# Patient Record
Sex: Female | Born: 1945
Health system: Southern US, Community
[De-identification: ages and names within clinical notes are randomized; demographics above are authoritative.]

## PROBLEM LIST (undated history)

## (undated) DIAGNOSIS — F1910 Other psychoactive substance abuse, uncomplicated: Secondary | ICD-10-CM

## (undated) DIAGNOSIS — F1021 Alcohol dependence, in remission: Secondary | ICD-10-CM

## (undated) DIAGNOSIS — Z803 Family history of malignant neoplasm of breast: Secondary | ICD-10-CM

## (undated) DIAGNOSIS — Z923 Personal history of irradiation: Secondary | ICD-10-CM

## (undated) DIAGNOSIS — I10 Essential (primary) hypertension: Secondary | ICD-10-CM

## (undated) DIAGNOSIS — F32A Depression, unspecified: Secondary | ICD-10-CM

## (undated) DIAGNOSIS — M719 Bursopathy, unspecified: Secondary | ICD-10-CM

## (undated) DIAGNOSIS — H269 Unspecified cataract: Secondary | ICD-10-CM

## (undated) DIAGNOSIS — R112 Nausea with vomiting, unspecified: Secondary | ICD-10-CM

## (undated) DIAGNOSIS — F419 Anxiety disorder, unspecified: Secondary | ICD-10-CM

## (undated) DIAGNOSIS — G20A1 Parkinson's disease without dyskinesia, without mention of fluctuations: Secondary | ICD-10-CM

## (undated) DIAGNOSIS — G709 Myoneural disorder, unspecified: Secondary | ICD-10-CM

## (undated) DIAGNOSIS — Z8719 Personal history of other diseases of the digestive system: Secondary | ICD-10-CM

## (undated) DIAGNOSIS — T7840XA Allergy, unspecified, initial encounter: Secondary | ICD-10-CM

## (undated) DIAGNOSIS — G20 Parkinson's disease: Secondary | ICD-10-CM

## (undated) DIAGNOSIS — Z801 Family history of malignant neoplasm of trachea, bronchus and lung: Secondary | ICD-10-CM

## (undated) DIAGNOSIS — Z9889 Other specified postprocedural states: Secondary | ICD-10-CM

## (undated) DIAGNOSIS — T8859XA Other complications of anesthesia, initial encounter: Secondary | ICD-10-CM

## (undated) DIAGNOSIS — C50919 Malignant neoplasm of unspecified site of unspecified female breast: Secondary | ICD-10-CM

## (undated) DIAGNOSIS — T4145XA Adverse effect of unspecified anesthetic, initial encounter: Secondary | ICD-10-CM

## (undated) DIAGNOSIS — F329 Major depressive disorder, single episode, unspecified: Secondary | ICD-10-CM

## (undated) DIAGNOSIS — Z8659 Personal history of other mental and behavioral disorders: Secondary | ICD-10-CM

## (undated) HISTORY — DX: Major depressive disorder, single episode, unspecified: F32.9

## (undated) HISTORY — DX: Essential (primary) hypertension: I10

## (undated) HISTORY — DX: Family history of malignant neoplasm of trachea, bronchus and lung: Z80.1

## (undated) HISTORY — DX: Other psychoactive substance abuse, uncomplicated: F19.10

## (undated) HISTORY — DX: Parkinson's disease: G20

## (undated) HISTORY — DX: Depression, unspecified: F32.A

## (undated) HISTORY — DX: Parkinson's disease without dyskinesia, without mention of fluctuations: G20.A1

## (undated) HISTORY — DX: Personal history of other mental and behavioral disorders: Z86.59

## (undated) HISTORY — DX: Myoneural disorder, unspecified: G70.9

## (undated) HISTORY — DX: Family history of malignant neoplasm of breast: Z80.3

## (undated) HISTORY — DX: Unspecified cataract: H26.9

## (undated) HISTORY — DX: Alcohol dependence, in remission: F10.21

## (undated) HISTORY — DX: Allergy, unspecified, initial encounter: T78.40XA

## (undated) HISTORY — PX: OVARIAN CYST REMOVAL: SHX89

## (undated) HISTORY — PX: TONSILLECTOMY: SUR1361

---
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
## 2005-11-02 DIAGNOSIS — F1021 Alcohol dependence, in remission: Secondary | ICD-10-CM

## 2005-11-02 HISTORY — DX: Alcohol dependence, in remission: F10.21

## 2010-12-05 LAB — HM PAP SMEAR: HM Pap smear: NEGATIVE

## 2012-11-02 DIAGNOSIS — Z8659 Personal history of other mental and behavioral disorders: Secondary | ICD-10-CM

## 2012-11-02 HISTORY — DX: Personal history of other mental and behavioral disorders: Z86.59

## 2015-06-21 LAB — HM PAP SMEAR: HM Pap smear: NEGATIVE

## 2015-06-25 LAB — TSH: TSH: 3.52 (ref 0.41–5.90)

## 2017-06-02 LAB — LIPID PANEL
Cholesterol: 168 (ref 0–200)
HDL: 47 (ref 35–70)
LDL Cholesterol: 103
Triglycerides: 88 (ref 40–160)

## 2017-06-02 LAB — CBC AND DIFFERENTIAL
HCT: 43 (ref 36–46)
Hemoglobin: 14.8 (ref 12.0–16.0)
Platelets: 210 (ref 150–399)
WBC: 5.5

## 2017-06-02 LAB — BASIC METABOLIC PANEL WITH GFR
BUN: 95 — AB (ref 4–21)
Creatinine: 0.8 (ref 0.5–1.1)
Glucose: 95
Potassium: 3.7 (ref 3.4–5.3)
Sodium: 138 (ref 137–147)

## 2017-06-02 LAB — TSH: TSH: 3.51 (ref 0.41–5.90)

## 2017-06-02 LAB — HEPATIC FUNCTION PANEL
Alkaline Phosphatase: 45 (ref 25–125)
Bilirubin, Total: 0.7

## 2017-06-28 LAB — COLOGUARD: Cologuard: NEGATIVE

## 2018-01-26 ENCOUNTER — Other Ambulatory Visit: Payer: Self-pay | Admitting: Internal Medicine

## 2018-01-26 DIAGNOSIS — R1900 Intra-abdominal and pelvic swelling, mass and lump, unspecified site: Secondary | ICD-10-CM

## 2018-01-31 HISTORY — PX: MASS EXCISION: SHX2000

## 2018-02-03 ENCOUNTER — Ambulatory Visit
Admission: RE | Admit: 2018-02-03 | Discharge: 2018-02-03 | Disposition: A | Discharge status: Home / Self Care | Payer: Medicare Other | Source: Ambulatory Visit | Attending: Internal Medicine | Admitting: Internal Medicine

## 2018-02-03 DIAGNOSIS — R1900 Intra-abdominal and pelvic swelling, mass and lump, unspecified site: Secondary | ICD-10-CM

## 2018-02-03 MED ORDER — IOPAMIDOL (ISOVUE-300) INJECTION 61%
100.0000 mL | Freq: Once | INTRAVENOUS | Status: AC | PRN
  Administered 2018-02-03: 100 mL via INTRAVENOUS

## 2018-02-04 ENCOUNTER — Telehealth: Payer: Self-pay | Admitting: *Deleted

## 2018-02-04 NOTE — Telephone Encounter (Signed)
Called and spoke with the patient, scheduled an appt for Monday at 3pm

## 2018-02-07 ENCOUNTER — Encounter: Payer: Self-pay | Admitting: Obstetrics

## 2018-02-07 ENCOUNTER — Inpatient Hospital Stay: Payer: Medicare Other | Attending: Obstetrics | Admitting: Obstetrics

## 2018-02-07 ENCOUNTER — Other Ambulatory Visit: Payer: Self-pay | Admitting: Obstetrics & Gynecology

## 2018-02-07 DIAGNOSIS — R6881 Early satiety: Secondary | ICD-10-CM | POA: Diagnosis not present

## 2018-02-07 DIAGNOSIS — N6310 Unspecified lump in the right breast, unspecified quadrant: Secondary | ICD-10-CM | POA: Insufficient documentation

## 2018-02-07 DIAGNOSIS — R1900 Intra-abdominal and pelvic swelling, mass and lump, unspecified site: Secondary | ICD-10-CM | POA: Insufficient documentation

## 2018-02-07 DIAGNOSIS — K5900 Constipation, unspecified: Secondary | ICD-10-CM | POA: Insufficient documentation

## 2018-02-07 DIAGNOSIS — Z79899 Other long term (current) drug therapy: Secondary | ICD-10-CM | POA: Insufficient documentation

## 2018-02-07 DIAGNOSIS — F1021 Alcohol dependence, in remission: Secondary | ICD-10-CM | POA: Insufficient documentation

## 2018-02-07 DIAGNOSIS — Z8659 Personal history of other mental and behavioral disorders: Secondary | ICD-10-CM | POA: Insufficient documentation

## 2018-02-07 DIAGNOSIS — Z87898 Personal history of other specified conditions: Secondary | ICD-10-CM | POA: Insufficient documentation

## 2018-02-07 DIAGNOSIS — R978 Other abnormal tumor markers: Secondary | ICD-10-CM | POA: Insufficient documentation

## 2018-02-07 DIAGNOSIS — N83201 Unspecified ovarian cyst, right side: Secondary | ICD-10-CM | POA: Insufficient documentation

## 2018-02-07 NOTE — Patient Instructions (Signed)
Preparing for your Surgery  Plan for surgery on February 10, 2018 with Dr. Precious Haws at [facility] will be scheduled for an exploratory laparotomy, bilateral salpingo-oophorectomy, possible total abdominal hysterectomy, possible staging, possible appendectomy.  Pre-operative Testing -You will receive a phone call from presurgical testing at [facility] to arrange for a pre-operative testing appointment before your surgery.  This appointment normally occurs one to two weeks before your scheduled surgery.    -Bring your insurance card, copy of an advanced directive if applicable, medication list  -At that visit, you will be asked to sign a consent for a possible blood transfusion in case a transfusion becomes necessary during surgery.  The need for a blood transfusion is rare but having consent is a necessary part of your care.     -You should not be taking blood thinners or aspirin at least ten days prior to surgery unless instructed by your surgeon.  Day Before Surgery at [place] will be asked to take in a light diet the day before surgery.  Avoid carbonated beverages.  You will be advised to have nothing to eat or drink after midnight the evening before.    Eat a light diet the day before surgery.  Examples including soups, broths, toast, yogurt, mashed potatoes.  Things to avoid include carbonated beverages (fizzy beverages), raw fruits and raw vegetables, or beans.   If your bowels are filled with gas, your surgeon will have difficulty visualizing your pelvic organs which increases your surgical risks.  Your role in recovery Your role is to become active as soon as directed by your doctor, while still giving yourself time to heal.  Rest when you feel tired. You will be asked to do the following in order to speed your recovery:  - Cough and breathe deeply. This helps toclear and expand your lungs and can prevent pneumonia. You may be given a  spirometer to practice deep breathing. A staff member will show you how to use the spirometer. - Do mild physical activity. Walking or moving your legs help your circulation and body functions return to normal. A staff member will help you when you try to walk and will provide you with simple exercises. Do not try to get up or walk alone the first time. - Actively manage your pain. Managing your pain lets you move in comfort. We will ask you to rate your pain on a scale of zero to 10. It is your responsibility to tell your doctor or nurse where and how much you hurt so your pain can be treated.  Special Considerations -If you are diabetic, you may be placed on insulin after surgery to have closer control over your blood sugars to promote healing and recovery.  This does not mean that you will be discharged on insulin.  If applicable, your oral antidiabetics will be resumed when you are tolerating a solid diet.  -Your final pathology results from surgery should be available by the Friday after surgery and the results will be relayed to you when available.  -Dr. Lahoma Crocker is the Surgeon that assists your GYN Oncologist with surgery.  The next day after your surgery you will either see your GYN Oncologist or Dr. Lahoma Crocker.   Blood Transfusion Information WHAT IS A BLOOD TRANSFUSION? A transfusion is the replacement of blood or some of its parts. Blood is made up of multiple cells which provide different functions.  Red blood cells carry oxygen and are  used for blood loss replacement.  White blood cells fight against infection.  Platelets control bleeding.  Plasma helps clot blood.  Other blood products are available for specialized needs, such as hemophilia or other clotting disorders. BEFORE THE TRANSFUSION  Who gives blood for transfusions?   You may be able to donate blood to be used at a later date on yourself (autologous donation).  Relatives can be asked to donate  blood. This is generally not any safer than if you have received blood from a stranger. The same precautions are taken to ensure safety when a relative's blood is donated.  Healthy volunteers who are fully evaluated to make sure their blood is safe. This is blood bank blood. Transfusion therapy is the safest it has ever been in the practice of medicine. Before blood is taken from a donor, a complete history is taken to make sure that person has no history of diseases nor engages in risky social behavior (examples are intravenous drug use or sexual activity with multiple partners). The donor's travel history is screened to minimize risk of transmitting infections, such as malaria. The donated blood is tested for signs of infectious diseases, such as HIV and hepatitis. The blood is then tested to be sure it is compatible with you in order to minimize the chance of a transfusion reaction. If you or a relative donates blood, this is often done in anticipation of surgery and is not appropriate for emergency situations. It takes many days to process the donated blood. RISKS AND COMPLICATIONS Although transfusion therapy is very safe and saves many lives, the main dangers of transfusion include:   Getting an infectious disease.  Developing a transfusion reaction. This is an allergic reaction to something in the blood you were given. Every precaution is taken to prevent this. The decision to have a blood transfusion has been considered carefully by your caregiver before blood is given. Blood is not given unless the benefits outweigh the risks.

## 2018-02-07 NOTE — Progress Notes (Signed)
Consult Note: Gyn-Onc  Consult was requested by Dr. Benjie Karvonen for the evaluation of Kendra [name] 72 y.o. female  CC:  Chief Complaint  Patient presents with  . Ovarian Cyst    HPI: Kendra [name]  is a very nice 72 y.o.  P0  She was in her usual state of health until about 3 weeks prior to presentation.  She was previously very active involving herself in yoga and walking but over 3 weeks noticed that this had become a little harder. She has been tiring easier and becoming relatively short of breath with activity.  She also notes that her abdomen was "swelling" and she felt like maybe she had ovarian cyst like she had had in the past.  There is for she followed up with her gynecologist Dr. Lisbeth Renshaw.  Workup by Dr. Lisbeth Renshaw including the exam and imaging along with tumor markers.  She presents with a CT scan and tumor marker results as follows:  CT abdomen pelvis with contrast 02/03/2018 at [facility] imaging revealed a right breast mass 2.6 cm and then an abdominal pelvic mass measuring 28.5 x 17 x 25 cm with enhancing septations and solid nodularity originating within the pelvis and extending into the upper abdomen favored to represent an ovarian mass.  01/26/2018 Ca1 25 equals 48.2 with upper limit normal being 38, [name] 4.7 with normal being up to 4.7 but for non-smoker being less than 3.9.  Also CA 19-9 which was markedly elevated at 322 with upper normal being 35.  She has noted some abdominal pain accompanying her diagnosis noting that this moves mostly on the right side she has occasional right lower quadrant mid right side and right upper quadrant.  She occasionally feels discomfort mid epigastric mostly after she eats.  She notices that the pain is worsened by position.  She has had some constipation and early satiety along with some reflux.  She claims to have a good appetite but is not eating as much due to the discomfort from the large mass.  She denies nausea and vomiting.  On her paperwork  she wrote chest pain but when I asked her where that is physically she pointed to her mid upper abdominal region.  Note that she has a history of a psychotic breakdown when she had some issues with her sleep and she has a previous history of alcoholism.  It is been greater than 12 years since she drank alcohol.  She was admitted for a psychotic breakdown 72 years ago and has not had no issues since she has been weaning off of medications to help with this and requires her "pharma GABA" to help her sleep at night currently.  Measurement of disease:  No results for input(s): CA125, CAN125, [name], CA199, ESTRADIOL, INHBB in the last 8760 hours.  Invalid input(s): INHIBINA  . Pending surgical findings   Radiology: . CT abdomen and pelvis as noted above 02/03/2018    Oncologic History: Pending surgical findings    No history exists.    Current Meds:  Outpatient Encounter Medications as of 02/07/2018  Medication Sig  . buPROPion (WELLBUTRIN XL) 300 MG 24 hr tablet Take 300 mg by mouth daily.  Marland Kitchen FLUoxetine (PROZAC) 20 MG capsule Take 20 mg by mouth daily.  Marland Kitchen lisinopril-hydrochlorothiazide (PRINZIDE,ZESTORETIC) 10-12.5 MG tablet Take 1 tablet by mouth daily.  Marland Kitchen Fife GABA sleep med at bedtime for sleep per pt.   No facility-administered encounter medications on file as of 02/07/2018.  Allergy:  Allergies  Allergen Reactions  . Hydroxyzine Anaphylaxis    Tongue swollen    Social Hx:   Social History   Socioeconomic History  . Marital status: Married    Spouse name: Not on file  . Number of children: Not on file  . Years of education: Not on file  . Highest education level: Not on file  Occupational History  . Not on file  Social Needs  . Financial resource strain: Not on file  . Food insecurity:    Worry: Not on file    Inability: Not on file  . Transportation needs:    Medical: Not on file    Non-medical: Not on file  Tobacco Use  .  Smoking status: Never Smoker  . Smokeless tobacco: Never Used  Substance and Sexual Activity  . Alcohol use: Not Currently    Frequency: Never    Comment: alcohol dependence prior to 2007  . Drug use: Never  . Sexual activity: Yes  Lifestyle  . Physical activity:    Days per week: Not on file    Minutes per session: Not on file  . Stress: Not on file  Relationships  . Social connections:    Talks on phone: Not on file    Gets together: Not on file    Attends religious service: Not on file    Active member of club or organization: Not on file    Attends meetings of clubs or organizations: Not on file    Relationship status: Not on file  . Intimate partner violence:    Fear of current or ex partner: Not on file    Emotionally abused: Not on file    Physically abused: Not on file    Forced sexual activity: Not on file  Other Topics Concern  . Not on file  Social History Narrative  . Not on file    Past Surgical Hx:  Past Surgical History:  Procedure Laterality Date  . OVARIAN CYST REMOVAL    . TONSILLECTOMY AND ADENOIDECTOMY  1953    Past Medical Hx:  Past Medical History:  Diagnosis Date  . Depression   . History of alcohol dependence ([place]) 2007   no ETOH; resolved since 2007  . History of psychosis 2014   due to sleep disturbance  . Hypertension     Past Gynecological History:   GYNECOLOGIC HISTORY:  No LMP recorded. Menarche: 72 years old P 0 LMP 72yo HRT none  Claims last normal Pap 2016 and got a phone call. States no abnormal pap smears.  Family Hx:  Family History  Problem Relation Age of Onset  . Diabetes Mother   . Breast cancer Mother 47  . Hypertension Mother   . Lung cancer Father        asbestos related  . Heart disease Brother   . Breast cancer Cousin 103       paternal cousin    Review of Systems:  Review of Systems  Constitutional: Positive for malaise/fatigue.  HENT: Negative.   Eyes: Negative.   Respiratory: Positive for  shortness of breath.   Cardiovascular: Negative.   Gastrointestinal: Positive for constipation and heartburn.  Genitourinary: Negative.   Musculoskeletal: Negative.   Skin: Positive for rash.  Neurological: Negative.   Endo/Heme/Allergies: Negative.   Psychiatric/Behavioral: Negative.    SOB as per HPI. "rash" on right lower abdomen. Neg GYN complaints.  Vitals:  Blood pressure (!) 127/59, pulse 85, temperature 98.4 F (36.9 C), temperature  source Oral, resp. rate 18, height 5\' 6"  (1.676 m), weight 191 lb 14.4 oz (87 kg), SpO2 99 %. Body mass index is 30.97 kg/m.   Physical Exam: ECOG PERFORMANCE STATUS: 1 - Symptomatic but completely ambulatory   General :  Well developed, 72 y.o., female in no apparent distress HEENT:  Normocephalic/atraumatic, symmetric, EOMI, eyelids normal Neck:   Supple, no masses.  Lymphatics:  No cervical/ submandibular/ supraclavicular/ infraclavicular/ inguinal adenopathy Respiratory:  Respirations unlabored, no use of accessory muscles CV:   Deferred Breast:  Deferred Musculoskeletal: No CVA tenderness, normal muscle strength. Abdomen:  Soft, non-tender distended by large mass. No evidence of hernia.  Extremities:  No lymphedema, no erythema, non-tender. Skin:   Petechial type rash right lower quadrant.  Multiple keratoses versus other dermal lesions. Neuro/Psych:  No focal motor deficit, no abnormal mental status. Normal gait. Normal affect. Alert and oriented to person, place, and time  Genito Urinary: Vulva: Normal external female genitalia.  Bladder/urethra: Urethral meatus normal in size and location. No lesions or   masses, well supported bladder Speculum exam: Vagina: No lesion, no discharge, no bleeding. Cervix: Normal appearing, no lesions. Bimanual exam: Some fullness in the cul-de-sac difficult to delineate mass given the large size.  The mass is extending all the way to the abdomen on the abdominal portion of the bimanual exam. Uterus:  Unable to determine size or mobility   Adnexa: No areas very specific nodular lesion can be palpated however again a large mass is palpated on abdominal pelvic exam. Rectovaginal:  Good tone, no impinging masses, no cul de sac nodularity, no parametrial involvement or nodularity.  Oncologic Summary: 1. Pending surgery   Assessment/Plan: 1. Breast mass o I encouraged her to move forward with scheduling workup.  On my review of the imaging this does not appear to be a benign cyst.  I told her to schedule a biopsied about a week after our planned surgery which I am hoping we get her scheduled in 3 days. o I do not think the breast mass has any direct relationship to the ovarian mass other than possible genetics 2. Pelvic abdominal mass o Likely this is adnexal in origin o We discussed possible etiologies including benign, borderline, invasive malignancies o We discussed her elevated tumor markers o Recommendation is for surgical resection in order to provide some relief from the physical symptoms she is experiencing in addition to providing pathologic diagnosis.  Alternative of observation is not recommended nor is she interested. o She did ask me about laparoscopic removal and I recommended we proceed with an abdominal incision explaining why we do not like to drain ovarian masses into the abdominal cavity. o Surgical sketch was reviewed along with the risks including but not limited to bowel bladder vascular ureteral and nerve injury.  o We discussed possible staging and risks associated with lymphadenectomy. o Lastly we discussed the possibility of adjuvant therapy should a malignant diagnosis occur. 3. She will return to see me in approximately 10-14 days to review the pathology 4. I encouraged her to bring her home medication given that her inability to sleep without medication is very important we want to make sure that that is started in the postoperative period so she does not have a  psychotic breakdown. 5. She is to meet with Melissa today to do a brief preoperative visit although I did discuss length of surgery length of stay and basic postoperative activity expectations. 6. She was in the office today with her husband  and both of their questions were answered to the best my ability and they seem satisfied with the plan of care at the end the visit.   Isabel Caprice, MD  02/07/2018, 4:57 PM  Cc: Dr. Evert Kohl. Benjie Karvonen, Referring Ob/Gyn

## 2018-02-07 NOTE — Patient Instructions (Addendum)
Kendra [name]  02/07/2018   Your procedure is scheduled on: 02/10/2018    Report to [facility] Main  Entrance     Report to admitting at 1030 AM   Call this number if you have problems the morning of surgery [phone]   Remember: Do not eat food or drink liquids :After Midnight.    Eat a light diet the day before Surgery.  Examples include: soups, broths, toast, yogurt and mashed potatoes. Things to avoid include: Carbonated Beverages, raw fruits and vegetables and beans.       Take these medicines the morning of surgery with A SIP OF WATER: Wellbutrin, and Prozac                                You may not have any metal on your body including hair pins and              piercings  Do not wear jewelry, make-up, lotions, powders or perfumes, deodorant             Do not wear nail polish.  Do not shave  48 hours prior to surgery.               Do not bring valuables to the hospital. [place].  Contacts, dentures or bridgework may not be worn into surgery.  Leave suitcase in the car. After surgery it may be brought to your room.       Special Instructions: Coughing and Deep breathing exercises, leg exercises              Please read over the following fact sheets you were given: _____________________________________________________________________             [facility] - Preparing for Surgery Before surgery, you can play an important role.  Because skin is not sterile, your skin needs to be as free of germs as possible.  You can reduce the number of germs on your skin by washing with CHG (chlorahexidine gluconate) soap before surgery.  CHG is an antiseptic cleaner which kills germs and bonds with the skin to continue killing germs even after washing. Please DO NOT use if you have an allergy to CHG or antibacterial soaps.  If your skin becomes reddened/irritated stop using the CHG and inform your nurse  when you arrive at Short Stay. Do not shave (including legs and underarms) for at least 48 hours prior to the first CHG shower.  You may shave your face/neck. Please follow these instructions carefully:  1.  Shower with CHG Soap the night before surgery and the  morning of Surgery.  2.  If you choose to wash your hair, wash your hair first as usual with your  normal  shampoo.  3.  After you shampoo, rinse your hair and body thoroughly to remove the  shampoo.                           4.  Use CHG as you would any other liquid soap.  You can apply chg directly  to the skin and wash  Gently with a scrungie or clean washcloth.  5.  Apply the CHG Soap to your body ONLY FROM THE NECK DOWN.   Do not use on face/ open                           Wound or open sores. Avoid contact with eyes, ears mouth and genitals (private parts).                       Wash face,  Genitals (private parts) with your normal soap.             6.  Wash thoroughly, paying special attention to the area where your surgery  will be performed.  7.  Thoroughly rinse your body with warm water from the neck down.  8.  DO NOT shower/wash with your normal soap after using and rinsing off  the CHG Soap.                9.  Pat yourself dry with a clean towel.            10.  Wear clean pajamas.            11.  Place clean sheets on your bed the night of your first shower and do not  sleep with pets. Day of Surgery : Do not apply any lotions/deodorants the morning of surgery.  Please wear clean clothes to the hospital/surgery center.  FAILURE TO FOLLOW THESE INSTRUCTIONS MAY RESULT IN THE CANCELLATION OF YOUR SURGERY PATIENT SIGNATURE_________________________________  NURSE SIGNATURE__________________________________  ________________________________________________________________________  WHAT IS A BLOOD TRANSFUSION? Blood Transfusion Information  A transfusion is the replacement of blood or some of its  parts. Blood is made up of multiple cells which provide different functions.  Red blood cells carry oxygen and are used for blood loss replacement.  White blood cells fight against infection.  Platelets control bleeding.  Plasma helps clot blood.  Other blood products are available for specialized needs, such as hemophilia or other clotting disorders. BEFORE THE TRANSFUSION  Who gives blood for transfusions?   Healthy volunteers who are fully evaluated to make sure their blood is safe. This is blood bank blood. Transfusion therapy is the safest it has ever been in the practice of medicine. Before blood is taken from a donor, a complete history is taken to make sure that person has no history of diseases nor engages in risky social behavior (examples are intravenous drug use or sexual activity with multiple partners). The donor's travel history is screened to minimize risk of transmitting infections, such as malaria. The donated blood is tested for signs of infectious diseases, such as HIV and hepatitis. The blood is then tested to be sure it is compatible with you in order to minimize the chance of a transfusion reaction. If you or a relative donates blood, this is often done in anticipation of surgery and is not appropriate for emergency situations. It takes many days to process the donated blood. RISKS AND COMPLICATIONS Although transfusion therapy is very safe and saves many lives, the main dangers of transfusion include:   Getting an infectious disease.  Developing a transfusion reaction. This is an allergic reaction to something in the blood you were given. Every precaution is taken to prevent this. The decision to have a blood transfusion has been considered carefully by your caregiver before blood is given. Blood is not given unless the benefits outweigh  the risks. AFTER THE TRANSFUSION  Right after receiving a blood transfusion, you will usually feel much better and more energetic.  This is especially true if your red blood cells have gotten low (anemic). The transfusion raises the level of the red blood cells which carry oxygen, and this usually causes an energy increase.  The nurse administering the transfusion will monitor you carefully for complications. HOME CARE INSTRUCTIONS  No special instructions are needed after a transfusion. You may find your energy is better. Speak with your caregiver about any limitations on activity for underlying diseases you may have. SEEK MEDICAL CARE IF:   Your condition is not improving after your transfusion.  You develop redness or irritation at the intravenous (IV) site. SEEK IMMEDIATE MEDICAL CARE IF:  Any of the following symptoms occur over the next 12 hours:  Shaking chills.  You have a temperature by mouth above 102 F (38.9 C), not controlled by medicine.  Chest, back, or muscle pain.  People around you feel you are not acting correctly or are confused.  Shortness of breath or difficulty breathing.  Dizziness and fainting.  You get a rash or develop hives.  You have a decrease in urine output.  Your urine turns a dark color or changes to pink, red, or brown. Any of the following symptoms occur over the next 10 days:  You have a temperature by mouth above 102 F (38.9 C), not controlled by medicine.  Shortness of breath.  Weakness after normal activity.  The white part of the eye turns yellow (jaundice).  You have a decrease in the amount of urine or are urinating less often.  Your urine turns a dark color or changes to pink, red, or brown. Document Released: 10/16/2000 Document Revised: 01/11/2012 Document Reviewed: 06/04/2008 ExitCare Patient Information 2014 [place].  _______________________________________________________________________  Incentive Spirometer  An incentive spirometer is a tool that can help keep your lungs clear and active. This tool measures how well you are filling  your lungs with each breath. Taking long deep breaths may help reverse or decrease the chance of developing breathing (pulmonary) problems (especially infection) following:  A long period of time when you are unable to move or be active. BEFORE THE PROCEDURE   If the spirometer includes an indicator to show your best effort, your nurse or respiratory therapist will set it to a desired goal.  If possible, sit up straight or lean slightly forward. Try not to slouch.  Hold the incentive spirometer in an upright position. INSTRUCTIONS FOR USE  1. Sit on the edge of your bed if possible, or sit up as far as you can in bed or on a chair. 2. Hold the incentive spirometer in an upright position. 3. Breathe out normally. 4. Place the mouthpiece in your mouth and seal your lips tightly around it. 5. Breathe in slowly and as deeply as possible, raising the piston or the ball toward the top of the column. 6. Hold your breath for 3-5 seconds or for as long as possible. Allow the piston or ball to fall to the bottom of the column. 7. Remove the mouthpiece from your mouth and breathe out normally. 8. Rest for a few seconds and repeat Steps 1 through 7 at least 10 times every 1-2 hours when you are awake. Take your time and take a few normal breaths between deep breaths. 9. The spirometer may include an indicator to show your best effort. Use the indicator as a goal to work  toward during each repetition. 10. After each set of 10 deep breaths, practice coughing to be sure your lungs are clear. If you have an incision (the cut made at the time of surgery), support your incision when coughing by placing a pillow or rolled up towels firmly against it. Once you are able to get out of bed, walk around indoors and cough well. You may stop using the incentive spirometer when instructed by your caregiver.  RISKS AND COMPLICATIONS  Take your time so you do not get dizzy or light-headed.  If you are in pain, you may  need to take or ask for pain medication before doing incentive spirometry. It is harder to take a deep breath if you are having pain. AFTER USE  Rest and breathe slowly and easily.  It can be helpful to keep track of a log of your progress. Your caregiver can provide you with a simple table to help with this. If you are using the spirometer at home, follow these instructions: [place] IF:   You are having difficultly using the spirometer.  You have trouble using the spirometer as often as instructed.  Your pain medication is not giving enough relief while using the spirometer.  You develop fever of 100.5 F (38.1 C) or higher. SEEK IMMEDIATE MEDICAL CARE IF:   You cough up bloody sputum that had not been present before.  You develop fever of 102 F (38.9 C) or greater.  You develop worsening pain at or near the incision site. MAKE SURE YOU:   Understand these instructions.  Will watch your condition.  Will get help right away if you are not doing well or get worse. Document Released: 03/01/2007 Document Revised: 01/11/2012 Document Reviewed: 05/02/2007 [facility] Patient Information 2014 [place], [place].   ________________________________________________________________________

## 2018-02-08 ENCOUNTER — Other Ambulatory Visit: Payer: Self-pay

## 2018-02-08 ENCOUNTER — Encounter (HOSPITAL_COMMUNITY)
Admission: RE | Admit: 2018-02-08 | Discharge: 2018-02-08 | Disposition: A | Discharge status: Home / Self Care | Payer: Medicare Other | Source: Ambulatory Visit | Attending: Obstetrics | Admitting: Obstetrics

## 2018-02-08 ENCOUNTER — Ambulatory Visit (HOSPITAL_COMMUNITY)
Admission: RE | Admit: 2018-02-08 | Discharge: 2018-02-08 | Disposition: A | Discharge status: Home / Self Care | Payer: Medicare Other | Source: Ambulatory Visit | Attending: Gynecologic Oncology | Admitting: Gynecologic Oncology

## 2018-02-08 ENCOUNTER — Encounter (HOSPITAL_COMMUNITY): Payer: Self-pay | Admitting: *Deleted

## 2018-02-08 DIAGNOSIS — Z01812 Encounter for preprocedural laboratory examination: Secondary | ICD-10-CM | POA: Insufficient documentation

## 2018-02-08 DIAGNOSIS — Z01810 Encounter for preprocedural cardiovascular examination: Secondary | ICD-10-CM | POA: Insufficient documentation

## 2018-02-08 DIAGNOSIS — K449 Diaphragmatic hernia without obstruction or gangrene: Secondary | ICD-10-CM

## 2018-02-08 DIAGNOSIS — R1900 Intra-abdominal and pelvic swelling, mass and lump, unspecified site: Secondary | ICD-10-CM

## 2018-02-08 DIAGNOSIS — Z01818 Encounter for other preprocedural examination: Secondary | ICD-10-CM

## 2018-02-08 DIAGNOSIS — I10 Essential (primary) hypertension: Secondary | ICD-10-CM

## 2018-02-08 HISTORY — DX: Other specified postprocedural states: Z98.890

## 2018-02-08 HISTORY — DX: Other complications of anesthesia, initial encounter: T88.59XA

## 2018-02-08 HISTORY — DX: Nausea with vomiting, unspecified: R11.2

## 2018-02-08 HISTORY — DX: Adverse effect of unspecified anesthetic, initial encounter: T41.45XA

## 2018-02-08 LAB — CBC WITH DIFFERENTIAL/PLATELET
BASOS ABS: 0 10*3/uL (ref 0.0–0.1)
BASOS PCT: 1 %
Eosinophils Absolute: 0.2 10*3/uL (ref 0.0–0.7)
Eosinophils Relative: 3 %
HEMATOCRIT: 39.6 % (ref 36.0–46.0)
Hemoglobin: 13.2 g/dL (ref 12.0–15.0)
Lymphocytes Relative: 16 %
Lymphs Abs: 1.3 10*3/uL (ref 0.7–4.0)
MCH: 28.9 pg (ref 26.0–34.0)
MCHC: 33.3 g/dL (ref 30.0–36.0)
MCV: 86.7 fL (ref 78.0–100.0)
MONO ABS: 1 10*3/uL (ref 0.1–1.0)
Monocytes Relative: 12 %
NEUTROS ABS: 5.4 10*3/uL (ref 1.7–7.7)
NEUTROS PCT: 68 %
Platelets: 323 10*3/uL (ref 150–400)
RBC: 4.57 MIL/uL (ref 3.87–5.11)
RDW: 13.3 % (ref 11.5–15.5)
WBC: 7.9 10*3/uL (ref 4.0–10.5)

## 2018-02-08 LAB — URINALYSIS, ROUTINE W REFLEX MICROSCOPIC
BILIRUBIN URINE: NEGATIVE
GLUCOSE, UA: NEGATIVE mg/dL
Ketones, ur: NEGATIVE mg/dL
NITRITE: NEGATIVE
PH: 7 (ref 5.0–8.0)
Protein, ur: NEGATIVE mg/dL
RBC / HPF: NONE SEEN RBC/hpf (ref 0–5)
SPECIFIC GRAVITY, URINE: 1.008 (ref 1.005–1.030)

## 2018-02-08 LAB — COMPREHENSIVE METABOLIC PANEL
ALBUMIN: 3.6 g/dL (ref 3.5–5.0)
ALT: 18 U/L (ref 14–54)
AST: 20 U/L (ref 15–41)
Alkaline Phosphatase: 38 U/L (ref 38–126)
Anion gap: 7 (ref 5–15)
BILIRUBIN TOTAL: 0.6 mg/dL (ref 0.3–1.2)
BUN: 8 mg/dL (ref 6–20)
CO2: 25 mmol/L (ref 22–32)
Calcium: 9.2 mg/dL (ref 8.9–10.3)
Chloride: 102 mmol/L (ref 101–111)
Creatinine, Ser: 0.78 mg/dL (ref 0.44–1.00)
GFR calc Af Amer: >60 mL/min (ref >60)
GFR calc non Af Amer: >60 mL/min (ref >60)
GLUCOSE: 86 mg/dL (ref 65–99)
POTASSIUM: 4.1 mmol/L (ref 3.5–5.1)
Sodium: 134 mmol/L — ABNORMAL LOW (ref 135–145)
TOTAL PROTEIN: 6.6 g/dL (ref 6.5–8.1)

## 2018-02-08 LAB — ABO/RH: ABO/RH(D): O NEG

## 2018-02-08 NOTE — Progress Notes (Signed)
02-08-18 UA result routed to Dr. Gerarda Fraction for review.

## 2018-02-09 ENCOUNTER — Telehealth: Payer: Self-pay

## 2018-02-09 NOTE — Telephone Encounter (Signed)
Ms Kendra [name] called to see if she could take ativan 0.5 mg  [employer] GABA for sleep this evening if she needs to. Told her that Joylene John, NP stated that she cannot take the [place] as it could interfere with the anesthesia. If she can do with out the ativan, that would be good.  If she takes the ativan tonight, she needs to let anesthesia know in the am that she took it and the dose. Pt verbalized understanding.

## 2018-02-10 ENCOUNTER — Inpatient Hospital Stay (HOSPITAL_COMMUNITY): Payer: Medicare Other | Admitting: Anesthesiology

## 2018-02-10 ENCOUNTER — Inpatient Hospital Stay (HOSPITAL_COMMUNITY)
Admission: RE | Admit: 2018-02-10 | Discharge: 2018-02-14 | DRG: 742 | Disposition: A | Discharge status: Home / Self Care | Payer: Medicare Other | Source: Ambulatory Visit | Attending: Obstetrics | Admitting: Obstetrics

## 2018-02-10 ENCOUNTER — Encounter (HOSPITAL_COMMUNITY)
Admission: RE | Disposition: A | Discharge status: Home / Self Care | Payer: Self-pay | Source: Ambulatory Visit | Attending: Obstetrics

## 2018-02-10 ENCOUNTER — Encounter (HOSPITAL_COMMUNITY): Payer: Self-pay | Admitting: *Deleted

## 2018-02-10 ENCOUNTER — Other Ambulatory Visit: Payer: Self-pay

## 2018-02-10 DIAGNOSIS — D270 Benign neoplasm of right ovary: Secondary | ICD-10-CM | POA: Diagnosis present

## 2018-02-10 DIAGNOSIS — F329 Major depressive disorder, single episode, unspecified: Secondary | ICD-10-CM | POA: Diagnosis present

## 2018-02-10 DIAGNOSIS — Z79899 Other long term (current) drug therapy: Secondary | ICD-10-CM | POA: Diagnosis not present

## 2018-02-10 DIAGNOSIS — Z6830 Body mass index (BMI) 30.0-30.9, adult: Secondary | ICD-10-CM | POA: Diagnosis not present

## 2018-02-10 DIAGNOSIS — R1900 Intra-abdominal and pelvic swelling, mass and lump, unspecified site: Secondary | ICD-10-CM | POA: Diagnosis present

## 2018-02-10 DIAGNOSIS — Z91013 Allergy to seafood: Secondary | ICD-10-CM

## 2018-02-10 DIAGNOSIS — I10 Essential (primary) hypertension: Secondary | ICD-10-CM | POA: Diagnosis present

## 2018-02-10 DIAGNOSIS — Z888 Allergy status to other drugs, medicaments and biological substances status: Secondary | ICD-10-CM | POA: Diagnosis not present

## 2018-02-10 DIAGNOSIS — E669 Obesity, unspecified: Secondary | ICD-10-CM | POA: Diagnosis present

## 2018-02-10 DIAGNOSIS — E871 Hypo-osmolality and hyponatremia: Secondary | ICD-10-CM | POA: Diagnosis not present

## 2018-02-10 HISTORY — PX: LAPAROTOMY: SHX154

## 2018-02-10 HISTORY — PX: SALPINGOOPHORECTOMY: SHX82

## 2018-02-10 LAB — TYPE AND SCREEN
ABO/RH(D): O NEG
Antibody Screen: NEGATIVE

## 2018-02-10 SURGERY — LAPAROTOMY, EXPLORATORY
Anesthesia: General

## 2018-02-10 MED ORDER — METOCLOPRAMIDE HCL 5 MG/ML IJ SOLN: 10.0000 mg | Freq: Once | INTRAMUSCULAR | Status: DC | PRN

## 2018-02-10 MED ORDER — PROPOFOL 10 MG/ML IV BOLUS
INTRAVENOUS | Status: AC
  Filled 2018-02-10: qty 20

## 2018-02-10 MED ORDER — HYDROMORPHONE HCL 1 MG/ML IJ SOLN: 0.5000 mg | INTRAMUSCULAR | Status: DC | PRN

## 2018-02-10 MED ORDER — BUPIVACAINE HCL 0.25 % IJ SOLN
INTRAMUSCULAR | Status: DC | PRN
Start: 2018-02-10 — End: 2018-02-10
  Administered 2018-02-10: 20 mL

## 2018-02-10 MED ORDER — MEPERIDINE HCL 50 MG/ML IJ SOLN: 6.2500 mg | INTRAMUSCULAR | Status: DC | PRN

## 2018-02-10 MED ORDER — FENTANYL CITRATE (PF) 100 MCG/2ML IJ SOLN
INTRAMUSCULAR | Status: DC | PRN
  Administered 2018-02-10: 50 ug via INTRAVENOUS
  Administered 2018-02-10: 100 ug via INTRAVENOUS
  Administered 2018-02-10 (×3): 50 ug via INTRAVENOUS

## 2018-02-10 MED ORDER — ALBUMIN HUMAN 5 % IV SOLN
INTRAVENOUS | Status: AC
  Filled 2018-02-10: qty 250

## 2018-02-10 MED ORDER — OXYCODONE HCL 5 MG PO TABS
5.0000 mg | ORAL_TABLET | ORAL | Status: DC | PRN
  Administered 2018-02-10 – 2018-02-12 (×6): 5 mg via ORAL
  Filled 2018-02-10 (×7): qty 1

## 2018-02-10 MED ORDER — SENNOSIDES-DOCUSATE SODIUM 8.6-50 MG PO TABS
2.0000 | ORAL_TABLET | Freq: Every day | ORAL | Status: DC
  Administered 2018-02-10 – 2018-02-13 (×4): 2 via ORAL
  Filled 2018-02-10 (×5): qty 2

## 2018-02-10 MED ORDER — SUGAMMADEX SODIUM 200 MG/2ML IV SOLN
INTRAVENOUS | Status: AC
  Filled 2018-02-10: qty 2

## 2018-02-10 MED ORDER — TRAMADOL HCL 50 MG PO TABS: 100.0000 mg | ORAL_TABLET | Freq: Two times a day (BID) | ORAL | Status: DC | PRN

## 2018-02-10 MED ORDER — DEXAMETHASONE SODIUM PHOSPHATE 10 MG/ML IJ SOLN
INTRAMUSCULAR | Status: DC | PRN
  Administered 2018-02-10: 5 mg via INTRAVENOUS

## 2018-02-10 MED ORDER — FLUOXETINE HCL 20 MG PO CAPS
20.0000 mg | ORAL_CAPSULE | Freq: Every day | ORAL | Status: DC
  Administered 2018-02-11 – 2018-02-14 (×4): 20 mg via ORAL
  Filled 2018-02-10 (×4): qty 1

## 2018-02-10 MED ORDER — IBUPROFEN 200 MG PO TABS: 600.0000 mg | ORAL_TABLET | Freq: Four times a day (QID) | ORAL | Status: DC

## 2018-02-10 MED ORDER — BUPIVACAINE HCL (PF) 0.25 % IJ SOLN
INTRAMUSCULAR | Status: AC
  Filled 2018-02-10: qty 30

## 2018-02-10 MED ORDER — 0.9 % SODIUM CHLORIDE (POUR BTL) OPTIME
TOPICAL | Status: DC | PRN
  Administered 2018-02-10: 3000 mL

## 2018-02-10 MED ORDER — FENTANYL CITRATE (PF) 250 MCG/5ML IJ SOLN
INTRAMUSCULAR | Status: AC
Start: 2018-02-10 — End: ?
  Filled 2018-02-10: qty 5

## 2018-02-10 MED ORDER — HYDROMORPHONE HCL 1 MG/ML IJ SOLN
INTRAMUSCULAR | Status: AC
  Administered 2018-02-10: 0.5 mg via INTRAVENOUS
  Filled 2018-02-10: qty 1

## 2018-02-10 MED ORDER — SODIUM CHLORIDE 0.9 % IV SOLN
2.0000 g | INTRAVENOUS | Status: AC
  Administered 2018-02-10: 2 g via INTRAVENOUS
  Filled 2018-02-10: qty 2

## 2018-02-10 MED ORDER — ROCURONIUM BROMIDE 10 MG/ML (PF) SYRINGE
PREFILLED_SYRINGE | INTRAVENOUS | Status: DC | PRN
  Administered 2018-02-10: 30 mg via INTRAVENOUS
  Administered 2018-02-10: 50 mg via INTRAVENOUS
  Administered 2018-02-10: 10 mg via INTRAVENOUS

## 2018-02-10 MED ORDER — HYDROCHLOROTHIAZIDE 12.5 MG PO CAPS
12.5000 mg | ORAL_CAPSULE | Freq: Every day | ORAL | Status: DC
  Administered 2018-02-11 – 2018-02-14 (×4): 12.5 mg via ORAL
  Filled 2018-02-10 (×4): qty 1

## 2018-02-10 MED ORDER — LIDOCAINE 2% (20 MG/ML) 5 ML SYRINGE
INTRAMUSCULAR | Status: DC | PRN
  Administered 2018-02-10: 60 mg via INTRAVENOUS

## 2018-02-10 MED ORDER — SCOPOLAMINE 1 MG/3DAYS TD PT72: 1.0000 | MEDICATED_PATCH | TRANSDERMAL | Status: DC

## 2018-02-10 MED ORDER — SUGAMMADEX SODIUM 200 MG/2ML IV SOLN
INTRAVENOUS | Status: DC | PRN
  Administered 2018-02-10: 350 mg via INTRAVENOUS

## 2018-02-10 MED ORDER — ENSURE ENLIVE PO LIQD
237.0000 mL | Freq: Two times a day (BID) | ORAL | Status: DC
  Administered 2018-02-10 – 2018-02-14 (×8): 237 mL via ORAL

## 2018-02-10 MED ORDER — ONDANSETRON HCL 4 MG/2ML IJ SOLN
INTRAMUSCULAR | Status: DC | PRN
  Administered 2018-02-10: 4 mg via INTRAVENOUS

## 2018-02-10 MED ORDER — STERILE WATER FOR IRRIGATION IR SOLN
Status: DC | PRN
  Administered 2018-02-10: 1000 mL

## 2018-02-10 MED ORDER — CHEWING GUM (ORBIT) SUGAR FREE
1.0000 | CHEWING_GUM | Freq: Three times a day (TID) | ORAL | Status: DC
  Administered 2018-02-10 – 2018-02-14 (×11): 1 via ORAL
  Filled 2018-02-10: qty 1

## 2018-02-10 MED ORDER — HYDROMORPHONE HCL 1 MG/ML IJ SOLN
0.2500 mg | INTRAMUSCULAR | Status: DC | PRN
  Administered 2018-02-10 (×2): 0.5 mg via INTRAVENOUS

## 2018-02-10 MED ORDER — PROPOFOL 10 MG/ML IV BOLUS
INTRAVENOUS | Status: DC | PRN
  Administered 2018-02-10: 30 mg via INTRAVENOUS
  Administered 2018-02-10: 140 mg via INTRAVENOUS

## 2018-02-10 MED ORDER — ENOXAPARIN SODIUM 40 MG/0.4ML ~~LOC~~ SOLN
40.0000 mg | SUBCUTANEOUS | Status: DC
  Administered 2018-02-11 – 2018-02-14 (×4): 40 mg via SUBCUTANEOUS
  Filled 2018-02-10 (×4): qty 0.4

## 2018-02-10 MED ORDER — BUPIVACAINE LIPOSOME 1.3 % IJ SUSP
20.0000 mL | Freq: Once | INTRAMUSCULAR | Status: DC
  Filled 2018-02-10: qty 20

## 2018-02-10 MED ORDER — ONDANSETRON HCL 4 MG PO TABS: 4.0000 mg | ORAL_TABLET | Freq: Four times a day (QID) | ORAL | Status: DC | PRN

## 2018-02-10 MED ORDER — LISINOPRIL 20 MG PO TABS
20.0000 mg | ORAL_TABLET | Freq: Every day | ORAL | Status: DC
  Administered 2018-02-11 – 2018-02-14 (×4): 20 mg via ORAL
  Filled 2018-02-10 (×4): qty 1

## 2018-02-10 MED ORDER — PHENYLEPHRINE 40 MCG/ML (10ML) SYRINGE FOR IV PUSH (FOR BLOOD PRESSURE SUPPORT)
PREFILLED_SYRINGE | INTRAVENOUS | Status: DC | PRN
  Administered 2018-02-10: 80 ug via INTRAVENOUS

## 2018-02-10 MED ORDER — ORAL CARE MOUTH RINSE
15.0000 mL | Freq: Two times a day (BID) | OROMUCOSAL | Status: DC
  Administered 2018-02-10 – 2018-02-14 (×4): 15 mL via OROMUCOSAL

## 2018-02-10 MED ORDER — SODIUM CHLORIDE 0.9 % IJ SOLN
INTRAMUSCULAR | Status: DC | PRN
  Administered 2018-02-10: 100 mL

## 2018-02-10 MED ORDER — SODIUM CHLORIDE 0.9 % IJ SOLN
INTRAMUSCULAR | Status: AC
  Filled 2018-02-10: qty 20

## 2018-02-10 MED ORDER — SODIUM CHLORIDE 0.9 % IJ SOLN
INTRAMUSCULAR | Status: AC
  Filled 2018-02-10: qty 100

## 2018-02-10 MED ORDER — NON FORMULARY: 1.0000 [IU] | Freq: Three times a day (TID) | Status: DC

## 2018-02-10 MED ORDER — ALBUMIN HUMAN 5 % IV SOLN
12.5000 g | Freq: Once | INTRAVENOUS | Status: AC
  Administered 2018-02-10: 12.5 g via INTRAVENOUS

## 2018-02-10 MED ORDER — BUPIVACAINE LIPOSOME 1.3 % IJ SUSP
INTRAMUSCULAR | Status: DC | PRN
  Administered 2018-02-10: 20 mL

## 2018-02-10 MED ORDER — LISINOPRIL-HYDROCHLOROTHIAZIDE 20-12.5 MG PO TABS: 1.0000 | ORAL_TABLET | Freq: Every day | ORAL | Status: DC

## 2018-02-10 MED ORDER — DEXAMETHASONE SODIUM PHOSPHATE 10 MG/ML IJ SOLN
INTRAMUSCULAR | Status: AC
  Filled 2018-02-10: qty 1

## 2018-02-10 MED ORDER — FENTANYL CITRATE (PF) 250 MCG/5ML IJ SOLN
INTRAMUSCULAR | Status: AC
  Filled 2018-02-10: qty 5

## 2018-02-10 MED ORDER — SUGAMMADEX SODIUM 500 MG/5ML IV SOLN
INTRAVENOUS | Status: AC
  Filled 2018-02-10: qty 5

## 2018-02-10 MED ORDER — PREGABALIN 75 MG PO CAPS
75.0000 mg | ORAL_CAPSULE | Freq: Two times a day (BID) | ORAL | Status: DC
  Administered 2018-02-11 – 2018-02-12 (×4): 75 mg via ORAL
  Filled 2018-02-10 (×4): qty 1

## 2018-02-10 MED ORDER — LACTATED RINGERS IV SOLN
INTRAVENOUS | Status: DC
  Administered 2018-02-10 (×3): via INTRAVENOUS

## 2018-02-10 MED ORDER — LORAZEPAM 0.5 MG PO TABS: 0.5000 mg | ORAL_TABLET | Freq: Every day | ORAL | Status: DC | PRN

## 2018-02-10 MED ORDER — BUPROPION HCL ER (XL) 300 MG PO TB24
300.0000 mg | ORAL_TABLET | Freq: Every day | ORAL | Status: DC
  Administered 2018-02-11 – 2018-02-14 (×4): 300 mg via ORAL
  Filled 2018-02-10 (×4): qty 1

## 2018-02-10 MED ORDER — ONDANSETRON HCL 4 MG/2ML IJ SOLN: 4.0000 mg | Freq: Four times a day (QID) | INTRAMUSCULAR | Status: DC | PRN

## 2018-02-10 MED ORDER — ACETAMINOPHEN 500 MG PO TABS
1000.0000 mg | ORAL_TABLET | Freq: Two times a day (BID) | ORAL | Status: DC
  Administered 2018-02-10 – 2018-02-12 (×5): 1000 mg via ORAL
  Filled 2018-02-10 (×5): qty 2

## 2018-02-10 MED ORDER — PHENYLEPHRINE 40 MCG/ML (10ML) SYRINGE FOR IV PUSH (FOR BLOOD PRESSURE SUPPORT)
PREFILLED_SYRINGE | INTRAVENOUS | Status: AC
  Filled 2018-02-10: qty 30

## 2018-02-10 MED ORDER — ONDANSETRON HCL 4 MG/2ML IJ SOLN
INTRAMUSCULAR | Status: AC
  Filled 2018-02-10: qty 2

## 2018-02-10 MED ORDER — KCL IN DEXTROSE-NACL 20-5-0.45 MEQ/L-%-% IV SOLN
INTRAVENOUS | Status: DC
  Administered 2018-02-10: 19:00:00 via INTRAVENOUS
  Filled 2018-02-10: qty 1000

## 2018-02-10 SURGICAL SUPPLY — 77 items
ATTRACTOMAT 16X20 MAGNETIC DRP (DRAPES) ×3 IMPLANT
BENZOIN TINCTURE PRP APPL 2/3 (GAUZE/BANDAGES/DRESSINGS) ×3 IMPLANT
BLADE EXTENDED COATED 6.5IN (ELECTRODE) ×3 IMPLANT
CELLS DAT CNTRL 66122 CELL SVR (MISCELLANEOUS) IMPLANT
CHLORAPREP W/TINT 26ML (MISCELLANEOUS) ×3 IMPLANT
CLIP VESOCCLUDE LG 6/CT (CLIP) ×3 IMPLANT
CLIP VESOCCLUDE MED 6/CT (CLIP) ×3 IMPLANT
CLIP VESOCCLUDE MED LG 6/CT (CLIP) ×3 IMPLANT
CONT SPEC 4OZ CLIKSEAL STRL BL (MISCELLANEOUS) ×3 IMPLANT
DERMABOND ADVANCED (GAUZE/BANDAGES/DRESSINGS)
DERMABOND ADVANCED .7 DNX12 (GAUZE/BANDAGES/DRESSINGS) IMPLANT
DRAPE INCISE IOBAN 66X45 STRL (DRAPES) IMPLANT
DRAPE UNDERBUTTOCKS STRL (DRAPE) ×3 IMPLANT
DRAPE WARM FLUID 44X44 (DRAPE) ×3 IMPLANT
DRSG OPSITE POSTOP 4X10 (GAUZE/BANDAGES/DRESSINGS) IMPLANT
DRSG OPSITE POSTOP 4X12 (GAUZE/BANDAGES/DRESSINGS) ×3 IMPLANT
DRSG OPSITE POSTOP 4X6 (GAUZE/BANDAGES/DRESSINGS) IMPLANT
DRSG OPSITE POSTOP 4X8 (GAUZE/BANDAGES/DRESSINGS) IMPLANT
ELECT REM PT RETURN 15FT ADLT (MISCELLANEOUS) ×3 IMPLANT
GAUZE SPONGE 4X4 12PLY STRL (GAUZE/BANDAGES/DRESSINGS) ×3 IMPLANT
GAUZE SPONGE 4X4 16PLY XRAY LF (GAUZE/BANDAGES/DRESSINGS) ×3 IMPLANT
GLOVE BIO SURGEON STRL SZ 6 (GLOVE) ×6 IMPLANT
GLOVE BIO SURGEON STRL SZ 6.5 (GLOVE) ×6 IMPLANT
GLOVE BIOGEL PI IND STRL 7.0 (GLOVE) ×2 IMPLANT
GLOVE BIOGEL PI INDICATOR 7.0 (GLOVE) ×1
GLOVE SURG SS PI 6.5 STRL IVOR (GLOVE) ×3 IMPLANT
GOWN STRL REUS W/ TWL LRG LVL3 (GOWN DISPOSABLE) ×4 IMPLANT
GOWN STRL REUS W/TWL LRG LVL3 (GOWN DISPOSABLE) ×5 IMPLANT
HEMOSTAT ARISTA ABSORB 3G PWDR (MISCELLANEOUS) IMPLANT
KIT BASIN OR (CUSTOM PROCEDURE TRAY) ×3 IMPLANT
LOOP VESSEL MAXI BLUE (MISCELLANEOUS) IMPLANT
NEEDLE HYPO 22GX1.5 SAFETY (NEEDLE) ×6 IMPLANT
NS IRRIG 1000ML POUR BTL (IV SOLUTION) ×12 IMPLANT
PACK GENERAL/GYN (CUSTOM PROCEDURE TRAY) ×3 IMPLANT
RELOAD PROXIMATE 75MM BLUE (ENDOMECHANICALS) IMPLANT
RELOAD PROXIMATE TA60MM BLUE (ENDOMECHANICALS) IMPLANT
RETAINER VISCERA MED (MISCELLANEOUS) ×3 IMPLANT
RETRACTOR WND ALEXIS 25 LRG (MISCELLANEOUS) IMPLANT
RTRCTR WOUND ALEXIS 18CM MED (MISCELLANEOUS)
RTRCTR WOUND ALEXIS 25CM LRG (MISCELLANEOUS)
SEPRAFILM MEMBRANE 5X6 (MISCELLANEOUS) ×3 IMPLANT
SHEET LAVH (DRAPES) ×3 IMPLANT
SPONGE LAP 18X18 X RAY DECT (DISPOSABLE) IMPLANT
STAPLER GUN LINEAR PROX 60 (STAPLE) IMPLANT
STAPLER PROXIMATE 75MM BLUE (STAPLE) IMPLANT
STAPLER VISISTAT 35W (STAPLE) IMPLANT
SUCTION POOLE TIP (SUCTIONS) ×3 IMPLANT
SURGIFLO W/THROMBIN 8M KIT (HEMOSTASIS) IMPLANT
SUT MNCRL AB 4-0 PS2 18 (SUTURE) ×12 IMPLANT
SUT PDS AB 1 TP1 96 (SUTURE) ×6 IMPLANT
SUT PLAIN 2 0 XLH (SUTURE) ×3 IMPLANT
SUT SILK 2 0 (SUTURE) ×1
SUT SILK 2-0 18XBRD TIE 12 (SUTURE) ×2 IMPLANT
SUT SILK 3 0 SH CR/8 (SUTURE) IMPLANT
SUT VIC AB 0 CT1 18XCR BRD 8 (SUTURE) ×2 IMPLANT
SUT VIC AB 0 CT1 27 (SUTURE)
SUT VIC AB 0 CT1 27XBRD ANTBC (SUTURE) IMPLANT
SUT VIC AB 0 CT1 36 (SUTURE) ×12 IMPLANT
SUT VIC AB 0 CT1 8-18 (SUTURE) ×1
SUT VIC AB 2-0 CT1 36 (SUTURE) ×6 IMPLANT
SUT VIC AB 2-0 CT2 27 (SUTURE) ×18 IMPLANT
SUT VIC AB 2-0 SH 27 (SUTURE)
SUT VIC AB 2-0 SH 27X BRD (SUTURE) IMPLANT
SUT VIC AB 3-0 CTX 36 (SUTURE) IMPLANT
SUT VIC AB 3-0 SH 18 (SUTURE) IMPLANT
SUT VIC AB 3-0 SH 27 (SUTURE) ×1
SUT VIC AB 3-0 SH 27X BRD (SUTURE) ×2 IMPLANT
SUT VIC AB 4-0 PS2 27 (SUTURE) ×12 IMPLANT
SUT VICRYL 0 TIES 12 18 (SUTURE) ×3 IMPLANT
SUT VICRYL 4-0 PS2 18IN ABS (SUTURE) ×12 IMPLANT
SYR 30ML LL (SYRINGE) ×6 IMPLANT
TAPE STRIPS DRAPE STRL (GAUZE/BANDAGES/DRESSINGS) ×3 IMPLANT
TOWEL OR 17X26 10 PK STRL BLUE (TOWEL DISPOSABLE) ×6 IMPLANT
TOWEL OR NON WOVEN STRL DISP B (DISPOSABLE) ×3 IMPLANT
TRAP SPECIMEN MUCOUS 40CC (MISCELLANEOUS) ×3 IMPLANT
TRAY FOLEY W/METER SILVER 16FR (SET/KITS/TRAYS/PACK) ×3 IMPLANT
UNDERPAD 30X30 (UNDERPADS AND DIAPERS) ×3 IMPLANT

## 2018-02-10 NOTE — Anesthesia Procedure Notes (Signed)
Procedure Name: Intubation Performed by: Donzel Romack J, CRNA Pre-anesthesia Checklist: Patient identified, Emergency Drugs available, Suction available, Patient being monitored and Timeout performed Patient Re-evaluated:Patient Re-evaluated prior to induction Oxygen Delivery Method: Circle system utilized Preoxygenation: Pre-oxygenation with 100% oxygen Induction Type: IV induction and Rapid sequence Laryngoscope Size: Mac and 4 Grade View: Grade I Tube type: Oral Tube size: 7.0 mm Number of attempts: 1 Airway Equipment and Method: Stylet Placement Confirmation: ETT inserted through vocal cords under direct vision,  positive ETCO2,  CO2 detector and breath sounds checked- equal and bilateral Secured at: 21 cm Tube secured with: Tape Dental Injury: Teeth and Oropharynx as per pre-operative assessment        

## 2018-02-10 NOTE — Op Note (Addendum)
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 (an MD assistant was necessary for tissue manipulation, management of instrumentation, retraction and positioning due to the complexity of the case and hospital policies).   Anesthesia: GETA  Specimens: Bilateral ovaries, bilateral fallopian tubes, pelvic washings  Complications: None  Indication for Procedure: Patient presented with a greater than 20 cm pelvic mass suspected to originate from the adnexa.  This was interfering with her activities of daily living.  Operative Findings: 25 cm left adnexal mass filled with mucinous fluid and some solid portion on the inferior surface.  There was intraoperative leakage of the mucinous fluid.  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 She was placed in a dorsolithotomy position. The patient had sequential compression devices for VTE prophylaxis.  The patient was then prepped in the usual sterile fashion. Time out was performed. A Foley catheter was placed by me.  An incision was made the patient's abdomen started from the symphysis pubis to approximately 3 cm above the umbilicus in order to accommodate the predicted size of the mass.  This incision was carried down to the level of fascia using Bovie cautery.  The fascia was incised in the midline.  There was some mild scarring from a previous vertical incision that she had for an ovarian cystectomy in years past.  The rectus was separated in the midline.  The peritoneum was  entered sharply.  A minimal amount of fluid was noted in the pelvis.  Pelvic washings were obtained.  Exploration revealed some mild adhesions in the lateral surfaces of the mass.  No significant disease on the liver surface although there was an irregular surface to the liver none of these were felt to be related to disease.  The omentum appeared free of nodularity as did the left upper quadrant.  The mass was delivered through the incision was noted to be emanating from the left adnexa.  There was an incidental leakage of the mucinous fluid from a portion of this large cystic mass.  The site of the leakage was identified elevated and a suture placed to allow [place] to continue resection without additional leakage.  The left retroperitoneal space was opened the left ureter was identified.  The left infundibulum pelvic ligament was isolated clamped transected and ligated.  The left broad ligament was skeletonized towards the utero-ovarian ligament which was then clamped transected and ligated.  The specimen was sent for frozen section.  While awaiting the frozen section the right retroperitoneal space was opened the right ureter identified the right infundibulopelvic ligament was isolated clamped transected and ligated and the broad ligament was skeletonized towards the uterus.  Once we had frozen section confirming no evidence of invasive or borderline tumor we then transected the right adnexa at the utero-ovarian ligament clamping transecting and suture ligating.  Obvious irrigation was used hemostasis was noted on all surfaces.  Lap counts were correct x1.  The fascia was closed with 2 #1 looped PDS sutures starting at either end meeting in the midline and tying.  Irrigation was performed of the subcutaneous tissue.  The dermal layer was reapproximated with 4-0 Vicryl  interrupted sutures.  The skin was closed in a subcuticular fashion with 4-0 Monocryl in a running fashion.  Sponge, lap and needle counts  were correct per hospital protocol          Disposition: PACU          Condition: Stable

## 2018-02-10 NOTE — Anesthesia Postprocedure Evaluation (Signed)
Anesthesia Post Note  Patient: Kasarah Larsh  Procedure(s) Performed: EXPLORATORY LAPAROTOMY (N/A ) BILATERAL SALPINGO OOPHORECTOMY; PERITONEAL WASHINGS (Bilateral )     Patient location during evaluation: PACU Anesthesia Type: General Level of consciousness: awake and alert and oriented Pain management: pain level controlled Vital Signs Assessment: post-procedure vital signs reviewed and stable Respiratory status: spontaneous breathing, nonlabored ventilation, respiratory function stable and patient connected to nasal cannula oxygen Cardiovascular status: blood pressure returned to baseline and stable Postop Assessment: no apparent nausea or vomiting Anesthetic complications: no    Last Vitals:  Vitals:   02/10/18 1610 02/10/18 1630  BP: 113/75 129/80  Pulse: 64 75  Resp: 14 16  Temp:  36.4 C  SpO2: 100% 99%    Last Pain:  Vitals:   02/10/18 1630  TempSrc:   PainSc: 2                  Evoleht Hovatter A.

## 2018-02-10 NOTE — Anesthesia Preprocedure Evaluation (Signed)
Anesthesia Evaluation  Patient identified by MRN, date of birth, ID band Patient awake    Reviewed: Allergy & Precautions, NPO status , Patient's Chart, lab work & pertinent test results  History of Anesthesia Complications (+) PONV and history of anesthetic complications  Airway Mallampati: II  TM Distance: >3 FB Neck ROM: Full    Dental  (+) Caps   Pulmonary neg pulmonary ROS,    Pulmonary exam normal breath sounds clear to auscultation       Cardiovascular hypertension, Pt. on medications Normal cardiovascular exam Rhythm:Regular Rate:Normal     Neuro/Psych PSYCHIATRIC DISORDERS Depression negative neurological ROS     GI/Hepatic negative GI ROS, Neg liver ROS,   Endo/Other  negative endocrine ROS  Renal/GU negative Renal ROS     Musculoskeletal negative musculoskeletal ROS (+)   Abdominal (+) + obese,   Peds  Hematology negative hematology ROS (+)   Anesthesia Other Findings   Reproductive/Obstetrics Pelvic mass                             Anesthesia Physical Anesthesia Plan  ASA: II  Anesthesia Plan: General   Post-op Pain Management:    Induction: Intravenous and Cricoid pressure planned  PONV Risk Score and Plan: 4 or greater and Midazolam, Scopolamine patch - Pre-op, Dexamethasone, Ondansetron and Treatment may vary due to age or medical condition  Airway Management Planned: Oral ETT  Additional Equipment:   Intra-op Plan:   Post-operative Plan: Extubation in OR  Informed Consent: I have reviewed the patients History and Physical, chart, labs and discussed the procedure including the risks, benefits and alternatives for the proposed anesthesia with the patient or authorized representative who has indicated his/her understanding and acceptance.   Dental advisory given  Plan Discussed with: Anesthesiologist, CRNA and Surgeon  Anesthesia Plan Comments:          Anesthesia Quick Evaluation

## 2018-02-10 NOTE — Transfer of Care (Signed)
Immediate Anesthesia Transfer of Care Note  Patient: Kendra [name]  Procedure(s) Performed: EXPLORATORY LAPAROTOMY (N/A ) BILATERAL SALPINGO OOPHORECTOMY; PERITONEAL WASHINGS (Bilateral )  Patient Location: PACU  Anesthesia Type:General  Level of Consciousness: awake, alert  and oriented  Airway & Oxygen Therapy: Patient Spontanous Breathing and Patient connected to face mask oxygen  Post-op Assessment: Report given to RN and Post -op Vital signs reviewed and stable  Post vital signs: Reviewed and stable  Last Vitals:  Vitals Value Taken Time  BP 118/75 02/10/2018  3:22 PM  Temp    Pulse 74 02/10/2018  3:24 PM  Resp 13 02/10/2018  3:24 PM  SpO2 99 % 02/10/2018  3:24 PM  Vitals shown include unvalidated device data.  Last Pain:  Vitals:   02/10/18 1044  TempSrc: Oral      Patients Stated Pain Goal: 4 (28/36/62 9476)  Complications: No apparent anesthesia complications

## 2018-02-11 ENCOUNTER — Encounter (HOSPITAL_COMMUNITY): Payer: Self-pay | Admitting: Obstetrics

## 2018-02-11 LAB — CBC
HCT: 36.7 % (ref 36.0–46.0)
Hemoglobin: 12 g/dL (ref 12.0–15.0)
MCH: 28.3 pg (ref 26.0–34.0)
MCHC: 32.7 g/dL (ref 30.0–36.0)
MCV: 86.6 fL (ref 78.0–100.0)
Platelets: 318 10*3/uL (ref 150–400)
RBC: 4.24 MIL/uL (ref 3.87–5.11)
RDW: 13.3 % (ref 11.5–15.5)
WBC: 10.5 10*3/uL (ref 4.0–10.5)

## 2018-02-11 LAB — BASIC METABOLIC PANEL
Anion gap: 8 (ref 5–15)
BUN: 8 mg/dL (ref 6–20)
CO2: 26 mmol/L (ref 22–32)
CREATININE: 0.8 mg/dL (ref 0.44–1.00)
Calcium: 8.4 mg/dL — ABNORMAL LOW (ref 8.9–10.3)
Chloride: 98 mmol/L — ABNORMAL LOW (ref 101–111)
GFR calc non Af Amer: >60 mL/min (ref >60)
Glucose, Bld: 136 mg/dL — ABNORMAL HIGH (ref 65–99)
POTASSIUM: 4 mmol/L (ref 3.5–5.1)
SODIUM: 132 mmol/L — AB (ref 135–145)

## 2018-02-11 MED ORDER — PANTOPRAZOLE SODIUM 40 MG IV SOLR: 40.0000 mg | INTRAVENOUS | Status: DC

## 2018-02-11 MED ORDER — SODIUM CHLORIDE 0.9 % IV SOLN
INTRAVENOUS | Status: DC
  Administered 2018-02-11: 11:00:00 via INTRAVENOUS

## 2018-02-11 MED ORDER — PANTOPRAZOLE SODIUM 40 MG PO TBEC
40.0000 mg | DELAYED_RELEASE_TABLET | Freq: Every day | ORAL | Status: DC
  Administered 2018-02-11 – 2018-02-14 (×4): 40 mg via ORAL
  Filled 2018-02-11 (×4): qty 1

## 2018-02-11 MED ORDER — IBUPROFEN 200 MG PO TABS: 600.0000 mg | ORAL_TABLET | Freq: Four times a day (QID) | ORAL | Status: DC | PRN

## 2018-02-11 NOTE — Progress Notes (Signed)
1 Day Post-Op Procedure(s) (LRB): EXPLORATORY LAPAROTOMY (N/A) BILATERAL SALPINGO OOPHORECTOMY; PERITONEAL WASHINGS (Bilateral)  Subjective: Patient reports mild pain, controlled with meds.  Has been out of bed and in the hall x1.  Using incentive spirometer.  A entire tray for breakfast.  Foley was removed just prior to my entering the room.  Objective: Vital signs in last 24 hours: Temp:  [96.2 F (35.7 C)-99.1 F (37.3 C)] 99 F (37.2 C) (04/12 0557) Pulse Rate:  [59-89] 87 (04/12 0557) Resp:  [11-18] 15 (04/12 0557) BP: (77-154)/(54-82) 121/62 (04/12 0557) SpO2:  [95 %-100 %] 95 % (04/12 0557) Weight:  [191 lb (86.6 kg)] 191 lb (86.6 kg) (04/11 1055) Last BM Date: 02/10/18  Intake/Output from previous day: 04/11 0701 - 04/12 0700 In: 2810 [P.O.:60; I.V.:2500; IV Piggyback:250] Out: 955 [Urine:905; Blood:50]  Physical Examination: General: no distress Resp: diminished breath sounds base - biLateral Cardio: regular rate and rhythm GI: soft, non-tender; bowel sounds normal; no masses,  no organomegaly Extremities: extremities normal, atraumatic, no cyanosis or edema  Labs: WBC/Hgb/Hct/Plts:  10.5/12.0/36.7/318 (04/12 0455) BUN/Cr/glu/ALT/AST/amyl/lip:  8/0.80/--/--/--/--/-- (04/12 0455)   Assessment:  72 y.o. s/p Procedure(s): EXPLORATORY LAPAROTOMY BILATERAL SALPINGO OOPHORECTOMY; PERITONEAL WASHINGS: stable Pain:  Pain is controlled on IV/ oral medications.  Heme: No issues  ID: no issues   CV: History of hypertension with no issues currently  GI:  Tolerating po: Yes    FEN: Mild hyponatremia switched IV fluids to normal saline   Prophylaxis: pharmacologic prophylaxis (with any of the following: Lovenox).  Plan: Encourage ambulation Dispo:  Discharge plan to include :consults: @CM @, Social Work The patient is to be discharged to home.   LOS: 1 day    Isabel Caprice 02/11/2018, 9:04 AM

## 2018-02-11 NOTE — Progress Notes (Signed)
PHARMACY BRIEF NOTE:  PROTONIX IV TO PO CONVERSION  This patient is ordered IV Protonix, which is in critically short supply. Based on P&T recommendations, this patient meets criteria for automatic switch to PO Protonix; the medication profile has been updated accordingly.  If there are questions regarding this change, please contact Pharmacy at [phone]  Thank you,  Minda Ditto PharmD Pager [phone] 02/11/2018, 9:33 AM

## 2018-02-12 LAB — BASIC METABOLIC PANEL
Anion gap: 10 (ref 5–15)
BUN: 7 mg/dL (ref 6–20)
CALCIUM: 8.7 mg/dL — AB (ref 8.9–10.3)
CHLORIDE: 100 mmol/L — AB (ref 101–111)
CO2: 25 mmol/L (ref 22–32)
CREATININE: 0.66 mg/dL (ref 0.44–1.00)
GFR calc non Af Amer: >60 mL/min (ref >60)
Glucose, Bld: 117 mg/dL — ABNORMAL HIGH (ref 65–99)
Potassium: 3.7 mmol/L (ref 3.5–5.1)
SODIUM: 135 mmol/L (ref 135–145)

## 2018-02-12 MED ORDER — IBUPROFEN 200 MG PO TABS
600.0000 mg | ORAL_TABLET | Freq: Four times a day (QID) | ORAL | Status: DC | PRN
  Administered 2018-02-12 – 2018-02-13 (×3): 600 mg via ORAL
  Filled 2018-02-12 (×3): qty 3

## 2018-02-12 MED ORDER — TRAMADOL HCL 50 MG PO TABS: 100.0000 mg | ORAL_TABLET | Freq: Two times a day (BID) | ORAL | Status: DC | PRN

## 2018-02-12 NOTE — Progress Notes (Signed)
2 Days Post-Op Procedure(s) (LRB): EXPLORATORY LAPAROTOMY (N/A) BILATERAL SALPINGO OOPHORECTOMY; PERITONEAL WASHINGS (Bilateral)  Subjective: Patient reports mild abdominal pain, some controlled with meds.  Has been out of bed and in the hall several times.  Using incentive spirometer.  Voiding without difficulty.  Objective: Vital signs in last 24 hours: Temp:  [98.5 F (36.9 C)-99.2 F (37.3 C)] 98.9 F (37.2 C) (04/13 0536) Pulse Rate:  [85-91] 85 (04/13 0536) Resp:  [16-18] 16 (04/13 0536) BP: (124-143)/(59-72) 143/72 (04/13 0536) SpO2:  [94 %-98 %] 94 % (04/13 0536) Last BM Date: 02/10/18  Intake/Output from previous day: 04/12 0701 - 04/13 0700 In: 1923.8 [P.O.:720; I.V.:1203.8] Out: 1600 [Urine:1600]  Physical Exam  HENT:  Head: Normocephalic.  Eyes: EOM are normal.  Cardiovascular: Normal rate and regular rhythm.  Respiratory: Breath sounds normal.  GI: Soft. She exhibits distension. There is no tenderness. There is no rebound and no guarding.  Musculoskeletal: She exhibits no edema or tenderness.   Mild distension. Decreased BS.  Labs:       Assessment:/  72 y.o. s/p Procedure(s): EXPLORATORY LAPAROTOMY BILATERAL SALPINGO OOPHORECTOMY; PERITONEAL WASHINGS: stable Pain:  Pain is controlled on IV/ oral medications.  Heme: No issues  ID: no issues   CV: History of hypertension on BP meds. Reasonable pressures.  GI:  Tolerating po: Yes.   FEN: Mild hyponatremia switched IV fluids to normal saline.    Prophylaxis: pharmacologic prophylaxis (with any of the following: Lovenox).  Plan: Encourage ambulation  Repeat BMP today. If NA normalized will dc IVF.Decrease rate today. Dispo:  Discharge plan to include The patient is to be discharged to home.   LOS: 2 days    Isabel Caprice 02/12/2018, 9:08 AM

## 2018-02-13 MED ORDER — HYDROMORPHONE HCL 1 MG/ML IJ SOLN: 0.5000 mg | INTRAMUSCULAR | Status: AC | PRN

## 2018-02-13 MED ORDER — HYDROCODONE-ACETAMINOPHEN 5-325 MG PO TABS
1.0000 | ORAL_TABLET | ORAL | Status: DC | PRN
  Administered 2018-02-13 – 2018-02-14 (×4): 1 via ORAL
  Filled 2018-02-13 (×4): qty 1

## 2018-02-13 MED ORDER — ACETAMINOPHEN 500 MG PO TABS: 500.0000 mg | ORAL_TABLET | Freq: Four times a day (QID) | ORAL | Status: DC | PRN

## 2018-02-13 NOTE — Progress Notes (Signed)
3 Days Post-Op Procedure(s) (LRB): EXPLORATORY LAPAROTOMY (N/A) BILATERAL SALPINGO OOPHORECTOMY; PERITONEAL WASHINGS (Bilateral)  Subjective: Patient reports passing flatus and small BM. No nausea.  Objective: Vital signs in last 24 hours: Temp:  [98 F (36.7 C)-98.4 F (36.9 C)] 98 F (36.7 C) (04/14 0649) Pulse Rate:  [71-89] 79 (04/14 0649) Resp:  [15-17] 17 (04/14 0649) BP: (113-131)/(65-78) 131/78 (04/14 0649) SpO2:  [97 %-100 %] 97 % (04/14 0649) Last BM Date: 02/12/18(very small)  Intake/Output from previous day: 04/13 0701 - 04/14 0700 In: 2419.3 [P.O.:1340; I.V.:1079.3] Out: 2150 [Urine:2150]   Labs:   BUN/Cr/glu/ALT/AST/amyl/lip:  7/0.66/--/--/--/--/-- (04/13 6256)   Physical Exam  Constitutional: She is well-developed, well-nourished, and in no distress.  Cardiovascular: Normal rate and regular rhythm.  Pulmonary/Chest: Breath sounds normal.  Abdominal: Soft. She exhibits no distension. There is no tenderness.  Musculoskeletal: She exhibits no edema or tenderness.  Bowel sounds are high pitched in some areas. Dressing removed and incision CDI with some staining of steristrips  Assessment:/  72 y.o. s/p Procedure(s): EXPLORATORY LAPAROTOMY BILATERAL SALPINGO OOPHORECTOMY; PERITONEAL WASHINGS: stable Pain:  Pain is controlled on IV/ oral medications.  Heme: No issues  ID: no issues   CV: History of hypertension on BP meds. Reasonable pressures.  GI:  Tolerating po: Yes.   FEN: Mild hyponatremia resolved.    Prophylaxis: pharmacologic prophylaxis (with any of the following: Lovenox).  Plan: Encourage ambulation  HypoNa resolved. DC IVF Despite flatus bowel sounds are mildly concerning. Will monitor today. Patient may shower.  Dispo:  Discharge plan to include The patient is to be discharged to home.   LOS: 3 days    Kendra [name] 02/13/2018, 9:25 AM

## 2018-02-14 MED ORDER — HYDROCODONE-ACETAMINOPHEN 5-325 MG PO TABS: 1.0000 | ORAL_TABLET | ORAL | 0 refills | Status: DC | PRN

## 2018-02-14 NOTE — Discharge Instructions (Signed)
02/14/2018  Return to work: 4-6 weeks if applicable  Activity: 1. Be up and out of the bed during the day.  Take a nap if needed.  You may walk up steps but be careful and use the hand rail.  Stair climbing will tire you more than you think, you may need to stop part way and rest.   2. No lifting or straining for 6 weeks.  3. No driving for 1 week(s).  Do not drive if you are taking narcotic pain medicine.  4. Shower daily.  Use soap and water on your incision and pat dry; don't rub.  No tub baths until cleared by your surgeon.   5. No sexual activity and nothing in the vagina for 6 weeks.  6. You may experience a small amount of clear drainage from your incisions, which is normal.  If the drainage persists or increases, please call the office.  7. You may experience vaginal spotting after surgery or around the 6-8 week mark from surgery when the stitches at the top of the vagina begin to dissolve.  The spotting is normal but if you experience heavy bleeding, call our office.  8. Take Tylenol or ibuprofen first for pain and only use Percocet for severe pain not relieved by the Tylenol or Ibuprofen.  Monitor your Tylenol intake to a max of 4,000 mg a day since Percocet has Tylenol in it as well.  Diet: 1. Low sodium Heart Healthy Diet is recommended.  2. It is safe to use a laxative, such as Miralax or Colace, if you have difficulty moving your bowels. You can take Sennakot at bedtime every evening to keep bowel movements regular and to prevent constipation.    Wound Care: 1. Keep clean and dry.  Shower daily.  Reasons to call the Doctor:  Fever - Oral temperature greater than 100.4 degrees Fahrenheit  Foul-smelling vaginal discharge  Difficulty urinating  Nausea and vomiting  Increased pain at the site of the incision that is unrelieved with pain medicine.  Difficulty breathing with or without chest pain  New calf pain especially if only on one side  Sudden, continuing  increased vaginal bleeding with or without clots.   Contacts: For questions or concerns you should contact:  Dr. Everitt Amber at [phone]  Joylene John, NP at [phone]  After Hours: call [phone] and have the GYN Oncologist paged/contacted  Acetaminophen; Hydrocodone tablets or capsules What is this medicine? ACETAMINOPHEN; HYDROCODONE (a set a MEE noe fen; hye droe KOE done) is a pain reliever. It is used to treat moderate to severe pain. This medicine may be used for other purposes; ask your health care provider or pharmacist if you have questions. COMMON BRAND NAME(S): Anexsia, Bancap HC, Ceta-Plus, Co-Gesic, Comfortpak, Dolagesic, [employer], DuoCet, Hydrocet, Hydrogesic, [place], Lorcet HD, Lorcet Plus, Lortab, Margesic H, Maxidone, [place], Polygesic, [place], [place], [place], Vicodin, Vicodin ES, Vicodin HP, Charlane Ferretti What should I tell my health care provider before I take this medicine? They need to know if you have any of these conditions: -brain tumor -Crohn's disease, inflammatory bowel disease, or ulcerative colitis -drug abuse or addiction -head injury -heart or circulation problems -if you often drink alcohol -kidney disease or problems going to the bathroom -liver disease -lung disease, asthma, or breathing problems -an unusual or allergic reaction to acetaminophen, hydrocodone, other opioid analgesics, other medicines, foods, dyes, or preservatives -pregnant or trying to get pregnant -breast-feeding How should I use this medicine? Take this medicine by mouth with  a glass of water. Follow the directions on the prescription label. You can take it with or without food. If it upsets your stomach, take it with food. Do not take your medicine more often than directed. A special MedGuide will be given to you by the pharmacist with each prescription and refill. Be sure to read this information carefully each time. Talk to your pediatrician regarding the  use of this medicine in children. Special care may be needed. Overdosage: If you think you have taken too much of this medicine contact a poison control center or emergency room at once. NOTE: This medicine is only for you. Do not share this medicine with others. What if I miss a dose? If you miss a dose, take it as soon as you can. If it is almost time for your next dose, take only that dose. Do not take double or extra doses. What may interact with this medicine? This medicine may interact with the following medications: -alcohol -antiviral medicines for HIV or AIDS -atropine -antihistamines for allergy, cough and cold -certain antibiotics like erythromycin, clarithromycin -certain medicines for anxiety or sleep -certain medicines for bladder problems like oxybutynin, tolterodine -certain medicines for depression like amitriptyline, fluoxetine, sertraline -certain medicines for fungal infections like ketoconazole and itraconazole -certain medicines for Parkinson's disease like benztropine, trihexyphenidyl -certain medicines for seizures like carbamazepine, phenobarbital, phenytoin, primidone -certain medicines for stomach problems like dicyclomine, hyoscyamine -certain medicines for travel sickness like scopolamine -general anesthetics like halothane, isoflurane, methoxyflurane, propofol -ipratropium -local anesthetics like lidocaine, pramoxine, tetracaine -MAOIs like Carbex, Eldepryl, Marplan, Nardil, and Parnate -medicines that relax muscles for surgery -other medicines with acetaminophen -other narcotic medicines for pain or cough -phenothiazines like chlorpromazine, mesoridazine, prochlorperazine, thioridazine -rifampin This list may not describe all possible interactions. Give your health care provider a list of all the medicines, herbs, non-prescription drugs, or dietary supplements you use. Also tell them if you smoke, drink alcohol, or use illegal drugs. Some items may interact  with your medicine. What should I watch for while using this medicine? Tell your doctor or health care professional if your pain does not go away, if it gets worse, or if you have new or a different type of pain. You may develop tolerance to the medicine. Tolerance means that you will need a higher dose of the medicine for pain relief. Tolerance is normal and is expected if you take the medicine for a long time. Do not suddenly stop taking your medicine because you may develop a severe reaction. Your body becomes used to the medicine. This does NOT mean you are addicted. Addiction is a behavior related to getting and using a drug for a non-medical reason. If you have pain, you have a medical reason to take pain medicine. Your doctor will tell you how much medicine to take. If your doctor wants you to stop the medicine, the dose will be slowly lowered over time to avoid any side effects. There are different types of narcotic medicines (opiates). If you take more than one type at the same time or if you are taking another medicine that also causes drowsiness, you may have more side effects. Give your health care provider a list of all medicines you use. Your doctor will tell you how much medicine to take. Do not take more medicine than directed. Call emergency for help if you have problems breathing or unusual sleepiness. Do not take other medicines that contain acetaminophen with this medicine. Always read labels carefully. If you  have questions, ask your doctor or pharmacist. If you take too much acetaminophen get medical help right away. Too much acetaminophen can be very dangerous and cause liver damage. Even if you do not have symptoms, it is important to get help right away. You may get drowsy or dizzy. Do not drive, use machinery, or do anything that needs mental alertness until you know how this medicine affects you. Do not stand or sit up quickly, especially if you are an older patient. This reduces  the risk of dizzy or fainting spells. Alcohol may interfere with the effect of this medicine. Avoid alcoholic drinks. The medicine will cause constipation. Try to have a bowel movement at least every 2 to 3 days. If you do not have a bowel movement for 3 days, call your doctor or health care professional. Your mouth may get dry. Chewing sugarless gum or sucking hard candy, and drinking plenty of water may help. Contact your doctor if the problem does not go away or is severe. What side effects may I notice from receiving this medicine? Side effects that you should report to your doctor or health care professional as soon as possible: -allergic reactions like skin rash, itching or hives, swelling of the face, lips, or tongue -breathing problems -confusion -redness, blistering, peeling or loosening of the skin, including inside the mouth -signs and symptoms of low blood pressure like dizziness; feeling faint or lightheaded, falls; unusually weak or tired -trouble passing urine or change in the amount of urine -yellowing of the eyes or skin Side effects that usually do not require medical attention (report to your doctor or health care professional if they continue or are bothersome): -constipation -dry mouth -nausea, vomiting -tiredness This list may not describe all possible side effects. Call your doctor for medical advice about side effects. You may report side effects to FDA at 1-800-FDA-1088. Where should I keep my medicine? Keep out of the reach of children. This medicine can be abused. Keep your medicine in a safe place to protect it from theft. Do not share this medicine with anyone. Selling or giving away this medicine is dangerous and against the law. This medicine may cause accidental overdose and death if it taken by other adults, children, or pets. Mix any unused medicine with a substance like cat litter or coffee grounds. Then throw the medicine away in a sealed container like a  sealed bag or a coffee can with a lid. Do not use the medicine after the expiration date. Store at room temperature between 15 and 30 degrees C (59 and 86 degrees F). NOTE: This sheet is a summary. It may not cover all possible information. If you have questions about this medicine, talk to your doctor, pharmacist, or health care provider.  2018 Elsevier/Gold Standard (2015-07-12 10:02:16)

## 2018-02-14 NOTE — Discharge Summary (Addendum)
Physician Discharge Summary  Patient ID: Kendra [name] MRN: [id number] DOB/AGE: 12/29/1945 72 y.o.  Admit date: 02/10/2018 Discharge date: 02/14/2018  Admission Diagnoses: Pelvic mass in female  Discharge Diagnoses:  Principal Problem:   Pelvic mass in female   Discharged Condition:  The patient is in good condition and stable for discharge.   Hospital Course: On 02/10/2018, the patient underwent the following: Procedure(s): [place]; PERITONEAL WASHINGS.  The postoperative course was uneventful.  She was discharged to home on postoperative day 4  tolerating a regular diet, passing flatus, having bowel movements, pain controlled, voiding.  Consults: None  Significant Diagnostic Studies: see above  Treatments: surgery: see above  Discharge Exam (Pt seen and examined by Dr. Gerarda Fraction): Blood pressure 121/78, pulse 83, temperature 98.8 F (37.1 C), temperature source Oral, resp. rate 18, height 5\' 6"  (1.676 m), weight 191 lb (86.6 kg), SpO2 97 %. General appearance: alert, cooperative and no distress Resp: clear to auscultation bilaterally Cardio: regular rate and rhythm, S1, S2 normal, no murmur, click, rub or gallop GI: soft, non-tender; bowel sounds normal; no masses,  no organomegaly Extremities: extremities normal, atraumatic, no cyanosis or edema Incision/Wound: Midline incision intact with no drainage   Disposition: Discharge disposition: 01-Home or Self Care       Discharge Instructions    Call MD for:  difficulty breathing, headache or visual disturbances   Complete by:  As directed    Call MD for:  extreme fatigue   Complete by:  As directed    Call MD for:  hives   Complete by:  As directed    Call MD for:  persistant dizziness or light-headedness   Complete by:  As directed    Call MD for:  persistant nausea and vomiting   Complete by:  As directed    Call MD for:  redness, tenderness, or signs of infection  (pain, swelling, redness, odor or green/yellow discharge around incision site)   Complete by:  As directed    Call MD for:  severe uncontrolled pain   Complete by:  As directed    Call MD for:  temperature >100.4   Complete by:  As directed    Diet - low sodium heart healthy   Complete by:  As directed    Driving Restrictions   Complete by:  As directed    No driving for 1 week.  Do not take narcotics and drive.   Increase activity slowly   Complete by:  As directed    Lifting restrictions   Complete by:  As directed    No lifting greater than 10 lbs.   Sexual Activity Restrictions   Complete by:  As directed    No sexual activity, nothing in the vagina, for 6 weeks.     Allergies as of 02/14/2018      Reactions   Hydroxyzine Anaphylaxis   Tongue swollen   Shrimp [shellfish Allergy] Other (See Comments)   Food poisoning       Medication List    TAKE these medications   buPROPion 300 MG 24 hr tablet Commonly known as:  WELLBUTRIN XL Take 300 mg by mouth daily.   FLUoxetine 20 MG capsule Commonly known as:  PROZAC Take 20 mg by mouth daily.   HYDROcodone-acetaminophen 5-325 MG tablet Commonly known as:  NORCO/VICODIN Take 1 tablet by mouth every 4 (four) hours as needed for moderate pain.   lisinopril-hydrochlorothiazide 20-12.5 MG tablet Commonly known as:  PRINZIDE,ZESTORETIC Take 1  tablet by mouth daily.   LORazepam 0.5 MG tablet Commonly known as:  ATIVAN Take 0.5 mg by mouth daily as needed for anxiety.   OVER THE COUNTER MEDICATION Take 2 each by mouth at bedtime. OTC Pharma GABA sleep med at bedtime for sleep per pt.      Follow-up Information    Isabel Caprice, MD Follow up.   Specialty:  Gynecologic Oncology Why:  as scheduled Contact information: [place] [address] [phone]           Greater than thirty minutes were spend for face to face discharge instructions and discharge orders/summary in EPIC.    Signed: Dorothyann Gibbs 02/14/2018, 11:38 AM  Patient seen and examined by me. Agree with above. Plan discharge home. Precious Haws, MD

## 2018-02-14 NOTE — Progress Notes (Signed)
Pt alert and oriented, tolerating diet and ambulating.  D/C instructions were given and all questions answered.  Pt was d/cd home with spouse.

## 2018-02-14 NOTE — Care Management Important Message (Signed)
Important Message  Patient Details  Name: Kendra [name] MRN: [id number] Date of Birth: 11-03-1945   Medicare Important Message Given:  Yes    [name] 02/14/2018, 11:39 AMImportant Message  Patient Details  Name: Kendra [name] MRN: [id number] Date of Birth: May 30, 1946   Medicare Important Message Given:  Yes    [name] 02/14/2018, 11:38 AM

## 2018-02-15 ENCOUNTER — Telehealth: Payer: Self-pay | Admitting: *Deleted

## 2018-02-15 NOTE — Telephone Encounter (Signed)
Called patient per Joylene John, NP to see how she was doing after surgery. Patient states " I am doing fairly well", my pain is about a 4 at this time and the pain medication is helping".  Patient states that the left side of her abdomen feels a little firmer that the right side".  Advised to use a heating pad or ice per Joylene John, NP.  Patient states " I am able to make it up steps, and get in and out of the bed.  I reminded patient of her post-op appointment on April 24th at 3:15.  Advised patient to give our office a call if she has any questions or concerns before that time.

## 2018-02-22 ENCOUNTER — Telehealth: Payer: Self-pay

## 2018-02-22 NOTE — Telephone Encounter (Signed)
Told Ms Valentine that the pathology showed no cancer per Dr Gerarda Fraction.

## 2018-02-23 ENCOUNTER — Encounter: Payer: Self-pay | Admitting: Obstetrics

## 2018-02-23 ENCOUNTER — Inpatient Hospital Stay (HOSPITAL_BASED_OUTPATIENT_CLINIC_OR_DEPARTMENT_OTHER): Payer: Medicare Other | Admitting: Obstetrics

## 2018-02-23 VITALS — BP 130/84 | HR 74 | Temp 98.5°F | Resp 20 | Ht 66.0 in | Wt 167.9 lb

## 2018-02-23 DIAGNOSIS — N83201 Unspecified ovarian cyst, right side: Secondary | ICD-10-CM

## 2018-02-23 DIAGNOSIS — R1900 Intra-abdominal and pelvic swelling, mass and lump, unspecified site: Secondary | ICD-10-CM

## 2018-02-23 NOTE — Patient Instructions (Signed)
You can call patient accounting at [phone] to discuss your stay in the hospital.  We will also reach out to our prior authorization department for surgeries to follow up.    We will follow up on getting you in for evaluation of the breast mass.  Follow up in one month or sooner if needed.

## 2018-02-24 ENCOUNTER — Telehealth: Payer: Self-pay | Admitting: Oncology

## 2018-02-24 NOTE — Telephone Encounter (Signed)
Spoke to [place] at Dr. Gardiner Coins office.  She will call patient and set up the referral to the [facility] for ultrasound and diagnostic mammogram of the right breast.

## 2018-02-24 NOTE — Telephone Encounter (Signed)
Left a message for Seth Bake at Dr. Gardiner Coins office regarding referral for patient to the [facility].  Requested a return call.

## 2018-02-28 ENCOUNTER — Telehealth: Payer: Self-pay | Admitting: Oncology

## 2018-02-28 NOTE — Telephone Encounter (Signed)
Received a call from Seth Bake with [facility].  She said patient has been contacted by the [facility] about where and when her past mammogram was done.  Patient can't remember and was told they would have to start from scratch.  Thompson Caul that the mass was seen on a CT scan but that she needs to be scheduled even if they need to start from scratch.  Seth Bake said she would call the [facility] to set it up.

## 2018-03-01 ENCOUNTER — Telehealth: Payer: Self-pay | Admitting: Oncology

## 2018-03-01 NOTE — Telephone Encounter (Signed)
Called patient and asked if she has been called by the [facility] about scheduling a diagnostic mammogram.   She said she talked to [place] from [facility] yesterday and they are trying to schedule a diagnostic mammogram of both breasts and an ultrasound.  Kenney Houseman said she was not sure if they would be able to do the mammogram due to patient's surgical incision but was going to check and call back today or tomorrow.  Advised patient that I will call back tomorrow morning to see if it was scheduled.

## 2018-03-02 DIAGNOSIS — C50919 Malignant neoplasm of unspecified site of unspecified female breast: Secondary | ICD-10-CM

## 2018-03-02 HISTORY — DX: Malignant neoplasm of unspecified site of unspecified female breast: C50.919

## 2018-03-14 ENCOUNTER — Ambulatory Visit
Admission: RE | Admit: 2018-03-14 | Discharge: 2018-03-14 | Disposition: A | Discharge status: Home / Self Care | Payer: Medicare Other | Source: Ambulatory Visit | Attending: Obstetrics & Gynecology | Admitting: Obstetrics & Gynecology

## 2018-03-14 ENCOUNTER — Telehealth: Payer: Self-pay

## 2018-03-14 ENCOUNTER — Other Ambulatory Visit: Payer: Self-pay | Admitting: Obstetrics & Gynecology

## 2018-03-14 DIAGNOSIS — N6310 Unspecified lump in the right breast, unspecified quadrant: Secondary | ICD-10-CM

## 2018-03-14 DIAGNOSIS — R928 Other abnormal and inconclusive findings on diagnostic imaging of breast: Secondary | ICD-10-CM

## 2018-03-14 HISTORY — PX: BREAST BIOPSY: SHX20

## 2018-03-14 NOTE — Telephone Encounter (Signed)
Told Kendra [name] that the breast center will do the mammogram with out  her previous mammogram reports.  Confirmed this this am with [facility].  Pt not able to locate films from [facility].

## 2018-03-16 ENCOUNTER — Encounter: Payer: Self-pay | Admitting: *Deleted

## 2018-03-16 ENCOUNTER — Telehealth: Payer: Self-pay | Admitting: Hematology and Oncology

## 2018-03-16 NOTE — Telephone Encounter (Signed)
Spoke with patient to confirm afternoon [facility] appointment for 5/22, packet mailed and emailed to patient

## 2018-03-16 NOTE — Progress Notes (Signed)
Progress Note: Gyn-Onc  Consult was originally requested by Dr. Benjie Karvonen  CC:  Chief Complaint  Patient presents with  . Pelvic mass in female    HPI: Kendra [name]  is a very nice 72 y.o.  P0  She was in her usual state of health until about 3 weeks prior to presentation.  She was previously very active involving herself in yoga and walking but over 3 weeks noticed that this had become a little harder. She has been tiring easier and becoming relatively short of breath with activity.  She also notes that her abdomen was "swelling" and she felt like maybe she had ovarian cyst like she had had in the past.  There is for she followed up with her gynecologist Dr. Lisbeth Renshaw.  Workup by Dr. Lisbeth Renshaw including the exam and imaging along with tumor markers.  She presents with a CT scan and tumor marker results as follows:  CT abdomen pelvis with contrast 02/03/2018 at [facility] imaging revealed a right breast mass 2.6 cm and then an abdominal pelvic mass measuring 28.5 x 17 x 25 cm with enhancing septations and solid nodularity originating within the pelvis and extending into the upper abdomen favored to represent an ovarian mass.  01/26/2018 Ca1 25 equals 48.2 with upper limit normal being 38, [name] 4.7 with normal being up to 4.7 but for non-smoker being less than 3.9.  Also CA 19-9 which was markedly elevated at 322 with upper normal being 35.  She has noted some abdominal pain accompanying her diagnosis noting that this moves mostly on the right side she has occasional right lower quadrant mid right side and right upper quadrant.  She occasionally feels discomfort mid epigastric mostly after she eats.  She notices that the pain is worsened by position.  She has had some constipation and early satiety along with some reflux.  She claims to have a good appetite but is not eating as much due to the discomfort from the large mass.  She denies nausea and vomiting.  On her paperwork she wrote chest pain but when I  asked her where that is physically she pointed to her mid upper abdominal region.  I did take her to the operating room 02/10/2018 at which time exploration laparotomy and bilateral salpingo-oophorectomy was performed in addition to pelvic washings.  Frozen section revealed a benign mucinous cystadenofibroma.  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  Felt to be benign.  She had a right tube and ovary removed.  Both the left fallopian tube and right fallopian tube were benign and the right ovary was benign.  She returns today for an early postoperative check and review of the pathology report.  She is overall doing well some constipation.  She is however moving her bowels once every other day.  She denies fever denies nausea denies vomiting.  She has yet to have further work-up of her breast mass.   Note that she has a history of a psychotic breakdown when she had some issues with her sleep and she has a previous history of alcoholism.  It is been greater than 12 years since she drank alcohol.  She was admitted for a psychotic breakdown 5 years ago and has not had no issues since she has been weaning off of medications to help with this and requires her "pharma GABA" to help her sleep at night currently.  Measurement of disease:  No results for input(s): CA125, KGY185, [name],  CA199, ESTRADIOL, INHBB in the last 8760 hours.  Invalid input(s): INHIBINA  . Pending surgical findings   Radiology: . CT abdomen and pelvis as noted above 02/03/2018    Oncologic History: Pending surgical findings    No history exists.    Current Meds:  Outpatient Encounter Medications as of 02/23/2018  Medication Sig  . buPROPion (WELLBUTRIN XL) 300 MG 24 hr tablet Take 300 mg by mouth daily.  Marland Kitchen FLUoxetine (PROZAC) 20 MG capsule Take 20 mg by mouth daily.  Marland Kitchen HYDROcodone-acetaminophen (NORCO/VICODIN) 5-325 MG tablet Take 1 tablet by mouth every 4  (four) hours as needed for moderate pain.  Marland Kitchen lisinopril-hydrochlorothiazide (PRINZIDE,ZESTORETIC) 20-12.5 MG tablet Take 1 tablet by mouth daily.  Marland Kitchen LORazepam (ATIVAN) 0.5 MG tablet Take 0.5 mg by mouth daily as needed for anxiety.  Marland Kitchen OVER THE COUNTER MEDICATION Take 2 each by mouth at bedtime. OTC Pharma GABA sleep med at bedtime for sleep per pt.    No facility-administered encounter medications on file as of 02/23/2018.     Allergy:  Allergies  Allergen Reactions  . Hydroxyzine Anaphylaxis    Tongue swollen  . Shrimp [Shellfish Allergy] Other (See Comments)    Food poisoning     Social Hx:   Social History   Socioeconomic History  . Marital status: Married    Spouse name: Not on file  . Number of children: Not on file  . Years of education: Not on file  . Highest education level: Not on file  Occupational History  . Not on file  Social Needs  . Financial resource strain: Not on file  . Food insecurity:    Worry: Not on file    Inability: Not on file  . Transportation needs:    Medical: Not on file    Non-medical: Not on file  Tobacco Use  . Smoking status: Never Smoker  . Smokeless tobacco: Never Used  Substance and Sexual Activity  . Alcohol use: Not Currently    Frequency: Never    Comment: alcohol dependence prior to 2007  . Drug use: Never  . Sexual activity: Yes  Lifestyle  . Physical activity:    Days per week: Not on file    Minutes per session: Not on file  . Stress: Not on file  Relationships  . Social connections:    Talks on phone: Not on file    Gets together: Not on file    Attends religious service: Not on file    Active member of club or organization: Not on file    Attends meetings of clubs or organizations: Not on file    Relationship status: Not on file  . Intimate partner violence:    Fear of current or ex partner: Not on file    Emotionally abused: Not on file    Physically abused: Not on file    Forced sexual activity: Not on file   Other Topics Concern  . Not on file  Social History Narrative  . Not on file    Past Surgical Hx:  Past Surgical History:  Procedure Laterality Date  . LAPAROTOMY N/A 02/10/2018   Procedure: EXPLORATORY LAPAROTOMY;  Surgeon: Isabel Caprice, MD;  Location: [facility];  Service: Gynecology;  Laterality: N/A;  . OVARIAN CYST REMOVAL    . SALPINGOOPHORECTOMY Bilateral 02/10/2018   Procedure: BILATERAL SALPINGO OOPHORECTOMY; PERITONEAL WASHINGS;  Surgeon: Isabel Caprice, MD;  Location: [facility];  Service: Gynecology;  Laterality: Bilateral;  . TONSILLECTOMY    .  TONSILLECTOMY AND ADENOIDECTOMY  1953    Past Medical Hx:  Past Medical History:  Diagnosis Date  . Complication of anesthesia   . Depression   . History of alcohol dependence ([place]) 2007   no ETOH; resolved since 2007  . History of psychosis 2014   due to sleep disturbance  . Hypertension   . PONV (postoperative nausea and vomiting)     Past Gynecological History:   GYNECOLOGIC HISTORY:  No LMP recorded. Patient is postmenopausal. Menarche: 72 years old P 0 LMP 72yo HRT none  Claims last normal Pap 2016 and got a phone call. States no abnormal pap smears.  Family Hx:  Family History  Problem Relation Age of Onset  . Diabetes Mother   . Breast cancer Mother 17  . Hypertension Mother   . Lung cancer Father        asbestos related  . Heart disease Brother   . Breast cancer Cousin 37       paternal cousin    Review of Systems:  Review of Systems  Constitutional: Positive for unexpected weight change.  HENT: Positive for tinnitus.   Gastrointestinal: Positive for abdominal pain and constipation.  All other systems reviewed and are negative.  Swelling in extremities is ankles.  Abdo pain described as incisional   Vitals:  Blood pressure 130/84, pulse 74, temperature 98.5 F (36.9 C), temperature source Oral, resp. rate 20, height 5\' 6"  (1.676 m), weight 167 lb 14.4 oz (76.2 kg), SpO2 100 %. Body mass  index is 27.1 kg/m.   Physical Exam: ECOG PERFORMANCE STATUS: 1 - Symptomatic but completely ambulatory  General :  Well developed, 72 y.o., female in no apparent distress HEENT:  Normocephalic/atraumatic, symmetric, EOMI, eyelids normal Neck:   No visible masses.  Respiratory:  Respirations unlabored, no use of accessory muscles CV:   Deferred Breast:  Deferred Musculoskeletal: Normal muscle strength. Abdomen:  Wound is clean dry and intact.  No visible masses or protrusion Extremities:  No visible edema or deformities Skin:   Normal inspection Neuro/Psych:  No focal motor deficit, no abnormal mental status. Normal gait. Normal affect. Alert and oriented to person, place, and time  Pelvic:  Deferred    Assessment/Plan: 1. Breast mass o I encouraged her again to move forward with scheduling workup.   o My office is putting in for a diagnostic mammogram which is the next step from my understanding o Although I did review the CT scan of the breast mass and it is concerning I think she could move forward to biopsy but will defer to the imaging center. 2. Pelvic abdominal mass o Thankfully the ovarian cyst was benign  3. She will return to see me in approximately 1 month for a final postoperative check  4. I think her constipation and abdominal pain will improve with additional recovery time.  She has a benign exam in the office today.   Isabel Caprice, MD  03/16/2018, 5:59 PM  Cc: Dr. Evert Kohl. Benjie Karvonen, Referring Ob/Gyn

## 2018-03-17 ENCOUNTER — Other Ambulatory Visit: Payer: Self-pay | Admitting: *Deleted

## 2018-03-17 DIAGNOSIS — C50411 Malignant neoplasm of upper-outer quadrant of right female breast: Secondary | ICD-10-CM | POA: Insufficient documentation

## 2018-03-17 DIAGNOSIS — C50211 Malignant neoplasm of upper-inner quadrant of right female breast: Secondary | ICD-10-CM

## 2018-03-17 DIAGNOSIS — Z170 Estrogen receptor positive status [ER+]: Principal | ICD-10-CM

## 2018-03-23 ENCOUNTER — Inpatient Hospital Stay: Payer: Medicare Other | Attending: Obstetrics | Admitting: Hematology and Oncology

## 2018-03-23 ENCOUNTER — Ambulatory Visit: Payer: Self-pay | Admitting: General Surgery

## 2018-03-23 ENCOUNTER — Ambulatory Visit
Admission: RE | Admit: 2018-03-23 | Discharge: 2018-03-23 | Disposition: A | Discharge status: Home / Self Care | Payer: Medicare Other | Source: Ambulatory Visit | Attending: Radiation Oncology | Admitting: Radiation Oncology

## 2018-03-23 ENCOUNTER — Encounter: Payer: Self-pay | Admitting: Hematology and Oncology

## 2018-03-23 ENCOUNTER — Other Ambulatory Visit: Payer: Self-pay

## 2018-03-23 ENCOUNTER — Inpatient Hospital Stay: Payer: Medicare Other

## 2018-03-23 ENCOUNTER — Encounter: Payer: Self-pay | Admitting: Physical Therapy

## 2018-03-23 ENCOUNTER — Ambulatory Visit: Payer: Medicare Other | Attending: General Surgery | Admitting: Physical Therapy

## 2018-03-23 DIAGNOSIS — C50211 Malignant neoplasm of upper-inner quadrant of right female breast: Secondary | ICD-10-CM | POA: Diagnosis not present

## 2018-03-23 DIAGNOSIS — Z801 Family history of malignant neoplasm of trachea, bronchus and lung: Secondary | ICD-10-CM | POA: Insufficient documentation

## 2018-03-23 DIAGNOSIS — Z79899 Other long term (current) drug therapy: Secondary | ICD-10-CM | POA: Insufficient documentation

## 2018-03-23 DIAGNOSIS — C50411 Malignant neoplasm of upper-outer quadrant of right female breast: Secondary | ICD-10-CM

## 2018-03-23 DIAGNOSIS — R293 Abnormal posture: Secondary | ICD-10-CM | POA: Insufficient documentation

## 2018-03-23 DIAGNOSIS — C50911 Malignant neoplasm of unspecified site of right female breast: Secondary | ICD-10-CM

## 2018-03-23 DIAGNOSIS — Z170 Estrogen receptor positive status [ER+]: Principal | ICD-10-CM

## 2018-03-23 DIAGNOSIS — Z803 Family history of malignant neoplasm of breast: Secondary | ICD-10-CM | POA: Insufficient documentation

## 2018-03-23 LAB — CBC WITH DIFFERENTIAL (CANCER CENTER ONLY)
Basophils Absolute: 0.1 10*3/uL (ref 0.0–0.1)
Basophils Relative: 1 %
EOS ABS: 0.3 10*3/uL (ref 0.0–0.5)
Eosinophils Relative: 4 %
HEMATOCRIT: 39.3 % (ref 34.8–46.6)
HEMOGLOBIN: 13 g/dL (ref 11.6–15.9)
LYMPHS PCT: 21 %
Lymphs Abs: 1.6 10*3/uL (ref 0.9–3.3)
MCH: 28.1 pg (ref 25.1–34.0)
MCHC: 33.2 g/dL (ref 31.5–36.0)
MCV: 84.6 fL (ref 79.5–101.0)
Monocytes Absolute: 0.7 10*3/uL (ref 0.1–0.9)
Monocytes Relative: 10 %
Neutro Abs: 4.9 10*3/uL (ref 1.5–6.5)
Neutrophils Relative %: 64 %
Platelet Count: 234 10*3/uL (ref 145–400)
RBC: 4.64 MIL/uL (ref 3.70–5.45)
RDW: 14.6 % — ABNORMAL HIGH (ref 11.2–14.5)
WBC: 7.5 10*3/uL (ref 3.9–10.3)

## 2018-03-23 LAB — CMP (CANCER CENTER ONLY)
ALK PHOS: 57 U/L (ref 40–150)
ALT: 59 U/L — AB (ref 0–55)
ANION GAP: 9 (ref 3–11)
AST: 34 U/L (ref 5–34)
Albumin: 3.9 g/dL (ref 3.5–5.0)
BILIRUBIN TOTAL: 0.3 mg/dL (ref 0.2–1.2)
BUN: 12 mg/dL (ref 7–26)
CALCIUM: 9.5 mg/dL (ref 8.4–10.4)
CO2: 27 mmol/L (ref 22–29)
CREATININE: 0.89 mg/dL (ref 0.60–1.10)
Chloride: 102 mmol/L (ref 98–109)
GFR, Estimated: >60 mL/min (ref >60)
Glucose, Bld: 101 mg/dL (ref 70–140)
Potassium: 3.8 mmol/L (ref 3.5–5.1)
SODIUM: 138 mmol/L (ref 136–145)
TOTAL PROTEIN: 6.6 g/dL (ref 6.4–8.3)

## 2018-03-23 NOTE — Patient Instructions (Signed)

## 2018-03-23 NOTE — Assessment & Plan Note (Signed)
03/17/2018:Right breast mass noted on a CT scan, on mammogram 2 separate masses were noted.  2.2 cm mass at 11 o'clock position: IDC grade 2 with DCIS, ER 95%, PR 70%, Ki-67 10%,HER-2 negative ratio 1.3; second mass 1.3 cm at 1 o'clock position: IDC grade 2, ER 100%, PR 70%, Ki-67 10%, HER-2 negative ratio 1.6, T2N0 stage Ib clinical stage AJCC 8  Pathology and radiology counseling:Discussed with the patient, the details of pathology including the type of breast cancer,the clinical staging, the significance of ER, PR and HER-2/neu receptors and the implications for treatment. After reviewing the pathology in detail, we proceeded to discuss the different treatment options between surgery, radiation, chemotherapy, antiestrogen therapies.  Recommendations: 1.  Right mastectomy 2. Oncotype DX testing to determine if chemotherapy would be of any benefit followed by 3. Adjuvant antiestrogen therapy  Oncotype counseling: I discussed Oncotype DX test. I explained to the patient that this is a 21 gene panel to evaluate patient tumors DNA to calculate recurrence score. This would help determine whether patient has high risk or intermediate risk or low risk breast cancer. She understands that if her tumor was found to be high risk, she would benefit from systemic chemotherapy. If low risk, no need of chemotherapy. If she was found to be intermediate risk, we would need to evaluate the score as well as other risk factors and determine if an abbreviated chemotherapy may be of benefit.  Return to clinic after surgery to discuss final pathology report and then determine if Oncotype DX testing will need to be sent.

## 2018-03-23 NOTE — Progress Notes (Signed)
[place] NOTE  Patient Care Team: [name], MD as PCP - General (Obstetrics and Gynecology) [name], MD as Consulting Physician (General Surgery) Nicholas Lose, MD as Consulting Physician (Hematology and Oncology) Gery Pray, MD as Consulting Physician (Radiation Oncology)  CHIEF COMPLAINTS/PURPOSE OF CONSULTATION:  Newly diagnosed breast cancer  HISTORY OF PRESENTING ILLNESS:  Kendra [name] 72 y.o. female is here because of recent diagnosis of right breast cancer.  Patient initially had a CT of the abdomen (done to evaluate an ovarian fibroid mass ) which revealed a right breast mass.  This was evaluated by mammogram which revealed 2 separate masses 2.2 cm in size at 11 o'clock position and 1.3 cm at 1 o'clock position.  There were both biopsied and there were 5.7 cm apart.  Axilla was negative.  Biopsy of both of these revealed grade 2 invasive ductal carcinoma that is ER PR positive HER-2 negative with a Ki-67 of 10% with accompanying DCIS.  She was presented this morning in the multidisciplinary tumor board and she is here today to discuss the treatment plan at the [facility] clinic.  I reviewed her records extensively and collaborated the history with the patient.  SUMMARY OF ONCOLOGIC HISTORY:   Malignant neoplasm of upper-inner quadrant of right breast in female, estrogen receptor positive ([place])   03/14/2018 Initial Diagnosis    Right breast mass noted on a CT scan, on mammogram 2 separate masses were noted.  2.2 cm mass at 11 o'clock position: IDC grade 2 with DCIS, ER 95%, PR 70%, Ki-67 10%,HER-2 negative ratio 1.3; second mass 1.3 cm at 1 o'clock position: IDC grade 2, ER 100%, PR 70%, Ki-67 10%, HER-2 negative ratio 1.6, T2N0 stage Ib clinical stage AJCC 8      MEDICAL HISTORY:  Past Medical History:  Diagnosis Date  . Complication of anesthesia   . Depression   . History of alcohol dependence ([place]) 2007   no ETOH; resolved since 2007   . History of psychosis 2014   due to sleep disturbance  . Hypertension   . PONV (postoperative nausea and vomiting)     SURGICAL HISTORY: Past Surgical History:  Procedure Laterality Date  . LAPAROTOMY N/A 02/10/2018   Procedure: EXPLORATORY LAPAROTOMY;  Surgeon: Isabel Caprice, MD;  Location: [facility];  Service: Gynecology;  Laterality: N/A;  . OVARIAN CYST REMOVAL    . SALPINGOOPHORECTOMY Bilateral 02/10/2018   Procedure: BILATERAL SALPINGO OOPHORECTOMY; PERITONEAL WASHINGS;  Surgeon: Isabel Caprice, MD;  Location: [facility];  Service: Gynecology;  Laterality: Bilateral;  . TONSILLECTOMY    . TONSILLECTOMY AND ADENOIDECTOMY  1953    SOCIAL HISTORY: Social History   Socioeconomic History  . Marital status: Married    Spouse name: Not on file  . Number of children: Not on file  . Years of education: Not on file  . Highest education level: Not on file  Occupational History  . Not on file  Social Needs  . Financial resource strain: Not on file  . Food insecurity:    Worry: Not on file    Inability: Not on file  . Transportation needs:    Medical: Not on file    Non-medical: Not on file  Tobacco Use  . Smoking status: Never Smoker  . Smokeless tobacco: Never Used  Substance and Sexual Activity  . Alcohol use: Not Currently    Frequency: Never    Comment: alcohol dependence prior to 2007  . Drug use: Never  . Sexual  activity: Yes  Lifestyle  . Physical activity:    Days per week: Not on file    Minutes per session: Not on file  . Stress: Not on file  Relationships  . Social connections:    Talks on phone: Not on file    Gets together: Not on file    Attends religious service: Not on file    Active member of club or organization: Not on file    Attends meetings of clubs or organizations: Not on file    Relationship status: Not on file  . Intimate partner violence:    Fear of current or ex partner: Not on file    Emotionally abused: Not on file    Physically  abused: Not on file    Forced sexual activity: Not on file  Other Topics Concern  . Not on file  Social History Narrative  . Not on file    FAMILY HISTORY: Family History  Problem Relation Age of Onset  . Diabetes Mother   . Breast cancer Mother 56  . Hypertension Mother   . Lung cancer Father        asbestos related  . Heart disease Brother   . Breast cancer Cousin 63       paternal cousin    ALLERGIES:  is allergic to hydroxyzine and shrimp [shellfish allergy].  MEDICATIONS:  Current Outpatient Medications  Medication Sig Dispense Refill  . buPROPion (WELLBUTRIN XL) 300 MG 24 hr tablet Take 300 mg by mouth daily.    Marland Kitchen docusate sodium (COLACE) 100 MG capsule Take 100 mg by mouth as needed for mild constipation.    Marland Kitchen FLUoxetine (PROZAC) 20 MG capsule Take 20 mg by mouth daily.    Marland Kitchen HYDROcodone-acetaminophen (NORCO/VICODIN) 5-325 MG tablet Take 1 tablet by mouth every 4 (four) hours as needed for moderate pain. 30 tablet 0  . lisinopril-hydrochlorothiazide (PRINZIDE,ZESTORETIC) 20-12.5 MG tablet Take 1 tablet by mouth daily.    Marland Kitchen LORazepam (ATIVAN) 0.5 MG tablet Take 0.5 mg by mouth daily as needed for anxiety.    Marland Kitchen OVER THE COUNTER MEDICATION Take 2 each by mouth at bedtime. OTC Pharma GABA sleep med at bedtime for sleep per pt.      No current facility-administered medications for this visit.     REVIEW OF SYSTEMS:   Constitutional: Denies fevers, chills or abnormal night sweats Eyes: Denies blurriness of vision, double vision or watery eyes Ears, nose, mouth, throat, and face: Denies mucositis or sore throat Respiratory: Denies cough, dyspnea or wheezes Cardiovascular: Denies palpitation, chest discomfort or lower extremity swelling Gastrointestinal:  Denies nausea, heartburn or change in bowel habits Skin: Denies abnormal skin rashes Lymphatics: Denies new lymphadenopathy or easy bruising Neurological:Denies numbness, tingling or new weaknesses Behavioral/Psych: Mood  is stable, no new changes  Breast:  Denies any palpable lumps or discharge All other systems were reviewed with the patient and are negative.  PHYSICAL EXAMINATION: ECOG PERFORMANCE STATUS: 0 - Asymptomatic  Vitals:   03/23/18 1252  BP: 134/81  Pulse: 85  Resp: 18  Temp: 98.8 F (37.1 C)  SpO2: 100%   Filed Weights   03/23/18 1252  Weight: 167 lb 6.4 oz (75.9 kg)    GENERAL:alert, no distress and comfortable SKIN: skin color, texture, turgor are normal, no rashes or significant lesions EYES: normal, conjunctiva are pink and non-injected, sclera clear OROPHARYNX:no exudate, no erythema and lips, buccal mucosa, and tongue normal  NECK: supple, thyroid normal size, non-tender, without nodularity LYMPH:  no palpable lymphadenopathy in the cervical, axillary or inguinal LUNGS: clear to auscultation and percussion with normal breathing effort HEART: regular rate & rhythm and no murmurs and no lower extremity edema ABDOMEN:abdomen soft, non-tender and normal bowel sounds Musculoskeletal:no cyanosis of digits and no clubbing  PSYCH: alert & oriented x 3 with fluent speech NEURO: no focal motor/sensory deficits BREAST: No palpable nodules in breast. No palpable axillary or supraclavicular lymphadenopathy (exam performed in the presence of a chaperone)   LABORATORY DATA:  I have reviewed the data as listed Lab Results  Component Value Date   WBC 7.5 03/23/2018   HGB 13.0 03/23/2018   HCT 39.3 03/23/2018   MCV 84.6 03/23/2018   PLT 234 03/23/2018   Lab Results  Component Value Date   NA 138 03/23/2018   K 3.8 03/23/2018   CL 102 03/23/2018   CO2 27 03/23/2018    RADIOGRAPHIC STUDIES: I have personally reviewed the radiological reports and agreed with the findings in the report.  ASSESSMENT AND PLAN:  Malignant neoplasm of upper-inner quadrant of right breast in female, estrogen receptor positive ([place]) 03/17/2018:Right breast mass noted on a CT scan, on mammogram 2  separate masses were noted.  2.2 cm mass at 11 o'clock position: IDC grade 2 with DCIS, ER 95%, PR 70%, Ki-67 10%,HER-2 negative ratio 1.3; second mass 1.3 cm at 1 o'clock position: IDC grade 2, ER 100%, PR 70%, Ki-67 10%, HER-2 negative ratio 1.6, T2N0 stage Ib clinical stage AJCC 8  Pathology and radiology counseling:Discussed with the patient, the details of pathology including the type of breast cancer,the clinical staging, the significance of ER, PR and HER-2/neu receptors and the implications for treatment. After reviewing the pathology in detail, we proceeded to discuss the different treatment options between surgery, radiation, chemotherapy, antiestrogen therapies.  Recommendations: 1.  2 lumpectomies versus right mastectomy 2. Adjuvant radiation if she undergoes lumpectomy 3. Oncotype DX testing to determine if chemotherapy would be of any benefit followed by 4. Adjuvant antiestrogen therapy  Oncotype counseling: I discussed Oncotype DX test. I explained to the patient that this is a 21 gene panel to evaluate patient tumors DNA to calculate recurrence score. This would help determine whether patient has high risk or intermediate risk or low risk breast cancer. She understands that if her tumor was found to be high risk, she would benefit from systemic chemotherapy. If low risk, no need of chemotherapy. If she was found to be intermediate risk, we would need to evaluate the score as well as other risk factors and determine if an abbreviated chemotherapy may be of benefit.  Return to clinic after surgery to discuss final pathology report  All questions were answered. The patient knows to call the clinic with any problems, questions or concerns.    Harriette Ohara, MD 03/23/18

## 2018-03-23 NOTE — Progress Notes (Signed)
Radiation Oncology         ([phone]) [phone] ________________________________  Initial Outpatient Consultation  Name: Kendra [name] MRN: [id number]  Date: 03/23/2018  DOB: Mar 01, 1946  CC:Kendra Fallen, MD  Kendra Seltzer, MD   REFERRING PHYSICIAN: [name], MD  DIAGNOSIS: The primary encounter diagnosis was Malignant neoplasm of upper-outer quadrant of right breast in female, estrogen receptor positive ([place]). A diagnosis of Malignant neoplasm of upper-inner quadrant of right breast in female, estrogen receptor positive ([place]) was also pertinent to this visit. Stage IB (cT2, cN0, cM0) invasive ductal carcinoma of the UOQ of the right breast, ER+, PR+, Her2-, Grade 2  Stage IA (cT1c, cN0, cM0) invasive ductal carcinoma of the UIQ of the right breast, ER+, PR+, Her2-, Grade 2  HISTORY OF PRESENT ILLNESS::Kendra [name] is a 72 y.o. female who presented to her gynecologist with abdominal pain and distention. Subsequent CT of the abdomen and pelvis on 02/03/18 showed an irregular mass within the right breast measuring 2.6 cm concerning for possibility of primary breast malignancy. A large (28.5 x 17 x 25 cm) cystic mass with enhancing septations and nodularity was also noted. 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   Diagnostic mammogram on 03/14/18 showed a suspicious mass in the right breast at the 11 o'clock position, 7 cmfn, measuring 1.7 x 2.2 x 2.1 cm. There is an adjacent anechoic cyst measuring 1.2 cm. The mass and cyst together measure 2.9 cm. There is a hypoechoic mass in the right breast at 1 o'clock 6 cmfn measuring 9 x 8 x 1.3 mm. The masses are located 3.9 cm apart. Anechoic cyst at 2:30 o'clock 7 cm from the nipple measuring 7 x 5 x 3 mm. There is an anechoic cyst at 2 o'clock 7 cm from the nipple measuring 7 x 3 x 6 mm.   The patient underwent biopsy of the two masses on 03/14/18. Pathology of the 11 o'clock mass shows  invasive ductal carcinoma, grade I. Receptor status is ER 95%, PR 70%, Her2 negative, and Ki67 10%. The pathology of the 1 o'clock mass shows invasive ductal carcinoma, grade 2, with a slightly different morphology from the 11 o'clock mass. Receptor status is ER 100%, PR 70%, Her2 negative, and Ki67 10%.    Malignant neoplasm of upper-inner quadrant of right breast in female, estrogen receptor positive (Kendra [name])   03/14/2018 Initial Diagnosis    Right breast mass noted on a CT scan, on mammogram 2 separate masses were noted.  2.2 cm mass at 11 o'clock position: IDC grade 2 with DCIS, ER 95%, PR 70%, Ki-67 10%,HER-2 negative ratio 1.3; second mass 1.3 cm at 1 o'clock position: IDC grade 2, ER 100%, PR 70%, Ki-67 10%, HER-2 negative ratio 1.6, T2N0 stage Ib clinical stage AJCC 8       The patient presents to multidisciplinary breast conference today to be evaluated for radiation therapy.   Of note patient has a family history of mother with breast cancer (2) and paternal cousin (35).  The patient is positive for night sweats, glasses, contacts, ringing in ears, urinary tract infections, breast lump, moles, bruise easily, joint pain, and depression.   PREVIOUS RADIATION THERAPY: No  PAST MEDICAL HISTORY:  has a past medical history of Complication of anesthesia, Depression, History of alcohol dependence ([place]) (2007), History of psychosis (2014), Hypertension, and PONV (postoperative nausea and vomiting).    PAST SURGICAL HISTORY: Past Surgical History:  Procedure Laterality Date  . LAPAROTOMY  N/A 02/10/2018   Procedure: EXPLORATORY LAPAROTOMY;  Surgeon: Isabel Caprice, MD;  Location: [facility];  Service: Gynecology;  Laterality: N/A;  . OVARIAN CYST REMOVAL    . SALPINGOOPHORECTOMY Bilateral 02/10/2018   Procedure: BILATERAL SALPINGO OOPHORECTOMY; PERITONEAL WASHINGS;  Surgeon: Isabel Caprice, MD;  Location: [facility];  Service: Gynecology;  Laterality: Bilateral;  . TONSILLECTOMY    .  TONSILLECTOMY AND ADENOIDECTOMY  1953    FAMILY HISTORY: family history includes Breast cancer (age of onset: 25) in her cousin; Breast cancer (age of onset: 78) in her mother; Diabetes in her mother; Heart disease in her brother; Hypertension in her mother; Lung cancer in her father.  SOCIAL HISTORY:  reports that she has never smoked. She has never used smokeless tobacco. She reports that she drank alcohol. She reports that she does not use drugs.  ALLERGIES: Hydroxyzine and Shrimp [shellfish allergy]  MEDICATIONS:  Current Outpatient Medications  Medication Sig Dispense Refill  . buPROPion (WELLBUTRIN XL) 300 MG 24 hr tablet Take 300 mg by mouth daily.    Marland Kitchen docusate sodium (COLACE) 100 MG capsule Take 100 mg by mouth as needed for mild constipation.    Marland Kitchen FLUoxetine (PROZAC) 20 MG capsule Take 20 mg by mouth daily.    Marland Kitchen HYDROcodone-acetaminophen (NORCO/VICODIN) 5-325 MG tablet Take 1 tablet by mouth every 4 (four) hours as needed for moderate pain. 30 tablet 0  . lisinopril-hydrochlorothiazide (PRINZIDE,ZESTORETIC) 20-12.5 MG tablet Take 1 tablet by mouth daily.    Marland Kitchen LORazepam (ATIVAN) 0.5 MG tablet Take 0.5 mg by mouth daily as needed for anxiety.    Marland Kitchen OVER THE COUNTER MEDICATION Take 2 each by mouth at bedtime. OTC [employer]arma GABA sleep med at bedtime for sleep per pt.      No current facility-administered medications for this encounter.     REVIEW OF SYSTEMS: A 10+ POINT REVIEW OF SYSTEMS WAS OBTAINED including neurology, dermatology, psychiatry, cardiac, respiratory, lymph, extremities, GI, GU, musculoskeletal, constitutional, reproductive, HEENT. All pertinent positives are noted in the HPI. All others are negative.    PHYSICAL EXAM:   Lungs are clear to auscultation bilaterally. Heart has regular rate and rhythm. No palpable cervical, supraclavicular, or axillary adenopathy. Abdomen soft, non-tender, normal bowel sounds.  Left breast no palpable masses, nipple discharge, or  bleeding. Right breast the patient has some bruising in the upper outer quadrant and upper inner quadrant. A firm area of induration in the upper outer quadrant measuring approximately 2.5 x 3 cm.   ECOG = 0  0 - Asymptomatic (Fully active, able to carry on all predisease activities without restriction)  1 - Symptomatic but completely ambulatory (Restricted in physically strenuous activity but ambulatory and able to carry out work of a light or sedentary nature. For example, light housework, office work)  2 - Symptomatic, <50% in bed during the day (Ambulatory and capable of all self care but unable to carry out any work activities. Up and about more than 50% of waking hours)  3 - Symptomatic, >50% in bed, but not bedbound (Capable of only limited self-care, confined to bed or chair 50% or more of waking hours)  4 - Bedbound (Completely disabled. Cannot carry on any self-care. Totally confined to bed or chair)  5 - Death   Eustace Pen MM, Creech RH, Tormey DC, et al. [phone]). "Toxicity and response criteria of the [facility]". [place] Oncol. 5 (6): 649-55  LABORATORY DATA:  Lab Results  Component Value Date  WBC 7.5 03/23/2018   HGB 13.0 03/23/2018   HCT 39.3 03/23/2018   MCV 84.6 03/23/2018   PLT 234 03/23/2018   NEUTROABS 4.9 03/23/2018   Lab Results  Component Value Date   NA 138 03/23/2018   K 3.8 03/23/2018   CL 102 03/23/2018   CO2 27 03/23/2018   GLUCOSE 101 03/23/2018   CREATININE 0.89 03/23/2018   CALCIUM 9.5 03/23/2018      RADIOGRAPHY: US Breast Ltd Uni Left Inc Axilla  Result Date: 03/14/2018 CLINICAL DATA:  Patient had a recent CT scan of the abdomen and pelvis showing a suspicious mass in the right breast. EXAM: DIGITAL DIAGNOSTIC BILATERAL MAMMOGRAM WITH CAD AND TOMO ULTRASOUND BILATERAL BREAST COMPARISON:  None. ACR Breast Density Category b: There are scattered areas of fibroglandular density. FINDINGS: There is a 3.1 cm spiculated mass  in the upper outer quadrant of the right breast and a 1.4 cm mass in the upper inner quadrant of the right breast. There is a 7 mm bilobed mass in the lateral aspect of the left breast. There are no malignant type microcalcifications. Mammographic images were processed with CAD. On physical exam, I palpate a discrete mass in the right breast 11 o'clock 7 cm from the nipple. No mass is palpated in the lateral aspect of the left breast. Targeted ultrasound is performed, showing an irregular hypoechoic mass in the right breast at 11 o'clock 7 cm from the nipple measuring 1.7 x 2.2 x 2.1 cm. There is an adjacent anechoic cyst measuring 1.2 cm. The mass and cyst together measure 2.9 cm. There is a hypoechoic mass in the right breast at 1 o'clock 6 cm from the nipple measuring 9 x 8 x 1.3 mm. The masses are located 3.9 cm apart. Sonographic evaluation the right axilla does not show any enlarged adenopathy. Sonographic evaluation of the left breast shows an anechoic cyst at 2:30 7 cm from the nipple measuring 7 x 5 x 3 mm. There is an anechoic cyst at 2 o'clock 7 cm from the nipple measuring 7 x 3 x 6 mm. IMPRESSION: Two suspicious masses in the 11 o'clock and 1 o'clock region of the right breast. RECOMMENDATION: Ultrasound-guided core biopsies of the right breast masses is recommended. I have discussed the findings and recommendations with the patient. Results were also provided in writing at the conclusion of the visit. If applicable, a reminder letter will be sent to the patient regarding the next appointment. BI-RADS CATEGORY  5: Highly suggestive of malignancy. Electronically Signed   By: Lillia Mountain M.D.   On: 03/14/2018 12:45   US Breast Ltd Uni Right Inc Axilla  Result Date: 03/14/2018 CLINICAL DATA:  Patient had a recent CT scan of the abdomen and pelvis showing a suspicious mass in the right breast. EXAM: DIGITAL DIAGNOSTIC BILATERAL MAMMOGRAM WITH CAD AND TOMO ULTRASOUND BILATERAL BREAST COMPARISON:  None.  ACR Breast Density Category b: There are scattered areas of fibroglandular density. FINDINGS: There is a 3.1 cm spiculated mass in the upper outer quadrant of the right breast and a 1.4 cm mass in the upper inner quadrant of the right breast. There is a 7 mm bilobed mass in the lateral aspect of the left breast. There are no malignant type microcalcifications. Mammographic images were processed with CAD. On physical exam, I palpate a discrete mass in the right breast 11 o'clock 7 cm from the nipple. No mass is palpated in the lateral aspect of the left breast. Targeted ultrasound is performed,  showing an irregular hypoechoic mass in the right breast at 11 o'clock 7 cm from the nipple measuring 1.7 x 2.2 x 2.1 cm. There is an adjacent anechoic cyst measuring 1.2 cm. The mass and cyst together measure 2.9 cm. There is a hypoechoic mass in the right breast at 1 o'clock 6 cm from the nipple measuring 9 x 8 x 1.3 mm. The masses are located 3.9 cm apart. Sonographic evaluation the right axilla does not show any enlarged adenopathy. Sonographic evaluation of the left breast shows an anechoic cyst at 2:30 7 cm from the nipple measuring 7 x 5 x 3 mm. There is an anechoic cyst at 2 o'clock 7 cm from the nipple measuring 7 x 3 x 6 mm. IMPRESSION: Two suspicious masses in the 11 o'clock and 1 o'clock region of the right breast. RECOMMENDATION: Ultrasound-guided core biopsies of the right breast masses is recommended. I have discussed the findings and recommendations with the patient. Results were also provided in writing at the conclusion of the visit. If applicable, a reminder letter will be sent to the patient regarding the next appointment. BI-RADS CATEGORY  5: Highly suggestive of malignancy. Electronically Signed   By: Lillia Mountain M.D.   On: 03/14/2018 12:45   Mm Diag Breast Tomo Bilateral  Result Date: 03/14/2018 CLINICAL DATA:  Patient had a recent CT scan of the abdomen and pelvis showing a suspicious mass in the  right breast. EXAM: DIGITAL DIAGNOSTIC BILATERAL MAMMOGRAM WITH CAD AND TOMO ULTRASOUND BILATERAL BREAST COMPARISON:  None. ACR Breast Density Category b: There are scattered areas of fibroglandular density. FINDINGS: There is a 3.1 cm spiculated mass in the upper outer quadrant of the right breast and a 1.4 cm mass in the upper inner quadrant of the right breast. There is a 7 mm bilobed mass in the lateral aspect of the left breast. There are no malignant type microcalcifications. Mammographic images were processed with CAD. On physical exam, I palpate a discrete mass in the right breast 11 o'clock 7 cm from the nipple. No mass is palpated in the lateral aspect of the left breast. Targeted ultrasound is performed, showing an irregular hypoechoic mass in the right breast at 11 o'clock 7 cm from the nipple measuring 1.7 x 2.2 x 2.1 cm. There is an adjacent anechoic cyst measuring 1.2 cm. The mass and cyst together measure 2.9 cm. There is a hypoechoic mass in the right breast at 1 o'clock 6 cm from the nipple measuring 9 x 8 x 1.3 mm. The masses are located 3.9 cm apart. Sonographic evaluation the right axilla does not show any enlarged adenopathy. Sonographic evaluation of the left breast shows an anechoic cyst at 2:30 7 cm from the nipple measuring 7 x 5 x 3 mm. There is an anechoic cyst at 2 o'clock 7 cm from the nipple measuring 7 x 3 x 6 mm. IMPRESSION: Two suspicious masses in the 11 o'clock and 1 o'clock region of the right breast. RECOMMENDATION: Ultrasound-guided core biopsies of the right breast masses is recommended. I have discussed the findings and recommendations with the patient. Results were also provided in writing at the conclusion of the visit. If applicable, a reminder letter will be sent to the patient regarding the next appointment. BI-RADS CATEGORY  5: Highly suggestive of malignancy. Electronically Signed   By: Lillia Mountain M.D.   On: 03/14/2018 12:45   Mm Clip Placement Right  Result Date:  03/14/2018 CLINICAL DATA:  Post ultrasound-guided core needle biopsy of  right breast 11 o'clock and 1 o'clock masses. EXAM: DIAGNOSTIC RIGHT MAMMOGRAM POST ULTRASOUND BIOPSY COMPARISON:  Previous exam(s). FINDINGS: Mammographic images were obtained following ultrasound guided biopsy of right breast masses. Two-view mammography demonstrates presence of ribbon shaped marker within the 11 o'clock mass and coil shaped marker within the 1 o'clock mass. Both markers are in expected position. Post biopsy changes are seen. IMPRESSION: Successful placement of post biopsy markers, post ultrasound-guided core needle biopsy of the right breast: 11 o'clock--ribbon shaped marker 1 o'clock--coil shaped marker. Final Assessment: Post Procedure Mammograms for Marker Placement Electronically Signed   By: Fidela Salisbury M.D.   On: 03/14/2018 14:45   [place] Rt Breast Bx W Loc Dev 1st Lesion Img Bx Spec US Guide  Addendum Date: 03/15/2018   ADDENDUM REPORT: 03/15/2018 14:52 ADDENDUM: Pathology revealed GRADE I INVASIVE DUCTAL CARCINOMA, DUCTAL CARCINOMA IN SITU WITH NECROSIS of RIGHT breast, 11 o'clock. This was found to be concordant by Dr. Fidela Salisbury. Pathology revealed GRADE II INVASIVE DUCTAL CARCINOMA of RIGHT breast, 1 o'clock. This was found to be concordant by Dr Fidela Salisbury. Pathology results were discussed with the patient by telephone. The patient reported doing well after the biopsy with tenderness at the site. Post biopsy instructions and care were reviewed and questions were answered. The patient was encouraged to call [facility] for any additional concerns. The patient was referred to [facility] on Mar 23, 2018. Pathology results reported by Roselind Messier, RN on 03/15/2018. Electronically Signed   By: Fidela Salisbury M.D.   On: 03/15/2018 14:52   Result Date: 03/15/2018 CLINICAL DATA:  Right  breast 11 o'clock and 1 o'clock masses. EXAM: ULTRASOUND GUIDED RIGHT BREAST CORE NEEDLE BIOPSY COMPARISON:  Previous exam(s). FINDINGS: I met with the patient and we discussed the procedure of ultrasound-guided biopsy, including benefits and alternatives. We discussed the high likelihood of a successful procedure. We discussed the risks of the procedure, including infection, bleeding, tissue injury, clip migration, and inadequate sampling. Informed written consent was given. The usual time-out protocol was performed immediately prior to the procedure. Lesion quadrant: Upper outer quadrant and upper inner quadrant. Using sterile technique and 1% Lidocaine as local anesthetic, under direct ultrasound visualization, a 14 gauge spring-loaded device was used to perform biopsy of right breast 11 o'clock mass using a inferior approach. At the conclusion of the procedure a ribbon shaped tissue marker clip was deployed into the biopsy cavity. Next, using sterile technique and 1% Lidocaine as local anesthetic, under direct ultrasound visualization, a 14 gauge spring-loaded device was used to perform biopsy of right breast 1 o'clock mass using a medial approach. At the conclusion of the procedure a coil shaped tissue marker clip was deployed into the biopsy cavity. Follow up 2 view mammogram was performed and dictated separately. IMPRESSION: Ultrasound guided biopsy of right breast 11 o'clock and 1 o'clock masses. No apparent complications. Electronically Signed: By: Fidela Salisbury M.D. On: 03/14/2018 14:34   [place] Rt Breast Bx W Loc Dev Ea Add Lesion Img Bx Spec US Guide  Addendum Date: 03/15/2018   ADDENDUM REPORT: 03/15/2018 14:52 ADDENDUM: Pathology revealed GRADE I INVASIVE DUCTAL CARCINOMA, DUCTAL CARCINOMA IN SITU WITH NECROSIS of RIGHT breast, 11 o'clock. This was found to be concordant by Dr. Fidela Salisbury. Pathology revealed GRADE II INVASIVE DUCTAL CARCINOMA of RIGHT breast, 1 o'clock. This was found to  be concordant by Dr Fidela Salisbury. Pathology results were  discussed with the patient by telephone. The patient reported doing well after the biopsy with tenderness at the site. Post biopsy instructions and care were reviewed and questions were answered. The patient was encouraged to call [facility] for any additional concerns. The patient was referred to [facility] at [facility] on Mar 23, 2018. Pathology results reported by Roselind Messier, RN on 03/15/2018. Electronically Signed   By: Fidela Salisbury M.D.   On: 03/15/2018 14:52   Result Date: 03/15/2018 CLINICAL DATA:  Right breast 11 o'clock and 1 o'clock masses. EXAM: ULTRASOUND GUIDED RIGHT BREAST CORE NEEDLE BIOPSY COMPARISON:  Previous exam(s). FINDINGS: I met with the patient and we discussed the procedure of ultrasound-guided biopsy, including benefits and alternatives. We discussed the high likelihood of a successful procedure. We discussed the risks of the procedure, including infection, bleeding, tissue injury, clip migration, and inadequate sampling. Informed written consent was given. The usual time-out protocol was performed immediately prior to the procedure. Lesion quadrant: Upper outer quadrant and upper inner quadrant. Using sterile technique and 1% Lidocaine as local anesthetic, under direct ultrasound visualization, a 14 gauge spring-loaded device was used to perform biopsy of right breast 11 o'clock mass using a inferior approach. At the conclusion of the procedure a ribbon shaped tissue marker clip was deployed into the biopsy cavity. Next, using sterile technique and 1% Lidocaine as local anesthetic, under direct ultrasound visualization, a 14 gauge spring-loaded device was used to perform biopsy of right breast 1 o'clock mass using a medial approach. At the conclusion of the procedure a coil shaped tissue marker clip was deployed into the biopsy  cavity. Follow up 2 view mammogram was performed and dictated separately. IMPRESSION: Ultrasound guided biopsy of right breast 11 o'clock and 1 o'clock masses. No apparent complications. Electronically Signed: By: Fidela Salisbury M.D. On: 03/14/2018 14:34      IMPRESSION:  Stage IB (cT2, cN0, cM0) invasive ductal carcinoma of the UOQ of the right breast, ER+, PR+, Her2-, Grade 2  Stage IA (cT1c, cN0, cM0) invasive ductal carcinoma of the UIQ of the right breast, ER+, PR+, Her2-, Grade 2  The patient's case was discussed at multidisciplinary breast clinic today. The consensus was the patient will undergo right breast double lumpectomy with sentinel lymph node biopsy. This will be followed by adjuvant radiation therapy to the right breast. We discussed the course of treatment, side effects, and potential long term toxicities of radiotherapy. The patient appears to understand and wishes to proceed. The patient will be scheduled to undergo oncotype testing to determine the role of chemotherapy. Regardless of testing, she will be started on 5 years of hormone therapy following treatment. The patient also qualifies for genetic testing and will be evaluated on 03/24/18.  PLAN: The patient will be scheduled for right breast double lumpectomy. I will await for her referral back to me post-operatively to discuss radiation therapy further and plan for CT simulation and treatment planning.  She may consider radiation therapy at the [facility] regional medical center since she lives in [place].     ------------------------------------------------  Blair Promise, PhD, MD  This document serves as a record of services personally performed by Gery Pray, MD. It was created on his behalf by Bethann Humble, a trained medical scribe. The creation of this record is based on the scribe's personal observations and the provider's statements to them. This document has been checked and approved by the attending  provider.

## 2018-03-23 NOTE — Therapy (Signed)
[place], [place], [address] Phone: [phone]   Fax:  [phone]  Physical Therapy Evaluation  Patient Details  Name: Kendra [name] MRN: [id number] Date of Birth: 27-Mar-1946 Referring Provider: Dr. [name]   Encounter Date: 03/23/2018  PT End of Session - 03/23/18 1501    Visit Number  1    Number of Visits  2    Date for PT Re-Evaluation  05/18/18    PT Start Time  1505    PT Stop Time  1535    PT Time Calculation (min)  30 min    Activity Tolerance  Patient tolerated treatment well    Behavior During Therapy  [facility] for tasks assessed/performed       Past Medical History:  Diagnosis Date  . Complication of anesthesia   . Depression   . History of alcohol dependence ([place]) 2007   no ETOH; resolved since 2007  . History of psychosis 2014   due to sleep disturbance  . Hypertension   . PONV (postoperative nausea and vomiting)     Past Surgical History:  Procedure Laterality Date  . LAPAROTOMY N/A 02/10/2018   Procedure: EXPLORATORY LAPAROTOMY;  Surgeon: Isabel Caprice, MD;  Location: [facility];  Service: Gynecology;  Laterality: N/A;  . OVARIAN CYST REMOVAL    . SALPINGOOPHORECTOMY Bilateral 02/10/2018   Procedure: BILATERAL SALPINGO OOPHORECTOMY; PERITONEAL WASHINGS;  Surgeon: Isabel Caprice, MD;  Location: [facility];  Service: Gynecology;  Laterality: Bilateral;  . TONSILLECTOMY    . TONSILLECTOMY AND ADENOIDECTOMY  1953    There were no vitals filed for this visit.   Subjective Assessment - 03/23/18 1456    Subjective  Patient reports she is here today to be seen by her medical team for her newly diagnosed right breast cancer.    Patient is accompained by:  Family member    Pertinent History  Patient was diagnosed on 03/14/18 with right grade II invasive ductal carcinoma breast cancer. There are 2 areas measuring 1.3 cm in the upper inner quadrant and 2.2 cm in the upper outer  quadrant. They are ER/PR positive and HEr2 negative with a ki67 of 10%.     Patient Stated Goals  Reduce lymphedema risk and learn post op shoulder ROM HEP    Currently in Pain?  No/denies         [facility] PT Assessment - 03/23/18 0001      Assessment   Medical Diagnosis  Right breast cancer    Referring Provider  Dr. [name]    Onset Date/Surgical Date  03/14/18    Hand Dominance  Right    Prior Therapy  none      Precautions   Precautions  Other (comment)    Precaution Comments  active cancer      Restrictions   Weight Bearing Restrictions  No      Balance Screen   Has the patient fallen in the past 6 months  No    Has the patient had a decrease in activity level because of a fear of falling?   No    Is the patient reluctant to leave their home because of a fear of falling?   No      Home Environment   Living Environment  Private residence    Living Arrangements  Spouse/significant other    Available Help at Discharge  Family      Prior Function   Level of Independence  Independent    Vocation  Retired    Leisure  She walks 20 min, 3 times per day      Cognition   Overall Cognitive Status  Within Functional Limits for tasks assessed      Posture/Postural Control   Posture/Postural Control  Postural limitations    Postural Limitations  Rounded Shoulders;Forward head;Flexed trunk    Posture Comments  Flexed trunk is due to recent major abdominal surgery.      ROM / Strength   AROM / PROM / Strength  AROM;Strength      AROM   AROM Assessment Site  Shoulder;Cervical    Right/Left Shoulder  Right;Left    Right Shoulder Extension  44 Degrees    Right Shoulder Flexion  150 Degrees    Right Shoulder ABduction  160 Degrees    Right Shoulder Internal Rotation  66 Degrees    Right Shoulder External Rotation  73 Degrees    Left Shoulder Extension  50 Degrees    Left Shoulder Flexion  145 Degrees    Left Shoulder ABduction  160 Degrees    Left Shoulder Internal  Rotation  61 Degrees    Left Shoulder External Rotation  75 Degrees    Cervical Flexion  WNL    Cervical Extension  WNL    Cervical - Right Side Bend  WNL    Cervical - Left Side Bend  50% limited    Cervical - Right Rotation  WNL    Cervical - Left Rotation  WNL      Strength   Overall Strength  Within functional limits for tasks performed        LYMPHEDEMA/ONCOLOGY QUESTIONNAIRE - 03/23/18 1500      Type   Cancer Type  Right breast cancer      Lymphedema Assessments   Lymphedema Assessments  Upper extremities      Right Upper Extremity Lymphedema   10 cm Proximal to Olecranon Process  26.1 cm    Olecranon Process  22.8 cm    10 cm Proximal to Ulnar Styloid Process  21.6 cm    Just Proximal to Ulnar Styloid Process  14.5 cm    Across Hand at [employer]  17.9 cm    At [place] of 2nd Digit  6.2 cm      Left Upper Extremity Lymphedema   10 cm Proximal to Olecranon Process  25 cm    Olecranon Process  22.5 cm    15 cm Proximal to Ulnar Styloid Process  20.5 cm    10 cm Proximal to Ulnar Styloid Process  14.7 cm    Just Proximal to Ulnar Styloid Process  17.5 cm    Across Hand at [employer]  6 cm             Objective measurements completed on examination: See above findings.    Patient was instructed today in a home exercise program today for post op shoulder range of motion. These included active assist shoulder flexion in sitting, scapular retraction, wall walking with shoulder abduction, and hands behind head external rotation.  She was encouraged to do these twice a day, holding 3 seconds and repeating 5 times when permitted by her physician.     PT Education - 03/23/18 1500    Education provided  Yes    Education Details  Lymphedema risk reduction and post op shoulder ROM HEP    Person(s) Educated  Patient    Methods  Explanation;Demonstration;Handout  Comprehension  Returned demonstration;Verbalized understanding          PT Long Term  Goals - 03/23/18 1541      PT LONG TERM GOAL #1   Title  Patient will demonstrate she has returned to baseline post operatively related to shoulder ROM and function.    Time  [facility] Goals - 03/23/18 1504      Patient will be able to verbalize understanding of pertinent lymphedema risk reduction practices relevant to her diagnosis specifically related to skin care.   Time  1    Period  Days    Status  Achieved      Patient will be able to return demonstrate and/or verbalize understanding of the post-op home exercise program related to regaining shoulder range of motion.   Time  1    Period  Days    Status  Achieved      Patient will be able to verbalize understanding of the importance of attending the postoperative After Breast Cancer Class for further lymphedema risk reduction education and therapeutic exercise.   Time  1    Period  Days    Status  Achieved            Plan - 03/23/18 1501    Clinical Impression Statement  Patient was diagnosed on 03/14/18 with right grade II invasive ductal carcinoma breast cancer. There are 2 areas measuring 1.3 cm in the upper inner quadrant and 2.2 cm in the upper outer quadrant. They are ER/PR positive and HER2 negative with a ki67 of 10%. Her cancer was found after having an abdominal scan performed due to recent surgery. She had a 15# ovarian cyst removed on 02/10/18 and is still recovering from that surgery. Her multidisciplinary medical team met prior to her assessments to determine a recommended treatment plan. She is planning to have a right double lumpectomy and sentinel node biopsy followed by Oncotype testing, radiation, and anti-estrogen therapy. She will benefit from a post op PT reassessment to determine needs.    History and Personal Factors relevant to plan of care:  Major abdominal surgery 02/10/18 for large ovarian cyst.    Clinical Presentation  Stable    Clinical Decision Making   Low    Rehab Potential  Excellent    Clinical Impairments Affecting Rehab Potential  None    PT Frequency  -- Eval and 1 f/u visit    PT Treatment/Interventions  ADLs/Self Care Home Management;Therapeutic exercise;Patient/family education    PT Next Visit Plan  Will reassess 3-4 weeks post op to determine PT needs    PT Home Exercise Plan  Post op shoulder ROM HEP    Consulted and Agree with Plan of Care  Patient       Patient will benefit from skilled therapeutic intervention in order to improve the following deficits and impairments:  Decreased knowledge of precautions, Impaired UE functional use, Decreased range of motion, Postural dysfunction, Pain  Visit Diagnosis: Malignant neoplasm of upper-inner quadrant of right breast in female, estrogen receptor positive ([place]) - Plan: PT plan of care cert/re-cert  Abnormal posture - Plan: PT plan of care cert/re-cert   Patient will follow up at outpatient cancer rehab 3-4 weeks following surgery.  If the patient requires physical therapy at that time, a specific plan will be dictated and sent to the referring physician for approval. The patient was educated  today on appropriate basic range of motion exercises to begin post operatively and the importance of attending the After Breast Cancer class following surgery.  Patient was educated today on lymphedema risk reduction practices as it pertains to recommendations that will benefit the patient immediately following surgery.  She verbalized good understanding.     Problem List Patient Active Problem List   Diagnosis Date Noted  . Malignant neoplasm of upper-outer quadrant of right breast in female, estrogen receptor positive ([place]) 03/17/2018  . Malignant neoplasm of upper-inner quadrant of right breast in female, estrogen receptor positive ([place]) 03/17/2018  . Pelvic mass in female 02/07/2018  . Breast mass, right 02/07/2018  . History of alcohol dependence ([place]) 02/07/2018  . History of  psychosis 02/07/2018  . History of insomnia 02/07/2018   Annia Friendly, PT 03/23/18 3:46 PM  [place], [place], [address] Phone: [phone]   Fax:  [phone]  Name: Kendra [name] MRN: [id number] Date of Birth: 1946-08-17

## 2018-03-24 ENCOUNTER — Encounter: Payer: Self-pay | Admitting: General Practice

## 2018-03-24 ENCOUNTER — Encounter: Payer: Self-pay | Admitting: Genetics

## 2018-03-24 ENCOUNTER — Inpatient Hospital Stay (HOSPITAL_BASED_OUTPATIENT_CLINIC_OR_DEPARTMENT_OTHER): Payer: Medicare Other | Admitting: Obstetrics

## 2018-03-24 ENCOUNTER — Encounter: Payer: Self-pay | Admitting: Obstetrics

## 2018-03-24 ENCOUNTER — Inpatient Hospital Stay (HOSPITAL_BASED_OUTPATIENT_CLINIC_OR_DEPARTMENT_OTHER): Payer: Medicare Other | Admitting: Genetics

## 2018-03-24 VITALS — BP 124/64 | HR 77 | Temp 98.5°F | Resp 17 | Ht 66.0 in | Wt 166.8 lb

## 2018-03-24 DIAGNOSIS — R1900 Intra-abdominal and pelvic swelling, mass and lump, unspecified site: Secondary | ICD-10-CM

## 2018-03-24 DIAGNOSIS — Z170 Estrogen receptor positive status [ER+]: Secondary | ICD-10-CM | POA: Diagnosis not present

## 2018-03-24 DIAGNOSIS — C50211 Malignant neoplasm of upper-inner quadrant of right female breast: Secondary | ICD-10-CM | POA: Diagnosis not present

## 2018-03-24 DIAGNOSIS — Z801 Family history of malignant neoplasm of trachea, bronchus and lung: Secondary | ICD-10-CM | POA: Diagnosis not present

## 2018-03-24 DIAGNOSIS — C50411 Malignant neoplasm of upper-outer quadrant of right female breast: Secondary | ICD-10-CM

## 2018-03-24 DIAGNOSIS — Z803 Family history of malignant neoplasm of breast: Secondary | ICD-10-CM | POA: Diagnosis not present

## 2018-03-24 HISTORY — DX: Family history of malignant neoplasm of breast: Z80.3

## 2018-03-24 HISTORY — DX: Family history of malignant neoplasm of trachea, bronchus and lung: Z80.1

## 2018-03-24 NOTE — Progress Notes (Signed)
Progress Note: Gyn-Onc  Consult was originally requested by Dr. Benjie Karvonen  CC:  Chief Complaint  Patient presents with  . Pelvic mass in female    HPI: [name]  is a very nice 72 y.o.  P0  I did take her to the operating room 02/10/2018 at which time exploration laparotomy and bilateral salpingo-oophorectomy was performed in addition to pelvic washings.  Frozen section revealed a benign mucinous cystadenofibroma.  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  Felt to be benign.  She had a right tube and ovary removed.  Both the left fallopian tube and right fallopian tube were benign and the right ovary was benign.  She returns today for a final postoperative check She is overall doing well. Some gas pain after eating, but normal BM. She denies fever denies nausea denies vomiting.    Breast mass was found to be malignant and she has seen the surgeon with plans for surgery in the near future.   Oncologic History: NA    Malignant neoplasm of upper-inner quadrant of right breast in female, estrogen receptor positive ([name])   03/14/2018 Initial Diagnosis    Right breast mass noted on a CT scan, on mammogram 2 separate masses were noted.  2.2 cm mass at 11 o'clock position: IDC grade 2 with DCIS, ER 95%, PR 70%, Ki-67 10%,HER-2 negative ratio 1.3; second mass 1.3 cm at 1 o'clock position: IDC grade 2, ER 100%, PR 70%, Ki-67 10%, HER-2 negative ratio 1.6, T2N0 stage Ib clinical stage AJCC 8       Current Meds:  Outpatient Encounter Medications as of 03/24/2018  Medication Sig  . buPROPion (WELLBUTRIN XL) 300 MG 24 hr tablet Take 300 mg by mouth daily.  Marland Kitchen docusate sodium (COLACE) 100 MG capsule Take 100 mg by mouth as needed for mild constipation.  Marland Kitchen FLUoxetine (PROZAC) 20 MG capsule Take 20 mg by mouth daily.  Marland Kitchen HYDROcodone-acetaminophen (NORCO/VICODIN) 5-325 MG tablet Take 1 tablet by mouth every 4 (four) hours as needed  for moderate pain.  Marland Kitchen lisinopril-hydrochlorothiazide (PRINZIDE,ZESTORETIC) 20-12.5 MG tablet Take 1 tablet by mouth daily.  Marland Kitchen LORazepam (ATIVAN) 0.5 MG tablet Take 0.5 mg by mouth daily as needed for anxiety.  Marland Kitchen OVER THE COUNTER MEDICATION Take 2 each by mouth at bedtime. OTC [employer]arma GABA sleep med at bedtime for sleep per pt.    No facility-administered encounter medications on file as of 03/24/2018.     Allergy:  Allergies  Allergen Reactions  . Hydroxyzine Anaphylaxis    Tongue swollen  . Shrimp [Shellfish Allergy] Other (See Comments)    Food poisoning     Social Hx:   Social History   Socioeconomic History  . Marital status: Married    Spouse name: Not on file  . Number of children: Not on file  . Years of education: Not on file  . Highest education level: Not on file  Occupational History  . Not on file  Social Needs  . Financial resource strain: Not on file  . Food insecurity:    Worry: Not on file    Inability: Not on file  . Transportation needs:    Medical: Not on file    Non-medical: Not on file  Tobacco Use  . Smoking status: Never Smoker  . Smokeless tobacco: Never Used  Substance and Sexual Activity  . Alcohol use: Not Currently    Frequency: Never    Comment: alcohol dependence prior to  2007  . Drug use: Never  . Sexual activity: Yes  Lifestyle  . Physical activity:    Days per week: Not on file    Minutes per session: Not on file  . Stress: Not on file  Relationships  . Social connections:    Talks on phone: Not on file    Gets together: Not on file    Attends religious service: Not on file    Active member of club or organization: Not on file    Attends meetings of clubs or organizations: Not on file    Relationship status: Not on file  . Intimate partner violence:    Fear of current or ex partner: Not on file    Emotionally abused: Not on file    Physically abused: Not on file    Forced sexual activity: Not on file  Other Topics Concern   . Not on file  Social History Narrative  . Not on file    Past Surgical Hx:  Past Surgical History:  Procedure Laterality Date  . LAPAROTOMY N/A 02/10/2018   Procedure: EXPLORATORY LAPAROTOMY;  Surgeon: Kendra Caprice, MD;  Location: [facility];  Service: Gynecology;  Laterality: N/A;  . OVARIAN CYST REMOVAL    . SALPINGOOPHORECTOMY Bilateral 02/10/2018   Procedure: BILATERAL SALPINGO OOPHORECTOMY; PERITONEAL WASHINGS;  Surgeon: Kendra Caprice, MD;  Location: [facility];  Service: Gynecology;  Laterality: Bilateral;  . TONSILLECTOMY    . TONSILLECTOMY AND ADENOIDECTOMY  1953    Past Medical Hx:  Past Medical History:  Diagnosis Date  . Complication of anesthesia   . Depression   . Family history of breast cancer 03/24/2018  . Family history of lung cancer 03/24/2018  . History of alcohol dependence ([place]) 2007   no ETOH; resolved since 2007  . History of psychosis 2014   due to sleep disturbance  . Hypertension   . PONV (postoperative nausea and vomiting)     Past Gynecological History:   GYNECOLOGIC HISTORY:  No LMP recorded. Patient is postmenopausal. Menarche: 72 years old P 0 LMP 72yo HRT none  Claims last normal Pap 2016 and got a phone call. States no abnormal pap smears.  Family Hx:  Family History  Problem Relation Age of Onset  . Diabetes Mother   . Breast cancer Mother 4  . Hypertension Mother   . Lung cancer Father        asbestos related  . Heart disease Brother   . Breast cancer Cousin 33       paternal cousin    Review of Systems:  Review of Systems  HENT:   Positive for tinnitus.   Endocrine: Positive for hot flashes.  All other systems reviewed and are negative. gas pain with eating.    Vitals:  Blood pressure 124/64, pulse 77, temperature 98.5 F (36.9 C), temperature source Oral, resp. rate 17, height '5\' 6"'$  (1.676 m), weight 166 lb 12.8 oz (75.7 kg), SpO2 100 %. Body mass index is 26.92 kg/m.   Physical Exam:  General :  Well  developed, 72 y.o., female in no apparent distress HEENT:  Normocephalic/atraumatic, symmetric, EOMI, eyelids normal Neck:   No visible masses.  Respiratory:  Respirations unlabored, no use of accessory muscles CV:   Deferred Breast:  Deferred Musculoskeletal: Normal muscle strength. Abdomen:  Wound is healed with no defects. No visible masses or protrusion Extremities:  No visible edema or deformities Skin:   Normal inspection Neuro/Psych:  No focal motor deficit, no abnormal  mental status. Normal gait. Normal affect. Alert and oriented to person, place, and time  Pelvic:  Deferred    Assessment/Plan: 1. Followup with surgery as scheduled for the breast CA 2. Pelvic abdominal mass o Thankfully the ovarian cyst was benign  3. She is discharged from my care back to Dr. Benjie Karvonen for routine well-woman visits. 4. She does have her uterus still and I counseled her postmenopausal bleeding would require evaluation. I suspect she'll be placed on some type of anti-estrogen therapy for her breast cancer. Tamoxifen, if given may increase her risk for uterine cancer and we briefly touched on that today. She is always welcome back should she need [place].  Kendra Caprice, MD  03/24/2018, 11:21 AM  Cc: Dr. Evert Kohl. Benjie Karvonen, Referring Ob/Gyn

## 2018-03-24 NOTE — Progress Notes (Signed)
REFERRING PROVIDER: Nicholas Lose, MD [place], [place] [address]  PRIMARY PROVIDER:  [name], MD  PRIMARY REASON FOR VISIT:  1. Malignant neoplasm of upper-inner quadrant of right breast in female, estrogen receptor positive ([place])   2. Malignant neoplasm of upper-outer quadrant of right breast in female, estrogen receptor positive ([place])   3. Family history of breast cancer   4. Family history of lung cancer     HISTORY OF PRESENT ILLNESS:   Kendra [name], a 72 y.o. female, was seen for a [place] cancer genetics consultation at the request of Dr. Lindi Adie due to a personal and family history of cancer.  Kendra [name] presents to clinic today to discuss the possibility of a hereditary predisposition to cancer, genetic testing, and to further clarify her future cancer risks, as well as potential cancer risks for family members.   On 03/14/2018 at the age of 81, Ms. Spano was diagnosed with 2 masses of Invasive ductal carcinoma of the right breast.  These are believed to be 2 separate primary cancers.   She is currently considering 2 lumpectomies versus right mastectomy followed by antiestrogen therapy and possible adjuvant radiation.  Both lesions are ER/PR+, HER2 -.   CANCER HISTORY:    Malignant neoplasm of upper-inner quadrant of right breast in female, estrogen receptor positive ([place])   03/14/2018 Initial Diagnosis    Right breast mass noted on a CT scan, on mammogram 2 separate masses were noted.  2.2 cm mass at 11 o'clock position: IDC grade 2 with DCIS, ER 95%, PR 70%, Ki-67 10%,HER-2 negative ratio 1.3; second mass 1.3 cm at 1 o'clock position: IDC grade 2, ER 100%, PR 70%, Ki-67 10%, HER-2 negative ratio 1.6, T2N0 stage Ib clinical stage AJCC 8       HORMONAL RISK FACTORS:  First live birth at age N/A.  Ovaries intact: no.  Hysterectomy: no.  Menopausal status: postmenopausal.  Colonoscopy: no; reports doing cologuard yearly- normal. Mammogram  within the last year: yes.  Past Medical History:  Diagnosis Date  . Complication of anesthesia   . Depression   . Family history of breast cancer 03/24/2018  . Family history of lung cancer 03/24/2018  . History of alcohol dependence ([place]) 2007   no ETOH; resolved since 2007  . History of psychosis 2014   due to sleep disturbance  . Hypertension   . PONV (postoperative nausea and vomiting)     Past Surgical History:  Procedure Laterality Date  . LAPAROTOMY N/A 02/10/2018   Procedure: EXPLORATORY LAPAROTOMY;  Surgeon: Isabel Caprice, MD;  Location: [facility];  Service: Gynecology;  Laterality: N/A;  . OVARIAN CYST REMOVAL    . SALPINGOOPHORECTOMY Bilateral 02/10/2018   Procedure: BILATERAL SALPINGO OOPHORECTOMY; PERITONEAL WASHINGS;  Surgeon: Isabel Caprice, MD;  Location: [facility];  Service: Gynecology;  Laterality: Bilateral;  . TONSILLECTOMY    . TONSILLECTOMY AND ADENOIDECTOMY  1953    Social History   Socioeconomic History  . Marital status: Married    Spouse name: Not on file  . Number of children: Not on file  . Years of education: Not on file  . Highest education level: Not on file  Occupational History  . Not on file  Social Needs  . Financial resource strain: Not on file  . Food insecurity:    Worry: Not on file    Inability: Not on file  . Transportation needs:    Medical: Not on file    Non-medical: Not  on file  Tobacco Use  . Smoking status: Never Smoker  . Smokeless tobacco: Never Used  Substance and Sexual Activity  . Alcohol use: Not Currently    Frequency: Never    Comment: alcohol dependence prior to 2007  . Drug use: Never  . Sexual activity: Yes  Lifestyle  . Physical activity:    Days per week: Not on file    Minutes per session: Not on file  . Stress: Not on file  Relationships  . Social connections:    Talks on phone: Not on file    Gets together: Not on file    Attends religious service: Not on file    Active member of club or  organization: Not on file    Attends meetings of clubs or organizations: Not on file    Relationship status: Not on file  Other Topics Concern  . Not on file  Social History Narrative  . Not on file     FAMILY HISTORY:  We obtained a detailed, 4-generation family history.  Significant diagnoses are listed below:  Family History  Problem Relation Age of Onset  . Diabetes Mother   . Breast cancer Mother 80  . Hypertension Mother   . Lung cancer Father        asbestos exposure  . Heart disease Brother   . Breast cancer Cousin 58       paternal cousin  . Heart disease Paternal Grandmother 75  . Heart disease Paternal Grandfather    Ms. Reichl has no children.  Ms. Huguley had 1 brother who died at 61 due to heart disease. He had 1 son and 1 daughter who is her 42's.   Ms. Leathers father: had lung cancer, asbestos exposure Paternal aunts/Uncles: 3 paternal aunts, 2 are deceased in their 64's, and 1 is alive at 26.   Paternal cousins: 1 paternal cousin had breast cancer at 39.  No other cousins with any history of cancer.  Paternal grandfather: died due to heart disease.  Paternal grandmother:died in her 22's due to heart disease.   Ms. Glauser mother: diagnosed with breast cancer at 41.  Maternal Aunts/Uncles: 5 maternal aunts, 2 maternal uncles- unk health history and limited info about these realtives.  Maternal cousins: no known history of cancer.  Maternal grandfather: died in his 5's, unk cause Maternal grandmother:died older than 52, unk cause.  Ms. Bushard is unaware of previous family history of genetic testing for hereditary cancer risks. Patient's maternal ancestors are of Caucasian descent, and paternal ancestors are of Caucasian descent. There is no reported Ashkenazi Jewish ancestry. There is no known consanguinity.  GENETIC COUNSELING ASSESSMENT: Brion Hedges is a 72 y.o. female with a personal and family history which is somewhat suggestive of a  Hereditary Cancer Predisposition Syndrome. We, therefore, discussed and recommended the following at today's visit.   DISCUSSION: We reviewed the characteristics, features and inheritance patterns of hereditary cancer syndromes. We also discussed genetic testing, including the appropriate family members to test, the process of testing, insurance coverage and turn-around-time for results. We discussed the implications of a negative, positive and/or variant of uncertain significant result. In order to get genetic test results in a timely manner so that Ms. Gillham can use these genetic test results for surgical decisions, we recommended Ms. Jonsson pursue genetic testing for the Breast Cancer STAT Panel. We then recommend Ms. Hun pursue reflex genetic testing to the Multi-Cancer gene panel. The Multi-Cancer Panel offered by [facility] includes sequencing and/or  deletion duplication testing of the following 83 genes: ALK, APC, ATM, AXIN2,BAP1,  BARD1, BLM, BMPR1A, BRCA1, BRCA2, BRIP1, CASR, CDC73, CDH1, CDK4, CDKN1B, CDKN1C, CDKN2A (p14ARF), CDKN2A (p16INK4a), CEBPA, CHEK2, CTNNA1, DICER1, DIS3L2, EGFR (c.2369C>T, p.Thr790Met variant only), EPCAM (Deletion/duplication testing only), FH, FLCN, GATA2, GPC3, GREM1 (Promoter region deletion/duplication testing only), HOXB13 (c.251G>A, p.Gly84Glu), HRAS, KIT, MAX, MEN1, MET, MITF (c.952G>A, p.Glu318Lys variant only), MLH1, MSH2, MSH3, MSH6, MUTYH, NBN, NF1, NF2, NTHL1, PALB2, PDGFRA, PHOX2B, PMS2, POLD1, POLE, POT1, PRKAR1A, PTCH1, PTEN, RAD50, RAD51C, RAD51D, RB1, RECQL4, RET, RUNX1, SDHAF2, SDHA (sequence changes only), SDHB, SDHC, SDHD, SMAD4, SMARCA4, SMARCB1, SMARCE1, STK11, SUFU, TERC, TERT, TMEM127, TP53, TSC1, TSC2, VHL, WRN and WT1.   We discussed that only 5-10% of cancers are associated with a Hereditary cancer predisposition syndrome.  One of the most common hereditary cancer syndromes that increases breast cancer risk is called Hereditary Breast and  Ovarian Cancer (HBOC) syndrome.  This syndrome is caused by mutations in the BRCA1 and BRCA2 genes.  This syndrome increases an individual's lifetime risk to develop breast, ovarian, pancreatic, and other types of cancer.  There are also many other cancer predisposition syndromes caused by mutations in several other genes.  We discussed that if she is found to have a mutation in one of these genes, it may impact surgical decisions, and alter future medical management recommendations such as increased cancer screenings and consideration of risk reducing surgeries.  A positive result could also have implications for the patient's family members.  A Negative result would mean we were unable to identify a hereditary component to her cancer, but does not rule out the possibility of a hereditary basis for her cancer.  There could be mutations that are undetectable by current technology, or in genes not yet tested or identified to increase cancer risk.    We discussed the potential to find a Variant of Uncertain Significance or VUS.  These are variants that have not yet been identified as pathogenic or benign, and it is unknown if this variant is associated with increased cancer risk or if this is a normal finding.  Most VUS's are reclassified to benign or likely benign.   It should not be used to make medical management decisions. With time, we suspect the lab will determine the significance of any VUS's identified if any.   Based on Ms. Howdyshell's personal and family history of cancer, she meets medical criteria for genetic testing. Despite that she meets criteria, she may still have an out of pocket cost.   PLAN: After considering the risks, benefits, and limitations, Ms. Straley  provided informed consent to pursue genetic testing and the blood sample was sent to [facility] for analysis of the Breast Cancer STAT panel with plans to reflex to the Multi-cancer panel. Preliminary results should be  available within approximately 5/10 days' time, at which point they will be disclosed by telephone to Ms. Gotcher, as will any additional recommendations warranted by these results. Ms. Packham will receive a summary of her genetic counseling visit and a copy of her results once available. This information will also be available in Epic. We encouraged Ms. Cheaney to remain in contact with cancer genetics annually so that we can continuously update the family history and inform her of any changes in cancer genetics and testing that may be of benefit for her family. Ms. Aaron questions were answered to her satisfaction today. Our contact information was provided should additional questions or concerns arise.  Lastly, we encouraged Ms.  Smaldone to remain in contact with cancer genetics annually so that we can continuously update the family history and inform her of any changes in cancer genetics and testing that may be of benefit for this family.   Ms.  Laiche questions were answered to her satisfaction today. Our contact information was provided should additional questions or concerns arise. Thank you for the referral and allowing [place] to share in the care of your patient.   Tana Felts, MS, [facility] Certified Genetic Counselor [email or web address] phone: [phone]  The patient was seen for a total of 35 minutes in face-to-face genetic counseling.  The patient was accompanied today by her husband. This patient was discussed with Drs. Magrinat, Lindi Adie and/or Burr Medico who agrees with the above.

## 2018-03-24 NOTE — Patient Instructions (Signed)
Continue with restrictions for the next two weeks then slowly increase your activity.  No follow up needed at this time with Dr. Gerarda Fraction.  Continue with follow up scheduled to evaluate your breast.  Please call for any needs.

## 2018-03-24 NOTE — Progress Notes (Signed)
[place] Psychosocial Distress Screening Spiritual Care  Met with Kendra [name] and [name] in [facility] yesterday to introduce [facility] team/resources, reviewing distress screen per protocol.  The patient scored a 5 on the Psychosocial Distress Thermometer which indicates moderate distress. Also assessed for distress and other psychosocial needs.   ONCBCN DISTRESS SCREENING 03/24/2018  Screening Type Initial Screening  Distress experienced in past week (1-10) 5  Emotional problem type Depression;Nervousness/Anxiety;Adjusting to illness  Spiritual/Religous concerns type Facing my mortality  Information Concerns Type Lack of info about diagnosis;Lack of info about treatment;Lack of info about complementary therapy choices;Lack of info about maintaining fitness  Referral to support programs Yes    Per couple, they were relieved to have some resolution of the unknown, which was their biggest source of anxiety prior to attending [facility]. They report appreciation for the [facility] process and confidence in their team. Contemplative practices, including reflective reading together morning and night, help them stay focused on meaning and QOL. Per couple, they have AD in place and value QOL over quantity. Particularly in response to [name]'s recent fibroid removal, they have discussed goals and wishes in detail.  We followed up again briefly today in [employer].  Follow up needed: No. Couple welcomed Spiritual Care and is aware of ongoing Arnold availability here at [facility], even as they plan to pursue most treatment at [facility]. They plan to phone as desired, but please also page if immediate needs arise or circumstances change. Thank you.   [place], [place], [facility] Pager [phone] Voicemail [phone]

## 2018-03-29 ENCOUNTER — Telehealth: Payer: Self-pay | Admitting: *Deleted

## 2018-03-29 NOTE — Telephone Encounter (Signed)
Left vm regarding [facility] from 5.22.19. Contact information provided for questions or needs.

## 2018-03-31 ENCOUNTER — Encounter: Payer: Self-pay | Admitting: General Practice

## 2018-03-31 NOTE — Progress Notes (Signed)
[place] Spiritual Care Note  Returned Kendra [name]'s call re contact for support programs available at [facility], providing emotional support and encouragement as well.   [place], [place], [facility] Pager [phone] Voicemail [phone]

## 2018-04-01 ENCOUNTER — Encounter: Payer: Self-pay | Admitting: *Deleted

## 2018-04-01 ENCOUNTER — Telehealth: Payer: Self-pay

## 2018-04-01 NOTE — Telephone Encounter (Signed)
Kendra [name] is stating that in the last week she has began with some intermittent sharp pains on the left side of her abdominal incision.  She can walk off the pain .  She wants to be sure that this is normal. Afebrile No redness, swelling, or drainage noted. Reviewed with Joylene John, NP. Told Kendra [name] that this pain is normal with the healing process. She could purchase a light weight support undergarment to help support her abdominal muscles for the nest few weeks. Pt appreciated the recommendation.

## 2018-04-04 ENCOUNTER — Other Ambulatory Visit: Payer: Self-pay | Admitting: General Surgery

## 2018-04-04 DIAGNOSIS — C50911 Malignant neoplasm of unspecified site of right female breast: Secondary | ICD-10-CM

## 2018-04-05 ENCOUNTER — Telehealth: Payer: Self-pay | Admitting: Hematology and Oncology

## 2018-04-05 NOTE — Telephone Encounter (Signed)
Mailed patient calendar of upcoming July appointments per 6/3 sch message

## 2018-04-06 ENCOUNTER — Telehealth: Payer: Self-pay | Admitting: Genetics

## 2018-04-06 NOTE — Telephone Encounter (Signed)
Revealed negative genetic testing.  Revealed that a VUS in MSH6 was identified.   This normal result is reassuring and indicates that it is unlikely Ms. Barris's cancer is due to a hereditary cause.  It is unlikely that there is an increased risk of another cancer due to a mutation in one of these genes.  However, genetic testing is not perfect, and cannot definitively rule out a hereditary cause.  It will be important for her to keep in contact with genetics to learn if any additional testing may be needed in the future.     No indication from a genetics perspective to change surgical plan, but I referred her to her surgeon to answer her follow-up questions about surgery plan for treatment of her breast cancer.

## 2018-04-07 ENCOUNTER — Ambulatory Visit: Payer: Self-pay | Admitting: Genetics

## 2018-04-07 ENCOUNTER — Encounter: Payer: Self-pay | Admitting: Genetics

## 2018-04-07 DIAGNOSIS — Z1379 Encounter for other screening for genetic and chromosomal anomalies: Secondary | ICD-10-CM

## 2018-04-07 DIAGNOSIS — C50211 Malignant neoplasm of upper-inner quadrant of right female breast: Secondary | ICD-10-CM

## 2018-04-07 DIAGNOSIS — Z170 Estrogen receptor positive status [ER+]: Principal | ICD-10-CM

## 2018-04-07 DIAGNOSIS — C50411 Malignant neoplasm of upper-outer quadrant of right female breast: Secondary | ICD-10-CM

## 2018-04-07 DIAGNOSIS — Z803 Family history of malignant neoplasm of breast: Secondary | ICD-10-CM

## 2018-04-07 DIAGNOSIS — Z801 Family history of malignant neoplasm of trachea, bronchus and lung: Secondary | ICD-10-CM

## 2018-04-07 NOTE — Progress Notes (Signed)
HPI:  Kendra [name] was previously seen in the [place] clinic on 03/24/2018 due to a personal and family history of breast cancer and concerns regarding a hereditary predisposition to cancer. Please refer to our prior cancer genetics clinic note for more information regarding Kendra [name]'s medical, social and family histories, and our assessment and recommendations, at the time. Kendra [name] recent genetic test results were disclosed to her, as well as recommendations warranted by these results. These results and recommendations are discussed in more detail below.  CANCER HISTORY:    Malignant neoplasm of upper-inner quadrant of right breast in female, estrogen receptor positive ([place])   03/14/2018 Initial Diagnosis    Right breast mass noted on a CT scan, on mammogram 2 separate masses were noted.  2.2 cm mass at 11 o'clock position: IDC grade 2 with DCIS, ER 95%, PR 70%, Ki-67 10%,HER-2 negative ratio 1.3; second mass 1.3 cm at 1 o'clock position: IDC grade 2, ER 100%, PR 70%, Ki-67 10%, HER-2 negative ratio 1.6, T2N0 stage Ib clinical stage AJCC 8        FAMILY HISTORY:  We obtained a detailed, 4-generation family history.  Significant diagnoses are listed below: Family History  Problem Relation Age of Onset  . Diabetes Mother   . Breast cancer Mother 15  . Hypertension Mother   . Lung cancer Father        asbestos exposure  . Heart disease Brother 69  . Breast cancer Cousin 61       paternal cousin  . Heart disease Paternal Grandmother 86  . Heart disease Paternal Grandfather     Kendra [name] has no children.  Kendra [name] had 1 brother who died at 22 due to heart disease. He had 1 son and 1 daughter who is her 46's.   Kendra [name] father: had lung cancer, asbestos exposure Paternal aunts/Uncles: 3 paternal aunts, 2 are deceased in their 36's, and 1 is alive at 43.   Paternal cousins: 1 paternal cousin had breast cancer at 26.  No other cousins with any  history of cancer.  Paternal grandfather: died due to heart disease.  Paternal grandmother:died in her 1's due to heart disease.   Kendra [name] mother: diagnosed with breast cancer at 56.  Maternal Aunts/Uncles: 5 maternal aunts, 2 maternal uncles- unk health history and limited info about these realtives.  Maternal cousins: no known history of cancer.  Maternal grandfather: died in his 16's, unk cause Maternal grandmother:died older than 71, unk cause.  Kendra [name] is unaware of previous family history of genetic testing for hereditary cancer risks. Patient's maternal ancestors are of Caucasian descent, and paternal ancestors are of Caucasian descent. There is no reported Ashkenazi Jewish ancestry. There is no known consanguinity.  GENETIC TEST RESULTS: Genetic testing performed through [facility]'s Multi-Cancer Panel reported out on 04/05/2018 showed no pathogenic mutations. The Multi-Cancer Panel offered by [facility] includes sequencing and/or deletion duplication testing of the following 83 genes: ALK, APC, ATM, AXIN2,BAP1,  BARD1, BLM, BMPR1A, BRCA1, BRCA2, BRIP1, CASR, CDC73, CDH1, CDK4, CDKN1B, CDKN1C, CDKN2A (p14ARF), CDKN2A (p16INK4a), CEBPA, CHEK2, CTNNA1, DICER1, DIS3L2, EGFR (c.2369C>T, p.Thr790Met variant only), EPCAM (Deletion/duplication testing only), FH, FLCN, GATA2, GPC3, GREM1 (Promoter region deletion/duplication testing only), HOXB13 (c.251G>A, p.Gly84Glu), HRAS, KIT, MAX, MEN1, MET, MITF (c.952G>A, p.Glu318Lys variant only), MLH1, MSH2, MSH3, MSH6, MUTYH, NBN, NF1, NF2, NTHL1, PALB2, PDGFRA, PHOX2B, PMS2, POLD1, POLE, POT1, PRKAR1A, PTCH1, PTEN, RAD50, RAD51C, RAD51D, RB1, RECQL4, RET, RUNX1, SDHAF2, SDHA (sequence changes only), SDHB, SDHC,  SDHD, SMAD4, SMARCA4, SMARCB1, SMARCE1, STK11, SUFU, TERC, TERT, TMEM127, TP53, TSC1, TSC2, VHL, WRN and WT1. .  A variant of uncertain significance (VUS) in a gene called MSH6 was also noted. c.3450A>C (p.Leu1150Phe)  The test report will be  scanned into EPIC and will be located under the Molecular Pathology section of the Results Review tab. A portion of the result report is included below for reference.      We discussed with Kendra [name] that because current genetic testing is not perfect, it is possible there may be a gene mutation in one of these genes that current testing cannot detect, but that chance is small.  We also discussed, that there could be another gene that has not yet been discovered, or that we have not yet tested, that is responsible for the cancer diagnoses in the family. It is also possible there is a hereditary cause for the cancer in the family that Kendra [name] did not inherit and therefore was not identified in her testing.  Therefore, it is important to remain in touch with cancer genetics in the future so that we can continue to offer Kendra [name] the most up to date genetic testing.   Regarding the VUS in MSH6: At this time, it is unknown if this variant is associated with increased cancer risk or if this is a normal finding, but most variants such as this get reclassified to being inconsequential. It should not be used to make medical management decisions. With time, we suspect the lab will determine the significance of this variant, if any. If we do learn more about it, we will try to contact Kendra [name] to discuss it further. However, it is important to stay in touch with [place] periodically and keep the address and phone number up to date.  ADDITIONAL GENETIC TESTING: We discussed with Kendra [name] that her genetic testing was fairly extensive.  If there are are genes identified to increase cancer risk that can be analyzed in the future, we would be happy to discuss and coordinate this testing at that time.    CANCER SCREENING RECOMMENDATIONS: This result indicates that it is unlikely Kendra [name] has an increased risk for a future cancer due to a mutation in one of these genes. This normal test also suggests  that Kendra [name] cancer was most likely not due to an inherited predisposition associated with one of these genes.  Most cancers happen by chance and this negative test suggests that her cancer may fall into this category.  Therefore, it is recommended she continue to follow the cancer management and screening guidelines provided by her oncology and primary healthcare provider. Other factors such as her personal and family history may still affect her cancer risk.  RECOMMENDATIONS FOR FAMILY MEMBERS:  Relatives in this family might be at some increased risk of developing cancer, over the general population risk, simply due to the family history of cancer.  We recommended women in this family have a yearly mammogram beginning at age 61, or 16 years younger than the earliest onset of cancer, an annual clinical breast exam, and perform monthly breast self-exams. Women in this family should also have a gynecological exam as recommended by their primary provider. All family members should have a colonoscopy by age 53 (or as directed by their doctors).  All family members should inform their physicians about the family history of cancer so their doctors can make the most appropriate screening recommendations for them.   It is also  possible there is a hereditary cause for the cancer in Ms. Devall's family that she did not inherit and therefore was not identified in her.     FOLLOW-UP: Lastly, we discussed with Ms. Baldonado that cancer genetics is a rapidly advancing field and it is possible that new genetic tests will be appropriate for her and/or her family members in the future. We encouraged her to remain in contact with cancer genetics on an annual basis so we can update her personal and family histories and let her know of advances in cancer genetics that may benefit this family.   Our contact number was provided. Ms. Stieber questions were answered to her satisfaction, and she knows she is welcome to  call us at anytime with additional questions or concerns.   Ferol Luz, MS, [facility] Certified Genetic Counselor [email or web address]

## 2018-04-20 ENCOUNTER — Other Ambulatory Visit: Payer: Self-pay

## 2018-04-20 ENCOUNTER — Encounter (HOSPITAL_BASED_OUTPATIENT_CLINIC_OR_DEPARTMENT_OTHER): Payer: Self-pay | Admitting: *Deleted

## 2018-04-26 ENCOUNTER — Ambulatory Visit
Admission: RE | Admit: 2018-04-26 | Discharge: 2018-04-26 | Disposition: A | Discharge status: Home / Self Care | Payer: Medicare Other | Source: Ambulatory Visit | Attending: General Surgery | Admitting: General Surgery

## 2018-04-26 ENCOUNTER — Encounter (HOSPITAL_BASED_OUTPATIENT_CLINIC_OR_DEPARTMENT_OTHER): Payer: Self-pay | Admitting: Certified Registered"

## 2018-04-26 DIAGNOSIS — C50911 Malignant neoplasm of unspecified site of right female breast: Secondary | ICD-10-CM

## 2018-04-26 NOTE — Progress Notes (Signed)
Ensure pre surgery drink given with instructions to complete by 0430 dos, surgical soap given with instructions, pt verbalized understanding. 

## 2018-04-26 NOTE — Anesthesia Preprocedure Evaluation (Addendum)
Anesthesia Evaluation  Patient identified by MRN, date of birth, ID band Patient awake    Reviewed: Allergy & Precautions, NPO status , Patient's Chart, lab work & pertinent test results  History of Anesthesia Complications (+) PONV  Airway Mallampati: II       Dental no notable dental hx. (+) Caps, Teeth Intact   Pulmonary neg pulmonary ROS,    Pulmonary exam normal        Cardiovascular Exercise Tolerance: Good hypertension, Pt. on medications Normal cardiovascular exam Rhythm:Regular Rate:Normal     Neuro/Psych PSYCHIATRIC DISORDERS Anxiety Depression negative neurological ROS     GI/Hepatic negative GI ROS,   Endo/Other  negative endocrine ROS  Renal/GU negative Renal ROS     Musculoskeletal   Abdominal   Peds  Hematology negative hematology ROS (+)   Anesthesia Other Findings   Reproductive/Obstetrics                             Lab Results  Component Value Date   WBC 7.5 03/23/2018   HGB 13.0 03/23/2018   HCT 39.3 03/23/2018   MCV 84.6 03/23/2018   PLT 234 03/23/2018    Anesthesia Physical Anesthesia Plan  ASA: II  Anesthesia Plan: General   Post-op Pain Management:  Regional for Post-op pain   Induction: Intravenous  PONV Risk Score and Plan: 4 or greater and Treatment may vary due to age or medical condition, Ondansetron, Dexamethasone and Scopolamine patch - Pre-op  Airway Management Planned: LMA  Additional Equipment:   Intra-op Plan:   Post-operative Plan:   Informed Consent: I have reviewed the patients History and Physical, chart, labs and discussed the procedure including the risks, benefits and alternatives for the proposed anesthesia with the patient or authorized representative who has indicated his/her understanding and acceptance.     Plan Discussed with: CRNA  Anesthesia Plan Comments:       Anesthesia Quick Evaluation

## 2018-04-26 NOTE — H&P (Signed)
History of Present Illness Kendra [name] Sangiovanni MD; 04/12/2018 5:11 PM) The patient is a 72 year old female who presents with breast cancer. Patient returns to the office for further discussion regarding her newly diagnosed multicentric cancer of the right breast. Her original presentation was as follows:  She is a post menopausal female referred by Dr. Jetta Lout for evaluation of recently diagnosed carcinoma of the right breast. approaching 3 months ago the patient had a CT scan of the abdomen and pelvis to evaluate a large ovarian mass which subsequently has been removed and was benign. This incidentally showed 2 distinct masses in the right breast. Subsequent imaging included diagnostic mamogram showing a 3.1 spiculated mass in the upper outer quadrant and a 1.4 cm mass in the upper inner quadrant of the right breast as well as a 7 mm bilobed mass in the lateral left breast. Ultrasound showed an irregular hypoechoic mass at the 11:00 position partially cystic totaling 2.9 cm and a hypoechoic mass at the 1:00 position measuring 1.3 cm. The bilobed mass appeared to be a cyst. An ultrasound guided breast biopsy was performed on 03/14/2018 with pathology revealing invasive ductal carcinoma of the breast in both masses. Biopsy marking clips ultimately measured about 6 cm apart. She is seen now in breast multidisciplinary clinic for initial treatment planning. She has experienced no breast symptoms initially such as mass or skin changes or nipple discharge although can feel a lump in the upper outer quadrant since biopsy. She does not have a personal history of any previous breast problems.  Findings at that time were the following: Tumor size: 2.9 and 1.3 cm Tumor grade: 2 Estrogen Receptor: positive Progesterone Receptor: positive Her-2 neu: negative Lymph node status: negative  Studies as above for both tumors although there were some subtle differences histologically and slight differences in  percentage of hormone positivity. After original consultation she desired breast conservation with double lumpectomy and axillary sentinel lymph node biopsy. Since then she has had genetic testing which showed no cancer genes although a VUS was noted.    Problem List/Past Medical Kendra Kitchen T. Alyzah Pelly, MD; 04/12/2018 5:12 PM) MALIGNANT NEOPLASM OF RIGHT BREAST, STAGE 1, ESTROGEN RECEPTOR POSITIVE (C50.911)   Past Surgical History Kendra Kitchen T. Rondle Lohse, MD; 04/12/2018 5:12 PM) Breast Biopsy  Right. Oral Surgery  Tonsillectomy   Diagnostic Studies History Kendra Kitchen T. Garey Alleva, MD; 04/12/2018 5:12 PM) Colonoscopy  never Mammogram  within last year Pap Smear  1-5 years ago  Allergies Alean Rinne, RMA; 04/12/2018 3:41 PM) HydrOXYzine HCl *ANTIANXIETY AGENTS*  Anaphylaxis. SHRIMP  Food Poisoning Allergies Reconciled   Medication History Alean Rinne, [place]; 04/12/2018 3:42 PM) Wellbutrin XL ('300MG'$  Tablet ER 24HR, Oral) Active. PROzac ('20MG'$  Capsule, Oral) Active. Norco (5-'325MG'$  Tablet, Oral) Active. Prinzide (20-12.'5MG'$  Tablet, Oral) Active. Ativan (0.'5MG'$  Tablet, Oral) Active. Lisinopril-Hydrochlorothiazide (10-12.'5MG'$  Tablet, Oral) Active. Medications Reconciled  Social History Kendra Kitchen T. Lareta Bruneau, MD; 04/12/2018 5:12 PM) Alcohol use  Occasional alcohol use. Caffeine use  Coffee, Tea. No drug use  Tobacco use  Never smoker.  Family History Kendra Kitchen T. Bonner Larue, MD; 04/12/2018 5:12 PM) Alcohol Abuse  Father. Arthritis  Family Members In General, Mother. Breast Cancer  Family Members In General, Mother. Diabetes Mellitus  Mother. Heart Disease  Brother. Hypertension  Mother. Respiratory Condition  Father.  Pregnancy / Birth History Kendra Kitchen T. Arianah Torgeson, MD; 04/12/2018 5:12 PM) Age at menarche  82 years. Age of menopause  45-60 Contraceptive History  Oral contraceptives. Irregular periods   Other Problems Kendra Kitchen T. Nazia Rhines, MD; 04/12/2018  5:12 PM) Anxiety Disorder  Back Pain  Breast Cancer  Cholelithiasis  Gastroesophageal Reflux Disease  General anesthesia - complications  Hemorrhoids  Hepatitis  High blood pressure  Lump In Breast  Transfusion history   Vitals Alean Rinne RMA; 04/12/2018 3:41 PM) 04/12/2018 3:40 PM Weight: 167.8 lb Height: 66in Body Surface Area: 1.86 m Body Mass Index: 27.08 kg/m  Temp.: 98.91F  Pulse: 91 (Regular)  BP: 138/85 (Sitting, Left Arm, Standard)       Physical Exam Kendra Kitchen T. Deunte Bledsoe MD; 04/12/2018 5:12 PM) The physical exam findings are as follows: Note:General: Alert, well-developed and well nourished Caucasian female, in no distress Skin: Warm and dry without rash or infection. HEENT: No palpable masses or thyromegaly. Sclera nonicteric. Lymph nodes: No cervical, supraclavicular, nodes palpable. Breasts:there is an approximately 2 cm freely movable mass in the upper-outer right breast. No other palpable abnormalities in either breast. No skin changes or nipple crusting or inversion. Lungs: Breath sounds clear and equal. No wheezing or increased work of breathing. Cardiovascular: Regular rate and rhythm without murmer. No JVD or edema. Peripheral pulses intact. Abdomen: Nondistended. healing midline incision without complicating factors Soft and nontender. No masses palpable. No organomegaly. No palpable hernias. Extremities: No edema or joint swelling or deformity. No chronic venous stasis changes. Neurologic: Alert and fully oriented. Gait normal. No focal weakness. Psychiatric: Normal mood and affect. Thought content appropriate with normal judgement and insight    Assessment & Plan Kendra Kitchen T. Edwards Mckelvie MD; 04/12/2018 5:14 PM) MALIGNANT NEOPLASM OF RIGHT BREAST, STAGE 1, ESTROGEN RECEPTOR POSITIVE (C50.911) Impression: 72 year old female with a new diagnosis of cancer of the right breast, multicentric upper outer and upper inner quadrants.  Clinical stage 1A, ER positive, PR positive, HER-2 negative. I discussed with the patient and her husband again today initial surgical treatment options. We discussed options of breast conservation with lumpectomy or total mastectomy and sentinal lymph node biopsy/dissection. Options for reconstruction were discussed. we discussed that she would not be an ideal candidate for breast conservation due to 2 separate tumors but that double lumpectomy was feasible with some increased risk for positive margins, postoperative deformity and theoretically possibly in breast recurrence. After discussion they have elected to proceed with breast conservation with double lumpectomy and axillary sentinel lymph node biopsy. Her genetic testing is negative. We discussed the indications and nature of the procedure, and expected recovery, in detail. Surgical risks including anesthetic complications, cardiorespiratory complications, bleeding, infection, wound healing complications, blood clots, lymphedema, local and distant recurrence and possible need for further surgery based on the final pathology was discussed and understood. Chemotherapy, hormonal therapy and radiation therapy have been discussed. They have been provided with literature regarding the treatment of breast cancer. All questions were answered. They understand and agree to proceed and we are already scheduled for surgery Current Plans Schedule for Surgery  Radioactive seed localized right breast lumpectomy 2 and right axillary sentinel lymph node biopsy under general anesthesia as an outpatient.

## 2018-04-27 ENCOUNTER — Ambulatory Visit (HOSPITAL_COMMUNITY)
Admission: RE | Admit: 2018-04-27 | Discharge: 2018-04-27 | Disposition: A | Discharge status: Home / Self Care | Payer: Medicare Other | Source: Ambulatory Visit | Attending: General Surgery | Admitting: General Surgery

## 2018-04-27 ENCOUNTER — Ambulatory Visit
Admission: RE | Admit: 2018-04-27 | Discharge: 2018-04-27 | Disposition: A | Discharge status: Home / Self Care | Payer: Medicare Other | Source: Ambulatory Visit | Attending: General Surgery | Admitting: General Surgery

## 2018-04-27 ENCOUNTER — Other Ambulatory Visit: Payer: Self-pay

## 2018-04-27 ENCOUNTER — Ambulatory Visit (HOSPITAL_BASED_OUTPATIENT_CLINIC_OR_DEPARTMENT_OTHER): Payer: Medicare Other | Admitting: Certified Registered"

## 2018-04-27 ENCOUNTER — Encounter (HOSPITAL_BASED_OUTPATIENT_CLINIC_OR_DEPARTMENT_OTHER): Payer: Self-pay | Admitting: *Deleted

## 2018-04-27 ENCOUNTER — Encounter (HOSPITAL_BASED_OUTPATIENT_CLINIC_OR_DEPARTMENT_OTHER)
Admission: RE | Disposition: A | Discharge status: Home / Self Care | Payer: Self-pay | Source: Ambulatory Visit | Attending: General Surgery

## 2018-04-27 DIAGNOSIS — C50911 Malignant neoplasm of unspecified site of right female breast: Secondary | ICD-10-CM | POA: Diagnosis present

## 2018-04-27 DIAGNOSIS — Z888 Allergy status to other drugs, medicaments and biological substances status: Secondary | ICD-10-CM | POA: Diagnosis not present

## 2018-04-27 DIAGNOSIS — M549 Dorsalgia, unspecified: Secondary | ICD-10-CM | POA: Diagnosis not present

## 2018-04-27 DIAGNOSIS — F419 Anxiety disorder, unspecified: Secondary | ICD-10-CM | POA: Diagnosis not present

## 2018-04-27 DIAGNOSIS — Z79899 Other long term (current) drug therapy: Secondary | ICD-10-CM | POA: Diagnosis not present

## 2018-04-27 DIAGNOSIS — C50211 Malignant neoplasm of upper-inner quadrant of right female breast: Secondary | ICD-10-CM | POA: Diagnosis not present

## 2018-04-27 DIAGNOSIS — Z170 Estrogen receptor positive status [ER+]: Secondary | ICD-10-CM | POA: Diagnosis not present

## 2018-04-27 DIAGNOSIS — K219 Gastro-esophageal reflux disease without esophagitis: Secondary | ICD-10-CM | POA: Insufficient documentation

## 2018-04-27 DIAGNOSIS — Z91013 Allergy to seafood: Secondary | ICD-10-CM | POA: Insufficient documentation

## 2018-04-27 DIAGNOSIS — I10 Essential (primary) hypertension: Secondary | ICD-10-CM | POA: Diagnosis not present

## 2018-04-27 DIAGNOSIS — C50411 Malignant neoplasm of upper-outer quadrant of right female breast: Secondary | ICD-10-CM | POA: Diagnosis not present

## 2018-04-27 HISTORY — DX: Anxiety disorder, unspecified: F41.9

## 2018-04-27 HISTORY — PX: BREAST LUMPECTOMY: SHX2

## 2018-04-27 HISTORY — PX: BREAST LUMPECTOMY WITH RADIOACTIVE SEED AND SENTINEL LYMPH NODE BIOPSY: SHX6550

## 2018-04-27 SURGERY — BREAST LUMPECTOMY WITH RADIOACTIVE SEED AND SENTINEL LYMPH NODE BIOPSY
Anesthesia: General | Site: Breast | Laterality: Right

## 2018-04-27 MED ORDER — GABAPENTIN 300 MG PO CAPS: 300.0000 mg | ORAL_CAPSULE | ORAL | Status: DC

## 2018-04-27 MED ORDER — TECHNETIUM TC 99M SULFUR COLLOID FILTERED
1.0000 | Freq: Once | INTRAVENOUS | Status: AC | PRN
  Administered 2018-04-27: 1 via INTRADERMAL

## 2018-04-27 MED ORDER — PROPOFOL 10 MG/ML IV BOLUS
INTRAVENOUS | Status: DC | PRN
  Administered 2018-04-27: 200 mg via INTRAVENOUS

## 2018-04-27 MED ORDER — EPHEDRINE SULFATE 50 MG/ML IJ SOLN
INTRAMUSCULAR | Status: DC | PRN
  Administered 2018-04-27 (×3): 10 mg via INTRAVENOUS

## 2018-04-27 MED ORDER — CHLORHEXIDINE GLUCONATE CLOTH 2 % EX PADS: 6.0000 | MEDICATED_PAD | Freq: Once | CUTANEOUS | Status: DC

## 2018-04-27 MED ORDER — SCOPOLAMINE 1 MG/3DAYS TD PT72
1.0000 | MEDICATED_PATCH | Freq: Once | TRANSDERMAL | Status: DC | PRN
  Administered 2018-04-27: 1.5 mg via TRANSDERMAL

## 2018-04-27 MED ORDER — ACETAMINOPHEN 500 MG PO TABS
1000.0000 mg | ORAL_TABLET | ORAL | Status: AC
  Administered 2018-04-27: 1000 mg via ORAL

## 2018-04-27 MED ORDER — CELECOXIB 200 MG PO CAPS
200.0000 mg | ORAL_CAPSULE | ORAL | Status: AC
  Administered 2018-04-27: 200 mg via ORAL

## 2018-04-27 MED ORDER — ACETAMINOPHEN 10 MG/ML IV SOLN: 1000.0000 mg | Freq: Once | INTRAVENOUS | Status: DC | PRN

## 2018-04-27 MED ORDER — ACETAMINOPHEN 500 MG PO TABS
ORAL_TABLET | ORAL | Status: AC
  Filled 2018-04-27: qty 2

## 2018-04-27 MED ORDER — CEFAZOLIN SODIUM-DEXTROSE 2-4 GM/100ML-% IV SOLN
2.0000 g | INTRAVENOUS | Status: AC
  Administered 2018-04-27: 2 g via INTRAVENOUS

## 2018-04-27 MED ORDER — SODIUM CHLORIDE 0.9 % IJ SOLN
INTRAMUSCULAR | Status: AC
  Filled 2018-04-27: qty 20

## 2018-04-27 MED ORDER — LIDOCAINE HCL (CARDIAC) PF 100 MG/5ML IV SOSY
PREFILLED_SYRINGE | INTRAVENOUS | Status: DC | PRN
  Administered 2018-04-27: 100 mg via INTRAVENOUS

## 2018-04-27 MED ORDER — MEPERIDINE HCL 25 MG/ML IJ SOLN: 6.2500 mg | INTRAMUSCULAR | Status: DC | PRN

## 2018-04-27 MED ORDER — SCOPOLAMINE 1 MG/3DAYS TD PT72
MEDICATED_PATCH | TRANSDERMAL | Status: AC
  Filled 2018-04-27: qty 1

## 2018-04-27 MED ORDER — BUPIVACAINE-EPINEPHRINE (PF) 0.25% -1:200000 IJ SOLN
INTRAMUSCULAR | Status: AC
  Filled 2018-04-27: qty 60

## 2018-04-27 MED ORDER — FENTANYL CITRATE (PF) 100 MCG/2ML IJ SOLN
INTRAMUSCULAR | Status: AC
Start: 2018-04-27 — End: ?
  Filled 2018-04-27: qty 2

## 2018-04-27 MED ORDER — LIDOCAINE HCL (PF) 1 % IJ SOLN
INTRAMUSCULAR | Status: AC
  Filled 2018-04-27: qty 30

## 2018-04-27 MED ORDER — LACTATED RINGERS IV SOLN
INTRAVENOUS | Status: DC
  Administered 2018-04-27 (×2): via INTRAVENOUS

## 2018-04-27 MED ORDER — PROPOFOL 500 MG/50ML IV EMUL
INTRAVENOUS | Status: AC
  Filled 2018-04-27: qty 50

## 2018-04-27 MED ORDER — HYDROMORPHONE HCL 1 MG/ML IJ SOLN
0.2500 mg | INTRAMUSCULAR | Status: DC | PRN
  Administered 2018-04-27: 0.5 mg via INTRAVENOUS

## 2018-04-27 MED ORDER — CEFAZOLIN SODIUM-DEXTROSE 2-4 GM/100ML-% IV SOLN
INTRAVENOUS | Status: AC
  Filled 2018-04-27: qty 100

## 2018-04-27 MED ORDER — DEXAMETHASONE SODIUM PHOSPHATE 10 MG/ML IJ SOLN
INTRAMUSCULAR | Status: AC
  Filled 2018-04-27: qty 1

## 2018-04-27 MED ORDER — ROPIVACAINE HCL 5 MG/ML IJ SOLN
INTRAMUSCULAR | Status: DC | PRN
  Administered 2018-04-27: 30 mL

## 2018-04-27 MED ORDER — BUPIVACAINE-EPINEPHRINE (PF) 0.5% -1:200000 IJ SOLN
INTRAMUSCULAR | Status: AC
  Filled 2018-04-27: qty 60

## 2018-04-27 MED ORDER — EPHEDRINE SULFATE 50 MG/ML IJ SOLN
INTRAMUSCULAR | Status: AC
  Filled 2018-04-27: qty 1

## 2018-04-27 MED ORDER — CELECOXIB 200 MG PO CAPS
ORAL_CAPSULE | ORAL | Status: AC
Start: 2018-04-27 — End: ?
  Filled 2018-04-27: qty 1

## 2018-04-27 MED ORDER — DEXAMETHASONE SODIUM PHOSPHATE 4 MG/ML IJ SOLN
INTRAMUSCULAR | Status: DC | PRN
  Administered 2018-04-27: 10 mg via INTRAVENOUS

## 2018-04-27 MED ORDER — FENTANYL CITRATE (PF) 100 MCG/2ML IJ SOLN
INTRAMUSCULAR | Status: DC | PRN
  Administered 2018-04-27: 50 ug via INTRAVENOUS

## 2018-04-27 MED ORDER — OXYCODONE HCL 5 MG PO TABS: 5.0000 mg | ORAL_TABLET | Freq: Four times a day (QID) | ORAL | 0 refills | Status: DC | PRN

## 2018-04-27 MED ORDER — HYDROMORPHONE HCL 1 MG/ML IJ SOLN
INTRAMUSCULAR | Status: AC
  Filled 2018-04-27: qty 0.5

## 2018-04-27 MED ORDER — FENTANYL CITRATE (PF) 100 MCG/2ML IJ SOLN
50.0000 ug | INTRAMUSCULAR | Status: DC | PRN
  Administered 2018-04-27: 50 ug via INTRAVENOUS

## 2018-04-27 MED ORDER — GABAPENTIN 300 MG PO CAPS
ORAL_CAPSULE | ORAL | Status: AC
  Filled 2018-04-27: qty 1

## 2018-04-27 MED ORDER — HYDROCODONE-ACETAMINOPHEN 7.5-325 MG PO TABS: 1.0000 | ORAL_TABLET | Freq: Once | ORAL | Status: DC | PRN

## 2018-04-27 MED ORDER — MIDAZOLAM HCL 2 MG/2ML IJ SOLN
1.0000 mg | INTRAMUSCULAR | Status: DC | PRN
  Administered 2018-04-27: 1 mg via INTRAVENOUS

## 2018-04-27 MED ORDER — METHYLENE BLUE 0.5 % INJ SOLN
INTRAVENOUS | Status: AC
  Filled 2018-04-27: qty 20

## 2018-04-27 MED ORDER — BUPIVACAINE-EPINEPHRINE (PF) 0.5% -1:200000 IJ SOLN
INTRAMUSCULAR | Status: DC | PRN
  Administered 2018-04-27: 20 mL

## 2018-04-27 MED ORDER — MIDAZOLAM HCL 2 MG/2ML IJ SOLN
INTRAMUSCULAR | Status: AC
  Filled 2018-04-27: qty 2

## 2018-04-27 MED ORDER — FENTANYL CITRATE (PF) 100 MCG/2ML IJ SOLN
INTRAMUSCULAR | Status: AC
  Filled 2018-04-27: qty 2

## 2018-04-27 MED ORDER — ONDANSETRON HCL 4 MG/2ML IJ SOLN
INTRAMUSCULAR | Status: DC | PRN
  Administered 2018-04-27: 4 mg via INTRAVENOUS

## 2018-04-27 MED ORDER — ONDANSETRON HCL 4 MG/2ML IJ SOLN
INTRAMUSCULAR | Status: AC
  Filled 2018-04-27: qty 2

## 2018-04-27 SURGICAL SUPPLY — 40 items
BINDER BREAST XLRG (GAUZE/BANDAGES/DRESSINGS) ×2 IMPLANT
BLADE SURG 15 STRL LF DISP TIS (BLADE) ×1 IMPLANT
BLADE SURG 15 STRL SS (BLADE) ×1
CANISTER SUCT 1200ML W/VALVE (MISCELLANEOUS) IMPLANT
CHLORAPREP W/TINT 26ML (MISCELLANEOUS) ×2 IMPLANT
CLIP VESOCCLUDE SM WIDE 6/CT (CLIP) ×4 IMPLANT
COVER BACK TABLE 60X90IN (DRAPES) ×2 IMPLANT
COVER MAYO STAND STRL (DRAPES) ×2 IMPLANT
COVER PROBE W GEL 5X96 (DRAPES) ×2 IMPLANT
DERMABOND ADVANCED (GAUZE/BANDAGES/DRESSINGS) ×1
DERMABOND ADVANCED .7 DNX12 (GAUZE/BANDAGES/DRESSINGS) ×1 IMPLANT
DEVICE DUBIN W/COMP PLATE 8390 (MISCELLANEOUS) ×4 IMPLANT
DRAPE LAPAROSCOPIC ABDOMINAL (DRAPES) ×2 IMPLANT
DRAPE UTILITY XL STRL (DRAPES) ×2 IMPLANT
ELECT COATED BLADE 2.86 ST (ELECTRODE) ×2 IMPLANT
ELECT REM PT RETURN 9FT ADLT (ELECTROSURGICAL) ×2
ELECTRODE REM PT RTRN 9FT ADLT (ELECTROSURGICAL) ×1 IMPLANT
GLOVE BIO SURGEON STRL SZ 6.5 (GLOVE) ×2 IMPLANT
GLOVE BIOGEL PI IND STRL 7.0 (GLOVE) ×1 IMPLANT
GLOVE BIOGEL PI IND STRL 8 (GLOVE) ×2 IMPLANT
GLOVE BIOGEL PI INDICATOR 7.0 (GLOVE) ×1
GLOVE BIOGEL PI INDICATOR 8 (GLOVE) ×2
GLOVE ECLIPSE 7.5 STRL STRAW (GLOVE) ×6 IMPLANT
GOWN STRL REUS W/ TWL LRG LVL3 (GOWN DISPOSABLE) ×1 IMPLANT
GOWN STRL REUS W/ TWL XL LVL3 (GOWN DISPOSABLE) ×1 IMPLANT
GOWN STRL REUS W/TWL LRG LVL3 (GOWN DISPOSABLE) ×1
GOWN STRL REUS W/TWL XL LVL3 (GOWN DISPOSABLE) ×1
KIT MARKER MARGIN INK (KITS) ×2 IMPLANT
NEEDLE HYPO 25X1 1.5 SAFETY (NEEDLE) ×2 IMPLANT
NS IRRIG 1000ML POUR BTL (IV SOLUTION) ×2 IMPLANT
PACK BASIN DAY SURGERY FS (CUSTOM PROCEDURE TRAY) ×2 IMPLANT
PENCIL BUTTON HOLSTER BLD 10FT (ELECTRODE) ×2 IMPLANT
SLEEVE SCD COMPRESS KNEE MED (MISCELLANEOUS) ×2 IMPLANT
SPONGE LAP 4X18 RFD (DISPOSABLE) ×2 IMPLANT
SUT MON AB 5-0 PS2 18 (SUTURE) ×2 IMPLANT
SUT VICRYL 3-0 CR8 SH (SUTURE) ×2 IMPLANT
SYR CONTROL 10ML LL (SYRINGE) ×2 IMPLANT
TOWEL GREEN STERILE FF (TOWEL DISPOSABLE) ×2 IMPLANT
TUBE CONNECTING 20X1/4 (TUBING) ×2 IMPLANT
YANKAUER SUCT BULB TIP NO VENT (SUCTIONS) ×2 IMPLANT

## 2018-04-27 NOTE — Progress Notes (Signed)
Assisted Dr. Houser with right, ultrasound guided, pectoralis block. Side rails up, monitors on throughout procedure. See vital signs in flow sheet. Tolerated Procedure well. 

## 2018-04-27 NOTE — Anesthesia Procedure Notes (Signed)
Anesthesia Regional Block: Pectoralis block   Pre-Anesthetic Checklist: ,, timeout performed, Correct Patient, Correct Site, Correct Laterality, Correct Procedure, Correct Position, site marked, Risks and benefits discussed,  Surgical consent,  Pre-op evaluation,  At surgeon's request and post-op pain management  Laterality: Right  Prep: chloraprep       Needles:  Injection technique: Single-shot  Needle Type: Echogenic Needle     Needle Length: 9cm  Needle Gauge: 21     Additional Needles:   Narrative:  Start time: 04/27/2018 8:00 AM End time: 04/27/2018 8:10 AM Injection made incrementally with aspirations every 5 mL.  Performed by: Personally  Anesthesiologist: Barnet Glasgow, MD

## 2018-04-27 NOTE — Transfer of Care (Signed)
Immediate Anesthesia Transfer of Care Note  Patient: Kendra [name]  Procedure(s) Performed: RIGHT BREAST LUMPECTOMY WITH RADIOACTIVE SEED X 2 AND RIGHT SENTINEL LYMPH NODE BIOPSY (Right Breast)  Patient Location: PACU  Anesthesia Type:GA combined with regional for post-op pain  Level of Consciousness: awake, sedated and patient cooperative  Airway & Oxygen Therapy: Patient Spontanous Breathing and Patient connected to face mask oxygen  Post-op Assessment: Report given to RN and Post -op Vital signs reviewed and stable  Post vital signs: Reviewed and stable  Last Vitals:  Vitals Value Taken Time  BP    Temp    Pulse 74 04/27/2018 10:05 AM  Resp 11 04/27/2018 10:05 AM  SpO2 100 % 04/27/2018 10:05 AM  Vitals shown include unvalidated device data.  Last Pain:  Vitals:   04/27/18 0711  TempSrc: Oral  PainSc:          Complications: No apparent anesthesia complications

## 2018-04-27 NOTE — Anesthesia Procedure Notes (Signed)
Procedure Name: LMA Insertion Date/Time: 04/27/2018 8:32 AM Performed by: Lyndee Leo, CRNA Pre-anesthesia Checklist: Patient identified, Emergency Drugs available, Suction available and Patient being monitored Patient Re-evaluated:Patient Re-evaluated prior to induction Oxygen Delivery Method: Circle system utilized Preoxygenation: Pre-oxygenation with 100% oxygen Induction Type: IV induction Ventilation: Mask ventilation without difficulty LMA: LMA inserted LMA Size: 4.0 Number of attempts: 1 Airway Equipment and Method: Bite block Placement Confirmation: positive ETCO2 Tube secured with: Tape Dental Injury: Teeth and Oropharynx as per pre-operative assessment

## 2018-04-27 NOTE — Anesthesia Postprocedure Evaluation (Signed)
Anesthesia Post Note  Patient: Kendra [name]  Procedure(s) Performed: RIGHT BREAST LUMPECTOMY WITH RADIOACTIVE SEED X 2 AND RIGHT SENTINEL LYMPH NODE BIOPSY (Right Breast)     Patient location during evaluation: PACU Anesthesia Type: General Level of consciousness: awake and alert Pain management: pain level controlled Vital Signs Assessment: post-procedure vital signs reviewed and stable Respiratory status: spontaneous breathing, nonlabored ventilation, respiratory function stable and patient connected to nasal cannula oxygen Cardiovascular status: blood pressure returned to baseline and stable Postop Assessment: no apparent nausea or vomiting Anesthetic complications: no    Last Vitals:  Vitals:   04/27/18 1030 04/27/18 1144  BP: (!) 150/76 (!) 148/78  Pulse: 69 76  Resp: 11 18  Temp:  37.1 C  SpO2: 95% 98%    Last Pain:  Vitals:   04/27/18 1144  TempSrc: Oral  PainSc: 2                  Barnet Glasgow

## 2018-04-27 NOTE — Interval H&P Note (Signed)
History and Physical Interval Note:  04/27/2018 8:07 AM  Kendra [name]  has presented today for surgery, with the diagnosis of RIGHT BREAST CANCER  The various methods of treatment have been discussed with the patient and family. After consideration of risks, benefits and other options for treatment, the patient has consented to  Procedure(s): RIGHT BREAST LUMPECTOMY WITH RADIOACTIVE SEED X 2 AND RIGHT SENTINEL LYMPH NODE BIOPSY (Right) as a surgical intervention .  The patient's history has been reviewed, patient examined, no change in status, stable for surgery.  I have reviewed the patient's chart and labs.  Questions were answered to the patient's satisfaction.     Darene Lamer F[place] Gasner

## 2018-04-27 NOTE — Discharge Instructions (Signed)
[facility] °Office Phone Number [phone] ° °BREAST BIOPSY/ PARTIAL MASTECTOMY: POST OP INSTRUCTIONS ° °Always review your discharge instruction sheet given to you by the facility where your surgery was performed. ° °IF YOU HAVE DISABILITY OR FAMILY LEAVE FORMS, YOU MUST BRING THEM TO THE OFFICE FOR PROCESSING.  DO NOT GIVE THEM TO YOUR DOCTOR. ° °1. A prescription for pain medication may be given to you upon discharge.  Take your pain medication as prescribed, if needed.  If narcotic pain medicine is not needed, then you may take acetaminophen (Tylenol) or ibuprofen (Advil) as needed. °2. Take your usually prescribed medications unless otherwise directed °3. If you need a refill on your pain medication, please contact your pharmacy.  They will contact our office to request authorization.  Prescriptions will not be filled after 5pm or on week-ends. °4. You should eat very light the first 24 hours after surgery, such as soup, crackers, pudding, etc.  Resume your normal diet the day after surgery. °5. Most patients will experience some swelling and bruising in the breast.  Ice packs and a good support bra will help.  Swelling and bruising can take several days to resolve.  °6. It is common to experience some constipation if taking pain medication after surgery.  Increasing fluid intake and taking a stool softener will usually help or prevent this problem from occurring.  A mild laxative (Milk of Magnesia or Miralax) should be taken according to package directions if there are no bowel movements after 48 hours. °7. Unless discharge instructions indicate otherwise, you may remove your bandages 24-48 hours after surgery, and you may shower at that time.  You may have steri-strips (small skin tapes) in place directly over the incision.  These strips should be left on the skin for 7-10 days.  If your surgeon used skin glue on the incision, you may shower in 24 hours.  The glue will flake off over the  next 2-3 weeks.  Any sutures or staples will be removed at the office during your follow-up visit. °8. ACTIVITIES:  You may resume regular daily activities (gradually increasing) beginning the next day.  Wearing a good support bra or sports bra minimizes pain and swelling.  You may have sexual intercourse when it is comfortable. °a. You may drive when you no longer are taking prescription pain medication, you can comfortably wear a seatbelt, and you can safely maneuver your car and apply brakes. °b. RETURN TO WORK:  ______________________________________________________________________________________ °9. You should see your doctor in the office for a follow-up appointment approximately two weeks after your surgery.  Your doctor’s nurse will typically make your follow-up appointment when she calls you with your pathology report.  Expect your pathology report 2-3 business days after your surgery.  You may call to check if you do not hear from us after three days. °10. OTHER INSTRUCTIONS: _______________________________________________________________________________________________ _____________________________________________________________________________________________________________________________________ °_____________________________________________________________________________________________________________________________________ °_____________________________________________________________________________________________________________________________________ ° °WHEN TO CALL YOUR DOCTOR: °1. Fever over 101.0 °2. Nausea and/or vomiting. °3. Extreme swelling or bruising. °4. Continued bleeding from incision. °5. Increased pain, redness, or drainage from the incision. ° °The clinic staff is available to answer your questions during regular business hours.  Please don’t hesitate to call and ask to speak to one of the nurses for clinical concerns.  If you have a medical emergency, go to the nearest  emergency room or call 911.  A surgeon from [facility] is always on call at the hospital. ° °For further questions, please visit [email or web address]  ° ° ° ° °  Post Anesthesia Home Care Instructions ° °Activity: °Get plenty of rest for the remainder of the day. A responsible individual must stay with you for 24 hours following the procedure.  °For the next 24 hours, DO NOT: °-Drive a car °-Operate machinery °-Drink alcoholic beverages °-Take any medication unless instructed by your physician °-Make any legal decisions or sign important papers. ° °Meals: °Start with liquid foods such as gelatin or soup. Progress to regular foods as tolerated. Avoid greasy, spicy, heavy foods. If nausea and/or vomiting occur, drink only clear liquids until the nausea and/or vomiting subsides. Call your physician if vomiting continues. ° °Special Instructions/Symptoms: °Your throat may feel dry or sore from the anesthesia or the breathing tube placed in your throat during surgery. If this causes discomfort, gargle with warm salt water. The discomfort should disappear within 24 hours. ° °If you had a scopolamine patch placed behind your ear for the management of post- operative nausea and/or vomiting: ° °1. The medication in the patch is effective for 72 hours, after which it should be removed.  Wrap patch in a tissue and discard in the trash. Wash hands thoroughly with soap and water. °2. You may remove the patch earlier than 72 hours if you experience unpleasant side effects which may include dry mouth, dizziness or visual disturbances. °3. Avoid touching the patch. Wash your hands with soap and water after contact with the patch. °  ° °

## 2018-04-27 NOTE — Op Note (Signed)
Preoperative Diagnosis: RIGHT BREAST CANCER  Postoprative Diagnosis: RIGHT BREAST CANCER   Procedure: Procedure(s): RIGHT BREAST LUMPECTOMY WITH RADIOACTIVE SEED X 2 AND RIGHT SENTINEL LYMPH NODE BIOPSY   Surgeon: [name] T   Assistants: None  Anesthesia:  General LMA anesthesia  Indications: 72 year old female with a new diagnosis of cancer of the right breast, multicentric upper outer and upper inner quadrants. Clinical stage 1A, ER positive, PR positive, HER-2 negative.  After work-up and discussion of treatment options detailed elsewhere the patient strongly desires breast conservation and we have elected to proceed with radioactive seed localized right breast lumpectomy x2 and axillary sentinel lymph node biopsy as initial surgical therapy.      Procedure Detail: Patient had previously undergone accurate placement of radioactive seeds at the tumor and clip sites in the right upper outer and upper inner quadrants.  In the holding area she underwent a pectoral block and underwent injection of 1 mCi of technetium sulfur colloid intradermally around the right nipple.  She was taken to the operating room, placed in the supine position on the operating table, and general LMA anesthesia induced.  She was carefully positioned with her right arm extended and the entire right chest and breast, axilla and upper arm were widely sterilely prepped and draped.  She received preoperative IV antibiotics.  PAS were in place.  Patient timeout was performed and correct procedure verified. The upper outer mass was approached initially.  This was actually palpable.  I made a curvilinear incision at the areolar border and a short skin and subcutaneous flap was raised superiorly and laterally overlying the area of high counts and the palpable mass.  This measured about 2 cm.  Using the neoprobe and the palpable abnormality for guidance a generous globular lumpectomy specimen measuring about 4 to 5 cm in  diameter was excised.  This was carried down to the chest wall and the posterior margin appeared to be the closest by neoprobe and palpation.  The specimen was inked for margins and specimen x-ray showed the mass and seton marking clip centrally located.  Attention was then turned to the medial mass.  I extended the circumareolar incision medially and again a short skin and subcutaneous flap was developed superiorly and medially over the area of high counts.  This was a smaller mass clinically measuring about 1 cm.  Using the neoprobe for guidance I excised an approximately 2-1/2 to 3 cm globular specimen of breast tissue around the seed.  This specimen was also inked and specimen x-ray again showed the seton marking clip centrally located.  This was also taken down to the chest wall.  Hemostasis was assured.  The lumpectomy cavity in both cases was marked with clips.  The deep breast tissue and some cutaneous tissue was closed with interrupted 3-0 Vicryl. Attention was turned to the sentinel lymph node biopsy.  A hot area in the right axilla was localized and a small transverse incision made.  Dissection was carried down to the simultaneous tissue with cautery.  The clavipectoral fascia was incised.  The neoprobe for guidance identified a deep axillary lymph node near the latissimus with elevated counts.  It was soft.  This was completely excised with cautery and had counts of about 350.  A second nearby lymph node was identified with elevated counts of about 200 and also excised with cautery.  At this point background in the axilla was essentially 0 and I did not feel any enlarged lymph nodes.  Hemostasis was assured.  The deep axillary and subtenons tissue was closed with interrupted 3-0 Vicryl.  Both incisions were infiltrated with Marcaine and skin closed with running some particular 5-0 Monocryl and Dermabond.  Sponge needle and instrument counts were correct.    Findings: As above  Estimated Blood  Loss:  Minimal         Drains: None  Blood Given: none          Specimens: #1 right breast lumpectomy lateral #2 right breast lumpectomy medial #3 right axillary sentinel lymph nodes X 2        Complications:  * No complications entered in OR log *         Disposition: PACU - hemodynamically stable.         Condition: stable

## 2018-04-28 ENCOUNTER — Encounter (HOSPITAL_BASED_OUTPATIENT_CLINIC_OR_DEPARTMENT_OTHER): Payer: Self-pay | Admitting: General Surgery

## 2018-05-06 ENCOUNTER — Telehealth: Payer: Self-pay | Admitting: *Deleted

## 2018-05-06 ENCOUNTER — Inpatient Hospital Stay: Payer: Medicare Other | Attending: Obstetrics | Admitting: Hematology and Oncology

## 2018-05-06 DIAGNOSIS — Z170 Estrogen receptor positive status [ER+]: Secondary | ICD-10-CM

## 2018-05-06 DIAGNOSIS — C50211 Malignant neoplasm of upper-inner quadrant of right female breast: Secondary | ICD-10-CM | POA: Insufficient documentation

## 2018-05-06 DIAGNOSIS — Z79899 Other long term (current) drug therapy: Secondary | ICD-10-CM | POA: Insufficient documentation

## 2018-05-06 NOTE — Assessment & Plan Note (Signed)
04/27/2018:Right lumpectomy lateral: IDC grade 2, 2.5 cm, intermediate grade DCIS; right lumpectomy medial: IDC grade 2 1.1 cm, LVIDS present, intermediate grade DCIS, margins negative, 0/2 lymph nodes negative both tumors are ER PR positive HER-2 negative with a Ki-67 of 10%, T2N0 stage  Pathology counseling: I discussed the final pathology report of the patient provided  a copy of this report. I discussed the margins as well as lymph node surgeries. We also discussed the final staging along with previously performed ER/PR and HER-2/neu testing.  Treatment plan: 1.Oncotype DX testing to determine if chemotherapy would be of any benefit followed by 2. Adjuvant radiation therapy followed by 3. Adjuvant antiestrogen therapy  Return to clinic based upon Oncotype DX test report

## 2018-05-06 NOTE — Telephone Encounter (Signed)
Ordered onoctype per Dr. Lindi Adie.  Faxed requisition to pathology and confirmed receipt. Will test 1.1cm tumor first. Notified pathology.

## 2018-05-06 NOTE — Progress Notes (Signed)
Patient Care Team: [name], MD as PCP - General (Obstetrics and Gynecology) [name], MD as Consulting Physician (General Surgery) Nicholas Lose, MD as Consulting Physician (Hematology and Oncology) Gery Pray, MD as Consulting Physician (Radiation Oncology)  DIAGNOSIS:  Encounter Diagnosis  Name Primary?  . Malignant neoplasm of upper-inner quadrant of right breast in female, estrogen receptor positive ([place])     SUMMARY OF ONCOLOGIC HISTORY:   Malignant neoplasm of upper-inner quadrant of right breast in female, estrogen receptor positive ([place])   03/14/2018 Initial Diagnosis    Right breast mass noted on a CT scan, on mammogram 2 separate masses were noted.  2.2 cm mass at 11 o'clock position: IDC grade 2 with DCIS, ER 95%, PR 70%, Ki-67 10%,HER-2 negative ratio 1.3; second mass 1.3 cm at 1 o'clock position: IDC grade 2, ER 100%, PR 70%, Ki-67 10%, HER-2 negative ratio 1.6, T2N0 stage Ib clinical stage AJCC 8      04/27/2018 Surgery    Right lumpectomy lateral: IDC grade 2, 2.5 cm, intermediate grade DCIS; right lumpectomy medial: IDC grade 2 1.1 cm, LVIDS present, intermediate grade DCIS, margins negative, 0/2 lymph nodes negative both tumors are ER PR positive HER-2 negative with a Ki-67 of 10%, T2N0 stage      05/06/2018 Cancer Staging    Staging form: Breast, AJCC 8th Edition - Pathologic: Stage IA (pT2(2), pN0, cM0, G2, ER+, PR+, HER2-) - Signed by Nicholas Lose, MD on 05/06/2018       CHIEF COMPLIANT: Follow-up after recent double lumpectomies for right multifocal breast cancer  INTERVAL HISTORY: Kendra [name] is a 72 year old with above-mentioned history of right breast cancer she had 2 areas of involvement biopsy-proven breast cancer.  Both of these were resected and she is here today to discuss the pathology report.  She is recovering very well from the recent surgery.  She denies any pain or discomfort in the breast however she does have discomfort in the  axilla.  REVIEW OF SYSTEMS:   Constitutional: Denies fevers, chills or abnormal weight loss Eyes: Denies blurriness of vision Ears, nose, mouth, throat, and face: Denies mucositis or sore throat Respiratory: Denies cough, dyspnea or wheezes Cardiovascular: Denies palpitation, chest discomfort Gastrointestinal:  Denies nausea, heartburn or change in bowel habits Skin: Denies abnormal skin rashes Lymphatics: Denies new lymphadenopathy or easy bruising Neurological:Denies numbness, tingling or new weaknesses Behavioral/Psych: Mood is stable, no new changes  Extremities: No lower extremity edema  All other systems were reviewed with the patient and are negative.  I have reviewed the past medical history, past surgical history, social history and family history with the patient and they are unchanged from previous note.  ALLERGIES:  is allergic to hydroxyzine and shrimp [shellfish allergy].  MEDICATIONS:  Current Outpatient Medications  Medication Sig Dispense Refill  . buPROPion (WELLBUTRIN XL) 300 MG 24 hr tablet Take 300 mg by mouth daily.    Marland Kitchen docusate sodium (COLACE) 100 MG capsule Take 100 mg by mouth as needed for mild constipation.    Marland Kitchen FLUoxetine (PROZAC) 20 MG capsule Take 20 mg by mouth daily.    Marland Kitchen lisinopril-hydrochlorothiazide (PRINZIDE,ZESTORETIC) 20-12.5 MG tablet Take 1 tablet by mouth daily.    Marland Kitchen LORazepam (ATIVAN) 0.5 MG tablet Take 0.5 mg by mouth daily as needed for anxiety.    Marland Kitchen OVER THE COUNTER MEDICATION Take 2 each by mouth at bedtime. OTC Pharma GABA sleep med at bedtime for sleep per pt.     . oxyCODONE (OXY IR/ROXICODONE) 5  MG immediate release tablet Take 1 tablet (5 mg total) by mouth every 6 (six) hours as needed for moderate pain, severe pain or breakthrough pain. 10 tablet 0   No current facility-administered medications for this visit.     PHYSICAL EXAMINATION: ECOG PERFORMANCE STATUS: 1 - Symptomatic but completely ambulatory  Vitals:   05/06/18  0945  BP: 138/76  Pulse: 76  Resp: 18  Temp: 98.4 F (36.9 C)  SpO2: 100%   Filed Weights   05/06/18 0945  Weight: 168 lb 3.2 oz (76.3 kg)    GENERAL:alert, no distress and comfortable SKIN: skin color, texture, turgor are normal, no rashes or significant lesions EYES: normal, Conjunctiva are pink and non-injected, sclera clear OROPHARYNX:no exudate, no erythema and lips, buccal mucosa, and tongue normal  NECK: supple, thyroid normal size, non-tender, without nodularity LYMPH:  no palpable lymphadenopathy in the cervical, axillary or inguinal LUNGS: clear to auscultation and percussion with normal breathing effort HEART: regular rate & rhythm and no murmurs and no lower extremity edema ABDOMEN:abdomen soft, non-tender and normal bowel sounds MUSCULOSKELETAL:no cyanosis of digits and no clubbing  NEURO: alert & oriented x 3 with fluent speech, no focal motor/sensory deficits EXTREMITIES: No lower extremity edema  LABORATORY DATA:  I have reviewed the data as listed CMP Latest Ref Rng & Units 03/23/2018 02/12/2018 02/11/2018  Glucose 70 - 140 mg/dL 101 117(H) 136(H)  BUN 7 - 26 mg/dL '12 7 8  '$ Creatinine 0.60 - 1.10 mg/dL 0.89 0.66 0.80  Sodium 136 - 145 mmol/L 138 135 132(L)  Potassium 3.5 - 5.1 mmol/L 3.8 3.7 4.0  Chloride 98 - 109 mmol/L 102 100(L) 98(L)  CO2 22 - 29 mmol/L '27 25 26  '$ Calcium 8.4 - 10.4 mg/dL 9.5 8.7(L) 8.4(L)  Total Protein 6.4 - 8.3 g/dL 6.6 - -  Total Bilirubin 0.2 - 1.2 mg/dL 0.3 - -  Alkaline Phos 40 - 150 U/L 57 - -  AST 5 - 34 U/L 34 - -  ALT 0 - 55 U/L 59(H) - -    Lab Results  Component Value Date   WBC 7.5 03/23/2018   HGB 13.0 03/23/2018   HCT 39.3 03/23/2018   MCV 84.6 03/23/2018   PLT 234 03/23/2018   NEUTROABS 4.9 03/23/2018    ASSESSMENT & PLAN:  Malignant neoplasm of upper-inner quadrant of right breast in female, estrogen receptor positive ([place]) 04/27/2018:Right lumpectomy lateral: IDC grade 2, 2.5 cm, intermediate grade DCIS; right  lumpectomy medial: IDC grade 2 1.1 cm, LVIDS present, intermediate grade DCIS, margins negative, 0/2 lymph nodes negative both tumors are ER PR positive HER-2 negative with a Ki-67 of 10%, T2N0 stage  Pathology counseling: I discussed the final pathology report of the patient provided  a copy of this report. I discussed the margins as well as lymph node surgeries. We also discussed the final staging along with previously performed ER/PR and HER-2/neu testing.  Treatment plan: 1.Oncotype DX testing to determine if chemotherapy would be of any benefit followed by 2. Adjuvant radiation therapy followed by 3. Adjuvant antiestrogen therapy  Return to clinic based upon Oncotype DX test report    No orders of the defined types were placed in this encounter.  The patient has a good understanding of the overall plan. she agrees with it. she will call with any problems that may develop before the next visit here.   Harriette Ohara, MD 05/06/18

## 2018-05-20 ENCOUNTER — Other Ambulatory Visit: Payer: Self-pay | Admitting: General Surgery

## 2018-05-20 DIAGNOSIS — M7989 Other specified soft tissue disorders: Secondary | ICD-10-CM

## 2018-05-23 ENCOUNTER — Ambulatory Visit
Admission: RE | Admit: 2018-05-23 | Discharge: 2018-05-23 | Disposition: A | Discharge status: Home / Self Care | Payer: Medicare Other | Source: Ambulatory Visit | Attending: General Surgery | Admitting: General Surgery

## 2018-05-23 DIAGNOSIS — M7989 Other specified soft tissue disorders: Secondary | ICD-10-CM

## 2018-05-26 ENCOUNTER — Encounter: Payer: Medicare Other | Admitting: Physical Therapy

## 2018-05-31 ENCOUNTER — Encounter: Payer: Self-pay | Admitting: Hematology and Oncology

## 2018-05-31 ENCOUNTER — Telehealth: Payer: Self-pay | Admitting: *Deleted

## 2018-05-31 DIAGNOSIS — C50211 Malignant neoplasm of upper-inner quadrant of right female breast: Secondary | ICD-10-CM

## 2018-05-31 DIAGNOSIS — Z170 Estrogen receptor positive status [ER+]: Principal | ICD-10-CM

## 2018-05-31 NOTE — Telephone Encounter (Signed)
Spoke to pt regarding oncotype score of 12. Informed low risk and does not need chemotherapy. Pt informed she would like to transfer to [facility] d/t she lives in [place]. Referrals made. Encourage pt to call with further questions or needs. Received verbal understanding.

## 2018-05-31 NOTE — Telephone Encounter (Signed)
Received oncotype score of 12/3%. Physician team notified. Left vm for pt to return call to discuss results and next steps. Contact information provided.

## 2018-06-01 ENCOUNTER — Telehealth: Payer: Self-pay | Admitting: Radiation Oncology

## 2018-06-01 NOTE — Telephone Encounter (Signed)
2nd Attempt to contact pt with appts scheduled for 06/03/18. Pt is transferring care from [facility]. VM left for pt to contact office to confirm appts or r\s at her convenience.

## 2018-06-03 ENCOUNTER — Encounter: Payer: Self-pay | Admitting: *Deleted

## 2018-06-03 ENCOUNTER — Ambulatory Visit
Admission: RE | Admit: 2018-06-03 | Discharge: 2018-06-03 | Disposition: A | Discharge status: Home / Self Care | Payer: Medicare Other | Source: Ambulatory Visit | Attending: Radiation Oncology | Admitting: Radiation Oncology

## 2018-06-03 ENCOUNTER — Encounter: Payer: Self-pay | Admitting: Hematology and Oncology

## 2018-06-03 ENCOUNTER — Inpatient Hospital Stay: Payer: Medicare Other | Attending: Hematology and Oncology | Admitting: Hematology and Oncology

## 2018-06-03 VITALS — BP 143/87 | HR 66 | Temp 98.2°F | Resp 18 | Ht 66.0 in | Wt 170.2 lb

## 2018-06-03 DIAGNOSIS — C50411 Malignant neoplasm of upper-outer quadrant of right female breast: Secondary | ICD-10-CM | POA: Diagnosis not present

## 2018-06-03 DIAGNOSIS — Z79899 Other long term (current) drug therapy: Secondary | ICD-10-CM | POA: Diagnosis not present

## 2018-06-03 DIAGNOSIS — Z803 Family history of malignant neoplasm of breast: Secondary | ICD-10-CM | POA: Insufficient documentation

## 2018-06-03 DIAGNOSIS — Z801 Family history of malignant neoplasm of trachea, bronchus and lung: Secondary | ICD-10-CM

## 2018-06-03 DIAGNOSIS — Z170 Estrogen receptor positive status [ER+]: Secondary | ICD-10-CM | POA: Diagnosis not present

## 2018-06-03 DIAGNOSIS — Z510 Encounter for antineoplastic radiation therapy: Secondary | ICD-10-CM | POA: Diagnosis not present

## 2018-06-03 DIAGNOSIS — C50911 Malignant neoplasm of unspecified site of right female breast: Secondary | ICD-10-CM | POA: Diagnosis not present

## 2018-06-03 DIAGNOSIS — C50211 Malignant neoplasm of upper-inner quadrant of right female breast: Secondary | ICD-10-CM | POA: Insufficient documentation

## 2018-06-03 NOTE — Progress Notes (Signed)
[facility] day:  06/03/2018  Chief Complaint: Kendra [name] is a 73 y.o. female with multifocal stage IA right breast cancer who is seen for new patient assessment.  HPI:  The patient initially had a CT of the abdomen (done to evaluate an ovarian fibroid mass ) on 02/03/2018 which revealed a right breast mass.  Mammogram on 03/14/2018 revealed 2 separate masses (2.2 cm in size at 11 o'clock position and 1.3 cm at 1 o'clock position) approximately 5.7 cm apart.  Biopsy of both of these revealed grade 2 invasive ductal carcinoma that was ER + (100%), PR + (70%), and HER-2 negative with a Ki-67 of 10% with accompanying DCIS.    She underwent exploratory laparotomy, bilateral salpingo-oophrectomy, and pelvic washings on 02/10/2018 by Dr. Precious Haws. 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  Peritoneal washings revealed atypical cells.  She was presented at the multidisciplinary tumor board on 03/23/2018.  Recommendation was 2 lumpectomies versus right mastectomy.  Adjuvant radiation would be planned if she underwent lumpectomy.  Oncotype DX testing was recommended to determine if chemotherapy would be of any benefit followed by adjuvant antiestrogen therapy.  She underwent right lumpectomy on 04/27/2018.  Right lateral lesion revealed a 2.5 cm grade II invasive ductal carcinoma with intermediate grade DCIS.  The medial medial lesion revealed a 1.1 cm grade II invasive ductal carcinoma with intermediate grade DCIS.  Margins were negative.  Zero of 2 lymph nodes were positive. Both tumors were ER and PR positive and HER-2 negative.  Ki-67 was 10%.  Pathologic stage was mT2N0.  Oncotype DX revealed a recurrence score of 12 which translated to a distant recurrence of 3% at 9 years with an AI or tamoxifen alone.  The absolute benefit of chemotherapy was < 1%.  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  MSH6 gene is associated with autosomal dominant Lynch syndrome and autosomal recessive consitutional MMR syndrome.  Clinical significance is unknown.  Mother and paternal cousin had breast cancer in their early 11s.  Symptomatically, patient is doing "pretty good considering that I have had 2 surgeries in the last few months". Energy level is decreased, however she is trying to remain as active as possible. Patient denies nausea, vomiting, and changes to her bowel habits. She has no pain. Patient denies fevers and weight loss. Patient states, "I get clammy every once in awhile. Its like my skin needs to season". No drenching night sweats.   Patient denies bleeding; no hematochezia, melena, or gross hematuria. She has never had a colonoscopy, however she has had Cologuard testing. Patient has senile purpura.   She denies pain in the clinic today.    Past Medical History:  Diagnosis Date  . Anxiety   . Complication of anesthesia   . Depression   . Family history of breast cancer 03/24/2018  . Family history of lung cancer 03/24/2018  . History of alcohol dependence ([place]) 2007   no ETOH; resolved since 2007  . History of psychosis 2014   due to sleep disturbance  . Hypertension   . PONV (postoperative nausea and vomiting)     Past Surgical History:  Procedure Laterality Date  . BREAST LUMPECTOMY WITH RADIOACTIVE SEED AND SENTINEL LYMPH NODE BIOPSY Right 04/27/2018   Procedure: RIGHT BREAST LUMPECTOMY WITH RADIOACTIVE SEED X 2 AND RIGHT SENTINEL LYMPH NODE BIOPSY;  Surgeon: [name], MD;  Location: [facility];  Service:  General;  Laterality: Right;  . LAPAROTOMY N/A 02/10/2018   Procedure: EXPLORATORY LAPAROTOMY;  Surgeon: Isabel Caprice, MD;  Location: [facility];  Service: Gynecology;  Laterality: N/A;  . MASS EXCISION  01/2018   abdominal  . OVARIAN CYST REMOVAL    . SALPINGOOPHORECTOMY Bilateral 02/10/2018   Procedure: BILATERAL SALPINGO  OOPHORECTOMY; PERITONEAL WASHINGS;  Surgeon: Isabel Caprice, MD;  Location: [facility];  Service: Gynecology;  Laterality: Bilateral;  . TONSILLECTOMY    . TONSILLECTOMY AND ADENOIDECTOMY  1953    Family History  Problem Relation Age of Onset  . Diabetes Mother   . Breast cancer Mother 62  . Hypertension Mother   . Cancer Mother   . Lung cancer Father        asbestos exposure  . Cancer Father   . Heart disease Brother 47  . Breast cancer Cousin 85       paternal cousin  . Cancer Cousin   . Heart disease Paternal Grandmother 45  . Heart disease Paternal Grandfather     Social History:  reports that she has never smoked. She has never used smokeless tobacco. She reports that she drank alcohol. She reports that she does not use drugs.  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 She lives in [place].  The patient is accompanied by he patient accompanied by her husband and Tanya Nones (nurse navigator). today.  Allergies:  Allergies  Allergen Reactions  . Hydroxyzine Anaphylaxis    Tongue swollen  . Shrimp [Shellfish Allergy] Other (See Comments)    Food poisoning     Current Medications: Current Outpatient Medications  Medication Sig Dispense Refill  . docusate sodium (COLACE) 100 MG capsule Take 100 mg by mouth as needed for mild constipation.    Marland Kitchen lisinopril-hydrochlorothiazide (PRINZIDE,ZESTORETIC) 20-12.5 MG tablet Take 1 tablet by mouth daily.    Marland Kitchen OVER THE COUNTER MEDICATION Take 2 each by mouth at bedtime. OTC Pharma GABA sleep med at bedtime for sleep per pt.      No current facility-administered medications for this visit.     Review of Systems:  GENERAL:  Feels "pretty good".  Fatigued ("less energy than used to have"). Episodes of "clamminess".  No fevers, sweats or weight loss. PERFORMANCE STATUS (ECOG):  1 HEENT:  Sore throat s/p intubation.  No visual changes, runny nose, mouth sores or tenderness. Lungs: No shortness of breath or cough.  No  hemoptysis. Cardiac:  No chest pain, palpitations, orthopnea, or PND. GI:  No nausea, vomiting, diarrhea, constipation, melena or hematochezia. GU:  No urgency, frequency, dysuria, or hematuria. Musculoskeletal:  Bones ache.  No back pain.  No joint pain.  No muscle tenderness. Extremities:  No pain or swelling. Skin:  Senile purpura. No rashes or skin changes. Neuro:  No headache, numbness or weakness, balance or coordination issues. Endocrine:  No diabetes, thyroid issues, hot flashes or night sweats. Psych:  No mood changes, depression or anxiety. Pain:  No focal pain. Review of systems:  All other systems reviewed and found to be negative.  Physical Exam: Blood pressure (!) 143/87, pulse 66, temperature 98.2 F (36.8 C), temperature source Tympanic, resp. rate 18, height '5\' 6"'$  (1.676 m), weight 170 lb 4 oz (77.2 kg). GENERAL:  Well developed, well nourished, woman sitting comfortably in the exam room in no acute distress. MENTAL STATUS:  Alert and oriented to person, place and time. HEAD:  Brown hair with blonde highlights.  Normocephalic, atraumatic, face symmetric, no  Cushingoid features. EYES:  Blue eyes.  Pupils equal round and reactive to light and accomodation.  No conjunctivitis or scleral icterus. ENT:  Oropharynx clear without lesion.  Tongue normal. Mucous membranes moist.  RESPIRATORY:  Clear to auscultation without rales, wheezes or rhonchi. CARDIOVASCULAR:  Regular rate and rhythm without murmur, rub or gallop. BREAST:  Right breast with post-operative changes at 9-10 o'clock.  No masses, skin changes or nipple discharge.  Left breast without masses, skin changes or nipple discharge.  ABDOMEN:  Soft, non-tender, with active bowel sounds, and no hepatosplenomegaly.  No masses. SKIN:  Senile purpura. No rashes, ulcers or lesions. EXTREMITIES: No edema, no skin discoloration or tenderness.  No palpable cords. LYMPH NODES: No palpable cervical, supraclavicular, axillary or  inguinal adenopathy  NEUROLOGICAL: Unremarkable. PSYCH:  Appropriate.   No visits with results within 3 Day(s) from this visit.  Latest known visit with results is:  Appointment on 03/23/2018  Component Date Value Ref Range Status  . WBC Count 03/23/2018 7.5  3.9 - 10.3 K/uL Final  . RBC 03/23/2018 4.64  3.70 - 5.45 MIL/uL Final  . Hemoglobin 03/23/2018 13.0  11.6 - 15.9 g/dL Final  . HCT 03/23/2018 39.3  34.8 - 46.6 % Final  . MCV 03/23/2018 84.6  79.5 - 101.0 fL Final  . MCH 03/23/2018 28.1  25.1 - 34.0 pg Final  . MCHC 03/23/2018 33.2  31.5 - 36.0 g/dL Final  . RDW 03/23/2018 14.6* 11.2 - 14.5 % Final  . Platelet Count 03/23/2018 234  145 - 400 K/uL Final  . Neutrophils Relative % 03/23/2018 64  % Final  . Neutro Abs 03/23/2018 4.9  1.5 - 6.5 K/uL Final  . Lymphocytes Relative 03/23/2018 21  % Final  . Lymphs Abs 03/23/2018 1.6  0.9 - 3.3 K/uL Final  . Monocytes Relative 03/23/2018 10  % Final  . Monocytes Absolute 03/23/2018 0.7  0.1 - 0.9 K/uL Final  . Eosinophils Relative 03/23/2018 4  % Final  . Eosinophils Absolute 03/23/2018 0.3  0.0 - 0.5 K/uL Final  . Basophils Relative 03/23/2018 1  % Final  . Basophils Absolute 03/23/2018 0.1  0.0 - 0.1 K/uL Final   Performed at [facility], [place] [address] [place], [place] [address]  . Sodium 03/23/2018 138  136 - 145 mmol/L Final  . Potassium 03/23/2018 3.8  3.5 - 5.1 mmol/L Final  . Chloride 03/23/2018 102  98 - 109 mmol/L Final  . CO2 03/23/2018 27  22 - 29 mmol/L Final  . Glucose, Bld 03/23/2018 101  70 - 140 mg/dL Final  . BUN 03/23/2018 12  7 - 26 mg/dL Final  . Creatinine 03/23/2018 0.89  0.60 - 1.10 mg/dL Final  . Calcium 03/23/2018 9.5  8.4 - 10.4 mg/dL Final  . Total Protein 03/23/2018 6.6  6.4 - 8.3 g/dL Final  . Albumin 03/23/2018 3.9  3.5 - 5.0 g/dL Final  . AST 03/23/2018 34  5 - 34 U/L Final  . ALT 03/23/2018 59* 0 - 55 U/L Final  . Alkaline Phosphatase 03/23/2018 57  40 - 150 U/L Final   . Total Bilirubin 03/23/2018 0.3  0.2 - 1.2 mg/dL Final  . GFR, Est Non Af Am 03/23/2018 >60  >60 mL/min Final  . GFR, Est AFR Am 03/23/2018 >60  >60 mL/min Final   Comment: (NOTE) The eGFR has been calculated using the CKD EPI equation. This calculation has not been validated in all clinical situations. eGFR's persistently <60 mL/min  signify possible Chronic Kidney Disease.   Georgiann Hahn gap 03/23/2018 9  3 - 11 Final   Performed at [facility], [place] [address], [place], [place] [address]    Assessment:  Kendra [name] is a 72 y.o. female with multi-focal stage IA right breast cancer s/p right lumpectomy on 04/27/2018.  Pathology of the right lateral lesion revealed a 2.5 cm grade II invasive ductal carcinoma with intermediate grade DCIS.  The medial lesion revealed a 1.1 cm grade II invasive ductal carcinoma with intermediate grade DCIS. LVIDS was present.  Margins were negative.  Zero of 2 lymph nodes were positive. Both tumors were ER + (100%), PR + (70%), and HER-2 negative.  Ki-67 was 10%.  Pathologic stage was mT2N0.  Oncotype DX revealed a recurrence score of 12 which translated to a distant recurrence of 3% at 9 years with an AI or tamxifen alone.  The absolute benefit of chemotherpay was < 1%.  [employer] testing on 03/23/2018 revealed a variant of uncertain significance in MSH6.   She underwent exploratory laparotomy, bilateral salpingo-oophrectomy, and pelvic washings on 02/10/2018. 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  Peritoneal washings revealed atypical cells.  Symptomatically, patient is fatigued. She denies any acute symptoms. She is recovering from 2 recent breast surgeries. No B symptoms or recent infections. Exam in grossly unremarkable.    Plan: 1. Multi-focal right breast cancer - treatment ongoing  Discuss pathology and Oncotype DX testing.  Discuss plans for radiation consult. Patient to see Dr. Baruch Gouty today at  11:00 am.  Discuss plans for post adjuvant endocrine therapy following radiation. Discussed use of aromatase inhibitor (Femara) x 5 years. Side effects briefly reviewed.   Discuss need for baseline bone density in anticipation of AI therapy. If osteopenia/osteoporosis found on the study, discussed the use of Prolia. Patient in agreement. Will schedule bone density.   2. Bone density study. 3. Discuss follow-up after radiation to initiate hormonal therapy. Will need to call for an appointment.    Honor Loh, NP  06/03/2018, 9:48 AM    I saw and evaluated the patient, participating in the key portions of the service and reviewing pertinent diagnostic studies and records.  I reviewed the nurse practitioner's note and agree with the findings and the plan.  The assessment and plan were discussed with the patient.  Additional diagnostic study of bone density study is needed to clarify bone health and would change the clinical management.  Several questions were asked by the patient and answered.   Nolon Stalls, MD 06/03/2018,9:48 AM     DIAGNOSIS:  Encounter Diagnoses  Name Primary?  . Malignant neoplasm of upper-outer quadrant of right breast in female, estrogen receptor positive ([place]) Yes  . Malignant neoplasm of upper-inner quadrant of right breast in female, estrogen receptor positive ([place])      SUMMARY OF ONCOLOGIC HISTORY:   Malignant neoplasm of upper-inner quadrant of right breast in female, estrogen receptor positive ([place])   03/14/2018 Initial Diagnosis    Right breast mass noted on a CT scan, on mammogram 2 separate masses were noted.  2.2 cm mass at 11 o'clock position: IDC grade 2 with DCIS, ER 95%, PR 70%, Ki-67 10%,HER-2 negative ratio 1.3; second mass 1.3 cm at 1 o'clock position: IDC grade 2, ER 100%, PR 70%, Ki-67 10%, HER-2 negative ratio 1.6, T2N0 stage Ib clinical stage AJCC 8      04/27/2018 Surgery    Right lumpectomy lateral: IDC grade 2, 2.5 cm,  intermediate grade  DCIS; right lumpectomy medial: IDC grade 2 1.1 cm, LVIDS present, intermediate grade DCIS, margins negative, 0/2 lymph nodes negative both tumors are ER PR positive HER-2 negative with a Ki-67 of 10%, T2N0 stage      05/06/2018 Cancer Staging    Staging form: Breast, AJCC 8th Edition - Pathologic: Stage IA (pT2(2), pN0, cM0, G2, ER+, PR+, HER2-) - Signed by Nicholas Lose, MD on 05/06/2018

## 2018-06-03 NOTE — Consult Note (Signed)
NEW PATIENT EVALUATION  Name: Kendra [name]  MRN: [id number]  Date:   06/03/2018     DOB: [birth year]   This 72 y.o. female patient presents to the clinic for initial evaluation of tage IIa (T2 N0 M0) invasive mammary carcinoma of the right breast in 2 separate foci status post wide local excision and sentinel node biopsy ER/PR positive.  REFERRING PHYSICIAN: [name], MD  CHIEF COMPLAINT: No chief complaint on file.   DIAGNOSIS: The encounter diagnosis was Malignant neoplasm of right breast in female, estrogen receptor positive, unspecified site of breast ([place]).   PREVIOUS INVESTIGATIONS:  Mammograms ultrasound reviewed Pathology reports reviewed Clinical notes reviewed  HPI: patient is a 72 year old female who presented with an abnormal CT scan of her breast which was performed as a preoperative evaluation for a benign fibroid of her ovary. Follow-up mammogram showing 2 separate masses a 2.2 cm mass in the 11:00 position of the right breast and a 1.3 cm mass at the 1:00 position 5. Centimeters apart.Biopsies of both these lesions showed grade 2 invasive ductal carcinoma ER/PR positive HER-2/neu negative. She underwent lumpectomy through the same incision of both masses.on June 26 pathology showed right lateral lumpectomy invasive ductal carcinomagrade 2/3 spanning 2.5 cm with margins clear but close at 0.1 cm to the posterior margin. The more medial lesion was a 1.1 cm invasive mammary carcinoma grade 2/3 with one vascular invasion present.Surgical margins were negative. 2 sentinel lymph nodes were negative for metastatic disease. Again tumors were ER/PR positive HER-2/neu not overexpressed. Patient underwent Oncotype DX which revealed a score of 12 showing minimal benefit for systemic chemotherapy. She is been denied systemic chemotherapy by medical oncology will have anti-estrogen therapy after completion of radiation. She is seen today for radiation oncology evaluation. She is doing  well. She specifically denies breast tenderness cough or bone pain  PLANNED TREATMENT REGIMEN: right breast external beam radiation therapy  PAST MEDICAL HISTORY:  has a past medical history of Anxiety, Complication of anesthesia, Depression, Family history of breast cancer (03/24/2018), Family history of lung cancer (03/24/2018), History of alcohol dependence ([place]) (2007), History of psychosis (2014), Hypertension, and PONV (postoperative nausea and vomiting).    PAST SURGICAL HISTORY:  Past Surgical History:  Procedure Laterality Date  . BREAST LUMPECTOMY WITH RADIOACTIVE SEED AND SENTINEL LYMPH NODE BIOPSY Right 04/27/2018   Procedure: RIGHT BREAST LUMPECTOMY WITH RADIOACTIVE SEED X 2 AND RIGHT SENTINEL LYMPH NODE BIOPSY;  Surgeon: [name], MD;  Location: [place];  Service: General;  Laterality: Right;  . LAPAROTOMY N/A 02/10/2018   Procedure: EXPLORATORY LAPAROTOMY;  Surgeon: Isabel Caprice, MD;  Location: [facility];  Service: Gynecology;  Laterality: N/A;  . MASS EXCISION  01/2018   abdominal  . OVARIAN CYST REMOVAL    . SALPINGOOPHORECTOMY Bilateral 02/10/2018   Procedure: BILATERAL SALPINGO OOPHORECTOMY; PERITONEAL WASHINGS;  Surgeon: Isabel Caprice, MD;  Location: [facility];  Service: Gynecology;  Laterality: Bilateral;  . TONSILLECTOMY    . TONSILLECTOMY AND ADENOIDECTOMY  1953    FAMILY HISTORY: family history includes Breast cancer (age of onset: 29) in her cousin; Breast cancer (age of onset: 17) in her mother; Cancer in her cousin, father, and mother; Diabetes in her mother; Heart disease in her paternal grandfather; Heart disease (age of onset: 27) in her brother; Heart disease (age of onset: 28) in her paternal grandmother; Hypertension in her mother; Lung cancer in her father.  SOCIAL HISTORY:  reports that she has never smoked. She has  never used smokeless tobacco. She reports that she drank alcohol. She reports that she does not use drugs.  ALLERGIES:  Hydroxyzine and Shrimp [shellfish allergy]  MEDICATIONS:  Current Outpatient Medications  Medication Sig Dispense Refill  . docusate sodium (COLACE) 100 MG capsule Take 100 mg by mouth as needed for mild constipation.    Marland Kitchen lisinopril-hydrochlorothiazide (PRINZIDE,ZESTORETIC) 20-12.5 MG tablet Take 1 tablet by mouth daily.    Marland Kitchen OVER THE COUNTER MEDICATION Take 2 each by mouth at bedtime. [employer] GABA sleep med at bedtime for sleep per pt.      No current facility-administered medications for this encounter.     ECOG PERFORMANCE STATUS:  0 - Asymptomatic  REVIEW OF SYSTEMS:  Patient denies any weight loss, fatigue, weakness, fever, chills or night sweats. Patient denies any loss of vision, blurred vision. Patient denies any ringing  of the ears or hearing loss. No irregular heartbeat. Patient denies heart murmur or history of fainting. Patient denies any chest pain or pain radiating to her upper extremities. Patient denies any shortness of breath, difficulty breathing at night, cough or hemoptysis. Patient denies any swelling in the lower legs. Patient denies any nausea vomiting, vomiting of blood, or coffee ground material in the vomitus. Patient denies any stomach pain. Patient states has had normal bowel movements no significant constipation or diarrhea. Patient denies any dysuria, hematuria or significant nocturia. Patient denies any problems walking, swelling in the joints or loss of balance. Patient denies any skin changes, loss of hair or loss of weight. Patient denies any excessive worrying or anxiety or significant depression. Patient denies any problems with insomnia. Patient denies excessive thirst, polyuria, polydipsia. Patient denies any swollen glands, patient denies easy bruising or easy bleeding. Patient denies any recent infections, allergies or URI. Patient "s visual fields have not changed significantly in recent time.    PHYSICAL EXAM: There were no vitals taken for this  visit. Right breast is a single wide local excision scar around the nipple areolar complex. No dominant mass or nodularity is noted in either breast in 2 positions examined. No axillary or supraclavicular adenopathy is noted. Patient has multiple keratoses of the skin. Well-developed well-nourished patient in NAD. HEENT reveals PERLA, EOMI, discs not visualized.  Oral cavity is clear. No oral mucosal lesions are identified. Neck is clear without evidence of cervical or supraclavicular adenopathy. Lungs are clear to A&P. Cardiac examination is essentially unremarkable with regular rate and rhythm without murmur rub or thrill. Abdomen is benign with no organomegaly or masses noted. Motor sensory and DTR levels are equal and symmetric in the upper and lower extremities. Cranial nerves II through XII are grossly intact. Proprioception is intact. No peripheral adenopathy or edema is identified. No motor or sensory levels are noted. Crude visual fields are within normal range.  LABORATORY DATA: pathology reports reviewed    RADIOLOGY RESULTS:CT scan mammograms ultrasound all reviewed and compatible above-stated findings   IMPRESSION: stageIIa (T2 N0 M0) invasive mammary carcinoma of the right breast 2 separate foci status post wide local excision of both and sentinel node biopsy for ER/PR positive HER-2/neu negative disease in 72 year old female  PLAN: at this time I believe the patient's rests are too large for hypofractionated course of treatment. I would plan on delivering 5040 cGy in 28 fractions. Based on the close margin also boost her scar another 1600 cGy using electron beam. Risks and benefits of treatment including skin reaction fatigue alteration of blood counts possible inclusion of superficial lung  all were described in detail to the patient. She seems to comprehend my treatment plan well. I've personally set up and ordered CT simulation for early next week. Patient also will be candidate for  antiestrogen therapy after completion of radiation. Patient and husband both seem to comprehend my treatment plan well.  I would like to take this opportunity to thank you for allowing me to participate in the care of your patient.Noreene Filbert, MD

## 2018-06-03 NOTE — Progress Notes (Signed)
  Oncology Nurse Navigator Documentation  Navigator Location: [facility] (06/03/18 1200)   )Navigator Encounter Type: Clinic/MDC (06/03/18 1200)                     Patient Visit Type: MedOnc (06/03/18 1200) Treatment Phase: Pre-Tx/Tx Discussion (06/03/18 1200) Barriers/Navigation Needs: No Questions (06/03/18 1200)                          Time Spent with Patient: 45 (06/03/18 1200)   Patient transferred to Dr. Mike Gip from Dr. Lindi Adie at [facility] for treatment of radiation therapy and antihormonal.  Met patient and her husband today.  Gave navigation information for [place].  She is to call if she has any questions or needs.

## 2018-06-03 NOTE — Progress Notes (Signed)
Patient here today as new evaluation regarding breast cancer.  Referred by Dr. Lindi Adie.  Patient is accompanied by her husband today.

## 2018-06-06 ENCOUNTER — Encounter (HOSPITAL_COMMUNITY): Payer: Self-pay | Admitting: Hematology and Oncology

## 2018-06-06 ENCOUNTER — Ambulatory Visit
Admission: RE | Admit: 2018-06-06 | Discharge: 2018-06-06 | Disposition: A | Discharge status: Home / Self Care | Payer: Medicare Other | Source: Ambulatory Visit | Attending: Radiation Oncology | Admitting: Radiation Oncology

## 2018-06-06 DIAGNOSIS — C50911 Malignant neoplasm of unspecified site of right female breast: Secondary | ICD-10-CM | POA: Diagnosis not present

## 2018-06-07 DIAGNOSIS — C50911 Malignant neoplasm of unspecified site of right female breast: Secondary | ICD-10-CM | POA: Diagnosis not present

## 2018-06-08 ENCOUNTER — Encounter (HOSPITAL_COMMUNITY): Payer: Self-pay | Admitting: Hematology and Oncology

## 2018-06-10 ENCOUNTER — Other Ambulatory Visit: Payer: Self-pay | Admitting: *Deleted

## 2018-06-10 DIAGNOSIS — Z170 Estrogen receptor positive status [ER+]: Principal | ICD-10-CM

## 2018-06-10 DIAGNOSIS — C50911 Malignant neoplasm of unspecified site of right female breast: Secondary | ICD-10-CM

## 2018-06-15 ENCOUNTER — Ambulatory Visit
Admission: RE | Admit: 2018-06-15 | Discharge: 2018-06-15 | Disposition: A | Discharge status: Home / Self Care | Payer: Medicare Other | Source: Ambulatory Visit | Attending: Radiation Oncology | Admitting: Radiation Oncology

## 2018-06-15 DIAGNOSIS — C50911 Malignant neoplasm of unspecified site of right female breast: Secondary | ICD-10-CM | POA: Diagnosis not present

## 2018-06-16 ENCOUNTER — Ambulatory Visit
Admission: RE | Admit: 2018-06-16 | Discharge: 2018-06-16 | Disposition: A | Discharge status: Home / Self Care | Payer: Medicare Other | Source: Ambulatory Visit | Attending: Radiation Oncology | Admitting: Radiation Oncology

## 2018-06-16 DIAGNOSIS — C50911 Malignant neoplasm of unspecified site of right female breast: Secondary | ICD-10-CM | POA: Diagnosis not present

## 2018-06-17 ENCOUNTER — Ambulatory Visit
Admission: RE | Admit: 2018-06-17 | Discharge: 2018-06-17 | Disposition: A | Discharge status: Home / Self Care | Payer: Medicare Other | Source: Ambulatory Visit | Attending: Radiation Oncology | Admitting: Radiation Oncology

## 2018-06-17 DIAGNOSIS — C50911 Malignant neoplasm of unspecified site of right female breast: Secondary | ICD-10-CM | POA: Diagnosis not present

## 2018-06-19 ENCOUNTER — Ambulatory Visit: Admission: RE | Admit: 2018-06-19 | Payer: Medicare Other | Source: Ambulatory Visit

## 2018-06-20 ENCOUNTER — Ambulatory Visit: Payer: Medicare Other

## 2018-06-20 ENCOUNTER — Ambulatory Visit
Admission: RE | Admit: 2018-06-20 | Discharge: 2018-06-20 | Disposition: A | Discharge status: Home / Self Care | Payer: Medicare Other | Source: Ambulatory Visit | Attending: Radiation Oncology | Admitting: Radiation Oncology

## 2018-06-20 DIAGNOSIS — C50911 Malignant neoplasm of unspecified site of right female breast: Secondary | ICD-10-CM | POA: Diagnosis not present

## 2018-06-21 ENCOUNTER — Ambulatory Visit
Admission: RE | Admit: 2018-06-21 | Discharge: 2018-06-21 | Disposition: A | Discharge status: Home / Self Care | Payer: Medicare Other | Source: Ambulatory Visit | Attending: Radiation Oncology | Admitting: Radiation Oncology

## 2018-06-21 DIAGNOSIS — C50911 Malignant neoplasm of unspecified site of right female breast: Secondary | ICD-10-CM | POA: Diagnosis not present

## 2018-06-22 ENCOUNTER — Encounter: Payer: Self-pay | Admitting: *Deleted

## 2018-06-22 ENCOUNTER — Ambulatory Visit
Admission: RE | Admit: 2018-06-22 | Discharge: 2018-06-22 | Disposition: A | Discharge status: Home / Self Care | Payer: Medicare Other | Source: Ambulatory Visit | Attending: Radiation Oncology | Admitting: Radiation Oncology

## 2018-06-22 DIAGNOSIS — C50911 Malignant neoplasm of unspecified site of right female breast: Secondary | ICD-10-CM | POA: Diagnosis not present

## 2018-06-22 NOTE — Progress Notes (Signed)
  Oncology Nurse Navigator Documentation  Navigator Location: [facility] (06/22/18 1600)   )Navigator Encounter Type: Clinic/MDC (06/22/18 1600)                         Barriers/Navigation Needs: Coordination of Care (06/22/18 1600)                          Time Spent with Patient: 15 (06/22/18 1600)   Met patient in radiation therapy today.  She had questions about a call she received with her lab appointment.  Confirmed her appointment for Monday.  She requested her care teams phone numbers.  I gave her the navigators cards and Dr. Olena Leatherwood card again.  She is to call with any questions or needs.

## 2018-06-23 ENCOUNTER — Ambulatory Visit
Admission: RE | Admit: 2018-06-23 | Discharge: 2018-06-23 | Disposition: A | Discharge status: Home / Self Care | Payer: Medicare Other | Source: Ambulatory Visit | Attending: Radiation Oncology | Admitting: Radiation Oncology

## 2018-06-23 DIAGNOSIS — C50911 Malignant neoplasm of unspecified site of right female breast: Secondary | ICD-10-CM | POA: Diagnosis not present

## 2018-06-24 ENCOUNTER — Ambulatory Visit
Admission: RE | Admit: 2018-06-24 | Discharge: 2018-06-24 | Disposition: A | Discharge status: Home / Self Care | Payer: Medicare Other | Source: Ambulatory Visit | Attending: Radiation Oncology | Admitting: Radiation Oncology

## 2018-06-24 DIAGNOSIS — C50911 Malignant neoplasm of unspecified site of right female breast: Secondary | ICD-10-CM | POA: Diagnosis not present

## 2018-06-27 ENCOUNTER — Ambulatory Visit
Admission: RE | Admit: 2018-06-27 | Discharge: 2018-06-27 | Disposition: A | Discharge status: Home / Self Care | Payer: Medicare Other | Source: Ambulatory Visit | Attending: Radiation Oncology | Admitting: Radiation Oncology

## 2018-06-27 ENCOUNTER — Other Ambulatory Visit: Payer: Self-pay

## 2018-06-27 ENCOUNTER — Inpatient Hospital Stay: Payer: Medicare Other

## 2018-06-27 DIAGNOSIS — Z170 Estrogen receptor positive status [ER+]: Principal | ICD-10-CM

## 2018-06-27 DIAGNOSIS — C50911 Malignant neoplasm of unspecified site of right female breast: Secondary | ICD-10-CM | POA: Diagnosis not present

## 2018-06-27 DIAGNOSIS — C50411 Malignant neoplasm of upper-outer quadrant of right female breast: Secondary | ICD-10-CM | POA: Diagnosis not present

## 2018-06-27 LAB — CBC
HCT: 39.3 % (ref 35.0–47.0)
Hemoglobin: 13.5 g/dL (ref 12.0–16.0)
MCH: 28.9 pg (ref 26.0–34.0)
MCHC: 34.3 g/dL (ref 32.0–36.0)
MCV: 84.3 fL (ref 80.0–100.0)
Platelets: 224 10*3/uL (ref 150–440)
RBC: 4.66 MIL/uL (ref 3.80–5.20)
RDW: 13.4 % (ref 11.5–14.5)
WBC: 5.6 10*3/uL (ref 3.6–11.0)

## 2018-06-28 ENCOUNTER — Ambulatory Visit
Admission: RE | Admit: 2018-06-28 | Discharge: 2018-06-28 | Disposition: A | Discharge status: Home / Self Care | Payer: Medicare Other | Source: Ambulatory Visit | Attending: Radiation Oncology | Admitting: Radiation Oncology

## 2018-06-28 ENCOUNTER — Ambulatory Visit
Admission: RE | Admit: 2018-06-28 | Discharge: 2018-06-28 | Disposition: A | Discharge status: Home / Self Care | Payer: Medicare Other | Source: Ambulatory Visit | Attending: Urgent Care | Admitting: Urgent Care

## 2018-06-28 DIAGNOSIS — Z170 Estrogen receptor positive status [ER+]: Secondary | ICD-10-CM | POA: Insufficient documentation

## 2018-06-28 DIAGNOSIS — Z13820 Encounter for screening for osteoporosis: Secondary | ICD-10-CM | POA: Insufficient documentation

## 2018-06-28 DIAGNOSIS — Z853 Personal history of malignant neoplasm of breast: Secondary | ICD-10-CM | POA: Insufficient documentation

## 2018-06-28 DIAGNOSIS — C50411 Malignant neoplasm of upper-outer quadrant of right female breast: Secondary | ICD-10-CM | POA: Diagnosis present

## 2018-06-28 DIAGNOSIS — C50911 Malignant neoplasm of unspecified site of right female breast: Secondary | ICD-10-CM | POA: Diagnosis not present

## 2018-06-28 HISTORY — DX: Personal history of irradiation: Z92.3

## 2018-06-28 HISTORY — DX: Malignant neoplasm of unspecified site of unspecified female breast: C50.919

## 2018-06-29 ENCOUNTER — Ambulatory Visit
Admission: RE | Admit: 2018-06-29 | Discharge: 2018-06-29 | Disposition: A | Discharge status: Home / Self Care | Payer: Medicare Other | Source: Ambulatory Visit | Attending: Radiation Oncology | Admitting: Radiation Oncology

## 2018-06-29 DIAGNOSIS — C50911 Malignant neoplasm of unspecified site of right female breast: Secondary | ICD-10-CM | POA: Diagnosis not present

## 2018-06-30 ENCOUNTER — Ambulatory Visit
Admission: RE | Admit: 2018-06-30 | Discharge: 2018-06-30 | Disposition: A | Discharge status: Home / Self Care | Payer: Medicare Other | Source: Ambulatory Visit | Attending: Radiation Oncology | Admitting: Radiation Oncology

## 2018-06-30 DIAGNOSIS — C50911 Malignant neoplasm of unspecified site of right female breast: Secondary | ICD-10-CM | POA: Diagnosis not present

## 2018-07-01 ENCOUNTER — Other Ambulatory Visit: Payer: Self-pay | Admitting: *Deleted

## 2018-07-01 ENCOUNTER — Ambulatory Visit
Admission: RE | Admit: 2018-07-01 | Discharge: 2018-07-01 | Disposition: A | Discharge status: Home / Self Care | Payer: Medicare Other | Source: Ambulatory Visit | Attending: Radiation Oncology | Admitting: Radiation Oncology

## 2018-07-01 DIAGNOSIS — C50911 Malignant neoplasm of unspecified site of right female breast: Secondary | ICD-10-CM | POA: Diagnosis not present

## 2018-07-05 ENCOUNTER — Ambulatory Visit
Admission: RE | Admit: 2018-07-05 | Discharge: 2018-07-05 | Disposition: A | Discharge status: Home / Self Care | Payer: Medicare Other | Source: Ambulatory Visit | Attending: Radiation Oncology | Admitting: Radiation Oncology

## 2018-07-05 DIAGNOSIS — Z170 Estrogen receptor positive status [ER+]: Secondary | ICD-10-CM | POA: Insufficient documentation

## 2018-07-05 DIAGNOSIS — Z510 Encounter for antineoplastic radiation therapy: Secondary | ICD-10-CM | POA: Diagnosis not present

## 2018-07-05 DIAGNOSIS — C50911 Malignant neoplasm of unspecified site of right female breast: Secondary | ICD-10-CM | POA: Diagnosis not present

## 2018-07-06 ENCOUNTER — Ambulatory Visit
Admission: RE | Admit: 2018-07-06 | Discharge: 2018-07-06 | Disposition: A | Discharge status: Home / Self Care | Payer: Medicare Other | Source: Ambulatory Visit | Attending: Radiation Oncology | Admitting: Radiation Oncology

## 2018-07-06 DIAGNOSIS — C50911 Malignant neoplasm of unspecified site of right female breast: Secondary | ICD-10-CM | POA: Diagnosis not present

## 2018-07-07 ENCOUNTER — Ambulatory Visit
Admission: RE | Admit: 2018-07-07 | Discharge: 2018-07-07 | Disposition: A | Discharge status: Home / Self Care | Payer: Medicare Other | Source: Ambulatory Visit | Attending: Radiation Oncology | Admitting: Radiation Oncology

## 2018-07-07 DIAGNOSIS — C50911 Malignant neoplasm of unspecified site of right female breast: Secondary | ICD-10-CM | POA: Diagnosis not present

## 2018-07-08 ENCOUNTER — Other Ambulatory Visit: Payer: Self-pay | Admitting: *Deleted

## 2018-07-08 ENCOUNTER — Ambulatory Visit
Admission: RE | Admit: 2018-07-08 | Discharge: 2018-07-08 | Disposition: A | Discharge status: Home / Self Care | Payer: Medicare Other | Source: Ambulatory Visit | Attending: Radiation Oncology | Admitting: Radiation Oncology

## 2018-07-08 DIAGNOSIS — C50911 Malignant neoplasm of unspecified site of right female breast: Secondary | ICD-10-CM | POA: Diagnosis not present

## 2018-07-08 MED ORDER — SILVER SULFADIAZINE 1 % EX CREA: 1.0000 "application " | TOPICAL_CREAM | Freq: Two times a day (BID) | CUTANEOUS | 2 refills | Status: DC

## 2018-07-11 ENCOUNTER — Ambulatory Visit
Admission: RE | Admit: 2018-07-11 | Discharge: 2018-07-11 | Disposition: A | Discharge status: Home / Self Care | Payer: Medicare Other | Source: Ambulatory Visit | Attending: Radiation Oncology | Admitting: Radiation Oncology

## 2018-07-11 ENCOUNTER — Other Ambulatory Visit: Payer: Self-pay

## 2018-07-11 ENCOUNTER — Inpatient Hospital Stay: Payer: Medicare Other | Attending: Radiation Oncology

## 2018-07-11 DIAGNOSIS — C50211 Malignant neoplasm of upper-inner quadrant of right female breast: Secondary | ICD-10-CM | POA: Diagnosis present

## 2018-07-11 DIAGNOSIS — C50911 Malignant neoplasm of unspecified site of right female breast: Secondary | ICD-10-CM | POA: Diagnosis not present

## 2018-07-11 DIAGNOSIS — C50411 Malignant neoplasm of upper-outer quadrant of right female breast: Secondary | ICD-10-CM | POA: Insufficient documentation

## 2018-07-11 DIAGNOSIS — Z170 Estrogen receptor positive status [ER+]: Principal | ICD-10-CM

## 2018-07-11 LAB — CBC
HEMATOCRIT: 40.4 % (ref 35.0–47.0)
Hemoglobin: 13.5 g/dL (ref 12.0–16.0)
MCH: 28.5 pg (ref 26.0–34.0)
MCHC: 33.5 g/dL (ref 32.0–36.0)
MCV: 85 fL (ref 80.0–100.0)
PLATELETS: 218 10*3/uL (ref 150–440)
RBC: 4.76 MIL/uL (ref 3.80–5.20)
RDW: 13.9 % (ref 11.5–14.5)
WBC: 6.1 10*3/uL (ref 3.6–11.0)

## 2018-07-12 ENCOUNTER — Ambulatory Visit
Admission: RE | Admit: 2018-07-12 | Discharge: 2018-07-12 | Disposition: A | Discharge status: Home / Self Care | Payer: Medicare Other | Source: Ambulatory Visit | Attending: Radiation Oncology | Admitting: Radiation Oncology

## 2018-07-12 DIAGNOSIS — C50911 Malignant neoplasm of unspecified site of right female breast: Secondary | ICD-10-CM | POA: Diagnosis not present

## 2018-07-13 ENCOUNTER — Ambulatory Visit
Admission: RE | Admit: 2018-07-13 | Discharge: 2018-07-13 | Disposition: A | Discharge status: Home / Self Care | Payer: Medicare Other | Source: Ambulatory Visit | Attending: Radiation Oncology | Admitting: Radiation Oncology

## 2018-07-13 DIAGNOSIS — C50911 Malignant neoplasm of unspecified site of right female breast: Secondary | ICD-10-CM | POA: Diagnosis not present

## 2018-07-14 ENCOUNTER — Ambulatory Visit
Admission: RE | Admit: 2018-07-14 | Discharge: 2018-07-14 | Disposition: A | Discharge status: Home / Self Care | Payer: Medicare Other | Source: Ambulatory Visit | Attending: Radiation Oncology | Admitting: Radiation Oncology

## 2018-07-14 DIAGNOSIS — C50911 Malignant neoplasm of unspecified site of right female breast: Secondary | ICD-10-CM | POA: Diagnosis not present

## 2018-07-15 ENCOUNTER — Ambulatory Visit
Admission: RE | Admit: 2018-07-15 | Discharge: 2018-07-15 | Disposition: A | Discharge status: Home / Self Care | Payer: Medicare Other | Source: Ambulatory Visit | Attending: Radiation Oncology | Admitting: Radiation Oncology

## 2018-07-15 DIAGNOSIS — C50911 Malignant neoplasm of unspecified site of right female breast: Secondary | ICD-10-CM | POA: Diagnosis not present

## 2018-07-18 ENCOUNTER — Ambulatory Visit
Admission: RE | Admit: 2018-07-18 | Discharge: 2018-07-18 | Disposition: A | Discharge status: Home / Self Care | Payer: Medicare Other | Source: Ambulatory Visit | Attending: Radiation Oncology | Admitting: Radiation Oncology

## 2018-07-18 DIAGNOSIS — C50911 Malignant neoplasm of unspecified site of right female breast: Secondary | ICD-10-CM | POA: Diagnosis not present

## 2018-07-19 ENCOUNTER — Ambulatory Visit
Admission: RE | Admit: 2018-07-19 | Discharge: 2018-07-19 | Disposition: A | Discharge status: Home / Self Care | Payer: Medicare Other | Source: Ambulatory Visit | Attending: Radiation Oncology | Admitting: Radiation Oncology

## 2018-07-19 DIAGNOSIS — C50911 Malignant neoplasm of unspecified site of right female breast: Secondary | ICD-10-CM | POA: Diagnosis not present

## 2018-07-20 ENCOUNTER — Ambulatory Visit
Admission: RE | Admit: 2018-07-20 | Discharge: 2018-07-20 | Disposition: A | Discharge status: Home / Self Care | Payer: Medicare Other | Source: Ambulatory Visit | Attending: Radiation Oncology | Admitting: Radiation Oncology

## 2018-07-20 DIAGNOSIS — C50911 Malignant neoplasm of unspecified site of right female breast: Secondary | ICD-10-CM | POA: Diagnosis not present

## 2018-07-21 ENCOUNTER — Ambulatory Visit: Payer: Medicare Other

## 2018-07-22 ENCOUNTER — Ambulatory Visit: Payer: Medicare Other

## 2018-07-25 ENCOUNTER — Ambulatory Visit: Payer: Medicare Other

## 2018-07-25 ENCOUNTER — Emergency Department
Admission: EM | Admit: 2018-07-25 | Discharge: 2018-07-25 | Disposition: A | Discharge status: Home / Self Care | Payer: Medicare Other | Attending: Emergency Medicine | Admitting: Emergency Medicine

## 2018-07-25 ENCOUNTER — Other Ambulatory Visit: Payer: Self-pay

## 2018-07-25 ENCOUNTER — Encounter: Payer: Self-pay | Admitting: Emergency Medicine

## 2018-07-25 DIAGNOSIS — Z923 Personal history of irradiation: Secondary | ICD-10-CM | POA: Diagnosis not present

## 2018-07-25 DIAGNOSIS — Z046 Encounter for general psychiatric examination, requested by authority: Secondary | ICD-10-CM | POA: Diagnosis present

## 2018-07-25 DIAGNOSIS — F323 Major depressive disorder, single episode, severe with psychotic features: Secondary | ICD-10-CM | POA: Diagnosis not present

## 2018-07-25 DIAGNOSIS — F331 Major depressive disorder, recurrent, moderate: Secondary | ICD-10-CM | POA: Diagnosis not present

## 2018-07-25 DIAGNOSIS — Z853 Personal history of malignant neoplasm of breast: Secondary | ICD-10-CM | POA: Insufficient documentation

## 2018-07-25 LAB — TROPONIN I

## 2018-07-25 LAB — COMPREHENSIVE METABOLIC PANEL
ALT: 24 U/L (ref 0–44)
AST: 26 U/L (ref 15–41)
Albumin: 4.3 g/dL (ref 3.5–5.0)
Alkaline Phosphatase: 38 U/L (ref 38–126)
Anion gap: 10 (ref 5–15)
BUN: 8 mg/dL (ref 8–23)
CHLORIDE: 97 mmol/L — AB (ref 98–111)
CO2: 27 mmol/L (ref 22–32)
CREATININE: 0.73 mg/dL (ref 0.44–1.00)
Calcium: 9.5 mg/dL (ref 8.9–10.3)
Glucose, Bld: 102 mg/dL — ABNORMAL HIGH (ref 70–99)
POTASSIUM: 3.4 mmol/L — AB (ref 3.5–5.1)
SODIUM: 134 mmol/L — AB (ref 135–145)
Total Bilirubin: 0.4 mg/dL (ref 0.3–1.2)
Total Protein: 7.2 g/dL (ref 6.5–8.1)

## 2018-07-25 LAB — CBC WITH DIFFERENTIAL/PLATELET
BASOS PCT: 1 %
Basophils Absolute: 0.1 10*3/uL (ref 0–0.1)
EOS ABS: 0.1 10*3/uL (ref 0–0.7)
EOS PCT: 2 %
HEMATOCRIT: 42.8 % (ref 35.0–47.0)
Hemoglobin: 14.6 g/dL (ref 12.0–16.0)
Lymphocytes Relative: 17 %
Lymphs Abs: 0.8 10*3/uL — ABNORMAL LOW (ref 1.0–3.6)
MCH: 29 pg (ref 26.0–34.0)
MCHC: 34.1 g/dL (ref 32.0–36.0)
MCV: 85.1 fL (ref 80.0–100.0)
MONOS PCT: 14 %
Monocytes Absolute: 0.6 10*3/uL (ref 0.2–0.9)
Neutro Abs: 2.8 10*3/uL (ref 1.4–6.5)
Neutrophils Relative %: 66 %
PLATELETS: 209 10*3/uL (ref 150–440)
RBC: 5.03 MIL/uL (ref 3.80–5.20)
RDW: 14.3 % (ref 11.5–14.5)
WBC: 4.3 10*3/uL (ref 3.6–11.0)

## 2018-07-25 LAB — URINALYSIS, COMPLETE (UACMP) WITH MICROSCOPIC
BACTERIA UA: NONE SEEN
BILIRUBIN URINE: NEGATIVE
GLUCOSE, UA: NEGATIVE mg/dL
Hgb urine dipstick: NEGATIVE
KETONES UR: NEGATIVE mg/dL
NITRITE: NEGATIVE
PROTEIN: NEGATIVE mg/dL
Specific Gravity, Urine: 1.017 (ref 1.005–1.030)
pH: 6 (ref 5.0–8.0)

## 2018-07-25 LAB — TSH: TSH: 2.543 u[IU]/mL (ref 0.350–4.500)

## 2018-07-25 LAB — GLUCOSE, CAPILLARY: Glucose-Capillary: 89 mg/dL (ref 70–99)

## 2018-07-25 MED ORDER — RISPERIDONE 1 MG PO TABS: 1.0000 mg | ORAL_TABLET | Freq: Every day | ORAL | 0 refills | Status: DC

## 2018-07-25 MED ORDER — DIAZEPAM 5 MG PO TABS
5.0000 mg | ORAL_TABLET | Freq: Once | ORAL | Status: AC
  Administered 2018-07-25: 5 mg via ORAL
  Filled 2018-07-25: qty 1

## 2018-07-25 MED ORDER — FLUOXETINE HCL 20 MG PO CAPS: 20.0000 mg | ORAL_CAPSULE | Freq: Every day | ORAL | 0 refills | Status: DC

## 2018-07-25 MED ORDER — CEPHALEXIN 250 MG PO CAPS: 250.0000 mg | ORAL_CAPSULE | Freq: Four times a day (QID) | ORAL | 0 refills | Status: DC

## 2018-07-25 MED ORDER — CEPHALEXIN 250 MG PO CAPS: 250.0000 mg | ORAL_CAPSULE | Freq: Four times a day (QID) | ORAL | 0 refills | Status: AC

## 2018-07-25 MED ORDER — BUPROPION HCL ER (XL) 300 MG PO TB24: 300.0000 mg | ORAL_TABLET | Freq: Every day | ORAL | 0 refills | Status: DC

## 2018-07-25 MED ORDER — CIPROFLOXACIN HCL 500 MG PO TABS
500.0000 mg | ORAL_TABLET | Freq: Once | ORAL | Status: AC
  Administered 2018-07-25: 500 mg via ORAL
  Filled 2018-07-25: qty 1

## 2018-07-25 MED ORDER — LORAZEPAM 0.5 MG PO TABS: 0.5000 mg | ORAL_TABLET | Freq: Every day | ORAL | 0 refills | Status: DC | PRN

## 2018-07-25 NOTE — ED Notes (Signed)
TTS is currently at her bedside

## 2018-07-25 NOTE — ED Provider Notes (Signed)
[facility] Emergency Department Provider Note       Time seen: ----------------------------------------- 8:19 AM on 07/25/2018 -----------------------------------------   I have reviewed the triage vital signs and the nursing notes.  HISTORY   Chief Complaint Mental Health Problem    HPI Kendra [name] is a 72 y.o. female with a history of anxiety, depression, psychosis, hypertension who presents to the ED for possible psychotic break.  Patient arrives with husband had run out of her medications about 2 months ago.  She has an appointment to see neurology on Wednesday.  She has required inpatient psychiatric evaluation in the past.  Patient is unable to articulate feelings, spouse states patient is anxious and fearful of everything at this point.  She denies any recent illness, may have had a recent episode of diarrhea.  Past Medical History:  Diagnosis Date  . Anxiety   . Breast cancer ([name]) 03/2018   right breast  . Complication of anesthesia   . Depression   . Family history of breast cancer 03/24/2018  . Family history of lung cancer 03/24/2018  . History of alcohol dependence ([place]) 2007   no ETOH; resolved since 2007  . History of psychosis 2014   due to sleep disturbance  . Hypertension   . Personal history of radiation therapy   . PONV (postoperative nausea and vomiting)     Patient Active Problem List   Diagnosis Date Noted  . Genetic testing 04/07/2018  . Family history of breast cancer 03/24/2018  . Family history of lung cancer 03/24/2018  . Malignant neoplasm of upper-outer quadrant of right breast in female, estrogen receptor positive ([place]) 03/17/2018  . Malignant neoplasm of upper-inner quadrant of right breast in female, estrogen receptor positive ([place]) 03/17/2018  . Breast mass, right 02/07/2018  . History of alcohol dependence ([name]) 02/07/2018  . History of psychosis 02/07/2018  . History of insomnia 02/07/2018    Past  Surgical History:  Procedure Laterality Date  . BREAST LUMPECTOMY WITH RADIOACTIVE SEED AND SENTINEL LYMPH NODE BIOPSY Right 04/27/2018   Procedure: RIGHT BREAST LUMPECTOMY WITH RADIOACTIVE SEED X 2 AND RIGHT SENTINEL LYMPH NODE BIOPSY;  Surgeon: [name], MD;  Location: [place];  Service: General;  Laterality: Right;  . LAPAROTOMY N/A 02/10/2018   Procedure: EXPLORATORY LAPAROTOMY;  Surgeon: Isabel Caprice, MD;  [facility];  Service: Gynecology;  Laterality: N/A;  . MASS EXCISION  01/2018   abdominal  . OVARIAN CYST REMOVAL    . SALPINGOOPHORECTOMY Bilateral 02/10/2018   Procedure: BILATERAL SALPINGO OOPHORECTOMY; PERITONEAL WASHINGS;  Surgeon: Isabel Caprice, MD;  [facility];  Service: Gynecology;  Laterality: Bilateral;  . TONSILLECTOMY    . TONSILLECTOMY AND ADENOIDECTOMY  1953    Allergies Hydroxyzine and Shrimp [shellfish allergy]  Social History Social History   Tobacco Use  . Smoking status: Never Smoker  . Smokeless tobacco: Never Used  Substance Use Topics  . Alcohol use: Not Currently    Frequency: Never    Comment: alcohol dependence prior to 2007  . Drug use: Never   Review of Systems Constitutional: Negative for fever. Eyes: Negative for vision changes ENT:  Negative for congestion, sore throat Cardiovascular: Negative for chest pain. Respiratory: Negative for shortness of breath. Gastrointestinal: Negative for abdominal pain, vomiting and diarrhea. Genitourinary: Negative for dysuria. Musculoskeletal: Negative for back pain. Skin: Negative for rash. Neurological: Negative for headaches, focal weakness or numbness.  All systems negative/normal/unremarkable except as stated in the HPI  ____________________________________________   PHYSICAL EXAM:  VITAL SIGNS: ED Triage Vitals  Enc Vitals Group     BP 07/25/18 0816 128/73     Pulse Rate 07/25/18 0816 83     Resp 07/25/18 0816 16     Temp 07/25/18 0816 98.3 F  (36.8 C)     Temp Source 07/25/18 0816 Oral     SpO2 07/25/18 0816 100 %     Weight 07/25/18 0814 158 lb (71.7 kg)     Height 07/25/18 0814 5' 6.5" (1.689 m)     Head Circumference --      Peak Flow --      Pain Score 07/25/18 0813 0     Pain Loc --      Pain Edu? --      Excl. in [place]? --    Constitutional: Alert and oriented. Well appearing and in no distress. Eyes: Conjunctivae are normal. Normal extraocular movements. Cardiovascular: Normal rate, regular rhythm. No murmurs, rubs, or gallops. Respiratory: Normal respiratory effort without tachypnea nor retractions. Breath sounds are clear and equal bilaterally. No wheezes/rales/rhonchi. Gastrointestinal: Soft and nontender. Normal bowel sounds Musculoskeletal: Nontender with normal range of motion in extremities. No lower extremity tenderness nor edema. Neurologic:  Normal speech and language. No gross focal neurologic deficits are appreciated.  Skin:  Skin is warm, dry and intact. No rash noted. Psychiatric: Depressed mood and affect ____________________________________________  ED COURSE:  As part of my medical decision making, I reviewed the following data within the [facility] History obtained from family if available, nursing notes, old chart and ekg, as well as notes from prior ED visits. Patient presented for depression with psychotic features, we will assess with labs as indicated at this time. Clinical Course as of Jul 25 1049  Mon Jul 25, 2018  1046 Patient does have evidence of UTI, she will be started on Cipro.   [JW]    Clinical Course User Index [JW] [name], MD   Procedures ____________________________________________   LABS (pertinent positives/negatives)  Labs Reviewed  CBC WITH DIFFERENTIAL/PLATELET - Abnormal; Notable for the following components:      Result Value   Lymphs Abs 0.8 (*)    All other components within normal limits  COMPREHENSIVE METABOLIC PANEL - Abnormal;  Notable for the following components:   Sodium 134 (*)    Potassium 3.4 (*)    Chloride 97 (*)    Glucose, Bld 102 (*)    All other components within normal limits  URINALYSIS, COMPLETE (UACMP) WITH MICROSCOPIC - Abnormal; Notable for the following components:   Color, Urine YELLOW (*)    APPearance CLEAR (*)    Leukocytes, UA SMALL (*)    All other components within normal limits  TROPONIN I  GLUCOSE, CAPILLARY  CBG MONITORING, ED  ____________________________________________  DIFFERENTIAL DIAGNOSIS   Dehydration, electrolyte abnormality, occult infection, depression, hypothyroidism, acute psychosis  FINAL ASSESSMENT AND PLAN  Psychotic depression   Plan: The patient had presented for depression with psychotic features. Patient's labs did reveal UTI with no other acute process identified.  She was started on Cipro and I did give her 1 dose of Valium here.  She is medically clear for psychiatric evaluation and disposition.   Laurence Aly, MD   Note: This note was generated in part or whole with voice recognition software. Voice recognition is usually quite accurate but there are transcription errors that can and very often do occur. I apologize for any typographical errors  that were not detected and corrected.     [name], MD 07/25/18 1051

## 2018-07-25 NOTE — ED Notes (Signed)

## 2018-07-25 NOTE — Consult Note (Signed)
[facility] Consult   Reason for Consult: Consult for this 72 year old woman with a past history of treatment for depression who comes to the emergency room with acute worsening of symptoms Referring Physician: Jimmye Norman Patient Identification: Kendra [name] MRN:  [id number] Principal Diagnosis: Moderate recurrent major depression ([place]) Diagnosis:   Patient Active Problem List   Diagnosis Date Noted  . Moderate recurrent major depression ([place]) [F33.1] 07/25/2018  . Genetic testing [Z13.79] 04/07/2018  . Family history of breast cancer [Z80.3] 03/24/2018  . Family history of lung cancer [Z80.1] 03/24/2018  . Malignant neoplasm of upper-outer quadrant of right breast in female, estrogen receptor positive ([place]) [C50.411, Z17.0] 03/17/2018  . Malignant neoplasm of upper-inner quadrant of right breast in female, estrogen receptor positive ([place]) [C50.211, Z17.0] 03/17/2018  . Breast mass, right [N63.10] 02/07/2018  . History of alcohol dependence ([place]) [F10.21] 02/07/2018  . History of psychosis [Z86.59] 02/07/2018  . History of insomnia [Z87.898] 02/07/2018    Total Time spent with patient: 1 hour  Subjective:   Kendra [name] is a 72 y.o. female patient admitted with "I do not know how to describe it".  HPI: Patient seen chart reviewed.  Fortunately the patient's husband was here with her and was able to give the majority of the specific history.  This is a 72 year old woman who comes in with a report that over the last several days she has had a worsening of "feelings and thoughts that are not normal".  Patient has a hard time describing it.  Husband provides one example being that the patient will say that it feels like she is seeing multiple different things at the same time.  It sounds like there is an element of derealization to it as well.  Also admits there is been some hallucinations auditorily but she cannot tell what the content is.  Mood has been more nervous down and  worried.  Seems frightened by things but cannot really articulate what they are.  Sleeping worse than usual and particularly slept quite poorly last night.  Patient and husband both say that the symptoms she is having currently are very similar to what she experienced 2 years ago when she had an episode of severe mental illness while living in [place].  Patient had been continuing to take medication under the guidance of her doctor in [place] but had gradually tapered off things and has now been off of all of the psychiatric medicine for a couple of months.  She does have chronic stresses from medical problems specifically breast cancer.  Denies any suicidal or homicidal thought.  No recent alcohol or drug use.  Medical history: Patient is being treated here for breast cancer.  Seems to be status post treatment and is currently not on anything except some medicine for high blood pressure.  Substance abuse history: Not using alcohol or drugs no history of abuse.  Social history: Patient is married lives with her husband.  They have been here in [place] for over a year now but previously had been living in [place].  Past Psychiatric History: 2 years ago apparently she had symptoms that began has something similar to what she is having now but gradually worsened and possibly at one point may have involved suicidal ideation.  She was hospitalized for approximately a week or so at a hospital there and it sounds like she was probably diagnosed with a psychotic depression.  Medications included Wellbutrin, some sort of benzodiazepine whether it was Valium or Ativan and  risperidone.  From what I can piece together it sounds like the psychiatrist had gradually tapered her off of the risperidone but had been continuing the antidepressant medicine until recently the patient finally discontinued that.  It does not sound like there is any history of actual suicide attempts or violence.  No reported history of  mania or mental health symptoms prior to a couple years ago.  Risk to Self:   Risk to Others:   Prior Inpatient Therapy:   Prior Outpatient Therapy:    Past Medical History:  Past Medical History:  Diagnosis Date  . Anxiety   . Breast cancer ([place]) 03/2018   right breast  . Complication of anesthesia   . Depression   . Family history of breast cancer 03/24/2018  . Family history of lung cancer 03/24/2018  . History of alcohol dependence ([place]) 2007   no ETOH; resolved since 2007  . History of psychosis 2014   due to sleep disturbance  . Hypertension   . Personal history of radiation therapy   . PONV (postoperative nausea and vomiting)     Past Surgical History:  Procedure Laterality Date  . BREAST LUMPECTOMY WITH RADIOACTIVE SEED AND SENTINEL LYMPH NODE BIOPSY Right 04/27/2018   Procedure: RIGHT BREAST LUMPECTOMY WITH RADIOACTIVE SEED X 2 AND RIGHT SENTINEL LYMPH NODE BIOPSY;  Surgeon: [name], MD;  Location: [facility];  Service: General;  Laterality: Right;  . LAPAROTOMY N/A 02/10/2018   Procedure: EXPLORATORY LAPAROTOMY;  Surgeon: Isabel Caprice, MD;  Location: [facility];  Service: Gynecology;  Laterality: N/A;  . MASS EXCISION  01/2018   abdominal  . OVARIAN CYST REMOVAL    . SALPINGOOPHORECTOMY Bilateral 02/10/2018   Procedure: BILATERAL SALPINGO OOPHORECTOMY; PERITONEAL WASHINGS;  Surgeon: Isabel Caprice, MD;  Location: [facility];  Service: Gynecology;  Laterality: Bilateral;  . TONSILLECTOMY    . TONSILLECTOMY AND ADENOIDECTOMY  1953   Family History:  Family History  Problem Relation Age of Onset  . Diabetes Mother   . Breast cancer Mother 73  . Hypertension Mother   . Cancer Mother   . Lung cancer Father        asbestos exposure  . Cancer Father   . Heart disease Brother 68  . Breast cancer Cousin 57       paternal cousin  . Cancer Cousin   . Heart disease Paternal Grandmother 52  . Heart disease Paternal Grandfather    Family  Psychiatric  History: None known Social History:  Social History   Substance and Sexual Activity  Alcohol Use Not Currently  . Frequency: Never   Comment: alcohol dependence prior to 2007     Social History   Substance and Sexual Activity  Drug Use Never    Social History   Socioeconomic History  . Marital status: Married    Spouse name: Not on file  . Number of children: Not on file  . Years of education: Not on file  . Highest education level: Not on file  Occupational History  . Not on file  Social Needs  . Financial resource strain: Not on file  . Food insecurity:    Worry: Not on file    Inability: Not on file  . Transportation needs:    Medical: Not on file    Non-medical: Not on file  Tobacco Use  . Smoking status: Never Smoker  . Smokeless tobacco: Never Used  Substance and Sexual Activity  . Alcohol use: Not Currently  Frequency: Never    Comment: alcohol dependence prior to 2007  . Drug use: Never  . Sexual activity: Yes  Lifestyle  . Physical activity:    Days per week: Not on file    Minutes per session: Not on file  . Stress: Not on file  Relationships  . Social connections:    Talks on phone: Not on file    Gets together: Not on file    Attends religious service: Not on file    Active member of club or organization: Not on file    Attends meetings of clubs or organizations: Not on file    Relationship status: Not on file  Other Topics Concern  . Not on file  Social History Narrative  . Not on file   Additional Social History:    Allergies:   Allergies  Allergen Reactions  . Hydroxyzine Anaphylaxis    Tongue swollen  . Shrimp [Shellfish Allergy] Other (See Comments)    Food poisoning     Labs:  Results for orders placed or performed during the hospital encounter of 07/25/18 (from the past 48 hour(s))  Glucose, capillary     Status: None   Collection Time: 07/25/18  9:15 AM  Result Value Ref Range   Glucose-Capillary 89 70 - 99  mg/dL  CBC with Differential     Status: Abnormal   Collection Time: 07/25/18  9:19 AM  Result Value Ref Range   WBC 4.3 3.6 - 11.0 K/uL   RBC 5.03 3.80 - 5.20 MIL/uL   Hemoglobin 14.6 12.0 - 16.0 g/dL   HCT 42.8 35.0 - 47.0 %   MCV 85.1 80.0 - 100.0 fL   MCH 29.0 26.0 - 34.0 pg   MCHC 34.1 32.0 - 36.0 g/dL   RDW 14.3 11.5 - 14.5 %   Platelets 209 150 - 440 K/uL   Neutrophils Relative % 66 %   Neutro Abs 2.8 1.4 - 6.5 K/uL   Lymphocytes Relative 17 %   Lymphs Abs 0.8 (L) 1.0 - 3.6 K/uL   Monocytes Relative 14 %   Monocytes Absolute 0.6 0.2 - 0.9 K/uL   Eosinophils Relative 2 %   Eosinophils Absolute 0.1 0 - 0.7 K/uL   Basophils Relative 1 %   Basophils Absolute 0.1 0 - 0.1 K/uL    Comment: Performed at [facility], [address] [place], [place] [address]  Comprehensive metabolic panel     Status: Abnormal   Collection Time: 07/25/18  9:19 AM  Result Value Ref Range   Sodium 134 (L) 135 - 145 mmol/L   Potassium 3.4 (L) 3.5 - 5.1 mmol/L   Chloride 97 (L) 98 - 111 mmol/L   CO2 27 22 - 32 mmol/L   Glucose, Bld 102 (H) 70 - 99 mg/dL   BUN 8 8 - 23 mg/dL   Creatinine, Ser 0.73 0.44 - 1.00 mg/dL   Calcium 9.5 8.9 - 10.3 mg/dL   Total Protein 7.2 6.5 - 8.1 g/dL   Albumin 4.3 3.5 - 5.0 g/dL   AST 26 15 - 41 U/L   ALT 24 0 - 44 U/L   Alkaline Phosphatase 38 38 - 126 U/L   Total Bilirubin 0.4 0.3 - 1.2 mg/dL   GFR calc non Af Amer >60 >60 mL/min   GFR calc Af Amer >60 >60 mL/min    Comment: (NOTE) The eGFR has been calculated using the CKD EPI equation. This calculation has not been validated in all clinical situations.  eGFR's persistently <60 mL/min signify possible Chronic Kidney Disease.    Anion gap 10 5 - 15    Comment: Performed at [facility], [address] [place], [place] [address]  Urinalysis, Complete w Microscopic     Status: Abnormal   Collection Time: 07/25/18  9:19 AM  Result Value Ref Range   Color, Urine YELLOW (A) YELLOW    APPearance CLEAR (A) CLEAR   Specific Gravity, Urine 1.017 1.005 - 1.030   pH 6.0 5.0 - 8.0   Glucose, UA NEGATIVE NEGATIVE mg/dL   Hgb urine dipstick NEGATIVE NEGATIVE   Bilirubin Urine NEGATIVE NEGATIVE   Ketones, ur NEGATIVE NEGATIVE mg/dL   Protein, ur NEGATIVE NEGATIVE mg/dL   Nitrite NEGATIVE NEGATIVE   Leukocytes, UA SMALL (A) NEGATIVE   RBC / HPF 0-5 0 - 5 RBC/hpf   WBC, UA 11-20 0 - 5 WBC/hpf   Bacteria, UA NONE SEEN NONE SEEN   Squamous Epithelial / LPF 0-5 0 - 5   Mucus PRESENT     Comment: Performed at [facility], [address] [place], [place] [address]  Troponin I     Status: None   Collection Time: 07/25/18  9:19 AM  Result Value Ref Range   Troponin I <0.03 <0.03 ng/mL    Comment: Performed at [facility], [address] [place], [place] [address]  TSH     Status: None   Collection Time: 07/25/18  9:19 AM  Result Value Ref Range   TSH 2.543 0.350 - 4.500 uIU/mL    Comment: Performed by a 3rd Generation assay with a functional sensitivity of <=0.01 uIU/mL. Performed at [facility], [address] [place],  [address]     No current facility-administered medications for this encounter.    Current Outpatient Medications  Medication Sig Dispense Refill  . buPROPion (WELLBUTRIN XL) 300 MG 24 hr tablet Take 1 tablet (300 mg total) by mouth daily. 30 tablet 0  . cephALEXin (KEFLEX) 250 MG capsule Take 1 capsule (250 mg total) by mouth 4 (four) times daily for 10 days. 28 capsule 0  . docusate sodium (COLACE) 100 MG capsule Take 100 mg by mouth as needed for mild constipation.    Marland Kitchen FLUoxetine (PROZAC) 20 MG capsule Take 1 capsule (20 mg total) by mouth daily. 30 capsule 0  . lisinopril-hydrochlorothiazide (PRINZIDE,ZESTORETIC) 20-12.5 MG tablet Take 1 tablet by mouth daily.    Marland Kitchen LORazepam (ATIVAN) 0.5 MG tablet Take 1 tablet (0.5 mg total) by mouth daily as needed for anxiety. 30 tablet 0  . OVER THE COUNTER MEDICATION Take 2  each by mouth at bedtime. [employer] GABA sleep med at bedtime for sleep per pt.     . risperiDONE (RISPERDAL) 1 MG tablet Take 1 tablet (1 mg total) by mouth at bedtime. 30 tablet 0  . silver sulfADIAZINE (SILVADENE) 1 % cream Apply 1 application topically 2 (two) times daily. 50 g 2    Musculoskeletal: Strength & Muscle Tone: within normal limits Gait & Station: normal Patient leans: N/A  Psychiatric Specialty Exam: Physical Exam  Nursing note and vitals reviewed. Constitutional: She appears well-developed and well-nourished.  HENT:  Head: Normocephalic and atraumatic.  Eyes: Pupils are equal, round, and reactive to light. Conjunctivae are normal.  Neck: Normal range of motion.  Cardiovascular: Regular rhythm and normal heart sounds.  Respiratory: Effort normal. No respiratory distress.  GI: Soft.  Musculoskeletal: Normal range of motion.  Neurological: She is alert.  Skin: Skin is warm and dry.  Psychiatric: Judgment normal. Her mood appears anxious. Her affect is blunt. Her speech is delayed and tangential. She is slowed. She is not agitated. Thought content is not paranoid. Cognition and memory are impaired. She expresses no homicidal and no suicidal ideation. She exhibits abnormal recent memory.    Review of Systems  Constitutional: Negative.   HENT: Negative.   Eyes: Negative.   Respiratory: Negative.   Cardiovascular: Negative.   Gastrointestinal: Negative.   Musculoskeletal: Negative.   Skin: Negative.   Neurological: Negative.   Psychiatric/Behavioral: Positive for depression. Negative for hallucinations, memory loss, substance abuse and suicidal ideas. The patient is nervous/anxious and has insomnia.     Blood pressure 128/73, pulse 83, temperature 98.3 F (36.8 C), temperature source Oral, resp. rate 17, height 5' 6.5" (1.689 m), weight 71.7 kg, SpO2 99 %.Body mass index is 25.12 kg/m.  General Appearance: Casual  Eye Contact:  Minimal  Speech:  Slow  Volume:   Decreased  Mood:  Anxious and Dysphoric  Affect:  Flat  Thought Process:  Disorganized  Orientation:  Full (Time, Place, and Person)  Thought Content:  Illogical  Suicidal Thoughts:  No  Homicidal Thoughts:  No  Memory:  Immediate;   Fair Recent;   Fair Remote;   Fair  Judgement:  Fair  Insight:  Fair  Psychomotor Activity:  Decreased  Concentration:  Concentration: Fair  Recall:  [employer] of Knowledge:  Fair  Language:  Fair  Akathisia:  No  Handed:  Right  AIMS (if indicated):     Assets:  Desire for Improvement Housing Resilience Social Support  ADL's:  Intact  Cognition:  Impaired,  Mild  Sleep:        Treatment Plan Summary: Medication management and Plan 72 year old woman who has a past history of psychotic depression.  Discontinued medication a couple months ago.  Now showing symptoms that by the husband and the patient's report are similar to what she had a couple years ago.  Patient is showing good insight and is agreeable to treatment.  I asked her and her husband several times if they were comfortable and felt safe with managing this as an outpatient for now and they both said that they did.  An appointment is already in place for them to see Dr. Manuella Ghazi the neurologist later this week.  I have suggested that we restart her medication including the Wellbutrin the low-dose lorazepam as needed and the risperidone at a dose of just 1 mg at night.  Side effects reviewed.  Patient and husband agreeable.  Prescriptions were written and the patient can be released from the emergency room with an understanding that they can always return if symptoms worsen or is there any concern about dangerousness.  Strongly supported and following up with appointment with Dr. Manuella Ghazi this week.  Disposition: No evidence of imminent risk to self or others at present.   Patient does not meet criteria for psychiatric inpatient admission. Supportive therapy provided about ongoing  stressors. Discussed crisis plan, support from social network, calling 911, coming to the Emergency Department, and calling Suicide Hotline.  Alethia Berthold, MD 07/25/2018 8:26 PM

## 2018-07-25 NOTE — ED Provider Notes (Signed)
Patient was evaluated by psychiatry and has been cleared for outpatient follow up. Dr. Weber Cooks has refilled her medication, we will treat her UTI with keflex.   Kendra Newport, MD 07/25/18 1314

## 2018-07-25 NOTE — ED Notes (Signed)
Consultant [facility] is currently at her bedside

## 2018-07-25 NOTE — ED Notes (Signed)
Spouse to cafeteria  - graham crackers, pb and apple juice provided to pt  NAD assessed  Psych consult pending  Continue to monitor

## 2018-07-25 NOTE — ED Notes (Signed)
BEHAVIORAL HEALTH ROUNDING Patient sleeping: No. Patient alert and oriented: yes Behavior appropriate: Yes.  ; If no, describe:  Nutrition and fluids offered: yes Toileting and hygiene offered: Yes  Sitter present: q15 minute observations and security  monitoring Law enforcement present: Yes  ODS  

## 2018-07-25 NOTE — ED Triage Notes (Signed)
Arrives with husband who states patient is experiencing symptoms of a psychotic break.  Has had previous episodes and had been started on medications.  Medications ran out, so patient has been off meds for 2 months.  Has an appointment with Dr. Manuella Ghazi on Wednesday.  Denies SI'HI.  Patient unable to articulate feelings.  Spouse states patient is anxious and fearful of everything.

## 2018-07-26 ENCOUNTER — Ambulatory Visit: Payer: Medicare Other

## 2018-07-27 ENCOUNTER — Ambulatory Visit: Payer: Medicare Other

## 2018-07-28 ENCOUNTER — Ambulatory Visit: Payer: Medicare Other

## 2018-07-28 ENCOUNTER — Other Ambulatory Visit (HOSPITAL_COMMUNITY): Payer: Self-pay | Admitting: Neurology

## 2018-07-28 ENCOUNTER — Encounter: Payer: Self-pay | Admitting: *Deleted

## 2018-07-28 DIAGNOSIS — R413 Other amnesia: Secondary | ICD-10-CM

## 2018-07-29 ENCOUNTER — Ambulatory Visit: Payer: Medicare Other

## 2018-08-01 ENCOUNTER — Ambulatory Visit: Payer: Medicare Other

## 2018-08-02 ENCOUNTER — Ambulatory Visit: Payer: Medicare Other

## 2018-08-03 ENCOUNTER — Ambulatory Visit: Payer: Medicare Other

## 2018-08-04 ENCOUNTER — Ambulatory Visit: Payer: Medicare Other

## 2018-08-05 ENCOUNTER — Inpatient Hospital Stay: Payer: Medicare Other | Attending: Hematology and Oncology | Admitting: Hematology and Oncology

## 2018-08-05 ENCOUNTER — Ambulatory Visit: Payer: Medicare Other

## 2018-08-05 ENCOUNTER — Other Ambulatory Visit: Payer: Self-pay

## 2018-08-05 VITALS — BP 120/75 | HR 83 | Temp 97.5°F | Resp 18 | Wt 157.7 lb

## 2018-08-05 DIAGNOSIS — C50911 Malignant neoplasm of unspecified site of right female breast: Secondary | ICD-10-CM | POA: Diagnosis not present

## 2018-08-05 DIAGNOSIS — Z170 Estrogen receptor positive status [ER+]: Secondary | ICD-10-CM

## 2018-08-05 DIAGNOSIS — C50411 Malignant neoplasm of upper-outer quadrant of right female breast: Secondary | ICD-10-CM

## 2018-08-05 DIAGNOSIS — F23 Brief psychotic disorder: Secondary | ICD-10-CM | POA: Diagnosis not present

## 2018-08-05 NOTE — Progress Notes (Signed)
Here today for follow up . Per husband more confusion noted and Dr Manuella Ghazi ordered MRI for Monday oct 7th. Husband helping orient pt. Gowned.

## 2018-08-05 NOTE — Progress Notes (Signed)
[facility] day:  08/05/2018  Chief Complaint: Kendra [name] is a 72 y.o. female with multifocal stage IA right breast cancer who is seen 1 month assessment follow radiation.  HPI: Patient was last seen in the medical oncology clinic on 06/03/2018.  At that time, patient was doing well overall following 2 recent surgeries.  She noted a decreased energy level.  Denied pain.  Exam was grossly unremarkable.  Patient was seen in consult on 06/03/2018 by Dr. Noreene Filbert (radiation oncology). She was deemed to be an appropriate candidate for treatment with radiation. Plans were for hypo-fractionated  radiation therapy course, delivering 5040 cGy in 28 fractions.  Dr. Donella Stade noted that based on the close margins, he would plan on delivering another 1600 cGy using electron beam scar boost.  Routine bone density was done on 06/28/2018 that revealed a T score of -0.8 in the right femoral neck.  Patient underwent CT simulation for radiation therapy on 06/29/2018.  She began her treatments on 06/30/2018 and completed on 07/20/2018.  Patient was seen in the ED on 07/25/2018 by Dr. Lenise Arena.  Notes reviewed.  Patient presented with complaints of experiencing a "psychotic break".  PMH significant for psychiatric conditions including anxiety, depression, and psychosis.  Patient off antipsychotics x2 months.  Lab work-up (+) for UTI.  She was treated with ciprofloxacin and diazepam.  Patient was seen, and ultimately cleared by, psychiatry Weber Cooks, MD).  Wellbutrin, lorazepam, and risperidone prescriptions were refilled.  She was scheduled to follow-up with neurology Manuella Ghazi, MD).  Treatment for UTI continued as an outpatient with cephalexin.  Patient was discharged home with her husband.  She was seen in consult on 07/27/2018 by Dr. Jennings Books (neurology).  Notes reviewed.  Patient felt to have multifactorial cognitive impairment related to her bipolar disorder  and paranoia, history of alcoholism, and being off of her medications.  There was concern for underlying neurodegenerative disorder.  MRI of the brain, in addition to vitamin levels and RPR, was ordered.  Referrals were made for both primary care and local psychiatry.  Patient to follow-up in 3 months or sooner if needed.  Patient is scheduled for an MRI of her brain on 08/08/2018.  Patient presents today saying that she is "confused". She states, "there are so many things going on in my life. There are things going on in my ears. They sort of come and terrify me because I don't know what will happen". Patient has been referred to psychiatry, however no appointment has been made at this juncture. Patient is losing weight. She is not really eating well. Patient states, "it all depends how much food is on the plate. If there is a lot of food on the plate, then I just can't eat it". Weight today is 157 lb 11.2 oz (71.5 kg), which compared to her last visit to the clinic, represents a 13 pound weight loss.   Patient denies that she has experienced any B symptoms. She denies any interval infections. She complains of exertional dyspnea. Patient has constipation. She notes that she will often go 7 days between bowel movements.  Patient does not verbalize any concerns with regards to her breasts today. Patient performs monthly self breast examinations as recommended.   Patient denies pain in the clinic today.   Past Medical History:  Diagnosis Date  . Anxiety   . Breast cancer ([place]) 03/2018   right breast  . Complication of anesthesia   . Depression   .  Family history of breast cancer 03/24/2018  . Family history of lung cancer 03/24/2018  . History of alcohol dependence ([place]) 2007   no ETOH; resolved since 2007  . History of psychosis 2014   due to sleep disturbance  . Hypertension   . Personal history of radiation therapy   . PONV (postoperative nausea and vomiting)     Past Surgical History:   Procedure Laterality Date  . BREAST LUMPECTOMY WITH RADIOACTIVE SEED AND SENTINEL LYMPH NODE BIOPSY Right 04/27/2018   Procedure: RIGHT BREAST LUMPECTOMY WITH RADIOACTIVE SEED X 2 AND RIGHT SENTINEL LYMPH NODE BIOPSY;  Surgeon: [name], MD;  Location: [facility];  Service: General;  Laterality: Right;  . LAPAROTOMY N/A 02/10/2018   Procedure: EXPLORATORY LAPAROTOMY;  Surgeon: Isabel Caprice, MD;  Location: [facility];  Service: Gynecology;  Laterality: N/A;  . MASS EXCISION  01/2018   abdominal  . OVARIAN CYST REMOVAL    . SALPINGOOPHORECTOMY Bilateral 02/10/2018   Procedure: BILATERAL SALPINGO OOPHORECTOMY; PERITONEAL WASHINGS;  Surgeon: Isabel Caprice, MD;  Location: [facility];  Service: Gynecology;  Laterality: Bilateral;  . TONSILLECTOMY    . TONSILLECTOMY AND ADENOIDECTOMY  1953    Family History  Problem Relation Age of Onset  . Diabetes Mother   . Breast cancer Mother 25  . Hypertension Mother   . Cancer Mother   . Lung cancer Father        asbestos exposure  . Cancer Father   . Heart disease Brother 73  . Breast cancer Cousin 75       paternal cousin  . Cancer Cousin   . Heart disease Paternal Grandmother 18  . Heart disease Paternal Grandfather     Social History:  reports that she has never smoked. She has never used smokeless tobacco. She reports that she drank alcohol. She reports that she does not use drugs.  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 She lives in [place].  The patient is accompanied by he patient accompanied by her husband, [name], today.  Allergies:  Allergies  Allergen Reactions  . Hydroxyzine Anaphylaxis    Tongue swollen  . Shrimp [Shellfish Allergy] Other (See Comments)    Food poisoning     Current Medications: Current Outpatient Medications  Medication Sig Dispense Refill  . buPROPion (WELLBUTRIN XL) 300 MG 24 hr tablet Take 1 tablet (300 mg total) by mouth daily. 30 tablet 0  . clonazePAM  (KLONOPIN) 0.5 MG tablet Take 0.5 mg by mouth 2 (two) times daily.     Marland Kitchen FLUoxetine (PROZAC) 20 MG capsule Take 1 capsule (20 mg total) by mouth daily. 30 capsule 0  . lisinopril-hydrochlorothiazide (PRINZIDE,ZESTORETIC) 20-12.5 MG tablet Take 1 tablet by mouth daily.    . risperiDONE (RISPERDAL) 1 MG tablet Take 1 tablet (1 mg total) by mouth at bedtime. 30 tablet 0  . docusate sodium (COLACE) 100 MG capsule Take 100 mg by mouth as needed for mild constipation.    Marland Kitchen LORazepam (ATIVAN) 0.5 MG tablet Take 1 tablet (0.5 mg total) by mouth daily as needed for anxiety. (Patient not taking: Reported on 08/05/2018) 30 tablet 0  . OVER THE COUNTER MEDICATION Take 2 each by mouth at bedtime. OTC [employer]arma GABA sleep med at bedtime for sleep per pt.     . silver sulfADIAZINE (SILVADENE) 1 % cream Apply 1 application topically 2 (two) times daily. (Patient not taking: Reported on 08/05/2018) 50 g 2   No current facility-administered  medications for this visit.      Review of Systems  Constitutional: Positive for malaise/fatigue and weight loss (down 13 pounds). Negative for diaphoresis and fever.       Frightened  HENT: Negative.   Eyes: Negative.   Respiratory: Positive for shortness of breath (exertional). Negative for cough, hemoptysis and sputum production.   Cardiovascular: Negative for chest pain, palpitations, orthopnea, leg swelling and PND.  Gastrointestinal: Positive for constipation (7 days between bowel movements). Negative for abdominal pain, blood in stool, diarrhea, melena, nausea and vomiting.  Genitourinary: Negative for dysuria, frequency, hematuria and urgency.  Musculoskeletal: Negative for back pain, falls, joint pain and myalgias.  Skin: Negative for itching and rash.       Senile purpura  Neurological: Negative for dizziness, tremors, weakness and headaches.  Endo/Heme/Allergies: Does not bruise/bleed easily.  Psychiatric/Behavioral: Positive for hallucinations (?? AH - "There are  things going on in my ears. They sort of come and terrify me because I don't know what will happen"). Negative for depression, memory loss and suicidal ideas. The patient is not nervous/anxious and does not have insomnia.        Confused  All other systems reviewed and are negative.  Performance status (ECOG): 2 - Symptomatic, <50% confined to bed  Vital Signs BP 120/75 (BP Location: Right Arm, Patient Position: Sitting)   Pulse 83   Temp (!) 97.5 F (36.4 C) (Tympanic)   Resp 18   Wt 157 lb 11.2 oz (71.5 kg)   BMI 25.07 kg/m   Physical Exam  Constitutional: She is oriented to person, place, and time and well-developed, well-nourished, and in no distress.  HENT:  Head: Normocephalic and atraumatic.  Mouth/Throat: Oropharynx is clear and moist and mucous membranes are normal.  Brown hair with blonde highlights  Eyes: Pupils are equal, round, and reactive to light. EOM are normal. No scleral icterus.  Blue eyes  Neck: Normal range of motion. Neck supple. No tracheal deviation present. No thyromegaly present.  Cardiovascular: Normal rate, regular rhythm, normal heart sounds and intact distal pulses. Exam reveals no gallop and no friction rub.  No murmur heard. Pulmonary/Chest: Effort normal and breath sounds normal. No respiratory distress. She has no wheezes. She has no rales. Right breast exhibits skin change (post operative changes at 9 o'clock and post radiation hyperpigmentation). Right breast exhibits no inverted nipple, no mass, no nipple discharge and no tenderness. Left breast exhibits no inverted nipple, no mass, no nipple discharge, no skin change and no tenderness.  Abdominal: Soft. Bowel sounds are normal. She exhibits no distension. There is no tenderness.  Musculoskeletal: Normal range of motion. She exhibits no edema or tenderness.  Lymphadenopathy:    She has no cervical adenopathy.    She has no axillary adenopathy.       Right: No inguinal and no supraclavicular  adenopathy present.       Left: No inguinal and no supraclavicular adenopathy present.  Neurological: She is alert and oriented to person, place, and time.  Skin: Skin is warm and dry. No rash noted. No erythema.  Senile purpura; moles.  Psychiatric: Judgment normal. Her mood appears anxious. She exhibits a depressed mood. She has a flat affect.  ?? AH halucinations  Nursing note and vitals reviewed.   No visits with results within 3 Day(s) from this visit.  Latest known visit with results is:  Admission on 07/25/2018, Discharged on 07/25/2018  Component Date Value Ref Range Status  . WBC 07/25/2018 4.3  3.6 - 11.0 K/uL Final  . RBC 07/25/2018 5.03  3.80 - 5.20 MIL/uL Final  . Hemoglobin 07/25/2018 14.6  12.0 - 16.0 g/dL Final  . HCT 07/25/2018 42.8  35.0 - 47.0 % Final  . MCV 07/25/2018 85.1  80.0 - 100.0 fL Final  . MCH 07/25/2018 29.0  26.0 - 34.0 pg Final  . MCHC 07/25/2018 34.1  32.0 - 36.0 g/dL Final  . RDW 07/25/2018 14.3  11.5 - 14.5 % Final  . Platelets 07/25/2018 209  150 - 440 K/uL Final  . Neutrophils Relative % 07/25/2018 66  % Final  . Neutro Abs 07/25/2018 2.8  1.4 - 6.5 K/uL Final  . Lymphocytes Relative 07/25/2018 17  % Final  . Lymphs Abs 07/25/2018 0.8* 1.0 - 3.6 K/uL Final  . Monocytes Relative 07/25/2018 14  % Final  . Monocytes Absolute 07/25/2018 0.6  0.2 - 0.9 K/uL Final  . Eosinophils Relative 07/25/2018 2  % Final  . Eosinophils Absolute 07/25/2018 0.1  0 - 0.7 K/uL Final  . Basophils Relative 07/25/2018 1  % Final  . Basophils Absolute 07/25/2018 0.1  0 - 0.1 K/uL Final   Performed at [facility], [address], [place], [place] [address]  . Sodium 07/25/2018 134* 135 - 145 mmol/L Final  . Potassium 07/25/2018 3.4* 3.5 - 5.1 mmol/L Final  . Chloride 07/25/2018 97* 98 - 111 mmol/L Final  . CO2 07/25/2018 27  22 - 32 mmol/L Final  . Glucose, Bld 07/25/2018 102* 70 - 99 mg/dL Final  . BUN 07/25/2018 8  8 - 23 mg/dL Final  . Creatinine, Ser  07/25/2018 0.73  0.44 - 1.00 mg/dL Final  . Calcium 07/25/2018 9.5  8.9 - 10.3 mg/dL Final  . Total Protein 07/25/2018 7.2  6.5 - 8.1 g/dL Final  . Albumin 07/25/2018 4.3  3.5 - 5.0 g/dL Final  . AST 07/25/2018 26  15 - 41 U/L Final  . ALT 07/25/2018 24  0 - 44 U/L Final  . Alkaline Phosphatase 07/25/2018 38  38 - 126 U/L Final  . Total Bilirubin 07/25/2018 0.4  0.3 - 1.2 mg/dL Final  . GFR calc non Af Amer 07/25/2018 >60  >60 mL/min Final  . GFR calc Af Amer 07/25/2018 >60  >60 mL/min Final   Comment: (NOTE) The eGFR has been calculated using the CKD EPI equation. This calculation has not been validated in all clinical situations. eGFR's persistently <60 mL/min signify possible Chronic Kidney Disease.   Georgiann Hahn gap 07/25/2018 10  5 - 15 Final   Performed at [facility], [address] [place], [place] [address]  . Color, Urine 07/25/2018 YELLOW* YELLOW Final  . APPearance 07/25/2018 CLEAR* CLEAR Final  . Specific Gravity, Urine 07/25/2018 1.017  1.005 - 1.030 Final  . pH 07/25/2018 6.0  5.0 - 8.0 Final  . Glucose, UA 07/25/2018 NEGATIVE  NEGATIVE mg/dL Final  . Hgb urine dipstick 07/25/2018 NEGATIVE  NEGATIVE Final  . Bilirubin Urine 07/25/2018 NEGATIVE  NEGATIVE Final  . Ketones, ur 07/25/2018 NEGATIVE  NEGATIVE mg/dL Final  . Protein, ur 07/25/2018 NEGATIVE  NEGATIVE mg/dL Final  . Nitrite 07/25/2018 NEGATIVE  NEGATIVE Final  . Leukocytes, UA 07/25/2018 SMALL* NEGATIVE Final  . RBC / HPF 07/25/2018 0-5  0 - 5 RBC/hpf Final  . WBC, UA 07/25/2018 11-20  0 - 5 WBC/hpf Final  . Bacteria, UA 07/25/2018 NONE SEEN  NONE SEEN Final  . Squamous Epithelial / LPF 07/25/2018 0-5  0 -  5 Final  . Mucus 07/25/2018 PRESENT   Final   Performed at [facility], [address] [place], [place] [address]  . Troponin I 07/25/2018 <0.03  <0.03 ng/mL Final   Performed at [facility], [address] [place], [place] [address]  . Glucose-Capillary 07/25/2018 89  70 -  99 mg/dL Final  . TSH 07/25/2018 2.543  0.350 - 4.500 uIU/mL Final   Comment: Performed by a 3rd Generation assay with a functional sensitivity of <=0.01 uIU/mL. Performed at [facility], [address] [place], [place] [address]     Assessment:  Kendra [name] is a 72 y.o. female with multi-focal stage IA right breast cancer s/p right lumpectomy on 04/27/2018.  Pathology of the right lateral lesion revealed a 2.5 cm grade II invasive ductal carcinoma with intermediate grade DCIS.  The medial lesion revealed a 1.1 cm grade II invasive ductal carcinoma with intermediate grade DCIS. LVIDS was present.  Margins were negative.  Zero of 2 lymph nodes were positive. Both tumors were ER + (100%), PR + (70%), and HER-2 negative.  Ki-67 was 10%.  Pathologic stage was mT2N0.  Oncotype DX revealed a recurrence score of 12 which translated to a distant recurrence of 3% at 9 years with an AI or tamxifen alone.  The absolute benefit of chemotherpay was < 1%.  Inv[employer]ae testing on 03/23/2018 revealed a variant of uncertain significance in MSH6.   She underwent exploratory laparotomy, bilateral salpingo-oophrectomy, and pelvic washings on 02/10/2018. 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  Peritoneal washings revealed atypical cells.  Bone density on 06/28/2018 that revealed a normal T score of -0.8 in the right femoral neck.  Symptomatically, patient remains fatigued. She presents today following a recent psychotic break. She is frightened. Questionable AH as patient states, "there are things going on in my ears. They sort of come and terrify me because I don't know what will happen". No VH. Patient denies that she has experienced any B symptoms. She denies any interval infections. No breast concerns. Exam is grossly stable.   Plan: 1. Multifocal right breast cancer  Doing well overall.  She denies any breast concerns.  Discussed initiation of adjuvant endocrine therapy  using letrozole x5 years.  Side effects reviewed.  Due to recent changes to patient's psychiatric state, she wishes to defer starting her endocrine therapy at this time.  Patient will follow-up in 1 month to discuss further. 2. Psychiatric issues  Recent psychotic break.  Patient evaluated in the ED by Dr. Weber Cooks.  Outpatient psychiatric evaluation and management recommended.  Patient off of antipsychotic medications for the last 2 to 3 months.  Patient scheduled to be seen by local psychiatrist.  Patient's husband is supportive.  She has questionable auditory hallucinations, however denies visual hallucinations.  No SI or HI.  Patient contracts for safety. 3. Bone density  Bone density study on 06/28/2018 revealed a normal T score of -0.8.  Ten-year fracture probability by FRAX of 3% or greater for hip fracture or 20% or greater for major osteoporotic fracture.  Reviewed potential for bone thinning associated with anticipated endocrine therapy.    With initiation of letrozole, patient will need to began supplemental calcium and vitamin D daily.  4. RTC in 1 month for MD assessment, labs (CBC with differential, CMP), and initiation of letrozole.   Honor Loh, NP  08/05/2018, 3:20 PM   I saw and evaluated the patient, participating in the key portions of the service and  reviewing pertinent diagnostic studies and records.  I reviewed the nurse practitioner's note and agree with the findings and the plan.  The assessment and plan were discussed with the patient.  Several questions were asked by the patient and answered.   Nolon Stalls, MD 08/05/2018,3:20 PM

## 2018-08-05 NOTE — Patient Instructions (Signed)
Letrozole tablets What is this medicine? LETROZOLE (LET roe zole) blocks the production of estrogen. It is used to treat breast cancer. This medicine may be used for other purposes; ask your health care provider or pharmacist if you have questions. COMMON BRAND NAME(S): Femara What should I tell my health care provider before I take this medicine? They need to know if you have any of these conditions: -high cholesterol -liver disease -osteoporosis (weak bones) -an unusual or allergic reaction to letrozole, other medicines, foods, dyes, or preservatives -pregnant or trying to get pregnant -breast-feeding How should I use this medicine? Take this medicine by mouth with a glass of water. You may take it with or without food. Follow the directions on the prescription label. Take your medicine at regular intervals. Do not take your medicine more often than directed. Do not stop taking except on your doctor's advice. Talk to your pediatrician regarding the use of this medicine in children. Special care may be needed. Overdosage: If you think you have taken too much of this medicine contact a poison control center or emergency room at once. NOTE: This medicine is only for you. Do not share this medicine with others. What if I miss a dose? If you miss a dose, take it as soon as you can. If it is almost time for your next dose, take only that dose. Do not take double or extra doses. What may interact with this medicine? Do not take this medicine with any of the following medications: -estrogens, like hormone replacement therapy or birth control pills This medicine may also interact with the following medications: -dietary supplements such as androstenedione or DHEA -prasterone -tamoxifen This list may not describe all possible interactions. Give your health care provider a list of all the medicines, herbs, non-prescription drugs, or dietary supplements you use. Also tell them if you smoke, drink  alcohol, or use illegal drugs. Some items may interact with your medicine. What should I watch for while using this medicine? Tell your doctor or healthcare professional if your symptoms do not start to get better or if they get worse. Do not become pregnant while taking this medicine or for 3 weeks after stopping it. Women should inform their doctor if they wish to become pregnant or think they might be pregnant. There is a potential for serious side effects to an unborn child. Talk to your health care professional or pharmacist for more information. Do not breast-feed while taking this medicine or for 3 weeks after stopping it. This medicine may interfere with the ability to have a child. Talk with your doctor or health care professional if you are concerned about your fertility. Using this medicine for a long time may increase your risk of low bone mass. Talk to your doctor about bone health. You may get drowsy or dizzy. Do not drive, use machinery, or do anything that needs mental alertness until you know how this medicine affects you. Do not stand or sit up quickly, especially if you are an older patient. This reduces the risk of dizzy or fainting spells. You may need blood work done while you are taking this medicine. What side effects may I notice from receiving this medicine? Side effects that you should report to your doctor or health care professional as soon as possible: -allergic reactions like skin rash, itching, or hives -bone fracture -chest pain -signs and symptoms of a blood clot such as breathing problems; changes in vision; chest pain; severe, sudden headache; pain, swelling,   warmth in the leg; trouble speaking; sudden numbness or weakness of the face, arm or leg -vaginal bleeding Side effects that usually do not require medical attention (report to your doctor or health care professional if they continue or are bothersome): -bone, back, joint, or muscle  pain -dizziness -fatigue -fluid retention -headache -hot flashes, night sweats -nausea -weight gain This list may not describe all possible side effects. Call your doctor for medical advice about side effects. You may report side effects to FDA at 1-800-FDA-1088. Where should I keep my medicine? Keep out of the reach of children. Store between 15 and 30 degrees C (59 and 86 degrees F). Throw away any unused medicine after the expiration date. NOTE: This sheet is a summary. It may not cover all possible information. If you have questions about this medicine, talk to your doctor, pharmacist, or health care provider.  2018 Elsevier/Gold Standard (2016-05-25 11:10:41)  

## 2018-08-08 ENCOUNTER — Ambulatory Visit (HOSPITAL_COMMUNITY)
Admission: RE | Admit: 2018-08-08 | Discharge: 2018-08-08 | Disposition: A | Discharge status: Home / Self Care | Payer: Medicare Other | Source: Ambulatory Visit | Attending: Neurology | Admitting: Neurology

## 2018-08-08 DIAGNOSIS — R413 Other amnesia: Secondary | ICD-10-CM

## 2018-08-09 ENCOUNTER — Other Ambulatory Visit: Payer: Self-pay | Admitting: Psychiatry

## 2018-08-13 ENCOUNTER — Encounter: Payer: Self-pay | Admitting: Hematology and Oncology

## 2018-08-16 ENCOUNTER — Other Ambulatory Visit: Payer: Self-pay | Admitting: Psychiatry

## 2018-08-18 ENCOUNTER — Telehealth: Payer: Self-pay

## 2018-08-18 NOTE — Telephone Encounter (Signed)
Patients husband called and said that he called [employer] and he said that [employer] doesn't need a referring doctor and if we accept [employer] they said to go ahead and schedule. Husband [name] said to please call him so we can discuss this in more detail

## 2018-08-23 NOTE — Telephone Encounter (Signed)
Patients husband called back wanting to finish the discussion from last week that was started with you.  Kendra [name] [phone].     Thanks

## 2018-08-24 ENCOUNTER — Ambulatory Visit
Admission: RE | Admit: 2018-08-24 | Discharge: 2018-08-24 | Disposition: A | Discharge status: Home / Self Care | Payer: Medicare Other | Source: Ambulatory Visit | Attending: Radiation Oncology | Admitting: Radiation Oncology

## 2018-08-24 ENCOUNTER — Encounter: Payer: Self-pay | Admitting: *Deleted

## 2018-08-24 ENCOUNTER — Encounter: Payer: Self-pay | Admitting: Radiation Oncology

## 2018-08-24 ENCOUNTER — Other Ambulatory Visit: Payer: Self-pay

## 2018-08-24 VITALS — BP 118/73 | HR 77 | Temp 97.9°F | Resp 16 | Wt 159.4 lb

## 2018-08-24 DIAGNOSIS — Z170 Estrogen receptor positive status [ER+]: Secondary | ICD-10-CM | POA: Diagnosis not present

## 2018-08-24 DIAGNOSIS — C50411 Malignant neoplasm of upper-outer quadrant of right female breast: Secondary | ICD-10-CM | POA: Insufficient documentation

## 2018-08-24 DIAGNOSIS — F0390 Unspecified dementia without behavioral disturbance: Secondary | ICD-10-CM | POA: Diagnosis not present

## 2018-08-24 NOTE — Progress Notes (Signed)
Radiation Oncology Follow up Note  Name: Kendra [name]   Date:   08/24/2018 MRN:  [id number] DOB: [birth year]    This 72 y.o. female presents to the clinic today for one-month follow-up status postwhole breast radiation to her right breast for stage IIa (T2 N0 M0) invasive mammary carcinoma ER/PR positive.  REFERRING PROVIDER: [name], MD  HPI: patient is a 72 year old female with early dementia now out 1 month having completed whole breast radiation to her right breast. She is seen today in routine follow-up is doing fairly well she has seen Dr. Brigitte Pulse who is adjusting her psychiatric medication. They're currently in pursuit of a medical oncologist. 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 I'm referring her back to that surgeon for evaluation.  COMPLICATIONS OF TREATMENT: none  FOLLOW UP COMPLIANCE: keeps appointments   PHYSICAL EXAM:  BP 118/73 (BP Location: Left Arm, Patient Position: Sitting)   Pulse 77   Temp 97.9 F (36.6 C) (Tympanic)   Resp 16   Wt 159 lb 6.3 oz (72.3 kg)   BMI 25.34 kg/m  Lungs are clear to A&P cardiac examination essentially unremarkable with regular rate and rhythm. No dominant mass or nodularity is noted in either breast in 2 positions examined. Incision is well-healed. No axillary or supraclavicular adenopathy is appreciated. Cosmetic result is excellent.still some hyperpigmentation of the skin although incision is well-healed. She does have a palpable hard mass present in the upper left quadrant of her abdomen.Well-developed well-nourished patient in NAD. HEENT reveals PERLA, EOMI, discs not visualized.  Oral cavity is clear. No oral mucosal lesions are identified. Neck is clear without evidence of cervical or supraclavicular adenopathy. Lungs are clear to A&P. Cardiac examination is essentially unremarkable  with regular rate and rhythm without murmur rub or thrill. Abdomen is benign with no organomegaly or masses noted. Motor sensory and DTR levels are equal and symmetric in the upper and lower extremities. Cranial nerves II through XII are grossly intact. Proprioception is intact. No peripheral adenopathy or edema is identified. No motor or sensory levels are noted. Crude visual fields are within normal range.  RADIOLOGY RESULTS: no current films for review  PLAN: at the present time from a breast standpoint patient is doing well she is also under care of Dr. Brigitte Pulse neurology and is being referred for psychiatric evaluation and consultation. I'm referring her back to her surgeon she may have a recurrent mass in her abdomen versus hernia. I'm otherwise please were overall her overall performance. I've asked to see her back in 4-5 months for follow-up. She scheduled to see Dr. Angelina Sheriff early November for possible discussion of antiestrogen therapy. He husband both know to call with any concerns.  I would like to take this opportunity to thank you for allowing me to participate in the care of your patient.Noreene Filbert, MD

## 2018-09-05 ENCOUNTER — Encounter: Payer: Self-pay | Admitting: Hematology and Oncology

## 2018-09-05 ENCOUNTER — Inpatient Hospital Stay: Payer: Medicare Other | Admitting: Hematology and Oncology

## 2018-09-05 ENCOUNTER — Inpatient Hospital Stay: Payer: Medicare Other | Attending: Hematology and Oncology

## 2018-09-05 VITALS — BP 139/84 | HR 80 | Temp 97.4°F | Resp 18 | Ht 66.5 in | Wt 163.0 lb

## 2018-09-05 DIAGNOSIS — R5383 Other fatigue: Secondary | ICD-10-CM | POA: Diagnosis not present

## 2018-09-05 DIAGNOSIS — C50411 Malignant neoplasm of upper-outer quadrant of right female breast: Secondary | ICD-10-CM

## 2018-09-05 DIAGNOSIS — Z923 Personal history of irradiation: Secondary | ICD-10-CM | POA: Diagnosis not present

## 2018-09-05 DIAGNOSIS — F419 Anxiety disorder, unspecified: Secondary | ICD-10-CM | POA: Diagnosis not present

## 2018-09-05 DIAGNOSIS — Z79899 Other long term (current) drug therapy: Secondary | ICD-10-CM | POA: Diagnosis not present

## 2018-09-05 DIAGNOSIS — Z803 Family history of malignant neoplasm of breast: Secondary | ICD-10-CM | POA: Diagnosis not present

## 2018-09-05 DIAGNOSIS — Z8249 Family history of ischemic heart disease and other diseases of the circulatory system: Secondary | ICD-10-CM | POA: Insufficient documentation

## 2018-09-05 DIAGNOSIS — Z170 Estrogen receptor positive status [ER+]: Secondary | ICD-10-CM

## 2018-09-05 LAB — COMPREHENSIVE METABOLIC PANEL
ALT: 47 U/L — ABNORMAL HIGH (ref 0–44)
AST: 40 U/L (ref 15–41)
Albumin: 4 g/dL (ref 3.5–5.0)
Alkaline Phosphatase: 47 U/L (ref 38–126)
Anion gap: 10 (ref 5–15)
BUN: 10 mg/dL (ref 8–23)
CO2: 26 mmol/L (ref 22–32)
Calcium: 9.3 mg/dL (ref 8.9–10.3)
Chloride: 99 mmol/L (ref 98–111)
Creatinine, Ser: 0.7 mg/dL (ref 0.44–1.00)
GFR calc Af Amer: >60 mL/min (ref >60)
GFR calc non Af Amer: >60 mL/min (ref >60)
Glucose, Bld: 114 mg/dL — ABNORMAL HIGH (ref 70–99)
Potassium: 3.4 mmol/L — ABNORMAL LOW (ref 3.5–5.1)
Sodium: 135 mmol/L (ref 135–145)
Total Bilirubin: 0.6 mg/dL (ref 0.3–1.2)
Total Protein: 6.7 g/dL (ref 6.5–8.1)

## 2018-09-05 LAB — CBC WITH DIFFERENTIAL/PLATELET
Abs Immature Granulocytes: 0.01 10*3/uL (ref 0.00–0.07)
Basophils Absolute: 0 10*3/uL (ref 0.0–0.1)
Basophils Relative: 1 %
Eosinophils Absolute: 0.2 10*3/uL (ref 0.0–0.5)
Eosinophils Relative: 6 %
HCT: 39 % (ref 36.0–46.0)
Hemoglobin: 12.9 g/dL (ref 12.0–15.0)
Immature Granulocytes: 0 %
Lymphocytes Relative: 22 %
Lymphs Abs: 0.9 10*3/uL (ref 0.7–4.0)
MCH: 28.1 pg (ref 26.0–34.0)
MCHC: 33.1 g/dL (ref 30.0–36.0)
MCV: 85 fL (ref 80.0–100.0)
Monocytes Absolute: 0.4 10*3/uL (ref 0.1–1.0)
Monocytes Relative: 11 %
Neutro Abs: 2.5 10*3/uL (ref 1.7–7.7)
Neutrophils Relative %: 60 %
Platelets: 211 10*3/uL (ref 150–400)
RBC: 4.59 MIL/uL (ref 3.87–5.11)
RDW: 13.7 % (ref 11.5–15.5)
WBC: 4.1 10*3/uL (ref 4.0–10.5)
nRBC: 0 % (ref 0.0–0.2)

## 2018-09-05 MED ORDER — LETROZOLE 2.5 MG PO TABS: 2.5000 mg | ORAL_TABLET | Freq: Every day | ORAL | 0 refills | Status: DC

## 2018-09-05 NOTE — Progress Notes (Signed)
[facility] day:  09/05/2018  Chief Complaint: Kendra [name] is a 72 y.o. female with multifocal stage IA right breast cancer who is seen for 1 month assessment follow completion of radiation.  HPI: Patient was last seen in the medical oncology clinic on 08/05/2018.  At that time, she was seen following completion of radiation.  Symptomatically, she remained fatigued. She had a recent psychotic break. She had questionable AH as patient states, "there are things going on in my ears. They sort of come and terrify me because I don't know what will happen".  She denied any VH.  She denied any breast concerns. Exam was grossly stable.   Decision was made to postpone initiation of hormonal therapy x 1 month.  Head MRI on 08/08/2018 revealed no acute intracranial abnormality.  NeuroQuant volumetric analysis of the brain suggests age advanced brain volume loss  She was seen in follow up consult on 08/24/2018 by Dr. Noreene Filbert (radiation oncology). Notes reviewed. Patient referred back to surgeon to discuss abdominal mass vs. developing hernia. Patient will be seeing Dr. Gardiner Coins PA this week to discuss a developing hernia in the LEFT upper quadrant of her abdomen. She will follow up with radiation oncology in 4-5 months.   During the interim, patient is doing well. She denies any acute concerns today. Patient feels generally well. Mood has improved. She has periods of confusion and anxiety. Husband states, "We are trying to limit information input to help reduce the anxiety with her".  Patient continues to have intermittent auditory hallucinations, but denies any visual hallucinations.  She is under the care of psychiatry at this point, and has recently been started back on Wellbutrin, Prozac, Risperdal, and Klonopin after being off of her medications for several months.  Patient denies fevers and weight loss. She has intermittent vasomotor symptoms and episodes  of nocturnal diaphoresis. She denies any interval infections.  She walks 3x/day for 20 minutes.  Patient advises that she maintains an adequate appetite. She is eating well. Weight today is 163 lb (73.9 kg), which compared to her last visit to the clinic, represents a 6 pound increase.    Patient denies pain in the clinic today.   Past Medical History:  Diagnosis Date  . Anxiety   . Breast cancer ([place]) 03/2018   right breast  . Complication of anesthesia   . Depression   . Family history of breast cancer 03/24/2018  . Family history of lung cancer 03/24/2018  . History of alcohol dependence ([place]) 2007   no ETOH; resolved since 2007  . History of psychosis 2014   due to sleep disturbance  . Hypertension   . Personal history of radiation therapy   . PONV (postoperative nausea and vomiting)     Past Surgical History:  Procedure Laterality Date  . BREAST LUMPECTOMY WITH RADIOACTIVE SEED AND SENTINEL LYMPH NODE BIOPSY Right 04/27/2018   Procedure: RIGHT BREAST LUMPECTOMY WITH RADIOACTIVE SEED X 2 AND RIGHT SENTINEL LYMPH NODE BIOPSY;  Surgeon: [name], MD;  Location: [facility];  Service: General;  Laterality: Right;  . LAPAROTOMY N/A 02/10/2018   Procedure: EXPLORATORY LAPAROTOMY;  Surgeon: Isabel Caprice, MD;  Location: [facility];  Service: Gynecology;  Laterality: N/A;  . MASS EXCISION  01/2018   abdominal  . OVARIAN CYST REMOVAL    . SALPINGOOPHORECTOMY Bilateral 02/10/2018   Procedure: BILATERAL SALPINGO OOPHORECTOMY; PERITONEAL WASHINGS;  Surgeon: Isabel Caprice, MD;  Location: [facility];  Service: Gynecology;  Laterality: Bilateral;  . TONSILLECTOMY    . TONSILLECTOMY AND ADENOIDECTOMY  1953    Family History  Problem Relation Age of Onset  . Diabetes Mother   . Breast cancer Mother 40  . Hypertension Mother   . Cancer Mother   . Lung cancer Father        asbestos exposure  . Cancer Father   . Heart disease Brother 73  . Breast cancer Cousin 18        paternal cousin  . Cancer Cousin   . Heart disease Paternal Grandmother 31  . Heart disease Paternal Grandfather     Social History:  reports that she has never smoked. She has never used smokeless tobacco. She reports that she drank alcohol. She reports that she does not use drugs.  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 She lives in [place].  The patient is accompanied by he patient accompanied by her husband, [name], today.  Allergies:  Allergies  Allergen Reactions  . Hydroxyzine Anaphylaxis    Tongue swollen  . Shrimp [Shellfish Allergy] Other (See Comments)    Food poisoning     Current Medications: Current Outpatient Medications  Medication Sig Dispense Refill  . buPROPion (WELLBUTRIN XL) 300 MG 24 hr tablet TAKE ONE TABLET BY MOUTH DAILY 30 tablet 0  . Cyanocobalamin (B-12 COMPLIANCE INJECTION) 1000 MCG/ML KIT Inject as directed once a week. X 4 weeks, then will get monthly    . FLUoxetine (PROZAC) 20 MG capsule TAKE ONE CAPSULE BY MOUTH DAILY 30 capsule 0  . lisinopril-hydrochlorothiazide (PRINZIDE,ZESTORETIC) 20-12.5 MG tablet Take 1 tablet by mouth daily.    . risperiDONE (RISPERDAL) 1 MG tablet TAKE ONE TABLET BY MOUTH EVERY NIGHT AT BEDTIME 30 tablet 0  . clonazePAM (KLONOPIN) 0.5 MG tablet Take 0.5 mg by mouth 2 (two) times daily.     Marland Kitchen docusate sodium (COLACE) 100 MG capsule Take 100 mg by mouth as needed for mild constipation.    Marland Kitchen LORazepam (ATIVAN) 0.5 MG tablet Take 1 tablet (0.5 mg total) by mouth daily as needed for anxiety. (Patient not taking: Reported on 08/05/2018) 30 tablet 0  . OVER THE COUNTER MEDICATION Take 2 each by mouth at bedtime. OTC Pharma GABA sleep med at bedtime for sleep per pt.     . silver sulfADIAZINE (SILVADENE) 1 % cream Apply 1 application topically 2 (two) times daily. (Patient not taking: Reported on 08/05/2018) 50 g 2   No current facility-administered medications for this visit.      Review of Systems   Constitutional: Positive for diaphoresis (nocturnal) and weight loss (weight up 6 pounds). Negative for chills, fever and malaise/fatigue (energy has improved).       Feels "ok".  HENT: Negative.  Negative for congestion, ear discharge, ear pain, nosebleeds, sinus pain and sore throat.   Eyes: Negative.  Negative for double vision, photophobia, pain, discharge and redness.  Respiratory: Positive for shortness of breath (exertional; improved). Negative for cough, hemoptysis and sputum production.   Cardiovascular: Negative.  Negative for chest pain, palpitations, orthopnea, leg swelling and PND.  Gastrointestinal: Positive for constipation (7 days between bowel movements). Negative for abdominal pain, blood in stool, diarrhea, melena, nausea and vomiting.  Genitourinary: Negative.  Negative for dysuria, frequency, hematuria and urgency.  Musculoskeletal: Negative.  Negative for back pain, falls, joint pain and myalgias.  Skin: Negative for itching and rash.       Senile purpura  Neurological: Negative  for dizziness, tremors, weakness and headaches.  Endo/Heme/Allergies: Does not bruise/bleed easily.       Vasomotor symptoms  Psychiatric/Behavioral: Positive for hallucinations (auditory) and memory loss. Negative for depression and suicidal ideas. The patient is nervous/anxious. The patient does not have insomnia.        Mood improved some  All other systems reviewed and are negative.  Performance status (ECOG): 2 - Symptomatic, <50% confined to bed  Vital Signs BP 139/84 (BP Location: Left Arm, Patient Position: Sitting)   Pulse 80   Temp (!) 97.4 F (36.3 C) (Tympanic)   Resp 18   Ht 5' 6.5" (1.689 m)   Wt 163 lb (73.9 kg)   SpO2 98%   BMI 25.91 kg/m   Physical Exam  Constitutional: She is oriented to person, place, and time and well-developed, well-nourished, and in no distress. No distress.  HENT:  Head: Normocephalic and atraumatic.  Mouth/Throat: Oropharynx is clear and  moist and mucous membranes are normal.  Shoulder length brown hair.  Eyes: Pupils are equal, round, and reactive to light. Conjunctivae and EOM are normal. Right eye exhibits no discharge. Left eye exhibits no discharge. No scleral icterus.  Glasses propped up on head.  Blue eyes.  Neck: Normal range of motion. Neck supple. No JVD present.  Cardiovascular: Normal rate, regular rhythm, normal heart sounds and intact distal pulses. Exam reveals no gallop and no friction rub.  No murmur heard. Pulmonary/Chest: Effort normal and breath sounds normal. No respiratory distress. She has no wheezes. She has no rales.  Abdominal: Soft. Bowel sounds are normal. She exhibits no distension and no mass. There is no tenderness. There is no rebound and no guarding.  Musculoskeletal: Normal range of motion. She exhibits no edema or tenderness.  Lymphadenopathy:    She has no cervical adenopathy.    She has no axillary adenopathy.       Right: No inguinal and no supraclavicular adenopathy present.       Left: No inguinal and no supraclavicular adenopathy present.  Neurological: She is alert and oriented to person, place, and time. Gait normal.  Skin: Skin is warm and dry. Lesion (scattered moles) noted. No rash noted. She is not diaphoretic. No erythema.  Psychiatric: Judgment normal. Her mood appears anxious. She does not exhibit a depressed mood (improved). She expresses no homicidal and no suicidal ideation. She has a flat affect.  Nursing note and vitals reviewed.   Appointment on 09/05/2018  Component Date Value Ref Range Status  . WBC 09/05/2018 4.1  4.0 - 10.5 K/uL Final  . RBC 09/05/2018 4.59  3.87 - 5.11 MIL/uL Final  . Hemoglobin 09/05/2018 12.9  12.0 - 15.0 g/dL Final  . HCT 09/05/2018 39.0  36.0 - 46.0 % Final  . MCV 09/05/2018 85.0  80.0 - 100.0 fL Final  . MCH 09/05/2018 28.1  26.0 - 34.0 pg Final  . MCHC 09/05/2018 33.1  30.0 - 36.0 g/dL Final  . RDW 09/05/2018 13.7  11.5 - 15.5 % Final   . Platelets 09/05/2018 211  150 - 400 K/uL Final  . nRBC 09/05/2018 0.0  0.0 - 0.2 % Final  . Neutrophils Relative % 09/05/2018 60  % Final  . Neutro Abs 09/05/2018 2.5  1.7 - 7.7 K/uL Final  . Lymphocytes Relative 09/05/2018 22  % Final  . Lymphs Abs 09/05/2018 0.9  0.7 - 4.0 K/uL Final  . Monocytes Relative 09/05/2018 11  % Final  . Monocytes Absolute 09/05/2018 0.4  0.1 -  1.0 K/uL Final  . Eosinophils Relative 09/05/2018 6  % Final  . Eosinophils Absolute 09/05/2018 0.2  0.0 - 0.5 K/uL Final  . Basophils Relative 09/05/2018 1  % Final  . Basophils Absolute 09/05/2018 0.0  0.0 - 0.1 K/uL Final  . Immature Granulocytes 09/05/2018 0  % Final  . Abs Immature Granulocytes 09/05/2018 0.01  0.00 - 0.07 K/uL Final   Performed at [facility], [address] [place], [place] [address]  . Sodium 09/05/2018 135  135 - 145 mmol/L Final  . Potassium 09/05/2018 3.4* 3.5 - 5.1 mmol/L Final  . Chloride 09/05/2018 99  98 - 111 mmol/L Final  . CO2 09/05/2018 26  22 - 32 mmol/L Final  . Glucose, Bld 09/05/2018 114* 70 - 99 mg/dL Final  . BUN 09/05/2018 10  8 - 23 mg/dL Final  . Creatinine, Ser 09/05/2018 0.70  0.44 - 1.00 mg/dL Final  . Calcium 09/05/2018 9.3  8.9 - 10.3 mg/dL Final  . Total Protein 09/05/2018 6.7  6.5 - 8.1 g/dL Final  . Albumin 09/05/2018 4.0  3.5 - 5.0 g/dL Final  . AST 09/05/2018 40  15 - 41 U/L Final  . ALT 09/05/2018 47* 0 - 44 U/L Final  . Alkaline Phosphatase 09/05/2018 47  38 - 126 U/L Final  . Total Bilirubin 09/05/2018 0.6  0.3 - 1.2 mg/dL Final  . GFR calc non Af Amer 09/05/2018 >60  >60 mL/min Final  . GFR calc Af Amer 09/05/2018 >60  >60 mL/min Final   Comment: (NOTE) The eGFR has been calculated using the CKD EPI equation. This calculation has not been validated in all clinical situations. eGFR's persistently <60 mL/min signify possible Chronic Kidney Disease.   Georgiann Hahn gap 09/05/2018 10  5 - 15 Final   Performed at [facility], [address] [place], [place] [address]    Assessment:  Danelly Hassinger is a 72 y.o. female with multi-focal stage IA right breast cancer s/p right lumpectomy on 04/27/2018.  Pathology of the right lateral lesion revealed a 2.5 cm grade II invasive ductal carcinoma with intermediate grade DCIS.  The medial lesion revealed a 1.1 cm grade II invasive ductal carcinoma with intermediate grade DCIS. LVIDS was present.  Margins were negative.  Zero of 2 lymph nodes were positive. Both tumors were ER + (100%), PR + (70%), and HER-2 negative.  Ki-67 was 10%.  Pathologic stage was mT2N0.  Oncotype DX revealed a recurrence score of 12 which translated to a distant recurrence of 3% at 9 years with an AI or tamxifen alone.  The absolute benefit of chemotherpay was < 1%.  Inv[employer]ae testing on 03/23/2018 revealed a variant of uncertain significance in MSH6.   She underwent exploratory laparotomy, bilateral salpingo-oophrectomy, and pelvic washings on 02/10/2018. 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  Peritoneal washings revealed atypical cells.  Bone density on 06/28/2018 was normal with a T score of -0.8 in the right femoral neck.  Symptomatically, patient has improved overall. Her mood is "better". She remains anxious, with questionable auditory hallucinations; no VH. Patient experiencing facial flushing and episodes of nocturnal diaphoresis. Exam is grossly unchanged from previous.  WBC 4100 ([place] 2500).  Potassium slightly low at 3.4 mmol/L.  ALT elevated at 47 U/L.   Plan: 1. Labs today:  CBC with diff, CMP. 2. Multifocal stage IA RIGHT breast cancer  Doing well.  She denies any breast concerns.  Reviewed plans for initiation of adjuvant endocrine therapy  using letrozole x 5 years.  Medication side effects again reviewed.  Patient verbalizes understanding and consent.  Rx: Letrozole 2.5 mg daily (Disp #30). 3. Hypokalemia  Labs reviewed.  Potassium low at 3.4 mmol/L.  Encourage  patient to increase dietary potassium intake.  Will continue routine lab monitoring. 4. Psychiatric issues  Some mood improvement noted.  Remains anxious.  Under the care of psychiatry.   Recently started back on Wellbutrin, Prozac, Risperdal, and Klonopin.   Patient remains and supportive environment.    Questionable persistent AH, with no VH.    No SI or HI.    Patient verbally contracts for safety. 5. Bone density  Review normal bone density done on 06/28/2018.  T score was -0.8.  Reviewed potential for bone thinning associated with prolonged aromatase inhibitor therapy.  Patient encouraged to began supplemental calcium 1200 mg and vitamin D 800 IU daily. 6. RTC in 1 month for MD assessment and labs (CMP).   Honor Loh, NP  09/05/2018, 2:46 PM   I saw and evaluated the patient, participating in the key portions of the service and reviewing pertinent diagnostic studies and records.  I reviewed the nurse practitioner's note and agree with the findings and the plan.  The assessment and plan were discussed with the patient.  Several questions were asked by the patient and answered.   Nolon Stalls, MD 09/05/2018,2:46 PM

## 2018-09-05 NOTE — Patient Instructions (Signed)
Letrozole tablets What is this medicine? LETROZOLE (LET roe zole) blocks the production of estrogen. It is used to treat breast cancer. This medicine may be used for other purposes; ask your health care provider or pharmacist if you have questions. COMMON BRAND NAME(S): Femara What should I tell my health care provider before I take this medicine? They need to know if you have any of these conditions: -high cholesterol -liver disease -osteoporosis (weak bones) -an unusual or allergic reaction to letrozole, other medicines, foods, dyes, or preservatives -pregnant or trying to get pregnant -breast-feeding How should I use this medicine? Take this medicine by mouth with a glass of water. You may take it with or without food. Follow the directions on the prescription label. Take your medicine at regular intervals. Do not take your medicine more often than directed. Do not stop taking except on your doctor's advice. Talk to your pediatrician regarding the use of this medicine in children. Special care may be needed. Overdosage: If you think you have taken too much of this medicine contact a poison control center or emergency room at once. NOTE: This medicine is only for you. Do not share this medicine with others. What if I miss a dose? If you miss a dose, take it as soon as you can. If it is almost time for your next dose, take only that dose. Do not take double or extra doses. What may interact with this medicine? Do not take this medicine with any of the following medications: -estrogens, like hormone replacement therapy or birth control pills This medicine may also interact with the following medications: -dietary supplements such as androstenedione or DHEA -prasterone -tamoxifen This list may not describe all possible interactions. Give your health care provider a list of all the medicines, herbs, non-prescription drugs, or dietary supplements you use. Also tell them if you smoke, drink  alcohol, or use illegal drugs. Some items may interact with your medicine. What should I watch for while using this medicine? Tell your doctor or healthcare professional if your symptoms do not start to get better or if they get worse. Do not become pregnant while taking this medicine or for 3 weeks after stopping it. Women should inform their doctor if they wish to become pregnant or think they might be pregnant. There is a potential for serious side effects to an unborn child. Talk to your health care professional or pharmacist for more information. Do not breast-feed while taking this medicine or for 3 weeks after stopping it. This medicine may interfere with the ability to have a child. Talk with your doctor or health care professional if you are concerned about your fertility. Using this medicine for a long time may increase your risk of low bone mass. Talk to your doctor about bone health. You may get drowsy or dizzy. Do not drive, use machinery, or do anything that needs mental alertness until you know how this medicine affects you. Do not stand or sit up quickly, especially if you are an older patient. This reduces the risk of dizzy or fainting spells. You may need blood work done while you are taking this medicine. What side effects may I notice from receiving this medicine? Side effects that you should report to your doctor or health care professional as soon as possible: -allergic reactions like skin rash, itching, or hives -bone fracture -chest pain -signs and symptoms of a blood clot such as breathing problems; changes in vision; chest pain; severe, sudden headache; pain, swelling,   warmth in the leg; trouble speaking; sudden numbness or weakness of the face, arm or leg -vaginal bleeding Side effects that usually do not require medical attention (report to your doctor or health care professional if they continue or are bothersome): -bone, back, joint, or muscle  pain -dizziness -fatigue -fluid retention -headache -hot flashes, night sweats -nausea -weight gain This list may not describe all possible side effects. Call your doctor for medical advice about side effects. You may report side effects to FDA at 1-800-FDA-1088. Where should I keep my medicine? Keep out of the reach of children. Store between 15 and 30 degrees C (59 and 86 degrees F). Throw away any unused medicine after the expiration date. NOTE: This sheet is a summary. It may not cover all possible information. If you have questions about this medicine, talk to your doctor, pharmacist, or health care provider.  2018 Elsevier/Gold Standard (2016-05-25 11:10:41)  

## 2018-09-05 NOTE — Progress Notes (Signed)
No new changes noted today 

## 2018-09-12 ENCOUNTER — Other Ambulatory Visit: Payer: Self-pay

## 2018-09-12 ENCOUNTER — Ambulatory Visit (INDEPENDENT_AMBULATORY_CARE_PROVIDER_SITE_OTHER): Payer: Medicare Other | Admitting: General Surgery

## 2018-09-12 ENCOUNTER — Encounter: Payer: Self-pay | Admitting: General Surgery

## 2018-09-12 VITALS — BP 146/73 | HR 96 | Temp 97.9°F | Ht 66.0 in | Wt 162.0 lb

## 2018-09-12 DIAGNOSIS — K432 Incisional hernia without obstruction or gangrene: Secondary | ICD-10-CM

## 2018-09-12 NOTE — Patient Instructions (Addendum)
Laparoscopic Ventral Hernia Repair Laparoscopic ventral hernia repairis a procedure to fix a bulge of tissue that pushes through a weak area of muscle in the abdomen (ventral hernia). A ventral hernia may be at the belly button (umbilical), above the belly button (epigastric), or at the incision site from previous abdominal surgery (incisional hernia). You may have this procedure as emergency surgery if part of your intestine gets trapped inside the hernia and starts to lose its blood supply (strangulation). Laparoscopic surgery is done through small incisions using a thin surgical telescope with a light and camera on the end (laparoscope). During surgery, your surgeon will use images from the laparoscope to guide the procedure. A mesh screen will be placed in the hernia to close the opening and strengthen the abdominal wall. Tell a health care provider about:  Any allergies you have.  All medicines you are taking, including vitamins, herbs, eye drops, creams, and over-the-counter medicines.  Any problems you or family members have had with anesthetic medicines.  Any blood disorders you have.  Any surgeries you have had.  Any medical conditions you have.  Whether you are pregnant or may be pregnant. What are the risks? Generally, this is a safe procedure. However, problems may occur, including:  Infection.  Bleeding.  Allergic reactions to medicines.  Damage to other structures or organs in the abdomen.  Trouble urinating or having a bowel movement after surgery.  Pneumonia.  Blood clots.  The hernia coming back after surgery.  Fluid buildup in the area of the hernia.  In some cases, your health care provider may need to switch from a laparoscopic procedure to a procedure that is done through a single, larger incision in the abdomen (open procedure). You may need an open procedure if:  You have a hernia that is difficult to repair.  Your organs are hard to see.  You have  bleeding problems during the laparoscopic procedure.  What happens before the procedure? Staying hydrated Follow instructions from your health care provider about hydration, which may include:  Up to 2 hours before the procedure - you may continue to drink clear liquids, such as water, clear fruit juice, black coffee, and plain tea.  Eating and drinking restrictions Follow instructions from your health care provider about eating and drinking, which may include:  8 hours before the procedure - stop eating heavy meals or foods such as meat, fried foods, or fatty foods.  6 hours before the procedure - stop eating light meals or foods, such as toast or cereal.  6 hours before the procedure - stop drinking milk or drinks that contain milk.  2 hours before the procedure - stop drinking clear liquids.  Medicines  Ask your health care provider about: ? Changing or stopping your regular medicines. This is especially important if you are taking diabetes medicines or blood thinners. ? Taking medicines such as aspirin and ibuprofen. These medicines can thin your blood. Do not take these medicines before your procedure if your health care provider instructs you not to.  You may be given antibiotic medicine to help prevent infection. General instructions  You may be asked to take a laxative or do an enema to empty your bowel before surgery (bowel prep).  Do not use any products that contain nicotine or tobacco, such as cigarettes and e-cigarettes. If you need help quitting, ask your health care provider.  You may need to have tests before the procedure, such as: ? Blood tests. ? Urine tests. ?  Abdominal ultrasound. ? Chest X-ray. ? Electrocardiogram (ECG).  Plan to have someone take you home from the hospital or clinic.  If you will be going home right after the procedure, plan to have someone with you for 24 hours. What happens during the procedure?  To reduce your risk of  infection: ? Your health care team will wash or sanitize their hands. ? Your skin will be washed with soap.  An IV tube will be inserted into one of your veins.  You will be given one or more of the following: ? A medicine to help you relax (sedative). ? A medicine to make you fall asleep (general anesthetic).  A small incision will be made in your abdomen. A hollow metal tube (trocar) will be placed through the incision.  A tube will be placed through the trocar to inflate your abdomen with air-like gas. This makes it easier for your surgeon to see inside your abdomen and do the repair.  The laparoscope will be inserted into your abdomen through the trocar. The laparoscope will send images to a monitor in the operating room.  Other trocars will be put through other small incisions in your abdomen. The surgical instruments needed for the procedure will be placed through these trocars.  The tissue or intestines that make up the hernia will be moved back into place.  The edges of the hernia may be stitched together.  A piece of mesh will be used to close the hernia. Stitches (sutures), clips, or staples will be used to keep the mesh in place.  A bandage (dressing) or skin glue will be put over the incisions. The procedure may vary among health care providers and hospitals. What happens after the procedure?  Your blood pressure, heart rate, breathing rate, and blood oxygen level will be monitored until the medicines you were given have worn off.  You will continue to receive fluids and medicines through an IV tube. Your IV tube will be removed when you can drink clear fluids.  You will be given pain medicine as needed.  You will be encouraged to get up and walk around as soon as possible.  You may have to wear compression stockings. These stockings help to prevent blood clots and reduce swelling in your legs.  You will be shown how to do deep breathing exercises to help prevent a  lung infection.  Do not drive for 24 hours if you were given a sedative. This information is not intended to replace advice given to you by your health care provider. Make sure you discuss any questions you have with your health care provider. Document Released: 10/05/2012 Document Revised: 06/05/2016 Document Reviewed: 06/05/2016 Elsevier Interactive Patient Education  2018 [employer].  Ventral Hernia A ventral hernia is a bulge of tissue from inside the abdomen that pushes through a weak area of the muscles that form the front wall of the abdomen. The tissues inside the abdomen are inside a sac (peritoneum). These tissues include the small intestine, large intestine, and the fatty tissue that covers the intestines (omentum). Sometimes, the bulge that forms a hernia contains intestines. Other hernias contain only fat. Ventral hernias do not go away without surgical treatment. There are several types of ventral hernias. You may have: A hernia at an incision site from previous abdominal surgery (incisional hernia). A hernia just above the belly button (epigastric hernia), or at the belly button (umbilical hernia). These types of hernias can develop from heavy lifting or straining. A  hernia that comes and goes (reducible hernia). It may be visible only when you lift or strain. This type of hernia can be pushed back into the abdomen (reduced). A hernia that traps abdominal tissue inside the hernia (incarcerated hernia). This type of hernia does not reduce. A hernia that cuts off blood flow to the tissues inside the hernia (strangulated hernia). The tissues can start to die if this happens. This is a very painful bulge that cannot be reduced. A strangulated hernia is a medical emergency.  What are the causes? This condition is caused by abdominal tissue putting pressure on an area of weakness in the abdominal muscles. What increases the risk? The following factors may make you more likely to  develop this condition: Being female. Being 54 or older. Being overweight or obese. Having had previous abdominal surgery, especially if there was an infection after surgery. Having had an injury to the abdominal wall. Having had several pregnancies. Having a buildup of fluid inside the abdomen (ascites).  What are the signs or symptoms? The only symptom of a ventral hernia may be a painless bulge in the abdomen. A reducible hernia may be visible only when you strain, cough, or lift. Other symptoms may include: Dull pain. A feeling of pressure.  Signs and symptoms of a strangulated hernia may include: Increasing pain. Nausea and vomiting. Pain when pressing on the hernia. The skin over the hernia turning red or purple. Constipation. Blood in the stool (feces).  How is this diagnosed? This condition may be diagnosed based on: Your symptoms. Your medical history. A physical exam. You may be asked to cough or strain while standing. These actions increase the pressure inside your abdomen and force the hernia through the opening in your muscles. Your health care provider may try to reduce the hernia by pressing on it. Imaging studies, such as an ultrasound or CT scan.  How is this treated? This condition is treated with surgery. If you have a strangulated hernia, surgery is done as soon as possible. If your hernia is small and not incarcerated, you may be asked to lose some weight before surgery. Follow these instructions at home: Follow instructions from your health care provider about eating or drinking restrictions. If you are overweight, your health care provider may recommend that you increase your activity level and eat a healthier diet. Do not lift anything that is heavier than 10 lb (4.5 kg). Return to your normal activities as told by your health care provider. Ask your health care provider what activities are safe for you. You may need to avoid activities that increase pressure  on your hernia. Take over-the-counter and prescription medicines only as told by your health care provider. Keep all follow-up visits as told by your health care provider. This is important. Contact a health care provider if: Your hernia gets larger. Your hernia becomes painful. Get help right away if: Your hernia becomes increasingly painful. You have pain along with any of the following: Changes in skin color in the area of the hernia. Nausea. Vomiting. Fever. Summary A ventral hernia is a bulge of tissue from inside the abdomen that pushes through a weak area of the muscles that form the front wall of the abdomen. This condition is treated with surgery, which may be urgent depending on your hernia. Do not lift anything that is heavier than 10 lb (4.5 kg), and follow activity instructions from your health care provider. This information is not intended to replace advice given to  you by your health care provider. Make sure you discuss any questions you have with your health care provider. Document Released: 10/05/2012 Document Revised: 06/05/2016 Document Reviewed: 06/05/2016 Elsevier Interactive Patient Education  [employer].    Return in six months for follow up exam. The patient will be encouraged to make use of a daily fiber supplement.

## 2018-09-12 NOTE — Progress Notes (Signed)
Patient ID: Kendra [name], female   DOB: [birth year], 72 y.o.   MRN: [id number]  No chief complaint on file.   HPI Kendra [name] is a 72 y.o. female.   She has been referred for surgical evaluation of a mass on her abdomen. 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 Incidentally, on imaging performed for pre-op evaluation for that operation, a breast mass was detected.  This proved to be carcinoma. She had breast-conserving surgery, followed by radiation and letrozole therapy.  She is followed by Dr. Mike Gip in the cancer center here at [facility].    Kendra [name] is accompanied today by her husband, who helps provide some of the history. The patient states that sometimes the bump pops out and sometimes it is flat.  It sometimes seems to get "hard" but then resolves.  She has never had a time where she was unable to manually reduce the mass.  No symptoms of obstipation, nausea/vomiting, bloating, or other concern for incarceration/obstruction.  She does endorse significant constipation and straining to have bowel movements as a result.  She does not have a regular bowel regimen, but takes colace from time to time.  No imaging has been performed.   Past Medical History:  Diagnosis Date  . Anxiety   . Breast cancer ([place]) 03/2018   right breast  . Complication of anesthesia   . Depression   . Family history of breast cancer 03/24/2018  . Family history of lung cancer 03/24/2018  . History of alcohol dependence ([place]) 2007   no ETOH; resolved since 2007  . History of psychosis 2014   due to sleep disturbance  . Hypertension   . Personal history of radiation therapy   . PONV (postoperative nausea and vomiting)     Past Surgical History:  Procedure Laterality Date  . BREAST LUMPECTOMY WITH RADIOACTIVE SEED AND SENTINEL LYMPH NODE BIOPSY Right  04/27/2018   Procedure: RIGHT BREAST LUMPECTOMY WITH RADIOACTIVE SEED X 2 AND RIGHT SENTINEL LYMPH NODE BIOPSY;  Surgeon: [name], MD;  Location: [facility];  Service: General;  Laterality: Right;  . LAPAROTOMY N/A 02/10/2018   Procedure: EXPLORATORY LAPAROTOMY;  Surgeon: Isabel Caprice, MD;  Location: [facility];  Service: Gynecology;  Laterality: N/A;  . MASS EXCISION  01/2018   abdominal  . OVARIAN CYST REMOVAL    . SALPINGOOPHORECTOMY Bilateral 02/10/2018   Procedure: BILATERAL SALPINGO OOPHORECTOMY; PERITONEAL WASHINGS;  Surgeon: Isabel Caprice, MD;  Location: [facility];  Service: Gynecology;  Laterality: Bilateral;  . TONSILLECTOMY    . TONSILLECTOMY AND ADENOIDECTOMY  1953    Family History  Problem Relation Age of Onset  . Diabetes Mother   . Breast cancer Mother 34  . Hypertension Mother   . Cancer Mother   . Lung cancer Father        asbestos exposure  . Cancer Father   . Heart disease Brother 15  . Breast cancer Cousin 82       paternal cousin  . Cancer Cousin   . Heart disease Paternal Grandmother 27  . Heart disease Paternal Grandfather     Social History Social History   Tobacco Use  . Smoking status: Never Smoker  . Smokeless tobacco: Never Used  Substance Use Topics  . Alcohol use: Not Currently    Frequency: Never    Comment: alcohol dependence prior  to 2007  . Drug use: Never    Allergies  Allergen Reactions  . Hydroxyzine Anaphylaxis    Tongue swollen  . Shrimp [Shellfish Allergy] Other (See Comments)    Food poisoning     Current Outpatient Medications  Medication Sig Dispense Refill  . buPROPion (WELLBUTRIN XL) 300 MG 24 hr tablet TAKE ONE TABLET BY MOUTH DAILY 30 tablet 0  . docusate sodium (COLACE) 100 MG capsule Take 100 mg by mouth as needed for mild constipation.    Marland Kitchen FLUoxetine (PROZAC) 20 MG capsule TAKE ONE CAPSULE BY MOUTH DAILY 30 capsule 0  . letrozole (FEMARA) 2.5 MG tablet Take 1 tablet (2.5 mg total) by  mouth daily. 30 tablet 0  . lisinopril-hydrochlorothiazide (PRINZIDE,ZESTORETIC) 20-12.5 MG tablet Take 1 tablet by mouth daily.    Marland Kitchen OVER THE COUNTER MEDICATION Take 2 each by mouth at bedtime. [employer]TC [employer]arma GABA sleep med at bedtime for sleep per pt.     . risperiDONE (RISPERDAL) 1 MG tablet TAKE ONE TABLET BY MOUTH EVERY NIGHT AT BEDTIME 30 tablet 0  . clonazePAM (KLONOPIN) 0.5 MG tablet Take 0.5 mg by mouth 2 (two) times daily.      No current facility-administered medications for this visit.     Review of Systems Review of Systems  Constitutional: Negative.   HENT: Negative.   Eyes: Negative.   Respiratory: Negative.   Cardiovascular: Negative.   Gastrointestinal: Positive for constipation. Negative for nausea and vomiting.  Endocrine: Negative.   Genitourinary: Negative.   Musculoskeletal: Negative.   Skin: Negative.   Psychiatric/Behavioral:       EMR review discusses psychiatric symptoms, however none are reported today.  All other systems reviewed and are negative.   Blood pressure (!) 146/73, pulse 96, temperature 97.9 F (36.6 C), temperature source Skin, height 5\' 6"  (1.676 m), weight 162 lb (73.5 kg), SpO2 98 %.  Physical Exam Physical Exam  Constitutional: She is oriented to person, place, and time. She appears well-developed and well-nourished. No distress.  HENT:  Head: Normocephalic and atraumatic.  Mouth/Throat: No oropharyngeal exudate.  Neck: Normal range of motion. No thyromegaly present.  Cardiovascular: Normal rate, regular rhythm and intact distal pulses.  Pulmonary/Chest: Effort normal and breath sounds normal.  Abdominal: Soft. Bowel sounds are normal. She exhibits mass. She exhibits no distension. A hernia is present.    There is a vertical midline incision with a reducible soft mass in the upper portion (supraumbilical).  Fascial defect edges not palpated.  Musculoskeletal: She exhibits no edema, tenderness or deformity.  Lymphadenopathy:    She  has no cervical adenopathy.  Neurological: She is alert and oriented to person, place, and time.  Skin: Skin is warm and dry.  Hirsutism present.  Extensive seborrheic keratoses.   Psychiatric: Her behavior is normal.  Tangential thought process and mild psychomotor slowing.    Data Reviewed EMR reviewed for operative notes and pathology, as well as pre-op CT scan from April 2019.  No post-op imaging has been obtained.  Assessment    72 y/o F with a history of a large benign ovarian neoplasm s/p ex lap and bilateral salpingoophorectomy, now with a ventral incisional hernia in the upper half of her incision.  She denies symptoms.    Plan    We discussed the common history of incisional hernias, including the possibility of incarceration, strangulation, enlargement in size over time, and the risk of emergency surgery in the face of strangulation. I also discussed the risks of surgery  including injury to adjacent structures, recurrence which can be up to 30% in the case of complex hernias, use of prosthetic materials (mesh) and the increased risk of infection, post-op infection and the possible need for re-operation and removal of mesh if used, possibility of post-op bowel obstruction or ileus, and the risks of general anesthetic including MI, CVA, sudden death or even reaction to anesthetic medications. Although I have recommended repair, she is extremely reluctant to undergo another operation at this time.  She was provided with additional information about the proposed operation as well as general information about hernias, including emergency signs. She was advised to limit her lifting to 10 lbs maximum and to take additional fiber or over the counter stool softeners to avoid needing to strain to have bowel movements.  I've asked her to return in 6 months for re-evaluation, but she may return sooner if she changes her mind about surgery in the interim.   Fredirick Maudlin 09/12/2018, 2:02  PM

## 2018-10-04 ENCOUNTER — Other Ambulatory Visit: Payer: Self-pay | Admitting: Urgent Care

## 2018-10-06 ENCOUNTER — Encounter: Payer: Self-pay | Admitting: Hematology and Oncology

## 2018-10-06 ENCOUNTER — Inpatient Hospital Stay: Payer: Medicare Other

## 2018-10-06 ENCOUNTER — Other Ambulatory Visit: Payer: Self-pay

## 2018-10-06 ENCOUNTER — Inpatient Hospital Stay: Payer: Medicare Other | Attending: Hematology and Oncology

## 2018-10-06 ENCOUNTER — Inpatient Hospital Stay (HOSPITAL_BASED_OUTPATIENT_CLINIC_OR_DEPARTMENT_OTHER): Payer: Medicare Other | Admitting: Hematology and Oncology

## 2018-10-06 VITALS — BP 117/76 | HR 68 | Temp 98.0°F | Resp 18 | Wt 168.4 lb

## 2018-10-06 DIAGNOSIS — R7989 Other specified abnormal findings of blood chemistry: Secondary | ICD-10-CM | POA: Diagnosis not present

## 2018-10-06 DIAGNOSIS — Z79811 Long term (current) use of aromatase inhibitors: Secondary | ICD-10-CM | POA: Diagnosis not present

## 2018-10-06 DIAGNOSIS — Z170 Estrogen receptor positive status [ER+]: Secondary | ICD-10-CM

## 2018-10-06 DIAGNOSIS — Z79899 Other long term (current) drug therapy: Secondary | ICD-10-CM | POA: Insufficient documentation

## 2018-10-06 DIAGNOSIS — E876 Hypokalemia: Secondary | ICD-10-CM | POA: Diagnosis not present

## 2018-10-06 DIAGNOSIS — C50411 Malignant neoplasm of upper-outer quadrant of right female breast: Secondary | ICD-10-CM

## 2018-10-06 DIAGNOSIS — G4700 Insomnia, unspecified: Secondary | ICD-10-CM | POA: Insufficient documentation

## 2018-10-06 DIAGNOSIS — R61 Generalized hyperhidrosis: Secondary | ICD-10-CM | POA: Diagnosis not present

## 2018-10-06 DIAGNOSIS — C50211 Malignant neoplasm of upper-inner quadrant of right female breast: Secondary | ICD-10-CM | POA: Insufficient documentation

## 2018-10-06 DIAGNOSIS — N951 Menopausal and female climacteric states: Secondary | ICD-10-CM | POA: Insufficient documentation

## 2018-10-06 DIAGNOSIS — R945 Abnormal results of liver function studies: Secondary | ICD-10-CM

## 2018-10-06 DIAGNOSIS — E538 Deficiency of other specified B group vitamins: Secondary | ICD-10-CM | POA: Insufficient documentation

## 2018-10-06 DIAGNOSIS — Z452 Encounter for adjustment and management of vascular access device: Secondary | ICD-10-CM

## 2018-10-06 DIAGNOSIS — K5900 Constipation, unspecified: Secondary | ICD-10-CM

## 2018-10-06 DIAGNOSIS — R978 Other abnormal tumor markers: Secondary | ICD-10-CM | POA: Insufficient documentation

## 2018-10-06 DIAGNOSIS — R971 Elevated cancer antigen 125 [CA 125]: Secondary | ICD-10-CM | POA: Insufficient documentation

## 2018-10-06 LAB — COMPREHENSIVE METABOLIC PANEL
ALT: 50 U/L — ABNORMAL HIGH (ref 0–44)
AST: 37 U/L (ref 15–41)
Albumin: 3.8 g/dL (ref 3.5–5.0)
Alkaline Phosphatase: 49 U/L (ref 38–126)
Anion gap: 8 (ref 5–15)
BUN: 10 mg/dL (ref 8–23)
CO2: 28 mmol/L (ref 22–32)
Calcium: 9 mg/dL (ref 8.9–10.3)
Chloride: 97 mmol/L — ABNORMAL LOW (ref 98–111)
Creatinine, Ser: 0.83 mg/dL (ref 0.44–1.00)
GFR calc Af Amer: >60 mL/min (ref >60)
GFR calc non Af Amer: >60 mL/min (ref >60)
Glucose, Bld: 106 mg/dL — ABNORMAL HIGH (ref 70–99)
Potassium: 3.7 mmol/L (ref 3.5–5.1)
Sodium: 133 mmol/L — ABNORMAL LOW (ref 135–145)
Total Bilirubin: 0.5 mg/dL (ref 0.3–1.2)
Total Protein: 6.5 g/dL (ref 6.5–8.1)

## 2018-10-06 MED ORDER — LETROZOLE 2.5 MG PO TABS: 2.5000 mg | ORAL_TABLET | Freq: Every day | ORAL | 3 refills | Status: DC

## 2018-10-06 NOTE — Progress Notes (Signed)
[facility] day:  10/06/2018  Chief Complaint: Kendra [name] is a 72 y.o. female with multifocal stage IA right breast cancer who is seen for 1 month assessment on Femara.  HPI:  The patient was last seen in the medical oncology clinic on 09/05/2018.  At that time, she had improved overall.  Her mood was "better". She remained anxious, with questionable auditory hallucinations; no VH. Patient experiencing facial flushing and episodes of nocturnal diaphoresis. Exam was grossly unchanged.  WBC was 4100 ([place] 2500).  Potassium was 3.4.  ALT was 47.   She began Femara on 09/06/2018.  During the interim, patient complains of increased constipation. She states, "my surgeon does not want me straining because I have the hernia". Patient is using Metamucil, oatmeal, prune juice, and a stool softener. Despite interventions, patient notes that her constipation has "worsened".    Patient complains of "sensory system overload" more as of late. She really appreciates this with loud sounds, when riding in the car, and with bright flickering lights. Patient is using "blue light blockers" when watching television. Patient has issues with insomnia. She is taking melatonin, however insomnia and ability to maintain sleep cycles has been worse. Patient states, "when my system gets on overload, I get tired". Patient notes impaired information processing, and is increasingly forgetful.   Patient has mild nocturnal diaphoresis and vasomotor symptoms. She is taking her letrozole as prescribed. Other that the aforementioned symptoms, patient is tolerating adjuvant endocrine therapy regimen well. She denies any interval infections. Patient denies bleeding; no hematochezia, melena, or gross hematuria. She has never undergone routine colonoscopy.   Patient advises that she maintains an adequate appetite. She is eating well. Weight today is 168 lb 6 oz (76.4 kg), which compared to her  last visit to the clinic, represents a 5 pound increase. Patient is taking bother oral and parenteral B12 supplementation.   Patient denies pain in the clinic today.   Past Medical History:  Diagnosis Date  . Anxiety   . Breast cancer ([place]) 03/2018   right breast  . Complication of anesthesia   . Depression   . Family history of breast cancer 03/24/2018  . Family history of lung cancer 03/24/2018  . History of alcohol dependence ([place]) 2007   no ETOH; resolved since 2007  . History of psychosis 2014   due to sleep disturbance  . Hypertension   . Personal history of radiation therapy   . PONV (postoperative nausea and vomiting)     Past Surgical History:  Procedure Laterality Date  . BREAST LUMPECTOMY WITH RADIOACTIVE SEED AND SENTINEL LYMPH NODE BIOPSY Right 04/27/2018   Procedure: RIGHT BREAST LUMPECTOMY WITH RADIOACTIVE SEED X 2 AND RIGHT SENTINEL LYMPH NODE BIOPSY;  Surgeon: [name], MD;  Location: [facility];  Service: General;  Laterality: Right;  . LAPAROTOMY N/A 02/10/2018   Procedure: EXPLORATORY LAPAROTOMY;  Surgeon: Isabel Caprice, MD;  Location: [facility];  Service: Gynecology;  Laterality: N/A;  . MASS EXCISION  01/2018   abdominal  . OVARIAN CYST REMOVAL    . SALPINGOOPHORECTOMY Bilateral 02/10/2018   Procedure: BILATERAL SALPINGO OOPHORECTOMY; PERITONEAL WASHINGS;  Surgeon: Isabel Caprice, MD;  Location: [facility];  Service: Gynecology;  Laterality: Bilateral;  . TONSILLECTOMY    . TONSILLECTOMY AND ADENOIDECTOMY  1953    Family History  Problem Relation Age of Onset  . Diabetes Mother   . Breast cancer Mother 9  . Hypertension Mother   .  Cancer Mother   . Lung cancer Father        asbestos exposure  . Cancer Father   . Heart disease Brother 27  . Breast cancer Cousin 6       paternal cousin  . Cancer Cousin   . Heart disease Paternal Grandmother 66  . Heart disease Paternal Grandfather     Social History:  reports that she has  never smoked. She has never used smokeless tobacco. She reports that she drank alcohol. She reports that she does not use drugs.  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 She lives in [place].  The patient is accompanied by he patient accompanied by her husband, [name], today.  Allergies:  Allergies  Allergen Reactions  . Hydroxyzine Anaphylaxis    Tongue swollen  . Shrimp [Shellfish Allergy] Other (See Comments)    Food poisoning     Current Medications: Current Outpatient Medications  Medication Sig Dispense Refill  . buPROPion (WELLBUTRIN XL) 300 MG 24 hr tablet TAKE ONE TABLET BY MOUTH DAILY 30 tablet 0  . docusate sodium (COLACE) 100 MG capsule Take 100 mg by mouth as needed for mild constipation.    Marland Kitchen ELDERBERRY PO Take by mouth.    Marland Kitchen FLUoxetine (PROZAC) 20 MG capsule TAKE ONE CAPSULE BY MOUTH DAILY 30 capsule 0  . letrozole (FEMARA) 2.5 MG tablet Take 1 tablet (2.5 mg total) by mouth daily. 30 tablet 0  . lisinopril-hydrochlorothiazide (PRINZIDE,ZESTORETIC) 20-12.5 MG tablet Take 1 tablet by mouth daily.    Marland Kitchen OVER THE COUNTER MEDICATION Take 2 each by mouth at bedtime. OTC Pharma GABA sleep med at bedtime for sleep per pt.     . risperiDONE (RISPERDAL) 1 MG tablet TAKE ONE TABLET BY MOUTH EVERY NIGHT AT BEDTIME 30 tablet 0  . clonazePAM (KLONOPIN) 0.5 MG tablet Take 0.5 mg by mouth 2 (two) times daily.      No current facility-administered medications for this visit.     Review of Systems  Constitutional: Positive for diaphoresis (less) and weight loss (up 5 pounds). Negative for chills and fever.       Fatigue on "sensory overload".  HENT: Negative.  Negative for congestion, ear discharge, ear pain, nosebleeds, sinus pain, sore throat and tinnitus.   Eyes: Negative.  Negative for double vision, photophobia, pain, discharge and redness.  Respiratory: Positive for shortness of breath (exertional). Negative for cough, hemoptysis, sputum production and wheezing.    Cardiovascular: Negative.  Negative for chest pain, palpitations, orthopnea, leg swelling and PND.  Gastrointestinal: Positive for constipation ("forever'). Negative for abdominal pain, blood in stool, diarrhea, melena, nausea and vomiting.  Genitourinary: Negative.  Negative for dysuria, frequency, hematuria and urgency.  Musculoskeletal: Negative.  Negative for back pain, falls, joint pain, myalgias and neck pain.  Skin: Negative for itching and rash.       Senile purpura.  Neurological: Negative for dizziness, tingling, tremors, sensory change, speech change, focal weakness, weakness and headaches.  Endo/Heme/Allergies: Does not bruise/bleed easily.       Vasomotor symptoms  Psychiatric/Behavioral: Positive for hallucinations (auditory) and memory loss. Negative for depression and suicidal ideas. The patient is nervous/anxious and has insomnia.        Mood improved some  All other systems reviewed and are negative.  Performance status (ECOG): 2 - Symptomatic, <50% confined to bed  Vital Signs BP 117/76 (BP Location: Left Arm, Patient Position: Sitting)   Pulse 68   Temp 98 F (36.7 C) (  Tympanic)   Resp 18   Wt 168 lb 6 oz (76.4 kg)   BMI 27.18 kg/m   Physical Exam  Constitutional: She is oriented to person, place, and time and well-developed, well-nourished, and in no distress. No distress.  HENT:  Head: Normocephalic and atraumatic.  Mouth/Throat: Oropharynx is clear and moist and mucous membranes are normal.  Shoulder length blonde hair.  Eyes: Pupils are equal, round, and reactive to light. Conjunctivae and EOM are normal. Right eye exhibits no discharge. Left eye exhibits no discharge. No scleral icterus.  Sunglasses propped up on head.  Blue eyes.  Neck: Normal range of motion. Neck supple. No JVD present.  Cardiovascular: Normal rate, regular rhythm, normal heart sounds and intact distal pulses. Exam reveals no gallop and no friction rub.  No murmur  heard. Pulmonary/Chest: Effort normal and breath sounds normal. No respiratory distress. She has no wheezes. She has no rales. Right breast exhibits tenderness. Breasts are asymmetrical (Fibrocystic changes.).  Abdominal: Soft. Bowel sounds are normal. She exhibits no distension and no mass. There is no abdominal tenderness. There is no rebound and no guarding.  Musculoskeletal: Normal range of motion.        General: No tenderness or edema.  Lymphadenopathy:    She has no cervical adenopathy.    She has no axillary adenopathy.       Right: No supraclavicular adenopathy present.       Left: No supraclavicular adenopathy present.  Neurological: She is alert and oriented to person, place, and time. Gait normal.  Skin: Skin is warm and dry. Lesion (scattered moles) noted. No rash noted. She is not diaphoretic. No erythema.  Psychiatric: Judgment normal. Her mood appears anxious. She does not exhibit a depressed mood (improved). She expresses no homicidal and no suicidal ideation. She has a flat affect.  Nursing note and vitals reviewed.   Orders Only on 10/06/2018  Component Date Value Ref Range Status  . Sodium 10/06/2018 133* 135 - 145 mmol/L Final  . Potassium 10/06/2018 3.7  3.5 - 5.1 mmol/L Final  . Chloride 10/06/2018 97* 98 - 111 mmol/L Final  . CO2 10/06/2018 28  22 - 32 mmol/L Final  . Glucose, Bld 10/06/2018 106* 70 - 99 mg/dL Final  . BUN 10/06/2018 10  8 - 23 mg/dL Final  . Creatinine, Ser 10/06/2018 0.83  0.44 - 1.00 mg/dL Final  . Calcium 10/06/2018 9.0  8.9 - 10.3 mg/dL Final  . Total Protein 10/06/2018 6.5  6.5 - 8.1 g/dL Final  . Albumin 10/06/2018 3.8  3.5 - 5.0 g/dL Final  . AST 10/06/2018 37  15 - 41 U/L Final  . ALT 10/06/2018 50* 0 - 44 U/L Final  . Alkaline Phosphatase 10/06/2018 49  38 - 126 U/L Final  . Total Bilirubin 10/06/2018 0.5  0.3 - 1.2 mg/dL Final  . GFR calc non Af Amer 10/06/2018 >60  >60 mL/min Final  . GFR calc Af Amer 10/06/2018 >60  >60 mL/min  Final  . Anion gap 10/06/2018 8  5 - 15 Final   Performed at [facility], [address] [place], [place] [address]    Assessment:  Ciarra Braddy is a 72 y.o. female with multi-focal stage IA right breast cancer s/p right lumpectomy on 04/27/2018.  Pathology of the right lateral lesion revealed a 2.5 cm grade II invasive ductal carcinoma with intermediate grade DCIS.  The medial lesion revealed a 1.1 cm grade II invasive ductal carcinoma with intermediate grade DCIS.  LVIDS was present.  Margins were negative.  Zero of 2 lymph nodes were positive. Both tumors were ER + (100%), PR + (70%), and HER-2 negative.  Ki-67 was 10%.  Pathologic stage was mT2N0.  Oncotype DX revealed a recurrence score of 12 which translated to a distant recurrence of 3% at 9 years with an AI or tamxifen alone.  The absolute benefit of chemotherpay was < 1%.  Inv[employer]ae testing on 03/23/2018 revealed a variant of uncertain significance in MSH6.   She underwent exploratory laparotomy, bilateral salpingo-oophrectomy, and pelvic washings on 02/10/2018. 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  Peritoneal washings revealed atypical cells.  Bone density on 06/28/2018 was normal with a T score of -0.8 in the right femoral neck.  Symptomatically, she is doing well.  Hot flashes and night sweats are "not as bad".  Exam is stable.  ALT is 50.  Plan: 1. Labs today:  CMP, HBV sAg, HBV cAb, HCV Ab. 2. Multifocal stage IA RIGHT breast cancer:  Doing well on letrozole.  Rx:  Letrozole 2.5 mg po q day (dis:#90; 3 refills). 3. Hypokalemia: Potassium normal. 4. Psychiatric issues:  Clinically doing well.  Letrozole does not seem to be problematic. 5. Bone density: Continue calcium 1200 mg and vitamin D 800 IU daily. 6.   Elevated liver function tests:  Etiology unclear.  Abdomen and pelvic CT on 02/04/2018 revealed a large (28.5 x 17 x 25 cm) cystic mass with enhancing septations and nodularity  involving the majority of the abdomen.  Repeat abdomen/pelvic CT. 7.  RTC for MD assessment and review of CT scan.   Honor Loh, NP  10/06/2018, 3:05 PM   I saw and evaluated the patient, participating in the key portions of the service and reviewing pertinent diagnostic studies and records.  I reviewed the nurse practitioner's note and agree with the findings and the plan.  The assessment and plan were discussed with the patient.  Several questions were asked by the patient and answered.   Nolon Stalls, MD 10/06/2018,3:05 PM

## 2018-10-06 NOTE — Progress Notes (Signed)
Pt in for 1 month follow up, reports having some issues with constipation and now has an operable hernia but is not planning to have surgery at this time.  Pt taking letrozole having hot flashes and some other side effects of vertigo and "sensory overload".

## 2018-10-07 LAB — HEPATITIS B CORE ANTIBODY, TOTAL: Hep B Core Total Ab: NEGATIVE

## 2018-10-07 LAB — HEPATITIS C ANTIBODY

## 2018-10-07 LAB — HEPATITIS B SURFACE ANTIGEN: HEP B S AG: NEGATIVE

## 2018-10-14 ENCOUNTER — Ambulatory Visit
Admission: RE | Admit: 2018-10-14 | Discharge: 2018-10-14 | Disposition: A | Discharge status: Home / Self Care | Payer: Medicare Other | Source: Ambulatory Visit | Attending: Urgent Care | Admitting: Urgent Care

## 2018-10-14 DIAGNOSIS — Z170 Estrogen receptor positive status [ER+]: Secondary | ICD-10-CM | POA: Insufficient documentation

## 2018-10-14 DIAGNOSIS — C50211 Malignant neoplasm of upper-inner quadrant of right female breast: Secondary | ICD-10-CM | POA: Diagnosis not present

## 2018-10-14 DIAGNOSIS — R7989 Other specified abnormal findings of blood chemistry: Secondary | ICD-10-CM

## 2018-10-14 DIAGNOSIS — R945 Abnormal results of liver function studies: Secondary | ICD-10-CM | POA: Insufficient documentation

## 2018-10-14 MED ORDER — IOPAMIDOL (ISOVUE-300) INJECTION 61%
100.0000 mL | Freq: Once | INTRAVENOUS | Status: AC | PRN
  Administered 2018-10-14: 100 mL via INTRAVENOUS

## 2018-10-17 ENCOUNTER — Encounter: Payer: Self-pay | Admitting: Hematology and Oncology

## 2018-10-17 ENCOUNTER — Inpatient Hospital Stay (HOSPITAL_BASED_OUTPATIENT_CLINIC_OR_DEPARTMENT_OTHER): Payer: Medicare Other | Admitting: Hematology and Oncology

## 2018-10-17 VITALS — BP 118/78 | HR 83 | Temp 97.8°F | Resp 18 | Wt 169.1 lb

## 2018-10-17 DIAGNOSIS — C50211 Malignant neoplasm of upper-inner quadrant of right female breast: Secondary | ICD-10-CM | POA: Diagnosis not present

## 2018-10-17 DIAGNOSIS — E538 Deficiency of other specified B group vitamins: Secondary | ICD-10-CM | POA: Insufficient documentation

## 2018-10-17 DIAGNOSIS — R971 Elevated cancer antigen 125 [CA 125]: Secondary | ICD-10-CM

## 2018-10-17 DIAGNOSIS — Z79811 Long term (current) use of aromatase inhibitors: Secondary | ICD-10-CM | POA: Diagnosis not present

## 2018-10-17 DIAGNOSIS — R7989 Other specified abnormal findings of blood chemistry: Secondary | ICD-10-CM | POA: Insufficient documentation

## 2018-10-17 DIAGNOSIS — N951 Menopausal and female climacteric states: Secondary | ICD-10-CM

## 2018-10-17 DIAGNOSIS — Z170 Estrogen receptor positive status [ER+]: Secondary | ICD-10-CM

## 2018-10-17 DIAGNOSIS — R61 Generalized hyperhidrosis: Secondary | ICD-10-CM

## 2018-10-17 DIAGNOSIS — G4700 Insomnia, unspecified: Secondary | ICD-10-CM

## 2018-10-17 DIAGNOSIS — C50411 Malignant neoplasm of upper-outer quadrant of right female breast: Secondary | ICD-10-CM

## 2018-10-17 DIAGNOSIS — E876 Hypokalemia: Secondary | ICD-10-CM

## 2018-10-17 DIAGNOSIS — R978 Other abnormal tumor markers: Secondary | ICD-10-CM

## 2018-10-17 DIAGNOSIS — R945 Abnormal results of liver function studies: Secondary | ICD-10-CM

## 2018-10-17 DIAGNOSIS — Z79899 Other long term (current) drug therapy: Secondary | ICD-10-CM

## 2018-10-17 DIAGNOSIS — K5900 Constipation, unspecified: Secondary | ICD-10-CM

## 2018-10-17 NOTE — Progress Notes (Signed)
Pt in for follow up and scan results today. Reports having increase in hot flashes.

## 2018-10-17 NOTE — Progress Notes (Signed)
[facility] day:  10/17/2018  Chief Complaint: Kendra [name] is a 72 y.o. female with multifocal stage IA right breast cancer on Femara who is seen for review of interval abdomen and pelvic CT scan.  HPI:  The patient was last seen in the medical oncology clinic on 10/06/2018.  At that time, she was doing well.  Hot flashes and night sweats were "not as bad".  Exam was stable.  ALT was 50.  Given her history of pelvic mass s/p resection (pathology benign: multilocular mucinous cystadenoma) with peritoneal washings revealing atypical cells, decision was made to pursue a follow-up CT scan.  Hepatitis B surface antigen, hepatitis B core antibody, and hepatitis C antibody were negative on 10/06/2018.  Abdomen and pelvic CT on 10/14/2018 revealed no acute findings.  There was interval surgical resection of large cystic pelvic mass.  There was no evidence of residual or metastatic neoplasm.  There was suspected hepatic cirrhosis.  There were three midline ventral abdominal wall hernias (small epigastric ventral hernia, moderate paraumbilical hernia, and small suprapubic ventral hernia).  [facility] labs on 08/09/2018 revealed a B12 of 232 (low) and a folate of 8.3.  She was started on oral B12 supplementation. Levels did not improve. She began B12 injections on 08/15/2018. Injections administered at home by her cousin, who is a Marine scientist. She continues on oral B12 supplementation as well.  She notes "two more injections planned".  During the interim, she has done well.  She notes some issues with constipation.  She is taking colace and prune juice.  She denies any abdominal symptoms.    Past Medical History:  Diagnosis Date  . Anxiety   . Breast cancer ([place]) 03/2018   right breast  . Complication of anesthesia   . Depression   . Family history of breast cancer 03/24/2018  . Family history of lung cancer 03/24/2018  . History of alcohol dependence ([place]) 2007   no  ETOH; resolved since 2007  . History of psychosis 2014   due to sleep disturbance  . Hypertension   . Personal history of radiation therapy   . PONV (postoperative nausea and vomiting)     Past Surgical History:  Procedure Laterality Date  . BREAST LUMPECTOMY WITH RADIOACTIVE SEED AND SENTINEL LYMPH NODE BIOPSY Right 04/27/2018   Procedure: RIGHT BREAST LUMPECTOMY WITH RADIOACTIVE SEED X 2 AND RIGHT SENTINEL LYMPH NODE BIOPSY;  Surgeon: [name], MD;  Location: [place] [facility];  Service: General;  Laterality: Right;  . LAPAROTOMY N/A 02/10/2018   Procedure: EXPLORATORY LAPAROTOMY;  Surgeon: Isabel Caprice, MD;  Location: [facility];  Service: Gynecology;  Laterality: N/A;  . MASS EXCISION  01/2018   abdominal  . OVARIAN CYST REMOVAL    . SALPINGOOPHORECTOMY Bilateral 02/10/2018   Procedure: BILATERAL SALPINGO OOPHORECTOMY; PERITONEAL WASHINGS;  Surgeon: Isabel Caprice, MD;  Location: [facility];  Service: Gynecology;  Laterality: Bilateral;  . TONSILLECTOMY    . TONSILLECTOMY AND ADENOIDECTOMY  1953    Family History  Problem Relation Age of Onset  . Diabetes Mother   . Breast cancer Mother 28  . Hypertension Mother   . Cancer Mother   . Lung cancer Father        asbestos exposure  . Cancer Father   . Heart disease Brother 52  . Breast cancer Cousin 57       paternal cousin  . Cancer Cousin   . Heart disease Paternal Grandmother 33  .  Heart disease Paternal Grandfather     Social History:  reports that she has never smoked. She has never used smokeless tobacco. She reports previous alcohol use. She reports that she does not use drugs.  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 She lives in [place].  The patient is accompanied by he patient accompanied by her husband, Kendra [name], today.  Allergies:  Allergies  Allergen Reactions  . Hydroxyzine Anaphylaxis    Tongue swollen  . Shrimp [Shellfish Allergy] Other (See Comments)    Food poisoning      Current Medications: Current Outpatient Medications  Medication Sig Dispense Refill  . buPROPion (WELLBUTRIN XL) 300 MG 24 hr tablet TAKE ONE TABLET BY MOUTH DAILY 30 tablet 0  . docusate sodium (COLACE) 100 MG capsule Take 100 mg by mouth as needed for mild constipation.    Marland Kitchen ELDERBERRY PO Take by mouth.    Marland Kitchen FLUoxetine (PROZAC) 20 MG capsule TAKE ONE CAPSULE BY MOUTH DAILY 30 capsule 0  . letrozole (FEMARA) 2.5 MG tablet Take 1 tablet (2.5 mg total) by mouth daily. 90 tablet 3  . lisinopril-hydrochlorothiazide (PRINZIDE,ZESTORETIC) 20-12.5 MG tablet Take 1 tablet by mouth daily.    Marland Kitchen LORazepam (ATIVAN) 0.5 MG tablet Take by mouth.    . MELATONIN PO Take 10 mg by mouth at bedtime as needed.    Marland Kitchen OVER THE COUNTER MEDICATION Take 2 each by mouth at bedtime. OTC Pharma GABA sleep med at bedtime for sleep per pt.     . psyllium (METAMUCIL) 58.6 % powder Take 1 packet by mouth 3 (three) times daily.    . risperiDONE (RISPERDAL) 1 MG tablet TAKE ONE TABLET BY MOUTH EVERY NIGHT AT BEDTIME 30 tablet 0   No current facility-administered medications for this visit.     Review of Systems  Constitutional: Negative.  Negative for chills, diaphoresis, fever, malaise/fatigue and weight loss (up 1 pound).       Feels "ok".  HENT: Negative.  Negative for congestion, ear discharge, ear pain, nosebleeds, sinus pain, sore throat and tinnitus.   Eyes: Negative.  Negative for double vision, photophobia, pain, discharge and redness.  Respiratory: Positive for shortness of breath (exertional). Negative for cough, hemoptysis, sputum production and wheezing.   Cardiovascular: Negative.  Negative for chest pain, palpitations, orthopnea, leg swelling and PND.  Gastrointestinal: Positive for constipation (using colace and prune juice). Negative for abdominal pain, blood in stool, diarrhea, melena, nausea and vomiting.  Genitourinary: Negative.  Negative for dysuria, frequency, hematuria and urgency.   Musculoskeletal: Negative.  Negative for back pain, falls, joint pain, myalgias and neck pain.  Skin: Negative.  Negative for itching and rash.  Neurological: Negative for dizziness, tingling, tremors, sensory change, speech change, focal weakness, seizures, weakness and headaches.  Endo/Heme/Allergies: Does not bruise/bleed easily.       Vasomotor symptoms.  Psychiatric/Behavioral: Positive for memory loss. Negative for depression and hallucinations. The patient has insomnia. The patient is not nervous/anxious.   All other systems reviewed and are negative.  Performance status (ECOG): 2  Vital Signs BP 118/78 (BP Location: Left Arm, Patient Position: Sitting)   Pulse 83   Temp 97.8 F (36.6 C) (Tympanic)   Resp 18   Wt 169 lb 1 oz (76.7 kg)   BMI 27.29 kg/m   Physical Exam  Constitutional: She is oriented to person, place, and time and well-developed, well-nourished, and in no distress. No distress.  HENT:  Head: Normocephalic and atraumatic.  Shoulder length  blonde hair.  Eyes: Conjunctivae and EOM are normal. Right eye exhibits no discharge. Left eye exhibits no discharge. No scleral icterus.  Blue eyes.  Neurological: She is alert and oriented to person, place, and time. Gait normal.  Skin: Lesion (scattered moles) noted. No rash noted. She is not diaphoretic. No erythema.  Psychiatric: Judgment normal.  Nursing note and vitals reviewed.   No visits with results within 3 Day(s) from this visit.  Latest known visit with results is:  Appointment on 10/06/2018  Component Date Value Ref Range Status  . HCV Ab 10/06/2018 <0.1  0.0 - 0.9 s/co ratio Final   Comment: (NOTE)                                  Negative:     < 0.8                             Indeterminate: 0.8 - 0.9                                  Positive:     > 0.9 The CDC recommends that a positive HCV antibody result be followed up with a HCV Nucleic Acid Amplification test (706237). Performed At: [facility] [place], [place] [id number] Rush Farmer MD [id number]   . Hepatitis B Surface Ag 10/06/2018 Negative  Negative Final   Comment: (NOTE) Performed At: [facility] [place], [place] [id number] Rush Farmer MD [id number]   . Hep B Core Total Ab 10/06/2018 Negative  Negative Final   Comment: (NOTE) Performed At: [facility] [place], [place] [id number] Rush Farmer MD YB:[id number]     Assessment:  Nakira Litzau is a 72 y.o. female with multi-focal stage IA right breast cancer s/p right lumpectomy on 04/27/2018.  Pathology of the right lateral lesion revealed a 2.5 cm grade II invasive ductal carcinoma with intermediate grade DCIS.  The medial lesion revealed a 1.1 cm grade II invasive ductal carcinoma with intermediate grade DCIS. LVIDS was present.  Margins were negative.  Zero of 2 lymph nodes were positive. Both tumors were ER + (100%), PR + (70%), and HER-2 negative.  Ki-67 was 10%.  Pathologic stage was mT2N0.  Oncotype DX revealed a recurrence score of 12 which translated to a distant recurrence of 3% at 9 years with an AI or tamxifen alone.  The absolute benefit of chemotherpay was < 1%.  Inv[employer]ae testing on 03/23/2018 revealed a variant of uncertain significance in MSH6.   She received breast radiation from 06/30/2018 - 07/20/2018.  She began Femara on 09/06/2018.  She is tolerating it well.  She underwent exploratory laparotomy, bilateral salpingo-oophrectomy, and pelvic washings on 02/10/2018. 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  Peritoneal washings revealed atypical cells.  CA125 was 48.2 and CA19-9 was 322 on 01/26/2018.  Abdomen and pelvic CT on 02/04/2018 revealed a large (28.5 x 17 x 25 cm) cystic mass with enhancing septations and nodularity involving the majority of the abdomen.  Abdomen and pelvic CT on 10/14/2018 revealed no acute findings.  There was  interval surgical resection of large cystic pelvic mass.  There was no evidence of residual or metastatic neoplasm.  There was suspected hepatic cirrhosis.  There were three midline ventral  abdominal wall hernias (small epigastric ventral hernia, moderate paraumbilical hernia, and small suprapubic ventral hernia).   She has a history of elevated LFTs.  AST was 50 on 10/06/2018.  Hepatitis B and C serologies were negative.  Bone density on 06/28/2018 was normal with a T score of -0.8 in the right femoral neck.  She has B12 deficiency.  B12 was 232 on 08/09/2018.  She began B12 injections on 08/15/2018.  She is also on oral B12.  Symptomatically, she is doing well.  Exam is stable.  Plan: 1. Multifocal stage IA RIGHT breast cancer:  Tolerating endocrine therapy well.  Continue letrozole. 2. Elevated liver function tests:  Review interim labs.  Hepatitis B and C testing negative.  Review abdomen and pelvic CT.  Images personally reviewed.  Agree with radiology interpretation.    Copy of report provided patient.  Imaging reveals no liver masses.    Suspected hepatic cirrhosis.  Recheck LFTs in 1-2 weeks. 3. Bone density: Continue calcium 1200 mg and vitamin D 800 IU daily. 4.   B12 deficiency:  Patient on both oral and IM B12.  Check anti-parietal antibodies and intrinsic factor antibodies.  Recheck B12 and folate at next blood draw.  Discuss likely plan for either oral or IM injections.  If B12 level is good, anticipate oral B12 with follow-up level to ensure stability in future. 5.   Multilocular mucinous cystadenoma:  Benign pathology.  Some atypical cells noted washings.  Abdomen and pelvic CT scans negative.  History of increased markers (CA19-9 and CA125) in 12/2017.  Check markers at next blood draw to ensure that they have normalized. 6.   Hernias:  Imaging reviewed with patient.    Three discrete hernias.  Reviewed signs and symptoms of hernias and when to seek medical  attention. 7.   RTC in 1 week for labs (LFTs, B12, folate, parietal Ab, intrinsic factor Ab, CA19-9, CA125). 8.   RTC in 6 weeks for MD assessment and labs (CBC with diff, CMP, CA27.29).   Honor Loh, NP  10/17/2018, 10:56 AM   I saw and evaluated the patient, participating in the key portions of the service and reviewing pertinent diagnostic studies and records.  I reviewed the nurse practitioner's note and agree with the findings and the plan.  The assessment and plan were discussed with the patient.  Multiple questions were asked by the patient and answered.   Nolon Stalls, MD 10/17/2018,10:56 AM

## 2018-10-24 ENCOUNTER — Inpatient Hospital Stay: Payer: Medicare Other

## 2018-10-24 DIAGNOSIS — Z170 Estrogen receptor positive status [ER+]: Secondary | ICD-10-CM

## 2018-10-24 DIAGNOSIS — R978 Other abnormal tumor markers: Secondary | ICD-10-CM

## 2018-10-24 DIAGNOSIS — E538 Deficiency of other specified B group vitamins: Secondary | ICD-10-CM

## 2018-10-24 DIAGNOSIS — R971 Elevated cancer antigen 125 [CA 125]: Secondary | ICD-10-CM

## 2018-10-24 DIAGNOSIS — C50411 Malignant neoplasm of upper-outer quadrant of right female breast: Secondary | ICD-10-CM

## 2018-10-24 DIAGNOSIS — R7989 Other specified abnormal findings of blood chemistry: Secondary | ICD-10-CM

## 2018-10-24 DIAGNOSIS — R945 Abnormal results of liver function studies: Secondary | ICD-10-CM

## 2018-10-24 DIAGNOSIS — C50211 Malignant neoplasm of upper-inner quadrant of right female breast: Secondary | ICD-10-CM | POA: Diagnosis not present

## 2018-10-24 LAB — HEPATIC FUNCTION PANEL
ALT: 41 U/L (ref 0–44)
AST: 29 U/L (ref 15–41)
Albumin: 3.8 g/dL (ref 3.5–5.0)
Alkaline Phosphatase: 49 U/L (ref 38–126)
BILIRUBIN DIRECT: 0.1 mg/dL (ref 0.0–0.2)
Indirect Bilirubin: 0.4 mg/dL (ref 0.3–0.9)
Total Bilirubin: 0.5 mg/dL (ref 0.3–1.2)
Total Protein: 6.5 g/dL (ref 6.5–8.1)

## 2018-10-24 LAB — VITAMIN B12: Vitamin B-12: 386 pg/mL (ref 180–914)

## 2018-10-24 LAB — FOLATE: Folate: 7.4 ng/mL (ref >5.9)

## 2018-10-25 LAB — CANCER ANTIGEN 19-9: CA 19-9: 48 U/mL — ABNORMAL HIGH (ref 0–35)

## 2018-10-25 LAB — CA 125: Cancer Antigen (CA) 125: 11.3 U/mL (ref 0.0–38.1)

## 2018-10-25 LAB — INTRINSIC FACTOR ANTIBODIES: Intrinsic Factor: 1 AU/mL (ref 0.0–1.1)

## 2018-10-27 LAB — ANTI-PARIETAL ANTIBODY: Parietal Cell Antibody-IgG: 23.2 Units — ABNORMAL HIGH (ref 0.0–20.0)

## 2018-11-01 ENCOUNTER — Telehealth: Payer: Self-pay | Admitting: *Deleted

## 2018-11-01 NOTE — Telephone Encounter (Signed)
Called patient regarding B-12 injections.  No answer and no voice mailbox is set up to leave message.

## 2018-11-01 NOTE — Telephone Encounter (Signed)
-----   Message from Karen Kitchens, NP sent at 11/01/2018  2:18 PM EST ----- Work up (+) for pernicious anemia. Will need long term parenteral supplementation. No need to continue the concurrent oral therapy, as it is likely not to be properly absorbed.   Not entirely sure when she started therapy, but needs a total of 6 weekly injections, followed by monthly.   Find out where she is getting the injections, and plans for going forward.  Gaspar Bidding  ----- Message ----- From: Buel Ream, [facility] [place] Sent: 10/24/2018  11:47 AM EST To: Karen Kitchens, NP

## 2018-11-04 ENCOUNTER — Telehealth: Payer: Self-pay | Admitting: *Deleted

## 2018-11-04 NOTE — Telephone Encounter (Signed)
Attempted to call patient.  No answer.  Will forward this message to [place] and [place] to follow up with patient.

## 2018-11-04 NOTE — Telephone Encounter (Signed)
-----   Message from Karen Kitchens, NP sent at 11/01/2018  2:18 PM EST ----- Work up (+) for pernicious anemia. Will need long term parenteral supplementation. No need to continue the concurrent oral therapy, as it is likely not to be properly absorbed.   Not entirely sure when she started therapy, but needs a total of 6 weekly injections, followed by monthly.   Find out where she is getting the injections, and plans for going forward.  Gaspar Bidding  ----- Message ----- From: Buel Ream, [facility] [place] Sent: 10/24/2018  11:47 AM EST To: Karen Kitchens, NP

## 2018-11-08 ENCOUNTER — Telehealth: Payer: Self-pay

## 2018-11-08 NOTE — Telephone Encounter (Signed)
Called and spoke with the patient to inform of her Anti - parietal antibody was slightly elevated at 23.2. The patient was understanding and agreeable and was to keep schedule appointment.

## 2018-11-24 ENCOUNTER — Encounter: Payer: Self-pay | Admitting: Family Medicine

## 2018-11-24 ENCOUNTER — Ambulatory Visit: Payer: Medicare Other | Admitting: Family Medicine

## 2018-11-24 VITALS — BP 136/80 | HR 76 | Temp 98.4°F

## 2018-11-24 DIAGNOSIS — F1021 Alcohol dependence, in remission: Secondary | ICD-10-CM | POA: Diagnosis not present

## 2018-11-24 DIAGNOSIS — F3342 Major depressive disorder, recurrent, in full remission: Secondary | ICD-10-CM

## 2018-11-24 DIAGNOSIS — I10 Essential (primary) hypertension: Secondary | ICD-10-CM

## 2018-11-24 DIAGNOSIS — F4322 Adjustment disorder with anxiety: Secondary | ICD-10-CM

## 2018-11-24 DIAGNOSIS — Z13220 Encounter for screening for lipoid disorders: Secondary | ICD-10-CM

## 2018-11-24 DIAGNOSIS — E538 Deficiency of other specified B group vitamins: Secondary | ICD-10-CM

## 2018-11-24 DIAGNOSIS — C50211 Malignant neoplasm of upper-inner quadrant of right female breast: Secondary | ICD-10-CM

## 2018-11-24 DIAGNOSIS — Z170 Estrogen receptor positive status [ER+]: Secondary | ICD-10-CM

## 2018-11-24 MED ORDER — LORAZEPAM 0.5 MG PO TABS: 0.5000 mg | ORAL_TABLET | Freq: Every day | ORAL | 2 refills | Status: DC | PRN

## 2018-11-24 MED ORDER — CYANOCOBALAMIN 1000 MCG/ML IJ SOLN: 1000.0000 ug | INTRAMUSCULAR | 3 refills | Status: DC

## 2018-11-24 MED ORDER — LISINOPRIL-HYDROCHLOROTHIAZIDE 20-12.5 MG PO TABS: 1.0000 | ORAL_TABLET | Freq: Every day | ORAL | 3 refills | Status: DC

## 2018-11-24 NOTE — Assessment & Plan Note (Signed)
Stable and well-controlled No alcohol intake since 2007 Patient is doing well

## 2018-11-24 NOTE — Assessment & Plan Note (Signed)
Patient struggling with her cancer diagnosis She is not seeing a therapist, we discussed the importance She is not currently on SSRI, but she is taking Ativan infrequently We discussed the risks of Ativan and will not plan to increase dose or number given per month in the future

## 2018-11-24 NOTE — Progress Notes (Signed)
Patient: Kendra [name], Female    DOB: [birth year], 73 y.o.   MRN: [id number] Visit Date: 11/24/2018  Today's Provider: Lavon Paganini, MD   Chief Complaint  Patient presents with  . New Patient (Initial Visit)   Subjective:    New Patient Appointment Kendra [name] is a 73 y.o. female who presents today for new patient appointment. She feels well. She reports exercising includes waling. She reports she is sleeping fairly well.  States that she lives in [place] part of the year.  She is planning to stay here for the foreseeable future while she is undergoing treatment for her breast cancer.  She is followed by Dr. Mike Gip with oncology.  She underwent lumpectomy with sentinel lymph node biopsy as well as radiation.  She is now on letrozole for the next 5 years.  She states she is having significant hot flashes from the letrozole.  She did have bone density scan before starting letrozole that showed normal bone density.  She does have family history of breast cancer, but her breast cancer was BRCA negative.  Patient underwent exploratory laparotomy with bilateral salpingo-oophorectomy in addition to pelvic washings in 01/2018 for large pelvic mass.  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  This is felt to be benign.  She has not needed to follow-up with GYN onc since that time.  Patient has a history of alcohol abuse.  She has had no alcohol to drink since 2007.  Since her diagnosis with the pelvic mass and breast cancer, patient has struggled with depression and anxiety.  She has a history of psychosis many years ago from a sleep disturbance.  She has had no trouble with this since that time.  She is not seeing a psychiatrist.  She feels as though her mood is stable, but she does have some intermittent anxiety symptoms.  She was previously on Wellbutrin XL 300 mg daily, Prozac 20 mg daily, and Risperdal 1 mg nightly which she  has self discontinued.  She continues to use Ativan 0.5 mg as needed.  She states she only needs this about once every 5 days.  Most of her anxiety is around her health and her cancer diagnosis.  She is treated for hypertension with lisinopril HCTZ 20-12.5 mg daily.  She is taking this with good compliance.  She denies side effects, chest pain, shortness of breath, headaches, symptoms of hypotension. -----------------------------------------------------------------   Review of Systems  Constitutional: Positive for diaphoresis and fatigue.  HENT: Positive for tinnitus.   Eyes: Positive for photophobia and visual disturbance.  Respiratory: Positive for cough.   Cardiovascular: Negative.   Gastrointestinal: Positive for constipation.  Endocrine: Negative.   Genitourinary: Positive for enuresis (sometimes).  Musculoskeletal: Positive for arthralgias (Due to the Letrozole per patient.).  Skin: Negative.   Allergic/Immunologic: Positive for environmental allergies.  Neurological: Positive for light-headedness.  Hematological: Bruises/bleeds easily.  Psychiatric/Behavioral: Negative.     Social History She  reports that she has never smoked. She has never used smokeless tobacco. She reports previous alcohol use. She reports that she does not use drugs. Social History   Socioeconomic History  . Marital status: Married    Spouse name: Not on file  . Number of children: Not on file  . Years of education: Not on file  . Highest education level: Not on file  Occupational History  . Not on file  Social Needs  . Financial resource strain: Not on file  .  Food insecurity:    Worry: Not on file    Inability: Not on file  . Transportation needs:    Medical: Not on file    Non-medical: Not on file  Tobacco Use  . Smoking status: Never Smoker  . Smokeless tobacco: Never Used  Substance and Sexual Activity  . Alcohol use: Not Currently    Frequency: Never    Comment: alcohol dependence  prior to 2007  . Drug use: Never  . Sexual activity: Yes  Lifestyle  . Physical activity:    Days per week: Not on file    Minutes per session: Not on file  . Stress: Not on file  Relationships  . Social connections:    Talks on phone: Not on file    Gets together: Not on file    Attends religious service: Not on file    Active member of club or organization: Not on file    Attends meetings of clubs or organizations: Not on file    Relationship status: Not on file  Other Topics Concern  . Not on file  Social History Narrative  . Not on file    Patient Active Problem List   Diagnosis Date Noted  . Elevated LFTs 10/17/2018  . B12 deficiency 10/17/2018  . Incisional hernia, without obstruction or gangrene 09/12/2018  . Moderate recurrent major depression ([place]) 07/25/2018  . Genetic testing 04/07/2018  . Family history of breast cancer 03/24/2018  . Family history of lung cancer 03/24/2018  . Malignant neoplasm of upper-outer quadrant of right breast in female, estrogen receptor positive ([place]) 03/17/2018  . Malignant neoplasm of upper-inner quadrant of right breast in female, estrogen receptor positive ([place]) 03/17/2018  . Breast mass, right 02/07/2018  . History of alcohol dependence ([place]) 02/07/2018  . History of psychosis 02/07/2018  . History of insomnia 02/07/2018    Past Surgical History:  Procedure Laterality Date  . BREAST LUMPECTOMY WITH RADIOACTIVE SEED AND SENTINEL LYMPH NODE BIOPSY Right 04/27/2018   Procedure: RIGHT BREAST LUMPECTOMY WITH RADIOACTIVE SEED X 2 AND RIGHT SENTINEL LYMPH NODE BIOPSY;  Surgeon: [name], MD;  Location: [facility];  Service: General;  Laterality: Right;  . LAPAROTOMY N/A 02/10/2018   Procedure: EXPLORATORY LAPAROTOMY;  Surgeon: Isabel Caprice, MD;  Location: [facility];  Service: Gynecology;  Laterality: N/A;  . MASS EXCISION  01/2018   abdominal  . OVARIAN CYST REMOVAL    . SALPINGOOPHORECTOMY Bilateral  02/10/2018   Procedure: BILATERAL SALPINGO OOPHORECTOMY; PERITONEAL WASHINGS;  Surgeon: Isabel Caprice, MD;  Location: [facility];  Service: Gynecology;  Laterality: Bilateral;  . TONSILLECTOMY    . [place]    Family History  Family Status  Relation Name Status  . Mother  Alive  . Father  Alive  . Brother  (Not Specified)  . Cousin  (Not Specified)  . PGM  (Not Specified)  . PGF  (Not Specified)   Her family history includes Breast cancer (age of onset: 26) in her cousin; Breast cancer (age of onset: 2) in her mother; Cancer in her cousin, father, and mother; Diabetes in her mother; Heart disease in her paternal grandfather; Heart disease (age of onset: 37) in her brother; Heart disease (age of onset: 33) in her paternal grandmother; Hypertension in her mother; Lung cancer in her father.     Allergies  Allergen Reactions  . Hydroxyzine Anaphylaxis    Tongue swollen  . Shrimp [Shellfish Allergy] Other (See Comments)  Food poisoning     Previous Medications   BUPROPION (WELLBUTRIN XL) 300 MG 24 HR TABLET    TAKE ONE TABLET BY MOUTH DAILY   DOCUSATE SODIUM (COLACE) 100 MG CAPSULE    Take 100 mg by mouth as needed for mild constipation.   ELDERBERRY PO    Take by mouth.   FLUOXETINE (PROZAC) 20 MG CAPSULE    TAKE ONE CAPSULE BY MOUTH DAILY   LETROZOLE (FEMARA) 2.5 MG TABLET    Take 1 tablet (2.5 mg total) by mouth daily.   LISINOPRIL-HYDROCHLOROTHIAZIDE (PRINZIDE,ZESTORETIC) 20-12.5 MG TABLET    Take 1 tablet by mouth daily.   LORAZEPAM (ATIVAN) 0.5 MG TABLET    Take by mouth.   MELATONIN PO    Take 10 mg by mouth at bedtime as needed.   OVER THE COUNTER MEDICATION    Take 2 each by mouth at bedtime. [employer] GABA sleep med at bedtime for sleep per pt.    PSYLLIUM (METAMUCIL) 58.6 % POWDER    Take 1 packet by mouth 3 (three) times daily.   RISPERIDONE (RISPERDAL) 1 MG TABLET    TAKE ONE TABLET BY MOUTH EVERY NIGHT AT BEDTIME    Patient Care  Team: [name], MD as PCP - General (Obstetrics and Gynecology) [name], MD as Consulting Physician (General Surgery) Nicholas Lose, MD as Consulting Physician (Hematology and Oncology) Gery Pray, MD as Consulting Physician (Radiation Oncology)      Objective:   Vitals: BP 136/80 (BP Location: Left Arm, Patient Position: Sitting, Cuff Size: Normal)   Pulse 76   Temp 98.4 F (36.9 C) (Oral)    Physical Exam Vitals signs reviewed.  Constitutional:      General: She is not in acute distress.    Appearance: Normal appearance. She is well-developed. She is not diaphoretic.  HENT:     Head: Normocephalic and atraumatic.     Right Ear: Tympanic membrane, ear canal and external ear normal.     Left Ear: Tympanic membrane, ear canal and external ear normal.     Nose: Nose normal. No congestion.     Mouth/Throat:     Mouth: Mucous membranes are moist.     Pharynx: Oropharynx is clear. No oropharyngeal exudate.  Eyes:     General: No scleral icterus.    Conjunctiva/sclera: Conjunctivae normal.     Pupils: Pupils are equal, round, and reactive to light.  Neck:     Musculoskeletal: Neck supple.     Thyroid: No thyromegaly.  Cardiovascular:     Rate and Rhythm: Normal rate and regular rhythm.     Heart sounds: Normal heart sounds. No murmur.  Pulmonary:     Effort: Pulmonary effort is normal. No respiratory distress.     Breath sounds: Normal breath sounds. No wheezing or rales.  Abdominal:     General: There is no distension.     Palpations: Abdomen is soft.     Tenderness: There is no abdominal tenderness. There is no guarding or rebound.  Musculoskeletal:        General: No deformity.     Right lower leg: No edema.     Left lower leg: No edema.  Lymphadenopathy:     Cervical: No cervical adenopathy.  Skin:    General: Skin is warm and dry.     Capillary Refill: Capillary refill takes less than 2 seconds.     Findings: No rash.  Neurological:     Mental  Status: She is  alert and oriented to person, place, and time. Mental status is at baseline.     Gait: Gait normal.  Psychiatric:        Mood and Affect: Mood normal.        Behavior: Behavior normal.        Thought Content: Thought content normal.      Depression Screen PHQ 2/9 Scores 11/24/2018  PHQ - 2 Score 0  PHQ- 9 Score 3     Assessment & Plan:     Establish care  Exercise Activities and Dietary recommendations Goals   None      There is no immunization history on file for this patient.  Health Maintenance  Topic Date Due  . TETANUS/TDAP  08/20/1966  . COLONOSCOPY  08/20/1997  . PNA vac Low Risk Adult (1 of 2 - PCV13) 08/20/2012  . INFLUENZA VACCINE  06/02/2018  . MAMMOGRAM  03/14/2020  . DEXA SCAN  Completed  . Hepatitis C Screening  Completed    We will send ROI to previous PCP in [place] to obtain screenings and vaccination reports  Discussed health benefits of physical activity, and encouraged her to engage in regular exercise appropriate for her age and condition.    --------------------------------------------------------------------  Problem List Items Addressed This Visit      Cardiovascular and Mediastinum   Essential hypertension    Well-controlled Continue current medications Reviewed recent metabolic panel Follow-up in 6 months      Relevant Medications   lisinopril-hydrochlorothiazide (PRINZIDE,ZESTORETIC) 20-12.5 MG tablet     Other   History of alcohol dependence ([place])    Stable and well-controlled No alcohol intake since 2007 Patient is doing well      Malignant neoplasm of upper-inner quadrant of right breast in female, estrogen receptor positive ([place]) - Primary    Status post right breast lumpectomy with sentinel lymph node biopsy as well as radiation and now on antiestrogen therapy Followed by oncology      Relevant Medications   LORazepam (ATIVAN) 0.5 MG tablet   Moderate recurrent major depression ([place])     Currently in remission Not needing medications currently We could consider resuming Wellbutrin/SSRI in the future if needed      Relevant Medications   LORazepam (ATIVAN) 0.5 MG tablet   B12 deficiency    Followed by oncology and neurology On B12 injections monthly currently Refill for B12 injections sent to the pharmacy Reviewed recent B12 level      Adjustment disorder with anxious mood    Patient struggling with her cancer diagnosis She is not seeing a therapist, we discussed the importance She is not currently on SSRI, but she is taking Ativan infrequently We discussed the risks of Ativan and will not plan to increase dose or number given per month in the future      Screening for lipid disorders   Relevant Orders   Lipid panel       Return in about 6 months (around 05/25/2019) for [place].   The entirety of the information documented in the History of Present Illness, Review of Systems and Physical Exam were personally obtained by me. Portions of this information were initially documented by [facility], CMA and reviewed by me for thoroughness and accuracy.    Virginia Crews, MD, MPH [facility] 11/24/2018 4:26 PM

## 2018-11-24 NOTE — Assessment & Plan Note (Signed)
Followed by oncology and neurology On B12 injections monthly currently Refill for B12 injections sent to the pharmacy Reviewed recent B12 level

## 2018-11-24 NOTE — Assessment & Plan Note (Signed)
Currently in remission Not needing medications currently We could consider resuming Wellbutrin/SSRI in the future if needed

## 2018-11-24 NOTE — Assessment & Plan Note (Signed)
Status post right breast lumpectomy with sentinel lymph node biopsy as well as radiation and now on antiestrogen therapy Followed by oncology 

## 2018-11-24 NOTE — Assessment & Plan Note (Signed)
Well-controlled Continue current medications Reviewed recent metabolic panel Follow-up in 6 months

## 2018-11-28 ENCOUNTER — Other Ambulatory Visit: Payer: Medicare Other

## 2018-11-28 ENCOUNTER — Ambulatory Visit: Payer: Medicare Other | Admitting: Hematology and Oncology

## 2018-12-01 ENCOUNTER — Inpatient Hospital Stay (HOSPITAL_BASED_OUTPATIENT_CLINIC_OR_DEPARTMENT_OTHER): Payer: Medicare Other | Admitting: Hematology and Oncology

## 2018-12-01 ENCOUNTER — Ambulatory Visit: Payer: Medicare Other | Admitting: Hematology and Oncology

## 2018-12-01 ENCOUNTER — Inpatient Hospital Stay: Payer: Medicare Other | Attending: Hematology and Oncology

## 2018-12-01 ENCOUNTER — Encounter: Payer: Self-pay | Admitting: Hematology and Oncology

## 2018-12-01 ENCOUNTER — Other Ambulatory Visit: Payer: Medicare Other

## 2018-12-01 VITALS — BP 137/84 | HR 71 | Temp 98.3°F | Resp 16 | Wt 169.3 lb

## 2018-12-01 DIAGNOSIS — R7989 Other specified abnormal findings of blood chemistry: Secondary | ICD-10-CM | POA: Insufficient documentation

## 2018-12-01 DIAGNOSIS — D510 Vitamin B12 deficiency anemia due to intrinsic factor deficiency: Secondary | ICD-10-CM

## 2018-12-01 DIAGNOSIS — K7460 Unspecified cirrhosis of liver: Secondary | ICD-10-CM | POA: Insufficient documentation

## 2018-12-01 DIAGNOSIS — R978 Other abnormal tumor markers: Secondary | ICD-10-CM | POA: Diagnosis not present

## 2018-12-01 DIAGNOSIS — F329 Major depressive disorder, single episode, unspecified: Secondary | ICD-10-CM | POA: Diagnosis not present

## 2018-12-01 DIAGNOSIS — R971 Elevated cancer antigen 125 [CA 125]: Secondary | ICD-10-CM | POA: Insufficient documentation

## 2018-12-01 DIAGNOSIS — C50411 Malignant neoplasm of upper-outer quadrant of right female breast: Secondary | ICD-10-CM | POA: Diagnosis present

## 2018-12-01 DIAGNOSIS — F419 Anxiety disorder, unspecified: Secondary | ICD-10-CM | POA: Diagnosis not present

## 2018-12-01 DIAGNOSIS — R945 Abnormal results of liver function studies: Secondary | ICD-10-CM

## 2018-12-01 DIAGNOSIS — Z170 Estrogen receptor positive status [ER+]: Secondary | ICD-10-CM

## 2018-12-01 DIAGNOSIS — I10 Essential (primary) hypertension: Secondary | ICD-10-CM | POA: Insufficient documentation

## 2018-12-01 DIAGNOSIS — E538 Deficiency of other specified B group vitamins: Secondary | ICD-10-CM

## 2018-12-01 DIAGNOSIS — Z79811 Long term (current) use of aromatase inhibitors: Secondary | ICD-10-CM | POA: Insufficient documentation

## 2018-12-01 LAB — COMPREHENSIVE METABOLIC PANEL
ALT: 40 U/L (ref 0–44)
AST: 31 U/L (ref 15–41)
Albumin: 4.3 g/dL (ref 3.5–5.0)
Alkaline Phosphatase: 51 U/L (ref 38–126)
Anion gap: 12 (ref 5–15)
BUN: 9 mg/dL (ref 8–23)
CO2: 25 mmol/L (ref 22–32)
Calcium: 9.6 mg/dL (ref 8.9–10.3)
Chloride: 100 mmol/L (ref 98–111)
Creatinine, Ser: 0.63 mg/dL (ref 0.44–1.00)
GFR calc Af Amer: >60 mL/min (ref >60)
GFR calc non Af Amer: >60 mL/min (ref >60)
Glucose, Bld: 80 mg/dL (ref 70–99)
Potassium: 4 mmol/L (ref 3.5–5.1)
Sodium: 137 mmol/L (ref 135–145)
Total Bilirubin: 0.6 mg/dL (ref 0.3–1.2)
Total Protein: 7 g/dL (ref 6.5–8.1)

## 2018-12-01 LAB — CBC WITH DIFFERENTIAL/PLATELET
Abs Immature Granulocytes: 0.01 10*3/uL (ref 0.00–0.07)
Basophils Absolute: 0.1 10*3/uL (ref 0.0–0.1)
Basophils Relative: 1 %
Eosinophils Absolute: 0.2 10*3/uL (ref 0.0–0.5)
Eosinophils Relative: 4 %
HCT: 42.8 % (ref 36.0–46.0)
Hemoglobin: 14.2 g/dL (ref 12.0–15.0)
Immature Granulocytes: 0 %
Lymphocytes Relative: 26 %
Lymphs Abs: 1.3 10*3/uL (ref 0.7–4.0)
MCH: 29.1 pg (ref 26.0–34.0)
MCHC: 33.2 g/dL (ref 30.0–36.0)
MCV: 87.7 fL (ref 80.0–100.0)
Monocytes Absolute: 0.6 10*3/uL (ref 0.1–1.0)
Monocytes Relative: 12 %
Neutro Abs: 2.7 10*3/uL (ref 1.7–7.7)
Neutrophils Relative %: 57 %
Platelets: 218 10*3/uL (ref 150–400)
RBC: 4.88 MIL/uL (ref 3.87–5.11)
RDW: 13.6 % (ref 11.5–15.5)
WBC: 4.9 10*3/uL (ref 4.0–10.5)
nRBC: 0 % (ref 0.0–0.2)

## 2018-12-01 NOTE — Progress Notes (Signed)
Pt here for follow up. Reports she continues to have hot flashes d/t Letrozole. Denies any other concerns at this time.

## 2018-12-01 NOTE — Progress Notes (Signed)
[facility] day:  12/01/2018  Chief Complaint: Kendra [name] is a 73 y.o. female with multifocal stage IA right breast cancer, B12 deficiency who is seen for 6 week assessment.  HPI:  The patient was last seen in the medical oncology clinic on 10/17/2018.  At that time, she was doing well.  Exam was stable.  She was tolerating Femara well.  Abdomen and CT scan revealed no acute changes. Elevated LFTs were felt secondary to cirrhosis.  She was taking both oral and IM B12.  Labs on 10/24/2018 revealed an AST of 41 and ALT 49 (normal).  Anti-parietal antibody was elevated (23; 0-20).  Intrinsic factor was normal (1.0; 0-1.1).  CA125 was 11.3.  CA19-9 was 48 (0-35). Folate was 7.4.  B12 was 386.  Patient continues on monthly parenteral B12 supplementation. She receives the injections at home; administered by family member who is an Therapist, sports. She is no longer taking the oral B12 supplementation.   During the interim, patient is doing well. She has continued vasomotor symptoms associated with her current endocrine therapy (letrozole). Patient denies that she has experienced any B symptoms. She denies any interval infections.  Patient does not verbalize any concerns with regards to her breasts today. Patient performs monthly self breast examinations as recommended.   Patient has 3 known abdominal hernias, which are asymptomatic for the most part. Patient notes intermittent pain in her LLQ "when something slips out and I have to push it back in".   Patient advises that she maintains an adequate appetite. She is eating well. Weight today is 169 lb 5 oz (76.8 kg), which compared to her last visit to the clinic, represents a stable weight.   Patient denies pain in the clinic today.   Past Medical History:  Diagnosis Date  . Anxiety   . Breast cancer ([place]) 03/2018   right breast  . Complication of anesthesia   . Depression   . Family history of breast cancer  03/24/2018  . Family history of lung cancer 03/24/2018  . History of alcohol dependence ([place]) 2007   no ETOH; resolved since 2007  . History of psychosis 2014   due to sleep disturbance  . Hypertension   . Personal history of radiation therapy   . PONV (postoperative nausea and vomiting)     Past Surgical History:  Procedure Laterality Date  . BREAST LUMPECTOMY WITH RADIOACTIVE SEED AND SENTINEL LYMPH NODE BIOPSY Right 04/27/2018   Procedure: RIGHT BREAST LUMPECTOMY WITH RADIOACTIVE SEED X 2 AND RIGHT SENTINEL LYMPH NODE BIOPSY;  Surgeon: [name], MD;  Location: [facility];  Service: General;  Laterality: Right;  . LAPAROTOMY N/A 02/10/2018   Procedure: EXPLORATORY LAPAROTOMY;  Surgeon: Isabel Caprice, MD;  Location: [facility];  Service: Gynecology;  Laterality: N/A;  . MASS EXCISION  01/2018   abdominal  . OVARIAN CYST REMOVAL    . SALPINGOOPHORECTOMY Bilateral 02/10/2018   Procedure: BILATERAL SALPINGO OOPHORECTOMY; PERITONEAL WASHINGS;  Surgeon: Isabel Caprice, MD;  Location: [facility];  Service: Gynecology;  Laterality: Bilateral;  . TONSILLECTOMY    . TONSILLECTOMY AND ADENOIDECTOMY  1953    Family History  Problem Relation Age of Onset  . Diabetes Mother   . Breast cancer Mother 28  . Hypertension Mother   . Lung cancer Father        asbestos exposure  . Heart disease Brother 47  . Breast cancer Cousin 34  paternal cousin  . Heart disease Paternal Grandmother 51  . Heart disease Paternal Grandfather     Social History:  reports that she has never smoked. She has never used smokeless tobacco. She reports previous alcohol use. She reports that she does not use drugs.  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 She lives in [place].  The patient is accompanied by he patient accompanied by her husband, Kendra [name], today.  Allergies:  Allergies  Allergen Reactions  . Hydroxyzine Anaphylaxis    Tongue swollen  . Shrimp [Shellfish  Allergy] Other (See Comments)    Food poisoning     Current Medications: Current Outpatient Medications  Medication Sig Dispense Refill  . cyanocobalamin (,VITAMIN B-12,) 1000 MCG/ML injection Inject 1 mL (1,000 mcg total) into the muscle every 30 (thirty) days. 3 mL 3  . docusate sodium (COLACE) 100 MG capsule Take 100 mg by mouth as needed for mild constipation.    Marland Kitchen ELDERBERRY PO Take 2 tablets by mouth daily.     Marland Kitchen letrozole (FEMARA) 2.5 MG tablet Take 1 tablet (2.5 mg total) by mouth daily. 90 tablet 3  . lisinopril-hydrochlorothiazide (PRINZIDE,ZESTORETIC) 20-12.5 MG tablet Take 1 tablet by mouth daily. 90 tablet 3  . LORazepam (ATIVAN) 0.5 MG tablet Take 1 tablet (0.5 mg total) by mouth daily as needed for anxiety. 30 tablet 2  . MELATONIN PO Take 10 mg by mouth at bedtime as needed.    . psyllium (METAMUCIL) 58.6 % powder Take 1 packet by mouth as needed.      No current facility-administered medications for this visit.     Review of Systems  Constitutional: Negative.  Negative for chills, diaphoresis, fever, malaise/fatigue and weight loss (stable).       "I think I am doing well".  HENT: Negative.  Negative for congestion, ear discharge, ear pain, nosebleeds, sinus pain, sore throat and tinnitus.   Eyes: Negative.  Negative for blurred vision, double vision and photophobia.  Respiratory: Positive for shortness of breath (exertional). Negative for cough, hemoptysis, sputum production and wheezing.   Cardiovascular: Negative.  Negative for chest pain, palpitations, orthopnea, leg swelling and PND.  Gastrointestinal: Positive for constipation (managed with colace and prune juice). Negative for abdominal pain, blood in stool, diarrhea, heartburn, melena, nausea and vomiting.  Genitourinary: Negative.  Negative for dysuria, frequency, hematuria and urgency.  Musculoskeletal: Negative.  Negative for back pain, falls, joint pain, myalgias and neck pain.  Skin: Negative.  Negative for  itching and rash.  Neurological: Negative for dizziness, tingling, tremors, sensory change, speech change, focal weakness, seizures, weakness and headaches.  Endo/Heme/Allergies: Does not bruise/bleed easily.       Vasomotor symptoms.  Psychiatric/Behavioral: Negative for depression, hallucinations and memory loss. The patient has insomnia (uses melatonin). The patient is not nervous/anxious.   All other systems reviewed and are negative.  Performance status (ECOG): 2  Vital Signs BP 137/84 (BP Location: Left Arm, Patient Position: Sitting)   Pulse 71   Temp 98.3 F (36.8 C) (Tympanic)   Resp 16   Wt 169 lb 5 oz (76.8 kg)   SpO2 100%   BMI 27.33 kg/m   Physical Exam  Constitutional: She is oriented to person, place, and time and well-developed, well-nourished, and in no distress. No distress.  HENT:  Head: Normocephalic and atraumatic.  Mouth/Throat: Oropharynx is clear and moist. No oropharyngeal exudate.  Shoulder length blonde hair.  Eyes: Pupils are equal, round, and reactive to light. Conjunctivae and  EOM are normal. Right eye exhibits no discharge. Left eye exhibits no discharge. No scleral icterus.  Blue eyes.  Neck: Normal range of motion. Neck supple. No JVD present.  Cardiovascular: Normal rate and normal heart sounds. Exam reveals no gallop and no friction rub.  No murmur heard. Pulmonary/Chest: Effort normal and breath sounds normal. No respiratory distress. She has no wheezes. She has no rales.  Abdominal: Soft. Bowel sounds are normal. She exhibits no distension and no mass. There is no abdominal tenderness. There is no rebound and no guarding.  Reducible hernia.  Musculoskeletal: Normal range of motion.        General: No tenderness or edema.  Lymphadenopathy:    She has no cervical adenopathy.  Neurological: She is alert and oriented to person, place, and time. Gait normal.  Skin: Skin is warm and dry. Lesion (scattered moles) noted. No rash noted. She is not  diaphoretic. No erythema. No pallor.  Psychiatric: Mood, affect and judgment normal.  Nursing note and vitals reviewed.   Appointment on 12/01/2018  Component Date Value Ref Range Status  . Sodium 12/01/2018 137  135 - 145 mmol/L Final  . Potassium 12/01/2018 4.0  3.5 - 5.1 mmol/L Final  . Chloride 12/01/2018 100  98 - 111 mmol/L Final  . CO2 12/01/2018 25  22 - 32 mmol/L Final  . Glucose, Bld 12/01/2018 80  70 - 99 mg/dL Final  . BUN 12/01/2018 9  8 - 23 mg/dL Final  . Creatinine, Ser 12/01/2018 0.63  0.44 - 1.00 mg/dL Final  . Calcium 12/01/2018 9.6  8.9 - 10.3 mg/dL Final  . Total Protein 12/01/2018 7.0  6.5 - 8.1 g/dL Final  . Albumin 12/01/2018 4.3  3.5 - 5.0 g/dL Final  . AST 12/01/2018 31  15 - 41 U/L Final  . ALT 12/01/2018 40  0 - 44 U/L Final  . Alkaline Phosphatase 12/01/2018 51  38 - 126 U/L Final  . Total Bilirubin 12/01/2018 0.6  0.3 - 1.2 mg/dL Final  . GFR calc non Af Amer 12/01/2018 >60  >60 mL/min Final  . GFR calc Af Amer 12/01/2018 >60  >60 mL/min Final  . Anion gap 12/01/2018 12  5 - 15 Final   Performed at [facility], [address] [place],  [address]  . WBC 12/01/2018 4.9  4.0 - 10.5 K/uL Final  . RBC 12/01/2018 4.88  3.87 - 5.11 MIL/uL Final  . Hemoglobin 12/01/2018 14.2  12.0 - 15.0 g/dL Final  . HCT 12/01/2018 42.8  36.0 - 46.0 % Final  . MCV 12/01/2018 87.7  80.0 - 100.0 fL Final  . MCH 12/01/2018 29.1  26.0 - 34.0 pg Final  . MCHC 12/01/2018 33.2  30.0 - 36.0 g/dL Final  . RDW 12/01/2018 13.6  11.5 - 15.5 % Final  . Platelets 12/01/2018 218  150 - 400 K/uL Final  . nRBC 12/01/2018 0.0  0.0 - 0.2 % Final  . Neutrophils Relative % 12/01/2018 57  % Final  . Neutro Abs 12/01/2018 2.7  1.7 - 7.7 K/uL Final  . Lymphocytes Relative 12/01/2018 26  % Final  . Lymphs Abs 12/01/2018 1.3  0.7 - 4.0 K/uL Final  . Monocytes Relative 12/01/2018 12  % Final  . Monocytes Absolute 12/01/2018 0.6  0.1 - 1.0 K/uL Final  . Eosinophils Relative  12/01/2018 4  % Final  . Eosinophils Absolute 12/01/2018 0.2  0.0 - 0.5 K/uL Final  . Basophils Relative 12/01/2018 1  %  Final  . Basophils Absolute 12/01/2018 0.1  0.0 - 0.1 K/uL Final  . Immature Granulocytes 12/01/2018 0  % Final  . Abs Immature Granulocytes 12/01/2018 0.01  0.00 - 0.07 K/uL Final   Performed at [facility], [address] [place], [place] [address]    Assessment:  Kendra [name] is a 73 y.o. female with multi-focal stage IA right breast cancer s/p right lumpectomy on 04/27/2018.  Pathology of the right lateral lesion revealed a 2.5 cm grade II invasive ductal carcinoma with intermediate grade DCIS.  The medial lesion revealed a 1.1 cm grade II invasive ductal carcinoma with intermediate grade DCIS. LVIDS was present.  Margins were negative.  Zero of 2 lymph nodes were positive. Both tumors were ER + (100%), PR + (70%), and HER-2 negative.  Ki-67 was 10%.  Pathologic stage was mT2N0.  Oncotype DX revealed a recurrence score of 12 which translated to a distant recurrence of 3% at 9 years with an AI or tamxifen alone.  The absolute benefit of chemotherpay was < 1%.  Invitae testing on 03/23/2018 revealed a variant of uncertain significance in MSH6.   She received breast radiation from 06/30/2018 - 07/20/2018.  She began Femara on 09/06/2018.  She is tolerating it well.  She underwent exploratory laparotomy, bilateral salpingo-oophrectomy, and pelvic washings on 02/10/2018. 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  Peritoneal washings revealed atypical cells.    She has had elevated tumor markers.  CA125 has been followed (0-38.1): 48.2 on 01/26/2018 and 11.3 on 10/24/2018. CA19-9 has been followed (0-35): 322 on 01/26/2018 and 48 on 10/24/2018.  Abdomen and pelvic CT on 02/04/2018 revealed a large (28.5 x 17 x 25 cm) cystic mass with enhancing septations and nodularity involving the majority of the abdomen.  Abdomen and pelvic CT on  10/14/2018 revealed no acute findings.  There was interval surgical resection of large cystic pelvic mass.  There was no evidence of residual or metastatic neoplasm.  There was suspected hepatic cirrhosis.  There were three midline ventral abdominal wall hernias (small epigastric ventral hernia, moderate paraumbilical hernia, and small suprapubic ventral hernia).   She has a history of elevated LFTs.  AST was 50 on 10/06/2018.  Hepatitis B and C serologies were negative.  Bone density on 06/28/2018 was normal with a T score of -0.8 in the right femoral neck.  She has B12 deficiency.  B12 was 232 on 08/09/2018 and 386 on 10/24/2018.  She began B12 injections on 08/15/2018.  She receives B12 monthly at home.  Anti-parietal antibody was elevated (23; 0-20) and intrinsic factor was normal on 10/24/2018.  Symptomatically, she is doing well.  She is tolerating Femara with managable vasomotor symptoms.  Exam reveals a reducible hernia.  Plan: 1. Labs today:  CBC with diff, CMP, CA27.29. 2.   Multifocal stage IA RIGHT breast cancer  Clinically doing well.  She is tolerating letrozole.  Breast exam at next visit.  Continue letrozole.  Anticipate next mammogram on 03/15/2019. 3.   Elevated liver function tests- resolved  AST 31.  ALT 40.  Bilirubin 1.0.  Alkaline phosphatase 51.  Patient with history of elevated LFTs.  Hepatitis B and C testing was negative.  Continue surveillance. 4.   Bone density Bone density was normal on 06/28/2018.  Continue calcium 1200 mg and vitamin D 800 U daily. 5.   B12 deficiency  Patient on B12 injections at home.  Anti-parietal antibodies elevated c/w pernicious anemia.  Folate level was 7.4  on 10/24/2018. 6.   Multilocular mucinous cystadenoma  Benign pathology.  Some atypical cells noted washings.  Abdomen and pelvic CT scans was negative.  Patient has a history of increased markers (CA19-9 and CA125) in 12/2017.  Continue surveillance. 7.   RTC in 3 months  for MD assessment and labs (CBCwith diff, CMP, CA27.29).   Honor Loh, NP  12/01/2018, 9:56 AM   I saw and evaluated the patient, participating in the key portions of the service and reviewing pertinent diagnostic studies and records.  I reviewed the nurse practitioner's note and agree with the findings and the plan.  The assessment and plan were discussed with the patient.  Multiple questions were asked by the patient and answered.   Nolon Stalls, MD 12/01/2018,9:56 AM

## 2019-01-09 LAB — TESTOSTERONE
Prolactin: 101.9
Testosterone: 39

## 2019-01-24 ENCOUNTER — Other Ambulatory Visit: Payer: Self-pay

## 2019-01-24 ENCOUNTER — Telehealth: Payer: Self-pay

## 2019-01-24 NOTE — Telephone Encounter (Signed)
Called patient to do her travel screening and to let her know that [facility] is not allowing visitors to come with the patients. However, the patient insisted for her husband to come in with her because he is able to understand better of what is going on with her health. Once again, I implemented the rules and she stated that she would like to speak with someone in reference to this. Please contact patient and explain. Thank you.

## 2019-01-24 NOTE — Telephone Encounter (Signed)
  She needs to have her husband with her.  I know her well.  M

## 2019-01-25 ENCOUNTER — Ambulatory Visit
Admission: RE | Admit: 2019-01-25 | Discharge: 2019-01-25 | Disposition: A | Discharge status: Home / Self Care | Payer: Medicare Other | Source: Ambulatory Visit | Attending: Radiation Oncology | Admitting: Radiation Oncology

## 2019-01-25 ENCOUNTER — Encounter: Payer: Self-pay | Admitting: Radiation Oncology

## 2019-01-25 ENCOUNTER — Ambulatory Visit: Payer: Medicare Other | Admitting: Radiation Oncology

## 2019-01-25 ENCOUNTER — Other Ambulatory Visit: Payer: Self-pay

## 2019-01-25 ENCOUNTER — Telehealth: Payer: Self-pay | Admitting: Urgent Care

## 2019-01-25 VITALS — BP 152/80 | HR 81 | Temp 97.1°F | Resp 18 | Wt 163.4 lb

## 2019-01-25 DIAGNOSIS — Z923 Personal history of irradiation: Secondary | ICD-10-CM | POA: Insufficient documentation

## 2019-01-25 DIAGNOSIS — C50411 Malignant neoplasm of upper-outer quadrant of right female breast: Secondary | ICD-10-CM

## 2019-01-25 DIAGNOSIS — F319 Bipolar disorder, unspecified: Secondary | ICD-10-CM

## 2019-01-25 DIAGNOSIS — Z79811 Long term (current) use of aromatase inhibitors: Secondary | ICD-10-CM | POA: Diagnosis not present

## 2019-01-25 DIAGNOSIS — F419 Anxiety disorder, unspecified: Secondary | ICD-10-CM

## 2019-01-25 DIAGNOSIS — Z170 Estrogen receptor positive status [ER+]: Secondary | ICD-10-CM | POA: Diagnosis not present

## 2019-01-25 DIAGNOSIS — F1011 Alcohol abuse, in remission: Secondary | ICD-10-CM

## 2019-01-25 NOTE — Progress Notes (Signed)
Radiation Oncology Follow up Note  Name: Kendra [name]   Date:   01/25/2019 MRN:  [id number] DOB: 09/07/1946    This 73 y.o. female presents to the clinic today for 6 month follow-up status post whole breast radiation to her right breast for stage IIa invasive mammary carcinoma ER/PR positive.  REFERRING PROVIDER: [name], MD  HPI: patient is a 73 year old female now out 6 months having completed whole breast radiation to her right breast for ER/PR positive stage IIa (T2 N0 M0) invasive mammary carcinoma. Seen today in routine follow-up for breast standpoint she is doing well. She specifically denies breast tenderness cough or bone pain..she is currently currently on letrozole tolerated that well without side effect. She's not yet had a mammogram. Continues to have issues with I believe bipolar disorder.  COMPLICATIONS OF TREATMENT: none  FOLLOW UP COMPLIANCE: keeps appointments   PHYSICAL EXAM:  BP (!) 152/80 (BP Location: Left Arm, Patient Position: Sitting)   Pulse 81   Temp (!) 97.1 F (36.2 C) (Tympanic)   Resp 18   Wt 163 lb 5.8 oz (74.1 kg)   BMI 26.37 kg/m  Lungs are clear to A&P cardiac examination essentially unremarkable with regular rate and rhythm. No dominant mass or nodularity is noted in either breast in 2 positions examined. Incision is well-healed. No axillary or supraclavicular adenopathy is appreciated. Cosmetic result is excellent.Well-developed well-nourished patient in NAD. HEENT reveals PERLA, EOMI, discs not visualized.  Oral cavity is clear. No oral mucosal lesions are identified. Neck is clear without evidence of cervical or supraclavicular adenopathy. Lungs are clear to A&P. Cardiac examination is essentially unremarkable with regular rate and rhythm without murmur rub or thrill. Abdomen is benign with no organomegaly or masses noted. Motor sensory and DTR levels are equal and symmetric in the upper and lower extremities. Cranial nerves II through XII are  grossly intact. Proprioception is intact. No peripheral adenopathy or edema is identified. No motor or sensory levels are noted. Crude visual fields are within normal range.  RADIOLOGY RESULTS: no current films for review  PLAN: present time patient is doing well with no evidence of disease 6 months out from whole breast radiation. I'm please were overall progress. She continues close follow-up care with medical oncology. I've asked to see her back in 1 year for follow-up. I assume a mammogram will be scheduled in near future. Patient continues on Femara without side effect. Patient knows to call with any concerns. Husband was present throughout my evaluation.  I would like to take this opportunity to thank you for allowing me to participate in the care of your patient.Noreene Filbert, MD

## 2019-01-25 NOTE — Telephone Encounter (Signed)
  [facility] [address], [place], [place] [address] Phone: [phone] Fax: [phone]   Date: 01/25/19  Name: Kendra [name] DOB: [birth year] MRN: [id number]  Re: Clinical status update for referral  Received call from cancer center counselor Nathanial Millman) following a referral for Kendra [name] made by members of the radiation oncology staff.  Counselor notes that she was provided no clinical information regarding the nature of what services the patient needed.  Counselor has made contact with the patient's husband, who notes that patient is having a difficult time with her cancer diagnosis.  She is off her psychiatric medications once again.    Clinical update provided to LCSW  -- patient was seen in the ED back in September 2019 by Dr. Weber Cooks for evaluation of an acute exacerbation of patient's psychiatric symptoms.  At that time, patient had been off of her psychiatric medications for 2 months.  Dr. Weber Cooks restarted patient's SSRI (Wellbutrin) and antipsychotic (risperidone) medications.  She was provided BZO (lorazepam) to use on a PRN basis.  Patient was advised to establish care for ongoing psychiatric evaluation and management.  Patient seen in consult on 07/27/2018 by Dr. Jennings Books (neurology).  Patient felt to have multifactorial cognitive impairment related to her bipolar disorder and paranoia, history of alcoholism, and being off of her medications.  There was the additional concern of an underlying neurodegenerative disorder.  Patient was referred to psychiatry at [facility].  In review of the notes, it seems as if referral was denied due to the need for PCP to make referral.  At that time, patient had no local PCP.    In the interim, patient has established care at [facility] with Dr. Lavon Paganini. PCP has made recommendations for patient to establish care with psychiatrist and/or counselor for ongoing management.  PCP had discussed  restarting SSRI for major depression.  Patient continued on BZO therapy.  LCSW advising that patient's husband has contacted her on several occasions today.  From what I gather, the patient is once again experiencing an exacerbation of her psychiatric symptoms, however I have not spoken with the patient/husband to confirm. LCSW has strongly encouraged patient to contact insurance company to determine local psychiatric provider.    In efforts to expedite patient evaluation and management, I have contacted patient's insurance [facility]) to determine an appropriate local psychiatric provider.  [facility] on the approved provider list through the patient's insurance company.  This happens to be the practice at which Dr. Weber Cooks is located.  In efforts to maintain the previously established patient provider relationship, I have entered a referral to this practice today.  If there are any issues with a referral coming from a specialty practice (hematology/oncology), our office will plan on working with patient's insurance company and PCP Brita Romp, MD) in order to be able to proceed with the referral and evaluation.  I have shared my plans with LCSW, who in turn plans to communicate with the patient's husband via phone this afternoon.   Honor Loh, MSN, APRN, FNP-C, CEN Oncology/Hematology Nurse Practitioner  [place] 01/25/19, 3:43 PM

## 2019-01-30 ENCOUNTER — Encounter: Payer: Self-pay | Admitting: Emergency Medicine

## 2019-01-30 ENCOUNTER — Emergency Department
Admission: EM | Admit: 2019-01-30 | Discharge: 2019-01-31 | Disposition: A | Discharge status: Home / Self Care | Payer: Medicare Other | Attending: Emergency Medicine | Admitting: Emergency Medicine

## 2019-01-30 ENCOUNTER — Other Ambulatory Visit: Payer: Self-pay

## 2019-01-30 DIAGNOSIS — R410 Disorientation, unspecified: Secondary | ICD-10-CM | POA: Diagnosis present

## 2019-01-30 DIAGNOSIS — Z853 Personal history of malignant neoplasm of breast: Secondary | ICD-10-CM | POA: Insufficient documentation

## 2019-01-30 DIAGNOSIS — F331 Major depressive disorder, recurrent, moderate: Secondary | ICD-10-CM

## 2019-01-30 DIAGNOSIS — F322 Major depressive disorder, single episode, severe without psychotic features: Secondary | ICD-10-CM | POA: Insufficient documentation

## 2019-01-30 DIAGNOSIS — I10 Essential (primary) hypertension: Secondary | ICD-10-CM | POA: Diagnosis not present

## 2019-01-30 DIAGNOSIS — Z79899 Other long term (current) drug therapy: Secondary | ICD-10-CM | POA: Insufficient documentation

## 2019-01-30 LAB — CBC
HCT: 42.1 % (ref 36.0–46.0)
Hemoglobin: 14.5 g/dL (ref 12.0–15.0)
MCH: 28.7 pg (ref 26.0–34.0)
MCHC: 34.4 g/dL (ref 30.0–36.0)
MCV: 83.2 fL (ref 80.0–100.0)
PLATELETS: 248 10*3/uL (ref 150–400)
RBC: 5.06 MIL/uL (ref 3.87–5.11)
RDW: 13.2 % (ref 11.5–15.5)
WBC: 5.9 10*3/uL (ref 4.0–10.5)
nRBC: 0 % (ref 0.0–0.2)

## 2019-01-30 LAB — COMPREHENSIVE METABOLIC PANEL
ALT: 23 U/L (ref 0–44)
AST: 31 U/L (ref 15–41)
Albumin: 5 g/dL (ref 3.5–5.0)
Alkaline Phosphatase: 48 U/L (ref 38–126)
Anion gap: 15 (ref 5–15)
BUN: 12 mg/dL (ref 8–23)
CO2: 24 mmol/L (ref 22–32)
CREATININE: 0.71 mg/dL (ref 0.44–1.00)
Calcium: 9.9 mg/dL (ref 8.9–10.3)
Chloride: 88 mmol/L — ABNORMAL LOW (ref 98–111)
GFR calc Af Amer: >60 mL/min (ref >60)
GFR calc non Af Amer: >60 mL/min (ref >60)
Glucose, Bld: 102 mg/dL — ABNORMAL HIGH (ref 70–99)
Potassium: 3.3 mmol/L — ABNORMAL LOW (ref 3.5–5.1)
Sodium: 127 mmol/L — ABNORMAL LOW (ref 135–145)
Total Bilirubin: 1 mg/dL (ref 0.3–1.2)
Total Protein: 7.3 g/dL (ref 6.5–8.1)

## 2019-01-30 LAB — SALICYLATE LEVEL: Salicylate Lvl: <7 mg/dL (ref 2.8–30.0)

## 2019-01-30 LAB — ACETAMINOPHEN LEVEL: Acetaminophen (Tylenol), Serum: <10 ug/mL — ABNORMAL LOW (ref 10–30)

## 2019-01-30 LAB — ETHANOL: Alcohol, Ethyl (B): <10 mg/dL (ref <10)

## 2019-01-30 MED ORDER — SODIUM CHLORIDE 0.9 % IV BOLUS
1000.0000 mL | Freq: Once | INTRAVENOUS | Status: AC
  Administered 2019-01-30: 1000 mL via INTRAVENOUS

## 2019-01-30 NOTE — ED Notes (Addendum)
Pt uprite on stretcher in darkened exam room with no distress noted; pt voices good understanding of admin of IVFs; denies c/o at this time; resp even/unlab, lungs clear, apical audible & regular, +BS, abd soft/nondist/nontender, +periph pulses; pt A&Ox3

## 2019-01-30 NOTE — ED Notes (Signed)
Patient get anxious when trying to give her fluids. She said she prefers to be in the room the crowd make her anxious. Patient was moved to room 24. No issues

## 2019-01-30 NOTE — ED Triage Notes (Signed)
PT here with spouse for psych eval/med refill. pt unable to carry on full conversation and process information , pt has psych hx and has been on meds for same symptoms but has not had any meds x61month. PT alert, NAD noted. Denies any SI or HI.

## 2019-01-30 NOTE — ED Notes (Signed)
Shaconda Hajduk, spouse [phone]

## 2019-01-30 NOTE — ED Notes (Signed)
Pt. Transferred from Triage to 19 H.   Report to include Situation, Background, Assessment and Recommendations from triage RN. Pt. Oriented to Quad including Q15 minute rounds as well as Engineer, drilling for their protection. Patient is oriented to person and DOB. Patient said she has loss of memory. Speech is delayed. She said she had a breast cancer and lymph node removed from right arm.  Patient is  warm and dry in no acute distress. Patient denies SI, HI, and AVH. Pt. Encouraged to let me know if needs arise.

## 2019-01-30 NOTE — ED Provider Notes (Signed)
[facility] Emergency Department Provider Note   ____________________________________________   I have reviewed the triage vital signs and the nursing notes.   HISTORY  Chief Complaint Confusion  History limited by and level 5 caveat due to confusion   HPI Kendra [name] is a 73 y.o. female who presents to the emergency department today because of concern for increased confusion and medication adjustment. The patient states she has noticed she has been having a harder time concentrating and has been more confused for the past few weeks. In addition she has noticed that she is having difficulty expressing her opinions. She says that she has been out of her medication for roughly 1 month. She denies any fevers, nausea vomiting, abdominal pain, change in urination or defecation.    Records reviewed. Per medical record review patient has a history of depression.   Past Medical History:  Diagnosis Date  . Anxiety   . Breast cancer ([place]) 03/2018   right breast  . Complication of anesthesia   . Depression   . Family history of breast cancer 03/24/2018  . Family history of lung cancer 03/24/2018  . History of alcohol dependence ([place]) 2007   no ETOH; resolved since 2007  . History of psychosis 2014   due to sleep disturbance  . Hypertension   . Personal history of radiation therapy   . PONV (postoperative nausea and vomiting)     Patient Active Problem List   Diagnosis Date Noted  . High serum carbohydrate antigen 19-9 (CA19-9) 12/01/2018  . Adjustment disorder with anxious mood 11/24/2018  . Essential hypertension 11/24/2018  . Screening for lipid disorders 11/24/2018  . Elevated LFTs 10/17/2018  . B12 deficiency 10/17/2018  . Incisional hernia, without obstruction or gangrene 09/12/2018  . Moderate recurrent major depression ([place]) 07/25/2018  . Genetic testing 04/07/2018  . Family history of breast cancer 03/24/2018  . Family history of lung cancer  03/24/2018  . Malignant neoplasm of upper-outer quadrant of right breast in female, estrogen receptor positive ([place]) 03/17/2018  . Malignant neoplasm of upper-inner quadrant of right breast in female, estrogen receptor positive ([place]) 03/17/2018  . History of alcohol dependence ([place]) 02/07/2018  . History of psychosis 02/07/2018  . History of insomnia 02/07/2018    Past Surgical History:  Procedure Laterality Date  . BREAST LUMPECTOMY WITH RADIOACTIVE SEED AND SENTINEL LYMPH NODE BIOPSY Right 04/27/2018   Procedure: RIGHT BREAST LUMPECTOMY WITH RADIOACTIVE SEED X 2 AND RIGHT SENTINEL LYMPH NODE BIOPSY;  Surgeon: [name], MD;  Location: [place];  Service: General;  Laterality: Right;  . LAPAROTOMY N/A 02/10/2018   Procedure: EXPLORATORY LAPAROTOMY;  Surgeon: Isabel Caprice, MD;  Location: [facility];  Service: Gynecology;  Laterality: N/A;  . MASS EXCISION  01/2018   abdominal  . OVARIAN CYST REMOVAL    . SALPINGOOPHORECTOMY Bilateral 02/10/2018   Procedure: BILATERAL SALPINGO OOPHORECTOMY; PERITONEAL WASHINGS;  Surgeon: Isabel Caprice, MD;  Location: [facility];  Service: Gynecology;  Laterality: Bilateral;  . TONSILLECTOMY    . TONSILLECTOMY AND ADENOIDECTOMY  1953    Prior to Admission medications   Medication Sig Start Date End Date Taking? Authorizing Provider  cyanocobalamin (,VITAMIN B-12,) 1000 MCG/ML injection Inject 1 mL (1,000 mcg total) into the muscle every 30 (thirty) days. 11/24/18   Virginia Crews, MD  docusate sodium (COLACE) 100 MG capsule Take 100 mg by mouth as needed for mild constipation.    [provider]  ELDERBERRY PO Take 2  tablets by mouth daily.     [provider]  letrozole (FEMARA) 2.5 MG tablet Take 1 tablet (2.5 mg total) by mouth daily. 10/06/18   Karen Kitchens, NP  lisinopril-hydrochlorothiazide (PRINZIDE,ZESTORETIC) 20-12.5 MG tablet Take 1 tablet by mouth daily. 11/24/18   Bacigalupo, Dionne Bucy, MD  LORazepam  (ATIVAN) 0.5 MG tablet Take 1 tablet (0.5 mg total) by mouth daily as needed for anxiety. Patient not taking: Reported on 01/25/2019 11/24/18   Virginia Crews, MD  MELATONIN PO Take 10 mg by mouth at bedtime as needed.    [provider]  psyllium (METAMUCIL) 58.6 % powder Take 1 packet by mouth as needed.     [provider]    Allergies Hydroxyzine and Shrimp [shellfish allergy]  Family History  Problem Relation Age of Onset  . Diabetes Mother   . Breast cancer Mother 9  . Hypertension Mother   . Lung cancer Father        asbestos exposure  . Heart disease Brother 24  . Breast cancer Cousin 23       paternal cousin  . Heart disease Paternal Grandmother 57  . Heart disease Paternal Grandfather     Social History Social History   Tobacco Use  . Smoking status: Never Smoker  . Smokeless tobacco: Never Used  Substance Use Topics  . Alcohol use: Not Currently    Frequency: Never    Comment: alcohol dependence prior to 2007  . Drug use: Never    Review of Systems Constitutional: No fever/chills Eyes: No visual changes. ENT: No sore throat. Cardiovascular: Denies chest pain. Respiratory: Denies shortness of breath. Gastrointestinal: No abdominal pain.  No nausea, no vomiting.  No diarrhea.   Genitourinary: Negative for dysuria. Musculoskeletal: Negative for back pain. Skin: Negative for rash. Neurological: Positive for confusion ____________________________________________   PHYSICAL EXAM:  VITAL SIGNS: ED Triage Vitals  Enc Vitals Group     BP 01/30/19 2006 (!) 145/77     Pulse Rate 01/30/19 2006 78     Resp 01/30/19 2006 16     Temp 01/30/19 2006 98.2 F (36.8 C)     Temp Source 01/30/19 2006 Oral     SpO2 01/30/19 2006 100 %     Weight --      Height --      Head Circumference --      Peak Flow --      Pain Score 01/30/19 2007 0   Constitutional: Alert and oriented.  Eyes: Conjunctivae are normal.  ENT      Head:  Normocephalic and atraumatic.      Nose: No congestion/rhinnorhea.      Mouth/Throat: Mucous membranes are moist.      Neck: No stridor. Hematological/Lymphatic/Immunilogical: No cervical lymphadenopathy. Cardiovascular: Normal rate, regular rhythm.  No murmurs, rubs, or gallops.  Respiratory: Normal respiratory effort without tachypnea nor retractions. Breath sounds are clear and equal bilaterally. No wheezes/rales/rhonchi. Gastrointestinal: Soft and non tender. No rebound. No guarding.  Genitourinary: Deferred Musculoskeletal: Normal range of motion in all extremities. No lower extremity edema. Neurologic: Not completely oriented to events. . No gross focal neurologic deficits are appreciated.  Skin:  Skin is warm, dry and intact. No rash noted.  ____________________________________________    LABS (pertinent positives/negatives)  Acetaminophen, salicylate, ethanol below threshold CBC wnl CMP na 127, k 3.3, glu 102, cr 0.71  ____________________________________________   EKG  None  ____________________________________________    RADIOLOGY  None  ____________________________________________   PROCEDURES  Procedures  ____________________________________________   INITIAL IMPRESSION / ASSESSMENT AND PLAN / ED COURSE  Pertinent labs & imaging results that were available during my care of the patient were reviewed by me and considered in my medical decision making (see chart for details).   Patient presented to the emergency department today brought in by husband because of concerns for confusion and desire for psychiatric evaluation given the patient has been off her medication for quite some time.  Blood work does show a slightly low sodium.  I doubt however that this low sodium would fully explain the patient's symptoms.  Otherwise no concerning findings for other significant electrolyte abnormalities or infection.  Will give patient IV fluids and have psychiatry  evaluate.  ____________________________________________   FINAL CLINICAL IMPRESSION(S) / ED DIAGNOSES  Confusion  Note: This dictation was prepared with Dragon dictation. Any transcriptional errors that result from this process are unintentional     Nance Pear, MD 01/30/19 2308

## 2019-01-31 ENCOUNTER — Other Ambulatory Visit: Payer: Self-pay

## 2019-01-31 ENCOUNTER — Emergency Department (HOSPITAL_COMMUNITY)
Admission: EM | Admit: 2019-01-31 | Discharge: 2019-02-01 | Disposition: A | Discharge status: Home / Self Care | Payer: Medicare Other | Source: Home / Self Care | Attending: Emergency Medicine | Admitting: Emergency Medicine

## 2019-01-31 DIAGNOSIS — I10 Essential (primary) hypertension: Secondary | ICD-10-CM | POA: Insufficient documentation

## 2019-01-31 DIAGNOSIS — R4182 Altered mental status, unspecified: Secondary | ICD-10-CM

## 2019-01-31 DIAGNOSIS — G3184 Mild cognitive impairment, so stated: Secondary | ICD-10-CM

## 2019-01-31 DIAGNOSIS — F329 Major depressive disorder, single episode, unspecified: Secondary | ICD-10-CM

## 2019-01-31 DIAGNOSIS — F419 Anxiety disorder, unspecified: Secondary | ICD-10-CM | POA: Diagnosis not present

## 2019-01-31 DIAGNOSIS — Z79899 Other long term (current) drug therapy: Secondary | ICD-10-CM | POA: Insufficient documentation

## 2019-01-31 DIAGNOSIS — G4700 Insomnia, unspecified: Secondary | ICD-10-CM

## 2019-01-31 DIAGNOSIS — F1011 Alcohol abuse, in remission: Secondary | ICD-10-CM

## 2019-01-31 DIAGNOSIS — F322 Major depressive disorder, single episode, severe without psychotic features: Secondary | ICD-10-CM | POA: Diagnosis not present

## 2019-01-31 DIAGNOSIS — F331 Major depressive disorder, recurrent, moderate: Secondary | ICD-10-CM

## 2019-01-31 LAB — URINE DRUG SCREEN, QUALITATIVE (ARMC ONLY)
Amphetamines, Ur Screen: NOT DETECTED
Barbiturates, Ur Screen: NOT DETECTED
Benzodiazepine, Ur Scrn: NOT DETECTED
Cannabinoid 50 Ng, Ur ~~LOC~~: NOT DETECTED
Cocaine Metabolite,Ur ~~LOC~~: NOT DETECTED
MDMA (ECSTASY) UR SCREEN: NOT DETECTED
Methadone Scn, Ur: NOT DETECTED
Opiate, Ur Screen: NOT DETECTED
Phencyclidine (PCP) Ur S: NOT DETECTED
Tricyclic, Ur Screen: NOT DETECTED

## 2019-01-31 MED ORDER — RISPERIDONE 1 MG PO TABS
2.0000 mg | ORAL_TABLET | Freq: Once | ORAL | Status: AC
  Administered 2019-01-31: 2 mg via ORAL
  Filled 2019-01-31: qty 2

## 2019-01-31 MED ORDER — CYANOCOBALAMIN 1000 MCG/ML IJ SOLN: 1000.0000 ug | INTRAMUSCULAR | Status: DC

## 2019-01-31 MED ORDER — LISINOPRIL 10 MG PO TABS
20.0000 mg | ORAL_TABLET | Freq: Every day | ORAL | Status: DC
  Administered 2019-01-31: 20 mg via ORAL
  Filled 2019-01-31: qty 2

## 2019-01-31 MED ORDER — CYANOCOBALAMIN 1000 MCG/ML IJ SOLN
1000.0000 ug | Freq: Once | INTRAMUSCULAR | Status: AC
  Administered 2019-01-31: 1000 ug via INTRAMUSCULAR
  Filled 2019-01-31: qty 1

## 2019-01-31 MED ORDER — LETROZOLE 2.5 MG PO TABS
2.5000 mg | ORAL_TABLET | Freq: Every day | ORAL | Status: DC
  Filled 2019-01-31: qty 1

## 2019-01-31 MED ORDER — RISPERIDONE 0.25 MG PO TABS
0.2500 mg | ORAL_TABLET | Freq: Once | ORAL | Status: DC
  Filled 2019-01-31 (×2): qty 1

## 2019-01-31 MED ORDER — FLUOXETINE HCL 20 MG PO CAPS
40.0000 mg | ORAL_CAPSULE | Freq: Every day | ORAL | Status: DC
  Administered 2019-01-31: 40 mg via ORAL
  Filled 2019-01-31: qty 2

## 2019-01-31 MED ORDER — RISPERIDONE 2 MG PO TABS: 2.0000 mg | ORAL_TABLET | Freq: Every day | ORAL | 0 refills | Status: DC

## 2019-01-31 MED ORDER — LORAZEPAM 0.5 MG PO TABS: 0.5000 mg | ORAL_TABLET | Freq: Every day | ORAL | Status: DC | PRN

## 2019-01-31 MED ORDER — LISINOPRIL-HYDROCHLOROTHIAZIDE 20-12.5 MG PO TABS: 1.0000 | ORAL_TABLET | Freq: Every day | ORAL | Status: DC

## 2019-01-31 MED ORDER — FLUOXETINE HCL 20 MG PO CAPS: 20.0000 mg | ORAL_CAPSULE | Freq: Every day | ORAL | Status: DC

## 2019-01-31 MED ORDER — LETROZOLE 2.5 MG PO TABS
2.5000 mg | ORAL_TABLET | Freq: Every day | ORAL | Status: DC
  Administered 2019-01-31 – 2019-02-01 (×2): 2.5 mg via ORAL
  Filled 2019-01-31 (×2): qty 1

## 2019-01-31 MED ORDER — RISPERIDONE 1 MG PO TABS: 1.0000 mg | ORAL_TABLET | Freq: Every day | ORAL | Status: DC

## 2019-01-31 MED ORDER — RISPERIDONE 1 MG PO TABS: 2.0000 mg | ORAL_TABLET | Freq: Every day | ORAL | Status: DC

## 2019-01-31 MED ORDER — FLUOXETINE HCL 40 MG PO CAPS: 40.0000 mg | ORAL_CAPSULE | Freq: Every day | ORAL | 0 refills | Status: DC

## 2019-01-31 MED ORDER — CYANOCOBALAMIN 1000 MCG/ML IJ SOLN
1000.0000 ug | INTRAMUSCULAR | Status: DC
  Filled 2019-01-31: qty 1

## 2019-01-31 MED ORDER — HYDROCHLOROTHIAZIDE 12.5 MG PO CAPS
12.5000 mg | ORAL_CAPSULE | Freq: Every day | ORAL | Status: DC
  Filled 2019-01-31 (×2): qty 1

## 2019-01-31 MED ORDER — BUPROPION HCL ER (XL) 150 MG PO TB24: 300.0000 mg | ORAL_TABLET | Freq: Every day | ORAL | Status: DC

## 2019-01-31 NOTE — ED Notes (Signed)
Pt provided lunch tray.

## 2019-01-31 NOTE — ED Notes (Signed)
Pt provided breakfast tray.

## 2019-01-31 NOTE — Discharge Instructions (Addendum)
Low up with Dr. Shea Evans as scheduled.  Return to the ER for any new or worsening symptoms that concern you, particularly any thoughts of wanting to hurt yourself or anyone else.

## 2019-01-31 NOTE — ED Notes (Signed)
Gave pt a magazine and a book to read. Per request of the Doctor.

## 2019-01-31 NOTE — ED Provider Notes (Signed)
-----------------------------------------   5:58 AM on 01/31/2019 -----------------------------------------   Blood pressure (!) 144/76, pulse 80, temperature 98.2 F (36.8 C), temperature source Oral, resp. rate 20, SpO2 100 %.  The patient is sleeping at this time.  There have been no acute events since the last update.  Psychiatrist to see this morning and refill patient's psychiatric medications.   Paulette Blanch, MD 01/31/19 [phone]

## 2019-01-31 NOTE — ED Provider Notes (Signed)
-----------------------------------------   4:26 PM on 01/31/2019 -----------------------------------------  The patient has been evaluated by Dr. Leverne Humbles.  She recommends discharge home.  The patient is stable for discharge at this time.  Return precautions provided.   Arta Silence, MD 01/31/19 1626

## 2019-01-31 NOTE — ED Notes (Signed)
Pt sitting up in chair in darkened exam room; pt denies c/o and voices understanding of plan of care

## 2019-01-31 NOTE — ED Notes (Signed)
BEHAVIORAL HEALTH ROUNDING Patient sleeping: NO Patient alert and oriented: YES Behavior appropriate: YES Nutrition and fluids offered: YES Toileting and hygiene offered: YES Sitter present: no Event organiser present: YES

## 2019-01-31 NOTE — ED Notes (Signed)
Pt up to room commode to void, qs; sample to lab as ordered; pt remains awake, calm with no c/o

## 2019-01-31 NOTE — ED Provider Notes (Signed)
[facility] Emergency Department Provider Note ____________________________________________   First MD Initiated Contact with Patient 01/31/19 1741     (approximate)  I have reviewed the triage vital signs and the nursing notes.   HISTORY  Chief Complaint Psychiatric Evaluation    HPI Alyxis Duhon is a 73 y.o. female with PMH as noted below who presents for psychiatric evaluation.  Her chief complaint is that she is afraid of modern technology including "all of the smart technology in the house talking to each other."  The patient was just discharged from the emergency department after psychiatric evaluation and states that she cannot make it at home.  She denies any acute medical complaints.  She has no SI or HI at this time.   Past Medical History:  Diagnosis Date  . Anxiety   . Breast cancer ([place]) 03/2018   right breast  . Complication of anesthesia   . Depression   . Family history of breast cancer 03/24/2018  . Family history of lung cancer 03/24/2018  . History of alcohol dependence ([place]) 2007   no ETOH; resolved since 2007  . History of psychosis 2014   due to sleep disturbance  . Hypertension   . Personal history of radiation therapy   . PONV (postoperative nausea and vomiting)     Patient Active Problem List   Diagnosis Date Noted  . MDD (major depressive disorder), severe ([place]) 01/31/2019  . High serum carbohydrate antigen 19-9 (CA19-9) 12/01/2018  . Adjustment disorder with anxious mood 11/24/2018  . Essential hypertension 11/24/2018  . Screening for lipid disorders 11/24/2018  . Elevated LFTs 10/17/2018  . B12 deficiency 10/17/2018  . Incisional hernia, without obstruction or gangrene 09/12/2018  . Moderate recurrent major depression ([place]) 07/25/2018  . Genetic testing 04/07/2018  . Family history of breast cancer 03/24/2018  . Family history of lung cancer 03/24/2018  . Malignant neoplasm of upper-outer quadrant of right  breast in female, estrogen receptor positive ([place]) 03/17/2018  . Malignant neoplasm of upper-inner quadrant of right breast in female, estrogen receptor positive ([place]) 03/17/2018  . History of alcohol dependence ([place]) 02/07/2018  . History of psychosis 02/07/2018  . History of insomnia 02/07/2018    Past Surgical History:  Procedure Laterality Date  . BREAST LUMPECTOMY WITH RADIOACTIVE SEED AND SENTINEL LYMPH NODE BIOPSY Right 04/27/2018   Procedure: RIGHT BREAST LUMPECTOMY WITH RADIOACTIVE SEED X 2 AND RIGHT SENTINEL LYMPH NODE BIOPSY;  Surgeon: [name], MD;  Location: [place];  Service: General;  Laterality: Right;  . LAPAROTOMY N/A 02/10/2018   Procedure: EXPLORATORY LAPAROTOMY;  Surgeon: Isabel Caprice, MD;  [facility];  Service: Gynecology;  Laterality: N/A;  . MASS EXCISION  01/2018   abdominal  . OVARIAN CYST REMOVAL    . SALPINGOOPHORECTOMY Bilateral 02/10/2018   Procedure: BILATERAL SALPINGO OOPHORECTOMY; PERITONEAL WASHINGS;  Surgeon: Isabel Caprice, MD;  [facility];  Service: Gynecology;  Laterality: Bilateral;  . TONSILLECTOMY    . TONSILLECTOMY AND ADENOIDECTOMY  1953    Prior to Admission medications   Medication Sig Start Date End Date Taking? Authorizing Provider  cyanocobalamin (,VITAMIN B-12,) 1000 MCG/ML injection Inject 1 mL (1,000 mcg total) into the muscle every 30 (thirty) days. Patient not taking: Reported on 01/31/2019 11/24/18   Virginia Crews, MD  FLUoxetine (PROZAC) 40 MG capsule Take 1 capsule (40 mg total) by mouth daily. 01/31/19   Lavella Hammock, MD  letrozole [facility]) 2.5 MG tablet Take 1  tablet (2.5 mg total) by mouth daily. 10/06/18   Karen Kitchens, NP  lisinopril-hydrochlorothiazide (PRINZIDE,ZESTORETIC) 20-12.5 MG tablet Take 1 tablet by mouth daily. 11/24/18   Bacigalupo, Dionne Bucy, MD  LORazepam (ATIVAN) 0.5 MG tablet Take 1 tablet (0.5 mg total) by mouth daily as needed for anxiety. 11/24/18   Virginia Crews, MD  risperiDONE (RISPERDAL) 2 MG tablet Take 1 tablet (2 mg total) by mouth at bedtime. 01/31/19   Lavella Hammock, MD    Allergies Hydroxyzine and Shrimp [shellfish allergy]  Family History  Problem Relation Age of Onset  . Diabetes Mother   . Breast cancer Mother 42  . Hypertension Mother   . Lung cancer Father        asbestos exposure  . Heart disease Brother 1  . Breast cancer Cousin 87       paternal cousin  . Heart disease Paternal Grandmother 73  . Heart disease Paternal Grandfather     Social History Social History   Tobacco Use  . Smoking status: Never Smoker  . Smokeless tobacco: Never Used  Substance Use Topics  . Alcohol use: Not Currently    Frequency: Never    Comment: alcohol dependence prior to 2007  . Drug use: Never    Review of Systems  Constitutional: No fever. Eyes: No redness. ENT: No sore throat. Cardiovascular: Denies chest pain. Respiratory: Denies shortness of breath. Gastrointestinal: No vomiting or diarrhea.  Genitourinary: Negative for flank pain.  Musculoskeletal: Negative for back pain. Skin: Negative for rash. Neurological: Negative for headache.   ____________________________________________   PHYSICAL EXAM:  VITAL SIGNS: ED Triage Vitals  Enc Vitals Group     BP 01/31/19 1658 130/68     Pulse Rate 01/31/19 1658 64     Resp 01/31/19 1658 17     Temp 01/31/19 1658 98.7 F (37.1 C)     Temp Source 01/31/19 1658 Oral     SpO2 01/31/19 1658 100 %     Weight 01/31/19 1659 170 lb (77.1 kg)     Height 01/31/19 1659 5\' 7"  (1.702 m)     Head Circumference --      Peak Flow --      Pain Score 01/31/19 1659 0     Pain Loc --      Pain Edu? --      Excl. in [place]? --     Constitutional: Alert and oriented.  Anxious appearing but in no acute distress. Eyes: Conjunctivae are normal.  Head: Atraumatic. Nose: No congestion/rhinnorhea. Mouth/Throat: Mucous membranes are moist.   Neck: Normal range of motion.   Cardiovascular: Good peripheral circulation. Respiratory: Normal respiratory effort.   Gastrointestinal: No distention.  Musculoskeletal: Extremities warm and well perfused.  Neurologic:  Normal speech and language. No gross focal neurologic deficits are appreciated.  Skin:  Skin is warm and dry. No rash noted. Psychiatric: Flat affect.  ____________________________________________   LABS (all labs ordered are listed, but only abnormal results are displayed)  Labs Reviewed - No data to display ____________________________________________  EKG   ____________________________________________  RADIOLOGY    ____________________________________________   PROCEDURES  Procedure(s) performed: No  Procedures  Critical Care performed: No ____________________________________________   INITIAL IMPRESSION / ASSESSMENT AND PLAN / ED COURSE  Pertinent labs & imaging results that were available during my care of the patient were reviewed by me and considered in my medical decision making (see chart for details).  73 year old female with PMH as noted above presents for  reevaluation after just being discharged from the ED.  The patient was evaluated by Dr. Leverne Humbles from psychiatry and cleared for discharge with some medication changes.  However the patient never left the building, and states that she cannot make it at home.  She specifically cites concerns over Tax adviser.  She denies SI or HI.  She has no acute medical complaints.  At this time the patient's presentation is consistent with major depressive disorder with some features of possible psychosis.  She does not demonstrate acute danger to self or others at this time and does not require involuntary commitment.  I consulted Dr. Leverne Humbles from psychiatry again.  We will give the patient risperidone and observe overnight, with reassessment tomorrow.  ----------------------------------------- 11:02 PM on 01/31/2019  -----------------------------------------  The patient is pending psychiatric reassessment in the morning.  She has remained calm and cooperative throughout her ED stay so far.  I am signing her out to the oncoming physician Dr. Owens Shark. ____________________________________________   FINAL CLINICAL IMPRESSION(S) / ED DIAGNOSES  Final diagnoses:  Moderate episode of recurrent major depressive disorder (HCC)      NEW MEDICATIONS STARTED DURING THIS VISIT:  New Prescriptions   No medications on file     Note:  This document was prepared using Dragon voice recognition software and may include unintentional dictation errors.    Arta Silence, MD 01/31/19 2302

## 2019-01-31 NOTE — ED Notes (Signed)
[facility] on stretcher in darkened exam room, awake, looking around room; no distress noted, no c/o voiced; warm blanket provided for comfort

## 2019-01-31 NOTE — ED Notes (Signed)
Vol/pending psych consult. 

## 2019-01-31 NOTE — ED Notes (Signed)
Pt provided with graham crackers and [place]. Call bell within reach. No other needs expressed at this time.

## 2019-01-31 NOTE — ED Triage Notes (Signed)
Patient reports" does not feel like she can make it at home" Denies HV/HI/SI

## 2019-01-31 NOTE — ED Notes (Signed)
BEHAVIORAL HEALTH ROUNDING Patient sleeping: NO Patient alert and oriented: YES Behavior appropriate: YES Nutrition and fluids offered: YES Toileting and hygiene offered: YES Sitter present: NO Event organiser present: YES

## 2019-01-31 NOTE — ED Notes (Signed)
Pt given breakfast ray.

## 2019-01-31 NOTE — Consult Note (Addendum)
[facility] Consult   Reason for Consult:  MDD Referring Physician: Dr. Archie Balboa Patient Identification: Kendra [name] MRN:  [id number] Principal Diagnosis: MDD (major depressive disorder), severe ([place]) Diagnosis:  Principal Problem:   MDD (major depressive disorder), severe ([place])   Total Time spent with patient: 1 hour  Subjective: "I do not know if I have anyone who can help me.  I need to find the leading physicians of systems, because the [place] is the melting pot"  HPI: Kendra [name] is a 73 y.o. female who presents to the emergency department today because of concern for increased confusion and medication adjustment. The patient states she has noticed she has been having a harder time concentrating and has been more confused for the past few weeks. In addition she has noticed that she is having difficulty expressing her opinions. She says that she has been out of her medication for roughly 1 month. She denies any fevers, nausea vomiting, abdominal pain, change in urination or defecation.  Initial psychiatric intake: Kendra [name] is a 73 y.o. female patient presented to [facility] ED by her husband.Kendra [name] stating that his wife voiced that she needs help.  Kendra [name] stated that his wife is out off her medications and needs it to be refill until he sees her provider on March 01, 2019.  The patient was seen face-to-face by this provider; chart reviewed and consulted with Dr. Beather Arbour on 01/31/2019 due to the care of the patient. It was discussed with the provider that the patient does not meet criteria to be admitted to the inpatient unit. The discussion with Dr. Beather Arbour was explained to her ; that the patient husband Kendra [name]) brought Kendra [name] to the ED due to her been out of her medication for a few weeks. He stated that he is unable to refill her prescriptions until he sees her provider on March 01, 2019.  Dr. Beather Arbour expressed that she would feel comfortable if  Kendra [name] remains overnight under observation status until seen by Dr. Leverne Humbles in the morning.  Dr. Beather Arbour, discussed she is not comfortable having the patient be discharged tonight with the possibilities of her presenting with some behavior issues at home during the night. On evaluation the patient is alert and oriented x 1-2, calm and cooperative, and mood-congruent with affect.  The patient does not appear to be responding to internal or external stimuli. Neither is the patient presenting with any delusional thinking. The patient is unable to answer questions about weather or not she is suicidal, homicidal, or having self-harm thoughts. The patient is not presenting with any psychotic or paranoid behaviors. During an encounter with the patient, she was not able to answer questions appropriately.  Collateral was obtained by husband Kendra [name] [phone])  who expresses concerns for patient being out of her medication and actively voicing that she needs help.  During this provider interaction with Kendra [name] he stated that his wife was over at [facility] ED in September and she saw Dr. Weber Cooks.  He voiced that Dr. Weber Cooks had prescribed her Wellbutrin 300 mg daily, Ativan 0.5 mg as needed and risperidone 1 mg at nighttime.  Kendra [name] stated that the medication has been working very well for Kendra [name] until she ran out and has not been able to fill her prescription.  He states, she does not have any refills left on her prescription.  She was brought in today with the intention of him having her prescription refill  until he is able to take her to her March 01, 2019 psychiatric appointment.  Plan: The patient is not a safety risk to herself or others, and does not require inpatient admission.  The patient currently requires medication refills such as Wellbutrin 300 mg daily Lorazepam 0.5 mg as needed, and Risperdal 1 mg at bedtime in order to assist the patient with stabilization and  treatment.   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 Brigitte Pulse, neurology for early Alzheimer dementia.  On reevaluation, patient is found dressed and wandering in her room.  She has a blank stare and is thought blocking.  When pressed to answer why she is in the hospital, she states "I have come to find the leading physicians in systems, as the [place] is the melting pot of all the smartest physicians."  Patient states that she does have some depression and since she has been in the hospital has had some suicide thoughts.  She states that she has worry about a lot of things, and is concerned that her husband is frustrated with her and will not be able to take her home or allow her to come home, and she would not have anybody to care for her.  Patient denies any panic attack symptoms.  She states that she stopped her medications approximately a month ago, because "I was confused.  Some say to take as needed other state to take daily but I did not know what that meant."  Patient states that she is hoping to find a way to bring balance back into her life.  She currently has vague suicidal thoughts that are intermittent.  She is denying any homicidal ideation.  She denies any hallucinations, however she reports that she does not watch television and sometimes feels like she "gets signs from it when watching."  Pharmacy completed a medication reconciliation and noted that prescriptions are available at the pharmacy.  Spoke with husband regarding patient, who reports that he has had concerns about patient's "disorganized and chaotic thoughts."  He notes that she is slow to respond to him and make statements which are nonsensical.  He thought patient had stopped medications due to refills not being available, but then found that patient had been hiding medications.  When he asked why she had stopped taking  them, he states she told him that "I was afraid I would run out."  Has been denies that patient has any history of self injury or suicide attempts.  He describes patient has a history of alcoholism but has abstained from alcohol for the past 15 years.  He states that she had completed a 30-day inpatient therapy prior to her sobriety.  She did have a psychiatric hospitalization in 2012 due to anxiety related to her abstaining from alcohol.  18 months ago he moved to [place] (keep residence in both states) and was noted to have an abdominal mass as well as breast cancer for which she has been treated.  Patient did have a MRI of her brain in October 2019 which was indicative of age-related changes, and did not indicate any cause of acute changes of behavior.  Risk to Self:  No Risk to Others:  No Prior Inpatient Therapy:  Yes Prior Outpatient Therapy:  Yes  Past Medical History:  Past Medical History:  Diagnosis Date  . Anxiety   . Breast cancer ([place]) 03/2018   right breast  .  Complication of anesthesia   . Depression   . Family history of breast cancer 03/24/2018  . Family history of lung cancer 03/24/2018  . History of alcohol dependence ([place]) 2007   no ETOH; resolved since 2007  . History of psychosis 2014   due to sleep disturbance  . Hypertension   . Personal history of radiation therapy   . PONV (postoperative nausea and vomiting)     Past Surgical History:  Procedure Laterality Date  . BREAST LUMPECTOMY WITH RADIOACTIVE SEED AND SENTINEL LYMPH NODE BIOPSY Right 04/27/2018   Procedure: RIGHT BREAST LUMPECTOMY WITH RADIOACTIVE SEED X 2 AND RIGHT SENTINEL LYMPH NODE BIOPSY;  Surgeon: [name], MD;  Location: [facility];  Service: General;  Laterality: Right;  . LAPAROTOMY N/A 02/10/2018   Procedure: EXPLORATORY LAPAROTOMY;  Surgeon: Isabel Caprice, MD;  Location: [facility];  Service: Gynecology;  Laterality: N/A;  . MASS EXCISION  01/2018   abdominal  .  OVARIAN CYST REMOVAL    . SALPINGOOPHORECTOMY Bilateral 02/10/2018   Procedure: BILATERAL SALPINGO OOPHORECTOMY; PERITONEAL WASHINGS;  Surgeon: Isabel Caprice, MD;  Location: [facility];  Service: Gynecology;  Laterality: Bilateral;  . TONSILLECTOMY    . TONSILLECTOMY AND ADENOIDECTOMY  1953   Family History:  Family History  Problem Relation Age of Onset  . Diabetes Mother   . Breast cancer Mother 28  . Hypertension Mother   . Lung cancer Father        asbestos exposure  . Heart disease Brother 32  . Breast cancer Cousin 67       paternal cousin  . Heart disease Paternal Grandmother 64  . Heart disease Paternal Grandfather    Family Psychiatric  History:History reviewed. No pertinent family history Social History:  Social History   Substance and Sexual Activity  Alcohol Use Not Currently  . Frequency: Never   Comment: alcohol dependence prior to 2007     Social History   Substance and Sexual Activity  Drug Use Never    Social History   Socioeconomic History  . Marital status: Married    Spouse name: Not on file  . Number of children: 0  . Years of education: Not on file  . Highest education level: Not on file  Occupational History  . Occupation: retired Furniture conservator/restorer of education  Social Needs  . Financial resource strain: Not on file  . Food insecurity:    Worry: Not on file    Inability: Not on file  . Transportation needs:    Medical: Not on file    Non-medical: Not on file  Tobacco Use  . Smoking status: Never Smoker  . Smokeless tobacco: Never Used  Substance and Sexual Activity  . Alcohol use: Not Currently    Frequency: Never    Comment: alcohol dependence prior to 2007  . Drug use: Never  . Sexual activity: Yes    Partners: Male    Birth control/protection: Surgical  Lifestyle  . Physical activity:    Days per week: Not on file    Minutes per session: Not on file  . Stress: Not on file  Relationships  . Social connections:     Talks on phone: Not on file    Gets together: Not on file    Attends religious service: Not on file    Active member of club or organization: Not on file    Attends meetings of clubs or organizations: Not on file  Relationship status: Not on file  Other Topics Concern  . Not on file  Social History Narrative  . Not on file   Additional Social History:  Lives alone with husband.  Husband states that his wife does have some difficulty sleeping and awakes frequently through the night with apparent night terrors.  He reports that they do still shared that he has not been harmed, and that touch appears to be soothing to her.  Allergies:   Allergies  Allergen Reactions  . Hydroxyzine Anaphylaxis    Tongue swollen  . Shrimp [Shellfish Allergy] Other (See Comments)    Food poisoning     Labs:  Results for orders placed or performed during the hospital encounter of 01/30/19 (from the past 48 hour(s))  Comprehensive metabolic panel     Status: Abnormal   Collection Time: 01/30/19  8:08 PM  Result Value Ref Range   Sodium 127 (L) 135 - 145 mmol/L   Potassium 3.3 (L) 3.5 - 5.1 mmol/L   Chloride 88 (L) 98 - 111 mmol/L   CO2 24 22 - 32 mmol/L   Glucose, Bld 102 (H) 70 - 99 mg/dL   BUN 12 8 - 23 mg/dL   Creatinine, Ser 0.71 0.44 - 1.00 mg/dL   Calcium 9.9 8.9 - 10.3 mg/dL   Total Protein 7.3 6.5 - 8.1 g/dL   Albumin 5.0 3.5 - 5.0 g/dL   AST 31 15 - 41 U/L   ALT 23 0 - 44 U/L   Alkaline Phosphatase 48 38 - 126 U/L   Total Bilirubin 1.0 0.3 - 1.2 mg/dL   GFR calc non Af Amer >60 >60 mL/min   GFR calc Af Amer >60 >60 mL/min   Anion gap 15 5 - 15    Comment: Performed at [facility], [address] [place], [place] [address]  Ethanol     Status: None   Collection Time: 01/30/19  8:08 PM  Result Value Ref Range   Alcohol, Ethyl (B) <10 <10 mg/dL    Comment: (NOTE) Lowest detectable limit for serum alcohol is 10 mg/dL. For medical purposes only. Performed at [facility], [address] [place], [place] [address]   Salicylate level     Status: None   Collection Time: 01/30/19  8:08 PM  Result Value Ref Range   Salicylate Lvl <0.9 2.8 - 30.0 mg/dL    Comment: Performed at [facility], [address] [place], [place] [address]  Acetaminophen level     Status: Abnormal   Collection Time: 01/30/19  8:08 PM  Result Value Ref Range   Acetaminophen (Tylenol), Serum <10 (L) 10 - 30 ug/mL    Comment: (NOTE) Therapeutic concentrations vary significantly. A range of 10-30 ug/mL  may be an effective concentration for many patients. However, some  are best treated at concentrations outside of this range. Acetaminophen concentrations >150 ug/mL at 4 hours after ingestion  and >50 ug/mL at 12 hours after ingestion are often associated with  toxic reactions. Performed at [facility], [address] [place], [place] [address]   cbc     Status: None   Collection Time: 01/30/19  8:08 PM  Result Value Ref Range   WBC 5.9 4.0 - 10.5 K/uL   RBC 5.06 3.87 - 5.11 MIL/uL   Hemoglobin 14.5 12.0 - 15.0 g/dL   HCT 42.1 36.0 - 46.0 %   MCV 83.2 80.0 - 100.0 fL   MCH 28.7 26.0 - 34.0 pg  MCHC 34.4 30.0 - 36.0 g/dL   RDW 13.2 11.5 - 15.5 %   Platelets 248 150 - 400 K/uL   nRBC 0.0 0.0 - 0.2 %    Comment: Performed at [facility], [address] [place], [place] [address]  Urine Drug Screen, Qualitative     Status: None   Collection Time: 01/31/19 12:43 AM  Result Value Ref Range   Tricyclic, Ur Screen NONE DETECTED NONE DETECTED   Amphetamines, Ur Screen NONE DETECTED NONE DETECTED   MDMA (Ecstasy)Ur Screen NONE DETECTED NONE DETECTED   Cocaine Metabolite,Ur [place] NONE DETECTED NONE DETECTED   Opiate, Ur Screen NONE DETECTED NONE DETECTED   Phencyclidine (PCP) Ur S NONE DETECTED NONE DETECTED   Cannabinoid 50 Ng, Ur [place] NONE DETECTED NONE DETECTED   Barbiturates, Ur Screen NONE DETECTED NONE DETECTED   Benzodiazepine,  Ur Scrn NONE DETECTED NONE DETECTED   Methadone Scn, Ur NONE DETECTED NONE DETECTED    Comment: (NOTE) Tricyclics + metabolites, urine    Cutoff 1000 ng/mL Amphetamines + metabolites, urine  Cutoff 1000 ng/mL MDMA (Ecstasy), urine              Cutoff 500 ng/mL Cocaine Metabolite, urine          Cutoff 300 ng/mL Opiate + metabolites, urine        Cutoff 300 ng/mL Phencyclidine (PCP), urine         Cutoff 25 ng/mL Cannabinoid, urine                 Cutoff 50 ng/mL Barbiturates + metabolites, urine  Cutoff 200 ng/mL Benzodiazepine, urine              Cutoff 200 ng/mL Methadone, urine                   Cutoff 300 ng/mL The urine drug screen provides only a preliminary, unconfirmed analytical test result and should not be used for non-medical purposes. Clinical consideration and professional judgment should be applied to any positive drug screen result due to possible interfering substances. A more specific alternate chemical method must be used in order to obtain a confirmed analytical result. Gas chromatography / mass spectrometry (GC/MS) is the preferred confirmat ory method. Performed at [facility], [address] [place], [place] [address]     No current facility-administered medications for this encounter.    Current Outpatient Medications  Medication Sig Dispense Refill  . cyanocobalamin (,VITAMIN B-12,) 1000 MCG/ML injection Inject 1 mL (1,000 mcg total) into the muscle every 30 (thirty) days. (Patient not taking: Reported on 01/30/2019) 3 mL 3  . docusate sodium (COLACE) 100 MG capsule Take 100 mg by mouth as needed for mild constipation.    Marland Kitchen ELDERBERRY PO Take 2 tablets by mouth daily.     Marland Kitchen letrozole (FEMARA) 2.5 MG tablet Take 1 tablet (2.5 mg total) by mouth daily. (Patient not taking: Reported on 01/30/2019) 90 tablet 3  . lisinopril-hydrochlorothiazide (PRINZIDE,ZESTORETIC) 20-12.5 MG tablet Take 1 tablet by mouth daily. (Patient not taking: Reported on  01/30/2019) 90 tablet 3  . LORazepam (ATIVAN) 0.5 MG tablet Take 1 tablet (0.5 mg total) by mouth daily as needed for anxiety. (Patient not taking: Reported on 01/25/2019) 30 tablet 2  . MELATONIN PO Take 10 mg by mouth at bedtime as needed.    . psyllium (METAMUCIL) 58.6 % powder Take 1 packet by mouth as needed.       Musculoskeletal: Strength & Muscle Tone:  decreased Gait & Station: normal Patient leans: N/A  Psychiatric Specialty Exam:  Physical Exam  Nursing note and vitals reviewed. Constitutional: She appears well-developed and well-nourished. No distress.  HENT:  Head: Normocephalic and atraumatic.  Eyes: Conjunctivae and EOM are normal.  Neck: Normal range of motion.  Cardiovascular: Normal rate and regular rhythm.  Respiratory: Effort normal. No respiratory distress.  Musculoskeletal: Normal range of motion.  Neurological: She is alert.  Disoriented  Review of Systems  Constitutional: Negative.   HENT: Negative.   Eyes: Negative.   Respiratory: Negative.   Cardiovascular: Negative.   Gastrointestinal: Negative.   Genitourinary: Negative.   Musculoskeletal: Negative.   Skin: Negative.   Neurological: Negative.   Endo/Heme/Allergies: Negative.   Psychiatric/Behavioral: Positive for depression and memory loss. Negative for hallucinations, substance abuse and suicidal ideas. The patient is nervous/anxious and has insomnia.     Blood pressure (!) 156/94, pulse 84, temperature 98.2 F (36.8 C), temperature source Oral, resp. rate 20, SpO2 100 %.There is no height or weight on file to calculate BMI.  General Appearance: Fairly Groomed  Eye Contact:  Minimal  Speech:  Blocked and Slow  Volume:  Decreased  Mood:  Depressed and Hopeless  Affect:  Blunt and Depressed  Thought Process:  Disorganized  Orientation:  Other:  She knows her name and year, but unable to provide much more information as to why she is here.  Thought Content:  Unable to verbalized much to provider   Suicidal Thoughts:  No  Homicidal Thoughts:  No  Memory:  Recent;   Poor  Judgement:  Impaired  Insight:  Lacking  Psychomotor Activity:  Decreased  Concentration:  Concentration: Good  Recall:  Poor  Fund of Knowledge:  Poor  Language:  Fair  Akathisia:  NA  Handed:  Right  AIMS (if indicated):     Assets:  Desire for Improvement Others:  Medication refills  ADL's:  Intact  Cognition:  Impaired,  Mild  Sleep:   Insomnia     Treatment Plan Summary: Medication management  After review of patient's symptoms with husband, suspect that patient has developed major depressive disorder with psychotic features.  We will make changes to medications as below, and inform Dr. Shea Evans of these changes in order for her to be aware prior to their establish psychiatric intake appointment on March 01, 2019.  Husband believes that he is able to maintain patient safety.  There are no guns in the home.  Disposition: No evidence of imminent risk to self or others at present.   Patient does not meet criteria for psychiatric inpatient admission. Discussed crisis plan, support from social network, calling 911, coming to the Emergency Department, and calling Suicide Hotline.   She was able to engage in safety planning including plan to return to nearest emergency room or contact emergency services if she feels unable to maintain her own safety or the safety of others. Patient had no further questions, comments, or concerns.  Discharge into care of husband, who agrees to maintain patient safety.   Lavella Hammock, MD 01/31/2019 12:44 PM

## 2019-01-31 NOTE — ED Notes (Signed)
Patient discharged to home. Awaiting husband in waiting room. Patient went and checked herself back in to be re-evaluated. patient reports does not feel like she can make it at home with out the help of [place] nurses. Patient denies SI/HV/HI. Awaiting md eval and further plan of care.

## 2019-01-31 NOTE — ED Notes (Signed)
Assumed care of patient. Vss. Denies pain or concerns. Patient awaiting disposition after psych eval. Scheduled meds given. Patient took medications with out any concerns or problems. Calm and cooperative at present time. Sitting in chair in the room. Will continue to monitor.

## 2019-01-31 NOTE — ED Triage Notes (Signed)
Pt was discharged less than 30 min ago. Pt states she does not think she can manage at home and "needs help".

## 2019-01-31 NOTE — Consult Note (Signed)
[facility] Face-to-Face Psychiatry Consult   Reason for Consult:  MDD Referring Physician: Dr. Archie Balboa Patient Identification: Kendra [name] MRN:  [id number] Principal Diagnosis: MDD (major depressive disorder), severe ([place]) Diagnosis:  Principal Problem:   MDD (major depressive disorder), severe ([place])   Total Time spent with patient: 1 hour  Subjective:   Kendra [name] is a 73 y.o. female patient presented to [facility] ED by her husband.[name] stating that his wife voiced that she needs help.  Mr. Spainhower stated that his wife is out off her medications and needs it to be refill until he sees her provider on March 01, 2019.  The patient was seen face-to-face by this provider; chart reviewed and consulted with Dr. Beather Arbour on 01/31/2019 due to the care of the patient. It was discussed with the provider that the patient does not meet criteria to be admitted to the inpatient unit. The discussion with Dr. Beather Arbour was explained to her ; that the patient husband Kendra [name]) brought [name] to the ED due to her been out of her medication for a few weeks. He stated that he is unable to refill her prescriptions until he sees her provider on March 01, 2019.  Dr. Beather Arbour expressed that she would feel comfortable if [name] remains overnight under observation status until seen by Dr. Leverne Humbles in the morning.  Dr. Beather Arbour, discussed she is not comfortable having the patient be discharged tonight with the possibilities of her presenting with some behavior issues at home during the night. On evaluation the patient is alert and oriented x 1-2, calm and cooperative, and mood-congruent with affect.  The patient does not appear to be responding to internal or external stimuli. Neither is the patient presenting with any delusional thinking. The patient is unable to answer questions about weather or not she is suicidal, homicidal, or having self-harm thoughts. The patient is not presenting with any psychotic or  paranoid behaviors. During an encounter with the patient, she was not able to answer questions appropriately.  Collateral was obtained by husband Meckenzie Balsley [phone])  who expresses concerns for patient being out of her medication and actively voicing that she needs help.  During this provider interaction with Mr. Huettner he stated that his wife was over at [facility] ED in September and she saw Dr. Weber Cooks.  He voiced that Dr. Weber Cooks had prescribed her Wellbutrin 300 mg daily, Ativan 0.5 mg as needed and risperidone 1 mg at nighttime.  Mr. Klare stated that the medication has been working very well for [name] until she ran out and has not been able to fill her prescription.  He states, she does not have any refills left on her prescription.  She was brought in today with the intention of him having her prescription refill until he is able to take her to her March 01, 2019 psychiatric appointment.    Plan: The patient is not a safety risk to herself or others, and does not require inpatient admission.  The patient currently requires medication refills such as Wellbutrin 300 mg daily Lorazepam 0.5 mg as needed, and Risperdal 1 mg at bedtime in order to assist the patient with stabilization and treatment.  HPI: Per Dr. Archie Balboa; Kendra [name] is a 73 y.o. female who presents to the emergency department today because of concern for increased confusion and medication adjustment. The patient states she has noticed she has been having a harder time concentrating and has been more confused for the past few  weeks. In addition she has noticed that she is having difficulty expressing her opinions. She says that she has been out of her medication for roughly 1 month. She denies any fevers, nausea vomiting, abdominal pain, change in urination or defecation.  Past Psychiatric History:  Anxiety Depression History off alcohol dependence ([place]) History of psychosis due to sleep disturbance  Risk to  Self:  No Risk to Others:  No Prior Inpatient Therapy:  Yes Prior Outpatient Therapy:  Yes  Past Medical History:  Past Medical History:  Diagnosis Date  . Anxiety   . Breast cancer ([place]) 03/2018   right breast  . Complication of anesthesia   . Depression   . Family history of breast cancer 03/24/2018  . Family history of lung cancer 03/24/2018  . History of alcohol dependence ([place]) 2007   no ETOH; resolved since 2007  . History of psychosis 2014   due to sleep disturbance  . Hypertension   . Personal history of radiation therapy   . PONV (postoperative nausea and vomiting)     Past Surgical History:  Procedure Laterality Date  . BREAST LUMPECTOMY WITH RADIOACTIVE SEED AND SENTINEL LYMPH NODE BIOPSY Right 04/27/2018   Procedure: RIGHT BREAST LUMPECTOMY WITH RADIOACTIVE SEED X 2 AND RIGHT SENTINEL LYMPH NODE BIOPSY;  Surgeon: [name], MD;  Location: [place];  Service: General;  Laterality: Right;  . LAPAROTOMY N/A 02/10/2018   Procedure: EXPLORATORY LAPAROTOMY;  Surgeon: Isabel Caprice, MD;  Location: [facility];  Service: Gynecology;  Laterality: N/A;  . MASS EXCISION  01/2018   abdominal  . OVARIAN CYST REMOVAL    . SALPINGOOPHORECTOMY Bilateral 02/10/2018   Procedure: BILATERAL SALPINGO OOPHORECTOMY; PERITONEAL WASHINGS;  Surgeon: Isabel Caprice, MD;  Location: [facility];  Service: Gynecology;  Laterality: Bilateral;  . TONSILLECTOMY    . TONSILLECTOMY AND ADENOIDECTOMY  1953   Family History:  Family History  Problem Relation Age of Onset  . Diabetes Mother   . Breast cancer Mother 81  . Hypertension Mother   . Lung cancer Father        asbestos exposure  . Heart disease Brother 59  . Breast cancer Cousin 74       paternal cousin  . Heart disease Paternal Grandmother 59  . Heart disease Paternal Grandfather    Family Psychiatric  History:History reviewed. No pertinent family history Social History:  Social History   Substance and Sexual  Activity  Alcohol Use Not Currently  . Frequency: Never   Comment: alcohol dependence prior to 2007     Social History   Substance and Sexual Activity  Drug Use Never    Social History   Socioeconomic History  . Marital status: Married    Spouse name: Not on file  . Number of children: 0  . Years of education: Not on file  . Highest education level: Not on file  Occupational History  . Occupation: retired Furniture conservator/restorer of education  Social Needs  . Financial resource strain: Not on file  . Food insecurity:    Worry: Not on file    Inability: Not on file  . Transportation needs:    Medical: Not on file    Non-medical: Not on file  Tobacco Use  . Smoking status: Never Smoker  . Smokeless tobacco: Never Used  Substance and Sexual Activity  . Alcohol use: Not Currently    Frequency: Never    Comment: alcohol dependence prior to 2007  .  Drug use: Never  . Sexual activity: Yes    Partners: Male    Birth control/protection: Surgical  Lifestyle  . Physical activity:    Days per week: Not on file    Minutes per session: Not on file  . Stress: Not on file  Relationships  . Social connections:    Talks on phone: Not on file    Gets together: Not on file    Attends religious service: Not on file    Active member of club or organization: Not on file    Attends meetings of clubs or organizations: Not on file    Relationship status: Not on file  Other Topics Concern  . Not on file  Social History Narrative  . Not on file   Additional Social History:    Allergies:   Allergies  Allergen Reactions  . Hydroxyzine Anaphylaxis    Tongue swollen  . Shrimp [Shellfish Allergy] Other (See Comments)    Food poisoning     Labs:  Results for orders placed or performed during the hospital encounter of 01/30/19 (from the past 48 hour(s))  Comprehensive metabolic panel     Status: Abnormal   Collection Time: 01/30/19  8:08 PM  Result Value Ref Range   Sodium  127 (L) 135 - 145 mmol/L   Potassium 3.3 (L) 3.5 - 5.1 mmol/L   Chloride 88 (L) 98 - 111 mmol/L   CO2 24 22 - 32 mmol/L   Glucose, Bld 102 (H) 70 - 99 mg/dL   BUN 12 8 - 23 mg/dL   Creatinine, Ser 0.71 0.44 - 1.00 mg/dL   Calcium 9.9 8.9 - 10.3 mg/dL   Total Protein 7.3 6.5 - 8.1 g/dL   Albumin 5.0 3.5 - 5.0 g/dL   AST 31 15 - 41 U/L   ALT 23 0 - 44 U/L   Alkaline Phosphatase 48 38 - 126 U/L   Total Bilirubin 1.0 0.3 - 1.2 mg/dL   GFR calc non Af Amer >60 >60 mL/min   GFR calc Af Amer >60 >60 mL/min   Anion gap 15 5 - 15    Comment: Performed at [facility], [address] [place], [place] [address]  Ethanol     Status: None   Collection Time: 01/30/19  8:08 PM  Result Value Ref Range   Alcohol, Ethyl (B) <10 <10 mg/dL    Comment: (NOTE) Lowest detectable limit for serum alcohol is 10 mg/dL. For medical purposes only. Performed at [facility], [address] [place], [place] [address]   Salicylate level     Status: None   Collection Time: 01/30/19  8:08 PM  Result Value Ref Range   Salicylate Lvl <0.0 2.8 - 30.0 mg/dL    Comment: Performed at [facility], [address] [place], [place] [address]  Acetaminophen level     Status: Abnormal   Collection Time: 01/30/19  8:08 PM  Result Value Ref Range   Acetaminophen (Tylenol), Serum <10 (L) 10 - 30 ug/mL    Comment: (NOTE) Therapeutic concentrations vary significantly. A range of 10-30 ug/mL  may be an effective concentration for many patients. However, some  are best treated at concentrations outside of this range. Acetaminophen concentrations >150 ug/mL at 4 hours after ingestion  and >50 ug/mL at 12 hours after ingestion are often associated with  toxic reactions. Performed at [facility], [address] [place], [place] [address]   cbc     Status: None  Collection Time: 01/30/19  8:08 PM  Result Value Ref Range   WBC 5.9 4.0 - 10.5 K/uL   RBC 5.06 3.87 - 5.11 MIL/uL    Hemoglobin 14.5 12.0 - 15.0 g/dL   HCT 42.1 36.0 - 46.0 %   MCV 83.2 80.0 - 100.0 fL   MCH 28.7 26.0 - 34.0 pg   MCHC 34.4 30.0 - 36.0 g/dL   RDW 13.2 11.5 - 15.5 %   Platelets 248 150 - 400 K/uL   nRBC 0.0 0.0 - 0.2 %    Comment: Performed at [facility], [address] [place], [place] [address]    No current facility-administered medications for this encounter.    Current Outpatient Medications  Medication Sig Dispense Refill  . cyanocobalamin (,VITAMIN B-12,) 1000 MCG/ML injection Inject 1 mL (1,000 mcg total) into the muscle every 30 (thirty) days. (Patient not taking: Reported on 01/30/2019) 3 mL 3  . docusate sodium (COLACE) 100 MG capsule Take 100 mg by mouth as needed for mild constipation.    Marland Kitchen ELDERBERRY PO Take 2 tablets by mouth daily.     Marland Kitchen letrozole (FEMARA) 2.5 MG tablet Take 1 tablet (2.5 mg total) by mouth daily. (Patient not taking: Reported on 01/30/2019) 90 tablet 3  . lisinopril-hydrochlorothiazide (PRINZIDE,ZESTORETIC) 20-12.5 MG tablet Take 1 tablet by mouth daily. (Patient not taking: Reported on 01/30/2019) 90 tablet 3  . LORazepam (ATIVAN) 0.5 MG tablet Take 1 tablet (0.5 mg total) by mouth daily as needed for anxiety. (Patient not taking: Reported on 01/25/2019) 30 tablet 2  . MELATONIN PO Take 10 mg by mouth at bedtime as needed.    . psyllium (METAMUCIL) 58.6 % powder Take 1 packet by mouth as needed.       Musculoskeletal: Strength & Muscle Tone: decreased Gait & Station: normal Patient leans: N/A  Psychiatric Specialty Exam: Physical Exam  Nursing note and vitals reviewed. Constitutional: She appears well-developed and well-nourished.  HENT:  Head: Normocephalic and atraumatic.  Eyes: Pupils are equal, round, and reactive to light. Conjunctivae and EOM are normal.  Neck: Normal range of motion. Neck supple.  Cardiovascular: Normal rate and regular rhythm.  Respiratory: Effort normal and breath sounds normal.  GI: Soft. Bowel sounds  are normal.  Musculoskeletal: Normal range of motion.  Neurological: She is alert.  Skin: Skin is warm and dry.    Review of Systems  Constitutional: Negative.   HENT: Negative.   Eyes: Negative.   Respiratory: Negative.   Cardiovascular: Negative.   Gastrointestinal: Negative.   Genitourinary: Negative.   Musculoskeletal: Negative.   Skin: Negative.   Neurological: Negative.   Endo/Heme/Allergies: Negative.   Psychiatric/Behavioral: Positive for depression and memory loss. Negative for hallucinations, substance abuse and suicidal ideas. The patient is nervous/anxious and has insomnia.     Blood pressure (!) 153/100, pulse 72, temperature 98.2 F (36.8 C), temperature source Oral, resp. rate 18, SpO2 100 %.There is no height or weight on file to calculate BMI.  General Appearance: Fairly Groomed  Eye Contact:  Minimal  Speech:  Blocked and Slow  Volume:  Decreased  Mood:  Depressed and Hopeless  Affect:  Blunt and Depressed  Thought Process:  Disorganized  Orientation:  Other:  She knows her name and year, but unable to provide much more information as to why she is here.  Thought Content:  Unable to verbalized much to provider  Suicidal Thoughts:  No  Homicidal Thoughts:  No  Memory:  Recent;   Poor  Judgement:  Impaired  Insight:  Lacking  Psychomotor Activity:  Decreased  Concentration:  Concentration: Good  Recall:  Poor  Fund of Knowledge:  Poor  Language:  Fair  Akathisia:  NA  Handed:  Right  AIMS (if indicated):     Assets:  Desire for Improvement Others:  Medication refills  ADL's:  Intact  Cognition:  Impaired,  Mild  Sleep:   Insomnia     Treatment Plan Summary: Medication management and Plan To refill patient prescription until she sees her doctor on March 01, 2019  Disposition: No evidence of imminent risk to self or others at present.   Patient does not meet criteria for psychiatric inpatient admission. Discussed crisis plan, support from social  network, calling 911, coming to the Emergency Department, and calling Suicide Hotline.  Lamont Dowdy, NP 01/31/2019 12:40 AM

## 2019-01-31 NOTE — ED Notes (Signed)
Psychiatry to bedside at this time.

## 2019-01-31 NOTE — ED Notes (Signed)
Pt provided phone per request to call spouse.

## 2019-01-31 NOTE — ED Notes (Signed)
Pt uprite on stretcher in darkened exam room, eyes open with no distress noted; pt with no c/o voiced

## 2019-01-31 NOTE — ED Notes (Signed)
Report received from Janette, RN. Pt care assumed at this time.  

## 2019-01-31 NOTE — ED Notes (Signed)
Med given patient tolorated will IM shot to right upper arm.

## 2019-02-01 DIAGNOSIS — F323 Major depressive disorder, single episode, severe with psychotic features: Secondary | ICD-10-CM

## 2019-02-01 MED ORDER — LISINOPRIL-HYDROCHLOROTHIAZIDE 20-12.5 MG PO TABS: 1.0000 | ORAL_TABLET | Freq: Every day | ORAL | Status: DC

## 2019-02-01 MED ORDER — RISPERIDONE 1 MG PO TABS: 3.0000 mg | ORAL_TABLET | Freq: Every day | ORAL | Status: DC

## 2019-02-01 MED ORDER — RISPERIDONE 1 MG PO TABS
1.0000 mg | ORAL_TABLET | Freq: Once | ORAL | Status: AC
  Administered 2019-02-01: 1 mg via ORAL
  Filled 2019-02-01: qty 1

## 2019-02-01 MED ORDER — FLUOXETINE HCL 20 MG PO CAPS
40.0000 mg | ORAL_CAPSULE | Freq: Every day | ORAL | Status: DC
  Administered 2019-02-01: 13:00:00 40 mg via ORAL
  Filled 2019-02-01: qty 2

## 2019-02-01 MED ORDER — RISPERIDONE 3 MG PO TABS: 3.0000 mg | ORAL_TABLET | Freq: Every day | ORAL | 0 refills | Status: DC

## 2019-02-01 MED ORDER — LISINOPRIL 10 MG PO TABS
20.0000 mg | ORAL_TABLET | Freq: Every day | ORAL | Status: DC
  Administered 2019-02-01: 20 mg via ORAL
  Filled 2019-02-01: qty 2

## 2019-02-01 MED ORDER — HYDROCHLOROTHIAZIDE 12.5 MG PO CAPS
12.5000 mg | ORAL_CAPSULE | Freq: Every day | ORAL | Status: DC
  Administered 2019-02-01: 12.5 mg via ORAL
  Filled 2019-02-01: qty 1

## 2019-02-01 MED ORDER — RISPERIDONE 1 MG PO TABS: 2.0000 mg | ORAL_TABLET | Freq: Every day | ORAL | Status: DC

## 2019-02-01 NOTE — ED Notes (Signed)
Pt observed with no unusual behavior - sitting up on the side of the bed   Appropriate to stimulation  No verbalized needs or concerns at this time  NAD assessed  Continue to monitor

## 2019-02-01 NOTE — ED Notes (Signed)
Vol/Pending placement  

## 2019-02-01 NOTE — ED Notes (Signed)
Hourly rounding reveals patient in room. No complaints, stable, in no acute distress. Q15 minute rounds and monitoring via Rover and Officer to continue.   

## 2019-02-01 NOTE — ED Notes (Signed)
Hourly rounding reveals patient sleeping in room. No complaints, stable, in no acute distress. Q15 minute rounds and monitoring via Rover and Officer to continue.  

## 2019-02-01 NOTE — ED Notes (Signed)
ED  Is the patient under IVC or is there intent for IVC:  no Is the patient medically cleared: Yes.   Is there vacancy in the ED BHU: Yes.   Is the population mix appropriate for patient: Yes.   Is the patient awaiting placement in inpatient or outpatient setting: Yes.   - per EDP note  Pt awaiting reassessment this am from psychiatry  Then if cleared husband must arrive prior to discharge  Has the patient had a psychiatric consult:  Survey of unit performed for contraband, proper placement and condition of furniture, tampering with fixtures in bathroom, shower, and each patient room: Yes.  ; Findings:  APPEARANCE/BEHAVIOR Calm and cooperative NEURO ASSESSMENT Orientation: oriented to person and place  Denies pain Hallucinations: No.None noted (Hallucinations) denies Speech: Normal Gait: normal RESPIRATORY ASSESSMENT Even  Unlabored respirations  CARDIOVASCULAR ASSESSMENT Pulses equal   regular rate  Skin warm and dry   GASTROINTESTINAL ASSESSMENT no GI complaint EXTREMITIES Full ROM  PLAN OF CARE Provide calm/safe environment. Vital signs assessed twice daily. ED BHU Assessment once each 12-hour shift. Collaborate with TTS daily or as condition indicates. Assure the ED provider has rounded once each shift. Provide and encourage hygiene. Provide redirection as needed. Assess for escalating behavior; address immediately and inform ED provider.  Assess family dynamic and appropriateness for visitation as needed: Yes.  ; If necessary, describe findings:  Educate the patient/family about BHU procedures/visitation: Yes.  ; If necessary, describe findings:

## 2019-02-01 NOTE — ED Notes (Signed)
BEHAVIORAL HEALTH ROUNDING Patient sleeping: No. Patient alert : yes Behavior appropriate: Yes.  ; If no, describe:  Nutrition and fluids offered: yes Toileting and hygiene offered: Yes  Sitter present: q15 minute observations and security monitoring Law enforcement present: Yes  ODS  

## 2019-02-01 NOTE — ED Notes (Addendum)
BEHAVIORAL HEALTH ROUNDING Patient sleeping: No. Patient alert : yes Behavior appropriate: Yes.  ; If no, describe:  Nutrition and fluids offered: yes Toileting and hygiene offered: Yes  Sitter present: q15 minute observations and security monitoring Law enforcement present: Yes  ODS  

## 2019-02-01 NOTE — ED Notes (Signed)
BEHAVIORAL HEALTH ROUNDING Patient sleeping: No. Patient alert and oriented: yes Behavior appropriate: Yes.  ; If no, describe:  Nutrition and fluids offered: yes Toileting and hygiene offered: Yes  Sitter present: q15 minute observations and security  monitoring Law enforcement present: Yes  ODS  

## 2019-02-01 NOTE — ED Notes (Signed)
BEHAVIORAL HEALTH ROUNDING Patient sleeping: No. Patient alert: yes Behavior appropriate: Yes.  ; If no, describe:  Nutrition and fluids offered: yes Toileting and hygiene offered: Yes  Sitter present: q15 minute observations and security monitoring Law enforcement present: Yes  ODS  ENVIRONMENTAL ASSESSMENT Potentially harmful objects out of patient reach: Yes.   Personal belongings secured: Yes.   Patient dressed in hospital provided attire only: Yes.   Plastic bags out of patient reach: Yes.   Patient care equipment (cords, cables, call bells, lines, and drains) shortened, removed, or accounted for: Yes.   Equipment and supplies removed from bottom of stretcher: Yes.   Potentially toxic materials out of patient reach: Yes.   Sharps container removed or out of patient reach: Yes.    

## 2019-02-01 NOTE — Consult Note (Signed)
[facility] Face-to-Face Psychiatry Consult   Reason for Consult:  MDD Referring Physician: Dr. Archie Balboa Patient Identification: Kendra [name] MRN:  [id number] Principal Diagnosis: <principal problem not specified> Diagnosis:  Active Problems:   * No active hospital problems. *   Total Time spent with patient: 45 minutes  Subjective: "I am still very confused."  HPI: Kendra [name] is a 73 y.o. female who presents to the emergency department on 01/30/2019 because of concern for increased confusion and medication adjustment. The patient states she has noticed she has been having a harder time concentrating and has been more confused for the past few weeks. In addition she has noticed that she is having difficulty expressing her opinions. She says that she has been out of her medication for roughly 1 month. She denies any fevers, nausea vomiting, abdominal pain, change in urination or defecation.  Patient was provided with medication changes and attempt to discharge to has been.  However after waiting in the waiting room for her husband to arrive, patient became very paranoid and when husband arrived to take her home she refused to go back with him.  Initial psychiatric intake: [name] is a 73 y.o. female patient presented to [facility] ED by her husband.Mr. Kodi Guerrera stating that his wife voiced that she needs help.  Mr. Ramella stated that his wife is out off her medications and needs it to be refill until he sees her provider on March 01, 2019.  The patient was seen face-to-face by this provider; chart reviewed and consulted with Dr. Beather Arbour on 01/31/2019 due to the care of the patient. It was discussed with the provider that the patient does not meet criteria to be admitted to the inpatient unit. The discussion with Dr. Beather Arbour was explained to her ; that the patient husband Saiya Crist) brought [name] to the ED due to her been out of her medication for a few weeks. He stated that he  is unable to refill her prescriptions until he sees her provider on March 01, 2019.  Dr. Beather Arbour expressed that she would feel comfortable if Ms. Michaelson remains overnight under observation status until seen by Dr. Leverne Humbles in the morning.  Dr. Beather Arbour, discussed she is not comfortable having the patient be discharged tonight with the possibilities of her presenting with some behavior issues at home during the night. On evaluation the patient is alert and oriented x 1-2, calm and cooperative, and mood-congruent with affect.  The patient does not appear to be responding to internal or external stimuli. Neither is the patient presenting with any delusional thinking. The patient is unable to answer questions about weather or not she is suicidal, homicidal, or having self-harm thoughts. The patient is not presenting with any psychotic or paranoid behaviors. During an encounter with the patient, she was not able to answer questions appropriately.  Collateral was obtained by husband Cashlyn Huguley [phone])  who expresses concerns for patient being out of her medication and actively voicing that she needs help.  During this provider interaction with Mr. Brackney he stated that his wife was over at [facility] ED in September and she saw Dr. Weber Cooks.  He voiced that Dr. Weber Cooks had prescribed her Wellbutrin 300 mg daily, Ativan 0.5 mg as needed and risperidone 1 mg at nighttime.  Mr. Moronta stated that the medication has been working very well for [name] until she ran out and has not been able to fill her prescription.  He states, she does not have  any refills left on her prescription.  She was brought in today with the intention of him having her prescription refill until he is able to take her to her March 01, 2019 psychiatric appointment.  Plan: The patient is not a safety risk to herself or others, and does not require inpatient admission.  The patient currently requires medication refills such as Wellbutrin 300  mg daily Lorazepam 0.5 mg as needed, and Risperdal 1 mg at bedtime in order to assist the patient with stabilization and treatment.   Past Psychiatric History:  Anxiety Depression History off alcohol dependence ([place]) History of psychosis due to sleep disturbance Patient follows with Dr. Brigitte Pulse, neurology for early Alzheimer dementia.  On reevaluation 01/31/2019, patient is found dressed and wandering in her room.  She has a blank stare and is thought blocking.  When pressed to answer why she is in the hospital, she states "I have come to find the leading physicians in systems, as the [place] is the melting pot of all the smartest physicians."  Patient states that she does have some depression and since she has been in the hospital has had some suicide thoughts.  She states that she has worry about a lot of things, and is concerned that her husband is frustrated with her and will not be able to take her home or allow her to come home, and she would not have anybody to care for her.  Patient denies any panic attack symptoms.  She states that she stopped her medications approximately a month ago, because "I was confused.  Some say to take as needed other state to take daily but I did not know what that meant."  Patient states that she is hoping to find a way to bring balance back into her life.  She currently has vague suicidal thoughts that are intermittent.  She is denying any homicidal ideation.  She denies any hallucinations, however she reports that she does not watch television and sometimes feels like she "gets signs from it when watching."  Pharmacy completed a medication reconciliation and noted that prescriptions are available at the pharmacy.  Spoke with husband regarding patient, who reports that he has had concerns about patient's "disorganized and chaotic thoughts."  He notes that she is slow to respond to him and make statements which are nonsensical.  He thought patient had stopped medications due  to refills not being available, but then found that patient had been hiding medications.  When he asked why she had stopped taking them, he states she told him that "I was afraid I would run out."  Has been denies that patient has any history of self injury or suicide attempts.  He describes patient has a history of alcoholism but has abstained from alcohol for the past 15 years.  He states that she had completed a 30-day inpatient therapy prior to her sobriety.  She did have a psychiatric hospitalization in 2012 due to anxiety related to her abstaining from alcohol.  18 months ago he moved to [place] (keep residence in both states) and was noted to have an abdominal mass as well as breast cancer for which she has been treated.  Patient did have a MRI of her brain in October 2019 which was indicative of age-related changes, and did not indicate any cause of acute changes of behavior.  Risk to Self:  No Risk to Others:  No Prior Inpatient Therapy:  Yes Prior Outpatient Therapy:  Yes  On reevaluation today  in the morning, patient continues to be confused.  She is thought blocking and stares.  She states that she believes the room that she is currently in as a patient is in all-purpose room and will be the place for cremation.  She is reassured this is not the case.  She continues to express concerns that she cannot trust her husband.  Patient is provided with morning medication and an additional 1 mg of Risperdal.  On reevaluation, patient states that she is much clearer.  She has less thought blocking but still has some internal preoccupation and disorganized thoughts.  She is requesting to be discharged to her husband, and agrees to return to the hospital should her condition worsen.  Patient is aware that she has follow-up appointments with psychiatry.  Collateral obtained from husband again, who is pleased with her progress and is agreeable to picking up patient again.  Answered all questions  regarding medications and he is notified of changes that have been made today.  He appreciates that I will send a message to patient's follow-up psychiatrist, oncologist as well as primary care provider.  I have assured him that I will request follow-up with neurology as well in order to assess patient's dementia, abnormal sleep/sleep behaviors.  Past Medical History:  Past Medical History:  Diagnosis Date  . Anxiety   . Breast cancer ([place]) 03/2018   right breast  . Complication of anesthesia   . Depression   . Family history of breast cancer 03/24/2018  . Family history of lung cancer 03/24/2018  . History of alcohol dependence ([place]) 2007   no ETOH; resolved since 2007  . History of psychosis 2014   due to sleep disturbance  . Hypertension   . Personal history of radiation therapy   . PONV (postoperative nausea and vomiting)     Past Surgical History:  Procedure Laterality Date  . BREAST LUMPECTOMY WITH RADIOACTIVE SEED AND SENTINEL LYMPH NODE BIOPSY Right 04/27/2018   Procedure: RIGHT BREAST LUMPECTOMY WITH RADIOACTIVE SEED X 2 AND RIGHT SENTINEL LYMPH NODE BIOPSY;  Surgeon: [name], MD;  Location: [facility];  Service: General;  Laterality: Right;  . LAPAROTOMY N/A 02/10/2018   Procedure: EXPLORATORY LAPAROTOMY;  Surgeon: Isabel Caprice, MD;  Location: [facility];  Service: Gynecology;  Laterality: N/A;  . MASS EXCISION  01/2018   abdominal  . OVARIAN CYST REMOVAL    . SALPINGOOPHORECTOMY Bilateral 02/10/2018   Procedure: BILATERAL SALPINGO OOPHORECTOMY; PERITONEAL WASHINGS;  Surgeon: Isabel Caprice, MD;  Location: [facility];  Service: Gynecology;  Laterality: Bilateral;  . TONSILLECTOMY    . TONSILLECTOMY AND ADENOIDECTOMY  1953   Family History:  Family History  Problem Relation Age of Onset  . Diabetes Mother   . Breast cancer Mother 17  . Hypertension Mother   . Lung cancer Father        asbestos exposure  . Heart disease Brother 77  . Breast cancer  Cousin 68       paternal cousin  . Heart disease Paternal Grandmother 4  . Heart disease Paternal Grandfather    Family Psychiatric  History:History reviewed. No pertinent family history Social History:  Social History   Substance and Sexual Activity  Alcohol Use Not Currently  . Frequency: Never   Comment: alcohol dependence prior to 2007     Social History   Substance and Sexual Activity  Drug Use Never    Social History   Socioeconomic History  . Marital status:  Married    Spouse name: Not on file  . Number of children: 0  . Years of education: Not on file  . Highest education level: Not on file  Occupational History  . Occupation: retired Furniture conservator/restorer of education  Social Needs  . Financial resource strain: Not on file  . Food insecurity:    Worry: Not on file    Inability: Not on file  . Transportation needs:    Medical: Not on file    Non-medical: Not on file  Tobacco Use  . Smoking status: Never Smoker  . Smokeless tobacco: Never Used  Substance and Sexual Activity  . Alcohol use: Not Currently    Frequency: Never    Comment: alcohol dependence prior to 2007  . Drug use: Never  . Sexual activity: Yes    Partners: Male    Birth control/protection: Surgical  Lifestyle  . Physical activity:    Days per week: Not on file    Minutes per session: Not on file  . Stress: Not on file  Relationships  . Social connections:    Talks on phone: Not on file    Gets together: Not on file    Attends religious service: Not on file    Active member of club or organization: Not on file    Attends meetings of clubs or organizations: Not on file    Relationship status: Not on file  Other Topics Concern  . Not on file  Social History Narrative  . Not on file   Additional Social History:  Lives alone with husband.  Husband states that his wife does have some difficulty sleeping and awakes frequently through the night with apparent night terrors.  He  reports that they do still shared that he has not been harmed, and that touch appears to be soothing to her.  Allergies:   Allergies  Allergen Reactions  . Hydroxyzine Anaphylaxis    Tongue swollen  . Shrimp [Shellfish Allergy] Other (See Comments)    Food poisoning     Labs:  Results for orders placed or performed during the hospital encounter of 01/30/19 (from the past 48 hour(s))  Comprehensive metabolic panel     Status: Abnormal   Collection Time: 01/30/19  8:08 PM  Result Value Ref Range   Sodium 127 (L) 135 - 145 mmol/L   Potassium 3.3 (L) 3.5 - 5.1 mmol/L   Chloride 88 (L) 98 - 111 mmol/L   CO2 24 22 - 32 mmol/L   Glucose, Bld 102 (H) 70 - 99 mg/dL   BUN 12 8 - 23 mg/dL   Creatinine, Ser 0.71 0.44 - 1.00 mg/dL   Calcium 9.9 8.9 - 10.3 mg/dL   Total Protein 7.3 6.5 - 8.1 g/dL   Albumin 5.0 3.5 - 5.0 g/dL   AST 31 15 - 41 U/L   ALT 23 0 - 44 U/L   Alkaline Phosphatase 48 38 - 126 U/L   Total Bilirubin 1.0 0.3 - 1.2 mg/dL   GFR calc non Af Amer >60 >60 mL/min   GFR calc Af Amer >60 >60 mL/min   Anion gap 15 5 - 15    Comment: Performed at [facility], [address] [place], [place] [address]  Ethanol     Status: None   Collection Time: 01/30/19  8:08 PM  Result Value Ref Range   Alcohol, Ethyl (B) <10 <10 mg/dL    Comment: (NOTE) Lowest detectable limit for serum alcohol is  10 mg/dL. For medical purposes only. Performed at [facility], [address] [place], [place] [address]   Salicylate level     Status: None   Collection Time: 01/30/19  8:08 PM  Result Value Ref Range   Salicylate Lvl <5.4 2.8 - 30.0 mg/dL    Comment: Performed at [facility], [address] [place], [place] [address]  Acetaminophen level     Status: Abnormal   Collection Time: 01/30/19  8:08 PM  Result Value Ref Range   Acetaminophen (Tylenol), Serum <10 (L) 10 - 30 ug/mL    Comment: (NOTE) Therapeutic concentrations vary significantly. A range of  10-30 ug/mL  may be an effective concentration for many patients. However, some  are best treated at concentrations outside of this range. Acetaminophen concentrations >150 ug/mL at 4 hours after ingestion  and >50 ug/mL at 12 hours after ingestion are often associated with  toxic reactions. Performed at [facility], [address] [place], [place] [address]   cbc     Status: None   Collection Time: 01/30/19  8:08 PM  Result Value Ref Range   WBC 5.9 4.0 - 10.5 K/uL   RBC 5.06 3.87 - 5.11 MIL/uL   Hemoglobin 14.5 12.0 - 15.0 g/dL   HCT 42.1 36.0 - 46.0 %   MCV 83.2 80.0 - 100.0 fL   MCH 28.7 26.0 - 34.0 pg   MCHC 34.4 30.0 - 36.0 g/dL   RDW 13.2 11.5 - 15.5 %   Platelets 248 150 - 400 K/uL   nRBC 0.0 0.0 - 0.2 %    Comment: Performed at [facility], [address] [place], [place] [address]  Urine Drug Screen, Qualitative     Status: None   Collection Time: 01/31/19 12:43 AM  Result Value Ref Range   Tricyclic, Ur Screen NONE DETECTED NONE DETECTED   Amphetamines, Ur Screen NONE DETECTED NONE DETECTED   MDMA (Ecstasy)Ur Screen NONE DETECTED NONE DETECTED   Cocaine Metabolite,Ur [place] NONE DETECTED NONE DETECTED   Opiate, Ur Screen NONE DETECTED NONE DETECTED   Phencyclidine (PCP) Ur S NONE DETECTED NONE DETECTED   Cannabinoid 50 Ng, Ur [place] NONE DETECTED NONE DETECTED   Barbiturates, Ur Screen NONE DETECTED NONE DETECTED   Benzodiazepine, Ur Scrn NONE DETECTED NONE DETECTED   Methadone Scn, Ur NONE DETECTED NONE DETECTED    Comment: (NOTE) Tricyclics + metabolites, urine    Cutoff 1000 ng/mL Amphetamines + metabolites, urine  Cutoff 1000 ng/mL MDMA (Ecstasy), urine              Cutoff 500 ng/mL Cocaine Metabolite, urine          Cutoff 300 ng/mL Opiate + metabolites, urine        Cutoff 300 ng/mL Phencyclidine (PCP), urine         Cutoff 25 ng/mL Cannabinoid, urine                 Cutoff 50 ng/mL Barbiturates + metabolites, urine  Cutoff 200  ng/mL Benzodiazepine, urine              Cutoff 200 ng/mL Methadone, urine                   Cutoff 300 ng/mL The urine drug screen provides only a preliminary, unconfirmed analytical test result and should not be used for non-medical purposes. Clinical consideration and professional judgment should be applied to any positive drug screen result due to possible interfering  substances. A more specific alternate chemical method must be used in order to obtain a confirmed analytical result. Gas chromatography / mass spectrometry (GC/MS) is the preferred confirmat ory method. Performed at [facility], [address], [place], [place] [address]     Current Facility-Administered Medications  Medication Dose Route Frequency Provider Last Rate Last Dose  . FLUoxetine (PROZAC) capsule 40 mg  40 mg Oral Daily Lavella Hammock, MD   40 mg at 02/01/19 1241  . lisinopril (PRINIVIL,ZESTRIL) tablet 20 mg  20 mg Oral Daily Lavella Hammock, MD   20 mg at 02/01/19 1241   And  . hydrochlorothiazide (MICROZIDE) capsule 12.5 mg  12.5 mg Oral Daily Lavella Hammock, MD   12.5 mg at 02/01/19 1241  . letrozole [facility]) tablet 2.5 mg  2.5 mg Oral Daily Arta Silence, MD   2.5 mg at 02/01/19 1100  . risperiDONE (RISPERDAL) tablet 2 mg  2 mg Oral QHS Lavella Hammock, MD       Current Outpatient Medications  Medication Sig Dispense Refill  . cyanocobalamin (,VITAMIN B-12,) 1000 MCG/ML injection Inject 1 mL (1,000 mcg total) into the muscle every 30 (thirty) days. (Patient not taking: Reported on 01/31/2019) 3 mL 3  . FLUoxetine (PROZAC) 40 MG capsule Take 1 capsule (40 mg total) by mouth daily. 90 capsule 0  . letrozole (FEMARA) 2.5 MG tablet Take 1 tablet (2.5 mg total) by mouth daily. 90 tablet 3  . lisinopril-hydrochlorothiazide (PRINZIDE,ZESTORETIC) 20-12.5 MG tablet Take 1 tablet by mouth daily. 90 tablet 3  . LORazepam (ATIVAN) 0.5 MG tablet Take 1 tablet (0.5 mg total) by mouth daily as  needed for anxiety. 30 tablet 2  . risperiDONE (RISPERDAL) 2 MG tablet Take 1 tablet (2 mg total) by mouth at bedtime. 90 tablet 0    Musculoskeletal: Strength & Muscle Tone: decreased Gait & Station: normal Patient leans: N/A  Psychiatric Specialty Exam:  Physical Exam  Nursing note and vitals reviewed. Constitutional: She appears well-developed and well-nourished. No distress.  HENT:  Head: Normocephalic and atraumatic.  Eyes: Conjunctivae and EOM are normal.  Neck: Normal range of motion.  Cardiovascular: Normal rate and regular rhythm.  Respiratory: Effort normal. No respiratory distress.  Musculoskeletal: Normal range of motion.  Neurological: She is alert.  Disoriented and thought blocking  Review of Systems  Constitutional: Negative.   HENT: Negative.   Eyes: Negative.   Respiratory: Negative.   Cardiovascular: Negative.   Gastrointestinal: Negative.   Genitourinary: Negative.   Musculoskeletal: Negative.   Skin: Negative.   Neurological: Negative.   Endo/Heme/Allergies: Negative.   Psychiatric/Behavioral: Positive for depression and memory loss. Negative for hallucinations, substance abuse and suicidal ideas. The patient is nervous/anxious and has insomnia.     Blood pressure 134/75, pulse 88, temperature 98.4 F (36.9 C), temperature source Oral, resp. rate 16, height 5\' 7"  (1.702 m), weight 77.1 kg, SpO2 99 %.Body mass index is 26.63 kg/m.  General Appearance: Fairly Groomed  Eye Contact:  Minimal  Speech:  Blocked and Slow  Volume:  Decreased  Mood:  Depressed and Hopeless  Affect:  Blunt and Depressed  Thought Process:  Disorganized  Orientation:  Other:  She knows her name and year, but unable to provide much more information as to why she is here.  Thought Content:  Unable to verbalized much to provider  Suicidal Thoughts:  No  Homicidal Thoughts:  No  Memory:  Recent;   Poor  Judgement:  Impaired  Insight:  Lacking  Psychomotor Activity:  Decreased   Concentration:  Concentration: Good  Recall:  Poor  Fund of Knowledge:  Poor  Language:  Fair  Akathisia:  NA  Handed:  Right  AIMS (if indicated):     Assets:  Desire for Improvement Others:  Medication refills  ADL's:  Intact  Cognition:  Impaired,  Mild sure not  Sleep:   Insomnia     Treatment Plan Summary: Anessa Charley is a 73 y.o. female  has an appointment with Dr. Shea Evans April 29 to establish care, and oncology on April 30.  I saw her in the emergency department with a psychotic depression complicated by the development of dementia.  Patient and husband were confused that medications were available to them at the pharmacy and she has been off medication for approximately a month.  They have moved from [place] recently and will be making primary residence [place] as patient is in treatment for breast cancer with [place] clinic.  I have discharged her on Prozac 40 mg and Risperdal 3 mg at night.  She does have a standing prescription for Ativan 0.5 mg as needed.  She has a history of alcoholism and has been sober for 15 years.  Her husband has instructed to not allow her to take this more than once daily and truly to reserve for panic attacks only.  At time of discharge patient continued to have some paranoia towards husband but was significantly improved.  Husband reports that patient has poor sleep at night and will awaken with screaming as of acting out a dream.  I suspect patient may have either an underlying sleep apnea versus development of Parkinson's like symptoms.  Patient previously had a neurologist to follow her dementia symptoms, however has not established with a neurologist in [place].  Family would appreciate a referral to neurology.  Thank you  Medication management  After review of patient's symptoms with husband, suspect that patient has developed major depressive disorder with psychotic features.  We will make changes to medications as below, and  inform Dr. Shea Evans of these changes in order for her to be aware prior to their establish psychiatric intake appointment on March 01, 2019.  Husband believes that he is able to maintain patient safety.  There are no guns in the home.  Medications: Prozac 40 mg daily in the morning for depression Risperdal 3 mg at night for psychosis and to improve sleep quality. Continue Femara, Zestoretic, and vitamin B12 as per listed home medications.   Disposition: No evidence of imminent risk to self or others at present.   Patient does not meet criteria for psychiatric inpatient admission. Discussed crisis plan, support from social network, calling 911, coming to the Emergency Department, and calling Suicide Hotline.   She was able to engage in safety planning including plan to return to nearest emergency room or contact emergency services if she feels unable to maintain her own safety or the safety of others. Patient had no further questions, comments, or concerns.  Discharge into care of husband, who agrees to maintain patient safety.   Lavella Hammock, MD 02/01/2019 3:06 PM

## 2019-02-01 NOTE — ED Provider Notes (Signed)
-----------------------------------------   3:04 PM on 02/01/2019 -----------------------------------------   Blood pressure 134/75, pulse 88, temperature 98.4 F (36.9 C), temperature source Oral, resp. rate 16, height 5\' 7"  (1.702 m), weight 77.1 kg, SpO2 99 %.  Patient has been evaluated by psychiatry and cleared for discharge. IVC lifted by Dr. Valeda Malm. Patient's labs have been reviewed with no acute findings. Patient will be discharged at this time to home    Alfred Levins, [place], MD 02/01/19 1504

## 2019-02-01 NOTE — ED Notes (Signed)
Pt with a call from her husband  [name]  [phone]

## 2019-02-02 ENCOUNTER — Other Ambulatory Visit: Payer: Self-pay | Admitting: Family Medicine

## 2019-02-02 DIAGNOSIS — R4189 Other symptoms and signs involving cognitive functions and awareness: Secondary | ICD-10-CM

## 2019-02-09 ENCOUNTER — Telehealth: Payer: Self-pay | Admitting: Family Medicine

## 2019-02-09 NOTE — Telephone Encounter (Signed)
Pt's husband called wanting to know why Dr. Jacinto Reap did a referral to Dr. Manuella Ghazi for cognitive impairment.   He also wants to know if they need to make an appt to see you for the ER follow-up.  CB# [phone]   teri

## 2019-02-13 NOTE — Telephone Encounter (Signed)
Patient's husband advised 

## 2019-02-13 NOTE — Telephone Encounter (Signed)
LMTCB

## 2019-02-13 NOTE — Telephone Encounter (Signed)
This was recommended by the ER physician and psychiatrist that she saw 01/31/2019 I nthe ER.  There was some concern about some cognitive impairment that may be beyond the depression.  It was recommended that she see a neurologist for this.  Per the other physician, patient agreed to this.

## 2019-02-23 ENCOUNTER — Other Ambulatory Visit: Payer: Self-pay | Admitting: Family Medicine

## 2019-02-23 NOTE — Telephone Encounter (Signed)
She last felt this on 02/01/2019.  It is not appropriate to take more than directed.  We do not do early refills on controlled substances.

## 2019-02-23 NOTE — Telephone Encounter (Signed)
Pt needs an early refill on her Ativan 0.5 mg  She has been taking them twice a day.  In the am and pm  [place]  CB#  [phone]  Thanks Con Memos

## 2019-02-23 NOTE — Telephone Encounter (Signed)
Please advise 

## 2019-02-27 NOTE — Telephone Encounter (Signed)
Pt advised.   Thanks,   -Jonquil Stubbe  

## 2019-03-01 ENCOUNTER — Other Ambulatory Visit: Payer: Self-pay

## 2019-03-01 ENCOUNTER — Ambulatory Visit (INDEPENDENT_AMBULATORY_CARE_PROVIDER_SITE_OTHER): Payer: Medicare Other | Admitting: Psychiatry

## 2019-03-01 ENCOUNTER — Inpatient Hospital Stay: Payer: Medicare Other | Attending: Hematology and Oncology

## 2019-03-01 ENCOUNTER — Encounter: Payer: Self-pay | Admitting: Psychiatry

## 2019-03-01 DIAGNOSIS — F411 Generalized anxiety disorder: Secondary | ICD-10-CM | POA: Diagnosis not present

## 2019-03-01 DIAGNOSIS — F09 Unspecified mental disorder due to known physiological condition: Secondary | ICD-10-CM

## 2019-03-01 DIAGNOSIS — C50411 Malignant neoplasm of upper-outer quadrant of right female breast: Secondary | ICD-10-CM | POA: Diagnosis present

## 2019-03-01 DIAGNOSIS — Z170 Estrogen receptor positive status [ER+]: Principal | ICD-10-CM

## 2019-03-01 DIAGNOSIS — F5105 Insomnia due to other mental disorder: Secondary | ICD-10-CM | POA: Diagnosis not present

## 2019-03-01 DIAGNOSIS — F3130 Bipolar disorder, current episode depressed, mild or moderate severity, unspecified: Secondary | ICD-10-CM | POA: Diagnosis not present

## 2019-03-01 LAB — CBC WITH DIFFERENTIAL/PLATELET
Abs Immature Granulocytes: 0.02 10*3/uL (ref 0.00–0.07)
Basophils Absolute: 0 10*3/uL (ref 0.0–0.1)
Basophils Relative: 1 %
Eosinophils Absolute: 0.1 10*3/uL (ref 0.0–0.5)
Eosinophils Relative: 2 %
HCT: 39.6 % (ref 36.0–46.0)
Hemoglobin: 13 g/dL (ref 12.0–15.0)
Immature Granulocytes: 0 %
Lymphocytes Relative: 19 %
Lymphs Abs: 0.9 10*3/uL (ref 0.7–4.0)
MCH: 28.5 pg (ref 26.0–34.0)
MCHC: 32.8 g/dL (ref 30.0–36.0)
MCV: 86.8 fL (ref 80.0–100.0)
Monocytes Absolute: 0.5 10*3/uL (ref 0.1–1.0)
Monocytes Relative: 11 %
Neutro Abs: 3.2 10*3/uL (ref 1.7–7.7)
Neutrophils Relative %: 67 %
Platelets: 187 10*3/uL (ref 150–400)
RBC: 4.56 MIL/uL (ref 3.87–5.11)
RDW: 14 % (ref 11.5–15.5)
WBC: 4.7 10*3/uL (ref 4.0–10.5)
nRBC: 0 % (ref 0.0–0.2)

## 2019-03-01 LAB — COMPREHENSIVE METABOLIC PANEL
ALT: 56 U/L — ABNORMAL HIGH (ref 0–44)
AST: 30 U/L (ref 15–41)
Albumin: 4 g/dL (ref 3.5–5.0)
Alkaline Phosphatase: 57 U/L (ref 38–126)
Anion gap: 9 (ref 5–15)
BUN: 11 mg/dL (ref 8–23)
CO2: 24 mmol/L (ref 22–32)
Calcium: 9.2 mg/dL (ref 8.9–10.3)
Chloride: 100 mmol/L (ref 98–111)
Creatinine, Ser: 0.69 mg/dL (ref 0.44–1.00)
GFR calc Af Amer: >60 mL/min (ref >60)
GFR calc non Af Amer: >60 mL/min (ref >60)
Glucose, Bld: 111 mg/dL — ABNORMAL HIGH (ref 70–99)
Potassium: 3.6 mmol/L (ref 3.5–5.1)
Sodium: 133 mmol/L — ABNORMAL LOW (ref 135–145)
Total Bilirubin: 0.7 mg/dL (ref 0.3–1.2)
Total Protein: 6.8 g/dL (ref 6.5–8.1)

## 2019-03-01 MED ORDER — QUETIAPINE FUMARATE 25 MG PO TABS: 25.0000 mg | ORAL_TABLET | Freq: Every day | ORAL | 0 refills | Status: DC

## 2019-03-01 NOTE — Progress Notes (Signed)
[facility]  [address], [address] [place], [place] [address] Phone: [phone]  Fax: [phone]   Telemedicine Office Visit:  03/02/2019  Referring physician: Virginia Crews, MD  I connected with Kendra [name] on 03/02/2019 at 9:17 AM by videoconferencing and verified that I was speaking with the correct person using 2 identifiers.  The patient was at home.  I discussed the limitations, risk, security and privacy concerns of performing an evaluation and management service by videoconferencing and the availability of in person appointments.  I also discussed with the patient that there may be a patient responsible charge related to this service.  The patient expressed understanding and agreed to proceed.   Chief Complaint: Kendra [name] is a 73 y.o. female  with multifocal stage IA right breast cancer, B12 deficiency who is seen for 3 month assessment.  HPI: The patient was last seen in the medical oncology clinic on 12/01/2018. At that time, the patient was doing well. She was tolerating Femara with managable vasomotor symptoms.  She had a good appetite. Weight was stable. She denied pain.   She was admitted to [facility] 01/30/2019 - 02/01/2019 for confusion and difficulty concentrating after being off her anti-depression medication for one month. Psychiatry evaluation concluded major depressive disorder with features of psychosis. Her medication was adjusted and she was discharged.   During the interim, the patient reports she is "about the same." She reports occasional heaviness in her right breast. She denies lumps or nipple discharge. She continues on letrozole. She has occasional, mild hot flashes and night sweats.  She denies SOB. Appetite is good. She has been taking Equate and denies constipation. Her weight is 164.8 today, down 5lbs since her last clinic visit. She is able to walk around her neighborhood daily. She continues to receive monthly B12  injections at home. She is not currently taking calcium or Vitamin D supplements.    Past Medical History:  Diagnosis Date  . Anxiety   . Breast cancer ([place]) 03/2018   right breast  . Complication of anesthesia   . Depression   . Family history of breast cancer 03/24/2018  . Family history of lung cancer 03/24/2018  . History of alcohol dependence ([place]) 2007   no ETOH; resolved since 2007  . History of psychosis 2014   due to sleep disturbance  . Hypertension   . Personal history of radiation therapy   . PONV (postoperative nausea and vomiting)     Past Surgical History:  Procedure Laterality Date  . BREAST LUMPECTOMY WITH RADIOACTIVE SEED AND SENTINEL LYMPH NODE BIOPSY Right 04/27/2018   Procedure: RIGHT BREAST LUMPECTOMY WITH RADIOACTIVE SEED X 2 AND RIGHT SENTINEL LYMPH NODE BIOPSY;  Surgeon: [name], MD;  Location: [facility];  Service: General;  Laterality: Right;  . LAPAROTOMY N/A 02/10/2018   Procedure: EXPLORATORY LAPAROTOMY;  Surgeon: Isabel Caprice, MD;  Location: [facility];  Service: Gynecology;  Laterality: N/A;  . MASS EXCISION  01/2018   abdominal  . OVARIAN CYST REMOVAL    . SALPINGOOPHORECTOMY Bilateral 02/10/2018   Procedure: BILATERAL SALPINGO OOPHORECTOMY; PERITONEAL WASHINGS;  Surgeon: Isabel Caprice, MD;  Location: [facility];  Service: Gynecology;  Laterality: Bilateral;  . TONSILLECTOMY    . TONSILLECTOMY AND ADENOIDECTOMY  1953    Family History  Problem Relation Age of Onset  . Diabetes Mother   . Breast cancer Mother 54  . Hypertension Mother   . Lung cancer Father  asbestos exposure  . Heart disease Brother 23  . Breast cancer Cousin 77       paternal cousin  . Heart disease Paternal Grandmother 73  . Heart disease Paternal Grandfather     Social History:  reports that she has never smoked. She has never used smokeless tobacco. She reports previous alcohol use. She reports that she does not use drugs.The patient is  accompanied by her husband [name] today.  Participants in the patient's visit and their role in the encounter included the patient, her husband [name], and Vito Berger, CMA, today.  The intake visit was provided by Vito Berger, CMA.  Allergies:  Allergies  Allergen Reactions  . Hydroxyzine Anaphylaxis    Tongue swollen  . Shrimp [Shellfish Allergy] Other (See Comments)    Food poisoning     Current Medications: Current Outpatient Medications  Medication Sig Dispense Refill  . cyanocobalamin (,VITAMIN B-12,) 1000 MCG/ML injection Inject 1 mL (1,000 mcg total) into the muscle every 30 (thirty) days. 3 mL 3  . FLUoxetine (PROZAC) 40 MG capsule Take 1 capsule (40 mg total) by mouth daily. 90 capsule 0  . letrozole (FEMARA) 2.5 MG tablet Take 1 tablet (2.5 mg total) by mouth daily. 90 tablet 3  . lisinopril-hydrochlorothiazide (PRINZIDE,ZESTORETIC) 20-12.5 MG tablet Take 1 tablet by mouth daily. 90 tablet 3  . LORazepam (ATIVAN) 0.5 MG tablet Take 1 tablet (0.5 mg total) by mouth daily as needed for anxiety. (Patient not taking: Reported on 03/02/2019) 30 tablet 2  . QUEtiapine (SEROQUEL) 25 MG tablet Take 1 tablet (25 mg total) by mouth at bedtime. For sleep,mood (Patient not taking: Reported on 03/02/2019) 30 tablet 0   No current facility-administered medications for this visit.     Review of Systems  Constitutional: Positive for weight loss (164.8 today, down 5lbs since last clinic visit.). Negative for chills, fever and malaise/fatigue.       Feels "about the same." Occasional, mild hot flashes and night sweats.  HENT: Negative.  Negative for congestion, hearing loss, nosebleeds, sinus pain and sore throat.   Eyes: Negative.  Negative for blurred vision, double vision, photophobia and redness.  Respiratory: Negative.  Negative for cough, sputum production, shortness of breath (exertional) and wheezing.   Cardiovascular: Negative.  Negative for chest pain, palpitations, leg  swelling and PND.  Gastrointestinal: Negative.  Negative for abdominal pain, blood in stool, constipation (using Equate), diarrhea, nausea and vomiting.  Genitourinary: Negative.  Negative for dysuria, frequency, hematuria and urgency.  Musculoskeletal: Negative for back pain, myalgias and neck pain.       Occasional heaviness in right breast. Denies lumps or nipple discharge.  Skin: Negative.  Negative for itching and rash.  Neurological: Negative for dizziness, sensory change, weakness and headaches.       Balance is "not good".  Endo/Heme/Allergies: Does not bruise/bleed easily.       Vasomotor symptoms.   Psychiatric/Behavioral: Positive for memory loss. The patient is not nervous/anxious.    Performance status (ECOG): 1  Physical Exam  Constitutional: She is oriented to person, place, and time. She appears well-developed and well-nourished. No distress.  HENT:  Head: Normocephalic and atraumatic.  Shoulder length blonde hair.  Eyes: Conjunctivae and EOM are normal. No scleral icterus.  Blue eyes.  Neurological: She is alert and oriented to person, place, and time.  Skin: She is not diaphoretic. No pallor.  Psychiatric: She has a normal mood and affect. Her behavior is normal. Judgment and thought content normal.  Nursing note reviewed.    Appointment on 03/01/2019  Component Date Value Ref Range Status  . CA 19-9 03/01/2019 45* 0 - 35 U/mL Final   Comment: (NOTE) Roche Diagnostics Electrochemiluminescence Immunoassay (ECLIA) Values obtained with different assay methods or kits cannot be used interchangeably.  Results cannot be interpreted as absolute evidence of the presence or absence of malignant disease. Performed At: [facility] [place], [place] [id number] Rush Farmer MD [id number]   . CA 27.29 03/01/2019 25.4  0.0 - 38.6 U/mL Final   Comment: (NOTE) Siemens Centaur Immunochemiluminometric Methodology [facility]) Values obtained with  different assay methods or kits cannot be used interchangeably. Results cannot be interpreted as absolute evidence of the presence or absence of malignant disease. Performed At: [facility] [place], [place] [id number] Rush Farmer MD [id number]   . Sodium 03/01/2019 133* 135 - 145 mmol/L Final  . Potassium 03/01/2019 3.6  3.5 - 5.1 mmol/L Final  . Chloride 03/01/2019 100  98 - 111 mmol/L Final  . CO2 03/01/2019 24  22 - 32 mmol/L Final  . Glucose, Bld 03/01/2019 111* 70 - 99 mg/dL Final  . BUN 03/01/2019 11  8 - 23 mg/dL Final  . Creatinine, Ser 03/01/2019 0.69  0.44 - 1.00 mg/dL Final  . Calcium 03/01/2019 9.2  8.9 - 10.3 mg/dL Final  . Total Protein 03/01/2019 6.8  6.5 - 8.1 g/dL Final  . Albumin 03/01/2019 4.0  3.5 - 5.0 g/dL Final  . AST 03/01/2019 30  15 - 41 U/L Final  . ALT 03/01/2019 56* 0 - 44 U/L Final  . Alkaline Phosphatase 03/01/2019 57  38 - 126 U/L Final  . Total Bilirubin 03/01/2019 0.7  0.3 - 1.2 mg/dL Final  . GFR calc non Af Amer 03/01/2019 >60  >60 mL/min Final  . GFR calc Af Amer 03/01/2019 >60  >60 mL/min Final  . Anion gap 03/01/2019 9  5 - 15 Final   Performed at [facility], [address] [place], [place] [address]  . WBC 03/01/2019 4.7  4.0 - 10.5 K/uL Final  . RBC 03/01/2019 4.56  3.87 - 5.11 MIL/uL Final  . Hemoglobin 03/01/2019 13.0  12.0 - 15.0 g/dL Final  . HCT 03/01/2019 39.6  36.0 - 46.0 % Final  . MCV 03/01/2019 86.8  80.0 - 100.0 fL Final  . MCH 03/01/2019 28.5  26.0 - 34.0 pg Final  . MCHC 03/01/2019 32.8  30.0 - 36.0 g/dL Final  . RDW 03/01/2019 14.0  11.5 - 15.5 % Final  . Platelets 03/01/2019 187  150 - 400 K/uL Final  . nRBC 03/01/2019 0.0  0.0 - 0.2 % Final  . Neutrophils Relative % 03/01/2019 67  % Final  . Neutro Abs 03/01/2019 3.2  1.7 - 7.7 K/uL Final  . Lymphocytes Relative 03/01/2019 19  % Final  . Lymphs Abs 03/01/2019 0.9  0.7 - 4.0 K/uL Final  . Monocytes Relative 03/01/2019 11  %  Final  . Monocytes Absolute 03/01/2019 0.5  0.1 - 1.0 K/uL Final  . Eosinophils Relative 03/01/2019 2  % Final  . Eosinophils Absolute 03/01/2019 0.1  0.0 - 0.5 K/uL Final  . Basophils Relative 03/01/2019 1  % Final  . Basophils Absolute 03/01/2019 0.0  0.0 - 0.1 K/uL Final  . Immature Granulocytes 03/01/2019 0  % Final  . Abs Immature Granulocytes 03/01/2019 0.02  0.00 - 0.07 K/uL Final   Performed at [facility],  [address], [place], [place] [address]    Assessment:  Seham Gardenhire is a 73 y.o. female with multi-focal stage IA right breast cancer s/p right lumpectomy on 04/27/2018.  Pathology of the right lateral lesion revealed a 2.5 cm grade II invasive ductal carcinoma with intermediate grade DCIS.  The medial lesion revealed a 1.1 cm grade II invasive ductal carcinoma with intermediate grade DCIS. LVIDS was present.  Margins were negative.  Zero of 2 lymph nodes were positive. Both tumors were ER + (100%), PR + (70%), and HER-2 negative.  Ki-67 was 10%.  Pathologic stage was mT2N0.  Oncotype DX revealed a recurrence score of 12 which translated to a distant recurrence of 3% at 9 years with an AI or tamxifen alone.  The absolute benefit of chemotherpay was < 1%.  Invitae testing on 03/23/2018 revealed a variant of uncertain significance in MSH6.   She received breast radiation from 06/30/2018 - 07/20/2018.  She began Femara on 09/06/2018.  She is tolerating it well.  CA27.29 has been followed: 25.4 on 03/01/2019.  She underwent exploratory laparotomy, bilateral salpingo-oophrectomy, and pelvic washings on 02/10/2018. 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  Peritoneal washings revealed atypical cells.    She has had elevated tumor markers.  CA125 has been followed (0-38.1): 48.2 on 01/26/2018 and 11.3 on 10/24/2018. CA19-9 has been followed (0-35): 322 on 01/26/2018, 48 on 10/24/2018, and 45 on 03/01/2019.  Abdomen and pelvic CT on  02/04/2018 revealed a large (28.5 x 17 x 25 cm) cystic mass with enhancing septations and nodularity involving the majority of the abdomen.  Abdomen and pelvic CT on 10/14/2018 revealed no acute findings.  There was interval surgical resection of large cystic pelvic mass.  There was no evidence of residual or metastatic neoplasm.  There was suspected hepatic cirrhosis.  There were three midline ventral abdominal wall hernias (small epigastric ventral hernia, moderate paraumbilical hernia, and small suprapubic ventral hernia).   She has a history of elevated LFTs.  AST was 50 on 10/06/2018.  Hepatitis B and C serologies were negative.  Bone density on 06/28/2018 was normal with a T score of -0.8 in the right femoral neck.  She has B12 deficiency.  B12 was 232 on 08/09/2018 and 386 on 10/24/2018.  She began B12 injections on 08/15/2018.  She receives B12 monthly at home.  Anti-parietal antibody was elevated (23; 0-20) and intrinsic factor was normal on 10/24/2018.  Symptomatically, she denies any breast concerns.  She has occasional hot flashes.  Quality of life is good.  Plan: 1. Review labs from 03/01/2019. 2. Multifocal stage IA RIGHT breast cancer             Clinically she is doing well.    She denies any breast concerns.    Discuss plan for breast exam at next visit.  Continue letrozole. 3.   Elevated liver function tests       AST 30.  ALT 56.  Bilirubin 0.7.  Alkaline phosphatase 57.    No liver lesions noted on 10/14/2018 CT.         Continue to monitor. 4.   B12 deficiency             Patient remains on B12 supplementation.             Parietal cell antibody was positive on 10/24/2018 c/w pernicious anemia.  Check folate periodically. 5.   Multilocular mucinous cystadenoma  Benign pathology.  Some atypical cells noted washings.             Abdomen and pelvic CT scans were negative on 10/14/2018.             She has a history of increased markers (CA19-9 and CA125).              Continue to monitor 6.   RTC in 3 months for MD assessment and labs (CBC with diff, CMP, CA 27.29 and CA 19-9).  I discussed the assessment and treatment plan with the patient.  The patient was provided an opportunity to ask questions and all were answered.  The patient agreed with the plan and demonstrated an understanding of the instructions.  The patient was advised to call back or seek an in person evaluation if the symptoms worsen or if the condition fails to improve as anticipated.  I provided 25 minutes (9:17 AM - 9:42 AM) of face-to-face video visit time during this this encounter and > 50% was spent counseling as documented under my assessment and plan.  I provided these services from the [facility] office.   Nolon Stalls, MD, PhD  03/02/2019, 9:17 AM  I, Cloyde Reams Dorshimer, am acting as Education administrator for [employer]. Mike Gip, MD, PhD.  I, Melissa C. Mike Gip, MD, have reviewed the above documentation for accuracy and completeness, and I agree with the above.

## 2019-03-01 NOTE — Progress Notes (Signed)
Virtual Visit via Video Note  I connected with Kendra [name] on 03/01/19 at  3:00 PM EDT by a video enabled telemedicine application and verified that I am speaking with the correct person using two identifiers.   I discussed the limitations of evaluation and management by telemedicine and the availability of in person appointments. The patient expressed understanding and agreed to proceed.   I discussed the assessment and treatment plan with the patient. The patient was provided an opportunity to ask questions and all were answered. The patient agreed with the plan and demonstrated an understanding of the instructions.   The patient was advised to call back or seek an in-person evaluation if the symptoms worsen or if the condition fails to improve as anticipated.    Psychiatric Initial Adult Assessment   Patient Identification: Kendra [name] MRN:  [id number] Date of Evaluation:  03/01/2019 Referral Source: Honor Loh NP Chief Complaint:   Chief Complaint    Establish Care; Insomnia; Memory Loss; Depression; Anxiety     Visit Diagnosis:    ICD-10-CM   1. Bipolar I disorder, most recent episode depressed (HCC) F31.30 QUEtiapine (SEROQUEL) 25 MG tablet  2. GAD (generalized anxiety disorder) F41.1   3. Insomnia due to mental condition F51.05   4. Mild cognitive disorder F09    likely due to multiple etiologies including polypharmacy, history of bipolar disorder, alcohol abuse     History of Present Illness: Kendra [name] is a 73 yr old Caucasian female who is married, lives in [place], has a history of bipolar disorder, cognitive problems, history of multifocal stage Ia right breast cancer, status post treatment-lumpectomy, ovarian cyst removal, was evaluated by telemedicine today.  Patient appeared to be a limited historian and hence majority of information was obtained from husband [name].    Patient with past history of bipolar disorder.  Patient currently on medications like Prozac,  risperidone and lorazepam.  Patient was seen in the emergency department at [facility] multiple times in the past few months.  She was seen by Dr. Weber Cooks on 07/25/2018 as well as Dr. Melba Coon on 3/31 and 02/01/2019.  Review of E [facility] notes per our consult team dated 3/31-02/01/2019-patient was brought to the emergency department for concerns of increased confusion.  At that time was found to have low sodium level as well as required IV fluid.  Her medications were readjusted and patient was advised to follow-up with outpatient psychiatry.  Per husband patient continues to struggle with on and off confusion.  There are times when she is confused and is not able to articulate her thoughts.  There are times when she cannot comprehend what she is watching on TV and that is disturbing to her.  Patient continues to struggle with sleep problems at night.  Patient also has a lot of anxiety symptoms.  She rates her anxiety at 5-8, 8 being the worst.  She does not like to be left alone.  At baseline she feels her anxiety symptoms are at a 5.  But they can peak at certain situations.  Patient reports Prozac is helpful to some extent.  Patient does report that she takes Ativan on and off.  Per husband she has been taking it daily as needed up to 4-5 times a week.  They do help to some extent.  Patient has a previous history of mood lability, manic and depressive symptoms.  She has had previous admission to inpatient facilities in the past in [place].  Husband reports that he could request  records from his previous psychiatrist.  Patient does report a history of alcoholism.  She quit using alcohol in 2012.  Prior to that she was drinking heavily.  She did have 1 residential treatment for alcoholism and has been sober ever since.  Patient is currently under the care of neurologist Dr. Manuella Ghazi.  Per neurology they have requested an MRI and patient as well as husband are currently waiting since the COVID-19 outbreak to get it  done.  Reviewed medical records in E [facility] per Dr. Manuella Ghazi dated 10/14/2018-' patient with cognitive impairment likely secondary to multiple etiologies, due to neurodegenerative disorder is not suspected at this time but cannot be ruled out.  Patient with history of contributing factors including bipolar, paranoia plus history of alcoholism for 20 years plus prescribed medication side effects.  Continues to have low suspicion for paraneoplastic syndrome and ruled out cerebral metastasis with imaging.'  Does have vitamin B12 deficiency and was advised to continue vitamin B12 supplements.  Associated Signs/Symptoms: Depression Symptoms:  depressed mood, difficulty concentrating, anxiety, disturbed sleep, (Hypo) Manic Symptoms:  Distractibility, Elevated Mood, Impulsivity, Irritable Mood, Labiality of Mood, Anxiety Symptoms:  Excessive Worry, Panic Symptoms, Psychotic Symptoms:  Hx of delusions and hallucinations - denies now PTSD Symptoms: Had a traumatic exposure:  as summarized above  Past Psychiatric History: Patient with a past diagnosis of bipolar disorder.  Patient with previous inpatient mental health admission in [place] years ago.  Patient denies any suicide attempts.  Previous Psychotropic Medications: Yes Risperidone, Prozac, Ativan  Substance Abuse History in the last 12 months:  No.  Consequences of Substance Abuse: Negative  Past Medical History:  Past Medical History:  Diagnosis Date  . Anxiety   . Breast cancer ([place]) 03/2018   right breast  . Complication of anesthesia   . Depression   . Family history of breast cancer 03/24/2018  . Family history of lung cancer 03/24/2018  . History of alcohol dependence ([place]) 2007   no ETOH; resolved since 2007  . History of psychosis 2014   due to sleep disturbance  . Hypertension   . Personal history of radiation therapy   . PONV (postoperative nausea and vomiting)     Past Surgical History:  Procedure Laterality Date  .  BREAST LUMPECTOMY WITH RADIOACTIVE SEED AND SENTINEL LYMPH NODE BIOPSY Right 04/27/2018   Procedure: RIGHT BREAST LUMPECTOMY WITH RADIOACTIVE SEED X 2 AND RIGHT SENTINEL LYMPH NODE BIOPSY;  Surgeon: [name], MD;  Location: [facility];  Service: General;  Laterality: Right;  . LAPAROTOMY N/A 02/10/2018   Procedure: EXPLORATORY LAPAROTOMY;  Surgeon: Isabel Caprice, MD;  Location: [facility];  Service: Gynecology;  Laterality: N/A;  . MASS EXCISION  01/2018   abdominal  . OVARIAN CYST REMOVAL    . SALPINGOOPHORECTOMY Bilateral 02/10/2018   Procedure: BILATERAL SALPINGO OOPHORECTOMY; PERITONEAL WASHINGS;  Surgeon: Isabel Caprice, MD;  Location: [facility];  Service: Gynecology;  Laterality: Bilateral;  . TONSILLECTOMY    . TONSILLECTOMY AND ADENOIDECTOMY  1953    Family Psychiatric History: Denies  Family History:  Family History  Problem Relation Age of Onset  . Diabetes Mother   . Breast cancer Mother 21  . Hypertension Mother   . Lung cancer Father        asbestos exposure  . Heart disease Brother 65  . Breast cancer Cousin 68       paternal cousin  . Heart disease Paternal Grandmother 6  . Heart disease Paternal Grandfather  Social History:   Social History   Socioeconomic History  . Marital status: Married    Spouse name: robert  . Number of children: 0  . Years of education: Not on file  . Highest education level: Some college, no degree  Occupational History  . Occupation: retired Furniture conservator/restorer of education  Social Needs  . Financial resource strain: Not hard at all  . Food insecurity:    Worry: Never true    Inability: Never true  . Transportation needs:    Medical: No    Non-medical: No  Tobacco Use  . Smoking status: Never Smoker  . Smokeless tobacco: Never Used  Substance and Sexual Activity  . Alcohol use: Not Currently    Frequency: Never    Comment: alcohol dependence prior to 2007  . Drug use: Never  . Sexual  activity: Yes    Partners: Male    Birth control/protection: Surgical  Lifestyle  . Physical activity:    Days per week: 5 days    Minutes per session: 40 min  . Stress: Very much  Relationships  . Social connections:    Talks on phone: Not on file    Gets together: Not on file    Attends religious service: More than 4 times per year    Active member of club or organization: Yes    Attends meetings of clubs or organizations: 1 to 4 times per year    Relationship status: Married  Other Topics Concern  . Not on file  Social History Narrative  . Not on file    Additional Social History: Patient reports that she was raised by both parents.  She had a good childhood.  She almost got her PhD but dropped out prior to her dissertation.  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  Patient married her current husband 19 years ago.  She does not have any children.  Currently lives in [place].  Allergies:   Allergies  Allergen Reactions  . Hydroxyzine Anaphylaxis    Tongue swollen  . Shrimp [Shellfish Allergy] Other (See Comments)    Food poisoning     Metabolic Disorder Labs: No results found for: HGBA1C, MPG Lab Results  Component Value Date   PROLACTIN 101.9 06/20/2015   Lab Results  Component Value Date   CHOL 168 06/02/2017   TRIG 88 06/02/2017   HDL 47 06/02/2017   LDLCALC 103 06/02/2017   Lab Results  Component Value Date   TSH 2.543 07/25/2018    Therapeutic Level Labs: No results found for: LITHIUM No results found for: CBMZ No results found for: VALPROATE  Current Medications: Current Outpatient Medications  Medication Sig Dispense Refill  . cyanocobalamin (,VITAMIN B-12,) 1000 MCG/ML injection Inject 1 mL (1,000 mcg total) into the muscle every 30 (thirty) days. 3 mL 3  . FLUoxetine (PROZAC) 40 MG capsule Take 1 capsule (40 mg total) by mouth daily. 90 capsule 0  . letrozole (FEMARA) 2.5 MG tablet Take 1  tablet (2.5 mg total) by mouth daily. 90 tablet 3  . lisinopril-hydrochlorothiazide (PRINZIDE,ZESTORETIC) 20-12.5 MG tablet Take 1 tablet by mouth daily. 90 tablet 3  . LORazepam (ATIVAN) 0.5 MG tablet Take 1 tablet (0.5 mg total) by mouth daily as needed for anxiety. (Patient not taking: Reported on 03/02/2019) 30 tablet 2  . QUEtiapine (SEROQUEL) 25 MG tablet Take 1 tablet (25 mg total) by mouth at bedtime. For sleep,mood (Patient not taking: Reported on 03/02/2019)  30 tablet 0   No current facility-administered medications for this visit.     Musculoskeletal: Strength & Muscle Tone: UTA Gait & Station: UTA Patient leans: N/A  Psychiatric Specialty Exam: Review of Systems  Psychiatric/Behavioral: Positive for memory loss. The patient is nervous/anxious and has insomnia.   All other systems reviewed and are negative.   There were no vitals taken for this visit.There is no height or weight on file to calculate BMI.  General Appearance: Casual  Eye Contact:  Fair  Speech:  Normal Rate  Volume:  Normal  Mood:  Anxious  Affect:  Restricted  Thought Process:  Linear and Descriptions of Associations: Intact  Orientation:  Full (Time, Place, and Person)  Thought Content:  Logical  Suicidal Thoughts:  No  Homicidal Thoughts:  No  Memory:  Immediate;   Fair Recent;   Limited Remote;   limited  Judgement:  Fair  Insight:  Shallow  Psychomotor Activity:  Normal  Concentration:  Concentration: Fair and Attention Span: Fair  Recall:  [employer] of Knowledge:Fair  Language: Fair  Akathisia:  No  Handed:  Right  AIMS (if indicated): Denies tremors, rigidity, stiffness  Assets:  Housing Social Support  ADL's:  Intact  Cognition: Impaired,  Mild  Sleep:  Poor   Screenings: PHQ2-9     Office Visit from 11/24/2018 in [facility]  PHQ-2 Total Score  0  PHQ-9 Total Score  3      Assessment and Plan: Mirian is a 73 year old Caucasian female, married, lives in  [place], has a history of bipolar disorder, anxiety disorder, cognitive problems, history of breast cancer, history of ovarian cyst, multiple other medical issues, was evaluated by telemedicine today.  Patient is biologically predisposed given her multiple health problems.  Patient with psychosocial stressors of recent COVID-19 outbreak.  She denies suicidality.  She does not appear to be having any perceptual disturbances.  Patient has good social support from her husband.  Patient does have a history of substance abuse however currently in remission.  Patient will continue to benefit from medication changes due to anxiety and sleep problems.  Plan as noted below.  Plan Bipolar disorder-unstable Discontinue risperidone. Start Seroquel 25 mg p.o. nightly Continue Prozac 40 mg p.o. daily  For GAD-unstable Continue Prozac as prescribed Discussed with patient to taper off Ativan- provided instruction to patient as well as husband. Seroquel as prescribed Refer for CBT with therapist here in clinic.  For insomnia-unstable Start Seroquel 25 mg p.o. nightly  For cognitive changes-unstable Patient will continue to work with her neurologist-Dr. Manuella Ghazi. I have reviewed medical records in E [facility] as summarized above-dated October 14, 2018.  I have reviewed medical records in E [facility] per our consult team Dr. Dr. Weber Cooks as well as Dr. Leverne Humbles- 07/25/2018 and 01/31/2019-02/01/2019 as summarized above.  Reviewed TSH in EHR - 07/25/2018-2.543-within normal limits.  Patient will continue vitamin B12 replacement.  Discussed with patient as well as husband about the recent labs-CMP shows ALT is elevated dated 02/28/2018.  Her sodium is chronically low however currently at 133 which is an improvement when compared to past month.  Patient with confusion likely due to multiple etiologies including her electrolyte imbalance.  She will continue to follow-up with PMD as well as neurology.  Follow-up in clinic in 10 days or  sooner if needed.  I have spent atleast 45 MINUTES non face to face with patient today. More than 50 % of the time was spent for psychoeducation and  supportive psychotherapy and care coordination.  This note was generated in part or whole with voice recognition software. Voice recognition is usually quite accurate but there are transcription errors that can and very often do occur. I apologize for any typographical errors that were not detected and corrected.          Ursula Alert, MD 4/30/20209:03 AM

## 2019-03-01 NOTE — Progress Notes (Signed)
Tc on  03-01-19 @ 1:35 spoke with patient reviewed medical and surgical hx and updated. Medications and pharmacy were reviewed with no changes. Allergies were reviewed with no changes. No vitals taken as this was a phone consult.

## 2019-03-02 ENCOUNTER — Encounter: Payer: Self-pay | Admitting: Hematology and Oncology

## 2019-03-02 ENCOUNTER — Other Ambulatory Visit: Payer: Medicare Other

## 2019-03-02 ENCOUNTER — Encounter: Payer: Self-pay | Admitting: Psychiatry

## 2019-03-02 ENCOUNTER — Inpatient Hospital Stay (HOSPITAL_BASED_OUTPATIENT_CLINIC_OR_DEPARTMENT_OTHER): Payer: Medicare Other | Admitting: Hematology and Oncology

## 2019-03-02 DIAGNOSIS — R7989 Other specified abnormal findings of blood chemistry: Secondary | ICD-10-CM

## 2019-03-02 DIAGNOSIS — C50411 Malignant neoplasm of upper-outer quadrant of right female breast: Secondary | ICD-10-CM | POA: Diagnosis not present

## 2019-03-02 DIAGNOSIS — R945 Abnormal results of liver function studies: Secondary | ICD-10-CM

## 2019-03-02 DIAGNOSIS — Z170 Estrogen receptor positive status [ER+]: Secondary | ICD-10-CM

## 2019-03-02 DIAGNOSIS — E538 Deficiency of other specified B group vitamins: Secondary | ICD-10-CM | POA: Diagnosis not present

## 2019-03-02 DIAGNOSIS — R978 Other abnormal tumor markers: Secondary | ICD-10-CM

## 2019-03-02 DIAGNOSIS — C50211 Malignant neoplasm of upper-inner quadrant of right female breast: Secondary | ICD-10-CM

## 2019-03-02 LAB — CANCER ANTIGEN 27.29: CA 27.29: 25.4 U/mL (ref 0.0–38.6)

## 2019-03-02 LAB — CANCER ANTIGEN 19-9: CA 19-9: 45 U/mL — ABNORMAL HIGH (ref 0–35)

## 2019-03-08 ENCOUNTER — Other Ambulatory Visit: Payer: Self-pay

## 2019-03-08 ENCOUNTER — Encounter: Payer: Self-pay | Admitting: Psychiatry

## 2019-03-08 ENCOUNTER — Ambulatory Visit (INDEPENDENT_AMBULATORY_CARE_PROVIDER_SITE_OTHER): Payer: Medicare Other | Admitting: Psychiatry

## 2019-03-08 DIAGNOSIS — F5105 Insomnia due to other mental disorder: Secondary | ICD-10-CM

## 2019-03-08 DIAGNOSIS — F3130 Bipolar disorder, current episode depressed, mild or moderate severity, unspecified: Secondary | ICD-10-CM | POA: Diagnosis not present

## 2019-03-08 DIAGNOSIS — F411 Generalized anxiety disorder: Secondary | ICD-10-CM | POA: Diagnosis not present

## 2019-03-08 NOTE — Progress Notes (Signed)
Virtual Visit via Video Note  I connected with Kendra [name] on 03/08/19 at  4:45 PM EDT by a video enabled telemedicine application and verified that I am speaking with the correct person using two identifiers.   I discussed the limitations of evaluation and management by telemedicine and the availability of in person appointments. The patient expressed understanding and agreed to proceed.   I discussed the assessment and treatment plan with the patient. The patient was provided an opportunity to ask questions and all were answered. The patient agreed with the plan and demonstrated an understanding of the instructions.   The patient was advised to call back or seek an in-person evaluation if the symptoms worsen or if the condition fails to improve as anticipated.   [facility] MD OP Progress Note  03/08/2019 5:15 PM Kendra [name]  MRN:  [id number]  Chief Complaint:  Chief Complaint    Follow-up     HPI: Kendra [name] is a 73 year old Caucasian female who is married, lives in [place] has a history of bipolar disorder, cognitive problems, history of multifocal stage Ia right breast cancer status post treatment-lumpectomy, ovarian cyst removal was evaluated by telemedicine today.  Patient today reports she has started taking the Seroquel.  She reports she has not noticed much benefit.  She reports she sleeps only 5 hours and that is not enough for her.  She does report some blurry vision however is not sure if the medication is causing it.  She reports she has noticed it only when she writes in her car.  She has no problem getting around in her home.  She hence wants to give the medication more time and is not interested in changing it today.  Since she continues to struggle with sleep discussed adding melatonin.  She reports she has melatonin 10 mg at home available.  She will try to cut it into half and take a 5 mg along with the Seroquel.  She appeared to be alert, oriented to person place and  situation today.  She denies any other concerns today. Visit Diagnosis:    ICD-10-CM   1. Bipolar I disorder, most recent episode depressed ([place]) F31.30   2. GAD (generalized anxiety disorder) F41.1   3. Insomnia due to mental condition F51.05     Past Psychiatric History: I have reviewed past psychiatric history from my progress note on 03/01/2019.  Past trials of risperidone, Prozac, Ativan  Past Medical History:  Past Medical History:  Diagnosis Date  . Anxiety   . Breast cancer ([place]) 03/2018   right breast  . Complication of anesthesia   . Depression   . Family history of breast cancer 03/24/2018  . Family history of lung cancer 03/24/2018  . History of alcohol dependence ([facility]) 2007   no ETOH; resolved since 2007  . History of psychosis 2014   due to sleep disturbance  . Hypertension   . Personal history of radiation therapy   . PONV (postoperative nausea and vomiting)     Past Surgical History:  Procedure Laterality Date  . BREAST LUMPECTOMY WITH RADIOACTIVE SEED AND SENTINEL LYMPH NODE BIOPSY Right 04/27/2018   Procedure: RIGHT BREAST LUMPECTOMY WITH RADIOACTIVE SEED X 2 AND RIGHT SENTINEL LYMPH NODE BIOPSY;  Surgeon: [name], MD;  Location: [facility];  Service: General;  Laterality: Right;  . LAPAROTOMY N/A 02/10/2018   Procedure: EXPLORATORY LAPAROTOMY;  Surgeon: Isabel Caprice, MD;  Location: [facility];  Service: Gynecology;  Laterality: N/A;  . MASS  EXCISION  01/2018   abdominal  . OVARIAN CYST REMOVAL    . SALPINGOOPHORECTOMY Bilateral 02/10/2018   Procedure: BILATERAL SALPINGO OOPHORECTOMY; PERITONEAL WASHINGS;  Surgeon: Isabel Caprice, MD;  Location: [facility];  Service: Gynecology;  Laterality: Bilateral;  . TONSILLECTOMY    . TONSILLECTOMY AND ADENOIDECTOMY  1953    Family Psychiatric History: Reviewed family psychiatric history from my progress note on 03/01/2019  Family History:  Family History  Problem Relation Age of Onset  .  Diabetes Mother   . Breast cancer Mother 64  . Hypertension Mother   . Lung cancer Father        asbestos exposure  . Heart disease Brother 87  . Breast cancer Cousin 24       paternal cousin  . Heart disease Paternal Grandmother 29  . Heart disease Paternal Grandfather     Social History: Reviewed social history from my progress note on 03/01/2019 Social History   Socioeconomic History  . Marital status: Married    Spouse name: robert  . Number of children: 0  . Years of education: Not on file  . Highest education level: Some college, no degree  Occupational History  . Occupation: retired Furniture conservator/restorer of education  Social Needs  . Financial resource strain: Not hard at all  . Food insecurity:    Worry: Never true    Inability: Never true  . Transportation needs:    Medical: No    Non-medical: No  Tobacco Use  . Smoking status: Never Smoker  . Smokeless tobacco: Never Used  Substance and Sexual Activity  . Alcohol use: Not Currently    Frequency: Never    Comment: alcohol dependence prior to 2007  . Drug use: Never  . Sexual activity: Yes    Partners: Male    Birth control/protection: Surgical  Lifestyle  . Physical activity:    Days per week: 5 days    Minutes per session: 40 min  . Stress: Very much  Relationships  . Social connections:    Talks on phone: Not on file    Gets together: Not on file    Attends religious service: More than 4 times per year    Active member of club or organization: Yes    Attends meetings of clubs or organizations: 1 to 4 times per year    Relationship status: Married  Other Topics Concern  . Not on file  Social History Narrative  . Not on file    Allergies:  Allergies  Allergen Reactions  . Hydroxyzine Anaphylaxis    Tongue swollen  . Shrimp [Shellfish Allergy] Other (See Comments)    Food poisoning     Metabolic Disorder Labs: No results found for: HGBA1C, MPG Lab Results  Component Value Date    PROLACTIN 101.9 06/20/2015   Lab Results  Component Value Date   CHOL 168 06/02/2017   TRIG 88 06/02/2017   HDL 47 06/02/2017   LDLCALC 103 06/02/2017   Lab Results  Component Value Date   TSH 2.543 07/25/2018   TSH 3.51 06/02/2017    Therapeutic Level Labs: No results found for: LITHIUM No results found for: VALPROATE No components found for:  CBMZ  Current Medications: Current Outpatient Medications  Medication Sig Dispense Refill  . cyanocobalamin (,VITAMIN B-12,) 1000 MCG/ML injection Inject 1 mL (1,000 mcg total) into the muscle every 30 (thirty) days. 3 mL 3  . FLUoxetine (PROZAC) 40 MG capsule Take 1 capsule (40 mg total)  by mouth daily. 90 capsule 0  . letrozole (FEMARA) 2.5 MG tablet Take 1 tablet (2.5 mg total) by mouth daily. 90 tablet 3  . lisinopril-hydrochlorothiazide (PRINZIDE,ZESTORETIC) 20-12.5 MG tablet Take 1 tablet by mouth daily. 90 tablet 3  . LORazepam (ATIVAN) 0.5 MG tablet Take 1 tablet (0.5 mg total) by mouth daily as needed for anxiety. (Patient not taking: Reported on 03/02/2019) 30 tablet 2  . QUEtiapine (SEROQUEL) 25 MG tablet Take 1 tablet (25 mg total) by mouth at bedtime. For sleep,mood (Patient not taking: Reported on 03/02/2019) 30 tablet 0   No current facility-administered medications for this visit.      Musculoskeletal: Strength & Muscle Tone: UTA Gait & Station: UTA Patient leans: N/A  Psychiatric Specialty Exam: Review of Systems  Psychiatric/Behavioral: Positive for memory loss. The patient is nervous/anxious and has insomnia.   All other systems reviewed and are negative.   There were no vitals taken for this visit.There is no height or weight on file to calculate BMI.  General Appearance: Casual  Eye Contact:  Fair  Speech:  Clear and Coherent  Volume:  Normal  Mood:  Anxious  Affect:  Appropriate  Thought Process:  Goal Directed and Descriptions of Associations: Intact  Orientation:  Full (Time, Place, and Person)   Thought Content: Logical   Suicidal Thoughts:  No  Homicidal Thoughts:  No  Memory:  Immediate;   Fair Recent;   Fair Remote;   Fair  Judgement:  Fair  Insight:  Fair  Psychomotor Activity:  Normal  Concentration:  Concentration: Fair and Attention Span: Fair  Recall:  [employer] of Knowledge: Fair  Language: Fair  Akathisia:  No  Handed:  Right  AIMS (if indicated): denies tremors, rigidity, stiffness  Assets:  Communication Skills Desire for Improvement Housing Social Support  ADL's:  Intact  Cognition: WNL  Sleep:  Restless   Screenings: PHQ2-9     Office Visit from 11/24/2018 in [facility]  PHQ-2 Total Score  0  PHQ-9 Total Score  3       Assessment and Plan: Meela is a 73 year old Caucasian female, married, lives in [place], has a history of bipolar disorder, anxiety disorder, cognitive problems, history of breast cancer, history of ovarian cyst, was evaluated by telemedicine today.  Patient is biologically predisposed given her multiple health problems patient with psychosocial stressors of recent COVID-19 outbreak.  Patient continues to struggle with anxiety and sleep problems.  We will continue to need medication management.  Plan Bipolar disorder-unstable Seroquel 25 mg p.o. nightly Prozac 40 mg p.o. daily Will not make any readjustment with her Seroquel today since she does have some mild side effects.  She will monitor herself carefully.   GAD- some improvement Patient was referred for CBT.  She has upcoming appointment with Ms. Alden Hipp Continue Prozac as prescribed. Discussed with patient to taper of Ativan-provided instruction to patient.   Insomnia-unstable Seroquel 25 mg p.o. nightly Add melatonin 5 mg p.o. nightly  For cognitive changes-chronic-she will continue to work with her neurologist-Dr. Manuella Ghazi.  Follow-up in clinic in 2 weeks or sooner if needed.  I have spent atleast 15 minutes non face to face with patient today.  More than 50 % of the time was spent for psychoeducation and supportive psychotherapy and care coordination.  This note was generated in part or whole with voice recognition software. Voice recognition is usually quite accurate but there are transcription errors that can and very often do occur. I  apologize for any typographical errors that were not detected and corrected.       Ursula Alert, MD 03/08/2019, 5:15 PM

## 2019-03-13 ENCOUNTER — Ambulatory Visit: Payer: Medicare Other | Admitting: General Surgery

## 2019-03-15 ENCOUNTER — Ambulatory Visit (INDEPENDENT_AMBULATORY_CARE_PROVIDER_SITE_OTHER): Payer: Medicare Other | Admitting: General Surgery

## 2019-03-15 ENCOUNTER — Encounter: Payer: Self-pay | Admitting: General Surgery

## 2019-03-15 ENCOUNTER — Other Ambulatory Visit: Payer: Self-pay

## 2019-03-15 VITALS — BP 136/81 | HR 69 | Temp 97.5°F | Resp 14 | Ht 67.0 in | Wt 162.0 lb

## 2019-03-15 DIAGNOSIS — K432 Incisional hernia without obstruction or gangrene: Secondary | ICD-10-CM

## 2019-03-15 NOTE — Progress Notes (Signed)
Kendra [name] returns to clinic today for reevaluation of her ventral hernias.  I initially saw her about 6 months ago.  My initial consult note is partially copied here:   "She has been referred for surgical evaluation of a mass on her abdomen. 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 Incidentally, on imaging performed for pre-op evaluation for that operation, a breast mass was detected.  This proved to be carcinoma. She had breast-conserving surgery, followed by radiation and letrozole therapy.  She is followed by Dr. Mike [name] in the cancer center here at [facility]."  At the time of that visit, she was having a lot of difficulty with constipation, which exacerbated the hernia symptoms.  She says that she is doing better, taking a stool softener regularly and drinking prune juice.  She denies any pain at her hernia sites.  There are no symptoms of incarceration or strangulation.  She says that she is always able to reduce the bulge.  Past Medical History:  Diagnosis Date  . Anxiety   . Breast cancer ([facility]) 03/2018   right breast  . Complication of anesthesia   . Depression   . Family history of breast cancer 03/24/2018  . Family history of lung cancer 03/24/2018  . History of alcohol dependence ([facility]) 2007   no ETOH; resolved since 2007  . History of psychosis 2014   due to sleep disturbance  . Hypertension   . Personal history of radiation therapy   . PONV (postoperative nausea and vomiting)    Past Surgical History:  Procedure Laterality Date  . BREAST LUMPECTOMY WITH RADIOACTIVE SEED AND SENTINEL LYMPH NODE BIOPSY Right 04/27/2018   Procedure: RIGHT BREAST LUMPECTOMY WITH RADIOACTIVE SEED X 2 AND RIGHT SENTINEL LYMPH NODE BIOPSY;  Surgeon: [name], MD;  Location: [facility];  Service: General;  Laterality: Right;   . LAPAROTOMY N/A 02/10/2018   Procedure: EXPLORATORY LAPAROTOMY;  Surgeon: Isabel Caprice, MD;  [facility];  Service: Gynecology;  Laterality: N/A;  . MASS EXCISION  01/2018   abdominal  . OVARIAN CYST REMOVAL    . SALPINGOOPHORECTOMY Bilateral 02/10/2018   Procedure: BILATERAL SALPINGO OOPHORECTOMY; PERITONEAL WASHINGS;  Surgeon: Isabel Caprice, MD;  [facility];  Service: Gynecology;  Laterality: Bilateral;  . TONSILLECTOMY    . TONSILLECTOMY AND ADENOIDECTOMY  1953   Family History  Problem Relation Age of Onset  . Diabetes Mother   . Breast cancer Mother 69  . Hypertension Mother   . Lung cancer Father        asbestos exposure  . Heart disease Brother 26  . Breast cancer Cousin 70       paternal cousin  . Heart disease Paternal Grandmother 37  . Heart disease Paternal Grandfather    Social History   Tobacco Use  . Smoking status: Never Smoker  . Smokeless tobacco: Never Used  Substance Use Topics  . Alcohol use: Not Currently    Frequency: Never    Comment: alcohol dependence prior to 2007  . Drug use: Never   Current Meds  Medication Sig  . cyanocobalamin (,VITAMIN B-12,) 1000 MCG/ML injection Inject 1 mL (1,000 mcg total) into the muscle every 30 (thirty) days.  Marland Kitchen FLUoxetine (PROZAC) 40 MG capsule Take 1 capsule (40 mg total) by mouth daily.  Marland Kitchen letrozole (FEMARA) 2.5 MG tablet Take 1 tablet (  2.5 mg total) by mouth daily.  Marland Kitchen lisinopril-hydrochlorothiazide (PRINZIDE,ZESTORETIC) 20-12.5 MG tablet Take 1 tablet by mouth daily.  Marland Kitchen LORazepam (ATIVAN) 0.5 MG tablet Take 1 tablet (0.5 mg total) by mouth daily as needed for anxiety.  Marland Kitchen QUEtiapine (SEROQUEL) 25 MG tablet Take 1 tablet (25 mg total) by mouth at bedtime. For sleep,mood   Allergies  Allergen Reactions  . Hydroxyzine Anaphylaxis    Tongue swollen  . Shrimp [Shellfish Allergy] Other (See Comments)    Food poisoning    Physical Exam: Gen: alert, responds appropriately to questions. HEENT: Left  eye ptosis, no scleral icterus.  The remainder of her face is covered with a mask secondary to COVID-19 restrictions Cardiovascular: Regular rate, well-perfused Lungs: Normal work of breathing on room air Abdomen: Soft, nontender, nondistended.  She has a vertical midline incision.  Adjacent to the umbilicus, there is an approximately 5 cm fascial defect palpated.  With Valsalva maneuver, there is a bulge appreciated, but this easily reduces with minimal manual pressure. Skin: Innumerable seborrheic keratoses  Imaging: CLINICAL DATA:  Elevated liver function tests. Surgery for benign cystic ovarian neoplasm and right breast carcinoma approximately 6 months ago. Ventral hernia.  EXAM: CT ABDOMEN AND PELVIS WITH CONTRAST  TECHNIQUE: Multidetector CT imaging of the abdomen and pelvis was performed using the standard protocol following bolus administration of intravenous contrast.  CONTRAST:  16mL ISOVUE-300 IOPAMIDOL (ISOVUE-300) INJECTION 61%  COMPARISON:  02/03/2018  FINDINGS: Lower Chest: No acute findings.  Hepatobiliary: No hepatic masses identified. Relative enlargement of the left hepatic lobe compared to the right, raising suspicion for hepatic cirrhosis. Gallbladder is unremarkable.  Pancreas:  No mass or inflammatory changes.  Spleen: Within normal limits in size and appearance.  Adrenals/Urinary Tract: No masses identified. No evidence of hydronephrosis.  Stomach/Bowel: No evidence of obstruction, inflammatory process or abnormal fluid collections. Normal appendix visualized. Large colonic stool burden noted. A small midline epigastric ventral hernia is seen containing only fat. A moderate paraumbilical hernia is seen containing transverse colon. A small suprapubic ventral hernia is seen containing a loop of small bowel.  Vascular/Lymphatic: No pathologically enlarged lymph nodes. No abdominal aortic aneurysm. Aortic atherosclerosis.  Reproductive:  Patient has undergone surgical resection of large complex cystic pelvic mass since previous study. Normal appearance of uterus. Adnexal regions are unremarkable. No evidence of peritoneal soft tissue density or ascites.  Other:  None.  Musculoskeletal: No suspicious bone lesions identified. Lumbar spine facet DJD noted with grade 1 anterolisthesis at L4-5.  IMPRESSION: No acute findings.  Interval surgical resection of large cystic pelvic mass. No evidence of residual or metastatic neoplasm.  Suspect hepatic cirrhosis.  Three midline ventral abdominal wall hernias, as described above.  Large stool burden noted; recommend clinical correlation for possible constipation.  Assessment and plan: This is a 73 year old woman with multiple ventral incisional hernias.  She is asymptomatic.  She denies any symptoms of incarceration, strangulation, or other concerning findings.  She does not desire surgical intervention at this time.  She was provided with a list of symptoms to be aware of and told to either contact our office or present to the emergency department should she experience any of them.  She is also welcome to contact us in the future should she desire surgical repair of her multiple ventral hernias.  We will see her on an as-needed basis.

## 2019-03-15 NOTE — Patient Instructions (Signed)
The patient is aware to call back for any questions or new concerns.  Hernia, Adult     A hernia is the bulging of an organ or tissue through a weak spot in the muscles of the abdomen (abdominal wall). Hernias develop most often near the belly button (navel) or the area where the leg meets the lower abdomen (groin). Common types of hernias include:  Incisional hernia. This type bulges through a scar from an abdominal surgery.  Umbilical hernia. This type develops near the navel.  Inguinal hernia. This type develops in the groin or scrotum.  Femoral hernia. This type develops under the groin, in the upper thigh area.  Hiatal hernia. This type occurs when part of the stomach slides above the muscle that separates the abdomen from the chest (diaphragm). What are the causes? This condition may be caused by:  Heavy lifting.  Coughing over a long period of time.  Straining to have a bowel movement. Constipation can lead to straining.  An incision made during an abdominal surgery.  A physical problem that is present at birth (congenital defect).  Being overweight or obese.  Smoking.  Excess fluid in the abdomen.  Undescended testicles in males. What are the signs or symptoms? The main symptom is a skin-colored, rounded bulge in the area of the hernia. However, a bulge may not always be present. It may grow bigger or be more visible when you cough or strain (such as when lifting something heavy). A hernia that can be pushed back into the area (is reducible) rarely causes pain. A hernia that cannot be pushed back into the area (is incarcerated) may lose its blood supply (become strangulated). A hernia that is incarcerated may cause:  Pain.  Fever.  Nausea and vomiting.  Swelling.  Constipation. How is this diagnosed? A hernia may be diagnosed based on:  Your symptoms and medical history.  A physical exam. Your health care provider may ask you to cough or move in certain  ways to see if the hernia becomes visible.  Imaging tests, such as: ? X-rays. ? Ultrasound. ? CT scan. How is this treated? A hernia that is small and painless may not need to be treated. A hernia that is large or painful may be treated with surgery. Inguinal hernias may be treated with surgery to prevent incarceration or strangulation. Strangulated hernias are always treated with surgery because a lack of blood supply to the trapped organ or tissue can cause it to die. Surgery to treat a hernia involves pushing the bulge back into place and repairing the weak area of the muscle or abdominal wall. Follow these instructions at home: Activity  Avoid straining.  Do not lift anything that is heavier than 10 lb (4.5 kg), or the limit that you are told, until your health care provider says that it is safe.  When lifting heavy objects, lift with your leg muscles, not your back muscles. Preventing constipation  Take actions to prevent constipation. Constipation leads to straining with bowel movements, which can make a hernia worse or cause a hernia repair to break down. Your health care provider may recommend that you: ? Drink enough fluid to keep your urine pale yellow. ? Eat foods that are high in fiber, such as fresh fruits and vegetables, whole grains, and beans. ? Limit foods that are high in fat and processed sugars, such as fried or sweet foods. ? Take an over-the-counter or prescription medicine for constipation. General instructions  When coughing, try  to cough gently.  You may try to push the hernia back in place by very gently pressing on it while lying down. Do not try to force the bulge back in if it will not push in easily.  If you are overweight, work with your health care provider to lose weight safely.  Do not use any products that contain nicotine or tobacco, such as cigarettes and e-cigarettes. If you need help quitting, ask your health care provider.  If you are  scheduled for hernia repair, watch your hernia for any changes in shape, size, or color. Tell your health care provider about any changes or new symptoms.  Take over-the-counter and prescription medicines only as told by your health care provider.  Keep all follow-up visits as told by your health care provider. This is important. Contact a health care provider if:  You develop new pain, swelling, or redness around your hernia.  You have signs of constipation, such as: ? Fewer bowel movements in a week than normal. ? Difficulty having a bowel movement. ? Stools that are dry, hard, or larger than normal. Get help right away if:  You have a fever.  You have abdomen pain that gets worse.  You feel nauseous or you vomit.  You cannot push the hernia back in place by very gently pressing on it while lying down. Do not try to force the bulge back in if it will not push in easily.  The hernia: ? Changes in shape, size, or color. ? Feels hard or tender. These symptoms may represent a serious problem that is an emergency. Do not wait to see if the symptoms will go away. Get medical help right away. Call your local emergency services (911 in the [place] Summary  A hernia is the bulging of an organ or tissue through a weak spot in the muscles of the abdomen (abdominal wall).  The main symptom is a skin-colored, rounded lump (bulge) in the hernia area. However, a bulge may not always be present. It may grow bigger or more visible when you cough or strain (such as when having a bowel movement).  A hernia that is small and painless may not need to be treated. A hernia that is large or painful may be treated with surgery.  Surgery to treat a hernia involves pushing the bulge back into place and repairing the weak part of the abdomen. This information is not intended to replace advice given to you by your health care provider. Make sure you discuss any questions you have with your health care  provider. Document Released: 10/19/2005 Document Revised: 07/21/2017 Document Reviewed: 07/21/2017 Elsevier Interactive Patient Education  2019 [employer].

## 2019-03-21 ENCOUNTER — Other Ambulatory Visit: Payer: Self-pay

## 2019-03-21 ENCOUNTER — Ambulatory Visit: Payer: Medicare Other | Admitting: Licensed Clinical Social Worker

## 2019-03-22 ENCOUNTER — Encounter: Payer: Self-pay | Admitting: Licensed Clinical Social Worker

## 2019-03-22 ENCOUNTER — Ambulatory Visit (INDEPENDENT_AMBULATORY_CARE_PROVIDER_SITE_OTHER): Payer: Medicare Other | Admitting: Psychiatry

## 2019-03-22 ENCOUNTER — Ambulatory Visit (INDEPENDENT_AMBULATORY_CARE_PROVIDER_SITE_OTHER): Payer: Medicare Other | Admitting: Licensed Clinical Social Worker

## 2019-03-22 ENCOUNTER — Encounter: Payer: Self-pay | Admitting: Psychiatry

## 2019-03-22 DIAGNOSIS — F5105 Insomnia due to other mental disorder: Secondary | ICD-10-CM

## 2019-03-22 DIAGNOSIS — F411 Generalized anxiety disorder: Secondary | ICD-10-CM | POA: Diagnosis not present

## 2019-03-22 DIAGNOSIS — F09 Unspecified mental disorder due to known physiological condition: Secondary | ICD-10-CM

## 2019-03-22 DIAGNOSIS — F3130 Bipolar disorder, current episode depressed, mild or moderate severity, unspecified: Secondary | ICD-10-CM | POA: Diagnosis not present

## 2019-03-22 MED ORDER — QUETIAPINE FUMARATE 25 MG PO TABS: 25.0000 mg | ORAL_TABLET | Freq: Every day | ORAL | 0 refills | Status: DC

## 2019-03-22 NOTE — Progress Notes (Signed)
Virtual Visit via Video Note  I connected with Kendra [name] on 03/22/19 at  4:45 PM EDT by a video enabled telemedicine application and verified that I am speaking with the correct person using two identifiers.   I discussed the limitations of evaluation and management by telemedicine and the availability of in person appointments. The patient expressed understanding and agreed to proceed.    I discussed the assessment and treatment plan with the patient. The patient was provided an opportunity to ask questions and all were answered. The patient agreed with the plan and demonstrated an understanding of the instructions.   The patient was advised to call back or seek an in-person evaluation if the symptoms worsen or if the condition fails to improve as anticipated.   [facility] MD OP Progress Note  03/22/2019 4:40 PM [name]  MRN:  [id number]  Chief Complaint:  Chief Complaint    Follow-up     HPI: Kendra [name] is a 73 year old Caucasian female, married, lives in [place], has a history of bipolar disorder, cognitive problems, history of multifocal stage I right breast cancer status post treatment-lumpectomy, ovarian cyst removal was evaluated by telemedicine today.  [name] today reports she is doing ok on the seroquel so far. She tried the seroquel and melatonin combination and that has been helpful with sleep. Since the past few weeks she has been sleeping through the night.  She is tolerating her medications OK and denies side effects.  She is compliant on her Prozac . She reports mood as improving. She has been able to cope with the covid 19 outbreak and social isolation OK .She reports she is able to function OK with her day to day activities. She continues to have some memory issues and has been having some trouble with operating her cell phone on and off. Her husband has been supportive.She has an upcoming appointment with neurology coming up.  She had an appointment with Merleen Nicely ,  therapist which went well. She is motivated to keep her next appointment in June.  She appeared to be alert , oriented to person ,situation and time.   Visit Diagnosis:    ICD-10-CM   1. Bipolar I disorder, most recent episode depressed (HCC) F31.30 QUEtiapine (SEROQUEL) 25 MG tablet  2. GAD (generalized anxiety disorder) F41.1   3. Insomnia due to mental condition F51.05   4. Mild cognitive disorder F09     Past Psychiatric History: Reviewed past psychiatric history from my progress note on 03/01/2019.  Past trials of risperidone, Prozac, Ativan  Past Medical History:  Past Medical History:  Diagnosis Date  . Anxiety   . Breast cancer ([place]) 03/2018   right breast  . Complication of anesthesia   . Depression   . Family history of breast cancer 03/24/2018  . Family history of lung cancer 03/24/2018  . History of alcohol dependence ([place]) 2007   no ETOH; resolved since 2007  . History of psychosis 2014   due to sleep disturbance  . Hypertension   . Personal history of radiation therapy   . PONV (postoperative nausea and vomiting)     Past Surgical History:  Procedure Laterality Date  . BREAST LUMPECTOMY WITH RADIOACTIVE SEED AND SENTINEL LYMPH NODE BIOPSY Right 04/27/2018   Procedure: RIGHT BREAST LUMPECTOMY WITH RADIOACTIVE SEED X 2 AND RIGHT SENTINEL LYMPH NODE BIOPSY;  Surgeon: [name], MD;  Location: [facility];  Service: General;  Laterality: Right;  . LAPAROTOMY N/A 02/10/2018   Procedure: EXPLORATORY LAPAROTOMY;  Surgeon: Isabel Caprice, MD;  Location: [facility];  Service: Gynecology;  Laterality: N/A;  . MASS EXCISION  01/2018   abdominal  . OVARIAN CYST REMOVAL    . SALPINGOOPHORECTOMY Bilateral 02/10/2018   Procedure: BILATERAL SALPINGO OOPHORECTOMY; PERITONEAL WASHINGS;  Surgeon: Isabel Caprice, MD;  Location: [facility];  Service: Gynecology;  Laterality: Bilateral;  . TONSILLECTOMY    . TONSILLECTOMY AND ADENOIDECTOMY  1953    Family  Psychiatric History: Reviewed family psychiatric history from my progress note on 03/01/2019.  Family History:  Family History  Problem Relation Age of Onset  . Diabetes Mother   . Breast cancer Mother 53  . Hypertension Mother   . Lung cancer Father        asbestos exposure  . Heart disease Brother 63  . Breast cancer Cousin 29       paternal cousin  . Heart disease Paternal Grandmother 53  . Heart disease Paternal Grandfather     Social History: Reviewed social history from my progress note on 03/01/2019. Social History   Socioeconomic History  . Marital status: Married    Spouse name: robert  . Number of children: 0  . Years of education: Not on file  . Highest education level: Some college, no degree  Occupational History  . Occupation: retired Furniture conservator/restorer of education  Social Needs  . Financial resource strain: Not hard at all  . Food insecurity:    Worry: Never true    Inability: Never true  . Transportation needs:    Medical: No    Non-medical: No  Tobacco Use  . Smoking status: Never Smoker  . Smokeless tobacco: Never Used  Substance and Sexual Activity  . Alcohol use: Not Currently    Frequency: Never    Comment: alcohol dependence prior to 2007  . Drug use: Never  . Sexual activity: Yes    Partners: Male    Birth control/protection: Surgical  Lifestyle  . Physical activity:    Days per week: 5 days    Minutes per session: 40 min  . Stress: Very much  Relationships  . Social connections:    Talks on phone: Not on file    Gets together: Not on file    Attends religious service: More than 4 times per year    Active member of club or organization: Yes    Attends meetings of clubs or organizations: 1 to 4 times per year    Relationship status: Married  Other Topics Concern  . Not on file  Social History Narrative  . Not on file    Allergies:  Allergies  Allergen Reactions  . Hydroxyzine Anaphylaxis    Tongue swollen  . Shrimp  [Shellfish Allergy] Other (See Comments)    Food poisoning     Metabolic Disorder Labs: No results found for: HGBA1C, MPG Lab Results  Component Value Date   PROLACTIN 101.9 06/20/2015   Lab Results  Component Value Date   CHOL 168 06/02/2017   TRIG 88 06/02/2017   HDL 47 06/02/2017   LDLCALC 103 06/02/2017   Lab Results  Component Value Date   TSH 2.543 07/25/2018   TSH 3.51 06/02/2017    Therapeutic Level Labs: No results found for: LITHIUM No results found for: VALPROATE No components found for:  CBMZ  Current Medications: Current Outpatient Medications  Medication Sig Dispense Refill  . cyanocobalamin (,VITAMIN B-12,) 1000 MCG/ML injection Inject 1 mL (1,000 mcg total) into the muscle every 30 (  thirty) days. 3 mL 3  . FLUoxetine (PROZAC) 40 MG capsule Take 1 capsule (40 mg total) by mouth daily. 90 capsule 0  . letrozole (FEMARA) 2.5 MG tablet Take 1 tablet (2.5 mg total) by mouth daily. 90 tablet 3  . lisinopril-hydrochlorothiazide (PRINZIDE,ZESTORETIC) 20-12.5 MG tablet Take 1 tablet by mouth daily. 90 tablet 3  . LORazepam (ATIVAN) 0.5 MG tablet Take 1 tablet (0.5 mg total) by mouth daily as needed for anxiety. 30 tablet 2  . QUEtiapine (SEROQUEL) 25 MG tablet Take 1 tablet (25 mg total) by mouth at bedtime. For sleep,mood 90 tablet 0   No current facility-administered medications for this visit.      Musculoskeletal: Strength & Muscle Tone: within normal limits Gait & Station: normal Patient leans: N/A  Psychiatric Specialty Exam: Review of Systems  Psychiatric/Behavioral: The patient is nervous/anxious and has insomnia.   All other systems reviewed and are negative.   There were no vitals taken for this visit.There is no height or weight on file to calculate BMI.  General Appearance: Casual  Eye Contact:  Fair  Speech:  Normal Rate  Volume:  Normal  Mood:  Anxious improving  Affect:  Congruent  Thought Process:  Goal Directed and Descriptions of  Associations: Intact  Orientation:  Full (Time, Place, and Person)  Thought Content: Logical   Suicidal Thoughts:  No  Homicidal Thoughts:  No  Memory:  Immediate;   Fair Recent;   Fair Remote;   limited  Judgement:  Fair  Insight:  Fair  Psychomotor Activity:  Normal  Concentration:  Concentration: Fair and Attention Span: Fair  Recall:  [employer] of Knowledge: Fair  Language: Fair  Akathisia:  No  Handed:  Right  AIMS (if indicated):denies tremors, rigidity,stiffness  Assets:  Communication Skills Desire for Improvement Social Support  ADL's:  Intact  Cognition: Impaired,  Mild unspecified -  Sleep:  Fair improving   Screenings: PHQ2-9     Office Visit from 11/24/2018 in [facility]  PHQ-2 Total Score  0  PHQ-9 Total Score  3       Assessment and Plan: Katana is a 73 year old Caucasian female, married, lives in [place], has a history of bipolar disorder, GAD, cognitive problems, history of breast cancer, history of ovarian cyst was evaluated by telemedicine today.  Patient is biologically predisposed given her multiple health problems.She also has psychosocial stressors of recent COVID-19 outbreak. Patient is currently making progress on current medication regimen. Plan as noted below.   Plan Bipolar disorder- improving Seroquel 25 mg p.o. nightly Prozac 40 mg p.o. daily  For GAD- improving Patient to continue psychotherapy sessions with Ms. Cecilie Lowers. Continue Prozac.  Insomnia-improving Seroquel 25 mg p.o. nightly Melatonin 10 mg p.o. nightly  For cognitive changes-patient will continue to work with her neurologist-Dr. Manuella Ghazi.  Follow-up in clinic in 4 weeks or sooner if needed.Appointment scheduled for June 17 th 3:30 PM.  I have spent atleast 15 minutes non face to face with patient today. More than 50 % of the time was spent for psychoeducation and supportive psychotherapy and care coordination.  This note was generated in part or whole with  voice recognition software. Voice recognition is usually quite accurate but there are transcription errors that can and very often do occur. I apologize for any typographical errors that were not detected and corrected.        Ursula Alert, MD 03/22/2019, 4:40 PM
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
## 2019-03-28 ENCOUNTER — Other Ambulatory Visit: Payer: Self-pay | Admitting: Psychiatry

## 2019-03-28 DIAGNOSIS — F3130 Bipolar disorder, current episode depressed, mild or moderate severity, unspecified: Secondary | ICD-10-CM

## 2019-04-03 ENCOUNTER — Telehealth: Payer: Self-pay

## 2019-04-03 NOTE — Telephone Encounter (Signed)
Attempted to call husband - left message to call back.

## 2019-04-03 NOTE — Telephone Encounter (Signed)
pt husband called states he had some question for dr. Shea Evans about his wife memory issues and him leaving because of work

## 2019-04-06 ENCOUNTER — Telehealth: Payer: Self-pay

## 2019-04-06 DIAGNOSIS — F319 Bipolar disorder, unspecified: Secondary | ICD-10-CM

## 2019-04-06 MED ORDER — QUETIAPINE FUMARATE 50 MG PO TABS: 50.0000 mg | ORAL_TABLET | Freq: Every day | ORAL | 0 refills | Status: DC

## 2019-04-06 NOTE — Telephone Encounter (Signed)
he called states that his wife feels like she has the virus and she doesnt want to take the medication because someone else could use it.  she is having alot of anxiety , and "spells"

## 2019-04-06 NOTE — Telephone Encounter (Signed)
Spoke to [place] - patient appears to be paranoid , anxious , feels she caused the covid pandemic by doing something on her phone. Discussed increasing seroquel to 50 mg. He will monitor her meds. Has appointment with Merleen Nicely tomorrow.

## 2019-04-07 ENCOUNTER — Other Ambulatory Visit: Payer: Self-pay

## 2019-04-07 ENCOUNTER — Encounter: Payer: Self-pay | Admitting: Licensed Clinical Social Worker

## 2019-04-07 ENCOUNTER — Ambulatory Visit (INDEPENDENT_AMBULATORY_CARE_PROVIDER_SITE_OTHER): Payer: Medicare Other | Admitting: Licensed Clinical Social Worker

## 2019-04-07 DIAGNOSIS — F09 Unspecified mental disorder due to known physiological condition: Secondary | ICD-10-CM | POA: Diagnosis not present

## 2019-04-07 DIAGNOSIS — F319 Bipolar disorder, unspecified: Secondary | ICD-10-CM

## 2019-04-07 NOTE — Progress Notes (Signed)
Virtual Visit via Telephone Note  I connected with Kendra [name] on 04/07/19 at  9:00 AM EDT by telephone and verified that I am speaking with the correct person using two identifiers.   I discussed the limitations, risks, security and privacy concerns of performing an evaluation and management service by telephone and the availability of in person appointments. I also discussed with the patient that there may be a patient responsible charge related to this service. The patient expressed understanding and agreed to proceed.   I discussed the assessment and treatment plan with the patient. The patient was provided an opportunity to ask questions and all were answered. The patient agreed with the plan and demonstrated an understanding of the instructions.   The patient was advised to call back or seek an in-person evaluation if the symptoms worsen or if the condition fails to improve as anticipated.  I provided 30 minutes of non-face-to-face time during this encounter.   Kendra Hipp, LCSW    THERAPIST PROGRESS NOTE  Session Time: 0900  Participation Level: Active  Behavioral Response: NAAlertNA  Type of Therapy: Individual Therapy  Treatment Goals addressed: Coping  Interventions: Supportive  Summary: Kendra [name] is a 73 y.o. female who presents with continued symptoms related to her diagnosis. Kendra [name] was joined by her husband, Kendra [name], for her session today. Kendra [name] was unable to discuss symptoms or how she's been feeling since our last session. She was able to articulate feeling she caused COVID-19, but was not able to discuss why she felt that way. Her husband stated she is having increased trouble articulating herself and forming words. He reports feeling the thoughts are there but she is unable to speak them. LCSW attempted to discuss this idea with Kendra [name] and provide comfort around that idea; however, Kendra [name] was unable to respond. Kendra [name] reported this has been going on for a few  weeks, but has gotten increasingly worse over the last week. He reports the MD recently increased Kendra [name]'s medications, but they have not seen a noticeable difference. LCSW provided Kendra [name] with a few tactics he could utilize at home, such as thought challenging techniques he could help Kendra [name] with--though if she is unable to process information at this time, it would not be helpful to do so. LCSW also provided Kendra [name] with information on support groups via Kendra [name] as well as support groups for dementia. LCSW also stated she would ask MD to contact pt and Kendra [name] today to evaluate need for change in medications or other levels of care. We discussed bringing Kendra [name] to be evaluated for possible inpatient treatment, however Kendra [name] reported wanting to save that as a last resort. LCSW provided Kendra [name] with her email address in case there is a need to communicate outside appointments. Kendra [name] expressed agreement and understanding with information provided.    Suicidal/Homicidal: No  Therapist Response: Kendra [name] continues to work towards her tx goals but has not yet reached them. At this time, Kendra [name]'s paranoia has increased and she is unable to articulate words to discuss her feelings or behaviors. We will continue to work on managing anxiety symptoms moving forward.   Plan: Return again in 2 weeks.  Diagnosis: Axis I: Bipolar 1 Disorder    Axis II: No diagnosis    Kendra Hipp, LCSW 04/07/2019

## 2019-04-10 ENCOUNTER — Telehealth: Payer: Self-pay

## 2019-04-10 ENCOUNTER — Encounter: Payer: Self-pay | Admitting: Emergency Medicine

## 2019-04-10 ENCOUNTER — Other Ambulatory Visit: Payer: Self-pay

## 2019-04-10 ENCOUNTER — Emergency Department
Admission: EM | Admit: 2019-04-10 | Discharge: 2019-04-11 | Disposition: A | Discharge status: Other Acute Inpatient Hospital | Payer: Medicare Other | Attending: Emergency Medicine | Admitting: Emergency Medicine

## 2019-04-10 DIAGNOSIS — F411 Generalized anxiety disorder: Secondary | ICD-10-CM

## 2019-04-10 DIAGNOSIS — F322 Major depressive disorder, single episode, severe without psychotic features: Secondary | ICD-10-CM | POA: Diagnosis not present

## 2019-04-10 DIAGNOSIS — R4182 Altered mental status, unspecified: Secondary | ICD-10-CM | POA: Insufficient documentation

## 2019-04-10 DIAGNOSIS — Z853 Personal history of malignant neoplasm of breast: Secondary | ICD-10-CM | POA: Insufficient documentation

## 2019-04-10 DIAGNOSIS — Z20828 Contact with and (suspected) exposure to other viral communicable diseases: Secondary | ICD-10-CM | POA: Diagnosis not present

## 2019-04-10 DIAGNOSIS — I10 Essential (primary) hypertension: Secondary | ICD-10-CM | POA: Diagnosis not present

## 2019-04-10 DIAGNOSIS — F419 Anxiety disorder, unspecified: Secondary | ICD-10-CM | POA: Diagnosis present

## 2019-04-10 LAB — ACETAMINOPHEN LEVEL: Acetaminophen (Tylenol), Serum: <10 ug/mL — ABNORMAL LOW (ref 10–30)

## 2019-04-10 LAB — COMPREHENSIVE METABOLIC PANEL
ALT: 22 U/L (ref 0–44)
AST: 21 U/L (ref 15–41)
Albumin: 4.4 g/dL (ref 3.5–5.0)
Alkaline Phosphatase: 50 U/L (ref 38–126)
Anion gap: 11 (ref 5–15)
BUN: 16 mg/dL (ref 8–23)
CO2: 23 mmol/L (ref 22–32)
Calcium: 9.8 mg/dL (ref 8.9–10.3)
Chloride: 104 mmol/L (ref 98–111)
Creatinine, Ser: 0.81 mg/dL (ref 0.44–1.00)
GFR calc Af Amer: >60 mL/min (ref >60)
GFR calc non Af Amer: >60 mL/min (ref >60)
Glucose, Bld: 84 mg/dL (ref 70–99)
Potassium: 4 mmol/L (ref 3.5–5.1)
Sodium: 138 mmol/L (ref 135–145)
Total Bilirubin: 1.2 mg/dL (ref 0.3–1.2)
Total Protein: 6.9 g/dL (ref 6.5–8.1)

## 2019-04-10 LAB — URINE DRUG SCREEN, QUALITATIVE (ARMC ONLY)
Amphetamines, Ur Screen: NOT DETECTED
Barbiturates, Ur Screen: NOT DETECTED
Benzodiazepine, Ur Scrn: NOT DETECTED
Cannabinoid 50 Ng, Ur ~~LOC~~: NOT DETECTED
Cocaine Metabolite,Ur ~~LOC~~: NOT DETECTED
MDMA (Ecstasy)Ur Screen: NOT DETECTED
Methadone Scn, Ur: NOT DETECTED
Opiate, Ur Screen: NOT DETECTED
Phencyclidine (PCP) Ur S: NOT DETECTED
Tricyclic, Ur Screen: NOT DETECTED

## 2019-04-10 LAB — CBC
HCT: 43.7 % (ref 36.0–46.0)
Hemoglobin: 14.4 g/dL (ref 12.0–15.0)
MCH: 28.5 pg (ref 26.0–34.0)
MCHC: 33 g/dL (ref 30.0–36.0)
MCV: 86.5 fL (ref 80.0–100.0)
Platelets: 237 10*3/uL (ref 150–400)
RBC: 5.05 MIL/uL (ref 3.87–5.11)
RDW: 13.7 % (ref 11.5–15.5)
WBC: 5.4 10*3/uL (ref 4.0–10.5)
nRBC: 0 % (ref 0.0–0.2)

## 2019-04-10 LAB — URINALYSIS, COMPLETE (UACMP) WITH MICROSCOPIC
Bilirubin Urine: NEGATIVE
Glucose, UA: NEGATIVE mg/dL
Hgb urine dipstick: NEGATIVE
Ketones, ur: 80 mg/dL — AB
Nitrite: NEGATIVE
Protein, ur: NEGATIVE mg/dL
Specific Gravity, Urine: 1.021 (ref 1.005–1.030)
pH: 5 (ref 5.0–8.0)

## 2019-04-10 LAB — SALICYLATE LEVEL: Salicylate Lvl: <7 mg/dL (ref 2.8–30.0)

## 2019-04-10 LAB — ETHANOL: Alcohol, Ethyl (B): <10 mg/dL (ref <10)

## 2019-04-10 MED ORDER — HYDROCHLOROTHIAZIDE 12.5 MG PO CAPS
12.5000 mg | ORAL_CAPSULE | Freq: Every day | ORAL | Status: DC
  Administered 2019-04-11: 10:00:00 12.5 mg via ORAL
  Filled 2019-04-10: qty 1

## 2019-04-10 MED ORDER — RISPERIDONE 1 MG PO TBDP
1.0000 mg | ORAL_TABLET | Freq: Two times a day (BID) | ORAL | Status: DC
  Administered 2019-04-10 – 2019-04-11 (×2): 1 mg via ORAL
  Filled 2019-04-10 (×2): qty 1
  Filled 2019-04-10: qty 2

## 2019-04-10 MED ORDER — LORAZEPAM 0.5 MG PO TABS
0.5000 mg | ORAL_TABLET | Freq: Every day | ORAL | Status: DC | PRN
  Administered 2019-04-10: 0.5 mg via ORAL
  Filled 2019-04-10: qty 1

## 2019-04-10 MED ORDER — LISINOPRIL 20 MG PO TABS
20.0000 mg | ORAL_TABLET | Freq: Every day | ORAL | Status: DC
  Administered 2019-04-11: 20 mg via ORAL
  Filled 2019-04-10: qty 1

## 2019-04-10 MED ORDER — LISINOPRIL-HYDROCHLOROTHIAZIDE 20-12.5 MG PO TABS: 1.0000 | ORAL_TABLET | Freq: Every day | ORAL | Status: DC

## 2019-04-10 MED ORDER — LETROZOLE 2.5 MG PO TABS
2.5000 mg | ORAL_TABLET | Freq: Every day | ORAL | Status: DC
  Administered 2019-04-11: 10:00:00 2.5 mg via ORAL
  Filled 2019-04-10: qty 1

## 2019-04-10 MED ORDER — FLUOXETINE HCL 20 MG PO CAPS
40.0000 mg | ORAL_CAPSULE | Freq: Every day | ORAL | Status: DC
  Administered 2019-04-10 – 2019-04-11 (×2): 40 mg via ORAL
  Filled 2019-04-10 (×3): qty 2

## 2019-04-10 NOTE — Telephone Encounter (Signed)
pt is not taking care of her slef no talking,  not showering

## 2019-04-10 NOTE — ED Notes (Signed)
ua sent to the lab.

## 2019-04-10 NOTE — ED Notes (Signed)
Hourly rounding reveals patient in room. No complaints, stable, in no acute distress. Q15 minute rounds and monitoring via Rover and Officer to continue.   

## 2019-04-10 NOTE — ED Triage Notes (Signed)
Husband [name] #[phone]--OACZYS call him with updates.

## 2019-04-10 NOTE — ED Notes (Signed)
Patient transferred to room 20, will continue to monitor.

## 2019-04-10 NOTE — ED Notes (Signed)
Blanket provided  Plan of care discussed

## 2019-04-10 NOTE — ED Triage Notes (Addendum)
Per fam member, psychiatrist recommended ED visit] as patient has not eaten in 24 hours and anxiety has gone up.  Patient does not want to talk, but she is able to.  She is alert and appears anxious. He also says she seems to have eye infection on the left.

## 2019-04-10 NOTE — Telephone Encounter (Signed)
pt husband called left message that pt was admitted but that they will not tell him what is going on.  he states he has not heard anything.
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
## 2019-04-10 NOTE — ED Notes (Signed)
PT  VOL  PENDING  TTS

## 2019-04-10 NOTE — ED Notes (Signed)
Pt dressed out by this tech. Pt belongings sent home with pt's husband.

## 2019-04-10 NOTE — ED Notes (Signed)
Patient given Sprite at her request.

## 2019-04-10 NOTE — ED Provider Notes (Signed)
[facility] Emergency Department Provider Note       Time seen: ----------------------------------------- 12:16 PM on 04/10/2019 ----------------------------------------- Level V caveat: History/ROS limited by altered mental status  I have reviewed the triage vital signs and the nursing notes.  HISTORY   Chief Complaint Psychiatric Evaluation    HPI Kendra [name] is a 73 y.o. female with a history of anxiety, breast cancer, depression, previous alcohol dependence who presents to the ED for poor p.o. intake and anxiety.  According to the history, psychiatrist recommended she come to the ER because she has not eaten in the last 24 hours.  She does not want to talk but she is able to.  No other information or review of systems available at this time.  Past Medical History:  Diagnosis Date  . Anxiety   . Breast cancer ([place]) 03/2018   right breast  . Complication of anesthesia   . Depression   . Family history of breast cancer 03/24/2018  . Family history of lung cancer 03/24/2018  . History of alcohol dependence ([place]) 2007   no ETOH; resolved since 2007  . History of psychosis 2014   due to sleep disturbance  . Hypertension   . Personal history of radiation therapy   . PONV (postoperative nausea and vomiting)     Patient Active Problem List   Diagnosis Date Noted  . MDD (major depressive disorder), severe ([place]) 01/31/2019  . High serum carbohydrate antigen 19-9 (CA19-9) 12/01/2018  . Adjustment disorder with anxious mood 11/24/2018  . Essential hypertension 11/24/2018  . Screening for lipid disorders 11/24/2018  . Elevated LFTs 10/17/2018  . B12 deficiency 10/17/2018  . Incisional hernia, without obstruction or gangrene 09/12/2018  . Moderate recurrent major depression ([place]) 07/25/2018  . Genetic testing 04/07/2018  . Family history of breast cancer 03/24/2018  . Family history of lung cancer 03/24/2018  . Malignant neoplasm of upper-outer  quadrant of right breast in female, estrogen receptor positive ([place]) 03/17/2018  . Malignant neoplasm of upper-inner quadrant of right breast in female, estrogen receptor positive ([place]) 03/17/2018  . History of alcohol dependence ([place]) 02/07/2018  . History of psychosis 02/07/2018  . History of insomnia 02/07/2018    Past Surgical History:  Procedure Laterality Date  . BREAST LUMPECTOMY WITH RADIOACTIVE SEED AND SENTINEL LYMPH NODE BIOPSY Right 04/27/2018   Procedure: RIGHT BREAST LUMPECTOMY WITH RADIOACTIVE SEED X 2 AND RIGHT SENTINEL LYMPH NODE BIOPSY;  Surgeon: [name], MD;  Location: [facility];  Service: General;  Laterality: Right;  . LAPAROTOMY N/A 02/10/2018   Procedure: EXPLORATORY LAPAROTOMY;  Surgeon: Isabel Caprice, MD;  [facility];  Service: Gynecology;  Laterality: N/A;  . MASS EXCISION  01/2018   abdominal  . OVARIAN CYST REMOVAL    . SALPINGOOPHORECTOMY Bilateral 02/10/2018   Procedure: BILATERAL SALPINGO OOPHORECTOMY; PERITONEAL WASHINGS;  Surgeon: Isabel Caprice, MD;  [facility];  Service: Gynecology;  Laterality: Bilateral;  . TONSILLECTOMY    . TONSILLECTOMY AND ADENOIDECTOMY  1953    Allergies Hydroxyzine and Shrimp [shellfish allergy]  Social History Social History   Tobacco Use  . Smoking status: Never Smoker  . Smokeless tobacco: Never Used  Substance Use Topics  . Alcohol use: Not Currently    Frequency: Never    Comment: alcohol dependence prior to 2007  . Drug use: Never   Review of Systems Unknown, patient with altered mental status, positive for anxiety and poor p.o. intake  All systems negative/normal/unremarkable except  as stated in the HPI  ____________________________________________   PHYSICAL EXAM:  VITAL SIGNS: ED Triage Vitals  Enc Vitals Group     BP 04/10/19 1121 126/75     Pulse Rate 04/10/19 1121 82     Resp 04/10/19 1121 16     Temp 04/10/19 1121 98.2 F (36.8 C)     Temp Source  04/10/19 1121 Oral     SpO2 04/10/19 1121 100 %     Weight 04/10/19 1122 160 lb (72.6 kg)     Height 04/10/19 1122 5\' 6"  (1.676 m)     Head Circumference --      Peak Flow --      Pain Score 04/10/19 1121 0     Pain Loc --      Pain Edu? --      Excl. in [place]? --    Constitutional: Alert but patient does not communicate  Eyes: Normal extraocular movements. Cardiovascular: Normal rate, regular rhythm. No murmurs, rubs, or gallops. Respiratory: Normal respiratory effort without tachypnea nor retractions. Breath sounds are clear and equal bilaterally. No wheezes/rales/rhonchi. Gastrointestinal: Soft and nontender. Normal bowel sounds Musculoskeletal: Nontender with normal range of motion in extremities. No lower extremity tenderness nor edema. Neurologic:  No gross focal neurologic deficits are appreciated.  Skin:  Skin is warm, dry and intact. No rash noted. Psychiatric: Anxious mood and affect ____________________________________________  ED COURSE:  As part of my medical decision making, I reviewed the following data within the [facility] History obtained from family if available, nursing notes, old chart and ekg, as well as notes from prior ED visits. Patient presented for anxiety and poor p.o. intake, we will assess with labs and imaging as indicated at this time.   Procedures  Kendra [name] was evaluated in Emergency Department on 04/10/2019 for the symptoms described in the history of present illness. She was evaluated in the context of the global COVID-19 pandemic, which necessitated consideration that the patient might be at risk for infection with the SARS-CoV-2 virus that causes COVID-19. Institutional protocols and algorithms that pertain to the evaluation of patients at risk for COVID-19 are in a state of rapid change based on information released by regulatory bodies including the CDC and federal and state organizations. These policies and algorithms were  followed during the patient's care in the ED.  ____________________________________________   LABS (pertinent positives/negatives)  Labs Reviewed  ACETAMINOPHEN LEVEL - Abnormal; Notable for the following components:      Result Value   Acetaminophen (Tylenol), Serum <10 (*)    All other components within normal limits  COMPREHENSIVE METABOLIC PANEL  ETHANOL  SALICYLATE LEVEL  CBC  URINE DRUG SCREEN, QUALITATIVE ([facility] ONLY)  URINALYSIS, COMPLETE ([facility]) WITH MICROSCOPIC  ___________________________________________   DIFFERENTIAL DIAGNOSIS   Anxiety, depression, medication noncompliance, occult infection  FINAL ASSESSMENT AND PLAN  Anxiety, anorexia   Plan: The patient had presented for anxiety and poor p.o. intake. Patient's labs did not reveal any acute process, although urinalysis is still pending at this time.    Laurence Aly, MD    Note: This note was generated in part or whole with voice recognition software. Voice recognition is usually quite accurate but there are transcription errors that can and very often do occur. I apologize for any typographical errors that were not detected and corrected.     [name], MD 04/10/19 1447

## 2019-04-10 NOTE — Consult Note (Signed)
[facility] Consult   Reason for Consult:  MDD Referring Physician: Dr. Archie Balboa Patient Identification: Kendra [name] MRN:  [id number] Principal Diagnosis: Altered mental status Diagnosis:  Principal Problem:   Altered mental status Active Problems:   MDD (major depressive disorder), severe (HCC)   GAD (generalized anxiety disorder)  Patient is seen, chart is reviewed.  Collateral from outpatient psychiatrist. Total Time spent with patient: 45 minutes  Subjective: "I am still very confused."  HPI: Kendra [name] is a 73 y.o. female with a history of anxiety, breast cancer, depression, previous alcohol dependence who presents to the ED for poor p.o. intake and anxiety.  According to the history, psychiatrist recommended she come to the ER because she has not eaten in the last 24 hours.  She does not want to talk but she is able to.  No other information or review of systems available at this time.  Patient had telephone visit with outpatient psychiatrist today: Per husband, reports patient is not talking , has difficulty finding words , has not been taking care of self and not eating or drinking - worsening since the last 3 days . Outpatient provider attempted to speak to [facility] - she was able to talk one sentence - to writer this AM - she is trying to talk but unable to express verbally according to husband. Advised to go to nearest ED or call 911 for evaluation and need for possible IP admission.  On evaluation in emergency room, patient is laying in bed looking frightened with eyes wide and pupils dilated.  Patient is able to states she is very anxious and afraid.  She is unable to explain why she is not eating.  She is oriented to person and place and denies suicidal ideation.  Patient is agreeable to a trial of Risperdal.  She states that she took her medication last night.  Collateral from husband is pending TTS call.   Patient last seen in emergency department in  March 2020 with below information obtained.  During that encounter, patient was disoriented and paranoid even of husband.  At time of initial discharge from the emergency room, patient refused to go with her husband, and patient was returned to the emergency department for medication management overnight.  The next day patient was able to leave the emergency department into the care of her husband.  She has been following up with outpatient psychiatrist Dr. Shea Evans: Kendra [name] is a 73 y.o. female patient presented to [facility] ED by her husband.[name] stating that his wife voiced that she needs help.  [name] stated that his wife is out off her medications and needs it to be refill until he sees her provider on March 01, 2019.  The patient was seen face-to-face by this provider; chart reviewed and consulted with Dr. Beather Arbour on 01/31/2019 due to the care of the patient. It was discussed with the provider that the patient does not meet criteria to be admitted to the inpatient unit. The discussion with Dr. Beather Arbour was explained to her ; that the patient husband Kendra [name]) brought [name] to the ED due to her been out of her medication for a few weeks. He stated that he is unable to refill her prescriptions until he sees her provider on March 01, 2019.  Dr. Beather Arbour expressed that she would feel comfortable if Kendra [name] remains overnight under observation status until seen by Dr. Leverne Humbles in the morning.  Dr. Beather Arbour, discussed she is not comfortable  having the patient be discharged tonight with the possibilities of her presenting with some behavior issues at home during the night. On evaluation the patient is alert and oriented x 1-2, calm and cooperative, and mood-congruent with affect.  The patient does not appear to be responding to internal or external stimuli. Neither is the patient presenting with any delusional thinking. The patient is unable to answer questions about weather or not she  is suicidal, homicidal, or having self-harm thoughts. The patient is not presenting with any psychotic or paranoid behaviors. During an encounter with the patient, she was not able to answer questions appropriately.  Collateral was obtained by husband Kendra [name] [phone])  who expresses concerns for patient being out of her medication and actively voicing that she needs help.  During this provider interaction with [name] he stated that his wife was over at [facility] ED in September and she saw Dr. Weber Cooks.  He voiced that Dr. Weber Cooks had prescribed her Wellbutrin 300 mg daily, Ativan 0.5 mg as needed and risperidone 1 mg at nighttime.  Mr. Hopman stated that the medication has been working very well for Kendra [name] until she ran out and has not been able to fill her prescription.  He states, she does not have any refills left on her prescription.  She was brought in today with the intention of him having her prescription refill until he is able to take her to her March 01, 2019 psychiatric appointment.  Plan: The patient is not a safety risk to herself or others, and does not require inpatient admission.  The patient currently requires medication refills such as Wellbutrin 300 mg daily Lorazepam 0.5 mg as needed, and Risperdal 1 mg at bedtime in order to assist the patient with stabilization and treatment.   Past Psychiatric History:  Anxiety Depression History off alcohol dependence ([place]) History of psychosis due to sleep disturbance Patient follows with Dr. Brigitte Pulse, neurology for early Alzheimer dementia.  On reevaluation 01/31/2019, patient is found dressed and wandering in her room.  She has a blank stare and is thought blocking.  When pressed to answer why she is in the hospital, she states "I have come to find the leading physicians in systems, as the [place] is the melting pot of all the smartest physicians."  Patient states that she does have some depression and since she has been in the  hospital has had some suicide thoughts.  She states that she has worry about a lot of things, and is concerned that her husband is frustrated with her and will not be able to take her home or allow her to come home, and she would not have anybody to care for her.  Patient denies any panic attack symptoms.  She states that she stopped her medications approximately a month ago, because "I was confused.  Some say to take as needed other state to take daily but I did not know what that meant."  Patient states that she is hoping to find a way to bring balance back into her life.  She currently has vague suicidal thoughts that are intermittent.  She is denying any homicidal ideation.  She denies any hallucinations, however she reports that she does not watch television and sometimes feels like she "gets signs from it when watching."  Pharmacy completed a medication reconciliation and noted that prescriptions are available at the pharmacy.  Spoke with husband regarding patient, who reports that he has had concerns about patient's "disorganized and  chaotic thoughts."  He notes that she is slow to respond to him and make statements which are nonsensical.  He thought patient had stopped medications due to refills not being available, but then found that patient had been hiding medications.  When he asked why she had stopped taking them, he states she told him that "I was afraid I would run out."  Has been denies that patient has any history of self injury or suicide attempts.  He describes patient has a history of alcoholism but has abstained from alcohol for the past 15 years.  He states that she had completed a 30-day inpatient therapy prior to her sobriety.  She did have a psychiatric hospitalization in 2012 due to anxiety related to her abstaining from alcohol.  18 months ago he moved to [place] (keep residence in both states) and was noted to have an abdominal mass as well as breast cancer for which she has  been treated.  Patient did have a MRI of her brain in October 2019 which was indicative of age-related changes, and did not indicate any cause of acute changes of behavior.  Risk to Self:  No Risk to Others:  Possible Prior Inpatient Therapy:  Yes Prior Outpatient Therapy:  Yes, follows Dr. Shea Evans  Past Medical History:  Past Medical History:  Diagnosis Date  . Anxiety   . Breast cancer ([place]) 03/2018   right breast  . Complication of anesthesia   . Depression   . Family history of breast cancer 03/24/2018  . Family history of lung cancer 03/24/2018  . History of alcohol dependence ([place]) 2007   no ETOH; resolved since 2007  . History of psychosis 2014   due to sleep disturbance  . Hypertension   . Personal history of radiation therapy   . PONV (postoperative nausea and vomiting)     Past Surgical History:  Procedure Laterality Date  . BREAST LUMPECTOMY WITH RADIOACTIVE SEED AND SENTINEL LYMPH NODE BIOPSY Right 04/27/2018   Procedure: RIGHT BREAST LUMPECTOMY WITH RADIOACTIVE SEED X 2 AND RIGHT SENTINEL LYMPH NODE BIOPSY;  Surgeon: [name], MD;  Location: [facility];  Service: General;  Laterality: Right;  . LAPAROTOMY N/A 02/10/2018   Procedure: EXPLORATORY LAPAROTOMY;  Surgeon: Isabel Caprice, MD;  Location: [facility];  Service: Gynecology;  Laterality: N/A;  . MASS EXCISION  01/2018   abdominal  . OVARIAN CYST REMOVAL    . SALPINGOOPHORECTOMY Bilateral 02/10/2018   Procedure: BILATERAL SALPINGO OOPHORECTOMY; PERITONEAL WASHINGS;  Surgeon: Isabel Caprice, MD;  Location: [facility];  Service: Gynecology;  Laterality: Bilateral;  . TONSILLECTOMY    . TONSILLECTOMY AND ADENOIDECTOMY  1953   Family History:  Family History  Problem Relation Age of Onset  . Diabetes Mother   . Breast cancer Mother 62  . Hypertension Mother   . Lung cancer Father        asbestos exposure  . Heart disease Brother 71  . Breast cancer Cousin 93       paternal cousin  . Heart  disease Paternal Grandmother 43  . Heart disease Paternal Grandfather    Family Psychiatric  History:History reviewed. No pertinent family history Social History:  Social History   Substance and Sexual Activity  Alcohol Use Not Currently  . Frequency: Never   Comment: alcohol dependence prior to 2007     Social History   Substance and Sexual Activity  Drug Use Never    Social History   Socioeconomic History  .  Marital status: Married    Spouse name: robert  . Number of children: 0  . Years of education: Not on file  . Highest education level: Some college, no degree  Occupational History  . Occupation: retired Furniture conservator/restorer of education  Social Needs  . Financial resource strain: Not hard at all  . Food insecurity:    Worry: Never true    Inability: Never true  . Transportation needs:    Medical: No    Non-medical: No  Tobacco Use  . Smoking status: Never Smoker  . Smokeless tobacco: Never Used  Substance and Sexual Activity  . Alcohol use: Not Currently    Frequency: Never    Comment: alcohol dependence prior to 2007  . Drug use: Never  . Sexual activity: Yes    Partners: Male    Birth control/protection: Surgical  Lifestyle  . Physical activity:    Days per week: 5 days    Minutes per session: 40 min  . Stress: Very much  Relationships  . Social connections:    Talks on phone: Not on file    Gets together: Not on file    Attends religious service: More than 4 times per year    Active member of club or organization: Yes    Attends meetings of clubs or organizations: 1 to 4 times per year    Relationship status: Married  Other Topics Concern  . Not on file  Social History Narrative  . Not on file   Additional Social History:  Lives alone with husband.  Husband states that his wife does have some difficulty sleeping and awakes frequently through the night with apparent night terrors.  He reports that they do still shared that he has not  been harmed, and that touch appears to be soothing to her.  Patient husband has been having more difficult time recently with patient's increased anxiety.  Allergies:   Allergies  Allergen Reactions  . Hydroxyzine Anaphylaxis    Tongue swollen  . Shrimp [Shellfish Allergy] Other (See Comments)    Food poisoning     Labs:  Results for orders placed or performed during the hospital encounter of 04/10/19 (from the past 48 hour(s))  Comprehensive metabolic panel     Status: None   Collection Time: 04/10/19 11:28 AM  Result Value Ref Range   Sodium 138 135 - 145 mmol/L   Potassium 4.0 3.5 - 5.1 mmol/L   Chloride 104 98 - 111 mmol/L   CO2 23 22 - 32 mmol/L   Glucose, Bld 84 70 - 99 mg/dL   BUN 16 8 - 23 mg/dL   Creatinine, Ser 0.81 0.44 - 1.00 mg/dL   Calcium 9.8 8.9 - 10.3 mg/dL   Total Protein 6.9 6.5 - 8.1 g/dL   Albumin 4.4 3.5 - 5.0 g/dL   AST 21 15 - 41 U/L   ALT 22 0 - 44 U/L   Alkaline Phosphatase 50 38 - 126 U/L   Total Bilirubin 1.2 0.3 - 1.2 mg/dL   GFR calc non Af Amer >60 >60 mL/min   GFR calc Af Amer >60 >60 mL/min   Anion gap 11 5 - 15    Comment: Performed at [facility], [address] [place], [place] [address]  cbc     Status: None   Collection Time: 04/10/19 11:28 AM  Result Value Ref Range   WBC 5.4 4.0 - 10.5 K/uL   RBC 5.05 3.87 - 5.11 MIL/uL  Hemoglobin 14.4 12.0 - 15.0 g/dL   HCT 43.7 36.0 - 46.0 %   MCV 86.5 80.0 - 100.0 fL   MCH 28.5 26.0 - 34.0 pg   MCHC 33.0 30.0 - 36.0 g/dL   RDW 13.7 11.5 - 15.5 %   Platelets 237 150 - 400 K/uL   nRBC 0.0 0.0 - 0.2 %    Comment: Performed at [facility], [address] [place], [place] [address]    No current facility-administered medications for this encounter.    Current Outpatient Medications  Medication Sig Dispense Refill  . cyanocobalamin (,VITAMIN B-12,) 1000 MCG/ML injection Inject 1 mL (1,000 mcg total) into the muscle every 30 (thirty) days. 3 mL 3  . FLUoxetine  (PROZAC) 40 MG capsule Take 1 capsule (40 mg total) by mouth daily. 90 capsule 0  . letrozole (FEMARA) 2.5 MG tablet Take 1 tablet (2.5 mg total) by mouth daily. 90 tablet 3  . lisinopril-hydrochlorothiazide (PRINZIDE,ZESTORETIC) 20-12.5 MG tablet Take 1 tablet by mouth daily. 90 tablet 3  . LORazepam (ATIVAN) 0.5 MG tablet Take 1 tablet (0.5 mg total) by mouth daily as needed for anxiety. 30 tablet 2  . QUEtiapine (SEROQUEL) 50 MG tablet Take 1 tablet (50 mg total) by mouth at bedtime. 30 tablet 0    Musculoskeletal: Strength & Muscle Tone: decreased Gait & Station: normal Patient leans: N/A  Psychiatric Specialty Exam:  Physical Exam  Nursing note and vitals reviewed. Constitutional: She appears well-developed and well-nourished. No distress.  HENT:  Head: Normocephalic and atraumatic.  Eyes: Conjunctivae and EOM are normal.  Neck: Normal range of motion.  Cardiovascular: Normal rate and regular rhythm.  Respiratory: Effort normal. No respiratory distress.  Musculoskeletal: Normal range of motion.  Neurological: She is alert.  Disoriented and thought blocking  Review of Systems  Unable to perform ROS: Mental status change  Musculoskeletal: Negative.   Psychiatric/Behavioral: Positive for depression and memory loss. Negative for hallucinations, substance abuse and suicidal ideas. The patient is nervous/anxious and has insomnia.     Blood pressure 126/75, pulse 82, temperature 98.2 F (36.8 C), temperature source Oral, resp. rate 16, height 5\' 6"  (1.676 m), weight 72.6 kg, SpO2 100 %.Body mass index is 25.82 kg/m.  General Appearance: Fairly Groomed  Eye Contact:  Minimal  Speech:  Blocked and Slow  Volume:  Decreased  Mood:  Depressed and Hopeless  Affect:  Blunt and Depressed  Thought Process:  Disorganized  Orientation:  Other:  She knows her name and year, but unable to provide much more information as to why she is here.  Thought Content:  Unable to verbalized much to  provider  Suicidal Thoughts:  No  Homicidal Thoughts:  No  Memory:  Recent;   Poor  Judgement:  Impaired  Insight:  Lacking  Psychomotor Activity:  Decreased  Concentration:  Concentration: Good  Recall:  Poor  Fund of Knowledge:  Poor  Language:  Fair  Akathisia:  NA  Handed:  Right  AIMS (if indicated):     Assets:  Desire for Improvement Others:  Medication refills  ADL's:  Intact  Cognition:  Impaired,  Mild   Sleep:   Insomnia     Treatment Plan Summary: Medication management  After review of patient's symptoms with husband, suspect that patient has developed major depressive disorder with psychotic features.  Medications: Prozac 40 mg daily in the morning for depression Risperdal-M tab 1 mg twice daily Continue Femara, Zestoretic, and vitamin B12 as per listed home  medications.   Disposition: Recommend psychiatric Inpatient admission when medically cleared. Supportive therapy provided about ongoing stressors.  Recommend placement in inpatient geriatric psychiatric facility.  Lavella Hammock, MD 04/10/2019 11:59 AM

## 2019-04-10 NOTE — ED Notes (Signed)
Patient ate 50% of supper and had beverage. No signs of distress, she is calm and cooperative.

## 2019-04-10 NOTE — Telephone Encounter (Signed)
Spoke to [name] - husband , who reports patient is not talking , has difficulty finding words , has not been taking care of self and not eating or drinking - worsening since the last 3 days . Attempted to speak to [facility] - she was able to talk one sentence - to writer this AM - she is trying to talk but unable to express verbally according to husband. Advised to go to nearest ED or call 911 for evaluation and need for possible IP admission.

## 2019-04-10 NOTE — ED Notes (Signed)
Report to include Situation, Background, Assessment, and Recommendations received from [facility]. Patient alert and oriented, warm and dry, in no acute distress. Patient denies SI, HI, AVH and pain. Patient made aware of Q15 minute rounds and Engineer, drilling presence for their safety. Patient instructed to come to me with needs or concerns.

## 2019-04-10 NOTE — Telephone Encounter (Signed)
Ok thanks for letting me know

## 2019-04-10 NOTE — ED Notes (Signed)
Patient's husband called to check on Patient, He ask if we would call and let him know if she would be admitted or not when decision was made by doctor. Nurse let him know that someone would notify him.

## 2019-04-11 ENCOUNTER — Emergency Department: Payer: Medicare Other

## 2019-04-11 DIAGNOSIS — F322 Major depressive disorder, single episode, severe without psychotic features: Secondary | ICD-10-CM | POA: Diagnosis not present

## 2019-04-11 DIAGNOSIS — F411 Generalized anxiety disorder: Secondary | ICD-10-CM | POA: Diagnosis not present

## 2019-04-11 DIAGNOSIS — R4182 Altered mental status, unspecified: Secondary | ICD-10-CM | POA: Diagnosis not present

## 2019-04-11 LAB — SARS CORONAVIRUS 2 BY RT PCR (HOSPITAL ORDER, PERFORMED IN ~~LOC~~ HOSPITAL LAB): SARS Coronavirus 2: NEGATIVE

## 2019-04-11 MED ORDER — LORAZEPAM 1 MG PO TABS
1.0000 mg | ORAL_TABLET | Freq: Every day | ORAL | Status: DC | PRN
  Administered 2019-04-11: 17:00:00 1 mg via ORAL
  Filled 2019-04-11: qty 1

## 2019-04-11 NOTE — ED Provider Notes (Signed)
-----------------------------------------   7:17 AM on 04/11/2019 -----------------------------------------   Blood pressure 129/61, pulse 85, temperature 97.8 F (36.6 C), temperature source Oral, resp. rate 17, height 5\' 6"  (1.676 m), weight 72.6 kg, SpO2 99 %.  The patient is calm and cooperative at this time.  There have been no acute events since the last update.  Awaiting disposition plan from Behavioral Medicine team.   Arta Silence, MD 04/11/19 [phone]

## 2019-04-11 NOTE — ED Notes (Signed)
[facility]  CALLED  FOR  TRANSPORT

## 2019-04-11 NOTE — ED Notes (Signed)
VOL, PENDING CONSULT

## 2019-04-11 NOTE — BH Assessment (Addendum)
Patient has been accepted to [facility].  Patient assigned to Geriatric Unit. Accepting physician is Dr. Gerrit Halls.  Call report to [phone] (ask for Geriatric RN).  Representative was Janett Billow (Intake Clinician - [phone]).   ER Staff is aware of it: Lattie Haw, ER Secretary  Dr. Jacqualine Code, ER MD  Apolonio Schneiders, Patient's Nurse       Patient's Family/Support System (Husband - Mr. Essie Christine: [phone]) has been updated as well.     Address: [facility] [address] [place], [place] [address]

## 2019-04-11 NOTE — ED Notes (Signed)
Referral information for Psychiatric Hospitalization faxed to;   [facility] [phone]),   . [facility] ([phone]---[phone]---[phone]),  . [facility] [phone], [phone], [phone] or [phone]),   . [facility] [phone] or [phone])  . [facility] [phone]),   . [facility] [phone]),   . [facility] [phone] or [phone]),   . [facility]

## 2019-04-11 NOTE — Consult Note (Signed)
[facility] Psychiatry Consult Follow-Up  Reason for Consult:  MDD Referring Physician: Dr. Archie Balboa Patient Identification: Kendra [name] MRN:  [id number] Principal Diagnosis: Altered mental status Diagnosis:  Principal Problem:   Altered mental status Active Problems:   MDD (major depressive disorder), severe (HCC)   GAD (generalized anxiety disorder)  Patient is seen, chart is reviewed.  Collateral from outpatient psychiatrist. Total Time spent with patient: 35 min.  Subjective: " I do not like the same food my husband eats."    HPI: Kendra [name] is a 73 y.o. female with a history of anxiety, breast cancer, depression, previous alcohol dependence who presents to the ED for poor p.o. intake and anxiety.  According to the history, psychiatrist recommended she come to the ER because she has not eaten in the last 24 hours.  She does not want to talk but she is able to.  No other information or review of systems available at this time.  Patient had telephone visit with outpatient psychiatrist on 04/10/2019: Per husband, reports patient is not talking , has difficulty finding words , has not been taking care of self and not eating or drinking - worsening since the last 3 days . Outpatient provider attempted to speak to Kendra [name] - she was able to talk one sentence - to writer this AM - she is trying to talk but unable to express verbally according to husband. Advised to go to nearest ED or call 911 for evaluation and need for possible IP admission.  On evaluation 04/11/2019, patient has been moved to behavioral holding unit.  She lays in bed with anxious appearance and eyes wide and pupils dilated.  Patient is able to state the correct year and month, but reports the date is June 6.  She is surprised to find that she has missed 3 days.  Patient reports that she feels safe in the hospital.  She states she has eaten some of her dinner and is asking for food.  She acknowledges that she has not been  eating at home, but relates that she does not like the same foods her husband likes.  In the past, patient has expressed paranoid ideation about her husband.  Patient is denying SI, HI and AVH.  However, patient does appear to be internally preoccupied, which is improving with Risperdal.  Treatment plan patient had been last seen in emergency department in March 2020 with below information obtained.  During that encounter, patient was disoriented and paranoid even of husband.  At time of initial discharge from the emergency room, patient refused to go with her husband, and patient was returned to the emergency department for medication management overnight.  The next day patient was able to leave the emergency department into the care of her husband.  She has been following up with outpatient psychiatrist Dr. Shea Evans: Kendra [name] is a 73 y.o. female patient presented to [facility] ED by her husband.Kendra [name] stating that his wife voiced that she needs help.  Mr. Pernice stated that his wife is out off her medications and needs it to be refill until he sees her provider on March 01, 2019.  The patient was seen face-to-face by this provider; chart reviewed and consulted with Dr. Beather Arbour on 01/31/2019 due to the care of the patient. It was discussed with the provider that the patient does not meet criteria to be admitted to the inpatient unit. The discussion with Dr. Beather Arbour was explained to her ; that the patient husband Kendra [name]) brought  Kendra [name] to the ED due to her been out of her medication for a few weeks. He stated that he is unable to refill her prescriptions until he sees her provider on March 01, 2019.  Dr. Beather Arbour expressed that she would feel comfortable if Kendra [name] remains overnight under observation status until seen by Dr. Leverne Humbles in the morning.  Dr. Beather Arbour, discussed she is not comfortable having the patient be discharged tonight with the possibilities of her presenting with some  behavior issues at home during the night. On evaluation the patient is alert and oriented x 1-2, calm and cooperative, and mood-congruent with affect.  The patient does not appear to be responding to internal or external stimuli. Neither is the patient presenting with any delusional thinking. The patient is unable to answer questions about weather or not she is suicidal, homicidal, or having self-harm thoughts. The patient is not presenting with any psychotic or paranoid behaviors. During an encounter with the patient, she was not able to answer questions appropriately.  Collateral was obtained by husband Kendra [name] [phone])  who expresses concerns for patient being out of her medication and actively voicing that she needs help.  During this provider interaction with Kendra [name] he stated that his wife was over at [facility] ED in September and she saw Dr. Weber Cooks.  He voiced that Dr. Weber Cooks had prescribed her Wellbutrin 300 mg daily, Ativan 0.5 mg as needed and risperidone 1 mg at nighttime.  Mr. Norsworthy stated that the medication has been working very well for Kendra [name] until she ran out and has not been able to fill her prescription.  He states, she does not have any refills left on her prescription.  She was brought in today with the intention of him having her prescription refill until he is able to take her to her March 01, 2019 psychiatric appointment.  Plan: The patient is not a safety risk to herself or others, and does not require inpatient admission.  The patient currently requires medication refills such as Wellbutrin 300 mg daily Lorazepam 0.5 mg as needed, and Risperdal 1 mg at bedtime in order to assist the patient with stabilization and treatment.  Past Psychiatric History:  Anxiety Depression History off alcohol dependence ([place]) History of psychosis due to sleep disturbance Patient follows with Dr. Brigitte Pulse, neurology for early Alzheimer dementia.  On reevaluation 01/31/2019,  patient is found dressed and wandering in her room.  She has a blank stare and is thought blocking.  When pressed to answer why she is in the hospital, she states "I have come to find the leading physicians in systems, as the [place] is the melting pot of all the smartest physicians."  Patient states that she does have some depression and since she has been in the hospital has had some suicide thoughts.  She states that she has worry about a lot of things, and is concerned that her husband is frustrated with her and will not be able to take her home or allow her to come home, and she would not have anybody to care for her.  Patient denies any panic attack symptoms.  She states that she stopped her medications approximately a month ago, because "I was confused.  Some say to take as needed other state to take daily but I did not know what that meant."  Patient states that she is hoping to find a way to bring balance back into her life.  She currently has vague  suicidal thoughts that are intermittent.  She is denying any homicidal ideation.  She denies any hallucinations, however she reports that she does not watch television and sometimes feels like she "gets signs from it when watching."  Pharmacy completed a medication reconciliation and noted that prescriptions are available at the pharmacy.  Spoke with husband regarding patient, who reports that he has had concerns about patient's "disorganized and chaotic thoughts."  He notes that she is slow to respond to him and make statements which are nonsensical.  He thought patient had stopped medications due to refills not being available, but then found that patient had been hiding medications.  When he asked why she had stopped taking them, he states she told him that "I was afraid I would run out."  Has been denies that patient has any history of self injury or suicide attempts.  He describes patient has a history of alcoholism but has abstained from alcohol for the  past 15 years.  He states that she had completed a 30-day inpatient therapy prior to her sobriety.  She did have a psychiatric hospitalization in 2012 due to anxiety related to her abstaining from alcohol.  18 months ago he moved to [place] (keep residence in both states) and was noted to have an abdominal mass as well as breast cancer for which she has been treated.  Patient did have a MRI of her brain in October 2019 which was indicative of age-related changes, and did not indicate any cause of acute changes of behavior.  Risk to Self: Suicidal Ideation: No Suicidal Intent: No Is patient at risk for suicide?: No Suicidal Plan?: No Access to Means: No What has been your use of drugs/alcohol within the last 12 months?: denied use How many times?: 0 Other Self Harm Risks: denied Triggers for Past Attempts: None known Intentional Self Injurious Behavior: NoneNo Risk to Others: Homicidal Ideation: No Thoughts of Harm to Others: No Current Homicidal Intent: No Current Homicidal Plan: No Access to Homicidal Means: No Identified Victim: None identified History of harm to others?: No Assessment of Violence: None Noted Does patient have access to weapons?: No Criminal Charges Pending?: No Does patient have a court date: NoPossible Prior Inpatient Therapy: Prior Inpatient Therapy: (Unknown)Yes Prior Outpatient Therapy: Prior Outpatient Therapy: Yes Prior Therapy Dates: Current Prior Therapy Facilty/Provider(s): Dr. Thomasena Edis Reason for Treatment: Unknown Does patient have an ACCT team?: No Does patient have Intensive In-House Services?  : No Does patient have [facility] services? : No Does patient have P4CC services?: NoYes, follows Dr. Shea Evans  Past Medical History:  Past Medical History:  Diagnosis Date  . Anxiety   . Breast cancer ([place]) 03/2018   right breast  . Complication of anesthesia   . Depression   . Family history of breast cancer 03/24/2018  . Family history of lung cancer  03/24/2018  . History of alcohol dependence ([place]) 2007   no ETOH; resolved since 2007  . History of psychosis 2014   due to sleep disturbance  . Hypertension   . Personal history of radiation therapy   . PONV (postoperative nausea and vomiting)     Past Surgical History:  Procedure Laterality Date  . BREAST LUMPECTOMY WITH RADIOACTIVE SEED AND SENTINEL LYMPH NODE BIOPSY Right 04/27/2018   Procedure: RIGHT BREAST LUMPECTOMY WITH RADIOACTIVE SEED X 2 AND RIGHT SENTINEL LYMPH NODE BIOPSY;  Surgeon: [name], MD;  Location: [place];  Service: General;  Laterality: Right;  . LAPAROTOMY N/A 02/10/2018  Procedure: EXPLORATORY LAPAROTOMY;  Surgeon: Isabel Caprice, MD;  Location: [facility];  Service: Gynecology;  Laterality: N/A;  . MASS EXCISION  01/2018   abdominal  . OVARIAN CYST REMOVAL    . SALPINGOOPHORECTOMY Bilateral 02/10/2018   Procedure: BILATERAL SALPINGO OOPHORECTOMY; PERITONEAL WASHINGS;  Surgeon: Isabel Caprice, MD;  Location: [facility];  Service: Gynecology;  Laterality: Bilateral;  . TONSILLECTOMY    . TONSILLECTOMY AND ADENOIDECTOMY  1953   Family History:  Family History  Problem Relation Age of Onset  . Diabetes Mother   . Breast cancer Mother 72  . Hypertension Mother   . Lung cancer Father        asbestos exposure  . Heart disease Brother 97  . Breast cancer Cousin 12       paternal cousin  . Heart disease Paternal Grandmother 34  . Heart disease Paternal Grandfather    Family Psychiatric  History:   History reviewed. No pertinent family history  Social History:  Social History   Substance and Sexual Activity  Alcohol Use Not Currently  . Frequency: Never   Comment: alcohol dependence prior to 2007     Social History   Substance and Sexual Activity  Drug Use Never    Social History   Socioeconomic History  . Marital status: Married    Spouse name: robert  . Number of children: 0  . Years of education: Not on file  .  Highest education level: Some college, no degree  Occupational History  . Occupation: retired Furniture conservator/restorer of education  Social Needs  . Financial resource strain: Not hard at all  . Food insecurity:    Worry: Never true    Inability: Never true  . Transportation needs:    Medical: No    Non-medical: No  Tobacco Use  . Smoking status: Never Smoker  . Smokeless tobacco: Never Used  Substance and Sexual Activity  . Alcohol use: Not Currently    Frequency: Never    Comment: alcohol dependence prior to 2007  . Drug use: Never  . Sexual activity: Yes    Partners: Male    Birth control/protection: Surgical  Lifestyle  . Physical activity:    Days per week: 5 days    Minutes per session: 40 min  . Stress: Very much  Relationships  . Social connections:    Talks on phone: Not on file    Gets together: Not on file    Attends religious service: More than 4 times per year    Active member of club or organization: Yes    Attends meetings of clubs or organizations: 1 to 4 times per year    Relationship status: Married  Other Topics Concern  . Not on file  Social History Narrative  . Not on file   Additional Social History:  Lives alone with husband.  Husband states that his wife does have some difficulty sleeping and awakes frequently through the night with apparent night terrors.  He reports that they do still shared that he has not been harmed, and that touch appears to be soothing to her.  Patient husband has been having more difficult time recently with patient's increased anxiety.  Allergies:   Allergies  Allergen Reactions  . Hydroxyzine Anaphylaxis    Tongue swollen  . Shrimp [Shellfish Allergy] Other (See Comments)    Food poisoning     Labs:  Results for orders placed or performed during the hospital encounter of 04/10/19 (from  the past 48 hour(s))  Comprehensive metabolic panel     Status: None   Collection Time: 04/10/19 11:28 AM  Result Value  Ref Range   Sodium 138 135 - 145 mmol/L   Potassium 4.0 3.5 - 5.1 mmol/L   Chloride 104 98 - 111 mmol/L   CO2 23 22 - 32 mmol/L   Glucose, Bld 84 70 - 99 mg/dL   BUN 16 8 - 23 mg/dL   Creatinine, Ser 0.81 0.44 - 1.00 mg/dL   Calcium 9.8 8.9 - 10.3 mg/dL   Total Protein 6.9 6.5 - 8.1 g/dL   Albumin 4.4 3.5 - 5.0 g/dL   AST 21 15 - 41 U/L   ALT 22 0 - 44 U/L   Alkaline Phosphatase 50 38 - 126 U/L   Total Bilirubin 1.2 0.3 - 1.2 mg/dL   GFR calc non Af Amer >60 >60 mL/min   GFR calc Af Amer >60 >60 mL/min   Anion gap 11 5 - 15    Comment: Performed at [facility], [address] [place], [place] [address]  Ethanol     Status: None   Collection Time: 04/10/19 11:28 AM  Result Value Ref Range   Alcohol, Ethyl (B) <10 <10 mg/dL    Comment: (NOTE) Lowest detectable limit for serum alcohol is 10 mg/dL. For medical purposes only. Performed at [facility], [address] [place], [place] [address]   Salicylate level     Status: None   Collection Time: 04/10/19 11:28 AM  Result Value Ref Range   Salicylate Lvl <0.0 2.8 - 30.0 mg/dL    Comment: Performed at [facility], [address] [place], [place] [address]  Acetaminophen level     Status: Abnormal   Collection Time: 04/10/19 11:28 AM  Result Value Ref Range   Acetaminophen (Tylenol), Serum <10 (L) 10 - 30 ug/mL    Comment: (NOTE) Therapeutic concentrations vary significantly. A range of 10-30 ug/mL  may be an effective concentration for many patients. However, some  are best treated at concentrations outside of this range. Acetaminophen concentrations >150 ug/mL at 4 hours after ingestion  and >50 ug/mL at 12 hours after ingestion are often associated with  toxic reactions. Performed at [facility], [address] [place], [place] [address]   cbc     Status: None   Collection Time: 04/10/19 11:28 AM  Result Value Ref Range   WBC 5.4 4.0 - 10.5 K/uL   RBC 5.05 3.87 - 5.11  MIL/uL   Hemoglobin 14.4 12.0 - 15.0 g/dL   HCT 43.7 36.0 - 46.0 %   MCV 86.5 80.0 - 100.0 fL   MCH 28.5 26.0 - 34.0 pg   MCHC 33.0 30.0 - 36.0 g/dL   RDW 13.7 11.5 - 15.5 %   Platelets 237 150 - 400 K/uL   nRBC 0.0 0.0 - 0.2 %    Comment: Performed at [facility], [address], [place], [place] [address]  Urine Drug Screen, Qualitative     Status: None   Collection Time: 04/10/19 11:28 AM  Result Value Ref Range   Tricyclic, Ur Screen NONE DETECTED NONE DETECTED   Amphetamines, Ur Screen NONE DETECTED NONE DETECTED   MDMA (Ecstasy)Ur Screen NONE DETECTED NONE DETECTED   Cocaine Metabolite,Ur [place] NONE DETECTED NONE DETECTED   Opiate, Ur Screen NONE DETECTED NONE DETECTED   Phencyclidine (PCP) Ur S NONE DETECTED NONE DETECTED   Cannabinoid 50 Ng, Ur [place]ra [place] NONE  DETECTED NONE DETECTED   Barbiturates, Ur Screen NONE DETECTED NONE DETECTED   Benzodiazepine, Ur Scrn NONE DETECTED NONE DETECTED   Methadone Scn, Ur NONE DETECTED NONE DETECTED    Comment: (NOTE) Tricyclics + metabolites, urine    Cutoff 1000 ng/mL Amphetamines + metabolites, urine  Cutoff 1000 ng/mL MDMA (Ecstasy), urine              Cutoff 500 ng/mL Cocaine Metabolite, urine          Cutoff 300 ng/mL Opiate + metabolites, urine        Cutoff 300 ng/mL Phencyclidine (PCP), urine         Cutoff 25 ng/mL Cannabinoid, urine                 Cutoff 50 ng/mL Barbiturates + metabolites, urine  Cutoff 200 ng/mL Benzodiazepine, urine              Cutoff 200 ng/mL Methadone, urine                   Cutoff 300 ng/mL The urine drug screen provides only a preliminary, unconfirmed analytical test result and should not be used for non-medical purposes. Clinical consideration and professional judgment should be applied to any positive drug screen result due to possible interfering substances. A more specific alternate chemical method must be used in order to obtain a confirmed analytical result. Gas chromatography / mass  spectrometry (GC/MS) is the preferred confirmat ory method. Performed at [facility], [address] [place], [place] [address]   Urinalysis, Complete w Microscopic     Status: Abnormal   Collection Time: 04/10/19 12:14 PM  Result Value Ref Range   Color, Urine YELLOW (A) YELLOW   APPearance HAZY (A) CLEAR   Specific Gravity, Urine 1.021 1.005 - 1.030   pH 5.0 5.0 - 8.0   Glucose, UA NEGATIVE NEGATIVE mg/dL   Hgb urine dipstick NEGATIVE NEGATIVE   Bilirubin Urine NEGATIVE NEGATIVE   Ketones, ur 80 (A) NEGATIVE mg/dL   Protein, ur NEGATIVE NEGATIVE mg/dL   Nitrite NEGATIVE NEGATIVE   Leukocytes,Ua TRACE (A) NEGATIVE   RBC / HPF 0-5 0 - 5 RBC/hpf   WBC, UA 6-10 0 - 5 WBC/hpf   Bacteria, UA RARE (A) NONE SEEN   Squamous Epithelial / LPF 0-5 0 - 5   Mucus PRESENT    Hyaline Casts, UA PRESENT     Comment: Performed at [facility], [address], [place], [place] [address]    Current Facility-Administered Medications  Medication Dose Route Frequency Provider Last Rate Last Dose  . FLUoxetine (PROZAC) capsule 40 mg  40 mg Oral Daily Lavella Hammock, MD   40 mg at 04/11/19 1019  . hydrochlorothiazide (MICROZIDE) capsule 12.5 mg  12.5 mg Oral Daily [name], MD   12.5 mg at 04/11/19 1019  . letrozole [facility]) tablet 2.5 mg  2.5 mg Oral Daily Lavella Hammock, MD   2.5 mg at 04/11/19 1019  . lisinopril (ZESTRIL) tablet 20 mg  20 mg Oral Daily [name], MD   20 mg at 04/11/19 1019  . LORazepam (ATIVAN) tablet 0.5 mg  0.5 mg Oral Daily PRN Lavella Hammock, MD   0.5 mg at 04/10/19 1218  . risperiDONE (RISPERDAL M-TABS) disintegrating tablet 1 mg  1 mg Oral BID Lavella Hammock, MD   1 mg at 04/11/19 1019   Current Outpatient Medications  Medication Sig Dispense Refill  . FLUoxetine (  PROZAC) 40 MG capsule Take 1 capsule (40 mg total) by mouth daily. 90 capsule 0  . letrozole (FEMARA) 2.5 MG tablet Take 1 tablet (2.5 mg total) by mouth  daily. 90 tablet 3  . lisinopril-hydrochlorothiazide (PRINZIDE,ZESTORETIC) 20-12.5 MG tablet Take 1 tablet by mouth daily. 90 tablet 3  . LORazepam (ATIVAN) 0.5 MG tablet Take 1 tablet (0.5 mg total) by mouth daily as needed for anxiety. 30 tablet 2  . QUEtiapine (SEROQUEL) 50 MG tablet Take 1 tablet (50 mg total) by mouth at bedtime. 30 tablet 0  . cyanocobalamin (,VITAMIN B-12,) 1000 MCG/ML injection Inject 1 mL (1,000 mcg total) into the muscle every 30 (thirty) days. (Patient not taking: Reported on 04/10/2019) 3 mL 3  . risperiDONE (RISPERDAL) 3 MG tablet Take 1 tablet by mouth daily.      Musculoskeletal: Strength & Muscle Tone: decreased Gait & Station: normal Patient leans: N/A  Psychiatric Specialty Exam:  Physical Exam  Nursing note and vitals reviewed. Constitutional: She appears well-developed and well-nourished. No distress.  HENT:  Head: Normocephalic and atraumatic.  Eyes: Conjunctivae and EOM are normal.  Neck: Normal range of motion.  Cardiovascular: Normal rate and regular rhythm.  Respiratory: Effort normal. No respiratory distress.  Musculoskeletal: Normal range of motion.  Neurological: She is alert.  Disoriented and thought blocking  Review of Systems  Unable to perform ROS: Mental status change  Musculoskeletal: Negative.   Psychiatric/Behavioral: Positive for depression and memory loss. Negative for hallucinations, substance abuse and suicidal ideas. The patient is nervous/anxious and has insomnia.     Blood pressure (!) 144/78, pulse 70, temperature 98.2 F (36.8 C), temperature source Oral, resp. rate 14, height 5\' 6"  (1.676 m), weight 72.6 kg, SpO2 99 %.Body mass index is 25.82 kg/m.  General Appearance: Fairly Groomed  Eye Contact:  Minimal  Speech:  Blocked and Slow  Volume:  Decreased  Mood:  Depressed and Hopeless  Affect:  Blunt and Depressed  Thought Process:  Disorganized  Orientation:  Other:  She knows her name and year, but unable to  provide much more information as to why she is here.  Thought Content:  Unable to verbalized much to provider  Suicidal Thoughts:  No  Homicidal Thoughts:  No  Memory:  Recent;   Poor  Judgement:  Impaired  Insight:  Lacking  Psychomotor Activity:  Decreased  Concentration:  Concentration: Good  Recall:  Poor  Fund of Knowledge:  Poor  Language:  Fair  Akathisia:  NA  Handed:  Right  AIMS (if indicated):     Assets:  Desire for Improvement Others:  Medication refills  ADL's:  Intact  Cognition:  Impaired,  Mild   Sleep:   Insomnia, adequate overnight    Treatment Plan Summary: Medication management  After review of patient's symptoms with husband, suspect that patient has developed major depressive disorder with psychotic features.  Medications: Prozac 40 mg daily in the morning for depression Risperdal-M tab 1 mg twice daily Continue Femara, Zestoretic, and vitamin B12 as per listed home medications.  Disposition: Recommend psychiatric Inpatient admission when medically cleared. Supportive therapy provided about ongoing stressors.  Recommend placement in inpatient geriatric psychiatric facility. She has been accepted at [facility].  Lavella Hammock, MD 04/11/2019 1:19 PM

## 2019-04-11 NOTE — ED Notes (Signed)
Pt ambulated to XR and back with this tech. Pt steady on feet. Offered pt something to eat and pt declined. Will try again.

## 2019-04-11 NOTE — ED Notes (Signed)
Pt is up at the bedside eating lunch.

## 2019-04-11 NOTE — BH Assessment (Signed)
Referral information for Psychiatric Hospitalization faxed to;   [facility] [phone]),    [facility] [phone] -[phone]) [phone]   [facility] [phone]),   [place] [phone], [phone], [phone] or [phone]),    [facility] [phone]),    [place] [phone]),    [place] [phone])   [place] [phone]),    [address] [phone]),    [facility] [phone] or [phone])   [facility] [phone] or [phone]),    [facility] [phone])

## 2019-04-11 NOTE — ED Notes (Signed)
Pt given lunch tray.

## 2019-04-11 NOTE — ED Notes (Signed)
[facility] called to determine if pt was appropriate for placement.

## 2019-04-11 NOTE — ED Notes (Signed)
All  Paperwork  Given to  [employer]

## 2019-04-11 NOTE — ED Notes (Signed)
Referral information for Psychiatric Hospitalization faxed to;   [facility] [phone]),   . [facility] [phone]),  . [facility] [phone], [phone], [phone] or [phone]),   . [place] [phone] or [phone])  . [facility] [phone]),   . [place] [phone]),   . [place] [phone] or [phone]),

## 2019-04-12 LAB — NOVEL CORONAVIRUS, NAA (HOSP ORDER, SEND-OUT TO REF LAB; TAT 18-24 HRS): SARS-CoV-2, NAA: NOT DETECTED

## 2019-04-13 LAB — LIPID PANEL
Cholesterol: 173 (ref 0–200)
HDL: 62 (ref 35–70)
LDL Cholesterol: 95
Triglycerides: 78 (ref 40–160)

## 2019-04-13 LAB — CBC AND DIFFERENTIAL
HCT: 42 (ref 36–46)
Hemoglobin: 14.1 (ref 12.0–16.0)
Platelets: 222 (ref 150–399)
WBC: 4

## 2019-04-13 LAB — BASIC METABOLIC PANEL
BUN: 13 (ref 4–21)
Creatinine: 0.9 (ref <=1.1)
Glucose: 120
Potassium: 3.9 (ref 3.4–5.3)
Sodium: 137 (ref 137–147)

## 2019-04-13 LAB — TSH: TSH: 2.31 (ref <=5.90)

## 2019-04-13 LAB — HEPATIC FUNCTION PANEL
ALT: 19 (ref 7–35)
AST: 18 (ref 13–35)
Alkaline Phosphatase: 56 (ref 25–125)
Bilirubin, Total: 0.7

## 2019-04-13 LAB — VITAMIN D 25 HYDROXY (VIT D DEFICIENCY, FRACTURES): Vit D, 25-Hydroxy: 26.2

## 2019-04-13 LAB — HEMOGLOBIN A1C: Hemoglobin A1C: 5.4

## 2019-04-14 ENCOUNTER — Telehealth: Payer: Self-pay

## 2019-04-14 NOTE — Telephone Encounter (Signed)
Returned call to [name] - husband . He is worried about his wife being hospitalized eventhough he understands she is getting the help. He also asked about EKG done in the ED -  Provided reassurance , discussed EKG report.

## 2019-04-14 NOTE — Telephone Encounter (Signed)
Patient's husband called and stated patient has been moved to the [facility] facility in [place], [place]. He also stated that an EKG was done with the patient on 04/12/19. He asked that the doctor take a look at it and give him a call to go over it with him, if possible. Thank you.

## 2019-04-18 ENCOUNTER — Other Ambulatory Visit: Payer: Medicare Other

## 2019-04-18 ENCOUNTER — Ambulatory Visit: Payer: Medicare Other | Admitting: Hematology and Oncology

## 2019-04-19 ENCOUNTER — Ambulatory Visit: Payer: Medicare Other | Admitting: Psychiatry

## 2019-04-19 ENCOUNTER — Other Ambulatory Visit: Payer: Self-pay

## 2019-05-02 ENCOUNTER — Telehealth: Payer: Self-pay | Admitting: *Deleted

## 2019-05-02 NOTE — Telephone Encounter (Signed)
[facility] called to let Dr. B know that they will start home health 05/09/2019.

## 2019-05-03 ENCOUNTER — Other Ambulatory Visit: Payer: Self-pay

## 2019-05-03 ENCOUNTER — Ambulatory Visit (INDEPENDENT_AMBULATORY_CARE_PROVIDER_SITE_OTHER): Payer: Medicare Other | Admitting: Licensed Clinical Social Worker

## 2019-05-03 ENCOUNTER — Encounter: Payer: Self-pay | Admitting: Licensed Clinical Social Worker

## 2019-05-03 DIAGNOSIS — F411 Generalized anxiety disorder: Secondary | ICD-10-CM

## 2019-05-03 DIAGNOSIS — F319 Bipolar disorder, unspecified: Secondary | ICD-10-CM | POA: Diagnosis not present

## 2019-05-03 NOTE — Progress Notes (Signed)
Virtual Visit via Video Note  I connected with Kendra [name] on 05/03/19 at 11:00 AM EDT by a video enabled telemedicine application and verified that I am speaking with the correct person using two identifiers.   I discussed the limitations of evaluation and management by telemedicine and the availability of in person appointments. The patient expressed understanding and agreed to proceed.  I discussed the assessment and treatment plan with the patient. The patient was provided an opportunity to ask questions and all were answered. The patient agreed with the plan and demonstrated an understanding of the instructions.   The patient was advised to call back or seek an in-person evaluation if the symptoms worsen or if the condition fails to improve as anticipated.  I provided 30 minutes of non-face-to-face time during this encounter.   Alden Hipp, LCSW    THERAPIST PROGRESS NOTE  Session Time: 1100 Participation Level: Active  Behavioral Response: CasualConfusedAnxious  Type of Therapy: Individual Therapy  Treatment Goals addressed: Anxiety  Interventions: CBT  Summary: Kendra [name] is a 73 y.o. female who presents with continued symptoms related to her diagnosis. Aslin was joined by her husband, Kendra [name], for today's session. Kendra [name] reported things have gone well since our last session, and Kendra [name] was taken to the ER shortly after our last session and then admitted to a behavioral health hospital. At the hospital, Kendra [name]'s medications were changed and she has since been able to articulate herself more clearly. Kendra [name] reported, "Kendra [name], I caused COVID-19 and I'm just waiting to know what to do next." LCSW validated that statement and normalized associated feelings of anxiety and stress. Kendra [name] was not able to report why she felt she caused COVID-19 but was adamant she caused it. She reported increased anxiety in various situations, but was unable to discuss specific situations when she  feels more anxious aside from "talking to you." LCSW encouraged Kendra [name] to begin paying more attention to when she feels anxious, so we can begin to challenge negative thoughts. However, at this time, since she is unable to report specifics, we will focus on treating the symptoms. LCSW encouraged Kendra [name] to practice distraction techniques (looking around the room to find things that start with a certain letter or are a certain color), when feeling anxious. Kendra [name] expressed understanding and agreement. Kendra [name] also expressed feeling worried when she leaves the house. She expressed a feeling of not being where she is supposed to be, though she was not able to provide further information. We discussed how we could begin challenging those thoughts, but decided to utilize a mantra to remind Kendra [name] she is exactly where she is supposed to be, to see if self talk can alleviate some of her worry. Kendra [name] and her husband both expressed understanding and agreement with this idea.    Suicidal/Homicidal: No   Therapist Response: Kendra [name] continues to work towards her tx goals but has not yet reached them. We will continue to work on emotional regulation skills and challenging anxious thoughts.   Plan: Return again in 4 weeks.  Diagnosis: Axis I: bipolar disorder    Axis II: No diagnosis    Alden Hipp, LCSW 05/03/2019

## 2019-05-25 ENCOUNTER — Encounter: Payer: Self-pay | Admitting: Family Medicine

## 2019-05-26 ENCOUNTER — Inpatient Hospital Stay: Payer: Medicare Other | Admitting: Family Medicine

## 2019-05-27 DIAGNOSIS — R978 Other abnormal tumor markers: Secondary | ICD-10-CM | POA: Insufficient documentation

## 2019-05-30 NOTE — Progress Notes (Signed)
[facility]  [address], [address] [place], [place] [address] Phone: [phone]  Fax: [phone]   Clinic Day:  06/01/2019  Referring physician: Virginia Crews, MD  Chief Complaint: Kendra [name] is a 73 y.o. female with multifocal stage IA right breast cancer and B12 deficiency who is seen for 3 month assessment.   HPI: The patient was last seen in the medical oncology clinic on 03/02/2019. At that time, she denied any breast concerns. She had occasional hot flashes. Quality of life was good. She continued Letrozole.   The patient was seen for reevaluation with Dr. Fredirick Maudlin on 03/15/2019. The patient was not interested in surgical intervention for her multiple ventral hernias.   CXR on 04/11/2019 revealed no edema or consolidation. No evident adenopathy.   During the interim, the patient is doing good. She notes she went to behavioral camp in [place], [place] for x 17 days. She found the camp to be very helpful.   She notes constant hot flashes and night sweats. She notes constipation, shortness of breath on exertion. Her balance has improved, and she walking for 25 minutes daily. She has edema in her right leg.  She is still getting her B-12 injections at home.  She denies any breast concerns. She examines her breast monthly. She is taking Femara.  Her husband notes she is doing well.  He notes she has a good appetite. She denies any alcohol use. Patients PCP is Dr. Lavon Paganini.    Past Medical History:  Diagnosis Date  . Anxiety   . Breast cancer ([place]) 03/2018   right breast  . Complication of anesthesia   . Depression   . Family history of breast cancer 03/24/2018  . Family history of lung cancer 03/24/2018  . History of alcohol dependence ([place]) 2007   no ETOH; resolved since 2007  . History of psychosis 2014   due to sleep disturbance  . Hypertension   . Personal history of radiation therapy   . PONV (postoperative nausea and  vomiting)     Past Surgical History:  Procedure Laterality Date  . BREAST LUMPECTOMY WITH RADIOACTIVE SEED AND SENTINEL LYMPH NODE BIOPSY Right 04/27/2018   Procedure: RIGHT BREAST LUMPECTOMY WITH RADIOACTIVE SEED X 2 AND RIGHT SENTINEL LYMPH NODE BIOPSY;  Surgeon: [name], MD;  Location: [facility];  Service: General;  Laterality: Right;  . LAPAROTOMY N/A 02/10/2018   Procedure: EXPLORATORY LAPAROTOMY;  Surgeon: Isabel Caprice, MD;  Location: [facility];  Service: Gynecology;  Laterality: N/A;  . MASS EXCISION  01/2018   abdominal  . OVARIAN CYST REMOVAL    . SALPINGOOPHORECTOMY Bilateral 02/10/2018   Procedure: BILATERAL SALPINGO OOPHORECTOMY; PERITONEAL WASHINGS;  Surgeon: Isabel Caprice, MD;  Location: [facility];  Service: Gynecology;  Laterality: Bilateral;  . TONSILLECTOMY    . TONSILLECTOMY AND ADENOIDECTOMY  1953    Family History  Problem Relation Age of Onset  . Diabetes Mother   . Breast cancer Mother 74  . Hypertension Mother   . Lung cancer Father        asbestos exposure  . Heart disease Brother 20  . Breast cancer Cousin 72       paternal cousin  . Heart disease Paternal Grandmother 4  . Heart disease Paternal Grandfather     Social History:  reports that she has never smoked. She has never used smokeless tobacco. She reports previous alcohol use. She reports that she does not use drugs. She  is married to her husband [name]. The patient is accompanied by her husband via phone today.  She lives in [place].  Allergies:  Allergies  Allergen Reactions  . Hydroxyzine Anaphylaxis    Tongue swollen  . Shrimp [Shellfish Allergy] Other (See Comments)    Food poisoning     Current Medications: Current Outpatient Medications  Medication Sig Dispense Refill  . cyanocobalamin (,VITAMIN B-12,) 1000 MCG/ML injection Inject 1 mL (1,000 mcg total) into the muscle every 30 (thirty) days. 3 mL 3  . FLUoxetine HCl 60 MG TABS Take 60 mg by mouth daily.  90 tablet 0  . letrozole (FEMARA) 2.5 MG tablet Take 1 tablet (2.5 mg total) by mouth daily. 90 tablet 3  . lisinopril-hydrochlorothiazide (PRINZIDE,ZESTORETIC) 20-12.5 MG tablet Take 1 tablet by mouth daily. 90 tablet 3  . risperiDONE (RISPERDAL) 3 MG tablet Take 0.5 tablets (1.5 mg total) by mouth 2 (two) times daily. 90 tablet 0  . Vitamin D, Ergocalciferol, (DRISDOL) 1.25 MG (50000 UT) CAPS capsule      No current facility-administered medications for this visit.     Review of Systems  Constitutional: Negative for chills, diaphoresis, fever, malaise/fatigue and weight loss (up 12 lbs).       Doing well.  HENT: Negative.  Negative for congestion, hearing loss, nosebleeds, sinus pain and sore throat.   Eyes: Negative.  Negative for blurred vision, double vision, photophobia and redness.  Respiratory: Positive for shortness of breath (exertional). Negative for cough, sputum production and wheezing.   Cardiovascular: Negative.  Negative for chest pain, palpitations, leg swelling and PND.  Gastrointestinal: Positive for constipation. Negative for abdominal pain, blood in stool, diarrhea, nausea and vomiting.       Eating well.  Genitourinary: Negative.  Negative for dysuria, frequency, hematuria and urgency.  Musculoskeletal: Negative for back pain, myalgias and neck pain.       Occasional heaviness in right breast. Denies lumps or nipple discharge.   Skin: Negative.  Negative for itching and rash.  Neurological: Negative for dizziness, sensory change, weakness and headaches.       Balance "improved".  Endo/Heme/Allergies: Does not bruise/bleed easily.       Vasomotor symptoms.   Psychiatric/Behavioral: Positive for memory loss. The patient is not nervous/anxious.   All other systems reviewed and are negative.  Performance status (ECOG): 1  Vitals Blood pressure (!) 115/50, pulse 63, temperature (!) 97.2 F (36.2 C), temperature source Tympanic, resp. rate 18, height _0  (1.676 m),  weight 174 lb 0.9 oz (78.9 kg), SpO2 100 %.   Physical Exam  Constitutional: She is oriented to person, place, and time. She appears well-developed and well-nourished. No distress.  HENT:  Head: Normocephalic and atraumatic.  Mouth/Throat: Oropharynx is clear and moist. No oropharyngeal exudate.  Shoulder length blonde hair.  Eyes: Pupils are equal, round, and reactive to light. Conjunctivae and EOM are normal. No scleral icterus.  Blue eyes.  Neck: Normal range of motion. Neck supple. No JVD present.  Cardiovascular: Normal rate, regular rhythm and normal heart sounds.  No murmur heard. Pulmonary/Chest: Effort normal and breath sounds normal. No respiratory distress. She has no wheezes. She has no rales. She exhibits edema (right breast, inferior quadrant). She exhibits no tenderness. Right breast exhibits no tenderness. Left breast exhibits no skin change (fibrocystic changes in the outer quadrant.).  Abdominal: Soft. Bowel sounds are normal. She exhibits no mass. There is no abdominal tenderness. There is no rebound and no guarding.  Hernia left of  midline.  Musculoskeletal: Normal range of motion.        General: No tenderness or edema.  Lymphadenopathy:    She has no cervical adenopathy.    She has no axillary adenopathy.       Right: No supraclavicular adenopathy present.       Left: No supraclavicular adenopathy present.  Neurological: She is alert and oriented to person, place, and time.  Skin: Skin is warm and dry. No rash noted. She is not diaphoretic. No erythema. No pallor.  Psychiatric: She has a normal mood and affect. Her behavior is normal. Judgment and thought content normal.  Nursing note and vitals reviewed.   Appointment on 06/01/2019  Component Date Value Ref Range Status  . Sodium 06/01/2019 131* 135 - 145 mmol/L Final  . Potassium 06/01/2019 3.9  3.5 - 5.1 mmol/L Final  . Chloride 06/01/2019 98  98 - 111 mmol/L Final  . CO2 06/01/2019 25  22 - 32 mmol/L Final   . Glucose, Bld 06/01/2019 116* 70 - 99 mg/dL Final  . BUN 06/01/2019 11  8 - 23 mg/dL Final  . Creatinine, Ser 06/01/2019 0.72  0.44 - 1.00 mg/dL Final  . Calcium 06/01/2019 9.2  8.9 - 10.3 mg/dL Final  . Total Protein 06/01/2019 6.4* 6.5 - 8.1 g/dL Final  . Albumin 06/01/2019 3.7  3.5 - 5.0 g/dL Final  . AST 06/01/2019 37  15 - 41 U/L Final  . ALT 06/01/2019 105* 0 - 44 U/L Final  . Alkaline Phosphatase 06/01/2019 68  38 - 126 U/L Final  . Total Bilirubin 06/01/2019 0.5  0.3 - 1.2 mg/dL Final  . GFR calc non Af Amer 06/01/2019 >60  >60 mL/min Final  . GFR calc Af Amer 06/01/2019 >60  >60 mL/min Final  . Anion gap 06/01/2019 8  5 - 15 Final   Performed at [facility], [address], [place], [place] [address]  . WBC 06/01/2019 4.6  4.0 - 10.5 K/uL Final  . RBC 06/01/2019 3.98  3.87 - 5.11 MIL/uL Final  . Hemoglobin 06/01/2019 11.6* 12.0 - 15.0 g/dL Final  . HCT 06/01/2019 35.6* 36.0 - 46.0 % Final  . MCV 06/01/2019 89.4  80.0 - 100.0 fL Final  . MCH 06/01/2019 29.1  26.0 - 34.0 pg Final  . MCHC 06/01/2019 32.6  30.0 - 36.0 g/dL Final  . RDW 06/01/2019 15.1  11.5 - 15.5 % Final  . Platelets 06/01/2019 212  150 - 400 K/uL Final  . nRBC 06/01/2019 0.0  0.0 - 0.2 % Final  . Neutrophils Relative % 06/01/2019 62  % Final  . Neutro Abs 06/01/2019 2.8  1.7 - 7.7 K/uL Final  . Lymphocytes Relative 06/01/2019 24  % Final  . Lymphs Abs 06/01/2019 1.1  0.7 - 4.0 K/uL Final  . Monocytes Relative 06/01/2019 10  % Final  . Monocytes Absolute 06/01/2019 0.5  0.1 - 1.0 K/uL Final  . Eosinophils Relative 06/01/2019 3  % Final  . Eosinophils Absolute 06/01/2019 0.2  0.0 - 0.5 K/uL Final  . Basophils Relative 06/01/2019 1  % Final  . Basophils Absolute 06/01/2019 0.0  0.0 - 0.1 K/uL Final  . Immature Granulocytes 06/01/2019 0  % Final  . Abs Immature Granulocytes 06/01/2019 0.02  0.00 - 0.07 K/uL Final   Performed at [facility], [address] [place], [place]  [address]    Assessment:  Kendra [name] is a 73 y.o. female  with multi-focal stage  IA right breast cancers/p right lumpectomy on 04/27/2018. Pathology of the right lateral lesionrevealed a 2.5 cm grade II invasive ductal carcinoma with intermediate grade DCIS. The medial lesionrevealed a 1.1 cm grade II invasive ductal carcinoma with intermediate grade DCIS. LVIDS was present. Margins were negative. Zero of 2 lymph nodes were positive. Both tumors were ER + (100%), PR + (70%), and HER-2 negative. Ki-67 was 10%. Pathologic stage was mT2N0.  Oncotype DX revealed a recurrence score of 12 which translated to a distant recurrence of 3% at 9 years with an AI or tamxifen alone. The absolute benefit of chemotherpay was <1%.  Invitae testingon 03/23/2018 revealed a variant of uncertain significance in MSH6.   She received breast radiationfrom 06/30/2018 - 07/20/2018. She began [facility] 09/06/2018. She is tolerating it well.  CA27.29 has been followed: 25.4 on 03/01/2019 and 23.5 on 06/01/2019.  She underwent exploratory laparotomy, bilateral salpingo-oophrectomy, and pelvic washings on 02/10/2018. 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 Peritoneal washings revealed atypical cells.   She has had elevated tumor markers.CA125has been followed (0-38.1):48.2on 03/27/2019and11.3 on 10/24/2018.CA19-9has been followed (0-35):322 on 01/26/2018, 48 on 10/24/2018, and 45 on 03/01/2019.  Abdomen and pelvic CTon 02/04/2018 revealed a large (28.5 x 17 x 25 cm) cystic mass with enhancing septations and nodularity involving the majority of the abdomen. Abdomen and pelvic CTon 10/14/2018 revealed no acute findings. There was interval surgical resection of large cystic pelvic mass. There was no evidence of residual or metastatic neoplasm. There was suspected hepatic cirrhosis. There were three midline ventral abdominal wall hernias (small epigastric ventral  hernia, moderate paraumbilical hernia, and small suprapubic ventral hernia).   She has a history ofelevated LFTs. AST was 50 on 10/06/2018. Hepatitis B and C serologies were negative.  Bone densityon 06/28/2018 was normal with a T score of -0.8 in the right femoral neck.  She has B12 deficiency. B12 was 232 on 08/09/2018 and 386 on 10/24/2018. She began B12 injectionson 08/15/2018. She receives B12 monthly at home.  Anti-parietal antibody was elevated (23; 0-20) and intrinsic factor was normal on 10/24/2018.  Symptomatically, she is doing well.  She denies any concerns.  Exam is stable.  Plan: 1.   Labs today:  CBC with diff, CMP, CA27.29, CA19.9. 2.   Multifocal stage IA RIGHT breast cancer Clinically, she is doing well.    Exam reveals no evidence of recurrent disease.    CA27.29 is normal.             Reschedule mammogram.  Continue letrozole. 3.   Elevated liver function tests AST 37.  ALT 105.  Bilirubin 0.5.  Alkaline phosphatase 68.         No liver lesions noted on abdomen and pelvic CT on 10/14/2018.  Patient denies any alcohol use.    Plans for hepatitis testing.   Recheck liver function test next week. 4. B12 deficiency Patient on oral B12.   Parietal cell antibody was positive on 10/24/2018 c/w pernicious anemia.             Check folate periodically. 5. Multilocular mucinous cystadenoma Benign pathology. Some atypical cells noted washings. Abdomen and pelvic CT scans were negative on 10/14/2018. She has a history of increased markers (CA19-9 and CA125). Continue to monitor. 6.   Lab today for hepatitis testing. 7.   RTC in 3 months for MD assessment, labs (CBC with diff, CMP, CA27.29) and review of mammogram.  I discussed the assessment and treatment plan with the patient.  The  patient was provided an opportunity to ask questions and all were answered.  The  patient agreed with the plan and demonstrated an understanding of the instructions.  The patient was advised to call back if the symptoms worsen or if the condition fails to improve as anticipated.  I provided 23 minutes of face-to-face time during this this encounter and > 50% was spent counseling as documented under my assessment and plan.    Lequita Asal, MD, PhD    06/01/2019, 11:56 AM  I, Selena Batten, am acting as scribe for [employer]. Mike Gip, MD, PhD.  I, Melissa C. Mike Gip, MD, have reviewed the above documentation for accuracy and completeness, and I agree with the above.

## 2019-05-31 ENCOUNTER — Ambulatory Visit (INDEPENDENT_AMBULATORY_CARE_PROVIDER_SITE_OTHER): Payer: Medicare Other | Admitting: Psychiatry

## 2019-05-31 ENCOUNTER — Other Ambulatory Visit: Payer: Self-pay

## 2019-05-31 ENCOUNTER — Encounter: Payer: Self-pay | Admitting: Psychiatry

## 2019-05-31 DIAGNOSIS — F411 Generalized anxiety disorder: Secondary | ICD-10-CM

## 2019-05-31 DIAGNOSIS — F3130 Bipolar disorder, current episode depressed, mild or moderate severity, unspecified: Secondary | ICD-10-CM | POA: Diagnosis not present

## 2019-05-31 DIAGNOSIS — F5105 Insomnia due to other mental disorder: Secondary | ICD-10-CM | POA: Diagnosis not present

## 2019-05-31 DIAGNOSIS — F1021 Alcohol dependence, in remission: Secondary | ICD-10-CM

## 2019-05-31 DIAGNOSIS — F09 Unspecified mental disorder due to known physiological condition: Secondary | ICD-10-CM | POA: Diagnosis not present

## 2019-05-31 DIAGNOSIS — G4700 Insomnia, unspecified: Secondary | ICD-10-CM | POA: Insufficient documentation

## 2019-05-31 MED ORDER — RISPERIDONE 3 MG PO TABS: 1.5000 mg | ORAL_TABLET | Freq: Two times a day (BID) | ORAL | 0 refills | Status: DC

## 2019-05-31 MED ORDER — FLUOXETINE HCL 60 MG PO TABS: 60.0000 mg | ORAL_TABLET | Freq: Every day | ORAL | 0 refills | Status: DC

## 2019-05-31 NOTE — Progress Notes (Signed)
Virtual Visit via Telephone Note  I connected with [name] on 05/31/19 at  8:30 AM EDT by telephone and verified that I am speaking with the correct person using two identifiers.   I discussed the limitations, risks, security and privacy concerns of performing an evaluation and management service by telephone and the availability of in person appointments. I also discussed with the patient that there may be a patient responsible charge related to this service. The patient expressed understanding and agreed to proceed.    I discussed the assessment and treatment plan with the patient. The patient was provided an opportunity to ask questions and all were answered. The patient agreed with the plan and demonstrated an understanding of the instructions.   The patient was advised to call back or seek an in-person evaluation if the symptoms worsen or if the condition fails to improve as anticipated.   [facility] MD OP Progress Note  05/31/2019 12:46 PM Sheritta Deeg  MRN:  [id number]  Chief Complaint:  Chief Complaint    Follow-up     HPI: Kendra [name] is a 73 year old Caucasian female, married, lives in [place], has a history of bipolar disorder, GAD, insomnia, cognitive disorder, multifocal stage I right breast cancer status post treatment-lumpectomy, ovarian cyst removal was evaluated by phone today.  Patient preferred to do a phone call.  Patient reports she was recently discharged from inpatient mental health facility.  She was discharged on June 25 per report.  Patient today reports she is currently doing well with regards to her mood symptoms.  She appeared to be alert, oriented to person place situation.  She was able to answer all questions appropriately.  She reports sleep is good.  She reports she is tolerating her medications well.  She denies any side effects.  Collateral information was obtained from [name]-spouse.  Per spouse patient is doing much better.  Per [name] patient  seems to be able to take care of herself.  He was able to leave her alone for a business trip for 10 days and she was able to make use of her social support system and do well during that time.  That is an improvement for her.  Discussed with [name] that I have not received any discharge instruction from her inpatient mental health admission.  [name] was able to clarify her medication changes.  Per [name] patient is on fluoxetine 60 mg and risperidone 3 mg in divided dosage.  [name] reported that he will fax or email her discharge instruction to Probation officer.  Also reviewed medical records from [facility] dated 09/12/2012-' patient with diagnosis of alcohol abuse or dependence major depression with psychotic features patient had ongoing paranoid ideation as well as auditory hallucinations.  Patient also had anxiety symptoms at that time.  Patient also appear to be delusional with ideas of reference.  Medications that were used to treat her-risperidone, Wellbutrin.'  Visit Diagnosis:    ICD-10-CM   1. Bipolar I disorder, most recent episode depressed (HCC)  F31.30 FLUoxetine HCl 60 MG TABS    risperiDONE (RISPERDAL) 3 MG tablet  2. GAD (generalized anxiety disorder)  F41.1 FLUoxetine HCl 60 MG TABS    risperiDONE (RISPERDAL) 3 MG tablet  3. Insomnia due to mental condition  F51.05 FLUoxetine HCl 60 MG TABS  4. Mild cognitive disorder  F09   5. Alcohol use disorder, moderate, in sustained remission ([facility])  F10.21     Past Psychiatric History: Reviewed past psychiatric history from progress  note on 03/01/2019.  Patient with most recent inpatient mental health admission-discharged on June 25.  Past trials of risperidone, Prozac, Ativan.  Past Medical History:  Past Medical History:  Diagnosis Date  . Anxiety   . Breast cancer ([place]) 03/2018   right breast  . Complication of anesthesia   . Depression   . Family history of breast cancer 03/24/2018  . Family history of  lung cancer 03/24/2018  . History of alcohol dependence ([place]) 2007   no ETOH; resolved since 2007  . History of psychosis 2014   due to sleep disturbance  . Hypertension   . Personal history of radiation therapy   . PONV (postoperative nausea and vomiting)     Past Surgical History:  Procedure Laterality Date  . BREAST LUMPECTOMY WITH RADIOACTIVE SEED AND SENTINEL LYMPH NODE BIOPSY Right 04/27/2018   Procedure: RIGHT BREAST LUMPECTOMY WITH RADIOACTIVE SEED X 2 AND RIGHT SENTINEL LYMPH NODE BIOPSY;  Surgeon: [name], MD;  Location: [facility];  Service: General;  Laterality: Right;  . LAPAROTOMY N/A 02/10/2018   Procedure: EXPLORATORY LAPAROTOMY;  Surgeon: Isabel Caprice, MD;  Location: [facility];  Service: Gynecology;  Laterality: N/A;  . MASS EXCISION  01/2018   abdominal  . OVARIAN CYST REMOVAL    . SALPINGOOPHORECTOMY Bilateral 02/10/2018   Procedure: BILATERAL SALPINGO OOPHORECTOMY; PERITONEAL WASHINGS;  Surgeon: Isabel Caprice, MD;  Location: [facility];  Service: Gynecology;  Laterality: Bilateral;  . TONSILLECTOMY    . TONSILLECTOMY AND ADENOIDECTOMY  1953    Family Psychiatric History: I have reviewed family psychiatric history from my progress note on 03/01/2019. Family History:  Family History  Problem Relation Age of Onset  . Diabetes Mother   . Breast cancer Mother 49  . Hypertension Mother   . Lung cancer Father        asbestos exposure  . Heart disease Brother 2  . Breast cancer Cousin 31       paternal cousin  . Heart disease Paternal Grandmother 56  . Heart disease Paternal Grandfather     Social History: I have reviewed social history from my progress note on 03/01/2019. Social History   Socioeconomic History  . Marital status: Married    Spouse name: robert  . Number of children: 0  . Years of education: Not on file  . Highest education level: Some college, no degree  Occupational History  . Occupation: retired Doctor, [employer]  Social Needs  . Financial resource strain: Not hard at all  . Food insecurity    Worry: Never true    Inability: Never true  . Transportation needs    Medical: No    Non-medical: No  Tobacco Use  . Smoking status: Never Smoker  . Smokeless tobacco: Never Used  Substance and Sexual Activity  . Alcohol use: Not Currently    Frequency: Never    Comment: alcohol dependence prior to 2007  . Drug use: Never  . Sexual activity: Yes    Partners: Male    Birth control/protection: Surgical  Lifestyle  . Physical activity    Days per week: 5 days    Minutes per session: 40 min  . Stress: Very much  Relationships  . Social Herbalist on phone: Not on file    Gets together: Not on file    Attends religious service: More than 4 times per year    Active member of club or organization: Yes  Attends meetings of clubs or organizations: 1 to 4 times per year    Relationship status: Married  Other Topics Concern  . Not on file  Social History Narrative  . Not on file    Allergies:  Allergies  Allergen Reactions  . Hydroxyzine Anaphylaxis    Tongue swollen  . Shrimp [Shellfish Allergy] Other (See Comments)    Food poisoning     Metabolic Disorder Labs: Lab Results  Component Value Date   HGBA1C 5.4 04/13/2019   Lab Results  Component Value Date   PROLACTIN 101.9 06/20/2015   Lab Results  Component Value Date   CHOL 173 04/13/2019   TRIG 78 04/13/2019   HDL 62 04/13/2019   LDLCALC 95 04/13/2019   LDLCALC 103 06/02/2017   Lab Results  Component Value Date   TSH 2.31 04/13/2019   TSH 2.543 07/25/2018    Therapeutic Level Labs: No results found for: LITHIUM No results found for: VALPROATE No components found for:  CBMZ  Current Medications: Current Outpatient Medications  Medication Sig Dispense Refill  . cyanocobalamin (,VITAMIN B-12,) 1000 MCG/ML injection Inject 1 mL (1,000 mcg total) into the muscle every 30 (thirty) days.  (Patient not taking: Reported on 04/10/2019) 3 mL 3  . FLUoxetine HCl 60 MG TABS Take 60 mg by mouth daily. 90 tablet 0  . letrozole (FEMARA) 2.5 MG tablet Take 1 tablet (2.5 mg total) by mouth daily. 90 tablet 3  . lisinopril-hydrochlorothiazide (PRINZIDE,ZESTORETIC) 20-12.5 MG tablet Take 1 tablet by mouth daily. 90 tablet 3  . risperiDONE (RISPERDAL) 3 MG tablet Take 0.5 tablets (1.5 mg total) by mouth 2 (two) times daily. 90 tablet 0  . Vitamin D, Ergocalciferol, (DRISDOL) 1.25 MG (50000 UT) CAPS capsule      No current facility-administered medications for this visit.      Musculoskeletal: Strength & Muscle Tone: UTA Gait & Station: UTA Patient leans: N/A  Psychiatric Specialty Exam: Review of Systems  Psychiatric/Behavioral: Negative for hallucinations. The patient is not nervous/anxious.   All other systems reviewed and are negative.   There were no vitals taken for this visit.There is no height or weight on file to calculate BMI.  General Appearance: UTA  Eye Contact:  UTA  Speech:  Clear and Coherent  Volume:  Normal  Mood:  Euthymic  Affect:  UTA  Thought Process:  Goal Directed and Descriptions of Associations: Intact  Orientation:  Full (Time, Place, and Person)  Thought Content: Logical   Suicidal Thoughts:  No  Homicidal Thoughts:  No  Memory:  Immediate;   Fair Recent;   Fair Remote;   Fair  Judgement:  Fair  Insight:  Fair  Psychomotor Activity:  UTA  Concentration:  Concentration: Fair and Attention Span: Fair  Recall:  [employer] of Knowledge: Fair  Language: Fair  Akathisia:  No  Handed:  Right  AIMS (if indicated): UTA  Assets:  Communication Skills Desire for Improvement Housing Social Support  ADL's:  Intact  Cognition: Impaired,  Mild  Sleep:  Fair   Screenings: PHQ2-9     Office Visit from 11/24/2018 in [facility]  PHQ-2 Total Score  0  PHQ-9 Total Score  3       Assessment and Plan: Kendra [name] is a 73 year old Caucasian  female, married, lives in [place], has a history of bipolar disorder, GAD, cognitive problems, alcohol use disorder in remission, breast cancer history, history of ovarian cyst was evaluated by telemedicine today.  She is biologically predisposed  given her multiple health problems.  She also has psychosocial stressors of recent COVID-19 outbreak.  Patient is currently making progress on the current medication regimen after her recent discharge from inpatient mental health facility.  Plan For bipolar disorder-improving Continue risperidone 3 mg p.o. daily in divided dosage Continue Prozac 60 mg p.o. daily Medication verified with husband-[name].  He agrees to fax the most recent discharge instruction to Probation officer.  Also provided him email ID: [email or web address].  For GAD-improving Continue psychotherapy sessions with Ms. Cecilie Lowers Continue Prozac  For insomnia-improving Melatonin 10 mg p.o. nightly  For cognitive changes-patient will continue to work with her neurologist-Dr. Manuella Ghazi.  Patient does have a history of alcoholism as well as chronic mental health problems, polypharmacy which could also contribute to cognitive issues.  However today patient appeared to be alert oriented and was able to give appropriate answers to all questions asked.  I have reviewed medical records from [facility]-dated 09/12/2012-as summarized above.  Follow-up in clinic in 4 weeks or sooner if needed.  September 1 at 11:45 AM  I have spent atleast 25 minutes non face to face with patient today. More than 50 % of the time was spent for psychoeducation and supportive psychotherapy and care coordination.  This note was generated in part or whole with voice recognition software. Voice recognition is usually quite accurate but there are transcription errors that can and very often do occur. I apologize for any typographical errors that were not detected and  corrected.        Ursula Alert, MD 05/31/2019, 12:46 PM

## 2019-06-01 ENCOUNTER — Inpatient Hospital Stay: Payer: Medicare Other | Attending: Hematology and Oncology | Admitting: Hematology and Oncology

## 2019-06-01 ENCOUNTER — Inpatient Hospital Stay: Payer: Medicare Other

## 2019-06-01 ENCOUNTER — Telehealth: Payer: Self-pay

## 2019-06-01 ENCOUNTER — Encounter: Payer: Self-pay | Admitting: Hematology and Oncology

## 2019-06-01 VITALS — BP 115/50 | HR 63 | Temp 97.2°F | Resp 18 | Ht 66.0 in | Wt 174.1 lb

## 2019-06-01 DIAGNOSIS — Z79899 Other long term (current) drug therapy: Secondary | ICD-10-CM | POA: Insufficient documentation

## 2019-06-01 DIAGNOSIS — Z79811 Long term (current) use of aromatase inhibitors: Secondary | ICD-10-CM | POA: Diagnosis not present

## 2019-06-01 DIAGNOSIS — Z923 Personal history of irradiation: Secondary | ICD-10-CM | POA: Insufficient documentation

## 2019-06-01 DIAGNOSIS — Z803 Family history of malignant neoplasm of breast: Secondary | ICD-10-CM | POA: Insufficient documentation

## 2019-06-01 DIAGNOSIS — E538 Deficiency of other specified B group vitamins: Secondary | ICD-10-CM | POA: Diagnosis not present

## 2019-06-01 DIAGNOSIS — R61 Generalized hyperhidrosis: Secondary | ICD-10-CM | POA: Insufficient documentation

## 2019-06-01 DIAGNOSIS — R0602 Shortness of breath: Secondary | ICD-10-CM | POA: Diagnosis not present

## 2019-06-01 DIAGNOSIS — Z170 Estrogen receptor positive status [ER+]: Secondary | ICD-10-CM

## 2019-06-01 DIAGNOSIS — R232 Flushing: Secondary | ICD-10-CM | POA: Diagnosis not present

## 2019-06-01 DIAGNOSIS — R7989 Other specified abnormal findings of blood chemistry: Secondary | ICD-10-CM

## 2019-06-01 DIAGNOSIS — C50211 Malignant neoplasm of upper-inner quadrant of right female breast: Secondary | ICD-10-CM | POA: Diagnosis not present

## 2019-06-01 DIAGNOSIS — Z801 Family history of malignant neoplasm of trachea, bronchus and lung: Secondary | ICD-10-CM | POA: Diagnosis not present

## 2019-06-01 DIAGNOSIS — Z8249 Family history of ischemic heart disease and other diseases of the circulatory system: Secondary | ICD-10-CM | POA: Diagnosis not present

## 2019-06-01 DIAGNOSIS — K5900 Constipation, unspecified: Secondary | ICD-10-CM | POA: Diagnosis not present

## 2019-06-01 DIAGNOSIS — E871 Hypo-osmolality and hyponatremia: Secondary | ICD-10-CM

## 2019-06-01 LAB — COMPREHENSIVE METABOLIC PANEL
ALT: 105 U/L — ABNORMAL HIGH (ref 0–44)
AST: 37 U/L (ref 15–41)
Albumin: 3.7 g/dL (ref 3.5–5.0)
Alkaline Phosphatase: 68 U/L (ref 38–126)
Anion gap: 8 (ref 5–15)
BUN: 11 mg/dL (ref 8–23)
CO2: 25 mmol/L (ref 22–32)
Calcium: 9.2 mg/dL (ref 8.9–10.3)
Chloride: 98 mmol/L (ref 98–111)
Creatinine, Ser: 0.72 mg/dL (ref 0.44–1.00)
GFR calc Af Amer: >60 mL/min (ref >60)
GFR calc non Af Amer: >60 mL/min (ref >60)
Glucose, Bld: 116 mg/dL — ABNORMAL HIGH (ref 70–99)
Potassium: 3.9 mmol/L (ref 3.5–5.1)
Sodium: 131 mmol/L — ABNORMAL LOW (ref 135–145)
Total Bilirubin: 0.5 mg/dL (ref 0.3–1.2)
Total Protein: 6.4 g/dL — ABNORMAL LOW (ref 6.5–8.1)

## 2019-06-01 LAB — CBC WITH DIFFERENTIAL/PLATELET
Abs Immature Granulocytes: 0.02 10*3/uL (ref 0.00–0.07)
Basophils Absolute: 0 10*3/uL (ref 0.0–0.1)
Basophils Relative: 1 %
Eosinophils Absolute: 0.2 10*3/uL (ref 0.0–0.5)
Eosinophils Relative: 3 %
HCT: 35.6 % — ABNORMAL LOW (ref 36.0–46.0)
Hemoglobin: 11.6 g/dL — ABNORMAL LOW (ref 12.0–15.0)
Immature Granulocytes: 0 %
Lymphocytes Relative: 24 %
Lymphs Abs: 1.1 10*3/uL (ref 0.7–4.0)
MCH: 29.1 pg (ref 26.0–34.0)
MCHC: 32.6 g/dL (ref 30.0–36.0)
MCV: 89.4 fL (ref 80.0–100.0)
Monocytes Absolute: 0.5 10*3/uL (ref 0.1–1.0)
Monocytes Relative: 10 %
Neutro Abs: 2.8 10*3/uL (ref 1.7–7.7)
Neutrophils Relative %: 62 %
Platelets: 212 10*3/uL (ref 150–400)
RBC: 3.98 MIL/uL (ref 3.87–5.11)
RDW: 15.1 % (ref 11.5–15.5)
WBC: 4.6 10*3/uL (ref 4.0–10.5)
nRBC: 0 % (ref 0.0–0.2)

## 2019-06-01 NOTE — Telephone Encounter (Signed)
Please order a CMP and associate with hyponatremia and elevated LFTs and leave this up front for the patient

## 2019-06-01 NOTE — Telephone Encounter (Signed)
Left message to call back regarding lab results.

## 2019-06-01 NOTE — Progress Notes (Signed)
No new changes noted today 

## 2019-06-01 NOTE — Telephone Encounter (Signed)
Pt returned missed call.  Please call pt back at [phone].  Thanks, [employer]

## 2019-06-01 NOTE — Telephone Encounter (Signed)
-----   Message from Virginia Crews, MD sent at 06/01/2019 12:49 PM EDT ----- Patient with low sodium and slightly elevated liver function test.  Any changes to medications, supplements, tylenol, alcohol?  Should repeat labs in 1 week. ----- Message ----- From: Arlan Organ, RN Sent: 06/01/2019  12:20 PM EDT To: Virginia Crews, MD  Forwarding for you to review labs per Dr. Kem Parkinson request. Thanks!

## 2019-06-01 NOTE — Telephone Encounter (Signed)
Patient states that she is taking Caltrate daily, but no other medications changes. Notified to have labs done next week.

## 2019-06-02 ENCOUNTER — Inpatient Hospital Stay: Payer: Medicare Other

## 2019-06-02 ENCOUNTER — Other Ambulatory Visit: Payer: Self-pay

## 2019-06-02 LAB — CANCER ANTIGEN 27.29: CA 27.29: 23.5 U/mL (ref 0.0–38.6)

## 2019-06-02 LAB — HEPATITIS PANEL, ACUTE
HCV Ab: <0.1 s/co ratio (ref 0.0–0.9)
Hep A IgM: NEGATIVE
Hep B C IgM: NEGATIVE
Hepatitis B Surface Ag: NEGATIVE

## 2019-06-02 LAB — HEPATITIS B CORE ANTIBODY, TOTAL: Hep B Core Total Ab: NEGATIVE

## 2019-06-02 NOTE — Telephone Encounter (Signed)
Lab ordered and printed at the front desk for pick up.

## 2019-06-02 NOTE — Addendum Note (Signed)
Addended by: Jules Schick on: 06/02/2019 11:02 AM   Modules accepted: Orders

## 2019-06-05 ENCOUNTER — Encounter: Payer: Self-pay | Admitting: Licensed Clinical Social Worker

## 2019-06-05 ENCOUNTER — Ambulatory Visit (INDEPENDENT_AMBULATORY_CARE_PROVIDER_SITE_OTHER): Payer: Medicare Other | Admitting: Licensed Clinical Social Worker

## 2019-06-05 ENCOUNTER — Other Ambulatory Visit: Payer: Self-pay

## 2019-06-05 DIAGNOSIS — F3130 Bipolar disorder, current episode depressed, mild or moderate severity, unspecified: Secondary | ICD-10-CM

## 2019-06-05 DIAGNOSIS — F411 Generalized anxiety disorder: Secondary | ICD-10-CM | POA: Diagnosis not present

## 2019-06-05 NOTE — Progress Notes (Signed)
Virtual Visit via Video Note  I connected with Kendra [name] on 06/05/19 at  8:00 AM EDT by a video enabled telemedicine application and verified that I am speaking with the correct person using two identifiers.   I discussed the limitations of evaluation and management by telemedicine and the availability of in person appointments. The patient expressed understanding and agreed to proceed.  I discussed the assessment and treatment plan with the patient. The patient was provided an opportunity to ask questions and all were answered. The patient agreed with the plan and demonstrated an understanding of the instructions.   The patient was advised to call back or seek an in-person evaluation if the symptoms worsen or if the condition fails to improve as anticipated.  I provided 55 minutes of non-face-to-face time during this encounter.   Alden Hipp, LCSW    THERAPIST PROGRESS NOTE  Session Time: 0800  Participation Level: Active  Behavioral Response: NeatAlertAnxious  Type of Therapy: Individual Therapy  Treatment Goals addressed: Anxiety  Interventions: CBT  Summary: Cynia Abruzzo is a 73 y.o. female who presents with continued symptoms related to her diagnosis. Anjani was joined by her husband, [name], for her session. Taliah reports doing well since our last session, and stated she has experienced a decrease in anxiety symptoms. She reports feeling less fearful of people when leaving the house, and reported feeling less anxious in the evenings. Myla reports ongoing anxiety around feeling she caused various accidents. LCSW validated these thoughts with Mandisa, and asked her to utilize CBT to manage those anxious thoughts. We discussed ways to challenge negative thoughts, and LCSW provided Shahara and her husband with handouts via email to assist with that process. Raisa expressed understanding and agreement. She reported having anxiety after having a bad dream as well. We discussed  utilizing the same logic of CBT to that situation as well. Bettina expressed understanding and agreement. LCSW also encouraged Yuri to allow herself a set amount of time in the morning to ponder her dream, and then set a boundary with herself to move on. Amica expressed understanding and agreement with this addition as well.    Suicidal/Homicidal: No  Therapist Response: Jacara continues to work towards her tx goals but has not yet reached them. We will continue to work on emotional regulation skills moving forward and continue to improve CBT skills. Denisha requested to move to every two weeks, which LCSW was able to accommodate.   Plan: Return again in 2 weeks.  Diagnosis: Axis I: Bipolar 1 Disorder    Axis II: No diagnosis    Alden Hipp, LCSW 06/05/2019

## 2019-06-07 LAB — CANCER ANTIGEN 19-9: CA 19-9: 52 U/mL — ABNORMAL HIGH (ref 0–35)

## 2019-06-09 ENCOUNTER — Telehealth: Payer: Self-pay | Admitting: Family Medicine

## 2019-06-09 NOTE — Telephone Encounter (Signed)
Pt asking for Dr. B to take a look at her July 30th labs ordered by Dr. Mike Gip.  Some of the results were out of range and would like for Dr. Sharmaine Base opinion.  Please call pt back to discuss asap.  Thanks, [employer]

## 2019-06-12 ENCOUNTER — Other Ambulatory Visit: Payer: Self-pay

## 2019-06-12 ENCOUNTER — Ambulatory Visit
Admission: RE | Admit: 2019-06-12 | Discharge: 2019-06-12 | Disposition: A | Discharge status: Home / Self Care | Payer: Medicare Other | Source: Ambulatory Visit | Attending: Hematology and Oncology | Admitting: Hematology and Oncology

## 2019-06-12 DIAGNOSIS — C50211 Malignant neoplasm of upper-inner quadrant of right female breast: Secondary | ICD-10-CM | POA: Diagnosis not present

## 2019-06-12 DIAGNOSIS — Z170 Estrogen receptor positive status [ER+]: Secondary | ICD-10-CM | POA: Diagnosis present

## 2019-06-12 NOTE — Telephone Encounter (Signed)
It looks like Dr Mike Gip is working up the elevated LFTs.  She has plans to check for hepatitis and recheck LFTs.  I agree with her plan.

## 2019-06-13 NOTE — Telephone Encounter (Signed)
Patient was advised and states that she will come in to get her labs drawn that was suppose to be drawn on last week.

## 2019-06-13 NOTE — Telephone Encounter (Signed)
LVMTRC 

## 2019-06-15 ENCOUNTER — Telehealth: Payer: Self-pay

## 2019-06-15 DIAGNOSIS — R7989 Other specified abnormal findings of blood chemistry: Secondary | ICD-10-CM

## 2019-06-15 LAB — COMPREHENSIVE METABOLIC PANEL
ALT: 40 IU/L — ABNORMAL HIGH (ref 0–32)
AST: 29 IU/L (ref 0–40)
Albumin/Globulin Ratio: 1.9 (ref 1.2–2.2)
Albumin: 3.9 g/dL (ref 3.7–4.7)
Alkaline Phosphatase: 65 IU/L (ref 39–117)
BUN/Creatinine Ratio: 14 (ref 12–28)
BUN: 10 mg/dL (ref 8–27)
Bilirubin Total: 0.4 mg/dL (ref 0.0–1.2)
CO2: 21 mmol/L (ref 20–29)
Calcium: 9.2 mg/dL (ref 8.7–10.3)
Chloride: 99 mmol/L (ref 96–106)
Creatinine, Ser: 0.71 mg/dL (ref 0.57–1.00)
GFR calc Af Amer: 99 mL/min/{1.73_m2} (ref >59)
GFR calc non Af Amer: 86 mL/min/{1.73_m2} (ref >59)
Globulin, Total: 2.1 g/dL (ref 1.5–4.5)
Glucose: 81 mg/dL (ref 65–99)
Potassium: 4.1 mmol/L (ref 3.5–5.2)
Sodium: 137 mmol/L (ref 134–144)
Total Protein: 6 g/dL (ref 6.0–8.5)

## 2019-06-15 NOTE — Telephone Encounter (Signed)
-----   Message from Virginia Crews, MD sent at 06/15/2019  8:40 AM EDT ----- LFTs are improving.  Recheck in 1 more month

## 2019-06-15 NOTE — Telephone Encounter (Signed)
Patient advised. Lab slip printed at the front desk for pick up in 1 month.

## 2019-06-22 ENCOUNTER — Telehealth: Payer: Self-pay

## 2019-06-22 NOTE — Telephone Encounter (Signed)
pt called left message that she needed refills on all her medications.

## 2019-06-22 NOTE — Telephone Encounter (Signed)
sppoke with patient. medications were sent into pharmacy in july for a 90 day supply. pt states she didnt have any told pt to call pharmacy and ask if they have a rx on hold. pt also asked if you had sent her vit d.  pt was told that you did not refill the vitamin d and looks like the last time labwork was done to check vit d levels was in June. Pt was told that you would be back in the office on Tuesday and that I would ask if she would take over care for vit d and also advised pt that she maybe asked to have her vit d levels check again but that dr. Shea Evans or myself would let her know on Tuesday.

## 2019-06-26 ENCOUNTER — Encounter: Payer: Self-pay | Admitting: Licensed Clinical Social Worker

## 2019-06-26 ENCOUNTER — Ambulatory Visit (INDEPENDENT_AMBULATORY_CARE_PROVIDER_SITE_OTHER): Payer: Medicare Other | Admitting: Licensed Clinical Social Worker

## 2019-06-26 ENCOUNTER — Other Ambulatory Visit: Payer: Self-pay

## 2019-06-26 DIAGNOSIS — F3130 Bipolar disorder, current episode depressed, mild or moderate severity, unspecified: Secondary | ICD-10-CM | POA: Diagnosis not present

## 2019-06-26 NOTE — Progress Notes (Signed)
Virtual Visit via Video Note  I connected with Kendra [name] on 06/26/19 at 10:00 AM EDT by a video enabled telemedicine application and verified that I am speaking with the correct person using two identifiers.   I discussed the limitations of evaluation and management by telemedicine and the availability of in person appointments. The patient expressed understanding and agreed to proceed.  I discussed the assessment and treatment plan with the patient. The patient was provided an opportunity to ask questions and all were answered. The patient agreed with the plan and demonstrated an understanding of the instructions.   The patient was advised to call back or seek an in-person evaluation if the symptoms worsen or if the condition fails to improve as anticipated.  I provided 30 minutes of non-face-to-face time during this encounter.   Alden Hipp, LCSW    THERAPIST PROGRESS NOTE  Session Time: 1000  Participation Level: Active  Behavioral Response: CasualAlertAnxious  Type of Therapy: Individual Therapy  Treatment Goals addressed: Anxiety  Interventions: CBT  Summary: Kendra [name] is a 73 y.o. female who presents with continued symptoms related to her diagnosis. Pleasant reports, since our last session, she has utilized the resources LCSW emailed to her to assist in facilitating use of CBT skills. Ronelle reported, "putting my thoughts on trial has been the best part of the skills you sent over." LCSW encouraged Brizza to walk LCSW through several examples of times she utilized those skills. She reported it has been especially helpful when worrying about her finances, "I tell myself how much money I have in my account, how much money I have in savings, and I go through all of that information." Corona reported after utilizing CBT skills, she felt much, much better. She reported she was able to talk herself through feeling worried about money. She reported she has been able to do this with  other areas of life as well, including feeling as though she caused certain accidents. Tynisha reported she has been challenging her negative thoughts in other areas as well, "I've been fearful of people but I'm going on a trip with one of my friends coming up." Braelyn reported she was able to challenge her anxious thoughts around the situation. LCSW validated Appolonia's use of CBT skills and encouraged her to keep doing what has worked for her thus far. She reports she feels her medications are working well. LCSW agreed with this idea, and noted it's only one piece of the puzzle--but therapy is another piece. Alexias expressed agreement and understanding.    Suicidal/Homicidal: No  Therapist Response: Jennica continues to work towards her tx goals She has been able to utilize CBT skills to decrease anxiety in a variety of situations since our last session. We will continue to work on improving emotional regulation skills through utilizing CBT moving forward.   Plan: Return again in 4 weeks.  Diagnosis: Axis I: Bipolar I disorder    Axis II: No diagnosis    Alden Hipp, LCSW 06/26/2019

## 2019-06-27 NOTE — Telephone Encounter (Signed)
Please let patient know to talk to her PMD about her Vitamin D replacement . Thanks

## 2019-07-04 ENCOUNTER — Encounter: Payer: Self-pay | Admitting: Psychiatry

## 2019-07-04 ENCOUNTER — Other Ambulatory Visit: Payer: Self-pay

## 2019-07-04 ENCOUNTER — Ambulatory Visit (INDEPENDENT_AMBULATORY_CARE_PROVIDER_SITE_OTHER): Payer: Medicare Other | Admitting: Psychiatry

## 2019-07-04 DIAGNOSIS — F5105 Insomnia due to other mental disorder: Secondary | ICD-10-CM | POA: Diagnosis not present

## 2019-07-04 DIAGNOSIS — F411 Generalized anxiety disorder: Secondary | ICD-10-CM

## 2019-07-04 DIAGNOSIS — F09 Unspecified mental disorder due to known physiological condition: Secondary | ICD-10-CM

## 2019-07-04 DIAGNOSIS — F3130 Bipolar disorder, current episode depressed, mild or moderate severity, unspecified: Secondary | ICD-10-CM

## 2019-07-04 DIAGNOSIS — F1021 Alcohol dependence, in remission: Secondary | ICD-10-CM

## 2019-07-04 NOTE — Progress Notes (Signed)
Virtual Visit via Video Note  I connected with Kendra [name] on 07/04/19 at 11:45 AM EDT by a video enabled telemedicine application and verified that I am speaking with the correct person using two identifiers.   I discussed the limitations of evaluation and management by telemedicine and the availability of in person appointments. The patient expressed understanding and agreed to proceed.   I discussed the assessment and treatment plan with the patient. The patient was provided an opportunity to ask questions and all were answered. The patient agreed with the plan and demonstrated an understanding of the instructions.   The patient was advised to call back or seek an in-person evaluation if the symptoms worsen or if the condition fails to improve as anticipated.  [place] MD OP Progress Note  07/04/2019 12:21 PM Kendra [name]  MRN:  [id number]  Chief Complaint:  Chief Complaint    Follow-up     HPI: Kendra [name] is a 73 year old Caucasian female, married, lives in [place], has a history of bipolar disorder, GAD, insomnia, cognitive disorder, multifocal stage I right breast cancer status post treatment-lumpectomy, ovarian cyst removal was evaluated by telemedicine today.  Patient today reports she is currently doing well on the current medication regimen.  She reports she has been less anxious and less depressed.  She is compliant on Prozac as well as the risperidone.  She however reports she continues to take the Seroquel at bedtime for sleep.  Discussed with patient that she is no longer on the Seroquel and that it was changed to risperidone before last visit.  Discussed with patient to make use of melatonin and take the risperidone at bedtime for sleep.  If she continues to struggle could try a new sleep medication like trazodone.  Patient denies any perceptual disturbances.  She reports her appetite is fair except for the fact that she craves sugary things often. It may likely be due to to  seroquel. Discussed to make lifestyle changes and to make healthy choices with her diet.  She is in therapy with Ms. Alden Hipp and reports it is going well.  She is alert, oriented to person place time situation.  Her husband [name]-provided collateral information and reported that patient is doing well. Visit Diagnosis:    ICD-10-CM   1. Bipolar I disorder, most recent episode depressed ([place])  F31.30    stable  2. GAD (generalized anxiety disorder)  F41.1   3. Insomnia due to mental condition  F51.05   4. Mild cognitive disorder  F09   5. Alcohol use disorder, moderate, in sustained remission ([place])  F10.21     Past Psychiatric History: I have reviewed past psychiatric history from my progress note on 03/01/2019.  Past trials of risperidone, Prozac, Seroquel, Ativan.  Patient was most recently discharged from inpatient mental health admission on June 25.  Past Medical History:  Past Medical History:  Diagnosis Date  . Anxiety   . Breast cancer ([place]) 03/2018   right breast cancer at 11:00 and 1:00  . Complication of anesthesia   . Depression   . Family history of breast cancer 03/24/2018  . Family history of lung cancer 03/24/2018  . History of alcohol dependence ([place]) 2007   no ETOH; resolved since 2007  . History of psychosis 2014   due to sleep disturbance  . Hypertension   . Personal history of radiation therapy 06/2018-07/2018   right breast ca  . PONV (postoperative nausea and vomiting)     Past Surgical History:  Procedure Laterality Date  . BREAST BIOPSY Right 03/14/2018   11:00 DCIS and invasive ductal carcinoma  . BREAST BIOPSY Right 03/14/2018   1:00 Invasive ductal carcinoma  . BREAST LUMPECTOMY Right 04/27/2018   lumpectomy of 11 and 1:00 cancers, clear margins, negative LN  . BREAST LUMPECTOMY WITH RADIOACTIVE SEED AND SENTINEL LYMPH NODE BIOPSY Right 04/27/2018   Procedure: RIGHT BREAST LUMPECTOMY WITH RADIOACTIVE SEED X 2 AND RIGHT SENTINEL LYMPH NODE  BIOPSY;  Surgeon: [name], MD;  Location: [facility];  Service: General;  Laterality: Right;  . LAPAROTOMY N/A 02/10/2018   Procedure: EXPLORATORY LAPAROTOMY;  Surgeon: Isabel Caprice, MD;  Location: [facility];  Service: Gynecology;  Laterality: N/A;  . MASS EXCISION  01/2018   abdominal  . OVARIAN CYST REMOVAL    . SALPINGOOPHORECTOMY Bilateral 02/10/2018   Procedure: BILATERAL SALPINGO OOPHORECTOMY; PERITONEAL WASHINGS;  Surgeon: Isabel Caprice, MD;  Location: [facility];  Service: Gynecology;  Laterality: Bilateral;  . TONSILLECTOMY    . TONSILLECTOMY AND ADENOIDECTOMY  1953    Family Psychiatric History: I have reviewed family psychiatric history from my progress note on 03/01/2019  Family History:  Family History  Problem Relation Age of Onset  . Diabetes Mother   . Breast cancer Mother 91  . Hypertension Mother   . Lung cancer Father        asbestos exposure  . Heart disease Brother 41  . Breast cancer Cousin 42       paternal cousin  . Heart disease Paternal Grandmother 71  . Heart disease Paternal Grandfather     Social History: I have reviewed social history from my progress note on 03/01/2019 Social History   Socioeconomic History  . Marital status: Married    Spouse name: robert  . Number of children: 0  . Years of education: Not on file  . Highest education level: Some college, no degree  Occupational History  . Occupation: retired Furniture conservator/restorer of education  Social Needs  . Financial resource strain: Not hard at all  . Food insecurity    Worry: Never true    Inability: Never true  . Transportation needs    Medical: No    Non-medical: No  Tobacco Use  . Smoking status: Never Smoker  . Smokeless tobacco: Never Used  Substance and Sexual Activity  . Alcohol use: Not Currently    Frequency: Never    Comment: alcohol dependence prior to 2007  . Drug use: Never  . Sexual activity: Yes    Partners: Male    Birth  control/protection: Surgical  Lifestyle  . Physical activity    Days per week: 5 days    Minutes per session: 40 min  . Stress: Very much  Relationships  . Social Herbalist on phone: Not on file    Gets together: Not on file    Attends religious service: More than 4 times per year    Active member of club or organization: Yes    Attends meetings of clubs or organizations: 1 to 4 times per year    Relationship status: Married  Other Topics Concern  . Not on file  Social History Narrative  . Not on file    Allergies:  Allergies  Allergen Reactions  . Hydroxyzine Anaphylaxis    Tongue swollen  . Shrimp [Shellfish Allergy] Other (See Comments)    Food poisoning     Metabolic Disorder Labs: Lab Results  Component Value  Date   HGBA1C 5.4 04/13/2019   Lab Results  Component Value Date   PROLACTIN 101.9 06/20/2015   Lab Results  Component Value Date   CHOL 173 04/13/2019   TRIG 78 04/13/2019   HDL 62 04/13/2019   LDLCALC 95 04/13/2019   LDLCALC 103 06/02/2017   Lab Results  Component Value Date   TSH 2.31 04/13/2019   TSH 2.543 07/25/2018    Therapeutic Level Labs: No results found for: LITHIUM No results found for: VALPROATE No components found for:  CBMZ  Current Medications: Current Outpatient Medications  Medication Sig Dispense Refill  . cyanocobalamin (,VITAMIN B-12,) 1000 MCG/ML injection Inject 1 mL (1,000 mcg total) into the muscle every 30 (thirty) days. 3 mL 3  . FLUoxetine HCl 60 MG TABS Take 60 mg by mouth daily. 90 tablet 0  . letrozole (FEMARA) 2.5 MG tablet Take 1 tablet (2.5 mg total) by mouth daily. 90 tablet 3  . lisinopril-hydrochlorothiazide (PRINZIDE,ZESTORETIC) 20-12.5 MG tablet Take 1 tablet by mouth daily. 90 tablet 3  . risperiDONE (RISPERDAL) 3 MG tablet Take 0.5 tablets (1.5 mg total) by mouth 2 (two) times daily. 90 tablet 0  . Vitamin D, Ergocalciferol, (DRISDOL) 1.25 MG (50000 UT) CAPS capsule      No current  facility-administered medications for this visit.      Musculoskeletal: Strength & Muscle Tone: UTA Gait & Station: Observed as seated Patient leans: N/A  Psychiatric Specialty Exam: Review of Systems  Psychiatric/Behavioral: The patient is nervous/anxious.   All other systems reviewed and are negative.   There were no vitals taken for this visit.There is no height or weight on file to calculate BMI.  General Appearance: Casual  Eye Contact:  Good  Speech:  Normal Rate  Volume:  Normal  Mood:  Anxious improving  Affect:  Congruent  Thought Process:  Goal Directed and Descriptions of Associations: Intact  Orientation:  Full (Time, Place, and Person)  Thought Content: Logical   Suicidal Thoughts:  No  Homicidal Thoughts:  No  Memory:  Immediate;   Fair Recent;   Fair Remote;   Fair  Judgement:  Fair  Insight:  Fair  Psychomotor Activity:  Normal  Concentration:  Concentration: Fair and Attention Span: Fair  Recall:  [employer] of Knowledge: Fair  Language: Fair  Akathisia:  No  Handed:  Right  AIMS (if indicated): denies tremors, stiffness, rigidty  Assets:  Communication Skills Desire for Improvement Social Support  ADL's:  Intact  Cognition: WNL  Sleep:  Fair   Screenings: PHQ2-9     Office Visit from 11/24/2018 in [facility]  PHQ-2 Total Score  0  PHQ-9 Total Score  3       Assessment and Plan: Kendra [name] is a 73 year old CF, married , lives in [place], has a history of Bipolar disorder, GAD , insomnia, cognitive disorder, multifocal stage 1 right breast cancer S/P lumpectomy, ovarian cyst removal was evaluated by telemedicine today.  Patient is biologically predisposed given her multiple health problems.  She also has psychosocial stressors of the recent COVID-19 pandemic.  She however has good social support system and is currently making progress.  Plan as noted below.  Plan Bipolar disorder-stable Risperidone 3 mg p.o. daily in divided  dosage Prozac 60 mg p.o. daily  For GAD- stable Continue psychotherapy sessions with Ms. Cecilie Lowers Continue Prozac.  Insomnia- stable Continue melatonin 10 mg p.o. nightly Patient reports she has been taking Seroquel, discussed with her that Seroquel  was discontinued and that she is currently on risperidone.  She will stop taking it.  If she continues to have sleep problems we will start trazodone.  For cognitive changes-patient will continue to work with her neurologist.  Collateral information was obtained from husband [name] as summarized above.  Follow-up in clinic in 6 to 8 weeks or sooner if needed.  October 12 at 10:30 AM  I have spent atleast 15 minutes non  face to face with patient today. More than 50 % of the time was spent for psychoeducation and supportive psychotherapy and care coordination. This note was generated in part or whole with voice recognition software. Voice recognition is usually quite accurate but there are transcription errors that can and very often do occur. I apologize for any typographical errors that were not detected and corrected.        Ursula Alert, MD 07/04/2019, 12:21 PM

## 2019-07-10 ENCOUNTER — Ambulatory Visit: Payer: Medicare Other | Admitting: Licensed Clinical Social Worker

## 2019-07-11 ENCOUNTER — Ambulatory Visit (INDEPENDENT_AMBULATORY_CARE_PROVIDER_SITE_OTHER): Payer: Medicare Other | Admitting: Licensed Clinical Social Worker

## 2019-07-11 ENCOUNTER — Encounter: Payer: Self-pay | Admitting: Licensed Clinical Social Worker

## 2019-07-11 ENCOUNTER — Other Ambulatory Visit: Payer: Self-pay

## 2019-07-11 DIAGNOSIS — F3130 Bipolar disorder, current episode depressed, mild or moderate severity, unspecified: Secondary | ICD-10-CM | POA: Diagnosis not present

## 2019-07-11 DIAGNOSIS — F411 Generalized anxiety disorder: Secondary | ICD-10-CM | POA: Diagnosis not present

## 2019-07-11 NOTE — Progress Notes (Signed)
Virtual Visit via Video Note  I connected with [name] on 07/11/19 at 12:30 PM EDT by a video enabled telemedicine application and verified that I am speaking with the correct person using two identifiers.   I discussed the limitations of evaluation and management by telemedicine and the availability of in person appointments. The patient expressed understanding and agreed to proceed.  I discussed the assessment and treatment plan with the patient. The patient was provided an opportunity to ask questions and all were answered. The patient agreed with the plan and demonstrated an understanding of the instructions.   The patient was advised to call back or seek an in-person evaluation if the symptoms worsen or if the condition fails to improve as anticipated.  I provided 54 minutes of non-face-to-face time during this encounter.   Alden Hipp, LCSW    THERAPIST PROGRESS NOTE  Session Time: 1230  Participation Level: Active  Behavioral Response: CasualAlertAnxious  Type of Therapy: Individual Therapy  Treatment Goals addressed: Anxiety  Interventions: CBT  Summary: Estine Couser is a 73 y.o. female who presents with continued symptoms related to her diagnosis. Lindzy reports doing well since our last session. She reports she has continued to utilize CBT skills to challenge negative thoughts. "I've started talking myself through situations that give me anxiety, such as feeling that I've caused disasters and things like that."Brynnley reports she looks for evidence for and against her negative thoughts, and is able to manage her anxiety in those moments. LCSW validated the use of CBT skills and Laneice's continued effort to utilize skills taught during sessions. Veretta gave several examples of how she has utilized CBT in recent stressful moments. Deanndra went on to discuss two social events that she went on. 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 LCSW encouraged Laura-Lee to store this memory as one she can utilize later to reinforce the idea that everything will be okay. Deepti expressed understanding and agreement. She went on to discussing her husband going on a motorcycle trip, which she is not happy about. LCSW encouraged Ralynn to utilize CBT skills for that situation as well, and also encouraged her to view it as a positive. Sonika was then asked to recognize the amount of progress she's made which has allowed her husband to feel comfortable enough to leave her home for an entire week. Annaelle expressed understanding and agreement with this notion as well.   Suicidal/Homicidal: No   Therapist Response: Mariely continues to work towards her tx goals but has not yet reached them. We will continue to work on emotional regulation skills and challenging thoughts by utilizing CBT.   Plan: Return again in 2 weeks.  Diagnosis: Axis I: Bipolar, mixed and Generalized Anxiety Disorder    Axis II: No diagnosis    Alden Hipp, LCSW 07/11/2019

## 2019-07-14 LAB — COMPREHENSIVE METABOLIC PANEL
ALT: 23 IU/L (ref 0–32)
AST: 17 IU/L (ref 0–40)
Albumin/Globulin Ratio: 2.5 — ABNORMAL HIGH (ref 1.2–2.2)
Albumin: 4.2 g/dL (ref 3.7–4.7)
Alkaline Phosphatase: 58 IU/L (ref 39–117)
BUN/Creatinine Ratio: 16 (ref 12–28)
BUN: 11 mg/dL (ref 8–27)
Bilirubin Total: 0.3 mg/dL (ref 0.0–1.2)
CO2: 21 mmol/L (ref 20–29)
Calcium: 9.4 mg/dL (ref 8.7–10.3)
Chloride: 102 mmol/L (ref 96–106)
Creatinine, Ser: 0.7 mg/dL (ref 0.57–1.00)
GFR calc Af Amer: 101 mL/min/{1.73_m2} (ref >59)
GFR calc non Af Amer: 87 mL/min/{1.73_m2} (ref >59)
Globulin, Total: 1.7 g/dL (ref 1.5–4.5)
Glucose: 83 mg/dL (ref 65–99)
Potassium: 4.3 mmol/L (ref 3.5–5.2)
Sodium: 138 mmol/L (ref 134–144)
Total Protein: 5.9 g/dL — ABNORMAL LOW (ref 6.0–8.5)

## 2019-07-18 NOTE — Progress Notes (Signed)
Patient: Kendra [name] Female    DOB: [birth year]   73 y.o.   MRN: [id number] Visit Date: 07/20/2019  Today's Provider: Lavon Paganini, MD   Chief Complaint  Patient presents with  . Hip Pain   Subjective:     Hip Pain  Incident onset: Pt noticed hip pain about two weeks ago.  Right worse than left.  There was no injury mechanism. The pain is present in the right hip, right knee and left hip. The quality of the pain is described as aching and shooting. Associated symptoms include an inability to bear weight, a loss of motion and muscle weakness. Pertinent negatives include no loss of sensation, numbness or tingling. The symptoms are aggravated by weight bearing, movement and palpation. She has tried acetaminophen and rest for the symptoms. The treatment provided mild relief.   Has had intermittent episodes over last year or so (unclear if this correlates with starting letrozole), but worse in last 2 weeks No groin pain Worse with going up and down stairs and walking  No inciting injury or trauma prior to this starting  Tried Tylenol 1000mg  and Oxycontin left over from surgery and was not helpful  Allergies  Allergen Reactions  . Hydroxyzine Anaphylaxis    Tongue swollen  . Shrimp [Shellfish Allergy] Other (See Comments)    Food poisoning      Current Outpatient Medications:  .  buPROPion (WELLBUTRIN XL) 300 MG 24 hr tablet, Take 300 mg by mouth daily., Disp: , Rfl:  .  cyanocobalamin (,VITAMIN B-12,) 1000 MCG/ML injection, Inject 1 mL (1,000 mcg total) into the muscle every 30 (thirty) days., Disp: 3 mL, Rfl: 3 .  FLUoxetine HCl 60 MG TABS, Take 60 mg by mouth daily., Disp: 90 tablet, Rfl: 0 .  letrozole (FEMARA) 2.5 MG tablet, Take 1 tablet (2.5 mg total) by mouth daily., Disp: 90 tablet, Rfl: 3 .  lisinopril-hydrochlorothiazide (PRINZIDE,ZESTORETIC) 20-12.5 MG tablet, Take 1 tablet by mouth daily., Disp: 90 tablet, Rfl: 3 .  risperiDONE (RISPERDAL) 3 MG tablet, Take  0.5 tablets (1.5 mg total) by mouth 2 (two) times daily., Disp: 90 tablet, Rfl: 0 .  Vitamin D, Ergocalciferol, (DRISDOL) 1.25 MG (50000 UT) CAPS capsule, , Disp: , Rfl:   Current Facility-Administered Medications:  .  lidocaine (PF) (XYLOCAINE) 1 % injection 4 mL, 4 mL, Intradermal, Once, Azaryah Heathcock, Dionne Bucy, MD .  lidocaine (PF) (XYLOCAINE) 1 % injection 4 mL, 4 mL, Intradermal, Once, Mehtaab Mayeda, Dionne Bucy, MD .  methylPREDNISolone acetate (DEPO-MEDROL) injection 40 mg, 40 mg, Intramuscular, Once, Rayette Mogg, Dionne Bucy, MD .  methylPREDNISolone acetate (DEPO-MEDROL) injection 40 mg, 40 mg, Intramuscular, Once, Dereon Williamsen, Dionne Bucy, MD  Review of Systems  Constitutional: Negative.   Respiratory: Negative.   Cardiovascular: Positive for leg swelling. Negative for chest pain and palpitations.  Genitourinary: Negative.   Musculoskeletal: Positive for arthralgias, gait problem and myalgias. Negative for back pain, joint swelling, neck pain and neck stiffness.  Neurological: Negative for dizziness, tingling, light-headedness, numbness and headaches.    Social History   Tobacco Use  . Smoking status: Never Smoker  . Smokeless tobacco: Never Used  Substance Use Topics  . Alcohol use: Not Currently    Frequency: Never    Comment: alcohol dependence prior to 2007      Objective:   BP 120/67 (BP Location: Left Arm, Patient Position: Sitting, Cuff Size: Large)   Pulse 74   Temp (!) 96.9 F (36.1 C) (Temporal)   Wt 182 lb (  82.6 kg)   BMI 29.38 kg/m  Vitals:   07/19/19 1451  BP: 120/67  Pulse: 74  Temp: (!) 96.9 F (36.1 C)  TempSrc: Temporal  Weight: 182 lb (82.6 kg)  Body mass index is 29.38 kg/m.   Physical Exam Vitals signs reviewed.  Constitutional:      General: She is not in acute distress.    Appearance: Normal appearance. She is well-developed. She is not diaphoretic.  HENT:     Head: Normocephalic and atraumatic.  Neck:     Musculoskeletal: Neck supple.      Thyroid: No thyromegaly.  Cardiovascular:     Rate and Rhythm: Normal rate and regular rhythm.     Pulses: Normal pulses.     Heart sounds: Normal heart sounds. No murmur.  Pulmonary:     Effort: Pulmonary effort is normal. No respiratory distress.     Breath sounds: Normal breath sounds. No wheezing, rhonchi or rales.  Musculoskeletal:     Right lower leg: No edema.     Left lower leg: No edema.     Comments: Mild tenderness palpation over bilateral SI joints.  Worst tenderness to palpation over greater trochanter of bilateral hips, right greater than left.  Hip range of motion is intact.  Lower extremity strength and sensation is intact.  Lymphadenopathy:     Cervical: No cervical adenopathy.  Skin:    General: Skin is warm and dry.     Capillary Refill: Capillary refill takes less than 2 seconds.     Findings: No rash.  Neurological:     Mental Status: She is alert and oriented to person, place, and time. Mental status is at baseline.     Gait: Gait abnormal (antalgic).  Psychiatric:        Mood and Affect: Mood normal.        Behavior: Behavior normal.    No results found for any visits on 07/19/19.     Assessment & Plan   1. Greater trochanteric bursitis of both hips -New problem -In bilateral hips, but right greater than left -Does have some pain down her IT band as well -Discussed home exercise program, anti-inflammatories, ice, adjusting exercise to what is tolerable - Discussed option for corticosteroid injection for the bursitis today, and patient elects to go ahead with this-see procedure note below -Discussed return precautions  2. Need for influenza vaccination - Flu Vaccine QUAD High Dose(Fluad)    Meds ordered this encounter  Medications  . methylPREDNISolone acetate (DEPO-MEDROL) injection 40 mg  . lidocaine (PF) (XYLOCAINE) 1 % injection 4 mL  . methylPREDNISolone acetate (DEPO-MEDROL) injection 40 mg  . lidocaine (PF) (XYLOCAINE) 1 % injection 4 mL     INJECTION: Patient was given informed consent,. Appropriate time out was taken. Area prepped and draped in usual sterile fashion.  1 cc of depo-medrol 40 mg/ml plus 4 cc of 1% lidocaine without epinephrine was injected into each of the bilateral greater trochanteric bursa using a(n) lateral approach. The patient tolerated the procedure well. There were no complications. Post procedure instructions were given.    Return if symptoms worsen or fail to improve.   The entirety of the information documented in the History of Present Illness, Review of Systems and Physical Exam were personally obtained by me. Portions of this information were initially documented by Ashley Royalty, CMA and reviewed by me for thoroughness and accuracy.    Krystle Oberman, Dionne Bucy, MD MPH [facility]

## 2019-07-19 ENCOUNTER — Encounter: Payer: Self-pay | Admitting: Family Medicine

## 2019-07-19 ENCOUNTER — Ambulatory Visit (INDEPENDENT_AMBULATORY_CARE_PROVIDER_SITE_OTHER): Payer: Medicare Other | Admitting: Licensed Clinical Social Worker

## 2019-07-19 ENCOUNTER — Other Ambulatory Visit: Payer: Self-pay

## 2019-07-19 ENCOUNTER — Encounter: Payer: Self-pay | Admitting: Licensed Clinical Social Worker

## 2019-07-19 ENCOUNTER — Ambulatory Visit (INDEPENDENT_AMBULATORY_CARE_PROVIDER_SITE_OTHER): Payer: Medicare Other | Admitting: Family Medicine

## 2019-07-19 VITALS — BP 120/67 | HR 74 | Temp 96.9°F | Wt 182.0 lb

## 2019-07-19 DIAGNOSIS — F3130 Bipolar disorder, current episode depressed, mild or moderate severity, unspecified: Secondary | ICD-10-CM

## 2019-07-19 DIAGNOSIS — Z23 Encounter for immunization: Secondary | ICD-10-CM

## 2019-07-19 DIAGNOSIS — M7062 Trochanteric bursitis, left hip: Secondary | ICD-10-CM | POA: Diagnosis not present

## 2019-07-19 DIAGNOSIS — M7061 Trochanteric bursitis, right hip: Secondary | ICD-10-CM | POA: Diagnosis not present

## 2019-07-19 MED ORDER — LIDOCAINE HCL (PF) 1 % IJ SOLN: 4.0000 mL | Freq: Once | INTRAMUSCULAR | Status: DC

## 2019-07-19 MED ORDER — METHYLPREDNISOLONE ACETATE 40 MG/ML IJ SUSP: 40.0000 mg | Freq: Once | INTRAMUSCULAR | Status: DC

## 2019-07-19 NOTE — Progress Notes (Signed)
Virtual Visit via Video Note  I connected with Kendra [name] on 07/19/19 at 10:00 AM EDT by a video enabled telemedicine application and verified that I am speaking with the correct person using two identifiers.   I discussed the limitations of evaluation and management by telemedicine and the availability of in person appointments. The patient expressed understanding and agreed to proceed.    I discussed the assessment and treatment plan with the patient. The patient was provided an opportunity to ask questions and all were answered. The patient agreed with the plan and demonstrated an understanding of the instructions.   The patient was advised to call back or seek an in-person evaluation if the symptoms worsen or if the condition fails to improve as anticipated.  I provided 60  minutes of non-face-to-face time during this encounter.   Alden Hipp, LCSW    THERAPIST PROGRESS NOTE  Session Time: 1000  Participation Level: Active  Behavioral Response: NeatAlertAnxious  Type of Therapy: Individual Therapy  Treatment Goals addressed: Anxiety  Interventions: CBT  Summary: Kendra [name] is a 73 y.o. female who presents with continued symptoms related to her diagnosis. Kendra [name] reports doing well since our last session. She reports no moments of extreme anxiety, and stated she has been able to manage anxiety symptoms before they increased to that point. LCSW validated Kendra [name]'s use of CBT skills ot manage anxiety symptoms, and encouraged her to continue using them--as the more you use them, the easier it is. Kendra [name] expressed understanding and agreement. Kendra [name] reported she has gotten more comfortable with the idea of her husband going out of town next month, and has been able to manage her anxiety about that by putting thoughts on trial. She reports she continues to utilize that skill above most others when attempting to manage anxiety. LCSW encouraged this behavior, and highlighted that it is  okay to find one method that works and utilize that primarily. Kendra [name] expressed understanding. LCSW encouraged Kendra [name] to continue writing down moments of anxiety so we are able to review them during sessions, and Kendra [name] expressed agreement. Kendra [name] and her husband both expressed they did not have any concerns at the moment in terms of Kendra [name]'s mental health.  Suicidal/Homicidal: No  Therapist Response: Kendra [name] continues to work towards her tx goals but has not yet reached them. We will continue to work on emotional regulation skills moving forward and will continue to work on improving CBT skills to challenge negative thoughts.   Plan: Return again in 2 weeks.  Diagnosis: Axis I: Bipolar 1    Axis II: No diagnosis    Alden Hipp, LCSW 07/19/2019

## 2019-07-19 NOTE — Patient Instructions (Signed)
Naproxen sodium 1 pill twice daily   Hip Bursitis Rehab Ask your health care provider which exercises are safe for you. Do exercises exactly as told by your health care provider and adjust them as directed. It is normal to feel mild stretching, pulling, tightness, or discomfort as you do these exercises. Stop right away if you feel sudden pain or your pain gets worse. Do not begin these exercises until told by your health care provider. Stretching exercise This exercise warms up your muscles and joints and improves the movement and flexibility of your hip. This exercise also helps to relieve pain and stiffness. Iliotibial band stretch An iliotibial band is a strong band of muscle tissue that runs from the outer side of your hip to the outer side of your thigh and knee. 1. Lie on your side with your left / right leg in the top position. 2. Bend your left / right knee and grab your ankle. Stretch out your bottom arm to help you balance. 3. Slowly bring your knee back so your thigh is behind your body. 4. Slowly lower your knee toward the floor until you feel a gentle stretch on the outside of your left / right thigh. If you do not feel a stretch and your knee will not fall farther, place the heel of your other foot on top of your knee and pull your knee down toward the floor with your foot. 5. Hold this position for __________ seconds. 6. Slowly return to the starting position. Repeat __________ times. Complete this exercise __________ times a day. Strengthening exercises These exercises build strength and endurance in your hip and pelvis. Endurance is the ability to use your muscles for a long time, even after they get tired. Bridge This exercise strengthens the muscles that move your thigh backward (hip extensors). 1. Lie on your back on a firm surface with your knees bent and your feet flat on the floor. 2. Tighten your buttocks muscles and lift your buttocks off the floor until your trunk is  level with your thighs. ? Do not arch your back. ? You should feel the muscles working in your buttocks and the back of your thighs. If you do not feel these muscles, slide your feet 1-2 inches (2.5-5 cm) farther away from your buttocks. ? If this exercise is too easy, try doing it with your arms crossed over your chest. 3. Hold this position for __________ seconds. 4. Slowly lower your hips to the starting position. 5. Let your muscles relax completely after each repetition. Repeat __________ times. Complete this exercise __________ times a day. Squats This exercise strengthens the muscles in front of your thigh and knee (quadriceps). 1. Stand in front of a table, with your feet and knees pointing straight ahead. You may rest your hands on the table for balance but not for support. 2. Slowly bend your knees and lower your hips like you are going to sit in a chair. ? Keep your weight over your heels, not over your toes. ? Keep your lower legs upright so they are parallel with the table legs. ? Do not let your hips go lower than your knees. ? Do not bend lower than told by your health care provider. ? If your hip pain increases, do not bend as low. 3. Hold the squat position for __________ seconds. 4. Slowly push with your legs to return to standing. Do not use your hands to pull yourself to standing. Repeat __________ times. Complete this exercise  __________ times a day. Hip hike 1. Stand sideways on a bottom step. Stand on your left / right leg with your other foot unsupported next to the step. You can hold on to the railing or wall for balance if needed. 2. Keep your knees straight and your torso square. Then lift your left / right hip up toward the ceiling. 3. Hold this position for __________ seconds. 4. Slowly let your left / right hip lower toward the floor, past the starting position. Your foot should get closer to the floor. Do not lean or bend your knees. Repeat __________ times.  Complete this exercise __________ times a day. Single leg stand 1. Without shoes, stand near a railing or in a doorway. You may hold on to the railing or door frame as needed for balance. 2. Squeeze your left / right buttock muscles, then lift up your other foot. ? Do not let your left / right hip push out to the side. ? It is helpful to stand in front of a mirror for this exercise so you can watch your hip. 3. Hold this position for __________ seconds. Repeat __________ times. Complete this exercise __________ times a day. This information is not intended to replace advice given to you by your health care provider. Make sure you discuss any questions you have with your health care provider. Document Released: 11/26/2004 Document Revised: 02/13/2019 Document Reviewed: 02/13/2019 Elsevier Patient Education  2020 [employer].

## 2019-07-22 ENCOUNTER — Other Ambulatory Visit: Payer: Self-pay | Admitting: Psychiatry

## 2019-07-22 DIAGNOSIS — F3130 Bipolar disorder, current episode depressed, mild or moderate severity, unspecified: Secondary | ICD-10-CM

## 2019-08-02 ENCOUNTER — Other Ambulatory Visit: Payer: Self-pay

## 2019-08-02 ENCOUNTER — Telehealth: Payer: Self-pay

## 2019-08-02 ENCOUNTER — Ambulatory Visit (INDEPENDENT_AMBULATORY_CARE_PROVIDER_SITE_OTHER): Payer: Medicare Other | Admitting: Licensed Clinical Social Worker

## 2019-08-02 ENCOUNTER — Encounter: Payer: Self-pay | Admitting: Licensed Clinical Social Worker

## 2019-08-02 DIAGNOSIS — F3130 Bipolar disorder, current episode depressed, mild or moderate severity, unspecified: Secondary | ICD-10-CM

## 2019-08-02 NOTE — Telephone Encounter (Signed)
pt called states she had a bad dream last night and she wanted you to know .  she wonders if the medication is causing it.  medication might need to be changed.

## 2019-08-02 NOTE — Telephone Encounter (Signed)
Returned call to patient.  She reports she had nightmares, happened only once last night.  She is worried his medications are causing it.  Discussed with her that we do not have to change any medications since it happened only once.  Advised her to keep a log of her sleep and her nightmares and bring it to the session when she returns for a visit in 10 days.  She will continue to work with her therapist

## 2019-08-02 NOTE — Progress Notes (Signed)
Virtual Visit via Video Note  I connected with Kendra [name] on 08/02/19 at 10:00 AM EDT by a video enabled telemedicine application and verified that I am speaking with the correct person using two identifiers.   I discussed the limitations of evaluation and management by telemedicine and the availability of in person appointments. The patient expressed understanding and agreed to proceed.  I discussed the assessment and treatment plan with the patient. The patient was provided an opportunity to ask questions and all were answered. The patient agreed with the plan and demonstrated an understanding of the instructions.   The patient was advised to call back or seek an in-person evaluation if the symptoms worsen or if the condition fails to improve as anticipated.  I provided 60 minutes of non-face-to-face time during this encounter.   Alden Hipp, LCSW    THERAPIST PROGRESS NOTE  Session Time: 1000  Participation Level: Active  Behavioral Response: NeatAlertAnxious  Type of Therapy: Individual Therapy  Treatment Goals addressed: Anxiety  Interventions: CBT and Supportive  Summary: Kendra [name] is a 73 y.o. female who presents with continued symptoms related to her diagnosis. Kendra [name] reports doing well since our last session. Kendra [name] reported she has been utilizing CBT skills to manage her anxiety, which has been working for most instances. She reports being bothered by a dream she had last night about dog heads that "were not attached to their bodies." We discussed that, at times, medications can cause individuals to have more vivid dreams, and often they do not have to do with anything else going on in the person's life. Kendra [name] was able to understand this idea, but asked how she could manage her anxiety about the dreams following waking up. LCSW encouraged Kendra [name] to utilize the same CBT skills she's utilizing in other areas, and to talk herself through it while making the statement that  it is not real, and is just a dream. Kendra [name] expressed understanding and agreement with this information. LCSW also gave another suggestion of writing the dreams down if she is unable to return to sleep, in order to tell her brain she will think about it tomorrow. Kendra [name] was also in agreement with this information.   Suicidal/Homicidal: No  Therapist Response: Kendra [name] continues to work towards her tx goals but has not yet reached them. We will continue to work on emotional regulation skills and improving CBT skills moving forward.   Plan: Return again in 2 weeks.  Diagnosis: Axis I: Bipolar 1 Disorder    Axis II: No diagnosis    Alden Hipp, LCSW 08/02/2019

## 2019-08-07 ENCOUNTER — Telehealth: Payer: Self-pay

## 2019-08-07 NOTE — Telephone Encounter (Signed)
Please let her know she is not on wellbutrin /bupropion

## 2019-08-07 NOTE — Telephone Encounter (Signed)
Tashawna states she confurssed about her medication,   she was on wonder if she still suppose to take the bupropion

## 2019-08-14 ENCOUNTER — Encounter: Payer: Self-pay | Admitting: Psychiatry

## 2019-08-14 ENCOUNTER — Ambulatory Visit (INDEPENDENT_AMBULATORY_CARE_PROVIDER_SITE_OTHER): Payer: Medicare Other | Admitting: Psychiatry

## 2019-08-14 ENCOUNTER — Other Ambulatory Visit: Payer: Self-pay

## 2019-08-14 ENCOUNTER — Telehealth: Payer: Self-pay

## 2019-08-14 DIAGNOSIS — F09 Unspecified mental disorder due to known physiological condition: Secondary | ICD-10-CM

## 2019-08-14 DIAGNOSIS — F5105 Insomnia due to other mental disorder: Secondary | ICD-10-CM

## 2019-08-14 DIAGNOSIS — F411 Generalized anxiety disorder: Secondary | ICD-10-CM | POA: Diagnosis not present

## 2019-08-14 DIAGNOSIS — F3130 Bipolar disorder, current episode depressed, mild or moderate severity, unspecified: Secondary | ICD-10-CM | POA: Diagnosis not present

## 2019-08-14 DIAGNOSIS — F1021 Alcohol dependence, in remission: Secondary | ICD-10-CM

## 2019-08-14 MED ORDER — FLUOXETINE HCL 60 MG PO TABS: 60.0000 mg | ORAL_TABLET | Freq: Every day | ORAL | 1 refills | Status: DC

## 2019-08-14 MED ORDER — RISPERIDONE 3 MG PO TABS: 1.5000 mg | ORAL_TABLET | Freq: Two times a day (BID) | ORAL | 1 refills | Status: DC

## 2019-08-14 NOTE — Telephone Encounter (Signed)
per dr.  Shea Evans mail copy of medication list.   List mailed out today.

## 2019-08-14 NOTE — Progress Notes (Signed)
Virtual Visit via Video Note  I connected with Kendra [name] on 08/14/19 at 10:30 AM EDT by a video enabled telemedicine application and verified that I am speaking with the correct person using two identifiers.   I discussed the limitations of evaluation and management by telemedicine and the availability of in person appointments. The patient expressed understanding and agreed to proceed.   I discussed the assessment and treatment plan with the patient. The patient was provided an opportunity to ask questions and all were answered. The patient agreed with the plan and demonstrated an understanding of the instructions.   The patient was advised to call back or seek an in-person evaluation if the symptoms worsen or if the condition fails to improve as anticipated.   Keuka Park MD OP Progress Note  08/14/2019 12:36 PM Kendra [name]  MRN:  [id number]  Chief Complaint:  Chief Complaint    Follow-up     HPI: Kendra [name] is a 73 year old Caucasian female, married, lives in [place], has a history of bipolar disorder, GAD, insomnia, cognitive disorder, multifocal stage I right breast cancer status post treatment-lumpectomy, ovarian cyst removal was evaluated by telemedicine today.  Patient today reports she is currently doing okay on the current medication regimen.  She does feel sad about her husband going out on a fishing trip for a week.  She reports she does have some support during that time and she can reach out to her friends if she needs help.  She reports sleep is good on the risperidone.  Patient denies any suicidality, homicidality or perceptual disturbances.  Patient appeared to be alert, oriented to person place and situation.  Patient continues to appear confused about some of her medications.  Discussed with her that she is only on Prozac and risperidone and not on Wellbutrin.  Discussed with her that a copy of her medication list can be mailed to her mailing address today.  Patient  denies any other concerns today.  She continues to be motivated to keep her appointments with Ms. Alden Hipp. Visit Diagnosis:    ICD-10-CM   1. Bipolar I disorder, most recent episode depressed (HCC)  F31.30 FLUoxetine HCl 60 MG TABS    risperiDONE (RISPERDAL) 3 MG tablet   stable  2. GAD (generalized anxiety disorder)  F41.1 FLUoxetine HCl 60 MG TABS    risperiDONE (RISPERDAL) 3 MG tablet  3. Insomnia due to mental condition  F51.05 FLUoxetine HCl 60 MG TABS  4. Mild cognitive disorder  F09   5. Alcohol use disorder, moderate, in sustained remission ([place])  F10.21     Past Psychiatric History: I have reviewed past psychiatric history from my progress note on 03/01/2019.  Past trials of Prozac, risperidone, Seroquel, Ativan.  Past Medical History:  Past Medical History:  Diagnosis Date  . Anxiety   . Breast cancer ([place]) 03/2018   right breast cancer at 11:00 and 1:00  . Complication of anesthesia   . Depression   . Family history of breast cancer 03/24/2018  . Family history of lung cancer 03/24/2018  . History of alcohol dependence ([place]) 2007   no ETOH; resolved since 2007  . History of psychosis 2014   due to sleep disturbance  . Hypertension   . Personal history of radiation therapy 06/2018-07/2018   right breast ca  . PONV (postoperative nausea and vomiting)     Past Surgical History:  Procedure Laterality Date  . BREAST BIOPSY Right 03/14/2018   11:00 DCIS and invasive ductal carcinoma  .  BREAST BIOPSY Right 03/14/2018   1:00 Invasive ductal carcinoma  . BREAST LUMPECTOMY Right 04/27/2018   lumpectomy of 11 and 1:00 cancers, clear margins, negative LN  . BREAST LUMPECTOMY WITH RADIOACTIVE SEED AND SENTINEL LYMPH NODE BIOPSY Right 04/27/2018   Procedure: RIGHT BREAST LUMPECTOMY WITH RADIOACTIVE SEED X 2 AND RIGHT SENTINEL LYMPH NODE BIOPSY;  Surgeon: [name], MD;  Location: [facility];  Service: General;  Laterality: Right;  . LAPAROTOMY N/A  02/10/2018   Procedure: EXPLORATORY LAPAROTOMY;  Surgeon: Isabel Caprice, MD;  Location: [facility];  Service: Gynecology;  Laterality: N/A;  . MASS EXCISION  01/2018   abdominal  . OVARIAN CYST REMOVAL    . SALPINGOOPHORECTOMY Bilateral 02/10/2018   Procedure: BILATERAL SALPINGO OOPHORECTOMY; PERITONEAL WASHINGS;  Surgeon: Isabel Caprice, MD;  Location: [facility];  Service: Gynecology;  Laterality: Bilateral;  . TONSILLECTOMY    . TONSILLECTOMY AND ADENOIDECTOMY  1953    Family Psychiatric History: I have reviewed family psychiatric history from my progress note on 03/01/2019.  Family History:  Family History  Problem Relation Age of Onset  . Diabetes Mother   . Breast cancer Mother 53  . Hypertension Mother   . Lung cancer Father        asbestos exposure  . Heart disease Brother 9  . Breast cancer Cousin 35       paternal cousin  . Heart disease Paternal Grandmother 45  . Heart disease Paternal Grandfather     Social History: I have reviewed social history from my progress note on 03/01/2019. Social History   Socioeconomic History  . Marital status: Married    Spouse name: robert  . Number of children: 0  . Years of education: Not on file  . Highest education level: Some college, no degree  Occupational History  . Occupation: retired Furniture conservator/restorer of education  Social Needs  . Financial resource strain: Not hard at all  . Food insecurity    Worry: Never true    Inability: Never true  . Transportation needs    Medical: No    Non-medical: No  Tobacco Use  . Smoking status: Never Smoker  . Smokeless tobacco: Never Used  Substance and Sexual Activity  . Alcohol use: Not Currently    Frequency: Never    Comment: alcohol dependence prior to 2007  . Drug use: Never  . Sexual activity: Yes    Partners: Male    Birth control/protection: Surgical  Lifestyle  . Physical activity    Days per week: 5 days    Minutes per session: 40 min  . Stress: Very much   Relationships  . Social Herbalist on phone: Not on file    Gets together: Not on file    Attends religious service: More than 4 times per year    Active member of club or organization: Yes    Attends meetings of clubs or organizations: 1 to 4 times per year    Relationship status: Married  Other Topics Concern  . Not on file  Social History Narrative  . Not on file    Allergies:  Allergies  Allergen Reactions  . Hydroxyzine Anaphylaxis    Tongue swollen  . Shrimp [Shellfish Allergy] Other (See Comments)    Food poisoning     Metabolic Disorder Labs: Lab Results  Component Value Date   HGBA1C 5.4 04/13/2019   Lab Results  Component Value Date   PROLACTIN 101.9 06/20/2015  Lab Results  Component Value Date   CHOL 173 04/13/2019   TRIG 78 04/13/2019   HDL 62 04/13/2019   LDLCALC 95 04/13/2019   LDLCALC 103 06/02/2017   Lab Results  Component Value Date   TSH 2.31 04/13/2019   TSH 2.543 07/25/2018    Therapeutic Level Labs: No results found for: LITHIUM No results found for: VALPROATE No components found for:  CBMZ  Current Medications: Current Outpatient Medications  Medication Sig Dispense Refill  . cyanocobalamin (,VITAMIN B-12,) 1000 MCG/ML injection Inject 1 mL (1,000 mcg total) into the muscle every 30 (thirty) days. 3 mL 3  . FLUoxetine HCl 60 MG TABS Take 60 mg by mouth daily. 90 tablet 1  . letrozole (FEMARA) 2.5 MG tablet Take 1 tablet (2.5 mg total) by mouth daily. 90 tablet 3  . lisinopril-hydrochlorothiazide (PRINZIDE,ZESTORETIC) 20-12.5 MG tablet Take 1 tablet by mouth daily. 90 tablet 3  . risperiDONE (RISPERDAL) 3 MG tablet Take 0.5 tablets (1.5 mg total) by mouth 2 (two) times daily. 90 tablet 1  . Vitamin D, Ergocalciferol, (DRISDOL) 1.25 MG (50000 UT) CAPS capsule      Current Facility-Administered Medications  Medication Dose Route Frequency Provider Last Rate Last Dose  . lidocaine (PF) (XYLOCAINE) 1 % injection 4 mL  4 mL  Intradermal Once Virginia Crews, MD      . lidocaine (PF) (XYLOCAINE) 1 % injection 4 mL  4 mL Intradermal Once Virginia Crews, MD      . methylPREDNISolone acetate (DEPO-MEDROL) injection 40 mg  40 mg Intramuscular Once Virginia Crews, MD      . methylPREDNISolone acetate (DEPO-MEDROL) injection 40 mg  40 mg Intramuscular Once Brita Romp Dionne Bucy, MD         Musculoskeletal: Strength & Muscle Tone: [place]: UTA Patient leans: N/A  Psychiatric Specialty Exam: Review of Systems  Psychiatric/Behavioral: The patient is nervous/anxious.   All other systems reviewed and are negative.   There were no vitals taken for this visit.There is no height or weight on file to calculate BMI.  General Appearance: Casual  Eye Contact:  Fair  Speech:  Normal Rate  Volume:  Normal  Mood:  Anxious  Affect:  Congruent  Thought Process:  Goal Directed and Descriptions of Associations: Intact  Orientation:  Full (Time, Place, and Person)  Thought Content: Logical   Suicidal Thoughts:  No  Homicidal Thoughts:  No  Memory:  Immediate;   Fair Recent;   Fair Remote;   Fair  Judgement:  Fair  Insight:  Fair  Psychomotor Activity:  Normal  Concentration:  Concentration: Fair and Attention Span: Fair  Recall:  [employer] of Knowledge: Fair  Language: Fair  Akathisia:  No  Handed:  Right  AIMS (if indicated): Denies tremors, rigidity  Assets:  Communication Skills Desire for Improvement Housing Social Support  ADL's:  Intact  Cognition: WNL  Sleep:  Fair   Screenings: PHQ2-9     Office Visit from 11/24/2018 in [facility]  PHQ-2 Total Score  0  PHQ-9 Total Score  3       Assessment and Plan: Kendra [name] is a 73 year old Caucasian female, married, lives in [place], has a history of bipolar disorder, GAD, insomnia, cognitive disorder, multifocal stage I right breast cancer status post lumpectomy, ovarian cyst removal was evaluated by telemedicine  today.  Patient is biologically predisposed given her multiple health problems.  She also has psychosocial stressors of the recent COVID-19 pandemic.  Patient is currently making progress.  Plan Bipolar disorder-stable Risperidone 3 mg p.o. daily in divided dosage Prozac 60 mg p.o. daily  GAD-stable Continue psychotherapy sessions with Ms. Cecilie Lowers Continue Prozac.  Insomnia-stable Melatonin 10 mg p.o. nightly  For cognitive changes-she will continue to work with her neurologist.  I have printed out a list of her medications today and will mail it to her.  Also went through all her medications that she is on for her mental health with her today.  Follow-up in clinic in 6 weeks or sooner if needed.  December 7 at 11 AM  I have spent atleast 15 minutes non face to face with patient today. More than 50 % of the time was spent for psychoeducation and supportive psychotherapy and care coordination. This note was generated in part or whole with voice recognition software. Voice recognition is usually quite accurate but there are transcription errors that can and very often do occur. I apologize for any typographical errors that were not detected and corrected.         Ursula Alert, MD 08/14/2019, 12:36 PM

## 2019-08-18 ENCOUNTER — Encounter: Payer: Self-pay | Admitting: Licensed Clinical Social Worker

## 2019-08-18 ENCOUNTER — Ambulatory Visit (INDEPENDENT_AMBULATORY_CARE_PROVIDER_SITE_OTHER): Payer: Medicare Other | Admitting: Licensed Clinical Social Worker

## 2019-08-18 ENCOUNTER — Other Ambulatory Visit: Payer: Self-pay

## 2019-08-18 DIAGNOSIS — F3130 Bipolar disorder, current episode depressed, mild or moderate severity, unspecified: Secondary | ICD-10-CM

## 2019-08-18 NOTE — Progress Notes (Signed)
  Virtual Visit via Video Note  I connected with Kendra [name] on 08/18/19 at  9:00 AM EDT by a video enabled telemedicine application and verified that I am speaking with the correct person using two identifiers.   I discussed the limitations of evaluation and management by telemedicine and the availability of in person appointments. The patient expressed understanding and agreed to proceed.  I discussed the assessment and treatment plan with the patient. The patient was provided an opportunity to ask questions and all were answered. The patient agreed with the plan and demonstrated an understanding of the instructions.   The patient was advised to call back or seek an in-person evaluation if the symptoms worsen or if the condition fails to improve as anticipated.  I provided 60 minutes of non-face-to-face time during this encounter.   Alden Hipp, LCSW   THERAPIST PROGRESS NOTE  Session Time: 0900  Participation Level: Active  Behavioral Response: NeatAlertAnxious  Type of Therapy: Individual Therapy  Treatment Goals addressed: Anxiety  Interventions: CBT  Summary: Kendra [name] is a 73 y.o. female who presents with continued symptoms related to her diagnosis. Kendra [name] reports doing well since our last session. She reports her anxiety has been manageable and she has been able to challenge all negative thoughts that come up. Kendra [name] reports she has continued going on outings with her friends in an effort to be adventurous. LCSW validated these feelings and encouraged Kendra [name] to recognize the progress and hard work she has put in to get her to a place where she is not nervous to venture out of the house. Kendra [name] was able to recognize this progress as well and expressed agreement with LCSW's notion. Kendra [name] reported no negative events since our last session, and reported she has felt very in control over her feelings and emotions. LCSW validated this idea and celebrated Kendra [name]'s progress.    Suicidal/Homicidal: No  Therapist Response: Kendra [name] continues to work towards her tx goals but has not yet reached them. We will continue to work on emotional regulation skills moving forward and improving CBT skills to manage anxiety.   Plan: Return again in 4 weeks.  Diagnosis: Axis I: Bipolar 1 Disorder    Axis II: No diagnosis    Alden Hipp, LCSW 08/18/2019

## 2019-08-21 ENCOUNTER — Ambulatory Visit (INDEPENDENT_AMBULATORY_CARE_PROVIDER_SITE_OTHER): Payer: Medicare Other | Admitting: Family Medicine

## 2019-08-21 ENCOUNTER — Other Ambulatory Visit: Payer: Self-pay

## 2019-08-21 VITALS — BP 138/79 | HR 71 | Temp 96.9°F | Wt 191.0 lb

## 2019-08-21 DIAGNOSIS — M7061 Trochanteric bursitis, right hip: Secondary | ICD-10-CM | POA: Diagnosis not present

## 2019-08-21 DIAGNOSIS — M7062 Trochanteric bursitis, left hip: Secondary | ICD-10-CM | POA: Diagnosis not present

## 2019-08-21 NOTE — Patient Instructions (Signed)
Get a massage that focuses on Gluteus muscles and IT band.  Let them know you have struggled with Hip bursitis of greater trochanter.  Hip Bursitis  Hip bursitis is inflammation of a fluid-filled sac (bursa) in the hip joint. The bursa prevents the bones in the hip joint from rubbing against each other. Hip bursitis can cause mild to moderate pain, and symptoms often come and go over time. What are the causes? This condition may be caused by:  Injury to the hip.  Overuse of the muscles that surround the hip joint.  Previous injury or surgery of the hip.  Arthritis or gout.  Diabetes.  Thyroid disease.  Infection. In some cases, the cause may not be known. What are the signs or symptoms? Symptoms of this condition include:  Mild or moderate pain in the hip area. Pain may get worse with movement.  Tenderness and swelling of the hip, especially on the outer side of the hip.  In rare cases, the bursa may become infected. This may cause a fever, as well as warmth and redness in the area. Symptoms may come and go. How is this diagnosed? This condition may be diagnosed based on:  A physical exam.  Your medical history.  X-rays.  Removal of fluid from your inflamed bursa for testing (biopsy). You may be sent to a health care provider who specializes in bone diseases (orthopedist) or a provider who specializes in joint inflammation (rheumatologist). How is this treated? This condition is treated by resting, icing, applying pressure (compression), and raising (elevating) the injured area. This is called RICE treatment. In some cases, this may be enough to make your symptoms go away. Treatment may also include:  Using crutches.  Draining fluid out of the bursa to help relieve swelling.  Injecting medicine that helps to reduce inflammation (cortisone).  Additional medicines if the bursa is infected. Follow these instructions at home: Managing pain, stiffness, and swelling    If directed, put ice on the painful area. ? Put ice in a plastic bag. ? Place a towel between your skin and the bag. ? Leave the ice on for 20 minutes, 2-3 times a day. ? Raise (elevate) your hip above the level of your heart as much as you can without pain. To do this, try putting a pillow under your hips while you lie down. Activity  Return to your normal activities as told by your health care provider. Ask your health care provider what activities are safe for you.  Rest and protect your hip as much as possible until your pain and swelling get better. General instructions  Take over-the-counter and prescription medicines only as told by your health care provider.  Wear compression wraps only as told by your health care provider.  Do not use your hip to support your body weight until your health care provider says that you can. Use crutches as told by your health care provider.  Gently massage and stretch your injured area as often as is comfortable.  Keep all follow-up visits as told by your health care provider. This is important. How is this prevented?  Exercise regularly, as told by your health care provider.  Warm up and stretch before being active.  Cool down and stretch after being active.  If an activity irritates your hip or causes pain, avoid the activity as much as possible.  Avoid sitting down for long periods at a time. Contact a health care provider if you:  Have a fever.  Develop new symptoms.  Have difficulty walking or doing everyday activities.  Have pain that gets worse or does not get better with medicine.  Develop red skin or a feeling of warmth in your hip area. Get help right away if you:  Cannot move your hip.  Have severe pain. Summary  Hip bursitis is inflammation of a fluid-filled sac (bursa) in the hip joint.  Hip bursitis can cause mild to moderate pain, and symptoms often come and go over time.  This condition is treated with  rest, ice, compression, elevation, and medicines. This information is not intended to replace advice given to you by your health care provider. Make sure you discuss any questions you have with your health care provider. Document Released: 04/10/2002 Document Revised: 06/27/2018 Document Reviewed: 06/27/2018 Elsevier Patient Education  2020 [employer].

## 2019-08-21 NOTE — Progress Notes (Signed)
Patient: Kendra [name] Female    DOB: 1945-12-11   73 y.o.   MRN: [id number] Visit Date: 08/21/2019  Today's Provider: Lavon Paganini, MD   Chief Complaint  Patient presents with  . Hip Pain    Bilateral; Right worse than left.   Subjective:     Hip Pain  The incident occurred more than 1 week ago (Recurrent; started about two weeks ago.). There was no injury mechanism. The pain is present in the right hip and right knee. The quality of the pain is described as aching and burning. Associated symptoms include muscle weakness. Pertinent negatives include no inability to bear weight, loss of motion, loss of sensation, numbness or tingling. The symptoms are aggravated by weight bearing, palpation and movement. She has tried NSAIDs for the symptoms. The treatment provided mild relief.    Trying to do HEP, but struggling  With flexibility and bridge position.  Bursa injections helped last time, but recurred about 1 week ago. NSAIDs are helping some. Worse with sleeping on that side and going up stairs.  Allergies  Allergen Reactions  . Hydroxyzine Anaphylaxis    Tongue swollen  . Shrimp [Shellfish Allergy] Other (See Comments)    Food poisoning      Current Outpatient Medications:  .  cyanocobalamin (,VITAMIN B-12,) 1000 MCG/ML injection, Inject 1 mL (1,000 mcg total) into the muscle every 30 (thirty) days., Disp: 3 mL, Rfl: 3 .  FLUoxetine HCl 60 MG TABS, Take 60 mg by mouth daily., Disp: 90 tablet, Rfl: 1 .  letrozole (FEMARA) 2.5 MG tablet, Take 1 tablet (2.5 mg total) by mouth daily., Disp: 90 tablet, Rfl: 3 .  lisinopril-hydrochlorothiazide (PRINZIDE,ZESTORETIC) 20-12.5 MG tablet, Take 1 tablet by mouth daily., Disp: 90 tablet, Rfl: 3 .  risperiDONE (RISPERDAL) 3 MG tablet, Take 0.5 tablets (1.5 mg total) by mouth 2 (two) times daily., Disp: 90 tablet, Rfl: 1 .  Vitamin D, Ergocalciferol, (DRISDOL) 1.25 MG (50000 UT) CAPS capsule, , Disp: , Rfl:   Current  Facility-Administered Medications:  .  lidocaine (PF) (XYLOCAINE) 1 % injection 4 mL, 4 mL, Intradermal, Once, Khia Dieterich, Dionne Bucy, MD .  lidocaine (PF) (XYLOCAINE) 1 % injection 4 mL, 4 mL, Intradermal, Once, Nassim Cosma, Dionne Bucy, MD .  methylPREDNISolone acetate (DEPO-MEDROL) injection 40 mg, 40 mg, Intramuscular, Once, Adin Lariccia, Dionne Bucy, MD .  methylPREDNISolone acetate (DEPO-MEDROL) injection 40 mg, 40 mg, Intramuscular, Once, Daune Divirgilio, Dionne Bucy, MD  Review of Systems  Constitutional: Negative.   Musculoskeletal: Positive for arthralgias and myalgias. Negative for back pain, gait problem, joint swelling, neck pain and neck stiffness.  Neurological: Negative for dizziness, tingling, numbness and headaches.    Social History   Tobacco Use  . Smoking status: Never Smoker  . Smokeless tobacco: Never Used  Substance Use Topics  . Alcohol use: Not Currently    Frequency: Never    Comment: alcohol dependence prior to 2007      Objective:   BP 138/79 (BP Location: Left Arm, Patient Position: Sitting, Cuff Size: Large)   Pulse 71   Temp (!) 96.9 F (36.1 C) (Temporal)   Wt 191 lb (86.6 kg)   BMI 30.83 kg/m  Vitals:   08/21/19 1536  BP: 138/79  Pulse: 71  Temp: (!) 96.9 F (36.1 C)  TempSrc: Temporal  Weight: 191 lb (86.6 kg)  Body mass index is 30.83 kg/m.   Physical Exam Vitals signs reviewed.  Constitutional:      General: She is  not in acute distress.    Appearance: Normal appearance.  HENT:     Head: Normocephalic and atraumatic.  Eyes:     Conjunctiva/sclera: Conjunctivae normal.  Cardiovascular:     Rate and Rhythm: Normal rate and regular rhythm.     Pulses: Normal pulses.     Heart sounds: Normal heart sounds. No murmur.  Pulmonary:     Effort: Pulmonary effort is normal. No respiratory distress.     Breath sounds: Normal breath sounds. No wheezing or rales.  Musculoskeletal:     Right lower leg: No edema.     Left lower leg: No edema.      Comments: B/l hips: ROM intact. Strength Flexion: 5/5, Extension: 5/5, Abduction: 4/5, Adduction: 4/5 Pelvic alignment unremarkable to inspection and palpation. Standing hip rotation and gait without trendelenburg / unsteadiness. Greater trochanter with tenderness to palpation. No tenderness over piriformis and greater trochanter. No SI joint tenderness and normal minimal SI movement.   Skin:    General: Skin is warm and dry.     Capillary Refill: Capillary refill takes less than 2 seconds.     Findings: No rash.  Neurological:     Mental Status: She is alert and oriented to person, place, and time. Mental status is at baseline.     Sensory: No sensory deficit.     Gait: Gait abnormal (antalgic).  Psychiatric:        Mood and Affect: Mood normal.        Behavior: Behavior normal.     No results found for any visits on 08/21/19.     Assessment & Plan   1. Greater trochanteric bursitis of both hips - ongoing for several months -Did improve temporarily with corticosteroid injection at last visit, but this was only 1 month ago -Discussed hesitance to repeat corticosteroid injection so soon after the last 1 -Also discussed that this close to cataract surgery, steroids may lead to delaying her surgery -Patient agrees to formal physical therapy -Could consider Ortho referral in the future -Continue NSAIDs, icing -Advised trying massage therapy as well -Discussed return precautions - Ambulatory referral to Physical Therapy    Return if symptoms worsen or fail to improve.   The entirety of the information documented in the History of Present Illness, Review of Systems and Physical Exam were personally obtained by me. Portions of this information were initially documented by Ashley Royalty, CMA and reviewed by me for thoroughness and accuracy.    Lenix Benoist, Dionne Bucy, MD MPH [facility]

## 2019-08-23 ENCOUNTER — Encounter: Payer: Self-pay | Admitting: *Deleted

## 2019-08-23 ENCOUNTER — Other Ambulatory Visit: Payer: Self-pay

## 2019-08-24 ENCOUNTER — Encounter: Payer: Self-pay | Admitting: Family Medicine

## 2019-08-24 ENCOUNTER — Ambulatory Visit (INDEPENDENT_AMBULATORY_CARE_PROVIDER_SITE_OTHER): Payer: Medicare Other | Admitting: Family Medicine

## 2019-08-24 VITALS — BP 114/72 | HR 71 | Temp 97.1°F | Ht 66.0 in | Wt 189.0 lb

## 2019-08-24 DIAGNOSIS — F1021 Alcohol dependence, in remission: Secondary | ICD-10-CM

## 2019-08-24 DIAGNOSIS — E559 Vitamin D deficiency, unspecified: Secondary | ICD-10-CM | POA: Insufficient documentation

## 2019-08-24 DIAGNOSIS — Z0000 Encounter for general adult medical examination without abnormal findings: Secondary | ICD-10-CM | POA: Diagnosis not present

## 2019-08-24 DIAGNOSIS — F09 Unspecified mental disorder due to known physiological condition: Secondary | ICD-10-CM

## 2019-08-24 DIAGNOSIS — I10 Essential (primary) hypertension: Secondary | ICD-10-CM

## 2019-08-24 DIAGNOSIS — E538 Deficiency of other specified B group vitamins: Secondary | ICD-10-CM

## 2019-08-24 DIAGNOSIS — F3130 Bipolar disorder, current episode depressed, mild or moderate severity, unspecified: Secondary | ICD-10-CM

## 2019-08-24 DIAGNOSIS — F411 Generalized anxiety disorder: Secondary | ICD-10-CM

## 2019-08-24 DIAGNOSIS — F322 Major depressive disorder, single episode, severe without psychotic features: Secondary | ICD-10-CM

## 2019-08-24 DIAGNOSIS — C50411 Malignant neoplasm of upper-outer quadrant of right female breast: Secondary | ICD-10-CM

## 2019-08-24 DIAGNOSIS — Z170 Estrogen receptor positive status [ER+]: Secondary | ICD-10-CM

## 2019-08-24 MED ORDER — CYANOCOBALAMIN 1000 MCG/ML IJ SOLN: 1000.0000 ug | INTRAMUSCULAR | 3 refills | Status: DC

## 2019-08-24 MED ORDER — DOCUSATE SODIUM 100 MG PO CAPS: 100.0000 mg | ORAL_CAPSULE | Freq: Every day | ORAL | 3 refills | Status: DC

## 2019-08-24 MED ORDER — LISINOPRIL-HYDROCHLOROTHIAZIDE 20-12.5 MG PO TABS: 1.0000 | ORAL_TABLET | Freq: Every day | ORAL | 3 refills | Status: DC

## 2019-08-24 MED ORDER — NAPROXEN SODIUM 220 MG PO TABS: 220.0000 mg | ORAL_TABLET | Freq: Two times a day (BID) | ORAL | 1 refills | Status: DC | PRN

## 2019-08-24 NOTE — Assessment & Plan Note (Signed)
Followed by oncology No changes to current medications Follow-up visit with labs coming up next week

## 2019-08-24 NOTE — Assessment & Plan Note (Signed)
Followed by oncology and neurology On monthly B12 injections at home Refill of B12 injections sent to the pharmacy Recheck B12 level

## 2019-08-24 NOTE — Assessment & Plan Note (Signed)
Stable and well controlled No alcohol intake since 2007 

## 2019-08-24 NOTE — Assessment & Plan Note (Signed)
Followed by psychiatry Also seeing a therapist No changes to medications

## 2019-08-24 NOTE — Assessment & Plan Note (Signed)
Well controlled Continue current medications Recheck metabolic panel -is already ordered by oncology to be done next week F/u in 6 months

## 2019-08-24 NOTE — Assessment & Plan Note (Signed)
Followed by psychiatry Seeing a therapist as well No changes to medications

## 2019-08-24 NOTE — Progress Notes (Signed)
Patient: Kendra [name], Female    DOB: 1946-04-18, 73 y.o.   MRN: [id number] Visit Date: 08/24/2019  Today's Provider: Lavon Paganini, MD   Chief Complaint  Patient presents with  . Annual Exam   Subjective:     Complete Physical Kendra [name] is a 73 y.o. female. She feels well. She reports exercising daily. She reports she is sleeping fairly well.  -----------------------------------------------------------   Review of Systems  Constitutional: Negative.   HENT: Negative.   Eyes: Negative.   Respiratory: Positive for cough.   Cardiovascular: Negative.   Gastrointestinal: Negative.   Endocrine: Negative.   Genitourinary: Negative.   Musculoskeletal: Negative.   Skin: Negative.   Allergic/Immunologic: Positive for environmental allergies.  Neurological: Negative.   Hematological: Negative.   Psychiatric/Behavioral: Negative.     Social History   Socioeconomic History  . Marital status: Married    Spouse name: robert  . Number of children: 0  . Years of education: Not on file  . Highest education level: Some college, no degree  Occupational History  . Occupation: retired Furniture conservator/restorer of education  Social Needs  . Financial resource strain: Not hard at all  . Food insecurity    Worry: Never true    Inability: Never true  . Transportation needs    Medical: No    Non-medical: No  Tobacco Use  . Smoking status: Never Smoker  . Smokeless tobacco: Never Used  Substance and Sexual Activity  . Alcohol use: Not Currently    Frequency: Never    Comment: alcohol dependence prior to 2007  . Drug use: Never  . Sexual activity: Yes    Partners: Male    Birth control/protection: Surgical  Lifestyle  . Physical activity    Days per week: 5 days    Minutes per session: 40 min  . Stress: Very much  Relationships  . Social Herbalist on phone: Not on file    Gets together: Not on file    Attends religious service: More than  4 times per year    Active member of club or organization: Yes    Attends meetings of clubs or organizations: 1 to 4 times per year    Relationship status: Married  . Intimate partner violence    Fear of current or ex partner: No    Emotionally abused: No    Physically abused: No    Forced sexual activity: No  Other Topics Concern  . Not on file  Social History Narrative  . Not on file    Past Medical History:  Diagnosis Date  . Anxiety   . Breast cancer ([place]) 03/2018   right breast cancer at 11:00 and 1:00  . Bursitis    bilateral hips and knees  . Complication of anesthesia   . Depression   . Family history of breast cancer 03/24/2018  . Family history of lung cancer 03/24/2018  . History of alcohol dependence ([place]) 2007   no ETOH; resolved since 2007  . History of hiatal hernia   . History of psychosis 2014   due to sleep disturbance  . Hypertension   . Personal history of radiation therapy 06/2018-07/2018   right breast ca  . PONV (postoperative nausea and vomiting)      Patient Active Problem List   Diagnosis Date Noted  . Insomnia due to mental condition 05/31/2019  . Bipolar I disorder, most recent episode depressed ([place]) 05/31/2019  .  Mild cognitive disorder 05/31/2019  . Alcohol use disorder, moderate, in sustained remission ([place]) 05/31/2019  . Elevated tumor markers 05/27/2019  . GAD (generalized anxiety disorder) 04/10/2019  . Altered mental status 04/10/2019  . MDD (major depressive disorder), severe ([place]) 01/31/2019  . High serum carbohydrate antigen 19-9 (CA19-9) 12/01/2018  . Adjustment disorder with anxious mood 11/24/2018  . Essential hypertension 11/24/2018  . Screening for lipid disorders 11/24/2018  . Elevated LFTs 10/17/2018  . B12 deficiency 10/17/2018  . Incisional hernia, without obstruction or gangrene 09/12/2018  . Moderate recurrent major depression ([place]) 07/25/2018  . Genetic testing 04/07/2018  . Family history of breast cancer  03/24/2018  . Family history of lung cancer 03/24/2018  . Malignant neoplasm of upper-outer quadrant of right breast in female, estrogen receptor positive ([place]) 03/17/2018  . Malignant neoplasm of upper-inner quadrant of right breast in female, estrogen receptor positive ([place]) 03/17/2018  . History of alcohol dependence ([place]) 02/07/2018  . History of psychosis 02/07/2018  . History of insomnia 02/07/2018    Past Surgical History:  Procedure Laterality Date  . BREAST BIOPSY Right 03/14/2018   11:00 DCIS and invasive ductal carcinoma  . BREAST BIOPSY Right 03/14/2018   1:00 Invasive ductal carcinoma  . BREAST LUMPECTOMY Right 04/27/2018   lumpectomy of 11 and 1:00 cancers, clear margins, negative LN  . BREAST LUMPECTOMY WITH RADIOACTIVE SEED AND SENTINEL LYMPH NODE BIOPSY Right 04/27/2018   Procedure: RIGHT BREAST LUMPECTOMY WITH RADIOACTIVE SEED X 2 AND RIGHT SENTINEL LYMPH NODE BIOPSY;  Surgeon: [name], MD;  Location: [facility];  Service: General;  Laterality: Right;  . LAPAROTOMY N/A 02/10/2018   Procedure: EXPLORATORY LAPAROTOMY;  Surgeon: Isabel Caprice, MD;  Location: [facility];  Service: Gynecology;  Laterality: N/A;  . MASS EXCISION  01/2018   abdominal  . OVARIAN CYST REMOVAL    . SALPINGOOPHORECTOMY Bilateral 02/10/2018   Procedure: BILATERAL SALPINGO OOPHORECTOMY; PERITONEAL WASHINGS;  Surgeon: Isabel Caprice, MD;  Location: [facility];  Service: Gynecology;  Laterality: Bilateral;  . TONSILLECTOMY    . TONSILLECTOMY AND ADENOIDECTOMY  1953    Her family history includes Breast cancer (age of onset: 19) in her cousin; Breast cancer (age of onset: 35) in her mother; Diabetes in her mother; Heart disease in her paternal grandfather; Heart disease (age of onset: 25) in her brother; Heart disease (age of onset: 17) in her paternal grandmother; Hypertension in her mother; Lung cancer in her father.   Current Outpatient Medications:  .  cyanocobalamin  (,VITAMIN B-12,) 1000 MCG/ML injection, Inject 1 mL (1,000 mcg total) into the muscle every 30 (thirty) days., Disp: 3 mL, Rfl: 3 .  docusate sodium (COLACE) 100 MG capsule, Take 100 mg by mouth daily., Disp: , Rfl:  .  ERGOCALCIFEROL PO, Take 2,400 mg by mouth daily., Disp: , Rfl:  .  FLUoxetine HCl 60 MG TABS, Take 60 mg by mouth daily., Disp: 90 tablet, Rfl: 1 .  letrozole (FEMARA) 2.5 MG tablet, Take 1 tablet (2.5 mg total) by mouth daily., Disp: 90 tablet, Rfl: 3 .  lisinopril-hydrochlorothiazide (PRINZIDE,ZESTORETIC) 20-12.5 MG tablet, Take 1 tablet by mouth daily., Disp: 90 tablet, Rfl: 3 .  Melatonin 10 MG TABS, Take 30 mg by mouth at bedtime., Disp: , Rfl:  .  naproxen sodium (ALEVE) 220 MG tablet, Take 220 mg by mouth 2 (two) times daily as needed., Disp: , Rfl:  .  risperiDONE (RISPERDAL) 3 MG tablet, Take 0.5 tablets (1.5 mg total) by mouth  2 (two) times daily., Disp: 90 tablet, Rfl: 1  Patient Care Team: , Dionne Bucy, MD as PCP - General (Family Medicine) [name], MD as Consulting Physician (General Surgery) Gery Pray, MD as Consulting Physician (Radiation Oncology) Lequita Asal, MD as Consulting Physician (Hematology and Oncology)     Objective:    Vitals: BP 114/72 (BP Location: Left Arm, Patient Position: Sitting, Cuff Size: Large)   Pulse 71   Temp (!) 97.1 F (36.2 C) (Temporal)   Ht 5\' 6"  (1.676 m)   Wt 189 lb (85.7 kg)   BMI 30.51 kg/m   Physical Exam Vitals signs reviewed.  Constitutional:      General: She is not in acute distress.    Appearance: Normal appearance. She is well-developed. She is not diaphoretic.  HENT:     Head: Normocephalic and atraumatic.     Right Ear: Tympanic membrane, ear canal and external ear normal.     Left Ear: Tympanic membrane, ear canal and external ear normal.  Eyes:     General: No scleral icterus.    Conjunctiva/sclera: Conjunctivae normal.     Pupils: Pupils are equal, round, and reactive to  light.  Neck:     Musculoskeletal: Neck supple.     Thyroid: No thyromegaly.  Cardiovascular:     Rate and Rhythm: Normal rate and regular rhythm.     Pulses: Normal pulses.     Heart sounds: Normal heart sounds. No murmur.  Pulmonary:     Effort: Pulmonary effort is normal. No respiratory distress.     Breath sounds: Normal breath sounds. No wheezing or rales.  Abdominal:     General: There is no distension.     Palpations: Abdomen is soft.     Tenderness: There is no abdominal tenderness.  Musculoskeletal:        General: No deformity.     Right lower leg: No edema.     Left lower leg: No edema.  Lymphadenopathy:     Cervical: No cervical adenopathy.  Skin:    General: Skin is warm and dry.     Capillary Refill: Capillary refill takes less than 2 seconds.     Findings: No rash.  Neurological:     Mental Status: She is alert and oriented to person, place, and time. Mental status is at baseline.  Psychiatric:        Mood and Affect: Mood normal.        Behavior: Behavior normal.        Thought Content: Thought content normal.     Activities of Daily Living In your present state of health, do you have any difficulty performing the following activities: 04/10/2019 11/24/2018  Hearing? N N  Vision? N Y  Comment - wear contacts  Difficulty concentrating or making decisions? N N  Walking or climbing stairs? N Y  Comment - Sometimes per patient  Dressing or bathing? N N  Doing errands, shopping? - N  Some recent data might be hidden    Fall Risk Assessment Fall Risk  03/15/2019 11/24/2018 09/12/2018  Falls in the past year? 0 0 0  Number falls in past yr: 0 0 -  Injury with Fall? - 0 -  Follow up Falls evaluation completed - -     Depression Screen PHQ 2/9 Scores 11/24/2018  PHQ - 2 Score 0  PHQ- 9 Score 3    6CIT Screen 08/24/2019  What Year? 0 points  What month? 0 points  What  time? 0 points  Count back from 20 0 points  Months in reverse 2 points  Repeat  phrase 6 points  Total Score 8       Assessment & Plan:    Annual Physical Reviewed patient's Family Medical History Reviewed and updated list of patient's medical providers Assessment of cognitive impairment was done Assessed patient's functional ability Established a written schedule for health screening [place] Completed and Reviewed  Exercise Activities and Dietary recommendations Goals   None     Immunization History  Administered Date(s) Administered  . Fluad Quad(high Dose 65+) 07/19/2019    Health Maintenance  Topic Date Due  . Samul Dada  08/20/1966  . PNA vac Low Risk Adult (1 of 2 - PCV13) 08/20/2012  . Fecal DNA (Cologuard)  06/28/2020  . MAMMOGRAM  06/11/2021  . INFLUENZA VACCINE  Completed  . DEXA SCAN  Completed  . Hepatitis C Screening  Completed     Discussed health benefits of physical activity, and encouraged her to engage in regular exercise appropriate for her age and condition.    Patient declines tetanus and pneumococcal vaccinations today ------------------------------------------------------------------------------------------------------------  Problem List Items Addressed This Visit      Cardiovascular and Mediastinum   Essential hypertension - Primary    Well controlled Continue current medications Recheck metabolic panel -is already ordered by oncology to be done next week F/u in 6 months       Relevant Medications   lisinopril-hydrochlorothiazide (ZESTORETIC) 20-12.5 MG tablet     Nervous and Auditory   Mild cognitive disorder    Followed by neurology and psychiatry Seems to be at baseline today        Other   History of alcohol dependence (HCC)    Stable and well-controlled No alcohol intake since 2007      Malignant neoplasm of upper-outer quadrant of right breast in female, estrogen receptor positive ([place])    Followed by oncology No changes to current medications Follow-up visit with  labs coming up next week      B12 deficiency    Followed by oncology and neurology On monthly B12 injections at home Refill of B12 injections sent to the pharmacy Recheck B12 level      Relevant Orders   B12   MDD (major depressive disorder), severe ([facility])    Followed by psychiatry Seeing a therapist No changes to medications      GAD (generalized anxiety disorder)    Followed by psychiatry Seeing a therapist as well No changes to medications      Bipolar I disorder, most recent episode depressed ([place])    Followed by psychiatry Also seeing a therapist No changes to medications      Avitaminosis D    Continue OTC supplement Recheck level      Relevant Orders   VITAMIN D 25 Hydroxy (Vit-D Deficiency, Fractures)    Other Visit Diagnoses    Encounter for annual physical exam       Medicare annual wellness visit, subsequent           Return in about 6 months (around 02/22/2020) for chronic disease f/u.   The entirety of the information documented in the History of Present Illness, Review of Systems and Physical Exam were personally obtained by me. Portions of this information were initially documented by Ashley Royalty, CMA and reviewed by me for thoroughness and accuracy.    , Dionne Bucy, MD MPH [facility]

## 2019-08-24 NOTE — Patient Instructions (Signed)
Preventive Care 72 Years and Older, Female Preventive care refers to lifestyle choices and visits with your health care provider that can promote health and wellness. This includes:  A yearly physical exam. This is also called an annual well check.  Regular dental and eye exams.  Immunizations.  Screening for certain conditions.  Healthy lifestyle choices, such as diet and exercise. What can I expect for my preventive care visit? Physical exam Your health care provider will check:  Height and weight. These may be used to calculate body mass index (BMI), which is a measurement that tells if you are at a healthy weight.  Heart rate and blood pressure.  Your skin for abnormal spots. Counseling Your health care provider may ask you questions about:  Alcohol, tobacco, and drug use.  Emotional well-being.  Home and relationship well-being.  Sexual activity.  Eating habits.  History of falls.  Memory and ability to understand (cognition).  Work and work Statistician.  Pregnancy and menstrual history. What immunizations do I need?  Influenza (flu) vaccine  This is recommended every year. Tetanus, diphtheria, and pertussis (Tdap) vaccine  You may need a Td booster every 10 years. Varicella (chickenpox) vaccine  You may need this vaccine if you have not already been vaccinated. Zoster (shingles) vaccine  You may need this after age 72. Pneumococcal conjugate (PCV13) vaccine  One dose is recommended after age 72. Pneumococcal polysaccharide (PPSV23) vaccine  One dose is recommended after age 72. Measles, mumps, and rubella (MMR) vaccine  You may need at least one dose of MMR if you were born in 1957 or later. You may also need a second dose. Meningococcal conjugate (MenACWY) vaccine  You may need this if you have certain conditions. Hepatitis A vaccine  You may need this if you have certain conditions or if you travel or work in places where you may be exposed  to hepatitis A. Hepatitis B vaccine  You may need this if you have certain conditions or if you travel or work in places where you may be exposed to hepatitis B. Haemophilus influenzae type b (Hib) vaccine  You may need this if you have certain conditions. You may receive vaccines as individual doses or as more than one vaccine together in one shot (combination vaccines). Talk with your health care provider about the risks and benefits of combination vaccines. What tests do I need? Blood tests  Lipid and cholesterol levels. These may be checked every 5 years, or more frequently depending on your overall health.  Hepatitis C test.  Hepatitis B test. Screening  Lung cancer screening. You may have this screening every year starting at age 72 if you have a 30-pack-year history of smoking and currently smoke or have quit within the past 15 years.  Colorectal cancer screening. All adults should have this screening starting at age 72 and continuing until age 15. Your health care provider may recommend screening at age 72 if you are at increased risk. You will have tests every 1-10 years, depending on your results and the type of screening test.  Diabetes screening. This is done by checking your blood sugar (glucose) after you have not eaten for a while (fasting). You may have this done every 1-3 years.  Mammogram. This may be done every 1-2 years. Talk with your health care provider about how often you should have regular mammograms.  BRCA-related cancer screening. This may be done if you have a family history of breast, ovarian, tubal, or peritoneal cancers.  Other tests  Sexually transmitted disease (STD) testing.  Bone density scan. This is done to screen for osteoporosis. You may have this done starting at age 72. Follow these instructions at home: Eating and drinking  Eat a diet that includes fresh fruits and vegetables, whole grains, lean protein, and low-fat dairy products. Limit  your intake of foods with high amounts of sugar, saturated fats, and salt.  Take vitamin and mineral supplements as recommended by your health care provider.  Do not drink alcohol if your health care provider tells you not to drink.  If you drink alcohol: ? Limit how much you have to 0-1 drink a day. ? Be aware of how much alcohol is in your drink. In the [place] one drink equals one 12 oz bottle of beer (355 mL), one 5 oz glass of wine (148 mL), or one 1 oz glass of hard liquor (44 mL). Lifestyle  Take daily care of your teeth and gums.  Stay active. Exercise for at least 30 minutes on 5 or more days each week.  Do not use any products that contain nicotine or tobacco, such as cigarettes, e-cigarettes, and chewing tobacco. If you need help quitting, ask your health care provider.  If you are sexually active, practice safe sex. Use a condom or other form of protection in order to prevent STIs (sexually transmitted infections).  Talk with your health care provider about taking a low-dose aspirin or statin. What's next?  Go to your health care provider once a year for a well check visit.  Ask your health care provider how often you should have your eyes and teeth checked.  Stay up to date on all vaccines. This information is not intended to replace advice given to you by your health care provider. Make sure you discuss any questions you have with your health care provider. Document Released: 11/15/2015 Document Revised: 10/13/2018 Document Reviewed: 10/13/2018 Elsevier Patient Education  2020 [employer].

## 2019-08-24 NOTE — Assessment & Plan Note (Signed)
Continue OTC supplement Recheck level 

## 2019-08-24 NOTE — Assessment & Plan Note (Signed)
Followed by neurology and psychiatry Seems to be at baseline today

## 2019-08-24 NOTE — Assessment & Plan Note (Signed)
Followed by psychiatry Seeing a therapist No changes to medications

## 2019-08-24 NOTE — Progress Notes (Signed)
[facility]  [address] [place], [place] [address] Phone: [phone]  Fax: [phone]   Clinic Day:  08/28/2019  Referring physician: Virginia Crews, MD  Chief Complaint: Kendra [name] is a 73 y.o. female with multifocal stage IA right breast cancer and B12 deficiency who is seen for 3 month assesement   HPI: The patient was last seen in the medical oncology clinic on 06/01/2019. At that time, she was doing well.  She denied any concerns.  Exam was stable.  AST was 37 and ALT 105.  Hepatitis A, B, and C testing was negative.  Bilateral diagnostic mammogram on 06/12/2019 showed no evidence of malignancy in either breast.  LFTs followed: 06/14/2019: ALT 40 and AST 29.   07/15/2019: ALT 23 and AST 17.  During the interim, she has done well except for bursitis in her hip and knees.  She is still doing her B-12 injection; the last injection was received on 08/03/2019. She will get another injection in 09/2019. She is having cataract surgery on 08/30/2019.  We discussed avoiding crowds, wearing a face mask while in public, and practicing stringent handwashing.   Past Medical History:  Diagnosis Date  . Anxiety   . Breast cancer ([place]) 03/2018   right breast cancer at 11:00 and 1:00  . Bursitis    bilateral hips and knees  . Complication of anesthesia   . Depression   . Family history of breast cancer 03/24/2018  . Family history of lung cancer 03/24/2018  . History of alcohol dependence ([place]) 2007   no ETOH; resolved since 2007  . History of hiatal hernia   . History of psychosis 2014   due to sleep disturbance  . Hypertension   . Personal history of radiation therapy 06/2018-07/2018   right breast ca  . PONV (postoperative nausea and vomiting)     Past Surgical History:  Procedure Laterality Date  . BREAST BIOPSY Right 03/14/2018   11:00 DCIS and invasive ductal carcinoma  . BREAST BIOPSY Right 03/14/2018   1:00 Invasive  ductal carcinoma  . BREAST LUMPECTOMY Right 04/27/2018   lumpectomy of 11 and 1:00 cancers, clear margins, negative LN  . BREAST LUMPECTOMY WITH RADIOACTIVE SEED AND SENTINEL LYMPH NODE BIOPSY Right 04/27/2018   Procedure: RIGHT BREAST LUMPECTOMY WITH RADIOACTIVE SEED X 2 AND RIGHT SENTINEL LYMPH NODE BIOPSY;  Surgeon: [name], MD;  Location: [facility];  Service: General;  Laterality: Right;  . LAPAROTOMY N/A 02/10/2018   Procedure: EXPLORATORY LAPAROTOMY;  Surgeon: Isabel Caprice, MD;  Location: [facility];  Service: Gynecology;  Laterality: N/A;  . MASS EXCISION  01/2018   abdominal  . OVARIAN CYST REMOVAL    . SALPINGOOPHORECTOMY Bilateral 02/10/2018   Procedure: BILATERAL SALPINGO OOPHORECTOMY; PERITONEAL WASHINGS;  Surgeon: Isabel Caprice, MD;  Location: [facility];  Service: Gynecology;  Laterality: Bilateral;  . TONSILLECTOMY    . TONSILLECTOMY AND ADENOIDECTOMY  1953    Family History  Problem Relation Age of Onset  . Diabetes Mother   . Breast cancer Mother 48  . Hypertension Mother   . Lung cancer Father        asbestos exposure  . Heart disease Brother 49  . Breast cancer Cousin 84       paternal cousin  . Heart disease Paternal Grandmother 58  . Heart disease Paternal Grandfather     Social History:  reports that she has never smoked. She has never used smokeless tobacco. She  reports previous alcohol use. She reports that she does not use drugs. She is married to her husband [name]. They live in [place]. The patient is alone today.  Allergies:  Allergies  Allergen Reactions  . Hydroxyzine Anaphylaxis    Tongue swollen  . Shrimp [Shellfish Allergy] Other (See Comments)    Food poisoning     Current Medications: Current Outpatient Medications  Medication Sig Dispense Refill  . cyanocobalamin (,VITAMIN B-12,) 1000 MCG/ML injection Inject 1 mL (1,000 mcg total) into the muscle every 30 (thirty) days. 3 mL 3  . docusate sodium (COLACE) 100 MG  capsule Take 1 capsule (100 mg total) by mouth daily. 30 capsule 3  . ERGOCALCIFEROL PO Take 2,400 mg by mouth daily.    Marland Kitchen FLUoxetine HCl 60 MG TABS Take 60 mg by mouth daily. 90 tablet 1  . letrozole (FEMARA) 2.5 MG tablet Take 1 tablet (2.5 mg total) by mouth daily. 90 tablet 3  . lisinopril-hydrochlorothiazide (ZESTORETIC) 20-12.5 MG tablet Take 1 tablet by mouth daily. 90 tablet 3  . Melatonin 10 MG TABS Take 30 mg by mouth at bedtime.    . risperiDONE (RISPERDAL) 3 MG tablet Take 0.5 tablets (1.5 mg total) by mouth 2 (two) times daily. 90 tablet 1  . naproxen sodium (ALEVE) 220 MG tablet Take 1 tablet (220 mg total) by mouth 2 (two) times daily as needed. (Patient not taking: Reported on 08/28/2019) 60 tablet 1   No current facility-administered medications for this visit.     Review of Systems  Constitutional: Negative for chills, diaphoresis, fever, malaise/fatigue and weight loss (up 18 lbs since 06/01/2019).       Feels "pretty good".  HENT: Negative.  Negative for congestion, ear pain, hearing loss, nosebleeds, sinus pain and sore throat.   Eyes: Negative.  Negative for blurred vision, double vision, photophobia and redness.  Respiratory: Positive for shortness of breath (walking fast). Negative for cough, sputum production and wheezing.   Cardiovascular: Negative.  Negative for chest pain, palpitations, leg swelling and PND.  Gastrointestinal: Positive for constipation. Negative for abdominal pain, blood in stool, diarrhea, heartburn, nausea and vomiting.       Eating well.  Genitourinary: Negative.  Negative for dysuria, frequency, hematuria and urgency.  Musculoskeletal: Positive for joint pain (bursitis in hips and knees). Negative for back pain, myalgias and neck pain.  Skin: Negative.  Negative for itching and rash.  Neurological: Negative for dizziness, sensory change, speech change, focal weakness, weakness (balance is good) and headaches.       Balance "improved".   Endo/Heme/Allergies: Does not bruise/bleed easily.       Vasomotor symptoms "come and go".   Psychiatric/Behavioral: Positive for memory loss. Negative for depression. The patient is not nervous/anxious.   All other systems reviewed and are negative.  Performance status (ECOG): 1 - Symptomatic but completely ambulatory  Vitals Blood pressure 127/60, pulse 65, temperature (!) 97 F (36.1 C), temperature source Tympanic, resp. rate 18, height _0  (1.676 m), weight 192 lb 9.2 oz (87.4 kg), SpO2 100 %.   Physical Exam  Constitutional: She is oriented to person, place, and time. She appears well-developed and well-nourished. No distress.  HENT:  Head: Normocephalic and atraumatic.  Mouth/Throat: Oropharynx is clear and moist. No oropharyngeal exudate.  Shoulder length blonde hair.  Mask.  Eyes: Pupils are equal, round, and reactive to light. Conjunctivae and EOM are normal. No scleral icterus.  Blue eyes.  Neck: Normal range of motion. Neck supple. No JVD  present.  Cardiovascular: Normal rate, regular rhythm and normal heart sounds.  No murmur heard. Pulmonary/Chest: Effort normal and breath sounds normal. No respiratory distress. She has no wheezes. She has no rales. She exhibits edema (right breast, inferior quadrant). She exhibits no tenderness. Right breast exhibits tenderness. Left breast exhibits skin change (fibrocystic changes in the outer quadrant.).  Right breast tenderness.  Edema under right breast.  Tender fibrocystic changes.  Abdominal: Soft. Bowel sounds are normal. She exhibits no distension and no mass. There is no abdominal tenderness. There is no rebound and no guarding. A hernia (hiatal hernia) is present.  Hernia left of midline.  Musculoskeletal: Normal range of motion.        General: No tenderness or edema.  Lymphadenopathy:    She has no cervical adenopathy.    She has no axillary adenopathy.       Right: No supraclavicular adenopathy present.       Left: No  supraclavicular adenopathy present.  Neurological: She is oriented to person, place, and time.  Skin: Skin is warm and dry. No rash noted. She is not diaphoretic. No erythema. No pallor.  Psychiatric: She has a normal mood and affect. Her behavior is normal. Judgment and thought content normal.  Nursing note and vitals reviewed.   Appointment on 08/28/2019  Component Date Value Ref Range Status  . Sodium 08/28/2019 134* 135 - 145 mmol/L Final  . Potassium 08/28/2019 4.0  3.5 - 5.1 mmol/L Final  . Chloride 08/28/2019 100  98 - 111 mmol/L Final  . CO2 08/28/2019 24  22 - 32 mmol/L Final  . Glucose, Bld 08/28/2019 86  70 - 99 mg/dL Final  . BUN 08/28/2019 10  8 - 23 mg/dL Final  . Creatinine, Ser 08/28/2019 0.78  0.44 - 1.00 mg/dL Final  . Calcium 08/28/2019 9.3  8.9 - 10.3 mg/dL Final  . Total Protein 08/28/2019 6.7  6.5 - 8.1 g/dL Final  . Albumin 08/28/2019 4.1  3.5 - 5.0 g/dL Final  . AST 08/28/2019 24  15 - 41 U/L Final  . ALT 08/28/2019 28  0 - 44 U/L Final  . Alkaline Phosphatase 08/28/2019 51  38 - 126 U/L Final  . Total Bilirubin 08/28/2019 0.7  0.3 - 1.2 mg/dL Final  . GFR calc non Af Amer 08/28/2019 >60  >60 mL/min Final  . GFR calc Af Amer 08/28/2019 >60  >60 mL/min Final  . Anion gap 08/28/2019 10  5 - 15 Final   Performed at [facility], [address], [place], [place] [address]  . WBC 08/28/2019 5.3  4.0 - 10.5 K/uL Final  . RBC 08/28/2019 4.52  3.87 - 5.11 MIL/uL Final  . Hemoglobin 08/28/2019 12.7  12.0 - 15.0 g/dL Final  . HCT 08/28/2019 38.6  36.0 - 46.0 % Final  . MCV 08/28/2019 85.4  80.0 - 100.0 fL Final  . MCH 08/28/2019 28.1  26.0 - 34.0 pg Final  . MCHC 08/28/2019 32.9  30.0 - 36.0 g/dL Final  . RDW 08/28/2019 13.8  11.5 - 15.5 % Final  . Platelets 08/28/2019 185  150 - 400 K/uL Final  . nRBC 08/28/2019 0.0  0.0 - 0.2 % Final  . Neutrophils Relative % 08/28/2019 64  % Final  . Neutro Abs 08/28/2019 3.4  1.7 - 7.7 K/uL Final  . Lymphocytes  Relative 08/28/2019 20  % Final  . Lymphs Abs 08/28/2019 1.1  0.7 - 4.0 K/uL Final  . Monocytes Relative 08/28/2019  11  % Final  . Monocytes Absolute 08/28/2019 0.6  0.1 - 1.0 K/uL Final  . Eosinophils Relative 08/28/2019 4  % Final  . Eosinophils Absolute 08/28/2019 0.2  0.0 - 0.5 K/uL Final  . Basophils Relative 08/28/2019 1  % Final  . Basophils Absolute 08/28/2019 0.1  0.0 - 0.1 K/uL Final  . Immature Granulocytes 08/28/2019 0  % Final  . Abs Immature Granulocytes 08/28/2019 0.02  0.00 - 0.07 K/uL Final   Performed at [facility], [address], [place], [place] [address]    Assessment:  Kendra [name] is a 73 y.o. female multi-focal stage IA right breast cancers/p right lumpectomy on 04/27/2018. Pathology of the right lateral lesionrevealed a 2.5 cm grade II invasive ductal carcinoma with intermediate grade DCIS. The medial lesionrevealed a 1.1 cm grade II invasive ductal carcinoma with intermediate grade DCIS. LVIDS was present. Margins were negative. Zero of 2 lymph nodes were positive. Both tumors were ER + (100%), PR + (70%), and HER-2 negative. Ki-67 was 10%. Pathologic stage was mT2N0.  Oncotype DX revealed a recurrence score of 12 which translated to a distant recurrence of 3% at 9 years with an AI or tamxifen alone. The absolute benefit of chemotherpay was <1%.  Invitae testingon 03/23/2018 revealed a variant of uncertain significance in MSH6.   She received breast radiationfrom 06/30/2018 - 07/20/2018. She began [facility] 09/06/2018. She is tolerating it well.  Bilateral diagnostic mammogram on 06/12/2019 showed no evidence of malignancy in either breast.  CA27.29has been followed: 25.4 on 03/01/2019, 23.5 on 06/01/2019, and 19.0 on 08/28/2019.  She underwent exploratory laparotomy, bilateral salpingo-oophrectomy, and pelvic washings on 02/10/2018. 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 Peritoneal washings revealed  atypical cells.   She has had elevated tumor markers.CA125has been followed (0-38.1):48.2on 03/27/2019and11.3 on 10/24/2018.CA19-9has been followed (0-35):322 on 01/26/2018,48 on 10/24/2018, and 45 on 03/01/2019.  Abdomen and pelvic CTon 02/04/2018 revealed a large (28.5 x 17 x 25 cm) cystic mass with enhancing septations and nodularity involving the majority of the abdomen. Abdomen and pelvic CTon 10/14/2018 revealed no acute findings. There was interval surgical resection of large cystic pelvic mass. There was no evidence of residual or metastatic neoplasm. There was suspected hepatic cirrhosis. There were three midline ventral abdominal wall hernias (small epigastric ventral hernia, moderate paraumbilical hernia, and small suprapubic ventral hernia).   She has a history ofelevated LFTs. AST was 50 on 10/06/2018. Hepatitis B and C serologies were negative.  Bone densityon 06/28/2018 was normal with a T score of -0.8 in the right femoral neck.  She has B12 deficiency. B12 was 232 on 08/09/2018 and 386 on 10/24/2018. She began B12 injectionson 08/15/2018.She receives B12 monthly at home.Anti-parietal antibody was elevated (23; 0-20) and intrinsic factor was normal on 10/24/2018.  Symptomatically, she is doing well.  Exam reveals tender right breast.  Plan: 1.   Labs today:  CBC with diff, CMP, CA27.29. 2.   Multifocal stage IA RIGHT breast cancer Clinically, she continues to do well.               Exam Reveals no evidence of recurrent disease.               CA27.29 remains normal. Bilateral diagnostic mammogram on 06/12/2019 showed no evidence of malignancy.             Rx: Femara 2.5 mg a day (dis: #90, 3 refills). 3.Elevated liver function tests, resolved AST 24. ALT 28. Bilirubin 0.7. Alkaline  phosphatase 51.  Abdomen and pelvic CT on 10/14/2018 revealed no liver lesions. Patient denies any alcohol use.          Hepatitis A, B, and C testing was negative.        Continue to monitor. 4. B12 deficiency Patient on oral B12.   Parietal cell antibody was positive on 12/23/2019c/wpernicious anemia. Check folate periodically. 5. Multilocular mucinous cystadenoma Benign pathology. Some atypical cells noted washings. Abdomen and pelvic CT scanswere negative on 10/14/2018. She has a history of increased markers(CA19-9 and CA125). Continue to monitor. 6.   RTC in 4 months for MD assessment, labs (CBC with diff, CMP, CA27.29, CA19-9).  I discussed the assessment and treatment plan with the patient.  The patient was provided an opportunity to ask questions and all were answered.  The patient agreed with the plan and demonstrated an understanding of the instructions.  The patient was advised to call back if the symptoms worsen or if the condition fails to improve as anticipated.  I provided 16 minutes (11:10 AM - 11:25 AM) of face-to-face time during this this encounter and > 50% was spent counseling as documented under my assessment and plan.    Lequita Asal, MD, PhD    08/28/2019, 4:18 PM  I, Samul Dada, am acting as a scribe for Lequita Asal, MD.  I, [place] Mike Gip, MD, have reviewed the above documentation for accuracy and completeness, and I agree with the above.

## 2019-08-25 ENCOUNTER — Other Ambulatory Visit: Admission: RE | Admit: 2019-08-25 | Payer: Medicare Other | Source: Ambulatory Visit

## 2019-08-25 ENCOUNTER — Other Ambulatory Visit: Payer: Self-pay

## 2019-08-25 DIAGNOSIS — Z20822 Contact with and (suspected) exposure to covid-19: Secondary | ICD-10-CM

## 2019-08-27 LAB — NOVEL CORONAVIRUS, NAA: SARS-CoV-2, NAA: NOT DETECTED

## 2019-08-28 ENCOUNTER — Other Ambulatory Visit: Payer: Self-pay

## 2019-08-28 ENCOUNTER — Encounter: Payer: Self-pay | Admitting: Hematology and Oncology

## 2019-08-28 ENCOUNTER — Inpatient Hospital Stay: Payer: Medicare Other | Attending: Hematology and Oncology

## 2019-08-28 ENCOUNTER — Inpatient Hospital Stay: Payer: Medicare Other | Admitting: Hematology and Oncology

## 2019-08-28 ENCOUNTER — Other Ambulatory Visit
Admission: RE | Admit: 2019-08-28 | Discharge: 2019-08-28 | Disposition: A | Discharge status: Home / Self Care | Payer: Medicare Other | Attending: Family Medicine | Admitting: Family Medicine

## 2019-08-28 VITALS — BP 127/60 | HR 65 | Temp 97.0°F | Resp 18 | Ht 66.0 in | Wt 192.6 lb

## 2019-08-28 DIAGNOSIS — E559 Vitamin D deficiency, unspecified: Secondary | ICD-10-CM | POA: Diagnosis present

## 2019-08-28 DIAGNOSIS — K439 Ventral hernia without obstruction or gangrene: Secondary | ICD-10-CM | POA: Insufficient documentation

## 2019-08-28 DIAGNOSIS — Z801 Family history of malignant neoplasm of trachea, bronchus and lung: Secondary | ICD-10-CM | POA: Diagnosis not present

## 2019-08-28 DIAGNOSIS — R7989 Other specified abnormal findings of blood chemistry: Secondary | ICD-10-CM | POA: Insufficient documentation

## 2019-08-28 DIAGNOSIS — E538 Deficiency of other specified B group vitamins: Secondary | ICD-10-CM | POA: Diagnosis not present

## 2019-08-28 DIAGNOSIS — Z79899 Other long term (current) drug therapy: Secondary | ICD-10-CM | POA: Diagnosis not present

## 2019-08-28 DIAGNOSIS — C50411 Malignant neoplasm of upper-outer quadrant of right female breast: Secondary | ICD-10-CM

## 2019-08-28 DIAGNOSIS — C50811 Malignant neoplasm of overlapping sites of right female breast: Secondary | ICD-10-CM | POA: Insufficient documentation

## 2019-08-28 DIAGNOSIS — C50211 Malignant neoplasm of upper-inner quadrant of right female breast: Secondary | ICD-10-CM

## 2019-08-28 DIAGNOSIS — Z833 Family history of diabetes mellitus: Secondary | ICD-10-CM | POA: Insufficient documentation

## 2019-08-28 DIAGNOSIS — I10 Essential (primary) hypertension: Secondary | ICD-10-CM | POA: Insufficient documentation

## 2019-08-28 DIAGNOSIS — Z90722 Acquired absence of ovaries, bilateral: Secondary | ICD-10-CM | POA: Diagnosis not present

## 2019-08-28 DIAGNOSIS — R1900 Intra-abdominal and pelvic swelling, mass and lump, unspecified site: Secondary | ICD-10-CM | POA: Diagnosis not present

## 2019-08-28 DIAGNOSIS — D649 Anemia, unspecified: Secondary | ICD-10-CM | POA: Diagnosis not present

## 2019-08-28 DIAGNOSIS — D279 Benign neoplasm of unspecified ovary: Secondary | ICD-10-CM | POA: Insufficient documentation

## 2019-08-28 DIAGNOSIS — Z79811 Long term (current) use of aromatase inhibitors: Secondary | ICD-10-CM | POA: Diagnosis not present

## 2019-08-28 DIAGNOSIS — Z803 Family history of malignant neoplasm of breast: Secondary | ICD-10-CM | POA: Diagnosis not present

## 2019-08-28 DIAGNOSIS — M7070 Other bursitis of hip, unspecified hip: Secondary | ICD-10-CM | POA: Diagnosis not present

## 2019-08-28 DIAGNOSIS — Z170 Estrogen receptor positive status [ER+]: Secondary | ICD-10-CM | POA: Diagnosis not present

## 2019-08-28 DIAGNOSIS — Z8249 Family history of ischemic heart disease and other diseases of the circulatory system: Secondary | ICD-10-CM | POA: Insufficient documentation

## 2019-08-28 LAB — VITAMIN D 25 HYDROXY (VIT D DEFICIENCY, FRACTURES): Vit D, 25-Hydroxy: 21.96 ng/mL — ABNORMAL LOW (ref 30–100)

## 2019-08-28 LAB — CBC WITH DIFFERENTIAL/PLATELET
Abs Immature Granulocytes: 0.02 10*3/uL (ref 0.00–0.07)
Basophils Absolute: 0.1 10*3/uL (ref 0.0–0.1)
Basophils Relative: 1 %
Eosinophils Absolute: 0.2 10*3/uL (ref 0.0–0.5)
Eosinophils Relative: 4 %
HCT: 38.6 % (ref 36.0–46.0)
Hemoglobin: 12.7 g/dL (ref 12.0–15.0)
Immature Granulocytes: 0 %
Lymphocytes Relative: 20 %
Lymphs Abs: 1.1 10*3/uL (ref 0.7–4.0)
MCH: 28.1 pg (ref 26.0–34.0)
MCHC: 32.9 g/dL (ref 30.0–36.0)
MCV: 85.4 fL (ref 80.0–100.0)
Monocytes Absolute: 0.6 10*3/uL (ref 0.1–1.0)
Monocytes Relative: 11 %
Neutro Abs: 3.4 10*3/uL (ref 1.7–7.7)
Neutrophils Relative %: 64 %
Platelets: 185 10*3/uL (ref 150–400)
RBC: 4.52 MIL/uL (ref 3.87–5.11)
RDW: 13.8 % (ref 11.5–15.5)
WBC: 5.3 10*3/uL (ref 4.0–10.5)
nRBC: 0 % (ref 0.0–0.2)

## 2019-08-28 LAB — COMPREHENSIVE METABOLIC PANEL
ALT: 28 U/L (ref 0–44)
AST: 24 U/L (ref 15–41)
Albumin: 4.1 g/dL (ref 3.5–5.0)
Alkaline Phosphatase: 51 U/L (ref 38–126)
Anion gap: 10 (ref 5–15)
BUN: 10 mg/dL (ref 8–23)
CO2: 24 mmol/L (ref 22–32)
Calcium: 9.3 mg/dL (ref 8.9–10.3)
Chloride: 100 mmol/L (ref 98–111)
Creatinine, Ser: 0.78 mg/dL (ref 0.44–1.00)
GFR calc Af Amer: >60 mL/min (ref >60)
GFR calc non Af Amer: >60 mL/min (ref >60)
Glucose, Bld: 86 mg/dL (ref 70–99)
Potassium: 4 mmol/L (ref 3.5–5.1)
Sodium: 134 mmol/L — ABNORMAL LOW (ref 135–145)
Total Bilirubin: 0.7 mg/dL (ref 0.3–1.2)
Total Protein: 6.7 g/dL (ref 6.5–8.1)

## 2019-08-28 LAB — VITAMIN B12: Vitamin B-12: 435 pg/mL (ref 180–914)

## 2019-08-28 MED ORDER — LETROZOLE 2.5 MG PO TABS: 2.5000 mg | ORAL_TABLET | Freq: Every day | ORAL | 3 refills | Status: DC

## 2019-08-28 NOTE — Progress Notes (Signed)
No new changes noted today 

## 2019-08-29 ENCOUNTER — Telehealth: Payer: Self-pay

## 2019-08-29 LAB — CANCER ANTIGEN 27.29: CA 27.29: 19 U/mL (ref 0.0–38.6)

## 2019-08-29 NOTE — Telephone Encounter (Signed)
Pt advised.  She says she is taking 1600 units of Vitamin D a day.  Do you want her to increase the units?   Thanks,   -Mickel Baas

## 2019-08-29 NOTE — Telephone Encounter (Signed)
Patient advised as below. Patient verbalizes understanding and is in agreement with treatment plan.  

## 2019-08-29 NOTE — Discharge Instructions (Signed)

## 2019-08-29 NOTE — Telephone Encounter (Signed)
Yes. Increase to 2000 units of Vit D3 daily

## 2019-08-29 NOTE — Telephone Encounter (Signed)
-----   Message from Virginia Crews, MD sent at 08/29/2019  9:08 AM EDT ----- Normal B12.  Vitamin D is slightly low.  Is she taking a supplement?  If not, I recommend vitamin D3 1000 to 2000 units daily.

## 2019-08-30 ENCOUNTER — Ambulatory Visit: Payer: Medicare Other | Admitting: Anesthesiology

## 2019-08-30 ENCOUNTER — Other Ambulatory Visit: Payer: Self-pay

## 2019-08-30 ENCOUNTER — Encounter
Admission: RE | Disposition: A | Discharge status: Home / Self Care | Payer: Self-pay | Source: Home / Self Care | Attending: Ophthalmology

## 2019-08-30 ENCOUNTER — Ambulatory Visit
Admission: RE | Admit: 2019-08-30 | Discharge: 2019-08-30 | Disposition: A | Discharge status: Home / Self Care | Payer: Medicare Other | Attending: Ophthalmology | Admitting: Ophthalmology

## 2019-08-30 DIAGNOSIS — F329 Major depressive disorder, single episode, unspecified: Secondary | ICD-10-CM | POA: Diagnosis not present

## 2019-08-30 DIAGNOSIS — Z79811 Long term (current) use of aromatase inhibitors: Secondary | ICD-10-CM | POA: Insufficient documentation

## 2019-08-30 DIAGNOSIS — I10 Essential (primary) hypertension: Secondary | ICD-10-CM | POA: Diagnosis not present

## 2019-08-30 DIAGNOSIS — C50919 Malignant neoplasm of unspecified site of unspecified female breast: Secondary | ICD-10-CM | POA: Diagnosis not present

## 2019-08-30 DIAGNOSIS — F419 Anxiety disorder, unspecified: Secondary | ICD-10-CM | POA: Diagnosis not present

## 2019-08-30 DIAGNOSIS — H2512 Age-related nuclear cataract, left eye: Secondary | ICD-10-CM | POA: Insufficient documentation

## 2019-08-30 DIAGNOSIS — Z79899 Other long term (current) drug therapy: Secondary | ICD-10-CM | POA: Diagnosis not present

## 2019-08-30 HISTORY — DX: Bursopathy, unspecified: M71.9

## 2019-08-30 HISTORY — DX: Personal history of other diseases of the digestive system: Z87.19

## 2019-08-30 HISTORY — PX: CATARACT EXTRACTION W/PHACO: SHX586

## 2019-08-30 SURGERY — PHACOEMULSIFICATION, CATARACT, WITH IOL INSERTION
Anesthesia: Monitor Anesthesia Care | Site: Eye | Laterality: Left

## 2019-08-30 MED ORDER — ARMC OPHTHALMIC DILATING DROPS
1.0000 "application " | OPHTHALMIC | Status: DC | PRN
  Administered 2019-08-30 (×3): 1 via OPHTHALMIC

## 2019-08-30 MED ORDER — TETRACAINE HCL 0.5 % OP SOLN
1.0000 [drp] | OPHTHALMIC | Status: DC | PRN
  Administered 2019-08-30 (×3): 1 [drp] via OPHTHALMIC

## 2019-08-30 MED ORDER — ONDANSETRON HCL 4 MG/2ML IJ SOLN: 4.0000 mg | Freq: Once | INTRAMUSCULAR | Status: DC | PRN

## 2019-08-30 MED ORDER — BRIMONIDINE TARTRATE-TIMOLOL 0.2-0.5 % OP SOLN
OPHTHALMIC | Status: DC | PRN
  Administered 2019-08-30: 1 [drp] via OPHTHALMIC

## 2019-08-30 MED ORDER — LACTATED RINGERS IV SOLN: INTRAVENOUS | Status: DC

## 2019-08-30 MED ORDER — CEFUROXIME OPHTHALMIC INJECTION 1 MG/0.1 ML
INJECTION | OPHTHALMIC | Status: DC | PRN
  Administered 2019-08-30: 0.1 mL via INTRACAMERAL

## 2019-08-30 MED ORDER — NA HYALUR & NA CHOND-NA HYALUR 0.4-0.35 ML IO KIT
PACK | INTRAOCULAR | Status: DC | PRN
  Administered 2019-08-30: 1 mL via INTRAOCULAR

## 2019-08-30 MED ORDER — MOXIFLOXACIN HCL 0.5 % OP SOLN
1.0000 [drp] | OPHTHALMIC | Status: DC | PRN
  Administered 2019-08-30 (×3): 1 [drp] via OPHTHALMIC

## 2019-08-30 MED ORDER — MIDAZOLAM HCL 2 MG/2ML IJ SOLN
INTRAMUSCULAR | Status: DC | PRN
  Administered 2019-08-30: 1.5 mg via INTRAVENOUS
  Administered 2019-08-30: 0.5 mg via INTRAVENOUS

## 2019-08-30 MED ORDER — EPINEPHRINE PF 1 MG/ML IJ SOLN
INTRAOCULAR | Status: DC | PRN
  Administered 2019-08-30: 76 mL via OPHTHALMIC

## 2019-08-30 MED ORDER — LIDOCAINE HCL (PF) 2 % IJ SOLN
INTRAOCULAR | Status: DC | PRN
  Administered 2019-08-30: 1 mL

## 2019-08-30 MED ORDER — FENTANYL CITRATE (PF) 100 MCG/2ML IJ SOLN
INTRAMUSCULAR | Status: DC | PRN
  Administered 2019-08-30: 50 ug via INTRAVENOUS

## 2019-08-30 SURGICAL SUPPLY — 17 items
CANNULA ANT/CHMB 27G (MISCELLANEOUS) ×1 IMPLANT
CANNULA ANT/CHMB 27GA (MISCELLANEOUS) ×2 IMPLANT
GLOVE SURG LX 7.5 STRW (GLOVE) ×1
GLOVE SURG LX STRL 7.5 STRW (GLOVE) ×1 IMPLANT
GLOVE SURG TRIUMPH 8.0 PF LTX (GLOVE) ×2 IMPLANT
GOWN STRL REUS W/ TWL LRG LVL3 (GOWN DISPOSABLE) ×2 IMPLANT
GOWN STRL REUS W/TWL LRG LVL3 (GOWN DISPOSABLE) ×2
LENS IOL ACRSF IQ PAN 14.5 IMPLANT
LENS IOL IQ PANOPTIX 14.5 ×2 IMPLANT
MARKER SKIN DUAL TIP RULER LAB (MISCELLANEOUS) ×2 IMPLANT
PACK CATARACT BRASINGTON (MISCELLANEOUS) ×2 IMPLANT
PACK EYE AFTER SURG (MISCELLANEOUS) ×2 IMPLANT
PACK OPTHALMIC (MISCELLANEOUS) ×2 IMPLANT
SYR 3ML LL SCALE MARK (SYRINGE) ×2 IMPLANT
SYR TB 1ML LUER SLIP (SYRINGE) ×2 IMPLANT
WATER STERILE IRR 500ML POUR (IV SOLUTION) ×2 IMPLANT
WIPE NON LINTING 3.25X3.25 (MISCELLANEOUS) ×2 IMPLANT

## 2019-08-30 NOTE — Anesthesia Procedure Notes (Signed)
Procedure Name: MAC Performed by: Virgin Zellers, CRNA Pre-anesthesia Checklist: Patient identified, Emergency Drugs available, Suction available, Timeout performed and Patient being monitored Patient Re-evaluated:Patient Re-evaluated prior to induction Oxygen Delivery Method: Nasal cannula Placement Confirmation: positive ETCO2       

## 2019-08-30 NOTE — Anesthesia Preprocedure Evaluation (Signed)
Anesthesia Evaluation  Patient identified by MRN, date of birth, ID band Patient awake    Reviewed: Allergy & Precautions, NPO status , Patient's Chart, lab work & pertinent test results  History of Anesthesia Complications (+) PONV and history of anesthetic complications  Airway Mallampati: II  TM Distance: >3 FB Neck ROM: Full    Dental no notable dental hx.    Pulmonary    Pulmonary exam normal breath sounds clear to auscultation       Cardiovascular Exercise Tolerance: Good hypertension, Normal cardiovascular exam Rhythm:Regular Rate:Normal     Neuro/Psych PSYCHIATRIC DISORDERS Anxiety Depression Bipolar Disorder    GI/Hepatic Neg liver ROS, hiatal hernia,   Endo/Other  negative endocrine ROS  Renal/GU negative Renal ROS  negative genitourinary   Musculoskeletal negative musculoskeletal ROS (+)   Abdominal (+) + obese,  Abdomen: soft.    Peds negative pediatric ROS (+)  Hematology negative hematology ROS (+)   Anesthesia Other Findings   Reproductive/Obstetrics negative OB ROS                            Anesthesia Physical Anesthesia Plan  ASA: III  Anesthesia Plan: MAC   Post-op Pain Management:    Induction: Intravenous  PONV Risk Score and Plan: 3 and Treatment may vary due to age or medical condition  Airway Management Planned: Natural Airway  Additional Equipment:   Intra-op Plan:   Post-operative Plan:   Informed Consent: I have reviewed the patients History and Physical, chart, labs and discussed the procedure including the risks, benefits and alternatives for the proposed anesthesia with the patient or authorized representative who has indicated his/her understanding and acceptance.     Dental advisory given  Plan Discussed with: CRNA and Anesthesiologist  Anesthesia Plan Comments:         Anesthesia Quick Evaluation  Patient Active Problem  List   Diagnosis Date Noted  . Avitaminosis D 08/24/2019  . Insomnia due to mental condition 05/31/2019  . Bipolar I disorder, most recent episode depressed ([place]) 05/31/2019  . Mild cognitive disorder 05/31/2019  . Alcohol use disorder, moderate, in sustained remission ([place]) 05/31/2019  . Elevated tumor markers 05/27/2019  . GAD (generalized anxiety disorder) 04/10/2019  . Altered mental status 04/10/2019  . MDD (major depressive disorder), severe ([place]) 01/31/2019  . High serum carbohydrate antigen 19-9 (CA19-9) 12/01/2018  . Adjustment disorder with anxious mood 11/24/2018  . Essential hypertension 11/24/2018  . Screening for lipid disorders 11/24/2018  . B12 deficiency 10/17/2018  . Incisional hernia, without obstruction or gangrene 09/12/2018  . Genetic testing 04/07/2018  . Family history of breast cancer 03/24/2018  . Family history of lung cancer 03/24/2018  . Malignant neoplasm of upper-outer quadrant of right breast in female, estrogen receptor positive ([place]) 03/17/2018  . Malignant neoplasm of upper-inner quadrant of right breast in female, estrogen receptor positive ([place]) 03/17/2018  . History of alcohol dependence ([place]) 02/07/2018  . History of psychosis 02/07/2018  . History of insomnia 02/07/2018   CBC Latest Ref Rng & Units 08/28/2019 06/01/2019 04/12/2019  WBC 4.0 - 10.5 K/uL 5.3 4.6 4.0  Hemoglobin 12.0 - 15.0 g/dL 12.7 11.6(L) 14.1  Hematocrit 36.0 - 46.0 % 38.6 35.6(L) 42  Platelets 150 - 400 K/uL 185 212 222   BMP Latest Ref Rng & Units 08/28/2019 07/13/2019 06/14/2019  Glucose 70 - 99 mg/dL 86 83 81  BUN 8 - 23 mg/dL _0 Creatinine 0.44 -  1.00 mg/dL 0.78 0.70 0.71  BUN/Creat Ratio 12 - 28 - 16 14  Sodium 135 - 145 mmol/L 134(L) 138 137  Potassium 3.5 - 5.1 mmol/L 4.0 4.3 4.1  Chloride 98 - 111 mmol/L 100 102 99  CO2 22 - 32 mmol/L _0 Calcium 8.9 - 10.3 mg/dL 9.3 9.4 9.2   Risks and benefits of anesthesia discussed at length, patient or surrogate  demonstrates understanding. Appropriately NPO. Plan to proceed with anesthesia.  Champ Mungo, MD 08/30/19

## 2019-08-30 NOTE — Transfer of Care (Signed)
Immediate Anesthesia Transfer of Care Note  Patient: Kendra [name]  Procedure(s) Performed: CATARACT EXTRACTION PHACO AND INTRAOCULAR LENS PLACEMENT (IOC) LEFT panoptix toric  01:20.5  12.7%  10.26 (Left Eye)  Patient Location: PACU  Anesthesia Type: MAC  Level of Consciousness: awake, alert  and patient cooperative  Airway and Oxygen Therapy: Patient Spontanous Breathing and Patient connected to supplemental oxygen  Post-op Assessment: Post-op Vital signs reviewed, Patient's Cardiovascular Status Stable, Respiratory Function Stable, Patent Airway and No signs of Nausea or vomiting  Post-op Vital Signs: Reviewed and stable  Complications: No apparent anesthesia complications

## 2019-08-30 NOTE — H&P (Signed)

## 2019-08-30 NOTE — Anesthesia Postprocedure Evaluation (Signed)
Anesthesia Post Note  Patient: Kendra [name]  Procedure(s) Performed: CATARACT EXTRACTION PHACO AND INTRAOCULAR LENS PLACEMENT (IOC) LEFT panoptix toric  01:20.5  12.7%  10.26 (Left Eye)  Patient location during evaluation: PACU Anesthesia Type: MAC Level of consciousness: awake and alert Pain management: pain level controlled Vital Signs Assessment: post-procedure vital signs reviewed and stable Respiratory status: spontaneous breathing, nonlabored ventilation, respiratory function stable and patient connected to nasal cannula oxygen Cardiovascular status: stable and blood pressure returned to baseline Postop Assessment: no apparent nausea or vomiting Anesthetic complications: no    Sinda Du

## 2019-08-30 NOTE — Op Note (Signed)
OPERATIVE NOTE  Kendra [name] [id number] 08/30/2019   PREOPERATIVE DIAGNOSIS:  Nuclear sclerotic cataract left eye. H25.12   POSTOPERATIVE DIAGNOSIS:    Nuclear sclerotic cataract left eye.     PROCEDURE:  Phacoemusification with posterior chamber intraocular lens placement of the left eye  Ultrasound time: Procedure(s): CATARACT EXTRACTION PHACO AND INTRAOCULAR LENS PLACEMENT (IOC) LEFT panoptix toric  01:20.5  12.7%  10.26 (Left)  LENS:   Implant Name Type Inv. Item Serial No. Manufacturer Lot No. LRB No. Used Action  AcrySof IQ PanOptix IOL Intraocular Lens  [id number] ALCON  Left 1 Implanted    [id number] 14.5 D  SURGEON:  Wyonia Hough, MD   ANESTHESIA:  Topical with tetracaine drops and 2% Xylocaine jelly, augmented with 1% preservative-free intracameral lidocaine.    COMPLICATIONS:  None.   DESCRIPTION OF PROCEDURE:  The patient was identified in the holding room and transported to the operating room and placed in the supine position under the operating microscope.  The left eye was identified as the operative eye and it was prepped and draped in the usual sterile ophthalmic fashion.   A 1 millimeter clear-corneal paracentesis was made at the 1:30 position.  0.5 ml of preservative-free 1% lidocaine was injected into the anterior chamber.  The anterior chamber was filled with Viscoat viscoelastic.  A 2.4 millimeter keratome was used to make a near-clear corneal incision at the 10:30 position.  .  A curvilinear capsulorrhexis was made with a cystotome and capsulorrhexis forceps.  Balanced salt solution was used to hydrodissect and hydrodelineate the nucleus.   Phacoemulsification was then used in stop and chop fashion to remove the lens nucleus and epinucleus.  The remaining cortex was then removed using the irrigation and aspiration handpiece. Provisc was then placed into the capsular bag to distend it for lens placement.  A lens was then injected into the capsular bag.   The remaining viscoelastic was aspirated.   Wounds were hydrated with balanced salt solution.  The anterior chamber was inflated to a physiologic pressure with balanced salt solution.  No wound leaks were noted. Cefuroxime 0.1 ml of a 10mg /ml solution was injected into the anterior chamber for a dose of 1 mg of intracameral antibiotic at the completion of the case.   Timolol and Brimonidine drops were applied to the eye.  The patient was taken to the recovery room in stable condition without complications of anesthesia or surgery.  [name] 08/30/2019, 1:21 PM

## 2019-08-31 ENCOUNTER — Other Ambulatory Visit: Payer: Medicare Other

## 2019-08-31 ENCOUNTER — Encounter: Payer: Self-pay | Admitting: Ophthalmology

## 2019-08-31 ENCOUNTER — Ambulatory Visit: Payer: Medicare Other | Admitting: Hematology and Oncology

## 2019-09-04 IMAGING — MG MM BREAST LOCALIZATION CLIP
2 series · 2 of 2 positions shown · non-contrast
Comparison: Previous exam(s).

CLINICAL DATA: Post ultrasound-guided core needle biopsy of right
breast 11 o'clock and 1 o'clock masses.

EXAM:
DIAGNOSTIC RIGHT MAMMOGRAM POST ULTRASOUND BIOPSY

[R CC]
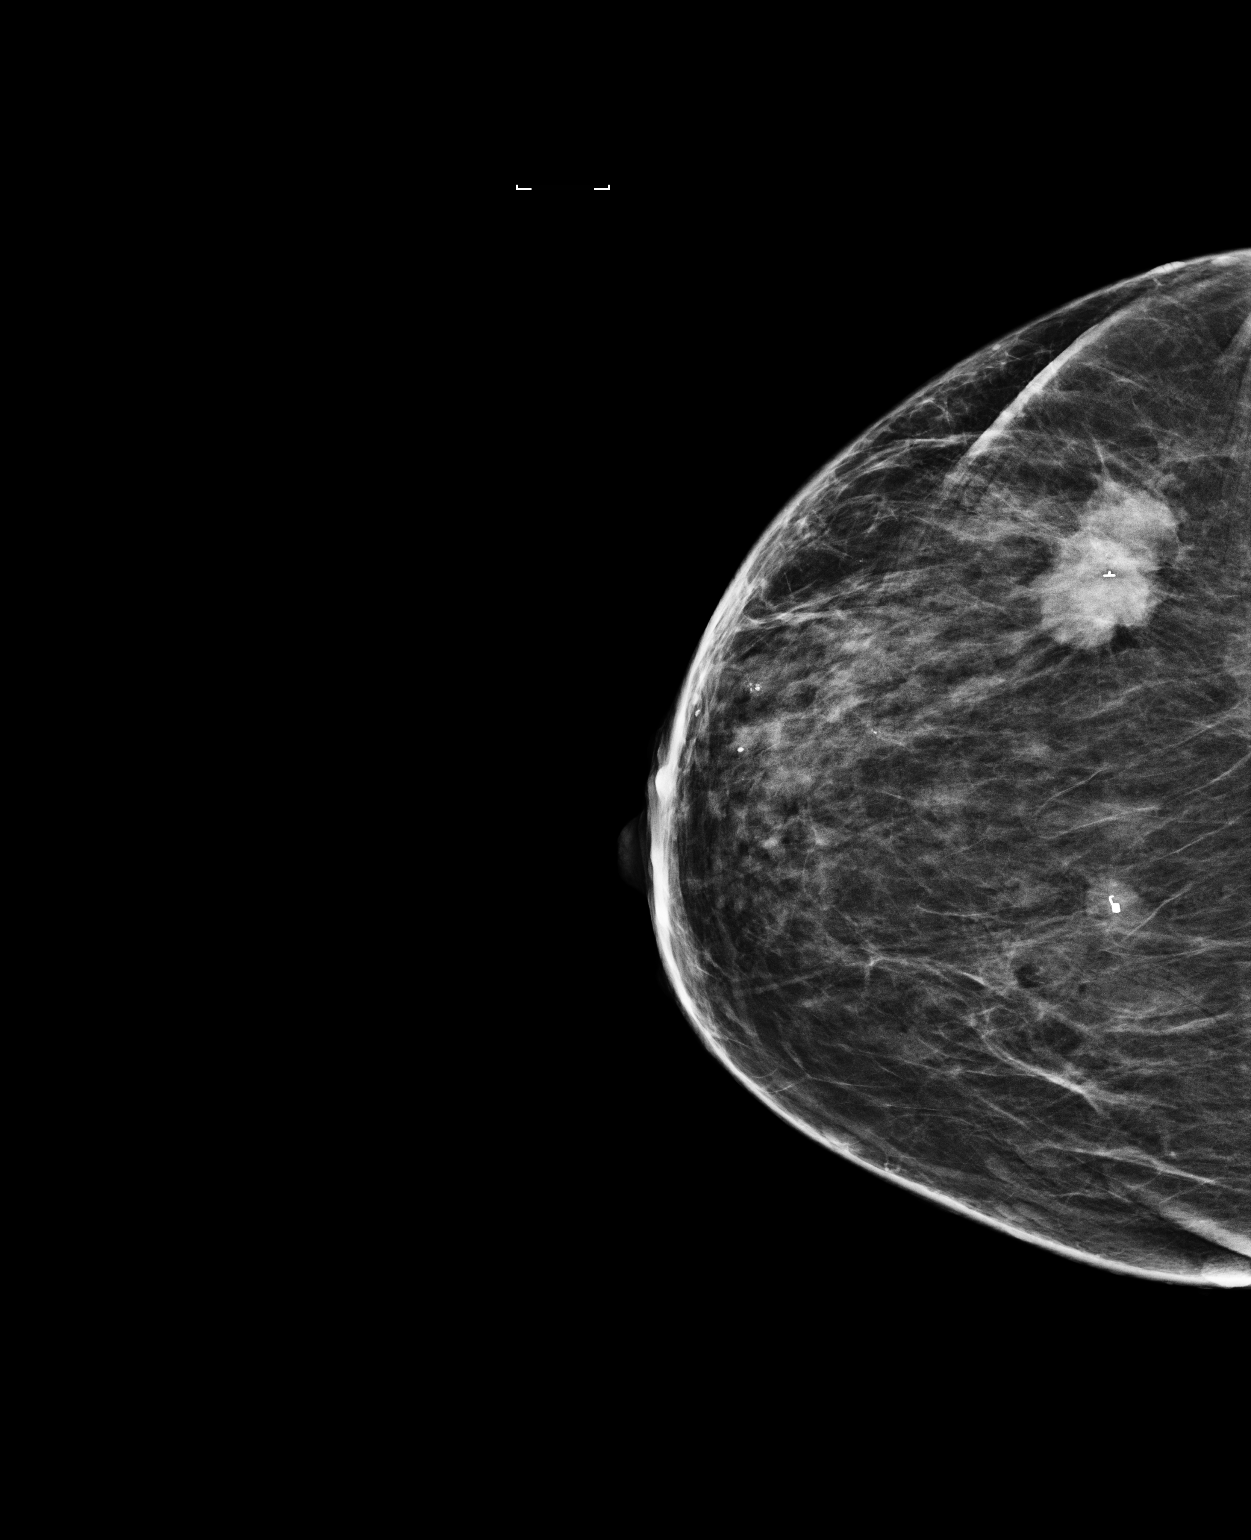

[R ML]
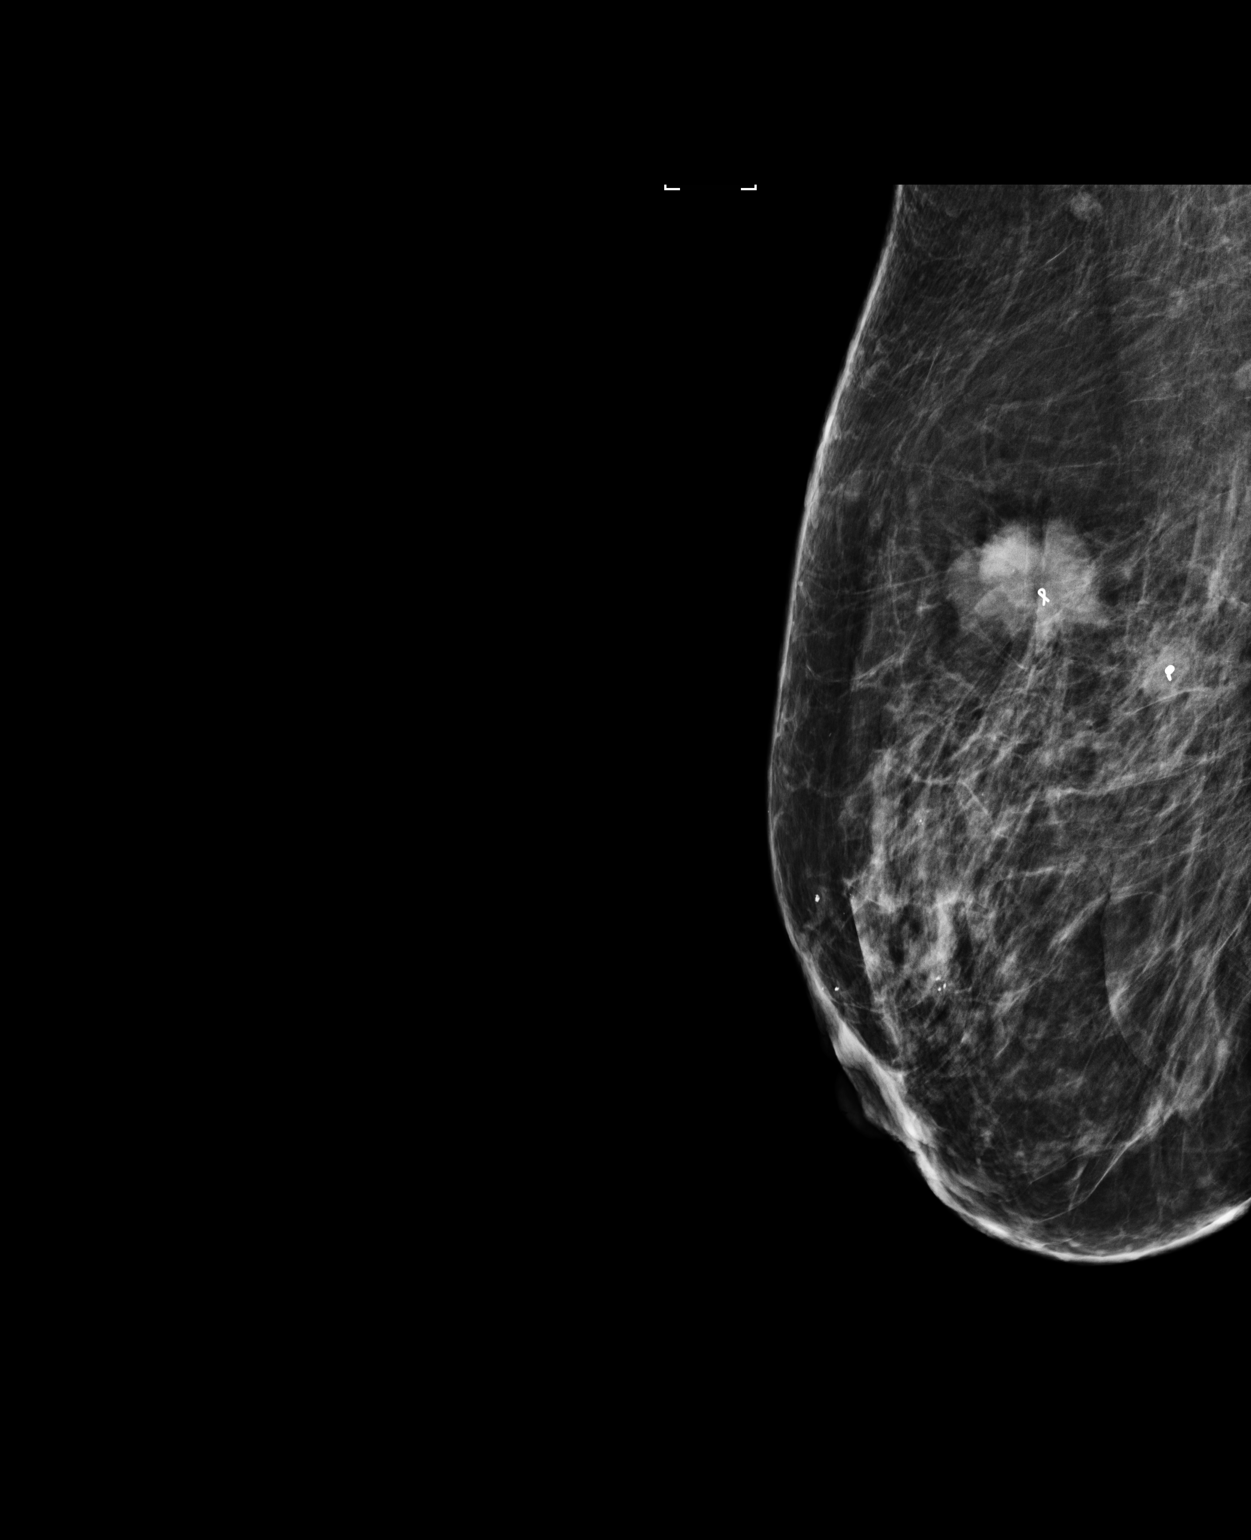

[2 of 2 positions shown; findings below may reference images not displayed]

FINDINGS: Mammographic images were obtained following ultrasound guided biopsy
of right breast masses. Two-view mammography demonstrates presence
of ribbon shaped marker within the 11 o'clock mass and coil shaped
marker within the 1 o'clock mass. Both markers are in expected
position. Post biopsy changes are seen.
IMPRESSION: Successful placement of post biopsy markers, post ultrasound-guided
core needle biopsy of the right breast:

11 o'clock--ribbon shaped marker

1 o'clock--coil shaped marker.

Final Assessment: Post Procedure Mammograms for Marker Placement

## 2019-09-04 IMAGING — MG DIGITAL DIAGNOSTIC BILATERAL MAMMOGRAM WITH TOMO AND CAD
6 of 10 series · 6 of 30 positions shown · non-contrast
Comparison: None.

CLINICAL DATA: Patient had a recent CT scan of the abdomen and
pelvis showing a suspicious mass in the right breast.

EXAM:
DIGITAL DIAGNOSTIC BILATERAL MAMMOGRAM WITH CAD AND TOMO
ULTRASOUND BILATERAL BREAST

[R CC synth-2D]
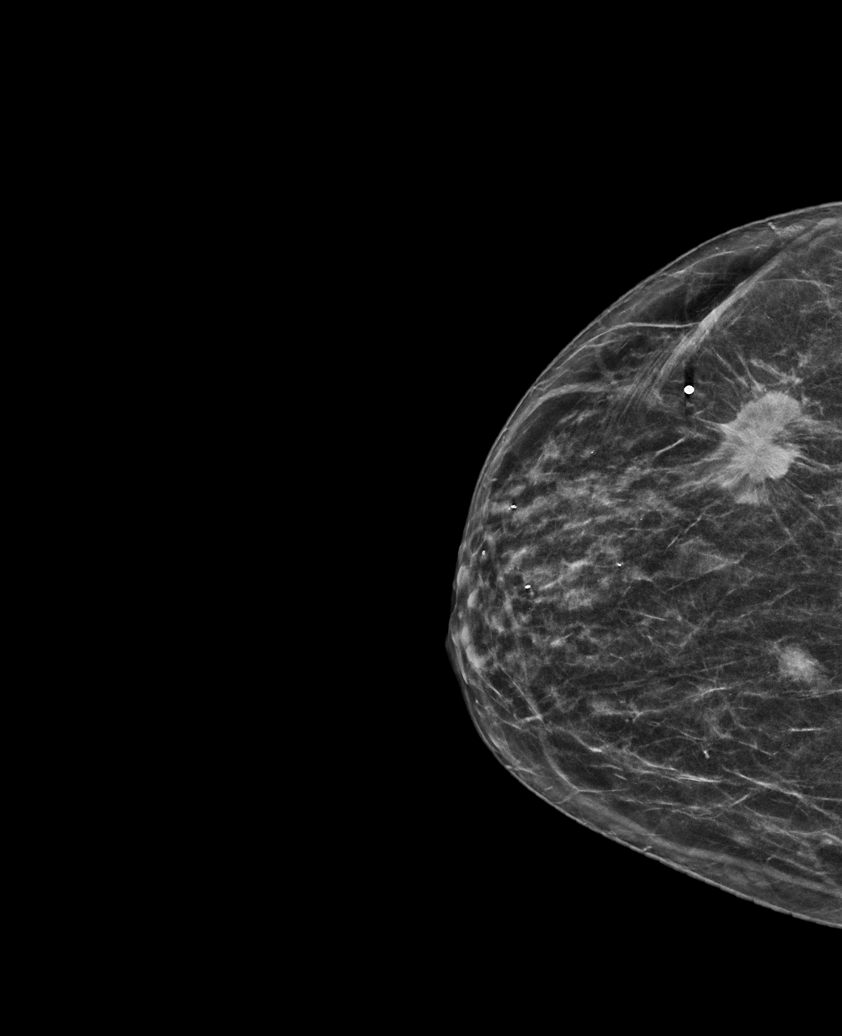

[L CC synth-2D]
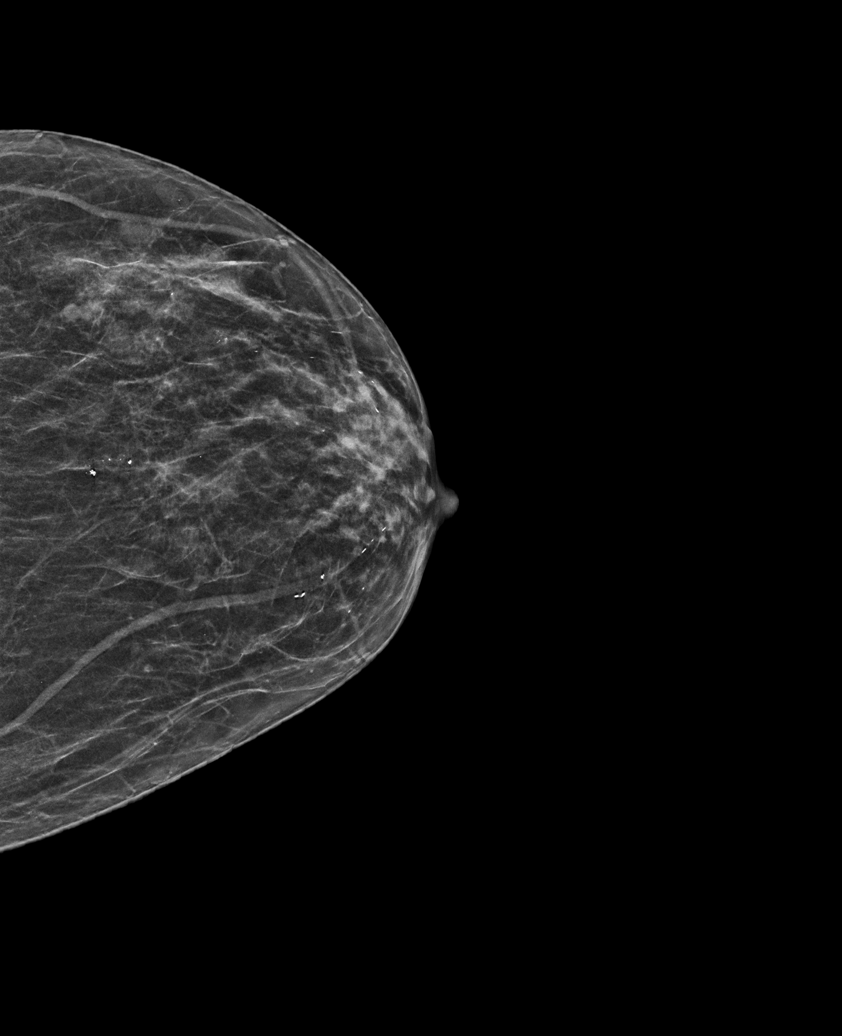

[L MLO synth-2D]
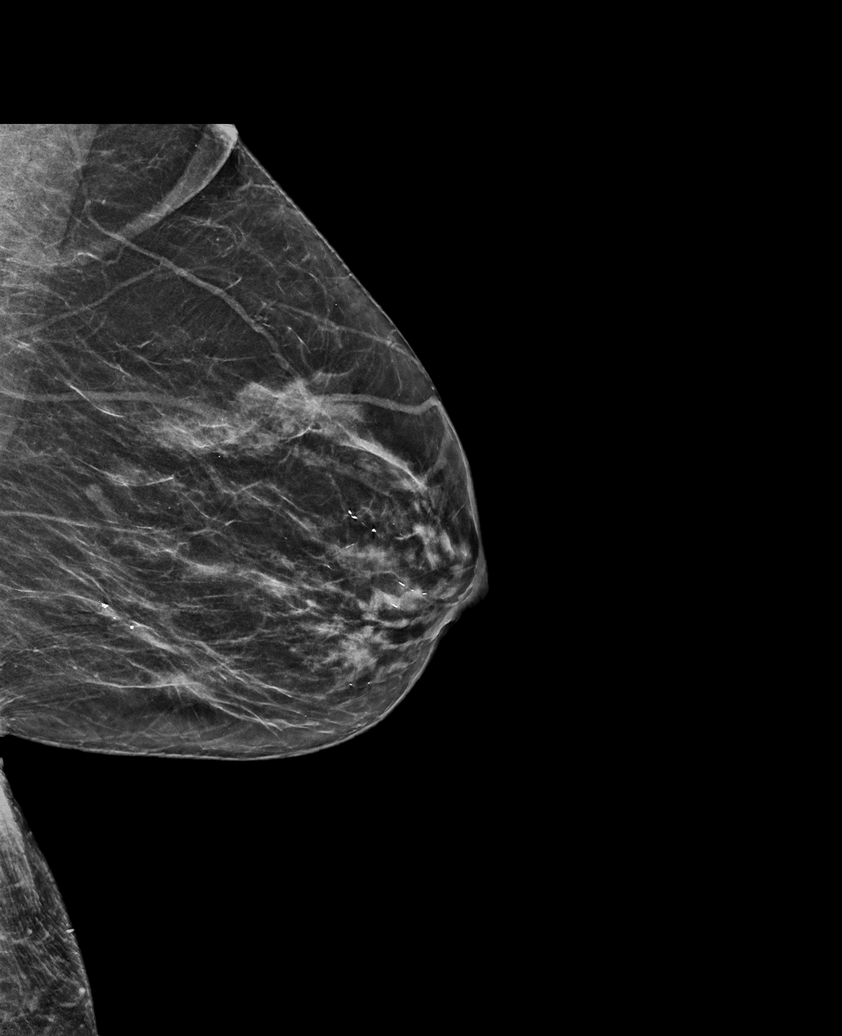

[R TAN synth-2D]
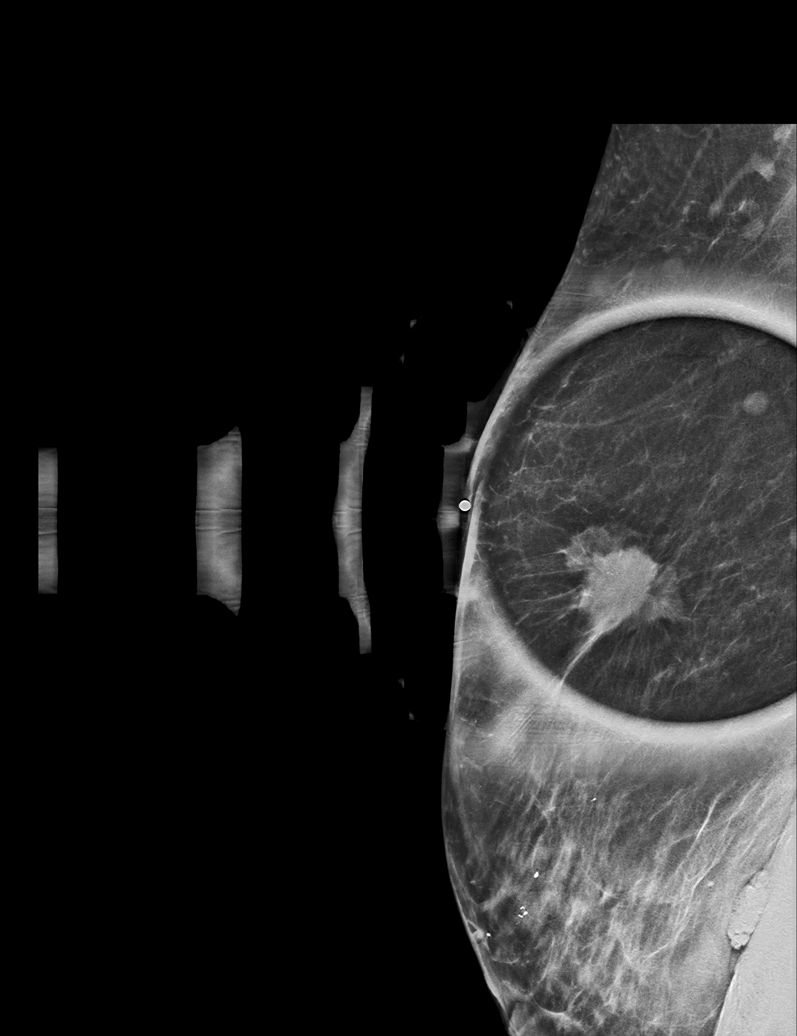

[R MLO synth-2D]
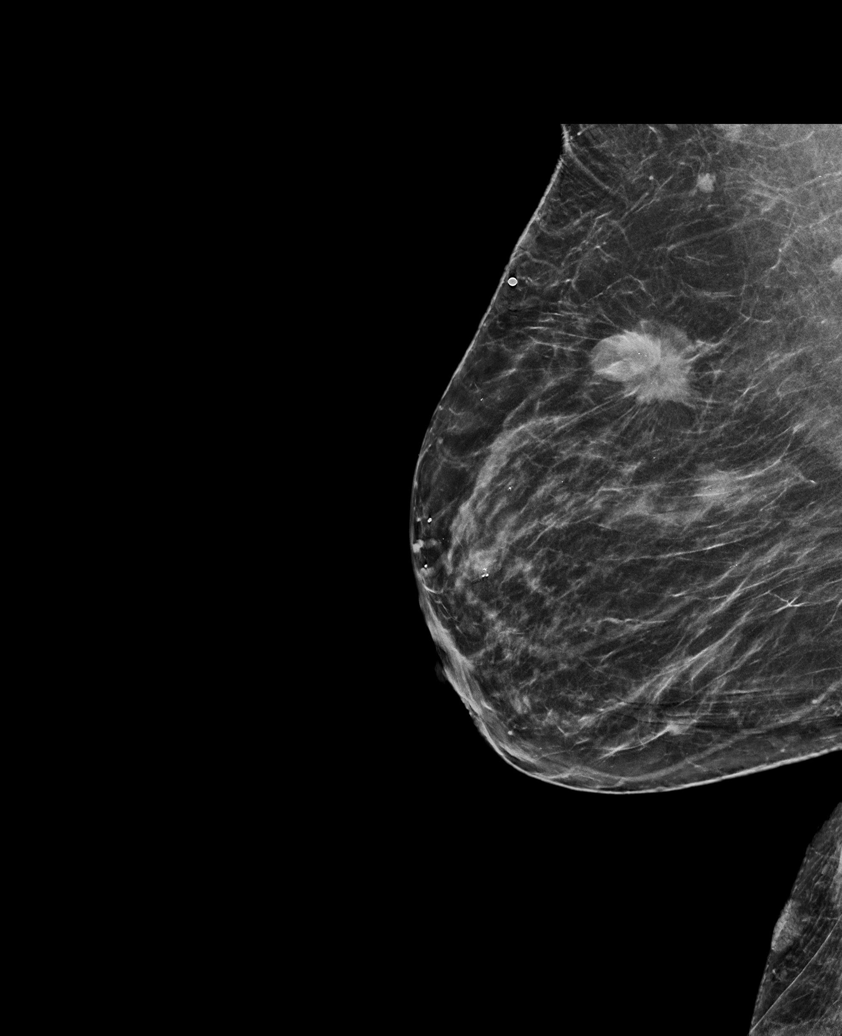

[L MLO tomo · tomo slice 31/60.0]
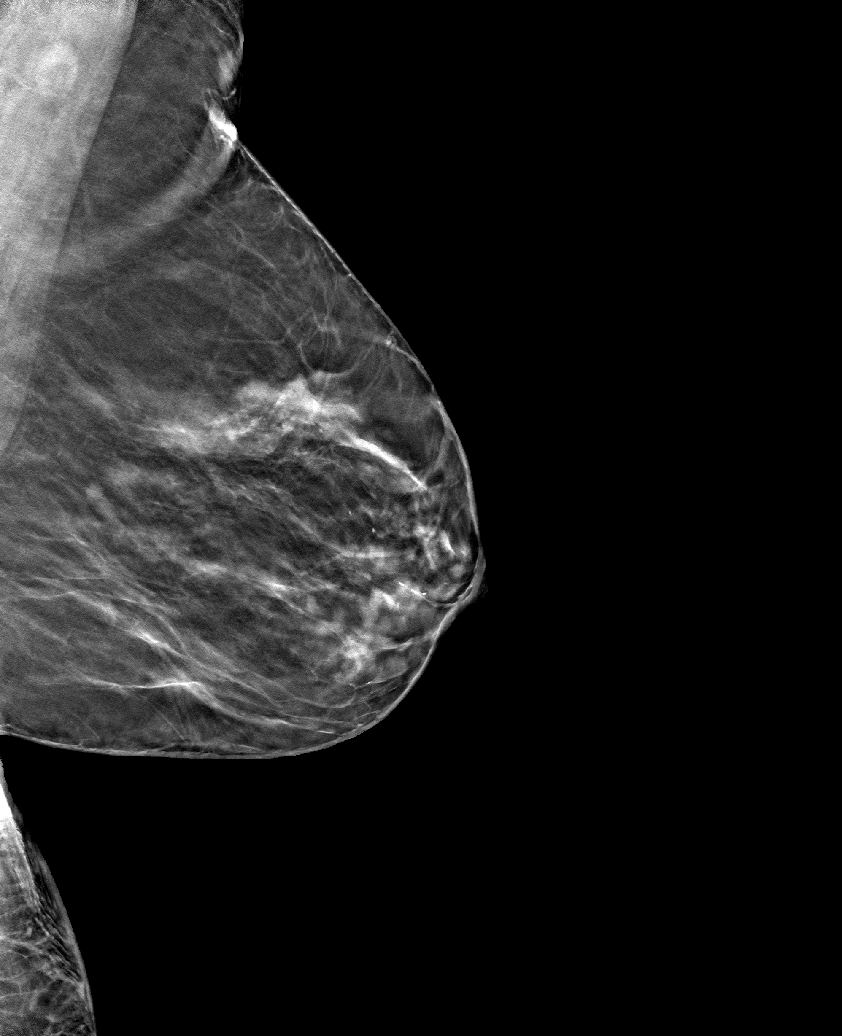

[6 of 30 positions shown; findings below may reference images not displayed]

ACR Breast Density Category b: There are scattered areas of
fibroglandular density.
FINDINGS: There is a 3.1 cm spiculated mass in the upper outer quadrant of the
right breast and a 1.4 cm mass in the upper inner quadrant of the
right breast. There is a 7 mm bilobed mass in the lateral aspect of
the left breast. There are no malignant type microcalcifications.

Mammographic images were processed with CAD.

On physical exam, I palpate a discrete mass in the right breast 11
o'clock 7 cm from the nipple. No mass is palpated in the lateral
aspect of the left breast.

Targeted ultrasound is performed, showing an irregular hypoechoic
mass in the right breast at 11 o'clock 7 cm from the nipple
measuring 1.7 x 2.2 x 2.1 cm. There is an adjacent anechoic cyst
measuring 1.2 cm. The mass and cyst together measure 2.9 cm. There
is a hypoechoic mass in the right breast at 1 o'clock 6 cm from the
nipple measuring 9 x 8 x 1.3 mm. The masses are located 3.9 cm
apart. Sonographic evaluation the right axilla does not show any
enlarged adenopathy.

Sonographic evaluation of the left breast shows an anechoic cyst at
[DATE] 7 cm from the nipple measuring 7 x 5 x 3 mm. There is an
anechoic cyst at 2 o'clock 7 cm from the nipple measuring 7 x 3 x 6
mm.
IMPRESSION: Two suspicious masses in the 11 o'clock and 1 o'clock region of the
right breast.

RECOMMENDATION:
Ultrasound-guided core biopsies of the right breast masses is
recommended.

I have discussed the findings and recommendations with the patient.
Results were also provided in writing at the conclusion of the
visit. If applicable, a reminder letter will be sent to the patient
regarding the next appointment.

BI-RADS CATEGORY  5: Highly suggestive of malignancy.

## 2019-09-05 ENCOUNTER — Ambulatory Visit: Payer: Medicare Other | Admitting: Licensed Clinical Social Worker

## 2019-09-12 ENCOUNTER — Ambulatory Visit: Payer: Medicare Other | Attending: Family Medicine

## 2019-09-12 ENCOUNTER — Encounter: Payer: Self-pay | Admitting: *Deleted

## 2019-09-12 ENCOUNTER — Other Ambulatory Visit: Payer: Self-pay

## 2019-09-12 DIAGNOSIS — M25552 Pain in left hip: Secondary | ICD-10-CM | POA: Diagnosis not present

## 2019-09-12 DIAGNOSIS — M25551 Pain in right hip: Secondary | ICD-10-CM

## 2019-09-12 DIAGNOSIS — R262 Difficulty in walking, not elsewhere classified: Secondary | ICD-10-CM | POA: Diagnosis present

## 2019-09-12 NOTE — Therapy (Deleted)
[facility] [address] [place], [address] Phone: [phone]   Fax:  [phone]  Physical Therapy Evaluation  Patient Details  Name: Kendra [name] MRN: [id number] Date of Birth: 14-Feb-1946 No data recorded  Encounter Date: 09/12/2019    Past Medical History:  Diagnosis Date  . Anxiety   . Breast cancer ([place]) 03/2018   right breast cancer at 11:00 and 1:00  . Bursitis    bilateral hips and knees  . Complication of anesthesia   . Depression   . Family history of breast cancer 03/24/2018  . Family history of lung cancer 03/24/2018  . History of alcohol dependence ([place]) 2007   no ETOH; resolved since 2007  . History of hiatal hernia   . History of psychosis 2014   due to sleep disturbance  . Hypertension   . Personal history of radiation therapy 06/2018-07/2018   right breast ca  . PONV (postoperative nausea and vomiting)     Past Surgical History:  Procedure Laterality Date  . BREAST BIOPSY Right 03/14/2018   11:00 DCIS and invasive ductal carcinoma  . BREAST BIOPSY Right 03/14/2018   1:00 Invasive ductal carcinoma  . BREAST LUMPECTOMY Right 04/27/2018   lumpectomy of 11 and 1:00 cancers, clear margins, negative LN  . BREAST LUMPECTOMY WITH RADIOACTIVE SEED AND SENTINEL LYMPH NODE BIOPSY Right 04/27/2018   Procedure: RIGHT BREAST LUMPECTOMY WITH RADIOACTIVE SEED X 2 AND RIGHT SENTINEL LYMPH NODE BIOPSY;  Surgeon: [name], MD;  Location: [facility];  Service: General;  Laterality: Right;  . CATARACT EXTRACTION W/PHACO Left 08/30/2019   Procedure: CATARACT EXTRACTION PHACO AND INTRAOCULAR LENS PLACEMENT ([place]) LEFT panoptix toric  01:20.5  12.7%  10.26;  Surgeon: Leandrew Koyanagi, MD;  Location: [facility];  Service: Ophthalmology;  Laterality: Left;  . LAPAROTOMY N/A 02/10/2018   Procedure: EXPLORATORY LAPAROTOMY;  Surgeon: Isabel Caprice, MD;  Location: [facility];  Service: Gynecology;  Laterality: N/A;  . MASS EXCISION  01/2018   abdominal  . OVARIAN CYST REMOVAL    . SALPINGOOPHORECTOMY Bilateral 02/10/2018   Procedure: BILATERAL SALPINGO OOPHORECTOMY; PERITONEAL WASHINGS;  Surgeon: Isabel Caprice, MD;  Location: [facility];  Service: Gynecology;  Laterality: Bilateral;  . TONSILLECTOMY    . TONSILLECTOMY AND ADENOIDECTOMY  1953    There were no vitals filed for this visit.   PAIN Location: L/R hip  5/10 current 7/10 worst 3/10 best  Irritability: high Nature: pain-dominant Stage: chronic Stability: worsening  Agg: sitting, standing, walking Ease: recliner 24-hour behavior: no change   SOCIAL HX Live: house (Stairs): does not use secondary to depth perception impairments 5-6 steps max. 5 steps to enter front door 3 back door Work: retired Systems analyst: read, walk, TV, cook   ADLs and MOBILITY Sit tol: reduced with neutral pelvic tilt Stand tol:  Walk tol:  Reach:  Sleep: interrupted, keeps her up sometimes.  Stairs:  Falls: no falls in last 6 months (prior falls prior due to visual deficits)  Red flags Wt loss: gained 20 lb (litrosol) Night pain: yes CA: yes, breast N/T: hip and down LLE, makes knee hurt, side and back of leg  GOALS: be able to move without hurting, walking 1 hr    POSTURE Sitting: mild slump  GAIT / MOBILITY / TRANSFERS Gait: Trendelenberg B mild R hip elevated Stairs: WNL Transfers: able to perform without significant compensation  OBSERVATION Edema mild BLE Hallux valgus R  PROM /  AROM / MMT  Lumbar ROM     Flex 75!  Ext. 75!  Side-bend R 75!  Side-bend L 100!  ROT R 100  ROT L 100  ! = Painful    Thoracic ROM MMT      Flex 100!   Ext. 100   L ROT 100   R ROT 100   ! = Painful  Thoracic flexon provokes LLE pain   HIP ROM  MMT    R L R L  Flex WNL WNL 4 4  Ext. WNL WNL 4- 4-  ABD WNL WNL 3* 3+*  ADD WNL WNL 3+ 3+  ER WNL WNL 4 4  IR limited limited    ! =  Painful   KNEE ROM  MMT    R L R L  Flex WNL WNL 5 5  Ext WNL WNL 5 5  ! = Painful    ANKLE ROM  MMT    R L R L  DF   4 4+  PF   * *  * Reduced strength evident   SOFT TISSUE LENGTH Hamstrings: limited grossly ~75 deg.  PAM CPA: mild hypomobility noted in lumbar spine Sacrum: WNL PSIS: WNL!  SPECIAL TESTS SLR + L Slump - Thomas test deferred Prone bent-knee deferred FABER + R/L Derotation: - Grind: - Quadrant testing: - Pelvis  Compression + R/L  PSIS: TTP 2+ R/L  Thigh thrust: + L  Distraction: n/t   PALPATION  TTP 3+:  TTP 2+: over greater troch B, over glute med, glute max, lumbar paraspinals TTP 1+: quadriceps insertion L, ASIS, PSIS B TTP 0+: quad muscle belly, adductors B, obliques   TREATMENT  Hooklying adduction isometric x 10 Glute stretch x 30 sec  Advised to reduce walking until further clinical assessment pending possible bursitis         Patient will benefit from skilled therapeutic intervention in order to improve the following deficits and impairments:     Visit Diagnosis: No diagnosis found.     Problem List Patient Active Problem List   Diagnosis Date Noted  . Avitaminosis D 08/24/2019  . Insomnia due to mental condition 05/31/2019  . Bipolar I disorder, most recent episode depressed ([place]) 05/31/2019  . Mild cognitive disorder 05/31/2019  . Alcohol use disorder, moderate, in sustained remission ([place]) 05/31/2019  . Elevated tumor markers 05/27/2019  . GAD (generalized anxiety disorder) 04/10/2019  . Altered mental status 04/10/2019  . MDD (major depressive disorder), severe ([place]) 01/31/2019  . High serum carbohydrate antigen 19-9 (CA19-9) 12/01/2018  . Adjustment disorder with anxious mood 11/24/2018  . Essential hypertension 11/24/2018  . Screening for lipid disorders 11/24/2018  . B12 deficiency 10/17/2018  . Incisional hernia, without obstruction or gangrene 09/12/2018  . Genetic testing 04/07/2018  . Family  history of breast cancer 03/24/2018  . Family history of lung cancer 03/24/2018  . Malignant neoplasm of upper-outer quadrant of right breast in female, estrogen receptor positive ([place]) 03/17/2018  . Malignant neoplasm of upper-inner quadrant of right breast in female, estrogen receptor positive ([place]) 03/17/2018  . History of alcohol dependence ([place]) 02/07/2018  . History of psychosis 02/07/2018  . History of insomnia 02/07/2018    Virgia Land, SPT 09/12/2019, 11:06 AM  [facility] [address], [place], [address] Phone: [phone]   Fax:  [phone]  Name: Suanne Damboise MRN: [id number] Date of Birth: [birth year]

## 2019-09-13 NOTE — Therapy (Signed)
[facility] [address] [place], [address] Phone: [phone]   Fax:  [phone]  Physical Therapy Evaluation  Patient Details  Name: Kendra [name] MRN: [id number] Date of Birth: 1946/02/14 No data recorded  Encounter Date: 09/12/2019  PT End of Session - 09/12/19 1216    Visit Number  1    Number of Visits  17    Date for PT Re-Evaluation  11/07/19    Authorization Type  0 / 10    PT Start Time  1102    PT Stop Time  1215    PT Time Calculation (min)  73 min    Activity Tolerance  Patient tolerated treatment well       Past Medical History:  Diagnosis Date  . Anxiety   . Breast cancer ([place]) 03/2018   right breast cancer at 11:00 and 1:00  . Bursitis    bilateral hips and knees  . Complication of anesthesia   . Depression   . Family history of breast cancer 03/24/2018  . Family history of lung cancer 03/24/2018  . History of alcohol dependence ([place]) 2007   no ETOH; resolved since 2007  . History of hiatal hernia   . History of psychosis 2014   due to sleep disturbance  . Hypertension   . Personal history of radiation therapy 06/2018-07/2018   right breast ca  . PONV (postoperative nausea and vomiting)     Past Surgical History:  Procedure Laterality Date  . BREAST BIOPSY Right 03/14/2018   11:00 DCIS and invasive ductal carcinoma  . BREAST BIOPSY Right 03/14/2018   1:00 Invasive ductal carcinoma  . BREAST LUMPECTOMY Right 04/27/2018   lumpectomy of 11 and 1:00 cancers, clear margins, negative LN  . BREAST LUMPECTOMY WITH RADIOACTIVE SEED AND SENTINEL LYMPH NODE BIOPSY Right 04/27/2018   Procedure: RIGHT BREAST LUMPECTOMY WITH RADIOACTIVE SEED X 2 AND RIGHT SENTINEL LYMPH NODE BIOPSY;  Surgeon: [name], MD;  Location: [facility];  Service: General;  Laterality: Right;  . CATARACT EXTRACTION W/PHACO Left 08/30/2019   Procedure: CATARACT EXTRACTION PHACO AND  INTRAOCULAR LENS PLACEMENT ([place]) LEFT panoptix toric  01:20.5  12.7%  10.26;  Surgeon: Leandrew Koyanagi, MD;  Location: [facility];  Service: Ophthalmology;  Laterality: Left;  . LAPAROTOMY N/A 02/10/2018   Procedure: EXPLORATORY LAPAROTOMY;  Surgeon: Isabel Caprice, MD;  Location: [facility];  Service: Gynecology;  Laterality: N/A;  . MASS EXCISION  01/2018   abdominal  . OVARIAN CYST REMOVAL    . SALPINGOOPHORECTOMY Bilateral 02/10/2018   Procedure: BILATERAL SALPINGO OOPHORECTOMY; PERITONEAL WASHINGS;  Surgeon: Isabel Caprice, MD;  [facility]: [facility];  Service: Gynecology;  Laterality: Bilateral;  . TONSILLECTOMY    . TONSILLECTOMY AND ADENOIDECTOMY  1953    There were no vitals filed for this visit.   Subjective Assessment - 09/12/19 1115    Subjective  Patient is a 73 yo female reporting with referral for B hip pain. Patient reports living at home with husband but does not use the steps due to depth perception issues. She is retired and enjoys reading, walking, and cooking.    Pertinent History  Patient report unclear regarding onset. Visited MD on Sept 16 and received injection with relief for approximately 1 month. Reports she cannot receive an additional injection pending eye surgery. Received dx from MD of hip bursitis in B hips. Reports pain with walking, stanindg, sitting 5/10 current 7/10 worst 3/10 best,  with temporary relief when she is sitting "just right" and with naproxen. Agg: activity. Cormorbidities: hx of breast cancer, HTN, MCI, insomnia, cataracts    Limitations  Sitting;Standing;Walking;House hold activities    How long can you sit comfortably?  Pain constant, reduced with neutral pelvic tilt    How long can you stand comfortably?  Pain constant, increases with time    How long can you walk comfortably?  Pain constant, increases with time    Patient Stated Goals  Be able to move without hurting, walk 1 hour    Currently in Pain?  Yes    Pain Score  5     Pain  Location  Hip    Pain Orientation  Right;Left    Pain Descriptors / Indicators  Aching;Constant;Sharp    Pain Type  Chronic pain    Pain Onset  More than a month ago    Pain Frequency  Constant    Aggravating Factors   Movement    Pain Relieving Factors  Sitting "just right"    Effect of Pain on Daily Activities  Reduced activity tolerance    Multiple Pain Sites  No          There were no vitals filed for this visit.   PAIN Location: L/R hip  5/10 current 7/10 worst 3/10 best  Irritability: high Nature: pain-dominant Stage: chronic Stability: worsening  Agg: sitting, standing, walking Ease: recliner 24-hour behavior: no change   SOCIAL HX Live: house (Stairs): does not use secondary to depth perception impairments 5-6 steps max. 5 steps to enter front door 3 back door Work: retired Systems analyst: read, walk, TV, cook   ADLs and MOBILITY Sit tol: reduced with neutral pelvic tilt Stand tol:  Walk tol:  Reach:  Sleep: interrupted, keeps her up sometimes.  Stairs:  Falls: no falls in last 6 months (prior falls prior due to visual deficits)  Red flags Wt loss: gained 20 lb (litrosol) Night pain: yes CA: yes, breast N/T: hip and down LLE, makes knee hurt, side and back of leg  GOALS: be able to move without hurting, walking 1 hr    POSTURE Sitting: mild slump  GAIT / MOBILITY / TRANSFERS Gait: Trendelenberg B mild R hip elevated Stairs: WNL Transfers: able to perform without significant compensation  OBSERVATION Edema mild BLE Hallux valgus R  PROM / AROM / MMT  Lumbar ROM     Flex 75!  Ext. 75!  Side-bend R 75!  Side-bend L 100!  ROT R 100  ROT L 100  ! = Painful    Thoracic ROM MMT      Flex 100!   Ext. 100   L ROT 100   R ROT 100   ! = Painful  Thoracic flexon provokes LLE pain   HIP ROM  MMT    R L R L  Flex WNL WNL 4 4  Ext. WNL WNL 4- 4-  ABD WNL WNL 3* 3+*  ADD WNL WNL 3+ 3+  ER WNL WNL 4 4  IR limited limited    ! =  Painful   KNEE ROM  MMT    R L R L  Flex WNL WNL 5 5  Ext WNL WNL 5 5  ! = Painful    ANKLE ROM  MMT    R L R L  DF   4 4+  PF   * *  * Reduced strength evident   SOFT TISSUE LENGTH Hamstrings: limited grossly ~  75 deg.  PAM CPA: mild hypomobility noted in lumbar spine Sacrum: WNL PSIS: WNL!  SPECIAL TESTS SLR + L Slump - Thomas test deferred Prone bent-knee deferred FABER + R/L Derotation: - Grind: - Quadrant testing: - Pelvis  Compression + R/L  PSIS: TTP 2+ R/L  Thigh thrust: + L  Distraction: n/t   PALPATION  TTP 3+:  TTP 2+: over greater troch B, over glute med, glute max, lumbar paraspinals TTP 1+: quadriceps insertion L, ASIS, PSIS B TTP 0+: quad muscle belly, adductors B, obliques   TREATMENT  Hooklying adduction isometric x 10 Glute stretch x 30 sec  Advised to reduce walking until further clinical assessment pending possible bursitis            PT Education - 09/13/19 1557    Education Details  Form/technique with exercises; adductor hip activation with pillow, gluteal stretch in supine    Person(s) Educated  Patient    Methods  Explanation;Demonstration;Handout    Comprehension  Verbalized understanding;Returned demonstration       PT Short Term Goals - 09/12/19 1419      PT SHORT TERM GOAL #1   Title  Patient will demonstrate complaince with HEP to speed recovery and reduce total number of visits.    Baseline  HEP given    Time  2    Period  Weeks    Status  New    Target Date  09/27/19        PT Long Term Goals - 09/13/19 1419      PT LONG TERM GOAL #1   Title  Patient will improve hip strength to 4/5 grossly to facilitate improved mobility with ADLs.    Baseline  3, 4-    Time  8    Period  Weeks    Status  New    Target Date  11/08/19      PT LONG TERM GOAL #2   Title  Patient will demonstrate walking tolerance increased to 30 minutes without increase in symptoms to demonstrate improved ability to perform  walking program for cardiovascular health.    Baseline  10 minute tolerance (patient report)    Time  8    Period  Weeks    Status  New    Target Date  11/08/19      PT LONG TERM GOAL #3   Title  Patient will report worst pain of 3/10 during therapy to demonstrate reduced disability    Baseline  Worst 7/10    Time  8    Period  Weeks    Status  New    Target Date  11/08/19      PT LONG TERM GOAL #4   Title  Patient will demonstrate ability to perform all bed mobility maneuvers without increase in pain for reduced disability with mobility and transfers with ADLs.    Baseline  Reports pain with bed mobility supine <> sidelying L/R, sidelying to sitting EOB    Time  8    Period  Weeks    Status  New    Target Date  11/08/19             Plan - 09/12/19 1700    Clinical Impression Statement  Patient is an anxious-appearing 73 yo female reporting with referral for B hip bursitis. Evaluation complicated by inconsistencies in history taking and pain response consistent with prior diagnosis of cognitive impairment. Hip weakness evident in gait analysis and MMT most markedly in  abductors and adductors. Special tests ambiguous for SIJ pathology, lumbar radiculopathy; negative for intra-articular pathology. Clinical impression: pain and weakness in bilateral hips consistent with bursitis, possibly secondary to increase in activity following commencement of walking program. Patient will benefit from skilled physical therapy to reduce pain and restore PLOF.    Personal Factors and Comorbidities  Age;Comorbidity 3+;Time since onset of injury/illness/exacerbation    Comorbidities  MCI, HTN, CA, cataracts    Examination-Activity Limitations  Bend;Sit;Squat;Stairs;Stand;Sleep    Examination-Participation Restrictions  Cleaning;Community Activity    Stability/Clinical Decision Making  Evolving/Moderate complexity    Clinical Decision Making  Moderate    Rehab Potential  Fair    Clinical  Impairments Affecting Rehab Potential  MCI    PT Frequency  2x / week   Eval and 1 f/u visit   PT Treatment/Interventions  ADLs/Self Care Home Management;Therapeutic exercise;Patient/family education;Neuromuscular re-education;Therapeutic activities;Functional mobility training;Balance training;Manual techniques;Gait training;Stair training;Moist Heat;Electrical Stimulation;Cryotherapy;Taping;Dry needling;Energy conservation;Passive range of motion;Joint Manipulations    PT Next Visit Plan  Initiate hip mobilization/therex    PT Home Exercise Plan  Glute stretch, adductor squeeze    Consulted and Agree with Plan of Care  Patient       Patient will benefit from skilled therapeutic intervention in order to improve the following deficits and impairments:  Decreased range of motion, Postural dysfunction, Pain, Abnormal gait, Decreased activity tolerance, Decreased coordination, Decreased endurance, Decreased balance, Decreased mobility, Difficulty walking, Hypomobility, Increased muscle spasms, Impaired perceived functional ability, Decreased strength, Impaired flexibility  Visit Diagnosis: Pain in left hip  Pain in right hip  Difficulty in walking, not elsewhere classified     Problem List Patient Active Problem List   Diagnosis Date Noted  . Avitaminosis D 08/24/2019  . Insomnia due to mental condition 05/31/2019  . Bipolar I disorder, most recent episode depressed ([place]) 05/31/2019  . Mild cognitive disorder 05/31/2019  . Alcohol use disorder, moderate, in sustained remission ([place]) 05/31/2019  . Elevated tumor markers 05/27/2019  . GAD (generalized anxiety disorder) 04/10/2019  . Altered mental status 04/10/2019  . MDD (major depressive disorder), severe ([place]) 01/31/2019  . High serum carbohydrate antigen 19-9 (CA19-9) 12/01/2018  . Adjustment disorder with anxious mood 11/24/2018  . Essential hypertension 11/24/2018  . Screening for lipid disorders 11/24/2018  . B12 deficiency  10/17/2018  . Incisional hernia, without obstruction or gangrene 09/12/2018  . Genetic testing 04/07/2018  . Family history of breast cancer 03/24/2018  . Family history of lung cancer 03/24/2018  . Malignant neoplasm of upper-outer quadrant of right breast in female, estrogen receptor positive ([place]) 03/17/2018  . Malignant neoplasm of upper-inner quadrant of right breast in female, estrogen receptor positive ([place]) 03/17/2018  . History of alcohol dependence ([place]) 02/07/2018  . History of psychosis 02/07/2018  . History of insomnia 02/07/2018    Virgia Land, SPT 09/13/2019, 3:58 PM  [facility] [address] [place], [address] Phone: [phone]   Fax:  [phone]  Name: Kendra [name] MRN: [id number] Date of Birth: Jun 13, 1946

## 2019-09-14 ENCOUNTER — Ambulatory Visit: Payer: Medicare Other

## 2019-09-14 ENCOUNTER — Other Ambulatory Visit: Payer: Self-pay

## 2019-09-14 DIAGNOSIS — M25552 Pain in left hip: Secondary | ICD-10-CM | POA: Diagnosis not present

## 2019-09-14 DIAGNOSIS — M25551 Pain in right hip: Secondary | ICD-10-CM

## 2019-09-14 DIAGNOSIS — R262 Difficulty in walking, not elsewhere classified: Secondary | ICD-10-CM

## 2019-09-14 NOTE — Therapy (Signed)
[facility] [address], [place], [address] Phone: [phone]   Fax:  [phone]  Physical Therapy Treatment  Patient Details  Name: Kendra [name] MRN: [id number] Date of Birth: 10-21-1946 No data recorded  Encounter Date: 09/14/2019  PT End of Session - 09/14/19 1713    Visit Number  2    Number of Visits  17    Date for PT Re-Evaluation  11/07/19    Authorization Type  2 / 10    PT Start Time  [id number]    PT Stop Time  1630    PT Time Calculation (min)  45 min    Activity Tolerance  Patient tolerated treatment well;No increased pain       Past Medical History:  Diagnosis Date  . Anxiety   . Breast cancer ([place]) 03/2018   right breast cancer at 11:00 and 1:00  . Bursitis    bilateral hips and knees  . Complication of anesthesia   . Depression   . Family history of breast cancer 03/24/2018  . Family history of lung cancer 03/24/2018  . History of alcohol dependence ([place]) 2007   no ETOH; resolved since 2007  . History of hiatal hernia   . History of psychosis 2014   due to sleep disturbance  . Hypertension   . Personal history of radiation therapy 06/2018-07/2018   right breast ca  . PONV (postoperative nausea and vomiting)     Past Surgical History:  Procedure Laterality Date  . BREAST BIOPSY Right 03/14/2018   11:00 DCIS and invasive ductal carcinoma  . BREAST BIOPSY Right 03/14/2018   1:00 Invasive ductal carcinoma  . BREAST LUMPECTOMY Right 04/27/2018   lumpectomy of 11 and 1:00 cancers, clear margins, negative LN  . BREAST LUMPECTOMY WITH RADIOACTIVE SEED AND SENTINEL LYMPH NODE BIOPSY Right 04/27/2018   Procedure: RIGHT BREAST LUMPECTOMY WITH RADIOACTIVE SEED X 2 AND RIGHT SENTINEL LYMPH NODE BIOPSY;  Surgeon: [name], MD;  Location: [facility];  Service: General;  Laterality: Right;  . CATARACT EXTRACTION W/PHACO Left 08/30/2019   Procedure: CATARACT EXTRACTION  PHACO AND INTRAOCULAR LENS PLACEMENT ([place]) LEFT panoptix toric  01:20.5  12.7%  10.26;  Surgeon: Leandrew Koyanagi, MD;  Location: [facility];  Service: Ophthalmology;  Laterality: Left;  . LAPAROTOMY N/A 02/10/2018   Procedure: EXPLORATORY LAPAROTOMY;  Surgeon: Isabel Caprice, MD;  [facility];  Service: Gynecology;  Laterality: N/A;  . MASS EXCISION  01/2018   abdominal  . OVARIAN CYST REMOVAL    . SALPINGOOPHORECTOMY Bilateral 02/10/2018   Procedure: BILATERAL SALPINGO OOPHORECTOMY; PERITONEAL WASHINGS;  Surgeon: Isabel Caprice, MD;  [facility];  Service: Gynecology;  Laterality: Bilateral;  . TONSILLECTOMY    . TONSILLECTOMY AND ADENOIDECTOMY  1953    There were no vitals filed for this visit.  Subjective Assessment - 09/14/19 1549    Subjective  Patient reports doing HEP and yoga. Reports that yoga made her back sore. Describes her pain as "like a toothache".    Pertinent History  Patient report unclear regarding onset. Visited MD on Sept 16 and received injection with relief for approximately 1 month. Reports she cannot receive an additional injection pending eye surgery. Received dx from MD of hip bursitis in B hips. Reports pain with walking, stanindg, sitting 5/10 current 7/10 worst 3/10 best, with temporary relief when she is sitting "just right" and with naproxen. Agg: activity. Cormorbidities: hx of breast cancer,  HTN, MCI, insomnia, cataracts    Limitations  Sitting;Standing;Walking;House hold activities    How long can you sit comfortably?  Pain constant, reduced with neutral pelvic tilt    How long can you stand comfortably?  Pain constant, increases with time    How long can you walk comfortably?  Pain constant, increases with time    Patient Stated Goals  Be able to move without hurting, walk 1 hour    Currently in Pain?  Yes    Pain Score  7     Pain Location  Hip    Pain Orientation  Right;Left    Pain Descriptors / Indicators   Aching;Constant;Sharp;Throbbing    Pain Type  Chronic pain    Pain Onset  More than a month ago    Multiple Pain Sites  No       TREATMENT    TE Review of HEP: pt demonstrates good recall Adductor squeezes Glute stretch Bridges with UE support 3 sec hold x 6 Bridges without UE support 3 sec hold x 6 SKTC stretch R/L x 3 LTRs x 20 L/R HS curls over ball x 20 Hooklying hip abduction red theraband x 20 Hip circumduction in hooklying x 20 Marches x 20 10MWT 0.88 m/sec 6 MWT 1220 ft  TE for reduction of pain and strengthening of B hips.  NMR Romberg OK -- 5 x 30 sec Sharpened Romberg OK -- 5 x 30sec  Tandem R/L pt can maintain balance for 10 sec with apprehension but has difficulty achieving stance independently requiring minA for balance -- 3 x 30 sec B  NMR for assessment of balance following therapy and to address patient c/o feeling unsteady on feet          PT Education - 09/14/19 1543    Education provided  Yes    Education Details  form/technique with exercise    Person(s) Educated  Patient    Methods  Explanation;Demonstration;Verbal cues    Comprehension  Verbalized understanding;Returned demonstration;Verbal cues required       PT Short Term Goals - 09/12/19 1419      PT SHORT TERM GOAL #1   Title  Patient will demonstrate complaince with HEP to speed recovery and reduce total number of visits.    Baseline  HEP given    Time  2    Period  Weeks    Status  New    Target Date  09/27/19        PT Long Term Goals - 09/14/19 1742      PT LONG TERM GOAL #1   Title  Patient will improve hip strength to 4/5 grossly to facilitate improved mobility with ADLs.    Baseline  3, 4-    Time  8    Period  Weeks    Status  New      PT LONG TERM GOAL #2   Title  Patient will demonstrate walking tolerance increased to 30 minutes without increase in symptoms to demonstrate improved ability to perform walking program for cardiovascular health.    Baseline   10 minute tolerance (patient report)    Time  8    Period  Weeks    Status  New      PT LONG TERM GOAL #3   Title  Patient will report worst pain of 3/10 during therapy to demonstrate reduced disability    Baseline  Worst 7/10    Time  8    Period  Weeks  Status  New      PT LONG TERM GOAL #4   Title  Patient will demonstrate ability to perform all bed mobility maneuvers without increase in pain for reduced disability with mobility and transfers with ADLs.    Baseline  Reports pain with bed mobility supine <> sidelying L/R, sidelying to sitting EOB    Time  8    Period  Weeks    Status  New      PT LONG TERM GOAL #5   Title  Patient will improve 10MW speed to 1.0 m/sec to meet norms for minimal fall risk and full community ambulation.    Baseline  0.88 m/sec    Time  8    Period  Weeks    Status  New    Target Date  11/09/19            Plan - 09/14/19 1737    Clinical Impression Statement  Patient tolerates gentle hip exercises well including 6 min walk without marked increase in symptoms. C/C with exercise is increase in peripatellar N/T onset after roughly 50 ft walking with mild irritability. Patient demonstrates good comprehension and recall of HEP and responds well to education on distinction between "sensation" and "pain". Patient demonstrates good progress overall. Plan to continue with graded exercise for alleviation of symptoms. Patient will benefit from skilled PT to reduce pain and restore PLOF.    Personal Factors and Comorbidities  Age;Comorbidity 3+;Time since onset of injury/illness/exacerbation    Comorbidities  MCI, HTN, CA, cataracts    Examination-Activity Limitations  Bend;Sit;Squat;Stairs;Stand;Sleep    Examination-Participation Restrictions  Cleaning;Community Activity    Stability/Clinical Decision Making  Evolving/Moderate complexity    Rehab Potential  Fair    Clinical Impairments Affecting Rehab Potential  MCI    PT Frequency  2x / week   Eval  and 1 f/u visit   PT Treatment/Interventions  ADLs/Self Care Home Management;Therapeutic exercise;Patient/family education;Neuromuscular re-education;Therapeutic activities;Functional mobility training;Balance training;Manual techniques;Gait training;Stair training;Moist Heat;Electrical Stimulation;Cryotherapy;Taping;Dry needling;Energy conservation;Passive range of motion;Joint Manipulations    PT Next Visit Plan  Progress hip therex    PT Home Exercise Plan  Glute stretch, adductor squeeze, walking 6-10 min    Consulted and Agree with Plan of Care  Patient       Patient will benefit from skilled therapeutic intervention in order to improve the following deficits and impairments:  Decreased range of motion, Postural dysfunction, Pain, Abnormal gait, Decreased activity tolerance, Decreased coordination, Decreased endurance, Decreased balance, Decreased mobility, Difficulty walking, Hypomobility, Increased muscle spasms, Impaired perceived functional ability, Decreased strength, Impaired flexibility  Visit Diagnosis: Pain in left hip  Pain in right hip  Difficulty in walking, not elsewhere classified     Problem List Patient Active Problem List   Diagnosis Date Noted  . Avitaminosis D 08/24/2019  . Insomnia due to mental condition 05/31/2019  . Bipolar I disorder, most recent episode depressed ([place]) 05/31/2019  . Mild cognitive disorder 05/31/2019  . Alcohol use disorder, moderate, in sustained remission ([place]) 05/31/2019  . Elevated tumor markers 05/27/2019  . GAD (generalized anxiety disorder) 04/10/2019  . Altered mental status 04/10/2019  . MDD (major depressive disorder), severe ([place]) 01/31/2019  . High serum carbohydrate antigen 19-9 (CA19-9) 12/01/2018  . Adjustment disorder with anxious mood 11/24/2018  . Essential hypertension 11/24/2018  . Screening for lipid disorders 11/24/2018  . B12 deficiency 10/17/2018  . Incisional hernia, without obstruction or gangrene 09/12/2018   . Genetic testing 04/07/2018  . Family history  of breast cancer 03/24/2018  . Family history of lung cancer 03/24/2018  . Malignant neoplasm of upper-outer quadrant of right breast in female, estrogen receptor positive ([place]) 03/17/2018  . Malignant neoplasm of upper-inner quadrant of right breast in female, estrogen receptor positive ([place]) 03/17/2018  . History of alcohol dependence ([place]) 02/07/2018  . History of psychosis 02/07/2018  . History of insomnia 02/07/2018    Virgia Land, SPT 09/14/2019, 5:46 PM  [facility] [address] [place], [address] Phone: [phone]   Fax:  [phone]  Name: Caitland Weeman MRN: [id number] Date of Birth: [birth year]

## 2019-09-15 ENCOUNTER — Encounter: Payer: Self-pay | Admitting: Family Medicine

## 2019-09-15 ENCOUNTER — Other Ambulatory Visit: Payer: Self-pay

## 2019-09-15 ENCOUNTER — Telehealth: Payer: Self-pay | Admitting: Family Medicine

## 2019-09-15 ENCOUNTER — Other Ambulatory Visit
Admission: RE | Admit: 2019-09-15 | Discharge: 2019-09-15 | Disposition: A | Discharge status: Home / Self Care | Payer: Medicare Other | Source: Ambulatory Visit | Attending: Ophthalmology | Admitting: Ophthalmology

## 2019-09-15 DIAGNOSIS — Z20822 Contact with and (suspected) exposure to covid-19: Secondary | ICD-10-CM

## 2019-09-15 DIAGNOSIS — Z01812 Encounter for preprocedural laboratory examination: Secondary | ICD-10-CM | POA: Diagnosis present

## 2019-09-15 DIAGNOSIS — Z20828 Contact with and (suspected) exposure to other viral communicable diseases: Secondary | ICD-10-CM | POA: Insufficient documentation

## 2019-09-15 NOTE — Telephone Encounter (Signed)
Pt will be missing her PT appt on 09/19/19. She will be quarantined for eye surgery 09/20/19. Will be checked 1 day after surgery and then 8 week after surgery.   Will need a note from Dr. Jacinto Reap. saying she can come back to PT for her PT appt on 11/ 23/20. Pt would like the note mailed to her home address.  Please call pt back at [phone] to let her know on this.  Thanks, [employer]

## 2019-09-15 NOTE — Telephone Encounter (Signed)
Letter completed and can be mailed to patient's home address

## 2019-09-16 LAB — SARS CORONAVIRUS 2 (TAT 6-24 HRS): SARS Coronavirus 2: NEGATIVE

## 2019-09-18 NOTE — Discharge Instructions (Signed)

## 2019-09-19 ENCOUNTER — Telehealth: Payer: Self-pay

## 2019-09-19 NOTE — Telephone Encounter (Signed)
She should ask her surgeon how soon she can have steroids after her surgery

## 2019-09-19 NOTE — Telephone Encounter (Signed)
Patient advised as below.  

## 2019-09-19 NOTE — Telephone Encounter (Signed)
Please advise.sd Copied from [place] [id number]. Topic: General - Inquiry >> Sep 19, 2019 10:48 AM Selinda Flavin B, NT wrote: Reason for CRM: Patient calling and would like to know when she would be able to get a prednisone injection for hip bursitis. States that she is having eye surgery tomorrow 09/20/2019 and would like to know when that could be done after the surgery. Please advise.

## 2019-09-20 ENCOUNTER — Ambulatory Visit: Payer: Medicare Other | Admitting: Anesthesiology

## 2019-09-20 ENCOUNTER — Encounter
Admission: RE | Disposition: A | Discharge status: Home / Self Care | Payer: Self-pay | Source: Home / Self Care | Attending: Ophthalmology

## 2019-09-20 ENCOUNTER — Ambulatory Visit
Admission: RE | Admit: 2019-09-20 | Discharge: 2019-09-20 | Disposition: A | Discharge status: Home / Self Care | Payer: Medicare Other | Attending: Ophthalmology | Admitting: Ophthalmology

## 2019-09-20 ENCOUNTER — Other Ambulatory Visit: Payer: Self-pay

## 2019-09-20 DIAGNOSIS — F419 Anxiety disorder, unspecified: Secondary | ICD-10-CM | POA: Diagnosis present

## 2019-09-20 DIAGNOSIS — Z91013 Allergy to seafood: Secondary | ICD-10-CM | POA: Diagnosis not present

## 2019-09-20 DIAGNOSIS — Z853 Personal history of malignant neoplasm of breast: Secondary | ICD-10-CM | POA: Diagnosis not present

## 2019-09-20 DIAGNOSIS — I10 Essential (primary) hypertension: Secondary | ICD-10-CM | POA: Diagnosis not present

## 2019-09-20 DIAGNOSIS — Z888 Allergy status to other drugs, medicaments and biological substances status: Secondary | ICD-10-CM | POA: Diagnosis not present

## 2019-09-20 DIAGNOSIS — F319 Bipolar disorder, unspecified: Secondary | ICD-10-CM | POA: Diagnosis not present

## 2019-09-20 DIAGNOSIS — H2511 Age-related nuclear cataract, right eye: Secondary | ICD-10-CM | POA: Diagnosis not present

## 2019-09-20 DIAGNOSIS — K449 Diaphragmatic hernia without obstruction or gangrene: Secondary | ICD-10-CM | POA: Insufficient documentation

## 2019-09-20 HISTORY — PX: CATARACT EXTRACTION W/PHACO: SHX586

## 2019-09-20 SURGERY — PHACOEMULSIFICATION, CATARACT, WITH IOL INSERTION
Anesthesia: Monitor Anesthesia Care | Site: Eye | Laterality: Right

## 2019-09-20 MED ORDER — ACETAMINOPHEN 325 MG PO TABS: 325.0000 mg | ORAL_TABLET | ORAL | Status: DC | PRN

## 2019-09-20 MED ORDER — EPINEPHRINE PF 1 MG/ML IJ SOLN
INTRAOCULAR | Status: DC | PRN
  Administered 2019-09-20: 61 mL via OPHTHALMIC

## 2019-09-20 MED ORDER — MIDAZOLAM HCL 2 MG/2ML IJ SOLN
INTRAMUSCULAR | Status: DC | PRN
  Administered 2019-09-20: 2 mg via INTRAVENOUS

## 2019-09-20 MED ORDER — TETRACAINE HCL 0.5 % OP SOLN
1.0000 [drp] | OPHTHALMIC | Status: DC | PRN
  Administered 2019-09-20 (×3): 1 [drp] via OPHTHALMIC

## 2019-09-20 MED ORDER — NA HYALUR & NA CHOND-NA HYALUR 0.4-0.35 ML IO KIT
PACK | INTRAOCULAR | Status: DC | PRN
  Administered 2019-09-20: 1 mL via INTRAOCULAR

## 2019-09-20 MED ORDER — ARMC OPHTHALMIC DILATING DROPS
1.0000 "application " | OPHTHALMIC | Status: DC | PRN
  Administered 2019-09-20 (×3): 1 via OPHTHALMIC

## 2019-09-20 MED ORDER — CEFUROXIME OPHTHALMIC INJECTION 1 MG/0.1 ML
INJECTION | OPHTHALMIC | Status: DC | PRN
  Administered 2019-09-20: 0.1 mL via INTRACAMERAL

## 2019-09-20 MED ORDER — ACETAMINOPHEN 160 MG/5ML PO SOLN: 325.0000 mg | ORAL | Status: DC | PRN

## 2019-09-20 MED ORDER — LIDOCAINE HCL (PF) 2 % IJ SOLN
INTRAOCULAR | Status: DC | PRN
  Administered 2019-09-20: 1 mL

## 2019-09-20 MED ORDER — BRIMONIDINE TARTRATE-TIMOLOL 0.2-0.5 % OP SOLN
OPHTHALMIC | Status: DC | PRN
  Administered 2019-09-20: 1 [drp] via OPHTHALMIC

## 2019-09-20 MED ORDER — FENTANYL CITRATE (PF) 100 MCG/2ML IJ SOLN
INTRAMUSCULAR | Status: DC | PRN
  Administered 2019-09-20 (×2): 50 ug via INTRAVENOUS

## 2019-09-20 MED ORDER — MOXIFLOXACIN HCL 0.5 % OP SOLN
1.0000 [drp] | OPHTHALMIC | Status: DC | PRN
  Administered 2019-09-20 (×3): 1 [drp] via OPHTHALMIC

## 2019-09-20 SURGICAL SUPPLY — 18 items
CANNULA ANT/CHMB 27G (MISCELLANEOUS) ×1 IMPLANT
CANNULA ANT/CHMB 27GA (MISCELLANEOUS) ×2 IMPLANT
GLOVE SURG LX 7.5 STRW (GLOVE) ×1
GLOVE SURG LX STRL 7.5 STRW (GLOVE) ×1 IMPLANT
GLOVE SURG TRIUMPH 8.0 PF LTX (GLOVE) ×2 IMPLANT
GOWN STRL REUS W/ TWL LRG LVL3 (GOWN DISPOSABLE) ×2 IMPLANT
GOWN STRL REUS W/TWL LRG LVL3 (GOWN DISPOSABLE) ×2
LENS IOL IQ PAN TRC 50 15.5 IMPLANT
LENS IOL PANOP TORIC 15.5 ×1 IMPLANT
LENS IOL PANOPTIX TORIC 15.5 ×1 IMPLANT
MARKER SKIN DUAL TIP RULER LAB (MISCELLANEOUS) ×2 IMPLANT
PACK CATARACT BRASINGTON (MISCELLANEOUS) ×2 IMPLANT
PACK EYE AFTER SURG (MISCELLANEOUS) ×2 IMPLANT
PACK OPTHALMIC (MISCELLANEOUS) ×2 IMPLANT
SYR 3ML LL SCALE MARK (SYRINGE) ×2 IMPLANT
SYR TB 1ML LUER SLIP (SYRINGE) ×2 IMPLANT
WATER STERILE IRR 500ML POUR (IV SOLUTION) ×2 IMPLANT
WIPE NON LINTING 3.25X3.25 (MISCELLANEOUS) ×2 IMPLANT

## 2019-09-20 NOTE — Op Note (Signed)
LOCATION:  [facility]   PREOPERATIVE DIAGNOSIS:  Nuclear sclerotic cataract of the right eye.  H25.11   POSTOPERATIVE DIAGNOSIS:  Nuclear sclerotic cataract of the right eye.   PROCEDURE:  Phacoemulsification with Toric posterior chamber intraocular lens placement of the right eye.  Ultrasound time: Procedure(s): CATARACT EXTRACTION PHACO AND INTRAOCULAR LENS PLACEMENT (IOC) RIGHT 5.71  00:36.4  15.7% (Right)  LENS:   Implant Name Type Inv. Item Serial No. Manufacturer Lot No. LRB No. Used Action  AcrySof IQ PanOptix Toric IOL Intraocular Lens  [id number]   Right 1 Implanted     [id number] 15.0 Panoptix Toric intraocular lens with 3.0 diopters of cylindrical power with axis orientation at 85 degrees.   SURGEON:  Wyonia Hough, MD   ANESTHESIA: Topical with tetracaine drops and 2% Xylocaine jelly, augmented with 1% preservative-free intracameral lidocaine. .   COMPLICATIONS:  None.   DESCRIPTION OF PROCEDURE:  The patient was identified in the holding room and transported to the operating suite and placed in the supine position under the operating microscope.  The right eye was identified as the operative eye, and it was prepped and draped in the usual sterile ophthalmic fashion.    A clear-corneal paracentesis incision was made at the 12:00 position.  0.5 ml of preservative-free 1% lidocaine was injected into the anterior chamber. The anterior chamber was filled with Viscoat.  A 2.4 millimeter near clear corneal incision was then made at the 9:00 position.  A cystotome and capsulorrhexis forceps were then used to make a curvilinear capsulorrhexis.  Hydrodissection and hydrodelineation were then performed using balanced salt solution.   Phacoemulsification was then used in stop and chop fashion to remove the lens, nucleus and epinucleus.  The remaining cortex was aspirated using the irrigation and aspiration handpiece.  Provisc viscoelastic was then placed into the capsular  bag to distend it for lens placement.  The Verion digital marker was used to align the implant at the intended axis.   A Toric lens was then injected into the capsular bag.  It was rotated clockwise until the axis marks on the lens were approximately 15 degrees in the counterclockwise direction to the intended alignment.  The viscoelastic was aspirated from the eye using the irrigation aspiration handpiece.  Then, a Koch spatula through the sideport incision was used to rotate the lens in a clockwise direction until the axis markings of the intraocular lens were lined up with the Verion alignment.  Balanced salt solution was then used to hydrate the wounds. Cefuroxime 0.1 ml of a 10mg /ml solution was injected into the anterior chamber for a dose of 1 mg of intracameral antibiotic at the completion of the case.    The eye was noted to have a physiologic pressure and there was no wound leak noted.   Timolol and Brimonidine drops were applied to the eye.  The patient was taken to the recovery room in stable condition having had no complications of anesthesia or surgery.  Terril Chestnut 09/20/2019, 12:15 PM

## 2019-09-20 NOTE — Anesthesia Procedure Notes (Signed)
Procedure Name: MAC Date/Time: 09/20/2019 11:58 AM Performed by: Jeannene Patella, CRNA Pre-anesthesia Checklist: Patient identified, Emergency Drugs available, Suction available, Patient being monitored and Timeout performed Patient Re-evaluated:Patient Re-evaluated prior to induction Oxygen Delivery Method: Nasal cannula

## 2019-09-20 NOTE — Transfer of Care (Signed)
Immediate Anesthesia Transfer of Care Note  Patient: Kendra [name]  Procedure(s) Performed: CATARACT EXTRACTION PHACO AND INTRAOCULAR LENS PLACEMENT (IOC) RIGHT (Right Eye)  Patient Location: PACU  Anesthesia Type: MAC  Level of Consciousness: awake, alert  and patient cooperative  Airway and Oxygen Therapy: Patient Spontanous Breathing and Patient connected to supplemental oxygen  Post-op Assessment: Post-op Vital signs reviewed, Patient's Cardiovascular Status Stable, Respiratory Function Stable, Patent Airway and No signs of Nausea or vomiting  Post-op Vital Signs: Reviewed and stable  Complications: No apparent anesthesia complications

## 2019-09-20 NOTE — Anesthesia Postprocedure Evaluation (Signed)
Anesthesia Post Note  Patient: Amberlee Keach  Procedure(s) Performed: CATARACT EXTRACTION PHACO AND INTRAOCULAR LENS PLACEMENT (IOC) RIGHT 5.71  00:36.4  15.7% (Right Eye)     Patient location during evaluation: PACU Anesthesia Type: MAC Level of consciousness: awake and alert Pain management: pain level controlled Vital Signs Assessment: post-procedure vital signs reviewed and stable Respiratory status: spontaneous breathing, nonlabored ventilation, respiratory function stable and patient connected to nasal cannula oxygen Cardiovascular status: stable and blood pressure returned to baseline Postop Assessment: no apparent nausea or vomiting Anesthetic complications: no    Trecia Rogers

## 2019-09-20 NOTE — H&P (Signed)
The History and Physical notes are on paper, have been signed, and are to be scanned. The patient remains stable and unchanged from the H&P.   Previous H&P reviewed, patient examined, and there are no changes.  The patient has a visually significant cataract interfering with his or her vision.  I attest that the following are true and accurate to the best of my knowledge: 1. The patient's impairment of visual function is believed not to be correctable with a tolerable change in glasses or contact lenses. 2. Cataract (in the operative eye) is believed to be significantly contributing to the patient's visual impairment. 3. The patient desires surgical correction; the risks, benefits, and alternatives have been explained, and questions have been answered to the patients satisfaction.  A reasonable expectation exists that cataract surgery will significantly improve both the visual and functional status of the patient.  Jearldean Gutt 09/20/2019 10:51 AM

## 2019-09-20 NOTE — Anesthesia Preprocedure Evaluation (Signed)
Anesthesia Evaluation  Patient identified by MRN, date of birth, ID band Patient awake    Reviewed: Allergy & Precautions, H&P , NPO status , Patient's Chart, lab work & pertinent test results, reviewed documented beta blocker date and time   History of Anesthesia Complications (+) PONV and history of anesthetic complications  Airway Mallampati: II  TM Distance: >3 FB Neck ROM: full    Dental no notable dental hx.    Pulmonary neg pulmonary ROS,    Pulmonary exam normal breath sounds clear to auscultation       Cardiovascular Exercise Tolerance: Good hypertension, Normal cardiovascular exam Rhythm:regular Rate:Normal     Neuro/Psych PSYCHIATRIC DISORDERS Anxiety Depression Bipolar Disorder negative neurological ROS     GI/Hepatic Neg liver ROS, hiatal hernia,   Endo/Other  negative endocrine ROS  Renal/GU negative Renal ROS  negative genitourinary   Musculoskeletal   Abdominal   Peds  Hematology negative hematology ROS (+)   Anesthesia Other Findings   Reproductive/Obstetrics negative OB ROS                             Anesthesia Physical Anesthesia Plan  ASA: II  Anesthesia Plan: MAC   Post-op Pain Management:    Induction:   PONV Risk Score and Plan:   Airway Management Planned:   Additional Equipment:   Intra-op Plan:   Post-operative Plan:   Informed Consent: I have reviewed the patients History and Physical, chart, labs and discussed the procedure including the risks, benefits and alternatives for the proposed anesthesia with the patient or authorized representative who has indicated his/her understanding and acceptance.     Dental Advisory Given  Plan Discussed with: CRNA  Anesthesia Plan Comments:         Anesthesia Quick Evaluation

## 2019-09-21 ENCOUNTER — Encounter: Payer: Self-pay | Admitting: Ophthalmology

## 2019-09-21 ENCOUNTER — Ambulatory Visit: Payer: Medicare Other | Admitting: Licensed Clinical Social Worker

## 2019-09-25 ENCOUNTER — Ambulatory Visit: Payer: Medicare Other

## 2019-09-25 ENCOUNTER — Other Ambulatory Visit: Payer: Self-pay

## 2019-09-25 DIAGNOSIS — R262 Difficulty in walking, not elsewhere classified: Secondary | ICD-10-CM

## 2019-09-25 DIAGNOSIS — M25552 Pain in left hip: Secondary | ICD-10-CM | POA: Diagnosis not present

## 2019-09-25 DIAGNOSIS — M25551 Pain in right hip: Secondary | ICD-10-CM

## 2019-09-25 NOTE — Therapy (Signed)
[facility] [address], [place], [address] Phone: [phone]   Fax:  [phone]  Physical Therapy Treatment  Patient Details  Name: Kendra [name] MRN: [id number] Date of Birth: 02-02-1946 No data recorded  Encounter Date: 09/25/2019  PT End of Session - 09/25/19 1537    Visit Number  3    Number of Visits  17    Date for PT Re-Evaluation  11/07/19    Authorization Type  3 / 10    PT Start Time  [id number]    PT Stop Time  1600    PT Time Calculation (min)  45 min    Activity Tolerance  Patient tolerated treatment well;No increased pain       Past Medical History:  Diagnosis Date  . Anxiety   . Breast cancer ([place]) 03/2018   right breast cancer at 11:00 and 1:00  . Bursitis    bilateral hips and knees  . Complication of anesthesia   . Depression   . Family history of breast cancer 03/24/2018  . Family history of lung cancer 03/24/2018  . History of alcohol dependence ([place]) 2007   no ETOH; resolved since 2007  . History of hiatal hernia   . History of psychosis 2014   due to sleep disturbance  . Hypertension   . Personal history of radiation therapy 06/2018-07/2018   right breast ca  . PONV (postoperative nausea and vomiting)     Past Surgical History:  Procedure Laterality Date  . BREAST BIOPSY Right 03/14/2018   11:00 DCIS and invasive ductal carcinoma  . BREAST BIOPSY Right 03/14/2018   1:00 Invasive ductal carcinoma  . BREAST LUMPECTOMY Right 04/27/2018   lumpectomy of 11 and 1:00 cancers, clear margins, negative LN  . BREAST LUMPECTOMY WITH RADIOACTIVE SEED AND SENTINEL LYMPH NODE BIOPSY Right 04/27/2018   Procedure: RIGHT BREAST LUMPECTOMY WITH RADIOACTIVE SEED X 2 AND RIGHT SENTINEL LYMPH NODE BIOPSY;  Surgeon: [name], MD;  Location: [facility];  Service: General;  Laterality: Right;  . CATARACT EXTRACTION W/PHACO Left 08/30/2019   Procedure: CATARACT EXTRACTION  PHACO AND INTRAOCULAR LENS PLACEMENT ([place]) LEFT panoptix toric  01:20.5  12.7%  10.26;  Surgeon: Leandrew Koyanagi, MD;  Location: [facility];  Service: Ophthalmology;  Laterality: Left;  . CATARACT EXTRACTION W/PHACO Right 09/20/2019   Procedure: CATARACT EXTRACTION PHACO AND INTRAOCULAR LENS PLACEMENT (IOC) RIGHT 5.71  00:36.4  15.7%;  Surgeon: Leandrew Koyanagi, MD;  Location: [facility];  Service: Ophthalmology;  Laterality: Right;  . LAPAROTOMY N/A 02/10/2018   Procedure: EXPLORATORY LAPAROTOMY;  Surgeon: Isabel Caprice, MD;  Location: [facility];  Service: Gynecology;  Laterality: N/A;  . MASS EXCISION  01/2018   abdominal  . OVARIAN CYST REMOVAL    . SALPINGOOPHORECTOMY Bilateral 02/10/2018   Procedure: BILATERAL SALPINGO OOPHORECTOMY; PERITONEAL WASHINGS;  Surgeon: Isabel Caprice, MD;  Location: [facility];  Service: Gynecology;  Laterality: Bilateral;  . TONSILLECTOMY    . TONSILLECTOMY AND ADENOIDECTOMY  1953    There were no vitals filed for this visit.  Subjective Assessment - 09/25/19 1533    Subjective  Patient reports increased pain along her affected hip with walking and transferring. Patient states her pain continues to feel like a toothache.    Pertinent History  Patient report unclear regarding onset. Visited MD on Sept 16 and received injection with relief for approximately 1 month. Reports she cannot receive an additional injection pending eye  surgery. Received dx from MD of hip bursitis in B hips. Reports pain with walking, stanindg, sitting 5/10 current 7/10 worst 3/10 best, with temporary relief when she is sitting "just right" and with naproxen. Agg: activity. Cormorbidities: hx of breast cancer, HTN, MCI, insomnia, cataracts    Limitations  Sitting;Standing;Walking;House hold activities    How long can you sit comfortably?  Pain constant, reduced with neutral pelvic tilt    How long can you stand comfortably?  Pain constant, increases with time    How  long can you walk comfortably?  Pain constant, increases with time    Patient Stated Goals  Be able to move without hurting, walk 1 hour    Currently in Pain?  No/denies       TREATMENT TE Review of HEP: pt demonstrates good recall Adductor squeezes Glute stretch  Bridges with UE support 3 sec hold - 2 x 10 DKTC with physioball - x 20  SKTC stretch R/L x 30 2 sec hold LTRs x 20 L/R LTRs on ball - x 20  Hooklying hip abduction green  theraband x 20 Ambulation - 2 x 5 min to improve hip strengthening and hip extension AROM   TE for reduction of pain and strengthening of B hips.    Added LTR, bridges, and SKTC to HEP *                        PT Education - 09/25/19 1536    Education provided  Yes    Education Details  form/technique with exercise    Person(s) Educated  Patient    Methods  Explanation;Demonstration    Comprehension  Verbalized understanding;Returned demonstration       PT Short Term Goals - 09/12/19 1419      PT SHORT TERM GOAL #1   Title  Patient will demonstrate complaince with HEP to speed recovery and reduce total number of visits.    Baseline  HEP given    Time  2    Period  Weeks    Status  New    Target Date  09/27/19        PT Long Term Goals - 09/14/19 1742      PT LONG TERM GOAL #1   Title  Patient will improve hip strength to 4/5 grossly to facilitate improved mobility with ADLs.    Baseline  3, 4-    Time  8    Period  Weeks    Status  New      PT LONG TERM GOAL #2   Title  Patient will demonstrate walking tolerance increased to 30 minutes without increase in symptoms to demonstrate improved ability to perform walking program for cardiovascular health.    Baseline  10 minute tolerance (patient report)    Time  8    Period  Weeks    Status  New      PT LONG TERM GOAL #3   Title  Patient will report worst pain of 3/10 during therapy to demonstrate reduced disability    Baseline  Worst 7/10    Time  8     Period  Weeks    Status  New      PT LONG TERM GOAL #4   Title  Patient will demonstrate ability to perform all bed mobility maneuvers without increase in pain for reduced disability with mobility and transfers with ADLs.    Baseline  Reports pain with bed mobility  supine <> sidelying L/R, sidelying to sitting EOB    Time  8    Period  Weeks    Status  New      PT LONG TERM GOAL #5   Title  Patient will improve 10MW speed to 1.0 m/sec to meet norms for minimal fall risk and full community ambulation.    Baseline  0.88 m/sec    Time  8    Period  Weeks    Status  New    Target Date  11/09/19            Plan - 09/25/19 1545    Clinical Impression Statement  Patient demonstrates an increase in L LE and hip pain at around minute 4 of walking. Patient demonstrates improvement of symptoms after performing LTRs, bridges, and SKTC exercises. Patient intially decreased motor control with performing hip ER in hooklying which improved with repetitions performed. Patient continues to have increased pain with walking but improved after performing the exercises. Patient will benefit from further skilled therapy to return to prior level of function.    Personal Factors and Comorbidities  Age;Comorbidity 3+;Time since onset of injury/illness/exacerbation    Comorbidities  MCI, HTN, CA, cataracts    Examination-Activity Limitations  Bend;Sit;Squat;Stairs;Stand;Sleep    Examination-Participation Restrictions  Cleaning;Community Activity    Stability/Clinical Decision Making  Evolving/Moderate complexity    Rehab Potential  Fair    Clinical Impairments Affecting Rehab Potential  MCI    PT Frequency  2x / week   Eval and 1 f/u visit   PT Treatment/Interventions  ADLs/Self Care Home Management;Therapeutic exercise;Patient/family education;Neuromuscular re-education;Therapeutic activities;Functional mobility training;Balance training;Manual techniques;Gait training;Stair training;Moist Heat;Electrical  Stimulation;Cryotherapy;Taping;Dry needling;Energy conservation;Passive range of motion;Joint Manipulations    PT Next Visit Plan  Progress hip therex    PT Home Exercise Plan  Glute stretch, adductor squeeze, walking 6-10 min    Consulted and Agree with Plan of Care  Patient       Patient will benefit from skilled therapeutic intervention in order to improve the following deficits and impairments:  Decreased range of motion, Postural dysfunction, Pain, Abnormal gait, Decreased activity tolerance, Decreased coordination, Decreased endurance, Decreased balance, Decreased mobility, Difficulty walking, Hypomobility, Increased muscle spasms, Impaired perceived functional ability, Decreased strength, Impaired flexibility  Visit Diagnosis: Pain in left hip  Pain in right hip  Difficulty in walking, not elsewhere classified     Problem List Patient Active Problem List   Diagnosis Date Noted  . Avitaminosis D 08/24/2019  . Insomnia due to mental condition 05/31/2019  . Bipolar I disorder, most recent episode depressed ([place]) 05/31/2019  . Mild cognitive disorder 05/31/2019  . Alcohol use disorder, moderate, in sustained remission ([place]) 05/31/2019  . Elevated tumor markers 05/27/2019  . GAD (generalized anxiety disorder) 04/10/2019  . Altered mental status 04/10/2019  . MDD (major depressive disorder), severe ([place]) 01/31/2019  . High serum carbohydrate antigen 19-9 (CA19-9) 12/01/2018  . Adjustment disorder with anxious mood 11/24/2018  . Essential hypertension 11/24/2018  . Screening for lipid disorders 11/24/2018  . B12 deficiency 10/17/2018  . Incisional hernia, without obstruction or gangrene 09/12/2018  . Genetic testing 04/07/2018  . Family history of breast cancer 03/24/2018  . Family history of lung cancer 03/24/2018  . Malignant neoplasm of upper-outer quadrant of right breast in female, estrogen receptor positive ([place]) 03/17/2018  . Malignant neoplasm of upper-inner quadrant  of right breast in female, estrogen receptor positive ([place]) 03/17/2018  . History of alcohol dependence ([place]) 02/07/2018  .  History of psychosis 02/07/2018  . History of insomnia 02/07/2018    Blythe Stanford, PT DPT 09/25/2019, 4:05 PM  [facility] [name] [place], [address] Phone: [phone]   Fax:  [phone]  Name: Kendra [name] MRN: [id number] Date of Birth: 1946/01/18

## 2019-09-27 ENCOUNTER — Other Ambulatory Visit: Payer: Self-pay

## 2019-09-27 ENCOUNTER — Ambulatory Visit: Payer: Medicare Other

## 2019-09-27 DIAGNOSIS — M25552 Pain in left hip: Secondary | ICD-10-CM | POA: Diagnosis not present

## 2019-09-27 DIAGNOSIS — M25551 Pain in right hip: Secondary | ICD-10-CM

## 2019-09-27 DIAGNOSIS — R262 Difficulty in walking, not elsewhere classified: Secondary | ICD-10-CM

## 2019-09-27 NOTE — Therapy (Signed)
[facility] [address] [place], [address] Phone: [phone]   Fax:  [phone]  Physical Therapy Treatment  Patient Details  Name: Kendra [name] MRN: [id number] Date of Birth: April 25, 1946 No data recorded  Encounter Date: 09/27/2019  PT End of Session - 09/27/19 1517    Visit Number  4    Number of Visits  17    Date for PT Re-Evaluation  11/07/19    Authorization Type  4 / 10    PT Start Time  [id number]    PT Stop Time  1600    PT Time Calculation (min)  43 min    Activity Tolerance  Patient tolerated treatment well;No increased pain       Past Medical History:  Diagnosis Date  . Anxiety   . Breast cancer ([place]) 03/2018   right breast cancer at 11:00 and 1:00  . Bursitis    bilateral hips and knees  . Complication of anesthesia   . Depression   . Family history of breast cancer 03/24/2018  . Family history of lung cancer 03/24/2018  . History of alcohol dependence ([place]) 2007   no ETOH; resolved since 2007  . History of hiatal hernia   . History of psychosis 2014   due to sleep disturbance  . Hypertension   . Personal history of radiation therapy 06/2018-07/2018   right breast ca  . PONV (postoperative nausea and vomiting)     Past Surgical History:  Procedure Laterality Date  . BREAST BIOPSY Right 03/14/2018   11:00 DCIS and invasive ductal carcinoma  . BREAST BIOPSY Right 03/14/2018   1:00 Invasive ductal carcinoma  . BREAST LUMPECTOMY Right 04/27/2018   lumpectomy of 11 and 1:00 cancers, clear margins, negative LN  . BREAST LUMPECTOMY WITH RADIOACTIVE SEED AND SENTINEL LYMPH NODE BIOPSY Right 04/27/2018   Procedure: RIGHT BREAST LUMPECTOMY WITH RADIOACTIVE SEED X 2 AND RIGHT SENTINEL LYMPH NODE BIOPSY;  Surgeon: [name], MD;  Location: [facility];  Service: General;  Laterality: Right;  . CATARACT EXTRACTION W/PHACO Left 08/30/2019   Procedure: CATARACT EXTRACTION  PHACO AND INTRAOCULAR LENS PLACEMENT ([place]) LEFT panoptix toric  01:20.5  12.7%  10.26;  Surgeon: Leandrew Koyanagi, MD;  Location: [facility];  Service: Ophthalmology;  Laterality: Left;  . CATARACT EXTRACTION W/PHACO Right 09/20/2019   Procedure: CATARACT EXTRACTION PHACO AND INTRAOCULAR LENS PLACEMENT ([facility]) RIGHT 5.71  00:36.4  15.7%;  Surgeon: Leandrew Koyanagi, MD;  Location: [facility];  Service: Ophthalmology;  Laterality: Right;  . LAPAROTOMY N/A 02/10/2018   Procedure: EXPLORATORY LAPAROTOMY;  Surgeon: Isabel Caprice, MD;  Location: [facility];  Service: Gynecology;  Laterality: N/A;  . MASS EXCISION  01/2018   abdominal  . OVARIAN CYST REMOVAL    . SALPINGOOPHORECTOMY Bilateral 02/10/2018   Procedure: BILATERAL SALPINGO OOPHORECTOMY; PERITONEAL WASHINGS;  Surgeon: Isabel Caprice, MD;  Location: [facility];  Service: Gynecology;  Laterality: Bilateral;  . TONSILLECTOMY    . TONSILLECTOMY AND ADENOIDECTOMY  1953    There were no vitals filed for this visit.  Subjective Assessment - 09/27/19 1519    Subjective  Patient reports taking 15 min walk with three rest breaks and doing two 30 min yoga sessions today which flared her up. Reports that hips have been painful. Reports performing HEP but that increased her pain with SKTC being the worst.    Pertinent History  Patient report unclear regarding onset. Visited MD on Sept  16 and received injection with relief for approximately 1 month. Reports she cannot receive an additional injection pending eye surgery. Received dx from MD of hip bursitis in B hips. Reports pain with walking, stanindg, sitting 5/10 current 7/10 worst 3/10 best, with temporary relief when she is sitting "just right" and with naproxen. Agg: activity. Cormorbidities: hx of breast cancer, HTN, MCI, insomnia, cataracts    Limitations  Sitting;Standing;Walking;House hold activities    How long can you sit comfortably?  Pain constant, reduced with neutral  pelvic tilt    How long can you stand comfortably?  Pain constant, increases with time    How long can you walk comfortably?  Pain constant, increases with time    Patient Stated Goals  Be able to move without hurting, walk 1 hour    Currently in Pain?  Yes    Pain Score  5     Pain Location  Hip    Pain Orientation  Right;Left    Pain Descriptors / Indicators  Aching    Pain Type  Chronic pain    Pain Onset  More than a month ago    Pain Frequency  Constant      TREATMENT  TA Instruction in: Bed mobility supine <> sitting EOB Sit <> stand with and without UE assist  Instruction in sequencing for reduced pain and improved energy conservation with transfers.  Patient demonstrates good recall independently performing transfers with novel technique throughout session.   TE Bridges 2 x 10 LTRs x 20 R/L Hooklying hip abd red band x 20 cues for slow eccentric Hooklying ULE hip abd R/L red theraband x 10 Lateral stepping 4 x 10 ft lengths Lateral stepping with ball toss 6 x 10 ft lengths note mild compensation into truncal flexion and hip external rotation SLS 10 sec holds x 3 R/L SLS marches 3 sec hold x 20 R/L Leg press 45# 2 x 10  TE for LE strengthening and stability with functional movements  PT Education - 09/27/19 1758    Education provided  Yes    Education Details  form/technique with exercise; DOMS    Person(s) Educated  Patient    Methods  Explanation;Demonstration;Verbal cues;Tactile cues    Comprehension  Returned demonstration;Verbalized understanding;Verbal cues required;Tactile cues required       PT Short Term Goals - 09/12/19 1419      PT SHORT TERM GOAL #1   Title  Patient will demonstrate complaince with HEP to speed recovery and reduce total number of visits.    Baseline  HEP given    Time  2    Period  Weeks    Status  New    Target Date  09/27/19        PT Long Term Goals - 09/14/19 1742      PT LONG TERM GOAL #1   Title  Patient will  improve hip strength to 4/5 grossly to facilitate improved mobility with ADLs.    Baseline  3, 4-    Time  8    Period  Weeks    Status  New      PT LONG TERM GOAL #2   Title  Patient will demonstrate walking tolerance increased to 30 minutes without increase in symptoms to demonstrate improved ability to perform walking program for cardiovascular health.    Baseline  10 minute tolerance (patient report)    Time  8    Period  Weeks    Status  New  PT LONG TERM GOAL #3   Title  Patient will report worst pain of 3/10 during therapy to demonstrate reduced disability    Baseline  Worst 7/10    Time  8    Period  Weeks    Status  New      PT LONG TERM GOAL #4   Title  Patient will demonstrate ability to perform all bed mobility maneuvers without increase in pain for reduced disability with mobility and transfers with ADLs.    Baseline  Reports pain with bed mobility supine <> sidelying L/R, sidelying to sitting EOB    Time  8    Period  Weeks    Status  New      PT LONG TERM GOAL #5   Title  Patient will improve 10MW speed to 1.0 m/sec to meet norms for minimal fall risk and full community ambulation.    Baseline  0.88 m/sec    Time  8    Period  Weeks    Status  New    Target Date  11/09/19            Plan - 09/27/19 1759    Clinical Impression Statement  Patient tolerates exercises well reporting sensations consistent with DOMS particularly in the glutes. Reports pain with SKTC but is able to mount leg press machine partway through session without issue demonstrating similar degree of hip flexion. Demonstrates good recall with instruction in transfers. Pain presentation appears to be inconsistent but resolving in BLE hips although weakness remains apparent. Note no c/o peripatellar N/T this session. Patient will benefit from additional skliled physical therapy for reduction of pain and return to PLOF.    Personal Factors and Comorbidities  Age;Comorbidity 3+;Time since  onset of injury/illness/exacerbation    Comorbidities  MCI, HTN, CA, cataracts    Examination-Activity Limitations  Bend;Sit;Squat;Stairs;Stand;Sleep    Examination-Participation Restrictions  Cleaning;Community Activity    Stability/Clinical Decision Making  Evolving/Moderate complexity    Rehab Potential  Fair    Clinical Impairments Affecting Rehab Potential  MCI    PT Frequency  2x / week   Eval and 1 f/u visit   PT Treatment/Interventions  ADLs/Self Care Home Management;Therapeutic exercise;Patient/family education;Neuromuscular re-education;Therapeutic activities;Functional mobility training;Balance training;Manual techniques;Gait training;Stair training;Moist Heat;Electrical Stimulation;Cryotherapy;Taping;Dry needling;Energy conservation;Passive range of motion;Joint Manipulations    PT Next Visit Plan  Progress hip therex    PT Home Exercise Plan  Glute stretch, adductor squeeze, walking 6-10 min    Consulted and Agree with Plan of Care  Patient       Patient will benefit from skilled therapeutic intervention in order to improve the following deficits and impairments:  Decreased range of motion, Postural dysfunction, Pain, Abnormal gait, Decreased activity tolerance, Decreased coordination, Decreased endurance, Decreased balance, Decreased mobility, Difficulty walking, Hypomobility, Increased muscle spasms, Impaired perceived functional ability, Decreased strength, Impaired flexibility  Visit Diagnosis: Pain in left hip  Pain in right hip  Difficulty in walking, not elsewhere classified     Problem List Patient Active Problem List   Diagnosis Date Noted  . Avitaminosis D 08/24/2019  . Insomnia due to mental condition 05/31/2019  . Bipolar I disorder, most recent episode depressed ([place]) 05/31/2019  . Mild cognitive disorder 05/31/2019  . Alcohol use disorder, moderate, in sustained remission ([place]) 05/31/2019  . Elevated tumor markers 05/27/2019  . GAD (generalized anxiety  disorder) 04/10/2019  . Altered mental status 04/10/2019  . MDD (major depressive disorder), severe ([place]) 01/31/2019  . High serum carbohydrate  antigen 19-9 (CA19-9) 12/01/2018  . Adjustment disorder with anxious mood 11/24/2018  . Essential hypertension 11/24/2018  . Screening for lipid disorders 11/24/2018  . B12 deficiency 10/17/2018  . Incisional hernia, without obstruction or gangrene 09/12/2018  . Genetic testing 04/07/2018  . Family history of breast cancer 03/24/2018  . Family history of lung cancer 03/24/2018  . Malignant neoplasm of upper-outer quadrant of right breast in female, estrogen receptor positive ([place]) 03/17/2018  . Malignant neoplasm of upper-inner quadrant of right breast in female, estrogen receptor positive ([place]) 03/17/2018  . History of alcohol dependence ([place]) 02/07/2018  . History of psychosis 02/07/2018  . History of insomnia 02/07/2018    Virgia Land, SPT 09/27/2019, 6:11 PM  [facility] [address], [place], [address] Phone: [phone]   Fax:  [phone]  Name: Kendra [name] MRN: [id number] Date of Birth: 17-Mar-1946

## 2019-10-02 ENCOUNTER — Telehealth: Payer: Self-pay

## 2019-10-02 ENCOUNTER — Ambulatory Visit: Payer: Medicare Other | Admitting: Family Medicine

## 2019-10-02 NOTE — Progress Notes (Deleted)
       Patient: Kendra [name] Female    DOB: [birth year]   73 y.o.   MRN: [id number] Visit Date: 10/02/2019  Today's Provider: Lavon Paganini, MD   No chief complaint on file.  Subjective:     HPI  Allergies  Allergen Reactions  . Hydroxyzine Anaphylaxis    Tongue swollen  . Shrimp [Shellfish Allergy] Other (See Comments)    Food poisoning      Current Outpatient Medications:  .  cyanocobalamin (,VITAMIN B-12,) 1000 MCG/ML injection, Inject 1 mL (1,000 mcg total) into the muscle every 30 (thirty) days., Disp: 3 mL, Rfl: 3 .  docusate sodium (COLACE) 100 MG capsule, Take 1 capsule (100 mg total) by mouth daily., Disp: 30 capsule, Rfl: 3 .  ERGOCALCIFEROL PO, Take 2,400 mg by mouth daily., Disp: , Rfl:  .  FLUoxetine HCl 60 MG TABS, Take 60 mg by mouth daily., Disp: 90 tablet, Rfl: 1 .  letrozole (FEMARA) 2.5 MG tablet, Take 1 tablet (2.5 mg total) by mouth daily., Disp: 90 tablet, Rfl: 3 .  lisinopril-hydrochlorothiazide (ZESTORETIC) 20-12.5 MG tablet, Take 1 tablet by mouth daily., Disp: 90 tablet, Rfl: 3 .  Melatonin 10 MG TABS, Take 30 mg by mouth at bedtime., Disp: , Rfl:  .  naproxen sodium (ALEVE) 220 MG tablet, Take 1 tablet (220 mg total) by mouth 2 (two) times daily as needed., Disp: 60 tablet, Rfl: 1 .  risperiDONE (RISPERDAL) 3 MG tablet, Take 0.5 tablets (1.5 mg total) by mouth 2 (two) times daily., Disp: 90 tablet, Rfl: 1  Review of Systems  Social History   Tobacco Use  . Smoking status: Never Smoker  . Smokeless tobacco: Never Used  Substance Use Topics  . Alcohol use: Not Currently    Frequency: Never    Comment: alcohol dependence prior to 2007      Objective:   There were no vitals taken for this visit. There were no vitals filed for this visit.There is no height or weight on file to calculate BMI.   Physical Exam   No results found for any visits on 10/02/19.     Assessment & Plan        Lavon Paganini, MD  [facility]

## 2019-10-02 NOTE — Telephone Encounter (Signed)
Copied from [place] [phone] [id number]. Topic: Appointment Scheduling - Scheduling Inquiry for Clinic >> Oct 02, 2019  2:16 PM [name], [name] wrote: Patient states she is running a little behind today, due to locking keys in car. Patient inquired if she can still keep her appointment if she is a little later.

## 2019-10-03 ENCOUNTER — Other Ambulatory Visit: Payer: Self-pay

## 2019-10-03 ENCOUNTER — Ambulatory Visit: Payer: Medicare Other | Attending: Family Medicine

## 2019-10-03 DIAGNOSIS — M25551 Pain in right hip: Secondary | ICD-10-CM

## 2019-10-03 DIAGNOSIS — M25552 Pain in left hip: Secondary | ICD-10-CM | POA: Diagnosis not present

## 2019-10-03 DIAGNOSIS — R262 Difficulty in walking, not elsewhere classified: Secondary | ICD-10-CM | POA: Insufficient documentation

## 2019-10-03 NOTE — Therapy (Signed)
[facility] [address], [place], [address] Phone: [phone]   Fax:  [phone]  Physical Therapy Treatment  Patient Details  Name: Kendra [name] MRN: [id number] Date of Birth: 09/10/1946 No data recorded  Encounter Date: 10/03/2019  PT End of Session - 10/03/19 1432    Visit Number  5    Number of Visits  17    Date for PT Re-Evaluation  11/07/19    Authorization Type  5 / 10    PT Start Time  [id number]    PT Stop Time  1515    PT Time Calculation (min)  41 min    Activity Tolerance  Patient tolerated treatment well;No increased pain       Past Medical History:  Diagnosis Date  . Anxiety   . Breast cancer ([place]) 03/2018   right breast cancer at 11:00 and 1:00  . Bursitis    bilateral hips and knees  . Complication of anesthesia   . Depression   . Family history of breast cancer 03/24/2018  . Family history of lung cancer 03/24/2018  . History of alcohol dependence ([place]) 2007   no ETOH; resolved since 2007  . History of hiatal hernia   . History of psychosis 2014   due to sleep disturbance  . Hypertension   . Personal history of radiation therapy 06/2018-07/2018   right breast ca  . PONV (postoperative nausea and vomiting)     Past Surgical History:  Procedure Laterality Date  . BREAST BIOPSY Right 03/14/2018   11:00 DCIS and invasive ductal carcinoma  . BREAST BIOPSY Right 03/14/2018   1:00 Invasive ductal carcinoma  . BREAST LUMPECTOMY Right 04/27/2018   lumpectomy of 11 and 1:00 cancers, clear margins, negative LN  . BREAST LUMPECTOMY WITH RADIOACTIVE SEED AND SENTINEL LYMPH NODE BIOPSY Right 04/27/2018   Procedure: RIGHT BREAST LUMPECTOMY WITH RADIOACTIVE SEED X 2 AND RIGHT SENTINEL LYMPH NODE BIOPSY;  Surgeon: [name], MD;  Location: [facility];  Service: General;  Laterality: Right;  . CATARACT EXTRACTION W/PHACO Left 08/30/2019   Procedure: CATARACT EXTRACTION  PHACO AND INTRAOCULAR LENS PLACEMENT ([facility]) LEFT panoptix toric  01:20.5  12.7%  10.26;  Surgeon: Leandrew Koyanagi, MD;  Location: [facility];  Service: Ophthalmology;  Laterality: Left;  . CATARACT EXTRACTION W/PHACO Right 09/20/2019   Procedure: CATARACT EXTRACTION PHACO AND INTRAOCULAR LENS PLACEMENT ([facility]) RIGHT 5.71  00:36.4  15.7%;  Surgeon: Leandrew Koyanagi, MD;  Location: [facility];  Service: Ophthalmology;  Laterality: Right;  . LAPAROTOMY N/A 02/10/2018   Procedure: EXPLORATORY LAPAROTOMY;  Surgeon: Isabel Caprice, MD;  [facility];  Service: Gynecology;  Laterality: N/A;  . MASS EXCISION  01/2018   abdominal  . OVARIAN CYST REMOVAL    . SALPINGOOPHORECTOMY Bilateral 02/10/2018   Procedure: BILATERAL SALPINGO OOPHORECTOMY; PERITONEAL WASHINGS;  Surgeon: Isabel Caprice, MD;  [facility];  Service: Gynecology;  Laterality: Bilateral;  . TONSILLECTOMY    . TONSILLECTOMY AND ADENOIDECTOMY  1953    There were no vitals filed for this visit.  Subjective Assessment - 10/03/19 1435    Subjective  Patient reports continuing pain in hips and reports doing HEP before getting out of bed.    Pertinent History  Patient report unclear regarding onset. Visited MD on Sept 16 and received injection with relief for approximately 1 month. Reports she cannot receive an additional injection pending eye surgery. Received dx from MD of hip  bursitis in B hips. Reports pain with walking, stanindg, sitting 5/10 current 7/10 worst 3/10 best, with temporary relief when she is sitting "just right" and with naproxen. Agg: activity. Cormorbidities: hx of breast cancer, HTN, MCI, insomnia, cataracts    Limitations  Sitting;Standing;Walking;House hold activities    How long can you sit comfortably?  Pain constant, reduced with neutral pelvic tilt    How long can you stand comfortably?  Pain constant, increases with time    How long can you walk comfortably?  Pain constant,  increases with time    Patient Stated Goals  Be able to move without hurting, walk 1 hour    Currently in Pain?  Yes    Pain Score  5     Pain Location  Hip    Pain Orientation  Left;Right    Pain Descriptors / Indicators  Aching    Pain Onset  More than a month ago    Multiple Pain Sites  No       TREATMENT  TA Review of transfer training. Pt demonstrates good retention of bed mobility and sit <> stand requiring minor cueing for initiation and avoidance of compensation.  MT Myofascial release and trigger point release to quads hs TFL glute med L/R for reduction of pain and improved tissue extensibility HS stretch manually 2 x 30 sec R/L Direct distraction to hip R/L x 30 oscillations MWM into hip flexion per Mulligan 20 repetitions R/L  TE DKTC over red ball x 20 with cues for increased excursion to restore mobility and reduce pain to BLE LTRs over ball R/L x 20 to reduce pain and restore mobility in lumbar spine and hip girdle PROM > AAROM > AROM hip flexion marches x 40 Hip swings flexion extension abd x 10 R/L for lateral hip stability      PT Education - 10/03/19 1432    Education provided  Yes    Education Details  form/technique with exercise    Person(s) Educated  Patient    Methods  Explanation;Demonstration;Tactile cues;Verbal cues    Comprehension  Verbalized understanding;Returned demonstration;Verbal cues required;Tactile cues required       PT Short Term Goals - 09/12/19 1419      PT SHORT TERM GOAL #1   Title  Patient will demonstrate complaince with HEP to speed recovery and reduce total number of visits.    Baseline  HEP given    Time  2    Period  Weeks    Status  New    Target Date  09/27/19        PT Long Term Goals - 09/14/19 1742      PT LONG TERM GOAL #1   Title  Patient will improve hip strength to 4/5 grossly to facilitate improved mobility with ADLs.    Baseline  3, 4-    Time  8    Period  Weeks    Status  New      PT LONG TERM  GOAL #2   Title  Patient will demonstrate walking tolerance increased to 30 minutes without increase in symptoms to demonstrate improved ability to perform walking program for cardiovascular health.    Baseline  10 minute tolerance (patient report)    Time  8    Period  Weeks    Status  New      PT LONG TERM GOAL #3   Title  Patient will report worst pain of 3/10 during therapy to demonstrate reduced disability  Baseline  Worst 7/10    Time  8    Period  Weeks    Status  New      PT LONG TERM GOAL #4   Title  Patient will demonstrate ability to perform all bed mobility maneuvers without increase in pain for reduced disability with mobility and transfers with ADLs.    Baseline  Reports pain with bed mobility supine <> sidelying L/R, sidelying to sitting EOB    Time  8    Period  Weeks    Status  New      PT LONG TERM GOAL #5   Title  Patient will improve 10MW speed to 1.0 m/sec to meet norms for minimal fall risk and full community ambulation.    Baseline  0.88 m/sec    Time  8    Period  Weeks    Status  New    Target Date  11/09/19            Plan - 10/03/19 1643    Clinical Impression Statement  Patient reports to PT with elevated pain response but note improved movement quality with ambulation. On palpation focal tightness is evident over major muscle bellies consistent with DOMS and responding well to manual therapy. Pain reduced with gentle exercise. Patient demonstrates good recall with instruction in transfers indicating carryover from last session. Pain presentation primarily in glutes. C/O peripatellar N/T after walking ~150 ft this session. Patient will benefit from additional skiled physical therapy for reduction of pain and return to PLOF.    Personal Factors and Comorbidities  Age;Comorbidity 3+;Time since onset of injury/illness/exacerbation    Comorbidities  MCI, HTN, CA, cataracts    Examination-Activity Limitations  Bend;Sit;Squat;Stairs;Stand;Sleep     Examination-Participation Restrictions  Cleaning;Community Activity    Stability/Clinical Decision Making  Evolving/Moderate complexity    Rehab Potential  Fair    Clinical Impairments Affecting Rehab Potential  MCI    PT Frequency  2x / week   Eval and 1 f/u visit   PT Treatment/Interventions  ADLs/Self Care Home Management;Therapeutic exercise;Patient/family education;Neuromuscular re-education;Therapeutic activities;Functional mobility training;Balance training;Manual techniques;Gait training;Stair training;Moist Heat;Electrical Stimulation;Cryotherapy;Taping;Dry needling;Energy conservation;Passive range of motion;Joint Manipulations    PT Next Visit Plan  Progress hip therex    PT Home Exercise Plan  Glute stretch, adductor squeeze, walking 6-10 min    Consulted and Agree with Plan of Care  Patient       Patient will benefit from skilled therapeutic intervention in order to improve the following deficits and impairments:  Decreased range of motion, Postural dysfunction, Pain, Abnormal gait, Decreased activity tolerance, Decreased coordination, Decreased endurance, Decreased balance, Decreased mobility, Difficulty walking, Hypomobility, Increased muscle spasms, Impaired perceived functional ability, Decreased strength, Impaired flexibility  Visit Diagnosis: Pain in left hip  Pain in right hip  Difficulty in walking, not elsewhere classified     Problem List Patient Active Problem List   Diagnosis Date Noted  . Avitaminosis D 08/24/2019  . Insomnia due to mental condition 05/31/2019  . Bipolar I disorder, most recent episode depressed ([place]) 05/31/2019  . Mild cognitive disorder 05/31/2019  . Alcohol use disorder, moderate, in sustained remission ([place]) 05/31/2019  . Elevated tumor markers 05/27/2019  . GAD (generalized anxiety disorder) 04/10/2019  . Altered mental status 04/10/2019  . MDD (major depressive disorder), severe ([place]) 01/31/2019  . High serum carbohydrate antigen  19-9 (CA19-9) 12/01/2018  . Adjustment disorder with anxious mood 11/24/2018  . Essential hypertension 11/24/2018  . Screening for lipid disorders 11/24/2018  .  B12 deficiency 10/17/2018  . Incisional hernia, without obstruction or gangrene 09/12/2018  . Genetic testing 04/07/2018  . Family history of breast cancer 03/24/2018  . Family history of lung cancer 03/24/2018  . Malignant neoplasm of upper-outer quadrant of right breast in female, estrogen receptor positive ([place]) 03/17/2018  . Malignant neoplasm of upper-inner quadrant of right breast in female, estrogen receptor positive ([place]) 03/17/2018  . History of alcohol dependence ([place]) 02/07/2018  . History of psychosis 02/07/2018  . History of insomnia 02/07/2018    Virgia Land, SPT 10/03/2019, 4:49 PM  [facility] [address], [place], [address] Phone: [phone]   Fax:  [phone]  Name: Kendra [name] MRN: [id number] Date of Birth: 03-28-1946

## 2019-10-05 ENCOUNTER — Other Ambulatory Visit: Payer: Self-pay

## 2019-10-05 ENCOUNTER — Ambulatory Visit: Payer: Medicare Other

## 2019-10-05 DIAGNOSIS — M25552 Pain in left hip: Secondary | ICD-10-CM

## 2019-10-05 DIAGNOSIS — R262 Difficulty in walking, not elsewhere classified: Secondary | ICD-10-CM

## 2019-10-05 DIAGNOSIS — M25551 Pain in right hip: Secondary | ICD-10-CM

## 2019-10-05 NOTE — Therapy (Signed)
[facility] [address], [place], [address] Phone: [phone]   Fax:  [phone]  Physical Therapy Treatment  Patient Details  Name: Kendra [name] MRN: [id number] Date of Birth: 1946/04/20 No data recorded  Encounter Date: 10/05/2019  PT End of Session - 10/05/19 1318    Visit Number  6    Number of Visits  17    Date for PT Re-Evaluation  11/07/19    Authorization Type  6 / 10    PT Start Time  [id number]    PT Stop Time  1345    PT Time Calculation (min)  28 min    Activity Tolerance  Patient tolerated treatment well;No increased pain    Behavior During Therapy  WFL for tasks assessed/performed       Past Medical History:  Diagnosis Date  . Anxiety   . Breast cancer ([place]) 03/2018   right breast cancer at 11:00 and 1:00  . Bursitis    bilateral hips and knees  . Complication of anesthesia   . Depression   . Family history of breast cancer 03/24/2018  . Family history of lung cancer 03/24/2018  . History of alcohol dependence ([place]) 2007   no ETOH; resolved since 2007  . History of hiatal hernia   . History of psychosis 2014   due to sleep disturbance  . Hypertension   . Personal history of radiation therapy 06/2018-07/2018   right breast ca  . PONV (postoperative nausea and vomiting)     Past Surgical History:  Procedure Laterality Date  . BREAST BIOPSY Right 03/14/2018   11:00 DCIS and invasive ductal carcinoma  . BREAST BIOPSY Right 03/14/2018   1:00 Invasive ductal carcinoma  . BREAST LUMPECTOMY Right 04/27/2018   lumpectomy of 11 and 1:00 cancers, clear margins, negative LN  . BREAST LUMPECTOMY WITH RADIOACTIVE SEED AND SENTINEL LYMPH NODE BIOPSY Right 04/27/2018   Procedure: RIGHT BREAST LUMPECTOMY WITH RADIOACTIVE SEED X 2 AND RIGHT SENTINEL LYMPH NODE BIOPSY;  Surgeon: [name], MD;  Location: [facility];  Service: General;  Laterality: Right;  . CATARACT  EXTRACTION W/PHACO Left 08/30/2019   Procedure: CATARACT EXTRACTION PHACO AND INTRAOCULAR LENS PLACEMENT ([place]) LEFT panoptix toric  01:20.5  12.7%  10.26;  Surgeon: Leandrew Koyanagi, MD;  Location: [facility];  Service: Ophthalmology;  Laterality: Left;  . CATARACT EXTRACTION W/PHACO Right 09/20/2019   Procedure: CATARACT EXTRACTION PHACO AND INTRAOCULAR LENS PLACEMENT ([facility]) RIGHT 5.71  00:36.4  15.7%;  Surgeon: Leandrew Koyanagi, MD;  Location: [facility];  Service: Ophthalmology;  Laterality: Right;  . LAPAROTOMY N/A 02/10/2018   Procedure: EXPLORATORY LAPAROTOMY;  Surgeon: Isabel Caprice, MD;  Location: [facility];  Service: Gynecology;  Laterality: N/A;  . MASS EXCISION  01/2018   abdominal  . OVARIAN CYST REMOVAL    . SALPINGOOPHORECTOMY Bilateral 02/10/2018   Procedure: BILATERAL SALPINGO OOPHORECTOMY; PERITONEAL WASHINGS;  Surgeon: Isabel Caprice, MD;  Location: [facility];  Service: Gynecology;  Laterality: Bilateral;  . TONSILLECTOMY    . TONSILLECTOMY AND ADENOIDECTOMY  1953    There were no vitals filed for this visit.  Subjective Assessment - 10/05/19 1319    Subjective  Patient reports busy day but feeling pretty good. Walked today for 15 min without pain, then 10 min with some pain.    Pertinent History  Patient report unclear regarding onset. Visited MD on Sept 16 and received injection with relief for approximately 1  month. Reports she cannot receive an additional injection pending eye surgery. Received dx from MD of hip bursitis in B hips. Reports pain with walking, stanindg, sitting 5/10 current 7/10 worst 3/10 best, with temporary relief when she is sitting "just right" and with naproxen. Agg: activity. Cormorbidities: hx of breast cancer, HTN, MCI, insomnia, cataracts    Limitations  Sitting;Standing;Walking;House hold activities    How long can you sit comfortably?  Pain constant, reduced with neutral pelvic tilt    How long can you stand comfortably?   Pain constant, increases with time    How long can you walk comfortably?  Pain constant, increases with time    Patient Stated Goals  Be able to move without hurting, walk 1 hour    Currently in Pain?  No/denies    Pain Onset  More than a month ago        TREATMENT TE Bridges 2 x 10 - cues for alignment and excursion SLR x 10 R/L - slight knee flexion/extensor lag, likely due to tightness in HS SLR HS stretch B 2 x 20 sec - added to HEP Step ups 6" step x 15 leading R/L note reduced valgus but poor eccentric control improved with cues Lateral step ups 4" step x 20 R/L with cues for increased step width SLS 3 sec holds - L stability > R   TE for reduction of pain and strengthening of B hips.       PT Education - 10/05/19 1827    Education provided  Yes    Education Details  form/technique with exercise    Person(s) Educated  Patient    Methods  Explanation;Demonstration;Tactile cues;Verbal cues    Comprehension  Verbalized understanding;Returned demonstration;Verbal cues required;Tactile cues required       PT Short Term Goals - 09/12/19 1419      PT SHORT TERM GOAL #1   Title  Patient will demonstrate complaince with HEP to speed recovery and reduce total number of visits.    Baseline  HEP given    Time  2    Period  Weeks    Status  New    Target Date  09/27/19        PT Long Term Goals - 09/14/19 1742      PT LONG TERM GOAL #1   Title  Patient will improve hip strength to 4/5 grossly to facilitate improved mobility with ADLs.    Baseline  3, 4-    Time  8    Period  Weeks    Status  New      PT LONG TERM GOAL #2   Title  Patient will demonstrate walking tolerance increased to 30 minutes without increase in symptoms to demonstrate improved ability to perform walking program for cardiovascular health.    Baseline  10 minute tolerance (patient report)    Time  8    Period  Weeks    Status  New      PT LONG TERM GOAL #3   Title  Patient will report worst  pain of 3/10 during therapy to demonstrate reduced disability    Baseline  Worst 7/10    Time  8    Period  Weeks    Status  New      PT LONG TERM GOAL #4   Title  Patient will demonstrate ability to perform all bed mobility maneuvers without increase in pain for reduced disability with mobility and transfers with ADLs.  Baseline  Reports pain with bed mobility supine <> sidelying L/R, sidelying to sitting EOB    Time  8    Period  Weeks    Status  New      PT LONG TERM GOAL #5   Title  Patient will improve 10MW speed to 1.0 m/sec to meet norms for minimal fall risk and full community ambulation.    Baseline  0.88 m/sec    Time  8    Period  Weeks    Status  New    Target Date  11/09/19            Plan - 10/05/19 1827    Clinical Impression Statement  Patient reports to PT late due to mistaking appointment time. Otherwise demonstrates improved gait kinematics with noticable increase in gait speed. Patient able to tolerate more challenging exercises with no increase in pain; reported increase in walking tolerance also demonstrates good progress towards goals. Note continued c/o peripatellar N/T which will be investigated in depth next session. Patient will benefit from skilled physical therapy to increase activity tolerance and decrease pain.    Personal Factors and Comorbidities  Age;Comorbidity 3+;Time since onset of injury/illness/exacerbation    Comorbidities  MCI, HTN, CA, cataracts    Examination-Activity Limitations  Bend;Sit;Squat;Stairs;Stand;Sleep    Examination-Participation Restrictions  Cleaning;Community Activity    Stability/Clinical Decision Making  Evolving/Moderate complexity    Rehab Potential  Fair    Clinical Impairments Affecting Rehab Potential  MCI    PT Frequency  2x / week   Eval and 1 f/u visit   PT Treatment/Interventions  ADLs/Self Care Home Management;Therapeutic exercise;Patient/family education;Neuromuscular re-education;Therapeutic  activities;Functional mobility training;Balance training;Manual techniques;Gait training;Stair training;Moist Heat;Electrical Stimulation;Cryotherapy;Taping;Dry needling;Energy conservation;Passive range of motion;Joint Manipulations    PT Next Visit Plan  Progress hip therex; examine patellar N/T onset with walking    PT Home Exercise Plan  SKTC, bridges, hamstring stretch, SLR, walking 15 min    Consulted and Agree with Plan of Care  Patient       Patient will benefit from skilled therapeutic intervention in order to improve the following deficits and impairments:  Decreased range of motion, Postural dysfunction, Pain, Abnormal gait, Decreased activity tolerance, Decreased coordination, Decreased endurance, Decreased balance, Decreased mobility, Difficulty walking, Hypomobility, Increased muscle spasms, Impaired perceived functional ability, Decreased strength, Impaired flexibility  Visit Diagnosis: Pain in left hip  Pain in right hip  Difficulty in walking, not elsewhere classified     Problem List Patient Active Problem List   Diagnosis Date Noted  . Avitaminosis D 08/24/2019  . Insomnia due to mental condition 05/31/2019  . Bipolar I disorder, most recent episode depressed ([place]) 05/31/2019  . Mild cognitive disorder 05/31/2019  . Alcohol use disorder, moderate, in sustained remission ([place]) 05/31/2019  . Elevated tumor markers 05/27/2019  . GAD (generalized anxiety disorder) 04/10/2019  . Altered mental status 04/10/2019  . MDD (major depressive disorder), severe ([place]) 01/31/2019  . High serum carbohydrate antigen 19-9 (CA19-9) 12/01/2018  . Adjustment disorder with anxious mood 11/24/2018  . Essential hypertension 11/24/2018  . Screening for lipid disorders 11/24/2018  . B12 deficiency 10/17/2018  . Incisional hernia, without obstruction or gangrene 09/12/2018  . Genetic testing 04/07/2018  . Family history of breast cancer 03/24/2018  . Family history of lung cancer  03/24/2018  . Malignant neoplasm of upper-outer quadrant of right breast in female, estrogen receptor positive ([facility]) 03/17/2018  . Malignant neoplasm of upper-inner quadrant of right breast in female, estrogen receptor  positive ([place]) 03/17/2018  . History of alcohol dependence ([place]) 02/07/2018  . History of psychosis 02/07/2018  . History of insomnia 02/07/2018    Virgia Land, SPT 10/05/2019, 6:32 PM  [facility] [address] [place], [address] Phone: [phone]   Fax:  [phone]  Name: Kendra [name] MRN: [id number] Date of Birth: 06/05/1946

## 2019-10-06 ENCOUNTER — Ambulatory Visit: Payer: Medicare Other | Admitting: Family Medicine

## 2019-10-06 ENCOUNTER — Encounter: Payer: Self-pay | Admitting: Licensed Clinical Social Worker

## 2019-10-06 ENCOUNTER — Ambulatory Visit (INDEPENDENT_AMBULATORY_CARE_PROVIDER_SITE_OTHER): Payer: Medicare Other | Admitting: Licensed Clinical Social Worker

## 2019-10-06 DIAGNOSIS — F411 Generalized anxiety disorder: Secondary | ICD-10-CM

## 2019-10-06 DIAGNOSIS — F3130 Bipolar disorder, current episode depressed, mild or moderate severity, unspecified: Secondary | ICD-10-CM | POA: Diagnosis not present

## 2019-10-06 NOTE — Progress Notes (Signed)
Virtual Visit via Telephone Note  I connected with Kendra [name] on 10/06/19 at 11:00 AM EST by telephone and verified that I am speaking with the correct person using two identifiers.   I discussed the limitations, risks, security and privacy concerns of performing an evaluation and management service by telephone and the availability of in person appointments. I also discussed with the patient that there may be a patient responsible charge related to this service. The patient expressed understanding and agreed to proceed.  I discussed the assessment and treatment plan with the patient. The patient was provided an opportunity to ask questions and all were answered. The patient agreed with the plan and demonstrated an understanding of the instructions.   The patient was advised to call back or seek an in-person evaluation if the symptoms worsen or if the condition fails to improve as anticipated.  I provided 30 minutes of non-face-to-face time during this encounter.   Alden Hipp, LCSW    THERAPIST PROGRESS NOTE  Session Time: 1100  Participation Level: Active  Behavioral Response: CasualAlertAnxious  Type of Therapy: Individual Therapy  Treatment Goals addressed: Coping  Interventions: CBT  Summary: Kendra [name] is a 73 y.o. female who presents with continued symptoms related to her diagnosis. Kendra [name] reports doing well since our last session. She reports her sleep has improved since the MD changed her medications for sleep, which Kendra [name] reports she has been very thankful of. LCSW validated this idea and reiterated how important restful sleep is when thinking about our mental health. Kendra [name] went on to report she has been able to manage her anxiety well as it comes up, and has continued to utilize her skills we've built thus far in therapy. LCSW celebrated Kendra [name] use of coping skills and recognized how difficult it can be to remember to utilize those skills at times. Kendra [name] went on to  report her surgery went well, and ultimately was not as stressful as she anticipated. LCSW validated this idea and encouraged Kendra [name] to utilize that event as evidence that things will go more smoothly than she anticipates. Kendra [name] was able to understand this idea and expressed agreement. Kendra [name] reported she did not have any issues she wished to discuss today, and asked to end the session a bit early. LCSW expressed understanding and agreement.   Suicidal/Homicidal: No  Therapist Response: Kendra [name] continues to work towards her tx goals but has not yet reached them. We will continue to work on improving distress tolerance and CBT skills moving forward.   Plan: Return again in 2 weeks.  Diagnosis: Axis I: Bipolar 1 Disorder    Axis II: No diagnosis    Alden Hipp, LCSW 10/06/2019

## 2019-10-09 ENCOUNTER — Encounter: Payer: Self-pay | Admitting: Psychiatry

## 2019-10-09 ENCOUNTER — Ambulatory Visit: Payer: Medicare Other | Admitting: Family Medicine

## 2019-10-09 ENCOUNTER — Ambulatory Visit (INDEPENDENT_AMBULATORY_CARE_PROVIDER_SITE_OTHER): Payer: Medicare Other | Admitting: Psychiatry

## 2019-10-09 ENCOUNTER — Other Ambulatory Visit: Payer: Self-pay

## 2019-10-09 DIAGNOSIS — F3176 Bipolar disorder, in full remission, most recent episode depressed: Secondary | ICD-10-CM

## 2019-10-09 DIAGNOSIS — F5105 Insomnia due to other mental disorder: Secondary | ICD-10-CM | POA: Diagnosis not present

## 2019-10-09 DIAGNOSIS — F09 Unspecified mental disorder due to known physiological condition: Secondary | ICD-10-CM | POA: Diagnosis not present

## 2019-10-09 DIAGNOSIS — F411 Generalized anxiety disorder: Secondary | ICD-10-CM

## 2019-10-09 MED ORDER — TRAZODONE HCL 50 MG PO TABS: 25.0000 mg | ORAL_TABLET | Freq: Every evening | ORAL | 1 refills | Status: DC | PRN

## 2019-10-09 NOTE — Progress Notes (Signed)
Virtual Visit via Video Note  I connected with Kendra [name] on 10/09/19 at 11:00 AM EST by a video enabled telemedicine application and verified that I am speaking with the correct person using two identifiers.   I discussed the limitations of evaluation and management by telemedicine and the availability of in person appointments. The patient expressed understanding and agreed to proceed.     I discussed the assessment and treatment plan with the patient. The patient was provided an opportunity to ask questions and all were answered. The patient agreed with the plan and demonstrated an understanding of the instructions.   The patient was advised to call back or seek an in-person evaluation if the symptoms worsen or if the condition fails to improve as anticipated.   [facility] MD OP Progress Note  10/09/2019 12:10 PM Kendra [name]  MRN:  [id number]  Chief Complaint:  Chief Complaint    Follow-up     HPI: Kendra [name] is a 73 year old Caucasian female, married, lives in [place], has a history of bipolar disorder, GAD, insomnia, cognitive disorder, multifocal stage I right breast cancer status post treatment-lumpectomy, ovarian cyst removal was evaluated by telemetry medicine today.  Patient today reports she continues to struggle with sleep problems.  It is more so because of her pain which could also be contributing to it.  She was waiting for an injection for her pain and has upcoming appointment on Wednesday.  She looks forward to that.  She reports mood is stable.  She denies any significant mood swings.  She denies any perceptual disturbances.  She reports appetite is good.  She appeared to be alert, oriented to person place time and situation.  She continues to be compliant on medications and denies side effects.  Her husband continues to be supportive and she also has social support from family and friends.  She denies any other concerns today.   Visit Diagnosis:    ICD-10-CM    1. Bipolar disorder, in full remission, most recent episode depressed ([place])  F31.76   2. GAD (generalized anxiety disorder)  F41.1   3. Insomnia due to mental condition  F51.05 traZODone (DESYREL) 50 MG tablet  4. Mild cognitive disorder  F09     Past Psychiatric History: I have reviewed past psychiatric history from my progress note on 03/01/2019.  Past trials of Prozac, risperidone, Seroquel, Ativan.  Past Medical History:  Past Medical History:  Diagnosis Date  . Anxiety   . Breast cancer ([place]) 03/2018   right breast cancer at 11:00 and 1:00  . Bursitis    bilateral hips and knees  . Complication of anesthesia   . Depression   . Family history of breast cancer 03/24/2018  . Family history of lung cancer 03/24/2018  . History of alcohol dependence ([place]) 2007   no ETOH; resolved since 2007  . History of hiatal hernia   . History of psychosis 2014   due to sleep disturbance  . Hypertension   . Personal history of radiation therapy 06/2018-07/2018   right breast ca  . PONV (postoperative nausea and vomiting)     Past Surgical History:  Procedure Laterality Date  . BREAST BIOPSY Right 03/14/2018   11:00 DCIS and invasive ductal carcinoma  . BREAST BIOPSY Right 03/14/2018   1:00 Invasive ductal carcinoma  . BREAST LUMPECTOMY Right 04/27/2018   lumpectomy of 11 and 1:00 cancers, clear margins, negative LN  . BREAST LUMPECTOMY WITH RADIOACTIVE SEED AND SENTINEL LYMPH NODE BIOPSY Right 04/27/2018  Procedure: RIGHT BREAST LUMPECTOMY WITH RADIOACTIVE SEED X 2 AND RIGHT SENTINEL LYMPH NODE BIOPSY;  Surgeon: [name], MD;  Location: [facility];  Service: General;  Laterality: Right;  . CATARACT EXTRACTION W/PHACO Left 08/30/2019   Procedure: CATARACT EXTRACTION PHACO AND INTRAOCULAR LENS PLACEMENT ([place]) LEFT panoptix toric  01:20.5  12.7%  10.26;  Surgeon: Leandrew Koyanagi, MD;  Location: [facility];  Service: Ophthalmology;  Laterality: Left;  .  CATARACT EXTRACTION W/PHACO Right 09/20/2019   Procedure: CATARACT EXTRACTION PHACO AND INTRAOCULAR LENS PLACEMENT ([facility]) RIGHT 5.71  00:36.4  15.7%;  Surgeon: Leandrew Koyanagi, MD;  Location: [facility];  Service: Ophthalmology;  Laterality: Right;  . LAPAROTOMY N/A 02/10/2018   Procedure: EXPLORATORY LAPAROTOMY;  Surgeon: Isabel Caprice, MD;  [facility];  Service: Gynecology;  Laterality: N/A;  . MASS EXCISION  01/2018   abdominal  . OVARIAN CYST REMOVAL    . SALPINGOOPHORECTOMY Bilateral 02/10/2018   Procedure: BILATERAL SALPINGO OOPHORECTOMY; PERITONEAL WASHINGS;  Surgeon: Isabel Caprice, MD;  [facility];  Service: Gynecology;  Laterality: Bilateral;  . TONSILLECTOMY    . TONSILLECTOMY AND ADENOIDECTOMY  1953    Family Psychiatric History: I have reviewed family psychiatric history from my progress note on 03/01/2019.  Family History:  Family History  Problem Relation Age of Onset  . Diabetes Mother   . Breast cancer Mother 19  . Hypertension Mother   . Lung cancer Father        asbestos exposure  . Heart disease Brother 39  . Breast cancer Cousin 38       paternal cousin  . Heart disease Paternal Grandmother 33  . Heart disease Paternal Grandfather     Social History: I have reviewed social history from my progress note on 03/01/2019. Social History   Socioeconomic History  . Marital status: Married    Spouse name: robert  . Number of children: 0  . Years of education: Not on file  . Highest education level: Some college, no degree  Occupational History  . Occupation: retired Furniture conservator/restorer of education  Social Needs  . Financial resource strain: Not hard at all  . Food insecurity    Worry: Never true    Inability: Never true  . Transportation needs    Medical: No    Non-medical: No  Tobacco Use  . Smoking status: Never Smoker  . Smokeless tobacco: Never Used  Substance and Sexual Activity  . Alcohol use: Not Currently     Frequency: Never    Comment: alcohol dependence prior to 2007  . Drug use: Never  . Sexual activity: Yes    Partners: Male    Birth control/protection: Surgical  Lifestyle  . Physical activity    Days per week: 5 days    Minutes per session: 40 min  . Stress: Very much  Relationships  . Social Herbalist on phone: Not on file    Gets together: Not on file    Attends religious service: More than 4 times per year    Active member of club or organization: Yes    Attends meetings of clubs or organizations: 1 to 4 times per year    Relationship status: Married  Other Topics Concern  . Not on file  Social History Narrative  . Not on file    Allergies:  Allergies  Allergen Reactions  . Hydroxyzine Anaphylaxis    Tongue swollen  . Shrimp [Shellfish Allergy]  Other (See Comments)    Food poisoning     Metabolic Disorder Labs: Lab Results  Component Value Date   HGBA1C 5.4 04/13/2019   Lab Results  Component Value Date   PROLACTIN 101.9 06/20/2015   Lab Results  Component Value Date   CHOL 173 04/13/2019   TRIG 78 04/13/2019   HDL 62 04/13/2019   [place] 95 04/13/2019   LDLCALC 103 06/02/2017   Lab Results  Component Value Date   TSH 2.31 04/13/2019   TSH 2.543 07/25/2018    Therapeutic Level Labs: No results found for: LITHIUM No results found for: VALPROATE No components found for:  CBMZ  Current Medications: Current Outpatient Medications  Medication Sig Dispense Refill  . cyanocobalamin (,VITAMIN B-12,) 1000 MCG/ML injection Inject 1 mL (1,000 mcg total) into the muscle every 30 (thirty) days. 3 mL 3  . docusate sodium (COLACE) 100 MG capsule Take 1 capsule (100 mg total) by mouth daily. 30 capsule 3  . ERGOCALCIFEROL PO Take 2,400 mg by mouth daily.    Marland Kitchen FLUoxetine HCl 60 MG TABS Take 60 mg by mouth daily. 90 tablet 1  . letrozole (FEMARA) 2.5 MG tablet Take 1 tablet (2.5 mg total) by mouth daily. 90 tablet 3  . lisinopril-hydrochlorothiazide  (ZESTORETIC) 20-12.5 MG tablet Take 1 tablet by mouth daily. 90 tablet 3  . Melatonin 10 MG TABS Take 30 mg by mouth at bedtime.    . naproxen (NAPROSYN) 375 MG tablet     . naproxen sodium (ALEVE) 220 MG tablet Take 1 tablet (220 mg total) by mouth 2 (two) times daily as needed. 60 tablet 1  . risperiDONE (RISPERDAL) 3 MG tablet Take 0.5 tablets (1.5 mg total) by mouth 2 (two) times daily. 90 tablet 1  . traZODone (DESYREL) 50 MG tablet Take 0.5-1 tablets (25-50 mg total) by mouth at bedtime as needed for sleep. 15 tablet 1   No current facility-administered medications for this visit.      Musculoskeletal: Strength & Muscle Tone: UTA Gait & Station: normal Patient leans: N/A  Psychiatric Specialty Exam: Review of Systems  Musculoskeletal: Positive for joint pain and myalgias.  Psychiatric/Behavioral: The patient is nervous/anxious and has insomnia.   All other systems reviewed and are negative.   There were no vitals taken for this visit.There is no height or weight on file to calculate BMI.  General Appearance: Casual  Eye Contact:  Fair  Speech:  Clear and Coherent  Volume:  Normal  Mood:  Anxious due to pain  Affect:  Congruent  Thought Process:  Goal Directed and Descriptions of Associations: Intact  Orientation:  Full (Time, Place, and Person)  Thought Content: Logical   Suicidal Thoughts:  No  Homicidal Thoughts:  No  Memory:  Immediate;   Fair Recent;   Fair Remote;   Fair  Judgement:  Fair  Insight:  Fair  Psychomotor Activity:  Normal  Concentration:  Concentration: Fair and Attention Span: Fair  Recall:  [employer] of Knowledge: Fair  Language: Fair  Akathisia:  No  Handed:  Right  AIMS (if indicated): denies tremors, rigidity  Assets:  Communication Skills Desire for Improvement Social Support  ADL's:  Intact  Cognition: WNL  Sleep:  restless due to pain   Screenings: PHQ2-9     Office Visit from 11/24/2018 in [facility]  PHQ-2  Total Score  0  PHQ-9 Total Score  3       Assessment and Plan: Kendra [name] is a  73 year old Caucasian female, married, lives in [place], has a history of bipolar disorder, GAD, insomnia, cognitive disorder, multifocal stage I right breast cancer status post lumpectomy, ovarian cyst removal was evaluated by telemedicine today.  Patient is biologically predisposed given her multiple health problems.  She also has psychosocial stressors of recent COVID-19 pandemic.  Patient currently struggles with pain which also affects her sleep.  However will make medication readjustment and advised patient to continue psychotherapy sessions.  Plan as noted below.  Plan Bipolar disorder-in remission Risperidone 3 mg p.o. daily in divided dosage Prozac 60 mg p.o. daily  GAD-stable Continue psychotherapy sessions with Ms. Cecilie Lowers Continue Prozac  Insomnia-unstable Her sleep problems likely also due to pain Start trazodone 25 to 50 mg p.o. nightly as needed Advised patient to reduce the dosage of melatonin to 15 to 20 mg p.o. nightly.  She currently takes 30 mg.  For cognitive changes that she will continue to work with neurology.  Follow-up in clinic in 4 to 6 weeks or sooner if needed.  January 6 at 1 PM  I have spent atleast 15 minutes non face to face with patient today. More than 50 % of the time was spent for psychoeducation and supportive psychotherapy and care coordination. This note was generated in part or whole with voice recognition software. Voice recognition is usually quite accurate but there are transcription errors that can and very often do occur. I apologize for any typographical errors that were not detected and corrected.       Ursula Alert, MD 10/09/2019, 12:10 PM

## 2019-10-10 ENCOUNTER — Ambulatory Visit: Payer: Medicare Other | Admitting: Family Medicine

## 2019-10-10 ENCOUNTER — Ambulatory Visit: Payer: Medicare Other

## 2019-10-10 DIAGNOSIS — M25552 Pain in left hip: Secondary | ICD-10-CM

## 2019-10-10 DIAGNOSIS — R262 Difficulty in walking, not elsewhere classified: Secondary | ICD-10-CM

## 2019-10-10 DIAGNOSIS — M25551 Pain in right hip: Secondary | ICD-10-CM

## 2019-10-10 NOTE — Therapy (Signed)
[facility] [address] [place], [address] Phone: [phone]   Fax:  [phone]  Physical Therapy Treatment  Patient Details  Name: Kendra [name] MRN: [id number] Date of Birth: 01/17/1946 No data recorded  Encounter Date: 10/10/2019  PT End of Session - 10/10/19 1517    Visit Number  7    Number of Visits  17    Date for PT Re-Evaluation  11/07/19    Authorization Type  7 / 10    PT Start Time  1520    PT Stop Time  1600    PT Time Calculation (min)  40 min    Activity Tolerance  Patient tolerated treatment well;No increased pain    Behavior During Therapy  WFL for tasks assessed/performed       Past Medical History:  Diagnosis Date  . Anxiety   . Breast cancer ([facility]) 03/2018   right breast cancer at 11:00 and 1:00  . Bursitis    bilateral hips and knees  . Complication of anesthesia   . Depression   . Family history of breast cancer 03/24/2018  . Family history of lung cancer 03/24/2018  . History of alcohol dependence ([place]) 2007   no ETOH; resolved since 2007  . History of hiatal hernia   . History of psychosis 2014   due to sleep disturbance  . Hypertension   . Personal history of radiation therapy 06/2018-07/2018   right breast ca  . PONV (postoperative nausea and vomiting)     Past Surgical History:  Procedure Laterality Date  . BREAST BIOPSY Right 03/14/2018   11:00 DCIS and invasive ductal carcinoma  . BREAST BIOPSY Right 03/14/2018   1:00 Invasive ductal carcinoma  . BREAST LUMPECTOMY Right 04/27/2018   lumpectomy of 11 and 1:00 cancers, clear margins, negative LN  . BREAST LUMPECTOMY WITH RADIOACTIVE SEED AND SENTINEL LYMPH NODE BIOPSY Right 04/27/2018   Procedure: RIGHT BREAST LUMPECTOMY WITH RADIOACTIVE SEED X 2 AND RIGHT SENTINEL LYMPH NODE BIOPSY;  Surgeon: [name], MD;  Location: [facility];  Service: General;  Laterality: Right;  . CATARACT  EXTRACTION W/PHACO Left 08/30/2019   Procedure: CATARACT EXTRACTION PHACO AND INTRAOCULAR LENS PLACEMENT ([facility]) LEFT panoptix toric  01:20.5  12.7%  10.26;  Surgeon: Leandrew Koyanagi, MD;  Location: [facility];  Service: Ophthalmology;  Laterality: Left;  . CATARACT EXTRACTION W/PHACO Right 09/20/2019   Procedure: CATARACT EXTRACTION PHACO AND INTRAOCULAR LENS PLACEMENT ([facility]) RIGHT 5.71  00:36.4  15.7%;  Surgeon: Leandrew Koyanagi, MD;  Location: [facility];  Service: Ophthalmology;  Laterality: Right;  . LAPAROTOMY N/A 02/10/2018   Procedure: EXPLORATORY LAPAROTOMY;  Surgeon: Isabel Caprice, MD;  [facility];  Service: Gynecology;  Laterality: N/A;  . MASS EXCISION  01/2018   abdominal  . OVARIAN CYST REMOVAL    . SALPINGOOPHORECTOMY Bilateral 02/10/2018   Procedure: BILATERAL SALPINGO OOPHORECTOMY; PERITONEAL WASHINGS;  Surgeon: Isabel Caprice, MD;  [facility];  Service: Gynecology;  Laterality: Bilateral;  . TONSILLECTOMY    . TONSILLECTOMY AND ADENOIDECTOMY  1953    There were no vitals filed for this visit.  Subjective Assessment - 10/10/19 1520    Subjective  Patient reports she's not doing as well today. Reports new pain in R ankle and down RLE.    Pertinent History  Patient report unclear regarding onset. Visited MD on Sept 16 and received injection with relief for approximately 1 month. Reports she cannot  receive an additional injection pending eye surgery. Received dx from MD of hip bursitis in B hips. Reports pain with walking, stanindg, sitting 5/10 current 7/10 worst 3/10 best, with temporary relief when she is sitting "just right" and with naproxen. Agg: activity. Cormorbidities: hx of breast cancer, HTN, MCI, insomnia, cataracts    Limitations  Sitting;Standing;Walking;House hold activities    How long can you sit comfortably?  Pain constant, reduced with neutral pelvic tilt    How long can you stand comfortably?  Pain constant, increases with  time    How long can you walk comfortably?  Pain constant, increases with time    Patient Stated Goals  Be able to move without hurting, walk 1 hour    Currently in Pain?  Yes    Pain Score  5     Pain Location  Ankle    Pain Orientation  Right    Pain Descriptors / Indicators  Sore    Pain Type  Chronic pain    Pain Onset  More than a month ago    Multiple Pain Sites  No      Observation: Note antalgic gait over R. Pt c/o new ankle pain 5/10 with occasional N/T.  Examination: Ev: 18 deg. DF: -8 deg. Dorsal pedal pulse 2+. Note mild edema in retromalleolar space, across ankle dorsum. No increase in pain with AROM with OP in all planes or palpation over CF, ATF, PTF ligaments TTP 1+ over retromalleolar space, medial soleus Unable to provoke N/T  TREATMENT  MT TC joint mobilization A-P for increased DF grade 2-3 x 30 STJ ev mobilization grade 2-3 x 30 Ankle distraction with bias into eversion x 30 Provided education on appropriate footwear (tennis shoes rather than sandals)  MT for pain reduction, increased ROM for return to walking pain-free  TE Bridges without UE assist x 20 Seated marches x 20 R/L Ankle pumps x 20 BLE SLR x 10 R/L - note quad lag B Supine TKE over bolster x 20 R/L  Added ankle pumps, seated marches to HEP. Education on compression and elevation for reduction of edema.  TE for muscle pumping for edema reduction and maintenance of strength gains    PT Education - 10/10/19 1516    Education provided  Yes    Education Details  form/technique with exercise; compression/elevation for edema reduction; HEP    Person(s) Educated  Patient    Methods  Explanation;Demonstration;Tactile cues;Verbal cues;Handout    Comprehension  Verbalized understanding;Returned demonstration;Verbal cues required;Tactile cues required       PT Short Term Goals - 09/12/19 1419      PT SHORT TERM GOAL #1   Title  Patient will demonstrate complaince with HEP to speed  recovery and reduce total number of visits.    Baseline  HEP given    Time  2    Period  Weeks    Status  New    Target Date  09/27/19        PT Long Term Goals - 09/14/19 1742      PT LONG TERM GOAL #1   Title  Patient will improve hip strength to 4/5 grossly to facilitate improved mobility with ADLs.    Baseline  3, 4-    Time  8    Period  Weeks    Status  New      PT LONG TERM GOAL #2   Title  Patient will demonstrate walking tolerance increased to 30 minutes without increase in  symptoms to demonstrate improved ability to perform walking program for cardiovascular health.    Baseline  10 minute tolerance (patient report)    Time  8    Period  Weeks    Status  New      PT LONG TERM GOAL #3   Title  Patient will report worst pain of 3/10 during therapy to demonstrate reduced disability    Baseline  Worst 7/10    Time  8    Period  Weeks    Status  New      PT LONG TERM GOAL #4   Title  Patient will demonstrate ability to perform all bed mobility maneuvers without increase in pain for reduced disability with mobility and transfers with ADLs.    Baseline  Reports pain with bed mobility supine <> sidelying L/R, sidelying to sitting EOB    Time  8    Period  Weeks    Status  New      PT LONG TERM GOAL #5   Title  Patient will improve 10MW speed to 1.0 m/sec to meet norms for minimal fall risk and full community ambulation.    Baseline  0.88 m/sec    Time  8    Period  Weeks    Status  New    Target Date  11/09/19            Plan - 10/10/19 1608    Clinical Impression Statement  Note acute exacerbation with new R ankle pain. Report of ankle pain across dorsum with WB. Examination significant for reduced ROM and mild edema and TTP over soleus. No clinical suspicion of DVT, hx negative for vascular compromise, and no suspicion of significant ligamentous or musculotendinous injury at this time. However, patient reports elevated pain and concern over ankle. Provided  education for management of edema, nature of mild acute exacerbations during course of therapy, and benefit of active recovery strategies. Patient expressed comprehension. Following manual therapy patient reported less pain with walking. Patient will benefit from skilled physical therapy to increase activity tolerance and decrease pain.    Personal Factors and Comorbidities  Age;Comorbidity 3+;Time since onset of injury/illness/exacerbation    Comorbidities  MCI, HTN, CA, cataracts    Examination-Activity Limitations  Bend;Sit;Squat;Stairs;Stand;Sleep    Examination-Participation Restrictions  Cleaning;Community Activity    Stability/Clinical Decision Making  Evolving/Moderate complexity    Rehab Potential  Fair    Clinical Impairments Affecting Rehab Potential  MCI    PT Frequency  2x / week   Eval and 1 f/u visit   PT Treatment/Interventions  ADLs/Self Care Home Management;Therapeutic exercise;Patient/family education;Neuromuscular re-education;Therapeutic activities;Functional mobility training;Balance training;Manual techniques;Gait training;Stair training;Moist Heat;Electrical Stimulation;Cryotherapy;Taping;Dry needling;Energy conservation;Passive range of motion;Joint Manipulations    PT Next Visit Plan  Progress hip therex; examine patellar N/T onset with walking    PT Home Exercise Plan  SKTC, bridges, hamstring stretch, SLR, walking 15 min, ankle pumps, seated marches    Consulted and Agree with Plan of Care  Patient       Patient will benefit from skilled therapeutic intervention in order to improve the following deficits and impairments:  Decreased range of motion, Postural dysfunction, Pain, Abnormal gait, Decreased activity tolerance, Decreased coordination, Decreased endurance, Decreased balance, Decreased mobility, Difficulty walking, Hypomobility, Increased muscle spasms, Impaired perceived functional ability, Decreased strength, Impaired flexibility  Visit Diagnosis: Pain in left  hip  Pain in right hip  Difficulty in walking, not elsewhere classified     Problem List Patient Active  Problem List   Diagnosis Date Noted  . Avitaminosis D 08/24/2019  . Insomnia due to mental condition 05/31/2019  . Bipolar I disorder, most recent episode depressed ([place]) 05/31/2019  . Mild cognitive disorder 05/31/2019  . Alcohol use disorder, moderate, in sustained remission ([place]) 05/31/2019  . Elevated tumor markers 05/27/2019  . GAD (generalized anxiety disorder) 04/10/2019  . Altered mental status 04/10/2019  . MDD (major depressive disorder), severe ([place]) 01/31/2019  . High serum carbohydrate antigen 19-9 (CA19-9) 12/01/2018  . Adjustment disorder with anxious mood 11/24/2018  . Essential hypertension 11/24/2018  . Screening for lipid disorders 11/24/2018  . B12 deficiency 10/17/2018  . Incisional hernia, without obstruction or gangrene 09/12/2018  . Genetic testing 04/07/2018  . Family history of breast cancer 03/24/2018  . Family history of lung cancer 03/24/2018  . Malignant neoplasm of upper-outer quadrant of right breast in female, estrogen receptor positive ([place]) 03/17/2018  . Malignant neoplasm of upper-inner quadrant of right breast in female, estrogen receptor positive ([place]) 03/17/2018  . History of alcohol dependence ([place]) 02/07/2018  . History of psychosis 02/07/2018  . History of insomnia 02/07/2018    Virgia Land, SPT 10/10/2019, 4:32 PM  [facility] [address], [place], [address] Phone: [phone]   Fax:  [phone]  Name: Kendra [name] MRN: [id number] Date of Birth: [birth year]

## 2019-10-11 ENCOUNTER — Encounter: Payer: Self-pay | Admitting: Family Medicine

## 2019-10-11 ENCOUNTER — Other Ambulatory Visit: Payer: Self-pay

## 2019-10-11 ENCOUNTER — Ambulatory Visit (INDEPENDENT_AMBULATORY_CARE_PROVIDER_SITE_OTHER): Payer: Medicare Other | Admitting: Family Medicine

## 2019-10-11 VITALS — BP 132/82 | HR 75 | Temp 97.3°F | Wt 198.0 lb

## 2019-10-11 DIAGNOSIS — M7061 Trochanteric bursitis, right hip: Secondary | ICD-10-CM | POA: Diagnosis not present

## 2019-10-11 DIAGNOSIS — M7062 Trochanteric bursitis, left hip: Secondary | ICD-10-CM

## 2019-10-11 MED ORDER — METHYLPREDNISOLONE ACETATE 40 MG/ML IJ SUSP: 40.0000 mg | Freq: Once | INTRAMUSCULAR | Status: DC

## 2019-10-11 MED ORDER — LIDOCAINE HCL (PF) 1 % IJ SOLN: 4.0000 mL | Freq: Once | INTRAMUSCULAR | Status: DC

## 2019-10-11 NOTE — Patient Instructions (Signed)
Hip Bursitis  Hip bursitis is swelling of a fluid-filled sac (bursa) in your hip joint. This swelling (inflammation) can be painful. This condition may come and go over time. What are the causes?  Injury to the hip.  Overuse of the muscles that surround the hip joint.  An earlier injury or surgery of the hip.  Arthritis or gout.  Diabetes.  Thyroid disease.  Infection.  In some cases, the cause may not be known. What are the signs or symptoms?  Mild or moderate pain in the hip area. Pain may get worse with movement.  Tenderness and swelling of the hip, especially on the outer side of the hip.  In rare cases, the bursa may become infected. This may cause: ? A fever. ? Warmth and redness in the area. Symptoms may come and go. How is this treated? This condition is treated by resting, icing, applying pressure (compression), and raising (elevating) the injured area. You may hear this called the RICE treatment. Treatment may also include:  Using crutches.  Draining fluid out of the bursa to help relieve swelling.  Giving a shot of (injecting) medicine that helps to reduce swelling (cortisone).  Other medicines if the bursa is infected. Follow these instructions at home: Managing pain, stiffness, and swelling   If told, put ice on the painful area. ? Put ice in a plastic bag. ? Place a towel between your skin and the bag. ? Leave the ice on for 20 minutes, 2-3 times a day. ? Raise (elevate) your hip above the level of your heart as much as you can without pain. To do this, try putting a pillow under your hips while you lie down. Stop if this causes pain. Activity  Return to your normal activities as told by your doctor. Ask your doctor what activities are safe for you.  Rest and protect your hip as much as you can until you feel better. General instructions  Take over-the-counter and prescription medicines only as told by your doctor.  Wear wraps that put pressure  on your hip (compression wraps) only as told by your doctor.  Do not use your hip to support your body weight until your doctor says that you can.  Use crutches as told by your doctor.  Gently rub and stretch your injured area as often as is comfortable.  Keep all follow-up visits as told by your doctor. This is important. How is this prevented?  Exercise regularly, as told by your doctor.  Warm up and stretch before being active.  Cool down and stretch after being active.  Avoid activities that bother your hip or cause pain.  Avoid sitting down for long periods at a time. Contact a doctor if:  You have a fever.  You get new symptoms.  You have trouble walking.  You have trouble doing everyday activities.  You have pain that gets worse.  You have pain that does not get better with medicine.  You get red skin on your hip area.  You get a feeling of warmth in your hip area. Get help right away if:  You cannot move your hip.  You have very bad pain. Summary  Hip bursitis is swelling of a fluid-filled sac (bursa) in your hip.  Hip bursitis can be painful.  Symptoms often come and go over time.  This condition is treated with rest, ice, compression, elevation, and medicines. This information is not intended to replace advice given to you by your health care provider.   Make sure you discuss any questions you have with your health care provider. Document Released: 11/21/2010 Document Revised: 06/27/2018 Document Reviewed: 06/27/2018 Elsevier Patient Education  2020 [employer].

## 2019-10-11 NOTE — Progress Notes (Signed)
Patient: Kendra [name] Female    DOB: Mar 18, 1946   73 y.o.   MRN: [id number] Visit Date: 10/11/2019  Today's Provider: Lavon Paganini, MD   Chief Complaint  Patient presents with  . Hip Pain    Bilateral   Subjective:     Hip Pain  There was no injury mechanism. The pain is present in the left hip and right hip. The quality of the pain is described as aching. The pain has been constant since onset. Associated symptoms include an inability to bear weight, numbness (Right foot) and tingling (Right foot). Pertinent negatives include no loss of sensation or muscle weakness. The symptoms are aggravated by palpation and weight bearing.   She recently had eye surgery and Optho has cleared her to have steroids at this time.  Feels like a toothache. Hurts worse with sitting for long period or laying on either side.  She has been seeing PT 2x/wk for last 2 months.  Feels like the shots were helpful last time (3 months ago) and she would like to repeat the injections.  No groin pain.    Allergies  Allergen Reactions  . Hydroxyzine Anaphylaxis    Tongue swollen  . Shrimp [Shellfish Allergy] Other (See Comments)    Food poisoning      Current Outpatient Medications:  .  cyanocobalamin (,VITAMIN B-12,) 1000 MCG/ML injection, Inject 1 mL (1,000 mcg total) into the muscle every 30 (thirty) days., Disp: 3 mL, Rfl: 3 .  docusate sodium (COLACE) 100 MG capsule, Take 1 capsule (100 mg total) by mouth daily., Disp: 30 capsule, Rfl: 3 .  ERGOCALCIFEROL PO, Take 2,400 mg by mouth daily., Disp: , Rfl:  .  FLUoxetine HCl 60 MG TABS, Take 60 mg by mouth daily., Disp: 90 tablet, Rfl: 1 .  letrozole (FEMARA) 2.5 MG tablet, Take 1 tablet (2.5 mg total) by mouth daily., Disp: 90 tablet, Rfl: 3 .  lisinopril-hydrochlorothiazide (ZESTORETIC) 20-12.5 MG tablet, Take 1 tablet by mouth daily., Disp: 90 tablet, Rfl: 3 .  Melatonin 10 MG TABS, Take 30 mg by mouth at bedtime., Disp: , Rfl:  .  naproxen  (NAPROSYN) 375 MG tablet, , Disp: , Rfl:  .  naproxen sodium (ALEVE) 220 MG tablet, Take 1 tablet (220 mg total) by mouth 2 (two) times daily as needed., Disp: 60 tablet, Rfl: 1 .  risperiDONE (RISPERDAL) 3 MG tablet, Take 0.5 tablets (1.5 mg total) by mouth 2 (two) times daily., Disp: 90 tablet, Rfl: 1 .  traZODone (DESYREL) 50 MG tablet, Take 0.5-1 tablets (25-50 mg total) by mouth at bedtime as needed for sleep. (Patient not taking: Reported on 10/11/2019), Disp: 15 tablet, Rfl: 1  Review of Systems  Constitutional: Negative.   Respiratory: Negative.   Cardiovascular: Positive for leg swelling. Negative for chest pain and palpitations.  Musculoskeletal: Positive for arthralgias and myalgias. Negative for back pain, gait problem, joint swelling, neck pain and neck stiffness.  Neurological: Positive for tingling (Right foot) and numbness (Right foot). Negative for dizziness, light-headedness and headaches.    Social History   Tobacco Use  . Smoking status: Never Smoker  . Smokeless tobacco: Never Used  Substance Use Topics  . Alcohol use: Not Currently    Frequency: Never    Comment: alcohol dependence prior to 2007      Objective:   BP 132/82 (BP Location: Left Arm, Patient Position: Sitting, Cuff Size: Large)   Pulse 75   Temp (!) 97.3 F (36.3 C) (  Temporal)   Wt 198 lb (89.8 kg)   BMI 31.96 kg/m  Vitals:   10/11/19 1323  BP: 132/82  Pulse: 75  Temp: (!) 97.3 F (36.3 C)  TempSrc: Temporal  Weight: 198 lb (89.8 kg)  Body mass index is 31.96 kg/m.   Physical Exam Vitals signs reviewed.  Constitutional:      General: She is not in acute distress.    Appearance: Normal appearance. She is not diaphoretic.  Eyes:     General: No scleral icterus.    Conjunctiva/sclera: Conjunctivae normal.  Cardiovascular:     Rate and Rhythm: Normal rate and regular rhythm.     Pulses: Normal pulses.     Heart sounds: Normal heart sounds. No murmur.  Pulmonary:     Effort:  Pulmonary effort is normal. No respiratory distress.     Breath sounds: Normal breath sounds. No wheezing or rhonchi.  Musculoskeletal:     Right lower leg: No edema.     Left lower leg: No edema.     Comments: Bilateral Hip: ROM intact and symmetric Strength symmetric and 4+ out of 5 in all directions Pelvic alignment unremarkable to inspection and palpation. Greater trochanter with tenderness to palpation. No tenderness over piriformis. No SI joint tenderness and normal minimal SI movement.  No pain with internal and external rotation  Skin:    General: Skin is warm and dry.     Findings: No rash.  Neurological:     Mental Status: She is alert and oriented to person, place, and time. Mental status is at baseline.     Gait: Gait abnormal (slow and antalgic).  Psychiatric:        Mood and Affect: Mood normal.        Behavior: Behavior normal.      No results found for any visits on 10/11/19.     Assessment & Plan    1. Greater trochanteric bursitis of both hips -Recurrent problem -No evidence of intra-articular hip issue as she does not have any groin pain or issues with internal or external rotation of her lower extremities -Responded well to greater trochanteric steroid injections previously -Continue physical therapy -Discussed importance of strengthening her lower extremities and leveling her gait -Discussed ice, NSAIDs -Will repeat injections at this time -If she gets no relief from the injections and continued physical therapy, consider Ortho referral -Discussed return precautions   INJECTION: Patient was given informed consent,. Appropriate time out was taken. Area prepped and draped in usual sterile fashion.  1 cc of depo-medrol 40 mg/ml plus 4 cc of 1% lidocaine without epinephrine was injected into each greater trochanter bursa using a(n) lateral approach. The patient tolerated the procedure well. There were no complications. Post procedure instructions were  given.    Meds ordered this encounter  Medications  . lidocaine (PF) (XYLOCAINE) 1 % injection 4 mL  . lidocaine (PF) (XYLOCAINE) 1 % injection 4 mL  . methylPREDNISolone acetate (DEPO-MEDROL) injection 40 mg  . methylPREDNISolone acetate (DEPO-MEDROL) injection 40 mg     The entirety of the information documented in the History of Present Illness, Review of Systems and Physical Exam were personally obtained by me. Portions of this information were initially documented by Ashley Royalty, CMA and reviewed by me for thoroughness and accuracy.    Vira Chaplin, Dionne Bucy, MD MPH [facility]

## 2019-10-13 ENCOUNTER — Encounter: Payer: Self-pay | Admitting: Licensed Clinical Social Worker

## 2019-10-13 ENCOUNTER — Other Ambulatory Visit: Payer: Self-pay

## 2019-10-13 ENCOUNTER — Ambulatory Visit (INDEPENDENT_AMBULATORY_CARE_PROVIDER_SITE_OTHER): Payer: Medicare Other | Admitting: Licensed Clinical Social Worker

## 2019-10-13 DIAGNOSIS — F3176 Bipolar disorder, in full remission, most recent episode depressed: Secondary | ICD-10-CM

## 2019-10-13 NOTE — Progress Notes (Signed)
  Virtual Visit via Telephone Note  I connected with Kendra [name] on 10/13/19 at 10:00 AM EST by telephone and verified that I am speaking with the correct person using two identifiers.   I discussed the limitations, risks, security and privacy concerns of performing an evaluation and management service by telephone and the availability of in person appointments. I also discussed with the patient that there may be a patient responsible charge related to this service. The patient expressed understanding and agreed to proceed.  I discussed the assessment and treatment plan with the patient. The patient was provided an opportunity to ask questions and all were answered. The patient agreed with the plan and demonstrated an understanding of the instructions.   The patient was advised to call back or seek an in-person evaluation if the symptoms worsen or if the condition fails to improve as anticipated.  I provided 30 minutes of non-face-to-face time during this encounter.   Alden Hipp, LCSW   THERAPIST PROGRESS NOTE  Session Time: 1000 Participation Level: Active  Behavioral Response: NeatAlertAnxious  Type of Therapy: Individual Therapy  Treatment Goals addressed: Coping  Interventions: Supportive  Summary: Kendra [name] is a 73 y.o. female who presents with continued symptoms related to her diagnosis. Kendra [name] reports doing well since our last session. She reports since our last conversation, however, she rolled her ankle and has been in a great amount of physical pain. LCSW validated Kendra [name]'s feelings around this situation, and held space for her to discuss the far reaching issues that her ankle has caused.  Kendra [name] reported, other than her physical pain, things have gone well. She reports having a bad dream--which she no longer remembers--which woke her up, and she utilized evidence for/evidence against the dream being real which made her feel better in the moment. LCSW validated Kendra [name]'s  use of this coping skill and encouraged her to continue doing so. Kendra [name] reports otherwise, she is doing very well. LCSW held space for Kendra [name] to discuss other things going on in her life and encouraged her to continue making lists in order to navigate her feelings around things over the holidays. Kendra [name] expressed understanding and agreement.   Suicidal/Homicidal: No   Therapist Response: Kendra [name] continues to work towards her tx goals but has not yet reached them. We will continue to work on improving emotional regulation skills and distress tolerance moving forward. We will also work on improving CBT skills moving forward.   Plan: Return again in 2 weeks.  Diagnosis: Axis I: Bipolar, mixed    Axis II: No diagnosis    Alden Hipp, LCSW 10/13/2019

## 2019-10-17 ENCOUNTER — Other Ambulatory Visit: Payer: Self-pay

## 2019-10-17 ENCOUNTER — Ambulatory Visit: Payer: Medicare Other

## 2019-10-17 DIAGNOSIS — M25552 Pain in left hip: Secondary | ICD-10-CM | POA: Diagnosis not present

## 2019-10-17 DIAGNOSIS — R262 Difficulty in walking, not elsewhere classified: Secondary | ICD-10-CM

## 2019-10-17 DIAGNOSIS — M25551 Pain in right hip: Secondary | ICD-10-CM

## 2019-10-17 NOTE — Therapy (Signed)
[facility] [address] [place], [address] Phone: [phone]   Fax:  [phone]  Physical Therapy Treatment  Patient Details  Name: Kendra [name] MRN: [id number] Date of Birth: [birth year] No data recorded  Encounter Date: 10/17/2019  PT End of Session - 10/17/19 1030    Visit Number  8    Number of Visits  17    Date for PT Re-Evaluation  11/07/19    Authorization Type  8 / 10    PT Start Time  1031    PT Stop Time  1115    PT Time Calculation (min)  44 min    Activity Tolerance  Patient tolerated treatment well;No increased pain    Behavior During Therapy  WFL for tasks assessed/performed       Past Medical History:  Diagnosis Date  . Anxiety   . Breast cancer ([place]) 03/2018   right breast cancer at 11:00 and 1:00  . Bursitis    bilateral hips and knees  . Complication of anesthesia   . Depression   . Family history of breast cancer 03/24/2018  . Family history of lung cancer 03/24/2018  . History of alcohol dependence ([place]) 2007   no ETOH; resolved since 2007  . History of hiatal hernia   . History of psychosis 2014   due to sleep disturbance  . Hypertension   . Personal history of radiation therapy 06/2018-07/2018   right breast ca  . PONV (postoperative nausea and vomiting)     Past Surgical History:  Procedure Laterality Date  . BREAST BIOPSY Right 03/14/2018   11:00 DCIS and invasive ductal carcinoma  . BREAST BIOPSY Right 03/14/2018   1:00 Invasive ductal carcinoma  . BREAST LUMPECTOMY Right 04/27/2018   lumpectomy of 11 and 1:00 cancers, clear margins, negative LN  . BREAST LUMPECTOMY WITH RADIOACTIVE SEED AND SENTINEL LYMPH NODE BIOPSY Right 04/27/2018   Procedure: RIGHT BREAST LUMPECTOMY WITH RADIOACTIVE SEED X 2 AND RIGHT SENTINEL LYMPH NODE BIOPSY;  Surgeon: [name], MD;  Location: [facility];  Service: General;  Laterality: Right;  . CATARACT  EXTRACTION W/PHACO Left 08/30/2019   Procedure: CATARACT EXTRACTION PHACO AND INTRAOCULAR LENS PLACEMENT ([place]) LEFT panoptix toric  01:20.5  12.7%  10.26;  Surgeon: Leandrew Koyanagi, MD;  Location: [facility];  Service: Ophthalmology;  Laterality: Left;  . CATARACT EXTRACTION W/PHACO Right 09/20/2019   Procedure: CATARACT EXTRACTION PHACO AND INTRAOCULAR LENS PLACEMENT ([facility]) RIGHT 5.71  00:36.4  15.7%;  Surgeon: Leandrew Koyanagi, MD;  Location: [employer] [facility];  Service: Ophthalmology;  Laterality: Right;  . LAPAROTOMY N/A 02/10/2018   Procedure: EXPLORATORY LAPAROTOMY;  Surgeon: Isabel Caprice, MD;  Location: [facility];  Service: Gynecology;  Laterality: N/A;  . MASS EXCISION  01/2018   abdominal  . OVARIAN CYST REMOVAL    . SALPINGOOPHORECTOMY Bilateral 02/10/2018   Procedure: BILATERAL SALPINGO OOPHORECTOMY; PERITONEAL WASHINGS;  Surgeon: Isabel Caprice, MD;  Location: [facility];  Service: Gynecology;  Laterality: Bilateral;  . TONSILLECTOMY    . TONSILLECTOMY AND ADENOIDECTOMY  1953    There were no vitals filed for this visit.  Subjective Assessment - 10/17/19 1032    Subjective  Patient reports getting injections in both hips but without relief of pain. Reports that she's in pain today. Reports "bad step" when getting out of car.    Pertinent History  Patient report unclear regarding onset. Visited MD on Sept 16 and received injection  with relief for approximately 1 month. Reports she cannot receive an additional injection pending eye surgery. Received dx from MD of hip bursitis in B hips. Reports pain with walking, stanindg, sitting 5/10 current 7/10 worst 3/10 best, with temporary relief when she is sitting "just right" and with naproxen. Agg: activity. Cormorbidities: hx of breast cancer, HTN, MCI, insomnia, cataracts    Limitations  Sitting;Standing;Walking;House hold activities    How long can you sit comfortably?  Pain constant, reduced with neutral pelvic tilt     How long can you stand comfortably?  Pain constant, increases with time    How long can you walk comfortably?  Pain constant, increases with time    Patient Stated Goals  Be able to move without hurting, walk 1 hour    Currently in Pain?  Yes    Pain Score  9     Pain Orientation  Right;Left    Pain Onset  More than a month ago         Observation: Examination: Ev: 20 deg. DF: 2 deg. Dorsal pedal pulse 2+. Note mild edema in retromalleolar space, across ankle dorsum. No increase in pain with AROM with OP in all planes or palpation over CF, ATF, PTF ligaments Talar tilt negative. No TTP reported. Unable to provoke N/T  TREATMENT  MT TC joint mobilization A-P for increased DF grade 4 x 30 pt reports uncomfortable "stretch" Ankle distraction with bias into eversion x 30  MT for pain relief and increased ankle ROM   TE Ankle 4-way AROM against manual resistance x 10 Ambulation x 150 ft note Trendelenberg and minor pronation RLE Weight transfer onto stairs x 20 R/L Step ups x 10 R/L Stairs ascending/descending 8" riser 4 steps - reciprocal gait pattern, note Trendelenberg R>L, BUE assist for balance Lateral steps 30 ft x 4 - cues to avoid rotation for TFL compensation Lateral steps against red tb resistance - note reduced excursion, unable to correct with cues STS 2 x 5  TE for BLE strengthening with functional activity    PT Education - 10/17/19 1030    Education provided  Yes    Education Details  form/technique with exercise    Person(s) Educated  Patient    Methods  Explanation;Demonstration;Tactile cues;Verbal cues    Comprehension  Verbal cues required;Tactile cues required;Returned demonstration;Verbalized understanding       PT Short Term Goals - 09/12/19 1419      PT SHORT TERM GOAL #1   Title  Patient will demonstrate complaince with HEP to speed recovery and reduce total number of visits.    Baseline  HEP given    Time  2    Period  Weeks    Status   New    Target Date  09/27/19        PT Long Term Goals - 09/14/19 1742      PT LONG TERM GOAL #1   Title  Patient will improve hip strength to 4/5 grossly to facilitate improved mobility with ADLs.    Baseline  3, 4-    Time  8    Period  Weeks    Status  New      PT LONG TERM GOAL #2   Title  Patient will demonstrate walking tolerance increased to 30 minutes without increase in symptoms to demonstrate improved ability to perform walking program for cardiovascular health.    Baseline  10 minute tolerance (patient report)    Time  8  Period  Weeks    Status  New      PT LONG TERM GOAL #3   Title  Patient will report worst pain of 3/10 during therapy to demonstrate reduced disability    Baseline  Worst 7/10    Time  8    Period  Weeks    Status  New      PT LONG TERM GOAL #4   Title  Patient will demonstrate ability to perform all bed mobility maneuvers without increase in pain for reduced disability with mobility and transfers with ADLs.    Baseline  Reports pain with bed mobility supine <> sidelying L/R, sidelying to sitting EOB    Time  8    Period  Weeks    Status  New      PT LONG TERM GOAL #5   Title  Patient will improve 10MW speed to 1.0 m/sec to meet norms for minimal fall risk and full community ambulation.    Baseline  0.88 m/sec    Time  8    Period  Weeks    Status  New    Target Date  11/09/19            Plan - 10/17/19 1245    Clinical Impression Statement  Pt report of ankle pain inconsistent with gait analysis. Note edema in RLE ankle but pre-treatment ROM measurements improved relative to last session. Patient responds well to manual therapy and is able to perform stairs and ambulation with reciprocal gait with no antalgic elements. Deficits remain in lateral hip strength evidenced by Trendelenberg. An episode of mild confusion is noted with patient requiring assistance to find her way out of clinic, something she has not needed in the past. Given  pt's PMH pain report may be correlated with fluctuations cognitive status. Patient will benefit from skilled physical therapy to increase activity tolerance and decrease pain.    Personal Factors and Comorbidities  Age;Comorbidity 3+;Time since onset of injury/illness/exacerbation    Comorbidities  MCI, HTN, CA, cataracts    Examination-Activity Limitations  Bend;Sit;Squat;Stairs;Stand;Sleep    Examination-Participation Restrictions  Cleaning;Community Activity    Stability/Clinical Decision Making  Evolving/Moderate complexity    Rehab Potential  Fair    Clinical Impairments Affecting Rehab Potential  MCI    PT Frequency  2x / week   Eval and 1 f/u visit   PT Treatment/Interventions  ADLs/Self Care Home Management;Therapeutic exercise;Patient/family education;Neuromuscular re-education;Therapeutic activities;Functional mobility training;Balance training;Manual techniques;Gait training;Stair training;Moist Heat;Electrical Stimulation;Cryotherapy;Taping;Dry needling;Energy conservation;Passive range of motion;Joint Manipulations    PT Next Visit Plan  Progress hip therex; examine patellar N/T onset with walking    PT Home Exercise Plan  SKTC, bridges, hamstring stretch, SLR, walking 15 min, ankle pumps, seated marches    Consulted and Agree with Plan of Care  Patient       Patient will benefit from skilled therapeutic intervention in order to improve the following deficits and impairments:  Decreased range of motion, Postural dysfunction, Pain, Abnormal gait, Decreased activity tolerance, Decreased coordination, Decreased endurance, Decreased balance, Decreased mobility, Difficulty walking, Hypomobility, Increased muscle spasms, Impaired perceived functional ability, Decreased strength, Impaired flexibility  Visit Diagnosis: Pain in left hip  Pain in right hip  Difficulty in walking, not elsewhere classified     Problem List Patient Active Problem List   Diagnosis Date Noted  .  Avitaminosis D 08/24/2019  . Insomnia due to mental condition 05/31/2019  . Bipolar I disorder, most recent episode depressed ([place]) 05/31/2019  .  Mild cognitive disorder 05/31/2019  . Alcohol use disorder, moderate, in sustained remission ([place]) 05/31/2019  . Elevated tumor markers 05/27/2019  . GAD (generalized anxiety disorder) 04/10/2019  . Altered mental status 04/10/2019  . MDD (major depressive disorder), severe ([place]) 01/31/2019  . High serum carbohydrate antigen 19-9 (CA19-9) 12/01/2018  . Adjustment disorder with anxious mood 11/24/2018  . Essential hypertension 11/24/2018  . Screening for lipid disorders 11/24/2018  . B12 deficiency 10/17/2018  . Incisional hernia, without obstruction or gangrene 09/12/2018  . Genetic testing 04/07/2018  . Family history of breast cancer 03/24/2018  . Family history of lung cancer 03/24/2018  . Malignant neoplasm of upper-outer quadrant of right breast in female, estrogen receptor positive ([place]) 03/17/2018  . Malignant neoplasm of upper-inner quadrant of right breast in female, estrogen receptor positive ([place]) 03/17/2018  . History of alcohol dependence ([place]) 02/07/2018  . History of psychosis 02/07/2018  . History of insomnia 02/07/2018    Virgia Land, SPT 10/17/2019, 12:46 PM  [facility] [address], [place], [address] Phone: [phone]   Fax:  [phone]  Name: Kendra [name] MRN: [id number] Date of Birth: November 02, 1946

## 2019-10-19 ENCOUNTER — Other Ambulatory Visit: Payer: Self-pay

## 2019-10-19 ENCOUNTER — Ambulatory Visit: Payer: Medicare Other

## 2019-10-19 DIAGNOSIS — M25551 Pain in right hip: Secondary | ICD-10-CM

## 2019-10-19 DIAGNOSIS — R262 Difficulty in walking, not elsewhere classified: Secondary | ICD-10-CM

## 2019-10-19 DIAGNOSIS — M25552 Pain in left hip: Secondary | ICD-10-CM | POA: Diagnosis not present

## 2019-10-19 NOTE — Therapy (Signed)
[facility] [address], [place], [address] Phone: [phone]   Fax:  [phone]  Physical Therapy Treatment  Patient Details  Name: Kendra [name] MRN: [id number] Date of Birth: 01-20-1946 No data recorded  Encounter Date: 10/19/2019  PT End of Session - 10/19/19 1223    Visit Number  9    Number of Visits  17    Date for PT Re-Evaluation  11/07/19    Authorization Type  9 / 10    PT Start Time  0901    PT Stop Time  0945    PT Time Calculation (min)  44 min    Equipment Utilized During Treatment  Gait belt    Activity Tolerance  Patient tolerated treatment well;No increased pain    Behavior During Therapy  WFL for tasks assessed/performed       Past Medical History:  Diagnosis Date  . Anxiety   . Breast cancer ([place]) 03/2018   right breast cancer at 11:00 and 1:00  . Bursitis    bilateral hips and knees  . Complication of anesthesia   . Depression   . Family history of breast cancer 03/24/2018  . Family history of lung cancer 03/24/2018  . History of alcohol dependence ([place]) 2007   no ETOH; resolved since 2007  . History of hiatal hernia   . History of psychosis 2014   due to sleep disturbance  . Hypertension   . Personal history of radiation therapy 06/2018-07/2018   right breast ca  . PONV (postoperative nausea and vomiting)     Past Surgical History:  Procedure Laterality Date  . BREAST BIOPSY Right 03/14/2018   11:00 DCIS and invasive ductal carcinoma  . BREAST BIOPSY Right 03/14/2018   1:00 Invasive ductal carcinoma  . BREAST LUMPECTOMY Right 04/27/2018   lumpectomy of 11 and 1:00 cancers, clear margins, negative LN  . BREAST LUMPECTOMY WITH RADIOACTIVE SEED AND SENTINEL LYMPH NODE BIOPSY Right 04/27/2018   Procedure: RIGHT BREAST LUMPECTOMY WITH RADIOACTIVE SEED X 2 AND RIGHT SENTINEL LYMPH NODE BIOPSY;  Surgeon: [name], MD;  Location: [facility];   Service: General;  Laterality: Right;  . CATARACT EXTRACTION W/PHACO Left 08/30/2019   Procedure: CATARACT EXTRACTION PHACO AND INTRAOCULAR LENS PLACEMENT ([place]) LEFT panoptix toric  01:20.5  12.7%  10.26;  Surgeon: Leandrew Koyanagi, MD;  Location: [facility];  Service: Ophthalmology;  Laterality: Left;  . CATARACT EXTRACTION W/PHACO Right 09/20/2019   Procedure: CATARACT EXTRACTION PHACO AND INTRAOCULAR LENS PLACEMENT ([facility]) RIGHT 5.71  00:36.4  15.7%;  Surgeon: Leandrew Koyanagi, MD;  Location: [facility];  Service: Ophthalmology;  Laterality: Right;  . LAPAROTOMY N/A 02/10/2018   Procedure: EXPLORATORY LAPAROTOMY;  Surgeon: Isabel Caprice, MD;  Location: [facility];  Service: Gynecology;  Laterality: N/A;  . MASS EXCISION  01/2018   abdominal  . OVARIAN CYST REMOVAL    . SALPINGOOPHORECTOMY Bilateral 02/10/2018   Procedure: BILATERAL SALPINGO OOPHORECTOMY; PERITONEAL WASHINGS;  Surgeon: Isabel Caprice, MD;  [facility];  Service: Gynecology;  Laterality: Bilateral;  . TONSILLECTOMY    . TONSILLECTOMY AND ADENOIDECTOMY  1953    There were no vitals filed for this visit.  Subjective Assessment - 10/19/19 0905    Subjective  Patient reports that she is still feeling pain in ankle and hips.    Pertinent History  Patient report unclear regarding onset. Visited MD on Sept 16 and received injection with relief for approximately  1 month. Reports she cannot receive an additional injection pending eye surgery. Received dx from MD of hip bursitis in B hips. Reports pain with walking, stanindg, sitting 5/10 current 7/10 worst 3/10 best, with temporary relief when she is sitting "just right" and with naproxen. Agg: activity. Cormorbidities: hx of breast cancer, HTN, MCI, insomnia, cataracts    Limitations  Sitting;Standing;Walking;House hold activities    How long can you sit comfortably?  Pain constant, reduced with neutral pelvic tilt    How long can you stand comfortably?   Pain constant, increases with time    How long can you walk comfortably?  Pain constant, increases with time    Patient Stated Goals  Be able to move without hurting, walk 1 hour    Currently in Pain?  Yes    Pain Score  8     Pain Location  Ankle    Pain Orientation  Right    Pain Onset  More than a month ago    Multiple Pain Sites  Yes    Pain Score  8    Pain Location  Hip    Pain Orientation  Right;Left       TREATMENT   TE Bridges 2 x 10 - cues for foot placement and excursion for improved target muscle activation SLR supine - note quad lag B HS stretch with belt assist B x 30 sec Step ups 6" step forward 2 x 15 and lateral 1 x 15 - cues for increased step length Hurdles 20 ft R/L x 6 for increased step length and  LAQ x 15 L/R with cues for tempo for joint protection and improved strengthening of target muscle  TE for strengthening of hip stabilizers and extensors   NMR  Tandem walk 10 ft x 6 SLS holds 3 sec x 15 R/L   NMR for improved balance and control with functional movements     PT Education - 10/19/19 1224    Education provided  Yes    Education Details  form/technique with exercise; HEP: SLS, tandem walk, HS stretch    Person(s) Educated  Patient    Methods  Explanation;Demonstration;Tactile cues;Verbal cues;Handout    Comprehension  Verbalized understanding;Returned demonstration;Verbal cues required;Tactile cues required       PT Short Term Goals - 09/12/19 1419      PT SHORT TERM GOAL #1   Title  Patient will demonstrate complaince with HEP to speed recovery and reduce total number of visits.    Baseline  HEP given    Time  2    Period  Weeks    Status  New    Target Date  09/27/19        PT Long Term Goals - 09/14/19 1742      PT LONG TERM GOAL #1   Title  Patient will improve hip strength to 4/5 grossly to facilitate improved mobility with ADLs.    Baseline  3, 4-    Time  8    Period  Weeks    Status  New      PT LONG TERM GOAL  #2   Title  Patient will demonstrate walking tolerance increased to 30 minutes without increase in symptoms to demonstrate improved ability to perform walking program for cardiovascular health.    Baseline  10 minute tolerance (patient report)    Time  8    Period  Weeks    Status  New      PT LONG TERM  GOAL #3   Title  Patient will report worst pain of 3/10 during therapy to demonstrate reduced disability    Baseline  Worst 7/10    Time  8    Period  Weeks    Status  New      PT LONG TERM GOAL #4   Title  Patient will demonstrate ability to perform all bed mobility maneuvers without increase in pain for reduced disability with mobility and transfers with ADLs.    Baseline  Reports pain with bed mobility supine <> sidelying L/R, sidelying to sitting EOB    Time  8    Period  Weeks    Status  New      PT LONG TERM GOAL #5   Title  Patient will improve 10MW speed to 1.0 m/sec to meet norms for minimal fall risk and full community ambulation.    Baseline  0.88 m/sec    Time  8    Period  Weeks    Status  New    Target Date  11/09/19            Plan - 10/19/19 1005    Clinical Impression Statement  Pt reports of ankle and hip pain not consistent with gait which demonstrates no new deviations. Pain report consistently diminishes within session with moderate therex. Patient tolerates exercises well and demonstrates motivation to participate in therapy. Advanced HEP with SLS, HS strech, and tandem walk. Patient will benefit from additional physical therapy to improve hip strength and reduce pain with gait.    Personal Factors and Comorbidities  Age;Comorbidity 3+;Time since onset of injury/illness/exacerbation    Comorbidities  MCI, HTN, CA, cataracts    Examination-Activity Limitations  Bend;Sit;Squat;Stairs;Stand;Sleep    Examination-Participation Restrictions  Cleaning;Community Activity    Stability/Clinical Decision Making  Evolving/Moderate complexity    Rehab Potential   Fair    Clinical Impairments Affecting Rehab Potential  MCI    PT Frequency  2x / week   Eval and 1 f/u visit   PT Treatment/Interventions  ADLs/Self Care Home Management;Therapeutic exercise;Patient/family education;Neuromuscular re-education;Therapeutic activities;Functional mobility training;Balance training;Manual techniques;Gait training;Stair training;Moist Heat;Electrical Stimulation;Cryotherapy;Taping;Dry needling;Energy conservation;Passive range of motion;Joint Manipulations    PT Next Visit Plan  Progress hip therex; examine patellar N/T onset with walking    PT Home Exercise Plan  SKTC, bridges, hamstring stretch, SLR, walking 15 min, ankle pumps, seated marches    Consulted and Agree with Plan of Care  Patient       Patient will benefit from skilled therapeutic intervention in order to improve the following deficits and impairments:  Decreased range of motion, Postural dysfunction, Pain, Abnormal gait, Decreased activity tolerance, Decreased coordination, Decreased endurance, Decreased balance, Decreased mobility, Difficulty walking, Hypomobility, Increased muscle spasms, Impaired perceived functional ability, Decreased strength, Impaired flexibility  Visit Diagnosis: Pain in left hip  Pain in right hip  Difficulty in walking, not elsewhere classified     Problem List Patient Active Problem List   Diagnosis Date Noted  . Avitaminosis D 08/24/2019  . Insomnia due to mental condition 05/31/2019  . Bipolar I disorder, most recent episode depressed ([facility]) 05/31/2019  . Mild cognitive disorder 05/31/2019  . Alcohol use disorder, moderate, in sustained remission ([place]) 05/31/2019  . Elevated tumor markers 05/27/2019  . GAD (generalized anxiety disorder) 04/10/2019  . Altered mental status 04/10/2019  . MDD (major depressive disorder), severe ([place]) 01/31/2019  . High serum carbohydrate antigen 19-9 (CA19-9) 12/01/2018  . Adjustment disorder with anxious mood 11/24/2018  .  Essential hypertension 11/24/2018  . Screening for lipid disorders 11/24/2018  . B12 deficiency 10/17/2018  . Incisional hernia, without obstruction or gangrene 09/12/2018  . Genetic testing 04/07/2018  . Family history of breast cancer 03/24/2018  . Family history of lung cancer 03/24/2018  . Malignant neoplasm of upper-outer quadrant of right breast in female, estrogen receptor positive ([place] [facility]) 03/17/2018  . Malignant neoplasm of upper-inner quadrant of right breast in female, estrogen receptor positive ([place]) 03/17/2018  . History of alcohol dependence ([facility] [facility]) 02/07/2018  . History of psychosis 02/07/2018  . History of insomnia 02/07/2018    Virgia Land, SPT 10/19/2019, 12:25 PM  [facility] [address] [place], [address] Phone: [phone]   Fax:  [phone]  Name: Kendra [name] MRN: [id number] Date of Birth: Mar 05, 1946

## 2019-10-24 ENCOUNTER — Ambulatory Visit: Payer: Medicare Other

## 2019-10-24 ENCOUNTER — Other Ambulatory Visit: Payer: Self-pay

## 2019-10-24 DIAGNOSIS — M25552 Pain in left hip: Secondary | ICD-10-CM

## 2019-10-24 DIAGNOSIS — M25551 Pain in right hip: Secondary | ICD-10-CM

## 2019-10-24 DIAGNOSIS — R262 Difficulty in walking, not elsewhere classified: Secondary | ICD-10-CM

## 2019-10-24 NOTE — Therapy (Signed)
[facility] [address] [place], [address] Phone: [phone]   Fax:  [phone]  Physical Therapy Treatment/ Progress Note  Patient Details  Name: Kendra [name] MRN: [id number] Date of Birth: 06-09-1946 No data recorded  Reporting Period: 09/13/2019 - 10/24/2019  Encounter Date: 10/24/2019  PT End of Session - 10/24/19 1205    Visit Number  10    Number of Visits  17    Date for PT Re-Evaluation  11/07/19    Authorization Type  10 / 10    PT Start Time  1120    PT Stop Time  1205    PT Time Calculation (min)  45 min    Equipment Utilized During Treatment  Gait belt    Activity Tolerance  Patient tolerated treatment well;No increased pain    Behavior During Therapy  WFL for tasks assessed/performed       Past Medical History:  Diagnosis Date  . Anxiety   . Breast cancer ([facility]) 03/2018   right breast cancer at 11:00 and 1:00  . Bursitis    bilateral hips and knees  . Complication of anesthesia   . Depression   . Family history of breast cancer 03/24/2018  . Family history of lung cancer 03/24/2018  . History of alcohol dependence ([place] [facility]) 2007   no ETOH; resolved since 2007  . History of hiatal hernia   . History of psychosis 2014   due to sleep disturbance  . Hypertension   . Personal history of radiation therapy 06/2018-07/2018   right breast ca  . PONV (postoperative nausea and vomiting)     Past Surgical History:  Procedure Laterality Date  . BREAST BIOPSY Right 03/14/2018   11:00 DCIS and invasive ductal carcinoma  . BREAST BIOPSY Right 03/14/2018   1:00 Invasive ductal carcinoma  . BREAST LUMPECTOMY Right 04/27/2018   lumpectomy of 11 and 1:00 cancers, clear margins, negative LN  . BREAST LUMPECTOMY WITH RADIOACTIVE SEED AND SENTINEL LYMPH NODE BIOPSY Right 04/27/2018   Procedure: RIGHT BREAST LUMPECTOMY WITH RADIOACTIVE SEED X 2 AND RIGHT SENTINEL LYMPH NODE BIOPSY;  Surgeon:  [name], MD;  Location: [facility];  Service: General;  Laterality: Right;  . CATARACT EXTRACTION W/PHACO Left 08/30/2019   Procedure: CATARACT EXTRACTION PHACO AND INTRAOCULAR LENS PLACEMENT ([facility]) LEFT panoptix toric  01:20.5  12.7%  10.26;  Surgeon: Leandrew Koyanagi, MD;  Location: [facility];  Service: Ophthalmology;  Laterality: Left;  . CATARACT EXTRACTION W/PHACO Right 09/20/2019   Procedure: CATARACT EXTRACTION PHACO AND INTRAOCULAR LENS PLACEMENT ([facility]) RIGHT 5.71  00:36.4  15.7%;  Surgeon: Leandrew Koyanagi, MD;  Location: [facility];  Service: Ophthalmology;  Laterality: Right;  . LAPAROTOMY N/A 02/10/2018   Procedure: EXPLORATORY LAPAROTOMY;  Surgeon: Isabel Caprice, MD;  [facility];  Service: Gynecology;  Laterality: N/A;  . MASS EXCISION  01/2018   abdominal  . OVARIAN CYST REMOVAL    . SALPINGOOPHORECTOMY Bilateral 02/10/2018   Procedure: BILATERAL SALPINGO OOPHORECTOMY; PERITONEAL WASHINGS;  Surgeon: Isabel Caprice, MD;  [facility];  Service: Gynecology;  Laterality: Bilateral;  . TONSILLECTOMY    . TONSILLECTOMY AND ADENOIDECTOMY  1953    There were no vitals filed for this visit.  Subjective Assessment - 10/24/19 1120    Subjective  Patient reports that she is still in pain.    Pertinent History  Patient report unclear regarding onset. Visited MD on Sept 16 and received injection  with relief for approximately 1 month. Reports she cannot receive an additional injection pending eye surgery. Received dx from MD of hip bursitis in B hips. Reports pain with walking, stanindg, sitting 5/10 current 7/10 worst 3/10 best, with temporary relief when she is sitting "just right" and with naproxen. Agg: activity. Cormorbidities: hx of breast cancer, HTN, MCI, insomnia, cataracts    Limitations  Sitting;Standing;Walking;House hold activities    How long can you sit comfortably?  Pain constant, reduced with neutral pelvic tilt     How long can you stand comfortably?  Pain constant, increases with time    How long can you walk comfortably?  Pain constant, increases with time    Patient Stated Goals  Be able to move without hurting, walk 1 hour    Currently in Pain?  Yes    Pain Score  9     Pain Location  Hip    Pain Orientation  Right;Left    Pain Onset  More than a month ago       TREATMENT    TE LTRs over ball x 20 R/L Hamstring curls/DKTC over ball x 20 Bridges x 20 - cues for foot placement and excursion for improved target muscle activation   6 min walk: 1195 Pt reports pain in R hip  Step-ups 8" step R/L x 10 Seated marches x 10 R/L Standing hip abd x 10 R/L  NMR SLS holds 3 sec x 15 R/L  PT Education - 10/24/19 1131    Education provided  Yes    Education Details  form/technique with exercise    Person(s) Educated  Patient    Methods  Explanation;Demonstration;Tactile cues;Verbal cues    Comprehension  Verbalized understanding;Returned demonstration;Verbal cues required;Tactile cues required       PT Short Term Goals - 09/12/19 1419      PT SHORT TERM GOAL #1   Title  Patient will demonstrate complaince with HEP to speed recovery and reduce total number of visits.    Baseline  HEP given    Time  2    Period  Weeks    Status  New    Target Date  09/27/19        PT Long Term Goals - 10/24/19 1215      PT LONG TERM GOAL #1   Title  Patient will improve hip strength to 4/5 grossly to facilitate improved mobility with ADLs.    Baseline  3, 4-; 12/22 deferred to [place]    Time  8    Period  Weeks    Status  On-going      PT LONG TERM GOAL #2   Title  Patient will demonstrate walking tolerance increased to 30 minutes without increase in symptoms to demonstrate improved ability to perform walking program for cardiovascular health.    Baseline  10 minute tolerance (patient report); 12/22 10 minutes (per pt report)    Time  8    Period  Weeks    Status  On-going      PT LONG TERM  GOAL #3   Title  Patient will report worst pain of 3/10 during therapy to demonstrate reduced disability    Baseline  Worst 7/10; 12/22 worst 9/10    Time  8    Period  Weeks    Status  On-going      PT LONG TERM GOAL #4   Title  Patient will demonstrate ability to perform all bed mobility maneuvers without increase in  pain for reduced disability with mobility and transfers with ADLs.    Baseline  Reports pain with bed mobility supine <> sidelying L/R, sidelying to sitting EOB; 12/22 pt able to perform I without increased pain    Time  8    Period  Weeks    Status  Achieved      PT LONG TERM GOAL #5   Title  Patient will improve 10MW speed to 1.0 m/sec to meet norms for minimal fall risk and full community ambulation.    Baseline  0.88 m/sec; 12/22 0.96 m/sec    Time  8    Period  Weeks    Status  On-going      Additional Long Term Goals   Additional Long Term Goals  Yes      PT LONG TERM GOAL #6   Title  Patient will increase 6MWT by 200 ft to demonstrate improvement in functional capacity for transition to cardiac walking program    Baseline  6MWT = 1195    Time  8    Period  Weeks    Status  New    Target Date  12/19/19            Plan - 10/24/19 1240    Clinical Impression Statement  Reassessment: patient demonstrates gains in gait speed and in transfers. Pain reports remain inconsistent possibly influenced by hx of MCI. Pt able to tolerate 6 min walk and therapeutic exercise in standing with mild DOE; note consistency in speed throughout 6MWT. Plan to advance HEP with handout for pt to increase self-efficacy and reduce contribution of nonorganic aspects to her chronic pain. Patient will benefit form skilled physical therapy to improve hip strength and reduce pain with gait.    Personal Factors and Comorbidities  Age;Comorbidity 3+;Time since onset of injury/illness/exacerbation    Comorbidities  MCI, HTN, CA, cataracts    Examination-Activity Limitations   Bend;Sit;Squat;Stairs;Stand;Sleep    Examination-Participation Restrictions  Cleaning;Community Activity    Stability/Clinical Decision Making  Evolving/Moderate complexity    Rehab Potential  Fair    Clinical Impairments Affecting Rehab Potential  MCI    PT Frequency  2x / week   Eval and 1 f/u visit   PT Treatment/Interventions  ADLs/Self Care Home Management;Therapeutic exercise;Patient/family education;Neuromuscular re-education;Therapeutic activities;Functional mobility training;Balance training;Manual techniques;Gait training;Stair training;Moist Heat;Electrical Stimulation;Cryotherapy;Taping;Dry needling;Energy conservation;Passive range of motion;Joint Manipulations    PT Next Visit Plan  Progress hip therex; examine patellar N/T onset with walking    PT Home Exercise Plan  SKTC, bridges, hamstring stretch, SLR, walking 15 min, ankle pumps, seated marches    Consulted and Agree with Plan of Care  Patient       Patient will benefit from skilled therapeutic intervention in order to improve the following deficits and impairments:  Decreased range of motion, Postural dysfunction, Pain, Abnormal gait, Decreased activity tolerance, Decreased coordination, Decreased endurance, Decreased balance, Decreased mobility, Difficulty walking, Hypomobility, Increased muscle spasms, Impaired perceived functional ability, Decreased strength, Impaired flexibility  Visit Diagnosis: Pain in left hip  Pain in right hip  Difficulty in walking, not elsewhere classified     Problem List Patient Active Problem List   Diagnosis Date Noted  . Avitaminosis D 08/24/2019  . Insomnia due to mental condition 05/31/2019  . Bipolar I disorder, most recent episode depressed ([place]) 05/31/2019  . Mild cognitive disorder 05/31/2019  . Alcohol use disorder, moderate, in sustained remission ([place]) 05/31/2019  . Elevated tumor markers 05/27/2019  . GAD (generalized anxiety disorder)  04/10/2019  . Altered mental  status 04/10/2019  . MDD (major depressive disorder), severe ([facility]) 01/31/2019  . High serum carbohydrate antigen 19-9 (CA19-9) 12/01/2018  . Adjustment disorder with anxious mood 11/24/2018  . Essential hypertension 11/24/2018  . Screening for lipid disorders 11/24/2018  . B12 deficiency 10/17/2018  . Incisional hernia, without obstruction or gangrene 09/12/2018  . Genetic testing 04/07/2018  . Family history of breast cancer 03/24/2018  . Family history of lung cancer 03/24/2018  . Malignant neoplasm of upper-outer quadrant of right breast in female, estrogen receptor positive ([place]) 03/17/2018  . Malignant neoplasm of upper-inner quadrant of right breast in female, estrogen receptor positive ([place]) 03/17/2018  . History of alcohol dependence ([place]) 02/07/2018  . History of psychosis 02/07/2018  . History of insomnia 02/07/2018    Virgia Land, SPT 10/24/2019, 12:44 PM  [facility] [address], [place], [address] Phone: [phone]   Fax:  [phone]  Name: Kendra [name] MRN: [id number] Date of Birth: 06-22-1946

## 2019-10-31 ENCOUNTER — Ambulatory Visit: Payer: Medicare Other

## 2019-10-31 ENCOUNTER — Other Ambulatory Visit: Payer: Self-pay

## 2019-10-31 DIAGNOSIS — R262 Difficulty in walking, not elsewhere classified: Secondary | ICD-10-CM

## 2019-10-31 DIAGNOSIS — M25551 Pain in right hip: Secondary | ICD-10-CM

## 2019-10-31 DIAGNOSIS — M25552 Pain in left hip: Secondary | ICD-10-CM

## 2019-10-31 NOTE — Therapy (Signed)
[facility] [address], [place], [address] Phone: [phone]   Fax:  [phone]  Physical Therapy Treatment  Patient Details  Name: Kendra [name] MRN: [id number] Date of Birth: [birth year] No data recorded  Encounter Date: 10/31/2019  PT End of Session - 10/31/19 1423    Visit Number  11    Number of Visits  17    Date for PT Re-Evaluation  11/07/19    Authorization Type  1 / 10    PT Start Time  1302    PT Stop Time  1345    PT Time Calculation (min)  43 min    Equipment Utilized During Treatment  Gait belt    Activity Tolerance  Patient tolerated treatment well;No increased pain    Behavior During Therapy  WFL for tasks assessed/performed       Past Medical History:  Diagnosis Date  . Anxiety   . Breast cancer ([place]) 03/2018   right breast cancer at 11:00 and 1:00  . Bursitis    bilateral hips and knees  . Complication of anesthesia   . Depression   . Family history of breast cancer 03/24/2018  . Family history of lung cancer 03/24/2018  . History of alcohol dependence ([place]) 2007   no ETOH; resolved since 2007  . History of hiatal hernia   . History of psychosis 2014   due to sleep disturbance  . Hypertension   . Personal history of radiation therapy 06/2018-07/2018   right breast ca  . PONV (postoperative nausea and vomiting)     Past Surgical History:  Procedure Laterality Date  . BREAST BIOPSY Right 03/14/2018   11:00 DCIS and invasive ductal carcinoma  . BREAST BIOPSY Right 03/14/2018   1:00 Invasive ductal carcinoma  . BREAST LUMPECTOMY Right 04/27/2018   lumpectomy of 11 and 1:00 cancers, clear margins, negative LN  . BREAST LUMPECTOMY WITH RADIOACTIVE SEED AND SENTINEL LYMPH NODE BIOPSY Right 04/27/2018   Procedure: RIGHT BREAST LUMPECTOMY WITH RADIOACTIVE SEED X 2 AND RIGHT SENTINEL LYMPH NODE BIOPSY;  Surgeon: [name], MD;  Location: [facility];   Service: General;  Laterality: Right;  . CATARACT EXTRACTION W/PHACO Left 08/30/2019   Procedure: CATARACT EXTRACTION PHACO AND INTRAOCULAR LENS PLACEMENT ([place]) LEFT panoptix toric  01:20.5  12.7%  10.26;  Surgeon: Leandrew Koyanagi, MD;  Location: [facility];  Service: Ophthalmology;  Laterality: Left;  . CATARACT EXTRACTION W/PHACO Right 09/20/2019   Procedure: CATARACT EXTRACTION PHACO AND INTRAOCULAR LENS PLACEMENT ([facility]) RIGHT 5.71  00:36.4  15.7%;  Surgeon: Leandrew Koyanagi, MD;  Location: [facility];  Service: Ophthalmology;  Laterality: Right;  . LAPAROTOMY N/A 02/10/2018   Procedure: EXPLORATORY LAPAROTOMY;  Surgeon: Isabel Caprice, MD;  Location: [facility];  Service: Gynecology;  Laterality: N/A;  . MASS EXCISION  01/2018   abdominal  . OVARIAN CYST REMOVAL    . SALPINGOOPHORECTOMY Bilateral 02/10/2018   Procedure: BILATERAL SALPINGO OOPHORECTOMY; PERITONEAL WASHINGS;  Surgeon: Isabel Caprice, MD;  Location: [facility];  Service: Gynecology;  Laterality: Bilateral;  . TONSILLECTOMY    . TONSILLECTOMY AND ADENOIDECTOMY  1953    There were no vitals filed for this visit.  Subjective Assessment - 10/31/19 1304    Subjective  Patient reports no change in condition. Reports that it hurts with standing, walking, stepping down.    Pertinent History  Patient report unclear regarding onset. Visited MD on Sept 16 and received injection with  relief for approximately 1 month. Reports she cannot receive an additional injection pending eye surgery. Received dx from MD of hip bursitis in B hips. Reports pain with walking, stanindg, sitting 5/10 current 7/10 worst 3/10 best, with temporary relief when she is sitting "just right" and with naproxen. Agg: activity. Cormorbidities: hx of breast cancer, HTN, MCI, insomnia, cataracts    Limitations  Sitting;Standing;Walking;House hold activities    How long can you sit comfortably?  Pain constant, reduced with neutral pelvic tilt    How  long can you stand comfortably?  Pain constant, increases with time    How long can you walk comfortably?  Pain constant, increases with time    Patient Stated Goals  Be able to move without hurting, walk 1 hour    Currently in Pain?  Yes    Pain Score  9     Pain Location  Hip    Pain Orientation  Right;Left    Pain Onset  More than a month ago       TREATMENT  Observation: Increased tissue tension noted with TTP 1+ in L TFL, R/L quads, adductors, proximal hamstring, gluteals  MT Myofascial release, trigger point release, and soft tissue mobilization to R rectus femoris, glute med, hamstrings, adductors; L glute min, vastus lateralis, rectus femoris, adductors, TFL x 30 min Passive SKTC x 20 R/L HS stretch x 30 sec R/L  Pt reports no pain on R and reduced pain on L following manual therapy.  TE LTRs x 20 R/L Bridges 2 x 10 - cues for foot placement and technique for increased gluteal activation Hamstring curls over ball x 20  TE for gentle mobilization and targeted gluteal strengthening  Pt provided with handout and instructions for HEP.   PT Education - 10/31/19 1422    Education provided  Yes    Education Details  form/technique with exercise; HEP reviewed    Person(s) Educated  Patient    Methods  Explanation;Demonstration;Tactile cues;Verbal cues    Comprehension  Verbalized understanding;Returned demonstration;Verbal cues required;Tactile cues required       PT Short Term Goals - 09/12/19 1419      PT SHORT TERM GOAL #1   Title  Patient will demonstrate complaince with HEP to speed recovery and reduce total number of visits.    Baseline  HEP given    Time  2    Period  Weeks    Status  New    Target Date  09/27/19        PT Long Term Goals - 10/24/19 1215      PT LONG TERM GOAL #1   Title  Patient will improve hip strength to 4/5 grossly to facilitate improved mobility with ADLs.    Baseline  3, 4-; 12/22 deferred to NV    Time  8    Period  Weeks     Status  On-going      PT LONG TERM GOAL #2   Title  Patient will demonstrate walking tolerance increased to 30 minutes without increase in symptoms to demonstrate improved ability to perform walking program for cardiovascular health.    Baseline  10 minute tolerance (patient report); 12/22 10 minutes (per pt report)    Time  8    Period  Weeks    Status  On-going      PT LONG TERM GOAL #3   Title  Patient will report worst pain of 3/10 during therapy to demonstrate reduced disability  Baseline  Worst 7/10; 12/22 worst 9/10    Time  8    Period  Weeks    Status  On-going      PT LONG TERM GOAL #4   Title  Patient will demonstrate ability to perform all bed mobility maneuvers without increase in pain for reduced disability with mobility and transfers with ADLs.    Baseline  Reports pain with bed mobility supine <> sidelying L/R, sidelying to sitting EOB; 12/22 pt able to perform I without increased pain    Time  8    Period  Weeks    Status  Achieved      PT LONG TERM GOAL #5   Title  Patient will improve 10MW speed to 1.0 m/sec to meet norms for minimal fall risk and full community ambulation.    Baseline  0.88 m/sec; 12/22 0.96 m/sec    Time  8    Period  Weeks    Status  On-going      Additional Long Term Goals   Additional Long Term Goals  Yes      PT LONG TERM GOAL #6   Title  Patient will increase 6MWT by 200 ft to demonstrate improvement in functional capacity for transition to cardiac walking program    Baseline  6MWT = 1195    Time  8    Period  Weeks    Status  New    Target Date  12/19/19            Plan - 10/31/19 1425    Clinical Impression Statement  Patient demonstrates good within-session pain reduction but carryover remains challenging; reviewed HEP with "easy" and "hard" plans to accommodate relative rest as required and improve sense of pain control. Note increased tissue tension broadly throughout bilateral hip musculature particularly along  hamstrings, improved with change to bridging technique and manual therapy. Plan to regress intensity of exercises with focus on gluteal strengthening per tissue tolerance. Patient will benefit from skilled physical therapy to improve hip strength and reduce pain with gait.    Personal Factors and Comorbidities  Age;Comorbidity 3+;Time since onset of injury/illness/exacerbation    Comorbidities  MCI, HTN, CA, cataracts    Examination-Activity Limitations  Bend;Sit;Squat;Stairs;Stand;Sleep    Examination-Participation Restrictions  Cleaning;Community Activity    Stability/Clinical Decision Making  Evolving/Moderate complexity    Rehab Potential  Fair    Clinical Impairments Affecting Rehab Potential  MCI    PT Frequency  2x / week   Eval and 1 f/u visit   PT Treatment/Interventions  ADLs/Self Care Home Management;Therapeutic exercise;Patient/family education;Neuromuscular re-education;Therapeutic activities;Functional mobility training;Balance training;Manual techniques;Gait training;Stair training;Moist Heat;Electrical Stimulation;Cryotherapy;Taping;Dry needling;Energy conservation;Passive range of motion;Joint Manipulations    PT Next Visit Plan  Progress hip therex; examine patellar N/T onset with walking    PT Home Exercise Plan  SKTC, bridges, hamstring stretch, SLR, walking 15 min, ankle pumps, seated marches    Consulted and Agree with Plan of Care  Patient       Patient will benefit from skilled therapeutic intervention in order to improve the following deficits and impairments:  Decreased range of motion, Postural dysfunction, Pain, Abnormal gait, Decreased activity tolerance, Decreased coordination, Decreased endurance, Decreased balance, Decreased mobility, Difficulty walking, Hypomobility, Increased muscle spasms, Impaired perceived functional ability, Decreased strength, Impaired flexibility  Visit Diagnosis: Pain in left hip  Pain in right hip  Difficulty in walking, not elsewhere  classified     Problem List Patient Active Problem List   Diagnosis  Date Noted  . Avitaminosis D 08/24/2019  . Insomnia due to mental condition 05/31/2019  . Bipolar I disorder, most recent episode depressed ([place]) 05/31/2019  . Mild cognitive disorder 05/31/2019  . Alcohol use disorder, moderate, in sustained remission ([place]) 05/31/2019  . Elevated tumor markers 05/27/2019  . GAD (generalized anxiety disorder) 04/10/2019  . Altered mental status 04/10/2019  . MDD (major depressive disorder), severe ([place]) 01/31/2019  . High serum carbohydrate antigen 19-9 (CA19-9) 12/01/2018  . Adjustment disorder with anxious mood 11/24/2018  . Essential hypertension 11/24/2018  . Screening for lipid disorders 11/24/2018  . B12 deficiency 10/17/2018  . Incisional hernia, without obstruction or gangrene 09/12/2018  . Genetic testing 04/07/2018  . Family history of breast cancer 03/24/2018  . Family history of lung cancer 03/24/2018  . Malignant neoplasm of upper-outer quadrant of right breast in female, estrogen receptor positive ([place]) 03/17/2018  . Malignant neoplasm of upper-inner quadrant of right breast in female, estrogen receptor positive ([place]) 03/17/2018  . History of alcohol dependence ([place]) 02/07/2018  . History of psychosis 02/07/2018  . History of insomnia 02/07/2018    Virgia Land, SPT 10/31/2019, 2:32 PM  [facility] [address] [place], [address] Phone: [phone]   Fax:  [phone]  Name: Kendra [name] MRN: [id number] Date of Birth: 06-Mar-1946

## 2019-11-02 ENCOUNTER — Other Ambulatory Visit: Payer: Self-pay

## 2019-11-02 ENCOUNTER — Ambulatory Visit: Payer: Medicare Other

## 2019-11-02 DIAGNOSIS — M25551 Pain in right hip: Secondary | ICD-10-CM

## 2019-11-02 DIAGNOSIS — R262 Difficulty in walking, not elsewhere classified: Secondary | ICD-10-CM

## 2019-11-02 DIAGNOSIS — M25552 Pain in left hip: Secondary | ICD-10-CM | POA: Diagnosis not present

## 2019-11-02 NOTE — Therapy (Signed)
[facility] [address], [place], [address] Phone: [phone]   Fax:  [phone]  Physical Therapy Treatment  Patient Details  Name: Kendra [name] MRN: [id number] Date of Birth: 1946/09/16 No data recorded  Encounter Date: 11/02/2019  PT End of Session - 11/02/19 1227    Visit Number  12    Number of Visits  17    Date for PT Re-Evaluation  11/07/19    Authorization Type  2 / 10    PT Start Time  1120    PT Stop Time  1200    PT Time Calculation (min)  40 min    Activity Tolerance  Patient tolerated treatment well;No increased pain    Behavior During Therapy  WFL for tasks assessed/performed       Past Medical History:  Diagnosis Date  . Anxiety   . Breast cancer ([facility]) 03/2018   right breast cancer at 11:00 and 1:00  . Bursitis    bilateral hips and knees  . Complication of anesthesia   . Depression   . Family history of breast cancer 03/24/2018  . Family history of lung cancer 03/24/2018  . History of alcohol dependence ([place]) 2007   no ETOH; resolved since 2007  . History of hiatal hernia   . History of psychosis 2014   due to sleep disturbance  . Hypertension   . Personal history of radiation therapy 06/2018-07/2018   right breast ca  . PONV (postoperative nausea and vomiting)     Past Surgical History:  Procedure Laterality Date  . BREAST BIOPSY Right 03/14/2018   11:00 DCIS and invasive ductal carcinoma  . BREAST BIOPSY Right 03/14/2018   1:00 Invasive ductal carcinoma  . BREAST LUMPECTOMY Right 04/27/2018   lumpectomy of 11 and 1:00 cancers, clear margins, negative LN  . BREAST LUMPECTOMY WITH RADIOACTIVE SEED AND SENTINEL LYMPH NODE BIOPSY Right 04/27/2018   Procedure: RIGHT BREAST LUMPECTOMY WITH RADIOACTIVE SEED X 2 AND RIGHT SENTINEL LYMPH NODE BIOPSY;  Surgeon: [name], MD;  Location: [facility];  Service: General;  Laterality: Right;  . CATARACT  EXTRACTION W/PHACO Left 08/30/2019   Procedure: CATARACT EXTRACTION PHACO AND INTRAOCULAR LENS PLACEMENT ([place]) LEFT panoptix toric  01:20.5  12.7%  10.26;  Surgeon: Leandrew Koyanagi, MD;  Location: [facility];  Service: Ophthalmology;  Laterality: Left;  . CATARACT EXTRACTION W/PHACO Right 09/20/2019   Procedure: CATARACT EXTRACTION PHACO AND INTRAOCULAR LENS PLACEMENT ([facility]) RIGHT 5.71  00:36.4  15.7%;  Surgeon: Leandrew Koyanagi, MD;  Location: [facility];  Service: Ophthalmology;  Laterality: Right;  . LAPAROTOMY N/A 02/10/2018   Procedure: EXPLORATORY LAPAROTOMY;  Surgeon: Isabel Caprice, MD;  [facility];  Service: Gynecology;  Laterality: N/A;  . MASS EXCISION  01/2018   abdominal  . OVARIAN CYST REMOVAL    . SALPINGOOPHORECTOMY Bilateral 02/10/2018   Procedure: BILATERAL SALPINGO OOPHORECTOMY; PERITONEAL WASHINGS;  Surgeon: Isabel Caprice, MD;  [facility];  Service: Gynecology;  Laterality: Bilateral;  . TONSILLECTOMY    . TONSILLECTOMY AND ADENOIDECTOMY  1953    There were no vitals filed for this visit.  Subjective Assessment - 11/02/19 1121    Subjective  Patient reports that she has been having to more slowly this morning due to pain.    Pertinent History  Patient report unclear regarding onset. Visited MD on Sept 16 and received injection with relief for approximately 1 month. Reports she cannot receive an  additional injection pending eye surgery. Received dx from MD of hip bursitis in B hips. Reports pain with walking, stanindg, sitting 5/10 current 7/10 worst 3/10 best, with temporary relief when she is sitting "just right" and with naproxen. Agg: activity. Cormorbidities: hx of breast cancer, HTN, MCI, insomnia, cataracts    Limitations  Sitting;Standing;Walking;House hold activities    How long can you sit comfortably?  Pain constant, reduced with neutral pelvic tilt    How long can you stand comfortably?  Pain constant, increases with time     How long can you walk comfortably?  Pain constant, increases with time    Patient Stated Goals  Be able to move without hurting, walk 1 hour    Currently in Pain?  Yes    Pain Score  9     Pain Location  Hip    Pain Orientation  Left;Right    Pain Descriptors / Indicators  Sore    Pain Type  Chronic pain    Pain Onset  More than a month ago        TREATMENT  MT Myofascial release, trigger point release, and soft tissue mobilization to R rectus femoris, glutes, hamstrings, adductors; L glutes, quads, adductors, TFL x 30 min Passive SKTC x 10 R/L  MT for pain control and gentle mobilization of tissue   TE Bridges x 20 Hooklying abd green theraband with 3 sec hold BLE x 20, RLE x 10, LLE x10 Red ball hamstring curls x 20 BLE - pt c/o irritation at L HS insertion Hooklying leg fall-outs x 10 R/L - note poor movement quality in L SLR supine x 6 R/L - cues to maintain LE straight for improved quad activation STS green theraband around knees x 5 Education regarding self-care: use of heat and STM to reduce pain and promote tissue recovery   TE for gentle strengthening and active mobilization  PT Education - 11/02/19 1122    Education provided  Yes    Education Details  form/technique with exercise    Person(s) Educated  Patient    Methods  Explanation;Demonstration;Tactile cues;Verbal cues    Comprehension  Verbalized understanding;Returned demonstration;Verbal cues required;Tactile cues required       PT Short Term Goals - 09/12/19 1419      PT SHORT TERM GOAL #1   Title  Patient will demonstrate complaince with HEP to speed recovery and reduce total number of visits.    Baseline  HEP given    Time  2    Period  Weeks    Status  New    Target Date  09/27/19        PT Long Term Goals - 10/24/19 1215      PT LONG TERM GOAL #1   Title  Patient will improve hip strength to 4/5 grossly to facilitate improved mobility with ADLs.    Baseline  3, 4-; 12/22 deferred to NV     Time  8    Period  Weeks    Status  On-going      PT LONG TERM GOAL #2   Title  Patient will demonstrate walking tolerance increased to 30 minutes without increase in symptoms to demonstrate improved ability to perform walking program for cardiovascular health.    Baseline  10 minute tolerance (patient report); 12/22 10 minutes (per pt report)    Time  8    Period  Weeks    Status  On-going      PT LONG TERM  GOAL #3   Title  Patient will report worst pain of 3/10 during therapy to demonstrate reduced disability    Baseline  Worst 7/10; 12/22 worst 9/10    Time  8    Period  Weeks    Status  On-going      PT LONG TERM GOAL #4   Title  Patient will demonstrate ability to perform all bed mobility maneuvers without increase in pain for reduced disability with mobility and transfers with ADLs.    Baseline  Reports pain with bed mobility supine <> sidelying L/R, sidelying to sitting EOB; 12/22 pt able to perform I without increased pain    Time  8    Period  Weeks    Status  Achieved      PT LONG TERM GOAL #5   Title  Patient will improve 10MW speed to 1.0 m/sec to meet norms for minimal fall risk and full community ambulation.    Baseline  0.88 m/sec; 12/22 0.96 m/sec    Time  8    Period  Weeks    Status  On-going      Additional Long Term Goals   Additional Long Term Goals  Yes      PT LONG TERM GOAL #6   Title  Patient will increase 6MWT by 200 ft to demonstrate improvement in functional capacity for transition to cardiac walking program    Baseline  6MWT = 1195    Time  8    Period  Weeks    Status  New    Target Date  12/19/19            Plan - 11/02/19 1227    Clinical Impression Statement  Increased manual therapy duration today with decreased intensity of therapeutic exercise for pain control. Pt reports pain on deep palpation broadly through hip musculature and increased tissue tension is present diffusely L>R. Patient reports reduction in pain following  manual therapy and light exercise but session is notable for continuing c/o pain at HS insertion and with hamstrings exercises including sit to stand. Patient advised to reduce intensity of exercise at home to allow for tissue recovery. Patient will benefit from skilld physical therapy to improve hip strength and reduce pain with gait.    Personal Factors and Comorbidities  Age;Comorbidity 3+;Time since onset of injury/illness/exacerbation    Comorbidities  MCI, HTN, CA, cataracts    Examination-Activity Limitations  Bend;Sit;Squat;Stairs;Stand;Sleep    Examination-Participation Restrictions  Cleaning;Community Activity    Stability/Clinical Decision Making  Evolving/Moderate complexity    Rehab Potential  Fair    Clinical Impairments Affecting Rehab Potential  MCI    PT Frequency  2x / week   Eval and 1 f/u visit   PT Treatment/Interventions  ADLs/Self Care Home Management;Therapeutic exercise;Patient/family education;Neuromuscular re-education;Therapeutic activities;Functional mobility training;Balance training;Manual techniques;Gait training;Stair training;Moist Heat;Electrical Stimulation;Cryotherapy;Taping;Dry needling;Energy conservation;Passive range of motion;Joint Manipulations    PT Next Visit Plan  Continue with light intensity LE strengthening/manual therapy    PT Home Exercise Plan  SKTC, bridges, hamstring stretch, SLR, walking 15 min, ankle pumps, seated marches    Consulted and Agree with Plan of Care  Patient       Patient will benefit from skilled therapeutic intervention in order to improve the following deficits and impairments:  Decreased range of motion, Postural dysfunction, Pain, Abnormal gait, Decreased activity tolerance, Decreased coordination, Decreased endurance, Decreased balance, Decreased mobility, Difficulty walking, Hypomobility, Increased muscle spasms, Impaired perceived functional ability, Decreased strength, Impaired flexibility  Visit Diagnosis: Pain in left  hip  Pain in right hip  Difficulty in walking, not elsewhere classified     Problem List Patient Active Problem List   Diagnosis Date Noted  . Avitaminosis D 08/24/2019  . Insomnia due to mental condition 05/31/2019  . Bipolar I disorder, most recent episode depressed ([place]) 05/31/2019  . Mild cognitive disorder 05/31/2019  . Alcohol use disorder, moderate, in sustained remission ([place]) 05/31/2019  . Elevated tumor markers 05/27/2019  . GAD (generalized anxiety disorder) 04/10/2019  . Altered mental status 04/10/2019  . MDD (major depressive disorder), severe ([place]) 01/31/2019  . High serum carbohydrate antigen 19-9 (CA19-9) 12/01/2018  . Adjustment disorder with anxious mood 11/24/2018  . Essential hypertension 11/24/2018  . Screening for lipid disorders 11/24/2018  . B12 deficiency 10/17/2018  . Incisional hernia, without obstruction or gangrene 09/12/2018  . Genetic testing 04/07/2018  . Family history of breast cancer 03/24/2018  . Family history of lung cancer 03/24/2018  . Malignant neoplasm of upper-outer quadrant of right breast in female, estrogen receptor positive ([place]) 03/17/2018  . Malignant neoplasm of upper-inner quadrant of right breast in female, estrogen receptor positive ([place]) 03/17/2018  . History of alcohol dependence ([place]) 02/07/2018  . History of psychosis 02/07/2018  . History of insomnia 02/07/2018    Virgia Land, Jearld Pies 11/02/2019, 12:39 PM  [facility] [address] [place], [address] Phone: [phone]   Fax:  [phone]  Name: Klaudia Shiplett MRN: [id number] Date of Birth: [birth year]

## 2019-11-06 ENCOUNTER — Ambulatory Visit: Payer: Medicare PPO | Attending: Family Medicine

## 2019-11-06 ENCOUNTER — Other Ambulatory Visit: Payer: Self-pay

## 2019-11-06 DIAGNOSIS — R262 Difficulty in walking, not elsewhere classified: Secondary | ICD-10-CM | POA: Diagnosis not present

## 2019-11-06 DIAGNOSIS — M25552 Pain in left hip: Secondary | ICD-10-CM

## 2019-11-06 DIAGNOSIS — M25551 Pain in right hip: Secondary | ICD-10-CM | POA: Diagnosis not present

## 2019-11-06 NOTE — Therapy (Signed)
[facility] [address] [place], [address] Phone: [phone]   Fax:  [phone]  Physical Therapy Treatment  Patient Details  Name: Kendra [name] MRN: [id number] Date of Birth: [birth year] No data recorded  Encounter Date: 11/06/2019  PT End of Session - 11/06/19 1337    Visit Number  13    Number of Visits  17    Date for PT Re-Evaluation  11/07/19    Authorization Type  3 / 10    PT Start Time  [id number]    PT Stop Time  1345    PT Time Calculation (min)  40 min    Activity Tolerance  Patient tolerated treatment well;No increased pain    Behavior During Therapy  WFL for tasks assessed/performed       Past Medical History:  Diagnosis Date  . Anxiety   . Breast cancer ([place]) 03/2018   right breast cancer at 11:00 and 1:00  . Bursitis    bilateral hips and knees  . Complication of anesthesia   . Depression   . Family history of breast cancer 03/24/2018  . Family history of lung cancer 03/24/2018  . History of alcohol dependence ([place]) 2007   no ETOH; resolved since 2007  . History of hiatal hernia   . History of psychosis 2014   due to sleep disturbance  . Hypertension   . Personal history of radiation therapy 06/2018-07/2018   right breast ca  . PONV (postoperative nausea and vomiting)     Past Surgical History:  Procedure Laterality Date  . BREAST BIOPSY Right 03/14/2018   11:00 DCIS and invasive ductal carcinoma  . BREAST BIOPSY Right 03/14/2018   1:00 Invasive ductal carcinoma  . BREAST LUMPECTOMY Right 04/27/2018   lumpectomy of 11 and 1:00 cancers, clear margins, negative LN  . BREAST LUMPECTOMY WITH RADIOACTIVE SEED AND SENTINEL LYMPH NODE BIOPSY Right 04/27/2018   Procedure: RIGHT BREAST LUMPECTOMY WITH RADIOACTIVE SEED X 2 AND RIGHT SENTINEL LYMPH NODE BIOPSY;  Surgeon: [name], MD;  Location: [facility];  Service: General;  Laterality: Right;  . CATARACT  EXTRACTION W/PHACO Left 08/30/2019   Procedure: CATARACT EXTRACTION PHACO AND INTRAOCULAR LENS PLACEMENT ([place]) LEFT panoptix toric  01:20.5  12.7%  10.26;  Surgeon: Leandrew Koyanagi, MD;  Location: [facility];  Service: Ophthalmology;  Laterality: Left;  . CATARACT EXTRACTION W/PHACO Right 09/20/2019   Procedure: CATARACT EXTRACTION PHACO AND INTRAOCULAR LENS PLACEMENT ([facility]) RIGHT 5.71  00:36.4  15.7%;  Surgeon: Leandrew Koyanagi, MD;  Location: [facility];  Service: Ophthalmology;  Laterality: Right;  . LAPAROTOMY N/A 02/10/2018   Procedure: EXPLORATORY LAPAROTOMY;  Surgeon: Isabel Caprice, MD;  Location: [facility];  Service: Gynecology;  Laterality: N/A;  . MASS EXCISION  01/2018   abdominal  . OVARIAN CYST REMOVAL    . SALPINGOOPHORECTOMY Bilateral 02/10/2018   Procedure: BILATERAL SALPINGO OOPHORECTOMY; PERITONEAL WASHINGS;  Surgeon: Isabel Caprice, MD;  Location: [facility];  Service: Gynecology;  Laterality: Bilateral;  . TONSILLECTOMY    . TONSILLECTOMY AND ADENOIDECTOMY  1952    There were no vitals filed for this visit.  Subjective Assessment - 11/06/19 1317    Subjective  Patient states she is having pain around her L hip and not the R hip. Patient states she is able to walk for 7 min before increase in pain.    Pertinent History  Patient report unclear regarding onset. Visited MD on Sept 16  and received injection with relief for approximately 1 month. Reports she cannot receive an additional injection pending eye surgery. Received dx from MD of hip bursitis in B hips. Reports pain with walking, stanindg, sitting 5/10 current 7/10 worst 3/10 best, with temporary relief when she is sitting "just right" and with naproxen. Agg: activity. Cormorbidities: hx of breast cancer, HTN, MCI, insomnia, cataracts    Limitations  Sitting;Standing;Walking;House hold activities    How long can you sit comfortably?  Pain constant, reduced with neutral pelvic tilt    How long can  you stand comfortably?  Pain constant, increases with time    How long can you walk comfortably?  Pain constant, increases with time    Patient Stated Goals  Be able to move without hurting, walk 1 hour    Currently in Pain?  Yes    Pain Score  8     Pain Location  Hip    Pain Orientation  Right;Left    Pain Descriptors / Indicators  Sore    Pain Type  Chronic pain    Pain Onset  More than a month ago    Pain Frequency  Constant               TREATMENT Therapeutic Exercise SLS single leg rotations in standing - 2 x 10 B Hip abduction in standing - x 15 Sit to stands with external rotation with YTB - x 20  Seated clamshells with RTB - x 20 Sit to stand with external cueing for hip abductor activation - x 20  Exercises performed to strengthen the glute med and improve pain and spasms. Manual therapy STM performed to lateral hip musculature mostly along the glute med to decrease pain and spasms with patient positioned in sitting  -- 8 min Self-mobilization with tennis ball against wall - 2 min      PT Education - 11/06/19 1335    Education provided  Yes    Education Details  form/technique with exercise    Person(s) Educated  Patient    Methods  Explanation;Demonstration    Comprehension  Verbalized understanding;Returned demonstration       PT Short Term Goals - 09/12/19 1419      PT SHORT TERM GOAL #1   Title  Patient will demonstrate complaince with HEP to speed recovery and reduce total number of visits.    Baseline  HEP given    Time  2    Period  Weeks    Status  New    Target Date  09/27/19        PT Long Term Goals - 10/24/19 1215      PT LONG TERM GOAL #1   Title  Patient will improve hip strength to 4/5 grossly to facilitate improved mobility with ADLs.    Baseline  3, 4-; 12/22 deferred to NV    Time  8    Period  Weeks    Status  On-going      PT LONG TERM GOAL #2   Title  Patient will demonstrate walking tolerance increased to 30 minutes  without increase in symptoms to demonstrate improved ability to perform walking program for cardiovascular health.    Baseline  10 minute tolerance (patient report); 12/22 10 minutes (per pt report)    Time  8    Period  Weeks    Status  On-going      PT LONG TERM GOAL #3   Title  Patient will report worst  pain of 3/10 during therapy to demonstrate reduced disability    Baseline  Worst 7/10; 12/22 worst 9/10    Time  8    Period  Weeks    Status  On-going      PT LONG TERM GOAL #4   Title  Patient will demonstrate ability to perform all bed mobility maneuvers without increase in pain for reduced disability with mobility and transfers with ADLs.    Baseline  Reports pain with bed mobility supine <> sidelying L/R, sidelying to sitting EOB; 12/22 pt able to perform I without increased pain    Time  8    Period  Weeks    Status  Achieved      PT LONG TERM GOAL #5   Title  Patient will improve 10MW speed to 1.0 m/sec to meet norms for minimal fall risk and full community ambulation.    Baseline  0.88 m/sec; 12/22 0.96 m/sec    Time  8    Period  Weeks    Status  On-going      Additional Long Term Goals   Additional Long Term Goals  Yes      PT LONG TERM GOAL #6   Title  Patient will increase 6MWT by 200 ft to demonstrate improvement in functional capacity for transition to cardiac walking program    Baseline  6MWT = 1195    Time  8    Period  Weeks    Status  New    Target Date  12/19/19            Plan - 11/06/19 1348    Clinical Impression Statement  Performed exercises to address glute med weakness and activation with performing transitioning such as sit to stands. Patient demonstrates considerably less pain after cueing to perform hip ER during sit to stand. However, she requires tactile cues to perform this with proper technique. Patient demonstrates good understanding of technique at end of session and will benefit from further skilled therapy to return to prior level  of function.    Personal Factors and Comorbidities  Age;Comorbidity 3+;Time since onset of injury/illness/exacerbation    Comorbidities  MCI, HTN, CA, cataracts    Examination-Activity Limitations  Bend;Sit;Squat;Stairs;Stand;Sleep    Examination-Participation Restrictions  Cleaning;Community Activity    Stability/Clinical Decision Making  Evolving/Moderate complexity    Rehab Potential  Fair    Clinical Impairments Affecting Rehab Potential  MCI    PT Frequency  2x / week   Eval and 1 f/u visit   PT Treatment/Interventions  ADLs/Self Care Home Management;Therapeutic exercise;Patient/family education;Neuromuscular re-education;Therapeutic activities;Functional mobility training;Balance training;Manual techniques;Gait training;Stair training;Moist Heat;Electrical Stimulation;Cryotherapy;Taping;Dry needling;Energy conservation;Passive range of motion;Joint Manipulations    PT Next Visit Plan  Continue with light intensity LE strengthening/manual therapy    PT Home Exercise Plan  SKTC, bridges, hamstring stretch, SLR, walking 15 min, ankle pumps, seated marches    Consulted and Agree with Plan of Care  Patient       Patient will benefit from skilled therapeutic intervention in order to improve the following deficits and impairments:  Decreased range of motion, Postural dysfunction, Pain, Abnormal gait, Decreased activity tolerance, Decreased coordination, Decreased endurance, Decreased balance, Decreased mobility, Difficulty walking, Hypomobility, Increased muscle spasms, Impaired perceived functional ability, Decreased strength, Impaired flexibility  Visit Diagnosis: Pain in left hip  Pain in right hip  Difficulty in walking, not elsewhere classified     Problem List Patient Active Problem List   Diagnosis Date Noted  .  Avitaminosis D 08/24/2019  . Insomnia due to mental condition 05/31/2019  . Bipolar I disorder, most recent episode depressed ([place]) 05/31/2019  . Mild cognitive  disorder 05/31/2019  . Alcohol use disorder, moderate, in sustained remission ([place]) 05/31/2019  . Elevated tumor markers 05/27/2019  . GAD (generalized anxiety disorder) 04/10/2019  . Altered mental status 04/10/2019  . MDD (major depressive disorder), severe ([facility]) 01/31/2019  . High serum carbohydrate antigen 19-9 (CA19-9) 12/01/2018  . Adjustment disorder with anxious mood 11/24/2018  . Essential hypertension 11/24/2018  . Screening for lipid disorders 11/24/2018  . B12 deficiency 10/17/2018  . Incisional hernia, without obstruction or gangrene 09/12/2018  . Genetic testing 04/07/2018  . Family history of breast cancer 03/24/2018  . Family history of lung cancer 03/24/2018  . Malignant neoplasm of upper-outer quadrant of right breast in female, estrogen receptor positive ([place]) 03/17/2018  . Malignant neoplasm of upper-inner quadrant of right breast in female, estrogen receptor positive ([facility]) 03/17/2018  . History of alcohol dependence ([place]) 02/07/2018  . History of psychosis 02/07/2018  . History of insomnia 02/07/2018    Blythe Stanford, PT DPT 11/06/2019, 1:50 PM  [facility] [address], [place], [address] Phone: [phone]   Fax:  [phone]  Name: Kendra [name] MRN: [id number] Date of Birth: [birth year]

## 2019-11-07 ENCOUNTER — Ambulatory Visit: Payer: Medicare Other | Admitting: Licensed Clinical Social Worker

## 2019-11-08 ENCOUNTER — Other Ambulatory Visit: Payer: Self-pay

## 2019-11-08 ENCOUNTER — Encounter: Payer: Self-pay | Admitting: Psychiatry

## 2019-11-08 ENCOUNTER — Ambulatory Visit (INDEPENDENT_AMBULATORY_CARE_PROVIDER_SITE_OTHER): Payer: Medicare PPO | Admitting: Psychiatry

## 2019-11-08 DIAGNOSIS — F3176 Bipolar disorder, in full remission, most recent episode depressed: Secondary | ICD-10-CM | POA: Diagnosis not present

## 2019-11-08 DIAGNOSIS — F5105 Insomnia due to other mental disorder: Secondary | ICD-10-CM | POA: Diagnosis not present

## 2019-11-08 DIAGNOSIS — F09 Unspecified mental disorder due to known physiological condition: Secondary | ICD-10-CM

## 2019-11-08 DIAGNOSIS — F411 Generalized anxiety disorder: Secondary | ICD-10-CM | POA: Diagnosis not present

## 2019-11-08 MED ORDER — TRAZODONE HCL 50 MG PO TABS: 25.0000 mg | ORAL_TABLET | Freq: Every evening | ORAL | 1 refills | Status: DC | PRN

## 2019-11-08 NOTE — Progress Notes (Signed)
Virtual Visit via Video Note  I connected with Kendra [name] on 11/08/19 at  1:00 PM EST by a video enabled telemedicine application and verified that I am speaking with the correct person using two identifiers.   I discussed the limitations of evaluation and management by telemedicine and the availability of in person appointments. The patient expressed understanding and agreed to proceed.     I discussed the assessment and treatment plan with the patient. The patient was provided an opportunity to ask questions and all were answered. The patient agreed with the plan and demonstrated an understanding of the instructions.   The patient was advised to call back or seek an in-person evaluation if the symptoms worsen or if the condition fails to improve as anticipated.    [facility] MD OP Progress Note  11/08/2019 1:51 PM Kendra [name]  MRN:  [id number]  Chief Complaint:  Chief Complaint    Follow-up     HPI: Kendra [name] is a 74 year old CF , married , lives in [place], has a history of bipolar disorder, GAD, insomnia, cognitive disorder, multifocal stage 1 right breast ca, S/P treatment - lumpectomy, ovarian cyst removal was evaluated by telemedicine today.  Patient today reports she is currently doing well on the current medication regimen.  She denies any side effects.  She had a good holiday break.  She enjoyed with her husband and friends.  She reports sleep continues to be good.  She takes trazodone as needed.  Patient reports she continues to be in physical therapy twice a week.  It does help her to get out of her house and work in physical therapy.  Patient denies any suicidality, homicidality or perceptual disturbances.  She continues to work with Ms. Alden Hipp and has upcoming appointment.  She appears to be alert, oriented to person place time situation and was able to answer questions appropriately.   Visit Diagnosis:    ICD-10-CM   1. Bipolar disorder, in full  remission, most recent episode depressed ([place] [facility])  F31.76   2. GAD (generalized anxiety disorder)  F41.1   3. Insomnia due to mental condition  F51.05 traZODone (DESYREL) 50 MG tablet  4. Mild cognitive disorder  F09     Past Psychiatric History: I have reviewed past psychiatric history from my progress note on 03/01/2019.  Past trials of Prozac, risperidone, Seroquel, Ativan  Past Medical History:  Past Medical History:  Diagnosis Date  . Anxiety   . Breast cancer ([facility]) 03/2018   right breast cancer at 11:00 and 1:00  . Bursitis    bilateral hips and knees  . Complication of anesthesia   . Depression   . Family history of breast cancer 03/24/2018  . Family history of lung cancer 03/24/2018  . History of alcohol dependence ([facility]) 2007   no ETOH; resolved since 2007  . History of hiatal hernia   . History of psychosis 2014   due to sleep disturbance  . Hypertension   . Personal history of radiation therapy 06/2018-07/2018   right breast ca  . PONV (postoperative nausea and vomiting)     Past Surgical History:  Procedure Laterality Date  . BREAST BIOPSY Right 03/14/2018   11:00 DCIS and invasive ductal carcinoma  . BREAST BIOPSY Right 03/14/2018   1:00 Invasive ductal carcinoma  . BREAST LUMPECTOMY Right 04/27/2018   lumpectomy of 11 and 1:00 cancers, clear margins, negative LN  . BREAST LUMPECTOMY WITH RADIOACTIVE SEED AND SENTINEL LYMPH NODE BIOPSY Right 04/27/2018  Procedure: RIGHT BREAST LUMPECTOMY WITH RADIOACTIVE SEED X 2 AND RIGHT SENTINEL LYMPH NODE BIOPSY;  Surgeon: [name], MD;  Location: [facility];  Service: General;  Laterality: Right;  . CATARACT EXTRACTION W/PHACO Left 08/30/2019   Procedure: CATARACT EXTRACTION PHACO AND INTRAOCULAR LENS PLACEMENT ([place]) LEFT panoptix toric  01:20.5  12.7%  10.26;  Surgeon: Leandrew Koyanagi, MD;  Location: [facility];  Service: Ophthalmology;  Laterality: Left;  . CATARACT EXTRACTION W/PHACO Right  09/20/2019   Procedure: CATARACT EXTRACTION PHACO AND INTRAOCULAR LENS PLACEMENT ([facility]) RIGHT 5.71  00:36.4  15.7%;  Surgeon: Leandrew Koyanagi, MD;  Location: [facility];  Service: Ophthalmology;  Laterality: Right;  . LAPAROTOMY N/A 02/10/2018   Procedure: EXPLORATORY LAPAROTOMY;  Surgeon: Isabel Caprice, MD;  [facility];  Service: Gynecology;  Laterality: N/A;  . MASS EXCISION  01/2018   abdominal  . OVARIAN CYST REMOVAL    . SALPINGOOPHORECTOMY Bilateral 02/10/2018   Procedure: BILATERAL SALPINGO OOPHORECTOMY; PERITONEAL WASHINGS;  Surgeon: Isabel Caprice, MD;  Location[facility];  Service: Gynecology;  Laterality: Bilateral;  . TONSILLECTOMY    . TONSILLECTOMY AND ADENOIDECTOMY  1953    Family Psychiatric History: I have reviewed family psychiatric history from my progress note on 03/01/2019. Family History:  Family History  Problem Relation Age of Onset  . Diabetes Mother   . Breast cancer Mother 36  . Hypertension Mother   . Lung cancer Father        asbestos exposure  . Heart disease Brother 63  . Breast cancer Cousin 63       paternal cousin  . Heart disease Paternal Grandmother 4  . Heart disease Paternal Grandfather     Social History: I have reviewed social history from my progress note on 03/01/2019. Social History   Socioeconomic History  . Marital status: Married    Spouse name: robert  . Number of children: 0  . Years of education: Not on file  . Highest education level: Some college, no degree  Occupational History  . Occupation: retired Furniture conservator/restorer of education  Tobacco Use  . Smoking status: Never Smoker  . Smokeless tobacco: Never Used  Substance and Sexual Activity  . Alcohol use: Not Currently    Comment: alcohol dependence prior to 2007  . Drug use: Never  . Sexual activity: Yes    Partners: Male    Birth control/protection: Surgical  Other Topics Concern  . Not on file  Social History Narrative  . Not on file    Social Determinants of Health   Financial Resource Strain: Low Risk   . Difficulty of Paying Living Expenses: Not hard at all  Food Insecurity: No Food Insecurity  . Worried About Charity fundraiser in the Last Year: Never true  . Ran Out of Food in the Last Year: Never true  Transportation Needs: No Transportation Needs  . Lack of Transportation (Medical): No  . Lack of Transportation (Non-Medical): No  Physical Activity: Sufficiently Active  . Days of Exercise per Week: 5 days  . Minutes of Exercise per Session: 40 min  Stress: Stress Concern Present  . Feeling of Stress : Very much  Social Connections: Unknown  . Frequency of Communication with Friends and Family: Not on file  . Frequency of Social Gatherings with Friends and Family: Not on file  . Attends Religious Services: More than 4 times per year  . Active Member of Clubs or Organizations: Yes  .  Attends Archivist Meetings: 1 to 4 times per year  . Marital Status: Married    Allergies:  Allergies  Allergen Reactions  . Hydroxyzine Anaphylaxis    Tongue swollen  . Shrimp [Shellfish Allergy] Other (See Comments)    Food poisoning     Metabolic Disorder Labs: Lab Results  Component Value Date   HGBA1C 5.4 04/13/2019   Lab Results  Component Value Date   PROLACTIN 101.9 06/20/2015   Lab Results  Component Value Date   CHOL 173 04/13/2019   TRIG 78 04/13/2019   HDL 62 04/13/2019   LDLCALC 95 04/13/2019   LDLCALC 103 06/02/2017   Lab Results  Component Value Date   TSH 2.31 04/13/2019   TSH 2.543 07/25/2018    Therapeutic Level Labs: No results found for: LITHIUM No results found for: VALPROATE No components found for:  CBMZ  Current Medications: Current Outpatient Medications  Medication Sig Dispense Refill  . cyanocobalamin (,VITAMIN B-12,) 1000 MCG/ML injection Inject 1 mL (1,000 mcg total) into the muscle every 30 (thirty) days. 3 mL 3  . docusate sodium (COLACE) 100 MG capsule  Take 1 capsule (100 mg total) by mouth daily. 30 capsule 3  . ERGOCALCIFEROL PO Take 2,400 mg by mouth daily.    Marland Kitchen FLUoxetine HCl 60 MG TABS Take 60 mg by mouth daily. 90 tablet 1  . letrozole (FEMARA) 2.5 MG tablet Take 1 tablet (2.5 mg total) by mouth daily. 90 tablet 3  . lisinopril-hydrochlorothiazide (ZESTORETIC) 20-12.5 MG tablet Take 1 tablet by mouth daily. 90 tablet 3  . Melatonin 10 MG TABS Take 30 mg by mouth at bedtime.    . naproxen (NAPROSYN) 375 MG tablet     . naproxen sodium (ALEVE) 220 MG tablet Take 1 tablet (220 mg total) by mouth 2 (two) times daily as needed. 60 tablet 1  . risperiDONE (RISPERDAL) 3 MG tablet Take 0.5 tablets (1.5 mg total) by mouth 2 (two) times daily. 90 tablet 1  . traZODone (DESYREL) 50 MG tablet Take 0.5-1 tablets (25-50 mg total) by mouth at bedtime as needed for sleep. 90 tablet 1   Current Facility-Administered Medications  Medication Dose Route Frequency Provider Last Rate Last Admin  . lidocaine (PF) (XYLOCAINE) 1 % injection 4 mL  4 mL Intradermal Once Virginia Crews, MD      . lidocaine (PF) (XYLOCAINE) 1 % injection 4 mL  4 mL Intradermal Once Virginia Crews, MD      . methylPREDNISolone acetate (DEPO-MEDROL) injection 40 mg  40 mg Intramuscular Once Virginia Crews, MD      . methylPREDNISolone acetate (DEPO-MEDROL) injection 40 mg  40 mg Intramuscular Once Brita Romp Dionne Bucy, MD         Musculoskeletal: Strength & Muscle Tone: [place]: normal Patient leans: N/A  Psychiatric Specialty Exam: Review of Systems  Psychiatric/Behavioral: Negative for agitation, behavioral problems, confusion, decreased concentration, dysphoric mood, hallucinations, self-injury, sleep disturbance and suicidal ideas. The patient is not nervous/anxious and is not hyperactive.   All other systems reviewed and are negative.   There were no vitals taken for this visit.There is no height or weight on file to calculate BMI.  General  Appearance: Casual  Eye Contact:  Fair  Speech:  Clear and Coherent  Volume:  Normal  Mood:  Euthymic  Affect:  Congruent  Thought Process:  Goal Directed and Descriptions of Associations: Intact  Orientation:  Full (Time, Place, and Person)  Thought Content:  Logical   Suicidal Thoughts:  No  Homicidal Thoughts:  No  Memory:  Immediate;   Fair Recent;   Fair Remote;   Fair  Judgement:  Fair  Insight:  Fair  Psychomotor Activity:  Normal  Concentration:  Concentration: Fair and Attention Span: Fair  Recall:  [employer] of Knowledge: Fair  Language: Fair  Akathisia:  No  Handed:  Right  AIMS (if indicated): denies tremors, rigidity  Assets:  Communication Skills Desire for Improvement Housing Social Support  ADL's:  Intact  Cognition: WNL  Sleep:  Fair   Screenings: PHQ2-9     Office Visit from 11/24/2018 in [facility]  PHQ-2 Total Score  0  PHQ-9 Total Score  3       Assessment and Plan: Kendra [name] is a 74 year old Caucasian female, married, lives in [place], has a history of bipolar disorder, GAD, insomnia, cognitive disorder, multifocal stage I right breast cancer status post lumpectomy, ovarian cyst removal was evaluated by telemedicine today.  She is biologically predisposed given her multiple health problems.  She also has psychosocial stressors of recent COVID-19 pandemic.  Patient however is currently doing well on the current medication regimen.  Plan as noted below.  Plan Bipolar disorder in remission Risperidone 3 mg p.o. daily divided dosage Prozac 60 mg p.o. daily  GAD-stable Continue psychotherapy sessions with Ms. Cecilie Lowers Continue Prozac.  Insomnia-stable Trazodone 25 to 50 mg p.o. nightly as needed Patient is currently in psychotherapy sessions for her pain. She also can continue melatonin p.o. nightly.  For cognitive changes-she will continue to work with neurology.  Follow-up in clinic in 2 months or sooner if needed.  March 4 at 4  PM  I have spent atleast 20 minutes non  face to face with patient today. More than 50 % of the time was spent for ordering medications ,psychoeducation and supportive psychotherapy and care coordination,as well as documenting clinical information in electronic health record. This note was generated in part or whole with voice recognition software. Voice recognition is usually quite accurate but there are transcription errors that can and very often do occur. I apologize for any typographical errors that were not detected and corrected.       Ursula Alert, MD 11/08/2019, 1:51 PM

## 2019-11-09 ENCOUNTER — Ambulatory Visit: Payer: Medicare PPO

## 2019-11-09 DIAGNOSIS — R262 Difficulty in walking, not elsewhere classified: Secondary | ICD-10-CM | POA: Diagnosis not present

## 2019-11-09 DIAGNOSIS — M25551 Pain in right hip: Secondary | ICD-10-CM

## 2019-11-09 DIAGNOSIS — M25552 Pain in left hip: Secondary | ICD-10-CM | POA: Diagnosis not present

## 2019-11-09 NOTE — Therapy (Signed)
[facility] [address], [place], [address] Phone: [phone]   Fax:  [phone]  Physical Therapy Treatment/ Progress Note  Patient Details  Name: Kendra [name] MRN: [id number] Date of Birth: Mar 01, 1946 No data recorded  Reporting Period: 10/24/2019 - 11/09/2019  Encounter Date: 11/09/2019  PT End of Session - 11/09/19 1758    Visit Number  14    Number of Visits  17    Date for PT Re-Evaluation  11/07/19    Authorization Type  4 / 10    PT Start Time  1300    PT Stop Time  1345    PT Time Calculation (min)  45 min    Activity Tolerance  Patient tolerated treatment well;No increased pain    Behavior During Therapy  WFL for tasks assessed/performed       Past Medical History:  Diagnosis Date  . Anxiety   . Breast cancer ([place]) 03/2018   right breast cancer at 11:00 and 1:00  . Bursitis    bilateral hips and knees  . Complication of anesthesia   . Depression   . Family history of breast cancer 03/24/2018  . Family history of lung cancer 03/24/2018  . History of alcohol dependence ([place]) 2007   no ETOH; resolved since 2007  . History of hiatal hernia   . History of psychosis 2014   due to sleep disturbance  . Hypertension   . Personal history of radiation therapy 06/2018-07/2018   right breast ca  . PONV (postoperative nausea and vomiting)     Past Surgical History:  Procedure Laterality Date  . BREAST BIOPSY Right 03/14/2018   11:00 DCIS and invasive ductal carcinoma  . BREAST BIOPSY Right 03/14/2018   1:00 Invasive ductal carcinoma  . BREAST LUMPECTOMY Right 04/27/2018   lumpectomy of 11 and 1:00 cancers, clear margins, negative LN  . BREAST LUMPECTOMY WITH RADIOACTIVE SEED AND SENTINEL LYMPH NODE BIOPSY Right 04/27/2018   Procedure: RIGHT BREAST LUMPECTOMY WITH RADIOACTIVE SEED X 2 AND RIGHT SENTINEL LYMPH NODE BIOPSY;  Surgeon: [name], MD;  Location: [facility];   Service: General;  Laterality: Right;  . CATARACT EXTRACTION W/PHACO Left 08/30/2019   Procedure: CATARACT EXTRACTION PHACO AND INTRAOCULAR LENS PLACEMENT ([place]) LEFT panoptix toric  01:20.5  12.7%  10.26;  Surgeon: Leandrew Koyanagi, MD;  Location: [facility];  Service: Ophthalmology;  Laterality: Left;  . CATARACT EXTRACTION W/PHACO Right 09/20/2019   Procedure: CATARACT EXTRACTION PHACO AND INTRAOCULAR LENS PLACEMENT ([facility]) RIGHT 5.71  00:36.4  15.7%;  Surgeon: Leandrew Koyanagi, MD;  Location: [facility];  Service: Ophthalmology;  Laterality: Right;  . LAPAROTOMY N/A 02/10/2018   Procedure: EXPLORATORY LAPAROTOMY;  Surgeon: Isabel Caprice, MD;  Location: [facility];  Service: Gynecology;  Laterality: N/A;  . MASS EXCISION  01/2018   abdominal  . OVARIAN CYST REMOVAL    . SALPINGOOPHORECTOMY Bilateral 02/10/2018   Procedure: BILATERAL SALPINGO OOPHORECTOMY; PERITONEAL WASHINGS;  Surgeon: Isabel Caprice, MD;  [facility];  Service: Gynecology;  Laterality: Bilateral;  . TONSILLECTOMY    . TONSILLECTOMY AND ADENOIDECTOMY  1953    There were no vitals filed for this visit.  Subjective Assessment - 11/09/19 1303    Subjective  Patient reports pain with moving and sneezing in L hip but not R hip. Reports she doesn't feel L hip will support her.    Pertinent History  Patient report unclear regarding onset. Visited MD on  Sept 16 and received injection with relief for approximately 1 month. Reports she cannot receive an additional injection pending eye surgery. Received dx from MD of hip bursitis in B hips. Reports pain with walking, stanindg, sitting 5/10 current 7/10 worst 3/10 best, with temporary relief when she is sitting "just right" and with naproxen. Agg: activity. Cormorbidities: hx of breast cancer, HTN, MCI, insomnia, cataracts    Limitations  Sitting;Standing;Walking;House hold activities    How long can you sit comfortably?  Pain constant, reduced with neutral  pelvic tilt    How long can you stand comfortably?  Pain constant, increases with time    How long can you walk comfortably?  Pain constant, increases with time    Patient Stated Goals  Be able to move without hurting, walk 1 hour    Currently in Pain?  Yes    Pain Score  9     Pain Location  Hip    Pain Orientation  Left    Pain Descriptors / Indicators  Aching;Sore    Pain Type  Chronic pain    Pain Onset  More than a month ago      TREATMENT  MT Patient positioned in sidelying for manual therapy perfromance to decrease muscular spasms and reduce pain STM to quads, glute med, glute min, vastus lateralis Marshmallow stick along ITB -- x 52min  TE Clams with hips in neutral over R sidelying x 10 Hooklying hip abd against green theraband x 10 BLE, x 10 RLE, x 10 LLE LTRs x 10 L/R STS x 5 - reduced cues necessary for correct sequencing  Standing hip circles SLS x 10 sec  Education re: tennis ball  PT Education - 11/09/19 1755    Education provided  Yes    Education Details  form/technique with exercise    Person(s) Educated  Patient    Methods  Explanation;Demonstration    Comprehension  Verbalized understanding;Returned demonstration       PT Short Term Goals - 09/12/19 1419      PT SHORT TERM GOAL #1   Title  Patient will demonstrate complaince with HEP to speed recovery and reduce total number of visits.    Baseline  HEP given    Time  2    Period  Weeks    Status  New    Target Date  09/27/19        PT Long Term Goals - 11/09/19 1801      PT LONG TERM GOAL #1   Title  Patient will improve hip strength to 4/5 grossly to facilitate improved mobility with ADLs.    Baseline  3, 4-; 12/22 deferred to NV; 11/09/2019: 4-/5    Time  8    Period  Weeks    Status  On-going      PT LONG TERM GOAL #2   Title  Patient will demonstrate walking tolerance increased to 30 minutes without increase in symptoms to demonstrate improved ability to perform walking program for  cardiovascular health.    Baseline  10 minute tolerance (patient report); 12/22 10 minutes (per pt report); 11/09/2019: 7 min    Time  8    Period  Weeks    Status  On-going      PT LONG TERM GOAL #3   Title  Patient will report worst pain of 3/10 during therapy to demonstrate reduced disability    Baseline  Worst 7/10; 12/22 worst 9/10; 11/09/2019: 9/10    Time  8  Period  Weeks    Status  On-going      PT LONG TERM GOAL #4   Title  Patient will demonstrate ability to perform all bed mobility maneuvers without increase in pain for reduced disability with mobility and transfers with ADLs.    Baseline  Reports pain with bed mobility supine <> sidelying L/R, sidelying to sitting EOB; 12/22 pt able to perform I without increased pain    Time  8    Period  Weeks    Status  Achieved      PT LONG TERM GOAL #5   Title  Patient will improve 10MW speed to 1.0 m/sec to meet norms for minimal fall risk and full community ambulation.    Baseline  0.88 m/sec; 12/22 0.96 m/sec; 11/09/2019: deferred    Time  8    Period  Weeks    Status  On-going      PT LONG TERM GOAL #6   Title  Patient will increase 6MWT by 200 ft to demonstrate improvement in functional capacity for transition to cardiac walking program    Baseline  6MWT = 1195; 1/7/20201: deferred    Time  8    Period  Weeks    Status  On-going            Plan - 11/09/19 1759    Clinical Impression Statement  Patient is making progress towards long term goals with less overall R hip pain with movements, however, patient continues to demonstrates increased pain along the L hip with transferring and walking activities. Patient demonstrates improvement with form and technique with movement, however, she continues to have limitations through greater AROM and with exercises that specifically target the glute med on the L side. Patient is making progress towards long term goals and will benefit form further skilled therapy to return to prior  level of function.8    Personal Factors and Comorbidities  Age;Comorbidity 3+;Time since onset of injury/illness/exacerbation    Comorbidities  MCI, HTN, CA, cataracts    Examination-Activity Limitations  Bend;Sit;Squat;Stairs;Stand;Sleep    Examination-Participation Restrictions  Cleaning;Community Activity    Stability/Clinical Decision Making  Evolving/Moderate complexity    Rehab Potential  Fair    Clinical Impairments Affecting Rehab Potential  MCI    PT Frequency  2x / week   Eval and 1 f/u visit   PT Treatment/Interventions  ADLs/Self Care Home Management;Therapeutic exercise;Patient/family education;Neuromuscular re-education;Therapeutic activities;Functional mobility training;Balance training;Manual techniques;Gait training;Stair training;Moist Heat;Electrical Stimulation;Cryotherapy;Taping;Dry needling;Energy conservation;Passive range of motion;Joint Manipulations    PT Next Visit Plan  Continue with light intensity LE strengthening/manual therapy    PT Home Exercise Plan  SKTC, bridges, hamstring stretch, SLR, walking 15 min, ankle pumps, seated marches    Consulted and Agree with Plan of Care  Patient       Patient will benefit from skilled therapeutic intervention in order to improve the following deficits and impairments:  Decreased range of motion, Postural dysfunction, Pain, Abnormal gait, Decreased activity tolerance, Decreased coordination, Decreased endurance, Decreased balance, Decreased mobility, Difficulty walking, Hypomobility, Increased muscle spasms, Impaired perceived functional ability, Decreased strength, Impaired flexibility  Visit Diagnosis: Difficulty in walking, not elsewhere classified  Pain in left hip  Pain in right hip     Problem List Patient Active Problem List   Diagnosis Date Noted  . Bipolar disorder, in full remission, most recent episode depressed ([place]) 11/08/2019  . Avitaminosis D 08/24/2019  . Insomnia due to mental condition 05/31/2019   . Bipolar I disorder,  most recent episode depressed ([place]) 05/31/2019  . Mild cognitive disorder 05/31/2019  . Alcohol use disorder, moderate, in sustained remission ([place]) 05/31/2019  . Elevated tumor markers 05/27/2019  . GAD (generalized anxiety disorder) 04/10/2019  . Altered mental status 04/10/2019  . MDD (major depressive disorder), severe ([place]) 01/31/2019  . High serum carbohydrate antigen 19-9 (CA19-9) 12/01/2018  . Adjustment disorder with anxious mood 11/24/2018  . Essential hypertension 11/24/2018  . Screening for lipid disorders 11/24/2018  . B12 deficiency 10/17/2018  . Incisional hernia, without obstruction or gangrene 09/12/2018  . Genetic testing 04/07/2018  . Family history of breast cancer 03/24/2018  . Family history of lung cancer 03/24/2018  . Malignant neoplasm of upper-outer quadrant of right breast in female, estrogen receptor positive ([facility]) 03/17/2018  . Malignant neoplasm of upper-inner quadrant of right breast in female, estrogen receptor positive ([place]) 03/17/2018  . History of alcohol dependence ([place]) 02/07/2018  . History of psychosis 02/07/2018  . History of insomnia 02/07/2018    Virgia Land, SPT  11/09/2019, 6:06 PM  [facility] [address] [place], [address] Phone: [phone]   Fax:  [phone]  Name: Kendra [name] MRN: [id number] Date of Birth: [birth year]

## 2019-11-09 NOTE — Addendum Note (Signed)
Addended by: Blain Pais on: 11/09/2019 06:05 PM   Modules accepted: Orders

## 2019-11-13 ENCOUNTER — Ambulatory Visit: Payer: Medicare PPO

## 2019-11-13 ENCOUNTER — Other Ambulatory Visit: Payer: Self-pay

## 2019-11-13 DIAGNOSIS — R262 Difficulty in walking, not elsewhere classified: Secondary | ICD-10-CM | POA: Diagnosis not present

## 2019-11-13 DIAGNOSIS — M25552 Pain in left hip: Secondary | ICD-10-CM | POA: Diagnosis not present

## 2019-11-13 DIAGNOSIS — M25551 Pain in right hip: Secondary | ICD-10-CM | POA: Diagnosis not present

## 2019-11-13 NOTE — Therapy (Signed)
[facility] [address], [place], [address] Phone: [phone]   Fax:  [phone]  Physical Therapy Treatment  Patient Details  Name: Kendra [name] MRN: [id number] Date of Birth: Jun 13, 1946 No data recorded  Encounter Date: 11/13/2019  PT End of Session - 11/13/19 1258    Visit Number  15    Number of Visits  17    Date for PT Re-Evaluation  11/07/19    Authorization Type  5 / 10    PT Start Time  1300    PT Stop Time  1345    PT Time Calculation (min)  45 min    Activity Tolerance  Patient tolerated treatment well;No increased pain    Behavior During Therapy  WFL for tasks assessed/performed       Past Medical History:  Diagnosis Date  . Anxiety   . Breast cancer ([facility]) 03/2018   right breast cancer at 11:00 and 1:00  . Bursitis    bilateral hips and knees  . Complication of anesthesia   . Depression   . Family history of breast cancer 03/24/2018  . Family history of lung cancer 03/24/2018  . History of alcohol dependence ([place]) 2007   no ETOH; resolved since 2007  . History of hiatal hernia   . History of psychosis 2014   due to sleep disturbance  . Hypertension   . Personal history of radiation therapy 06/2018-07/2018   right breast ca  . PONV (postoperative nausea and vomiting)     Past Surgical History:  Procedure Laterality Date  . BREAST BIOPSY Right 03/14/2018   11:00 DCIS and invasive ductal carcinoma  . BREAST BIOPSY Right 03/14/2018   1:00 Invasive ductal carcinoma  . BREAST LUMPECTOMY Right 04/27/2018   lumpectomy of 11 and 1:00 cancers, clear margins, negative LN  . BREAST LUMPECTOMY WITH RADIOACTIVE SEED AND SENTINEL LYMPH NODE BIOPSY Right 04/27/2018   Procedure: RIGHT BREAST LUMPECTOMY WITH RADIOACTIVE SEED X 2 AND RIGHT SENTINEL LYMPH NODE BIOPSY;  Surgeon: [name], MD;  Location: [facility];  Service: General;  Laterality: Right;  . CATARACT  EXTRACTION W/PHACO Left 08/30/2019   Procedure: CATARACT EXTRACTION PHACO AND INTRAOCULAR LENS PLACEMENT ([facility]) LEFT panoptix toric  01:20.5  12.7%  10.26;  Surgeon: Leandrew Koyanagi, MD;  Location: [facility];  Service: Ophthalmology;  Laterality: Left;  . CATARACT EXTRACTION W/PHACO Right 09/20/2019   Procedure: CATARACT EXTRACTION PHACO AND INTRAOCULAR LENS PLACEMENT ([facility]) RIGHT 5.71  00:36.4  15.7%;  Surgeon: Leandrew Koyanagi, MD;  Location: [facility];  Service: Ophthalmology;  Laterality: Right;  . LAPAROTOMY N/A 02/10/2018   Procedure: EXPLORATORY LAPAROTOMY;  Surgeon: Isabel Caprice, MD;  [facility];  Service: Gynecology;  Laterality: N/A;  . MASS EXCISION  01/2018   abdominal  . OVARIAN CYST REMOVAL    . SALPINGOOPHORECTOMY Bilateral 02/10/2018   Procedure: BILATERAL SALPINGO OOPHORECTOMY; PERITONEAL WASHINGS;  Surgeon: Isabel Caprice, MD;  [facility];  Service: Gynecology;  Laterality: Bilateral;  . TONSILLECTOMY    . TONSILLECTOMY AND ADENOIDECTOMY  1953    There were no vitals filed for this visit.  Subjective Assessment - 11/13/19 1301    Subjective  Patient reports soreness after taking down Christmas decorations over the weekend taking the whole day. Didn't sleep well last night.    Pertinent History  Patient report unclear regarding onset. Visited MD on Sept 16 and received injection with relief for approximately 1 month.  Reports she cannot receive an additional injection pending eye surgery. Received dx from MD of hip bursitis in B hips. Reports pain with walking, stanindg, sitting 5/10 current 7/10 worst 3/10 best, with temporary relief when she is sitting "just right" and with naproxen. Agg: activity. Cormorbidities: hx of breast cancer, HTN, MCI, insomnia, cataracts    Limitations  Sitting;Standing;Walking;House hold activities    How long can you sit comfortably?  Pain constant, reduced with neutral pelvic tilt    How long can you  stand comfortably?  Pain constant, increases with time    How long can you walk comfortably?  Pain constant, increases with time    Patient Stated Goals  Be able to move without hurting, walk 1 hour    Currently in Pain?  Yes    Pain Score  9     Pain Location  Hip    Pain Orientation  Left    Pain Descriptors / Indicators  Aching;Sore    Pain Type  Chronic pain    Pain Onset  More than a month ago     TREATMENT   MT Soft tissue mobilization, myofascial release, and ischemic release to L quads with focus on vastus lateralis, glute med, glute min, adductors; R adductors, rectus femoris, glute med  NMR PNF for posterior pelvic depression with rhythmic initiation, stabilizing reversals, dynamic reversals x 10 min  TE Segmental bridges 2 x 10 - cues for glute activation and foot position reduces pt c/o hip pain Pilates tabletop isometric BLE hip flexion/BUE shoulder extension with hands on knees for core activation 5 sec holds x 10 Standing quad extension on 4" step with cues for pelvic depression and extensor activation with 5 sec holds x 10 R/L - pt improves pelvic control with cueing but deficits remain Standing hip flexion followed by quad extension and slow eccentric lowering - cues for stabilization on contralateral side x 10 R, x 5 L  Pt reports reduced pain following treatment    PT Education - 11/13/19 1258    Education provided  Yes    Education Details  form/technique with exercise    Person(s) Educated  Patient    Methods  Explanation;Demonstration;Tactile cues;Verbal cues    Comprehension  Verbalized understanding;Returned demonstration;Verbal cues required;Tactile cues required       PT Short Term Goals - 09/12/19 1419      PT SHORT TERM GOAL #1   Title  Patient will demonstrate complaince with HEP to speed recovery and reduce total number of visits.    Baseline  HEP given    Time  2    Period  Weeks    Status  New    Target Date  09/27/19        PT Long  Term Goals - 11/09/19 1801      PT LONG TERM GOAL #1   Title  Patient will improve hip strength to 4/5 grossly to facilitate improved mobility with ADLs.    Baseline  3, 4-; 12/22 deferred to NV; 11/09/2019: 4-/5    Time  8    Period  Weeks    Status  On-going      PT LONG TERM GOAL #2   Title  Patient will demonstrate walking tolerance increased to 30 minutes without increase in symptoms to demonstrate improved ability to perform walking program for cardiovascular health.    Baseline  10 minute tolerance (patient report); 12/22 10 minutes (per pt report); 11/09/2019: 7 min    Time  8    Period  Weeks    Status  On-going      PT LONG TERM GOAL #3   Title  Patient will report worst pain of 3/10 during therapy to demonstrate reduced disability    Baseline  Worst 7/10; 12/22 worst 9/10; 11/09/2019: 9/10    Time  8    Period  Weeks    Status  On-going      PT LONG TERM GOAL #4   Title  Patient will demonstrate ability to perform all bed mobility maneuvers without increase in pain for reduced disability with mobility and transfers with ADLs.    Baseline  Reports pain with bed mobility supine <> sidelying L/R, sidelying to sitting EOB; 12/22 pt able to perform I without increased pain    Time  8    Period  Weeks    Status  Achieved      PT LONG TERM GOAL #5   Title  Patient will improve 10MW speed to 1.0 m/sec to meet norms for minimal fall risk and full community ambulation.    Baseline  0.88 m/sec; 12/22 0.96 m/sec; 11/09/2019: deferred    Time  8    Period  Weeks    Status  On-going      PT LONG TERM GOAL #6   Title  Patient will increase 6MWT by 200 ft to demonstrate improvement in functional capacity for transition to cardiac walking program    Baseline  6MWT = 1195; 1/7/20201: deferred    Time  8    Period  Weeks    Status  On-going            Plan - 11/13/19 1356    Clinical Impression Statement  Patient demonstrates improvement in posterior pelvic depression and pelvic  control following PNF and cues with supine therex. However deficits in pelvic stabilization and hip strength remain apparent in standing particularly with SLS activities. Pt c/o pain consistent with muscular soreness from therapeutic exercise; education provided regarding DOMS phenomena and patient expresses comprehension. Patient will benefit from additional skilled physical therapy to reduce pain and return to PLOF.    Personal Factors and Comorbidities  Age;Comorbidity 3+;Time since onset of injury/illness/exacerbation    Comorbidities  MCI, HTN, CA, cataracts    Examination-Activity Limitations  Bend;Sit;Squat;Stairs;Stand;Sleep    Examination-Participation Restrictions  Cleaning;Community Activity    Stability/Clinical Decision Making  Evolving/Moderate complexity    Rehab Potential  Fair    Clinical Impairments Affecting Rehab Potential  MCI    PT Frequency  2x / week   Eval and 1 f/u visit   PT Treatment/Interventions  ADLs/Self Care Home Management;Therapeutic exercise;Patient/family education;Neuromuscular re-education;Therapeutic activities;Functional mobility training;Balance training;Manual techniques;Gait training;Stair training;Moist Heat;Electrical Stimulation;Cryotherapy;Taping;Dry needling;Energy conservation;Passive range of motion;Joint Manipulations    PT Next Visit Plan  Continue with light intensity LE strengthening/manual therapy, proprioceptive exercises for lumbopelvic control; progress HEP    PT Home Exercise Plan  SKTC, bridges, hamstring stretch, SLR, walking 15 min, ankle pumps, seated marches    Consulted and Agree with Plan of Care  Patient       Patient will benefit from skilled therapeutic intervention in order to improve the following deficits and impairments:  Decreased range of motion, Postural dysfunction, Pain, Abnormal gait, Decreased activity tolerance, Decreased coordination, Decreased endurance, Decreased balance, Decreased mobility, Difficulty walking,  Hypomobility, Increased muscle spasms, Impaired perceived functional ability, Decreased strength, Impaired flexibility  Visit Diagnosis: Pain in left hip  Pain in right hip  Difficulty in  walking, not elsewhere classified     Problem List Patient Active Problem List   Diagnosis Date Noted  . Bipolar disorder, in full remission, most recent episode depressed ([place]) 11/08/2019  . Avitaminosis D 08/24/2019  . Insomnia due to mental condition 05/31/2019  . Bipolar I disorder, most recent episode depressed ([place]) 05/31/2019  . Mild cognitive disorder 05/31/2019  . Alcohol use disorder, moderate, in sustained remission ([facility]) 05/31/2019  . Elevated tumor markers 05/27/2019  . GAD (generalized anxiety disorder) 04/10/2019  . Altered mental status 04/10/2019  . MDD (major depressive disorder), severe ([place]) 01/31/2019  . High serum carbohydrate antigen 19-9 (CA19-9) 12/01/2018  . Adjustment disorder with anxious mood 11/24/2018  . Essential hypertension 11/24/2018  . Screening for lipid disorders 11/24/2018  . B12 deficiency 10/17/2018  . Incisional hernia, without obstruction or gangrene 09/12/2018  . Genetic testing 04/07/2018  . Family history of breast cancer 03/24/2018  . Family history of lung cancer 03/24/2018  . Malignant neoplasm of upper-outer quadrant of right breast in female, estrogen receptor positive ([facility]) 03/17/2018  . Malignant neoplasm of upper-inner quadrant of right breast in female, estrogen receptor positive ([place]) 03/17/2018  . History of alcohol dependence ([place]) 02/07/2018  . History of psychosis 02/07/2018  . History of insomnia 02/07/2018    Virgia Land, SPT 11/13/2019, 2:01 PM  [facility] [address], [place], [address] Phone: [phone]   Fax:  [phone]  Name: Tyriel Kargbo MRN: [id number] Date of Birth: 04/28/1946

## 2019-11-20 ENCOUNTER — Ambulatory Visit: Payer: Medicare PPO

## 2019-11-20 ENCOUNTER — Other Ambulatory Visit: Payer: Self-pay

## 2019-11-20 DIAGNOSIS — M25552 Pain in left hip: Secondary | ICD-10-CM

## 2019-11-20 DIAGNOSIS — M25551 Pain in right hip: Secondary | ICD-10-CM | POA: Diagnosis not present

## 2019-11-20 DIAGNOSIS — R262 Difficulty in walking, not elsewhere classified: Secondary | ICD-10-CM

## 2019-11-20 NOTE — Therapy (Signed)
[facility] [address] [place], [address] Phone: [phone]   Fax:  [phone]  Physical Therapy Treatment  Patient Details  Name: Kendra [name] MRN: [id number] Date of Birth: 1945-11-03 No data recorded  Encounter Date: 11/20/2019  PT End of Session - 11/20/19 1303    Visit Number  16    Number of Visits  22    Date for PT Re-Evaluation  12/07/19    Authorization Type  5 / 10    PT Start Time  [id number]    PT Stop Time  1345    PT Time Calculation (min)  42 min    Activity Tolerance  Patient tolerated treatment well;No increased pain    Behavior During Therapy  WFL for tasks assessed/performed       Past Medical History:  Diagnosis Date  . Anxiety   . Breast cancer ([place]) 03/2018   right breast cancer at 11:00 and 1:00  . Bursitis    bilateral hips and knees  . Complication of anesthesia   . Depression   . Family history of breast cancer 03/24/2018  . Family history of lung cancer 03/24/2018  . History of alcohol dependence ([place]) 2007   no ETOH; resolved since 2007  . History of hiatal hernia   . History of psychosis 2014   due to sleep disturbance  . Hypertension   . Personal history of radiation therapy 06/2018-07/2018   right breast ca  . PONV (postoperative nausea and vomiting)     Past Surgical History:  Procedure Laterality Date  . BREAST BIOPSY Right 03/14/2018   11:00 DCIS and invasive ductal carcinoma  . BREAST BIOPSY Right 03/14/2018   1:00 Invasive ductal carcinoma  . BREAST LUMPECTOMY Right 04/27/2018   lumpectomy of 11 and 1:00 cancers, clear margins, negative LN  . BREAST LUMPECTOMY WITH RADIOACTIVE SEED AND SENTINEL LYMPH NODE BIOPSY Right 04/27/2018   Procedure: RIGHT BREAST LUMPECTOMY WITH RADIOACTIVE SEED X 2 AND RIGHT SENTINEL LYMPH NODE BIOPSY;  Surgeon: [name], MD;  Location: [facility];  Service: General;  Laterality: Right;  . CATARACT  EXTRACTION W/PHACO Left 08/30/2019   Procedure: CATARACT EXTRACTION PHACO AND INTRAOCULAR LENS PLACEMENT ([place]) LEFT panoptix toric  01:20.5  12.7%  10.26;  Surgeon: Leandrew Koyanagi, MD;  Location: [facility];  Service: Ophthalmology;  Laterality: Left;  . CATARACT EXTRACTION W/PHACO Right 09/20/2019   Procedure: CATARACT EXTRACTION PHACO AND INTRAOCULAR LENS PLACEMENT ([facility]) RIGHT 5.71  00:36.4  15.7%;  Surgeon: Leandrew Koyanagi, MD;  Location: [facility];  Service: Ophthalmology;  Laterality: Right;  . LAPAROTOMY N/A 02/10/2018   Procedure: EXPLORATORY LAPAROTOMY;  Surgeon: Isabel Caprice, MD;  Location: [facility];  Service: Gynecology;  Laterality: N/A;  . MASS EXCISION  01/2018   abdominal  . OVARIAN CYST REMOVAL    . SALPINGOOPHORECTOMY Bilateral 02/10/2018   Procedure: BILATERAL SALPINGO OOPHORECTOMY; PERITONEAL WASHINGS;  Surgeon: Isabel Caprice, MD;  Location: [facility];  Service: Gynecology;  Laterality: Bilateral;  . TONSILLECTOMY    . TONSILLECTOMY AND ADENOIDECTOMY  1953    There were no vitals filed for this visit.  Subjective Assessment - 11/20/19 1304    Subjective  Patient reports no change in hip pain. Wakes her from sleep. Constant ache.    Pertinent History  Patient report unclear regarding onset. Visited MD on Sept 16 and received injection with relief for approximately 1 month. Reports she cannot receive an additional injection  pending eye surgery. Received dx from MD of hip bursitis in B hips. Reports pain with walking, stanindg, sitting 5/10 current 7/10 worst 3/10 best, with temporary relief when she is sitting "just right" and with naproxen. Agg: activity. Cormorbidities: hx of breast cancer, HTN, MCI, insomnia, cataracts    Limitations  Sitting;Standing;Walking;House hold activities    How long can you sit comfortably?  Pain constant, reduced with neutral pelvic tilt    How long can you stand comfortably?  Pain constant, increases with time    How  long can you walk comfortably?  Pain constant, increases with time    Patient Stated Goals  Be able to move without hurting, walk 1 hour    Currently in Pain?  Yes    Pain Score  7     Pain Location  Hip    Pain Orientation  Left;Right    Pain Descriptors / Indicators  Aching;Sore    Pain Type  Chronic pain    Pain Onset  More than a month ago       TREATMENT  MT Soft tissue mobilization, myofascial release, and ischemic release to L/R quads with focus on vastus lateralis, rectus femoris, glute med Lateral distraction L hip x 30 oscillations - pt reports pain abolished following treatment STK AAROM for gentle hip mobilizaiton  MT for pain control   TE Bridges x 20 with UE support - cues for improved hip extension/glute activation Bridges x 20 without UE support - note reduced ROM. Pt notes this increases her pain Standing quad extension on 4" step with cues for pelvic depression and extensor activation with 5 sec holds x 10 R/L - pt improves pelvic control with cueing but deficits remain Glute med hip hikes on 4" step x 20 R/L - glute med activation and pelvic control improved with cueing but diminishes with fatigue with multiple compensations evident including use of momentum and truncal lean Step ups 8" 2 x 10 R/L Gait analysis: demonstrates poor loading response L>R Heel raises BLE with two-finger UE support for balance x 20  TE for BLE strengthening    PT Short Term Goals - 09/12/19 1419      PT SHORT TERM GOAL #1   Title  Patient will demonstrate complaince with HEP to speed recovery and reduce total number of visits.    Baseline  HEP given    Time  2    Period  Weeks    Status  New    Target Date  09/27/19        PT Long Term Goals - 11/09/19 1801      PT LONG TERM GOAL #1   Title  Patient will improve hip strength to 4/5 grossly to facilitate improved mobility with ADLs.    Baseline  3, 4-; 12/22 deferred to NV; 11/09/2019: 4-/5    Time  8    Period  Weeks     Status  On-going      PT LONG TERM GOAL #2   Title  Patient will demonstrate walking tolerance increased to 30 minutes without increase in symptoms to demonstrate improved ability to perform walking program for cardiovascular health.    Baseline  10 minute tolerance (patient report); 12/22 10 minutes (per pt report); 11/09/2019: 7 min    Time  8    Period  Weeks    Status  On-going      PT LONG TERM GOAL #3   Title  Patient will report worst pain of  3/10 during therapy to demonstrate reduced disability    Baseline  Worst 7/10; 12/22 worst 9/10; 11/09/2019: 9/10    Time  8    Period  Weeks    Status  On-going      PT LONG TERM GOAL #4   Title  Patient will demonstrate ability to perform all bed mobility maneuvers without increase in pain for reduced disability with mobility and transfers with ADLs.    Baseline  Reports pain with bed mobility supine <> sidelying L/R, sidelying to sitting EOB; 12/22 pt able to perform I without increased pain    Time  8    Period  Weeks    Status  Achieved      PT LONG TERM GOAL #5   Title  Patient will improve 10MW speed to 1.0 m/sec to meet norms for minimal fall risk and full community ambulation.    Baseline  0.88 m/sec; 12/22 0.96 m/sec; 11/09/2019: deferred    Time  8    Period  Weeks    Status  On-going      PT LONG TERM GOAL #6   Title  Patient will increase 6MWT by 200 ft to demonstrate improvement in functional capacity for transition to cardiac walking program    Baseline  6MWT = 1195; 1/7/20201: deferred    Time  8    Period  Weeks    Status  On-going            Plan - 11/20/19 1431    Clinical Impression Statement  Patient demonstrates mild improvement in pain compared to previous visits but pain report remains inconsistent complicating assessment. Patient able to tolerate increased load with therapeutic exercise but pelvic control and glute med activation in SLS remains deficient. Plan to increase intensity of HEP to supplement  clinical therapy. Patient will benefit from additional skilled physical therapy to reduce pain and return to PLOF.    Personal Factors and Comorbidities  Age;Comorbidity 3+;Time since onset of injury/illness/exacerbation    Comorbidities  MCI, HTN, CA, cataracts    Examination-Activity Limitations  Bend;Sit;Squat;Stairs;Stand;Sleep    Examination-Participation Restrictions  Cleaning;Community Activity    Stability/Clinical Decision Making  Evolving/Moderate complexity    Rehab Potential  Fair    Clinical Impairments Affecting Rehab Potential  MCI    PT Frequency  2x / week   Eval and 1 f/u visit   PT Treatment/Interventions  ADLs/Self Care Home Management;Therapeutic exercise;Patient/family education;Neuromuscular re-education;Therapeutic activities;Functional mobility training;Balance training;Manual techniques;Gait training;Stair training;Moist Heat;Electrical Stimulation;Cryotherapy;Taping;Dry needling;Energy conservation;Passive range of motion;Joint Manipulations    PT Next Visit Plan  Continue with light intensity LE strengthening/manual therapy, proprioceptive exercises for lumbopelvic control; progress HEP    PT Home Exercise Plan  SKTC, bridges, hamstring stretch, SLR, walking 15 min, ankle pumps, seated marches    Consulted and Agree with Plan of Care  Patient       Patient will benefit from skilled therapeutic intervention in order to improve the following deficits and impairments:  Decreased range of motion, Postural dysfunction, Pain, Abnormal gait, Decreased activity tolerance, Decreased coordination, Decreased endurance, Decreased balance, Decreased mobility, Difficulty walking, Hypomobility, Increased muscle spasms, Impaired perceived functional ability, Decreased strength, Impaired flexibility  Visit Diagnosis: Pain in left hip  Pain in right hip  Difficulty in walking, not elsewhere classified     Problem List Patient Active Problem List   Diagnosis Date Noted  .  Bipolar disorder, in full remission, most recent episode depressed ([place]) 11/08/2019  . Avitaminosis D 08/24/2019  .  Insomnia due to mental condition 05/31/2019  . Bipolar I disorder, most recent episode depressed ([place]) 05/31/2019  . Mild cognitive disorder 05/31/2019  . Alcohol use disorder, moderate, in sustained remission ([place]) 05/31/2019  . Elevated tumor markers 05/27/2019  . GAD (generalized anxiety disorder) 04/10/2019  . Altered mental status 04/10/2019  . MDD (major depressive disorder), severe ([place]) 01/31/2019  . High serum carbohydrate antigen 19-9 (CA19-9) 12/01/2018  . Adjustment disorder with anxious mood 11/24/2018  . Essential hypertension 11/24/2018  . Screening for lipid disorders 11/24/2018  . B12 deficiency 10/17/2018  . Incisional hernia, without obstruction or gangrene 09/12/2018  . Genetic testing 04/07/2018  . Family history of breast cancer 03/24/2018  . Family history of lung cancer 03/24/2018  . Malignant neoplasm of upper-outer quadrant of right breast in female, estrogen receptor positive ([place]) 03/17/2018  . Malignant neoplasm of upper-inner quadrant of right breast in female, estrogen receptor positive ([place]) 03/17/2018  . History of alcohol dependence ([place]) 02/07/2018  . History of psychosis 02/07/2018  . History of insomnia 02/07/2018    Virgia Land, SPT 11/20/2019, 2:39 PM  [facility] [address] [place], [address] Phone: [phone]   Fax:  [phone]  Name: Kendra [name] MRN: [id number] Date of Birth: 03-22-1946

## 2019-11-21 ENCOUNTER — Encounter: Payer: Self-pay | Admitting: Licensed Clinical Social Worker

## 2019-11-21 ENCOUNTER — Ambulatory Visit (INDEPENDENT_AMBULATORY_CARE_PROVIDER_SITE_OTHER): Payer: Medicare PPO | Admitting: Licensed Clinical Social Worker

## 2019-11-21 DIAGNOSIS — F411 Generalized anxiety disorder: Secondary | ICD-10-CM

## 2019-11-21 DIAGNOSIS — F3176 Bipolar disorder, in full remission, most recent episode depressed: Secondary | ICD-10-CM

## 2019-11-21 NOTE — Progress Notes (Signed)
Virtual Visit via Telephone Note  I connected with Kendra [name] on 11/21/19 at 10:00 AM EST by telephone and verified that I am speaking with the correct person using two identifiers.   I discussed the limitations, risks, security and privacy concerns of performing an evaluation and management service by telephone and the availability of in person appointments. I also discussed with the patient that there may be a patient responsible charge related to this service. The patient expressed understanding and agreed to proceed.    I discussed the assessment and treatment plan with the patient. The patient was provided an opportunity to ask questions and all were answered. The patient agreed with the plan and demonstrated an understanding of the instructions.   The patient was advised to call back or seek an in-person evaluation if the symptoms worsen or if the condition fails to improve as anticipated.  I provided 30 minutes of non-face-to-face time during this encounter.   Kendra Hipp, LCSW   THERAPIST PROGRESS NOTE  Session Time: 1000  Participation Level: Active  Behavioral Response: NeatAlertAnxious  Type of Therapy: Individual Therapy  Treatment Goals addressed: Coping  Interventions: Supportive  Summary: Kendra [name] is a 74 y.o. female who presents with continued symptoms related to her diagnosis. Kendra [name] reports doing well since our last session. She reports having some anxiety around attempting to schedule her COVID-19 vaccine. She reports she was coordinating the shot through her primary care provider in order to get an appointment for the shot, but would stay on hold and then could never get past the automated service. LCSW validated Kendra [name]'s frustrations around this process, and validated Kendra [name]'s thoughts and feelings around the situation. Kendra [name] went on to discuss her eye surgeries, and how she is able to see much more clearly now and has been thankful for that. She reports  she is able to see things in a way she was not able to previously, and at times feels like she is seeing something for the first time. LCSW validated these feelings as well, and held space for Kendra [name] to discuss her new found appreciation for her surroundings. We discussed ways  She could utilize her site to practice mindfulness in the moment. [name] expressed understanding and agreement with all information presented. Kendra [name] stated she only wanted to have a 30 minute appointment today, and stated she did not have other things she wanted to discuss.    Suicidal/Homicidal: No   Therapist Response:  continues to work towards her tx goals but has not yet reached them. We will continue to work on improving emotional regulation and distress tolerance moving forward.   Plan: Return again in 2 weeks.  Diagnosis: Axis I: Bipolar disorder    Axis II: No diagnosis    Kendra Hipp, LCSW 11/21/2019

## 2019-11-22 ENCOUNTER — Ambulatory Visit (INDEPENDENT_AMBULATORY_CARE_PROVIDER_SITE_OTHER): Payer: Medicare PPO | Admitting: Family Medicine

## 2019-11-22 ENCOUNTER — Other Ambulatory Visit: Payer: Self-pay

## 2019-11-22 ENCOUNTER — Ambulatory Visit
Admission: RE | Admit: 2019-11-22 | Discharge: 2019-11-22 | Disposition: A | Discharge status: Home / Self Care | Payer: Medicare PPO | Attending: Family Medicine | Admitting: Family Medicine

## 2019-11-22 ENCOUNTER — Encounter: Payer: Self-pay | Admitting: Family Medicine

## 2019-11-22 ENCOUNTER — Ambulatory Visit
Admission: RE | Admit: 2019-11-22 | Discharge: 2019-11-22 | Disposition: A | Discharge status: Home / Self Care | Payer: Medicare PPO | Source: Ambulatory Visit | Attending: Family Medicine | Admitting: Family Medicine

## 2019-11-22 ENCOUNTER — Telehealth: Payer: Self-pay

## 2019-11-22 VITALS — BP 132/71 | HR 91 | Temp 95.3°F | Wt 199.8 lb

## 2019-11-22 DIAGNOSIS — M5432 Sciatica, left side: Secondary | ICD-10-CM | POA: Diagnosis not present

## 2019-11-22 DIAGNOSIS — M1611 Unilateral primary osteoarthritis, right hip: Secondary | ICD-10-CM | POA: Diagnosis not present

## 2019-11-22 DIAGNOSIS — M545 Low back pain: Secondary | ICD-10-CM | POA: Diagnosis not present

## 2019-11-22 DIAGNOSIS — M7061 Trochanteric bursitis, right hip: Secondary | ICD-10-CM

## 2019-11-22 DIAGNOSIS — M1612 Unilateral primary osteoarthritis, left hip: Secondary | ICD-10-CM | POA: Diagnosis not present

## 2019-11-22 MED ORDER — PREDNISONE 20 MG PO TABS: ORAL_TABLET | ORAL | 0 refills | Status: DC

## 2019-11-22 MED ORDER — GABAPENTIN 100 MG PO CAPS: 100.0000 mg | ORAL_CAPSULE | Freq: Three times a day (TID) | ORAL | 3 refills | Status: DC

## 2019-11-22 NOTE — Progress Notes (Signed)
Patient: Kendra [name] Female    DOB: August 02, 1946   74 y.o.   MRN: [id number] Visit Date: 11/22/2019  Today's Provider: Lavon Paganini, MD   Chief Complaint  Patient presents with  . Bursitis   Subjective:     HPI   Pt having bursitis flare up, pt takes naproxen but says it seems like its not helping it or easing it.  Steroid injections helped some, but not entirely.  Pt says it is mainly in her left hip, also says that sometimes after she walks it radiates over to the right hip and to her knee. Associated symptoms include numbness and tingling in (left foot) after walking for a long time (new problem). Pt would like to know if there are any recommendations on someone she should see next.    Worse with movement. Denies low back pain Doing PT - Worse with walking, coughing, sneezing, walking up or down stairs, changing positions in bed.  Had radiation of pain down one leg at PT last week with a certain movement Wakes her up at night hurting sometimes.  Allergies  Allergen Reactions  . Hydroxyzine Anaphylaxis    Tongue swollen  . Shrimp [Shellfish Allergy] Other (See Comments)    Food poisoning      Current Outpatient Medications:  .  cyanocobalamin (,VITAMIN B-12,) 1000 MCG/ML injection, Inject 1 mL (1,000 mcg total) into the muscle every 30 (thirty) days., Disp: 3 mL, Rfl: 3 .  docusate sodium (COLACE) 100 MG capsule, Take 1 capsule (100 mg total) by mouth daily., Disp: 30 capsule, Rfl: 3 .  ERGOCALCIFEROL PO, Take 2,400 mg by mouth daily., Disp: , Rfl:  .  FLUoxetine HCl 60 MG TABS, Take 60 mg by mouth daily., Disp: 90 tablet, Rfl: 1 .  letrozole (FEMARA) 2.5 MG tablet, Take 1 tablet (2.5 mg total) by mouth daily., Disp: 90 tablet, Rfl: 3 .  lisinopril-hydrochlorothiazide (ZESTORETIC) 20-12.5 MG tablet, Take 1 tablet by mouth daily., Disp: 90 tablet, Rfl: 3 .  Melatonin 10 MG TABS, Take 30 mg by mouth at bedtime., Disp: , Rfl:  .  naproxen (NAPROSYN) 375 MG  tablet, , Disp: , Rfl:  .  naproxen sodium (ALEVE) 220 MG tablet, Take 1 tablet (220 mg total) by mouth 2 (two) times daily as needed., Disp: 60 tablet, Rfl: 1 .  risperiDONE (RISPERDAL) 3 MG tablet, Take 0.5 tablets (1.5 mg total) by mouth 2 (two) times daily., Disp: 90 tablet, Rfl: 1 .  traZODone (DESYREL) 50 MG tablet, Take 0.5-1 tablets (25-50 mg total) by mouth at bedtime as needed for sleep., Disp: 90 tablet, Rfl: 1  Current Facility-Administered Medications:  .  lidocaine (PF) (XYLOCAINE) 1 % injection 4 mL, 4 mL, Intradermal, Once, Sohil Timko, Dionne Bucy, MD .  lidocaine (PF) (XYLOCAINE) 1 % injection 4 mL, 4 mL, Intradermal, Once, Khaza Blansett, Dionne Bucy, MD .  methylPREDNISolone acetate (DEPO-MEDROL) injection 40 mg, 40 mg, Intramuscular, Once, Raiford Fetterman, Dionne Bucy, MD .  methylPREDNISolone acetate (DEPO-MEDROL) injection 40 mg, 40 mg, Intramuscular, Once, Triston Skare, Dionne Bucy, MD  Review of Systems  Social History   Tobacco Use  . Smoking status: Never Smoker  . Smokeless tobacco: Never Used  Substance Use Topics  . Alcohol use: Not Currently    Comment: alcohol dependence prior to 2007      Objective:   There were no vitals taken for this visit. There were no vitals filed for this visit.There is no height or weight on file to  calculate BMI.   Physical Exam Vitals reviewed.  Constitutional:      General: She is not in acute distress.    Appearance: Normal appearance. She is not diaphoretic.  HENT:     Head: Normocephalic and atraumatic.  Eyes:     General: No scleral icterus.    Conjunctiva/sclera: Conjunctivae normal.  Cardiovascular:     Rate and Rhythm: Normal rate and regular rhythm.     Pulses: Normal pulses.     Heart sounds: Normal heart sounds. No murmur.  Pulmonary:     Effort: Pulmonary effort is normal. No respiratory distress.     Breath sounds: Normal breath sounds. No wheezing or rhonchi.  Musculoskeletal:     Right lower leg: No edema.     Left  lower leg: No edema.     Comments: TTP over R trochanteric bursa persists.  TTP over L SI joint. Negative SLR bilaterally Strength and sensation in lower extremities is grossly intact.  No midline spinous process tenderness to palpation or paraspinal musculature spasm or tenderness.  Skin:    General: Skin is warm and dry.     Findings: No rash.  Neurological:     Mental Status: She is alert and oriented to person, place, and time. Mental status is at baseline.     Gait: Gait abnormal (antalgic and slow).  Psychiatric:        Mood and Affect: Mood normal.        Behavior: Behavior normal.      No results found for any visits on 11/22/19.     Assessment & Plan    1. Greater trochanteric bursitis of right hip -Ongoing issue -She does still have point tenderness over her right greater trochanteric bursa -She has had 2 corticosteroid injections into this bursa with some relief, but not full relief -She has been taking NSAIDs without relief -She has been doing physical therapy, which we will continue -We will x-ray her hip to ensure no intra-articular issues, though she is denying any groin pain -Gabapentin and prednisone for sciatica may help with this as well -We will also refer to orthopedics for further evaluation and management - Ambulatory referral to Orthopedic Surgery - DG Lumbar Spine Complete; Future - DG Hip Unilat W OR W/O Pelvis 2-3 Views Left; Future - DG Hip Unilat W OR W/O Pelvis 2-3 Views Right; Future  2. Sciatica, left side -New problem within the last month -Her perceived weakness of the left leg and occasional numbness as well as the radiation down her left leg and in her left SI joint are new -She is neuro intact on exam with no red flag signs -We will obtain x-rays -Start gabapentin for radicular pain-we will start low-dose given that she is already on some sedating medications and did warn about sedation risk -Prednisone burst and taper -Do not take NSAIDs  with prednisone -Continue physical therapy -Referral to orthopedics for further evaluation and management - Ambulatory referral to Orthopedic Surgery - DG Lumbar Spine Complete; Future - DG Hip Unilat W OR W/O Pelvis 2-3 Views Left; Future - DG Hip Unilat W OR W/O Pelvis 2-3 Views Right; Future    Meds ordered this encounter  Medications  . gabapentin (NEURONTIN) 100 MG capsule    Sig: Take 1 capsule (100 mg total) by mouth 3 (three) times daily.    Dispense:  90 capsule    Refill:  3  . predniSONE (DELTASONE) 20 MG tablet    Sig: Take 60mg   PO daily x 2 days, then40mg  PO daily x 2 days, then 20mg  PO daily x 3 days    Dispense:  13 tablet    Refill:  0     Return if symptoms worsen or fail to improve.   The entirety of the information documented in the History of Present Illness, Review of Systems and Physical Exam were personally obtained by me. Portions of this information were initially documented by Johny Shock, CMA and reviewed by me for thoroughness and accuracy.    Brayam Boeke, Dionne Bucy, MD MPH [facility]

## 2019-11-22 NOTE — Patient Instructions (Signed)
Sciatica  Sciatica is pain, numbness, weakness, or tingling along the path of the sciatic nerve. The sciatic nerve starts in the lower back and runs down the back of each leg. The nerve controls the muscles in the lower leg and in the back of the knee. It also provides feeling (sensation) to the back of the thigh, the lower leg, and the sole of the foot. Sciatica is a symptom of another medical condition that pinches or puts pressure on the sciatic nerve. Sciatica most often only affects one side of the body. Sciatica usually goes away on its own or with treatment. In some cases, sciatica may come back (recur). What are the causes? This condition is caused by pressure on the sciatic nerve or pinching of the nerve. This may be the result of:  A disk in between the bones of the spine bulging out too far (herniated disk).  Age-related changes in the spinal disks.  A pain disorder that affects a muscle in the buttock.  Extra bone growth near the sciatic nerve.  A break (fracture) of the pelvis.  Pregnancy.  Tumor. This is rare. What increases the risk? The following factors may make you more likely to develop this condition:  Playing sports that place pressure or stress on the spine.  Having poor strength and flexibility.  A history of back injury or surgery.  Sitting for long periods of time.  Doing activities that involve repetitive bending or lifting.  Obesity. What are the signs or symptoms? Symptoms can vary from mild to very severe, and they may include:  Any of these problems in the lower back, leg, hip, or buttock: ? Mild tingling, numbness, or dull aches. ? Burning sensations. ? Sharp pains.  Numbness in the back of the calf or the sole of the foot.  Leg weakness.  Severe back pain that makes movement difficult. Symptoms may get worse when you cough, sneeze, or laugh, or when you sit or stand for long periods of time. How is this diagnosed? This condition may be  diagnosed based on:  Your symptoms and medical history.  A physical exam.  Blood tests.  Imaging tests, such as: ? X-rays. ? MRI. ? CT scan. How is this treated? In many cases, this condition improves on its own without treatment. However, treatment may include:  Reducing or modifying physical activity.  Exercising and stretching.  Icing and applying heat to the affected area.  Medicines that help to: ? Relieve pain and swelling. ? Relax your muscles.  Injections of medicines that help to relieve pain, irritation, and inflammation around the sciatic nerve (steroids).  Surgery. Follow these instructions at home: Medicines  Take over-the-counter and prescription medicines only as told by your health care provider.  Ask your health care provider if the medicine prescribed to you: ? Requires you to avoid driving or using heavy machinery. ? Can cause constipation. You may need to take these actions to prevent or treat constipation:  Drink enough fluid to keep your urine pale yellow.  Take over-the-counter or prescription medicines.  Eat foods that are high in fiber, such as beans, whole grains, and fresh fruits and vegetables.  Limit foods that are high in fat and processed sugars, such as fried or sweet foods. Managing pain      If directed, put ice on the affected area. ? Put ice in a plastic bag. ? Place a towel between your skin and the bag. ? Leave the ice on for 20 minutes,   2-3 times a day.  If directed, apply heat to the affected area. Use the heat source that your health care provider recommends, such as a moist heat pack or a heating pad. ? Place a towel between your skin and the heat source. ? Leave the heat on for 20-30 minutes. ? Remove the heat if your skin turns bright red. This is especially important if you are unable to feel pain, heat, or cold. You may have a greater risk of getting burned. Activity   Return to your normal activities as told  by your health care provider. Ask your health care provider what activities are safe for you.  Avoid activities that make your symptoms worse.  Take brief periods of rest throughout the day. ? When you rest for longer periods, mix in some mild activity or stretching between periods of rest. This will help to prevent stiffness and pain. ? Avoid sitting for long periods of time without moving. Get up and move around at least one time each hour.  Exercise and stretch regularly, as told by your health care provider.  Do not lift anything that is heavier than 10 lb (4.5 kg) while you have symptoms of sciatica. When you do not have symptoms, you should still avoid heavy lifting, especially repetitive heavy lifting.  When you lift objects, always use proper lifting technique, which includes: ? Bending your knees. ? Keeping the load close to your body. ? Avoiding twisting. General instructions  Maintain a healthy weight. Excess weight puts extra stress on your back.  Wear supportive, comfortable shoes. Avoid wearing high heels.  Avoid sleeping on a mattress that is too soft or too hard. A mattress that is firm enough to support your back when you sleep may help to reduce your pain.  Keep all follow-up visits as told by your health care provider. This is important. Contact a health care provider if:  You have pain that: ? Wakes you up when you are sleeping. ? Gets worse when you lie down. ? Is worse than you have experienced in the past. ? Lasts longer than 4 weeks.  You have an unexplained weight loss. Get help right away if:  You are not able to control when you urinate or have bowel movements (incontinence).  You have: ? Weakness in your lower back, pelvis, buttocks, or legs that gets worse. ? Redness or swelling of your back. ? A burning sensation when you urinate. Summary  Sciatica is pain, numbness, weakness, or tingling along the path of the sciatic nerve.  This condition  is caused by pressure on the sciatic nerve or pinching of the nerve.  Sciatica can cause pain, numbness, or tingling in the lower back, legs, hips, and buttocks.  Treatment often includes rest, exercise, medicines, and applying ice or heat. This information is not intended to replace advice given to you by your health care provider. Make sure you discuss any questions you have with your health care provider. Document Revised: 11/07/2018 Document Reviewed: 11/07/2018 Elsevier Patient Education  2020 [employer]  

## 2019-11-22 NOTE — Telephone Encounter (Signed)
Patient advised as below. Patient verbalizes understanding and is in agreement with treatment plan.  

## 2019-11-22 NOTE — Telephone Encounter (Signed)
-----   Message from Virginia Crews, MD sent at 11/22/2019 11:33 AM EST ----- All 3 x-ray results were reviewed.  They show degenerative changes of both hips and low back with no acute changes.  No changes to the plan as we discussed during her office visit.  They also show constipation, so I do recommend taking a stool softener or MiraLAX for this.

## 2019-11-23 ENCOUNTER — Telehealth: Payer: Self-pay

## 2019-11-23 ENCOUNTER — Ambulatory Visit: Payer: Medicare PPO

## 2019-11-23 ENCOUNTER — Ambulatory Visit (INDEPENDENT_AMBULATORY_CARE_PROVIDER_SITE_OTHER): Payer: Medicare PPO | Admitting: *Deleted

## 2019-11-23 DIAGNOSIS — R262 Difficulty in walking, not elsewhere classified: Secondary | ICD-10-CM

## 2019-11-23 DIAGNOSIS — M25551 Pain in right hip: Secondary | ICD-10-CM | POA: Diagnosis not present

## 2019-11-23 DIAGNOSIS — W19XXXA Unspecified fall, initial encounter: Secondary | ICD-10-CM | POA: Diagnosis not present

## 2019-11-23 DIAGNOSIS — K5900 Constipation, unspecified: Secondary | ICD-10-CM | POA: Diagnosis not present

## 2019-11-23 DIAGNOSIS — R2689 Other abnormalities of gait and mobility: Secondary | ICD-10-CM

## 2019-11-23 DIAGNOSIS — M25552 Pain in left hip: Secondary | ICD-10-CM

## 2019-11-23 DIAGNOSIS — T887XXA Unspecified adverse effect of drug or medicament, initial encounter: Secondary | ICD-10-CM | POA: Diagnosis not present

## 2019-11-23 NOTE — Therapy (Signed)
[facility] [address], [place], [address] Phone: [phone]   Fax:  [phone]  Physical Therapy Treatment  Patient Details  Name: Kendra [name] MRN: [id number] Date of Birth: Dec 03, 1945 No data recorded  Encounter Date: 11/23/2019  PT End of Session - 11/23/19 1420    Visit Number  17    Number of Visits  22    Date for PT Re-Evaluation  12/07/19    Authorization Type  7 / 10    PT Start Time  1302    PT Stop Time  1349    PT Time Calculation (min)  47 min    Activity Tolerance  Patient limited by fatigue    Behavior During Therapy  Anxious       Past Medical History:  Diagnosis Date  . Anxiety   . Breast cancer ([place]) 03/2018   right breast cancer at 11:00 and 1:00  . Bursitis    bilateral hips and knees  . Complication of anesthesia   . Depression   . Family history of breast cancer 03/24/2018  . Family history of lung cancer 03/24/2018  . History of alcohol dependence ([place]) 2007   no ETOH; resolved since 2007  . History of hiatal hernia   . History of psychosis 2014   due to sleep disturbance  . Hypertension   . Personal history of radiation therapy 06/2018-07/2018   right breast ca  . PONV (postoperative nausea and vomiting)     Past Surgical History:  Procedure Laterality Date  . BREAST BIOPSY Right 03/14/2018   11:00 DCIS and invasive ductal carcinoma  . BREAST BIOPSY Right 03/14/2018   1:00 Invasive ductal carcinoma  . BREAST LUMPECTOMY Right 04/27/2018   lumpectomy of 11 and 1:00 cancers, clear margins, negative LN  . BREAST LUMPECTOMY WITH RADIOACTIVE SEED AND SENTINEL LYMPH NODE BIOPSY Right 04/27/2018   Procedure: RIGHT BREAST LUMPECTOMY WITH RADIOACTIVE SEED X 2 AND RIGHT SENTINEL LYMPH NODE BIOPSY;  Surgeon: [name], MD;  Location: [facility];  Service: General;  Laterality: Right;  . CATARACT EXTRACTION W/PHACO Left 08/30/2019   Procedure:  CATARACT EXTRACTION PHACO AND INTRAOCULAR LENS PLACEMENT ([place]) LEFT panoptix toric  01:20.5  12.7%  10.26;  Surgeon: Leandrew Koyanagi, MD;  Location: [facility];  Service: Ophthalmology;  Laterality: Left;  . CATARACT EXTRACTION W/PHACO Right 09/20/2019   Procedure: CATARACT EXTRACTION PHACO AND INTRAOCULAR LENS PLACEMENT ([facility]) RIGHT 5.71  00:36.4  15.7%;  Surgeon: Leandrew Koyanagi, MD;  Location: [facility];  Service: Ophthalmology;  Laterality: Right;  . LAPAROTOMY N/A 02/10/2018   Procedure: EXPLORATORY LAPAROTOMY;  Surgeon: Isabel Caprice, MD;  Location: [facility];  Service: Gynecology;  Laterality: N/A;  . MASS EXCISION  01/2018   abdominal  . OVARIAN CYST REMOVAL    . SALPINGOOPHORECTOMY Bilateral 02/10/2018   Procedure: BILATERAL SALPINGO OOPHORECTOMY; PERITONEAL WASHINGS;  Surgeon: Isabel Caprice, MD;  Location: [facility];  Service: Gynecology;  Laterality: Bilateral;  . TONSILLECTOMY    . TONSILLECTOMY AND ADENOIDECTOMY  1953    There were no vitals filed for this visit.  Subjective Assessment - 11/23/19 1303    Subjective  Patient reports going to MD yesterday and getting imaging. Reports dx of "degeneration of hip and disc in back". Reports change of medication (gabapentin and prednisone). 0/10 pain now but 7/10 pain with movement    Pertinent History  Patient report unclear regarding onset. Visited MD on Sept 16  and received injection with relief for approximately 1 month. Reports she cannot receive an additional injection pending eye surgery. Received dx from MD of hip bursitis in B hips. Reports pain with walking, stanindg, sitting 5/10 current 7/10 worst 3/10 best, with temporary relief when she is sitting "just right" and with naproxen. Agg: activity. Cormorbidities: hx of breast cancer, HTN, MCI, insomnia, cataracts    Limitations  Sitting;Standing;Walking;House hold activities    How long can you sit comfortably?  Pain constant, reduced with neutral pelvic  tilt    How long can you stand comfortably?  Pain constant, increases with time    How long can you walk comfortably?  Pain constant, increases with time    Patient Stated Goals  Be able to move without hurting, walk 1 hour    Currently in Pain?  No/denies    Pain Score  7     Pain Location  Hip    Pain Orientation  Right;Left    Pain Descriptors / Indicators  Aching;Sore    Pain Type  Chronic pain    Pain Onset  More than a month ago       [facility] PT Assessment - 11/23/19 0001      Home Environment   Living Environment  --         TREATMENT  Assessment: SLR, slump, and modified prone knee bend (with pt in sidelying) special tests performed; interpretation of tests complicated by unclear pt report regarding symptoms. Symptoms consistent with radiculopathy or peripheral nerve entrapment cannot be ruled out at this time.  MT  L hip indirect distraction in multiple angles of abduction, flexion x 50 oscillations L hip lateral distraction x 30 oscillations L hip posterior mobilization grade 3 x 20   MT for pain relief   TE  Hip kickers abd/ext R/L 2 x 10 against green theraband -> red theraband due to challenge; compensations evident with abd but pt able to correct with cues Step ups 8" step with cues for full extension x 10 R/L Lateral step ups 6" step with cues for full extension x 10 R/L  TE for BLE hip strengthening  Education provided regarding pt's recent MD visit with imaging showing "degenerative changes" of hips and lumbar spine; educated pt regarding the imperfect relationship between imaging findings and pain and ubiquity of degenerative changes, especially in lumbar spine, in asymptomatic individuals. Education provided regarding PT POC and necessity of continuing with therapeutic exercises to support joints    PT Education - 11/23/19 1311    Education provided  Yes    Education Details  form/technique with exercise; contextualization of "degenerative changes"  reported by imaging    Person(s) Educated  Patient    Methods  Explanation;Demonstration;Tactile cues;Verbal cues    Comprehension  Verbalized understanding;Returned demonstration;Verbal cues required;Tactile cues required       PT Short Term Goals - 09/12/19 1419      PT SHORT TERM GOAL #1   Title  Patient will demonstrate complaince with HEP to speed recovery and reduce total number of visits.    Baseline  HEP given    Time  2    Period  Weeks    Status  New    Target Date  09/27/19        PT Long Term Goals - 11/09/19 1801      PT LONG TERM GOAL #1   Title  Patient will improve hip strength to 4/5 grossly to facilitate improved mobility with ADLs.  Baseline  3, 4-; 12/22 deferred to NV; 11/09/2019: 4-/5    Time  8    Period  Weeks    Status  On-going      PT LONG TERM GOAL #2   Title  Patient will demonstrate walking tolerance increased to 30 minutes without increase in symptoms to demonstrate improved ability to perform walking program for cardiovascular health.    Baseline  10 minute tolerance (patient report); 12/22 10 minutes (per pt report); 11/09/2019: 7 min    Time  8    Period  Weeks    Status  On-going      PT LONG TERM GOAL #3   Title  Patient will report worst pain of 3/10 during therapy to demonstrate reduced disability    Baseline  Worst 7/10; 12/22 worst 9/10; 11/09/2019: 9/10    Time  8    Period  Weeks    Status  On-going      PT LONG TERM GOAL #4   Title  Patient will demonstrate ability to perform all bed mobility maneuvers without increase in pain for reduced disability with mobility and transfers with ADLs.    Baseline  Reports pain with bed mobility supine <> sidelying L/R, sidelying to sitting EOB; 12/22 pt able to perform I without increased pain    Time  8    Period  Weeks    Status  Achieved      PT LONG TERM GOAL #5   Title  Patient will improve 10MW speed to 1.0 m/sec to meet norms for minimal fall risk and full community ambulation.     Baseline  0.88 m/sec; 12/22 0.96 m/sec; 11/09/2019: deferred    Time  8    Period  Weeks    Status  On-going      PT LONG TERM GOAL #6   Title  Patient will increase 6MWT by 200 ft to demonstrate improvement in functional capacity for transition to cardiac walking program    Baseline  6MWT = 1195; 1/7/20201: deferred    Time  8    Period  Weeks    Status  On-going            Plan - 11/23/19 1402    Clinical Impression Statement  Reassessed for lumbar radicular contribution to symptoms; response to special tests ambiguous influenced by recent medication change and possible patient fatigue following imaging visit yesterday; lumbar radiculopathy or peripheral nerve entrapment cannot be ruled out at this time. Note increased response to tests on LLE. Recent imaging demonstrates mild degenerative changes to hips R>L and lumbar spine. Note weakness with hip kickers exercise as pt cannot perform against green theraband resistance. Plan to integrate mobilizations and targeted strengthening for improved BLE hip function. Patient will benefit from skilled physical therapy to reduce pain with walking and improve activity tolerance.    Personal Factors and Comorbidities  Age;Comorbidity 3+;Time since onset of injury/illness/exacerbation    Comorbidities  MCI, HTN, CA, cataracts    Examination-Activity Limitations  Bend;Sit;Squat;Stairs;Stand;Sleep    Examination-Participation Restrictions  Cleaning;Community Activity    Stability/Clinical Decision Making  Evolving/Moderate complexity    Rehab Potential  Fair    Clinical Impairments Affecting Rehab Potential  MCI    PT Frequency  2x / week   Eval and 1 f/u visit   PT Treatment/Interventions  ADLs/Self Care Home Management;Therapeutic exercise;Patient/family education;Neuromuscular re-education;Therapeutic activities;Functional mobility training;Balance training;Manual techniques;Gait training;Stair training;Moist Heat;Electrical  Stimulation;Cryotherapy;Taping;Dry needling;Energy conservation;Passive range of motion;Joint Manipulations    PT  Next Visit Plan  Repeat assessment for neuro contribution; progress HEP    PT Home Exercise Plan  lateral step ups, step ups, hip kickers ext/abd red theraband    Consulted and Agree with Plan of Care  Patient       Patient will benefit from skilled therapeutic intervention in order to improve the following deficits and impairments:  Decreased range of motion, Postural dysfunction, Pain, Abnormal gait, Decreased activity tolerance, Decreased coordination, Decreased endurance, Decreased balance, Decreased mobility, Difficulty walking, Hypomobility, Increased muscle spasms, Impaired perceived functional ability, Decreased strength, Impaired flexibility  Visit Diagnosis: Pain in left hip  Pain in right hip  Difficulty in walking, not elsewhere classified     Problem List Patient Active Problem List   Diagnosis Date Noted  . Bipolar disorder, in full remission, most recent episode depressed ([place]) 11/08/2019  . Avitaminosis D 08/24/2019  . Insomnia due to mental condition 05/31/2019  . Bipolar I disorder, most recent episode depressed ([place]) 05/31/2019  . Mild cognitive disorder 05/31/2019  . Alcohol use disorder, moderate, in sustained remission ([place]) 05/31/2019  . Elevated tumor markers 05/27/2019  . GAD (generalized anxiety disorder) 04/10/2019  . Altered mental status 04/10/2019  . MDD (major depressive disorder), severe ([place]) 01/31/2019  . High serum carbohydrate antigen 19-9 (CA19-9) 12/01/2018  . Adjustment disorder with anxious mood 11/24/2018  . Essential hypertension 11/24/2018  . Screening for lipid disorders 11/24/2018  . B12 deficiency 10/17/2018  . Incisional hernia, without obstruction or gangrene 09/12/2018  . Genetic testing 04/07/2018  . Family history of breast cancer 03/24/2018  . Family history of lung cancer 03/24/2018  . Malignant neoplasm of  upper-outer quadrant of right breast in female, estrogen receptor positive ([place]) 03/17/2018  . Malignant neoplasm of upper-inner quadrant of right breast in female, estrogen receptor positive ([place]) 03/17/2018  . History of alcohol dependence ([place]) 02/07/2018  . History of psychosis 02/07/2018  . History of insomnia 02/07/2018    Virgia Land, SPT 11/23/2019, 2:23 PM  [facility] [address], [place], [address] Phone: [phone]   Fax:  [phone]  Name: Mayleigh Halsey MRN: [id number] Date of Birth: Oct 19, 1946

## 2019-11-23 NOTE — Telephone Encounter (Signed)
Patient advised of x-ray report.

## 2019-11-23 NOTE — Telephone Encounter (Signed)
Copied from [place] [phone]. Topic: General - Other >> Nov 23, 2019 11:18 AM Yvette Rack wrote: Reason for CRM: Pt called for imaging results. Pt requests call back to go over imaging results.

## 2019-11-23 NOTE — Telephone Encounter (Signed)
  Pt calling with having what she feels like is an adverse reaction to her medications. She just started on gabapentin and prednisone today and only had one dose of each. She is feeling off balance and fell this afternoon. She had gone for her walk and was coming back and started and couldn't stop herself and fell. She thinks it is the prednisone. Notified the [facility] for recommendation with her medication. Per protocol, she needs to speak with her provider. Will route to the practice for a call back in the morning. She is advised to hold off taking the next dose of prednisone until she speaks with her provider. She voiced understanding.   Reason for Disposition . [1] Caller has URGENT medication question about med that PCP or specialist prescribed AND [2] triager unable to answer question  Answer Assessment - Initial Assessment Questions 1.   NAME of MEDICATION: "What medicine are you calling about?"     prednisone 2.   QUESTION: "What is your question?"     Should I stop taking it 3.   PRESCRIBING HCP: "Who prescribed it?" Reason: if prescribed by specialist, call should be referred to that group.     Dr. Brita Romp 4. SYMPTOMS: "Do you have any symptoms?"     Feeling off balance 5. SEVERITY: If symptoms are present, ask "Are they mild, moderate or severe?"     Moderate, has fallen today 6.  PREGNANCY:  "Is there any chance that you are pregnant?" "When was your last menstrual period?"     n/a  Protocols used: MEDICATION QUESTION CALL-A-AH

## 2019-11-24 NOTE — Telephone Encounter (Signed)
Please advise 

## 2019-11-24 NOTE — Telephone Encounter (Addendum)
Virtual Visit via Telephone Note  I connected with Deseree Zemaitis Cox on 11/24/2019  At 9:50am  by telephone and verified that I am speaking with the correct person using two identifiers.  Location: Patient location: home Provider location: [facility] Persons involved in the visit: patient, provider   I discussed the limitations, risks, security and privacy concerns of performing an evaluation and management service by telephone and the availability of in person appointments. I also discussed with the patient that there may be a patient responsible charge related to this service. The patient expressed understanding and agreed to proceed.   History of Present Illness, A&P:  Called patient to discuss this.  She reports mechanical fall yesterday from standing after feeling imbalanced.  She is bruised over L side after hitting L hip and shoulder.  She does not feel as though she broke anything or needs to be evaluated after the fall.  She will see how she feels and consider f/u next week.  Discontinue gabapentin as this likely contributed to imbalance.  Continue prednisone as prescribed.  No changes to regular medication.  Hold Naproxen while taking prednisone.  May resume after finishing prednisone course.  No other NSAIDs while on prednisone or Naproxen.  Tylenol is ok.  She took Miralax and relieved constipation.  Advised to continue Miralax 1 cap-ful daily and titrate to one soft BM daily.  We can call back on Monday and check in on her again.  CMA, maybe we should go ahead and set telephone visit for Monday.    Follow Up Instructions: I discussed the assessment and treatment plan with the patient. The patient was provided an opportunity to ask questions and all were answered. The patient agreed with the plan and demonstrated an understanding of the instructions.   The patient was advised to call back or seek an in-person evaluation if the symptoms worsen or if the condition fails  to improve as anticipated.  I provided 15 minutes of non-face-to-face time during this encounter.  Heily Carlucci, Dionne Bucy, MD, MPH [facility]

## 2019-11-24 NOTE — Addendum Note (Signed)
Addended by: Virginia Crews on: 11/24/2019 10:06 AM   Modules accepted: Level of Service

## 2019-11-27 ENCOUNTER — Other Ambulatory Visit: Payer: Self-pay

## 2019-11-27 ENCOUNTER — Ambulatory Visit: Payer: Medicare PPO

## 2019-11-27 ENCOUNTER — Encounter: Payer: Self-pay | Admitting: Family Medicine

## 2019-11-27 ENCOUNTER — Ambulatory Visit (INDEPENDENT_AMBULATORY_CARE_PROVIDER_SITE_OTHER): Payer: Medicare PPO | Admitting: Family Medicine

## 2019-11-27 DIAGNOSIS — M25532 Pain in left wrist: Secondary | ICD-10-CM | POA: Diagnosis not present

## 2019-11-27 DIAGNOSIS — M25551 Pain in right hip: Secondary | ICD-10-CM | POA: Diagnosis not present

## 2019-11-27 DIAGNOSIS — M25562 Pain in left knee: Secondary | ICD-10-CM

## 2019-11-27 DIAGNOSIS — M25512 Pain in left shoulder: Secondary | ICD-10-CM

## 2019-11-27 DIAGNOSIS — W19XXXD Unspecified fall, subsequent encounter: Secondary | ICD-10-CM

## 2019-11-27 DIAGNOSIS — M25559 Pain in unspecified hip: Secondary | ICD-10-CM | POA: Diagnosis not present

## 2019-11-27 DIAGNOSIS — M25552 Pain in left hip: Secondary | ICD-10-CM | POA: Diagnosis not present

## 2019-11-27 DIAGNOSIS — R262 Difficulty in walking, not elsewhere classified: Secondary | ICD-10-CM

## 2019-11-27 DIAGNOSIS — M25522 Pain in left elbow: Secondary | ICD-10-CM | POA: Diagnosis not present

## 2019-11-27 NOTE — Progress Notes (Signed)
Patient: Kendra [name] Female    DOB: 1946/10/25   74 y.o.   MRN: [id number] Visit Date: 11/27/2019  Today's Provider: Lavon Paganini, MD   Chief Complaint  Patient presents with  . Follow-up   Subjective:    Virtual Visit via Telephone Note  I connected with Gervis Gaba Cox on 11/27/19 at 10:40 AM EST by telephone and verified that I am speaking with the correct person using two identifiers.  Location: Patient location: home Provider location: [facility] Persons involved in the visit: patient, provider   I discussed the limitations, risks, security and privacy concerns of performing an evaluation and management service by telephone and the availability of in person appointments. I also discussed with the patient that there may be a patient responsible charge related to this service. The patient expressed understanding and agreed to proceed.  HPI   Follow up for falls  The patient was last seen for this 4 days ago. Changes made at last visit include D/C gabapentin. Patient advised to continue prednisone.   She reports excellent compliance with treatment.  She feels that condition is unchanged. She is not having side effects. Dizziness seems to be better.  ------------------------------------------------------------------------------------ Mechanical fall on 11/22/19.  Fell onto L side from standing.  Bruises over L shoulder, as well as pain in elbow.  LWrist is popping. Bruises over L thigh and knee as well. More trouble bearing weight on L leg.  Does have chronic hip pain and was referred to Ortho. Reports that pain from hip bursitis seems to be like water falling into a bird bath with concentric circles radiating outward    Allergies  Allergen Reactions  . Hydroxyzine Anaphylaxis    Tongue swollen  . Shrimp [Shellfish Allergy] Other (See Comments)    Food poisoning      Current Outpatient Medications:  .  cyanocobalamin (,VITAMIN B-12,)  1000 MCG/ML injection, Inject 1 mL (1,000 mcg total) into the muscle every 30 (thirty) days., Disp: 3 mL, Rfl: 3 .  docusate sodium (COLACE) 100 MG capsule, Take 1 capsule (100 mg total) by mouth daily., Disp: 30 capsule, Rfl: 3 .  ERGOCALCIFEROL PO, Take 2,400 mg by mouth daily., Disp: , Rfl:  .  FLUoxetine HCl 60 MG TABS, Take 60 mg by mouth daily., Disp: 90 tablet, Rfl: 1 .  letrozole (FEMARA) 2.5 MG tablet, Take 1 tablet (2.5 mg total) by mouth daily., Disp: 90 tablet, Rfl: 3 .  lisinopril-hydrochlorothiazide (ZESTORETIC) 20-12.5 MG tablet, Take 1 tablet by mouth daily., Disp: 90 tablet, Rfl: 3 .  Melatonin 10 MG TABS, Take 30 mg by mouth at bedtime., Disp: , Rfl:  .  predniSONE (DELTASONE) 20 MG tablet, Take 60mg  PO daily x 2 days, then40mg  PO daily x 2 days, then 20mg  PO daily x 3 days, Disp: 13 tablet, Rfl: 0 .  risperiDONE (RISPERDAL) 3 MG tablet, Take 0.5 tablets (1.5 mg total) by mouth 2 (two) times daily., Disp: 90 tablet, Rfl: 1 .  traZODone (DESYREL) 50 MG tablet, Take 0.5-1 tablets (25-50 mg total) by mouth at bedtime as needed for sleep., Disp: 90 tablet, Rfl: 1 .  gabapentin (NEURONTIN) 100 MG capsule, Take 1 capsule (100 mg total) by mouth 3 (three) times daily. (Patient not taking: Reported on 11/27/2019), Disp: 90 capsule, Rfl: 3 .  naproxen (NAPROSYN) 375 MG tablet, , Disp: , Rfl:  .  naproxen sodium (ALEVE) 220 MG tablet, Take 1 tablet (220 mg total) by mouth 2 (two) times daily  as needed. (Patient not taking: Reported on 11/27/2019), Disp: 60 tablet, Rfl: 1  Current Facility-Administered Medications:  .  lidocaine (PF) (XYLOCAINE) 1 % injection 4 mL, 4 mL, Intradermal, Once, Momoka Stringfield, Dionne Bucy, MD .  lidocaine (PF) (XYLOCAINE) 1 % injection 4 mL, 4 mL, Intradermal, Once, Haynes Giannotti, Dionne Bucy, MD .  methylPREDNISolone acetate (DEPO-MEDROL) injection 40 mg, 40 mg, Intramuscular, Once, Abhi Moccia, Dionne Bucy, MD .  methylPREDNISolone acetate (DEPO-MEDROL) injection 40 mg, 40 mg,  Intramuscular, Once, Matei Magnone, Dionne Bucy, MD  Review of Systems  Constitutional: Negative.   Respiratory: Negative.   Cardiovascular: Negative.   Musculoskeletal: Positive for myalgias.    Social History   Tobacco Use  . Smoking status: Never Smoker  . Smokeless tobacco: Never Used  Substance Use Topics  . Alcohol use: Not Currently    Comment: alcohol dependence prior to 2007      Objective:   There were no vitals taken for this visit. There were no vitals filed for this visit.There is no height or weight on file to calculate BMI.   Physical Exam Speaking in full sentences in NAD  No results found for any visits on 11/27/19.     Assessment & Plan    Follow Up Instructions:  I discussed the assessment and treatment plan with the patient. The patient was provided an opportunity to ask questions and all were answered. The patient agreed with the plan and demonstrated an understanding of the instructions.   The patient was advised to call back or seek an in-person evaluation if the symptoms worsen or if the condition fails to improve as anticipated.  1. Hip pain 2. Acute pain of left knee 3. Acute pain of left shoulder 4. Left wrist pain 5. Left elbow pain 6. Fall, subsequent encounter - Fall on 1/21 from standing - seems to have been provoked by dizziness due to gabapentin - gabapentin has now been d/c'd and dizziness has improved - given ongoing pain with reported significant bruising, as well as more difficulty with weight bearing, will obtain XRays of affected joints - continue PT for strengthening and fall precautions/training - Referral to Ortho was placed last week for chronic hip pain - discussed return precautions  - DG Shoulder Left; Future - DG Elbow Complete Left; Future - DG Wrist Complete Left; Future - DG FEMUR 1V LEFT; Future - DG Knee Complete 4 Views Left; Future - DG Hip Unilat W OR W/O Pelvis 2-3 Views Left; Future    Return if symptoms  worsen or fail to improve.   The entirety of the information documented in the History of Present Illness, Review of Systems and Physical Exam were personally obtained by me. Portions of this information were initially documented by Lynford Humphrey, CMA and reviewed by me for thoroughness and accuracy.    Waylon Koffler, Dionne Bucy, MD MPH [facility]

## 2019-11-27 NOTE — Therapy (Signed)
[facility] [address], [place], [address] Phone: [phone]   Fax:  [phone]  Physical Therapy Treatment  Patient Details  Name: Kendra [name] MRN: [id number] Date of Birth: February 15, 1946 No data recorded  Encounter Date: 11/27/2019  PT End of Session - 11/27/19 1405    Visit Number  18    Number of Visits  22    Date for PT Re-Evaluation  12/07/19    Authorization Type  8 / 10    PT Start Time  1300    PT Stop Time  1345    PT Time Calculation (min)  45 min    Equipment Utilized During Treatment  --    Activity Tolerance  Patient limited by fatigue    Behavior During Therapy  Anxious       Past Medical History:  Diagnosis Date  . Anxiety   . Breast cancer ([place]) 03/2018   right breast cancer at 11:00 and 1:00  . Bursitis    bilateral hips and knees  . Complication of anesthesia   . Depression   . Family history of breast cancer 03/24/2018  . Family history of lung cancer 03/24/2018  . History of alcohol dependence ([place]) 2007   no ETOH; resolved since 2007  . History of hiatal hernia   . History of psychosis 2014   due to sleep disturbance  . Hypertension   . Personal history of radiation therapy 06/2018-07/2018   right breast ca  . PONV (postoperative nausea and vomiting)     Past Surgical History:  Procedure Laterality Date  . BREAST BIOPSY Right 03/14/2018   11:00 DCIS and invasive ductal carcinoma  . BREAST BIOPSY Right 03/14/2018   1:00 Invasive ductal carcinoma  . BREAST LUMPECTOMY Right 04/27/2018   lumpectomy of 11 and 1:00 cancers, clear margins, negative LN  . BREAST LUMPECTOMY WITH RADIOACTIVE SEED AND SENTINEL LYMPH NODE BIOPSY Right 04/27/2018   Procedure: RIGHT BREAST LUMPECTOMY WITH RADIOACTIVE SEED X 2 AND RIGHT SENTINEL LYMPH NODE BIOPSY;  Surgeon: [name], MD;  Location: [facility];  Service: General;  Laterality: Right;  . CATARACT EXTRACTION  W/PHACO Left 08/30/2019   Procedure: CATARACT EXTRACTION PHACO AND INTRAOCULAR LENS PLACEMENT ([place]) LEFT panoptix toric  01:20.5  12.7%  10.26;  Surgeon: Leandrew Koyanagi, MD;  Location: [facility];  Service: Ophthalmology;  Laterality: Left;  . CATARACT EXTRACTION W/PHACO Right 09/20/2019   Procedure: CATARACT EXTRACTION PHACO AND INTRAOCULAR LENS PLACEMENT ([facility]) RIGHT 5.71  00:36.4  15.7%;  Surgeon: Leandrew Koyanagi, MD;  Location: [facility];  Service: Ophthalmology;  Laterality: Right;  . LAPAROTOMY N/A 02/10/2018   Procedure: EXPLORATORY LAPAROTOMY;  Surgeon: Isabel Caprice, MD;  Location: [facility];  Service: Gynecology;  Laterality: N/A;  . MASS EXCISION  01/2018   abdominal  . OVARIAN CYST REMOVAL    . SALPINGOOPHORECTOMY Bilateral 02/10/2018   Procedure: BILATERAL SALPINGO OOPHORECTOMY; PERITONEAL WASHINGS;  Surgeon: Isabel Caprice, MD;  Location: [facility];  Service: Gynecology;  Laterality: Bilateral;  . TONSILLECTOMY    . TONSILLECTOMY AND ADENOIDECTOMY  1953    There were no vitals filed for this visit.  Subjective Assessment - 11/27/19 1320    Subjective  Patient states she experienced a fall after getting out of the car during Wednesday. Patient reports increased bruising along her L side in which she sustained the injury. Patient reports increased soreness but states she is able to stand on one  foot without a signifciant increase in pain and raise her arm without an singificant increase in pain. Patient reports she is having an X-Ray tomorrow to rule out fx.    Pertinent History  Patient report unclear regarding onset. Visited MD on Sept 16 and received injection with relief for approximately 1 month. Reports she cannot receive an additional injection pending eye surgery. Received dx from MD of hip bursitis in B hips. Reports pain with walking, stanindg, sitting 5/10 current 7/10 worst 3/10 best, with temporary relief when she is sitting "just right" and with  naproxen. Agg: activity. Cormorbidities: hx of breast cancer, HTN, MCI, insomnia, cataracts    Limitations  Sitting;Standing;Walking;House hold activities    How long can you sit comfortably?  Pain constant, reduced with neutral pelvic tilt    How long can you stand comfortably?  Pain constant, increases with time    How long can you walk comfortably?  Pain constant, increases with time    Patient Stated Goals  Be able to move without hurting, walk 1 hour    Currently in Pain?  Yes    Pain Score  7     Pain Location  Hip    Pain Orientation  Left    Pain Descriptors / Indicators  Aching    Pain Type  Chronic pain    Pain Onset  More than a month ago    Pain Frequency  Constant       TREATMENT TE Sit to stands - 2 x 10 with use of UEs Sideways walking - 2 x 45sec  LTRs with use of a physioball - x 20  Knees to chest with use of a physioball - x 20 Glute squeeze - x20 with patient positioned in supine Bridges - x 15 to improve hip strengthening Marches in standing - x 12 in standing with use of UE support Performed exercises to improve pain and strength   PT Education - 11/27/19 1402    Education provided  Yes    Education Details  Form/technique with exercise; POC    Person(s) Educated  Patient    Methods  Explanation;Demonstration    Comprehension  Verbalized understanding;Returned demonstration       PT Short Term Goals - 09/12/19 1419      PT SHORT TERM GOAL #1   Title  Patient will demonstrate complaince with HEP to speed recovery and reduce total number of visits.    Baseline  HEP given    Time  2    Period  Weeks    Status  New    Target Date  09/27/19        PT Long Term Goals - 11/09/19 1801      PT LONG TERM GOAL #1   Title  Patient will improve hip strength to 4/5 grossly to facilitate improved mobility with ADLs.    Baseline  3, 4-; 12/22 deferred to NV; 11/09/2019: 4-/5    Time  8    Period  Weeks    Status  On-going      PT LONG TERM GOAL #2    Title  Patient will demonstrate walking tolerance increased to 30 minutes without increase in symptoms to demonstrate improved ability to perform walking program for cardiovascular health.    Baseline  10 minute tolerance (patient report); 12/22 10 minutes (per pt report); 11/09/2019: 7 min    Time  8    Period  Weeks    Status  On-going  PT LONG TERM GOAL #3   Title  Patient will report worst pain of 3/10 during therapy to demonstrate reduced disability    Baseline  Worst 7/10; 12/22 worst 9/10; 11/09/2019: 9/10    Time  8    Period  Weeks    Status  On-going      PT LONG TERM GOAL #4   Title  Patient will demonstrate ability to perform all bed mobility maneuvers without increase in pain for reduced disability with mobility and transfers with ADLs.    Baseline  Reports pain with bed mobility supine <> sidelying L/R, sidelying to sitting EOB; 12/22 pt able to perform I without increased pain    Time  8    Period  Weeks    Status  Achieved      PT LONG TERM GOAL #5   Title  Patient will improve 10MW speed to 1.0 m/sec to meet norms for minimal fall risk and full community ambulation.    Baseline  0.88 m/sec; 12/22 0.96 m/sec; 11/09/2019: deferred    Time  8    Period  Weeks    Status  On-going      PT LONG TERM GOAL #6   Title  Patient will increase 6MWT by 200 ft to demonstrate improvement in functional capacity for transition to cardiac walking program    Baseline  6MWT = 1195; 1/7/20201: deferred    Time  8    Period  Weeks    Status  On-going            Plan - 11/27/19 1407    Clinical Impression Statement  Performed mobility based and light strengthening exercises as patient demonstrates increased overall soreness from a fall she sustained 4 days prior. Patient demonstrates B trendelenberg B which is worse on the L hip versus the right indicating poor hip strength and poor motor control along the glute med. Not able to adequately address this secondary to L LE soreness  from the sustaining the fall. Patient will benefit from further skilled therapy focused on improving limitations to return to prior level of function.    Personal Factors and Comorbidities  Age;Comorbidity 3+;Time since onset of injury/illness/exacerbation    Comorbidities  MCI, HTN, CA, cataracts    Examination-Activity Limitations  Bend;Sit;Squat;Stairs;Stand;Sleep    Examination-Participation Restrictions  Cleaning;Community Activity    Stability/Clinical Decision Making  Evolving/Moderate complexity    Rehab Potential  Fair    Clinical Impairments Affecting Rehab Potential  MCI    PT Frequency  2x / week   Eval and 1 f/u visit   PT Treatment/Interventions  ADLs/Self Care Home Management;Therapeutic exercise;Patient/family education;Neuromuscular re-education;Therapeutic activities;Functional mobility training;Balance training;Manual techniques;Gait training;Stair training;Moist Heat;Electrical Stimulation;Cryotherapy;Taping;Dry needling;Energy conservation;Passive range of motion;Joint Manipulations    PT Next Visit Plan  Repeat assessment for neuro contribution; progress HEP    PT Home Exercise Plan  lateral step ups, step ups, hip kickers ext/abd red theraband    Consulted and Agree with Plan of Care  Patient       Patient will benefit from skilled therapeutic intervention in order to improve the following deficits and impairments:  Decreased range of motion, Postural dysfunction, Pain, Abnormal gait, Decreased activity tolerance, Decreased coordination, Decreased endurance, Decreased balance, Decreased mobility, Difficulty walking, Hypomobility, Increased muscle spasms, Impaired perceived functional ability, Decreased strength, Impaired flexibility  Visit Diagnosis: Pain in left hip  Pain in right hip  Difficulty in walking, not elsewhere classified     Problem List Patient Active Problem  List   Diagnosis Date Noted  . Bipolar disorder, in full remission, most recent episode  depressed ([place]) 11/08/2019  . Avitaminosis D 08/24/2019  . Insomnia due to mental condition 05/31/2019  . Bipolar I disorder, most recent episode depressed ([place]) 05/31/2019  . Mild cognitive disorder 05/31/2019  . Alcohol use disorder, moderate, in sustained remission ([place]) 05/31/2019  . Elevated tumor markers 05/27/2019  . GAD (generalized anxiety disorder) 04/10/2019  . Altered mental status 04/10/2019  . MDD (major depressive disorder), severe ([place]) 01/31/2019  . High serum carbohydrate antigen 19-9 (CA19-9) 12/01/2018  . Adjustment disorder with anxious mood 11/24/2018  . Essential hypertension 11/24/2018  . Screening for lipid disorders 11/24/2018  . B12 deficiency 10/17/2018  . Incisional hernia, without obstruction or gangrene 09/12/2018  . Genetic testing 04/07/2018  . Family history of breast cancer 03/24/2018  . Family history of lung cancer 03/24/2018  . Malignant neoplasm of upper-outer quadrant of right breast in female, estrogen receptor positive ([place]) 03/17/2018  . Malignant neoplasm of upper-inner quadrant of right breast in female, estrogen receptor positive ([place]) 03/17/2018  . History of alcohol dependence ([place]) 02/07/2018  . History of psychosis 02/07/2018  . History of insomnia 02/07/2018    Blythe Stanford, PT DPT 11/27/2019, 2:15 PM  [facility] [address] [place], [address] Phone: [phone]   Fax:  [phone]  Name: Kendra [name] MRN: [id number] Date of Birth: 1946/01/11

## 2019-11-28 ENCOUNTER — Ambulatory Visit
Admission: RE | Admit: 2019-11-28 | Discharge: 2019-11-28 | Disposition: A | Discharge status: Home / Self Care | Payer: Medicare PPO | Source: Ambulatory Visit | Attending: Family Medicine | Admitting: Family Medicine

## 2019-11-28 ENCOUNTER — Ambulatory Visit
Admission: RE | Admit: 2019-11-28 | Discharge: 2019-11-28 | Disposition: A | Discharge status: Home / Self Care | Payer: Medicare PPO | Attending: Family Medicine | Admitting: Family Medicine

## 2019-11-28 DIAGNOSIS — M25562 Pain in left knee: Secondary | ICD-10-CM | POA: Insufficient documentation

## 2019-11-28 DIAGNOSIS — M25522 Pain in left elbow: Secondary | ICD-10-CM | POA: Diagnosis not present

## 2019-11-28 DIAGNOSIS — W19XXXD Unspecified fall, subsequent encounter: Secondary | ICD-10-CM | POA: Insufficient documentation

## 2019-11-28 DIAGNOSIS — M25532 Pain in left wrist: Secondary | ICD-10-CM | POA: Diagnosis not present

## 2019-11-28 DIAGNOSIS — M25559 Pain in unspecified hip: Secondary | ICD-10-CM | POA: Insufficient documentation

## 2019-11-28 DIAGNOSIS — S6992XA Unspecified injury of left wrist, hand and finger(s), initial encounter: Secondary | ICD-10-CM | POA: Diagnosis not present

## 2019-11-28 DIAGNOSIS — S8992XA Unspecified injury of left lower leg, initial encounter: Secondary | ICD-10-CM | POA: Diagnosis not present

## 2019-11-28 DIAGNOSIS — M19012 Primary osteoarthritis, left shoulder: Secondary | ICD-10-CM | POA: Insufficient documentation

## 2019-11-28 DIAGNOSIS — S52122A Displaced fracture of head of left radius, initial encounter for closed fracture: Secondary | ICD-10-CM | POA: Diagnosis not present

## 2019-11-28 DIAGNOSIS — M79652 Pain in left thigh: Secondary | ICD-10-CM | POA: Diagnosis not present

## 2019-11-28 DIAGNOSIS — M25512 Pain in left shoulder: Secondary | ICD-10-CM

## 2019-11-28 DIAGNOSIS — S79922A Unspecified injury of left thigh, initial encounter: Secondary | ICD-10-CM | POA: Diagnosis not present

## 2019-11-28 DIAGNOSIS — S52132A Displaced fracture of neck of left radius, initial encounter for closed fracture: Secondary | ICD-10-CM | POA: Diagnosis not present

## 2019-11-28 DIAGNOSIS — S79912A Unspecified injury of left hip, initial encounter: Secondary | ICD-10-CM | POA: Diagnosis not present

## 2019-11-29 ENCOUNTER — Telehealth: Payer: Self-pay

## 2019-11-29 DIAGNOSIS — M25522 Pain in left elbow: Secondary | ICD-10-CM

## 2019-11-29 NOTE — Telephone Encounter (Signed)
-----   Message from Virginia Crews, MD sent at 11/29/2019  8:29 AM EST ----- Other x-rays are normal with no new fractures and any other joint.  She does have an old healed fracture of her wrist.  She also has arthritis of her thumb and shoulder.  X-ray of her left elbow, however, shows a new fracture through the radial head.  She needs urgent referral to orthopedics.  She should avoid using this arm in the meantime.  Okay to place urgent referral if patient agrees.  If she has no preference, then I would recommend [facility].

## 2019-11-29 NOTE — Telephone Encounter (Signed)
Patient advised as below. Patient verbalizes understanding and is in agreement with treatment plan.  

## 2019-11-30 ENCOUNTER — Other Ambulatory Visit: Payer: Medicare Other

## 2019-11-30 ENCOUNTER — Ambulatory Visit: Payer: Medicare PPO

## 2019-11-30 ENCOUNTER — Ambulatory Visit: Payer: Medicare Other | Admitting: Hematology and Oncology

## 2019-11-30 DIAGNOSIS — S52122A Displaced fracture of head of left radius, initial encounter for closed fracture: Secondary | ICD-10-CM | POA: Diagnosis not present

## 2019-12-04 DIAGNOSIS — M7062 Trochanteric bursitis, left hip: Secondary | ICD-10-CM | POA: Diagnosis not present

## 2019-12-04 DIAGNOSIS — M545 Low back pain: Secondary | ICD-10-CM | POA: Diagnosis not present

## 2019-12-05 ENCOUNTER — Other Ambulatory Visit: Payer: Self-pay

## 2019-12-05 ENCOUNTER — Encounter: Payer: Self-pay | Admitting: Licensed Clinical Social Worker

## 2019-12-05 ENCOUNTER — Ambulatory Visit: Payer: Medicare PPO | Attending: Family Medicine

## 2019-12-05 ENCOUNTER — Ambulatory Visit (INDEPENDENT_AMBULATORY_CARE_PROVIDER_SITE_OTHER): Payer: Medicare PPO | Admitting: Licensed Clinical Social Worker

## 2019-12-05 DIAGNOSIS — M6281 Muscle weakness (generalized): Secondary | ICD-10-CM | POA: Diagnosis not present

## 2019-12-05 DIAGNOSIS — M25622 Stiffness of left elbow, not elsewhere classified: Secondary | ICD-10-CM | POA: Diagnosis not present

## 2019-12-05 DIAGNOSIS — M25522 Pain in left elbow: Secondary | ICD-10-CM | POA: Insufficient documentation

## 2019-12-05 DIAGNOSIS — M25551 Pain in right hip: Secondary | ICD-10-CM | POA: Diagnosis not present

## 2019-12-05 DIAGNOSIS — R262 Difficulty in walking, not elsewhere classified: Secondary | ICD-10-CM | POA: Diagnosis not present

## 2019-12-05 DIAGNOSIS — M25552 Pain in left hip: Secondary | ICD-10-CM

## 2019-12-05 DIAGNOSIS — F3176 Bipolar disorder, in full remission, most recent episode depressed: Secondary | ICD-10-CM

## 2019-12-05 NOTE — Progress Notes (Signed)
Virtual Visit via Video Note  I connected with [name] on 12/05/19 at  9:00 AM EST by a video enabled telemedicine application and verified that I am speaking with the correct person using two identifiers.   I discussed the limitations of evaluation and management by telemedicine and the availability of in person appointments. The patient expressed understanding and agreed to proceed    I discussed the assessment and treatment plan with the patient. The patient was provided an opportunity to ask questions and all were answered. The patient agreed with the plan and demonstrated an understanding of the instructions.   The patient was advised to call back or seek an in-person evaluation if the symptoms worsen or if the condition fails to improve as anticipated.  I provided 30 minutes of non-face-to-face time during this encounter.   Alden Hipp, LCSW    THERAPIST PROGRESS NOTE  Session Time: 0900  Participation Level: Active  Behavioral Response: NeatAlertAnxious  Type of Therapy: Individual Therapy  Treatment Goals addressed: Coping  Interventions: CBT  Summary: Latara Shelden is a 74 y.o. female who presents with continued symptoms related to her diagnosis. Sharnita reports doing well since our last session. She notes an increase in physical pain up and down her legs, which she is addressing with her medical doctor. She reports this has been difficult to manage at times, but is also going to physical therapy. LCSW validated Elandra's feelings and held space for her to vent her frustrations about her physical pain. We discussed the link between physical and mental health, and how it can be beneficial to recognize that out loud to allow yourself the space needed to manage the overlap. Anaaya expressed understanding and agreement. We discussed ways Charo could challenge negative thoughts associated with her physical health in order to improve her mental health in the process. Camil  expressed agreement with this idea as well. Kourtlynn reports she has started going to an art class, which she notes she has really enjoyed. We discussed expressive arts therapy, and how healing therapy can be for mental health.   Suicidal/Homicidal: No  Therapist Response: Angeles continues to work towards her tx goals but has not yet reached them. We will continue to work on improving emotional regulation and distress tolerance moving forward. We will also continue to work on improving CBT skills to manage anxiety symptoms/paranoid thoughts.   Plan: Return again in 2 weeks.  Diagnosis: Axis I: Bipolar Disorder    Axis II: No diagnosis    Alden Hipp, LCSW 12/05/2019

## 2019-12-06 NOTE — Therapy (Signed)
[facility] [address], [place], [address] Phone: [phone]   Fax:  [phone]  Physical Therapy Treatment  Patient Details  Name: Kendra [name] MRN: [id number] Date of Birth: 1946-06-05 No data recorded  Encounter Date: 12/05/2019  PT End of Session - 12/05/19 1152    Visit Number  19    Number of Visits  22    Date for PT Re-Evaluation  12/07/19    Authorization Type  9 / 10    PT Start Time  1030    PT Stop Time  1115    PT Time Calculation (min)  45 min    Activity Tolerance  Patient limited by fatigue    Behavior During Therapy  Anxious       Past Medical History:  Diagnosis Date  . Anxiety   . Breast cancer ([place]) 03/2018   right breast cancer at 11:00 and 1:00  . Bursitis    bilateral hips and knees  . Complication of anesthesia   . Depression   . Family history of breast cancer 03/24/2018  . Family history of lung cancer 03/24/2018  . History of alcohol dependence ([place]) 2007   no ETOH; resolved since 2007  . History of hiatal hernia   . History of psychosis 2014   due to sleep disturbance  . Hypertension   . Personal history of radiation therapy 06/2018-07/2018   right breast ca  . PONV (postoperative nausea and vomiting)     Past Surgical History:  Procedure Laterality Date  . BREAST BIOPSY Right 03/14/2018   11:00 DCIS and invasive ductal carcinoma  . BREAST BIOPSY Right 03/14/2018   1:00 Invasive ductal carcinoma  . BREAST LUMPECTOMY Right 04/27/2018   lumpectomy of 11 and 1:00 cancers, clear margins, negative LN  . BREAST LUMPECTOMY WITH RADIOACTIVE SEED AND SENTINEL LYMPH NODE BIOPSY Right 04/27/2018   Procedure: RIGHT BREAST LUMPECTOMY WITH RADIOACTIVE SEED X 2 AND RIGHT SENTINEL LYMPH NODE BIOPSY;  Surgeon: [name], MD;  Location: [facility];  Service: General;  Laterality: Right;  . CATARACT EXTRACTION W/PHACO Left 08/30/2019   Procedure:  CATARACT EXTRACTION PHACO AND INTRAOCULAR LENS PLACEMENT ([place]) LEFT panoptix toric  01:20.5  12.7%  10.26;  Surgeon: Leandrew Koyanagi, MD;  Location: [facility];  Service: Ophthalmology;  Laterality: Left;  . CATARACT EXTRACTION W/PHACO Right 09/20/2019   Procedure: CATARACT EXTRACTION PHACO AND INTRAOCULAR LENS PLACEMENT ([facility]) RIGHT 5.71  00:36.4  15.7%;  Surgeon: Leandrew Koyanagi, MD;  Location: [facility];  Service: Ophthalmology;  Laterality: Right;  . LAPAROTOMY N/A 02/10/2018   Procedure: EXPLORATORY LAPAROTOMY;  Surgeon: Isabel Caprice, MD;  Location: [facility];  Service: Gynecology;  Laterality: N/A;  . MASS EXCISION  01/2018   abdominal  . OVARIAN CYST REMOVAL    . SALPINGOOPHORECTOMY Bilateral 02/10/2018   Procedure: BILATERAL SALPINGO OOPHORECTOMY; PERITONEAL WASHINGS;  Surgeon: Isabel Caprice, MD;  [facility];  Service: Gynecology;  Laterality: Bilateral;  . TONSILLECTOMY    . TONSILLECTOMY AND ADENOIDECTOMY  1953    There were no vitals filed for this visit.  Subjective Assessment - 12/05/19 1131    Subjective  Patients states increased pain which she reports has been generalized and reports increased pain along the L side.    Pertinent History  Patient report unclear regarding onset. Visited MD on Sept 16 and received injection with relief for approximately 1 month. Reports she cannot receive an additional injection  pending eye surgery. Received dx from MD of hip bursitis in B hips. Reports pain with walking, stanindg, sitting 5/10 current 7/10 worst 3/10 best, with temporary relief when she is sitting "just right" and with naproxen. Agg: activity. Cormorbidities: hx of breast cancer, HTN, MCI, insomnia, cataracts    Limitations  Sitting;Standing;Walking;House hold activities    How long can you sit comfortably?  Pain constant, reduced with neutral pelvic tilt    How long can you stand comfortably?  Pain constant, increases with time    How long can  you walk comfortably?  Pain constant, increases with time    Patient Stated Goals  Be able to move without hurting, walk 1 hour    Currently in Pain?  Yes    Pain Score  7     Pain Location  Hip    Pain Orientation  Left    Pain Descriptors / Indicators  Aching    Pain Type  Chronic pain    Pain Onset  More than a month ago    Pain Frequency  Constant    Multiple Pain Sites  No       TREATMENT Therapeutic Exercise Seated Hip ER with RTB - 2 x 15 Sit to stands with RTB around knees - 2 x 10 Hip abduction with unilateral UE support - 2 x 10 SLS with B UE support - 2 x 10 Hip ER in SLS - 2 x 10 Hip extension with unilateral UE support - 2 x 10 Performed exercises to address hip strength and coordination    PT Education - 12/05/19 1152    Education provided  Yes    Education Details  form/technique with exercise    Person(s) Educated  Patient    Methods  Explanation;Demonstration    Comprehension  Returned demonstration;Verbalized understanding       PT Short Term Goals - 09/12/19 1419      PT SHORT TERM GOAL #1   Title  Patient will demonstrate complaince with HEP to speed recovery and reduce total number of visits.    Baseline  HEP given    Time  2    Period  Weeks    Status  New    Target Date  09/27/19        PT Long Term Goals - 11/09/19 1801      PT LONG TERM GOAL #1   Title  Patient will improve hip strength to 4/5 grossly to facilitate improved mobility with ADLs.    Baseline  3, 4-; 12/22 deferred to NV; 11/09/2019: 4-/5    Time  8    Period  Weeks    Status  On-going      PT LONG TERM GOAL #2   Title  Patient will demonstrate walking tolerance increased to 30 minutes without increase in symptoms to demonstrate improved ability to perform walking program for cardiovascular health.    Baseline  10 minute tolerance (patient report); 12/22 10 minutes (per pt report); 11/09/2019: 7 min    Time  8    Period  Weeks    Status  On-going      PT LONG TERM GOAL  #3   Title  Patient will report worst pain of 3/10 during therapy to demonstrate reduced disability    Baseline  Worst 7/10; 12/22 worst 9/10; 11/09/2019: 9/10    Time  8    Period  Weeks    Status  On-going      PT LONG TERM  GOAL #4   Title  Patient will demonstrate ability to perform all bed mobility maneuvers without increase in pain for reduced disability with mobility and transfers with ADLs.    Baseline  Reports pain with bed mobility supine <> sidelying L/R, sidelying to sitting EOB; 12/22 pt able to perform I without increased pain    Time  8    Period  Weeks    Status  Achieved      PT LONG TERM GOAL #5   Title  Patient will improve 10MW speed to 1.0 m/sec to meet norms for minimal fall risk and full community ambulation.    Baseline  0.88 m/sec; 12/22 0.96 m/sec; 11/09/2019: deferred    Time  8    Period  Weeks    Status  On-going      PT LONG TERM GOAL #6   Title  Patient will increase 6MWT by 200 ft to demonstrate improvement in functional capacity for transition to cardiac walking program    Baseline  6MWT = 1195; 1/7/20201: deferred    Time  8    Period  Weeks    Status  On-going            Plan - 12/06/19 1130    Clinical Impression Statement  Continued to work on addressing hip weakness in pain free positions during today's session. Patient has difficulty recalling previous symptoms, but does not reports any pain or increase in symptoms during the entirity of the session. Patient deomnstrates difficulty with single leg stance with a significant trendelberg and pt requires cueing to maintain a neutral pelvis level during the SLS. Little carryover from the previous session, however, I believe this is from the increased difficulty from the fall. Patient will benefit from furhter skilled therapy focused on improving these limitations to return to prior level of function.    Personal Factors and Comorbidities  Age;Comorbidity 3+;Time since onset of  injury/illness/exacerbation    Comorbidities  MCI, HTN, CA, cataracts    Examination-Activity Limitations  Bend;Sit;Squat;Stairs;Stand;Sleep    Examination-Participation Restrictions  Cleaning;Community Activity    Stability/Clinical Decision Making  Evolving/Moderate complexity    Rehab Potential  Fair    Clinical Impairments Affecting Rehab Potential  MCI    PT Frequency  2x / week   Eval and 1 f/u visit   PT Treatment/Interventions  ADLs/Self Care Home Management;Therapeutic exercise;Patient/family education;Neuromuscular re-education;Therapeutic activities;Functional mobility training;Balance training;Manual techniques;Gait training;Stair training;Moist Heat;Electrical Stimulation;Cryotherapy;Taping;Dry needling;Energy conservation;Passive range of motion;Joint Manipulations    PT Next Visit Plan  Repeat assessment for neuro contribution; progress HEP    PT Home Exercise Plan  lateral step ups, step ups, hip kickers ext/abd red theraband    Consulted and Agree with Plan of Care  Patient       Patient will benefit from skilled therapeutic intervention in order to improve the following deficits and impairments:  Decreased range of motion, Postural dysfunction, Pain, Abnormal gait, Decreased activity tolerance, Decreased coordination, Decreased endurance, Decreased balance, Decreased mobility, Difficulty walking, Hypomobility, Increased muscle spasms, Impaired perceived functional ability, Decreased strength, Impaired flexibility  Visit Diagnosis: Pain in left hip  Pain in right hip  Difficulty in walking, not elsewhere classified     Problem List Patient Active Problem List   Diagnosis Date Noted  . Bipolar disorder, in full remission, most recent episode depressed ([place]) 11/08/2019  . Avitaminosis D 08/24/2019  . Insomnia due to mental condition 05/31/2019  . Bipolar I disorder, most recent episode depressed ([facility]) 05/31/2019  .  Mild cognitive disorder 05/31/2019  . Alcohol use  disorder, moderate, in sustained remission ([facility]) 05/31/2019  . Elevated tumor markers 05/27/2019  . GAD (generalized anxiety disorder) 04/10/2019  . Altered mental status 04/10/2019  . MDD (major depressive disorder), severe ([place]) 01/31/2019  . High serum carbohydrate antigen 19-9 (CA19-9) 12/01/2018  . Adjustment disorder with anxious mood 11/24/2018  . Essential hypertension 11/24/2018  . Screening for lipid disorders 11/24/2018  . B12 deficiency 10/17/2018  . Incisional hernia, without obstruction or gangrene 09/12/2018  . Genetic testing 04/07/2018  . Family history of breast cancer 03/24/2018  . Family history of lung cancer 03/24/2018  . Malignant neoplasm of upper-outer quadrant of right breast in female, estrogen receptor positive ([place]) 03/17/2018  . Malignant neoplasm of upper-inner quadrant of right breast in female, estrogen receptor positive ([place]) 03/17/2018  . History of alcohol dependence ([place]) 02/07/2018  . History of psychosis 02/07/2018  . History of insomnia 02/07/2018    Blythe Stanford, PT DPT 12/06/2019, 11:37 AM  [facility] [address] [place], [address] Phone: [phone]   Fax:  [phone]  Name: Kendra [name] MRN: [id number] Date of Birth: 1946-04-11

## 2019-12-07 ENCOUNTER — Other Ambulatory Visit: Payer: Self-pay

## 2019-12-07 ENCOUNTER — Ambulatory Visit: Payer: Medicare PPO

## 2019-12-07 DIAGNOSIS — M25551 Pain in right hip: Secondary | ICD-10-CM

## 2019-12-07 DIAGNOSIS — M25522 Pain in left elbow: Secondary | ICD-10-CM | POA: Diagnosis not present

## 2019-12-07 DIAGNOSIS — M25622 Stiffness of left elbow, not elsewhere classified: Secondary | ICD-10-CM | POA: Diagnosis not present

## 2019-12-07 DIAGNOSIS — R262 Difficulty in walking, not elsewhere classified: Secondary | ICD-10-CM | POA: Diagnosis not present

## 2019-12-07 DIAGNOSIS — M25552 Pain in left hip: Secondary | ICD-10-CM | POA: Diagnosis not present

## 2019-12-07 DIAGNOSIS — M6281 Muscle weakness (generalized): Secondary | ICD-10-CM | POA: Diagnosis not present

## 2019-12-07 NOTE — Therapy (Addendum)
[facility] [address] [place], [address] Phone: [phone]   Fax:  [phone]  Physical Therapy Treatment/ Progress Note  Patient Details  Name: Kendra [name] MRN: [id number] Date of Birth: 02-Jul-1946 No data recorded  Encounter Date: 12/07/2019   Reporting Period 11/03/2019 - 12/07/2019  PT End of Session - 12/07/19 1306    Visit Number  20    Number of Visits  22    Date for PT Re-Evaluation  12/07/19    Authorization Type  10 / 10    PT Start Time  0900    PT Stop Time  0945    PT Time Calculation (min)  45 min    Equipment Utilized During Treatment  Gait belt    Activity Tolerance  Patient tolerated treatment well;No increased pain    Behavior During Therapy  Flat affect       Past Medical History:  Diagnosis Date  . Anxiety   . Breast cancer ([facility] [facility]) 03/2018   right breast cancer at 11:00 and 1:00  . Bursitis    bilateral hips and knees  . Complication of anesthesia   . Depression   . Family history of breast cancer 03/24/2018  . Family history of lung cancer 03/24/2018  . History of alcohol dependence ([facility]) 2007   no ETOH; resolved since 2007  . History of hiatal hernia   . History of psychosis 2014   due to sleep disturbance  . Hypertension   . Personal history of radiation therapy 06/2018-07/2018   right breast ca  . PONV (postoperative nausea and vomiting)     Past Surgical History:  Procedure Laterality Date  . BREAST BIOPSY Right 03/14/2018   11:00 DCIS and invasive ductal carcinoma  . BREAST BIOPSY Right 03/14/2018   1:00 Invasive ductal carcinoma  . BREAST LUMPECTOMY Right 04/27/2018   lumpectomy of 11 and 1:00 cancers, clear margins, negative LN  . BREAST LUMPECTOMY WITH RADIOACTIVE SEED AND SENTINEL LYMPH NODE BIOPSY Right 04/27/2018   Procedure: RIGHT BREAST LUMPECTOMY WITH RADIOACTIVE SEED X 2 AND RIGHT SENTINEL LYMPH NODE BIOPSY;  Surgeon: [name], MD;  Location:  [facility];  Service: General;  Laterality: Right;  . CATARACT EXTRACTION W/PHACO Left 08/30/2019   Procedure: CATARACT EXTRACTION PHACO AND INTRAOCULAR LENS PLACEMENT ([place]) LEFT panoptix toric  01:20.5  12.7%  10.26;  Surgeon: Leandrew Koyanagi, MD;  Location: [facility];  Service: Ophthalmology;  Laterality: Left;  . CATARACT EXTRACTION W/PHACO Right 09/20/2019   Procedure: CATARACT EXTRACTION PHACO AND INTRAOCULAR LENS PLACEMENT ([facility]) RIGHT 5.71  00:36.4  15.7%;  Surgeon: Leandrew Koyanagi, MD;  Location: [facility];  Service: Ophthalmology;  Laterality: Right;  . LAPAROTOMY N/A 02/10/2018   Procedure: EXPLORATORY LAPAROTOMY;  Surgeon: Isabel Caprice, MD;  [facility];  Service: Gynecology;  Laterality: N/A;  . MASS EXCISION  01/2018   abdominal  . OVARIAN CYST REMOVAL    . SALPINGOOPHORECTOMY Bilateral 02/10/2018   Procedure: BILATERAL SALPINGO OOPHORECTOMY; PERITONEAL WASHINGS;  Surgeon: Isabel Caprice, MD;  [facility];  Service: Gynecology;  Laterality: Bilateral;  . TONSILLECTOMY    . TONSILLECTOMY AND ADENOIDECTOMY  1953    There were no vitals filed for this visit.  Subjective Assessment - 12/07/19 0904    Subjective  Patient reports discovering more bruising along left side. She has not attempted walking due to icy streets. She reports recovering well from fall.    Pertinent History  Patient report unclear regarding onset. Visited MD on Sept 16 and received injection with relief for approximately 1 month. Reports she cannot receive an additional injection pending eye surgery. Received dx from MD of hip bursitis in B hips. Reports pain with walking, stanindg, sitting 5/10 current 7/10 worst 3/10 best, with temporary relief when she is sitting "just right" and with naproxen. Agg: activity. Cormorbidities: hx of breast cancer, HTN, MCI, insomnia, cataracts    Limitations  Sitting;Standing;Walking;House hold activities    How long can  you sit comfortably?  Pain constant, reduced with neutral pelvic tilt    How long can you stand comfortably?  Pain constant, increases with time    How long can you walk comfortably?  Pain constant, increases with time    Patient Stated Goals  Be able to move without hurting, walk 1 hour    Currently in Pain?  Yes    Pain Score  7     Pain Location  Hip    Pain Orientation  Left    Pain Descriptors / Indicators  Aching    Pain Type  Chronic pain    Pain Onset  More than a month ago    Multiple Pain Sites  No           TREATMENT   Reassessment of goals: see LTG section  TE Seated hip abd with red theraband 2 sec hold 2 x 10  Bridges x 10 with 2 sec hold - cues for full excursion  Stairs ascending/descending 4 steps x 8" with rails x 2, without rails x 2: note step-to descent without rails, reliance on RLE for eccentric descent Eccentric quad tap-downs on 4" step x 10 L - discontinued following pt complaint of knee pain Standing hip abd, ext x 10 each R/L with cues for pelvic stability for improved target muscle recruitment - pt inconsistent with corrections   PT Education - 12/07/19 1306    Education provided  Yes    Education Details  form/technique with exercise    Person(s) Educated  Patient    Methods  Explanation;Demonstration;Verbal cues;Tactile cues    Comprehension  Verbalized understanding;Returned demonstration;Verbal cues required;Tactile cues required       PT Short Term Goals - 09/12/19 1419      PT SHORT TERM GOAL #1   Title  Patient will demonstrate complaince with HEP to speed recovery and reduce total number of visits.    Baseline  HEP given    Time  2    Period  Weeks    Status  New    Target Date  09/27/19        PT Long Term Goals - 12/07/19 [id number]      PT LONG TERM GOAL #1   Title  Patient will improve hip strength to 4/5 grossly to facilitate improved mobility with ADLs.    Baseline  3, 4-; 12/22 deferred to NV; 11/09/2019: 4-/5; 12/07/2019 hip  flexor 4, abd and ext 3+    Time  8    Period  Weeks    Status  On-going    Target Date  01/18/20      PT LONG TERM GOAL #2   Title  Patient will demonstrate walking tolerance increased to 30 minutes without increase in symptoms to demonstrate improved ability to perform walking program for cardiovascular health.    Baseline  10 minute tolerance (patient report); 12/22 10 minutes (per pt report); 11/09/2019: 7 min    Time  8  Period  Weeks    Status  On-going    Target Date  01/18/20      PT LONG TERM GOAL #3   Title  Patient will report worst pain of 3/10 during therapy to demonstrate reduced disability    Baseline  Worst 7/10; 12/22 worst 9/10; 11/09/2019: 9/10; 12/07/2019 7/10    Time  8    Period  Weeks    Status  On-going    Target Date  01/18/20      PT LONG TERM GOAL #4   Title  Patient will demonstrate ability to perform all bed mobility maneuvers without increase in pain for reduced disability with mobility and transfers with ADLs.    Baseline  Reports pain with bed mobility supine <> sidelying L/R, sidelying to sitting EOB; 12/22 pt able to perform I without increased pain    Time  8    Period  Weeks    Status  Achieved    Target Date  01/18/20      PT LONG TERM GOAL #5   Title  Patient will improve 10MW speed to 1.0 m/sec to meet norms for minimal fall risk and full community ambulation.    Baseline  0.88 m/sec; 12/22 0.96 m/sec; 11/09/2019: deferred; 12/07/2019 0.90 m/sec    Time  8    Period  Weeks    Status  On-going    Target Date  01/18/20      PT LONG TERM GOAL #6   Title  Patient will increase 6MWT by 200 ft to demonstrate improvement in functional capacity for transition to cardiac walking program    Baseline  6MWT = 1195; 1/7/20201: deferred; 12/07/2019 1095    Time  8    Period  Weeks    Status  On-going    Target Date  01/18/20            Plan - 12/07/19 1308    Clinical Impression Statement  Reassessment: note minor decrement in 6MWT and 10MWT likely  attributable to fall. Note improvement in hip flexion strength but deficiencies remain in hip abduction and extension strength. Patient report of pain is unclear as pt reported pain often coming on after several minutes of walking but reported no pain following 6MWT. Note poor carryover of technical exercise corrections from prior sessions. Plan to investigate possible hip extension limitation and utilize targeted gluteal strengthening. Patient will benefit from skilled physical therapy to reduce pain and fall risk.    Personal Factors and Comorbidities  Age;Comorbidity 3+;Time since onset of injury/illness/exacerbation;Comorbidity 2    Comorbidities  MCI, HTN, CA, cataracts    Examination-Activity Limitations  Bend;Sit;Squat;Stairs;Stand;Sleep    Examination-Participation Restrictions  Cleaning;Community Activity    Stability/Clinical Decision Making  Evolving/Moderate complexity    Rehab Potential  Fair    Clinical Impairments Affecting Rehab Potential  MCI    PT Frequency  2x / week   Eval and 1 f/u visit   PT Treatment/Interventions  ADLs/Self Care Home Management;Therapeutic exercise;Patient/family education;Neuromuscular re-education;Therapeutic activities;Functional mobility training;Balance training;Manual techniques;Gait training;Stair training;Moist Heat;Electrical Stimulation;Cryotherapy;Taping;Dry needling;Energy conservation;Passive range of motion;Joint Manipulations    PT Next Visit Plan  Check hip extension limitation, low-tech glute therex    PT Home Exercise Plan  lateral step ups, step ups, hip kickers ext/abd red theraband    Consulted and Agree with Plan of Care  Patient       Patient will benefit from skilled therapeutic intervention in order to improve the following deficits and impairments:  Decreased  range of motion, Postural dysfunction, Pain, Abnormal gait, Decreased activity tolerance, Decreased coordination, Decreased endurance, Decreased balance, Decreased mobility,  Difficulty walking, Hypomobility, Increased muscle spasms, Impaired perceived functional ability, Decreased strength, Impaired flexibility  Visit Diagnosis: Pain in left hip  Pain in right hip  Difficulty in walking, not elsewhere classified     Problem List Patient Active Problem List   Diagnosis Date Noted  . Bipolar disorder, in full remission, most recent episode depressed ([place]) 11/08/2019  . Avitaminosis D 08/24/2019  . Insomnia due to mental condition 05/31/2019  . Bipolar I disorder, most recent episode depressed ([place]) 05/31/2019  . Mild cognitive disorder 05/31/2019  . Alcohol use disorder, moderate, in sustained remission ([facility]) 05/31/2019  . Elevated tumor markers 05/27/2019  . GAD (generalized anxiety disorder) 04/10/2019  . Altered mental status 04/10/2019  . MDD (major depressive disorder), severe ([place]) 01/31/2019  . High serum carbohydrate antigen 19-9 (CA19-9) 12/01/2018  . Adjustment disorder with anxious mood 11/24/2018  . Essential hypertension 11/24/2018  . Screening for lipid disorders 11/24/2018  . B12 deficiency 10/17/2018  . Incisional hernia, without obstruction or gangrene 09/12/2018  . Genetic testing 04/07/2018  . Family history of breast cancer 03/24/2018  . Family history of lung cancer 03/24/2018  . Malignant neoplasm of upper-outer quadrant of right breast in female, estrogen receptor positive ([place]) 03/17/2018  . Malignant neoplasm of upper-inner quadrant of right breast in female, estrogen receptor positive ([place]) 03/17/2018  . History of alcohol dependence ([place]) 02/07/2018  . History of psychosis 02/07/2018  . History of insomnia 02/07/2018    Virgia Land, SPT 12/07/2019, 1:17 PM  [facility] [address], [place], [address] Phone: [phone]   Fax:  [phone]  Name: Kendra [name] MRN: [id number] Date of Birth: 12-Sep-1946

## 2019-12-11 ENCOUNTER — Other Ambulatory Visit: Payer: Self-pay

## 2019-12-11 ENCOUNTER — Ambulatory Visit: Payer: Medicare PPO

## 2019-12-11 DIAGNOSIS — M25552 Pain in left hip: Secondary | ICD-10-CM

## 2019-12-11 DIAGNOSIS — M6281 Muscle weakness (generalized): Secondary | ICD-10-CM | POA: Diagnosis not present

## 2019-12-11 DIAGNOSIS — M25622 Stiffness of left elbow, not elsewhere classified: Secondary | ICD-10-CM | POA: Diagnosis not present

## 2019-12-11 DIAGNOSIS — M25551 Pain in right hip: Secondary | ICD-10-CM

## 2019-12-11 DIAGNOSIS — M25522 Pain in left elbow: Secondary | ICD-10-CM | POA: Diagnosis not present

## 2019-12-11 DIAGNOSIS — R262 Difficulty in walking, not elsewhere classified: Secondary | ICD-10-CM | POA: Diagnosis not present

## 2019-12-11 NOTE — Therapy (Signed)
[facility] [address] [place], [address] Phone: [phone]   Fax:  [phone]  Physical Therapy Treatment  Patient Details  Name: Kendra [name] MRN: [id number] Date of Birth: [birth year] No data recorded  Encounter Date: 12/11/2019  PT End of Session - 12/11/19 1009    Visit Number  21    Number of Visits  22    Date for PT Re-Evaluation  12/07/19    Authorization Type  1/10    PT Start Time  0945    PT Stop Time  1030    PT Time Calculation (min)  45 min    Equipment Utilized During Treatment  Gait belt    Activity Tolerance  Patient tolerated treatment well;No increased pain    Behavior During Therapy  Flat affect       Past Medical History:  Diagnosis Date  . Anxiety   . Breast cancer ([place]) 03/2018   right breast cancer at 11:00 and 1:00  . Bursitis    bilateral hips and knees  . Complication of anesthesia   . Depression   . Family history of breast cancer 03/24/2018  . Family history of lung cancer 03/24/2018  . History of alcohol dependence ([facility]) 2007   no ETOH; resolved since 2007  . History of hiatal hernia   . History of psychosis 2014   due to sleep disturbance  . Hypertension   . Personal history of radiation therapy 06/2018-07/2018   right breast ca  . PONV (postoperative nausea and vomiting)     Past Surgical History:  Procedure Laterality Date  . BREAST BIOPSY Right 03/14/2018   11:00 DCIS and invasive ductal carcinoma  . BREAST BIOPSY Right 03/14/2018   1:00 Invasive ductal carcinoma  . BREAST LUMPECTOMY Right 04/27/2018   lumpectomy of 11 and 1:00 cancers, clear margins, negative LN  . BREAST LUMPECTOMY WITH RADIOACTIVE SEED AND SENTINEL LYMPH NODE BIOPSY Right 04/27/2018   Procedure: RIGHT BREAST LUMPECTOMY WITH RADIOACTIVE SEED X 2 AND RIGHT SENTINEL LYMPH NODE BIOPSY;  Surgeon: [name], MD;  Location: [facility];  Service: General;  Laterality:  Right;  . CATARACT EXTRACTION W/PHACO Left 08/30/2019   Procedure: CATARACT EXTRACTION PHACO AND INTRAOCULAR LENS PLACEMENT ([place]) LEFT panoptix toric  01:20.5  12.7%  10.26;  Surgeon: Leandrew Koyanagi, MD;  Location: [facility];  Service: Ophthalmology;  Laterality: Left;  . CATARACT EXTRACTION W/PHACO Right 09/20/2019   Procedure: CATARACT EXTRACTION PHACO AND INTRAOCULAR LENS PLACEMENT ([facility]) RIGHT 5.71  00:36.4  15.7%;  Surgeon: Leandrew Koyanagi, MD;  Location: [facility];  Service: Ophthalmology;  Laterality: Right;  . LAPAROTOMY N/A 02/10/2018   Procedure: EXPLORATORY LAPAROTOMY;  Surgeon: Isabel Caprice, MD;  Location: [facility];  Service: Gynecology;  Laterality: N/A;  . MASS EXCISION  01/2018   abdominal  . OVARIAN CYST REMOVAL    . SALPINGOOPHORECTOMY Bilateral 02/10/2018   Procedure: BILATERAL SALPINGO OOPHORECTOMY; PERITONEAL WASHINGS;  Surgeon: Isabel Caprice, MD;  Location[facility];  Service: Gynecology;  Laterality: Bilateral;  . TONSILLECTOMY    . TONSILLECTOMY AND ADENOIDECTOMY  1953    There were no vitals filed for this visit.  Subjective Assessment - 12/11/19 0957    Subjective  Patient reports no major changes since the previous session. Reports no pain currently and denies any falls. Patient reports she has been having difficulty with performing hip abduction in standing.    Pertinent History  Patient report unclear  regarding onset. Visited MD on Sept 16 and received injection with relief for approximately 1 month. Reports she cannot receive an additional injection pending eye surgery. Received dx from MD of hip bursitis in B hips. Reports pain with walking, stanindg, sitting 5/10 current 7/10 worst 3/10 best, with temporary relief when she is sitting "just right" and with naproxen. Agg: activity. Cormorbidities: hx of breast cancer, HTN, MCI, insomnia, cataracts    Limitations  Sitting;Standing;Walking;House hold activities    How long can you sit  comfortably?  Pain constant, reduced with neutral pelvic tilt    How long can you stand comfortably?  Pain constant, increases with time    How long can you walk comfortably?  Pain constant, increases with time    Patient Stated Goals  Be able to move without hurting, walk 1 hour    Currently in Pain?  No/denies    Pain Onset  More than a month ago       TREATMENT Assessment  Decreased hip extension B - able to achieve neutral hip positioning with OP from therapist Decreased quad length B in sidelying Therapeutic Exercise  Hip flexor stretch with foot on a step 45 sec holds x 2  Seated hip ER with GTB - 2 x 15  Seated hip abduction with YTB at ankles--2 x 15 Standing hip abduction in standing - 2 x 15  Hip extension in standing - 2 x 15   Performed exercises to address hip strength limitations    PT Education - 12/11/19 1009    Education provided  Yes    Education Details  form/technique with exercise; reviewed HEP    Person(s) Educated  Patient    Methods  Explanation;Demonstration    Comprehension  Verbalized understanding;Returned demonstration       PT Short Term Goals - 09/12/19 1419      PT SHORT TERM GOAL #1   Title  Patient will demonstrate complaince with HEP to speed recovery and reduce total number of visits.    Baseline  HEP given    Time  2    Period  Weeks    Status  New    Target Date  09/27/19        PT Long Term Goals - 12/07/19 [id number]      PT LONG TERM GOAL #1   Title  Patient will improve hip strength to 4/5 grossly to facilitate improved mobility with ADLs.    Baseline  3, 4-; 12/22 deferred to NV; 11/09/2019: 4-/5; 12/07/2019 L hip flexor 4, abd and ext 3+    Time  8    Period  Weeks    Status  On-going    Target Date  01/18/20      PT LONG TERM GOAL #2   Title  Patient will demonstrate walking tolerance increased to 30 minutes without increase in symptoms to demonstrate improved ability to perform walking program for cardiovascular health.     Baseline  10 minute tolerance (patient report); 12/22 10 minutes (per pt report); 11/09/2019: 7 min; 12/07/2019 unable to assess due to icy streets    Time  8    Period  Weeks    Status  Unable to assess    Target Date  01/18/20      PT LONG TERM GOAL #3   Title  Patient will report worst pain of 3/10 during therapy to demonstrate reduced disability    Baseline  Worst 7/10; 12/22 worst 9/10; 11/09/2019: 9/10; 12/07/2019 7/10  Time  8    Period  Weeks    Status  On-going    Target Date  01/18/20      PT LONG TERM GOAL #4   Title  Patient will demonstrate ability to perform all bed mobility maneuvers without increase in pain for reduced disability with mobility and transfers with ADLs.    Baseline  Reports pain with bed mobility supine <> sidelying L/R, sidelying to sitting EOB; 12/22 pt able to perform I without increased pain    Time  8    Period  Weeks    Status  Achieved    Target Date  01/18/20      PT LONG TERM GOAL #5   Title  Patient will improve 10MW speed to 1.0 m/sec to meet norms for minimal fall risk and full community ambulation.    Baseline  0.88 m/sec; 12/22 0.96 m/sec; 11/09/2019: deferred; 12/07/2019 0.90 m/sec    Time  8    Period  Weeks    Status  On-going    Target Date  01/18/20      PT LONG TERM GOAL #6   Title  Patient will increase 6MWT by 200 ft to demonstrate improvement in functional capacity for transition to cardiac walking program    Baseline  6MWT = 1195; 1/7/20201: deferred; 12/07/2019 1095    Time  8    Period  Weeks    Status  On-going    Target Date  01/18/20            Plan - 12/11/19 1018    Clinical Impression Statement  Reassessed Hip extension during today's session and patient demonstrates limitations with ability to achieve neutral positioning with OP from therapist. Continued to focus on loading the LE in single leg positions to further improve hip muscular strength. Patient will continue to benefit from further skilled therapy to return to  prior level of function.    Personal Factors and Comorbidities  Age;Comorbidity 3+;Time since onset of injury/illness/exacerbation;Comorbidity 2    Comorbidities  MCI, HTN, CA, cataracts    Examination-Activity Limitations  Bend;Sit;Squat;Stairs;Stand;Sleep    Examination-Participation Restrictions  Cleaning;Community Activity    Stability/Clinical Decision Making  Evolving/Moderate complexity    Rehab Potential  Fair    Clinical Impairments Affecting Rehab Potential  MCI    PT Frequency  2x / week   Eval and 1 f/u visit   PT Treatment/Interventions  ADLs/Self Care Home Management;Therapeutic exercise;Patient/family education;Neuromuscular re-education;Therapeutic activities;Functional mobility training;Balance training;Manual techniques;Gait training;Stair training;Moist Heat;Electrical Stimulation;Cryotherapy;Taping;Dry needling;Energy conservation;Passive range of motion;Joint Manipulations    PT Next Visit Plan  Check hip extension limitation, low-tech glute therex    PT Home Exercise Plan  lateral step ups, step ups, hip kickers ext/abd red theraband    Consulted and Agree with Plan of Care  Patient       Patient will benefit from skilled therapeutic intervention in order to improve the following deficits and impairments:  Decreased range of motion, Postural dysfunction, Pain, Abnormal gait, Decreased activity tolerance, Decreased coordination, Decreased endurance, Decreased balance, Decreased mobility, Difficulty walking, Hypomobility, Increased muscle spasms, Impaired perceived functional ability, Decreased strength, Impaired flexibility  Visit Diagnosis: Pain in left hip  Pain in right hip  Difficulty in walking, not elsewhere classified     Problem List Patient Active Problem List   Diagnosis Date Noted  . Bipolar disorder, in full remission, most recent episode depressed ([facility]) 11/08/2019  . Avitaminosis D 08/24/2019  . Insomnia due to mental condition 05/31/2019  .  Bipolar I disorder, most recent episode depressed ([facility]) 05/31/2019  . Mild cognitive disorder 05/31/2019  . Alcohol use disorder, moderate, in sustained remission ([facility]) 05/31/2019  . Elevated tumor markers 05/27/2019  . GAD (generalized anxiety disorder) 04/10/2019  . Altered mental status 04/10/2019  . MDD (major depressive disorder), severe ([facility]) 01/31/2019  . High serum carbohydrate antigen 19-9 (CA19-9) 12/01/2018  . Adjustment disorder with anxious mood 11/24/2018  . Essential hypertension 11/24/2018  . Screening for lipid disorders 11/24/2018  . B12 deficiency 10/17/2018  . Incisional hernia, without obstruction or gangrene 09/12/2018  . Genetic testing 04/07/2018  . Family history of breast cancer 03/24/2018  . Family history of lung cancer 03/24/2018  . Malignant neoplasm of upper-outer quadrant of right breast in female, estrogen receptor positive ([facility]) 03/17/2018  . Malignant neoplasm of upper-inner quadrant of right breast in female, estrogen receptor positive ([facility]) 03/17/2018  . History of alcohol dependence ([facility]) 02/07/2018  . History of psychosis 02/07/2018  . History of insomnia 02/07/2018    Blythe Stanford, PT DPT 12/11/2019, 10:31 AM  [facility] [address] [place], [address] Phone: [phone]   Fax:  [phone]  Name: Suong Bienaime MRN: [id number] Date of Birth: 12/13/1945

## 2019-12-13 ENCOUNTER — Ambulatory Visit: Payer: Medicare PPO

## 2019-12-13 ENCOUNTER — Other Ambulatory Visit: Payer: Self-pay

## 2019-12-13 DIAGNOSIS — R262 Difficulty in walking, not elsewhere classified: Secondary | ICD-10-CM

## 2019-12-13 DIAGNOSIS — M25551 Pain in right hip: Secondary | ICD-10-CM

## 2019-12-13 DIAGNOSIS — M25552 Pain in left hip: Secondary | ICD-10-CM

## 2019-12-13 DIAGNOSIS — M25522 Pain in left elbow: Secondary | ICD-10-CM | POA: Diagnosis not present

## 2019-12-13 DIAGNOSIS — M25622 Stiffness of left elbow, not elsewhere classified: Secondary | ICD-10-CM | POA: Diagnosis not present

## 2019-12-13 DIAGNOSIS — M6281 Muscle weakness (generalized): Secondary | ICD-10-CM | POA: Diagnosis not present

## 2019-12-13 NOTE — Therapy (Signed)
[facility] [address], [place], [address] Phone: [phone]   Fax:  [phone]  Physical Therapy Treatment  Patient Details  Name: Kendra [name] MRN: [id number] Date of Birth: 08-25-1946 No data recorded  Encounter Date: 12/13/2019  PT End of Session - 12/13/19 1125    Visit Number  22    Number of Visits  22    Date for PT Re-Evaluation  12/07/19    Authorization Type  2/10    PT Start Time  1032    PT Stop Time  1115    PT Time Calculation (min)  43 min    Equipment Utilized During Treatment  Gait belt    Activity Tolerance  Patient tolerated treatment well;No increased pain    Behavior During Therapy  Flat affect       Past Medical History:  Diagnosis Date  . Anxiety   . Breast cancer ([name]) 03/2018   right breast cancer at 11:00 and 1:00  . Bursitis    bilateral hips and knees  . Complication of anesthesia   . Depression   . Family history of breast cancer 03/24/2018  . Family history of lung cancer 03/24/2018  . History of alcohol dependence ([name]) 2007   no ETOH; resolved since 2007  . History of hiatal hernia   . History of psychosis 2014   due to sleep disturbance  . Hypertension   . Personal history of radiation therapy 06/2018-07/2018   right breast ca  . PONV (postoperative nausea and vomiting)     Past Surgical History:  Procedure Laterality Date  . BREAST BIOPSY Right 03/14/2018   11:00 DCIS and invasive ductal carcinoma  . BREAST BIOPSY Right 03/14/2018   1:00 Invasive ductal carcinoma  . BREAST LUMPECTOMY Right 04/27/2018   lumpectomy of 11 and 1:00 cancers, clear margins, negative LN  . BREAST LUMPECTOMY WITH RADIOACTIVE SEED AND SENTINEL LYMPH NODE BIOPSY Right 04/27/2018   Procedure: RIGHT BREAST LUMPECTOMY WITH RADIOACTIVE SEED X 2 AND RIGHT SENTINEL LYMPH NODE BIOPSY;  Surgeon: [name], MD;  Location: [facility];  Service: General;   Laterality: Right;  . CATARACT EXTRACTION W/PHACO Left 08/30/2019   Procedure: CATARACT EXTRACTION PHACO AND INTRAOCULAR LENS PLACEMENT ([facility]) LEFT panoptix toric  01:20.5  12.7%  10.26;  Surgeon: Leandrew Koyanagi, MD;  Location: [facility];  Service: Ophthalmology;  Laterality: Left;  . CATARACT EXTRACTION W/PHACO Right 09/20/2019   Procedure: CATARACT EXTRACTION PHACO AND INTRAOCULAR LENS PLACEMENT ([facility]) RIGHT 5.71  00:36.4  15.7%;  Surgeon: Leandrew Koyanagi, MD;  Location: [facility];  Service: Ophthalmology;  Laterality: Right;  . LAPAROTOMY N/A 02/10/2018   Procedure: EXPLORATORY LAPAROTOMY;  Surgeon: Isabel Caprice, MD;  [facility];  Service: Gynecology;  Laterality: N/A;  . MASS EXCISION  01/2018   abdominal  . OVARIAN CYST REMOVAL    . SALPINGOOPHORECTOMY Bilateral 02/10/2018   Procedure: BILATERAL SALPINGO OOPHORECTOMY; PERITONEAL WASHINGS;  Surgeon: Isabel Caprice, MD;  [facility];  Service: Gynecology;  Laterality: Bilateral;  . TONSILLECTOMY    . TONSILLECTOMY AND ADENOIDECTOMY  1953    There were no vitals filed for this visit.  Subjective Assessment - 12/13/19 1122    Subjective  Patient reports hip/knee pain this morning with trouble walking but no falls or near falls. Reports pain seated and rising, with coughing/sneezing, and with changing position in bed. Remains concerned with bruise along lateral thigh although reports change in  color consistent with healing.    Pertinent History  Patient report unclear regarding onset. Visited MD on Sept 16 and received injection with relief for approximately 1 month. Reports she cannot receive an additional injection pending eye surgery. Received dx from MD of hip bursitis in B hips. Reports pain with walking, stanindg, sitting 5/10 current 7/10 worst 3/10 best, with temporary relief when she is sitting "just right" and with naproxen. Agg: activity. Cormorbidities: hx of breast cancer, HTN, MCI,  insomnia, cataracts    Limitations  Sitting;Standing;Walking;House hold activities    How long can you sit comfortably?  Pain constant, reduced with neutral pelvic tilt    How long can you stand comfortably?  Pain constant, increases with time    How long can you walk comfortably?  Pain constant, increases with time    Patient Stated Goals  Be able to move without hurting, walk 1 hour    Currently in Pain?  Yes    Pain Score  7     Pain Location  Hip    Pain Orientation  Left    Pain Descriptors / Indicators  Aching    Pain Type  Chronic pain    Pain Onset  More than a month ago    Pain Frequency  Constant    Multiple Pain Sites  No         TREATMENT  MT Sidelying over R with RLE flexed and LLE in neutral over bolster:  PROM hip flexor stretch x 30 sec  Active hip flexor stretch x 10  Belt-assisted mobilization hip P-A grade 2-3 x 30 each with positional adjustments for patient comfort  MT for increased hip extension ROM and decreased muscular tightness around LLE hip   TE R sidelying isometric "ball squish" hip extension 5 sec hold 2 x 10 with cues for pelvic alignment Thomas stretch LLE x 30 sec Thomas stretch with gentle active quad ROM x 10 reps Calf stretch x 30 sec LLE hip flexor stretch in standing RLE on step x 30 sec Standing LLE extension with LLE on slider - deferred due to compensation Standing hip extension with UE support x 10, with 5 sec hold x 10 - tactile, verbal cues for pelvic stability and dissociation Standing hip abd with UE support x 10 R/L - note anterior lean, patient unable to correct with cues  TE for gluteal strengthening, increased hip ROM, and pain relief.   PT Education - 12/13/19 1124    Education provided  Yes    Education Details  form/technique with exercise    Person(s) Educated  Patient    Methods  Explanation;Demonstration;Tactile cues;Verbal cues    Comprehension  Verbalized understanding;Returned demonstration;Verbal cues  required;Tactile cues required       PT Short Term Goals - 09/12/19 1419      PT SHORT TERM GOAL #1   Title  Patient will demonstrate complaince with HEP to speed recovery and reduce total number of visits.    Baseline  HEP given    Time  2    Period  Weeks    Status  New    Target Date  09/27/19        PT Long Term Goals - 12/07/19 [id number]      PT LONG TERM GOAL #1   Title  Patient will improve hip strength to 4/5 grossly to facilitate improved mobility with ADLs.    Baseline  3, 4-; 12/22 deferred to [place]; 11/09/2019: 4-/5; 12/07/2019 L hip flexor  4, abd and ext 3+    Time  8    Period  Weeks    Status  On-going    Target Date  01/18/20      PT LONG TERM GOAL #2   Title  Patient will demonstrate walking tolerance increased to 30 minutes without increase in symptoms to demonstrate improved ability to perform walking program for cardiovascular health.    Baseline  10 minute tolerance (patient report); 12/22 10 minutes (per pt report); 11/09/2019: 7 min; 12/07/2019 unable to assess due to icy streets    Time  8    Period  Weeks    Status  Unable to assess    Target Date  01/18/20      PT LONG TERM GOAL #3   Title  Patient will report worst pain of 3/10 during therapy to demonstrate reduced disability    Baseline  Worst 7/10; 12/22 worst 9/10; 11/09/2019: 9/10; 12/07/2019 7/10    Time  8    Period  Weeks    Status  On-going    Target Date  01/18/20      PT LONG TERM GOAL #4   Title  Patient will demonstrate ability to perform all bed mobility maneuvers without increase in pain for reduced disability with mobility and transfers with ADLs.    Baseline  Reports pain with bed mobility supine <> sidelying L/R, sidelying to sitting EOB; 12/22 pt able to perform I without increased pain    Time  8    Period  Weeks    Status  Achieved    Target Date  01/18/20      PT LONG TERM GOAL #5   Title  Patient will improve 10MW speed to 1.0 m/sec to meet norms for minimal fall risk and full community  ambulation.    Baseline  0.88 m/sec; 12/22 0.96 m/sec; 11/09/2019: deferred; 12/07/2019 0.90 m/sec    Time  8    Period  Weeks    Status  On-going    Target Date  01/18/20      PT LONG TERM GOAL #6   Title  Patient will increase 6MWT by 200 ft to demonstrate improvement in functional capacity for transition to cardiac walking program    Baseline  6MWT = 1195; 1/7/20201: deferred; 12/07/2019 1095    Time  8    Period  Weeks    Status  On-going    Target Date  01/18/20            Plan - 12/13/19 1125    Clinical Impression Statement  Following manual mobilization and stretching, patient demonstrates improvement with internal cues for isolated hip extension with pelvic stability achieving neutral hip extension actively. Early fatigue apparent in standing therex resulting in recurrence of anterior lean compensation after ~5 repetitions but patient is able to correct with cueing. Patient reports feeling better following therapy. Plan to continue with lumbopelvic and hip dissociation for reduced tightness around hip. Patient will benefit from skilled physical therapy to return to PLOF.    Personal Factors and Comorbidities  Age;Comorbidity 3+;Time since onset of injury/illness/exacerbation;Comorbidity 2    Comorbidities  MCI, HTN, CA, cataracts    Examination-Activity Limitations  Bend;Sit;Squat;Stairs;Stand;Sleep    Examination-Participation Restrictions  Cleaning;Community Activity    Stability/Clinical Decision Making  Evolving/Moderate complexity    Rehab Potential  Fair    Clinical Impairments Affecting Rehab Potential  MCI    PT Frequency  2x / week   Eval and 1 f/u  visit   PT Treatment/Interventions  ADLs/Self Care Home Management;Therapeutic exercise;Patient/family education;Neuromuscular re-education;Therapeutic activities;Functional mobility training;Balance training;Manual techniques;Gait training;Stair training;Moist Heat;Electrical Stimulation;Cryotherapy;Taping;Dry needling;Energy  conservation;Passive range of motion;Joint Manipulations    PT Next Visit Plan  Progress glute therex, pelvic-hip dissociation; check and progress HEP with stretches    PT Home Exercise Plan  lateral step ups, step ups, hip kickers ext/abd red theraband    Consulted and Agree with Plan of Care  Patient       Patient will benefit from skilled therapeutic intervention in order to improve the following deficits and impairments:  Decreased range of motion, Postural dysfunction, Pain, Abnormal gait, Decreased activity tolerance, Decreased coordination, Decreased endurance, Decreased balance, Decreased mobility, Difficulty walking, Hypomobility, Increased muscle spasms, Impaired perceived functional ability, Decreased strength, Impaired flexibility  Visit Diagnosis: Pain in left hip  Pain in right hip  Difficulty in walking, not elsewhere classified     Problem List Patient Active Problem List   Diagnosis Date Noted  . Bipolar disorder, in full remission, most recent episode depressed ([facility]) 11/08/2019  . Avitaminosis D 08/24/2019  . Insomnia due to mental condition 05/31/2019  . Bipolar I disorder, most recent episode depressed ([facility]) 05/31/2019  . Mild cognitive disorder 05/31/2019  . Alcohol use disorder, moderate, in sustained remission ([facility]) 05/31/2019  . Elevated tumor markers 05/27/2019  . GAD (generalized anxiety disorder) 04/10/2019  . Altered mental status 04/10/2019  . MDD (major depressive disorder), severe ([facility]) 01/31/2019  . High serum carbohydrate antigen 19-9 (CA19-9) 12/01/2018  . Adjustment disorder with anxious mood 11/24/2018  . Essential hypertension 11/24/2018  . Screening for lipid disorders 11/24/2018  . B12 deficiency 10/17/2018  . Incisional hernia, without obstruction or gangrene 09/12/2018  . Genetic testing 04/07/2018  . Family history of breast cancer 03/24/2018  . Family history of lung cancer 03/24/2018  . Malignant neoplasm of upper-outer quadrant of  right breast in female, estrogen receptor positive ([facility]) 03/17/2018  . Malignant neoplasm of upper-inner quadrant of right breast in female, estrogen receptor positive ([facility]) 03/17/2018  . History of alcohol dependence ([facility]) 02/07/2018  . History of psychosis 02/07/2018  . History of insomnia 02/07/2018    Blythe Stanford 12/13/2019, 11:32 AM  [facility] [address] [place], [address] Phone: [phone]   Fax:  [phone]  Name: Aitanna Kitzinger MRN: [id number] Date of Birth: Jun 19, 1946

## 2019-12-14 NOTE — Addendum Note (Signed)
Addended by: Blain Pais on: 12/14/2019 08:04 AM   Modules accepted: Orders

## 2019-12-18 ENCOUNTER — Ambulatory Visit: Payer: Medicare PPO | Admitting: Occupational Therapy

## 2019-12-19 ENCOUNTER — Ambulatory Visit (INDEPENDENT_AMBULATORY_CARE_PROVIDER_SITE_OTHER): Payer: Medicare PPO | Admitting: Licensed Clinical Social Worker

## 2019-12-19 ENCOUNTER — Other Ambulatory Visit: Payer: Self-pay

## 2019-12-19 DIAGNOSIS — F3176 Bipolar disorder, in full remission, most recent episode depressed: Secondary | ICD-10-CM | POA: Diagnosis not present

## 2019-12-19 NOTE — Progress Notes (Signed)
Virtual Visit via Video Note  I connected with Cambreigh Lunde on 12/19/19 at 10:00 AM EST by a video enabled telemedicine application and verified that I am speaking with the correct person using two identifiers.   I discussed the limitations of evaluation and management by telemedicine and the availability of in person appointments. The patient expressed understanding and agreed to proceed.  I discussed the assessment and treatment plan with the patient. The patient was provided an opportunity to ask questions and all were answered. The patient agreed with the plan and demonstrated an understanding of the instructions.   The patient was advised to call back or seek an in-person evaluation if the symptoms worsen or if the condition fails to improve as anticipated.  I provided 45 minutes of non-face-to-face time during this encounter.   Alden Hipp, LCSW   THERAPIST PROGRESS NOTE  Session Time: 1000  Participation Level: Active  Behavioral Response: NeatAlertAnxious  Type of Therapy: Individual Therapy  Treatment Goals addressed: Coping  Interventions: Supportive  Summary: Yaretsi Beyer is a 74 y.o. female who presents with continued symptoms related to her diagnosis. Laureen reports doing well since our last session. Terianne reports no new issues have come up for her; however, she reports her pain from falling and her chronic pain have been more of a focus recently. Lanika reports, at times, it is difficult to focus on other things outside of her pain, and she has talked to her MD about this as well. LCSW encouraged Camela to continue to utilize CBT skills in moments where she worries she will not be able to manage the pain or to get through that moment. We walked through several examples of how Dorenda could utilize CBT skills in order to manage her pain and negative thoughts associated with that pain. We also introduced the idea of utilizing mindfulness and other grounding techniques in  order to manage pain in the moment. We walked through several examples of how to do this appropriately, and LCSW provided Monisha with several handouts she could reference after our session. Gemini expressed understanding and agreement with all information presented.   Suicidal/Homicidal: No  Therapist Response: Tashima continues to work towards her tx goals but has not yet reached them. We will continue to work on improving CBT skills and mindfulness skills to manage anxiety in the moment. We will also utilize those skills to address physical pain.   Plan: Return again in 2 weeks.  Diagnosis: Axis I: Bipolar Disorder    Axis II: No diagnosis    Alden Hipp, LCSW 12/19/2019

## 2019-12-20 ENCOUNTER — Ambulatory Visit: Payer: Medicare PPO | Admitting: Occupational Therapy

## 2019-12-20 ENCOUNTER — Telehealth: Payer: Self-pay

## 2019-12-20 ENCOUNTER — Ambulatory Visit: Payer: Medicare PPO

## 2019-12-20 ENCOUNTER — Encounter: Payer: Self-pay | Admitting: Occupational Therapy

## 2019-12-20 DIAGNOSIS — M25622 Stiffness of left elbow, not elsewhere classified: Secondary | ICD-10-CM

## 2019-12-20 DIAGNOSIS — R262 Difficulty in walking, not elsewhere classified: Secondary | ICD-10-CM | POA: Diagnosis not present

## 2019-12-20 DIAGNOSIS — M25552 Pain in left hip: Secondary | ICD-10-CM | POA: Diagnosis not present

## 2019-12-20 DIAGNOSIS — M25522 Pain in left elbow: Secondary | ICD-10-CM

## 2019-12-20 DIAGNOSIS — M25551 Pain in right hip: Secondary | ICD-10-CM

## 2019-12-20 DIAGNOSIS — M6281 Muscle weakness (generalized): Secondary | ICD-10-CM | POA: Diagnosis not present

## 2019-12-20 NOTE — Patient Instructions (Signed)
Heat on elbow  PROM for elbow extention end range - 3 position   1-2 lbs 12 reps elbow 3 positions against wall  Wrist flexion gentle stretch  2 lbs wrist flexion and sup/pro  12 reps pain free

## 2019-12-20 NOTE — Therapy (Signed)
[facility] [address] [place], [address] Phone: [phone]   Fax:  [phone]  Occupational Therapy Evaluation  Patient Details  Name: Kendra [name] MRN: [id number] Date of Birth: [birth year] Referring Provider (OT): Vance Peper Date: 12/20/2019  OT End of Session - 12/20/19 2018    Visit Number  1    Number of Visits  8    Date for OT Re-Evaluation  02/14/20    OT Start Time  1400    OT Stop Time  1451    OT Time Calculation (min)  51 min    Activity Tolerance  Patient tolerated treatment well    Behavior During Therapy  Flat affect       Past Medical History:  Diagnosis Date  . Anxiety   . Breast cancer ([place]) 03/2018   right breast cancer at 11:00 and 1:00  . Bursitis    bilateral hips and knees  . Complication of anesthesia   . Depression   . Family history of breast cancer 03/24/2018  . Family history of lung cancer 03/24/2018  . History of alcohol dependence ([place]) 2007   no ETOH; resolved since 2007  . History of hiatal hernia   . History of psychosis 2014   due to sleep disturbance  . Hypertension   . Personal history of radiation therapy 06/2018-07/2018   right breast ca  . PONV (postoperative nausea and vomiting)     Past Surgical History:  Procedure Laterality Date  . BREAST BIOPSY Right 03/14/2018   11:00 DCIS and invasive ductal carcinoma  . BREAST BIOPSY Right 03/14/2018   1:00 Invasive ductal carcinoma  . BREAST LUMPECTOMY Right 04/27/2018   lumpectomy of 11 and 1:00 cancers, clear margins, negative LN  . BREAST LUMPECTOMY WITH RADIOACTIVE SEED AND SENTINEL LYMPH NODE BIOPSY Right 04/27/2018   Procedure: RIGHT BREAST LUMPECTOMY WITH RADIOACTIVE SEED X 2 AND RIGHT SENTINEL LYMPH NODE BIOPSY;  Surgeon: [name], MD;  Location: [facility];  Service: General;  Laterality: Right;  . CATARACT EXTRACTION W/PHACO Left 08/30/2019   Procedure:  CATARACT EXTRACTION PHACO AND INTRAOCULAR LENS PLACEMENT ([place]) LEFT panoptix toric  01:20.5  12.7%  10.26;  Surgeon: Leandrew Koyanagi, MD;  Location: [facility];  Service: Ophthalmology;  Laterality: Left;  . CATARACT EXTRACTION W/PHACO Right 09/20/2019   Procedure: CATARACT EXTRACTION PHACO AND INTRAOCULAR LENS PLACEMENT ([facility]) RIGHT 5.71  00:36.4  15.7%;  Surgeon: Leandrew Koyanagi, MD;  Location: [facility];  Service: Ophthalmology;  Laterality: Right;  . LAPAROTOMY N/A 02/10/2018   Procedure: EXPLORATORY LAPAROTOMY;  Surgeon: Isabel Caprice, MD;  Location: [facility];  Service: Gynecology;  Laterality: N/A;  . MASS EXCISION  01/2018   abdominal  . OVARIAN CYST REMOVAL    . SALPINGOOPHORECTOMY Bilateral 02/10/2018   Procedure: BILATERAL SALPINGO OOPHORECTOMY; PERITONEAL WASHINGS;  Surgeon: Isabel Caprice, MD;  Location: [facility];  Service: Gynecology;  Laterality: Bilateral;  . TONSILLECTOMY    . TONSILLECTOMY AND ADENOIDECTOMY  1953    There were no vitals filed for this visit.  Subjective Assessment - 12/20/19 2008    Subjective   The doctor showed me this exercise that I have been doing -and it do hurt when I do things like pushing up from chair but otherwise not hurting with bending    Pertinent History  Pt fell 11/23/2019 and fracture her R elbow - minimal displaced radius head fx - pt in sling  if she is out and about - but refer to OT for ROM ,PROM and weight limited 1-2 lbs -    Patient Stated Goals  I would like the ROM , strength better so I can push up from chair and have no pain    Currently in Pain?  Yes    Pain Score  5     Pain Location  Elbow    Pain Orientation  Left    Pain Descriptors / Indicators  Aching    Pain Type  Acute pain    Pain Onset  1 to 4 weeks ago    Aggravating Factors   extention  of elbow and pushing up        [facility] OT Assessment - 12/20/19 0001      Assessment   Medical Diagnosis  L minimal displace radius head fx      Referring Provider (OT)  Jerilee Hoh     Onset Date/Surgical Date  11/23/19    Hand Dominance  Left    Next MD Visit  --   in March per pt      Precautions   Precaution Comments  --   1-2 lbs limit on L elbow     Balance Screen   Has the patient fallen in the past 6 months  Yes    How many times?  1    Has the patient had a decrease in activity level because of a fear of falling?   No    Is the patient reluctant to leave their home because of a fear of falling?   No      Home  Environment   Lives With  Spouse      Prior Function   Vocation  Retired    Leisure  likes to cross stitch, some flowers in summer, cleianing with husband      AROM   Right Elbow Flexion  150    Right Elbow Extension  0    Left Elbow Flexion  150    Left Elbow Extension  -22    Left Forearm Pronation  90 Degrees    Left Forearm Supination  90 Degrees    Right Wrist Extension  70 Degrees    Right Wrist Flexion  82 Degrees   WNL           Heat prior to review of HEP to L elbow:     Heat on elbow  PROM for elbow extention end range - 3 position   1-2 lbs 12 reps elbow 3 positions against wall  Wrist flexion gentle stretch  2 lbs wrist flexion and sup/pro  12 reps pain free           OT Education - 12/20/19 2018    Education Details  findings of eval , Precautions - for lifting less than 2 lbs and no pushing up on hand    Person(s) Educated  Patient    Methods  Explanation;Demonstration;Tactile cues;Verbal cues;Handout    Comprehension  Verbal cues required;Returned demonstration;Verbalized understanding       OT Short Term Goals - 12/20/19 2028      OT SHORT TERM GOAL #1   Title  Pt to be independent in HEP to increase L elbow extention to -5 and increase strenght to carry more than 5 lbs    Baseline  no knowledge of HEP    Time  4    Period  Weeks    Status  New  Target Date  01/17/20        OT Long Term Goals - 12/20/19 2029      OT LONG TERM GOAL #1   Title  L  Elbow extention improve for pt to pull shirt over head, push up and carry more than 8 lbs without increase symptoms    Baseline  -22 extention of L elbow and weight limit 2 lbs - pushing up by accident pain 5/10    Time  8    Period  Weeks    Status  New    Target Date  02/14/20      OT LONG TERM GOAL #2   Title  Pt AROM and strength in L elbow improve to prior level for function    Baseline  -22 extention , weight 2 lbs limited - cannot do makidng bed, flowers, dry back , carry more than 2 lbs , no pushing up    Time  8    Period  Weeks    Status  New    Target Date  02/14/20            Plan - 12/20/19 2019    Clinical Impression Statement  Pt present at OT eval tomorrow 4 wks out from fall resulting in L minima displace fx of radius head - pt can wear sling when out and about - and weight limit of 1-2 lbs per MD - pt show great progress in elbow flexion and forearm /wrist AROM - flexion of wrist tight somewhat and elbow extentoin -22 degrees limiting her functional use of L hand in ADL's and IADL's    OT Occupational Profile and History  Problem Focused Assessment - Including review of records relating to presenting problem    Occupational performance deficits (Please refer to evaluation for details):  ADL's;IADL's;Play;Leisure;Social Participation    Body Structure / Function / Physical Skills  ADL;Flexibility;ROM;UE functional use;Pain;Strength;IADL    Rehab Potential  Good    Clinical Decision Making  Limited treatment options, no task modification necessary    Comorbidities Affecting Occupational Performance:  May have comorbidities impacting occupational performance   cognitive and bipolar   Modification or Assistance to Complete Evaluation   No modification of tasks or assist necessary to complete eval    OT Frequency  1x / week    OT Duration  8 weeks    OT Treatment/Interventions  Self-care/ADL training;Therapeutic exercise;Patient/family  education;Cryotherapy;Paraffin;Moist Heat;Manual Therapy;Passive range of motion;Therapeutic activities    Plan  assess progress with HEP    OT Home Exercise Plan  see pt instruction    Consulted and Agree with Plan of Care  Patient       Patient will benefit from skilled therapeutic intervention in order to improve the following deficits and impairments:   Body Structure / Function / Physical Skills: ADL, Flexibility, ROM, UE functional use, Pain, Strength, IADL       Visit Diagnosis: Stiffness of left elbow, not elsewhere classified - Plan: Ot plan of care cert/re-cert  Pain in left elbow - Plan: Ot plan of care cert/re-cert  Muscle weakness (generalized) - Plan: Ot plan of care cert/re-cert    Problem List Patient Active Problem List   Diagnosis Date Noted  . Bipolar disorder, in full remission, most recent episode depressed ([place]) 11/08/2019  . Avitaminosis D 08/24/2019  . Insomnia due to mental condition 05/31/2019  . Bipolar I disorder, most recent episode depressed ([place]) 05/31/2019  . Mild cognitive disorder 05/31/2019  . Alcohol use  disorder, moderate, in sustained remission ([place]) 05/31/2019  . Elevated tumor markers 05/27/2019  . GAD (generalized anxiety disorder) 04/10/2019  . Altered mental status 04/10/2019  . MDD (major depressive disorder), severe ([place]) 01/31/2019  . High serum carbohydrate antigen 19-9 (CA19-9) 12/01/2018  . Adjustment disorder with anxious mood 11/24/2018  . Essential hypertension 11/24/2018  . Screening for lipid disorders 11/24/2018  . B12 deficiency 10/17/2018  . Incisional hernia, without obstruction or gangrene 09/12/2018  . Genetic testing 04/07/2018  . Family history of breast cancer 03/24/2018  . Family history of lung cancer 03/24/2018  . Malignant neoplasm of upper-outer quadrant of right breast in female, estrogen receptor positive ([place]) 03/17/2018  . Malignant neoplasm of upper-inner quadrant of right breast in female, estrogen  receptor positive ([place]) 03/17/2018  . History of alcohol dependence ([place]) 02/07/2018  . History of psychosis 02/07/2018  . History of insomnia 02/07/2018    Rosalyn Gess 12/20/2019, 8:34 PM  [facility] [address] [place], [address] Phone: [phone]   Fax:  [phone]  Name: Kendra [name] MRN: [id number] Date of Birth: 11/07/1945

## 2019-12-20 NOTE — Telephone Encounter (Signed)
Copied from [place] [phone]. Topic: General - Other >> Dec 20, 2019  9:59 AM Rayann Heman wrote: Reason for CRM: pt called and stated that she would like a call back from the nurse regarding an appointment she is going to. Pt is having lab work done tomorrow and would like to know if Dr B would like any additional blood work done. Please advise

## 2019-12-20 NOTE — Telephone Encounter (Signed)
Patient reports she will be going to have labs drawn tomorrow that have been order by Dr. Mike Gip Oncologist. Patient wants to know if would like to add any labs to check for cause of her recent falls. Please advise.

## 2019-12-20 NOTE — Telephone Encounter (Signed)
I'd like for her Vitamin D to be rechecked as it was low.  I think the falls were related to gabapentin, so we don't need ot check labs for that

## 2019-12-20 NOTE — Telephone Encounter (Signed)
Pt advised.   Thanks,   -Aissatou Fronczak  

## 2019-12-20 NOTE — Therapy (Signed)
[facility] [address], [place], [address] Phone: [phone]   Fax:  [phone]  Physical Therapy Treatment  Patient Details  Name: Kendra [name] MRN: [id number] Date of Birth: 07/05/1946 No data recorded  Encounter Date: 12/20/2019  PT End of Session - 12/20/19 1300    Visit Number  23    Number of Visits  30    Date for PT Re-Evaluation  01/18/20    Authorization Type  4/10    PT Start Time  1120    PT Stop Time  1200    PT Time Calculation (min)  40 min    Equipment Utilized During Treatment  Gait belt    Activity Tolerance  Patient tolerated treatment well;No increased pain    Behavior During Therapy  Flat affect       Past Medical History:  Diagnosis Date  . Anxiety   . Breast cancer ([facility]) 03/2018   right breast cancer at 11:00 and 1:00  . Bursitis    bilateral hips and knees  . Complication of anesthesia   . Depression   . Family history of breast cancer 03/24/2018  . Family history of lung cancer 03/24/2018  . History of alcohol dependence ([place]) 2007   no ETOH; resolved since 2007  . History of hiatal hernia   . History of psychosis 2014   due to sleep disturbance  . Hypertension   . Personal history of radiation therapy 06/2018-07/2018   right breast ca  . PONV (postoperative nausea and vomiting)     Past Surgical History:  Procedure Laterality Date  . BREAST BIOPSY Right 03/14/2018   11:00 DCIS and invasive ductal carcinoma  . BREAST BIOPSY Right 03/14/2018   1:00 Invasive ductal carcinoma  . BREAST LUMPECTOMY Right 04/27/2018   lumpectomy of 11 and 1:00 cancers, clear margins, negative LN  . BREAST LUMPECTOMY WITH RADIOACTIVE SEED AND SENTINEL LYMPH NODE BIOPSY Right 04/27/2018   Procedure: RIGHT BREAST LUMPECTOMY WITH RADIOACTIVE SEED X 2 AND RIGHT SENTINEL LYMPH NODE BIOPSY;  Surgeon: [name], MD;  Location: [facility];  Service: General;   Laterality: Right;  . CATARACT EXTRACTION W/PHACO Left 08/30/2019   Procedure: CATARACT EXTRACTION PHACO AND INTRAOCULAR LENS PLACEMENT ([place]) LEFT panoptix toric  01:20.5  12.7%  10.26;  Surgeon: Leandrew Koyanagi, MD;  Location: [facility];  Service: Ophthalmology;  Laterality: Left;  . CATARACT EXTRACTION W/PHACO Right 09/20/2019   Procedure: CATARACT EXTRACTION PHACO AND INTRAOCULAR LENS PLACEMENT ([facility]) RIGHT 5.71  00:36.4  15.7%;  Surgeon: Leandrew Koyanagi, MD;  Location: [facility];  Service: Ophthalmology;  Laterality: Right;  . LAPAROTOMY N/A 02/10/2018   Procedure: EXPLORATORY LAPAROTOMY;  Surgeon: Isabel Caprice, MD;  Location: [facility];  Service: Gynecology;  Laterality: N/A;  . MASS EXCISION  01/2018   abdominal  . OVARIAN CYST REMOVAL    . SALPINGOOPHORECTOMY Bilateral 02/10/2018   Procedure: BILATERAL SALPINGO OOPHORECTOMY; PERITONEAL WASHINGS;  Surgeon: Isabel Caprice, MD;  Location: [facility];  Service: Gynecology;  Laterality: Bilateral;  . TONSILLECTOMY    . TONSILLECTOMY AND ADENOIDECTOMY  1953    There were no vitals filed for this visit.  Subjective Assessment - 12/20/19 1120    Subjective  Patient reports that she has not had any more stumbling episodes. Cannot recall how she felt following last treatment session. Reports no change in hip.    Pertinent History  Patient report unclear regarding onset. Visited MD on  Sept 16 and received injection with relief for approximately 1 month. Reports she cannot receive an additional injection pending eye surgery. Received dx from MD of hip bursitis in B hips. Reports pain with walking, stanindg, sitting 5/10 current 7/10 worst 3/10 best, with temporary relief when she is sitting "just right" and with naproxen. Agg: activity. Cormorbidities: hx of breast cancer, HTN, MCI, insomnia, cataracts    Limitations  Sitting;Standing;Walking;House hold activities    How long can you sit comfortably?  Pain constant, reduced  with neutral pelvic tilt    How long can you stand comfortably?  Pain constant, increases with time    How long can you walk comfortably?  Pain constant, increases with time    Patient Stated Goals  Be able to move without hurting, walk 1 hour    Currently in Pain?  Yes    Pain Score  7     Pain Location  Hip    Pain Onset  More than a month ago          TREATMENT  MT Sidelying over R with RLE flexed and LLE in neutral over bolster: Belt-assisted mobilization for LLE hip P-A grade 3 x 40 with positional adjustments for patient comfort PROM hip flexor stretch x 30 sec Active hip flexor stretch x 10  Supine: MWM hip distraction, sustained inferior glide, and flexion for hip mobilization x 10 Lateral hip distraction x 30 oscillations  MT for increased hip ROM and decreased muscular tightness around LLE hip   TE R sidelying isometric "ball squish" hip extension 5 sec hold 2 x 10 with cues for pelvic alignment - pt c/o cramping with HS and HS compensation evident, reduced with increased knee flexion angle with concurrent improvement in glute activation Thomas stretch with gentle active quad ROM x 10 reps Figure 4 stretch 3 x 30 sec Mini squats with posterior weight shift and isometric engagement x 10 - pt apprehensive due to wt shift but complies with therapist to rear for support Standing LLE extension with LLE on slider with hips against treatment table to reduce ATP moment x 10 - compensation remains Standing hip extension with UE support x 10, with 5 sec hold x 10 - multimodal cues for hip dissociation  TE for gluteal strengthening, increased hip ROM, and pain relief          PT Education - 12/20/19 1300    Education provided  Yes    Education Details  form/technique with exercise    Person(s) Educated  Patient    Methods  Demonstration;Explanation;Tactile cues;Verbal cues    Comprehension  Verbalized understanding;Returned demonstration;Tactile cues  required;Verbal cues required       PT Short Term Goals - 09/12/19 1419      PT SHORT TERM GOAL #1   Title  Patient will demonstrate complaince with HEP to speed recovery and reduce total number of visits.    Baseline  HEP given    Time  2    Period  Weeks    Status  New    Target Date  09/27/19        PT Long Term Goals - 12/07/19 [id number]      PT LONG TERM GOAL #1   Title  Patient will improve hip strength to 4/5 grossly to facilitate improved mobility with ADLs.    Baseline  3, 4-; 12/22 deferred to NV; 11/09/2019: 4-/5; 12/07/2019 L hip flexor 4, abd and ext 3+    Time  8  Period  Weeks    Status  On-going    Target Date  01/18/20      PT LONG TERM GOAL #2   Title  Patient will demonstrate walking tolerance increased to 30 minutes without increase in symptoms to demonstrate improved ability to perform walking program for cardiovascular health.    Baseline  10 minute tolerance (patient report); 12/22 10 minutes (per pt report); 11/09/2019: 7 min; 12/07/2019 unable to assess due to icy streets    Time  8    Period  Weeks    Status  Unable to assess    Target Date  01/18/20      PT LONG TERM GOAL #3   Title  Patient will report worst pain of 3/10 during therapy to demonstrate reduced disability    Baseline  Worst 7/10; 12/22 worst 9/10; 11/09/2019: 9/10; 12/07/2019 7/10    Time  8    Period  Weeks    Status  On-going    Target Date  01/18/20      PT LONG TERM GOAL #4   Title  Patient will demonstrate ability to perform all bed mobility maneuvers without increase in pain for reduced disability with mobility and transfers with ADLs.    Baseline  Reports pain with bed mobility supine <> sidelying L/R, sidelying to sitting EOB; 12/22 pt able to perform I without increased pain    Time  8    Period  Weeks    Status  Achieved    Target Date  01/18/20      PT LONG TERM GOAL #5   Title  Patient will improve 10MW speed to 1.0 m/sec to meet norms for minimal fall risk and full community  ambulation.    Baseline  0.88 m/sec; 12/22 0.96 m/sec; 11/09/2019: deferred; 12/07/2019 0.90 m/sec    Time  8    Period  Weeks    Status  On-going    Target Date  01/18/20      PT LONG TERM GOAL #6   Title  Patient will increase 6MWT by 200 ft to demonstrate improvement in functional capacity for transition to cardiac walking program    Baseline  6MWT = 1195; 1/7/20201: deferred; 12/07/2019 1095    Time  8    Period  Weeks    Status  On-going    Target Date  01/18/20            Plan - 12/20/19 1821    Clinical Impression Statement  Patient tolerates mobilization of LLE hip through manual techniques and stretches well but objective improvement is not yet evident. Note hamstrings compensation with sidelying hip extension therex, moderately improved with taking LLE knee into increased flexion for active insufficiency of HS although pt does c/o "cramping". Patient is able to tolerate isometric mini-squat therex for posterior chain activation but reports apprehension with posterior weight shift. Lumbopelvic and pelvic/hip dissociation remains challenging with cues. Patient c/o pain over L thigh persists which may be concerning for delayed healing at this time; pt advised to f/u with MD. Patient will benefit from skilled physical therapy to reduce BLE hip pain and facilitate commencement of walking program for improved general health.    Personal Factors and Comorbidities  Age;Comorbidity 3+;Time since onset of injury/illness/exacerbation;Comorbidity 2    Comorbidities  MCI, HTN, CA, cataracts    Examination-Activity Limitations  Bend;Sit;Squat;Stairs;Stand;Sleep    Examination-Participation Restrictions  Cleaning;Community Activity    Stability/Clinical Decision Making  Evolving/Moderate complexity    Rehab Potential  Fair    Clinical Impairments Affecting Rehab Potential  MCI    PT Frequency  2x / week   Eval and 1 f/u visit   PT Treatment/Interventions  ADLs/Self Care Home  Management;Therapeutic exercise;Patient/family education;Neuromuscular re-education;Therapeutic activities;Functional mobility training;Balance training;Manual techniques;Gait training;Stair training;Moist Heat;Electrical Stimulation;Cryotherapy;Taping;Dry needling;Energy conservation;Passive range of motion;Joint Manipulations    PT Next Visit Plan  Progress glute therex, pelvic-hip dissociation; check and progress HEP with stretches    PT Home Exercise Plan  lateral step ups, step ups, hip kickers ext/abd red theraband    Consulted and Agree with Plan of Care  Patient       Patient will benefit from skilled therapeutic intervention in order to improve the following deficits and impairments:  Decreased range of motion, Postural dysfunction, Pain, Abnormal gait, Decreased activity tolerance, Decreased coordination, Decreased endurance, Decreased balance, Decreased mobility, Difficulty walking, Hypomobility, Increased muscle spasms, Impaired perceived functional ability, Decreased strength, Impaired flexibility  Visit Diagnosis: Pain in left hip  Pain in right hip  Difficulty in walking, not elsewhere classified     Problem List Patient Active Problem List   Diagnosis Date Noted  . Bipolar disorder, in full remission, most recent episode depressed ([place]) 11/08/2019  . Avitaminosis D 08/24/2019  . Insomnia due to mental condition 05/31/2019  . Bipolar I disorder, most recent episode depressed ([place]) 05/31/2019  . Mild cognitive disorder 05/31/2019  . Alcohol use disorder, moderate, in sustained remission ([place]) 05/31/2019  . Elevated tumor markers 05/27/2019  . GAD (generalized anxiety disorder) 04/10/2019  . Altered mental status 04/10/2019  . MDD (major depressive disorder), severe ([place]) 01/31/2019  . High serum carbohydrate antigen 19-9 (CA19-9) 12/01/2018  . Adjustment disorder with anxious mood 11/24/2018  . Essential hypertension 11/24/2018  . Screening for lipid disorders  11/24/2018  . B12 deficiency 10/17/2018  . Incisional hernia, without obstruction or gangrene 09/12/2018  . Genetic testing 04/07/2018  . Family history of breast cancer 03/24/2018  . Family history of lung cancer 03/24/2018  . Malignant neoplasm of upper-outer quadrant of right breast in female, estrogen receptor positive ([place]) 03/17/2018  . Malignant neoplasm of upper-inner quadrant of right breast in female, estrogen receptor positive ([place]) 03/17/2018  . History of alcohol dependence ([facility]) 02/07/2018  . History of psychosis 02/07/2018  . History of insomnia 02/07/2018    Virgia Land, SPT 12/20/2019, 6:34 PM Merdis Delay, PT, DPT    [facility] [name], [place], [address] Phone: [phone]   Fax:  [phone]  Name: Kendra [name] MRN: [id number] Date of Birth: [birth year]

## 2019-12-21 ENCOUNTER — Other Ambulatory Visit: Payer: Medicare PPO

## 2019-12-21 NOTE — Progress Notes (Signed)
[facility]  [address] [place], [place] [address] Phone: [phone]  Fax: [phone]   Telemedicine Office Visit:  12/25/2019  Referring physician: Virginia Crews, MD  I connected with Kendra [name] on 12/25/2019 at 11:57 AM by videoconferencing and verified that I was speaking with the correct person using 2 identifiers.  The patient was at home.  I discussed the limitations, risk, security and privacy concerns of performing an evaluation and management service by videoconferencing and the availability of in person appointments.  I also discussed with the patient that there may be a patient responsible charge related to this service.  The patient expressed understanding and agreed to proceed.   Chief Complaint: Kendra [name] is a 74 y.o. female with multifocal stage IA right breast cancerand B12 deficiency who is seen for a 4 month assessment.   HPI: The patient was last seen in the medical oncology clinic on 08/28/2019. At that time, she was doing well. Exam revealed tender right breast. CBC was normal. Sodium was 134. CA 27.29 was 19.0 (normal). She remained on oral B12. She continued on Femara 2.5 mg a day.   Patient underwent left (08/30/2019) and right (09/20/2019) cataracts surgery with Dr. Leandrew [name].  She was seen by Dr. Lavon Paganini on 10/11/2019. She had ongoing bilateral hip pain. She was advised to continue physical therapy. She dicussed ice and NSAIDs with the patient. If patient had no relief from the injections and continued PT Dr. Brita Romp would refer her to Ortho. Patient received an injection.   She had a virtual visit with Dr. Lavon Paganini on 11/27/2019. She reported a mechanical fall on 11/22/2019. She feel onto her left side from standing. She had a bruise on her left shoulder, elbow, wrist, thigh, and knee. She had trouble bearing weight on left leg. She was referred to orthopedics because of her  chronic hip pain 1 week prior to visit. She was advised to continue physical therapy. Multiple x-rays were ordered.  Labs on 12/22/2019 revealed a hematocrit 37.0, hemoglobin 12.3, platelets 214,000, WBC 5,100. Sodium 129. CA 19-9 was 50. CA 27.29 was 26.5.   During the interim, she has been doing good. Her cousin has been giving her B12 injections for the past year. She remains on Femara. She performs breast exams in the shower or when she thinks about it. She has no breast concerns.   She has bilateral hip pain s/p her fall on 11/22/2019.  I recommended a follow-up with orthopedics if pain persists.  A steroid injection do not improve pain. She is drinking water. Her sodium is 129 and I encouraged her to drink fluids with electrolytes and increase her salt in diet.   She would like the next mammogram and bone density study to be done at the same time.    Past Medical History:  Diagnosis Date  . Anxiety   . Breast cancer ([place]) 03/2018   right breast cancer at 11:00 and 1:00  . Bursitis    bilateral hips and knees  . Complication of anesthesia   . Depression   . Family history of breast cancer 03/24/2018  . Family history of lung cancer 03/24/2018  . History of alcohol dependence ([place]) 2007   no ETOH; resolved since 2007  . History of hiatal hernia   . History of psychosis 2014   due to sleep disturbance  . Hypertension   . Personal history of radiation therapy 06/2018-07/2018   right breast ca  .  PONV (postoperative nausea and vomiting)     Past Surgical History:  Procedure Laterality Date  . BREAST BIOPSY Right 03/14/2018   11:00 DCIS and invasive ductal carcinoma  . BREAST BIOPSY Right 03/14/2018   1:00 Invasive ductal carcinoma  . BREAST LUMPECTOMY Right 04/27/2018   lumpectomy of 11 and 1:00 cancers, clear margins, negative LN  . BREAST LUMPECTOMY WITH RADIOACTIVE SEED AND SENTINEL LYMPH NODE BIOPSY Right 04/27/2018   Procedure: RIGHT BREAST LUMPECTOMY WITH RADIOACTIVE SEED  X 2 AND RIGHT SENTINEL LYMPH NODE BIOPSY;  Surgeon: [name], MD;  Location: [facility];  Service: General;  Laterality: Right;  . CATARACT EXTRACTION W/PHACO Left 08/30/2019   Procedure: CATARACT EXTRACTION PHACO AND INTRAOCULAR LENS PLACEMENT ([place]) LEFT panoptix toric  01:20.5  12.7%  10.26;  Surgeon: Kendra Koyanagi, MD;  Location: [facility];  Service: Ophthalmology;  Laterality: Left;  . CATARACT EXTRACTION W/PHACO Right 09/20/2019   Procedure: CATARACT EXTRACTION PHACO AND INTRAOCULAR LENS PLACEMENT ([facility]) RIGHT 5.71  00:36.4  15.7%;  Surgeon: Kendra Koyanagi, MD;  Location: [facility];  Service: Ophthalmology;  Laterality: Right;  . LAPAROTOMY N/A 02/10/2018   Procedure: EXPLORATORY LAPAROTOMY;  Surgeon: Isabel Caprice, MD;  Location: [facility];  Service: Gynecology;  Laterality: N/A;  . MASS EXCISION  01/2018   abdominal  . OVARIAN CYST REMOVAL    . SALPINGOOPHORECTOMY Bilateral 02/10/2018   Procedure: BILATERAL SALPINGO OOPHORECTOMY; PERITONEAL WASHINGS;  Surgeon: Isabel Caprice, MD;  Location: [facility];  Service: Gynecology;  Laterality: Bilateral;  . TONSILLECTOMY    . TONSILLECTOMY AND ADENOIDECTOMY  1952    Family History  Problem Relation Age of Onset  . Diabetes Mother   . Breast cancer Mother 64  . Hypertension Mother   . Lung cancer Father        asbestos exposure  . Heart disease Brother 1  . Breast cancer Cousin 89       paternal cousin  . Heart disease Paternal Grandmother 64  . Heart disease Paternal Grandfather     Social History:  reports that she has never smoked. She has never used smokeless tobacco. She reports previous alcohol use. She reports that she does not use drugs. She is married to [name]. They live in [place]. The patient is accompanied by Kendra [name] today.  Participants in the patient's visit and their role in the encounter included the patient, Kendra [name], and [employer], Therapist, sports, today.   The intake visit was provided by Waymon Budge, RN.  Allergies:  Allergies  Allergen Reactions  . Hydroxyzine Anaphylaxis    Tongue swollen  . Shrimp [Shellfish Allergy] Other (See Comments)    Food poisoning     Current Medications: Current Outpatient Medications  Medication Sig Dispense Refill  . calcium citrate-vitamin D (CITRACAL+D) 315-200 MG-UNIT tablet Take 4 tablets by mouth daily.    . cyanocobalamin (,VITAMIN B-12,) 1000 MCG/ML injection Inject 1 mL (1,000 mcg total) into the muscle every 30 (thirty) days. 3 mL 3  . FLUoxetine HCl 60 MG TABS Take 60 mg by mouth daily. 90 tablet 1  . letrozole (FEMARA) 2.5 MG tablet Take 1 tablet (2.5 mg total) by mouth daily. 90 tablet 3  . lisinopril-hydrochlorothiazide (ZESTORETIC) 20-12.5 MG tablet Take 1 tablet by mouth daily. 90 tablet 3  . polyethylene glycol (MIRALAX / GLYCOLAX) 17 g packet Take 17 g by mouth daily.    . risperiDONE (RISPERDAL) 3 MG tablet Take 0.5 tablets (1.5 mg total) by mouth 2 (  two) times daily. 90 tablet 1  . traZODone (DESYREL) 50 MG tablet Take 0.5-1 tablets (25-50 mg total) by mouth at bedtime as needed for sleep. 90 tablet 1   Current Facility-Administered Medications  Medication Dose Route Frequency Provider Last Rate Last Admin  . lidocaine (PF) (XYLOCAINE) 1 % injection 4 mL  4 mL Intradermal Once Kendra Crews, MD      . lidocaine (PF) (XYLOCAINE) 1 % injection 4 mL  4 mL Intradermal Once Kendra Crews, MD      . methylPREDNISolone acetate (DEPO-MEDROL) injection 40 mg  40 mg Intramuscular Once Kendra Crews, MD      . methylPREDNISolone acetate (DEPO-MEDROL) injection 40 mg  40 mg Intramuscular Once Kendra Crews, MD         Review of Systems  Constitutional: Negative for chills, diaphoresis, fever, malaise/fatigue and weight loss.       Feels "good".  HENT: Negative.  Negative for congestion, ear pain, hearing loss, nosebleeds, sinus pain and sore throat.   Eyes:  Negative.  Negative for blurred vision, double vision, photophobia and redness.  Respiratory: Negative.  Negative for cough, sputum production, shortness of breath and wheezing.   Cardiovascular: Negative.  Negative for chest pain, palpitations, leg swelling and PND.  Gastrointestinal: Negative.  Negative for abdominal pain, blood in stool, constipation, diarrhea, heartburn, nausea and vomiting.  Genitourinary: Negative.  Negative for dysuria, frequency, hematuria and urgency.  Musculoskeletal: Positive for falls (11/22/2019) and joint pain (bursitis in hip). Negative for back pain, myalgias and neck pain.  Skin: Negative.  Negative for itching and rash.  Neurological: Negative for dizziness, sensory change, speech change, focal weakness, weakness (balance is good) and headaches.       Balance "improved".  Endo/Heme/Allergies: Does not bruise/bleed easily.       Vasomotor symptoms "come and go".   Psychiatric/Behavioral: Positive for memory loss. Negative for depression. The patient is not nervous/anxious and does not have insomnia.   All other systems reviewed and are negative.   Performance status (ECOG): 1  Physical Exam  Constitutional: She is oriented to person, place, and time. She appears well-developed and well-nourished. No distress.  HENT:  Head: Normocephalic and atraumatic.  Shoulder length blonde hair.  Eyes: Conjunctivae and EOM are normal. No scleral icterus.  Blue eyes.  Neurological: She is alert and oriented to person, place, and time.  Skin: She is not diaphoretic.  Psychiatric: She has a normal mood and affect. Her behavior is normal. Judgment and thought content normal.  Nursing note reviewed.    No visits with results within 3 Day(s) from this visit.  Latest known visit with results is:  Appointment on 12/22/2019  Component Date Value Ref Range Status  . WBC 12/22/2019 5.1  4.0 - 10.5 K/uL Final  . RBC 12/22/2019 4.34  3.87 - 5.11 MIL/uL Final  . Hemoglobin  12/22/2019 12.3  12.0 - 15.0 g/dL Final  . HCT 12/22/2019 37.0  36.0 - 46.0 % Final  . MCV 12/22/2019 85.3  80.0 - 100.0 fL Final  . MCH 12/22/2019 28.3  26.0 - 34.0 pg Final  . MCHC 12/22/2019 33.2  30.0 - 36.0 g/dL Final  . RDW 12/22/2019 14.2  11.5 - 15.5 % Final  . Platelets 12/22/2019 214  150 - 400 K/uL Final  . nRBC 12/22/2019 0.0  0.0 - 0.2 % Final  . Neutrophils Relative % 12/22/2019 61  % Final  . Neutro Abs 12/22/2019 3.2  1.7 - 7.7  K/uL Final  . Lymphocytes Relative 12/22/2019 21  % Final  . Lymphs Abs 12/22/2019 1.1  0.7 - 4.0 K/uL Final  . Monocytes Relative 12/22/2019 11  % Final  . Monocytes Absolute 12/22/2019 0.6  0.1 - 1.0 K/uL Final  . Eosinophils Relative 12/22/2019 5  % Final  . Eosinophils Absolute 12/22/2019 0.2  0.0 - 0.5 K/uL Final  . Basophils Relative 12/22/2019 1  % Final  . Basophils Absolute 12/22/2019 0.0  0.0 - 0.1 K/uL Final  . Immature Granulocytes 12/22/2019 1  % Final  . Abs Immature Granulocytes 12/22/2019 0.03  0.00 - 0.07 K/uL Final   Performed at [facility], [address], [place], [place] [address]  . Sodium 12/22/2019 129* 135 - 145 mmol/L Final  . Potassium 12/22/2019 3.5  3.5 - 5.1 mmol/L Final  . Chloride 12/22/2019 96* 98 - 111 mmol/L Final  . CO2 12/22/2019 26  22 - 32 mmol/L Final  . Glucose, Bld 12/22/2019 115* 70 - 99 mg/dL Final  . BUN 12/22/2019 10  8 - 23 mg/dL Final  . Creatinine, Ser 12/22/2019 0.72  0.44 - 1.00 mg/dL Final  . Calcium 12/22/2019 9.1  8.9 - 10.3 mg/dL Final  . Total Protein 12/22/2019 6.7  6.5 - 8.1 g/dL Final  . Albumin 12/22/2019 3.9  3.5 - 5.0 g/dL Final  . AST 12/22/2019 22  15 - 41 U/L Final  . ALT 12/22/2019 25  0 - 44 U/L Final  . Alkaline Phosphatase 12/22/2019 47  38 - 126 U/L Final  . Total Bilirubin 12/22/2019 0.6  0.3 - 1.2 mg/dL Final  . GFR calc non Af Amer 12/22/2019 >60  >60 mL/min Final  . GFR calc Af Amer 12/22/2019 >60  >60 mL/min Final  . Anion gap 12/22/2019 7  5 - 15  Final   Performed at [facility], [address], [place], [place] [address]  . CA 19-9 12/22/2019 50* 0 - 35 U/mL Final   Comment: (NOTE) **Verified by repeat analysis** Roche Diagnostics Electrochemiluminescence Immunoassay (ECLIA) Values obtained with different assay methods or kits cannot be used interchangeably.  Results cannot be interpreted as absolute evidence of the presence or absence of malignant disease. Performed At: [facility], [place] [id number] Rush Farmer MD JO:[id number]   . CA 27.29 12/22/2019 26.5  0.0 - 38.6 U/mL Final   Comment: (NOTE) Siemens Centaur Immunochemiluminometric Methodology [facility]) Values obtained with different assay methods or kits cannot be used interchangeably. Results cannot be interpreted as absolute evidence of the presence or absence of malignant disease. Performed At: [facility] [place], [place] [id number] Rush Farmer MD WK:[id number]     Assessment:  Kendra [name] is a 74 y.o. female with multi-focal stage IA right breast cancers/p right lumpectomy on 04/27/2018. Pathology of the right lateral lesionrevealed a 2.5 cm grade II invasive ductal carcinoma with intermediate grade DCIS. The medial lesionrevealed a 1.1 cm grade II invasive ductal carcinoma with intermediate grade DCIS. LVIDS was present. Margins were negative. Zero of 2 lymph nodes were positive. Both tumors were ER + (100%), PR + (70%), and HER-2 negative. Ki-67 was 10%. Pathologic stage was mT2N0.  Oncotype DX revealed a recurrence score of 12 which translated to a distant recurrence of 3% at 9 years with an AI or tamxifen alone. The absolute benefit of chemotherpay was <1%.  Invitae testingon 03/23/2018 revealed a variant of uncertain significance in MSH6.   She received  breast radiationfrom 06/30/2018 - 07/20/2018. She began [facility] 09/06/2018. She is tolerating it  well.  Bilateral diagnostic mammogram on 06/12/2019 showed no evidence of malignancy in either breast.  CA27.29has been followed: 25.4 on 03/01/2019, 23.5 on 06/01/2019, 19.0 on 08/28/2019, and 26.5 on 12/22/2019.  She underwent exploratory laparotomy, bilateral salpingo-oophrectomy, and pelvic washings on 02/10/2018. 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 Peritoneal washings revealed atypical cells.   She has had elevated tumor markers.CA125has been followed (0-38.1):48.2on 03/27/2019and11.3 on 10/24/2018.CA19-9has been followed (0-35):322 on 01/26/2018,48 on 10/24/2018, 45 on 03/01/2019, and 50 on 12/22/2019.  Abdomen and pelvic CTon 02/04/2018 revealed a large (28.5 x 17 x 25 cm) cystic mass with enhancing septations and nodularity involving the majority of the abdomen. Abdomen and pelvic CTon 10/14/2018 revealed no acute findings. There was interval surgical resection of large cystic pelvic mass. There was no evidence of residual or metastatic neoplasm. There was suspected hepatic cirrhosis. There were three midline ventral abdominal wall hernias (small epigastric ventral hernia, moderate paraumbilical hernia, and small suprapubic ventral hernia).   She has a history ofelevated LFTs. AST was 50 on 10/06/2018. Hepatitis B and C serologies were negative.  Bone densityon 06/28/2018 was normal with a T score of -0.8 in the right femoral neck.  She has B12 deficiency. B12 was 232 on 08/09/2018 and 386 on 10/24/2018. She began B12 injectionson 08/15/2018.She receives B12 monthly at home.Anti-parietal antibody was elevated (23; 0-20) and intrinsic factor was normal on 10/24/2018.  Symptomatically, she feels good.  She denies any breast concerns.  Plan: 1.   Review labs from 12/22/2019.  2.Multifocal stage IA RIGHT breast cancer Clinically,she is doing well.  Exam on 08/28/2019 revealed no evidence of  recurrent disease.  CA27.29 was 26.5 (normal) on 12/22/2019. Bilateral diagnostic mammogram on 06/12/2019 showed no evidence of malignancy. Continue Femara. 3. B12 deficiency Patientreceives B12 injections.  She has pernicious anemia. folate was 7.4 on 10/24/2018.  Check folate annually. 4. Multilocular mucinous cystadenoma Benign pathology. Some atypical cells noted washings. Abdomen and pelvic CT scanswere negative on 10/14/2018. She has a history of increased markers(CA19-9 and CA125).   CA19-9 is stable. Continue to monitor. 5.   Health maintenance  Check bone density on aromatase inhibitor secondary to potential bone loss. 6.   Bone density 06/28/2020. 7.   Bilateral mammogram 06/28/2020. 8.   RTC in 4 months for MD assessment, labs (CBC with diff, CMP, CA27.29, CA19-9, CA125, folate).  I discussed the assessment and treatment plan with the patient.  The patient was provided an opportunity to ask questions and all were answered.  The patient agreed with the plan and demonstrated an understanding of the instructions.  The patient was advised to call back or seek an in person evaluation if the symptoms worsen or if the condition fails to improve as anticipated.  I provided 20 minutes (11:57 AM - 12:17 PM) of face-to-face video visit time during this this encounter and > 50% was spent counseling as documented under my assessment and plan.  I provided these services from the [facility] office.   Nolon Stalls, MD, PhD  12/25/2019, 11:57 AM  I, Selena Batten, am acting as scribe for [employer]. Mike Gip, MD, PhD.  I, Ottavio Norem C. Mike Gip, MD, have reviewed the above documentation for accuracy and completeness, and I agree with the above.

## 2019-12-22 ENCOUNTER — Inpatient Hospital Stay: Payer: Medicare PPO

## 2019-12-22 ENCOUNTER — Inpatient Hospital Stay: Payer: Medicare PPO | Attending: Hematology and Oncology

## 2019-12-22 ENCOUNTER — Other Ambulatory Visit: Payer: Medicare PPO

## 2019-12-22 ENCOUNTER — Other Ambulatory Visit: Payer: Self-pay

## 2019-12-22 DIAGNOSIS — Z170 Estrogen receptor positive status [ER+]: Secondary | ICD-10-CM | POA: Diagnosis not present

## 2019-12-22 DIAGNOSIS — C50411 Malignant neoplasm of upper-outer quadrant of right female breast: Secondary | ICD-10-CM | POA: Insufficient documentation

## 2019-12-22 LAB — COMPREHENSIVE METABOLIC PANEL
ALT: 25 U/L (ref 0–44)
AST: 22 U/L (ref 15–41)
Albumin: 3.9 g/dL (ref 3.5–5.0)
Alkaline Phosphatase: 47 U/L (ref 38–126)
Anion gap: 7 (ref 5–15)
BUN: 10 mg/dL (ref 8–23)
CO2: 26 mmol/L (ref 22–32)
Calcium: 9.1 mg/dL (ref 8.9–10.3)
Chloride: 96 mmol/L — ABNORMAL LOW (ref 98–111)
Creatinine, Ser: 0.72 mg/dL (ref 0.44–1.00)
GFR calc Af Amer: >60 mL/min (ref >60)
GFR calc non Af Amer: >60 mL/min (ref >60)
Glucose, Bld: 115 mg/dL — ABNORMAL HIGH (ref 70–99)
Potassium: 3.5 mmol/L (ref 3.5–5.1)
Sodium: 129 mmol/L — ABNORMAL LOW (ref 135–145)
Total Bilirubin: 0.6 mg/dL (ref 0.3–1.2)
Total Protein: 6.7 g/dL (ref 6.5–8.1)

## 2019-12-22 LAB — CBC WITH DIFFERENTIAL/PLATELET
Abs Immature Granulocytes: 0.03 10*3/uL (ref 0.00–0.07)
Basophils Absolute: 0 10*3/uL (ref 0.0–0.1)
Basophils Relative: 1 %
Eosinophils Absolute: 0.2 10*3/uL (ref 0.0–0.5)
Eosinophils Relative: 5 %
HCT: 37 % (ref 36.0–46.0)
Hemoglobin: 12.3 g/dL (ref 12.0–15.0)
Immature Granulocytes: 1 %
Lymphocytes Relative: 21 %
Lymphs Abs: 1.1 10*3/uL (ref 0.7–4.0)
MCH: 28.3 pg (ref 26.0–34.0)
MCHC: 33.2 g/dL (ref 30.0–36.0)
MCV: 85.3 fL (ref 80.0–100.0)
Monocytes Absolute: 0.6 10*3/uL (ref 0.1–1.0)
Monocytes Relative: 11 %
Neutro Abs: 3.2 10*3/uL (ref 1.7–7.7)
Neutrophils Relative %: 61 %
Platelets: 214 10*3/uL (ref 150–400)
RBC: 4.34 MIL/uL (ref 3.87–5.11)
RDW: 14.2 % (ref 11.5–15.5)
WBC: 5.1 10*3/uL (ref 4.0–10.5)
nRBC: 0 % (ref 0.0–0.2)

## 2019-12-23 LAB — CANCER ANTIGEN 27.29: CA 27.29: 26.5 U/mL (ref 0.0–38.6)

## 2019-12-23 LAB — CANCER ANTIGEN 19-9: CA 19-9: 50 U/mL — ABNORMAL HIGH (ref 0–35)

## 2019-12-24 DIAGNOSIS — D279 Benign neoplasm of unspecified ovary: Secondary | ICD-10-CM | POA: Insufficient documentation

## 2019-12-25 ENCOUNTER — Encounter: Payer: Self-pay | Admitting: Hematology and Oncology

## 2019-12-25 ENCOUNTER — Inpatient Hospital Stay (HOSPITAL_BASED_OUTPATIENT_CLINIC_OR_DEPARTMENT_OTHER): Payer: Medicare PPO | Admitting: Hematology and Oncology

## 2019-12-25 ENCOUNTER — Other Ambulatory Visit: Payer: Medicare Other

## 2019-12-25 DIAGNOSIS — R978 Other abnormal tumor markers: Secondary | ICD-10-CM

## 2019-12-25 DIAGNOSIS — Z170 Estrogen receptor positive status [ER+]: Secondary | ICD-10-CM | POA: Diagnosis not present

## 2019-12-25 DIAGNOSIS — C50411 Malignant neoplasm of upper-outer quadrant of right female breast: Secondary | ICD-10-CM

## 2019-12-25 DIAGNOSIS — C50211 Malignant neoplasm of upper-inner quadrant of right female breast: Secondary | ICD-10-CM | POA: Diagnosis not present

## 2019-12-25 DIAGNOSIS — D279 Benign neoplasm of unspecified ovary: Secondary | ICD-10-CM

## 2019-12-25 DIAGNOSIS — E538 Deficiency of other specified B group vitamins: Secondary | ICD-10-CM | POA: Diagnosis not present

## 2019-12-25 NOTE — Progress Notes (Signed)
Confirmed name and DOB. Patient has concerns with lab work. Denies any other concerns at this time.

## 2019-12-26 ENCOUNTER — Ambulatory Visit: Payer: Medicare PPO

## 2019-12-26 ENCOUNTER — Other Ambulatory Visit: Payer: Self-pay

## 2019-12-26 DIAGNOSIS — R262 Difficulty in walking, not elsewhere classified: Secondary | ICD-10-CM | POA: Diagnosis not present

## 2019-12-26 DIAGNOSIS — M25552 Pain in left hip: Secondary | ICD-10-CM | POA: Diagnosis not present

## 2019-12-26 DIAGNOSIS — M25622 Stiffness of left elbow, not elsewhere classified: Secondary | ICD-10-CM | POA: Diagnosis not present

## 2019-12-26 DIAGNOSIS — M6281 Muscle weakness (generalized): Secondary | ICD-10-CM | POA: Diagnosis not present

## 2019-12-26 DIAGNOSIS — M25551 Pain in right hip: Secondary | ICD-10-CM

## 2019-12-26 DIAGNOSIS — M25522 Pain in left elbow: Secondary | ICD-10-CM | POA: Diagnosis not present

## 2019-12-27 NOTE — Therapy (Signed)
[facility] [address] [place], [address] Phone: [phone]   Fax:  [phone]  Physical Therapy Treatment  Patient Details  Name: Kendra [name] MRN: [id number] Date of Birth: [birth year] No data recorded  Encounter Date: 12/26/2019  PT End of Session - 12/27/19 1523    Visit Number  24    Number of Visits  30    Date for PT Re-Evaluation  01/18/20    Authorization Type  5/10    PT Start Time  1515    PT Stop Time  1600    PT Time Calculation (min)  45 min    Equipment Utilized During Treatment  Gait belt    Activity Tolerance  Patient tolerated treatment well;No increased pain    Behavior During Therapy  Flat affect       Past Medical History:  Diagnosis Date  . Anxiety   . Breast cancer ([facility]) 03/2018   right breast cancer at 11:00 and 1:00  . Bursitis    bilateral hips and knees  . Complication of anesthesia   . Depression   . Family history of breast cancer 03/24/2018  . Family history of lung cancer 03/24/2018  . History of alcohol dependence ([facility]) 2007   no ETOH; resolved since 2007  . History of hiatal hernia   . History of psychosis 2014   due to sleep disturbance  . Hypertension   . Personal history of radiation therapy 06/2018-07/2018   right breast ca  . PONV (postoperative nausea and vomiting)     Past Surgical History:  Procedure Laterality Date  . BREAST BIOPSY Right 03/14/2018   11:00 DCIS and invasive ductal carcinoma  . BREAST BIOPSY Right 03/14/2018   1:00 Invasive ductal carcinoma  . BREAST LUMPECTOMY Right 04/27/2018   lumpectomy of 11 and 1:00 cancers, clear margins, negative LN  . BREAST LUMPECTOMY WITH RADIOACTIVE SEED AND SENTINEL LYMPH NODE BIOPSY Right 04/27/2018   Procedure: RIGHT BREAST LUMPECTOMY WITH RADIOACTIVE SEED X 2 AND RIGHT SENTINEL LYMPH NODE BIOPSY;  Surgeon: [name], MD;  Location: [facility];  Service: General;   Laterality: Right;  . CATARACT EXTRACTION W/PHACO Left 08/30/2019   Procedure: CATARACT EXTRACTION PHACO AND INTRAOCULAR LENS PLACEMENT ([place]) LEFT panoptix toric  01:20.5  12.7%  10.26;  Surgeon: Leandrew Koyanagi, MD;  Location: [facility];  Service: Ophthalmology;  Laterality: Left;  . CATARACT EXTRACTION W/PHACO Right 09/20/2019   Procedure: CATARACT EXTRACTION PHACO AND INTRAOCULAR LENS PLACEMENT ([facility]) RIGHT 5.71  00:36.4  15.7%;  Surgeon: Leandrew Koyanagi, MD;  Location: [facility];  Service: Ophthalmology;  Laterality: Right;  . LAPAROTOMY N/A 02/10/2018   Procedure: EXPLORATORY LAPAROTOMY;  Surgeon: Isabel Caprice, MD;  Location: [facility];  Service: Gynecology;  Laterality: N/A;  . MASS EXCISION  01/2018   abdominal  . OVARIAN CYST REMOVAL    . SALPINGOOPHORECTOMY Bilateral 02/10/2018   Procedure: BILATERAL SALPINGO OOPHORECTOMY; PERITONEAL WASHINGS;  Surgeon: Isabel Caprice, MD;  [facility];  Service: Gynecology;  Laterality: Bilateral;  . TONSILLECTOMY    . TONSILLECTOMY AND ADENOIDECTOMY  1952    There were no vitals filed for this visit.  Subjective Assessment - 12/27/19 1342    Subjective  Patient states she continues to have increased pain along her R side of her glute which she states comes in waves of pain.    Pertinent History  Patient report unclear regarding onset. Visited MD on Sept 16  and received injection with relief for approximately 1 month. Reports she cannot receive an additional injection pending eye surgery. Received dx from MD of hip bursitis in B hips. Reports pain with walking, stanindg, sitting 5/10 current 7/10 worst 3/10 best, with temporary relief when she is sitting "just right" and with naproxen. Agg: activity. Cormorbidities: hx of breast cancer, HTN, MCI, insomnia, cataracts    Limitations  Sitting;Standing;Walking;House hold activities    How long can you sit comfortably?  Pain constant, reduced with neutral pelvic tilt     How long can you stand comfortably?  Pain constant, increases with time    How long can you walk comfortably?  Pain constant, increases with time    Patient Stated Goals  Be able to move without hurting, walk 1 hour    Currently in Pain?  Yes    Pain Score  0-No pain   7/10 when moving   Pain Location  Hip    Pain Orientation  Right;Left    Pain Descriptors / Indicators  Aching    Pain Type  Chronic pain    Pain Onset  More than a month ago    Pain Frequency  Intermittent       TREATMENT Therapeutic Exercise Pelvic Tilts in sitting - 2 x 10  Glute squeezes in standing - 2 x 10  Standing marches with UE support - x 20 Resisted hip extension in sitting - x 10 Standing hip extension in standing with cueing to maintain neutral - x5 5sec  Sit to stands with YTB - x 10  Performed exercises to decrease pain and improve strength   PT Education - 12/27/19 1522    Education provided  Yes    Education Details  form/technique with exercise    Person(s) Educated  Patient    Methods  Explanation;Demonstration    Comprehension  Verbalized understanding;Returned demonstration       PT Short Term Goals - 09/12/19 1419      PT SHORT TERM GOAL #1   Title  Patient will demonstrate complaince with HEP to speed recovery and reduce total number of visits.    Baseline  HEP given    Time  2    Period  Weeks    Status  New    Target Date  09/27/19        PT Long Term Goals - 12/07/19 [id number]      PT LONG TERM GOAL #1   Title  Patient will improve hip strength to 4/5 grossly to facilitate improved mobility with ADLs.    Baseline  3, 4-; 12/22 deferred to NV; 11/09/2019: 4-/5; 12/07/2019 L hip flexor 4, abd and ext 3+    Time  8    Period  Weeks    Status  On-going    Target Date  01/18/20      PT LONG TERM GOAL #2   Title  Patient will demonstrate walking tolerance increased to 30 minutes without increase in symptoms to demonstrate improved ability to perform walking program for  cardiovascular health.    Baseline  10 minute tolerance (patient report); 12/22 10 minutes (per pt report); 11/09/2019: 7 min; 12/07/2019 unable to assess due to icy streets    Time  8    Period  Weeks    Status  Unable to assess    Target Date  01/18/20      PT LONG TERM GOAL #3   Title  Patient will report worst pain of 3/10  during therapy to demonstrate reduced disability    Baseline  Worst 7/10; 12/22 worst 9/10; 11/09/2019: 9/10; 12/07/2019 7/10    Time  8    Period  Weeks    Status  On-going    Target Date  01/18/20      PT LONG TERM GOAL #4   Title  Patient will demonstrate ability to perform all bed mobility maneuvers without increase in pain for reduced disability with mobility and transfers with ADLs.    Baseline  Reports pain with bed mobility supine <> sidelying L/R, sidelying to sitting EOB; 12/22 pt able to perform I without increased pain    Time  8    Period  Weeks    Status  Achieved    Target Date  01/18/20      PT LONG TERM GOAL #5   Title  Patient will improve 10MW speed to 1.0 m/sec to meet norms for minimal fall risk and full community ambulation.    Baseline  0.88 m/sec; 12/22 0.96 m/sec; 11/09/2019: deferred; 12/07/2019 0.90 m/sec    Time  8    Period  Weeks    Status  On-going    Target Date  01/18/20      PT LONG TERM GOAL #6   Title  Patient will increase 6MWT by 200 ft to demonstrate improvement in functional capacity for transition to cardiac walking program    Baseline  6MWT = 1195; 1/7/20201: deferred; 12/07/2019 1095    Time  8    Period  Weeks    Status  On-going    Target Date  01/18/20            Plan - 12/27/19 1525    Clinical Impression Statement  Focused on activation of glute musculature during today's visit as patient demonstrates increased pain with transitioning and the pain was along the upper aspect of the iliac crest. Patient however has increased pain with resisted hip abduction in standing. Patient will benefit from further skilled  therapy to return to prior level of function.    Personal Factors and Comorbidities  Age;Comorbidity 3+;Time since onset of injury/illness/exacerbation;Comorbidity 2    Comorbidities  MCI, HTN, CA, cataracts    Examination-Activity Limitations  Bend;Sit;Squat;Stairs;Stand;Sleep    Examination-Participation Restrictions  Cleaning;Community Activity    Stability/Clinical Decision Making  Evolving/Moderate complexity    Rehab Potential  Fair    Clinical Impairments Affecting Rehab Potential  MCI    PT Frequency  2x / week   Eval and 1 f/u visit   PT Duration  6 weeks    PT Treatment/Interventions  ADLs/Self Care Home Management;Therapeutic exercise;Patient/family education;Neuromuscular re-education;Therapeutic activities;Functional mobility training;Balance training;Manual techniques;Gait training;Stair training;Moist Heat;Electrical Stimulation;Cryotherapy;Taping;Dry needling;Energy conservation;Passive range of motion;Joint Manipulations    PT Next Visit Plan  Progress glute therex, pelvic-hip dissociation; check and progress HEP with stretches    PT Home Exercise Plan  lateral step ups, step ups, hip kickers ext/abd red theraband    Consulted and Agree with Plan of Care  Patient       Patient will benefit from skilled therapeutic intervention in order to improve the following deficits and impairments:  Decreased range of motion, Postural dysfunction, Pain, Abnormal gait, Decreased activity tolerance, Decreased coordination, Decreased endurance, Decreased balance, Decreased mobility, Difficulty walking, Hypomobility, Increased muscle spasms, Impaired perceived functional ability, Decreased strength, Impaired flexibility  Visit Diagnosis: Muscle weakness (generalized)  Pain in left hip  Pain in right hip     Problem List Patient Active Problem  List   Diagnosis Date Noted  . 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 Bipolar disorder, in full remission, most recent episode depressed ([facility])  11/08/2019  . Avitaminosis D 08/24/2019  . Insomnia due to mental condition 05/31/2019  . Bipolar I disorder, most recent episode depressed ([facility]) 05/31/2019  . Mild cognitive disorder 05/31/2019  . Alcohol use disorder, moderate, in sustained remission ([facility]) 05/31/2019  . Elevated tumor markers 05/27/2019  . GAD (generalized anxiety disorder) 04/10/2019  . Altered mental status 04/10/2019  . MDD (major depressive disorder), severe ([facility]) 01/31/2019  . High serum carbohydrate antigen 19-9 (CA19-9) 12/01/2018  . Adjustment disorder with anxious mood 11/24/2018  . Essential hypertension 11/24/2018  . Screening for lipid disorders 11/24/2018  . B12 deficiency 10/17/2018  . Incisional hernia, without obstruction or gangrene 09/12/2018  . Genetic testing 04/07/2018  . Family history of breast cancer 03/24/2018  . Family history of lung cancer 03/24/2018  . Malignant neoplasm of upper-outer quadrant of right breast in female, estrogen receptor positive ([facility]) 03/17/2018  . Malignant neoplasm of upper-inner quadrant of right breast in female, estrogen receptor positive ([facility]) 03/17/2018  . History of alcohol dependence ([facility]) 02/07/2018  . History of psychosis 02/07/2018  . History of insomnia 02/07/2018    Blythe Stanford, PT DPT 12/27/2019, 3:33 PM  [facility] [address] [place], [address] Phone: [phone]   Fax:  [phone]  Name: Kendra [name] MRN: [id number] Date of Birth: 03/27/1946

## 2019-12-28 ENCOUNTER — Ambulatory Visit: Payer: Medicare PPO

## 2020-01-02 ENCOUNTER — Other Ambulatory Visit: Payer: Self-pay

## 2020-01-02 ENCOUNTER — Ambulatory Visit: Payer: Medicare PPO | Admitting: Licensed Clinical Social Worker

## 2020-01-02 ENCOUNTER — Ambulatory Visit: Payer: Medicare PPO | Admitting: Occupational Therapy

## 2020-01-04 ENCOUNTER — Other Ambulatory Visit: Payer: Self-pay

## 2020-01-04 ENCOUNTER — Ambulatory Visit (INDEPENDENT_AMBULATORY_CARE_PROVIDER_SITE_OTHER): Payer: Medicare PPO | Admitting: Psychiatry

## 2020-01-04 ENCOUNTER — Encounter: Payer: Self-pay | Admitting: Psychiatry

## 2020-01-04 DIAGNOSIS — F5105 Insomnia due to other mental disorder: Secondary | ICD-10-CM | POA: Diagnosis not present

## 2020-01-04 DIAGNOSIS — F09 Unspecified mental disorder due to known physiological condition: Secondary | ICD-10-CM

## 2020-01-04 DIAGNOSIS — F411 Generalized anxiety disorder: Secondary | ICD-10-CM | POA: Diagnosis not present

## 2020-01-04 DIAGNOSIS — S52515A Nondisplaced fracture of left radial styloid process, initial encounter for closed fracture: Secondary | ICD-10-CM | POA: Diagnosis not present

## 2020-01-04 DIAGNOSIS — F3176 Bipolar disorder, in full remission, most recent episode depressed: Secondary | ICD-10-CM | POA: Diagnosis not present

## 2020-01-04 MED ORDER — FLUOXETINE HCL 60 MG PO TABS: 60.0000 mg | ORAL_TABLET | Freq: Every day | ORAL | 1 refills | Status: DC

## 2020-01-04 MED ORDER — RISPERIDONE 3 MG PO TABS: 1.5000 mg | ORAL_TABLET | Freq: Two times a day (BID) | ORAL | 1 refills | Status: DC

## 2020-01-04 NOTE — Progress Notes (Signed)
Provider Location : [facility] Patient Location : Home  Virtual Visit via Telephone Note  I connected with Kendra [name] on 01/04/20 at  4:00 PM EST by telephone and verified that I am speaking with the correct person using two identifiers.   I discussed the limitations, risks, security and privacy concerns of performing an evaluation and management service by telephone and the availability of in person appointments. I also discussed with the patient that there may be a patient responsible charge related to this service. The patient expressed understanding and agreed to proceed.    I discussed the assessment and treatment plan with the patient. The patient was provided an opportunity to ask questions and all were answered. The patient agreed with the plan and demonstrated an understanding of the instructions.   The patient was advised to call back or seek an in-person evaluation if the symptoms worsen or if the condition fails to improve as anticipated.  [facility] MD OP Progress Note  01/04/2020 4:34 PM Kendra [name]  MRN:  [id number]  Chief Complaint:  Chief Complaint    Follow-up     HPI: Kendra [name] is a 74 year old Caucasian female, married, lives in [place], has a history of bipolar disorder, GAD, insomnia, cognitive disorder, multifocal stage I right breast cancer, status post treatment-lumpectomy, ovarian cyst removal was evaluated by phone today.  Patient was offered a video call however preferred to do a phone call.  Patient today reports she is currently struggling with health issues.  She had a recent fall in January 2021.  She reports since then she has been having left-sided hip joint pain and she also had a fracture of her left elbow.  She reports she is currently in physical therapy and following up with providers for the same.  She reports she is able to walk around without any help and currently does not use any support.  She also reports she no longer has to keep her arm in a sling  anymore.  She currently reports her pain is improving.  She reports mood wise she is doing okay.  She is currently on fluoxetine and risperidone and is compliant on them.  She denies side effects.  Patient denies suicidality, homicidality or perceptual disturbances.  Patient denies any other concerns today. Visit Diagnosis:    ICD-10-CM   1. Bipolar disorder, in full remission, most recent episode depressed ([facility])  F31.76 FLUoxetine HCl 60 MG TABS    risperiDONE (RISPERDAL) 3 MG tablet   stable  2. GAD (generalized anxiety disorder)  F41.1 FLUoxetine HCl 60 MG TABS    risperiDONE (RISPERDAL) 3 MG tablet  3. Insomnia due to mental condition  F51.05 FLUoxetine HCl 60 MG TABS  4. Mild cognitive disorder  F09     Past Psychiatric History: I have reviewed past psychiatric history from my progress note on 03/01/2019.  Past trials of Prozac, risperidone, Seroquel, Ativan  Past Medical History:  Past Medical History:  Diagnosis Date  . Anxiety   . Breast cancer ([facility]) 03/2018   right breast cancer at 11:00 and 1:00  . Bursitis    bilateral hips and knees  . Complication of anesthesia   . Depression   . Family history of breast cancer 03/24/2018  . Family history of lung cancer 03/24/2018  . History of alcohol dependence ([facility]) 2007   no ETOH; resolved since 2007  . History of hiatal hernia   . History of psychosis 2014   due to sleep disturbance  . Hypertension   .  Personal history of radiation therapy 06/2018-07/2018   right breast ca  . PONV (postoperative nausea and vomiting)     Past Surgical History:  Procedure Laterality Date  . BREAST BIOPSY Right 03/14/2018   11:00 DCIS and invasive ductal carcinoma  . BREAST BIOPSY Right 03/14/2018   1:00 Invasive ductal carcinoma  . BREAST LUMPECTOMY Right 04/27/2018   lumpectomy of 11 and 1:00 cancers, clear margins, negative LN  . BREAST LUMPECTOMY WITH RADIOACTIVE SEED AND SENTINEL LYMPH NODE BIOPSY Right 04/27/2018   Procedure: RIGHT  BREAST LUMPECTOMY WITH RADIOACTIVE SEED X 2 AND RIGHT SENTINEL LYMPH NODE BIOPSY;  Surgeon: [name], MD;  Location: [facility];  Service: General;  Laterality: Right;  . CATARACT EXTRACTION W/PHACO Left 08/30/2019   Procedure: CATARACT EXTRACTION PHACO AND INTRAOCULAR LENS PLACEMENT ([facility]) LEFT panoptix toric  01:20.5  12.7%  10.26;  Surgeon: Leandrew Koyanagi, MD;  Location: [facility];  Service: Ophthalmology;  Laterality: Left;  . CATARACT EXTRACTION W/PHACO Right 09/20/2019   Procedure: CATARACT EXTRACTION PHACO AND INTRAOCULAR LENS PLACEMENT ([facility]) RIGHT 5.71  00:36.4  15.7%;  Surgeon: Leandrew Koyanagi, MD;  Location: [facility];  Service: Ophthalmology;  Laterality: Right;  . LAPAROTOMY N/A 02/10/2018   Procedure: EXPLORATORY LAPAROTOMY;  Surgeon: Isabel Caprice, MD;  [facility];  Service: Gynecology;  Laterality: N/A;  . MASS EXCISION  01/2018   abdominal  . OVARIAN CYST REMOVAL    . SALPINGOOPHORECTOMY Bilateral 02/10/2018   Procedure: BILATERAL SALPINGO OOPHORECTOMY; PERITONEAL WASHINGS;  Surgeon: Isabel Caprice, MD;  [facility];  Service: Gynecology;  Laterality: Bilateral;  . TONSILLECTOMY    . TONSILLECTOMY AND ADENOIDECTOMY  1953    Family Psychiatric History: I have reviewed family psychiatric history from my progress note on 03/01/2019  Family History:  Family History  Problem Relation Age of Onset  . Diabetes Mother   . Breast cancer Mother 38  . Hypertension Mother   . Lung cancer Father        asbestos exposure  . Heart disease Brother 23  . Breast cancer Cousin 63       paternal cousin  . Heart disease Paternal Grandmother 21  . Heart disease Paternal Grandfather     Social History: Reviewed social history from my progress note on 03/01/2019 Social History   Socioeconomic History  . Marital status: Married    Spouse name: robert  . Number of children: 0  . Years of education: Not on file  .  Highest education level: Some college, no degree  Occupational History  . Occupation: retired Furniture conservator/restorer of education  Tobacco Use  . Smoking status: Never Smoker  . Smokeless tobacco: Never Used  Substance and Sexual Activity  . Alcohol use: Not Currently    Comment: alcohol dependence prior to 2007  . Drug use: Never  . Sexual activity: Yes    Partners: Male    Birth control/protection: Surgical  Other Topics Concern  . Not on file  Social History Narrative  . Not on file   Social Determinants of Health   Financial Resource Strain: Low Risk   . Difficulty of Paying Living Expenses: Not hard at all  Food Insecurity: No Food Insecurity  . Worried About Charity fundraiser in the Last Year: Never true  . Ran Out of Food in the Last Year: Never true  Transportation Needs: No Transportation Needs  . Lack of Transportation (Medical): No  . Lack of Transportation (Non-Medical):  No  Physical Activity: Sufficiently Active  . Days of Exercise per Week: 5 days  . Minutes of Exercise per Session: 40 min  Stress: Stress Concern Present  . Feeling of Stress : Very much  Social Connections: Unknown  . Frequency of Communication with Friends and Family: Not on file  . Frequency of Social Gatherings with Friends and Family: Not on file  . Attends Religious Services: More than 4 times per year  . Active Member of Clubs or Organizations: Yes  . Attends Archivist Meetings: 1 to 4 times per year  . Marital Status: Married    Allergies:  Allergies  Allergen Reactions  . Hydroxyzine Anaphylaxis    Tongue swollen  . Shrimp [Shellfish Allergy] Other (See Comments)    Food poisoning     Metabolic Disorder Labs: Lab Results  Component Value Date   HGBA1C 5.4 04/13/2019   Lab Results  Component Value Date   PROLACTIN 101.9 06/20/2015   Lab Results  Component Value Date   CHOL 173 04/13/2019   TRIG 78 04/13/2019   HDL 62 04/13/2019   LDLCALC 95  04/13/2019   LDLCALC 103 06/02/2017   Lab Results  Component Value Date   TSH 2.31 04/13/2019   TSH 2.543 07/25/2018    Therapeutic Level Labs: No results found for: LITHIUM No results found for: VALPROATE No components found for:  CBMZ  Current Medications: Current Outpatient Medications  Medication Sig Dispense Refill  . calcium citrate-vitamin D (CITRACAL+D) 315-200 MG-UNIT tablet Take 4 tablets by mouth daily.    . cyanocobalamin (,VITAMIN B-12,) 1000 MCG/ML injection Inject 1 mL (1,000 mcg total) into the muscle every 30 (thirty) days. 3 mL 3  . FLUoxetine HCl 60 MG TABS Take 60 mg by mouth daily. 90 tablet 1  . letrozole (FEMARA) 2.5 MG tablet Take 1 tablet (2.5 mg total) by mouth daily. 90 tablet 3  . lisinopril-hydrochlorothiazide (ZESTORETIC) 20-12.5 MG tablet Take 1 tablet by mouth daily. 90 tablet 3  . polyethylene glycol (MIRALAX / GLYCOLAX) 17 g packet Take 17 g by mouth daily.    . risperiDONE (RISPERDAL) 3 MG tablet Take 0.5 tablets (1.5 mg total) by mouth 2 (two) times daily. 90 tablet 1  . traZODone (DESYREL) 50 MG tablet Take 0.5-1 tablets (25-50 mg total) by mouth at bedtime as needed for sleep. 90 tablet 1   Current Facility-Administered Medications  Medication Dose Route Frequency Provider Last Rate Last Admin  . lidocaine (PF) (XYLOCAINE) 1 % injection 4 mL  4 mL Intradermal Once Virginia Crews, MD      . lidocaine (PF) (XYLOCAINE) 1 % injection 4 mL  4 mL Intradermal Once Virginia Crews, MD      . methylPREDNISolone acetate (DEPO-MEDROL) injection 40 mg  40 mg Intramuscular Once Virginia Crews, MD      . methylPREDNISolone acetate (DEPO-MEDROL) injection 40 mg  40 mg Intramuscular Once Brita Romp Dionne Bucy, MD         Musculoskeletal: Strength & Muscle Tone: [place]: Reports as WNL Patient leans: N/A  Psychiatric Specialty Exam: Review of Systems  Musculoskeletal:       Hip pain  Psychiatric/Behavioral: Negative for  agitation, confusion, decreased concentration, dysphoric mood, hallucinations, self-injury, sleep disturbance and suicidal ideas. The patient is not nervous/anxious and is not hyperactive.   All other systems reviewed and are negative.   There were no vitals taken for this visit.There is no height or weight on file to  calculate BMI.  General Appearance: UTA  Eye Contact:  UTA  Speech:  Clear and Coherent  Volume:  Normal  Mood:  Euthymic  Affect:  UTA  Thought Process:  Goal Directed and Descriptions of Associations: Intact  Orientation:  Full (Time, Place, and Person)  Thought Content: Logical   Suicidal Thoughts:  No  Homicidal Thoughts:  No  Memory:  Immediate;   Fair Recent;   Fair Remote;   Fair  Judgement:  Fair  Insight:  Fair  Psychomotor Activity:  UTA  Concentration:  Concentration: Fair and Attention Span: Fair  Recall:  [employer] of Knowledge: Fair  Language: Fair  Akathisia:  No  Handed:  Right  AIMS (if indicated): UTA  Assets:  Communication Skills Desire for Improvement Housing Social Support  ADL's:  Intact  Cognition: WNL  Sleep:  Fair   Screenings: PHQ2-9     Office Visit from 11/24/2018 in [facility]  PHQ-2 Total Score  0  PHQ-9 Total Score  3       Assessment and Plan: Purnell is a 74 year old Caucasian female, married, lives in [place], has a history of bipolar disorder, GAD, insomnia, cognitive disorder, multifocal stage I right breast cancer, status post lumpectomy, ovarian cyst removal was evaluated by phone today.  Patient preferred to do a phone call.  She is biologically predisposed given her multiple health problems.  She also has psychosocial stressors of recent fall, fracture and the pandemic.  Patient however is currently stable on medication regimen and will benefit from continued psychotherapy sessions.  Plan Bipolar disorder in remission Risperidone 3 mg p.o. daily divided dosage. Prozac 60 mg p.o.  daily  GAD-stable Continue psychotherapy sessions with Ms. Cecilie Lowers Continue Prozac  Insomnia-stable Trazodone 25 to 50 mg p.o. nightly as needed Patient is currently in physical therapy sessions for her pain Patient also continues to take melatonin p.o. nightly.  For cognitive changes-she will continue to follow-up with neurology.  I have reviewed medical records in [facility] dated 11/30/2019-Dr. Peggye Ley, 12/04/2019-DrHarlow Mares  Follow-up in clinic in 2 months or sooner if needed.  I have spent atleast 20 minutes non- face to face with patient today. More than 50 % of the time was spent for preparing to see the patient ( e.g., review of test, records ), obtaining and to review and separately obtained history , ordering medications and test ,psychoeducation and supportive psychotherapy and care coordination,as well as documenting clinical information in electronic health record. This note was generated in part or whole with voice recognition software. Voice recognition is usually quite accurate but there are transcription errors that can and very often do occur. I apologize for any typographical errors that were not detected and corrected.         Ursula Alert, MD 01/04/2020, 4:34 PM

## 2020-01-09 ENCOUNTER — Other Ambulatory Visit: Payer: Self-pay

## 2020-01-09 ENCOUNTER — Ambulatory Visit: Payer: Medicare PPO | Attending: Orthopaedic Surgery | Admitting: Occupational Therapy

## 2020-01-09 DIAGNOSIS — M25622 Stiffness of left elbow, not elsewhere classified: Secondary | ICD-10-CM | POA: Insufficient documentation

## 2020-01-09 DIAGNOSIS — M25551 Pain in right hip: Secondary | ICD-10-CM | POA: Insufficient documentation

## 2020-01-09 DIAGNOSIS — M25522 Pain in left elbow: Secondary | ICD-10-CM | POA: Insufficient documentation

## 2020-01-09 DIAGNOSIS — M6281 Muscle weakness (generalized): Secondary | ICD-10-CM | POA: Insufficient documentation

## 2020-01-09 DIAGNOSIS — M25552 Pain in left hip: Secondary | ICD-10-CM | POA: Diagnosis not present

## 2020-01-09 DIAGNOSIS — R262 Difficulty in walking, not elsewhere classified: Secondary | ICD-10-CM | POA: Diagnosis not present

## 2020-01-09 NOTE — Patient Instructions (Signed)
Cont Heat and PROM for elbow extention 3 positions  2 lbs weight standing against wall 30 reps- 3 positions  Sup/pro, wrist ext, flexion 2 lbs 10 reps 2 sets  Increase to 3 sets in 3 days  Pain free

## 2020-01-09 NOTE — Therapy (Signed)
[facility] [address], [place], [address] Phone: [phone]   Fax:  [phone]  Occupational Therapy Treatment  Patient Details  Name: Kendra [name] MRN: [id number] Date of Birth: 1946-04-24 Referring Provider (OT): Vance Peper Date: 01/09/2020  OT End of Session - 01/09/20 1210    Visit Number  2    Number of Visits  8    Date for OT Re-Evaluation  02/14/20    OT Start Time  [id number]    OT Stop Time  1230    OT Time Calculation (min)  42 min    Activity Tolerance  Patient tolerated treatment well    Behavior During Therapy  Flat affect       Past Medical History:  Diagnosis Date  . Anxiety   . Breast cancer ([place]) 03/2018   right breast cancer at 11:00 and 1:00  . Bursitis    bilateral hips and knees  . Complication of anesthesia   . Depression   . Family history of breast cancer 03/24/2018  . Family history of lung cancer 03/24/2018  . History of alcohol dependence ([place]) 2007   no ETOH; resolved since 2007  . History of hiatal hernia   . History of psychosis 2014   due to sleep disturbance  . Hypertension   . Personal history of radiation therapy 06/2018-07/2018   right breast ca  . PONV (postoperative nausea and vomiting)     Past Surgical History:  Procedure Laterality Date  . BREAST BIOPSY Right 03/14/2018   11:00 DCIS and invasive ductal carcinoma  . BREAST BIOPSY Right 03/14/2018   1:00 Invasive ductal carcinoma  . BREAST LUMPECTOMY Right 04/27/2018   lumpectomy of 11 and 1:00 cancers, clear margins, negative LN  . BREAST LUMPECTOMY WITH RADIOACTIVE SEED AND SENTINEL LYMPH NODE BIOPSY Right 04/27/2018   Procedure: RIGHT BREAST LUMPECTOMY WITH RADIOACTIVE SEED X 2 AND RIGHT SENTINEL LYMPH NODE BIOPSY;  Surgeon: [name], MD;  Location: [facility];  Service: General;  Laterality: Right;  . CATARACT EXTRACTION W/PHACO Left 08/30/2019   Procedure:  CATARACT EXTRACTION PHACO AND INTRAOCULAR LENS PLACEMENT ([place]) LEFT panoptix toric  01:20.5  12.7%  10.26;  Surgeon: Leandrew Koyanagi, MD;  Location: [facility];  Service: Ophthalmology;  Laterality: Left;  . CATARACT EXTRACTION W/PHACO Right 09/20/2019   Procedure: CATARACT EXTRACTION PHACO AND INTRAOCULAR LENS PLACEMENT ([facility]) RIGHT 5.71  00:36.4  15.7%;  Surgeon: Leandrew Koyanagi, MD;  Location: [facility];  Service: Ophthalmology;  Laterality: Right;  . LAPAROTOMY N/A 02/10/2018   Procedure: EXPLORATORY LAPAROTOMY;  Surgeon: Isabel Caprice, MD;  Location[facility];  Service: Gynecology;  Laterality: N/A;  . MASS EXCISION  01/2018   abdominal  . OVARIAN CYST REMOVAL    . SALPINGOOPHORECTOMY Bilateral 02/10/2018   Procedure: BILATERAL SALPINGO OOPHORECTOMY; PERITONEAL WASHINGS;  Surgeon: Isabel Caprice, MD;  Location: [facility];  Service: Gynecology;  Laterality: Bilateral;  . TONSILLECTOMY    . TONSILLECTOMY AND ADENOIDECTOMY  1953    There were no vitals filed for this visit.  Subjective Assessment - 01/09/20 1207    Subjective   I have seen the DR - xray showed good healing and can start strengthening - did not had weight - but pain only when stretch elbow    Pertinent History  Pt fell 11/23/2019 and fracture her R elbow - minimal displaced radius head fx - pt in sling if she is out  and about - but refer to OT for ROM ,PROM and weight limited 1-2 lbs -    Patient Stated Goals  I would like the ROM , strength better so I can push up from chair and have no pain    Currently in Pain?  Yes    Pain Score  3     Pain Location  Elbow    Pain Orientation  Left    Pain Descriptors / Indicators  Aching;Tightness    Pain Type  Acute pain    Pain Onset  More than a month ago    Pain Frequency  Intermittent         [facility] OT Assessment - 01/09/20 0001      AROM   Left Elbow Extension  -10   -5      Stiffness in elbow extention end range- pain 2-3/10 over distal  bicep         OT Treatments/Exercises (OP) - 01/09/20 0001      Moist Heat Therapy   Number Minutes Moist Heat  8 Minutes    Moist Heat Location  Elbow   elbow extention stretch        Heat prior to review of HEP to L elbow: review stretching at home- husband to assist and focus on supination position with elbow extended   Massage over volar distal upper arm -tender - prior to ROM  PROM for elbow extention end range - 3 position   2 lbs 20-30  reps elbow 3 positions against wall    2 lbs wrist flexion/ext over armrest  and sup/pro unsupported  2 x 12 reps pain free  2 x day all  And can increase to 3 rd set        OT Education - 01/09/20 1210    Education Details  HEP changes    Person(s) Educated  Patient    Methods  Explanation;Demonstration;Tactile cues;Verbal cues;Handout    Comprehension  Verbal cues required;Returned demonstration;Verbalized understanding       OT Short Term Goals - 12/20/19 2028      OT SHORT TERM GOAL #1   Title  Pt to be independent in HEP to increase L elbow extention to -5 and increase strenght to carry more than 5 lbs    Baseline  no knowledge of HEP    Time  4    Period  Weeks    Status  New    Target Date  01/17/20        OT Long Term Goals - 12/20/19 2029      OT LONG TERM GOAL #1   Title  L Elbow extention improve for pt to pull shirt over head, push up and carry more than 8 lbs without increase symptoms    Baseline  -22 extention of L elbow and weight limit 2 lbs - pushing up by accident pain 5/10    Time  8    Period  Weeks    Status  New    Target Date  02/14/20      OT LONG TERM GOAL #2   Title  Pt AROM and strength in L elbow improve to prior level for function    Baseline  -22 extention , weight 2 lbs limited - cannot do makidng bed, flowers, dry back , carry more than 2 lbs , no pushing up    Time  8    Period  Weeks    Status  New  Target Date  02/14/20            Plan - 01/09/20 1234     Clinical Impression Statement  Pt is 6 1/2 wks out from L mimal fx of radius head fx - healing well per MD and can start strengthening - pt increase elbow extention this date compare to 2 wks ag o and initiate 2 lbs for wrist and elbow    OT Occupational Profile and History  Problem Focused Assessment - Including review of records relating to presenting problem    Occupational performance deficits (Please refer to evaluation for details):  ADL's;IADL's;Play;Leisure;Social Participation    Body Structure / Function / Physical Skills  ADL;Flexibility;ROM;UE functional use;Pain;Strength;IADL    Rehab Potential  Good    Clinical Decision Making  Limited treatment options, no task modification necessary    Comorbidities Affecting Occupational Performance:  May have comorbidities impacting occupational performance    Modification or Assistance to Complete Evaluation   No modification of tasks or assist necessary to complete eval    OT Frequency  1x / week    OT Duration  6 weeks    OT Treatment/Interventions  Self-care/ADL training;Therapeutic exercise;Patient/family education;Cryotherapy;Paraffin;Moist Heat;Manual Therapy;Passive range of motion;Therapeutic activities    Plan  assess progress with HEP    OT Home Exercise Plan  see pt instruction    Consulted and Agree with Plan of Care  Patient       Patient will benefit from skilled therapeutic intervention in order to improve the following deficits and impairments:   Body Structure / Function / Physical Skills: ADL, Flexibility, ROM, UE functional use, Pain, Strength, IADL       Visit Diagnosis: Muscle weakness (generalized)  Stiffness of left elbow, not elsewhere classified  Pain in left elbow    Problem List Patient Active Problem List   Diagnosis Date Noted  . 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 Bipolar disorder, in full remission, most recent episode depressed ([place]) 11/08/2019  . Avitaminosis D 08/24/2019  . Insomnia due to  mental condition 05/31/2019  . Bipolar I disorder, most recent episode depressed ([place]) 05/31/2019  . Mild cognitive disorder 05/31/2019  . Alcohol use disorder, moderate, in sustained remission ([place]) 05/31/2019  . Elevated tumor markers 05/27/2019  . GAD (generalized anxiety disorder) 04/10/2019  . Altered mental status 04/10/2019  . MDD (major depressive disorder), severe ([place]) 01/31/2019  . High serum carbohydrate antigen 19-9 (CA19-9) 12/01/2018  . Adjustment disorder with anxious mood 11/24/2018  . Essential hypertension 11/24/2018  . Screening for lipid disorders 11/24/2018  . B12 deficiency 10/17/2018  . Incisional hernia, without obstruction or gangrene 09/12/2018  . Genetic testing 04/07/2018  . Family history of breast cancer 03/24/2018  . Family history of lung cancer 03/24/2018  . Malignant neoplasm of upper-outer quadrant of right breast in female, estrogen receptor positive ([place]) 03/17/2018  . Malignant neoplasm of upper-inner quadrant of right breast in female, estrogen receptor positive ([place]) 03/17/2018  . History of alcohol dependence ([place]) 02/07/2018  . History of psychosis 02/07/2018  . History of insomnia 02/07/2018    Rosalyn Gess OTR/L,CLT 01/09/2020, 12:36 PM  [facility] [address], [place], [address] Phone: [phone]   Fax:  [phone]  Name: Kendra [name] MRN: [id number] Date of Birth: May 27, 1946

## 2020-01-11 ENCOUNTER — Encounter: Payer: Self-pay | Admitting: Licensed Clinical Social Worker

## 2020-01-11 ENCOUNTER — Ambulatory Visit (INDEPENDENT_AMBULATORY_CARE_PROVIDER_SITE_OTHER): Payer: Medicare PPO | Admitting: Licensed Clinical Social Worker

## 2020-01-11 ENCOUNTER — Other Ambulatory Visit: Payer: Self-pay

## 2020-01-11 DIAGNOSIS — F411 Generalized anxiety disorder: Secondary | ICD-10-CM | POA: Diagnosis not present

## 2020-01-11 DIAGNOSIS — F3176 Bipolar disorder, in full remission, most recent episode depressed: Secondary | ICD-10-CM | POA: Diagnosis not present

## 2020-01-11 NOTE — Progress Notes (Signed)
Virtual Visit via Video Note  I connected with Kendra [name] on 01/11/20 at  9:00 AM EST by a video enabled telemedicine application and verified that I am speaking with the correct person using two identifiers.   I discussed the limitations of evaluation and management by telemedicine and the availability of in person appointments. The patient expressed understanding and agreed to proceed.  I discussed the assessment and treatment plan with the patient. The patient was provided an opportunity to ask questions and all were answered. The patient agreed with the plan and demonstrated an understanding of the instructions.   The patient was advised to call back or seek an in-person evaluation if the symptoms worsen or if the condition fails to improve as anticipated.  I provided 45 minutes of non-face-to-face time during this encounter.   Alden Hipp, LCSW    THERAPIST PROGRESS NOTE  Session Time: 0900  Participation Level: Active  Behavioral Response: CasualAlertAnxious  Type of Therapy: Individual Therapy  Treatment Goals addressed: Coping  Interventions: CBT  Summary: Kendra [name] is a 74 y.o. female who presents with continued symptoms related to her diagnosis. Kendra [name] reports doing well since our last session. She reports her anxiety has been manageable and has not noted any anxiety other than having to do with physical therapy. Kendra [name] reports physical therapy is difficult overall, but she is especially having a difficult time completing her exercises at home. Kendra [name] reports she has attempted to talk to her physical therapist about this information, but was told she should be meeting certain benchmarks in order to be "where they expect her to be." LCSW validated Kendra [name]'s frustration, and encouraged her to continue advocating for herself by communicating with her PT that she is uncomfortable or is in too much pain to do what's being expected, and perhaps they could modify the exercises.  Kendra [name] expressed understanding and agreement with this plan. Kendra [name] went on to discuss continuing to paint and engage in that skill which brings her a lot of calm and joy. LCSW validated use of this coping strategy and held space for Kendra [name] to discuss other things going on in her life that are making her feel happy.  Suicidal/Homicidal: No  Therapist Response: Kendra [name] continues to work towards her tx goals but has not yet reached them. We will continue to work on improving CBT skills moving forward to decrease anxiety symptoms.   Plan: Return again in 2 weeks.  Diagnosis: Axis I: Bipolar Disorder    Axis II: No diagnosis    Alden Hipp, LCSW 01/11/2020

## 2020-01-15 DIAGNOSIS — M545 Low back pain: Secondary | ICD-10-CM | POA: Diagnosis not present

## 2020-01-16 ENCOUNTER — Ambulatory Visit: Payer: Medicare PPO

## 2020-01-16 ENCOUNTER — Other Ambulatory Visit: Payer: Self-pay

## 2020-01-16 ENCOUNTER — Ambulatory Visit: Payer: Medicare PPO | Admitting: Licensed Clinical Social Worker

## 2020-01-16 DIAGNOSIS — M25552 Pain in left hip: Secondary | ICD-10-CM | POA: Diagnosis not present

## 2020-01-16 DIAGNOSIS — M25622 Stiffness of left elbow, not elsewhere classified: Secondary | ICD-10-CM | POA: Diagnosis not present

## 2020-01-16 DIAGNOSIS — M25551 Pain in right hip: Secondary | ICD-10-CM

## 2020-01-16 DIAGNOSIS — M6281 Muscle weakness (generalized): Secondary | ICD-10-CM

## 2020-01-16 DIAGNOSIS — M25522 Pain in left elbow: Secondary | ICD-10-CM | POA: Diagnosis not present

## 2020-01-16 DIAGNOSIS — R262 Difficulty in walking, not elsewhere classified: Secondary | ICD-10-CM | POA: Diagnosis not present

## 2020-01-16 NOTE — Therapy (Signed)
[facility] [address] [place], [address] Phone: [phone]   Fax:  [phone]  Physical Therapy Treatment  Patient Details  Name: Kendra [name] MRN: [id number] Date of Birth: Jun 27, 1946 No data recorded  Encounter Date: 01/16/2020  PT End of Session - 01/16/20 1114    Visit Number  25    Number of Visits  30    Date for PT Re-Evaluation  01/18/20    Authorization Type  6/10    PT Start Time  1050    PT Stop Time  1132    PT Time Calculation (min)  42 min    Equipment Utilized During Treatment  Gait belt    Activity Tolerance  Patient tolerated treatment well;No increased pain    Behavior During Therapy  Flat affect       Past Medical History:  Diagnosis Date  . Anxiety   . Breast cancer ([place]) 03/2018   right breast cancer at 11:00 and 1:00  . Bursitis    bilateral hips and knees  . Complication of anesthesia   . Depression   . Family history of breast cancer 03/24/2018  . Family history of lung cancer 03/24/2018  . History of alcohol dependence ([place]) 2007   no ETOH; resolved since 2007  . History of hiatal hernia   . History of psychosis 2014   due to sleep disturbance  . Hypertension   . Personal history of radiation therapy 06/2018-07/2018   right breast ca  . PONV (postoperative nausea and vomiting)     Past Surgical History:  Procedure Laterality Date  . BREAST BIOPSY Right 03/14/2018   11:00 DCIS and invasive ductal carcinoma  . BREAST BIOPSY Right 03/14/2018   1:00 Invasive ductal carcinoma  . BREAST LUMPECTOMY Right 04/27/2018   lumpectomy of 11 and 1:00 cancers, clear margins, negative LN  . BREAST LUMPECTOMY WITH RADIOACTIVE SEED AND SENTINEL LYMPH NODE BIOPSY Right 04/27/2018   Procedure: RIGHT BREAST LUMPECTOMY WITH RADIOACTIVE SEED X 2 AND RIGHT SENTINEL LYMPH NODE BIOPSY;  Surgeon: [name], MD;  Location: [facility];  Service: General;   Laterality: Right;  . CATARACT EXTRACTION W/PHACO Left 08/30/2019   Procedure: CATARACT EXTRACTION PHACO AND INTRAOCULAR LENS PLACEMENT ([place]) LEFT panoptix toric  01:20.5  12.7%  10.26;  Surgeon: Leandrew Koyanagi, MD;  Location: [facility];  Service: Ophthalmology;  Laterality: Left;  . CATARACT EXTRACTION W/PHACO Right 09/20/2019   Procedure: CATARACT EXTRACTION PHACO AND INTRAOCULAR LENS PLACEMENT ([facility]) RIGHT 5.71  00:36.4  15.7%;  Surgeon: Leandrew Koyanagi, MD;  Location: [facility];  Service: Ophthalmology;  Laterality: Right;  . LAPAROTOMY N/A 02/10/2018   Procedure: EXPLORATORY LAPAROTOMY;  Surgeon: Isabel Caprice, MD;  [facility];  Service: Gynecology;  Laterality: N/A;  . MASS EXCISION  01/2018   abdominal  . OVARIAN CYST REMOVAL    . SALPINGOOPHORECTOMY Bilateral 02/10/2018   Procedure: BILATERAL SALPINGO OOPHORECTOMY; PERITONEAL WASHINGS;  Surgeon: Isabel Caprice, MD;  [facility];  Service: Gynecology;  Laterality: Bilateral;  . TONSILLECTOMY    . TONSILLECTOMY AND ADENOIDECTOMY  1953    There were no vitals filed for this visit.  Subjective Assessment - 01/16/20 1055    Subjective  Paient reports no significant change in symptoms since the previous session. Were unable to be seen secondary to scheduling and insurance limitations.    Pertinent History  Patient report unclear regarding onset. Visited MD on Sept 16 and  received injection with relief for approximately 1 month. Reports she cannot receive an additional injection pending eye surgery. Received dx from MD of hip bursitis in B hips. Reports pain with walking, stanindg, sitting 5/10 current 7/10 worst 3/10 best, with temporary relief when she is sitting "just right" and with naproxen. Agg: activity. Cormorbidities: hx of breast cancer, HTN, MCI, insomnia, cataracts    Limitations  Sitting;Standing;Walking;House hold activities    How long can you sit comfortably?  Pain constant, reduced  with neutral pelvic tilt    How long can you stand comfortably?  Pain constant, increases with time    How long can you walk comfortably?  Pain constant, increases with time    Patient Stated Goals  Be able to move without hurting, walk 1 hour    Currently in Pain?  No/denies    Pain Score  5     Pain Location  Hip    Pain Orientation  Left    Pain Type  Acute pain    Pain Onset  More than a month ago    Pain Frequency  Intermittent       TREATMENT Therapeutic Exercise  Sit to stands with YTB around knees for hip abduction -- x 10  Side stepping with UE support for hip musculature activation --5 x 5 B  Hip abduction in standing with UE support -- x 10 requires frequent cueing  Hip extension in standing with UE support -- x 10  Hip extension in sitting against manual support -- x10 with 3 sec holds  Backwards amb with B UE support with UE support -- .3 mph 2 min  Walking with focus on improving hip extension/lumbar extension and heel strike -- x 20     PT Education - 01/16/20 1113    Education provided  Yes    Education Details  form/technique with exercise    Person(s) Educated  Patient    Methods  Explanation;Demonstration    Comprehension  Verbalized understanding;Returned demonstration       PT Short Term Goals - 09/12/19 1419      PT SHORT TERM GOAL #1   Title  Patient will demonstrate complaince with HEP to speed recovery and reduce total number of visits.    Baseline  HEP given    Time  2    Period  Weeks    Status  New    Target Date  09/27/19        PT Long Term Goals - 12/07/19 [id number]      PT LONG TERM GOAL #1   Title  Patient will improve hip strength to 4/5 grossly to facilitate improved mobility with ADLs.    Baseline  3, 4-; 12/22 deferred to NV; 11/09/2019: 4-/5; 12/07/2019 L hip flexor 4, abd and ext 3+    Time  8    Period  Weeks    Status  On-going    Target Date  01/18/20      PT LONG TERM GOAL #2   Title  Patient will demonstrate walking  tolerance increased to 30 minutes without increase in symptoms to demonstrate improved ability to perform walking program for cardiovascular health.    Baseline  10 minute tolerance (patient report); 12/22 10 minutes (per pt report); 11/09/2019: 7 min; 12/07/2019 unable to assess due to icy streets    Time  8    Period  Weeks    Status  Unable to assess    Target Date  01/18/20  PT LONG TERM GOAL #3   Title  Patient will report worst pain of 3/10 during therapy to demonstrate reduced disability    Baseline  Worst 7/10; 12/22 worst 9/10; 11/09/2019: 9/10; 12/07/2019 7/10    Time  8    Period  Weeks    Status  On-going    Target Date  01/18/20      PT LONG TERM GOAL #4   Title  Patient will demonstrate ability to perform all bed mobility maneuvers without increase in pain for reduced disability with mobility and transfers with ADLs.    Baseline  Reports pain with bed mobility supine <> sidelying L/R, sidelying to sitting EOB; 12/22 pt able to perform I without increased pain    Time  8    Period  Weeks    Status  Achieved    Target Date  01/18/20      PT LONG TERM GOAL #5   Title  Patient will improve 10MW speed to 1.0 m/sec to meet norms for minimal fall risk and full community ambulation.    Baseline  0.88 m/sec; 12/22 0.96 m/sec; 11/09/2019: deferred; 12/07/2019 0.90 m/sec    Time  8    Period  Weeks    Status  On-going    Target Date  01/18/20      PT LONG TERM GOAL #6   Title  Patient will increase 6MWT by 200 ft to demonstrate improvement in functional capacity for transition to cardiac walking program    Baseline  6MWT = 1195; 1/7/20201: deferred; 12/07/2019 1095    Time  8    Period  Weeks    Status  On-going    Target Date  01/18/20            Plan - 01/16/20 1854    Clinical Impression Statement  Patient continues to demonstrate decreased hip extension in standing with walking, this is improved after focusing on performing verbal and tactile cueing to improve glute  activation in standing. Patient demonstrates decreased pain and spasms with no increase in pain with transfers after cueing to activate glute musculature. Patient will benefit from furhter skilled therapy focused on improving glute stabilization to return to prior level of function.    Personal Factors and Comorbidities  Age;Comorbidity 3+;Time since onset of injury/illness/exacerbation;Comorbidity 2    Comorbidities  MCI, HTN, CA, cataracts    Examination-Activity Limitations  Bend;Sit;Squat;Stairs;Stand;Sleep    Examination-Participation Restrictions  Cleaning;Community Activity    Stability/Clinical Decision Making  Evolving/Moderate complexity    Rehab Potential  Fair    Clinical Impairments Affecting Rehab Potential  MCI    PT Frequency  2x / week   Eval and 1 f/u visit   PT Duration  6 weeks    PT Treatment/Interventions  ADLs/Self Care Home Management;Therapeutic exercise;Patient/family education;Neuromuscular re-education;Therapeutic activities;Functional mobility training;Balance training;Manual techniques;Gait training;Stair training;Moist Heat;Electrical Stimulation;Cryotherapy;Taping;Dry needling;Energy conservation;Passive range of motion;Joint Manipulations    PT Next Visit Plan  Progress glute therex, pelvic-hip dissociation; check and progress HEP with stretches    PT Home Exercise Plan  lateral step ups, step ups, hip kickers ext/abd red theraband    Consulted and Agree with Plan of Care  Patient       Patient will benefit from skilled therapeutic intervention in order to improve the following deficits and impairments:  Decreased range of motion, Postural dysfunction, Pain, Abnormal gait, Decreased activity tolerance, Decreased coordination, Decreased endurance, Decreased balance, Decreased mobility, Difficulty walking, Hypomobility, Increased muscle spasms, Impaired perceived functional ability,  Decreased strength, Impaired flexibility  Visit Diagnosis: Muscle weakness  (generalized)  Pain in left hip  Pain in right hip  Difficulty in walking, not elsewhere classified     Problem List Patient Active Problem List   Diagnosis Date Noted  . 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 Bipolar disorder, in full remission, most recent episode depressed ([place]) 11/08/2019  . Avitaminosis D 08/24/2019  . Insomnia due to mental condition 05/31/2019  . Bipolar I disorder, most recent episode depressed ([place]) 05/31/2019  . Mild cognitive disorder 05/31/2019  . Alcohol use disorder, moderate, in sustained remission ([place]) 05/31/2019  . Elevated tumor markers 05/27/2019  . GAD (generalized anxiety disorder) 04/10/2019  . Altered mental status 04/10/2019  . MDD (major depressive disorder), severe ([place]) 01/31/2019  . High serum carbohydrate antigen 19-9 (CA19-9) 12/01/2018  . Adjustment disorder with anxious mood 11/24/2018  . Essential hypertension 11/24/2018  . Screening for lipid disorders 11/24/2018  . B12 deficiency 10/17/2018  . Incisional hernia, without obstruction or gangrene 09/12/2018  . Genetic testing 04/07/2018  . Family history of breast cancer 03/24/2018  . Family history of lung cancer 03/24/2018  . Malignant neoplasm of upper-outer quadrant of right breast in female, estrogen receptor positive ([place]) 03/17/2018  . Malignant neoplasm of upper-inner quadrant of right breast in female, estrogen receptor positive ([place]) 03/17/2018  . History of alcohol dependence ([place]) 02/07/2018  . History of psychosis 02/07/2018  . History of insomnia 02/07/2018    Blythe Stanford, PT DPT 01/16/2020, 6:59 PM  [facility] [address], [place], [address] Phone: [phone]   Fax:  [phone]  Name: Kendra [name] MRN: [id number] Date of Birth: [birth year]

## 2020-01-18 ENCOUNTER — Ambulatory Visit: Payer: Medicare PPO | Admitting: Occupational Therapy

## 2020-01-18 ENCOUNTER — Ambulatory Visit: Payer: Medicare PPO

## 2020-01-18 ENCOUNTER — Other Ambulatory Visit: Payer: Self-pay

## 2020-01-18 DIAGNOSIS — M6281 Muscle weakness (generalized): Secondary | ICD-10-CM

## 2020-01-18 DIAGNOSIS — M25552 Pain in left hip: Secondary | ICD-10-CM | POA: Diagnosis not present

## 2020-01-18 DIAGNOSIS — M25551 Pain in right hip: Secondary | ICD-10-CM | POA: Diagnosis not present

## 2020-01-18 DIAGNOSIS — R262 Difficulty in walking, not elsewhere classified: Secondary | ICD-10-CM | POA: Diagnosis not present

## 2020-01-18 DIAGNOSIS — M25622 Stiffness of left elbow, not elsewhere classified: Secondary | ICD-10-CM

## 2020-01-18 DIAGNOSIS — M25522 Pain in left elbow: Secondary | ICD-10-CM

## 2020-01-18 NOTE — Therapy (Signed)
[facility] [address], [place], [address] Phone: [phone]   Fax:  [phone]  Occupational Therapy Treatment  Patient Details  Name: Kendra [name] MRN: [id number] Date of Birth: July 06, 1946 Referring Provider (OT): Vance Peper Date: 01/18/2020  OT End of Session - 01/18/20 0954    Visit Number  3    Number of Visits  8    Date for OT Re-Evaluation  02/14/20    OT Start Time  0903    OT Stop Time  0945    OT Time Calculation (min)  42 min    Activity Tolerance  Patient tolerated treatment well    Behavior During Therapy  Flat affect       Past Medical History:  Diagnosis Date  . Anxiety   . Breast cancer ([place]) 03/2018   right breast cancer at 11:00 and 1:00  . Bursitis    bilateral hips and knees  . Complication of anesthesia   . Depression   . Family history of breast cancer 03/24/2018  . Family history of lung cancer 03/24/2018  . History of alcohol dependence ([place]) 2007   no ETOH; resolved since 2007  . History of hiatal hernia   . History of psychosis 2014   due to sleep disturbance  . Hypertension   . Personal history of radiation therapy 06/2018-07/2018   right breast ca  . PONV (postoperative nausea and vomiting)     Past Surgical History:  Procedure Laterality Date  . BREAST BIOPSY Right 03/14/2018   11:00 DCIS and invasive ductal carcinoma  . BREAST BIOPSY Right 03/14/2018   1:00 Invasive ductal carcinoma  . BREAST LUMPECTOMY Right 04/27/2018   lumpectomy of 11 and 1:00 cancers, clear margins, negative LN  . BREAST LUMPECTOMY WITH RADIOACTIVE SEED AND SENTINEL LYMPH NODE BIOPSY Right 04/27/2018   Procedure: RIGHT BREAST LUMPECTOMY WITH RADIOACTIVE SEED X 2 AND RIGHT SENTINEL LYMPH NODE BIOPSY;  Surgeon: [name], MD;  Location: [facility];  Service: General;  Laterality: Right;  . CATARACT EXTRACTION W/PHACO Left 08/30/2019   Procedure:  CATARACT EXTRACTION PHACO AND INTRAOCULAR LENS PLACEMENT ([place]) LEFT panoptix toric  01:20.5  12.7%  10.26;  Surgeon: Leandrew Koyanagi, MD;  Location: [facility];  Service: Ophthalmology;  Laterality: Left;  . CATARACT EXTRACTION W/PHACO Right 09/20/2019   Procedure: CATARACT EXTRACTION PHACO AND INTRAOCULAR LENS PLACEMENT ([facility]) RIGHT 5.71  00:36.4  15.7%;  Surgeon: Leandrew Koyanagi, MD;  Location: [facility];  Service: Ophthalmology;  Laterality: Right;  . LAPAROTOMY N/A 02/10/2018   Procedure: EXPLORATORY LAPAROTOMY;  Surgeon: Isabel Caprice, MD;  Location: [facility];  Service: Gynecology;  Laterality: N/A;  . MASS EXCISION  01/2018   abdominal  . OVARIAN CYST REMOVAL    . SALPINGOOPHORECTOMY Bilateral 02/10/2018   Procedure: BILATERAL SALPINGO OOPHORECTOMY; PERITONEAL WASHINGS;  Surgeon: Isabel Caprice, MD;  Location: [facility];  Service: Gynecology;  Laterality: Bilateral;  . TONSILLECTOMY    . TONSILLECTOMY AND ADENOIDECTOMY  1953    There were no vitals filed for this visit.  Subjective Assessment - 01/18/20 0950    Subjective   Doing okay -I forgot to bring the 2 lbs weight- I did okay - no pain but not using it yet with anything heavy    Pertinent History  Pt fell 11/23/2019 and fracture her R elbow - minimal displaced radius head fx - pt in sling if she is out and about -  but refer to OT for ROM ,PROM and weight limited 1-2 lbs -    Patient Stated Goals  I would like the ROM , strength better so I can push up from chair and have no pain    Currently in Pain?  No/denies         [facility] OT Assessment - 01/18/20 0001      AROM   Left Elbow Extension  -10   in clinic 0     Strength   Right Hand Grip (lbs)  30    Right Hand Lateral Pinch  11.5 lbs    Right Hand 3 Point Pinch  10 lbs    Left Hand Grip (lbs)  19    Left Hand Lateral Pinch  8 lbs    Left Hand 3 Point Pinch  9 lbs       assess AROM for flexion and extention of elbow - and strength  4+/5 in  elbow extention  And flexion WNL  Wrist AROM WNL         OT Treatments/Exercises (OP) - 01/18/20 0001      Moist Heat Therapy   Number Minutes Moist Heat  8 Minutes    Moist Heat Location  --   elbow extention stretch         ed pt on correct positioning of L upper arm during elbow extention stretch - to increase extention at home  Elevate upper arm - review stretching at home-focus on supination positioning with elbow extended 5 x 30 sec after heating pad stretch done    Massage over volar distal upper arm- prior to ROM  2 lbs 20-30  reps elbow flexion and extention same position - with focus on end range elbow extention - to do palm up and thumb up -  2x day   Add teal putty to HEP for grip , lat and 3 point grip 2x 12-15 reps  1 x day    Had no pain        OT Education - 01/18/20 0951    Education Details  HEP changes    Person(s) Educated  Patient    Methods  Explanation;Demonstration;Tactile cues;Verbal cues;Handout    Comprehension  Verbal cues required;Returned demonstration;Verbalized understanding       OT Short Term Goals - 12/20/19 2028      OT SHORT TERM GOAL #1   Title  Pt to be independent in HEP to increase L elbow extention to -5 and increase strenght to carry more than 5 lbs    Baseline  no knowledge of HEP    Time  4    Period  Weeks    Status  New    Target Date  01/17/20        OT Long Term Goals - 12/20/19 2029      OT LONG TERM GOAL #1   Title  L Elbow extention improve for pt to pull shirt over head, push up and carry more than 8 lbs without increase symptoms    Baseline  -22 extention of L elbow and weight limit 2 lbs - pushing up by accident pain 5/10    Time  8    Period  Weeks    Status  New    Target Date  02/14/20      OT LONG TERM GOAL #2   Title  Pt AROM and strength in L elbow improve to prior level for function    Baseline  -22 extention ,  weight 2 lbs limited - cannot do makidng bed, flowers, dry back , carry  more than 2 lbs , no pushing up    Time  8    Period  Weeks    Status  New    Target Date  02/14/20            Plan - 01/18/20 0955    Clinical Impression Statement  Pt is 7 1/2 wks out from minimal fx of radius head fx - pt show about same elbow extention this date coming in - change and review again her elbow extention stretch and use of 2 lbs weight - flexion WNL - but extention still -10 - grip and lat grip decrease - add putty to HEP    OT Occupational Profile and History  Problem Focused Assessment - Including review of records relating to presenting problem    Occupational performance deficits (Please refer to evaluation for details):  ADL's;IADL's;Play;Leisure;Social Participation    Body Structure / Function / Physical Skills  ADL;Flexibility;ROM;UE functional use;Pain;Strength;IADL    Rehab Potential  Good    Clinical Decision Making  Limited treatment options, no task modification necessary    Comorbidities Affecting Occupational Performance:  May have comorbidities impacting occupational performance    Modification or Assistance to Complete Evaluation   No modification of tasks or assist necessary to complete eval    OT Frequency  1x / week    OT Duration  4 weeks    OT Treatment/Interventions  Self-care/ADL training;Therapeutic exercise;Patient/family education;Cryotherapy;Paraffin;Moist Heat;Manual Therapy;Passive range of motion;Therapeutic activities    Plan  assess progress with HEP    OT Home Exercise Plan  see pt instruction    Consulted and Agree with Plan of Care  Patient       Patient will benefit from skilled therapeutic intervention in order to improve the following deficits and impairments:   Body Structure / Function / Physical Skills: ADL, Flexibility, ROM, UE functional use, Pain, Strength, IADL       Visit Diagnosis: Muscle weakness (generalized)  Stiffness of left elbow, not elsewhere classified  Pain in left elbow    Problem List Patient  Active Problem List   Diagnosis Date Noted  . 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 Bipolar disorder, in full remission, most recent episode depressed ([place]) 11/08/2019  . Avitaminosis D 08/24/2019  . Insomnia due to mental condition 05/31/2019  . Bipolar I disorder, most recent episode depressed ([place]) 05/31/2019  . Mild cognitive disorder 05/31/2019  . Alcohol use disorder, moderate, in sustained remission ([place]) 05/31/2019  . Elevated tumor markers 05/27/2019  . GAD (generalized anxiety disorder) 04/10/2019  . Altered mental status 04/10/2019  . MDD (major depressive disorder), severe ([place]) 01/31/2019  . High serum carbohydrate antigen 19-9 (CA19-9) 12/01/2018  . Adjustment disorder with anxious mood 11/24/2018  . Essential hypertension 11/24/2018  . Screening for lipid disorders 11/24/2018  . B12 deficiency 10/17/2018  . Incisional hernia, without obstruction or gangrene 09/12/2018  . Genetic testing 04/07/2018  . Family history of breast cancer 03/24/2018  . Family history of lung cancer 03/24/2018  . Malignant neoplasm of upper-outer quadrant of right breast in female, estrogen receptor positive ([place]) 03/17/2018  . Malignant neoplasm of upper-inner quadrant of right breast in female, estrogen receptor positive ([place]) 03/17/2018  . History of alcohol dependence ([place]) 02/07/2018  . History of psychosis 02/07/2018  . History of insomnia 02/07/2018    Rosalyn Gess OTR/L,CLT 01/18/2020, 9:58 AM  [facility] [address], [place], [address] Phone: [phone]   Fax:  [phone]  Name: Gaudalupe Coyle MRN: [id number] Date of Birth: [birth year]

## 2020-01-18 NOTE — Therapy (Signed)
[facility] [address], [place], [address] Phone: [phone]   Fax:  [phone]  Physical Therapy Treatment  Patient Details  Name: Kendra [name] MRN: [id number] Date of Birth: Dec 16, 1945 No data recorded  Encounter Date: 01/18/2020  PT End of Session - 01/18/20 0826    Visit Number  26    Number of Visits  30    Date for PT Re-Evaluation  01/18/20    Authorization Type  7/10    PT Start Time  0820    PT Stop Time  0900    PT Time Calculation (min)  40 min    Equipment Utilized During Treatment  Gait belt    Activity Tolerance  Patient tolerated treatment well;No increased pain    Behavior During Therapy  Flat affect       Past Medical History:  Diagnosis Date  . Anxiety   . Breast cancer ([place]) 03/2018   right breast cancer at 11:00 and 1:00  . Bursitis    bilateral hips and knees  . Complication of anesthesia   . Depression   . Family history of breast cancer 03/24/2018  . Family history of lung cancer 03/24/2018  . History of alcohol dependence ([place]) 2007   no ETOH; resolved since 2007  . History of hiatal hernia   . History of psychosis 2014   due to sleep disturbance  . Hypertension   . Personal history of radiation therapy 06/2018-07/2018   right breast ca  . PONV (postoperative nausea and vomiting)     Past Surgical History:  Procedure Laterality Date  . BREAST BIOPSY Right 03/14/2018   11:00 DCIS and invasive ductal carcinoma  . BREAST BIOPSY Right 03/14/2018   1:00 Invasive ductal carcinoma  . BREAST LUMPECTOMY Right 04/27/2018   lumpectomy of 11 and 1:00 cancers, clear margins, negative LN  . BREAST LUMPECTOMY WITH RADIOACTIVE SEED AND SENTINEL LYMPH NODE BIOPSY Right 04/27/2018   Procedure: RIGHT BREAST LUMPECTOMY WITH RADIOACTIVE SEED X 2 AND RIGHT SENTINEL LYMPH NODE BIOPSY;  Surgeon: [name], MD;  Location: [facility];  Service: General;   Laterality: Right;  . CATARACT EXTRACTION W/PHACO Left 08/30/2019   Procedure: CATARACT EXTRACTION PHACO AND INTRAOCULAR LENS PLACEMENT ([place]) LEFT panoptix toric  01:20.5  12.7%  10.26;  Surgeon: Leandrew Koyanagi, MD;  Location: [facility];  Service: Ophthalmology;  Laterality: Left;  . CATARACT EXTRACTION W/PHACO Right 09/20/2019   Procedure: CATARACT EXTRACTION PHACO AND INTRAOCULAR LENS PLACEMENT ([facility]) RIGHT 5.71  00:36.4  15.7%;  Surgeon: Leandrew Koyanagi, MD;  Location: [facility];  Service: Ophthalmology;  Laterality: Right;  . LAPAROTOMY N/A 02/10/2018   Procedure: EXPLORATORY LAPAROTOMY;  Surgeon: Isabel Caprice, MD;  [facility];  Service: Gynecology;  Laterality: N/A;  . MASS EXCISION  01/2018   abdominal  . OVARIAN CYST REMOVAL    . SALPINGOOPHORECTOMY Bilateral 02/10/2018   Procedure: BILATERAL SALPINGO OOPHORECTOMY; PERITONEAL WASHINGS;  Surgeon: Isabel Caprice, MD;  [facility];  Service: Gynecology;  Laterality: Bilateral;  . TONSILLECTOMY    . TONSILLECTOMY AND ADENOIDECTOMY  1953    There were no vitals filed for this visit.  Subjective Assessment - 01/18/20 [id number]    Subjective  Patient reports pain has been feeling about the same since the previous session. Patient state she was performing    Pertinent History  Patient report unclear regarding onset. Visited MD on Sept 16 and received injection with relief  for approximately 1 month. Reports she cannot receive an additional injection pending eye surgery. Received dx from MD of hip bursitis in B hips. Reports pain with walking, stanindg, sitting 5/10 current 7/10 worst 3/10 best, with temporary relief when she is sitting "just right" and with naproxen. Agg: activity. Cormorbidities: hx of breast cancer, HTN, MCI, insomnia, cataracts    Limitations  Sitting;Standing;Walking;House hold activities    How long can you sit comfortably?  Pain constant, reduced with neutral pelvic tilt    How long  can you stand comfortably?  Pain constant, increases with time    How long can you walk comfortably?  Pain constant, increases with time    Patient Stated Goals  Be able to move without hurting, walk 1 hour    Pain Onset  More than a month ago         TREATMENT Therapeutic Exercise   Sit to stands with YTB around knees for hip abduction -- x 10 Side stepping with UE support for hip musculature activation -2 x 10 B Seated hip IR without resistance - x 20   Seated Hip ER with legs extended and YTB - x 20  Seated hip abduction with GTB - x 20 Knee flexion resisted with GTB - x 15 B  Walking with focus on improving hip extension/lumbar extension and heel strike -- x 318ft   Performed exercises to improve hip strengthening     PT Short Term Goals - 09/12/19 1419      PT SHORT TERM GOAL #1   Title  Patient will demonstrate complaince with HEP to speed recovery and reduce total number of visits.    Baseline  HEP given    Time  2    Period  Weeks    Status  New    Target Date  09/27/19        PT Long Term Goals - 12/07/19 [id number]      PT LONG TERM GOAL #1   Title  Patient will improve hip strength to 4/5 grossly to facilitate improved mobility with ADLs.    Baseline  3, 4-; 12/22 deferred to NV; 11/09/2019: 4-/5; 12/07/2019 L hip flexor 4, abd and ext 3+    Time  8    Period  Weeks    Status  On-going    Target Date  01/18/20      PT LONG TERM GOAL #2   Title  Patient will demonstrate walking tolerance increased to 30 minutes without increase in symptoms to demonstrate improved ability to perform walking program for cardiovascular health.    Baseline  10 minute tolerance (patient report); 12/22 10 minutes (per pt report); 11/09/2019: 7 min; 12/07/2019 unable to assess due to icy streets    Time  8    Period  Weeks    Status  Unable to assess    Target Date  01/18/20      PT LONG TERM GOAL #3   Title  Patient will report worst pain of 3/10 during therapy to demonstrate reduced  disability    Baseline  Worst 7/10; 12/22 worst 9/10; 11/09/2019: 9/10; 12/07/2019 7/10    Time  8    Period  Weeks    Status  On-going    Target Date  01/18/20      PT LONG TERM GOAL #4   Title  Patient will demonstrate ability to perform all bed mobility maneuvers without increase in pain for reduced disability with mobility and transfers with ADLs.  Baseline  Reports pain with bed mobility supine <> sidelying L/R, sidelying to sitting EOB; 12/22 pt able to perform I without increased pain    Time  8    Period  Weeks    Status  Achieved    Target Date  01/18/20      PT LONG TERM GOAL #5   Title  Patient will improve 10MW speed to 1.0 m/sec to meet norms for minimal fall risk and full community ambulation.    Baseline  0.88 m/sec; 12/22 0.96 m/sec; 11/09/2019: deferred; 12/07/2019 0.90 m/sec    Time  8    Period  Weeks    Status  On-going    Target Date  01/18/20      PT LONG TERM GOAL #6   Title  Patient will increase 6MWT by 200 ft to demonstrate improvement in functional capacity for transition to cardiac walking program    Baseline  6MWT = 1195; 1/7/20201: deferred; 12/07/2019 1095    Time  8    Period  Weeks    Status  On-going    Target Date  01/18/20            Plan - 01/18/20 0842    Clinical Impression Statement  Continued to focus on improving hip strength with exercises. Patient demosntrates improved technique with walking today, however has increased fatiguedduring the session requiring sitting rest breaks during the session. Decreased standing exercises performed today secondary to fatigue. Will continue to address these limitations thoruhgout the future session. Patient will benefit from further skilled therapy to return to prior level of function.    Personal Factors and Comorbidities  Age;Comorbidity 3+;Time since onset of injury/illness/exacerbation;Comorbidity 2    Comorbidities  MCI, HTN, CA, cataracts    Examination-Activity Limitations   Bend;Sit;Squat;Stairs;Stand;Sleep    Examination-Participation Restrictions  Cleaning;Community Activity    Stability/Clinical Decision Making  Evolving/Moderate complexity    Rehab Potential  Fair    Clinical Impairments Affecting Rehab Potential  MCI    PT Frequency  2x / week   Eval and 1 f/u visit   PT Duration  6 weeks    PT Treatment/Interventions  ADLs/Self Care Home Management;Therapeutic exercise;Patient/family education;Neuromuscular re-education;Therapeutic activities;Functional mobility training;Balance training;Manual techniques;Gait training;Stair training;Moist Heat;Electrical Stimulation;Cryotherapy;Taping;Dry needling;Energy conservation;Passive range of motion;Joint Manipulations    PT Next Visit Plan  Progress glute therex, pelvic-hip dissociation; check and progress HEP with stretches    PT Home Exercise Plan  lateral step ups, step ups, hip kickers ext/abd red theraband    Consulted and Agree with Plan of Care  Patient       Patient will benefit from skilled therapeutic intervention in order to improve the following deficits and impairments:  Decreased range of motion, Postural dysfunction, Pain, Abnormal gait, Decreased activity tolerance, Decreased coordination, Decreased endurance, Decreased balance, Decreased mobility, Difficulty walking, Hypomobility, Increased muscle spasms, Impaired perceived functional ability, Decreased strength, Impaired flexibility  Visit Diagnosis: Muscle weakness (generalized)  Pain in left hip  Pain in right hip     Problem List Patient Active Problem List   Diagnosis Date Noted  . 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 Bipolar disorder, in full remission, most recent episode depressed ([place]) 11/08/2019  . Avitaminosis D 08/24/2019  . Insomnia due to mental condition 05/31/2019  . Bipolar I disorder, most recent episode depressed ([place]) 05/31/2019  . Mild cognitive disorder 05/31/2019  . Alcohol use disorder, moderate, in sustained  remission ([place]) 05/31/2019  . Elevated tumor markers 05/27/2019  . GAD (generalized anxiety  disorder) 04/10/2019  . Altered mental status 04/10/2019  . MDD (major depressive disorder), severe ([place]) 01/31/2019  . High serum carbohydrate antigen 19-9 (CA19-9) 12/01/2018  . Adjustment disorder with anxious mood 11/24/2018  . Essential hypertension 11/24/2018  . Screening for lipid disorders 11/24/2018  . B12 deficiency 10/17/2018  . Incisional hernia, without obstruction or gangrene 09/12/2018  . Genetic testing 04/07/2018  . Family history of breast cancer 03/24/2018  . Family history of lung cancer 03/24/2018  . Malignant neoplasm of upper-outer quadrant of right breast in female, estrogen receptor positive ([place]) 03/17/2018  . Malignant neoplasm of upper-inner quadrant of right breast in female, estrogen receptor positive ([place]) 03/17/2018  . History of alcohol dependence ([place]) 02/07/2018  . History of psychosis 02/07/2018  . History of insomnia 02/07/2018    Blythe Stanford, PT DPT 01/18/2020, 9:04 AM  [facility] [address] [place], [address] Phone: [phone]   Fax:  [phone]  Name: Kendra [name] MRN: [id number] Date of Birth: 16-Apr-1946

## 2020-01-18 NOTE — Patient Instructions (Signed)
Elevated on table upper arm on about 3-4 books - then heating pad for elbow extention stretch 8 min  And 5 x 30 sec elbow extention stretch palm up Then same position 2 lbs for flexion and relax into extention for elbow - palm up and thumb up 2 x 12  reps  Teal med putty for gripping and lat grip 2 x 15 reps pain free  Putty 1 x day and elbow 2 x day

## 2020-01-19 ENCOUNTER — Other Ambulatory Visit: Payer: Self-pay | Admitting: Orthopedic Surgery

## 2020-01-19 DIAGNOSIS — M545 Low back pain, unspecified: Secondary | ICD-10-CM

## 2020-01-23 ENCOUNTER — Ambulatory Visit: Payer: Medicare PPO

## 2020-01-23 ENCOUNTER — Other Ambulatory Visit: Payer: Self-pay

## 2020-01-23 DIAGNOSIS — R262 Difficulty in walking, not elsewhere classified: Secondary | ICD-10-CM

## 2020-01-23 DIAGNOSIS — M25522 Pain in left elbow: Secondary | ICD-10-CM | POA: Diagnosis not present

## 2020-01-23 DIAGNOSIS — M6281 Muscle weakness (generalized): Secondary | ICD-10-CM | POA: Diagnosis not present

## 2020-01-23 DIAGNOSIS — M25551 Pain in right hip: Secondary | ICD-10-CM | POA: Diagnosis not present

## 2020-01-23 DIAGNOSIS — M25552 Pain in left hip: Secondary | ICD-10-CM | POA: Diagnosis not present

## 2020-01-23 DIAGNOSIS — M25622 Stiffness of left elbow, not elsewhere classified: Secondary | ICD-10-CM | POA: Diagnosis not present

## 2020-01-24 NOTE — Therapy (Signed)
[facility] [address], [place], [address] Phone: [phone]   Fax:  [phone]  Physical Therapy Treatment  Patient Details  Name: Kendra [name] MRN: [id number] Date of Birth: 12-Jun-1946 No data recorded  Encounter Date: 01/23/2020  PT End of Session - 01/24/20 1113    Visit Number  27    Number of Visits  30    Date for PT Re-Evaluation  01/18/20    Authorization Type  8/10    PT Start Time  1600    PT Stop Time  1645    PT Time Calculation (min)  45 min    Equipment Utilized During Treatment  Gait belt    Activity Tolerance  Patient tolerated treatment well;No increased pain    Behavior During Therapy  Flat affect       Past Medical History:  Diagnosis Date  . Anxiety   . Breast cancer ([place]) 03/2018   right breast cancer at 11:00 and 1:00  . Bursitis    bilateral hips and knees  . Complication of anesthesia   . Depression   . Family history of breast cancer 03/24/2018  . Family history of lung cancer 03/24/2018  . History of alcohol dependence ([place]) 2007   no ETOH; resolved since 2007  . History of hiatal hernia   . History of psychosis 2014   due to sleep disturbance  . Hypertension   . Personal history of radiation therapy 06/2018-07/2018   right breast ca  . PONV (postoperative nausea and vomiting)     Past Surgical History:  Procedure Laterality Date  . BREAST BIOPSY Right 03/14/2018   11:00 DCIS and invasive ductal carcinoma  . BREAST BIOPSY Right 03/14/2018   1:00 Invasive ductal carcinoma  . BREAST LUMPECTOMY Right 04/27/2018   lumpectomy of 11 and 1:00 cancers, clear margins, negative LN  . BREAST LUMPECTOMY WITH RADIOACTIVE SEED AND SENTINEL LYMPH NODE BIOPSY Right 04/27/2018   Procedure: RIGHT BREAST LUMPECTOMY WITH RADIOACTIVE SEED X 2 AND RIGHT SENTINEL LYMPH NODE BIOPSY;  Surgeon: [name], MD;  Location: [facility];  Service: General;   Laterality: Right;  . CATARACT EXTRACTION W/PHACO Left 08/30/2019   Procedure: CATARACT EXTRACTION PHACO AND INTRAOCULAR LENS PLACEMENT ([place]) LEFT panoptix toric  01:20.5  12.7%  10.26;  Surgeon: Leandrew Koyanagi, MD;  Location: [facility];  Service: Ophthalmology;  Laterality: Left;  . CATARACT EXTRACTION W/PHACO Right 09/20/2019   Procedure: CATARACT EXTRACTION PHACO AND INTRAOCULAR LENS PLACEMENT ([facility]) RIGHT 5.71  00:36.4  15.7%;  Surgeon: Leandrew Koyanagi, MD;  Location: [facility];  Service: Ophthalmology;  Laterality: Right;  . LAPAROTOMY N/A 02/10/2018   Procedure: EXPLORATORY LAPAROTOMY;  Surgeon: Isabel Caprice, MD;  Location[facility];  Service: Gynecology;  Laterality: N/A;  . MASS EXCISION  01/2018   abdominal  . OVARIAN CYST REMOVAL    . SALPINGOOPHORECTOMY Bilateral 02/10/2018   Procedure: BILATERAL SALPINGO OOPHORECTOMY; PERITONEAL WASHINGS;  Surgeon: Isabel Caprice, MD;  [facility];  Service: Gynecology;  Laterality: Bilateral;  . TONSILLECTOMY    . TONSILLECTOMY AND ADENOIDECTOMY  1953    There were no vitals filed for this visit.  Subjective Assessment - 01/23/20 1607    Subjective  Patient reports pain has been feeling about the same since the previous session. Patient state she was performing    Pertinent History  Patient report unclear regarding onset. Visited MD on Sept 16 and received injection with relief  for approximately 1 month. Reports she cannot receive an additional injection pending eye surgery. Received dx from MD of hip bursitis in B hips. Reports pain with walking, stanindg, sitting 5/10 current 7/10 worst 3/10 best, with temporary relief when she is sitting "just right" and with naproxen. Agg: activity. Cormorbidities: hx of breast cancer, HTN, MCI, insomnia, cataracts    Limitations  Sitting;Standing;Walking;House hold activities    How long can you sit comfortably?  Pain constant, reduced with neutral pelvic tilt    How long  can you stand comfortably?  Pain constant, increases with time    How long can you walk comfortably?  Pain constant, increases with time    Patient Stated Goals  Be able to move without hurting, walk 1 hour    Currently in Pain?  No/denies    Pain Onset  More than a month ago          TREATMENT Therapeutic Exercise Hip abduction in sitting iso with belt - x 5 sec - 2 x 10  Hip abduction with side stepping with UE support from therapist - 2 x10  Backwards amb - 48ft x 2 with UE support from therapist Sit to stand with Band around knees - x 20  Glute squeezes in standing - x 20 with 5 sec holds Education on use of band, posture, and pain modulation at home for exercise   PT Education - 01/24/20 1112    Education provided  Yes    Education Details  form/technique with exercise    Person(s) Educated  Patient    Methods  Explanation;Demonstration    Comprehension  Returned demonstration;Verbalized understanding       PT Short Term Goals - 09/12/19 1419      PT SHORT TERM GOAL #1   Title  Patient will demonstrate complaince with HEP to speed recovery and reduce total number of visits.    Baseline  HEP given    Time  2    Period  Weeks    Status  New    Target Date  09/27/19        PT Long Term Goals - 12/07/19 [id number]      PT LONG TERM GOAL #1   Title  Patient will improve hip strength to 4/5 grossly to facilitate improved mobility with ADLs.    Baseline  3, 4-; 12/22 deferred to NV; 11/09/2019: 4-/5; 12/07/2019 L hip flexor 4, abd and ext 3+    Time  8    Period  Weeks    Status  On-going    Target Date  01/18/20      PT LONG TERM GOAL #2   Title  Patient will demonstrate walking tolerance increased to 30 minutes without increase in symptoms to demonstrate improved ability to perform walking program for cardiovascular health.    Baseline  10 minute tolerance (patient report); 12/22 10 minutes (per pt report); 11/09/2019: 7 min; 12/07/2019 unable to assess due to icy streets     Time  8    Period  Weeks    Status  Unable to assess    Target Date  01/18/20      PT LONG TERM GOAL #3   Title  Patient will report worst pain of 3/10 during therapy to demonstrate reduced disability    Baseline  Worst 7/10; 12/22 worst 9/10; 11/09/2019: 9/10; 12/07/2019 7/10    Time  8    Period  Weeks    Status  On-going  Target Date  01/18/20      PT LONG TERM GOAL #4   Title  Patient will demonstrate ability to perform all bed mobility maneuvers without increase in pain for reduced disability with mobility and transfers with ADLs.    Baseline  Reports pain with bed mobility supine <> sidelying L/R, sidelying to sitting EOB; 12/22 pt able to perform I without increased pain    Time  8    Period  Weeks    Status  Achieved    Target Date  01/18/20      PT LONG TERM GOAL #5   Title  Patient will improve 10MW speed to 1.0 m/sec to meet norms for minimal fall risk and full community ambulation.    Baseline  0.88 m/sec; 12/22 0.96 m/sec; 11/09/2019: deferred; 12/07/2019 0.90 m/sec    Time  8    Period  Weeks    Status  On-going    Target Date  01/18/20      PT LONG TERM GOAL #6   Title  Patient will increase 6MWT by 200 ft to demonstrate improvement in functional capacity for transition to cardiac walking program    Baseline  6MWT = 1195; 1/7/20201: deferred; 12/07/2019 1095    Time  8    Period  Weeks    Status  On-going    Target Date  01/18/20            Plan - 01/24/20 1114    Clinical Impression Statement  Increased pain along the hip with performing side stepping and backwards walking which was decreased after performing isometric hip abduction. More improvement with use of belt for resistance versus hands for resistance secondary to UE weakness of the patient. Educated patient to perform this after performing exercises. Patient will benefit from further skilled therapy to return to prior level of function.    Personal Factors and Comorbidities  Age;Comorbidity 3+;Time since  onset of injury/illness/exacerbation;Comorbidity 2    Comorbidities  MCI, HTN, CA, cataracts    Examination-Activity Limitations  Bend;Sit;Squat;Stairs;Stand;Sleep    Examination-Participation Restrictions  Cleaning;Community Activity    Stability/Clinical Decision Making  Evolving/Moderate complexity    Rehab Potential  Fair    Clinical Impairments Affecting Rehab Potential  MCI    PT Frequency  2x / week   Eval and 1 f/u visit   PT Duration  6 weeks    PT Treatment/Interventions  ADLs/Self Care Home Management;Therapeutic exercise;Patient/family education;Neuromuscular re-education;Therapeutic activities;Functional mobility training;Balance training;Manual techniques;Gait training;Stair training;Moist Heat;Electrical Stimulation;Cryotherapy;Taping;Dry needling;Energy conservation;Passive range of motion;Joint Manipulations    PT Next Visit Plan  Progress glute therex, pelvic-hip dissociation; check and progress HEP with stretches    PT Home Exercise Plan  lateral step ups, step ups, hip kickers ext/abd red theraband    Consulted and Agree with Plan of Care  Patient       Patient will benefit from skilled therapeutic intervention in order to improve the following deficits and impairments:  Decreased range of motion, Postural dysfunction, Pain, Abnormal gait, Decreased activity tolerance, Decreased coordination, Decreased endurance, Decreased balance, Decreased mobility, Difficulty walking, Hypomobility, Increased muscle spasms, Impaired perceived functional ability, Decreased strength, Impaired flexibility  Visit Diagnosis: Muscle weakness (generalized)  Difficulty in walking, not elsewhere classified     Problem List Patient Active Problem List   Diagnosis Date Noted  . 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 Bipolar disorder, in full remission, most recent episode depressed ([place]) 11/08/2019  . Avitaminosis D 08/24/2019  . Insomnia due to mental condition  05/31/2019  . Bipolar I  disorder, most recent episode depressed ([place]) 05/31/2019  . Mild cognitive disorder 05/31/2019  . Alcohol use disorder, moderate, in sustained remission ([place]) 05/31/2019  . Elevated tumor markers 05/27/2019  . GAD (generalized anxiety disorder) 04/10/2019  . Altered mental status 04/10/2019  . MDD (major depressive disorder), severe ([place]) 01/31/2019  . High serum carbohydrate antigen 19-9 (CA19-9) 12/01/2018  . Adjustment disorder with anxious mood 11/24/2018  . Essential hypertension 11/24/2018  . Screening for lipid disorders 11/24/2018  . B12 deficiency 10/17/2018  . Incisional hernia, without obstruction or gangrene 09/12/2018  . Genetic testing 04/07/2018  . Family history of breast cancer 03/24/2018  . Family history of lung cancer 03/24/2018  . Malignant neoplasm of upper-outer quadrant of right breast in female, estrogen receptor positive ([place]) 03/17/2018  . Malignant neoplasm of upper-inner quadrant of right breast in female, estrogen receptor positive ([place]) 03/17/2018  . History of alcohol dependence ([place]) 02/07/2018  . History of psychosis 02/07/2018  . History of insomnia 02/07/2018    Blythe Stanford, PT DPT 01/24/2020, 11:19 AM  [facility] [address] [place], [address] Phone: [phone]   Fax:  [phone]  Name: Kendra [name] MRN: [id number] Date of Birth: [birth year]

## 2020-01-25 ENCOUNTER — Ambulatory Visit: Payer: Medicare PPO | Admitting: Occupational Therapy

## 2020-01-25 ENCOUNTER — Other Ambulatory Visit: Payer: Self-pay

## 2020-01-25 DIAGNOSIS — M6281 Muscle weakness (generalized): Secondary | ICD-10-CM

## 2020-01-25 DIAGNOSIS — M25522 Pain in left elbow: Secondary | ICD-10-CM | POA: Diagnosis not present

## 2020-01-25 DIAGNOSIS — M25552 Pain in left hip: Secondary | ICD-10-CM | POA: Diagnosis not present

## 2020-01-25 DIAGNOSIS — M25622 Stiffness of left elbow, not elsewhere classified: Secondary | ICD-10-CM | POA: Diagnosis not present

## 2020-01-25 DIAGNOSIS — M25551 Pain in right hip: Secondary | ICD-10-CM | POA: Diagnosis not present

## 2020-01-25 DIAGNOSIS — R262 Difficulty in walking, not elsewhere classified: Secondary | ICD-10-CM | POA: Diagnosis not present

## 2020-01-25 NOTE — Patient Instructions (Signed)
See note

## 2020-01-25 NOTE — Therapy (Signed)
[facility] [address], [place], [address] Phone: [phone]   Fax:  [phone]  Occupational Therapy Treatment  Patient Details  Name: Kendra [name] MRN: [id number] Date of Birth: [birth year] Referring Provider (OT): Vance Peper Date: 01/25/2020  OT End of Session - 01/25/20 1233    Visit Number  4    Number of Visits  8    Date for OT Re-Evaluation  02/14/20    OT Start Time  1132    OT Stop Time  1217    OT Time Calculation (min)  45 min    Activity Tolerance  Patient tolerated treatment well    Behavior During Therapy  Flat affect       Past Medical History:  Diagnosis Date  . Anxiety   . Breast cancer ([place]) 03/2018   right breast cancer at 11:00 and 1:00  . Bursitis    bilateral hips and knees  . Complication of anesthesia   . Depression   . Family history of breast cancer 03/24/2018  . Family history of lung cancer 03/24/2018  . History of alcohol dependence ([place]) 2007   no ETOH; resolved since 2007  . History of hiatal hernia   . History of psychosis 2014   due to sleep disturbance  . Hypertension   . Personal history of radiation therapy 06/2018-07/2018   right breast ca  . PONV (postoperative nausea and vomiting)     Past Surgical History:  Procedure Laterality Date  . BREAST BIOPSY Right 03/14/2018   11:00 DCIS and invasive ductal carcinoma  . BREAST BIOPSY Right 03/14/2018   1:00 Invasive ductal carcinoma  . BREAST LUMPECTOMY Right 04/27/2018   lumpectomy of 11 and 1:00 cancers, clear margins, negative LN  . BREAST LUMPECTOMY WITH RADIOACTIVE SEED AND SENTINEL LYMPH NODE BIOPSY Right 04/27/2018   Procedure: RIGHT BREAST LUMPECTOMY WITH RADIOACTIVE SEED X 2 AND RIGHT SENTINEL LYMPH NODE BIOPSY;  Surgeon: [name], MD;  Location: [facility];  Service: General;  Laterality: Right;  . CATARACT EXTRACTION W/PHACO Left 08/30/2019   Procedure:  CATARACT EXTRACTION PHACO AND INTRAOCULAR LENS PLACEMENT ([place]) LEFT panoptix toric  01:20.5  12.7%  10.26;  Surgeon: Leandrew Koyanagi, MD;  Location: [facility];  Service: Ophthalmology;  Laterality: Left;  . CATARACT EXTRACTION W/PHACO Right 09/20/2019   Procedure: CATARACT EXTRACTION PHACO AND INTRAOCULAR LENS PLACEMENT ([facility]) RIGHT 5.71  00:36.4  15.7%;  Surgeon: Leandrew Koyanagi, MD;  Location: [facility];  Service: Ophthalmology;  Laterality: Right;  . LAPAROTOMY N/A 02/10/2018   Procedure: EXPLORATORY LAPAROTOMY;  Surgeon: Isabel Caprice, MD;  [facility];  Service: Gynecology;  Laterality: N/A;  . MASS EXCISION  01/2018   abdominal  . OVARIAN CYST REMOVAL    . SALPINGOOPHORECTOMY Bilateral 02/10/2018   Procedure: BILATERAL SALPINGO OOPHORECTOMY; PERITONEAL WASHINGS;  Surgeon: Isabel Caprice, MD;  [facility];  Service: Gynecology;  Laterality: Bilateral;  . TONSILLECTOMY    . TONSILLECTOMY AND ADENOIDECTOMY  1953    There were no vitals filed for this visit.  Subjective Assessment - 01/25/20 1232    Subjective   I am using my arm more - if it hurts when doing something - I stop - 2lbs weight still heavy for my arm and putty still mediam difficulty    Pertinent History  Pt fell 11/23/2019 and fracture her R elbow - minimal displaced radius head fx - pt in sling if  she is out and about - but refer to OT for ROM ,PROM and weight limited 1-2 lbs -    Patient Stated Goals  I would like the ROM , strength better so I can push up from chair and have no pain    Currently in Pain?  No/denies         [facility] OT Assessment - 01/25/20 0001      Strength   Left Hand Grip (lbs)  21    Left Hand Lateral Pinch  11 lbs    Left Hand 3 Point Pinch  9 lbs       elbow extention L and R -8         OT Treatments/Exercises (OP) - 01/25/20 0001      Moist Heat Therapy   Number Minutes Moist Heat  8 Minutes    Moist Heat Location  --   elbow extention  stretch 6 min       Review again with pt correct positioning of L upper arm during elbow extention stretch - to increase extention at home - pt demo correct Elevate upper arm - review stretching at home-focus on supination positioning with elbow extended5 x 30 sec after heating pad stretch done     2 lbs20-30reps elbow flexion and extention same position - with focus on end range elbow extention - to do palm up and thumb up -  2x day  Pt to increase to 3 sets- had less discomfort from extention into flexion - end range   Add 1/2 firm green to her  teal putty to HEP for grip - can stop lat and 3 point  Can do 2 sets  12-15 reps  1 x day  And increase to 3 sets   Wall pushup done this date - pain free 2 x 10 reps  pt to do at home and increase gradually over next 9 days - to 3 sets pain free Done this date Biodex for elbow extention bar- 2 x 15 reps 5 lbs  And 2x 15 reps rows 10 lbs  And 15 reps chest press - 5 lbs  armbike 4 min - changing direction - pain free -        OT Education - 01/25/20 1233    Education Details  HEP changes    Person(s) Educated  Patient    Methods  Explanation;Demonstration;Tactile cues;Verbal cues;Handout    Comprehension  Verbal cues required;Returned demonstration;Verbalized understanding       OT Short Term Goals - 12/20/19 2028      OT SHORT TERM GOAL #1   Title  Pt to be independent in HEP to increase L elbow extention to -5 and increase strenght to carry more than 5 lbs    Baseline  no knowledge of HEP    Time  4    Period  Weeks    Status  New    Target Date  01/17/20        OT Long Term Goals - 12/20/19 2029      OT LONG TERM GOAL #1   Title  L Elbow extention improve for pt to pull shirt over head, push up and carry more than 8 lbs without increase symptoms    Baseline  -22 extention of L elbow and weight limit 2 lbs - pushing up by accident pain 5/10    Time  8    Period  Weeks    Status  New    Target Date  02/14/20      OT LONG TERM GOAL #2   Title  Pt AROM and strength in L elbow improve to prior level for function    Baseline  -22 extention , weight 2 lbs limited - cannot do makidng bed, flowers, dry back , carry more than 2 lbs , no pushing up    Time  8    Period  Weeks    Status  New    Target Date  02/14/20            Plan - 01/25/20 1233    Clinical Impression Statement  Pt is 8 1/2 wks out from minimal fx of radius head fx- pt show increase strength in session -and extention of elbow -8 but it appear same on the R - increase grip and prehension strength- pt to increase reps and sets and add this date wall pushups pain free and increase resistance for putty    OT Occupational Profile and History  Problem Focused Assessment - Including review of records relating to presenting problem    Occupational performance deficits (Please refer to evaluation for details):  ADL's;IADL's;Play;Leisure;Social Participation    Body Structure / Function / Physical Skills  ADL;Flexibility;ROM;UE functional use;Pain;Strength;IADL    Rehab Potential  Good    Clinical Decision Making  Limited treatment options, no task modification necessary    Comorbidities Affecting Occupational Performance:  May have comorbidities impacting occupational performance    Modification or Assistance to Complete Evaluation   No modification of tasks or assist necessary to complete eval    OT Frequency  1x / week    OT Duration  4 weeks    OT Treatment/Interventions  Self-care/ADL training;Therapeutic exercise;Patient/family education;Cryotherapy;Paraffin;Moist Heat;Manual Therapy;Passive range of motion;Therapeutic activities    Plan  assess progress with HEP    OT Home Exercise Plan  see pt instruction    Consulted and Agree with Plan of Care  Patient       Patient will benefit from skilled therapeutic intervention in order to improve the following deficits and impairments:   Body Structure / Function / Physical  Skills: ADL, Flexibility, ROM, UE functional use, Pain, Strength, IADL       Visit Diagnosis: Stiffness of left elbow, not elsewhere classified  Pain in left elbow  Muscle weakness (generalized)    Problem List Patient Active Problem List   Diagnosis Date Noted  . 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 Bipolar disorder, in full remission, most recent episode depressed ([place]) 11/08/2019  . Avitaminosis D 08/24/2019  . Insomnia due to mental condition 05/31/2019  . Bipolar I disorder, most recent episode depressed ([place]) 05/31/2019  . Mild cognitive disorder 05/31/2019  . Alcohol use disorder, moderate, in sustained remission ([place]) 05/31/2019  . Elevated tumor markers 05/27/2019  . GAD (generalized anxiety disorder) 04/10/2019  . Altered mental status 04/10/2019  . MDD (major depressive disorder), severe ([place]) 01/31/2019  . High serum carbohydrate antigen 19-9 (CA19-9) 12/01/2018  . Adjustment disorder with anxious mood 11/24/2018  . Essential hypertension 11/24/2018  . Screening for lipid disorders 11/24/2018  . B12 deficiency 10/17/2018  . Incisional hernia, without obstruction or gangrene 09/12/2018  . Genetic testing 04/07/2018  . Family history of breast cancer 03/24/2018  . Family history of lung cancer 03/24/2018  . Malignant neoplasm of upper-outer quadrant of right breast in female, estrogen receptor positive ([place]) 03/17/2018  . Malignant neoplasm of upper-inner quadrant of right breast in female, estrogen receptor positive ([place]) 03/17/2018  . History of  alcohol dependence ([place]) 02/07/2018  . History of psychosis 02/07/2018  . History of insomnia 02/07/2018    Rosalyn Gess OTR/L,CLT 01/25/2020, 12:36 PM  [facility] [address] [place], [address] Phone: [phone]   Fax:  [phone]  Name: Curley Grgas MRN: [id number] Date of Birth: 01/08/1946

## 2020-01-29 ENCOUNTER — Other Ambulatory Visit: Payer: Self-pay

## 2020-01-29 ENCOUNTER — Ambulatory Visit: Payer: Medicare PPO

## 2020-01-29 DIAGNOSIS — R262 Difficulty in walking, not elsewhere classified: Secondary | ICD-10-CM | POA: Diagnosis not present

## 2020-01-29 DIAGNOSIS — M6281 Muscle weakness (generalized): Secondary | ICD-10-CM

## 2020-01-29 DIAGNOSIS — M25552 Pain in left hip: Secondary | ICD-10-CM | POA: Diagnosis not present

## 2020-01-29 DIAGNOSIS — M25551 Pain in right hip: Secondary | ICD-10-CM

## 2020-01-29 DIAGNOSIS — M25622 Stiffness of left elbow, not elsewhere classified: Secondary | ICD-10-CM | POA: Diagnosis not present

## 2020-01-29 DIAGNOSIS — M25522 Pain in left elbow: Secondary | ICD-10-CM | POA: Diagnosis not present

## 2020-01-29 NOTE — Therapy (Signed)
[facility] [address] [place], [address] Phone: [phone]   Fax:  [phone]  Physical Therapy Treatment/Progress Note  Patient Details  Name: Kendra [name] MRN: [id number] Date of Birth: 1946/08/13 No data recorded   Reporting Period: 12/15/2019- 01/29/2020  Encounter Date: 01/29/2020  PT End of Session - 01/29/20 0957    Visit Number  28    Number of Visits  30    Date for PT Re-Evaluation  01/18/20    Authorization Type  9/10    PT Start Time  0945    PT Stop Time  1030    PT Time Calculation (min)  45 min    Equipment Utilized During Treatment  Gait belt    Activity Tolerance  Patient tolerated treatment well;No increased pain    Behavior During Therapy  Flat affect       Past Medical History:  Diagnosis Date  . Anxiety   . Breast cancer ([place]) 03/2018   right breast cancer at 11:00 and 1:00  . Bursitis    bilateral hips and knees  . Complication of anesthesia   . Depression   . Family history of breast cancer 03/24/2018  . Family history of lung cancer 03/24/2018  . History of alcohol dependence ([place]) 2007   no ETOH; resolved since 2007  . History of hiatal hernia   . History of psychosis 2014   due to sleep disturbance  . Hypertension   . Personal history of radiation therapy 06/2018-07/2018   right breast ca  . PONV (postoperative nausea and vomiting)     Past Surgical History:  Procedure Laterality Date  . BREAST BIOPSY Right 03/14/2018   11:00 DCIS and invasive ductal carcinoma  . BREAST BIOPSY Right 03/14/2018   1:00 Invasive ductal carcinoma  . BREAST LUMPECTOMY Right 04/27/2018   lumpectomy of 11 and 1:00 cancers, clear margins, negative LN  . BREAST LUMPECTOMY WITH RADIOACTIVE SEED AND SENTINEL LYMPH NODE BIOPSY Right 04/27/2018   Procedure: RIGHT BREAST LUMPECTOMY WITH RADIOACTIVE SEED X 2 AND RIGHT SENTINEL LYMPH NODE BIOPSY;  Surgeon: [name], MD;  Location:  [facility];  Service: General;  Laterality: Right;  . CATARACT EXTRACTION W/PHACO Left 08/30/2019   Procedure: CATARACT EXTRACTION PHACO AND INTRAOCULAR LENS PLACEMENT ([place]) LEFT panoptix toric  01:20.5  12.7%  10.26;  Surgeon: Leandrew Koyanagi, MD;  Location: [facility];  Service: Ophthalmology;  Laterality: Left;  . CATARACT EXTRACTION W/PHACO Right 09/20/2019   Procedure: CATARACT EXTRACTION PHACO AND INTRAOCULAR LENS PLACEMENT ([facility]) RIGHT 5.71  00:36.4  15.7%;  Surgeon: Leandrew Koyanagi, MD;  Location: [facility];  Service: Ophthalmology;  Laterality: Right;  . LAPAROTOMY N/A 02/10/2018   Procedure: EXPLORATORY LAPAROTOMY;  Surgeon: Isabel Caprice, MD;  Location: [facility];  Service: Gynecology;  Laterality: N/A;  . MASS EXCISION  01/2018   abdominal  . OVARIAN CYST REMOVAL    . SALPINGOOPHORECTOMY Bilateral 02/10/2018   Procedure: BILATERAL SALPINGO OOPHORECTOMY; PERITONEAL WASHINGS;  Surgeon: Isabel Caprice, MD;  Location: [facility];  Service: Gynecology;  Laterality: Bilateral;  . TONSILLECTOMY    . TONSILLECTOMY AND ADENOIDECTOMY  1953    There were no vitals filed for this visit.  Subjective Assessment - 01/29/20 0952    Subjective  Patient states her hip does not hurt currently. Patient states she has been performing her exercises.    Pertinent History  Patient report unclear regarding onset. Visited MD on Sept 16  and received injection with relief for approximately 1 month. Reports she cannot receive an additional injection pending eye surgery. Received dx from MD of hip bursitis in B hips. Reports pain with walking, stanindg, sitting 5/10 current 7/10 worst 3/10 best, with temporary relief when she is sitting "just right" and with naproxen. Agg: activity. Cormorbidities: hx of breast cancer, HTN, MCI, insomnia, cataracts    Limitations  Sitting;Standing;Walking;House hold activities    How long can you sit comfortably?  Pain constant, reduced  with neutral pelvic tilt    How long can you stand comfortably?  Pain constant, increases with time    How long can you walk comfortably?  Pain constant, increases with time    Patient Stated Goals  Be able to move without hurting, walk 1 hour    Currently in Pain?  No/denies    Pain Onset  More than a month ago          TREATMENT Therapeutic Exercise Hip abduction with side stepping with UE support from therapist - 2 x10  Sit to stands with YTB around knees - x 10  Backwards amb - 71ft x 4with UE support from therapist Sit to stands for speed - x 5 Single leg stance - x 10 Hip abduction in sitting iso with belt - x 5 sec - 2 x 10  Education on use of band, posture, and pain modulation at home for exercise    PT Education - 01/29/20 0956    Education provided  Yes    Education Details  form/technique with exercise    Person(s) Educated  Patient    Methods  Explanation;Demonstration    Comprehension  Verbalized understanding;Returned demonstration       PT Short Term Goals - 09/12/19 1419      PT SHORT TERM GOAL #1   Title  Patient will demonstrate complaince with HEP to speed recovery and reduce total number of visits.    Baseline  HEP given    Time  2    Period  Weeks    Status  New    Target Date  09/27/19        PT Long Term Goals - 01/29/20 1015      PT LONG TERM GOAL #1   Title  Patient will improve hip strength to 4/5 grossly to facilitate improved mobility with ADLs.    Baseline  3, 4-; 12/22 deferred to NV; 11/09/2019: 4-/5; 12/07/2019 L hip flexor 4, abd and ext 3+; 01/29/2020: 4/5 for most hip motions, 3+/5 for abd, ext    Time  8    Period  Weeks    Status  On-going      PT LONG TERM GOAL #2   Title  Patient will demonstrate walking tolerance increased to 30 minutes without increase in symptoms to demonstrate improved ability to perform walking program for cardiovascular health.    Baseline  10 minute tolerance (patient report); 12/22 10 minutes (per pt  report); 11/09/2019: 7 min; 12/07/2019 unable to assess due to icy streets;  01/29/2020: 10 min    Time  8    Period  Weeks    Status  On-going      PT LONG TERM GOAL #3   Title  Patient will report worst pain of 3/10 during therapy to demonstrate reduced disability    Baseline  Worst 7/10; 12/22 worst 9/10; 11/09/2019: 9/10; 12/07/2019 7/10; 01/29/2020: 7/10    Time  8    Period  [place]  Status  On-going      PT LONG TERM GOAL #4   Title  Patient will demonstrate ability to perform all bed mobility maneuvers without increase in pain for reduced disability with mobility and transfers with ADLs.    Baseline  Reports pain with bed mobility supine <> sidelying L/R, sidelying to sitting EOB; 12/22 pt able to perform I without increased pain    Time  8    Period  Weeks    Status  Achieved      PT LONG TERM GOAL #5   Title  Patient will improve 10MW speed to 1.0 m/sec to meet norms for minimal fall risk and full community ambulation.    Baseline  0.88 m/sec; 12/22 0.96 m/sec; 11/09/2019: deferred; 12/07/2019 0.90 m/sec; 01/29/2020: .9  m/sec    Time  8    Period  Weeks    Status  On-going      Additional Long Term Goals   Additional Long Term Goals  Yes      PT LONG TERM GOAL #6   Title  Patient will increase 6MWT by 200 ft to demonstrate improvement in functional capacity for transition to cardiac walking program    Baseline  6MWT = 1195; 1/7/20201: deferred; 12/07/2019 1095; 01/29/2020: deferred secondary to fatigue    Time  8    Period  Weeks    Status  Deferred      PT LONG TERM GOAL #7   Title  Patient will improve her 5xsts to under 15 sec to indicate singificnat improvement in LE functional strength and decrease in fall risk.    Baseline  24.5 sec    Time  6    Period  Weeks    Status  New            Plan - 01/29/20 1538    Clinical Impression Statement  Patient has made limited improvement with functional goal assessment, continues to have increased pain with transfers although  this is not consistent with symptoms experienced during the session. Patient however does demonstrates improvement with exercises performed with ability to tolerate the use of a theraband for increased resistance compared to previous sessions where this was not tolerated. Patient however does demonstrate a decreased 5xSTS time indicating poor functional strength and patient will benefit from further skilled therapy focused on improving these limitations to return to prior level of function.    Personal Factors and Comorbidities  Age;Comorbidity 3+;Time since onset of injury/illness/exacerbation;Comorbidity 2    Comorbidities  MCI, HTN, CA, cataracts    Examination-Activity Limitations  Bend;Sit;Squat;Stairs;Stand;Sleep    Examination-Participation Restrictions  Cleaning;Community Activity    Stability/Clinical Decision Making  Evolving/Moderate complexity    Rehab Potential  Fair    Clinical Impairments Affecting Rehab Potential  MCI    PT Frequency  2x / week   Eval and 1 f/u visit   PT Duration  6 weeks    PT Treatment/Interventions  ADLs/Self Care Home Management;Therapeutic exercise;Patient/family education;Neuromuscular re-education;Therapeutic activities;Functional mobility training;Balance training;Manual techniques;Gait training;Stair training;Moist Heat;Electrical Stimulation;Cryotherapy;Taping;Dry needling;Energy conservation;Passive range of motion;Joint Manipulations    PT Next Visit Plan  Progress glute therex, pelvic-hip dissociation; check and progress HEP with stretches    PT Home Exercise Plan  lateral step ups, step ups, hip kickers ext/abd red theraband    Consulted and Agree with Plan of Care  Patient       Patient will benefit from skilled therapeutic intervention in order to improve the following deficits and impairments:  Decreased range of motion, Postural dysfunction, Pain, Abnormal gait, Decreased activity tolerance, Decreased coordination, Decreased endurance, Decreased  balance, Decreased mobility, Difficulty walking, Hypomobility, Increased muscle spasms, Impaired perceived functional ability, Decreased strength, Impaired flexibility  Visit Diagnosis: Muscle weakness (generalized)  Difficulty in walking, not elsewhere classified  Pain in left hip  Pain in right hip     Problem List Patient Active Problem List   Diagnosis Date Noted  . 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 Bipolar disorder, in full remission, most recent episode depressed ([place]) 11/08/2019  . Avitaminosis D 08/24/2019  . Insomnia due to mental condition 05/31/2019  . Bipolar I disorder, most recent episode depressed ([place]) 05/31/2019  . Mild cognitive disorder 05/31/2019  . Alcohol use disorder, moderate, in sustained remission ([place]) 05/31/2019  . Elevated tumor markers 05/27/2019  . GAD (generalized anxiety disorder) 04/10/2019  . Altered mental status 04/10/2019  . MDD (major depressive disorder), severe ([place]) 01/31/2019  . High serum carbohydrate antigen 19-9 (CA19-9) 12/01/2018  . Adjustment disorder with anxious mood 11/24/2018  . Essential hypertension 11/24/2018  . Screening for lipid disorders 11/24/2018  . B12 deficiency 10/17/2018  . Incisional hernia, without obstruction or gangrene 09/12/2018  . Genetic testing 04/07/2018  . Family history of breast cancer 03/24/2018  . Family history of lung cancer 03/24/2018  . Malignant neoplasm of upper-outer quadrant of right breast in female, estrogen receptor positive ([place]) 03/17/2018  . Malignant neoplasm of upper-inner quadrant of right breast in female, estrogen receptor positive ([place]) 03/17/2018  . History of alcohol dependence ([place]) 02/07/2018  . History of psychosis 02/07/2018  . History of insomnia 02/07/2018    Blythe Stanford, PT DPT 01/29/2020, 3:44 PM  [facility] [address], [place], [address] Phone: [phone]   Fax:   [phone]  Name: Kendra [name] MRN: [id number] Date of Birth: December 15, 1945

## 2020-01-30 ENCOUNTER — Encounter: Payer: Self-pay | Admitting: Radiation Oncology

## 2020-01-30 ENCOUNTER — Other Ambulatory Visit: Payer: Self-pay

## 2020-01-30 ENCOUNTER — Ambulatory Visit (INDEPENDENT_AMBULATORY_CARE_PROVIDER_SITE_OTHER): Payer: Medicare PPO | Admitting: Licensed Clinical Social Worker

## 2020-01-30 ENCOUNTER — Encounter: Payer: Self-pay | Admitting: Licensed Clinical Social Worker

## 2020-01-30 DIAGNOSIS — F3176 Bipolar disorder, in full remission, most recent episode depressed: Secondary | ICD-10-CM

## 2020-01-30 DIAGNOSIS — F411 Generalized anxiety disorder: Secondary | ICD-10-CM | POA: Diagnosis not present

## 2020-01-30 NOTE — Progress Notes (Signed)
Virtual Visit via Telephone Note  I connected with [name] on 01/30/20 at  2:30 PM EDT by telephone and verified that I am speaking with the correct person using two identifiers.   I discussed the limitations, risks, security and privacy concerns of performing an evaluation and management service by telephone and the availability of in person appointments. I also discussed with the patient that there may be a patient responsible charge related to this service. The patient expressed understanding and agreed to proceed    I discussed the assessment and treatment plan with the patient. The patient was provided an opportunity to ask questions and all were answered. The patient agreed with the plan and demonstrated an understanding of the instructions.   The patient was advised to call back or seek an in-person evaluation if the symptoms worsen or if the condition fails to improve as anticipated.  I provided 30 minutes of non-face-to-face time during this encounter.   Alden Hipp, LCSW    THERAPIST PROGRESS NOTE  Session Time: 1430  Participation Level: Active  Behavioral Response: CasualAlertAnxious  Type of Therapy: Individual Therapy  Treatment Goals addressed: Coping  Interventions: Supportive  Summary: Nikyla Priem is a 74 y.o. female who presents with continued symptoms related to her diagnosis. Lucerito reports doing well since our last session. She reports ongoing anxiety around her medical problems. LCSW validated Willean's feelings around these issues, and encouraged her to utilize CBT skills to challenge the anxious, negative thoughts as they come up. We walked through several examples of how to do this appropriately, and Fred expressed understanding and agreement. Idalee reports feeling anxious that she would not be able to make it from one appointment to the next tomorrow, as she has two back to back. LCSW validated Mileidy's feelings and encouraged her to utilize the CBT  skills we discussed earlier, as well as utilizing past evidence that things are not as bad as we often think they are. Zamya expressed understanding and agreement with all information presented during today's session.  Suicidal/Homicidal: No  Therapist Response: Candas continues to work towards her tx goals but has not yet reached them. We will continue to work on improving emotional regulation skills and distress tolerance moving forward.   Plan: Return again in 2 weeks.  Diagnosis: Axis I: Generalized Anxiety Disorder    Axis II: No diagnosis    Alden Hipp, LCSW 01/30/2020

## 2020-01-31 ENCOUNTER — Other Ambulatory Visit: Payer: Self-pay | Admitting: *Deleted

## 2020-01-31 ENCOUNTER — Ambulatory Visit
Admission: RE | Admit: 2020-01-31 | Discharge: 2020-01-31 | Disposition: A | Discharge status: Home / Self Care | Payer: Medicare PPO | Source: Ambulatory Visit | Attending: Orthopedic Surgery | Admitting: Orthopedic Surgery

## 2020-01-31 ENCOUNTER — Ambulatory Visit
Admission: RE | Admit: 2020-01-31 | Discharge: 2020-01-31 | Disposition: A | Discharge status: Home / Self Care | Payer: Medicare PPO | Source: Ambulatory Visit | Attending: Radiation Oncology | Admitting: Radiation Oncology

## 2020-01-31 VITALS — BP 138/75 | HR 83 | Temp 96.1°F | Resp 16 | Wt 204.0 lb

## 2020-01-31 DIAGNOSIS — Z923 Personal history of irradiation: Secondary | ICD-10-CM | POA: Insufficient documentation

## 2020-01-31 DIAGNOSIS — M545 Low back pain, unspecified: Secondary | ICD-10-CM

## 2020-01-31 DIAGNOSIS — Z79811 Long term (current) use of aromatase inhibitors: Secondary | ICD-10-CM | POA: Diagnosis not present

## 2020-01-31 DIAGNOSIS — Z170 Estrogen receptor positive status [ER+]: Secondary | ICD-10-CM | POA: Insufficient documentation

## 2020-01-31 DIAGNOSIS — C50411 Malignant neoplasm of upper-outer quadrant of right female breast: Secondary | ICD-10-CM

## 2020-01-31 DIAGNOSIS — R21 Rash and other nonspecific skin eruption: Secondary | ICD-10-CM | POA: Diagnosis not present

## 2020-01-31 MED ORDER — KETOCONAZOLE 2 % EX CREA: 1.0000 "application " | TOPICAL_CREAM | Freq: Every day | CUTANEOUS | 0 refills | Status: DC

## 2020-01-31 NOTE — Progress Notes (Signed)
Radiation Oncology Follow up Note  Name: Kendra [name]   Date:   01/31/2020 MRN:  [id number] DOB: [birth year]    This 74 y.o. female presents to the clinic today for 36-month follow-up status post whole breast radiation to right breast for stage IIa invasive mammary carcinoma ER/PR positive.  REFERRING PROVIDER: Virginia Crews, MD  HPI: Patient is a 74 year old female now about 18 months having completed whole breast radiation to her right breast for stage IIa invasive mammary carcinoma ER/PR positive seen today in routine follow-up she is doing well she is a slight rash in the inner lower quadrant of her breast may be a fungal infection.  She otherwise specifically denies breast tenderness cough or bone pain..  She is currently on letrozole tolerating that well without side effect.  She had a mammogram back in August which I have reviewed BI-RADS 2 benign.  COMPLICATIONS OF TREATMENT: none  FOLLOW UP COMPLIANCE: keeps appointments   PHYSICAL EXAM:  BP 138/75   Pulse 83   Temp (!) 96.1 F (35.6 C) (Tympanic)   Resp 16   Wt 204 lb (92.5 kg)   BMI 32.93 kg/m  Lungs are clear to A&P cardiac examination essentially unremarkable with regular rate and rhythm. No dominant mass or nodularity is noted in either breast in 2 positions examined. Incision is well-healed. No axillary or supraclavicular adenopathy is appreciated. Cosmetic result is excellent.  There is a slight plaque like erythematous lesion in the lower inner quadrant of the right breast possibly fungal in origin.  Well-developed well-nourished patient in NAD. HEENT reveals PERLA, EOMI, discs not visualized.  Oral cavity is clear. No oral mucosal lesions are identified. Neck is clear without evidence of cervical or supraclavicular adenopathy. Lungs are clear to A&P. Cardiac examination is essentially unremarkable with regular rate and rhythm without murmur rub or thrill. Abdomen is benign with no organomegaly or masses noted.  Motor sensory and DTR levels are equal and symmetric in the upper and lower extremities. Cranial nerves II through XII are grossly intact. Proprioception is intact. No peripheral adenopathy or edema is identified. No motor or sensory levels are noted. Crude visual fields are within normal range.  RADIOLOGY RESULTS: Mammograms reviewed compatible with above-stated findings  PLAN: At the present time patient is doing well no evidence of disease.  And pleased with her overall progress.  I have prescribed some ketoconazole cream for this area which I believe the fungal infection in her right breast.  I have advised her if that does not respond I would see a dermatologist.  I have asked to see her back in 1 year for follow-up.  Patient knows to call with any concerns at any time.  She continues on letrozole without side effect.  I would like to take this opportunity to thank you for allowing me to participate in the care of your patient.Noreene Filbert, MD

## 2020-02-01 ENCOUNTER — Ambulatory Visit: Payer: Medicare PPO | Attending: Family Medicine

## 2020-02-01 ENCOUNTER — Other Ambulatory Visit: Payer: Self-pay

## 2020-02-01 DIAGNOSIS — M25522 Pain in left elbow: Secondary | ICD-10-CM | POA: Diagnosis not present

## 2020-02-01 DIAGNOSIS — S52122D Displaced fracture of head of left radius, subsequent encounter for closed fracture with routine healing: Secondary | ICD-10-CM | POA: Diagnosis not present

## 2020-02-01 DIAGNOSIS — R262 Difficulty in walking, not elsewhere classified: Secondary | ICD-10-CM | POA: Diagnosis not present

## 2020-02-01 DIAGNOSIS — M25622 Stiffness of left elbow, not elsewhere classified: Secondary | ICD-10-CM | POA: Diagnosis not present

## 2020-02-01 DIAGNOSIS — M25552 Pain in left hip: Secondary | ICD-10-CM

## 2020-02-01 DIAGNOSIS — M25551 Pain in right hip: Secondary | ICD-10-CM | POA: Diagnosis not present

## 2020-02-01 DIAGNOSIS — M6281 Muscle weakness (generalized): Secondary | ICD-10-CM

## 2020-02-01 NOTE — Therapy (Signed)
[facility] [address] [place], [address] Phone: [phone]   Fax:  [phone]  Physical Therapy Treatment  Patient Details  Name: Kendra [name] MRN: [id number] Date of Birth: 1946-06-26 No data recorded  Encounter Date: 02/01/2020  PT End of Session - 02/01/20 1725    Visit Number  29    Number of Visits  38    Date for PT Re-Evaluation  02/29/20    Authorization Type  1/10    PT Start Time  1515    PT Stop Time  1600    PT Time Calculation (min)  45 min    Equipment Utilized During Treatment  Gait belt    Activity Tolerance  Patient tolerated treatment well;No increased pain    Behavior During Therapy  Flat affect       Past Medical History:  Diagnosis Date  . Anxiety   . Breast cancer ([place]) 03/2018   right breast cancer at 11:00 and 1:00  . Bursitis    bilateral hips and knees  . Complication of anesthesia   . Depression   . Family history of breast cancer 03/24/2018  . Family history of lung cancer 03/24/2018  . History of alcohol dependence ([place]) 2007   no ETOH; resolved since 2007  . History of hiatal hernia   . History of psychosis 2014   due to sleep disturbance  . Hypertension   . Personal history of radiation therapy 06/2018-07/2018   right breast ca  . PONV (postoperative nausea and vomiting)     Past Surgical History:  Procedure Laterality Date  . BREAST BIOPSY Right 03/14/2018   11:00 DCIS and invasive ductal carcinoma  . BREAST BIOPSY Right 03/14/2018   1:00 Invasive ductal carcinoma  . BREAST LUMPECTOMY Right 04/27/2018   lumpectomy of 11 and 1:00 cancers, clear margins, negative LN  . BREAST LUMPECTOMY WITH RADIOACTIVE SEED AND SENTINEL LYMPH NODE BIOPSY Right 04/27/2018   Procedure: RIGHT BREAST LUMPECTOMY WITH RADIOACTIVE SEED X 2 AND RIGHT SENTINEL LYMPH NODE BIOPSY;  Surgeon: [name], MD;  Location: [facility];  Service: General;  Laterality:  Right;  . CATARACT EXTRACTION W/PHACO Left 08/30/2019   Procedure: CATARACT EXTRACTION PHACO AND INTRAOCULAR LENS PLACEMENT ([facility]) LEFT panoptix toric  01:20.5  12.7%  10.26;  Surgeon: Leandrew Koyanagi, MD;  Location: [facility];  Service: Ophthalmology;  Laterality: Left;  . CATARACT EXTRACTION W/PHACO Right 09/20/2019   Procedure: CATARACT EXTRACTION PHACO AND INTRAOCULAR LENS PLACEMENT ([facility]) RIGHT 5.71  00:36.4  15.7%;  Surgeon: Leandrew Koyanagi, MD;  Location: [facility];  Service: Ophthalmology;  Laterality: Right;  . LAPAROTOMY N/A 02/10/2018   Procedure: EXPLORATORY LAPAROTOMY;  Surgeon: Isabel Caprice, MD;  Location[facility];  Service: Gynecology;  Laterality: N/A;  . MASS EXCISION  01/2018   abdominal  . OVARIAN CYST REMOVAL    . SALPINGOOPHORECTOMY Bilateral 02/10/2018   Procedure: BILATERAL SALPINGO OOPHORECTOMY; PERITONEAL WASHINGS;  Surgeon: Isabel Caprice, MD;  [facility];  Service: Gynecology;  Laterality: Bilateral;  . TONSILLECTOMY    . TONSILLECTOMY AND ADENOIDECTOMY  1953    There were no vitals filed for this visit.  Subjective Assessment - 02/01/20 1531    Subjective  Patient states no major changes since the previous session. Patient states she has been walking and gets an increase in pain after the the second lap when walking.    Pertinent History  Patient report unclear regarding onset.  Visited MD on Sept 16 and received injection with relief for approximately 1 month. Reports she cannot receive an additional injection pending eye surgery. Received dx from MD of hip bursitis in B hips. Reports pain with walking, stanindg, sitting 5/10 current 7/10 worst 3/10 best, with temporary relief when she is sitting "just right" and with naproxen. Agg: activity. Cormorbidities: hx of breast cancer, HTN, MCI, insomnia, cataracts    Limitations  Sitting;Standing;Walking;House hold activities    How long can you sit comfortably?  Pain constant, reduced  with neutral pelvic tilt    How long can you stand comfortably?  Pain constant, increases with time    How long can you walk comfortably?  Pain constant, increases with time    Patient Stated Goals  Be able to move without hurting, walk 1 hour    Currently in Pain?  No/denies    Pain Onset  More than a month ago       TREATMENT Therapeutic Exercise Hamstring stretch in sitting -- 2 x 10 with plantarflexion/dorsiflexion Side stepping with GTB around knees -- 2 x 41ft B Backwards amb -- 4 x 28ft Exercises to improve strengthening limitations  Gait training Ambulation with focus on heel strike, lumbar extension, and stride length with supervision assist from therapist - decreased stride length continues with improve in other limitations after giving verbal cueing -- 244ft x 6   PT Education - 02/01/20 1724    Education provided  Yes    Education Details  form/techniqu with exercise; heel strike, lumbar extension with amb    Person(s) Educated  Patient    Methods  Explanation;Demonstration    Comprehension  Returned demonstration;Verbalized understanding       PT Short Term Goals - 09/12/19 1419      PT SHORT TERM GOAL #1   Title  Patient will demonstrate complaince with HEP to speed recovery and reduce total number of visits.    Baseline  HEP given    Time  2    Period  Weeks    Status  New    Target Date  09/27/19        PT Long Term Goals - 01/29/20 1015      PT LONG TERM GOAL #1   Title  Patient will improve hip strength to 4/5 grossly to facilitate improved mobility with ADLs.    Baseline  3, 4-; 12/22 deferred to NV; 11/09/2019: 4-/5; 12/07/2019 L hip flexor 4, abd and ext 3+; 01/29/2020: 4/5 for most hip motions, 3+/5 for abd, ext    Time  8    Period  Weeks    Status  On-going      PT LONG TERM GOAL #2   Title  Patient will demonstrate walking tolerance increased to 30 minutes without increase in symptoms to demonstrate improved ability to perform walking program  for cardiovascular health.    Baseline  10 minute tolerance (patient report); 12/22 10 minutes (per pt report); 11/09/2019: 7 min; 12/07/2019 unable to assess due to icy streets;  01/29/2020: 10 min    Time  8    Period  Weeks    Status  On-going      PT LONG TERM GOAL #3   Title  Patient will report worst pain of 3/10 during therapy to demonstrate reduced disability    Baseline  Worst 7/10; 12/22 worst 9/10; 11/09/2019: 9/10; 12/07/2019 7/10; 01/29/2020: 7/10    Time  8    Period  [place]  Status  On-going      PT LONG TERM GOAL #4   Title  Patient will demonstrate ability to perform all bed mobility maneuvers without increase in pain for reduced disability with mobility and transfers with ADLs.    Baseline  Reports pain with bed mobility supine <> sidelying L/R, sidelying to sitting EOB; 12/22 pt able to perform I without increased pain    Time  8    Period  Weeks    Status  Achieved      PT LONG TERM GOAL #5   Title  Patient will improve 10MW speed to 1.0 m/sec to meet norms for minimal fall risk and full community ambulation.    Baseline  0.88 m/sec; 12/22 0.96 m/sec; 11/09/2019: deferred; 12/07/2019 0.90 m/sec; 01/29/2020: .9  m/sec    Time  8    Period  Weeks    Status  On-going      Additional Long Term Goals   Additional Long Term Goals  Yes      PT LONG TERM GOAL #6   Title  Patient will increase 6MWT by 200 ft to demonstrate improvement in functional capacity for transition to cardiac walking program    Baseline  6MWT = 1195; 1/7/20201: deferred; 12/07/2019 1095; 01/29/2020: deferred secondary to fatigue    Time  8    Period  Weeks    Status  Deferred      PT LONG TERM GOAL #7   Title  Patient will improve her 5xsts to under 15 sec to indicate singificnat improvement in LE functional strength and decrease in fall risk.    Baseline  24.5 sec    Time  6    Period  Weeks    Status  New            Plan - 02/01/20 1726    Clinical Impression Statement  Addressed limitations in  walking for prolonged period of time during today's sessions, patient demonstrates increassed forward lean, decreased heel strike, and + trendelenberg on the affected side limiting ability to walk for proonged periods of time before onset of fatigue. attempted to address these limitations, with limited success. Patient able to walk for longer periods of time wihtout an increase in pain. However, demonstrates limitations along the L hamstring limtiing performance. Educated patient to address these limitatoins with HEP. Patient will benefit from furhter skilled therapy focused on imrpoving these limitations to return to priro level of function.    Personal Factors and Comorbidities  Age;Comorbidity 3+;Time since onset of injury/illness/exacerbation;Comorbidity 2    Comorbidities  MCI, HTN, CA, cataracts    Examination-Activity Limitations  Bend;Sit;Squat;Stairs;Stand;Sleep    Examination-Participation Restrictions  Cleaning;Community Activity    Stability/Clinical Decision Making  Evolving/Moderate complexity    Rehab Potential  Fair    Clinical Impairments Affecting Rehab Potential  MCI    PT Frequency  2x / week   Eval and 1 f/u visit   PT Duration  6 weeks    PT Treatment/Interventions  ADLs/Self Care Home Management;Therapeutic exercise;Patient/family education;Neuromuscular re-education;Therapeutic activities;Functional mobility training;Balance training;Manual techniques;Gait training;Stair training;Moist Heat;Electrical Stimulation;Cryotherapy;Taping;Dry needling;Energy conservation;Passive range of motion;Joint Manipulations    PT Next Visit Plan  Progress glute therex, pelvic-hip dissociation; check and progress HEP with stretches    PT Home Exercise Plan  lateral step ups, step ups, hip kickers ext/abd red theraband    Consulted and Agree with Plan of Care  Patient       Patient will benefit from skilled therapeutic intervention  in order to improve the following deficits and impairments:   Decreased range of motion, Postural dysfunction, Pain, Abnormal gait, Decreased activity tolerance, Decreased coordination, Decreased endurance, Decreased balance, Decreased mobility, Difficulty walking, Hypomobility, Increased muscle spasms, Impaired perceived functional ability, Decreased strength, Impaired flexibility  Visit Diagnosis: Muscle weakness (generalized)  Difficulty in walking, not elsewhere classified  Pain in left hip     Problem List Patient Active Problem List   Diagnosis Date Noted  . 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 Bipolar disorder, in full remission, most recent episode depressed ([place]) 11/08/2019  . Avitaminosis D 08/24/2019  . Insomnia due to mental condition 05/31/2019  . Bipolar I disorder, most recent episode depressed ([place]) 05/31/2019  . Mild cognitive disorder 05/31/2019  . Alcohol use disorder, moderate, in sustained remission ([place]) 05/31/2019  . Elevated tumor markers 05/27/2019  . GAD (generalized anxiety disorder) 04/10/2019  . Altered mental status 04/10/2019  . MDD (major depressive disorder), severe ([place]) 01/31/2019  . High serum carbohydrate antigen 19-9 (CA19-9) 12/01/2018  . Adjustment disorder with anxious mood 11/24/2018  . Essential hypertension 11/24/2018  . Screening for lipid disorders 11/24/2018  . B12 deficiency 10/17/2018  . Incisional hernia, without obstruction or gangrene 09/12/2018  . Genetic testing 04/07/2018  . Family history of breast cancer 03/24/2018  . Family history of lung cancer 03/24/2018  . Malignant neoplasm of upper-outer quadrant of right breast in female, estrogen receptor positive ([place]) 03/17/2018  . Malignant neoplasm of upper-inner quadrant of right breast in female, estrogen receptor positive ([place]) 03/17/2018  . History of alcohol dependence ([place]) 02/07/2018  . History of psychosis 02/07/2018  . History of insomnia 02/07/2018    Blythe Stanford, PT DPT 02/01/2020, 5:30 PM  [facility] [address], [place], [address] Phone: [phone]   Fax:  [phone]  Name: Kellea Potthoff MRN: [id number] Date of Birth: 06-30-1946

## 2020-02-06 ENCOUNTER — Ambulatory Visit: Payer: Medicare PPO

## 2020-02-07 ENCOUNTER — Ambulatory Visit: Payer: Medicare PPO | Admitting: Occupational Therapy

## 2020-02-08 ENCOUNTER — Ambulatory Visit: Payer: Medicare PPO

## 2020-02-08 ENCOUNTER — Other Ambulatory Visit: Payer: Self-pay

## 2020-02-08 DIAGNOSIS — R262 Difficulty in walking, not elsewhere classified: Secondary | ICD-10-CM | POA: Diagnosis not present

## 2020-02-08 DIAGNOSIS — M25622 Stiffness of left elbow, not elsewhere classified: Secondary | ICD-10-CM | POA: Diagnosis not present

## 2020-02-08 DIAGNOSIS — M25552 Pain in left hip: Secondary | ICD-10-CM | POA: Diagnosis not present

## 2020-02-08 DIAGNOSIS — M25522 Pain in left elbow: Secondary | ICD-10-CM | POA: Diagnosis not present

## 2020-02-08 DIAGNOSIS — M6281 Muscle weakness (generalized): Secondary | ICD-10-CM | POA: Diagnosis not present

## 2020-02-08 DIAGNOSIS — M25551 Pain in right hip: Secondary | ICD-10-CM | POA: Diagnosis not present

## 2020-02-08 NOTE — Therapy (Signed)
[facility] [address] [place], [address] Phone: [phone]   Fax:  [phone]  Physical Therapy Treatment  Patient Details  Name: Kendra [name] MRN: [id number] Date of Birth: February 22, 1946 No data recorded  Encounter Date: 02/08/2020  PT End of Session - 02/08/20 0838    Visit Number  30    Number of Visits  38    Date for PT Re-Evaluation  02/29/20    Authorization Type  2/10    PT Start Time  0815    PT Stop Time  0900    PT Time Calculation (min)  45 min    Equipment Utilized During Treatment  Gait belt    Activity Tolerance  Patient tolerated treatment well;No increased pain    Behavior During Therapy  Flat affect       Past Medical History:  Diagnosis Date  . Anxiety   . Breast cancer ([facility]) 03/2018   right breast cancer at 11:00 and 1:00  . Bursitis    bilateral hips and knees  . Complication of anesthesia   . Depression   . Family history of breast cancer 03/24/2018  . Family history of lung cancer 03/24/2018  . History of alcohol dependence ([place]) 2007   no ETOH; resolved since 2007  . History of hiatal hernia   . History of psychosis 2014   due to sleep disturbance  . Hypertension   . Personal history of radiation therapy 06/2018-07/2018   right breast ca  . PONV (postoperative nausea and vomiting)     Past Surgical History:  Procedure Laterality Date  . BREAST BIOPSY Right 03/14/2018   11:00 DCIS and invasive ductal carcinoma  . BREAST BIOPSY Right 03/14/2018   1:00 Invasive ductal carcinoma  . BREAST LUMPECTOMY Right 04/27/2018   lumpectomy of 11 and 1:00 cancers, clear margins, negative LN  . BREAST LUMPECTOMY WITH RADIOACTIVE SEED AND SENTINEL LYMPH NODE BIOPSY Right 04/27/2018   Procedure: RIGHT BREAST LUMPECTOMY WITH RADIOACTIVE SEED X 2 AND RIGHT SENTINEL LYMPH NODE BIOPSY;  Surgeon: [name], MD;  Location: [facility];  Service: General;  Laterality:  Right;  . CATARACT EXTRACTION W/PHACO Left 08/30/2019   Procedure: CATARACT EXTRACTION PHACO AND INTRAOCULAR LENS PLACEMENT ([facility]) LEFT panoptix toric  01:20.5  12.7%  10.26;  Surgeon: Leandrew Koyanagi, MD;  Location: [facility];  Service: Ophthalmology;  Laterality: Left;  . CATARACT EXTRACTION W/PHACO Right 09/20/2019   Procedure: CATARACT EXTRACTION PHACO AND INTRAOCULAR LENS PLACEMENT ([facility]) RIGHT 5.71  00:36.4  15.7%;  Surgeon: Leandrew Koyanagi, MD;  Location: [facility];  Service: Ophthalmology;  Laterality: Right;  . LAPAROTOMY N/A 02/10/2018   Procedure: EXPLORATORY LAPAROTOMY;  Surgeon: Isabel Caprice, MD;  [facility];  Service: Gynecology;  Laterality: N/A;  . MASS EXCISION  01/2018   abdominal  . OVARIAN CYST REMOVAL    . SALPINGOOPHORECTOMY Bilateral 02/10/2018   Procedure: BILATERAL SALPINGO OOPHORECTOMY; PERITONEAL WASHINGS;  Surgeon: Isabel Caprice, MD;  [facility];  Service: Gynecology;  Laterality: Bilateral;  . TONSILLECTOMY    . TONSILLECTOMY AND ADENOIDECTOMY  1953    There were no vitals filed for this visit.  Subjective Assessment - 02/08/20 0821    Subjective  Patient reports she has increased pain with transferring from sitting to standing and transfering from supine and sitting. Patient states limited to no improvement so far.    Pertinent History  Patient report unclear regarding onset. Visited MD  on Sept 16 and received injection with relief for approximately 1 month. Reports she cannot receive an additional injection pending eye surgery. Received dx from MD of hip bursitis in B hips. Reports pain with walking, stanindg, sitting 5/10 current 7/10 worst 3/10 best, with temporary relief when she is sitting "just right" and with naproxen. Agg: activity. Cormorbidities: hx of breast cancer, HTN, MCI, insomnia, cataracts    Limitations  Sitting;Standing;Walking;House hold activities    How long can you sit comfortably?  Pain constant,  reduced with neutral pelvic tilt    How long can you stand comfortably?  Pain constant, increases with time    How long can you walk comfortably?  Pain constant, increases with time    Patient Stated Goals  Be able to move without hurting, walk 1 hour    Currently in Pain?  No/denies    Pain Onset  More than a month ago     TREATMENT Therapeutic Exercise Hip extension in standing -  2x 10  Hip extension in sidelying - 2x 10  Side stepping with RTB around knees -- 4 x 43ft B Sit to stands with extension at top portion of standing - x 20 Exercises to improve strengthening limitations   Gait training Ambulation with focus on heel strike, lumbar extension, and stride length with supervision assist from therapist - decreased stride length continues with improve in other limitations after giving verbal cueing - 425ft      PT Education - 02/08/20 0837    Education provided  Yes    Education Details  form/technique with exercise    Person(s) Educated  Patient    Methods  Explanation;Demonstration    Comprehension  Verbalized understanding;Returned demonstration       PT Short Term Goals - 09/12/19 1419      PT SHORT TERM GOAL #1   Title  Patient will demonstrate complaince with HEP to speed recovery and reduce total number of visits.    Baseline  HEP given    Time  2    Period  Weeks    Status  New    Target Date  09/27/19        PT Long Term Goals - 01/29/20 1015      PT LONG TERM GOAL #1   Title  Patient will improve hip strength to 4/5 grossly to facilitate improved mobility with ADLs.    Baseline  3, 4-; 12/22 deferred to NV; 11/09/2019: 4-/5; 12/07/2019 L hip flexor 4, abd and ext 3+; 01/29/2020: 4/5 for most hip motions, 3+/5 for abd, ext    Time  8    Period  Weeks    Status  On-going      PT LONG TERM GOAL #2   Title  Patient will demonstrate walking tolerance increased to 30 minutes without increase in symptoms to demonstrate improved ability to perform walking  program for cardiovascular health.    Baseline  10 minute tolerance (patient report); 12/22 10 minutes (per pt report); 11/09/2019: 7 min; 12/07/2019 unable to assess due to icy streets;  01/29/2020: 10 min    Time  8    Period  Weeks    Status  On-going      PT LONG TERM GOAL #3   Title  Patient will report worst pain of 3/10 during therapy to demonstrate reduced disability    Baseline  Worst 7/10; 12/22 worst 9/10; 11/09/2019: 9/10; 12/07/2019 7/10; 01/29/2020: 7/10    Time  8  Period  Weeks    Status  On-going      PT LONG TERM GOAL #4   Title  Patient will demonstrate ability to perform all bed mobility maneuvers without increase in pain for reduced disability with mobility and transfers with ADLs.    Baseline  Reports pain with bed mobility supine <> sidelying L/R, sidelying to sitting EOB; 12/22 pt able to perform I without increased pain    Time  8    Period  Weeks    Status  Achieved      PT LONG TERM GOAL #5   Title  Patient will improve 10MW speed to 1.0 m/sec to meet norms for minimal fall risk and full community ambulation.    Baseline  0.88 m/sec; 12/22 0.96 m/sec; 11/09/2019: deferred; 12/07/2019 0.90 m/sec; 01/29/2020: .9  m/sec    Time  8    Period  Weeks    Status  On-going      Additional Long Term Goals   Additional Long Term Goals  Yes      PT LONG TERM GOAL #6   Title  Patient will increase 6MWT by 200 ft to demonstrate improvement in functional capacity for transition to cardiac walking program    Baseline  6MWT = 1195; 1/7/20201: deferred; 12/07/2019 1095; 01/29/2020: deferred secondary to fatigue    Time  8    Period  Weeks    Status  Deferred      PT LONG TERM GOAL #7   Title  Patient will improve her 5xsts to under 15 sec to indicate singificnat improvement in LE functional strength and decrease in fall risk.    Baseline  24.5 sec    Time  6    Period  Weeks    Status  New            Plan - 02/08/20 [id number]    Clinical Impression Statement  Patient  demonstrates no increase in pain during today's treatment session. Patient demonstrates poor hip extension with exercise, with inability to perform in standing. This is most likley partly from poor propioception and poor hip extenion AROM/strength. Will continue to focus on improving hip extension and lumbar extension in standing. Patient will benefit from further skilled therapy focused on improving limitations to return to prior level of function.    Personal Factors and Comorbidities  Age;Comorbidity 3+;Time since onset of injury/illness/exacerbation;Comorbidity 2    Comorbidities  MCI, HTN, CA, cataracts    Examination-Activity Limitations  Bend;Sit;Squat;Stairs;Stand;Sleep    Examination-Participation Restrictions  Cleaning;Community Activity    Stability/Clinical Decision Making  Evolving/Moderate complexity    Rehab Potential  Fair    Clinical Impairments Affecting Rehab Potential  MCI    PT Frequency  2x / week   Eval and 1 f/u visit   PT Duration  6 weeks    PT Treatment/Interventions  ADLs/Self Care Home Management;Therapeutic exercise;Patient/family education;Neuromuscular re-education;Therapeutic activities;Functional mobility training;Balance training;Manual techniques;Gait training;Stair training;Moist Heat;Electrical Stimulation;Cryotherapy;Taping;Dry needling;Energy conservation;Passive range of motion;Joint Manipulations    PT Next Visit Plan  Progress glute therex, pelvic-hip dissociation; check and progress HEP with stretches    PT Home Exercise Plan  lateral step ups, step ups, hip kickers ext/abd red theraband    Consulted and Agree with Plan of Care  Patient       Patient will benefit from skilled therapeutic intervention in order to improve the following deficits and impairments:  Decreased range of motion, Postural dysfunction, Pain, Abnormal gait, Decreased activity tolerance, Decreased coordination, Decreased  endurance, Decreased balance, Decreased mobility, Difficulty  walking, Hypomobility, Increased muscle spasms, Impaired perceived functional ability, Decreased strength, Impaired flexibility  Visit Diagnosis: Muscle weakness (generalized)  Difficulty in walking, not elsewhere classified     Problem List Patient Active Problem List   Diagnosis Date Noted  . 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 Bipolar disorder, in full remission, most recent episode depressed ([place]) 11/08/2019  . Avitaminosis D 08/24/2019  . Insomnia due to mental condition 05/31/2019  . Bipolar I disorder, most recent episode depressed ([place]) 05/31/2019  . Mild cognitive disorder 05/31/2019  . Alcohol use disorder, moderate, in sustained remission ([place]) 05/31/2019  . Elevated tumor markers 05/27/2019  . GAD (generalized anxiety disorder) 04/10/2019  . Altered mental status 04/10/2019  . MDD (major depressive disorder), severe ([place]) 01/31/2019  . High serum carbohydrate antigen 19-9 (CA19-9) 12/01/2018  . Adjustment disorder with anxious mood 11/24/2018  . Essential hypertension 11/24/2018  . Screening for lipid disorders 11/24/2018  . B12 deficiency 10/17/2018  . Incisional hernia, without obstruction or gangrene 09/12/2018  . Genetic testing 04/07/2018  . Family history of breast cancer 03/24/2018  . Family history of lung cancer 03/24/2018  . Malignant neoplasm of upper-outer quadrant of right breast in female, estrogen receptor positive ([facility]) 03/17/2018  . Malignant neoplasm of upper-inner quadrant of right breast in female, estrogen receptor positive ([place]) 03/17/2018  . History of alcohol dependence ([place]) 02/07/2018  . History of psychosis 02/07/2018  . History of insomnia 02/07/2018    Blythe Stanford, PT DPT 02/08/2020, 9:18 AM  [facility] [address] [place], [address] Phone: [phone]   Fax:  [phone]  Name: Kendra [name] MRN: [id number] Date of Birth: [birth year]

## 2020-02-12 DIAGNOSIS — R4189 Other symptoms and signs involving cognitive functions and awareness: Secondary | ICD-10-CM | POA: Diagnosis not present

## 2020-02-13 ENCOUNTER — Ambulatory Visit: Payer: Medicare PPO | Admitting: Occupational Therapy

## 2020-02-13 ENCOUNTER — Encounter: Payer: Self-pay | Admitting: Licensed Clinical Social Worker

## 2020-02-13 ENCOUNTER — Ambulatory Visit (INDEPENDENT_AMBULATORY_CARE_PROVIDER_SITE_OTHER): Payer: Medicare PPO | Admitting: Licensed Clinical Social Worker

## 2020-02-13 ENCOUNTER — Ambulatory Visit: Payer: Medicare PPO

## 2020-02-13 ENCOUNTER — Other Ambulatory Visit: Payer: Self-pay

## 2020-02-13 DIAGNOSIS — M25622 Stiffness of left elbow, not elsewhere classified: Secondary | ICD-10-CM

## 2020-02-13 DIAGNOSIS — M6281 Muscle weakness (generalized): Secondary | ICD-10-CM | POA: Diagnosis not present

## 2020-02-13 DIAGNOSIS — M25551 Pain in right hip: Secondary | ICD-10-CM | POA: Diagnosis not present

## 2020-02-13 DIAGNOSIS — F3176 Bipolar disorder, in full remission, most recent episode depressed: Secondary | ICD-10-CM

## 2020-02-13 DIAGNOSIS — M25522 Pain in left elbow: Secondary | ICD-10-CM | POA: Diagnosis not present

## 2020-02-13 DIAGNOSIS — F411 Generalized anxiety disorder: Secondary | ICD-10-CM

## 2020-02-13 DIAGNOSIS — R262 Difficulty in walking, not elsewhere classified: Secondary | ICD-10-CM

## 2020-02-13 DIAGNOSIS — M25552 Pain in left hip: Secondary | ICD-10-CM | POA: Diagnosis not present

## 2020-02-13 NOTE — Progress Notes (Signed)
Virtual Visit via Video Note  I connected with Kendra [name] on 02/13/20 at  9:00 AM EDT by a video enabled telemedicine application and verified that I am speaking with the correct person using two identifiers.   I discussed the limitations of evaluation and management by telemedicine and the availability of in person appointments. The patient expressed understanding and agreed to proceed.  I discussed the assessment and treatment plan with the patient. The patient was provided an opportunity to ask questions and all were answered. The patient agreed with the plan and demonstrated an understanding of the instructions.   The patient was advised to call back or seek an in-person evaluation if the symptoms worsen or if the condition fails to improve as anticipated.  I provided 45 minutes of non-face-to-face time during this encounter.   Alden Hipp, LCSW    THERAPIST PROGRESS NOTE  Session Time: 0900  Participation Level: Active  Behavioral Response: CasualAlertAnxious  Type of Therapy: Individual Therapy  Treatment Goals addressed: Coping  Interventions: Supportive  Summary: Kendra [name] is a 74 y.o. female who presents with continued symptoms related to her diagnosis. Kendra [name] reports doing well since our last session, but noted she got some upsetting news yesterday. Kendra [name] reports she was diagnosed with Parkinson's Disease, and is still feeling very depressed from that information. LCSW validated Kendra [name]'s feelings and held space for her to discuss her thoughts/feelings around the situation. LCSW encouraged Kendra [name] to get a book, written by someone who has experienced this process, and try to gather information. We also discussed writing down all of her questions so she can ensure she gets them answered, either from research or from conversations with her doctor. We also discussed ways to challenge anxious/negative thoughts as they come up around this new diagnosis. Kendra [name] expressed  understanding and agreement with all information presented.  Suicidal/Homicidal: No  Therapist Response: Kendra [name] continues to work towards her tx goals but has not yet reached them. We will continue to work on improving emotional regulation and distress tolerance skills moving forward.   Plan: Return again in 2 weeks.  Diagnosis: Axis I: Bipolar Disorder    Axis II: No diagnosis    Alden Hipp, LCSW 02/13/2020

## 2020-02-13 NOTE — Patient Instructions (Signed)
Same but do elbow extention flexion against wall - focus on end range against wall And make sure goes into putty with digits but pain free

## 2020-02-13 NOTE — Therapy (Signed)
[facility] [address] [place], [address] Phone: [phone]   Fax:  [phone]  Occupational Therapy Treatment  Patient Details  Name: Kendra [name] MRN: [id number] Date of Birth: 1946-08-19 Referring Provider (OT): Vance Peper Date: 02/13/2020  OT End of Session - 02/13/20 1619    Visit Number  5    Number of Visits  8    Date for OT Re-Evaluation  02/14/20    OT Start Time  [id number]    OT Stop Time  1614    OT Time Calculation (min)  59 min    Activity Tolerance  Patient tolerated treatment well    Behavior During Therapy  Flat affect       Past Medical History:  Diagnosis Date  . Anxiety   . Breast cancer ([place]) 03/2018   right breast cancer at 11:00 and 1:00  . Bursitis    bilateral hips and knees  . Complication of anesthesia   . Depression   . Family history of breast cancer 03/24/2018  . Family history of lung cancer 03/24/2018  . History of alcohol dependence ([place]) 2007   no ETOH; resolved since 2007  . History of hiatal hernia   . History of psychosis 2014   due to sleep disturbance  . Hypertension   . Personal history of radiation therapy 06/2018-07/2018   right breast ca  . PONV (postoperative nausea and vomiting)     Past Surgical History:  Procedure Laterality Date  . BREAST BIOPSY Right 03/14/2018   11:00 DCIS and invasive ductal carcinoma  . BREAST BIOPSY Right 03/14/2018   1:00 Invasive ductal carcinoma  . BREAST LUMPECTOMY Right 04/27/2018   lumpectomy of 11 and 1:00 cancers, clear margins, negative LN  . BREAST LUMPECTOMY WITH RADIOACTIVE SEED AND SENTINEL LYMPH NODE BIOPSY Right 04/27/2018   Procedure: RIGHT BREAST LUMPECTOMY WITH RADIOACTIVE SEED X 2 AND RIGHT SENTINEL LYMPH NODE BIOPSY;  Surgeon: [name], MD;  Location: [facility];  Service: General;  Laterality: Right;  . CATARACT EXTRACTION W/PHACO Left 08/30/2019   Procedure:  CATARACT EXTRACTION PHACO AND INTRAOCULAR LENS PLACEMENT ([place]) LEFT panoptix toric  01:20.5  12.7%  10.26;  Surgeon: Leandrew Koyanagi, MD;  Location: [facility];  Service: Ophthalmology;  Laterality: Left;  . CATARACT EXTRACTION W/PHACO Right 09/20/2019   Procedure: CATARACT EXTRACTION PHACO AND INTRAOCULAR LENS PLACEMENT ([facility]) RIGHT 5.71  00:36.4  15.7%;  Surgeon: Leandrew Koyanagi, MD;  Location: [facility];  Service: Ophthalmology;  Laterality: Right;  . LAPAROTOMY N/A 02/10/2018   Procedure: EXPLORATORY LAPAROTOMY;  Surgeon: Isabel Caprice, MD;  [facility];  Service: Gynecology;  Laterality: N/A;  . MASS EXCISION  01/2018   abdominal  . OVARIAN CYST REMOVAL    . SALPINGOOPHORECTOMY Bilateral 02/10/2018   Procedure: BILATERAL SALPINGO OOPHORECTOMY; PERITONEAL WASHINGS;  Surgeon: Isabel Caprice, MD;  [facility];  Service: Gynecology;  Laterality: Bilateral;  . TONSILLECTOMY    . TONSILLECTOMY AND ADENOIDECTOMY  1953    There were no vitals filed for this visit.  Subjective Assessment - 02/13/20 1616    Subjective   So I was diagnosis with Parkinsons Monday - started some medication but it works for about 3 hrs - then it is out of your system - seen the ortho doctor - fracture is healed and can cont with strengthening    Pertinent History  Pt fell 11/23/2019 and fracture her R elbow -  minimal displaced radius head fx - pt in sling if she is out and about - but refer to OT for ROM ,PROM and weight limited 1-2 lbs -    Patient Stated Goals  I would like the ROM , strength better so I can push up from chair and have no pain    Currently in Pain?  No/denies       Grip strentgth same as last time -and extention at elbow still decrease -5 to -8 degrees  Pt diagnosis with Parkinson's 2 days ago - pt talked with OT about it and treatment plan from neurologist  Did discuss with pt LSVT options that is there LSVT BIG and LOUD - that is done by neuro OT/PT and  SLP   Tried pt on hand gripper with 10 lbs resistance but pt had thumb pain - CMC OA ?  Pt to cont with putty -same resistance but focus to get digits into putty               OT Treatments/Exercises (OP) - 02/13/20 0001      Moist Heat Therapy   Number Minutes Moist Heat  8 Minutes    Moist Heat Location  Elbow   used heatingpad for extention stretch     pt had full extention after heating pad   2 lbs20-30repselbow flexion and extention against wall change to and to focus on end range elbow extention - to do palm up and thumb up -  2x day    Cont with same 1/2 firm green to her  teal putty to HEP for grip   Can do 3 sets  12-15 reps  Pain free -but get fingers into putty   Wall pushup doing well with per pt - to cont 3 sets of 10 pain free   Done this date Biodex for elbow extention bar- 2 x 15 reps increase to 10 lbs  And 2x 15 reps rows 15 lbs  And  2 x 15 reps chest press - 10 lbs  armbike 6  min - changing direction - pain free -       OT Education - 02/13/20 1618    Education Details  HEP changes    Person(s) Educated  Patient    Methods  Explanation;Demonstration;Tactile cues;Verbal cues;Handout    Comprehension  Verbal cues required;Returned demonstration;Verbalized understanding       OT Short Term Goals - 12/20/19 2028      OT SHORT TERM GOAL #1   Title  Pt to be independent in HEP to increase L elbow extention to -5 and increase strenght to carry more than 5 lbs    Baseline  no knowledge of HEP    Time  4    Period  Weeks    Status  New    Target Date  01/17/20        OT Long Term Goals - 12/20/19 2029      OT LONG TERM GOAL #1   Title  L Elbow extention improve for pt to pull shirt over head, push up and carry more than 8 lbs without increase symptoms    Baseline  -22 extention of L elbow and weight limit 2 lbs - pushing up by accident pain 5/10    Time  8    Period  Weeks    Status  New    Target Date  02/14/20      OT  LONG TERM GOAL #2   Title  Pt AROM and strength in L elbow improve to prior level for function    Baseline  -22 extention , weight 2 lbs limited - cannot do makidng bed, flowers, dry back , carry more than 2 lbs , no pushing up    Time  8    Period  Weeks    Status  New    Target Date  02/14/20            Plan - 02/13/20 1619    Clinical Impression Statement  Pt is about 11 wks out from minimal fx of radius fx - she was diagnosis with Parkinson's beginning of the week - can explain some of her behaviour, tremor, rigid and stiffness in motion - pt grip still the same , extention still limited by -5 to -8 degrees but was able to increase weight on Biodex with 5 lbs with no pain    OT Occupational Profile and History  Problem Focused Assessment - Including review of records relating to presenting problem    Occupational performance deficits (Please refer to evaluation for details):  ADL's;IADL's;Play;Leisure;Social Participation    Body Structure / Function / Physical Skills  ADL;Flexibility;ROM;UE functional use;Pain;Strength;IADL    Rehab Potential  Good    Clinical Decision Making  Limited treatment options, no task modification necessary    Comorbidities Affecting Occupational Performance:  May have comorbidities impacting occupational performance    Modification or Assistance to Complete Evaluation   No modification of tasks or assist necessary to complete eval    OT Frequency  Biweekly    OT Duration  2 weeks    OT Treatment/Interventions  Self-care/ADL training;Therapeutic exercise;Patient/family education;Cryotherapy;Paraffin;Moist Heat;Manual Therapy;Passive range of motion;Therapeutic activities    Plan  assess progress with HEP and upgrade to rubber band    OT Home Exercise Plan  see pt instruction    Consulted and Agree with Plan of Care  Patient       Patient will benefit from skilled therapeutic intervention in order to improve the following deficits and impairments:    Body Structure / Function / Physical Skills: ADL, Flexibility, ROM, UE functional use, Pain, Strength, IADL       Visit Diagnosis: Stiffness of left elbow, not elsewhere classified  Pain in left elbow    Problem List Patient Active Problem List   Diagnosis Date Noted  . 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 Bipolar disorder, in full remission, most recent episode depressed ([place]) 11/08/2019  . Avitaminosis D 08/24/2019  . Insomnia due to mental condition 05/31/2019  . Bipolar I disorder, most recent episode depressed ([place]) 05/31/2019  . Mild cognitive disorder 05/31/2019  . Alcohol use disorder, moderate, in sustained remission ([place]) 05/31/2019  . Elevated tumor markers 05/27/2019  . GAD (generalized anxiety disorder) 04/10/2019  . Altered mental status 04/10/2019  . MDD (major depressive disorder), severe ([place]) 01/31/2019  . High serum carbohydrate antigen 19-9 (CA19-9) 12/01/2018  . Adjustment disorder with anxious mood 11/24/2018  . Essential hypertension 11/24/2018  . Screening for lipid disorders 11/24/2018  . B12 deficiency 10/17/2018  . Incisional hernia, without obstruction or gangrene 09/12/2018  . Genetic testing 04/07/2018  . Family history of breast cancer 03/24/2018  . Family history of lung cancer 03/24/2018  . Malignant neoplasm of upper-outer quadrant of right breast in female, estrogen receptor positive ([place]) 03/17/2018  . Malignant neoplasm of upper-inner quadrant of right breast in female, estrogen receptor positive ([place]) 03/17/2018  . History of alcohol dependence ([place]) 02/07/2018  . History of  psychosis 02/07/2018  . History of insomnia 02/07/2018    Rosalyn Gess  OTR/L,CLT 02/13/2020, 4:22 PM  [facility] [address], [place], [address] Phone: [phone]   Fax:  [phone]  Name: Kendra [name] MRN: [id number] Date of Birth: 07/26/1946

## 2020-02-14 NOTE — Therapy (Signed)
[facility] [address] [place], [address] Phone: [phone]   Fax:  [phone]  Physical Therapy Treatment  Patient Details  Name: Kendra [name] MRN: [id number] Date of Birth: 06/01/1946 No data recorded  Encounter Date: 02/13/2020  PT End of Session - 02/14/20 1259    Visit Number  31    Number of Visits  38    Date for PT Re-Evaluation  02/29/20    Authorization Type  3/10    PT Start Time  1615    PT Stop Time  1700    PT Time Calculation (min)  45 min    Equipment Utilized During Treatment  Gait belt    Activity Tolerance  Patient tolerated treatment well;No increased pain    Behavior During Therapy  Flat affect       Past Medical History:  Diagnosis Date  . Anxiety   . Breast cancer ([place]) 03/2018   right breast cancer at 11:00 and 1:00  . Bursitis    bilateral hips and knees  . Complication of anesthesia   . Depression   . Family history of breast cancer 03/24/2018  . Family history of lung cancer 03/24/2018  . History of alcohol dependence ([place]) 2007   no ETOH; resolved since 2007  . History of hiatal hernia   . History of psychosis 2014   due to sleep disturbance  . Hypertension   . Personal history of radiation therapy 06/2018-07/2018   right breast ca  . PONV (postoperative nausea and vomiting)     Past Surgical History:  Procedure Laterality Date  . BREAST BIOPSY Right 03/14/2018   11:00 DCIS and invasive ductal carcinoma  . BREAST BIOPSY Right 03/14/2018   1:00 Invasive ductal carcinoma  . BREAST LUMPECTOMY Right 04/27/2018   lumpectomy of 11 and 1:00 cancers, clear margins, negative LN  . BREAST LUMPECTOMY WITH RADIOACTIVE SEED AND SENTINEL LYMPH NODE BIOPSY Right 04/27/2018   Procedure: RIGHT BREAST LUMPECTOMY WITH RADIOACTIVE SEED X 2 AND RIGHT SENTINEL LYMPH NODE BIOPSY;  Surgeon: [name], MD;  Location: [facility];  Service: General;   Laterality: Right;  . CATARACT EXTRACTION W/PHACO Left 08/30/2019   Procedure: CATARACT EXTRACTION PHACO AND INTRAOCULAR LENS PLACEMENT ([place]) LEFT panoptix toric  01:20.5  12.7%  10.26;  Surgeon: Leandrew Koyanagi, MD;  Location: [facility];  Service: Ophthalmology;  Laterality: Left;  . CATARACT EXTRACTION W/PHACO Right 09/20/2019   Procedure: CATARACT EXTRACTION PHACO AND INTRAOCULAR LENS PLACEMENT ([facility]) RIGHT 5.71  00:36.4  15.7%;  Surgeon: Leandrew Koyanagi, MD;  Location: [facility];  Service: Ophthalmology;  Laterality: Right;  . LAPAROTOMY N/A 02/10/2018   Procedure: EXPLORATORY LAPAROTOMY;  Surgeon: Isabel Caprice, MD;  [facility];  Service: Gynecology;  Laterality: N/A;  . MASS EXCISION  01/2018   abdominal  . OVARIAN CYST REMOVAL    . SALPINGOOPHORECTOMY Bilateral 02/10/2018   Procedure: BILATERAL SALPINGO OOPHORECTOMY; PERITONEAL WASHINGS;  Surgeon: Isabel Caprice, MD;  [facility];  Service: Gynecology;  Laterality: Bilateral;  . TONSILLECTOMY    . TONSILLECTOMY AND ADENOIDECTOMY  1953    There were no vitals filed for this visit.  Subjective Assessment - 02/13/20 1631    Subjective  Patient reports she continues to have pain with prolonged walking. Patient states she was diagnosed with Parkinson's a few days back.    Pertinent History  Patient report unclear regarding onset. Visited MD on Sept 16 and received  injection with relief for approximately 1 month. Reports she cannot receive an additional injection pending eye surgery. Received dx from MD of hip bursitis in B hips. Reports pain with walking, stanindg, sitting 5/10 current 7/10 worst 3/10 best, with temporary relief when she is sitting "just right" and with naproxen. Agg: activity. Cormorbidities: hx of breast cancer, HTN, MCI, insomnia, cataracts    Limitations  Sitting;Standing;Walking;House hold activities    How long can you sit comfortably?  Pain constant, reduced with neutral  pelvic tilt    How long can you stand comfortably?  Pain constant, increases with time    How long can you walk comfortably?  Pain constant, increases with time    Patient Stated Goals  Be able to move without hurting, walk 1 hour    Currently in Pain?  No/denies    Pain Onset  More than a month ago         -- TREATMENT Therapeutic Exercise Hip extension in standing -  2x 10  Hip extension in sidelying - 2x 10  Hip ER in sidelying - 2 x 10  Hip Extension in standing with furniture slider  - x 15 B Hip abduction in standing with furniture slider - x 15 B Side stepping with RTB around knees -- 4 x 35ft B Sit to stands with extension at top portion of standing - x 5 with RTB around knees Exercises to improve strengthening limitations   Gait training Ambulation with focus on heel strike, lumbar extension, and stride length with supervision assist from therapist - decreased stride length continues with improve in other limitations after giving verbal cueing - 474ft  PT Education - 02/13/20 1654    Education provided  Yes    Education Details  form/technique with exercise; RTB for sit to stands at home    Person(s) Educated  Patient    Methods  Explanation;Demonstration    Comprehension  Verbalized understanding;Returned demonstration       PT Short Term Goals - 09/12/19 1419      PT SHORT TERM GOAL #1   Title  Patient will demonstrate complaince with HEP to speed recovery and reduce total number of visits.    Baseline  HEP given    Time  2    Period  Weeks    Status  New    Target Date  09/27/19        PT Long Term Goals - 01/29/20 1015      PT LONG TERM GOAL #1   Title  Patient will improve hip strength to 4/5 grossly to facilitate improved mobility with ADLs.    Baseline  3, 4-; 12/22 deferred to NV; 11/09/2019: 4-/5; 12/07/2019 L hip flexor 4, abd and ext 3+; 01/29/2020: 4/5 for most hip motions, 3+/5 for abd, ext    Time  8    Period  Weeks    Status  On-going       PT LONG TERM GOAL #2   Title  Patient will demonstrate walking tolerance increased to 30 minutes without increase in symptoms to demonstrate improved ability to perform walking program for cardiovascular health.    Baseline  10 minute tolerance (patient report); 12/22 10 minutes (per pt report); 11/09/2019: 7 min; 12/07/2019 unable to assess due to icy streets;  01/29/2020: 10 min    Time  8    Period  Weeks    Status  On-going      PT LONG TERM GOAL #3   Title  Patient will report worst pain of 3/10 during therapy to demonstrate reduced disability    Baseline  Worst 7/10; 12/22 worst 9/10; 11/09/2019: 9/10; 12/07/2019 7/10; 01/29/2020: 7/10    Time  8    Period  Weeks    Status  On-going      PT LONG TERM GOAL #4   Title  Patient will demonstrate ability to perform all bed mobility maneuvers without increase in pain for reduced disability with mobility and transfers with ADLs.    Baseline  Reports pain with bed mobility supine <> sidelying L/R, sidelying to sitting EOB; 12/22 pt able to perform I without increased pain    Time  8    Period  Weeks    Status  Achieved      PT LONG TERM GOAL #5   Title  Patient will improve 10MW speed to 1.0 m/sec to meet norms for minimal fall risk and full community ambulation.    Baseline  0.88 m/sec; 12/22 0.96 m/sec; 11/09/2019: deferred; 12/07/2019 0.90 m/sec; 01/29/2020: .9  m/sec    Time  8    Period  Weeks    Status  On-going      Additional Long Term Goals   Additional Long Term Goals  Yes      PT LONG TERM GOAL #6   Title  Patient will increase 6MWT by 200 ft to demonstrate improvement in functional capacity for transition to cardiac walking program    Baseline  6MWT = 1195; 1/7/20201: deferred; 12/07/2019 1095; 01/29/2020: deferred secondary to fatigue    Time  8    Period  Weeks    Status  Deferred      PT LONG TERM GOAL #7   Title  Patient will improve her 5xsts to under 15 sec to indicate singificnat improvement in LE functional strength and  decrease in fall risk.    Baseline  24.5 sec    Time  6    Period  Weeks    Status  New            Plan - 02/14/20 1300    Clinical Impression Statement  Patient demonstrates no increase in pain along her hip on the affected side and is limited in terms of overall fatigue. Started to encourage large motions to help against the onset of Parkinson's with the patient. Patient continues to fatigue quickly. Patient will benefit from further skilled therapy to return to prior level of function.    Personal Factors and Comorbidities  Age;Comorbidity 3+;Time since onset of injury/illness/exacerbation;Comorbidity 2    Comorbidities  MCI, HTN, CA, cataracts    Examination-Activity Limitations  Bend;Sit;Squat;Stairs;Stand;Sleep    Examination-Participation Restrictions  Cleaning;Community Activity    Stability/Clinical Decision Making  Evolving/Moderate complexity    Rehab Potential  Fair    Clinical Impairments Affecting Rehab Potential  MCI    PT Frequency  2x / week   Eval and 1 f/u visit   PT Duration  6 weeks    PT Treatment/Interventions  ADLs/Self Care Home Management;Therapeutic exercise;Patient/family education;Neuromuscular re-education;Therapeutic activities;Functional mobility training;Balance training;Manual techniques;Gait training;Stair training;Moist Heat;Electrical Stimulation;Cryotherapy;Taping;Dry needling;Energy conservation;Passive range of motion;Joint Manipulations    PT Next Visit Plan  Progress glute therex, pelvic-hip dissociation; check and progress HEP with stretches    PT Home Exercise Plan  lateral step ups, step ups, hip kickers ext/abd red theraband    Consulted and Agree with Plan of Care  Patient       Patient will benefit from skilled therapeutic  intervention in order to improve the following deficits and impairments:  Decreased range of motion, Postural dysfunction, Pain, Abnormal gait, Decreased activity tolerance, Decreased coordination, Decreased endurance,  Decreased balance, Decreased mobility, Difficulty walking, Hypomobility, Increased muscle spasms, Impaired perceived functional ability, Decreased strength, Impaired flexibility  Visit Diagnosis: Muscle weakness (generalized)  Difficulty in walking, not elsewhere classified     Problem List Patient Active Problem List   Diagnosis Date Noted  . 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 Bipolar disorder, in full remission, most recent episode depressed ([place]) 11/08/2019  . Avitaminosis D 08/24/2019  . Insomnia due to mental condition 05/31/2019  . Bipolar I disorder, most recent episode depressed ([place]) 05/31/2019  . Mild cognitive disorder 05/31/2019  . Alcohol use disorder, moderate, in sustained remission ([place]) 05/31/2019  . Elevated tumor markers 05/27/2019  . GAD (generalized anxiety disorder) 04/10/2019  . Altered mental status 04/10/2019  . MDD (major depressive disorder), severe ([place]) 01/31/2019  . High serum carbohydrate antigen 19-9 (CA19-9) 12/01/2018  . Adjustment disorder with anxious mood 11/24/2018  . Essential hypertension 11/24/2018  . Screening for lipid disorders 11/24/2018  . B12 deficiency 10/17/2018  . Incisional hernia, without obstruction or gangrene 09/12/2018  . Genetic testing 04/07/2018  . Family history of breast cancer 03/24/2018  . Family history of lung cancer 03/24/2018  . Malignant neoplasm of upper-outer quadrant of right breast in female, estrogen receptor positive ([place]) 03/17/2018  . Malignant neoplasm of upper-inner quadrant of right breast in female, estrogen receptor positive ([place]) 03/17/2018  . History of alcohol dependence ([place]) 02/07/2018  . History of psychosis 02/07/2018  . History of insomnia 02/07/2018    Blythe Stanford, PT DPT 02/14/2020, 1:27 PM  [facility] [address], [place], [address] Phone: [phone]   Fax:  [phone]  Name: Kalana Stegenga MRN:  [id number] Date of Birth: 02/13/1946

## 2020-02-15 ENCOUNTER — Telehealth: Payer: Self-pay

## 2020-02-15 NOTE — Telephone Encounter (Signed)
Copied from [place] [phone]. Topic: General - Other >> Feb 15, 2020  2:55 PM Rainey Pines A wrote: Patient would like a callback from nurse in regards to the dosage for Naproxen that was discussed with Dr. Jacinto Reap. Please advise

## 2020-02-16 NOTE — Telephone Encounter (Signed)
LMTCB

## 2020-02-16 NOTE — Telephone Encounter (Signed)
Patient reports that she spoke to pharmacist and they told her to take Naproxen 1 tablet every 12 hours as needed. Patient reports she will discuss more about other medications for pain at next office visit on 02/22/2020.

## 2020-02-19 NOTE — Progress Notes (Signed)
Established patient visit    Patient: Kendra [name]   DOB: [birth year]   74 y.o. Female  MRN: [id number] Visit Date: 02/22/2020  Today's healthcare provider: Lavon Paganini, MD   Chief Complaint  Patient presents with  . Hypertension  . Dizziness   Subjective    HPI   Hypertension, follow-up  BP Readings from Last 3 Encounters:  02/22/20 133/75  01/30/20 138/75  11/22/19 132/71   Wt Readings from Last 3 Encounters:  02/22/20 203 lb 9.6 oz (92.4 kg)  01/30/20 204 lb (92.5 kg)  11/22/19 199 lb 12.8 oz (90.6 kg)     HTN: - Medications: lisinopril-hctz - Compliance: good - Checking BP at home: no - Denies any SOB, CP, vision changes, LE edema, medication SEs, or symptoms of hypotension - Diet: low sodium - Exercise: walking daily    Pertinent labs: Lab Results  Component Value Date   CHOL 173 04/13/2019   HDL 62 04/13/2019   LDLCALC 95 04/13/2019   TRIG 78 04/13/2019   Lab Results  Component Value Date   NA 129 (L) 12/22/2019   K 3.5 12/22/2019   CL 96 (L) 12/22/2019   CO2 26 12/22/2019   GLUCOSE 115 (H) 12/22/2019   BUN 10 12/22/2019   CREATININE 0.72 12/22/2019   CALCIUM 9.1 12/22/2019   GFRNONAA >60 12/22/2019   GFRAA >60 12/22/2019     The 10-year ASCVD risk score (Goff DC Jr., et al., 2013) is: 16%* (Cholesterol units were assumed)    Diagnosed with Parkinson's disease by Neurology.Taking Sinemet regularly 1/2 tab TID and going to PT.  Reports dizziness and unsteadiness - advised to talk to Neurology. ---------------------------------------------------------------------------------------------------  Social History   Tobacco Use  . Smoking status: Never Smoker  . Smokeless tobacco: Never Used  Substance Use Topics  . Alcohol use: Not Currently    Comment: alcohol dependence prior to 2007  . Drug use: Never       Medications: Outpatient Medications Prior to Visit  Medication Sig  . calcium citrate-vitamin D (CITRACAL+D) 315-200  MG-UNIT tablet Take 4 tablets by mouth daily.  . carbidopa-levodopa (SINEMET IR) 25-100 MG tablet Take 0.5 tablets by mouth in the morning, at noon, and at bedtime.  Marland Kitchen FLUoxetine HCl 60 MG TABS Take 60 mg by mouth daily.  Marland Kitchen ketoconazole (NIZORAL) 2 % cream Apply 1 application topically daily.  Marland Kitchen letrozole (FEMARA) 2.5 MG tablet Take 1 tablet (2.5 mg total) by mouth daily.  . polyethylene glycol (MIRALAX / GLYCOLAX) 17 g packet Take 17 g by mouth daily.  . risperiDONE (RISPERDAL) 3 MG tablet Take 0.5 tablets (1.5 mg total) by mouth 2 (two) times daily.  . traZODone (DESYREL) 50 MG tablet Take 0.5-1 tablets (25-50 mg total) by mouth at bedtime as needed for sleep.  . [DISCONTINUED] cyanocobalamin (,VITAMIN B-12,) 1000 MCG/ML injection Inject 1 mL (1,000 mcg total) into the muscle every 30 (thirty) days.  . [DISCONTINUED] lisinopril-hydrochlorothiazide (ZESTORETIC) 20-12.5 MG tablet Take 1 tablet by mouth daily.  Marland Kitchen buPROPion (WELLBUTRIN XL) 300 MG 24 hr tablet Take 300 mg by mouth daily.  . [DISCONTINUED] diazepam (VALIUM) 10 MG tablet Take 1 tablet by mouth once as needed. Prior to procedure  . [DISCONTINUED] FLUoxetine HCl 60 MG TABS Take 60 mg by mouth daily.   Facility-Administered Medications Prior to Visit  Medication Dose Route Frequency Provider  . lidocaine (PF) (XYLOCAINE) 1 % injection 4 mL  4 mL Intradermal Once Virginia Crews, MD  . lidocaine (PF) (  XYLOCAINE) 1 % injection 4 mL  4 mL Intradermal Once Virginia Crews, MD  . methylPREDNISolone acetate (DEPO-MEDROL) injection 40 mg  40 mg Intramuscular Once Virginia Crews, MD  . methylPREDNISolone acetate (DEPO-MEDROL) injection 40 mg  40 mg Intramuscular Once Virginia Crews, MD    Review of Systems  Last metabolic panel Lab Results  Component Value Date   GLUCOSE 115 (H) 12/22/2019   NA 129 (L) 12/22/2019   K 3.5 12/22/2019   CL 96 (L) 12/22/2019   CO2 26 12/22/2019   BUN 10 12/22/2019   CREATININE 0.72  12/22/2019   GFRNONAA >60 12/22/2019   GFRAA >60 12/22/2019   CALCIUM 9.1 12/22/2019   PROT 6.7 12/22/2019   ALBUMIN 3.9 12/22/2019   LABGLOB 1.7 07/13/2019   AGRATIO 2.5 (H) 07/13/2019   BILITOT 0.6 12/22/2019   ALKPHOS 47 12/22/2019   AST 22 12/22/2019   ALT 25 12/22/2019   ANIONGAP 7 12/22/2019       Objective    BP 133/75 (BP Location: Right Arm, Patient Position: Sitting, Cuff Size: Normal)   Pulse 84   Temp (!) 96.2 F (35.7 C) (Temporal)   Resp 16   Ht 5\' 6"  (1.676 m)   Wt 203 lb 9.6 oz (92.4 kg)   BMI 32.86 kg/m  BP Readings from Last 3 Encounters:  02/22/20 133/75  01/30/20 138/75  11/22/19 132/71   Wt Readings from Last 3 Encounters:  02/22/20 203 lb 9.6 oz (92.4 kg)  01/30/20 204 lb (92.5 kg)  11/22/19 199 lb 12.8 oz (90.6 kg)      Physical Exam Vitals reviewed.  Constitutional:      General: She is not in acute distress.    Appearance: Normal appearance. She is well-developed. She is not diaphoretic.  HENT:     Head: Normocephalic and atraumatic.  Eyes:     General: No scleral icterus.    Conjunctiva/sclera: Conjunctivae normal.  Neck:     Thyroid: No thyromegaly.  Cardiovascular:     Rate and Rhythm: Normal rate and regular rhythm.     Pulses: Normal pulses.     Heart sounds: Normal heart sounds. No murmur.  Pulmonary:     Effort: Pulmonary effort is normal. No respiratory distress.     Breath sounds: Normal breath sounds. No wheezing, rhonchi or rales.  Abdominal:     General: There is no distension.     Tenderness: There is no abdominal tenderness.  Musculoskeletal:     Cervical back: Neck supple.     Right lower leg: No edema.     Left lower leg: No edema.  Lymphadenopathy:     Cervical: No cervical adenopathy.  Skin:    General: Skin is warm and dry.     Findings: No rash.  Neurological:     Mental Status: She is alert and oriented to person, place, and time. Mental status is at baseline.  Psychiatric:        Mood and Affect:  Mood normal.        Behavior: Behavior normal.       No results found for any visits on 02/22/20.   Assessment & Plan     Problem List Items Addressed This Visit      Cardiovascular and Mediastinum   Essential hypertension - Primary    Well controlled Continue current medications Reviewed recent metabolic panel F/u in 6 months       Relevant Medications   lisinopril-hydrochlorothiazide (ZESTORETIC) 20-12.5 MG  tablet   Other Relevant Orders   Basic Metabolic Panel (BMET)     Nervous and Auditory   Parkinson disease ([place])    Recent diagnosis Tolerating low-dose Sinemet Followed by neurology Undergoing physical therapy and Occupational Therapy currently We discussed her dizziness and unsteadiness in depth Encouraged her to continue with rehab and continue follow-up with neurology Discussed that there is no quick fix in the setting of Parkinson's and symptoms are likely to progress over time, but the therapy and medications can help control it      Relevant Medications   carbidopa-levodopa (SINEMET IR) 25-100 MG tablet     Other   History of alcohol dependence ([place])    Stable and well controlled No alcohol intake since 2007      Malignant neoplasm of upper-outer quadrant of right breast in female, estrogen receptor positive ([place])    Followed by Oncology  no changes to medications      Malignant neoplasm of upper-inner quadrant of right breast in female, estrogen receptor positive ([place])    S/p R breast lumpectomy with sentinel LN biopsy as well as radiation and now on antiestrogen therapy Followed by Oncology      B12 deficiency    Also followed by oncology and neurology On monthly B12 injections at home Refill of B12 injection sent to pharmacy Recheck B12 level      Relevant Orders   B12   MDD (major depressive disorder), severe ([place])    Followed by Psych Seeing a therapist No changes to medications      Relevant Medications   buPROPion (WELLBUTRIN  XL) 300 MG 24 hr tablet   Bipolar I disorder, most recent episode depressed ([place])    Followed by psychiatry Also seeing therapy No changes to medications       Other Visit Diagnoses    Hyponatremia       Relevant Orders   Basic Metabolic Panel (BMET)       Total time spent on today's visit was greater than 45 minutes, including both face-to-face time and nonface-to-face time personally spent on review of chart (labs and imaging), discussing labs and goals, discussing further work-up, treatment options, referrals to specialist if needed, reviewing outside records of pertinent, answering patient's questions, and coordinating care.   Return in about 6 months (around 08/23/2020) for CPE/AWV.      I, Lavon Paganini, MD, have reviewed all documentation for this visit. The documentation on 02/22/20 for the exam, diagnosis, procedures, and orders are all accurate and complete.   Caili Escalera, Dionne Bucy, MD, MPH [facility]

## 2020-02-20 ENCOUNTER — Other Ambulatory Visit: Payer: Self-pay

## 2020-02-20 ENCOUNTER — Ambulatory Visit: Payer: Medicare PPO

## 2020-02-20 DIAGNOSIS — R262 Difficulty in walking, not elsewhere classified: Secondary | ICD-10-CM | POA: Diagnosis not present

## 2020-02-20 DIAGNOSIS — M6281 Muscle weakness (generalized): Secondary | ICD-10-CM

## 2020-02-20 DIAGNOSIS — M25522 Pain in left elbow: Secondary | ICD-10-CM | POA: Diagnosis not present

## 2020-02-20 DIAGNOSIS — M25551 Pain in right hip: Secondary | ICD-10-CM | POA: Diagnosis not present

## 2020-02-20 DIAGNOSIS — M25552 Pain in left hip: Secondary | ICD-10-CM

## 2020-02-20 DIAGNOSIS — M25622 Stiffness of left elbow, not elsewhere classified: Secondary | ICD-10-CM | POA: Diagnosis not present

## 2020-02-21 ENCOUNTER — Ambulatory Visit: Payer: Medicare PPO | Admitting: Occupational Therapy

## 2020-02-21 DIAGNOSIS — R262 Difficulty in walking, not elsewhere classified: Secondary | ICD-10-CM | POA: Diagnosis not present

## 2020-02-21 DIAGNOSIS — M6281 Muscle weakness (generalized): Secondary | ICD-10-CM | POA: Diagnosis not present

## 2020-02-21 DIAGNOSIS — M25622 Stiffness of left elbow, not elsewhere classified: Secondary | ICD-10-CM

## 2020-02-21 DIAGNOSIS — M25552 Pain in left hip: Secondary | ICD-10-CM | POA: Diagnosis not present

## 2020-02-21 DIAGNOSIS — M25551 Pain in right hip: Secondary | ICD-10-CM | POA: Diagnosis not present

## 2020-02-21 DIAGNOSIS — M25522 Pain in left elbow: Secondary | ICD-10-CM | POA: Diagnosis not present

## 2020-02-21 NOTE — Patient Instructions (Signed)
See note

## 2020-02-21 NOTE — Therapy (Signed)
[facility] [address], [place], [address] Phone: [phone]   Fax:  [phone]  Physical Therapy Treatment  Patient Details  Name: Kendra [name] MRN: [id number] Date of Birth: 1946/01/15 No data recorded  Encounter Date: 02/20/2020  PT End of Session - 02/21/20 1540    Visit Number  32    Number of Visits  38    Date for PT Re-Evaluation  02/29/20    Authorization Type  4/10    PT Start Time  1600    PT Stop Time  1645    PT Time Calculation (min)  45 min    Equipment Utilized During Treatment  Gait belt    Activity Tolerance  Patient tolerated treatment well;No increased pain    Behavior During Therapy  Flat affect       Past Medical History:  Diagnosis Date  . Anxiety   . Breast cancer ([place]) 03/2018   right breast cancer at 11:00 and 1:00  . Bursitis    bilateral hips and knees  . Complication of anesthesia   . Depression   . Family history of breast cancer 03/24/2018  . Family history of lung cancer 03/24/2018  . History of alcohol dependence ([place]) 2007   no ETOH; resolved since 2007  . History of hiatal hernia   . History of psychosis 2014   due to sleep disturbance  . Hypertension   . Personal history of radiation therapy 06/2018-07/2018   right breast ca  . PONV (postoperative nausea and vomiting)     Past Surgical History:  Procedure Laterality Date  . BREAST BIOPSY Right 03/14/2018   11:00 DCIS and invasive ductal carcinoma  . BREAST BIOPSY Right 03/14/2018   1:00 Invasive ductal carcinoma  . BREAST LUMPECTOMY Right 04/27/2018   lumpectomy of 11 and 1:00 cancers, clear margins, negative LN  . BREAST LUMPECTOMY WITH RADIOACTIVE SEED AND SENTINEL LYMPH NODE BIOPSY Right 04/27/2018   Procedure: RIGHT BREAST LUMPECTOMY WITH RADIOACTIVE SEED X 2 AND RIGHT SENTINEL LYMPH NODE BIOPSY;  Surgeon: [name], MD;  Location: [facility];  Service: General;   Laterality: Right;  . CATARACT EXTRACTION W/PHACO Left 08/30/2019   Procedure: CATARACT EXTRACTION PHACO AND INTRAOCULAR LENS PLACEMENT ([place]) LEFT panoptix toric  01:20.5  12.7%  10.26;  Surgeon: Leandrew Koyanagi, MD;  Location: [facility];  Service: Ophthalmology;  Laterality: Left;  . CATARACT EXTRACTION W/PHACO Right 09/20/2019   Procedure: CATARACT EXTRACTION PHACO AND INTRAOCULAR LENS PLACEMENT ([facility]) RIGHT 5.71  00:36.4  15.7%;  Surgeon: Leandrew Koyanagi, MD;  Location: [facility];  Service: Ophthalmology;  Laterality: Right;  . LAPAROTOMY N/A 02/10/2018   Procedure: EXPLORATORY LAPAROTOMY;  Surgeon: Isabel Caprice, MD;  Location: [facility];  Service: Gynecology;  Laterality: N/A;  . MASS EXCISION  01/2018   abdominal  . OVARIAN CYST REMOVAL    . SALPINGOOPHORECTOMY Bilateral 02/10/2018   Procedure: BILATERAL SALPINGO OOPHORECTOMY; PERITONEAL WASHINGS;  Surgeon: Isabel Caprice, MD;  Location: [facility];  Service: Gynecology;  Laterality: Bilateral;  . TONSILLECTOMY    . TONSILLECTOMY AND ADENOIDECTOMY  1953    There were no vitals filed for this visit.  Subjective Assessment - 02/20/20 1652    Subjective  Patient states she is feeling shaking and a little dizzy today. Patient states she took L DOPA before the session today.    Pertinent History  Patient report unclear regarding onset. Visited MD on Sept 16 and received  injection with relief for approximately 1 month. Reports she cannot receive an additional injection pending eye surgery. Received dx from MD of hip bursitis in B hips. Reports pain with walking, stanindg, sitting 5/10 current 7/10 worst 3/10 best, with temporary relief when she is sitting "just right" and with naproxen. Agg: activity. Cormorbidities: hx of breast cancer, HTN, MCI, insomnia, cataracts    Limitations  Sitting;Standing;Walking;House hold activities    How long can you sit comfortably?  Pain constant, reduced with neutral pelvic tilt     How long can you stand comfortably?  Pain constant, increases with time    How long can you walk comfortably?  Pain constant, increases with time    Patient Stated Goals  Be able to move without hurting, walk 1 hour    Currently in Pain?  No/denies    Pain Onset  More than a month ago              TREATMENT Therapeutic Exercise Side stepping with RTB - x 20  Sit to stands with RTB around knees - x 10 Side stepping with overhead UE movement - x 10 Hip extension in standing - x 20 Agility ladder in standing with UE movement forward, laterally - x 20 Side stepping up and over airex pad - x15 Hip abduction in standing - x 15 Hip abduction to extension - x20  Performed exercises to address hip weakness and balance limitations   PT Education - 02/21/20 1539    Education provided  Yes    Education Details  form/technique with exercise    Person(s) Educated  Patient    Methods  Explanation;Demonstration    Comprehension  Verbalized understanding;Returned demonstration       PT Short Term Goals - 09/12/19 1419      PT SHORT TERM GOAL #1   Title  Patient will demonstrate complaince with HEP to speed recovery and reduce total number of visits.    Baseline  HEP given    Time  2    Period  Weeks    Status  New    Target Date  09/27/19        PT Long Term Goals - 01/29/20 1015      PT LONG TERM GOAL #1   Title  Patient will improve hip strength to 4/5 grossly to facilitate improved mobility with ADLs.    Baseline  3, 4-; 12/22 deferred to NV; 11/09/2019: 4-/5; 12/07/2019 L hip flexor 4, abd and ext 3+; 01/29/2020: 4/5 for most hip motions, 3+/5 for abd, ext    Time  8    Period  Weeks    Status  On-going      PT LONG TERM GOAL #2   Title  Patient will demonstrate walking tolerance increased to 30 minutes without increase in symptoms to demonstrate improved ability to perform walking program for cardiovascular health.    Baseline  10 minute tolerance (patient report); 12/22  10 minutes (per pt report); 11/09/2019: 7 min; 12/07/2019 unable to assess due to icy streets;  01/29/2020: 10 min    Time  8    Period  Weeks    Status  On-going      PT LONG TERM GOAL #3   Title  Patient will report worst pain of 3/10 during therapy to demonstrate reduced disability    Baseline  Worst 7/10; 12/22 worst 9/10; 11/09/2019: 9/10; 12/07/2019 7/10; 01/29/2020: 7/10    Time  8    Period  [place]  Status  On-going      PT LONG TERM GOAL #4   Title  Patient will demonstrate ability to perform all bed mobility maneuvers without increase in pain for reduced disability with mobility and transfers with ADLs.    Baseline  Reports pain with bed mobility supine <> sidelying L/R, sidelying to sitting EOB; 12/22 pt able to perform I without increased pain    Time  8    Period  Weeks    Status  Achieved      PT LONG TERM GOAL #5   Title  Patient will improve 10MW speed to 1.0 m/sec to meet norms for minimal fall risk and full community ambulation.    Baseline  0.88 m/sec; 12/22 0.96 m/sec; 11/09/2019: deferred; 12/07/2019 0.90 m/sec; 01/29/2020: .9  m/sec    Time  8    Period  Weeks    Status  On-going      Additional Long Term Goals   Additional Long Term Goals  Yes      PT LONG TERM GOAL #6   Title  Patient will increase 6MWT by 200 ft to demonstrate improvement in functional capacity for transition to cardiac walking program    Baseline  6MWT = 1195; 1/7/20201: deferred; 12/07/2019 1095; 01/29/2020: deferred secondary to fatigue    Time  8    Period  Weeks    Status  Deferred      PT LONG TERM GOAL #7   Title  Patient will improve her 5xsts to under 15 sec to indicate singificnat improvement in LE functional strength and decrease in fall risk.    Baseline  24.5 sec    Time  6    Period  Weeks    Status  New            Plan - 02/21/20 1542    Clinical Impression Statement  Patient with increased fatigue today requiring frequent rest breaks, this seems to be from Neurological based  symptoms versus musculoskeletal symptoms. Began to encorporate larger movements of the LE and UEs concurrently to improve coordination overall. Patient demonstrates increased difficulty most notably within narrow BOS positions indicating poor dynamic balance and decreased muscular coordination. Patient will benefit from further skilled therapy to return to prior level of function.    Personal Factors and Comorbidities  Age;Comorbidity 3+;Time since onset of injury/illness/exacerbation;Comorbidity 2    Comorbidities  MCI, HTN, CA, cataracts    Examination-Activity Limitations  Bend;Sit;Squat;Stairs;Stand;Sleep    Examination-Participation Restrictions  Cleaning;Community Activity    Stability/Clinical Decision Making  Evolving/Moderate complexity    Rehab Potential  Fair    Clinical Impairments Affecting Rehab Potential  MCI    PT Frequency  2x / week   Eval and 1 f/u visit   PT Duration  6 weeks    PT Treatment/Interventions  ADLs/Self Care Home Management;Therapeutic exercise;Patient/family education;Neuromuscular re-education;Therapeutic activities;Functional mobility training;Balance training;Manual techniques;Gait training;Stair training;Moist Heat;Electrical Stimulation;Cryotherapy;Taping;Dry needling;Energy conservation;Passive range of motion;Joint Manipulations    PT Next Visit Plan  Progress glute therex, pelvic-hip dissociation; check and progress HEP with stretches    PT Home Exercise Plan  lateral step ups, step ups, hip kickers ext/abd red theraband    Consulted and Agree with Plan of Care  Patient       Patient will benefit from skilled therapeutic intervention in order to improve the following deficits and impairments:  Decreased range of motion, Postural dysfunction, Pain, Abnormal gait, Decreased activity tolerance, Decreased coordination, Decreased endurance, Decreased balance, Decreased mobility, Difficulty  walking, Hypomobility, Increased muscle spasms, Impaired perceived  functional ability, Decreased strength, Impaired flexibility  Visit Diagnosis: Muscle weakness (generalized)  Difficulty in walking, not elsewhere classified  Pain in left hip     Problem List Patient Active Problem List   Diagnosis Date Noted  . 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 Bipolar disorder, in full remission, most recent episode depressed ([place]) 11/08/2019  . Avitaminosis D 08/24/2019  . Insomnia due to mental condition 05/31/2019  . Bipolar I disorder, most recent episode depressed ([place]) 05/31/2019  . Mild cognitive disorder 05/31/2019  . Alcohol use disorder, moderate, in sustained remission ([place]) 05/31/2019  . Elevated tumor markers 05/27/2019  . GAD (generalized anxiety disorder) 04/10/2019  . Altered mental status 04/10/2019  . MDD (major depressive disorder), severe ([place]) 01/31/2019  . High serum carbohydrate antigen 19-9 (CA19-9) 12/01/2018  . Adjustment disorder with anxious mood 11/24/2018  . Essential hypertension 11/24/2018  . Screening for lipid disorders 11/24/2018  . B12 deficiency 10/17/2018  . Incisional hernia, without obstruction or gangrene 09/12/2018  . Genetic testing 04/07/2018  . Family history of breast cancer 03/24/2018  . Family history of lung cancer 03/24/2018  . Malignant neoplasm of upper-outer quadrant of right breast in female, estrogen receptor positive ([place]) 03/17/2018  . Malignant neoplasm of upper-inner quadrant of right breast in female, estrogen receptor positive ([place]) 03/17/2018  . History of alcohol dependence ([place]) 02/07/2018  . History of psychosis 02/07/2018  . History of insomnia 02/07/2018    Blythe Stanford, PT DPT 02/21/2020, 3:46 PM  [facility] [address], [place], [address] Phone: [phone]   Fax:  [phone]  Name: Kendra [name] MRN: [id number] Date of Birth: [birth year]

## 2020-02-21 NOTE — Therapy (Signed)
[facility] [address] [place], [address] Phone: [phone]   Fax:  [phone]  Occupational Therapy Treatment  Patient Details  Name: Kendra [name] MRN: [id number] Date of Birth: [birth year] Referring Provider (OT): Vance Peper Date: 02/21/2020  OT End of Session - 02/21/20 1615    Visit Number  6    Number of Visits  10    Date for OT Re-Evaluation  03/20/20    OT Start Time  1520    OT Stop Time  1603    OT Time Calculation (min)  43 min    Activity Tolerance  Patient tolerated treatment well    Behavior During Therapy  Flat affect       Past Medical History:  Diagnosis Date  . Anxiety   . Breast cancer ([place]) 03/2018   right breast cancer at 11:00 and 1:00  . Bursitis    bilateral hips and knees  . Complication of anesthesia   . Depression   . Family history of breast cancer 03/24/2018  . Family history of lung cancer 03/24/2018  . History of alcohol dependence ([place]) 2007   no ETOH; resolved since 2007  . History of hiatal hernia   . History of psychosis 2014   due to sleep disturbance  . Hypertension   . Personal history of radiation therapy 06/2018-07/2018   right breast ca  . PONV (postoperative nausea and vomiting)     Past Surgical History:  Procedure Laterality Date  . BREAST BIOPSY Right 03/14/2018   11:00 DCIS and invasive ductal carcinoma  . BREAST BIOPSY Right 03/14/2018   1:00 Invasive ductal carcinoma  . BREAST LUMPECTOMY Right 04/27/2018   lumpectomy of 11 and 1:00 cancers, clear margins, negative LN  . BREAST LUMPECTOMY WITH RADIOACTIVE SEED AND SENTINEL LYMPH NODE BIOPSY Right 04/27/2018   Procedure: RIGHT BREAST LUMPECTOMY WITH RADIOACTIVE SEED X 2 AND RIGHT SENTINEL LYMPH NODE BIOPSY;  Surgeon: [name], MD;  Location: [facility];  Service: General;  Laterality: Right;  . CATARACT EXTRACTION W/PHACO Left 08/30/2019   Procedure:  CATARACT EXTRACTION PHACO AND INTRAOCULAR LENS PLACEMENT ([place]) LEFT panoptix toric  01:20.5  12.7%  10.26;  Surgeon: Leandrew Koyanagi, MD;  Location: [facility];  Service: Ophthalmology;  Laterality: Left;  . CATARACT EXTRACTION W/PHACO Right 09/20/2019   Procedure: CATARACT EXTRACTION PHACO AND INTRAOCULAR LENS PLACEMENT ([facility]) RIGHT 5.71  00:36.4  15.7%;  Surgeon: Leandrew Koyanagi, MD;  Location: [facility];  Service: Ophthalmology;  Laterality: Right;  . LAPAROTOMY N/A 02/10/2018   Procedure: EXPLORATORY LAPAROTOMY;  Surgeon: Isabel Caprice, MD;  Location: [facility];  Service: Gynecology;  Laterality: N/A;  . MASS EXCISION  01/2018   abdominal  . OVARIAN CYST REMOVAL    . SALPINGOOPHORECTOMY Bilateral 02/10/2018   Procedure: BILATERAL SALPINGO OOPHORECTOMY; PERITONEAL WASHINGS;  Surgeon: Isabel Caprice, MD;  Location: [facility];  Service: Gynecology;  Laterality: Bilateral;  . TONSILLECTOMY    . TONSILLECTOMY AND ADENOIDECTOMY  1953    There were no vitals filed for this visit.  Subjective Assessment - 02/21/20 1614    Subjective   I have call in to the Dr - I am still dizzy - that was my symptoms before and it has been worse - but I did the wall pushups and the 2 lbs weight for my arm - getting easier    Pertinent History  Pt fell 11/23/2019 and fracture her R  elbow - minimal displaced radius head fx - pt in sling if she is out and about - but refer to OT for ROM ,PROM and weight limited 1-2 lbs -    Patient Stated Goals  I would like the ROM , strength better so I can push up from chair and have no pain    Currently in Pain?  No/denies       Assess pt with picking up and carry 4  And 5 lbs - no compensating with body and no symptoms  But with 6 and 7 lbs - to heavy to lift - some discomfort - compensate with body   [facility] OT Assessment - 02/21/20 0001      Strength   Right Hand Grip (lbs)  35    Right Hand Lateral Pinch  16 lbs    Right Hand 3 Point Pinch  17  lbs    Left Hand Grip (lbs)  27    Left Hand Lateral Pinch  15 lbs    Left Hand 3 Point Pinch  16 lbs       pt had full extention after heating pad   Increase pt to 3 lbs  12 reps pain free for elbow flexion and extention against wall and to focus on end range elbow extention - to do palm up and thumb up -  Increase to 2nd set in 3 days if pain free 2x day   Add dark green firm to teal putty to HEP for grip   Can do1sets12-15 reps and increase to 2nd set in 3 days if pain free - to  get fingers into putty   Cont with Wall pushup doing well with per pt - 3 sets of 10-12 pain free   Add this date RTB ( 4.5lbs) for scapula squeezes  And shoulder extention 12 reps  For both - increase in 3 days if no issues - to 2nd set          OT Treatments/Exercises (OP) - 02/21/20 0001      Moist Heat Therapy   Number Minutes Moist Heat  8 Minutes    Moist Heat Location  Elbow   heatingpad with elbow end range stretch             OT Education - 02/21/20 1615    Education Details  progress and HEP    Person(s) Educated  Patient    Methods  Explanation;Demonstration;Tactile cues;Verbal cues;Handout    Comprehension  Verbal cues required;Returned demonstration;Verbalized understanding       OT Short Term Goals - 02/21/20 1619      OT SHORT TERM GOAL #1   Title  Pt to be independent in HEP to increase L elbow extention to -5 and increase strenght to carry more than 5 lbs    Baseline  elbow extention AROM -5 and PROM and in session 0 - carry and lift 5 lbs with ease    Status  Achieved        OT Long Term Goals - 02/21/20 1619      OT LONG TERM GOAL #1   Title  L Elbow extention improve for pt to pull shirt over head, push up and carry more than 8 lbs without increase symptoms    Baseline  0 to -5 for extention , upgrade to 3 lbs for elbow HEP , no pain reported but can carry and lift 5 lbs - but more compensate with body  has some discomfort  Time  4     Period  Weeks    Status  On-going    Target Date  03/20/20      OT LONG TERM GOAL #2   Title  Pt AROM and strength in L elbow improve to prior level for function    Baseline  can do wall pushups , dress , bath, - no pushing up out of bed or chair , groceries, laundery and fitting sheet still favor    Time  4    Period  Weeks    Status  On-going    Target Date  03/20/20            Plan - 02/21/20 1616    Clinical Impression Statement  Pt is 12 wks out from minimal fx of radius head - pt was diagnosis with Parkinson's last week and started some new medications- pt show great progress this date in grip and prehension strenght. Upgrade her to 3 lbs for elbow HEP ,and increase putty resistance. Pt able to pick up with comfort and carry 5 lbs - it more she compensate with body adn shoulder - add some band HEP to scapula and shoulder    OT Occupational Profile and History  Problem Focused Assessment - Including review of records relating to presenting problem    Occupational performance deficits (Please refer to evaluation for details):  ADL's;IADL's;Play;Leisure;Social Participation    Body Structure / Function / Physical Skills  ADL;Flexibility;ROM;UE functional use;Pain;Strength;IADL    Rehab Potential  Good    Clinical Decision Making  Limited treatment options, no task modification necessary    Comorbidities Affecting Occupational Performance:  May have comorbidities impacting occupational performance    Modification or Assistance to Complete Evaluation   No modification of tasks or assist necessary to complete eval    OT Frequency  1x / week    OT Duration  4 weeks    OT Treatment/Interventions  Self-care/ADL training;Therapeutic exercise;Patient/family education;Cryotherapy;Paraffin;Moist Heat;Manual Therapy;Passive range of motion;Therapeutic activities    Plan  assess progress with HEP    OT Home Exercise Plan  see pt instruction    Consulted and Agree with Plan of Care  Patient        Patient will benefit from skilled therapeutic intervention in order to improve the following deficits and impairments:   Body Structure / Function / Physical Skills: ADL, Flexibility, ROM, UE functional use, Pain, Strength, IADL       Visit Diagnosis: Stiffness of left elbow, not elsewhere classified - Plan: Ot plan of care cert/re-cert    Problem List Patient Active Problem List   Diagnosis Date Noted  . 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 Bipolar disorder, in full remission, most recent episode depressed ([place]) 11/08/2019  . Avitaminosis D 08/24/2019  . Insomnia due to mental condition 05/31/2019  . Bipolar I disorder, most recent episode depressed ([place]) 05/31/2019  . Mild cognitive disorder 05/31/2019  . Alcohol use disorder, moderate, in sustained remission ([place]) 05/31/2019  . Elevated tumor markers 05/27/2019  . GAD (generalized anxiety disorder) 04/10/2019  . Altered mental status 04/10/2019  . MDD (major depressive disorder), severe ([place]) 01/31/2019  . High serum carbohydrate antigen 19-9 (CA19-9) 12/01/2018  . Adjustment disorder with anxious mood 11/24/2018  . Essential hypertension 11/24/2018  . Screening for lipid disorders 11/24/2018  . B12 deficiency 10/17/2018  . Incisional hernia, without obstruction or gangrene 09/12/2018  . Genetic testing 04/07/2018  . Family history of breast cancer 03/24/2018  . Family history of lung  cancer 03/24/2018  . Malignant neoplasm of upper-outer quadrant of right breast in female, estrogen receptor positive ([place]) 03/17/2018  . Malignant neoplasm of upper-inner quadrant of right breast in female, estrogen receptor positive ([place]) 03/17/2018  . History of alcohol dependence ([place]) 02/07/2018  . History of psychosis 02/07/2018  . History of insomnia 02/07/2018    Rosalyn Gess OTR/L,CLT 02/21/2020, 4:24 PM  [facility] [address] [place],  [address] Phone: [phone]   Fax:  [phone]  Name: Kendra [name] MRN: [id number] Date of Birth: 1946/03/01

## 2020-02-22 ENCOUNTER — Ambulatory Visit: Payer: Medicare PPO | Admitting: Family Medicine

## 2020-02-22 ENCOUNTER — Other Ambulatory Visit: Payer: Self-pay

## 2020-02-22 VITALS — BP 133/75 | HR 84 | Temp 96.2°F | Resp 16 | Ht 66.0 in | Wt 203.6 lb

## 2020-02-22 DIAGNOSIS — F3130 Bipolar disorder, current episode depressed, mild or moderate severity, unspecified: Secondary | ICD-10-CM

## 2020-02-22 DIAGNOSIS — Z170 Estrogen receptor positive status [ER+]: Secondary | ICD-10-CM

## 2020-02-22 DIAGNOSIS — E538 Deficiency of other specified B group vitamins: Secondary | ICD-10-CM

## 2020-02-22 DIAGNOSIS — I10 Essential (primary) hypertension: Secondary | ICD-10-CM | POA: Diagnosis not present

## 2020-02-22 DIAGNOSIS — E871 Hypo-osmolality and hyponatremia: Secondary | ICD-10-CM

## 2020-02-22 DIAGNOSIS — C50411 Malignant neoplasm of upper-outer quadrant of right female breast: Secondary | ICD-10-CM | POA: Diagnosis not present

## 2020-02-22 DIAGNOSIS — F1021 Alcohol dependence, in remission: Secondary | ICD-10-CM

## 2020-02-22 DIAGNOSIS — G20A1 Parkinson's disease without dyskinesia, without mention of fluctuations: Secondary | ICD-10-CM | POA: Insufficient documentation

## 2020-02-22 DIAGNOSIS — C50211 Malignant neoplasm of upper-inner quadrant of right female breast: Secondary | ICD-10-CM | POA: Diagnosis not present

## 2020-02-22 DIAGNOSIS — F322 Major depressive disorder, single episode, severe without psychotic features: Secondary | ICD-10-CM | POA: Diagnosis not present

## 2020-02-22 DIAGNOSIS — G20 Parkinson's disease: Secondary | ICD-10-CM | POA: Diagnosis not present

## 2020-02-22 MED ORDER — LISINOPRIL-HYDROCHLOROTHIAZIDE 20-12.5 MG PO TABS: 1.0000 | ORAL_TABLET | Freq: Every day | ORAL | 3 refills | Status: DC

## 2020-02-22 MED ORDER — CYANOCOBALAMIN 1000 MCG/ML IJ SOLN: 1000.0000 ug | INTRAMUSCULAR | 3 refills | Status: DC

## 2020-02-22 NOTE — Assessment & Plan Note (Signed)
Followed by psychiatry Also seeing therapy No changes to medications

## 2020-02-22 NOTE — Assessment & Plan Note (Signed)
Followed by Oncology  no changes to medications

## 2020-02-22 NOTE — Assessment & Plan Note (Signed)
Also followed by oncology and neurology On monthly B12 injections at home Refill of B12 injection sent to pharmacy Recheck B12 level

## 2020-02-22 NOTE — Assessment & Plan Note (Signed)
S/p R breast lumpectomy with sentinel LN biopsy as well as radiation and now on antiestrogen therapy Followed by Oncology

## 2020-02-22 NOTE — Assessment & Plan Note (Signed)
Well controlled Continue current medications Reviewed recent metabolic panel F/u in 6 months  

## 2020-02-22 NOTE — Assessment & Plan Note (Signed)
Followed by Psych Seeing a therapist No changes to medications

## 2020-02-22 NOTE — Assessment & Plan Note (Signed)
Stable and well controlled No alcohol intake since 2007

## 2020-02-22 NOTE — Assessment & Plan Note (Signed)
Recent diagnosis Tolerating low-dose Sinemet Followed by neurology Undergoing physical therapy and Occupational Therapy currently We discussed her dizziness and unsteadiness in depth Encouraged her to continue with rehab and continue follow-up with neurology Discussed that there is no quick fix in the setting of Parkinson's and symptoms are likely to progress over time, but the therapy and medications can help control it

## 2020-02-23 ENCOUNTER — Telehealth: Payer: Self-pay

## 2020-02-23 LAB — VITAMIN B12: Vitamin B-12: 672 pg/mL (ref 232–1245)

## 2020-02-23 LAB — BASIC METABOLIC PANEL
BUN/Creatinine Ratio: 8 — ABNORMAL LOW (ref 12–28)
BUN: 7 mg/dL — ABNORMAL LOW (ref 8–27)
CO2: 25 mmol/L (ref 20–29)
Calcium: 9.4 mg/dL (ref 8.7–10.3)
Chloride: 100 mmol/L (ref 96–106)
Creatinine, Ser: 0.87 mg/dL (ref 0.57–1.00)
GFR calc Af Amer: 77 mL/min/{1.73_m2} (ref >59)
GFR calc non Af Amer: 67 mL/min/{1.73_m2} (ref >59)
Glucose: 88 mg/dL (ref 65–99)
Potassium: 4 mmol/L (ref 3.5–5.2)
Sodium: 139 mmol/L (ref 134–144)

## 2020-02-23 NOTE — Telephone Encounter (Signed)
Left message advising pt.  (Per DPR)  Thanks,   -Parry Po  

## 2020-02-23 NOTE — Telephone Encounter (Signed)
Ok

## 2020-02-23 NOTE — Telephone Encounter (Signed)
-----   Message from Virginia Crews, MD sent at 02/23/2020  9:23 AM EDT ----- Normal labs

## 2020-02-29 ENCOUNTER — Other Ambulatory Visit: Payer: Self-pay

## 2020-02-29 ENCOUNTER — Ambulatory Visit: Payer: Medicare PPO | Admitting: Occupational Therapy

## 2020-02-29 ENCOUNTER — Ambulatory Visit: Payer: Medicare PPO

## 2020-02-29 DIAGNOSIS — M25552 Pain in left hip: Secondary | ICD-10-CM

## 2020-02-29 DIAGNOSIS — M25522 Pain in left elbow: Secondary | ICD-10-CM

## 2020-02-29 DIAGNOSIS — R262 Difficulty in walking, not elsewhere classified: Secondary | ICD-10-CM

## 2020-02-29 DIAGNOSIS — M6281 Muscle weakness (generalized): Secondary | ICD-10-CM

## 2020-02-29 DIAGNOSIS — M25551 Pain in right hip: Secondary | ICD-10-CM

## 2020-02-29 DIAGNOSIS — M25622 Stiffness of left elbow, not elsewhere classified: Secondary | ICD-10-CM | POA: Diagnosis not present

## 2020-02-29 NOTE — Patient Instructions (Signed)
See note for HEP changes

## 2020-02-29 NOTE — Therapy (Signed)
[facility] [address] [place], [address] Phone: [phone]   Fax:  [phone]  Occupational Therapy Treatment  Patient Details  Name: Kendra [name] MRN: [id number] Date of Birth: [birth year] Referring Provider (OT): Vance Peper Date: 02/29/2020  OT End of Session - 02/29/20 1112    Visit Number  7    Number of Visits  10    Date for OT Re-Evaluation  03/20/20    OT Start Time  0900    OT Stop Time  0940    OT Time Calculation (min)  40 min    Activity Tolerance  Patient tolerated treatment well    Behavior During Therapy  [facility] for tasks assessed/performed       Past Medical History:  Diagnosis Date  . Anxiety   . Breast cancer ([place]) 03/2018   right breast cancer at 11:00 and 1:00  . Bursitis    bilateral hips and knees  . Complication of anesthesia   . Depression   . Family history of breast cancer 03/24/2018  . Family history of lung cancer 03/24/2018  . History of alcohol dependence ([place]) 2007   no ETOH; resolved since 2007  . History of hiatal hernia   . History of psychosis 2014   due to sleep disturbance  . Hypertension   . Personal history of radiation therapy 06/2018-07/2018   right breast ca  . PONV (postoperative nausea and vomiting)     Past Surgical History:  Procedure Laterality Date  . BREAST BIOPSY Right 03/14/2018   11:00 DCIS and invasive ductal carcinoma  . BREAST BIOPSY Right 03/14/2018   1:00 Invasive ductal carcinoma  . BREAST LUMPECTOMY Right 04/27/2018   lumpectomy of 11 and 1:00 cancers, clear margins, negative LN  . BREAST LUMPECTOMY WITH RADIOACTIVE SEED AND SENTINEL LYMPH NODE BIOPSY Right 04/27/2018   Procedure: RIGHT BREAST LUMPECTOMY WITH RADIOACTIVE SEED X 2 AND RIGHT SENTINEL LYMPH NODE BIOPSY;  Surgeon: [name], MD;  Location: [facility];  Service: General;  Laterality: Right;  . CATARACT EXTRACTION W/PHACO Left  08/30/2019   Procedure: CATARACT EXTRACTION PHACO AND INTRAOCULAR LENS PLACEMENT ([place]) LEFT panoptix toric  01:20.5  12.7%  10.26;  Surgeon: Leandrew Koyanagi, MD;  Location: [facility];  Service: Ophthalmology;  Laterality: Left;  . CATARACT EXTRACTION W/PHACO Right 09/20/2019   Procedure: CATARACT EXTRACTION PHACO AND INTRAOCULAR LENS PLACEMENT ([facility]) RIGHT 5.71  00:36.4  15.7%;  Surgeon: Leandrew Koyanagi, MD;  Location: [facility];  Service: Ophthalmology;  Laterality: Right;  . LAPAROTOMY N/A 02/10/2018   Procedure: EXPLORATORY LAPAROTOMY;  Surgeon: Isabel Caprice, MD;  Location: [facility];  Service: Gynecology;  Laterality: N/A;  . MASS EXCISION  01/2018   abdominal  . OVARIAN CYST REMOVAL    . SALPINGOOPHORECTOMY Bilateral 02/10/2018   Procedure: BILATERAL SALPINGO OOPHORECTOMY; PERITONEAL WASHINGS;  Surgeon: Isabel Caprice, MD;  Location: [facility];  Service: Gynecology;  Laterality: Bilateral;  . TONSILLECTOMY    . TONSILLECTOMY AND ADENOIDECTOMY  1953    There were no vitals filed for this visit.  Subjective Assessment - 02/29/20 1110    Subjective   Doing well - still little dizzy at times - elbow doing well - I could pick up 1/2 gallon of milk out of the refrigarotar , and I like the wall pushups    Pertinent History  Pt fell 11/23/2019 and fracture her R elbow - minimal displaced radius head fx -  pt in sling if she is out and about - but refer to OT for ROM ,PROM and weight limited 1-2 lbs -    Patient Stated Goals  I would like the ROM , strength better so I can push up from chair and have no pain    Currently in Pain?  No/denies       Assess pt with picking up and carry 5 thru 6 lbs  - no compensating with body and no symptoms  But with 7 lbs - to heavy to lift - some discomfort - compensate with shoulder and body              OT Treatments/Exercises (OP) - 02/29/20 0001      Moist Heat Therapy   Number Minutes Moist Heat  8 Minutes    Moist  Heat Location  Elbow   Extention stretch at [facility]        pt had full extention after heating pad  Change pt to 2 lbs weight - but HEP to do in supine -and do punches up to celing - extending that elbow  And then shoulder 90 degrees flexion -and flex and extend elbow with 2 lbs weight  Attempted 3 lbs - but could not extend elbow end range   To do 1 set of 12 - increase in 3 days to 2 sets and 5-6 days if no issues or pain - 3 sets I   Cont with dark green firm to teal putty to HEP for grip  Can do1sets12-15 repsand increase to 2nd set in 3 days if pain free - to  get fingers into putty Pt says effort level is 8/10 still   Cont with Wall pushupdoing well with per pt - 3 sets of 10-12 pain free  Review RTB ( 4.5lbs) for scapula squeezes  And shoulder extention 12 reps Effort level 6/10 - pt can do 2 sets of 12 and add in 3 days 3 sets   Add this date elbow extention 12 reps - RTB at top of door - pt need cueing to keep elbow Adducted   2 sets of 12 and then increase to 3rd set       OT Education - 02/29/20 1112    Education Details  progress and HEP    Person(s) Educated  Patient    Methods  Explanation;Demonstration;Tactile cues;Verbal cues;Handout    Comprehension  Verbal cues required;Returned demonstration;Verbalized understanding       OT Short Term Goals - 02/21/20 1619      OT SHORT TERM GOAL #1   Title  Pt to be independent in HEP to increase L elbow extention to -5 and increase strenght to carry more than 5 lbs    Baseline  elbow extention AROM -5 and PROM and in session 0 - carry and lift 5 lbs with ease    Status  Achieved        OT Long Term Goals - 02/21/20 1619      OT LONG TERM GOAL #1   Title  L Elbow extention improve for pt to pull shirt over head, push up and carry more than 8 lbs without increase symptoms    Baseline  0 to -5 for extention , upgrade to 3 lbs for elbow HEP , no pain reported but can carry and lift 5 lbs - but more  compensate with body  has some discomfort    Time  4    Period  Weeks  Status  On-going    Target Date  03/20/20      OT LONG TERM GOAL #2   Title  Pt AROM and strength in L elbow improve to prior level for function    Baseline  can do wall pushups , dress , bath, - no pushing up out of bed or chair , groceries, laundery and fitting sheet still favor    Time  4    Period  Weeks    Status  On-going    Target Date  03/20/20            Plan - 02/29/20 1113    Clinical Impression Statement  Pt is 13 wks out from minimal Fx of radius head - showing increase strength in L arm - was able to pick up 6 lbs with ease this date - but 7 lbs she had some discomfort and compensation with shoulder to pick up    OT Occupational Profile and History  Problem Focused Assessment - Including review of records relating to presenting problem    Occupational performance deficits (Please refer to evaluation for details):  ADL's;IADL's;Play;Leisure;Social Participation    Body Structure / Function / Physical Skills  ADL;Flexibility;ROM;UE functional use;Pain;Strength;IADL    Rehab Potential  Good    Clinical Decision Making  Limited treatment options, no task modification necessary    Comorbidities Affecting Occupational Performance:  May have comorbidities impacting occupational performance    Modification or Assistance to Complete Evaluation   No modification of tasks or assist necessary to complete eval    OT Frequency  1x / week    OT Treatment/Interventions  Self-care/ADL training;Therapeutic exercise;Patient/family education;Cryotherapy;Paraffin;Moist Heat;Manual Therapy;Passive range of motion;Therapeutic activities    Plan  assess progress with HEP    OT Home Exercise Plan  see pt instruction    Consulted and Agree with Plan of Care  Patient       Patient will benefit from skilled therapeutic intervention in order to improve the following deficits and impairments:   Body Structure / Function /  Physical Skills: ADL, Flexibility, ROM, UE functional use, Pain, Strength, IADL       Visit Diagnosis: Stiffness of left elbow, not elsewhere classified  Muscle weakness (generalized)  Pain in left elbow    Problem List Patient Active Problem List   Diagnosis Date Noted  . Parkinson disease ([place]) 02/22/2020  . 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 Bipolar disorder, in full remission, most recent episode depressed ([place]) 11/08/2019  . Avitaminosis D 08/24/2019  . Insomnia due to mental condition 05/31/2019  . Bipolar I disorder, most recent episode depressed ([place]) 05/31/2019  . Mild cognitive disorder 05/31/2019  . Alcohol use disorder, moderate, in sustained remission ([place]) 05/31/2019  . Elevated tumor markers 05/27/2019  . GAD (generalized anxiety disorder) 04/10/2019  . Altered mental status 04/10/2019  . MDD (major depressive disorder), severe ([place]) 01/31/2019  . High serum carbohydrate antigen 19-9 (CA19-9) 12/01/2018  . Adjustment disorder with anxious mood 11/24/2018  . Essential hypertension 11/24/2018  . B12 deficiency 10/17/2018  . Incisional hernia, without obstruction or gangrene 09/12/2018  . Genetic testing 04/07/2018  . Family history of lung cancer 03/24/2018  . Malignant neoplasm of upper-outer quadrant of right breast in female, estrogen receptor positive ([place]) 03/17/2018  . Malignant neoplasm of upper-inner quadrant of right breast in female, estrogen receptor positive ([place]) 03/17/2018  . History of alcohol dependence ([place]) 02/07/2018  . History of psychosis 02/07/2018  . History of insomnia 02/07/2018  Rosalyn Gess OTR/l,CLT 02/29/2020, 11:16 AM  [facility] [address] [place], [address] Phone: [phone]   Fax:  [phone]  Name: Lanika Palley MRN: [id number] Date of Birth: [birth year]

## 2020-02-29 NOTE — Therapy (Signed)
[facility] [address] [place], [address] Phone: [phone]   Fax:  [phone]  Physical Therapy Treatment  Patient Details  Name: Kendra [name] MRN: [id number] Date of Birth: 1946/08/30 No data recorded  Encounter Date: 02/29/2020  PT End of Session - 02/29/20 0824    Visit Number  33    Number of Visits  38    Date for PT Re-Evaluation  03/19/20    Authorization Type  3/10    Authorization Time Period  March 2-May 18    PT Start Time  0816    PT Stop Time  0856    PT Time Calculation (min)  40 min    Activity Tolerance  Patient tolerated treatment well;No increased pain    Behavior During Therapy  WFL for tasks assessed/performed       Past Medical History:  Diagnosis Date  . Anxiety   . Breast cancer ([place]) 03/2018   right breast cancer at 11:00 and 1:00  . Bursitis    bilateral hips and knees  . Complication of anesthesia   . Depression   . Family history of breast cancer 03/24/2018  . Family history of lung cancer 03/24/2018  . History of alcohol dependence ([place]) 2007   no ETOH; resolved since 2007  . History of hiatal hernia   . History of psychosis 2014   due to sleep disturbance  . Hypertension   . Personal history of radiation therapy 06/2018-07/2018   right breast ca  . PONV (postoperative nausea and vomiting)     Past Surgical History:  Procedure Laterality Date  . BREAST BIOPSY Right 03/14/2018   11:00 DCIS and invasive ductal carcinoma  . BREAST BIOPSY Right 03/14/2018   1:00 Invasive ductal carcinoma  . BREAST LUMPECTOMY Right 04/27/2018   lumpectomy of 11 and 1:00 cancers, clear margins, negative LN  . BREAST LUMPECTOMY WITH RADIOACTIVE SEED AND SENTINEL LYMPH NODE BIOPSY Right 04/27/2018   Procedure: RIGHT BREAST LUMPECTOMY WITH RADIOACTIVE SEED X 2 AND RIGHT SENTINEL LYMPH NODE BIOPSY;  Surgeon: [name], MD;  Location: [facility];  Service:  General;  Laterality: Right;  . CATARACT EXTRACTION W/PHACO Left 08/30/2019   Procedure: CATARACT EXTRACTION PHACO AND INTRAOCULAR LENS PLACEMENT ([place]) LEFT panoptix toric  01:20.5  12.7%  10.26;  Surgeon: Leandrew Koyanagi, MD;  Location: [facility];  Service: Ophthalmology;  Laterality: Left;  . CATARACT EXTRACTION W/PHACO Right 09/20/2019   Procedure: CATARACT EXTRACTION PHACO AND INTRAOCULAR LENS PLACEMENT ([facility]) RIGHT 5.71  00:36.4  15.7%;  Surgeon: Leandrew Koyanagi, MD;  Location: [facility];  Service: Ophthalmology;  Laterality: Right;  . LAPAROTOMY N/A 02/10/2018   Procedure: EXPLORATORY LAPAROTOMY;  Surgeon: Isabel Caprice, MD;  Location: [facility];  Service: Gynecology;  Laterality: N/A;  . MASS EXCISION  01/2018   abdominal  . OVARIAN CYST REMOVAL    . SALPINGOOPHORECTOMY Bilateral 02/10/2018   Procedure: BILATERAL SALPINGO OOPHORECTOMY; PERITONEAL WASHINGS;  Surgeon: Isabel Caprice, MD;  [facility];  Service: Gynecology;  Laterality: Bilateral;  . TONSILLECTOMY    . TONSILLECTOMY AND ADENOIDECTOMY  1953    There were no vitals filed for this visit.  Subjective Assessment - 02/29/20 0820    Subjective  Pt reports she continues to have more perceived balance issues, reports she had a fall into the grass last week while leaning on a bin. She has no pain today because she is sitting down and she  has taken her 2 naproxen.    Pertinent History  Patient report unclear regarding onset. Visited MD on Sept 16 and received injection with relief for approximately 1 month. Reports she cannot receive an additional injection pending eye surgery. Received dx from MD of hip bursitis in B hips. Reports pain with walking, stanindg, sitting 5/10 current 7/10 worst 3/10 best, with temporary relief when she is sitting "just right" and with naproxen. Agg: activity. Cormorbidities: hx of breast cancer, HTN, MCI, insomnia, cataracts    Currently in Pain?  No/denies          PT Short Term Goals - 09/12/19 1419      PT SHORT TERM GOAL #1   Title  Patient will demonstrate complaince with HEP to speed recovery and reduce total number of visits.    Baseline  HEP given    Time  2    Period  Weeks    Status  New    Target Date  09/27/19        PT Long Term Goals - 01/29/20 1015      PT LONG TERM GOAL #1   Title  Patient will improve hip strength to 4/5 grossly to facilitate improved mobility with ADLs.    Baseline  3, 4-; 12/22 deferred to NV; 11/09/2019: 4-/5; 12/07/2019 L hip flexor 4, abd and ext 3+; 01/29/2020: 4/5 for most hip motions, 3+/5 for abd, ext    Time  8    Period  Weeks    Status  On-going      PT LONG TERM GOAL #2   Title  Patient will demonstrate walking tolerance increased to 30 minutes without increase in symptoms to demonstrate improved ability to perform walking program for cardiovascular health.    Baseline  10 minute tolerance (patient report); 12/22 10 minutes (per pt report); 11/09/2019: 7 min; 12/07/2019 unable to assess due to icy streets;  01/29/2020: 10 min    Time  8    Period  Weeks    Status  On-going      PT LONG TERM GOAL #3   Title  Patient will report worst pain of 3/10 during therapy to demonstrate reduced disability    Baseline  Worst 7/10; 12/22 worst 9/10; 11/09/2019: 9/10; 12/07/2019 7/10; 01/29/2020: 7/10    Time  8    Period  Weeks    Status  On-going      PT LONG TERM GOAL #4   Title  Patient will demonstrate ability to perform all bed mobility maneuvers without increase in pain for reduced disability with mobility and transfers with ADLs.    Baseline  Reports pain with bed mobility supine <> sidelying L/R, sidelying to sitting EOB; 12/22 pt able to perform I without increased pain    Time  8    Period  Weeks    Status  Achieved      PT LONG TERM GOAL #5   Title  Patient will improve 10MW speed to 1.0 m/sec to meet norms for minimal fall risk and full community ambulation.    Baseline  0.88 m/sec; 12/22 0.96  m/sec; 11/09/2019: deferred; 12/07/2019 0.90 m/sec; 01/29/2020: .9  m/sec    Time  8    Period  Weeks    Status  On-going      Additional Long Term Goals   Additional Long Term Goals  Yes      PT LONG TERM GOAL #6   Title  Patient will increase 6MWT by 200  ft to demonstrate improvement in functional capacity for transition to cardiac walking program    Baseline  6MWT = 1195; 1/7/20201: deferred; 12/07/2019 1095; 01/29/2020: deferred secondary to fatigue    Time  8    Period  Weeks    Status  Deferred      PT LONG TERM GOAL #7   Title  Patient will improve her 5xsts to under 15 sec to indicate singificnat improvement in LE functional strength and decrease in fall risk.    Baseline  24.5 sec    Time  6    Period  Weeks    Status  New      TREATMENT Therapeutic Exercise Side stepping with RTB - x 20 (BUE support) Sit to stands with RTB around knees - x 10  Hip extension in standing - x20 bilat  (verbal cues to slow down and control limb) Hip abduction in standing - x 20 (BUE support)  Hip abduction to extension - x20 (BUE support)  Side stepping up and over airex pad - x15 Normal (to slight narrow) Stance Airex with basket ball self toss/catch 1x20  SemiTandem Stance 5x10sec bilat    Performed exercises to address hip weakness and balance limitations  Plan - 02/29/20 [id number]    Clinical Impression Statement Pt able to complete entire session as planned with rest breaks provided as needed, typically 2-3 minutes between exercises. Pt maintains high level of focus and motivation. Minimal verbal, visual, and tactile cues are provided for most accurate form possible, pt typically moving too fast in an oscillatory fashion with poor isometric component at end range due to momentum. Author provides minA intermittently for full ROM when needed. Overall pt continues to make steady progress toward treatment goals.    Personal Factors and Comorbidities  Age;Comorbidity 3+;Time since onset of  injury/illness/exacerbation;Comorbidity 2    Comorbidities  MCI, HTN, CA, cataracts    Examination-Activity Limitations  Bend;Sit;Squat;Stairs;Stand;Sleep    Examination-Participation Restrictions  Cleaning;Community Activity    Stability/Clinical Decision Making  Evolving/Moderate complexity    Clinical Decision Making  Moderate    Rehab Potential  Fair    Clinical Impairments Affecting Rehab Potential  MCI    PT Frequency  2x / week    PT Duration  6 weeks    PT Treatment/Interventions  ADLs/Self Care Home Management;Therapeutic exercise;Patient/family education;Neuromuscular re-education;Therapeutic activities;Functional mobility training;Balance training;Manual techniques;Gait training;Stair training;Moist Heat;Electrical Stimulation;Cryotherapy;Taping;Dry needling;Energy conservation;Passive range of motion;Joint Manipulations    PT Next Visit Plan  Progress glute therex, pelvic-hip dissociation; check and progress HEP with stretches    PT Home Exercise Plan  lateral step ups, step ups, hip kickers ext/abd red theraband    Consulted and Agree with Plan of Care  Patient       Patient will benefit from skilled therapeutic intervention in order to improve the following deficits and impairments:  Decreased range of motion, Postural dysfunction, Pain, Abnormal gait, Decreased activity tolerance, Decreased coordination, Decreased endurance, Decreased balance, Decreased mobility, Difficulty walking, Hypomobility, Increased muscle spasms, Impaired perceived functional ability, Decreased strength, Impaired flexibility  Visit Diagnosis: Stiffness of left elbow, not elsewhere classified  Muscle weakness (generalized)  Difficulty in walking, not elsewhere classified  Pain in left hip  Pain in right hip     Problem List Patient Active Problem List   Diagnosis Date Noted  . Parkinson disease ([place]) 02/22/2020  . 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 Bipolar disorder, in full remission, most  recent episode depressed ([place]) 11/08/2019  . Avitaminosis D 08/24/2019  .  Insomnia due to mental condition 05/31/2019  . Bipolar I disorder, most recent episode depressed ([place]) 05/31/2019  . Mild cognitive disorder 05/31/2019  . Alcohol use disorder, moderate, in sustained remission ([employer]) 05/31/2019  . Elevated tumor markers 05/27/2019  . GAD (generalized anxiety disorder) 04/10/2019  . Altered mental status 04/10/2019  . MDD (major depressive disorder), severe ([place]) 01/31/2019  . High serum carbohydrate antigen 19-9 (CA19-9) 12/01/2018  . Adjustment disorder with anxious mood 11/24/2018  . Essential hypertension 11/24/2018  . B12 deficiency 10/17/2018  . Incisional hernia, without obstruction or gangrene 09/12/2018  . Genetic testing 04/07/2018  . Family history of lung cancer 03/24/2018  . Malignant neoplasm of upper-outer quadrant of right breast in female, estrogen receptor positive ([place]) 03/17/2018  . Malignant neoplasm of upper-inner quadrant of right breast in female, estrogen receptor positive ([place]) 03/17/2018  . History of alcohol dependence ([place]) 02/07/2018  . History of psychosis 02/07/2018  . History of insomnia 02/07/2018   8:39 AM, 02/29/20 Etta Grandchild, PT, DPT Physical Therapist - [place] [phone] (Office)    Churchill Grimsley C 02/29/2020, 8:35 AM  [facility] [address] [place], [address] Phone: [phone]   Fax:  [phone]  Name: Kendra [name] MRN: [id number] Date of Birth: [birth year]

## 2020-03-07 ENCOUNTER — Ambulatory Visit: Payer: Medicare PPO | Admitting: Occupational Therapy

## 2020-03-07 ENCOUNTER — Ambulatory Visit: Payer: Medicare PPO | Attending: Family Medicine

## 2020-03-07 ENCOUNTER — Other Ambulatory Visit: Payer: Self-pay

## 2020-03-07 DIAGNOSIS — M25551 Pain in right hip: Secondary | ICD-10-CM | POA: Insufficient documentation

## 2020-03-07 DIAGNOSIS — M6281 Muscle weakness (generalized): Secondary | ICD-10-CM

## 2020-03-07 DIAGNOSIS — M25522 Pain in left elbow: Secondary | ICD-10-CM

## 2020-03-07 DIAGNOSIS — R262 Difficulty in walking, not elsewhere classified: Secondary | ICD-10-CM | POA: Insufficient documentation

## 2020-03-07 DIAGNOSIS — M25622 Stiffness of left elbow, not elsewhere classified: Secondary | ICD-10-CM

## 2020-03-07 DIAGNOSIS — M25552 Pain in left hip: Secondary | ICD-10-CM | POA: Diagnosis not present

## 2020-03-07 NOTE — Patient Instructions (Signed)
See note

## 2020-03-07 NOTE — Therapy (Signed)
[facility] [address], [place], [address] Phone: [phone]   Fax:  [phone]  Physical Therapy Treatment  Patient Details  Name: Kendra [name] MRN: [id number] Date of Birth: 1945-12-16 No data recorded  Encounter Date: 03/07/2020  PT End of Session - 03/07/20 1404    Visit Number  34    Number of Visits  38    Date for PT Re-Evaluation  03/19/20    Authorization Type  4/10    Authorization Time Period  March 2-May 18    PT Start Time  1345    PT Stop Time  1430    PT Time Calculation (min)  45 min    Activity Tolerance  Patient tolerated treatment well;No increased pain    Behavior During Therapy  WFL for tasks assessed/performed       Past Medical History:  Diagnosis Date  . Anxiety   . Breast cancer ([place]) 03/2018   right breast cancer at 11:00 and 1:00  . Bursitis    bilateral hips and knees  . Complication of anesthesia   . Depression   . Family history of breast cancer 03/24/2018  . Family history of lung cancer 03/24/2018  . History of alcohol dependence ([place]) 2007   no ETOH; resolved since 2007  . History of hiatal hernia   . History of psychosis 2014   due to sleep disturbance  . Hypertension   . Personal history of radiation therapy 06/2018-07/2018   right breast ca  . PONV (postoperative nausea and vomiting)     Past Surgical History:  Procedure Laterality Date  . BREAST BIOPSY Right 03/14/2018   11:00 DCIS and invasive ductal carcinoma  . BREAST BIOPSY Right 03/14/2018   1:00 Invasive ductal carcinoma  . BREAST LUMPECTOMY Right 04/27/2018   lumpectomy of 11 and 1:00 cancers, clear margins, negative LN  . BREAST LUMPECTOMY WITH RADIOACTIVE SEED AND SENTINEL LYMPH NODE BIOPSY Right 04/27/2018   Procedure: RIGHT BREAST LUMPECTOMY WITH RADIOACTIVE SEED X 2 AND RIGHT SENTINEL LYMPH NODE BIOPSY;  Surgeon: [name], MD;  Location: [facility];  Service:  General;  Laterality: Right;  . CATARACT EXTRACTION W/PHACO Left 08/30/2019   Procedure: CATARACT EXTRACTION PHACO AND INTRAOCULAR LENS PLACEMENT ([place]) LEFT panoptix toric  01:20.5  12.7%  10.26;  Surgeon: Leandrew Koyanagi, MD;  Location: [facility];  Service: Ophthalmology;  Laterality: Left;  . CATARACT EXTRACTION W/PHACO Right 09/20/2019   Procedure: CATARACT EXTRACTION PHACO AND INTRAOCULAR LENS PLACEMENT ([facility]) RIGHT 5.71  00:36.4  15.7%;  Surgeon: Leandrew Koyanagi, MD;  Location: [facility];  Service: Ophthalmology;  Laterality: Right;  . LAPAROTOMY N/A 02/10/2018   Procedure: EXPLORATORY LAPAROTOMY;  Surgeon: Isabel Caprice, MD;  [facility];  Service: Gynecology;  Laterality: N/A;  . MASS EXCISION  01/2018   abdominal  . OVARIAN CYST REMOVAL    . SALPINGOOPHORECTOMY Bilateral 02/10/2018   Procedure: BILATERAL SALPINGO OOPHORECTOMY; PERITONEAL WASHINGS;  Surgeon: Isabel Caprice, MD;  [facility];  Service: Gynecology;  Laterality: Bilateral;  . TONSILLECTOMY    . TONSILLECTOMY AND ADENOIDECTOMY  1953    There were no vitals filed for this visit.  Subjective Assessment - 03/07/20 1353    Subjective  Patient demonstrates increased shakiness and increased overall fatigue along the hip and low back.    Pertinent History  Patient report unclear regarding onset. Visited MD on Sept 16 and received injection with relief for approximately  1 month. Reports she cannot receive an additional injection pending eye surgery. Received dx from MD of hip bursitis in B hips. Reports pain with walking, stanindg, sitting 5/10 current 7/10 worst 3/10 best, with temporary relief when she is sitting "just right" and with naproxen. Agg: activity. Cormorbidities: hx of breast cancer, HTN, MCI, insomnia, cataracts    Patient Stated Goals  Be able to move without hurting, walk 1 hour    Currently in Pain?  No/denies         TREATMENT Therapeutic Exercise Side stepping with  Therapist UE support - 2 x 71ft Over top steps in standings (front part of a karaoke) - 2 x 52ft Four square stepping - x 20 calling out square positions Hip abduction in standing - x 20 Backward walking - 6 x 67ft Hip extension in standing - x 20  Forward walking with use of agility ladder with reciprocal UE movements - 6 x 67ft Side stepping with overhead movement - x20  Performed exercises to improve balance and hip strength in standing   PT Short Term Goals - 09/12/19 1419      PT SHORT TERM GOAL #1   Title  Patient will demonstrate complaince with HEP to speed recovery and reduce total number of visits.    Baseline  HEP given    Time  2    Period  Weeks    Status  New    Target Date  09/27/19        PT Long Term Goals - 01/29/20 1015      PT LONG TERM GOAL #1   Title  Patient will improve hip strength to 4/5 grossly to facilitate improved mobility with ADLs.    Baseline  3, 4-; 12/22 deferred to NV; 11/09/2019: 4-/5; 12/07/2019 L hip flexor 4, abd and ext 3+; 01/29/2020: 4/5 for most hip motions, 3+/5 for abd, ext    Time  8    Period  Weeks    Status  On-going      PT LONG TERM GOAL #2   Title  Patient will demonstrate walking tolerance increased to 30 minutes without increase in symptoms to demonstrate improved ability to perform walking program for cardiovascular health.    Baseline  10 minute tolerance (patient report); 12/22 10 minutes (per pt report); 11/09/2019: 7 min; 12/07/2019 unable to assess due to icy streets;  01/29/2020: 10 min    Time  8    Period  Weeks    Status  On-going      PT LONG TERM GOAL #3   Title  Patient will report worst pain of 3/10 during therapy to demonstrate reduced disability    Baseline  Worst 7/10; 12/22 worst 9/10; 11/09/2019: 9/10; 12/07/2019 7/10; 01/29/2020: 7/10    Time  8    Period  Weeks    Status  On-going      PT LONG TERM GOAL #4   Title  Patient will demonstrate ability to perform all bed mobility maneuvers without increase in  pain for reduced disability with mobility and transfers with ADLs.    Baseline  Reports pain with bed mobility supine <> sidelying L/R, sidelying to sitting EOB; 12/22 pt able to perform I without increased pain    Time  8    Period  Weeks    Status  Achieved      PT LONG TERM GOAL #5   Title  Patient will improve 10MW speed to 1.0 m/sec to meet norms for  minimal fall risk and full community ambulation.    Baseline  0.88 m/sec; 12/22 0.96 m/sec; 11/09/2019: deferred; 12/07/2019 0.90 m/sec; 01/29/2020: .9  m/sec    Time  8    Period  Weeks    Status  On-going      Additional Long Term Goals   Additional Long Term Goals  Yes      PT LONG TERM GOAL #6   Title  Patient will increase 6MWT by 200 ft to demonstrate improvement in functional capacity for transition to cardiac walking program    Baseline  6MWT = 1195; 1/7/20201: deferred; 12/07/2019 1095; 01/29/2020: deferred secondary to fatigue    Time  8    Period  Weeks    Status  Deferred      PT LONG TERM GOAL #7   Title  Patient will improve her 5xsts to under 15 sec to indicate singificnat improvement in LE functional strength and decrease in fall risk.    Baseline  24.5 sec    Time  6    Period  Weeks    Status  New            Plan - 03/07/20 1441    Clinical Impression Statement  Continued to focus on improving hip strength and balance by incorporating UE movements with walking and stepping motions. Also continued to focus improving hip extension and abduction motions. Patient will benefit from further skileld therapy to return to prior level of function.    Personal Factors and Comorbidities  Age;Comorbidity 3+;Time since onset of injury/illness/exacerbation;Comorbidity 2    Comorbidities  MCI, HTN, CA, cataracts    Examination-Activity Limitations  Bend;Sit;Squat;Stairs;Stand;Sleep    Examination-Participation Restrictions  Cleaning;Community Activity    Stability/Clinical Decision Making  Evolving/Moderate complexity    Rehab  Potential  Fair    Clinical Impairments Affecting Rehab Potential  MCI    PT Frequency  2x / week    PT Duration  6 weeks    PT Treatment/Interventions  ADLs/Self Care Home Management;Therapeutic exercise;Patient/family education;Neuromuscular re-education;Therapeutic activities;Functional mobility training;Balance training;Manual techniques;Gait training;Stair training;Moist Heat;Electrical Stimulation;Cryotherapy;Taping;Dry needling;Energy conservation;Passive range of motion;Joint Manipulations    PT Next Visit Plan  Progress glute therex, pelvic-hip dissociation; check and progress HEP with stretches    PT Home Exercise Plan  lateral step ups, step ups, hip kickers ext/abd red theraband    Consulted and Agree with Plan of Care  Patient       Patient will benefit from skilled therapeutic intervention in order to improve the following deficits and impairments:  Decreased range of motion, Postural dysfunction, Pain, Abnormal gait, Decreased activity tolerance, Decreased coordination, Decreased endurance, Decreased balance, Decreased mobility, Difficulty walking, Hypomobility, Increased muscle spasms, Impaired perceived functional ability, Decreased strength, Impaired flexibility  Visit Diagnosis: Muscle weakness (generalized)  Difficulty in walking, not elsewhere classified  Pain in left hip  Pain in right hip     Problem List Patient Active Problem List   Diagnosis Date Noted  . Parkinson disease ([place]) 02/22/2020  . 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 Bipolar disorder, in full remission, most recent episode depressed ([place]) 11/08/2019  . Avitaminosis D 08/24/2019  . Insomnia due to mental condition 05/31/2019  . Bipolar I disorder, most recent episode depressed ([place]) 05/31/2019  . Mild cognitive disorder 05/31/2019  . Alcohol use disorder, moderate, in sustained remission ([place]) 05/31/2019  . Elevated tumor markers 05/27/2019  . GAD (generalized anxiety disorder) 04/10/2019  .  Altered mental status 04/10/2019  . MDD (major depressive disorder),  severe ([place]) 01/31/2019  . High serum carbohydrate antigen 19-9 (CA19-9) 12/01/2018  . Adjustment disorder with anxious mood 11/24/2018  . Essential hypertension 11/24/2018  . B12 deficiency 10/17/2018  . Incisional hernia, without obstruction or gangrene 09/12/2018  . Genetic testing 04/07/2018  . Family history of lung cancer 03/24/2018  . Malignant neoplasm of upper-outer quadrant of right breast in female, estrogen receptor positive ([place]) 03/17/2018  . Malignant neoplasm of upper-inner quadrant of right breast in female, estrogen receptor positive ([place]) 03/17/2018  . History of alcohol dependence ([place]) 02/07/2018  . History of psychosis 02/07/2018  . History of insomnia 02/07/2018    Blythe Stanford, PT DPT 03/07/2020, 2:50 PM  [facility] [address], [place], [address] Phone: [phone]   Fax:  [phone]  Name: Kendra [name] MRN: [id number] Date of Birth: 19-Jul-1946

## 2020-03-07 NOTE — Therapy (Signed)
[facility] [address] [place], [address] Phone: [phone]   Fax:  [phone]  Occupational Therapy Treatment  Patient Details  Name: Kendra [name] MRN: [id number] Date of Birth: 03-07-1946 Referring Provider (OT): Vance Peper Date: 03/07/2020  OT End of Session - 03/07/20 1342    Visit Number  8    Number of Visits  10    Date for OT Re-Evaluation  03/20/20    OT Start Time  1300    OT Stop Time  1345    OT Time Calculation (min)  45 min    Activity Tolerance  Patient tolerated treatment well    Behavior During Therapy  [facility] for tasks assessed/performed       Past Medical History:  Diagnosis Date  . Anxiety   . Breast cancer ([place]) 03/2018   right breast cancer at 11:00 and 1:00  . Bursitis    bilateral hips and knees  . Complication of anesthesia   . Depression   . Family history of breast cancer 03/24/2018  . Family history of lung cancer 03/24/2018  . History of alcohol dependence ([place]) 2007   no ETOH; resolved since 2007  . History of hiatal hernia   . History of psychosis 2014   due to sleep disturbance  . Hypertension   . Personal history of radiation therapy 06/2018-07/2018   right breast ca  . PONV (postoperative nausea and vomiting)     Past Surgical History:  Procedure Laterality Date  . BREAST BIOPSY Right 03/14/2018   11:00 DCIS and invasive ductal carcinoma  . BREAST BIOPSY Right 03/14/2018   1:00 Invasive ductal carcinoma  . BREAST LUMPECTOMY Right 04/27/2018   lumpectomy of 11 and 1:00 cancers, clear margins, negative LN  . BREAST LUMPECTOMY WITH RADIOACTIVE SEED AND SENTINEL LYMPH NODE BIOPSY Right 04/27/2018   Procedure: RIGHT BREAST LUMPECTOMY WITH RADIOACTIVE SEED X 2 AND RIGHT SENTINEL LYMPH NODE BIOPSY;  Surgeon: [name], MD;  Location: [facility];  Service: General;  Laterality: Right;  . CATARACT EXTRACTION W/PHACO Left  08/30/2019   Procedure: CATARACT EXTRACTION PHACO AND INTRAOCULAR LENS PLACEMENT ([place]) LEFT panoptix toric  01:20.5  12.7%  10.26;  Surgeon: Leandrew Koyanagi, MD;  Location: [facility];  Service: Ophthalmology;  Laterality: Left;  . CATARACT EXTRACTION W/PHACO Right 09/20/2019   Procedure: CATARACT EXTRACTION PHACO AND INTRAOCULAR LENS PLACEMENT ([facility]) RIGHT 5.71  00:36.4  15.7%;  Surgeon: Leandrew Koyanagi, MD;  Location: [facility];  Service: Ophthalmology;  Laterality: Right;  . LAPAROTOMY N/A 02/10/2018   Procedure: EXPLORATORY LAPAROTOMY;  Surgeon: Isabel Caprice, MD;  Location: [facility];  Service: Gynecology;  Laterality: N/A;  . MASS EXCISION  01/2018   abdominal  . OVARIAN CYST REMOVAL    . SALPINGOOPHORECTOMY Bilateral 02/10/2018   Procedure: BILATERAL SALPINGO OOPHORECTOMY; PERITONEAL WASHINGS;  Surgeon: Isabel Caprice, MD;  Location: [facility];  Service: Gynecology;  Laterality: Bilateral;  . TONSILLECTOMY    . TONSILLECTOMY AND ADENOIDECTOMY  1953    There were no vitals filed for this visit.  Subjective Assessment - 03/07/20 1341    Subjective   DOing okay -I just still dizzy - it is part of the parkinsons's - worse in the am - done my exercises like you told me - could not walk as much with the dizziness    Pertinent History  Pt fell 11/23/2019 and fracture her R elbow - minimal displaced  radius head fx - pt in sling if she is out and about - but refer to OT for ROM ,PROM and weight limited 1-2 lbs -    Patient Stated Goals  I would like the ROM , strength better so I can push up from chair and have no pain    Currently in Pain?  No/denies         [facility] OT Assessment - 03/07/20 0001      Strength   Left Hand Grip (lbs)  27       Elbow AROM compare to R - same - extention all planes for elbow  Strength for flexion and extention 4/5      Change last time pt to 2 lbs weight - but HEP to do in supine -and do punches up to ceiling - extending that  elbow  And then shoulder 90 degrees flexion -and flex and extend elbow with 2 lbs weight  Attempted 3 lbs - but could not extend elbow end range   To do 2 set of 12 - increase in 4-5 days to 3 sets in 5-6 days if no issues or pain   Cont with dark green firm toteal putty to HEP for grip  Can do1sets12-15 repsand increase to 2nd set in 3 days if pain free - toget fingers into putty Pt says effort level is 8/10 still - grip did not increase will assess next time  Cont withWall pushupdoing well with per pt - 3 sets of 10-12pain free Pt to keep hands on shoulder height  Upgrade to Green TB ( 5lbs) for scapula squeezes  And shoulder extention 12 reps Effort level 7/10 - pt can do 1 sets of 12 and add in 3 days 2 sets ;6-7 days 3 sets if no issues   Elbow extention 12 reps - greenTB at top of door - pt need cueing to keep elbow Adducted   1sets of 12 and then increase to 3rd set  Same as above     Nustep R=3 - 7 min using arms more than feet            OT Education - 03/07/20 1342    Education Details  progress and HEP changes    Person(s) Educated  Patient    Methods  Explanation;Demonstration;Tactile cues;Verbal cues;Handout    Comprehension  Verbal cues required;Returned demonstration;Verbalized understanding       OT Short Term Goals - 02/21/20 1619      OT SHORT TERM GOAL #1   Title  Pt to be independent in HEP to increase L elbow extention to -5 and increase strenght to carry more than 5 lbs    Baseline  elbow extention AROM -5 and PROM and in session 0 - carry and lift 5 lbs with ease    Status  Achieved        OT Long Term Goals - 02/21/20 1619      OT LONG TERM GOAL #1   Title  L Elbow extention improve for pt to pull shirt over head, push up and carry more than 8 lbs without increase symptoms    Baseline  0 to -5 for extention , upgrade to 3 lbs for elbow HEP , no pain reported but can carry and lift 5 lbs - but more compensate with body   has some discomfort    Time  4    Period  Weeks    Status  On-going    Target Date  03/20/20      OT LONG TERM GOAL #2   Title  Pt AROM and strength in L elbow improve to prior level for function    Baseline  can do wall pushups , dress , bath, - no pushing up out of bed or chair , groceries, laundery and fitting sheet still favor    Time  4    Period  Weeks    Status  On-going    Target Date  03/20/20            Plan - 03/07/20 1343    Clinical Impression Statement  Pt is 14 wks out from mininal Fx of radius head - showing progress in strength in L elbow and arm- able to carry and lift 7 lbs this week - no pain -and able to upgrade to green theraband for HEP    OT Occupational Profile and History  Problem Focused Assessment - Including review of records relating to presenting problem    Occupational performance deficits (Please refer to evaluation for details):  ADL's;IADL's;Play;Leisure;Social Participation    Body Structure / Function / Physical Skills  ADL;Flexibility;ROM;UE functional use;Pain;Strength;IADL    Rehab Potential  Good    Clinical Decision Making  Limited treatment options, no task modification necessary    Comorbidities Affecting Occupational Performance:  May have comorbidities impacting occupational performance    Modification or Assistance to Complete Evaluation   No modification of tasks or assist necessary to complete eval    OT Frequency  1x / week    OT Duration  4 weeks    OT Treatment/Interventions  Self-care/ADL training;Therapeutic exercise;Patient/family education;Cryotherapy;Paraffin;Moist Heat;Manual Therapy;Passive range of motion;Therapeutic activities    Plan  assess progress with HEP    OT Home Exercise Plan  see pt instruction    Consulted and Agree with Plan of Care  Patient       Patient will benefit from skilled therapeutic intervention in order to improve the following deficits and impairments:   Body Structure / Function / Physical  Skills: ADL, Flexibility, ROM, UE functional use, Pain, Strength, IADL       Visit Diagnosis: Stiffness of left elbow, not elsewhere classified  Muscle weakness (generalized)  Pain in left elbow    Problem List Patient Active Problem List   Diagnosis Date Noted  . Parkinson disease ([place]) 02/22/2020  . 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 Bipolar disorder, in full remission, most recent episode depressed ([place]) 11/08/2019  . Avitaminosis D 08/24/2019  . Insomnia due to mental condition 05/31/2019  . Bipolar I disorder, most recent episode depressed ([place]) 05/31/2019  . Mild cognitive disorder 05/31/2019  . Alcohol use disorder, moderate, in sustained remission ([place]) 05/31/2019  . Elevated tumor markers 05/27/2019  . GAD (generalized anxiety disorder) 04/10/2019  . Altered mental status 04/10/2019  . MDD (major depressive disorder), severe ([place]) 01/31/2019  . High serum carbohydrate antigen 19-9 (CA19-9) 12/01/2018  . Adjustment disorder with anxious mood 11/24/2018  . Essential hypertension 11/24/2018  . B12 deficiency 10/17/2018  . Incisional hernia, without obstruction or gangrene 09/12/2018  . Genetic testing 04/07/2018  . Family history of lung cancer 03/24/2018  . Malignant neoplasm of upper-outer quadrant of right breast in female, estrogen receptor positive ([place]) 03/17/2018  . Malignant neoplasm of upper-inner quadrant of right breast in female, estrogen receptor positive ([place]) 03/17/2018  . History of alcohol dependence ([place]) 02/07/2018  . History of psychosis 02/07/2018  . History of insomnia 02/07/2018    Rosalyn Gess OTR/L,CLT 03/07/2020,  4:00 PM  [facility] [address] [place], [address] Phone: [phone]   Fax:  [phone]  Name: Kendra [name] MRN: [id number] Date of Birth: April 13, 1946

## 2020-03-11 ENCOUNTER — Other Ambulatory Visit: Payer: Self-pay

## 2020-03-11 ENCOUNTER — Ambulatory Visit: Payer: Medicare PPO

## 2020-03-11 DIAGNOSIS — M6281 Muscle weakness (generalized): Secondary | ICD-10-CM

## 2020-03-11 DIAGNOSIS — M25522 Pain in left elbow: Secondary | ICD-10-CM | POA: Diagnosis not present

## 2020-03-11 DIAGNOSIS — R262 Difficulty in walking, not elsewhere classified: Secondary | ICD-10-CM

## 2020-03-11 DIAGNOSIS — M25551 Pain in right hip: Secondary | ICD-10-CM | POA: Diagnosis not present

## 2020-03-11 DIAGNOSIS — M25622 Stiffness of left elbow, not elsewhere classified: Secondary | ICD-10-CM | POA: Diagnosis not present

## 2020-03-11 DIAGNOSIS — M25552 Pain in left hip: Secondary | ICD-10-CM | POA: Diagnosis not present

## 2020-03-12 NOTE — Therapy (Signed)
[facility] [address] [place], [address] Phone: [phone]   Fax:  [phone]  Physical Therapy Treatment  Patient Details  Name: Kendra [name] MRN: [id number] Date of Birth: February 08, 1946 No data recorded  Encounter Date: 03/11/2020  PT End of Session - 03/11/20 1753    Visit Number  35    Number of Visits  38    Date for PT Re-Evaluation  03/19/20    Authorization Type  5/10    Authorization Time Period  March 2-May 18    PT Start Time  1430    PT Stop Time  1515    PT Time Calculation (min)  45 min    Activity Tolerance  Patient tolerated treatment well;No increased pain    Behavior During Therapy  [facility] for tasks assessed/performed       Past Medical History:  Diagnosis Date  . Anxiety   . Breast cancer ([place]) 03/2018   right breast cancer at 11:00 and 1:00  . Bursitis    bilateral hips and knees  . Complication of anesthesia   . Depression   . Family history of breast cancer 03/24/2018  . Family history of lung cancer 03/24/2018  . History of alcohol dependence ([place]) 2007   no ETOH; resolved since 2007  . History of hiatal hernia   . History of psychosis 2014   due to sleep disturbance  . Hypertension   . Personal history of radiation therapy 06/2018-07/2018   right breast ca  . PONV (postoperative nausea and vomiting)     Past Surgical History:  Procedure Laterality Date  . BREAST BIOPSY Right 03/14/2018   11:00 DCIS and invasive ductal carcinoma  . BREAST BIOPSY Right 03/14/2018   1:00 Invasive ductal carcinoma  . BREAST LUMPECTOMY Right 04/27/2018   lumpectomy of 11 and 1:00 cancers, clear margins, negative LN  . BREAST LUMPECTOMY WITH RADIOACTIVE SEED AND SENTINEL LYMPH NODE BIOPSY Right 04/27/2018   Procedure: RIGHT BREAST LUMPECTOMY WITH RADIOACTIVE SEED X 2 AND RIGHT SENTINEL LYMPH NODE BIOPSY;  Surgeon: [name], MD;  Location: [facility];  Service:  General;  Laterality: Right;  . CATARACT EXTRACTION W/PHACO Left 08/30/2019   Procedure: CATARACT EXTRACTION PHACO AND INTRAOCULAR LENS PLACEMENT ([place]) LEFT panoptix toric  01:20.5  12.7%  10.26;  Surgeon: Leandrew Koyanagi, MD;  Location: [facility];  Service: Ophthalmology;  Laterality: Left;  . CATARACT EXTRACTION W/PHACO Right 09/20/2019   Procedure: CATARACT EXTRACTION PHACO AND INTRAOCULAR LENS PLACEMENT ([facility]) RIGHT 5.71  00:36.4  15.7%;  Surgeon: Leandrew Koyanagi, MD;  Location: [facility];  Service: Ophthalmology;  Laterality: Right;  . LAPAROTOMY N/A 02/10/2018   Procedure: EXPLORATORY LAPAROTOMY;  Surgeon: Isabel Caprice, MD;  [facility];  Service: Gynecology;  Laterality: N/A;  . MASS EXCISION  01/2018   abdominal  . OVARIAN CYST REMOVAL    . SALPINGOOPHORECTOMY Bilateral 02/10/2018   Procedure: BILATERAL SALPINGO OOPHORECTOMY; PERITONEAL WASHINGS;  Surgeon: Isabel Caprice, MD;  [facility];  Service: Gynecology;  Laterality: Bilateral;  . TONSILLECTOMY    . TONSILLECTOMY AND ADENOIDECTOMY  1953    There were no vitals filed for this visit.  Subjective Assessment - 03/11/20 1435    Subjective  Patient reports she has been feeling dizzy when walking and performing step ups. Patient states her dizziness    Pertinent History  Patient report unclear regarding onset. Visited MD on Sept 16 and received injection with  relief for approximately 1 month. Reports she cannot receive an additional injection pending eye surgery. Received dx from MD of hip bursitis in B hips. Reports pain with walking, stanindg, sitting 5/10 current 7/10 worst 3/10 best, with temporary relief when she is sitting "just right" and with naproxen. Agg: activity. Cormorbidities: hx of breast cancer, HTN, MCI, insomnia, cataracts    Patient Stated Goals  Be able to move without hurting, walk 1 hour    Currently in Pain?  No/denies          TREATMENT Therapeutic  Exercise Forward step arms overhead with clapping - x 10 B Backward step arms overhead - x 10 B with clapping Rocking forward with contralateral UE overhead - x 10 with clapping VOR in sitting - x 10 B laterally VOR in standing - x 10 B laterally VOR walking side - x 10 laterally  VOR cancelling in sitting - x 5 no increase in symptoms Walking head turns up/down; laterally - 4 x 59ft  Performed exercises to improve hip strength and coordination overall   PT Education - 03/11/20 1433    Education provided  Yes    Education Details  form/technique with exercise    Person(s) Educated  Patient    Methods  Explanation;Demonstration    Comprehension  Verbalized understanding;Returned demonstration       PT Short Term Goals - 09/12/19 1419      PT SHORT TERM GOAL #1   Title  Patient will demonstrate complaince with HEP to speed recovery and reduce total number of visits.    Baseline  HEP given    Time  2    Period  Weeks    Status  New    Target Date  09/27/19        PT Long Term Goals - 01/29/20 1015      PT LONG TERM GOAL #1   Title  Patient will improve hip strength to 4/5 grossly to facilitate improved mobility with ADLs.    Baseline  3, 4-; 12/22 deferred to NV; 11/09/2019: 4-/5; 12/07/2019 L hip flexor 4, abd and ext 3+; 01/29/2020: 4/5 for most hip motions, 3+/5 for abd, ext    Time  8    Period  Weeks    Status  On-going      PT LONG TERM GOAL #2   Title  Patient will demonstrate walking tolerance increased to 30 minutes without increase in symptoms to demonstrate improved ability to perform walking program for cardiovascular health.    Baseline  10 minute tolerance (patient report); 12/22 10 minutes (per pt report); 11/09/2019: 7 min; 12/07/2019 unable to assess due to icy streets;  01/29/2020: 10 min    Time  8    Period  Weeks    Status  On-going      PT LONG TERM GOAL #3   Title  Patient will report worst pain of 3/10 during therapy to demonstrate reduced disability     Baseline  Worst 7/10; 12/22 worst 9/10; 11/09/2019: 9/10; 12/07/2019 7/10; 01/29/2020: 7/10    Time  8    Period  Weeks    Status  On-going      PT LONG TERM GOAL #4   Title  Patient will demonstrate ability to perform all bed mobility maneuvers without increase in pain for reduced disability with mobility and transfers with ADLs.    Baseline  Reports pain with bed mobility supine <> sidelying L/R, sidelying to sitting EOB; 12/22 pt able to  perform I without increased pain    Time  8    Period  Weeks    Status  Achieved      PT LONG TERM GOAL #5   Title  Patient will improve 10MW speed to 1.0 m/sec to meet norms for minimal fall risk and full community ambulation.    Baseline  0.88 m/sec; 12/22 0.96 m/sec; 11/09/2019: deferred; 12/07/2019 0.90 m/sec; 01/29/2020: .9  m/sec    Time  8    Period  Weeks    Status  On-going      Additional Long Term Goals   Additional Long Term Goals  Yes      PT LONG TERM GOAL #6   Title  Patient will increase 6MWT by 200 ft to demonstrate improvement in functional capacity for transition to cardiac walking program    Baseline  6MWT = 1195; 1/7/20201: deferred; 12/07/2019 1095; 01/29/2020: deferred secondary to fatigue    Time  8    Period  Weeks    Status  Deferred      PT LONG TERM GOAL #7   Title  Patient will improve her 5xsts to under 15 sec to indicate singificnat improvement in LE functional strength and decrease in fall risk.    Baseline  24.5 sec    Time  6    Period  Weeks    Status  New            Plan - 03/12/20 1101    Clinical Impression Statement  Increased onset of dizziness with performing head turns laterally and VOR laterally indicating poor stabilization of vestibuloccular system. Continued to focus on improving the performance of large dramatic movements to counteract the affects of Parkinson's dz with performing hip movements to help improve LE strengthening. Patient tolerates these well however has difficulty with coordinating the  movement. Patient will benfit from further skilled therapy to return to prior level off fucntion.    Personal Factors and Comorbidities  Age;Comorbidity 3+;Time since onset of injury/illness/exacerbation;Comorbidity 2    Comorbidities  MCI, HTN, CA, cataracts    Examination-Activity Limitations  Bend;Sit;Squat;Stairs;Stand;Sleep    Examination-Participation Restrictions  Cleaning;Community Activity    Stability/Clinical Decision Making  Evolving/Moderate complexity    Rehab Potential  Fair    Clinical Impairments Affecting Rehab Potential  MCI    PT Frequency  2x / week    PT Duration  6 weeks    PT Treatment/Interventions  ADLs/Self Care Home Management;Therapeutic exercise;Patient/family education;Neuromuscular re-education;Therapeutic activities;Functional mobility training;Balance training;Manual techniques;Gait training;Stair training;Moist Heat;Electrical Stimulation;Cryotherapy;Taping;Dry needling;Energy conservation;Passive range of motion;Joint Manipulations    PT Next Visit Plan  Progress glute therex, pelvic-hip dissociation; check and progress HEP with stretches    PT Home Exercise Plan  lateral step ups, step ups, hip kickers ext/abd red theraband    Consulted and Agree with Plan of Care  Patient       Patient will benefit from skilled therapeutic intervention in order to improve the following deficits and impairments:  Decreased range of motion, Postural dysfunction, Pain, Abnormal gait, Decreased activity tolerance, Decreased coordination, Decreased endurance, Decreased balance, Decreased mobility, Difficulty walking, Hypomobility, Increased muscle spasms, Impaired perceived functional ability, Decreased strength, Impaired flexibility  Visit Diagnosis: Muscle weakness (generalized)  Difficulty in walking, not elsewhere classified     Problem List Patient Active Problem List   Diagnosis Date Noted  . Parkinson disease ([place]) 02/22/2020  . 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   . Bipolar disorder, in full remission, most recent episode depressed ([place])  11/08/2019  . Avitaminosis D 08/24/2019  . Insomnia due to mental condition 05/31/2019  . Bipolar I disorder, most recent episode depressed ([place]) 05/31/2019  . Mild cognitive disorder 05/31/2019  . Alcohol use disorder, moderate, in sustained remission ([place]) 05/31/2019  . Elevated tumor markers 05/27/2019  . GAD (generalized anxiety disorder) 04/10/2019  . Altered mental status 04/10/2019  . MDD (major depressive disorder), severe ([place]) 01/31/2019  . High serum carbohydrate antigen 19-9 (CA19-9) 12/01/2018  . Adjustment disorder with anxious mood 11/24/2018  . Essential hypertension 11/24/2018  . B12 deficiency 10/17/2018  . Incisional hernia, without obstruction or gangrene 09/12/2018  . Genetic testing 04/07/2018  . Family history of lung cancer 03/24/2018  . Malignant neoplasm of upper-outer quadrant of right breast in female, estrogen receptor positive ([place]) 03/17/2018  . Malignant neoplasm of upper-inner quadrant of right breast in female, estrogen receptor positive ([place]) 03/17/2018  . History of alcohol dependence ([place]) 02/07/2018  . History of psychosis 02/07/2018  . History of insomnia 02/07/2018    Blythe Stanford, PT DPT 03/12/2020, 11:05 AM  [facility] [address] [place], [address] Phone: [phone]   Fax:  [phone]  Name: Kendra [name] MRN: [id number] Date of Birth: Aug 07, 1946

## 2020-03-13 ENCOUNTER — Telehealth (INDEPENDENT_AMBULATORY_CARE_PROVIDER_SITE_OTHER): Payer: Medicare PPO | Admitting: Psychiatry

## 2020-03-13 ENCOUNTER — Encounter: Payer: Self-pay | Admitting: Psychiatry

## 2020-03-13 ENCOUNTER — Ambulatory Visit: Payer: Medicare PPO

## 2020-03-13 ENCOUNTER — Other Ambulatory Visit: Payer: Self-pay

## 2020-03-13 DIAGNOSIS — F3176 Bipolar disorder, in full remission, most recent episode depressed: Secondary | ICD-10-CM

## 2020-03-13 DIAGNOSIS — F5105 Insomnia due to other mental disorder: Secondary | ICD-10-CM | POA: Diagnosis not present

## 2020-03-13 DIAGNOSIS — M25522 Pain in left elbow: Secondary | ICD-10-CM | POA: Diagnosis not present

## 2020-03-13 DIAGNOSIS — F411 Generalized anxiety disorder: Secondary | ICD-10-CM | POA: Diagnosis not present

## 2020-03-13 DIAGNOSIS — R262 Difficulty in walking, not elsewhere classified: Secondary | ICD-10-CM | POA: Diagnosis not present

## 2020-03-13 DIAGNOSIS — M25552 Pain in left hip: Secondary | ICD-10-CM | POA: Diagnosis not present

## 2020-03-13 DIAGNOSIS — M6281 Muscle weakness (generalized): Secondary | ICD-10-CM

## 2020-03-13 DIAGNOSIS — M25622 Stiffness of left elbow, not elsewhere classified: Secondary | ICD-10-CM | POA: Diagnosis not present

## 2020-03-13 DIAGNOSIS — F09 Unspecified mental disorder due to known physiological condition: Secondary | ICD-10-CM

## 2020-03-13 DIAGNOSIS — M25551 Pain in right hip: Secondary | ICD-10-CM | POA: Diagnosis not present

## 2020-03-13 MED ORDER — RISPERIDONE 2 MG PO TABS: 1.0000 mg | ORAL_TABLET | Freq: Two times a day (BID) | ORAL | 1 refills | Status: DC

## 2020-03-13 NOTE — Therapy (Signed)
[facility] [address], [place], [address] Phone: [phone]   Fax:  [phone]  Physical Therapy Treatment  Patient Details  Name: Kendra [name] MRN: [id number] Date of Birth: 09/03/1946 No data recorded  Encounter Date: 03/13/2020  PT End of Session - 03/13/20 1445    Visit Number  36    Number of Visits  38    Date for PT Re-Evaluation  03/19/20    Authorization Type  6/10    Authorization Time Period  March 2-May 18    PT Start Time  1345    PT Stop Time  1430    PT Time Calculation (min)  45 min    Activity Tolerance  Patient tolerated treatment well;No increased pain    Behavior During Therapy  WFL for tasks assessed/performed       Past Medical History:  Diagnosis Date  . Anxiety   . Breast cancer ([place]) 03/2018   right breast cancer at 11:00 and 1:00  . Bursitis    bilateral hips and knees  . Complication of anesthesia   . Depression   . Family history of breast cancer 03/24/2018  . Family history of lung cancer 03/24/2018  . History of alcohol dependence ([place]) 2007   no ETOH; resolved since 2007  . History of hiatal hernia   . History of psychosis 2014   due to sleep disturbance  . Hypertension   . Personal history of radiation therapy 06/2018-07/2018   right breast ca  . PONV (postoperative nausea and vomiting)     Past Surgical History:  Procedure Laterality Date  . BREAST BIOPSY Right 03/14/2018   11:00 DCIS and invasive ductal carcinoma  . BREAST BIOPSY Right 03/14/2018   1:00 Invasive ductal carcinoma  . BREAST LUMPECTOMY Right 04/27/2018   lumpectomy of 11 and 1:00 cancers, clear margins, negative LN  . BREAST LUMPECTOMY WITH RADIOACTIVE SEED AND SENTINEL LYMPH NODE BIOPSY Right 04/27/2018   Procedure: RIGHT BREAST LUMPECTOMY WITH RADIOACTIVE SEED X 2 AND RIGHT SENTINEL LYMPH NODE BIOPSY;  Surgeon: [name], MD;  Location: [facility];  Service:  General;  Laterality: Right;  . CATARACT EXTRACTION W/PHACO Left 08/30/2019   Procedure: CATARACT EXTRACTION PHACO AND INTRAOCULAR LENS PLACEMENT ([place]) LEFT panoptix toric  01:20.5  12.7%  10.26;  Surgeon: Leandrew Koyanagi, MD;  Location: [facility];  Service: Ophthalmology;  Laterality: Left;  . CATARACT EXTRACTION W/PHACO Right 09/20/2019   Procedure: CATARACT EXTRACTION PHACO AND INTRAOCULAR LENS PLACEMENT ([facility]) RIGHT 5.71  00:36.4  15.7%;  Surgeon: Leandrew Koyanagi, MD;  Location: [facility];  Service: Ophthalmology;  Laterality: Right;  . LAPAROTOMY N/A 02/10/2018   Procedure: EXPLORATORY LAPAROTOMY;  Surgeon: Isabel Caprice, MD;  Location: [facility];  Service: Gynecology;  Laterality: N/A;  . MASS EXCISION  01/2018   abdominal  . OVARIAN CYST REMOVAL    . SALPINGOOPHORECTOMY Bilateral 02/10/2018   Procedure: BILATERAL SALPINGO OOPHORECTOMY; PERITONEAL WASHINGS;  Surgeon: Isabel Caprice, MD;  Location: [facility];  Service: Gynecology;  Laterality: Bilateral;  . TONSILLECTOMY    . TONSILLECTOMY AND ADENOIDECTOMY  1953    There were no vitals filed for this visit.  Subjective Assessment - 03/13/20 1401    Subjective  Patient reports she has been feeling the same level of dizziness as the previous session.    Pertinent History  Patient report unclear regarding onset. Visited MD on Sept 16 and received injection with relief for  approximately 1 month. Reports she cannot receive an additional injection pending eye surgery. Received dx from MD of hip bursitis in B hips. Reports pain with walking, stanindg, sitting 5/10 current 7/10 worst 3/10 best, with temporary relief when she is sitting "just right" and with naproxen. Agg: activity. Cormorbidities: hx of breast cancer, HTN, MCI, insomnia, cataracts    Patient Stated Goals  Be able to move without hurting, walk 1 hour    Currently in Pain?  No/denies            TREATMENT Therapeutic Exercise Forward step arms  overhead with clapping - x 10 B Seated rotational stand ups with unilateral arm forward/backward -x10 4 -way seated reaches forward, down, up, and backward - x 10  Leaning forward and reaching with overhead movement - x 10  Side stepping with reaching reaching arms outside of BOS - x 10 B Rocking forward with contralateral UE overhead - x 10 with clapping   Performed exercises to improve hip strength and coordination overall     PT Education - 03/13/20 1445    Education provided  Yes    Education Details  form/technique with exercise    Person(s) Educated  Patient    Methods  Explanation;Demonstration    Comprehension  Verbalized understanding;Returned demonstration       PT Short Term Goals - 09/12/19 1419      PT SHORT TERM GOAL #1   Title  Patient will demonstrate complaince with HEP to speed recovery and reduce total number of visits.    Baseline  HEP given    Time  2    Period  Weeks    Status  New    Target Date  09/27/19        PT Long Term Goals - 01/29/20 1015      PT LONG TERM GOAL #1   Title  Patient will improve hip strength to 4/5 grossly to facilitate improved mobility with ADLs.    Baseline  3, 4-; 12/22 deferred to NV; 11/09/2019: 4-/5; 12/07/2019 L hip flexor 4, abd and ext 3+; 01/29/2020: 4/5 for most hip motions, 3+/5 for abd, ext    Time  8    Period  Weeks    Status  On-going      PT LONG TERM GOAL #2   Title  Patient will demonstrate walking tolerance increased to 30 minutes without increase in symptoms to demonstrate improved ability to perform walking program for cardiovascular health.    Baseline  10 minute tolerance (patient report); 12/22 10 minutes (per pt report); 11/09/2019: 7 min; 12/07/2019 unable to assess due to icy streets;  01/29/2020: 10 min    Time  8    Period  Weeks    Status  On-going      PT LONG TERM GOAL #3   Title  Patient will report worst pain of 3/10 during therapy to demonstrate reduced disability    Baseline  Worst 7/10; 12/22  worst 9/10; 11/09/2019: 9/10; 12/07/2019 7/10; 01/29/2020: 7/10    Time  8    Period  Weeks    Status  On-going      PT LONG TERM GOAL #4   Title  Patient will demonstrate ability to perform all bed mobility maneuvers without increase in pain for reduced disability with mobility and transfers with ADLs.    Baseline  Reports pain with bed mobility supine <> sidelying L/R, sidelying to sitting EOB; 12/22 pt able to perform I without increased pain  Time  8    Period  Weeks    Status  Achieved      PT LONG TERM GOAL #5   Title  Patient will improve 10MW speed to 1.0 m/sec to meet norms for minimal fall risk and full community ambulation.    Baseline  0.88 m/sec; 12/22 0.96 m/sec; 11/09/2019: deferred; 12/07/2019 0.90 m/sec; 01/29/2020: .9  m/sec    Time  8    Period  Weeks    Status  On-going      Additional Long Term Goals   Additional Long Term Goals  Yes      PT LONG TERM GOAL #6   Title  Patient will increase 6MWT by 200 ft to demonstrate improvement in functional capacity for transition to cardiac walking program    Baseline  6MWT = 1195; 1/7/20201: deferred; 12/07/2019 1095; 01/29/2020: deferred secondary to fatigue    Time  8    Period  Weeks    Status  Deferred      PT LONG TERM GOAL #7   Title  Patient will improve her 5xsts to under 15 sec to indicate singificnat improvement in LE functional strength and decrease in fall risk.    Baseline  24.5 sec    Time  6    Period  Weeks    Status  New            Plan - 03/13/20 1446    Clinical Impression Statement  Performed exercises that extended into large ranges of motion during todays session to both challenge her balancing system, work on improving global AROM and improve mental processing ability of different body parts. Patient requires frequent verbal and tactile cues to perform with proper form and technique; she will often confuse the appropriate movements with a contralateral side indicating poor motor control. Patient will  benefit from further skilled therapy to reutnr to prior level of function.    Personal Factors and Comorbidities  Age;Comorbidity 3+;Time since onset of injury/illness/exacerbation;Comorbidity 2    Comorbidities  MCI, HTN, CA, cataracts    Examination-Activity Limitations  Bend;Sit;Squat;Stairs;Stand;Sleep    Examination-Participation Restrictions  Cleaning;Community Activity    Stability/Clinical Decision Making  Evolving/Moderate complexity    Rehab Potential  Fair    Clinical Impairments Affecting Rehab Potential  MCI    PT Frequency  2x / week    PT Duration  6 weeks    PT Treatment/Interventions  ADLs/Self Care Home Management;Therapeutic exercise;Patient/family education;Neuromuscular re-education;Therapeutic activities;Functional mobility training;Balance training;Manual techniques;Gait training;Stair training;Moist Heat;Electrical Stimulation;Cryotherapy;Taping;Dry needling;Energy conservation;Passive range of motion;Joint Manipulations    PT Next Visit Plan  Progress glute therex, pelvic-hip dissociation; check and progress HEP with stretches    PT Home Exercise Plan  lateral step ups, step ups, hip kickers ext/abd red theraband    Consulted and Agree with Plan of Care  Patient       Patient will benefit from skilled therapeutic intervention in order to improve the following deficits and impairments:  Decreased range of motion, Postural dysfunction, Pain, Abnormal gait, Decreased activity tolerance, Decreased coordination, Decreased endurance, Decreased balance, Decreased mobility, Difficulty walking, Hypomobility, Increased muscle spasms, Impaired perceived functional ability, Decreased strength, Impaired flexibility  Visit Diagnosis: Muscle weakness (generalized)  Difficulty in walking, not elsewhere classified     Problem List Patient Active Problem List   Diagnosis Date Noted  . Parkinson disease ([facility]) 02/22/2020  . 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 Bipolar disorder, in  full remission, most recent episode depressed ([place] [facility]) 11/08/2019  .  Avitaminosis D 08/24/2019  . Insomnia due to mental condition 05/31/2019  . Bipolar I disorder, most recent episode depressed ([place]) 05/31/2019  . Mild cognitive disorder 05/31/2019  . Alcohol use disorder, moderate, in sustained remission ([place] [facility]) 05/31/2019  . Elevated tumor markers 05/27/2019  . GAD (generalized anxiety disorder) 04/10/2019  . Altered mental status 04/10/2019  . MDD (major depressive disorder), severe ([place]) 01/31/2019  . High serum carbohydrate antigen 19-9 (CA19-9) 12/01/2018  . Adjustment disorder with anxious mood 11/24/2018  . Essential hypertension 11/24/2018  . B12 deficiency 10/17/2018  . Incisional hernia, without obstruction or gangrene 09/12/2018  . Genetic testing 04/07/2018  . Family history of lung cancer 03/24/2018  . Malignant neoplasm of upper-outer quadrant of right breast in female, estrogen receptor positive ([facility]) 03/17/2018  . Malignant neoplasm of upper-inner quadrant of right breast in female, estrogen receptor positive ([place]) 03/17/2018  . History of alcohol dependence ([place]) 02/07/2018  . History of psychosis 02/07/2018  . History of insomnia 02/07/2018    Blythe Stanford, PT DPT 03/13/2020, 2:52 PM  [facility] [address] [place], [address] Phone: [phone]   Fax:  [phone]  Name: Gianni Connel MRN: [id number] Date of Birth: [birth year]

## 2020-03-13 NOTE — Progress Notes (Signed)
Provider Location : [facility] Patient Location : Home  Virtual Visit via Telephone Note  I connected with Kendra [name] on 03/13/20 at  4:00 PM EDT by telephone and verified that I am speaking with the correct person using two identifiers.   I discussed the limitations, risks, security and privacy concerns of performing an evaluation and management service by telephone and the availability of in person appointments. I also discussed with the patient that there may be a patient responsible charge related to this service. The patient expressed understanding and agreed to proceed.    I discussed the assessment and treatment plan with the patient. The patient was provided an opportunity to ask questions and all were answered. The patient agreed with the plan and demonstrated an understanding of the instructions.   The patient was advised to call back or seek an in-person evaluation if the symptoms worsen or if the condition fails to improve as anticipated.  [facility] MD OP Progress Note  03/13/2020 5:08 PM Kendra [name]  MRN:  [id number]  Chief Complaint:  Chief Complaint    Follow-up     HPI: Kendra [name] is a 74 year old Caucasian female, married, lives in [place], history of bipolar, GAD, insomnia, cognitive disorder, multifocal stage I right breast cancer status post treatment-lumpectomy, ovarian cyst removal was evaluated by phone today.  Patient preferred to do a phone call.  Patient today reports she is currently doing well with regards to her mood symptoms.  She does have the stressor of being diagnosed with primary Parkinson's disease recently.  She is under the care of neurology.  She reports she is currently on carbidopa levodopa.  She reports she has noticed some urinary discoloration as well as odour and she wonders whether the medication is causing it or not.  She continues to struggle with gait problems, dizziness and so on and is currently in physical therapy.  She will continue to  follow-up with neurology.  Patient reports mood symptoms are stable.  She denies any significant mood swings, irritability.  She denies any sadness or crying spells.  She reports sleep as good.  She reports appetite is fair.  Patient denies any suicidality, homicidality or perceptual disturbances.  Collateral information was obtained from Mr. Ralph Leyden.  Discussed with spouse that since patient has been stable her risperidone can be gradually tapered down to a lower dosage.  This may benefit her since she currently has a diagnosis of Parkinson's disease and medications like risperidone can cause parkinsonian side effects.  Husband agrees to monitors patient closely.      Visit Diagnosis:    ICD-10-CM   1. Bipolar disorder, in full remission, most recent episode depressed ([place])  F31.76 risperiDONE (RISPERDAL) 2 MG tablet  2. GAD (generalized anxiety disorder)  F41.1   3. Insomnia due to mental condition  F51.05   4. Mild cognitive disorder  F09     Past Psychiatric History: I have reviewed past psychiatric history from my progress note on 03/01/2019.  Past trials of Prozac, risperidone, Seroquel, Ativan.  Past Medical History:  Past Medical History:  Diagnosis Date  . Anxiety   . Breast cancer ([place]) 03/2018   right breast cancer at 11:00 and 1:00  . Bursitis    bilateral hips and knees  . Complication of anesthesia   . Depression   . Family history of breast cancer 03/24/2018  . Family history of lung cancer 03/24/2018  . History of alcohol dependence ([place]) 2007   no ETOH; resolved since  2007  . History of hiatal hernia   . History of psychosis 2014   due to sleep disturbance  . Hypertension   . Personal history of radiation therapy 06/2018-07/2018   right breast ca  . PONV (postoperative nausea and vomiting)     Past Surgical History:  Procedure Laterality Date  . BREAST BIOPSY Right 03/14/2018   11:00 DCIS and invasive ductal carcinoma  . BREAST BIOPSY  Right 03/14/2018   1:00 Invasive ductal carcinoma  . BREAST LUMPECTOMY Right 04/27/2018   lumpectomy of 11 and 1:00 cancers, clear margins, negative LN  . BREAST LUMPECTOMY WITH RADIOACTIVE SEED AND SENTINEL LYMPH NODE BIOPSY Right 04/27/2018   Procedure: RIGHT BREAST LUMPECTOMY WITH RADIOACTIVE SEED X 2 AND RIGHT SENTINEL LYMPH NODE BIOPSY;  Surgeon: [name], MD;  Location: [facility];  Service: General;  Laterality: Right;  . CATARACT EXTRACTION W/PHACO Left 08/30/2019   Procedure: CATARACT EXTRACTION PHACO AND INTRAOCULAR LENS PLACEMENT ([place]) LEFT panoptix toric  01:20.5  12.7%  10.26;  Surgeon: Leandrew Koyanagi, MD;  Location: [facility];  Service: Ophthalmology;  Laterality: Left;  . CATARACT EXTRACTION W/PHACO Right 09/20/2019   Procedure: CATARACT EXTRACTION PHACO AND INTRAOCULAR LENS PLACEMENT ([facility]) RIGHT 5.71  00:36.4  15.7%;  Surgeon: Leandrew Koyanagi, MD;  Location: [facility];  Service: Ophthalmology;  Laterality: Right;  . LAPAROTOMY N/A 02/10/2018   Procedure: EXPLORATORY LAPAROTOMY;  Surgeon: Isabel Caprice, MD;  Location: [facility];  Service: Gynecology;  Laterality: N/A;  . MASS EXCISION  01/2018   abdominal  . OVARIAN CYST REMOVAL    . SALPINGOOPHORECTOMY Bilateral 02/10/2018   Procedure: BILATERAL SALPINGO OOPHORECTOMY; PERITONEAL WASHINGS;  Surgeon: Isabel Caprice, MD;  Location: [facility];  Service: Gynecology;  Laterality: Bilateral;  . TONSILLECTOMY    . TONSILLECTOMY AND ADENOIDECTOMY  1953    Family Psychiatric History: I have reviewed family psychiatric history from my progress note on 03/01/2019  Family History:  Family History  Problem Relation Age of Onset  . Diabetes Mother   . Breast cancer Mother 32  . Hypertension Mother   . Lung cancer Father        asbestos exposure  . Heart disease Brother 40  . Breast cancer Cousin 70       paternal cousin  . Heart disease Paternal Grandmother 52  . Heart disease  Paternal Grandfather     Social History: I have reviewed social history from my progress note on 03/01/2019 Social History   Socioeconomic History  . Marital status: Married    Spouse name: robert  . Number of children: 0  . Years of education: Not on file  . Highest education level: Some college, no degree  Occupational History  . Occupation: retired Furniture conservator/restorer of education  Tobacco Use  . Smoking status: Never Smoker  . Smokeless tobacco: Never Used  Substance and Sexual Activity  . Alcohol use: Not Currently    Comment: alcohol dependence prior to 2007  . Drug use: Never  . Sexual activity: Yes    Partners: Male    Birth control/protection: Surgical  Other Topics Concern  . Not on file  Social History Narrative  . Not on file   Social Determinants of Health   Financial Resource Strain:   . Difficulty of Paying Living Expenses:   Food Insecurity:   . Worried About Charity fundraiser in the Last Year:   . [place] in the Last Year:  Transportation Needs:   . Film/video editor (Medical):   Marland Kitchen Lack of Transportation (Non-Medical):   Physical Activity:   . Days of Exercise per Week:   . Minutes of Exercise per Session:   Stress:   . Feeling of Stress :   Social Connections:   . Frequency of Communication with Friends and Family:   . Frequency of Social Gatherings with Friends and Family:   . Attends Religious Services:   . Active Member of Clubs or Organizations:   . Attends Archivist Meetings:   Marland Kitchen Marital Status:     Allergies:  Allergies  Allergen Reactions  . Hydroxyzine Anaphylaxis    Tongue swollen  . Shrimp [Shellfish Allergy] Other (See Comments)    Food poisoning     Metabolic Disorder Labs: Lab Results  Component Value Date   HGBA1C 5.4 04/13/2019   Lab Results  Component Value Date   PROLACTIN 101.9 06/20/2015   Lab Results  Component Value Date   CHOL 173 04/13/2019   TRIG 78 04/13/2019   HDL  62 04/13/2019   LDLCALC 95 04/13/2019   LDLCALC 103 06/02/2017   Lab Results  Component Value Date   TSH 2.31 04/13/2019   TSH 2.543 07/25/2018    Therapeutic Level Labs: No results found for: LITHIUM No results found for: VALPROATE No components found for:  CBMZ  Current Medications: Current Outpatient Medications  Medication Sig Dispense Refill  . Acetaminophen 500 MG capsule     . calcium citrate-vitamin D (CITRACAL+D) 315-200 MG-UNIT tablet Take 4 tablets by mouth daily.    . carbidopa-levodopa (SINEMET IR) 25-100 MG tablet Take 0.5 tablets by mouth in the morning, at noon, and at bedtime.    . cyanocobalamin (,VITAMIN B-12,) 1000 MCG/ML injection Inject 1 mL (1,000 mcg total) into the muscle every 30 (thirty) days. 3 mL 3  . FLUoxetine HCl 60 MG TABS Take 60 mg by mouth daily. 90 tablet 1  . ketoconazole (NIZORAL) 2 % cream Apply 1 application topically daily. 15 g 0  . letrozole (FEMARA) 2.5 MG tablet Take 1 tablet (2.5 mg total) by mouth daily. 90 tablet 3  . lisinopril-hydrochlorothiazide (ZESTORETIC) 20-12.5 MG tablet Take 1 tablet by mouth daily. 90 tablet 3  . polyethylene glycol (MIRALAX / GLYCOLAX) 17 g packet Take 17 g by mouth daily.    . risperiDONE (RISPERDAL) 2 MG tablet Take 0.5 tablets (1 mg total) by mouth 2 (two) times daily. 90 tablet 1  . traZODone (DESYREL) 50 MG tablet Take 0.5-1 tablets (25-50 mg total) by mouth at bedtime as needed for sleep. 90 tablet 1   Current Facility-Administered Medications  Medication Dose Route Frequency Provider Last Rate Last Admin  . lidocaine (PF) (XYLOCAINE) 1 % injection 4 mL  4 mL Intradermal Once Virginia Crews, MD      . lidocaine (PF) (XYLOCAINE) 1 % injection 4 mL  4 mL Intradermal Once Virginia Crews, MD      . methylPREDNISolone acetate (DEPO-MEDROL) injection 40 mg  40 mg Intramuscular Once Virginia Crews, MD      . methylPREDNISolone acetate (DEPO-MEDROL) injection 40 mg  40 mg Intramuscular Once  Brita Romp Dionne Bucy, MD         Musculoskeletal: Strength & Muscle Tone: Kendra [name]: UTA Patient leans: N/A  Psychiatric Specialty Exam: Review of Systems  Neurological: Positive for dizziness and tremors.  Psychiatric/Behavioral: Negative for agitation, behavioral problems, confusion, decreased concentration, dysphoric mood, hallucinations, self-injury, sleep  disturbance and suicidal ideas. The patient is not nervous/anxious and is not hyperactive.   All other systems reviewed and are negative.   There were no vitals taken for this visit.There is no height or weight on file to calculate BMI.  General Appearance: UTA  Eye Contact:  UTA  Speech:  Normal Rate  Volume:  Normal  Mood:  Euthymic  Affect:  UTA  Thought Process:  Goal Directed and Descriptions of Associations: Intact  Orientation:  Full (Time, Place, and Person)  Thought Content: Logical   Suicidal Thoughts:  No  Homicidal Thoughts:  No  Memory:  Immediate;   Fair Recent;   Fair Remote;   Fair  Judgement:  Fair  Insight:  Fair  Psychomotor Activity:  UTA  Concentration:  Concentration: Fair and Attention Span: Fair  Recall:  [employer] of Knowledge: Fair  Language: Fair  Akathisia:  No  Handed:  Right  AIMS (if indicated): UTA  Assets:  Communication Skills Desire for Improvement Housing Social Support  ADL's:  Intact  Cognition: WNL  Sleep:  Fair   Screenings: PHQ2-9     Office Visit from 11/24/2018 in [facility]  PHQ-2 Total Score  0  PHQ-9 Total Score  3       Assessment and Plan:Kendra [name] is a 74 year old Caucasian female, married, lives in [place], has a history of bipolar disorder, GAD, insomnia, multiple medical problems, recent diagnosis of primary Parkinson's disease, recent falls was evaluated by phone today.  Patient is biologically predisposed given her multiple health issues.  Patient with psychosocial stressors of recent diagnosis of primary Parkinson's  disease ,current  pandemic.  Patient however continues to be doing well with regards to her mood and is coping okay.  Since she is on risperidone which does have parkinsonian side effects, discussed reducing the dosage to a lower dose of 2 mg.  Patient as well as husband agrees with plan.  Plan as noted below.  Plan Bipolar disorder in remission Will reduce risperidone to 2 mg p.o. daily in divided dosage. Prozac 60 mg p.o. daily  GAD-stable Continue CBT as needed Continue Prozac as prescribed  Insomnia-stable Trazodone 25 to 50 mg p.o. nightly as needed Patient continues to be in physical therapy sessions for her pain. Patient also continues to take melatonin as needed at bedtime for sleep.  For cognitive changes that she will continue to follow-up with neurology.  I have reviewed medical records in [facility] per Dr. Manuella Ghazi, neurology dated 02/12/2020 -'patient with primary parkinsonism-right hemibody with resting tremor, bradykinesia.  Ambulatory physical therapy is already been completed at this time.  We will start Sinemet 25/100 mg p.o. daily. Reviewed MRI results.  Slums completed on 06/12/2019-22 out of 30.'  Collateral information was obtained from husband Mr. Miaja Norcott as summarized above.  Patient is interested in continuing CBT-patient encouraged to establish care with new therapist.  I will also send communication to staff here.  Follow-up in clinic in 4 weeks or sooner if needed.  I have spent atleast 20 minutes non face to face with patient today. More than 50 % of the time was spent for preparing to see the patient ( e.g., review of test, records ), obtaining and to review and separately obtained history , ordering medications and test ,psychoeducation and supportive psychotherapy and care coordination,as well as documenting clinical information in electronic health record. This note was generated in part or whole with voice recognition software. Voice recognition is usually  quite  accurate but there are transcription errors that can and very often do occur. I apologize for any typographical errors that were not detected and corrected.        Ursula Alert, MD 03/13/2020, 5:08 PM

## 2020-03-13 NOTE — Patient Instructions (Addendum)
We will reduce risperidone to 2 mg by mouth daily.  You can cut the 2 mg in to 1/2 tablets and take half tablet in the morning and half tablet at bedtime.  Since you are coming off of the risperidone to a lower dosage please monitor yourself for the following symptoms:  1. Mood swings, irritability, increased anxiety, racing thoughts.  2.  Sleep problems  3.  Worsening sadness, lack of motivation, low energy  If you notice any worsening mood symptoms or sleep problems, please call and ask for help.

## 2020-03-18 ENCOUNTER — Ambulatory Visit: Payer: Medicare PPO | Admitting: Occupational Therapy

## 2020-03-18 ENCOUNTER — Encounter: Payer: Self-pay | Admitting: Family Medicine

## 2020-03-18 ENCOUNTER — Ambulatory Visit: Payer: Medicare PPO | Admitting: Family Medicine

## 2020-03-18 ENCOUNTER — Other Ambulatory Visit: Payer: Self-pay

## 2020-03-18 VITALS — BP 115/70 | HR 86 | Temp 96.2°F | Wt 200.0 lb

## 2020-03-18 DIAGNOSIS — N3090 Cystitis, unspecified without hematuria: Secondary | ICD-10-CM | POA: Diagnosis not present

## 2020-03-18 LAB — POCT URINALYSIS DIPSTICK
Bilirubin, UA: NEGATIVE
Blood, UA: NEGATIVE
Glucose, UA: NEGATIVE
Ketones, UA: NEGATIVE
Nitrite, UA: POSITIVE
Protein, UA: NEGATIVE
Spec Grav, UA: 1.025 (ref 1.010–1.025)
Urobilinogen, UA: 0.2 E.U./dL
pH, UA: 7.5 (ref 5.0–8.0)

## 2020-03-18 MED ORDER — CEPHALEXIN 500 MG PO CAPS
500.0000 mg | ORAL_CAPSULE | Freq: Three times a day (TID) | ORAL | 0 refills | Status: DC
Start: 2020-03-18 — End: 2020-08-29

## 2020-03-18 NOTE — Patient Instructions (Signed)
Urinary Tract Infection, Adult A urinary tract infection (UTI) is an infection of any part of the urinary tract. The urinary tract includes:  The kidneys.  The ureters.  The bladder.  The urethra. These organs make, store, and get rid of pee (urine) in the body. What are the causes? This is caused by germs (bacteria) in your genital area. These germs grow and cause swelling (inflammation) of your urinary tract. What increases the risk? You are more likely to develop this condition if:  You have a small, thin tube (catheter) to drain pee.  You cannot control when you pee or poop (incontinence).  You are female, and: ? You use these methods to prevent pregnancy:  A medicine that kills sperm (spermicide).  A device that blocks sperm (diaphragm). ? You have low levels of a female hormone (estrogen). ? You are pregnant.  You have genes that add to your risk.  You are sexually active.  You take antibiotic medicines.  You have trouble peeing because of: ? A prostate that is bigger than normal, if you are female. ? A blockage in the part of your body that drains pee from the bladder (urethra). ? A kidney stone. ? A nerve condition that affects your bladder (neurogenic bladder). ? Not getting enough to drink. ? Not peeing often enough.  You have other conditions, such as: ? Diabetes. ? A weak disease-fighting system (immune system). ? Sickle cell disease. ? Gout. ? Injury of the spine. What are the signs or symptoms? Symptoms of this condition include:  Needing to pee right away (urgently).  Peeing often.  Peeing small amounts often.  Pain or burning when peeing.  Blood in the pee.  Pee that smells bad or not like normal.  Trouble peeing.  Pee that is cloudy.  Fluid coming from the vagina, if you are female.  Pain in the belly or lower back. Other symptoms include:  Throwing up (vomiting).  No urge to eat.  Feeling mixed up (confused).  Being tired  and grouchy (irritable).  A fever.  Watery poop (diarrhea). How is this treated? This condition may be treated with:  Antibiotic medicine.  Other medicines.  Drinking enough water. Follow these instructions at home:  Medicines  Take over-the-counter and prescription medicines only as told by your doctor.  If you were prescribed an antibiotic medicine, take it as told by your doctor. Do not stop taking it even if you start to feel better. General instructions  Make sure you: ? Pee until your bladder is empty. ? Do not hold pee for a long time. ? Empty your bladder after sex. ? Wipe from front to back after pooping if you are a female. Use each tissue one time when you wipe.  Drink enough fluid to keep your pee pale yellow.  Keep all follow-up visits as told by your doctor. This is important. Contact a doctor if:  You do not get better after 1-2 days.  Your symptoms go away and then come back. Get help right away if:  You have very bad back pain.  You have very bad pain in your lower belly.  You have a fever.  You are sick to your stomach (nauseous).  You are throwing up. Summary  A urinary tract infection (UTI) is an infection of any part of the urinary tract.  This condition is caused by germs in your genital area.  There are many risk factors for a UTI. These include having a small, thin   tube to drain pee and not being able to control when you pee or poop.  Treatment includes antibiotic medicines for germs.  Drink enough fluid to keep your pee pale yellow. This information is not intended to replace advice given to you by your health care provider. Make sure you discuss any questions you have with your health care provider. Document Revised: 10/06/2018 Document Reviewed: 04/28/2018 Elsevier Patient Education  2020 [employer]  

## 2020-03-18 NOTE — Progress Notes (Signed)
I,Laura E Walsh,acting as a scribe for Lavon Paganini, MD.,have documented all relevant documentation on the behalf of Lavon Paganini, MD,as directed by  Lavon Paganini, MD while in the presence of Lavon Paganini, MD.  Established patient visit   Patient: Kendra [name]   DOB: [birth year]   74 y.o. Female  MRN: [id number] Visit Date: 03/18/2020  Today's healthcare provider: Lavon Paganini, MD   Chief Complaint  Patient presents with  . Urinary Tract Infection   Subjective    Urinary Tract Infection  This is a new problem. The current episode started in the past 7 days. The problem has been gradually worsening. There has been no fever. Associated symptoms include frequency and urgency. Pertinent negatives include no discharge, flank pain or hematuria. She has tried nothing for the symptoms. The treatment provided no relief.    Patient Active Problem List   Diagnosis Date Noted  . Parkinson disease ([place]) 02/22/2020  . 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 Bipolar disorder, in full remission, most recent episode depressed ([place]) 11/08/2019  . Avitaminosis D 08/24/2019  . Insomnia due to mental condition 05/31/2019  . Bipolar I disorder, most recent episode depressed ([place]) 05/31/2019  . Mild cognitive disorder 05/31/2019  . Alcohol use disorder, moderate, in sustained remission ([place]) 05/31/2019  . Elevated tumor markers 05/27/2019  . GAD (generalized anxiety disorder) 04/10/2019  . Altered mental status 04/10/2019  . MDD (major depressive disorder), severe ([place]) 01/31/2019  . High serum carbohydrate antigen 19-9 (CA19-9) 12/01/2018  . Adjustment disorder with anxious mood 11/24/2018  . Essential hypertension 11/24/2018  . B12 deficiency 10/17/2018  . Incisional hernia, without obstruction or gangrene 09/12/2018  . Genetic testing 04/07/2018  . Family history of lung cancer 03/24/2018  . Malignant neoplasm of upper-outer quadrant of right breast in female, estrogen  receptor positive ([place]) 03/17/2018  . Malignant neoplasm of upper-inner quadrant of right breast in female, estrogen receptor positive ([place]) 03/17/2018  . History of alcohol dependence ([place]) 02/07/2018  . History of psychosis 02/07/2018  . History of insomnia 02/07/2018   Social History   Tobacco Use  . Smoking status: Never Smoker  . Smokeless tobacco: Never Used  Substance Use Topics  . Alcohol use: Not Currently    Comment: alcohol dependence prior to 2007  . Drug use: Never   Allergies  Allergen Reactions  . Hydroxyzine Anaphylaxis    Tongue swollen  . Other Other (See Comments)    Food poisoning   . Shrimp [Shellfish Allergy] Other (See Comments)    Food poisoning      Medications: Outpatient Medications Prior to Visit  Medication Sig  . Acetaminophen 500 MG capsule   . calcium citrate-vitamin D (CITRACAL+D) 315-200 MG-UNIT tablet Take 4 tablets by mouth daily.  . cyanocobalamin (,VITAMIN B-12,) 1000 MCG/ML injection Inject 1 mL (1,000 mcg total) into the muscle every 30 (thirty) days.  Marland Kitchen FLUoxetine HCl 60 MG TABS Take 60 mg by mouth daily.  Marland Kitchen ketoconazole (NIZORAL) 2 % cream Apply 1 application topically daily.  Marland Kitchen letrozole (FEMARA) 2.5 MG tablet Take 1 tablet (2.5 mg total) by mouth daily.  Marland Kitchen lisinopril-hydrochlorothiazide (ZESTORETIC) 20-12.5 MG tablet Take 1 tablet by mouth daily.  . polyethylene glycol (MIRALAX / GLYCOLAX) 17 g packet Take 17 g by mouth daily.  . risperiDONE (RISPERDAL) 2 MG tablet Take 0.5 tablets (1 mg total) by mouth 2 (two) times daily.  . traZODone (DESYREL) 50 MG tablet Take 0.5-1 tablets (25-50 mg total) by mouth at bedtime as  needed for sleep.  . carbidopa-levodopa (SINEMET IR) 25-100 MG tablet Take 0.5 tablets by mouth in the morning, at noon, and at bedtime.   Facility-Administered Medications Prior to Visit  Medication Dose Route Frequency Provider  . lidocaine (PF) (XYLOCAINE) 1 % injection 4 mL  4 mL Intradermal Once Virginia Crews, MD  . lidocaine (PF) (XYLOCAINE) 1 % injection 4 mL  4 mL Intradermal Once Virginia Crews, MD  . methylPREDNISolone acetate (DEPO-MEDROL) injection 40 mg  40 mg Intramuscular Once Virginia Crews, MD  . methylPREDNISolone acetate (DEPO-MEDROL) injection 40 mg  40 mg Intramuscular Once Virginia Crews, MD    Review of Systems  Constitutional: Negative.   Gastrointestinal: Negative.   Genitourinary: Positive for enuresis, frequency and urgency. Negative for decreased urine volume, difficulty urinating, dyspareunia, dysuria, flank pain, hematuria, vaginal bleeding, vaginal discharge and vaginal pain.  Neurological: Negative for dizziness, light-headedness and headaches.      Objective    BP 115/70 (BP Location: Left Arm, Patient Position: Sitting, Cuff Size: Large)   Pulse 86   Temp (!) 96.2 F (35.7 C) (Temporal)   Wt 200 lb (90.7 kg)   BMI 32.28 kg/m    Physical Exam Constitutional:      General: She is not in acute distress.    Appearance: Normal appearance. She is not diaphoretic.  Cardiovascular:     Rate and Rhythm: Normal rate and regular rhythm.     Pulses: Normal pulses.     Heart sounds: Normal heart sounds.  Pulmonary:     Effort: Pulmonary effort is normal. No respiratory distress.     Breath sounds: Normal breath sounds. No wheezing.  Abdominal:     General: There is no distension.     Palpations: Abdomen is soft.     Tenderness: There is abdominal tenderness (suprapubic, mild). There is no right CVA tenderness, left CVA tenderness, guarding or rebound.  Musculoskeletal:     Right lower leg: No edema.     Left lower leg: No edema.  Neurological:     Mental Status: She is alert and oriented to person, place, and time. Mental status is at baseline.  Psychiatric:        Mood and Affect: Mood normal.        Behavior: Behavior normal.        Thought Content: Thought content normal.        Judgment: Judgment normal.       Results for  orders placed or performed in visit on 03/18/20  POCT urinalysis dipstick  Result Value Ref Range   Color, UA     Clarity, UA     Glucose, UA Negative Negative   Bilirubin, UA Negative    Ketones, UA Negative    Spec Grav, UA 1.025 1.010 - 1.025   Blood, UA Negative    pH, UA 7.5 5.0 - 8.0   Protein, UA Negative Negative   Urobilinogen, UA 0.2 0.2 or 1.0 E.U./dL   Nitrite, UA Positive    Leukocytes, UA Moderate (2+) (A) Negative   Appearance     Odor      Assessment & Plan     1. Cystitis - Symptoms and UA consistent with UTI -No systemic symptoms or signs of pyelonephritis -Will start treatment with 5day course of Keflex - no previous urine culture available for review -We will send urine culture to confirm sensitivities -Discussed return precautions  - POCT urinalysis dipstick - CULTURE, URINE COMPREHENSIVE  Return if symptoms worsen or fail to improve.      I, Lavon Paganini, MD, have reviewed all documentation for this visit. The documentation on 03/18/20 for the exam, diagnosis, procedures, and orders are all accurate and complete.   Bac[place]alupo, Dionne Bucy, MD, MPH [facility]

## 2020-03-20 ENCOUNTER — Ambulatory Visit: Payer: Medicare PPO

## 2020-03-20 ENCOUNTER — Other Ambulatory Visit: Payer: Self-pay

## 2020-03-20 DIAGNOSIS — R262 Difficulty in walking, not elsewhere classified: Secondary | ICD-10-CM

## 2020-03-20 DIAGNOSIS — M25551 Pain in right hip: Secondary | ICD-10-CM | POA: Diagnosis not present

## 2020-03-20 DIAGNOSIS — M25522 Pain in left elbow: Secondary | ICD-10-CM | POA: Diagnosis not present

## 2020-03-20 DIAGNOSIS — M25622 Stiffness of left elbow, not elsewhere classified: Secondary | ICD-10-CM | POA: Diagnosis not present

## 2020-03-20 DIAGNOSIS — M25552 Pain in left hip: Secondary | ICD-10-CM | POA: Diagnosis not present

## 2020-03-20 DIAGNOSIS — M6281 Muscle weakness (generalized): Secondary | ICD-10-CM

## 2020-03-20 LAB — CULTURE, URINE COMPREHENSIVE

## 2020-03-20 NOTE — Addendum Note (Signed)
Addended by: Blain Pais on: 03/20/2020 04:40 PM   Modules accepted: Orders

## 2020-03-20 NOTE — Therapy (Signed)
[facility] [address] [place], [address] Phone: [phone]   Fax:  [phone]  Physical Therapy Treatment  Patient Details  Name: Kendra [name] MRN: [id number] Date of Birth: [birth year] No data recorded  Encounter Date: 03/20/2020  PT End of Session - 03/20/20 1624    Visit Number  37    Number of Visits  50    Date for PT Re-Evaluation  03/19/20    Authorization Type  6/10    Authorization Time Period  March 2-May 18    PT Start Time  1345    PT Stop Time  1430    PT Time Calculation (min)  45 min    Activity Tolerance  Patient tolerated treatment well;No increased pain    Behavior During Therapy  WFL for tasks assessed/performed       Past Medical History:  Diagnosis Date  . Anxiety   . Breast cancer ([place]) 03/2018   right breast cancer at 11:00 and 1:00  . Bursitis    bilateral hips and knees  . Complication of anesthesia   . Depression   . Family history of breast cancer 03/24/2018  . Family history of lung cancer 03/24/2018  . History of alcohol dependence ([place]) 2007   no ETOH; resolved since 2007  . History of hiatal hernia   . History of psychosis 2014   due to sleep disturbance  . Hypertension   . Personal history of radiation therapy 06/2018-07/2018   right breast ca  . PONV (postoperative nausea and vomiting)     Past Surgical History:  Procedure Laterality Date  . BREAST BIOPSY Right 03/14/2018   11:00 DCIS and invasive ductal carcinoma  . BREAST BIOPSY Right 03/14/2018   1:00 Invasive ductal carcinoma  . BREAST LUMPECTOMY Right 04/27/2018   lumpectomy of 11 and 1:00 cancers, clear margins, negative LN  . BREAST LUMPECTOMY WITH RADIOACTIVE SEED AND SENTINEL LYMPH NODE BIOPSY Right 04/27/2018   Procedure: RIGHT BREAST LUMPECTOMY WITH RADIOACTIVE SEED X 2 AND RIGHT SENTINEL LYMPH NODE BIOPSY;  Surgeon: [name], MD;  Location: [facility];  Service:  General;  Laterality: Right;  . CATARACT EXTRACTION W/PHACO Left 08/30/2019   Procedure: CATARACT EXTRACTION PHACO AND INTRAOCULAR LENS PLACEMENT ([place]) LEFT panoptix toric  01:20.5  12.7%  10.26;  Surgeon: Leandrew Koyanagi, MD;  Location: [facility];  Service: Ophthalmology;  Laterality: Left;  . CATARACT EXTRACTION W/PHACO Right 09/20/2019   Procedure: CATARACT EXTRACTION PHACO AND INTRAOCULAR LENS PLACEMENT ([facility]) RIGHT 5.71  00:36.4  15.7%;  Surgeon: Leandrew Koyanagi, MD;  Location: [facility];  Service: Ophthalmology;  Laterality: Right;  . LAPAROTOMY N/A 02/10/2018   Procedure: EXPLORATORY LAPAROTOMY;  Surgeon: Isabel Caprice, MD;  Location: [facility];  Service: Gynecology;  Laterality: N/A;  . MASS EXCISION  01/2018   abdominal  . OVARIAN CYST REMOVAL    . SALPINGOOPHORECTOMY Bilateral 02/10/2018   Procedure: BILATERAL SALPINGO OOPHORECTOMY; PERITONEAL WASHINGS;  Surgeon: Isabel Caprice, MD;  Location: [facility];  Service: Gynecology;  Laterality: Bilateral;  . TONSILLECTOMY    . TONSILLECTOMY AND ADENOIDECTOMY  1953    There were no vitals filed for this visit.  Subjective Assessment - 03/20/20 1623    Subjective  Patient reports she continues to have dizziness and difficulty with transferring as well as walking for prolonged periods.    Pertinent History  Patient report unclear regarding onset. Visited MD on Sept 16 and received injection  with relief for approximately 1 month. Reports she cannot receive an additional injection pending eye surgery. Received dx from MD of hip bursitis in B hips. Reports pain with walking, stanindg, sitting 5/10 current 7/10 worst 3/10 best, with temporary relief when she is sitting "just right" and with naproxen. Agg: activity. Cormorbidities: hx of breast cancer, HTN, MCI, insomnia, cataracts    Patient Stated Goals  Be able to move without hurting, walk 1 hour    Currently in Pain?  No/denies            TREATMENT  Therapeutic  Exercise Sit to stand with focus on improving speed and strength - x 20 Amb with focus on improving stride length and speed with patient - x 1019ft Hip abduction in standing - x 20  Hip extension in sitting - x 20  Ambualtion for speed for short distances - 2 x 48ft  4 -way seated reaches forward, down, up, and backward - x 10    Performed exercises to improve hip strength and coordination overall   PT Education - 03/20/20 1354    Education provided  Yes    Education Details  form/technique with exercise    Person(s) Educated  Patient    Methods  Explanation;Demonstration    Comprehension  Verbalized understanding;Returned demonstration       PT Short Term Goals - 09/12/19 1419      PT SHORT TERM GOAL #1   Title  Patient will demonstrate complaince with HEP to speed recovery and reduce total number of visits.    Baseline  HEP given    Time  2    Period  Weeks    Status  New    Target Date  09/27/19        PT Long Term Goals - 03/20/20 1355      PT LONG TERM GOAL #1   Title  Patient will improve hip strength to 4/5 grossly to facilitate improved mobility with ADLs.    Baseline  3, 4-; 12/22 deferred to NV; 11/09/2019: 4-/5; 12/07/2019 L hip flexor 4, abd and ext 3+; 01/29/2020: 4/5 for most hip motions, 3+/5 for abd, ext; 03/20/2020: 4/5 for hip movements    Time  8    Period  Weeks    Status  On-going      PT LONG TERM GOAL #2   Title  Patient will demonstrate walking tolerance increased to 30 minutes without increase in symptoms to demonstrate improved ability to perform walking program for cardiovascular health.    Baseline  10 minute tolerance (patient report); 12/22 10 minutes (per pt report); 11/09/2019: 7 min; 12/07/2019 unable to assess due to icy streets;  01/29/2020: 10 min; 03/20/2020    Time  8    Period  Weeks    Status  On-going      PT LONG TERM GOAL #3   Title  Patient will report worst pain of 3/10 during therapy to demonstrate reduced disability    Baseline   Worst 7/10; 12/22 worst 9/10; 11/09/2019: 9/10; 12/07/2019 7/10; 01/29/2020: 7/10    Time  8    Period  Weeks    Status  On-going      PT LONG TERM GOAL #4   Title  Patient will demonstrate ability to perform all bed mobility maneuvers without increase in pain for reduced disability with mobility and transfers with ADLs.    Baseline  Reports pain with bed mobility supine <> sidelying L/R, sidelying to sitting EOB; 12/22  pt able to perform I without increased pain    Time  8    Period  Weeks    Status  Achieved      PT LONG TERM GOAL #5   Title  Patient will improve 10MW speed to 1.0 m/sec to meet norms for minimal fall risk and full community ambulation.    Baseline  0.88 m/sec; 12/22 0.96 m/sec; 11/09/2019: deferred; 12/07/2019 0.90 m/sec; 01/29/2020: .9  m/sec; 03/20/2020: .86m/s    Time  8    Period  Weeks    Status  On-going      PT LONG TERM GOAL #6   Title  Patient will increase 6MWT by 200 ft to demonstrate improvement in functional capacity for transition to cardiac walking program    Baseline  6MWT = 1195; 1/7/20201: deferred; 12/07/2019 1095; 01/29/2020: deferred secondary to fatigue; 03/20/2020: 1067ft    Time  8    Period  Weeks    Status  Deferred      PT LONG TERM GOAL #7   Title  Patient will improve her 5xsts to under 15 sec to indicate singificnat improvement in LE functional strength and decrease in fall risk.    Baseline  24.5 sec; 03/20/2020: 19.19 sec    Time  6    Period  Weeks    Status  On-going            Plan - 03/20/20 1626    Clinical Impression Statement  Patient is making progress towards long term goals as she has improved in functional lower exetremity strength as well as improvement in hip strength as indicated by improvement in the 5xSTS. Although patient is improving she contineus to have limitations in walking with an unchanging score in 24minWT and 48mwt. Patient has increased difficulty with performing standing and walking as she has increased dizziness  overall. Patient will benefit from further skileld therapy focused on improving these limitations to return to proir level of function.    Personal Factors and Comorbidities  Age;Comorbidity 3+;Time since onset of injury/illness/exacerbation;Comorbidity 2    Comorbidities  MCI, HTN, CA, cataracts    Examination-Activity Limitations  Bend;Sit;Squat;Stairs;Stand;Sleep    Examination-Participation Restrictions  Cleaning;Community Activity    Stability/Clinical Decision Making  Evolving/Moderate complexity    Rehab Potential  Fair    Clinical Impairments Affecting Rehab Potential  MCI    PT Frequency  2x / week    PT Duration  6 weeks    PT Treatment/Interventions  ADLs/Self Care Home Management;Therapeutic exercise;Patient/family education;Neuromuscular re-education;Therapeutic activities;Functional mobility training;Balance training;Manual techniques;Gait training;Stair training;Moist Heat;Electrical Stimulation;Cryotherapy;Taping;Dry needling;Energy conservation;Passive range of motion;Joint Manipulations    PT Next Visit Plan  Progress glute therex, pelvic-hip dissociation; check and progress HEP with stretches    PT Home Exercise Plan  lateral step ups, step ups, hip kickers ext/abd red theraband    Consulted and Agree with Plan of Care  Patient       Patient will benefit from skilled therapeutic intervention in order to improve the following deficits and impairments:  Decreased range of motion, Postural dysfunction, Pain, Abnormal gait, Decreased activity tolerance, Decreased coordination, Decreased endurance, Decreased balance, Decreased mobility, Difficulty walking, Hypomobility, Increased muscle spasms, Impaired perceived functional ability, Decreased strength, Impaired flexibility  Visit Diagnosis: Muscle weakness (generalized)  Difficulty in walking, not elsewhere classified     Problem List Patient Active Problem List   Diagnosis Date Noted  . Parkinson disease ([place]) 02/22/2020   . 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 Bipolar disorder, in  full remission, most recent episode depressed ([place]) 11/08/2019  . Avitaminosis D 08/24/2019  . Insomnia due to mental condition 05/31/2019  . Bipolar I disorder, most recent episode depressed ([place]) 05/31/2019  . Mild cognitive disorder 05/31/2019  . Alcohol use disorder, moderate, in sustained remission ([place]) 05/31/2019  . Elevated tumor markers 05/27/2019  . GAD (generalized anxiety disorder) 04/10/2019  . Altered mental status 04/10/2019  . MDD (major depressive disorder), severe ([place]) 01/31/2019  . High serum carbohydrate antigen 19-9 (CA19-9) 12/01/2018  . Adjustment disorder with anxious mood 11/24/2018  . Essential hypertension 11/24/2018  . B12 deficiency 10/17/2018  . Incisional hernia, without obstruction or gangrene 09/12/2018  . Genetic testing 04/07/2018  . Family history of lung cancer 03/24/2018  . Malignant neoplasm of upper-outer quadrant of right breast in female, estrogen receptor positive ([place]) 03/17/2018  . Malignant neoplasm of upper-inner quadrant of right breast in female, estrogen receptor positive ([place]) 03/17/2018  . History of alcohol dependence ([place]) 02/07/2018  . History of psychosis 02/07/2018  . History of insomnia 02/07/2018    Blythe Stanford, PT DPT 03/20/2020, 4:34 PM  [facility] [address] [place], [address] Phone: [phone]   Fax:  [phone]  Name: Shavonta Zamboni MRN: [id number] Date of Birth: 05-21-1946

## 2020-03-21 ENCOUNTER — Telehealth: Payer: Self-pay

## 2020-03-21 NOTE — Telephone Encounter (Signed)
-----   Message from Virginia Crews, MD sent at 03/21/2020  8:59 AM EDT ----- Urine culture confirms UTI that is sensitive to abx prescribed.  Hope she is doing better.

## 2020-03-21 NOTE — Telephone Encounter (Signed)
Pt advised.   Thanks,   -Laura  

## 2020-03-25 ENCOUNTER — Ambulatory Visit: Payer: Medicare PPO

## 2020-03-25 ENCOUNTER — Other Ambulatory Visit: Payer: Self-pay

## 2020-03-25 DIAGNOSIS — M25552 Pain in left hip: Secondary | ICD-10-CM | POA: Diagnosis not present

## 2020-03-25 DIAGNOSIS — M6281 Muscle weakness (generalized): Secondary | ICD-10-CM | POA: Diagnosis not present

## 2020-03-25 DIAGNOSIS — R262 Difficulty in walking, not elsewhere classified: Secondary | ICD-10-CM

## 2020-03-25 DIAGNOSIS — M25522 Pain in left elbow: Secondary | ICD-10-CM | POA: Diagnosis not present

## 2020-03-25 DIAGNOSIS — M25622 Stiffness of left elbow, not elsewhere classified: Secondary | ICD-10-CM | POA: Diagnosis not present

## 2020-03-25 DIAGNOSIS — M25551 Pain in right hip: Secondary | ICD-10-CM | POA: Diagnosis not present

## 2020-03-25 NOTE — Therapy (Signed)
[facility] [address] [place], [address] Phone: [phone]   Fax:  [phone]  Physical Therapy Treatment  Patient Details  Name: Kendra [name] MRN: [id number] Date of Birth: [birth year] No data recorded  Encounter Date: 03/25/2020  PT End of Session - 03/25/20 1441    Visit Number  38    Number of Visits  50    Date for PT Re-Evaluation  05/01/20    Authorization Type  1/10    Authorization Time Period  until 06/12/2020    PT Start Time  1430    PT Stop Time  1515    PT Time Calculation (min)  45 min    Activity Tolerance  Patient tolerated treatment well;No increased pain    Behavior During Therapy  WFL for tasks assessed/performed       Past Medical History:  Diagnosis Date  . Anxiety   . Breast cancer ([place]) 03/2018   right breast cancer at 11:00 and 1:00  . Bursitis    bilateral hips and knees  . Complication of anesthesia   . Depression   . Family history of breast cancer 03/24/2018  . Family history of lung cancer 03/24/2018  . History of alcohol dependence ([place]) 2007   no ETOH; resolved since 2007  . History of hiatal hernia   . History of psychosis 2014   due to sleep disturbance  . Hypertension   . Personal history of radiation therapy 06/2018-07/2018   right breast ca  . PONV (postoperative nausea and vomiting)     Past Surgical History:  Procedure Laterality Date  . BREAST BIOPSY Right 03/14/2018   11:00 DCIS and invasive ductal carcinoma  . BREAST BIOPSY Right 03/14/2018   1:00 Invasive ductal carcinoma  . BREAST LUMPECTOMY Right 04/27/2018   lumpectomy of 11 and 1:00 cancers, clear margins, negative LN  . BREAST LUMPECTOMY WITH RADIOACTIVE SEED AND SENTINEL LYMPH NODE BIOPSY Right 04/27/2018   Procedure: RIGHT BREAST LUMPECTOMY WITH RADIOACTIVE SEED X 2 AND RIGHT SENTINEL LYMPH NODE BIOPSY;  Surgeon: [name], MD;  Location: [facility];  Service:  General;  Laterality: Right;  . CATARACT EXTRACTION W/PHACO Left 08/30/2019   Procedure: CATARACT EXTRACTION PHACO AND INTRAOCULAR LENS PLACEMENT ([place]) LEFT panoptix toric  01:20.5  12.7%  10.26;  Surgeon: Leandrew Koyanagi, MD;  Location: [facility];  Service: Ophthalmology;  Laterality: Left;  . CATARACT EXTRACTION W/PHACO Right 09/20/2019   Procedure: CATARACT EXTRACTION PHACO AND INTRAOCULAR LENS PLACEMENT ([facility]) RIGHT 5.71  00:36.4  15.7%;  Surgeon: Leandrew Koyanagi, MD;  Location: [facility];  Service: Ophthalmology;  Laterality: Right;  . LAPAROTOMY N/A 02/10/2018   Procedure: EXPLORATORY LAPAROTOMY;  Surgeon: Isabel Caprice, MD;  Location: [facility];  Service: Gynecology;  Laterality: N/A;  . MASS EXCISION  01/2018   abdominal  . OVARIAN CYST REMOVAL    . SALPINGOOPHORECTOMY Bilateral 02/10/2018   Procedure: BILATERAL SALPINGO OOPHORECTOMY; PERITONEAL WASHINGS;  Surgeon: Isabel Caprice, MD;  Location: [facility];  Service: Gynecology;  Laterality: Bilateral;  . TONSILLECTOMY    . TONSILLECTOMY AND ADENOIDECTOMY  1953    There were no vitals filed for this visit.  Subjective Assessment - 03/25/20 1435    Subjective  Patient reports she has had two falls in the past two weeks. She states the falls have been from squatting and pick something up off the floor and reports she fell forward.    Pertinent History  Patient report unclear regarding onset. Visited MD on Sept 16 and received injection with relief for approximately 1 month. Reports she cannot receive an additional injection pending eye surgery. Received dx from MD of hip bursitis in B hips. Reports pain with walking, stanindg, sitting 5/10 current 7/10 worst 3/10 best, with temporary relief when she is sitting "just right" and with naproxen. Agg: activity. Cormorbidities: hx of breast cancer, HTN, MCI, insomnia, cataracts    Patient Stated Goals  Be able to move without hurting, walk 1 hour    Currently in Pain?   No/denies         [facility] PT Assessment - 03/25/20 1519      Standardized Balance Assessment   Standardized Balance Assessment  Dynamic Gait Index      Dynamic Gait Index   Level Surface  Mild Impairment    Change in Gait Speed  Moderate Impairment    Gait with Horizontal Head Turns  Moderate Impairment    Gait with Vertical Head Turns  Mild Impairment    Gait and Pivot Turn  Mild Impairment    Step Over Obstacle  Mild Impairment    Step Around Obstacles  Mild Impairment    Steps  Mild Impairment    Total Score  14        VESTIBULAR AND BALANCE EVALUATION   HISTORY:  Subjective history of current problem: Patient reports that she began to have dizziness in March. Patient reports she has had 2 falls within the past 2 weeks when she reached down to get something and she continued to fall forward. Patient denies vertigo. Patient describes her dizziness as unsteadiness and imbalance and falling. Patient reports that horiziontal head turns bring on her dizziness. Patient also reports mild dizziness when she leans back. Patient reports that sitting down makes her dizziness better. Patient reports that her dizziness is constant in standing even when standing still. Patient reports that she does not get dizziness when she sits with head turns, but during the vestibular screen, able to bring on dizziness with head turns. Patient reports that she is getting dizziness every time she stands up, multiple times a day. In seated position, patient became mildly dizzy with looking up during vestibular testing. Patient reports that she has seen a neurologist in regards to the dizziness and they diagnosed her with Parkinson's Disease. Patient was recently diagnoses with Parkinson's disease with patient reports she has a right hand tremor. Patient reports she has not seen an ENT physician in regards to her symptoms. Patient reports her walking is slower. Patient denies vertigo and lightheadedness. Patient  scored 14/24 on the DGI this date and is at high risk for falls.   History of similar episodes: Reports she has had vertigo in the past and states she just waited until it went away.   Auditory complaints (tinnitus, pain, drainage): tinnitus bilaterally right greater than left chronically. Denies pain or drainage from ears Vision (last eye exam, diplopia, recent changes): denies  OCULOMOTOR / VESTIBULAR TESTING: Oculomotor Exam- Room Light  Normal Abnormal Comments  Ocular Alignment N    Ocular ROM N    Spontaneous Nystagmus N    Gaze evoked Nystagmus N    Smooth Pursuit N  Observed one hypometric saccade  Saccades  Abn Hypometric saccades all fields  VOR  Abn Blurring of target and dizziness  VOR Cancellation N    Left Head Impulse N  One hypometric saccade noted could be startle reflex  Right Head Impulse  Abn Multiple Hypometric saccade noted  Head Shaking Nystagmus N  Pt reports mild dizziness    BPPV TESTS:  Symptoms Duration Intensity Nystagmus  Left Dix-Hallpike None   None observed  Right Dix-Hallpike None   None observed     Patient with potential indicators of central and peripheral signs with vestibulo-ocular testing. Patient with negative bilateral Dix-Hallpike tests this date. Patient scored 14/24 on the DGI this date and is at high risk for falls. Patient issued VOR X 1 in sitting 30 second reps on plain background for HEP with handout provided.         PT Education - 03/25/20 1441    Education provided  Yes    Education Details  form/technique with exercise    Person(s) Educated  Patient    Methods  Explanation;Demonstration    Comprehension  Verbalized understanding;Returned demonstration       PT Short Term Goals - 09/12/19 1419      PT SHORT TERM GOAL #1   Title  Patient will demonstrate complaince with HEP to speed recovery and reduce total number of visits.    Baseline  HEP given    Time  2    Period  Weeks    Status  New    Target Date   09/27/19        PT Long Term Goals - 03/20/20 1355      PT LONG TERM GOAL #1   Title  Patient will improve hip strength to 4/5 grossly to facilitate improved mobility with ADLs.    Baseline  3, 4-; 12/22 deferred to NV; 11/09/2019: 4-/5; 12/07/2019 L hip flexor 4, abd and ext 3+; 01/29/2020: 4/5 for most hip motions, 3+/5 for abd, ext; 03/20/2020: 4/5 for hip movements    Time  8    Period  Weeks    Status  On-going      PT LONG TERM GOAL #2   Title  Patient will demonstrate walking tolerance increased to 30 minutes without increase in symptoms to demonstrate improved ability to perform walking program for cardiovascular health.    Baseline  10 minute tolerance (patient report); 12/22 10 minutes (per pt report); 11/09/2019: 7 min; 12/07/2019 unable to assess due to icy streets;  01/29/2020: 10 min; 03/20/2020    Time  8    Period  Weeks    Status  On-going      PT LONG TERM GOAL #3   Title  Patient will report worst pain of 3/10 during therapy to demonstrate reduced disability    Baseline  Worst 7/10; 12/22 worst 9/10; 11/09/2019: 9/10; 12/07/2019 7/10; 01/29/2020: 7/10    Time  8    Period  Weeks    Status  On-going      PT LONG TERM GOAL #4   Title  Patient will demonstrate ability to perform all bed mobility maneuvers without increase in pain for reduced disability with mobility and transfers with ADLs.    Baseline  Reports pain with bed mobility supine <> sidelying L/R, sidelying to sitting EOB; 12/22 pt able to perform I without increased pain    Time  8    Period  Weeks    Status  Achieved      PT LONG TERM GOAL #5   Title  Patient will improve 10MW speed to 1.0 m/sec to meet norms for minimal fall risk and full community ambulation.    Baseline  0.88 m/sec; 12/22 0.96 m/sec; 11/09/2019: deferred; 12/07/2019 0.90 m/sec; 01/29/2020: .  9  m/sec; 03/20/2020: .43m/s    Time  8    Period  Weeks    Status  On-going      PT LONG TERM GOAL #6   Title  Patient will increase 6MWT by 200 ft to  demonstrate improvement in functional capacity for transition to cardiac walking program    Baseline  6MWT = 1195; 1/7/20201: deferred; 12/07/2019 1095; 01/29/2020: deferred secondary to fatigue; 03/20/2020: 1010ft    Time  8    Period  Weeks    Status  Deferred      PT LONG TERM GOAL #7   Title  Patient will improve her 5xsts to under 15 sec to indicate singificnat improvement in LE functional strength and decrease in fall risk.    Baseline  24.5 sec; 03/20/2020: 19.19 sec    Time  6    Period  Weeks    Status  On-going            Plan - 03/25/20 1614    Clinical Impression Statement  Patient evaluated on dizziness today and performed vestibular based rehad secondary to dizziness symptoms. Patient with increased onset of symptoms with performing VOR testing most notably in standing and walking. Patient continues to have limitations with performing balancing and strengthening exercises. Patient will benefit form further skilled therapy to reutrn to prior level of function.    Personal Factors and Comorbidities  Age;Comorbidity 3+;Time since onset of injury/illness/exacerbation;Comorbidity 2    Comorbidities  MCI, HTN, CA, cataracts    Examination-Activity Limitations  Bend;Sit;Squat;Stairs;Stand;Sleep    Examination-Participation Restrictions  Cleaning;Community Activity    Stability/Clinical Decision Making  Evolving/Moderate complexity    Rehab Potential  Fair    Clinical Impairments Affecting Rehab Potential  MCI    PT Frequency  2x / week    PT Duration  6 weeks    PT Treatment/Interventions  ADLs/Self Care Home Management;Therapeutic exercise;Patient/family education;Neuromuscular re-education;Therapeutic activities;Functional mobility training;Balance training;Manual techniques;Gait training;Stair training;Moist Heat;Electrical Stimulation;Cryotherapy;Taping;Dry needling;Energy conservation;Passive range of motion;Joint Manipulations    PT Next Visit Plan  Progress glute therex,  pelvic-hip dissociation; check and progress HEP with stretches    PT Home Exercise Plan  lateral step ups, step ups, hip kickers ext/abd red theraband    Consulted and Agree with Plan of Care  Patient       Patient will benefit from skilled therapeutic intervention in order to improve the following deficits and impairments:  Decreased range of motion, Postural dysfunction, Pain, Abnormal gait, Decreased activity tolerance, Decreased coordination, Decreased endurance, Decreased balance, Decreased mobility, Difficulty walking, Hypomobility, Increased muscle spasms, Impaired perceived functional ability, Decreased strength, Impaired flexibility  Visit Diagnosis: Muscle weakness (generalized)  Difficulty in walking, not elsewhere classified     Problem List Patient Active Problem List   Diagnosis Date Noted  . Parkinson disease ([place]) 02/22/2020  . 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 Bipolar disorder, in full remission, most recent episode depressed ([place]) 11/08/2019  . Avitaminosis D 08/24/2019  . Insomnia due to mental condition 05/31/2019  . Bipolar I disorder, most recent episode depressed ([place]) 05/31/2019  . Mild cognitive disorder 05/31/2019  . Alcohol use disorder, moderate, in sustained remission ([place]) 05/31/2019  . Elevated tumor markers 05/27/2019  . GAD (generalized anxiety disorder) 04/10/2019  . Altered mental status 04/10/2019  . MDD (major depressive disorder), severe ([place]) 01/31/2019  . High serum carbohydrate antigen 19-9 (CA19-9) 12/01/2018  . Adjustment disorder with anxious mood 11/24/2018  . Essential hypertension 11/24/2018  . B12 deficiency 10/17/2018  .  Incisional hernia, without obstruction or gangrene 09/12/2018  . Genetic testing 04/07/2018  . Family history of lung cancer 03/24/2018  . Malignant neoplasm of upper-outer quadrant of right breast in female, estrogen receptor positive ([place]) 03/17/2018  . Malignant neoplasm of upper-inner quadrant of right  breast in female, estrogen receptor positive ([place]) 03/17/2018  . History of alcohol dependence ([place]) 02/07/2018  . History of psychosis 02/07/2018  . History of insomnia 02/07/2018    Blythe Stanford, PT DPT 03/25/2020, 4:20 PM  [facility] [address] [place], [address] Phone: [phone]   Fax:  [phone]  Name: Reace Farquhar MRN: [id number] Date of Birth: 1945/12/06

## 2020-03-27 ENCOUNTER — Encounter: Payer: Self-pay | Admitting: Occupational Therapy

## 2020-03-27 ENCOUNTER — Other Ambulatory Visit: Payer: Self-pay

## 2020-03-27 ENCOUNTER — Ambulatory Visit: Payer: Medicare PPO | Admitting: Occupational Therapy

## 2020-03-27 ENCOUNTER — Ambulatory Visit: Payer: Medicare PPO

## 2020-03-27 DIAGNOSIS — R262 Difficulty in walking, not elsewhere classified: Secondary | ICD-10-CM

## 2020-03-27 DIAGNOSIS — M25622 Stiffness of left elbow, not elsewhere classified: Secondary | ICD-10-CM

## 2020-03-27 DIAGNOSIS — M6281 Muscle weakness (generalized): Secondary | ICD-10-CM | POA: Diagnosis not present

## 2020-03-27 DIAGNOSIS — M25522 Pain in left elbow: Secondary | ICD-10-CM | POA: Diagnosis not present

## 2020-03-27 DIAGNOSIS — M25552 Pain in left hip: Secondary | ICD-10-CM | POA: Diagnosis not present

## 2020-03-27 DIAGNOSIS — M25551 Pain in right hip: Secondary | ICD-10-CM | POA: Diagnosis not present

## 2020-03-27 NOTE — Therapy (Signed)
[facility] [address], [place], [address] Phone: [phone]   Fax:  [phone]  Physical Therapy Treatment  Patient Details  Name: Kendra [name] MRN: [id number] Date of Birth: 1946/02/19 No data recorded  Encounter Date: 03/27/2020  PT End of Session - 03/27/20 1310    Visit Number  39    Number of Visits  50    Date for PT Re-Evaluation  05/01/20    Authorization Type  2/10    Authorization Time Period  until 06/12/2020    PT Start Time  1300    PT Stop Time  1345    PT Time Calculation (min)  45 min    Activity Tolerance  Patient tolerated treatment well;No increased pain    Behavior During Therapy  WFL for tasks assessed/performed       Past Medical History:  Diagnosis Date  . Anxiety   . Breast cancer ([place]) 03/2018   right breast cancer at 11:00 and 1:00  . Bursitis    bilateral hips and knees  . Complication of anesthesia   . Depression   . Family history of breast cancer 03/24/2018  . Family history of lung cancer 03/24/2018  . History of alcohol dependence ([place]) 2007   no ETOH; resolved since 2007  . History of hiatal hernia   . History of psychosis 2014   due to sleep disturbance  . Hypertension   . Personal history of radiation therapy 06/2018-07/2018   right breast ca  . PONV (postoperative nausea and vomiting)     Past Surgical History:  Procedure Laterality Date  . BREAST BIOPSY Right 03/14/2018   11:00 DCIS and invasive ductal carcinoma  . BREAST BIOPSY Right 03/14/2018   1:00 Invasive ductal carcinoma  . BREAST LUMPECTOMY Right 04/27/2018   lumpectomy of 11 and 1:00 cancers, clear margins, negative LN  . BREAST LUMPECTOMY WITH RADIOACTIVE SEED AND SENTINEL LYMPH NODE BIOPSY Right 04/27/2018   Procedure: RIGHT BREAST LUMPECTOMY WITH RADIOACTIVE SEED X 2 AND RIGHT SENTINEL LYMPH NODE BIOPSY;  Surgeon: [name], MD;  Location: [facility];  Service:  General;  Laterality: Right;  . CATARACT EXTRACTION W/PHACO Left 08/30/2019   Procedure: CATARACT EXTRACTION PHACO AND INTRAOCULAR LENS PLACEMENT ([place]) LEFT panoptix toric  01:20.5  12.7%  10.26;  Surgeon: Leandrew Koyanagi, MD;  Location: [facility];  Service: Ophthalmology;  Laterality: Left;  . CATARACT EXTRACTION W/PHACO Right 09/20/2019   Procedure: CATARACT EXTRACTION PHACO AND INTRAOCULAR LENS PLACEMENT ([facility]) RIGHT 5.71  00:36.4  15.7%;  Surgeon: Leandrew Koyanagi, MD;  Location: [facility];  Service: Ophthalmology;  Laterality: Right;  . LAPAROTOMY N/A 02/10/2018   Procedure: EXPLORATORY LAPAROTOMY;  Surgeon: Isabel Caprice, MD;  [facility];  Service: Gynecology;  Laterality: N/A;  . MASS EXCISION  01/2018   abdominal  . OVARIAN CYST REMOVAL    . SALPINGOOPHORECTOMY Bilateral 02/10/2018   Procedure: BILATERAL SALPINGO OOPHORECTOMY; PERITONEAL WASHINGS;  Surgeon: Isabel Caprice, MD;  [facility];  Service: Gynecology;  Laterality: Bilateral;  . TONSILLECTOMY    . TONSILLECTOMY AND ADENOIDECTOMY  1953    There were no vitals filed for this visit.  Subjective Assessment - 03/27/20 1259    Subjective  Patient states no major changes since the previous session. Patient reports no falls since the previous session.    Pertinent History  Patient report unclear regarding onset. Visited MD on Sept 16 and received injection with relief  for approximately 1 month. Reports she cannot receive an additional injection pending eye surgery. Received dx from MD of hip bursitis in B hips. Reports pain with walking, stanindg, sitting 5/10 current 7/10 worst 3/10 best, with temporary relief when she is sitting "just right" and with naproxen. Agg: activity. Cormorbidities: hx of breast cancer, HTN, MCI, insomnia, cataracts    Patient Stated Goals  Be able to move without hurting, walk 1 hour    Currently in Pain?  No/denies         TREATMENT Therapeutic Exercise 4  -way seated reaches forward, down, up, and backward - x 10  Ambulation with head turns up/down - x 252ft  Ambulation with head turns left/right - x 256ft Ball tosses in sitting with eye following with head turns - 2 x 10 Forward step arms overhead with clapping - x 15 B Backward steps with arms over with clapping - x 15 B  Forward/backward steps with reciprocal overhead motion - x 15 B Performed exercises to improve hip strength and coordination overall    PT Education - 03/27/20 1308    Education provided  Yes    Education Details  form/technique with exercise    Person(s) Educated  Patient    Methods  Explanation;Demonstration    Comprehension  Verbalized understanding;Returned demonstration       PT Short Term Goals - 09/12/19 1419      PT SHORT TERM GOAL #1   Title  Patient will demonstrate complaince with HEP to speed recovery and reduce total number of visits.    Baseline  HEP given    Time  2    Period  Weeks    Status  New    Target Date  09/27/19        PT Long Term Goals - 03/20/20 1355      PT LONG TERM GOAL #1   Title  Patient will improve hip strength to 4/5 grossly to facilitate improved mobility with ADLs.    Baseline  3, 4-; 12/22 deferred to NV; 11/09/2019: 4-/5; 12/07/2019 L hip flexor 4, abd and ext 3+; 01/29/2020: 4/5 for most hip motions, 3+/5 for abd, ext; 03/20/2020: 4/5 for hip movements    Time  8    Period  Weeks    Status  On-going      PT LONG TERM GOAL #2   Title  Patient will demonstrate walking tolerance increased to 30 minutes without increase in symptoms to demonstrate improved ability to perform walking program for cardiovascular health.    Baseline  10 minute tolerance (patient report); 12/22 10 minutes (per pt report); 11/09/2019: 7 min; 12/07/2019 unable to assess due to icy streets;  01/29/2020: 10 min; 03/20/2020    Time  8    Period  Weeks    Status  On-going      PT LONG TERM GOAL #3   Title  Patient will report worst pain of 3/10 during  therapy to demonstrate reduced disability    Baseline  Worst 7/10; 12/22 worst 9/10; 11/09/2019: 9/10; 12/07/2019 7/10; 01/29/2020: 7/10    Time  8    Period  Weeks    Status  On-going      PT LONG TERM GOAL #4   Title  Patient will demonstrate ability to perform all bed mobility maneuvers without increase in pain for reduced disability with mobility and transfers with ADLs.    Baseline  Reports pain with bed mobility supine <> sidelying L/R, sidelying to sitting  EOB; 12/22 pt able to perform I without increased pain    Time  8    Period  Weeks    Status  Achieved      PT LONG TERM GOAL #5   Title  Patient will improve 10MW speed to 1.0 m/sec to meet norms for minimal fall risk and full community ambulation.    Baseline  0.88 m/sec; 12/22 0.96 m/sec; 11/09/2019: deferred; 12/07/2019 0.90 m/sec; 01/29/2020: .9  m/sec; 03/20/2020: .9m/s    Time  8    Period  Weeks    Status  On-going      PT LONG TERM GOAL #6   Title  Patient will increase 6MWT by 200 ft to demonstrate improvement in functional capacity for transition to cardiac walking program    Baseline  6MWT = 1195; 1/7/20201: deferred; 12/07/2019 1095; 01/29/2020: deferred secondary to fatigue; 03/20/2020: 1067ft    Time  8    Period  Weeks    Status  Deferred      PT LONG TERM GOAL #7   Title  Patient will improve her 5xsts to under 15 sec to indicate singificnat improvement in LE functional strength and decrease in fall risk.    Baseline  24.5 sec; 03/20/2020: 19.19 sec    Time  6    Period  Weeks    Status  On-going            Plan - 03/27/20 1324    Clinical Impression Statement  Continued to focus on improving vestibular system and treaching large movements to counteract Parkinson's disease symptoms. Patient shows improvement with these motions requiring less cueis to perform the exercises indicating improvement in motor control. Although patient is improving she continues to have balance and vestibular difficulties limiting her  overall progress. Patient will benefit from further skilled therapy to return to prior level of function.    Personal Factors and Comorbidities  Age;Comorbidity 3+;Time since onset of injury/illness/exacerbation;Comorbidity 2    Comorbidities  MCI, HTN, CA, cataracts    Examination-Activity Limitations  Bend;Sit;Squat;Stairs;Stand;Sleep    Examination-Participation Restrictions  Cleaning;Community Activity    Stability/Clinical Decision Making  Evolving/Moderate complexity    Rehab Potential  Fair    Clinical Impairments Affecting Rehab Potential  MCI    PT Frequency  2x / week    PT Duration  6 weeks    PT Treatment/Interventions  ADLs/Self Care Home Management;Therapeutic exercise;Patient/family education;Neuromuscular re-education;Therapeutic activities;Functional mobility training;Balance training;Manual techniques;Gait training;Stair training;Moist Heat;Electrical Stimulation;Cryotherapy;Taping;Dry needling;Energy conservation;Passive range of motion;Joint Manipulations    PT Next Visit Plan  Progress glute therex, pelvic-hip dissociation; check and progress HEP with stretches    PT Home Exercise Plan  lateral step ups, step ups, hip kickers ext/abd red theraband    Consulted and Agree with Plan of Care  Patient       Patient will benefit from skilled therapeutic intervention in order to improve the following deficits and impairments:  Decreased range of motion, Postural dysfunction, Pain, Abnormal gait, Decreased activity tolerance, Decreased coordination, Decreased endurance, Decreased balance, Decreased mobility, Difficulty walking, Hypomobility, Increased muscle spasms, Impaired perceived functional ability, Decreased strength, Impaired flexibility  Visit Diagnosis: Muscle weakness (generalized)  Difficulty in walking, not elsewhere classified     Problem List Patient Active Problem List   Diagnosis Date Noted  . Parkinson disease ([place]) 02/22/2020  . Mucinous cystadenoma  12/24/2019  . Bipolar disorder, in full remission, most recent episode depressed ([place]) 11/08/2019  . Avitaminosis D 08/24/2019  . Insomnia due to  mental condition 05/31/2019  . Bipolar I disorder, most recent episode depressed ([place]) 05/31/2019  . Mild cognitive disorder 05/31/2019  . Alcohol use disorder, moderate, in sustained remission ([place]) 05/31/2019  . Elevated tumor markers 05/27/2019  . GAD (generalized anxiety disorder) 04/10/2019  . Altered mental status 04/10/2019  . MDD (major depressive disorder), severe ([place]) 01/31/2019  . High serum carbohydrate antigen 19-9 (CA19-9) 12/01/2018  . Adjustment disorder with anxious mood 11/24/2018  . Essential hypertension 11/24/2018  . B12 deficiency 10/17/2018  . Incisional hernia, without obstruction or gangrene 09/12/2018  . Genetic testing 04/07/2018  . Family history of lung cancer 03/24/2018  . Malignant neoplasm of upper-outer quadrant of right breast in female, estrogen receptor positive ([place]) 03/17/2018  . Malignant neoplasm of upper-inner quadrant of right breast in female, estrogen receptor positive ([place]) 03/17/2018  . History of alcohol dependence ([place]) 02/07/2018  . History of psychosis 02/07/2018  . History of insomnia 02/07/2018    Blythe Stanford, PT DPT 03/27/2020, 3:09 PM  [facility] [address], [place], [address] Phone: [phone]   Fax:  [phone]  Name: Lashawanda Norbeck MRN: [id number] Date of Birth: 1946-03-07

## 2020-03-28 ENCOUNTER — Ambulatory Visit (INDEPENDENT_AMBULATORY_CARE_PROVIDER_SITE_OTHER): Payer: Medicare PPO | Admitting: Licensed Clinical Social Worker

## 2020-03-28 DIAGNOSIS — F3130 Bipolar disorder, current episode depressed, mild or moderate severity, unspecified: Secondary | ICD-10-CM

## 2020-03-28 NOTE — Therapy (Signed)
[facility] [address] [place], [address] Phone: [phone]   Fax:  [phone]  Occupational Therapy Treatment  Patient Details  Name: Kendra [name] MRN: [id number] Date of Birth: 12/11/1945 Referring Provider (OT): Vance Peper Date: 03/27/2020  OT End of Session - 03/28/20 1655    Visit Number  9    Number of Visits  14    Date for OT Re-Evaluation  05/31/20    OT Start Time  1435    OT Stop Time  1515    OT Time Calculation (min)  40 min    Activity Tolerance  Patient tolerated treatment well    Behavior During Therapy  [facility] for tasks assessed/performed       Past Medical History:  Diagnosis Date  . Anxiety   . Breast cancer ([place]) 03/2018   right breast cancer at 11:00 and 1:00  . Bursitis    bilateral hips and knees  . Complication of anesthesia   . Depression   . Family history of breast cancer 03/24/2018  . Family history of lung cancer 03/24/2018  . History of alcohol dependence ([place]) 2007   no ETOH; resolved since 2007  . History of hiatal hernia   . History of psychosis 2014   due to sleep disturbance  . Hypertension   . Personal history of radiation therapy 06/2018-07/2018   right breast ca  . PONV (postoperative nausea and vomiting)     Past Surgical History:  Procedure Laterality Date  . BREAST BIOPSY Right 03/14/2018   11:00 DCIS and invasive ductal carcinoma  . BREAST BIOPSY Right 03/14/2018   1:00 Invasive ductal carcinoma  . BREAST LUMPECTOMY Right 04/27/2018   lumpectomy of 11 and 1:00 cancers, clear margins, negative LN  . BREAST LUMPECTOMY WITH RADIOACTIVE SEED AND SENTINEL LYMPH NODE BIOPSY Right 04/27/2018   Procedure: RIGHT BREAST LUMPECTOMY WITH RADIOACTIVE SEED X 2 AND RIGHT SENTINEL LYMPH NODE BIOPSY;  Surgeon: [name], MD;  Location: [facility];  Service: General;  Laterality: Right;  . CATARACT EXTRACTION W/PHACO Left  08/30/2019   Procedure: CATARACT EXTRACTION PHACO AND INTRAOCULAR LENS PLACEMENT ([place]) LEFT panoptix toric  01:20.5  12.7%  10.26;  Surgeon: Leandrew Koyanagi, MD;  Location: [facility];  Service: Ophthalmology;  Laterality: Left;  . CATARACT EXTRACTION W/PHACO Right 09/20/2019   Procedure: CATARACT EXTRACTION PHACO AND INTRAOCULAR LENS PLACEMENT ([facility]) RIGHT 5.71  00:36.4  15.7%;  Surgeon: Leandrew Koyanagi, MD;  Location: [facility];  Service: Ophthalmology;  Laterality: Right;  . LAPAROTOMY N/A 02/10/2018   Procedure: EXPLORATORY LAPAROTOMY;  Surgeon: Isabel Caprice, MD;  [facility];  Service: Gynecology;  Laterality: N/A;  . MASS EXCISION  01/2018   abdominal  . OVARIAN CYST REMOVAL    . SALPINGOOPHORECTOMY Bilateral 02/10/2018   Procedure: BILATERAL SALPINGO OOPHORECTOMY; PERITONEAL WASHINGS;  Surgeon: Isabel Caprice, MD;  [facility];  Service: Gynecology;  Laterality: Bilateral;  . TONSILLECTOMY    . TONSILLECTOMY AND ADENOIDECTOMY  1953    There were no vitals filed for this visit.  Subjective Assessment - 03/27/20 1441    Subjective   Patient reports she has been doing exercises at home, using 2#.  Patient reports she still has tremors and continues to take her carpidopa levadopa 3 times a day.    Pertinent History  Pt fell 11/23/2019 and fracture her R elbow - minimal displaced radius head fx - pt in  sling if she is out and about - but refer to OT for ROM ,PROM and weight limited 1-2 lbs -    Patient Stated Goals  I would like the ROM , strength better so I can push up from chair and have no pain    Currently in Pain?  No/denies    Pain Score  0-No pain         [facility] OT Assessment - 03/28/20 1657      AROM   Right Elbow Flexion  150    Right Elbow Extension  0    Left Elbow Flexion  150    Left Elbow Extension  -8    Left Forearm Pronation  90 Degrees    Left Forearm Supination  90 Degrees    Right Wrist Extension  70 Degrees    Right  Wrist Flexion  82 Degrees    Left Wrist Extension  54 Degrees    Left Wrist Flexion  72 Degrees      Strength   Right Hand Grip (lbs)  32    Right Hand Lateral Pinch  15 lbs    Right Hand 3 Point Pinch  16 lbs    Left Hand Grip (lbs)  29    Left Hand Lateral Pinch  11 lbs    Left Hand 3 Point Pinch  11 lbs        PREE pain score 20/50 PREE function score 15/50 Updated goals to reflect progress Measurements taken for ROM and strength as outlined in above flow sheet Patient able to lift 1/2 gallon of milk now   Elbow AROM compare to R - same - extension all planes for elbow  Strength for flexion and extension 4/5   Continue with  2 lbs weight - but HEP to do in supine -and do punches up to ceiling - extending that elbow  And then shoulder 90 degrees flexion -and flex and extend elbow with 2 lbs weight  Still unable to perform 3# for elbow extension in supine 3 sets of 10 reps  Cont withdark green firm toteal putty to HEP for grip  Upgraded to 3 sets12 toget fingers into putty  Cont withWall pushupdoing well with per pt - 3 sets of 10-12pain free Pt to keep hands on shoulder height  Upgrade to Green TB ( 5lbs) for scapula squeezes  And shoulder extension 12 reps Effort level 6/10 - pt can do 1 sets of 12 and add in 3 days 2 sets ;6-7 days 3 sets if no issues  Elbow extension 12 reps - greenTB at top of door - pt need cueing to keep elbow Adducted  2 sets of 12   able to lift and carry 6# without any compensation at the UE.           OT Education - 03/28/20 1655    Education Details  progress and HEP changes    Person(s) Educated  Patient    Methods  Explanation;Demonstration;Tactile cues;Verbal cues;Handout    Comprehension  Verbal cues required;Returned demonstration;Verbalized understanding       OT Short Term Goals - 02/21/20 1619      OT SHORT TERM GOAL #1   Title  Pt to be independent in HEP to increase L elbow extention to -5 and  increase strenght to carry more than 5 lbs    Baseline  elbow extention AROM -5 and PROM and in session 0 - carry and lift 5 lbs with ease  Status  Achieved        OT Long Term Goals - 03/27/20 1444      OT LONG TERM GOAL #1   Title  L Elbow extention improve for pt to pull shirt over head, push up and carry more than 8 lbs without increase symptoms    Baseline  0 to -5 for extention , upgrade to 3 lbs for elbow HEP , no pain reported but can carry and lift 5 lbs - but more compensate with body  has some discomfort.  03/27/2020 able to carry up to 6# and able to pull shirt overhead.    Time  4    Period  Weeks    Status  On-going      OT LONG TERM GOAL #2   Title  Pt AROM and strength in L elbow improve to prior level for function    Baseline  can do wall pushups , dress , bath, - no pushing up out of bed or chair , groceries, laundery and fitting sheet still favor.  03-27-20 she is still doing wall push ups, helps with meal prep but husband cooks, shopping, some difficulty with fitted sheet.    Time  4    Period  Weeks    Status  On-going            Plan - 03/28/20 1656    Clinical Impression Statement  Patient continues to progress with ROM and strength of Left elbow and UE.  She is able to lift up to 6# to lift and carry without compensation and no pain.  Increased repetitions of grip strengthening with putty and now up to 3 sets of green theraband exercises.  Continue with home exercise program and follow up biweekly for reassessment and changes to HEP.  Patient continues to benefit from skilled OT services to maximize safety and independence in daily tasks.    OT Occupational Profile and History  Problem Focused Assessment - Including review of records relating to presenting problem    Occupational performance deficits (Please refer to evaluation for details):  ADL's;IADL's;Play;Leisure;Social Participation    Body Structure / Function / Physical Skills  ADL;Flexibility;ROM;UE  functional use;Pain;Strength;IADL    Rehab Potential  Good    Clinical Decision Making  Limited treatment options, no task modification necessary    Comorbidities Affecting Occupational Performance:  May have comorbidities impacting occupational performance    Modification or Assistance to Complete Evaluation   No modification of tasks or assist necessary to complete eval    OT Frequency  1x / week    OT Duration  4 weeks    OT Treatment/Interventions  Self-care/ADL training;Therapeutic exercise;Patient/family education;Cryotherapy;Paraffin;Moist Heat;Manual Therapy;Passive range of motion;Therapeutic activities    Plan  assess progress with HEP    OT Home Exercise Plan  see pt instruction    Consulted and Agree with Plan of Care  Patient       Patient will benefit from skilled therapeutic intervention in order to improve the following deficits and impairments:   Body Structure / Function / Physical Skills: ADL, Flexibility, ROM, UE functional use, Pain, Strength, IADL       Visit Diagnosis: Muscle weakness (generalized)  Difficulty in walking, not elsewhere classified  Stiffness of left elbow, not elsewhere classified  Pain in left elbow    Problem List Patient Active Problem List   Diagnosis Date Noted  . Parkinson disease ([place]) 02/22/2020  . 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 Bipolar disorder, in full remission,  most recent episode depressed ([place]) 11/08/2019  . Avitaminosis D 08/24/2019  . Insomnia due to mental condition 05/31/2019  . Bipolar I disorder, most recent episode depressed ([place]) 05/31/2019  . Mild cognitive disorder 05/31/2019  . Alcohol use disorder, moderate, in sustained remission ([place]) 05/31/2019  . Elevated tumor markers 05/27/2019  . GAD (generalized anxiety disorder) 04/10/2019  . Altered mental status 04/10/2019  . MDD (major depressive disorder), severe ([place]) 01/31/2019  . High serum carbohydrate antigen 19-9 (CA19-9) 12/01/2018  . Adjustment  disorder with anxious mood 11/24/2018  . Essential hypertension 11/24/2018  . B12 deficiency 10/17/2018  . Incisional hernia, without obstruction or gangrene 09/12/2018  . Genetic testing 04/07/2018  . Family history of lung cancer 03/24/2018  . Malignant neoplasm of upper-outer quadrant of right breast in female, estrogen receptor positive ([place]) 03/17/2018  . Malignant neoplasm of upper-inner quadrant of right breast in female, estrogen receptor positive ([place]) 03/17/2018  . History of alcohol dependence ([place]) 02/07/2018  . History of psychosis 02/07/2018  . History of insomnia 02/07/2018   Maziah Keeling T Vladimir Lenhoff, OTR/L, CLT  Chyrl Elwell 03/28/2020, 5:11 PM  [facility] [address] [place], [address] Phone: [phone]   Fax:  [phone]  Name: Kendra [name] MRN: [id number] Date of Birth: [birth year]

## 2020-03-29 NOTE — Progress Notes (Signed)
Virtual Visit via Video Note  I connected with Kendra [name] on 03/28/20 at  3:00 PM EDT by a video enabled telemedicine application and verified that I am speaking with the correct person using two identifiers.  Location: Patient: home Provider: [facility]   I discussed the limitations of evaluation and management by telemedicine and the availability of in person appointments. The patient expressed understanding and agreed to proceed.   I discussed the assessment and treatment plan with the patient. The patient was provided an opportunity to ask questions and all were answered. The patient agreed with the plan and demonstrated an understanding of the instructions.   The patient was advised to call back or seek an in-person evaluation if the symptoms worsen or if the condition fails to improve as anticipated.   THERAPIST PROGRESS NOTE  Session Time: 60 min  Participation Level: Active  Behavioral Response: NeatAlertDepressed  Type of Therapy: Individual Therapy  Treatment Goals addressed: Coping and Diagnosis: depression, anxiety  Interventions: CBT and Supportive  Summary: Kendra [name] is a 74 y.o. female who presents with depression and anxiety. Discussed with LCSW thoughts and feelings associated with recent medical diagnosis. Reviewed with LCSW stress management and mindfulness techniques.   Suicidal/Homicidal: Nowithout intent/plan  Therapist Response: Pt progressing towards goals set in the past. LCSW to review goals and treatment plan at next session.  Plan: Return again in 3 weeks.  Diagnosis: Axis I: Bipolar, Depressed    Axis II: No diagnosis    [place], LCSW 03/29/2020

## 2020-04-09 ENCOUNTER — Encounter: Payer: Self-pay | Admitting: Psychiatry

## 2020-04-09 ENCOUNTER — Other Ambulatory Visit: Payer: Self-pay

## 2020-04-09 ENCOUNTER — Telehealth (INDEPENDENT_AMBULATORY_CARE_PROVIDER_SITE_OTHER): Payer: Medicare PPO | Admitting: Psychiatry

## 2020-04-09 DIAGNOSIS — F09 Unspecified mental disorder due to known physiological condition: Secondary | ICD-10-CM | POA: Diagnosis not present

## 2020-04-09 DIAGNOSIS — F411 Generalized anxiety disorder: Secondary | ICD-10-CM

## 2020-04-09 DIAGNOSIS — F5105 Insomnia due to other mental disorder: Secondary | ICD-10-CM

## 2020-04-09 DIAGNOSIS — F3130 Bipolar disorder, current episode depressed, mild or moderate severity, unspecified: Secondary | ICD-10-CM | POA: Diagnosis not present

## 2020-04-09 MED ORDER — TRAZODONE HCL 50 MG PO TABS: 25.0000 mg | ORAL_TABLET | Freq: Every evening | ORAL | 1 refills | Status: DC | PRN

## 2020-04-09 NOTE — Progress Notes (Signed)
Provider Location : [facility] Patient Location : Home  Virtual Visit via Telephone Note  I connected with [name] on 04/09/20 at  3:30 PM EDT by telephone and verified that I am speaking with the correct person using two identifiers.   I discussed the limitations, risks, security and privacy concerns of performing an evaluation and management service by telephone and the availability of in person appointments. I also discussed with the patient that there may be a patient responsible charge related to this service. The patient expressed understanding and agreed to proceed.    I discussed the assessment and treatment plan with the patient. The patient was provided an opportunity to ask questions and all were answered. The patient agreed with the plan and demonstrated an understanding of the instructions.   The patient was advised to call back or seek an in-person evaluation if the symptoms worsen or if the condition fails to improve as anticipated.   [facility] MD OP Progress Note  04/09/2020 4:18 PM Darcee Dekker  MRN:  [id number]  Chief Complaint:  Chief Complaint    Follow-up     HPI: Kendra [name] is a 74 year old Caucasian female, married, lives in [place], has a history of bipolar disorder, GAD, insomnia, cognitive disorder, multifocal stage I right breast cancer status post treatment-lumpectomy, ovarian cyst removal, Parkinson's disease, was evaluated by phone today.  Patient preferred to do a phone call.  Patient today reports she is currently making progress with regards to her mood.  She was able to reduce the dosage of risperidone as discussed last visit.  She reports she is tolerating it well.  She has not noticed any recurrence of mood swings since being on a lower dosage.  She reports the current combination of medications is helpful to keep her calm during the day.  She reports sleep is good.  She continues to struggle with Parkinson's disease symptoms and is currently  under the care of neurology.  Patient reports it is a struggle to get used to this new diagnosis.  She is currently following up with therapist and reports that has been very beneficial.  Patient denies any suicidality, homicidality or perceptual disturbances.  Patient continues to have support system from her husband.    Visit Diagnosis:    ICD-10-CM   1. Bipolar I disorder, most recent episode depressed (Kendra [name])  F31.30   2. GAD (generalized anxiety disorder)  F41.1   3. Insomnia due to mental condition  F51.05 traZODone (DESYREL) 50 MG tablet  4. Mild cognitive disorder  F09     Past Psychiatric History: I have reviewed past psychiatric history from my progress note on 03/01/2019.  Past trials of Prozac, risperidone, Seroquel, Ativan  Past Medical History:  Past Medical History:  Diagnosis Date  . Anxiety   . Breast cancer ([place]) 03/2018   right breast cancer at 11:00 and 1:00  . Bursitis    bilateral hips and knees  . Complication of anesthesia   . Depression   . Family history of breast cancer 03/24/2018  . Family history of lung cancer 03/24/2018  . History of alcohol dependence ([place]) 2007   no ETOH; resolved since 2007  . History of hiatal hernia   . History of psychosis 2014   due to sleep disturbance  . Hypertension   . Personal history of radiation therapy 06/2018-07/2018   right breast ca  . PONV (postoperative nausea and vomiting)     Past Surgical History:  Procedure Laterality Date  . BREAST  BIOPSY Right 03/14/2018   11:00 DCIS and invasive ductal carcinoma  . BREAST BIOPSY Right 03/14/2018   1:00 Invasive ductal carcinoma  . BREAST LUMPECTOMY Right 04/27/2018   lumpectomy of 11 and 1:00 cancers, clear margins, negative LN  . BREAST LUMPECTOMY WITH RADIOACTIVE SEED AND SENTINEL LYMPH NODE BIOPSY Right 04/27/2018   Procedure: RIGHT BREAST LUMPECTOMY WITH RADIOACTIVE SEED X 2 AND RIGHT SENTINEL LYMPH NODE BIOPSY;  Surgeon: [name], MD;  Location: [facility];  Service: General;  Laterality: Right;  . CATARACT EXTRACTION W/PHACO Left 08/30/2019   Procedure: CATARACT EXTRACTION PHACO AND INTRAOCULAR LENS PLACEMENT ([place]) LEFT panoptix toric  01:20.5  12.7%  10.26;  Surgeon: Leandrew Koyanagi, MD;  Location: [facility];  Service: Ophthalmology;  Laterality: Left;  . CATARACT EXTRACTION W/PHACO Right 09/20/2019   Procedure: CATARACT EXTRACTION PHACO AND INTRAOCULAR LENS PLACEMENT ([facility]) RIGHT 5.71  00:36.4  15.7%;  Surgeon: Leandrew Koyanagi, MD;  Location: [facility];  Service: Ophthalmology;  Laterality: Right;  . LAPAROTOMY N/A 02/10/2018   Procedure: EXPLORATORY LAPAROTOMY;  Surgeon: Isabel Caprice, MD;  Location[facility];  Service: Gynecology;  Laterality: N/A;  . MASS EXCISION  01/2018   abdominal  . OVARIAN CYST REMOVAL    . SALPINGOOPHORECTOMY Bilateral 02/10/2018   Procedure: BILATERAL SALPINGO OOPHORECTOMY; PERITONEAL WASHINGS;  Surgeon: Isabel Caprice, MD;  [facility];  Service: Gynecology;  Laterality: Bilateral;  . TONSILLECTOMY    . TONSILLECTOMY AND ADENOIDECTOMY  1952    Family Psychiatric History: I have reviewed family psychiatric history from my progress note on 03/01/2019  Family History:  Family History  Problem Relation Age of Onset  . Diabetes Mother   . Breast cancer Mother 81  . Hypertension Mother   . Lung cancer Father        asbestos exposure  . Heart disease Brother 29  . Breast cancer Cousin 33       paternal cousin  . Heart disease Paternal Grandmother 61  . Heart disease Paternal Grandfather     Social History: Reviewed social history from my progress note on 03/01/2019 Social History   Socioeconomic History  . Marital status: Married    Spouse name: robert  . Number of children: 0  . Years of education: Not on file  . Highest education level: Some college, no degree  Occupational History  . Occupation: retired Furniture conservator/restorer of education   Tobacco Use  . Smoking status: Never Smoker  . Smokeless tobacco: Never Used  Substance and Sexual Activity  . Alcohol use: Not Currently    Comment: alcohol dependence prior to 2007  . Drug use: Never  . Sexual activity: Yes    Partners: Male    Birth control/protection: Surgical  Other Topics Concern  . Not on file  Social History Narrative  . Not on file   Social Determinants of Health   Financial Resource Strain:   . Difficulty of Paying Living Expenses:   Food Insecurity:   . Worried About Charity fundraiser in the Last Year:   . Arboriculturist in the Last Year:   Transportation Needs:   . Film/video editor (Medical):   Marland Kitchen Lack of Transportation (Non-Medical):   Physical Activity:   . Days of Exercise per Week:   . Minutes of Exercise per Session:   Stress:   . Feeling of Stress :   Social Connections:   . Frequency of Communication with Friends and  Family:   . Frequency of Social Gatherings with Friends and Family:   . Attends Religious Services:   . Active Member of Clubs or Organizations:   . Attends Archivist Meetings:   Marland Kitchen Marital Status:     Allergies:  Allergies  Allergen Reactions  . Hydroxyzine Anaphylaxis    Tongue swollen  . Other Other (See Comments)    Food poisoning   . Shrimp [Shellfish Allergy] Other (See Comments)    Food poisoning     Metabolic Disorder Labs: Lab Results  Component Value Date   HGBA1C 5.4 04/13/2019   Lab Results  Component Value Date   PROLACTIN 101.9 06/20/2015   Lab Results  Component Value Date   CHOL 173 04/13/2019   TRIG 78 04/13/2019   HDL 62 04/13/2019   LDLCALC 95 04/13/2019   LDLCALC 103 06/02/2017   Lab Results  Component Value Date   TSH 2.31 04/13/2019   TSH 2.543 07/25/2018    Therapeutic Level Labs: No results found for: LITHIUM No results found for: VALPROATE No components found for:  CBMZ  Current Medications: Current Outpatient Medications  Medication Sig  Dispense Refill  . Acetaminophen 500 MG capsule     . calcium citrate-vitamin D (CITRACAL+D) 315-200 MG-UNIT tablet Take 4 tablets by mouth daily.    . carbidopa-levodopa (SINEMET IR) 25-100 MG tablet Take 0.5 tablets by mouth in the morning, at noon, and at bedtime.    . cephALEXin (KEFLEX) 500 MG capsule Take 1 capsule (500 mg total) by mouth 3 (three) times daily. 15 capsule 0  . cyanocobalamin (,VITAMIN B-12,) 1000 MCG/ML injection Inject 1 mL (1,000 mcg total) into the muscle every 30 (thirty) days. 3 mL 3  . FLUoxetine HCl 60 MG TABS Take 60 mg by mouth daily. 90 tablet 1  . ketoconazole (NIZORAL) 2 % cream Apply 1 application topically daily. 15 g 0  . letrozole (FEMARA) 2.5 MG tablet Take 1 tablet (2.5 mg total) by mouth daily. 90 tablet 3  . lisinopril-hydrochlorothiazide (ZESTORETIC) 20-12.5 MG tablet Take 1 tablet by mouth daily. 90 tablet 3  . polyethylene glycol (MIRALAX / GLYCOLAX) 17 g packet Take 17 g by mouth daily.    . risperiDONE (RISPERDAL) 2 MG tablet Take 0.5 tablets (1 mg total) by mouth 2 (two) times daily. 90 tablet 1  . traZODone (DESYREL) 50 MG tablet Take 0.5-1 tablets (25-50 mg total) by mouth at bedtime as needed for sleep. 90 tablet 1   Current Facility-Administered Medications  Medication Dose Route Frequency Provider Last Rate Last Admin  . lidocaine (PF) (XYLOCAINE) 1 % injection 4 mL  4 mL Intradermal Once Virginia Crews, MD      . lidocaine (PF) (XYLOCAINE) 1 % injection 4 mL  4 mL Intradermal Once Virginia Crews, MD      . methylPREDNISolone acetate (DEPO-MEDROL) injection 40 mg  40 mg Intramuscular Once Virginia Crews, MD      . methylPREDNISolone acetate (DEPO-MEDROL) injection 40 mg  40 mg Intramuscular Once Brita Romp Dionne Bucy, MD         Musculoskeletal: Strength & Muscle Tone: [place]: UTA Patient leans: N/A  Psychiatric Specialty Exam: Review of Systems  Neurological: Positive for tremors.  All other systems  reviewed and are negative.   There were no vitals taken for this visit.There is no height or weight on file to calculate BMI.  General Appearance: UTA  Eye Contact:  UTA  Speech:  Clear and  Coherent  Volume:  Normal  Mood:  Euthymic  Affect:  UTA  Thought Process:  Goal Directed and Descriptions of Associations: Intact  Orientation:  Full (Time, Place, and Person)  Thought Content: Logical   Suicidal Thoughts:  No  Homicidal Thoughts:  No  Memory:  Immediate;   Fair Recent;   Fair Remote;   Fair  Judgement:  Fair  Insight:  Fair  Psychomotor Activity:  UTA  Concentration:  Concentration: Fair and Attention Span: Fair  Recall:  [employer] of Knowledge: Fair  Language: Fair  Akathisia:  No  Handed:  Right  AIMS (if indicated):UTA  Assets:  Communication Skills Desire for Improvement Housing Social Support  ADL's:  Intact  Cognition: WNL  Sleep:  Fair   Screenings: GAD-7     Counselor 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 [facility]  Total GAD-7 Score  7    PHQ2-9     Counselor 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 [facility] Office Visit from 11/24/2018 in [facility]  PHQ-2 Total Score  3  0  PHQ-9 Total Score  17  3       Assessment and Plan: Kendra [name] is a 74 year old Caucasian female, married, lives in [place], has a history of bipolar disorder, GAD, insomnia, multiple medical problems, recent diagnosis of primary Parkinson's disease, recent fall was evaluated by phone today.  Patient is biologically predisposed given her multiple health issues.  Patient with psychosocial stressors of the recent diagnosis of primary Parkinson's disease and the current pandemic.  Patient however is currently doing well on the current medication regimen as well as psychotherapy sessions.  Plan as noted below.  Plan Bipolar disorder in remission Risperidone at reduced dosage of 2 mg p.o. daily in divided dosage Prozac 60 mg p.o.  daily.   GAD-stable Continue CBT as needed Continue Prozac as prescribed  Insomnia-stable Trazodone 25 to 50 mg p.o. nightly as needed Continue physical therapy sessions for pain, which can also affect her sleep.  She reports pain is more under control now. Continue melatonin as needed at bedtime for sleep  For cognitive changes-continue follow-up with neurology.  Follow-up in clinic in 2 months or sooner if needed.  I have spent atleast 20 minutes non face to face with patient today. More than 50 % of the time was spent for preparing to see the patient ( e.g., review of test, records ),  ordering medications and test ,psychoeducation and supportive psychotherapy and care coordination,as well as documenting clinical information in electronic health record. This note was generated in part or whole with voice recognition software. Voice recognition is usually quite accurate but there are transcription errors that can and very often do occur. I apologize for any typographical errors that were not detected and corrected.         Ursula Alert, MD 04/09/2020, 4:18 PM

## 2020-04-11 ENCOUNTER — Other Ambulatory Visit: Payer: Self-pay

## 2020-04-11 ENCOUNTER — Ambulatory Visit: Payer: Medicare PPO | Attending: Family Medicine

## 2020-04-11 DIAGNOSIS — R262 Difficulty in walking, not elsewhere classified: Secondary | ICD-10-CM

## 2020-04-11 DIAGNOSIS — R531 Weakness: Secondary | ICD-10-CM | POA: Insufficient documentation

## 2020-04-11 DIAGNOSIS — M25622 Stiffness of left elbow, not elsewhere classified: Secondary | ICD-10-CM | POA: Diagnosis not present

## 2020-04-11 DIAGNOSIS — M25522 Pain in left elbow: Secondary | ICD-10-CM | POA: Insufficient documentation

## 2020-04-11 DIAGNOSIS — M6281 Muscle weakness (generalized): Secondary | ICD-10-CM | POA: Diagnosis not present

## 2020-04-11 NOTE — Therapy (Signed)
[facility] [address] [place], [address] Phone: [phone]   Fax:  [phone]  Physical Therapy Treatment  Patient Details  Name: Kendra [name] MRN: [id number] Date of Birth: 1946-10-10 No data recorded  Encounter Date: 04/11/2020   PT End of Session - 04/11/20 1704    Visit Number 40    Number of Visits 50    Authorization Type 3/10    Authorization Time Period until 06/12/2020    PT Start Time 1605    PT Stop Time 1650    PT Time Calculation (min) 45 min    Equipment Utilized During Treatment Gait belt    Behavior During Therapy WFL for tasks assessed/performed           Past Medical History:  Diagnosis Date  . Anxiety   . Breast cancer ([facility]) 03/2018   right breast cancer at 11:00 and 1:00  . Bursitis    bilateral hips and knees  . Complication of anesthesia   . Depression   . Family history of breast cancer 03/24/2018  . Family history of lung cancer 03/24/2018  . History of alcohol dependence ([facility]) 2007   no ETOH; resolved since 2007  . History of hiatal hernia   . History of psychosis 2014   due to sleep disturbance  . Hypertension   . Personal history of radiation therapy 06/2018-07/2018   right breast ca  . PONV (postoperative nausea and vomiting)     Past Surgical History:  Procedure Laterality Date  . BREAST BIOPSY Right 03/14/2018   11:00 DCIS and invasive ductal carcinoma  . BREAST BIOPSY Right 03/14/2018   1:00 Invasive ductal carcinoma  . BREAST LUMPECTOMY Right 04/27/2018   lumpectomy of 11 and 1:00 cancers, clear margins, negative LN  . BREAST LUMPECTOMY WITH RADIOACTIVE SEED AND SENTINEL LYMPH NODE BIOPSY Right 04/27/2018   Procedure: RIGHT BREAST LUMPECTOMY WITH RADIOACTIVE SEED X 2 AND RIGHT SENTINEL LYMPH NODE BIOPSY;  Surgeon: [name], MD;  Location: [facility];  Service: General;  Laterality: Right;  . CATARACT EXTRACTION W/PHACO Left  08/30/2019   Procedure: CATARACT EXTRACTION PHACO AND INTRAOCULAR LENS PLACEMENT ([facility]) LEFT panoptix toric  01:20.5  12.7%  10.26;  Surgeon: Leandrew Koyanagi, MD;  Location: [facility];  Service: Ophthalmology;  Laterality: Left;  . CATARACT EXTRACTION W/PHACO Right 09/20/2019   Procedure: CATARACT EXTRACTION PHACO AND INTRAOCULAR LENS PLACEMENT ([facility]) RIGHT 5.71  00:36.4  15.7%;  Surgeon: Leandrew Koyanagi, MD;  Location: [facility];  Service: Ophthalmology;  Laterality: Right;  . LAPAROTOMY N/A 02/10/2018   Procedure: EXPLORATORY LAPAROTOMY;  Surgeon: Isabel Caprice, MD;  [facility];  Service: Gynecology;  Laterality: N/A;  . MASS EXCISION  01/2018   abdominal  . OVARIAN CYST REMOVAL    . SALPINGOOPHORECTOMY Bilateral 02/10/2018   Procedure: BILATERAL SALPINGO OOPHORECTOMY; PERITONEAL WASHINGS;  Surgeon: Isabel Caprice, MD;  [facility];  Service: Gynecology;  Laterality: Bilateral;  . TONSILLECTOMY    . TONSILLECTOMY AND ADENOIDECTOMY  1953    There were no vitals filed for this visit.   Subjective Assessment - 04/11/20 1610    Subjective Pt reports she has not had any falls since last session.  She is still having some dizziness and she "feels off balance."    She is not having any pain today which she is pleased about.    Pertinent History Patient report unclear regarding onset. Visited MD on Sept 16  and received injection with relief for approximately 1 month. Reports she cannot receive an additional injection pending eye surgery. Received dx from MD of hip bursitis in B hips. Reports pain with walking, stanindg, sitting 5/10 current 7/10 worst 3/10 best, with temporary relief when she is sitting "just right" and with naproxen. Agg: activity. Cormorbidities: hx of breast cancer, HTN, MCI, insomnia, cataracts    Patient Stated Goals Be able to move without hurting, walk 1 hour    Currently in Pain? No/denies           Treatment Today:     Therapeutic Exercise 4 -way seated reaches forward, down, up, and backward -x 10  Ball tosses in sitting with eye following with head turns - 2 x 10 Sit to stand holding ball, overhead reach and follow with eyes- 2x10 Seated trunk/bilateral shoulder D2 holding ball- focusing on trunk rot and tracking ball with eyes- 2x10 R and L  Gait belt donned and PT SBA for standing exercises: Standing feet together, look up/down, look right/left with PT verbal cue to change direction every 2 sec.  Ambulation with head turns up/down - x 292ft  Ambulation with head turns left/right - x 277ft Standing alt marches x15 ea   Performed exercises to improve hip strength and coordination overall                     PT Education - 04/11/20 1704    Education provided Yes    Education Details reviewed her current HEP today    Person(s) Educated Patient    Methods Demonstration    Comprehension Returned demonstration            PT Short Term Goals - 09/12/19 1419      PT SHORT TERM GOAL #1   Title Patient will demonstrate complaince with HEP to speed recovery and reduce total number of visits.    Baseline HEP given    Time 2    Period Weeks    Status New    Target Date 09/27/19             PT Long Term Goals - 03/20/20 1355      PT LONG TERM GOAL #1   Title Patient will improve hip strength to 4/5 grossly to facilitate improved mobility with ADLs.    Baseline 3, 4-; 12/22 deferred to NV; 11/09/2019: 4-/5; 12/07/2019 L hip flexor 4, abd and ext 3+; 01/29/2020: 4/5 for most hip motions, 3+/5 for abd, ext; 03/20/2020: 4/5 for hip movements    Time 8    Period Weeks    Status On-going      PT LONG TERM GOAL #2   Title Patient will demonstrate walking tolerance increased to 30 minutes without increase in symptoms to demonstrate improved ability to perform walking program for cardiovascular health.    Baseline 10 minute tolerance (patient report); 12/22 10 minutes (per pt  report); 11/09/2019: 7 min; 12/07/2019 unable to assess due to icy streets;  01/29/2020: 10 min; 03/20/2020    Time 8    Period Weeks    Status On-going      PT LONG TERM GOAL #3   Title Patient will report worst pain of 3/10 during therapy to demonstrate reduced disability    Baseline Worst 7/10; 12/22 worst 9/10; 11/09/2019: 9/10; 12/07/2019 7/10; 01/29/2020: 7/10    Time 8    Period Weeks    Status On-going      PT LONG TERM GOAL #4  Title Patient will demonstrate ability to perform all bed mobility maneuvers without increase in pain for reduced disability with mobility and transfers with ADLs.    Baseline Reports pain with bed mobility supine <> sidelying L/R, sidelying to sitting EOB; 12/22 pt able to perform I without increased pain    Time 8    Period Weeks    Status Achieved      PT LONG TERM GOAL #5   Title Patient will improve 10MW speed to 1.0 m/sec to meet norms for minimal fall risk and full community ambulation.    Baseline 0.88 m/sec; 12/22 0.96 m/sec; 11/09/2019: deferred; 12/07/2019 0.90 m/sec; 01/29/2020: .9  m/sec; 03/20/2020: .76m/s    Time 8    Period Weeks    Status On-going      PT LONG TERM GOAL #6   Title Patient will increase 6MWT by 200 ft to demonstrate improvement in functional capacity for transition to cardiac walking program    Baseline 6MWT = 1195; 1/7/20201: deferred; 12/07/2019 1095; 01/29/2020: deferred secondary to fatigue; 03/20/2020: 1057ft    Time 8    Period Weeks    Status Deferred      PT LONG TERM GOAL #7   Title Patient will improve her 5xsts to under 15 sec to indicate singificnat improvement in LE functional strength and decrease in fall risk.    Baseline 24.5 sec; 03/20/2020: 19.19 sec    Time 6    Period Weeks    Status On-going                 Plan - 04/11/20 1706    Clinical Impression Statement Pt continues to have difficulty with balance dynamic gait activities.  She had a few small losses of balance during amb with head turns/look up  and down exercises.  Overall, she was motivated to participate in session and she tolerated tx session well.    Personal Factors and Comorbidities Age;Comorbidity 3+;Time since onset of injury/illness/exacerbation;Comorbidity 2    Comorbidities MCI, HTN, CA, cataracts    Examination-Activity Limitations Bend;Sit;Squat;Stairs;Stand;Sleep    Examination-Participation Restrictions Cleaning;Community Activity    Stability/Clinical Decision Making Evolving/Moderate complexity    Rehab Potential Fair    Clinical Impairments Affecting Rehab Potential MCI    PT Frequency 2x / week    PT Duration 6 weeks    PT Treatment/Interventions ADLs/Self Care Home Management;Therapeutic exercise;Patient/family education;Neuromuscular re-education;Therapeutic activities;Functional mobility training;Balance training;Manual techniques;Gait training;Stair training;Moist Heat;Electrical Stimulation;Cryotherapy;Taping;Dry needling;Energy conservation;Passive range of motion;Joint Manipulations    PT Next Visit Plan Progress glute therex, pelvic-hip dissociation; check and progress HEP with stretches    PT Home Exercise Plan lateral step ups, step ups, hip kickers ext/abd red theraband    Consulted and Agree with Plan of Care Patient           Patient will benefit from skilled therapeutic intervention in order to improve the following deficits and impairments:  Decreased range of motion, Postural dysfunction, Pain, Abnormal gait, Decreased activity tolerance, Decreased coordination, Decreased endurance, Decreased balance, Decreased mobility, Difficulty walking, Hypomobility, Increased muscle spasms, Impaired perceived functional ability, Decreased strength, Impaired flexibility  Visit Diagnosis: Muscle weakness (generalized)  Difficulty in walking, not elsewhere classified     Problem List Patient Active Problem List   Diagnosis Date Noted  . Parkinson disease ([place] [place]) 02/22/2020  . Mucinous cystadenoma  12/24/2019  . Bipolar disorder, in full remission, most recent episode depressed ([place]) 11/08/2019  . Avitaminosis D 08/24/2019  . Insomnia due to mental condition 05/31/2019  .  Bipolar I disorder, most recent episode depressed ([place]) 05/31/2019  . Mild cognitive disorder 05/31/2019  . Alcohol use disorder, moderate, in sustained remission ([place]) 05/31/2019  . Elevated tumor markers 05/27/2019  . GAD (generalized anxiety disorder) 04/10/2019  . Altered mental status 04/10/2019  . MDD (major depressive disorder), severe ([place]) 01/31/2019  . High serum carbohydrate antigen 19-9 (CA19-9) 12/01/2018  . Adjustment disorder with anxious mood 11/24/2018  . Essential hypertension 11/24/2018  . B12 deficiency 10/17/2018  . Incisional hernia, without obstruction or gangrene 09/12/2018  . Genetic testing 04/07/2018  . Family history of lung cancer 03/24/2018  . Malignant neoplasm of upper-outer quadrant of right breast in female, estrogen receptor positive ([place]) 03/17/2018  . Malignant neoplasm of upper-inner quadrant of right breast in female, estrogen receptor positive ([place]) 03/17/2018  . History of alcohol dependence ([facility]) 02/07/2018  . History of psychosis 02/07/2018  . History of insomnia 02/07/2018    Pincus Badder 04/11/2020, 5:09 PM Merdis Delay, PT, DPT   [facility] [address] [place], [address] Phone: [phone]   Fax:  [phone]  Name: Kendra [name] MRN: [id number] Date of Birth: December 01, 1945

## 2020-04-15 DIAGNOSIS — E538 Deficiency of other specified B group vitamins: Secondary | ICD-10-CM | POA: Diagnosis not present

## 2020-04-15 DIAGNOSIS — G20 Parkinson's disease: Secondary | ICD-10-CM | POA: Diagnosis not present

## 2020-04-17 ENCOUNTER — Encounter: Payer: Self-pay | Admitting: Physical Therapy

## 2020-04-17 ENCOUNTER — Ambulatory Visit: Payer: Medicare PPO | Admitting: Physical Therapy

## 2020-04-17 ENCOUNTER — Other Ambulatory Visit: Payer: Self-pay

## 2020-04-17 VITALS — BP 118/70

## 2020-04-17 DIAGNOSIS — M25522 Pain in left elbow: Secondary | ICD-10-CM | POA: Diagnosis not present

## 2020-04-17 DIAGNOSIS — M25622 Stiffness of left elbow, not elsewhere classified: Secondary | ICD-10-CM | POA: Diagnosis not present

## 2020-04-17 DIAGNOSIS — M6281 Muscle weakness (generalized): Secondary | ICD-10-CM | POA: Diagnosis not present

## 2020-04-17 DIAGNOSIS — R262 Difficulty in walking, not elsewhere classified: Secondary | ICD-10-CM | POA: Diagnosis not present

## 2020-04-17 DIAGNOSIS — R531 Weakness: Secondary | ICD-10-CM | POA: Diagnosis not present

## 2020-04-17 NOTE — Therapy (Signed)
[facility] [address], [place], [address] Phone: [phone]   Fax:  [phone]  Physical Therapy Treatment  Patient Details  Name: Kendra [name] MRN: [id number] Date of Birth: April 25, 1946 No data recorded  Encounter Date: 04/17/2020   PT End of Session - 04/18/20 1132    Visit Number 41    Number of Visits 50    Authorization Type 3/10    Authorization Time Period until 06/12/2020    PT Start Time 1430    PT Stop Time 1510    PT Time Calculation (min) 40 min    Equipment Utilized During Treatment Gait belt    Behavior During Therapy WFL for tasks assessed/performed           Past Medical History:  Diagnosis Date  . Anxiety   . Breast cancer ([place]) 03/2018   right breast cancer at 11:00 and 1:00  . Bursitis    bilateral hips and knees  . Complication of anesthesia   . Depression   . Family history of breast cancer 03/24/2018  . Family history of lung cancer 03/24/2018  . History of alcohol dependence ([place]) 2007   no ETOH; resolved since 2007  . History of hiatal hernia   . History of psychosis 2014   due to sleep disturbance  . Hypertension   . Personal history of radiation therapy 06/2018-07/2018   right breast ca  . PONV (postoperative nausea and vomiting)     Past Surgical History:  Procedure Laterality Date  . BREAST BIOPSY Right 03/14/2018   11:00 DCIS and invasive ductal carcinoma  . BREAST BIOPSY Right 03/14/2018   1:00 Invasive ductal carcinoma  . BREAST LUMPECTOMY Right 04/27/2018   lumpectomy of 11 and 1:00 cancers, clear margins, negative LN  . BREAST LUMPECTOMY WITH RADIOACTIVE SEED AND SENTINEL LYMPH NODE BIOPSY Right 04/27/2018   Procedure: RIGHT BREAST LUMPECTOMY WITH RADIOACTIVE SEED X 2 AND RIGHT SENTINEL LYMPH NODE BIOPSY;  Surgeon: [name], MD;  Location: [facility];  Service: General;  Laterality: Right;  . CATARACT EXTRACTION W/PHACO Left  08/30/2019   Procedure: CATARACT EXTRACTION PHACO AND INTRAOCULAR LENS PLACEMENT ([place]) LEFT panoptix toric  01:20.5  12.7%  10.26;  Surgeon: Leandrew Koyanagi, MD;  Location: [facility];  Service: Ophthalmology;  Laterality: Left;  . CATARACT EXTRACTION W/PHACO Right 09/20/2019   Procedure: CATARACT EXTRACTION PHACO AND INTRAOCULAR LENS PLACEMENT ([facility]) RIGHT 5.71  00:36.4  15.7%;  Surgeon: Leandrew Koyanagi, MD;  Location: [facility];  Service: Ophthalmology;  Laterality: Right;  . LAPAROTOMY N/A 02/10/2018   Procedure: EXPLORATORY LAPAROTOMY;  Surgeon: Isabel Caprice, MD;  [facility];  Service: Gynecology;  Laterality: N/A;  . MASS EXCISION  01/2018   abdominal  . OVARIAN CYST REMOVAL    . SALPINGOOPHORECTOMY Bilateral 02/10/2018   Procedure: BILATERAL SALPINGO OOPHORECTOMY; PERITONEAL WASHINGS;  Surgeon: Isabel Caprice, MD;  [facility];  Service: Gynecology;  Laterality: Bilateral;  . TONSILLECTOMY    . TONSILLECTOMY AND ADENOIDECTOMY  1953    Vitals:   04/17/20 1437  BP: 118/70     Subjective Assessment - 04/17/20 1434    Subjective Patinet reports no pain upon arrival and no falls since last session. She reports her levodopa was increased and she felt better last night but today she feels a bit more dizzy than usual.    Pertinent History Patient report unclear regarding onset. Visited MD on Sept 16 and received  injection with relief for approximately 1 month. Reports she cannot receive an additional injection pending eye surgery. Received dx from MD of hip bursitis in B hips. Reports pain with walking, stanindg, sitting 5/10 current 7/10 worst 3/10 best, with temporary relief when she is sitting "just right" and with naproxen. Agg: activity. Cormorbidities: hx of breast cancer, HTN, MCI, insomnia, cataracts    Patient Stated Goals Be able to move without hurting, walk 1 hour    Currently in Pain? No/denies           Treatment Today:     Therapeutic exercise: to centralize symptoms and improve ROM, strength, muscular endurance, and activity tolerance required for successful completion of functional activities.   4 -way seated reaches forward, down, up, and backward -x 10  Ball tosses in sitting with eye following with head turns -2 x 10 each side (2nd set passing ball back and forth between two people on either side of patient).  Sit to stand holding ball, overhead reach and follow with eyes- 2x10 Seated trunk/bilateral shoulder D2 holding ball- focusing on trunk rot and tracking ball with eyes- 2x10 R and L  Gait belt donned and PT CGA for standing exercises: Standing feet together, look up/down, look right/left with PT verbal cue to change direction every 2 sec.  Forward step arms overhead with clapping -2 x 10 B Backward steps with arms over with clapping - x 10 B  Forward/backward rocking steps with reciprocal overhead motion - x 10 with left foot front Required demonstration by second person directed by PT in front of patient to help her coordinate motion.  Ambulated ~100 feet to vehicle with CGA for safety due to report of increased dizziness today.   Performed exercises to improve hip strength and coordination overall    PT Education - 04/18/20 0903    Education provided Yes    Education Details form/technique with exercise    Person(s) Educated Patient    Methods Explanation;Demonstration;Tactile cues;Verbal cues    Comprehension Verbalized understanding;Returned demonstration;Verbal cues required;Tactile cues required;Need further instruction            PT Short Term Goals - 09/12/19 1419      PT SHORT TERM GOAL #1   Title Patient will demonstrate complaince with HEP to speed recovery and reduce total number of visits.    Baseline HEP given    Time 2    Period Weeks    Status New    Target Date 09/27/19             PT Long Term Goals - 03/20/20 1355      PT LONG TERM GOAL #1   Title  Patient will improve hip strength to 4/5 grossly to facilitate improved mobility with ADLs.    Baseline 3, 4-; 12/22 deferred to NV; 11/09/2019: 4-/5; 12/07/2019 L hip flexor 4, abd and ext 3+; 01/29/2020: 4/5 for most hip motions, 3+/5 for abd, ext; 03/20/2020: 4/5 for hip movements    Time 8    Period Weeks    Status On-going      PT LONG TERM GOAL #2   Title Patient will demonstrate walking tolerance increased to 30 minutes without increase in symptoms to demonstrate improved ability to perform walking program for cardiovascular health.    Baseline 10 minute tolerance (patient report); 12/22 10 minutes (per pt report); 11/09/2019: 7 min; 12/07/2019 unable to assess due to icy streets;  01/29/2020: 10 min; 03/20/2020    Time 8  Period Weeks    Status On-going      PT LONG TERM GOAL #3   Title Patient will report worst pain of 3/10 during therapy to demonstrate reduced disability    Baseline Worst 7/10; 12/22 worst 9/10; 11/09/2019: 9/10; 12/07/2019 7/10; 01/29/2020: 7/10    Time 8    Period Weeks    Status On-going      PT LONG TERM GOAL #4   Title Patient will demonstrate ability to perform all bed mobility maneuvers without increase in pain for reduced disability with mobility and transfers with ADLs.    Baseline Reports pain with bed mobility supine <> sidelying L/R, sidelying to sitting EOB; 12/22 pt able to perform I without increased pain    Time 8    Period Weeks    Status Achieved      PT LONG TERM GOAL #5   Title Patient will improve 10MW speed to 1.0 m/sec to meet norms for minimal fall risk and full community ambulation.    Baseline 0.88 m/sec; 12/22 0.96 m/sec; 11/09/2019: deferred; 12/07/2019 0.90 m/sec; 01/29/2020: .9  m/sec; 03/20/2020: .47m/s    Time 8    Period Weeks    Status On-going      PT LONG TERM GOAL #6   Title Patient will increase 6MWT by 200 ft to demonstrate improvement in functional capacity for transition to cardiac walking program    Baseline 6MWT = 1195; 1/7/20201:  deferred; 12/07/2019 1095; 01/29/2020: deferred secondary to fatigue; 03/20/2020: 1074ft    Time 8    Period Weeks    Status Deferred      PT LONG TERM GOAL #7   Title Patient will improve her 5xsts to under 15 sec to indicate singificnat improvement in LE functional strength and decrease in fall risk.    Baseline 24.5 sec; 03/20/2020: 19.19 sec    Time 6    Period Weeks    Status On-going                 Plan - 04/18/20 1137    Clinical Impression Statement Patient tolerated treatment well overall despite report of increased dizziness today. She did require breaks to regain her sense of equilibrium between sets and exercises and found it very challenging to look overhead. Became somewhat anxious with the last exercise due to the increased demand of complexity and coordination but was able to perform it with adequate breaks and support. Patient continues to be at risk of falling and has difficulty with balance. Patient would benefit from continued management of limiting condition by skilled physical therapist to address remaining impairments and functional limitations to work towards stated goals and return to PLOF or maximal functional independence.    Personal Factors and Comorbidities Age;Comorbidity 3+;Time since onset of injury/illness/exacerbation;Comorbidity 2    Comorbidities MCI, HTN, CA, cataracts    Examination-Activity Limitations Bend;Sit;Squat;Stairs;Stand;Sleep    Examination-Participation Restrictions Cleaning;Community Activity    Stability/Clinical Decision Making Evolving/Moderate complexity    Rehab Potential Fair    Clinical Impairments Affecting Rehab Potential MCI    PT Frequency 2x / week    PT Duration 6 weeks    PT Treatment/Interventions ADLs/Self Care Home Management;Therapeutic exercise;Patient/family education;Neuromuscular re-education;Therapeutic activities;Functional mobility training;Balance training;Manual techniques;Gait training;Stair training;Moist  Heat;Electrical Stimulation;Cryotherapy;Taping;Dry needling;Energy conservation;Passive range of motion;Joint Manipulations    PT Next Visit Plan Progress glute therex, pelvic-hip dissociation; check and progress HEP with stretches    PT Home Exercise Plan lateral step ups, step ups, hip kickers ext/abd red theraband  Consulted and Agree with Plan of Care Patient           Patient will benefit from skilled therapeutic intervention in order to improve the following deficits and impairments:  Decreased range of motion, Postural dysfunction, Pain, Abnormal gait, Decreased activity tolerance, Decreased coordination, Decreased endurance, Decreased balance, Decreased mobility, Difficulty walking, Hypomobility, Increased muscle spasms, Impaired perceived functional ability, Decreased strength, Impaired flexibility  Visit Diagnosis: Muscle weakness (generalized)  Difficulty in walking, not elsewhere classified     Problem List Patient Active Problem List   Diagnosis Date Noted  . Parkinson disease ([place]) 02/22/2020  . 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 Bipolar disorder, in full remission, most recent episode depressed ([place]) 11/08/2019  . Avitaminosis D 08/24/2019  . Insomnia due to mental condition 05/31/2019  . Bipolar I disorder, most recent episode depressed ([place]) 05/31/2019  . Mild cognitive disorder 05/31/2019  . Alcohol use disorder, moderate, in sustained remission ([place]) 05/31/2019  . Elevated tumor markers 05/27/2019  . GAD (generalized anxiety disorder) 04/10/2019  . Altered mental status 04/10/2019  . MDD (major depressive disorder), severe ([place]) 01/31/2019  . High serum carbohydrate antigen 19-9 (CA19-9) 12/01/2018  . Adjustment disorder with anxious mood 11/24/2018  . Essential hypertension 11/24/2018  . B12 deficiency 10/17/2018  . Incisional hernia, without obstruction or gangrene 09/12/2018  . Genetic testing 04/07/2018  . Family history of lung cancer 03/24/2018  .  Malignant neoplasm of upper-outer quadrant of right breast in female, estrogen receptor positive ([place]) 03/17/2018  . Malignant neoplasm of upper-inner quadrant of right breast in female, estrogen receptor positive ([place]) 03/17/2018  . History of alcohol dependence ([place]) 02/07/2018  . History of psychosis 02/07/2018  . History of insomnia 02/07/2018    Everlean Alstrom. Graylon Good, PT, DPT 04/18/20, 11:38 AM  [facility] [address] [place], [address] Phone: [phone]   Fax:  [phone]  Name: Kendra [name] MRN: [id number] Date of Birth: 1946/05/18

## 2020-04-18 ENCOUNTER — Ambulatory Visit: Payer: Medicare PPO | Admitting: Occupational Therapy

## 2020-04-18 ENCOUNTER — Ambulatory Visit (INDEPENDENT_AMBULATORY_CARE_PROVIDER_SITE_OTHER): Payer: Medicare PPO | Admitting: Licensed Clinical Social Worker

## 2020-04-18 ENCOUNTER — Ambulatory Visit: Payer: Medicare PPO

## 2020-04-18 DIAGNOSIS — M6281 Muscle weakness (generalized): Secondary | ICD-10-CM | POA: Diagnosis not present

## 2020-04-18 DIAGNOSIS — R262 Difficulty in walking, not elsewhere classified: Secondary | ICD-10-CM | POA: Diagnosis not present

## 2020-04-18 DIAGNOSIS — F331 Major depressive disorder, recurrent, moderate: Secondary | ICD-10-CM

## 2020-04-18 DIAGNOSIS — F4322 Adjustment disorder with anxiety: Secondary | ICD-10-CM

## 2020-04-18 DIAGNOSIS — M25622 Stiffness of left elbow, not elsewhere classified: Secondary | ICD-10-CM

## 2020-04-18 DIAGNOSIS — M25522 Pain in left elbow: Secondary | ICD-10-CM | POA: Diagnosis not present

## 2020-04-18 DIAGNOSIS — R531 Weakness: Secondary | ICD-10-CM | POA: Diagnosis not present

## 2020-04-18 NOTE — Therapy (Signed)
[facility] [address] [place], [address] Phone: [phone]   Fax:  [phone]  Occupational Therapy Treatment  Patient Details  Name: Kendra [name] MRN: [id number] Date of Birth: [birth year] Referring Provider (OT): Vance Peper Date: 04/18/2020   OT End of Session - 04/18/20 1612    Visit Number 10    Number of Visits 14    Date for OT Re-Evaluation 05/31/20    OT Start Time 8185    OT Stop Time 1505    OT Time Calculation (min) 50 min    Activity Tolerance Patient tolerated treatment well    Behavior During Therapy [facility] for tasks assessed/performed           Past Medical History:  Diagnosis Date  . Anxiety   . Breast cancer ([place]) 03/2018   right breast cancer at 11:00 and 1:00  . Bursitis    bilateral hips and knees  . Complication of anesthesia   . Depression   . Family history of breast cancer 03/24/2018  . Family history of lung cancer 03/24/2018  . History of alcohol dependence ([facility]) 2007   no ETOH; resolved since 2007  . History of hiatal hernia   . History of psychosis 2014   due to sleep disturbance  . Hypertension   . Personal history of radiation therapy 06/2018-07/2018   right breast ca  . PONV (postoperative nausea and vomiting)     Past Surgical History:  Procedure Laterality Date  . BREAST BIOPSY Right 03/14/2018   11:00 DCIS and invasive ductal carcinoma  . BREAST BIOPSY Right 03/14/2018   1:00 Invasive ductal carcinoma  . BREAST LUMPECTOMY Right 04/27/2018   lumpectomy of 11 and 1:00 cancers, clear margins, negative LN  . BREAST LUMPECTOMY WITH RADIOACTIVE SEED AND SENTINEL LYMPH NODE BIOPSY Right 04/27/2018   Procedure: RIGHT BREAST LUMPECTOMY WITH RADIOACTIVE SEED X 2 AND RIGHT SENTINEL LYMPH NODE BIOPSY;  Surgeon: [name], MD;  Location: [facility];  Service: General;  Laterality: Right;  . CATARACT EXTRACTION W/PHACO Left  08/30/2019   Procedure: CATARACT EXTRACTION PHACO AND INTRAOCULAR LENS PLACEMENT ([facility]) LEFT panoptix toric  01:20.5  12.7%  10.26;  Surgeon: Leandrew Koyanagi, MD;  Location: [facility];  Service: Ophthalmology;  Laterality: Left;  . CATARACT EXTRACTION W/PHACO Right 09/20/2019   Procedure: CATARACT EXTRACTION PHACO AND INTRAOCULAR LENS PLACEMENT ([facility]) RIGHT 5.71  00:36.4  15.7%;  Surgeon: Leandrew Koyanagi, MD;  Location: [facility];  Service: Ophthalmology;  Laterality: Right;  . LAPAROTOMY N/A 02/10/2018   Procedure: EXPLORATORY LAPAROTOMY;  Surgeon: Isabel Caprice, MD;  [facility];  Service: Gynecology;  Laterality: N/A;  . MASS EXCISION  01/2018   abdominal  . OVARIAN CYST REMOVAL    . SALPINGOOPHORECTOMY Bilateral 02/10/2018   Procedure: BILATERAL SALPINGO OOPHORECTOMY; PERITONEAL WASHINGS;  Surgeon: Isabel Caprice, MD;  [facility];  Service: Gynecology;  Laterality: Bilateral;  . TONSILLECTOMY    . TONSILLECTOMY AND ADENOIDECTOMY  1953    There were no vitals filed for this visit.   Subjective Assessment - 04/18/20 1606    Subjective  They increased my parkinson meds to 1.5 - I can do 1/2 gallon but not gallon yet - I think my elbow is straight like the other one now    Pertinent History Pt fell 11/23/2019 and fracture her R elbow - minimal displaced radius head fx - pt in sling if she is  out and about - but refer to OT for ROM ,PROM and weight limited 1-2 lbs -    Patient Stated Goals I would like the ROM , strength better so I can push up from chair and have no pain    Currently in Pain? No/denies             L AROM for elbow compare to R - same for flexion - extention Strength flexion and extension 4/5 -with end range weaker for elbow extention    Issued new putty - dark blue mix with light blue  Upgraded to 3 sets12 toget fingers into putty  Cont withWall pushupdoing well with per pt - 3 sets of 10-12pain free Pt to keep  hands on shoulder height   able to lift and carry 8# without any compensation at the UE.- except if have to  reach with shoulder - then shoulder little weak  Some pullling in elbow with 9 lbs   GTB for elbow extention and flexion - bilateral 12 reps  GTB D1 and D2 patterns add for shoulder - bilateral 10 reps  No pain or issues can increase in 5-7 days to 2 sets and another 5 days 3 sets  1 x day  Cont with GTB for scapula squeezes                     OT Education - 04/18/20 1612    Education Details changes to HEP    Person(s) Educated Patient    Methods Explanation;Demonstration;Tactile cues;Verbal cues;Handout    Comprehension Verbal cues required;Returned demonstration;Verbalized understanding            OT Short Term Goals - 02/21/20 1619      OT SHORT TERM GOAL #1   Title Pt to be independent in HEP to increase L elbow extention to -5 and increase strenght to carry more than 5 lbs    Baseline elbow extention AROM -5 and PROM and in session 0 - carry and lift 5 lbs with ease    Status Achieved             OT Long Term Goals - 03/27/20 1444      OT LONG TERM GOAL #1   Title L Elbow extention improve for pt to pull shirt over head, push up and carry more than 8 lbs without increase symptoms    Baseline 0 to -5 for extention , upgrade to 3 lbs for elbow HEP , no pain reported but can carry and lift 5 lbs - but more compensate with body  has some discomfort.  03/27/2020 able to carry up to 6# and able to pull shirt overhead.    Time 4    Period Weeks    Status On-going      OT LONG TERM GOAL #2   Title Pt AROM and strength in L elbow improve to prior level for function    Baseline can do wall pushups , dress , bath, - no pushing up out of bed or chair , groceries, laundery and fitting sheet still favor.  03-27-20 she is still doing wall push ups, helps with meal prep but husband cooks, shopping, some difficulty with fitted sheet.    Time 4    Period  Weeks    Status On-going                 Plan - 04/18/20 1613    Clinical Impression Statement Pt this date with full  extention of elbow -same as R one - increase strength in elbow to pick up without compensation 8 lbs - but 9 was causing some pulling at elbow - decrease shoulder strenght with reaching to lift 7 lbs - but elbow good-change to more band HEP - and included shoulder    OT Occupational Profile and History Problem Focused Assessment - Including review of records relating to presenting problem    Occupational performance deficits (Please refer to evaluation for details): ADL's;IADL's;Play;Leisure;Social Participation    Body Structure / Function / Physical Skills ADL;Flexibility;ROM;UE functional use;Pain;Strength;IADL    Rehab Potential Good    Clinical Decision Making Limited treatment options, no task modification necessary    Comorbidities Affecting Occupational Performance: May have comorbidities impacting occupational performance    Modification or Assistance to Complete Evaluation  No modification of tasks or assist necessary to complete eval    OT Frequency Biweekly    OT Duration 4 weeks    OT Treatment/Interventions Self-care/ADL training;Therapeutic exercise;Patient/family education;Cryotherapy;Paraffin;Moist Heat;Manual Therapy;Passive range of motion;Therapeutic activities    Plan assess progress with HEP    OT Home Exercise Plan see pt instruction    Consulted and Agree with Plan of Care Patient           Patient will benefit from skilled therapeutic intervention in order to improve the following deficits and impairments:   Body Structure / Function / Physical Skills: ADL, Flexibility, ROM, UE functional use, Pain, Strength, IADL       Visit Diagnosis: Muscle weakness (generalized)  Stiffness of left elbow, not elsewhere classified  Pain in left elbow    Problem List Patient Active Problem List   Diagnosis Date Noted  . Parkinson disease  ([place]) 02/22/2020  . 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 Bipolar disorder, in full remission, most recent episode depressed ([place]) 11/08/2019  . Avitaminosis D 08/24/2019  . Insomnia due to mental condition 05/31/2019  . Bipolar I disorder, most recent episode depressed ([place]) 05/31/2019  . Mild cognitive disorder 05/31/2019  . Alcohol use disorder, moderate, in sustained remission ([place]) 05/31/2019  . Elevated tumor markers 05/27/2019  . GAD (generalized anxiety disorder) 04/10/2019  . Altered mental status 04/10/2019  . MDD (major depressive disorder), severe ([place]) 01/31/2019  . High serum carbohydrate antigen 19-9 (CA19-9) 12/01/2018  . Adjustment disorder with anxious mood 11/24/2018  . Essential hypertension 11/24/2018  . B12 deficiency 10/17/2018  . Incisional hernia, without obstruction or gangrene 09/12/2018  . Genetic testing 04/07/2018  . Family history of lung cancer 03/24/2018  . Malignant neoplasm of upper-outer quadrant of right breast in female, estrogen receptor positive ([place]) 03/17/2018  . Malignant neoplasm of upper-inner quadrant of right breast in female, estrogen receptor positive ([place]) 03/17/2018  . History of alcohol dependence ([place]) 02/07/2018  . History of psychosis 02/07/2018  . History of insomnia 02/07/2018    Rosalyn Gess OTR/l,CLT 04/18/2020, 4:17 PM  [facility] [address], [place], [address] Phone: [phone]   Fax:  [phone]  Name: Kendra [name] MRN: [id number] Date of Birth: 05/13/1946

## 2020-04-18 NOTE — Progress Notes (Signed)
Virtual Visit via Telephone Note  I connected with Kendra [name] on 04/18/20 at 11:00 AM EDT by telephone and verified that I am speaking with the correct person using two identifiers. Attempted video visit unsuccessfully.  Location: Patient: home Provider: [facility]   I discussed the limitations, risks, security and privacy concerns of performing an evaluation and management service by telephone and the availability of in person appointments. I also discussed with the patient that there may be a patient responsible charge related to this service. The patient expressed understanding and agreed to proceed.  I discussed the assessment and treatment plan with the patient. The patient was provided an opportunity to ask questions and all were answered. The patient agreed with the plan and demonstrated an understanding of the instructions.   The patient was advised to call back or seek an in-person evaluation if the symptoms worsen or if the condition fails to improve as anticipated.  I provided 60 minutes of non-face-to-face time during this encounter.   Edilson Vital R Lashan Gluth, LCSW    THERAPIST PROGRESS NOTE  Session Time: 49  Participation Level: Active  Behavioral Response: NeatAlertAnxious  Type of Therapy: Individual Therapy  Treatment Goals addressed: Anxiety and Coping  Interventions: Supportive  Summary: Kurstin Dimarzo is a 74 y.o. female who presents with continuing anxiety and slight increase in depression symptoms since last session. Pt reports that she is compliant with medication. Explored fears pt has about medical diagnosis (parkinson's). Encouraged pt to continue reaching out for supports--pt has good relationship with PT and OT. Pt reports that she has been spending quality time with husband: lunches, painting classes, and playing dominoes together. Encouraged pt to continue spending quality time with loved ones and focusing on self care.   Suicidal/Homicidal:  No  Therapist Response: Cyani reports fluctuating progress in management of stress/anxiety/depression due to triggering external stressors. Will continue developing stress management and mood management skills.  Plan: Return again in 3 weeks.  Diagnosis: Axis I: Adjustment Disorder with Anxiety    Axis II: No diagnosis    [name] [place], LCSW 04/18/2020

## 2020-04-18 NOTE — Patient Instructions (Signed)
See note

## 2020-04-19 DIAGNOSIS — Z961 Presence of intraocular lens: Secondary | ICD-10-CM | POA: Diagnosis not present

## 2020-04-25 ENCOUNTER — Other Ambulatory Visit: Payer: Self-pay

## 2020-04-25 ENCOUNTER — Ambulatory Visit: Payer: Medicare PPO

## 2020-04-25 DIAGNOSIS — M25522 Pain in left elbow: Secondary | ICD-10-CM | POA: Diagnosis not present

## 2020-04-25 DIAGNOSIS — R262 Difficulty in walking, not elsewhere classified: Secondary | ICD-10-CM | POA: Diagnosis not present

## 2020-04-25 DIAGNOSIS — R531 Weakness: Secondary | ICD-10-CM | POA: Diagnosis not present

## 2020-04-25 DIAGNOSIS — M25622 Stiffness of left elbow, not elsewhere classified: Secondary | ICD-10-CM | POA: Diagnosis not present

## 2020-04-25 DIAGNOSIS — M6281 Muscle weakness (generalized): Secondary | ICD-10-CM | POA: Diagnosis not present

## 2020-04-25 NOTE — Therapy (Signed)
[facility] [address] [place], [address] Phone: [phone]   Fax:  [phone]  Physical Therapy Treatment  Patient Details  Name: Kendra [name] MRN: [id number] Date of Birth: 11/29/1945 No data recorded  Encounter Date: 04/25/2020   PT End of Session - 04/25/20 1138    Visit Number 42    Number of Visits 50    Authorization Type 3/10    Authorization Time Period until 06/12/2020    PT Start Time 1030    PT Stop Time 1115    PT Time Calculation (min) 45 min    Equipment Utilized During Treatment Gait belt    Activity Tolerance Patient tolerated treatment well;No increased pain    Behavior During Therapy WFL for tasks assessed/performed           Past Medical History:  Diagnosis Date  . Anxiety   . Breast cancer ([facility]) 03/2018   right breast cancer at 11:00 and 1:00  . Bursitis    bilateral hips and knees  . Complication of anesthesia   . Depression   . Family history of breast cancer 03/24/2018  . Family history of lung cancer 03/24/2018  . History of alcohol dependence ([facility]) 2007   no ETOH; resolved since 2007  . History of hiatal hernia   . History of psychosis 2014   due to sleep disturbance  . Hypertension   . Personal history of radiation therapy 06/2018-07/2018   right breast ca  . PONV (postoperative nausea and vomiting)     Past Surgical History:  Procedure Laterality Date  . BREAST BIOPSY Right 03/14/2018   11:00 DCIS and invasive ductal carcinoma  . BREAST BIOPSY Right 03/14/2018   1:00 Invasive ductal carcinoma  . BREAST LUMPECTOMY Right 04/27/2018   lumpectomy of 11 and 1:00 cancers, clear margins, negative LN  . BREAST LUMPECTOMY WITH RADIOACTIVE SEED AND SENTINEL LYMPH NODE BIOPSY Right 04/27/2018   Procedure: RIGHT BREAST LUMPECTOMY WITH RADIOACTIVE SEED X 2 AND RIGHT SENTINEL LYMPH NODE BIOPSY;  Surgeon: [name], MD;  Location: [facility];   Service: General;  Laterality: Right;  . CATARACT EXTRACTION W/PHACO Left 08/30/2019   Procedure: CATARACT EXTRACTION PHACO AND INTRAOCULAR LENS PLACEMENT ([facility]) LEFT panoptix toric  01:20.5  12.7%  10.26;  Surgeon: Leandrew Koyanagi, MD;  Location: [facility];  Service: Ophthalmology;  Laterality: Left;  . CATARACT EXTRACTION W/PHACO Right 09/20/2019   Procedure: CATARACT EXTRACTION PHACO AND INTRAOCULAR LENS PLACEMENT ([facility]) RIGHT 5.71  00:36.4  15.7%;  Surgeon: Leandrew Koyanagi, MD;  Location: [facility];  Service: Ophthalmology;  Laterality: Right;  . LAPAROTOMY N/A 02/10/2018   Procedure: EXPLORATORY LAPAROTOMY;  Surgeon: Isabel Caprice, MD;  Location: [facility];  Service: Gynecology;  Laterality: N/A;  . MASS EXCISION  01/2018   abdominal  . OVARIAN CYST REMOVAL    . SALPINGOOPHORECTOMY Bilateral 02/10/2018   Procedure: BILATERAL SALPINGO OOPHORECTOMY; PERITONEAL WASHINGS;  Surgeon: Isabel Caprice, MD;  Location: [facility];  Service: Gynecology;  Laterality: Bilateral;  . TONSILLECTOMY    . TONSILLECTOMY AND ADENOIDECTOMY  1953    There were no vitals filed for this visit.   Subjective Assessment - 04/25/20 1128    Subjective Patient reports she has had no pain for the past couple of weeks. Patient states her medication has been helping with her Parkinson's symptoms, however she had not had an oppertunity to take her medication.    Pertinent History Patient report  unclear regarding onset. Visited MD on Sept 16 and received injection with relief for approximately 1 month. Reports she cannot receive an additional injection pending eye surgery. Received dx from [facility] of hip bursitis in B hips. Reports pain with walking, stanindg, sitting 5/10 current 7/10 worst 3/10 best, with temporary relief when she is sitting "just right" and with naproxen. Agg: activity. Cormorbidities: hx of breast cancer, HTN, MCI, insomnia, cataracts    Patient Stated Goals Be able to move without  hurting, walk 1 hour    Currently in Pain? No/denies               TREATMENT Therapeutic Exercise 4 -way seated reaches forward, down, up, and backward -x 10  Ambulation with head turns up/down - x 11ft  Ambulation with head turns left/right - x 145ft Ball tosses in standing with walking with eye following with head turns - 156ft Forward step arms overhead with clapping -x 15 B Backward steps with arms over with clapping - x 15 B  Forward/backward steps with reciprocal overhead motion - x 15 B Performed exercises to improve hip strength and coordination overall      PT Education - 04/25/20 1136    Education provided Yes    Education Details form/technique with exercise    Person(s) Educated Patient    Methods Explanation;Demonstration    Comprehension Verbalized understanding;Returned demonstration            PT Short Term Goals - 09/12/19 1419      PT SHORT TERM GOAL #1   Title Patient will demonstrate complaince with HEP to speed recovery and reduce total number of visits.    Baseline HEP given    Time 2    Period Weeks    Status New    Target Date 09/27/19             PT Long Term Goals - 03/20/20 1355      PT LONG TERM GOAL #1   Title Patient will improve hip strength to 4/5 grossly to facilitate improved mobility with ADLs.    Baseline 3, 4-; 12/22 deferred to NV; 11/09/2019: 4-/5; 12/07/2019 L hip flexor 4, abd and ext 3+; 01/29/2020: 4/5 for most hip motions, 3+/5 for abd, ext; 03/20/2020: 4/5 for hip movements    Time 8    Period Weeks    Status On-going      PT LONG TERM GOAL #2   Title Patient will demonstrate walking tolerance increased to 30 minutes without increase in symptoms to demonstrate improved ability to perform walking program for cardiovascular health.    Baseline 10 minute tolerance (patient report); 12/22 10 minutes (per pt report); 11/09/2019: 7 min; 12/07/2019 unable to assess due to icy streets;  01/29/2020: 10 min; 03/20/2020    Time 8     Period Weeks    Status On-going      PT LONG TERM GOAL #3   Title Patient will report worst pain of 3/10 during therapy to demonstrate reduced disability    Baseline Worst 7/10; 12/22 worst 9/10; 11/09/2019: 9/10; 12/07/2019 7/10; 01/29/2020: 7/10    Time 8    Period Weeks    Status On-going      PT LONG TERM GOAL #4   Title Patient will demonstrate ability to perform all bed mobility maneuvers without increase in pain for reduced disability with mobility and transfers with ADLs.    Baseline Reports pain with bed mobility supine <> sidelying L/R, sidelying to sitting EOB;  12/22 pt able to perform I without increased pain    Time 8    Period Weeks    Status Achieved      PT LONG TERM GOAL #5   Title Patient will improve 10MW speed to 1.0 m/sec to meet norms for minimal fall risk and full community ambulation.    Baseline 0.88 m/sec; 12/22 0.96 m/sec; 11/09/2019: deferred; 12/07/2019 0.90 m/sec; 01/29/2020: .9  m/sec; 03/20/2020: .76m/s    Time 8    Period Weeks    Status On-going      PT LONG TERM GOAL #6   Title Patient will increase 6MWT by 200 ft to demonstrate improvement in functional capacity for transition to cardiac walking program    Baseline 6MWT = 1195; 1/7/20201: deferred; 12/07/2019 1095; 01/29/2020: deferred secondary to fatigue; 03/20/2020: 1058ft    Time 8    Period Weeks    Status Deferred      PT LONG TERM GOAL #7   Title Patient will improve her 5xsts to under 15 sec to indicate singificnat improvement in LE functional strength and decrease in fall risk.    Baseline 24.5 sec; 03/20/2020: 19.19 sec    Time 6    Period Weeks    Status On-going                 Plan - 04/25/20 1142    Clinical Impression Statement Improvement with performance of large standing motions today to counteract the effects of Parkinson's. Patient requires less guarding to maintain balance during the session today indicating improvement. COnitnues to have increased dizziness with performance  of balanceexercises. Patient will benefit from further skilled therapy to return to prior level of function.    Personal Factors and Comorbidities Age;Comorbidity 3+;Time since onset of injury/illness/exacerbation;Comorbidity 2    Comorbidities MCI, HTN, CA, cataracts    Examination-Activity Limitations Bend;Sit;Squat;Stairs;Stand;Sleep    Examination-Participation Restrictions Cleaning;Community Activity    Stability/Clinical Decision Making Evolving/Moderate complexity    Rehab Potential Fair    Clinical Impairments Affecting Rehab Potential MCI    PT Frequency 2x / week    PT Duration 6 weeks    PT Treatment/Interventions ADLs/Self Care Home Management;Therapeutic exercise;Patient/family education;Neuromuscular re-education;Therapeutic activities;Functional mobility training;Balance training;Manual techniques;Gait training;Stair training;Moist Heat;Electrical Stimulation;Cryotherapy;Taping;Dry needling;Energy conservation;Passive range of motion;Joint Manipulations    PT Next Visit Plan Progress glute therex, pelvic-hip dissociation; check and progress HEP with stretches    PT Home Exercise Plan lateral step ups, step ups, hip kickers ext/abd red theraband    Consulted and Agree with Plan of Care Patient           Patient will benefit from skilled therapeutic intervention in order to improve the following deficits and impairments:  Decreased range of motion, Postural dysfunction, Pain, Abnormal gait, Decreased activity tolerance, Decreased coordination, Decreased endurance, Decreased balance, Decreased mobility, Difficulty walking, Hypomobility, Increased muscle spasms, Impaired perceived functional ability, Decreased strength, Impaired flexibility  Visit Diagnosis: Muscle weakness (generalized)  Difficulty in walking, not elsewhere classified     Problem List Patient Active Problem List   Diagnosis Date Noted  . Parkinson disease ([facility]) 02/22/2020  . Mucinous cystadenoma  12/24/2019  . Bipolar disorder, in full remission, most recent episode depressed ([facility]) 11/08/2019  . Avitaminosis D 08/24/2019  . Insomnia due to mental condition 05/31/2019  . Bipolar I disorder, most recent episode depressed ([facility]) 05/31/2019  . Mild cognitive disorder 05/31/2019  . Alcohol use disorder, moderate, in sustained remission ([facility]) 05/31/2019  . Elevated tumor markers 05/27/2019  .  GAD (generalized anxiety disorder) 04/10/2019  . Altered mental status 04/10/2019  . MDD (major depressive disorder), severe ([facility]) 01/31/2019  . High serum carbohydrate antigen 19-9 (CA19-9) 12/01/2018  . Adjustment disorder with anxious mood 11/24/2018  . Essential hypertension 11/24/2018  . B12 deficiency 10/17/2018  . Incisional hernia, without obstruction or gangrene 09/12/2018  . Genetic testing 04/07/2018  . Family history of lung cancer 03/24/2018  . Malignant neoplasm of upper-outer quadrant of right breast in female, estrogen receptor positive ([facility]) 03/17/2018  . Malignant neoplasm of upper-inner quadrant of right breast in female, estrogen receptor positive ([facility]) 03/17/2018  . History of alcohol dependence ([facility]) 02/07/2018  . History of psychosis 02/07/2018  . History of insomnia 02/07/2018    Blythe Stanford, PT DPT 04/25/2020, 4:22 PM  [facility] [address], [place], [address] Phone: [phone]   Fax:  [phone]  Name: Kendra [name] MRN: [id number] Date of Birth: Jun 22, 1946

## 2020-04-25 NOTE — Progress Notes (Signed)
Kendra [name]  [address] [place], [place] [address] Phone: [phone]  Fax: [phone]   Clinic Day:  04/29/2020  Referring physician: Virginia Crews, MD  Chief Complaint: Kendra [name] is a 74 y.o. female with multifocal stage IA right breast cancerand B12 deficiency who is seen for 4 month assessment.   HPI: The patient was last seen in the medical oncology clinic via telemedicine on 12/25/2019. At that time, she felt good and denied any breast concerns. Hematocrit was 37.0, hemoglobin 12.3, platelets 214,000, WBC 5,100. Sodium was 129.  CA 19-9 was 50.  She continued Femara.  Lumbar spine MRI without contrast on 01/31/2020 for low back pain revealed advanced multilevel degenerative changes of the lumbar spine with severe L2-3 and L3-4 canal stenosis. Multilevel bilateral foraminal stenosis, moderate-to-severe on the right at L4-5 and moderate on the left at L2-3.  The patient has been followed by Gayland Curry, PA-C, for Parkinsonism, last seen 04/15/2020. She is undergoing physical and occupational therapy. She is on Sinemet. Will follow up in 4 months.  Vitamin B12 was 672 on 02/22/2020.  The patient has a mammogram and bone density scan scheduled for 06/12/2020.  During the interim, she has been okay. She reports back pain, weight gain, and dizziness but has not fallen in the past 3 months. She has an occasional cough, but thinks it is due to allergies.  She denies fevers, change in vision, headaches and sweats.  She denies any breast concerns. She is taking Femara and Vitamin B12 as prescribed. She had a UTI recently.  The patient was recently diagnosed with Parkinson's and has been on Sinemet TID for about 2 months, which is helping. Her husband reports that she has been more interactive, aware of the world, and has had more initiative. The patient has been seeing physical and occupational therapy.  The patient received her COVID-19  vaccines earlier this year.   Past Medical History:  Diagnosis Date  . Anxiety   . Breast cancer ([facility]) 03/2018   right breast cancer at 11:00 and 1:00  . Bursitis    bilateral hips and knees  . Complication of anesthesia   . Depression   . Family history of breast cancer 03/24/2018  . Family history of lung cancer 03/24/2018  . History of alcohol dependence ([facility]) 2007   no ETOH; resolved since 2007  . History of hiatal hernia   . History of psychosis 2014   due to sleep disturbance  . Hypertension   . Personal history of radiation therapy 06/2018-07/2018   right breast ca  . PONV (postoperative nausea and vomiting)     Past Surgical History:  Procedure Laterality Date  . BREAST BIOPSY Right 03/14/2018   11:00 DCIS and invasive ductal carcinoma  . BREAST BIOPSY Right 03/14/2018   1:00 Invasive ductal carcinoma  . BREAST LUMPECTOMY Right 04/27/2018   lumpectomy of 11 and 1:00 cancers, clear margins, negative LN  . BREAST LUMPECTOMY WITH RADIOACTIVE SEED AND SENTINEL LYMPH NODE BIOPSY Right 04/27/2018   Procedure: RIGHT BREAST LUMPECTOMY WITH RADIOACTIVE SEED X 2 AND RIGHT SENTINEL LYMPH NODE BIOPSY;  Surgeon: [name], MD;  Location: [facility];  Service: General;  Laterality: Right;  . CATARACT EXTRACTION W/PHACO Left 08/30/2019   Procedure: CATARACT EXTRACTION PHACO AND INTRAOCULAR LENS PLACEMENT ([place]) LEFT panoptix toric  01:20.5  12.7%  10.26;  Surgeon: Leandrew Koyanagi, MD;  Location: [facility];  Service: Ophthalmology;  Laterality: Left;  . CATARACT  EXTRACTION W/PHACO Right 09/20/2019   Procedure: CATARACT EXTRACTION PHACO AND INTRAOCULAR LENS PLACEMENT ([facility]) RIGHT 5.71  00:36.4  15.7%;  Surgeon: Leandrew Koyanagi, MD;  Location: [facility];  Service: Ophthalmology;  Laterality: Right;  . LAPAROTOMY N/A 02/10/2018   Procedure: EXPLORATORY LAPAROTOMY;  Surgeon: Isabel Caprice, MD;  Location: [facility];  Service: Gynecology;   Laterality: N/A;  . MASS EXCISION  01/2018   abdominal  . OVARIAN CYST REMOVAL    . SALPINGOOPHORECTOMY Bilateral 02/10/2018   Procedure: BILATERAL SALPINGO OOPHORECTOMY; PERITONEAL WASHINGS;  Surgeon: Isabel Caprice, MD;  Location: [facility];  Service: Gynecology;  Laterality: Bilateral;  . TONSILLECTOMY    . TONSILLECTOMY AND ADENOIDECTOMY  1952    Family History  Problem Relation Age of Onset  . Diabetes Mother   . Breast cancer Mother 76  . Hypertension Mother   . Lung cancer Father        asbestos exposure  . Heart disease Brother 40  . Breast cancer Cousin 23       paternal cousin  . Heart disease Paternal Grandmother 75  . Heart disease Paternal Grandfather     Social History:  reports that she has never smoked. She has never used smokeless tobacco. She reports previous alcohol use. She reports that she does not use drugs. She is married to [name]. They live in [place]. The patient is accompanied by Kendra [name] today.  Allergies:  Allergies  Allergen Reactions  . Hydroxyzine Anaphylaxis    Tongue swollen  . Other Other (See Comments)    Food poisoning   . Shrimp [Shellfish Allergy] Other (See Comments)    Food poisoning     Current Medications: Current Outpatient Medications  Medication Sig Dispense Refill  . calcium citrate-vitamin D (CITRACAL+D) 315-200 MG-UNIT tablet Take 4 tablets by mouth daily.    . carbidopa-levodopa (SINEMET IR) 25-100 MG tablet Take 0.5 tablets by mouth in the morning, at noon, and at bedtime.    . cyanocobalamin (,VITAMIN B-12,) 1000 MCG/ML injection Inject 1 mL (1,000 mcg total) into the muscle every 30 (thirty) days. 3 mL 3  . ketoconazole (NIZORAL) 2 % cream Apply 1 application topically daily. 15 g 0  . letrozole (FEMARA) 2.5 MG tablet Take 1 tablet (2.5 mg total) by mouth daily. 90 tablet 3  . lisinopril-hydrochlorothiazide (ZESTORETIC) 20-12.5 MG tablet Take 1 tablet by mouth daily. 90 tablet 3  . polyethylene glycol  (MIRALAX / GLYCOLAX) 17 g packet Take 17 g by mouth daily.    . risperiDONE (RISPERDAL) 2 MG tablet Take 0.5 tablets (1 mg total) by mouth 2 (two) times daily. 90 tablet 1  . traZODone (DESYREL) 50 MG tablet Take 0.5-1 tablets (25-50 mg total) by mouth at bedtime as needed for sleep. 90 tablet 1  . Acetaminophen 500 MG capsule  (Patient not taking: Reported on 04/29/2020)    . cephALEXin (KEFLEX) 500 MG capsule Take 1 capsule (500 mg total) by mouth 3 (three) times daily. (Patient not taking: Reported on 04/29/2020) 15 capsule 0  . FLUoxetine HCl 60 MG TABS Take 60 mg by mouth daily. (Patient not taking: Reported on 04/29/2020) 90 tablet 1   Current Facility-Administered Medications  Medication Dose Route Frequency Provider Last Rate Last Admin  . lidocaine (PF) (XYLOCAINE) 1 % injection 4 mL  4 mL Intradermal Once Virginia Crews, MD      . lidocaine (PF) (XYLOCAINE) 1 % injection 4 mL  4 mL Intradermal Once Virginia Crews,  MD      . methylPREDNISolone acetate (DEPO-MEDROL) injection 40 mg  40 mg Intramuscular Once Virginia Crews, MD      . methylPREDNISolone acetate (DEPO-MEDROL) injection 40 mg  40 mg Intramuscular Once Virginia Crews, MD         Review of Systems  Constitutional: Negative for chills, diaphoresis, fever, malaise/fatigue and weight loss.  HENT: Negative.  Negative for congestion, ear discharge, ear pain, hearing loss, nosebleeds, sinus pain, sore throat and tinnitus.   Eyes: Negative.  Negative for blurred vision, double vision, photophobia and redness.  Respiratory: Positive for cough (every once in a while). Negative for hemoptysis, sputum production, shortness of breath and wheezing.   Cardiovascular: Negative.  Negative for chest pain, palpitations and leg swelling.  Gastrointestinal: Positive for constipation (takes miralax). Negative for abdominal pain, blood in stool, diarrhea, heartburn, nausea and vomiting.  Genitourinary: Negative.  Negative for  dysuria, frequency, hematuria and urgency.  Musculoskeletal: Positive for back pain. Negative for falls, joint pain, myalgias and neck pain.  Skin: Negative.  Negative for itching and rash.  Neurological: Positive for dizziness. Negative for sensory change, speech change, focal weakness, weakness and headaches.       Balance problems  Endo/Heme/Allergies: Does not bruise/bleed easily.  Psychiatric/Behavioral: Positive for memory loss. Negative for depression. The patient is not nervous/anxious and does not have insomnia.   All other systems reviewed and are negative.   Performance status (ECOG): 1  Physical Exam Nursing note reviewed.  Constitutional:      General: She is not in acute distress.    Appearance: She is well-developed. She is not diaphoretic.  HENT:     Head: Normocephalic and atraumatic.     Mouth/Throat:     Mouth: Mucous membranes are moist.     Pharynx: Oropharynx is clear.  Eyes:     General: No scleral icterus.    Extraocular Movements: Extraocular movements intact.     Conjunctiva/sclera: Conjunctivae normal.     Pupils: Pupils are equal, round, and reactive to light.     Comments: Blue eyes.  Cardiovascular:     Rate and Rhythm: Normal rate and regular rhythm.     Pulses: Normal pulses.     Heart sounds: Normal heart sounds. No murmur heard.   Pulmonary:     Effort: Pulmonary effort is normal. No respiratory distress.     Breath sounds: Normal breath sounds. No wheezing or rales.  Chest:     Chest wall: No tenderness.     Breasts:        Right: Skin change (moisture under breast) present.        Left: Skin change (redness and moisture under breast, L>R) present.  Abdominal:     General: Bowel sounds are normal. There is no distension.     Palpations: Abdomen is soft. There is no mass.     Tenderness: There is no abdominal tenderness. There is no guarding or rebound.     Hernia: A hernia is present.  Musculoskeletal:        General: No swelling or  tenderness. Normal range of motion.     Cervical back: Normal range of motion and neck supple.  Lymphadenopathy:     Head:     Right side of head: No preauricular, posterior auricular or occipital adenopathy.     Left side of head: No preauricular, posterior auricular or occipital adenopathy.     Cervical: No cervical adenopathy.     Upper  Body:     Right upper body: No supraclavicular adenopathy.     Left upper body: No supraclavicular adenopathy.     Lower Body: No right inguinal adenopathy. No left inguinal adenopathy.  Skin:    General: Skin is warm and dry.  Neurological:     Mental Status: She is alert and oriented to person, place, and time. Mental status is at baseline.  Psychiatric:        Mood and Affect: Mood normal.        Behavior: Behavior normal.        Judgment: Judgment normal.      Appointment on 04/29/2020  Component Date Value Ref Range Status  . WBC 04/29/2020 5.2  4.0 - 10.5 K/uL Final  . RBC 04/29/2020 4.42  3.87 - 5.11 MIL/uL Final  . Hemoglobin 04/29/2020 12.4  12.0 - 15.0 g/dL Final  . HCT 04/29/2020 38.1  36 - 46 % Final  . MCV 04/29/2020 86.2  80.0 - 100.0 fL Final  . MCH 04/29/2020 28.1  26.0 - 34.0 pg Final  . MCHC 04/29/2020 32.5  30.0 - 36.0 g/dL Final  . RDW 04/29/2020 14.7  11.5 - 15.5 % Final  . Platelets 04/29/2020 188  150 - 400 K/uL Final  . nRBC 04/29/2020 0.0  0.0 - 0.2 % Final  . Neutrophils Relative % 04/29/2020 68  % Final  . Neutro Abs 04/29/2020 3.6  1.7 - 7.7 K/uL Final  . Lymphocytes Relative 04/29/2020 18  % Final  . Lymphs Abs 04/29/2020 0.9  0.7 - 4.0 K/uL Final  . Monocytes Relative 04/29/2020 9  % Final  . Monocytes Absolute 04/29/2020 0.5  0 - 1 K/uL Final  . Eosinophils Relative 04/29/2020 4  % Final  . Eosinophils Absolute 04/29/2020 0.2  0 - 0 K/uL Final  . Basophils Relative 04/29/2020 1  % Final  . Basophils Absolute 04/29/2020 0.1  0 - 0 K/uL Final  . Immature Granulocytes 04/29/2020 0  % Final  . Abs Immature  Granulocytes 04/29/2020 0.02  0.00 - 0.07 K/uL Final   Performed at [name], [address], [place], [place] [address]    Assessment:  Evola Hollis is a 74 y.o. female with multi-focal stage IA right breast cancers/p right lumpectomy on 04/27/2018. Pathology of the right lateral lesionrevealed a 2.5 cm grade II invasive ductal carcinoma with intermediate grade DCIS. The medial lesionrevealed a 1.1 cm grade II invasive ductal carcinoma with intermediate grade DCIS. LVIDS was present. Margins were negative. Zero of 2 lymph nodes were positive. Both tumors were ER + (100%), PR + (70%), and HER-2 negative. Ki-67 was 10%. Pathologic stage was mT2N0.  Oncotype DX revealed a recurrence score of 12 which translated to a distant recurrence of 3% at 9 years with an AI or tamxifen alone. The absolute benefit of chemotherpay was <1%.  Invitae testingon 03/23/2018 revealed a variant of uncertain significance in MSH6.   She received breast radiationfrom 06/30/2018 - 07/20/2018. She began [facility] 09/06/2018. She is tolerating it well.  Bilateral diagnostic mammogram on 06/12/2019 showed no evidence of malignancy in either breast.  CA27.29has been followed: 25.4 on 03/01/2019, 23.5 on 06/01/2019, 19.0 on 08/28/2019, and 26.5 on 12/22/2019.  She underwent exploratory laparotomy, bilateral salpingo-oophrectomy, and pelvic washings on 02/10/2018. 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 Peritoneal washings revealed atypical cells.   She has had elevated tumor markers.CA125has been followed (0-38.1):48.2on 03/27/2019and11.3 on 10/24/2018.CA19-9has been followed (0-35):322 on 01/26/2018,48 on 10/24/2018,  45 on 03/01/2019, 50 on 12/22/2019, and 38 on 04/29/2020.  Abdomen and pelvic CTon 02/04/2018 revealed a large (28.5 x 17 x 25 cm) cystic mass with enhancing septations and nodularity involving the majority of the abdomen. Abdomen  and pelvic CTon 10/14/2018 revealed no acute findings. There was interval surgical resection of large cystic pelvic mass. There was no evidence of residual or metastatic neoplasm. There was suspected hepatic cirrhosis. There were three midline ventral abdominal wall hernias (small epigastric ventral hernia, moderate paraumbilical hernia, and small suprapubic ventral hernia).   She has a history ofelevated LFTs. AST was 50 on 10/06/2018. Hepatitis B and C serologies were negative.  Bone densityon 06/28/2018 was normal with a T score of -0.8 in the right femoral neck.  She has B12 deficiency. B12 was 232 on 08/09/2018 and 386 on 10/24/2018. She began B12 injectionson 08/15/2018.She receives B12 monthly at home.Anti-parietal antibody was elevated (23; 0-20) and intrinsic factor was normal on 10/24/2018.  Folate is 7.2 on 04/29/2020  She was diagnosed with Parkinson's and has been on Sinemet TID for about 2 months.  She has been more interactive, aware of the world, and has had more initiative.   She received her COVID-19 vaccines next year.    Symptomatically, she has been okay. She denies any breast concerns.She had a UTI recently.  Exam is stable.  Plan: 1.   Labs today: CBC with diff, CMP, CA27.29, CA19-9, CA125, folate 2.Multifocal stage IA RIGHT breast cancer Clinically,she is doing well. Exam  reveals no evidence of recurrent disease.  CA27.29 is 22.3 (normal) today. Bilateral diagnostic mammogram on 06/12/2019 showed no evidence of malignancy. Continue Femara. 3. B12 deficiency Patientreceives B12 injections.  She has pernicious anemia. Folate is 7.2 today.  Check folate annually. 4. Multilocular mucinous cystadenoma Benign pathology. Some atypical cells noted in the washings. Abdomen and pelvic CT scanswere negative on  10/14/2018. She has a history of increased markers(CA19-9 and CA125).   CA19-9 is 38 (available after clinic).  CA19-9 has improved since last check. Discuss patient and husband's thoughts about follow-up imaging based on symptoms or rising CA 19-9.   Patient's husband is in agreement.    Continue to monitor. 5.   Candidal skin infection  Patient has a developing candidal skin infection beneath her breasts secondary to trapped moisture.  Rx:  Nystatin topically QID. 6.   Health maintenance  Bone density on 06/12/2020. 7.   Bilateral mammogram 06/12/2020. 8.   RTC in 4 months for MD assessment, labs (CBC with diff, CMP, CA27.29, CA19-9, CA125), review of mammogram and bone density.  I discussed the assessment and treatment plan with the patient.  The patient was provided an opportunity to ask questions and all were answered.  The patient agreed with the plan and demonstrated an understanding of the instructions.  The patient was advised to call back or seek an in person evaluation if the symptoms worsen or if the condition fails to improve as anticipated.  I provided 20 minutes of face-to-face visit time during this encounter and > 50% was spent counseling as documented under my assessment and plan.  I provided these services from the [facility] office.  An additional 7-8 minutes were spent reviewing her chart (Epic and Care Everywhere) including notes, labs, and imaging studies.    Nolon Stalls, MD, PhD  04/29/2020, 10:47 AM  I, Mirian Mo Tufford, am acting as Education administrator for [employer]. Mike Gip, MD, PhD.  I,  C. Mike Gip, MD, have reviewed the above documentation  for accuracy and completeness, and I agree with the above.

## 2020-04-29 ENCOUNTER — Inpatient Hospital Stay: Payer: Medicare PPO

## 2020-04-29 ENCOUNTER — Encounter: Payer: Self-pay | Admitting: Hematology and Oncology

## 2020-04-29 ENCOUNTER — Other Ambulatory Visit: Payer: Self-pay

## 2020-04-29 ENCOUNTER — Inpatient Hospital Stay: Payer: Medicare PPO | Attending: Hematology and Oncology | Admitting: Hematology and Oncology

## 2020-04-29 VITALS — BP 122/74 | HR 89 | Temp 97.0°F | Wt 198.2 lb

## 2020-04-29 DIAGNOSIS — G20 Parkinson's disease: Secondary | ICD-10-CM | POA: Diagnosis not present

## 2020-04-29 DIAGNOSIS — K429 Umbilical hernia without obstruction or gangrene: Secondary | ICD-10-CM | POA: Diagnosis not present

## 2020-04-29 DIAGNOSIS — M47816 Spondylosis without myelopathy or radiculopathy, lumbar region: Secondary | ICD-10-CM | POA: Insufficient documentation

## 2020-04-29 DIAGNOSIS — Z79899 Other long term (current) drug therapy: Secondary | ICD-10-CM | POA: Diagnosis not present

## 2020-04-29 DIAGNOSIS — Z803 Family history of malignant neoplasm of breast: Secondary | ICD-10-CM | POA: Insufficient documentation

## 2020-04-29 DIAGNOSIS — R978 Other abnormal tumor markers: Secondary | ICD-10-CM | POA: Insufficient documentation

## 2020-04-29 DIAGNOSIS — R971 Elevated cancer antigen 125 [CA 125]: Secondary | ICD-10-CM | POA: Diagnosis not present

## 2020-04-29 DIAGNOSIS — C50211 Malignant neoplasm of upper-inner quadrant of right female breast: Secondary | ICD-10-CM

## 2020-04-29 DIAGNOSIS — M48061 Spinal stenosis, lumbar region without neurogenic claudication: Secondary | ICD-10-CM | POA: Insufficient documentation

## 2020-04-29 DIAGNOSIS — D279 Benign neoplasm of unspecified ovary: Secondary | ICD-10-CM

## 2020-04-29 DIAGNOSIS — Z90722 Acquired absence of ovaries, bilateral: Secondary | ICD-10-CM | POA: Insufficient documentation

## 2020-04-29 DIAGNOSIS — Z801 Family history of malignant neoplasm of trachea, bronchus and lung: Secondary | ICD-10-CM | POA: Diagnosis not present

## 2020-04-29 DIAGNOSIS — Z923 Personal history of irradiation: Secondary | ICD-10-CM | POA: Insufficient documentation

## 2020-04-29 DIAGNOSIS — Z8249 Family history of ischemic heart disease and other diseases of the circulatory system: Secondary | ICD-10-CM | POA: Diagnosis not present

## 2020-04-29 DIAGNOSIS — E538 Deficiency of other specified B group vitamins: Secondary | ICD-10-CM

## 2020-04-29 DIAGNOSIS — Z170 Estrogen receptor positive status [ER+]: Secondary | ICD-10-CM | POA: Diagnosis not present

## 2020-04-29 DIAGNOSIS — Z87440 Personal history of urinary (tract) infections: Secondary | ICD-10-CM | POA: Diagnosis not present

## 2020-04-29 DIAGNOSIS — D271 Benign neoplasm of left ovary: Secondary | ICD-10-CM | POA: Diagnosis not present

## 2020-04-29 DIAGNOSIS — C50411 Malignant neoplasm of upper-outer quadrant of right female breast: Secondary | ICD-10-CM | POA: Insufficient documentation

## 2020-04-29 DIAGNOSIS — R05 Cough: Secondary | ICD-10-CM | POA: Insufficient documentation

## 2020-04-29 DIAGNOSIS — Z833 Family history of diabetes mellitus: Secondary | ICD-10-CM | POA: Insufficient documentation

## 2020-04-29 DIAGNOSIS — B372 Candidiasis of skin and nail: Secondary | ICD-10-CM | POA: Insufficient documentation

## 2020-04-29 DIAGNOSIS — Z79811 Long term (current) use of aromatase inhibitors: Secondary | ICD-10-CM | POA: Insufficient documentation

## 2020-04-29 DIAGNOSIS — K439 Ventral hernia without obstruction or gangrene: Secondary | ICD-10-CM | POA: Insufficient documentation

## 2020-04-29 DIAGNOSIS — I10 Essential (primary) hypertension: Secondary | ICD-10-CM | POA: Diagnosis not present

## 2020-04-29 LAB — COMPREHENSIVE METABOLIC PANEL
ALT: 17 U/L (ref 0–44)
AST: 21 U/L (ref 15–41)
Albumin: 3.8 g/dL (ref 3.5–5.0)
Alkaline Phosphatase: 45 U/L (ref 38–126)
Anion gap: 7 (ref 5–15)
BUN: 12 mg/dL (ref 8–23)
CO2: 27 mmol/L (ref 22–32)
Calcium: 9.1 mg/dL (ref 8.9–10.3)
Chloride: 104 mmol/L (ref 98–111)
Creatinine, Ser: 0.97 mg/dL (ref 0.44–1.00)
GFR calc Af Amer: >60 mL/min (ref >60)
GFR calc non Af Amer: 58 mL/min — ABNORMAL LOW (ref >60)
Glucose, Bld: 113 mg/dL — ABNORMAL HIGH (ref 70–99)
Potassium: 3.9 mmol/L (ref 3.5–5.1)
Sodium: 138 mmol/L (ref 135–145)
Total Bilirubin: 0.7 mg/dL (ref 0.3–1.2)
Total Protein: 6.6 g/dL (ref 6.5–8.1)

## 2020-04-29 LAB — CBC WITH DIFFERENTIAL/PLATELET
Abs Immature Granulocytes: 0.02 10*3/uL (ref 0.00–0.07)
Basophils Absolute: 0.1 10*3/uL (ref 0.0–0.1)
Basophils Relative: 1 %
Eosinophils Absolute: 0.2 10*3/uL (ref 0.0–0.5)
Eosinophils Relative: 4 %
HCT: 38.1 % (ref 36.0–46.0)
Hemoglobin: 12.4 g/dL (ref 12.0–15.0)
Immature Granulocytes: 0 %
Lymphocytes Relative: 18 %
Lymphs Abs: 0.9 10*3/uL (ref 0.7–4.0)
MCH: 28.1 pg (ref 26.0–34.0)
MCHC: 32.5 g/dL (ref 30.0–36.0)
MCV: 86.2 fL (ref 80.0–100.0)
Monocytes Absolute: 0.5 10*3/uL (ref 0.1–1.0)
Monocytes Relative: 9 %
Neutro Abs: 3.6 10*3/uL (ref 1.7–7.7)
Neutrophils Relative %: 68 %
Platelets: 188 10*3/uL (ref 150–400)
RBC: 4.42 MIL/uL (ref 3.87–5.11)
RDW: 14.7 % (ref 11.5–15.5)
WBC: 5.2 10*3/uL (ref 4.0–10.5)
nRBC: 0 % (ref 0.0–0.2)

## 2020-04-29 LAB — FOLATE: Folate: 7.2 ng/mL (ref >5.9)

## 2020-04-29 MED ORDER — NYSTATIN 100000 UNIT/GM EX POWD: 1.0000 "application " | Freq: Three times a day (TID) | CUTANEOUS | 1 refills | Status: DC

## 2020-04-29 NOTE — Progress Notes (Signed)
The patient has been c/o dizziness orthostatic has been done today   Sitting: 101/66 P 89 Standing: 95/54 P 94

## 2020-04-30 LAB — CA 125: Cancer Antigen (CA) 125: 7.9 U/mL (ref 0.0–38.1)

## 2020-04-30 LAB — CANCER ANTIGEN 19-9: CA 19-9: 38 U/mL — ABNORMAL HIGH (ref 0–35)

## 2020-04-30 LAB — CANCER ANTIGEN 27.29: CA 27.29: 22.3 U/mL (ref 0.0–38.6)

## 2020-05-02 ENCOUNTER — Ambulatory Visit: Payer: Medicare PPO | Attending: Orthopaedic Surgery | Admitting: Occupational Therapy

## 2020-05-02 DIAGNOSIS — M25522 Pain in left elbow: Secondary | ICD-10-CM | POA: Insufficient documentation

## 2020-05-02 DIAGNOSIS — M6281 Muscle weakness (generalized): Secondary | ICD-10-CM | POA: Insufficient documentation

## 2020-05-02 DIAGNOSIS — R262 Difficulty in walking, not elsewhere classified: Secondary | ICD-10-CM | POA: Insufficient documentation

## 2020-05-02 DIAGNOSIS — M25622 Stiffness of left elbow, not elsewhere classified: Secondary | ICD-10-CM | POA: Insufficient documentation

## 2020-05-09 ENCOUNTER — Ambulatory Visit: Payer: Medicare PPO | Admitting: Occupational Therapy

## 2020-05-09 ENCOUNTER — Other Ambulatory Visit: Payer: Self-pay

## 2020-05-09 DIAGNOSIS — M6281 Muscle weakness (generalized): Secondary | ICD-10-CM | POA: Diagnosis not present

## 2020-05-09 DIAGNOSIS — R262 Difficulty in walking, not elsewhere classified: Secondary | ICD-10-CM | POA: Diagnosis not present

## 2020-05-09 DIAGNOSIS — M25622 Stiffness of left elbow, not elsewhere classified: Secondary | ICD-10-CM | POA: Diagnosis not present

## 2020-05-09 DIAGNOSIS — M25522 Pain in left elbow: Secondary | ICD-10-CM

## 2020-05-09 NOTE — Therapy (Signed)
[facility] [address], [place], [address] Phone: [phone]   Fax:  [phone]  Occupational Therapy Treatment  Patient Details  Name: Kendra [name] MRN: [id number] Date of Birth: 03/16/1946 Referring Provider (OT): Vance Peper Date: 05/09/2020   OT End of Session - 05/09/20 1501    Visit Number 11    Number of Visits 14    Date for OT Re-Evaluation 05/31/20    OT Start Time 1416    OT Stop Time 1500    OT Time Calculation (min) 44 min    Activity Tolerance Patient tolerated treatment well    Behavior During Therapy [facility] for tasks assessed/performed           Past Medical History:  Diagnosis Date  . Anxiety   . Breast cancer ([place]) 03/2018   right breast cancer at 11:00 and 1:00  . Bursitis    bilateral hips and knees  . Complication of anesthesia   . Depression   . Family history of breast cancer 03/24/2018  . Family history of lung cancer 03/24/2018  . History of alcohol dependence ([place]) 2007   no ETOH; resolved since 2007  . History of hiatal hernia   . History of psychosis 2014   due to sleep disturbance  . Hypertension   . Personal history of radiation therapy 06/2018-07/2018   right breast ca  . PONV (postoperative nausea and vomiting)     Past Surgical History:  Procedure Laterality Date  . BREAST BIOPSY Right 03/14/2018   11:00 DCIS and invasive ductal carcinoma  . BREAST BIOPSY Right 03/14/2018   1:00 Invasive ductal carcinoma  . BREAST LUMPECTOMY Right 04/27/2018   lumpectomy of 11 and 1:00 cancers, clear margins, negative LN  . BREAST LUMPECTOMY WITH RADIOACTIVE SEED AND SENTINEL LYMPH NODE BIOPSY Right 04/27/2018   Procedure: RIGHT BREAST LUMPECTOMY WITH RADIOACTIVE SEED X 2 AND RIGHT SENTINEL LYMPH NODE BIOPSY;  Surgeon: [name], MD;  Location: [place];  Service: General;  Laterality: Right;  . CATARACT EXTRACTION W/PHACO Left  08/30/2019   Procedure: CATARACT EXTRACTION PHACO AND INTRAOCULAR LENS PLACEMENT ([place]) LEFT panoptix toric  01:20.5  12.7%  10.26;  Surgeon: Leandrew Koyanagi, MD;  Location: [place];  Service: Ophthalmology;  Laterality: Left;  . CATARACT EXTRACTION W/PHACO Right 09/20/2019   Procedure: CATARACT EXTRACTION PHACO AND INTRAOCULAR LENS PLACEMENT ([facility]) RIGHT 5.71  00:36.4  15.7%;  Surgeon: Leandrew Koyanagi, MD;  Location: [place];  Service: Ophthalmology;  Laterality: Right;  . LAPAROTOMY N/A 02/10/2018   Procedure: EXPLORATORY LAPAROTOMY;  Surgeon: Isabel Caprice, MD;  Location[facility];  Service: Gynecology;  Laterality: N/A;  . MASS EXCISION  01/2018   abdominal  . OVARIAN CYST REMOVAL    . SALPINGOOPHORECTOMY Bilateral 02/10/2018   Procedure: BILATERAL SALPINGO OOPHORECTOMY; PERITONEAL WASHINGS;  Surgeon: Isabel Caprice, MD;  [facility];  Service: Gynecology;  Laterality: Bilateral;  . TONSILLECTOMY    . TONSILLECTOMY AND ADENOIDECTOMY  1953    There were no vitals filed for this visit.   Subjective Assessment - 05/09/20 1500    Subjective  My green band broke so I did not do my exercises the last 2 wks - but I am stronger - still doing about 1/2 gallon the the most - elbow still straight    Pertinent History Pt fell 11/23/2019 and fracture her R elbow - minimal displaced radius head fx - pt in  sling if she is out and about - but refer to OT for ROM ,PROM and weight limited 1-2 lbs -    Patient Stated Goals I would like the ROM , strength better so I can push up from chair and have no pain    Currently in Pain? No/denies                L AROM for elbow compare to R - same for flexion - extention Strength flexion and extension4+/5 -with end range weaker for elbow extention    Kept at same  dark blue mix with light blue   3sets12 toget fingers into putty - grip about 4 sec into putty and can do R and L   Cont withWall pushupdoing  well with per pt - 3 sets of 10-12pain free Pt to keep hands on shoulder height   able to lift and carry 8# without any compensation at the UE.- except if have to  reach with shoulder - and 9 lbs - no pulling - just heavy   GTB for elbow extention and flexion - bilateral 12 reps  2 sets  GTB D1 and D2 patterns add for shoulder - bilateral 10 reps  No pain or issues can increase in 5-7 days to 2 sets and another 5 days 3 sets  1 x day  Cont with GTB for scapula squeezes  2 sets   And punches with GTB - 2 sets of 12   Increase over the next 2 1/2 wks to 2nd set for all and 3rd set but pain free                       OT Education - 05/09/20 1500    Education Details HEP review again    Person(s) Educated Patient    Methods Explanation;Demonstration;Tactile cues;Verbal cues;Handout    Comprehension Verbal cues required;Returned demonstration;Verbalized understanding            OT Short Term Goals - 02/21/20 1619      OT SHORT TERM GOAL #1   Title Pt to be independent in HEP to increase L elbow extention to -5 and increase strenght to carry more than 5 lbs    Baseline elbow extention AROM -5 and PROM and in session 0 - carry and lift 5 lbs with ease    Status Achieved             OT Long Term Goals - 03/27/20 1444      OT LONG TERM GOAL #1   Title L Elbow extention improve for pt to pull shirt over head, push up and carry more than 8 lbs without increase symptoms    Baseline 0 to -5 for extention , upgrade to 3 lbs for elbow HEP , no pain reported but can carry and lift 5 lbs - but more compensate with body  has some discomfort.  03/27/2020 able to carry up to 6# and able to pull shirt overhead.    Time 4    Period Weeks    Status On-going      OT LONG TERM GOAL #2   Title Pt AROM and strength in L elbow improve to prior level for function    Baseline can do wall pushups , dress , bath, - no pushing up out of bed or chair , groceries, laundery and  fitting sheet still favor.  03-27-20 she is still doing wall push ups, helps with meal prep but  husband cooks, shopping, some difficulty with fitted sheet.    Time 4    Period Weeks    Status On-going                 Plan - 05/09/20 1501    Clinical Impression Statement Pt is about 5 1/2 months out from L elbow fx - pt diagnosis in meantime with Parkinsons- pt L elbow extention increaseto WNL and able this date to lift and carry 8 lbs , and 9 lbs was heavy but no pull on elbow -  pt's green  band broked 2 wks ago and pt did not do her HEP - review HEP with pt and pt to do it again 2 sets for R arm ,and bicep and rowing but  1 set with the L with D1 patterns up - pt to increase over the next 2 1/2 wks to 2nd and 3rd set pain free    OT Occupational Profile and History Problem Focused Assessment - Including review of records relating to presenting problem    Occupational performance deficits (Please refer to evaluation for details): ADL's;IADL's;Play;Leisure;Social Participation    Body Structure / Function / Physical Skills ADL;Flexibility;ROM;UE functional use;Pain;Strength;IADL    Rehab Potential Good    Clinical Decision Making Limited treatment options, no task modification necessary    Comorbidities Affecting Occupational Performance: May have comorbidities impacting occupational performance    Modification or Assistance to Complete Evaluation  No modification of tasks or assist necessary to complete eval    OT Frequency Biweekly    OT Duration 4 weeks    OT Treatment/Interventions Self-care/ADL training;Therapeutic exercise;Patient/family education;Cryotherapy;Paraffin;Moist Heat;Manual Therapy;Passive range of motion;Therapeutic activities    Plan assess progress with HEP    OT Home Exercise Plan see pt instruction           Patient will benefit from skilled therapeutic intervention in order to improve the following deficits and impairments:   Body Structure / Function /  Physical Skills: ADL, Flexibility, ROM, UE functional use, Pain, Strength, IADL       Visit Diagnosis: Stiffness of left elbow, not elsewhere classified  Pain in left elbow    Problem List Patient Active Problem List   Diagnosis Date Noted  . Candidal skin infection 04/29/2020  . Parkinson disease ([place]) 02/22/2020  . 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 Bipolar disorder, in full remission, most recent episode depressed ([place]) 11/08/2019  . Avitaminosis D 08/24/2019  . Insomnia due to mental condition 05/31/2019  . Bipolar I disorder, most recent episode depressed ([place]) 05/31/2019  . Mild cognitive disorder 05/31/2019  . Alcohol use disorder, moderate, in sustained remission ([place]) 05/31/2019  . Elevated tumor markers 05/27/2019  . GAD (generalized anxiety disorder) 04/10/2019  . Altered mental status 04/10/2019  . MDD (major depressive disorder), severe ([place]) 01/31/2019  . High serum carbohydrate antigen 19-9 (CA19-9) 12/01/2018  . Adjustment disorder with anxious mood 11/24/2018  . Essential hypertension 11/24/2018  . B12 deficiency 10/17/2018  . Incisional hernia, without obstruction or gangrene 09/12/2018  . Genetic testing 04/07/2018  . Family history of lung cancer 03/24/2018  . Malignant neoplasm of upper-outer quadrant of right breast in female, estrogen receptor positive ([place]) 03/17/2018  . Malignant neoplasm of upper-inner quadrant of right breast in female, estrogen receptor positive ([place]) 03/17/2018  . History of alcohol dependence ([place]) 02/07/2018  . History of psychosis 02/07/2018  . History of insomnia 02/07/2018    Rosalyn Gess 05/09/2020, 3:37 PM  [place]  [facility] [address], [place], [address] Phone: [phone]   Fax:  [phone]  Name: Kendra [name] MRN: [id number] Date of Birth: [birth year]

## 2020-05-14 ENCOUNTER — Other Ambulatory Visit: Payer: Self-pay

## 2020-05-14 ENCOUNTER — Ambulatory Visit: Payer: Medicare PPO

## 2020-05-14 DIAGNOSIS — M6281 Muscle weakness (generalized): Secondary | ICD-10-CM | POA: Diagnosis not present

## 2020-05-14 DIAGNOSIS — M25522 Pain in left elbow: Secondary | ICD-10-CM | POA: Diagnosis not present

## 2020-05-14 DIAGNOSIS — M25622 Stiffness of left elbow, not elsewhere classified: Secondary | ICD-10-CM | POA: Diagnosis not present

## 2020-05-14 DIAGNOSIS — R262 Difficulty in walking, not elsewhere classified: Secondary | ICD-10-CM

## 2020-05-15 NOTE — Therapy (Signed)
[place] [facility] [address] [place], [address] Phone: [phone]   Fax:  [phone]  Physical Therapy Treatment/ Progress Note  Patient Details  Name: Kendra [name] MRN: [id number] Date of Birth: 08/23/1946 No data recorded  Reporting Period: 03/20/2020 - 05/13/2020  Encounter Date: 05/14/2020   PT End of Session - 05/14/20 1356    Visit Number 43    Number of Visits 50    Date for PT Re-Evaluation 05/01/20    Authorization Type 3/10    Authorization Time Period until 06/12/2020    PT Start Time 1345    PT Stop Time 1430    PT Time Calculation (min) 45 min    Equipment Utilized During Treatment Gait belt    Activity Tolerance Patient tolerated treatment well;No increased pain    Behavior During Therapy WFL for tasks assessed/performed           Past Medical History:  Diagnosis Date  . Anxiety   . Breast cancer ([place]) 03/2018   right breast cancer at 11:00 and 1:00  . Bursitis    bilateral hips and knees  . Complication of anesthesia   . Depression   . Family history of breast cancer 03/24/2018  . Family history of lung cancer 03/24/2018  . History of alcohol dependence ([place]) 2007   no ETOH; resolved since 2007  . History of hiatal hernia   . History of psychosis 2014   due to sleep disturbance  . Hypertension   . Personal history of radiation therapy 06/2018-07/2018   right breast ca  . PONV (postoperative nausea and vomiting)     Past Surgical History:  Procedure Laterality Date  . BREAST BIOPSY Right 03/14/2018   11:00 DCIS and invasive ductal carcinoma  . BREAST BIOPSY Right 03/14/2018   1:00 Invasive ductal carcinoma  . BREAST LUMPECTOMY Right 04/27/2018   lumpectomy of 11 and 1:00 cancers, clear margins, negative LN  . BREAST LUMPECTOMY WITH RADIOACTIVE SEED AND SENTINEL LYMPH NODE BIOPSY Right 04/27/2018   Procedure: RIGHT BREAST LUMPECTOMY WITH RADIOACTIVE SEED X 2 AND RIGHT SENTINEL  LYMPH NODE BIOPSY;  Surgeon: [name], MD;  Location: [place];  Service: General;  Laterality: Right;  . CATARACT EXTRACTION W/PHACO Left 08/30/2019   Procedure: CATARACT EXTRACTION PHACO AND INTRAOCULAR LENS PLACEMENT ([place]) LEFT panoptix toric  01:20.5  12.7%  10.26;  Surgeon: Leandrew Koyanagi, MD;  Location: [place];  Service: Ophthalmology;  Laterality: Left;  . CATARACT EXTRACTION W/PHACO Right 09/20/2019   Procedure: CATARACT EXTRACTION PHACO AND INTRAOCULAR LENS PLACEMENT ([facility]) RIGHT 5.71  00:36.4  15.7%;  Surgeon: Leandrew Koyanagi, MD;  Location: [facility];  Service: Ophthalmology;  Laterality: Right;  . LAPAROTOMY N/A 02/10/2018   Procedure: EXPLORATORY LAPAROTOMY;  Surgeon: Isabel Caprice, MD;  [facility];  Service: Gynecology;  Laterality: N/A;  . MASS EXCISION  01/2018   abdominal  . OVARIAN CYST REMOVAL    . SALPINGOOPHORECTOMY Bilateral 02/10/2018   Procedure: BILATERAL SALPINGO OOPHORECTOMY; PERITONEAL WASHINGS;  Surgeon: Isabel Caprice, MD;  [facility];  Service: Gynecology;  Laterality: Bilateral;  . TONSILLECTOMY    . TONSILLECTOMY AND ADENOIDECTOMY  1953    There were no vitals filed for this visit.   Subjective Assessment - 05/14/20 1351    Subjective Patient reports she conitnues to have vestibular based symptoms and dizziness while walking. Otherwise, patient states she has been performing her exercises.    Pertinent  History Patient report unclear regarding onset. Visited MD on Sept 16 and received injection with relief for approximately 1 month. Reports she cannot receive an additional injection pending eye surgery. Received dx from MD of hip bursitis in B hips. Reports pain with walking, stanindg, sitting 5/10 current 7/10 worst 3/10 best, with temporary relief when she is sitting "just right" and with naproxen. Agg: activity. Cormorbidities: hx of breast cancer, HTN, MCI, insomnia, cataracts     Patient Stated Goals Be able to move without hurting, walk 1 hour    Currently in Pain? No/denies                TREATMENT Therapeutic Exercise Hip circles in standing with YTB around ankles - x 10 B Hip adduction in standing with GTB - 2 x 10 Hip machine hip adduction 55# - x 12, 70# x 12, 85# x6 Hip machine hip extension 85# - x 15 Hip machine hip abduction 40# - x 12 Stool scoots in sitting - x158ft, x 33ft Knee flexion in the machine - single leg 25# x 10, 20# x 10  Performed exercises to decrease pain and improve strength       PT Education - 05/14/20 1353    Education provided Yes    Education Details form/technique with exercise    Person(s) Educated Patient    Methods Explanation;Demonstration    Comprehension Verbalized understanding;Returned demonstration            PT Short Term Goals - 09/12/19 1419      PT SHORT TERM GOAL #1   Title Patient will demonstrate complaince with HEP to speed recovery and reduce total number of visits.    Baseline HEP given    Time 2    Period Weeks    Status New    Target Date 09/27/19             PT Long Term Goals - 05/14/20 1407      PT LONG TERM GOAL #1   Title Patient will improve hip strength to 4/5 grossly to facilitate improved mobility with ADLs.    Baseline 3, 4-; 12/22 deferred to NV; 11/09/2019: 4-/5; 12/07/2019 L hip flexor 4, abd and ext 3+; 01/29/2020: 4/5 for most hip motions, 3+/5 for abd, ext; 03/20/2020: 4/5 for hip movements    Time 8    Period Weeks    Status Achieved      PT LONG TERM GOAL #2   Title Patient will demonstrate walking tolerance increased to 30 minutes without increase in symptoms to demonstrate improved ability to perform walking program for cardiovascular health.    Baseline 10 minute tolerance (patient report); 12/22 10 minutes (per pt report); 11/09/2019: 7 min; 12/07/2019 unable to assess due to icy streets;  01/29/2020: 10 min; 03/20/2020 10 min; 05/14/2020: 10 min x 3 per day    Time  8    Period Weeks    Status On-going      PT LONG TERM GOAL #3   Title Patient will report worst pain of 3/10 during therapy to demonstrate reduced disability    Baseline Worst 7/10; 12/22 worst 9/10; 11/09/2019: 9/10; 12/07/2019 7/10; 01/29/2020: 7/10; 05/14/2020: 0/ 10    Time 8    Period Weeks    Status Achieved      PT LONG TERM GOAL #4   Title Patient will demonstrate ability to perform all bed mobility maneuvers without increase in pain for reduced disability with mobility and transfers with ADLs.  Baseline Reports pain with bed mobility supine <> sidelying L/R, sidelying to sitting EOB; 12/22 pt able to perform I without increased pain    Time 8    Period Weeks    Status Achieved      PT LONG TERM GOAL #5   Title Patient will improve 10MW speed to 1.0 m/sec to meet norms for minimal fall risk and full community ambulation.    Baseline 0.88 m/sec; 12/22 0.96 m/sec; 11/09/2019: deferred; 12/07/2019 0.90 m/sec; 01/29/2020: .9  m/sec; 03/20/2020: .73m/s; 05/14/2020: . 9 m/s    Time 8    Period Weeks    Status On-going      Additional Long Term Goals   Additional Long Term Goals Yes      PT LONG TERM GOAL #6   Title Patient will increase 6MWT by 200 ft to demonstrate improvement in functional capacity for transition to cardiac walking program    Baseline 6MWT = 1195; 1/7/20201: deferred; 12/07/2019 1095; 01/29/2020: deferred secondary to fatigue; 03/20/2020: 1050ft    Time 8    Period Weeks    Status Deferred      PT LONG TERM GOAL #7   Title Patient will improve her 5xsts to under 15 sec to indicate singificnat improvement in LE functional strength and decrease in fall risk.    Baseline 24.5 sec; 03/20/2020: 19.19 sec    Time 6    Period Weeks    Status On-going      PT LONG TERM GOAL #8   Title Patient will improve FGA to 28/30 to indicate improvement in dynamic balance and improvement in stability.    Baseline 23/30    Time 6    Period Weeks    Status New                   Patient will benefit from skilled therapeutic intervention in order to improve the following deficits and impairments:     Visit Diagnosis: Muscle weakness (generalized)  Difficulty in walking, not elsewhere classified     Problem List Patient Active Problem List   Diagnosis Date Noted  . Candidal skin infection 04/29/2020  . Parkinson disease ([place]) 02/22/2020  . 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 Bipolar disorder, in full remission, most recent episode depressed ([place]) 11/08/2019  . Avitaminosis D 08/24/2019  . Insomnia due to mental condition 05/31/2019  . Bipolar I disorder, most recent episode depressed ([place]) 05/31/2019  . Mild cognitive disorder 05/31/2019  . Alcohol use disorder, moderate, in sustained remission ([place]) 05/31/2019  . Elevated tumor markers 05/27/2019  . GAD (generalized anxiety disorder) 04/10/2019  . Altered mental status 04/10/2019  . MDD (major depressive disorder), severe ([place]) 01/31/2019  . High serum carbohydrate antigen 19-9 (CA19-9) 12/01/2018  . Adjustment disorder with anxious mood 11/24/2018  . Essential hypertension 11/24/2018  . B12 deficiency 10/17/2018  . Incisional hernia, without obstruction or gangrene 09/12/2018  . Genetic testing 04/07/2018  . Family history of lung cancer 03/24/2018  . Malignant neoplasm of upper-outer quadrant of right breast in female, estrogen receptor positive ([place]) 03/17/2018  . Malignant neoplasm of upper-inner quadrant of right breast in female, estrogen receptor positive ([place]) 03/17/2018  . History of alcohol dependence ([place]) 02/07/2018  . History of psychosis 02/07/2018  . History of insomnia 02/07/2018    Blythe Stanford, PT DPT 05/15/2020, 9:33 AM  [facility] [address] [place], [address] Phone: [phone]   Fax:  [phone]  Name: Kendra [name] MRN: [id number] Date of Birth: May 02, 1946

## 2020-05-16 ENCOUNTER — Other Ambulatory Visit: Payer: Self-pay

## 2020-05-16 ENCOUNTER — Ambulatory Visit: Payer: Medicare PPO | Admitting: Licensed Clinical Social Worker

## 2020-05-16 ENCOUNTER — Ambulatory Visit: Payer: Medicare PPO

## 2020-05-16 DIAGNOSIS — M6281 Muscle weakness (generalized): Secondary | ICD-10-CM

## 2020-05-16 DIAGNOSIS — M25622 Stiffness of left elbow, not elsewhere classified: Secondary | ICD-10-CM | POA: Diagnosis not present

## 2020-05-16 DIAGNOSIS — R262 Difficulty in walking, not elsewhere classified: Secondary | ICD-10-CM

## 2020-05-16 DIAGNOSIS — M25522 Pain in left elbow: Secondary | ICD-10-CM | POA: Diagnosis not present

## 2020-05-16 NOTE — Therapy (Signed)
[facility] [address] [place], [address] Phone: [phone]   Fax:  [phone]  Physical Therapy Treatment  Patient Details  Name: Kendra [name] MRN: [id number] Date of Birth: 06-Nov-1945 No data recorded  Encounter Date: 05/16/2020   PT End of Session - 05/16/20 1351    Visit Number 44    Number of Visits 50    Date for PT Re-Evaluation 05/01/20    Authorization Type 4/10    Authorization Time Period until 06/12/2020    PT Start Time 1345    PT Stop Time 1430    PT Time Calculation (min) 45 min    Equipment Utilized During Treatment Gait belt    Activity Tolerance Patient tolerated treatment well;No increased pain    Behavior During Therapy WFL for tasks assessed/performed           Past Medical History:  Diagnosis Date  . Anxiety   . Breast cancer ([place]) 03/2018   right breast cancer at 11:00 and 1:00  . Bursitis    bilateral hips and knees  . Complication of anesthesia   . Depression   . Family history of breast cancer 03/24/2018  . Family history of lung cancer 03/24/2018  . History of alcohol dependence ([place]) 2007   no ETOH; resolved since 2007  . History of hiatal hernia   . History of psychosis 2014   due to sleep disturbance  . Hypertension   . Personal history of radiation therapy 06/2018-07/2018   right breast ca  . PONV (postoperative nausea and vomiting)     Past Surgical History:  Procedure Laterality Date  . BREAST BIOPSY Right 03/14/2018   11:00 DCIS and invasive ductal carcinoma  . BREAST BIOPSY Right 03/14/2018   1:00 Invasive ductal carcinoma  . BREAST LUMPECTOMY Right 04/27/2018   lumpectomy of 11 and 1:00 cancers, clear margins, negative LN  . BREAST LUMPECTOMY WITH RADIOACTIVE SEED AND SENTINEL LYMPH NODE BIOPSY Right 04/27/2018   Procedure: RIGHT BREAST LUMPECTOMY WITH RADIOACTIVE SEED X 2 AND RIGHT SENTINEL LYMPH NODE BIOPSY;  Surgeon: [name], MD;   Location: [facility];  Service: General;  Laterality: Right;  . CATARACT EXTRACTION W/PHACO Left 08/30/2019   Procedure: CATARACT EXTRACTION PHACO AND INTRAOCULAR LENS PLACEMENT ([place]) LEFT panoptix toric  01:20.5  12.7%  10.26;  Surgeon: Leandrew Koyanagi, MD;  Location: [facility];  Service: Ophthalmology;  Laterality: Left;  . CATARACT EXTRACTION W/PHACO Right 09/20/2019   Procedure: CATARACT EXTRACTION PHACO AND INTRAOCULAR LENS PLACEMENT ([facility]) RIGHT 5.71  00:36.4  15.7%;  Surgeon: Leandrew Koyanagi, MD;  Location: [facility];  Service: Ophthalmology;  Laterality: Right;  . LAPAROTOMY N/A 02/10/2018   Procedure: EXPLORATORY LAPAROTOMY;  Surgeon: Isabel Caprice, MD;  Location[facility];  Service: Gynecology;  Laterality: N/A;  . MASS EXCISION  01/2018   abdominal  . OVARIAN CYST REMOVAL    . SALPINGOOPHORECTOMY Bilateral 02/10/2018   Procedure: BILATERAL SALPINGO OOPHORECTOMY; PERITONEAL WASHINGS;  Surgeon: Isabel Caprice, MD;  [facility];  Service: Gynecology;  Laterality: Bilateral;  . TONSILLECTOMY    . TONSILLECTOMY AND ADENOIDECTOMY  1953    There were no vitals filed for this visit.   Subjective Assessment - 05/16/20 1349    Subjective Patient states she has been feeling dizziness with walking and standing for prolonged periods of time.    Pertinent History Patient report unclear regarding onset. Visited MD on Sept 16 and received injection  with relief for approximately 1 month. Reports she cannot receive an additional injection pending eye surgery. Received dx from MD of hip bursitis in B hips. Reports pain with walking, stanindg, sitting 5/10 current 7/10 worst 3/10 best, with temporary relief when she is sitting "just right" and with naproxen. Agg: activity. Cormorbidities: hx of breast cancer, HTN, MCI, insomnia, cataracts    Patient Stated Goals Be able to move without hurting, walk 1 hour    Currently in Pain? No/denies                TREATMENT Therapeutic Exercise Reaching out, down, up and back in sitting to break small movement patterns secondary to Parkinsons - x 10 Step forward clap overhead, step backward clap behind to break small movement patterns secondary to Parkinsons - x 10  Feet together on airex pad with head turns - x 15 Step ups onto airex pad with UE support - x 15 Feet apart with VOR --  2 x 10  Tandem ambulation - 4 x 10 Ambulation with head turns up/down, left/right - 4 x 76ft each direction Ambulation with head turns up/down, left/right - 2 x 67ft each direction VOR  Performed exercises to address vestibular and to disrupt small movements    PT Education - 05/16/20 1350    Education provided Yes    Education Details form/technique with exercise    Person(s) Educated Patient    Methods Explanation;Demonstration    Comprehension Verbalized understanding;Returned demonstration            PT Short Term Goals - 09/12/19 1419      PT SHORT TERM GOAL #1   Title Patient will demonstrate complaince with HEP to speed recovery and reduce total number of visits.    Baseline HEP given    Time 2    Period Weeks    Status New    Target Date 09/27/19             PT Long Term Goals - 05/14/20 1407      PT LONG TERM GOAL #1   Title Patient will improve hip strength to 4/5 grossly to facilitate improved mobility with ADLs.    Baseline 3, 4-; 12/22 deferred to NV; 11/09/2019: 4-/5; 12/07/2019 L hip flexor 4, abd and ext 3+; 01/29/2020: 4/5 for most hip motions, 3+/5 for abd, ext; 03/20/2020: 4/5 for hip movements    Time 8    Period Weeks    Status Achieved      PT LONG TERM GOAL #2   Title Patient will demonstrate walking tolerance increased to 30 minutes without increase in symptoms to demonstrate improved ability to perform walking program for cardiovascular health.    Baseline 10 minute tolerance (patient report); 12/22 10 minutes (per pt report); 11/09/2019: 7 min; 12/07/2019 unable to  assess due to icy streets;  01/29/2020: 10 min; 03/20/2020 10 min; 05/14/2020: 10 min x 3 per day    Time 8    Period Weeks    Status On-going      PT LONG TERM GOAL #3   Title Patient will report worst pain of 3/10 during therapy to demonstrate reduced disability    Baseline Worst 7/10; 12/22 worst 9/10; 11/09/2019: 9/10; 12/07/2019 7/10; 01/29/2020: 7/10; 05/14/2020: 0/ 10    Time 8    Period Weeks    Status Achieved      PT LONG TERM GOAL #4   Title Patient will demonstrate ability to perform all bed mobility maneuvers without  increase in pain for reduced disability with mobility and transfers with ADLs.    Baseline Reports pain with bed mobility supine <> sidelying L/R, sidelying to sitting EOB; 12/22 pt able to perform I without increased pain    Time 8    Period Weeks    Status Achieved      PT LONG TERM GOAL #5   Title Patient will improve 10MW speed to 1.0 m/sec to meet norms for minimal fall risk and full community ambulation.    Baseline 0.88 m/sec; 12/22 0.96 m/sec; 11/09/2019: deferred; 12/07/2019 0.90 m/sec; 01/29/2020: .9  m/sec; 03/20/2020: .8m/s; 05/14/2020: . 9 m/s    Time 8    Period Weeks    Status On-going      Additional Long Term Goals   Additional Long Term Goals Yes      PT LONG TERM GOAL #6   Title Patient will increase 6MWT by 200 ft to demonstrate improvement in functional capacity for transition to cardiac walking program    Baseline 6MWT = 1195; 1/7/20201: deferred; 12/07/2019 1095; 01/29/2020: deferred secondary to fatigue; 03/20/2020: 1034ft    Time 8    Period Weeks    Status Deferred      PT LONG TERM GOAL #7   Title Patient will improve her 5xsts to under 15 sec to indicate singificnat improvement in LE functional strength and decrease in fall risk.    Baseline 24.5 sec; 03/20/2020: 19.19 sec    Time 6    Period Weeks    Status On-going      PT LONG TERM GOAL #8   Title Patient will improve FGA to 28/30 to indicate improvement in dynamic balance and  improvement in stability.    Baseline 23/30    Time 6    Period Weeks    Status New                 Plan - 05/16/20 1413    Clinical Impression Statement Continued to perform exercises that challeneged both the vestibular system and balance system throughout today's session. Continued to focus on improving ambulation with head turns which patient performs well however requires x2 minA to maintain balance from PT. Patient continues to have difficulty higher level dynamic balance exercises such as tandem stance ambulation. Patient will benefit from further skilled therapy to return to prior level of function.    Personal Factors and Comorbidities Age;Comorbidity 3+;Time since onset of injury/illness/exacerbation;Comorbidity 2    Comorbidities MCI, HTN, CA, cataracts    Examination-Activity Limitations Bend;Sit;Squat;Stairs;Stand;Sleep    Examination-Participation Restrictions Cleaning;Community Activity    Stability/Clinical Decision Making Evolving/Moderate complexity    Rehab Potential Fair    Clinical Impairments Affecting Rehab Potential MCI    PT Frequency 2x / week    PT Duration 6 weeks    PT Treatment/Interventions ADLs/Self Care Home Management;Therapeutic exercise;Patient/family education;Neuromuscular re-education;Therapeutic activities;Functional mobility training;Balance training;Manual techniques;Gait training;Stair training;Moist Heat;Electrical Stimulation;Cryotherapy;Taping;Dry needling;Energy conservation;Passive range of motion;Joint Manipulations    PT Next Visit Plan Progress glute therex, pelvic-hip dissociation; check and progress HEP with stretches    PT Home Exercise Plan lateral step ups, step ups, hip kickers ext/abd red theraband    Consulted and Agree with Plan of Care Patient           Patient will benefit from skilled therapeutic intervention in order to improve the following deficits and impairments:  Decreased range of motion, Postural dysfunction,  Pain, Abnormal gait, Decreased activity tolerance, Decreased coordination, Decreased endurance, Decreased balance, Decreased  mobility, Difficulty walking, Hypomobility, Increased muscle spasms, Impaired perceived functional ability, Decreased strength, Impaired flexibility  Visit Diagnosis: Muscle weakness (generalized)  Difficulty in walking, not elsewhere classified     Problem List Patient Active Problem List   Diagnosis Date Noted  . Candidal skin infection 04/29/2020  . Parkinson disease ([place]) 02/22/2020  . 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 Bipolar disorder, in full remission, most recent episode depressed ([place]) 11/08/2019  . Avitaminosis D 08/24/2019  . Insomnia due to mental condition 05/31/2019  . Bipolar I disorder, most recent episode depressed ([place]) 05/31/2019  . Mild cognitive disorder 05/31/2019  . Alcohol use disorder, moderate, in sustained remission ([place]) 05/31/2019  . Elevated tumor markers 05/27/2019  . GAD (generalized anxiety disorder) 04/10/2019  . Altered mental status 04/10/2019  . MDD (major depressive disorder), severe ([place]) 01/31/2019  . High serum carbohydrate antigen 19-9 (CA19-9) 12/01/2018  . Adjustment disorder with anxious mood 11/24/2018  . Essential hypertension 11/24/2018  . B12 deficiency 10/17/2018  . Incisional hernia, without obstruction or gangrene 09/12/2018  . Genetic testing 04/07/2018  . Family history of lung cancer 03/24/2018  . Malignant neoplasm of upper-outer quadrant of right breast in female, estrogen receptor positive ([place]) 03/17/2018  . Malignant neoplasm of upper-inner quadrant of right breast in female, estrogen receptor positive ([place]) 03/17/2018  . History of alcohol dependence ([place]) 02/07/2018  . History of psychosis 02/07/2018  . History of insomnia 02/07/2018    Blythe Stanford, PT DPT 05/16/2020, 2:33 PM  [facility] [address] [place],  [address] Phone: [phone]   Fax:  [phone]  Name: Kendra [name] MRN: [id number] Date of Birth: 1946-07-31

## 2020-05-21 ENCOUNTER — Ambulatory Visit: Payer: Medicare PPO

## 2020-05-21 ENCOUNTER — Other Ambulatory Visit: Payer: Self-pay

## 2020-05-21 DIAGNOSIS — M25622 Stiffness of left elbow, not elsewhere classified: Secondary | ICD-10-CM | POA: Diagnosis not present

## 2020-05-21 DIAGNOSIS — M6281 Muscle weakness (generalized): Secondary | ICD-10-CM | POA: Diagnosis not present

## 2020-05-21 DIAGNOSIS — R262 Difficulty in walking, not elsewhere classified: Secondary | ICD-10-CM | POA: Diagnosis not present

## 2020-05-21 DIAGNOSIS — M25522 Pain in left elbow: Secondary | ICD-10-CM | POA: Diagnosis not present

## 2020-05-21 NOTE — Therapy (Signed)
[facility] [address], [place], [address] Phone: [phone]   Fax:  [phone]  Physical Therapy Treatment  Patient Details  Name: Kendra [name] MRN: [id number] Date of Birth: Aug 04, 1946 No data recorded  Encounter Date: 05/21/2020   PT End of Session - 05/21/20 1400    Visit Number 45    Number of Visits 50    Date for PT Re-Evaluation 06/12/20    Authorization Type 5/10    Authorization Time Period until 06/12/2020    PT Start Time 1345    PT Stop Time 1430    PT Time Calculation (min) 45 min    Equipment Utilized During Treatment Gait belt    Activity Tolerance Patient tolerated treatment well;No increased pain    Behavior During Therapy WFL for tasks assessed/performed           Past Medical History:  Diagnosis Date  . Anxiety   . Breast cancer ([place]) 03/2018   right breast cancer at 11:00 and 1:00  . Bursitis    bilateral hips and knees  . Complication of anesthesia   . Depression   . Family history of breast cancer 03/24/2018  . Family history of lung cancer 03/24/2018  . History of alcohol dependence ([place]) 2007   no ETOH; resolved since 2007  . History of hiatal hernia   . History of psychosis 2014   due to sleep disturbance  . Hypertension   . Personal history of radiation therapy 06/2018-07/2018   right breast ca  . PONV (postoperative nausea and vomiting)     Past Surgical History:  Procedure Laterality Date  . BREAST BIOPSY Right 03/14/2018   11:00 DCIS and invasive ductal carcinoma  . BREAST BIOPSY Right 03/14/2018   1:00 Invasive ductal carcinoma  . BREAST LUMPECTOMY Right 04/27/2018   lumpectomy of 11 and 1:00 cancers, clear margins, negative LN  . BREAST LUMPECTOMY WITH RADIOACTIVE SEED AND SENTINEL LYMPH NODE BIOPSY Right 04/27/2018   Procedure: RIGHT BREAST LUMPECTOMY WITH RADIOACTIVE SEED X 2 AND RIGHT SENTINEL LYMPH NODE BIOPSY;  Surgeon: [name], MD;   Location: [facility];  Service: General;  Laterality: Right;  . CATARACT EXTRACTION W/PHACO Left 08/30/2019   Procedure: CATARACT EXTRACTION PHACO AND INTRAOCULAR LENS PLACEMENT ([place]) LEFT panoptix toric  01:20.5  12.7%  10.26;  Surgeon: Leandrew Koyanagi, MD;  Location: [facility];  Service: Ophthalmology;  Laterality: Left;  . CATARACT EXTRACTION W/PHACO Right 09/20/2019   Procedure: CATARACT EXTRACTION PHACO AND INTRAOCULAR LENS PLACEMENT ([facility]) RIGHT 5.71  00:36.4  15.7%;  Surgeon: Leandrew Koyanagi, MD;  Location: [facility];  Service: Ophthalmology;  Laterality: Right;  . LAPAROTOMY N/A 02/10/2018   Procedure: EXPLORATORY LAPAROTOMY;  Surgeon: Isabel Caprice, MD;  [facility];  Service: Gynecology;  Laterality: N/A;  . MASS EXCISION  01/2018   abdominal  . OVARIAN CYST REMOVAL    . SALPINGOOPHORECTOMY Bilateral 02/10/2018   Procedure: BILATERAL SALPINGO OOPHORECTOMY; PERITONEAL WASHINGS;  Surgeon: Isabel Caprice, MD;  [facility];  Service: Gynecology;  Laterality: Bilateral;  . TONSILLECTOMY    . TONSILLECTOMY AND ADENOIDECTOMY  1953    There were no vitals filed for this visit.   Subjective Assessment - 05/21/20 1353    Subjective Patient states she continues to have increased dizziness especially when walking.    Pertinent History Patient report unclear regarding onset. Visited MD on Sept 16 and received injection with relief for approximately 1  month. Reports she cannot receive an additional injection pending eye surgery. Received dx from MD of hip bursitis in B hips. Reports pain with walking, stanindg, sitting 5/10 current 7/10 worst 3/10 best, with temporary relief when she is sitting "just right" and with naproxen. Agg: activity. Cormorbidities: hx of breast cancer, HTN, MCI, insomnia, cataracts    Patient Stated Goals Be able to move without hurting, walk 1 hour    Currently in Pain? No/denies                 TREATMENT Therapeutic Exercise Reaching out, down, up and back in sitting to break small movement patterns secondary to Parkinsons - x 10 Rotational step ups and reaching out - x 10 B  Step forward clap overhead, step backward clap behind to break small movement patterns secondary to Parkinsons - x 10 B Wide stance reaching with the arms - x 10  Ambulation with head turns up/down, left/right - 2 x 70ft each direction VOR Feet together on airex pad with head turns - 2 x 10 VOR up/down; left/right   Performed exercises to address vestibular and to disrupt small movements      PT Education - 05/21/20 1354    Education provided Yes    Education Details form/technique with exercise    Person(s) Educated Patient    Methods Explanation;Demonstration    Comprehension Verbalized understanding;Returned demonstration            PT Short Term Goals - 09/12/19 1419      PT SHORT TERM GOAL #1   Title Patient will demonstrate complaince with HEP to speed recovery and reduce total number of visits.    Baseline HEP given    Time 2    Period Weeks    Status New    Target Date 09/27/19             PT Long Term Goals - 05/14/20 1407      PT LONG TERM GOAL #1   Title Patient will improve hip strength to 4/5 grossly to facilitate improved mobility with ADLs.    Baseline 3, 4-; 12/22 deferred to NV; 11/09/2019: 4-/5; 12/07/2019 L hip flexor 4, abd and ext 3+; 01/29/2020: 4/5 for most hip motions, 3+/5 for abd, ext; 03/20/2020: 4/5 for hip movements    Time 8    Period Weeks    Status Achieved      PT LONG TERM GOAL #2   Title Patient will demonstrate walking tolerance increased to 30 minutes without increase in symptoms to demonstrate improved ability to perform walking program for cardiovascular health.    Baseline 10 minute tolerance (patient report); 12/22 10 minutes (per pt report); 11/09/2019: 7 min; 12/07/2019 unable to assess due to icy streets;  01/29/2020: 10 min; 03/20/2020 10 min;  05/14/2020: 10 min x 3 per day    Time 8    Period Weeks    Status On-going      PT LONG TERM GOAL #3   Title Patient will report worst pain of 3/10 during therapy to demonstrate reduced disability    Baseline Worst 7/10; 12/22 worst 9/10; 11/09/2019: 9/10; 12/07/2019 7/10; 01/29/2020: 7/10; 05/14/2020: 0/ 10    Time 8    Period Weeks    Status Achieved      PT LONG TERM GOAL #4   Title Patient will demonstrate ability to perform all bed mobility maneuvers without increase in pain for reduced disability with mobility and transfers with ADLs.  Baseline Reports pain with bed mobility supine <> sidelying L/R, sidelying to sitting EOB; 12/22 pt able to perform I without increased pain    Time 8    Period Weeks    Status Achieved      PT LONG TERM GOAL #5   Title Patient will improve 10MW speed to 1.0 m/sec to meet norms for minimal fall risk and full community ambulation.    Baseline 0.88 m/sec; 12/22 0.96 m/sec; 11/09/2019: deferred; 12/07/2019 0.90 m/sec; 01/29/2020: .9  m/sec; 03/20/2020: .91m/s; 05/14/2020: . 9 m/s    Time 8    Period Weeks    Status On-going      Additional Long Term Goals   Additional Long Term Goals Yes      PT LONG TERM GOAL #6   Title Patient will increase 6MWT by 200 ft to demonstrate improvement in functional capacity for transition to cardiac walking program    Baseline 6MWT = 1195; 1/7/20201: deferred; 12/07/2019 1095; 01/29/2020: deferred secondary to fatigue; 03/20/2020: 1074ft    Time 8    Period Weeks    Status Deferred      PT LONG TERM GOAL #7   Title Patient will improve her 5xsts to under 15 sec to indicate singificnat improvement in LE functional strength and decrease in fall risk.    Baseline 24.5 sec; 03/20/2020: 19.19 sec    Time 6    Period Weeks    Status On-going      PT LONG TERM GOAL #8   Title Patient will improve FGA to 28/30 to indicate improvement in dynamic balance and improvement in stability.    Baseline 23/30    Time 6    Period Weeks     Status New                 Plan - 05/21/20 1401    Clinical Impression Statement Conitnued to focus on improving breaking up small movements with mobility based and reaching exercises. Patient able to tolerate greater amount of challenge in terms of challenging her vestibular system indicating fucntional carryover. Patient continues to have epidsodes of LOB but is able to tolerate with minA assistance. Patient will benefit from furhter skilled therapy to return to prior level of function.    Personal Factors and Comorbidities Age;Comorbidity 3+;Time since onset of injury/illness/exacerbation;Comorbidity 2    Comorbidities MCI, HTN, CA, cataracts    Examination-Activity Limitations Bend;Sit;Squat;Stairs;Stand;Sleep    Examination-Participation Restrictions Cleaning;Community Activity    Stability/Clinical Decision Making Evolving/Moderate complexity    Rehab Potential Fair    Clinical Impairments Affecting Rehab Potential MCI    PT Frequency 2x / week    PT Duration 6 weeks    PT Treatment/Interventions ADLs/Self Care Home Management;Therapeutic exercise;Patient/family education;Neuromuscular re-education;Therapeutic activities;Functional mobility training;Balance training;Manual techniques;Gait training;Stair training;Moist Heat;Electrical Stimulation;Cryotherapy;Taping;Dry needling;Energy conservation;Passive range of motion;Joint Manipulations    PT Next Visit Plan Progress glute therex, pelvic-hip dissociation; check and progress HEP with stretches    PT Home Exercise Plan lateral step ups, step ups, hip kickers ext/abd red theraband    Consulted and Agree with Plan of Care Patient           Patient will benefit from skilled therapeutic intervention in order to improve the following deficits and impairments:  Decreased range of motion, Postural dysfunction, Pain, Abnormal gait, Decreased activity tolerance, Decreased coordination, Decreased endurance, Decreased balance, Decreased  mobility, Difficulty walking, Hypomobility, Increased muscle spasms, Impaired perceived functional ability, Decreased strength, Impaired flexibility  Visit Diagnosis: Muscle weakness (  generalized)  Difficulty in walking, not elsewhere classified     Problem List Patient Active Problem List   Diagnosis Date Noted  . Candidal skin infection 04/29/2020  . Parkinson disease ([place]) 02/22/2020  . 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 Bipolar disorder, in full remission, most recent episode depressed ([place]) 11/08/2019  . Avitaminosis D 08/24/2019  . Insomnia due to mental condition 05/31/2019  . Bipolar I disorder, most recent episode depressed ([place]) 05/31/2019  . Mild cognitive disorder 05/31/2019  . Alcohol use disorder, moderate, in sustained remission ([place]) 05/31/2019  . Elevated tumor markers 05/27/2019  . GAD (generalized anxiety disorder) 04/10/2019  . Altered mental status 04/10/2019  . MDD (major depressive disorder), severe ([place]) 01/31/2019  . High serum carbohydrate antigen 19-9 (CA19-9) 12/01/2018  . Adjustment disorder with anxious mood 11/24/2018  . Essential hypertension 11/24/2018  . B12 deficiency 10/17/2018  . Incisional hernia, without obstruction or gangrene 09/12/2018  . Genetic testing 04/07/2018  . Family history of lung cancer 03/24/2018  . Malignant neoplasm of upper-outer quadrant of right breast in female, estrogen receptor positive ([place]) 03/17/2018  . Malignant neoplasm of upper-inner quadrant of right breast in female, estrogen receptor positive ([place]) 03/17/2018  . History of alcohol dependence ([place]) 02/07/2018  . History of psychosis 02/07/2018  . History of insomnia 02/07/2018    Blythe Stanford, PT DPT 05/21/2020, 2:58 PM  [facility] [address] [place], [address] Phone: [phone]   Fax:  [phone]  Name: Bruchy Mikel MRN: [id number] Date of Birth: Apr 24, 1946

## 2020-05-23 ENCOUNTER — Ambulatory Visit: Payer: Medicare PPO

## 2020-05-23 ENCOUNTER — Other Ambulatory Visit: Payer: Self-pay

## 2020-05-23 DIAGNOSIS — M6281 Muscle weakness (generalized): Secondary | ICD-10-CM

## 2020-05-23 DIAGNOSIS — R262 Difficulty in walking, not elsewhere classified: Secondary | ICD-10-CM | POA: Diagnosis not present

## 2020-05-23 DIAGNOSIS — M25522 Pain in left elbow: Secondary | ICD-10-CM | POA: Diagnosis not present

## 2020-05-23 DIAGNOSIS — M25622 Stiffness of left elbow, not elsewhere classified: Secondary | ICD-10-CM | POA: Diagnosis not present

## 2020-05-23 NOTE — Therapy (Signed)
[facility] [address] [place], [address] Phone: [phone]   Fax:  [phone]  Physical Therapy Treatment  Patient Details  Name: Kendra [name] MRN: [id number] Date of Birth: [birth year] No data recorded  Encounter Date: 05/23/2020   PT End of Session - 05/23/20 1521    Visit Number 46    Number of Visits 50    Date for PT Re-Evaluation 06/12/20    Authorization Type 6/10    Authorization Time Period until 06/12/2020    PT Start Time 1430    PT Stop Time 1515    PT Time Calculation (min) 45 min    Equipment Utilized During Treatment Gait belt    Activity Tolerance Patient tolerated treatment well;No increased pain    Behavior During Therapy WFL for tasks assessed/performed           Past Medical History:  Diagnosis Date  . Anxiety   . Breast cancer ([place]) 03/2018   right breast cancer at 11:00 and 1:00  . Bursitis    bilateral hips and knees  . Complication of anesthesia   . Depression   . Family history of breast cancer 03/24/2018  . Family history of lung cancer 03/24/2018  . History of alcohol dependence ([place]) 2007   no ETOH; resolved since 2007  . History of hiatal hernia   . History of psychosis 2014   due to sleep disturbance  . Hypertension   . Personal history of radiation therapy 06/2018-07/2018   right breast ca  . PONV (postoperative nausea and vomiting)     Past Surgical History:  Procedure Laterality Date  . BREAST BIOPSY Right 03/14/2018   11:00 DCIS and invasive ductal carcinoma  . BREAST BIOPSY Right 03/14/2018   1:00 Invasive ductal carcinoma  . BREAST LUMPECTOMY Right 04/27/2018   lumpectomy of 11 and 1:00 cancers, clear margins, negative LN  . BREAST LUMPECTOMY WITH RADIOACTIVE SEED AND SENTINEL LYMPH NODE BIOPSY Right 04/27/2018   Procedure: RIGHT BREAST LUMPECTOMY WITH RADIOACTIVE SEED X 2 AND RIGHT SENTINEL LYMPH NODE BIOPSY;  Surgeon: [name], MD;   Location: [facility];  Service: General;  Laterality: Right;  . CATARACT EXTRACTION W/PHACO Left 08/30/2019   Procedure: CATARACT EXTRACTION PHACO AND INTRAOCULAR LENS PLACEMENT ([place]) LEFT panoptix toric  01:20.5  12.7%  10.26;  Surgeon: Leandrew Koyanagi, MD;  Location: [facility];  Service: Ophthalmology;  Laterality: Left;  . CATARACT EXTRACTION W/PHACO Right 09/20/2019   Procedure: CATARACT EXTRACTION PHACO AND INTRAOCULAR LENS PLACEMENT ([facility]) RIGHT 5.71  00:36.4  15.7%;  Surgeon: Leandrew Koyanagi, MD;  Location: [facility];  Service: Ophthalmology;  Laterality: Right;  . LAPAROTOMY N/A 02/10/2018   Procedure: EXPLORATORY LAPAROTOMY;  Surgeon: Isabel Caprice, MD;  [facility];  Service: Gynecology;  Laterality: N/A;  . MASS EXCISION  01/2018   abdominal  . OVARIAN CYST REMOVAL    . SALPINGOOPHORECTOMY Bilateral 02/10/2018   Procedure: BILATERAL SALPINGO OOPHORECTOMY; PERITONEAL WASHINGS;  Surgeon: Isabel Caprice, MD;  [facility];  Service: Gynecology;  Laterality: Bilateral;  . TONSILLECTOMY    . TONSILLECTOMY AND ADENOIDECTOMY  1953    There were no vitals filed for this visit.   Subjective Assessment - 05/23/20 1432    Subjective Patient states she continues to experience dizziness.  She worked on her HEP before PT today.    Pertinent History Patient report unclear regarding onset. Visited MD on Sept 16 and received injection  with relief for approximately 1 month. Reports she cannot receive an additional injection pending eye surgery. Received dx from MD of hip bursitis in B hips. Reports pain with walking, stanindg, sitting 5/10 current 7/10 worst 3/10 best, with temporary relief when she is sitting "just right" and with naproxen. Agg: activity. Cormorbidities: hx of breast cancer, HTN, MCI, insomnia, cataracts    Patient Stated Goals Be able to move without hurting, walk 1 hour           Treatment Today:   Therapeutic  Exercise Reaching out, down, up and back in sitting to break small movement patterns secondary to Parkinsons -2 x 10 Step forward clap overhead, step backward clap behind to break small movement patterns secondary to Parkinsons -2 x 10 B Sit to stand x5, 3 sets with overhead reach when standing Ambulation with head turns up/down, left/right - 2 x 28ft each direction VOR (PT CGA Feet together on airex pad - no UE support 3x30 sec Feet together on airex pad with head turns - 2 x 10 gaze fixation R/L and up/down Tandem Stance (modified tandem with space between feet) ball toss at various heights and sides with PT- 2x10 R and L (PT Min A for regain balance occasionally, PT verbal cues to keep R foot flat on floor as pt's R foot moved into supination during this activity)   Performed exercises to address vestibular and to disrupt small movements         PT Education - 05/23/20 1520    Education provided Yes    Education Details exercise technique/form    Person(s) Educated Patient    Comprehension Verbalized understanding;Returned demonstration            PT Short Term Goals - 09/12/19 1419      PT SHORT TERM GOAL #1   Title Patient will demonstrate complaince with HEP to speed recovery and reduce total number of visits.    Baseline HEP given    Time 2    Period Weeks    Status New    Target Date 09/27/19             PT Long Term Goals - 05/14/20 1407      PT LONG TERM GOAL #1   Title Patient will improve hip strength to 4/5 grossly to facilitate improved mobility with ADLs.    Baseline 3, 4-; 12/22 deferred to NV; 11/09/2019: 4-/5; 12/07/2019 L hip flexor 4, abd and ext 3+; 01/29/2020: 4/5 for most hip motions, 3+/5 for abd, ext; 03/20/2020: 4/5 for hip movements    Time 8    Period Weeks    Status Achieved      PT LONG TERM GOAL #2   Title Patient will demonstrate walking tolerance increased to 30 minutes without increase in symptoms to demonstrate improved ability to  perform walking program for cardiovascular health.    Baseline 10 minute tolerance (patient report); 12/22 10 minutes (per pt report); 11/09/2019: 7 min; 12/07/2019 unable to assess due to icy streets;  01/29/2020: 10 min; 03/20/2020 10 min; 05/14/2020: 10 min x 3 per day    Time 8    Period Weeks    Status On-going      PT LONG TERM GOAL #3   Title Patient will report worst pain of 3/10 during therapy to demonstrate reduced disability    Baseline Worst 7/10; 12/22 worst 9/10; 11/09/2019: 9/10; 12/07/2019 7/10; 01/29/2020: 7/10; 05/14/2020: 0/ 10    Time 8  Period Weeks    Status Achieved      PT LONG TERM GOAL #4   Title Patient will demonstrate ability to perform all bed mobility maneuvers without increase in pain for reduced disability with mobility and transfers with ADLs.    Baseline Reports pain with bed mobility supine <> sidelying L/R, sidelying to sitting EOB; 12/22 pt able to perform I without increased pain    Time 8    Period Weeks    Status Achieved      PT LONG TERM GOAL #5   Title Patient will improve 10MW speed to 1.0 m/sec to meet norms for minimal fall risk and full community ambulation.    Baseline 0.88 m/sec; 12/22 0.96 m/sec; 11/09/2019: deferred; 12/07/2019 0.90 m/sec; 01/29/2020: .9  m/sec; 03/20/2020: .76m/s; 05/14/2020: . 9 m/s    Time 8    Period Weeks    Status On-going      Additional Long Term Goals   Additional Long Term Goals Yes      PT LONG TERM GOAL #6   Title Patient will increase 6MWT by 200 ft to demonstrate improvement in functional capacity for transition to cardiac walking program    Baseline 6MWT = 1195; 1/7/20201: deferred; 12/07/2019 1095; 01/29/2020: deferred secondary to fatigue; 03/20/2020: 1086ft    Time 8    Period Weeks    Status Deferred      PT LONG TERM GOAL #7   Title Patient will improve her 5xsts to under 15 sec to indicate singificnat improvement in LE functional strength and decrease in fall risk.    Baseline 24.5 sec; 03/20/2020: 19.19 sec     Time 6    Period Weeks    Status On-going      PT LONG TERM GOAL #8   Title Patient will improve FGA to 28/30 to indicate improvement in dynamic balance and improvement in stability.    Baseline 23/30    Time 6    Period Weeks    Status New                 Plan - 05/23/20 1522    Clinical Impression Statement Pt was most challenged with dynamic balance/dual task balance activities today; she had occasional LOB with tandem stance ball toss/throw and required min A for regaining balance.  Overall she was challenged to fatigue by the end of session today.  She should cont to benefit from skilled PT to address impairements related to balance/dizziness to reduce fall risk.    Personal Factors and Comorbidities Age;Comorbidity 3+;Time since onset of injury/illness/exacerbation;Comorbidity 2    Comorbidities MCI, HTN, CA, cataracts    Examination-Activity Limitations Bend;Sit;Squat;Stairs;Stand;Sleep    Examination-Participation Restrictions Cleaning;Community Activity    Stability/Clinical Decision Making Evolving/Moderate complexity    Rehab Potential Fair    Clinical Impairments Affecting Rehab Potential MCI    PT Frequency 2x / week    PT Duration 6 weeks    PT Treatment/Interventions ADLs/Self Care Home Management;Therapeutic exercise;Patient/family education;Neuromuscular re-education;Therapeutic activities;Functional mobility training;Balance training;Manual techniques;Gait training;Stair training;Moist Heat;Electrical Stimulation;Cryotherapy;Taping;Dry needling;Energy conservation;Passive range of motion;Joint Manipulations    PT Next Visit Plan Progress glute therex, pelvic-hip dissociation; check and progress HEP with stretches    PT Home Exercise Plan lateral step ups, step ups, hip kickers ext/abd red theraband    Consulted and Agree with Plan of Care Patient           Patient will benefit from skilled therapeutic intervention in order to improve the following deficits  and impairments:  Decreased range of motion, Postural dysfunction, Pain, Abnormal gait, Decreased activity tolerance, Decreased coordination, Decreased endurance, Decreased balance, Decreased mobility, Difficulty walking, Hypomobility, Increased muscle spasms, Impaired perceived functional ability, Decreased strength, Impaired flexibility  Visit Diagnosis: Muscle weakness (generalized)  Difficulty in walking, not elsewhere classified     Problem List Patient Active Problem List   Diagnosis Date Noted  . Candidal skin infection 04/29/2020  . Parkinson disease ([place]) 02/22/2020  . 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 Bipolar disorder, in full remission, most recent episode depressed ([place]) 11/08/2019  . Avitaminosis D 08/24/2019  . Insomnia due to mental condition 05/31/2019  . Bipolar I disorder, most recent episode depressed ([place]) 05/31/2019  . Mild cognitive disorder 05/31/2019  . Alcohol use disorder, moderate, in sustained remission ([place]) 05/31/2019  . Elevated tumor markers 05/27/2019  . GAD (generalized anxiety disorder) 04/10/2019  . Altered mental status 04/10/2019  . MDD (major depressive disorder), severe ([place]) 01/31/2019  . High serum carbohydrate antigen 19-9 (CA19-9) 12/01/2018  . Adjustment disorder with anxious mood 11/24/2018  . Essential hypertension 11/24/2018  . B12 deficiency 10/17/2018  . Incisional hernia, without obstruction or gangrene 09/12/2018  . Genetic testing 04/07/2018  . Family history of lung cancer 03/24/2018  . Malignant neoplasm of upper-outer quadrant of right breast in female, estrogen receptor positive ([place]) 03/17/2018  . Malignant neoplasm of upper-inner quadrant of right breast in female, estrogen receptor positive ([place]) 03/17/2018  . History of alcohol dependence ([place]) 02/07/2018  . History of psychosis 02/07/2018  . History of insomnia 02/07/2018    Pincus Badder 05/23/2020, 3:33 PM Merdis Delay, PT, DPT Physical Therapist - [facility] [address], [place], [address] Phone: [phone]   Fax:  [phone]  Name: Martesha Niedermeier MRN: [id number] Date of Birth: 25-Feb-1946

## 2020-05-28 ENCOUNTER — Ambulatory Visit: Payer: Medicare PPO | Admitting: Occupational Therapy

## 2020-05-28 ENCOUNTER — Other Ambulatory Visit: Payer: Self-pay

## 2020-05-28 ENCOUNTER — Ambulatory Visit: Payer: Medicare PPO

## 2020-05-28 DIAGNOSIS — M6281 Muscle weakness (generalized): Secondary | ICD-10-CM

## 2020-05-28 DIAGNOSIS — R262 Difficulty in walking, not elsewhere classified: Secondary | ICD-10-CM | POA: Diagnosis not present

## 2020-05-28 DIAGNOSIS — M25622 Stiffness of left elbow, not elsewhere classified: Secondary | ICD-10-CM

## 2020-05-28 DIAGNOSIS — M25522 Pain in left elbow: Secondary | ICD-10-CM

## 2020-05-28 NOTE — Therapy (Signed)
[facility] [address], [place], [address] Phone: [phone]   Fax:  [phone]  Occupational Therapy Treatment  Patient Details  Name: Kendra [name] MRN: [id number] Date of Birth: [birth year] Referring Provider (OT): Vance Peper Date: 05/28/2020   OT End of Session - 05/28/20 1356    Visit Number 12    Number of Visits 14    Date for OT Re-Evaluation 05/31/20    OT Start Time 1300    OT Stop Time 1344    OT Time Calculation (min) 44 min    Activity Tolerance Patient tolerated treatment well    Behavior During Therapy [facility] for tasks assessed/performed           Past Medical History:  Diagnosis Date  . Anxiety   . Breast cancer ([place]) 03/2018   right breast cancer at 11:00 and 1:00  . Bursitis    bilateral hips and knees  . Complication of anesthesia   . Depression   . Family history of breast cancer 03/24/2018  . Family history of lung cancer 03/24/2018  . History of alcohol dependence ([place]) 2007   no ETOH; resolved since 2007  . History of hiatal hernia   . History of psychosis 2014   due to sleep disturbance  . Hypertension   . Personal history of radiation therapy 06/2018-07/2018   right breast ca  . PONV (postoperative nausea and vomiting)     Past Surgical History:  Procedure Laterality Date  . BREAST BIOPSY Right 03/14/2018   11:00 DCIS and invasive ductal carcinoma  . BREAST BIOPSY Right 03/14/2018   1:00 Invasive ductal carcinoma  . BREAST LUMPECTOMY Right 04/27/2018   lumpectomy of 11 and 1:00 cancers, clear margins, negative LN  . BREAST LUMPECTOMY WITH RADIOACTIVE SEED AND SENTINEL LYMPH NODE BIOPSY Right 04/27/2018   Procedure: RIGHT BREAST LUMPECTOMY WITH RADIOACTIVE SEED X 2 AND RIGHT SENTINEL LYMPH NODE BIOPSY;  Surgeon: [name], MD;  Location: [facility];  Service: General;  Laterality: Right;  . CATARACT EXTRACTION W/PHACO Left  08/30/2019   Procedure: CATARACT EXTRACTION PHACO AND INTRAOCULAR LENS PLACEMENT ([place]) LEFT panoptix toric  01:20.5  12.7%  10.26;  Surgeon: Leandrew Koyanagi, MD;  Location: [facility];  Service: Ophthalmology;  Laterality: Left;  . CATARACT EXTRACTION W/PHACO Right 09/20/2019   Procedure: CATARACT EXTRACTION PHACO AND INTRAOCULAR LENS PLACEMENT ([facility]) RIGHT 5.71  00:36.4  15.7%;  Surgeon: Leandrew Koyanagi, MD;  Location: [facility];  Service: Ophthalmology;  Laterality: Right;  . LAPAROTOMY N/A 02/10/2018   Procedure: EXPLORATORY LAPAROTOMY;  Surgeon: Isabel Caprice, MD;  [facility];  Service: Gynecology;  Laterality: N/A;  . MASS EXCISION  01/2018   abdominal  . OVARIAN CYST REMOVAL    . SALPINGOOPHORECTOMY Bilateral 02/10/2018   Procedure: BILATERAL SALPINGO OOPHORECTOMY; PERITONEAL WASHINGS;  Surgeon: Isabel Caprice, MD;  [facility];  Service: Gynecology;  Laterality: Bilateral;  . TONSILLECTOMY    . TONSILLECTOMY AND ADENOIDECTOMY  1953    There were no vitals filed for this visit.   Subjective Assessment - 05/28/20 1353    Subjective  I am doing my exercises - done the putty - but cannot do to many - then it hurts my thumb - I can do most everything - except vacuum and putting fitting sheet on - but prior to my fracture had issues with that - matress is heavy    Pertinent History Pt  fell 11/23/2019 and fracture her R elbow - minimal displaced radius head fx - pt in sling if she is out and about - but refer to OT for ROM ,PROM and weight limited 1-2 lbs -    Patient Stated Goals I would like the ROM , strength better so I can push up from chair and have no pain    Currently in Pain? No/denies               L AROM for elbow compare to R - same for flexion - extention Strength flexion andextension4+/5-with end range weaker for elbow extention and over head pressing up     Kept at same  dark blue mix with light blue  3sets12  toget fingers into putty - grip about 4 sec into putty and can do R and L   Cont withWall pushupdoing well with per pt - 3 sets of 10-12pain free Pt to keep hands on shoulder height   able to lift and carry8# and 9#  without any compensation at the UE.-  For reaching shoulder height and drop down to side and back on desk  10 lbs same but only 30 sec  5 lbs lift over head but not able to do repetively     Can cont doing same HEP at home - until get into [facility]  GTB for elbow extention and flexion - bilateral 12 reps  2 sets  GTB D1 and D2 patterns add for shoulder - bilateral 10 reps  No pain or issues can increase in 5-7 days to 2 sets and another 5 days 3 sets  1 x day  Cont with GTB for scapula squeezes 2 sets   And punches with GTB - 2 sets of 12   Done with pt and took some video for pt some execises to do at gym:  2 lbs for over head presses - 10 reps 2 lbs for punches up  L and R overhead 3 lbs for elbow curls  3lbs for tricep kickbacks-10reps   On BIODEX - if at gym Can do rowing 15 lbs - 10 reps  Chest press - 10 lbs 10 reps Elbow extention presses - 10 lbs - 10 resp Pt to look into what other equipment there are -and to review next session  = for pt to transition to gym again - as prior to elbow fracture                    OT Education - 05/28/20 1354    Education Details discuss progress and transitioning to gym    Person(s) Educated Patient    Methods Explanation;Demonstration;Tactile cues;Verbal cues;Handout    Comprehension Verbal cues required;Returned demonstration;Verbalized understanding            OT Short Term Goals - 02/21/20 1619      OT SHORT TERM GOAL #1   Title Pt to be independent in HEP to increase L elbow extention to -5 and increase strenght to carry more than 5 lbs    Baseline elbow extention AROM -5 and PROM and in session 0 - carry and lift 5 lbs with ease    Status Achieved             OT Long Term Goals  - 05/28/20 1400      OT LONG TERM GOAL #1   Title L Elbow extention improve for pt to pull shirt over head, push up and carry more than 8 lbs  without increase symptoms    Baseline Pt can carry and lift to elbow height 9 lbs - hold 10 lbs 30 sec - over head can do 5 lbs but not repetively -    Status Achieved      OT LONG TERM GOAL #2   Title Pt AROM and strength in L elbow improve to prior level for function    Status Achieved      OT LONG TERM GOAL #3   Title Pt to show knowledge on exercises she can do at the [facility] to increase or condition UB and cardio    Baseline no knowledge - will look into joining again and going few times prior to next appt in 2 wks    Time 4    Period Weeks    Status New    Target Date 06/25/20                 Plan - 05/28/20 1356    Clinical Impression Statement Pt is about 6 months out from L elbow fx - pt also since then had Parkinson's diagnosis - AROM for L elbow WNL - can increase strength - can carry and lift to elbow height 9 -10 lbs - but over head 5 l bs but not repetitively - discuss with pt that able to use L arm in all ADL's and IADL's and can transition to gym - pt report she did go prior to fall to [facility] - pt was provided some exercises to do at gym and look into what machine they have - will follow up in 2 wks    OT Occupational Profile and History Problem Focused Assessment - Including review of records relating to presenting problem    Occupational performance deficits (Please refer to evaluation for details): ADL's;IADL's;Play;Leisure;Social Participation    Body Structure / Function / Physical Skills ADL;Flexibility;ROM;UE functional use;Pain;Strength;IADL    Rehab Potential Good    Clinical Decision Making Limited treatment options, no task modification necessary    Comorbidities Affecting Occupational Performance: May have comorbidities impacting occupational performance    Modification or Assistance to Complete Evaluation  No  modification of tasks or assist necessary to complete eval    OT Frequency Biweekly    OT Duration 2 weeks    OT Treatment/Interventions Self-care/ADL training;Therapeutic exercise;Patient/family education;Cryotherapy;Paraffin;Moist Heat;Manual Therapy;Passive range of motion;Therapeutic activities    Plan discuss if back to [facility] and  how she did at gym -and changes    OT Home Exercise Plan see pt instruction    Consulted and Agree with Plan of Care Patient           Patient will benefit from skilled therapeutic intervention in order to improve the following deficits and impairments:   Body Structure / Function / Physical Skills: ADL, Flexibility, ROM, UE functional use, Pain, Strength, IADL       Visit Diagnosis: Muscle weakness (generalized)  Stiffness of left elbow, not elsewhere classified  Pain in left elbow    Problem List Patient Active Problem List   Diagnosis Date Noted  . Candidal skin infection 04/29/2020  . Parkinson disease ([place]) 02/22/2020  . 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 Bipolar disorder, in full remission, most recent episode depressed ([place]) 11/08/2019  . Avitaminosis D 08/24/2019  . Insomnia due to mental condition 05/31/2019  . Bipolar I disorder, most recent episode depressed ([place]) 05/31/2019  . Mild cognitive disorder 05/31/2019  . Alcohol use disorder, moderate, in sustained remission ([place]) 05/31/2019  . Elevated tumor  markers 05/27/2019  . GAD (generalized anxiety disorder) 04/10/2019  . Altered mental status 04/10/2019  . MDD (major depressive disorder), severe ([place]) 01/31/2019  . High serum carbohydrate antigen 19-9 (CA19-9) 12/01/2018  . Adjustment disorder with anxious mood 11/24/2018  . Essential hypertension 11/24/2018  . B12 deficiency 10/17/2018  . Incisional hernia, without obstruction or gangrene 09/12/2018  . Genetic testing 04/07/2018  . Family history of lung cancer 03/24/2018  . Malignant neoplasm of upper-outer quadrant of  right breast in female, estrogen receptor positive ([place]) 03/17/2018  . Malignant neoplasm of upper-inner quadrant of right breast in female, estrogen receptor positive ([place]) 03/17/2018  . History of alcohol dependence ([place]) 02/07/2018  . History of psychosis 02/07/2018  . History of insomnia 02/07/2018    Rosalyn Gess OTR/L,CLT 05/28/2020, 2:04 PM  [facility] [address], [place], [address] Phone: [phone]   Fax:  [phone]  Name: Kendra [name] MRN: [id number] Date of Birth: 13-May-1946

## 2020-05-28 NOTE — Patient Instructions (Signed)
See note

## 2020-05-28 NOTE — Therapy (Signed)
[facility] [address], [place], [address] Phone: [phone]   Fax:  [phone]  Physical Therapy Treatment  Patient Details  Name: Kendra [name] MRN: [id number] Date of Birth: [birth year] No data recorded  Encounter Date: 05/28/2020   PT End of Session - 05/28/20 1353    Visit Number 47    Number of Visits 50    Date for PT Re-Evaluation 06/12/20    Authorization Type 7/10    Authorization Time Period until 06/12/2020    PT Start Time 1345    PT Stop Time 1430    PT Time Calculation (min) 45 min    Equipment Utilized During Treatment Gait belt    Activity Tolerance Patient tolerated treatment well;No increased pain    Behavior During Therapy WFL for tasks assessed/performed           Past Medical History:  Diagnosis Date  . Anxiety   . Breast cancer ([place]) 03/2018   right breast cancer at 11:00 and 1:00  . Bursitis    bilateral hips and knees  . Complication of anesthesia   . Depression   . Family history of breast cancer 03/24/2018  . Family history of lung cancer 03/24/2018  . History of alcohol dependence ([place]) 2007   no ETOH; resolved since 2007  . History of hiatal hernia   . History of psychosis 2014   due to sleep disturbance  . Hypertension   . Personal history of radiation therapy 06/2018-07/2018   right breast ca  . PONV (postoperative nausea and vomiting)     Past Surgical History:  Procedure Laterality Date  . BREAST BIOPSY Right 03/14/2018   11:00 DCIS and invasive ductal carcinoma  . BREAST BIOPSY Right 03/14/2018   1:00 Invasive ductal carcinoma  . BREAST LUMPECTOMY Right 04/27/2018   lumpectomy of 11 and 1:00 cancers, clear margins, negative LN  . BREAST LUMPECTOMY WITH RADIOACTIVE SEED AND SENTINEL LYMPH NODE BIOPSY Right 04/27/2018   Procedure: RIGHT BREAST LUMPECTOMY WITH RADIOACTIVE SEED X 2 AND RIGHT SENTINEL LYMPH NODE BIOPSY;  Surgeon: [name], MD;   Location: [facility];  Service: General;  Laterality: Right;  . CATARACT EXTRACTION W/PHACO Left 08/30/2019   Procedure: CATARACT EXTRACTION PHACO AND INTRAOCULAR LENS PLACEMENT ([place]) LEFT panoptix toric  01:20.5  12.7%  10.26;  Surgeon: Leandrew Koyanagi, MD;  Location: [facility];  Service: Ophthalmology;  Laterality: Left;  . CATARACT EXTRACTION W/PHACO Right 09/20/2019   Procedure: CATARACT EXTRACTION PHACO AND INTRAOCULAR LENS PLACEMENT ([facility]) RIGHT 5.71  00:36.4  15.7%;  Surgeon: Leandrew Koyanagi, MD;  Location: [facility];  Service: Ophthalmology;  Laterality: Right;  . LAPAROTOMY N/A 02/10/2018   Procedure: EXPLORATORY LAPAROTOMY;  Surgeon: Isabel Caprice, MD;  [facility];  Service: Gynecology;  Laterality: N/A;  . MASS EXCISION  01/2018   abdominal  . OVARIAN CYST REMOVAL    . SALPINGOOPHORECTOMY Bilateral 02/10/2018   Procedure: BILATERAL SALPINGO OOPHORECTOMY; PERITONEAL WASHINGS;  Surgeon: Isabel Caprice, MD;  [facility];  Service: Gynecology;  Laterality: Bilateral;  . TONSILLECTOMY    . TONSILLECTOMY AND ADENOIDECTOMY  1953    There were no vitals filed for this visit.   Subjective Assessment - 05/28/20 1350    Subjective Patient states no major changes since the previous session, states she continues to have dizziness and would like to continue addressing these difficulties.    Pertinent History Patient report unclear regarding onset. Visited  MD on Sept 16 and received injection with relief for approximately 1 month. Reports she cannot receive an additional injection pending eye surgery. Received dx from MD of hip bursitis in B hips. Reports pain with walking, stanindg, sitting 5/10 current 7/10 worst 3/10 best, with temporary relief when she is sitting "just right" and with naproxen. Agg: activity. Cormorbidities: hx of breast cancer, HTN, MCI, insomnia, cataracts    Patient Stated Goals Be able to move without hurting, walk 1  hour    Currently in Pain? No/denies                 TREATMENT Therapeutic Exercise Reaching out, down, up and back in sitting to break small movement patterns secondary to Parkinsons - x 10 Step forward clap overhead, step backward clap behind to break small movement patterns secondary to Parkinsons - x 10 B Wide steps stance reaching with the arms - x 15 opposite arm, opposite leg  Feet together airex pad with head turns up/down, left/right - x 20 B VOR Marches on airex pad - 2 x 10  Ambulation    Performed exercises to address vestibular and to disrupt small movements      PT Short Term Goals - 09/12/19 1419      PT SHORT TERM GOAL #1   Title Patient will demonstrate complaince with HEP to speed recovery and reduce total number of visits.    Baseline HEP given    Time 2    Period Weeks    Status New    Target Date 09/27/19             PT Long Term Goals - 05/14/20 1407      PT LONG TERM GOAL #1   Title Patient will improve hip strength to 4/5 grossly to facilitate improved mobility with ADLs.    Baseline 3, 4-; 12/22 deferred to NV; 11/09/2019: 4-/5; 12/07/2019 L hip flexor 4, abd and ext 3+; 01/29/2020: 4/5 for most hip motions, 3+/5 for abd, ext; 03/20/2020: 4/5 for hip movements    Time 8    Period Weeks    Status Achieved      PT LONG TERM GOAL #2   Title Patient will demonstrate walking tolerance increased to 30 minutes without increase in symptoms to demonstrate improved ability to perform walking program for cardiovascular health.    Baseline 10 minute tolerance (patient report); 12/22 10 minutes (per pt report); 11/09/2019: 7 min; 12/07/2019 unable to assess due to icy streets;  01/29/2020: 10 min; 03/20/2020 10 min; 05/14/2020: 10 min x 3 per day    Time 8    Period Weeks    Status On-going      PT LONG TERM GOAL #3   Title Patient will report worst pain of 3/10 during therapy to demonstrate reduced disability    Baseline Worst 7/10; 12/22 worst 9/10; 11/09/2019:  9/10; 12/07/2019 7/10; 01/29/2020: 7/10; 05/14/2020: 0/ 10    Time 8    Period Weeks    Status Achieved      PT LONG TERM GOAL #4   Title Patient will demonstrate ability to perform all bed mobility maneuvers without increase in pain for reduced disability with mobility and transfers with ADLs.    Baseline Reports pain with bed mobility supine <> sidelying L/R, sidelying to sitting EOB; 12/22 pt able to perform I without increased pain    Time 8    Period Weeks    Status Achieved      PT  LONG TERM GOAL #5   Title Patient will improve 10MW speed to 1.0 m/sec to meet norms for minimal fall risk and full community ambulation.    Baseline 0.88 m/sec; 12/22 0.96 m/sec; 11/09/2019: deferred; 12/07/2019 0.90 m/sec; 01/29/2020: .9  m/sec; 03/20/2020: .57m/s; 05/14/2020: . 9 m/s    Time 8    Period Weeks    Status On-going      Additional Long Term Goals   Additional Long Term Goals Yes      PT LONG TERM GOAL #6   Title Patient will increase 6MWT by 200 ft to demonstrate improvement in functional capacity for transition to cardiac walking program    Baseline 6MWT = 1195; 1/7/20201: deferred; 12/07/2019 1095; 01/29/2020: deferred secondary to fatigue; 03/20/2020: 1045ft    Time 8    Period Weeks    Status Deferred      PT LONG TERM GOAL #7   Title Patient will improve her 5xsts to under 15 sec to indicate singificnat improvement in LE functional strength and decrease in fall risk.    Baseline 24.5 sec; 03/20/2020: 19.19 sec    Time 6    Period Weeks    Status On-going      PT LONG TERM GOAL #8   Title Patient will improve FGA to 28/30 to indicate improvement in dynamic balance and improvement in stability.    Baseline 23/30    Time 6    Period Weeks    Status New                 Plan - 05/28/20 1416    Clinical Impression Statement Continued to challenge vestibular system throughout the session today. Less LOB and verbal cueing required for exercises performed during today's visit. Although  patient is improving, she had difficulty with ambulation while looking at busy backgrounds, indicating limitatations in the vestibular-ocular system. Patient will benefit from further skilled therapy to return to prior level of function.    Personal Factors and Comorbidities Age;Comorbidity 3+;Time since onset of injury/illness/exacerbation;Comorbidity 2    Comorbidities MCI, HTN, CA, cataracts    Examination-Activity Limitations Bend;Sit;Squat;Stairs;Stand;Sleep    Examination-Participation Restrictions Cleaning;Community Activity    Stability/Clinical Decision Making Evolving/Moderate complexity    Rehab Potential Fair    Clinical Impairments Affecting Rehab Potential MCI    PT Frequency 2x / week    PT Duration 6 weeks    PT Treatment/Interventions ADLs/Self Care Home Management;Therapeutic exercise;Patient/family education;Neuromuscular re-education;Therapeutic activities;Functional mobility training;Balance training;Manual techniques;Gait training;Stair training;Moist Heat;Electrical Stimulation;Cryotherapy;Taping;Dry needling;Energy conservation;Passive range of motion;Joint Manipulations    PT Next Visit Plan Progress glute therex, pelvic-hip dissociation; check and progress HEP with stretches    PT Home Exercise Plan lateral step ups, step ups, hip kickers ext/abd red theraband    Consulted and Agree with Plan of Care Patient           Patient will benefit from skilled therapeutic intervention in order to improve the following deficits and impairments:  Decreased range of motion, Postural dysfunction, Pain, Abnormal gait, Decreased activity tolerance, Decreased coordination, Decreased endurance, Decreased balance, Decreased mobility, Difficulty walking, Hypomobility, Increased muscle spasms, Impaired perceived functional ability, Decreased strength, Impaired flexibility  Visit Diagnosis: Muscle weakness (generalized)  Difficulty in walking, not elsewhere classified     Problem  List Patient Active Problem List   Diagnosis Date Noted  . Candidal skin infection 04/29/2020  . Parkinson disease ([place]) 02/22/2020  . 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 Bipolar disorder, in full remission, most recent episode depressed (  [place]) 11/08/2019  . Avitaminosis D 08/24/2019  . Insomnia due to mental condition 05/31/2019  . Bipolar I disorder, most recent episode depressed ([place]) 05/31/2019  . Mild cognitive disorder 05/31/2019  . Alcohol use disorder, moderate, in sustained remission ([place]) 05/31/2019  . Elevated tumor markers 05/27/2019  . GAD (generalized anxiety disorder) 04/10/2019  . Altered mental status 04/10/2019  . MDD (major depressive disorder), severe ([place]) 01/31/2019  . High serum carbohydrate antigen 19-9 (CA19-9) 12/01/2018  . Adjustment disorder with anxious mood 11/24/2018  . Essential hypertension 11/24/2018  . B12 deficiency 10/17/2018  . Incisional hernia, without obstruction or gangrene 09/12/2018  . Genetic testing 04/07/2018  . Family history of lung cancer 03/24/2018  . Malignant neoplasm of upper-outer quadrant of right breast in female, estrogen receptor positive ([place]) 03/17/2018  . Malignant neoplasm of upper-inner quadrant of right breast in female, estrogen receptor positive ([place]) 03/17/2018  . History of alcohol dependence ([place]) 02/07/2018  . History of psychosis 02/07/2018  . History of insomnia 02/07/2018    Blythe Stanford, PT DPT 05/28/2020, 2:34 PM  [facility] [address] [place], [address] Phone: [phone]   Fax:  [phone]  Name: Kendra [name] MRN: [id number] Date of Birth: [birth year]

## 2020-05-30 ENCOUNTER — Other Ambulatory Visit: Payer: Self-pay

## 2020-05-30 ENCOUNTER — Ambulatory Visit: Payer: Medicare PPO

## 2020-05-30 DIAGNOSIS — R262 Difficulty in walking, not elsewhere classified: Secondary | ICD-10-CM | POA: Diagnosis not present

## 2020-05-30 DIAGNOSIS — M25522 Pain in left elbow: Secondary | ICD-10-CM | POA: Diagnosis not present

## 2020-05-30 DIAGNOSIS — M25622 Stiffness of left elbow, not elsewhere classified: Secondary | ICD-10-CM | POA: Diagnosis not present

## 2020-05-30 DIAGNOSIS — M6281 Muscle weakness (generalized): Secondary | ICD-10-CM | POA: Diagnosis not present

## 2020-05-30 NOTE — Therapy (Signed)
[facility] [address] [place], [address] Phone: [phone]   Fax:  [phone]  Physical Therapy Treatment  Patient Details  Name: Kendra [name] MRN: [id number] Date of Birth: [birth year] No data recorded  Encounter Date: 05/30/2020   PT End of Session - 05/30/20 1525    Visit Number 48    Number of Visits 50    Date for PT Re-Evaluation 06/12/20    Authorization Type 8/10    Authorization Time Period until 06/12/2020    PT Start Time 1515    PT Stop Time 1600    PT Time Calculation (min) 45 min    Equipment Utilized During Treatment Gait belt    Activity Tolerance Patient tolerated treatment well;No increased pain    Behavior During Therapy [facility] for tasks assessed/performed           Past Medical History:  Diagnosis Date   Anxiety    Breast cancer ([place]) 03/2018   right breast cancer at 11:00 and 1:00   Bursitis    bilateral hips and knees   Complication of anesthesia    Depression    Family history of breast cancer 03/24/2018   Family history of lung cancer 03/24/2018   History of alcohol dependence ([place]) 2007   no ETOH; resolved since 2007   History of hiatal hernia    History of psychosis 2014   due to sleep disturbance   Hypertension    Personal history of radiation therapy 06/2018-07/2018   right breast ca   PONV (postoperative nausea and vomiting)     Past Surgical History:  Procedure Laterality Date   BREAST BIOPSY Right 03/14/2018   11:00 DCIS and invasive ductal carcinoma   BREAST BIOPSY Right 03/14/2018   1:00 Invasive ductal carcinoma   BREAST LUMPECTOMY Right 04/27/2018   lumpectomy of 11 and 1:00 cancers, clear margins, negative LN   BREAST LUMPECTOMY WITH RADIOACTIVE SEED AND SENTINEL LYMPH NODE BIOPSY Right 04/27/2018   Procedure: RIGHT BREAST LUMPECTOMY WITH RADIOACTIVE SEED X 2 AND RIGHT SENTINEL LYMPH NODE BIOPSY;  Surgeon: [name], MD;   Location: [facility];  Service: General;  Laterality: Right;   CATARACT EXTRACTION W/PHACO Left 08/30/2019   Procedure: CATARACT EXTRACTION PHACO AND INTRAOCULAR LENS PLACEMENT ([place]) LEFT panoptix toric  01:20.5  12.7%  10.26;  Surgeon: Leandrew Koyanagi, MD;  Location: [facility];  Service: Ophthalmology;  Laterality: Left;   CATARACT EXTRACTION W/PHACO Right 09/20/2019   Procedure: CATARACT EXTRACTION PHACO AND INTRAOCULAR LENS PLACEMENT ([facility]) RIGHT 5.71  00:36.4  15.7%;  Surgeon: Leandrew Koyanagi, MD;  Location: [facility];  Service: Ophthalmology;  Laterality: Right;   LAPAROTOMY N/A 02/10/2018   Procedure: EXPLORATORY LAPAROTOMY;  Surgeon: Isabel Caprice, MD;  [facility];  Service: Gynecology;  Laterality: N/A;   MASS EXCISION  01/2018   abdominal   OVARIAN CYST REMOVAL     SALPINGOOPHORECTOMY Bilateral 02/10/2018   Procedure: BILATERAL SALPINGO OOPHORECTOMY; PERITONEAL WASHINGS;  Surgeon: Isabel Caprice, MD;  [facility];  Service: Gynecology;  Laterality: Bilateral;   TONSILLECTOMY     TONSILLECTOMY AND ADENOIDECTOMY  1953    There were no vitals filed for this visit.   Subjective Assessment - 05/30/20 1521    Subjective Patient states she was shopping earlier today, no major change in symptoms noted otherwise.    Pertinent History Patient report unclear regarding onset. Visited MD on Sept 16 and received injection with relief  for approximately 1 month. Reports she cannot receive an additional injection pending eye surgery. Received dx from MD of hip bursitis in B hips. Reports pain with walking, stanindg, sitting 5/10 current 7/10 worst 3/10 best, with temporary relief when she is sitting "just right" and with naproxen. Agg: activity. Cormorbidities: hx of breast cancer, HTN, MCI, insomnia, cataracts    Patient Stated Goals Be able to move without hurting, walk 1 hour    Currently in Pain? No/denies                    TREATMENT Therapeutic Exercise Reaching out, down, up and back in sitting to break small movement patterns secondary to Parkinsons - x 15 Step forward clap overhead, step backward clap behind to break small movement patterns secondary to Parkinsons - x 15 B Wide steps stance reaching with the arms - x 20 opposite arm, opposite leg  Step outs laterally UE raised contralaterally - x 20  Side stepping on airex beam - 2x 3 38ft   Tandem ambulation on airex beam - 2x 5 96ft  Ambulation with head circles cw - 4 x 65ft Tandem stance amb head turns up/down -4 x 82ft   Performed exercises to address vestibular and to disrupt small movements      PT Education - 05/30/20 1524    Education provided Yes    Education Details form/technique with exercise    Person(s) Educated Patient    Methods Explanation;Demonstration    Comprehension Verbalized understanding;Returned demonstration            PT Short Term Goals - 09/12/19 1419      PT SHORT TERM GOAL #1   Title Patient will demonstrate complaince with HEP to speed recovery and reduce total number of visits.    Baseline HEP given    Time 2    Period Weeks    Status New    Target Date 09/27/19             PT Long Term Goals - 05/14/20 1407      PT LONG TERM GOAL #1   Title Patient will improve hip strength to 4/5 grossly to facilitate improved mobility with ADLs.    Baseline 3, 4-; 12/22 deferred to NV; 11/09/2019: 4-/5; 12/07/2019 L hip flexor 4, abd and ext 3+; 01/29/2020: 4/5 for most hip motions, 3+/5 for abd, ext; 03/20/2020: 4/5 for hip movements    Time 8    Period Weeks    Status Achieved      PT LONG TERM GOAL #2   Title Patient will demonstrate walking tolerance increased to 30 minutes without increase in symptoms to demonstrate improved ability to perform walking program for cardiovascular health.    Baseline 10 minute tolerance (patient report); 12/22 10 minutes (per pt report); 11/09/2019: 7 min; 12/07/2019 unable to assess due  to icy streets;  01/29/2020: 10 min; 03/20/2020 10 min; 05/14/2020: 10 min x 3 per day    Time 8    Period Weeks    Status On-going      PT LONG TERM GOAL #3   Title Patient will report worst pain of 3/10 during therapy to demonstrate reduced disability    Baseline Worst 7/10; 12/22 worst 9/10; 11/09/2019: 9/10; 12/07/2019 7/10; 01/29/2020: 7/10; 05/14/2020: 0/ 10    Time 8    Period Weeks    Status Achieved      PT LONG TERM GOAL #4   Title Patient will demonstrate ability to  perform all bed mobility maneuvers without increase in pain for reduced disability with mobility and transfers with ADLs.    Baseline Reports pain with bed mobility supine <> sidelying L/R, sidelying to sitting EOB; 12/22 pt able to perform I without increased pain    Time 8    Period Weeks    Status Achieved      PT LONG TERM GOAL #5   Title Patient will improve 10MW speed to 1.0 m/sec to meet norms for minimal fall risk and full community ambulation.    Baseline 0.88 m/sec; 12/22 0.96 m/sec; 11/09/2019: deferred; 12/07/2019 0.90 m/sec; 01/29/2020: .9  m/sec; 03/20/2020: .33m/s; 05/14/2020: . 9 m/s    Time 8    Period Weeks    Status On-going      Additional Long Term Goals   Additional Long Term Goals Yes      PT LONG TERM GOAL #6   Title Patient will increase 6MWT by 200 ft to demonstrate improvement in functional capacity for transition to cardiac walking program    Baseline 6MWT = 1195; 1/7/20201: deferred; 12/07/2019 1095; 01/29/2020: deferred secondary to fatigue; 03/20/2020: 1099ft    Time 8    Period Weeks    Status Deferred      PT LONG TERM GOAL #7   Title Patient will improve her 5xsts to under 15 sec to indicate singificnat improvement in LE functional strength and decrease in fall risk.    Baseline 24.5 sec; 03/20/2020: 19.19 sec    Time 6    Period Weeks    Status On-going      PT LONG TERM GOAL #8   Title Patient will improve FGA to 28/30 to indicate improvement in dynamic balance and improvement in  stability.    Baseline 23/30    Time 6    Period Weeks    Status New                 Plan - 05/30/20 1526    Clinical Impression Statement Progressed performance of large movements during today's visit and patient able to perform greater amount of repetitions compared to the previous sessions indicating funcitonal carryover between session. While performing side stepping on airex beam x3 episodes of LOB requiring maxA to regain balance. Patient continues to have vestibular symptoms and will benefit from further skilled therapy to return to prior level of function.    Personal Factors and Comorbidities Age;Comorbidity 3+;Time since onset of injury/illness/exacerbation;Comorbidity 2    Comorbidities MCI, HTN, CA, cataracts    Examination-Activity Limitations Bend;Sit;Squat;Stairs;Stand;Sleep    Examination-Participation Restrictions Cleaning;Community Activity    Stability/Clinical Decision Making Evolving/Moderate complexity    Rehab Potential Fair    Clinical Impairments Affecting Rehab Potential MCI    PT Frequency 2x / week    PT Duration 6 weeks    PT Treatment/Interventions ADLs/Self Care Home Management;Therapeutic exercise;Patient/family education;Neuromuscular re-education;Therapeutic activities;Functional mobility training;Balance training;Manual techniques;Gait training;Stair training;Moist Heat;Electrical Stimulation;Cryotherapy;Taping;Dry needling;Energy conservation;Passive range of motion;Joint Manipulations    PT Next Visit Plan Progress glute therex, pelvic-hip dissociation; check and progress HEP with stretches    PT Home Exercise Plan lateral step ups, step ups, hip kickers ext/abd red theraband    Consulted and Agree with Plan of Care Patient           Patient will benefit from skilled therapeutic intervention in order to improve the following deficits and impairments:  Decreased range of motion, Postural dysfunction, Pain, Abnormal gait, Decreased activity  tolerance, Decreased coordination, Decreased endurance, Decreased  balance, Decreased mobility, Difficulty walking, Hypomobility, Increased muscle spasms, Impaired perceived functional ability, Decreased strength, Impaired flexibility  Visit Diagnosis: Muscle weakness (generalized)  Difficulty in walking, not elsewhere classified     Problem List Patient Active Problem List   Diagnosis Date Noted   Candidal skin infection 04/29/2020   Parkinson disease ([place]) 02/22/2020   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   Bipolar disorder, in full remission, most recent episode depressed ([place]) 11/08/2019   Avitaminosis D 08/24/2019   Insomnia due to mental condition 05/31/2019   Bipolar I disorder, most recent episode depressed ([place]) 05/31/2019   Mild cognitive disorder 05/31/2019   Alcohol use disorder, moderate, in sustained remission ([place]) 05/31/2019   Elevated tumor markers 05/27/2019   GAD (generalized anxiety disorder) 04/10/2019   Altered mental status 04/10/2019   MDD (major depressive disorder), severe ([place]) 01/31/2019   High serum carbohydrate antigen 19-9 (CA19-9) 12/01/2018   Adjustment disorder with anxious mood 11/24/2018   Essential hypertension 11/24/2018   B12 deficiency 10/17/2018   Incisional hernia, without obstruction or gangrene 09/12/2018   Genetic testing 04/07/2018   Family history of lung cancer 03/24/2018   Malignant neoplasm of upper-outer quadrant of right breast in female, estrogen receptor positive ([place]) 03/17/2018   Malignant neoplasm of upper-inner quadrant of right breast in female, estrogen receptor positive ([place]) 03/17/2018   History of alcohol dependence ([place]) 02/07/2018   History of psychosis 02/07/2018   History of insomnia 02/07/2018    Blythe Stanford, PT DPT 05/30/2020, 4:24 PM  [facility] [address] [place], [address] Phone: [phone]   Fax:   [phone]  Name: Kendra [name] MRN: [id number] Date of Birth: 1945-11-17

## 2020-06-11 ENCOUNTER — Ambulatory Visit: Payer: Medicare PPO

## 2020-06-11 ENCOUNTER — Ambulatory Visit: Payer: Medicare PPO | Attending: Orthopaedic Surgery | Admitting: Occupational Therapy

## 2020-06-11 ENCOUNTER — Other Ambulatory Visit: Payer: Self-pay

## 2020-06-11 DIAGNOSIS — M6281 Muscle weakness (generalized): Secondary | ICD-10-CM | POA: Diagnosis not present

## 2020-06-11 DIAGNOSIS — M25522 Pain in left elbow: Secondary | ICD-10-CM | POA: Diagnosis not present

## 2020-06-11 DIAGNOSIS — R262 Difficulty in walking, not elsewhere classified: Secondary | ICD-10-CM | POA: Diagnosis not present

## 2020-06-11 DIAGNOSIS — M25622 Stiffness of left elbow, not elsewhere classified: Secondary | ICD-10-CM | POA: Diagnosis not present

## 2020-06-11 NOTE — Therapy (Signed)
[facility] [address], [place], [address] Phone: [phone]   Fax:  [phone]  Occupational Therapy Treatment  Patient Details  Name: Kendra [name] MRN: [id number] Date of Birth: 1946-06-11 Referring Provider (OT): Vance Peper Date: 06/11/2020   OT End of Session - 06/11/20 1451    Visit Number 13    Number of Visits 13    Date for OT Re-Evaluation 06/11/20    OT Start Time 1404    OT Stop Time 1445    OT Time Calculation (min) 41 min    Activity Tolerance Patient tolerated treatment well    Behavior During Therapy [facility] for tasks assessed/performed           Past Medical History:  Diagnosis Date  . Anxiety   . Breast cancer ([place]) 03/2018   right breast cancer at 11:00 and 1:00  . Bursitis    bilateral hips and knees  . Complication of anesthesia   . Depression   . Family history of breast cancer 03/24/2018  . Family history of lung cancer 03/24/2018  . History of alcohol dependence ([place]) 2007   no ETOH; resolved since 2007  . History of hiatal hernia   . History of psychosis 2014   due to sleep disturbance  . Hypertension   . Personal history of radiation therapy 06/2018-07/2018   right breast ca  . PONV (postoperative nausea and vomiting)     Past Surgical History:  Procedure Laterality Date  . BREAST BIOPSY Right 03/14/2018   11:00 DCIS and invasive ductal carcinoma  . BREAST BIOPSY Right 03/14/2018   1:00 Invasive ductal carcinoma  . BREAST LUMPECTOMY Right 04/27/2018   lumpectomy of 11 and 1:00 cancers, clear margins, negative LN  . BREAST LUMPECTOMY WITH RADIOACTIVE SEED AND SENTINEL LYMPH NODE BIOPSY Right 04/27/2018   Procedure: RIGHT BREAST LUMPECTOMY WITH RADIOACTIVE SEED X 2 AND RIGHT SENTINEL LYMPH NODE BIOPSY;  Surgeon: [name], MD;  Location: [place];  Service: General;  Laterality: Right;  . CATARACT EXTRACTION W/PHACO Left  08/30/2019   Procedure: CATARACT EXTRACTION PHACO AND INTRAOCULAR LENS PLACEMENT ([place]) LEFT panoptix toric  01:20.5  12.7%  10.26;  Surgeon: Leandrew Koyanagi, MD;  Location: [place];  Service: Ophthalmology;  Laterality: Left;  . CATARACT EXTRACTION W/PHACO Right 09/20/2019   Procedure: CATARACT EXTRACTION PHACO AND INTRAOCULAR LENS PLACEMENT ([facility]) RIGHT 5.71  00:36.4  15.7%;  Surgeon: Leandrew Koyanagi, MD;  Location: [place];  Service: Ophthalmology;  Laterality: Right;  . LAPAROTOMY N/A 02/10/2018   Procedure: EXPLORATORY LAPAROTOMY;  Surgeon: Isabel Caprice, MD;  [facility];  Service: Gynecology;  Laterality: N/A;  . MASS EXCISION  01/2018   abdominal  . OVARIAN CYST REMOVAL    . SALPINGOOPHORECTOMY Bilateral 02/10/2018   Procedure: BILATERAL SALPINGO OOPHORECTOMY; PERITONEAL WASHINGS;  Surgeon: Isabel Caprice, MD;  [facility];  Service: Gynecology;  Laterality: Bilateral;  . TONSILLECTOMY    . TONSILLECTOMY AND ADENOIDECTOMY  1953    There were no vitals filed for this visit.   Subjective Assessment - 06/11/20 1449    Subjective  I did not join the gym yet- was doing the band exercises at home - if I joint the [employer] again -I'll do 3 x wk    Pertinent History Pt fell 11/23/2019 and fracture her R elbow - minimal displaced radius head fx - pt in sling if she is out and about -  but refer to OT for ROM ,PROM and weight limited 1-2 lbs -    Patient Stated Goals I would like the ROM , strength better so I can push up from chair and have no pain    Currently in Pain? No/denies              [facility] OT Assessment - 06/11/20 0001      AROM   Right Elbow Flexion 150    Right Elbow Extension 0    Left Elbow Flexion 150    Left Elbow Extension 0    Left Forearm Pronation 90 Degrees    Left Forearm Supination 90 Degrees      Strength   Right Hand Grip (lbs) 32    Right Hand Lateral Pinch 14 lbs    Right Hand 3 Point Pinch 14 lbs    Left Hand  Grip (lbs) 29    Left Hand Lateral Pinch 12 lbs    Left Hand 3 Point Pinch 12 lbs               L AROM for elbow compare to R - same for flexion - extention Strength flexion andextension4+/5-with end range weaker for elbow extention and over head pressing up     Kept at [employer] blue mix with light blue 3sets12 toget fingers into putty- grip about 4 sec into putty and can do R and L  Cont withWall pushupdoing well with per pt - 3 sets of 10-12pain free Pt to keep hands on shoulder height   able to lift and carry 9#  without any compensation at the UE.-  For reaching shoulder height and drop down to side and back on desk  10 lbs same but only 30 sec  5 lbs lift over head but not able to do repetively    Can cont doing same HEP at home - until get into [facility]  GTB for elbow extention and flexion - bilateral 12 reps2 sets GTB D1 and D2 patterns add for shoulder - bilateral 10 reps  No pain or issues can increase in 5-7 days to 2 sets and another 5 days 3 sets  1 x day  Cont with GTB for scapula squeezes2 sets   And punches with GTB - 2 sets of 12   Done with pt  And reviewed her HEP we took some video last time - for pt some execises to do at gym:  4 lbs for over head presses - 10 reps 3 lbs for punches up  L and R overhead 4 lbs for elbow curls  4lbs for tricep kickbacks-10reps   On BIODEX - if at gym Can do rowing 15 lbs - 15 reps  Chest press - 10 lbs 15 reps Elbow extention presses - 10 lbs - 15 reps Can do 2 sets = take breaks inbetween Pt to look into what other equipment there are -and to review next session  = for pt to transition to gym again - as prior to elbow fracture                  OT Education - 06/11/20 1450    Education Details HEP revies for doing at gym transitioning    Person(s) Educated Patient    Methods Explanation;Demonstration;Tactile cues;Verbal cues;Handout    Comprehension Verbal cues  required;Returned demonstration;Verbalized understanding            OT Short Term Goals - 02/21/20 1619      OT  SHORT TERM GOAL #1   Title Pt to be independent in HEP to increase L elbow extention to -5 and increase strenght to carry more than 5 lbs    Baseline elbow extention AROM -5 and PROM and in session 0 - carry and lift 5 lbs with ease    Status Achieved             OT Long Term Goals - 06/11/20 1454      OT LONG TERM GOAL #1   Title L Elbow extention improve for pt to pull shirt over head, push up and carry more than 8 lbs without increase symptoms    Status Achieved      OT LONG TERM GOAL #2   Title Pt AROM and strength in L elbow improve to prior level for function    Status Achieved      OT LONG TERM GOAL #3   Title Pt to show knowledge on exercises she can do at the [facility] to increase or condition UB and cardio    Baseline she has video clips on what to do at [facility] for UE strengthning    Status Achieved                 Plan - 06/11/20 1451    Clinical Impression Statement Pt is about 6 months out from L elbow fx - pt doing very well and has AROM WNL and strength 4+/5 - pt was diagnosis the last 6 months also with Parkinson's - pt able to use L UE in ADL's and IADL;s at home - pt use to do [employer] prior to fx - pt at this time ready to transition again to gym- reviewed again her HEP that she can do at the gym - pt discharg at this time    OT Occupational Profile and History Problem Focused Assessment - Including review of records relating to presenting problem    Occupational performance deficits (Please refer to evaluation for details): ADL's;IADL's;Play;Leisure;Social Participation    Body Structure / Function / Physical Skills ADL;Flexibility;ROM;UE functional use;Pain;Strength;IADL    Rehab Potential Good    Clinical Decision Making Limited treatment options, no task modification necessary    Comorbidities Affecting Occupational Performance: May have  comorbidities impacting occupational performance    Modification or Assistance to Complete Evaluation  No modification of tasks or assist necessary to complete eval    OT Treatment/Interventions Self-care/ADL training;Therapeutic exercise;Patient/family education;Cryotherapy;Paraffin;Moist Heat;Manual Therapy;Passive range of motion;Therapeutic activities    Plan pt to transition to [facility] doing exercises for UE    OT Home Exercise Plan see pt instruction    Consulted and Agree with Plan of Care Patient           Patient will benefit from skilled therapeutic intervention in order to improve the following deficits and impairments:   Body Structure / Function / Physical Skills: ADL, Flexibility, ROM, UE functional use, Pain, Strength, IADL       Visit Diagnosis: Stiffness of left elbow, not elsewhere classified  Pain in left elbow  Muscle weakness (generalized)    Problem List Patient Active Problem List   Diagnosis Date Noted  . Candidal skin infection 04/29/2020  . Parkinson disease ([place]) 02/22/2020  . 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 Bipolar disorder, in full remission, most recent episode depressed ([place]) 11/08/2019  . Avitaminosis D 08/24/2019  . Insomnia due to mental condition 05/31/2019  . Bipolar I disorder, most recent episode depressed ([place]) 05/31/2019  . Mild cognitive disorder 05/31/2019  .  Alcohol use disorder, moderate, in sustained remission ([place]) 05/31/2019  . Elevated tumor markers 05/27/2019  . GAD (generalized anxiety disorder) 04/10/2019  . Altered mental status 04/10/2019  . MDD (major depressive disorder), severe ([place]) 01/31/2019  . High serum carbohydrate antigen 19-9 (CA19-9) 12/01/2018  . Adjustment disorder with anxious mood 11/24/2018  . Essential hypertension 11/24/2018  . B12 deficiency 10/17/2018  . Incisional hernia, without obstruction or gangrene 09/12/2018  . Genetic testing 04/07/2018  . Family history of lung cancer 03/24/2018  .  Malignant neoplasm of upper-outer quadrant of right breast in female, estrogen receptor positive ([place]) 03/17/2018  . Malignant neoplasm of upper-inner quadrant of right breast in female, estrogen receptor positive ([place]) 03/17/2018  . History of alcohol dependence ([place]) 02/07/2018  . History of psychosis 02/07/2018  . History of insomnia 02/07/2018    Rosalyn Gess OTR/L,CLT 06/11/2020, 2:56 PM  [place] [address], [place], [address] Phone: [phone]   Fax:  [phone]  Name: Baudelia Schroepfer MRN: [id number] Date of Birth: 08/19/1946

## 2020-06-12 ENCOUNTER — Ambulatory Visit
Admission: RE | Admit: 2020-06-12 | Discharge: 2020-06-12 | Disposition: A | Discharge status: Home / Self Care | Payer: Medicare PPO | Source: Ambulatory Visit | Attending: Hematology and Oncology | Admitting: Hematology and Oncology

## 2020-06-12 ENCOUNTER — Telehealth: Payer: Self-pay

## 2020-06-12 ENCOUNTER — Other Ambulatory Visit: Payer: Self-pay | Admitting: Hematology and Oncology

## 2020-06-12 DIAGNOSIS — Z780 Asymptomatic menopausal state: Secondary | ICD-10-CM | POA: Diagnosis not present

## 2020-06-12 DIAGNOSIS — N6002 Solitary cyst of left breast: Secondary | ICD-10-CM | POA: Insufficient documentation

## 2020-06-12 DIAGNOSIS — Z853 Personal history of malignant neoplasm of breast: Secondary | ICD-10-CM | POA: Diagnosis not present

## 2020-06-12 DIAGNOSIS — Z170 Estrogen receptor positive status [ER+]: Secondary | ICD-10-CM

## 2020-06-12 DIAGNOSIS — C50411 Malignant neoplasm of upper-outer quadrant of right female breast: Secondary | ICD-10-CM

## 2020-06-12 DIAGNOSIS — M8580 Other specified disorders of bone density and structure, unspecified site: Secondary | ICD-10-CM | POA: Insufficient documentation

## 2020-06-12 DIAGNOSIS — N6320 Unspecified lump in the left breast, unspecified quadrant: Secondary | ICD-10-CM | POA: Diagnosis present

## 2020-06-12 DIAGNOSIS — Z923 Personal history of irradiation: Secondary | ICD-10-CM | POA: Insufficient documentation

## 2020-06-12 DIAGNOSIS — M85851 Other specified disorders of bone density and structure, right thigh: Secondary | ICD-10-CM | POA: Diagnosis not present

## 2020-06-12 DIAGNOSIS — Z13820 Encounter for screening for osteoporosis: Secondary | ICD-10-CM | POA: Diagnosis not present

## 2020-06-12 DIAGNOSIS — R922 Inconclusive mammogram: Secondary | ICD-10-CM | POA: Diagnosis not present

## 2020-06-12 NOTE — Telephone Encounter (Signed)
[facility] in [place] called and report that the patient has a possible mass on the left breast and want a verbal order to do a [place] on the left breast. I gave them the ok and she will put orders for Dr Mike Gip to sign today. MD has been made aware.

## 2020-06-12 NOTE — Therapy (Signed)
[place] [address], [place], [address] Phone: [phone]   Fax:  [phone]  Physical Therapy Treatment  Patient Details  Name: Kendra [name] MRN: [id number] Date of Birth: [birth year] No data recorded  Encounter Date: 06/11/2020   PT End of Session - 06/12/20 1320    Visit Number 49    Number of Visits 50    Date for PT Re-Evaluation 06/12/20    Authorization Type 8/10    Authorization Time Period until 06/12/2020    PT Start Time 8101    PT Stop Time 1515    PT Time Calculation (min) 30 min    Equipment Utilized During Treatment Gait belt    Activity Tolerance Patient tolerated treatment well;No increased pain    Behavior During Therapy WFL for tasks assessed/performed           Past Medical History:  Diagnosis Date  . Anxiety   . Breast cancer ([place]) 03/2018   right breast cancer at 11:00 and 1:00  . Bursitis    bilateral hips and knees  . Complication of anesthesia   . Depression   . Family history of breast cancer 03/24/2018  . Family history of lung cancer 03/24/2018  . History of alcohol dependence ([place]) 2007   no ETOH; resolved since 2007  . History of hiatal hernia   . History of psychosis 2014   due to sleep disturbance  . Hypertension   . Personal history of radiation therapy 06/2018-07/2018   right breast ca  . PONV (postoperative nausea and vomiting)     Past Surgical History:  Procedure Laterality Date  . BREAST BIOPSY Right 03/14/2018   11:00 DCIS and invasive ductal carcinoma  . BREAST BIOPSY Right 03/14/2018   1:00 Invasive ductal carcinoma  . BREAST LUMPECTOMY Right 04/27/2018   lumpectomy of 11 and 1:00 cancers, clear margins, negative LN  . BREAST LUMPECTOMY WITH RADIOACTIVE SEED AND SENTINEL LYMPH NODE BIOPSY Right 04/27/2018   Procedure: RIGHT BREAST LUMPECTOMY WITH RADIOACTIVE SEED X 2 AND RIGHT SENTINEL LYMPH NODE BIOPSY;  Surgeon: [name], MD;   Location: [place];  Service: General;  Laterality: Right;  . CATARACT EXTRACTION W/PHACO Left 08/30/2019   Procedure: CATARACT EXTRACTION PHACO AND INTRAOCULAR LENS PLACEMENT ([place]) LEFT panoptix toric  01:20.5  12.7%  10.26;  Surgeon: Leandrew Koyanagi, MD;  Location: [place];  Service: Ophthalmology;  Laterality: Left;  . CATARACT EXTRACTION W/PHACO Right 09/20/2019   Procedure: CATARACT EXTRACTION PHACO AND INTRAOCULAR LENS PLACEMENT ([facility]) RIGHT 5.71  00:36.4  15.7%;  Surgeon: Leandrew Koyanagi, MD;  Location: [place];  Service: Ophthalmology;  Laterality: Right;  . LAPAROTOMY N/A 02/10/2018   Procedure: EXPLORATORY LAPAROTOMY;  Surgeon: Isabel Caprice, MD;  [facility];  Service: Gynecology;  Laterality: N/A;  . MASS EXCISION  01/2018   abdominal  . OVARIAN CYST REMOVAL    . SALPINGOOPHORECTOMY Bilateral 02/10/2018   Procedure: BILATERAL SALPINGO OOPHORECTOMY; PERITONEAL WASHINGS;  Surgeon: Isabel Caprice, MD;  [facility];  Service: Gynecology;  Laterality: Bilateral;  . TONSILLECTOMY    . TONSILLECTOMY AND ADENOIDECTOMY  1953    There were no vitals filed for this visit.   Subjective Assessment - 06/11/20 1446    Subjective Patient reports she has been performing her exercises, however reports she continues to have dizziness with standing and walking. Patient states she has been kicking her by accident while ambulating.    Pertinent  History Patient report unclear regarding onset. Visited MD on Sept 16 and received injection with relief for approximately 1 month. Reports she cannot receive an additional injection pending eye surgery. Received dx from MD of hip bursitis in B hips. Reports pain with walking, stanindg, sitting 5/10 current 7/10 worst 3/10 best, with temporary relief when she is sitting "just right" and with naproxen. Agg: activity. Cormorbidities: hx of breast cancer, HTN, MCI, insomnia, cataracts    Patient Stated  Goals Be able to move without hurting, walk 1 hour    Currently in Pain? No/denies              TREATMENT Neuromuscular rehabilitation VOR up/down; left/right, circular head turns  - x 10  Circular head turns in walking - x 10  Seated reaches out down and up and behind - x 10  Stairs reciprocal gait with CGA - x 4; step to with UE support x 4 Head turns amb up/down, left/right - 2 x 58ft Stepping over 9" block without stopping - x 2 Tandem ambulation - 2 x 7ft EC, backwards ambulation - 2 x 32ft  Performed exercises to address vestibular limitations.    PT Education - 06/12/20 1319    Education provided Yes    Education Details form/technique with exercise    Person(s) Educated Patient    Methods Explanation;Demonstration    Comprehension Verbalized understanding;Returned demonstration            PT Short Term Goals - 09/12/19 1419      PT SHORT TERM GOAL #1   Title Patient will demonstrate complaince with HEP to speed recovery and reduce total number of visits.    Baseline HEP given    Time 2    Period Weeks    Status New    Target Date 09/27/19             PT Long Term Goals - 06/11/20 1546      PT LONG TERM GOAL #1   Title Patient will improve hip strength to 4/5 grossly to facilitate improved mobility with ADLs.    Baseline 3, 4-; 12/22 deferred to NV; 11/09/2019: 4-/5; 12/07/2019 L hip flexor 4, abd and ext 3+; 01/29/2020: 4/5 for most hip motions, 3+/5 for abd, ext; 03/20/2020: 4/5 for hip movements    Time 8    Period Weeks    Status Achieved      PT LONG TERM GOAL #2   Title Patient will demonstrate walking tolerance increased to 30 minutes without increase in symptoms to demonstrate improved ability to perform walking program for cardiovascular health.    Baseline 10 minute tolerance (patient report); 12/22 10 minutes (per pt report); 11/09/2019: 7 min; 12/07/2019 unable to assess due to icy streets;  01/29/2020: 10 min; 03/20/2020 10 min; 05/14/2020: 10 min x  3 per day; 06/11/2020: 29min x 3 per day    Time 8    Period Weeks    Status On-going      PT LONG TERM GOAL #3   Title Patient will report worst pain of 3/10 during therapy to demonstrate reduced disability    Baseline Worst 7/10; 12/22 worst 9/10; 11/09/2019: 9/10; 12/07/2019 7/10; 01/29/2020: 7/10; 05/14/2020: 0/ 10    Time 8    Period Weeks    Status Achieved      PT LONG TERM GOAL #4   Title Patient will demonstrate ability to perform all bed mobility maneuvers without increase in pain for reduced disability with mobility and  transfers with ADLs.    Baseline Reports pain with bed mobility supine <> sidelying L/R, sidelying to sitting EOB; 12/22 pt able to perform I without increased pain    Time 8    Period Weeks    Status Achieved      PT LONG TERM GOAL #5   Title Patient will improve 10MW speed to 1.0 m/sec to meet norms for minimal fall risk and full community ambulation.    Baseline 0.88 m/sec; 12/22 0.96 m/sec; 11/09/2019: deferred; 12/07/2019 0.90 m/sec; 01/29/2020: .9  m/sec; 03/20/2020: .64m/s; 05/14/2020: . 9 m/s; 06/11/2020: .40m/s    Time 8    Period Weeks    Status On-going      PT LONG TERM GOAL #6   Title Patient will increase 6MWT by 200 ft to demonstrate improvement in functional capacity for transition to cardiac walking program    Baseline 6MWT = 1195; 1/7/20201: deferred; 12/07/2019 1095; 01/29/2020: deferred secondary to fatigue; 03/20/2020: 1053ft    Time 8    Period Weeks    Status Deferred      PT LONG TERM GOAL #7   Title Patient will improve her 5xsts to under 15 sec to indicate singificnat improvement in LE functional strength and decrease in fall risk.    Baseline 24.5 sec; 03/20/2020: 19.19 sec    Time 6    Period Weeks    Status Deferred      PT LONG TERM GOAL #8   Title Patient will improve FGA to 28/30 to indicate improvement in dynamic balance and improvement in stability.    Baseline 23/30; 06/11/2020: 25/30    Time 6    Period Weeks    Status On-going                  Plan - 06/12/20 1321    Clinical Impression Statement Patient continues to exhibit increased dizziness with performing standing and walking based exercises, most notably with performing head turns with eye tracking and with VOR indicating limitations of the vestibular system. This is increasing overall fall risk, however, she does exhibit improvement with performance FGA with an improvement of 23/30 to 25/30. Patient has the largest limitations with head turns and with EC balance. Patient will benefit form further skilled focused on improving balance exercises to return to decrease fall risk.    Personal Factors and Comorbidities Age;Comorbidity 3+;Time since onset of injury/illness/exacerbation;Comorbidity 2    Comorbidities MCI, HTN, CA, cataracts    Examination-Activity Limitations Bend;Sit;Squat;Stairs;Stand;Sleep    Examination-Participation Restrictions Cleaning;Community Activity    Stability/Clinical Decision Making Evolving/Moderate complexity    Rehab Potential Fair    Clinical Impairments Affecting Rehab Potential MCI    PT Frequency 2x / week    PT Duration 6 weeks    PT Treatment/Interventions ADLs/Self Care Home Management;Therapeutic exercise;Patient/family education;Neuromuscular re-education;Therapeutic activities;Functional mobility training;Balance training;Manual techniques;Gait training;Stair training;Moist Heat;Electrical Stimulation;Cryotherapy;Taping;Dry needling;Energy conservation;Passive range of motion;Joint Manipulations    PT Next Visit Plan Progress glute therex, pelvic-hip dissociation; check and progress HEP with stretches    PT Home Exercise Plan lateral step ups, step ups, hip kickers ext/abd red theraband    Consulted and Agree with Plan of Care Patient           Patient will benefit from skilled therapeutic intervention in order to improve the following deficits and impairments:  Decreased range of motion, Postural dysfunction, Pain,  Abnormal gait, Decreased activity tolerance, Decreased coordination, Decreased endurance, Decreased balance, Decreased mobility, Difficulty walking, Hypomobility, Increased muscle spasms,  Impaired perceived functional ability, Decreased strength, Impaired flexibility  Visit Diagnosis: Muscle weakness (generalized)  Difficulty in walking, not elsewhere classified     Problem List Patient Active Problem List   Diagnosis Date Noted  . Candidal skin infection 04/29/2020  . Parkinson disease ([place]) 02/22/2020  . 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 Bipolar disorder, in full remission, most recent episode depressed ([place]) 11/08/2019  . Avitaminosis D 08/24/2019  . Insomnia due to mental condition 05/31/2019  . Bipolar I disorder, most recent episode depressed ([place]) 05/31/2019  . Mild cognitive disorder 05/31/2019  . Alcohol use disorder, moderate, in sustained remission ([place]) 05/31/2019  . Elevated tumor markers 05/27/2019  . GAD (generalized anxiety disorder) 04/10/2019  . Altered mental status 04/10/2019  . MDD (major depressive disorder), severe ([place]) 01/31/2019  . High serum carbohydrate antigen 19-9 (CA19-9) 12/01/2018  . Adjustment disorder with anxious mood 11/24/2018  . Essential hypertension 11/24/2018  . B12 deficiency 10/17/2018  . Incisional hernia, without obstruction or gangrene 09/12/2018  . Genetic testing 04/07/2018  . Family history of lung cancer 03/24/2018  . Malignant neoplasm of upper-outer quadrant of right breast in female, estrogen receptor positive ([place]) 03/17/2018  . Malignant neoplasm of upper-inner quadrant of right breast in female, estrogen receptor positive ([place]) 03/17/2018  . History of alcohol dependence ([place]) 02/07/2018  . History of psychosis 02/07/2018  . History of insomnia 02/07/2018    Blythe Stanford, PT DPT 06/12/2020, 1:32 PM  [facility] [address] [place],  [address] Phone: [phone]   Fax:  [phone]  Name: Kendra [name] MRN: [id number] Date of Birth: [birth year]

## 2020-06-13 ENCOUNTER — Ambulatory Visit: Payer: Medicare PPO

## 2020-06-13 ENCOUNTER — Other Ambulatory Visit: Payer: Self-pay

## 2020-06-13 DIAGNOSIS — R262 Difficulty in walking, not elsewhere classified: Secondary | ICD-10-CM | POA: Diagnosis not present

## 2020-06-13 DIAGNOSIS — M6281 Muscle weakness (generalized): Secondary | ICD-10-CM

## 2020-06-13 DIAGNOSIS — M25622 Stiffness of left elbow, not elsewhere classified: Secondary | ICD-10-CM | POA: Diagnosis not present

## 2020-06-13 DIAGNOSIS — M25522 Pain in left elbow: Secondary | ICD-10-CM | POA: Diagnosis not present

## 2020-06-13 NOTE — Therapy (Signed)
[facility] [address] [place], [address] Phone: [phone]   Fax:  [phone]  Physical Therapy Treatment  Patient Details  Name: Kendra [name] MRN: [id number] Date of Birth: [birth year] No data recorded  Encounter Date: 06/13/2020   PT End of Session - 06/13/20 1552    Visit Number 50    Number of Visits 50    Date for PT Re-Evaluation 06/12/20    Authorization Type 9/10    Authorization Time Period until 06/12/2020    PT Start Time 1545    PT Stop Time 1630    PT Time Calculation (min) 45 min    Equipment Utilized During Treatment Gait belt    Activity Tolerance Patient tolerated treatment well;No increased pain    Behavior During Therapy WFL for tasks assessed/performed           Past Medical History:  Diagnosis Date  . Anxiety   . Breast cancer ([place]) 03/2018   right breast cancer at 11:00 and 1:00  . Bursitis    bilateral hips and knees  . Complication of anesthesia   . Depression   . Family history of breast cancer 03/24/2018  . Family history of lung cancer 03/24/2018  . History of alcohol dependence ([place]) 2007   no ETOH; resolved since 2007  . History of hiatal hernia   . History of psychosis 2014   due to sleep disturbance  . Hypertension   . Personal history of radiation therapy 06/2018-07/2018   right breast ca  . PONV (postoperative nausea and vomiting)     Past Surgical History:  Procedure Laterality Date  . BREAST BIOPSY Right 03/14/2018   11:00 DCIS and invasive ductal carcinoma  . BREAST BIOPSY Right 03/14/2018   1:00 Invasive ductal carcinoma  . BREAST LUMPECTOMY Right 04/27/2018   lumpectomy of 11 and 1:00 cancers, clear margins, negative LN  . BREAST LUMPECTOMY WITH RADIOACTIVE SEED AND SENTINEL LYMPH NODE BIOPSY Right 04/27/2018   Procedure: RIGHT BREAST LUMPECTOMY WITH RADIOACTIVE SEED X 2 AND RIGHT SENTINEL LYMPH NODE BIOPSY;  Surgeon: [name], MD;   Location: [facility];  Service: General;  Laterality: Right;  . CATARACT EXTRACTION W/PHACO Left 08/30/2019   Procedure: CATARACT EXTRACTION PHACO AND INTRAOCULAR LENS PLACEMENT ([place]) LEFT panoptix toric  01:20.5  12.7%  10.26;  Surgeon: Leandrew Koyanagi, MD;  Location: [facility];  Service: Ophthalmology;  Laterality: Left;  . CATARACT EXTRACTION W/PHACO Right 09/20/2019   Procedure: CATARACT EXTRACTION PHACO AND INTRAOCULAR LENS PLACEMENT ([facility]) RIGHT 5.71  00:36.4  15.7%;  Surgeon: Leandrew Koyanagi, MD;  Location: [facility];  Service: Ophthalmology;  Laterality: Right;  . LAPAROTOMY N/A 02/10/2018   Procedure: EXPLORATORY LAPAROTOMY;  Surgeon: Isabel Caprice, MD;  Location: [facility];  Service: Gynecology;  Laterality: N/A;  . MASS EXCISION  01/2018   abdominal  . OVARIAN CYST REMOVAL    . SALPINGOOPHORECTOMY Bilateral 02/10/2018   Procedure: BILATERAL SALPINGO OOPHORECTOMY; PERITONEAL WASHINGS;  Surgeon: Isabel Caprice, MD;  [facility];  Service: Gynecology;  Laterality: Bilateral;  . TONSILLECTOMY    . TONSILLECTOMY AND ADENOIDECTOMY  1953    There were no vitals filed for this visit.   Subjective Assessment - 06/13/20 1551    Subjective Patient states she is feeling increased dizziness today secondary to the heat. Patient reports she conitnues to have difficulty with performing walking and standing without increased dizziness.    Pertinent History Patient report  unclear regarding onset. Visited MD on Sept 16 and received injection with relief for approximately 1 month. Reports she cannot receive an additional injection pending eye surgery. Received dx from MD of hip bursitis in B hips. Reports pain with walking, stanindg, sitting 5/10 current 7/10 worst 3/10 best, with temporary relief when she is sitting "just right" and with naproxen. Agg: activity. Cormorbidities: hx of breast cancer, HTN, MCI, insomnia, cataracts    Patient Stated Goals Be  able to move without hurting, walk 1 hour    Currently in Pain? No/denies               TREATMENT Neuromuscular rehabilitation Head turns amb up/down, left/right - 4 x 41ft Seated reaches out down and up and behind - x 10  VOR up/down; left/right, while standing on airex pad with busy background - 2 x 20 Backwards working - 2 x 1ft EC walking - 2 x 38ft Eye tracking with busy backgrounds walking forwards in all directions - 2 x 84ft VOR with circular head turns with busy background - 2 x 47ft   Performed exercises to address vestibular limitations.     PT Education - 06/13/20 1552    Education provided Yes    Education Details form/technique with exercise    Person(s) Educated Patient    Methods Explanation;Demonstration    Comprehension Verbalized understanding;Returned demonstration            PT Short Term Goals - 09/12/19 1419      PT SHORT TERM GOAL #1   Title Patient will demonstrate complaince with HEP to speed recovery and reduce total number of visits.    Baseline HEP given    Time 2    Period Weeks    Status New    Target Date 09/27/19             PT Long Term Goals - 06/13/20 1557      PT LONG TERM GOAL #1   Title Patient will improve hip strength to 4/5 grossly to facilitate improved mobility with ADLs.    Baseline 3, 4-; 12/22 deferred to NV; 11/09/2019: 4-/5; 12/07/2019 L hip flexor 4, abd and ext 3+; 01/29/2020: 4/5 for most hip motions, 3+/5 for abd, ext; 03/20/2020: 4/5 for hip movements    Time 8    Period Weeks    Status Achieved      PT LONG TERM GOAL #2   Title Patient will demonstrate walking tolerance increased to 30 minutes without increase in symptoms to demonstrate improved ability to perform walking program for cardiovascular health.    Baseline 10 minute tolerance (patient report); 12/22 10 minutes (per pt report); 11/09/2019: 7 min; 12/07/2019 unable to assess due to icy streets;  01/29/2020: 10 min; 03/20/2020 10 min; 05/14/2020: 10 min x  3 per day; 06/11/2020: 24min x 3 per day    Time 8    Period Weeks    Status On-going      PT LONG TERM GOAL #3   Title Patient will report worst pain of 3/10 during therapy to demonstrate reduced disability    Baseline Worst 7/10; 12/22 worst 9/10; 11/09/2019: 9/10; 12/07/2019 7/10; 01/29/2020: 7/10; 05/14/2020: 0/ 10    Time 8    Period Weeks    Status Achieved      PT LONG TERM GOAL #4   Title Patient will demonstrate ability to perform all bed mobility maneuvers without increase in pain for reduced disability with mobility and transfers with ADLs.  Baseline Reports pain with bed mobility supine <> sidelying L/R, sidelying to sitting EOB; 12/22 pt able to perform I without increased pain    Time 8    Period Weeks    Status Achieved      PT LONG TERM GOAL #5   Title Patient will improve 10MW speed to 1.0 m/sec to meet norms for minimal fall risk and full community ambulation.    Baseline 0.88 m/sec; 12/22 0.96 m/sec; 11/09/2019: deferred; 12/07/2019 0.90 m/sec; 01/29/2020: .9  m/sec; 03/20/2020: .59m/s; 05/14/2020: . 9 m/s; 06/11/2020: .56m/s    Time 8    Period Weeks    Status On-going      PT LONG TERM GOAL #6   Title Patient will increase 6MWT by 200 ft to demonstrate improvement in functional capacity for transition to cardiac walking program    Baseline 6MWT = 1195; 1/7/20201: deferred; 12/07/2019 1095; 01/29/2020: deferred secondary to fatigue; 03/20/2020: 1014ft    Time 8    Period Weeks    Status Deferred      PT LONG TERM GOAL #7   Title Patient will improve her 5xsts to under 15 sec to indicate singificnat improvement in LE functional strength and decrease in fall risk.    Baseline 24.5 sec; 03/20/2020: 19.19 sec; 06/13/2020: 14. 92    Time 6    Period Weeks    Status Achieved      PT LONG TERM GOAL #8   Title Patient will improve FGA to 28/30 to indicate improvement in dynamic balance and improvement in stability.    Baseline 23/30; 06/11/2020: 25/30    Time 6    Period Weeks     Status On-going                 Plan - 06/13/20 1605    Clinical Impression Statement Continued to perform exercises focusing on improving VOR and vestibular symptoms. Improvement with 5xSTS compared to the previous sessions indicating functional carryover between sessions in terms of functional strength. Patient continues to have limitations with vestibular system, most notably with VOR. Patient will benefit from further skilled therapy focused on improving limitations to return to prior level of function.    Personal Factors and Comorbidities Age;Comorbidity 3+;Time since onset of injury/illness/exacerbation;Comorbidity 2    Comorbidities MCI, HTN, CA, cataracts    Examination-Activity Limitations Bend;Sit;Squat;Stairs;Stand;Sleep    Examination-Participation Restrictions Cleaning;Community Activity    Stability/Clinical Decision Making Evolving/Moderate complexity    Rehab Potential Fair    Clinical Impairments Affecting Rehab Potential MCI    PT Frequency 2x / week    PT Duration 6 weeks    PT Treatment/Interventions ADLs/Self Care Home Management;Therapeutic exercise;Patient/family education;Neuromuscular re-education;Therapeutic activities;Functional mobility training;Balance training;Manual techniques;Gait training;Stair training;Moist Heat;Electrical Stimulation;Cryotherapy;Taping;Dry needling;Energy conservation;Passive range of motion;Joint Manipulations    PT Next Visit Plan Progress glute therex, pelvic-hip dissociation; check and progress HEP with stretches    PT Home Exercise Plan lateral step ups, step ups, hip kickers ext/abd red theraband    Consulted and Agree with Plan of Care Patient           Patient will benefit from skilled therapeutic intervention in order to improve the following deficits and impairments:  Decreased range of motion, Postural dysfunction, Pain, Abnormal gait, Decreased activity tolerance, Decreased coordination, Decreased endurance, Decreased  balance, Decreased mobility, Difficulty walking, Hypomobility, Increased muscle spasms, Impaired perceived functional ability, Decreased strength, Impaired flexibility  Visit Diagnosis: Muscle weakness (generalized)  Difficulty in walking, not elsewhere classified  Problem List Patient Active Problem List   Diagnosis Date Noted  . Candidal skin infection 04/29/2020  . Parkinson disease ([place]) 02/22/2020  . 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 Bipolar disorder, in full remission, most recent episode depressed ([place]) 11/08/2019  . Avitaminosis D 08/24/2019  . Insomnia due to mental condition 05/31/2019  . Bipolar I disorder, most recent episode depressed ([place]) 05/31/2019  . Mild cognitive disorder 05/31/2019  . Alcohol use disorder, moderate, in sustained remission ([place]) 05/31/2019  . Elevated tumor markers 05/27/2019  . GAD (generalized anxiety disorder) 04/10/2019  . Altered mental status 04/10/2019  . MDD (major depressive disorder), severe ([place]) 01/31/2019  . High serum carbohydrate antigen 19-9 (CA19-9) 12/01/2018  . Adjustment disorder with anxious mood 11/24/2018  . Essential hypertension 11/24/2018  . B12 deficiency 10/17/2018  . Incisional hernia, without obstruction or gangrene 09/12/2018  . Genetic testing 04/07/2018  . Family history of lung cancer 03/24/2018  . Malignant neoplasm of upper-outer quadrant of right breast in female, estrogen receptor positive ([place]) 03/17/2018  . Malignant neoplasm of upper-inner quadrant of right breast in female, estrogen receptor positive ([place]) 03/17/2018  . History of alcohol dependence ([place]) 02/07/2018  . History of psychosis 02/07/2018  . History of insomnia 02/07/2018    Blythe Stanford, PT DPT 06/13/2020, 4:36 PM  [facility] [address] [place], [address] Phone: [phone]   Fax:  [phone]  Name: Kendra [name] MRN: [id number] Date of Birth:  1946-08-12

## 2020-06-19 ENCOUNTER — Other Ambulatory Visit: Payer: Self-pay

## 2020-06-19 ENCOUNTER — Ambulatory Visit: Payer: Medicare PPO

## 2020-06-19 DIAGNOSIS — M6281 Muscle weakness (generalized): Secondary | ICD-10-CM | POA: Diagnosis not present

## 2020-06-19 DIAGNOSIS — M25622 Stiffness of left elbow, not elsewhere classified: Secondary | ICD-10-CM | POA: Diagnosis not present

## 2020-06-19 DIAGNOSIS — M25522 Pain in left elbow: Secondary | ICD-10-CM | POA: Diagnosis not present

## 2020-06-19 DIAGNOSIS — R262 Difficulty in walking, not elsewhere classified: Secondary | ICD-10-CM

## 2020-06-19 NOTE — Therapy (Signed)
[facility] [address] [place], [address] Phone: [phone]   Fax:  [phone]  Physical Therapy Treatment  Patient Details  Name: Kendra [name] MRN: [id number] Date of Birth: [birth year] No data recorded  Encounter Date: 06/19/2020   PT End of Session - 06/19/20 1437    Visit Number 51    Number of Visits 55    Date for PT Re-Evaluation 07/14/20    Authorization Type 9/10    Authorization Time Period until 06/12/2020    PT Start Time 1345    PT Stop Time 1430    PT Time Calculation (min) 45 min    Equipment Utilized During Treatment Gait belt    Activity Tolerance Patient tolerated treatment well;No increased pain    Behavior During Therapy WFL for tasks assessed/performed           Past Medical History:  Diagnosis Date  . Anxiety   . Breast cancer ([place]) 03/2018   right breast cancer at 11:00 and 1:00  . Bursitis    bilateral hips and knees  . Complication of anesthesia   . Depression   . Family history of breast cancer 03/24/2018  . Family history of lung cancer 03/24/2018  . History of alcohol dependence ([place]) 2007   no ETOH; resolved since 2007  . History of hiatal hernia   . History of psychosis 2014   due to sleep disturbance  . Hypertension   . Personal history of radiation therapy 06/2018-07/2018   right breast ca  . PONV (postoperative nausea and vomiting)     Past Surgical History:  Procedure Laterality Date  . BREAST BIOPSY Right 03/14/2018   11:00 DCIS and invasive ductal carcinoma  . BREAST BIOPSY Right 03/14/2018   1:00 Invasive ductal carcinoma  . BREAST LUMPECTOMY Right 04/27/2018   lumpectomy of 11 and 1:00 cancers, clear margins, negative LN  . BREAST LUMPECTOMY WITH RADIOACTIVE SEED AND SENTINEL LYMPH NODE BIOPSY Right 04/27/2018   Procedure: RIGHT BREAST LUMPECTOMY WITH RADIOACTIVE SEED X 2 AND RIGHT SENTINEL LYMPH NODE BIOPSY;  Surgeon: [name], MD;   Location: [facility];  Service: General;  Laterality: Right;  . CATARACT EXTRACTION W/PHACO Left 08/30/2019   Procedure: CATARACT EXTRACTION PHACO AND INTRAOCULAR LENS PLACEMENT ([place]) LEFT panoptix toric  01:20.5  12.7%  10.26;  Surgeon: Leandrew Koyanagi, MD;  Location: [facility];  Service: Ophthalmology;  Laterality: Left;  . CATARACT EXTRACTION W/PHACO Right 09/20/2019   Procedure: CATARACT EXTRACTION PHACO AND INTRAOCULAR LENS PLACEMENT ([facility]) RIGHT 5.71  00:36.4  15.7%;  Surgeon: Leandrew Koyanagi, MD;  Location: [facility];  Service: Ophthalmology;  Laterality: Right;  . LAPAROTOMY N/A 02/10/2018   Procedure: EXPLORATORY LAPAROTOMY;  Surgeon: Isabel Caprice, MD;  [facility];  Service: Gynecology;  Laterality: N/A;  . MASS EXCISION  01/2018   abdominal  . OVARIAN CYST REMOVAL    . SALPINGOOPHORECTOMY Bilateral 02/10/2018   Procedure: BILATERAL SALPINGO OOPHORECTOMY; PERITONEAL WASHINGS;  Surgeon: Isabel Caprice, MD;  [facility];  Service: Gynecology;  Laterality: Bilateral;  . TONSILLECTOMY    . TONSILLECTOMY AND ADENOIDECTOMY  1953    There were no vitals filed for this visit.   Subjective Assessment - 06/19/20 1354    Subjective Patient states the dizziness is about the same intensity compared to the previous sessions.    Pertinent History Patient report unclear regarding onset. Visited MD on Sept 16 and received injection with relief  for approximately 1 month. Reports she cannot receive an additional injection pending eye surgery. Received dx from MD of hip bursitis in B hips. Reports pain with walking, stanindg, sitting 5/10 current 7/10 worst 3/10 best, with temporary relief when she is sitting "just right" and with naproxen. Agg: activity. Cormorbidities: hx of breast cancer, HTN, MCI, insomnia, cataracts    Patient Stated Goals Be able to move without hurting, walk 1 hour    Currently in Pain? No/denies              TREATMENT Neuromuscular rehabilitation Head turns amb up/down, left/right - 4 x 2ft Seated reaches out down and up and behind - x 10  EC walking - 4 x 101ft EC walking with head turns up/down - 4 x 53ft VOR up/down; left/right, sitting - x 15 with busy backgrounds Standing on airex pad with EC - 2.5 min    Performed exercises to address vestibular limitations.     PT Education - 06/19/20 1436    Education provided Yes    Education Details form/technique with exercise    Person(s) Educated Patient    Methods Explanation;Demonstration    Comprehension Returned demonstration;Verbalized understanding            PT Short Term Goals - 09/12/19 1419      PT SHORT TERM GOAL #1   Title Patient will demonstrate complaince with HEP to speed recovery and reduce total number of visits.    Baseline HEP given    Time 2    Period Weeks    Status New    Target Date 09/27/19             PT Long Term Goals - 06/13/20 1557      PT LONG TERM GOAL #1   Title Patient will improve hip strength to 4/5 grossly to facilitate improved mobility with ADLs.    Baseline 3, 4-; 12/22 deferred to NV; 11/09/2019: 4-/5; 12/07/2019 L hip flexor 4, abd and ext 3+; 01/29/2020: 4/5 for most hip motions, 3+/5 for abd, ext; 03/20/2020: 4/5 for hip movements    Time 8    Period Weeks    Status Achieved      PT LONG TERM GOAL #2   Title Patient will demonstrate walking tolerance increased to 30 minutes without increase in symptoms to demonstrate improved ability to perform walking program for cardiovascular health.    Baseline 10 minute tolerance (patient report); 12/22 10 minutes (per pt report); 11/09/2019: 7 min; 12/07/2019 unable to assess due to icy streets;  01/29/2020: 10 min; 03/20/2020 10 min; 05/14/2020: 10 min x 3 per day; 06/11/2020: 65min x 3 per day    Time 8    Period Weeks    Status On-going      PT LONG TERM GOAL #3   Title Patient will report worst pain of 3/10 during therapy to demonstrate reduced  disability    Baseline Worst 7/10; 12/22 worst 9/10; 11/09/2019: 9/10; 12/07/2019 7/10; 01/29/2020: 7/10; 05/14/2020: 0/ 10    Time 8    Period Weeks    Status Achieved      PT LONG TERM GOAL #4   Title Patient will demonstrate ability to perform all bed mobility maneuvers without increase in pain for reduced disability with mobility and transfers with ADLs.    Baseline Reports pain with bed mobility supine <> sidelying L/R, sidelying to sitting EOB; 12/22 pt able to perform I without increased pain    Time 8  Period Weeks    Status Achieved      PT LONG TERM GOAL #5   Title Patient will improve 10MW speed to 1.0 m/sec to meet norms for minimal fall risk and full community ambulation.    Baseline 0.88 m/sec; 12/22 0.96 m/sec; 11/09/2019: deferred; 12/07/2019 0.90 m/sec; 01/29/2020: .9  m/sec; 03/20/2020: .64m/s; 05/14/2020: . 9 m/s; 06/11/2020: .21m/s    Time 8    Period Weeks    Status On-going      PT LONG TERM GOAL #6   Title Patient will increase 6MWT by 200 ft to demonstrate improvement in functional capacity for transition to cardiac walking program    Baseline 6MWT = 1195; 1/7/20201: deferred; 12/07/2019 1095; 01/29/2020: deferred secondary to fatigue; 03/20/2020: 1078ft    Time 8    Period Weeks    Status Deferred      PT LONG TERM GOAL #7   Title Patient will improve her 5xsts to under 15 sec to indicate singificnat improvement in LE functional strength and decrease in fall risk.    Baseline 24.5 sec; 03/20/2020: 19.19 sec; 06/13/2020: 14. 92    Time 6    Period Weeks    Status Achieved      PT LONG TERM GOAL #8   Title Patient will improve FGA to 28/30 to indicate improvement in dynamic balance and improvement in stability.    Baseline 23/30; 06/11/2020: 25/30    Time 6    Period Weeks    Status On-going                 Plan - 06/19/20 1437    Clinical Impression Statement Progressed EC ambulation to encorporate head turns, increased difficulty with this requiring redirection  x3 for 66ft walking section indicating vestibular limitations and dysfunciton. Improvement with EC walking as indicated by improvement with speed. Will continue to benefit from further skilled therapy focused on improving these limitations to return to prior level of function.    Personal Factors and Comorbidities Age;Comorbidity 3+;Time since onset of injury/illness/exacerbation;Comorbidity 2    Comorbidities MCI, HTN, CA, cataracts    Examination-Activity Limitations Bend;Sit;Squat;Stairs;Stand;Sleep    Examination-Participation Restrictions Cleaning;Community Activity    Stability/Clinical Decision Making Evolving/Moderate complexity    Rehab Potential Fair    Clinical Impairments Affecting Rehab Potential MCI    PT Frequency 2x / week    PT Duration 6 weeks    PT Treatment/Interventions ADLs/Self Care Home Management;Therapeutic exercise;Patient/family education;Neuromuscular re-education;Therapeutic activities;Functional mobility training;Balance training;Manual techniques;Gait training;Stair training;Moist Heat;Electrical Stimulation;Cryotherapy;Taping;Dry needling;Energy conservation;Passive range of motion;Joint Manipulations    PT Next Visit Plan Progress glute therex, pelvic-hip dissociation; check and progress HEP with stretches    PT Home Exercise Plan lateral step ups, step ups, hip kickers ext/abd red theraband    Consulted and Agree with Plan of Care Patient           Patient will benefit from skilled therapeutic intervention in order to improve the following deficits and impairments:  Decreased range of motion, Postural dysfunction, Pain, Abnormal gait, Decreased activity tolerance, Decreased coordination, Decreased endurance, Decreased balance, Decreased mobility, Difficulty walking, Hypomobility, Increased muscle spasms, Impaired perceived functional ability, Decreased strength, Impaired flexibility  Visit Diagnosis: Muscle weakness (generalized)  Difficulty in walking, not  elsewhere classified     Problem List Patient Active Problem List   Diagnosis Date Noted  . Candidal skin infection 04/29/2020  . Parkinson disease ([place]) 02/22/2020  . 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 Bipolar disorder, in full remission, most recent  episode depressed ([place]) 11/08/2019  . Avitaminosis D 08/24/2019  . Insomnia due to mental condition 05/31/2019  . Bipolar I disorder, most recent episode depressed ([place]) 05/31/2019  . Mild cognitive disorder 05/31/2019  . Alcohol use disorder, moderate, in sustained remission ([place]) 05/31/2019  . Elevated tumor markers 05/27/2019  . GAD (generalized anxiety disorder) 04/10/2019  . Altered mental status 04/10/2019  . MDD (major depressive disorder), severe ([place]) 01/31/2019  . High serum carbohydrate antigen 19-9 (CA19-9) 12/01/2018  . Adjustment disorder with anxious mood 11/24/2018  . Essential hypertension 11/24/2018  . B12 deficiency 10/17/2018  . Incisional hernia, without obstruction or gangrene 09/12/2018  . Genetic testing 04/07/2018  . Family history of lung cancer 03/24/2018  . Malignant neoplasm of upper-outer quadrant of right breast in female, estrogen receptor positive ([place]) 03/17/2018  . Malignant neoplasm of upper-inner quadrant of right breast in female, estrogen receptor positive ([place]) 03/17/2018  . History of alcohol dependence ([place]) 02/07/2018  . History of psychosis 02/07/2018  . History of insomnia 02/07/2018    Blythe Stanford, PT DPT 06/19/2020, 2:39 PM  [facility] [address] [place], [address] Phone: [phone]   Fax:  [phone]  Name: Linnell Swords MRN: [id number] Date of Birth: 1946-04-15

## 2020-06-26 ENCOUNTER — Other Ambulatory Visit: Payer: Self-pay

## 2020-06-26 ENCOUNTER — Encounter: Payer: Self-pay | Admitting: Psychiatry

## 2020-06-26 ENCOUNTER — Telehealth (INDEPENDENT_AMBULATORY_CARE_PROVIDER_SITE_OTHER): Payer: Medicare PPO | Admitting: Psychiatry

## 2020-06-26 DIAGNOSIS — F3176 Bipolar disorder, in full remission, most recent episode depressed: Secondary | ICD-10-CM

## 2020-06-26 DIAGNOSIS — F09 Unspecified mental disorder due to known physiological condition: Secondary | ICD-10-CM | POA: Diagnosis not present

## 2020-06-26 DIAGNOSIS — F411 Generalized anxiety disorder: Secondary | ICD-10-CM

## 2020-06-26 DIAGNOSIS — F5105 Insomnia due to other mental disorder: Secondary | ICD-10-CM | POA: Diagnosis not present

## 2020-06-26 MED ORDER — FLUOXETINE HCL 60 MG PO TABS: 60.0000 mg | ORAL_TABLET | Freq: Every day | ORAL | 1 refills | Status: DC

## 2020-06-26 NOTE — Progress Notes (Signed)
Provider Location : [facility] Patient Location : Home  Participants: Patient , Provider  Virtual Visit via Telephone Note  I connected with Kendra [name] on 06/26/20 at  3:00 PM EDT by telephone and verified that I am speaking with the correct person using two identifiers.   I discussed the limitations, risks, security and privacy concerns of performing an evaluation and management service by telephone and the availability of in person appointments. I also discussed with the patient that there may be a patient responsible charge related to this service. The patient expressed understanding and agreed to proceed.   I discussed the assessment and treatment plan with the patient. The patient was provided an opportunity to ask questions and all were answered. The patient agreed with the plan and demonstrated an understanding of the instructions.   The patient was advised to call back or seek an in-person evaluation if the symptoms worsen or if the condition fails to improve as anticipated.   Kendra [name] Progress Note  06/26/2020 6:28 PM Kendra [name]  MRN:  [id number]  Chief Complaint:  Chief Complaint    Follow-up     HPI: Kendra [name] is a 74 year old Caucasian female, married, lives in [place], has a history of bipolar disorder, GAD, insomnia, cognitive disorder, multifocal stage I right breast cancer status post treatment-lumpectomy, ovarian cyst removal, Parkinson's disease was evaluated by phone today.  Patient today reports she is currently doing well with regards to her mood.  She denies any significant sadness or anxiety symptoms.  She reports she continues to have problems due to recent Parkinson's diagnosis however overall she is functioning okay.  She continues to take medications for the same and is tolerating them well.  Patient reports sleep is good.  Patient denies side effects to medications.  She reports she has not had psychotherapy sessions recently and is waiting for  her therapist to call her back.  Patient continues to have good support system from her husband.    Visit Diagnosis:    ICD-10-CM   1. Bipolar disorder, in full remission, most recent episode depressed (HCC)  F31.76 FLUoxetine HCl 60 MG TABS   stable  2. GAD (generalized anxiety disorder)  F41.1 FLUoxetine HCl 60 MG TABS  3. Insomnia due to mental condition  F51.05 FLUoxetine HCl 60 MG TABS  4. Mild cognitive disorder  F09     Past Psychiatric History: I have reviewed past psychiatric history from my progress note on 03/01/2019.  Past trials of Prozac, risperidone, Seroquel, Ativan  Past Medical History:  Past Medical History:  Diagnosis Date   Anxiety    Breast cancer ([place]) 03/2018   right breast cancer at 11:00 and 1:00   Bursitis    bilateral hips and knees   Complication of anesthesia    Depression    Family history of breast cancer 03/24/2018   Family history of lung cancer 03/24/2018   History of alcohol dependence ([place]) 2007   no ETOH; resolved since 2007   History of hiatal hernia    History of psychosis 2014   due to sleep disturbance   Hypertension    Personal history of radiation therapy 06/2018-07/2018   right breast ca   PONV (postoperative nausea and vomiting)     Past Surgical History:  Procedure Laterality Date   BREAST BIOPSY Right 03/14/2018   11:00 DCIS and invasive ductal carcinoma   BREAST BIOPSY Right 03/14/2018   1:00 Invasive ductal carcinoma   BREAST LUMPECTOMY Right 04/27/2018  lumpectomy of 11 and 1:00 cancers, clear margins, negative LN   BREAST LUMPECTOMY WITH RADIOACTIVE SEED AND SENTINEL LYMPH NODE BIOPSY Right 04/27/2018   Procedure: RIGHT BREAST LUMPECTOMY WITH RADIOACTIVE SEED X 2 AND RIGHT SENTINEL LYMPH NODE BIOPSY;  Surgeon: [name], MD;  Location: [facility];  Service: General;  Laterality: Right;   CATARACT EXTRACTION W/PHACO Left 08/30/2019   Procedure: CATARACT EXTRACTION PHACO AND  INTRAOCULAR LENS PLACEMENT ([place]) LEFT panoptix toric  01:20.5  12.7%  10.26;  Surgeon: Leandrew Koyanagi, MD;  Location: [facility];  Service: Ophthalmology;  Laterality: Left;   CATARACT EXTRACTION W/PHACO Right 09/20/2019   Procedure: CATARACT EXTRACTION PHACO AND INTRAOCULAR LENS PLACEMENT ([facility]) RIGHT 5.71  00:36.4  15.7%;  Surgeon: Leandrew Koyanagi, MD;  Location: [facility];  Service: Ophthalmology;  Laterality: Right;   LAPAROTOMY N/A 02/10/2018   Procedure: EXPLORATORY LAPAROTOMY;  Surgeon: Isabel Caprice, MD;  Location: [facility];  Service: Gynecology;  Laterality: N/A;   MASS EXCISION  01/2018   abdominal   OVARIAN CYST REMOVAL     SALPINGOOPHORECTOMY Bilateral 02/10/2018   Procedure: BILATERAL SALPINGO OOPHORECTOMY; PERITONEAL WASHINGS;  Surgeon: Isabel Caprice, MD;  Location: [facility];  Service: Gynecology;  Laterality: Bilateral;   TONSILLECTOMY     TONSILLECTOMY AND ADENOIDECTOMY  1953    Family Psychiatric History: I have reviewed family psychiatric history from my progress note on 03/01/2019  Family History:  Family History  Problem Relation Age of Onset   Diabetes Mother    Breast cancer Mother 16   Hypertension Mother    Lung cancer Father        asbestos exposure   Heart disease Brother 7   Breast cancer Cousin 8       paternal cousin   Heart disease Paternal Grandmother 41   Heart disease Paternal Grandfather     Social History: I have reviewed social history from my progress note on 03/01/2019 Social History   Socioeconomic History   Marital status: Married    Spouse name: robert   Number of children: 0   Years of education: Not on file   Highest education level: Some college, no degree  Occupational History   Occupation: retired Furniture conservator/restorer of education  Tobacco Use   Smoking status: Never Smoker   Smokeless tobacco: Never Used  Scientific laboratory technician Use: Never used  Substance and Sexual  Activity   Alcohol use: Not Currently    Comment: alcohol dependence prior to 2007   Drug use: Never   Sexual activity: Yes    Partners: Male    Birth control/protection: Surgical  Other Topics Concern   Not on file  Social History Narrative   Not on file   Social Determinants of Health   Financial Resource Strain:    Difficulty of Paying Living Expenses: Not on file  Food Insecurity:    Worried About Charity fundraiser in the Last Year: Not on file   [employer] of Food in the Last Year: Not on file  Transportation Needs:    Lack of Transportation (Medical): Not on file   Lack of Transportation (Non-Medical): Not on file  Physical Activity:    Days of Exercise per Week: Not on file   Minutes of Exercise per Session: Not on file  Stress:    Feeling of Stress : Not on file  Social Connections:    Frequency of Communication with Friends and Family: Not on file  Frequency of Social Gatherings with Friends and Family: Not on file   Attends Religious Services: Not on file   Active Member of Clubs or Organizations: Not on file   Attends Archivist Meetings: Not on file   Marital Status: Not on file    Allergies:  Allergies  Allergen Reactions   Hydroxyzine Anaphylaxis    Tongue swollen   Other Other (See Comments)    Food poisoning    Shrimp [Shellfish Allergy] Other (See Comments)    Food poisoning     Metabolic Disorder Labs: Lab Results  Component Value Date   HGBA1C 5.4 04/13/2019   Lab Results  Component Value Date   PROLACTIN 101.9 06/20/2015   Lab Results  Component Value Date   CHOL 173 04/13/2019   TRIG 78 04/13/2019   HDL 62 04/13/2019   [place] 95 04/13/2019   LDLCALC 103 06/02/2017   Lab Results  Component Value Date   TSH 2.31 04/13/2019   TSH 2.543 07/25/2018    Therapeutic Level Labs: No results found for: LITHIUM No results found for: VALPROATE No components found for:  CBMZ  Current  Medications: Current Outpatient Medications  Medication Sig Dispense Refill   FLUoxetine HCl 60 MG TABS Take 60 mg by mouth daily. 90 tablet 1   risperiDONE (RISPERDAL) 2 MG tablet Take 0.5 tablets (1 mg total) by mouth 2 (two) times daily. 90 tablet 1   traZODone (DESYREL) 50 MG tablet Take 0.5-1 tablets (25-50 mg total) by mouth at bedtime as needed for sleep. 90 tablet 1   Acetaminophen 500 MG capsule  (Patient not taking: Reported on 04/29/2020)     calcium citrate-vitamin D (CITRACAL+D) 315-200 MG-UNIT tablet Take 4 tablets by mouth daily.     carbidopa-levodopa (SINEMET IR) 25-100 MG tablet Take 0.5 tablets by mouth in the morning, at noon, and at bedtime.     cephALEXin (KEFLEX) 500 MG capsule Take 1 capsule (500 mg total) by mouth 3 (three) times daily. (Patient not taking: Reported on 04/29/2020) 15 capsule 0   cyanocobalamin (,VITAMIN B-12,) 1000 MCG/ML injection Inject 1 mL (1,000 mcg total) into the muscle every 30 (thirty) days. 3 mL 3   ketoconazole (NIZORAL) 2 % cream Apply 1 application topically daily. 15 g 0   letrozole (FEMARA) 2.5 MG tablet Take 1 tablet (2.5 mg total) by mouth daily. 90 tablet 3   lisinopril-hydrochlorothiazide (ZESTORETIC) 20-12.5 MG tablet Take 1 tablet by mouth daily. 90 tablet 3   nystatin (MYCOSTATIN/NYSTOP) powder Apply 1 application topically 3 (three) times daily. 15 g 1   polyethylene glycol (MIRALAX / GLYCOLAX) 17 g packet Take 17 g by mouth daily.     Current Facility-Administered Medications  Medication Dose Route Frequency Provider Last Rate Last Admin   lidocaine (PF) (XYLOCAINE) 1 % injection 4 mL  4 mL Intradermal Once Bacigalupo, Dionne Bucy, MD       lidocaine (PF) (XYLOCAINE) 1 % injection 4 mL  4 mL Intradermal Once Virginia Crews, MD       methylPREDNISolone acetate (DEPO-MEDROL) injection 40 mg  40 mg Intramuscular Once Virginia Crews, MD       methylPREDNISolone acetate (DEPO-MEDROL) injection 40 mg  40 mg  Intramuscular Once Brita Romp Dionne Bucy, MD         Musculoskeletal: Strength & Muscle Tone: [place]: UTA Patient leans: N/A  Psychiatric Specialty Exam: Review of Systems  Neurological: Positive for tremors.  Psychiatric/Behavioral: Negative for agitation, behavioral problems, confusion,  decreased concentration, dysphoric mood, hallucinations, self-injury, sleep disturbance and suicidal ideas. The patient is not nervous/anxious and is not hyperactive.   All other systems reviewed and are negative.   There were no vitals taken for this visit.There is no height or weight on file to calculate BMI.  General Appearance: UTA  Eye Contact:  UTA  Speech:  Normal Rate  Volume:  Normal  Mood:  Euthymic  Affect:  UTA  Thought Process:  Goal Directed and Descriptions of Associations: Intact  Orientation:  Full (Time, Place, and Person)  Thought Content: Logical   Suicidal Thoughts:  No  Homicidal Thoughts:  No  Memory:  Immediate;   Fair Recent;   Fair Remote;   Fair  Judgement:  Fair  Insight:  Fair  Psychomotor Activity:  UTA  Concentration:  Concentration: Fair and Attention Span: Fair  Recall:  [employer] of Knowledge: Fair  Language: Fair  Akathisia:  No  Handed:  Right  AIMS (if indicated): UTA  Assets:  Communication Skills Desire for Improvement Housing Social Support  ADL's:  Intact  Cognition: WNL  Sleep:  Fair   Screenings: GAD-7     Counselor 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 [facility]  Total GAD-7 Score 7    PHQ2-9     Counselor 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 [facility] Office Visit from 11/24/2018 in [facility]  PHQ-2 Total Score 3 0  PHQ-9 Total Score 17 3       Assessment and Plan: Kendra [name] is a 74 year old Caucasian female, married, lives in [place], has a history of bipolar disorder, GAD, insomnia, multiple medical problems, recent diagnosis of primary Parkinson's disease, was  evaluated by phone today.  Patient is biologically predisposed given her multiple health issues.  Patient with psychosocial stressors of recent diagnosis of primary Parkinson's disease, current pandemic.  Patient is currently doing well however will continue to benefit from medications and psychotherapy sessions.  Plan as noted below.  Plan Bipolar disorder in remission Risperidone at reduced dose to 2 mg p.o. daily in divided dosage. Prozac 60 mg p.o. daily  GAD-stable Continue CBT as needed.  I have communicated with staff here regarding patient needing an appointment with therapist. Continue Prozac as prescribed  Insomnia-stable Trazodone 25 to 50 mg p.o. nightly as needed Continue physical therapy sessions as needed for pain which can also affect her sleep. Continue melatonin as needed at bedtime for sleep  For cognitive issues-continue follow-up with neurology  Follow-up in clinic in 2 months or sooner if needed.  I have spent atleast 20 minutes non face to face with patient today. More than 50 % of the time was spent for preparing to see the patient ( e.g., review of test, records ),ordering medications and test ,psychoeducation and supportive psychotherapy and care coordination,as well as documenting clinical information in electronic health record. This note was generated in part or whole with voice recognition software. Voice recognition is usually quite accurate but there are transcription errors that can and very often do occur. I apologize for any typographical errors that were not detected and corrected.       Ursula Alert, MD 06/26/2020, 6:28 PM

## 2020-06-27 ENCOUNTER — Ambulatory Visit (INDEPENDENT_AMBULATORY_CARE_PROVIDER_SITE_OTHER): Payer: Medicare PPO | Admitting: Licensed Clinical Social Worker

## 2020-06-27 DIAGNOSIS — F411 Generalized anxiety disorder: Secondary | ICD-10-CM | POA: Diagnosis not present

## 2020-06-27 DIAGNOSIS — F3176 Bipolar disorder, in full remission, most recent episode depressed: Secondary | ICD-10-CM

## 2020-06-27 NOTE — Progress Notes (Signed)
Virtual Visit via Telephone Note  I connected with Kendra [name] on 06/27/20 at  2:30 PM EDT by telephone and verified that I am speaking with the correct person using two identifiers.  Location: Patient: home Provider: [facility]   I discussed the limitations, risks, security and privacy concerns of performing an evaluation and management service by telephone and the availability of in person appointments. I also discussed with the patient that there may be a patient responsible charge related to this service. The patient expressed understanding and agreed to proceed.   The patient was advised to call back or seek an in-person evaluation if the symptoms worsen or if the condition fails to improve as anticipated.  I provided 60 minutes of non-face-to-face time during this encounter.   Kendra [name] R Kendra Brugger, LCSW   THERAPIST PROGRESS NOTE  Session Time: 2:30-3:30 pm  Participation Level: Active  Behavioral Response: NAAlertgenerally pleasant  Type of Therapy: Individual Therapy  Treatment Goals addressed: Coping  Interventions: Strength-based and Supportive  Summary: Kendra [name] is a 74 y.o. female who presents with improving symptoms related to her diagnoses of bipolar disorder and generalized anxiety disorder. Pt reports stable mood, feels she is managing anxiety/stress well, good appetite, and has good social engagement/supports. Pt reports that she is resting well at night--takes 1/2 trazodone most evenings. Occasionally will need the second half to sleep through the night.   Allowed pt to express thoughts and feelings about parkinson's diagnosis. Pt recognizes the psychological impact it has had on her, and is coping well. Allowed pt to brainstorm through multiple interventions that are working well for her: painting, daily physical activity, good relationship with physical therapy team, cognitively stimulating activities, and numerous social engagements with friends and loved ones.   Pt reports that she truly feels happy and that she has the ability to find joy in every day.  Used strengths-based approach to allow pt to see her own abilities and how the strengths add to her self-worth. Pt is very thankful and feeling confident at time of session.   Suicidal/Homicidal: No  Therapist Response: Kendra [name] reports that her mood is stable and that her anxiety levels are stable. This is indicative of continuing progress.   Plan: Return again in 4 weeks. Ongoing treatment plan to include maintaining current levels of progress and symptom relapse. Continue to focus on self care and overall life balance.  Diagnosis: Axis I: Bipolar, Depressed and Generalized Anxiety Disorder    Axis II: No diagnosis    [place], LCSW 06/27/2020

## 2020-07-03 ENCOUNTER — Ambulatory Visit: Payer: Medicare PPO | Attending: Family Medicine

## 2020-07-03 ENCOUNTER — Other Ambulatory Visit: Payer: Self-pay

## 2020-07-03 DIAGNOSIS — R262 Difficulty in walking, not elsewhere classified: Secondary | ICD-10-CM | POA: Insufficient documentation

## 2020-07-03 DIAGNOSIS — M6281 Muscle weakness (generalized): Secondary | ICD-10-CM | POA: Diagnosis not present

## 2020-07-03 NOTE — Therapy (Signed)
[facility] [address], [place], [address] Phone: [phone]   Fax:  [phone]  Physical Therapy Treatment  Patient Details  Name: Kendra [name] MRN: [id number] Date of Birth: 12/25/1945 No data recorded  Encounter Date: 07/03/2020   PT End of Session - 07/03/20 1453    Visit Number 52    Number of Visits 55    Date for PT Re-Evaluation 07/14/20    Authorization Type 9/10    Authorization Time Period until 06/12/2020    PT Start Time 1400    PT Stop Time 1445    PT Time Calculation (min) 45 min    Equipment Utilized During Treatment Gait belt    Activity Tolerance Patient tolerated treatment well;No increased pain    Behavior During Therapy WFL for tasks assessed/performed           Past Medical History:  Diagnosis Date  . Anxiety   . Breast cancer ([place]) 03/2018   right breast cancer at 11:00 and 1:00  . Bursitis    bilateral hips and knees  . Complication of anesthesia   . Depression   . Family history of breast cancer 03/24/2018  . Family history of lung cancer 03/24/2018  . History of alcohol dependence ([place]) 2007   no ETOH; resolved since 2007  . History of hiatal hernia   . History of psychosis 2014   due to sleep disturbance  . Hypertension   . Personal history of radiation therapy 06/2018-07/2018   right breast ca  . PONV (postoperative nausea and vomiting)     Past Surgical History:  Procedure Laterality Date  . BREAST BIOPSY Right 03/14/2018   11:00 DCIS and invasive ductal carcinoma  . BREAST BIOPSY Right 03/14/2018   1:00 Invasive ductal carcinoma  . BREAST LUMPECTOMY Right 04/27/2018   lumpectomy of 11 and 1:00 cancers, clear margins, negative LN  . BREAST LUMPECTOMY WITH RADIOACTIVE SEED AND SENTINEL LYMPH NODE BIOPSY Right 04/27/2018   Procedure: RIGHT BREAST LUMPECTOMY WITH RADIOACTIVE SEED X 2 AND RIGHT SENTINEL LYMPH NODE BIOPSY;  Surgeon: [name], MD;   Location: [facility];  Service: General;  Laterality: Right;  . CATARACT EXTRACTION W/PHACO Left 08/30/2019   Procedure: CATARACT EXTRACTION PHACO AND INTRAOCULAR LENS PLACEMENT ([place]) LEFT panoptix toric  01:20.5  12.7%  10.26;  Surgeon: Leandrew Koyanagi, MD;  Location: [facility];  Service: Ophthalmology;  Laterality: Left;  . CATARACT EXTRACTION W/PHACO Right 09/20/2019   Procedure: CATARACT EXTRACTION PHACO AND INTRAOCULAR LENS PLACEMENT ([facility]) RIGHT 5.71  00:36.4  15.7%;  Surgeon: Leandrew Koyanagi, MD;  Location: [facility];  Service: Ophthalmology;  Laterality: Right;  . LAPAROTOMY N/A 02/10/2018   Procedure: EXPLORATORY LAPAROTOMY;  Surgeon: Isabel Caprice, MD;  [facility];  Service: Gynecology;  Laterality: N/A;  . MASS EXCISION  01/2018   abdominal  . OVARIAN CYST REMOVAL    . SALPINGOOPHORECTOMY Bilateral 02/10/2018   Procedure: BILATERAL SALPINGO OOPHORECTOMY; PERITONEAL WASHINGS;  Surgeon: Isabel Caprice, MD;  [facility];  Service: Gynecology;  Laterality: Bilateral;  . TONSILLECTOMY    . TONSILLECTOMY AND ADENOIDECTOMY  1953    There were no vitals filed for this visit.   Subjective Assessment - 07/03/20 1410    Subjective Patient reports she conitnues to feel dizziness and out of balance in standing. Patient states she believes it can go away, and is ready for that to happen.    Pertinent History Patient  report unclear regarding onset. Visited MD on Sept 16 and received injection with relief for approximately 1 month. Reports she cannot receive an additional injection pending eye surgery. Received dx from MD of hip bursitis in B hips. Reports pain with walking, stanindg, sitting 5/10 current 7/10 worst 3/10 best, with temporary relief when she is sitting "just right" and with naproxen. Agg: activity. Cormorbidities: hx of breast cancer, HTN, MCI, insomnia, cataracts    Patient Stated Goals Be able to move without hurting, walk 1  hour    Currently in Pain? No/denies             TREATMENT Neuromuscular rehabilitation Seated reaches out down and up and behind - x 10  Step forward/backward  with clap forward Step outs to the side with reaching out Head turns amb up/down, left/right - 4 x 57ft EC walking - 4 x 53ft Backwards walking without UE support - x 69ft    Performed exercises to address vestibular limitations.       PT Education - 07/03/20 1411    Education provided Yes    Education Details form/technique with exercise    Person(s) Educated Patient    Methods Explanation;Demonstration    Comprehension Verbalized understanding;Returned demonstration            PT Short Term Goals - 09/12/19 1419      PT SHORT TERM GOAL #1   Title Patient will demonstrate complaince with HEP to speed recovery and reduce total number of visits.    Baseline HEP given    Time 2    Period Weeks    Status New    Target Date 09/27/19             PT Long Term Goals - 06/13/20 1557      PT LONG TERM GOAL #1   Title Patient will improve hip strength to 4/5 grossly to facilitate improved mobility with ADLs.    Baseline 3, 4-; 12/22 deferred to NV; 11/09/2019: 4-/5; 12/07/2019 L hip flexor 4, abd and ext 3+; 01/29/2020: 4/5 for most hip motions, 3+/5 for abd, ext; 03/20/2020: 4/5 for hip movements    Time 8    Period Weeks    Status Achieved      PT LONG TERM GOAL #2   Title Patient will demonstrate walking tolerance increased to 30 minutes without increase in symptoms to demonstrate improved ability to perform walking program for cardiovascular health.    Baseline 10 minute tolerance (patient report); 12/22 10 minutes (per pt report); 11/09/2019: 7 min; 12/07/2019 unable to assess due to icy streets;  01/29/2020: 10 min; 03/20/2020 10 min; 05/14/2020: 10 min x 3 per day; 06/11/2020: 65min x 3 per day    Time 8    Period Weeks    Status On-going      PT LONG TERM GOAL #3   Title Patient will report worst pain of 3/10  during therapy to demonstrate reduced disability    Baseline Worst 7/10; 12/22 worst 9/10; 11/09/2019: 9/10; 12/07/2019 7/10; 01/29/2020: 7/10; 05/14/2020: 0/ 10    Time 8    Period Weeks    Status Achieved      PT LONG TERM GOAL #4   Title Patient will demonstrate ability to perform all bed mobility maneuvers without increase in pain for reduced disability with mobility and transfers with ADLs.    Baseline Reports pain with bed mobility supine <> sidelying L/R, sidelying to sitting EOB; 12/22 pt able to perform I without  increased pain    Time 8    Period Weeks    Status Achieved      PT LONG TERM GOAL #5   Title Patient will improve 10MW speed to 1.0 m/sec to meet norms for minimal fall risk and full community ambulation.    Baseline 0.88 m/sec; 12/22 0.96 m/sec; 11/09/2019: deferred; 12/07/2019 0.90 m/sec; 01/29/2020: .9  m/sec; 03/20/2020: .29m/s; 05/14/2020: . 9 m/s; 06/11/2020: .75m/s    Time 8    Period Weeks    Status On-going      PT LONG TERM GOAL #6   Title Patient will increase 6MWT by 200 ft to demonstrate improvement in functional capacity for transition to cardiac walking program    Baseline 6MWT = 1195; 1/7/20201: deferred; 12/07/2019 1095; 01/29/2020: deferred secondary to fatigue; 03/20/2020: 1069ft    Time 8    Period Weeks    Status Deferred      PT LONG TERM GOAL #7   Title Patient will improve her 5xsts to under 15 sec to indicate singificnat improvement in LE functional strength and decrease in fall risk.    Baseline 24.5 sec; 03/20/2020: 19.19 sec; 06/13/2020: 14. 92    Time 6    Period Weeks    Status Achieved      PT LONG TERM GOAL #8   Title Patient will improve FGA to 28/30 to indicate improvement in dynamic balance and improvement in stability.    Baseline 23/30; 06/11/2020: 25/30    Time 6    Period Weeks    Status On-going                 Plan - 07/03/20 1517    Clinical Impression Statement Increased difficulty with performing backward ambulation secondary  to onset of vertigo while performing. This is most likely from a limitation in the vestibular system. Patient is improving with her balance activities, most notably with performing large motions to decrease effects of Parkinson's. Although patient is improving she continues to have increased dizziness in standing. Patient will benefit from further skilled therapy to return to prior level of function.    Personal Factors and Comorbidities Age;Comorbidity 3+;Time since onset of injury/illness/exacerbation;Comorbidity 2    Comorbidities MCI, HTN, CA, cataracts    Examination-Activity Limitations Bend;Sit;Squat;Stairs;Stand;Sleep    Examination-Participation Restrictions Cleaning;Community Activity    Stability/Clinical Decision Making Evolving/Moderate complexity    Rehab Potential Fair    Clinical Impairments Affecting Rehab Potential MCI    PT Frequency 2x / week    PT Duration 6 weeks    PT Treatment/Interventions ADLs/Self Care Home Management;Therapeutic exercise;Patient/family education;Neuromuscular re-education;Therapeutic activities;Functional mobility training;Balance training;Manual techniques;Gait training;Stair training;Moist Heat;Electrical Stimulation;Cryotherapy;Taping;Dry needling;Energy conservation;Passive range of motion;Joint Manipulations    PT Next Visit Plan Progress glute therex, pelvic-hip dissociation; check and progress HEP with stretches    PT Home Exercise Plan lateral step ups, step ups, hip kickers ext/abd red theraband    Consulted and Agree with Plan of Care Patient           Patient will benefit from skilled therapeutic intervention in order to improve the following deficits and impairments:  Decreased range of motion, Postural dysfunction, Pain, Abnormal gait, Decreased activity tolerance, Decreased coordination, Decreased endurance, Decreased balance, Decreased mobility, Difficulty walking, Hypomobility, Increased muscle spasms, Impaired perceived functional  ability, Decreased strength, Impaired flexibility  Visit Diagnosis: Muscle weakness (generalized)  Difficulty in walking, not elsewhere classified     Problem List Patient Active Problem List   Diagnosis Date Noted  .  Candidal skin infection 04/29/2020  . Parkinson disease ([place]) 02/22/2020  . 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 Bipolar disorder, in full remission, most recent episode depressed ([place]) 11/08/2019  . Avitaminosis D 08/24/2019  . Insomnia due to mental condition 05/31/2019  . Bipolar I disorder, most recent episode depressed ([place]) 05/31/2019  . Mild cognitive disorder 05/31/2019  . Alcohol use disorder, moderate, in sustained remission ([place]) 05/31/2019  . Elevated tumor markers 05/27/2019  . GAD (generalized anxiety disorder) 04/10/2019  . Altered mental status 04/10/2019  . MDD (major depressive disorder), severe ([place]) 01/31/2019  . High serum carbohydrate antigen 19-9 (CA19-9) 12/01/2018  . Adjustment disorder with anxious mood 11/24/2018  . Essential hypertension 11/24/2018  . B12 deficiency 10/17/2018  . Incisional hernia, without obstruction or gangrene 09/12/2018  . Genetic testing 04/07/2018  . Family history of lung cancer 03/24/2018  . Malignant neoplasm of upper-outer quadrant of right breast in female, estrogen receptor positive ([place]) 03/17/2018  . Malignant neoplasm of upper-inner quadrant of right breast in female, estrogen receptor positive ([place]) 03/17/2018  . History of alcohol dependence ([place]) 02/07/2018  . History of psychosis 02/07/2018  . History of insomnia 02/07/2018    Blythe Stanford, PT DPT 07/03/2020, 3:24 PM  [facility] [address] [place], [address] Phone: [phone]   Fax:  [phone]  Name: Alpa Salvo MRN: [id number] Date of Birth: May 14, 1946

## 2020-07-11 ENCOUNTER — Ambulatory Visit: Payer: Medicare PPO

## 2020-07-15 ENCOUNTER — Ambulatory Visit: Payer: Medicare PPO

## 2020-07-15 ENCOUNTER — Other Ambulatory Visit: Payer: Self-pay

## 2020-07-15 DIAGNOSIS — M6281 Muscle weakness (generalized): Secondary | ICD-10-CM | POA: Diagnosis not present

## 2020-07-15 DIAGNOSIS — R262 Difficulty in walking, not elsewhere classified: Secondary | ICD-10-CM

## 2020-07-15 NOTE — Addendum Note (Signed)
Addended by: Blain Pais on: 07/15/2020 04:47 PM   Modules accepted: Orders

## 2020-07-15 NOTE — Therapy (Signed)
[facility] [address], [place], [address] Phone: [phone]   Fax:  [phone]  Physical Therapy Treatment  Patient Details  Name: Kendra [name] MRN: [id number] Date of Birth: Jul 24, 1946 No data recorded  Encounter Date: 07/15/2020   PT End of Session - 07/15/20 0909    Visit Number 39    Number of Visits 55    Date for PT Re-Evaluation 07/14/20    Authorization Type 10/10    Authorization Time Period until 06/12/2020    PT Start Time 0900    PT Stop Time 0945    PT Time Calculation (min) 45 min    Equipment Utilized During Treatment Gait belt    Activity Tolerance Patient tolerated treatment well;No increased pain    Behavior During Therapy WFL for tasks assessed/performed           Past Medical History:  Diagnosis Date  . Anxiety   . Breast cancer ([place]) 03/2018   right breast cancer at 11:00 and 1:00  . Bursitis    bilateral hips and knees  . Complication of anesthesia   . Depression   . Family history of breast cancer 03/24/2018  . Family history of lung cancer 03/24/2018  . History of alcohol dependence ([place]) 2007   no ETOH; resolved since 2007  . History of hiatal hernia   . History of psychosis 2014   due to sleep disturbance  . Hypertension   . Personal history of radiation therapy 06/2018-07/2018   right breast ca  . PONV (postoperative nausea and vomiting)     Past Surgical History:  Procedure Laterality Date  . BREAST BIOPSY Right 03/14/2018   11:00 DCIS and invasive ductal carcinoma  . BREAST BIOPSY Right 03/14/2018   1:00 Invasive ductal carcinoma  . BREAST LUMPECTOMY Right 04/27/2018   lumpectomy of 11 and 1:00 cancers, clear margins, negative LN  . BREAST LUMPECTOMY WITH RADIOACTIVE SEED AND SENTINEL LYMPH NODE BIOPSY Right 04/27/2018   Procedure: RIGHT BREAST LUMPECTOMY WITH RADIOACTIVE SEED X 2 AND RIGHT SENTINEL LYMPH NODE BIOPSY;  Surgeon: [name], MD;   Location: [facility];  Service: General;  Laterality: Right;  . CATARACT EXTRACTION W/PHACO Left 08/30/2019   Procedure: CATARACT EXTRACTION PHACO AND INTRAOCULAR LENS PLACEMENT ([place]) LEFT panoptix toric  01:20.5  12.7%  10.26;  Surgeon: Leandrew Koyanagi, MD;  Location: [facility];  Service: Ophthalmology;  Laterality: Left;  . CATARACT EXTRACTION W/PHACO Right 09/20/2019   Procedure: CATARACT EXTRACTION PHACO AND INTRAOCULAR LENS PLACEMENT ([facility]) RIGHT 5.71  00:36.4  15.7%;  Surgeon: Leandrew Koyanagi, MD;  Location: [facility];  Service: Ophthalmology;  Laterality: Right;  . LAPAROTOMY N/A 02/10/2018   Procedure: EXPLORATORY LAPAROTOMY;  Surgeon: Isabel Caprice, MD;  [facility];  Service: Gynecology;  Laterality: N/A;  . MASS EXCISION  01/2018   abdominal  . OVARIAN CYST REMOVAL    . SALPINGOOPHORECTOMY Bilateral 02/10/2018   Procedure: BILATERAL SALPINGO OOPHORECTOMY; PERITONEAL WASHINGS;  Surgeon: Isabel Caprice, MD;  [facility];  Service: Gynecology;  Laterality: Bilateral;  . TONSILLECTOMY    . TONSILLECTOMY AND ADENOIDECTOMY  1953    There were no vitals filed for this visit.   Subjective Assessment - 07/15/20 0907    Subjective Patient states no major changes since the previous session. States she's been walking 3 x 10 min for fitness, has to stop secondary to fatigue and dizziness.    Pertinent History Patient report  unclear regarding onset. Visited MD on Sept 16 and received injection with relief for approximately 1 month. Reports she cannot receive an additional injection pending eye surgery. Received dx from MD of hip bursitis in B hips. Reports pain with walking, stanindg, sitting 5/10 current 7/10 worst 3/10 best, with temporary relief when she is sitting "just right" and with naproxen. Agg: activity. Cormorbidities: hx of breast cancer, HTN, MCI, insomnia, cataracts    Patient Stated Goals Be able to move without hurting, walk 1  hour    Currently in Pain? No/denies           TREATMENT Neuromuscular reeducation Tandem stance ambulation -- 4 x 51ft Head turns with ambulation up/down, left/right -- 2 x 64ft amb Walking up 9" box -- x 3  Ambulation up/down stairs without UE support -- 2 x 4  Backwards ambulation -- x 28ft Eyes closed ambulation -- x 33ft Ambulation without standing/sitting breaks -- [phone] feet to fatigue  Performed to address balance issues    PT Education - 07/15/20 0908    Education provided Yes    Education Details form/technique with exercise    Person(s) Educated Patient    Methods Explanation;Demonstration    Comprehension Verbalized understanding;Returned demonstration            PT Short Term Goals - 09/12/19 1419      PT SHORT TERM GOAL #1   Title Patient will demonstrate complaince with HEP to speed recovery and reduce total number of visits.    Baseline HEP given    Time 2    Period Weeks    Status New    Target Date 09/27/19             PT Long Term Goals - 07/15/20 0927      PT LONG TERM GOAL #1   Title Patient will improve hip strength to 4/5 grossly to facilitate improved mobility with ADLs.    Baseline 3, 4-; 12/22 deferred to NV; 11/09/2019: 4-/5; 12/07/2019 L hip flexor 4, abd and ext 3+; 01/29/2020: 4/5 for most hip motions, 3+/5 for abd, ext; 03/20/2020: 4/5 for hip movements    Time 8    Period Weeks    Status Achieved      PT LONG TERM GOAL #2   Title Patient will demonstrate walking tolerance increased to 30 minutes without increase in symptoms to demonstrate improved ability to perform walking program for cardiovascular health.    Baseline 10 minute tolerance (patient report); 12/22 10 minutes (per pt report); 11/09/2019: 7 min; 12/07/2019 unable to assess due to icy streets;  01/29/2020: 10 min; 03/20/2020 10 min; 05/14/2020: 10 min x 3 per day; 06/11/2020: 38min x 3 per day; 07/15/2020: 66min x 3 per day    Time 8    Period Weeks    Status On-going      PT  LONG TERM GOAL #3   Title Patient will report worst pain of 3/10 during therapy to demonstrate reduced disability    Baseline Worst 7/10; 12/22 worst 9/10; 11/09/2019: 9/10; 12/07/2019 7/10; 01/29/2020: 7/10; 05/14/2020: 0/ 10    Time 8    Period Weeks    Status Achieved      PT LONG TERM GOAL #4   Title Patient will demonstrate ability to perform all bed mobility maneuvers without increase in pain for reduced disability with mobility and transfers with ADLs.    Baseline Reports pain with bed mobility supine <> sidelying L/R, sidelying to sitting EOB; 12/22 pt  able to perform I without increased pain    Time 8    Period Weeks    Status Achieved      PT LONG TERM GOAL #5   Title Patient will improve 10MW speed to 1.0 m/sec to meet norms for minimal fall risk and full community ambulation.    Baseline 0.88 m/sec; 12/22 0.96 m/sec; 11/09/2019: deferred; 12/07/2019 0.90 m/sec; 01/29/2020: .9  m/sec; 03/20/2020: .65m/s; 05/14/2020: . 9 m/s; 06/11/2020: .10m/s; 07/15/2020: 1 m/s    Time 8    Period Weeks    Status Achieved      PT LONG TERM GOAL #6   Title Patient will increase 6MWT by 200 ft to demonstrate improvement in functional capacity for transition to cardiac walking program    Baseline 6MWT = 1195; 1/7/20201: deferred; 12/07/2019 1095; 01/29/2020: deferred secondary to fatigue; 03/20/2020: 1054ft; 07/15/2020: 1161ft    Time 8    Period Weeks    Status On-going      PT LONG TERM GOAL #7   Title Patient will improve her 5xsts to under 15 sec to indicate singificnat improvement in LE functional strength and decrease in fall risk.    Baseline 24.5 sec; 03/20/2020: 19.19 sec; 06/13/2020: 14. 92    Time 6    Period Weeks    Status Achieved      PT LONG TERM GOAL #8   Title Patient will improve FGA to 28/30 to indicate improvement in dynamic balance and improvement in stability.    Baseline 23/30; 06/11/2020: 25/30; 07/15/2020: 26/30    Time 6    Period Weeks    Status On-going                  Plan - 07/15/20 1606    Clinical Impression Statement Patient is making progress towards long term goals with improvements in 58mWT, 78min walk test, and FGA indicating decreased balance difficulties overall. Although patient is improving she continues to demonstrate increased dizziness with walking for prolonged periods of time. Patient will benefit from further skilled therapy to return to prior level of function    Personal Factors and Comorbidities Age;Comorbidity 3+;Time since onset of injury/illness/exacerbation;Comorbidity 2    Comorbidities MCI, HTN, CA, cataracts    Examination-Activity Limitations Bend;Sit;Squat;Stairs;Stand;Sleep    Examination-Participation Restrictions Cleaning;Community Activity    Stability/Clinical Decision Making Evolving/Moderate complexity    Rehab Potential Fair    Clinical Impairments Affecting Rehab Potential MCI    PT Frequency 2x / week    PT Duration 6 weeks    PT Treatment/Interventions ADLs/Self Care Home Management;Therapeutic exercise;Patient/family education;Neuromuscular re-education;Therapeutic activities;Functional mobility training;Balance training;Manual techniques;Gait training;Stair training;Moist Heat;Electrical Stimulation;Cryotherapy;Taping;Dry needling;Energy conservation;Passive range of motion;Joint Manipulations    PT Next Visit Plan Progress glute therex, pelvic-hip dissociation; check and progress HEP with stretches    PT Home Exercise Plan lateral step ups, step ups, hip kickers ext/abd red theraband    Consulted and Agree with Plan of Care Patient           Patient will benefit from skilled therapeutic intervention in order to improve the following deficits and impairments:  Decreased range of motion, Postural dysfunction, Pain, Abnormal gait, Decreased activity tolerance, Decreased coordination, Decreased endurance, Decreased balance, Decreased mobility, Difficulty walking, Hypomobility, Increased muscle spasms, Impaired perceived  functional ability, Decreased strength, Impaired flexibility  Visit Diagnosis: Muscle weakness (generalized)  Difficulty in walking, not elsewhere classified     Problem List Patient Active Problem List   Diagnosis Date Noted  . Candidal skin  infection 04/29/2020  . Parkinson disease ([place]) 02/22/2020  . 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 Bipolar disorder, in full remission, most recent episode depressed ([place]) 11/08/2019  . Avitaminosis D 08/24/2019  . Insomnia due to mental condition 05/31/2019  . Bipolar I disorder, most recent episode depressed ([place]) 05/31/2019  . Mild cognitive disorder 05/31/2019  . Alcohol use disorder, moderate, in sustained remission ([place]) 05/31/2019  . Elevated tumor markers 05/27/2019  . GAD (generalized anxiety disorder) 04/10/2019  . Altered mental status 04/10/2019  . MDD (major depressive disorder), severe ([place]) 01/31/2019  . High serum carbohydrate antigen 19-9 (CA19-9) 12/01/2018  . Adjustment disorder with anxious mood 11/24/2018  . Essential hypertension 11/24/2018  . B12 deficiency 10/17/2018  . Incisional hernia, without obstruction or gangrene 09/12/2018  . Genetic testing 04/07/2018  . Family history of lung cancer 03/24/2018  . Malignant neoplasm of upper-outer quadrant of right breast in female, estrogen receptor positive ([place]) 03/17/2018  . Malignant neoplasm of upper-inner quadrant of right breast in female, estrogen receptor positive ([place]) 03/17/2018  . History of alcohol dependence ([place]) 02/07/2018  . History of psychosis 02/07/2018  . History of insomnia 02/07/2018    Blythe Stanford, PT DPT 07/15/2020, 4:38 PM  [facility] [address], [place], [address] Phone: [phone]   Fax:  [phone]  Name: Kendra [name] MRN: [id number] Date of Birth: [birth year]

## 2020-07-17 ENCOUNTER — Ambulatory Visit: Payer: Medicare PPO

## 2020-07-17 ENCOUNTER — Other Ambulatory Visit: Payer: Self-pay

## 2020-07-17 DIAGNOSIS — R262 Difficulty in walking, not elsewhere classified: Secondary | ICD-10-CM | POA: Diagnosis not present

## 2020-07-17 DIAGNOSIS — M6281 Muscle weakness (generalized): Secondary | ICD-10-CM | POA: Diagnosis not present

## 2020-07-17 NOTE — Therapy (Signed)
[facility] [address] [place], [address] Phone: [phone]   Fax:  [phone]  Physical Therapy Treatment  Patient Details  Name: Kendra [name] MRN: [id number] Date of Birth: 11/14/1945 No data recorded  Encounter Date: 07/17/2020   PT End of Session - 07/17/20 1454    Visit Number 54    Number of Visits 40    Date for PT Re-Evaluation 08/28/20    Authorization Type 1/10    Authorization Time Period until 06/12/2020    PT Start Time 1430    PT Stop Time 1515    PT Time Calculation (min) 45 min    Equipment Utilized During Treatment Gait belt    Activity Tolerance Patient tolerated treatment well;No increased pain    Behavior During Therapy WFL for tasks assessed/performed           Past Medical History:  Diagnosis Date  . Anxiety   . Breast cancer ([place]) 03/2018   right breast cancer at 11:00 and 1:00  . Bursitis    bilateral hips and knees  . Complication of anesthesia   . Depression   . Family history of breast cancer 03/24/2018  . Family history of lung cancer 03/24/2018  . History of alcohol dependence ([place]) 2007   no ETOH; resolved since 2007  . History of hiatal hernia   . History of psychosis 2014   due to sleep disturbance  . Hypertension   . Personal history of radiation therapy 06/2018-07/2018   right breast ca  . PONV (postoperative nausea and vomiting)     Past Surgical History:  Procedure Laterality Date  . BREAST BIOPSY Right 03/14/2018   11:00 DCIS and invasive ductal carcinoma  . BREAST BIOPSY Right 03/14/2018   1:00 Invasive ductal carcinoma  . BREAST LUMPECTOMY Right 04/27/2018   lumpectomy of 11 and 1:00 cancers, clear margins, negative LN  . BREAST LUMPECTOMY WITH RADIOACTIVE SEED AND SENTINEL LYMPH NODE BIOPSY Right 04/27/2018   Procedure: RIGHT BREAST LUMPECTOMY WITH RADIOACTIVE SEED X 2 AND RIGHT SENTINEL LYMPH NODE BIOPSY;  Surgeon: [name], MD;   Location: [facility];  Service: General;  Laterality: Right;  . CATARACT EXTRACTION W/PHACO Left 08/30/2019   Procedure: CATARACT EXTRACTION PHACO AND INTRAOCULAR LENS PLACEMENT ([place]) LEFT panoptix toric  01:20.5  12.7%  10.26;  Surgeon: Leandrew Koyanagi, MD;  Location: [facility];  Service: Ophthalmology;  Laterality: Left;  . CATARACT EXTRACTION W/PHACO Right 09/20/2019   Procedure: CATARACT EXTRACTION PHACO AND INTRAOCULAR LENS PLACEMENT ([facility]) RIGHT 5.71  00:36.4  15.7%;  Surgeon: Leandrew Koyanagi, MD;  Location: [facility];  Service: Ophthalmology;  Laterality: Right;  . LAPAROTOMY N/A 02/10/2018   Procedure: EXPLORATORY LAPAROTOMY;  Surgeon: Isabel Caprice, MD;  [facility];  Service: Gynecology;  Laterality: N/A;  . MASS EXCISION  01/2018   abdominal  . OVARIAN CYST REMOVAL    . SALPINGOOPHORECTOMY Bilateral 02/10/2018   Procedure: BILATERAL SALPINGO OOPHORECTOMY; PERITONEAL WASHINGS;  Surgeon: Isabel Caprice, MD;  [facility];  Service: Gynecology;  Laterality: Bilateral;  . TONSILLECTOMY    . TONSILLECTOMY AND ADENOIDECTOMY  1953    There were no vitals filed for this visit.   Subjective Assessment - 07/17/20 1451    Subjective Patient reports no major changes since the previous session. States she was able to catch herself when having a LOB the previous day.    Pertinent History Patient report unclear regarding onset. Visited  MD on Sept 16 and received injection with relief for approximately 1 month. Reports she cannot receive an additional injection pending eye surgery. Received dx from MD of hip bursitis in B hips. Reports pain with walking, stanindg, sitting 5/10 current 7/10 worst 3/10 best, with temporary relief when she is sitting "just right" and with naproxen. Agg: activity. Cormorbidities: hx of breast cancer, HTN, MCI, insomnia, cataracts    Patient Stated Goals Be able to move without hurting, walk 1 hour    Currently in  Pain? No/denies              TREATMENT Neuromuscular reeducation Seated reach out, down, up and back - x 10  Rotational step ups onto 6" step - x 10  Tandem stance forward/backward weight shifts with contralateral arm rasises - x 10 B Eyes closed ambulation --2 x 51ft Head turns with ambulation up/down, left/right -- 2 x 81ft amb EC Head turns with ambulation up/down, left/right -- 2 x 65ft amb  Performed to address balance issues     PT Education - 07/17/20 1453    Education provided Yes    Education Details form/technique with exercise    Person(s) Educated Patient    Methods Explanation;Demonstration    Comprehension Verbalized understanding;Returned demonstration            PT Short Term Goals - 09/12/19 1419      PT SHORT TERM GOAL #1   Title Patient will demonstrate complaince with HEP to speed recovery and reduce total number of visits.    Baseline HEP given    Time 2    Period Weeks    Status New    Target Date 09/27/19             PT Long Term Goals - 07/15/20 0927      PT LONG TERM GOAL #1   Title Patient will improve hip strength to 4/5 grossly to facilitate improved mobility with ADLs.    Baseline 3, 4-; 12/22 deferred to NV; 11/09/2019: 4-/5; 12/07/2019 L hip flexor 4, abd and ext 3+; 01/29/2020: 4/5 for most hip motions, 3+/5 for abd, ext; 03/20/2020: 4/5 for hip movements    Time 8    Period Weeks    Status Achieved      PT LONG TERM GOAL #2   Title Patient will demonstrate walking tolerance increased to 30 minutes without increase in symptoms to demonstrate improved ability to perform walking program for cardiovascular health.    Baseline 10 minute tolerance (patient report); 12/22 10 minutes (per pt report); 11/09/2019: 7 min; 12/07/2019 unable to assess due to icy streets;  01/29/2020: 10 min; 03/20/2020 10 min; 05/14/2020: 10 min x 3 per day; 06/11/2020: 27min x 3 per day; 07/15/2020: 75min x 3 per day    Time 8    Period Weeks    Status On-going       PT LONG TERM GOAL #3   Title Patient will report worst pain of 3/10 during therapy to demonstrate reduced disability    Baseline Worst 7/10; 12/22 worst 9/10; 11/09/2019: 9/10; 12/07/2019 7/10; 01/29/2020: 7/10; 05/14/2020: 0/ 10    Time 8    Period Weeks    Status Achieved      PT LONG TERM GOAL #4   Title Patient will demonstrate ability to perform all bed mobility maneuvers without increase in pain for reduced disability with mobility and transfers with ADLs.    Baseline Reports pain with bed mobility supine <> sidelying L/R,  sidelying to sitting EOB; 12/22 pt able to perform I without increased pain    Time 8    Period Weeks    Status Achieved      PT LONG TERM GOAL #5   Title Patient will improve 10MW speed to 1.0 m/sec to meet norms for minimal fall risk and full community ambulation.    Baseline 0.88 m/sec; 12/22 0.96 m/sec; 11/09/2019: deferred; 12/07/2019 0.90 m/sec; 01/29/2020: .9  m/sec; 03/20/2020: .27m/s; 05/14/2020: . 9 m/s; 06/11/2020: .45m/s; 07/15/2020: 1 m/s    Time 8    Period Weeks    Status Achieved      PT LONG TERM GOAL #6   Title Patient will increase 6MWT by 200 ft to demonstrate improvement in functional capacity for transition to cardiac walking program    Baseline 6MWT = 1195; 1/7/20201: deferred; 12/07/2019 1095; 01/29/2020: deferred secondary to fatigue; 03/20/2020: 1069ft; 07/15/2020: 1178ft    Time 8    Period Weeks    Status On-going      PT LONG TERM GOAL #7   Title Patient will improve her 5xsts to under 15 sec to indicate singificnat improvement in LE functional strength and decrease in fall risk.    Baseline 24.5 sec; 03/20/2020: 19.19 sec; 06/13/2020: 14. 92    Time 6    Period Weeks    Status Achieved      PT LONG TERM GOAL #8   Title Patient will improve FGA to 28/30 to indicate improvement in dynamic balance and improvement in stability.    Baseline 23/30; 06/11/2020: 25/30; 07/15/2020: 26/30    Time 6    Period Weeks    Status On-going                  Plan - 07/17/20 1530    Clinical Impression Statement Patient demonstrates increased difficulty with performing ambulation with head turns, EC indicating poor propioception and vestibular difficulty. Although patient has these difficulties, she demonstrates good carryover between large movements to address parkinson's dz propensities. Patient will benefit from further skilled therapy to return to prior level of function.    Personal Factors and Comorbidities Age;Comorbidity 3+;Time since onset of injury/illness/exacerbation;Comorbidity 2    Comorbidities MCI, HTN, CA, cataracts    Examination-Activity Limitations Bend;Sit;Squat;Stairs;Stand;Sleep    Examination-Participation Restrictions Cleaning;Community Activity    Stability/Clinical Decision Making Evolving/Moderate complexity    Rehab Potential Fair    Clinical Impairments Affecting Rehab Potential MCI    PT Frequency 2x / week    PT Duration 6 weeks    PT Treatment/Interventions ADLs/Self Care Home Management;Therapeutic exercise;Patient/family education;Neuromuscular re-education;Therapeutic activities;Functional mobility training;Balance training;Manual techniques;Gait training;Stair training;Moist Heat;Electrical Stimulation;Cryotherapy;Taping;Dry needling;Energy conservation;Passive range of motion;Joint Manipulations    PT Next Visit Plan Progress glute therex, pelvic-hip dissociation; check and progress HEP with stretches    PT Home Exercise Plan lateral step ups, step ups, hip kickers ext/abd red theraband    Consulted and Agree with Plan of Care Patient           Patient will benefit from skilled therapeutic intervention in order to improve the following deficits and impairments:  Decreased range of motion, Postural dysfunction, Pain, Abnormal gait, Decreased activity tolerance, Decreased coordination, Decreased endurance, Decreased balance, Decreased mobility, Difficulty walking, Hypomobility, Increased muscle  spasms, Impaired perceived functional ability, Decreased strength, Impaired flexibility  Visit Diagnosis: Muscle weakness (generalized)  Difficulty in walking, not elsewhere classified     Problem List Patient Active Problem List   Diagnosis Date Noted  . Candidal  skin infection 04/29/2020  . Parkinson disease ([place]) 02/22/2020  . 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 Bipolar disorder, in full remission, most recent episode depressed ([place]) 11/08/2019  . Avitaminosis D 08/24/2019  . Insomnia due to mental condition 05/31/2019  . Bipolar I disorder, most recent episode depressed ([place]) 05/31/2019  . Mild cognitive disorder 05/31/2019  . Alcohol use disorder, moderate, in sustained remission ([place]) 05/31/2019  . Elevated tumor markers 05/27/2019  . GAD (generalized anxiety disorder) 04/10/2019  . Altered mental status 04/10/2019  . MDD (major depressive disorder), severe ([place]) 01/31/2019  . High serum carbohydrate antigen 19-9 (CA19-9) 12/01/2018  . Adjustment disorder with anxious mood 11/24/2018  . Essential hypertension 11/24/2018  . B12 deficiency 10/17/2018  . Incisional hernia, without obstruction or gangrene 09/12/2018  . Genetic testing 04/07/2018  . Family history of lung cancer 03/24/2018  . Malignant neoplasm of upper-outer quadrant of right breast in female, estrogen receptor positive ([place]) 03/17/2018  . Malignant neoplasm of upper-inner quadrant of right breast in female, estrogen receptor positive ([place]) 03/17/2018  . History of alcohol dependence ([place]) 02/07/2018  . History of psychosis 02/07/2018  . History of insomnia 02/07/2018    Blythe Stanford, PT DPT 07/17/2020, 3:35 PM  [facility] [address] [place], [address] Phone: [phone]   Fax:  [phone]  Name: Kendra [name] MRN: [id number] Date of Birth: 02-Mar-1946

## 2020-07-22 ENCOUNTER — Ambulatory Visit: Payer: Medicare PPO

## 2020-07-22 ENCOUNTER — Other Ambulatory Visit: Payer: Self-pay

## 2020-07-22 DIAGNOSIS — R262 Difficulty in walking, not elsewhere classified: Secondary | ICD-10-CM | POA: Diagnosis not present

## 2020-07-22 DIAGNOSIS — M6281 Muscle weakness (generalized): Secondary | ICD-10-CM | POA: Diagnosis not present

## 2020-07-22 NOTE — Therapy (Signed)
[facility] [address] [place], [address] Phone: [phone]   Fax:  [phone]  Physical Therapy Treatment  Patient Details  Name: Kendra [name] MRN: [id number] Date of Birth: Aug 09, 1946 No data recorded  Encounter Date: 07/22/2020   PT End of Session - 07/22/20 1438    Visit Number 55    Number of Visits 52    Date for PT Re-Evaluation 08/28/20    Authorization Type 2/10    Authorization Time Period until 06/12/2020    PT Start Time 1435    PT Stop Time 1515    PT Time Calculation (min) 40 min    Equipment Utilized During Treatment Gait belt    Activity Tolerance Patient tolerated treatment well;No increased pain    Behavior During Therapy WFL for tasks assessed/performed           Past Medical History:  Diagnosis Date  . Anxiety   . Breast cancer ([facility]) 03/2018   right breast cancer at 11:00 and 1:00  . Bursitis    bilateral hips and knees  . Complication of anesthesia   . Depression   . Family history of breast cancer 03/24/2018  . Family history of lung cancer 03/24/2018  . History of alcohol dependence ([place]) 2007   no ETOH; resolved since 2007  . History of hiatal hernia   . History of psychosis 2014   due to sleep disturbance  . Hypertension   . Personal history of radiation therapy 06/2018-07/2018   right breast ca  . PONV (postoperative nausea and vomiting)     Past Surgical History:  Procedure Laterality Date  . BREAST BIOPSY Right 03/14/2018   11:00 DCIS and invasive ductal carcinoma  . BREAST BIOPSY Right 03/14/2018   1:00 Invasive ductal carcinoma  . BREAST LUMPECTOMY Right 04/27/2018   lumpectomy of 11 and 1:00 cancers, clear margins, negative LN  . BREAST LUMPECTOMY WITH RADIOACTIVE SEED AND SENTINEL LYMPH NODE BIOPSY Right 04/27/2018   Procedure: RIGHT BREAST LUMPECTOMY WITH RADIOACTIVE SEED X 2 AND RIGHT SENTINEL LYMPH NODE BIOPSY;  Surgeon: [name], MD;   Location: [facility];  Service: General;  Laterality: Right;  . CATARACT EXTRACTION W/PHACO Left 08/30/2019   Procedure: CATARACT EXTRACTION PHACO AND INTRAOCULAR LENS PLACEMENT ([place]) LEFT panoptix toric  01:20.5  12.7%  10.26;  Surgeon: Leandrew Koyanagi, MD;  Location: [facility];  Service: Ophthalmology;  Laterality: Left;  . CATARACT EXTRACTION W/PHACO Right 09/20/2019   Procedure: CATARACT EXTRACTION PHACO AND INTRAOCULAR LENS PLACEMENT ([facility]) RIGHT 5.71  00:36.4  15.7%;  Surgeon: Leandrew Koyanagi, MD;  Location: [facility];  Service: Ophthalmology;  Laterality: Right;  . LAPAROTOMY N/A 02/10/2018   Procedure: EXPLORATORY LAPAROTOMY;  Surgeon: Isabel Caprice, MD;  [facility];  Service: Gynecology;  Laterality: N/A;  . MASS EXCISION  01/2018   abdominal  . OVARIAN CYST REMOVAL    . SALPINGOOPHORECTOMY Bilateral 02/10/2018   Procedure: BILATERAL SALPINGO OOPHORECTOMY; PERITONEAL WASHINGS;  Surgeon: Isabel Caprice, MD;  [facility];  Service: Gynecology;  Laterality: Bilateral;  . TONSILLECTOMY    . TONSILLECTOMY AND ADENOIDECTOMY  1953    There were no vitals filed for this visit.   Subjective Assessment - 07/22/20 1436    Subjective Patient reports no issues at today's session.    Pertinent History Patient report unclear regarding onset. Visited MD on Sept 16 and received injection with relief for approximately 1 month. Reports she cannot  receive an additional injection pending eye surgery. Received dx from MD of hip bursitis in B hips. Reports pain with walking, stanindg, sitting 5/10 current 7/10 worst 3/10 best, with temporary relief when she is sitting "just right" and with naproxen. Agg: activity. Cormorbidities: hx of breast cancer, HTN, MCI, insomnia, cataracts    Limitations Sitting;Standing;Walking;House hold activities    How long can you sit comfortably? Pain constant, reduced with neutral pelvic tilt    How long can you stand  comfortably? Pain constant, increases with time    How long can you walk comfortably? Pain constant, increases with time    Patient Stated Goals Be able to move without hurting, walk 1 hour    Currently in Pain? No/denies    Pain Onset More than a month ago          TREATMENT  Neuromuscular reeducation Seated reach out, down, up and back - x 10  Eyes closed ambulation --4 x 26ft Head turns with ambulation up/down, left/right -- 4 x 12ft amb EC Head turns with ambulation up/down, left/right -- 4 x 25ft amb Walking Backwards - 4 x 46ft  Walking forwards with ball toss -- 4 x 54ft  Standing reaching out/stepping out with clap behind back x 20 B   Performed to address balance issues      PT Education - 07/22/20 1436    Education provided Yes    Education Details form/technique with exercise    Person(s) Educated Patient    Methods Explanation;Demonstration    Comprehension Verbalized understanding;Returned demonstration            PT Short Term Goals - 09/12/19 1419      PT SHORT TERM GOAL #1   Title Patient will demonstrate complaince with HEP to speed recovery and reduce total number of visits.    Baseline HEP given    Time 2    Period Weeks    Status New    Target Date 09/27/19             PT Long Term Goals - 07/15/20 0927      PT LONG TERM GOAL #1   Title Patient will improve hip strength to 4/5 grossly to facilitate improved mobility with ADLs.    Baseline 3, 4-; 12/22 deferred to NV; 11/09/2019: 4-/5; 12/07/2019 L hip flexor 4, abd and ext 3+; 01/29/2020: 4/5 for most hip motions, 3+/5 for abd, ext; 03/20/2020: 4/5 for hip movements    Time 8    Period Weeks    Status Achieved      PT LONG TERM GOAL #2   Title Patient will demonstrate walking tolerance increased to 30 minutes without increase in symptoms to demonstrate improved ability to perform walking program for cardiovascular health.    Baseline 10 minute tolerance (patient report); 12/22 10 minutes  (per pt report); 11/09/2019: 7 min; 12/07/2019 unable to assess due to icy streets;  01/29/2020: 10 min; 03/20/2020 10 min; 05/14/2020: 10 min x 3 per day; 06/11/2020: 97min x 3 per day; 07/15/2020: 42min x 3 per day    Time 8    Period Weeks    Status On-going      PT LONG TERM GOAL #3   Title Patient will report worst pain of 3/10 during therapy to demonstrate reduced disability    Baseline Worst 7/10; 12/22 worst 9/10; 11/09/2019: 9/10; 12/07/2019 7/10; 01/29/2020: 7/10; 05/14/2020: 0/ 10    Time 8    Period Weeks    Status Achieved  PT LONG TERM GOAL #4   Title Patient will demonstrate ability to perform all bed mobility maneuvers without increase in pain for reduced disability with mobility and transfers with ADLs.    Baseline Reports pain with bed mobility supine <> sidelying L/R, sidelying to sitting EOB; 12/22 pt able to perform I without increased pain    Time 8    Period Weeks    Status Achieved      PT LONG TERM GOAL #5   Title Patient will improve 10MW speed to 1.0 m/sec to meet norms for minimal fall risk and full community ambulation.    Baseline 0.88 m/sec; 12/22 0.96 m/sec; 11/09/2019: deferred; 12/07/2019 0.90 m/sec; 01/29/2020: .9  m/sec; 03/20/2020: .2m/s; 05/14/2020: . 9 m/s; 06/11/2020: .22m/s; 07/15/2020: 1 m/s    Time 8    Period Weeks    Status Achieved      PT LONG TERM GOAL #6   Title Patient will increase 6MWT by 200 ft to demonstrate improvement in functional capacity for transition to cardiac walking program    Baseline 6MWT = 1195; 1/7/20201: deferred; 12/07/2019 1095; 01/29/2020: deferred secondary to fatigue; 03/20/2020: 109ft; 07/15/2020: 1125ft    Time 8    Period Weeks    Status On-going      PT LONG TERM GOAL #7   Title Patient will improve her 5xsts to under 15 sec to indicate singificnat improvement in LE functional strength and decrease in fall risk.    Baseline 24.5 sec; 03/20/2020: 19.19 sec; 06/13/2020: 14. 92    Time 6    Period Weeks    Status Achieved      PT  LONG TERM GOAL #8   Title Patient will improve FGA to 28/30 to indicate improvement in dynamic balance and improvement in stability.    Baseline 23/30; 06/11/2020: 25/30; 07/15/2020: 26/30    Time 6    Period Weeks    Status On-going                 Plan - 07/22/20 1505    Clinical Impression Statement Continued to focus on addressing patient's balance during today's session at which she had difficulties with.  Patient continues to have trouble with maintaining a straight gait path while performing head turns which is exaggerated when having patient close her eyes during head turns.  However, patients gait improved when walking backwards.  Patient educated on techniques to reduce dizziness when symptoms arise.  Patient will continue to benefit from skilled therapy to return to prior level of function.    Personal Factors and Comorbidities Age;Comorbidity 3+;Time since onset of injury/illness/exacerbation;Comorbidity 2    Comorbidities MCI, HTN, CA, cataracts    Examination-Activity Limitations Bend;Sit;Squat;Stairs;Stand;Sleep    Examination-Participation Restrictions Cleaning;Community Activity    Stability/Clinical Decision Making Evolving/Moderate complexity    Clinical Decision Making Moderate    Rehab Potential Fair    Clinical Impairments Affecting Rehab Potential MCI    PT Frequency 2x / week    PT Duration 6 weeks    PT Treatment/Interventions ADLs/Self Care Home Management;Therapeutic exercise;Patient/family education;Neuromuscular re-education;Therapeutic activities;Functional mobility training;Balance training;Manual techniques;Gait training;Stair training;Moist Heat;Electrical Stimulation;Cryotherapy;Taping;Dry needling;Energy conservation;Passive range of motion;Joint Manipulations    PT Next Visit Plan Progress glute therex, pelvic-hip dissociation; check and progress HEP with stretches    PT Home Exercise Plan lateral step ups, step ups, hip kickers ext/abd red theraband     Consulted and Agree with Plan of Care Patient  Patient will benefit from skilled therapeutic intervention in order to improve the following deficits and impairments:  Decreased range of motion, Postural dysfunction, Pain, Abnormal gait, Decreased activity tolerance, Decreased coordination, Decreased endurance, Decreased balance, Decreased mobility, Difficulty walking, Hypomobility, Increased muscle spasms, Impaired perceived functional ability, Decreased strength, Impaired flexibility  Visit Diagnosis: Muscle weakness (generalized)  Difficulty in walking, not elsewhere classified     Problem List Patient Active Problem List   Diagnosis Date Noted  . Candidal skin infection 04/29/2020  . Parkinson disease ([place]) 02/22/2020  . 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 Bipolar disorder, in full remission, most recent episode depressed ([place]) 11/08/2019  . Avitaminosis D 08/24/2019  . Insomnia due to mental condition 05/31/2019  . Bipolar I disorder, most recent episode depressed ([place]) 05/31/2019  . Mild cognitive disorder 05/31/2019  . Alcohol use disorder, moderate, in sustained remission ([place]) 05/31/2019  . Elevated tumor markers 05/27/2019  . GAD (generalized anxiety disorder) 04/10/2019  . Altered mental status 04/10/2019  . MDD (major depressive disorder), severe ([place]) 01/31/2019  . High serum carbohydrate antigen 19-9 (CA19-9) 12/01/2018  . Adjustment disorder with anxious mood 11/24/2018  . Essential hypertension 11/24/2018  . B12 deficiency 10/17/2018  . Incisional hernia, without obstruction or gangrene 09/12/2018  . Genetic testing 04/07/2018  . Family history of lung cancer 03/24/2018  . Malignant neoplasm of upper-outer quadrant of right breast in female, estrogen receptor positive ([place]) 03/17/2018  . Malignant neoplasm of upper-inner quadrant of right breast in female, estrogen receptor positive ([place]) 03/17/2018  . History of alcohol dependence ([place]) 02/07/2018   . History of psychosis 02/07/2018  . History of insomnia 02/07/2018   3:15 PM, 07/22/20 Margarito Liner, SPT Student Physical Therapist [place]  [phone]  Margarito Liner 07/22/2020, 3:06 PM  [facility] [address] [place], [address] Phone: [phone]   Fax:  [phone]  Name: Lucilia Yanni MRN: [id number] Date of Birth: [birth year]

## 2020-07-24 ENCOUNTER — Other Ambulatory Visit: Payer: Self-pay

## 2020-07-24 ENCOUNTER — Ambulatory Visit: Payer: Medicare PPO

## 2020-07-24 DIAGNOSIS — R262 Difficulty in walking, not elsewhere classified: Secondary | ICD-10-CM

## 2020-07-24 DIAGNOSIS — M6281 Muscle weakness (generalized): Secondary | ICD-10-CM | POA: Diagnosis not present

## 2020-07-24 NOTE — Therapy (Signed)
[facility] [address] [place], [address] Phone: [phone]   Fax:  [phone]  Physical Therapy Treatment  Patient Details  Name: Kendra [name] MRN: [id number] Date of Birth: [birth year] No data recorded  Encounter Date: 07/24/2020   PT End of Session - 07/24/20 1738    Visit Number 56    Number of Visits 62    Date for PT Re-Evaluation 08/28/20    Authorization Type 3/10    Authorization Time Period until 06/12/2020    PT Start Time 1300    PT Stop Time 1345    PT Time Calculation (min) 45 min    Equipment Utilized During Treatment Gait belt    Activity Tolerance Patient tolerated treatment well;No increased pain    Behavior During Therapy WFL for tasks assessed/performed           Past Medical History:  Diagnosis Date  . Anxiety   . Breast cancer ([place]) 03/2018   right breast cancer at 11:00 and 1:00  . Bursitis    bilateral hips and knees  . Complication of anesthesia   . Depression   . Family history of breast cancer 03/24/2018  . Family history of lung cancer 03/24/2018  . History of alcohol dependence ([place]) 2007   no ETOH; resolved since 2007  . History of hiatal hernia   . History of psychosis 2014   due to sleep disturbance  . Hypertension   . Personal history of radiation therapy 06/2018-07/2018   right breast ca  . PONV (postoperative nausea and vomiting)     Past Surgical History:  Procedure Laterality Date  . BREAST BIOPSY Right 03/14/2018   11:00 DCIS and invasive ductal carcinoma  . BREAST BIOPSY Right 03/14/2018   1:00 Invasive ductal carcinoma  . BREAST LUMPECTOMY Right 04/27/2018   lumpectomy of 11 and 1:00 cancers, clear margins, negative LN  . BREAST LUMPECTOMY WITH RADIOACTIVE SEED AND SENTINEL LYMPH NODE BIOPSY Right 04/27/2018   Procedure: RIGHT BREAST LUMPECTOMY WITH RADIOACTIVE SEED X 2 AND RIGHT SENTINEL LYMPH NODE BIOPSY;  Surgeon: [name], MD;   Location: [facility];  Service: General;  Laterality: Right;  . CATARACT EXTRACTION W/PHACO Left 08/30/2019   Procedure: CATARACT EXTRACTION PHACO AND INTRAOCULAR LENS PLACEMENT ([place]) LEFT panoptix toric  01:20.5  12.7%  10.26;  Surgeon: Leandrew Koyanagi, MD;  Location: [facility];  Service: Ophthalmology;  Laterality: Left;  . CATARACT EXTRACTION W/PHACO Right 09/20/2019   Procedure: CATARACT EXTRACTION PHACO AND INTRAOCULAR LENS PLACEMENT ([facility]) RIGHT 5.71  00:36.4  15.7%;  Surgeon: Leandrew Koyanagi, MD;  Location: [facility];  Service: Ophthalmology;  Laterality: Right;  . LAPAROTOMY N/A 02/10/2018   Procedure: EXPLORATORY LAPAROTOMY;  Surgeon: Isabel Caprice, MD;  [facility];  Service: Gynecology;  Laterality: N/A;  . MASS EXCISION  01/2018   abdominal  . OVARIAN CYST REMOVAL    . SALPINGOOPHORECTOMY Bilateral 02/10/2018   Procedure: BILATERAL SALPINGO OOPHORECTOMY; PERITONEAL WASHINGS;  Surgeon: Isabel Caprice, MD;  [facility];  Service: Gynecology;  Laterality: Bilateral;  . TONSILLECTOMY    . TONSILLECTOMY AND ADENOIDECTOMY  1953    There were no vitals filed for this visit.   Subjective Assessment - 07/24/20 1308    Subjective Patient reports she had some intermittent pain last night when going to bed. No major changes otherwise.    Pertinent History Patient report unclear regarding onset. Visited MD on Sept 16 and received  injection with relief for approximately 1 month. Reports she cannot receive an additional injection pending eye surgery. Received dx from MD of hip bursitis in B hips. Reports pain with walking, stanindg, sitting 5/10 current 7/10 worst 3/10 best, with temporary relief when she is sitting "just right" and with naproxen. Agg: activity. Cormorbidities: hx of breast cancer, HTN, MCI, insomnia, cataracts    Limitations Sitting;Standing;Walking;House hold activities    How long can you sit comfortably? Pain constant,  reduced with neutral pelvic tilt    How long can you stand comfortably? Pain constant, increases with time    How long can you walk comfortably? Pain constant, increases with time    Patient Stated Goals Be able to move without hurting, walk 1 hour    Currently in Pain? No/denies    Pain Onset More than a month ago           TREATMENT   Neuromuscular reeducation Seated reach out, down, up and back - x 10  Head turns with ambulation up/down, left/right -- 4 x 67ft amb (2 x 76ft on left/right, VOR left/right 4 x 75ft) EC Head turns with ambulation up/down, left/right -- 4 x 81ft amb Walking Backwards - 4 x 61ft  Dorsiflexion standing on airex beam - x10 ball tosses Dorsiflexion Side stepping with toes on airex beam - x 3     Performed to address balance issues      PT Education - 07/24/20 1705    Education provided Yes    Education Details form/technique with exercise    Person(s) Educated Patient    Methods Explanation;Demonstration    Comprehension Verbalized understanding;Returned demonstration            PT Short Term Goals - 09/12/19 1419      PT SHORT TERM GOAL #1   Title Patient will demonstrate complaince with HEP to speed recovery and reduce total number of visits.    Baseline HEP given    Time 2    Period Weeks    Status New    Target Date 09/27/19             PT Long Term Goals - 07/15/20 0927      PT LONG TERM GOAL #1   Title Patient will improve hip strength to 4/5 grossly to facilitate improved mobility with ADLs.    Baseline 3, 4-; 12/22 deferred to NV; 11/09/2019: 4-/5; 12/07/2019 L hip flexor 4, abd and ext 3+; 01/29/2020: 4/5 for most hip motions, 3+/5 for abd, ext; 03/20/2020: 4/5 for hip movements    Time 8    Period Weeks    Status Achieved      PT LONG TERM GOAL #2   Title Patient will demonstrate walking tolerance increased to 30 minutes without increase in symptoms to demonstrate improved ability to perform walking program for  cardiovascular health.    Baseline 10 minute tolerance (patient report); 12/22 10 minutes (per pt report); 11/09/2019: 7 min; 12/07/2019 unable to assess due to icy streets;  01/29/2020: 10 min; 03/20/2020 10 min; 05/14/2020: 10 min x 3 per day; 06/11/2020: 30min x 3 per day; 07/15/2020: 79min x 3 per day    Time 8    Period Weeks    Status On-going      PT LONG TERM GOAL #3   Title Patient will report worst pain of 3/10 during therapy to demonstrate reduced disability    Baseline Worst 7/10; 12/22 worst 9/10; 11/09/2019: 9/10; 12/07/2019 7/10; 01/29/2020: 7/10; 05/14/2020:  0/ 10    Time 8    Period Weeks    Status Achieved      PT LONG TERM GOAL #4   Title Patient will demonstrate ability to perform all bed mobility maneuvers without increase in pain for reduced disability with mobility and transfers with ADLs.    Baseline Reports pain with bed mobility supine <> sidelying L/R, sidelying to sitting EOB; 12/22 pt able to perform I without increased pain    Time 8    Period Weeks    Status Achieved      PT LONG TERM GOAL #5   Title Patient will improve 10MW speed to 1.0 m/sec to meet norms for minimal fall risk and full community ambulation.    Baseline 0.88 m/sec; 12/22 0.96 m/sec; 11/09/2019: deferred; 12/07/2019 0.90 m/sec; 01/29/2020: .9  m/sec; 03/20/2020: .77m/s; 05/14/2020: . 9 m/s; 06/11/2020: .40m/s; 07/15/2020: 1 m/s    Time 8    Period Weeks    Status Achieved      PT LONG TERM GOAL #6   Title Patient will increase 6MWT by 200 ft to demonstrate improvement in functional capacity for transition to cardiac walking program    Baseline 6MWT = 1195; 1/7/20201: deferred; 12/07/2019 1095; 01/29/2020: deferred secondary to fatigue; 03/20/2020: 1044ft; 07/15/2020: 1135ft    Time 8    Period Weeks    Status On-going      PT LONG TERM GOAL #7   Title Patient will improve her 5xsts to under 15 sec to indicate singificnat improvement in LE functional strength and decrease in fall risk.    Baseline 24.5 sec;  03/20/2020: 19.19 sec; 06/13/2020: 14. 92    Time 6    Period Weeks    Status Achieved      PT LONG TERM GOAL #8   Title Patient will improve FGA to 28/30 to indicate improvement in dynamic balance and improvement in stability.    Baseline 23/30; 06/11/2020: 25/30; 07/15/2020: 26/30    Time 6    Period Weeks    Status On-going                 Plan - 07/24/20 1740    Clinical Impression Statement Continued to focus on improving balance most notably with walking backwards. Patient has greater liklihood of losing balance posteriorly. Patient is improving in this regard however continues to have increased difficulty with performing. Patient will benefit from further skilled therapy to return to prior level of function.    Personal Factors and Comorbidities Age;Comorbidity 3+;Time since onset of injury/illness/exacerbation;Comorbidity 2    Comorbidities MCI, HTN, CA, cataracts    Examination-Activity Limitations Bend;Sit;Squat;Stairs;Stand;Sleep    Examination-Participation Restrictions Cleaning;Community Activity    Stability/Clinical Decision Making Evolving/Moderate complexity    Rehab Potential Fair    Clinical Impairments Affecting Rehab Potential MCI    PT Frequency 2x / week    PT Duration 6 weeks    PT Treatment/Interventions ADLs/Self Care Home Management;Therapeutic exercise;Patient/family education;Neuromuscular re-education;Therapeutic activities;Functional mobility training;Balance training;Manual techniques;Gait training;Stair training;Moist Heat;Electrical Stimulation;Cryotherapy;Taping;Dry needling;Energy conservation;Passive range of motion;Joint Manipulations    PT Next Visit Plan Progress glute therex, pelvic-hip dissociation; check and progress HEP with stretches    PT Home Exercise Plan lateral step ups, step ups, hip kickers ext/abd red theraband    Consulted and Agree with Plan of Care Patient           Patient will benefit from skilled therapeutic intervention  in order to improve the following deficits and impairments:  Decreased range of motion, Postural dysfunction, Pain, Abnormal gait, Decreased activity tolerance, Decreased coordination, Decreased endurance, Decreased balance, Decreased mobility, Difficulty walking, Hypomobility, Increased muscle spasms, Impaired perceived functional ability, Decreased strength, Impaired flexibility  Visit Diagnosis: Muscle weakness (generalized)  Difficulty in walking, not elsewhere classified     Problem List Patient Active Problem List   Diagnosis Date Noted  . Candidal skin infection 04/29/2020  . Parkinson disease ([place]) 02/22/2020  . 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 Bipolar disorder, in full remission, most recent episode depressed ([place]) 11/08/2019  . Avitaminosis D 08/24/2019  . Insomnia due to mental condition 05/31/2019  . Bipolar I disorder, most recent episode depressed ([place]) 05/31/2019  . Mild cognitive disorder 05/31/2019  . Alcohol use disorder, moderate, in sustained remission ([place]) 05/31/2019  . Elevated tumor markers 05/27/2019  . GAD (generalized anxiety disorder) 04/10/2019  . Altered mental status 04/10/2019  . MDD (major depressive disorder), severe ([facility]) 01/31/2019  . High serum carbohydrate antigen 19-9 (CA19-9) 12/01/2018  . Adjustment disorder with anxious mood 11/24/2018  . Essential hypertension 11/24/2018  . B12 deficiency 10/17/2018  . Incisional hernia, without obstruction or gangrene 09/12/2018  . Genetic testing 04/07/2018  . Family history of lung cancer 03/24/2018  . Malignant neoplasm of upper-outer quadrant of right breast in female, estrogen receptor positive ([place]) 03/17/2018  . Malignant neoplasm of upper-inner quadrant of right breast in female, estrogen receptor positive ([place]) 03/17/2018  . History of alcohol dependence ([place]) 02/07/2018  . History of psychosis 02/07/2018  . History of insomnia 02/07/2018    Blythe Stanford, PT DPT 07/24/2020, 5:44  PM  [facility] [address] [place], [address] Phone: [phone]   Fax:  [phone]  Name: Kendra [name] MRN: [id number] Date of Birth: [birth year]

## 2020-07-29 ENCOUNTER — Ambulatory Visit: Payer: Medicare PPO

## 2020-07-29 ENCOUNTER — Other Ambulatory Visit: Payer: Self-pay

## 2020-07-29 DIAGNOSIS — R262 Difficulty in walking, not elsewhere classified: Secondary | ICD-10-CM

## 2020-07-29 DIAGNOSIS — M6281 Muscle weakness (generalized): Secondary | ICD-10-CM | POA: Diagnosis not present

## 2020-07-30 NOTE — Therapy (Signed)
[facility] [address] [place], [address] Phone: [phone]   Fax:  [phone]  Physical Therapy Treatment  Patient Details  Name: Kendra [name] MRN: [id number] Date of Birth: Mar 24, 1946 No data recorded  Encounter Date: 07/29/2020   PT End of Session - 07/29/20 1507    Visit Number 67    Number of Visits 62    Date for PT Re-Evaluation 08/28/20    Authorization Type 3/10    Authorization Time Period until 06/12/2020    PT Start Time 1430    PT Stop Time 1515    PT Time Calculation (min) 45 min    Equipment Utilized During Treatment Gait belt    Activity Tolerance Patient tolerated treatment well;No increased pain    Behavior During Therapy WFL for tasks assessed/performed           Past Medical History:  Diagnosis Date  . Anxiety   . Breast cancer ([facility]) 03/2018   right breast cancer at 11:00 and 1:00  . Bursitis    bilateral hips and knees  . Complication of anesthesia   . Depression   . Family history of breast cancer 03/24/2018  . Family history of lung cancer 03/24/2018  . History of alcohol dependence ([facility]) 2007   no ETOH; resolved since 2007  . History of hiatal hernia   . History of psychosis 2014   due to sleep disturbance  . Hypertension   . Personal history of radiation therapy 06/2018-07/2018   right breast ca  . PONV (postoperative nausea and vomiting)     Past Surgical History:  Procedure Laterality Date  . BREAST BIOPSY Right 03/14/2018   11:00 DCIS and invasive ductal carcinoma  . BREAST BIOPSY Right 03/14/2018   1:00 Invasive ductal carcinoma  . BREAST LUMPECTOMY Right 04/27/2018   lumpectomy of 11 and 1:00 cancers, clear margins, negative LN  . BREAST LUMPECTOMY WITH RADIOACTIVE SEED AND SENTINEL LYMPH NODE BIOPSY Right 04/27/2018   Procedure: RIGHT BREAST LUMPECTOMY WITH RADIOACTIVE SEED X 2 AND RIGHT SENTINEL LYMPH NODE BIOPSY;  Surgeon: [name], MD;   Location: [facility];  Service: General;  Laterality: Right;  . CATARACT EXTRACTION W/PHACO Left 08/30/2019   Procedure: CATARACT EXTRACTION PHACO AND INTRAOCULAR LENS PLACEMENT ([facility]) LEFT panoptix toric  01:20.5  12.7%  10.26;  Surgeon: Leandrew Koyanagi, MD;  Location: [facility];  Service: Ophthalmology;  Laterality: Left;  . CATARACT EXTRACTION W/PHACO Right 09/20/2019   Procedure: CATARACT EXTRACTION PHACO AND INTRAOCULAR LENS PLACEMENT ([facility]) RIGHT 5.71  00:36.4  15.7%;  Surgeon: Leandrew Koyanagi, MD;  Location: [facility];  Service: Ophthalmology;  Laterality: Right;  . LAPAROTOMY N/A 02/10/2018   Procedure: EXPLORATORY LAPAROTOMY;  Surgeon: Isabel Caprice, MD;  [facility];  Service: Gynecology;  Laterality: N/A;  . MASS EXCISION  01/2018   abdominal  . OVARIAN CYST REMOVAL    . SALPINGOOPHORECTOMY Bilateral 02/10/2018   Procedure: BILATERAL SALPINGO OOPHORECTOMY; PERITONEAL WASHINGS;  Surgeon: Isabel Caprice, MD;  [facility];  Service: Gynecology;  Laterality: Bilateral;  . TONSILLECTOMY    . TONSILLECTOMY AND ADENOIDECTOMY  1953    There were no vitals filed for this visit.   Subjective Assessment - 07/29/20 1440    Subjective Patient states she continues to feel dizziness where she feels like she is moving even when stopped.    Pertinent History Patient report unclear regarding onset. Visited MD on Sept 16 and received  injection with relief for approximately 1 month. Reports she cannot receive an additional injection pending eye surgery. Received dx from MD of hip bursitis in B hips. Reports pain with walking, stanindg, sitting 5/10 current 7/10 worst 3/10 best, with temporary relief when she is sitting "just right" and with naproxen. Agg: activity. Cormorbidities: hx of breast cancer, HTN, MCI, insomnia, cataracts    Limitations Sitting;Standing;Walking;House hold activities    How long can you sit comfortably? Pain constant,  reduced with neutral pelvic tilt    How long can you stand comfortably? Pain constant, increases with time    How long can you walk comfortably? Pain constant, increases with time    Patient Stated Goals Be able to move without hurting, walk 1 hour    Currently in Pain? No/denies    Pain Onset More than a month ago           TREATMENT Neuromuscular re-ed EC walk EC/ backward EO - 3 x 54ft EO walk forward ball toss/ EO backward walk ball toss - 3 x 32ft EC walk Head turns up/down; left/right - 3 x 85ft Large step backward walk with focus on improve speed - 3 x 3ft Seated reach out, down, forward, backward - 2 x 43ft Performed to improve balance and vestibular-based symptoms    PT Education - 07/29/20 1507    Education provided Yes    Education Details form/technique with exercise    Person(s) Educated Patient    Methods Explanation;Demonstration    Comprehension Verbalized understanding;Returned demonstration            PT Short Term Goals - 09/12/19 1419      PT SHORT TERM GOAL #1   Title Patient will demonstrate complaince with HEP to speed recovery and reduce total number of visits.    Baseline HEP given    Time 2    Period Weeks    Status New    Target Date 09/27/19             PT Long Term Goals - 07/15/20 0927      PT LONG TERM GOAL #1   Title Patient will improve hip strength to 4/5 grossly to facilitate improved mobility with ADLs.    Baseline 3, 4-; 12/22 deferred to NV; 11/09/2019: 4-/5; 12/07/2019 L hip flexor 4, abd and ext 3+; 01/29/2020: 4/5 for most hip motions, 3+/5 for abd, ext; 03/20/2020: 4/5 for hip movements    Time 8    Period Weeks    Status Achieved      PT LONG TERM GOAL #2   Title Patient will demonstrate walking tolerance increased to 30 minutes without increase in symptoms to demonstrate improved ability to perform walking program for cardiovascular health.    Baseline 10 minute tolerance (patient report); 12/22 10 minutes (per pt  report); 11/09/2019: 7 min; 12/07/2019 unable to assess due to icy streets;  01/29/2020: 10 min; 03/20/2020 10 min; 05/14/2020: 10 min x 3 per day; 06/11/2020: 72min x 3 per day; 07/15/2020: 5min x 3 per day    Time 8    Period Weeks    Status On-going      PT LONG TERM GOAL #3   Title Patient will report worst pain of 3/10 during therapy to demonstrate reduced disability    Baseline Worst 7/10; 12/22 worst 9/10; 11/09/2019: 9/10; 12/07/2019 7/10; 01/29/2020: 7/10; 05/14/2020: 0/ 10    Time 8    Period Weeks    Status Achieved  PT LONG TERM GOAL #4   Title Patient will demonstrate ability to perform all bed mobility maneuvers without increase in pain for reduced disability with mobility and transfers with ADLs.    Baseline Reports pain with bed mobility supine <> sidelying L/R, sidelying to sitting EOB; 12/22 pt able to perform I without increased pain    Time 8    Period Weeks    Status Achieved      PT LONG TERM GOAL #5   Title Patient will improve 10MW speed to 1.0 m/sec to meet norms for minimal fall risk and full community ambulation.    Baseline 0.88 m/sec; 12/22 0.96 m/sec; 11/09/2019: deferred; 12/07/2019 0.90 m/sec; 01/29/2020: .9  m/sec; 03/20/2020: .49m/s; 05/14/2020: . 9 m/s; 06/11/2020: .17m/s; 07/15/2020: 1 m/s    Time 8    Period Weeks    Status Achieved      PT LONG TERM GOAL #6   Title Patient will increase 6MWT by 200 ft to demonstrate improvement in functional capacity for transition to cardiac walking program    Baseline 6MWT = 1195; 1/7/20201: deferred; 12/07/2019 1095; 01/29/2020: deferred secondary to fatigue; 03/20/2020: 1075ft; 07/15/2020: 1176ft    Time 8    Period Weeks    Status On-going      PT LONG TERM GOAL #7   Title Patient will improve her 5xsts to under 15 sec to indicate singificnat improvement in LE functional strength and decrease in fall risk.    Baseline 24.5 sec; 03/20/2020: 19.19 sec; 06/13/2020: 14. 92    Time 6    Period Weeks    Status Achieved      PT LONG  TERM GOAL #8   Title Patient will improve FGA to 28/30 to indicate improvement in dynamic balance and improvement in stability.    Baseline 23/30; 06/11/2020: 25/30; 07/15/2020: 26/30    Time 6    Period Weeks    Status On-going                 Plan - 07/30/20 0954    Clinical Impression Statement Performed exercises to improve ability to balance with walking. Improvement with balance with backward walking, previously had instances of LOB requiring PT support to gain balance. Patient with overall improvement of symptoms, however she continues to experience symptoms. Patient will benefit from further skilled therapy to return to prior level of function.    Personal Factors and Comorbidities Age;Comorbidity 3+;Time since onset of injury/illness/exacerbation;Comorbidity 2    Comorbidities MCI, HTN, CA, cataracts    Examination-Activity Limitations Bend;Sit;Squat;Stairs;Stand;Sleep    Examination-Participation Restrictions Cleaning;Community Activity    Stability/Clinical Decision Making Evolving/Moderate complexity    Rehab Potential Fair    Clinical Impairments Affecting Rehab Potential MCI    PT Frequency 2x / week    PT Duration 6 weeks    PT Treatment/Interventions ADLs/Self Care Home Management;Therapeutic exercise;Patient/family education;Neuromuscular re-education;Therapeutic activities;Functional mobility training;Balance training;Manual techniques;Gait training;Stair training;Moist Heat;Electrical Stimulation;Cryotherapy;Taping;Dry needling;Energy conservation;Passive range of motion;Joint Manipulations    PT Next Visit Plan Progress glute therex, pelvic-hip dissociation; check and progress HEP with stretches    PT Home Exercise Plan lateral step ups, step ups, hip kickers ext/abd red theraband    Consulted and Agree with Plan of Care Patient           Patient will benefit from skilled therapeutic intervention in order to improve the following deficits and impairments:   Decreased range of motion, Postural dysfunction, Pain, Abnormal gait, Decreased activity tolerance, Decreased coordination, Decreased endurance, Decreased  balance, Decreased mobility, Difficulty walking, Hypomobility, Increased muscle spasms, Impaired perceived functional ability, Decreased strength, Impaired flexibility  Visit Diagnosis: Muscle weakness (generalized)  Difficulty in walking, not elsewhere classified     Problem List Patient Active Problem List   Diagnosis Date Noted  . Candidal skin infection 04/29/2020  . Parkinson disease ([place]) 02/22/2020  . 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 Bipolar disorder, in full remission, most recent episode depressed ([place]) 11/08/2019  . Avitaminosis D 08/24/2019  . Insomnia due to mental condition 05/31/2019  . Bipolar I disorder, most recent episode depressed ([place]) 05/31/2019  . Mild cognitive disorder 05/31/2019  . Alcohol use disorder, moderate, in sustained remission ([place]) 05/31/2019  . Elevated tumor markers 05/27/2019  . GAD (generalized anxiety disorder) 04/10/2019  . Altered mental status 04/10/2019  . MDD (major depressive disorder), severe ([place]) 01/31/2019  . High serum carbohydrate antigen 19-9 (CA19-9) 12/01/2018  . Adjustment disorder with anxious mood 11/24/2018  . Essential hypertension 11/24/2018  . B12 deficiency 10/17/2018  . Incisional hernia, without obstruction or gangrene 09/12/2018  . Genetic testing 04/07/2018  . Family history of lung cancer 03/24/2018  . Malignant neoplasm of upper-outer quadrant of right breast in female, estrogen receptor positive ([facility] [facility]) 03/17/2018  . Malignant neoplasm of upper-inner quadrant of right breast in female, estrogen receptor positive ([facility]) 03/17/2018  . History of alcohol dependence ([place]) 02/07/2018  . History of psychosis 02/07/2018  . History of insomnia 02/07/2018    Blythe Stanford, PT DPT 07/30/2020, 10:03 AM  [facility] [address], [place], [address] Phone: [phone]   Fax:  [phone]  Name: Kendra [name] MRN: [id number] Date of Birth: 1946-01-15

## 2020-07-31 ENCOUNTER — Ambulatory Visit: Payer: Medicare PPO

## 2020-07-31 ENCOUNTER — Other Ambulatory Visit: Payer: Self-pay

## 2020-07-31 DIAGNOSIS — M6281 Muscle weakness (generalized): Secondary | ICD-10-CM

## 2020-07-31 DIAGNOSIS — R262 Difficulty in walking, not elsewhere classified: Secondary | ICD-10-CM | POA: Diagnosis not present

## 2020-07-31 NOTE — Therapy (Signed)
[facility] [address] [place], [address] Phone: [phone]   Fax:  [phone]  Physical Therapy Treatment  Patient Details  Name: Kendra [name] MRN: [id number] Date of Birth: 08-Jun-1946 No data recorded  Encounter Date: 07/31/2020   PT End of Session - 07/31/20 1310    Visit Number 58    Number of Visits 62    Date for PT Re-Evaluation 08/28/20    Authorization Type 3/10    Authorization Time Period until 06/12/2020    PT Start Time 1300    PT Stop Time 1345    PT Time Calculation (min) 45 min    Equipment Utilized During Treatment Gait belt    Activity Tolerance Patient tolerated treatment well;No increased pain    Behavior During Therapy [facility] for tasks assessed/performed           Past Medical History:  Diagnosis Date   Anxiety    Breast cancer ([place]) 03/2018   right breast cancer at 11:00 and 1:00   Bursitis    bilateral hips and knees   Complication of anesthesia    Depression    Family history of breast cancer 03/24/2018   Family history of lung cancer 03/24/2018   History of alcohol dependence ([place]) 2007   no ETOH; resolved since 2007   History of hiatal hernia    History of psychosis 2014   due to sleep disturbance   Hypertension    Personal history of radiation therapy 06/2018-07/2018   right breast ca   PONV (postoperative nausea and vomiting)     Past Surgical History:  Procedure Laterality Date   BREAST BIOPSY Right 03/14/2018   11:00 DCIS and invasive ductal carcinoma   BREAST BIOPSY Right 03/14/2018   1:00 Invasive ductal carcinoma   BREAST LUMPECTOMY Right 04/27/2018   lumpectomy of 11 and 1:00 cancers, clear margins, negative LN   BREAST LUMPECTOMY WITH RADIOACTIVE SEED AND SENTINEL LYMPH NODE BIOPSY Right 04/27/2018   Procedure: RIGHT BREAST LUMPECTOMY WITH RADIOACTIVE SEED X 2 AND RIGHT SENTINEL LYMPH NODE BIOPSY;  Surgeon: [name], MD;   Location: [place];  Service: General;  Laterality: Right;   CATARACT EXTRACTION W/PHACO Left 08/30/2019   Procedure: CATARACT EXTRACTION PHACO AND INTRAOCULAR LENS PLACEMENT ([place]) LEFT panoptix toric  01:20.5  12.7%  10.26;  Surgeon: Leandrew Koyanagi, MD;  Location: [place];  Service: Ophthalmology;  Laterality: Left;   CATARACT EXTRACTION W/PHACO Right 09/20/2019   Procedure: CATARACT EXTRACTION PHACO AND INTRAOCULAR LENS PLACEMENT ([facility]) RIGHT 5.71  00:36.4  15.7%;  Surgeon: Leandrew Koyanagi, MD;  Location: [place] [facility];  Service: Ophthalmology;  Laterality: Right;   LAPAROTOMY N/A 02/10/2018   Procedure: EXPLORATORY LAPAROTOMY;  Surgeon: Isabel Caprice, MD;  Location: [facility];  Service: Gynecology;  Laterality: N/A;   MASS EXCISION  01/2018   abdominal   OVARIAN CYST REMOVAL     SALPINGOOPHORECTOMY Bilateral 02/10/2018   Procedure: BILATERAL SALPINGO OOPHORECTOMY; PERITONEAL WASHINGS;  Surgeon: Isabel Caprice, MD;  Location: [facility];  Service: Gynecology;  Laterality: Bilateral;   TONSILLECTOMY     TONSILLECTOMY AND ADENOIDECTOMY  1953    There were no vitals filed for this visit.   Subjective Assessment - 07/31/20 1306    Subjective Patient states she was able to walk for 20-25 min taking 2-3 standing breaks secondary to increased dizziness and fatigue.    Pertinent History Patient report unclear regarding onset. Visited MD on Sept  16 and received injection with relief for approximately 1 month. Reports she cannot receive an additional injection pending eye surgery. Received dx from MD of hip bursitis in B hips. Reports pain with walking, stanindg, sitting 5/10 current 7/10 worst 3/10 best, with temporary relief when she is sitting "just right" and with naproxen. Agg: activity. Cormorbidities: hx of breast cancer, HTN, MCI, insomnia, cataracts    Limitations Sitting;Standing;Walking;House hold activities    How long can you sit  comfortably? Pain constant, reduced with neutral pelvic tilt    How long can you stand comfortably? Pain constant, increases with time    How long can you walk comfortably? Pain constant, increases with time    Patient Stated Goals Be able to move without hurting, walk 1 hour    Currently in Pain? No/denies    Pain Onset More than a month ago               TREATMENT Neuromuscular re-ed EO walk forward ball toss - 4 x 32ft/ EO backward walk ball toss - 2 x 75ft EC walk / backward - 4 x 55ft EC walk Head turns left/right, up/down- 4 x 53ft Large step backward walk with focus on improve speed - 3 x 22ft Seated reach out, down, forward, backward - x 12 Step ups onto airex pad - 2 x 10 Marches on airex pad - x 12 with minimal UE support  Performed to improve balance and vestibular-based symptoms      PT Education - 07/31/20 1309    Education provided Yes    Education Details form/technique with exercise    Person(s) Educated Patient    Methods Explanation;Demonstration    Comprehension Verbalized understanding;Returned demonstration            PT Short Term Goals - 09/12/19 1419      PT SHORT TERM GOAL #1   Title Patient will demonstrate complaince with HEP to speed recovery and reduce total number of visits.    Baseline HEP given    Time 2    Period Weeks    Status New    Target Date 09/27/19             PT Long Term Goals - 07/15/20 0927      PT LONG TERM GOAL #1   Title Patient will improve hip strength to 4/5 grossly to facilitate improved mobility with ADLs.    Baseline 3, 4-; 12/22 deferred to NV; 11/09/2019: 4-/5; 12/07/2019 L hip flexor 4, abd and ext 3+; 01/29/2020: 4/5 for most hip motions, 3+/5 for abd, ext; 03/20/2020: 4/5 for hip movements    Time 8    Period Weeks    Status Achieved      PT LONG TERM GOAL #2   Title Patient will demonstrate walking tolerance increased to 30 minutes without increase in symptoms to demonstrate improved ability to perform  walking program for cardiovascular health.    Baseline 10 minute tolerance (patient report); 12/22 10 minutes (per pt report); 11/09/2019: 7 min; 12/07/2019 unable to assess due to icy streets;  01/29/2020: 10 min; 03/20/2020 10 min; 05/14/2020: 10 min x 3 per day; 06/11/2020: 13min x 3 per day; 07/15/2020: 62min x 3 per day    Time 8    Period Weeks    Status On-going      PT LONG TERM GOAL #3   Title Patient will report worst pain of 3/10 during therapy to demonstrate reduced disability    Baseline Worst 7/10;  12/22 worst 9/10; 11/09/2019: 9/10; 12/07/2019 7/10; 01/29/2020: 7/10; 05/14/2020: 0/ 10    Time 8    Period Weeks    Status Achieved      PT LONG TERM GOAL #4   Title Patient will demonstrate ability to perform all bed mobility maneuvers without increase in pain for reduced disability with mobility and transfers with ADLs.    Baseline Reports pain with bed mobility supine <> sidelying L/R, sidelying to sitting EOB; 12/22 pt able to perform I without increased pain    Time 8    Period Weeks    Status Achieved      PT LONG TERM GOAL #5   Title Patient will improve 10MW speed to 1.0 m/sec to meet norms for minimal fall risk and full community ambulation.    Baseline 0.88 m/sec; 12/22 0.96 m/sec; 11/09/2019: deferred; 12/07/2019 0.90 m/sec; 01/29/2020: .9  m/sec; 03/20/2020: .76m/s; 05/14/2020: . 9 m/s; 06/11/2020: .47m/s; 07/15/2020: 1 m/s    Time 8    Period Weeks    Status Achieved      PT LONG TERM GOAL #6   Title Patient will increase 6MWT by 200 ft to demonstrate improvement in functional capacity for transition to cardiac walking program    Baseline 6MWT = 1195; 1/7/20201: deferred; 12/07/2019 1095; 01/29/2020: deferred secondary to fatigue; 03/20/2020: 106ft; 07/15/2020: 1161ft    Time 8    Period Weeks    Status On-going      PT LONG TERM GOAL #7   Title Patient will improve her 5xsts to under 15 sec to indicate singificnat improvement in LE functional strength and decrease in fall risk.     Baseline 24.5 sec; 03/20/2020: 19.19 sec; 06/13/2020: 14. 92    Time 6    Period Weeks    Status Achieved      PT LONG TERM GOAL #8   Title Patient will improve FGA to 28/30 to indicate improvement in dynamic balance and improvement in stability.    Baseline 23/30; 06/11/2020: 25/30; 07/15/2020: 26/30    Time 6    Period Weeks    Status On-going                 Plan - 07/31/20 1320    Clinical Impression Statement Increased difficulty with performing dynamic balance activities, still requiring intermediate assistance to limit LOB. However patient demonstrates improvement with backwards walking with less overall need to correct balance once stopped. Patient will benefit from further skilled therapy focused on improving to return to prior level of function.    Personal Factors and Comorbidities Age;Comorbidity 3+;Time since onset of injury/illness/exacerbation;Comorbidity 2    Comorbidities MCI, HTN, CA, cataracts    Examination-Activity Limitations Bend;Sit;Squat;Stairs;Stand;Sleep    Examination-Participation Restrictions Cleaning;Community Activity    Stability/Clinical Decision Making Evolving/Moderate complexity    Rehab Potential Fair    Clinical Impairments Affecting Rehab Potential MCI    PT Frequency 2x / week    PT Duration 6 weeks    PT Treatment/Interventions ADLs/Self Care Home Management;Therapeutic exercise;Patient/family education;Neuromuscular re-education;Therapeutic activities;Functional mobility training;Balance training;Manual techniques;Gait training;Stair training;Moist Heat;Electrical Stimulation;Cryotherapy;Taping;Dry needling;Energy conservation;Passive range of motion;Joint Manipulations    PT Next Visit Plan Progress glute therex, pelvic-hip dissociation; check and progress HEP with stretches    PT Home Exercise Plan lateral step ups, step ups, hip kickers ext/abd red theraband    Consulted and Agree with Plan of Care Patient           Patient will  benefit from skilled therapeutic intervention  in order to improve the following deficits and impairments:  Decreased range of motion, Postural dysfunction, Pain, Abnormal gait, Decreased activity tolerance, Decreased coordination, Decreased endurance, Decreased balance, Decreased mobility, Difficulty walking, Hypomobility, Increased muscle spasms, Impaired perceived functional ability, Decreased strength, Impaired flexibility  Visit Diagnosis: Muscle weakness (generalized)  Difficulty in walking, not elsewhere classified     Problem List Patient Active Problem List   Diagnosis Date Noted   Candidal skin infection 04/29/2020   Parkinson disease ([place]) 02/22/2020   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   Bipolar disorder, in full remission, most recent episode depressed ([place]) 11/08/2019   Avitaminosis D 08/24/2019   Insomnia due to mental condition 05/31/2019   Bipolar I disorder, most recent episode depressed ([place]) 05/31/2019   Mild cognitive disorder 05/31/2019   Alcohol use disorder, moderate, in sustained remission ([place]) 05/31/2019   Elevated tumor markers 05/27/2019   GAD (generalized anxiety disorder) 04/10/2019   Altered mental status 04/10/2019   MDD (major depressive disorder), severe ([name]) 01/31/2019   High serum carbohydrate antigen 19-9 (CA19-9) 12/01/2018   Adjustment disorder with anxious mood 11/24/2018   Essential hypertension 11/24/2018   B12 deficiency 10/17/2018   Incisional hernia, without obstruction or gangrene 09/12/2018   Genetic testing 04/07/2018   Family history of lung cancer 03/24/2018   Malignant neoplasm of upper-outer quadrant of right breast in female, estrogen receptor positive ([place]) 03/17/2018   Malignant neoplasm of upper-inner quadrant of right breast in female, estrogen receptor positive ([place]) 03/17/2018   History of alcohol dependence ([place]) 02/07/2018   History of psychosis 02/07/2018   History of insomnia 02/07/2018     Blythe Stanford, PT DPT  07/31/2020, 1:33 PM  [facility] [address] [place], [address] Phone: [phone]   Fax:  [phone]  Name: Jakerra Floyd MRN: [id number] Date of Birth: 1946-01-09

## 2020-08-01 ENCOUNTER — Ambulatory Visit (INDEPENDENT_AMBULATORY_CARE_PROVIDER_SITE_OTHER): Payer: Medicare PPO | Admitting: Licensed Clinical Social Worker

## 2020-08-01 DIAGNOSIS — F3176 Bipolar disorder, in full remission, most recent episode depressed: Secondary | ICD-10-CM | POA: Diagnosis not present

## 2020-08-01 NOTE — Progress Notes (Addendum)
Virtual Visit via Telephone Note  I connected with Kendra [name] on 08/01/20 at  1:30 PM EDT by telephone and verified that I am speaking with the correct person using two identifiers.  Location: Patient: home  Provider: [facility]   I discussed the limitations, risks, security and privacy concerns of performing an evaluation and management service by telephone and the availability of in person appointments. I also discussed with the patient that there may be a patient responsible charge related to this service. The patient expressed understanding and agreed to proceed.   The patient was advised to call back or seek an in-person evaluation if the symptoms worsen or if the condition fails to improve as anticipated.  I provided 45 minutes of non-face-to-face time during this encounter.   Kortni Hasten R Rino Hosea, LCSW   THERAPIST PROGRESS NOTE  Session Time: 1:30-2:15p  Participation Level: Active  Behavioral Response: NAAlertpleasant;polite  Type of Therapy: Individual Therapy  Treatment Goals addressed: Coping  Interventions: Supportive  Summary: Kendra [name] is a 74 y.o. female who presents with  Improving symptoms related to diagnosis (bipolar disorder). Pt reports stable mood and feels that she is managing stress/anxiety well. Pt reports that she is compliant with medication, good quality/quantity of sleep, and is engaging in positive activities regularly.  Allowed pt to discuss thoughts and feelings associated with parkinson's diagnosis and overall psychological impact. Pt is adapting very well and making choices to improve overall quality of life on a daily basis. Pt paints, spends time with husband, and makes time to spend with friends weekly. Praised pts self-care.  Encouraged pts continued self care, focus on overall wellness, and life balance.   Suicidal/Homicidal: No  Therapist Response: Zarin report a decrease in symptoms since last session, which is indicative of  continuing progress.   Plan: Return again in 4 weeks. The ongoing treatment plan includes maintaining current levels of progress and continuing to build skills to manage mood, improve stress/anxiety management, encourage cognitively stimulating activities and behavior modification.   Diagnosis: Axis I: Bipolar, Depressed    Axis II: No diagnosis    [place], LCSW 08/01/2020

## 2020-08-05 ENCOUNTER — Ambulatory Visit: Payer: Medicare PPO | Attending: Family Medicine

## 2020-08-05 DIAGNOSIS — R262 Difficulty in walking, not elsewhere classified: Secondary | ICD-10-CM | POA: Insufficient documentation

## 2020-08-05 DIAGNOSIS — M6281 Muscle weakness (generalized): Secondary | ICD-10-CM | POA: Insufficient documentation

## 2020-08-07 ENCOUNTER — Ambulatory Visit: Payer: Medicare PPO

## 2020-08-07 ENCOUNTER — Other Ambulatory Visit: Payer: Self-pay

## 2020-08-07 DIAGNOSIS — R262 Difficulty in walking, not elsewhere classified: Secondary | ICD-10-CM

## 2020-08-07 DIAGNOSIS — M6281 Muscle weakness (generalized): Secondary | ICD-10-CM | POA: Diagnosis not present

## 2020-08-07 NOTE — Therapy (Signed)
[facility] [address], [place], [address] Phone: [phone]   Fax:  [phone]  Physical Therapy Treatment  Patient Details  Name: Kendra [name] MRN: [id number] Date of Birth: 04/21/1946 No data recorded  Encounter Date: 08/07/2020   PT End of Session - 08/07/20 1322    Visit Number 59    Number of Visits 62    Date for PT Re-Evaluation 08/28/20    PT Start Time 1300    PT Stop Time 1345    PT Time Calculation (min) 45 min    Equipment Utilized During Treatment Gait belt    Activity Tolerance Patient tolerated treatment well;No increased pain    Behavior During Therapy WFL for tasks assessed/performed           Past Medical History:  Diagnosis Date  . Anxiety   . Breast cancer ([place]) 03/2018   right breast cancer at 11:00 and 1:00  . Bursitis    bilateral hips and knees  . Complication of anesthesia   . Depression   . Family history of breast cancer 03/24/2018  . Family history of lung cancer 03/24/2018  . History of alcohol dependence ([place]) 2007   no ETOH; resolved since 2007  . History of hiatal hernia   . History of psychosis 2014   due to sleep disturbance  . Hypertension   . Personal history of radiation therapy 06/2018-07/2018   right breast ca  . PONV (postoperative nausea and vomiting)     Past Surgical History:  Procedure Laterality Date  . BREAST BIOPSY Right 03/14/2018   11:00 DCIS and invasive ductal carcinoma  . BREAST BIOPSY Right 03/14/2018   1:00 Invasive ductal carcinoma  . BREAST LUMPECTOMY Right 04/27/2018   lumpectomy of 11 and 1:00 cancers, clear margins, negative LN  . BREAST LUMPECTOMY WITH RADIOACTIVE SEED AND SENTINEL LYMPH NODE BIOPSY Right 04/27/2018   Procedure: RIGHT BREAST LUMPECTOMY WITH RADIOACTIVE SEED X 2 AND RIGHT SENTINEL LYMPH NODE BIOPSY;  Surgeon: [name], MD;  Location: [facility];  Service: General;  Laterality: Right;   . CATARACT EXTRACTION W/PHACO Left 08/30/2019   Procedure: CATARACT EXTRACTION PHACO AND INTRAOCULAR LENS PLACEMENT ([place]) LEFT panoptix toric  01:20.5  12.7%  10.26;  Surgeon: Leandrew Koyanagi, MD;  Location: [facility];  Service: Ophthalmology;  Laterality: Left;  . CATARACT EXTRACTION W/PHACO Right 09/20/2019   Procedure: CATARACT EXTRACTION PHACO AND INTRAOCULAR LENS PLACEMENT ([facility]) RIGHT 5.71  00:36.4  15.7%;  Surgeon: Leandrew Koyanagi, MD;  Location: [facility];  Service: Ophthalmology;  Laterality: Right;  . LAPAROTOMY N/A 02/10/2018   Procedure: EXPLORATORY LAPAROTOMY;  Surgeon: Isabel Caprice, MD;  [facility];  Service: Gynecology;  Laterality: N/A;  . MASS EXCISION  01/2018   abdominal  . OVARIAN CYST REMOVAL    . SALPINGOOPHORECTOMY Bilateral 02/10/2018   Procedure: BILATERAL SALPINGO OOPHORECTOMY; PERITONEAL WASHINGS;  Surgeon: Isabel Caprice, MD;  [facility];  Service: Gynecology;  Laterality: Bilateral;  . TONSILLECTOMY    . TONSILLECTOMY AND ADENOIDECTOMY  1953    There were no vitals filed for this visit.   Subjective Assessment - 08/07/20 1309    Subjective Patient states she has been feeling tired recently, reports she slept until 1030am today which she has never done. No major changes noted otherwise    Pertinent History Patient report unclear regarding onset. Visited MD on Sept 16 and received injection with relief for approximately 1 month.  Reports she cannot receive an additional injection pending eye surgery. Received dx from MD of hip bursitis in B hips. Reports pain with walking, stanindg, sitting 5/10 current 7/10 worst 3/10 best, with temporary relief when she is sitting "just right" and with naproxen. Agg: activity. Cormorbidities: hx of breast cancer, HTN, MCI, insomnia, cataracts    Limitations Sitting;Standing;Walking;House hold activities    How long can you sit comfortably? Pain constant, reduced with neutral pelvic tilt     How long can you stand comfortably? Pain constant, increases with time    How long can you walk comfortably? Pain constant, increases with time    Patient Stated Goals Be able to move without hurting, walk 1 hour    Currently in Pain? No/denies    Pain Onset More than a month ago           TREATMENT Neuromuscular re-ed EC walk Head turns left/right, up/down- 4 x 39ft Walking busy background - left/right - 4 x 23ft Walking busy background forward - 2 x30ft  Seated reach out, down, forward, backward - x 12 Large step backward walk with focus on improve speed - 4 x 28ft Walking backward looking for cones calling out colors - 2 x 43ft Ball toss with forward/backward rocking in semi tandem stance - 12 B  Performed to improve balance and vestibular-based symptoms      PT Education - 08/07/20 1321    Education provided Yes    Education Details form/technique with exercise    Person(s) Educated Patient    Methods Demonstration;Explanation    Comprehension Verbalized understanding;Returned demonstration            PT Short Term Goals - 09/12/19 1419      PT SHORT TERM GOAL #1   Title Patient will demonstrate complaince with HEP to speed recovery and reduce total number of visits.    Baseline HEP given    Time 2    Period Weeks    Status New    Target Date 09/27/19             PT Long Term Goals - 07/15/20 0927      PT LONG TERM GOAL #1   Title Patient will improve hip strength to 4/5 grossly to facilitate improved mobility with ADLs.    Baseline 3, 4-; 12/22 deferred to NV; 11/09/2019: 4-/5; 12/07/2019 L hip flexor 4, abd and ext 3+; 01/29/2020: 4/5 for most hip motions, 3+/5 for abd, ext; 03/20/2020: 4/5 for hip movements    Time 8    Period Weeks    Status Achieved      PT LONG TERM GOAL #2   Title Patient will demonstrate walking tolerance increased to 30 minutes without increase in symptoms to demonstrate improved ability to perform walking program for  cardiovascular health.    Baseline 10 minute tolerance (patient report); 12/22 10 minutes (per pt report); 11/09/2019: 7 min; 12/07/2019 unable to assess due to icy streets;  01/29/2020: 10 min; 03/20/2020 10 min; 05/14/2020: 10 min x 3 per day; 06/11/2020: 32min x 3 per day; 07/15/2020: 73min x 3 per day    Time 8    Period Weeks    Status On-going      PT LONG TERM GOAL #3   Title Patient will report worst pain of 3/10 during therapy to demonstrate reduced disability    Baseline Worst 7/10; 12/22 worst 9/10; 11/09/2019: 9/10; 12/07/2019 7/10; 01/29/2020: 7/10; 05/14/2020: 0/ 10    Time 8  Period Weeks    Status Achieved      PT LONG TERM GOAL #4   Title Patient will demonstrate ability to perform all bed mobility maneuvers without increase in pain for reduced disability with mobility and transfers with ADLs.    Baseline Reports pain with bed mobility supine <> sidelying L/R, sidelying to sitting EOB; 12/22 pt able to perform I without increased pain    Time 8    Period Weeks    Status Achieved      PT LONG TERM GOAL #5   Title Patient will improve 10MW speed to 1.0 m/sec to meet norms for minimal fall risk and full community ambulation.    Baseline 0.88 m/sec; 12/22 0.96 m/sec; 11/09/2019: deferred; 12/07/2019 0.90 m/sec; 01/29/2020: .9  m/sec; 03/20/2020: .86m/s; 05/14/2020: . 9 m/s; 06/11/2020: .34m/s; 07/15/2020: 1 m/s    Time 8    Period Weeks    Status Achieved      PT LONG TERM GOAL #6   Title Patient will increase 6MWT by 200 ft to demonstrate improvement in functional capacity for transition to cardiac walking program    Baseline 6MWT = 1195; 1/7/20201: deferred; 12/07/2019 1095; 01/29/2020: deferred secondary to fatigue; 03/20/2020: 1014ft; 07/15/2020: 1164ft    Time 8    Period Weeks    Status On-going      PT LONG TERM GOAL #7   Title Patient will improve her 5xsts to under 15 sec to indicate singificnat improvement in LE functional strength and decrease in fall risk.    Baseline 24.5 sec;  03/20/2020: 19.19 sec; 06/13/2020: 14. 92    Time 6    Period Weeks    Status Achieved      PT LONG TERM GOAL #8   Title Patient will improve FGA to 28/30 to indicate improvement in dynamic balance and improvement in stability.    Baseline 23/30; 06/11/2020: 25/30; 07/15/2020: 26/30    Time 6    Period Weeks    Status On-going                 Plan - 08/07/20 1342    Clinical Impression Statement Performed exercises to address balance concerns, decreased balance with backward walking, most notably indicated with feeling of "moving backward". Patient continues to requiring supervision with intermittent modA to perform exercises without LOB. Patient will benefit from further skilled therapy to return to prior level of function.    Personal Factors and Comorbidities Age;Comorbidity 3+;Time since onset of injury/illness/exacerbation;Comorbidity 2    Comorbidities MCI, HTN, CA, cataracts    Examination-Activity Limitations Bend;Sit;Squat;Stairs;Stand;Sleep    Examination-Participation Restrictions Cleaning;Community Activity    Stability/Clinical Decision Making Evolving/Moderate complexity    Rehab Potential Fair    Clinical Impairments Affecting Rehab Potential MCI    PT Frequency 2x / week    PT Duration 6 weeks    PT Treatment/Interventions ADLs/Self Care Home Management;Therapeutic exercise;Patient/family education;Neuromuscular re-education;Therapeutic activities;Functional mobility training;Balance training;Manual techniques;Gait training;Stair training;Moist Heat;Electrical Stimulation;Cryotherapy;Taping;Dry needling;Energy conservation;Passive range of motion;Joint Manipulations    PT Next Visit Plan Progress glute therex, pelvic-hip dissociation; check and progress HEP with stretches    PT Home Exercise Plan lateral step ups, step ups, hip kickers ext/abd red theraband    Consulted and Agree with Plan of Care Patient           Patient will benefit from skilled therapeutic  intervention in order to improve the following deficits and impairments:  Decreased range of motion, Postural dysfunction, Pain, Abnormal gait, Decreased activity  tolerance, Decreased coordination, Decreased endurance, Decreased balance, Decreased mobility, Difficulty walking, Hypomobility, Increased muscle spasms, Impaired perceived functional ability, Decreased strength, Impaired flexibility  Visit Diagnosis: Muscle weakness (generalized)  Difficulty in walking, not elsewhere classified     Problem List Patient Active Problem List   Diagnosis Date Noted  . Candidal skin infection 04/29/2020  . Parkinson disease ([facility]) 02/22/2020  . 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 Bipolar disorder, in full remission, most recent episode depressed ([facility]) 11/08/2019  . Avitaminosis D 08/24/2019  . Insomnia due to mental condition 05/31/2019  . Bipolar I disorder, most recent episode depressed ([facility]) 05/31/2019  . Mild cognitive disorder 05/31/2019  . Alcohol use disorder, moderate, in sustained remission ([place]) 05/31/2019  . Elevated tumor markers 05/27/2019  . GAD (generalized anxiety disorder) 04/10/2019  . Altered mental status 04/10/2019  . MDD (major depressive disorder), severe ([facility]) 01/31/2019  . High serum carbohydrate antigen 19-9 (CA19-9) 12/01/2018  . Adjustment disorder with anxious mood 11/24/2018  . Essential hypertension 11/24/2018  . B12 deficiency 10/17/2018  . Incisional hernia, without obstruction or gangrene 09/12/2018  . Genetic testing 04/07/2018  . Family history of lung cancer 03/24/2018  . Malignant neoplasm of upper-outer quadrant of right breast in female, estrogen receptor positive ([facility]) 03/17/2018  . Malignant neoplasm of upper-inner quadrant of right breast in female, estrogen receptor positive ([facility]) 03/17/2018  . History of alcohol dependence ([facility]) 02/07/2018  . History of psychosis 02/07/2018  . History of insomnia 02/07/2018    Blythe Stanford, PT DPT 08/07/2020,  1:48 PM  [facility] [address] [place], [address] Phone: [phone]   Fax:  [phone]  Name: Viviann Broyles MRN: [id number] Date of Birth: [birth year]

## 2020-08-12 ENCOUNTER — Other Ambulatory Visit: Payer: Self-pay

## 2020-08-12 ENCOUNTER — Ambulatory Visit: Payer: Medicare PPO

## 2020-08-12 DIAGNOSIS — R262 Difficulty in walking, not elsewhere classified: Secondary | ICD-10-CM

## 2020-08-12 DIAGNOSIS — M6281 Muscle weakness (generalized): Secondary | ICD-10-CM

## 2020-08-12 NOTE — Therapy (Signed)
[facility] [address], [place], [address] Phone: [phone]   Fax:  [phone]  Physical Therapy Treatment  Patient Details  Name: Kendra [name] MRN: [id number] Date of Birth: [birth year] No data recorded  Encounter Date: 08/12/2020   PT End of Session - 08/12/20 1439    Visit Number 60    Number of Visits 62    Date for PT Re-Evaluation 08/28/20    PT Start Time 6962    PT Stop Time 1430    PT Time Calculation (min) 45 min    Equipment Utilized During Treatment Gait belt    Activity Tolerance Patient tolerated treatment well;No increased pain    Behavior During Therapy WFL for tasks assessed/performed           Past Medical History:  Diagnosis Date  . Anxiety   . Breast cancer ([facility]) 03/2018   right breast cancer at 11:00 and 1:00  . Bursitis    bilateral hips and knees  . Complication of anesthesia   . Depression   . Family history of breast cancer 03/24/2018  . Family history of lung cancer 03/24/2018  . History of alcohol dependence ([facility]) 2007   no ETOH; resolved since 2007  . History of hiatal hernia   . History of psychosis 2014   due to sleep disturbance  . Hypertension   . Personal history of radiation therapy 06/2018-07/2018   right breast ca  . PONV (postoperative nausea and vomiting)     Past Surgical History:  Procedure Laterality Date  . BREAST BIOPSY Right 03/14/2018   11:00 DCIS and invasive ductal carcinoma  . BREAST BIOPSY Right 03/14/2018   1:00 Invasive ductal carcinoma  . BREAST LUMPECTOMY Right 04/27/2018   lumpectomy of 11 and 1:00 cancers, clear margins, negative LN  . BREAST LUMPECTOMY WITH RADIOACTIVE SEED AND SENTINEL LYMPH NODE BIOPSY Right 04/27/2018   Procedure: RIGHT BREAST LUMPECTOMY WITH RADIOACTIVE SEED X 2 AND RIGHT SENTINEL LYMPH NODE BIOPSY;  Surgeon: [name], MD;  Location: [facility];  Service: General;  Laterality: Right;   . CATARACT EXTRACTION W/PHACO Left 08/30/2019   Procedure: CATARACT EXTRACTION PHACO AND INTRAOCULAR LENS PLACEMENT ([facility]) LEFT panoptix toric  01:20.5  12.7%  10.26;  Surgeon: Leandrew Koyanagi, MD;  Location: [facility];  Service: Ophthalmology;  Laterality: Left;  . CATARACT EXTRACTION W/PHACO Right 09/20/2019   Procedure: CATARACT EXTRACTION PHACO AND INTRAOCULAR LENS PLACEMENT ([facility]) RIGHT 5.71  00:36.4  15.7%;  Surgeon: Leandrew Koyanagi, MD;  Location: [facility];  Service: Ophthalmology;  Laterality: Right;  . LAPAROTOMY N/A 02/10/2018   Procedure: EXPLORATORY LAPAROTOMY;  Surgeon: Isabel Caprice, MD;  [facility];  Service: Gynecology;  Laterality: N/A;  . MASS EXCISION  01/2018   abdominal  . OVARIAN CYST REMOVAL    . SALPINGOOPHORECTOMY Bilateral 02/10/2018   Procedure: BILATERAL SALPINGO OOPHORECTOMY; PERITONEAL WASHINGS;  Surgeon: Isabel Caprice, MD;  [facility];  Service: Gynecology;  Laterality: Bilateral;  . TONSILLECTOMY    . TONSILLECTOMY AND ADENOIDECTOMY  1953    There were no vitals filed for this visit.   Subjective Assessment - 08/12/20 1352    Subjective Patient states increase in dizziness symptoms with performing standing activities 30 minutes after her appointment last session. Patient states she had symptoms for 20 minutes, no other episodes noted.    Pertinent History Patient report unclear regarding onset. Visited MD on Sept 16 and received injection with  relief for approximately 1 month. Reports she cannot receive an additional injection pending eye surgery. Received dx from MD of hip bursitis in B hips. Reports pain with walking, stanindg, sitting 5/10 current 7/10 worst 3/10 best, with temporary relief when she is sitting "just right" and with naproxen. Agg: activity. Cormorbidities: hx of breast cancer, HTN, MCI, insomnia, cataracts    Limitations Sitting;Standing;Walking;House hold activities    How long can you sit  comfortably? Pain constant, reduced with neutral pelvic tilt    How long can you stand comfortably? Pain constant, increases with time    How long can you walk comfortably? Pain constant, increases with time    Patient Stated Goals Be able to move without hurting, walk 1 hour    Currently in Pain? No/denies    Pain Onset More than a month ago                TREATMENT Neuromuscular re-ed EC walk Head turns left/right, up/down- 4 x 16ft Walking backwards with EC - 4 x 10ft Seated reach out, down, forward, backward - x 12 Walking backwards head turns up/down - 4 x 51ft Wall rolls for vestibular rehabilitiation  -- x 4 B Busy backgrounds head turns up/down; left/right - x 15   Performed to improve balance and vestibular-based symptoms     PT Education - 08/12/20 1436    Education provided Yes    Education Details form/technique with exercise    Person(s) Educated Patient    Methods Explanation;Demonstration    Comprehension Verbalized understanding;Returned demonstration            PT Short Term Goals - 09/12/19 1419      PT SHORT TERM GOAL #1   Title Patient will demonstrate complaince with HEP to speed recovery and reduce total number of visits.    Baseline HEP given    Time 2    Period Weeks    Status New    Target Date 09/27/19             PT Long Term Goals - 07/15/20 0927      PT LONG TERM GOAL #1   Title Patient will improve hip strength to 4/5 grossly to facilitate improved mobility with ADLs.    Baseline 3, 4-; 12/22 deferred to NV; 11/09/2019: 4-/5; 12/07/2019 L hip flexor 4, abd and ext 3+; 01/29/2020: 4/5 for most hip motions, 3+/5 for abd, ext; 03/20/2020: 4/5 for hip movements    Time 8    Period Weeks    Status Achieved      PT LONG TERM GOAL #2   Title Patient will demonstrate walking tolerance increased to 30 minutes without increase in symptoms to demonstrate improved ability to perform walking program for cardiovascular health.    Baseline 10  minute tolerance (patient report); 12/22 10 minutes (per pt report); 11/09/2019: 7 min; 12/07/2019 unable to assess due to icy streets;  01/29/2020: 10 min; 03/20/2020 10 min; 05/14/2020: 10 min x 3 per day; 06/11/2020: 68min x 3 per day; 07/15/2020: 53min x 3 per day    Time 8    Period Weeks    Status On-going      PT LONG TERM GOAL #3   Title Patient will report worst pain of 3/10 during therapy to demonstrate reduced disability    Baseline Worst 7/10; 12/22 worst 9/10; 11/09/2019: 9/10; 12/07/2019 7/10; 01/29/2020: 7/10; 05/14/2020: 0/ 10    Time 8    Period Weeks    Status Achieved  PT LONG TERM GOAL #4   Title Patient will demonstrate ability to perform all bed mobility maneuvers without increase in pain for reduced disability with mobility and transfers with ADLs.    Baseline Reports pain with bed mobility supine <> sidelying L/R, sidelying to sitting EOB; 12/22 pt able to perform I without increased pain    Time 8    Period Weeks    Status Achieved      PT LONG TERM GOAL #5   Title Patient will improve 10MW speed to 1.0 m/sec to meet norms for minimal fall risk and full community ambulation.    Baseline 0.88 m/sec; 12/22 0.96 m/sec; 11/09/2019: deferred; 12/07/2019 0.90 m/sec; 01/29/2020: .9  m/sec; 03/20/2020: .13m/s; 05/14/2020: . 9 m/s; 06/11/2020: .2m/s; 07/15/2020: 1 m/s    Time 8    Period Weeks    Status Achieved      PT LONG TERM GOAL #6   Title Patient will increase 6MWT by 200 ft to demonstrate improvement in functional capacity for transition to cardiac walking program    Baseline 6MWT = 1195; 1/7/20201: deferred; 12/07/2019 1095; 01/29/2020: deferred secondary to fatigue; 03/20/2020: 1045ft; 07/15/2020: 1115ft    Time 8    Period Weeks    Status On-going      PT LONG TERM GOAL #7   Title Patient will improve her 5xsts to under 15 sec to indicate singificnat improvement in LE functional strength and decrease in fall risk.    Baseline 24.5 sec; 03/20/2020: 19.19 sec; 06/13/2020: 14. 92     Time 6    Period Weeks    Status Achieved      PT LONG TERM GOAL #8   Title Patient will improve FGA to 28/30 to indicate improvement in dynamic balance and improvement in stability.    Baseline 23/30; 06/11/2020: 25/30; 07/15/2020: 26/30    Time 6    Period Weeks    Status On-going                 Plan - 08/12/20 1443    Clinical Impression Statement COntiued to progress vestibular exercises, improvement with backwards ambulation compared to previous sessions indicating funtional carryover. Although patient is improving, she continues to have increased difficulty in terms of balance with standing and peforming head turns, as well as walking backwards. Patient will benefit from further skilled therapy focused on improving pain and spasms to return to prior level of function.    Personal Factors and Comorbidities Age;Comorbidity 3+;Time since onset of injury/illness/exacerbation;Comorbidity 2    Comorbidities MCI, HTN, CA, cataracts    Examination-Activity Limitations Bend;Sit;Squat;Stairs;Stand;Sleep    Examination-Participation Restrictions Cleaning;Community Activity    Stability/Clinical Decision Making Evolving/Moderate complexity    Rehab Potential Fair    Clinical Impairments Affecting Rehab Potential MCI    PT Frequency 2x / week    PT Duration 6 weeks    PT Treatment/Interventions ADLs/Self Care Home Management;Therapeutic exercise;Patient/family education;Neuromuscular re-education;Therapeutic activities;Functional mobility training;Balance training;Manual techniques;Gait training;Stair training;Moist Heat;Electrical Stimulation;Cryotherapy;Taping;Dry needling;Energy conservation;Passive range of motion;Joint Manipulations    PT Next Visit Plan Progress glute therex, pelvic-hip dissociation; check and progress HEP with stretches    PT Home Exercise Plan lateral step ups, step ups, hip kickers ext/abd red theraband    Consulted and Agree with Plan of Care Patient            Patient will benefit from skilled therapeutic intervention in order to improve the following deficits and impairments:  Decreased range of motion, Postural dysfunction, Pain, Abnormal  gait, Decreased activity tolerance, Decreased coordination, Decreased endurance, Decreased balance, Decreased mobility, Difficulty walking, Hypomobility, Increased muscle spasms, Impaired perceived functional ability, Decreased strength, Impaired flexibility  Visit Diagnosis: Muscle weakness (generalized)  Difficulty in walking, not elsewhere classified     Problem List Patient Active Problem List   Diagnosis Date Noted  . Candidal skin infection 04/29/2020  . Parkinson disease ([facility]) 02/22/2020  . 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 Bipolar disorder, in full remission, most recent episode depressed ([place] [facility]) 11/08/2019  . Avitaminosis D 08/24/2019  . Insomnia due to mental condition 05/31/2019  . Bipolar I disorder, most recent episode depressed ([place] [facility]) 05/31/2019  . Mild cognitive disorder 05/31/2019  . Alcohol use disorder, moderate, in sustained remission ([employer]) 05/31/2019  . Elevated tumor markers 05/27/2019  . GAD (generalized anxiety disorder) 04/10/2019  . Altered mental status 04/10/2019  . MDD (major depressive disorder), severe ([facility]) 01/31/2019  . High serum carbohydrate antigen 19-9 (CA19-9) 12/01/2018  . Adjustment disorder with anxious mood 11/24/2018  . Essential hypertension 11/24/2018  . B12 deficiency 10/17/2018  . Incisional hernia, without obstruction or gangrene 09/12/2018  . Genetic testing 04/07/2018  . Family history of lung cancer 03/24/2018  . Malignant neoplasm of upper-outer quadrant of right breast in female, estrogen receptor positive ([facility]) 03/17/2018  . Malignant neoplasm of upper-inner quadrant of right breast in female, estrogen receptor positive ([facility]) 03/17/2018  . History of alcohol dependence ([facility]) 02/07/2018  . History of psychosis 02/07/2018  . History of insomnia  02/07/2018    Blythe Stanford, PT DPT 08/12/2020, 2:49 PM  [facility] [address], [place], [address] Phone: [phone]   Fax:  [phone]  Name: Kendra [name] MRN: [id number] Date of Birth: 1946/06/09

## 2020-08-13 ENCOUNTER — Telehealth: Payer: Self-pay

## 2020-08-13 DIAGNOSIS — Z1211 Encounter for screening for malignant neoplasm of colon: Secondary | ICD-10-CM

## 2020-08-13 DIAGNOSIS — K432 Incisional hernia without obstruction or gangrene: Secondary | ICD-10-CM

## 2020-08-13 NOTE — Telephone Encounter (Signed)
Can refer to GI for colonoscopy and Gen Surg for hernias. Thanks

## 2020-08-13 NOTE — Telephone Encounter (Signed)
Copied from [facility] [phone]. Topic: General - Other >> Aug 13, 2020  1:01 PM Rainey Pines A wrote: Patient wants to know what Dr. B would advise I nregards to having her hernia addressed and getting a colonoscpy. Please advise

## 2020-08-14 ENCOUNTER — Ambulatory Visit: Payer: Medicare PPO

## 2020-08-14 ENCOUNTER — Other Ambulatory Visit: Payer: Self-pay

## 2020-08-14 DIAGNOSIS — M6281 Muscle weakness (generalized): Secondary | ICD-10-CM

## 2020-08-14 DIAGNOSIS — R262 Difficulty in walking, not elsewhere classified: Secondary | ICD-10-CM

## 2020-08-14 NOTE — Therapy (Signed)
[facility] [address], [place], [address] Phone: [phone]   Fax:  [phone]  Physical Therapy Treatment  Patient Details  Name: Kendra [name] MRN: [id number] Date of Birth: 02/28/1946 No data recorded  Encounter Date: 08/14/2020   PT End of Session - 08/14/20 1419    Visit Number 61    Number of Visits 62    Date for PT Re-Evaluation 08/28/20    PT Start Time 2353    PT Stop Time 1430    PT Time Calculation (min) 45 min    Equipment Utilized During Treatment Gait belt    Activity Tolerance Patient tolerated treatment well;No increased pain    Behavior During Therapy WFL for tasks assessed/performed           Past Medical History:  Diagnosis Date  . Anxiety   . Breast cancer ([place]) 03/2018   right breast cancer at 11:00 and 1:00  . Bursitis    bilateral hips and knees  . Complication of anesthesia   . Depression   . Family history of breast cancer 03/24/2018  . Family history of lung cancer 03/24/2018  . History of alcohol dependence ([place]) 2007   no ETOH; resolved since 2007  . History of hiatal hernia   . History of psychosis 2014   due to sleep disturbance  . Hypertension   . Personal history of radiation therapy 06/2018-07/2018   right breast ca  . PONV (postoperative nausea and vomiting)     Past Surgical History:  Procedure Laterality Date  . BREAST BIOPSY Right 03/14/2018   11:00 DCIS and invasive ductal carcinoma  . BREAST BIOPSY Right 03/14/2018   1:00 Invasive ductal carcinoma  . BREAST LUMPECTOMY Right 04/27/2018   lumpectomy of 11 and 1:00 cancers, clear margins, negative LN  . BREAST LUMPECTOMY WITH RADIOACTIVE SEED AND SENTINEL LYMPH NODE BIOPSY Right 04/27/2018   Procedure: RIGHT BREAST LUMPECTOMY WITH RADIOACTIVE SEED X 2 AND RIGHT SENTINEL LYMPH NODE BIOPSY;  Surgeon: [name], MD;  Location: [facility];  Service: General;  Laterality: Right;   . CATARACT EXTRACTION W/PHACO Left 08/30/2019   Procedure: CATARACT EXTRACTION PHACO AND INTRAOCULAR LENS PLACEMENT ([place]) LEFT panoptix toric  01:20.5  12.7%  10.26;  Surgeon: Leandrew Koyanagi, MD;  Location: [facility];  Service: Ophthalmology;  Laterality: Left;  . CATARACT EXTRACTION W/PHACO Right 09/20/2019   Procedure: CATARACT EXTRACTION PHACO AND INTRAOCULAR LENS PLACEMENT ([facility]) RIGHT 5.71  00:36.4  15.7%;  Surgeon: Leandrew Koyanagi, MD;  Location: [facility];  Service: Ophthalmology;  Laterality: Right;  . LAPAROTOMY N/A 02/10/2018   Procedure: EXPLORATORY LAPAROTOMY;  Surgeon: Isabel Caprice, MD;  [facility];  Service: Gynecology;  Laterality: N/A;  . MASS EXCISION  01/2018   abdominal  . OVARIAN CYST REMOVAL    . SALPINGOOPHORECTOMY Bilateral 02/10/2018   Procedure: BILATERAL SALPINGO OOPHORECTOMY; PERITONEAL WASHINGS;  Surgeon: Isabel Caprice, MD;  [facility];  Service: Gynecology;  Laterality: Bilateral;  . TONSILLECTOMY    . TONSILLECTOMY AND ADENOIDECTOMY  1953    There were no vitals filed for this visit.   Subjective Assessment - 08/14/20 1354    Subjective Patient states she feel is improving slightly with the balance, but continues to feel dizzy.    Pertinent History Patient report unclear regarding onset. Visited MD on Sept 16 and received injection with relief for approximately 1 month. Reports she cannot receive an additional injection pending eye  surgery. Received dx from MD of hip bursitis in B hips. Reports pain with walking, stanindg, sitting 5/10 current 7/10 worst 3/10 best, with temporary relief when she is sitting "just right" and with naproxen. Agg: activity. Cormorbidities: hx of breast cancer, HTN, MCI, insomnia, cataracts    Limitations Sitting;Standing;Walking;House hold activities    How long can you sit comfortably? Pain constant, reduced with neutral pelvic tilt    How long can you stand comfortably? Pain constant,  increases with time    How long can you walk comfortably? Pain constant, increases with time    Patient Stated Goals Be able to move without hurting, walk 1 hour    Currently in Pain? No/denies    Pain Onset More than a month ago                    TREATMENT Neuromuscular re-ed Seated reach out, down, forward, backward - x 15 Walking backwards head turns up/down - 4 x 44ft with use of ball; 2 x 75ft L/R Walking backwards with ball rows left/right - 4 x 65ft  Walking backwards with EC - 4 x 76ft Walking backwards with diagonal busy background - 3 x 34ft Wall rolls for vestibular rehabilitiation  -- x 4 B   Performed to improve balance and vestibular-based symptoms        PT Education - 08/14/20 1418    Education provided Yes    Education Details form/technique with exercise    Person(s) Educated Patient    Methods Explanation;Demonstration    Comprehension Verbalized understanding;Returned demonstration            PT Short Term Goals - 09/12/19 1419      PT SHORT TERM GOAL #1   Title Patient will demonstrate complaince with HEP to speed recovery and reduce total number of visits.    Baseline HEP given    Time 2    Period Weeks    Status New    Target Date 09/27/19             PT Long Term Goals - 07/15/20 0927      PT LONG TERM GOAL #1   Title Patient will improve hip strength to 4/5 grossly to facilitate improved mobility with ADLs.    Baseline 3, 4-; 12/22 deferred to NV; 11/09/2019: 4-/5; 12/07/2019 L hip flexor 4, abd and ext 3+; 01/29/2020: 4/5 for most hip motions, 3+/5 for abd, ext; 03/20/2020: 4/5 for hip movements    Time 8    Period Weeks    Status Achieved      PT LONG TERM GOAL #2   Title Patient will demonstrate walking tolerance increased to 30 minutes without increase in symptoms to demonstrate improved ability to perform walking program for cardiovascular health.    Baseline 10 minute tolerance (patient report); 12/22 10 minutes (per pt  report); 11/09/2019: 7 min; 12/07/2019 unable to assess due to icy streets;  01/29/2020: 10 min; 03/20/2020 10 min; 05/14/2020: 10 min x 3 per day; 06/11/2020: 64min x 3 per day; 07/15/2020: 68min x 3 per day    Time 8    Period Weeks    Status On-going      PT LONG TERM GOAL #3   Title Patient will report worst pain of 3/10 during therapy to demonstrate reduced disability    Baseline Worst 7/10; 12/22 worst 9/10; 11/09/2019: 9/10; 12/07/2019 7/10; 01/29/2020: 7/10; 05/14/2020: 0/ 10    Time 8    Period [place]  Status Achieved      PT LONG TERM GOAL #4   Title Patient will demonstrate ability to perform all bed mobility maneuvers without increase in pain for reduced disability with mobility and transfers with ADLs.    Baseline Reports pain with bed mobility supine <> sidelying L/R, sidelying to sitting EOB; 12/22 pt able to perform I without increased pain    Time 8    Period Weeks    Status Achieved      PT LONG TERM GOAL #5   Title Patient will improve 10MW speed to 1.0 m/sec to meet norms for minimal fall risk and full community ambulation.    Baseline 0.88 m/sec; 12/22 0.96 m/sec; 11/09/2019: deferred; 12/07/2019 0.90 m/sec; 01/29/2020: .9  m/sec; 03/20/2020: .55m/s; 05/14/2020: . 9 m/s; 06/11/2020: .40m/s; 07/15/2020: 1 m/s    Time 8    Period Weeks    Status Achieved      PT LONG TERM GOAL #6   Title Patient will increase 6MWT by 200 ft to demonstrate improvement in functional capacity for transition to cardiac walking program    Baseline 6MWT = 1195; 1/7/20201: deferred; 12/07/2019 1095; 01/29/2020: deferred secondary to fatigue; 03/20/2020: 1038ft; 07/15/2020: 1172ft    Time 8    Period Weeks    Status On-going      PT LONG TERM GOAL #7   Title Patient will improve her 5xsts to under 15 sec to indicate singificnat improvement in LE functional strength and decrease in fall risk.    Baseline 24.5 sec; 03/20/2020: 19.19 sec; 06/13/2020: 14. 92    Time 6    Period Weeks    Status Achieved      PT LONG  TERM GOAL #8   Title Patient will improve FGA to 28/30 to indicate improvement in dynamic balance and improvement in stability.    Baseline 23/30; 06/11/2020: 25/30; 07/15/2020: 26/30    Time 6    Period Weeks    Status On-going                 Plan - 08/14/20 1430    Clinical Impression Statement Performed exercises to address vestibular difficulties. Increased difficulties with busy background walking, however this is improved compared to previous session. Patient will benefit from further skilled therapy to return to prior level of function.    Personal Factors and Comorbidities Age;Comorbidity 3+;Time since onset of injury/illness/exacerbation;Comorbidity 2    Comorbidities MCI, HTN, CA, cataracts    Examination-Activity Limitations Bend;Sit;Squat;Stairs;Stand;Sleep    Examination-Participation Restrictions Cleaning;Community Activity    Stability/Clinical Decision Making Evolving/Moderate complexity    Rehab Potential Fair    Clinical Impairments Affecting Rehab Potential MCI    PT Frequency 2x / week    PT Duration 6 weeks    PT Treatment/Interventions ADLs/Self Care Home Management;Therapeutic exercise;Patient/family education;Neuromuscular re-education;Therapeutic activities;Functional mobility training;Balance training;Manual techniques;Gait training;Stair training;Moist Heat;Electrical Stimulation;Cryotherapy;Taping;Dry needling;Energy conservation;Passive range of motion;Joint Manipulations    PT Next Visit Plan Progress glute therex, pelvic-hip dissociation; check and progress HEP with stretches    PT Home Exercise Plan lateral step ups, step ups, hip kickers ext/abd red theraband    Consulted and Agree with Plan of Care Patient           Patient will benefit from skilled therapeutic intervention in order to improve the following deficits and impairments:  Decreased range of motion, Postural dysfunction, Pain, Abnormal gait, Decreased activity tolerance, Decreased  coordination, Decreased endurance, Decreased balance, Decreased mobility, Difficulty walking, Hypomobility, Increased muscle spasms, Impaired perceived  functional ability, Decreased strength, Impaired flexibility  Visit Diagnosis: Muscle weakness (generalized)  Difficulty in walking, not elsewhere classified     Problem List Patient Active Problem List   Diagnosis Date Noted  . Candidal skin infection 04/29/2020  . Parkinson disease ([facility]) 02/22/2020  . 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 Bipolar disorder, in full remission, most recent episode depressed ([place]) 11/08/2019  . Avitaminosis D 08/24/2019  . Insomnia due to mental condition 05/31/2019  . Bipolar I disorder, most recent episode depressed ([place]) 05/31/2019  . Mild cognitive disorder 05/31/2019  . Alcohol use disorder, moderate, in sustained remission ([place]) 05/31/2019  . Elevated tumor markers 05/27/2019  . GAD (generalized anxiety disorder) 04/10/2019  . Altered mental status 04/10/2019  . MDD (major depressive disorder), severe ([place]) 01/31/2019  . High serum carbohydrate antigen 19-9 (CA19-9) 12/01/2018  . Adjustment disorder with anxious mood 11/24/2018  . Essential hypertension 11/24/2018  . B12 deficiency 10/17/2018  . Incisional hernia, without obstruction or gangrene 09/12/2018  . Genetic testing 04/07/2018  . Family history of lung cancer 03/24/2018  . Malignant neoplasm of upper-outer quadrant of right breast in female, estrogen receptor positive ([facility]) 03/17/2018  . Malignant neoplasm of upper-inner quadrant of right breast in female, estrogen receptor positive ([place]) 03/17/2018  . History of alcohol dependence ([facility]) 02/07/2018  . History of psychosis 02/07/2018  . History of insomnia 02/07/2018    Blythe Stanford, PT DPT 08/14/2020, 2:46 PM  [facility] [address], [place], [address] Phone: [phone]   Fax:  [phone]  Name:  Kendra [name] MRN: [id number] Date of Birth: 03-08-1946

## 2020-08-15 NOTE — Addendum Note (Signed)
Addended by: Jules Schick on: 08/15/2020 02:44 PM   Modules accepted: Orders

## 2020-08-19 ENCOUNTER — Ambulatory Visit: Payer: Medicare PPO

## 2020-08-19 ENCOUNTER — Other Ambulatory Visit: Payer: Self-pay

## 2020-08-19 DIAGNOSIS — R262 Difficulty in walking, not elsewhere classified: Secondary | ICD-10-CM

## 2020-08-19 DIAGNOSIS — M6281 Muscle weakness (generalized): Secondary | ICD-10-CM | POA: Diagnosis not present

## 2020-08-19 DIAGNOSIS — H9193 Unspecified hearing loss, bilateral: Secondary | ICD-10-CM | POA: Diagnosis not present

## 2020-08-19 DIAGNOSIS — G20 Parkinson's disease: Secondary | ICD-10-CM | POA: Diagnosis not present

## 2020-08-19 NOTE — Therapy (Signed)
[facility] [address], [place], [address] Phone: [phone]   Fax:  [phone]  Physical Therapy Treatment  Patient Details  Name: Kendra [name] MRN: [id number] Date of Birth: [birth year] No data recorded  Encounter Date: 08/19/2020   PT End of Session - 08/19/20 1432    Visit Number 62    Number of Visits 62    Date for PT Re-Evaluation 08/28/20    PT Start Time 1430    PT Stop Time 1515    PT Time Calculation (min) 45 min    Activity Tolerance Patient tolerated treatment well;No increased pain    Behavior During Therapy WFL for tasks assessed/performed           Past Medical History:  Diagnosis Date  . Anxiety   . Breast cancer ([facility]) 03/2018   right breast cancer at 11:00 and 1:00  . Bursitis    bilateral hips and knees  . Complication of anesthesia   . Depression   . Family history of breast cancer 03/24/2018  . Family history of lung cancer 03/24/2018  . History of alcohol dependence ([facility]) 2007   no ETOH; resolved since 2007  . History of hiatal hernia   . History of psychosis 2014   due to sleep disturbance  . Hypertension   . Personal history of radiation therapy 06/2018-07/2018   right breast ca  . PONV (postoperative nausea and vomiting)     Past Surgical History:  Procedure Laterality Date  . BREAST BIOPSY Right 03/14/2018   11:00 DCIS and invasive ductal carcinoma  . BREAST BIOPSY Right 03/14/2018   1:00 Invasive ductal carcinoma  . BREAST LUMPECTOMY Right 04/27/2018   lumpectomy of 11 and 1:00 cancers, clear margins, negative LN  . BREAST LUMPECTOMY WITH RADIOACTIVE SEED AND SENTINEL LYMPH NODE BIOPSY Right 04/27/2018   Procedure: RIGHT BREAST LUMPECTOMY WITH RADIOACTIVE SEED X 2 AND RIGHT SENTINEL LYMPH NODE BIOPSY;  Surgeon: [name], MD;  Location: [facility];  Service: General;  Laterality: Right;  . CATARACT EXTRACTION W/PHACO Left 08/30/2019    Procedure: CATARACT EXTRACTION PHACO AND INTRAOCULAR LENS PLACEMENT ([place]) LEFT panoptix toric  01:20.5  12.7%  10.26;  Surgeon: Leandrew Koyanagi, MD;  Location: [facility];  Service: Ophthalmology;  Laterality: Left;  . CATARACT EXTRACTION W/PHACO Right 09/20/2019   Procedure: CATARACT EXTRACTION PHACO AND INTRAOCULAR LENS PLACEMENT ([facility]) RIGHT 5.71  00:36.4  15.7%;  Surgeon: Leandrew Koyanagi, MD;  Location: [facility];  Service: Ophthalmology;  Laterality: Right;  . LAPAROTOMY N/A 02/10/2018   Procedure: EXPLORATORY LAPAROTOMY;  Surgeon: Isabel Caprice, MD;  [facility];  Service: Gynecology;  Laterality: N/A;  . MASS EXCISION  01/2018   abdominal  . OVARIAN CYST REMOVAL    . SALPINGOOPHORECTOMY Bilateral 02/10/2018   Procedure: BILATERAL SALPINGO OOPHORECTOMY; PERITONEAL WASHINGS;  Surgeon: Isabel Caprice, MD;  [facility];  Service: Gynecology;  Laterality: Bilateral;  . TONSILLECTOMY    . TONSILLECTOMY AND ADENOIDECTOMY  1953    There were no vitals filed for this visit.   Subjective Assessment - 08/19/20 1431    Subjective Patient states she continues to feel unbalanced when walking and standing. Patient states things are "feeling pretty good" however.    Pertinent History Patient report unclear regarding onset. Visited MD on Sept 16 and received injection with relief for approximately 1 month. Reports she cannot receive an additional injection pending eye surgery. Received dx from MD  of hip bursitis in B hips. Reports pain with walking, stanindg, sitting 5/10 current 7/10 worst 3/10 best, with temporary relief when she is sitting "just right" and with naproxen. Agg: activity. Cormorbidities: hx of breast cancer, HTN, MCI, insomnia, cataracts    Limitations Sitting;Standing;Walking;House hold activities    How long can you sit comfortably? Pain constant, reduced with neutral pelvic tilt    How long can you stand comfortably? Pain constant, increases  with time    How long can you walk comfortably? Pain constant, increases with time    Patient Stated Goals Be able to move without hurting, walk 1 hour    Currently in Pain? No/denies    Pain Onset More than a month ago                     TREATMENT Neuromuscular re-ed Seated reach out, down, forward, backward - x 15 Walking backwards with focus on improving speed and large steps - 4 x 21ft Walking backwards with busy ground going forward/backward - 4 x 77ft  Walking backwards with ball toss in all directions - 4 x 7ft Walking backwards with EC - 4 x 35ft Walking backwards head turns up/down - 2 x 69ft with use of ball; 2 x 77ft L/R Wall rolls for vestibular rehabilitiation  -- x 4 B   Performed to improve balance and vestibular-based symptoms    PT Education - 08/19/20 1432    Education provided Yes    Education Details form/technique with exercise    Person(s) Educated Patient    Methods Explanation;Demonstration    Comprehension Verbalized understanding;Returned demonstration            PT Short Term Goals - 09/12/19 1419      PT SHORT TERM GOAL #1   Title Patient will demonstrate complaince with HEP to speed recovery and reduce total number of visits.    Baseline HEP given    Time 2    Period Weeks    Status New    Target Date 09/27/19             PT Long Term Goals - 07/15/20 0927      PT LONG TERM GOAL #1   Title Patient will improve hip strength to 4/5 grossly to facilitate improved mobility with ADLs.    Baseline 3, 4-; 12/22 deferred to NV; 11/09/2019: 4-/5; 12/07/2019 L hip flexor 4, abd and ext 3+; 01/29/2020: 4/5 for most hip motions, 3+/5 for abd, ext; 03/20/2020: 4/5 for hip movements    Time 8    Period Weeks    Status Achieved      PT LONG TERM GOAL #2   Title Patient will demonstrate walking tolerance increased to 30 minutes without increase in symptoms to demonstrate improved ability to perform walking program for cardiovascular health.     Baseline 10 minute tolerance (patient report); 12/22 10 minutes (per pt report); 11/09/2019: 7 min; 12/07/2019 unable to assess due to icy streets;  01/29/2020: 10 min; 03/20/2020 10 min; 05/14/2020: 10 min x 3 per day; 06/11/2020: 7min x 3 per day; 07/15/2020: 64min x 3 per day    Time 8    Period Weeks    Status On-going      PT LONG TERM GOAL #3   Title Patient will report worst pain of 3/10 during therapy to demonstrate reduced disability    Baseline Worst 7/10; 12/22 worst 9/10; 11/09/2019: 9/10; 12/07/2019 7/10; 01/29/2020: 7/10; 05/14/2020: 0/ 10  Time 8    Period Weeks    Status Achieved      PT LONG TERM GOAL #4   Title Patient will demonstrate ability to perform all bed mobility maneuvers without increase in pain for reduced disability with mobility and transfers with ADLs.    Baseline Reports pain with bed mobility supine <> sidelying L/R, sidelying to sitting EOB; 12/22 pt able to perform I without increased pain    Time 8    Period Weeks    Status Achieved      PT LONG TERM GOAL #5   Title Patient will improve 10MW speed to 1.0 m/sec to meet norms for minimal fall risk and full community ambulation.    Baseline 0.88 m/sec; 12/22 0.96 m/sec; 11/09/2019: deferred; 12/07/2019 0.90 m/sec; 01/29/2020: .9  m/sec; 03/20/2020: .92m/s; 05/14/2020: . 9 m/s; 06/11/2020: .89m/s; 07/15/2020: 1 m/s    Time 8    Period Weeks    Status Achieved      PT LONG TERM GOAL #6   Title Patient will increase 6MWT by 200 ft to demonstrate improvement in functional capacity for transition to cardiac walking program    Baseline 6MWT = 1195; 1/7/20201: deferred; 12/07/2019 1095; 01/29/2020: deferred secondary to fatigue; 03/20/2020: 1027ft; 07/15/2020: 1197ft    Time 8    Period Weeks    Status On-going      PT LONG TERM GOAL #7   Title Patient will improve her 5xsts to under 15 sec to indicate singificnat improvement in LE functional strength and decrease in fall risk.    Baseline 24.5 sec; 03/20/2020: 19.19 sec;  06/13/2020: 14. 92    Time 6    Period Weeks    Status Achieved      PT LONG TERM GOAL #8   Title Patient will improve FGA to 28/30 to indicate improvement in dynamic balance and improvement in stability.    Baseline 23/30; 06/11/2020: 25/30; 07/15/2020: 26/30    Time 6    Period Weeks    Status On-going                 Plan - 08/19/20 1434    Clinical Impression Statement Continued to focus on improving backwards walking as she continues to have increased balance difficulties after stopping from moving. Patient continues to lean backward to try to correct balance, but overcorrects. Patient is improving overall as she needs less assistance to regain balance. Patient will benefit from further skilled therapy to return to prior level of function.    Personal Factors and Comorbidities Age;Comorbidity 3+;Time since onset of injury/illness/exacerbation;Comorbidity 2    Comorbidities MCI, HTN, CA, cataracts    Examination-Activity Limitations Bend;Sit;Squat;Stairs;Stand;Sleep    Examination-Participation Restrictions Cleaning;Community Activity    Stability/Clinical Decision Making Evolving/Moderate complexity    Rehab Potential Fair    Clinical Impairments Affecting Rehab Potential MCI    PT Frequency 2x / week    PT Duration 6 weeks    PT Treatment/Interventions ADLs/Self Care Home Management;Therapeutic exercise;Patient/family education;Neuromuscular re-education;Therapeutic activities;Functional mobility training;Balance training;Manual techniques;Gait training;Stair training;Moist Heat;Electrical Stimulation;Cryotherapy;Taping;Dry needling;Energy conservation;Passive range of motion;Joint Manipulations    PT Next Visit Plan Progress glute therex, pelvic-hip dissociation; check and progress HEP with stretches    PT Home Exercise Plan lateral step ups, step ups, hip kickers ext/abd red theraband    Consulted and Agree with Plan of Care Patient           Patient will benefit from  skilled therapeutic intervention in order to improve the  following deficits and impairments:  Decreased range of motion, Postural dysfunction, Pain, Abnormal gait, Decreased activity tolerance, Decreased coordination, Decreased endurance, Decreased balance, Decreased mobility, Difficulty walking, Hypomobility, Increased muscle spasms, Impaired perceived functional ability, Decreased strength, Impaired flexibility  Visit Diagnosis: Muscle weakness (generalized)  Difficulty in walking, not elsewhere classified     Problem List Patient Active Problem List   Diagnosis Date Noted  . Candidal skin infection 04/29/2020  . Parkinson disease ([place]) 02/22/2020  . 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 Bipolar disorder, in full remission, most recent episode depressed ([place]) 11/08/2019  . Avitaminosis D 08/24/2019  . Insomnia due to mental condition 05/31/2019  . Bipolar I disorder, most recent episode depressed ([place]) 05/31/2019  . Mild cognitive disorder 05/31/2019  . Alcohol use disorder, moderate, in sustained remission ([place]) 05/31/2019  . Elevated tumor markers 05/27/2019  . GAD (generalized anxiety disorder) 04/10/2019  . Altered mental status 04/10/2019  . MDD (major depressive disorder), severe ([place]) 01/31/2019  . High serum carbohydrate antigen 19-9 (CA19-9) 12/01/2018  . Adjustment disorder with anxious mood 11/24/2018  . Essential hypertension 11/24/2018  . B12 deficiency 10/17/2018  . Incisional hernia, without obstruction or gangrene 09/12/2018  . Genetic testing 04/07/2018  . Family history of lung cancer 03/24/2018  . Malignant neoplasm of upper-outer quadrant of right breast in female, estrogen receptor positive ([place]) 03/17/2018  . Malignant neoplasm of upper-inner quadrant of right breast in female, estrogen receptor positive ([place]) 03/17/2018  . History of alcohol dependence ([place]) 02/07/2018  . History of psychosis 02/07/2018  . History of insomnia 02/07/2018    Blythe Stanford,  PT DPT 08/19/2020, 3:32 PM  [facility] [address], [place], [address] Phone: [phone]   Fax:  [phone]  Name: Kendra [name] MRN: [id number] Date of Birth: 1945-12-16

## 2020-08-21 ENCOUNTER — Ambulatory Visit: Payer: Medicare PPO

## 2020-08-21 ENCOUNTER — Other Ambulatory Visit: Payer: Self-pay

## 2020-08-21 DIAGNOSIS — M6281 Muscle weakness (generalized): Secondary | ICD-10-CM | POA: Diagnosis not present

## 2020-08-21 DIAGNOSIS — R262 Difficulty in walking, not elsewhere classified: Secondary | ICD-10-CM | POA: Diagnosis not present

## 2020-08-22 ENCOUNTER — Ambulatory Visit: Payer: Self-pay | Admitting: General Surgery

## 2020-08-22 NOTE — Therapy (Signed)
[facility] [address] [place], [address] Phone: [phone]   Fax:  [phone]  Physical Therapy Treatment  Patient Details  Name: Kendra [name] MRN: [id number] Date of Birth: [birth year] No data recorded  Encounter Date: 08/21/2020   PT End of Session - 08/21/20 1351    Visit Number 63    Number of Visits 70    Date for PT Re-Evaluation 08/28/20    PT Start Time 6962    PT Stop Time 1430    PT Time Calculation (min) 45 min    Activity Tolerance Patient tolerated treatment well;No increased pain    Behavior During Therapy WFL for tasks assessed/performed           Past Medical History:  Diagnosis Date  . Anxiety   . Breast cancer ([place]) 03/2018   right breast cancer at 11:00 and 1:00  . Bursitis    bilateral hips and knees  . Complication of anesthesia   . Depression   . Family history of breast cancer 03/24/2018  . Family history of lung cancer 03/24/2018  . History of alcohol dependence ([place]) 2007   no ETOH; resolved since 2007  . History of hiatal hernia   . History of psychosis 2014   due to sleep disturbance  . Hypertension   . Personal history of radiation therapy 06/2018-07/2018   right breast ca  . PONV (postoperative nausea and vomiting)     Past Surgical History:  Procedure Laterality Date  . BREAST BIOPSY Right 03/14/2018   11:00 DCIS and invasive ductal carcinoma  . BREAST BIOPSY Right 03/14/2018   1:00 Invasive ductal carcinoma  . BREAST LUMPECTOMY Right 04/27/2018   lumpectomy of 11 and 1:00 cancers, clear margins, negative LN  . BREAST LUMPECTOMY WITH RADIOACTIVE SEED AND SENTINEL LYMPH NODE BIOPSY Right 04/27/2018   Procedure: RIGHT BREAST LUMPECTOMY WITH RADIOACTIVE SEED X 2 AND RIGHT SENTINEL LYMPH NODE BIOPSY;  Surgeon: [name], MD;  Location: [facility];  Service: General;  Laterality: Right;  . CATARACT EXTRACTION W/PHACO Left 08/30/2019    Procedure: CATARACT EXTRACTION PHACO AND INTRAOCULAR LENS PLACEMENT ([place]) LEFT panoptix toric  01:20.5  12.7%  10.26;  Surgeon: Leandrew Koyanagi, MD;  Location: [facility];  Service: Ophthalmology;  Laterality: Left;  . CATARACT EXTRACTION W/PHACO Right 09/20/2019   Procedure: CATARACT EXTRACTION PHACO AND INTRAOCULAR LENS PLACEMENT ([facility]) RIGHT 5.71  00:36.4  15.7%;  Surgeon: Leandrew Koyanagi, MD;  Location: [facility];  Service: Ophthalmology;  Laterality: Right;  . LAPAROTOMY N/A 02/10/2018   Procedure: EXPLORATORY LAPAROTOMY;  Surgeon: Isabel Caprice, MD;  Location: [facility];  Service: Gynecology;  Laterality: N/A;  . MASS EXCISION  01/2018   abdominal  . OVARIAN CYST REMOVAL    . SALPINGOOPHORECTOMY Bilateral 02/10/2018   Procedure: BILATERAL SALPINGO OOPHORECTOMY; PERITONEAL WASHINGS;  Surgeon: Isabel Caprice, MD;  Location: [facility];  Service: Gynecology;  Laterality: Bilateral;  . TONSILLECTOMY    . TONSILLECTOMY AND ADENOIDECTOMY  1953    There were no vitals filed for this visit.   Subjective Assessment - 08/21/20 1348    Subjective Patient reports they increased her Parkinson's medication which has resulted in her being more unbalanced since the increase in dose.    Pertinent History Patient report unclear regarding onset. Visited MD on Sept 16 and received injection with relief for approximately 1 month. Reports she cannot receive an additional injection pending eye surgery. Received dx from  MD of hip bursitis in B hips. Reports pain with walking, stanindg, sitting 5/10 current 7/10 worst 3/10 best, with temporary relief when she is sitting "just right" and with naproxen. Agg: activity. Cormorbidities: hx of breast cancer, HTN, MCI, insomnia, cataracts    Limitations Sitting;Standing;Walking;House hold activities    How long can you sit comfortably? Pain constant, reduced with neutral pelvic tilt    How long can you stand comfortably? Pain constant, increases  with time    How long can you walk comfortably? Pain constant, increases with time    Patient Stated Goals Be able to move without hurting, walk 1 hour    Currently in Pain? No/denies    Pain Onset More than a month ago                TREATMENT Neuromuscular re-ed Seated reach out, down, forward, backward - x 15 Standing Head turns up/down, left/right EC  Walking backwards EC with focus on improving speed and large steps - 4 x 71ft Standing on airex pad EO - head turns right/left- up/down - x 20; marches - x 20  Standing on airex pad EC - 90 sec  Soccer kicks in standing - x 20  Wall rolls for vestibular rehabilitiation  -- x 6 B   Performed to improve balance and vestibular-based symptoms     PT Education - 08/21/20 1350    Education provided Yes    Education Details form/technique with exercise    Person(s) Educated Patient    Methods Explanation;Demonstration    Comprehension Verbalized understanding;Returned demonstration            PT Short Term Goals - 09/12/19 1419      PT SHORT TERM GOAL #1   Title Patient will demonstrate complaince with HEP to speed recovery and reduce total number of visits.    Baseline HEP given    Time 2    Period Weeks    Status New    Target Date 09/27/19             PT Long Term Goals - 07/15/20 0927      PT LONG TERM GOAL #1   Title Patient will improve hip strength to 4/5 grossly to facilitate improved mobility with ADLs.    Baseline 3, 4-; 12/22 deferred to NV; 11/09/2019: 4-/5; 12/07/2019 L hip flexor 4, abd and ext 3+; 01/29/2020: 4/5 for most hip motions, 3+/5 for abd, ext; 03/20/2020: 4/5 for hip movements    Time 8    Period Weeks    Status Achieved      PT LONG TERM GOAL #2   Title Patient will demonstrate walking tolerance increased to 30 minutes without increase in symptoms to demonstrate improved ability to perform walking program for cardiovascular health.    Baseline 10 minute tolerance (patient report); 12/22 10  minutes (per pt report); 11/09/2019: 7 min; 12/07/2019 unable to assess due to icy streets;  01/29/2020: 10 min; 03/20/2020 10 min; 05/14/2020: 10 min x 3 per day; 06/11/2020: 47min x 3 per day; 07/15/2020: 37min x 3 per day    Time 8    Period Weeks    Status On-going      PT LONG TERM GOAL #3   Title Patient will report worst pain of 3/10 during therapy to demonstrate reduced disability    Baseline Worst 7/10; 12/22 worst 9/10; 11/09/2019: 9/10; 12/07/2019 7/10; 01/29/2020: 7/10; 05/14/2020: 0/ 10    Time 8    Period [place]  Status Achieved      PT LONG TERM GOAL #4   Title Patient will demonstrate ability to perform all bed mobility maneuvers without increase in pain for reduced disability with mobility and transfers with ADLs.    Baseline Reports pain with bed mobility supine <> sidelying L/R, sidelying to sitting EOB; 12/22 pt able to perform I without increased pain    Time 8    Period Weeks    Status Achieved      PT LONG TERM GOAL #5   Title Patient will improve 10MW speed to 1.0 m/sec to meet norms for minimal fall risk and full community ambulation.    Baseline 0.88 m/sec; 12/22 0.96 m/sec; 11/09/2019: deferred; 12/07/2019 0.90 m/sec; 01/29/2020: .9  m/sec; 03/20/2020: .74m/s; 05/14/2020: . 9 m/s; 06/11/2020: .48m/s; 07/15/2020: 1 m/s    Time 8    Period Weeks    Status Achieved      PT LONG TERM GOAL #6   Title Patient will increase 6MWT by 200 ft to demonstrate improvement in functional capacity for transition to cardiac walking program    Baseline 6MWT = 1195; 1/7/20201: deferred; 12/07/2019 1095; 01/29/2020: deferred secondary to fatigue; 03/20/2020: 104ft; 07/15/2020: 1121ft    Time 8    Period Weeks    Status On-going      PT LONG TERM GOAL #7   Title Patient will improve her 5xsts to under 15 sec to indicate singificnat improvement in LE functional strength and decrease in fall risk.    Baseline 24.5 sec; 03/20/2020: 19.19 sec; 06/13/2020: 14. 92    Time 6    Period Weeks    Status Achieved       PT LONG TERM GOAL #8   Title Patient will improve FGA to 28/30 to indicate improvement in dynamic balance and improvement in stability.    Baseline 23/30; 06/11/2020: 25/30; 07/15/2020: 26/30    Time 6    Period Weeks    Status On-going                 Plan - 08/21/20 1408    Clinical Impression Statement Performed exercises to address limitations with balance and instability improvement with performance of exercises. Patient with greater postural sway with exercises requiring modA of support to maintain balance x 1 thoruhgout the session. Patient able to perform greater amount of rotational movements compared to previous sessions. Patient will beenfit from fruhter skilled therapy focused on improving limitations to return to prior level of function.    Personal Factors and Comorbidities Age;Comorbidity 3+;Time since onset of injury/illness/exacerbation;Comorbidity 2    Comorbidities MCI, HTN, CA, cataracts    Examination-Activity Limitations Bend;Sit;Squat;Stairs;Stand;Sleep    Examination-Participation Restrictions Cleaning;Community Activity    Stability/Clinical Decision Making Evolving/Moderate complexity    Rehab Potential Fair    Clinical Impairments Affecting Rehab Potential MCI    PT Frequency 2x / week    PT Duration 6 weeks    PT Treatment/Interventions ADLs/Self Care Home Management;Therapeutic exercise;Patient/family education;Neuromuscular re-education;Therapeutic activities;Functional mobility training;Balance training;Manual techniques;Gait training;Stair training;Moist Heat;Electrical Stimulation;Cryotherapy;Taping;Dry needling;Energy conservation;Passive range of motion;Joint Manipulations    PT Next Visit Plan Progress glute therex, pelvic-hip dissociation; check and progress HEP with stretches    PT Home Exercise Plan lateral step ups, step ups, hip kickers ext/abd red theraband    Consulted and Agree with Plan of Care Patient           Patient will benefit  from skilled therapeutic intervention in order to improve the following deficits and  impairments:  Decreased range of motion, Postural dysfunction, Pain, Abnormal gait, Decreased activity tolerance, Decreased coordination, Decreased endurance, Decreased balance, Decreased mobility, Difficulty walking, Hypomobility, Increased muscle spasms, Impaired perceived functional ability, Decreased strength, Impaired flexibility  Visit Diagnosis: Muscle weakness (generalized)  Difficulty in walking, not elsewhere classified     Problem List Patient Active Problem List   Diagnosis Date Noted  . Candidal skin infection 04/29/2020  . Parkinson disease ([place]) 02/22/2020  . 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 Bipolar disorder, in full remission, most recent episode depressed ([place]) 11/08/2019  . Avitaminosis D 08/24/2019  . Insomnia due to mental condition 05/31/2019  . Bipolar I disorder, most recent episode depressed ([place]) 05/31/2019  . Mild cognitive disorder 05/31/2019  . Alcohol use disorder, moderate, in sustained remission ([place]) 05/31/2019  . Elevated tumor markers 05/27/2019  . GAD (generalized anxiety disorder) 04/10/2019  . Altered mental status 04/10/2019  . MDD (major depressive disorder), severe ([place]) 01/31/2019  . High serum carbohydrate antigen 19-9 (CA19-9) 12/01/2018  . Adjustment disorder with anxious mood 11/24/2018  . Essential hypertension 11/24/2018  . B12 deficiency 10/17/2018  . Incisional hernia, without obstruction or gangrene 09/12/2018  . Genetic testing 04/07/2018  . Family history of lung cancer 03/24/2018  . Malignant neoplasm of upper-outer quadrant of right breast in female, estrogen receptor positive ([place]) 03/17/2018  . Malignant neoplasm of upper-inner quadrant of right breast in female, estrogen receptor positive ([place]) 03/17/2018  . History of alcohol dependence ([place]) 02/07/2018  . History of psychosis 02/07/2018  . History of insomnia 02/07/2018    Blythe Stanford, PT DPT 08/22/2020, 8:21 AM  [facility] [address] [place], [address] Phone: [phone]   Fax:  [phone]  Name: Sparkles Mcneely MRN: [id number] Date of Birth: 10/09/1946

## 2020-08-24 NOTE — Progress Notes (Signed)
[facility]  [address], [address] [place], [place] [address] Phone: [phone]  Fax: [phone]   Clinic Day:  08/26/2020  Referring physician: Virginia Crews, MD  Chief Complaint: Kendra [name] is a 74 y.o. female with multifocal stage IA right breast cancerand B12 deficiency who is seen for 4 month assessment.   HPI: The patient was last seen in the medical oncology clinic von 04/29/2020. At that time, she felt "ok". She denied any breast concerns. She had a recent UTI. Exam was stable. CBC and CMP were normal. Folate was 7.2. CA 125 was 7.9. CA 27.29 was 22.3. CA 19-9 was 38.  She continued Femara.   Bilateral mammogram and left axilla breast ultrasound on 06/12/2020 noted expected surgical changes at the lumpectomy site in the superior right breast.  A benign cyst was seen in the lateral aspect of the left breast. There was no evidence of malignancy in the bilateral breasts. Diagnostic mammogram was suggested in 1 year.   Bone density on 06/12/2020 showed osteopenia at the right femur neck with a T-score of -1.4.   Patient has been undergoing PT for muscle weakness.   During the interim, she felt "ok". She continues to use MiraLax for constipation. Back pain is improving. No cough. She performs self breast exams monthly. She is using Nystatin powder for right breast moisture. Her husband reports that she has not been taking calcium. Her cousin gives her monthly B12 injections.   She was recently diagnosed with Parkinson's disease. She feels unsteady and off balance at times. She feels dizzy while walking. She states her hearing and memory has slightly worsened. She relies on her husband for help.   Given recent bone density study she will consider Prolia.   Past Medical History:  Diagnosis Date  . Anxiety   . Breast cancer ([place]) 03/2018   right breast cancer at 11:00 and 1:00  . Bursitis    bilateral hips and knees  . Complication of  anesthesia   . Depression   . Family history of breast cancer 03/24/2018  . Family history of lung cancer 03/24/2018  . History of alcohol dependence ([place]) 2007   no ETOH; resolved since 2007  . History of hiatal hernia   . History of psychosis 2014   due to sleep disturbance  . Hypertension   . Parkinson disease ([place])   . Personal history of radiation therapy 06/2018-07/2018   right breast ca  . PONV (postoperative nausea and vomiting)     Past Surgical History:  Procedure Laterality Date  . BREAST BIOPSY Right 03/14/2018   11:00 DCIS and invasive ductal carcinoma  . BREAST BIOPSY Right 03/14/2018   1:00 Invasive ductal carcinoma  . BREAST LUMPECTOMY Right 04/27/2018   lumpectomy of 11 and 1:00 cancers, clear margins, negative LN  . BREAST LUMPECTOMY WITH RADIOACTIVE SEED AND SENTINEL LYMPH NODE BIOPSY Right 04/27/2018   Procedure: RIGHT BREAST LUMPECTOMY WITH RADIOACTIVE SEED X 2 AND RIGHT SENTINEL LYMPH NODE BIOPSY;  Surgeon: [name], MD;  Location: [place];  Service: General;  Laterality: Right;  . CATARACT EXTRACTION W/PHACO Left 08/30/2019   Procedure: CATARACT EXTRACTION PHACO AND INTRAOCULAR LENS PLACEMENT ([place]) LEFT panoptix toric  01:20.5  12.7%  10.26;  Surgeon: Leandrew Koyanagi, MD;  Location: [place];  Service: Ophthalmology;  Laterality: Left;  . CATARACT EXTRACTION W/PHACO Right 09/20/2019   Procedure: CATARACT EXTRACTION PHACO AND INTRAOCULAR LENS PLACEMENT ([facility]) RIGHT 5.71  00:36.4  15.7%;  Surgeon: Leandrew Koyanagi, MD;  Location: [place];  Service: Ophthalmology;  Laterality: Right;  . LAPAROTOMY N/A 02/10/2018   Procedure: EXPLORATORY LAPAROTOMY;  Surgeon: Isabel Caprice, MD;  Location: [facility];  Service: Gynecology;  Laterality: N/A;  . MASS EXCISION  01/2018   abdominal  . OVARIAN CYST REMOVAL    . SALPINGOOPHORECTOMY Bilateral 02/10/2018   Procedure: BILATERAL SALPINGO OOPHORECTOMY; PERITONEAL WASHINGS;   Surgeon: Isabel Caprice, MD;  Location: [facility];  Service: Gynecology;  Laterality: Bilateral;  . TONSILLECTOMY    . TONSILLECTOMY AND ADENOIDECTOMY  1952    Family History  Problem Relation Age of Onset  . Diabetes Mother   . Breast cancer Mother 52  . Hypertension Mother   . Lung cancer Father        asbestos exposure  . Heart disease Brother 34  . Breast cancer Cousin 72       paternal cousin  . Heart disease Paternal Grandmother 54  . Heart disease Paternal Grandfather     Social History:  reports that she has never smoked. She has never used smokeless tobacco. She reports previous alcohol use. She reports that she does not use drugs. She is married to her husband [name]. They live in [place]. The patient is accompanied by [name] today.  Allergies:  Allergies  Allergen Reactions  . Hydroxyzine Anaphylaxis    Tongue swollen  . Other Other (See Comments)    Food poisoning   . Shrimp [Shellfish Allergy] Other (See Comments)    Food poisoning     Current Medications: Current Outpatient Medications  Medication Sig Dispense Refill  . calcium citrate-vitamin D (CITRACAL+D) 315-200 MG-UNIT tablet Take 4 tablets by mouth daily.    . carbidopa-levodopa (SINEMET IR) 25-100 MG tablet Take 2 tablets by mouth 4 (four) times daily.     . carbidopa-levodopa (SINEMET IR) 25-100 MG tablet Take 1 tablet by mouth at bedtime.    . cyanocobalamin (,VITAMIN B-12,) 1000 MCG/ML injection Inject 1 mL (1,000 mcg total) into the muscle every 30 (thirty) days. 3 mL 3  . letrozole (FEMARA) 2.5 MG tablet Take 1 tablet (2.5 mg total) by mouth daily. 90 tablet 3  . lisinopril-hydrochlorothiazide (ZESTORETIC) 20-12.5 MG tablet Take 1 tablet by mouth daily. 90 tablet 3  . polyethylene glycol (MIRALAX / GLYCOLAX) 17 g packet Take 17 g by mouth daily.    . risperiDONE (RISPERDAL) 2 MG tablet Take 0.5 tablets (1 mg total) by mouth 2 (two) times daily. 90 tablet 1  . traZODone (DESYREL) 50 MG tablet Take  0.5-1 tablets (25-50 mg total) by mouth at bedtime as needed for sleep. 90 tablet 1  . Acetaminophen 500 MG capsule  (Patient not taking: Reported on 04/29/2020)    . cephALEXin (KEFLEX) 500 MG capsule Take 1 capsule (500 mg total) by mouth 3 (three) times daily. (Patient not taking: Reported on 04/29/2020) 15 capsule 0  . FLUoxetine HCl 60 MG TABS Take 60 mg by mouth daily. (Patient not taking: Reported on 08/26/2020) 90 tablet 1  . ketoconazole (NIZORAL) 2 % cream Apply 1 application topically daily. (Patient not taking: Reported on 08/26/2020) 15 g 0  . nystatin (MYCOSTATIN/NYSTOP) powder Apply 1 application topically 3 (three) times daily. (Patient not taking: Reported on 08/26/2020) 15 g 1   Current Facility-Administered Medications  Medication Dose Route Frequency Provider Last Rate Last Admin  . lidocaine (PF) (XYLOCAINE) 1 % injection 4 mL  4 mL Intradermal Once Virginia Crews, MD      .  lidocaine (PF) (XYLOCAINE) 1 % injection 4 mL  4 mL Intradermal Once Virginia Crews, MD      . methylPREDNISolone acetate (DEPO-MEDROL) injection 40 mg  40 mg Intramuscular Once Virginia Crews, MD      . methylPREDNISolone acetate (DEPO-MEDROL) injection 40 mg  40 mg Intramuscular Once Virginia Crews, MD         Review of Systems  Constitutional: Negative for chills, diaphoresis, fever, malaise/fatigue and weight loss (stable).       Feels "ok".  HENT: Positive for hearing loss (slight). Negative for congestion, ear discharge, ear pain, nosebleeds, sinus pain, sore throat and tinnitus.   Eyes: Negative.  Negative for blurred vision, double vision, photophobia and redness.  Respiratory: Negative for cough (resolved), hemoptysis, sputum production, shortness of breath and wheezing.   Cardiovascular: Negative.  Negative for chest pain, palpitations and leg swelling.  Gastrointestinal: Positive for constipation (takes miralax). Negative for abdominal pain, blood in stool, diarrhea,  heartburn, nausea and vomiting.  Genitourinary: Negative.  Negative for dysuria, frequency, hematuria and urgency.  Musculoskeletal: Negative for back pain (improving), falls, joint pain, myalgias and neck pain.  Skin: Negative.  Negative for itching and rash.  Neurological: Positive for dizziness (especially when walking). Negative for sensory change, speech change, focal weakness, weakness and headaches.       Parkinson's disease. Balance problems  Endo/Heme/Allergies: Does not bruise/bleed easily.  Psychiatric/Behavioral: Positive for memory loss. Negative for depression. The patient is not nervous/anxious and does not have insomnia.   All other systems reviewed and are negative.   Performance status (ECOG): 1  Physical Exam Nursing note reviewed.  Constitutional:      General: She is not in acute distress.    Appearance: She is well-developed. She is not diaphoretic.     Interventions: Face mask in place.  HENT:     Head: Normocephalic and atraumatic.     Comments: Shoulderlength dark blonde hair.    Mouth/Throat:     Mouth: Mucous membranes are moist.     Pharynx: Oropharynx is clear.  Eyes:     General: No scleral icterus.    Extraocular Movements: Extraocular movements intact.     Conjunctiva/sclera: Conjunctivae normal.     Pupils: Pupils are equal, round, and reactive to light.     Comments: Blue eyes.  Cardiovascular:     Rate and Rhythm: Normal rate and regular rhythm.     Pulses: Normal pulses.     Heart sounds: Normal heart sounds. No murmur heard.   Pulmonary:     Effort: Pulmonary effort is normal. No respiratory distress.     Breath sounds: Normal breath sounds. No wheezing or rales.  Chest:     Chest wall: No tenderness.     Breasts:        Right: Skin change (moisture under breast and pink in color, fibrocystic changes around 11 o'clock position and inferiorly) and tenderness present.        Left: Skin change (moistures under breast) present.  Abdominal:      General: Bowel sounds are normal. There is no distension.     Palpations: Abdomen is soft. There is no mass.     Tenderness: There is no abdominal tenderness. There is no guarding or rebound.     Hernia: A hernia is present.  Musculoskeletal:        General: No swelling or tenderness. Normal range of motion.     Cervical back: Normal range of motion and  neck supple.  Lymphadenopathy:     Head:     Right side of head: No preauricular, posterior auricular or occipital adenopathy.     Left side of head: No preauricular, posterior auricular or occipital adenopathy.     Cervical: No cervical adenopathy.     Upper Body:     Right upper body: No supraclavicular or axillary adenopathy.     Left upper body: No supraclavicular or axillary adenopathy.     Lower Body: No right inguinal adenopathy. No left inguinal adenopathy.  Skin:    General: Skin is warm and dry.  Neurological:     Mental Status: She is alert and oriented to person, place, and time. Mental status is at baseline.  Psychiatric:        Mood and Affect: Mood normal.        Behavior: Behavior normal.        Judgment: Judgment normal.     Appointment on 08/26/2020  Component Date Value Ref Range Status  . Sodium 08/26/2020 133* 135 - 145 mmol/L Final  . Potassium 08/26/2020 3.7  3.5 - 5.1 mmol/L Final  . Chloride 08/26/2020 99  98 - 111 mmol/L Final  . CO2 08/26/2020 25  22 - 32 mmol/L Final  . Glucose, Bld 08/26/2020 146* 70 - 99 mg/dL Final   Glucose reference range applies only to samples taken after fasting for at least 8 hours.  . BUN 08/26/2020 8  8 - 23 mg/dL Final  . Creatinine, Ser 08/26/2020 0.89  0.44 - 1.00 mg/dL Final  . Calcium 08/26/2020 8.9  8.9 - 10.3 mg/dL Final  . Total Protein 08/26/2020 6.5  6.5 - 8.1 g/dL Final  . Albumin 08/26/2020 3.8  3.5 - 5.0 g/dL Final  . AST 08/26/2020 15  15 - 41 U/L Final  . ALT 08/26/2020 7  0 - 44 U/L Final  . Alkaline Phosphatase 08/26/2020 46  38 - 126 U/L Final  .  Total Bilirubin 08/26/2020 0.4  0.3 - 1.2 mg/dL Final  . GFR, Estimated 08/26/2020 >60  >60 mL/min Final   Comment: (NOTE) Calculated using the CKD-EPI Creatinine Equation (2021)   . Anion gap 08/26/2020 9  5 - 15 Final   Performed at [facility], [address] [place], [place] [address]  . WBC 08/26/2020 4.1  4.0 - 10.5 K/uL Final  . RBC 08/26/2020 4.55  3.87 - 5.11 MIL/uL Final  . Hemoglobin 08/26/2020 12.9  12.0 - 15.0 g/dL Final  . HCT 08/26/2020 39.6  36 - 46 % Final  . MCV 08/26/2020 87.0  80.0 - 100.0 fL Final  . MCH 08/26/2020 28.4  26.0 - 34.0 pg Final  . MCHC 08/26/2020 32.6  30.0 - 36.0 g/dL Final  . RDW 08/26/2020 14.3  11.5 - 15.5 % Final  . Platelets 08/26/2020 217  150 - 400 K/uL Final  . nRBC 08/26/2020 0.0  0.0 - 0.2 % Final  . Neutrophils Relative % 08/26/2020 69  % Final  . Neutro Abs 08/26/2020 2.8  1.7 - 7.7 K/uL Final  . Lymphocytes Relative 08/26/2020 18  % Final  . Lymphs Abs 08/26/2020 0.8  0.7 - 4.0 K/uL Final  . Monocytes Relative 08/26/2020 9  % Final  . Monocytes Absolute 08/26/2020 0.4  0.1 - 1.0 K/uL Final  . Eosinophils Relative 08/26/2020 3  % Final  . Eosinophils Absolute 08/26/2020 0.1  0.0 - 0.5 K/uL Final  . Basophils Relative 08/26/2020 1  % Final  .  Basophils Absolute 08/26/2020 0.1  0.0 - 0.1 K/uL Final  . Immature Granulocytes 08/26/2020 0  % Final  . Abs Immature Granulocytes 08/26/2020 0.01  0.00 - 0.07 K/uL Final   Performed at [facility], [address], [place], [place] [address]    Assessment:  Kendra [name] is a 74 y.o. female with multi-focal stage IA right breast cancers/p right lumpectomy on 04/27/2018. Pathology of the right lateral lesionrevealed a 2.5 cm grade II invasive ductal carcinoma with intermediate grade DCIS. The medial lesionrevealed a 1.1 cm grade II invasive ductal carcinoma with intermediate grade DCIS. LVIDS was present. Margins were negative. Zero of 2 lymph nodes were positive.  Both tumors were ER + (100%), PR + (70%), and HER-2 negative. Ki-67 was 10%. Pathologic stage was mT2N0.  Oncotype DX revealed a recurrence score of 12 which translated to a distant recurrence of 3% at 9 years with an AI or tamxifen alone. The absolute benefit of chemotherpay was <1%.  Invitae testingon 03/23/2018 revealed a variant of uncertain significance in MSH6.   She received breast radiationfrom 06/30/2018 - 07/20/2018. She began [facility] 09/06/2018. She is tolerating it well.  Bilateral diagnostic mammogram on 06/12/2019 showed no evidence of malignancy in either breast.  Bilateral mammogram and left axilla breast ultrasound on 06/12/2020 noted expected surgical changes at the lumpectomy site in the superior right breast.  A benign cyst was seen in the lateral aspect of the left breast. There was no evidence of malignancy.  CA27.29has been followed: 25.4 on 03/01/2019, 23.5 on 06/01/2019, 19.0 on 08/28/2019, 26.5 on 12/22/2019, 22.3 on 04/29/2020, and 21.0 on 08/26/2020.  She underwent exploratory laparotomy, bilateral salpingo-oophrectomy, and pelvic washings on 02/10/2018. 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 Peritoneal washings revealed atypical cells.   She has had elevated tumor markers.CA125has been followed (0-38.1):48.2on 01/26/2018, 11.3 on 10/24/2018, 7.9 on 04/29/2020, and 8.2 on 08/26/2020.CA19-9has been followed (0-35):322 on 01/26/2018,48 on 10/24/2018, 45 on 03/01/2019, 50 on 12/22/2019, 38 on 04/29/2020, and 33 on 08/26/2020.  Abdomen and pelvic CTon 02/04/2018 revealed a large (28.5 x 17 x 25 cm) cystic mass with enhancing septations and nodularity involving the majority of the abdomen. Abdomen and pelvic CTon 10/14/2018 revealed no acute findings. There was interval surgical resection of large cystic pelvic mass. There was no evidence of residual or metastatic neoplasm. There was suspected hepatic cirrhosis.  There were three midline ventral abdominal wall hernias (small epigastric ventral hernia, moderate paraumbilical hernia, and small suprapubic ventral hernia).   She has a history ofelevated LFTs. AST was 50 on 10/06/2018. Hepatitis B and C serologies were negative.  Bone densityon 06/28/2018 was normal with a T score of -0.8 in the right femoral neck.  Bone density on 06/12/2020 showed osteopenia with a T-score of -1.4 in the right femoral neck .   She has B12 deficiency. B12 was 232 on 08/09/2018 and 386 on 10/24/2018. She began B12 injectionson 08/15/2018.She receives B12 monthly at home.Anti-parietal antibody was elevated (23; 0-20) and intrinsic factor was normal on 10/24/2018.  Folate is 7.2 on 04/29/2020  She was diagnosed with Parkinson's and has been on Sinemet TID for about 2 months.  She has been more interactive, aware of the world, and has had more initiative.   She received her COVID-19 vaccines next year.    Symptomatically, she feels "ok". She continues to use MiraLax for constipation. She performs self breast exams monthly. She is using Nystatin powder for right breast moisture. She relies on her husband  for help. Exam is stable.  Plan: 1.   Lab today: CBC with diff, CMP, CA 27.29, CA 19-9, CA 125. 2.Multifocal stage IA RIGHT breast cancer Clinically, she continues to do well. Exam reveals no evidence of recurrent disease.  CA27.29 is normal today. Bilateral diagnostic mammogram on 06/12/2020 revealed no evidence of malignancy. Patient to continue Femara. 3. B12 deficiency Patient's cousin gives her B12 injections monthly.  She has pernicious anemia. Folate 7.2 on 04/29/2020.  Check folate annually. 4. Multilocular mucinous cystadenoma Benign pathology. Some atypical cells noted in the washings. Abdomen and pelvic CT scanswere  negative on 10/14/2018. She has a history of increased markers(CA19-9 and CA125).   CA19-9 is 33 (normal) today. Plan to pursue follow-up imaging if symptomatic or rising CA 19-9.    Continue to monitor. 5.   Candidal skin infection  Patient continues to have moist area under her breasts.  Nystatin powder as needed. 6.   Osteopenia  Review bone density from 06/12/2020.  Patient to take calcium 600 mg p.o. twice daily.  Consider Prolia as patient on Femara.   Information provided.   Discuss need for dental clearance.  Preauth Prolia. 7.   RTC in 6 months for MD assessment, labs (CBC with diff, CMP, CA 27-29, CA 19-9, CA-125) and +/- Prolia.  I discussed the assessment and treatment plan with the patient.  The patient was provided an opportunity to ask questions and all were answered.  The patient agreed with the plan and demonstrated an understanding of the instructions.  The patient was advised to call back or seek an in person evaluation if the symptoms worsen or if the condition fails to improve as anticipated.  I provided 20 minutes of face-to-face time during this encounter and > 50% was spent counseling as documented under my assessment and plan.  An additional 8-10 minutes were spent reviewing hER chart (Epic and Care Everywhere) including notes, labs, and imaging studies.    Nolon Stalls, MD, PhD  08/26/2020, 11:13 AM  I, Selena Batten, am acting as scribe for [employer]. Mike Gip, MD, PhD.   I, Melissa C. Mike Gip, MD, have reviewed the above documentation for accuracy and completeness, and I agree with the above.

## 2020-08-26 ENCOUNTER — Inpatient Hospital Stay: Payer: Medicare PPO | Attending: Hematology and Oncology | Admitting: Hematology and Oncology

## 2020-08-26 ENCOUNTER — Encounter: Payer: Self-pay | Admitting: Hematology and Oncology

## 2020-08-26 ENCOUNTER — Inpatient Hospital Stay: Payer: Medicare PPO

## 2020-08-26 ENCOUNTER — Ambulatory Visit: Payer: Medicare PPO

## 2020-08-26 ENCOUNTER — Other Ambulatory Visit: Payer: Self-pay

## 2020-08-26 VITALS — BP 123/63 | HR 89 | Temp 98.2°F | Wt 199.8 lb

## 2020-08-26 DIAGNOSIS — Z170 Estrogen receptor positive status [ER+]: Secondary | ICD-10-CM | POA: Diagnosis not present

## 2020-08-26 DIAGNOSIS — C50411 Malignant neoplasm of upper-outer quadrant of right female breast: Secondary | ICD-10-CM | POA: Insufficient documentation

## 2020-08-26 DIAGNOSIS — B372 Candidiasis of skin and nail: Secondary | ICD-10-CM | POA: Diagnosis not present

## 2020-08-26 DIAGNOSIS — M85851 Other specified disorders of bone density and structure, right thigh: Secondary | ICD-10-CM

## 2020-08-26 DIAGNOSIS — D279 Benign neoplasm of unspecified ovary: Secondary | ICD-10-CM

## 2020-08-26 DIAGNOSIS — C50211 Malignant neoplasm of upper-inner quadrant of right female breast: Secondary | ICD-10-CM

## 2020-08-26 DIAGNOSIS — E538 Deficiency of other specified B group vitamins: Secondary | ICD-10-CM

## 2020-08-26 DIAGNOSIS — R978 Other abnormal tumor markers: Secondary | ICD-10-CM | POA: Insufficient documentation

## 2020-08-26 LAB — CBC WITH DIFFERENTIAL/PLATELET
Abs Immature Granulocytes: 0.01 10*3/uL (ref 0.00–0.07)
Basophils Absolute: 0.1 10*3/uL (ref 0.0–0.1)
Basophils Relative: 1 %
Eosinophils Absolute: 0.1 10*3/uL (ref 0.0–0.5)
Eosinophils Relative: 3 %
HCT: 39.6 % (ref 36.0–46.0)
Hemoglobin: 12.9 g/dL (ref 12.0–15.0)
Immature Granulocytes: 0 %
Lymphocytes Relative: 18 %
Lymphs Abs: 0.8 10*3/uL (ref 0.7–4.0)
MCH: 28.4 pg (ref 26.0–34.0)
MCHC: 32.6 g/dL (ref 30.0–36.0)
MCV: 87 fL (ref 80.0–100.0)
Monocytes Absolute: 0.4 10*3/uL (ref 0.1–1.0)
Monocytes Relative: 9 %
Neutro Abs: 2.8 10*3/uL (ref 1.7–7.7)
Neutrophils Relative %: 69 %
Platelets: 217 10*3/uL (ref 150–400)
RBC: 4.55 MIL/uL (ref 3.87–5.11)
RDW: 14.3 % (ref 11.5–15.5)
WBC: 4.1 10*3/uL (ref 4.0–10.5)
nRBC: 0 % (ref 0.0–0.2)

## 2020-08-26 LAB — COMPREHENSIVE METABOLIC PANEL
ALT: 7 U/L (ref 0–44)
AST: 15 U/L (ref 15–41)
Albumin: 3.8 g/dL (ref 3.5–5.0)
Alkaline Phosphatase: 46 U/L (ref 38–126)
Anion gap: 9 (ref 5–15)
BUN: 8 mg/dL (ref 8–23)
CO2: 25 mmol/L (ref 22–32)
Calcium: 8.9 mg/dL (ref 8.9–10.3)
Chloride: 99 mmol/L (ref 98–111)
Creatinine, Ser: 0.89 mg/dL (ref 0.44–1.00)
GFR, Estimated: >60 mL/min (ref >60)
Glucose, Bld: 146 mg/dL — ABNORMAL HIGH (ref 70–99)
Potassium: 3.7 mmol/L (ref 3.5–5.1)
Sodium: 133 mmol/L — ABNORMAL LOW (ref 135–145)
Total Bilirubin: 0.4 mg/dL (ref 0.3–1.2)
Total Protein: 6.5 g/dL (ref 6.5–8.1)

## 2020-08-26 MED ORDER — LETROZOLE 2.5 MG PO TABS: 2.5000 mg | ORAL_TABLET | Freq: Every day | ORAL | 3 refills | Status: DC

## 2020-08-26 NOTE — Progress Notes (Signed)
No new changes noted today 

## 2020-08-26 NOTE — Patient Instructions (Addendum)
Take calcium 600 mg by mouth twice a day.  Need dental evaluation before Prolia.   Denosumab injection What is this medicine? DENOSUMAB (den oh sue mab) slows bone breakdown. Prolia is used to treat osteoporosis in women after menopause and in men, and in people who are taking corticosteroids for 6 months or more. Delton See is used to treat a high calcium level due to cancer and to prevent bone fractures and other bone problems caused by multiple myeloma or cancer bone metastases. Delton See is also used to treat giant cell tumor of the bone. This medicine may be used for other purposes; ask your health care provider or pharmacist if you have questions. COMMON BRAND NAME(S): Prolia, XGEVA What should I tell my health care provider before I take this medicine? They need to know if you have any of these conditions:  dental disease  having surgery or tooth extraction  infection  kidney disease  low levels of calcium or Vitamin D in the blood  malnutrition  on hemodialysis  skin conditions or sensitivity  thyroid or parathyroid disease  an unusual reaction to denosumab, other medicines, foods, dyes, or preservatives  pregnant or trying to get pregnant  breast-feeding How should I use this medicine? This medicine is for injection under the skin. It is given by a health care professional in a hospital or clinic setting. A special MedGuide will be given to you before each treatment. Be sure to read this information carefully each time. For Prolia, talk to your pediatrician regarding the use of this medicine in children. Special care may be needed. For Delton See, talk to your pediatrician regarding the use of this medicine in children. While this drug may be prescribed for children as young as 13 years for selected conditions, precautions do apply. Overdosage: If you think you have taken too much of this medicine contact a poison control center or emergency room at once. NOTE: This medicine is  only for you. Do not share this medicine with others. What if I miss a dose? It is important not to miss your dose. Call your doctor or health care professional if you are unable to keep an appointment. What may interact with this medicine? Do not take this medicine with any of the following medications:  other medicines containing denosumab This medicine may also interact with the following medications:  medicines that lower your chance of fighting infection  steroid medicines like prednisone or cortisone This list may not describe all possible interactions. Give your health care provider a list of all the medicines, herbs, non-prescription drugs, or dietary supplements you use. Also tell them if you smoke, drink alcohol, or use illegal drugs. Some items may interact with your medicine. What should I watch for while using this medicine? Visit your doctor or health care professional for regular checks on your progress. Your doctor or health care professional may order blood tests and other tests to see how you are doing. Call your doctor or health care professional for advice if you get a fever, chills or sore throat, or other symptoms of a cold or flu. Do not treat yourself. This drug may decrease your body's ability to fight infection. Try to avoid being around people who are sick. You should make sure you get enough calcium and vitamin D while you are taking this medicine, unless your doctor tells you not to. Discuss the foods you eat and the vitamins you take with your health care professional. See your dentist regularly. Brush and  floss your teeth as directed. Before you have any dental work done, tell your dentist you are receiving this medicine. Do not become pregnant while taking this medicine or for 5 months after stopping it. Talk with your doctor or health care professional about your birth control options while taking this medicine. Women should inform their doctor if they wish to  become pregnant or think they might be pregnant. There is a potential for serious side effects to an unborn child. Talk to your health care professional or pharmacist for more information. What side effects may I notice from receiving this medicine? Side effects that you should report to your doctor or health care professional as soon as possible:  allergic reactions like skin rash, itching or hives, swelling of the face, lips, or tongue  bone pain  breathing problems  dizziness  jaw pain, especially after dental work  redness, blistering, peeling of the skin  signs and symptoms of infection like fever or chills; cough; sore throat; pain or trouble passing urine  signs of low calcium like fast heartbeat, muscle cramps or muscle pain; pain, tingling, numbness in the hands or feet; seizures  unusual bleeding or bruising  unusually weak or tired Side effects that usually do not require medical attention (report to your doctor or health care professional if they continue or are bothersome):  constipation  diarrhea  headache  joint pain  loss of appetite  muscle pain  runny nose  tiredness  upset stomach This list may not describe all possible side effects. Call your doctor for medical advice about side effects. You may report side effects to FDA at 1-800-FDA-1088. Where should I keep my medicine? This medicine is only given in a clinic, doctor's office, or other health care setting and will not be stored at home. NOTE: This sheet is a summary. It may not cover all possible information. If you have questions about this medicine, talk to your doctor, pharmacist, or health care provider.  2020 Elsevier/Gold Standard (2018-02-25 16:10:44)

## 2020-08-27 ENCOUNTER — Ambulatory Visit (INDEPENDENT_AMBULATORY_CARE_PROVIDER_SITE_OTHER): Payer: Medicare PPO | Admitting: Licensed Clinical Social Worker

## 2020-08-27 ENCOUNTER — Encounter: Payer: Self-pay | Admitting: *Deleted

## 2020-08-27 DIAGNOSIS — F411 Generalized anxiety disorder: Secondary | ICD-10-CM

## 2020-08-27 DIAGNOSIS — F3176 Bipolar disorder, in full remission, most recent episode depressed: Secondary | ICD-10-CM

## 2020-08-27 LAB — CANCER ANTIGEN 19-9: CA 19-9: 33 U/mL (ref 0–35)

## 2020-08-27 LAB — CANCER ANTIGEN 27.29: CA 27.29: 21 U/mL (ref 0.0–38.6)

## 2020-08-27 LAB — CA 125: Cancer Antigen (CA) 125: 8.2 U/mL (ref 0.0–38.1)

## 2020-08-27 NOTE — Progress Notes (Signed)
Virtual Visit via Telephone Note  I connected with Kendra [name] on 08/27/20 at 12:30 PM EDT by telephone and verified that I am speaking with the correct person using two identifiers.  Location: Patient: home Provider: [facility]   I discussed the limitations, risks, security and privacy concerns of performing an evaluation and management service by telephone and the availability of in person appointments. I also discussed with the patient that there may be a patient responsible charge related to this service. The patient expressed understanding and agreed to proceed.  The patient was advised to call back or seek an in-person evaluation if the symptoms worsen or if the condition fails to improve as anticipated.  I provided  minutes of non-face-to-face time during this encounter.   [name] R Keean Wilmeth, LCSW   THERAPIST PROGRESS NOTE  Session Time: 12:30-1:30p  Participation Level: Active  Behavioral Response: NAAlertAnxious  Type of Therapy: Individual Therapy  Treatment Goals addressed: Anxiety and Coping  Interventions: Solution Focused, Supportive and Reframing  Summary: Kendra [name] is a 74 y.o. female who presents with symptoms related to diagnosis of generalized anxiety disorder. Pt reports specific fears of her husband leaving her home alone.  Allowed pt space to explore an express thoughts and feelings that trigger anxiety/stress.  Helped pt develop action plan to help manage feelings of uncertainty when husband is going out of town. Pt wrote down action plan steps.  Discussed pts feelings of "abandonment" when husband chooses to go out of town (going to watch World Series with brother in [place]).   Allowed pt to reflect on all of the activities that she and husband do together on a regular basis. Discussed importance of husband engaging in activities to help mitigate caregiver stress.   Allowed pt to explore her own perspective and perspective of husband.    Encouraged  pt to focus on self-care and safety.   Suicidal/Homicidal: No  SI, HI, or AVH reported at time of session.  Therapist Response: [name] reports a situational escalation in anxiety symptoms, which is reflective of fluctuating/intermittent progress.  Plan: Return again in 4 weeks. The ongoing treatment plan includes maintaining current levels of progress and continuing to build skills to manage mood, improve stress/anxiety management, emotion regulation, distress tolerance, and behavior modification.   Diagnosis: Axis I: Bipolar, Depressed and Generalized Anxiety Disorder    Axis II: No diagnosis    [name], LCSW 08/27/2020

## 2020-08-28 ENCOUNTER — Telehealth: Payer: Self-pay

## 2020-08-28 ENCOUNTER — Ambulatory Visit: Payer: Self-pay | Admitting: Family Medicine

## 2020-08-28 ENCOUNTER — Ambulatory Visit: Payer: Medicare PPO

## 2020-08-28 ENCOUNTER — Encounter: Payer: Self-pay | Admitting: Psychiatry

## 2020-08-28 ENCOUNTER — Other Ambulatory Visit: Payer: Self-pay

## 2020-08-28 ENCOUNTER — Telehealth (INDEPENDENT_AMBULATORY_CARE_PROVIDER_SITE_OTHER): Payer: Medicare PPO | Admitting: Psychiatry

## 2020-08-28 DIAGNOSIS — M6281 Muscle weakness (generalized): Secondary | ICD-10-CM | POA: Diagnosis not present

## 2020-08-28 DIAGNOSIS — F3176 Bipolar disorder, in full remission, most recent episode depressed: Secondary | ICD-10-CM | POA: Diagnosis not present

## 2020-08-28 DIAGNOSIS — F411 Generalized anxiety disorder: Secondary | ICD-10-CM | POA: Diagnosis not present

## 2020-08-28 DIAGNOSIS — F5105 Insomnia due to other mental disorder: Secondary | ICD-10-CM

## 2020-08-28 DIAGNOSIS — R262 Difficulty in walking, not elsewhere classified: Secondary | ICD-10-CM | POA: Diagnosis not present

## 2020-08-28 MED ORDER — RISPERIDONE 2 MG PO TABS: 1.0000 mg | ORAL_TABLET | Freq: Two times a day (BID) | ORAL | 3 refills | Status: DC

## 2020-08-28 NOTE — Progress Notes (Addendum)
Subjective:   Kendra [name] is a 74 y.o. female who presents for Medicare Annual (Subsequent) preventive examination.  Review of Systems    N/A  Cardiac Risk Factors include: advanced age (>55men, >64 women);hypertension;obesity (BMI >30kg/m2)     Objective:    Today's Vitals   08/29/20 1029  BP: 122/66  Pulse: 82  Temp: 98.4 F (36.9 C)  TempSrc: Oral  SpO2: 96%  Weight: 202 lb 6.4 oz (91.8 kg)  Height: 5\' 6"  (1.676 m)  PainSc: 3   PainLoc: Ear   Body mass index is 32.67 kg/m.  Advanced Directives 08/29/2020 08/26/2020 04/29/2020 01/30/2020 01/30/2020 12/25/2019 09/20/2019  Does Patient Have a Medical Advance Directive? Yes No Yes Yes Yes Yes Yes  Type of Paramedic of [place];Living will - [place];Living will [place];Living will - [place];Living will [place];Living will  Does patient want to make changes to medical advance directive? - - No - Patient declined No - Patient declined - - No - Patient declined  Copy of [place] in Chart? Yes - validated most recent copy scanned in chart (See row information) - Yes - validated most recent copy scanned in chart (See row information) No - copy requested - Yes - validated most recent copy scanned in chart (See row information) No - copy requested  Would patient like information on creating a medical advance directive? - No - Patient declined No - Patient declined - - - -  Some encounter information is confidential and restricted. Go to Review Flowsheets activity to see all data.    Current Medications (verified) Outpatient Encounter Medications as of 08/29/2020  Medication Sig  . Acetaminophen 500 MG capsule Take 1,000 mg by mouth every 6 (six) hours as needed.   Marland Kitchen buPROPion (WELLBUTRIN XL) 150 MG 24 hr tablet Take 150 mg by mouth daily. Unsure dose  . calcium citrate-vitamin D (CITRACAL+D) 315-200  MG-UNIT tablet Take 4 tablets by mouth daily.  . cephALEXin (KEFLEX) 500 MG capsule Take 1 capsule (500 mg total) by mouth 3 (three) times daily.  . cyanocobalamin (,VITAMIN B-12,) 1000 MCG/ML injection Inject 1 mL (1,000 mcg total) into the muscle every 30 (thirty) days.  Marland Kitchen FLUoxetine HCl 60 MG TABS Take 60 mg by mouth daily.  Marland Kitchen ketoconazole (NIZORAL) 2 % cream Apply 1 application topically daily.  Marland Kitchen letrozole (FEMARA) 2.5 MG tablet Take 1 tablet (2.5 mg total) by mouth daily.  Marland Kitchen lisinopril-hydrochlorothiazide (ZESTORETIC) 20-12.5 MG tablet Take 1 tablet by mouth daily.  . naproxen sodium (ALEVE) 220 MG tablet Take 220 mg by mouth 2 (two) times daily as needed. (Patient not taking: Reported on 08/29/2020)  . nystatin (MYCOSTATIN/NYSTOP) powder Apply 1 application topically 3 (three) times daily.  . polyethylene glycol (MIRALAX / GLYCOLAX) 17 g packet Take 17 g by mouth daily.  . risperiDONE (RISPERDAL) 2 MG tablet Take 0.5 tablets (1 mg total) by mouth 2 (two) times daily.  . traZODone (DESYREL) 50 MG tablet Take 0.5-1 tablets (25-50 mg total) by mouth at bedtime as needed for sleep.  . carbidopa-levodopa (SINEMET IR) 25-100 MG tablet Take 2 tablets by mouth 4 (four) times daily.   . carbidopa-levodopa (SINEMET IR) 25-100 MG tablet Take 1 tablet by mouth at bedtime. (Patient not taking: Reported on 08/29/2020)  . [DISCONTINUED] risperiDONE (RISPERDAL) 2 MG tablet Take 0.5 tablets (1 mg total) by mouth 2 (two) times daily.   Facility-Administered Encounter Medications as  of 08/29/2020  Medication  . lidocaine (PF) (XYLOCAINE) 1 % injection 4 mL  . lidocaine (PF) (XYLOCAINE) 1 % injection 4 mL  . methylPREDNISolone acetate (DEPO-MEDROL) injection 40 mg  . methylPREDNISolone acetate (DEPO-MEDROL) injection 40 mg    Allergies (verified) Hydroxyzine, Other, and Shrimp [shellfish allergy]   History: Past Medical History:  Diagnosis Date  . Anxiety   . Breast cancer ([place]) 03/2018   right  breast cancer at 11:00 and 1:00  . Bursitis    bilateral hips and knees  . Complication of anesthesia   . Depression   . Family history of breast cancer 03/24/2018  . Family history of lung cancer 03/24/2018  . History of alcohol dependence ([place]) 2007   no ETOH; resolved since 2007  . History of hiatal hernia   . History of psychosis 2014   due to sleep disturbance  . Hypertension   . Parkinson disease ([place])   . Personal history of radiation therapy 06/2018-07/2018   right breast ca  . PONV (postoperative nausea and vomiting)    Past Surgical History:  Procedure Laterality Date  . BREAST BIOPSY Right 03/14/2018   11:00 DCIS and invasive ductal carcinoma  . BREAST BIOPSY Right 03/14/2018   1:00 Invasive ductal carcinoma  . BREAST LUMPECTOMY Right 04/27/2018   lumpectomy of 11 and 1:00 cancers, clear margins, negative LN  . BREAST LUMPECTOMY WITH RADIOACTIVE SEED AND SENTINEL LYMPH NODE BIOPSY Right 04/27/2018   Procedure: RIGHT BREAST LUMPECTOMY WITH RADIOACTIVE SEED X 2 AND RIGHT SENTINEL LYMPH NODE BIOPSY;  Surgeon: [name], MD;  Location: [facility];  Service: General;  Laterality: Right;  . CATARACT EXTRACTION W/PHACO Left 08/30/2019   Procedure: CATARACT EXTRACTION PHACO AND INTRAOCULAR LENS PLACEMENT ([place]) LEFT panoptix toric  01:20.5  12.7%  10.26;  Surgeon: Leandrew Koyanagi, MD;  Location: [facility];  Service: Ophthalmology;  Laterality: Left;  . CATARACT EXTRACTION W/PHACO Right 09/20/2019   Procedure: CATARACT EXTRACTION PHACO AND INTRAOCULAR LENS PLACEMENT ([facility]) RIGHT 5.71  00:36.4  15.7%;  Surgeon: Leandrew Koyanagi, MD;  Location: [facility];  Service: Ophthalmology;  Laterality: Right;  . LAPAROTOMY N/A 02/10/2018   Procedure: EXPLORATORY LAPAROTOMY;  Surgeon: Isabel Caprice, MD;  Location: [facility];  Service: Gynecology;  Laterality: N/A;  . MASS EXCISION  01/2018   abdominal  . OVARIAN CYST REMOVAL    .  SALPINGOOPHORECTOMY Bilateral 02/10/2018   Procedure: BILATERAL SALPINGO OOPHORECTOMY; PERITONEAL WASHINGS;  Surgeon: Isabel Caprice, MD;  Location: [facility];  Service: Gynecology;  Laterality: Bilateral;  . TONSILLECTOMY    . TONSILLECTOMY AND ADENOIDECTOMY  1953   Family History  Problem Relation Age of Onset  . Diabetes Mother   . Breast cancer Mother 53  . Hypertension Mother   . Lung cancer Father        asbestos exposure  . Heart disease Brother 64  . Breast cancer Cousin 57       paternal cousin  . Heart disease Paternal Grandmother 31  . Heart disease Paternal Grandfather    Social History   Socioeconomic History  . Marital status: Married    Spouse name: robert  . Number of children: 0  . Years of education: Not on file  . Highest education level: Some college, no degree  Occupational History  . Occupation: retired Furniture conservator/restorer of education  Tobacco Use  . Smoking status: Never Smoker  . Smokeless tobacco: Never Used  Vaping Use  . Vaping Use:  Never used  Substance and Sexual Activity  . Alcohol use: Not Currently    Comment: alcohol dependence prior to 2007  . Drug use: Never  . Sexual activity: Yes    Partners: Male    Birth control/protection: Surgical  Other Topics Concern  . Not on file  Social History Narrative  . Not on file   Social Determinants of Health   Financial Resource Strain: Low Risk   . Difficulty of Paying Living Expenses: Not hard at all  Food Insecurity: No Food Insecurity  . Worried About Charity fundraiser in the Last Year: Never true  . Ran Out of Food in the Last Year: Never true  Transportation Needs: No Transportation Needs  . Lack of Transportation (Medical): No  . Lack of Transportation (Non-Medical): No  Physical Activity: Inactive  . Days of Exercise per Week: 0 days  . Minutes of Exercise per Session: 0 min  Stress: Stress Concern Present  . Feeling of Stress : To some extent  Social Connections:  Moderately Integrated  . Frequency of Communication with Friends and Family: More than three times a week  . Frequency of Social Gatherings with Friends and Family: More than three times a week  . Attends Religious Services: Never  . Active Member of Clubs or Organizations: Yes  . Attends Archivist Meetings: 1 to 4 times per year  . Marital Status: Married    Tobacco Counseling Counseling given: Not Answered   Clinical Intake:  Pre-visit preparation completed: Yes  Pain : 0-10 Pain Score: 3  Pain Type: Acute pain Pain Location: Ear Pain Orientation: Right Pain Descriptors / Indicators: Sore Pain Frequency: Intermittent     Nutritional Status: BMI > 30  Obese Nutritional Risks: None Diabetes: No  How often do you need to have someone help you when you read instructions, pamphlets, or other written materials from your doctor or pharmacy?: 1 - Never  Diabetic? No  Interpreter Needed?: No  Information entered by :: [facility], LPN   Activities of Daily Living In your present state of health, do you have any difficulty performing the following activities: 08/29/2020  Hearing? Y  Comment due to right ear pain and ringing in ears.  Vision? N  Difficulty concentrating or making decisions? Y  Walking or climbing stairs? Y  Comment Due to balance issues from Parkinsons.  Dressing or bathing? N  Doing errands, shopping? Y  Comment Does not drive.  Preparing Food and eating ? N  Using the Toilet? N  In the past six months, have you accidently leaked urine? N  Do you have problems with loss of bowel control? N  Managing your Medications? N  Managing your Finances? N  Housekeeping or managing your Housekeeping? N  Some recent data might be hidden    Patient Care Team: Virginia Crews, MD as PCP - General (Family Medicine) Lequita Asal, MD as Consulting Physician (Hematology and Oncology) Noreene Filbert, MD as Referring Physician (Radiation  Oncology) Leandrew Koyanagi, MD as Referring Physician (Ophthalmology) Blythe Stanford, PT as Physical Therapist (Physical Therapy)  Indicate any recent Medical Services you may have received from other than [facility] providers in the past year (date may be approximate).     Assessment:   This is a routine wellness examination for Kendra [name].  Hearing/Vision screen No exam data present  Dietary issues and exercise activities discussed: Current Exercise Habits: Structured exercise class (Does PT twice weekly.), Type of exercise: stretching, Time (Minutes): 45, Frequency (  Times/Week): 2, Weekly Exercise (Minutes/Week): 90, Intensity: Mild, Exercise limited by: Other - see comments (Due to Parkinsons and balance issues.)  Goals    . LIFESTYLE - DECREASE FALLS RISK     Recommend to remove any items from the home that may cause slips or trips.      Depression Screen PHQ 2/9 Scores 08/29/2020 11/24/2018  PHQ - 2 Score 2 0  PHQ- 9 Score 6 3  Some encounter information is confidential and restricted. Go to Review Flowsheets activity to see all data.    Fall Risk Fall Risk  08/29/2020 08/29/2020 03/15/2019 11/24/2018 09/12/2018  Falls in the past year? 1 1 0 0 0  Number falls in past yr: 1 1 0 0 -  Injury with Fall? 1 0 - 0 -  Risk for fall due to : Other (Comment) - - - -  Risk for fall due to: Comment Due to Parkinsons Disease. - - - -  Follow up Falls prevention discussed Falls prevention discussed Falls evaluation completed - -    Any stairs in or around the home? Yes  If so, are there any without handrails? No  Home free of loose throw rugs in walkways, pet beds, electrical cords, etc? Yes  Adequate lighting in your home to reduce risk of falls? Yes   ASSISTIVE DEVICES UTILIZED TO PREVENT FALLS:  Life alert? No  Use of a cane, walker or w/c? No  Grab bars in the bathroom? No  Shower chair or bench in shower? Yes  Elevated toilet seat or a handicapped toilet? No   TIMED UP AND  GO:  Was the test performed? Yes .  Length of time to ambulate 10 feet: 12 sec.   Gait slow and steady without use of assistive device  Cognitive Function:     6CIT Screen 08/24/2019  What Year? 0 points  What month? 0 points  What time? 0 points  Count back from 20 0 points  Months in reverse 2 points  Repeat phrase 6 points  Total Score 8    Immunizations Immunization History  Administered Date(s) Administered  . Fluad Quad(high Dose 65+) 07/19/2019, 08/29/2020  . Unspecified SARS-COV-2 Vaccination 12/15/2019, 01/12/2020    TDAP status: Due, Education has been provided regarding the importance of this vaccine. Advised may receive this vaccine at local pharmacy or Health Dept. Aware to provide a copy of the vaccination record if obtained from local pharmacy or Health Dept. Verbalized acceptance and understanding. Flu Vaccine status: Completed at today's visit Pneumococcal vaccine status: Declined,  Education has been provided regarding the importance of this vaccine but patient still declined. Advised may receive this vaccine at local pharmacy or Health Dept. Aware to provide a copy of the vaccination record if obtained from local pharmacy or Health Dept. Verbalized acceptance and understanding.  Covid-19 vaccine status: Completed vaccines  Qualifies for Shingles Vaccine? Yes   Zostavax completed No   Shingrix Completed?: No.    Education has been provided regarding the importance of this vaccine. Patient has been advised to call insurance company to determine out of pocket expense if they have not yet received this vaccine. Advised may also receive vaccine at local pharmacy or Health Dept. Verbalized acceptance and understanding.  Screening Tests Health Maintenance  Topic Date Due  . PNA vac Low Risk Adult (1 of 2 - PCV13) Never done  . Fecal DNA (Cologuard)  06/28/2020  . TETANUS/TDAP  08/29/2021 (Originally 08/20/1965)  . MAMMOGRAM  06/12/2022  . DEXA  SCAN  06/12/2025   . INFLUENZA VACCINE  Completed  . COVID-19 Vaccine  Completed  . Hepatitis C Screening  Completed    Health Maintenance  Health Maintenance Due  Topic Date Due  . PNA vac Low Risk Adult (1 of 2 - PCV13) Never done  . Fecal DNA (Cologuard)  06/28/2020    Colorectal cancer screening: Cologuard ordered today.  Mammogram status: Completed 06/12/20. Repeat every year Bone Density status: Completed 06/12/20. Results reflect: Bone density results: OSTEOPENIA. Repeat every 5 years.  Lung Cancer Screening: (Low Dose CT Chest recommended if Age 74-80 years, 30 pack-year currently smoking OR have quit w/in 15years.) does not qualify.   Additional Screening:  Hepatitis C Screening: Up to date  Vision Screening: Recommended annual ophthalmology exams for early detection of glaucoma and other disorders of the eye. Is the patient up to date with their annual eye exam?  Yes  Who is the provider or what is the name of the office in which the patient attends annual eye exams? Dr Wallace Going @ [facility] If pt is not established with a provider, would they like to be referred to a provider to establish care? No .   Dental Screening: Recommended annual dental exams for proper oral hygiene  Community Resource Referral / Chronic Care Management: CRR required this visit?  No   CCM required this visit?  No      Plan:     I have personally reviewed and noted the following in the patient's chart:   . Medical and social history . Use of alcohol, tobacco or illicit drugs  . Current medications and supplements . Functional ability and status . Nutritional status . Physical activity . Advanced directives . List of other physicians . Hospitalizations, surgeries, and ER visits in previous 12 months . Vitals . Screenings to include cognitive, depression, and falls . Referrals and appointments  In addition, I have reviewed and discussed with patient certain preventive protocols, quality metrics, and  best practice recommendations. A written personalized care plan for preventive services as well as general preventive health recommendations were provided to patient.     Kendra [name] [place], [place]   11/10/3233   Nurse Notes: Pt declined a Prevnar 13 vaccine today. Cologuard order placed.

## 2020-08-28 NOTE — Progress Notes (Signed)
Virtual Visit via Telephone Note  I connected with Kendra [name] on 08/28/20 at  2:40 PM EDT by telephone and verified that I am speaking with the correct person using two identifiers.  Location Provider Location : [facility] Patient Location : Home  Participants: Patient , Provider    I discussed the limitations, risks, security and privacy concerns of performing an evaluation and management service by telephone and the availability of in person appointments. I also discussed with the patient that there may be a patient responsible charge related to this service. The patient expressed understanding and agreed to proceed.      I discussed the assessment and treatment plan with the patient. The patient was provided an opportunity to ask questions and all were answered. The patient agreed with the plan and demonstrated an understanding of the instructions.   The patient was advised to call back or seek an in-person evaluation if the symptoms worsen or if the condition fails to improve as anticipated.   [facility] MD OP Progress Note  08/28/2020 3:04 PM Kendra [name]  MRN:  [id number]  Chief Complaint:  Chief Complaint    Follow-up     HPI: Kendra [name] is a 74 year old Caucasian female, married, lives in [place], has a history of bipolar disorder, GAD, insomnia, cognitive disorder, multifocal stage I right breast cancer status post treatment-lumpectomy, ovarian cyst removal, Parkinson's disease was evaluated by phone today.  Patient today reports she is currently doing well with regards to her mood.  Denies any significant sadness.  Denies any anxiety symptoms.  She reports the medications is beneficial for her mood.  She denies any side effects to her medications including her risperidone.  She does have Parkinson's disease and is currently on medications and also in physical therapy.  She reports she goes for physical therapy 2 days a week, 45 minutes at a stretch.  She reports that  does help.  She had one fall recently.  She denies having any injuries from it.  Patient reports sleep is good on the trazodone.  The trazodone helps with her racing thoughts also.  Patient continues to have good support system from her husband.  Patient is currently in psychotherapy sessions and reports therapy sessions are beneficial.  She denies any suicidality, homicidality or perceptual disturbances.  Visit Diagnosis:    ICD-10-CM   1. Bipolar disorder, in full remission, most recent episode depressed ([place])  F31.76 risperiDONE (RISPERDAL) 2 MG tablet  2. GAD (generalized anxiety disorder)  F41.1   3. Insomnia due to mental condition  F51.05     Past Psychiatric History: I have reviewed past psychiatric history from my progress note on 03/01/2019.  Past trials of Prozac, risperidone, Seroquel, Ativan    Past Medical History:  Past Medical History:  Diagnosis Date  . Anxiety   . Breast cancer ([place]) 03/2018   right breast cancer at 11:00 and 1:00  . Bursitis    bilateral hips and knees  . Complication of anesthesia   . Depression   . Family history of breast cancer 03/24/2018  . Family history of lung cancer 03/24/2018  . History of alcohol dependence ([place]) 2007   no ETOH; resolved since 2007  . History of hiatal hernia   . History of psychosis 2014   due to sleep disturbance  . Hypertension   . Parkinson disease ([place])   . Personal history of radiation therapy 06/2018-07/2018   right breast ca  . PONV (postoperative nausea and vomiting)  Past Surgical History:  Procedure Laterality Date  . BREAST BIOPSY Right 03/14/2018   11:00 DCIS and invasive ductal carcinoma  . BREAST BIOPSY Right 03/14/2018   1:00 Invasive ductal carcinoma  . BREAST LUMPECTOMY Right 04/27/2018   lumpectomy of 11 and 1:00 cancers, clear margins, negative LN  . BREAST LUMPECTOMY WITH RADIOACTIVE SEED AND SENTINEL LYMPH NODE BIOPSY Right 04/27/2018   Procedure: RIGHT BREAST LUMPECTOMY WITH  RADIOACTIVE SEED X 2 AND RIGHT SENTINEL LYMPH NODE BIOPSY;  Surgeon: [name], MD;  Location: [facility];  Service: General;  Laterality: Right;  . CATARACT EXTRACTION W/PHACO Left 08/30/2019   Procedure: CATARACT EXTRACTION PHACO AND INTRAOCULAR LENS PLACEMENT ([place]) LEFT panoptix toric  01:20.5  12.7%  10.26;  Surgeon: Leandrew Koyanagi, MD;  Location: [facility];  Service: Ophthalmology;  Laterality: Left;  . CATARACT EXTRACTION W/PHACO Right 09/20/2019   Procedure: CATARACT EXTRACTION PHACO AND INTRAOCULAR LENS PLACEMENT ([facility]) RIGHT 5.71  00:36.4  15.7%;  Surgeon: Leandrew Koyanagi, MD;  Location: [facility];  Service: Ophthalmology;  Laterality: Right;  . LAPAROTOMY N/A 02/10/2018   Procedure: EXPLORATORY LAPAROTOMY;  Surgeon: Isabel Caprice, MD;  Location: [facility];  Service: Gynecology;  Laterality: N/A;  . MASS EXCISION  01/2018   abdominal  . OVARIAN CYST REMOVAL    . SALPINGOOPHORECTOMY Bilateral 02/10/2018   Procedure: BILATERAL SALPINGO OOPHORECTOMY; PERITONEAL WASHINGS;  Surgeon: Isabel Caprice, MD;  Location: [facility];  Service: Gynecology;  Laterality: Bilateral;  . TONSILLECTOMY    . TONSILLECTOMY AND ADENOIDECTOMY  1953    Family Psychiatric History: I have reviewed family psychiatric history from my progress note from 03/01/2019  Family History:  Family History  Problem Relation Age of Onset  . Diabetes Mother   . Breast cancer Mother 36  . Hypertension Mother   . Lung cancer Father        asbestos exposure  . Heart disease Brother 70  . Breast cancer Cousin 5       paternal cousin  . Heart disease Paternal Grandmother 53  . Heart disease Paternal Grandfather     Social History: Reviewed social history from my progress note on 03/01/2019 Social History   Socioeconomic History  . Marital status: Married    Spouse name: robert  . Number of children: 0  . Years of education: Not on file  . Highest education level:  Some college, no degree  Occupational History  . Occupation: retired Furniture conservator/restorer of education  Tobacco Use  . Smoking status: Never Smoker  . Smokeless tobacco: Never Used  Vaping Use  . Vaping Use: Never used  Substance and Sexual Activity  . Alcohol use: Not Currently    Comment: alcohol dependence prior to 2007  . Drug use: Never  . Sexual activity: Yes    Partners: Male    Birth control/protection: Surgical  Other Topics Concern  . Not on file  Social History Narrative  . Not on file   Social Determinants of Health   Financial Resource Strain:   . Difficulty of Paying Living Expenses: Not on file  Food Insecurity:   . Worried About Charity fundraiser in the Last Year: Not on file  . Ran Out of Food in the Last Year: Not on file  Transportation Needs:   . Lack of Transportation (Medical): Not on file  . Lack of Transportation (Non-Medical): Not on file  Physical Activity:   . Days of Exercise per Week: Not on  file  . Minutes of Exercise per Session: Not on file  Stress:   . Feeling of Stress : Not on file  Social Connections:   . Frequency of Communication with Friends and Family: Not on file  . Frequency of Social Gatherings with Friends and Family: Not on file  . Attends Religious Services: Not on file  . Active Member of Clubs or Organizations: Not on file  . Attends Archivist Meetings: Not on file  . Marital Status: Not on file    Allergies:  Allergies  Allergen Reactions  . Hydroxyzine Anaphylaxis    Tongue swollen  . Other Other (See Comments)    Food poisoning   . Shrimp [Shellfish Allergy] Other (See Comments)    Food poisoning     Metabolic Disorder Labs: Lab Results  Component Value Date   HGBA1C 5.4 04/13/2019   Lab Results  Component Value Date   PROLACTIN 101.9 06/20/2015   Lab Results  Component Value Date   CHOL 173 04/13/2019   TRIG 78 04/13/2019   HDL 62 04/13/2019   LDLCALC 95 04/13/2019   LDLCALC  103 06/02/2017   Lab Results  Component Value Date   TSH 2.31 04/13/2019   TSH 2.543 07/25/2018    Therapeutic Level Labs: No results found for: LITHIUM No results found for: VALPROATE No components found for:  CBMZ  Current Medications: Current Outpatient Medications  Medication Sig Dispense Refill  . FLUoxetine HCl 60 MG TABS Take 60 mg by mouth daily. 90 tablet 1  . risperiDONE (RISPERDAL) 2 MG tablet Take 0.5 tablets (1 mg total) by mouth 2 (two) times daily. 90 tablet 3  . traZODone (DESYREL) 50 MG tablet Take 0.5-1 tablets (25-50 mg total) by mouth at bedtime as needed for sleep. 90 tablet 1  . Acetaminophen 500 MG capsule  (Patient not taking: Reported on 04/29/2020)    . calcium citrate-vitamin D (CITRACAL+D) 315-200 MG-UNIT tablet Take 4 tablets by mouth daily.    . carbidopa-levodopa (SINEMET IR) 25-100 MG tablet Take 2 tablets by mouth 4 (four) times daily.     . carbidopa-levodopa (SINEMET IR) 25-100 MG tablet Take 1 tablet by mouth at bedtime.    . cephALEXin (KEFLEX) 500 MG capsule Take 1 capsule (500 mg total) by mouth 3 (three) times daily. (Patient not taking: Reported on 04/29/2020) 15 capsule 0  . cyanocobalamin (,VITAMIN B-12,) 1000 MCG/ML injection Inject 1 mL (1,000 mcg total) into the muscle every 30 (thirty) days. 3 mL 3  . ketoconazole (NIZORAL) 2 % cream Apply 1 application topically daily. (Patient not taking: Reported on 08/26/2020) 15 g 0  . letrozole (FEMARA) 2.5 MG tablet Take 1 tablet (2.5 mg total) by mouth daily. 90 tablet 3  . lisinopril-hydrochlorothiazide (ZESTORETIC) 20-12.5 MG tablet Take 1 tablet by mouth daily. 90 tablet 3  . nystatin (MYCOSTATIN/NYSTOP) powder Apply 1 application topically 3 (three) times daily. (Patient not taking: Reported on 08/26/2020) 15 g 1  . polyethylene glycol (MIRALAX / GLYCOLAX) 17 g packet Take 17 g by mouth daily.     Current Facility-Administered Medications  Medication Dose Route Frequency Provider Last Rate Last  Admin  . lidocaine (PF) (XYLOCAINE) 1 % injection 4 mL  4 mL Intradermal Once Virginia Crews, MD      . lidocaine (PF) (XYLOCAINE) 1 % injection 4 mL  4 mL Intradermal Once Virginia Crews, MD      . methylPREDNISolone acetate (DEPO-MEDROL) injection 40 mg  40 mg Intramuscular  Once Virginia Crews, MD      . methylPREDNISolone acetate (DEPO-MEDROL) injection 40 mg  40 mg Intramuscular Once Brita Romp Dionne Bucy, MD         Musculoskeletal: Strength & Muscle Tone: [place]: UTA Patient leans: N/A  Psychiatric Specialty Exam: Review of Systems  HENT: Positive for tinnitus.   Neurological: Positive for tremors.  Psychiatric/Behavioral: Negative for agitation, behavioral problems, confusion, decreased concentration, dysphoric mood, hallucinations, self-injury, sleep disturbance and suicidal ideas. The patient is not nervous/anxious and is not hyperactive.   All other systems reviewed and are negative.   There were no vitals taken for this visit.There is no height or weight on file to calculate BMI.  General Appearance: UTA  Eye Contact:  UTA  Speech:  Clear and Coherent  Volume:  Normal  Mood:  Euthymic  Affect:  UTA  Thought Process:  Goal Directed and Descriptions of Associations: Intact  Orientation:  Full (Time, Place, and Person)  Thought Content: Logical   Suicidal Thoughts:  No  Homicidal Thoughts:  No  Memory:  Immediate;   Fair Recent;   Fair Remote;   Fair  Judgement:  Fair  Insight:  Fair  Psychomotor Activity:  UTA  Concentration:  Concentration: Fair and Attention Span: Fair  Recall:  [employer] of Knowledge: Fair  Language: Fair  Akathisia:  No  Handed:  Right  AIMS (if indicated: UTA  Assets:  Communication Skills Desire for Improvement Housing Intimacy Social Support  ADL's:  Intact  Cognition: WNL  Sleep:  Fair   Screenings: GAD-7     Counselor 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 [facility]  Total GAD-7 Score 7     PHQ2-9     Counselor 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 [facility] Office Visit from 11/24/2018 in [facility]  PHQ-2 Total Score 3 0  PHQ-9 Total Score 17 3       Assessment and Plan: Kendra [name] is a 74 year old Caucasian female, married, lives in [place], has a history of bipolar disorder, GAD, insomnia, multiple medical problems, recent diagnosis of primary Parkinson's disease was evaluated by phone today.  Patient is biologically predisposed given her multiple health issues.  Patient is currently stable on current medication regimen.  Plan as noted below.  Plan Bipolar disorder in remission Risperidone at reduced dosage of 2 mg p.o. daily. Prozac 60 mg p.o. daily  GAD-stable Continue CBT as needed. Continue Prozac 60 mg p.o. daily  Insomnia-stable Trazodone 25 to 50 mg p.o. nightly as needed Continue melatonin at bedtime as needed.  For cognitive disorder-continue follow-up with neurology.  Follow-up in clinic in 1 to 2 months or sooner if needed.  I have spent atleast 20 minutes non face to face with patient today. More than 50 % of the time was spent for preparing to see the patient ( e.g., review of test, records ), ordering medications and test ,psychoeducation and supportive psychotherapy and care coordination,as well as documenting clinical information in electronic health record. This note was generated in part or whole with voice recognition software. Voice recognition is usually quite accurate but there are transcription errors that can and very often do occur. I apologize for any typographical errors that were not detected and corrected.        Ursula Alert, MD 08/29/2020, 8:21 AM

## 2020-08-28 NOTE — Telephone Encounter (Signed)
received fax requesting 90 day supply of risperidone 2 mg

## 2020-08-28 NOTE — Telephone Encounter (Signed)
done

## 2020-08-29 ENCOUNTER — Encounter: Payer: Self-pay | Admitting: Family Medicine

## 2020-08-29 ENCOUNTER — Ambulatory Visit (INDEPENDENT_AMBULATORY_CARE_PROVIDER_SITE_OTHER): Payer: Medicare PPO

## 2020-08-29 ENCOUNTER — Other Ambulatory Visit: Payer: Self-pay

## 2020-08-29 ENCOUNTER — Ambulatory Visit (INDEPENDENT_AMBULATORY_CARE_PROVIDER_SITE_OTHER): Payer: Medicare PPO | Admitting: Family Medicine

## 2020-08-29 VITALS — BP 122/66 | HR 82 | Temp 98.4°F | Ht 66.0 in | Wt 202.4 lb

## 2020-08-29 VITALS — BP 122/66

## 2020-08-29 DIAGNOSIS — F322 Major depressive disorder, single episode, severe without psychotic features: Secondary | ICD-10-CM | POA: Diagnosis not present

## 2020-08-29 DIAGNOSIS — E669 Obesity, unspecified: Secondary | ICD-10-CM

## 2020-08-29 DIAGNOSIS — Z1211 Encounter for screening for malignant neoplasm of colon: Secondary | ICD-10-CM | POA: Diagnosis not present

## 2020-08-29 DIAGNOSIS — G20 Parkinson's disease: Secondary | ICD-10-CM

## 2020-08-29 DIAGNOSIS — E538 Deficiency of other specified B group vitamins: Secondary | ICD-10-CM | POA: Diagnosis not present

## 2020-08-29 DIAGNOSIS — I10 Essential (primary) hypertension: Secondary | ICD-10-CM | POA: Diagnosis not present

## 2020-08-29 DIAGNOSIS — F3130 Bipolar disorder, current episode depressed, mild or moderate severity, unspecified: Secondary | ICD-10-CM

## 2020-08-29 DIAGNOSIS — Z23 Encounter for immunization: Secondary | ICD-10-CM

## 2020-08-29 DIAGNOSIS — C50211 Malignant neoplasm of upper-inner quadrant of right female breast: Secondary | ICD-10-CM | POA: Diagnosis not present

## 2020-08-29 DIAGNOSIS — F1021 Alcohol dependence, in remission: Secondary | ICD-10-CM

## 2020-08-29 DIAGNOSIS — H6121 Impacted cerumen, right ear: Secondary | ICD-10-CM

## 2020-08-29 DIAGNOSIS — Z6832 Body mass index (BMI) 32.0-32.9, adult: Secondary | ICD-10-CM | POA: Diagnosis not present

## 2020-08-29 DIAGNOSIS — C50411 Malignant neoplasm of upper-outer quadrant of right female breast: Secondary | ICD-10-CM | POA: Diagnosis not present

## 2020-08-29 DIAGNOSIS — Z170 Estrogen receptor positive status [ER+]: Secondary | ICD-10-CM

## 2020-08-29 DIAGNOSIS — Z0000 Encounter for general adult medical examination without abnormal findings: Secondary | ICD-10-CM | POA: Diagnosis not present

## 2020-08-29 MED ORDER — LISINOPRIL-HYDROCHLOROTHIAZIDE 20-12.5 MG PO TABS
1.0000 | ORAL_TABLET | Freq: Every day | ORAL | 3 refills | Status: DC
Start: 2020-08-29 — End: 2021-10-09

## 2020-08-29 NOTE — Patient Instructions (Signed)
Kendra [name] , Thank you for taking time to come for your Medicare Wellness Visit. I appreciate your ongoing commitment to your health goals. Please review the following plan we discussed and let me know if I can assist you in the future.   Screening recommendations/referrals: Colonoscopy: Cologuard ordered today.  Mammogram: Up to date, due 06/2021 Bone Density: Up to date, due 06/2025 Recommended yearly ophthalmology/optometry visit for glaucoma screening and checkup Recommended yearly dental visit for hygiene and checkup  Vaccinations: Influenza vaccine: Administered today.  Pneumococcal vaccine: Prevnar 13 due. Patientt declined at this time.  Tdap vaccine: Currently due, declined at this time.  Shingles vaccine: Shingrix discussed. Please contact your pharmacy for coverage information.     Advanced directives: Currently on file.   Conditions/risks identified: Fall risk preventatives discussed today.   Next appointment: 11:00 AM today with Dr Brita Romp    Preventive Care 73 Years and Older, Female Preventive care refers to lifestyle choices and visits with your health care provider that can promote health and wellness. What does preventive care include?  A yearly physical exam. This is also called an annual well check.  Dental exams once or twice a year.  Routine eye exams. Ask your health care provider how often you should have your eyes checked.  Personal lifestyle choices, including:  Daily care of your teeth and gums.  Regular physical activity.  Eating a healthy diet.  Avoiding tobacco and drug use.  Limiting alcohol use.  Practicing safe sex.  Taking low-dose aspirin every day.  Taking vitamin and mineral supplements as recommended by your health care provider. What happens during an annual well check? The services and screenings done by your health care provider during your annual well check will depend on your age, overall health, lifestyle risk factors,  and family history of disease. Counseling  Your health care provider may ask you questions about your:  Alcohol use.  Tobacco use.  Drug use.  Emotional well-being.  Home and relationship well-being.  Sexual activity.  Eating habits.  History of falls.  Memory and ability to understand (cognition).  Work and work Statistician.  Reproductive health. Screening  You may have the following tests or measurements:  Height, weight, and BMI.  Blood pressure.  Lipid and cholesterol levels. These may be checked every 5 years, or more frequently if you are over 73 years old.  Skin check.  Lung cancer screening. You may have this screening every year starting at age 73 if you have a 30-pack-year history of smoking and currently smoke or have quit within the past 15 years.  Fecal occult blood test (FOBT) of the stool. You may have this test every year starting at age 73.  Flexible sigmoidoscopy or colonoscopy. You may have a sigmoidoscopy every 5 years or a colonoscopy every 10 years starting at age 73.  Hepatitis C blood test.  Hepatitis B blood test.  Sexually transmitted disease (STD) testing.  Diabetes screening. This is done by checking your blood sugar (glucose) after you have not eaten for a while (fasting). You may have this done every 1-3 years.  Bone density scan. This is done to screen for osteoporosis. You may have this done starting at age 73.  Mammogram. This may be done every 1-2 years. Talk to your health care provider about how often you should have regular mammograms. Talk with your health care provider about your test results, treatment options, and if necessary, the need for more tests. Vaccines  Your health care  provider may recommend certain vaccines, such as:  Influenza vaccine. This is recommended every year.  Tetanus, diphtheria, and acellular pertussis (Tdap, Td) vaccine. You may need a Td booster every 10 years.  Zoster vaccine. You may need  this after age 73.  Pneumococcal 13-valent conjugate (PCV13) vaccine. One dose is recommended after age 73.  Pneumococcal polysaccharide (PPSV23) vaccine. One dose is recommended after age 73. Talk to your health care provider about which screenings and vaccines you need and how often you need them. This information is not intended to replace advice given to you by your health care provider. Make sure you discuss any questions you have with your health care provider. Document Released: 11/15/2015 Document Revised: 07/08/2016 Document Reviewed: 08/20/2015 Elsevier Interactive Patient Education  2017 [place] Prevention in the Home Falls can cause injuries. They can happen to people of all ages. There are many things you can do to make your home safe and to help prevent falls. What can I do on the outside of my home?  Regularly fix the edges of walkways and driveways and fix any cracks.  Remove anything that might make you trip as you walk through a door, such as a raised step or threshold.  Trim any bushes or trees on the path to your home.  Use bright outdoor lighting.  Clear any walking paths of anything that might make someone trip, such as rocks or tools.  Regularly check to see if handrails are loose or broken. Make sure that both sides of any steps have handrails.  Any raised decks and porches should have guardrails on the edges.  Have any leaves, snow, or ice cleared regularly.  Use sand or salt on walking paths during winter.  Clean up any spills in your garage right away. This includes oil or grease spills. What can I do in the bathroom?  Use night lights.  Install grab bars by the toilet and in the tub and shower. Do not use towel bars as grab bars.  Use non-skid mats or decals in the tub or shower.  If you need to sit down in the shower, use a plastic, non-slip stool.  Keep the floor dry. Clean up any water that spills on the floor as soon as it  happens.  Remove soap buildup in the tub or shower regularly.  Attach bath mats securely with double-sided non-slip rug tape.  Do not have throw rugs and other things on the floor that can make you trip. What can I do in the bedroom?  Use night lights.  Make sure that you have a light by your bed that is easy to reach.  Do not use any sheets or blankets that are too big for your bed. They should not hang down onto the floor.  Have a firm chair that has side arms. You can use this for support while you get dressed.  Do not have throw rugs and other things on the floor that can make you trip. What can I do in the kitchen?  Clean up any spills right away.  Avoid walking on wet floors.  Keep items that you use a lot in easy-to-reach places.  If you need to reach something above you, use a strong step stool that has a grab bar.  Keep electrical cords out of the way.  Do not use floor polish or wax that makes floors slippery. If you must use wax, use non-skid floor wax.  Do not have throw  rugs and other things on the floor that can make you trip. What can I do with my stairs?  Do not leave any items on the stairs.  Make sure that there are handrails on both sides of the stairs and use them. Fix handrails that are broken or loose. Make sure that handrails are as long as the stairways.  Check any carpeting to make sure that it is firmly attached to the stairs. Fix any carpet that is loose or worn.  Avoid having throw rugs at the top or bottom of the stairs. If you do have throw rugs, attach them to the floor with carpet tape.  Make sure that you have a light switch at the top of the stairs and the bottom of the stairs. If you do not have them, ask someone to add them for you. What else can I do to help prevent falls?  Wear shoes that:  Do not have high heels.  Have rubber bottoms.  Are comfortable and fit you well.  Are closed at the toe. Do not wear sandals.  If you  use a stepladder:  Make sure that it is fully opened. Do not climb a closed stepladder.  Make sure that both sides of the stepladder are locked into place.  Ask someone to hold it for you, if possible.  Clearly mark and make sure that you can see:  Any grab bars or handrails.  First and last steps.  Where the edge of each step is.  Use tools that help you move around (mobility aids) if they are needed. These include:  Canes.  Walkers.  Scooters.  Crutches.  Turn on the lights when you go into a dark area. Replace any light bulbs as soon as they burn out.  Set up your furniture so you have a clear path. Avoid moving your furniture around.  If any of your floors are uneven, fix them.  If there are any pets around you, be aware of where they are.  Review your medicines with your doctor. Some medicines can make you feel dizzy. This can increase your chance of falling. Ask your doctor what other things that you can do to help prevent falls. This information is not intended to replace advice given to you by your health care provider. Make sure you discuss any questions you have with your health care provider. Document Released: 08/15/2009 Document Revised: 03/26/2016 Document Reviewed: 11/23/2014 Elsevier Interactive Patient Education  2017 [employer].

## 2020-08-29 NOTE — Progress Notes (Signed)
Complete physical exam   Patient: Kendra [name]   DOB: [birth year]   74 y.o. Female  MRN: [id number] Visit Date: 08/29/2020  Today's healthcare provider: Lavon Paganini, MD   Chief Complaint  Patient presents with  . Annual Exam   Subjective    Kendra [name] is a 74 y.o. female who presents today for a complete physical exam.  She reports consuming a general diet. PT twice a week She generally feels fairly well. She reports sleeping fairly well. She does have additional problems to discuss today.   Had AWV with [facility] today at 10:20pm.  HPI   She is concerned about her Parkinson's disease.  She is followed by neurology.  She is working with physical therapy.  She has had 2 falls in the last year.  This is limited her functionally.  She is taking the medications without side effects.  She had labs done earlier this week by oncology, including CBC, CMP.  Lipid panel has not been done in greater than 1 year  Past Medical History:  Diagnosis Date  . Anxiety   . Breast cancer ([place]) 03/2018   right breast cancer at 11:00 and 1:00  . Bursitis    bilateral hips and knees  . Complication of anesthesia   . Depression   . Family history of breast cancer 03/24/2018  . Family history of lung cancer 03/24/2018  . History of alcohol dependence ([place]) 2007   no ETOH; resolved since 2007  . History of hiatal hernia   . History of psychosis 2014   due to sleep disturbance  . Hypertension   . Parkinson disease ([place])   . Personal history of radiation therapy 06/2018-07/2018   right breast ca  . PONV (postoperative nausea and vomiting)    Past Surgical History:  Procedure Laterality Date  . BREAST BIOPSY Right 03/14/2018   11:00 DCIS and invasive ductal carcinoma  . BREAST BIOPSY Right 03/14/2018   1:00 Invasive ductal carcinoma  . BREAST LUMPECTOMY Right 04/27/2018   lumpectomy of 11 and 1:00 cancers, clear margins, negative LN  . BREAST LUMPECTOMY WITH RADIOACTIVE SEED AND  SENTINEL LYMPH NODE BIOPSY Right 04/27/2018   Procedure: RIGHT BREAST LUMPECTOMY WITH RADIOACTIVE SEED X 2 AND RIGHT SENTINEL LYMPH NODE BIOPSY;  Surgeon: [name], MD;  Location: [facility];  Service: General;  Laterality: Right;  . CATARACT EXTRACTION W/PHACO Left 08/30/2019   Procedure: CATARACT EXTRACTION PHACO AND INTRAOCULAR LENS PLACEMENT ([place]) LEFT panoptix toric  01:20.5  12.7%  10.26;  Surgeon: Leandrew Koyanagi, MD;  Location: [facility];  Service: Ophthalmology;  Laterality: Left;  . CATARACT EXTRACTION W/PHACO Right 09/20/2019   Procedure: CATARACT EXTRACTION PHACO AND INTRAOCULAR LENS PLACEMENT ([facility]) RIGHT 5.71  00:36.4  15.7%;  Surgeon: Leandrew Koyanagi, MD;  Location: [facility];  Service: Ophthalmology;  Laterality: Right;  . LAPAROTOMY N/A 02/10/2018   Procedure: EXPLORATORY LAPAROTOMY;  Surgeon: Isabel Caprice, MD;  Location: [facility];  Service: Gynecology;  Laterality: N/A;  . MASS EXCISION  01/2018   abdominal  . OVARIAN CYST REMOVAL    . SALPINGOOPHORECTOMY Bilateral 02/10/2018   Procedure: BILATERAL SALPINGO OOPHORECTOMY; PERITONEAL WASHINGS;  Surgeon: Isabel Caprice, MD;  Location: [facility];  Service: Gynecology;  Laterality: Bilateral;  . TONSILLECTOMY    . TONSILLECTOMY AND ADENOIDECTOMY  1953   Social History   Socioeconomic History  . Marital status: Married    Spouse name: robert  . Number of children: 0  .  Years of education: Not on file  . Highest education level: Some college, no degree  Occupational History  . Occupation: retired Furniture conservator/restorer of education  Tobacco Use  . Smoking status: Never Smoker  . Smokeless tobacco: Never Used  Vaping Use  . Vaping Use: Never used  Substance and Sexual Activity  . Alcohol use: Not Currently    Comment: alcohol dependence prior to 2007  . Drug use: Never  . Sexual activity: Yes    Partners: Male    Birth control/protection: Surgical  Other Topics  Concern  . Not on file  Social History Narrative  . Not on file   Social Determinants of Health   Financial Resource Strain: Low Risk   . Difficulty of Paying Living Expenses: Not hard at all  Food Insecurity: No Food Insecurity  . Worried About Charity fundraiser in the Last Year: Never true  . Ran Out of Food in the Last Year: Never true  Transportation Needs: No Transportation Needs  . Lack of Transportation (Medical): No  . Lack of Transportation (Non-Medical): No  Physical Activity: Inactive  . Days of Exercise per Week: 0 days  . Minutes of Exercise per Session: 0 min  Stress: Stress Concern Present  . Feeling of Stress : To some extent  Social Connections: Moderately Integrated  . Frequency of Communication with Friends and Family: More than three times a week  . Frequency of Social Gatherings with Friends and Family: More than three times a week  . Attends Religious Services: Never  . Active Member of Clubs or Organizations: Yes  . Attends Archivist Meetings: 1 to 4 times per year  . Marital Status: Married  Human resources officer Violence: Not At Risk  . Fear of Current or Ex-Partner: No  . Emotionally Abused: No  . Physically Abused: No  . Sexually Abused: No   Family Status  Relation Name Status  . Mother  Deceased  . Father  Deceased  . Brother  Deceased  . Cousin  (Not Specified)  . PGM  (Not Specified)  . PGF  (Not Specified)   Family History  Problem Relation Age of Onset  . Diabetes Mother   . Breast cancer Mother 88  . Hypertension Mother   . Lung cancer Father        asbestos exposure  . Heart disease Brother 103  . Breast cancer Cousin 7       paternal cousin  . Heart disease Paternal Grandmother 1  . Heart disease Paternal Grandfather    Allergies  Allergen Reactions  . Hydroxyzine Anaphylaxis    Tongue swollen  . Other Other (See Comments)    Food poisoning   . Shrimp [Shellfish Allergy] Other (See Comments)    Food poisoning       Patient Care Team: Virginia Crews, MD as PCP - General (Family Medicine) Lequita Asal, MD as Consulting Physician (Hematology and Oncology) Noreene Filbert, MD as Referring Physician (Radiation Oncology) Leandrew Koyanagi, MD as Referring Physician (Ophthalmology) Blythe Stanford, PT as Physical Therapist (Physical Therapy)   Medications: Outpatient Medications Prior to Visit  Medication Sig  . Acetaminophen 500 MG capsule Take 1,000 mg by mouth every 6 (six) hours as needed.   Marland Kitchen buPROPion (WELLBUTRIN XL) 150 MG 24 hr tablet Take 150 mg by mouth daily. Unsure dose  . calcium citrate-vitamin D (CITRACAL+D) 315-200 MG-UNIT tablet Take 4 tablets by mouth daily.  . carbidopa-levodopa (SINEMET IR) 25-100 MG  tablet Take 1 tablet by mouth at bedtime.   . cyanocobalamin (,VITAMIN B-12,) 1000 MCG/ML injection Inject 1 mL (1,000 mcg total) into the muscle every 30 (thirty) days.  Marland Kitchen FLUoxetine HCl 60 MG TABS Take 60 mg by mouth daily.  Marland Kitchen ketoconazole (NIZORAL) 2 % cream Apply 1 application topically daily.  Marland Kitchen letrozole (FEMARA) 2.5 MG tablet Take 1 tablet (2.5 mg total) by mouth daily.  Marland Kitchen lisinopril-hydrochlorothiazide (ZESTORETIC) 20-12.5 MG tablet Take 1 tablet by mouth daily.  Marland Kitchen nystatin (MYCOSTATIN/NYSTOP) powder Apply 1 application topically 3 (three) times daily.  . polyethylene glycol (MIRALAX / GLYCOLAX) 17 g packet Take 17 g by mouth daily.  . risperiDONE (RISPERDAL) 2 MG tablet Take 0.5 tablets (1 mg total) by mouth 2 (two) times daily.  . traZODone (DESYREL) 50 MG tablet Take 0.5-1 tablets (25-50 mg total) by mouth at bedtime as needed for sleep.  . carbidopa-levodopa (SINEMET IR) 25-100 MG tablet Take 2 tablets by mouth 4 (four) times daily.   . [DISCONTINUED] cephALEXin (KEFLEX) 500 MG capsule Take 1 capsule (500 mg total) by mouth 3 (three) times daily. (Patient not taking: Reported on 08/29/2020)   Facility-Administered Medications Prior to Visit  Medication Dose  Route Frequency Provider  . lidocaine (PF) (XYLOCAINE) 1 % injection 4 mL  4 mL Intradermal Once Virginia Crews, MD  . lidocaine (PF) (XYLOCAINE) 1 % injection 4 mL  4 mL Intradermal Once Virginia Crews, MD  . [DISCONTINUED] methylPREDNISolone acetate (DEPO-MEDROL) injection 40 mg  40 mg Intramuscular Once Virginia Crews, MD  . [DISCONTINUED] methylPREDNISolone acetate (DEPO-MEDROL) injection 40 mg  40 mg Intramuscular Once Virginia Crews, MD    Review of Systems  Constitutional: Negative.   HENT: Positive for ear pain and hearing loss. Negative for congestion, dental problem, drooling, ear discharge, facial swelling, mouth sores, nosebleeds, postnasal drip, rhinorrhea, sinus pressure, sinus pain, sneezing, sore throat, tinnitus, trouble swallowing and voice change.   Respiratory: Negative.   Cardiovascular: Negative.   Gastrointestinal: Negative.   Endocrine: Negative.   Musculoskeletal: Positive for arthralgias and gait problem.  Allergic/Immunologic: Negative.   Neurological: Positive for tremors and weakness.  Psychiatric/Behavioral: Negative.       Objective    BP 122/66     BP 122/66 (BP Location: Right Arm)  Pulse 82  Temp 98.4 F (36.9 C) (Oral)  Ht 5\' 6"  (1.676 m)  Wt 202 lb 6.4 oz (91.8 kg)  SpO2 96%  BMI 32.67 kg/m  BSA 2.07 m  Pain Sandwich 3       Physical Exam Vitals reviewed.  Constitutional:      General: She is not in acute distress.    Appearance: Normal appearance. She is well-developed. She is not diaphoretic.  HENT:     Head: Normocephalic and atraumatic.     Right Ear: Ear canal and external ear normal. There is impacted cerumen.     Left Ear: Tympanic membrane, ear canal and external ear normal.  Eyes:     General: No scleral icterus.    Conjunctiva/sclera: Conjunctivae normal.     Pupils: Pupils are equal, round, and reactive to light.  Neck:     Thyroid: No thyromegaly.  Cardiovascular:     Rate and Rhythm: Normal  rate and regular rhythm.     Pulses: Normal pulses.     Heart sounds: Normal heart sounds. No murmur heard.   Pulmonary:     Effort: Pulmonary effort is normal. No respiratory distress.  Breath sounds: Normal breath sounds. No wheezing or rales.  Abdominal:     General: There is no distension.     Palpations: Abdomen is soft.     Tenderness: There is no abdominal tenderness.  Musculoskeletal:     Cervical back: Neck supple.     Right lower leg: No edema.     Left lower leg: No edema.  Lymphadenopathy:     Cervical: No cervical adenopathy.  Skin:    General: Skin is warm and dry.     Findings: No rash.  Neurological:     Mental Status: She is alert and oriented to person, place, and time. Mental status is at baseline.     Sensory: No sensory deficit.     Gait: Gait abnormal (slow, shuffling).  Psychiatric:        Mood and Affect: Mood normal.        Behavior: Behavior normal.        Thought Content: Thought content normal.       Last depression screening scores PHQ 2/9 Scores 08/29/2020 11/24/2018  PHQ - 2 Score 2 0  PHQ- 9 Score 6 3  Some encounter information is confidential and restricted. Go to Review Flowsheets activity to see all data.   Last fall risk screening Fall Risk  08/29/2020  Falls in the past year? 1  Number falls in past yr: 1  Injury with Fall? 1  Risk for fall due to : Other (Comment)  Risk for fall due to: Comment Due to Parkinsons Disease.  Follow up Falls prevention discussed   Last Audit-C alcohol use screening Alcohol Use Disorder Test (AUDIT) 08/29/2020  1. How often do you have a drink containing alcohol? 0  2. How many drinks containing alcohol do you have on a typical day when you are drinking? 0  3. How often do you have six or more drinks on one occasion? 0  AUDIT-C Score 0  Alcohol Brief Interventions/Follow-up AUDIT Score <7 follow-up not indicated   A score of 3 or more in women, and 4 or more in men indicates increased risk for  alcohol abuse, EXCEPT if all of the points are from question 1   No results found for any visits on 08/29/20.  Assessment & Plan    Routine Health Maintenance and Physical Exam  Exercise Activities and Dietary recommendations Goals    . LIFESTYLE - DECREASE FALLS RISK     Recommend to remove any items from the home that may cause slips or trips.       Immunization History  Administered Date(s) Administered  . Fluad Quad(high Dose 65+) 07/19/2019, 08/29/2020  . Unspecified SARS-COV-2 Vaccination 12/15/2019, 01/12/2020    Health Maintenance  Topic Date Due  . PNA vac Low Risk Adult (1 of 2 - PCV13) Never done  . Fecal DNA (Cologuard)  06/28/2020  . TETANUS/TDAP  08/29/2021 (Originally 08/20/1965)  . MAMMOGRAM  06/12/2022  . DEXA SCAN  06/12/2025  . INFLUENZA VACCINE  Completed  . COVID-19 Vaccine  Completed  . Hepatitis C Screening  Completed    Discussed health benefits of physical activity, and encouraged her to engage in regular exercise appropriate for her age and condition.  Problem List Items Addressed This Visit      Cardiovascular and Mediastinum   Essential hypertension    Well controlled Continue current medications Reviewed recent metabolic panel F/u in 6 months       Relevant Medications   lisinopril-hydrochlorothiazide (ZESTORETIC) 20-12.5 MG tablet  Nervous and Auditory   Parkinson disease ([place])    Followed by neurology Continue sinemet PT and OT        Other   Malignant neoplasm of upper-outer quadrant of right breast in female, estrogen receptor positive ([place])    Status post right breast lumpectomy with sentinel lymph node biopsy as well as radiation and now on antiestrogen therapy Followed by oncology      Malignant neoplasm of upper-inner quadrant of right breast in female, estrogen receptor positive ([place])    Status post right breast lumpectomy with sentinel lymph node biopsy as well as radiation and now on antiestrogen  therapy Followed by oncology      B12 deficiency    Continue monthly injections at home Recheck levels in 6 months      MDD (major depressive disorder), severe ([place])    Followed by psych Seeing a therapist No changes to medications      Bipolar I disorder, most recent episode depressed ([place])    Followed by psych Also seeing therapist No changes to medications      Alcohol use disorder, moderate, in sustained remission ([facility])    Sustained remission      Class 1 obesity with serious comorbidity and body mass index (BMI) of 32.0 to 32.9 in adult    Discussed importance of healthy weight management Discussed diet and exercise  screenign lipid panel      Relevant Orders   Lipid panel    Other Visit Diagnoses    Encounter for annual physical exam    -  Primary   Relevant Orders   Lipid panel   Impacted cerumen of right ear       Need for pneumococcal vaccination       Relevant Orders   Pneumococcal polysaccharide vaccine 23-valent greater than or equal to 2yo subcutaneous/IM (Completed)      - impacted cerumen in right ear -Avoid using Q-tips as this can make it worse -Trial of Debrox drops -If not improving, can come in for [place] to flush it   Return in about 6 months (around 02/27/2021) for chronic disease f/u.     I, Lavon Paganini, MD, have reviewed all documentation for this visit. The documentation on 08/29/20 for the exam, diagnosis, procedures, and orders are all accurate and complete.   Nica Friske, Dionne Bucy, MD, MPH [facility]

## 2020-08-29 NOTE — Assessment & Plan Note (Signed)
Continue monthly injections at home Recheck levels in 6 months

## 2020-08-29 NOTE — Assessment & Plan Note (Signed)
Status post right breast lumpectomy with sentinel lymph node biopsy as well as radiation and now on antiestrogen therapy Followed by oncology

## 2020-08-29 NOTE — Assessment & Plan Note (Signed)
Well controlled Continue current medications Reviewed recent metabolic panel F/u in 6 months  

## 2020-08-29 NOTE — Therapy (Signed)
[facility] [address] [place], [address] Phone: [phone]   Fax:  [phone]  Physical Therapy Treatment  Patient Details  Name: Kendra [name] MRN: [id number] Date of Birth: 05/18/1946 No data recorded  Encounter Date: 08/28/2020   PT End of Session - 08/29/20 1046    Visit Number 64    Number of Visits 70    Date for PT Re-Evaluation 08/28/20    PT Start Time 1300    PT Stop Time 1345    PT Time Calculation (min) 45 min    Activity Tolerance Patient tolerated treatment well;No increased pain    Behavior During Therapy [facility] for tasks assessed/performed           Past Medical History:  Diagnosis Date   Anxiety    Breast cancer ([place]) 03/2018   right breast cancer at 11:00 and 1:00   Bursitis    bilateral hips and knees   Complication of anesthesia    Depression    Family history of breast cancer 03/24/2018   Family history of lung cancer 03/24/2018   History of alcohol dependence ([place]) 2007   no ETOH; resolved since 2007   History of hiatal hernia    History of psychosis 2014   due to sleep disturbance   Hypertension    Parkinson disease ([place])    Personal history of radiation therapy 06/2018-07/2018   right breast ca   PONV (postoperative nausea and vomiting)     Past Surgical History:  Procedure Laterality Date   BREAST BIOPSY Right 03/14/2018   11:00 DCIS and invasive ductal carcinoma   BREAST BIOPSY Right 03/14/2018   1:00 Invasive ductal carcinoma   BREAST LUMPECTOMY Right 04/27/2018   lumpectomy of 11 and 1:00 cancers, clear margins, negative LN   BREAST LUMPECTOMY WITH RADIOACTIVE SEED AND SENTINEL LYMPH NODE BIOPSY Right 04/27/2018   Procedure: RIGHT BREAST LUMPECTOMY WITH RADIOACTIVE SEED X 2 AND RIGHT SENTINEL LYMPH NODE BIOPSY;  Surgeon: [name], MD;  Location: [place];  Service: General;  Laterality: Right;   CATARACT  EXTRACTION W/PHACO Left 08/30/2019   Procedure: CATARACT EXTRACTION PHACO AND INTRAOCULAR LENS PLACEMENT ([place]) LEFT panoptix toric  01:20.5  12.7%  10.26;  Surgeon: Leandrew Koyanagi, MD;  Location: [place];  Service: Ophthalmology;  Laterality: Left;   CATARACT EXTRACTION W/PHACO Right 09/20/2019   Procedure: CATARACT EXTRACTION PHACO AND INTRAOCULAR LENS PLACEMENT ([facility]) RIGHT 5.71  00:36.4  15.7%;  Surgeon: Leandrew Koyanagi, MD;  Location: [place];  Service: Ophthalmology;  Laterality: Right;   LAPAROTOMY N/A 02/10/2018   Procedure: EXPLORATORY LAPAROTOMY;  Surgeon: Isabel Caprice, MD;  Location: [facility];  Service: Gynecology;  Laterality: N/A;   MASS EXCISION  01/2018   abdominal   OVARIAN CYST REMOVAL     SALPINGOOPHORECTOMY Bilateral 02/10/2018   Procedure: BILATERAL SALPINGO OOPHORECTOMY; PERITONEAL WASHINGS;  Surgeon: Isabel Caprice, MD;  Location: [facility];  Service: Gynecology;  Laterality: Bilateral;   TONSILLECTOMY     TONSILLECTOMY AND ADENOIDECTOMY  1953    There were no vitals filed for this visit.   Subjective Assessment - 08/28/20 1310    Subjective Patient reports a fall on her L side when ascending the stairs. Patient states she felt tired which resulted in weakness.    Pertinent History Patient report unclear regarding onset. Visited MD on Sept 16 and received injection with relief for approximately 1 month. Reports she cannot receive an additional  injection pending eye surgery. Received dx from MD of hip bursitis in B hips. Reports pain with walking, stanindg, sitting 5/10 current 7/10 worst 3/10 best, with temporary relief when she is sitting "just right" and with naproxen. Agg: activity. Cormorbidities: hx of breast cancer, HTN, MCI, insomnia, cataracts    Limitations Sitting;Standing;Walking;House hold activities    How long can you sit comfortably? Pain constant, reduced with neutral pelvic tilt    How long can you stand comfortably?  Pain constant, increases with time    How long can you walk comfortably? Pain constant, increases with time    Patient Stated Goals Be able to move without hurting, walk 1 hour    Currently in Pain? No/denies    Pain Onset More than a month ago                TREATMENT Neuromuscular re-ed Seated reach out, down, forward, backward - x 15 Stairs up/down without UE support - 2 x 3 (4 step stair case) Walking backwards with ball toss in 53ft circles - x4 Pad with balance stones underneath Busy backgrounds with backwards wlaking Walking backwards EC with focus on improving speed and large steps - 4 x 53ft Ambulation over varied surfaces with pad - 4 x 5 ft  Transfer from tall kneeling to standing - x 1    Performed to improve balance and vestibular-based symptoms    PT Education - 08/29/20 1041    Education provided Yes    Education Details form/technique with exercise    Person(s) Educated Patient    Methods Explanation;Demonstration    Comprehension Verbalized understanding;Returned demonstration            PT Short Term Goals - 09/12/19 1419      PT SHORT TERM GOAL #1   Title Patient will demonstrate complaince with HEP to speed recovery and reduce total number of visits.    Baseline HEP given    Time 2    Period Weeks    Status New    Target Date 09/27/19             PT Long Term Goals - 07/15/20 0927      PT LONG TERM GOAL #1   Title Patient will improve hip strength to 4/5 grossly to facilitate improved mobility with ADLs.    Baseline 3, 4-; 12/22 deferred to NV; 11/09/2019: 4-/5; 12/07/2019 L hip flexor 4, abd and ext 3+; 01/29/2020: 4/5 for most hip motions, 3+/5 for abd, ext; 03/20/2020: 4/5 for hip movements    Time 8    Period Weeks    Status Achieved      PT LONG TERM GOAL #2   Title Patient will demonstrate walking tolerance increased to 30 minutes without increase in symptoms to demonstrate improved ability to perform walking program for  cardiovascular health.    Baseline 10 minute tolerance (patient report); 12/22 10 minutes (per pt report); 11/09/2019: 7 min; 12/07/2019 unable to assess due to icy streets;  01/29/2020: 10 min; 03/20/2020 10 min; 05/14/2020: 10 min x 3 per day; 06/11/2020: 63min x 3 per day; 07/15/2020: 63min x 3 per day    Time 8    Period Weeks    Status On-going      PT LONG TERM GOAL #3   Title Patient will report worst pain of 3/10 during therapy to demonstrate reduced disability    Baseline Worst 7/10; 12/22 worst 9/10; 11/09/2019: 9/10; 12/07/2019 7/10; 01/29/2020: 7/10; 05/14/2020: 0/ 10  Time 8    Period Weeks    Status Achieved      PT LONG TERM GOAL #4   Title Patient will demonstrate ability to perform all bed mobility maneuvers without increase in pain for reduced disability with mobility and transfers with ADLs.    Baseline Reports pain with bed mobility supine <> sidelying L/R, sidelying to sitting EOB; 12/22 pt able to perform I without increased pain    Time 8    Period Weeks    Status Achieved      PT LONG TERM GOAL #5   Title Patient will improve 10MW speed to 1.0 m/sec to meet norms for minimal fall risk and full community ambulation.    Baseline 0.88 m/sec; 12/22 0.96 m/sec; 11/09/2019: deferred; 12/07/2019 0.90 m/sec; 01/29/2020: .9  m/sec; 03/20/2020: .91m/s; 05/14/2020: . 9 m/s; 06/11/2020: .35m/s; 07/15/2020: 1 m/s    Time 8    Period Weeks    Status Achieved      PT LONG TERM GOAL #6   Title Patient will increase 6MWT by 200 ft to demonstrate improvement in functional capacity for transition to cardiac walking program    Baseline 6MWT = 1195; 1/7/20201: deferred; 12/07/2019 1095; 01/29/2020: deferred secondary to fatigue; 03/20/2020: 1022ft; 07/15/2020: 1139ft    Time 8    Period Weeks    Status On-going      PT LONG TERM GOAL #7   Title Patient will improve her 5xsts to under 15 sec to indicate singificnat improvement in LE functional strength and decrease in fall risk.    Baseline 24.5 sec;  03/20/2020: 19.19 sec; 06/13/2020: 14. 92    Time 6    Period Weeks    Status Achieved      PT LONG TERM GOAL #8   Title Patient will improve FGA to 28/30 to indicate improvement in dynamic balance and improvement in stability.    Baseline 23/30; 06/11/2020: 25/30; 07/15/2020: 26/30    Time 6    Period Weeks    Status On-going                 Plan - 08/29/20 1047    Clinical Impression Statement Continued to focus on improving balance with backwards walking and with unstable surfaces. Address transfers from floor which patinet able to go through 90% of the movement without assistance, However requires assistance for the remaining 90 % of motion, this is most likely due to decreased LE strength and confidence. Patient will benefit from further skilled therapy to return to prior level of function.    Personal Factors and Comorbidities Age;Comorbidity 3+;Time since onset of injury/illness/exacerbation;Comorbidity 2    Comorbidities MCI, HTN, CA, cataracts    Examination-Activity Limitations Bend;Sit;Squat;Stairs;Stand;Sleep    Examination-Participation Restrictions Cleaning;Community Activity    Stability/Clinical Decision Making Evolving/Moderate complexity    Rehab Potential Fair    Clinical Impairments Affecting Rehab Potential MCI    PT Frequency 2x / week    PT Duration 6 weeks    PT Treatment/Interventions ADLs/Self Care Home Management;Therapeutic exercise;Patient/family education;Neuromuscular re-education;Therapeutic activities;Functional mobility training;Balance training;Manual techniques;Gait training;Stair training;Moist Heat;Electrical Stimulation;Cryotherapy;Taping;Dry needling;Energy conservation;Passive range of motion;Joint Manipulations    PT Next Visit Plan Progress glute therex, pelvic-hip dissociation; check and progress HEP with stretches    PT Home Exercise Plan lateral step ups, step ups, hip kickers ext/abd red theraband    Consulted and Agree with Plan of Care  Patient           Patient will benefit from skilled  therapeutic intervention in order to improve the following deficits and impairments:  Decreased range of motion, Postural dysfunction, Pain, Abnormal gait, Decreased activity tolerance, Decreased coordination, Decreased endurance, Decreased balance, Decreased mobility, Difficulty walking, Hypomobility, Increased muscle spasms, Impaired perceived functional ability, Decreased strength, Impaired flexibility  Visit Diagnosis: Muscle weakness (generalized)  Difficulty in walking, not elsewhere classified     Problem List Patient Active Problem List   Diagnosis Date Noted   Candidal skin infection 04/29/2020   Parkinson disease ([place]) 02/22/2020   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   Bipolar disorder, in full remission, most recent episode depressed ([place]) 11/08/2019   Avitaminosis D 08/24/2019   Insomnia due to mental condition 05/31/2019   Bipolar I disorder, most recent episode depressed ([place]) 05/31/2019   Mild cognitive disorder 05/31/2019   Alcohol use disorder, moderate, in sustained remission ([place]) 05/31/2019   Elevated tumor markers 05/27/2019   GAD (generalized anxiety disorder) 04/10/2019   Altered mental status 04/10/2019   MDD (major depressive disorder), severe ([place]) 01/31/2019   High serum carbohydrate antigen 19-9 (CA19-9) 12/01/2018   Adjustment disorder with anxious mood 11/24/2018   Essential hypertension 11/24/2018   B12 deficiency 10/17/2018   Incisional hernia, without obstruction or gangrene 09/12/2018   Genetic testing 04/07/2018   Family history of lung cancer 03/24/2018   Malignant neoplasm of upper-outer quadrant of right breast in female, estrogen receptor positive ([place]) 03/17/2018   Malignant neoplasm of upper-inner quadrant of right breast in female, estrogen receptor positive ([place]) 03/17/2018   History of alcohol dependence ([place]) 02/07/2018   History of psychosis 02/07/2018    History of insomnia 02/07/2018    Blythe Stanford, PT DPT 08/29/2020, 10:55 AM  [facility] [address] [place], [address] Phone: [phone]   Fax:  [phone]  Name: Matalynn Graff MRN: [id number] Date of Birth: 1946/04/05

## 2020-08-29 NOTE — Assessment & Plan Note (Signed)
Followed by psych Seeing a therapist No changes to medications

## 2020-08-29 NOTE — Assessment & Plan Note (Signed)
Discussed importance of healthy weight management Discussed diet and exercise  screenign lipid panel

## 2020-08-29 NOTE — Assessment & Plan Note (Signed)
Sustained remission 

## 2020-08-29 NOTE — Patient Instructions (Addendum)
The CDC recommends two doses of Shingrix (the shingles vaccine) separated by 2 to 6 months for adults age 73 years and older. I recommend checking with your insurance plan regarding coverage for this vaccine. You are at higher risk for shingles.    Try Debrox drops for ear wax  Preventive Care 65 Years and Older, Female Preventive care refers to lifestyle choices and visits with your health care provider that can promote health and wellness. This includes:  A yearly physical exam. This is also called an annual well check.  Regular dental and eye exams.  Immunizations.  Screening for certain conditions.  Healthy lifestyle choices, such as diet and exercise. What can I expect for my preventive care visit? Physical exam Your health care provider will check:  Height and weight. These may be used to calculate body mass index (BMI), which is a measurement that tells if you are at a healthy weight.  Heart rate and blood pressure.  Your skin for abnormal spots. Counseling Your health care provider may ask you questions about:  Alcohol, tobacco, and drug use.  Emotional well-being.  Home and relationship well-being.  Sexual activity.  Eating habits.  History of falls.  Memory and ability to understand (cognition).  Work and work Statistician.  Pregnancy and menstrual history. What immunizations do I need?  Influenza (flu) vaccine  This is recommended every year. Tetanus, diphtheria, and pertussis (Tdap) vaccine  You may need a Td booster every 10 years. Varicella (chickenpox) vaccine  You may need this vaccine if you have not already been vaccinated. Zoster (shingles) vaccine  You may need this after age 40. Pneumococcal conjugate (PCV13) vaccine  One dose is recommended after age 80. Pneumococcal polysaccharide (PPSV23) vaccine  One dose is recommended after age 10. Measles, mumps, and rubella (MMR) vaccine  You may need at least one dose of MMR if you  were born in 1957 or later. You may also need a second dose. Meningococcal conjugate (MenACWY) vaccine  You may need this if you have certain conditions. Hepatitis A vaccine  You may need this if you have certain conditions or if you travel or work in places where you may be exposed to hepatitis A. Hepatitis B vaccine  You may need this if you have certain conditions or if you travel or work in places where you may be exposed to hepatitis B. Haemophilus influenzae type b (Hib) vaccine  You may need this if you have certain conditions. You may receive vaccines as individual doses or as more than one vaccine together in one shot (combination vaccines). Talk with your health care provider about the risks and benefits of combination vaccines. What tests do I need? Blood tests  Lipid and cholesterol levels. These may be checked every 5 years, or more frequently depending on your overall health.  Hepatitis C test.  Hepatitis B test. Screening  Lung cancer screening. You may have this screening every year starting at age 53 if you have a 30-pack-year history of smoking and currently smoke or have quit within the past 15 years.  Colorectal cancer screening. All adults should have this screening starting at age 7 and continuing until age 45. Your health care provider may recommend screening at age 78 if you are at increased risk. You will have tests every 1-10 years, depending on your results and the type of screening test.  Diabetes screening. This is done by checking your blood sugar (glucose) after you have not eaten for a while (  fasting). You may have this done every 1-3 years.  Mammogram. This may be done every 1-2 years. Talk with your health care provider about how often you should have regular mammograms.  BRCA-related cancer screening. This may be done if you have a family history of breast, ovarian, tubal, or peritoneal cancers. Other tests  Sexually transmitted disease (STD)  testing.  Bone density scan. This is done to screen for osteoporosis. You may have this done starting at age 59. Follow these instructions at home: Eating and drinking  Eat a diet that includes fresh fruits and vegetables, whole grains, lean protein, and low-fat dairy products. Limit your intake of foods with high amounts of sugar, saturated fats, and salt.  Take vitamin and mineral supplements as recommended by your health care provider.  Do not drink alcohol if your health care provider tells you not to drink.  If you drink alcohol: ? Limit how much you have to 0-1 drink a day. ? Be aware of how much alcohol is in your drink. In the [place] one drink equals one 12 oz bottle of beer (355 mL), one 5 oz glass of wine (148 mL), or one 1 oz glass of hard liquor (44 mL). Lifestyle  Take daily care of your teeth and gums.  Stay active. Exercise for at least 30 minutes on 5 or more days each week.  Do not use any products that contain nicotine or tobacco, such as cigarettes, e-cigarettes, and chewing tobacco. If you need help quitting, ask your health care provider.  If you are sexually active, practice safe sex. Use a condom or other form of protection in order to prevent STIs (sexually transmitted infections).  Talk with your health care provider about taking a low-dose aspirin or statin. What's next?  Go to your health care provider once a year for a well check visit.  Ask your health care provider how often you should have your eyes and teeth checked.  Stay up to date on all vaccines. This information is not intended to replace advice given to you by your health care provider. Make sure you discuss any questions you have with your health care provider. Document Revised: 10/13/2018 Document Reviewed: 10/13/2018 Elsevier Patient Education  2020 [employer].

## 2020-08-29 NOTE — Assessment & Plan Note (Signed)
Followed by psych Also seeing therapist No changes to medications

## 2020-08-29 NOTE — Assessment & Plan Note (Signed)
Followed by neurology Continue sinemet PT and OT

## 2020-09-02 ENCOUNTER — Ambulatory Visit: Payer: Medicare PPO | Attending: Family Medicine

## 2020-09-02 ENCOUNTER — Telehealth: Payer: Self-pay

## 2020-09-02 ENCOUNTER — Other Ambulatory Visit: Payer: Self-pay

## 2020-09-02 DIAGNOSIS — M6281 Muscle weakness (generalized): Secondary | ICD-10-CM | POA: Diagnosis not present

## 2020-09-02 DIAGNOSIS — R262 Difficulty in walking, not elsewhere classified: Secondary | ICD-10-CM | POA: Diagnosis not present

## 2020-09-02 DIAGNOSIS — F5105 Insomnia due to other mental disorder: Secondary | ICD-10-CM

## 2020-09-02 MED ORDER — TRAZODONE HCL 50 MG PO TABS: 75.0000 mg | ORAL_TABLET | Freq: Every evening | ORAL | 1 refills | Status: DC | PRN

## 2020-09-02 NOTE — Telephone Encounter (Signed)
I have sent trazodone with increased dosage to pharmacy.

## 2020-09-02 NOTE — Telephone Encounter (Signed)
Medication management - Telephone call with pt's pharmacy two times and patient once after she left a message her [facility] was stating it was too early to fill her Trazodone medication. Manuela Schwartz, pharmacist reported pt stated she was running out early due to Dr. Shea Evans stating it was okay for her to go up to 2 a night of the 50 mg dosage.  This RN spoke to patient as she then stated she could take a 1/2 to 1 full tablet per night of the 50 mg dosage and agreed this was on her last provider note from 08/28/20.  During the second call back to pt's pharmacy, pharmacist reported it was too early to refill the medication as was last filled on 06/20/20 at the correct dosage of 1/2 to 1 a night.  Pharmacist reported they have had similar concerns in the past but could not fill this early unless approved by provider with a new order.  Agreed to send message to Dr. Shea Evans to question if she wanted to authorize an earlier refill or for patient to wait until she could fill at 90 days and pharmacist agreed with plan.

## 2020-09-03 ENCOUNTER — Ambulatory Visit: Payer: Self-pay | Admitting: General Surgery

## 2020-09-03 NOTE — Therapy (Signed)
[facility] [address], [place], [address] Phone: [phone]   Fax:  [phone]  Physical Therapy Treatment  Patient Details  Name: Kendra [name] MRN: [id number] Date of Birth: 1945-11-11 No data recorded  Encounter Date: 09/02/2020   PT End of Session - 09/02/20 1352    Visit Number 65    Number of Visits 70    Date for PT Re-Evaluation 08/28/20    PT Start Time 8338    PT Stop Time 1430    PT Time Calculation (min) 45 min    Activity Tolerance Patient tolerated treatment well;No increased pain    Behavior During Therapy WFL for tasks assessed/performed           Past Medical History:  Diagnosis Date  . Anxiety   . Breast cancer ([place]) 03/2018   right breast cancer at 11:00 and 1:00  . Bursitis    bilateral hips and knees  . Complication of anesthesia   . Depression   . Family history of breast cancer 03/24/2018  . Family history of lung cancer 03/24/2018  . History of alcohol dependence ([place]) 2007   no ETOH; resolved since 2007  . History of hiatal hernia   . History of psychosis 2014   due to sleep disturbance  . Hypertension   . Parkinson disease ([place])   . Personal history of radiation therapy 06/2018-07/2018   right breast ca  . PONV (postoperative nausea and vomiting)     Past Surgical History:  Procedure Laterality Date  . BREAST BIOPSY Right 03/14/2018   11:00 DCIS and invasive ductal carcinoma  . BREAST BIOPSY Right 03/14/2018   1:00 Invasive ductal carcinoma  . BREAST LUMPECTOMY Right 04/27/2018   lumpectomy of 11 and 1:00 cancers, clear margins, negative LN  . BREAST LUMPECTOMY WITH RADIOACTIVE SEED AND SENTINEL LYMPH NODE BIOPSY Right 04/27/2018   Procedure: RIGHT BREAST LUMPECTOMY WITH RADIOACTIVE SEED X 2 AND RIGHT SENTINEL LYMPH NODE BIOPSY;  Surgeon: [name], MD;  Location: [facility];  Service: General;  Laterality: Right;  . CATARACT  EXTRACTION W/PHACO Left 08/30/2019   Procedure: CATARACT EXTRACTION PHACO AND INTRAOCULAR LENS PLACEMENT ([place]) LEFT panoptix toric  01:20.5  12.7%  10.26;  Surgeon: Leandrew Koyanagi, MD;  Location: [facility];  Service: Ophthalmology;  Laterality: Left;  . CATARACT EXTRACTION W/PHACO Right 09/20/2019   Procedure: CATARACT EXTRACTION PHACO AND INTRAOCULAR LENS PLACEMENT ([facility]) RIGHT 5.71  00:36.4  15.7%;  Surgeon: Leandrew Koyanagi, MD;  Location: [facility];  Service: Ophthalmology;  Laterality: Right;  . LAPAROTOMY N/A 02/10/2018   Procedure: EXPLORATORY LAPAROTOMY;  Surgeon: Isabel Caprice, MD;  [facility];  Service: Gynecology;  Laterality: N/A;  . MASS EXCISION  01/2018   abdominal  . OVARIAN CYST REMOVAL    . SALPINGOOPHORECTOMY Bilateral 02/10/2018   Procedure: BILATERAL SALPINGO OOPHORECTOMY; PERITONEAL WASHINGS;  Surgeon: Isabel Caprice, MD;  [facility];  Service: Gynecology;  Laterality: Bilateral;  . TONSILLECTOMY    . TONSILLECTOMY AND ADENOIDECTOMY  1953    There were no vitals filed for this visit.   Subjective Assessment - 09/02/20 1350    Subjective Patient reports she feelss "wobbley" states no new falls since the previous session.    Pertinent History Patient report unclear regarding onset. Visited MD on Sept 16 and received injection with relief for approximately 1 month. Reports she cannot receive an additional injection pending eye surgery. Received dx from MD  of hip bursitis in B hips. Reports pain with walking, stanindg, sitting 5/10 current 7/10 worst 3/10 best, with temporary relief when she is sitting "just right" and with naproxen. Agg: activity. Cormorbidities: hx of breast cancer, HTN, MCI, insomnia, cataracts    Limitations Sitting;Standing;Walking;House hold activities    How long can you sit comfortably? Pain constant, reduced with neutral pelvic tilt    How long can you stand comfortably? Pain constant, increases with time     How long can you walk comfortably? Pain constant, increases with time    Patient Stated Goals Be able to move without hurting, walk 1 hour    Currently in Pain? No/denies    Pain Onset More than a month ago              TREATMENT Neuromuscular re-ed Seated reach out, down, forward, backward - x 15 Ambulation with focus on improving - 800 ft  Tandem ambulation - 4 x 67ft Head turns while walking forward - x 33 Backwards amb - x 68ft EC froward ambulation - x42ft Stairs up/down without UE support - 2 x 3 (4 step stair case) Walking backwards EC with focus on improving speed and large steps - 4 x 51ft   Performed to improve balance and vestibular-based symptoms      PT Education - 09/02/20 1351    Education provided Yes    Education Details form/technique with exercise    Person(s) Educated Patient    Methods Demonstration;Explanation    Comprehension Verbalized understanding;Returned demonstration            PT Short Term Goals - 09/12/19 1419      PT SHORT TERM GOAL #1   Title Patient will demonstrate complaince with HEP to speed recovery and reduce total number of visits.    Baseline HEP given    Time 2    Period Weeks    Status New    Target Date 09/27/19             PT Long Term Goals - 07/15/20 0927      PT LONG TERM GOAL #1   Title Patient will improve hip strength to 4/5 grossly to facilitate improved mobility with ADLs.    Baseline 3, 4-; 12/22 deferred to NV; 11/09/2019: 4-/5; 12/07/2019 L hip flexor 4, abd and ext 3+; 01/29/2020: 4/5 for most hip motions, 3+/5 for abd, ext; 03/20/2020: 4/5 for hip movements    Time 8    Period Weeks    Status Achieved      PT LONG TERM GOAL #2   Title Patient will demonstrate walking tolerance increased to 30 minutes without increase in symptoms to demonstrate improved ability to perform walking program for cardiovascular health.    Baseline 10 minute tolerance (patient report); 12/22 10 minutes (per pt report);  11/09/2019: 7 min; 12/07/2019 unable to assess due to icy streets;  01/29/2020: 10 min; 03/20/2020 10 min; 05/14/2020: 10 min x 3 per day; 06/11/2020: 45min x 3 per day; 07/15/2020: 42min x 3 per day    Time 8    Period Weeks    Status On-going      PT LONG TERM GOAL #3   Title Patient will report worst pain of 3/10 during therapy to demonstrate reduced disability    Baseline Worst 7/10; 12/22 worst 9/10; 11/09/2019: 9/10; 12/07/2019 7/10; 01/29/2020: 7/10; 05/14/2020: 0/ 10    Time 8    Period Weeks    Status Achieved  PT LONG TERM GOAL #4   Title Patient will demonstrate ability to perform all bed mobility maneuvers without increase in pain for reduced disability with mobility and transfers with ADLs.    Baseline Reports pain with bed mobility supine <> sidelying L/R, sidelying to sitting EOB; 12/22 pt able to perform I without increased pain    Time 8    Period Weeks    Status Achieved      PT LONG TERM GOAL #5   Title Patient will improve 10MW speed to 1.0 m/sec to meet norms for minimal fall risk and full community ambulation.    Baseline 0.88 m/sec; 12/22 0.96 m/sec; 11/09/2019: deferred; 12/07/2019 0.90 m/sec; 01/29/2020: .9  m/sec; 03/20/2020: .22m/s; 05/14/2020: . 9 m/s; 06/11/2020: .49m/s; 07/15/2020: 1 m/s    Time 8    Period Weeks    Status Achieved      PT LONG TERM GOAL #6   Title Patient will increase 6MWT by 200 ft to demonstrate improvement in functional capacity for transition to cardiac walking program    Baseline 6MWT = 1195; 1/7/20201: deferred; 12/07/2019 1095; 01/29/2020: deferred secondary to fatigue; 03/20/2020: 1076ft; 07/15/2020: 1138ft    Time 8    Period Weeks    Status On-going      PT LONG TERM GOAL #7   Title Patient will improve her 5xsts to under 15 sec to indicate singificnat improvement in LE functional strength and decrease in fall risk.    Baseline 24.5 sec; 03/20/2020: 19.19 sec; 06/13/2020: 14. 92    Time 6    Period Weeks    Status Achieved      PT LONG TERM GOAL  #8   Title Patient will improve FGA to 28/30 to indicate improvement in dynamic balance and improvement in stability.    Baseline 23/30; 06/11/2020: 25/30; 07/15/2020: 26/30    Time 6    Period Weeks    Status On-going                 Plan - 09/03/20 1030    Clinical Impression Statement Patient is making progress towards long term goals with improvement in FGA, however demonstrates worsening of symptoms and ambulation distance with 40minWT this may be related to a change in medication and increased symptoms during today's session. Patient continues to have increased onset of symptoms with backwards walking. Patient will benefit from further skilled therapy to return to prior level of function.    Personal Factors and Comorbidities Age;Comorbidity 3+;Time since onset of injury/illness/exacerbation;Comorbidity 2    Comorbidities MCI, HTN, CA, cataracts    Examination-Activity Limitations Bend;Sit;Squat;Stairs;Stand;Sleep    Examination-Participation Restrictions Cleaning;Community Activity    Stability/Clinical Decision Making Evolving/Moderate complexity    Rehab Potential Fair    Clinical Impairments Affecting Rehab Potential MCI    PT Frequency 2x / week    PT Duration 6 weeks    PT Treatment/Interventions ADLs/Self Care Home Management;Therapeutic exercise;Patient/family education;Neuromuscular re-education;Therapeutic activities;Functional mobility training;Balance training;Manual techniques;Gait training;Stair training;Moist Heat;Electrical Stimulation;Cryotherapy;Taping;Dry needling;Energy conservation;Passive range of motion;Joint Manipulations    PT Next Visit Plan Progress glute therex, pelvic-hip dissociation; check and progress HEP with stretches    PT Home Exercise Plan lateral step ups, step ups, hip kickers ext/abd red theraband    Consulted and Agree with Plan of Care Patient           Patient will benefit from skilled therapeutic intervention in order to improve the  following deficits and impairments:  Decreased range of motion, Postural dysfunction, Pain,  Abnormal gait, Decreased activity tolerance, Decreased coordination, Decreased endurance, Decreased balance, Decreased mobility, Difficulty walking, Hypomobility, Increased muscle spasms, Impaired perceived functional ability, Decreased strength, Impaired flexibility  Visit Diagnosis: Muscle weakness (generalized)  Difficulty in walking, not elsewhere classified     Problem List Patient Active Problem List   Diagnosis Date Noted  . Class 1 obesity with serious comorbidity and body mass index (BMI) of 32.0 to 32.9 in adult 08/29/2020  . Candidal skin infection 04/29/2020  . Parkinson disease ([place]) 02/22/2020  . 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 Avitaminosis D 08/24/2019  . Insomnia due to mental condition 05/31/2019  . Bipolar I disorder, most recent episode depressed ([place]) 05/31/2019  . Mild cognitive disorder 05/31/2019  . Alcohol use disorder, moderate, in sustained remission ([place]) 05/31/2019  . Elevated tumor markers 05/27/2019  . GAD (generalized anxiety disorder) 04/10/2019  . MDD (major depressive disorder), severe ([place]) 01/31/2019  . High serum carbohydrate antigen 19-9 (CA19-9) 12/01/2018  . Adjustment disorder with anxious mood 11/24/2018  . Essential hypertension 11/24/2018  . B12 deficiency 10/17/2018  . Incisional hernia, without obstruction or gangrene 09/12/2018  . Genetic testing 04/07/2018  . Malignant neoplasm of upper-outer quadrant of right breast in female, estrogen receptor positive ([place]) 03/17/2018  . Malignant neoplasm of upper-inner quadrant of right breast in female, estrogen receptor positive ([place]) 03/17/2018  . History of psychosis 02/07/2018  . History of insomnia 02/07/2018    Blythe Stanford 09/03/2020, 10:54 AM  [facility] [address] [place], [address] Phone: [phone]   Fax:   [phone]  Name: Kendra [name] MRN: [id number] Date of Birth: 09-21-1946

## 2020-09-04 ENCOUNTER — Ambulatory Visit: Payer: Medicare PPO

## 2020-09-04 ENCOUNTER — Other Ambulatory Visit: Payer: Self-pay

## 2020-09-04 DIAGNOSIS — M6281 Muscle weakness (generalized): Secondary | ICD-10-CM | POA: Diagnosis not present

## 2020-09-04 DIAGNOSIS — R262 Difficulty in walking, not elsewhere classified: Secondary | ICD-10-CM | POA: Diagnosis not present

## 2020-09-04 NOTE — Therapy (Signed)
[facility] [address] [place], [address] Phone: [phone]   Fax:  [phone]  Physical Therapy Treatment  Patient Details  Name: Kendra [name] MRN: [id number] Date of Birth: October 27, 1946 No data recorded  Encounter Date: 09/04/2020   PT End of Session - 09/04/20 1409    Visit Number 66    Number of Visits 70    Date for PT Re-Evaluation 08/28/20    PT Start Time 1751    PT Stop Time 1430    PT Time Calculation (min) 45 min    Activity Tolerance Patient tolerated treatment well;No increased pain    Behavior During Therapy WFL for tasks assessed/performed           Past Medical History:  Diagnosis Date  . Anxiety   . Breast cancer ([place]) 03/2018   right breast cancer at 11:00 and 1:00  . Bursitis    bilateral hips and knees  . Complication of anesthesia   . Depression   . Family history of breast cancer 03/24/2018  . Family history of lung cancer 03/24/2018  . History of alcohol dependence ([place]) 2007   no ETOH; resolved since 2007  . History of hiatal hernia   . History of psychosis 2014   due to sleep disturbance  . Hypertension   . Parkinson disease ([place])   . Personal history of radiation therapy 06/2018-07/2018   right breast ca  . PONV (postoperative nausea and vomiting)     Past Surgical History:  Procedure Laterality Date  . BREAST BIOPSY Right 03/14/2018   11:00 DCIS and invasive ductal carcinoma  . BREAST BIOPSY Right 03/14/2018   1:00 Invasive ductal carcinoma  . BREAST LUMPECTOMY Right 04/27/2018   lumpectomy of 11 and 1:00 cancers, clear margins, negative LN  . BREAST LUMPECTOMY WITH RADIOACTIVE SEED AND SENTINEL LYMPH NODE BIOPSY Right 04/27/2018   Procedure: RIGHT BREAST LUMPECTOMY WITH RADIOACTIVE SEED X 2 AND RIGHT SENTINEL LYMPH NODE BIOPSY;  Surgeon: [name], MD;  Location: [facility];  Service: General;  Laterality: Right;  . CATARACT  EXTRACTION W/PHACO Left 08/30/2019   Procedure: CATARACT EXTRACTION PHACO AND INTRAOCULAR LENS PLACEMENT ([place]) LEFT panoptix toric  01:20.5  12.7%  10.26;  Surgeon: Leandrew Koyanagi, MD;  Location: [facility];  Service: Ophthalmology;  Laterality: Left;  . CATARACT EXTRACTION W/PHACO Right 09/20/2019   Procedure: CATARACT EXTRACTION PHACO AND INTRAOCULAR LENS PLACEMENT ([facility]) RIGHT 5.71  00:36.4  15.7%;  Surgeon: Leandrew Koyanagi, MD;  Location: [facility];  Service: Ophthalmology;  Laterality: Right;  . LAPAROTOMY N/A 02/10/2018   Procedure: EXPLORATORY LAPAROTOMY;  Surgeon: Isabel Caprice, MD;  [facility];  Service: Gynecology;  Laterality: N/A;  . MASS EXCISION  01/2018   abdominal  . OVARIAN CYST REMOVAL    . SALPINGOOPHORECTOMY Bilateral 02/10/2018   Procedure: BILATERAL SALPINGO OOPHORECTOMY; PERITONEAL WASHINGS;  Surgeon: Isabel Caprice, MD;  [facility];  Service: Gynecology;  Laterality: Bilateral;  . TONSILLECTOMY    . TONSILLECTOMY AND ADENOIDECTOMY  1953    There were no vitals filed for this visit.   Subjective Assessment - 09/04/20 1402    Subjective Patient reports she has lost hearing in R ear, has an appointment to see an ENT physician on 09/11/2020. Patient states she continues to have increased difficulty with walking for prolonged periods of time.    Pertinent History Patient report unclear regarding onset. Visited MD on Sept 16 and received  injection with relief for approximately 1 month. Reports she cannot receive an additional injection pending eye surgery. Received dx from MD of hip bursitis in B hips. Reports pain with walking, stanindg, sitting 5/10 current 7/10 worst 3/10 best, with temporary relief when she is sitting "just right" and with naproxen. Agg: activity. Cormorbidities: hx of breast cancer, HTN, MCI, insomnia, cataracts    Limitations Sitting;Standing;Walking;House hold activities    How long can you sit comfortably?  Pain constant, reduced with neutral pelvic tilt    How long can you stand comfortably? Pain constant, increases with time    How long can you walk comfortably? Pain constant, increases with time    Patient Stated Goals Be able to move without hurting, walk 1 hour    Currently in Pain? No/denies    Pain Onset More than a month ago             TREATMENT Neuromuscular re-ed Seated reach out, down, forward, backward -x 15 Head turns VOR in standing left/right -- x 30sec  Rolling along wall -- x 5 each direction Cone pick ups while standing on mat -- 2 x 16 with rotational movements  Therapeutic Activities Transferred from floor to transfer x 3 attempts. 1 completed with Mod A from therapist for balance. Patient maintains strength to perform within own fruition. Performed on mat to decrease pressure onto knees.   Performed to improve balance and vestibular-based symptoms    PT Education - 09/04/20 1408    Education provided Yes    Education Details form/technique with exercise    Person(s) Educated Patient    Methods Explanation;Demonstration    Comprehension Verbalized understanding;Returned demonstration            PT Short Term Goals - 09/12/19 1419      PT SHORT TERM GOAL #1   Title Patient will demonstrate complaince with HEP to speed recovery and reduce total number of visits.    Baseline HEP given    Time 2    Period Weeks    Status New    Target Date 09/27/19             PT Long Term Goals - 09/03/20 1057      PT LONG TERM GOAL #1   Title Patient will improve hip strength to 4/5 grossly to facilitate improved mobility with ADLs.    Baseline 3, 4-; 12/22 deferred to NV; 11/09/2019: 4-/5; 12/07/2019 L hip flexor 4, abd and ext 3+; 01/29/2020: 4/5 for most hip motions, 3+/5 for abd, ext; 03/20/2020: 4/5 for hip movements    Time 8    Period Weeks    Status Achieved      PT LONG TERM GOAL #2   Title Patient will demonstrate walking tolerance increased to 30  minutes without increase in symptoms to demonstrate improved ability to perform walking program for cardiovascular health.    Baseline 10 minute tolerance (patient report); 12/22 10 minutes (per pt report); 11/09/2019: 7 min; 12/07/2019 unable to assess due to icy streets;  01/29/2020: 10 min; 03/20/2020 10 min; 05/14/2020: 10 min x 3 per day; 06/11/2020: 8min x 3 per day; 07/15/2020: 53min x 3 per day; 09/02/2020: 68min x 3 (just standing rest breaks in between)    Time 8    Period Weeks    Status On-going      PT LONG TERM GOAL #3   Title Patient will report worst pain of 3/10 during therapy to demonstrate reduced disability  Baseline Worst 7/10; 12/22 worst 9/10; 11/09/2019: 9/10; 12/07/2019 7/10; 01/29/2020: 7/10; 05/14/2020: 0/ 10    Time 8    Period Weeks    Status Achieved      PT LONG TERM GOAL #4   Title Patient will demonstrate ability to perform all bed mobility maneuvers without increase in pain for reduced disability with mobility and transfers with ADLs.    Baseline Reports pain with bed mobility supine <> sidelying L/R, sidelying to sitting EOB; 12/22 pt able to perform I without increased pain    Time 8    Period Weeks    Status Achieved      PT LONG TERM GOAL #5   Title Patient will improve 10MW speed to 1.0 m/sec to meet norms for minimal fall risk and full community ambulation.    Baseline 0.88 m/sec; 12/22 0.96 m/sec; 11/09/2019: deferred; 12/07/2019 0.90 m/sec; 01/29/2020: .9  m/sec; 03/20/2020: .29m/s; 05/14/2020: . 9 m/s; 06/11/2020: .84m/s; 07/15/2020: 1 m/s    Time 8    Period Weeks    Status Achieved      PT LONG TERM GOAL #6   Title Patient will increase 6MWT by 200 ft to demonstrate improvement in functional capacity for transition to cardiac walking program    Baseline 6MWT = 1195; 1/7/20201: deferred; 12/07/2019 1095; 01/29/2020: deferred secondary to fatigue; 03/20/2020: 1060ft; 07/15/2020: 1131ft; 09/03/2020: 824ft    Time 8    Period Weeks    Status On-going      PT LONG TERM  GOAL #7   Title Patient will improve her 5xsts to under 15 sec to indicate singificnat improvement in LE functional strength and decrease in fall risk.    Baseline 24.5 sec; 03/20/2020: 19.19 sec; 06/13/2020: 14. 92    Time 6    Period Weeks    Status Achieved      PT LONG TERM GOAL #8   Title Patient will improve FGA to 28/30 to indicate improvement in dynamic balance and improvement in stability.    Baseline 23/30; 06/11/2020: 25/30; 07/15/2020: 26/30; 09/02/2020: 27/30    Time 6    Period Weeks    Status On-going                 Plan - 09/04/20 1420    Clinical Impression Statement Addressed transferring from floor to standing today patient requires 3 attempts to perform able to perform on the third attempt.  Patient requires moderate assistance for balance and maintaining stability during transfer.  Patient performs transfer from floor to ground better with use of Gowers sign versus deep lunge position.  We will continue to address vestibular limitations and functional strengthening throughout therapy.  Patient will benefit from further skilled therapy to return to prior level of function.    Personal Factors and Comorbidities Age;Comorbidity 3+;Time since onset of injury/illness/exacerbation;Comorbidity 2    Comorbidities MCI, HTN, CA, cataracts    Examination-Activity Limitations Bend;Sit;Squat;Stairs;Stand;Sleep    Examination-Participation Restrictions Cleaning;Community Activity    Stability/Clinical Decision Making Evolving/Moderate complexity    Rehab Potential Fair    Clinical Impairments Affecting Rehab Potential MCI    PT Frequency 2x / week    PT Duration 6 weeks    PT Treatment/Interventions ADLs/Self Care Home Management;Therapeutic exercise;Patient/family education;Neuromuscular re-education;Therapeutic activities;Functional mobility training;Balance training;Manual techniques;Gait training;Stair training;Moist Heat;Electrical Stimulation;Cryotherapy;Taping;Dry  needling;Energy conservation;Passive range of motion;Joint Manipulations    PT Next Visit Plan Progress glute therex, pelvic-hip dissociation; check and progress HEP with stretches    PT Home Exercise Plan  lateral step ups, step ups, hip kickers ext/abd red theraband    Consulted and Agree with Plan of Care Patient           Patient will benefit from skilled therapeutic intervention in order to improve the following deficits and impairments:  Decreased range of motion, Postural dysfunction, Pain, Abnormal gait, Decreased activity tolerance, Decreased coordination, Decreased endurance, Decreased balance, Decreased mobility, Difficulty walking, Hypomobility, Increased muscle spasms, Impaired perceived functional ability, Decreased strength, Impaired flexibility  Visit Diagnosis: Muscle weakness (generalized)  Difficulty in walking, not elsewhere classified     Problem List Patient Active Problem List   Diagnosis Date Noted  . Class 1 obesity with serious comorbidity and body mass index (BMI) of 32.0 to 32.9 in adult 08/29/2020  . Candidal skin infection 04/29/2020  . Parkinson disease ([place]) 02/22/2020  . 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 Avitaminosis D 08/24/2019  . Insomnia due to mental condition 05/31/2019  . Bipolar I disorder, most recent episode depressed ([place]) 05/31/2019  . Mild cognitive disorder 05/31/2019  . Alcohol use disorder, moderate, in sustained remission ([place]) 05/31/2019  . Elevated tumor markers 05/27/2019  . GAD (generalized anxiety disorder) 04/10/2019  . MDD (major depressive disorder), severe ([place]) 01/31/2019  . High serum carbohydrate antigen 19-9 (CA19-9) 12/01/2018  . Adjustment disorder with anxious mood 11/24/2018  . Essential hypertension 11/24/2018  . B12 deficiency 10/17/2018  . Incisional hernia, without obstruction or gangrene 09/12/2018  . Genetic testing 04/07/2018  . Malignant neoplasm of upper-outer quadrant of right breast in female,  estrogen receptor positive ([place]) 03/17/2018  . Malignant neoplasm of upper-inner quadrant of right breast in female, estrogen receptor positive ([place]) 03/17/2018  . History of psychosis 02/07/2018  . History of insomnia 02/07/2018    Blythe Stanford, PT DPT  09/04/2020, 2:29 PM  [facility] [address], [place], [address] Phone: [phone]   Fax:  [phone]  Name: Belva Koziel MRN: [id number] Date of Birth: [birth year]

## 2020-09-06 DIAGNOSIS — M85851 Other specified disorders of bone density and structure, right thigh: Secondary | ICD-10-CM | POA: Insufficient documentation

## 2020-09-09 ENCOUNTER — Other Ambulatory Visit: Payer: Self-pay

## 2020-09-09 ENCOUNTER — Ambulatory Visit: Payer: Medicare PPO

## 2020-09-09 ENCOUNTER — Telehealth: Payer: Self-pay

## 2020-09-09 DIAGNOSIS — M6281 Muscle weakness (generalized): Secondary | ICD-10-CM

## 2020-09-09 DIAGNOSIS — R262 Difficulty in walking, not elsewhere classified: Secondary | ICD-10-CM

## 2020-09-09 NOTE — Telephone Encounter (Signed)
Patient was called today, in regard to provider wanting to know if patient was still interested in Prolia treatment and dental clearance. Patient reports that she has not went to her dentist yet and was not sure at this time. She states she will reach out to dentist for clearance. She requests if she has not called back within a week to please contact her again regarding this matter.

## 2020-09-09 NOTE — Telephone Encounter (Signed)
  Please put on your schedule to call in 1 week.  M

## 2020-09-09 NOTE — Therapy (Signed)
[facility] [address] [place], [address] Phone: [phone]   Fax:  [phone]  Physical Therapy Treatment  Patient Details  Name: Kendra [name] MRN: [id number] Date of Birth: 03/02/1946 No data recorded  Encounter Date: 09/09/2020   PT End of Session - 09/09/20 1440    Visit Number 72    Number of Visits 70    Date for PT Re-Evaluation 10/28/20    PT Start Time 3151    PT Stop Time 1430    PT Time Calculation (min) 45 min    Activity Tolerance Patient tolerated treatment well;No increased pain    Behavior During Therapy WFL for tasks assessed/performed           Past Medical History:  Diagnosis Date  . Anxiety   . Breast cancer ([place]) 03/2018   right breast cancer at 11:00 and 1:00  . Bursitis    bilateral hips and knees  . Complication of anesthesia   . Depression   . Family history of breast cancer 03/24/2018  . Family history of lung cancer 03/24/2018  . History of alcohol dependence ([place]) 2007   no ETOH; resolved since 2007  . History of hiatal hernia   . History of psychosis 2014   due to sleep disturbance  . Hypertension   . Parkinson disease ([place])   . Personal history of radiation therapy 06/2018-07/2018   right breast ca  . PONV (postoperative nausea and vomiting)     Past Surgical History:  Procedure Laterality Date  . BREAST BIOPSY Right 03/14/2018   11:00 DCIS and invasive ductal carcinoma  . BREAST BIOPSY Right 03/14/2018   1:00 Invasive ductal carcinoma  . BREAST LUMPECTOMY Right 04/27/2018   lumpectomy of 11 and 1:00 cancers, clear margins, negative LN  . BREAST LUMPECTOMY WITH RADIOACTIVE SEED AND SENTINEL LYMPH NODE BIOPSY Right 04/27/2018   Procedure: RIGHT BREAST LUMPECTOMY WITH RADIOACTIVE SEED X 2 AND RIGHT SENTINEL LYMPH NODE BIOPSY;  Surgeon: [name], MD;  Location: [facility];  Service: General;  Laterality: Right;  . CATARACT  EXTRACTION W/PHACO Left 08/30/2019   Procedure: CATARACT EXTRACTION PHACO AND INTRAOCULAR LENS PLACEMENT ([place]) LEFT panoptix toric  01:20.5  12.7%  10.26;  Surgeon: Leandrew Koyanagi, MD;  Location: [facility];  Service: Ophthalmology;  Laterality: Left;  . CATARACT EXTRACTION W/PHACO Right 09/20/2019   Procedure: CATARACT EXTRACTION PHACO AND INTRAOCULAR LENS PLACEMENT ([facility]) RIGHT 5.71  00:36.4  15.7%;  Surgeon: Leandrew Koyanagi, MD;  Location: [facility];  Service: Ophthalmology;  Laterality: Right;  . LAPAROTOMY N/A 02/10/2018   Procedure: EXPLORATORY LAPAROTOMY;  Surgeon: Isabel Caprice, MD;  Location: [facility];  Service: Gynecology;  Laterality: N/A;  . MASS EXCISION  01/2018   abdominal  . OVARIAN CYST REMOVAL    . SALPINGOOPHORECTOMY Bilateral 02/10/2018   Procedure: BILATERAL SALPINGO OOPHORECTOMY; PERITONEAL WASHINGS;  Surgeon: Isabel Caprice, MD;  Location: [facility];  Service: Gynecology;  Laterality: Bilateral;  . TONSILLECTOMY    . TONSILLECTOMY AND ADENOIDECTOMY  1953    There were no vitals filed for this visit.   Subjective Assessment - 09/09/20 1351    Subjective Patient reports increased "wobbly-ness" from walking. States she does not know where hher feet are in space.    Pertinent History Patient report unclear regarding onset. Visited MD on Sept 16 and received injection with relief for approximately 1 month. Reports she cannot receive an additional injection pending eye surgery.  Received dx from MD of hip bursitis in B hips. Reports pain with walking, stanindg, sitting 5/10 current 7/10 worst 3/10 best, with temporary relief when she is sitting "just right" and with naproxen. Agg: activity. Cormorbidities: hx of breast cancer, HTN, MCI, insomnia, cataracts    Limitations Sitting;Standing;Walking;House hold activities    How long can you sit comfortably? Pain constant, reduced with neutral pelvic tilt    How long can you stand comfortably? Pain  constant, increases with time    How long can you walk comfortably? Pain constant, increases with time    Patient Stated Goals Be able to move without hurting, walk 1 hour    Currently in Pain? No/denies    Pain Onset More than a month ago                TREATMENT Neuromuscular re-ed Backwards walking with ball toss in all directions - 4 x 52ft Rolling along wall -- x 5 each direction Cone pick ups while standing on mat -- 2 x 10 with rotational movements Sitting reaching up, out, down and back - x 15 Soccer kicks in standing - x 10 performed B to improve    Performed to improve balance and vestibular-based symptoms        PT Education - 09/09/20 1352    Education provided Yes    Education Details form/technique with exercise    Person(s) Educated Patient    Methods Explanation;Demonstration    Comprehension Verbalized understanding;Returned demonstration            PT Short Term Goals - 09/12/19 1419      PT SHORT TERM GOAL #1   Title Patient will demonstrate complaince with HEP to speed recovery and reduce total number of visits.    Baseline HEP given    Time 2    Period Weeks    Status New    Target Date 09/27/19             PT Long Term Goals - 09/03/20 1057      PT LONG TERM GOAL #1   Title Patient will improve hip strength to 4/5 grossly to facilitate improved mobility with ADLs.    Baseline 3, 4-; 12/22 deferred to NV; 11/09/2019: 4-/5; 12/07/2019 L hip flexor 4, abd and ext 3+; 01/29/2020: 4/5 for most hip motions, 3+/5 for abd, ext; 03/20/2020: 4/5 for hip movements    Time 8    Period Weeks    Status Achieved      PT LONG TERM GOAL #2   Title Patient will demonstrate walking tolerance increased to 30 minutes without increase in symptoms to demonstrate improved ability to perform walking program for cardiovascular health.    Baseline 10 minute tolerance (patient report); 12/22 10 minutes (per pt report); 11/09/2019: 7 min; 12/07/2019 unable to assess  due to icy streets;  01/29/2020: 10 min; 03/20/2020 10 min; 05/14/2020: 10 min x 3 per day; 06/11/2020: 74min x 3 per day; 07/15/2020: 24min x 3 per day; 09/02/2020: 57min x 3 (just standing rest breaks in between)    Time 8    Period Weeks    Status On-going      PT LONG TERM GOAL #3   Title Patient will report worst pain of 3/10 during therapy to demonstrate reduced disability    Baseline Worst 7/10; 12/22 worst 9/10; 11/09/2019: 9/10; 12/07/2019 7/10; 01/29/2020: 7/10; 05/14/2020: 0/ 10    Time 8    Period Weeks    Status Achieved  PT LONG TERM GOAL #4   Title Patient will demonstrate ability to perform all bed mobility maneuvers without increase in pain for reduced disability with mobility and transfers with ADLs.    Baseline Reports pain with bed mobility supine <> sidelying L/R, sidelying to sitting EOB; 12/22 pt able to perform I without increased pain    Time 8    Period Weeks    Status Achieved      PT LONG TERM GOAL #5   Title Patient will improve 10MW speed to 1.0 m/sec to meet norms for minimal fall risk and full community ambulation.    Baseline 0.88 m/sec; 12/22 0.96 m/sec; 11/09/2019: deferred; 12/07/2019 0.90 m/sec; 01/29/2020: .9  m/sec; 03/20/2020: .7m/s; 05/14/2020: . 9 m/s; 06/11/2020: .63m/s; 07/15/2020: 1 m/s    Time 8    Period Weeks    Status Achieved      PT LONG TERM GOAL #6   Title Patient will increase 6MWT by 200 ft to demonstrate improvement in functional capacity for transition to cardiac walking program    Baseline 6MWT = 1195; 1/7/20201: deferred; 12/07/2019 1095; 01/29/2020: deferred secondary to fatigue; 03/20/2020: 1043ft; 07/15/2020: 1125ft; 09/03/2020: 824ft    Time 8    Period Weeks    Status On-going      PT LONG TERM GOAL #7   Title Patient will improve her 5xsts to under 15 sec to indicate singificnat improvement in LE functional strength and decrease in fall risk.    Baseline 24.5 sec; 03/20/2020: 19.19 sec; 06/13/2020: 14. 92    Time 6    Period Weeks    Status  Achieved      PT LONG TERM GOAL #8   Title Patient will improve FGA to 28/30 to indicate improvement in dynamic balance and improvement in stability.    Baseline 23/30; 06/11/2020: 25/30; 07/15/2020: 26/30; 09/02/2020: 27/30    Time 6    Period Weeks    Status On-going                 Plan - 09/09/20 1451    Clinical Impression Statement Performed a greater amount exercises requiring head movement in all all directions including diagonal motions, up/down, laterally, and circular motions.  Patient with increased symptoms during today's visit and requires sitting rest breaks to resolve.  Although patient is having increased difficulty she requires less therapist assistance to maintain balance indicating functional carryover between sessions.  We will continue to address these limitations throughout further therapy sessions.  Patient will benefit from further skilled therapy to return to prior level of function.    Personal Factors and Comorbidities Age;Comorbidity 3+;Time since onset of injury/illness/exacerbation;Comorbidity 2    Comorbidities MCI, HTN, CA, cataracts    Examination-Activity Limitations Bend;Sit;Squat;Stairs;Stand;Sleep    Examination-Participation Restrictions Cleaning;Community Activity    Stability/Clinical Decision Making Evolving/Moderate complexity    Rehab Potential Fair    Clinical Impairments Affecting Rehab Potential MCI    PT Frequency 2x / week    PT Duration 6 weeks    PT Treatment/Interventions ADLs/Self Care Home Management;Therapeutic exercise;Patient/family education;Neuromuscular re-education;Therapeutic activities;Functional mobility training;Balance training;Manual techniques;Gait training;Stair training;Moist Heat;Electrical Stimulation;Cryotherapy;Taping;Dry needling;Energy conservation;Passive range of motion;Joint Manipulations    PT Next Visit Plan Progress glute therex, pelvic-hip dissociation; check and progress HEP with stretches    PT Home  Exercise Plan lateral step ups, step ups, hip kickers ext/abd red theraband    Consulted and Agree with Plan of Care Patient  Patient will benefit from skilled therapeutic intervention in order to improve the following deficits and impairments:  Decreased range of motion, Postural dysfunction, Pain, Abnormal gait, Decreased activity tolerance, Decreased coordination, Decreased endurance, Decreased balance, Decreased mobility, Difficulty walking, Hypomobility, Increased muscle spasms, Impaired perceived functional ability, Decreased strength, Impaired flexibility  Visit Diagnosis: Muscle weakness (generalized)  Difficulty in walking, not elsewhere classified     Problem List Patient Active Problem List   Diagnosis Date Noted  . Osteopenia of neck of right femur 09/06/2020  . Class 1 obesity with serious comorbidity and body mass index (BMI) of 32.0 to 32.9 in adult 08/29/2020  . Candidal skin infection 04/29/2020  . Parkinson disease ([place]) 02/22/2020  . 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 Avitaminosis D 08/24/2019  . Insomnia due to mental condition 05/31/2019  . Bipolar I disorder, most recent episode depressed ([place]) 05/31/2019  . Mild cognitive disorder 05/31/2019  . Alcohol use disorder, moderate, in sustained remission ([place]) 05/31/2019  . Elevated tumor markers 05/27/2019  . GAD (generalized anxiety disorder) 04/10/2019  . MDD (major depressive disorder), severe ([place]) 01/31/2019  . High serum carbohydrate antigen 19-9 (CA19-9) 12/01/2018  . Adjustment disorder with anxious mood 11/24/2018  . Essential hypertension 11/24/2018  . B12 deficiency 10/17/2018  . Incisional hernia, without obstruction or gangrene 09/12/2018  . Genetic testing 04/07/2018  . Malignant neoplasm of upper-outer quadrant of right breast in female, estrogen receptor positive ([place]) 03/17/2018  . Malignant neoplasm of upper-inner quadrant of right breast in female, estrogen receptor positive ([place])  03/17/2018  . History of psychosis 02/07/2018  . History of insomnia 02/07/2018    Blythe Stanford, PT DPT 09/09/2020, 3:37 PM  [facility] [address] [place], [address] Phone: [phone]   Fax:  [phone]  Name: Kendra [name] MRN: [id number] Date of Birth: 1946-04-25

## 2020-09-12 ENCOUNTER — Encounter: Payer: Self-pay | Admitting: Family Medicine

## 2020-09-12 ENCOUNTER — Ambulatory Visit (INDEPENDENT_AMBULATORY_CARE_PROVIDER_SITE_OTHER): Payer: Medicare PPO | Admitting: General Surgery

## 2020-09-12 ENCOUNTER — Encounter: Payer: Self-pay | Admitting: General Surgery

## 2020-09-12 ENCOUNTER — Other Ambulatory Visit: Payer: Self-pay

## 2020-09-12 VITALS — BP 144/92 | HR 102 | Temp 98.6°F | Ht 66.0 in | Wt 202.0 lb

## 2020-09-12 DIAGNOSIS — K432 Incisional hernia without obstruction or gangrene: Secondary | ICD-10-CM | POA: Diagnosis not present

## 2020-09-12 NOTE — Patient Instructions (Addendum)
Please call our office if you have any questions or concerns. You may consider purchasing an abdominal binder. This can be purchased at [facility] or any drug store. Please call our office if you decide to move forward with having surgery.   Ventral Hernia  A ventral hernia is a bulge of tissue from inside the abdomen that pushes through a weak area of the muscles that form the front wall of the abdomen. The tissues inside the abdomen are inside a sac (peritoneum). These tissues include the small intestine, large intestine, and the fatty tissue that covers the intestines (omentum). Sometimes, the bulge that forms a hernia contains intestines. Other hernias contain only fat. Ventral hernias do not go away without surgical treatment. There are several types of ventral hernias. You may have:  A hernia at an incision site from previous abdominal surgery (incisional hernia).  A hernia just above the belly button (epigastric hernia), or at the belly button (umbilical hernia). These types of hernias can develop from heavy lifting or straining.  A hernia that comes and goes (reducible hernia). It may be visible only when you lift or strain. This type of hernia can be pushed back into the abdomen (reduced).  A hernia that traps abdominal tissue inside the hernia (incarcerated hernia). This type of hernia does not reduce.  A hernia that cuts off blood flow to the tissues inside the hernia (strangulated hernia). The tissues can start to die if this happens. This is a very painful bulge that cannot be reduced. A strangulated hernia is a medical emergency. What are the causes? This condition is caused by abdominal tissue putting pressure on an area of weakness in the abdominal muscles. What increases the risk? The following factors may make you more likely to develop this condition:  Being female.  Being 73 or older.  Being overweight or obese.  Having had previous abdominal surgery, especially if there  was an infection after surgery.  Having had an injury to the abdominal wall.  Having had several pregnancies.  Having a buildup of fluid inside the abdomen (ascites). What are the signs or symptoms? The only symptom of a ventral hernia may be a painless bulge in the abdomen. A reducible hernia may be visible only when you strain, cough, or lift. Other symptoms may include:  Dull pain.  A feeling of pressure. Signs and symptoms of a strangulated hernia may include:  Increasing pain.  Nausea and vomiting.  Pain when pressing on the hernia.  The skin over the hernia turning red or purple.  Constipation.  Blood in the stool (feces). How is this diagnosed? This condition may be diagnosed based on:  Your symptoms.  Your medical history.  A physical exam. You may be asked to cough or strain while standing. These actions increase the pressure inside your abdomen and force the hernia through the opening in your muscles. Your health care provider may try to reduce the hernia by pressing on it.  Imaging studies, such as an ultrasound or CT scan. How is this treated? This condition is treated with surgery. If you have a strangulated hernia, surgery is done as soon as possible. If your hernia is small and not incarcerated, you may be asked to lose some weight before surgery. Follow these instructions at home:  Follow instructions from your health care provider about eating or drinking restrictions.  If you are overweight, your health care provider may recommend that you increase your activity level and eat a healthier diet.  Do not lift anything that is heavier than 10 lb (4.5 kg).  Return to your normal activities as told by your health care provider. Ask your health care provider what activities are safe for you. You may need to avoid activities that increase pressure on your hernia.  Take over-the-counter and prescription medicines only as told by your health care  provider.  Keep all follow-up visits as told by your health care provider. This is important. Contact a health care provider if:  Your hernia gets larger.  Your hernia becomes painful. Get help right away if:  Your hernia becomes increasingly painful.  You have pain along with any of the following: ? Changes in skin color in the area of the hernia. ? Nausea. ? Vomiting. ? Fever. Summary  A ventral hernia is a bulge of tissue from inside the abdomen that pushes through a weak area of the muscles that form the front wall of the abdomen.  This condition is treated with surgery, which may be urgent depending on your hernia.  Do not lift anything that is heavier than 10 lb (4.5 kg), and follow activity instructions from your health care provider. This information is not intended to replace advice given to you by your health care provider. Make sure you discuss any questions you have with your health care provider. Document Revised: 12/01/2017 Document Reviewed: 05/10/2017 Elsevier Patient Education  [place].

## 2020-09-12 NOTE — Progress Notes (Signed)
Patient ID: Kendra [name], female   DOB: May 16, 1946, 74 y.o.   MRN: [id number]  Chief Complaint  Patient presents with  . Follow-up    incisional hernia    HPI Kendra [name] is a 74 y.o. female.   I first saw her in November 2019 when she was referred for a "bump" on her abdomen adjacent to a prior surgical incision.  A CT scan of the abdomen and pelvis was subsequently performed that demonstrated 3 fascial defects along a prior midline laparotomy incision.  She did not desire surgical intervention at that time.  I saw her again, roughly 6 months later.  Once again, she declined surgical intervention, as the hernias are not particularly symptomatic.  Since our visit in May 2020, she has been diagnosed with Parkinson's disease and is currently on appropriate medical management for this.  She has some questions regarding the implications of her disease on the hernias and is accompanied by her husband today to discuss this.  She continues to deny any nausea or vomiting.  Her constipation is significantly better with daily use of MiraLAX.  She denies any symptoms suggestive of obstruction.  She has no pain associated with the hernias and she states that she is always able to push the bulge back in, if she tries.  Her chief complaint is that she has to shop at multiple stores in order to find pants that fit over the bulge.  She states that her activity level has been decreased secondary to Parkinsonism; it has not needed to be adjusted to accommodate her hernia defects.   Past Medical History:  Diagnosis Date  . Anxiety   . Breast cancer ([place]) 03/2018   right breast cancer at 11:00 and 1:00  . Bursitis    bilateral hips and knees  . Complication of anesthesia   . Depression   . Family history of breast cancer 03/24/2018  . Family history of lung cancer 03/24/2018  . History of alcohol dependence ([place]) 2007   no ETOH; resolved since 2007  . History of hiatal hernia   . History of psychosis 2014     due to sleep disturbance  . Hypertension   . Parkinson disease ([place])   . Personal history of radiation therapy 06/2018-07/2018   right breast ca  . PONV (postoperative nausea and vomiting)     Past Surgical History:  Procedure Laterality Date  . BREAST BIOPSY Right 03/14/2018   11:00 DCIS and invasive ductal carcinoma  . BREAST BIOPSY Right 03/14/2018   1:00 Invasive ductal carcinoma  . BREAST LUMPECTOMY Right 04/27/2018   lumpectomy of 11 and 1:00 cancers, clear margins, negative LN  . BREAST LUMPECTOMY WITH RADIOACTIVE SEED AND SENTINEL LYMPH NODE BIOPSY Right 04/27/2018   Procedure: RIGHT BREAST LUMPECTOMY WITH RADIOACTIVE SEED X 2 AND RIGHT SENTINEL LYMPH NODE BIOPSY;  Surgeon: [name], MD;  Location: [facility];  Service: General;  Laterality: Right;  . CATARACT EXTRACTION W/PHACO Left 08/30/2019   Procedure: CATARACT EXTRACTION PHACO AND INTRAOCULAR LENS PLACEMENT ([place]) LEFT panoptix toric  01:20.5  12.7%  10.26;  Surgeon: Leandrew Koyanagi, MD;  Location: [facility];  Service: Ophthalmology;  Laterality: Left;  . CATARACT EXTRACTION W/PHACO Right 09/20/2019   Procedure: CATARACT EXTRACTION PHACO AND INTRAOCULAR LENS PLACEMENT ([facility]) RIGHT 5.71  00:36.4  15.7%;  Surgeon: Leandrew Koyanagi, MD;  Location: [facility];  Service: Ophthalmology;  Laterality: Right;  . LAPAROTOMY N/A 02/10/2018   Procedure: EXPLORATORY LAPAROTOMY;  Surgeon: Precious Haws  B, MD;  [facility];  Service: Gynecology;  Laterality: N/A;  . MASS EXCISION  01/2018   abdominal  . OVARIAN CYST REMOVAL    . SALPINGOOPHORECTOMY Bilateral 02/10/2018   Procedure: BILATERAL SALPINGO OOPHORECTOMY; PERITONEAL WASHINGS;  Surgeon: Isabel Caprice, MD;  [facility];  Service: Gynecology;  Laterality: Bilateral;  . TONSILLECTOMY    . TONSILLECTOMY AND ADENOIDECTOMY  1953    Family History  Problem Relation Age of Onset  . Diabetes Mother   . Breast cancer  Mother 66  . Hypertension Mother   . Lung cancer Father        asbestos exposure  . Heart disease Brother 26  . Breast cancer Cousin 39       paternal cousin  . Heart disease Paternal Grandmother 83  . Heart disease Paternal Grandfather     Social History Social History   Tobacco Use  . Smoking status: Never Smoker  . Smokeless tobacco: Never Used  Vaping Use  . Vaping Use: Never used  Substance Use Topics  . Alcohol use: Not Currently    Comment: alcohol dependence prior to 2007  . Drug use: Never    Allergies  Allergen Reactions  . Hydroxyzine Anaphylaxis    Tongue swollen  . Other Other (See Comments)    Food poisoning   . Shrimp [Shellfish Allergy] Other (See Comments)    Food poisoning     Current Outpatient Medications  Medication Sig Dispense Refill  . Acetaminophen 500 MG capsule Take 1,000 mg by mouth every 6 (six) hours as needed.     Marland Kitchen buPROPion (WELLBUTRIN XL) 150 MG 24 hr tablet Take 150 mg by mouth daily. Unsure dose    . buPROPion (WELLBUTRIN XL) 300 MG 24 hr tablet Take 300 mg by mouth daily.    . carbidopa-levodopa (SINEMET IR) 25-100 MG tablet Take 1 tablet by mouth at bedtime.     . cyanocobalamin (,VITAMIN B-12,) 1000 MCG/ML injection Inject 1 mL (1,000 mcg total) into the muscle every 30 (thirty) days. 3 mL 3  . FLUoxetine HCl 60 MG TABS Take 60 mg by mouth daily. 90 tablet 1  . letrozole (FEMARA) 2.5 MG tablet Take 1 tablet (2.5 mg total) by mouth daily. 90 tablet 3  . lisinopril-hydrochlorothiazide (ZESTORETIC) 20-12.5 MG tablet Take 1 tablet by mouth daily. 90 tablet 3  . naproxen sodium (ALEVE) 220 MG tablet Take 220 mg by mouth 2 (two) times daily as needed.     . nystatin (MYCOSTATIN/NYSTOP) powder Apply 1 application topically 3 (three) times daily. 15 g 1  . polyethylene glycol (MIRALAX / GLYCOLAX) 17 g packet Take 17 g by mouth daily.    . risperiDONE (RISPERDAL) 2 MG tablet Take 0.5 tablets (1 mg total) by mouth 2 (two) times daily. 90  tablet 3  . traZODone (DESYREL) 50 MG tablet Take 1.5-2 tablets (75-100 mg total) by mouth at bedtime as needed for sleep. 180 tablet 1  . carbidopa-levodopa (SINEMET IR) 25-100 MG tablet Take 2 tablets by mouth 4 (four) times daily.      Current Facility-Administered Medications  Medication Dose Route Frequency Provider Last Rate Last Admin  . lidocaine (PF) (XYLOCAINE) 1 % injection 4 mL  4 mL Intradermal Once Virginia Crews, MD      . lidocaine (PF) (XYLOCAINE) 1 % injection 4 mL  4 mL Intradermal Once Virginia Crews, MD        Review of Systems Review of Systems  All  other systems reviewed and are negative. Or as discussed in the history of present illness.  Blood pressure (!) 144/92, pulse (!) 102, temperature 98.6 F (37 C), temperature source Oral, height 5\' 6"  (1.676 m), weight 202 lb (91.6 kg), SpO2 96 %. Body mass index is 32.6 kg/m.  Physical Exam Physical Exam Constitutional:      General: She is not in acute distress.    Appearance: She is obese.  HENT:     Head: Normocephalic and atraumatic.     Ears:     Comments: She is quite hard of hearing.    Nose:     Comments: Covered with a mask    Mouth/Throat:     Comments: Covered with a mask Eyes:     General: No scleral icterus.       Right eye: No discharge.        Left eye: No discharge.  Neck:     Comments: No palpable cervical or supraclavicular lymphadenopathy.  The trachea is midline.  No dominant thyroid masses or thyromegaly appreciated. Cardiovascular:     Rate and Rhythm: Normal rate and regular rhythm.     Comments: Although nursing documentation indicates tachycardia, at the time of my exam, this had resolved. Pulmonary:     Effort: Pulmonary effort is normal.     Breath sounds: Normal breath sounds.  Abdominal:     Palpations: Abdomen is soft.     Hernia: A hernia is present. Hernia is present in the ventral area.       Comments: Visible hernia adjacent to the upper portion of her  midline incision.  It is soft and easily reducible.  There is no pain associated with the defect.  Musculoskeletal:     Comments: Trace pretibial edema.  Skin:    General: Skin is warm and dry.     Comments: Innumerable seborrheic keratoses and skin tags.  Neurological:     General: No focal deficit present.     Mental Status: She is alert and oriented to person, place, and time.     Comments: Faint pill-rolling tremor in the bilateral upper extremities.  Psychiatric:        Mood and Affect: Mood normal.        Behavior: Behavior normal.     Data Reviewed I reviewed the CT scan performed in December 2019.  I concur with the radiologist's interpretation which is copied here:  CLINICAL DATA:  Elevated liver function tests. Surgery for benign cystic ovarian neoplasm and right breast carcinoma approximately 6 months ago. Ventral hernia.  EXAM: CT ABDOMEN AND PELVIS WITH CONTRAST  TECHNIQUE: Multidetector CT imaging of the abdomen and pelvis was performed using the standard protocol following bolus administration of intravenous contrast.  CONTRAST:  154mL ISOVUE-300 IOPAMIDOL (ISOVUE-300) INJECTION 61%  COMPARISON:  02/03/2018  FINDINGS: Lower Chest: No acute findings.  Hepatobiliary: No hepatic masses identified. Relative enlargement of the left hepatic lobe compared to the right, raising suspicion for hepatic cirrhosis. Gallbladder is unremarkable.  Pancreas:  No mass or inflammatory changes.  Spleen: Within normal limits in size and appearance.  Adrenals/Urinary Tract: No masses identified. No evidence of hydronephrosis.  Stomach/Bowel: No evidence of obstruction, inflammatory process or abnormal fluid collections. Normal appendix visualized. Large colonic stool burden noted. A small midline epigastric ventral hernia is seen containing only fat. A moderate paraumbilical hernia is seen containing transverse colon. A small suprapubic ventral hernia is seen  containing a loop of small bowel.  Vascular/Lymphatic: No  pathologically enlarged lymph nodes. No abdominal aortic aneurysm. Aortic atherosclerosis.  Reproductive: Patient has undergone surgical resection of large complex cystic pelvic mass since previous study. Normal appearance of uterus. Adnexal regions are unremarkable. No evidence of peritoneal soft tissue density or ascites.  Other:  None.  Musculoskeletal: No suspicious bone lesions identified. Lumbar spine facet DJD noted with grade 1 anterolisthesis at L4-5.  IMPRESSION: No acute findings.  Interval surgical resection of large cystic pelvic mass. No evidence of residual or metastatic neoplasm.  Suspect hepatic cirrhosis.  Three midline ventral abdominal wall hernias, as described above.  Large stool burden noted; recommend clinical correlation for possible constipation.   Epigastric hernia   Periumbilical hernia   Suprapubic defect   Assessment This is a 74 year old woman who had a midline laparotomy for removal of what proved to be a benign cystic neoplasm of the ovary.  She has 3 defects related to this incision.  She is essentially asymptomatic from her hernias, but had concerns regarding the effect of her Parkinson's on the hernias and vice versa.  Plan I reassured her that her Parkinson's medications would not affect her hernia and conversely, her hernia is unlikely to have any significant effect on her Parkinson's disease.  She is still undecided as to whether or not she would like to pursue surgical repair.  I did discuss the process of robot-assisted ventral hernia repair with the patient and her husband.  I discussed the use of mesh, which would be required.  I also discussed the risks of bleeding, infection, mesh migration or infection, damage to bowel or other surrounding structures, as well as the risks of general anesthesia.  At this time, they are still not certain as to whether or not they  would like to pursue operative intervention.  They would like to discuss it further and will contact her office with her decision.    Fredirick Maudlin 09/12/2020, 11:25 AM

## 2020-09-16 ENCOUNTER — Other Ambulatory Visit: Payer: Self-pay

## 2020-09-16 ENCOUNTER — Ambulatory Visit: Payer: Medicare PPO

## 2020-09-16 DIAGNOSIS — R262 Difficulty in walking, not elsewhere classified: Secondary | ICD-10-CM | POA: Diagnosis not present

## 2020-09-16 DIAGNOSIS — M6281 Muscle weakness (generalized): Secondary | ICD-10-CM | POA: Diagnosis not present

## 2020-09-16 NOTE — Therapy (Signed)
[facility] [address] [place], [address] Phone: [phone]   Fax:  [phone]  Physical Therapy Treatment  Patient Details  Name: Kendra [name] MRN: [id number] Date of Birth: 12/07/1945 No data recorded  Encounter Date: 09/16/2020   PT End of Session - 09/16/20 1352    Visit Number 68    Number of Visits 70    Date for PT Re-Evaluation 10/28/20    PT Start Time 0086    PT Stop Time 1430    PT Time Calculation (min) 45 min    Activity Tolerance Patient tolerated treatment well;No increased pain    Behavior During Therapy WFL for tasks assessed/performed           Past Medical History:  Diagnosis Date  . Anxiety   . Breast cancer ([place]) 03/2018   right breast cancer at 11:00 and 1:00  . Bursitis    bilateral hips and knees  . Complication of anesthesia   . Depression   . Family history of breast cancer 03/24/2018  . Family history of lung cancer 03/24/2018  . History of alcohol dependence ([place]) 2007   no ETOH; resolved since 2007  . History of hiatal hernia   . History of psychosis 2014   due to sleep disturbance  . Hypertension   . Parkinson disease ([place])   . Personal history of radiation therapy 06/2018-07/2018   right breast ca  . PONV (postoperative nausea and vomiting)     Past Surgical History:  Procedure Laterality Date  . BREAST BIOPSY Right 03/14/2018   11:00 DCIS and invasive ductal carcinoma  . BREAST BIOPSY Right 03/14/2018   1:00 Invasive ductal carcinoma  . BREAST LUMPECTOMY Right 04/27/2018   lumpectomy of 11 and 1:00 cancers, clear margins, negative LN  . BREAST LUMPECTOMY WITH RADIOACTIVE SEED AND SENTINEL LYMPH NODE BIOPSY Right 04/27/2018   Procedure: RIGHT BREAST LUMPECTOMY WITH RADIOACTIVE SEED X 2 AND RIGHT SENTINEL LYMPH NODE BIOPSY;  Surgeon: [name], MD;  Location: [facility];  Service: General;  Laterality: Right;  . CATARACT  EXTRACTION W/PHACO Left 08/30/2019   Procedure: CATARACT EXTRACTION PHACO AND INTRAOCULAR LENS PLACEMENT ([place] [place]) LEFT panoptix toric  01:20.5  12.7%  10.26;  Surgeon: Leandrew Koyanagi, MD;  Location: [facility];  Service: Ophthalmology;  Laterality: Left;  . CATARACT EXTRACTION W/PHACO Right 09/20/2019   Procedure: CATARACT EXTRACTION PHACO AND INTRAOCULAR LENS PLACEMENT ([facility]) RIGHT 5.71  00:36.4  15.7%;  Surgeon: Leandrew Koyanagi, MD;  Location: [facility];  Service: Ophthalmology;  Laterality: Right;  . LAPAROTOMY N/A 02/10/2018   Procedure: EXPLORATORY LAPAROTOMY;  Surgeon: Isabel Caprice, MD;  [facility];  Service: Gynecology;  Laterality: N/A;  . MASS EXCISION  01/2018   abdominal  . OVARIAN CYST REMOVAL    . SALPINGOOPHORECTOMY Bilateral 02/10/2018   Procedure: BILATERAL SALPINGO OOPHORECTOMY; PERITONEAL WASHINGS;  Surgeon: Isabel Caprice, MD;  [facility];  Service: Gynecology;  Laterality: Bilateral;  . TONSILLECTOMY    . TONSILLECTOMY AND ADENOIDECTOMY  1953    There were no vitals filed for this visit.   Subjective Assessment - 09/16/20 1349    Subjective Patient reports she has been feeling more dizziness from no notable reason. Patient states she sees her ENT this Thursday.    Pertinent History Patient report unclear regarding onset. Visited MD on Sept 16 and received injection with relief for approximately 1 month. Reports she cannot receive an additional injection  pending eye surgery. Received dx from MD of hip bursitis in B hips. Reports pain with walking, stanindg, sitting 5/10 current 7/10 worst 3/10 best, with temporary relief when she is sitting "just right" and with naproxen. Agg: activity. Cormorbidities: hx of breast cancer, HTN, MCI, insomnia, cataracts    Limitations Sitting;Standing;Walking;House hold activities    How long can you sit comfortably? Pain constant, reduced with neutral pelvic tilt    How long can you stand comfortably?  Pain constant, increases with time    How long can you walk comfortably? Pain constant, increases with time    Patient Stated Goals Be able to move without hurting, walk 1 hour    Currently in Pain? No/denies    Pain Onset More than a month ago                    TREATMENT Neuromuscular re-ed Sitting reaching up, out, down and back - x 15 Backwards walking with eyes following diagonals - 2 x 39ft Backwards walking with finding cones from therapists passing contralaterally - 3 x 66ft Backwards amb with EC in standing - 2 x 26ft Stop and turns with walking and changing directions - x 11 ~137ft Cone pick ups while standing on airex pad -- 2 x 10 with rotational movements      Performed to improve balance and vestibular-based symptoms    PT Education - 09/16/20 1352    Education provided Yes    Education Details form/technique with exercise    Person(s) Educated Patient    Methods Demonstration;Explanation    Comprehension Verbalized understanding;Returned demonstration            PT Short Term Goals - 09/12/19 1419      PT SHORT TERM GOAL #1   Title Patient will demonstrate complaince with HEP to speed recovery and reduce total number of visits.    Baseline HEP given    Time 2    Period Weeks    Status New    Target Date 09/27/19             PT Long Term Goals - 09/03/20 1057      PT LONG TERM GOAL #1   Title Patient will improve hip strength to 4/5 grossly to facilitate improved mobility with ADLs.    Baseline 3, 4-; 12/22 deferred to NV; 11/09/2019: 4-/5; 12/07/2019 L hip flexor 4, abd and ext 3+; 01/29/2020: 4/5 for most hip motions, 3+/5 for abd, ext; 03/20/2020: 4/5 for hip movements    Time 8    Period Weeks    Status Achieved      PT LONG TERM GOAL #2   Title Patient will demonstrate walking tolerance increased to 30 minutes without increase in symptoms to demonstrate improved ability to perform walking program for cardiovascular health.    Baseline 10  minute tolerance (patient report); 12/22 10 minutes (per pt report); 11/09/2019: 7 min; 12/07/2019 unable to assess due to icy streets;  01/29/2020: 10 min; 03/20/2020 10 min; 05/14/2020: 10 min x 3 per day; 06/11/2020: 60min x 3 per day; 07/15/2020: 89min x 3 per day; 09/02/2020: 23min x 3 (just standing rest breaks in between)    Time 8    Period Weeks    Status On-going      PT LONG TERM GOAL #3   Title Patient will report worst pain of 3/10 during therapy to demonstrate reduced disability    Baseline Worst 7/10; 12/22 worst 9/10; 11/09/2019: 9/10; 12/07/2019 7/10;  01/29/2020: 7/10; 05/14/2020: 0/ 10    Time 8    Period Weeks    Status Achieved      PT LONG TERM GOAL #4   Title Patient will demonstrate ability to perform all bed mobility maneuvers without increase in pain for reduced disability with mobility and transfers with ADLs.    Baseline Reports pain with bed mobility supine <> sidelying L/R, sidelying to sitting EOB; 12/22 pt able to perform I without increased pain    Time 8    Period Weeks    Status Achieved      PT LONG TERM GOAL #5   Title Patient will improve 10MW speed to 1.0 m/sec to meet norms for minimal fall risk and full community ambulation.    Baseline 0.88 m/sec; 12/22 0.96 m/sec; 11/09/2019: deferred; 12/07/2019 0.90 m/sec; 01/29/2020: .9  m/sec; 03/20/2020: .63m/s; 05/14/2020: . 9 m/s; 06/11/2020: .65m/s; 07/15/2020: 1 m/s    Time 8    Period Weeks    Status Achieved      PT LONG TERM GOAL #6   Title Patient will increase 6MWT by 200 ft to demonstrate improvement in functional capacity for transition to cardiac walking program    Baseline 6MWT = 1195; 1/7/20201: deferred; 12/07/2019 1095; 01/29/2020: deferred secondary to fatigue; 03/20/2020: 1081ft; 07/15/2020: 1173ft; 09/03/2020: 843ft    Time 8    Period Weeks    Status On-going      PT LONG TERM GOAL #7   Title Patient will improve her 5xsts to under 15 sec to indicate singificnat improvement in LE functional strength and decrease in  fall risk.    Baseline 24.5 sec; 03/20/2020: 19.19 sec; 06/13/2020: 14. 92    Time 6    Period Weeks    Status Achieved      PT LONG TERM GOAL #8   Title Patient will improve FGA to 28/30 to indicate improvement in dynamic balance and improvement in stability.    Baseline 23/30; 06/11/2020: 25/30; 07/15/2020: 26/30; 09/02/2020: 27/30    Time 6    Period Weeks    Status On-going                 Plan - 09/16/20 1741    Clinical Impression Statement Able to perform greater amount of exercises during today's session compared to the previous session. Continues to requiring sitting rest breaks throughout the session however less than previous sessions. Patient will benefit from further skilled therapy to return to prior level of function.    Personal Factors and Comorbidities Age;Comorbidity 3+;Time since onset of injury/illness/exacerbation;Comorbidity 2    Comorbidities MCI, HTN, CA, cataracts    Examination-Activity Limitations Bend;Sit;Squat;Stairs;Stand;Sleep    Examination-Participation Restrictions Cleaning;Community Activity    Stability/Clinical Decision Making Evolving/Moderate complexity    Rehab Potential Fair    Clinical Impairments Affecting Rehab Potential MCI    PT Frequency 2x / week    PT Duration 6 weeks    PT Treatment/Interventions ADLs/Self Care Home Management;Therapeutic exercise;Patient/family education;Neuromuscular re-education;Therapeutic activities;Functional mobility training;Balance training;Manual techniques;Gait training;Stair training;Moist Heat;Electrical Stimulation;Cryotherapy;Taping;Dry needling;Energy conservation;Passive range of motion;Joint Manipulations    PT Next Visit Plan Progress glute therex, pelvic-hip dissociation; check and progress HEP with stretches    PT Home Exercise Plan lateral step ups, step ups, hip kickers ext/abd red theraband    Consulted and Agree with Plan of Care Patient           Patient will benefit from skilled  therapeutic intervention in order to improve the following deficits  and impairments:  Decreased range of motion, Postural dysfunction, Pain, Abnormal gait, Decreased activity tolerance, Decreased coordination, Decreased endurance, Decreased balance, Decreased mobility, Difficulty walking, Hypomobility, Increased muscle spasms, Impaired perceived functional ability, Decreased strength, Impaired flexibility  Visit Diagnosis: Muscle weakness (generalized)  Difficulty in walking, not elsewhere classified     Problem List Patient Active Problem List   Diagnosis Date Noted  . Osteopenia of neck of right femur 09/06/2020  . Class 1 obesity with serious comorbidity and body mass index (BMI) of 32.0 to 32.9 in adult 08/29/2020  . Candidal skin infection 04/29/2020  . Parkinson disease ([place]) 02/22/2020  . 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 Avitaminosis D 08/24/2019  . Insomnia due to mental condition 05/31/2019  . Bipolar I disorder, most recent episode depressed ([place]) 05/31/2019  . Mild cognitive disorder 05/31/2019  . Alcohol use disorder, moderate, in sustained remission ([place]) 05/31/2019  . Elevated tumor markers 05/27/2019  . GAD (generalized anxiety disorder) 04/10/2019  . MDD (major depressive disorder), severe ([place]) 01/31/2019  . High serum carbohydrate antigen 19-9 (CA19-9) 12/01/2018  . Adjustment disorder with anxious mood 11/24/2018  . Essential hypertension 11/24/2018  . B12 deficiency 10/17/2018  . Incisional hernia, without obstruction or gangrene 09/12/2018  . Genetic testing 04/07/2018  . Malignant neoplasm of upper-outer quadrant of right breast in female, estrogen receptor positive ([place]) 03/17/2018  . Malignant neoplasm of upper-inner quadrant of right breast in female, estrogen receptor positive ([place]) 03/17/2018  . History of psychosis 02/07/2018  . History of insomnia 02/07/2018    Blythe Stanford, PT DPT 09/16/2020, 5:59 PM  [facility] [address] [place], [address] Phone: [phone]   Fax:  [phone]  Name: Keidy Thurgood MRN: [id number] Date of Birth: December 16, 1945

## 2020-09-17 LAB — COLOGUARD: Cologuard: POSITIVE — AB

## 2020-09-18 ENCOUNTER — Ambulatory Visit: Payer: Medicare PPO

## 2020-09-18 DIAGNOSIS — R262 Difficulty in walking, not elsewhere classified: Secondary | ICD-10-CM | POA: Diagnosis not present

## 2020-09-18 DIAGNOSIS — M6281 Muscle weakness (generalized): Secondary | ICD-10-CM

## 2020-09-18 NOTE — Therapy (Signed)
[facility] [address] [place], [address] Phone: [phone]   Fax:  [phone]  Physical Therapy Treatment  Patient Details  Name: Kendra [name] MRN: [id number] Date of Birth: 1945/12/24 No data recorded  Encounter Date: 09/18/2020   PT End of Session - 09/18/20 1408    Visit Number 69    Number of Visits 70    Date for PT Re-Evaluation 10/28/20    PT Start Time 0258    PT Stop Time 1430    PT Time Calculation (min) 43 min    Activity Tolerance Patient tolerated treatment well;No increased pain    Behavior During Therapy WFL for tasks assessed/performed           Past Medical History:  Diagnosis Date  . Anxiety   . Breast cancer ([place]) 03/2018   right breast cancer at 11:00 and 1:00  . Bursitis    bilateral hips and knees  . Complication of anesthesia   . Depression   . Family history of breast cancer 03/24/2018  . Family history of lung cancer 03/24/2018  . History of alcohol dependence ([place]) 2007   no ETOH; resolved since 2007  . History of hiatal hernia   . History of psychosis 2014   due to sleep disturbance  . Hypertension   . Parkinson disease ([place])   . Personal history of radiation therapy 06/2018-07/2018   right breast ca  . PONV (postoperative nausea and vomiting)     Past Surgical History:  Procedure Laterality Date  . BREAST BIOPSY Right 03/14/2018   11:00 DCIS and invasive ductal carcinoma  . BREAST BIOPSY Right 03/14/2018   1:00 Invasive ductal carcinoma  . BREAST LUMPECTOMY Right 04/27/2018   lumpectomy of 11 and 1:00 cancers, clear margins, negative LN  . BREAST LUMPECTOMY WITH RADIOACTIVE SEED AND SENTINEL LYMPH NODE BIOPSY Right 04/27/2018   Procedure: RIGHT BREAST LUMPECTOMY WITH RADIOACTIVE SEED X 2 AND RIGHT SENTINEL LYMPH NODE BIOPSY;  Surgeon: [name], MD;  Location: [facility];  Service: General;  Laterality: Right;  . CATARACT  EXTRACTION W/PHACO Left 08/30/2019   Procedure: CATARACT EXTRACTION PHACO AND INTRAOCULAR LENS PLACEMENT ([place]) LEFT panoptix toric  01:20.5  12.7%  10.26;  Surgeon: Leandrew Koyanagi, MD;  Location: [facility];  Service: Ophthalmology;  Laterality: Left;  . CATARACT EXTRACTION W/PHACO Right 09/20/2019   Procedure: CATARACT EXTRACTION PHACO AND INTRAOCULAR LENS PLACEMENT ([facility]) RIGHT 5.71  00:36.4  15.7%;  Surgeon: Leandrew Koyanagi, MD;  Location: [facility];  Service: Ophthalmology;  Laterality: Right;  . LAPAROTOMY N/A 02/10/2018   Procedure: EXPLORATORY LAPAROTOMY;  Surgeon: Isabel Caprice, MD;  Location: [facility];  Service: Gynecology;  Laterality: N/A;  . MASS EXCISION  01/2018   abdominal  . OVARIAN CYST REMOVAL    . SALPINGOOPHORECTOMY Bilateral 02/10/2018   Procedure: BILATERAL SALPINGO OOPHORECTOMY; PERITONEAL WASHINGS;  Surgeon: Isabel Caprice, MD;  Location: [facility];  Service: Gynecology;  Laterality: Bilateral;  . TONSILLECTOMY    . TONSILLECTOMY AND ADENOIDECTOMY  1953    There were no vitals filed for this visit.   Subjective Assessment - 09/18/20 1355    Subjective Patient states no major changes since the previous session.    Pertinent History Patient report unclear regarding onset. Visited MD on Sept 16 and received injection with relief for approximately 1 month. Reports she cannot receive an additional injection pending eye surgery. Received dx from MD of hip bursitis in  B hips. Reports pain with walking, stanindg, sitting 5/10 current 7/10 worst 3/10 best, with temporary relief when she is sitting "just right" and with naproxen. Agg: activity. Cormorbidities: hx of breast cancer, HTN, MCI, insomnia, cataracts    Limitations Sitting;Standing;Walking;House hold activities    How long can you sit comfortably? Pain constant, reduced with neutral pelvic tilt    How long can you stand comfortably? Pain constant, increases with time    How long can you walk  comfortably? Pain constant, increases with time    Patient Stated Goals Be able to move without hurting, walk 1 hour    Currently in Pain? No/denies    Pain Onset More than a month ago                     TREATMENT Neuromuscular re-ed Sitting reaching up, out, down and back - x 15 Stop and turns with walking forward -  2 x 77ft Backwards walking with finding cones from therapists passing contralaterally - 2 x 74ft Walking with rotations - 3 x 76ft Side stepping with ball toss - 2 x 66ft Backwards amb with EC in standing - 2 x 56ft Backwards amb with ball toss outside of range of motion - 2 x 26ft    Performed to improve balance and vestibular-based symptoms      PT Education - 09/18/20 1357    Education provided Yes    Education Details form/technique with exercise    Person(s) Educated Patient    Methods Demonstration;Explanation    Comprehension Returned demonstration;Verbalized understanding            PT Short Term Goals - 09/12/19 1419      PT SHORT TERM GOAL #1   Title Patient will demonstrate complaince with HEP to speed recovery and reduce total number of visits.    Baseline HEP given    Time 2    Period Weeks    Status New    Target Date 09/27/19             PT Long Term Goals - 09/03/20 1057      PT LONG TERM GOAL #1   Title Patient will improve hip strength to 4/5 grossly to facilitate improved mobility with ADLs.    Baseline 3, 4-; 12/22 deferred to NV; 11/09/2019: 4-/5; 12/07/2019 L hip flexor 4, abd and ext 3+; 01/29/2020: 4/5 for most hip motions, 3+/5 for abd, ext; 03/20/2020: 4/5 for hip movements    Time 8    Period Weeks    Status Achieved      PT LONG TERM GOAL #2   Title Patient will demonstrate walking tolerance increased to 30 minutes without increase in symptoms to demonstrate improved ability to perform walking program for cardiovascular health.    Baseline 10 minute tolerance (patient report); 12/22 10 minutes (per pt report);  11/09/2019: 7 min; 12/07/2019 unable to assess due to icy streets;  01/29/2020: 10 min; 03/20/2020 10 min; 05/14/2020: 10 min x 3 per day; 06/11/2020: 3min x 3 per day; 07/15/2020: 43min x 3 per day; 09/02/2020: 38min x 3 (just standing rest breaks in between)    Time 8    Period Weeks    Status On-going      PT LONG TERM GOAL #3   Title Patient will report worst pain of 3/10 during therapy to demonstrate reduced disability    Baseline Worst 7/10; 12/22 worst 9/10; 11/09/2019: 9/10; 12/07/2019 7/10; 01/29/2020: 7/10; 05/14/2020: 0/ 10  Time 8    Period Weeks    Status Achieved      PT LONG TERM GOAL #4   Title Patient will demonstrate ability to perform all bed mobility maneuvers without increase in pain for reduced disability with mobility and transfers with ADLs.    Baseline Reports pain with bed mobility supine <> sidelying L/R, sidelying to sitting EOB; 12/22 pt able to perform I without increased pain    Time 8    Period Weeks    Status Achieved      PT LONG TERM GOAL #5   Title Patient will improve 10MW speed to 1.0 m/sec to meet norms for minimal fall risk and full community ambulation.    Baseline 0.88 m/sec; 12/22 0.96 m/sec; 11/09/2019: deferred; 12/07/2019 0.90 m/sec; 01/29/2020: .9  m/sec; 03/20/2020: .43m/s; 05/14/2020: . 9 m/s; 06/11/2020: .80m/s; 07/15/2020: 1 m/s    Time 8    Period Weeks    Status Achieved      PT LONG TERM GOAL #6   Title Patient will increase 6MWT by 200 ft to demonstrate improvement in functional capacity for transition to cardiac walking program    Baseline 6MWT = 1195; 1/7/20201: deferred; 12/07/2019 1095; 01/29/2020: deferred secondary to fatigue; 03/20/2020: 1016ft; 07/15/2020: 1125ft; 09/03/2020: 86ft    Time 8    Period Weeks    Status On-going      PT LONG TERM GOAL #7   Title Patient will improve her 5xsts to under 15 sec to indicate singificnat improvement in LE functional strength and decrease in fall risk.    Baseline 24.5 sec; 03/20/2020: 19.19 sec; 06/13/2020:  14. 92    Time 6    Period Weeks    Status Achieved      PT LONG TERM GOAL #8   Title Patient will improve FGA to 28/30 to indicate improvement in dynamic balance and improvement in stability.    Baseline 23/30; 06/11/2020: 25/30; 07/15/2020: 26/30; 09/02/2020: 27/30    Time 6    Period Weeks    Status On-going                 Plan - 09/18/20 1435    Clinical Impression Statement Patient making improvement with performance of rotational movements although she continues to have increased onset of symptoms with performance of exercises. Will continue to focus on improving this as well as ambulating backwards. Patient will benefit from further skilled therapy to return to prior level of function.    Personal Factors and Comorbidities Age;Comorbidity 3+;Time since onset of injury/illness/exacerbation;Comorbidity 2    Comorbidities MCI, HTN, CA, cataracts    Examination-Activity Limitations Bend;Sit;Squat;Stairs;Stand;Sleep    Examination-Participation Restrictions Cleaning;Community Activity    Stability/Clinical Decision Making Evolving/Moderate complexity    Rehab Potential Fair    Clinical Impairments Affecting Rehab Potential MCI    PT Frequency 2x / week    PT Duration 6 weeks    PT Treatment/Interventions ADLs/Self Care Home Management;Therapeutic exercise;Patient/family education;Neuromuscular re-education;Therapeutic activities;Functional mobility training;Balance training;Manual techniques;Gait training;Stair training;Moist Heat;Electrical Stimulation;Cryotherapy;Taping;Dry needling;Energy conservation;Passive range of motion;Joint Manipulations    PT Next Visit Plan Progress glute therex, pelvic-hip dissociation; check and progress HEP with stretches    PT Home Exercise Plan lateral step ups, step ups, hip kickers ext/abd red theraband    Consulted and Agree with Plan of Care Patient           Patient will benefit from skilled therapeutic intervention in order to improve  the following deficits and impairments:  Decreased  range of motion, Postural dysfunction, Pain, Abnormal gait, Decreased activity tolerance, Decreased coordination, Decreased endurance, Decreased balance, Decreased mobility, Difficulty walking, Hypomobility, Increased muscle spasms, Impaired perceived functional ability, Decreased strength, Impaired flexibility  Visit Diagnosis: Muscle weakness (generalized)  Difficulty in walking, not elsewhere classified     Problem List Patient Active Problem List   Diagnosis Date Noted  . Osteopenia of neck of right femur 09/06/2020  . Class 1 obesity with serious comorbidity and body mass index (BMI) of 32.0 to 32.9 in adult 08/29/2020  . Candidal skin infection 04/29/2020  . Parkinson disease ([place]) 02/22/2020  . 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 Avitaminosis D 08/24/2019  . Insomnia due to mental condition 05/31/2019  . Bipolar I disorder, most recent episode depressed ([place]) 05/31/2019  . Mild cognitive disorder 05/31/2019  . Alcohol use disorder, moderate, in sustained remission ([place]) 05/31/2019  . Elevated tumor markers 05/27/2019  . GAD (generalized anxiety disorder) 04/10/2019  . MDD (major depressive disorder), severe ([place]) 01/31/2019  . High serum carbohydrate antigen 19-9 (CA19-9) 12/01/2018  . Adjustment disorder with anxious mood 11/24/2018  . Essential hypertension 11/24/2018  . B12 deficiency 10/17/2018  . Incisional hernia, without obstruction or gangrene 09/12/2018  . Genetic testing 04/07/2018  . Malignant neoplasm of upper-outer quadrant of right breast in female, estrogen receptor positive ([place]) 03/17/2018  . Malignant neoplasm of upper-inner quadrant of right breast in female, estrogen receptor positive ([place]) 03/17/2018  . History of psychosis 02/07/2018  . History of insomnia 02/07/2018    Blythe Stanford, PT DPT 09/18/2020, 2:44 PM  [facility] [address], [place], [address] Phone: [phone]   Fax:  [phone]  Name: Kendra [name] MRN: [id number] Date of Birth: 08-Sep-1946

## 2020-09-19 DIAGNOSIS — H903 Sensorineural hearing loss, bilateral: Secondary | ICD-10-CM | POA: Diagnosis not present

## 2020-09-19 DIAGNOSIS — H6123 Impacted cerumen, bilateral: Secondary | ICD-10-CM | POA: Diagnosis not present

## 2020-09-24 ENCOUNTER — Ambulatory Visit: Payer: Medicare PPO | Admitting: Licensed Clinical Social Worker

## 2020-10-02 DIAGNOSIS — H903 Sensorineural hearing loss, bilateral: Secondary | ICD-10-CM | POA: Diagnosis not present

## 2020-10-03 ENCOUNTER — Other Ambulatory Visit: Payer: Self-pay

## 2020-10-03 ENCOUNTER — Encounter: Payer: Self-pay | Admitting: Psychiatry

## 2020-10-03 ENCOUNTER — Ambulatory Visit (INDEPENDENT_AMBULATORY_CARE_PROVIDER_SITE_OTHER): Payer: Medicare PPO | Admitting: Licensed Clinical Social Worker

## 2020-10-03 ENCOUNTER — Telehealth (INDEPENDENT_AMBULATORY_CARE_PROVIDER_SITE_OTHER): Payer: Medicare PPO | Admitting: Psychiatry

## 2020-10-03 DIAGNOSIS — F411 Generalized anxiety disorder: Secondary | ICD-10-CM | POA: Diagnosis not present

## 2020-10-03 DIAGNOSIS — F5105 Insomnia due to other mental disorder: Secondary | ICD-10-CM

## 2020-10-03 DIAGNOSIS — F3176 Bipolar disorder, in full remission, most recent episode depressed: Secondary | ICD-10-CM

## 2020-10-03 NOTE — Progress Notes (Signed)
Virtual Visit via Telephone Note  I connected with [name] on 10/03/20 at 12:30 PM EST by telephone and verified that I am speaking with the correct person using two identifiers.  Location: Patient: home Provider: [facility]   I discussed the limitations, risks, security and privacy concerns of performing an evaluation and management service by telephone and the availability of in person appointments. I also discussed with the patient that there may be a patient responsible charge related to this service. The patient expressed understanding and agreed to proceed.   The patient was advised to call back or seek an in-person evaluation if the symptoms worsen or if the condition fails to improve as anticipated.  I provided 45 minutes of non-face-to-face time during this encounter.   [place], LCSW   THERAPIST PROGRESS NOTE  Session Time: 12:30-1:15p  Participation Level: Active  Behavioral Response: NAAlertAnxious  Type of Therapy: Individual Therapy  Treatment Goals addressed: Coping  Interventions: Solution Focused and Supportive  Summary: Kevionna Heffler is a 74 y.o. female who presents with improving symptoms related to bipolar disorder diagnosis. Pt reports that she is managing anxiety/stress well. Pt is working hard to improve overall self awareness and identify emotional triggers and plan accordingly.  Allowed pt to explore and express thoughts and feelings about recent trip to the beach with relatives to celebrate thanksgiving holiday. Pt reports that it was very tiring (11 days plus 4 days traveling). Pt is eager to get back to daily routine/structure. Pt identified that she is having anxiety about recent diagnosis of hearing loss--excited about getting hearing aids to help with this.  Allowed pt to reflect back on year 2021 and discuss pros, cons, and obstacles that pt has faced throughout the year. Pt seemed to enjoy exercise and stated that she feels that her  personal growth has progressed throughout the year. Pt is ready to "maintain" that progress and just enjoy stabilization for a while.   Encouraged pt to continue being intentional about self care, continue positive social interactions, plan for triggering situations (when husband goes out of the home for any reason), and continue making health a priority.  Suicidal/Homicidal: No  SI, HI, or AVH reported at time of session.  Therapist Response: Ellyanna continues to make good progress with self understanding, insight, and life management. Pt is being intentional about self care and maintaining positive relationships. This is reflective of overall progress and maintaining good quality of life. Treatment showing good evolution and development.  Plan: Return again in 4 weeks. The ongoing treatment plan includes maintaining current levels of progress and continuing to build skills to manage mood, improve stress/anxiety management, emotion regulation, distress tolerance, and behavior modification.   Diagnosis: Axis I: Bipolar, Depressed and Generalized Anxiety Disorder    Axis II: No diagnosis    [name] Dillen [place], LCSW 10/03/2020

## 2020-10-03 NOTE — Progress Notes (Signed)
Virtual Visit via Telephone Note  I connected with Kendra [name] on 10/03/20 at  2:00 PM EST by telephone and verified that I am speaking with the correct person using two identifiers.  Location Provider Location : [facility] Patient Location : Home  Participants: Patient , Provider   I discussed the limitations, risks, security and privacy concerns of performing an evaluation and management service by telephone and the availability of in person appointments. I also discussed with the patient that there may be a patient responsible charge related to this service. The patient expressed understanding and agreed to proceed   I discussed the assessment and treatment plan with the patient. The patient was provided an opportunity to ask questions and all were answered. The patient agreed with the plan and demonstrated an understanding of the instructions.   The patient was advised to call back or seek an in-person evaluation if the symptoms worsen or if the condition fails to improve as anticipated.   [place] MD OP Progress Note  10/03/2020 2:29 PM Bettejane Leavens  MRN:  [id number]  Chief Complaint:  Chief Complaint    Follow-up     HPI: Kendra [name] is a 74 year old Caucasian female, married, lives in [place], has a history of bipolar disorder, GAD, insomnia, cognitive disorder, multifocal stage I right breast cancer status post treatment-lumpectomy, ovarian cyst removal, Parkinson's disease, bilateral hearing loss was evaluated by phone today.  Patient today reports she is currently doing well.  She does have some anxiety about her current health issues.  She was recently diagnosed with bilateral hearing loss.  She is currently waiting for her hearing aid to come.  She reports that does make her anxious however she has been coping with life well.  Patient reports mood wise she is doing okay.  Denies any mood swings.  She reports sleep is good on the current medication regimen.  She reports she  takes 1 to 2 tablets of trazodone and also is currently on an extended release of Sinemet which does help to sleep through the night.  She denies side effects to any of her medications.  Patient reports she is compliant with psychotherapy sessions and they are beneficial.  Visit Diagnosis:    ICD-10-CM   1. Bipolar disorder, in full remission, most recent episode depressed ([place])  F31.76   2. GAD (generalized anxiety disorder)  F41.1   3. Insomnia due to mental condition  F51.05     Past Psychiatric History: I have reviewed past psychiatric history from my progress note on 03/01/2019.  Past trials of risperidone, Prozac, Seroquel, Ativan  Past Medical History:  Past Medical History:  Diagnosis Date  . Anxiety   . Breast cancer ([place]) 03/2018   right breast cancer at 11:00 and 1:00  . Bursitis    bilateral hips and knees  . Complication of anesthesia   . Depression   . Family history of breast cancer 03/24/2018  . Family history of lung cancer 03/24/2018  . History of alcohol dependence ([place]) 2007   no ETOH; resolved since 2007  . History of hiatal hernia   . History of psychosis 2014   due to sleep disturbance  . Hypertension   . Parkinson disease ([place])   . Personal history of radiation therapy 06/2018-07/2018   right breast ca  . PONV (postoperative nausea and vomiting)     Past Surgical History:  Procedure Laterality Date  . BREAST BIOPSY Right 03/14/2018   11:00 DCIS and invasive ductal carcinoma  .  BREAST BIOPSY Right 03/14/2018   1:00 Invasive ductal carcinoma  . BREAST LUMPECTOMY Right 04/27/2018   lumpectomy of 11 and 1:00 cancers, clear margins, negative LN  . BREAST LUMPECTOMY WITH RADIOACTIVE SEED AND SENTINEL LYMPH NODE BIOPSY Right 04/27/2018   Procedure: RIGHT BREAST LUMPECTOMY WITH RADIOACTIVE SEED X 2 AND RIGHT SENTINEL LYMPH NODE BIOPSY;  Surgeon: [name], MD;  Location: [facility];  Service: General;  Laterality: Right;  . CATARACT  EXTRACTION W/PHACO Left 08/30/2019   Procedure: CATARACT EXTRACTION PHACO AND INTRAOCULAR LENS PLACEMENT ([place]) LEFT panoptix toric  01:20.5  12.7%  10.26;  Surgeon: Leandrew Koyanagi, MD;  Location: [facility];  Service: Ophthalmology;  Laterality: Left;  . CATARACT EXTRACTION W/PHACO Right 09/20/2019   Procedure: CATARACT EXTRACTION PHACO AND INTRAOCULAR LENS PLACEMENT ([facility]) RIGHT 5.71  00:36.4  15.7%;  Surgeon: Leandrew Koyanagi, MD;  Location: [facility];  Service: Ophthalmology;  Laterality: Right;  . LAPAROTOMY N/A 02/10/2018   Procedure: EXPLORATORY LAPAROTOMY;  Surgeon: Isabel Caprice, MD;  Location: [facility];  Service: Gynecology;  Laterality: N/A;  . MASS EXCISION  01/2018   abdominal  . OVARIAN CYST REMOVAL    . SALPINGOOPHORECTOMY Bilateral 02/10/2018   Procedure: BILATERAL SALPINGO OOPHORECTOMY; PERITONEAL WASHINGS;  Surgeon: Isabel Caprice, MD;  Location: [facility];  Service: Gynecology;  Laterality: Bilateral;  . TONSILLECTOMY    . TONSILLECTOMY AND ADENOIDECTOMY  1953    Family Psychiatric History: I have reviewed family psychiatric history from my progress note on 03/01/2019  Family History:  Family History  Problem Relation Age of Onset  . Diabetes Mother   . Breast cancer Mother 58  . Hypertension Mother   . Lung cancer Father        asbestos exposure  . Heart disease Brother 45  . Breast cancer Cousin 67       paternal cousin  . Heart disease Paternal Grandmother 17  . Heart disease Paternal Grandfather     Social History: Reviewed social history from my progress note on 03/01/2019 Social History   Socioeconomic History  . Marital status: Married    Spouse name: robert  . Number of children: 0  . Years of education: Not on file  . Highest education level: Some college, no degree  Occupational History  . Occupation: retired Furniture conservator/restorer of education  Tobacco Use  . Smoking status: Never Smoker  . Smokeless tobacco: Never  Used  Vaping Use  . Vaping Use: Never used  Substance and Sexual Activity  . Alcohol use: Not Currently    Comment: alcohol dependence prior to 2007  . Drug use: Never  . Sexual activity: Yes    Partners: Male    Birth control/protection: Surgical  Other Topics Concern  . Not on file  Social History Narrative  . Not on file   Social Determinants of Health   Financial Resource Strain: Low Risk   . Difficulty of Paying Living Expenses: Not hard at all  Food Insecurity: No Food Insecurity  . Worried About Charity fundraiser in the Last Year: Never true  . Ran Out of Food in the Last Year: Never true  Transportation Needs: No Transportation Needs  . Lack of Transportation (Medical): No  . Lack of Transportation (Non-Medical): No  Physical Activity: Inactive  . Days of Exercise per Week: 0 days  . Minutes of Exercise per Session: 0 min  Stress: Stress Concern Present  . Feeling of Stress : To some  extent  Social Connections: Moderately Integrated  . Frequency of Communication with Friends and Family: More than three times a week  . Frequency of Social Gatherings with Friends and Family: More than three times a week  . Attends Religious Services: Never  . Active Member of Clubs or Organizations: Yes  . Attends Archivist Meetings: 1 to 4 times per year  . Marital Status: Married    Allergies:  Allergies  Allergen Reactions  . Hydroxyzine Anaphylaxis    Tongue swollen  . Other Other (See Comments)    Food poisoning   . Shrimp [Shellfish Allergy] Other (See Comments)    Food poisoning     Metabolic Disorder Labs: Lab Results  Component Value Date   HGBA1C 5.4 04/13/2019   Lab Results  Component Value Date   PROLACTIN 101.9 06/20/2015   Lab Results  Component Value Date   CHOL 173 04/13/2019   TRIG 78 04/13/2019   HDL 62 04/13/2019   LDLCALC 95 04/13/2019   LDLCALC 103 06/02/2017   Lab Results  Component Value Date   TSH 2.31 04/13/2019   TSH  2.543 07/25/2018    Therapeutic Level Labs: No results found for: LITHIUM No results found for: VALPROATE No components found for:  CBMZ  Current Medications: Current Outpatient Medications  Medication Sig Dispense Refill  . Acetaminophen 500 MG capsule Take 1,000 mg by mouth every 6 (six) hours as needed.     . carbidopa-levodopa (SINEMET IR) 25-100 MG tablet Take 2 tablets by mouth 4 (four) times daily.     . carbidopa-levodopa (SINEMET IR) 25-100 MG tablet Take 1 tablet by mouth at bedtime.     . cyanocobalamin (,VITAMIN B-12,) 1000 MCG/ML injection Inject 1 mL (1,000 mcg total) into the muscle every 30 (thirty) days. 3 mL 3  . FLUoxetine HCl 60 MG TABS Take 60 mg by mouth daily. 90 tablet 1  . letrozole (FEMARA) 2.5 MG tablet Take 1 tablet (2.5 mg total) by mouth daily. 90 tablet 3  . lisinopril-hydrochlorothiazide (ZESTORETIC) 20-12.5 MG tablet Take 1 tablet by mouth daily. 90 tablet 3  . naproxen sodium (ALEVE) 220 MG tablet Take 220 mg by mouth 2 (two) times daily as needed.     . nystatin (MYCOSTATIN/NYSTOP) powder Apply 1 application topically 3 (three) times daily. 15 g 1  . polyethylene glycol (MIRALAX / GLYCOLAX) 17 g packet Take 17 g by mouth daily.    . risperiDONE (RISPERDAL) 2 MG tablet Take 0.5 tablets (1 mg total) by mouth 2 (two) times daily. 90 tablet 3  . traZODone (DESYREL) 50 MG tablet Take 1.5-2 tablets (75-100 mg total) by mouth at bedtime as needed for sleep. 180 tablet 1   Current Facility-Administered Medications  Medication Dose Route Frequency Provider Last Rate Last Admin  . lidocaine (PF) (XYLOCAINE) 1 % injection 4 mL  4 mL Intradermal Once Virginia Crews, MD      . lidocaine (PF) (XYLOCAINE) 1 % injection 4 mL  4 mL Intradermal Once Bacigalupo, Dionne Bucy, MD         Musculoskeletal: Strength & Muscle Tone: [place]: UTA Patient leans: N/A  Psychiatric Specialty Exam: Review of Systems  HENT: Positive for hearing loss (Bilateral).    Psychiatric/Behavioral: The patient is nervous/anxious.   All other systems reviewed and are negative.   There were no vitals taken for this visit.There is no height or weight on file to calculate BMI.  General Appearance: UTA  Eye Contact:  UTA  Speech:  Clear and Coherent  Volume:  Normal  Mood:  Anxious coping well  Affect:  UTA  Thought Process:  Goal Directed and Descriptions of Associations: Intact  Orientation:  Full (Time, Place, and Person)  Thought Content: Logical   Suicidal Thoughts:  No  Homicidal Thoughts:  No  Memory:  Immediate;   Fair Recent;   Fair Remote;   Fair  Judgement:  Fair  Insight:  Fair  Psychomotor Activity:  UTA  Concentration:  Concentration: Fair and Attention Span: Fair  Recall:  [employer] of Knowledge: Fair  Language: Fair  Akathisia:  No  Handed:  Right  AIMS (if indicated): UTA  Assets:  Communication Skills Desire for Improvement Housing Social Support  ADL's:  Intact  Cognition: WNL  Sleep:  Fair   Screenings: GAD-7     Counselor 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 [facility]  Total GAD-7 Score 7    PHQ2-9     Clinical Support from 08/29/2020 in [facility] 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 [facility] Office Visit from 11/24/2018 in [facility]  PHQ-2 Total Score 2 3 0  PHQ-9 Total Score 6 17 3        Assessment and Plan: Kendra [name] is a 75 year old Caucasian female, married, lives in [place], has a history of bipolar disorder GAD, insomnia, multiple medical problems, primary Parkinson's disease, hearing loss was evaluated by phone today.  Patient is biologically predisposed given her multiple health issues.  She is currently stable on current medication regimen.  Plan as noted below.  Plan Bipolar disorder in remission Risperidone at reduced dose to 2 mg p.o. daily. Prozac 60 mg p.o. daily.  GAD-stable Continue CBT with Ms.Christina  Hussami Continue Prozac 60 mg p.o. daily  Insomnia-stable Trazodone 50 to 100 mg p.o. nightly as needed Continue melatonin at bedtime as needed  For cognitive disorder-continue follow-up with neurology  Patient is confused about being on Wellbutrin.  She reports it may have been started by her neurologist.  Discussed with patient to check with her neurologist whether she was recently started on Wellbutrin.  She will give their office a call.  Follow-up in clinic in 2 months or sooner if needed.  I have spent atleast 20 minutes non face to face by video with patient today. More than 50 % of the time was spent for preparing to see the patient ( e.g., review of test, records ), ordering medications and test ,psychoeducation and supportive psychotherapy and care coordination,as well as documenting clinical information in electronic health record. This note was generated in part or whole with voice recognition software. Voice recognition is usually quite accurate but there are transcription errors that can and very often do occur. I apologize for any typographical errors that were not detected and corrected.        Ursula Alert, MD 10/03/2020, 2:29 PM

## 2020-10-09 ENCOUNTER — Ambulatory Visit: Payer: Medicare PPO | Attending: Family Medicine

## 2020-10-09 ENCOUNTER — Other Ambulatory Visit: Payer: Self-pay

## 2020-10-09 DIAGNOSIS — M6281 Muscle weakness (generalized): Secondary | ICD-10-CM | POA: Diagnosis not present

## 2020-10-09 DIAGNOSIS — R262 Difficulty in walking, not elsewhere classified: Secondary | ICD-10-CM | POA: Diagnosis not present

## 2020-10-09 DIAGNOSIS — M25552 Pain in left hip: Secondary | ICD-10-CM | POA: Diagnosis not present

## 2020-10-09 DIAGNOSIS — M25551 Pain in right hip: Secondary | ICD-10-CM | POA: Insufficient documentation

## 2020-10-09 NOTE — Therapy (Signed)
[facility] [address] [place], [address] Phone: [phone]   Fax:  [phone]  Physical Therapy Treatment  Patient Details  Name: Kendra [name] MRN: [id number] Date of Birth: 06-20-1946 No data recorded  Encounter Date: 10/09/2020   PT End of Session - 10/09/20 1535    Visit Number 70    Number of Visits 70    Date for PT Re-Evaluation 10/28/20    PT Start Time 4166    PT Stop Time 1600    PT Time Calculation (min) 45 min    Equipment Utilized During Treatment Gait belt    Activity Tolerance Patient tolerated treatment well;No increased pain    Behavior During Therapy WFL for tasks assessed/performed           Past Medical History:  Diagnosis Date  . Anxiety   . Breast cancer ([place]) 03/2018   right breast cancer at 11:00 and 1:00  . Bursitis    bilateral hips and knees  . Complication of anesthesia   . Depression   . Family history of breast cancer 03/24/2018  . Family history of lung cancer 03/24/2018  . History of alcohol dependence ([place]) 2007   no ETOH; resolved since 2007  . History of hiatal hernia   . History of psychosis 2014   due to sleep disturbance  . Hypertension   . Parkinson disease ([place])   . Personal history of radiation therapy 06/2018-07/2018   right breast ca  . PONV (postoperative nausea and vomiting)     Past Surgical History:  Procedure Laterality Date  . BREAST BIOPSY Right 03/14/2018   11:00 DCIS and invasive ductal carcinoma  . BREAST BIOPSY Right 03/14/2018   1:00 Invasive ductal carcinoma  . BREAST LUMPECTOMY Right 04/27/2018   lumpectomy of 11 and 1:00 cancers, clear margins, negative LN  . BREAST LUMPECTOMY WITH RADIOACTIVE SEED AND SENTINEL LYMPH NODE BIOPSY Right 04/27/2018   Procedure: RIGHT BREAST LUMPECTOMY WITH RADIOACTIVE SEED X 2 AND RIGHT SENTINEL LYMPH NODE BIOPSY;  Surgeon: [name], MD;  Location: [facility];  Service:  General;  Laterality: Right;  . CATARACT EXTRACTION W/PHACO Left 08/30/2019   Procedure: CATARACT EXTRACTION PHACO AND INTRAOCULAR LENS PLACEMENT ([place]) LEFT panoptix toric  01:20.5  12.7%  10.26;  Surgeon: Leandrew Koyanagi, MD;  Location: [facility];  Service: Ophthalmology;  Laterality: Left;  . CATARACT EXTRACTION W/PHACO Right 09/20/2019   Procedure: CATARACT EXTRACTION PHACO AND INTRAOCULAR LENS PLACEMENT ([facility]) RIGHT 5.71  00:36.4  15.7%;  Surgeon: Leandrew Koyanagi, MD;  Location: [facility];  Service: Ophthalmology;  Laterality: Right;  . LAPAROTOMY N/A 02/10/2018   Procedure: EXPLORATORY LAPAROTOMY;  Surgeon: Isabel Caprice, MD;  Location[facility];  Service: Gynecology;  Laterality: N/A;  . MASS EXCISION  01/2018   abdominal  . OVARIAN CYST REMOVAL    . SALPINGOOPHORECTOMY Bilateral 02/10/2018   Procedure: BILATERAL SALPINGO OOPHORECTOMY; PERITONEAL WASHINGS;  Surgeon: Isabel Caprice, MD;  [facility];  Service: Gynecology;  Laterality: Bilateral;  . TONSILLECTOMY    . TONSILLECTOMY AND ADENOIDECTOMY  1953    There were no vitals filed for this visit.   Subjective Assessment - 10/09/20 1520    Subjective Patient states the past 4 days minus yesterday and today had been feeling "normal" with no increase in dizziness. however the past two days.    Pertinent History Patient report unclear regarding onset. Visited MD on Sept 16 and received injection  with relief for approximately 1 month. Reports she cannot receive an additional injection pending eye surgery. Received dx from MD of hip bursitis in B hips. Reports pain with walking, stanindg, sitting 5/10 current 7/10 worst 3/10 best, with temporary relief when she is sitting "just right" and with naproxen. Agg: activity. Cormorbidities: hx of breast cancer, HTN, MCI, insomnia, cataracts    Limitations Sitting;Standing;Walking;House hold activities    How long can you sit comfortably? Pain constant, reduced with  neutral pelvic tilt    How long can you stand comfortably? Pain constant, increases with time    How long can you walk comfortably? Pain constant, increases with time    Patient Stated Goals Be able to move without hurting, walk 1 hour    Currently in Pain? No/denies    Pain Onset More than a month ago                TREATMENT Neuromuscular re-ed Sitting reaching up, out, down and back - x 15 Backwards walking with finding cones from therapists passing contralaterally - 2 x 20ft Backward amb with head turns - 2 x 26ft Forward walking with ball toss upward - 2 x 3ft Forward EC ambulation - 2 x 21ft Side stepping with circles - x 119ft Wall rolls - x 5 each direction Tandem amb with airex beam - x 10 23ft Backwards amb with busy backwards - 2 x 107ft  Performed exercises to improve balance limitations     PT Education - 10/09/20 1523    Education provided Yes    Education Details form/technique with exercise    Person(s) Educated Patient    Methods Explanation;Demonstration    Comprehension Verbalized understanding;Returned demonstration            PT Short Term Goals - 09/12/19 1419      PT SHORT TERM GOAL #1   Title Patient will demonstrate complaince with HEP to speed recovery and reduce total number of visits.    Baseline HEP given    Time 2    Period Weeks    Status New    Target Date 09/27/19             PT Long Term Goals - 09/03/20 1057      PT LONG TERM GOAL #1   Title Patient will improve hip strength to 4/5 grossly to facilitate improved mobility with ADLs.    Baseline 3, 4-; 12/22 deferred to NV; 11/09/2019: 4-/5; 12/07/2019 L hip flexor 4, abd and ext 3+; 01/29/2020: 4/5 for most hip motions, 3+/5 for abd, ext; 03/20/2020: 4/5 for hip movements    Time 8    Period Weeks    Status Achieved      PT LONG TERM GOAL #2   Title Patient will demonstrate walking tolerance increased to 30 minutes without increase in symptoms to demonstrate improved ability  to perform walking program for cardiovascular health.    Baseline 10 minute tolerance (patient report); 12/22 10 minutes (per pt report); 11/09/2019: 7 min; 12/07/2019 unable to assess due to icy streets;  01/29/2020: 10 min; 03/20/2020 10 min; 05/14/2020: 10 min x 3 per day; 06/11/2020: 80min x 3 per day; 07/15/2020: 58min x 3 per day; 09/02/2020: 75min x 3 (just standing rest breaks in between)    Time 8    Period Weeks    Status On-going      PT LONG TERM GOAL #3   Title Patient will report worst pain of 3/10 during therapy to  demonstrate reduced disability    Baseline Worst 7/10; 12/22 worst 9/10; 11/09/2019: 9/10; 12/07/2019 7/10; 01/29/2020: 7/10; 05/14/2020: 0/ 10    Time 8    Period Weeks    Status Achieved      PT LONG TERM GOAL #4   Title Patient will demonstrate ability to perform all bed mobility maneuvers without increase in pain for reduced disability with mobility and transfers with ADLs.    Baseline Reports pain with bed mobility supine <> sidelying L/R, sidelying to sitting EOB; 12/22 pt able to perform I without increased pain    Time 8    Period Weeks    Status Achieved      PT LONG TERM GOAL #5   Title Patient will improve 10MW speed to 1.0 m/sec to meet norms for minimal fall risk and full community ambulation.    Baseline 0.88 m/sec; 12/22 0.96 m/sec; 11/09/2019: deferred; 12/07/2019 0.90 m/sec; 01/29/2020: .9  m/sec; 03/20/2020: .24m/s; 05/14/2020: . 9 m/s; 06/11/2020: .52m/s; 07/15/2020: 1 m/s    Time 8    Period Weeks    Status Achieved      PT LONG TERM GOAL #6   Title Patient will increase 6MWT by 200 ft to demonstrate improvement in functional capacity for transition to cardiac walking program    Baseline 6MWT = 1195; 1/7/20201: deferred; 12/07/2019 1095; 01/29/2020: deferred secondary to fatigue; 03/20/2020: 10101ft; 07/15/2020: 1177ft; 09/03/2020: 81ft    Time 8    Period Weeks    Status On-going      PT LONG TERM GOAL #7   Title Patient will improve her 5xsts to under 15 sec to  indicate singificnat improvement in LE functional strength and decrease in fall risk.    Baseline 24.5 sec; 03/20/2020: 19.19 sec; 06/13/2020: 14. 92    Time 6    Period Weeks    Status Achieved      PT LONG TERM GOAL #8   Title Patient will improve FGA to 28/30 to indicate improvement in dynamic balance and improvement in stability.    Baseline 23/30; 06/11/2020: 25/30; 07/15/2020: 26/30; 09/02/2020: 27/30    Time 6    Period Weeks    Status On-going                 Plan - 10/09/20 1553    Clinical Impression Statement Patient with less required cueing to maintain balance witth walking backwards indicating functional carryover between sessions. Continues to have onset of symptoms with rotational movements which seem to challenge her vestibular system to the highest degree. Will continue to benefit from further skilled therapy to prior level of function.    Personal Factors and Comorbidities Age;Comorbidity 3+;Time since onset of injury/illness/exacerbation;Comorbidity 2    Comorbidities MCI, HTN, CA, cataracts    Examination-Activity Limitations Bend;Sit;Squat;Stairs;Stand;Sleep    Examination-Participation Restrictions Cleaning;Community Activity    Stability/Clinical Decision Making Evolving/Moderate complexity    Rehab Potential Fair    Clinical Impairments Affecting Rehab Potential MCI    PT Frequency 2x / week    PT Duration 6 weeks    PT Treatment/Interventions ADLs/Self Care Home Management;Therapeutic exercise;Patient/family education;Neuromuscular re-education;Therapeutic activities;Functional mobility training;Balance training;Manual techniques;Gait training;Stair training;Moist Heat;Electrical Stimulation;Cryotherapy;Taping;Dry needling;Energy conservation;Passive range of motion;Joint Manipulations    PT Next Visit Plan Progress glute therex, pelvic-hip dissociation; check and progress HEP with stretches    PT Home Exercise Plan lateral step ups, step ups, hip kickers  ext/abd red theraband    Consulted and Agree with Plan of Care Patient  Patient will benefit from skilled therapeutic intervention in order to improve the following deficits and impairments:  Decreased range of motion, Postural dysfunction, Pain, Abnormal gait, Decreased activity tolerance, Decreased coordination, Decreased endurance, Decreased balance, Decreased mobility, Difficulty walking, Hypomobility, Increased muscle spasms, Impaired perceived functional ability, Decreased strength, Impaired flexibility  Visit Diagnosis: Muscle weakness (generalized)  Difficulty in walking, not elsewhere classified     Problem List Patient Active Problem List   Diagnosis Date Noted  . Osteopenia of neck of right femur 09/06/2020  . Class 1 obesity with serious comorbidity and body mass index (BMI) of 32.0 to 32.9 in adult 08/29/2020  . Candidal skin infection 04/29/2020  . Parkinson disease ([place]) 02/22/2020  . 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 Bipolar disorder, in full remission, most recent episode depressed ([place]) 11/08/2019  . Avitaminosis D 08/24/2019  . Insomnia due to mental condition 05/31/2019  . Bipolar I disorder, most recent episode depressed ([place]) 05/31/2019  . Mild cognitive disorder 05/31/2019  . Alcohol use disorder, moderate, in sustained remission ([place]) 05/31/2019  . Elevated tumor markers 05/27/2019  . GAD (generalized anxiety disorder) 04/10/2019  . MDD (major depressive disorder), severe ([place]) 01/31/2019  . High serum carbohydrate antigen 19-9 (CA19-9) 12/01/2018  . Adjustment disorder with anxious mood 11/24/2018  . Essential hypertension 11/24/2018  . B12 deficiency 10/17/2018  . Incisional hernia, without obstruction or gangrene 09/12/2018  . Genetic testing 04/07/2018  . Malignant neoplasm of upper-outer quadrant of right breast in female, estrogen receptor positive ([place]) 03/17/2018  . Malignant neoplasm of upper-inner quadrant of right breast in female,  estrogen receptor positive ([place]) 03/17/2018  . History of psychosis 02/07/2018  . History of insomnia 02/07/2018    Blythe Stanford, PT DPT 10/09/2020, 7:00 PM  [facility] [address], [place], [address] Phone: [phone]   Fax:  [phone]  Name: Kendra [name] MRN: [id number] Date of Birth: [birth year]

## 2020-10-16 ENCOUNTER — Ambulatory Visit: Payer: Medicare PPO

## 2020-10-16 ENCOUNTER — Other Ambulatory Visit: Payer: Self-pay

## 2020-10-16 DIAGNOSIS — R262 Difficulty in walking, not elsewhere classified: Secondary | ICD-10-CM

## 2020-10-16 DIAGNOSIS — M25551 Pain in right hip: Secondary | ICD-10-CM | POA: Diagnosis not present

## 2020-10-16 DIAGNOSIS — M25552 Pain in left hip: Secondary | ICD-10-CM | POA: Diagnosis not present

## 2020-10-16 DIAGNOSIS — M6281 Muscle weakness (generalized): Secondary | ICD-10-CM

## 2020-10-16 NOTE — Therapy (Signed)
[facility] [address] [place], [address] Phone: [phone]   Fax:  [phone]  Physical Therapy Treatment  Patient Details  Name: Kendra [name] MRN: [id number] Date of Birth: Dec 15, 1945 No data recorded  Encounter Date: 10/16/2020   PT End of Session - 10/16/20 1400    Visit Number 48    Number of Visits 74    Date for PT Re-Evaluation 10/28/20    PT Start Time 5462    PT Stop Time 1430    PT Time Calculation (min) 45 min    Equipment Utilized During Treatment Gait belt    Activity Tolerance Patient tolerated treatment well;No increased pain    Behavior During Therapy WFL for tasks assessed/performed           Past Medical History:  Diagnosis Date  . Anxiety   . Breast cancer ([facility]) 03/2018   right breast cancer at 11:00 and 1:00  . Bursitis    bilateral hips and knees  . Complication of anesthesia   . Depression   . Family history of breast cancer 03/24/2018  . Family history of lung cancer 03/24/2018  . History of alcohol dependence ([place]) 2007   no ETOH; resolved since 2007  . History of hiatal hernia   . History of psychosis 2014   due to sleep disturbance  . Hypertension   . Parkinson disease ([facility])   . Personal history of radiation therapy 06/2018-07/2018   right breast ca  . PONV (postoperative nausea and vomiting)     Past Surgical History:  Procedure Laterality Date  . BREAST BIOPSY Right 03/14/2018   11:00 DCIS and invasive ductal carcinoma  . BREAST BIOPSY Right 03/14/2018   1:00 Invasive ductal carcinoma  . BREAST LUMPECTOMY Right 04/27/2018   lumpectomy of 11 and 1:00 cancers, clear margins, negative LN  . BREAST LUMPECTOMY WITH RADIOACTIVE SEED AND SENTINEL LYMPH NODE BIOPSY Right 04/27/2018   Procedure: RIGHT BREAST LUMPECTOMY WITH RADIOACTIVE SEED X 2 AND RIGHT SENTINEL LYMPH NODE BIOPSY;  Surgeon: [name], MD;  Location: [facility];  Service:  General;  Laterality: Right;  . CATARACT EXTRACTION W/PHACO Left 08/30/2019   Procedure: CATARACT EXTRACTION PHACO AND INTRAOCULAR LENS PLACEMENT ([place]) LEFT panoptix toric  01:20.5  12.7%  10.26;  Surgeon: Leandrew Koyanagi, MD;  Location: [facility];  Service: Ophthalmology;  Laterality: Left;  . CATARACT EXTRACTION W/PHACO Right 09/20/2019   Procedure: CATARACT EXTRACTION PHACO AND INTRAOCULAR LENS PLACEMENT ([facility]) RIGHT 5.71  00:36.4  15.7%;  Surgeon: Leandrew Koyanagi, MD;  Location: [facility];  Service: Ophthalmology;  Laterality: Right;  . LAPAROTOMY N/A 02/10/2018   Procedure: EXPLORATORY LAPAROTOMY;  Surgeon: Isabel Caprice, MD;  [facility];  Service: Gynecology;  Laterality: N/A;  . MASS EXCISION  01/2018   abdominal  . OVARIAN CYST REMOVAL    . SALPINGOOPHORECTOMY Bilateral 02/10/2018   Procedure: BILATERAL SALPINGO OOPHORECTOMY; PERITONEAL WASHINGS;  Surgeon: Isabel Caprice, MD;  [facility];  Service: Gynecology;  Laterality: Bilateral;  . TONSILLECTOMY    . TONSILLECTOMY AND ADENOIDECTOMY  1953    There were no vitals filed for this visit.   Subjective Assessment - 10/16/20 1358    Subjective Patient states she been having a few days where we doesn't have inrceased dizziness. Then symptoms return.    Pertinent History Patient report unclear regarding onset. Visited MD on Sept 16 and received injection with relief for approximately 1 month. Reports  she cannot receive an additional injection pending eye surgery. Received dx from MD of hip bursitis in B hips. Reports pain with walking, stanindg, sitting 5/10 current 7/10 worst 3/10 best, with temporary relief when she is sitting "just right" and with naproxen. Agg: activity. Cormorbidities: hx of breast cancer, HTN, MCI, insomnia, cataracts    Limitations Sitting;Standing;Walking;House hold activities    How long can you sit comfortably? Pain constant, reduced with neutral pelvic tilt    How long  can you stand comfortably? Pain constant, increases with time    How long can you walk comfortably? Pain constant, increases with time    Patient Stated Goals Be able to move without hurting, walk 1 hour    Currently in Pain? No/denies    Pain Onset More than a month ago             TREATMENT Neuromuscular re-ed Sitting reaching up, out, down and back - x 15 Backwards walking with finding cones from therapists passing contralaterally - 4 x 27ft Backward amb with body rotations - 4 x 31ft Backwards walking with ball toss - 4 x 22  Wall rolls - x 5 each direction Backwards amb with busy backwards - 2 x 16ft    Performed exercises to improve balance limitations       PT Education - 10/16/20 1359    Education provided Yes    Education Details form/technique with exercise    Person(s) Educated Patient    Methods Explanation;Demonstration    Comprehension Returned demonstration;Verbalized understanding            PT Short Term Goals - 09/12/19 1419      PT SHORT TERM GOAL #1   Title Patient will demonstrate complaince with HEP to speed recovery and reduce total number of visits.    Baseline HEP given    Time 2    Period Weeks    Status New    Target Date 09/27/19             PT Long Term Goals - 09/03/20 1057      PT LONG TERM GOAL #1   Title Patient will improve hip strength to 4/5 grossly to facilitate improved mobility with ADLs.    Baseline 3, 4-; 12/22 deferred to NV; 11/09/2019: 4-/5; 12/07/2019 L hip flexor 4, abd and ext 3+; 01/29/2020: 4/5 for most hip motions, 3+/5 for abd, ext; 03/20/2020: 4/5 for hip movements    Time 8    Period Weeks    Status Achieved      PT LONG TERM GOAL #2   Title Patient will demonstrate walking tolerance increased to 30 minutes without increase in symptoms to demonstrate improved ability to perform walking program for cardiovascular health.    Baseline 10 minute tolerance (patient report); 12/22 10 minutes (per pt report);  11/09/2019: 7 min; 12/07/2019 unable to assess due to icy streets;  01/29/2020: 10 min; 03/20/2020 10 min; 05/14/2020: 10 min x 3 per day; 06/11/2020: 31min x 3 per day; 07/15/2020: 17min x 3 per day; 09/02/2020: 27min x 3 (just standing rest breaks in between)    Time 8    Period Weeks    Status On-going      PT LONG TERM GOAL #3   Title Patient will report worst pain of 3/10 during therapy to demonstrate reduced disability    Baseline Worst 7/10; 12/22 worst 9/10; 11/09/2019: 9/10; 12/07/2019 7/10; 01/29/2020: 7/10; 05/14/2020: 0/ 10    Time 8  Period Weeks    Status Achieved      PT LONG TERM GOAL #4   Title Patient will demonstrate ability to perform all bed mobility maneuvers without increase in pain for reduced disability with mobility and transfers with ADLs.    Baseline Reports pain with bed mobility supine <> sidelying L/R, sidelying to sitting EOB; 12/22 pt able to perform I without increased pain    Time 8    Period Weeks    Status Achieved      PT LONG TERM GOAL #5   Title Patient will improve 10MW speed to 1.0 m/sec to meet norms for minimal fall risk and full community ambulation.    Baseline 0.88 m/sec; 12/22 0.96 m/sec; 11/09/2019: deferred; 12/07/2019 0.90 m/sec; 01/29/2020: .9  m/sec; 03/20/2020: .63m/s; 05/14/2020: . 9 m/s; 06/11/2020: .80m/s; 07/15/2020: 1 m/s    Time 8    Period Weeks    Status Achieved      PT LONG TERM GOAL #6   Title Patient will increase 6MWT by 200 ft to demonstrate improvement in functional capacity for transition to cardiac walking program    Baseline 6MWT = 1195; 1/7/20201: deferred; 12/07/2019 1095; 01/29/2020: deferred secondary to fatigue; 03/20/2020: 1039ft; 07/15/2020: 1159ft; 09/03/2020: 850ft    Time 8    Period Weeks    Status On-going      PT LONG TERM GOAL #7   Title Patient will improve her 5xsts to under 15 sec to indicate singificnat improvement in LE functional strength and decrease in fall risk.    Baseline 24.5 sec; 03/20/2020: 19.19 sec; 06/13/2020:  14. 92    Time 6    Period Weeks    Status Achieved      PT LONG TERM GOAL #8   Title Patient will improve FGA to 28/30 to indicate improvement in dynamic balance and improvement in stability.    Baseline 23/30; 06/11/2020: 25/30; 07/15/2020: 26/30; 09/02/2020: 27/30    Time 6    Period Weeks    Status On-going                 Plan - 10/16/20 1421    Clinical Impression Statement Increased LOB with busy backgrounds today and patient fatigues quickly with exercises performed. Patient does well overall and never has any LOB which requires direct therapist intervention to maintain balance. Patient is improving as she begins to have days without dizziness. Patient will benefit from further skilled therapy to return to prior level of function.    Personal Factors and Comorbidities Age;Comorbidity 3+;Time since onset of injury/illness/exacerbation;Comorbidity 2    Comorbidities MCI, HTN, CA, cataracts    Examination-Activity Limitations Bend;Sit;Squat;Stairs;Stand;Sleep    Examination-Participation Restrictions Cleaning;Community Activity    Stability/Clinical Decision Making Evolving/Moderate complexity    Rehab Potential Fair    Clinical Impairments Affecting Rehab Potential MCI    PT Frequency 2x / week    PT Duration 6 weeks    PT Treatment/Interventions ADLs/Self Care Home Management;Therapeutic exercise;Patient/family education;Neuromuscular re-education;Therapeutic activities;Functional mobility training;Balance training;Manual techniques;Gait training;Stair training;Moist Heat;Electrical Stimulation;Cryotherapy;Taping;Dry needling;Energy conservation;Passive range of motion;Joint Manipulations    PT Next Visit Plan Progress glute therex, pelvic-hip dissociation; check and progress HEP with stretches    PT Home Exercise Plan lateral step ups, step ups, hip kickers ext/abd red theraband    Consulted and Agree with Plan of Care Patient           Patient will benefit from skilled  therapeutic intervention in order to improve the following deficits and  impairments:  Decreased range of motion,Postural dysfunction,Pain,Abnormal gait,Decreased activity tolerance,Decreased coordination,Decreased endurance,Decreased balance,Decreased mobility,Difficulty walking,Hypomobility,Increased muscle spasms,Impaired perceived functional ability,Decreased strength,Impaired flexibility  Visit Diagnosis: Muscle weakness (generalized)  Difficulty in walking, not elsewhere classified     Problem List Patient Active Problem List   Diagnosis Date Noted  . Osteopenia of neck of right femur 09/06/2020  . Class 1 obesity with serious comorbidity and body mass index (BMI) of 32.0 to 32.9 in adult 08/29/2020  . Candidal skin infection 04/29/2020  . Parkinson disease ([place]) 02/22/2020  . 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 Bipolar disorder, in full remission, most recent episode depressed ([place]) 11/08/2019  . Avitaminosis D 08/24/2019  . Insomnia due to mental condition 05/31/2019  . Bipolar I disorder, most recent episode depressed ([place]) 05/31/2019  . Mild cognitive disorder 05/31/2019  . Alcohol use disorder, moderate, in sustained remission ([place]) 05/31/2019  . Elevated tumor markers 05/27/2019  . GAD (generalized anxiety disorder) 04/10/2019  . MDD (major depressive disorder), severe ([place]) 01/31/2019  . High serum carbohydrate antigen 19-9 (CA19-9) 12/01/2018  . Adjustment disorder with anxious mood 11/24/2018  . Essential hypertension 11/24/2018  . B12 deficiency 10/17/2018  . Incisional hernia, without obstruction or gangrene 09/12/2018  . Genetic testing 04/07/2018  . Malignant neoplasm of upper-outer quadrant of right breast in female, estrogen receptor positive ([place]) 03/17/2018  . Malignant neoplasm of upper-inner quadrant of right breast in female, estrogen receptor positive ([place]) 03/17/2018  . History of psychosis 02/07/2018  . History of insomnia 02/07/2018    Blythe Stanford 10/16/2020, 2:54 PM  [facility] [address] [place], [address] Phone: [phone]   Fax:  [phone]  Name: Kendra [name] MRN: [id number] Date of Birth: 12-12-1945

## 2020-10-22 ENCOUNTER — Ambulatory Visit: Payer: Medicare PPO

## 2020-10-22 ENCOUNTER — Other Ambulatory Visit: Payer: Self-pay

## 2020-10-22 DIAGNOSIS — M6281 Muscle weakness (generalized): Secondary | ICD-10-CM

## 2020-10-22 DIAGNOSIS — M25552 Pain in left hip: Secondary | ICD-10-CM | POA: Diagnosis not present

## 2020-10-22 DIAGNOSIS — R262 Difficulty in walking, not elsewhere classified: Secondary | ICD-10-CM | POA: Diagnosis not present

## 2020-10-22 DIAGNOSIS — M25551 Pain in right hip: Secondary | ICD-10-CM | POA: Diagnosis not present

## 2020-10-22 NOTE — Therapy (Signed)
[facility] [address] [place], [address] Phone: [phone]   Fax:  [phone]  Physical Therapy Treatment  Patient Details  Name: Kendra [name] MRN: [id number] Date of Birth: 06-21-1946 No data recorded  Encounter Date: 10/22/2020   PT End of Session - 10/22/20 1013    Visit Number 72    Number of Visits 74    Date for PT Re-Evaluation 10/28/20    PT Start Time 0945    PT Stop Time 1030    PT Time Calculation (min) 45 min    Equipment Utilized During Treatment Gait belt    Activity Tolerance Patient tolerated treatment well;No increased pain    Behavior During Therapy WFL for tasks assessed/performed           Past Medical History:  Diagnosis Date  . Anxiety   . Breast cancer ([place]) 03/2018   right breast cancer at 11:00 and 1:00  . Bursitis    bilateral hips and knees  . Complication of anesthesia   . Depression   . Family history of breast cancer 03/24/2018  . Family history of lung cancer 03/24/2018  . History of alcohol dependence ([place]) 2007   no ETOH; resolved since 2007  . History of hiatal hernia   . History of psychosis 2014   due to sleep disturbance  . Hypertension   . Parkinson disease ([place])   . Personal history of radiation therapy 06/2018-07/2018   right breast ca  . PONV (postoperative nausea and vomiting)     Past Surgical History:  Procedure Laterality Date  . BREAST BIOPSY Right 03/14/2018   11:00 DCIS and invasive ductal carcinoma  . BREAST BIOPSY Right 03/14/2018   1:00 Invasive ductal carcinoma  . BREAST LUMPECTOMY Right 04/27/2018   lumpectomy of 11 and 1:00 cancers, clear margins, negative LN  . BREAST LUMPECTOMY WITH RADIOACTIVE SEED AND SENTINEL LYMPH NODE BIOPSY Right 04/27/2018   Procedure: RIGHT BREAST LUMPECTOMY WITH RADIOACTIVE SEED X 2 AND RIGHT SENTINEL LYMPH NODE BIOPSY;  Surgeon: [name], MD;  Location: [facility];  Service:  General;  Laterality: Right;  . CATARACT EXTRACTION W/PHACO Left 08/30/2019   Procedure: CATARACT EXTRACTION PHACO AND INTRAOCULAR LENS PLACEMENT ([place]) LEFT panoptix toric  01:20.5  12.7%  10.26;  Surgeon: Leandrew Koyanagi, MD;  Location: [facility];  Service: Ophthalmology;  Laterality: Left;  . CATARACT EXTRACTION W/PHACO Right 09/20/2019   Procedure: CATARACT EXTRACTION PHACO AND INTRAOCULAR LENS PLACEMENT ([facility]) RIGHT 5.71  00:36.4  15.7%;  Surgeon: Leandrew Koyanagi, MD;  Location: [facility];  Service: Ophthalmology;  Laterality: Right;  . LAPAROTOMY N/A 02/10/2018   Procedure: EXPLORATORY LAPAROTOMY;  Surgeon: Isabel Caprice, MD;  Location: [facility];  Service: Gynecology;  Laterality: N/A;  . MASS EXCISION  01/2018   abdominal  . OVARIAN CYST REMOVAL    . SALPINGOOPHORECTOMY Bilateral 02/10/2018   Procedure: BILATERAL SALPINGO OOPHORECTOMY; PERITONEAL WASHINGS;  Surgeon: Isabel Caprice, MD;  Location: [facility];  Service: Gynecology;  Laterality: Bilateral;  . TONSILLECTOMY    . TONSILLECTOMY AND ADENOIDECTOMY  1953    There were no vitals filed for this visit.   Subjective Assessment - 10/22/20 1004    Subjective Patient states she has been having less brain fog where we was able to contribute to a conversation about finances which she has not been able to do recently.    Pertinent History Patient report unclear regarding onset. Visited MD on  Sept 16 and received injection with relief for approximately 1 month. Reports she cannot receive an additional injection pending eye surgery. Received dx from MD of hip bursitis in B hips. Reports pain with walking, stanindg, sitting 5/10 current 7/10 worst 3/10 best, with temporary relief when she is sitting "just right" and with naproxen. Agg: activity. Cormorbidities: hx of breast cancer, HTN, MCI, insomnia, cataracts    Limitations Sitting;Standing;Walking;House hold activities    How long can you sit comfortably? Pain  constant, reduced with neutral pelvic tilt    How long can you stand comfortably? Pain constant, increases with time    How long can you walk comfortably? Pain constant, increases with time    Patient Stated Goals Be able to move without hurting, walk 1 hour    Currently in Pain? No/denies    Pain Onset More than a month ago              TREATMENT Neuromuscular re-ed Sitting reaching up, out, down and back - x 15 Backwards walking with finding cones from therapists passing contralaterally - 4 x 61ft Backwards walking up/down - 4 x 25ft  Forward backwards walking EC changing directions -- 2 x 58ft Wall rolls - x 7 each direction Cone pick ups off of floor while standing on airex --  3 x 7 Backwards ambulation with busy background - 22ft x 2   Performed exercises to improve balance limitations    PT Education - 10/22/20 1012    Education provided Yes    Education Details form/technque with eercise    Person(s) Educated Patient    Methods Explanation;Demonstration    Comprehension Returned demonstration;Verbalized understanding            PT Short Term Goals - 09/12/19 1419      PT SHORT TERM GOAL #1   Title Patient will demonstrate complaince with HEP to speed recovery and reduce total number of visits.    Baseline HEP given    Time 2    Period Weeks    Status New    Target Date 09/27/19             PT Long Term Goals - 09/03/20 1057      PT LONG TERM GOAL #1   Title Patient will improve hip strength to 4/5 grossly to facilitate improved mobility with ADLs.    Baseline 3, 4-; 12/22 deferred to NV; 11/09/2019: 4-/5; 12/07/2019 L hip flexor 4, abd and ext 3+; 01/29/2020: 4/5 for most hip motions, 3+/5 for abd, ext; 03/20/2020: 4/5 for hip movements    Time 8    Period Weeks    Status Achieved      PT LONG TERM GOAL #2   Title Patient will demonstrate walking tolerance increased to 30 minutes without increase in symptoms to demonstrate improved ability to perform  walking program for cardiovascular health.    Baseline 10 minute tolerance (patient report); 12/22 10 minutes (per pt report); 11/09/2019: 7 min; 12/07/2019 unable to assess due to icy streets;  01/29/2020: 10 min; 03/20/2020 10 min; 05/14/2020: 10 min x 3 per day; 06/11/2020: 65min x 3 per day; 07/15/2020: 28min x 3 per day; 09/02/2020: 78min x 3 (just standing rest breaks in between)    Time 8    Period Weeks    Status On-going      PT LONG TERM GOAL #3   Title Patient will report worst pain of 3/10 during therapy to demonstrate reduced disability  Baseline Worst 7/10; 12/22 worst 9/10; 11/09/2019: 9/10; 12/07/2019 7/10; 01/29/2020: 7/10; 05/14/2020: 0/ 10    Time 8    Period Weeks    Status Achieved      PT LONG TERM GOAL #4   Title Patient will demonstrate ability to perform all bed mobility maneuvers without increase in pain for reduced disability with mobility and transfers with ADLs.    Baseline Reports pain with bed mobility supine <> sidelying L/R, sidelying to sitting EOB; 12/22 pt able to perform I without increased pain    Time 8    Period Weeks    Status Achieved      PT LONG TERM GOAL #5   Title Patient will improve 10MW speed to 1.0 m/sec to meet norms for minimal fall risk and full community ambulation.    Baseline 0.88 m/sec; 12/22 0.96 m/sec; 11/09/2019: deferred; 12/07/2019 0.90 m/sec; 01/29/2020: .9  m/sec; 03/20/2020: .58m/s; 05/14/2020: . 9 m/s; 06/11/2020: .26m/s; 07/15/2020: 1 m/s    Time 8    Period Weeks    Status Achieved      PT LONG TERM GOAL #6   Title Patient will increase 6MWT by 200 ft to demonstrate improvement in functional capacity for transition to cardiac walking program    Baseline 6MWT = 1195; 1/7/20201: deferred; 12/07/2019 1095; 01/29/2020: deferred secondary to fatigue; 03/20/2020: 1051ft; 07/15/2020: 1162ft; 09/03/2020: 811ft    Time 8    Period Weeks    Status On-going      PT LONG TERM GOAL #7   Title Patient will improve her 5xsts to under 15 sec to indicate  singificnat improvement in LE functional strength and decrease in fall risk.    Baseline 24.5 sec; 03/20/2020: 19.19 sec; 06/13/2020: 14. 92    Time 6    Period Weeks    Status Achieved      PT LONG TERM GOAL #8   Title Patient will improve FGA to 28/30 to indicate improvement in dynamic balance and improvement in stability.    Baseline 23/30; 06/11/2020: 25/30; 07/15/2020: 26/30; 09/02/2020: 27/30    Time 6    Period Weeks    Status On-going                 Plan - 10/22/20 1018    Clinical Impression Statement Performed exercises to address balance limitations and dizziness with walking. Patient progressing well with ability to withstand more difficult exercises, however continues to require frequent sitting rest breaks to manage symptoms. Patient will benefit from further skilled therapy focused on improving balance to address fall risk and help subside dizziness.    Personal Factors and Comorbidities Age;Comorbidity 3+;Time since onset of injury/illness/exacerbation;Comorbidity 2    Comorbidities MCI, HTN, CA, cataracts    Examination-Activity Limitations Bend;Sit;Squat;Stairs;Stand;Sleep    Examination-Participation Restrictions Cleaning;Community Activity    Stability/Clinical Decision Making Evolving/Moderate complexity    Rehab Potential Fair    Clinical Impairments Affecting Rehab Potential MCI    PT Frequency 2x / week    PT Duration 6 weeks    PT Treatment/Interventions ADLs/Self Care Home Management;Therapeutic exercise;Patient/family education;Neuromuscular re-education;Therapeutic activities;Functional mobility training;Balance training;Manual techniques;Gait training;Stair training;Moist Heat;Electrical Stimulation;Cryotherapy;Taping;Dry needling;Energy conservation;Passive range of motion;Joint Manipulations    PT Next Visit Plan Progress glute therex, pelvic-hip dissociation; check and progress HEP with stretches    PT Home Exercise Plan lateral step ups, step ups, hip  kickers ext/abd red theraband    Consulted and Agree with Plan of Care Patient  Patient will benefit from skilled therapeutic intervention in order to improve the following deficits and impairments:  Decreased range of motion,Postural dysfunction,Pain,Abnormal gait,Decreased activity tolerance,Decreased coordination,Decreased endurance,Decreased balance,Decreased mobility,Difficulty walking,Hypomobility,Increased muscle spasms,Impaired perceived functional ability,Decreased strength,Impaired flexibility  Visit Diagnosis: Muscle weakness (generalized)  Difficulty in walking, not elsewhere classified     Problem List Patient Active Problem List   Diagnosis Date Noted  . Osteopenia of neck of right femur 09/06/2020  . Class 1 obesity with serious comorbidity and body mass index (BMI) of 32.0 to 32.9 in adult 08/29/2020  . Candidal skin infection 04/29/2020  . Parkinson disease ([place]) 02/22/2020  . 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 Bipolar disorder, in full remission, most recent episode depressed ([place]) 11/08/2019  . Avitaminosis D 08/24/2019  . Insomnia due to mental condition 05/31/2019  . Bipolar I disorder, most recent episode depressed ([place]) 05/31/2019  . Mild cognitive disorder 05/31/2019  . Alcohol use disorder, moderate, in sustained remission ([place]) 05/31/2019  . Elevated tumor markers 05/27/2019  . GAD (generalized anxiety disorder) 04/10/2019  . MDD (major depressive disorder), severe ([place]) 01/31/2019  . High serum carbohydrate antigen 19-9 (CA19-9) 12/01/2018  . Adjustment disorder with anxious mood 11/24/2018  . Essential hypertension 11/24/2018  . B12 deficiency 10/17/2018  . Incisional hernia, without obstruction or gangrene 09/12/2018  . Genetic testing 04/07/2018  . Malignant neoplasm of upper-outer quadrant of right breast in female, estrogen receptor positive ([place]) 03/17/2018  . Malignant neoplasm of upper-inner quadrant of right breast in female,  estrogen receptor positive ([place]) 03/17/2018  . History of psychosis 02/07/2018  . History of insomnia 02/07/2018    Blythe Stanford, PT DPT 10/22/2020, 10:26 AM  [facility] [address], [place], [address] Phone: [phone]   Fax:  [phone]  Name: Elva Mauro MRN: [id number] Date of Birth: Apr 07, 1946

## 2020-10-28 ENCOUNTER — Other Ambulatory Visit: Payer: Self-pay

## 2020-10-28 ENCOUNTER — Ambulatory Visit: Payer: Medicare PPO

## 2020-10-28 DIAGNOSIS — M25552 Pain in left hip: Secondary | ICD-10-CM | POA: Diagnosis not present

## 2020-10-28 DIAGNOSIS — M6281 Muscle weakness (generalized): Secondary | ICD-10-CM

## 2020-10-28 DIAGNOSIS — M25551 Pain in right hip: Secondary | ICD-10-CM | POA: Diagnosis not present

## 2020-10-28 DIAGNOSIS — R262 Difficulty in walking, not elsewhere classified: Secondary | ICD-10-CM

## 2020-10-28 NOTE — Therapy (Signed)
[place] [facility] [address], [place], [address] Phone: [phone]   Fax:  [phone]  Physical Therapy Treatment and Re-certification   Patient Details  Name: Kendra [name] MRN: [id number] Date of Birth: 04-01-1946 No data recorded  Encounter Date: 10/28/2020   PT End of Session - 10/28/20 0949    Visit Number 73    Number of Visits 74    Date for PT Re-Evaluation 10/28/20    PT Start Time 0950    PT Stop Time 1030    PT Time Calculation (min) 40 min    Equipment Utilized During Treatment Gait belt    Activity Tolerance Patient tolerated treatment well;No increased pain    Behavior During Therapy [facility] for tasks assessed/performed           Past Medical History:  Diagnosis Date   Anxiety    Breast cancer (HCC) 03/2018   right breast cancer at 11:00 and 1:00   Bursitis    bilateral hips and knees   Complication of anesthesia    Depression    Family history of breast cancer 03/24/2018   Family history of lung cancer 03/24/2018   History of alcohol dependence (HCC) 2007   no ETOH; resolved since 2007   History of hiatal hernia    History of psychosis 2014   due to sleep disturbance   Hypertension    Parkinson disease (HCC)    Personal history of radiation therapy 06/2018-07/2018   right breast ca   PONV (postoperative nausea and vomiting)     Past Surgical History:  Procedure Laterality Date   BREAST BIOPSY Right 03/14/2018   11:00 DCIS and invasive ductal carcinoma   BREAST BIOPSY Right 03/14/2018   1:00 Invasive ductal carcinoma   BREAST LUMPECTOMY Right 04/27/2018   lumpectomy of 11 and 1:00 cancers, clear margins, negative LN   BREAST LUMPECTOMY WITH RADIOACTIVE SEED AND SENTINEL LYMPH NODE BIOPSY Right 04/27/2018   Procedure: RIGHT BREAST LUMPECTOMY WITH RADIOACTIVE SEED X 2 AND RIGHT SENTINEL LYMPH NODE BIOPSY;  Surgeon: Glenna Fellows, MD;  Location: [facility];  Service: General;  Laterality: Right;   CATARACT EXTRACTION W/PHACO Left 08/30/2019   Procedure: CATARACT EXTRACTION PHACO AND INTRAOCULAR LENS PLACEMENT (IOC) LEFT panoptix toric  01:20.5  12.7%  10.26;  Surgeon: Lockie Mola, MD;  Location: [facility];  Service: Ophthalmology;  Laterality: Left;   CATARACT EXTRACTION W/PHACO Right 09/20/2019   Procedure: CATARACT EXTRACTION PHACO AND INTRAOCULAR LENS PLACEMENT (IOC) RIGHT 5.71  00:36.4  15.7%;  Surgeon: Lockie Mola, MD;  Location: [facility];  Service: Ophthalmology;  Laterality: Right;   LAPAROTOMY N/A 02/10/2018   Procedure: EXPLORATORY LAPAROTOMY;  Surgeon: Shonna Chock, MD;  Location: [facility];  Service: Gynecology;  Laterality: N/A;   MASS EXCISION  01/2018   abdominal   OVARIAN CYST REMOVAL     SALPINGOOPHORECTOMY Bilateral 02/10/2018   Procedure: BILATERAL SALPINGO OOPHORECTOMY; PERITONEAL WASHINGS;  Surgeon: Shonna Chock, MD;  Location: [facility];  Service: Gynecology;  Laterality: Bilateral;   TONSILLECTOMY     TONSILLECTOMY AND ADENOIDECTOMY  1953    There were no vitals filed for this visit.   Subjective Assessment - 10/28/20 0952    Subjective Patient reported that she is feeling whoozy, stated it is more of a bad day today, and that it was like that yesterday as well.    Pertinent History Patient report unclear regarding onset. Visited MD on Sept  16 and received injection with relief for approximately 1 month. Reports she cannot receive an additional injection pending eye surgery. Received dx from MD of hip bursitis in B hips. Reports pain with walking, stanindg, sitting 5/10 current 7/10 worst 3/10 best, with temporary relief when she is sitting "just right" and with naproxen. Agg: activity. Cormorbidities: hx of breast cancer, HTN, MCI, insomnia, cataracts    Limitations Sitting;Standing;Walking;House hold activities    How long can you sit comfortably? Pain constant,  reduced with neutral pelvic tilt    How long can you stand comfortably? Pain constant, increases with time    How long can you walk comfortably? Pain constant, increases with time    Patient Stated Goals Be able to move without hurting, walk 1 hour    Currently in Pain? No/denies           TREATMENT: goals to be assessed next visit    Neuromuscular re-ed  Sitting reaching up, out, down and back - x 15  Backwards eyes open x2 rounds Backwards walking with eyes closed x2 rounds  Cone pick ups from normal floor in varying positions on floor with PT choice of which color 4 square directed activity x 5 minutes Standing on foam feet together 2 rounds of vertical and horizontal head turns 2x10. Second set with only 2 finger single UE support, first set full single UE support Forward backwards walking EC changing directions -- 2 x 60ft   Cone pick ups off of floor while standing on airex --  2 rounds of 4 cone pick up, first round with LUE support, second set without UE support Backwards ambulation with busy background - 74ft x 2  Performed exercises to improve balance limitations    Pt response/clincal impression: Pt needed CGA throughout session today due to pt reported feelings of increased unsteadiness. Most challenged by narrow base of support as well as head turns. Intermittent sitting rest breaks needed as well due to pt complaints of dizziness. The patient would benefit from further skilled PT intervention to continue to progress towards goals. Goals deferred to next session due to pt reported worsening of symptoms.        PT Education - 10/28/20 0949    Education provided Yes    Education Details form/technique    Person(s) Educated Patient    Methods Explanation;Demonstration    Comprehension Verbalized understanding;Verbal cues required;Returned demonstration            PT Short Term Goals - 09/12/19 1419      PT SHORT TERM GOAL #1   Title Patient will demonstrate  complaince with HEP to speed recovery and reduce total number of visits.    Baseline HEP given    Time 2    Period Weeks    Status New    Target Date 09/27/19             PT Long Term Goals - 09/03/20 1057      PT LONG TERM GOAL #1   Title Patient will improve hip strength to 4/5 grossly to facilitate improved mobility with ADLs.    Baseline 3, 4-; 12/22 deferred to NV; 11/09/2019: 4-/5; 12/07/2019 L hip flexor 4, abd and ext 3+; 01/29/2020: 4/5 for most hip motions, 3+/5 for abd, ext; 03/20/2020: 4/5 for hip movements    Time 8    Period Weeks    Status Achieved      PT LONG TERM GOAL #2   Title Patient will demonstrate walking  tolerance increased to 30 minutes without increase in symptoms to demonstrate improved ability to perform walking program for cardiovascular health.    Baseline 10 minute tolerance (patient report); 12/22 10 minutes (per pt report); 11/09/2019: 7 min; 12/07/2019 unable to assess due to icy streets;  01/29/2020: 10 min; 03/20/2020 10 min; 05/14/2020: 10 min x 3 per day; 06/11/2020: x 3 per day; 07/15/2020: x 3 per day; 09/02/2020: x 3 (just standing rest breaks in between)    Time 8    Period Weeks    Status On-going      PT LONG TERM GOAL #3   Title Patient will report worst pain of 3/10 during therapy to demonstrate reduced disability    Baseline Worst 7/10; 12/22 worst 9/10; 11/09/2019: 9/10; 12/07/2019 7/10; 01/29/2020: 7/10; 05/14/2020: 0/ 10    Time 8    Period Weeks    Status Achieved      PT LONG TERM GOAL #4   Title Patient will demonstrate ability to perform all bed mobility maneuvers without increase in pain for reduced disability with mobility and transfers with ADLs.    Baseline Reports pain with bed mobility supine <> sidelying L/R, sidelying to sitting EOB; 12/22 pt able to perform I without increased pain    Time 8    Period Weeks    Status Achieved      PT LONG TERM GOAL #5   Title Patient will improve speed to 1.0 m/sec to meet  norms for minimal fall risk and full community ambulation.    Baseline 0.88 m/sec; 12/22 0.96 m/sec; 11/09/2019: deferred; 12/07/2019 0.90 m/sec; 01/29/2020: .9  m/sec; 03/20/2020: .44m/s; 05/14/2020: . 9 m/s; 06/11/2020: .47m/s; 07/15/2020: 1 m/s    Time 8    Period Weeks    Status Achieved      PT LONG TERM GOAL #6   Title Patient will increase by 200 ft to demonstrate improvement in functional capacity for transition to cardiac walking program    Baseline = 1195; 1/7/20201: deferred; 12/07/2019 1095; 01/29/2020: deferred secondary to fatigue; 03/20/2020: 1050ft; 07/15/2020: 1114ft; 09/03/2020: 877ft    Time 8    Period Weeks    Status On-going      PT LONG TERM GOAL #7   Title Patient will improve her 5xsts to under 15 sec to indicate singificnat improvement in LE functional strength and decrease in fall risk.    Baseline 24.5 sec; 03/20/2020: 19.19 sec; 06/13/2020: 14. 92    Time 6    Period Weeks    Status Achieved      PT LONG TERM GOAL #8   Title Patient will improve FGA to 28/30 to indicate improvement in dynamic balance and improvement in stability.    Baseline 23/30; 06/11/2020: 25/30; 07/15/2020: 26/30; 09/02/2020: 27/30    Time 6    Period Weeks    Status On-going                 Plan - 10/28/20 0949    Clinical Impression Statement Pt needed CGA throughout session today due to pt reported feelings of increased unsteadiness. Most challenged by narrow base of support as well as head turns. Intermittent sitting rest breaks needed as well due to pt complaints of dizziness. The patient would benefit from further skilled PT intervention to continue to progress towards goals. Goals deferred to next session due to pt reported worsening of symptoms.    Personal Factors and Comorbidities Age;Comorbidity 3+;Time since onset of  injury/illness/exacerbation;Comorbidity 2    Comorbidities MCI, HTN, CA, cataracts    Examination-Activity Limitations Bend;Sit;Squat;Stairs;Stand;Sleep     Examination-Participation Restrictions Cleaning;Community Activity    Stability/Clinical Decision Making Evolving/Moderate complexity    Rehab Potential Fair    Clinical Impairments Affecting Rehab Potential MCI    PT Frequency 2x / week    PT Duration 6 weeks    PT Treatment/Interventions ADLs/Self Care Home Management;Therapeutic exercise;Patient/family education;Neuromuscular re-education;Therapeutic activities;Functional mobility training;Balance training;Manual techniques;Gait training;Stair training;Moist Heat;Electrical Stimulation;Cryotherapy;Taping;Dry needling;Energy conservation;Passive range of motion;Joint Manipulations    PT Next Visit Plan Progress glute therex, pelvic-hip dissociation; check and progress HEP with stretches    PT Home Exercise Plan lateral step ups, step ups, hip kickers ext/abd red theraband    Consulted and Agree with Plan of Care Patient           Patient will benefit from skilled therapeutic intervention in order to improve the following deficits and impairments:  Decreased range of motion,Postural dysfunction,Pain,Abnormal gait,Decreased activity tolerance,Decreased coordination,Decreased endurance,Decreased balance,Decreased mobility,Difficulty walking,Hypomobility,Increased muscle spasms,Impaired perceived functional ability,Decreased strength,Impaired flexibility  Visit Diagnosis: Muscle weakness (generalized)  Difficulty in walking, not elsewhere classified  Pain in left hip  Pain in right hip     Problem List Patient Active Problem List   Diagnosis Date Noted   Osteopenia of neck of right femur 09/06/2020   Class 1 obesity with serious comorbidity and body mass index (BMI) of 32.0 to 32.9 in adult 08/29/2020   Candidal skin infection 04/29/2020   Parkinson disease (HCC) 02/22/2020   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   Bipolar disorder, in full remission, most recent episode depressed (HCC) 11/08/2019   Avitaminosis D 08/24/2019    Insomnia due to mental condition 05/31/2019   Bipolar I disorder, most recent episode depressed (HCC) 05/31/2019   Mild cognitive disorder 05/31/2019   Alcohol use disorder, moderate, in sustained remission (HCC) 05/31/2019   Elevated tumor markers 05/27/2019   GAD (generalized anxiety disorder) 04/10/2019   MDD (major depressive disorder), severe (HCC) 01/31/2019   High serum carbohydrate antigen 19-9 (CA19-9) 12/01/2018   Adjustment disorder with anxious mood 11/24/2018   Essential hypertension 11/24/2018   B12 deficiency 10/17/2018   Incisional hernia, without obstruction or gangrene 09/12/2018   Genetic testing 04/07/2018   Malignant neoplasm of upper-outer quadrant of right breast in female, estrogen receptor positive (HCC) 03/17/2018   Malignant neoplasm of upper-inner quadrant of right breast in female, estrogen receptor positive (HCC) 03/17/2018   History of psychosis 02/07/2018   History of insomnia 02/07/2018    Olga Coaster PT, DPT 11:37 AM,10/28/20   [place] [facility] [address] [place], [address] Phone: [phone]   Fax:  [phone]  Name: Kendra [name] MRN: [id number] Date of Birth: 12/21/1945

## 2020-10-30 ENCOUNTER — Ambulatory Visit: Payer: Medicare PPO

## 2020-10-30 ENCOUNTER — Other Ambulatory Visit: Payer: Self-pay | Admitting: Hematology and Oncology

## 2020-10-30 DIAGNOSIS — B372 Candidiasis of skin and nail: Secondary | ICD-10-CM

## 2020-11-05 ENCOUNTER — Other Ambulatory Visit: Payer: Self-pay

## 2020-11-05 ENCOUNTER — Ambulatory Visit: Payer: Medicare PPO | Attending: Family Medicine

## 2020-11-05 DIAGNOSIS — M6281 Muscle weakness (generalized): Secondary | ICD-10-CM | POA: Diagnosis not present

## 2020-11-05 DIAGNOSIS — R262 Difficulty in walking, not elsewhere classified: Secondary | ICD-10-CM | POA: Insufficient documentation

## 2020-11-05 DIAGNOSIS — M25552 Pain in left hip: Secondary | ICD-10-CM | POA: Diagnosis not present

## 2020-11-06 ENCOUNTER — Ambulatory Visit (INDEPENDENT_AMBULATORY_CARE_PROVIDER_SITE_OTHER): Payer: Medicare PPO | Admitting: Licensed Clinical Social Worker

## 2020-11-06 ENCOUNTER — Encounter: Payer: Self-pay | Admitting: Family Medicine

## 2020-11-06 DIAGNOSIS — F3176 Bipolar disorder, in full remission, most recent episode depressed: Secondary | ICD-10-CM | POA: Diagnosis not present

## 2020-11-06 DIAGNOSIS — F411 Generalized anxiety disorder: Secondary | ICD-10-CM

## 2020-11-06 DIAGNOSIS — Z1211 Encounter for screening for malignant neoplasm of colon: Secondary | ICD-10-CM | POA: Diagnosis not present

## 2020-11-06 NOTE — Therapy (Signed)
[facility] [address] [place], [address] Phone: [phone]   Fax:  [phone]  Physical Therapy Treatment  Patient Details  Name: Kendra [name] MRN: [id number] Date of Birth: 20-Apr-1946 No data recorded  Encounter Date: 11/05/2020   PT End of Session - 11/06/20 0931    Visit Number 74    Number of Visits 84    Date for PT Re-Evaluation 01/01/21    PT Start Time 1300    PT Stop Time 1345    PT Time Calculation (min) 45 min    Equipment Utilized During Treatment Gait belt    Activity Tolerance Patient tolerated treatment well;No increased pain    Behavior During Therapy WFL for tasks assessed/performed           Past Medical History:  Diagnosis Date  . Anxiety   . Breast cancer ([place]) 03/2018   right breast cancer at 11:00 and 1:00  . Bursitis    bilateral hips and knees  . Complication of anesthesia   . Depression   . Family history of breast cancer 03/24/2018  . Family history of lung cancer 03/24/2018  . History of alcohol dependence ([place]) 2007   no ETOH; resolved since 2007  . History of hiatal hernia   . History of psychosis 2014   due to sleep disturbance  . Hypertension   . Parkinson disease ([place])   . Personal history of radiation therapy 06/2018-07/2018   right breast ca  . PONV (postoperative nausea and vomiting)     Past Surgical History:  Procedure Laterality Date  . BREAST BIOPSY Right 03/14/2018   11:00 DCIS and invasive ductal carcinoma  . BREAST BIOPSY Right 03/14/2018   1:00 Invasive ductal carcinoma  . BREAST LUMPECTOMY Right 04/27/2018   lumpectomy of 11 and 1:00 cancers, clear margins, negative LN  . BREAST LUMPECTOMY WITH RADIOACTIVE SEED AND SENTINEL LYMPH NODE BIOPSY Right 04/27/2018   Procedure: RIGHT BREAST LUMPECTOMY WITH RADIOACTIVE SEED X 2 AND RIGHT SENTINEL LYMPH NODE BIOPSY;  Surgeon: [name], MD;  Location: [facility];  Service:  General;  Laterality: Right;  . CATARACT EXTRACTION W/PHACO Left 08/30/2019   Procedure: CATARACT EXTRACTION PHACO AND INTRAOCULAR LENS PLACEMENT ([facility]) LEFT panoptix toric  01:20.5  12.7%  10.26;  Surgeon: Leandrew Koyanagi, MD;  Location: [facility];  Service: Ophthalmology;  Laterality: Left;  . CATARACT EXTRACTION W/PHACO Right 09/20/2019   Procedure: CATARACT EXTRACTION PHACO AND INTRAOCULAR LENS PLACEMENT ([facility]) RIGHT 5.71  00:36.4  15.7%;  Surgeon: Leandrew Koyanagi, MD;  Location: [facility];  Service: Ophthalmology;  Laterality: Right;  . LAPAROTOMY N/A 02/10/2018   Procedure: EXPLORATORY LAPAROTOMY;  Surgeon: Isabel Caprice, MD;  Location: [facility];  Service: Gynecology;  Laterality: N/A;  . MASS EXCISION  01/2018   abdominal  . OVARIAN CYST REMOVAL    . SALPINGOOPHORECTOMY Bilateral 02/10/2018   Procedure: BILATERAL SALPINGO OOPHORECTOMY; PERITONEAL WASHINGS;  Surgeon: Isabel Caprice, MD;  Location: [facility];  Service: Gynecology;  Laterality: Bilateral;  . TONSILLECTOMY    . TONSILLECTOMY AND ADENOIDECTOMY  1953    There were no vitals filed for this visit.   Subjective Assessment - 11/05/20 1307    Subjective Patient repots she had two days of feeling "normal" and states has been feeling 'good' today.    Pertinent History Patient report unclear regarding onset. Visited MD on Sept 16 and received injection with relief for approximately 1 month. Reports she  cannot receive an additional injection pending eye surgery. Received dx from MD of hip bursitis in B hips. Reports pain with walking, stanindg, sitting 5/10 current 7/10 worst 3/10 best, with temporary relief when she is sitting "just right" and with naproxen. Agg: activity. Cormorbidities: hx of breast cancer, HTN, MCI, insomnia, cataracts    Limitations Sitting;Standing;Walking;House hold activities    How long can you sit comfortably? Pain constant, reduced with neutral pelvic tilt    How long can you stand  comfortably? Pain constant, increases with time    How long can you walk comfortably? Pain constant, increases with time    Patient Stated Goals Be able to move without hurting, walk 1 hour    Currently in Pain? No/denies                TREATMENT Therapeutic Exercise Ambulation with focus on improving walking speed. EC walking - 21f x 2 EO walking backwards - 343fx 2 Stairs with UE support - 4 stairs  Tandem walking in standing - 6 x 1074fn standing Wall spinning along wall for vestibular challenge - x 20  Walking backwards with rotating and finding ball/hand off - 2 x 40f51frformed exercises to improve dizziness symptoms      PT Education - 11/05/20 1310    Education provided Yes    Education Details form/technique with exercise    Person(s) Educated Patient    Methods Explanation;Demonstration    Comprehension Returned demonstration;Verbalized understanding            PT Short Term Goals - 09/12/19 1419      PT SHORT TERM GOAL #1   Title Patient will demonstrate complaince with HEP to speed recovery and reduce total number of visits.    Baseline HEP given    Time 2    Period Weeks    Status New    Target Date 09/27/19             PT Long Term Goals - 11/05/20 1318      PT LONG TERM GOAL #1   Title Patient will improve hip strength to 4/5 grossly to facilitate improved mobility with ADLs.    Baseline 3, 4-; 12/22 deferred to NV; 11/09/2019: 4-/5; 12/07/2019 L hip flexor 4, abd and ext 3+; 01/29/2020: 4/5 for most hip motions, 3+/5 for abd, ext; 03/20/2020: 4/5 for hip movements    Time 8    Period Weeks    Status Achieved      PT LONG TERM GOAL #2   Title Patient will demonstrate walking tolerance increased to 30 minutes without increase in symptoms to demonstrate improved ability to perform walking program for cardiovascular health.    Baseline 10 minute tolerance (patient report); 12/22 10 minutes (per pt report); 11/09/2019: 7 min; 12/07/2019 unable to  assess due to icy streets;  01/29/2020: 10 min; 03/20/2020 10 min; 05/14/2020: 10 min x 3 per day; 06/11/2020: 10mi35m3 per day; 07/15/2020: 10min65m per day; 09/02/2020: 10min 25m(just standing rest breaks in between); 11/06/2019: 10 min x 3    Time 8    Period Weeks    Status Partially Met      PT LONG TERM GOAL #3   Title Patient will report worst pain of 3/10 during therapy to demonstrate reduced disability    Baseline Worst 7/10; 12/22 worst 9/10; 11/09/2019: 9/10; 12/07/2019 7/10; 01/29/2020: 7/10; 05/14/2020: 0/ 10    Time 8    Period Weeks  Status Achieved      PT LONG TERM GOAL #4   Title Patient will demonstrate ability to perform all bed mobility maneuvers without increase in pain for reduced disability with mobility and transfers with ADLs.    Baseline Reports pain with bed mobility supine <> sidelying L/R, sidelying to sitting EOB; 12/22 pt able to perform I without increased pain    Time 8    Period Weeks    Status Achieved      PT LONG TERM GOAL #5   Title Patient will improve 10MW speed to 1.0 m/sec to meet norms for minimal fall risk and full community ambulation.    Baseline 0.88 m/sec; 12/22 0.96 m/sec; 11/09/2019: deferred; 12/07/2019 0.90 m/sec; 01/29/2020: .9  m/sec; 03/20/2020: .1ms; 05/14/2020: . 9 m/s; 06/11/2020: .921m; 07/15/2020: 1 m/s    Time 8    Period Weeks    Status Achieved      Additional Long Term Goals   Additional Long Term Goals Yes      PT LONG TERM GOAL #6   Title Patient will increase 6MWT by 200 ft to demonstrate improvement in functional capacity for transition to cardiac walking program    Baseline 6MWT = 1195; 1/7/20201: deferred; 12/07/2019 1095; 01/29/2020: deferred secondary to fatigue; 03/20/2020: 107083f9/13/2021: 1130f27f1/12/2019: 800ft28f4/2022: 973ft 62fime 8    Period Weeks    Status On-going      PT LONG TERM GOAL #7   Title Patient will improve her 5xsts to under 15 sec to indicate singificnat improvement in LE functional strength and  decrease in fall risk.    Baseline 24.5 sec; 03/20/2020: 19.19 sec; 06/13/2020: 14. 92    Time 6    Period Weeks    Status Achieved      PT LONG TERM GOAL #8   Title Patient will improve FGA to 28/30 to indicate improvement in dynamic balance and improvement in stability.    Baseline 23/30; 06/11/2020: 25/30; 07/15/2020: 26/30; 09/02/2020: 27/30; 11/05/2020: 28/30    Time 6    Period Weeks    Status Achieved      PT LONG TERM GOAL  #9   TITLE Patient will be able to ascend/desend the stairs without use of UEs to increase safety and decrease fall risk with stair use.    Baseline requires use of UEs    Time 6    Period Weeks    Status New    Target Date 12/31/20                 Plan - 11/06/20 0932    Clinical Impression Statement Patient with overall improvement of symptoms as she had a significant improvement with performing FGA and subjectively having less overall symptoms compared to previous sessions. Patient with improvement in ability to perform stronger vestibular exercises as well. Although patient is improving, she continues to have difficulty with dizziness for walking for longer periods of time as well as has days of increased dizziness. Patient's dizziness is improving overall, but continues to have signficant symptoms. Patient will benefit from further skilled therapy to return to prior level of function.    Personal Factors and Comorbidities Age;Comorbidity 3+;Time since onset of injury/illness/exacerbation;Comorbidity 2    Comorbidities MCI, HTN, CA, cataracts    Examination-Activity Limitations Bend;Sit;Squat;Stairs;Stand;Sleep    Examination-Participation Restrictions Cleaning;Community Activity    Stability/Clinical Decision Making Evolving/Moderate complexity    Rehab Potential Fair    Clinical Impairments Affecting Rehab Potential MCI  PT Frequency 2x / week    PT Duration 6 weeks    PT Treatment/Interventions ADLs/Self Care Home Management;Therapeutic  exercise;Patient/family education;Neuromuscular re-education;Therapeutic activities;Functional mobility training;Balance training;Manual techniques;Gait training;Stair training;Moist Heat;Electrical Stimulation;Cryotherapy;Taping;Dry needling;Energy conservation;Passive range of motion;Joint Manipulations    PT Next Visit Plan Progress glute therex, pelvic-hip dissociation; check and progress HEP with stretches    PT Home Exercise Plan lateral step ups, step ups, hip kickers ext/abd red theraband    Consulted and Agree with Plan of Care Patient           Patient will benefit from skilled therapeutic intervention in order to improve the following deficits and impairments:  Decreased range of motion,Postural dysfunction,Pain,Abnormal gait,Decreased activity tolerance,Decreased coordination,Decreased endurance,Decreased balance,Decreased mobility,Difficulty walking,Hypomobility,Increased muscle spasms,Impaired perceived functional ability,Decreased strength,Impaired flexibility  Visit Diagnosis: Muscle weakness (generalized)  Difficulty in walking, not elsewhere classified     Problem List Patient Active Problem List   Diagnosis Date Noted  . Osteopenia of neck of right femur 09/06/2020  . Class 1 obesity with serious comorbidity and body mass index (BMI) of 32.0 to 32.9 in adult 08/29/2020  . Candidal skin infection 04/29/2020  . Parkinson disease ([facility]) 02/22/2020  . 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 Bipolar disorder, in full remission, most recent episode depressed ([place]) 11/08/2019  . Avitaminosis D 08/24/2019  . Insomnia due to mental condition 05/31/2019  . Bipolar I disorder, most recent episode depressed ([facility]) 05/31/2019  . Mild cognitive disorder 05/31/2019  . Alcohol use disorder, moderate, in sustained remission ([place] [place]) 05/31/2019  . Elevated tumor markers 05/27/2019  . GAD (generalized anxiety disorder) 04/10/2019  . MDD (major depressive disorder), severe ([facility]) 01/31/2019   . High serum carbohydrate antigen 19-9 (CA19-9) 12/01/2018  . Adjustment disorder with anxious mood 11/24/2018  . Essential hypertension 11/24/2018  . B12 deficiency 10/17/2018  . Incisional hernia, without obstruction or gangrene 09/12/2018  . Genetic testing 04/07/2018  . Malignant neoplasm of upper-outer quadrant of right breast in female, estrogen receptor positive ([facility]) 03/17/2018  . Malignant neoplasm of upper-inner quadrant of right breast in female, estrogen receptor positive ([place]) 03/17/2018  . History of psychosis 02/07/2018  . History of insomnia 02/07/2018    Blythe Stanford, PT DPT 11/06/2020, 9:35 AM  [facility] [address], [place], [address] Phone: [phone]   Fax:  [phone]  Name: Cordell Coke MRN: [id number] Date of Birth: [birth year]

## 2020-11-06 NOTE — Progress Notes (Signed)
Virtual Visit via Audio Note  I connected with Kendra [name] on 11/06/20 at  1:00 PM EST by an audio enabled telemedicine application and verified that I am speaking with the correct person using two identifiers.  Location: Patient: home Provider: remote office [place], [place])   I discussed the limitations of evaluation and management by telemedicine and the availability of in person appointments. The patient expressed understanding and agreed to proceed.  The patient was advised to call back or seek an in-person evaluation if the symptoms worsen or if the condition fails to improve as anticipated.  I provided 60 minutes of non-face-to-face time during this encounter.   Kendra [name] R Charleton Deyoung, LCSW    THERAPIST PROGRESS NOTE  Session Time: 1-2p  Participation Level: Active  Behavioral Response: NAAlertAnxious and Depressed  Type of Therapy: Individual Therapy  Treatment Goals addressed: Coping  Interventions: CBT and Supportive  Summary: Kendra [name] is a 75 y.o. female who presents with symptoms consistent with bipolar disorder. Pt reports that overall mood has been stable--pt reports fair quality and quantity of sleep. Pt taking 1/2 trazodone for first half of night and if she wakes up, will take the other 1/2. Pt wants to just take the whole pill before bedtime--wants to discuss w/ Dr. Elna Breslow.   Pt got new hearing aids and although they are difficult to place inside ears--pt feels they are improving overall quality of life. Pt is continuing to get used to having them automatically synced with telephone.   Allowed pt to share thoughts and feelings about holiday break and time spent w/ husband. Pt reports that the art classes have stopped until around March--pt states that she may do some drop-in classes at the [facility], but it won't be the same "I enjoy the instruction, and they won't have that at the senior center".   Discussed safety and pt has not had any falls  recently. Pt still walking unassisted when she gets up in the middle of the night to go to bathroom. "I hold onto the side of the bed".  Pt is still going to PT for vestibular therapy to help manage dizziness/physical limitations.   Pt reports that she often gets frustrated when she loses herself in a conversation. Pt did lose track of conversation several times throughout counseling session. Pt also forgot words a few times and would ask LCSW counselor for clarification--clinician always wants pt to try to find word for herself unless she asks for assistance. Discussed common games that are cognitively stimulating, and encouraged pt to continue engaging in those activities. Pt frequently plays dominoes with husband--great game option for her.   Pt is happy that she is able to take care of herself--encouraged pt to continue focusing on self care, life balance, physical activity (PT), positive social engagement.   Suicidal/Homicidal: No  Therapist Response: Kendra [name] is demonstrating a growing capacity for greater pleasure in social engagements, leisure time and creative projects, and overall life management. Pt has the ability to identify anxiety/stress triggers and utilize helpful coping mechanisms. Pt is working hard to maintain current levels of progress.   Plan: Return again in 3 weeks.  Diagnosis: Axis I: Bipolar, Depressed and Generalized Anxiety Disorder    Axis II: No diagnosis    Kendra Haber Hamid Brookens, LCSW 11/06/2020

## 2020-11-07 ENCOUNTER — Other Ambulatory Visit: Payer: Self-pay

## 2020-11-07 ENCOUNTER — Ambulatory Visit: Payer: Medicare PPO

## 2020-11-07 DIAGNOSIS — M25552 Pain in left hip: Secondary | ICD-10-CM | POA: Diagnosis not present

## 2020-11-07 DIAGNOSIS — R262 Difficulty in walking, not elsewhere classified: Secondary | ICD-10-CM | POA: Diagnosis not present

## 2020-11-07 DIAGNOSIS — M6281 Muscle weakness (generalized): Secondary | ICD-10-CM | POA: Diagnosis not present

## 2020-11-07 NOTE — Therapy (Signed)
[facility] [address], [place], [address] Phone: [phone]   Fax:  [phone]  Physical Therapy Treatment  Patient Details  Name: Kendra [name] MRN: [id number] Date of Birth: [birth year] No data recorded  Encounter Date: 11/07/2020   PT End of Session - 11/07/20 1313    Visit Number 75    Number of Visits 84    Date for PT Re-Evaluation 01/01/21    PT Start Time 1300    PT Stop Time 1345    PT Time Calculation (min) 45 min    Equipment Utilized During Treatment Gait belt    Activity Tolerance Patient tolerated treatment well;No increased pain    Behavior During Therapy WFL for tasks assessed/performed           Past Medical History:  Diagnosis Date  . Anxiety   . Breast cancer (HCC) 03/2018   right breast cancer at 11:00 and 1:00  . Bursitis    bilateral hips and knees  . Complication of anesthesia   . Depression   . Family history of breast cancer 03/24/2018  . Family history of lung cancer 03/24/2018  . History of alcohol dependence (HCC) 2007   no ETOH; resolved since 2007  . History of hiatal hernia   . History of psychosis 2014   due to sleep disturbance  . Hypertension   . Parkinson disease (HCC)   . Personal history of radiation therapy 06/2018-07/2018   right breast ca  . PONV (postoperative nausea and vomiting)     Past Surgical History:  Procedure Laterality Date  . BREAST BIOPSY Right 03/14/2018   11:00 DCIS and invasive ductal carcinoma  . BREAST BIOPSY Right 03/14/2018   1:00 Invasive ductal carcinoma  . BREAST LUMPECTOMY Right 04/27/2018   lumpectomy of 11 and 1:00 cancers, clear margins, negative LN  . BREAST LUMPECTOMY WITH RADIOACTIVE SEED AND SENTINEL LYMPH NODE BIOPSY Right 04/27/2018   Procedure: RIGHT BREAST LUMPECTOMY WITH RADIOACTIVE SEED X 2 AND RIGHT SENTINEL LYMPH NODE BIOPSY;  Surgeon: Glenna Fellows, MD;  Location: [facility];  Service:  General;  Laterality: Right;  . CATARACT EXTRACTION W/PHACO Left 08/30/2019   Procedure: CATARACT EXTRACTION PHACO AND INTRAOCULAR LENS PLACEMENT (IOC) LEFT panoptix toric  01:20.5  12.7%  10.26;  Surgeon: Lockie Mola, MD;  Location: [facility];  Service: Ophthalmology;  Laterality: Left;  . CATARACT EXTRACTION W/PHACO Right 09/20/2019   Procedure: CATARACT EXTRACTION PHACO AND INTRAOCULAR LENS PLACEMENT (IOC) RIGHT 5.71  00:36.4  15.7%;  Surgeon: Lockie Mola, MD;  Location: [facility];  Service: Ophthalmology;  Laterality: Right;  . LAPAROTOMY N/A 02/10/2018   Procedure: EXPLORATORY LAPAROTOMY;  Surgeon: Shonna Chock, MD;  Location: [facility];  Service: Gynecology;  Laterality: N/A;  . MASS EXCISION  01/2018   abdominal  . OVARIAN CYST REMOVAL    . SALPINGOOPHORECTOMY Bilateral 02/10/2018   Procedure: BILATERAL SALPINGO OOPHORECTOMY; PERITONEAL WASHINGS;  Surgeon: Shonna Chock, MD;  Location: [facility];  Service: Gynecology;  Laterality: Bilateral;  . TONSILLECTOMY    . TONSILLECTOMY AND ADENOIDECTOMY  1953    There were no vitals filed for this visit.   Subjective Assessment - 11/07/20 1307    Subjective Patient reports she has been feeling tired this morning from going to the grocery store. Patient states the wooziness has been better day.    Pertinent History Patient report unclear regarding onset. Visited MD on Sept 16 and received injection with  relief for approximately 1 month. Reports she cannot receive an additional injection pending eye surgery. Received dx from MD of hip bursitis in B hips. Reports pain with walking, stanindg, sitting 5/10 current 7/10 worst 3/10 best, with temporary relief when she is sitting "just right" and with naproxen. Agg: activity. Cormorbidities: hx of breast cancer, HTN, MCI, insomnia, cataracts    Limitations Sitting;Standing;Walking;House hold activities    How long can you sit comfortably? Pain constant, reduced with  neutral pelvic tilt    How long can you stand comfortably? Pain constant, increases with time    How long can you walk comfortably? Pain constant, increases with time    Patient Stated Goals Be able to move without hurting, walk 1 hour    Currently in Pain? No/denies                TREATMENT Therapeutic Exercise Ambulation with focus on improving walking speed with head turns following ball with eyes performing rotations - x 181ft  Tandem walking in standing - 5 x 51ft in standing forward and backward Seated reach outs, and down, out and back - x 15 Walking backwards with rotating and finding ball/hand off - 2 x 54ft Wall spinning along wall for vestibular challenge - x 8 Backwards walking with ball throwing up and calling out numbers -- 2 x 52ft  Performed exercises to improve dizziness symptoms      PT Education - 11/07/20 1313    Education provided Yes    Education Details form/technique with exercise    Person(s) Educated Patient    Methods Explanation;Demonstration    Comprehension Verbalized understanding;Returned demonstration            PT Short Term Goals - 09/12/19 1419      PT SHORT TERM GOAL #1   Title Patient will demonstrate complaince with HEP to speed recovery and reduce total number of visits.    Baseline HEP given    Time 2    Period Weeks    Status New    Target Date 09/27/19             PT Long Term Goals - 11/05/20 1318      PT LONG TERM GOAL #1   Title Patient will improve hip strength to 4/5 grossly to facilitate improved mobility with ADLs.    Baseline 3, 4-; 12/22 deferred to NV; 11/09/2019: 4-/5; 12/07/2019 L hip flexor 4, abd and ext 3+; 01/29/2020: 4/5 for most hip motions, 3+/5 for abd, ext; 03/20/2020: 4/5 for hip movements    Time 8    Period Weeks    Status Achieved      PT LONG TERM GOAL #2   Title Patient will demonstrate walking tolerance increased to 30 minutes without increase in symptoms to demonstrate improved ability to  perform walking program for cardiovascular health.    Baseline 10 minute tolerance (patient report); 12/22 10 minutes (per pt report); 11/09/2019: 7 min; 12/07/2019 unable to assess due to icy streets;  01/29/2020: 10 min; 03/20/2020 10 min; 05/14/2020: 10 min x 3 per day; 06/11/2020: 68min x 3 per day; 07/15/2020: 14min x 3 per day; 09/02/2020: 23min x 3 (just standing rest breaks in between); 11/06/2019: 10 min x 3    Time 8    Period Weeks    Status Partially Met      PT LONG TERM GOAL #3   Title Patient will report worst pain of 3/10 during therapy to demonstrate reduced disability  Baseline Worst 7/10; 12/22 worst 9/10; 11/09/2019: 9/10; 12/07/2019 7/10; 01/29/2020: 7/10; 05/14/2020: 0/ 10    Time 8    Period Weeks    Status Achieved      PT LONG TERM GOAL #4   Title Patient will demonstrate ability to perform all bed mobility maneuvers without increase in pain for reduced disability with mobility and transfers with ADLs.    Baseline Reports pain with bed mobility supine <> sidelying L/R, sidelying to sitting EOB; 12/22 pt able to perform I without increased pain    Time 8    Period Weeks    Status Achieved      PT LONG TERM GOAL #5   Title Patient will improve 10MW speed to 1.0 m/sec to meet norms for minimal fall risk and full community ambulation.    Baseline 0.88 m/sec; 12/22 0.96 m/sec; 11/09/2019: deferred; 12/07/2019 0.90 m/sec; 01/29/2020: .9  m/sec; 03/20/2020: .37m/s; 05/14/2020: . 9 m/s; 06/11/2020: .8m/s; 07/15/2020: 1 m/s    Time 8    Period Weeks    Status Achieved      Additional Long Term Goals   Additional Long Term Goals Yes      PT LONG TERM GOAL #6   Title Patient will increase 6MWT by 200 ft to demonstrate improvement in functional capacity for transition to cardiac walking program    Baseline 6MWT = 1195; 1/7/20201: deferred; 12/07/2019 1095; 01/29/2020: deferred secondary to fatigue; 03/20/2020: 1062ft; 07/15/2020: 1127ft; 09/03/2020: 848ft; 11/05/2020: 915ft    Time 8    Period Weeks     Status On-going      PT LONG TERM GOAL #7   Title Patient will improve her 5xsts to under 15 sec to indicate singificnat improvement in LE functional strength and decrease in fall risk.    Baseline 24.5 sec; 03/20/2020: 19.19 sec; 06/13/2020: 14. 92    Time 6    Period Weeks    Status Achieved      PT LONG TERM GOAL #8   Title Patient will improve FGA to 28/30 to indicate improvement in dynamic balance and improvement in stability.    Baseline 23/30; 06/11/2020: 25/30; 07/15/2020: 26/30; 09/02/2020: 27/30; 11/05/2020: 28/30    Time 6    Period Weeks    Status Achieved      PT LONG TERM GOAL  #9   TITLE Patient will be able to ascend/desend the stairs without use of UEs to increase safety and decrease fall risk with stair use.    Baseline requires use of UEs    Time 6    Period Weeks    Status New    Target Date 12/31/20                 Plan - 11/07/20 1331    Clinical Impression Statement Patient demonstrates improvement with tandem amb, specifically backwards amb. Although patient is improving, she continues to have disruption in balance with rotational movements. Patient will benefit from further skilled therapy to return to prior level of function.    Personal Factors and Comorbidities Age;Comorbidity 3+;Time since onset of injury/illness/exacerbation;Comorbidity 2    Comorbidities MCI, HTN, CA, cataracts    Examination-Activity Limitations Bend;Sit;Squat;Stairs;Stand;Sleep    Examination-Participation Restrictions Cleaning;Community Activity    Stability/Clinical Decision Making Evolving/Moderate complexity    Rehab Potential Fair    Clinical Impairments Affecting Rehab Potential MCI    PT Frequency 2x / week    PT Duration 6 weeks    PT Treatment/Interventions ADLs/Self Care Home Management;Therapeutic  exercise;Patient/family education;Neuromuscular re-education;Therapeutic activities;Functional mobility training;Balance training;Manual techniques;Gait training;Stair  training;Moist Heat;Electrical Stimulation;Cryotherapy;Taping;Dry needling;Energy conservation;Passive range of motion;Joint Manipulations    PT Next Visit Plan Progress glute therex, pelvic-hip dissociation; check and progress HEP with stretches    PT Home Exercise Plan lateral step ups, step ups, hip kickers ext/abd red theraband    Consulted and Agree with Plan of Care Patient           Patient will benefit from skilled therapeutic intervention in order to improve the following deficits and impairments:  Decreased range of motion,Postural dysfunction,Pain,Abnormal gait,Decreased activity tolerance,Decreased coordination,Decreased endurance,Decreased balance,Decreased mobility,Difficulty walking,Hypomobility,Increased muscle spasms,Impaired perceived functional ability,Decreased strength,Impaired flexibility  Visit Diagnosis: Muscle weakness (generalized)  Difficulty in walking, not elsewhere classified  Pain in left hip     Problem List Patient Active Problem List   Diagnosis Date Noted  . Osteopenia of neck of right femur 09/06/2020  . Class 1 obesity with serious comorbidity and body mass index (BMI) of 32.0 to 32.9 in adult 08/29/2020  . Candidal skin infection 04/29/2020  . Parkinson disease ([place]) 02/22/2020  . 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 Bipolar disorder, in full remission, most recent episode depressed ([place]) 11/08/2019  . Avitaminosis D 08/24/2019  . Insomnia due to mental condition 05/31/2019  . Bipolar I disorder, most recent episode depressed ([place]) 05/31/2019  . Mild cognitive disorder 05/31/2019  . Alcohol use disorder, moderate, in sustained remission ([place]) 05/31/2019  . Elevated tumor markers 05/27/2019  . GAD (generalized anxiety disorder) 04/10/2019  . MDD (major depressive disorder), severe ([place]) 01/31/2019  . High serum carbohydrate antigen 19-9 (CA19-9) 12/01/2018  . Adjustment disorder with anxious mood 11/24/2018  . Essential hypertension  11/24/2018  . B12 deficiency 10/17/2018  . Incisional hernia, without obstruction or gangrene 09/12/2018  . Genetic testing 04/07/2018  . Malignant neoplasm of upper-outer quadrant of right breast in female, estrogen receptor positive ([place]) 03/17/2018  . Malignant neoplasm of upper-inner quadrant of right breast in female, estrogen receptor positive ([place]) 03/17/2018  . History of psychosis 02/07/2018  . History of insomnia 02/07/2018    Blythe Stanford, PT DPT 11/07/2020, 1:37 PM  [facility] [address] [place], [address] Phone: [phone]   Fax:  [phone]  Name: Kendra [name] MRN: [id number] Date of Birth: 01-29-1946

## 2020-11-12 ENCOUNTER — Other Ambulatory Visit: Payer: Self-pay

## 2020-11-12 ENCOUNTER — Ambulatory Visit: Payer: Medicare PPO

## 2020-11-12 DIAGNOSIS — R262 Difficulty in walking, not elsewhere classified: Secondary | ICD-10-CM | POA: Diagnosis not present

## 2020-11-12 DIAGNOSIS — M6281 Muscle weakness (generalized): Secondary | ICD-10-CM | POA: Diagnosis not present

## 2020-11-12 DIAGNOSIS — M25552 Pain in left hip: Secondary | ICD-10-CM | POA: Diagnosis not present

## 2020-11-12 NOTE — Therapy (Signed)
[facility] [address], [place], [address] Phone: [phone]   Fax:  [phone]  Physical Therapy Treatment  Patient Details  Name: Kendra [name] MRN: [id number] Date of Birth: 1945/11/12 No data recorded  Encounter Date: 11/12/2020   PT End of Session - 11/12/20 1308    Visit Number 76    Number of Visits 84    Date for PT Re-Evaluation 01/01/21    PT Start Time 1300    PT Stop Time 1345    PT Time Calculation (min) 45 min    Equipment Utilized During Treatment Gait belt    Activity Tolerance Patient tolerated treatment well;No increased pain    Behavior During Therapy WFL for tasks assessed/performed           Past Medical History:  Diagnosis Date  . Anxiety   . Breast cancer ([place]) 03/2018   right breast cancer at 11:00 and 1:00  . Bursitis    bilateral hips and knees  . Complication of anesthesia   . Depression   . Family history of breast cancer 03/24/2018  . Family history of lung cancer 03/24/2018  . History of alcohol dependence ([place]) 2007   no ETOH; resolved since 2007  . History of hiatal hernia   . History of psychosis 2014   due to sleep disturbance  . Hypertension   . Parkinson disease ([place])   . Personal history of radiation therapy 06/2018-07/2018   right breast ca  . PONV (postoperative nausea and vomiting)     Past Surgical History:  Procedure Laterality Date  . BREAST BIOPSY Right 03/14/2018   11:00 DCIS and invasive ductal carcinoma  . BREAST BIOPSY Right 03/14/2018   1:00 Invasive ductal carcinoma  . BREAST LUMPECTOMY Right 04/27/2018   lumpectomy of 11 and 1:00 cancers, clear margins, negative LN  . BREAST LUMPECTOMY WITH RADIOACTIVE SEED AND SENTINEL LYMPH NODE BIOPSY Right 04/27/2018   Procedure: RIGHT BREAST LUMPECTOMY WITH RADIOACTIVE SEED X 2 AND RIGHT SENTINEL LYMPH NODE BIOPSY;  Surgeon: [name], MD;  Location: [facility];  Service:  General;  Laterality: Right;  . CATARACT EXTRACTION W/PHACO Left 08/30/2019   Procedure: CATARACT EXTRACTION PHACO AND INTRAOCULAR LENS PLACEMENT ([facility]) LEFT panoptix toric  01:20.5  12.7%  10.26;  Surgeon: Leandrew Koyanagi, MD;  Location: [facility];  Service: Ophthalmology;  Laterality: Left;  . CATARACT EXTRACTION W/PHACO Right 09/20/2019   Procedure: CATARACT EXTRACTION PHACO AND INTRAOCULAR LENS PLACEMENT ([facility]) RIGHT 5.71  00:36.4  15.7%;  Surgeon: Leandrew Koyanagi, MD;  Location: [facility];  Service: Ophthalmology;  Laterality: Right;  . LAPAROTOMY N/A 02/10/2018   Procedure: EXPLORATORY LAPAROTOMY;  Surgeon: Isabel Caprice, MD;  Location: [facility];  Service: Gynecology;  Laterality: N/A;  . MASS EXCISION  01/2018   abdominal  . OVARIAN CYST REMOVAL    . SALPINGOOPHORECTOMY Bilateral 02/10/2018   Procedure: BILATERAL SALPINGO OOPHORECTOMY; PERITONEAL WASHINGS;  Surgeon: Isabel Caprice, MD;  Location: [facility];  Service: Gynecology;  Laterality: Bilateral;  . TONSILLECTOMY    . TONSILLECTOMY AND ADENOIDECTOMY  1953    There were no vitals filed for this visit.   Subjective Assessment - 11/12/20 1306    Subjective Patient reports she's "feeling good" right now and feels like physical therapy has been helping.    Pertinent History Patient report unclear regarding onset. Visited MD on Sept 16 and received injection with relief for approximately 1 month. Reports she cannot  receive an additional injection pending eye surgery. Received dx from [facility] of hip bursitis in B hips. Reports pain with walking, stanindg, sitting 5/10 current 7/10 worst 3/10 best, with temporary relief when she is sitting "just right" and with naproxen. Agg: activity. Cormorbidities: hx of breast cancer, HTN, MCI, insomnia, cataracts    Limitations Sitting;Standing;Walking;House hold activities    How long can you sit comfortably? Pain constant, reduced with neutral pelvic tilt    How long can you  stand comfortably? Pain constant, increases with time    How long can you walk comfortably? Pain constant, increases with time    Patient Stated Goals Be able to move without hurting, walk 1 hour    Currently in Pain? No/denies                    TREATMENT Therapeutic Exercise Side stepping with ball toss forward reaching outside of COG - 4 x 54ft  Seated reach outs, and down, out and back - x 15 Backwards walking with ball toss outside of COG -- 4 x 21ft Walking backwards with rotating and finding ball/hand off - 2 x 9ft Backwards walking with busy backgrounds - 2 x 38ft Ambulation with focus on improving walking speed with head turns following ball with eyes performing rotations - x 228ft  Wall spinning along wall for vestibular challenge - x 10 Stairs up/down without UE support - x5 (4 steps)     Performed exercises to improve dizziness symptoms      PT Education - 11/12/20 1307    Education provided Yes    Education Details form/technique with exercise    Person(s) Educated Patient    Methods Explanation;Demonstration    Comprehension Verbalized understanding;Returned demonstration            PT Short Term Goals - 09/12/19 1419      PT SHORT TERM GOAL #1   Title Patient will demonstrate complaince with HEP to speed recovery and reduce total number of visits.    Baseline HEP given    Time 2    Period Weeks    Status New    Target Date 09/27/19             PT Long Term Goals - 11/05/20 1318      PT LONG TERM GOAL #1   Title Patient will improve hip strength to 4/5 grossly to facilitate improved mobility with ADLs.    Baseline 3, 4-; 12/22 deferred to NV; 11/09/2019: 4-/5; 12/07/2019 L hip flexor 4, abd and ext 3+; 01/29/2020: 4/5 for most hip motions, 3+/5 for abd, ext; 03/20/2020: 4/5 for hip movements    Time 8    Period Weeks    Status Achieved      PT LONG TERM GOAL #2   Title Patient will demonstrate walking tolerance increased to 30 minutes  without increase in symptoms to demonstrate improved ability to perform walking program for cardiovascular health.    Baseline 10 minute tolerance (patient report); 12/22 10 minutes (per pt report); 11/09/2019: 7 min; 12/07/2019 unable to assess due to icy streets;  01/29/2020: 10 min; 03/20/2020 10 min; 05/14/2020: 10 min x 3 per day; 06/11/2020: 50min x 3 per day; 07/15/2020: 77min x 3 per day; 09/02/2020: 65min x 3 (just standing rest breaks in between); 11/06/2019: 10 min x 3    Time 8    Period Weeks    Status Partially Met      PT LONG TERM GOAL #3  Title Patient will report worst pain of 3/10 during therapy to demonstrate reduced disability    Baseline Worst 7/10; 12/22 worst 9/10; 11/09/2019: 9/10; 12/07/2019 7/10; 01/29/2020: 7/10; 05/14/2020: 0/ 10    Time 8    Period Weeks    Status Achieved      PT LONG TERM GOAL #4   Title Patient will demonstrate ability to perform all bed mobility maneuvers without increase in pain for reduced disability with mobility and transfers with ADLs.    Baseline Reports pain with bed mobility supine <> sidelying L/R, sidelying to sitting EOB; 12/22 pt able to perform I without increased pain    Time 8    Period Weeks    Status Achieved      PT LONG TERM GOAL #5   Title Patient will improve 10MW speed to 1.0 m/sec to meet norms for minimal fall risk and full community ambulation.    Baseline 0.88 m/sec; 12/22 0.96 m/sec; 11/09/2019: deferred; 12/07/2019 0.90 m/sec; 01/29/2020: .9  m/sec; 03/20/2020: .58m/s; 05/14/2020: . 9 m/s; 06/11/2020: .28m/s; 07/15/2020: 1 m/s    Time 8    Period Weeks    Status Achieved      Additional Long Term Goals   Additional Long Term Goals Yes      PT LONG TERM GOAL #6   Title Patient will increase 6MWT by 200 ft to demonstrate improvement in functional capacity for transition to cardiac walking program    Baseline 6MWT = 1195; 1/7/20201: deferred; 12/07/2019 1095; 01/29/2020: deferred secondary to fatigue; 03/20/2020: 1097ft; 07/15/2020: 1139ft;  09/03/2020: 843ft; 11/05/2020: 988ft    Time 8    Period Weeks    Status On-going      PT LONG TERM GOAL #7   Title Patient will improve her 5xsts to under 15 sec to indicate singificnat improvement in LE functional strength and decrease in fall risk.    Baseline 24.5 sec; 03/20/2020: 19.19 sec; 06/13/2020: 14. 92    Time 6    Period Weeks    Status Achieved      PT LONG TERM GOAL #8   Title Patient will improve FGA to 28/30 to indicate improvement in dynamic balance and improvement in stability.    Baseline 23/30; 06/11/2020: 25/30; 07/15/2020: 26/30; 09/02/2020: 27/30; 11/05/2020: 28/30    Time 6    Period Weeks    Status Achieved      PT LONG TERM GOAL  #9   TITLE Patient will be able to ascend/desend the stairs without use of UEs to increase safety and decrease fall risk with stair use.    Baseline requires use of UEs    Time 6    Period Weeks    Status New    Target Date 12/31/20                 Plan - 11/12/20 1340    Clinical Impression Statement Patient able to tolerate greater amount of spinning with exercises performed indicating functional carryover between sessions. Although patient is improving, she continues to have difficulty with prolonged walk as the turning in the clinic disrupts patient's ability to maintain dynamic balance. Patient is improving overall and will benefit from further skilled therapy to return to prior level of function.    Personal Factors and Comorbidities Age;Comorbidity 3+;Time since onset of injury/illness/exacerbation;Comorbidity 2    Comorbidities MCI, HTN, CA, cataracts    Examination-Activity Limitations Bend;Sit;Squat;Stairs;Stand;Sleep    Examination-Participation Restrictions Cleaning;Community Activity    Stability/Clinical Decision Making Evolving/Moderate complexity  Rehab Potential Fair    Clinical Impairments Affecting Rehab Potential MCI    PT Frequency 2x / week    PT Duration 6 weeks    PT Treatment/Interventions ADLs/Self  Care Home Management;Therapeutic exercise;Patient/family education;Neuromuscular re-education;Therapeutic activities;Functional mobility training;Balance training;Manual techniques;Gait training;Stair training;Moist Heat;Electrical Stimulation;Cryotherapy;Taping;Dry needling;Energy conservation;Passive range of motion;Joint Manipulations    PT Next Visit Plan Progress glute therex, pelvic-hip dissociation; check and progress HEP with stretches    PT Home Exercise Plan lateral step ups, step ups, hip kickers ext/abd red theraband    Consulted and Agree with Plan of Care Patient           Patient will benefit from skilled therapeutic intervention in order to improve the following deficits and impairments:  Decreased range of motion,Postural dysfunction,Pain,Abnormal gait,Decreased activity tolerance,Decreased coordination,Decreased endurance,Decreased balance,Decreased mobility,Difficulty walking,Hypomobility,Increased muscle spasms,Impaired perceived functional ability,Decreased strength,Impaired flexibility  Visit Diagnosis: Muscle weakness (generalized)  Difficulty in walking, not elsewhere classified     Problem List Patient Active Problem List   Diagnosis Date Noted  . Osteopenia of neck of right femur 09/06/2020  . Class 1 obesity with serious comorbidity and body mass index (BMI) of 32.0 to 32.9 in adult 08/29/2020  . Candidal skin infection 04/29/2020  . Parkinson disease ([place]) 02/22/2020  . 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 Bipolar disorder, in full remission, most recent episode depressed ([place]) 11/08/2019  . Avitaminosis D 08/24/2019  . Insomnia due to mental condition 05/31/2019  . Bipolar I disorder, most recent episode depressed ([place]) 05/31/2019  . Mild cognitive disorder 05/31/2019  . Alcohol use disorder, moderate, in sustained remission ([place]) 05/31/2019  . Elevated tumor markers 05/27/2019  . GAD (generalized anxiety disorder) 04/10/2019  . MDD (major depressive  disorder), severe ([place]) 01/31/2019  . High serum carbohydrate antigen 19-9 (CA19-9) 12/01/2018  . Adjustment disorder with anxious mood 11/24/2018  . Essential hypertension 11/24/2018  . B12 deficiency 10/17/2018  . Incisional hernia, without obstruction or gangrene 09/12/2018  . Genetic testing 04/07/2018  . Malignant neoplasm of upper-outer quadrant of right breast in female, estrogen receptor positive ([place]) 03/17/2018  . Malignant neoplasm of upper-inner quadrant of right breast in female, estrogen receptor positive ([place]) 03/17/2018  . History of psychosis 02/07/2018  . History of insomnia 02/07/2018    Blythe Stanford, PT DPT 11/12/2020, 1:48 PM  [facility] [address], [place], [address] Phone: [phone]   Fax:  [phone]  Name: Kendra [name] MRN: [id number] Date of Birth: [birth year]

## 2020-11-14 ENCOUNTER — Other Ambulatory Visit: Payer: Self-pay

## 2020-11-14 ENCOUNTER — Ambulatory Visit: Payer: Medicare PPO

## 2020-11-14 DIAGNOSIS — M6281 Muscle weakness (generalized): Secondary | ICD-10-CM | POA: Diagnosis not present

## 2020-11-14 DIAGNOSIS — R262 Difficulty in walking, not elsewhere classified: Secondary | ICD-10-CM

## 2020-11-14 DIAGNOSIS — M25552 Pain in left hip: Secondary | ICD-10-CM | POA: Diagnosis not present

## 2020-11-14 NOTE — Therapy (Signed)
[facility] [address] [place], [address] Phone: [phone]   Fax:  [phone]  Physical Therapy Treatment  Patient Details  Name: Kendra [name] MRN: [id number] Date of Birth: 1946-10-17 No data recorded  Encounter Date: 11/14/2020   PT End of Session - 11/14/20 1337    Visit Number 77    Number of Visits 84    Date for PT Re-Evaluation 01/01/21    PT Start Time 1330    PT Stop Time 1415    PT Time Calculation (min) 45 min    Equipment Utilized During Treatment Gait belt    Activity Tolerance Patient tolerated treatment well;No increased pain    Behavior During Therapy WFL for tasks assessed/performed           Past Medical History:  Diagnosis Date  . Anxiety   . Breast cancer ([place]) 03/2018   right breast cancer at 11:00 and 1:00  . Bursitis    bilateral hips and knees  . Complication of anesthesia   . Depression   . Family history of breast cancer 03/24/2018  . Family history of lung cancer 03/24/2018  . History of alcohol dependence ([place]) 2007   no ETOH; resolved since 2007  . History of hiatal hernia   . History of psychosis 2014   due to sleep disturbance  . Hypertension   . Parkinson disease ([place])   . Personal history of radiation therapy 06/2018-07/2018   right breast ca  . PONV (postoperative nausea and vomiting)     Past Surgical History:  Procedure Laterality Date  . BREAST BIOPSY Right 03/14/2018   11:00 DCIS and invasive ductal carcinoma  . BREAST BIOPSY Right 03/14/2018   1:00 Invasive ductal carcinoma  . BREAST LUMPECTOMY Right 04/27/2018   lumpectomy of 11 and 1:00 cancers, clear margins, negative LN  . BREAST LUMPECTOMY WITH RADIOACTIVE SEED AND SENTINEL LYMPH NODE BIOPSY Right 04/27/2018   Procedure: RIGHT BREAST LUMPECTOMY WITH RADIOACTIVE SEED X 2 AND RIGHT SENTINEL LYMPH NODE BIOPSY;  Surgeon: [name], MD;  Location: [facility];  Service:  General;  Laterality: Right;  . CATARACT EXTRACTION W/PHACO Left 08/30/2019   Procedure: CATARACT EXTRACTION PHACO AND INTRAOCULAR LENS PLACEMENT ([place]) LEFT panoptix toric  01:20.5  12.7%  10.26;  Surgeon: Leandrew Koyanagi, MD;  Location: [facility];  Service: Ophthalmology;  Laterality: Left;  . CATARACT EXTRACTION W/PHACO Right 09/20/2019   Procedure: CATARACT EXTRACTION PHACO AND INTRAOCULAR LENS PLACEMENT ([facility]) RIGHT 5.71  00:36.4  15.7%;  Surgeon: Leandrew Koyanagi, MD;  Location: [facility];  Service: Ophthalmology;  Laterality: Right;  . LAPAROTOMY N/A 02/10/2018   Procedure: EXPLORATORY LAPAROTOMY;  Surgeon: Isabel Caprice, MD;  Location: [facility];  Service: Gynecology;  Laterality: N/A;  . MASS EXCISION  01/2018   abdominal  . OVARIAN CYST REMOVAL    . SALPINGOOPHORECTOMY Bilateral 02/10/2018   Procedure: BILATERAL SALPINGO OOPHORECTOMY; PERITONEAL WASHINGS;  Surgeon: Isabel Caprice, MD;  Location: [facility];  Service: Gynecology;  Laterality: Bilateral;  . TONSILLECTOMY    . TONSILLECTOMY AND ADENOIDECTOMY  1953    There were no vitals filed for this visit.   Subjective Assessment - 11/14/20 1334    Subjective Patient report the previous night she had increased dizziness when going from sitting to standing. Patient states dizziness lasted around 1 min.    Pertinent History Patient report unclear regarding onset. Visited MD on Sept 16 and received injection with relief  for approximately 1 month. Reports she cannot receive an additional injection pending eye surgery. Received dx from MD of hip bursitis in B hips. Reports pain with walking, stanindg, sitting 5/10 current 7/10 worst 3/10 best, with temporary relief when she is sitting "just right" and with naproxen. Agg: activity. Cormorbidities: hx of breast cancer, HTN, MCI, insomnia, cataracts    Limitations Sitting;Standing;Walking;House hold activities    How long can you sit comfortably? Pain constant, reduced  with neutral pelvic tilt    How long can you stand comfortably? Pain constant, increases with time    How long can you walk comfortably? Pain constant, increases with time    Patient Stated Goals Be able to move without hurting, walk 1 hour    Currently in Pain? No/denies             TREATMENT Therapeutic Exercise Seated reach outs, and down, out and back - x 15 Side stepping with ball toss forward reaching outside of COG - 4 x 103f  Backwards walking with ball toss outside of COG -- 4 x 363fWalking backwards with rotating and finding ball/hand off - 4 x 3318fackwards walking with busy backgrounds - 5 x 62f73f cirlces - x50ft20fkwards Wall spinning along wall for vestibular challenge - x 10 Stairs up/down without UE support - x5 (4 steps)     Ambulation with focus on improving walking speed with head turns following ball with eyes performing rotations - x 200ft 19f Performed exercises to improve dizziness symptoms     PT Education - 11/14/20 1337    Education provided Yes    Education Details form/technique with exercise    Person(s) Educated Patient    Methods Explanation;Demonstration    Comprehension Returned demonstration;Verbalized understanding            PT Short Term Goals - 09/12/19 1419      PT SHORT TERM GOAL #1   Title Patient will demonstrate complaince with HEP to speed recovery and reduce total number of visits.    Baseline HEP given    Time 2    Period Weeks    Status New    Target Date 09/27/19             PT Long Term Goals - 11/05/20 1318      PT LONG TERM GOAL #1   Title Patient will improve hip strength to 4/5 grossly to facilitate improved mobility with ADLs.    Baseline 3, 4-; 12/22 deferred to NV; 11/09/2019: 4-/5; 12/07/2019 L hip flexor 4, abd and ext 3+; 01/29/2020: 4/5 for most hip motions, 3+/5 for abd, ext; 03/20/2020: 4/5 for hip movements    Time 8    Period Weeks    Status Achieved      PT LONG TERM GOAL #2   Title  Patient will demonstrate walking tolerance increased to 30 minutes without increase in symptoms to demonstrate improved ability to perform walking program for cardiovascular health.    Baseline 10 minute tolerance (patient report); 12/22 10 minutes (per pt report); 11/09/2019: 7 min; 12/07/2019 unable to assess due to icy streets;  01/29/2020: 10 min; 03/20/2020 10 min; 05/14/2020: 10 min x 3 per day; 06/11/2020: 10min 75mper day; 07/15/2020: 10min x79mer day; 09/02/2020: 10min x 80must standing rest breaks in between); 11/06/2019: 10 min x 3    Time 8    Period Weeks    Status Partially Met  PT LONG TERM GOAL #3   Title Patient will report worst pain of 3/10 during therapy to demonstrate reduced disability    Baseline Worst 7/10; 12/22 worst 9/10; 11/09/2019: 9/10; 12/07/2019 7/10; 01/29/2020: 7/10; 05/14/2020: 0/ 10    Time 8    Period Weeks    Status Achieved      PT LONG TERM GOAL #4   Title Patient will demonstrate ability to perform all bed mobility maneuvers without increase in pain for reduced disability with mobility and transfers with ADLs.    Baseline Reports pain with bed mobility supine <> sidelying L/R, sidelying to sitting EOB; 12/22 pt able to perform I without increased pain    Time 8    Period Weeks    Status Achieved      PT LONG TERM GOAL #5   Title Patient will improve 10MW speed to 1.0 m/sec to meet norms for minimal fall risk and full community ambulation.    Baseline 0.88 m/sec; 12/22 0.96 m/sec; 11/09/2019: deferred; 12/07/2019 0.90 m/sec; 01/29/2020: .9  m/sec; 03/20/2020: .59ms; 05/14/2020: . 9 m/s; 06/11/2020: .920m; 07/15/2020: 1 m/s    Time 8    Period Weeks    Status Achieved      Additional Long Term Goals   Additional Long Term Goals Yes      PT LONG TERM GOAL #6   Title Patient will increase 6MWT by 200 ft to demonstrate improvement in functional capacity for transition to cardiac walking program    Baseline 6MWT = 1195; 1/7/20201: deferred; 12/07/2019 1095; 01/29/2020:  deferred secondary to fatigue; 03/20/2020: [id number]/13/2021: 1130f9f1/12/2019: 800ft63f4/2022: 973ft 86fime 8    Period Weeks    Status On-going      PT LONG TERM GOAL #7   Title Patient will improve her 5xsts to under 15 sec to indicate singificnat improvement in LE functional strength and decrease in fall risk.    Baseline 24.5 sec; 03/20/2020: 19.19 sec; 06/13/2020: 14. 92    Time 6    Period Weeks    Status Achieved      PT LONG TERM GOAL #8   Title Patient will improve FGA to 28/30 to indicate improvement in dynamic balance and improvement in stability.    Baseline 23/30; 06/11/2020: 25/30; 07/15/2020: 26/30; 09/02/2020: 27/30; 11/05/2020: 28/30    Time 6    Period Weeks    Status Achieved      PT LONG TERM GOAL  #9   TITLE Patient will be able to ascend/desend the stairs without use of UEs to increase safety and decrease fall risk with stair use.    Baseline requires use of UEs    Time 6    Period Weeks    Status New    Target Date 12/31/20                 Plan - 11/14/20 1339    Clinical Impression Statement Performed exercises to address vestibular limitations, patient requires less standing rest breaks indicating functional carryover between treatment sessions. Although patient is improving, she continues to have episodic LOB with performing rotations and backwards walking. Patient will benefit from further skilled therapy to return to prior level of function.    Personal Factors and Comorbidities Age;Comorbidity 3+;Time since onset of injury/illness/exacerbation;Comorbidity 2    Comorbidities MCI, HTN, CA, cataracts    Examination-Activity Limitations Bend;Sit;Squat;Stairs;Stand;Sleep    Examination-Participation Restrictions Cleaning;Community Activity    Stability/Clinical Decision Making Evolving/Moderate complexity    Rehab  Potential Fair    Clinical Impairments Affecting Rehab Potential MCI    PT Frequency 2x / week    PT Duration 6 weeks    PT  Treatment/Interventions ADLs/Self Care Home Management;Therapeutic exercise;Patient/family education;Neuromuscular re-education;Therapeutic activities;Functional mobility training;Balance training;Manual techniques;Gait training;Stair training;Moist Heat;Electrical Stimulation;Cryotherapy;Taping;Dry needling;Energy conservation;Passive range of motion;Joint Manipulations    PT Next Visit Plan Progress glute therex, pelvic-hip dissociation; check and progress HEP with stretches    PT Home Exercise Plan lateral step ups, step ups, hip kickers ext/abd red theraband    Consulted and Agree with Plan of Care Patient           Patient will benefit from skilled therapeutic intervention in order to improve the following deficits and impairments:  Decreased range of motion,Postural dysfunction,Pain,Abnormal gait,Decreased activity tolerance,Decreased coordination,Decreased endurance,Decreased balance,Decreased mobility,Difficulty walking,Hypomobility,Increased muscle spasms,Impaired perceived functional ability,Decreased strength,Impaired flexibility  Visit Diagnosis: Muscle weakness (generalized)  Difficulty in walking, not elsewhere classified     Problem List Patient Active Problem List   Diagnosis Date Noted  . Osteopenia of neck of right femur 09/06/2020  . Class 1 obesity with serious comorbidity and body mass index (BMI) of 32.0 to 32.9 in adult 08/29/2020  . Candidal skin infection 04/29/2020  . Parkinson disease ([place]) 02/22/2020  . 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 Bipolar disorder, in full remission, most recent episode depressed ([place]) 11/08/2019  . Avitaminosis D 08/24/2019  . Insomnia due to mental condition 05/31/2019  . Bipolar I disorder, most recent episode depressed ([place]) 05/31/2019  . Mild cognitive disorder 05/31/2019  . Alcohol use disorder, moderate, in sustained remission ([place]) 05/31/2019  . Elevated tumor markers 05/27/2019  . GAD (generalized anxiety disorder)  04/10/2019  . MDD (major depressive disorder), severe ([place]) 01/31/2019  . High serum carbohydrate antigen 19-9 (CA19-9) 12/01/2018  . Adjustment disorder with anxious mood 11/24/2018  . Essential hypertension 11/24/2018  . B12 deficiency 10/17/2018  . Incisional hernia, without obstruction or gangrene 09/12/2018  . Genetic testing 04/07/2018  . Malignant neoplasm of upper-outer quadrant of right breast in female, estrogen receptor positive ([place]) 03/17/2018  . Malignant neoplasm of upper-inner quadrant of right breast in female, estrogen receptor positive ([place]) 03/17/2018  . History of psychosis 02/07/2018  . History of insomnia 02/07/2018    Blythe Stanford, PT DPT 11/14/2020, 2:28 PM  [facility] [address], [place], [address] Phone: [phone]   Fax:  [phone]  Name: Hadli Vandemark MRN: [id number] Date of Birth: 08-Jul-1946

## 2020-11-19 ENCOUNTER — Ambulatory Visit: Payer: Medicare PPO

## 2020-11-21 ENCOUNTER — Ambulatory Visit: Payer: Medicare PPO

## 2020-11-25 ENCOUNTER — Telehealth: Payer: Self-pay

## 2020-11-25 DIAGNOSIS — F5105 Insomnia due to other mental disorder: Secondary | ICD-10-CM

## 2020-11-25 DIAGNOSIS — F3176 Bipolar disorder, in full remission, most recent episode depressed: Secondary | ICD-10-CM

## 2020-11-25 MED ORDER — TRAZODONE HCL 100 MG PO TABS: 100.0000 mg | ORAL_TABLET | Freq: Every day | ORAL | 0 refills | Status: DC

## 2020-11-25 NOTE — Telephone Encounter (Signed)
Returned call to patient.  Discussed with patient that since she is having trouble maintaining sleep will increase trazodone 100 mg at bedtime.  She will try this for the next couple of weeks and let writer know if she is not sleeping.

## 2020-11-25 NOTE — Telephone Encounter (Signed)
pt called left message that the trazodone is not working for her.  she thinks it needs to be changed.  she not sleeping

## 2020-11-26 ENCOUNTER — Ambulatory Visit: Payer: Medicare PPO

## 2020-11-26 LAB — COLOGUARD
COLOGUARD: POSITIVE — AB
Cologuard: POSITIVE — AB

## 2020-11-28 ENCOUNTER — Other Ambulatory Visit: Payer: Self-pay

## 2020-11-28 ENCOUNTER — Ambulatory Visit: Payer: Medicare PPO

## 2020-11-28 DIAGNOSIS — M6281 Muscle weakness (generalized): Secondary | ICD-10-CM | POA: Diagnosis not present

## 2020-11-28 DIAGNOSIS — M25552 Pain in left hip: Secondary | ICD-10-CM | POA: Diagnosis not present

## 2020-11-28 DIAGNOSIS — R262 Difficulty in walking, not elsewhere classified: Secondary | ICD-10-CM

## 2020-11-28 NOTE — Therapy (Signed)
[facility] [address], [place], [address] Phone: [phone]   Fax:  [phone]  Physical Therapy Treatment  Patient Details  Name: Kendra [name] MRN: [id number] Date of Birth: July 22, 1946 No data recorded  Encounter Date: 11/28/2020   PT End of Session - 11/28/20 1310    Visit Number 78    Number of Visits 84    Date for PT Re-Evaluation 01/01/21    PT Start Time 5361    PT Stop Time 1345    PT Time Calculation (min) 42 min    Equipment Utilized During Treatment Gait belt    Activity Tolerance Patient tolerated treatment well;No increased pain    Behavior During Therapy WFL for tasks assessed/performed           Past Medical History:  Diagnosis Date  . Anxiety   . Breast cancer ([place]) 03/2018   right breast cancer at 11:00 and 1:00  . Bursitis    bilateral hips and knees  . Complication of anesthesia   . Depression   . Family history of breast cancer 03/24/2018  . Family history of lung cancer 03/24/2018  . History of alcohol dependence ([place]) 2007   no ETOH; resolved since 2007  . History of hiatal hernia   . History of psychosis 2014   due to sleep disturbance  . Hypertension   . Parkinson disease ([place])   . Personal history of radiation therapy 06/2018-07/2018   right breast ca  . PONV (postoperative nausea and vomiting)     Past Surgical History:  Procedure Laterality Date  . BREAST BIOPSY Right 03/14/2018   11:00 DCIS and invasive ductal carcinoma  . BREAST BIOPSY Right 03/14/2018   1:00 Invasive ductal carcinoma  . BREAST LUMPECTOMY Right 04/27/2018   lumpectomy of 11 and 1:00 cancers, clear margins, negative LN  . BREAST LUMPECTOMY WITH RADIOACTIVE SEED AND SENTINEL LYMPH NODE BIOPSY Right 04/27/2018   Procedure: RIGHT BREAST LUMPECTOMY WITH RADIOACTIVE SEED X 2 AND RIGHT SENTINEL LYMPH NODE BIOPSY;  Surgeon: [name], MD;  Location: [facility];  Service:  General;  Laterality: Right;  . CATARACT EXTRACTION W/PHACO Left 08/30/2019   Procedure: CATARACT EXTRACTION PHACO AND INTRAOCULAR LENS PLACEMENT ([place]) LEFT panoptix toric  01:20.5  12.7%  10.26;  Surgeon: Leandrew Koyanagi, MD;  Location: [facility];  Service: Ophthalmology;  Laterality: Left;  . CATARACT EXTRACTION W/PHACO Right 09/20/2019   Procedure: CATARACT EXTRACTION PHACO AND INTRAOCULAR LENS PLACEMENT ([facility]) RIGHT 5.71  00:36.4  15.7%;  Surgeon: Leandrew Koyanagi, MD;  Location: [facility];  Service: Ophthalmology;  Laterality: Right;  . LAPAROTOMY N/A 02/10/2018   Procedure: EXPLORATORY LAPAROTOMY;  Surgeon: Isabel Caprice, MD;  Location: [facility];  Service: Gynecology;  Laterality: N/A;  . MASS EXCISION  01/2018   abdominal  . OVARIAN CYST REMOVAL    . SALPINGOOPHORECTOMY Bilateral 02/10/2018   Procedure: BILATERAL SALPINGO OOPHORECTOMY; PERITONEAL WASHINGS;  Surgeon: Isabel Caprice, MD;  Location: [facility];  Service: Gynecology;  Laterality: Bilateral;  . TONSILLECTOMY    . TONSILLECTOMY AND ADENOIDECTOMY  1953    There were no vitals filed for this visit.   Subjective Assessment - 11/28/20 1308    Subjective Patient states increased dizziness symptoms with walking, transfering from sitting to standing and performing head movements since increasing her Trazidone.    Pertinent History Patient report unclear regarding onset. Visited MD on Sept 16 and received injection with relief for approximately  1 month. Reports she cannot receive an additional injection pending eye surgery. Received dx from MD of hip bursitis in B hips. Reports pain with walking, stanindg, sitting 5/10 current 7/10 worst 3/10 best, with temporary relief when she is sitting "just right" and with naproxen. Agg: activity. Cormorbidities: hx of breast cancer, HTN, MCI, insomnia, cataracts    Limitations Sitting;Standing;Walking;House hold activities    How long can you sit comfortably? Pain  constant, reduced with neutral pelvic tilt    How long can you stand comfortably? Pain constant, increases with time    How long can you walk comfortably? Pain constant, increases with time    Patient Stated Goals Be able to move without hurting, walk 1 hour    Currently in Pain? No/denies               TREATMENT Therapeutic Exercise Seated reach outs, and down, out and back - x 15 Backwards walking - 2 x 54ft Forward walking with ball toss - x 172ft Side stepping with ball toss - x32ft B Head turns up/down; left/right - 4 x 26ft; 2 x 65ft Tandem ambulation - 53ft x4 Weaving in and out of 4 cones - x4 times total  EC walking forward - 4 x 32ft     Performed exercises to improve dizziness symptoms    PT Education - 11/28/20 1309    Education provided Yes    Education Details form/technique with exercise    Person(s) Educated Patient    Methods Demonstration;Explanation    Comprehension Verbalized understanding;Returned demonstration            PT Short Term Goals - 09/12/19 1419      PT SHORT TERM GOAL #1   Title Patient will demonstrate complaince with HEP to speed recovery and reduce total number of visits.    Baseline HEP given    Time 2    Period Weeks    Status New    Target Date 09/27/19             PT Long Term Goals - 11/05/20 1318      PT LONG TERM GOAL #1   Title Patient will improve hip strength to 4/5 grossly to facilitate improved mobility with ADLs.    Baseline 3, 4-; 12/22 deferred to NV; 11/09/2019: 4-/5; 12/07/2019 L hip flexor 4, abd and ext 3+; 01/29/2020: 4/5 for most hip motions, 3+/5 for abd, ext; 03/20/2020: 4/5 for hip movements    Time 8    Period Weeks    Status Achieved      PT LONG TERM GOAL #2   Title Patient will demonstrate walking tolerance increased to 30 minutes without increase in symptoms to demonstrate improved ability to perform walking program for cardiovascular health.    Baseline 10 minute tolerance (patient report);  12/22 10 minutes (per pt report); 11/09/2019: 7 min; 12/07/2019 unable to assess due to icy streets;  01/29/2020: 10 min; 03/20/2020 10 min; 05/14/2020: 10 min x 3 per day; 06/11/2020: 2min x 3 per day; 07/15/2020: 80min x 3 per day; 09/02/2020: 34min x 3 (just standing rest breaks in between); 11/06/2019: 10 min x 3    Time 8    Period Weeks    Status Partially Met      PT LONG TERM GOAL #3   Title Patient will report worst pain of 3/10 during therapy to demonstrate reduced disability    Baseline Worst 7/10; 12/22 worst 9/10; 11/09/2019: 9/10; 12/07/2019 7/10; 01/29/2020: 7/10; 05/14/2020: 0/  10    Time 8    Period Weeks    Status Achieved      PT LONG TERM GOAL #4   Title Patient will demonstrate ability to perform all bed mobility maneuvers without increase in pain for reduced disability with mobility and transfers with ADLs.    Baseline Reports pain with bed mobility supine <> sidelying L/R, sidelying to sitting EOB; 12/22 pt able to perform I without increased pain    Time 8    Period Weeks    Status Achieved      PT LONG TERM GOAL #5   Title Patient will improve speed to 1.0 m/sec to meet norms for minimal fall risk and full community ambulation.    Baseline 0.88 m/sec; 12/22 0.96 m/sec; 11/09/2019: deferred; 12/07/2019 0.90 m/sec; 01/29/2020: .9  m/sec; 03/20/2020: .54m/s; 05/14/2020: . 9 m/s; 06/11/2020: .27m/s; 07/15/2020: 1 m/s    Time 8    Period Weeks    Status Achieved      Additional Long Term Goals   Additional Long Term Goals Yes      PT LONG TERM GOAL #6   Title Patient will increase by 200 ft to demonstrate improvement in functional capacity for transition to cardiac walking program    Baseline = 1195; 1/7/20201: deferred; 12/07/2019 1095; 01/29/2020: deferred secondary to fatigue; 03/20/2020: 1046ft; 07/15/2020: 1185ft; 09/03/2020: 833ft; 11/05/2020: 960ft    Time 8    Period Weeks    Status On-going      PT LONG TERM GOAL #7   Title Patient will improve her 5xsts to under 15 sec  to indicate singificnat improvement in LE functional strength and decrease in fall risk.    Baseline 24.5 sec; 03/20/2020: 19.19 sec; 06/13/2020: 14. 92    Time 6    Period Weeks    Status Achieved      PT LONG TERM GOAL #8   Title Patient will improve FGA to 28/30 to indicate improvement in dynamic balance and improvement in stability.    Baseline 23/30; 06/11/2020: 25/30; 07/15/2020: 26/30; 09/02/2020: 27/30; 11/05/2020: 28/30    Time 6    Period Weeks    Status Achieved      PT LONG TERM GOAL  #9   TITLE Patient will be able to ascend/desend the stairs without use of UEs to increase safety and decrease fall risk with stair use.    Baseline requires use of UEs    Time 6    Period Weeks    Status New    Target Date 12/31/20                 Plan - 11/28/20 1324    Clinical Impression Statement Inrceased symptoms with exercises performed today, this is most likely from change in medication. Educated patient to monitor symptoms and contact physician if they worsen or not improve. Decreased overall intensity secondary to this. Performed decreased intensity today secondary to this. Patient will benefit from further skilled therapy to return to prior level of function.    Personal Factors and Comorbidities Age;Comorbidity 3+;Time since onset of injury/illness/exacerbation;Comorbidity 2    Comorbidities MCI, HTN, CA, cataracts    Examination-Activity Limitations Bend;Sit;Squat;Stairs;Stand;Sleep    Examination-Participation Restrictions Cleaning;Community Activity    Stability/Clinical Decision Making Evolving/Moderate complexity    Rehab Potential Fair    Clinical Impairments Affecting Rehab Potential MCI    PT Frequency 2x / week    PT Duration 6 weeks    PT Treatment/Interventions  ADLs/Self Care Home Management;Therapeutic exercise;Patient/family education;Neuromuscular re-education;Therapeutic activities;Functional mobility training;Balance training;Manual techniques;Gait  training;Stair training;Moist Heat;Electrical Stimulation;Cryotherapy;Taping;Dry needling;Energy conservation;Passive range of motion;Joint Manipulations    PT Next Visit Plan Progress glute therex, pelvic-hip dissociation; check and progress HEP with stretches    PT Home Exercise Plan lateral step ups, step ups, hip kickers ext/abd red theraband    Consulted and Agree with Plan of Care Patient           Patient will benefit from skilled therapeutic intervention in order to improve the following deficits and impairments:  Decreased range of motion,Postural dysfunction,Pain,Abnormal gait,Decreased activity tolerance,Decreased coordination,Decreased endurance,Decreased balance,Decreased mobility,Difficulty walking,Hypomobility,Increased muscle spasms,Impaired perceived functional ability,Decreased strength,Impaired flexibility  Visit Diagnosis: Muscle weakness (generalized)  Difficulty in walking, not elsewhere classified     Problem List Patient Active Problem List   Diagnosis Date Noted  . Osteopenia of neck of right femur 09/06/2020  . Class 1 obesity with serious comorbidity and body mass index (BMI) of 32.0 to 32.9 in adult 08/29/2020  . Candidal skin infection 04/29/2020  . Parkinson disease ([place]) 02/22/2020  . 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 Bipolar disorder, in full remission, most recent episode depressed ([place]) 11/08/2019  . Avitaminosis D 08/24/2019  . Insomnia due to mental condition 05/31/2019  . Bipolar I disorder, most recent episode depressed ([place]) 05/31/2019  . Mild cognitive disorder 05/31/2019  . Alcohol use disorder, moderate, in sustained remission ([place]) 05/31/2019  . Elevated tumor markers 05/27/2019  . GAD (generalized anxiety disorder) 04/10/2019  . MDD (major depressive disorder), severe ([place]) 01/31/2019  . High serum carbohydrate antigen 19-9 (CA19-9) 12/01/2018  . Adjustment disorder with anxious mood 11/24/2018  . Essential hypertension 11/24/2018  .  B12 deficiency 10/17/2018  . Incisional hernia, without obstruction or gangrene 09/12/2018  . Genetic testing 04/07/2018  . Malignant neoplasm of upper-outer quadrant of right breast in female, estrogen receptor positive ([place]) 03/17/2018  . Malignant neoplasm of upper-inner quadrant of right breast in female, estrogen receptor positive ([place]) 03/17/2018  . History of psychosis 02/07/2018  . History of insomnia 02/07/2018    Blythe Stanford, PT DPT 11/28/2020, 1:56 PM  [facility] [address] [place], [address] Phone: [phone]   Fax:  [phone]  Name: Kendra [name] MRN: [id number] Date of Birth: [birth year]

## 2020-11-29 ENCOUNTER — Telehealth: Payer: Self-pay | Admitting: Family Medicine

## 2020-11-29 NOTE — Telephone Encounter (Signed)
Called to confirm that a fax was received for patient's abnormal Cologard results.  Please call if fax was not received.  CB# [phone]

## 2020-11-29 NOTE — Telephone Encounter (Signed)
Faxed received. Dr. Brita Romp has reviewed.

## 2020-12-02 ENCOUNTER — Ambulatory Visit: Payer: Self-pay | Admitting: *Deleted

## 2020-12-02 DIAGNOSIS — R195 Other fecal abnormalities: Secondary | ICD-10-CM

## 2020-12-02 NOTE — Telephone Encounter (Signed)
Pt called in for her Cologuard test result and there was an internet disconnect.   I called her back and read the message from Dr. Brita Romp dated 11/28/2020 at 11:36 AM regarding her Cologuard result.  She is agreeable to going to a GI doctor however she requests it be someone in [place].  I sent this to Dr. Brita Romp.    Reason for Disposition . Health Information question, no triage required and triager able to answer question    Cologuard result given  Answer Assessment - Initial Assessment Questions 1. REASON FOR CALL or QUESTION: "What is your reason for calling today?" or "How can I best help you?" or "What question do you have that I can help answer?"     Pt called in.   I gave her the message from Dr. Brita Romp regarding her Cologuard result.   See documentation note.  Protocols used: INFORMATION ONLY CALL - NO TRIAGE-A-AH

## 2020-12-02 NOTE — Addendum Note (Signed)
Addended by: Ashley Royalty E on: 12/02/2020 05:23 PM   Modules accepted: Orders

## 2020-12-02 NOTE — Telephone Encounter (Signed)
Please place referral to [facility]

## 2020-12-03 ENCOUNTER — Other Ambulatory Visit: Payer: Self-pay

## 2020-12-03 ENCOUNTER — Ambulatory Visit: Payer: Medicare PPO | Attending: Family Medicine

## 2020-12-03 DIAGNOSIS — M6281 Muscle weakness (generalized): Secondary | ICD-10-CM | POA: Insufficient documentation

## 2020-12-03 DIAGNOSIS — M25551 Pain in right hip: Secondary | ICD-10-CM | POA: Diagnosis not present

## 2020-12-03 DIAGNOSIS — R262 Difficulty in walking, not elsewhere classified: Secondary | ICD-10-CM | POA: Diagnosis not present

## 2020-12-03 DIAGNOSIS — M25552 Pain in left hip: Secondary | ICD-10-CM | POA: Insufficient documentation

## 2020-12-04 ENCOUNTER — Ambulatory Visit (INDEPENDENT_AMBULATORY_CARE_PROVIDER_SITE_OTHER): Payer: Medicare PPO | Admitting: Licensed Clinical Social Worker

## 2020-12-04 DIAGNOSIS — F3176 Bipolar disorder, in full remission, most recent episode depressed: Secondary | ICD-10-CM | POA: Diagnosis not present

## 2020-12-04 DIAGNOSIS — F411 Generalized anxiety disorder: Secondary | ICD-10-CM | POA: Diagnosis not present

## 2020-12-04 NOTE — Progress Notes (Signed)
Virtual Visit via Audio Note  I connected with Kendra [name] on 12/04/20 at  1:00 PM EST by an audio enabled telemedicine application and verified that I am speaking with the correct person using two identifiers.  Location: Patient: home Provider: remote office [place], [place])   I discussed the limitations of evaluation and management by telemedicine and the availability of in person appointments. The patient expressed understanding and agreed to proceed.  I discussed the assessment and treatment plan with the patient. The patient was provided an opportunity to ask questions and all were answered. The patient agreed with the plan and demonstrated an understanding of the instructions.   The patient was advised to call back or seek an in-person evaluation if the symptoms worsen or if the condition fails to improve as anticipated.  I provided 30 minutes of non-face-to-face time during this encounter.   Aayat Hajjar R Diandre Merica, LCSW    THERAPIST PROGRESS NOTE  Session Time: 1-1:30p  Participation Level: Active  Behavioral Response: NAAlertAnxious  Type of Therapy: Individual Therapy  Treatment Goals addressed: Develop healthy cognitive patterns and beliefs about self and the world that lead to alleviation and help prevent the relapse of depression symptoms  Interventions: Solution Focused and Supportive  Summary: Kendra [name] is a 75 y.o. female who presents with continuing symptoms related to diagnosis of bipolar disorder. Pt reports that current mood is stable and that she is getting good quality and quantity of sleep.    Pt presented with a more confused thought process at today's session--pt had to ask for help to remember words a few times. When counselor asked pt questions about college life, pt was uncomfortable and changed the subject.   Discussed daily activities and daily schedule--encouraged pt to stay on a fairly structured schedule (sleep/eat/wake). Pt tries hard to  "keep it all in sync". Discussed pt continuing to enjoy dominoes and scrabble. Encouraged pt to play as much as possible as a mentally stimulating activity. Pt also enjoys painting still-life's. Pts husband is continuing to be a very attentive caregiver.  Pt attending vestibular PT regularly and massages every two weeks to help with physical tension. Pt is very good about keeping appointments organized and attending all appointments. Praised pts organization skills and encouraged her to continue make physical and emotional health management a priority.  Suicidal/Homicidal: No  Therapist Response: Debroh is working hard to participate with social contacts and initiate communications of her own needs and desires. Marialice continues to engage in physical and recreational activities that reflect increased energy and interest. Chauntelle is taking on an active role to utilize behavioral strategies to overcome depression. Recent snow/ice has slowed down physical activity other than PT. Progress at today's session is continuing to improve. Treatment to continue as indicated.  Plan: Return again in 4 weeks.  Diagnosis: Axis I: Bipolar, Depressed and Generalized Anxiety Disorder    Axis II: No diagnosis    [place]ris, LCSW 12/04/2020

## 2020-12-04 NOTE — Therapy (Signed)
[facility] [address] [place], [address] Phone: [phone]   Fax:  [phone]  Physical Therapy Treatment  Patient Details  Name: Kendra [name] MRN: [id number] Date of Birth: [birth year] No data recorded  Encounter Date: 12/03/2020   PT End of Session - 12/03/20 1359    Visit Number 79    Number of Visits 84    Date for PT Re-Evaluation 01/01/21    PT Start Time 3716    PT Stop Time 1430    PT Time Calculation (min) 38 min    Equipment Utilized During Treatment Gait belt    Activity Tolerance Patient tolerated treatment well;No increased pain    Behavior During Therapy WFL for tasks assessed/performed           Past Medical History:  Diagnosis Date  . Anxiety   . Breast cancer ([place]) 03/2018   right breast cancer at 11:00 and 1:00  . Bursitis    bilateral hips and knees  . Complication of anesthesia   . Depression   . Family history of breast cancer 03/24/2018  . Family history of lung cancer 03/24/2018  . History of alcohol dependence ([place]) 2007   no ETOH; resolved since 2007  . History of hiatal hernia   . History of psychosis 2014   due to sleep disturbance  . Hypertension   . Parkinson disease ([place])   . Personal history of radiation therapy 06/2018-07/2018   right breast ca  . PONV (postoperative nausea and vomiting)     Past Surgical History:  Procedure Laterality Date  . BREAST BIOPSY Right 03/14/2018   11:00 DCIS and invasive ductal carcinoma  . BREAST BIOPSY Right 03/14/2018   1:00 Invasive ductal carcinoma  . BREAST LUMPECTOMY Right 04/27/2018   lumpectomy of 11 and 1:00 cancers, clear margins, negative LN  . BREAST LUMPECTOMY WITH RADIOACTIVE SEED AND SENTINEL LYMPH NODE BIOPSY Right 04/27/2018   Procedure: RIGHT BREAST LUMPECTOMY WITH RADIOACTIVE SEED X 2 AND RIGHT SENTINEL LYMPH NODE BIOPSY;  Surgeon: [name], MD;  Location: [facility];  Service:  General;  Laterality: Right;  . CATARACT EXTRACTION W/PHACO Left 08/30/2019   Procedure: CATARACT EXTRACTION PHACO AND INTRAOCULAR LENS PLACEMENT ([place]) LEFT panoptix toric  01:20.5  12.7%  10.26;  Surgeon: Leandrew Koyanagi, MD;  Location: [facility];  Service: Ophthalmology;  Laterality: Left;  . CATARACT EXTRACTION W/PHACO Right 09/20/2019   Procedure: CATARACT EXTRACTION PHACO AND INTRAOCULAR LENS PLACEMENT ([facility]) RIGHT 5.71  00:36.4  15.7%;  Surgeon: Leandrew Koyanagi, MD;  Location: [facility];  Service: Ophthalmology;  Laterality: Right;  . LAPAROTOMY N/A 02/10/2018   Procedure: EXPLORATORY LAPAROTOMY;  Surgeon: Isabel Caprice, MD;  Location: [facility];  Service: Gynecology;  Laterality: N/A;  . MASS EXCISION  01/2018   abdominal  . OVARIAN CYST REMOVAL    . SALPINGOOPHORECTOMY Bilateral 02/10/2018   Procedure: BILATERAL SALPINGO OOPHORECTOMY; PERITONEAL WASHINGS;  Surgeon: Isabel Caprice, MD;  Location: [facility];  Service: Gynecology;  Laterality: Bilateral;  . TONSILLECTOMY    . TONSILLECTOMY AND ADENOIDECTOMY  1953    There were no vitals filed for this visit.   Subjective Assessment - 12/03/20 1357    Subjective Patient states the symptoms has improved compared to the previous session. Patient states she continues to have increased symptoms with walking.    Pertinent History Patient report unclear regarding onset. Visited MD on Sept 16 and received injection with relief for  approximately 1 month. Reports she cannot receive an additional injection pending eye surgery. Received dx from MD of hip bursitis in B hips. Reports pain with walking, stanindg, sitting 5/10 current 7/10 worst 3/10 best, with temporary relief when she is sitting "just right" and with naproxen. Agg: activity. Cormorbidities: hx of breast cancer, HTN, MCI, insomnia, cataracts    Limitations Sitting;Standing;Walking;House hold activities    How long can you sit comfortably? Pain constant, reduced  with neutral pelvic tilt    How long can you stand comfortably? Pain constant, increases with time    How long can you walk comfortably? Pain constant, increases with time    Patient Stated Goals Be able to move without hurting, walk 1 hour    Currently in Pain? No/denies           TREATMENT Therapeutic Exercise Seated reach outs, and down, out and back - x 15 Wall spinning along wall for vestibular challenge - x 5 Backwards walking with ball toss outside of COG -- 4 x 21f Backwards walking EC - 2 x 349fArms straight rotations following ball - 2 x 3395fWalking busy backrounds - 100f107fairs up/down without UE support - x4 (4 steps)  Ambulation with focus on improving walking speed with head turns following ball with eyes performing rotations - x 100ft74f  Performed exercises to improve dizziness symptoms     PT Education - 12/03/20 1359    Education provided Yes    Education Details form/techinque with exercise    Person(s) Educated Patient    Methods Explanation;Demonstration    Comprehension Verbalized understanding;Returned demonstration            PT Short Term Goals - 09/12/19 1419      PT SHORT TERM GOAL #1   Title Patient will demonstrate complaince with HEP to speed recovery and reduce total number of visits.    Baseline HEP given    Time 2    Period Weeks    Status New    Target Date 09/27/19             PT Long Term Goals - 11/05/20 1318      PT LONG TERM GOAL #1   Title Patient will improve hip strength to 4/5 grossly to facilitate improved mobility with ADLs.    Baseline 3, 4-; 12/22 deferred to NV; 11/09/2019: 4-/5; 12/07/2019 L hip flexor 4, abd and ext 3+; 01/29/2020: 4/5 for most hip motions, 3+/5 for abd, ext; 03/20/2020: 4/5 for hip movements    Time 8    Period Weeks    Status Achieved      PT LONG TERM GOAL #2   Title Patient will demonstrate walking tolerance increased to 30 minutes without increase in symptoms to demonstrate improved  ability to perform walking program for cardiovascular health.    Baseline 10 minute tolerance (patient report); 12/22 10 minutes (per pt report); 11/09/2019: 7 min; 12/07/2019 unable to assess due to icy streets;  01/29/2020: 10 min; 03/20/2020 10 min; 05/14/2020: 10 min x 3 per day; 06/11/2020: 10min50m per day; 07/15/2020: 10min 48mper day; 09/02/2020: 10min x58mjust standing rest breaks in between); 11/06/2019: 10 min x 3    Time 8    Period Weeks    Status Partially Met      PT LONG TERM GOAL #3   Title Patient will report worst pain of 3/10 during therapy to demonstrate reduced disability  Baseline Worst 7/10; 12/22 worst 9/10; 11/09/2019: 9/10; 12/07/2019 7/10; 01/29/2020: 7/10; 05/14/2020: 0/ 10    Time 8    Period Weeks    Status Achieved      PT LONG TERM GOAL #4   Title Patient will demonstrate ability to perform all bed mobility maneuvers without increase in pain for reduced disability with mobility and transfers with ADLs.    Baseline Reports pain with bed mobility supine <> sidelying L/R, sidelying to sitting EOB; 12/22 pt able to perform I without increased pain    Time 8    Period Weeks    Status Achieved      PT LONG TERM GOAL #5   Title Patient will improve 10MW speed to 1.0 m/sec to meet norms for minimal fall risk and full community ambulation.    Baseline 0.88 m/sec; 12/22 0.96 m/sec; 11/09/2019: deferred; 12/07/2019 0.90 m/sec; 01/29/2020: .9  m/sec; 03/20/2020: .52ms; 05/14/2020: . 9 m/s; 06/11/2020: .935m; 07/15/2020: 1 m/s    Time 8    Period Weeks    Status Achieved      Additional Long Term Goals   Additional Long Term Goals Yes      PT LONG TERM GOAL #6   Title Patient will increase 6MWT by 200 ft to demonstrate improvement in functional capacity for transition to cardiac walking program    Baseline 6MWT = 1195; 1/7/20201: deferred; 12/07/2019 1095; 01/29/2020: deferred secondary to fatigue; 03/20/2020: [id number]/13/2021: [id number]/12/2019: 800ft8f4/2022: 973ft 69fime 8     Period Weeks    Status On-going      PT LONG TERM GOAL #7   Title Patient will improve her 5xsts to under 15 sec to indicate singificnat improvement in LE functional strength and decrease in fall risk.    Baseline 24.5 sec; 03/20/2020: 19.19 sec; 06/13/2020: 14. 92    Time 6    Period Weeks    Status Achieved      PT LONG TERM GOAL #8   Title Patient will improve FGA to 28/30 to indicate improvement in dynamic balance and improvement in stability.    Baseline 23/30; 06/11/2020: 25/30; 07/15/2020: 26/30; 09/02/2020: 27/30; 11/05/2020: 28/30    Time 6    Period Weeks    Status Achieved      PT LONG TERM GOAL  #9   TITLE Patient will be able to ascend/desend the stairs without use of UEs to increase safety and decrease fall risk with stair use.    Baseline requires use of UEs    Time 6    Period Weeks    Status New    Target Date 12/31/20                 Plan - 12/04/20 1147    Clinical Impression Statement Improvement in symptoms during today's session. Patient continues to require increased sitting rest breaks secondary to fatigue and dizziness, however patient is improving in terms of challenge to her vestibular system as she had less LOB after backwards walking. Will continue to focus on improving these limitations and patient will benefit from further skilled therapy to return to prior level of funciton.    Personal Factors and Comorbidities Age;Comorbidity 3+;Time since onset of injury/illness/exacerbation;Comorbidity 2;Other    Comorbidities MCI, HTN, CA, cataracts    Examination-Activity Limitations Bend;Sit;Squat;Stairs;Stand;Sleep    Examination-Participation Restrictions Cleaning;Community Activity    Stability/Clinical Decision Making Evolving/Moderate complexity    Rehab Potential Fair    Clinical Impairments Affecting Rehab Potential  MCI    PT Frequency 2x / week    PT Duration 6 weeks    PT Treatment/Interventions ADLs/Self Care Home Management;Therapeutic  exercise;Patient/family education;Neuromuscular re-education;Therapeutic activities;Functional mobility training;Balance training;Manual techniques;Gait training;Stair training;Moist Heat;Electrical Stimulation;Cryotherapy;Taping;Dry needling;Energy conservation;Passive range of motion;Joint Manipulations    PT Next Visit Plan Progress glute therex, pelvic-hip dissociation; check and progress HEP with stretches    PT Home Exercise Plan lateral step ups, step ups, hip kickers ext/abd red theraband    Consulted and Agree with Plan of Care Patient           Patient will benefit from skilled therapeutic intervention in order to improve the following deficits and impairments:  Decreased range of motion,Postural dysfunction,Pain,Abnormal gait,Decreased activity tolerance,Decreased coordination,Decreased endurance,Decreased balance,Decreased mobility,Difficulty walking,Hypomobility,Increased muscle spasms,Impaired perceived functional ability,Decreased strength,Impaired flexibility  Visit Diagnosis: Muscle weakness (generalized)  Difficulty in walking, not elsewhere classified  Pain in left hip     Problem List Patient Active Problem List   Diagnosis Date Noted  . Osteopenia of neck of right femur 09/06/2020  . Class 1 obesity with serious comorbidity and body mass index (BMI) of 32.0 to 32.9 in adult 08/29/2020  . Candidal skin infection 04/29/2020  . Parkinson disease ([place]) 02/22/2020  . 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 Bipolar disorder, in full remission, most recent episode depressed ([place]) 11/08/2019  . Avitaminosis D 08/24/2019  . Insomnia due to mental condition 05/31/2019  . Bipolar I disorder, most recent episode depressed ([place]) 05/31/2019  . Mild cognitive disorder 05/31/2019  . Alcohol use disorder, moderate, in sustained remission ([place]) 05/31/2019  . Elevated tumor markers 05/27/2019  . GAD (generalized anxiety disorder) 04/10/2019  . MDD (major depressive disorder), severe  ([place]) 01/31/2019  . High serum carbohydrate antigen 19-9 (CA19-9) 12/01/2018  . Adjustment disorder with anxious mood 11/24/2018  . Essential hypertension 11/24/2018  . B12 deficiency 10/17/2018  . Incisional hernia, without obstruction or gangrene 09/12/2018  . Genetic testing 04/07/2018  . Malignant neoplasm of upper-outer quadrant of right breast in female, estrogen receptor positive ([place]) 03/17/2018  . Malignant neoplasm of upper-inner quadrant of right breast in female, estrogen receptor positive ([place]) 03/17/2018  . History of psychosis 02/07/2018  . History of insomnia 02/07/2018    Blythe Stanford, PT DPT 12/04/2020, 12:43 PM  [facility] [address] [place], [address] Phone: [phone]   Fax:  [phone]  Name: Kendra [name] MRN: [id number] Date of Birth: Aug 27, 1946

## 2020-12-05 ENCOUNTER — Other Ambulatory Visit: Payer: Self-pay

## 2020-12-05 ENCOUNTER — Ambulatory Visit: Payer: Medicare PPO

## 2020-12-05 DIAGNOSIS — R262 Difficulty in walking, not elsewhere classified: Secondary | ICD-10-CM | POA: Diagnosis not present

## 2020-12-05 DIAGNOSIS — M25551 Pain in right hip: Secondary | ICD-10-CM | POA: Diagnosis not present

## 2020-12-05 DIAGNOSIS — M25552 Pain in left hip: Secondary | ICD-10-CM | POA: Diagnosis not present

## 2020-12-05 DIAGNOSIS — M6281 Muscle weakness (generalized): Secondary | ICD-10-CM

## 2020-12-05 NOTE — Therapy (Signed)
[facility] [address] [place], [address] Phone: [phone]   Fax:  [phone]  Physical Therapy Treatment  Patient Details  Name: Kendra [name] MRN: [id number] Date of Birth: 1946-02-15 No data recorded  Encounter Date: 12/05/2020   PT End of Session - 12/05/20 1500    Visit Number 80    Number of Visits 84    Date for PT Re-Evaluation 01/01/21    PT Start Time 0805    PT Stop Time 0850    PT Time Calculation (min) 45 min    Equipment Utilized During Treatment Gait belt    Activity Tolerance Patient tolerated treatment well;No increased pain    Behavior During Therapy WFL for tasks assessed/performed           Past Medical History:  Diagnosis Date  . Anxiety   . Breast cancer ([place]) 03/2018   right breast cancer at 11:00 and 1:00  . Bursitis    bilateral hips and knees  . Complication of anesthesia   . Depression   . Family history of breast cancer 03/24/2018  . Family history of lung cancer 03/24/2018  . History of alcohol dependence ([place]) 2007   no ETOH; resolved since 2007  . History of hiatal hernia   . History of psychosis 2014   due to sleep disturbance  . Hypertension   . Parkinson disease ([place])   . Personal history of radiation therapy 06/2018-07/2018   right breast ca  . PONV (postoperative nausea and vomiting)     Past Surgical History:  Procedure Laterality Date  . BREAST BIOPSY Right 03/14/2018   11:00 DCIS and invasive ductal carcinoma  . BREAST BIOPSY Right 03/14/2018   1:00 Invasive ductal carcinoma  . BREAST LUMPECTOMY Right 04/27/2018   lumpectomy of 11 and 1:00 cancers, clear margins, negative LN  . BREAST LUMPECTOMY WITH RADIOACTIVE SEED AND SENTINEL LYMPH NODE BIOPSY Right 04/27/2018   Procedure: RIGHT BREAST LUMPECTOMY WITH RADIOACTIVE SEED X 2 AND RIGHT SENTINEL LYMPH NODE BIOPSY;  Surgeon: [name], MD;  Location: [facility];  Service:  General;  Laterality: Right;  . CATARACT EXTRACTION W/PHACO Left 08/30/2019   Procedure: CATARACT EXTRACTION PHACO AND INTRAOCULAR LENS PLACEMENT ([place]) LEFT panoptix toric  01:20.5  12.7%  10.26;  Surgeon: Leandrew Koyanagi, MD;  Location: [facility];  Service: Ophthalmology;  Laterality: Left;  . CATARACT EXTRACTION W/PHACO Right 09/20/2019   Procedure: CATARACT EXTRACTION PHACO AND INTRAOCULAR LENS PLACEMENT ([facility]) RIGHT 5.71  00:36.4  15.7%;  Surgeon: Leandrew Koyanagi, MD;  Location: [facility];  Service: Ophthalmology;  Laterality: Right;  . LAPAROTOMY N/A 02/10/2018   Procedure: EXPLORATORY LAPAROTOMY;  Surgeon: Isabel Caprice, MD;  Location: [facility];  Service: Gynecology;  Laterality: N/A;  . MASS EXCISION  01/2018   abdominal  . OVARIAN CYST REMOVAL    . SALPINGOOPHORECTOMY Bilateral 02/10/2018   Procedure: BILATERAL SALPINGO OOPHORECTOMY; PERITONEAL WASHINGS;  Surgeon: Isabel Caprice, MD;  Location: [facility];  Service: Gynecology;  Laterality: Bilateral;  . TONSILLECTOMY    . TONSILLECTOMY AND ADENOIDECTOMY  1953    There were no vitals filed for this visit.   Subjective Assessment - 12/05/20 1455    Subjective Patient states she is feeling woozey this morning, however reports she feeling better than she did the previous visit.    Pertinent History Patient report unclear regarding onset. Visited MD on Sept 16 and received injection with relief for approximately 1  month. Reports she cannot receive an additional injection pending eye surgery. Received dx from MD of hip bursitis in B hips. Reports pain with walking, stanindg, sitting 5/10 current 7/10 worst 3/10 best, with temporary relief when she is sitting "just right" and with naproxen. Agg: activity. Cormorbidities: hx of breast cancer, HTN, MCI, insomnia, cataracts    Limitations Sitting;Standing;Walking;House hold activities    How long can you sit comfortably? Pain constant, reduced with neutral pelvic tilt     How long can you stand comfortably? Pain constant, increases with time    How long can you walk comfortably? Pain constant, increases with time    Patient Stated Goals Be able to move without hurting, walk 1 hour    Currently in Pain? No/denies                TREATMENT Therapeutic Exercise Seated reach outs, and down, out and back - x 15 Backwards walking with ball toss outside of COG -- 4 x 17f Wall spinning along wall for vestibular challenge - x 5 Backwards walking EC - 2 x 322fCone weaving - 6 cones - x4 Cone touching with rotation and walking - 2 x 6  Walking forward EC head turns up/down; left/right - 33 ft x 2  Walking busy backrounds - 10067f Performed exercises to improve dizziness symptoms     PT Education - 12/05/20 1459    Education provided Yes    Education Details form/technique with exercise    Person(s) Educated Patient    Methods Demonstration;Explanation    Comprehension Verbalized understanding;Returned demonstration            PT Short Term Goals - 09/12/19 1419      PT SHORT TERM GOAL #1   Title Patient will demonstrate complaince with HEP to speed recovery and reduce total number of visits.    Baseline HEP given    Time 2    Period Weeks    Status New    Target Date 09/27/19             PT Long Term Goals - 11/05/20 1318      PT LONG TERM GOAL #1   Title Patient will improve hip strength to 4/5 grossly to facilitate improved mobility with ADLs.    Baseline 3, 4-; 12/22 deferred to NV; 11/09/2019: 4-/5; 12/07/2019 L hip flexor 4, abd and ext 3+; 01/29/2020: 4/5 for most hip motions, 3+/5 for abd, ext; 03/20/2020: 4/5 for hip movements    Time 8    Period Weeks    Status Achieved      PT LONG TERM GOAL #2   Title Patient will demonstrate walking tolerance increased to 30 minutes without increase in symptoms to demonstrate improved ability to perform walking program for cardiovascular health.    Baseline 10 minute tolerance (patient  report); 12/22 10 minutes (per pt report); 11/09/2019: 7 min; 12/07/2019 unable to assess due to icy streets;  01/29/2020: 10 min; 03/20/2020 10 min; 05/14/2020: 10 min x 3 per day; 06/11/2020: 58m17m 3 per day; 07/15/2020: 58mi7m3 per day; 09/02/2020: 58min3m (just standing rest breaks in between); 11/06/2019: 10 min x 3    Time 8    Period Weeks    Status Partially Met      PT LONG TERM GOAL #3   Title Patient will report worst pain of 3/10 during therapy to demonstrate reduced disability    Baseline Worst 7/10; 12/22 worst 9/10; 11/09/2019:  9/10; 12/07/2019 7/10; 01/29/2020: 7/10; 05/14/2020: 0/ 10    Time 8    Period Weeks    Status Achieved      PT LONG TERM GOAL #4   Title Patient will demonstrate ability to perform all bed mobility maneuvers without increase in pain for reduced disability with mobility and transfers with ADLs.    Baseline Reports pain with bed mobility supine <> sidelying L/R, sidelying to sitting EOB; 12/22 pt able to perform I without increased pain    Time 8    Period Weeks    Status Achieved      PT LONG TERM GOAL #5   Title Patient will improve 10MW speed to 1.0 m/sec to meet norms for minimal fall risk and full community ambulation.    Baseline 0.88 m/sec; 12/22 0.96 m/sec; 11/09/2019: deferred; 12/07/2019 0.90 m/sec; 01/29/2020: .9  m/sec; 03/20/2020: .50ms; 05/14/2020: . 9 m/s; 06/11/2020: .960m; 07/15/2020: 1 m/s    Time 8    Period Weeks    Status Achieved      Additional Long Term Goals   Additional Long Term Goals Yes      PT LONG TERM GOAL #6   Title Patient will increase 6MWT by 200 ft to demonstrate improvement in functional capacity for transition to cardiac walking program    Baseline 6MWT = 1195; 1/7/20201: deferred; 12/07/2019 1095; 01/29/2020: deferred secondary to fatigue; 03/20/2020: [id number]/13/2021: [id number]/12/2019: 800ft69f4/2022: 973ft 57fime 8    Period Weeks    Status On-going      PT LONG TERM GOAL #7   Title Patient will improve her 5xsts to under  15 sec to indicate singificnat improvement in LE functional strength and decrease in fall risk.    Baseline 24.5 sec; 03/20/2020: 19.19 sec; 06/13/2020: 14. 92    Time 6    Period Weeks    Status Achieved      PT LONG TERM GOAL #8   Title Patient will improve FGA to 28/30 to indicate improvement in dynamic balance and improvement in stability.    Baseline 23/30; 06/11/2020: 25/30; 07/15/2020: 26/30; 09/02/2020: 27/30; 11/05/2020: 28/30    Time 6    Period Weeks    Status Achieved      PT LONG TERM GOAL  #9   TITLE Patient will be able to ascend/desend the stairs without use of UEs to increase safety and decrease fall risk with stair use.    Baseline requires use of UEs    Time 6    Period Weeks    Status New    Target Date 12/31/20                 Plan - 12/05/20 1500    Clinical Impression Statement Continued to focus on improving vestibular limitation through accomodation. Patient with increased difficulties with activity focusing on cervical rotation, most notably with backwards walking. Patient does require less sitting rest breaks during today's session indicating functional carryover. Patient will benefit from further skilled therapy focused on improving these limitations to return to prior level of function.    Personal Factors and Comorbidities Age;Comorbidity 3+;Time since onset of injury/illness/exacerbation;Comorbidity 2;Other    Comorbidities MCI, HTN, CA, cataracts    Examination-Activity Limitations Bend;Sit;Squat;Stairs;Stand;Sleep    Examination-Participation Restrictions Cleaning;Community Activity    Stability/Clinical Decision Making Evolving/Moderate complexity    Rehab Potential Fair    Clinical Impairments Affecting Rehab Potential MCI    PT Frequency 2x / week    PT  Duration 6 weeks    PT Treatment/Interventions ADLs/Self Care Home Management;Therapeutic exercise;Patient/family education;Neuromuscular re-education;Therapeutic activities;Functional mobility  training;Balance training;Manual techniques;Gait training;Stair training;Moist Heat;Electrical Stimulation;Cryotherapy;Taping;Dry needling;Energy conservation;Passive range of motion;Joint Manipulations    PT Next Visit Plan Progress glute therex, pelvic-hip dissociation; check and progress HEP with stretches    PT Home Exercise Plan lateral step ups, step ups, hip kickers ext/abd red theraband    Consulted and Agree with Plan of Care Patient           Patient will benefit from skilled therapeutic intervention in order to improve the following deficits and impairments:  Decreased range of motion,Postural dysfunction,Pain,Abnormal gait,Decreased activity tolerance,Decreased coordination,Decreased endurance,Decreased balance,Decreased mobility,Difficulty walking,Hypomobility,Increased muscle spasms,Impaired perceived functional ability,Decreased strength,Impaired flexibility  Visit Diagnosis: Muscle weakness (generalized)  Difficulty in walking, not elsewhere classified  Pain in left hip     Problem List Patient Active Problem List   Diagnosis Date Noted  . Osteopenia of neck of right femur 09/06/2020  . Class 1 obesity with serious comorbidity and body mass index (BMI) of 32.0 to 32.9 in adult 08/29/2020  . Candidal skin infection 04/29/2020  . Parkinson disease ([place]) 02/22/2020  . 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 Bipolar disorder, in full remission, most recent episode depressed ([place]) 11/08/2019  . Avitaminosis D 08/24/2019  . Insomnia due to mental condition 05/31/2019  . Bipolar I disorder, most recent episode depressed ([place]) 05/31/2019  . Mild cognitive disorder 05/31/2019  . Alcohol use disorder, moderate, in sustained remission ([place]) 05/31/2019  . Elevated tumor markers 05/27/2019  . GAD (generalized anxiety disorder) 04/10/2019  . MDD (major depressive disorder), severe ([place]) 01/31/2019  . High serum carbohydrate antigen 19-9 (CA19-9) 12/01/2018  . Adjustment disorder  with anxious mood 11/24/2018  . Essential hypertension 11/24/2018  . B12 deficiency 10/17/2018  . Incisional hernia, without obstruction or gangrene 09/12/2018  . Genetic testing 04/07/2018  . Malignant neoplasm of upper-outer quadrant of right breast in female, estrogen receptor positive ([place]) 03/17/2018  . Malignant neoplasm of upper-inner quadrant of right breast in female, estrogen receptor positive ([place]) 03/17/2018  . History of psychosis 02/07/2018  . History of insomnia 02/07/2018    Blythe Stanford, PT DPT 12/05/2020, 3:07 PM  [facility] [address], [place], [address] Phone: [phone]   Fax:  [phone]  Name: Kendra [name] MRN: [id number] Date of Birth: 1945-11-26

## 2020-12-10 ENCOUNTER — Ambulatory Visit: Payer: Medicare PPO

## 2020-12-10 ENCOUNTER — Other Ambulatory Visit: Payer: Self-pay

## 2020-12-10 DIAGNOSIS — R262 Difficulty in walking, not elsewhere classified: Secondary | ICD-10-CM

## 2020-12-10 DIAGNOSIS — M6281 Muscle weakness (generalized): Secondary | ICD-10-CM

## 2020-12-10 DIAGNOSIS — M25552 Pain in left hip: Secondary | ICD-10-CM | POA: Diagnosis not present

## 2020-12-10 DIAGNOSIS — M25551 Pain in right hip: Secondary | ICD-10-CM | POA: Diagnosis not present

## 2020-12-10 NOTE — Therapy (Signed)
[facility] [address], [place], [address] Phone: [phone]   Fax:  [phone]  Physical Therapy Treatment  Patient Details  Name: Kendra [name] MRN: [id number] Date of Birth: 1946/09/20 No data recorded  Encounter Date: 12/10/2020   PT End of Session - 12/10/20 1443    Visit Number 81    Number of Visits 84    Date for PT Re-Evaluation 01/01/21    PT Start Time 1430    PT Stop Time 1515    PT Time Calculation (min) 45 min    Equipment Utilized During Treatment Gait belt    Activity Tolerance Patient tolerated treatment well;No increased pain    Behavior During Therapy WFL for tasks assessed/performed           Past Medical History:  Diagnosis Date  . Anxiety   . Breast cancer ([place]) 03/2018   right breast cancer at 11:00 and 1:00  . Bursitis    bilateral hips and knees  . Complication of anesthesia   . Depression   . Family history of breast cancer 03/24/2018  . Family history of lung cancer 03/24/2018  . History of alcohol dependence ([place]) 2007   no ETOH; resolved since 2007  . History of hiatal hernia   . History of psychosis 2014   due to sleep disturbance  . Hypertension   . Parkinson disease ([place])   . Personal history of radiation therapy 06/2018-07/2018   right breast ca  . PONV (postoperative nausea and vomiting)     Past Surgical History:  Procedure Laterality Date  . BREAST BIOPSY Right 03/14/2018   11:00 DCIS and invasive ductal carcinoma  . BREAST BIOPSY Right 03/14/2018   1:00 Invasive ductal carcinoma  . BREAST LUMPECTOMY Right 04/27/2018   lumpectomy of 11 and 1:00 cancers, clear margins, negative LN  . BREAST LUMPECTOMY WITH RADIOACTIVE SEED AND SENTINEL LYMPH NODE BIOPSY Right 04/27/2018   Procedure: RIGHT BREAST LUMPECTOMY WITH RADIOACTIVE SEED X 2 AND RIGHT SENTINEL LYMPH NODE BIOPSY;  Surgeon: [name], MD;  Location: [facility];  Service:  General;  Laterality: Right;  . CATARACT EXTRACTION W/PHACO Left 08/30/2019   Procedure: CATARACT EXTRACTION PHACO AND INTRAOCULAR LENS PLACEMENT ([place]) LEFT panoptix toric  01:20.5  12.7%  10.26;  Surgeon: Leandrew Koyanagi, MD;  Location: [facility];  Service: Ophthalmology;  Laterality: Left;  . CATARACT EXTRACTION W/PHACO Right 09/20/2019   Procedure: CATARACT EXTRACTION PHACO AND INTRAOCULAR LENS PLACEMENT ([facility]) RIGHT 5.71  00:36.4  15.7%;  Surgeon: Leandrew Koyanagi, MD;  Location: [facility];  Service: Ophthalmology;  Laterality: Right;  . LAPAROTOMY N/A 02/10/2018   Procedure: EXPLORATORY LAPAROTOMY;  Surgeon: Isabel Caprice, MD;  Location: [facility];  Service: Gynecology;  Laterality: N/A;  . MASS EXCISION  01/2018   abdominal  . OVARIAN CYST REMOVAL    . SALPINGOOPHORECTOMY Bilateral 02/10/2018   Procedure: BILATERAL SALPINGO OOPHORECTOMY; PERITONEAL WASHINGS;  Surgeon: Isabel Caprice, MD;  Location: [facility];  Service: Gynecology;  Laterality: Bilateral;  . TONSILLECTOMY    . TONSILLECTOMY AND ADENOIDECTOMY  1953    There were no vitals filed for this visit.   Subjective Assessment - 12/10/20 1353    Subjective Patient states that she feels "uncoordinated" and cannot stop without having to grab on to something. Woozyness is the same as it was last visit.    Pertinent History Patient report unclear regarding onset. Visited MD on Sept 16 and received  injection with relief for approximately 1 month. Reports she cannot receive an additional injection pending eye surgery. Received dx from MD of hip bursitis in B hips. Reports pain with walking, stanindg, sitting 5/10 current 7/10 worst 3/10 best, with temporary relief when she is sitting "just right" and with naproxen. Agg: activity. Cormorbidities: hx of breast cancer, HTN, MCI, insomnia, cataracts    Limitations Sitting;Standing;Walking;House hold activities    How long can you sit comfortably? Pain constant, reduced  with neutral pelvic tilt    How long can you stand comfortably? Pain constant, increases with time    How long can you walk comfortably? Pain constant, increases with time    Patient Stated Goals Be able to move without hurting, walk 1 hour    Currently in Pain? No/denies           Therapeutic Exercise  Seated reach outs, and down, out and back - x 15 Wall spinning along wall for vestibular challenge - x 10 Karaoke walking- 2x16f Side stepping around in a circle touching colored cones- 571m Hurdle walking with foot tap on cones- 3x6 hurdles Backwards walking with ball finding - 2 x 3360f Performed exercises to improve dizziness symptoms        PT Education - 12/10/20 1434    Education provided Yes    Education Details form/technique with exercise    Person(s) Educated Patient    Methods Explanation;Demonstration    Comprehension Verbalized understanding;Returned demonstration            PT Short Term Goals - 09/12/19 1419      PT SHORT TERM GOAL #1   Title Patient will demonstrate complaince with HEP to speed recovery and reduce total number of visits.    Baseline HEP given    Time 2    Period Weeks    Status New    Target Date 09/27/19             PT Long Term Goals - 11/05/20 1318      PT LONG TERM GOAL #1   Title Patient will improve hip strength to 4/5 grossly to facilitate improved mobility with ADLs.    Baseline 3, 4-; 12/22 deferred to NV; 11/09/2019: 4-/5; 12/07/2019 L hip flexor 4, abd and ext 3+; 01/29/2020: 4/5 for most hip motions, 3+/5 for abd, ext; 03/20/2020: 4/5 for hip movements    Time 8    Period Weeks    Status Achieved      PT LONG TERM GOAL #2   Title Patient will demonstrate walking tolerance increased to 30 minutes without increase in symptoms to demonstrate improved ability to perform walking program for cardiovascular health.    Baseline 10 minute tolerance (patient report); 12/22 10 minutes (per pt report); 11/09/2019: 7 min;  12/07/2019 unable to assess due to icy streets;  01/29/2020: 10 min; 03/20/2020 10 min; 05/14/2020: 10 min x 3 per day; 06/11/2020: 68m21m 3 per day; 07/15/2020: 68mi68m3 per day; 09/02/2020: 68min79m (just standing rest breaks in between); 11/06/2019: 10 min x 3    Time 8    Period Weeks    Status Partially Met      PT LONG TERM GOAL #3   Title Patient will report worst pain of 3/10 during therapy to demonstrate reduced disability    Baseline Worst 7/10; 12/22 worst 9/10; 11/09/2019: 9/10; 12/07/2019 7/10; 01/29/2020: 7/10; 05/14/2020: 0/ 10    Time 8    Period Weeks  Status Achieved      PT LONG TERM GOAL #4   Title Patient will demonstrate ability to perform all bed mobility maneuvers without increase in pain for reduced disability with mobility and transfers with ADLs.    Baseline Reports pain with bed mobility supine <> sidelying L/R, sidelying to sitting EOB; 12/22 pt able to perform I without increased pain    Time 8    Period Weeks    Status Achieved      PT LONG TERM GOAL #5   Title Patient will improve 10MW speed to 1.0 m/sec to meet norms for minimal fall risk and full community ambulation.    Baseline 0.88 m/sec; 12/22 0.96 m/sec; 11/09/2019: deferred; 12/07/2019 0.90 m/sec; 01/29/2020: .9  m/sec; 03/20/2020: .80ms; 05/14/2020: . 9 m/s; 06/11/2020: .956m; 07/15/2020: 1 m/s    Time 8    Period Weeks    Status Achieved      Additional Long Term Goals   Additional Long Term Goals Yes      PT LONG TERM GOAL #6   Title Patient will increase 6MWT by 200 ft to demonstrate improvement in functional capacity for transition to cardiac walking program    Baseline 6MWT = 1195; 1/7/20201: deferred; 12/07/2019 1095; 01/29/2020: deferred secondary to fatigue; 03/20/2020: 107052f9/13/2021: 1130f2f1/12/2019: 800ft22f4/2022: 973ft 84fime 8    Period Weeks    Status On-going      PT LONG TERM GOAL #7   Title Patient will improve her 5xsts to under 15 sec to indicate singificnat improvement in LE functional  strength and decrease in fall risk.    Baseline 24.5 sec; 03/20/2020: 19.19 sec; 06/13/2020: 14. 92    Time 6    Period Weeks    Status Achieved      PT LONG TERM GOAL #8   Title Patient will improve FGA to 28/30 to indicate improvement in dynamic balance and improvement in stability.    Baseline 23/30; 06/11/2020: 25/30; 07/15/2020: 26/30; 09/02/2020: 27/30; 11/05/2020: 28/30    Time 6    Period Weeks    Status Achieved      PT LONG TERM GOAL  #9   TITLE Patient will be able to ascend/desend the stairs without use of UEs to increase safety and decrease fall risk with stair use.    Baseline requires use of UEs    Time 6    Period Weeks    Status New    Target Date 12/31/20                 Plan - 12/10/20 1441    Clinical Impression Statement Continued to focus on improving vestibular limitation. Patient had increased diffuculty with dual tasking activity such as tapping foot on cone when walking over hurdles. Patient required minimal sitting rest breaks during todays session indicating functional carryover. Patient will benefit from further skilled therapy focused on improving these limitations to return to prior level of function.    Personal Factors and Comorbidities Age;Comorbidity 3+;Time since onset of injury/illness/exacerbation;Comorbidity 2;Other    Comorbidities MCI, HTN, CA, cataracts    Examination-Activity Limitations Bend;Sit;Squat;Stairs;Stand;Sleep    Examination-Participation Restrictions Cleaning;Community Activity    Stability/Clinical Decision Making Evolving/Moderate complexity    Rehab Potential Fair    Clinical Impairments Affecting Rehab Potential MCI    PT Frequency 2x / week    PT Duration 6 weeks    PT Treatment/Interventions ADLs/Self Care Home Management;Therapeutic exercise;Patient/family education;Neuromuscular re-education;Therapeutic activities;Functional mobility training;Balance training;Manual techniques;Gait training;Stair  training;Moist  Heat;Electrical Stimulation;Cryotherapy;Taping;Dry needling;Energy conservation;Passive range of motion;Joint Manipulations    PT Next Visit Plan Progress glute therex, pelvic-hip dissociation; check and progress HEP with stretches    PT Home Exercise Plan lateral step ups, step ups, hip kickers ext/abd red theraband    Consulted and Agree with Plan of Care Patient           Patient will benefit from skilled therapeutic intervention in order to improve the following deficits and impairments:  Decreased range of motion,Postural dysfunction,Pain,Abnormal gait,Decreased activity tolerance,Decreased coordination,Decreased endurance,Decreased balance,Decreased mobility,Difficulty walking,Hypomobility,Increased muscle spasms,Impaired perceived functional ability,Decreased strength,Impaired flexibility  Visit Diagnosis: Muscle weakness (generalized)  Difficulty in walking, not elsewhere classified  Pain in left hip     Problem List Patient Active Problem List   Diagnosis Date Noted  . Osteopenia of neck of right femur 09/06/2020  . Class 1 obesity with serious comorbidity and body mass index (BMI) of 32.0 to 32.9 in adult 08/29/2020  . Candidal skin infection 04/29/2020  . Parkinson disease ([place]) 02/22/2020  . 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 Bipolar disorder, in full remission, most recent episode depressed ([place]) 11/08/2019  . Avitaminosis D 08/24/2019  . Insomnia due to mental condition 05/31/2019  . Bipolar I disorder, most recent episode depressed ([place]) 05/31/2019  . Mild cognitive disorder 05/31/2019  . Alcohol use disorder, moderate, in sustained remission ([place]) 05/31/2019  . Elevated tumor markers 05/27/2019  . GAD (generalized anxiety disorder) 04/10/2019  . MDD (major depressive disorder), severe ([place]) 01/31/2019  . High serum carbohydrate antigen 19-9 (CA19-9) 12/01/2018  . Adjustment disorder with anxious mood 11/24/2018  . Essential hypertension 11/24/2018  . B12  deficiency 10/17/2018  . Incisional hernia, without obstruction or gangrene 09/12/2018  . Genetic testing 04/07/2018  . Malignant neoplasm of upper-outer quadrant of right breast in female, estrogen receptor positive ([place]) 03/17/2018  . Malignant neoplasm of upper-inner quadrant of right breast in female, estrogen receptor positive ([place]) 03/17/2018  . History of psychosis 02/07/2018  . History of insomnia 02/07/2018    Candi Leash SPT 12/10/2020, 2:44 PM  [facility] [address] [place], [address] Phone: [phone]   Fax:  [phone]  Name: Kendra [name] MRN: [id number] Date of Birth: Aug 15, 1946

## 2020-12-11 ENCOUNTER — Encounter: Payer: Self-pay | Admitting: Psychiatry

## 2020-12-11 ENCOUNTER — Telehealth (INDEPENDENT_AMBULATORY_CARE_PROVIDER_SITE_OTHER): Payer: Medicare PPO | Admitting: Psychiatry

## 2020-12-11 DIAGNOSIS — F411 Generalized anxiety disorder: Secondary | ICD-10-CM

## 2020-12-11 DIAGNOSIS — F09 Unspecified mental disorder due to known physiological condition: Secondary | ICD-10-CM | POA: Diagnosis not present

## 2020-12-11 DIAGNOSIS — F5105 Insomnia due to other mental disorder: Secondary | ICD-10-CM | POA: Diagnosis not present

## 2020-12-11 DIAGNOSIS — F3176 Bipolar disorder, in full remission, most recent episode depressed: Secondary | ICD-10-CM | POA: Diagnosis not present

## 2020-12-11 MED ORDER — FLUOXETINE HCL 60 MG PO TABS: 60.0000 mg | ORAL_TABLET | Freq: Every day | ORAL | 1 refills | Status: DC

## 2020-12-11 NOTE — Progress Notes (Signed)
Virtual Visit via Telephone Note  I connected with Kendra [name] on 12/11/20 at  2:00 PM EST by telephone and verified that I am speaking with the correct person using two identifiers.  Location Provider Location : [facility] Patient Location : Home  Participants: Patient , Provider   I discussed the limitations, risks, security and privacy concerns of performing an evaluation and management service by telephone and the availability of in person appointments. I also discussed with the patient that there may be a patient responsible charge related to this service. The patient expressed understanding and agreed to proceed.   I discussed the assessment and treatment plan with the patient. The patient was provided an opportunity to ask questions and all were answered. The patient agreed with the plan and demonstrated an understanding of the instructions.   The patient was advised to call back or seek an in-person evaluation if the symptoms worsen or if the condition fails to improve as anticipated.   [facility] MD OP Progress Note  12/11/2020 8:48 PM Kendra [name]  MRN:  [id number]  Chief Complaint:  Chief Complaint    Follow-up     HPI: Kendra [name] is a 75 year old Caucasian female, married, lives in [place], has a history of bipolar disorder, GAD, insomnia, cognitive disorder, multifocal stage I right breast cancer status post treatment-lumpectomy, ovarian cyst removal, Parkinson's disease, bilateral hearing loss was evaluated by telemedicine today.  Patient today appeared to have problems with her hearing, reported that she got a new hearing aid and is still trying to work with it and get adjusted to it.  Questions had to be repeated several times during the session due to patient's problem with hearing.  Patient also with cognitive issues appears to struggle with short-term memory problems, problems with  recall during the session today.  Patient needed to be redirected and prompted several  times during the session.  Patient appeared however to be alert, oriented to person place time and situation.  She was able to answer appropriately to questions asked even though questions had to be repeated several times.  Patient also was able to give her date of birth, her correct address as well as the names of her medications.  Patient reports she is currently doing well with regards to her mood.  Does report anxiety about her health problems including her Parkinson's disease however is coping well.  She reports sleep is disrupted on and off however with the trazodone she is able to sleep okay and currently reports she does not want any medication readjustment.  Patient denies any suicidality, homicidality or perceptual disturbances.  Patient denies any other concerns today.  Visit Diagnosis:    ICD-10-CM   1. Bipolar disorder, in full remission, most recent episode depressed ([place])  F31.76   2. GAD (generalized anxiety disorder)  F41.1 FLUoxetine HCl 60 MG TABS  3. Insomnia due to mental condition  F51.05 FLUoxetine HCl 60 MG TABS  4. Cognitive disorder  F09    likely mild    Past Psychiatric History: I have reviewed past psychiatric history from my progress note on 03/01/2019.  Past trials of risperidone, Prozac, Seroquel, Ativan.  Past Medical History:  Past Medical History:  Diagnosis Date   Anxiety    Breast cancer ([place]) 03/2018   right breast cancer at 11:00 and 1:00   Bursitis    bilateral hips and knees   Complication of anesthesia    Depression    Family history of breast cancer 03/24/2018  Family history of lung cancer 03/24/2018   History of alcohol dependence ([place]) 2007   no ETOH; resolved since 2007   History of hiatal hernia    History of psychosis 2014   due to sleep disturbance   Hypertension    Parkinson disease ([place])    Personal history of radiation therapy 06/2018-07/2018   right breast ca   PONV (postoperative nausea and vomiting)      Past Surgical History:  Procedure Laterality Date   BREAST BIOPSY Right 03/14/2018   11:00 DCIS and invasive ductal carcinoma   BREAST BIOPSY Right 03/14/2018   1:00 Invasive ductal carcinoma   BREAST LUMPECTOMY Right 04/27/2018   lumpectomy of 11 and 1:00 cancers, clear margins, negative LN   BREAST LUMPECTOMY WITH RADIOACTIVE SEED AND SENTINEL LYMPH NODE BIOPSY Right 04/27/2018   Procedure: RIGHT BREAST LUMPECTOMY WITH RADIOACTIVE SEED X 2 AND RIGHT SENTINEL LYMPH NODE BIOPSY;  Surgeon: [name], MD;  Location: [facility];  Service: General;  Laterality: Right;   CATARACT EXTRACTION W/PHACO Left 08/30/2019   Procedure: CATARACT EXTRACTION PHACO AND INTRAOCULAR LENS PLACEMENT ([place]) LEFT panoptix toric  01:20.5  12.7%  10.26;  Surgeon: Leandrew Koyanagi, MD;  Location: [facility];  Service: Ophthalmology;  Laterality: Left;   CATARACT EXTRACTION W/PHACO Right 09/20/2019   Procedure: CATARACT EXTRACTION PHACO AND INTRAOCULAR LENS PLACEMENT ([facility]) RIGHT 5.71  00:36.4  15.7%;  Surgeon: Leandrew Koyanagi, MD;  Location: [facility];  Service: Ophthalmology;  Laterality: Right;   LAPAROTOMY N/A 02/10/2018   Procedure: EXPLORATORY LAPAROTOMY;  Surgeon: Isabel Caprice, MD;  Location: [facility];  Service: Gynecology;  Laterality: N/A;   MASS EXCISION  01/2018   abdominal   OVARIAN CYST REMOVAL     SALPINGOOPHORECTOMY Bilateral 02/10/2018   Procedure: BILATERAL SALPINGO OOPHORECTOMY; PERITONEAL WASHINGS;  Surgeon: Isabel Caprice, MD;  Location: [facility];  Service: Gynecology;  Laterality: Bilateral;   TONSILLECTOMY     TONSILLECTOMY AND ADENOIDECTOMY  1953    Family Psychiatric History: I have reviewed family psychiatric history from my progress note on 03/01/2019  Family History:  Family History  Problem Relation Age of Onset   Diabetes Mother    Breast cancer Mother 44   Hypertension Mother    Lung cancer Father        asbestos  exposure   Heart disease Brother 68   Breast cancer Cousin 65       paternal cousin   Heart disease Paternal Grandmother 43   Heart disease Paternal Grandfather     Social History: Reviewed social history from my progress note on 03/01/2019 Social History   Socioeconomic History   Marital status: Married    Spouse name: robert   Number of children: 0   Years of education: Not on file   Highest education level: Some college, no degree  Occupational History   Occupation: retired Furniture conservator/restorer of education  Tobacco Use   Smoking status: Never Smoker   Smokeless tobacco: Never Used  Scientific laboratory technician Use: Never used  Substance and Sexual Activity   Alcohol use: Not Currently    Comment: alcohol dependence prior to 2007   Drug use: Never   Sexual activity: Yes    Partners: Male    Birth control/protection: Surgical  Other Topics Concern   Not on file  Social History Narrative   Not on file   Social Determinants of Health   Financial Resource Strain: Low Risk  Difficulty of Paying Living Expenses: Not hard at all  Food Insecurity: No Food Insecurity   Worried About [place] in the Last Year: Never true   Ran Out of Food in the Last Year: Never true  Transportation Needs: No Transportation Needs   Lack of Transportation (Medical): No   Lack of Transportation (Non-Medical): No  Physical Activity: Inactive   Days of Exercise per Week: 0 days   Minutes of Exercise per Session: 0 min  Stress: Stress Concern Present   Feeling of Stress : To some extent  Social Connections: Moderately Integrated   Frequency of Communication with Friends and Family: More than three times a week   Frequency of Social Gatherings with Friends and Family: More than three times a week   Attends Religious Services: Never   Marine scientist or Organizations: Yes   Attends Archivist Meetings: 1 to 4 times per year   Marital  Status: Married    Allergies:  Allergies  Allergen Reactions   Hydroxyzine Anaphylaxis    Tongue swollen   Other Other (See Comments)    Food poisoning    Shrimp [Shellfish Allergy] Other (See Comments)    Food poisoning     Metabolic Disorder Labs: Lab Results  Component Value Date   HGBA1C 5.4 04/13/2019   Lab Results  Component Value Date   PROLACTIN 101.9 06/20/2015   Lab Results  Component Value Date   CHOL 173 04/13/2019   TRIG 78 04/13/2019   HDL 62 04/13/2019   [place] 95 04/13/2019   LDLCALC 103 06/02/2017   Lab Results  Component Value Date   TSH 2.31 04/13/2019   TSH 2.543 07/25/2018    Therapeutic Level Labs: No results found for: LITHIUM No results found for: VALPROATE No components found for:  CBMZ  Current Medications: Current Outpatient Medications  Medication Sig Dispense Refill   Acetaminophen 500 MG capsule Take 1,000 mg by mouth every 6 (six) hours as needed.      carbidopa-levodopa (SINEMET IR) 25-100 MG tablet Take 2 tablets by mouth 4 (four) times daily.      carbidopa-levodopa (SINEMET IR) 25-100 MG tablet Take 1 tablet by mouth at bedtime.      cyanocobalamin (,VITAMIN B-12,) 1000 MCG/ML injection Inject 1 mL (1,000 mcg total) into the muscle every 30 (thirty) days. 3 mL 3   FLUoxetine HCl 60 MG TABS Take 60 mg by mouth daily. 90 tablet 1   letrozole (FEMARA) 2.5 MG tablet Take 1 tablet (2.5 mg total) by mouth daily. 90 tablet 3   lisinopril-hydrochlorothiazide (ZESTORETIC) 20-12.5 MG tablet Take 1 tablet by mouth daily. 90 tablet 3   naproxen sodium (ALEVE) 220 MG tablet Take 220 mg by mouth 2 (two) times daily as needed.      NYAMYC powder APPLY TOPICALLY THREE TIMES A DAY 15 g 1   polyethylene glycol (MIRALAX / GLYCOLAX) 17 g packet Take 17 g by mouth daily.     risperiDONE (RISPERDAL) 2 MG tablet Take 0.5 tablets (1 mg total) by mouth 2 (two) times daily. 90 tablet 3   traZODone (DESYREL) 100 MG tablet Take 1 tablet  (100 mg total) by mouth at bedtime. 90 tablet 0   Current Facility-Administered Medications  Medication Dose Route Frequency Provider Last Rate Last Admin   lidocaine (PF) (XYLOCAINE) 1 % injection 4 mL  4 mL Intradermal Once Virginia Crews, MD       lidocaine (PF) (XYLOCAINE) 1 % injection  4 mL  4 mL Intradermal Once Bacigalupo, Dionne Bucy, MD         Musculoskeletal: Strength & Muscle Tone: Bailey: UTA Patient leans: N/A  Psychiatric Specialty Exam: Review of Systems  HENT: Positive for hearing loss (reports just got a new hearing aid and is getting adjusted to it).   Neurological: Positive for dizziness (Chronic).  Psychiatric/Behavioral: The patient is nervous/anxious.   All other systems reviewed and are negative.   There were no vitals taken for this visit.There is no height or weight on file to calculate BMI.  General Appearance: UTA  Eye Contact:  UTA  Speech:  Slow  Volume:  Decreased  Mood:  Anxious  Affect:  UTA  Thought Process:  Goal Directed and Descriptions of Associations: Intact  Orientation:  Full (Time, Place, and Person)  Thought Content: Logical   Suicidal Thoughts:  No  Homicidal Thoughts:  No  Memory:  Immediate;   Fair Recent;   Limited Remote;   Limited  Judgement:  Fair  Insight:  Fair  Psychomotor Activity:  UTA  Concentration:  Concentration: Fair and Attention Span: Fair  Recall:  Poor  Fund of Knowledge: Fair  Language: Fair  Akathisia:  No  Handed:  Right  AIMS (if indicated): UTA  Assets:  Housing Intimacy Social Support  ADL's:  Intact  Cognition: Impaired,  Mild  Sleep:  Fair   Screenings: GAD-7   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  Patient is biologically predisposed given her multiple health issues.  Patient with comorbid hearing loss, cognitive changes, Parkinson's disease, is currently stable on current medication regimen.  Plan as noted below.  Plan Bipolar disorder in remission Risperidone 2 mg p.o. daily Prozac 60 mg p.o. daily  GAD-stable Continue CBT with Ms. Christina Hussami Continue Prozac 60 mg p.o. daily  Insomnia-stable Trazodone 50 to 100 mg p.o. nightly as needed Melatonin as needed.  For cognitive disorder-patient to continue to follow-up with her neurologist. Patient appeared to have some trouble hearing during our conversation today however reported that she has a new hearing aid and is getting used to it.  Patient however overall was able to engage in session today and was able to answer questions appropriately, although appeared to have cognitive slowing, patient appeared to be alert oriented in session today.  Patient advised to have her husband present during our next session.  Follow-up in clinic in 4 weeks or sooner if needed.  I have spent atleast 20 minutes non face to face with patient today. More than 50 % of the time was spent for preparing to see the patient ( e.g., review of test, records ), obtaining and to review and separately obtained history , ordering medications and test ,  psychoeducation and supportive psychotherapy and care coordination,as well as documenting  clinical information in electronic health record. This note was generated in part or whole with voice recognition software. Voice recognition is usually quite accurate but there are transcription errors that can and very often do occur. I apologize for any typographical errors that were not detected and corrected.        Ursula Alert, MD 12/12/2020, 2:25 PM

## 2020-12-12 ENCOUNTER — Other Ambulatory Visit: Payer: Self-pay

## 2020-12-12 ENCOUNTER — Ambulatory Visit: Payer: Medicare PPO

## 2020-12-12 DIAGNOSIS — M6281 Muscle weakness (generalized): Secondary | ICD-10-CM

## 2020-12-12 DIAGNOSIS — R262 Difficulty in walking, not elsewhere classified: Secondary | ICD-10-CM | POA: Diagnosis not present

## 2020-12-12 DIAGNOSIS — M25552 Pain in left hip: Secondary | ICD-10-CM | POA: Diagnosis not present

## 2020-12-12 DIAGNOSIS — M25551 Pain in right hip: Secondary | ICD-10-CM | POA: Diagnosis not present

## 2020-12-12 DIAGNOSIS — H903 Sensorineural hearing loss, bilateral: Secondary | ICD-10-CM | POA: Diagnosis not present

## 2020-12-12 DIAGNOSIS — H6122 Impacted cerumen, left ear: Secondary | ICD-10-CM | POA: Diagnosis not present

## 2020-12-12 NOTE — Therapy (Signed)
[facility] [address], [place], [address] Phone: [phone]   Fax:  [phone]  Physical Therapy Treatment  Patient Details  Name: Kendra [name] MRN: [id number] Date of Birth: 1946/03/18 No data recorded  Encounter Date: 12/12/2020   PT End of Session - 12/12/20 1523    Visit Number 57    Number of Visits 84    Date for PT Re-Evaluation 01/01/21    Authorization Type 2/10    PT Start Time 1515    PT Stop Time 1600    PT Time Calculation (min) 45 min    Equipment Utilized During Treatment Gait belt    Activity Tolerance Patient tolerated treatment well;No increased pain    Behavior During Therapy WFL for tasks assessed/performed           Past Medical History:  Diagnosis Date  . Anxiety   . Breast cancer ([place]) 03/2018   right breast cancer at 11:00 and 1:00  . Bursitis    bilateral hips and knees  . Complication of anesthesia   . Depression   . Family history of breast cancer 03/24/2018  . Family history of lung cancer 03/24/2018  . History of alcohol dependence ([place]) 2007   no ETOH; resolved since 2007  . History of hiatal hernia   . History of psychosis 2014   due to sleep disturbance  . Hypertension   . Parkinson disease ([place])   . Personal history of radiation therapy 06/2018-07/2018   right breast ca  . PONV (postoperative nausea and vomiting)     Past Surgical History:  Procedure Laterality Date  . BREAST BIOPSY Right 03/14/2018   11:00 DCIS and invasive ductal carcinoma  . BREAST BIOPSY Right 03/14/2018   1:00 Invasive ductal carcinoma  . BREAST LUMPECTOMY Right 04/27/2018   lumpectomy of 11 and 1:00 cancers, clear margins, negative LN  . BREAST LUMPECTOMY WITH RADIOACTIVE SEED AND SENTINEL LYMPH NODE BIOPSY Right 04/27/2018   Procedure: RIGHT BREAST LUMPECTOMY WITH RADIOACTIVE SEED X 2 AND RIGHT SENTINEL LYMPH NODE BIOPSY;  Surgeon: [name], MD;  Location: [facility];  Service: General;  Laterality: Right;  . CATARACT EXTRACTION W/PHACO Left 08/30/2019   Procedure: CATARACT EXTRACTION PHACO AND INTRAOCULAR LENS PLACEMENT ([facility]) LEFT panoptix toric  01:20.5  12.7%  10.26;  Surgeon: Leandrew Koyanagi, MD;  Location: [facility];  Service: Ophthalmology;  Laterality: Left;  . CATARACT EXTRACTION W/PHACO Right 09/20/2019   Procedure: CATARACT EXTRACTION PHACO AND INTRAOCULAR LENS PLACEMENT ([facility]) RIGHT 5.71  00:36.4  15.7%;  Surgeon: Leandrew Koyanagi, MD;  Location: [facility];  Service: Ophthalmology;  Laterality: Right;  . LAPAROTOMY N/A 02/10/2018   Procedure: EXPLORATORY LAPAROTOMY;  Surgeon: Isabel Caprice, MD;  Location: [facility];  Service: Gynecology;  Laterality: N/A;  . MASS EXCISION  01/2018   abdominal  . OVARIAN CYST REMOVAL    . SALPINGOOPHORECTOMY Bilateral 02/10/2018   Procedure: BILATERAL SALPINGO OOPHORECTOMY; PERITONEAL WASHINGS;  Surgeon: Isabel Caprice, MD;  Location: [facility];  Service: Gynecology;  Laterality: Bilateral;  . TONSILLECTOMY    . TONSILLECTOMY AND ADENOIDECTOMY  1953    There were no vitals filed for this visit.   Subjective Assessment - 12/12/20 1520    Subjective Patient states she is changing two of her medications in terms of dosage and is concerned becuse she does not want it to affect her walking further.    Pertinent History Patient report unclear regarding  onset. Visited MD on Sept 16 and received injection with relief for approximately 1 month. Reports she cannot receive an additional injection pending eye surgery. Received dx from [facility] of hip bursitis in B hips. Reports pain with walking, stanindg, sitting 5/10 current 7/10 worst 3/10 best, with temporary relief when she is sitting "just right" and with naproxen. Agg: activity. Cormorbidities: hx of breast cancer, HTN, MCI, insomnia, cataracts    Limitations Sitting;Standing;Walking;House hold activities    How long can you sit  comfortably? Pain constant, reduced with neutral pelvic tilt    How long can you stand comfortably? Pain constant, increases with time    How long can you walk comfortably? Pain constant, increases with time    Patient Stated Goals Be able to move without hurting, walk 1 hour    Currently in Pain? No/denies               Therapeutic Exercise  Seated reach outs, and down, out and back -x 15 Wall spinning along wall for vestibular challenge -x 10 Karaoke walking- 2x74f Side stepping around in a circle touching colored cones- 579m Hurdle walking with foot tap on cones- 3x6 hurdles Backwards walking with ball finding -2 x 3322fPerformed exercises to improve dizziness symptoms    PT Education - 12/12/20 1523    Education provided Yes    Education Details form/technique with exercise    Person(s) Educated Patient    Methods Explanation;Demonstration    Comprehension Returned demonstration;Verbalized understanding            PT Short Term Goals - 09/12/19 1419      PT SHORT TERM GOAL #1   Title Patient will demonstrate complaince with HEP to speed recovery and reduce total number of visits.    Baseline HEP given    Time 2    Period Weeks    Status New    Target Date 09/27/19             PT Long Term Goals - 11/05/20 1318      PT LONG TERM GOAL #1   Title Patient will improve hip strength to 4/5 grossly to facilitate improved mobility with ADLs.    Baseline 3, 4-; 12/22 deferred to NV; 11/09/2019: 4-/5; 12/07/2019 L hip flexor 4, abd and ext 3+; 01/29/2020: 4/5 for most hip motions, 3+/5 for abd, ext; 03/20/2020: 4/5 for hip movements    Time 8    Period Weeks    Status Achieved      PT LONG TERM GOAL #2   Title Patient will demonstrate walking tolerance increased to 30 minutes without increase in symptoms to demonstrate improved ability to perform walking program for cardiovascular health.    Baseline 10 minute tolerance (patient report); 12/22 10 minutes (per  pt report); 11/09/2019: 7 min; 12/07/2019 unable to assess due to icy streets;  01/29/2020: 10 min; 03/20/2020 10 min; 05/14/2020: 10 min x 3 per day; 06/11/2020: 51m95m 3 per day; 07/15/2020: 51mi69m3 per day; 09/02/2020: 51min70m (just standing rest breaks in between); 11/06/2019: 10 min x 3    Time 8    Period Weeks    Status Partially Met      PT LONG TERM GOAL #3   Title Patient will report worst pain of 3/10 during therapy to demonstrate reduced disability    Baseline Worst 7/10; 12/22 worst 9/10; 11/09/2019: 9/10; 12/07/2019 7/10; 01/29/2020: 7/10; 05/14/2020: 0/ 10    Time 8  Period Weeks    Status Achieved      PT LONG TERM GOAL #4   Title Patient will demonstrate ability to perform all bed mobility maneuvers without increase in pain for reduced disability with mobility and transfers with ADLs.    Baseline Reports pain with bed mobility supine <> sidelying L/R, sidelying to sitting EOB; 12/22 pt able to perform I without increased pain    Time 8    Period Weeks    Status Achieved      PT LONG TERM GOAL #5   Title Patient will improve 10MW speed to 1.0 m/sec to meet norms for minimal fall risk and full community ambulation.    Baseline 0.88 m/sec; 12/22 0.96 m/sec; 11/09/2019: deferred; 12/07/2019 0.90 m/sec; 01/29/2020: .9  m/sec; 03/20/2020: .12ms; 05/14/2020: . 9 m/s; 06/11/2020: .957m; 07/15/2020: 1 m/s    Time 8    Period Weeks    Status Achieved      Additional Long Term Goals   Additional Long Term Goals Yes      PT LONG TERM GOAL #6   Title Patient will increase 6MWT by 200 ft to demonstrate improvement in functional capacity for transition to cardiac walking program    Baseline 6MWT = 1195; 1/7/20201: deferred; 12/07/2019 1095; 01/29/2020: deferred secondary to fatigue; 03/20/2020: 107013f9/13/2021: [id number]/12/2019: 800ft12f4/2022: 973ft 46fime 8    Period Weeks    Status On-going      PT LONG TERM GOAL #7   Title Patient will improve her 5xsts to under 15 sec to indicate singificnat  improvement in LE functional strength and decrease in fall risk.    Baseline 24.5 sec; 03/20/2020: 19.19 sec; 06/13/2020: 14. 92    Time 6    Period Weeks    Status Achieved      PT LONG TERM GOAL #8   Title Patient will improve FGA to 28/30 to indicate improvement in dynamic balance and improvement in stability.    Baseline 23/30; 06/11/2020: 25/30; 07/15/2020: 26/30; 09/02/2020: 27/30; 11/05/2020: 28/30    Time 6    Period Weeks    Status Achieved      PT LONG TERM GOAL  #9   TITLE Patient will be able to ascend/desend the stairs without use of UEs to increase safety and decrease fall risk with stair use.    Baseline requires use of UEs    Time 6    Period Weeks    Status New    Target Date 12/31/20                 Plan - 12/12/20 1610    Clinical Impression Statement Patient able to tolerate greater amount of backward ambulation today, however continues to have LOB posteriorly after stopping motion. Patient is progressing well overall, however continues to experience dizziness which limits ability to perform standing exercises and prolonged amb. Patient wil lbenefit from further skiled therapy to improve functional capacity.    Personal Factors and Comorbidities Age;Comorbidity 3+;Time since onset of injury/illness/exacerbation;Comorbidity 2;Other    Comorbidities MCI, HTN, CA, cataracts    Examination-Activity Limitations Bend;Sit;Squat;Stairs;Stand;Sleep    Examination-Participation Restrictions Cleaning;Community Activity    Stability/Clinical Decision Making Evolving/Moderate complexity    Rehab Potential Fair    Clinical Impairments Affecting Rehab Potential MCI    PT Frequency 2x / week    PT Duration 6 weeks    PT Treatment/Interventions ADLs/Self Care Home Management;Therapeutic exercise;Patient/family education;Neuromuscular re-education;Therapeutic activities;Functional mobility training;Balance training;Manual techniques;Gait training;Stair training;Moist  Heat;Electrical Stimulation;Cryotherapy;Taping;Dry needling;Energy conservation;Passive range of motion;Joint Manipulations    PT Next Visit Plan Progress glute therex, pelvic-hip dissociation; check and progress HEP with stretches    PT Home Exercise Plan lateral step ups, step ups, hip kickers ext/abd red theraband    Consulted and Agree with Plan of Care Patient           Patient will benefit from skilled therapeutic intervention in order to improve the following deficits and impairments:  Decreased range of motion,Postural dysfunction,Pain,Abnormal gait,Decreased activity tolerance,Decreased coordination,Decreased endurance,Decreased balance,Decreased mobility,Difficulty walking,Hypomobility,Increased muscle spasms,Impaired perceived functional ability,Decreased strength,Impaired flexibility  Visit Diagnosis: Muscle weakness (generalized)  Difficulty in walking, not elsewhere classified     Problem List Patient Active Problem List   Diagnosis Date Noted  . Osteopenia of neck of right femur 09/06/2020  . Class 1 obesity with serious comorbidity and body mass index (BMI) of 32.0 to 32.9 in adult 08/29/2020  . Candidal skin infection 04/29/2020  . Parkinson disease ([place]) 02/22/2020  . 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 Bipolar disorder, in full remission, most recent episode depressed ([place]) 11/08/2019  . Avitaminosis D 08/24/2019  . Insomnia due to mental condition 05/31/2019  . Bipolar I disorder, most recent episode depressed ([place]) 05/31/2019  . Cognitive disorder 05/31/2019  . Alcohol use disorder, moderate, in sustained remission ([place]) 05/31/2019  . Elevated tumor markers 05/27/2019  . GAD (generalized anxiety disorder) 04/10/2019  . MDD (major depressive disorder), severe ([place]) 01/31/2019  . High serum carbohydrate antigen 19-9 (CA19-9) 12/01/2018  . Adjustment disorder with anxious mood 11/24/2018  . Essential hypertension 11/24/2018  . B12 deficiency 10/17/2018  .  Incisional hernia, without obstruction or gangrene 09/12/2018  . Genetic testing 04/07/2018  . Malignant neoplasm of upper-outer quadrant of right breast in female, estrogen receptor positive ([place]) 03/17/2018  . Malignant neoplasm of upper-inner quadrant of right breast in female, estrogen receptor positive ([place]) 03/17/2018  . History of psychosis 02/07/2018  . History of insomnia 02/07/2018    Blythe Stanford, PT DPT 12/12/2020, 4:24 PM  [facility] [address] [place], [address] Phone: [phone]   Fax:  [phone]  Name: Kendra [name] MRN: [id number] Date of Birth: 1946/02/17

## 2020-12-16 ENCOUNTER — Other Ambulatory Visit: Payer: Self-pay

## 2020-12-16 ENCOUNTER — Ambulatory Visit: Payer: Medicare PPO

## 2020-12-16 DIAGNOSIS — M6281 Muscle weakness (generalized): Secondary | ICD-10-CM | POA: Diagnosis not present

## 2020-12-16 DIAGNOSIS — R262 Difficulty in walking, not elsewhere classified: Secondary | ICD-10-CM | POA: Diagnosis not present

## 2020-12-16 DIAGNOSIS — M25552 Pain in left hip: Secondary | ICD-10-CM

## 2020-12-16 DIAGNOSIS — M25551 Pain in right hip: Secondary | ICD-10-CM | POA: Diagnosis not present

## 2020-12-16 NOTE — Therapy (Signed)
[facility] [address] [place], [address] Phone: [phone]   Fax:  [phone]  Physical Therapy Treatment  Patient Details  Name: Kendra [name] MRN: [id number] Date of Birth: 06-28-1946 No data recorded  Encounter Date: 12/16/2020   PT End of Session - 12/16/20 0929    Visit Number 25    Number of Visits 84    Date for PT Re-Evaluation 01/01/21    Authorization Type 2/10    PT Start Time 0900    PT Stop Time 0945    PT Time Calculation (min) 45 min    Equipment Utilized During Treatment Gait belt    Activity Tolerance Patient tolerated treatment well;No increased pain    Behavior During Therapy WFL for tasks assessed/performed           Past Medical History:  Diagnosis Date  . Anxiety   . Breast cancer ([place]) 03/2018   right breast cancer at 11:00 and 1:00  . Bursitis    bilateral hips and knees  . Complication of anesthesia   . Depression   . Family history of breast cancer 03/24/2018  . Family history of lung cancer 03/24/2018  . History of alcohol dependence ([place]) 2007   no ETOH; resolved since 2007  . History of hiatal hernia   . History of psychosis 2014   due to sleep disturbance  . Hypertension   . Parkinson disease ([place])   . Personal history of radiation therapy 06/2018-07/2018   right breast ca  . PONV (postoperative nausea and vomiting)     Past Surgical History:  Procedure Laterality Date  . BREAST BIOPSY Right 03/14/2018   11:00 DCIS and invasive ductal carcinoma  . BREAST BIOPSY Right 03/14/2018   1:00 Invasive ductal carcinoma  . BREAST LUMPECTOMY Right 04/27/2018   lumpectomy of 11 and 1:00 cancers, clear margins, negative LN  . BREAST LUMPECTOMY WITH RADIOACTIVE SEED AND SENTINEL LYMPH NODE BIOPSY Right 04/27/2018   Procedure: RIGHT BREAST LUMPECTOMY WITH RADIOACTIVE SEED X 2 AND RIGHT SENTINEL LYMPH NODE BIOPSY;  Surgeon: [name], MD;  Location: [facility];  Service: General;  Laterality: Right;  . CATARACT EXTRACTION W/PHACO Left 08/30/2019   Procedure: CATARACT EXTRACTION PHACO AND INTRAOCULAR LENS PLACEMENT ([place]) LEFT panoptix toric  01:20.5  12.7%  10.26;  Surgeon: Leandrew Koyanagi, MD;  Location: [facility];  Service: Ophthalmology;  Laterality: Left;  . CATARACT EXTRACTION W/PHACO Right 09/20/2019   Procedure: CATARACT EXTRACTION PHACO AND INTRAOCULAR LENS PLACEMENT ([facility]) RIGHT 5.71  00:36.4  15.7%;  Surgeon: Leandrew Koyanagi, MD;  Location: [facility];  Service: Ophthalmology;  Laterality: Right;  . LAPAROTOMY N/A 02/10/2018   Procedure: EXPLORATORY LAPAROTOMY;  Surgeon: Isabel Caprice, MD;  Location: [facility];  Service: Gynecology;  Laterality: N/A;  . MASS EXCISION  01/2018   abdominal  . OVARIAN CYST REMOVAL    . SALPINGOOPHORECTOMY Bilateral 02/10/2018   Procedure: BILATERAL SALPINGO OOPHORECTOMY; PERITONEAL WASHINGS;  Surgeon: Isabel Caprice, MD;  Location: [facility];  Service: Gynecology;  Laterality: Bilateral;  . TONSILLECTOMY    . TONSILLECTOMY AND ADENOIDECTOMY  1953    There were no vitals filed for this visit.   Subjective Assessment - 12/16/20 0920    Subjective Patient reports she had increased dizziness in the am. Patient reports her dizzness is not severe currently.    Pertinent History Patient report unclear regarding onset. Visited MD on Sept 16 and received injection with  relief for approximately 1 month. Reports she cannot receive an additional injection pending eye surgery. Received dx from MD of hip bursitis in B hips. Reports pain with walking, stanindg, sitting 5/10 current 7/10 worst 3/10 best, with temporary relief when she is sitting "just right" and with naproxen. Agg: activity. Cormorbidities: hx of breast cancer, HTN, MCI, insomnia, cataracts    Limitations Sitting;Standing;Walking;House hold activities    How long can you sit comfortably? Pain constant, reduced with  neutral pelvic tilt    How long can you stand comfortably? Pain constant, increases with time    How long can you walk comfortably? Pain constant, increases with time    Patient Stated Goals Be able to move without hurting, walk 1 hour    Currently in Pain? No/denies                   TREATMENT Therapeutic Exercise Step ups onto 6" - 2 x 15  Backwards walking with ball toss - 2 x 42ft Soccer kicks in standing - 5 min in standing Wall rolls in standing - x 7 Cone weaving in standing - x 10 B directions Walking forward cone finding with head turns - 2 x 19ft  Performing exercises to address pain and spasms      PT Education - 12/16/20 0928    Education provided Yes    Education Details form/technique with exercise    Person(s) Educated Patient    Methods Explanation;Demonstration    Comprehension Verbalized understanding;Returned demonstration            PT Short Term Goals - 09/12/19 1419      PT SHORT TERM GOAL #1   Title Patient will demonstrate complaince with HEP to speed recovery and reduce total number of visits.    Baseline HEP given    Time 2    Period Weeks    Status New    Target Date 09/27/19             PT Long Term Goals - 11/05/20 1318      PT LONG TERM GOAL #1   Title Patient will improve hip strength to 4/5 grossly to facilitate improved mobility with ADLs.    Baseline 3, 4-; 12/22 deferred to NV; 11/09/2019: 4-/5; 12/07/2019 L hip flexor 4, abd and ext 3+; 01/29/2020: 4/5 for most hip motions, 3+/5 for abd, ext; 03/20/2020: 4/5 for hip movements    Time 8    Period Weeks    Status Achieved      PT LONG TERM GOAL #2   Title Patient will demonstrate walking tolerance increased to 30 minutes without increase in symptoms to demonstrate improved ability to perform walking program for cardiovascular health.    Baseline 10 minute tolerance (patient report); 12/22 10 minutes (per pt report); 11/09/2019: 7 min; 12/07/2019 unable to assess due to icy  streets;  01/29/2020: 10 min; 03/20/2020 10 min; 05/14/2020: 10 min x 3 per day; 06/11/2020: 33min x 3 per day; 07/15/2020: 104min x 3 per day; 09/02/2020: 33min x 3 (just standing rest breaks in between); 11/06/2019: 10 min x 3    Time 8    Period Weeks    Status Partially Met      PT LONG TERM GOAL #3   Title Patient will report worst pain of 3/10 during therapy to demonstrate reduced disability    Baseline Worst 7/10; 12/22 worst 9/10; 11/09/2019: 9/10; 12/07/2019 7/10; 01/29/2020: 7/10; 05/14/2020: 0/ 10    Time 8  Period Weeks    Status Achieved      PT LONG TERM GOAL #4   Title Patient will demonstrate ability to perform all bed mobility maneuvers without increase in pain for reduced disability with mobility and transfers with ADLs.    Baseline Reports pain with bed mobility supine <> sidelying L/R, sidelying to sitting EOB; 12/22 pt able to perform I without increased pain    Time 8    Period Weeks    Status Achieved      PT LONG TERM GOAL #5   Title Patient will improve 10MW speed to 1.0 m/sec to meet norms for minimal fall risk and full community ambulation.    Baseline 0.88 m/sec; 12/22 0.96 m/sec; 11/09/2019: deferred; 12/07/2019 0.90 m/sec; 01/29/2020: .9  m/sec; 03/20/2020: .51m/s; 05/14/2020: . 9 m/s; 06/11/2020: .25m/s; 07/15/2020: 1 m/s    Time 8    Period Weeks    Status Achieved      Additional Long Term Goals   Additional Long Term Goals Yes      PT LONG TERM GOAL #6   Title Patient will increase 6MWT by 200 ft to demonstrate improvement in functional capacity for transition to cardiac walking program    Baseline 6MWT = 1195; 1/7/20201: deferred; 12/07/2019 1095; 01/29/2020: deferred secondary to fatigue; 03/20/2020: 1072ft; 07/15/2020: 1140ft; 09/03/2020: 872ft; 11/05/2020: 934ft    Time 8    Period Weeks    Status On-going      PT LONG TERM GOAL #7   Title Patient will improve her 5xsts to under 15 sec to indicate singificnat improvement in LE functional strength and decrease in fall risk.     Baseline 24.5 sec; 03/20/2020: 19.19 sec; 06/13/2020: 14. 92    Time 6    Period Weeks    Status Achieved      PT LONG TERM GOAL #8   Title Patient will improve FGA to 28/30 to indicate improvement in dynamic balance and improvement in stability.    Baseline 23/30; 06/11/2020: 25/30; 07/15/2020: 26/30; 09/02/2020: 27/30; 11/05/2020: 28/30    Time 6    Period Weeks    Status Achieved      PT LONG TERM GOAL  #9   TITLE Patient will be able to ascend/desend the stairs without use of UEs to increase safety and decrease fall risk with stair use.    Baseline requires use of UEs    Time 6    Period Weeks    Status New    Target Date 12/31/20                 Plan - 12/16/20 0949    Clinical Impression Statement Patient performed exercises focused on improving balance in standing and walking. Patient with one LOB with requiring total Assist to maintain standing. Patient's safety awareness is poor when tired and fatigued as evidenced by increased LOB with performing step ups and prolonged walking. Patient will benefit from further skilled therapy.    Personal Factors and Comorbidities Age;Comorbidity 3+;Time since onset of injury/illness/exacerbation;Comorbidity 2;Other    Comorbidities MCI, HTN, CA, cataracts    Examination-Activity Limitations Bend;Sit;Squat;Stairs;Stand;Sleep    Examination-Participation Restrictions Cleaning;Community Activity    Stability/Clinical Decision Making Evolving/Moderate complexity    Rehab Potential Fair    Clinical Impairments Affecting Rehab Potential MCI    PT Frequency 2x / week    PT Duration 6 weeks    PT Treatment/Interventions ADLs/Self Care Home Management;Therapeutic exercise;Patient/family education;Neuromuscular re-education;Therapeutic activities;Functional mobility training;Balance training;Manual techniques;Gait training;Stair training;Moist  Heat;Electrical Stimulation;Cryotherapy;Taping;Dry needling;Energy conservation;Passive range of  motion;Joint Manipulations    PT Next Visit Plan Progress glute therex, pelvic-hip dissociation; check and progress HEP with stretches    PT Home Exercise Plan lateral step ups, step ups, hip kickers ext/abd red theraband    Consulted and Agree with Plan of Care Patient           Patient will benefit from skilled therapeutic intervention in order to improve the following deficits and impairments:  Decreased range of motion,Postural dysfunction,Pain,Abnormal gait,Decreased activity tolerance,Decreased coordination,Decreased endurance,Decreased balance,Decreased mobility,Difficulty walking,Hypomobility,Increased muscle spasms,Impaired perceived functional ability,Decreased strength,Impaired flexibility  Visit Diagnosis: Muscle weakness (generalized)  Difficulty in walking, not elsewhere classified  Pain in left hip     Problem List Patient Active Problem List   Diagnosis Date Noted  . Osteopenia of neck of right femur 09/06/2020  . Class 1 obesity with serious comorbidity and body mass index (BMI) of 32.0 to 32.9 in adult 08/29/2020  . Candidal skin infection 04/29/2020  . Parkinson disease ([place]) 02/22/2020  . 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 Bipolar disorder, in full remission, most recent episode depressed ([place]) 11/08/2019  . Avitaminosis D 08/24/2019  . Insomnia due to mental condition 05/31/2019  . Bipolar I disorder, most recent episode depressed ([place]) 05/31/2019  . Cognitive disorder 05/31/2019  . Alcohol use disorder, moderate, in sustained remission ([place]) 05/31/2019  . Elevated tumor markers 05/27/2019  . GAD (generalized anxiety disorder) 04/10/2019  . MDD (major depressive disorder), severe ([place]) 01/31/2019  . High serum carbohydrate antigen 19-9 (CA19-9) 12/01/2018  . Adjustment disorder with anxious mood 11/24/2018  . Essential hypertension 11/24/2018  . B12 deficiency 10/17/2018  . Incisional hernia, without obstruction or gangrene 09/12/2018  . Genetic testing  04/07/2018  . Malignant neoplasm of upper-outer quadrant of right breast in female, estrogen receptor positive ([place]) 03/17/2018  . Malignant neoplasm of upper-inner quadrant of right breast in female, estrogen receptor positive ([place]) 03/17/2018  . History of psychosis 02/07/2018  . History of insomnia 02/07/2018    Blythe Stanford, PT DPT 12/16/2020, 10:03 AM  [facility] [address], [place], [address] Phone: [phone]   Fax:  [phone]  Name: Kendra [name] MRN: [id number] Date of Birth: 1946-03-09

## 2020-12-18 ENCOUNTER — Other Ambulatory Visit: Payer: Self-pay

## 2020-12-18 ENCOUNTER — Ambulatory Visit: Payer: Medicare PPO

## 2020-12-18 DIAGNOSIS — M6281 Muscle weakness (generalized): Secondary | ICD-10-CM

## 2020-12-18 DIAGNOSIS — M25552 Pain in left hip: Secondary | ICD-10-CM | POA: Diagnosis not present

## 2020-12-18 DIAGNOSIS — R262 Difficulty in walking, not elsewhere classified: Secondary | ICD-10-CM | POA: Diagnosis not present

## 2020-12-18 DIAGNOSIS — M25551 Pain in right hip: Secondary | ICD-10-CM | POA: Diagnosis not present

## 2020-12-18 NOTE — Therapy (Signed)
[facility] [address], [place], [address] Phone: [phone]   Fax:  [phone]  Physical Therapy Treatment  Patient Details  Name: Kendra [name] MRN: [id number] Date of Birth: 08/24/1946 No data recorded  Encounter Date: 12/18/2020   PT End of Session - 12/18/20 1139    Visit Number 41    Number of Visits 84    Date for PT Re-Evaluation 01/01/21    Authorization Type 2/10    PT Start Time 0945    PT Stop Time 1030    PT Time Calculation (min) 45 min    Equipment Utilized During Treatment Gait belt    Activity Tolerance Patient tolerated treatment well;No increased pain    Behavior During Therapy WFL for tasks assessed/performed           Past Medical History:  Diagnosis Date  . Anxiety   . Breast cancer ([place]) 03/2018   right breast cancer at 11:00 and 1:00  . Bursitis    bilateral hips and knees  . Complication of anesthesia   . Depression   . Family history of breast cancer 03/24/2018  . Family history of lung cancer 03/24/2018  . History of alcohol dependence ([place]) 2007   no ETOH; resolved since 2007  . History of hiatal hernia   . History of psychosis 2014   due to sleep disturbance  . Hypertension   . Parkinson disease ([place])   . Personal history of radiation therapy 06/2018-07/2018   right breast ca  . PONV (postoperative nausea and vomiting)     Past Surgical History:  Procedure Laterality Date  . BREAST BIOPSY Right 03/14/2018   11:00 DCIS and invasive ductal carcinoma  . BREAST BIOPSY Right 03/14/2018   1:00 Invasive ductal carcinoma  . BREAST LUMPECTOMY Right 04/27/2018   lumpectomy of 11 and 1:00 cancers, clear margins, negative LN  . BREAST LUMPECTOMY WITH RADIOACTIVE SEED AND SENTINEL LYMPH NODE BIOPSY Right 04/27/2018   Procedure: RIGHT BREAST LUMPECTOMY WITH RADIOACTIVE SEED X 2 AND RIGHT SENTINEL LYMPH NODE BIOPSY;  Surgeon: [name], MD;  Location: [facility];  Service: General;  Laterality: Right;  . CATARACT EXTRACTION W/PHACO Left 08/30/2019   Procedure: CATARACT EXTRACTION PHACO AND INTRAOCULAR LENS PLACEMENT ([place]) LEFT panoptix toric  01:20.5  12.7%  10.26;  Surgeon: Leandrew Koyanagi, MD;  Location: [facility];  Service: Ophthalmology;  Laterality: Left;  . CATARACT EXTRACTION W/PHACO Right 09/20/2019   Procedure: CATARACT EXTRACTION PHACO AND INTRAOCULAR LENS PLACEMENT ([facility]) RIGHT 5.71  00:36.4  15.7%;  Surgeon: Leandrew Koyanagi, MD;  Location: [facility];  Service: Ophthalmology;  Laterality: Right;  . LAPAROTOMY N/A 02/10/2018   Procedure: EXPLORATORY LAPAROTOMY;  Surgeon: Isabel Caprice, MD;  Location: [facility];  Service: Gynecology;  Laterality: N/A;  . MASS EXCISION  01/2018   abdominal  . OVARIAN CYST REMOVAL    . SALPINGOOPHORECTOMY Bilateral 02/10/2018   Procedure: BILATERAL SALPINGO OOPHORECTOMY; PERITONEAL WASHINGS;  Surgeon: Isabel Caprice, MD;  Location: [facility];  Service: Gynecology;  Laterality: Bilateral;  . TONSILLECTOMY    . TONSILLECTOMY AND ADENOIDECTOMY  1953    There were no vitals filed for this visit.   Subjective Assessment - 12/18/20 0950    Subjective Patient reports she is feeling a little "foggy" headed this morning upon arrival.  She is usually a little slower at moving around in the morning.    Pertinent History Patient report unclear regarding onset.  Visited MD on Sept 16 and received injection with relief for approximately 1 month. Reports she cannot receive an additional injection pending eye surgery. Received dx from MD of hip bursitis in B hips. Reports pain with walking, stanindg, sitting 5/10 current 7/10 worst 3/10 best, with temporary relief when she is sitting "just right" and with naproxen. Agg: activity. Cormorbidities: hx of breast cancer, HTN, MCI, insomnia, cataracts    Limitations Sitting;Standing;Walking;House hold activities    How long can you sit  comfortably? Pain constant, reduced with neutral pelvic tilt    How long can you stand comfortably? Pain constant, increases with time    How long can you walk comfortably? Pain constant, increases with time    Patient Stated Goals Be able to move without hurting, walk 1 hour            Treatment Today:  Seated reach forward, down, up, behind x10  STS 5x, 2 sets (from standard height chair) Walking 5 laps (500 ft)- focused on turning head R/L and up/down, PT CGA with gait belt Wall Spinning x 10 R and x10 L Reverse walking 20 ft x 4 with turn to R/L at end of 20 ft Walking forward reaching for cone 20 ft x 4 with turn R/L at end of 20 ft Karaoke walking 2x20 ft Hurdle walking in a circular pattern R and L (5 loops each direction) Forward walking reaching for cone R/L with 180 deg turns at end of 30 ft, repeat 8x         PT Education - 12/18/20 1139    Education provided Yes    Education Details form, exercise technique            PT Short Term Goals - 09/12/19 1419      PT SHORT TERM GOAL #1   Title Patient will demonstrate complaince with HEP to speed recovery and reduce total number of visits.    Baseline HEP given    Time 2    Period Weeks    Status New    Target Date 09/27/19             PT Long Term Goals - 12/18/20 1140      PT LONG TERM GOAL #1   Title Patient will improve hip strength to 4/5 grossly to facilitate improved mobility with ADLs.    Baseline 3, 4-; 12/22 deferred to NV; 11/09/2019: 4-/5; 12/07/2019 L hip flexor 4, abd and ext 3+; 01/29/2020: 4/5 for most hip motions, 3+/5 for abd, ext; 03/20/2020: 4/5 for hip movements    Time 8    Period Weeks    Status Achieved      PT LONG TERM GOAL #2   Title Patient will demonstrate walking tolerance increased to 30 minutes without increase in symptoms to demonstrate improved ability to perform walking program for cardiovascular health.    Baseline 10 minute tolerance (patient report); 12/22 10 minutes  (per pt report); 11/09/2019: 7 min; 12/07/2019 unable to assess due to icy streets;  01/29/2020: 10 min; 03/20/2020 10 min; 05/14/2020: 10 min x 3 per day; 06/11/2020: 74min x 3 per day; 07/15/2020: 52min x 3 per day; 09/02/2020: 59min x 3 (just standing rest breaks in between); 11/06/2019: 10 min x 3    Time 8    Period Weeks    Status Partially Met      PT LONG TERM GOAL #3   Title Patient will report worst pain of 3/10 during therapy to demonstrate reduced  disability    Baseline Worst 7/10; 12/22 worst 9/10; 11/09/2019: 9/10; 12/07/2019 7/10; 01/29/2020: 7/10; 05/14/2020: 0/ 10    Time 8    Period Weeks    Status Achieved      PT LONG TERM GOAL #4   Title Patient will demonstrate ability to perform all bed mobility maneuvers without increase in pain for reduced disability with mobility and transfers with ADLs.    Baseline Reports pain with bed mobility supine <> sidelying L/R, sidelying to sitting EOB; 12/22 pt able to perform I without increased pain    Time 8    Period Weeks    Status Achieved      PT LONG TERM GOAL #5   Title Patient will improve 10MW speed to 1.0 m/sec to meet norms for minimal fall risk and full community ambulation.    Baseline 0.88 m/sec; 12/22 0.96 m/sec; 11/09/2019: deferred; 12/07/2019 0.90 m/sec; 01/29/2020: .9  m/sec; 03/20/2020: .54m/s; 05/14/2020: . 9 m/s; 06/11/2020: .19m/s; 07/15/2020: 1 m/s    Time 8    Period Weeks    Status Achieved      PT LONG TERM GOAL #6   Title Patient will increase 6MWT by 200 ft to demonstrate improvement in functional capacity for transition to cardiac walking program    Baseline 6MWT = 1195; 1/7/20201: deferred; 12/07/2019 1095; 01/29/2020: deferred secondary to fatigue; 03/20/2020: 1045ft; 07/15/2020: 1174ft; 09/03/2020: 889ft; 11/05/2020: 980ft    Time 8    Period Weeks    Status On-going      PT LONG TERM GOAL #7   Title Patient will improve her 5xsts to under 15 sec to indicate singificnat improvement in LE functional strength and decrease in fall  risk.    Baseline 24.5 sec; 03/20/2020: 19.19 sec; 06/13/2020: 14. 92    Time 6    Period Weeks    Status Achieved      PT LONG TERM GOAL #8   Title Patient will improve FGA to 28/30 to indicate improvement in dynamic balance and improvement in stability.    Baseline 23/30; 06/11/2020: 25/30; 07/15/2020: 26/30; 09/02/2020: 27/30; 11/05/2020: 28/30    Time 6    Period Weeks    Status Achieved      PT LONG TERM GOAL  #9   TITLE Patient will be able to ascend/desend the stairs without use of UEs to increase safety and decrease fall risk with stair use.    Baseline requires use of UEs    Time 6    Period Weeks    Status New                 Plan - 12/18/20 1141    Clinical Impression Statement Pt has difficulty maintainin balance during dual tasking activities today while ambulating; no LOB requiring total assist; pt able to recover balance 75% of time and PT CGA 25% of time.  Multiple losses of balance noted when turning to the L today.  Overall, she should continue to benefit from skilled PT to improve balance and safety during ambulation.    Personal Factors and Comorbidities Age;Comorbidity 3+;Time since onset of injury/illness/exacerbation;Comorbidity 2;Other    Comorbidities MCI, HTN, CA, cataracts    Examination-Activity Limitations Bend;Sit;Squat;Stairs;Stand;Sleep    Examination-Participation Restrictions Cleaning;Community Activity    Stability/Clinical Decision Making Evolving/Moderate complexity    Rehab Potential Fair    Clinical Impairments Affecting Rehab Potential MCI    PT Frequency 2x / week    PT Duration 6 weeks    PT  Treatment/Interventions ADLs/Self Care Home Management;Therapeutic exercise;Patient/family education;Neuromuscular re-education;Therapeutic activities;Functional mobility training;Balance training;Manual techniques;Gait training;Stair training;Moist Heat;Electrical Stimulation;Cryotherapy;Taping;Dry needling;Energy conservation;Passive range of  motion;Joint Manipulations    PT Next Visit Plan Progress glute therex, pelvic-hip dissociation; check and progress HEP with stretches    PT Home Exercise Plan lateral step ups, step ups, hip kickers ext/abd red theraband    Consulted and Agree with Plan of Care Patient           Patient will benefit from skilled therapeutic intervention in order to improve the following deficits and impairments:  Decreased range of motion,Postural dysfunction,Pain,Abnormal gait,Decreased activity tolerance,Decreased coordination,Decreased endurance,Decreased balance,Decreased mobility,Difficulty walking,Hypomobility,Increased muscle spasms,Impaired perceived functional ability,Decreased strength,Impaired flexibility  Visit Diagnosis: Muscle weakness (generalized)  Difficulty in walking, not elsewhere classified     Problem List Patient Active Problem List   Diagnosis Date Noted  . Osteopenia of neck of right femur 09/06/2020  . Class 1 obesity with serious comorbidity and body mass index (BMI) of 32.0 to 32.9 in adult 08/29/2020  . Candidal skin infection 04/29/2020  . Parkinson disease ([place]) 02/22/2020  . 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 Bipolar disorder, in full remission, most recent episode depressed ([place]) 11/08/2019  . Avitaminosis D 08/24/2019  . Insomnia due to mental condition 05/31/2019  . Bipolar I disorder, most recent episode depressed ([place]) 05/31/2019  . Cognitive disorder 05/31/2019  . Alcohol use disorder, moderate, in sustained remission ([place]) 05/31/2019  . Elevated tumor markers 05/27/2019  . GAD (generalized anxiety disorder) 04/10/2019  . MDD (major depressive disorder), severe ([place]) 01/31/2019  . High serum carbohydrate antigen 19-9 (CA19-9) 12/01/2018  . Adjustment disorder with anxious mood 11/24/2018  . Essential hypertension 11/24/2018  . B12 deficiency 10/17/2018  . Incisional hernia, without obstruction or gangrene 09/12/2018  . Genetic testing 04/07/2018  .  Malignant neoplasm of upper-outer quadrant of right breast in female, estrogen receptor positive ([place]) 03/17/2018  . Malignant neoplasm of upper-inner quadrant of right breast in female, estrogen receptor positive ([place]) 03/17/2018  . History of psychosis 02/07/2018  . History of insomnia 02/07/2018    Pincus Badder 12/18/2020, 11:46 AM Merdis Delay, PT, DPT 0   [facility] [address], [place], [address] Phone: [phone]   Fax:  [phone]  Name: Kendra [name] MRN: [id number] Date of Birth: 08-05-1946

## 2020-12-24 ENCOUNTER — Other Ambulatory Visit: Payer: Self-pay

## 2020-12-24 ENCOUNTER — Ambulatory Visit: Payer: Medicare PPO

## 2020-12-24 DIAGNOSIS — M25551 Pain in right hip: Secondary | ICD-10-CM

## 2020-12-24 DIAGNOSIS — M6281 Muscle weakness (generalized): Secondary | ICD-10-CM | POA: Diagnosis not present

## 2020-12-24 DIAGNOSIS — M25552 Pain in left hip: Secondary | ICD-10-CM

## 2020-12-24 DIAGNOSIS — R262 Difficulty in walking, not elsewhere classified: Secondary | ICD-10-CM

## 2020-12-25 NOTE — Therapy (Signed)
[facility] [address] [place], [address] Phone: [phone]   Fax:  [phone]  Physical Therapy Treatment  Patient Details  Name: Kendra [name] MRN: [id number] Date of Birth: 25-May-1946 No data recorded  Encounter Date: 12/24/2020   PT End of Session - 12/24/20 1313    Visit Number 82    Number of Visits 70    Date for PT Re-Evaluation 01/01/21    Authorization Type 5/10    PT Start Time 1300    PT Stop Time 1345    PT Time Calculation (min) 45 min    Equipment Utilized During Treatment Gait belt    Activity Tolerance Patient tolerated treatment well;No increased pain    Behavior During Therapy WFL for tasks assessed/performed           Past Medical History:  Diagnosis Date  . Anxiety   . Breast cancer ([place]) 03/2018   right breast cancer at 11:00 and 1:00  . Bursitis    bilateral hips and knees  . Complication of anesthesia   . Depression   . Family history of breast cancer 03/24/2018  . Family history of lung cancer 03/24/2018  . History of alcohol dependence ([place]) 2007   no ETOH; resolved since 2007  . History of hiatal hernia   . History of psychosis 2014   due to sleep disturbance  . Hypertension   . Parkinson disease ([place])   . Personal history of radiation therapy 06/2018-07/2018   right breast ca  . PONV (postoperative nausea and vomiting)     Past Surgical History:  Procedure Laterality Date  . BREAST BIOPSY Right 03/14/2018   11:00 DCIS and invasive ductal carcinoma  . BREAST BIOPSY Right 03/14/2018   1:00 Invasive ductal carcinoma  . BREAST LUMPECTOMY Right 04/27/2018   lumpectomy of 11 and 1:00 cancers, clear margins, negative LN  . BREAST LUMPECTOMY WITH RADIOACTIVE SEED AND SENTINEL LYMPH NODE BIOPSY Right 04/27/2018   Procedure: RIGHT BREAST LUMPECTOMY WITH RADIOACTIVE SEED X 2 AND RIGHT SENTINEL LYMPH NODE BIOPSY;  Surgeon: [name], MD;  Location: [facility];  Service: General;  Laterality: Right;  . CATARACT EXTRACTION W/PHACO Left 08/30/2019   Procedure: CATARACT EXTRACTION PHACO AND INTRAOCULAR LENS PLACEMENT ([place]) LEFT panoptix toric  01:20.5  12.7%  10.26;  Surgeon: Leandrew Koyanagi, MD;  Location: [facility];  Service: Ophthalmology;  Laterality: Left;  . CATARACT EXTRACTION W/PHACO Right 09/20/2019   Procedure: CATARACT EXTRACTION PHACO AND INTRAOCULAR LENS PLACEMENT ([facility]) RIGHT 5.71  00:36.4  15.7%;  Surgeon: Leandrew Koyanagi, MD;  Location: [facility];  Service: Ophthalmology;  Laterality: Right;  . LAPAROTOMY N/A 02/10/2018   Procedure: EXPLORATORY LAPAROTOMY;  Surgeon: Isabel Caprice, MD;  Location: [facility];  Service: Gynecology;  Laterality: N/A;  . MASS EXCISION  01/2018   abdominal  . OVARIAN CYST REMOVAL    . SALPINGOOPHORECTOMY Bilateral 02/10/2018   Procedure: BILATERAL SALPINGO OOPHORECTOMY; PERITONEAL WASHINGS;  Surgeon: Isabel Caprice, MD;  Location: [facility];  Service: Gynecology;  Laterality: Bilateral;  . TONSILLECTOMY    . TONSILLECTOMY AND ADENOIDECTOMY  1953    There were no vitals filed for this visit.   Subjective Assessment - 12/24/20 1306    Subjective Patient reports she had one fall since the previous session 3 days ago on Saturday. Patient states she was walking through the door and fell forward, she states she hit her head but did not lose  consciousness. Patient states she has a black eye (which is visible) and bruising along her L hand. Patient states no pain currently.    Pertinent History Patient report unclear regarding onset. Visited MD on Sept 16 and received injection with relief for approximately 1 month. Reports she cannot receive an additional injection pending eye surgery. Received dx from MD of hip bursitis in B hips. Reports pain with walking, stanindg, sitting 5/10 current 7/10 worst 3/10 best, with temporary relief when she is sitting "just right" and with  naproxen. Agg: activity. Cormorbidities: hx of breast cancer, HTN, MCI, insomnia, cataracts    Limitations Sitting;Standing;Walking;House hold activities    How long can you sit comfortably? Pain constant, reduced with neutral pelvic tilt    How long can you stand comfortably? Pain constant, increases with time    How long can you walk comfortably? Pain constant, increases with time    Patient Stated Goals Be able to move without hurting, walk 1 hour    Currently in Pain? No/denies             TREATMENT Therapeutic Exercise Step ups onto 6" - x 15 B  Backwards walking with ball toss - 4 x 20ft Soccer kicks in standing - 5 min in standing Wall rolls in standing - x 7 Cone weaving in standing - x 10 B directions cone finding with head turns with cone taps with foot - 2 x 16ft    Performing exercises to address pain and spasms     PT Education - 12/24/20 1312    Education provided Yes    Education Details form/technique with exercise    Person(s) Educated Patient    Methods Explanation;Demonstration    Comprehension Verbalized understanding;Returned demonstration            PT Short Term Goals - 09/12/19 1419      PT SHORT TERM GOAL #1   Title Patient will demonstrate complaince with HEP to speed recovery and reduce total number of visits.    Baseline HEP given    Time 2    Period Weeks    Status New    Target Date 09/27/19             PT Long Term Goals - 12/18/20 1140      PT LONG TERM GOAL #1   Title Patient will improve hip strength to 4/5 grossly to facilitate improved mobility with ADLs.    Baseline 3, 4-; 12/22 deferred to NV; 11/09/2019: 4-/5; 12/07/2019 L hip flexor 4, abd and ext 3+; 01/29/2020: 4/5 for most hip motions, 3+/5 for abd, ext; 03/20/2020: 4/5 for hip movements    Time 8    Period Weeks    Status Achieved      PT LONG TERM GOAL #2   Title Patient will demonstrate walking tolerance increased to 30 minutes without increase in symptoms to  demonstrate improved ability to perform walking program for cardiovascular health.    Baseline 10 minute tolerance (patient report); 12/22 10 minutes (per pt report); 11/09/2019: 7 min; 12/07/2019 unable to assess due to icy streets;  01/29/2020: 10 min; 03/20/2020 10 min; 05/14/2020: 10 min x 3 per day; 06/11/2020: 2min x 3 per day; 07/15/2020: 31min x 3 per day; 09/02/2020: 38min x 3 (just standing rest breaks in between); 11/06/2019: 10 min x 3    Time 8    Period Weeks    Status Partially Met      PT LONG TERM GOAL #3  Title Patient will report worst pain of 3/10 during therapy to demonstrate reduced disability    Baseline Worst 7/10; 12/22 worst 9/10; 11/09/2019: 9/10; 12/07/2019 7/10; 01/29/2020: 7/10; 05/14/2020: 0/ 10    Time 8    Period Weeks    Status Achieved      PT LONG TERM GOAL #4   Title Patient will demonstrate ability to perform all bed mobility maneuvers without increase in pain for reduced disability with mobility and transfers with ADLs.    Baseline Reports pain with bed mobility supine <> sidelying L/R, sidelying to sitting EOB; 12/22 pt able to perform I without increased pain    Time 8    Period Weeks    Status Achieved      PT LONG TERM GOAL #5   Title Patient will improve 10MW speed to 1.0 m/sec to meet norms for minimal fall risk and full community ambulation.    Baseline 0.88 m/sec; 12/22 0.96 m/sec; 11/09/2019: deferred; 12/07/2019 0.90 m/sec; 01/29/2020: .9  m/sec; 03/20/2020: .89m/s; 05/14/2020: . 9 m/s; 06/11/2020: .55m/s; 07/15/2020: 1 m/s    Time 8    Period Weeks    Status Achieved      PT LONG TERM GOAL #6   Title Patient will increase 6MWT by 200 ft to demonstrate improvement in functional capacity for transition to cardiac walking program    Baseline 6MWT = 1195; 1/7/20201: deferred; 12/07/2019 1095; 01/29/2020: deferred secondary to fatigue; 03/20/2020: 1032ft; 07/15/2020: 1122ft; 09/03/2020: 880ft; 11/05/2020: 943ft    Time 8    Period Weeks    Status On-going      PT LONG TERM  GOAL #7   Title Patient will improve her 5xsts to under 15 sec to indicate singificnat improvement in LE functional strength and decrease in fall risk.    Baseline 24.5 sec; 03/20/2020: 19.19 sec; 06/13/2020: 14. 92    Time 6    Period Weeks    Status Achieved      PT LONG TERM GOAL #8   Title Patient will improve FGA to 28/30 to indicate improvement in dynamic balance and improvement in stability.    Baseline 23/30; 06/11/2020: 25/30; 07/15/2020: 26/30; 09/02/2020: 27/30; 11/05/2020: 28/30    Time 6    Period Weeks    Status Achieved      PT LONG TERM GOAL  #9   TITLE Patient will be able to ascend/desend the stairs without use of UEs to increase safety and decrease fall risk with stair use.    Baseline requires use of UEs    Time 6    Period Weeks    Status New                 Plan - 12/24/20 1336    Clinical Impression Statement Peformed step ups and patient requires intermitrent modA to maintain balance as her body awareness with performing a step up is limited. Patient demonstrates inrceased fall risk when fatigued requring greater amount of effort to perform exercises as well as decreased body awareness. Will continue to progress these limitations and patient will benefit from further skilled therapy to return to prior level of function.    Personal Factors and Comorbidities Age;Comorbidity 3+;Time since onset of injury/illness/exacerbation;Comorbidity 2;Other    Comorbidities MCI, HTN, CA, cataracts    Examination-Activity Limitations Bend;Sit;Squat;Stairs;Stand;Sleep    Examination-Participation Restrictions Cleaning;Community Activity    Stability/Clinical Decision Making Evolving/Moderate complexity    Rehab Potential Fair    Clinical Impairments Affecting Rehab Potential MCI  PT Frequency 2x / week    PT Duration 6 weeks    PT Treatment/Interventions ADLs/Self Care Home Management;Therapeutic exercise;Patient/family education;Neuromuscular re-education;Therapeutic  activities;Functional mobility training;Balance training;Manual techniques;Gait training;Stair training;Moist Heat;Electrical Stimulation;Cryotherapy;Taping;Dry needling;Energy conservation;Passive range of motion;Joint Manipulations    PT Next Visit Plan Progress glute therex, pelvic-hip dissociation; check and progress HEP with stretches    PT Home Exercise Plan lateral step ups, step ups, hip kickers ext/abd red theraband    Consulted and Agree with Plan of Care Patient           Patient will benefit from skilled therapeutic intervention in order to improve the following deficits and impairments:  Decreased range of motion,Postural dysfunction,Pain,Abnormal gait,Decreased activity tolerance,Decreased coordination,Decreased endurance,Decreased balance,Decreased mobility,Difficulty walking,Hypomobility,Increased muscle spasms,Impaired perceived functional ability,Decreased strength,Impaired flexibility  Visit Diagnosis: Muscle weakness (generalized)  Difficulty in walking, not elsewhere classified  Pain in left hip  Pain in right hip     Problem List Patient Active Problem List   Diagnosis Date Noted  . Osteopenia of neck of right femur 09/06/2020  . Class 1 obesity with serious comorbidity and body mass index (BMI) of 32.0 to 32.9 in adult 08/29/2020  . Candidal skin infection 04/29/2020  . Parkinson disease ([place]) 02/22/2020  . 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 Bipolar disorder, in full remission, most recent episode depressed ([place]) 11/08/2019  . Avitaminosis D 08/24/2019  . Insomnia due to mental condition 05/31/2019  . Bipolar I disorder, most recent episode depressed ([place]) 05/31/2019  . Cognitive disorder 05/31/2019  . Alcohol use disorder, moderate, in sustained remission ([place]) 05/31/2019  . Elevated tumor markers 05/27/2019  . GAD (generalized anxiety disorder) 04/10/2019  . MDD (major depressive disorder), severe ([place]) 01/31/2019  . High serum carbohydrate antigen  19-9 (CA19-9) 12/01/2018  . Adjustment disorder with anxious mood 11/24/2018  . Essential hypertension 11/24/2018  . B12 deficiency 10/17/2018  . Incisional hernia, without obstruction or gangrene 09/12/2018  . Genetic testing 04/07/2018  . Malignant neoplasm of upper-outer quadrant of right breast in female, estrogen receptor positive ([place]) 03/17/2018  . Malignant neoplasm of upper-inner quadrant of right breast in female, estrogen receptor positive ([place]) 03/17/2018  . History of psychosis 02/07/2018  . History of insomnia 02/07/2018    Blythe Stanford, PT DPT 12/25/2020, 1:27 PM  [facility] [address] [place], [address] Phone: [phone]   Fax:  [phone]  Name: Beanca Kiester MRN: [id number] Date of Birth: 01-02-1946

## 2020-12-26 ENCOUNTER — Encounter: Payer: Self-pay | Admitting: Psychiatry

## 2020-12-26 ENCOUNTER — Other Ambulatory Visit: Payer: Self-pay

## 2020-12-26 ENCOUNTER — Telehealth (INDEPENDENT_AMBULATORY_CARE_PROVIDER_SITE_OTHER): Payer: Medicare PPO | Admitting: Psychiatry

## 2020-12-26 ENCOUNTER — Ambulatory Visit: Payer: Medicare PPO

## 2020-12-26 DIAGNOSIS — F5105 Insomnia due to other mental disorder: Secondary | ICD-10-CM | POA: Diagnosis not present

## 2020-12-26 DIAGNOSIS — M25552 Pain in left hip: Secondary | ICD-10-CM | POA: Diagnosis not present

## 2020-12-26 DIAGNOSIS — F09 Unspecified mental disorder due to known physiological condition: Secondary | ICD-10-CM

## 2020-12-26 DIAGNOSIS — R262 Difficulty in walking, not elsewhere classified: Secondary | ICD-10-CM

## 2020-12-26 DIAGNOSIS — F411 Generalized anxiety disorder: Secondary | ICD-10-CM

## 2020-12-26 DIAGNOSIS — F3176 Bipolar disorder, in full remission, most recent episode depressed: Secondary | ICD-10-CM | POA: Diagnosis not present

## 2020-12-26 DIAGNOSIS — M6281 Muscle weakness (generalized): Secondary | ICD-10-CM

## 2020-12-26 DIAGNOSIS — M25551 Pain in right hip: Secondary | ICD-10-CM | POA: Diagnosis not present

## 2020-12-26 NOTE — Progress Notes (Signed)
Virtual Visit via Telephone Note  I connected with Kendra [name] on 12/26/20 at  2:40 PM EST by telephone and verified that I am speaking with the correct person using two identifiers.  Location Provider Location : [facility] Patient Location : Home  Participants: Patient , [name] - spouse ,Provider    I discussed the limitations, risks, security and privacy concerns of performing an evaluation and management service by telephone and the availability of in person appointments. I also discussed with the patient that there may be a patient responsible charge related to this service. The patient expressed understanding and agreed to proceed.   I discussed the assessment and treatment plan with the patient. The patient was provided an opportunity to ask questions and all were answered. The patient agreed with the plan and demonstrated an understanding of the instructions.   The patient was advised to call back or seek an in-person evaluation if the symptoms worsen or if the condition fails to improve as anticipated.   [facility] MD OP Progress Note  12/26/2020 9:23 PM Elonda Giuliano  MRN:  [id number]  Chief Complaint:  Chief Complaint    Follow-up     HPI: Kendra [name] is a 75 year old Caucasian female, married, lives in [place], has a history of bipolar disorder, GAD, insomnia, cognitive disorder, multifocal stage I right breast cancer status post treatment-lumpectomy, ovarian cyst removal, Parkinson's disease, bilateral hearing loss was evaluated by telemedicine today.  Patient today was able to answer all questions appropriately however she was slow, delayed when responding.  Patient appeared to be alert, oriented to person place time and situation.  She reported overall as doing well on the medications.  She however reported she had a fall a week ago and does not clearly know how that happened.  She is bruised all over and may have hit her head.  She did not follow-up with any providers about  this yet.  She does not believe she was confused after the fall and does not remember having any memory issues soon after that.  Patient reports sleep is overall okay.  Patient denies any suicidality, homicidality or perceptual disturbances.  Per spouse patient making progress, but had a fall recently.  He was not present when she had the fall.  He however agrees to touch base with her primary care provider to see if she needs any further management.  According to spouse patient overall is currently doing okay.  She is compliant on medications.   Visit Diagnosis:    ICD-10-CM   1. Bipolar disorder, in full remission, most recent episode depressed ([place])  F31.76   2. GAD (generalized anxiety disorder)  F41.1   3. Insomnia due to mental condition  F51.05   4. Cognitive disorder  F09    likely mild    Past Psychiatric History: I have reviewed past psychiatric history from my progress note on 03/01/2019.  Past trials of risperidone, Prozac, Seroquel, Ativan.  Past Medical History:  Past Medical History:  Diagnosis Date  . Anxiety   . Breast cancer ([place]) 03/2018   right breast cancer at 11:00 and 1:00  . Bursitis    bilateral hips and knees  . Complication of anesthesia   . Depression   . Family history of breast cancer 03/24/2018  . Family history of lung cancer 03/24/2018  . History of alcohol dependence ([place]) 2007   no ETOH; resolved since 2007  . History of hiatal hernia   . History of psychosis 2014   due  to sleep disturbance  . Hypertension   . Parkinson disease ([place])   . Personal history of radiation therapy 06/2018-07/2018   right breast ca  . PONV (postoperative nausea and vomiting)     Past Surgical History:  Procedure Laterality Date  . BREAST BIOPSY Right 03/14/2018   11:00 DCIS and invasive ductal carcinoma  . BREAST BIOPSY Right 03/14/2018   1:00 Invasive ductal carcinoma  . BREAST LUMPECTOMY Right 04/27/2018   lumpectomy of 11 and 1:00 cancers, clear margins,  negative LN  . BREAST LUMPECTOMY WITH RADIOACTIVE SEED AND SENTINEL LYMPH NODE BIOPSY Right 04/27/2018   Procedure: RIGHT BREAST LUMPECTOMY WITH RADIOACTIVE SEED X 2 AND RIGHT SENTINEL LYMPH NODE BIOPSY;  Surgeon: [name], MD;  Location: [facility];  Service: General;  Laterality: Right;  . CATARACT EXTRACTION W/PHACO Left 08/30/2019   Procedure: CATARACT EXTRACTION PHACO AND INTRAOCULAR LENS PLACEMENT ([place]) LEFT panoptix toric  01:20.5  12.7%  10.26;  Surgeon: Leandrew Koyanagi, MD;  Location: [facility];  Service: Ophthalmology;  Laterality: Left;  . CATARACT EXTRACTION W/PHACO Right 09/20/2019   Procedure: CATARACT EXTRACTION PHACO AND INTRAOCULAR LENS PLACEMENT ([facility]) RIGHT 5.71  00:36.4  15.7%;  Surgeon: Leandrew Koyanagi, MD;  Location: [facility];  Service: Ophthalmology;  Laterality: Right;  . LAPAROTOMY N/A 02/10/2018   Procedure: EXPLORATORY LAPAROTOMY;  Surgeon: Isabel Caprice, MD;  Location: [facility];  Service: Gynecology;  Laterality: N/A;  . MASS EXCISION  01/2018   abdominal  . OVARIAN CYST REMOVAL    . SALPINGOOPHORECTOMY Bilateral 02/10/2018   Procedure: BILATERAL SALPINGO OOPHORECTOMY; PERITONEAL WASHINGS;  Surgeon: Isabel Caprice, MD;  Location: [facility];  Service: Gynecology;  Laterality: Bilateral;  . TONSILLECTOMY    . TONSILLECTOMY AND ADENOIDECTOMY  1953    Family Psychiatric History: I have reviewed family psychiatric history from my progress note on 03/01/2019  Family History:  Family History  Problem Relation Age of Onset  . Diabetes Mother   . Breast cancer Mother 49  . Hypertension Mother   . Lung cancer Father        asbestos exposure  . Heart disease Brother 58  . Breast cancer Cousin 78       paternal cousin  . Heart disease Paternal Grandmother 27  . Heart disease Paternal Grandfather     Social History: Reviewed social history from my progress note on 03/01/2019 Social History   Socioeconomic History   . Marital status: Married    Spouse name: robert  . Number of children: 0  . Years of education: Not on file  . Highest education level: Some college, no degree  Occupational History  . Occupation: retired Furniture conservator/restorer of education  Tobacco Use  . Smoking status: Never Smoker  . Smokeless tobacco: Never Used  Vaping Use  . Vaping Use: Never used  Substance and Sexual Activity  . Alcohol use: Not Currently    Comment: alcohol dependence prior to 2007  . Drug use: Never  . Sexual activity: Yes    Partners: Male    Birth control/protection: Surgical  Other Topics Concern  . Not on file  Social History Narrative  . Not on file   Social Determinants of Health   Financial Resource Strain: Low Risk   . Difficulty of Paying Living Expenses: Not hard at all  Food Insecurity: No Food Insecurity  . Worried About Charity fundraiser in the Last Year: Never true  . Ran Out of Food in  the Last Year: Never true  Transportation Needs: No Transportation Needs  . Lack of Transportation (Medical): No  . Lack of Transportation (Non-Medical): No  Physical Activity: Inactive  . Days of Exercise per Week: 0 days  . Minutes of Exercise per Session: 0 min  Stress: Stress Concern Present  . Feeling of Stress : To some extent  Social Connections: Moderately Integrated  . Frequency of Communication with Friends and Family: More than three times a week  . Frequency of Social Gatherings with Friends and Family: More than three times a week  . Attends Religious Services: Never  . Active Member of Clubs or Organizations: Yes  . Attends Archivist Meetings: 1 to 4 times per year  . Marital Status: Married    Allergies:  Allergies  Allergen Reactions  . Hydroxyzine Anaphylaxis    Tongue swollen  . Other Other (See Comments)    Food poisoning   . Shrimp [Shellfish Allergy] Other (See Comments)    Food poisoning     Metabolic Disorder Labs: Lab Results  Component  Value Date   HGBA1C 5.4 04/13/2019   Lab Results  Component Value Date   PROLACTIN 101.9 06/20/2015   Lab Results  Component Value Date   CHOL 173 04/13/2019   TRIG 78 04/13/2019   HDL 62 04/13/2019   LDLCALC 95 04/13/2019   LDLCALC 103 06/02/2017   Lab Results  Component Value Date   TSH 2.31 04/13/2019   TSH 2.543 07/25/2018    Therapeutic Level Labs: No results found for: LITHIUM No results found for: VALPROATE No components found for:  CBMZ  Current Medications: Current Outpatient Medications  Medication Sig Dispense Refill  . Acetaminophen 500 MG capsule Take 1,000 mg by mouth every 6 (six) hours as needed.     . carbidopa-levodopa (SINEMET IR) 25-100 MG tablet Take 2 tablets by mouth 4 (four) times daily.     . carbidopa-levodopa (SINEMET IR) 25-100 MG tablet Take 1 tablet by mouth at bedtime.     . cyanocobalamin (,VITAMIN B-12,) 1000 MCG/ML injection Inject 1 mL (1,000 mcg total) into the muscle every 30 (thirty) days. 3 mL 3  . FLUoxetine HCl 60 MG TABS Take 60 mg by mouth daily. 90 tablet 1  . letrozole (FEMARA) 2.5 MG tablet Take 1 tablet (2.5 mg total) by mouth daily. 90 tablet 3  . lisinopril-hydrochlorothiazide (ZESTORETIC) 20-12.5 MG tablet Take 1 tablet by mouth daily. 90 tablet 3  . naproxen sodium (ALEVE) 220 MG tablet Take 220 mg by mouth 2 (two) times daily as needed.     Marland Kitchen NYAMYC powder APPLY TOPICALLY THREE TIMES A DAY 15 g 1  . polyethylene glycol (MIRALAX / GLYCOLAX) 17 g packet Take 17 g by mouth daily.    . risperiDONE (RISPERDAL) 2 MG tablet Take 0.5 tablets (1 mg total) by mouth 2 (two) times daily. 90 tablet 3  . traZODone (DESYREL) 100 MG tablet Take 1 tablet (100 mg total) by mouth at bedtime. 90 tablet 0   Current Facility-Administered Medications  Medication Dose Route Frequency Provider Last Rate Last Admin  . lidocaine (PF) (XYLOCAINE) 1 % injection 4 mL  4 mL Intradermal Once Virginia Crews, MD      . lidocaine (PF) (XYLOCAINE) 1  % injection 4 mL  4 mL Intradermal Once Brita Romp Dionne Bucy, MD         Musculoskeletal: Strength & Muscle Tone: [place]: UTA Patient leans: N/A  Psychiatric Specialty Exam:  Review of Systems  Psychiatric/Behavioral: The patient is nervous/anxious.   All other systems reviewed and are negative.   There were no vitals taken for this visit.There is no height or weight on file to calculate BMI.  General Appearance: UTA  Eye Contact:  UTA  Speech:  Clear and Coherent  Volume:  Normal  Mood:  Anxious  Affect:  UTA  Thought Process:  Goal Directed and Descriptions of Associations: Intact  Orientation:  Full (Time, Place, and Person)  Thought Content: Logical   Suicidal Thoughts:  No  Homicidal Thoughts:  No  Memory:  Immediate;   Fair Recent;   Fair Remote;   Limited  Judgement:  Fair  Insight:  Shallow  Psychomotor Activity:  UTA  Concentration:  Concentration: Fair and Attention Span: Fair  Recall:  [employer] of Knowledge: Fair  Language: Fair  Akathisia:  No  Handed:  Right  AIMS (if indicated): UTA  Assets:  Communication Skills Desire for Improvement Housing Intimacy Social Support  ADL's:  Intact  Cognition: WNL  Sleep:  Fair   Screenings: GAD-7   Health and safety inspector 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 [facility]  Total GAD-7 Score 7    PHQ2-9   [place] from 08/29/2020 in [facility] 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 [facility] Office Visit from 11/24/2018 in [facility]  PHQ-2 Total Score 2 3 0  PHQ-9 Total Score 6 17 3     Flowsheet Row Video Visit from 12/26/2020 in [facility] Video Visit from 12/11/2020 in [facility] ED from 04/10/2019 in [facility] CATEGORY No Risk No Risk No Risk       Assessment and Plan: Revecca Nachtigal is a  75 year old Caucasian female, married, lives in [place], has a history of bipolar disorder, GAD, insomnia, multiple medical problems, primary Parkinson's disease, hearing loss was evaluated by telemedicine today.  Patient is biologically predisposed given her multiple health issues.  Patient with hearing loss, cognitive issues, Parkinson's disease, is currently doing well with regards to her mood.  Discussed plan as noted below.  Plan Bipolar disorder in remission Risperidone 2 mg p.o. daily Prozac 60 mg p.o. daily  GAD-stable Continue CBT with Ms. Christina Hussami Continue Prozac 60 mg p.o. daily  Insomnia-stable Trazodone 50 to 100 mg p.o. nightly as needed Melatonin as needed  Cognitive disorder-patient advised to continue to follow-up with neurology.  Patient also with recent fall, likely due to gait instability,Parkinson's disease, patient advised to follow-up with primary care provider/neurologist.  Collateral information obtained from husband as noted above.  Follow-up in clinic in 6 to 8 weeks or sooner in the office  I have spent atleast 20 minutes non face to face  with patient today. More than 50 % of the time was spent for preparing to see the patient ( e.g., review of test, records ), ordering medications and test ,psychoeducation and supportive psychotherapy and care coordination,as well as documenting clinical information in electronic health record. This note was generated in part or whole with voice recognition software. Voice recognition is usually quite accurate but there are transcription errors that can and very often do occur. I apologize for any typographical errors that were not detected and corrected.         Ursula Alert, MD 12/26/2020, 9:23 PM

## 2020-12-26 NOTE — Therapy (Signed)
[facility] [address], [place], [address] Phone: [phone]   Fax:  [phone]  Physical Therapy Treatment  Patient Details  Name: Kendra [name] MRN: [id number] Date of Birth: [birth year] No data recorded  Encounter Date: 12/26/2020   PT End of Session - 12/26/20 0829    Visit Number 60    Number of Visits 90    Date for PT Re-Evaluation 01/01/21    Authorization Type Recert at visit 69    PT Start Time 0815    PT Stop Time 0900    PT Time Calculation (min) 45 min    Equipment Utilized During Treatment Gait belt    Activity Tolerance Patient tolerated treatment well;No increased pain    Behavior During Therapy WFL for tasks assessed/performed           Past Medical History:  Diagnosis Date  . Anxiety   . Breast cancer ([place]) 03/2018   right breast cancer at 11:00 and 1:00  . Bursitis    bilateral hips and knees  . Complication of anesthesia   . Depression   . Family history of breast cancer 03/24/2018  . Family history of lung cancer 03/24/2018  . History of alcohol dependence ([place]) 2007   no ETOH; resolved since 2007  . History of hiatal hernia   . History of psychosis 2014   due to sleep disturbance  . Hypertension   . Parkinson disease ([place])   . Personal history of radiation therapy 06/2018-07/2018   right breast ca  . PONV (postoperative nausea and vomiting)     Past Surgical History:  Procedure Laterality Date  . BREAST BIOPSY Right 03/14/2018   11:00 DCIS and invasive ductal carcinoma  . BREAST BIOPSY Right 03/14/2018   1:00 Invasive ductal carcinoma  . BREAST LUMPECTOMY Right 04/27/2018   lumpectomy of 11 and 1:00 cancers, clear margins, negative LN  . BREAST LUMPECTOMY WITH RADIOACTIVE SEED AND SENTINEL LYMPH NODE BIOPSY Right 04/27/2018   Procedure: RIGHT BREAST LUMPECTOMY WITH RADIOACTIVE SEED X 2 AND RIGHT SENTINEL LYMPH NODE BIOPSY;  Surgeon: [name], MD;   Location: [facility];  Service: General;  Laterality: Right;  . CATARACT EXTRACTION W/PHACO Left 08/30/2019   Procedure: CATARACT EXTRACTION PHACO AND INTRAOCULAR LENS PLACEMENT ([place]) LEFT panoptix toric  01:20.5  12.7%  10.26;  Surgeon: Leandrew Koyanagi, MD;  Location: [facility];  Service: Ophthalmology;  Laterality: Left;  . CATARACT EXTRACTION W/PHACO Right 09/20/2019   Procedure: CATARACT EXTRACTION PHACO AND INTRAOCULAR LENS PLACEMENT ([facility]) RIGHT 5.71  00:36.4  15.7%;  Surgeon: Leandrew Koyanagi, MD;  Location: [facility];  Service: Ophthalmology;  Laterality: Right;  . LAPAROTOMY N/A 02/10/2018   Procedure: EXPLORATORY LAPAROTOMY;  Surgeon: Isabel Caprice, MD;  Location: [facility];  Service: Gynecology;  Laterality: N/A;  . MASS EXCISION  01/2018   abdominal  . OVARIAN CYST REMOVAL    . SALPINGOOPHORECTOMY Bilateral 02/10/2018   Procedure: BILATERAL SALPINGO OOPHORECTOMY; PERITONEAL WASHINGS;  Surgeon: Isabel Caprice, MD;  Location: [facility];  Service: Gynecology;  Laterality: Bilateral;  . TONSILLECTOMY    . TONSILLECTOMY AND ADENOIDECTOMY  1953    There were no vitals filed for this visit.   Subjective Assessment - 12/26/20 0819    Subjective Patient reports she is feeling groggy this morning, states her right side is bothering her today from the fall reporting that is feels sore. No other complaints otherwise.    Pertinent History  Patient report unclear regarding onset. Visited MD on Sept 16 and received injection with relief for approximately 1 month. Reports she cannot receive an additional injection pending eye surgery. Received dx from MD of hip bursitis in B hips. Reports pain with walking, stanindg, sitting 5/10 current 7/10 worst 3/10 best, with temporary relief when she is sitting "just right" and with naproxen. Agg: activity. Cormorbidities: hx of breast cancer, HTN, MCI, insomnia, cataracts    Limitations Sitting;Standing;Walking;House  hold activities    How long can you sit comfortably? Pain constant, reduced with neutral pelvic tilt    How long can you stand comfortably? Pain constant, increases with time    How long can you walk comfortably? Pain constant, increases with time    Patient Stated Goals Be able to move without hurting, walk 1 hour    Currently in Pain? No/denies             TREATMENT Therapeutic Exercise Step ups onto 6" - x 15 B  Backwards walking with reaching for cones - 4 x 73f Soccer kicks in standing - 5 min in standing Step ups onto 6" step - x 10  Wall rolls in standing - x 10 Obstacle course with weaving in and out of cones and stepping onto airex pad -  15 x 6 Cone pick ups off the floor - x 10      Performing exercises to address pain and spasms      PT Education - 12/26/20 0821    Education provided Yes    Education Details form/technique with exercise    Person(s) Educated Patient    Methods Explanation;Demonstration    Comprehension Verbalized understanding;Returned demonstration            PT Short Term Goals - 09/12/19 1419      PT SHORT TERM GOAL #1   Title Patient will demonstrate complaince with HEP to speed recovery and reduce total number of visits.    Baseline HEP given    Time 2    Period Weeks    Status New    Target Date 09/27/19             PT Long Term Goals - 12/18/20 1140      PT LONG TERM GOAL #1   Title Patient will improve hip strength to 4/5 grossly to facilitate improved mobility with ADLs.    Baseline 3, 4-; 12/22 deferred to NV; 11/09/2019: 4-/5; 12/07/2019 L hip flexor 4, abd and ext 3+; 01/29/2020: 4/5 for most hip motions, 3+/5 for abd, ext; 03/20/2020: 4/5 for hip movements    Time 8    Period Weeks    Status Achieved      PT LONG TERM GOAL #2   Title Patient will demonstrate walking tolerance increased to 30 minutes without increase in symptoms to demonstrate improved ability to perform walking program for cardiovascular health.     Baseline 10 minute tolerance (patient report); 12/22 10 minutes (per pt report); 11/09/2019: 7 min; 12/07/2019 unable to assess due to icy streets;  01/29/2020: 10 min; 03/20/2020 10 min; 05/14/2020: 10 min x 3 per day; 06/11/2020: 155m x 3 per day; 07/15/2020: 1069mx 3 per day; 09/02/2020: 23m21m 3 (just standing rest breaks in between); 11/06/2019: 10 min x 3    Time 8    Period Weeks    Status Partially Met      PT LONG TERM GOAL #3   Title Patient will report worst pain of 3/10  during therapy to demonstrate reduced disability    Baseline Worst 7/10; 12/22 worst 9/10; 11/09/2019: 9/10; 12/07/2019 7/10; 01/29/2020: 7/10; 05/14/2020: 0/ 10    Time 8    Period Weeks    Status Achieved      PT LONG TERM GOAL #4   Title Patient will demonstrate ability to perform all bed mobility maneuvers without increase in pain for reduced disability with mobility and transfers with ADLs.    Baseline Reports pain with bed mobility supine <> sidelying L/R, sidelying to sitting EOB; 12/22 pt able to perform I without increased pain    Time 8    Period Weeks    Status Achieved      PT LONG TERM GOAL #5   Title Patient will improve 10MW speed to 1.0 m/sec to meet norms for minimal fall risk and full community ambulation.    Baseline 0.88 m/sec; 12/22 0.96 m/sec; 11/09/2019: deferred; 12/07/2019 0.90 m/sec; 01/29/2020: .9  m/sec; 03/20/2020: .52ms; 05/14/2020: . 9 m/s; 06/11/2020: .94m; 07/15/2020: 1 m/s    Time 8    Period Weeks    Status Achieved      PT LONG TERM GOAL #6   Title Patient will increase 6MWT by 200 ft to demonstrate improvement in functional capacity for transition to cardiac walking program    Baseline 6MWT = 1195; 1/7/20201: deferred; 12/07/2019 1095; 01/29/2020: deferred secondary to fatigue; 03/20/2020: 10709f9/13/2021: 1130f80f1/12/2019: 800ft52f4/2022: 973ft 3fime 8    Period Weeks    Status On-going      PT LONG TERM GOAL #7   Title Patient will improve her 5xsts to under 15 sec to indicate  singificnat improvement in LE functional strength and decrease in fall risk.    Baseline 24.5 sec; 03/20/2020: 19.19 sec; 06/13/2020: 14. 92    Time 6    Period Weeks    Status Achieved      PT LONG TERM GOAL #8   Title Patient will improve FGA to 28/30 to indicate improvement in dynamic balance and improvement in stability.    Baseline 23/30; 06/11/2020: 25/30; 07/15/2020: 26/30; 09/02/2020: 27/30; 11/05/2020: 28/30    Time 6    Period Weeks    Status Achieved      PT LONG TERM GOAL  #9   TITLE Patient will be able to ascend/desend the stairs without use of UEs to increase safety and decrease fall risk with stair use.    Baseline requires use of UEs    Time 6    Period Weeks    Status New                 Plan - 12/26/20 0830    Clinical Impression Statement Continued to perform exercises to challenge dynamic balance working on stepping out to maintain balance. Patient demonstrates decreased episodes of LOB backwards after walking backwards today indicating functional carryover. Although patient is improving she continues to have increased difficutly with performing step ups without UE support (how she recently fell and demosntrated during today's session). Patient will benefit from further skilled therapy to improve functional capacity.    Personal Factors and Comorbidities Age;Comorbidity 3+;Time since onset of injury/illness/exacerbation;Comorbidity 2;Other    Comorbidities MCI, HTN, CA, cataracts    Examination-Activity Limitations Bend;Sit;Squat;Stairs;Stand;Sleep    Examination-Participation Restrictions Cleaning;Community Activity    Stability/Clinical Decision Making Evolving/Moderate complexity    Rehab Potential Fair    Clinical Impairments Affecting Rehab Potential MCI    PT Frequency 2x / week  PT Duration 6 weeks    PT Treatment/Interventions ADLs/Self Care Home Management;Therapeutic exercise;Patient/family education;Neuromuscular re-education;Therapeutic  activities;Functional mobility training;Balance training;Manual techniques;Gait training;Stair training;Moist Heat;Electrical Stimulation;Cryotherapy;Taping;Dry needling;Energy conservation;Passive range of motion;Joint Manipulations    PT Next Visit Plan Progress glute therex, pelvic-hip dissociation; check and progress HEP with stretches    PT Home Exercise Plan lateral step ups, step ups, hip kickers ext/abd red theraband    Consulted and Agree with Plan of Care Patient           Patient will benefit from skilled therapeutic intervention in order to improve the following deficits and impairments:  Decreased range of motion,Postural dysfunction,Pain,Abnormal gait,Decreased activity tolerance,Decreased coordination,Decreased endurance,Decreased balance,Decreased mobility,Difficulty walking,Hypomobility,Increased muscle spasms,Impaired perceived functional ability,Decreased strength,Impaired flexibility  Visit Diagnosis: Muscle weakness (generalized)  Difficulty in walking, not elsewhere classified     Problem List Patient Active Problem List   Diagnosis Date Noted  . Osteopenia of neck of right femur 09/06/2020  . Class 1 obesity with serious comorbidity and body mass index (BMI) of 32.0 to 32.9 in adult 08/29/2020  . Candidal skin infection 04/29/2020  . Parkinson disease ([place]) 02/22/2020  . 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 Bipolar disorder, in full remission, most recent episode depressed ([place]) 11/08/2019  . Avitaminosis D 08/24/2019  . Insomnia due to mental condition 05/31/2019  . Bipolar I disorder, most recent episode depressed ([place]) 05/31/2019  . Cognitive disorder 05/31/2019  . Alcohol use disorder, moderate, in sustained remission ([place]) 05/31/2019  . Elevated tumor markers 05/27/2019  . GAD (generalized anxiety disorder) 04/10/2019  . MDD (major depressive disorder), severe ([place]) 01/31/2019  . High serum carbohydrate antigen 19-9 (CA19-9) 12/01/2018  . Adjustment  disorder with anxious mood 11/24/2018  . Essential hypertension 11/24/2018  . B12 deficiency 10/17/2018  . Incisional hernia, without obstruction or gangrene 09/12/2018  . Genetic testing 04/07/2018  . Malignant neoplasm of upper-outer quadrant of right breast in female, estrogen receptor positive ([place]) 03/17/2018  . Malignant neoplasm of upper-inner quadrant of right breast in female, estrogen receptor positive ([place]) 03/17/2018  . History of psychosis 02/07/2018  . History of insomnia 02/07/2018    Blythe Stanford, PT DPT 12/26/2020, 8:55 AM  [facility] [address] [place], [address] Phone: [phone]   Fax:  [phone]  Name: Mylei Brackeen MRN: [id number] Date of Birth: [birth year]

## 2020-12-30 ENCOUNTER — Ambulatory Visit: Payer: Medicare PPO

## 2021-01-01 ENCOUNTER — Ambulatory Visit (INDEPENDENT_AMBULATORY_CARE_PROVIDER_SITE_OTHER): Payer: Medicare PPO | Admitting: Licensed Clinical Social Worker

## 2021-01-01 ENCOUNTER — Ambulatory Visit: Payer: Medicare PPO | Attending: Family Medicine

## 2021-01-01 ENCOUNTER — Other Ambulatory Visit: Payer: Self-pay

## 2021-01-01 DIAGNOSIS — M6281 Muscle weakness (generalized): Secondary | ICD-10-CM | POA: Diagnosis not present

## 2021-01-01 DIAGNOSIS — M25522 Pain in left elbow: Secondary | ICD-10-CM | POA: Insufficient documentation

## 2021-01-01 DIAGNOSIS — M25622 Stiffness of left elbow, not elsewhere classified: Secondary | ICD-10-CM | POA: Diagnosis not present

## 2021-01-01 DIAGNOSIS — M25551 Pain in right hip: Secondary | ICD-10-CM | POA: Insufficient documentation

## 2021-01-01 DIAGNOSIS — M25552 Pain in left hip: Secondary | ICD-10-CM | POA: Insufficient documentation

## 2021-01-01 DIAGNOSIS — R262 Difficulty in walking, not elsewhere classified: Secondary | ICD-10-CM | POA: Insufficient documentation

## 2021-01-01 DIAGNOSIS — F3176 Bipolar disorder, in full remission, most recent episode depressed: Secondary | ICD-10-CM | POA: Diagnosis not present

## 2021-01-01 NOTE — Progress Notes (Addendum)
Virtual Visit via Audio Note  I connected with Kendra [name] on 01/01/21 at  3:00 PM EST by an audio enabled telemedicine application and verified that I am speaking with the correct person using two identifiers.  Location: Patient: home Provider: [facility]   I discussed the limitations of evaluation and management by telemedicine and the availability of in person appointments. The patient expressed understanding and agreed to proceed.   I discussed the assessment and treatment plan with the patient. The patient was provided an opportunity to ask questions and all were answered. The patient agreed with the plan and demonstrated an understanding of the instructions.   The patient was advised to call back or seek an in-person evaluation if the symptoms worsen or if the condition fails to improve as anticipated.  I provided 45 minutes of non-face-to-face time during this encounter.   Kendra [name] Kendra Raye Wiens, LCSW    THERAPIST PROGRESS NOTE  Session Time: 3-3:45p  Participation Level: Active  Behavioral Response: NeatAlertAnxious  Type of Therapy: Individual Therapy  Treatment Goals addressed: Coping  Interventions: Solution Focused and Supportive  Summary: Kendra [name] is a 75 y.o. female who presents with improving symptoms related to bipolar disorder diagnosis. Pt reports that overall mood has been stable and that pt is managing stress and anxiety well.   Allowed pt to explore and express thoughts and feelings about recent current events. Pt reports feeling a bit of stress about husband leaving for a weekend, but developed a "safety plan" utilizing strategies from the last time he was out of town and it worked well for patient.   Pt reports that she had a recent fall and is still physically recovering from that. Pt states "it was because I didn't use a walker but I don't want to use one and it wouldn't fit through my narrow doorways, anyway".   Used strengths based approach to get  pt to identify strengths and utilize in hobbies and preferred activities.   Continued recommendations are as follows: self care behaviors, positive social engagements, focusing on overall work/home/life balance, and focusing on positive physical and emotional wellness. .   Suicidal/Homicidal: No  Therapist Response: Kendra [name] is continuing to identify anxiety triggers and utilize strategies that have been helpful in the past to manage symptoms. Kendra [name] is continuing to develop healthy cognitive patterns and is participating in regular social activities/conversations that reflect increasing energy, interest, and is a tool to help overcome depression symptoms. Pts choices reflect personal growth and progress. Treatment to continue as indicated.   Plan: Return again in 4 weeks.  Diagnosis: Axis I: Bipolar, Depressed    Axis II: No diagnosis    Kendra Bo Kendra Raether, LCSW 01/01/2021

## 2021-01-01 NOTE — Therapy (Signed)
[facility] [address], [place], [address] Phone: [phone]   Fax:  [phone]  Physical Therapy Treatment  Patient Details  Name: Kendra [name] MRN: [id number] Date of Birth: Jun 30, 1946 No data recorded  Encounter Date: 01/01/2021   PT End of Session - 01/01/21 1443    Visit Number 44    Number of Visits 90    Date for PT Re-Evaluation 01/01/21    Authorization Type Recert at visit 37    PT Start Time 1300    PT Stop Time 1345    PT Time Calculation (min) 45 min    Equipment Utilized During Treatment Gait belt    Activity Tolerance Patient tolerated treatment well;No increased pain    Behavior During Therapy WFL for tasks assessed/performed           Past Medical History:  Diagnosis Date  . Anxiety   . Breast cancer ([place]) 03/2018   right breast cancer at 11:00 and 1:00  . Bursitis    bilateral hips and knees  . Complication of anesthesia   . Depression   . Family history of breast cancer 03/24/2018  . Family history of lung cancer 03/24/2018  . History of alcohol dependence ([place]) 2007   no ETOH; resolved since 2007  . History of hiatal hernia   . History of psychosis 2014   due to sleep disturbance  . Hypertension   . Parkinson disease ([place])   . Personal history of radiation therapy 06/2018-07/2018   right breast ca  . PONV (postoperative nausea and vomiting)     Past Surgical History:  Procedure Laterality Date  . BREAST BIOPSY Right 03/14/2018   11:00 DCIS and invasive ductal carcinoma  . BREAST BIOPSY Right 03/14/2018   1:00 Invasive ductal carcinoma  . BREAST LUMPECTOMY Right 04/27/2018   lumpectomy of 11 and 1:00 cancers, clear margins, negative LN  . BREAST LUMPECTOMY WITH RADIOACTIVE SEED AND SENTINEL LYMPH NODE BIOPSY Right 04/27/2018   Procedure: RIGHT BREAST LUMPECTOMY WITH RADIOACTIVE SEED X 2 AND RIGHT SENTINEL LYMPH NODE BIOPSY;  Surgeon: [name], MD;   Location: [facility];  Service: General;  Laterality: Right;  . CATARACT EXTRACTION W/PHACO Left 08/30/2019   Procedure: CATARACT EXTRACTION PHACO AND INTRAOCULAR LENS PLACEMENT ([place]) LEFT panoptix toric  01:20.5  12.7%  10.26;  Surgeon: Leandrew Koyanagi, MD;  Location: [facility];  Service: Ophthalmology;  Laterality: Left;  . CATARACT EXTRACTION W/PHACO Right 09/20/2019   Procedure: CATARACT EXTRACTION PHACO AND INTRAOCULAR LENS PLACEMENT ([facility]) RIGHT 5.71  00:36.4  15.7%;  Surgeon: Leandrew Koyanagi, MD;  Location: [facility];  Service: Ophthalmology;  Laterality: Right;  . LAPAROTOMY N/A 02/10/2018   Procedure: EXPLORATORY LAPAROTOMY;  Surgeon: Isabel Caprice, MD;  Location: [facility];  Service: Gynecology;  Laterality: N/A;  . MASS EXCISION  01/2018   abdominal  . OVARIAN CYST REMOVAL    . SALPINGOOPHORECTOMY Bilateral 02/10/2018   Procedure: BILATERAL SALPINGO OOPHORECTOMY; PERITONEAL WASHINGS;  Surgeon: Isabel Caprice, MD;  Location: [facility];  Service: Gynecology;  Laterality: Bilateral;  . TONSILLECTOMY    . TONSILLECTOMY AND ADENOIDECTOMY  1953    There were no vitals filed for this visit.   Subjective Assessment - 01/01/21 1319    Subjective Patient states she had 2 days without increased dizziness when waking up. Patient states her hand bothers her in the morning ever since her fall.    Pertinent History Patient report unclear  regarding onset. Visited MD on Sept 16 and received injection with relief for approximately 1 month. Reports she cannot receive an additional injection pending eye surgery. Received dx from MD of hip bursitis in B hips. Reports pain with walking, stanindg, sitting 5/10 current 7/10 worst 3/10 best, with temporary relief when she is sitting "just right" and with naproxen. Agg: activity. Cormorbidities: hx of breast cancer, HTN, MCI, insomnia, cataracts    Limitations Sitting;Standing;Walking;House hold activities    How long  can you sit comfortably? Pain constant, reduced with neutral pelvic tilt    How long can you stand comfortably? Pain constant, increases with time    How long can you walk comfortably? Pain constant, increases with time    Patient Stated Goals Be able to move without hurting, walk 1 hour    Currently in Pain? No/denies             TREATMENT Therapeutic Exercise Wall rolls in standing - x 10 Backwards walking with reaching for cones - 4 x 27ft Seated reach out, up back and down - x 20 Walking with ball rotations rotations in standing - 232ft Cone pick ups off the floor -  2x 10  Step ups onto 6" - x 15 B  Soccer kicks in standing - 5 min in standing     Performing exercises to address pain and spasms       PT Education - 01/01/21 1320    Education provided Yes    Education Details form/technique with exercise    Person(s) Educated Patient    Methods Explanation;Demonstration    Comprehension Verbalized understanding;Returned demonstration            PT Short Term Goals - 09/12/19 1419      PT SHORT TERM GOAL #1   Title Patient will demonstrate complaince with HEP to speed recovery and reduce total number of visits.    Baseline HEP given    Time 2    Period Weeks    Status New    Target Date 09/27/19             PT Long Term Goals - 12/18/20 1140      PT LONG TERM GOAL #1   Title Patient will improve hip strength to 4/5 grossly to facilitate improved mobility with ADLs.    Baseline 3, 4-; 12/22 deferred to NV; 11/09/2019: 4-/5; 12/07/2019 L hip flexor 4, abd and ext 3+; 01/29/2020: 4/5 for most hip motions, 3+/5 for abd, ext; 03/20/2020: 4/5 for hip movements    Time 8    Period Weeks    Status Achieved      PT LONG TERM GOAL #2   Title Patient will demonstrate walking tolerance increased to 30 minutes without increase in symptoms to demonstrate improved ability to perform walking program for cardiovascular health.    Baseline 10 minute tolerance (patient  report); 12/22 10 minutes (per pt report); 11/09/2019: 7 min; 12/07/2019 unable to assess due to icy streets;  01/29/2020: 10 min; 03/20/2020 10 min; 05/14/2020: 10 min x 3 per day; 06/11/2020: 86min x 3 per day; 07/15/2020: 69min x 3 per day; 09/02/2020: 30min x 3 (just standing rest breaks in between); 11/06/2019: 10 min x 3    Time 8    Period Weeks    Status Partially Met      PT LONG TERM GOAL #3   Title Patient will report worst pain of 3/10 during therapy to demonstrate reduced disability    Baseline  Worst 7/10; 12/22 worst 9/10; 11/09/2019: 9/10; 12/07/2019 7/10; 01/29/2020: 7/10; 05/14/2020: 0/ 10    Time 8    Period Weeks    Status Achieved      PT LONG TERM GOAL #4   Title Patient will demonstrate ability to perform all bed mobility maneuvers without increase in pain for reduced disability with mobility and transfers with ADLs.    Baseline Reports pain with bed mobility supine <> sidelying L/R, sidelying to sitting EOB; 12/22 pt able to perform I without increased pain    Time 8    Period Weeks    Status Achieved      PT LONG TERM GOAL #5   Title Patient will improve 10MW speed to 1.0 m/sec to meet norms for minimal fall risk and full community ambulation.    Baseline 0.88 m/sec; 12/22 0.96 m/sec; 11/09/2019: deferred; 12/07/2019 0.90 m/sec; 01/29/2020: .9  m/sec; 03/20/2020: .35m/s; 05/14/2020: . 9 m/s; 06/11/2020: .27m/s; 07/15/2020: 1 m/s    Time 8    Period Weeks    Status Achieved      PT LONG TERM GOAL #6   Title Patient will increase 6MWT by 200 ft to demonstrate improvement in functional capacity for transition to cardiac walking program    Baseline 6MWT = 1195; 1/7/20201: deferred; 12/07/2019 1095; 01/29/2020: deferred secondary to fatigue; 03/20/2020: 1051ft; 07/15/2020: 1153ft; 09/03/2020: 839ft; 11/05/2020: 985ft    Time 8    Period Weeks    Status On-going      PT LONG TERM GOAL #7   Title Patient will improve her 5xsts to under 15 sec to indicate singificnat improvement in LE functional  strength and decrease in fall risk.    Baseline 24.5 sec; 03/20/2020: 19.19 sec; 06/13/2020: 14. 92    Time 6    Period Weeks    Status Achieved      PT LONG TERM GOAL #8   Title Patient will improve FGA to 28/30 to indicate improvement in dynamic balance and improvement in stability.    Baseline 23/30; 06/11/2020: 25/30; 07/15/2020: 26/30; 09/02/2020: 27/30; 11/05/2020: 28/30    Time 6    Period Weeks    Status Achieved      PT LONG TERM GOAL  #9   TITLE Patient will be able to ascend/desend the stairs without use of UEs to increase safety and decrease fall risk with stair use.    Baseline requires use of UEs    Time 6    Period Weeks    Status New                 Plan - 01/01/21 1444    Clinical Impression Statement Less dizziness noted throughout today's session as well as less episodes of LOB. The only time with a LOB noted was during step down off of 6". This was secondary to a decreased step length off of the step and less from dynamic balance limitations. Patient is improving overall with ability to perform greater amount of repetitions with less dizziness. Patient will benefit from further skilled therapy to return to prior level of funciton.    Personal Factors and Comorbidities Age;Comorbidity 3+;Time since onset of injury/illness/exacerbation;Comorbidity 2;Other    Comorbidities MCI, HTN, CA, cataracts    Examination-Activity Limitations Bend;Sit;Squat;Stairs;Stand;Sleep    Examination-Participation Restrictions Cleaning;Community Activity    Stability/Clinical Decision Making Evolving/Moderate complexity    Rehab Potential Fair    Clinical Impairments Affecting Rehab Potential MCI    PT Frequency 2x / week  PT Duration 6 weeks    PT Treatment/Interventions ADLs/Self Care Home Management;Therapeutic exercise;Patient/family education;Neuromuscular re-education;Therapeutic activities;Functional mobility training;Balance training;Manual techniques;Gait training;Stair  training;Moist Heat;Electrical Stimulation;Cryotherapy;Taping;Dry needling;Energy conservation;Passive range of motion;Joint Manipulations    PT Next Visit Plan Progress glute therex, pelvic-hip dissociation; check and progress HEP with stretches    PT Home Exercise Plan lateral step ups, step ups, hip kickers ext/abd red theraband    Consulted and Agree with Plan of Care Patient           Patient will benefit from skilled therapeutic intervention in order to improve the following deficits and impairments:  Decreased range of motion,Postural dysfunction,Pain,Abnormal gait,Decreased activity tolerance,Decreased coordination,Decreased endurance,Decreased balance,Decreased mobility,Difficulty walking,Hypomobility,Increased muscle spasms,Impaired perceived functional ability,Decreased strength,Impaired flexibility  Visit Diagnosis: Muscle weakness (generalized)  Difficulty in walking, not elsewhere classified  Pain in left hip  Pain in right hip     Problem List Patient Active Problem List   Diagnosis Date Noted  . Osteopenia of neck of right femur 09/06/2020  . Class 1 obesity with serious comorbidity and body mass index (BMI) of 32.0 to 32.9 in adult 08/29/2020  . Candidal skin infection 04/29/2020  . Parkinson disease ([place]) 02/22/2020  . 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 Bipolar disorder, in full remission, most recent episode depressed ([place]) 11/08/2019  . Avitaminosis D 08/24/2019  . Insomnia due to mental condition 05/31/2019  . Bipolar I disorder, most recent episode depressed ([place]) 05/31/2019  . Cognitive disorder 05/31/2019  . Alcohol use disorder, moderate, in sustained remission ([place]) 05/31/2019  . Elevated tumor markers 05/27/2019  . GAD (generalized anxiety disorder) 04/10/2019  . MDD (major depressive disorder), severe ([place]) 01/31/2019  . High serum carbohydrate antigen 19-9 (CA19-9) 12/01/2018  . Adjustment disorder with anxious mood 11/24/2018  . Essential  hypertension 11/24/2018  . B12 deficiency 10/17/2018  . Incisional hernia, without obstruction or gangrene 09/12/2018  . Genetic testing 04/07/2018  . Malignant neoplasm of upper-outer quadrant of right breast in female, estrogen receptor positive ([place]) 03/17/2018  . Malignant neoplasm of upper-inner quadrant of right breast in female, estrogen receptor positive ([place]) 03/17/2018  . History of psychosis 02/07/2018  . History of insomnia 02/07/2018    Blythe Stanford, PT DPT 01/01/2021, 3:06 PM  [facility] [address], [place], [address] Phone: [phone]   Fax:  [phone]  Name: Kendra [name] MRN: [id number] Date of Birth: 12/03/1945

## 2021-01-06 ENCOUNTER — Other Ambulatory Visit: Payer: Self-pay

## 2021-01-06 ENCOUNTER — Ambulatory Visit: Payer: Medicare PPO

## 2021-01-06 DIAGNOSIS — M25522 Pain in left elbow: Secondary | ICD-10-CM | POA: Diagnosis not present

## 2021-01-06 DIAGNOSIS — M25622 Stiffness of left elbow, not elsewhere classified: Secondary | ICD-10-CM | POA: Diagnosis not present

## 2021-01-06 DIAGNOSIS — R262 Difficulty in walking, not elsewhere classified: Secondary | ICD-10-CM | POA: Diagnosis not present

## 2021-01-06 DIAGNOSIS — M6281 Muscle weakness (generalized): Secondary | ICD-10-CM | POA: Diagnosis not present

## 2021-01-06 DIAGNOSIS — M25551 Pain in right hip: Secondary | ICD-10-CM | POA: Diagnosis not present

## 2021-01-06 DIAGNOSIS — M25552 Pain in left hip: Secondary | ICD-10-CM | POA: Diagnosis not present

## 2021-01-06 NOTE — Therapy (Signed)
[facility] [address] [place], [address] Phone: [phone]   Fax:  [phone]  Physical Therapy Treatment  Patient Details  Name: Kendra [name] MRN: [id number] Date of Birth: [birth year] No data recorded  Encounter Date: 01/06/2021   PT End of Session - 01/06/21 1310    Visit Number 88    Number of Visits 90    Date for PT Re-Evaluation 01/01/21    Authorization Type Recert at visit 35    PT Start Time 1300    PT Stop Time 1345    PT Time Calculation (min) 45 min    Equipment Utilized During Treatment Gait belt    Activity Tolerance Patient tolerated treatment well;No increased pain    Behavior During Therapy WFL for tasks assessed/performed           Past Medical History:  Diagnosis Date  . Anxiety   . Breast cancer ([place]) 03/2018   right breast cancer at 11:00 and 1:00  . Bursitis    bilateral hips and knees  . Complication of anesthesia   . Depression   . Family history of breast cancer 03/24/2018  . Family history of lung cancer 03/24/2018  . History of alcohol dependence ([place]) 2007   no ETOH; resolved since 2007  . History of hiatal hernia   . History of psychosis 2014   due to sleep disturbance  . Hypertension   . Parkinson disease ([place])   . Personal history of radiation therapy 06/2018-07/2018   right breast ca  . PONV (postoperative nausea and vomiting)     Past Surgical History:  Procedure Laterality Date  . BREAST BIOPSY Right 03/14/2018   11:00 DCIS and invasive ductal carcinoma  . BREAST BIOPSY Right 03/14/2018   1:00 Invasive ductal carcinoma  . BREAST LUMPECTOMY Right 04/27/2018   lumpectomy of 11 and 1:00 cancers, clear margins, negative LN  . BREAST LUMPECTOMY WITH RADIOACTIVE SEED AND SENTINEL LYMPH NODE BIOPSY Right 04/27/2018   Procedure: RIGHT BREAST LUMPECTOMY WITH RADIOACTIVE SEED X 2 AND RIGHT SENTINEL LYMPH NODE BIOPSY;  Surgeon: [name], MD;   Location: [facility];  Service: General;  Laterality: Right;  . CATARACT EXTRACTION W/PHACO Left 08/30/2019   Procedure: CATARACT EXTRACTION PHACO AND INTRAOCULAR LENS PLACEMENT ([place]) LEFT panoptix toric  01:20.5  12.7%  10.26;  Surgeon: Leandrew Koyanagi, MD;  Location: [facility];  Service: Ophthalmology;  Laterality: Left;  . CATARACT EXTRACTION W/PHACO Right 09/20/2019   Procedure: CATARACT EXTRACTION PHACO AND INTRAOCULAR LENS PLACEMENT ([facility]) RIGHT 5.71  00:36.4  15.7%;  Surgeon: Leandrew Koyanagi, MD;  Location: [facility];  Service: Ophthalmology;  Laterality: Right;  . LAPAROTOMY N/A 02/10/2018   Procedure: EXPLORATORY LAPAROTOMY;  Surgeon: Isabel Caprice, MD;  Location: [facility];  Service: Gynecology;  Laterality: N/A;  . MASS EXCISION  01/2018   abdominal  . OVARIAN CYST REMOVAL    . SALPINGOOPHORECTOMY Bilateral 02/10/2018   Procedure: BILATERAL SALPINGO OOPHORECTOMY; PERITONEAL WASHINGS;  Surgeon: Isabel Caprice, MD;  Location: [facility];  Service: Gynecology;  Laterality: Bilateral;  . TONSILLECTOMY    . TONSILLECTOMY AND ADENOIDECTOMY  1953    There were no vitals filed for this visit.   Subjective Assessment - 01/06/21 1307    Subjective Patient states her brain "feels clear" today however reports she still feels like some symptoms of dizziness. Patient states she has continues to have less dizziness symptoms.    Pertinent History Patient  report unclear regarding onset. Visited MD on Sept 16 and received injection with relief for approximately 1 month. Reports she cannot receive an additional injection pending eye surgery. Received dx from [facility] of hip bursitis in B hips. Reports pain with walking, stanindg, sitting 5/10 current 7/10 worst 3/10 best, with temporary relief when she is sitting "just right" and with naproxen. Agg: activity. Cormorbidities: hx of breast cancer, HTN, MCI, insomnia, cataracts    Limitations Sitting;Standing;Walking;House  hold activities    How long can you sit comfortably? Pain constant, reduced with neutral pelvic tilt    How long can you stand comfortably? Pain constant, increases with time    How long can you walk comfortably? Pain constant, increases with time    Patient Stated Goals Be able to move without hurting, walk 1 hour    Currently in Pain? No/denies              TREATMENT Therapeutic Exercise Wall rolls in standing - x 10 Backwards walking with reaching for cones - 4 x 22f Seated reach out, up back and down - x 20 Soccer kicks in standing - 5 min in standing Step ups onto 6" - x 10 B  Side stepping up and over 6" step - x5 B Side stepping across airex beam - x5 (down and back)   Performing exercises to address pain and spasms       PT Education - 01/06/21 1309    Education provided Yes    Education Details form/technique with exercise    Person(s) Educated Patient    Methods Explanation;Demonstration    Comprehension Verbalized understanding;Returned demonstration            PT Short Term Goals - 09/12/19 1419      PT SHORT TERM GOAL #1   Title Patient will demonstrate complaince with HEP to speed recovery and reduce total number of visits.    Baseline HEP given    Time 2    Period Weeks    Status New    Target Date 09/27/19             PT Long Term Goals - 12/18/20 1140      PT LONG TERM GOAL #1   Title Patient will improve hip strength to 4/5 grossly to facilitate improved mobility with ADLs.    Baseline 3, 4-; 12/22 deferred to [place]; 11/09/2019: 4-/5; 12/07/2019 L hip flexor 4, abd and ext 3+; 01/29/2020: 4/5 for most hip motions, 3+/5 for abd, ext; 03/20/2020: 4/5 for hip movements    Time 8    Period Weeks    Status Achieved      PT LONG TERM GOAL #2   Title Patient will demonstrate walking tolerance increased to 30 minutes without increase in symptoms to demonstrate improved ability to perform walking program for cardiovascular health.    Baseline 10  minute tolerance (patient report); 12/22 10 minutes (per pt report); 11/09/2019: 7 min; 12/07/2019 unable to assess due to icy streets;  01/29/2020: 10 min; 03/20/2020 10 min; 05/14/2020: 10 min x 3 per day; 06/11/2020: 162m x 3 per day; 07/15/2020: 1017mx 3 per day; 09/02/2020: 86m43m 3 (just standing rest breaks in between); 11/06/2019: 10 min x 3    Time 8    Period Weeks    Status Partially Met      PT LONG TERM GOAL #3   Title Patient will report worst pain of 3/10 during therapy to demonstrate reduced disability  Baseline Worst 7/10; 12/22 worst 9/10; 11/09/2019: 9/10; 12/07/2019 7/10; 01/29/2020: 7/10; 05/14/2020: 0/ 10    Time 8    Period Weeks    Status Achieved      PT LONG TERM GOAL #4   Title Patient will demonstrate ability to perform all bed mobility maneuvers without increase in pain for reduced disability with mobility and transfers with ADLs.    Baseline Reports pain with bed mobility supine <> sidelying L/R, sidelying to sitting EOB; 12/22 pt able to perform I without increased pain    Time 8    Period Weeks    Status Achieved      PT LONG TERM GOAL #5   Title Patient will improve 10MW speed to 1.0 m/sec to meet norms for minimal fall risk and full community ambulation.    Baseline 0.88 m/sec; 12/22 0.96 m/sec; 11/09/2019: deferred; 12/07/2019 0.90 m/sec; 01/29/2020: .9  m/sec; 03/20/2020: .76ms; 05/14/2020: . 9 m/s; 06/11/2020: .932m; 07/15/2020: 1 m/s    Time 8    Period Weeks    Status Achieved      PT LONG TERM GOAL #6   Title Patient will increase 6MWT by 200 ft to demonstrate improvement in functional capacity for transition to cardiac walking program    Baseline 6MWT = 1195; 1/7/20201: deferred; 12/07/2019 1095; 01/29/2020: deferred secondary to fatigue; 03/20/2020: 107016f9/13/2021: 1130f18f1/12/2019: 800ft43f4/2022: 973ft 3fime 8    Period Weeks    Status On-going      PT LONG TERM GOAL #7   Title Patient will improve her 5xsts to under 15 sec to indicate singificnat  improvement in LE functional strength and decrease in fall risk.    Baseline 24.5 sec; 03/20/2020: 19.19 sec; 06/13/2020: 14. 92    Time 6    Period Weeks    Status Achieved      PT LONG TERM GOAL #8   Title Patient will improve FGA to 28/30 to indicate improvement in dynamic balance and improvement in stability.    Baseline 23/30; 06/11/2020: 25/30; 07/15/2020: 26/30; 09/02/2020: 27/30; 11/05/2020: 28/30    Time 6    Period Weeks    Status Achieved      PT LONG TERM GOAL  #9   TITLE Patient will be able to ascend/desend the stairs without use of UEs to increase safety and decrease fall risk with stair use.    Baseline requires use of UEs    Time 6    Period Weeks    Status New                 Plan - 01/06/21 1332    Clinical Impression Statement Patient able to tolerate greater amount of step ups without LOB indicating functional carryover between therapy sessions. Although patient is improving, she continued to have LOB requiring tactile/verbal cueing to correct most notably with performing backwards amb indicating poor vestibular function. Although this is limited it is improved after verbal cueing to corect weight shifting on from LOB backwards. Patient will benefit from further skilled therapy focused on improving functional capacity.    Personal Factors and Comorbidities Age;Comorbidity 3+;Time since onset of injury/illness/exacerbation;Comorbidity 2;Other    Comorbidities MCI, HTN, CA, cataracts    Examination-Activity Limitations Bend;Sit;Squat;Stairs;Stand;Sleep    Examination-Participation Restrictions Cleaning;Community Activity    Stability/Clinical Decision Making Evolving/Moderate complexity    Rehab Potential Fair    Clinical Impairments Affecting Rehab Potential MCI    PT Frequency 2x / week    PT  Duration 6 weeks    PT Treatment/Interventions ADLs/Self Care Home Management;Therapeutic exercise;Patient/family education;Neuromuscular re-education;Therapeutic  activities;Functional mobility training;Balance training;Manual techniques;Gait training;Stair training;Moist Heat;Electrical Stimulation;Cryotherapy;Taping;Dry needling;Energy conservation;Passive range of motion;Joint Manipulations    PT Next Visit Plan Progress glute therex, pelvic-hip dissociation; check and progress HEP with stretches    PT Home Exercise Plan lateral step ups, step ups, hip kickers ext/abd red theraband    Consulted and Agree with Plan of Care Patient           Patient will benefit from skilled therapeutic intervention in order to improve the following deficits and impairments:  Decreased range of motion,Postural dysfunction,Pain,Abnormal gait,Decreased activity tolerance,Decreased coordination,Decreased endurance,Decreased balance,Decreased mobility,Difficulty walking,Hypomobility,Increased muscle spasms,Impaired perceived functional ability,Decreased strength,Impaired flexibility  Visit Diagnosis: Muscle weakness (generalized)  Difficulty in walking, not elsewhere classified     Problem List Patient Active Problem List   Diagnosis Date Noted  . Osteopenia of neck of right femur 09/06/2020  . Class 1 obesity with serious comorbidity and body mass index (BMI) of 32.0 to 32.9 in adult 08/29/2020  . Candidal skin infection 04/29/2020  . Parkinson disease ([place]) 02/22/2020  . 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 Bipolar disorder, in full remission, most recent episode depressed ([place]) 11/08/2019  . Avitaminosis D 08/24/2019  . Insomnia due to mental condition 05/31/2019  . Bipolar I disorder, most recent episode depressed ([place]) 05/31/2019  . Cognitive disorder 05/31/2019  . Alcohol use disorder, moderate, in sustained remission ([place]) 05/31/2019  . Elevated tumor markers 05/27/2019  . GAD (generalized anxiety disorder) 04/10/2019  . MDD (major depressive disorder), severe ([place]) 01/31/2019  . High serum carbohydrate antigen 19-9 (CA19-9) 12/01/2018  . Adjustment  disorder with anxious mood 11/24/2018  . Essential hypertension 11/24/2018  . B12 deficiency 10/17/2018  . Incisional hernia, without obstruction or gangrene 09/12/2018  . Genetic testing 04/07/2018  . Malignant neoplasm of upper-outer quadrant of right breast in female, estrogen receptor positive ([place]) 03/17/2018  . Malignant neoplasm of upper-inner quadrant of right breast in female, estrogen receptor positive ([place]) 03/17/2018  . History of psychosis 02/07/2018  . History of insomnia 02/07/2018    Blythe Stanford, PT DPT 01/06/2021, 1:44 PM  [facility] [address] [place], [address] Phone: [phone]   Fax:  [phone]  Name: Kendra [name] MRN: [id number] Date of Birth: 1946-08-16

## 2021-01-09 ENCOUNTER — Ambulatory Visit: Payer: Medicare PPO

## 2021-01-09 ENCOUNTER — Other Ambulatory Visit: Payer: Self-pay

## 2021-01-09 DIAGNOSIS — M6281 Muscle weakness (generalized): Secondary | ICD-10-CM

## 2021-01-09 DIAGNOSIS — M25522 Pain in left elbow: Secondary | ICD-10-CM | POA: Diagnosis not present

## 2021-01-09 DIAGNOSIS — M25552 Pain in left hip: Secondary | ICD-10-CM | POA: Diagnosis not present

## 2021-01-09 DIAGNOSIS — M25622 Stiffness of left elbow, not elsewhere classified: Secondary | ICD-10-CM | POA: Diagnosis not present

## 2021-01-09 DIAGNOSIS — M25551 Pain in right hip: Secondary | ICD-10-CM | POA: Diagnosis not present

## 2021-01-09 DIAGNOSIS — R262 Difficulty in walking, not elsewhere classified: Secondary | ICD-10-CM

## 2021-01-09 NOTE — Therapy (Signed)
[facility] [address] [place], [address] Phone: [phone]   Fax:  [phone]  Physical Therapy Treatment  Patient Details  Name: Kendra [name] MRN: [id number] Date of Birth: [birth year] No data recorded  Encounter Date: 01/09/2021   PT End of Session - 01/09/21 1008    Visit Number 89    Number of Visits 90    Date for PT Re-Evaluation 01/01/21    Authorization Type Recert at visit 25    PT Start Time 0945    PT Stop Time 1030    PT Time Calculation (min) 45 min    Equipment Utilized During Treatment Gait belt    Activity Tolerance Patient tolerated treatment well;No increased pain    Behavior During Therapy WFL for tasks assessed/performed           Past Medical History:  Diagnosis Date  . Anxiety   . Breast cancer ([place]) 03/2018   right breast cancer at 11:00 and 1:00  . Bursitis    bilateral hips and knees  . Complication of anesthesia   . Depression   . Family history of breast cancer 03/24/2018  . Family history of lung cancer 03/24/2018  . History of alcohol dependence ([place]) 2007   no ETOH; resolved since 2007  . History of hiatal hernia   . History of psychosis 2014   due to sleep disturbance  . Hypertension   . Parkinson disease ([place])   . Personal history of radiation therapy 06/2018-07/2018   right breast ca  . PONV (postoperative nausea and vomiting)     Past Surgical History:  Procedure Laterality Date  . BREAST BIOPSY Right 03/14/2018   11:00 DCIS and invasive ductal carcinoma  . BREAST BIOPSY Right 03/14/2018   1:00 Invasive ductal carcinoma  . BREAST LUMPECTOMY Right 04/27/2018   lumpectomy of 11 and 1:00 cancers, clear margins, negative LN  . BREAST LUMPECTOMY WITH RADIOACTIVE SEED AND SENTINEL LYMPH NODE BIOPSY Right 04/27/2018   Procedure: RIGHT BREAST LUMPECTOMY WITH RADIOACTIVE SEED X 2 AND RIGHT SENTINEL LYMPH NODE BIOPSY;  Surgeon: [name], MD;   Location: [facility];  Service: General;  Laterality: Right;  . CATARACT EXTRACTION W/PHACO Left 08/30/2019   Procedure: CATARACT EXTRACTION PHACO AND INTRAOCULAR LENS PLACEMENT ([place]) LEFT panoptix toric  01:20.5  12.7%  10.26;  Surgeon: Leandrew Koyanagi, MD;  Location: [facility];  Service: Ophthalmology;  Laterality: Left;  . CATARACT EXTRACTION W/PHACO Right 09/20/2019   Procedure: CATARACT EXTRACTION PHACO AND INTRAOCULAR LENS PLACEMENT ([facility]) RIGHT 5.71  00:36.4  15.7%;  Surgeon: Leandrew Koyanagi, MD;  Location: [facility];  Service: Ophthalmology;  Laterality: Right;  . LAPAROTOMY N/A 02/10/2018   Procedure: EXPLORATORY LAPAROTOMY;  Surgeon: Isabel Caprice, MD;  Location: [facility];  Service: Gynecology;  Laterality: N/A;  . MASS EXCISION  01/2018   abdominal  . OVARIAN CYST REMOVAL    . SALPINGOOPHORECTOMY Bilateral 02/10/2018   Procedure: BILATERAL SALPINGO OOPHORECTOMY; PERITONEAL WASHINGS;  Surgeon: Isabel Caprice, MD;  Location: [facility];  Service: Gynecology;  Laterality: Bilateral;  . TONSILLECTOMY    . TONSILLECTOMY AND ADENOIDECTOMY  1953    There were no vitals filed for this visit.   Subjective Assessment - 01/09/21 0955    Subjective Patient states her husband states she looked more well rested compared to previous days. Patient states no dizziness at time of arrival .    Pertinent History Patient report unclear regarding onset.  Visited MD on Sept 16 and received injection with relief for approximately 1 month. Reports she cannot receive an additional injection pending eye surgery. Received dx from MD of hip bursitis in B hips. Reports pain with walking, stanindg, sitting 5/10 current 7/10 worst 3/10 best, with temporary relief when she is sitting "just right" and with naproxen. Agg: activity. Cormorbidities: hx of breast cancer, HTN, MCI, insomnia, cataracts    Limitations Sitting;Standing;Walking;House hold activities    How long can you  sit comfortably? Pain constant, reduced with neutral pelvic tilt    How long can you stand comfortably? Pain constant, increases with time    How long can you walk comfortably? Pain constant, increases with time    Patient Stated Goals Be able to move without hurting, walk 1 hour    Currently in Pain? No/denies                      TREATMENT Neuromuscular rehab Seated reach out, up back and down - x 20 Backwards walking with reaching for cones - 4 x 30f Side steppig with EC - 4 x 331fCone pick ups off of the floor - x 10  Wall rolls in standing - x 10 Tandem walking - [id number]   Performing exercises to address pain and spasms     PT Education - 01/09/21 1008    Education provided Yes    Education Details form/technique with exercise    Person(s) Educated Patient    Methods Explanation;Demonstration    Comprehension Returned demonstration;Verbalized understanding            PT Short Term Goals - 09/12/19 1419      PT SHORT TERM GOAL #1   Title Patient will demonstrate complaince with HEP to speed recovery and reduce total number of visits.    Baseline HEP given    Time 2    Period Weeks    Status New    Target Date 09/27/19             PT Long Term Goals - 12/18/20 1140      PT LONG TERM GOAL #1   Title Patient will improve hip strength to 4/5 grossly to facilitate improved mobility with ADLs.    Baseline 3, 4-; 12/22 deferred to NV; 11/09/2019: 4-/5; 12/07/2019 L hip flexor 4, abd and ext 3+; 01/29/2020: 4/5 for most hip motions, 3+/5 for abd, ext; 03/20/2020: 4/5 for hip movements    Time 8    Period Weeks    Status Achieved      PT LONG TERM GOAL #2   Title Patient will demonstrate walking tolerance increased to 30 minutes without increase in symptoms to demonstrate improved ability to perform walking program for cardiovascular health.    Baseline 10 minute tolerance (patient report); 12/22 10 minutes (per pt report); 11/09/2019: 7 min; 12/07/2019  unable to assess due to icy streets;  01/29/2020: 10 min; 03/20/2020 10 min; 05/14/2020: 10 min x 3 per day; 06/11/2020: 51m84m 3 per day; 07/15/2020: 51mi36m3 per day; 09/02/2020: 51min58m (just standing rest breaks in between); 11/06/2019: 10 min x 3    Time 8    Period Weeks    Status Partially Met      PT LONG TERM GOAL #3   Title Patient will report worst pain of 3/10 during therapy to demonstrate reduced disability    Baseline Worst 7/10; 12/22 worst 9/10; 11/09/2019: 9/10; 12/07/2019 7/10; 01/29/2020: 7/10;  05/14/2020: 0/ 10    Time 8    Period Weeks    Status Achieved      PT LONG TERM GOAL #4   Title Patient will demonstrate ability to perform all bed mobility maneuvers without increase in pain for reduced disability with mobility and transfers with ADLs.    Baseline Reports pain with bed mobility supine <> sidelying L/R, sidelying to sitting EOB; 12/22 pt able to perform I without increased pain    Time 8    Period Weeks    Status Achieved      PT LONG TERM GOAL #5   Title Patient will improve 10MW speed to 1.0 m/sec to meet norms for minimal fall risk and full community ambulation.    Baseline 0.88 m/sec; 12/22 0.96 m/sec; 11/09/2019: deferred; 12/07/2019 0.90 m/sec; 01/29/2020: .9  m/sec; 03/20/2020: .70ms; 05/14/2020: . 9 m/s; 06/11/2020: .921m; 07/15/2020: 1 m/s    Time 8    Period Weeks    Status Achieved      PT LONG TERM GOAL #6   Title Patient will increase 6MWT by 200 ft to demonstrate improvement in functional capacity for transition to cardiac walking program    Baseline 6MWT = 1195; 1/7/20201: deferred; 12/07/2019 1095; 01/29/2020: deferred secondary to fatigue; 03/20/2020: [id number]/13/2021: 1130f28f1/12/2019: 800ft38f4/2022: 973ft 1fime 8    Period Weeks    Status On-going      PT LONG TERM GOAL #7   Title Patient will improve her 5xsts to under 15 sec to indicate singificnat improvement in LE functional strength and decrease in fall risk.    Baseline 24.5 sec; 03/20/2020: 19.19  sec; 06/13/2020: 14. 92    Time 6    Period Weeks    Status Achieved      PT LONG TERM GOAL #8   Title Patient will improve FGA to 28/30 to indicate improvement in dynamic balance and improvement in stability.    Baseline 23/30; 06/11/2020: 25/30; 07/15/2020: 26/30; 09/02/2020: 27/30; 11/05/2020: 28/30    Time 6    Period Weeks    Status Achieved      PT LONG TERM GOAL  #9   TITLE Patient will be able to ascend/desend the stairs without use of UEs to increase safety and decrease fall risk with stair use.    Baseline requires use of UEs    Time 6    Period Weeks    Status New                 Plan - 01/09/21 1009    Clinical Impression Statement Challenged patient by performing greater amount of EC based exercises during today's session. Less LOB with backwards walking today, however patient continues to demonstrate greater LOB after fatigued, most notably with EC walking. Patient is making improvements and will benefit from further skilled therapy to return to prior level of function.    Personal Factors and Comorbidities Age;Comorbidity 3+;Time since onset of injury/illness/exacerbation;Comorbidity 2;Other    Comorbidities MCI, HTN, CA, cataracts    Examination-Activity Limitations Bend;Sit;Squat;Stairs;Stand;Sleep    Examination-Participation Restrictions Cleaning;Community Activity    Stability/Clinical Decision Making Evolving/Moderate complexity    Rehab Potential Fair    Clinical Impairments Affecting Rehab Potential MCI    PT Frequency 2x / week    PT Duration 6 weeks    PT Treatment/Interventions ADLs/Self Care Home Management;Therapeutic exercise;Patient/family education;Neuromuscular re-education;Therapeutic activities;Functional mobility training;Balance training;Manual techniques;Gait training;Stair training;Moist Heat;Electrical Stimulation;Cryotherapy;Taping;Dry needling;Energy conservation;Passive range of motion;Joint Manipulations  PT Next Visit Plan Progress glute  therex, pelvic-hip dissociation; check and progress HEP with stretches    PT Home Exercise Plan lateral step ups, step ups, hip kickers ext/abd red theraband    Consulted and Agree with Plan of Care Patient           Patient will benefit from skilled therapeutic intervention in order to improve the following deficits and impairments:  Decreased range of motion,Postural dysfunction,Pain,Abnormal gait,Decreased activity tolerance,Decreased coordination,Decreased endurance,Decreased balance,Decreased mobility,Difficulty walking,Hypomobility,Increased muscle spasms,Impaired perceived functional ability,Decreased strength,Impaired flexibility  Visit Diagnosis: Difficulty in walking, not elsewhere classified  Muscle weakness (generalized)     Problem List Patient Active Problem List   Diagnosis Date Noted  . Osteopenia of neck of right femur 09/06/2020  . Class 1 obesity with serious comorbidity and body mass index (BMI) of 32.0 to 32.9 in adult 08/29/2020  . Candidal skin infection 04/29/2020  . Parkinson disease ([place]) 02/22/2020  . 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 Bipolar disorder, in full remission, most recent episode depressed ([place]) 11/08/2019  . Avitaminosis D 08/24/2019  . Insomnia due to mental condition 05/31/2019  . Bipolar I disorder, most recent episode depressed ([place]) 05/31/2019  . Cognitive disorder 05/31/2019  . Alcohol use disorder, moderate, in sustained remission ([place]) 05/31/2019  . Elevated tumor markers 05/27/2019  . GAD (generalized anxiety disorder) 04/10/2019  . MDD (major depressive disorder), severe ([place]) 01/31/2019  . High serum carbohydrate antigen 19-9 (CA19-9) 12/01/2018  . Adjustment disorder with anxious mood 11/24/2018  . Essential hypertension 11/24/2018  . B12 deficiency 10/17/2018  . Incisional hernia, without obstruction or gangrene 09/12/2018  . Genetic testing 04/07/2018  . Malignant neoplasm of upper-outer quadrant of right breast in female,  estrogen receptor positive ([place]) 03/17/2018  . Malignant neoplasm of upper-inner quadrant of right breast in female, estrogen receptor positive ([place]) 03/17/2018  . History of psychosis 02/07/2018  . History of insomnia 02/07/2018    Blythe Stanford 01/09/2021, 10:21 AM  [facility] [address] [place], [address] Phone: [phone]   Fax:  [phone]  Name: Kendra [name] MRN: [id number] Date of Birth: [birth year]

## 2021-01-13 ENCOUNTER — Ambulatory Visit: Payer: Medicare PPO

## 2021-01-13 ENCOUNTER — Other Ambulatory Visit: Payer: Self-pay

## 2021-01-13 DIAGNOSIS — M25522 Pain in left elbow: Secondary | ICD-10-CM | POA: Diagnosis not present

## 2021-01-13 DIAGNOSIS — M25552 Pain in left hip: Secondary | ICD-10-CM

## 2021-01-13 DIAGNOSIS — M6281 Muscle weakness (generalized): Secondary | ICD-10-CM | POA: Diagnosis not present

## 2021-01-13 DIAGNOSIS — M25551 Pain in right hip: Secondary | ICD-10-CM | POA: Diagnosis not present

## 2021-01-13 DIAGNOSIS — M25622 Stiffness of left elbow, not elsewhere classified: Secondary | ICD-10-CM | POA: Diagnosis not present

## 2021-01-13 DIAGNOSIS — R262 Difficulty in walking, not elsewhere classified: Secondary | ICD-10-CM

## 2021-01-14 NOTE — Therapy (Signed)
[facility] [address] [place], , [address] Phone: [phone]   Fax:  [phone]  Physical Therapy Treatment/ Progress Note  Reporting Period  12/18/2020-01/13/2021 Patient Details  Name: Kendra [name] MRN: [id number] Date of Birth: 06/02/1946 No data recorded  Encounter Date: 01/13/2021   PT End of Session - 01/13/21 1310    Visit Number 90    Number of Visits 90    Date for PT Re-Evaluation 01/01/21    Authorization Type Recert at visit 90    PT Start Time 1304    PT Stop Time 1345    PT Time Calculation (min) 41 min    Equipment Utilized During Treatment Gait belt    Activity Tolerance Patient tolerated treatment well;No increased pain    Behavior During Therapy WFL for tasks assessed/performed           Past Medical History:  Diagnosis Date  . Anxiety   . Breast cancer (HCC) 03/2018   right breast cancer at 11:00 and 1:00  . Bursitis    bilateral hips and knees  . Complication of anesthesia   . Depression   . Family history of breast cancer 03/24/2018  . Family history of lung cancer 03/24/2018  . History of alcohol dependence (HCC) 2007   no ETOH; resolved since 2007  . History of hiatal hernia   . History of psychosis 2014   due to sleep disturbance  . Hypertension   . Parkinson disease (HCC)   . Personal history of radiation therapy 06/2018-07/2018   right breast ca  . PONV (postoperative nausea and vomiting)     Past Surgical History:  Procedure Laterality Date  . BREAST BIOPSY Right 03/14/2018   11:00 DCIS and invasive ductal carcinoma  . BREAST BIOPSY Right 03/14/2018   1:00 Invasive ductal carcinoma  . BREAST LUMPECTOMY Right 04/27/2018   lumpectomy of 11 and 1:00 cancers, clear margins, negative LN  . BREAST LUMPECTOMY WITH RADIOACTIVE SEED AND SENTINEL LYMPH NODE BIOPSY Right 04/27/2018   Procedure: RIGHT BREAST LUMPECTOMY WITH RADIOACTIVE SEED X 2 AND RIGHT SENTINEL LYMPH  NODE BIOPSY;  Surgeon: Hoxworth, Benjamin, MD;  Location:  SURGERY CENTER;  Service: General;  Laterality: Right;  . CATARACT EXTRACTION W/PHACO Left 08/30/2019   Procedure: CATARACT EXTRACTION PHACO AND INTRAOCULAR LENS PLACEMENT (IOC) LEFT panoptix toric  01:20.5  12.7%  10.26;  Surgeon: Brasington, Chadwick, MD;  Location: [facility];  Service: Ophthalmology;  Laterality: Left;  . CATARACT EXTRACTION W/PHACO Right 09/20/2019   Procedure: CATARACT EXTRACTION PHACO AND INTRAOCULAR LENS PLACEMENT (IOC) RIGHT 5.71  00:36.4  15.7%;  Surgeon: Brasington, Chadwick, MD;  Location: [facility];  Service: Ophthalmology;  Laterality: Right;  . LAPAROTOMY N/A 02/10/2018   Procedure: EXPLORATORY LAPAROTOMY;  Surgeon: Phelps, Shawna B, MD;  Location: [facility];  Service: Gynecology;  Laterality: N/A;  . MASS EXCISION  01/2018   abdominal  . OVARIAN CYST REMOVAL    . SALPINGOOPHORECTOMY Bilateral 02/10/2018   Procedure: BILATERAL SALPINGO OOPHORECTOMY; PERITONEAL WASHINGS;  Surgeon: Phelps, Shawna B, MD;  Location: [facility];  Service: Gynecology;  Laterality: Bilateral;  . TONSILLECTOMY    . TONSILLECTOMY AND ADENOIDECTOMY  1953    There were no vitals filed for this visit.   Subjective Assessment - 01/13/21 1307    Subjective Patient states she has had increased achiness along her legs and low back. Patient states it has bothered her around 2 of the past 4 days. Patient   states she has not been as dizziness in the past.    Pertinent History Patient report unclear regarding onset. Visited MD on Sept 16 and received injection with relief for approximately 1 month. Reports she cannot receive an additional injection pending eye surgery. Received dx from MD of hip bursitis in B hips. Reports pain with walking, stanindg, sitting 5/10 current 7/10 worst 3/10 best, with temporary relief when she is sitting "just right" and with naproxen. Agg: activity. Cormorbidities: hx of breast cancer, HTN,  MCI, insomnia, cataracts    Limitations Sitting;Standing;Walking;House hold activities    How long can you sit comfortably? Pain constant, reduced with neutral pelvic tilt    How long can you stand comfortably? Pain constant, increases with time    How long can you walk comfortably? Pain constant, increases with time    Patient Stated Goals Be able to move without hurting, walk 1 hour    Currently in Pain? No/denies              TREATMENT Therapeutic Exercise Ambulation with focus on improving speed and endurance for 6 min Sit to stands with walking - x 5 (around 20ft in walking) Sitting reach out, reach down, up and back - x 20  Stairs ascending/descending - 2 x 4   Performed exercises to improve balance Neuromuscular Rehab Wall rolls against wall - x8 Head turns with waliing - x 100ft  Performed to assist with LOB --     PT Education - 01/13/21 1309    Education provided Yes    Education Details form/technique with exercise    Person(s) Educated Patient    Methods Explanation;Demonstration    Comprehension Returned demonstration;Verbalized understanding            PT Short Term Goals - 01/14/21 1029      PT SHORT TERM GOAL #1   Title Patient will demonstrate complaince with HEP to speed recovery and reduce total number of visits.    Baseline HEP given; 01/14/2021: Independent with HEP    Time 2    Period Weeks    Status Achieved    Target Date 09/27/19             PT Long Term Goals - 01/13/21 1328      PT LONG TERM GOAL #1   Title Patient will improve hip strength to 4/5 grossly to facilitate improved mobility with ADLs.    Baseline 3, 4-; 12/22 deferred to NV; 11/09/2019: 4-/5; 12/07/2019 L hip flexor 4, abd and ext 3+; 01/29/2020: 4/5 for most hip motions, 3+/5 for abd, ext; 03/20/2020: 4/5 for hip movements    Time 8    Period Weeks    Status Achieved      PT LONG TERM GOAL #2   Title Patient will demonstrate walking tolerance increased to 30 minutes  without increase in symptoms to demonstrate improved ability to perform walking program for cardiovascular health.    Baseline 10 minute tolerance (patient report); 12/22 10 minutes (per pt report); 11/09/2019: 7 min; 12/07/2019 unable to assess due to icy streets;  01/29/2020: 10 min; 03/20/2020 10 min; 05/14/2020: 10 min x 3 per day; 06/11/2020: 10min x 3 per day; 07/15/2020: 10min x 3 per day; 09/02/2020: 10min x 3 (just standing rest breaks in between); 11/06/2019: 10 min x 3    Time 8    Period Weeks    Status Partially Met      PT LONG TERM GOAL #3   Title Patient will   report worst pain of 3/10 during therapy to demonstrate reduced disability    Baseline Worst 7/10; 12/22 worst 9/10; 11/09/2019: 9/10; 12/07/2019 7/10; 01/29/2020: 7/10; 05/14/2020: 0/ 10    Time 8    Period Weeks    Status Achieved      PT LONG TERM GOAL #4   Title Patient will demonstrate ability to perform all bed mobility maneuvers without increase in pain for reduced disability with mobility and transfers with ADLs.    Baseline Reports pain with bed mobility supine <> sidelying L/R, sidelying to sitting EOB; 12/22 pt able to perform I without increased pain    Time 8    Period Weeks    Status Achieved      PT LONG TERM GOAL #5   Title Patient will improve 10MW speed to 1.0 m/sec to meet norms for minimal fall risk and full community ambulation.    Baseline 0.88 m/sec; 12/22 0.96 m/sec; 11/09/2019: deferred; 12/07/2019 0.90 m/sec; 01/29/2020: .9  m/sec; 03/20/2020: .9m/s; 05/14/2020: . 9 m/s; 06/11/2020: .9m/s; 07/15/2020: 1 m/s    Time 8    Period Weeks    Status Achieved      Additional Long Term Goals   Additional Long Term Goals Yes      PT LONG TERM GOAL #6   Title Patient will increase 6MWT by 200 ft to demonstrate improvement in functional capacity for transition to cardiac walking program    Baseline 6MWT = 1195; 1/7/20201: deferred; 12/07/2019 1095; 01/29/2020: deferred secondary to fatigue; 03/20/2020: 1070ft; 07/15/2020: 1130ft;  09/03/2020: 800ft; 11/05/2020: 973ft; 01/13/2021: 1000ft    Time 8    Period Weeks    Status On-going      PT LONG TERM GOAL #7   Title Patient will improve her 5xsts to under 15 sec to indicate singificnat improvement in LE functional strength and decrease in fall risk.    Baseline 24.5 sec; 03/20/2020: 19.19 sec; 06/13/2020: 14. 92    Time 6    Period Weeks    Status Achieved      PT LONG TERM GOAL #8   Title Patient will improve FGA to 28/30 to indicate improvement in dynamic balance and improvement in stability.    Baseline 23/30; 06/11/2020: 25/30; 07/15/2020: 26/30; 09/02/2020: 27/30; 11/05/2020: 28/30    Time 6    Period Weeks    Status Achieved      PT LONG TERM GOAL  #9   TITLE Patient will be able to ascend/desend the stairs without use of UEs to increase safety and decrease fall risk with stair use.    Baseline requires use of UEs; 01/13/2021: able to perform with CGA    Time 6    Period Weeks    Status Achieved      PT LONG TERM GOAL  #10   TITLE Patient will improve her TUG scores to under 12 sec for normal tug; 15 sec for cognitive TUG to decrease fall risk overall    Baseline normal: 17.2; 27.3 sec    Time 6    Period Weeks    Status New                 Plan - 01/14/21 1025    Clinical Impression Statement Patient with improvement with performing 6minWT and asedning/desnding the stairs indicating functional carryover between sessions. Although patient is improving, she demosntrates inreased difficulty with performing walking after transferring from sitting to standing. Patient's TUG scores has room for improvement as it took the   patient 17 sec to perform a normal TUG and 27 sec for cognitive functioning. Patient is improving overall but will benefit from further skilled therapy focused on improving these limitations.    Personal Factors and Comorbidities Age;Comorbidity 3+;Time since onset of injury/illness/exacerbation;Comorbidity 2;Other    Comorbidities MCI, HTN,  CA, cataracts    Examination-Activity Limitations Bend;Sit;Squat;Stairs;Stand;Sleep    Examination-Participation Restrictions Cleaning;Community Activity    Stability/Clinical Decision Making Evolving/Moderate complexity    Rehab Potential Fair    Clinical Impairments Affecting Rehab Potential MCI    PT Frequency 2x / week    PT Duration 6 weeks    PT Treatment/Interventions ADLs/Self Care Home Management;Therapeutic exercise;Patient/family education;Neuromuscular re-education;Therapeutic activities;Functional mobility training;Balance training;Manual techniques;Gait training;Stair training;Moist Heat;Electrical Stimulation;Cryotherapy;Taping;Dry needling;Energy conservation;Passive range of motion;Joint Manipulations    PT Next Visit Plan Progress glute therex, pelvic-hip dissociation; check and progress HEP with stretches    PT Home Exercise Plan lateral step ups, step ups, hip kickers ext/abd red theraband    Consulted and Agree with Plan of Care Patient           Patient will benefit from skilled therapeutic intervention in order to improve the following deficits and impairments:  Decreased range of motion,Postural dysfunction,Pain,Abnormal gait,Decreased activity tolerance,Decreased coordination,Decreased endurance,Decreased balance,Decreased mobility,Difficulty walking,Hypomobility,Increased muscle spasms,Impaired perceived functional ability,Decreased strength,Impaired flexibility  Visit Diagnosis: Difficulty in walking, not elsewhere classified  Muscle weakness (generalized)  Pain in left hip     Problem List Patient Active Problem List   Diagnosis Date Noted  . Osteopenia of neck of right femur 09/06/2020  . Class 1 obesity with serious comorbidity and body mass index (BMI) of 32.0 to 32.9 in adult 08/29/2020  . Candidal skin infection 04/29/2020  . Parkinson disease ([place]) 02/22/2020  . 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 Bipolar disorder, in full remission, most recent  episode depressed ([place]) 11/08/2019  . Avitaminosis D 08/24/2019  . Insomnia due to mental condition 05/31/2019  . Bipolar I disorder, most recent episode depressed ([place]) 05/31/2019  . Cognitive disorder 05/31/2019  . Alcohol use disorder, moderate, in sustained remission ([place]) 05/31/2019  . Elevated tumor markers 05/27/2019  . GAD (generalized anxiety disorder) 04/10/2019  . MDD (major depressive disorder), severe ([place]) 01/31/2019  . High serum carbohydrate antigen 19-9 (CA19-9) 12/01/2018  . Adjustment disorder with anxious mood 11/24/2018  . Essential hypertension 11/24/2018  . B12 deficiency 10/17/2018  . Incisional hernia, without obstruction or gangrene 09/12/2018  . Genetic testing 04/07/2018  . Malignant neoplasm of upper-outer quadrant of right breast in female, estrogen receptor positive ([place]) 03/17/2018  . Malignant neoplasm of upper-inner quadrant of right breast in female, estrogen receptor positive ([place]) 03/17/2018  . History of psychosis 02/07/2018  . History of insomnia 02/07/2018    Blythe Stanford, PT DPT 01/14/2021, 1:43 PM  [facility] [address] [place], [address] Phone: [phone]   Fax:  [phone]  Name: Kendra [name] MRN: [id number] Date of Birth: 02-10-1946

## 2021-01-15 ENCOUNTER — Other Ambulatory Visit: Payer: Self-pay

## 2021-01-15 ENCOUNTER — Ambulatory Visit: Payer: Medicare PPO

## 2021-01-15 DIAGNOSIS — M25551 Pain in right hip: Secondary | ICD-10-CM

## 2021-01-15 DIAGNOSIS — M25522 Pain in left elbow: Secondary | ICD-10-CM | POA: Diagnosis not present

## 2021-01-15 DIAGNOSIS — M25552 Pain in left hip: Secondary | ICD-10-CM

## 2021-01-15 DIAGNOSIS — R262 Difficulty in walking, not elsewhere classified: Secondary | ICD-10-CM | POA: Diagnosis not present

## 2021-01-15 DIAGNOSIS — M6281 Muscle weakness (generalized): Secondary | ICD-10-CM | POA: Diagnosis not present

## 2021-01-15 DIAGNOSIS — M25622 Stiffness of left elbow, not elsewhere classified: Secondary | ICD-10-CM | POA: Diagnosis not present

## 2021-01-15 NOTE — Therapy (Signed)
[facility] [address] [place], [address] Phone: [phone]   Fax:  [phone]  Physical Therapy Treatment  Patient Details  Name: Kendra [name] MRN: [id number] Date of Birth: 1946/03/01 No data recorded  Encounter Date: 01/15/2021   PT End of Session - 01/15/21 1317    Visit Number 91    Number of Visits 100    Date for PT Re-Evaluation 04/02/21    Authorization Type Recert at visit 37    PT Start Time 1303    PT Stop Time 1345    PT Time Calculation (min) 42 min    Equipment Utilized During Treatment Gait belt    Activity Tolerance Patient tolerated treatment well;No increased pain    Behavior During Therapy WFL for tasks assessed/performed           Past Medical History:  Diagnosis Date  . Anxiety   . Breast cancer ([place]) 03/2018   right breast cancer at 11:00 and 1:00  . Bursitis    bilateral hips and knees  . Complication of anesthesia   . Depression   . Family history of breast cancer 03/24/2018  . Family history of lung cancer 03/24/2018  . History of alcohol dependence ([place]) 2007   no ETOH; resolved since 2007  . History of hiatal hernia   . History of psychosis 2014   due to sleep disturbance  . Hypertension   . Parkinson disease ([place])   . Personal history of radiation therapy 06/2018-07/2018   right breast ca  . PONV (postoperative nausea and vomiting)     Past Surgical History:  Procedure Laterality Date  . BREAST BIOPSY Right 03/14/2018   11:00 DCIS and invasive ductal carcinoma  . BREAST BIOPSY Right 03/14/2018   1:00 Invasive ductal carcinoma  . BREAST LUMPECTOMY Right 04/27/2018   lumpectomy of 11 and 1:00 cancers, clear margins, negative LN  . BREAST LUMPECTOMY WITH RADIOACTIVE SEED AND SENTINEL LYMPH NODE BIOPSY Right 04/27/2018   Procedure: RIGHT BREAST LUMPECTOMY WITH RADIOACTIVE SEED X 2 AND RIGHT SENTINEL LYMPH NODE BIOPSY;  Surgeon: [name], MD;   Location: [facility];  Service: General;  Laterality: Right;  . CATARACT EXTRACTION W/PHACO Left 08/30/2019   Procedure: CATARACT EXTRACTION PHACO AND INTRAOCULAR LENS PLACEMENT ([place]) LEFT panoptix toric  01:20.5  12.7%  10.26;  Surgeon: Leandrew Koyanagi, MD;  Location: [facility];  Service: Ophthalmology;  Laterality: Left;  . CATARACT EXTRACTION W/PHACO Right 09/20/2019   Procedure: CATARACT EXTRACTION PHACO AND INTRAOCULAR LENS PLACEMENT ([facility]) RIGHT 5.71  00:36.4  15.7%;  Surgeon: Leandrew Koyanagi, MD;  Location: [facility];  Service: Ophthalmology;  Laterality: Right;  . LAPAROTOMY N/A 02/10/2018   Procedure: EXPLORATORY LAPAROTOMY;  Surgeon: Isabel Caprice, MD;  Location: [facility];  Service: Gynecology;  Laterality: N/A;  . MASS EXCISION  01/2018   abdominal  . OVARIAN CYST REMOVAL    . SALPINGOOPHORECTOMY Bilateral 02/10/2018   Procedure: BILATERAL SALPINGO OOPHORECTOMY; PERITONEAL WASHINGS;  Surgeon: Isabel Caprice, MD;  Location: [facility];  Service: Gynecology;  Laterality: Bilateral;  . TONSILLECTOMY    . TONSILLECTOMY AND ADENOIDECTOMY  1953    There were no vitals filed for this visit.   Subjective Assessment - 01/15/21 1308    Subjective Patient states she saw her physician aobut her difficulties with sleeping, states she is not chnaging her medication.    Pertinent History Patient report unclear regarding onset. Visited MD on Sept 16  and received injection with relief for approximately 1 month. Reports she cannot receive an additional injection pending eye surgery. Received dx from MD of hip bursitis in B hips. Reports pain with walking, stanindg, sitting 5/10 current 7/10 worst 3/10 best, with temporary relief when she is sitting "just right" and with naproxen. Agg: activity. Cormorbidities: hx of breast cancer, HTN, MCI, insomnia, cataracts    Limitations Sitting;Standing;Walking;House hold activities    How long can you sit comfortably?  Pain constant, reduced with neutral pelvic tilt    How long can you stand comfortably? Pain constant, increases with time    How long can you walk comfortably? Pain constant, increases with time    Patient Stated Goals Be able to move without hurting, walk 1 hour    Currently in Pain? No/denies              TREATMENT Neuromuscular rehab Seated reach out, up back and down - x 20 Backwards walking with ball toss 4 x 22f Soccer kicks - x 15 B without UE support Walking in circles with head turns L/R - x 2531f [place] pick ups off of the floor - x 10  Wall rolls in standing - x 10   Performing exercises to address pain and spasms         PT Education - 01/15/21 1311    Education provided Yes    Education Details form/technique with exercise    Person(s) Educated Patient    Methods Explanation;Demonstration    Comprehension Verbalized understanding;Returned demonstration            PT Short Term Goals - 01/14/21 1029      PT SHORT TERM GOAL #1   Title Patient will demonstrate complaince with HEP to speed recovery and reduce total number of visits.    Baseline HEP given; 01/14/2021: Independent with HEP    Time 2    Period Weeks    Status Achieved    Target Date 09/27/19             PT Long Term Goals - 01/13/21 1328      PT LONG TERM GOAL #1   Title Patient will improve hip strength to 4/5 grossly to facilitate improved mobility with ADLs.    Baseline 3, 4-; 12/22 deferred to NV; 11/09/2019: 4-/5; 12/07/2019 L hip flexor 4, abd and ext 3+; 01/29/2020: 4/5 for most hip motions, 3+/5 for abd, ext; 03/20/2020: 4/5 for hip movements    Time 8    Period Weeks    Status Achieved      PT LONG TERM GOAL #2   Title Patient will demonstrate walking tolerance increased to 30 minutes without increase in symptoms to demonstrate improved ability to perform walking program for cardiovascular health.    Baseline 10 minute tolerance (patient report); 12/22 10 minutes (per pt  report); 11/09/2019: 7 min; 12/07/2019 unable to assess due to icy streets;  01/29/2020: 10 min; 03/20/2020 10 min; 05/14/2020: 10 min x 3 per day; 06/11/2020: 1065mx 3 per day; 07/15/2020: 41m15m 3 per day; 09/02/2020: 41mi31m3 (just standing rest breaks in between); 11/06/2019: 10 min x 3    Time 8    Period Weeks    Status Partially Met      PT LONG TERM GOAL #3   Title Patient will report worst pain of 3/10 during therapy to demonstrate reduced disability    Baseline Worst 7/10; 12/22 worst 9/10; 11/09/2019: 9/10; 12/07/2019 7/10; 01/29/2020: 7/10;  05/14/2020: 0/ 10    Time 8    Period Weeks    Status Achieved      PT LONG TERM GOAL #4   Title Patient will demonstrate ability to perform all bed mobility maneuvers without increase in pain for reduced disability with mobility and transfers with ADLs.    Baseline Reports pain with bed mobility supine <> sidelying L/R, sidelying to sitting EOB; 12/22 pt able to perform I without increased pain    Time 8    Period Weeks    Status Achieved      PT LONG TERM GOAL #5   Title Patient will improve 10MW speed to 1.0 m/sec to meet norms for minimal fall risk and full community ambulation.    Baseline 0.88 m/sec; 12/22 0.96 m/sec; 11/09/2019: deferred; 12/07/2019 0.90 m/sec; 01/29/2020: .9  m/sec; 03/20/2020: .46ms; 05/14/2020: . 9 m/s; 06/11/2020: .938m; 07/15/2020: 1 m/s    Time 8    Period Weeks    Status Achieved      Additional Long Term Goals   Additional Long Term Goals Yes      PT LONG TERM GOAL #6   Title Patient will increase 6MWT by 200 ft to demonstrate improvement in functional capacity for transition to cardiac walking program    Baseline 6MWT = 1195; 1/7/20201: deferred; 12/07/2019 1095; 01/29/2020: deferred secondary to fatigue; 03/20/2020: 107013f9/13/2021: 1130f71f1/12/2019: 800ft33f4/2022: 973ft;16f4/2022: 1000ft  45fme 8    Period Weeks    Status On-going      PT LONG TERM GOAL #7   Title Patient will improve her 5xsts to under 15 sec to  indicate singificnat improvement in LE functional strength and decrease in fall risk.    Baseline 24.5 sec; 03/20/2020: 19.19 sec; 06/13/2020: 14. 92    Time 6    Period Weeks    Status Achieved      PT LONG TERM GOAL #8   Title Patient will improve FGA to 28/30 to indicate improvement in dynamic balance and improvement in stability.    Baseline 23/30; 06/11/2020: 25/30; 07/15/2020: 26/30; 09/02/2020: 27/30; 11/05/2020: 28/30    Time 6    Period Weeks    Status Achieved      PT LONG TERM GOAL  #9   TITLE Patient will be able to ascend/desend the stairs without use of UEs to increase safety and decrease fall risk with stair use.    Baseline requires use of UEs; 01/13/2021: able to perform with CGA    Time 6    Period Weeks    Status Achieved      PT LONG TERM GOAL  #10   TITLE Patient will improve her TUG scores to under 12 sec for normal tug; 15 sec for cognitive TUG to decrease fall risk overall    Baseline normal: 17.2; 27.3 sec    Time 6    Period Weeks    Status New                 Plan - 01/15/21 1319    Clinical Impression Statement Patient demonstrates improvement in walking backwards as she did not lose her balance with turning directions. Patient also with greater amount of balance with performing wall circles indicating further improvement. Although patient is improving, she conitnues to require frequent sitting rest breaks as well as increased difficulty with narrow base of support. Patient will benefit from further skilled therapy to improve functional capacity.    Personal Factors and  Comorbidities Age;Comorbidity 3+;Time since onset of injury/illness/exacerbation;Comorbidity 2;Other    Comorbidities MCI, HTN, CA, cataracts    Examination-Activity Limitations Bend;Sit;Squat;Stairs;Stand;Sleep    Examination-Participation Restrictions Cleaning;Community Activity    Stability/Clinical Decision Making Evolving/Moderate complexity    Rehab Potential Fair    Clinical  Impairments Affecting Rehab Potential MCI    PT Frequency 2x / week    PT Duration 6 weeks    PT Treatment/Interventions ADLs/Self Care Home Management;Therapeutic exercise;Patient/family education;Neuromuscular re-education;Therapeutic activities;Functional mobility training;Balance training;Manual techniques;Gait training;Stair training;Moist Heat;Electrical Stimulation;Cryotherapy;Taping;Dry needling;Energy conservation;Passive range of motion;Joint Manipulations    PT Next Visit Plan Progress glute therex, pelvic-hip dissociation; check and progress HEP with stretches    PT Home Exercise Plan lateral step ups, step ups, hip kickers ext/abd red theraband    Consulted and Agree with Plan of Care Patient           Patient will benefit from skilled therapeutic intervention in order to improve the following deficits and impairments:  Decreased range of motion,Postural dysfunction,Pain,Abnormal gait,Decreased activity tolerance,Decreased coordination,Decreased endurance,Decreased balance,Decreased mobility,Difficulty walking,Hypomobility,Increased muscle spasms,Impaired perceived functional ability,Decreased strength,Impaired flexibility  Visit Diagnosis: Difficulty in walking, not elsewhere classified  Muscle weakness (generalized)  Pain in left hip  Pain in right hip     Problem List Patient Active Problem List   Diagnosis Date Noted  . Osteopenia of neck of right femur 09/06/2020  . Class 1 obesity with serious comorbidity and body mass index (BMI) of 32.0 to 32.9 in adult 08/29/2020  . Candidal skin infection 04/29/2020  . Parkinson disease ([place]) 02/22/2020  . 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 Bipolar disorder, in full remission, most recent episode depressed ([place]) 11/08/2019  . Avitaminosis D 08/24/2019  . Insomnia due to mental condition 05/31/2019  . Bipolar I disorder, most recent episode depressed ([place]) 05/31/2019  . Cognitive disorder 05/31/2019  . Alcohol use  disorder, moderate, in sustained remission ([place]) 05/31/2019  . Elevated tumor markers 05/27/2019  . GAD (generalized anxiety disorder) 04/10/2019  . MDD (major depressive disorder), severe ([place]) 01/31/2019  . High serum carbohydrate antigen 19-9 (CA19-9) 12/01/2018  . Adjustment disorder with anxious mood 11/24/2018  . Essential hypertension 11/24/2018  . B12 deficiency 10/17/2018  . Incisional hernia, without obstruction or gangrene 09/12/2018  . Genetic testing 04/07/2018  . Malignant neoplasm of upper-outer quadrant of right breast in female, estrogen receptor positive ([place]) 03/17/2018  . Malignant neoplasm of upper-inner quadrant of right breast in female, estrogen receptor positive ([place]) 03/17/2018  . History of psychosis 02/07/2018  . History of insomnia 02/07/2018    Blythe Stanford, PT DPT 01/15/2021, 1:47 PM  [facility] [address], [place], [address] Phone: [phone]   Fax:  [phone]  Name: Priscila Bean MRN: [id number] Date of Birth: [birth year]

## 2021-01-20 ENCOUNTER — Ambulatory Visit: Payer: Medicare PPO

## 2021-01-20 ENCOUNTER — Other Ambulatory Visit: Payer: Self-pay

## 2021-01-20 DIAGNOSIS — R262 Difficulty in walking, not elsewhere classified: Secondary | ICD-10-CM

## 2021-01-20 DIAGNOSIS — M25622 Stiffness of left elbow, not elsewhere classified: Secondary | ICD-10-CM | POA: Diagnosis not present

## 2021-01-20 DIAGNOSIS — M25552 Pain in left hip: Secondary | ICD-10-CM | POA: Diagnosis not present

## 2021-01-20 DIAGNOSIS — M25522 Pain in left elbow: Secondary | ICD-10-CM | POA: Diagnosis not present

## 2021-01-20 DIAGNOSIS — M6281 Muscle weakness (generalized): Secondary | ICD-10-CM | POA: Diagnosis not present

## 2021-01-20 DIAGNOSIS — M25551 Pain in right hip: Secondary | ICD-10-CM | POA: Diagnosis not present

## 2021-01-20 NOTE — Therapy (Signed)
[facility] [address], [place], [address] Phone: [phone]   Fax:  [phone]  Physical Therapy Treatment  Patient Details  Name: Kendra [name] MRN: [id number] Date of Birth: 1946/05/23 No data recorded  Encounter Date: 01/20/2021   PT End of Session - 01/20/21 1309    Visit Number 92    Number of Visits 100    Date for PT Re-Evaluation 04/02/21    Authorization Type Recert at visit 50    PT Start Time 1304    PT Stop Time 1345    PT Time Calculation (min) 41 min    Equipment Utilized During Treatment Gait belt    Activity Tolerance Patient tolerated treatment well;No increased pain    Behavior During Therapy WFL for tasks assessed/performed           Past Medical History:  Diagnosis Date  . Anxiety   . Breast cancer ([place]) 03/2018   right breast cancer at 11:00 and 1:00  . Bursitis    bilateral hips and knees  . Complication of anesthesia   . Depression   . Family history of breast cancer 03/24/2018  . Family history of lung cancer 03/24/2018  . History of alcohol dependence ([place]) 2007   no ETOH; resolved since 2007  . History of hiatal hernia   . History of psychosis 2014   due to sleep disturbance  . Hypertension   . Parkinson disease ([place])   . Personal history of radiation therapy 06/2018-07/2018   right breast ca  . PONV (postoperative nausea and vomiting)     Past Surgical History:  Procedure Laterality Date  . BREAST BIOPSY Right 03/14/2018   11:00 DCIS and invasive ductal carcinoma  . BREAST BIOPSY Right 03/14/2018   1:00 Invasive ductal carcinoma  . BREAST LUMPECTOMY Right 04/27/2018   lumpectomy of 11 and 1:00 cancers, clear margins, negative LN  . BREAST LUMPECTOMY WITH RADIOACTIVE SEED AND SENTINEL LYMPH NODE BIOPSY Right 04/27/2018   Procedure: RIGHT BREAST LUMPECTOMY WITH RADIOACTIVE SEED X 2 AND RIGHT SENTINEL LYMPH NODE BIOPSY;  Surgeon: [name], MD;   Location: [facility];  Service: General;  Laterality: Right;  . CATARACT EXTRACTION W/PHACO Left 08/30/2019   Procedure: CATARACT EXTRACTION PHACO AND INTRAOCULAR LENS PLACEMENT ([place]) LEFT panoptix toric  01:20.5  12.7%  10.26;  Surgeon: Leandrew Koyanagi, MD;  Location: [facility];  Service: Ophthalmology;  Laterality: Left;  . CATARACT EXTRACTION W/PHACO Right 09/20/2019   Procedure: CATARACT EXTRACTION PHACO AND INTRAOCULAR LENS PLACEMENT ([facility]) RIGHT 5.71  00:36.4  15.7%;  Surgeon: Leandrew Koyanagi, MD;  Location: [facility];  Service: Ophthalmology;  Laterality: Right;  . LAPAROTOMY N/A 02/10/2018   Procedure: EXPLORATORY LAPAROTOMY;  Surgeon: Isabel Caprice, MD;  Location: [facility];  Service: Gynecology;  Laterality: N/A;  . MASS EXCISION  01/2018   abdominal  . OVARIAN CYST REMOVAL    . SALPINGOOPHORECTOMY Bilateral 02/10/2018   Procedure: BILATERAL SALPINGO OOPHORECTOMY; PERITONEAL WASHINGS;  Surgeon: Isabel Caprice, MD;  Location: [facility];  Service: Gynecology;  Laterality: Bilateral;  . TONSILLECTOMY    . TONSILLECTOMY AND ADENOIDECTOMY  1952    There were no vitals filed for this visit.   Subjective Assessment - 01/20/21 1307    Subjective Patient states she has had minimal pain and dizziness in the morning which is improved.    Pertinent History Patient report unclear regarding onset. Visited MD on Sept 16 and received injection  with relief for approximately 1 month. Reports she cannot receive an additional injection pending eye surgery. Received dx from MD of hip bursitis in B hips. Reports pain with walking, stanindg, sitting 5/10 current 7/10 worst 3/10 best, with temporary relief when she is sitting "just right" and with naproxen. Agg: activity. Cormorbidities: hx of breast cancer, HTN, MCI, insomnia, cataracts    Limitations Sitting;Standing;Walking;House hold activities    How long can you sit comfortably? Pain constant, reduced with  neutral pelvic tilt    How long can you stand comfortably? Pain constant, increases with time    How long can you walk comfortably? Pain constant, increases with time    Patient Stated Goals Be able to move without hurting, walk 1 hour    Currently in Pain? No/denies           TREATMENT Neuromuscular rehab Seated reach out, up back and down - x 20 Soccer kicks - x 20 B without UE support Backwards walking with ball toss 4 x 44f Backwards walking with busy backgrounds - [id number] a circle Step ups onto 6" step - x 10 without UE support Wall rolls in standing - x 10   Performing exercises to address pain and spasms      PT Education - 01/20/21 1308    Education provided Yes    Education Details form/technique with exercise    Person(s) Educated Patient    Methods Explanation;Demonstration    Comprehension Returned demonstration;Verbalized understanding            PT Short Term Goals - 01/14/21 1029      PT SHORT TERM GOAL #1   Title Patient will demonstrate complaince with HEP to speed recovery and reduce total number of visits.    Baseline HEP given; 01/14/2021: Independent with HEP    Time 2    Period Weeks    Status Achieved    Target Date 09/27/19             PT Long Term Goals - 01/13/21 1328      PT LONG TERM GOAL #1   Title Patient will improve hip strength to 4/5 grossly to facilitate improved mobility with ADLs.    Baseline 3, 4-; 12/22 deferred to NV; 11/09/2019: 4-/5; 12/07/2019 L hip flexor 4, abd and ext 3+; 01/29/2020: 4/5 for most hip motions, 3+/5 for abd, ext; 03/20/2020: 4/5 for hip movements    Time 8    Period Weeks    Status Achieved      PT LONG TERM GOAL #2   Title Patient will demonstrate walking tolerance increased to 30 minutes without increase in symptoms to demonstrate improved ability to perform walking program for cardiovascular health.    Baseline 10 minute tolerance (patient report); 12/22 10 minutes (per pt report); 11/09/2019: 7  min; 12/07/2019 unable to assess due to icy streets;  01/29/2020: 10 min; 03/20/2020 10 min; 05/14/2020: 10 min x 3 per day; 06/11/2020: 1024mx 3 per day; 07/15/2020: 29m48m 3 per day; 09/02/2020: 29mi86m3 (just standing rest breaks in between); 11/06/2019: 10 min x 3    Time 8    Period Weeks    Status Partially Met      PT LONG TERM GOAL #3   Title Patient will report worst pain of 3/10 during therapy to demonstrate reduced disability    Baseline Worst 7/10; 12/22 worst 9/10; 11/09/2019: 9/10; 12/07/2019 7/10; 01/29/2020: 7/10; 05/14/2020: 0/ 10    Time 8  Period Weeks    Status Achieved      PT LONG TERM GOAL #4   Title Patient will demonstrate ability to perform all bed mobility maneuvers without increase in pain for reduced disability with mobility and transfers with ADLs.    Baseline Reports pain with bed mobility supine <> sidelying L/R, sidelying to sitting EOB; 12/22 pt able to perform I without increased pain    Time 8    Period Weeks    Status Achieved      PT LONG TERM GOAL #5   Title Patient will improve 10MW speed to 1.0 m/sec to meet norms for minimal fall risk and full community ambulation.    Baseline 0.88 m/sec; 12/22 0.96 m/sec; 11/09/2019: deferred; 12/07/2019 0.90 m/sec; 01/29/2020: .9  m/sec; 03/20/2020: .35ms; 05/14/2020: . 9 m/s; 06/11/2020: .980m; 07/15/2020: 1 m/s    Time 8    Period Weeks    Status Achieved      Additional Long Term Goals   Additional Long Term Goals Yes      PT LONG TERM GOAL #6   Title Patient will increase 6MWT by 200 ft to demonstrate improvement in functional capacity for transition to cardiac walking program    Baseline 6MWT = 1195; 1/7/20201: deferred; 12/07/2019 1095; 01/29/2020: deferred secondary to fatigue; 03/20/2020: 107054f9/13/2021: 1130f41f1/12/2019: 800ft82f4/2022: 973ft;52f4/2022: 1000ft  82fme 8    Period Weeks    Status On-going      PT LONG TERM GOAL #7   Title Patient will improve her 5xsts to under 15 sec to indicate singificnat  improvement in LE functional strength and decrease in fall risk.    Baseline 24.5 sec; 03/20/2020: 19.19 sec; 06/13/2020: 14. 92    Time 6    Period Weeks    Status Achieved      PT LONG TERM GOAL #8   Title Patient will improve FGA to 28/30 to indicate improvement in dynamic balance and improvement in stability.    Baseline 23/30; 06/11/2020: 25/30; 07/15/2020: 26/30; 09/02/2020: 27/30; 11/05/2020: 28/30    Time 6    Period Weeks    Status Achieved      PT LONG TERM GOAL  #9   TITLE Patient will be able to ascend/desend the stairs without use of UEs to increase safety and decrease fall risk with stair use.    Baseline requires use of UEs; 01/13/2021: able to perform with CGA    Time 6    Period Weeks    Status Achieved      PT LONG TERM GOAL  #10   TITLE Patient will improve her TUG scores to under 12 sec for normal tug; 15 sec for cognitive TUG to decrease fall risk overall    Baseline normal: 17.2; 27.3 sec    Time 6    Period Weeks    Status New                 Plan - 01/20/21 1325    Clinical Impression Statement Focused more on glaze deviation with activities performed today (threw ball heigher into air) to address vestibular system limitations. Patient able to tolerate without LOB indicating functional carryover between sessions. This is an iprovement compared to previous sessions. Although patient is improving, she continues to require frequent sitting rest breaks throughout the session.    Personal Factors and Comorbidities Age;Comorbidity 3+;Time since onset of injury/illness/exacerbation;Comorbidity 2;Other    Comorbidities MCI, HTN, CA, cataracts    Examination-Activity Limitations  Bend;Sit;Squat;Stairs;Stand;Sleep    Examination-Participation Restrictions Cleaning;Community Activity    Stability/Clinical Decision Making Evolving/Moderate complexity    Rehab Potential Fair    Clinical Impairments Affecting Rehab Potential MCI    PT Frequency 2x / week    PT Duration  6 weeks    PT Treatment/Interventions ADLs/Self Care Home Management;Therapeutic exercise;Patient/family education;Neuromuscular re-education;Therapeutic activities;Functional mobility training;Balance training;Manual techniques;Gait training;Stair training;Moist Heat;Electrical Stimulation;Cryotherapy;Taping;Dry needling;Energy conservation;Passive range of motion;Joint Manipulations    PT Next Visit Plan Progress glute therex, pelvic-hip dissociation; check and progress HEP with stretches    PT Home Exercise Plan lateral step ups, step ups, hip kickers ext/abd red theraband    Consulted and Agree with Plan of Care Patient           Patient will benefit from skilled therapeutic intervention in order to improve the following deficits and impairments:  Decreased range of motion,Postural dysfunction,Pain,Abnormal gait,Decreased activity tolerance,Decreased coordination,Decreased endurance,Decreased balance,Decreased mobility,Difficulty walking,Hypomobility,Increased muscle spasms,Impaired perceived functional ability,Decreased strength,Impaired flexibility  Visit Diagnosis: Difficulty in walking, not elsewhere classified  Muscle weakness (generalized)     Problem List Patient Active Problem List   Diagnosis Date Noted  . Osteopenia of neck of right femur 09/06/2020  . Class 1 obesity with serious comorbidity and body mass index (BMI) of 32.0 to 32.9 in adult 08/29/2020  . Candidal skin infection 04/29/2020  . Parkinson disease ([place]) 02/22/2020  . 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 Bipolar disorder, in full remission, most recent episode depressed ([place]) 11/08/2019  . Avitaminosis D 08/24/2019  . Insomnia due to mental condition 05/31/2019  . Bipolar I disorder, most recent episode depressed ([place]) 05/31/2019  . Cognitive disorder 05/31/2019  . Alcohol use disorder, moderate, in sustained remission ([place]) 05/31/2019  . Elevated tumor markers 05/27/2019  . GAD (generalized anxiety  disorder) 04/10/2019  . MDD (major depressive disorder), severe ([place]) 01/31/2019  . High serum carbohydrate antigen 19-9 (CA19-9) 12/01/2018  . Adjustment disorder with anxious mood 11/24/2018  . Essential hypertension 11/24/2018  . B12 deficiency 10/17/2018  . Incisional hernia, without obstruction or gangrene 09/12/2018  . Genetic testing 04/07/2018  . Malignant neoplasm of upper-outer quadrant of right breast in female, estrogen receptor positive ([place]) 03/17/2018  . Malignant neoplasm of upper-inner quadrant of right breast in female, estrogen receptor positive ([place]) 03/17/2018  . History of psychosis 02/07/2018  . History of insomnia 02/07/2018    Blythe Stanford, PT DPT 01/20/2021, 1:40 PM  [facility] [address], [place], [address] Phone: [phone]   Fax:  [phone]  Name: Jamielyn Petrucci MRN: [id number] Date of Birth: [birth year]

## 2021-01-21 ENCOUNTER — Encounter: Payer: Self-pay | Admitting: Psychiatry

## 2021-01-21 ENCOUNTER — Ambulatory Visit (INDEPENDENT_AMBULATORY_CARE_PROVIDER_SITE_OTHER): Payer: Medicare PPO | Admitting: Psychiatry

## 2021-01-21 VITALS — BP 131/68 | HR 108 | Temp 97.1°F | Wt 205.2 lb

## 2021-01-21 DIAGNOSIS — F5105 Insomnia due to other mental disorder: Secondary | ICD-10-CM

## 2021-01-21 DIAGNOSIS — F3176 Bipolar disorder, in full remission, most recent episode depressed: Secondary | ICD-10-CM

## 2021-01-21 DIAGNOSIS — F411 Generalized anxiety disorder: Secondary | ICD-10-CM

## 2021-01-21 DIAGNOSIS — F09 Unspecified mental disorder due to known physiological condition: Secondary | ICD-10-CM | POA: Diagnosis not present

## 2021-01-21 MED ORDER — TRAZODONE HCL 100 MG PO TABS: 100.0000 mg | ORAL_TABLET | Freq: Every day | ORAL | 0 refills | Status: DC

## 2021-01-21 NOTE — Progress Notes (Signed)
[name] Progress Note  01/21/2021 6:11 PM Kendra [name]  MRN:  [id number]  Chief Complaint:  Chief Complaint    Follow-up; Depression     HPI: Kendra [name] is a 75 year old Caucasian female, married, lives in [place], has a history of bipolar disorder, GAD, insomnia, cognitive disorder, multifocal stage I right breast cancer status post treatment-lumpectomy, ovarian cyst removal, Parkinson's disease, bilateral hearing loss was evaluated in office today.  Patient being a limited historian collateral information was obtained from spouse who was present in session.  According to spouse he has noticed improvement in patient.  Her energy level kind of fluctuates.  She tends to get tired easily likely due to her Parkinson's disease.  However her mood is overall okay.  Her tremors can get worse on and off especially when she is anxious.  She does have balance problems and is currently in physical therapy.  It is getting better.  Sleep overall is okay.  Patient reports she has been sleeping 6 to 8 hours at night.  She does worry about being lightheaded on and off.  She however continues to be motivated to work with her physical therapist.  She is going to have a new therapist next week.  Patient denies any other side effects to any of her medications.  She is compliant on her medications as prescribed.  She denies any suicidality, homicidality or perceptual disturbances.  She appeared to be alert, oriented to person place and situation.  Patient denies any other concerns today.  Visit Diagnosis:    ICD-10-CM   1. Bipolar disorder, in full remission, most recent episode depressed ([place])  F31.76   2. GAD (generalized anxiety disorder)  F41.1   3. Insomnia due to mental condition  F51.05 traZODone (DESYREL) 100 MG tablet  4. Cognitive disorder  F09    likely mild    Past Psychiatric History: I have reviewed past psychiatric history from my progress note on 03/01/2019.  Past trials of  risperidone, Prozac, Seroquel, Ativan  Past Medical History:  Past Medical History:  Diagnosis Date  . Anxiety   . Breast cancer ([place]) 03/2018   right breast cancer at 11:00 and 1:00  . Bursitis    bilateral hips and knees  . Complication of anesthesia   . Depression   . Family history of breast cancer 03/24/2018  . Family history of lung cancer 03/24/2018  . History of alcohol dependence ([place]) 2007   no ETOH; resolved since 2007  . History of hiatal hernia   . History of psychosis 2014   due to sleep disturbance  . Hypertension   . Parkinson disease ([place])   . Personal history of radiation therapy 06/2018-07/2018   right breast ca  . PONV (postoperative nausea and vomiting)     Past Surgical History:  Procedure Laterality Date  . BREAST BIOPSY Right 03/14/2018   11:00 DCIS and invasive ductal carcinoma  . BREAST BIOPSY Right 03/14/2018   1:00 Invasive ductal carcinoma  . BREAST LUMPECTOMY Right 04/27/2018   lumpectomy of 11 and 1:00 cancers, clear margins, negative LN  . BREAST LUMPECTOMY WITH RADIOACTIVE SEED AND SENTINEL LYMPH NODE BIOPSY Right 04/27/2018   Procedure: RIGHT BREAST LUMPECTOMY WITH RADIOACTIVE SEED X 2 AND RIGHT SENTINEL LYMPH NODE BIOPSY;  Surgeon: [name], MD;  Location: [facility];  Service: General;  Laterality: Right;  . CATARACT EXTRACTION W/PHACO Left 08/30/2019   Procedure: CATARACT EXTRACTION PHACO AND INTRAOCULAR LENS PLACEMENT (IOC) LEFT panoptix toric  01:20.5  12.7%  10.26;  Surgeon: Leandrew Koyanagi, MD;  Location: [facility];  Service: Ophthalmology;  Laterality: Left;  . CATARACT EXTRACTION W/PHACO Right 09/20/2019   Procedure: CATARACT EXTRACTION PHACO AND INTRAOCULAR LENS PLACEMENT (IOC) RIGHT 5.71  00:36.4  15.7%;  Surgeon: Leandrew Koyanagi, MD;  Location: [facility];  Service: Ophthalmology;  Laterality: Right;  . LAPAROTOMY N/A 02/10/2018   Procedure: EXPLORATORY LAPAROTOMY;  Surgeon: Isabel Caprice, MD;  Location: [facility];  Service: Gynecology;  Laterality: N/A;  . MASS EXCISION  01/2018   abdominal  . OVARIAN CYST REMOVAL    . SALPINGOOPHORECTOMY Bilateral 02/10/2018   Procedure: BILATERAL SALPINGO OOPHORECTOMY; PERITONEAL WASHINGS;  Surgeon: Isabel Caprice, MD;  Location: [facility];  Service: Gynecology;  Laterality: Bilateral;  . TONSILLECTOMY    . TONSILLECTOMY AND ADENOIDECTOMY  1953    Family Psychiatric History: I have reviewed family psychiatric history from my progress note on 03/01/2019  Family History:  Family History  Problem Relation Age of Onset  . Diabetes Mother   . Breast cancer Mother 35  . Hypertension Mother   . Lung cancer Father        asbestos exposure  . Heart disease Brother 86  . Breast cancer Cousin 80       paternal cousin  . Heart disease Paternal Grandmother 32  . Heart disease Paternal Grandfather     Social History: I have reviewed social history from my progress note on 03/01/2019 Social History   Socioeconomic History  . Marital status: Married    Spouse name: robert  . Number of children: 0  . Years of education: Not on file  . Highest education level: Some college, no degree  Occupational History  . Occupation: retired Furniture conservator/restorer of education  Tobacco Use  . Smoking status: Never Smoker  . Smokeless tobacco: Never Used  Vaping Use  . Vaping Use: Never used  Substance and Sexual Activity  . Alcohol use: Not Currently    Comment: alcohol dependence prior to 2007  . Drug use: Never  . Sexual activity: Yes    Partners: Male    Birth control/protection: Surgical  Other Topics Concern  . Not on file  Social History Narrative  . Not on file   Social Determinants of Health   Financial Resource Strain: Low Risk   . Difficulty of Paying Living Expenses: Not hard at all  Food Insecurity: No Food Insecurity  . Worried About Charity fundraiser in the Last Year: Never true  . Ran Out of Food in the Last  Year: Never true  Transportation Needs: No Transportation Needs  . Lack of Transportation (Medical): No  . Lack of Transportation (Non-Medical): No  Physical Activity: Inactive  . Days of Exercise per Week: 0 days  . Minutes of Exercise per Session: 0 min  Stress: Stress Concern Present  . Feeling of Stress : To some extent  Social Connections: Moderately Integrated  . Frequency of Communication with Friends and Family: More than three times a week  . Frequency of Social Gatherings with Friends and Family: More than three times a week  . Attends Religious Services: Never  . Active Member of Clubs or Organizations: Yes  . Attends Archivist Meetings: 1 to 4 times per year  . Marital Status: Married    Allergies:  Allergies  Allergen Reactions  . Hydroxyzine Anaphylaxis    Tongue swollen  . Other Other (See Comments)    Food  poisoning   . Shrimp [Shellfish Allergy] Other (See Comments)    Food poisoning     Metabolic Disorder Labs: Lab Results  Component Value Date   HGBA1C 5.4 04/13/2019   Lab Results  Component Value Date   PROLACTIN 101.9 06/20/2015   Lab Results  Component Value Date   CHOL 173 04/13/2019   TRIG 78 04/13/2019   HDL 62 04/13/2019   LDLCALC 95 04/13/2019   LDLCALC 103 06/02/2017   Lab Results  Component Value Date   TSH 2.31 04/13/2019   TSH 2.543 07/25/2018    Therapeutic Level Labs: No results found for: LITHIUM No results found for: VALPROATE No components found for:  CBMZ  Current Medications: Current Outpatient Medications  Medication Sig Dispense Refill  . Acetaminophen 500 MG capsule Take 1,000 mg by mouth every 6 (six) hours as needed.     . carbidopa-levodopa (SINEMET IR) 25-100 MG tablet Take 1 tablet by mouth at bedtime.     . cyanocobalamin (,VITAMIN B-12,) 1000 MCG/ML injection Inject 1 mL (1,000 mcg total) into the muscle every 30 (thirty) days. 3 mL 3  . FLUoxetine HCl 60 MG TABS Take 60 mg by mouth daily. 90  tablet 1  . letrozole (FEMARA) 2.5 MG tablet Take 1 tablet (2.5 mg total) by mouth daily. 90 tablet 3  . lisinopril-hydrochlorothiazide (ZESTORETIC) 20-12.5 MG tablet Take 1 tablet by mouth daily. 90 tablet 3  . naproxen sodium (ALEVE) 220 MG tablet Take 220 mg by mouth 2 (two) times daily as needed.     Marland Kitchen NYAMYC powder APPLY TOPICALLY THREE TIMES A DAY 15 g 1  . polyethylene glycol (MIRALAX / GLYCOLAX) 17 g packet Take 17 g by mouth daily.    . risperiDONE (RISPERDAL) 2 MG tablet Take 0.5 tablets (1 mg total) by mouth 2 (two) times daily. 90 tablet 3  . traZODone (DESYREL) 100 MG tablet Take 1 tablet (100 mg total) by mouth at bedtime. 90 tablet 0   Current Facility-Administered Medications  Medication Dose Route Frequency Provider Last Rate Last Admin  . lidocaine (PF) (XYLOCAINE) 1 % injection 4 mL  4 mL Intradermal Once Virginia Crews, MD      . lidocaine (PF) (XYLOCAINE) 1 % injection 4 mL  4 mL Intradermal Once Bacigalupo, Dionne Bucy, MD         Musculoskeletal: Strength & Muscle Tone: [place]: normal Patient leans: N/A  Psychiatric Specialty Exam: Review of Systems  Neurological: Positive for tremors and light-headedness.  Psychiatric/Behavioral: The patient is nervous/anxious.   All other systems reviewed and are negative.   Blood pressure 131/68, pulse (!) 108, temperature (!) 97.1 F (36.2 C), temperature source Temporal, weight 205 lb 3.2 oz (93.1 kg).Body mass index is 33.12 kg/m.  General Appearance: Casual  Eye Contact:  Fair  Speech:  Clear and Coherent  Volume:  Normal  Mood:  Anxious improving  Affect:  Congruent  Thought Process:  Goal Directed and Descriptions of Associations: Intact  Orientation:  Other:  self, situation, place  Thought Content: Logical   Suicidal Thoughts:  No  Homicidal Thoughts:  No  Memory:  Immediate;   Fair Recent;   Fair Remote;   Limited  Judgement:  Fair  Insight:  Fair  Psychomotor Activity:  Tremor   Concentration:  Concentration: Fair and Attention Span: Fair  Recall:  [employer] of Knowledge: Fair  Language: Fair  Akathisia:  No  Handed:  Right  AIMS (if indicated): done  Assets:  Chief Executive Officer Intimacy Social Support  ADL's:  Intact  Cognition: Impaired,  Mild  Sleep:  Fair   Screenings: [place] Office Visit from 01/21/2021 in [facility] Total Score 0    GAD-7   Flowsheet Row Counselor 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 [facility]  Total GAD-7 Score 7    PHQ2-9   [facility] Visit from 01/21/2021 in [facility] from 08/29/2020 in [facility] 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 [facility] Office Visit from 11/24/2018 in [facility]  PHQ-2 Total Score 0 2 3 0  PHQ-9 Total Score -- 6 17 3     [place] Office Visit from 01/21/2021 in [facility] Counselor from 01/01/2021 in [facility] Video Visit from 12/26/2020 in [facility] No Risk No Risk No Risk       Assessment and Plan: Narcissa Melder is a 75 year old Caucasian female, married, lives in [place], has a history of bipolar disorder, GAD, insomnia, multiple medical problems including primary Parkinson's disease, hearing loss was evaluated in office today.  Patient is biologically predisposed given her multiple health issues.  Patient with hearing loss, cognitive issues, Parkinson's disease is currently making progress.  Plan as noted below.  Plan Bipolar disorder in remission Risperidone 2 mg p.o. daily Prozac 60 mg p.o. daily  GAD-stable Continue CBT with Ms. Christina Hussami Prozac 60 mg p.o. daily  Insomnia-stable Trazodone 50 to 100 mg p.o. nightly as needed Melatonin as needed  Cognitive disorder-patient to  continue to follow-up with neurology.  Collateral information obtained from husband.  Provided education about lightheadedness, balance issues likely multifactorial including medications, Parkinson's disease.  Patient advised to follow the 3-minute rule when she tries to change her position and to make use of support like cane or walker to avoid falls.  Follow-up in clinic in 6 to 8 weeks or sooner.  This note was generated in part or whole with voice recognition software. Voice recognition is usually quite accurate but there are transcription errors that can and very often do occur. I apologize for any typographical errors that were not detected and corrected.       Ursula Alert, MD 01/22/2021, 8:25 AM

## 2021-01-23 ENCOUNTER — Other Ambulatory Visit: Payer: Self-pay

## 2021-01-23 ENCOUNTER — Ambulatory Visit: Payer: Medicare PPO

## 2021-01-23 DIAGNOSIS — R262 Difficulty in walking, not elsewhere classified: Secondary | ICD-10-CM

## 2021-01-23 DIAGNOSIS — M25522 Pain in left elbow: Secondary | ICD-10-CM | POA: Diagnosis not present

## 2021-01-23 DIAGNOSIS — M25551 Pain in right hip: Secondary | ICD-10-CM | POA: Diagnosis not present

## 2021-01-23 DIAGNOSIS — M25622 Stiffness of left elbow, not elsewhere classified: Secondary | ICD-10-CM

## 2021-01-23 DIAGNOSIS — M25552 Pain in left hip: Secondary | ICD-10-CM | POA: Diagnosis not present

## 2021-01-23 DIAGNOSIS — M6281 Muscle weakness (generalized): Secondary | ICD-10-CM | POA: Diagnosis not present

## 2021-01-23 NOTE — Therapy (Signed)
[facility] [address] [place], [address] Phone: [phone]   Fax:  [phone]  Physical Therapy Treatment  Patient Details  Name: Kendra [name] MRN: [id number] Date of Birth: 26-Oct-1946 No data recorded  Encounter Date: 01/23/2021   PT End of Session - 01/23/21 1518    Visit Number [id number]    PT Start Time 1430    PT Stop Time 1511    PT Time Calculation (min) 41 min    Equipment Utilized During Treatment Gait belt    Activity Tolerance Patient tolerated treatment well    Behavior During Therapy [facility] for tasks assessed/performed           Past Medical History:  Diagnosis Date  . Anxiety   . Breast cancer ([place]) 03/2018   right breast cancer at 11:00 and 1:00  . Bursitis    bilateral hips and knees  . Complication of anesthesia   . Depression   . Family history of breast cancer 03/24/2018  . Family history of lung cancer 03/24/2018  . History of alcohol dependence ([place]) 2007   no ETOH; resolved since 2007  . History of hiatal hernia   . History of psychosis 2014   due to sleep disturbance  . Hypertension   . Parkinson disease ([place])   . Personal history of radiation therapy 06/2018-07/2018   right breast ca  . PONV (postoperative nausea and vomiting)     Past Surgical History:  Procedure Laterality Date  . BREAST BIOPSY Right 03/14/2018   11:00 DCIS and invasive ductal carcinoma  . BREAST BIOPSY Right 03/14/2018   1:00 Invasive ductal carcinoma  . BREAST LUMPECTOMY Right 04/27/2018   lumpectomy of 11 and 1:00 cancers, clear margins, negative LN  . BREAST LUMPECTOMY WITH RADIOACTIVE SEED AND SENTINEL LYMPH NODE BIOPSY Right 04/27/2018   Procedure: RIGHT BREAST LUMPECTOMY WITH RADIOACTIVE SEED X 2 AND RIGHT SENTINEL LYMPH NODE BIOPSY;  Surgeon: [name], MD;  Location: [place];  Service: General;  Laterality: Right;  . CATARACT EXTRACTION W/PHACO Left 08/30/2019    Procedure: CATARACT EXTRACTION PHACO AND INTRAOCULAR LENS PLACEMENT ([place]) LEFT panoptix toric  01:20.5  12.7%  10.26;  Surgeon: Leandrew Koyanagi, MD;  Location: [place];  Service: Ophthalmology;  Laterality: Left;  . CATARACT EXTRACTION W/PHACO Right 09/20/2019   Procedure: CATARACT EXTRACTION PHACO AND INTRAOCULAR LENS PLACEMENT ([facility]) RIGHT 5.71  00:36.4  15.7%;  Surgeon: Leandrew Koyanagi, MD;  Location: [place];  Service: Ophthalmology;  Laterality: Right;  . LAPAROTOMY N/A 02/10/2018   Procedure: EXPLORATORY LAPAROTOMY;  Surgeon: Isabel Caprice, MD;  Location: [facility];  Service: Gynecology;  Laterality: N/A;  . MASS EXCISION  01/2018   abdominal  . OVARIAN CYST REMOVAL    . SALPINGOOPHORECTOMY Bilateral 02/10/2018   Procedure: BILATERAL SALPINGO OOPHORECTOMY; PERITONEAL WASHINGS;  Surgeon: Isabel Caprice, MD;  Location: [facility];  Service: Gynecology;  Laterality: Bilateral;  . TONSILLECTOMY    . TONSILLECTOMY AND ADENOIDECTOMY  1953    There were no vitals filed for this visit.   Subjective Assessment - 01/23/21 1522    Subjective Patient reports a decrease in pain and dizziness today. Patient states that she did have an increase in pain a couple of days ago that eventually went away.    Pertinent History Patient report unclear regarding onset. Visited MD on Sept 16 and received injection with relief for approximately 1 month. Reports she cannot receive an additional injection  pending eye surgery. Received dx from MD of hip bursitis in B hips. Reports pain with walking, stanindg, sitting 5/10 current 7/10 worst 3/10 best, with temporary relief when she is sitting "just right" and with naproxen. Agg: activity. Cormorbidities: hx of breast cancer, HTN, MCI, insomnia, cataracts    Limitations Sitting;Standing;Walking;House hold activities    How long can you sit comfortably? Pain constant, reduced with neutral pelvic tilt    How long can you stand comfortably?  Pain constant, increases with time    How long can you walk comfortably? Pain constant, increases with time    Patient Stated Goals Be able to move without hurting, walk 1 hour    Currently in Pain? No/denies           TREATMENT Neuromuscular rehab Seated reach out, up back and down - x 20 Soccer kicks - x 20 B without UE support Backwards walking with ball toss 4 x 33dft Backwards walking with busy backgrounds - x186f in a circle Step ups/down 6" steps - 3 x 5 without UE support Wall rolls in standing - x 10   Performing exercises to address pain and spasms      PT Education - 01/23/21 1516    Education provided Yes    Education Details form/technique with exercise    Person(s) Educated Patient    Methods Explanation;Demonstration    Comprehension Verbalized understanding;Returned demonstration            PT Short Term Goals - 01/14/21 1029      PT SHORT TERM GOAL #1   Title Patient will demonstrate complaince with HEP to speed recovery and reduce total number of visits.    Baseline HEP given; 01/14/2021: Independent with HEP    Time 2    Period Weeks    Status Achieved    Target Date 09/27/19             PT Long Term Goals - 01/13/21 1328      PT LONG TERM GOAL #1   Title Patient will improve hip strength to 4/5 grossly to facilitate improved mobility with ADLs.    Baseline 3, 4-; 12/22 deferred to NV; 11/09/2019: 4-/5; 12/07/2019 L hip flexor 4, abd and ext 3+; 01/29/2020: 4/5 for most hip motions, 3+/5 for abd, ext; 03/20/2020: 4/5 for hip movements    Time 8    Period Weeks    Status Achieved      PT LONG TERM GOAL #2   Title Patient will demonstrate walking tolerance increased to 30 minutes without increase in symptoms to demonstrate improved ability to perform walking program for cardiovascular health.    Baseline 10 minute tolerance (patient report); 12/22 10 minutes (per pt report); 11/09/2019: 7 min; 12/07/2019 unable to assess due to icy streets;   01/29/2020: 10 min; 03/20/2020 10 min; 05/14/2020: 10 min x 3 per day; 06/11/2020: 159m x 3 per day; 07/15/2020: 1047mx 3 per day; 09/02/2020: 51m102m 3 (just standing rest breaks in between); 11/06/2019: 10 min x 3    Time 8    Period Weeks    Status Partially Met      PT LONG TERM GOAL #3   Title Patient will report worst pain of 3/10 during therapy to demonstrate reduced disability    Baseline Worst 7/10; 12/22 worst 9/10; 11/09/2019: 9/10; 12/07/2019 7/10; 01/29/2020: 7/10; 05/14/2020: 0/ 10    Time 8    Period Weeks    Status Achieved  PT LONG TERM GOAL #4   Title Patient will demonstrate ability to perform all bed mobility maneuvers without increase in pain for reduced disability with mobility and transfers with ADLs.    Baseline Reports pain with bed mobility supine <> sidelying L/R, sidelying to sitting EOB; 12/22 pt able to perform I without increased pain    Time 8    Period Weeks    Status Achieved      PT LONG TERM GOAL #5   Title Patient will improve 10MW speed to 1.0 m/sec to meet norms for minimal fall risk and full community ambulation.    Baseline 0.88 m/sec; 12/22 0.96 m/sec; 11/09/2019: deferred; 12/07/2019 0.90 m/sec; 01/29/2020: .9  m/sec; 03/20/2020: .9m/s; 05/14/2020: . 9 m/s; 06/11/2020: .9m/s; 07/15/2020: 1 m/s    Time 8    Period Weeks    Status Achieved      Additional Long Term Goals   Additional Long Term Goals Yes      PT LONG TERM GOAL #6   Title Patient will increase 6MWT by 200 ft to demonstrate improvement in functional capacity for transition to cardiac walking program    Baseline 6MWT = 1195; 1/7/20201: deferred; 12/07/2019 1095; 01/29/2020: deferred secondary to fatigue; 03/20/2020: 1070ft; 07/15/2020: 1130ft; 09/03/2020: 800ft; 11/05/2020: 973ft; 01/13/2021: 1000ft    Time 8    Period Weeks    Status On-going      PT LONG TERM GOAL #7   Title Patient will improve her 5xsts to under 15 sec to indicate singificnat improvement in LE functional strength and decrease in  fall risk.    Baseline 24.5 sec; 03/20/2020: 19.19 sec; 06/13/2020: 14. 92    Time 6    Period Weeks    Status Achieved      PT LONG TERM GOAL #8   Title Patient will improve FGA to 28/30 to indicate improvement in dynamic balance and improvement in stability.    Baseline 23/30; 06/11/2020: 25/30; 07/15/2020: 26/30; 09/02/2020: 27/30; 11/05/2020: 28/30    Time 6    Period Weeks    Status Achieved      PT LONG TERM GOAL  #9   TITLE Patient will be able to ascend/desend the stairs without use of UEs to increase safety and decrease fall risk with stair use.    Baseline requires use of UEs; 01/13/2021: able to perform with CGA    Time 6    Period Weeks    Status Achieved      PT LONG TERM GOAL  #10   TITLE Patient will improve her TUG scores to under 12 sec for normal tug; 15 sec for cognitive TUG to decrease fall risk overall    Baseline normal: 17.2; 27.3 sec    Time 6    Period Weeks    Status New                 Plan - 01/23/21 1532    Clinical Impression Statement Patient demonstrates increased improvement with walking backwards and ascending/descending stairs. Patient has some difficulty with balance and gaze deviation activities that challange the vestibular system. Patient does require frequent rest breaks between activities. Patient would benefit from skilled therapy to return to prior level of function.    Personal Factors and Comorbidities Age;Comorbidity 3+;Time since onset of injury/illness/exacerbation;Comorbidity 2;Other    Comorbidities MCI, HTN, CA, cataracts    Examination-Activity Limitations Bend;Sit;Squat;Stairs;Stand;Sleep    Examination-Participation Restrictions Cleaning;Community Activity    Stability/Clinical Decision Making Evolving/Moderate complexity      Rehab Potential Fair    Clinical Impairments Affecting Rehab Potential MCI    PT Frequency 2x / week    PT Duration 6 weeks    PT Treatment/Interventions ADLs/Self Care Home Management;Therapeutic  exercise;Patient/family education;Neuromuscular re-education;Therapeutic activities;Functional mobility training;Balance training;Manual techniques;Gait training;Stair training;Moist Heat;Electrical Stimulation;Cryotherapy;Taping;Dry needling;Energy conservation;Passive range of motion;Joint Manipulations    PT Next Visit Plan Progress glute therex, pelvic-hip dissociation; check and progress HEP with stretches    PT Home Exercise Plan lateral step ups, step ups, hip kickers ext/abd red theraband    Consulted and Agree with Plan of Care Patient           Patient will benefit from skilled therapeutic intervention in order to improve the following deficits and impairments:  Decreased range of motion,Postural dysfunction,Pain,Abnormal gait,Decreased activity tolerance,Decreased coordination,Decreased endurance,Decreased balance,Decreased mobility,Difficulty walking,Hypomobility,Increased muscle spasms,Impaired perceived functional ability,Decreased strength,Impaired flexibility  Visit Diagnosis: Difficulty in walking, not elsewhere classified  Muscle weakness (generalized)  Pain in left hip  Pain in right hip  Stiffness of left elbow, not elsewhere classified  Pain in left elbow     Problem List Patient Active Problem List   Diagnosis Date Noted  . Osteopenia of neck of right femur 09/06/2020  . Class 1 obesity with serious comorbidity and body mass index (BMI) of 32.0 to 32.9 in adult 08/29/2020  . Candidal skin infection 04/29/2020  . Parkinson disease ([place]) 02/22/2020  . 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 Bipolar disorder, in full remission, most recent episode depressed ([place]) 11/08/2019  . Avitaminosis D 08/24/2019  . Insomnia due to mental condition 05/31/2019  . Bipolar I disorder, most recent episode depressed ([place]) 05/31/2019  . Cognitive disorder 05/31/2019  . Alcohol use disorder, moderate, in sustained remission ([place]) 05/31/2019  . Elevated tumor markers 05/27/2019   . GAD (generalized anxiety disorder) 04/10/2019  . MDD (major depressive disorder), severe ([place]) 01/31/2019  . High serum carbohydrate antigen 19-9 (CA19-9) 12/01/2018  . Adjustment disorder with anxious mood 11/24/2018  . Essential hypertension 11/24/2018  . B12 deficiency 10/17/2018  . Incisional hernia, without obstruction or gangrene 09/12/2018  . Genetic testing 04/07/2018  . Malignant neoplasm of upper-outer quadrant of right breast in female, estrogen receptor positive ([place]) 03/17/2018  . Malignant neoplasm of upper-inner quadrant of right breast in female, estrogen receptor positive ([place]) 03/17/2018  . History of psychosis 02/07/2018  . History of insomnia 02/07/2018    Jaynie Crumble, SPT 01/23/2021, 3:51 PM  [facility] [address] [place], [address] Phone: [phone]   Fax:  [phone]  Name: TRUE Shackleford MRN: [id number] Date of Birth: 1946-08-29

## 2021-01-27 ENCOUNTER — Other Ambulatory Visit: Payer: Self-pay

## 2021-01-27 ENCOUNTER — Ambulatory Visit: Payer: Medicare PPO

## 2021-01-27 DIAGNOSIS — M6281 Muscle weakness (generalized): Secondary | ICD-10-CM | POA: Diagnosis not present

## 2021-01-27 DIAGNOSIS — M25552 Pain in left hip: Secondary | ICD-10-CM

## 2021-01-27 DIAGNOSIS — M25622 Stiffness of left elbow, not elsewhere classified: Secondary | ICD-10-CM | POA: Diagnosis not present

## 2021-01-27 DIAGNOSIS — R262 Difficulty in walking, not elsewhere classified: Secondary | ICD-10-CM | POA: Diagnosis not present

## 2021-01-27 DIAGNOSIS — M25551 Pain in right hip: Secondary | ICD-10-CM | POA: Diagnosis not present

## 2021-01-27 DIAGNOSIS — M25522 Pain in left elbow: Secondary | ICD-10-CM

## 2021-01-27 NOTE — Therapy (Signed)
[facility] [address] [place], [address] Phone: [phone]   Fax:  [phone]  Physical Therapy Treatment  Patient Details  Name: Kendra [name] MRN: [id number] Date of Birth: [birth year] No data recorded  Encounter Date: 01/27/2021   PT End of Session - 01/27/21 1341    Visit Number 54    Number of Visits 100    Date for PT Re-Evaluation 04/02/21    Authorization Type Recert at visit 87    Authorization Time Period --    PT Start Time 1115    PT Stop Time 1200    PT Time Calculation (min) 45 min    Equipment Utilized During Treatment Gait belt    Activity Tolerance Patient tolerated treatment well    Behavior During Therapy WFL for tasks assessed/performed           Past Medical History:  Diagnosis Date  . Anxiety   . Breast cancer ([place]) 03/2018   right breast cancer at 11:00 and 1:00  . Bursitis    bilateral hips and knees  . Complication of anesthesia   . Depression   . Family history of breast cancer 03/24/2018  . Family history of lung cancer 03/24/2018  . History of alcohol dependence ([place]) 2007   no ETOH; resolved since 2007  . History of hiatal hernia   . History of psychosis 2014   due to sleep disturbance  . Hypertension   . Parkinson disease ([place])   . Personal history of radiation therapy 06/2018-07/2018   right breast ca  . PONV (postoperative nausea and vomiting)     Past Surgical History:  Procedure Laterality Date  . BREAST BIOPSY Right 03/14/2018   11:00 DCIS and invasive ductal carcinoma  . BREAST BIOPSY Right 03/14/2018   1:00 Invasive ductal carcinoma  . BREAST LUMPECTOMY Right 04/27/2018   lumpectomy of 11 and 1:00 cancers, clear margins, negative LN  . BREAST LUMPECTOMY WITH RADIOACTIVE SEED AND SENTINEL LYMPH NODE BIOPSY Right 04/27/2018   Procedure: RIGHT BREAST LUMPECTOMY WITH RADIOACTIVE SEED X 2 AND RIGHT SENTINEL LYMPH NODE BIOPSY;  Surgeon: [name], MD;  Location: [facility];  Service: General;  Laterality: Right;  . CATARACT EXTRACTION W/PHACO Left 08/30/2019   Procedure: CATARACT EXTRACTION PHACO AND INTRAOCULAR LENS PLACEMENT ([facility]) LEFT panoptix toric  01:20.5  12.7%  10.26;  Surgeon: Leandrew Koyanagi, MD;  Location: [facility];  Service: Ophthalmology;  Laterality: Left;  . CATARACT EXTRACTION W/PHACO Right 09/20/2019   Procedure: CATARACT EXTRACTION PHACO AND INTRAOCULAR LENS PLACEMENT ([facility]) RIGHT 5.71  00:36.4  15.7%;  Surgeon: Leandrew Koyanagi, MD;  Location: [facility];  Service: Ophthalmology;  Laterality: Right;  . LAPAROTOMY N/A 02/10/2018   Procedure: EXPLORATORY LAPAROTOMY;  Surgeon: Isabel Caprice, MD;  Location: [facility];  Service: Gynecology;  Laterality: N/A;  . MASS EXCISION  01/2018   abdominal  . OVARIAN CYST REMOVAL    . SALPINGOOPHORECTOMY Bilateral 02/10/2018   Procedure: BILATERAL SALPINGO OOPHORECTOMY; PERITONEAL WASHINGS;  Surgeon: Isabel Caprice, MD;  Location: [facility];  Service: Gynecology;  Laterality: Bilateral;  . TONSILLECTOMY    . TONSILLECTOMY AND ADENOIDECTOMY  1953    There were no vitals filed for this visit.   Subjective Assessment - 01/27/21 1337    Subjective Patient reports no increase in symptoms over the weekend. Patient states that she felt good up until having to come to therapy, did have a little dizziness.  Pertinent History Patient report unclear regarding onset. Visited MD on Sept 16 and received injection with relief for approximately 1 month. Reports she cannot receive an additional injection pending eye surgery. Received dx from MD of hip bursitis in B hips. Reports pain with walking, stanindg, sitting 5/10 current 7/10 worst 3/10 best, with temporary relief when she is sitting "just right" and with naproxen. Agg: activity. Cormorbidities: hx of breast cancer, HTN, MCI, insomnia, cataracts    Limitations Sitting;Standing;Walking;House  hold activities    How long can you sit comfortably? Pain constant, reduced with neutral pelvic tilt    How long can you stand comfortably? Pain constant, increases with time    How long can you walk comfortably? Pain constant, increases with time    Patient Stated Goals Be able to move without hurting, walk 1 hour    Currently in Pain? No/denies    Pain Onset More than a month ago             TREATMENT Neuromuscular rehab Seated reach out, up back and down - x 20 Soccer kicks - x 20 B without UE support Backwards walking with ball toss 4 x 63ft Step ups/down 6" steps - 3 x 5 without UE support Wall rolls in standing - x 10 Forward walking looking up/down, left/right 2 x 33 ft Forward walking looking EC up/down, left right 2 x 33 ft   Performing exercises to address pain and spasms     PT Education - 01/27/21 1341    Education provided Yes    Education Details form/technique with exercise    Person(s) Educated Patient    Methods Explanation;Demonstration    Comprehension Returned demonstration;Verbalized understanding            PT Short Term Goals - 01/14/21 1029      PT SHORT TERM GOAL #1   Title Patient will demonstrate complaince with HEP to speed recovery and reduce total number of visits.    Baseline HEP given; 01/14/2021: Independent with HEP    Time 2    Period Weeks    Status Achieved    Target Date 09/27/19             PT Long Term Goals - 01/13/21 1328      PT LONG TERM GOAL #1   Title Patient will improve hip strength to 4/5 grossly to facilitate improved mobility with ADLs.    Baseline 3, 4-; 12/22 deferred to NV; 11/09/2019: 4-/5; 12/07/2019 L hip flexor 4, abd and ext 3+; 01/29/2020: 4/5 for most hip motions, 3+/5 for abd, ext; 03/20/2020: 4/5 for hip movements    Time 8    Period Weeks    Status Achieved      PT LONG TERM GOAL #2   Title Patient will demonstrate walking tolerance increased to 30 minutes without increase in symptoms to  demonstrate improved ability to perform walking program for cardiovascular health.    Baseline 10 minute tolerance (patient report); 12/22 10 minutes (per pt report); 11/09/2019: 7 min; 12/07/2019 unable to assess due to icy streets;  01/29/2020: 10 min; 03/20/2020 10 min; 05/14/2020: 10 min x 3 per day; 06/11/2020: 65min x 3 per day; 07/15/2020: 43min x 3 per day; 09/02/2020: 27min x 3 (just standing rest breaks in between); 11/06/2019: 10 min x 3    Time 8    Period Weeks    Status Partially Met      PT LONG TERM GOAL #3   Title Patient will  report worst pain of 3/10 during therapy to demonstrate reduced disability    Baseline Worst 7/10; 12/22 worst 9/10; 11/09/2019: 9/10; 12/07/2019 7/10; 01/29/2020: 7/10; 05/14/2020: 0/ 10    Time 8    Period Weeks    Status Achieved      PT LONG TERM GOAL #4   Title Patient will demonstrate ability to perform all bed mobility maneuvers without increase in pain for reduced disability with mobility and transfers with ADLs.    Baseline Reports pain with bed mobility supine <> sidelying L/R, sidelying to sitting EOB; 12/22 pt able to perform I without increased pain    Time 8    Period Weeks    Status Achieved      PT LONG TERM GOAL #5   Title Patient will improve 10MW speed to 1.0 m/sec to meet norms for minimal fall risk and full community ambulation.    Baseline 0.88 m/sec; 12/22 0.96 m/sec; 11/09/2019: deferred; 12/07/2019 0.90 m/sec; 01/29/2020: .9  m/sec; 03/20/2020: .46m/s; 05/14/2020: . 9 m/s; 06/11/2020: .58m/s; 07/15/2020: 1 m/s    Time 8    Period Weeks    Status Achieved      Additional Long Term Goals   Additional Long Term Goals Yes      PT LONG TERM GOAL #6   Title Patient will increase 6MWT by 200 ft to demonstrate improvement in functional capacity for transition to cardiac walking program    Baseline 6MWT = 1195; 1/7/20201: deferred; 12/07/2019 1095; 01/29/2020: deferred secondary to fatigue; 03/20/2020: 1055ft; 07/15/2020: 1185ft; 09/03/2020: 877ft; 11/05/2020:  966ft; 01/13/2021: 1064ft    Time 8    Period Weeks    Status On-going      PT LONG TERM GOAL #7   Title Patient will improve her 5xsts to under 15 sec to indicate singificnat improvement in LE functional strength and decrease in fall risk.    Baseline 24.5 sec; 03/20/2020: 19.19 sec; 06/13/2020: 14. 92    Time 6    Period Weeks    Status Achieved      PT LONG TERM GOAL #8   Title Patient will improve FGA to 28/30 to indicate improvement in dynamic balance and improvement in stability.    Baseline 23/30; 06/11/2020: 25/30; 07/15/2020: 26/30; 09/02/2020: 27/30; 11/05/2020: 28/30    Time 6    Period Weeks    Status Achieved      PT LONG TERM GOAL  #9   TITLE Patient will be able to ascend/desend the stairs without use of UEs to increase safety and decrease fall risk with stair use.    Baseline requires use of UEs; 01/13/2021: able to perform with CGA    Time 6    Period Weeks    Status Achieved      PT LONG TERM GOAL  #10   TITLE Patient will improve her TUG scores to under 12 sec for normal tug; 15 sec for cognitive TUG to decrease fall risk overall    Baseline normal: 17.2; 27.3 sec    Time 6    Period Weeks    Status New                 Plan - 01/27/21 1343    Clinical Impression Statement Patient demosntrates an increase in gaze deviation while walking backwards and tossing a ball. However, patient still has some increased dizziness while doing wall rolls in standing. Patient still has to take frequent rest breaks throughout the therapy session. Patient will benefit from  skilled physical therapy to return to prior level of function.    Personal Factors and Comorbidities Age;Comorbidity 3+;Time since onset of injury/illness/exacerbation;Comorbidity 2;Other    Comorbidities MCI, HTN, CA, cataracts    Examination-Activity Limitations Bend;Sit;Squat;Stairs;Stand;Sleep    Examination-Participation Restrictions Cleaning;Community Activity    Stability/Clinical Decision Making  Evolving/Moderate complexity    Rehab Potential Fair    Clinical Impairments Affecting Rehab Potential MCI    PT Frequency 2x / week    PT Duration 6 weeks    PT Treatment/Interventions ADLs/Self Care Home Management;Therapeutic exercise;Patient/family education;Neuromuscular re-education;Therapeutic activities;Functional mobility training;Balance training;Manual techniques;Gait training;Stair training;Moist Heat;Electrical Stimulation;Cryotherapy;Taping;Dry needling;Energy conservation;Passive range of motion;Joint Manipulations    PT Next Visit Plan Progress glute therex, pelvic-hip dissociation; check and progress HEP with stretches    PT Home Exercise Plan lateral step ups, step ups, hip kickers ext/abd red theraband    Consulted and Agree with Plan of Care Patient           Patient will benefit from skilled therapeutic intervention in order to improve the following deficits and impairments:  Decreased range of motion,Postural dysfunction,Pain,Abnormal gait,Decreased activity tolerance,Decreased coordination,Decreased endurance,Decreased balance,Decreased mobility,Difficulty walking,Hypomobility,Increased muscle spasms,Impaired perceived functional ability,Decreased strength,Impaired flexibility  Visit Diagnosis: Pain in left elbow  Difficulty in walking, not elsewhere classified  Muscle weakness (generalized)  Pain in left hip  Stiffness of left elbow, not elsewhere classified  Pain in right hip     Problem List Patient Active Problem List   Diagnosis Date Noted  . Osteopenia of neck of right femur 09/06/2020  . Class 1 obesity with serious comorbidity and body mass index (BMI) of 32.0 to 32.9 in adult 08/29/2020  . Candidal skin infection 04/29/2020  . Parkinson disease ([place]) 02/22/2020  . 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 Bipolar disorder, in full remission, most recent episode depressed ([place]) 11/08/2019  . Avitaminosis D 08/24/2019  . Insomnia due to mental  condition 05/31/2019  . Bipolar I disorder, most recent episode depressed ([place]) 05/31/2019  . Cognitive disorder 05/31/2019  . Alcohol use disorder, moderate, in sustained remission ([place]) 05/31/2019  . Elevated tumor markers 05/27/2019  . GAD (generalized anxiety disorder) 04/10/2019  . MDD (major depressive disorder), severe ([place]) 01/31/2019  . High serum carbohydrate antigen 19-9 (CA19-9) 12/01/2018  . Adjustment disorder with anxious mood 11/24/2018  . Essential hypertension 11/24/2018  . B12 deficiency 10/17/2018  . Incisional hernia, without obstruction or gangrene 09/12/2018  . Genetic testing 04/07/2018  . Malignant neoplasm of upper-outer quadrant of right breast in female, estrogen receptor positive ([place]) 03/17/2018  . Malignant neoplasm of upper-inner quadrant of right breast in female, estrogen receptor positive ([place]) 03/17/2018  . History of psychosis 02/07/2018  . History of insomnia 02/07/2018    Jaynie Crumble, SPT 01/27/2021, 2:38 PM  [facility] [address] [place], [address] Phone: [phone]   Fax:  [phone]  Name: Teaghan Melrose MRN: [id number] Date of Birth: September 17, 1946

## 2021-01-28 ENCOUNTER — Ambulatory Visit: Payer: Medicare PPO | Admitting: Psychiatry

## 2021-01-29 ENCOUNTER — Ambulatory Visit: Payer: Medicare PPO

## 2021-01-29 ENCOUNTER — Other Ambulatory Visit: Payer: Self-pay

## 2021-01-29 DIAGNOSIS — M25551 Pain in right hip: Secondary | ICD-10-CM | POA: Diagnosis not present

## 2021-01-29 DIAGNOSIS — M6281 Muscle weakness (generalized): Secondary | ICD-10-CM | POA: Diagnosis not present

## 2021-01-29 DIAGNOSIS — M25622 Stiffness of left elbow, not elsewhere classified: Secondary | ICD-10-CM | POA: Diagnosis not present

## 2021-01-29 DIAGNOSIS — R262 Difficulty in walking, not elsewhere classified: Secondary | ICD-10-CM

## 2021-01-29 DIAGNOSIS — M25552 Pain in left hip: Secondary | ICD-10-CM | POA: Diagnosis not present

## 2021-01-29 DIAGNOSIS — M25522 Pain in left elbow: Secondary | ICD-10-CM

## 2021-01-29 NOTE — Therapy (Signed)
[facility] [address], [place], [address] Phone: [phone]   Fax:  [phone]  Physical Therapy Treatment  Patient Details  Name: Kendra [name] MRN: [id number] Date of Birth: Nov 30, 1945 No data recorded  Encounter Date: 01/29/2021   PT End of Session - 01/29/21 1158    Visit Number 95    Number of Visits 100    Date for PT Re-Evaluation 04/02/21    Authorization Type Recert at visit 28    PT Start Time 1115    PT Stop Time 1158    PT Time Calculation (min) 43 min    Equipment Utilized During Treatment Gait belt    Activity Tolerance Patient tolerated treatment well    Behavior During Therapy WFL for tasks assessed/performed           Past Medical History:  Diagnosis Date  . Anxiety   . Breast cancer ([place]) 03/2018   right breast cancer at 11:00 and 1:00  . Bursitis    bilateral hips and knees  . Complication of anesthesia   . Depression   . Family history of breast cancer 03/24/2018  . Family history of lung cancer 03/24/2018  . History of alcohol dependence ([place]) 2007   no ETOH; resolved since 2007  . History of hiatal hernia   . History of psychosis 2014   due to sleep disturbance  . Hypertension   . Parkinson disease ([place])   . Personal history of radiation therapy 06/2018-07/2018   right breast ca  . PONV (postoperative nausea and vomiting)     Past Surgical History:  Procedure Laterality Date  . BREAST BIOPSY Right 03/14/2018   11:00 DCIS and invasive ductal carcinoma  . BREAST BIOPSY Right 03/14/2018   1:00 Invasive ductal carcinoma  . BREAST LUMPECTOMY Right 04/27/2018   lumpectomy of 11 and 1:00 cancers, clear margins, negative LN  . BREAST LUMPECTOMY WITH RADIOACTIVE SEED AND SENTINEL LYMPH NODE BIOPSY Right 04/27/2018   Procedure: RIGHT BREAST LUMPECTOMY WITH RADIOACTIVE SEED X 2 AND RIGHT SENTINEL LYMPH NODE BIOPSY;  Surgeon: [name], MD;  Location: [facility];  Service: General;  Laterality: Right;  . CATARACT EXTRACTION W/PHACO Left 08/30/2019   Procedure: CATARACT EXTRACTION PHACO AND INTRAOCULAR LENS PLACEMENT ([place]) LEFT panoptix toric  01:20.5  12.7%  10.26;  Surgeon: Leandrew Koyanagi, MD;  Location: [facility];  Service: Ophthalmology;  Laterality: Left;  . CATARACT EXTRACTION W/PHACO Right 09/20/2019   Procedure: CATARACT EXTRACTION PHACO AND INTRAOCULAR LENS PLACEMENT ([facility]) RIGHT 5.71  00:36.4  15.7%;  Surgeon: Leandrew Koyanagi, MD;  Location: [facility];  Service: Ophthalmology;  Laterality: Right;  . LAPAROTOMY N/A 02/10/2018   Procedure: EXPLORATORY LAPAROTOMY;  Surgeon: Isabel Caprice, MD;  Location: [facility];  Service: Gynecology;  Laterality: N/A;  . MASS EXCISION  01/2018   abdominal  . OVARIAN CYST REMOVAL    . SALPINGOOPHORECTOMY Bilateral 02/10/2018   Procedure: BILATERAL SALPINGO OOPHORECTOMY; PERITONEAL WASHINGS;  Surgeon: Isabel Caprice, MD;  Location: [facility];  Service: Gynecology;  Laterality: Bilateral;  . TONSILLECTOMY    . TONSILLECTOMY AND ADENOIDECTOMY  1953    There were no vitals filed for this visit.   Subjective Assessment - 01/29/21 1120    Subjective Patient states that she had no dizziness/trouble getting up this morning.    Pertinent History Patient report unclear regarding onset. Visited MD on Sept 16 and received injection with relief for approximately 1 month.  Reports she cannot receive an additional injection pending eye surgery. Received dx from MD of hip bursitis in B hips. Reports pain with walking, stanindg, sitting 5/10 current 7/10 worst 3/10 best, with temporary relief when she is sitting "just right" and with naproxen. Agg: activity. Cormorbidities: hx of breast cancer, HTN, MCI, insomnia, cataracts    Limitations Sitting;Standing;Walking;House hold activities    How long can you sit comfortably? Pain constant, reduced with neutral pelvic tilt    How long can you  stand comfortably? Pain constant, increases with time    How long can you walk comfortably? Pain constant, increases with time    Patient Stated Goals Be able to move without hurting, walk 1 hour    Currently in Pain? No/denies    Pain Onset More than a month ago           TREATMENT Neuromuscular rehab Seated reach out, up back and down - x 20 Soccer kicks - x 20 B without UE support Backwards walking with ball toss 4 x 33ft Step ups/down 6" steps - 3 x 5 without UE support Wall rolls in standing - x 10 Forward walking looking up/down, left/right 4 x 33 ft Walking forwards 359ft  Walking backwards 124ft    Performed exercises to address dizziness        PT Education - 01/29/21 1157    Education provided Yes    Education Details form/technique with exercise    Person(s) Educated Patient    Methods Explanation;Demonstration    Comprehension Verbalized understanding;Returned demonstration            PT Short Term Goals - 01/14/21 1029      PT SHORT TERM GOAL #1   Title Patient will demonstrate complaince with HEP to speed recovery and reduce total number of visits.    Baseline HEP given; 01/14/2021: Independent with HEP    Time 2    Period Weeks    Status Achieved    Target Date 09/27/19             PT Long Term Goals - 01/13/21 1328      PT LONG TERM GOAL #1   Title Patient will improve hip strength to 4/5 grossly to facilitate improved mobility with ADLs.    Baseline 3, 4-; 12/22 deferred to NV; 11/09/2019: 4-/5; 12/07/2019 L hip flexor 4, abd and ext 3+; 01/29/2020: 4/5 for most hip motions, 3+/5 for abd, ext; 03/20/2020: 4/5 for hip movements    Time 8    Period Weeks    Status Achieved      PT LONG TERM GOAL #2   Title Patient will demonstrate walking tolerance increased to 30 minutes without increase in symptoms to demonstrate improved ability to perform walking program for cardiovascular health.    Baseline 10 minute tolerance (patient report); 12/22 10  minutes (per pt report); 11/09/2019: 7 min; 12/07/2019 unable to assess due to icy streets;  01/29/2020: 10 min; 03/20/2020 10 min; 05/14/2020: 10 min x 3 per day; 06/11/2020: x 3 per day; 07/15/2020: x 3 per day; 09/02/2020: x 3 (just standing rest breaks in between); 11/06/2019: 10 min x 3    Time 8    Period Weeks    Status Partially Met      PT LONG TERM GOAL #3   Title Patient will report worst pain of 3/10 during therapy to demonstrate reduced disability    Baseline Worst 7/10; 12/22 worst 9/10; 11/09/2019: 9/10; 12/07/2019 7/10; 01/29/2020:  7/10; 05/14/2020: 0/ 10    Time 8    Period Weeks    Status Achieved      PT LONG TERM GOAL #4   Title Patient will demonstrate ability to perform all bed mobility maneuvers without increase in pain for reduced disability with mobility and transfers with ADLs.    Baseline Reports pain with bed mobility supine <> sidelying L/R, sidelying to sitting EOB; 12/22 pt able to perform I without increased pain    Time 8    Period Weeks    Status Achieved      PT LONG TERM GOAL #5   Title Patient will improve 10MW speed to 1.0 m/sec to meet norms for minimal fall risk and full community ambulation.    Baseline 0.88 m/sec; 12/22 0.96 m/sec; 11/09/2019: deferred; 12/07/2019 0.90 m/sec; 01/29/2020: .9  m/sec; 03/20/2020: .62m/s; 05/14/2020: . 9 m/s; 06/11/2020: .10m/s; 07/15/2020: 1 m/s    Time 8    Period Weeks    Status Achieved      Additional Long Term Goals   Additional Long Term Goals Yes      PT LONG TERM GOAL #6   Title Patient will increase 6MWT by 200 ft to demonstrate improvement in functional capacity for transition to cardiac walking program    Baseline 6MWT = 1195; 1/7/20201: deferred; 12/07/2019 1095; 01/29/2020: deferred secondary to fatigue; 03/20/2020: 1052ft; 07/15/2020: 1154ft; 09/03/2020: 877ft; 11/05/2020: 922ft; 01/13/2021: 1074ft    Time 8    Period Weeks    Status On-going      PT LONG TERM GOAL #7   Title Patient will improve her 5xsts to  under 15 sec to indicate singificnat improvement in LE functional strength and decrease in fall risk.    Baseline 24.5 sec; 03/20/2020: 19.19 sec; 06/13/2020: 14. 92    Time 6    Period Weeks    Status Achieved      PT LONG TERM GOAL #8   Title Patient will improve FGA to 28/30 to indicate improvement in dynamic balance and improvement in stability.    Baseline 23/30; 06/11/2020: 25/30; 07/15/2020: 26/30; 09/02/2020: 27/30; 11/05/2020: 28/30    Time 6    Period Weeks    Status Achieved      PT LONG TERM GOAL  #9   TITLE Patient will be able to ascend/desend the stairs without use of UEs to increase safety and decrease fall risk with stair use.    Baseline requires use of UEs; 01/13/2021: able to perform with CGA    Time 6    Period Weeks    Status Achieved      PT LONG TERM GOAL  #10   TITLE Patient will improve her TUG scores to under 12 sec for normal tug; 15 sec for cognitive TUG to decrease fall risk overall    Baseline normal: 17.2; 27.3 sec    Time 6    Period Weeks    Status New                 Plan - 01/29/21 1215    Clinical Impression Statement Patient is able to ambulate forwards/backwards more efficiently without increased dizziness. Patient did display some dizziness and fatigue toward the end of the session after doing some walking w/ head turns and kicking a soccer ball. Patient will benefit from skilled therapy to return to prior level of function.    Personal Factors and Comorbidities Age;Comorbidity 3+;Time since onset of injury/illness/exacerbation;Comorbidity 2;Other    Comorbidities MCI,  HTN, CA, cataracts    Examination-Activity Limitations Bend;Sit;Squat;Stairs;Stand;Sleep    Examination-Participation Restrictions Cleaning;Community Activity    Stability/Clinical Decision Making Evolving/Moderate complexity    Rehab Potential Fair    Clinical Impairments Affecting Rehab Potential MCI    PT Frequency 2x / week    PT Duration 6 weeks    PT  Treatment/Interventions ADLs/Self Care Home Management;Therapeutic exercise;Patient/family education;Neuromuscular re-education;Therapeutic activities;Functional mobility training;Balance training;Manual techniques;Gait training;Stair training;Moist Heat;Electrical Stimulation;Cryotherapy;Taping;Dry needling;Energy conservation;Passive range of motion;Joint Manipulations    PT Next Visit Plan Progress glute therex, pelvic-hip dissociation; check and progress HEP with stretches    PT Home Exercise Plan lateral step ups, step ups, hip kickers ext/abd red theraband    Consulted and Agree with Plan of Care Patient           Patient will benefit from skilled therapeutic intervention in order to improve the following deficits and impairments:  Decreased range of motion,Postural dysfunction,Pain,Abnormal gait,Decreased activity tolerance,Decreased coordination,Decreased endurance,Decreased balance,Decreased mobility,Difficulty walking,Hypomobility,Increased muscle spasms,Impaired perceived functional ability,Decreased strength,Impaired flexibility  Visit Diagnosis: Pain in left elbow  Pain in right hip  Difficulty in walking, not elsewhere classified  Muscle weakness (generalized)  Pain in left hip  Stiffness of left elbow, not elsewhere classified     Problem List Patient Active Problem List   Diagnosis Date Noted  . Osteopenia of neck of right femur 09/06/2020  . Class 1 obesity with serious comorbidity and body mass index (BMI) of 32.0 to 32.9 in adult 08/29/2020  . Candidal skin infection 04/29/2020  . Parkinson disease ([place]) 02/22/2020  . 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 Bipolar disorder, in full remission, most recent episode depressed ([place]) 11/08/2019  . Avitaminosis D 08/24/2019  . Insomnia due to mental condition 05/31/2019  . Bipolar I disorder, most recent episode depressed ([place]) 05/31/2019  . Cognitive disorder 05/31/2019  . Alcohol use disorder, moderate, in sustained  remission ([place]) 05/31/2019  . Elevated tumor markers 05/27/2019  . GAD (generalized anxiety disorder) 04/10/2019  . MDD (major depressive disorder), severe ([place]) 01/31/2019  . High serum carbohydrate antigen 19-9 (CA19-9) 12/01/2018  . Adjustment disorder with anxious mood 11/24/2018  . Essential hypertension 11/24/2018  . B12 deficiency 10/17/2018  . Incisional hernia, without obstruction or gangrene 09/12/2018  . Genetic testing 04/07/2018  . Malignant neoplasm of upper-outer quadrant of right breast in female, estrogen receptor positive ([place]) 03/17/2018  . Malignant neoplasm of upper-inner quadrant of right breast in female, estrogen receptor positive ([place]) 03/17/2018  . History of psychosis 02/07/2018  . History of insomnia 02/07/2018    Jaynie Crumble, SPT 01/29/2021, 12:19 PM  [facility] [address] [place], [address] Phone: [phone]   Fax:  [phone]  Name: Taron Conrey MRN: [id number] Date of Birth: 05-26-1946

## 2021-01-30 ENCOUNTER — Ambulatory Visit
Admission: RE | Admit: 2021-01-30 | Discharge: 2021-01-30 | Disposition: A | Discharge status: Home / Self Care | Payer: Medicare PPO | Source: Ambulatory Visit | Attending: Radiation Oncology | Admitting: Radiation Oncology

## 2021-01-30 ENCOUNTER — Encounter: Payer: Self-pay | Admitting: Radiation Oncology

## 2021-01-30 VITALS — BP 114/62 | HR 94 | Temp 97.0°F | Wt 206.2 lb

## 2021-01-30 DIAGNOSIS — Z08 Encounter for follow-up examination after completed treatment for malignant neoplasm: Secondary | ICD-10-CM | POA: Diagnosis not present

## 2021-01-30 DIAGNOSIS — Z79811 Long term (current) use of aromatase inhibitors: Secondary | ICD-10-CM | POA: Insufficient documentation

## 2021-01-30 DIAGNOSIS — Z170 Estrogen receptor positive status [ER+]: Secondary | ICD-10-CM | POA: Diagnosis not present

## 2021-01-30 DIAGNOSIS — Z923 Personal history of irradiation: Secondary | ICD-10-CM | POA: Diagnosis not present

## 2021-01-30 DIAGNOSIS — C50411 Malignant neoplasm of upper-outer quadrant of right female breast: Secondary | ICD-10-CM | POA: Diagnosis not present

## 2021-01-30 NOTE — Progress Notes (Signed)
Radiation Oncology Follow up Note  Name: Kendra [name]   Date:   01/30/2021 MRN:  [id number] DOB: Sep 13, 1946    This 75 y.o. female presents to the clinic today for 2-1/2-year follow-up status post whole breast radiation to her right breast for stage IIa invasive mammary carcinoma ER/PR positive.Marland Kitchen  REFERRING PROVIDER: Virginia Crews, MD  HPI: Patient is a 75 year old female now out 2-1/2 years having completed whole breast radiation to her right breast for stage IIa invasive mammary carcinoma ER/PR positive seen today in routine follow-up she is doing well.  She specifically denies breast tenderness cough or bone pain.  She is currently on.  Letrozole tolerant well without side effect.  She had mammogram and ultrasound back in August which I have reviewed were BI-RADS 2 benign.  COMPLICATIONS OF TREATMENT: none  FOLLOW UP COMPLIANCE: keeps appointments   PHYSICAL EXAM:  BP 114/62   Pulse 94   Temp (!) 97 F (36.1 C) (Tympanic)   Wt 206 lb 3.2 oz (93.5 kg)   BMI 33.28 kg/m  Lungs are clear to A&P cardiac examination essentially unremarkable with regular rate and rhythm. No dominant mass or nodularity is noted in either breast in 2 positions examined. Incision is well-healed. No axillary or supraclavicular adenopathy is appreciated. Cosmetic result is excellent.  Patient has multiple seborrheic keratoses.  Well-developed well-nourished patient in NAD. HEENT reveals PERLA, EOMI, discs not visualized.  Oral cavity is clear. No oral mucosal lesions are identified. Neck is clear without evidence of cervical or supraclavicular adenopathy. Lungs are clear to A&P. Cardiac examination is essentially unremarkable with regular rate and rhythm without murmur rub or thrill. Abdomen is benign with no organomegaly or masses noted. Motor sensory and DTR levels are equal and symmetric in the upper and lower extremities. Cranial nerves II through XII are grossly intact. Proprioception is intact. No  peripheral adenopathy or edema is identified. No motor or sensory levels are noted. Crude visual fields are within normal range.  RADIOLOGY RESULTS: Mammogram and ultrasound reviewed compatible with above-stated findings  PLAN: Present time patient is doing well with no evidence of disease 2 and half years out.  Of asked to see her back in 1 year for follow-up.  She continues on letrozole without side effect.  Patient knows to call with any concerns.  I would like to take this opportunity to thank you for allowing me to participate in the care of your patient.Noreene Filbert, MD

## 2021-02-03 ENCOUNTER — Ambulatory Visit (INDEPENDENT_AMBULATORY_CARE_PROVIDER_SITE_OTHER): Payer: Medicare PPO | Admitting: Licensed Clinical Social Worker

## 2021-02-03 ENCOUNTER — Other Ambulatory Visit: Payer: Self-pay

## 2021-02-03 DIAGNOSIS — F411 Generalized anxiety disorder: Secondary | ICD-10-CM

## 2021-02-03 DIAGNOSIS — F3176 Bipolar disorder, in full remission, most recent episode depressed: Secondary | ICD-10-CM | POA: Diagnosis not present

## 2021-02-03 NOTE — Progress Notes (Signed)
Virtual Visit via Video Note  I connected with Kendra [name] on 02/03/21 at  2:00 PM EDT by a video enabled telemedicine application and verified that I am speaking with the correct person using two identifiers.  Location: Patient: home Provider: remote office [place], [place])   I discussed the limitations of evaluation and management by telemedicine and the availability of in person appointments. The patient expressed understanding and agreed to proceed.   I discussed the assessment and treatment plan with the patient. The patient was provided an opportunity to ask questions and all were answered. The patient agreed with the plan and demonstrated an understanding of the instructions.   The patient was advised to call back or seek an in-person evaluation if the symptoms worsen or if the condition fails to improve as anticipated.  I provided 36 minutes of non-face-to-face time during this encounter.   Kendra Picotte R Moe Brier, LCSW    THERAPIST PROGRESS NOTE  Session Time: 2-2:36  Participation Level: Active  Behavioral Response: NAAlertAnxious  Type of Therapy: Individual Therapy  Treatment Goals addressed: Anxiety and Coping  Interventions: CBT and Supportive  Summary: Kendra [name] is a 75 y.o. female who presents with improving symptoms related to bipolar disorder and anxiety diagnosis. Pt reports that overall mood has been stable and that she is managing stress and anxiety well. Pt reporting good quality and quantity of sleep, which is an improvement over past sessions.   Allowed pt to explore recent stressors and pt expressed concerns about recent cologuard test coming back as positive and needing a referral to GI for possible colonoscopy. Allowed pt to explore feelings connected to this--pt states that she feels very uncertain about outcomes and that makes her feel very unsettled. Pt has husband for support and feels well supported. Pt feels that she will feel better after  having the test done so that she will not constantly be anxious about it.   Discussed parkinson's symptoms, balance issues, and pts continuing physical therapy. Pt reports that she has not had a fall since last session. Pt reports that she is still trying to sit outside, play soduko puzzles, and paint still life paintings for fun/cognitive stimulation. Encouraged pt to continue with these activities.   Continued recommendations are as follows: self care behaviors, positive social engagements, focusing on overall work/home/life balance, and focusing on positive physical and emotional wellness.   Suicidal/Homicidal: No  Therapist Response: Kendra [name] is continuing to identify anxiety triggers and utilize strategies that have been helpful in the past to manage symptoms. Kendra [name] is continuing to develop healthy cognitive patterns and is participating in regular social activities/conversations that reflect increasing energy, interest, and is a tool to help overcome depression symptoms. Pts choices reflect personal growth and progress. Treatment to continue as indicated.   Plan: Return again in 4 weeks.  Diagnosis: Axis I: Bipolar, mixed and Generalized Anxiety Disorder    Axis II: No diagnosis    [place], LCSW 02/03/2021

## 2021-02-12 ENCOUNTER — Ambulatory Visit: Payer: Medicare PPO | Attending: Family Medicine | Admitting: Physical Therapy

## 2021-02-12 ENCOUNTER — Encounter: Payer: Self-pay | Admitting: Physical Therapy

## 2021-02-12 ENCOUNTER — Other Ambulatory Visit: Payer: Self-pay

## 2021-02-12 DIAGNOSIS — R262 Difficulty in walking, not elsewhere classified: Secondary | ICD-10-CM | POA: Insufficient documentation

## 2021-02-12 DIAGNOSIS — M6281 Muscle weakness (generalized): Secondary | ICD-10-CM | POA: Insufficient documentation

## 2021-02-12 NOTE — Therapy (Addendum)
[facility] [address], [place], [address] Phone: [phone]   Fax:  [phone]  Physical Therapy Treatment  Patient Details  Name: Kendra [name] MRN: [id number] Date of Birth: [birth year] No data recorded  Encounter Date: 02/12/2021    Past Medical History:  Diagnosis Date  . Anxiety   . Breast cancer ([place]) 03/2018   right breast cancer at 11:00 and 1:00  . Bursitis    bilateral hips and knees  . Complication of anesthesia   . Depression   . Family history of breast cancer 03/24/2018  . Family history of lung cancer 03/24/2018  . History of alcohol dependence ([place]) 2007   no ETOH; resolved since 2007  . History of hiatal hernia   . History of psychosis 2014   due to sleep disturbance  . Hypertension   . Parkinson disease ([place])   . Personal history of radiation therapy 06/2018-07/2018   right breast ca  . PONV (postoperative nausea and vomiting)     Past Surgical History:  Procedure Laterality Date  . BREAST BIOPSY Right 03/14/2018   11:00 DCIS and invasive ductal carcinoma  . BREAST BIOPSY Right 03/14/2018   1:00 Invasive ductal carcinoma  . BREAST LUMPECTOMY Right 04/27/2018   lumpectomy of 11 and 1:00 cancers, clear margins, negative LN  . BREAST LUMPECTOMY WITH RADIOACTIVE SEED AND SENTINEL LYMPH NODE BIOPSY Right 04/27/2018   Procedure: RIGHT BREAST LUMPECTOMY WITH RADIOACTIVE SEED X 2 AND RIGHT SENTINEL LYMPH NODE BIOPSY;  Surgeon: [name], MD;  Location: [facility];  Service: General;  Laterality: Right;  . CATARACT EXTRACTION W/PHACO Left 08/30/2019   Procedure: CATARACT EXTRACTION PHACO AND INTRAOCULAR LENS PLACEMENT ([place]) LEFT panoptix toric  01:20.5  12.7%  10.26;  Surgeon: Leandrew Koyanagi, MD;  Location: [facility];  Service: Ophthalmology;  Laterality: Left;  . CATARACT EXTRACTION W/PHACO Right 09/20/2019   Procedure: CATARACT EXTRACTION PHACO  AND INTRAOCULAR LENS PLACEMENT ([facility]) RIGHT 5.71  00:36.4  15.7%;  Surgeon: Leandrew Koyanagi, MD;  Location: [facility];  Service: Ophthalmology;  Laterality: Right;  . LAPAROTOMY N/A 02/10/2018   Procedure: EXPLORATORY LAPAROTOMY;  Surgeon: Isabel Caprice, MD;  Location[facility];  Service: Gynecology;  Laterality: N/A;  . MASS EXCISION  01/2018   abdominal  . OVARIAN CYST REMOVAL    . SALPINGOOPHORECTOMY Bilateral 02/10/2018   Procedure: BILATERAL SALPINGO OOPHORECTOMY; PERITONEAL WASHINGS;  Surgeon: Isabel Caprice, MD;  [facility];  Service: Gynecology;  Laterality: Bilateral;  . TONSILLECTOMY    . TONSILLECTOMY AND ADENOIDECTOMY  1953    There were no vitals filed for this visit.       TREATMENT Neuromuscular rehab Nustep 66mins with speed changes for increased endurance and tolerance to speed change STS with fast stand, slow sit 3x 6 with cuing for difference between speeds with good carry over Walking with speed changes over 242ft, changes called out at random, CGA for safety Walking with speed changes over 312ft, vertical and horizontal head turns  Soccer kicks -x 10 each LE without UE support with cuing for BIG wind up, CGA for safety  Forward walking with soccer ball kicks 2x 74ft                            PT Short Term Goals - 01/14/21 1029      PT SHORT TERM GOAL #1   Title Patient will demonstrate  complaince with HEP to speed recovery and reduce total number of visits.    Baseline HEP given; 01/14/2021: Independent with HEP    Time 2    Period Weeks    Status Achieved    Target Date 09/27/19             PT Long Term Goals - 01/13/21 1328      PT LONG TERM GOAL #1   Title Patient will improve hip strength to 4/5 grossly to facilitate improved mobility with ADLs.    Baseline 3, 4-; 12/22 deferred to NV; 11/09/2019: 4-/5; 12/07/2019 L hip flexor 4, abd and ext 3+; 01/29/2020: 4/5 for most hip motions, 3+/5 for abd, ext;  03/20/2020: 4/5 for hip movements    Time 8    Period Weeks    Status Achieved      PT LONG TERM GOAL #2   Title Patient will demonstrate walking tolerance increased to 30 minutes without increase in symptoms to demonstrate improved ability to perform walking program for cardiovascular health.    Baseline 10 minute tolerance (patient report); 12/22 10 minutes (per pt report); 11/09/2019: 7 min; 12/07/2019 unable to assess due to icy streets;  01/29/2020: 10 min; 03/20/2020 10 min; 05/14/2020: 10 min x 3 per day; 06/11/2020: x 3 per day; 07/15/2020: x 3 per day; 09/02/2020: x 3 (just standing rest breaks in between); 11/06/2019: 10 min x 3    Time 8    Period Weeks    Status Partially Met      PT LONG TERM GOAL #3   Title Patient will report worst pain of 3/10 during therapy to demonstrate reduced disability    Baseline Worst 7/10; 12/22 worst 9/10; 11/09/2019: 9/10; 12/07/2019 7/10; 01/29/2020: 7/10; 05/14/2020: 0/ 10    Time 8    Period Weeks    Status Achieved      PT LONG TERM GOAL #4   Title Patient will demonstrate ability to perform all bed mobility maneuvers without increase in pain for reduced disability with mobility and transfers with ADLs.    Baseline Reports pain with bed mobility supine <> sidelying L/R, sidelying to sitting EOB; 12/22 pt able to perform I without increased pain    Time 8    Period Weeks    Status Achieved      PT LONG TERM GOAL #5   Title Patient will improve speed to 1.0 m/sec to meet norms for minimal fall risk and full community ambulation.    Baseline 0.88 m/sec; 12/22 0.96 m/sec; 11/09/2019: deferred; 12/07/2019 0.90 m/sec; 01/29/2020: .9  m/sec; 03/20/2020: .9m/s; 05/14/2020: . 9 m/s; 06/11/2020: .55m/s; 07/15/2020: 1 m/s    Time 8    Period Weeks    Status Achieved      Additional Long Term Goals   Additional Long Term Goals Yes      PT LONG TERM GOAL #6   Title Patient will increase by 200 ft to demonstrate improvement in functional capacity  for transition to cardiac walking program    Baseline = 1195; 1/7/20201: deferred; 12/07/2019 1095; 01/29/2020: deferred secondary to fatigue; 03/20/2020: 1068ft; 07/15/2020: 1175ft; 09/03/2020: 850ft; 11/05/2020: 95ft; 01/13/2021: 1058ft    Time 8    Period Weeks    Status On-going      PT LONG TERM GOAL #7   Title Patient will improve her 5xsts to under 15 sec to indicate singificnat improvement in LE functional strength and decrease in fall risk.  Baseline 24.5 sec; 03/20/2020: 19.19 sec; 06/13/2020: 14. 92    Time 6    Period Weeks    Status Achieved      PT LONG TERM GOAL #8   Title Patient will improve FGA to 28/30 to indicate improvement in dynamic balance and improvement in stability.    Baseline 23/30; 06/11/2020: 25/30; 07/15/2020: 26/30; 09/02/2020: 27/30; 11/05/2020: 28/30    Time 6    Period Weeks    Status Achieved      PT LONG TERM GOAL  #9   TITLE Patient will be able to ascend/desend the stairs without use of UEs to increase safety and decrease fall risk with stair use.    Baseline requires use of UEs; 01/13/2021: able to perform with CGA    Time 6    Period Weeks    Status Achieved      PT LONG TERM GOAL  #10   TITLE Patient will improve her TUG scores to under 12 sec for normal tug; 15 sec for cognitive TUG to decrease fall risk overall    Baseline normal: 17.2; 27.3 sec    Time 6    Period Weeks    Status New                  Patient will benefit from skilled therapeutic intervention in order to improve the following deficits and impairments:  Decreased range of motion,Postural dysfunction,Pain,Abnormal gait,Decreased activity tolerance,Decreased coordination,Decreased endurance,Decreased balance,Decreased mobility,Difficulty walking,Hypomobility,Increased muscle spasms,Impaired perceived functional ability,Decreased strength,Impaired flexibility  Visit Diagnosis: Difficulty in walking, not elsewhere classified     Problem List Patient Active Problem  List   Diagnosis Date Noted  . Osteopenia of neck of right femur 09/06/2020  . Class 1 obesity with serious comorbidity and body mass index (BMI) of 32.0 to 32.9 in adult 08/29/2020  . Candidal skin infection 04/29/2020  . Parkinson disease ([place]) 02/22/2020  . 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 Bipolar disorder, in full remission, most recent episode depressed ([place]) 11/08/2019  . Avitaminosis D 08/24/2019  . Insomnia due to mental condition 05/31/2019  . Bipolar I disorder, most recent episode depressed ([place]) 05/31/2019  . Cognitive disorder 05/31/2019  . Alcohol use disorder, moderate, in sustained remission ([place]) 05/31/2019  . Elevated tumor markers 05/27/2019  . GAD (generalized anxiety disorder) 04/10/2019  . MDD (major depressive disorder), severe ([place]) 01/31/2019  . High serum carbohydrate antigen 19-9 (CA19-9) 12/01/2018  . Adjustment disorder with anxious mood 11/24/2018  . Essential hypertension 11/24/2018  . B12 deficiency 10/17/2018  . Incisional hernia, without obstruction or gangrene 09/12/2018  . Genetic testing 04/07/2018  . Malignant neoplasm of upper-outer quadrant of right breast in female, estrogen receptor positive ([place]) 03/17/2018  . Malignant neoplasm of upper-inner quadrant of right breast in female, estrogen receptor positive ([place]) 03/17/2018  . History of psychosis 02/07/2018  . History of insomnia 02/07/2018   Durwin Reges DPT Durwin Reges 02/17/2021, 9:01 AM  [facility] [address], [place], [address] Phone: [phone]   Fax:  [phone]  Name: Kendra [name] MRN: [id number] Date of Birth: June 06, 1946

## 2021-02-17 ENCOUNTER — Ambulatory Visit: Payer: Medicare PPO | Admitting: Physical Therapy

## 2021-02-17 ENCOUNTER — Encounter: Payer: Self-pay | Admitting: Physical Therapy

## 2021-02-17 ENCOUNTER — Other Ambulatory Visit: Payer: Self-pay

## 2021-02-17 DIAGNOSIS — M6281 Muscle weakness (generalized): Secondary | ICD-10-CM | POA: Diagnosis not present

## 2021-02-17 DIAGNOSIS — R262 Difficulty in walking, not elsewhere classified: Secondary | ICD-10-CM

## 2021-02-17 NOTE — Addendum Note (Signed)
Addended by: Kelton Pillar on: 02/17/2021 09:02 AM   Modules accepted: Orders

## 2021-02-17 NOTE — Therapy (Signed)
[facility] [address] [place], [address] Phone: [phone]   Fax:  [phone]  Physical Therapy Treatment  Patient Details  Name: Kendra [name] MRN: [id number] Date of Birth: Jul 11, 1946 No data recorded  Encounter Date: 02/17/2021   PT End of Session - 02/17/21 0910    Visit Number 97    Number of Visits 100    Date for PT Re-Evaluation 04/02/21    PT Start Time 0900    PT Stop Time 0940    PT Time Calculation (min) 40 min    Equipment Utilized During Treatment Gait belt    Activity Tolerance Patient tolerated treatment well    Behavior During Therapy WFL for tasks assessed/performed           Past Medical History:  Diagnosis Date  . Anxiety   . Breast cancer ([facility]) 03/2018   right breast cancer at 11:00 and 1:00  . Bursitis    bilateral hips and knees  . Complication of anesthesia   . Depression   . Family history of breast cancer 03/24/2018  . Family history of lung cancer 03/24/2018  . History of alcohol dependence ([place]) 2007   no ETOH; resolved since 2007  . History of hiatal hernia   . History of psychosis 2014   due to sleep disturbance  . Hypertension   . Parkinson disease ([place])   . Personal history of radiation therapy 06/2018-07/2018   right breast ca  . PONV (postoperative nausea and vomiting)     Past Surgical History:  Procedure Laterality Date  . BREAST BIOPSY Right 03/14/2018   11:00 DCIS and invasive ductal carcinoma  . BREAST BIOPSY Right 03/14/2018   1:00 Invasive ductal carcinoma  . BREAST LUMPECTOMY Right 04/27/2018   lumpectomy of 11 and 1:00 cancers, clear margins, negative LN  . BREAST LUMPECTOMY WITH RADIOACTIVE SEED AND SENTINEL LYMPH NODE BIOPSY Right 04/27/2018   Procedure: RIGHT BREAST LUMPECTOMY WITH RADIOACTIVE SEED X 2 AND RIGHT SENTINEL LYMPH NODE BIOPSY;  Surgeon: [name], MD;  Location: [facility];  Service: General;   Laterality: Right;  . CATARACT EXTRACTION W/PHACO Left 08/30/2019   Procedure: CATARACT EXTRACTION PHACO AND INTRAOCULAR LENS PLACEMENT ([facility]) LEFT panoptix toric  01:20.5  12.7%  10.26;  Surgeon: Leandrew Koyanagi, MD;  Location: [facility];  Service: Ophthalmology;  Laterality: Left;  . CATARACT EXTRACTION W/PHACO Right 09/20/2019   Procedure: CATARACT EXTRACTION PHACO AND INTRAOCULAR LENS PLACEMENT ([facility]) RIGHT 5.71  00:36.4  15.7%;  Surgeon: Leandrew Koyanagi, MD;  Location: [facility];  Service: Ophthalmology;  Laterality: Right;  . LAPAROTOMY N/A 02/10/2018   Procedure: EXPLORATORY LAPAROTOMY;  Surgeon: Isabel Caprice, MD;  [facility];  Service: Gynecology;  Laterality: N/A;  . MASS EXCISION  01/2018   abdominal  . OVARIAN CYST REMOVAL    . SALPINGOOPHORECTOMY Bilateral 02/10/2018   Procedure: BILATERAL SALPINGO OOPHORECTOMY; PERITONEAL WASHINGS;  Surgeon: Isabel Caprice, MD;  [facility];  Service: Gynecology;  Laterality: Bilateral;  . TONSILLECTOMY    . TONSILLECTOMY AND ADENOIDECTOMY  1953    There were no vitals filed for this visit.   Subjective Assessment - 02/17/21 0907    Subjective Patient reports she did some walking this weekend, but that her back starting hurting her after about 5 mins of walking. She used to be able to walk for 2mins, which she wants to get back up to. She reports fatigue after short time  and LBP keep her from higher distances. No pain this am.    Pertinent History Patient report unclear regarding onset. Visited MD on Sept 16 and received injection with relief for approximately 1 month. Reports she cannot receive an additional injection pending eye surgery. Received dx from MD of hip bursitis in B hips. Reports pain with walking, stanindg, sitting 5/10 current 7/10 worst 3/10 best, with temporary relief when she is sitting "just right" and with naproxen. Agg: activity. Cormorbidities: hx of breast cancer, HTN, MCI, insomnia,  cataracts    Limitations Sitting;Standing;Walking;House hold activities    How long can you sit comfortably? Pain constant, reduced with neutral pelvic tilt    How long can you stand comfortably? Pain constant, increases with time    How long can you walk comfortably? Pain constant, increases with time    Patient Stated Goals Be able to move without hurting, walk 1 hour    Pain Onset More than a month ago               TREATMENT Neuromuscular rehab Nustep 6mins with 50sec "normal speed" 10sec "fast as possible speed" x5 rounds for increased endurance and tolerance to speed change Mini squat 2x 12 with max cuing initially for proper technique of therex with good eventual carry over Lateral stepping with visual focus on wall to prevent dizziness (difficulty with changing environment of cars at window) 2x 20 (10R 10L) Standing altmarching unilateral support 2x 12 Walking with horizontal head turns to identify numbers to L and R of the patient 365ft, with rest needed with pursed lip breathing (normal O2 levels, observable labored breathing, with good carry over) Education on breath control and energy conservation as it relates to PKD with good understanding Ambulation      PT reviewed the following HEP with patient with patient able to demonstrate a set of the following with min cuing for correction needed. PT educated patient on parameters of therex (how/when to inc/decrease intensity, frequency, rep/set range, stretch hold time, and purpose of therex) with verbalized understanding.   Access Code: [email or web address] Squat with Chair Touch - 1 x daily - 2-3 x weekly - 3 sets - 12 reps Sidestepping - 1 x daily - 2-3 x weekly - 3 sets - 10 reps Standing March with Unilateral Counter Support - 1 x daily - 2-3 x weekly - 3 sets - 12 reps Walking 5-58mins daily                     PT Education - 02/17/21 0910    Education provided Yes    Education Details therex form/technique     Person(s) Educated Patient    Methods Explanation;Demonstration;Verbal cues    Comprehension Verbalized understanding;Returned demonstration;Verbal cues required            PT Short Term Goals - 01/14/21 1029      PT SHORT TERM GOAL #1   Title Patient will demonstrate complaince with HEP to speed recovery and reduce total number of visits.    Baseline HEP given; 01/14/2021: Independent with HEP    Time 2    Period Weeks    Status Achieved    Target Date 09/27/19             PT Long Term Goals - 01/13/21 1328      PT LONG TERM GOAL #1   Title Patient will improve hip strength to 4/5 grossly to facilitate improved mobility with ADLs.    Baseline  3, 4-; 12/22 deferred to NV; 11/09/2019: 4-/5; 12/07/2019 L hip flexor 4, abd and ext 3+; 01/29/2020: 4/5 for most hip motions, 3+/5 for abd, ext; 03/20/2020: 4/5 for hip movements    Time 8    Period Weeks    Status Achieved      PT LONG TERM GOAL #2   Title Patient will demonstrate walking tolerance increased to 30 minutes without increase in symptoms to demonstrate improved ability to perform walking program for cardiovascular health.    Baseline 10 minute tolerance (patient report); 12/22 10 minutes (per pt report); 11/09/2019: 7 min; 12/07/2019 unable to assess due to icy streets;  01/29/2020: 10 min; 03/20/2020 10 min; 05/14/2020: 10 min x 3 per day; 06/11/2020: 43min x 3 per day; 07/15/2020: 5min x 3 per day; 09/02/2020: 70min x 3 (just standing rest breaks in between); 11/06/2019: 10 min x 3    Time 8    Period Weeks    Status Partially Met      PT LONG TERM GOAL #3   Title Patient will report worst pain of 3/10 during therapy to demonstrate reduced disability    Baseline Worst 7/10; 12/22 worst 9/10; 11/09/2019: 9/10; 12/07/2019 7/10; 01/29/2020: 7/10; 05/14/2020: 0/ 10    Time 8    Period Weeks    Status Achieved      PT LONG TERM GOAL #4   Title Patient will demonstrate ability to perform all bed mobility maneuvers without increase in pain for  reduced disability with mobility and transfers with ADLs.    Baseline Reports pain with bed mobility supine <> sidelying L/R, sidelying to sitting EOB; 12/22 pt able to perform I without increased pain    Time 8    Period Weeks    Status Achieved      PT LONG TERM GOAL #5   Title Patient will improve 10MW speed to 1.0 m/sec to meet norms for minimal fall risk and full community ambulation.    Baseline 0.88 m/sec; 12/22 0.96 m/sec; 11/09/2019: deferred; 12/07/2019 0.90 m/sec; 01/29/2020: .9  m/sec; 03/20/2020: .56m/s; 05/14/2020: . 9 m/s; 06/11/2020: .54m/s; 07/15/2020: 1 m/s    Time 8    Period Weeks    Status Achieved      Additional Long Term Goals   Additional Long Term Goals Yes      PT LONG TERM GOAL #6   Title Patient will increase 6MWT by 200 ft to demonstrate improvement in functional capacity for transition to cardiac walking program    Baseline 6MWT = 1195; 1/7/20201: deferred; 12/07/2019 1095; 01/29/2020: deferred secondary to fatigue; 03/20/2020: 1025ft; 07/15/2020: 1140ft; 09/03/2020: 870ft; 11/05/2020: 970ft; 01/13/2021: 1073ft    Time 8    Period Weeks    Status On-going      PT LONG TERM GOAL #7   Title Patient will improve her 5xsts to under 15 sec to indicate singificnat improvement in LE functional strength and decrease in fall risk.    Baseline 24.5 sec; 03/20/2020: 19.19 sec; 06/13/2020: 14. 92    Time 6    Period Weeks    Status Achieved      PT LONG TERM GOAL #8   Title Patient will improve FGA to 28/30 to indicate improvement in dynamic balance and improvement in stability.    Baseline 23/30; 06/11/2020: 25/30; 07/15/2020: 26/30; 09/02/2020: 27/30; 11/05/2020: 28/30    Time 6    Period Weeks    Status Achieved      PT LONG TERM GOAL  #9  TITLE Patient will be able to ascend/desend the stairs without use of UEs to increase safety and decrease fall risk with stair use.    Baseline requires use of UEs; 01/13/2021: able to perform with CGA    Time 6    Period Weeks    Status  Achieved      PT LONG TERM GOAL  #10   TITLE Patient will improve her TUG scores to under 12 sec for normal tug; 15 sec for cognitive TUG to decrease fall risk overall    Baseline normal: 17.2; 27.3 sec    Time 6    Period Weeks    Status New                 Plan - 02/17/21 0944    Clinical Impression Statement PT continued neuromuscular training and re education with focus on increased endurance therex with varying environmental changes. patient does report some dizziness occassionally that resolves with rest. PT continued focus on large movements for increased foot clearance and for increased muscle recruitment with success. PT educated patient on breath control as well for energy conservation with good understanding.HEP was updated to allow for carry over of therapy gains and for increased patient independence with success.  PT will continue progression as able.    Personal Factors and Comorbidities Age;Comorbidity 3+;Time since onset of injury/illness/exacerbation;Comorbidity 2;Other    Comorbidities MCI, HTN, CA, cataracts    Examination-Activity Limitations Bend;Sit;Squat;Stairs;Stand;Sleep    Examination-Participation Restrictions Cleaning;Community Activity    Stability/Clinical Decision Making Evolving/Moderate complexity    Clinical Decision Making Moderate    Rehab Potential Fair    Clinical Impairments Affecting Rehab Potential MCI    PT Frequency 2x / week    PT Duration 6 weeks    PT Treatment/Interventions ADLs/Self Care Home Management;Therapeutic exercise;Patient/family education;Neuromuscular re-education;Therapeutic activities;Functional mobility training;Balance training;Manual techniques;Gait training;Stair training;Moist Heat;Electrical Stimulation;Cryotherapy;Taping;Dry needling;Energy conservation;Passive range of motion;Joint Manipulations    PT Next Visit Plan Progress glute therex, pelvic-hip dissociation; check and progress HEP with stretches    PT Home  Exercise Plan lateral step ups, step ups, hip kickers ext/abd red theraband    Consulted and Agree with Plan of Care Patient           Patient will benefit from skilled therapeutic intervention in order to improve the following deficits and impairments:  Decreased range of motion,Postural dysfunction,Pain,Abnormal gait,Decreased activity tolerance,Decreased coordination,Decreased endurance,Decreased balance,Decreased mobility,Difficulty walking,Hypomobility,Increased muscle spasms,Impaired perceived functional ability,Decreased strength,Impaired flexibility  Visit Diagnosis: Difficulty in walking, not elsewhere classified  Muscle weakness (generalized)     Problem List Patient Active Problem List   Diagnosis Date Noted  . Osteopenia of neck of right femur 09/06/2020  . Class 1 obesity with serious comorbidity and body mass index (BMI) of 32.0 to 32.9 in adult 08/29/2020  . Candidal skin infection 04/29/2020  . Parkinson disease ([place]) 02/22/2020  . 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 Bipolar disorder, in full remission, most recent episode depressed ([place]) 11/08/2019  . Avitaminosis D 08/24/2019  . Insomnia due to mental condition 05/31/2019  . Bipolar I disorder, most recent episode depressed ([place]) 05/31/2019  . Cognitive disorder 05/31/2019  . Alcohol use disorder, moderate, in sustained remission ([place]) 05/31/2019  . Elevated tumor markers 05/27/2019  . GAD (generalized anxiety disorder) 04/10/2019  . MDD (major depressive disorder), severe ([place]) 01/31/2019  . High serum carbohydrate antigen 19-9 (CA19-9) 12/01/2018  . Adjustment disorder with anxious mood 11/24/2018  . Essential hypertension 11/24/2018  . B12 deficiency 10/17/2018  .  Incisional hernia, without obstruction or gangrene 09/12/2018  . Genetic testing 04/07/2018  . Malignant neoplasm of upper-outer quadrant of right breast in female, estrogen receptor positive ([place]) 03/17/2018  . Malignant neoplasm of upper-inner  quadrant of right breast in female, estrogen receptor positive ([place]) 03/17/2018  . History of psychosis 02/07/2018  . History of insomnia 02/07/2018   Durwin Reges DPT Durwin Reges 02/17/2021, 11:22 AM  [facility] [address] [place], [address] Phone: [phone]   Fax:  [phone]  Name: Kendra [name] MRN: [id number] Date of Birth: Oct 29, 1946

## 2021-02-18 ENCOUNTER — Encounter: Payer: Medicare PPO | Admitting: Physical Therapy

## 2021-02-18 ENCOUNTER — Other Ambulatory Visit: Payer: Self-pay | Admitting: Hematology and Oncology

## 2021-02-18 DIAGNOSIS — M85851 Other specified disorders of bone density and structure, right thigh: Secondary | ICD-10-CM

## 2021-02-20 NOTE — Progress Notes (Addendum)
[facility]  [address] [place],  [address] Phone: [phone]  Fax: [phone]   Clinic Day:  02/24/21  Referring physician: Virginia Crews, MD  Chief Complaint: Kendra [name] is a 75 y.o. female with multifocal stage IA right breast cancerand B12 deficiency who is seen for 6 month assessment.   HPI: The patient was last seen in the medical oncology clinic von 08/26/2020. At that time, she felt "ok". She continued to use MiraLax for constipation. She performed self breast exams monthly. She was using Nystatin powder for right breast moisture. She relied on her husband for help. Exam was stable. Hematocrit was 39.6, hemoglobin 12.9, platelets 217,000, WBC 4,100. Sodium was 133. CA27.29 was 21.0. CA19-9 was 33. CA125 was 8.2. She continued Femara and at-home vitamin B12 injections. She was to begin calcium.   Cologuard was positive on 11/06/2020.  The patient saw Dr. Baruch Gouty on 01/30/2021. She had been doing well. She was tolerating Femara without side effects. Follow-up was planned for 1 year.  During the interim, she continues to tolerate Femara.  Reports weight gain and swelling in bilateral lower extremities.  Has tenderness to right scar line.  Diagnosed with Parkinson's about 2 years ago and is having weakness.  Followed by neurology.  Denies any pain.   Past Medical History:  Diagnosis Date  . Anxiety   . Breast cancer ([place]) 03/2018   right breast cancer at 11:00 and 1:00  . Bursitis    bilateral hips and knees  . Complication of anesthesia   . Depression   . Family history of breast cancer 03/24/2018  . Family history of lung cancer 03/24/2018  . History of alcohol dependence ([place]) 2007   no ETOH; resolved since 2007  . History of hiatal hernia   . History of psychosis 2014   due to sleep disturbance  . Hypertension   . Parkinson disease ([place])   . Personal history of radiation therapy 06/2018-07/2018   right breast ca   . PONV (postoperative nausea and vomiting)     Past Surgical History:  Procedure Laterality Date  . BREAST BIOPSY Right 03/14/2018   11:00 DCIS and invasive ductal carcinoma  . BREAST BIOPSY Right 03/14/2018   1:00 Invasive ductal carcinoma  . BREAST LUMPECTOMY Right 04/27/2018   lumpectomy of 11 and 1:00 cancers, clear margins, negative LN  . BREAST LUMPECTOMY WITH RADIOACTIVE SEED AND SENTINEL LYMPH NODE BIOPSY Right 04/27/2018   Procedure: RIGHT BREAST LUMPECTOMY WITH RADIOACTIVE SEED X 2 AND RIGHT SENTINEL LYMPH NODE BIOPSY;  Surgeon: [name], MD;  Location: [place];  Service: General;  Laterality: Right;  . CATARACT EXTRACTION W/PHACO Left 08/30/2019   Procedure: CATARACT EXTRACTION PHACO AND INTRAOCULAR LENS PLACEMENT ([place]) LEFT panoptix toric  01:20.5  12.7%  10.26;  Surgeon: Leandrew Koyanagi, MD;  Location: [place];  Service: Ophthalmology;  Laterality: Left;  . CATARACT EXTRACTION W/PHACO Right 09/20/2019   Procedure: CATARACT EXTRACTION PHACO AND INTRAOCULAR LENS PLACEMENT ([facility]) RIGHT 5.71  00:36.4  15.7%;  Surgeon: Leandrew Koyanagi, MD;  Location: [place];  Service: Ophthalmology;  Laterality: Right;  . LAPAROTOMY N/A 02/10/2018   Procedure: EXPLORATORY LAPAROTOMY;  Surgeon: Isabel Caprice, MD;  Location: [facility];  Service: Gynecology;  Laterality: N/A;  . MASS EXCISION  01/2018   abdominal  . OVARIAN CYST REMOVAL    . SALPINGOOPHORECTOMY Bilateral 02/10/2018   Procedure: BILATERAL SALPINGO OOPHORECTOMY; PERITONEAL WASHINGS;  Surgeon: Isabel Caprice, MD;  Location: [facility];  Service: Gynecology;  Laterality: Bilateral;  . TONSILLECTOMY    . TONSILLECTOMY AND ADENOIDECTOMY  1952    Family History  Problem Relation Age of Onset  . Diabetes Mother   . Breast cancer Mother 60  . Hypertension Mother   . Lung cancer Father        asbestos exposure  . Heart disease Brother 2  . Breast cancer Cousin 40        paternal cousin  . Heart disease Paternal Grandmother 68  . Heart disease Paternal Grandfather     Social History:  reports that she has never smoked. She has never used smokeless tobacco. She reports previous alcohol use. She reports that she does not use drugs. She is married to her husband [name]. They live in [place]. The patient is accompanied by [name] today.  Allergies:  Allergies  Allergen Reactions  . Hydroxyzine Anaphylaxis    Tongue swollen  . Other Other (See Comments)    Food poisoning   . Shrimp [Shellfish Allergy] Other (See Comments)    Food poisoning     Current Medications: Current Outpatient Medications  Medication Sig Dispense Refill  . Acetaminophen 500 MG capsule Take 1,000 mg by mouth every 6 (six) hours as needed.     . carbidopa-levodopa (SINEMET IR) 25-100 MG tablet Take 1 tablet by mouth at bedtime.     . cyanocobalamin (,VITAMIN B-12,) 1000 MCG/ML injection Inject 1 mL (1,000 mcg total) into the muscle every 30 (thirty) days. 3 mL 3  . FLUoxetine HCl 60 MG TABS Take 60 mg by mouth daily. 90 tablet 1  . letrozole (FEMARA) 2.5 MG tablet Take 1 tablet (2.5 mg total) by mouth daily. 90 tablet 3  . lisinopril-hydrochlorothiazide (ZESTORETIC) 20-12.5 MG tablet Take 1 tablet by mouth daily. 90 tablet 3  . naproxen sodium (ALEVE) 220 MG tablet Take 220 mg by mouth 2 (two) times daily as needed.     Marland Kitchen NYAMYC powder APPLY TOPICALLY THREE TIMES A DAY 15 g 1  . polyethylene glycol (MIRALAX / GLYCOLAX) 17 g packet Take 17 g by mouth daily.    . risperiDONE (RISPERDAL) 2 MG tablet Take 0.5 tablets (1 mg total) by mouth 2 (two) times daily. 90 tablet 3  . traZODone (DESYREL) 100 MG tablet Take 1 tablet (100 mg total) by mouth at bedtime. 90 tablet 0   Current Facility-Administered Medications  Medication Dose Route Frequency Provider Last Rate Last Admin  . lidocaine (PF) (XYLOCAINE) 1 % injection 4 mL  4 mL Intradermal Once Virginia Crews, MD      .  lidocaine (PF) (XYLOCAINE) 1 % injection 4 mL  4 mL Intradermal Once Brita Romp Dionne Bucy, MD         Review of Systems  Constitutional: Positive for malaise/fatigue. Negative for chills, fever and weight loss.  HENT: Negative for congestion, ear pain and tinnitus.   Eyes: Negative.  Negative for blurred vision and double vision.  Respiratory: Negative.  Negative for cough, sputum production and shortness of breath.   Cardiovascular: Positive for leg swelling. Negative for chest pain and palpitations.  Gastrointestinal: Negative.  Negative for abdominal pain, constipation, diarrhea, nausea and vomiting.  Genitourinary: Negative for dysuria, frequency and urgency.  Musculoskeletal: Negative for back pain and falls.  Skin: Negative.  Negative for rash.  Neurological: Negative.  Negative for weakness and headaches.  Endo/Heme/Allergies: Negative.  Does not bruise/bleed easily.  Psychiatric/Behavioral: Negative.  Negative for depression. The patient is  not nervous/anxious and does not have insomnia.     Performance status (ECOG): 1  Physical Exam Constitutional:      Appearance: She is well-developed.  HENT:     Head: Normocephalic and atraumatic.  Eyes:     Pupils: Pupils are equal, round, and reactive to light.  Cardiovascular:     Rate and Rhythm: Normal rate and regular rhythm.     Heart sounds: No murmur heard.   Pulmonary:     Effort: Pulmonary effort is normal.     Breath sounds: Normal breath sounds. No wheezing.  Abdominal:     General: Bowel sounds are normal. There is no distension.     Palpations: Abdomen is soft. There is no mass.     Tenderness: There is no abdominal tenderness.  Musculoskeletal:        General: Normal range of motion.     Cervical back: Normal range of motion.  Skin:    General: Skin is warm and dry.  Neurological:     Mental Status: She is alert and oriented to person, place, and time.  Psychiatric:        Behavior: Behavior normal.      No visits with results within 3 Day(s) from this visit.  Latest known visit with results is:  Scanned Document on 11/06/2020  Component Date Value Ref Range Status  . Cologuard 11/06/2020 Positive* Negative Final    Assessment:  Rebecah Dangerfield is a 75 y.o. female with multi-focal stage IA right breast cancers/p right lumpectomy on 04/27/2018. Pathology of the right lateral lesionrevealed a 2.5 cm grade II invasive ductal carcinoma with intermediate grade DCIS. The medial lesionrevealed a 1.1 cm grade II invasive ductal carcinoma with intermediate grade DCIS. LVIDS was present. Margins were negative. Zero of 2 lymph nodes were positive. Both tumors were ER + (100%), PR + (70%), and HER-2 negative. Ki-67 was 10%. Pathologic stage was mT2N0.  Oncotype DX revealed a recurrence score of 12 which translated to a distant recurrence of 3% at 9 years with an AI or tamxifen alone. The absolute benefit of chemotherpay was <1%.  Invitae testingon 03/23/2018 revealed a variant of uncertain significance in MSH6.   She received breast radiationfrom 06/30/2018 - 07/20/2018. She began [facility] 09/06/2018. She is tolerating it well.  Bilateral diagnostic mammogram on 06/12/2019 showed no evidence of malignancy in either breast.  Bilateral mammogram and left axilla breast ultrasound on 06/12/2020 noted expected surgical changes at the lumpectomy site in the superior right breast.  A benign cyst was seen in the lateral aspect of the left breast. There was no evidence of malignancy.  CA27.29has been followed: 25.4 on 03/01/2019, 23.5 on 06/01/2019, 19.0 on 08/28/2019, 26.5 on 12/22/2019, 22.3 on 04/29/2020, and 21.0 on 08/26/2020.  She underwent exploratory laparotomy, bilateral salpingo-oophrectomy, and pelvic washings on 02/10/2018. 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 Peritoneal washings revealed atypical cells.   She has had elevated tumor  markers.CA125has been followed (0-38.1):48.2on 01/26/2018, 11.3 on 10/24/2018, 7.9 on 04/29/2020, and 8.2 on 08/26/2020.CA19-9has been followed (0-35):322 on 01/26/2018,48 on 10/24/2018, 45 on 03/01/2019, 50 on 12/22/2019, 38 on 04/29/2020, and 33 on 08/26/2020.  Abdomen and pelvic CTon 02/04/2018 revealed a large (28.5 x 17 x 25 cm) cystic mass with enhancing septations and nodularity involving the majority of the abdomen. Abdomen and pelvic CTon 10/14/2018 revealed no acute findings. There was interval surgical resection of large cystic pelvic mass. There was no evidence of residual or metastatic neoplasm. There was suspected  hepatic cirrhosis. There were three midline ventral abdominal wall hernias (small epigastric ventral hernia, moderate paraumbilical hernia, and small suprapubic ventral hernia).   She has a history ofelevated LFTs. AST was 50 on 10/06/2018. Hepatitis B and C serologies were negative.  Bone densityon 06/28/2018 was normal with a T score of -0.8 in the right femoral neck.  Bone density on 06/12/2020 showed osteopenia with a T-score of -1.4 in the right femoral neck .   She has B12 deficiency. B12 was 232 on 08/09/2018 and 386 on 10/24/2018. She began B12 injectionson 08/15/2018.She receives B12 monthly at home.Anti-parietal antibody was elevated (23; 0-20) and intrinsic factor was normal on 10/24/2018.  Folate is 7.2 on 04/29/2020  She was diagnosed with Parkinson's and has been on Sinemet TID for about 2 months.  She has been more interactive, aware of the world, and has had more initiative.   She received her COVID-19 vaccines next year.    Symptomatically, she is feeling well.  Appears to be tolerating Femara.  Has some tenderness to scar to right breast.  Weight is stable.  Has weakness secondary to Parkinson's.  Stable on Sinemet.  Plan: 1.   Lab today: CBC with diff, CMP, CA 27-29, CA 19-9, CA-125  2.Multifocal stage IA RIGHT breast  cancer Clinically, she continues to do well. Exam reveals no evidence of recurrent disease.  CA27.29 is pending during dictation. Bilateral diagnostic mammogram on 06/12/2020 revealed no evidence of malignancy. Patient to continue Femara. 3. B12 deficiency Patient's cousin gives her B12 injections monthly.  She has pernicious anemia. Folate 7.2 on 04/29/2020.  Check folate annually.  Continue B12 injections monthly.  Will recheck B12 levels at next visit. 4. Multilocular mucinous cystadenoma Benign pathology. Some atypical cells noted in the washings. Abdomen and pelvic CT scanswere negative on 10/14/2018. She has a history of increased markers(CA19-9 and CA125).   CA19-9 is 33 (normal). Plan to pursue follow-up imaging if symptomatic or rising CA 19-9.    Continue to monitor.  Labs pending during dictation. 5.   Candidal skin infection  Nystatin powder as needed. 6.   Osteopenia  Review bone density from 06/12/2020.  Patient to take calcium 600 mg p.o. twice daily. Taking Caltrate.  Received dental clearance for Prolia.  Proceed with Prolia today.  Repeat bone density in August 2023. 7.   Parkinson's  Followed by neurology at [facility] Dr. Rise Paganini on Sinemet. 8.  Screening mammogram in August 2023 9.  RTC after mammogram to discuss results with lab work (CBC with diff, CMP, CA 27-29, CA 19-9, CA-125)   I discussed the assessment and treatment plan with the patient.  The patient was provided an opportunity to ask questions and all were answered.  The patient agreed with the plan and demonstrated an understanding of the instructions.  The patient was advised to call back or seek an in person evaluation if the symptoms worsen or if the condition fails to improve as anticipated.  Greater than 50% was spent in counseling and  coordination of care with this patient including but not limited to discussion of the relevant topics above (See A&P) including, but not limited to diagnosis and management of acute and chronic medical conditions.   Faythe Casa, NP 02/24/2021 8:56 PM

## 2021-02-24 ENCOUNTER — Encounter: Payer: Self-pay | Admitting: Physical Therapy

## 2021-02-24 ENCOUNTER — Other Ambulatory Visit: Payer: Self-pay

## 2021-02-24 ENCOUNTER — Ambulatory Visit: Payer: Medicare PPO

## 2021-02-24 ENCOUNTER — Inpatient Hospital Stay: Payer: Medicare PPO | Admitting: Oncology

## 2021-02-24 ENCOUNTER — Inpatient Hospital Stay: Payer: Medicare PPO | Attending: Oncology

## 2021-02-24 ENCOUNTER — Ambulatory Visit: Payer: Medicare PPO | Admitting: Physical Therapy

## 2021-02-24 ENCOUNTER — Encounter: Payer: Self-pay | Admitting: Oncology

## 2021-02-24 VITALS — BP 119/67 | HR 84 | Temp 98.1°F | Resp 18 | Wt 209.0 lb

## 2021-02-24 DIAGNOSIS — K439 Ventral hernia without obstruction or gangrene: Secondary | ICD-10-CM | POA: Diagnosis not present

## 2021-02-24 DIAGNOSIS — M85851 Other specified disorders of bone density and structure, right thigh: Secondary | ICD-10-CM

## 2021-02-24 DIAGNOSIS — D510 Vitamin B12 deficiency anemia due to intrinsic factor deficiency: Secondary | ICD-10-CM | POA: Insufficient documentation

## 2021-02-24 DIAGNOSIS — R978 Other abnormal tumor markers: Secondary | ICD-10-CM | POA: Insufficient documentation

## 2021-02-24 DIAGNOSIS — Z79899 Other long term (current) drug therapy: Secondary | ICD-10-CM | POA: Insufficient documentation

## 2021-02-24 DIAGNOSIS — C50411 Malignant neoplasm of upper-outer quadrant of right female breast: Secondary | ICD-10-CM

## 2021-02-24 DIAGNOSIS — G20 Parkinson's disease: Secondary | ICD-10-CM | POA: Insufficient documentation

## 2021-02-24 DIAGNOSIS — Z833 Family history of diabetes mellitus: Secondary | ICD-10-CM | POA: Insufficient documentation

## 2021-02-24 DIAGNOSIS — B372 Candidiasis of skin and nail: Secondary | ICD-10-CM | POA: Diagnosis not present

## 2021-02-24 DIAGNOSIS — Z8249 Family history of ischemic heart disease and other diseases of the circulatory system: Secondary | ICD-10-CM | POA: Insufficient documentation

## 2021-02-24 DIAGNOSIS — R262 Difficulty in walking, not elsewhere classified: Secondary | ICD-10-CM

## 2021-02-24 DIAGNOSIS — Z170 Estrogen receptor positive status [ER+]: Secondary | ICD-10-CM | POA: Insufficient documentation

## 2021-02-24 DIAGNOSIS — R531 Weakness: Secondary | ICD-10-CM | POA: Diagnosis not present

## 2021-02-24 DIAGNOSIS — D271 Benign neoplasm of left ovary: Secondary | ICD-10-CM | POA: Diagnosis not present

## 2021-02-24 DIAGNOSIS — R970 Elevated carcinoembryonic antigen [CEA]: Secondary | ICD-10-CM | POA: Insufficient documentation

## 2021-02-24 DIAGNOSIS — R1900 Intra-abdominal and pelvic swelling, mass and lump, unspecified site: Secondary | ICD-10-CM | POA: Diagnosis not present

## 2021-02-24 DIAGNOSIS — Z801 Family history of malignant neoplasm of trachea, bronchus and lung: Secondary | ICD-10-CM | POA: Insufficient documentation

## 2021-02-24 DIAGNOSIS — Z803 Family history of malignant neoplasm of breast: Secondary | ICD-10-CM | POA: Diagnosis not present

## 2021-02-24 DIAGNOSIS — R971 Elevated cancer antigen 125 [CA 125]: Secondary | ICD-10-CM | POA: Insufficient documentation

## 2021-02-24 DIAGNOSIS — Z90722 Acquired absence of ovaries, bilateral: Secondary | ICD-10-CM | POA: Insufficient documentation

## 2021-02-24 DIAGNOSIS — Z79811 Long term (current) use of aromatase inhibitors: Secondary | ICD-10-CM | POA: Diagnosis not present

## 2021-02-24 DIAGNOSIS — K7460 Unspecified cirrhosis of liver: Secondary | ICD-10-CM | POA: Diagnosis not present

## 2021-02-24 DIAGNOSIS — M7989 Other specified soft tissue disorders: Secondary | ICD-10-CM | POA: Insufficient documentation

## 2021-02-24 DIAGNOSIS — M6281 Muscle weakness (generalized): Secondary | ICD-10-CM | POA: Diagnosis not present

## 2021-02-24 LAB — CBC WITH DIFFERENTIAL/PLATELET
Abs Immature Granulocytes: 0.03 10*3/uL (ref 0.00–0.07)
Basophils Absolute: 0.1 10*3/uL (ref 0.0–0.1)
Basophils Relative: 2 %
Eosinophils Absolute: 0.2 10*3/uL (ref 0.0–0.5)
Eosinophils Relative: 5 %
HCT: 39.6 % (ref 36.0–46.0)
Hemoglobin: 13 g/dL (ref 12.0–15.0)
Immature Granulocytes: 1 %
Lymphocytes Relative: 21 %
Lymphs Abs: 1 10*3/uL (ref 0.7–4.0)
MCH: 28.3 pg (ref 26.0–34.0)
MCHC: 32.8 g/dL (ref 30.0–36.0)
MCV: 86.3 fL (ref 80.0–100.0)
Monocytes Absolute: 0.5 10*3/uL (ref 0.1–1.0)
Monocytes Relative: 10 %
Neutro Abs: 2.9 10*3/uL (ref 1.7–7.7)
Neutrophils Relative %: 61 %
Platelets: 220 10*3/uL (ref 150–400)
RBC: 4.59 MIL/uL (ref 3.87–5.11)
RDW: 13.8 % (ref 11.5–15.5)
WBC: 4.7 10*3/uL (ref 4.0–10.5)
nRBC: 0 % (ref 0.0–0.2)

## 2021-02-24 LAB — COMPREHENSIVE METABOLIC PANEL
ALT: 7 U/L (ref 0–44)
AST: 21 U/L (ref 15–41)
Albumin: 3.8 g/dL (ref 3.5–5.0)
Alkaline Phosphatase: 38 U/L (ref 38–126)
Anion gap: 8 (ref 5–15)
BUN: 10 mg/dL (ref 8–23)
CO2: 27 mmol/L (ref 22–32)
Calcium: 9.5 mg/dL (ref 8.9–10.3)
Chloride: 101 mmol/L (ref 98–111)
Creatinine, Ser: 0.85 mg/dL (ref 0.44–1.00)
GFR, Estimated: >60 mL/min (ref >60)
Glucose, Bld: 127 mg/dL — ABNORMAL HIGH (ref 70–99)
Potassium: 4 mmol/L (ref 3.5–5.1)
Sodium: 136 mmol/L (ref 135–145)
Total Bilirubin: 0.3 mg/dL (ref 0.3–1.2)
Total Protein: 6.7 g/dL (ref 6.5–8.1)

## 2021-02-24 MED ORDER — DENOSUMAB 60 MG/ML ~~LOC~~ SOSY
60.0000 mg | PREFILLED_SYRINGE | Freq: Once | SUBCUTANEOUS | Status: AC
  Administered 2021-02-24: 60 mg via SUBCUTANEOUS

## 2021-02-24 NOTE — Therapy (Signed)
[facility] [address], [place], [address] Phone: [phone]   Fax:  [phone]  Physical Therapy Treatment  Patient Details  Name: Kendra [name] MRN: [id number] Date of Birth: 1946/07/17 No data recorded  Encounter Date: 02/24/2021   PT End of Session - 02/24/21 1432    Visit Number 61    Number of Visits 100    Authorization Type Recert at visit 68    Authorization Time Period until 06/12/2020    PT Start Time 1345    PT Stop Time 1427    PT Time Calculation (min) 42 min    Equipment Utilized During Treatment Gait belt    Activity Tolerance Patient tolerated treatment well    Behavior During Therapy WFL for tasks assessed/performed           Past Medical History:  Diagnosis Date  . Anxiety   . Breast cancer ([place]) 03/2018   right breast cancer at 11:00 and 1:00  . Bursitis    bilateral hips and knees  . Complication of anesthesia   . Depression   . Family history of breast cancer 03/24/2018  . Family history of lung cancer 03/24/2018  . History of alcohol dependence ([place]) 2007   no ETOH; resolved since 2007  . History of hiatal hernia   . History of psychosis 2014   due to sleep disturbance  . Hypertension   . Parkinson disease ([place])   . Personal history of radiation therapy 06/2018-07/2018   right breast ca  . PONV (postoperative nausea and vomiting)     Past Surgical History:  Procedure Laterality Date  . BREAST BIOPSY Right 03/14/2018   11:00 DCIS and invasive ductal carcinoma  . BREAST BIOPSY Right 03/14/2018   1:00 Invasive ductal carcinoma  . BREAST LUMPECTOMY Right 04/27/2018   lumpectomy of 11 and 1:00 cancers, clear margins, negative LN  . BREAST LUMPECTOMY WITH RADIOACTIVE SEED AND SENTINEL LYMPH NODE BIOPSY Right 04/27/2018   Procedure: RIGHT BREAST LUMPECTOMY WITH RADIOACTIVE SEED X 2 AND RIGHT SENTINEL LYMPH NODE BIOPSY;  Surgeon: [name], MD;  Location: [facility];  Service: General;  Laterality: Right;  . CATARACT EXTRACTION W/PHACO Left 08/30/2019   Procedure: CATARACT EXTRACTION PHACO AND INTRAOCULAR LENS PLACEMENT ([place]) LEFT panoptix toric  01:20.5  12.7%  10.26;  Surgeon: Leandrew Koyanagi, MD;  Location: [facility];  Service: Ophthalmology;  Laterality: Left;  . CATARACT EXTRACTION W/PHACO Right 09/20/2019   Procedure: CATARACT EXTRACTION PHACO AND INTRAOCULAR LENS PLACEMENT ([facility]) RIGHT 5.71  00:36.4  15.7%;  Surgeon: Leandrew Koyanagi, MD;  Location: [facility];  Service: Ophthalmology;  Laterality: Right;  . LAPAROTOMY N/A 02/10/2018   Procedure: EXPLORATORY LAPAROTOMY;  Surgeon: Isabel Caprice, MD;  Location: [facility];  Service: Gynecology;  Laterality: N/A;  . MASS EXCISION  01/2018   abdominal  . OVARIAN CYST REMOVAL    . SALPINGOOPHORECTOMY Bilateral 02/10/2018   Procedure: BILATERAL SALPINGO OOPHORECTOMY; PERITONEAL WASHINGS;  Surgeon: Isabel Caprice, MD;  Location: [facility];  Service: Gynecology;  Laterality: Bilateral;  . TONSILLECTOMY    . TONSILLECTOMY AND ADENOIDECTOMY  1953    There were no vitals filed for this visit.   Subjective Assessment - 02/24/21 1348    Subjective Patient reports no dizziness today, just that she feels a little foggy. Reports no pain. Patient reports compliance with her new HEP. Is trying to increase her speed as able.    Pertinent History  Patient report unclear regarding onset. Visited MD on Sept 16 and received injection with relief for approximately 1 month. Reports she cannot receive an additional injection pending eye surgery. Received dx from MD of hip bursitis in B hips. Reports pain with walking, stanindg, sitting 5/10 current 7/10 worst 3/10 best, with temporary relief when she is sitting "just right" and with naproxen. Agg: activity. Cormorbidities: hx of breast cancer, HTN, MCI, insomnia, cataracts    Limitations Sitting;Standing;Walking;House hold activities     How long can you sit comfortably? Pain constant, reduced with neutral pelvic tilt    How long can you stand comfortably? Pain constant, increases with time    How long can you walk comfortably? Pain constant, increases with time    Patient Stated Goals Be able to move without hurting, walk 1 hour    Pain Onset More than a month ago              TREATMENT Neuromuscular rehab Nustep 65mins with 50sec "normal speed" 10sec "fast as possible speed" x5 rounds for increased endurance and tolerance to speed change Mini squat 3 x 10  with max cuing initially for proper technique of therex with good eventual carry over Lateral stepping with visual focus on wall to prevent dizziness (difficulty with changing environment of cars at window) 2x 20 (10R 10L) Walking with EC 4 x 64ft with moderate guarding from therapist  Backwards walking 538ft with ball tosses with moderate guarding from therapist. Incorporated cognitive tasks asking patient to name different foods/flowers while ambulating  Walking with horizontal and vertical head turns with patient 262ft, with rest needed with pursed lip breathing (normal O2 levels, observable labored breathing, with good carry over) Education on breath control and energy conservation as it relates to PKD with good understanding             PT Education - 02/24/21 1351    Education provided Yes    Education Details therex form/technique    Person(s) Educated Patient    Methods Explanation    Comprehension Verbalized understanding            PT Short Term Goals - 01/14/21 1029      PT SHORT TERM GOAL #1   Title Patient will demonstrate complaince with HEP to speed recovery and reduce total number of visits.    Baseline HEP given; 01/14/2021: Independent with HEP    Time 2    Period Weeks    Status Achieved    Target Date 09/27/19             PT Long Term Goals - 01/13/21 1328      PT LONG TERM GOAL #1   Title Patient will improve hip  strength to 4/5 grossly to facilitate improved mobility with ADLs.    Baseline 3, 4-; 12/22 deferred to NV; 11/09/2019: 4-/5; 12/07/2019 L hip flexor 4, abd and ext 3+; 01/29/2020: 4/5 for most hip motions, 3+/5 for abd, ext; 03/20/2020: 4/5 for hip movements    Time 8    Period Weeks    Status Achieved      PT LONG TERM GOAL #2   Title Patient will demonstrate walking tolerance increased to 30 minutes without increase in symptoms to demonstrate improved ability to perform walking program for cardiovascular health.    Baseline 10 minute tolerance (patient report); 12/22 10 minutes (per pt report); 11/09/2019: 7 min; 12/07/2019 unable to assess due to icy streets;  01/29/2020: 10 min; 03/20/2020 10 min; 05/14/2020:  10 min x 3 per day; 06/11/2020: 43min x 3 per day; 07/15/2020: 36min x 3 per day; 09/02/2020: 83min x 3 (just standing rest breaks in between); 11/06/2019: 10 min x 3    Time 8    Period Weeks    Status Partially Met      PT LONG TERM GOAL #3   Title Patient will report worst pain of 3/10 during therapy to demonstrate reduced disability    Baseline Worst 7/10; 12/22 worst 9/10; 11/09/2019: 9/10; 12/07/2019 7/10; 01/29/2020: 7/10; 05/14/2020: 0/ 10    Time 8    Period Weeks    Status Achieved      PT LONG TERM GOAL #4   Title Patient will demonstrate ability to perform all bed mobility maneuvers without increase in pain for reduced disability with mobility and transfers with ADLs.    Baseline Reports pain with bed mobility supine <> sidelying L/R, sidelying to sitting EOB; 12/22 pt able to perform I without increased pain    Time 8    Period Weeks    Status Achieved      PT LONG TERM GOAL #5   Title Patient will improve 10MW speed to 1.0 m/sec to meet norms for minimal fall risk and full community ambulation.    Baseline 0.88 m/sec; 12/22 0.96 m/sec; 11/09/2019: deferred; 12/07/2019 0.90 m/sec; 01/29/2020: .9  m/sec; 03/20/2020: .54m/s; 05/14/2020: . 9 m/s; 06/11/2020: .21m/s; 07/15/2020: 1 m/s    Time 8     Period Weeks    Status Achieved      Additional Long Term Goals   Additional Long Term Goals Yes      PT LONG TERM GOAL #6   Title Patient will increase 6MWT by 200 ft to demonstrate improvement in functional capacity for transition to cardiac walking program    Baseline 6MWT = 1195; 1/7/20201: deferred; 12/07/2019 1095; 01/29/2020: deferred secondary to fatigue; 03/20/2020: 1011ft; 07/15/2020: 1174ft; 09/03/2020: 858ft; 11/05/2020: 975ft; 01/13/2021: 1012ft    Time 8    Period Weeks    Status On-going      PT LONG TERM GOAL #7   Title Patient will improve her 5xsts to under 15 sec to indicate singificnat improvement in LE functional strength and decrease in fall risk.    Baseline 24.5 sec; 03/20/2020: 19.19 sec; 06/13/2020: 14. 92    Time 6    Period Weeks    Status Achieved      PT LONG TERM GOAL #8   Title Patient will improve FGA to 28/30 to indicate improvement in dynamic balance and improvement in stability.    Baseline 23/30; 06/11/2020: 25/30; 07/15/2020: 26/30; 09/02/2020: 27/30; 11/05/2020: 28/30    Time 6    Period Weeks    Status Achieved      PT LONG TERM GOAL  #9   TITLE Patient will be able to ascend/desend the stairs without use of UEs to increase safety and decrease fall risk with stair use.    Baseline requires use of UEs; 01/13/2021: able to perform with CGA    Time 6    Period Weeks    Status Achieved      PT LONG TERM GOAL  #10   TITLE Patient will improve her TUG scores to under 12 sec for normal tug; 15 sec for cognitive TUG to decrease fall risk overall    Baseline normal: 17.2; 27.3 sec    Time 6    Period Weeks    Status New  Plan - 02/24/21 1434    Clinical Impression Statement Therapy session continues to focus on neuromuscular training and incorporating activities that challange the vestibular system such as ambulating with horizontal and vertical head turns. Patient still has some difficulty with gait speed changes by slowing down and  decreasing step length while walking with EC. Patient required frequent rest breaks as a result of increases dizziness while performing exercises. Patient will continue to further benefit from skilled therapy to improve functional capacity.    Personal Factors and Comorbidities Age;Comorbidity 3+;Time since onset of injury/illness/exacerbation;Comorbidity 2;Other    Comorbidities MCI, HTN, CA, cataracts    Examination-Activity Limitations Bend;Sit;Squat;Stairs;Stand;Sleep    Examination-Participation Restrictions Cleaning;Community Activity    Stability/Clinical Decision Making Evolving/Moderate complexity    Rehab Potential Fair    Clinical Impairments Affecting Rehab Potential MCI    PT Frequency 2x / week    PT Duration 6 weeks    PT Treatment/Interventions ADLs/Self Care Home Management;Therapeutic exercise;Patient/family education;Neuromuscular re-education;Therapeutic activities;Functional mobility training;Balance training;Manual techniques;Gait training;Stair training;Moist Heat;Electrical Stimulation;Cryotherapy;Taping;Dry needling;Energy conservation;Passive range of motion;Joint Manipulations    PT Next Visit Plan Progress glute therex, pelvic-hip dissociation; check and progress HEP with stretches    PT Home Exercise Plan lateral step ups, step ups, hip kickers ext/abd red theraband    Consulted and Agree with Plan of Care Patient           Patient will benefit from skilled therapeutic intervention in order to improve the following deficits and impairments:  Decreased range of motion,Postural dysfunction,Pain,Abnormal gait,Decreased activity tolerance,Decreased coordination,Decreased endurance,Decreased balance,Decreased mobility,Difficulty walking,Hypomobility,Increased muscle spasms,Impaired perceived functional ability,Decreased strength,Impaired flexibility  Visit Diagnosis: Difficulty in walking, not elsewhere classified  Muscle weakness (generalized)     Problem  List Patient Active Problem List   Diagnosis Date Noted  . Osteopenia of neck of right femur 09/06/2020  . Class 1 obesity with serious comorbidity and body mass index (BMI) of 32.0 to 32.9 in adult 08/29/2020  . Candidal skin infection 04/29/2020  . Parkinson disease ([place]) 02/22/2020  . 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 Bipolar disorder, in full remission, most recent episode depressed ([place]) 11/08/2019  . Avitaminosis D 08/24/2019  . Insomnia due to mental condition 05/31/2019  . Bipolar I disorder, most recent episode depressed ([place]) 05/31/2019  . Cognitive disorder 05/31/2019  . Alcohol use disorder, moderate, in sustained remission ([place]) 05/31/2019  . Elevated tumor markers 05/27/2019  . GAD (generalized anxiety disorder) 04/10/2019  . MDD (major depressive disorder), severe ([place]) 01/31/2019  . High serum carbohydrate antigen 19-9 (CA19-9) 12/01/2018  . Adjustment disorder with anxious mood 11/24/2018  . Essential hypertension 11/24/2018  . B12 deficiency 10/17/2018  . Incisional hernia, without obstruction or gangrene 09/12/2018  . Genetic testing 04/07/2018  . Malignant neoplasm of upper-outer quadrant of right breast in female, estrogen receptor positive ([place]) 03/17/2018  . Malignant neoplasm of upper-inner quadrant of right breast in female, estrogen receptor positive ([place]) 03/17/2018  . History of psychosis 02/07/2018  . History of insomnia 02/07/2018   Durwin Reges DPT Durwin Reges 02/24/2021, 2:51 PM  [facility] [address], [place], [address] Phone: [phone]   Fax:  [phone]  Name: Kendra [name] MRN: [id number] Date of Birth: May 01, 1946

## 2021-02-25 LAB — CANCER ANTIGEN 19-9: CA 19-9: 37 U/mL — ABNORMAL HIGH (ref 0–35)

## 2021-02-25 LAB — CANCER ANTIGEN 27.29: CA 27.29: 20.9 U/mL (ref 0.0–38.6)

## 2021-02-25 LAB — CA 125: Cancer Antigen (CA) 125: 8.1 U/mL (ref 0.0–38.1)

## 2021-02-27 ENCOUNTER — Ambulatory Visit: Payer: Medicare PPO | Admitting: Family Medicine

## 2021-02-27 ENCOUNTER — Other Ambulatory Visit: Payer: Self-pay

## 2021-02-27 ENCOUNTER — Encounter: Payer: Self-pay | Admitting: Family Medicine

## 2021-02-27 VITALS — BP 132/75 | HR 82 | Temp 97.2°F | Resp 18 | Wt 209.0 lb

## 2021-02-27 DIAGNOSIS — E669 Obesity, unspecified: Secondary | ICD-10-CM

## 2021-02-27 DIAGNOSIS — E538 Deficiency of other specified B group vitamins: Secondary | ICD-10-CM

## 2021-02-27 DIAGNOSIS — I10 Essential (primary) hypertension: Secondary | ICD-10-CM

## 2021-02-27 DIAGNOSIS — G20 Parkinson's disease: Secondary | ICD-10-CM | POA: Diagnosis not present

## 2021-02-27 DIAGNOSIS — Z6833 Body mass index (BMI) 33.0-33.9, adult: Secondary | ICD-10-CM | POA: Diagnosis not present

## 2021-02-27 DIAGNOSIS — E559 Vitamin D deficiency, unspecified: Secondary | ICD-10-CM

## 2021-02-27 DIAGNOSIS — R195 Other fecal abnormalities: Secondary | ICD-10-CM

## 2021-02-27 NOTE — Addendum Note (Signed)
Addended by: Kelton Pillar on: 02/27/2021 04:07 PM   Modules accepted: Orders

## 2021-02-27 NOTE — Assessment & Plan Note (Signed)
Discussed importance of healthy weight management Discussed diet and exercise  

## 2021-02-27 NOTE — Assessment & Plan Note (Signed)
Followed by neurology She has run out of Sinemet and is not feeling well Encouraged her to check with the pharmacy to see if she can get a temporary supply until they get the rest in

## 2021-02-27 NOTE — Progress Notes (Signed)
Established patient visit   Patient: Kendra [name]   DOB: September 01, 1946   75 y.o. Female  MRN: [id number] Visit Date: 02/27/2021  Today's healthcare provider: Lavon Paganini, MD   Chief Complaint  Patient presents with  . Hypertension   Subjective    Hypertension Pertinent negatives include no chest pain, palpitations or shortness of breath.    Hypertension, follow-up  BP Readings from Last 3 Encounters:  02/27/21 132/75  02/24/21 119/67  01/30/21 114/62   Wt Readings from Last 3 Encounters:  02/27/21 209 lb (94.8 kg)  02/24/21 208 lb 15.9 oz (94.8 kg)  01/30/21 206 lb 3.2 oz (93.5 kg)     She was last seen for hypertension 6 months ago.  BP at that visit was 122/66. Management since that visit includes continue same medications.  She reports good compliance with treatment. She is not having side effects.  She is following a Regular diet. She is exercising. She does not smoke.  Use of agents associated with hypertension: none.   Outside blood pressures are not checked.. Symptoms: No chest pain No chest pressure  No palpitations No syncope  No dyspnea No orthopnea  No paroxysmal nocturnal dyspnea Yes lower extremity edema   Pertinent labs: Lab Results  Component Value Date   CHOL 173 04/13/2019   HDL 62 04/13/2019   LDLCALC 95 04/13/2019   TRIG 78 04/13/2019   Lab Results  Component Value Date   NA 136 02/24/2021   K 4.0 02/24/2021   CREATININE 0.85 02/24/2021   GFRNONAA >60 02/24/2021   GFRAA >60 04/29/2020   GLUCOSE 127 (H) 02/24/2021     The 10-year ASCVD risk score Mikey Bussing DC Jr., et al., 2013) is: 17.6%* (Cholesterol units were assumed)   ---------------------------------------------------------------------------------------------------  Follow up for Vitamin B 12 deficiency:  The patient was last seen for this 6 months ago.      Changes made at last visit include none. Patient was advised to      continue monthly injections at  home. Recheck levels in 6 months.  She reports good compliance with treatment. She feels that condition has improved.  She is not having side effects.    -----------------------------------------------------------------------------------------      Medications: Outpatient Medications Prior to Visit  Medication Sig  . Acetaminophen 500 MG capsule Take 1,000 mg by mouth every 6 (six) hours as needed.   . carbidopa-levodopa (SINEMET IR) 25-100 MG tablet Take 1 tablet by mouth at bedtime.   . cyanocobalamin (,VITAMIN B-12,) 1000 MCG/ML injection Inject 1 mL (1,000 mcg total) into the muscle every 30 (thirty) days.  Marland Kitchen denosumab (PROLIA) 60 MG/ML SOSY injection Inject 60 mg into the skin every 6 (six) months. takes annually  . FLUoxetine HCl 60 MG TABS Take 60 mg by mouth daily.  Marland Kitchen letrozole (FEMARA) 2.5 MG tablet Take 1 tablet (2.5 mg total) by mouth daily.  Marland Kitchen lisinopril-hydrochlorothiazide (ZESTORETIC) 20-12.5 MG tablet Take 1 tablet by mouth daily.  . naproxen sodium (ALEVE) 220 MG tablet Take 220 mg by mouth 2 (two) times daily as needed.   Marland Kitchen NYAMYC powder APPLY TOPICALLY THREE TIMES A DAY  . polyethylene glycol (MIRALAX / GLYCOLAX) 17 g packet Take 17 g by mouth daily.  . risperiDONE (RISPERDAL) 2 MG tablet Take 0.5 tablets (1 mg total) by mouth 2 (two) times daily.  . traZODone (DESYREL) 100 MG tablet Take 1 tablet (100 mg total) by mouth at bedtime.   Facility-Administered Medications Prior to Visit  Medication Dose Route Frequency Provider  . lidocaine (PF) (XYLOCAINE) 1 % injection 4 mL  4 mL Intradermal Once Virginia Crews, MD  . lidocaine (PF) (XYLOCAINE) 1 % injection 4 mL  4 mL Intradermal Once Virginia Crews, MD    Review of Systems  Constitutional: Negative for appetite change, chills, fatigue and fever.  HENT: Negative for congestion, ear pain, sinus pain and sore throat.   Respiratory: Negative for cough, chest tightness and shortness of breath.    Cardiovascular: Negative for chest pain and palpitations.  Gastrointestinal: Negative for abdominal pain, blood in stool, nausea and vomiting.  Genitourinary: Negative for dysuria, flank pain, hematuria and vaginal pain.  Musculoskeletal: Positive for joint swelling (Left hand and foot). Negative for arthralgias and gait problem.  Neurological: Positive for dizziness and light-headedness.  Psychiatric/Behavioral: Positive for decreased concentration.       Objective    BP 132/75 (BP Location: Left Arm, Patient Position: Sitting, Cuff Size: Large)   Pulse 82   Temp (!) 97.2 F (36.2 C) (Temporal)   Resp 18   Wt 209 lb (94.8 kg)   SpO2 97% Comment: room air  BMI 33.73 kg/m     Physical Exam Vitals reviewed.  Constitutional:      General: She is not in acute distress.    Appearance: Normal appearance. She is well-developed. She is not diaphoretic.  HENT:     Head: Normocephalic and atraumatic.  Eyes:     General: No scleral icterus.    Conjunctiva/sclera: Conjunctivae normal.  Neck:     Thyroid: No thyromegaly.  Cardiovascular:     Rate and Rhythm: Normal rate and regular rhythm.     Pulses: Normal pulses.     Heart sounds: Normal heart sounds. No murmur heard.   Pulmonary:     Effort: Pulmonary effort is normal. No respiratory distress.     Breath sounds: Normal breath sounds. No wheezing, rhonchi or rales.  Musculoskeletal:     Cervical back: Neck supple.     Right lower leg: 2+ Pitting Edema present.     Left lower leg: 2+ Pitting Edema present.  Lymphadenopathy:     Cervical: No cervical adenopathy.  Skin:    General: Skin is warm and dry.     Findings: No rash.  Neurological:     Mental Status: She is alert and oriented to person, place, and time. Mental status is at baseline.  Psychiatric:        Mood and Affect: Mood normal.        Behavior: Behavior normal.     No results found for any visits on 02/27/21.  Assessment & Plan     Problem List Items  Addressed This Visit      Cardiovascular and Mediastinum   Essential hypertension - Primary    Well controlled Continue current medications Recheck metabolic panel F/u in 6 months         Nervous and Auditory   Parkinson disease ([place])    Followed by neurology She has run out of Sinemet and is not feeling well Encouraged her to check with the pharmacy to see if she can get a temporary supply until they get the rest in        Other   B12 deficiency   Relevant Orders   B12   Avitaminosis D   Relevant Orders   VITAMIN D 25 Hydroxy (Vit-D Deficiency, Fractures)   Obesity    Discussed importance of healthy weight management  Discussed diet and exercise       Relevant Orders   Lipid panel    Other Visit Diagnoses    Positive colorectal cancer screening using Cologuard test       Relevant Orders   Ambulatory referral to Gastroenterology      Return in about 6 months (around 08/29/2021) for AWV, CPE.      I,Essence Turner,acting as a scribe for Lavon Paganini, MD.,have documented all relevant documentation on the behalf of Lavon Paganini, MD,as directed by  Lavon Paganini, MD while in the presence of Lavon Paganini, MD.  I, Lavon Paganini, MD, have reviewed all documentation for this visit. The documentation on 02/27/21 for the exam, diagnosis, procedures, and orders are all accurate and complete.   Siren Porrata, Dionne Bucy, MD, MPH [facility]

## 2021-02-27 NOTE — Assessment & Plan Note (Signed)
Well controlled Continue current medications Recheck metabolic panel F/u in 6 months  

## 2021-02-28 LAB — VITAMIN D 25 HYDROXY (VIT D DEFICIENCY, FRACTURES): Vit D, 25-Hydroxy: 29.4 ng/mL — ABNORMAL LOW (ref 30.0–100.0)

## 2021-02-28 LAB — LIPID PANEL
Chol/HDL Ratio: 3.1 ratio (ref 0.0–4.4)
Cholesterol, Total: 185 mg/dL (ref 100–199)
HDL: 59 mg/dL (ref >39)
LDL Chol Calc (NIH): 101 mg/dL — ABNORMAL HIGH (ref 0–99)
Triglycerides: 141 mg/dL (ref 0–149)
VLDL Cholesterol Cal: 25 mg/dL (ref 5–40)

## 2021-02-28 LAB — VITAMIN B12: Vitamin B-12: 572 pg/mL (ref 232–1245)

## 2021-03-03 ENCOUNTER — Ambulatory Visit: Payer: Self-pay | Admitting: *Deleted

## 2021-03-03 NOTE — Telephone Encounter (Signed)
Pt called in and was given her lab result message from Lavon Paganini dated 02/28/2021 at 8:22 AM.  She got her vitamin D3 bottle and verified she was taking it twice a day.

## 2021-03-04 ENCOUNTER — Encounter: Payer: Medicare PPO | Admitting: Physical Therapy

## 2021-03-04 ENCOUNTER — Ambulatory Visit (INDEPENDENT_AMBULATORY_CARE_PROVIDER_SITE_OTHER): Payer: Medicare PPO | Admitting: Psychiatry

## 2021-03-04 ENCOUNTER — Other Ambulatory Visit: Payer: Self-pay

## 2021-03-04 ENCOUNTER — Encounter: Payer: Self-pay | Admitting: Psychiatry

## 2021-03-04 VITALS — BP 139/71 | HR 83 | Temp 98.3°F | Wt 211.6 lb

## 2021-03-04 DIAGNOSIS — F411 Generalized anxiety disorder: Secondary | ICD-10-CM | POA: Diagnosis not present

## 2021-03-04 DIAGNOSIS — F3176 Bipolar disorder, in full remission, most recent episode depressed: Secondary | ICD-10-CM | POA: Diagnosis not present

## 2021-03-04 DIAGNOSIS — F5105 Insomnia due to other mental disorder: Secondary | ICD-10-CM | POA: Diagnosis not present

## 2021-03-04 MED ORDER — RISPERIDONE 0.25 MG PO TABS: 0.7500 mg | ORAL_TABLET | Freq: Every day | ORAL | 0 refills | Status: DC

## 2021-03-04 MED ORDER — RISPERIDONE 2 MG PO TABS: 1.0000 mg | ORAL_TABLET | Freq: Every day | ORAL | 0 refills | Status: DC

## 2021-03-04 NOTE — Progress Notes (Signed)
[facility] MD OP Progress Note  03/04/2021 3:59 PM Kendra [name]  MRN:  [id number]  Chief Complaint:  Chief Complaint    Follow-up; Depression     HPI: Kendra [name] is a 75 year old Caucasian female, married, lives in [place], has a history of bipolar disorder, GAD, insomnia, multifocal stage I right breast cancer status post treatment-lumpectomy, ovarian cyst removal, Parkinson's disease, bilateral hearing loss was evaluated in office today.  Collateral information obtained from spouse-[name] who was present in session today.  Patient today reports overall she is doing well with regards to her mood.  Denies any significant mood swings.  Denies depression or significant anxiety symptoms and reports her anxiety is manageable.  She is compliant on medications.  Patient reports sleep is overall okay.  Patient denies any suicidality, homicidality or perceptual disturbances.  She does have Parkinson's disease and reports she is doing okay on the current medication regimen.  However it can be anxiety provoking since she reports she has myalgia all over and also she can have exacerbation of Parkinson's disease anytime of the day, it comes and goes.  She is currently in physical therapy which helps.  According to spouse patient is currently doing overall well.  She does have mild memory problems which likely is age-related, it has not worsened since her last visit.  She is compliant on medications.    Visit Diagnosis:    ICD-10-CM   1. Bipolar disorder, in full remission, most recent episode depressed ([place])  F31.76 risperiDONE (RISPERDAL) 2 MG tablet    risperiDONE (RISPERDAL) 0.25 MG tablet  2. GAD (generalized anxiety disorder)  F41.1   3. Insomnia due to mental condition  F51.05     Past Psychiatric History: I have reviewed past psychiatric history from progress note on 03/01/2019.  Past trials of risperidone, Prozac, Seroquel, Ativan  Past Medical History:  Past Medical History:   Diagnosis Date  . Anxiety   . Breast cancer ([place]) 03/2018   right breast cancer at 11:00 and 1:00  . Bursitis    bilateral hips and knees  . Complication of anesthesia   . Depression   . Family history of breast cancer 03/24/2018  . Family history of lung cancer 03/24/2018  . History of alcohol dependence ([place]) 2007   no ETOH; resolved since 2007  . History of hiatal hernia   . History of psychosis 2014   due to sleep disturbance  . Hypertension   . Parkinson disease ([place])   . Personal history of radiation therapy 06/2018-07/2018   right breast ca  . PONV (postoperative nausea and vomiting)     Past Surgical History:  Procedure Laterality Date  . BREAST BIOPSY Right 03/14/2018   11:00 DCIS and invasive ductal carcinoma  . BREAST BIOPSY Right 03/14/2018   1:00 Invasive ductal carcinoma  . BREAST LUMPECTOMY Right 04/27/2018   lumpectomy of 11 and 1:00 cancers, clear margins, negative LN  . BREAST LUMPECTOMY WITH RADIOACTIVE SEED AND SENTINEL LYMPH NODE BIOPSY Right 04/27/2018   Procedure: RIGHT BREAST LUMPECTOMY WITH RADIOACTIVE SEED X 2 AND RIGHT SENTINEL LYMPH NODE BIOPSY;  Surgeon: [name], MD;  Location: [facility];  Service: General;  Laterality: Right;  . CATARACT EXTRACTION W/PHACO Left 08/30/2019   Procedure: CATARACT EXTRACTION PHACO AND INTRAOCULAR LENS PLACEMENT ([place]) LEFT panoptix toric  01:20.5  12.7%  10.26;  Surgeon: Leandrew Koyanagi, MD;  Location: [facility];  Service: Ophthalmology;  Laterality: Left;  . CATARACT EXTRACTION W/PHACO Right 09/20/2019   Procedure:  CATARACT EXTRACTION PHACO AND INTRAOCULAR LENS PLACEMENT ([facility]) RIGHT 5.71  00:36.4  15.7%;  Surgeon: Leandrew Koyanagi, MD;  Location: [facility];  Service: Ophthalmology;  Laterality: Right;  . LAPAROTOMY N/A 02/10/2018   Procedure: EXPLORATORY LAPAROTOMY;  Surgeon: Isabel Caprice, MD;  Location: [facility];  Service: Gynecology;  Laterality: N/A;  . MASS  EXCISION  01/2018   abdominal  . OVARIAN CYST REMOVAL    . SALPINGOOPHORECTOMY Bilateral 02/10/2018   Procedure: BILATERAL SALPINGO OOPHORECTOMY; PERITONEAL WASHINGS;  Surgeon: Isabel Caprice, MD;  Location: [facility];  Service: Gynecology;  Laterality: Bilateral;  . TONSILLECTOMY    . TONSILLECTOMY AND ADENOIDECTOMY  1952    Family Psychiatric History: I have reviewed family psychiatric history from progress note on 03/01/2019  Family History:  Family History  Problem Relation Age of Onset  . Diabetes Mother   . Breast cancer Mother 11  . Hypertension Mother   . Lung cancer Father        asbestos exposure  . Heart disease Brother 2  . Breast cancer Cousin 47       paternal cousin  . Heart disease Paternal Grandmother 27  . Heart disease Paternal Grandfather     Social History: Reviewed social history from progress note on 03/01/2019 Social History   Socioeconomic History  . Marital status: Married    Spouse name: robert  . Number of children: 0  . Years of education: Not on file  . Highest education level: Some college, no degree  Occupational History  . Occupation: retired Furniture conservator/restorer of education  Tobacco Use  . Smoking status: Never Smoker  . Smokeless tobacco: Never Used  Vaping Use  . Vaping Use: Never used  Substance and Sexual Activity  . Alcohol use: Not Currently    Comment: alcohol dependence prior to 2007  . Drug use: Never  . Sexual activity: Yes    Partners: Male    Birth control/protection: Surgical  Other Topics Concern  . Not on file  Social History Narrative  . Not on file   Social Determinants of Health   Financial Resource Strain: Low Risk   . Difficulty of Paying Living Expenses: Not hard at all  Food Insecurity: No Food Insecurity  . Worried About Charity fundraiser in the Last Year: Never true  . Ran Out of Food in the Last Year: Never true  Transportation Needs: No Transportation Needs  . Lack of Transportation  (Medical): No  . Lack of Transportation (Non-Medical): No  Physical Activity: Inactive  . Days of Exercise per Week: 0 days  . Minutes of Exercise per Session: 0 min  Stress: Stress Concern Present  . Feeling of Stress : To some extent  Social Connections: Moderately Integrated  . Frequency of Communication with Friends and Family: More than three times a week  . Frequency of Social Gatherings with Friends and Family: More than three times a week  . Attends Religious Services: Never  . Active Member of Clubs or Organizations: Yes  . Attends Archivist Meetings: 1 to 4 times per year  . Marital Status: Married    Allergies:  Allergies  Allergen Reactions  . Hydroxyzine Anaphylaxis    Tongue swollen  . Other Other (See Comments)    Food poisoning   . Shrimp [Shellfish Allergy] Other (See Comments)    Food poisoning     Metabolic Disorder Labs: Lab Results  Component Value Date   HGBA1C 5.4 04/13/2019  Lab Results  Component Value Date   PROLACTIN 101.9 06/20/2015   Lab Results  Component Value Date   CHOL 185 02/27/2021   TRIG 141 02/27/2021   HDL 59 02/27/2021   CHOLHDL 3.1 02/27/2021   LDLCALC 101 (H) 02/27/2021   LDLCALC 95 04/13/2019   Lab Results  Component Value Date   TSH 2.31 04/13/2019   TSH 2.543 07/25/2018    Therapeutic Level Labs: No results found for: LITHIUM No results found for: VALPROATE No components found for:  CBMZ  Current Medications: Current Outpatient Medications  Medication Sig Dispense Refill  . Acetaminophen 500 MG capsule Take 1,000 mg by mouth every 6 (six) hours as needed.     . carbidopa-levodopa (SINEMET IR) 25-100 MG tablet Take 1 tablet by mouth at bedtime.     . cyanocobalamin (,VITAMIN B-12,) 1000 MCG/ML injection Inject 1 mL (1,000 mcg total) into the muscle every 30 (thirty) days. 3 mL 3  . denosumab (PROLIA) 60 MG/ML SOSY injection Inject 60 mg into the skin every 6 (six) months. takes annually    .  FLUoxetine HCl 60 MG TABS Take 60 mg by mouth daily. 90 tablet 1  . letrozole (FEMARA) 2.5 MG tablet Take 1 tablet (2.5 mg total) by mouth daily. 90 tablet 3  . lisinopril-hydrochlorothiazide (ZESTORETIC) 20-12.5 MG tablet Take 1 tablet by mouth daily. 90 tablet 3  . naproxen sodium (ALEVE) 220 MG tablet Take 220 mg by mouth 2 (two) times daily as needed.     Marland Kitchen NYAMYC powder APPLY TOPICALLY THREE TIMES A DAY 15 g 1  . polyethylene glycol (MIRALAX / GLYCOLAX) 17 g packet Take 17 g by mouth daily.    . risperiDONE (RISPERDAL) 0.25 MG tablet Take 3 tablets (0.75 mg total) by mouth daily. 270 tablet 0  . traZODone (DESYREL) 100 MG tablet Take 1 tablet (100 mg total) by mouth at bedtime. 90 tablet 0  . risperiDONE (RISPERDAL) 2 MG tablet Take 0.5 tablets (1 mg total) by mouth at bedtime. 45 tablet 0   Current Facility-Administered Medications  Medication Dose Route Frequency Provider Last Rate Last Admin  . lidocaine (PF) (XYLOCAINE) 1 % injection 4 mL  4 mL Intradermal Once Virginia Crews, MD      . lidocaine (PF) (XYLOCAINE) 1 % injection 4 mL  4 mL Intradermal Once Bacigalupo, Dionne Bucy, MD         Musculoskeletal: Strength & Muscle Tone: [employer]: normal Patient leans: N/A  Psychiatric Specialty Exam: Review of Systems  HENT: Positive for hearing loss.   Musculoskeletal: Positive for myalgias.  Neurological: Positive for tremors.  Psychiatric/Behavioral: The patient is nervous/anxious.   All other systems reviewed and are negative.   Blood pressure 139/71, pulse 83, temperature 98.3 F (36.8 C), temperature source Temporal, weight 211 lb 9.6 oz (96 kg).Body mass index is 34.15 kg/m.  General Appearance: Casual  Eye Contact:  Fair  Speech:  Normal Rate  Volume:  Normal  Mood:  Anxious Coping well  Affect:  Congruent  Thought Process:  Goal Directed and Descriptions of Associations: Intact  Orientation:  Full (Time, Place, and Person)  Thought Content: Logical    Suicidal Thoughts:  No  Homicidal Thoughts:  No  Memory:  Immediate;   Fair Recent;   Fair Remote;   Fair  Judgement:  Fair  Insight:  Fair  Psychomotor Activity:  Tremor  Concentration:  Concentration: Fair and Attention Span: Fair  Recall:  [employer] of  Knowledge: Fair  Language: Fair  Akathisia:  No  Handed:  Right  AIMS (if indicated): done  Assets:  Communication Skills Desire for [employer] Talents/Skills Transportation  ADL's:  Intact  Cognition: WNL  Sleep:  Fair   Screenings: Home Office Visit from 01/21/2021 in [facility] Total Score 0    GAD-7   Flowsheet Row Counselor 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 [facility]  Total GAD-7 Score 7    PHQ2-9   [place] Visit from 02/27/2021 in [facility] Visit from 01/21/2021 in [facility] from 08/29/2020 in [facility] 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 [facility] Office Visit from 11/24/2018 in [facility]  PHQ-2 Total Score 2 0 2 3 0  PHQ-9 Total Score 8 -- 6 17 3     Flowsheet Row Counselor from 02/03/2021 in [facility] from 01/21/2021 in [facility] Counselor from 01/01/2021 in [facility] No Risk No Risk No Risk       Assessment and Plan: Kendra [name] is a 75 year old Caucasian female, married, lives in [place], has a history of bipolar disorder, GAD, insomnia, multiple medical problems including primary Parkinson's disease, hearing loss was evaluated in office today.  She is biologically predisposed given her multiple health issues.  Patient is currently stable.  Plan as noted below.  Plan Bipolar disorder in remission We will reduce risperidone to 0.75 mg p.o.  daily in the morning and 1 mg p.o. nightly Prozac 60 mg p.o. daily   GAD-stable Continue CBT with Ms. Christina Hussami Prozac 60 mg p.o. daily  Insomnia-stable Trazodone 50 to 100 mg p.o. nightly as needed Melatonin as needed  Completed MMSE in session today- patient scored 28/30 .  She will continue to follow-up with neurologist.  Collateral information obtained from husband as noted above.  Patient as well as husband advised to monitor symptoms closely since risperidone is currently tapered down.  The dosage tapered downward since her symptoms has been stable and due to long-term side effects of neuroleptic medications.  Follow-up in clinic in 2 weeks or sooner if needed.  This note was generated in part or whole with voice recognition software. Voice recognition is usually quite accurate but there are transcription errors that can and very often do occur. I apologize for any typographical errors that were not detected and corrected.        Ursula Alert, MD 03/05/2021, 12:40 PM

## 2021-03-04 NOTE — Patient Instructions (Signed)
Your risperidone dosage has changed.  Continue risperidone 1 mg (  half tablet of 2 mg)  at bedtime.  However during the day start taking risperidone 0.75 mg -(3 tablets of the 0.25 mg) in the a.m.  All other medications remain the same.

## 2021-03-05 ENCOUNTER — Encounter: Payer: Medicare PPO | Admitting: Physical Therapy

## 2021-03-05 ENCOUNTER — Encounter: Payer: Self-pay | Admitting: Physical Therapy

## 2021-03-05 ENCOUNTER — Ambulatory Visit: Payer: Medicare PPO | Attending: Family Medicine | Admitting: Physical Therapy

## 2021-03-05 DIAGNOSIS — M25552 Pain in left hip: Secondary | ICD-10-CM | POA: Diagnosis not present

## 2021-03-05 DIAGNOSIS — M25622 Stiffness of left elbow, not elsewhere classified: Secondary | ICD-10-CM | POA: Insufficient documentation

## 2021-03-05 DIAGNOSIS — M6281 Muscle weakness (generalized): Secondary | ICD-10-CM | POA: Diagnosis not present

## 2021-03-05 DIAGNOSIS — R262 Difficulty in walking, not elsewhere classified: Secondary | ICD-10-CM | POA: Diagnosis not present

## 2021-03-05 DIAGNOSIS — M25522 Pain in left elbow: Secondary | ICD-10-CM | POA: Diagnosis not present

## 2021-03-05 DIAGNOSIS — M25551 Pain in right hip: Secondary | ICD-10-CM | POA: Insufficient documentation

## 2021-03-05 NOTE — Therapy (Signed)
[facility] [address], [place], [address] Phone: [phone]   Fax:  [phone]  Physical Therapy Treatment  Patient Details  Name: Kendra [name] MRN: [id number] Date of Birth: 03-01-1946 No data recorded  Encounter Date: 03/05/2021   PT End of Session - 03/05/21 1022    Visit Number 99    Number of Visits 100    Date for PT Re-Evaluation 04/02/21    Authorization Type Recert at visit 63    Authorization Time Period until 06/12/2020    PT Start Time 0945    PT Stop Time 1029    PT Time Calculation (min) 44 min    Equipment Utilized During Treatment Gait belt    Activity Tolerance Patient tolerated treatment well    Behavior During Therapy WFL for tasks assessed/performed           Past Medical History:  Diagnosis Date  . Anxiety   . Breast cancer ([place]) 03/2018   right breast cancer at 11:00 and 1:00  . Bursitis    bilateral hips and knees  . Complication of anesthesia   . Depression   . Family history of breast cancer 03/24/2018  . Family history of lung cancer 03/24/2018  . History of alcohol dependence ([place]) 2007   no ETOH; resolved since 2007  . History of hiatal hernia   . History of psychosis 2014   due to sleep disturbance  . Hypertension   . Parkinson disease ([place])   . Personal history of radiation therapy 06/2018-07/2018   right breast ca  . PONV (postoperative nausea and vomiting)     Past Surgical History:  Procedure Laterality Date  . BREAST BIOPSY Right 03/14/2018   11:00 DCIS and invasive ductal carcinoma  . BREAST BIOPSY Right 03/14/2018   1:00 Invasive ductal carcinoma  . BREAST LUMPECTOMY Right 04/27/2018   lumpectomy of 11 and 1:00 cancers, clear margins, negative LN  . BREAST LUMPECTOMY WITH RADIOACTIVE SEED AND SENTINEL LYMPH NODE BIOPSY Right 04/27/2018   Procedure: RIGHT BREAST LUMPECTOMY WITH RADIOACTIVE SEED X 2 AND RIGHT SENTINEL LYMPH NODE BIOPSY;  Surgeon:  [name], MD;  Location: [facility];  Service: General;  Laterality: Right;  . CATARACT EXTRACTION W/PHACO Left 08/30/2019   Procedure: CATARACT EXTRACTION PHACO AND INTRAOCULAR LENS PLACEMENT ([place]) LEFT panoptix toric  01:20.5  12.7%  10.26;  Surgeon: Leandrew Koyanagi, MD;  Location: [facility];  Service: Ophthalmology;  Laterality: Left;  . CATARACT EXTRACTION W/PHACO Right 09/20/2019   Procedure: CATARACT EXTRACTION PHACO AND INTRAOCULAR LENS PLACEMENT ([facility]) RIGHT 5.71  00:36.4  15.7%;  Surgeon: Leandrew Koyanagi, MD;  Location: [facility];  Service: Ophthalmology;  Laterality: Right;  . LAPAROTOMY N/A 02/10/2018   Procedure: EXPLORATORY LAPAROTOMY;  Surgeon: Isabel Caprice, MD;  Location: [facility];  Service: Gynecology;  Laterality: N/A;  . MASS EXCISION  01/2018   abdominal  . OVARIAN CYST REMOVAL    . SALPINGOOPHORECTOMY Bilateral 02/10/2018   Procedure: BILATERAL SALPINGO OOPHORECTOMY; PERITONEAL WASHINGS;  Surgeon: Isabel Caprice, MD;  Location: [facility];  Service: Gynecology;  Laterality: Bilateral;  . TONSILLECTOMY    . TONSILLECTOMY AND ADENOIDECTOMY  1953    There were no vitals filed for this visit.   Subjective Assessment - 03/05/21 0949    Subjective Patient reports no dizziness today but feels foggy since arriving. Patient reports no pain today. Patient reports her exercises are going slowly while she continues to get  used to them, but she continues to get her walking in.    Pertinent History Patient report unclear regarding onset. Visited MD on Sept 16 and received injection with relief for approximately 1 month. Reports she cannot receive an additional injection pending eye surgery. Received dx from MD of hip bursitis in B hips. Reports pain with walking, stanindg, sitting 5/10 current 7/10 worst 3/10 best, with temporary relief when she is sitting "just right" and with naproxen. Agg: activity. Cormorbidities: hx of breast cancer,  HTN, MCI, insomnia, cataracts    Limitations Sitting;Standing;Walking;House hold activities    How long can you sit comfortably? Pain constant, reduced with neutral pelvic tilt    How long can you stand comfortably? Pain constant, increases with time    How long can you walk comfortably? Pain constant, increases with time    Patient Stated Goals Be able to move without hurting, walk 1 hour    Currently in Pain? No/denies    Pain Score 0-No pain           Neuromuscular rehab  Nustep 5 mins with 50sec "normal speed" 10sec "fast as possible speed" x5 rounds for increased endurance and tolerance to speed change  STS with fast stand, slow sit 3 x 8 with cueing for difference between speeds with good carry over after initial set   Standing cone taps alternating legs and tapping the cone color randomly called out to challenge cognition 2x15 - CGA with minimal LOB when lifting LLE    Lateral stepping with cueing to hold head up and focus on  to prevent dizziness (difficulty with changing environment of cars at window) 2x 20 (10R 10L)   Walking with speed changes over 210f, changes called out at random, CGA for safety - good speed changes demonstrated   Walking with 360 degree turns alternating between forward and backward walking - about 100 feet - patient did report slight dizziness with this exercise   Vitals remained normal throughout session, Education on breath control and energy conservation as it relates to PKD with good understanding      PT Education - 03/05/21 0958    Education provided Yes    Education Details threex form/technique, deep breathing    Person(s) Educated Patient    Methods Explanation;Demonstration;Verbal cues    Comprehension Verbalized understanding;Returned demonstration;Verbal cues required            PT Short Term Goals - 01/14/21 1029      PT SHORT TERM GOAL #1   Title Patient will demonstrate complaince with HEP to speed recovery and reduce  total number of visits.    Baseline HEP given; 01/14/2021: Independent with HEP    Time 2    Period Weeks    Status Achieved    Target Date 09/27/19             PT Long Term Goals - 01/13/21 1328      PT LONG TERM GOAL #1   Title Patient will improve hip strength to 4/5 grossly to facilitate improved mobility with ADLs.    Baseline 3, 4-; 12/22 deferred to NV; 11/09/2019: 4-/5; 12/07/2019 L hip flexor 4, abd and ext 3+; 01/29/2020: 4/5 for most hip motions, 3+/5 for abd, ext; 03/20/2020: 4/5 for hip movements    Time 8    Period Weeks    Status Achieved      PT LONG TERM GOAL #2   Title Patient will demonstrate walking tolerance increased to 30 minutes without increase  in symptoms to demonstrate improved ability to perform walking program for cardiovascular health.    Baseline 10 minute tolerance (patient report); 12/22 10 minutes (per pt report); 11/09/2019: 7 min; 12/07/2019 unable to assess due to icy streets;  01/29/2020: 10 min; 03/20/2020 10 min; 05/14/2020: 10 min x 3 per day; 06/11/2020: 8mn x 3 per day; 07/15/2020: 180m x 3 per day; 09/02/2020: 1048mx 3 (just standing rest breaks in between); 11/06/2019: 10 min x 3    Time 8    Period Weeks    Status Partially Met      PT LONG TERM GOAL #3   Title Patient will report worst pain of 3/10 during therapy to demonstrate reduced disability    Baseline Worst 7/10; 12/22 worst 9/10; 11/09/2019: 9/10; 12/07/2019 7/10; 01/29/2020: 7/10; 05/14/2020: 0/ 10    Time 8    Period Weeks    Status Achieved      PT LONG TERM GOAL #4   Title Patient will demonstrate ability to perform all bed mobility maneuvers without increase in pain for reduced disability with mobility and transfers with ADLs.    Baseline Reports pain with bed mobility supine <> sidelying L/R, sidelying to sitting EOB; 12/22 pt able to perform I without increased pain    Time 8    Period Weeks    Status Achieved      PT LONG TERM GOAL #5   Title Patient will improve 10MW speed to 1.0  m/sec to meet norms for minimal fall risk and full community ambulation.    Baseline 0.88 m/sec; 12/22 0.96 m/sec; 11/09/2019: deferred; 12/07/2019 0.90 m/sec; 01/29/2020: .9  m/sec; 03/20/2020: .86m/40m7/13/2021: . 9 m/s; 06/11/2020: .86m/s48m/13/2021: 1 m/s    Time 8    Period Weeks    Status Achieved      Additional Long Term Goals   Additional Long Term Goals Yes      PT LONG TERM GOAL #6   Title Patient will increase 6MWT by 200 ft to demonstrate improvement in functional capacity for transition to cardiac walking program    Baseline 6MWT = 1195; 1/7/20201: deferred; 12/07/2019 1095; 01/29/2020: deferred secondary to fatigue; 03/20/2020: 1070ft;56f3/2021: 1130ft; 75f/2021: 800ft; 16f022: 973ft; 3/41f022: 1000ft    [id number]    Period Weeks    Status On-going      PT LONG TERM GOAL #7   Title Patient will improve her 5xsts to under 15 sec to indicate singificnat improvement in LE functional strength and decrease in fall risk.    Baseline 24.5 sec; 03/20/2020: 19.19 sec; 06/13/2020: 14. 92    Time 6    Period Weeks    Status Achieved      PT LONG TERM GOAL #8   Title Patient will improve FGA to 28/30 to indicate improvement in dynamic balance and improvement in stability.    Baseline 23/30; 06/11/2020: 25/30; 07/15/2020: 26/30; 09/02/2020: 27/30; 11/05/2020: 28/30    Time 6    Period Weeks    Status Achieved      PT LONG TERM GOAL  #9   TITLE Patient will be able to ascend/desend the stairs without use of UEs to increase safety and decrease fall risk with stair use.    Baseline requires use of UEs; 01/13/2021: able to perform with CGA    Time 6    Period Weeks    Status Achieved      PT LONG TERM GOAL  #10   TITLE Patient will improve her  TUG scores to under 12 sec for normal tug; 15 sec for cognitive TUG to decrease fall risk overall    Baseline normal: 17.2; 27.3 sec    Time 6    Period Weeks    Status New                 Plan - 03/05/21 1033    Clinical Impression Statement  PT continued neuromuscular training and included activites to challenge the vestibular system to improve dynamic balance. Patient has slight difficulty with changing gait speeds but is showing improvement. Patient required frequent rest breaks to catch her breath but vitals remained WNL throughout all exercises. Patient tolerated all exercise with minimal cueing and good carryover after initial demonstration. PT will continue to progress exercises to patient tolerance.    Personal Factors and Comorbidities Age;Comorbidity 3+;Time since onset of injury/illness/exacerbation;Comorbidity 2;Other    Comorbidities MCI, HTN, CA, cataracts    Examination-Activity Limitations Bend;Sit;Squat;Stairs;Stand;Sleep    Examination-Participation Restrictions Cleaning;Community Activity    Stability/Clinical Decision Making Evolving/Moderate complexity    Clinical Decision Making Moderate    Rehab Potential Fair    Clinical Impairments Affecting Rehab Potential MCI    PT Frequency 2x / week    PT Duration 6 weeks    PT Treatment/Interventions ADLs/Self Care Home Management;Therapeutic exercise;Patient/family education;Neuromuscular re-education;Therapeutic activities;Functional mobility training;Balance training;Manual techniques;Gait training;Stair training;Moist Heat;Electrical Stimulation;Cryotherapy;Taping;Dry needling;Energy conservation;Passive range of motion;Joint Manipulations    PT Next Visit Plan Progress glute therex, pelvic-hip dissociation; check and progress HEP with stretches    PT Home Exercise Plan lateral step ups, step ups, hip kickers ext/abd red theraband    Consulted and Agree with Plan of Care Patient           Patient will benefit from skilled therapeutic intervention in order to improve the following deficits and impairments:  Decreased range of motion,Postural dysfunction,Pain,Abnormal gait,Decreased activity tolerance,Decreased coordination,Decreased endurance,Decreased  balance,Decreased mobility,Difficulty walking,Hypomobility,Increased muscle spasms,Impaired perceived functional ability,Decreased strength,Impaired flexibility  Visit Diagnosis: Difficulty in walking, not elsewhere classified  Muscle weakness (generalized)     Problem List Patient Active Problem List   Diagnosis Date Noted  . Osteopenia of neck of right femur 09/06/2020  . Obesity 08/29/2020  . Parkinson disease ([place]) 02/22/2020  . 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 Bipolar disorder, in full remission, most recent episode depressed ([place]) 11/08/2019  . Avitaminosis D 08/24/2019  . Insomnia due to mental condition 05/31/2019  . Bipolar I disorder, most recent episode depressed ([place]) 05/31/2019  . Cognitive disorder 05/31/2019  . Alcohol use disorder, moderate, in sustained remission ([place]) 05/31/2019  . Elevated tumor markers 05/27/2019  . GAD (generalized anxiety disorder) 04/10/2019  . MDD (major depressive disorder), severe ([place]) 01/31/2019  . High serum carbohydrate antigen 19-9 (CA19-9) 12/01/2018  . Adjustment disorder with anxious mood 11/24/2018  . Essential hypertension 11/24/2018  . B12 deficiency 10/17/2018  . Incisional hernia, without obstruction or gangrene 09/12/2018  . Genetic testing 04/07/2018  . Malignant neoplasm of upper-outer quadrant of right breast in female, estrogen receptor positive ([place]) 03/17/2018  . Malignant neoplasm of upper-inner quadrant of right breast in female, estrogen receptor positive ([place]) 03/17/2018  . History of psychosis 02/07/2018  . History of insomnia 02/07/2018    Durwin Reges DPT [address] SPT  Durwin Reges 03/06/2021, 9:58 AM  [facility] [address], [place], [address] Phone: [phone]   Fax:  [phone]  Name: Quisha Mabie MRN: [id number] Date of Birth: 1946/05/19

## 2021-03-10 ENCOUNTER — Ambulatory Visit: Payer: Medicare PPO | Admitting: Licensed Clinical Social Worker

## 2021-03-11 ENCOUNTER — Encounter: Payer: Medicare PPO | Admitting: Physical Therapy

## 2021-03-12 ENCOUNTER — Encounter: Payer: Medicare PPO | Admitting: Physical Therapy

## 2021-03-12 ENCOUNTER — Other Ambulatory Visit: Payer: Self-pay

## 2021-03-12 ENCOUNTER — Ambulatory Visit: Payer: Medicare PPO

## 2021-03-12 DIAGNOSIS — M25551 Pain in right hip: Secondary | ICD-10-CM | POA: Diagnosis not present

## 2021-03-12 DIAGNOSIS — M25552 Pain in left hip: Secondary | ICD-10-CM

## 2021-03-12 DIAGNOSIS — M6281 Muscle weakness (generalized): Secondary | ICD-10-CM

## 2021-03-12 DIAGNOSIS — R262 Difficulty in walking, not elsewhere classified: Secondary | ICD-10-CM

## 2021-03-12 DIAGNOSIS — M25622 Stiffness of left elbow, not elsewhere classified: Secondary | ICD-10-CM | POA: Diagnosis not present

## 2021-03-12 DIAGNOSIS — M25522 Pain in left elbow: Secondary | ICD-10-CM | POA: Diagnosis not present

## 2021-03-12 NOTE — Therapy (Signed)
[facility] [address] [place], [address] Phone: [phone]   Fax:  [phone]  Physical Therapy Treatment/Reassessment Physical Therapy Progress Note   Dates of reporting period  01/13/21   to   03/12/21   Patient Details  Name: Kendra [name] MRN: [id number] Date of Birth: 1945-11-29 No data recorded  Encounter Date: 03/12/2021   PT End of Session - 03/12/21 0958    Visit Number 100    Number of Visits 100    Date for PT Re-Evaluation 04/02/21    Authorization Type [employer]: Approved for 24 visits from 01/01/21-04/01/21    Authorization Time Period Cert [id number]    Authorization - Visit Number 14    Authorization - Number of Visits 24    PT Start Time 0946    PT Stop Time 1026    PT Time Calculation (min) 40 min    Activity Tolerance Patient tolerated treatment well;No increased pain    Behavior During Therapy WFL for tasks assessed/performed           Past Medical History:  Diagnosis Date  . Anxiety   . Breast cancer ([place]) 03/2018   right breast cancer at 11:00 and 1:00  . Bursitis    bilateral hips and knees  . Complication of anesthesia   . Depression   . Family history of breast cancer 03/24/2018  . Family history of lung cancer 03/24/2018  . History of alcohol dependence ([place]) 2007   no ETOH; resolved since 2007  . History of hiatal hernia   . History of psychosis 2014   due to sleep disturbance  . Hypertension   . Parkinson disease ([place])   . Personal history of radiation therapy 06/2018-07/2018   right breast ca  . PONV (postoperative nausea and vomiting)     Past Surgical History:  Procedure Laterality Date  . BREAST BIOPSY Right 03/14/2018   11:00 DCIS and invasive ductal carcinoma  . BREAST BIOPSY Right 03/14/2018   1:00 Invasive ductal carcinoma  . BREAST LUMPECTOMY Right 04/27/2018   lumpectomy of 11 and 1:00 cancers, clear margins, negative LN  . BREAST  LUMPECTOMY WITH RADIOACTIVE SEED AND SENTINEL LYMPH NODE BIOPSY Right 04/27/2018   Procedure: RIGHT BREAST LUMPECTOMY WITH RADIOACTIVE SEED X 2 AND RIGHT SENTINEL LYMPH NODE BIOPSY;  Surgeon: [name], MD;  Location: [facility];  Service: General;  Laterality: Right;  . CATARACT EXTRACTION W/PHACO Left 08/30/2019   Procedure: CATARACT EXTRACTION PHACO AND INTRAOCULAR LENS PLACEMENT ([place]) LEFT panoptix toric  01:20.5  12.7%  10.26;  Surgeon: Leandrew Koyanagi, MD;  Location: [facility];  Service: Ophthalmology;  Laterality: Left;  . CATARACT EXTRACTION W/PHACO Right 09/20/2019   Procedure: CATARACT EXTRACTION PHACO AND INTRAOCULAR LENS PLACEMENT ([facility]) RIGHT 5.71  00:36.4  15.7%;  Surgeon: Leandrew Koyanagi, MD;  Location: [facility];  Service: Ophthalmology;  Laterality: Right;  . LAPAROTOMY N/A 02/10/2018   Procedure: EXPLORATORY LAPAROTOMY;  Surgeon: Isabel Caprice, MD;  Location: [facility];  Service: Gynecology;  Laterality: N/A;  . MASS EXCISION  01/2018   abdominal  . OVARIAN CYST REMOVAL    . SALPINGOOPHORECTOMY Bilateral 02/10/2018   Procedure: BILATERAL SALPINGO OOPHORECTOMY; PERITONEAL WASHINGS;  Surgeon: Isabel Caprice, MD;  Location: [facility];  Service: Gynecology;  Laterality: Bilateral;  . TONSILLECTOMY    . TONSILLECTOMY AND ADENOIDECTOMY  1952    There were no vitals filed for this visit.   Subjective Assessment -  03/12/21 0955    Subjective Pt doing well today. She is a little foggy. Pt denies any recent medical changes or falls. She reports continued limitation in AMB to 5 minutes due ot fatigue.    Pertinent History Patient report unclear regarding onset. Visited MD on Sept 16 and received injection with relief for approximately 1 month. Reports she cannot receive an additional injection pending eye surgery. Received dx from [facility] of hip bursitis in B hips. Reports pain with walking, stanindg, sitting 5/10 current 7/10 worst 3/10 best,  with temporary relief when she is sitting "just right" and with naproxen. Agg: activity. Cormorbidities: hx of breast cancer, HTN, MCI, insomnia, cataracts    Currently in Pain? No/denies              [facility] PT Assessment - 03/12/21 0001      Observation/Other Assessments   Focus on Therapeutic Outcomes (FOTO)  Not a FOTO patient      Transfers   Five time sit to stand comments  12.91sec hands free      Ambulation/Gait   Assistive device None    Gait Comments 6MWT performance      6 Minute walk- Post Test   6 Minute Walk Post Test yes    BP (mmHg) 147/68    HR (bpm) 110    Modified Borg Scale for Dyspnea --   2 (0-4 scale)     6 minute walk test results    Aerobic Endurance Distance Walked 975    Endurance additional comments acute onset dizziness at 5 minutes, stops for 15 seocnds      Standardized Balance Assessment   Standardized Balance Assessment Timed Up and Go Test;10 meter walk test;Five Times Sit to Stand    Five times sit to stand comments  12.91sec hands free, gray chair    10 Meter Walk 0.54m/s   self selected gait speed     Timed Up and Go Test   Normal TUG (seconds) 12.22    Cognitive TUG (seconds) --   not assessed this date                  PT Short Term Goals - 01/14/21 1029      PT SHORT TERM GOAL #1   Title Patient will demonstrate complaince with HEP to speed recovery and reduce total number of visits.    Baseline HEP given; 01/14/2021: Independent with HEP    Time 2    Period Weeks    Status Achieved    Target Date 09/27/19             PT Long Term Goals - 03/12/21 1009      PT LONG TERM GOAL #1   Title Patient will improve hip strength to 4/5 grossly to facilitate improved mobility with ADLs.    Baseline 3, 4-; 12/22 deferred to NV; 11/09/2019: 4-/5; 12/07/2019 L hip flexor 4, abd and ext 3+; 01/29/2020: 4/5 for most hip motions, 3+/5 for abd, ext; 03/20/2020: 4/5 for hip movements    Time 8    Period Weeks    Status Achieved       PT LONG TERM GOAL #2   Title Patient will demonstrate walking tolerance increased to 30 minutes without increase in symptoms to demonstrate improved ability to perform walking program for cardiovascular health.    Baseline 10 minute tolerance (patient report); 12/22 10 minutes (per pt report); 11/09/2019: 7 min; 12/07/2019 unable to assess due to icy streets;  01/29/2020:  10 min; 03/20/2020 10 min; 05/14/2020: 10 min x 3 per day; 06/11/2020: 15min x 3 per day; 07/15/2020: 7min x 3 per day; 09/02/2020: 47min x 3 (just standing rest breaks in between); 11/06/2019: 10 min x 3    Time 8    Period Weeks    Status On-going      PT LONG TERM GOAL #3   Title Patient will report worst pain of 3/10 during therapy to demonstrate reduced disability    Baseline Worst 7/10; 12/22 worst 9/10; 11/09/2019: 9/10; 12/07/2019 7/10; 01/29/2020: 7/10; 05/14/2020: 0/10; 03/12/21: 5/10    Time 8    Period Weeks    Status On-going      PT LONG TERM GOAL #4   Title Patient will demonstrate ability to perform all bed mobility maneuvers without increase in pain for reduced disability with mobility and transfers with ADLs.    Baseline Reports pain with bed mobility supine <> sidelying L/R, sidelying to sitting EOB; 12/22 pt able to perform I without increased pain    Time 8    Period Weeks    Status Achieved      PT LONG TERM GOAL #5   Title Patient will improve 10MW speed to 1.0 m/sec to meet norms for minimal fall risk and full community ambulation.    Baseline 0.88 m/sec; 12/22 0.96 m/sec; 11/09/2019: deferred; 12/07/2019 0.90 m/sec; 01/29/2020: .9  m/sec; 03/20/2020: .69m/s; 05/14/2020: . 9 m/s; 06/11/2020: .29m/s; 07/15/2020: 1 m/s; 03/12/21:0.48m/s    Time 8    Period Weeks    Status On-going    Target Date 01/18/20      PT LONG TERM GOAL #6   Title Patient will increase 6MWT by 200 ft to demonstrate improvement in functional capacity for transition to cardiac walking program    Baseline 6MWT = 1195; 1/7/20201: deferred; 12/07/2019  1095; 01/29/2020: deferred secondary to fatigue; 03/20/2020: 1067ft; 07/15/2020: 1149ft; 09/03/2020: 853ft; 11/05/2020: 961ft; 01/13/2021: 107ft    Time 8    Period Weeks    Status On-going      PT LONG TERM GOAL #7   Title Patient will improve her 5xsts to under 15 sec to indicate singificnat improvement in LE functional strength and decrease in fall risk.    Baseline 24.5 sec; 03/20/2020: 19.19 sec; 06/13/2020: 14.92; 03/12/21: 12.91sec    Time 6    Period Weeks    Status Achieved      PT LONG TERM GOAL #8   Title Patient will improve FGA to 28/30 to indicate improvement in dynamic balance and improvement in stability.    Baseline 23/30; 06/11/2020: 25/30; 07/15/2020: 26/30; 09/02/2020: 27/30; 11/05/2020: 28/30    Time 6    Period Weeks    Status Achieved      PT LONG TERM GOAL  #9   TITLE Patient will be able to ascend/desend the stairs without use of UEs to increase safety and decrease fall risk with stair use.    Baseline requires use of UEs; 01/13/2021: able to perform with CGA; 03/12/21: able to perform 4 steps/up and down hands free supervision level   fluctuating due to dizziness fluctuation   Time 6    Period Weeks    Status On-going      PT LONG TERM GOAL  #10   TITLE Patient will improve her TUG scores to under 12 sec for normal tug; 15 sec for cognitive TUG to decrease fall risk overall    Baseline 01/13/21: 17.2 Regular ; 27.3 sec cognitive; 5/11  12.22sec regular (cognitive not assessed)    Time 6    Period Weeks    Status On-going                 Plan - 03/12/21 1023    Clinical Impression Statement 10th visit reassessmen tthis date. Noted big improvements on TUG and 5x STS, gradual improvement in 6MWT which is limited by dizziness episode 5 minutes in. Many other goals also reassessed and show either no change or slight decline in performance. No HEP updates made this session. Goals need to be addressed and updated next visit. A plan for DC from PT services should be  discussed at upcoming visits. Session remains moderately limited by intermittent dizziness with upright activity, however vitals monitoring throughout session does not correlate any frank orthostasis to symptoms. Pt will continue to benefit from skilled PT intervention to maximize activity tolerance, independence, and safety in performance of ADL, IADL, and leisure activity.    Personal Factors and Comorbidities Age;Comorbidity 3+;Time since onset of injury/illness/exacerbation;Comorbidity 2;Other    Comorbidities MCI, HTN, CA, cataracts    Examination-Activity Limitations Bend;Sit;Squat;Stairs;Stand;Sleep    Examination-Participation Restrictions Cleaning;Community Activity    Stability/Clinical Decision Making Stable/Uncomplicated    Clinical Decision Making Moderate    Rehab Potential Fair    Clinical Impairments Affecting Rehab Potential MCI    PT Frequency 2x / week    PT Duration 6 weeks    PT Treatment/Interventions ADLs/Self Care Home Management;Therapeutic exercise;Patient/family education;Neuromuscular re-education;Therapeutic activities;Functional mobility training;Balance training;Manual techniques;Gait training;Stair training;Moist Heat;Electrical Stimulation;Cryotherapy;Taping;Dry needling;Energy conservation;Passive range of motion;Joint Manipulations    PT Next Visit Plan Discuss plan for DC, update HEP as needed; visit approval ends after 5/31    PT Home Exercise Plan no updates this visit    Consulted and Agree with Plan of Care Patient           Patient will benefit from skilled therapeutic intervention in order to improve the following deficits and impairments:  Decreased range of motion,Postural dysfunction,Pain,Abnormal gait,Decreased activity tolerance,Decreased coordination,Decreased endurance,Decreased balance,Decreased mobility,Difficulty walking,Hypomobility,Increased muscle spasms,Impaired perceived functional ability,Decreased strength,Impaired flexibility  Visit  Diagnosis: Difficulty in walking, not elsewhere classified  Muscle weakness (generalized)  Pain in right hip  Pain in left elbow  Pain in left hip  Stiffness of left elbow, not elsewhere classified     Problem List Patient Active Problem List   Diagnosis Date Noted  . Osteopenia of neck of right femur 09/06/2020  . Obesity 08/29/2020  . Parkinson disease ([place]) 02/22/2020  . 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 Bipolar disorder, in full remission, most recent episode depressed ([place]) 11/08/2019  . Avitaminosis D 08/24/2019  . Insomnia due to mental condition 05/31/2019  . Bipolar I disorder, most recent episode depressed ([place]) 05/31/2019  . Cognitive disorder 05/31/2019  . Alcohol use disorder, moderate, in sustained remission ([place]) 05/31/2019  . Elevated tumor markers 05/27/2019  . GAD (generalized anxiety disorder) 04/10/2019  . MDD (major depressive disorder), severe ([place]) 01/31/2019  . High serum carbohydrate antigen 19-9 (CA19-9) 12/01/2018  . Adjustment disorder with anxious mood 11/24/2018  . Essential hypertension 11/24/2018  . B12 deficiency 10/17/2018  . Incisional hernia, without obstruction or gangrene 09/12/2018  . Genetic testing 04/07/2018  . Malignant neoplasm of upper-outer quadrant of right breast in female, estrogen receptor positive ([place]) 03/17/2018  . Malignant neoplasm of upper-inner quadrant of right breast in female, estrogen receptor positive ([place]) 03/17/2018  . History of psychosis 02/07/2018  . History of insomnia 02/07/2018   11:13 AM,  03/12/21 Etta Grandchild, PT, DPT Physical Therapist - [place] [phone] (Office)    Nhi Butrum C 03/12/2021, 11:08 AM  [facility] [address] [place], [address] Phone: [phone]   Fax:  [phone]  Name: Kendra [name] MRN: [id number] Date of Birth: 1946-09-30

## 2021-03-18 ENCOUNTER — Encounter: Payer: Medicare PPO | Admitting: Physical Therapy

## 2021-03-19 ENCOUNTER — Other Ambulatory Visit: Payer: Self-pay

## 2021-03-19 ENCOUNTER — Ambulatory Visit: Payer: Medicare PPO | Admitting: Physical Therapy

## 2021-03-19 DIAGNOSIS — M6281 Muscle weakness (generalized): Secondary | ICD-10-CM

## 2021-03-19 DIAGNOSIS — M25622 Stiffness of left elbow, not elsewhere classified: Secondary | ICD-10-CM | POA: Diagnosis not present

## 2021-03-19 DIAGNOSIS — M25552 Pain in left hip: Secondary | ICD-10-CM | POA: Diagnosis not present

## 2021-03-19 DIAGNOSIS — R262 Difficulty in walking, not elsewhere classified: Secondary | ICD-10-CM

## 2021-03-19 DIAGNOSIS — M25522 Pain in left elbow: Secondary | ICD-10-CM | POA: Diagnosis not present

## 2021-03-19 DIAGNOSIS — M25551 Pain in right hip: Secondary | ICD-10-CM | POA: Diagnosis not present

## 2021-03-19 NOTE — Therapy (Signed)
[facility] [address] [place], [address] Phone: [phone]   Fax:  [phone]  Physical Therapy Treatment  Patient Details  Name: Kendra [name] MRN: [id number] Date of Birth: November 10, 1945 No data recorded  Encounter Date: 03/19/2021   PT End of Session - 03/19/21 1414    Visit Number 101    Number of Visits 100    Date for PT Re-Evaluation 04/02/21    Authorization Type [employer]: Approved for 24 visits from 01/01/21-04/01/21    Authorization Time Period Cert 5/95/63-8/75/64    Authorization - Visit Number 15    Authorization - Number of Visits 24    PT Start Time 3329    PT Stop Time 1425    PT Time Calculation (min) 40 min    Equipment Utilized During Treatment Gait belt    Activity Tolerance Patient tolerated treatment well;No increased pain    Behavior During Therapy WFL for tasks assessed/performed           Past Medical History:  Diagnosis Date  . Anxiety   . Breast cancer ([place]) 03/2018   right breast cancer at 11:00 and 1:00  . Bursitis    bilateral hips and knees  . Complication of anesthesia   . Depression   . Family history of breast cancer 03/24/2018  . Family history of lung cancer 03/24/2018  . History of alcohol dependence ([place]) 2007   no ETOH; resolved since 2007  . History of hiatal hernia   . History of psychosis 2014   due to sleep disturbance  . Hypertension   . Parkinson disease ([place])   . Personal history of radiation therapy 06/2018-07/2018   right breast ca  . PONV (postoperative nausea and vomiting)     Past Surgical History:  Procedure Laterality Date  . BREAST BIOPSY Right 03/14/2018   11:00 DCIS and invasive ductal carcinoma  . BREAST BIOPSY Right 03/14/2018   1:00 Invasive ductal carcinoma  . BREAST LUMPECTOMY Right 04/27/2018   lumpectomy of 11 and 1:00 cancers, clear margins, negative LN  . BREAST LUMPECTOMY WITH RADIOACTIVE SEED AND SENTINEL LYMPH  NODE BIOPSY Right 04/27/2018   Procedure: RIGHT BREAST LUMPECTOMY WITH RADIOACTIVE SEED X 2 AND RIGHT SENTINEL LYMPH NODE BIOPSY;  Surgeon: [name], MD;  Location: [facility];  Service: General;  Laterality: Right;  . CATARACT EXTRACTION W/PHACO Left 08/30/2019   Procedure: CATARACT EXTRACTION PHACO AND INTRAOCULAR LENS PLACEMENT ([place]) LEFT panoptix toric  01:20.5  12.7%  10.26;  Surgeon: Leandrew Koyanagi, MD;  Location: [facility];  Service: Ophthalmology;  Laterality: Left;  . CATARACT EXTRACTION W/PHACO Right 09/20/2019   Procedure: CATARACT EXTRACTION PHACO AND INTRAOCULAR LENS PLACEMENT ([facility]) RIGHT 5.71  00:36.4  15.7%;  Surgeon: Leandrew Koyanagi, MD;  Location: [facility];  Service: Ophthalmology;  Laterality: Right;  . LAPAROTOMY N/A 02/10/2018   Procedure: EXPLORATORY LAPAROTOMY;  Surgeon: Isabel Caprice, MD;  Location: [facility];  Service: Gynecology;  Laterality: N/A;  . MASS EXCISION  01/2018   abdominal  . OVARIAN CYST REMOVAL    . SALPINGOOPHORECTOMY Bilateral 02/10/2018   Procedure: BILATERAL SALPINGO OOPHORECTOMY; PERITONEAL WASHINGS;  Surgeon: Isabel Caprice, MD;  Location: [facility];  Service: Gynecology;  Laterality: Bilateral;  . TONSILLECTOMY    . TONSILLECTOMY AND ADENOIDECTOMY  1953    There were no vitals filed for this visit.    Neuromuscular rehab  Nustep 5 mins with 50sec "normal speed" 10sec "fast  as possible speed" x5 rounds for increased endurance and tolerance to speed change  13f with conversation cognitive task CGA for safety 1027fwith large "stomp" steps; 10053fith these + BIG arm swings with consistent cuing for "BIG"  Requires seated rest due to feeling of SOB; vitals checked O2 96% HR 108bpm  250f74fth random speed changes CGA for safety, cuing for increased difference between fast and slow speed Requires seated rest due to feeling of SOB; vitals checked WNL  STS with overhead press of 2# DB with  fast stand, slow sit 3 x 5 with cueing for simultaneous press  FWD <> BWD LE swings x12 bilat with cuing for BIG swing with good carry over CGA for safety              PT Education - 03/19/21 1414    Education Details therex form/technique    Person(s) Educated Patient    Methods Explanation;Demonstration;Verbal cues    Comprehension Returned demonstration;Verbalized understanding;Verbal cues required            PT Short Term Goals - 01/14/21 1029      PT SHORT TERM GOAL #1   Title Patient will demonstrate complaince with HEP to speed recovery and reduce total number of visits.    Baseline HEP given; 01/14/2021: Independent with HEP    Time 2    Period Weeks    Status Achieved    Target Date 09/27/19             PT Long Term Goals - 03/12/21 1009      PT LONG TERM GOAL #1   Title Patient will improve hip strength to 4/5 grossly to facilitate improved mobility with ADLs.    Baseline 3, 4-; 12/22 deferred to NV; 11/09/2019: 4-/5; 12/07/2019 L hip flexor 4, abd and ext 3+; 01/29/2020: 4/5 for most hip motions, 3+/5 for abd, ext; 03/20/2020: 4/5 for hip movements    Time 8    Period Weeks    Status Achieved      PT LONG TERM GOAL #2   Title Patient will demonstrate walking tolerance increased to 30 minutes without increase in symptoms to demonstrate improved ability to perform walking program for cardiovascular health.    Baseline 10 minute tolerance (patient report); 12/22 10 minutes (per pt report); 11/09/2019: 7 min; 12/07/2019 unable to assess due to icy streets;  01/29/2020: 10 min; 03/20/2020 10 min; 05/14/2020: 10 min x 3 per day; 06/11/2020: 10mi54m3 per day; 07/15/2020: 10min3m per day; 09/02/2020: 10min 66m(just standing rest breaks in between); 11/06/2019: 10 min x 3    Time 8    Period Weeks    Status On-going      PT LONG TERM GOAL #3   Title Patient will report worst pain of 3/10 during therapy to demonstrate reduced disability    Baseline Worst 7/10; 12/22 worst  9/10; 11/09/2019: 9/10; 12/07/2019 7/10; 01/29/2020: 7/10; 05/14/2020: 0/10; 03/12/21: 5/10    Time 8    Period Weeks    Status On-going      PT LONG TERM GOAL #4   Title Patient will demonstrate ability to perform all bed mobility maneuvers without increase in pain for reduced disability with mobility and transfers with ADLs.    Baseline Reports pain with bed mobility supine <> sidelying L/R, sidelying to sitting EOB; 12/22 pt able to perform I without increased pain    Time 8    Period Weeks  Status Achieved      PT LONG TERM GOAL #5   Title Patient will improve 10MW speed to 1.0 m/sec to meet norms for minimal fall risk and full community ambulation.    Baseline 0.88 m/sec; 12/22 0.96 m/sec; 11/09/2019: deferred; 12/07/2019 0.90 m/sec; 01/29/2020: .9  m/sec; 03/20/2020: .35ms; 05/14/2020: . 9 m/s; 06/11/2020: .957m; 07/15/2020: 1 m/s; 03/12/21:0.8931m   Time 8    Period Weeks    Status On-going    Target Date 01/18/20      PT LONG TERM GOAL #6   Title Patient will increase 6MWT by 200 ft to demonstrate improvement in functional capacity for transition to cardiac walking program    Baseline 6MWT = 1195; 1/7/20201: deferred; 12/07/2019 1095; 01/29/2020: deferred secondary to fatigue; 03/20/2020: 1070f83f/13/2021: 1130ft74f/12/2019: 800ft;86f/2022: 973ft; 50f/2022: 1000ft   4fe 8    Period Weeks    Status On-going      PT LONG TERM GOAL #7   Title Patient will improve her 5xsts to under 15 sec to indicate singificnat improvement in LE functional strength and decrease in fall risk.    Baseline 24.5 sec; 03/20/2020: 19.19 sec; 06/13/2020: 14.92; 03/12/21: 12.91sec    Time 6    Period Weeks    Status Achieved      PT LONG TERM GOAL #8   Title Patient will improve FGA to 28/30 to indicate improvement in dynamic balance and improvement in stability.    Baseline 23/30; 06/11/2020: 25/30; 07/15/2020: 26/30; 09/02/2020: 27/30; 11/05/2020: 28/30    Time 6    Period Weeks    Status Achieved      PT LONG  TERM GOAL  #9   TITLE Patient will be able to ascend/desend the stairs without use of UEs to increase safety and decrease fall risk with stair use.    Baseline requires use of UEs; 01/13/2021: able to perform with CGA; 03/12/21: able to perform 4 steps/up and down hands free supervision level   fluctuating due to dizziness fluctuation   Time 6    Period Weeks    Status On-going      PT LONG TERM GOAL  #10   TITLE Patient will improve her TUG scores to under 12 sec for normal tug; 15 sec for cognitive TUG to decrease fall risk overall    Baseline 01/13/21: 17.2 Regular ; 27.3 sec cognitive; 5/11 12.22sec regular (cognitive not assessed)    Time 6    Period Weeks    Status On-going                 Plan - 03/19/21 1415    Clinical Impression Statement PT continued therex progression for increased safety with dynamic balance with various tasking, and BLE strength and power needed for balance recovery and ind transfers. PT spent time educating patient on PKD limitations and programs to address these- as reassessment demonstrated strong gains in decreased fall risk and increased strength- with remaining deficits in festination, sensation of SOB, decreased voice volume/articulation. PT educated patient on [facility] program at the hospital, and the benefit of a PKD program at this time, as she has met many OPPT goals; pt is agreeable. PT will follow up about this program and d/c PT from this clinic accordingly. Pt will benefit from services in the interium to continue progression, and prevent fall risk and functional decline.    Personal Factors and Comorbidities Age;Comorbidity 3+;Time since onset of injury/illness/exacerbation;Comorbidity 2;Other    Comorbidities MCI,  HTN, CA, cataracts    Examination-Activity Limitations Bend;Sit;Squat;Stairs;Stand;Sleep    Examination-Participation Restrictions Cleaning;Community Activity    Stability/Clinical Decision Making Stable/Uncomplicated     Clinical Decision Making Moderate    Rehab Potential Fair    Clinical Impairments Affecting Rehab Potential MCI    PT Frequency 2x / week    PT Duration 6 weeks    PT Treatment/Interventions ADLs/Self Care Home Management;Therapeutic exercise;Patient/family education;Neuromuscular re-education;Therapeutic activities;Functional mobility training;Balance training;Manual techniques;Gait training;Stair training;Moist Heat;Electrical Stimulation;Cryotherapy;Taping;Dry needling;Energy conservation;Passive range of motion;Joint Manipulations    PT Next Visit Plan Discuss plan for DC, update HEP as needed; visit approval ends after 5/31    PT Home Exercise Plan no updates this visit    Consulted and Agree with Plan of Care Patient           Patient will benefit from skilled therapeutic intervention in order to improve the following deficits and impairments:  Decreased range of motion,Postural dysfunction,Pain,Abnormal gait,Decreased activity tolerance,Decreased coordination,Decreased endurance,Decreased balance,Decreased mobility,Difficulty walking,Hypomobility,Increased muscle spasms,Impaired perceived functional ability,Decreased strength,Impaired flexibility  Visit Diagnosis: No diagnosis found.     Problem List Patient Active Problem List   Diagnosis Date Noted  . Osteopenia of neck of right femur 09/06/2020  . Obesity 08/29/2020  . Parkinson disease ([place]) 02/22/2020  . 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 Bipolar disorder, in full remission, most recent episode depressed ([place]) 11/08/2019  . Avitaminosis D 08/24/2019  . Insomnia due to mental condition 05/31/2019  . Bipolar I disorder, most recent episode depressed ([place]) 05/31/2019  . Cognitive disorder 05/31/2019  . Alcohol use disorder, moderate, in sustained remission ([place]) 05/31/2019  . Elevated tumor markers 05/27/2019  . GAD (generalized anxiety disorder) 04/10/2019  . MDD (major depressive disorder), severe ([place]) 01/31/2019  .  High serum carbohydrate antigen 19-9 (CA19-9) 12/01/2018  . Adjustment disorder with anxious mood 11/24/2018  . Essential hypertension 11/24/2018  . B12 deficiency 10/17/2018  . Incisional hernia, without obstruction or gangrene 09/12/2018  . Genetic testing 04/07/2018  . Malignant neoplasm of upper-outer quadrant of right breast in female, estrogen receptor positive ([place]) 03/17/2018  . Malignant neoplasm of upper-inner quadrant of right breast in female, estrogen receptor positive ([place]) 03/17/2018  . History of psychosis 02/07/2018  . History of insomnia 02/07/2018   Durwin Reges DPT Durwin Reges 03/19/2021, 2:26 PM  [facility] [address] [place], [address] Phone: [phone]   Fax:  [phone]  Name: Tishie Altmann MRN: [id number] Date of Birth: 1946-10-29

## 2021-03-25 ENCOUNTER — Ambulatory Visit (INDEPENDENT_AMBULATORY_CARE_PROVIDER_SITE_OTHER): Payer: Medicare PPO | Admitting: Licensed Clinical Social Worker

## 2021-03-25 ENCOUNTER — Telehealth (INDEPENDENT_AMBULATORY_CARE_PROVIDER_SITE_OTHER): Payer: Medicare PPO | Admitting: Psychiatry

## 2021-03-25 ENCOUNTER — Encounter: Payer: Medicare PPO | Admitting: Physical Therapy

## 2021-03-25 ENCOUNTER — Encounter: Payer: Self-pay | Admitting: Psychiatry

## 2021-03-25 ENCOUNTER — Other Ambulatory Visit: Payer: Self-pay

## 2021-03-25 DIAGNOSIS — F3176 Bipolar disorder, in full remission, most recent episode depressed: Secondary | ICD-10-CM

## 2021-03-25 DIAGNOSIS — F411 Generalized anxiety disorder: Secondary | ICD-10-CM | POA: Diagnosis not present

## 2021-03-25 DIAGNOSIS — F5105 Insomnia due to other mental disorder: Secondary | ICD-10-CM

## 2021-03-25 MED ORDER — TRAZODONE HCL 100 MG PO TABS: 100.0000 mg | ORAL_TABLET | Freq: Every day | ORAL | 0 refills | Status: DC

## 2021-03-25 NOTE — Progress Notes (Signed)
Virtual Visit via Telephone Note  I connected with Kendra [name] on 03/25/21 at  3:30 PM EDT by telephone and verified that I am speaking with the correct person using two identifiers.  Location Provider Location : [facility] Patient Location : Home  Participants: Patient , Spouse,Provider    I discussed the limitations, risks, security and privacy concerns of performing an evaluation and management service by telephone and the availability of in person appointments. I also discussed with the patient that there may be a patient responsible charge related to this service. The patient expressed understanding and agreed to proceed.    I discussed the assessment and treatment plan with the patient. The patient was provided an opportunity to ask questions and all were answered. The patient agreed with the plan and demonstrated an understanding of the instructions.   The patient was advised to call back or seek an in-person evaluation if the symptoms worsen or if the condition fails to improve as anticipated.   [facility] MD OP Progress Note  03/25/2021 3:38 PM Melena Hayes  MRN:  [id number]  Chief Complaint:  Chief Complaint    Follow-up; Anxiety; Depression     HPI: Kendra [name] is a 75 year old Caucasian female, married, lives in [place], has a history of bipolar disorder, GAD, insomnia, multifocal stage I right breast cancer status post treatment-lumpectomy, ovarian cyst removal, Parkinson's disease, bilateral hearing loss was evaluated by telemedicine today.  Collateral information was obtained from husband-[name].  Patient was able to answer questions appropriately.  She does have hearing problem, currently uses a hearing aid.  She hence was delayed when responding to certain questions and also needed some questions to be repeated.  Patient reports since the dose reduction of risperidone she has not noticed any worsening mood symptoms.  Patient reports sleep continues to be good on the  trazodone.  She appeared to be alert oriented to person place time and situation.  Patient reports she continues to struggle with physical symptoms of Parkinson's as well as does have cognitive or mental slowing especially in the morning.  She does have upcoming appointment with her neurologist and she plans to talk to them about this.  Collateral information obtained from husband who reports he has not noticed any worsening mood symptoms since she reduced the risperidone dosage.  Husband denies any other concerns today.  Visit Diagnosis:    ICD-10-CM   1. Bipolar disorder, in full remission, most recent episode depressed ([place])  F31.76   2. GAD (generalized anxiety disorder)  F41.1   3. Insomnia due to mental condition  F51.05 traZODone (DESYREL) 100 MG tablet    Past Psychiatric History: I have reviewed past psychiatric history from progress note on 03/01/2019.  Past trials of risperidone, Prozac, Seroquel, Ativan  Past Medical History:  Past Medical History:  Diagnosis Date  . Anxiety   . Breast cancer ([place]) 03/2018   right breast cancer at 11:00 and 1:00  . Bursitis    bilateral hips and knees  . Complication of anesthesia   . Depression   . Family history of breast cancer 03/24/2018  . Family history of lung cancer 03/24/2018  . History of alcohol dependence ([place]) 2007   no ETOH; resolved since 2007  . History of hiatal hernia   . History of psychosis 2014   due to sleep disturbance  . Hypertension   . Parkinson disease ([place])   . Personal history of radiation therapy 06/2018-07/2018   right breast ca  . PONV (postoperative nausea  and vomiting)     Past Surgical History:  Procedure Laterality Date  . BREAST BIOPSY Right 03/14/2018   11:00 DCIS and invasive ductal carcinoma  . BREAST BIOPSY Right 03/14/2018   1:00 Invasive ductal carcinoma  . BREAST LUMPECTOMY Right 04/27/2018   lumpectomy of 11 and 1:00 cancers, clear margins, negative LN  . BREAST LUMPECTOMY WITH  RADIOACTIVE SEED AND SENTINEL LYMPH NODE BIOPSY Right 04/27/2018   Procedure: RIGHT BREAST LUMPECTOMY WITH RADIOACTIVE SEED X 2 AND RIGHT SENTINEL LYMPH NODE BIOPSY;  Surgeon: [name], MD;  Location: [facility];  Service: General;  Laterality: Right;  . CATARACT EXTRACTION W/PHACO Left 08/30/2019   Procedure: CATARACT EXTRACTION PHACO AND INTRAOCULAR LENS PLACEMENT ([place]) LEFT panoptix toric  01:20.5  12.7%  10.26;  Surgeon: Leandrew Koyanagi, MD;  Location: [facility];  Service: Ophthalmology;  Laterality: Left;  . CATARACT EXTRACTION W/PHACO Right 09/20/2019   Procedure: CATARACT EXTRACTION PHACO AND INTRAOCULAR LENS PLACEMENT ([facility]) RIGHT 5.71  00:36.4  15.7%;  Surgeon: Leandrew Koyanagi, MD;  Location: [facility];  Service: Ophthalmology;  Laterality: Right;  . LAPAROTOMY N/A 02/10/2018   Procedure: EXPLORATORY LAPAROTOMY;  Surgeon: Isabel Caprice, MD;  Location: [facility];  Service: Gynecology;  Laterality: N/A;  . MASS EXCISION  01/2018   abdominal  . OVARIAN CYST REMOVAL    . SALPINGOOPHORECTOMY Bilateral 02/10/2018   Procedure: BILATERAL SALPINGO OOPHORECTOMY; PERITONEAL WASHINGS;  Surgeon: Isabel Caprice, MD;  Location: [facility];  Service: Gynecology;  Laterality: Bilateral;  . TONSILLECTOMY    . TONSILLECTOMY AND ADENOIDECTOMY  1953    Family Psychiatric History: I have reviewed family psychiatric history from progress note on 03/01/2019  Family History:  Family History  Problem Relation Age of Onset  . Diabetes Mother   . Breast cancer Mother 35  . Hypertension Mother   . Lung cancer Father        asbestos exposure  . Heart disease Brother 11  . Breast cancer Cousin 57       paternal cousin  . Heart disease Paternal Grandmother 13  . Heart disease Paternal Grandfather     Social History: I have reviewed social history from my progress note on 03/01/2019 Social History   Socioeconomic History  . Marital status: Married     Spouse name: robert  . Number of children: 0  . Years of education: Not on file  . Highest education level: Some college, no degree  Occupational History  . Occupation: retired Furniture conservator/restorer of education  Tobacco Use  . Smoking status: Never Smoker  . Smokeless tobacco: Never Used  Vaping Use  . Vaping Use: Never used  Substance and Sexual Activity  . Alcohol use: Not Currently    Comment: alcohol dependence prior to 2007  . Drug use: Never  . Sexual activity: Yes    Partners: Male    Birth control/protection: Surgical  Other Topics Concern  . Not on file  Social History Narrative  . Not on file   Social Determinants of Health   Financial Resource Strain: Low Risk   . Difficulty of Paying Living Expenses: Not hard at all  Food Insecurity: No Food Insecurity  . Worried About Charity fundraiser in the Last Year: Never true  . Ran Out of Food in the Last Year: Never true  Transportation Needs: No Transportation Needs  . Lack of Transportation (Medical): No  . Lack of Transportation (Non-Medical): No  Physical Activity: Inactive  .  Days of Exercise per Week: 0 days  . Minutes of Exercise per Session: 0 min  Stress: Stress Concern Present  . Feeling of Stress : To some extent  Social Connections: Moderately Integrated  . Frequency of Communication with Friends and Family: More than three times a week  . Frequency of Social Gatherings with Friends and Family: More than three times a week  . Attends Religious Services: Never  . Active Member of Clubs or Organizations: Yes  . Attends Archivist Meetings: 1 to 4 times per year  . Marital Status: Married    Allergies:  Allergies  Allergen Reactions  . Hydroxyzine Anaphylaxis    Tongue swollen  . Other Other (See Comments)    Food poisoning   . Shrimp [Shellfish Allergy] Other (See Comments)    Food poisoning     Metabolic Disorder Labs: Lab Results  Component Value Date   HGBA1C 5.4  04/13/2019   Lab Results  Component Value Date   PROLACTIN 101.9 06/20/2015   Lab Results  Component Value Date   CHOL 185 02/27/2021   TRIG 141 02/27/2021   HDL 59 02/27/2021   CHOLHDL 3.1 02/27/2021   LDLCALC 101 (H) 02/27/2021   LDLCALC 95 04/13/2019   Lab Results  Component Value Date   TSH 2.31 04/13/2019   TSH 2.543 07/25/2018    Therapeutic Level Labs: No results found for: LITHIUM No results found for: VALPROATE No components found for:  CBMZ  Current Medications: Current Outpatient Medications  Medication Sig Dispense Refill  . Acetaminophen 500 MG capsule Take 1,000 mg by mouth every 6 (six) hours as needed.     . carbidopa-levodopa (SINEMET IR) 25-100 MG tablet Take 1 tablet by mouth at bedtime.     . cyanocobalamin (,VITAMIN B-12,) 1000 MCG/ML injection Inject 1 mL (1,000 mcg total) into the muscle every 30 (thirty) days. 3 mL 3  . denosumab (PROLIA) 60 MG/ML SOSY injection Inject 60 mg into the skin every 6 (six) months. takes annually    . FLUoxetine HCl 60 MG TABS Take 60 mg by mouth daily. 90 tablet 1  . letrozole (FEMARA) 2.5 MG tablet Take 1 tablet (2.5 mg total) by mouth daily. 90 tablet 3  . lisinopril-hydrochlorothiazide (ZESTORETIC) 20-12.5 MG tablet Take 1 tablet by mouth daily. 90 tablet 3  . naproxen sodium (ALEVE) 220 MG tablet Take 220 mg by mouth 2 (two) times daily as needed.     Marland Kitchen NYAMYC powder APPLY TOPICALLY THREE TIMES A DAY 15 g 1  . polyethylene glycol (MIRALAX / GLYCOLAX) 17 g packet Take 17 g by mouth daily.    . risperiDONE (RISPERDAL) 0.25 MG tablet Take 3 tablets (0.75 mg total) by mouth daily. 270 tablet 0  . risperiDONE (RISPERDAL) 2 MG tablet Take 0.5 tablets (1 mg total) by mouth at bedtime. 45 tablet 0  . traZODone (DESYREL) 100 MG tablet Take 1 tablet (100 mg total) by mouth at bedtime. 90 tablet 0   Current Facility-Administered Medications  Medication Dose Route Frequency Provider Last Rate Last Admin  . lidocaine (PF)  (XYLOCAINE) 1 % injection 4 mL  4 mL Intradermal Once Virginia Crews, MD      . lidocaine (PF) (XYLOCAINE) 1 % injection 4 mL  4 mL Intradermal Once Brita Romp Dionne Bucy, MD         Musculoskeletal: Strength & Muscle Tone: [place]: UTA Patient leans: N/A  Psychiatric Specialty Exam: Review of Systems  Musculoskeletal: Positive  for myalgias.  Neurological: Positive for dizziness and tremors.  All other systems reviewed and are negative.   There were no vitals taken for this visit.There is no height or weight on file to calculate BMI.  General Appearance: UTA  Eye Contact:  UTA  Speech:  Normal Rate  Volume:  Normal  Mood:  Euthymic  Affect:  UTA  Thought Process:  Goal Directed and Descriptions of Associations: Intact  Orientation:  Full (Time, Place, and Person)  Thought Content: Logical   Suicidal Thoughts:  No  Homicidal Thoughts:  No  Memory:  Immediate;   Fair Recent;   Fair Remote;   Limited  Judgement:  Fair  Insight:  Fair  Psychomotor Activity:  Tremor-patient does have Parkinson's disease.  Concentration:  Concentration: Fair and Attention Span: Fair  Recall:  [employer] of Knowledge: Fair  Language: Fair  Akathisia:  No  Handed:  Right  AIMS (if indicated): UTA  Assets:  Communication Skills Desire for Improvement Financial Resources/Insurance Housing Intimacy Social Support Transportation  ADL's:  Intact  Cognition: Baseline  Sleep:  Fair   Screenings: [facility] Office Visit from 01/21/2021 in [facility] Total Score 0    GAD-7   Flowsheet Row Counselor from 03/25/2021 in [facility] 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 [facility]  Total GAD-7 Score 4 7    PHQ2-9   Flowsheet Row Counselor from 03/25/2021 in [facility] Office Visit from 02/27/2021 in [facility] Visit from 01/21/2021 in  [facility] from 08/29/2020 in [facility] 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 [facility]  PHQ-2 Total Score 3 2 0 2 3  PHQ-9 Total Score 14 8 -- 6 17    Flowsheet Row Counselor from 03/25/2021 in [facility] Counselor from 02/03/2021 in [facility] Office Visit from 01/21/2021 in [facility] No Risk No Risk No Risk       Assessment and Plan: Erva Koke is a 75 year old Caucasian female, married, lives in [place], has a history of bipolar disorder, GAD, insomnia, multiple medical problems including primary Parkinson's disease, hearing loss was evaluated by telemedicine today.  Patient with multiple psychosocial stressors is currently stable on medications.  Plan as noted below.  Plan Bipolar disorder in remission Continue reduced dosage of risperidone 0.75 mg p.o. daily in the morning and 1 mg p.o. nightly Prozac 60 mg p.o. daily  GAD-stable Continue CBT with Ms. Christina Hussami Prozac 60 mg p.o. daily  Insomnia-stable Trazodone 50-100 mg p.o. nightly as needed Melatonin as needed  Collateral information obtained from husband as noted above.  Patient to continue to follow-up with neurologist, has upcoming appointment this week for management of Parkinson's disease.  Follow-up in clinic in 2 months in office or sooner as needed.   I have spent at least 20 minutes non face to face with patient today.  This note was generated in part or whole with voice recognition software. Voice recognition is usually quite accurate but there are transcription errors that can and very often do occur. I apologize for any typographical errors that were not detected and corrected.       Ursula Alert, MD 03/26/2021, 1:44 PM

## 2021-03-25 NOTE — Progress Notes (Signed)
Virtual Visit via Audio Note  I connected with Kendra [name] on 03/25/21 at  9:00 AM EDT by an audio enabled telemedicine application and verified that I am speaking with the correct person using two identifiers.  Location: Patient: home Provider: remote office [place], [place])   I discussed the limitations of evaluation and management by telemedicine and the availability of in person appointments. The patient expressed understanding and agreed to proceed.   I discussed the assessment and treatment plan with the patient. The patient was provided an opportunity to ask questions and all were answered. The patient agreed with the plan and demonstrated an understanding of the instructions.   The patient was advised to call back or seek an in-person evaluation if the symptoms worsen or if the condition fails to improve as anticipated.  I provided 60 minutes of non-face-to-face time during this encounter.   Alder Murri R Setsuko Robins, LCSW    THERAPIST PROGRESS NOTE  Session Time: 9-10a  Participation Level: None  Behavioral Response: NAAlertAnxious  Type of Therapy: Individual Therapy  Treatment Goals addressed: Anxiety  Interventions: CBT, Solution Focused and Supportive  Summary: Kendra [name] is a 75 y.o. female who presents with continuing symptoms related to bipolar disorder. Pt reports that overall mood is stable and that she is managing stress and anxiety symptoms well. Pt reports good quality and quantity of sleep.    Allowed pt to explore and express thoughts and feelings about current life events and external stressors. Pt states that she feels like her Parkinsons is taking a toll on her focus and concentration in the mornings. Reviewed pts daily schedule and feel that afternoon appointments would be a better time for her focus and concentration. Pt also reports that she has a specific fear about needing a colonoscopy--reviewed origin of fear and gave pt unconditional support.    Pt feels that she is getting around the house better balance-wise and has not had any recent falls.    Pt and husband continue to enjoy recreational activities together. Pts husband very supportive. Pt states that she and husband sit down together every weekend to review the upcoming week--activities planned and any doctor appts.   Discussed support groups for individuals with Parkinson's disorder--there is a list in pts chart that was given to her recently.    Continued recommendations are as follows: self care behaviors, positive social engagements, focusing on overall work/home/life balance, and focusing on positive physical and emotional wellness.     Suicidal/Homicidal: No  Therapist Response: Kendra [name] is continuing to identify anxiety triggers and utilize strategies that have been helpful in the past to manage symptoms. Kendra [name] is continuing to develop healthy cognitive patterns and is participating in regular social activities/conversations that reflect increasing energy, interest, and is a tool to help overcome depression symptoms. Pts choices reflect personal growth and progress. Treatment to continue as indicated.   Plan: Return again in 4 weeks.  Diagnosis: Axis I: Bipolar, Depressed and Generalized Anxiety Disorder    Axis II: No diagnosis    [place], LCSW 03/25/2021

## 2021-03-26 ENCOUNTER — Ambulatory Visit: Payer: Medicare PPO

## 2021-03-26 ENCOUNTER — Telehealth: Payer: Medicare PPO | Admitting: Psychiatry

## 2021-03-26 DIAGNOSIS — M25551 Pain in right hip: Secondary | ICD-10-CM | POA: Diagnosis not present

## 2021-03-26 DIAGNOSIS — M25622 Stiffness of left elbow, not elsewhere classified: Secondary | ICD-10-CM | POA: Diagnosis not present

## 2021-03-26 DIAGNOSIS — R262 Difficulty in walking, not elsewhere classified: Secondary | ICD-10-CM

## 2021-03-26 DIAGNOSIS — M25552 Pain in left hip: Secondary | ICD-10-CM

## 2021-03-26 DIAGNOSIS — M6281 Muscle weakness (generalized): Secondary | ICD-10-CM | POA: Diagnosis not present

## 2021-03-26 DIAGNOSIS — M25522 Pain in left elbow: Secondary | ICD-10-CM | POA: Diagnosis not present

## 2021-03-26 NOTE — Therapy (Signed)
[facility] [address] [place], [address] Phone: [phone]   Fax:  [phone]  Physical Therapy Treatment  Patient Details  Name: Kendra [name] MRN: [id number] Date of Birth: 02-Oct-1946 No data recorded  Encounter Date: 03/26/2021   PT End of Session - 03/26/21 1355    Visit Number 102    Number of Visits 100    Date for PT Re-Evaluation 04/02/21    Authorization Type [employer]: Approved for 24 visits from 01/01/21-04/01/21; New approval through August see visit notes.    Authorization Time Period Cert 5/95/63-8/75/64    Authorization - Visit Number 16    Authorization - Number of Visits 24    PT Start Time 1350    PT Stop Time 1430    PT Time Calculation (min) 40 min    Equipment Utilized During Treatment Gait belt    Activity Tolerance Patient tolerated treatment well;No increased pain    Behavior During Therapy WFL for tasks assessed/performed           Past Medical History:  Diagnosis Date  . Anxiety   . Breast cancer ([place]) 03/2018   right breast cancer at 11:00 and 1:00  . Bursitis    bilateral hips and knees  . Complication of anesthesia   . Depression   . Family history of breast cancer 03/24/2018  . Family history of lung cancer 03/24/2018  . History of alcohol dependence ([place]) 2007   no ETOH; resolved since 2007  . History of hiatal hernia   . History of psychosis 2014   due to sleep disturbance  . Hypertension   . Parkinson disease ([place])   . Personal history of radiation therapy 06/2018-07/2018   right breast ca  . PONV (postoperative nausea and vomiting)     Past Surgical History:  Procedure Laterality Date  . BREAST BIOPSY Right 03/14/2018   11:00 DCIS and invasive ductal carcinoma  . BREAST BIOPSY Right 03/14/2018   1:00 Invasive ductal carcinoma  . BREAST LUMPECTOMY Right 04/27/2018   lumpectomy of 11 and 1:00 cancers, clear margins, negative LN  . BREAST  LUMPECTOMY WITH RADIOACTIVE SEED AND SENTINEL LYMPH NODE BIOPSY Right 04/27/2018   Procedure: RIGHT BREAST LUMPECTOMY WITH RADIOACTIVE SEED X 2 AND RIGHT SENTINEL LYMPH NODE BIOPSY;  Surgeon: [name], MD;  Location: [facility];  Service: General;  Laterality: Right;  . CATARACT EXTRACTION W/PHACO Left 08/30/2019   Procedure: CATARACT EXTRACTION PHACO AND INTRAOCULAR LENS PLACEMENT ([place]) LEFT panoptix toric  01:20.5  12.7%  10.26;  Surgeon: Leandrew Koyanagi, MD;  Location: [facility];  Service: Ophthalmology;  Laterality: Left;  . CATARACT EXTRACTION W/PHACO Right 09/20/2019   Procedure: CATARACT EXTRACTION PHACO AND INTRAOCULAR LENS PLACEMENT ([facility]) RIGHT 5.71  00:36.4  15.7%;  Surgeon: Leandrew Koyanagi, MD;  Location: [facility];  Service: Ophthalmology;  Laterality: Right;  . LAPAROTOMY N/A 02/10/2018   Procedure: EXPLORATORY LAPAROTOMY;  Surgeon: Isabel Caprice, MD;  Location: [facility];  Service: Gynecology;  Laterality: N/A;  . MASS EXCISION  01/2018   abdominal  . OVARIAN CYST REMOVAL    . SALPINGOOPHORECTOMY Bilateral 02/10/2018   Procedure: BILATERAL SALPINGO OOPHORECTOMY; PERITONEAL WASHINGS;  Surgeon: Isabel Caprice, MD;  Location: [facility];  Service: Gynecology;  Laterality: Bilateral;  . TONSILLECTOMY    . TONSILLECTOMY AND ADENOIDECTOMY  1953    There were no vitals filed for this visit.   Subjective Assessment - 03/26/21 1353  Subjective Pt doing ok today. No medication changes. She reports bradykinetic compliance of HEP at home. Has pain in legs when moving.    Pertinent History Patient report unclear regarding onset. Visited MD on Sept 16 and received injection with relief for approximately 1 month. Reports she cannot receive an additional injection pending eye surgery. Received dx from MD of hip bursitis in B hips. Reports pain with walking, stanindg, sitting 5/10 current 7/10 worst 3/10 best, with temporary relief when she is  sitting "just right" and with naproxen. Agg: activity. Cormorbidities: hx of breast cancer, HTN, MCI, insomnia, cataracts    Limitations Sitting;Standing;Walking;House hold activities    Currently in Pain? Yes    Pain Score 5     Pain Location --   thighs hips and backs          INTERVENTION THIS DATE: -Nustep, seat 8, arms 13, Level 2 x 6 mintes, SPM>60 (improved leg pain)  -pink physio ball toss catch to author 44ft -seated in chair, deadlift ball tap to floor moving to overhead toss/catch x15 physio ball  -pink physio ball toss catch to author 26ft *rest interval  -5xSTS 17.20sec (hands free, cued for fastest performance, chair + airex)  *rest interval  -normal stance, firm surface wall rebounding (schoolbus physioball) 1x15 (high as possible on wall, 97ft from wall)  *rest interval  -5xSTS 13.84sec (hands free, cued for fastest performance, chair + airex)  *rest interval  -schoolbus physio ball slams and catches x5, broccoli physio ball x5, then broccoli physio ball floor pass x15 *rest interval  -5xSTS 15.09sec (hands free, cued for fastest performance, chair + airex)    PT Short Term Goals - 01/14/21 1029      PT SHORT TERM GOAL #1   Title Patient will demonstrate complaince with HEP to speed recovery and reduce total number of visits.    Baseline HEP given; 01/14/2021: Independent with HEP    Time 2    Period Weeks    Status Achieved    Target Date 09/27/19             PT Long Term Goals - 03/12/21 1009      PT LONG TERM GOAL #1   Title Patient will improve hip strength to 4/5 grossly to facilitate improved mobility with ADLs.    Baseline 3, 4-; 12/22 deferred to NV; 11/09/2019: 4-/5; 12/07/2019 L hip flexor 4, abd and ext 3+; 01/29/2020: 4/5 for most hip motions, 3+/5 for abd, ext; 03/20/2020: 4/5 for hip movements    Time 8    Period Weeks    Status Achieved      PT LONG TERM GOAL #2   Title Patient will demonstrate walking tolerance increased to 30 minutes without  increase in symptoms to demonstrate improved ability to perform walking program for cardiovascular health.    Baseline 10 minute tolerance (patient report); 12/22 10 minutes (per pt report); 11/09/2019: 7 min; 12/07/2019 unable to assess due to icy streets;  01/29/2020: 10 min; 03/20/2020 10 min; 05/14/2020: 10 min x 3 per day; 06/11/2020: 73min x 3 per day; 07/15/2020: 43min x 3 per day; 09/02/2020: 38min x 3 (just standing rest breaks in between); 11/06/2019: 10 min x 3    Time 8    Period Weeks    Status On-going      PT LONG TERM GOAL #3   Title Patient will report worst pain of 3/10 during therapy to demonstrate reduced disability    Baseline Worst 7/10; 12/22 worst  9/10; 11/09/2019: 9/10; 12/07/2019 7/10; 01/29/2020: 7/10; 05/14/2020: 0/10; 03/12/21: 5/10    Time 8    Period Weeks    Status On-going      PT LONG TERM GOAL #4   Title Patient will demonstrate ability to perform all bed mobility maneuvers without increase in pain for reduced disability with mobility and transfers with ADLs.    Baseline Reports pain with bed mobility supine <> sidelying L/R, sidelying to sitting EOB; 12/22 pt able to perform I without increased pain    Time 8    Period Weeks    Status Achieved      PT LONG TERM GOAL #5   Title Patient will improve 10MW speed to 1.0 m/sec to meet norms for minimal fall risk and full community ambulation.    Baseline 0.88 m/sec; 12/22 0.96 m/sec; 11/09/2019: deferred; 12/07/2019 0.90 m/sec; 01/29/2020: .9  m/sec; 03/20/2020: .40m/s; 05/14/2020: . 9 m/s; 06/11/2020: .81m/s; 07/15/2020: 1 m/s; 03/12/21:0.80m/s    Time 8    Period Weeks    Status On-going    Target Date 01/18/20      PT LONG TERM GOAL #6   Title Patient will increase 6MWT by 200 ft to demonstrate improvement in functional capacity for transition to cardiac walking program    Baseline 6MWT = 1195; 1/7/20201: deferred; 12/07/2019 1095; 01/29/2020: deferred secondary to fatigue; 03/20/2020: 1021ft; 07/15/2020: 1184ft; 09/03/2020: 82ft;  11/05/2020: 915ft; 01/13/2021: 1053ft    Time 8    Period Weeks    Status On-going      PT LONG TERM GOAL #7   Title Patient will improve her 5xsts to under 15 sec to indicate singificnat improvement in LE functional strength and decrease in fall risk.    Baseline 24.5 sec; 03/20/2020: 19.19 sec; 06/13/2020: 14.92; 03/12/21: 12.91sec    Time 6    Period Weeks    Status Achieved      PT LONG TERM GOAL #8   Title Patient will improve FGA to 28/30 to indicate improvement in dynamic balance and improvement in stability.    Baseline 23/30; 06/11/2020: 25/30; 07/15/2020: 26/30; 09/02/2020: 27/30; 11/05/2020: 28/30    Time 6    Period Weeks    Status Achieved      PT LONG TERM GOAL  #9   TITLE Patient will be able to ascend/desend the stairs without use of UEs to increase safety and decrease fall risk with stair use.    Baseline requires use of UEs; 01/13/2021: able to perform with CGA; 03/12/21: able to perform 4 steps/up and down hands free supervision level   fluctuating due to dizziness fluctuation   Time 6    Period Weeks    Status On-going      PT LONG TERM GOAL  #10   TITLE Patient will improve her TUG scores to under 12 sec for normal tug; 15 sec for cognitive TUG to decrease fall risk overall    Baseline 01/13/21: 17.2 Regular ; 27.3 sec cognitive; 5/11 12.22sec regular (cognitive not assessed)    Time 6    Period Weeks    Status On-going                 Plan - 03/26/21 1359    Clinical Impression Statement Continued with current plan of care as laid out in evaluation and recent prior sessions. Pt remains motivated to advance progress toward goals. Session with heavy focus on large amplitude high velocity movements, mostly throwing/catching activity and mostly in sagittal plane. No  LOB in session, pt has quick responses for catching balls and reaching outside of immediate area. Rest breaks provided as needed, pt quick to ask when needed. Pt does require varying levels of assistance and  cuing for completion of exercises for correct form and sometimes due to pain/weakness. Pt closely monitored throughout session for safe vitals response and to maximize patient safety during interventions. Pt continues to demonstrate progress toward goals AEB progression of some interventions this date either in volume or intensity.   Personal Factors and Comorbidities Age;Comorbidity 3+;Time since onset of injury/illness/exacerbation;Comorbidity 2;Other    Comorbidities MCI, HTN, CA, cataracts    Examination-Activity Limitations Bend;Sit;Squat;Stairs;Stand;Sleep    Examination-Participation Restrictions Cleaning;Community Activity    Stability/Clinical Decision Making Stable/Uncomplicated    Clinical Decision Making Moderate    Rehab Potential Fair    Clinical Impairments Affecting Rehab Potential MCI    PT Frequency 2x / week    PT Duration 6 weeks    PT Treatment/Interventions ADLs/Self Care Home Management;Therapeutic exercise;Patient/family education;Neuromuscular re-education;Therapeutic activities;Functional mobility training;Balance training;Manual techniques;Gait training;Stair training;Moist Heat;Electrical Stimulation;Cryotherapy;Taping;Dry needling;Energy conservation;Passive range of motion;Joint Manipulations    PT Next Visit Plan Discuss plan for DC, update HEP as needed; visit approval ends after 5/31    PT Home Exercise Plan no updates this visit    Consulted and Agree with Plan of Care Patient           Patient will benefit from skilled therapeutic intervention in order to improve the following deficits and impairments:  Decreased range of motion,Postural dysfunction,Pain,Abnormal gait,Decreased activity tolerance,Decreased coordination,Decreased endurance,Decreased balance,Decreased mobility,Difficulty walking,Hypomobility,Increased muscle spasms,Impaired perceived functional ability,Decreased strength,Impaired flexibility  Visit Diagnosis: Difficulty in walking, not  elsewhere classified  Muscle weakness (generalized)  Pain in right hip  Pain in left hip     Problem List Patient Active Problem List   Diagnosis Date Noted  . Osteopenia of neck of right femur 09/06/2020  . Obesity 08/29/2020  . Parkinson disease ([place]) 02/22/2020  . 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 Bipolar disorder, in full remission, most recent episode depressed ([place]) 11/08/2019  . Avitaminosis D 08/24/2019  . Insomnia due to mental condition 05/31/2019  . Bipolar I disorder, most recent episode depressed ([place]) 05/31/2019  . Cognitive disorder 05/31/2019  . Alcohol use disorder, moderate, in sustained remission ([place]) 05/31/2019  . Elevated tumor markers 05/27/2019  . GAD (generalized anxiety disorder) 04/10/2019  . MDD (major depressive disorder), severe ([place]) 01/31/2019  . High serum carbohydrate antigen 19-9 (CA19-9) 12/01/2018  . Adjustment disorder with anxious mood 11/24/2018  . Essential hypertension 11/24/2018  . B12 deficiency 10/17/2018  . Incisional hernia, without obstruction or gangrene 09/12/2018  . Genetic testing 04/07/2018  . Malignant neoplasm of upper-outer quadrant of right breast in female, estrogen receptor positive ([place]) 03/17/2018  . Malignant neoplasm of upper-inner quadrant of right breast in female, estrogen receptor positive ([place]) 03/17/2018  . History of psychosis 02/07/2018  . History of insomnia 02/07/2018   2:29 PM, 03/26/21 Etta Grandchild, PT, DPT Physical Therapist - [facility] [phone] (Office)    Miroslava Santellan C 03/26/2021, 2:06 PM  [facility] [address], [place], [address] Phone: [phone]   Fax:  [phone]  Name: Matrice Herro MRN: [id number] Date of Birth: 06-17-1946

## 2021-04-02 ENCOUNTER — Ambulatory Visit: Payer: Medicare PPO | Attending: Family Medicine | Admitting: Physical Therapy

## 2021-04-02 ENCOUNTER — Encounter: Payer: Self-pay | Admitting: Physical Therapy

## 2021-04-02 ENCOUNTER — Other Ambulatory Visit: Payer: Self-pay

## 2021-04-02 DIAGNOSIS — R262 Difficulty in walking, not elsewhere classified: Secondary | ICD-10-CM | POA: Diagnosis not present

## 2021-04-02 DIAGNOSIS — M6281 Muscle weakness (generalized): Secondary | ICD-10-CM | POA: Insufficient documentation

## 2021-04-02 NOTE — Therapy (Signed)
[facility] [address] [place], [address] Phone: [phone]   Fax:  [phone]  Physical Therapy Treatment  Patient Details  Name: Kendra [name] MRN: [id number] Date of Birth: 1946-02-19 No data recorded  Encounter Date: 04/02/2021   PT End of Session - 04/02/21 1123    Visit Number 103    Number of Visits 150    Date for PT Re-Evaluation 06/25/21    Authorization Type Needs [employer] auth    Authorization Time Period 24 visits from 04/02/21- 06/27/21    Authorization - Visit Number 1    Authorization - Number of Visits 24    PT Start Time 1034    PT Stop Time 1112    PT Time Calculation (min) 38 min    Equipment Utilized During Treatment Gait belt    Activity Tolerance Patient tolerated treatment well;No increased pain    Behavior During Therapy WFL for tasks assessed/performed           Past Medical History:  Diagnosis Date  . Anxiety   . Breast cancer ([place]) 03/2018   right breast cancer at 11:00 and 1:00  . Bursitis    bilateral hips and knees  . Complication of anesthesia   . Depression   . Family history of breast cancer 03/24/2018  . Family history of lung cancer 03/24/2018  . History of alcohol dependence ([place]) 2007   no ETOH; resolved since 2007  . History of hiatal hernia   . History of psychosis 2014   due to sleep disturbance  . Hypertension   . Parkinson disease ([place])   . Personal history of radiation therapy 06/2018-07/2018   right breast ca  . PONV (postoperative nausea and vomiting)     Past Surgical History:  Procedure Laterality Date  . BREAST BIOPSY Right 03/14/2018   11:00 DCIS and invasive ductal carcinoma  . BREAST BIOPSY Right 03/14/2018   1:00 Invasive ductal carcinoma  . BREAST LUMPECTOMY Right 04/27/2018   lumpectomy of 11 and 1:00 cancers, clear margins, negative LN  . BREAST LUMPECTOMY WITH RADIOACTIVE SEED AND SENTINEL LYMPH NODE BIOPSY Right 04/27/2018    Procedure: RIGHT BREAST LUMPECTOMY WITH RADIOACTIVE SEED X 2 AND RIGHT SENTINEL LYMPH NODE BIOPSY;  Surgeon: [name], MD;  Location: [facility];  Service: General;  Laterality: Right;  . CATARACT EXTRACTION W/PHACO Left 08/30/2019   Procedure: CATARACT EXTRACTION PHACO AND INTRAOCULAR LENS PLACEMENT ([place]) LEFT panoptix toric  01:20.5  12.7%  10.26;  Surgeon: Leandrew Koyanagi, MD;  Location: [facility];  Service: Ophthalmology;  Laterality: Left;  . CATARACT EXTRACTION W/PHACO Right 09/20/2019   Procedure: CATARACT EXTRACTION PHACO AND INTRAOCULAR LENS PLACEMENT ([facility]) RIGHT 5.71  00:36.4  15.7%;  Surgeon: Leandrew Koyanagi, MD;  Location: [facility];  Service: Ophthalmology;  Laterality: Right;  . LAPAROTOMY N/A 02/10/2018   Procedure: EXPLORATORY LAPAROTOMY;  Surgeon: Isabel Caprice, MD;  Location: [facility];  Service: Gynecology;  Laterality: N/A;  . MASS EXCISION  01/2018   abdominal  . OVARIAN CYST REMOVAL    . SALPINGOOPHORECTOMY Bilateral 02/10/2018   Procedure: BILATERAL SALPINGO OOPHORECTOMY; PERITONEAL WASHINGS;  Surgeon: Isabel Caprice, MD;  Location: [facility];  Service: Gynecology;  Laterality: Bilateral;  . TONSILLECTOMY    . TONSILLECTOMY AND ADENOIDECTOMY  1953    There were no vitals filed for this visit.   Subjective Assessment - 04/02/21 1039    Subjective Patient is doing well today. Reports  she has been having pain when she stands up from a chair in the back of her legs and lower back, but this has been goingon for a "very long time", feels achy. Reports she is completing HEP, but does not do exercises "as fast" as she does them here.    Pertinent History Patient report unclear regarding onset. Visited MD on Sept 16 and received injection with relief for approximately 1 month. Reports she cannot receive an additional injection pending eye surgery. Received dx from [facility] of hip bursitis in B hips. Reports pain with walking, stanindg,  sitting 5/10 current 7/10 worst 3/10 best, with temporary relief when she is sitting "just right" and with naproxen. Agg: activity. Cormorbidities: hx of breast cancer, HTN, MCI, insomnia, cataracts    Limitations Sitting;Standing;Walking;House hold activities    How long can you sit comfortably? Pain constant, reduced with neutral pelvic tilt    How long can you stand comfortably? Pain constant, increases with time    How long can you walk comfortably? Pain constant, increases with time    Patient Stated Goals Be able to move without hurting, walk 1 hour    Pain Onset More than a month ago           INTERVENTION THIS DATE: -Nustep, seat 8, arms 13, Level 3 x6 mintes, SPM>60; 50sec "normal" pace 10sec "fastest" speed x6 - 5x STS x2 trials, fastest time 18sec without UE support, cuing for fastest available - Mini squat with ball slam 3x 6 with cuing for reset between reps to allow for reset of "big" position with arms above head - ambulation 169ft with cuing for large steps and large arm swings with some carry over - Amb 253ft with 3# AW and cuing for alt UE big swing with patient able to comply with cuing, with much better foot clearance with wt; CGA for safety - UE with CLLE forward flex with UEs with 2# AW on LEs and YTB posterior resistance for UE x12 each (attempting alt difficult for coordination) -subsequent 214ft with 2# AW bilat with better ability to coordinate UE swing with LE swing intermittently                          PT Education - 04/02/21 1044    Education provided Yes    Education Details therex form/techinque    Person(s) Educated Patient    Methods Explanation;Demonstration;Verbal cues    Comprehension Verbalized understanding;Returned demonstration;Verbal cues required            PT Short Term Goals - 01/14/21 1029      PT SHORT TERM GOAL #1   Title Patient will demonstrate complaince with HEP to speed recovery and reduce total number of  visits.    Baseline HEP given; 01/14/2021: Independent with HEP    Time 2    Period Weeks    Status Achieved    Target Date 09/27/19             PT Long Term Goals - 04/02/21 1129      PT LONG TERM GOAL #1   Title Patient will improve hip strength to 4/5 grossly to facilitate improved mobility with ADLs.    Baseline 3, 4-; 12/22 deferred to [place]; 11/09/2019: 4-/5; 12/07/2019 L hip flexor 4, abd and ext 3+; 01/29/2020: 4/5 for most hip motions, 3+/5 for abd, ext; 03/20/2020: 4/5 for hip movements    Time 8    Period Weeks  Status Achieved      PT LONG TERM GOAL #2   Title Patient will demonstrate walking tolerance increased to 30 minutes without increase in symptoms to demonstrate improved ability to perform walking program for cardiovascular health.    Baseline 04/02/21 10 minute tolerance (patient report); 12/22 10 minutes (per pt report); 11/09/2019: 7 min; 12/07/2019 unable to assess due to icy streets;  01/29/2020: 10 min; 03/20/2020 10 min; 05/14/2020: 10 min x 3 per day; 06/11/2020: 66min x 3 per day; 07/15/2020: 43min x 3 per day; 09/02/2020: 33min x 3 (just standing rest breaks in between); 11/06/2019: 10 min x 3    Time 8    Period Weeks    Status On-going      PT LONG TERM GOAL #3   Title Patient will report worst pain of 3/10 during therapy to demonstrate reduced disability    Baseline 04/02/21 Worst 7/10; 12/22 worst 9/10; 11/09/2019: 9/10; 12/07/2019 7/10; 01/29/2020: 7/10; 05/14/2020: 0/10; 03/12/21: 5/10    Time 8    Period Weeks    Status On-going      PT LONG TERM GOAL #4   Title Patient will demonstrate ability to perform all bed mobility maneuvers without increase in pain for reduced disability with mobility and transfers with ADLs.    Baseline Reports pain with bed mobility supine <> sidelying L/R, sidelying to sitting EOB; 12/22 pt able to perform I without increased pain    Time 8    Period Weeks    Status Achieved      PT LONG TERM GOAL #5   Title Patient will improve 10MW speed  to 1.0 m/sec to meet norms for minimal fall risk and full community ambulation.    Baseline 04/02/21 0.88 m/sec; 12/22 0.96 m/sec; 11/09/2019: deferred; 12/07/2019 0.90 m/sec; 01/29/2020: .9  m/sec; 03/20/2020: .7m/s; 05/14/2020: . 9 m/s; 06/11/2020: .43m/s; 07/15/2020: 1 m/s; 03/12/21:0.23m/s    Time 8    Period Weeks    Status On-going      PT LONG TERM GOAL #6   Title Patient will increase 6MWT by 200 ft to demonstrate improvement in functional capacity for transition to cardiac walking program    Baseline 03/12/21 6MWT = 1195; 1/7/20201: deferred; 12/07/2019 1095; 01/29/2020: deferred secondary to fatigue; 03/20/2020: 1051ft; 07/15/2020: 1122ft; 09/03/2020: 840ft; 11/05/2020: 911ft; 01/13/2021: 1066ft    Time 8    Period Weeks    Status On-going      PT LONG TERM GOAL #7   Title Patient will improve her 5xsts to under 15 sec to indicate singificnat improvement in LE functional strength and decrease in fall risk.    Baseline 24.5 sec; 03/20/2020: 19.19 sec; 06/13/2020: 14.92; 03/12/21: 12.91sec    Time 6    Period Weeks    Status Achieved      PT LONG TERM GOAL #8   Title Patient will improve FGA to 28/30 to indicate improvement in dynamic balance and improvement in stability.    Baseline 23/30; 06/11/2020: 25/30; 07/15/2020: 26/30; 09/02/2020: 27/30; 11/05/2020: 28/30    Time 6    Period Weeks    Status Achieved      PT LONG TERM GOAL  #9   TITLE Patient will be able to ascend/desend the stairs without use of UEs to increase safety and decrease fall risk with stair use.    Baseline requires use of UEs; 01/13/2021: able to perform with CGA; 03/12/21: able to perform 4 steps/up and down hands free supervision level    Time 6  Period Weeks    Status Deferred      PT LONG TERM GOAL  #10   TITLE Patient will improve her TUG scores to under 12 sec for normal tug; 15 sec for cognitive TUG to decrease fall risk overall    Baseline 01/13/21: 17.2 Regular ; 27.3 sec cognitive; 5/11 12.22sec regular (cognitive not  assessed)    Time 6    Period Weeks    Status Deferred                 Plan - 04/02/21 1152    Clinical Impression Statement PT with reassessment for re-cert this session. Paitent is continuing to demonstrate slow steady progress through all goals. PT continued to focus on high amplitude movements with good motivation and decent carry over of multimodal cuing. PT followed up with PCP for LSVT referral for PKD symptoms and PKD specific training, with referral sent to [facility]. Patient will continue to benefit from skilled PT to address impairments to continue progress toward goals, for decreased fall risk, and increased community activity participation. PT will continue progressiona s able.    Personal Factors and Comorbidities Age;Comorbidity 3+;Time since onset of injury/illness/exacerbation;Comorbidity 2;Other    Comorbidities MCI, HTN, CA, cataracts    Examination-Activity Limitations Bend;Sit;Squat;Stairs;Stand;Sleep    Examination-Participation Restrictions Cleaning;Community Activity    Stability/Clinical Decision Making Stable/Uncomplicated    Clinical Decision Making Moderate    Rehab Potential Fair    Clinical Impairments Affecting Rehab Potential MCI    PT Frequency 2x / week    PT Duration 6 weeks    PT Treatment/Interventions ADLs/Self Care Home Management;Therapeutic exercise;Patient/family education;Neuromuscular re-education;Therapeutic activities;Functional mobility training;Balance training;Manual techniques;Gait training;Stair training;Moist Heat;Electrical Stimulation;Cryotherapy;Taping;Dry needling;Energy conservation;Passive range of motion;Joint Manipulations    PT Next Visit Plan Discuss plan for DC, update HEP as needed; visit approval ends after 5/31    PT Home Exercise Plan no updates this visit    Consulted and Agree with Plan of Care Patient           Patient will benefit from skilled therapeutic intervention in order to improve the following deficits  and impairments:  Decreased range of motion,Postural dysfunction,Pain,Abnormal gait,Decreased activity tolerance,Decreased coordination,Decreased endurance,Decreased balance,Decreased mobility,Difficulty walking,Hypomobility,Increased muscle spasms,Impaired perceived functional ability,Decreased strength,Impaired flexibility  Visit Diagnosis: Difficulty in walking, not elsewhere classified  Muscle weakness (generalized)     Problem List Patient Active Problem List   Diagnosis Date Noted  . Osteopenia of neck of right femur 09/06/2020  . Obesity 08/29/2020  . Parkinson disease ([place]) 02/22/2020  . 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 Bipolar disorder, in full remission, most recent episode depressed ([place]) 11/08/2019  . Avitaminosis D 08/24/2019  . Insomnia due to mental condition 05/31/2019  . Bipolar I disorder, most recent episode depressed ([place]) 05/31/2019  . Cognitive disorder 05/31/2019  . Alcohol use disorder, moderate, in sustained remission ([place]) 05/31/2019  . Elevated tumor markers 05/27/2019  . GAD (generalized anxiety disorder) 04/10/2019  . MDD (major depressive disorder), severe ([place]) 01/31/2019  . High serum carbohydrate antigen 19-9 (CA19-9) 12/01/2018  . Adjustment disorder with anxious mood 11/24/2018  . Essential hypertension 11/24/2018  . B12 deficiency 10/17/2018  . Incisional hernia, without obstruction or gangrene 09/12/2018  . Genetic testing 04/07/2018  . Malignant neoplasm of upper-outer quadrant of right breast in female, estrogen receptor positive ([place]) 03/17/2018  . Malignant neoplasm of upper-inner quadrant of right breast in female, estrogen receptor positive ([place]) 03/17/2018  . History of psychosis 02/07/2018  . History of insomnia  02/07/2018   Koty Anctil Terance Hart DPT [place] Reges 04/02/2021, 11:58 AM  [facility] [address], [place], [address] Phone: [phone]   Fax:   [phone]  Name: Minha Fulco MRN: [id number] Date of Birth: [birth year]

## 2021-04-08 ENCOUNTER — Encounter: Payer: Self-pay | Admitting: *Deleted

## 2021-04-09 ENCOUNTER — Ambulatory Visit: Payer: Medicare PPO | Admitting: Physical Therapy

## 2021-04-09 ENCOUNTER — Other Ambulatory Visit: Payer: Self-pay

## 2021-04-09 ENCOUNTER — Encounter: Payer: Self-pay | Admitting: Physical Therapy

## 2021-04-09 DIAGNOSIS — M6281 Muscle weakness (generalized): Secondary | ICD-10-CM | POA: Diagnosis not present

## 2021-04-09 DIAGNOSIS — R262 Difficulty in walking, not elsewhere classified: Secondary | ICD-10-CM | POA: Diagnosis not present

## 2021-04-09 NOTE — Therapy (Signed)
[facility] [address] [place], [address] Phone: [phone]   Fax:  [phone]  Physical Therapy Treatment  Patient Details  Name: Kendra [name] MRN: [id number] Date of Birth: Oct 03, 1946 No data recorded  Encounter Date: 04/09/2021   PT End of Session - 04/09/21 1308    Visit Number 104    Number of Visits 150    Date for PT Re-Evaluation 06/25/21    Authorization Type Needs [employer] auth    Authorization Time Period 24 visits from 04/02/21- 06/27/21    Authorization - Visit Number 2    Authorization - Number of Visits 24    PT Start Time 1749    PT Stop Time 1340    PT Time Calculation (min) 38 min    Equipment Utilized During Treatment Gait belt    Activity Tolerance Patient tolerated treatment well;No increased pain    Behavior During Therapy WFL for tasks assessed/performed           Past Medical History:  Diagnosis Date  . Anxiety   . Breast cancer ([place]) 03/2018   right breast cancer at 11:00 and 1:00  . Bursitis    bilateral hips and knees  . Complication of anesthesia   . Depression   . Family history of breast cancer 03/24/2018  . Family history of lung cancer 03/24/2018  . History of alcohol dependence ([place]) 2007   no ETOH; resolved since 2007  . History of hiatal hernia   . History of psychosis 2014   due to sleep disturbance  . Hypertension   . Parkinson disease ([place])   . Personal history of radiation therapy 06/2018-07/2018   right breast ca  . PONV (postoperative nausea and vomiting)     Past Surgical History:  Procedure Laterality Date  . BREAST BIOPSY Right 03/14/2018   11:00 DCIS and invasive ductal carcinoma  . BREAST BIOPSY Right 03/14/2018   1:00 Invasive ductal carcinoma  . BREAST LUMPECTOMY Right 04/27/2018   lumpectomy of 11 and 1:00 cancers, clear margins, negative LN  . BREAST LUMPECTOMY WITH RADIOACTIVE SEED AND SENTINEL LYMPH NODE BIOPSY Right 04/27/2018    Procedure: RIGHT BREAST LUMPECTOMY WITH RADIOACTIVE SEED X 2 AND RIGHT SENTINEL LYMPH NODE BIOPSY;  Surgeon: [name], MD;  Location: [facility];  Service: General;  Laterality: Right;  . CATARACT EXTRACTION W/PHACO Left 08/30/2019   Procedure: CATARACT EXTRACTION PHACO AND INTRAOCULAR LENS PLACEMENT ([place]) LEFT panoptix toric  01:20.5  12.7%  10.26;  Surgeon: Leandrew Koyanagi, MD;  Location: [facility];  Service: Ophthalmology;  Laterality: Left;  . CATARACT EXTRACTION W/PHACO Right 09/20/2019   Procedure: CATARACT EXTRACTION PHACO AND INTRAOCULAR LENS PLACEMENT ([facility]) RIGHT 5.71  00:36.4  15.7%;  Surgeon: Leandrew Koyanagi, MD;  Location: [facility];  Service: Ophthalmology;  Laterality: Right;  . LAPAROTOMY N/A 02/10/2018   Procedure: EXPLORATORY LAPAROTOMY;  Surgeon: Isabel Caprice, MD;  Location: [facility];  Service: Gynecology;  Laterality: N/A;  . MASS EXCISION  01/2018   abdominal  . OVARIAN CYST REMOVAL    . SALPINGOOPHORECTOMY Bilateral 02/10/2018   Procedure: BILATERAL SALPINGO OOPHORECTOMY; PERITONEAL WASHINGS;  Surgeon: Isabel Caprice, MD;  Location: [facility];  Service: Gynecology;  Laterality: Bilateral;  . TONSILLECTOMY    . TONSILLECTOMY AND ADENOIDECTOMY  1953    There were no vitals filed for this visit.   Subjective Assessment - 04/09/21 1305    Subjective Patient reports she has been walking  outside over the past week and is feeling good about it. No pain today, other than with positional changes. Completing HEP. No other changes since last visit.    Pertinent History Patient report unclear regarding onset. Visited MD on Sept 16 and received injection with relief for approximately 1 month. Reports she cannot receive an additional injection pending eye surgery. Received dx from MD of hip bursitis in B hips. Reports pain with walking, stanindg, sitting 5/10 current 7/10 worst 3/10 best, with temporary relief when she is sitting "just  right" and with naproxen. Agg: activity. Cormorbidities: hx of breast cancer, HTN, MCI, insomnia, cataracts    Limitations Sitting;Standing;Walking;House hold activities    How long can you sit comfortably? Pain constant, reduced with neutral pelvic tilt    How long can you stand comfortably? Pain constant, increases with time    How long can you walk comfortably? Pain constant, increases with time    Patient Stated Goals Be able to move without hurting, walk 1 hour    Pain Onset More than a month ago             INTERVENTION THIS DATE: -Nustep, seat 8, arms 13, Level 3 x6 mintes, SPM>60; 50sec "normal" pace 10sec "fastest" speed x6 - Mini squat with ball slam 3x 6 with cuing for reset between reps to allow for reset of "big" position with arms above head - UE with CLLE forward flex with UEs with 2# AW on LEs and RTB posterior resistance for UE x12 each (attempting alt difficult for coordination) - Amb 270ft with 3# AW and cuing for alt UE big swing with patient able to comply with cuing intermittently; CGA for safety - with 3# AW LE swings ant <> post x12; across body x12          PT Education - 04/09/21 1307    Education provided Yes    Education Details therex form/technique    Person(s) Educated Patient    Methods Explanation;Demonstration;Verbal cues    Comprehension Verbalized understanding;Returned demonstration;Verbal cues required            PT Short Term Goals - 01/14/21 1029      PT SHORT TERM GOAL #1   Title Patient will demonstrate complaince with HEP to speed recovery and reduce total number of visits.    Baseline HEP given; 01/14/2021: Independent with HEP    Time 2    Period Weeks    Status Achieved    Target Date 09/27/19             PT Long Term Goals - 04/02/21 1129      PT LONG TERM GOAL #1   Title Patient will improve hip strength to 4/5 grossly to facilitate improved mobility with ADLs.    Baseline 3, 4-; 12/22 deferred to NV; 11/09/2019:  4-/5; 12/07/2019 L hip flexor 4, abd and ext 3+; 01/29/2020: 4/5 for most hip motions, 3+/5 for abd, ext; 03/20/2020: 4/5 for hip movements    Time 8    Period Weeks    Status Achieved      PT LONG TERM GOAL #2   Title Patient will demonstrate walking tolerance increased to 30 minutes without increase in symptoms to demonstrate improved ability to perform walking program for cardiovascular health.    Baseline 04/02/21 10 minute tolerance (patient report); 12/22 10 minutes (per pt report); 11/09/2019: 7 min; 12/07/2019 unable to assess due to icy streets;  01/29/2020: 10 min; 03/20/2020 10 min; 05/14/2020: 10 min  x 3 per day; 06/11/2020: 69min x 3 per day; 07/15/2020: 64min x 3 per day; 09/02/2020: 72min x 3 (just standing rest breaks in between); 11/06/2019: 10 min x 3    Time 8    Period Weeks    Status On-going      PT LONG TERM GOAL #3   Title Patient will report worst pain of 3/10 during therapy to demonstrate reduced disability    Baseline 04/02/21 Worst 7/10; 12/22 worst 9/10; 11/09/2019: 9/10; 12/07/2019 7/10; 01/29/2020: 7/10; 05/14/2020: 0/10; 03/12/21: 5/10    Time 8    Period Weeks    Status On-going      PT LONG TERM GOAL #4   Title Patient will demonstrate ability to perform all bed mobility maneuvers without increase in pain for reduced disability with mobility and transfers with ADLs.    Baseline Reports pain with bed mobility supine <> sidelying L/R, sidelying to sitting EOB; 12/22 pt able to perform I without increased pain    Time 8    Period Weeks    Status Achieved      PT LONG TERM GOAL #5   Title Patient will improve 10MW speed to 1.0 m/sec to meet norms for minimal fall risk and full community ambulation.    Baseline 04/02/21 0.88 m/sec; 12/22 0.96 m/sec; 11/09/2019: deferred; 12/07/2019 0.90 m/sec; 01/29/2020: .9  m/sec; 03/20/2020: .42m/s; 05/14/2020: . 9 m/s; 06/11/2020: .55m/s; 07/15/2020: 1 m/s; 03/12/21:0.75m/s    Time 8    Period Weeks    Status On-going      PT LONG TERM GOAL #6   Title  Patient will increase 6MWT by 200 ft to demonstrate improvement in functional capacity for transition to cardiac walking program    Baseline 03/12/21 6MWT = 1195; 1/7/20201: deferred; 12/07/2019 1095; 01/29/2020: deferred secondary to fatigue; 03/20/2020: 1056ft; 07/15/2020: 1146ft; 09/03/2020: 861ft; 11/05/2020: 941ft; 01/13/2021: 1014ft    Time 8    Period Weeks    Status On-going      PT LONG TERM GOAL #7   Title Patient will improve her 5xsts to under 15 sec to indicate singificnat improvement in LE functional strength and decrease in fall risk.    Baseline 24.5 sec; 03/20/2020: 19.19 sec; 06/13/2020: 14.92; 03/12/21: 12.91sec    Time 6    Period Weeks    Status Achieved      PT LONG TERM GOAL #8   Title Patient will improve FGA to 28/30 to indicate improvement in dynamic balance and improvement in stability.    Baseline 23/30; 06/11/2020: 25/30; 07/15/2020: 26/30; 09/02/2020: 27/30; 11/05/2020: 28/30    Time 6    Period Weeks    Status Achieved      PT LONG TERM GOAL  #9   TITLE Patient will be able to ascend/desend the stairs without use of UEs to increase safety and decrease fall risk with stair use.    Baseline requires use of UEs; 01/13/2021: able to perform with CGA; 03/12/21: able to perform 4 steps/up and down hands free supervision level    Time 6    Period Weeks    Status Deferred      PT LONG TERM GOAL  #10   TITLE Patient will improve her TUG scores to under 12 sec for normal tug; 15 sec for cognitive TUG to decrease fall risk overall    Baseline 01/13/21: 17.2 Regular ; 27.3 sec cognitive; 5/11 12.22sec regular (cognitive not assessed)    Time 6    Period Weeks  Status Deferred                 Plan - 04/09/21 1312    Clinical Impression Statement PT continued therex progression for increased mobility, with continued focus for increased amplitude and power focus with success. Patient is able to intermittently comply with carry over of increased step length/foot clearance, and  increased arm swing with ambulation, and complies with multimodal cuing for proper technique of therex. Patient is motivatied throughout session. PT will continue progression as able.    Personal Factors and Comorbidities Age;Comorbidity 3+;Time since onset of injury/illness/exacerbation;Comorbidity 2;Other    Comorbidities MCI, HTN, CA, cataracts    Examination-Activity Limitations Bend;Sit;Squat;Stairs;Stand;Sleep    Examination-Participation Restrictions Cleaning;Community Activity    Stability/Clinical Decision Making Stable/Uncomplicated    Clinical Decision Making Moderate    Rehab Potential Fair    Clinical Impairments Affecting Rehab Potential MCI    PT Frequency 2x / week    PT Duration 6 weeks    PT Treatment/Interventions ADLs/Self Care Home Management;Therapeutic exercise;Patient/family education;Neuromuscular re-education;Therapeutic activities;Functional mobility training;Balance training;Manual techniques;Gait training;Stair training;Moist Heat;Electrical Stimulation;Cryotherapy;Taping;Dry needling;Energy conservation;Passive range of motion;Joint Manipulations    PT Next Visit Plan Discuss plan for DC, update HEP as needed; visit approval ends after 5/31    PT Home Exercise Plan no updates this visit    Consulted and Agree with Plan of Care Patient           Patient will benefit from skilled therapeutic intervention in order to improve the following deficits and impairments:  Decreased range of motion,Postural dysfunction,Pain,Abnormal gait,Decreased activity tolerance,Decreased coordination,Decreased endurance,Decreased balance,Decreased mobility,Difficulty walking,Hypomobility,Increased muscle spasms,Impaired perceived functional ability,Decreased strength,Impaired flexibility  Visit Diagnosis: Difficulty in walking, not elsewhere classified  Muscle weakness (generalized)     Problem List Patient Active Problem List   Diagnosis Date Noted  . Osteopenia of neck of  right femur 09/06/2020  . Obesity 08/29/2020  . Parkinson disease ([place]) 02/22/2020  . 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 Bipolar disorder, in full remission, most recent episode depressed ([place]) 11/08/2019  . Avitaminosis D 08/24/2019  . Insomnia due to mental condition 05/31/2019  . Bipolar I disorder, most recent episode depressed ([place]) 05/31/2019  . Cognitive disorder 05/31/2019  . Alcohol use disorder, moderate, in sustained remission ([place]) 05/31/2019  . Elevated tumor markers 05/27/2019  . GAD (generalized anxiety disorder) 04/10/2019  . MDD (major depressive disorder), severe ([place]) 01/31/2019  . High serum carbohydrate antigen 19-9 (CA19-9) 12/01/2018  . Adjustment disorder with anxious mood 11/24/2018  . Essential hypertension 11/24/2018  . B12 deficiency 10/17/2018  . Incisional hernia, without obstruction or gangrene 09/12/2018  . Genetic testing 04/07/2018  . Malignant neoplasm of upper-outer quadrant of right breast in female, estrogen receptor positive ([place]) 03/17/2018  . Malignant neoplasm of upper-inner quadrant of right breast in female, estrogen receptor positive ([place]) 03/17/2018  . History of psychosis 02/07/2018  . History of insomnia 02/07/2018   Durwin Reges DPT Durwin Reges 04/09/2021, 2:05 PM  [facility] [address], [place], [address] Phone: [phone]   Fax:  [phone]  Name: Kendra [name] MRN: [id number] Date of Birth: 1946/07/20

## 2021-04-11 ENCOUNTER — Other Ambulatory Visit: Payer: Self-pay | Admitting: Family Medicine

## 2021-04-14 ENCOUNTER — Telehealth: Payer: Self-pay

## 2021-04-14 NOTE — Telephone Encounter (Signed)
Durwin Reges, PT, with [facility], calling to request an order for referral to Parkinson's therapy - [facility] - in the main clinic be faxed. States Dr. Brita Romp has said it has been sent, but they have not received the referral. Please fax order to [phone].

## 2021-04-15 NOTE — Telephone Encounter (Signed)
Done and ordered faxed

## 2021-04-16 ENCOUNTER — Ambulatory Visit: Payer: Medicare PPO | Admitting: Physical Therapy

## 2021-04-16 ENCOUNTER — Encounter: Payer: Self-pay | Admitting: Physical Therapy

## 2021-04-16 DIAGNOSIS — R262 Difficulty in walking, not elsewhere classified: Secondary | ICD-10-CM | POA: Diagnosis not present

## 2021-04-16 DIAGNOSIS — M6281 Muscle weakness (generalized): Secondary | ICD-10-CM | POA: Diagnosis not present

## 2021-04-16 NOTE — Therapy (Signed)
[facility] [address] [place], [address] Phone: [phone]   Fax:  [phone]  Physical Therapy Treatment  Patient Details  Name: Kendra [name] MRN: [id number] Date of Birth: 27-Jul-1946 No data recorded  Encounter Date: 04/16/2021   PT End of Session - 04/16/21 1319     Visit Number 105    Number of Visits 150    Date for PT Re-Evaluation 06/25/21    Authorization Type Needs [employer] auth    Authorization Time Period 24 visits from 04/02/21- 06/27/21    Authorization - Visit Number 3    Authorization - Number of Visits 24    PT Start Time 6283    PT Stop Time 1345    PT Time Calculation (min) 40 min    Equipment Utilized During Treatment Gait belt    Activity Tolerance Patient tolerated treatment well;No increased pain    Behavior During Therapy [facility] for tasks assessed/performed             Past Medical History:  Diagnosis Date   Anxiety    Breast cancer ([place]) 03/2018   right breast cancer at 11:00 and 1:00   Bursitis    bilateral hips and knees   Complication of anesthesia    Depression    Family history of breast cancer 03/24/2018   Family history of lung cancer 03/24/2018   History of alcohol dependence ([place]) 2007   no ETOH; resolved since 2007   History of hiatal hernia    History of psychosis 2014   due to sleep disturbance   Hypertension    Parkinson disease ([place])    Personal history of radiation therapy 06/2018-07/2018   right breast ca   PONV (postoperative nausea and vomiting)     Past Surgical History:  Procedure Laterality Date   BREAST BIOPSY Right 03/14/2018   11:00 DCIS and invasive ductal carcinoma   BREAST BIOPSY Right 03/14/2018   1:00 Invasive ductal carcinoma   BREAST LUMPECTOMY Right 04/27/2018   lumpectomy of 11 and 1:00 cancers, clear margins, negative LN   BREAST LUMPECTOMY WITH RADIOACTIVE SEED AND SENTINEL LYMPH NODE BIOPSY Right 04/27/2018   Procedure:  RIGHT BREAST LUMPECTOMY WITH RADIOACTIVE SEED X 2 AND RIGHT SENTINEL LYMPH NODE BIOPSY;  Surgeon: [name], MD;  Location: [place];  Service: General;  Laterality: Right;   CATARACT EXTRACTION W/PHACO Left 08/30/2019   Procedure: CATARACT EXTRACTION PHACO AND INTRAOCULAR LENS PLACEMENT ([place]) LEFT panoptix toric  01:20.5  12.7%  10.26;  Surgeon: Leandrew Koyanagi, MD;  Location: [place];  Service: Ophthalmology;  Laterality: Left;   CATARACT EXTRACTION W/PHACO Right 09/20/2019   Procedure: CATARACT EXTRACTION PHACO AND INTRAOCULAR LENS PLACEMENT ([facility]) RIGHT 5.71  00:36.4  15.7%;  Surgeon: Leandrew Koyanagi, MD;  Location: [place];  Service: Ophthalmology;  Laterality: Right;   LAPAROTOMY N/A 02/10/2018   Procedure: EXPLORATORY LAPAROTOMY;  Surgeon: Isabel Caprice, MD;  Location: [facility];  Service: Gynecology;  Laterality: N/A;   MASS EXCISION  01/2018   abdominal   OVARIAN CYST REMOVAL     SALPINGOOPHORECTOMY Bilateral 02/10/2018   Procedure: BILATERAL SALPINGO OOPHORECTOMY; PERITONEAL WASHINGS;  Surgeon: Isabel Caprice, MD;  Location: [facility];  Service: Gynecology;  Laterality: Bilateral;   TONSILLECTOMY     TONSILLECTOMY AND ADENOIDECTOMY  1953    There were no vitals filed for this visit.   Subjective Assessment - 04/16/21 1309     Subjective Patient reports  she is continuing to have some LBP with positional changes, 7/10. No dizziness today. No changes since last visit.    Pertinent History Patient report unclear regarding onset. Visited MD on Sept 16 and received injection with relief for approximately 1 month. Reports she cannot receive an additional injection pending eye surgery. Received dx from MD of hip bursitis in B hips. Reports pain with walking, stanindg, sitting 5/10 current 7/10 worst 3/10 best, with temporary relief when she is sitting "just right" and with naproxen. Agg: activity. Cormorbidities: hx of breast cancer, HTN,  MCI, insomnia, cataracts    Limitations Sitting;Standing;Walking;House hold activities    How long can you sit comfortably? Pain constant, reduced with neutral pelvic tilt    How long can you stand comfortably? Pain constant, increases with time    How long can you walk comfortably? Pain constant, increases with time    Patient Stated Goals Be able to move without hurting, walk 1 hour    Pain Onset More than a month ago               INTERVENTION THIS DATE: -Nustep, seat 8, arms 13, Level 3 x6 mintes, SPM>60; 50sec "normal" pace 10sec "fastest" speed x6 - Alt toe taps on 8in cone x12; 6in coe x12 with cuing for light tap and "big foot lift with good carry over - Amb 220ft with 3# AW and cuing for alt UE big swing with patient able to comply with cuing intermittently; CGA for safety - Seated jack x12 with 1# DB in bilat hands (BUE at 90d flex)  - alt lateral step with cross body reach to cone target; cone target for large step and for reach 2x 12 - with 3# AW alt step forward with CL arm swing with 2# DB CGA for safety, occasional minA for correcting                           PT Education - 04/16/21 1310     Education provided Yes    Education Details therex form/technique    Person(s) Educated Patient    Methods Explanation;Demonstration;Verbal cues    Comprehension Verbalized understanding;Returned demonstration;Verbal cues required              PT Short Term Goals - 01/14/21 1029       PT SHORT TERM GOAL #1   Title Patient will demonstrate complaince with HEP to speed recovery and reduce total number of visits.    Baseline HEP given; 01/14/2021: Independent with HEP    Time 2    Period Weeks    Status Achieved    Target Date 09/27/19               PT Long Term Goals - 04/02/21 1129       PT LONG TERM GOAL #1   Title Patient will improve hip strength to 4/5 grossly to facilitate improved mobility with ADLs.    Baseline 3, 4-; 12/22  deferred to [place]; 11/09/2019: 4-/5; 12/07/2019 L hip flexor 4, abd and ext 3+; 01/29/2020: 4/5 for most hip motions, 3+/5 for abd, ext; 03/20/2020: 4/5 for hip movements    Time 8    Period Weeks    Status Achieved      PT LONG TERM GOAL #2   Title Patient will demonstrate walking tolerance increased to 30 minutes without increase in symptoms to demonstrate improved ability to perform walking program for cardiovascular health.  Baseline 04/02/21 10 minute tolerance (patient report); 12/22 10 minutes (per pt report); 11/09/2019: 7 min; 12/07/2019 unable to assess due to icy streets;  01/29/2020: 10 min; 03/20/2020 10 min; 05/14/2020: 10 min x 3 per day; 06/11/2020: 31min x 3 per day; 07/15/2020: 33min x 3 per day; 09/02/2020: 39min x 3 (just standing rest breaks in between); 11/06/2019: 10 min x 3    Time 8    Period Weeks    Status On-going      PT LONG TERM GOAL #3   Title Patient will report worst pain of 3/10 during therapy to demonstrate reduced disability    Baseline 04/02/21 Worst 7/10; 12/22 worst 9/10; 11/09/2019: 9/10; 12/07/2019 7/10; 01/29/2020: 7/10; 05/14/2020: 0/10; 03/12/21: 5/10    Time 8    Period Weeks    Status On-going      PT LONG TERM GOAL #4   Title Patient will demonstrate ability to perform all bed mobility maneuvers without increase in pain for reduced disability with mobility and transfers with ADLs.    Baseline Reports pain with bed mobility supine <> sidelying L/R, sidelying to sitting EOB; 12/22 pt able to perform I without increased pain    Time 8    Period Weeks    Status Achieved      PT LONG TERM GOAL #5   Title Patient will improve 10MW speed to 1.0 m/sec to meet norms for minimal fall risk and full community ambulation.    Baseline 04/02/21 0.88 m/sec; 12/22 0.96 m/sec; 11/09/2019: deferred; 12/07/2019 0.90 m/sec; 01/29/2020: .9  m/sec; 03/20/2020: .17m/s; 05/14/2020: . 9 m/s; 06/11/2020: .21m/s; 07/15/2020: 1 m/s; 03/12/21:0.68m/s    Time 8    Period Weeks    Status On-going      PT LONG  TERM GOAL #6   Title Patient will increase 6MWT by 200 ft to demonstrate improvement in functional capacity for transition to cardiac walking program    Baseline 03/12/21 6MWT = 1195; 1/7/20201: deferred; 12/07/2019 1095; 01/29/2020: deferred secondary to fatigue; 03/20/2020: 1019ft; 07/15/2020: 1110ft; 09/03/2020: 8107ft; 11/05/2020: 933ft; 01/13/2021: 1059ft    Time 8    Period Weeks    Status On-going      PT LONG TERM GOAL #7   Title Patient will improve her 5xsts to under 15 sec to indicate singificnat improvement in LE functional strength and decrease in fall risk.    Baseline 24.5 sec; 03/20/2020: 19.19 sec; 06/13/2020: 14.92; 03/12/21: 12.91sec    Time 6    Period Weeks    Status Achieved      PT LONG TERM GOAL #8   Title Patient will improve FGA to 28/30 to indicate improvement in dynamic balance and improvement in stability.    Baseline 23/30; 06/11/2020: 25/30; 07/15/2020: 26/30; 09/02/2020: 27/30; 11/05/2020: 28/30    Time 6    Period Weeks    Status Achieved      PT LONG TERM GOAL  #9   TITLE Patient will be able to ascend/desend the stairs without use of UEs to increase safety and decrease fall risk with stair use.    Baseline requires use of UEs; 01/13/2021: able to perform with CGA; 03/12/21: able to perform 4 steps/up and down hands free supervision level    Time 6    Period Weeks    Status Deferred      PT LONG TERM GOAL  #10   TITLE Patient will improve her TUG scores to under 12 sec for normal tug; 15 sec for cognitive TUG  to decrease fall risk overall    Baseline 01/13/21: 17.2 Regular ; 27.3 sec cognitive; 5/11 12.22sec regular (cognitive not assessed)    Time 6    Period Weeks    Status Deferred                   Plan - 04/16/21 1329     Clinical Impression Statement PT continued therex progression for increased mobility with continued focus on increased amplitude and power with success. Patient is able to comply with all cuing for proper technique of therex, requires  multimodal cuing for understanding, and gaurding for safety. Patient is motivated throughout session without pain. Does have predicted fatigue, cuing for breath control during rest. PT will continue progression as able.    Personal Factors and Comorbidities Age;Comorbidity 3+;Time since onset of injury/illness/exacerbation;Comorbidity 2;Other    Comorbidities MCI, HTN, CA, cataracts    Examination-Activity Limitations Bend;Sit;Squat;Stairs;Stand;Sleep    Examination-Participation Restrictions Cleaning;Community Activity    Stability/Clinical Decision Making Stable/Uncomplicated    Clinical Decision Making Moderate    Rehab Potential Fair    Clinical Impairments Affecting Rehab Potential MCI    PT Frequency 2x / week    PT Duration 6 weeks    PT Treatment/Interventions ADLs/Self Care Home Management;Therapeutic exercise;Patient/family education;Neuromuscular re-education;Therapeutic activities;Functional mobility training;Balance training;Manual techniques;Gait training;Stair training;Moist Heat;Electrical Stimulation;Cryotherapy;Taping;Dry needling;Energy conservation;Passive range of motion;Joint Manipulations    PT Next Visit Plan Discuss plan for DC, update HEP as needed; visit approval ends after 5/31    PT Home Exercise Plan no updates this visit    Consulted and Agree with Plan of Care Patient             Patient will benefit from skilled therapeutic intervention in order to improve the following deficits and impairments:  Decreased range of motion, Postural dysfunction, Pain, Abnormal gait, Decreased activity tolerance, Decreased coordination, Decreased endurance, Decreased balance, Decreased mobility, Difficulty walking, Hypomobility, Increased muscle spasms, Impaired perceived functional ability, Decreased strength, Impaired flexibility  Visit Diagnosis: Difficulty in walking, not elsewhere classified  Muscle weakness (generalized)     Problem List Patient Active Problem  List   Diagnosis Date Noted   Osteopenia of neck of right femur 09/06/2020   Obesity 08/29/2020   Parkinson disease ([place]) 02/22/2020   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   Bipolar disorder, in full remission, most recent episode depressed ([place]) 11/08/2019   Avitaminosis D 08/24/2019   Insomnia due to mental condition 05/31/2019   Bipolar I disorder, most recent episode depressed ([place]) 05/31/2019   Cognitive disorder 05/31/2019   Alcohol use disorder, moderate, in sustained remission ([place]) 05/31/2019   Elevated tumor markers 05/27/2019   GAD (generalized anxiety disorder) 04/10/2019   MDD (major depressive disorder), severe ([place]) 01/31/2019   High serum carbohydrate antigen 19-9 (CA19-9) 12/01/2018   Adjustment disorder with anxious mood 11/24/2018   Essential hypertension 11/24/2018   B12 deficiency 10/17/2018   Incisional hernia, without obstruction or gangrene 09/12/2018   Genetic testing 04/07/2018   Malignant neoplasm of upper-outer quadrant of right breast in female, estrogen receptor positive ([place]) 03/17/2018   Malignant neoplasm of upper-inner quadrant of right breast in female, estrogen receptor positive ([place]) 03/17/2018   History of psychosis 02/07/2018   History of insomnia 02/07/2018   Durwin Reges DPT Durwin Reges 04/16/2021, 1:46 PM  [facility] [address] [place], [address] Phone: [phone]   Fax:  [phone]  Name: Kendra [name] MRN: [id number] Date of Birth: October 18, 1946

## 2021-04-21 DIAGNOSIS — H40003 Preglaucoma, unspecified, bilateral: Secondary | ICD-10-CM | POA: Diagnosis not present

## 2021-04-22 ENCOUNTER — Other Ambulatory Visit: Payer: Self-pay

## 2021-04-22 ENCOUNTER — Ambulatory Visit: Payer: Medicare PPO | Admitting: Physical Therapy

## 2021-04-22 ENCOUNTER — Encounter: Payer: Self-pay | Admitting: Physical Therapy

## 2021-04-22 DIAGNOSIS — R262 Difficulty in walking, not elsewhere classified: Secondary | ICD-10-CM

## 2021-04-22 DIAGNOSIS — M6281 Muscle weakness (generalized): Secondary | ICD-10-CM | POA: Diagnosis not present

## 2021-04-22 NOTE — Therapy (Signed)
[facility] [address], [place], [address] Phone: [phone]   Fax:  [phone]  Physical Therapy Treatment  Patient Details  Name: Kendra [name] MRN: [id number] Date of Birth: Feb 20, 1946 No data recorded  Encounter Date: 04/22/2021   PT End of Session - 04/22/21 1329     Visit Number 106    Number of Visits 150    Date for PT Re-Evaluation 06/25/21    Authorization Type Needs [employer] auth    Authorization Time Period 24 visits from 04/02/21- 06/27/21    Authorization - Visit Number 4    Authorization - Number of Visits 24    PT Start Time 1300    PT Stop Time 1345    PT Time Calculation (min) 45 min    Equipment Utilized During Treatment Gait belt    Activity Tolerance Patient tolerated treatment well;No increased pain    Behavior During Therapy [facility] for tasks assessed/performed             Past Medical History:  Diagnosis Date   Anxiety    Breast cancer ([place]) 03/2018   right breast cancer at 11:00 and 1:00   Bursitis    bilateral hips and knees   Complication of anesthesia    Depression    Family history of breast cancer 03/24/2018   Family history of lung cancer 03/24/2018   History of alcohol dependence ([place]) 2007   no ETOH; resolved since 2007   History of hiatal hernia    History of psychosis 2014   due to sleep disturbance   Hypertension    Parkinson disease ([place])    Personal history of radiation therapy 06/2018-07/2018   right breast ca   PONV (postoperative nausea and vomiting)     Past Surgical History:  Procedure Laterality Date   BREAST BIOPSY Right 03/14/2018   11:00 DCIS and invasive ductal carcinoma   BREAST BIOPSY Right 03/14/2018   1:00 Invasive ductal carcinoma   BREAST LUMPECTOMY Right 04/27/2018   lumpectomy of 11 and 1:00 cancers, clear margins, negative LN   BREAST LUMPECTOMY WITH RADIOACTIVE SEED AND SENTINEL LYMPH NODE BIOPSY Right 04/27/2018   Procedure:  RIGHT BREAST LUMPECTOMY WITH RADIOACTIVE SEED X 2 AND RIGHT SENTINEL LYMPH NODE BIOPSY;  Surgeon: [name], MD;  Location: [facility];  Service: General;  Laterality: Right;   CATARACT EXTRACTION W/PHACO Left 08/30/2019   Procedure: CATARACT EXTRACTION PHACO AND INTRAOCULAR LENS PLACEMENT ([facility]) LEFT panoptix toric  01:20.5  12.7%  10.26;  Surgeon: Leandrew Koyanagi, MD;  Location: [place];  Service: Ophthalmology;  Laterality: Left;   CATARACT EXTRACTION W/PHACO Right 09/20/2019   Procedure: CATARACT EXTRACTION PHACO AND INTRAOCULAR LENS PLACEMENT ([facility]) RIGHT 5.71  00:36.4  15.7%;  Surgeon: Leandrew Koyanagi, MD;  Location: [facility];  Service: Ophthalmology;  Laterality: Right;   LAPAROTOMY N/A 02/10/2018   Procedure: EXPLORATORY LAPAROTOMY;  Surgeon: Isabel Caprice, MD;  Location: [facility];  Service: Gynecology;  Laterality: N/A;   MASS EXCISION  01/2018   abdominal   OVARIAN CYST REMOVAL     SALPINGOOPHORECTOMY Bilateral 02/10/2018   Procedure: BILATERAL SALPINGO OOPHORECTOMY; PERITONEAL WASHINGS;  Surgeon: Isabel Caprice, MD;  Location: [facility];  Service: Gynecology;  Laterality: Bilateral;   TONSILLECTOMY     TONSILLECTOMY AND ADENOIDECTOMY  1953    There were no vitals filed for this visit.   Subjective Assessment - 04/22/21 1302     Subjective Reports her  back is still bothering her when changing positions. Reports HEP of side stepping in the kitchen, 55mins of walking/day with approx 3 standing rests, forward and backward steps. Has LSVT appt in Sept would like to maintain PT until then. Pt denies further dizziness symptoms    Pertinent History Patient report unclear regarding onset. Visited MD on Sept 16 and received injection with relief for approximately 1 month. Reports she cannot receive an additional injection pending eye surgery. Received dx from MD of hip bursitis in B hips. Reports pain with walking, stanindg, sitting 5/10  current 7/10 worst 3/10 best, with temporary relief when she is sitting "just right" and with naproxen. Agg: activity. Cormorbidities: hx of breast cancer, HTN, MCI, insomnia, cataracts    Limitations Sitting;Standing;Walking;House hold activities    How long can you sit comfortably? Pain constant, reduced with neutral pelvic tilt    How long can you stand comfortably? Pain constant, increases with time    How long can you walk comfortably? Pain constant, increases with time    Patient Stated Goals Be able to move without hurting, walk 1 hour    Pain Onset More than a month ago             INTERVENTION THIS DATE: -Nustep, seat 8, arms 13, Level 3 x6 mintes, SPM>60; 50sec "normal" pace 10sec "fastest" speed x6  PT reviewed the following HEP with patient with patient able to demonstrate a set of the following with min cuing for correction needed. PT educated patient on parameters of therex (how/when to inc/decrease intensity, frequency, rep/set range, stretch hold time, and purpose of therex) with verbalized understanding.  Access Code: [email or web address] Side Stepping with Counter Support - 1 x daily - 2-3 x weekly - 3 sets - 12 reps Standing March with Counter Support - 1 x daily - 2-3 x weekly - 3 sets - 12 reps Squat with Medicine [employer] - 1 x daily - 2-3 x weekly - 3 sets - 12 reps  - Amb 245ft with 3# AW and cuing for alt UE big swing with patient able to comply with cuing intermittently; CGA for safety - again above with head turns (called out by PT at random) CGA for safety; reports minimal dizziness, more limited by fatigue  Seated half jacks x12 with good carry over of mirroring        PT Education - 04/22/21 1329     Education provided Yes    Education Details therex form/technique, HEP    Person(s) Educated Patient    Methods Explanation;Demonstration;Verbal cues    Comprehension Verbalized understanding;Returned demonstration;Verbal cues required               PT Short Term Goals - 01/14/21 1029       PT SHORT TERM GOAL #1   Title Patient will demonstrate complaince with HEP to speed recovery and reduce total number of visits.    Baseline HEP given; 01/14/2021: Independent with HEP    Time 2    Period Weeks    Status Achieved    Target Date 09/27/19               PT Long Term Goals - 04/02/21 1129       PT LONG TERM GOAL #1   Title Patient will improve hip strength to 4/5 grossly to facilitate improved mobility with ADLs.    Baseline 3, 4-; 12/22 deferred to NV; 11/09/2019: 4-/5; 12/07/2019 L hip flexor 4, abd and ext 3+;  01/29/2020: 4/5 for most hip motions, 3+/5 for abd, ext; 03/20/2020: 4/5 for hip movements    Time 8    Period Weeks    Status Achieved      PT LONG TERM GOAL #2   Title Patient will demonstrate walking tolerance increased to 30 minutes without increase in symptoms to demonstrate improved ability to perform walking program for cardiovascular health.    Baseline 04/02/21 10 minute tolerance (patient report); 12/22 10 minutes (per pt report); 11/09/2019: 7 min; 12/07/2019 unable to assess due to icy streets;  01/29/2020: 10 min; 03/20/2020 10 min; 05/14/2020: 10 min x 3 per day; 06/11/2020: 25min x 3 per day; 07/15/2020: 60min x 3 per day; 09/02/2020: 72min x 3 (just standing rest breaks in between); 11/06/2019: 10 min x 3    Time 8    Period Weeks    Status On-going      PT LONG TERM GOAL #3   Title Patient will report worst pain of 3/10 during therapy to demonstrate reduced disability    Baseline 04/02/21 Worst 7/10; 12/22 worst 9/10; 11/09/2019: 9/10; 12/07/2019 7/10; 01/29/2020: 7/10; 05/14/2020: 0/10; 03/12/21: 5/10    Time 8    Period Weeks    Status On-going      PT LONG TERM GOAL #4   Title Patient will demonstrate ability to perform all bed mobility maneuvers without increase in pain for reduced disability with mobility and transfers with ADLs.    Baseline Reports pain with bed mobility supine <> sidelying L/R, sidelying to  sitting EOB; 12/22 pt able to perform I without increased pain    Time 8    Period Weeks    Status Achieved      PT LONG TERM GOAL #5   Title Patient will improve 10MW speed to 1.0 m/sec to meet norms for minimal fall risk and full community ambulation.    Baseline 04/02/21 0.88 m/sec; 12/22 0.96 m/sec; 11/09/2019: deferred; 12/07/2019 0.90 m/sec; 01/29/2020: .9  m/sec; 03/20/2020: .56m/s; 05/14/2020: . 9 m/s; 06/11/2020: .59m/s; 07/15/2020: 1 m/s; 03/12/21:0.21m/s    Time 8    Period Weeks    Status On-going      PT LONG TERM GOAL #6   Title Patient will increase 6MWT by 200 ft to demonstrate improvement in functional capacity for transition to cardiac walking program    Baseline 03/12/21 6MWT = 1195; 1/7/20201: deferred; 12/07/2019 1095; 01/29/2020: deferred secondary to fatigue; 03/20/2020: 1078ft; 07/15/2020: 1166ft; 09/03/2020: 810ft; 11/05/2020: 967ft; 01/13/2021: 1079ft    Time 8    Period Weeks    Status On-going      PT LONG TERM GOAL #7   Title Patient will improve her 5xsts to under 15 sec to indicate singificnat improvement in LE functional strength and decrease in fall risk.    Baseline 24.5 sec; 03/20/2020: 19.19 sec; 06/13/2020: 14.92; 03/12/21: 12.91sec    Time 6    Period Weeks    Status Achieved      PT LONG TERM GOAL #8   Title Patient will improve FGA to 28/30 to indicate improvement in dynamic balance and improvement in stability.    Baseline 23/30; 06/11/2020: 25/30; 07/15/2020: 26/30; 09/02/2020: 27/30; 11/05/2020: 28/30    Time 6    Period Weeks    Status Achieved      PT LONG TERM GOAL  #9   TITLE Patient will be able to ascend/desend the stairs without use of UEs to increase safety and decrease fall risk with stair use.  Baseline requires use of UEs; 01/13/2021: able to perform with CGA; 03/12/21: able to perform 4 steps/up and down hands free supervision level    Time 6    Period Weeks    Status Deferred      PT LONG TERM GOAL  #10   TITLE Patient will improve her TUG scores to  under 12 sec for normal tug; 15 sec for cognitive TUG to decrease fall risk overall    Baseline 01/13/21: 17.2 Regular ; 27.3 sec cognitive; 5/11 12.22sec regular (cognitive not assessed)    Time 6    Period Weeks    Status Deferred                   Plan - 04/22/21 1343     Clinical Impression Statement PT continued therex progression for increased mobility with continued focus on amplitude and power. PT updated HEP with patient verbalizing and demonstrating good understanding, for increased independence with therapy. Pt with no increased dizziness with head turning, mostly limited in ambulation by fatigue. Patient with short, shallow breathing as well, somewhat able to be cued out of this. Pt is able to comply with multimodal cuing for proper technique of all therex with good motivation throughout session. PT will continue progression as able.    Personal Factors and Comorbidities Age;Comorbidity 3+;Time since onset of injury/illness/exacerbation;Comorbidity 2;Other    Comorbidities MCI, HTN, CA, cataracts    Examination-Activity Limitations Bend;Sit;Squat;Stairs;Stand;Sleep    Examination-Participation Restrictions Cleaning;Community Activity    Stability/Clinical Decision Making Stable/Uncomplicated    Clinical Decision Making Moderate    Rehab Potential Fair    Clinical Impairments Affecting Rehab Potential MCI    PT Frequency 2x / week    PT Duration 6 weeks    PT Treatment/Interventions ADLs/Self Care Home Management;Therapeutic exercise;Patient/family education;Neuromuscular re-education;Therapeutic activities;Functional mobility training;Balance training;Manual techniques;Gait training;Stair training;Moist Heat;Electrical Stimulation;Cryotherapy;Taping;Dry needling;Energy conservation;Passive range of motion;Joint Manipulations    PT Next Visit Plan Discuss plan for DC, update HEP as needed; visit approval ends after 5/31    PT Home Exercise Plan no updates this visit     Consulted and Agree with Plan of Care Patient             Patient will benefit from skilled therapeutic intervention in order to improve the following deficits and impairments:  Decreased range of motion, Postural dysfunction, Pain, Abnormal gait, Decreased activity tolerance, Decreased coordination, Decreased endurance, Decreased balance, Decreased mobility, Difficulty walking, Hypomobility, Increased muscle spasms, Impaired perceived functional ability, Decreased strength, Impaired flexibility  Visit Diagnosis: Difficulty in walking, not elsewhere classified  Muscle weakness (generalized)     Problem List Patient Active Problem List   Diagnosis Date Noted   Osteopenia of neck of right femur 09/06/2020   Obesity 08/29/2020   Parkinson disease ([place]) 02/22/2020   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   Bipolar disorder, in full remission, most recent episode depressed ([place]) 11/08/2019   Avitaminosis D 08/24/2019   Insomnia due to mental condition 05/31/2019   Bipolar I disorder, most recent episode depressed ([place]) 05/31/2019   Cognitive disorder 05/31/2019   Alcohol use disorder, moderate, in sustained remission ([place]) 05/31/2019   Elevated tumor markers 05/27/2019   GAD (generalized anxiety disorder) 04/10/2019   MDD (major depressive disorder), severe ([place]) 01/31/2019   High serum carbohydrate antigen 19-9 (CA19-9) 12/01/2018   Adjustment disorder with anxious mood 11/24/2018   Essential hypertension 11/24/2018   B12 deficiency 10/17/2018   Incisional hernia, without obstruction or gangrene 09/12/2018   Genetic testing 04/07/2018  Malignant neoplasm of upper-outer quadrant of right breast in female, estrogen receptor positive ([place]) 03/17/2018   Malignant neoplasm of upper-inner quadrant of right breast in female, estrogen receptor positive ([place]) 03/17/2018   History of psychosis 02/07/2018   History of insomnia 02/07/2018    Durwin Reges DPT Durwin Reges 04/22/2021,  2:11 PM  [facility] [address], [place], [address] Phone: [phone]   Fax:  [phone]  Name: Kendra [name] MRN: [id number] Date of Birth: 07/31/1946

## 2021-04-23 ENCOUNTER — Encounter: Payer: Medicare PPO | Admitting: Physical Therapy

## 2021-04-24 ENCOUNTER — Other Ambulatory Visit: Payer: Self-pay

## 2021-04-24 ENCOUNTER — Ambulatory Visit (INDEPENDENT_AMBULATORY_CARE_PROVIDER_SITE_OTHER): Payer: Medicare PPO | Admitting: Licensed Clinical Social Worker

## 2021-04-24 DIAGNOSIS — F3176 Bipolar disorder, in full remission, most recent episode depressed: Secondary | ICD-10-CM

## 2021-04-24 NOTE — Progress Notes (Signed)
Virtual Visit via Video Note  I connected with Kendra [name] on 04/24/21 at  1:00 PM EDT by a video enabled telemedicine application and verified that I am speaking with the correct person using two identifiers.  *pt 15 min late due to being in another appointment prior to session  Location: Patient: home Provider: remote office [place], [place])   I discussed the limitations of evaluation and management by telemedicine and the availability of in person appointments. The patient expressed understanding and agreed to proceed.   I discussed the assessment and treatment plan with the patient. The patient was provided an opportunity to ask questions and all were answered. The patient agreed with the plan and demonstrated an understanding of the instructions.   The patient was advised to call back or seek an in-person evaluation if the symptoms worsen or if the condition fails to improve as anticipated.  I provided 45 minutes of non-face-to-face time during this encounter.   Kendra [name] R Kendra Capano, LCSW  THERAPIST PROGRESS NOTE  Session Time: 1:15-2p  Participation Level: Active  Behavioral Response: NAConfusedAnxious  Type of Therapy: Individual Therapy  Treatment Goals addressed: Anxiety and Coping  Interventions: Supportive  Summary: Kendra [name] is a 75 y.o. female who presents with improving symptoms related to bipolar disorder diagnosis. Pt reports that overall mood has been stable.  Kendra [name] did report that she feels Parkinson's symptoms are escalating (aches, physical instability, disorganized thoughts).  Pt also reporting insomnia at times.  Discussed pts overall management of health--pt is still going to all doctor appointments, is compliant with medications, and is communicating well to health care providers. Pt is focusing on self care and has a very positive outlook. Pt spends quality time with husband: enjoys watching sports on TV with him. Pt and husband attend painting  classes routinely and go for walks if pt is having a good day, physically. Pt also enjoys watching wheel of fortune and Jeopardy with husband. Pt reports that she has not fallen recently and is not as dizzy as she has been in the past. Pt believes this is improved due to her diligence with physical therapy.   Pt had trouble with thought blocking and some derailment. LCSW counselor helped pt stay on topic and helped trigger recollection of topics being discussed.   Continued recommendations are as follows: self care behaviors, positive social engagements, focusing on overall work/home/life balance, and focusing on positive physical and emotional wellness.   Suicidal/Homicidal: No  Therapist Response: Kendra [name] is continuing to identify anxiety triggers and utilize strategies that have been helpful in the past to manage symptoms. Kendra [name] is continuing to develop healthy cognitive patterns and is participating in regular social activities/conversations that reflect increasing energy, interest, and is a tool to help trigger cognitive stimulation. Pts choices reflect personal growth and progress. Treatment to continue as indicated.   Plan: Return again in 4 weeks.  Diagnosis: Axis I: Bipolar, Depressed    Axis II: No diagnosis    Kendra [name] Kendra Beyl, LCSW 04/24/2021

## 2021-04-28 ENCOUNTER — Ambulatory Visit: Payer: Medicare PPO | Admitting: Physical Therapy

## 2021-04-28 ENCOUNTER — Encounter: Payer: Self-pay | Admitting: Physical Therapy

## 2021-04-28 VITALS — HR 95

## 2021-04-28 DIAGNOSIS — R262 Difficulty in walking, not elsewhere classified: Secondary | ICD-10-CM

## 2021-04-28 DIAGNOSIS — M6281 Muscle weakness (generalized): Secondary | ICD-10-CM | POA: Diagnosis not present

## 2021-04-28 NOTE — Therapy (Signed)
[facility] [address] [place], [address] Phone: [phone]   Fax:  [phone]  Physical Therapy Treatment  Patient Details  Name: Kendra [name] MRN: [id number] Date of Birth: 1946-09-03 No data recorded  Encounter Date: 04/28/2021   PT End of Session - 04/28/21 1435     Visit Number 107    Number of Visits 150    Date for PT Re-Evaluation 06/25/21    Authorization Type Needs [employer] auth    Authorization Time Period 24 visits from 04/02/21- 06/27/21    Authorization - Visit Number 5    Authorization - Number of Visits 24    PT Start Time 1740    PT Stop Time 1430    PT Time Calculation (min) 42 min    Equipment Utilized During Treatment Gait belt    Activity Tolerance Patient tolerated treatment well;No increased pain    Behavior During Therapy [facility] for tasks assessed/performed             Past Medical History:  Diagnosis Date   Anxiety    Breast cancer ([place]) 03/2018   right breast cancer at 11:00 and 1:00   Bursitis    bilateral hips and knees   Complication of anesthesia    Depression    Family history of breast cancer 03/24/2018   Family history of lung cancer 03/24/2018   History of alcohol dependence ([place]) 2007   no ETOH; resolved since 2007   History of hiatal hernia    History of psychosis 2014   due to sleep disturbance   Hypertension    Parkinson disease ([place])    Personal history of radiation therapy 06/2018-07/2018   right breast ca   PONV (postoperative nausea and vomiting)     Past Surgical History:  Procedure Laterality Date   BREAST BIOPSY Right 03/14/2018   11:00 DCIS and invasive ductal carcinoma   BREAST BIOPSY Right 03/14/2018   1:00 Invasive ductal carcinoma   BREAST LUMPECTOMY Right 04/27/2018   lumpectomy of 11 and 1:00 cancers, clear margins, negative LN   BREAST LUMPECTOMY WITH RADIOACTIVE SEED AND SENTINEL LYMPH NODE BIOPSY Right 04/27/2018   Procedure:  RIGHT BREAST LUMPECTOMY WITH RADIOACTIVE SEED X 2 AND RIGHT SENTINEL LYMPH NODE BIOPSY;  Surgeon: [name], MD;  Location: [facility];  Service: General;  Laterality: Right;   CATARACT EXTRACTION W/PHACO Left 08/30/2019   Procedure: CATARACT EXTRACTION PHACO AND INTRAOCULAR LENS PLACEMENT ([place]) LEFT panoptix toric  01:20.5  12.7%  10.26;  Surgeon: Leandrew Koyanagi, MD;  Location: [facility];  Service: Ophthalmology;  Laterality: Left;   CATARACT EXTRACTION W/PHACO Right 09/20/2019   Procedure: CATARACT EXTRACTION PHACO AND INTRAOCULAR LENS PLACEMENT ([facility]) RIGHT 5.71  00:36.4  15.7%;  Surgeon: Leandrew Koyanagi, MD;  Location: [facility];  Service: Ophthalmology;  Laterality: Right;   LAPAROTOMY N/A 02/10/2018   Procedure: EXPLORATORY LAPAROTOMY;  Surgeon: Isabel Caprice, MD;  Location: [facility];  Service: Gynecology;  Laterality: N/A;   MASS EXCISION  01/2018   abdominal   OVARIAN CYST REMOVAL     SALPINGOOPHORECTOMY Bilateral 02/10/2018   Procedure: BILATERAL SALPINGO OOPHORECTOMY; PERITONEAL WASHINGS;  Surgeon: Isabel Caprice, MD;  Location: [facility];  Service: Gynecology;  Laterality: Bilateral;   TONSILLECTOMY     TONSILLECTOMY AND ADENOIDECTOMY  1953    Vitals:   04/28/21 1420  Pulse: 95  SpO2: 96%     Subjective Assessment - 04/28/21 1450  Subjective Reports her back is still bothering her when changing positions. Reports she took some tylenol for pain in thigh and low back. Has LSVT appt in Sept would like to maintain PT until then. Pt denies further dizziness symptoms.                                       PT Education - 04/28/21 1449     Education Details Patient educated again in [place] and therex form/technique    Person(s) Educated Patient    Methods Explanation;Demonstration    Comprehension Verbalized understanding;Returned demonstration              PT Short Term Goals - 01/14/21 1029        PT SHORT TERM GOAL #1   Title Patient will demonstrate complaince with HEP to speed recovery and reduce total number of visits.    Baseline HEP given; 01/14/2021: Independent with HEP    Time 2    Period Weeks    Status Achieved    Target Date 09/27/19               PT Long Term Goals - 04/02/21 1129       PT LONG TERM GOAL #1   Title Patient will improve hip strength to 4/5 grossly to facilitate improved mobility with ADLs.    Baseline 3, 4-; 12/22 deferred to [place]; 11/09/2019: 4-/5; 12/07/2019 L hip flexor 4, abd and ext 3+; 01/29/2020: 4/5 for most hip motions, 3+/5 for abd, ext; 03/20/2020: 4/5 for hip movements    Time 8    Period Weeks    Status Achieved      PT LONG TERM GOAL #2   Title Patient will demonstrate walking tolerance increased to 30 minutes without increase in symptoms to demonstrate improved ability to perform walking program for cardiovascular health.    Baseline 04/02/21 10 minute tolerance (patient report); 12/22 10 minutes (per pt report); 11/09/2019: 7 min; 12/07/2019 unable to assess due to icy streets;  01/29/2020: 10 min; 03/20/2020 10 min; 05/14/2020: 10 min x 3 per day; 06/11/2020: 3min x 3 per day; 07/15/2020: 72min x 3 per day; 09/02/2020: 52min x 3 (just standing rest breaks in between); 11/06/2019: 10 min x 3    Time 8    Period Weeks    Status On-going      PT LONG TERM GOAL #3   Title Patient will report worst pain of 3/10 during therapy to demonstrate reduced disability    Baseline 04/02/21 Worst 7/10; 12/22 worst 9/10; 11/09/2019: 9/10; 12/07/2019 7/10; 01/29/2020: 7/10; 05/14/2020: 0/10; 03/12/21: 5/10    Time 8    Period Weeks    Status On-going      PT LONG TERM GOAL #4   Title Patient will demonstrate ability to perform all bed mobility maneuvers without increase in pain for reduced disability with mobility and transfers with ADLs.    Baseline Reports pain with bed mobility supine <> sidelying L/R, sidelying to sitting EOB; 12/22 pt able to perform I  without increased pain    Time 8    Period Weeks    Status Achieved      PT LONG TERM GOAL #5   Title Patient will improve 10MW speed to 1.0 m/sec to meet norms for minimal fall risk and full community ambulation.    Baseline 04/02/21 0.88 m/sec; 12/22 0.96 m/sec; 11/09/2019: deferred; 12/07/2019  0.90 m/sec; 01/29/2020: .9  m/sec; 03/20/2020: .38m/s; 05/14/2020: . 9 m/s; 06/11/2020: .21m/s; 07/15/2020: 1 m/s; 03/12/21:0.61m/s    Time 8    Period Weeks    Status On-going      PT LONG TERM GOAL #6   Title Patient will increase 6MWT by 200 ft to demonstrate improvement in functional capacity for transition to cardiac walking program    Baseline 03/12/21 6MWT = 1195; 1/7/20201: deferred; 12/07/2019 1095; 01/29/2020: deferred secondary to fatigue; 03/20/2020: 1073ft; 07/15/2020: 1136ft; 09/03/2020: 8106ft; 11/05/2020: 970ft; 01/13/2021: 1045ft    Time 8    Period Weeks    Status On-going      PT LONG TERM GOAL #7   Title Patient will improve her 5xsts to under 15 sec to indicate singificnat improvement in LE functional strength and decrease in fall risk.    Baseline 24.5 sec; 03/20/2020: 19.19 sec; 06/13/2020: 14.92; 03/12/21: 12.91sec    Time 6    Period Weeks    Status Achieved      PT LONG TERM GOAL #8   Title Patient will improve FGA to 28/30 to indicate improvement in dynamic balance and improvement in stability.    Baseline 23/30; 06/11/2020: 25/30; 07/15/2020: 26/30; 09/02/2020: 27/30; 11/05/2020: 28/30    Time 6    Period Weeks    Status Achieved      PT LONG TERM GOAL  #9   TITLE Patient will be able to ascend/desend the stairs without use of UEs to increase safety and decrease fall risk with stair use.    Baseline requires use of UEs; 01/13/2021: able to perform with CGA; 03/12/21: able to perform 4 steps/up and down hands free supervision level    Time 6    Period Weeks    Status Deferred      PT LONG TERM GOAL  #10   TITLE Patient will improve her TUG scores to under 12 sec for normal tug; 15 sec for  cognitive TUG to decrease fall risk overall    Baseline 01/13/21: 17.2 Regular ; 27.3 sec cognitive; 5/11 12.22sec regular (cognitive not assessed)    Time 6    Period Weeks    Status Deferred             -Nustep, seat 8, arms 10, Level 4 x 5 mintes, SPM>40   Access Code: [email or web address] Side Stepping with Counter Support - 1 x daily - 2-3 x weekly - 3 sets - 12 reps Standing March with Counter Support - 1 x daily - 2-3 x weekly - 3 sets - 12 reps Squat with Medicine [employer] - 1 x daily - 2-3 x weekly - 3 sets - 12 reps Seated half jacks 2 x 12 with good carry over of mirroring  Standing step out with unilateral arm raises x 45 sec Standing step across with contralateral reaches x 45 sec        Plan - 04/28/21 1438     Clinical Impression Statement PT continued therex progression and review of HEP in order to increase retention at home. Pt visits have been reduced to 1x/2 weeks. Pt needed multimodal cueing and minimal assistance to maintain balance/form during exercise. Pt did experience some dizziness during sessin. Pt tolerated session well and displayed good motivation during session.    Personal Factors and Comorbidities Age;Comorbidity 3+;Time since onset of injury/illness/exacerbation;Comorbidity 2;Other    Comorbidities MCI, HTN, CA, cataracts    Examination-Activity Limitations Bend;Sit;Squat;Stairs;Stand;Sleep    Examination-Participation Restrictions Cleaning;Community Activity  PT Treatment/Interventions ADLs/Self Care Home Management;Therapeutic exercise;Patient/family education;Neuromuscular re-education;Therapeutic activities;Functional mobility training;Balance training;Manual techniques;Gait training;Stair training;Moist Heat;Electrical Stimulation;Cryotherapy;Taping;Dry needling;Energy conservation;Passive range of motion;Joint Manipulations    PT Next Visit Plan Discuss plan for DC, update HEP as needed; visit approval ends after 5/31    PT Home Exercise  Plan no updates this visit             Patient will benefit from skilled therapeutic intervention in order to improve the following deficits and impairments:  Decreased range of motion, Postural dysfunction, Pain, Abnormal gait, Decreased activity tolerance, Decreased coordination, Decreased endurance, Decreased balance, Decreased mobility, Difficulty walking, Hypomobility, Increased muscle spasms, Impaired perceived functional ability, Decreased strength, Impaired flexibility  Visit Diagnosis: Difficulty in walking, not elsewhere classified     Problem List Patient Active Problem List   Diagnosis Date Noted   Osteopenia of neck of right femur 09/06/2020   Obesity 08/29/2020   Parkinson disease ([place]) 02/22/2020   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   Bipolar disorder, in full remission, most recent episode depressed ([place]) 11/08/2019   Avitaminosis D 08/24/2019   Insomnia due to mental condition 05/31/2019   Bipolar I disorder, most recent episode depressed ([place]) 05/31/2019   Cognitive disorder 05/31/2019   Alcohol use disorder, moderate, in sustained remission ([place]) 05/31/2019   Elevated tumor markers 05/27/2019   GAD (generalized anxiety disorder) 04/10/2019   MDD (major depressive disorder), severe ([place]) 01/31/2019   High serum carbohydrate antigen 19-9 (CA19-9) 12/01/2018   Adjustment disorder with anxious mood 11/24/2018   Essential hypertension 11/24/2018   B12 deficiency 10/17/2018   Incisional hernia, without obstruction or gangrene 09/12/2018   Genetic testing 04/07/2018   Malignant neoplasm of upper-outer quadrant of right breast in female, estrogen receptor positive ([place]) 03/17/2018   Malignant neoplasm of upper-inner quadrant of right breast in female, estrogen receptor positive ([place]) 03/17/2018   History of psychosis 02/07/2018   History of insomnia 02/07/2018     Durwin Reges DPT Sharion Settler, SPT  Durwin Reges 04/28/2021, 4:40 PM  [facility] [address], [place], [address] Phone: [phone]   Fax:  [phone]  Name: Cinnamon Morency MRN: [id number] Date of Birth: Sep 10, 1946

## 2021-04-30 ENCOUNTER — Encounter: Payer: Medicare PPO | Admitting: Physical Therapy

## 2021-05-12 ENCOUNTER — Encounter: Payer: Self-pay | Admitting: Physical Therapy

## 2021-05-12 ENCOUNTER — Ambulatory Visit: Payer: Medicare PPO | Attending: Family Medicine | Admitting: Physical Therapy

## 2021-05-12 DIAGNOSIS — R262 Difficulty in walking, not elsewhere classified: Secondary | ICD-10-CM | POA: Diagnosis not present

## 2021-05-12 DIAGNOSIS — M6281 Muscle weakness (generalized): Secondary | ICD-10-CM | POA: Diagnosis not present

## 2021-05-12 DIAGNOSIS — M25551 Pain in right hip: Secondary | ICD-10-CM | POA: Diagnosis not present

## 2021-05-12 NOTE — Therapy (Signed)
[facility] [address] [place], [address] Phone: [phone]   Fax:  [phone]  Physical Therapy Treatment  Patient Details  Name: Kendra [name] MRN: [id number] Date of Birth: May 01, 1946 No data recorded  Encounter Date: 05/12/2021   PT End of Session - 05/12/21 0955     Visit Number 108    Number of Visits 150    Date for PT Re-Evaluation 06/25/21    Authorization Time Period 24 visits from 04/02/21- 06/27/21    Authorization - Visit Number 6    Authorization - Number of Visits 24    PT Start Time 0945    PT Stop Time 1025    PT Time Calculation (min) 40 min    Equipment Utilized During Treatment Gait belt    Activity Tolerance Patient tolerated treatment well;No increased pain    Behavior During Therapy [facility] for tasks assessed/performed             Past Medical History:  Diagnosis Date   Anxiety    Breast cancer ([place]) 03/2018   right breast cancer at 11:00 and 1:00   Bursitis    bilateral hips and knees   Complication of anesthesia    Depression    Family history of breast cancer 03/24/2018   Family history of lung cancer 03/24/2018   History of alcohol dependence ([place]) 2007   no ETOH; resolved since 2007   History of hiatal hernia    History of psychosis 2014   due to sleep disturbance   Hypertension    Parkinson disease ([place])    Personal history of radiation therapy 06/2018-07/2018   right breast ca   PONV (postoperative nausea and vomiting)     Past Surgical History:  Procedure Laterality Date   BREAST BIOPSY Right 03/14/2018   11:00 DCIS and invasive ductal carcinoma   BREAST BIOPSY Right 03/14/2018   1:00 Invasive ductal carcinoma   BREAST LUMPECTOMY Right 04/27/2018   lumpectomy of 11 and 1:00 cancers, clear margins, negative LN   BREAST LUMPECTOMY WITH RADIOACTIVE SEED AND SENTINEL LYMPH NODE BIOPSY Right 04/27/2018   Procedure: RIGHT BREAST LUMPECTOMY WITH RADIOACTIVE SEED  X 2 AND RIGHT SENTINEL LYMPH NODE BIOPSY;  Surgeon: [name], MD;  Location: [facility];  Service: General;  Laterality: Right;   CATARACT EXTRACTION W/PHACO Left 08/30/2019   Procedure: CATARACT EXTRACTION PHACO AND INTRAOCULAR LENS PLACEMENT ([place]) LEFT panoptix toric  01:20.5  12.7%  10.26;  Surgeon: Leandrew Koyanagi, MD;  Location: [facility];  Service: Ophthalmology;  Laterality: Left;   CATARACT EXTRACTION W/PHACO Right 09/20/2019   Procedure: CATARACT EXTRACTION PHACO AND INTRAOCULAR LENS PLACEMENT ([facility]) RIGHT 5.71  00:36.4  15.7%;  Surgeon: Leandrew Koyanagi, MD;  Location: [facility];  Service: Ophthalmology;  Laterality: Right;   LAPAROTOMY N/A 02/10/2018   Procedure: EXPLORATORY LAPAROTOMY;  Surgeon: Isabel Caprice, MD;  Location: [facility];  Service: Gynecology;  Laterality: N/A;   MASS EXCISION  01/2018   abdominal   OVARIAN CYST REMOVAL     SALPINGOOPHORECTOMY Bilateral 02/10/2018   Procedure: BILATERAL SALPINGO OOPHORECTOMY; PERITONEAL WASHINGS;  Surgeon: Isabel Caprice, MD;  Location: [facility];  Service: Gynecology;  Laterality: Bilateral;   TONSILLECTOMY     TONSILLECTOMY AND ADENOIDECTOMY  1953    There were no vitals filed for this visit.   Subjective Assessment - 05/12/21 0950     Subjective Reports she has been having bilat lateral hip pain over  the past week. Reports she has not been completing HEP d/t 7/10 pain. PT educated paitent on hip weakness causing trendelenberg gait, and that completing her HEP consistently is the key to increasing strength to reduce this and pain.    Pertinent History Patient report unclear regarding onset. Visited MD on Sept 16 and received injection with relief for approximately 1 month. Reports she cannot receive an additional injection pending eye surgery. Received dx from MD of hip bursitis in B hips. Reports pain with walking, stanindg, sitting 5/10 current 7/10 worst 3/10 best, with temporary  relief when she is sitting "just right" and with naproxen. Agg: activity. Cormorbidities: hx of breast cancer, HTN, MCI, insomnia, cataracts    Limitations Sitting;Standing;Walking;House hold activities    How long can you sit comfortably? Pain constant, reduced with neutral pelvic tilt    How long can you stand comfortably? Pain constant, increases with time    How long can you walk comfortably? Pain constant, increases with time    Patient Stated Goals Be able to move without hurting, walk 1 hour    Pain Onset More than a month ago               INTERVENTION THIS DATE: -Nustep, seat 8, arms 13, Level 3 x6 mintes, SPM>60; 50sec "normal" pace 10sec "fastest" speed x6 (following decreased hip pain to 5/10) - split squat with unilateral support for balance x8 with max cuing and demo needed initially for technique with good carry over; with back foot elevated 3in 2x 8 bilat with more difficulty R>L - Sidestepping RTB x10 each direction with difficulty with success; YTB x10 each direction with better carry over of technique (added to HEP) - Amb 255ft with 3# AW and cuing mostly for LLE clearance - Seated jack x12 with 1# DB in bilat hands (BUE at 90d flex) - alt lateral step with cross body reach to cone target; cone target for large step and for reach 2x 12                           PT Education - 05/12/21 0954     Education provided Yes    Education Details education of strengthening parameters, therex form/technique    Person(s) Educated Patient    Methods Explanation;Demonstration;Verbal cues    Comprehension Verbalized understanding;Returned demonstration;Verbal cues required              PT Short Term Goals - 01/14/21 1029       PT SHORT TERM GOAL #1   Title Patient will demonstrate complaince with HEP to speed recovery and reduce total number of visits.    Baseline HEP given; 01/14/2021: Independent with HEP    Time 2    Period Weeks    Status  Achieved    Target Date 09/27/19               PT Long Term Goals - 04/02/21 1129       PT LONG TERM GOAL #1   Title Patient will improve hip strength to 4/5 grossly to facilitate improved mobility with ADLs.    Baseline 3, 4-; 12/22 deferred to NV; 11/09/2019: 4-/5; 12/07/2019 L hip flexor 4, abd and ext 3+; 01/29/2020: 4/5 for most hip motions, 3+/5 for abd, ext; 03/20/2020: 4/5 for hip movements    Time 8    Period Weeks    Status Achieved      PT LONG TERM GOAL #2  Title Patient will demonstrate walking tolerance increased to 30 minutes without increase in symptoms to demonstrate improved ability to perform walking program for cardiovascular health.    Baseline 04/02/21 10 minute tolerance (patient report); 12/22 10 minutes (per pt report); 11/09/2019: 7 min; 12/07/2019 unable to assess due to icy streets;  01/29/2020: 10 min; 03/20/2020 10 min; 05/14/2020: 10 min x 3 per day; 06/11/2020: 19min x 3 per day; 07/15/2020: 82min x 3 per day; 09/02/2020: 74min x 3 (just standing rest breaks in between); 11/06/2019: 10 min x 3    Time 8    Period Weeks    Status On-going      PT LONG TERM GOAL #3   Title Patient will report worst pain of 3/10 during therapy to demonstrate reduced disability    Baseline 04/02/21 Worst 7/10; 12/22 worst 9/10; 11/09/2019: 9/10; 12/07/2019 7/10; 01/29/2020: 7/10; 05/14/2020: 0/10; 03/12/21: 5/10    Time 8    Period Weeks    Status On-going      PT LONG TERM GOAL #4   Title Patient will demonstrate ability to perform all bed mobility maneuvers without increase in pain for reduced disability with mobility and transfers with ADLs.    Baseline Reports pain with bed mobility supine <> sidelying L/R, sidelying to sitting EOB; 12/22 pt able to perform I without increased pain    Time 8    Period Weeks    Status Achieved      PT LONG TERM GOAL #5   Title Patient will improve 10MW speed to 1.0 m/sec to meet norms for minimal fall risk and full community ambulation.    Baseline  04/02/21 0.88 m/sec; 12/22 0.96 m/sec; 11/09/2019: deferred; 12/07/2019 0.90 m/sec; 01/29/2020: .9  m/sec; 03/20/2020: .73m/s; 05/14/2020: . 9 m/s; 06/11/2020: .24m/s; 07/15/2020: 1 m/s; 03/12/21:0.61m/s    Time 8    Period Weeks    Status On-going      PT LONG TERM GOAL #6   Title Patient will increase 6MWT by 200 ft to demonstrate improvement in functional capacity for transition to cardiac walking program    Baseline 03/12/21 6MWT = 1195; 1/7/20201: deferred; 12/07/2019 1095; 01/29/2020: deferred secondary to fatigue; 03/20/2020: 1021ft; 07/15/2020: 1127ft; 09/03/2020: 866ft; 11/05/2020: 971ft; 01/13/2021: 1019ft    Time 8    Period Weeks    Status On-going      PT LONG TERM GOAL #7   Title Patient will improve her 5xsts to under 15 sec to indicate singificnat improvement in LE functional strength and decrease in fall risk.    Baseline 24.5 sec; 03/20/2020: 19.19 sec; 06/13/2020: 14.92; 03/12/21: 12.91sec    Time 6    Period Weeks    Status Achieved      PT LONG TERM GOAL #8   Title Patient will improve FGA to 28/30 to indicate improvement in dynamic balance and improvement in stability.    Baseline 23/30; 06/11/2020: 25/30; 07/15/2020: 26/30; 09/02/2020: 27/30; 11/05/2020: 28/30    Time 6    Period Weeks    Status Achieved      PT LONG TERM GOAL  #9   TITLE Patient will be able to ascend/desend the stairs without use of UEs to increase safety and decrease fall risk with stair use.    Baseline requires use of UEs; 01/13/2021: able to perform with CGA; 03/12/21: able to perform 4 steps/up and down hands free supervision level    Time 6    Period Weeks    Status Deferred  PT LONG TERM GOAL  #10   TITLE Patient will improve her TUG scores to under 12 sec for normal tug; 15 sec for cognitive TUG to decrease fall risk overall    Baseline 01/13/21: 17.2 Regular ; 27.3 sec cognitive; 5/11 12.22sec regular (cognitive not assessed)    Time 6    Period Weeks    Status Deferred                   Plan -  05/12/21 1020     Clinical Impression Statement PT continued therex progression with more focus on lateral hip strengthening this session with success. PT educated patietn on lateral hip instability/weakness an it's contribution to hip pain, with education on parameters of strengthening, and need for consistency.Pt accepting to all education provided with HEP update as well. Pt reports she has been walking more but is still having trouble not shuffling. Needs fairly consistent VC for L foot clearance throughout gait. Patient is motivated throughout session with good carry over of provided cuing. PT will continue progression as able.    Personal Factors and Comorbidities Age;Comorbidity 3+;Time since onset of injury/illness/exacerbation;Comorbidity 2;Other    Comorbidities MCI, HTN, CA, cataracts    Examination-Activity Limitations Bend;Sit;Squat;Stairs;Stand;Sleep    Examination-Participation Restrictions Cleaning;Community Activity    Stability/Clinical Decision Making Stable/Uncomplicated    Clinical Decision Making Moderate    Rehab Potential Fair    Clinical Impairments Affecting Rehab Potential MCI    PT Frequency 2x / week    PT Duration 6 weeks    PT Treatment/Interventions ADLs/Self Care Home Management;Therapeutic exercise;Patient/family education;Neuromuscular re-education;Therapeutic activities;Functional mobility training;Balance training;Manual techniques;Gait training;Stair training;Moist Heat;Electrical Stimulation;Cryotherapy;Taping;Dry needling;Energy conservation;Passive range of motion;Joint Manipulations    PT Next Visit Plan Discuss plan for DC, update HEP as needed; visit approval ends after 5/31    PT Home Exercise Plan no updates this visit    Consulted and Agree with Plan of Care Patient             Patient will benefit from skilled therapeutic intervention in order to improve the following deficits and impairments:  Decreased range of motion, Postural dysfunction,  Pain, Abnormal gait, Decreased activity tolerance, Decreased coordination, Decreased endurance, Decreased balance, Decreased mobility, Difficulty walking, Hypomobility, Increased muscle spasms, Impaired perceived functional ability, Decreased strength, Impaired flexibility  Visit Diagnosis: Difficulty in walking, not elsewhere classified  Muscle weakness (generalized)  Pain in right hip     Problem List Patient Active Problem List   Diagnosis Date Noted   Osteopenia of neck of right femur 09/06/2020   Obesity 08/29/2020   Parkinson disease ([place]) 02/22/2020   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   Bipolar disorder, in full remission, most recent episode depressed ([place]) 11/08/2019   Avitaminosis D 08/24/2019   Insomnia due to mental condition 05/31/2019   Bipolar I disorder, most recent episode depressed ([place]) 05/31/2019   Cognitive disorder 05/31/2019   Alcohol use disorder, moderate, in sustained remission ([place]) 05/31/2019   Elevated tumor markers 05/27/2019   GAD (generalized anxiety disorder) 04/10/2019   MDD (major depressive disorder), severe ([place]) 01/31/2019   High serum carbohydrate antigen 19-9 (CA19-9) 12/01/2018   Adjustment disorder with anxious mood 11/24/2018   Essential hypertension 11/24/2018   B12 deficiency 10/17/2018   Incisional hernia, without obstruction or gangrene 09/12/2018   Genetic testing 04/07/2018   Malignant neoplasm of upper-outer quadrant of right breast in female, estrogen receptor positive ([place]) 03/17/2018   Malignant neoplasm of upper-inner quadrant of right breast in female, estrogen receptor  positive ([place]) 03/17/2018   History of psychosis 02/07/2018   History of insomnia 02/07/2018   Durwin Reges DPT Durwin Reges 05/12/2021, 10:34 AM  [facility] [address] [place], [address] Phone: [phone]   Fax:  [phone]  Name: Kendra [name] MRN:  [id number] Date of Birth: 1946-04-18

## 2021-05-13 DIAGNOSIS — M5416 Radiculopathy, lumbar region: Secondary | ICD-10-CM | POA: Diagnosis not present

## 2021-05-13 DIAGNOSIS — G20 Parkinson's disease: Secondary | ICD-10-CM | POA: Diagnosis not present

## 2021-05-13 DIAGNOSIS — M48061 Spinal stenosis, lumbar region without neurogenic claudication: Secondary | ICD-10-CM | POA: Diagnosis not present

## 2021-05-15 ENCOUNTER — Ambulatory Visit: Payer: Medicare PPO | Admitting: Family Medicine

## 2021-05-15 ENCOUNTER — Other Ambulatory Visit: Payer: Self-pay

## 2021-05-15 ENCOUNTER — Encounter: Payer: Self-pay | Admitting: Family Medicine

## 2021-05-15 ENCOUNTER — Telehealth: Payer: Self-pay

## 2021-05-15 VITALS — BP 136/80 | HR 80 | Temp 98.3°F | Ht 66.0 in | Wt 208.0 lb

## 2021-05-15 DIAGNOSIS — M48061 Spinal stenosis, lumbar region without neurogenic claudication: Secondary | ICD-10-CM | POA: Diagnosis not present

## 2021-05-15 DIAGNOSIS — M25551 Pain in right hip: Secondary | ICD-10-CM

## 2021-05-15 DIAGNOSIS — M545 Low back pain, unspecified: Secondary | ICD-10-CM

## 2021-05-15 DIAGNOSIS — G8929 Other chronic pain: Secondary | ICD-10-CM

## 2021-05-15 DIAGNOSIS — M25552 Pain in left hip: Secondary | ICD-10-CM

## 2021-05-15 MED ORDER — TRAMADOL HCL 50 MG PO TABS: 50.0000 mg | ORAL_TABLET | Freq: Three times a day (TID) | ORAL | 0 refills | Status: AC | PRN

## 2021-05-15 NOTE — Telephone Encounter (Signed)
Copied from [place] [id number]. Topic: General - Other >> May 15, 2021  2:11 PM Alanda Slim E wrote: Reason for CRM: Pts husband called to speak with Dr. Jacinto Reap or nurse about the many rehab visit scheduled for the pt/ they are a bit confuse about what they are for/ please advise

## 2021-05-15 NOTE — Progress Notes (Signed)
Established patient visit   Patient: Kendra [name]   DOB: 04-12-1946   75 y.o. Female  MRN: [id number] Visit Date: 05/15/2021  Today's healthcare provider: Lavon Paganini, MD   Subjective    Hip Pain  There was no injury mechanism. The quality of the pain is described as aching. The pain is at a severity of 10/10. The pain is severe. The pain has been Constant since onset. Associated symptoms include an inability to bear weight and muscle weakness. Pertinent negatives include no numbness. She reports no foreign bodies present. The symptoms are aggravated by movement. She has tried acetaminophen for the symptoms. The treatment provided no relief.  Back Pain This is a chronic problem. The problem has been gradually worsening since onset. The pain is present in the lumbar spine. The quality of the pain is described as aching. Radiates to: bilateral legs. The pain is at a severity of 10/10. The pain is severe. The pain is The same all the time. The symptoms are aggravated by standing, sitting, position, twisting and bending. Pertinent negatives include no abdominal pain, chest pain, fever, headaches, numbness, pelvic pain or weakness.   Neurology visit on 05/14/21 and had MRI imaging. Her husband reports the pain has increased which is causing immobility and difficulty with physical therapy. She was prescribed 100 mg Gabapentin and she has only used it today because it was picked up from the pharmacy yesterday. The bilateral hip and back pain is associated with spinal stenosis.   HPI     Hip Pain    Additional comments: Lower abck      Last edited by Barnie Mort on 05/15/2021 11:21 AM.      .Genhip Patient Active Problem List   Diagnosis Date Noted   Osteopenia of neck of right femur 09/06/2020   Obesity 08/29/2020   Parkinson disease ([place]) 02/22/2020   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   Bipolar disorder, in full remission, most recent episode depressed ([place]) 11/08/2019    Avitaminosis D 08/24/2019   Insomnia due to mental condition 05/31/2019   Bipolar I disorder, most recent episode depressed ([place]) 05/31/2019   Cognitive disorder 05/31/2019   Alcohol use disorder, moderate, in sustained remission ([place]) 05/31/2019   Elevated tumor markers 05/27/2019   GAD (generalized anxiety disorder) 04/10/2019   MDD (major depressive disorder), severe ([place]) 01/31/2019   High serum carbohydrate antigen 19-9 (CA19-9) 12/01/2018   Adjustment disorder with anxious mood 11/24/2018   Essential hypertension 11/24/2018   B12 deficiency 10/17/2018   Incisional hernia, without obstruction or gangrene 09/12/2018   Genetic testing 04/07/2018   Malignant neoplasm of upper-outer quadrant of right breast in female, estrogen receptor positive ([place]) 03/17/2018   Malignant neoplasm of upper-inner quadrant of right breast in female, estrogen receptor positive ([place]) 03/17/2018   History of psychosis 02/07/2018   History of insomnia 02/07/2018   Past Medical History:  Diagnosis Date   Anxiety    Breast cancer ([place]) 03/2018   right breast cancer at 11:00 and 1:00   Bursitis    bilateral hips and knees   Complication of anesthesia    Depression    Family history of breast cancer 03/24/2018   Family history of lung cancer 03/24/2018   History of alcohol dependence ([place]) 2007   no ETOH; resolved since 2007   History of hiatal hernia    History of psychosis 2014   due to sleep disturbance   Hypertension    Parkinson disease ([place])  Personal history of radiation therapy 06/2018-07/2018   right breast ca   PONV (postoperative nausea and vomiting)    Allergies  Allergen Reactions   Hydroxyzine Anaphylaxis    Tongue swollen   Other Other (See Comments)    Food poisoning    Shrimp [Shellfish Allergy] Other (See Comments)    Food poisoning        Medications: Outpatient Medications Prior to Visit  Medication Sig   Acetaminophen 500 MG capsule Take 1,000 mg by mouth every 6  (six) hours as needed.    carbidopa-levodopa (SINEMET CR) 50-200 MG tablet Take by mouth 4 (four) times daily.   carbidopa-levodopa (SINEMET IR) 25-100 MG tablet Take 1 tablet by mouth at bedtime.    cyanocobalamin (,VITAMIN B-12,) 1000 MCG/ML injection INJECT 1 ML INTO THE MUSCLE EVERY 30 DAYS   denosumab (PROLIA) 60 MG/ML SOSY injection Inject 60 mg into the skin every 6 (six) months. takes annually   FLUoxetine HCl 60 MG TABS Take 60 mg by mouth daily.   gabapentin (NEURONTIN) 100 MG capsule Take 100 mg by mouth. 2 capsules 3 times a day   letrozole (FEMARA) 2.5 MG tablet Take 1 tablet (2.5 mg total) by mouth daily.   lisinopril-hydrochlorothiazide (ZESTORETIC) 20-12.5 MG tablet Take 1 tablet by mouth daily.   naproxen sodium (ALEVE) 220 MG tablet Take 220 mg by mouth 2 (two) times daily as needed.    NYAMYC powder APPLY TOPICALLY THREE TIMES A DAY   polyethylene glycol (MIRALAX / GLYCOLAX) 17 g packet Take 17 g by mouth daily.   risperiDONE (RISPERDAL) 2 MG tablet Take 0.5 tablets (1 mg total) by mouth at bedtime.   traZODone (DESYREL) 100 MG tablet Take 1 tablet (100 mg total) by mouth at bedtime.   [DISCONTINUED] risperiDONE (RISPERDAL) 0.25 MG tablet Take 3 tablets (0.75 mg total) by mouth daily.   Facility-Administered Medications Prior to Visit  Medication Dose Route Frequency Provider   lidocaine (PF) (XYLOCAINE) 1 % injection 4 mL  4 mL Intradermal Once Maikel Neisler, Dionne Bucy, MD   lidocaine (PF) (XYLOCAINE) 1 % injection 4 mL  4 mL Intradermal Once Virginia Crews, MD   Review of Systems  Constitutional:  Negative for chills, fatigue and fever.  HENT:  Negative for ear pain, sinus pressure, sinus pain and sore throat.   Eyes:  Negative for pain and visual disturbance.  Respiratory:  Negative for cough, chest tightness, shortness of breath and wheezing.   Cardiovascular:  Negative for chest pain, palpitations and leg swelling.  Gastrointestinal:  Negative for abdominal pain,  blood in stool, diarrhea, nausea and vomiting.  Genitourinary:  Negative for flank pain, frequency, pelvic pain and urgency.  Musculoskeletal:  Positive for back pain. Negative for myalgias and neck pain.       (+) hip pain  Neurological:  Negative for dizziness, weakness, light-headedness, numbness and headaches.     Objective    BP 136/80   Pulse 80   Temp 98.3 F (36.8 C) (Oral)   Ht 5\' 6"  (1.676 m)   Wt 208 lb (94.3 kg)   SpO2 99%   BMI 33.57 kg/m  BP Readings from Last 3 Encounters:  05/15/21 136/80  02/27/21 132/75  02/24/21 119/67   Wt Readings from Last 3 Encounters:  05/15/21 208 lb (94.3 kg)  02/27/21 209 lb (94.8 kg)  02/24/21 208 lb 15.9 oz (94.8 kg)    Physical Exam Vitals reviewed.  Constitutional:      General: She is not  in acute distress.    Appearance: Normal appearance. She is well-developed. She is not diaphoretic.  HENT:     Head: Normocephalic and atraumatic.  Eyes:     General: No scleral icterus.    Conjunctiva/sclera: Conjunctivae normal.  Neck:     Thyroid: No thyromegaly.  Cardiovascular:     Rate and Rhythm: Normal rate and regular rhythm.     Pulses: Normal pulses.     Heart sounds: Normal heart sounds. No murmur heard. Pulmonary:     Effort: Pulmonary effort is normal. No respiratory distress.     Breath sounds: Normal breath sounds. No wheezing, rhonchi or rales.  Musculoskeletal:     Cervical back: Neck supple.     Right lower leg: No edema.     Left lower leg: No edema.  Lymphadenopathy:     Cervical: No cervical adenopathy.  Skin:    General: Skin is warm and dry.     Findings: No rash.  Neurological:     Mental Status: She is alert and oriented to person, place, and time. Mental status is at baseline.  Psychiatric:        Mood and Affect: Mood normal.        Behavior: Behavior normal.    No results found for any visits on 05/15/21.  Assessment & Plan     Problem List Items Addressed This Visit   None Visit  Diagnoses     Chronic bilateral low back pain, unspecified whether sciatica present    -  Primary   Relevant Medications   traMADol (ULTRAM) 50 MG tablet   Other Relevant Orders   Ambulatory referral to Pain Clinic   Hip pain, bilateral       Relevant Orders   Ambulatory referral to Pain Clinic   Spinal stenosis of lumbar region, unspecified whether neurogenic claudication present       Relevant Orders   Ambulatory referral to Pain Clinic       - discussed role that each referral will have in her care going forward - discussed that she is unlikely to be surgical candidate - discussed goal of perserving function and quality of life - will give small supply of Tramadol to use sparingly, but discussed that this is not a chronic medication and gabapentin will be the mainstay of her treatment - discussed seeing if pain management may be able to do any interventions to help pain - advised on accupuncture and massage - return precautions discussed   Return if symptoms worsen or fail to improve.      I,Essence Turner,acting as a Education administrator for Lavon Paganini, MD.,have documented all relevant documentation on the behalf of Lavon Paganini, MD,as directed by  Lavon Paganini, MD while in the presence of Lavon Paganini, MD.  I, Lavon Paganini, MD, have reviewed all documentation for this visit. The documentation on 05/15/21 for the exam, diagnosis, procedures, and orders are all accurate and complete.   Indie Boehne, Dionne Bucy, MD, MPH [facility]

## 2021-05-16 NOTE — Telephone Encounter (Signed)
Patient reports she does not have any questions regarding rehab. Patient however did have question regarding medication  carbidopa-levodopa (SINEMET CR) 50-200 MG tablet   05/13/2021 08/11/2021  Sig:   Take by mouth 4 (four) times daily.    Route:   Oral    Class:   Historical Med    carbidopa-levodopa (SINEMET IR) 25-100 MG tablet      Patient reports she is not sure which does or how many times to take medication. I advised patient to contact Dr. Trena Platt office for clarification on medication.

## 2021-05-19 ENCOUNTER — Encounter: Payer: Medicare PPO | Admitting: Physical Therapy

## 2021-05-19 NOTE — Telephone Encounter (Signed)
Noted  

## 2021-05-23 ENCOUNTER — Ambulatory Visit (INDEPENDENT_AMBULATORY_CARE_PROVIDER_SITE_OTHER): Payer: Medicare PPO | Admitting: Psychiatry

## 2021-05-23 ENCOUNTER — Encounter: Payer: Self-pay | Admitting: Psychiatry

## 2021-05-23 ENCOUNTER — Other Ambulatory Visit: Payer: Self-pay

## 2021-05-23 VITALS — BP 179/78 | HR 92 | Temp 97.3°F | Wt 213.0 lb

## 2021-05-23 DIAGNOSIS — F5105 Insomnia due to other mental disorder: Secondary | ICD-10-CM

## 2021-05-23 DIAGNOSIS — F3176 Bipolar disorder, in full remission, most recent episode depressed: Secondary | ICD-10-CM | POA: Diagnosis not present

## 2021-05-23 DIAGNOSIS — R030 Elevated blood-pressure reading, without diagnosis of hypertension: Secondary | ICD-10-CM | POA: Diagnosis not present

## 2021-05-23 DIAGNOSIS — F411 Generalized anxiety disorder: Secondary | ICD-10-CM

## 2021-05-23 MED ORDER — RISPERIDONE 0.25 MG PO TABS
0.5000 mg | ORAL_TABLET | Freq: Every day | ORAL | 0 refills | Status: DC
Start: 2021-05-23 — End: 2021-05-26

## 2021-05-23 NOTE — Progress Notes (Signed)
[facility] MD OP Progress Note  05/23/2021 12:27 PM Kendra [name]  MRN:  [id number]  Chief Complaint:  Chief Complaint   Follow-up    HPI: Kendra [name] is a 75 year old Caucasian female, married, lives in [place], has a history of bipolar disorder, GAD, insomnia, multifocal stage I right breast cancer status post treatment-lumpectomy, ovarian cyst removal, Parkinson's disease, bilateral hearing loss was evaluated in office today.  Collateral information was obtained from husband-[name] who participated in the session today.  Patient was able to participate well in the session.  She was pleasant and cooperative.  She answered all questions appropriately.  Patient appeared to be alert, oriented to person place time and situation.  Patient reports she is currently in severe pain of her back, since the past 1 month or so radiating down to both her legs.  This does put a lot of physical limitation on her and hence she is unable to walk or stand for too long.  She is currently following up with her primary care provider as well as neurologist.  She also has a history of Parkinson's disease.  Patient recently saw Dr. Rubin Payor diagnosis was chronic bilateral low back pain likely due to spinal stenosis of lumbar region -was started on tramadol 50 mg every 8 hours as needed as well as was referred to a pain clinic.  Patient also was seen by neurologist-Dr. Bernerd Limbo 05/13/2021-advised to increase Sinemet as well as gabapentin.  Patient reports the gabapentin likely could be contributing to drowsiness during the day.  Patient otherwise denies any significant mood symptoms.  Patient reports sleep is overall okay.  She has been compliant on medications.  Denies any new side effects.  According to [facility] patient does have significant pain which does limit her ability to function as well as make her anxious.  He has been trying to support her as she goes through her treatment for the same.  Visit  Diagnosis:    ICD-10-CM   1. Bipolar disorder, in full remission, most recent episode depressed ([place])  F31.76 risperiDONE (RISPERDAL) 0.25 MG tablet    2. GAD (generalized anxiety disorder)  F41.1     3. Insomnia due to mental condition  F51.05    mood    4. Elevated blood pressure reading  R03.0       Past Psychiatric History: Reviewed past psychiatric history from progress note on 03/01/2019.  Past trials of risperidone, Prozac, Seroquel, Ativan  Past Medical History:  Past Medical History:  Diagnosis Date   Anxiety    Breast cancer ([place]) 03/2018   right breast cancer at 11:00 and 1:00   Bursitis    bilateral hips and knees   Complication of anesthesia    Depression    Family history of breast cancer 03/24/2018   Family history of lung cancer 03/24/2018   History of alcohol dependence ([place]) 2007   no ETOH; resolved since 2007   History of hiatal hernia    History of psychosis 2014   due to sleep disturbance   Hypertension    Parkinson disease ([place])    Personal history of radiation therapy 06/2018-07/2018   right breast ca   PONV (postoperative nausea and vomiting)     Past Surgical History:  Procedure Laterality Date   BREAST BIOPSY Right 03/14/2018   11:00 DCIS and invasive ductal carcinoma   BREAST BIOPSY Right 03/14/2018   1:00 Invasive ductal carcinoma   BREAST LUMPECTOMY Right 04/27/2018   lumpectomy of 11 and 1:00 cancers, clear margins,  negative LN   BREAST LUMPECTOMY WITH RADIOACTIVE SEED AND SENTINEL LYMPH NODE BIOPSY Right 04/27/2018   Procedure: RIGHT BREAST LUMPECTOMY WITH RADIOACTIVE SEED X 2 AND RIGHT SENTINEL LYMPH NODE BIOPSY;  Surgeon: [name], MD;  Location: [facility];  Service: General;  Laterality: Right;   CATARACT EXTRACTION W/PHACO Left 08/30/2019   Procedure: CATARACT EXTRACTION PHACO AND INTRAOCULAR LENS PLACEMENT ([place]) LEFT panoptix toric  01:20.5  12.7%  10.26;  Surgeon: Leandrew Koyanagi, MD;  Location: [facility];  Service: Ophthalmology;  Laterality: Left;   CATARACT EXTRACTION W/PHACO Right 09/20/2019   Procedure: CATARACT EXTRACTION PHACO AND INTRAOCULAR LENS PLACEMENT ([facility]) RIGHT 5.71  00:36.4  15.7%;  Surgeon: Leandrew Koyanagi, MD;  Location: [facility];  Service: Ophthalmology;  Laterality: Right;   LAPAROTOMY N/A 02/10/2018   Procedure: EXPLORATORY LAPAROTOMY;  Surgeon: Isabel Caprice, MD;  Location: [facility];  Service: Gynecology;  Laterality: N/A;   MASS EXCISION  01/2018   abdominal   OVARIAN CYST REMOVAL     SALPINGOOPHORECTOMY Bilateral 02/10/2018   Procedure: BILATERAL SALPINGO OOPHORECTOMY; PERITONEAL WASHINGS;  Surgeon: Isabel Caprice, MD;  Location: [facility];  Service: Gynecology;  Laterality: Bilateral;   TONSILLECTOMY     TONSILLECTOMY AND ADENOIDECTOMY  1953    Family Psychiatric History: Reviewed family psychiatric history from progress note on 03/01/2019  Family History:  Family History  Problem Relation Age of Onset   Diabetes Mother    Breast cancer Mother 48   Hypertension Mother    Lung cancer Father        asbestos exposure   Heart disease Brother 18   Breast cancer Cousin 74       paternal cousin   Heart disease Paternal Grandmother 32   Heart disease Paternal Grandfather     Social History: Reviewed social history from progress note on 03/01/2019 Social History   Socioeconomic History   Marital status: Married    Spouse name: robert   Number of children: 0   Years of education: Not on file   Highest education level: Some college, no degree  Occupational History   Occupation: retired Furniture conservator/restorer of education  Tobacco Use   Smoking status: Never   Smokeless tobacco: Never  Scientific laboratory technician Use: Never used  Substance and Sexual Activity   Alcohol use: Not Currently    Comment: alcohol dependence prior to 2007   Drug use: Never   Sexual activity: Yes    Partners: Male    Birth control/protection: Surgical   Other Topics Concern   Not on file  Social History Narrative   Not on file   Social Determinants of Health   Financial Resource Strain: Low Risk    Difficulty of Paying Living Expenses: Not hard at all  Food Insecurity: No Food Insecurity   Worried About Charity fundraiser in the Last Year: Never true   [place] in the Last Year: Never true  Transportation Needs: No Transportation Needs   Lack of Transportation (Medical): No   Lack of Transportation (Non-Medical): No  Physical Activity: Inactive   Days of Exercise per Week: 0 days   Minutes of Exercise per Session: 0 min  Stress: Stress Concern Present   Feeling of Stress : To some extent  Social Connections: Moderately Integrated   Frequency of Communication with Friends and Family: More than three times a week   Frequency of Social Gatherings with Friends and Family: More  than three times a week   Attends Religious Services: Never   Active Member of Clubs or Organizations: Yes   Attends Archivist Meetings: 1 to 4 times per year   Marital Status: Married    Allergies:  Allergies  Allergen Reactions   Hydroxyzine Anaphylaxis    Tongue swollen   Other Other (See Comments)    Food poisoning    Shrimp [Shellfish Allergy] Other (See Comments)    Food poisoning     Metabolic Disorder Labs: Lab Results  Component Value Date   HGBA1C 5.4 04/13/2019   Lab Results  Component Value Date   PROLACTIN 101.9 06/20/2015   Lab Results  Component Value Date   CHOL 185 02/27/2021   TRIG 141 02/27/2021   HDL 59 02/27/2021   CHOLHDL 3.1 02/27/2021   LDLCALC 101 (H) 02/27/2021   LDLCALC 95 04/13/2019   Lab Results  Component Value Date   TSH 2.31 04/13/2019   TSH 2.543 07/25/2018    Therapeutic Level Labs: No results found for: LITHIUM No results found for: VALPROATE No components found for:  CBMZ  Current Medications: Current Outpatient Medications  Medication Sig Dispense Refill    Acetaminophen 500 MG capsule Take 1,000 mg by mouth every 6 (six) hours as needed.      carbidopa-levodopa (SINEMET CR) 50-200 MG tablet Take by mouth 4 (four) times daily.     carbidopa-levodopa (SINEMET IR) 25-100 MG tablet Take 1 tablet by mouth at bedtime.      cyanocobalamin (,VITAMIN B-12,) 1000 MCG/ML injection INJECT 1 ML INTO THE MUSCLE EVERY 30 DAYS 3 mL 3   denosumab (PROLIA) 60 MG/ML SOSY injection Inject 60 mg into the skin every 6 (six) months. takes annually     FLUoxetine HCl 60 MG TABS Take 60 mg by mouth daily. 90 tablet 1   gabapentin (NEURONTIN) 100 MG capsule Take 100 mg by mouth. 2 capsules 3 times a day     letrozole (FEMARA) 2.5 MG tablet Take 1 tablet (2.5 mg total) by mouth daily. 90 tablet 3   lisinopril-hydrochlorothiazide (ZESTORETIC) 20-12.5 MG tablet Take 1 tablet by mouth daily. 90 tablet 3   naproxen sodium (ALEVE) 220 MG tablet Take 220 mg by mouth 2 (two) times daily as needed.      NYAMYC powder APPLY TOPICALLY THREE TIMES A DAY 15 g 1   polyethylene glycol (MIRALAX / GLYCOLAX) 17 g packet Take 17 g by mouth daily.     risperiDONE (RISPERDAL) 0.25 MG tablet Take 2 tablets (0.5 mg total) by mouth daily. Patient has supplies 60 tablet 0   risperiDONE (RISPERDAL) 2 MG tablet Take 0.5 tablets (1 mg total) by mouth at bedtime. 45 tablet 0   traZODone (DESYREL) 100 MG tablet Take 1 tablet (100 mg total) by mouth at bedtime. 90 tablet 0   Current Facility-Administered Medications  Medication Dose Route Frequency Provider Last Rate Last Admin   lidocaine (PF) (XYLOCAINE) 1 % injection 4 mL  4 mL Intradermal Once Bacigalupo, Dionne Bucy, MD       lidocaine (PF) (XYLOCAINE) 1 % injection 4 mL  4 mL Intradermal Once Bacigalupo, Dionne Bucy, MD         Musculoskeletal: Strength & Muscle Tone: cogwheel Gait & Station:  slow Patient leans: N/A  Psychiatric Specialty Exam: Review of Systems  Musculoskeletal:  Positive for back pain.       Radiating down legs   Neurological:  Positive for tremors.  All other systems reviewed  and are negative.  Blood pressure (!) 179/78, pulse 92, temperature (!) 97.3 F (36.3 C), temperature source Temporal, weight 213 lb (96.6 kg).Body mass index is 34.38 kg/m.  General Appearance: Casual  Eye Contact:  Fair  Speech:  Normal Rate  Volume:  Normal  Mood:  Anxious  Affect:  Congruent  Thought Process:  Goal Directed and Descriptions of Associations: Intact  Orientation:  Full (Time, Place, and Person)  Thought Content: Logical   Suicidal Thoughts:  No  Homicidal Thoughts:  No  Memory:  Immediate;   Fair Recent;   Fair Remote;   LIMITED  Judgement:  Fair  Insight:  Fair  Psychomotor Activity:  Tremor  Concentration:  Concentration: Fair and Attention Span: Fair  Recall:  [employer] of Knowledge: Fair  Language: Fair  Akathisia:  No  Handed:  Right  AIMS (if indicated): done  Assets:  Communication Skills Desire for Improvement Housing Intimacy Social Support  ADL's:  Intact  Cognition: Baseline  Sleep:  Fair   Screenings: [place] Office Visit from 01/21/2021 in [place] Total Score 0      GAD-7    Flowsheet Row Counselor from 03/25/2021 in [place] 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 [place]  Total GAD-7 Score 4 7      PHQ2-9    [place] Visit from 05/23/2021 in [place] from 05/15/2021 in [place] from 03/25/2021 in [place] from 02/27/2021 in [place] Visit from 01/21/2021 in [place]  PHQ-2 Total Score '1 1 3 2 '$ 0  PHQ-9 Total Score -- '9 14 8 '$ --      [place] Office Visit from 05/23/2021 in [place] Counselor from 03/25/2021 in [place] from 02/03/2021 in [place] No Risk No Risk No Risk        Assessment and Plan: Kendra [name] is a 75 year old Caucasian female, married, lives in [place], has a history of bipolar disorder, GAD, insomnia, multiple medical problems including primary Parkinson's disease, hearing loss was evaluated in office today.  Patient is currently in pain which likely also could be contributing to her elevated blood pressure reading today.  She will continue to need sufficient pain management.  Plan as noted below.  Plan Bipolar disorder in remission We will reduce risperidone to 0.5 mg p.o. daily during the day and continue 1 mg p.o. nightly Prozac 60 mg p.o. daily  GAD-stable Continue CBT with Ms. Christina Hussami Prozac 60 mg p.o. daily  Insomnia-stable Trazodone 50 to 100 mg p.o. nightly as needed Melatonin as needed  Elevated blood pressure reading-likely due to her pain.  Patient will benefit from continued follow-up with her primary care provider.  Collateral information obtained from [facility] as noted above.  Collateral information obtained from medical records-notes per Dr. Brita Romp dated 05/15/2021, Dr. Manuella Ghazi dated 05/13/2021 as noted above.  Discussed drug to drug interaction especially tramadol with her psychotropic medication like Prozac, trazodone and risperidone including serotonin syndrome.  This note was generated in part or whole with voice recognition software. Voice recognition is usually quite accurate but there are transcription errors that can and very often do occur. I apologize for any typographical errors that were not detected and corrected.       Ursula Alert, MD 05/24/2021, 11:05 AM

## 2021-05-23 NOTE — Patient Instructions (Signed)
Please start taking Risperidone 0.25 mg - 2 tablets in the AM instead of 3 tablets.  Continue Risperidone half tablet of 2 mg ( 1 mg ) at bedtime .  Continue all other medications - no other changes made today.

## 2021-05-26 ENCOUNTER — Encounter: Payer: Self-pay | Admitting: Physical Therapy

## 2021-05-26 ENCOUNTER — Telehealth: Payer: Self-pay

## 2021-05-26 ENCOUNTER — Ambulatory Visit: Payer: Medicare PPO | Admitting: Physical Therapy

## 2021-05-26 DIAGNOSIS — M6281 Muscle weakness (generalized): Secondary | ICD-10-CM | POA: Diagnosis not present

## 2021-05-26 DIAGNOSIS — R262 Difficulty in walking, not elsewhere classified: Secondary | ICD-10-CM | POA: Diagnosis not present

## 2021-05-26 DIAGNOSIS — F3176 Bipolar disorder, in full remission, most recent episode depressed: Secondary | ICD-10-CM

## 2021-05-26 DIAGNOSIS — M25551 Pain in right hip: Secondary | ICD-10-CM | POA: Diagnosis not present

## 2021-05-26 MED ORDER — RISPERIDONE 1 MG PO TABS: 0.5000 mg | ORAL_TABLET | ORAL | 1 refills | Status: DC

## 2021-05-26 NOTE — Telephone Encounter (Signed)
I have sent a new prescription with dose changed to pharmacy.

## 2021-05-26 NOTE — Telephone Encounter (Signed)
Copied from [place] [id number]. Topic: General - Inquiry >> May 26, 2021 12:23 PM Greggory Keen D wrote: Reason for CRM: Pt called saying she is still having back pain and wondering if she can get a refill on the tramadol.    CB#  [phone]

## 2021-05-26 NOTE — Telephone Encounter (Signed)
Please review.  It looks like tramadol was prescribed 05/15/2021 (See office visit)  I don't see this on her med list.   Thanks,   -Mickel Baas

## 2021-05-26 NOTE — Telephone Encounter (Signed)
pt husband called states that the '20mg'$  of risperidone pt has 8 tablets the 2.5 mg she had 53.  the rx did not go threw to the pharmacy

## 2021-05-26 NOTE — Therapy (Signed)
[facility] [address], [place], [address] Phone: [phone]   Fax:  [phone]  Physical Therapy Treatment  Patient Details  Name: Kendra [name] MRN: [id number] Date of Birth: 03-13-1946 No data recorded  Encounter Date: 05/26/2021   PT End of Session - 05/26/21 1348     Visit Number 109    Number of Visits 150    Date for PT Re-Evaluation 06/25/21    Authorization Time Period 24 visits from 04/02/21- 06/27/21    Authorization - Visit Number 7    Authorization - Number of Visits 24    PT Start Time [id number]    PT Stop Time [id number]    PT Time Calculation (min) 47 min    Equipment Utilized During Treatment Gait belt    Activity Tolerance Patient tolerated treatment well;No increased pain    Behavior During Therapy [facility] for tasks assessed/performed             Past Medical History:  Diagnosis Date   Anxiety    Breast cancer ([place]) 03/2018   right breast cancer at 11:00 and 1:00   Bursitis    bilateral hips and knees   Complication of anesthesia    Depression    Family history of breast cancer 03/24/2018   Family history of lung cancer 03/24/2018   History of alcohol dependence ([place]) 2007   no ETOH; resolved since 2007   History of hiatal hernia    History of psychosis 2014   due to sleep disturbance   Hypertension    Parkinson disease ([place])    Personal history of radiation therapy 06/2018-07/2018   right breast ca   PONV (postoperative nausea and vomiting)     Past Surgical History:  Procedure Laterality Date   BREAST BIOPSY Right 03/14/2018   11:00 DCIS and invasive ductal carcinoma   BREAST BIOPSY Right 03/14/2018   1:00 Invasive ductal carcinoma   BREAST LUMPECTOMY Right 04/27/2018   lumpectomy of 11 and 1:00 cancers, clear margins, negative LN   BREAST LUMPECTOMY WITH RADIOACTIVE SEED AND SENTINEL LYMPH NODE BIOPSY Right 04/27/2018   Procedure: RIGHT BREAST LUMPECTOMY WITH RADIOACTIVE SEED  X 2 AND RIGHT SENTINEL LYMPH NODE BIOPSY;  Surgeon: [name], MD;  Location: [place];  Service: General;  Laterality: Right;   CATARACT EXTRACTION W/PHACO Left 08/30/2019   Procedure: CATARACT EXTRACTION PHACO AND INTRAOCULAR LENS PLACEMENT ([place]) LEFT panoptix toric  01:20.5  12.7%  10.26;  Surgeon: Leandrew Koyanagi, MD;  Location: [place];  Service: Ophthalmology;  Laterality: Left;   CATARACT EXTRACTION W/PHACO Right 09/20/2019   Procedure: CATARACT EXTRACTION PHACO AND INTRAOCULAR LENS PLACEMENT ([facility]) RIGHT 5.71  00:36.4  15.7%;  Surgeon: Leandrew Koyanagi, MD;  Location: [place];  Service: Ophthalmology;  Laterality: Right;   LAPAROTOMY N/A 02/10/2018   Procedure: EXPLORATORY LAPAROTOMY;  Surgeon: Isabel Caprice, MD;  Location: [facility];  Service: Gynecology;  Laterality: N/A;   MASS EXCISION  01/2018   abdominal   OVARIAN CYST REMOVAL     SALPINGOOPHORECTOMY Bilateral 02/10/2018   Procedure: BILATERAL SALPINGO OOPHORECTOMY; PERITONEAL WASHINGS;  Surgeon: Isabel Caprice, MD;  Location: [facility];  Service: Gynecology;  Laterality: Bilateral;   TONSILLECTOMY     TONSILLECTOMY AND ADENOIDECTOMY  1953    There were no vitals filed for this visit.   Subjective Assessment - 05/26/21 1308     Subjective Pt reports feeling well today other than having pain in  her hip that aches into her thighs and low back. She reports her hip pain at 9/10 on NPRS. She reports active participation in HEP but reports she is slow at completing it.    Pertinent History Patient report unclear regarding onset. Visited MD on Sept 16 and received injection with relief for approximately 1 month. Reports she cannot receive an additional injection pending eye surgery. Received dx from MD of hip bursitis in B hips. Reports pain with walking, stanindg, sitting 5/10 current 7/10 worst 3/10 best, with temporary relief when she is sitting "just right" and with naproxen. Agg:  activity. Cormorbidities: hx of breast cancer, HTN, MCI, insomnia, cataracts    Limitations Sitting;Standing;Walking;House hold activities    How long can you sit comfortably? Pain constant, reduced with neutral pelvic tilt    How long can you stand comfortably? Pain constant, increases with time    How long can you walk comfortably? Pain constant, increases with time    Patient Stated Goals Be able to move without hurting, walk 1 hour    Pain Onset More than a month ago            Therapeutic Exercise  Nustep, seat 8, arms 10, Level 2 x 6 mintes, SPM>40  AMB 200 ft with emphasis on  large amplitude reciprocal arm swing and large step length  Side Stepping with Counter Support - 2 x 10 reps with 2# AW   Standing March with Counter Support 2 x 45 sec 2# AW   Seated knee ext 2# with alternating high fives 2 x 10 reps  Squat with [employer]  2 x 10 reps  Seated half jacks 2 x 12 reps with good carry over of mirroring   Bent over Hip extension 2 x 10 reps with cues to straighten leg and to raise/lower slowly                           PT Education - 05/26/21 1407     Education Details Pt educated again on PT POC until [employer] [place] program to begin.    Person(s) Educated Patient    Methods Explanation    Comprehension Verbalized understanding              PT Short Term Goals - 01/14/21 1029       PT SHORT TERM GOAL #1   Title Patient will demonstrate complaince with HEP to speed recovery and reduce total number of visits.    Baseline HEP given; 01/14/2021: Independent with HEP    Time 2    Period Weeks    Status Achieved    Target Date 09/27/19               PT Long Term Goals - 04/02/21 1129       PT LONG TERM GOAL #1   Title Patient will improve hip strength to 4/5 grossly to facilitate improved mobility with ADLs.    Baseline 3, 4-; 12/22 deferred to NV; 11/09/2019: 4-/5; 12/07/2019 L hip flexor 4, abd and ext 3+;  01/29/2020: 4/5 for most hip motions, 3+/5 for abd, ext; 03/20/2020: 4/5 for hip movements    Time 8    Period Weeks    Status Achieved      PT LONG TERM GOAL #2   Title Patient will demonstrate walking tolerance increased to 30 minutes without increase in symptoms to demonstrate improved ability to perform walking program for  cardiovascular health.    Baseline 04/02/21 10 minute tolerance (patient report); 12/22 10 minutes (per pt report); 11/09/2019: 7 min; 12/07/2019 unable to assess due to icy streets;  01/29/2020: 10 min; 03/20/2020 10 min; 05/14/2020: 10 min x 3 per day; 06/11/2020: 67mn x 3 per day; 07/15/2020: 1414m x 3 per day; 09/02/2020: 1072mx 3 (just standing rest breaks in between); 11/06/2019: 10 min x 3    Time 8    Period Weeks    Status On-going      PT LONG TERM GOAL #3   Title Patient will report worst pain of 3/10 during therapy to demonstrate reduced disability    Baseline 04/02/21 Worst 7/10; 12/22 worst 9/10; 11/09/2019: 9/10; 12/07/2019 7/10; 01/29/2020: 7/10; 05/14/2020: 0/10; 03/12/21: 5/10    Time 8    Period Weeks    Status On-going      PT LONG TERM GOAL #4   Title Patient will demonstrate ability to perform all bed mobility maneuvers without increase in pain for reduced disability with mobility and transfers with ADLs.    Baseline Reports pain with bed mobility supine <> sidelying L/R, sidelying to sitting EOB; 12/22 pt able to perform I without increased pain    Time 8    Period Weeks    Status Achieved      PT LONG TERM GOAL #5   Title Patient will improve 10MW speed to 1.0 m/sec to meet norms for minimal fall risk and full community ambulation.    Baseline 04/02/21 0.88 m/sec; 12/22 0.96 m/sec; 11/09/2019: deferred; 12/07/2019 0.90 m/sec; 01/29/2020: .9  m/sec; 03/20/2020: .14m/61m7/13/2021: . 9 m/s; 06/11/2020: .14m/s71m/13/2021: 1 m/s; 03/12/21:0.814m/s31mTime 8    Period Weeks    Status On-going      PT LONG TERM GOAL #6   Title Patient will increase 6MWT by 200 ft to demonstrate  improvement in functional capacity for transition to cardiac walking program    Baseline 03/12/21 6MWT = 1195; 1/7/20201: deferred; 12/07/2019 1095; 01/29/2020: deferred secondary to fatigue; 03/20/2020: 1070ft; 28f/2021: 1130ft; 158f2021: 800ft; 1/36f22: 973ft; 3/161f22: 1000ft    Ti53f    Period Weeks    Status On-going      PT LONG TERM GOAL #7   Title Patient will improve her 5xsts to under 15 sec to indicate singificnat improvement in LE functional strength and decrease in fall risk.    Baseline 24.5 sec; 03/20/2020: 19.19 sec; 06/13/2020: 14.92; 03/12/21: 12.91sec    Time 6    Period Weeks    Status Achieved      PT LONG TERM GOAL #8   Title Patient will improve FGA to 28/30 to indicate improvement in dynamic balance and improvement in stability.    Baseline 23/30; 06/11/2020: 25/30; 07/15/2020: 26/30; 09/02/2020: 27/30; 11/05/2020: 28/30    Time 6    Period Weeks    Status Achieved      PT LONG TERM GOAL  #9   TITLE Patient will be able to ascend/desend the stairs without use of UEs to increase safety and decrease fall risk with stair use.    Baseline requires use of UEs; 01/13/2021: able to perform with CGA; 03/12/21: able to perform 4 steps/up and down hands free supervision level    Time 6    Period Weeks    Status Deferred      PT LONG TERM GOAL  #10   TITLE Patient will improve her TUG scores to under 12 sec for normal tug;  15 sec for cognitive TUG to decrease fall risk overall    Baseline 01/13/21: 17.2 Regular ; 27.3 sec cognitive; 5/11 12.22sec regular (cognitive not assessed)    Time 6    Period Weeks    Status Deferred                   Plan - 05/26/21 1353     Clinical Impression Statement Pt tolerated session well evidenced by decrease in hip pain from 9/10 to 8/10 following therex. PT continued therex progression in order to increase hip strength and stability. Pt continues to demonstrate hip weakness and decreased activity tolerance evidenced by frequent need  of rest breaks. Patient required multimodal cueing to ensure safe and effective exercise progression. Continue PT POC.    Personal Factors and Comorbidities Age;Comorbidity 3+;Time since onset of injury/illness/exacerbation;Comorbidity 2;Other    Comorbidities MCI, HTN, CA, cataracts    Examination-Activity Limitations Bend;Sit;Squat;Stairs;Stand;Sleep    Examination-Participation Restrictions Cleaning;Community Activity    Stability/Clinical Decision Making Stable/Uncomplicated    Rehab Potential Fair    Clinical Impairments Affecting Rehab Potential MCI    PT Frequency 2x / week    PT Duration 6 weeks    PT Treatment/Interventions ADLs/Self Care Home Management;Therapeutic exercise;Patient/family education;Neuromuscular re-education;Therapeutic activities;Functional mobility training;Balance training;Manual techniques;Gait training;Stair training;Moist Heat;Electrical Stimulation;Cryotherapy;Taping;Dry needling;Energy conservation;Passive range of motion;Joint Manipulations    PT Next Visit Plan Discuss plan for DC    PT Home Exercise Plan no updates this visit    Consulted and Agree with Plan of Care Patient             Patient will benefit from skilled therapeutic intervention in order to improve the following deficits and impairments:  Decreased range of motion, Postural dysfunction, Pain, Abnormal gait, Decreased activity tolerance, Decreased coordination, Decreased endurance, Decreased balance, Decreased mobility, Difficulty walking, Hypomobility, Increased muscle spasms, Impaired perceived functional ability, Decreased strength, Impaired flexibility  Visit Diagnosis: Difficulty in walking, not elsewhere classified  Muscle weakness (generalized)     Problem List Patient Active Problem List   Diagnosis Date Noted   Elevated blood pressure reading 05/23/2021   Osteopenia of neck of right femur 09/06/2020   Obesity 08/29/2020   Parkinson disease ([place]) 02/22/2020   Mucinous  cystadenoma 12/24/2019   Bipolar disorder, in full remission, most recent episode depressed ([place]) 11/08/2019   Avitaminosis D 08/24/2019   Insomnia due to mental condition 05/31/2019   Bipolar I disorder, most recent episode depressed ([place]) 05/31/2019   Cognitive disorder 05/31/2019   Alcohol use disorder, moderate, in sustained remission ([place]) 05/31/2019   Elevated tumor markers 05/27/2019   GAD (generalized anxiety disorder) 04/10/2019   MDD (major depressive disorder), severe ([place]) 01/31/2019   High serum carbohydrate antigen 19-9 (CA19-9) 12/01/2018   Adjustment disorder with anxious mood 11/24/2018   Essential hypertension 11/24/2018   B12 deficiency 10/17/2018   Incisional hernia, without obstruction or gangrene 09/12/2018   Genetic testing 04/07/2018   Malignant neoplasm of upper-outer quadrant of right breast in female, estrogen receptor positive ([place]) 03/17/2018   Malignant neoplasm of upper-inner quadrant of right breast in female, estrogen receptor positive ([place]) 03/17/2018   History of psychosis 02/07/2018   History of insomnia 02/07/2018     Durwin Reges DPT Sharion Settler, SPT  Durwin Reges 05/26/2021, 2:19 PM  [facility] [address], [place], [address] Phone: [phone]   Fax:  [phone]  Name: Kendra [name] MRN: [id number] Date of Birth: 19-Apr-1946

## 2021-05-27 MED ORDER — TRAMADOL HCL 50 MG PO TABS: 50.0000 mg | ORAL_TABLET | Freq: Three times a day (TID) | ORAL | 0 refills | Status: AC | PRN

## 2021-06-01 ENCOUNTER — Other Ambulatory Visit: Payer: Self-pay | Admitting: Psychiatry

## 2021-06-01 DIAGNOSIS — F3176 Bipolar disorder, in full remission, most recent episode depressed: Secondary | ICD-10-CM

## 2021-06-04 NOTE — Telephone Encounter (Signed)
Pt calling in again regarding this medication. She states that PCP is her specialist for this medication and is requesting to have someone give a call back to explain further. Please advise.

## 2021-06-04 NOTE — Telephone Encounter (Signed)
Advised patient as below. Please refill. Thanks!

## 2021-06-05 NOTE — Telephone Encounter (Signed)
We discussed during her visit that PM&R and neurosurgery who she was referred to would manage her pain control and we were only giving a short acute supply. It was also noted last week that it was the last refill.

## 2021-06-06 DIAGNOSIS — M5416 Radiculopathy, lumbar region: Secondary | ICD-10-CM | POA: Diagnosis not present

## 2021-06-06 DIAGNOSIS — M5136 Other intervertebral disc degeneration, lumbar region: Secondary | ICD-10-CM | POA: Diagnosis not present

## 2021-06-06 DIAGNOSIS — M48062 Spinal stenosis, lumbar region with neurogenic claudication: Secondary | ICD-10-CM | POA: Diagnosis not present

## 2021-06-09 ENCOUNTER — Encounter: Payer: Self-pay | Admitting: Physical Therapy

## 2021-06-09 ENCOUNTER — Ambulatory Visit: Payer: Medicare PPO | Attending: Family Medicine | Admitting: Physical Therapy

## 2021-06-09 DIAGNOSIS — R262 Difficulty in walking, not elsewhere classified: Secondary | ICD-10-CM | POA: Diagnosis not present

## 2021-06-09 DIAGNOSIS — M6281 Muscle weakness (generalized): Secondary | ICD-10-CM | POA: Insufficient documentation

## 2021-06-09 NOTE — Therapy (Signed)
[facility] [address], [place], [address] Phone: [phone]   Fax:  [phone]  Physical Therapy Treatment  Patient Details  Name: Kendra [name] MRN: [id number] Date of Birth: [birth year] No data recorded  Encounter Date: 06/09/2021   PT End of Session - 06/09/21 1439     Visit Number 110    Number of Visits 150    Date for PT Re-Evaluation 06/25/21    Authorization Type Needs [employer] auth    Authorization Time Period 24 visits from 04/02/21- 06/27/21    Authorization - Visit Number 8    Authorization - Number of Visits 24    PT Start Time [id number]    PT Stop Time 1428    PT Time Calculation (min) 41 min    Equipment Utilized During Treatment Gait belt    Activity Tolerance Patient tolerated treatment well;No increased pain    Behavior During Therapy [facility] for tasks assessed/performed             Past Medical History:  Diagnosis Date   Anxiety    Breast cancer ([place]) 03/2018   right breast cancer at 11:00 and 1:00   Bursitis    bilateral hips and knees   Complication of anesthesia    Depression    Family history of breast cancer 03/24/2018   Family history of lung cancer 03/24/2018   History of alcohol dependence ([place]) 2007   no ETOH; resolved since 2007   History of hiatal hernia    History of psychosis 2014   due to sleep disturbance   Hypertension    Parkinson disease ([place])    Personal history of radiation therapy 06/2018-07/2018   right breast ca   PONV (postoperative nausea and vomiting)     Past Surgical History:  Procedure Laterality Date   BREAST BIOPSY Right 03/14/2018   11:00 DCIS and invasive ductal carcinoma   BREAST BIOPSY Right 03/14/2018   1:00 Invasive ductal carcinoma   BREAST LUMPECTOMY Right 04/27/2018   lumpectomy of 11 and 1:00 cancers, clear margins, negative LN   BREAST LUMPECTOMY WITH RADIOACTIVE SEED AND SENTINEL LYMPH NODE BIOPSY Right 04/27/2018   Procedure: RIGHT  BREAST LUMPECTOMY WITH RADIOACTIVE SEED X 2 AND RIGHT SENTINEL LYMPH NODE BIOPSY;  Surgeon: [name], MD;  Location: [facility];  Service: General;  Laterality: Right;   CATARACT EXTRACTION W/PHACO Left 08/30/2019   Procedure: CATARACT EXTRACTION PHACO AND INTRAOCULAR LENS PLACEMENT ([place]) LEFT panoptix toric  01:20.5  12.7%  10.26;  Surgeon: Leandrew Koyanagi, MD;  Location: [facility];  Service: Ophthalmology;  Laterality: Left;   CATARACT EXTRACTION W/PHACO Right 09/20/2019   Procedure: CATARACT EXTRACTION PHACO AND INTRAOCULAR LENS PLACEMENT ([facility]) RIGHT 5.71  00:36.4  15.7%;  Surgeon: Leandrew Koyanagi, MD;  Location: [facility];  Service: Ophthalmology;  Laterality: Right;   LAPAROTOMY N/A 02/10/2018   Procedure: EXPLORATORY LAPAROTOMY;  Surgeon: Isabel Caprice, MD;  Location: [facility];  Service: Gynecology;  Laterality: N/A;   MASS EXCISION  01/2018   abdominal   OVARIAN CYST REMOVAL     SALPINGOOPHORECTOMY Bilateral 02/10/2018   Procedure: BILATERAL SALPINGO OOPHORECTOMY; PERITONEAL WASHINGS;  Surgeon: Isabel Caprice, MD;  Location: [facility];  Service: Gynecology;  Laterality: Bilateral;   TONSILLECTOMY     TONSILLECTOMY AND ADENOIDECTOMY  1953    There were no vitals filed for this visit.   Subjective Assessment - 06/09/21 1354     Subjective Pt reports  feeling well today other than having pain in her hip that aches into her thighs and low back. She reports her hip pain at 9/10 on NPRS. She reports active participation in HEP but reports she is slow at completing it.    Pertinent History Patient report unclear regarding onset. Visited MD on Sept 16 and received injection with relief for approximately 1 month. Reports she cannot receive an additional injection pending eye surgery. Received dx from MD of hip bursitis in B hips. Reports pain with walking, stanindg, sitting 5/10 current 7/10 worst 3/10 best, with temporary relief when she is  sitting "just right" and with naproxen. Agg: activity. Cormorbidities: hx of breast cancer, HTN, MCI, insomnia, cataracts    Limitations Sitting;Standing;Walking;House hold activities             Therex  Nu Step L2 x 5 min Seat 8  Standing half jacks 1 x 10 reps  Reciprocal step and reach with UE across midline 1 x 10 reps with visual and verbal cues to correct  Sit to Stand (w/ airex)+ chest press #2 1 x 10 reps with cueing to initiate with good carry over; 1 x 10 reps without airx in chair  Standing hip ABD with RTB 2 x 10 reps  Lateral stepping onto airex 1 x 10 reps leading with R & L   Stepping over hurdles with one foot in each space 2 x 2 trials with CGA and assist to sequence                             PT Education - 06/09/21 1440     Education provided Yes    Education Details Pt educated on therex form/technique    Person(s) Educated Patient    Methods Explanation;Demonstration    Comprehension Verbalized understanding;Returned demonstration              PT Short Term Goals - 01/14/21 1029       PT SHORT TERM GOAL #1   Title Patient will demonstrate complaince with HEP to speed recovery and reduce total number of visits.    Baseline HEP given; 01/14/2021: Independent with HEP    Time 2    Period Weeks    Status Achieved    Target Date 09/27/19               PT Long Term Goals - 04/02/21 1129       PT LONG TERM GOAL #1   Title Patient will improve hip strength to 4/5 grossly to facilitate improved mobility with ADLs.    Baseline 3, 4-; 12/22 deferred to NV; 11/09/2019: 4-/5; 12/07/2019 L hip flexor 4, abd and ext 3+; 01/29/2020: 4/5 for most hip motions, 3+/5 for abd, ext; 03/20/2020: 4/5 for hip movements    Time 8    Period Weeks    Status Achieved      PT LONG TERM GOAL #2   Title Patient will demonstrate walking tolerance increased to 30 minutes without increase in symptoms to demonstrate improved ability to perform  walking program for cardiovascular health.    Baseline 04/02/21 10 minute tolerance (patient report); 12/22 10 minutes (per pt report); 11/09/2019: 7 min; 12/07/2019 unable to assess due to icy streets;  01/29/2020: 10 min; 03/20/2020 10 min; 05/14/2020: 10 min x 3 per day; 06/11/2020: 66mn x 3 per day; 07/15/2020: 122m x 3 per day; 09/02/2020: 1070mx 3 (just standing rest breaks in  between); 11/06/2019: 10 min x 3    Time 8    Period Weeks    Status On-going      PT LONG TERM GOAL #3   Title Patient will report worst pain of 3/10 during therapy to demonstrate reduced disability    Baseline 04/02/21 Worst 7/10; 12/22 worst 9/10; 11/09/2019: 9/10; 12/07/2019 7/10; 01/29/2020: 7/10; 05/14/2020: 0/10; 03/12/21: 5/10    Time 8    Period Weeks    Status On-going      PT LONG TERM GOAL #4   Title Patient will demonstrate ability to perform all bed mobility maneuvers without increase in pain for reduced disability with mobility and transfers with ADLs.    Baseline Reports pain with bed mobility supine <> sidelying L/R, sidelying to sitting EOB; 12/22 pt able to perform I without increased pain    Time 8    Period Weeks    Status Achieved      PT LONG TERM GOAL #5   Title Patient will improve 10MW speed to 1.0 m/sec to meet norms for minimal fall risk and full community ambulation.    Baseline 04/02/21 0.88 m/sec; 12/22 0.96 m/sec; 11/09/2019: deferred; 12/07/2019 0.90 m/sec; 01/29/2020: .9  m/sec; 03/20/2020: .27ms; 05/14/2020: . 9 m/s; 06/11/2020: .946m; 07/15/2020: 1 m/s; 03/12/21:0.8911m   Time 8    Period Weeks    Status On-going      PT LONG TERM GOAL #6   Title Patient will increase 6MWT by 200 ft to demonstrate improvement in functional capacity for transition to cardiac walking program    Baseline 03/12/21 6MWT = 1195; 1/7/20201: deferred; 12/07/2019 1095; 01/29/2020: deferred secondary to fatigue; 03/20/2020: 1070f62f/13/2021: 1130ft74f/12/2019: 800ft;60f/2022: 973ft; 29f/2022: 1000ft   13fe 8    Period Weeks     Status On-going      PT LONG TERM GOAL #7   Title Patient will improve her 5xsts to under 15 sec to indicate singificnat improvement in LE functional strength and decrease in fall risk.    Baseline 24.5 sec; 03/20/2020: 19.19 sec; 06/13/2020: 14.92; 03/12/21: 12.91sec    Time 6    Period Weeks    Status Achieved      PT LONG TERM GOAL #8   Title Patient will improve FGA to 28/30 to indicate improvement in dynamic balance and improvement in stability.    Baseline 23/30; 06/11/2020: 25/30; 07/15/2020: 26/30; 09/02/2020: 27/30; 11/05/2020: 28/30    Time 6    Period Weeks    Status Achieved      PT LONG TERM GOAL  #9   TITLE Patient will be able to ascend/desend the stairs without use of UEs to increase safety and decrease fall risk with stair use.    Baseline requires use of UEs; 01/13/2021: able to perform with CGA; 03/12/21: able to perform 4 steps/up and down hands free supervision level    Time 6    Period Weeks    Status Deferred      PT LONG TERM GOAL  #10   TITLE Patient will improve her TUG scores to under 12 sec for normal tug; 15 sec for cognitive TUG to decrease fall risk overall    Baseline 01/13/21: 17.2 Regular ; 27.3 sec cognitive; 5/11 12.22sec regular (cognitive not assessed)    Time 6    Period Weeks    Status Deferred                   Plan - 06/09/21 1442  Clinical Impression Statement Pt tolerated session well with no reported increase in hip pain following therex. PT continued therex progression of LE strengthening, sequencing, and balance. Pt continues to demonstrate LE weakness and decreased activity tolerance evidenced by frequent need for rest breaks. PT will continue to utilize multimodal cueing to ensure safe exercise progression.    Personal Factors and Comorbidities Age;Comorbidity 3+;Time since onset of injury/illness/exacerbation;Comorbidity 2;Other    Comorbidities MCI, HTN, CA, cataracts    Examination-Activity Limitations  Bend;Sit;Squat;Stairs;Stand;Sleep    Examination-Participation Restrictions Cleaning;Community Activity    Stability/Clinical Decision Making Stable/Uncomplicated    Clinical Decision Making Moderate    Rehab Potential Fair    Clinical Impairments Affecting Rehab Potential MCI    PT Frequency 2x / week    PT Duration 6 weeks    PT Treatment/Interventions ADLs/Self Care Home Management;Therapeutic exercise;Patient/family education;Neuromuscular re-education;Therapeutic activities;Functional mobility training;Balance training;Manual techniques;Gait training;Stair training;Moist Heat;Electrical Stimulation;Cryotherapy;Taping;Dry needling;Energy conservation;Passive range of motion;Joint Manipulations    PT Next Visit Plan Discuss plan for DC    PT Home Exercise Plan no updates this visit    Consulted and Agree with Plan of Care Patient             Patient will benefit from skilled therapeutic intervention in order to improve the following deficits and impairments:  Decreased range of motion, Postural dysfunction, Pain, Abnormal gait, Decreased activity tolerance, Decreased coordination, Decreased endurance, Decreased balance, Decreased mobility, Difficulty walking, Hypomobility, Increased muscle spasms, Impaired perceived functional ability, Decreased strength, Impaired flexibility  Visit Diagnosis: Difficulty in walking, not elsewhere classified     Problem List Patient Active Problem List   Diagnosis Date Noted   Elevated blood pressure reading 05/23/2021   Osteopenia of neck of right femur 09/06/2020   Obesity 08/29/2020   Parkinson disease ([place]) 02/22/2020   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   Bipolar disorder, in full remission, most recent episode depressed ([place]) 11/08/2019   Avitaminosis D 08/24/2019   Insomnia due to mental condition 05/31/2019   Bipolar I disorder, most recent episode depressed ([place]) 05/31/2019   Cognitive disorder 05/31/2019   Alcohol use disorder,  moderate, in sustained remission ([place]) 05/31/2019   Elevated tumor markers 05/27/2019   GAD (generalized anxiety disorder) 04/10/2019   MDD (major depressive disorder), severe ([place]) 01/31/2019   High serum carbohydrate antigen 19-9 (CA19-9) 12/01/2018   Adjustment disorder with anxious mood 11/24/2018   Essential hypertension 11/24/2018   B12 deficiency 10/17/2018   Incisional hernia, without obstruction or gangrene 09/12/2018   Genetic testing 04/07/2018   Malignant neoplasm of upper-outer quadrant of right breast in female, estrogen receptor positive ([place]) 03/17/2018   Malignant neoplasm of upper-inner quadrant of right breast in female, estrogen receptor positive ([place]) 03/17/2018   History of psychosis 02/07/2018   History of insomnia 02/07/2018     Durwin Reges DPT Sharion Settler, SPT  Durwin Reges 06/09/2021, 5:01 PM  [facility] [address] [place], [address] Phone: [phone]   Fax:  [phone]  Name: Kendra [name] MRN: [id number] Date of Birth: August 04, 1946

## 2021-06-10 ENCOUNTER — Ambulatory Visit (INDEPENDENT_AMBULATORY_CARE_PROVIDER_SITE_OTHER): Payer: Medicare PPO | Admitting: Licensed Clinical Social Worker

## 2021-06-10 ENCOUNTER — Other Ambulatory Visit: Payer: Self-pay

## 2021-06-10 DIAGNOSIS — F5105 Insomnia due to other mental disorder: Secondary | ICD-10-CM | POA: Diagnosis not present

## 2021-06-10 DIAGNOSIS — F3176 Bipolar disorder, in full remission, most recent episode depressed: Secondary | ICD-10-CM | POA: Diagnosis not present

## 2021-06-10 DIAGNOSIS — F411 Generalized anxiety disorder: Secondary | ICD-10-CM

## 2021-06-10 NOTE — Telephone Encounter (Signed)
Patient called back and message from Dr. Brita Romp from 5 days ago reviewed with patient and she was given a short acute supply of medication to manage her pain . Patient reports she would like something to help with pain until her future visit with neurology on 06/27/21 to receive an injection for pain in her back. No available OV available until Oct. With PCP. Please put patient on list for appt for this week if any cancellations noted. Encouraged  alternative measures , cool or heat  to pain site, no more than 20 minutes for pain. Patient reports she would like a call back if any appt become available.

## 2021-06-10 NOTE — Progress Notes (Signed)
Virtual Visit via Audio Note  I connected with [name] on 06/10/21 at  1:00 PM EDT by an audio enabled telemedicine application and verified that I am speaking with the correct person using two identifiers.  Location: Patient: home Provider: remote office [place], [place])   I discussed the limitations of evaluation and management by telemedicine and the availability of in person appointments. The patient expressed understanding and agreed to proceed.  I discussed the assessment and treatment plan with the patient. The patient was provided an opportunity to ask questions and all were answered. The patient agreed with the plan and demonstrated an understanding of the instructions.   The patient was advised to call back or seek an in-person evaluation if the symptoms worsen or if the condition fails to improve as anticipated.  I provided 40 minutes of non-face-to-face time during this encounter.   Maxum Cassarino R Stefon Ramthun, LCSW   THERAPIST PROGRESS NOTE  Session Time: 1-1:40p  Participation Level: Active  Behavioral Response: NAAlertAnxious and Depressed  Type of Therapy: Individual Therapy  Treatment Goals addressed: Anxiety and Coping  Interventions: Supportive  Summary: Colandra Hitchcock is a 75 y.o. female who presents with continuing progress with managing symptoms of mood fluctuations and depression. Patient reports that overall mood has been stable and that she is managing stress and anxiety well. Patient reports fair quality and quantity of sleep. Reviewed sleep hygiene, and patient feels that she takes her medications nightly, does not drink alcohol, does not drink caffeine throughout the day, does not watch TV at night, and only takes 15 minute naps throughout the day. Patient reports that physical activity is limited due to continuing back pain. Patient reports that she has an appointment to get some spinal injections in August, and she is hoping that this will be helpful for  her period patient continues her physical therapy specifically for individuals with Parkinson's, and patient states that she feels this is very helpful for her period patient is also continuing to massage therapy on a regular basis. Patient denies any recent falls either in the house or outside of the house. Discussed coping mechanisms for managing chronic pain and coping with limited physical ability. Patient reports that her spouse continues to be an excellent support person. Patient reports that she does make time for self-care activities. Continued recommendations are as follows: self care behaviors, positive social engagements, focusing on overall work/home/life balance, and focusing on positive physical and emotional wellness.   .   Suicidal/Homicidal: No  Therapist Response: Patient is able to identify anxiety coping mechanisms that have been successful in the past and continue their use. Patient has developed good behavioral coping and distraction strategies for anxiety and mood management. Patient is engaging in recreational activities that reflect increased energy and interest. Patient is utilizing behavioral strategies to overcome depression symptoms.These behaviors are reflective of both personal growth and progress. Treatment to continue as indicated.   Plan: Return again in 4 weeks.  Diagnosis: Axis I: Bipolar disorder, depressed, in remission.    Axis II: No diagnosis    [place], LCSW 06/10/2021

## 2021-06-11 NOTE — Telephone Encounter (Signed)
Ok to schedule with Daneil Dan to discuss. Could be virtual if that is better

## 2021-06-12 DIAGNOSIS — H903 Sensorineural hearing loss, bilateral: Secondary | ICD-10-CM | POA: Diagnosis not present

## 2021-06-12 DIAGNOSIS — H6123 Impacted cerumen, bilateral: Secondary | ICD-10-CM | POA: Diagnosis not present

## 2021-06-12 NOTE — Telephone Encounter (Signed)
Appt scheduled for 06/18/21 with Daneil Dan.

## 2021-06-16 ENCOUNTER — Ambulatory Visit
Admission: RE | Admit: 2021-06-16 | Discharge: 2021-06-16 | Disposition: A | Discharge status: Home / Self Care | Payer: Medicare PPO | Source: Ambulatory Visit | Attending: Oncology | Admitting: Oncology

## 2021-06-16 ENCOUNTER — Other Ambulatory Visit: Payer: Self-pay

## 2021-06-16 DIAGNOSIS — Z853 Personal history of malignant neoplasm of breast: Secondary | ICD-10-CM | POA: Insufficient documentation

## 2021-06-16 DIAGNOSIS — Z1231 Encounter for screening mammogram for malignant neoplasm of breast: Secondary | ICD-10-CM | POA: Diagnosis not present

## 2021-06-16 DIAGNOSIS — Z170 Estrogen receptor positive status [ER+]: Secondary | ICD-10-CM

## 2021-06-16 DIAGNOSIS — C50411 Malignant neoplasm of upper-outer quadrant of right female breast: Secondary | ICD-10-CM

## 2021-06-18 ENCOUNTER — Ambulatory Visit: Payer: Medicare PPO | Admitting: Family Medicine

## 2021-06-18 ENCOUNTER — Other Ambulatory Visit: Payer: Self-pay

## 2021-06-18 ENCOUNTER — Encounter: Payer: Self-pay | Admitting: Family Medicine

## 2021-06-18 VITALS — BP 155/77 | HR 76 | Temp 98.6°F | Resp 16 | Wt 212.5 lb

## 2021-06-18 DIAGNOSIS — E669 Obesity, unspecified: Secondary | ICD-10-CM | POA: Diagnosis not present

## 2021-06-18 DIAGNOSIS — F3176 Bipolar disorder, in full remission, most recent episode depressed: Secondary | ICD-10-CM

## 2021-06-18 DIAGNOSIS — I10 Essential (primary) hypertension: Secondary | ICD-10-CM | POA: Diagnosis not present

## 2021-06-18 DIAGNOSIS — F09 Unspecified mental disorder due to known physiological condition: Secondary | ICD-10-CM | POA: Diagnosis not present

## 2021-06-18 DIAGNOSIS — F5105 Insomnia due to other mental disorder: Secondary | ICD-10-CM | POA: Diagnosis not present

## 2021-06-18 DIAGNOSIS — G8929 Other chronic pain: Secondary | ICD-10-CM | POA: Diagnosis not present

## 2021-06-18 DIAGNOSIS — G20A1 Parkinson's disease without dyskinesia, without mention of fluctuations: Secondary | ICD-10-CM

## 2021-06-18 DIAGNOSIS — G20 Parkinson's disease: Secondary | ICD-10-CM | POA: Diagnosis not present

## 2021-06-18 DIAGNOSIS — Z6833 Body mass index (BMI) 33.0-33.9, adult: Secondary | ICD-10-CM

## 2021-06-18 DIAGNOSIS — M545 Low back pain, unspecified: Secondary | ICD-10-CM | POA: Diagnosis not present

## 2021-06-18 DIAGNOSIS — E66811 Obesity, class 1: Secondary | ICD-10-CM

## 2021-06-18 MED ORDER — GABAPENTIN 100 MG PO CAPS: 100.0000 mg | ORAL_CAPSULE | Freq: Every day | ORAL | 0 refills | Status: DC

## 2021-06-18 MED ORDER — GABAPENTIN 100 MG PO CAPS: 300.0000 mg | ORAL_CAPSULE | Freq: Three times a day (TID) | ORAL | 0 refills | Status: DC

## 2021-06-18 MED ORDER — DICLOFENAC SODIUM 1 % EX GEL: 4.0000 g | Freq: Four times a day (QID) | CUTANEOUS | 0 refills | Status: DC | PRN

## 2021-06-18 MED ORDER — TRAMADOL HCL 50 MG PO TABS: 50.0000 mg | ORAL_TABLET | Freq: Three times a day (TID) | ORAL | 0 refills | Status: AC | PRN

## 2021-06-18 MED ORDER — MELOXICAM 15 MG PO TABS
15.0000 mg | ORAL_TABLET | Freq: Every day | ORAL | 0 refills | Status: DC
Start: 2021-06-18 — End: 2021-07-15

## 2021-06-18 NOTE — Assessment & Plan Note (Signed)
Chronic concern Stable with trazodone and Risperdal Additional gabapentin provided for pain relief at night

## 2021-06-18 NOTE — Assessment & Plan Note (Signed)
Encouraged healthy diet and aerobic exercise, pt limited by pain Continue to work with PT

## 2021-06-18 NOTE — Progress Notes (Signed)
Established patient visit   Patient: Kendra [name]   DOB: [birth year]   75 y.o. Female  MRN: [id number] Visit Date: 06/18/2021  Today's healthcare provider: Gwyneth Sprout, FNP   Chief Complaint  Patient presents with   Back Pain   Subjective  -------------------------------------------------------------------------------------------------------------------- Back Pain This is a chronic problem. The problem has been gradually worsening since onset. The pain is present in the lumbar spine. The quality of the pain is described as stabbing. The symptoms are aggravated by position, lying down and standing. Pertinent negatives include no abdominal pain, bladder incontinence, bowel incontinence, chest pain, dysuria, fever, headaches, leg pain, numbness, paresis, paresthesias, pelvic pain, perianal numbness, tingling, weakness or weight loss. Treatments tried: Tramadol. The treatment provided moderate relief.       Medications: Outpatient Medications Prior to Visit  Medication Sig   Acetaminophen 500 MG capsule Take 1,000 mg by mouth every 6 (six) hours as needed.    carbidopa-levodopa (SINEMET CR) 50-200 MG tablet Take by mouth 4 (four) times daily.   carbidopa-levodopa (SINEMET IR) 25-100 MG tablet Take 1 tablet by mouth at bedtime.    cyanocobalamin (,VITAMIN B-12,) 1000 MCG/ML injection INJECT 1 ML INTO THE MUSCLE EVERY 30 DAYS   denosumab (PROLIA) 60 MG/ML SOSY injection Inject 60 mg into the skin every 6 (six) months. takes annually   FLUoxetine HCl 60 MG TABS Take 60 mg by mouth daily.   letrozole (FEMARA) 2.5 MG tablet Take 1 tablet (2.5 mg total) by mouth daily.   lisinopril-hydrochlorothiazide (ZESTORETIC) 20-12.5 MG tablet Take 1 tablet by mouth daily.   NYAMYC powder APPLY TOPICALLY THREE TIMES A DAY   polyethylene glycol (MIRALAX / GLYCOLAX) 17 g packet Take 17 g by mouth daily.   risperiDONE (RISPERDAL) 1 MG tablet Take 0.5-1 tablets (0.5-1 mg total) by mouth as directed.  Start taking half tablet daily AM and 1 tablet at bedtime   traZODone (DESYREL) 100 MG tablet Take 1 tablet (100 mg total) by mouth at bedtime.   [DISCONTINUED] gabapentin (NEURONTIN) 100 MG capsule Take 100 mg by mouth. 2 capsules 3 times a day   [DISCONTINUED] naproxen sodium (ALEVE) 220 MG tablet Take 220 mg by mouth 2 (two) times daily as needed.    Facility-Administered Medications Prior to Visit  Medication Dose Route Frequency Provider   lidocaine (PF) (XYLOCAINE) 1 % injection 4 mL  4 mL Intradermal Once Bacigalupo, Dionne Bucy, MD   lidocaine (PF) (XYLOCAINE) 1 % injection 4 mL  4 mL Intradermal Once Virginia Crews, MD    Review of Systems  Constitutional:  Negative for fever and weight loss.  Cardiovascular:  Negative for chest pain.  Gastrointestinal:  Negative for abdominal pain and bowel incontinence.  Genitourinary:  Negative for bladder incontinence, dysuria and pelvic pain.  Musculoskeletal:  Positive for back pain.  Neurological:  Negative for tingling, weakness, numbness, headaches and paresthesias.      Objective  -------------------------------------------------------------------------------------------------------------------- BP (!) 155/77   Pulse 76   Temp 98.6 F (37 C) (Oral)   Resp 16   Wt 212 lb 8 oz (96.4 kg)   SpO2 98%   BMI 34.30 kg/m     Physical Exam Vitals and nursing note reviewed.  Constitutional:      General: She is not in acute distress.    Appearance: Normal appearance. She is obese. She is not ill-appearing, toxic-appearing or diaphoretic.  HENT:     Head: Normocephalic and atraumatic.  Cardiovascular:  Rate and Rhythm: Normal rate and regular rhythm.     Pulses: Normal pulses.          Radial pulses are 2+ on the right side and 2+ on the left side.     Heart sounds: Normal heart sounds, S1 normal and S2 normal. No murmur heard.   No friction rub. No gallop.  Pulmonary:     Effort: Pulmonary effort is normal.     Breath  sounds: Normal breath sounds.  Chest:     Comments: Asking for mammo results; not read yet Abdominal:     General: Bowel sounds are normal.     Palpations: Abdomen is soft.  Musculoskeletal:        General: Normal range of motion.  Skin:    General: Skin is warm and dry.     Capillary Refill: Capillary refill takes less than 2 seconds.     Comments: Multiple AK- non concerning to patient at this time  Neurological:     General: No focal deficit present.     Mental Status: She is alert and oriented to person, place, and time.     Comments: Patient with Parkinson's diagnosis within the last year Seeing PT and Neurology Starting speech soon No issues with medications  Psychiatric:        Attention and Perception: Attention normal.        Mood and Affect: Mood normal. Affect is flat.        Speech: Speech is delayed.        Behavior: Behavior normal. Behavior is cooperative.        Thought Content: Thought content normal.        Cognition and Memory: Cognition is impaired.        Judgment: Judgment normal.     No results found for any visits on 06/18/21.  Assessment & Plan  ---------------------------------------------------------------------------------------------------------------------- Problem List Items Addressed This Visit       Cardiovascular and Mediastinum   Essential hypertension    Patient in pain BP elevated today on exam Continue to monitor as previously controlled        Nervous and Auditory   Parkinson disease ([place]) - Primary    Starting a new PT/Speech program Per husband, neurologist is happy with stability of progression Continue medications as prescribed      Relevant Medications   gabapentin (NEURONTIN) 100 MG capsule   gabapentin (NEURONTIN) 100 MG capsule   RESOLVED: Cognitive disorder     Other   Insomnia due to mental condition    Chronic concern Stable with trazodone and Risperdal Additional gabapentin provided for pain relief at  night      Bipolar disorder, in full remission, most recent episode depressed ([facility])    Chronic condition Stable at this time Continue medications as prescribed      Obesity    Encouraged healthy diet and aerobic exercise, pt limited by pain Continue to work with PT      Chronic bilateral low back pain    Planned injection in 2 weeks  Start tylenol up to 4,'000mg'$ / or 4g per day Ex- 2 extra strength (500+500) up to 4 times/day  Stop aleve; start mobic  Discussed holding all other NSAIDs  Use voltaren for pinpoint pain  Increase gabapentin throughout day with additional for qhs  Tramadol provided for breakthrough      Relevant Medications   traMADol (ULTRAM) 50 MG tablet   meloxicam (MOBIC) 15 MG tablet  Return if symptoms worsen or fail to improve.      Vonna Kotyk, FNP, have reviewed all documentation for this visit. The documentation on 06/18/21 for the exam, diagnosis, procedures, and orders are all accurate and complete.    Gwyneth Sprout, [place] [phone] (phone) [phone] (fax)  [place]

## 2021-06-18 NOTE — Assessment & Plan Note (Signed)
Chronic condition Stable at this time Continue medications as prescribed

## 2021-06-18 NOTE — Assessment & Plan Note (Signed)
Planned injection in 2 weeks  Start tylenol up to 4,'000mg'$ / or 4g per day Ex- 2 extra strength (500+500) up to 4 times/day  Stop aleve; start mobic  Discussed holding all other NSAIDs  Use voltaren for pinpoint pain  Increase gabapentin throughout day with additional for qhs  Tramadol provided for breakthrough

## 2021-06-18 NOTE — Assessment & Plan Note (Signed)
Starting a new PT/Speech program Per husband, neurologist is happy with stability of progression Continue medications as prescribed

## 2021-06-18 NOTE — Assessment & Plan Note (Signed)
Patient in pain BP elevated today on exam Continue to monitor as previously controlled

## 2021-06-21 ENCOUNTER — Other Ambulatory Visit: Payer: Self-pay | Admitting: Family Medicine

## 2021-06-23 ENCOUNTER — Other Ambulatory Visit: Payer: Self-pay | Admitting: Psychiatry

## 2021-06-23 ENCOUNTER — Ambulatory Visit: Payer: Medicare PPO | Admitting: Physical Therapy

## 2021-06-23 DIAGNOSIS — R262 Difficulty in walking, not elsewhere classified: Secondary | ICD-10-CM

## 2021-06-23 DIAGNOSIS — M6281 Muscle weakness (generalized): Secondary | ICD-10-CM

## 2021-06-23 DIAGNOSIS — F411 Generalized anxiety disorder: Secondary | ICD-10-CM

## 2021-06-23 DIAGNOSIS — F5105 Insomnia due to other mental disorder: Secondary | ICD-10-CM

## 2021-06-23 NOTE — Therapy (Signed)
[facility] [address], [place], [address] Phone: [phone]   Fax:  [phone]  Physical Therapy Treatment Discharge Summary Reporting Period 03/12/21-06/23/21  Patient Details  Name: Kendra [name] MRN: [id number] Date of Birth: 1946-03-24 No data recorded  Encounter Date: 06/23/2021   PT End of Session - 06/23/21 1355     Visit Number 111    Number of Visits 150    Date for PT Re-Evaluation 06/25/21    Authorization Time Period 24 visits from 04/02/21- 06/27/21    Authorization - Visit Number 9    Authorization - Number of Visits 24    PT Start Time 1300    PT Stop Time 1346    PT Time Calculation (min) 46 min    Equipment Utilized During Treatment Gait belt    Activity Tolerance Patient tolerated treatment well;No increased pain    Behavior During Therapy [facility] for tasks assessed/performed             Past Medical History:  Diagnosis Date   Anxiety    Breast cancer ([place]) 03/2018   right breast cancer at 11:00 and 1:00   Bursitis    bilateral hips and knees   Complication of anesthesia    Depression    Family history of breast cancer 03/24/2018   Family history of lung cancer 03/24/2018   History of alcohol dependence ([place]) 2007   no ETOH; resolved since 2007   History of hiatal hernia    History of psychosis 2014   due to sleep disturbance   Hypertension    Parkinson disease ([place])    Personal history of radiation therapy 06/2018-07/2018   right breast ca   PONV (postoperative nausea and vomiting)     Past Surgical History:  Procedure Laterality Date   BREAST BIOPSY Right 03/14/2018   11:00 DCIS and invasive ductal carcinoma   BREAST BIOPSY Right 03/14/2018   1:00 Invasive ductal carcinoma   BREAST LUMPECTOMY Right 04/27/2018   lumpectomy of 11 and 1:00 cancers, clear margins, negative LN   BREAST LUMPECTOMY WITH RADIOACTIVE SEED AND SENTINEL LYMPH NODE BIOPSY Right 04/27/2018    Procedure: RIGHT BREAST LUMPECTOMY WITH RADIOACTIVE SEED X 2 AND RIGHT SENTINEL LYMPH NODE BIOPSY;  Surgeon: [name], MD;  Location: [facility];  Service: General;  Laterality: Right;   CATARACT EXTRACTION W/PHACO Left 08/30/2019   Procedure: CATARACT EXTRACTION PHACO AND INTRAOCULAR LENS PLACEMENT ([place]) LEFT panoptix toric  01:20.5  12.7%  10.26;  Surgeon: Leandrew Koyanagi, MD;  Location: [facility];  Service: Ophthalmology;  Laterality: Left;   CATARACT EXTRACTION W/PHACO Right 09/20/2019   Procedure: CATARACT EXTRACTION PHACO AND INTRAOCULAR LENS PLACEMENT ([facility]) RIGHT 5.71  00:36.4  15.7%;  Surgeon: Leandrew Koyanagi, MD;  Location: [facility];  Service: Ophthalmology;  Laterality: Right;   LAPAROTOMY N/A 02/10/2018   Procedure: EXPLORATORY LAPAROTOMY;  Surgeon: Isabel Caprice, MD;  Location: [facility];  Service: Gynecology;  Laterality: N/A;   MASS EXCISION  01/2018   abdominal   OVARIAN CYST REMOVAL     SALPINGOOPHORECTOMY Bilateral 02/10/2018   Procedure: BILATERAL SALPINGO OOPHORECTOMY; PERITONEAL WASHINGS;  Surgeon: Isabel Caprice, MD;  Location: [facility];  Service: Gynecology;  Laterality: Bilateral;   TONSILLECTOMY     TONSILLECTOMY AND ADENOIDECTOMY  1953    There were no vitals filed for this visit.   Subjective Assessment - 06/23/21 1306     Subjective Pt reports feeling a little  to a lot "wobbly" today. She states she has pain when changing position from sitting to standing. She reports 9/10 pain on NPRS.    Pertinent History Patient report unclear regarding onset. Visited MD on Sept 16 and received injection with relief for approximately 1 month. Reports she cannot receive an additional injection pending eye surgery. Received dx from MD of hip bursitis in B hips. Reports pain with walking, stanindg, sitting 5/10 current 7/10 worst 3/10 best, with temporary relief when she is sitting "just right" and with naproxen. Agg: activity.  Cormorbidities: hx of breast cancer, HTN, MCI, insomnia, cataracts    Limitations Sitting;Standing;Walking;House hold activities    How long can you sit comfortably? Pain constant, reduced with neutral pelvic tilt    How long can you stand comfortably? Pain constant, increases with time    How long can you walk comfortably? Pain constant, increases with time              Therex   Nu step L3 x 5 min UE & LE for gentle LE strengthening  Standing marches holding handrail 2 x 45 sec placing foot above corresponding cone   Standing half jacks attempted but unable due to loss of balance; min assist to correct  Seated half jacks 2# AW 2 x 30 sec with therapist mimicking   STS with airex in seat + 2# DB press 1 x 8 reps  Performance   10 MWT: 10.8 sec;10.82 sec (0.31m/s) 5XSTS: 21.89 sec TUG: 15 sec Stair Negotiation: ascend without UE/ descend with single UE; supervision                           PT Education - 06/23/21 1538     Education provided Yes    Education Details d/c recs    Person(s) Educated Patient    Methods Explanation;Demonstration;Verbal cues    Comprehension Verbalized understanding;Verbal cues required;Returned demonstration              PT Short Term Goals - 01/14/21 1029       PT SHORT TERM GOAL #1   Title Patient will demonstrate complaince with HEP to speed recovery and reduce total number of visits.    Baseline HEP given; 01/14/2021: Independent with HEP    Time 2    Period Weeks    Status Achieved    Target Date 09/27/19               PT Long Term Goals - 06/23/21 1308       PT LONG TERM GOAL #1   Title Patient will improve hip strength to 4/5 grossly to facilitate improved mobility with ADLs.    Baseline 3, 4-; 12/22 deferred to NV; 11/09/2019: 4-/5; 12/07/2019 L hip flexor 4, abd and ext 3+; 01/29/2020: 4/5 for most hip motions, 3+/5 for abd, ext; 03/20/2020: 4/5 for hip movements    Time 8    Period Weeks     Status Achieved      PT LONG TERM GOAL #2   Title Patient will demonstrate walking tolerance increased to 30 minutes without increase in symptoms to demonstrate improved ability to perform walking program for cardiovascular health.    Baseline 04/02/21 10 minute tolerance (patient report); 12/22 10 minutes (per pt report); 11/09/2019: 7 min; 12/07/2019 unable to assess due to icy streets;  01/29/2020: 10 min; 03/20/2020 10 min; 05/14/2020: 10 min x 3 per day; 06/11/2020: 55min x 3 per day; 07/15/2020:  77min x 3 per day; 09/02/2020: 51min x 3 (just standing rest breaks in between); 11/06/2019: 10 min x 3; 8/22: patient reports walking 37min x 3 rest breaks without increase in pain.    Time 8    Period Weeks    Status Achieved      PT LONG TERM GOAL #3   Title Patient will report worst pain of 3/10 during therapy to demonstrate reduced disability    Baseline 04/02/21 Worst 7/10; 12/22 worst 9/10; 11/09/2019: 9/10; 12/07/2019 7/10; 01/29/2020: 7/10; 05/14/2020: 0/10; 03/12/21: 5/10    Time 8    Period Weeks    Status Achieved      PT LONG TERM GOAL #4   Title Patient will demonstrate ability to perform all bed mobility maneuvers without increase in pain for reduced disability with mobility and transfers with ADLs.    Baseline Reports pain with bed mobility supine <> sidelying L/R, sidelying to sitting EOB; 12/22 pt able to perform I without increased pain    Period Weeks    Status Achieved      PT LONG TERM GOAL #5   Title Patient will improve 10MW speed to 1.0 m/sec to meet norms for minimal fall risk and full community ambulation.    Baseline 04/02/21 0.88 m/sec; 12/22 0.96 m/sec; 11/09/2019: deferred; 12/07/2019 0.90 m/sec; 01/29/2020: .9  m/sec; 03/20/2020: .29m/s; 05/14/2020: . 9 m/s; 06/11/2020: .17m/s; 07/15/2020: 1 m/s; 03/12/21:0.18m/s; 8/22: 0.51m/s    Time 8    Period Weeks    Status Not Met      PT LONG TERM GOAL #6   Title Patient will increase 6MWT by 200 ft to demonstrate improvement in functional capacity for  transition to cardiac walking program    Baseline 03/12/21 6MWT = 1195; 1/7/20201: deferred; 12/07/2019 1095; 01/29/2020: deferred secondary to fatigue; 03/20/2020: 1078ft; 07/15/2020: 1160ft; 09/03/2020: 854ft; 11/05/2020: 940ft; 01/13/2021: 1057ft    Time 8    Period Weeks    Status On-going      PT LONG TERM GOAL #7   Title Patient will improve her 5xsts to under 15 sec to indicate singificnat improvement in LE functional strength and decrease in fall risk.    Baseline 24.5 sec; 03/20/2020: 19.19 sec; 06/13/2020: 14.92; 03/12/21: 12.91sec; 8/22: 21.89 sec    Time 6    Period Weeks    Status Achieved      PT LONG TERM GOAL #8   Title Patient will improve FGA to 28/30 to indicate improvement in dynamic balance and improvement in stability.    Baseline 23/30; 06/11/2020: 25/30; 07/15/2020: 26/30; 09/02/2020: 27/30; 11/05/2020: 28/30    Time 6    Period Weeks    Status Achieved      PT LONG TERM GOAL  #9   TITLE Patient will be able to ascend/desend the stairs without use of UEs to increase safety and decrease fall risk with stair use.    Baseline requires use of UEs; 01/13/2021: able to perform with CGA; 03/12/21: able to perform 4 steps/up and down hands free supervision level:  8/22: able to ascend without UE but descend with unilateral UE support and supervision    Time 6    Period Weeks    Status Partially Met      PT LONG TERM GOAL  #10   TITLE Patient will improve her TUG scores to under 12 sec for normal tug; 15 sec for cognitive TUG to decrease fall risk overall    Baseline 01/13/21: 17.2 Regular ; 27.3 sec  cognitive; 5/11 12.22sec regular (cognitive not assessed); 8/22: reg 15 sec    Time 6    Period Weeks    Status Not Met                   Plan - 06/23/21 1357     Clinical Impression Statement Pt demonstrated decreased balance, LE strength, and decreased activity tolerance this session. Pt is consistently regressing in physical performance and is appropriate for discharge to [facility] program for more extensive and specific physical therapy services. Pt limited at this time more by PKD symtpoms than true lumbar pain and weakness. Pt very approproiate for B&L and further management from neuro-PT for current limitations.  Pt expressed readiness for discharge and had no questions about further appointment time or location for transition of care. See goals for specific details. End PT POC for this episode.    Personal Factors and Comorbidities Age;Comorbidity 3+;Time since onset of injury/illness/exacerbation;Comorbidity 2;Other    Comorbidities MCI, HTN, CA, cataracts    Examination-Activity Limitations Bend;Sit;Squat;Stairs;Stand;Sleep    Examination-Participation Restrictions Cleaning;Community Activity    Stability/Clinical Decision Making Stable/Uncomplicated    Clinical Decision Making Moderate    Rehab Potential Fair    Clinical Impairments Affecting Rehab Potential MCI    PT Frequency 2x / week    PT Duration 6 weeks    PT Treatment/Interventions ADLs/Self Care Home Management;Therapeutic exercise;Patient/family education;Neuromuscular re-education;Therapeutic activities;Functional mobility training;Balance training;Manual techniques;Gait training;Stair training;Moist Heat;Electrical Stimulation;Cryotherapy;Taping;Dry needling;Energy conservation;Passive range of motion;Joint Manipulations    PT Next Visit [place] no updates this visit    Consulted and Agree with Plan of Care Patient             Patient will benefit from skilled therapeutic intervention in order to improve the following deficits and impairments:  Decreased range of motion, Postural dysfunction, Pain, Abnormal gait, Decreased activity tolerance, Decreased coordination, Decreased endurance, Decreased balance, Decreased mobility, Difficulty walking, Hypomobility, Increased muscle spasms, Impaired perceived functional ability, Decreased strength, Impaired  flexibility  Visit Diagnosis: Difficulty in walking, not elsewhere classified  Muscle weakness (generalized)     Problem List Patient Active Problem List   Diagnosis Date Noted   Chronic bilateral low back pain 06/18/2021   Elevated blood pressure reading 05/23/2021   Osteopenia of neck of right femur 09/06/2020   Obesity 08/29/2020   Parkinson disease ([place]) 02/22/2020   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   Bipolar disorder, in full remission, most recent episode depressed ([place]) 11/08/2019   Avitaminosis D 08/24/2019   Insomnia due to mental condition 05/31/2019   Bipolar I disorder, most recent episode depressed ([place]) 05/31/2019   Alcohol use disorder, moderate, in sustained remission ([place]) 05/31/2019   Elevated tumor markers 05/27/2019   GAD (generalized anxiety disorder) 04/10/2019   MDD (major depressive disorder), severe ([place]) 01/31/2019   High serum carbohydrate antigen 19-9 (CA19-9) 12/01/2018   Adjustment disorder with anxious mood 11/24/2018   Essential hypertension 11/24/2018   B12 deficiency 10/17/2018   Incisional hernia, without obstruction or gangrene 09/12/2018   Genetic testing 04/07/2018   Malignant neoplasm of upper-outer quadrant of right breast in female, estrogen receptor positive ([place]) 03/17/2018   Malignant neoplasm of upper-inner quadrant of right breast in female, estrogen receptor positive ([place]) 03/17/2018   History of psychosis 02/07/2018   History of insomnia 02/07/2018    Durwin Reges DPT Sharion Settler, SPT  Durwin Reges 06/23/2021, 3:42 PM  [facility] [address] [place], [address] Phone: [phone]   Fax:  [phone]  Name: Zhoey Blackstock MRN: [id number] Date of Birth: Oct 31, 1946

## 2021-06-27 DIAGNOSIS — M48062 Spinal stenosis, lumbar region with neurogenic claudication: Secondary | ICD-10-CM | POA: Diagnosis not present

## 2021-06-27 DIAGNOSIS — M5416 Radiculopathy, lumbar region: Secondary | ICD-10-CM | POA: Diagnosis not present

## 2021-07-02 ENCOUNTER — Ambulatory Visit: Payer: Self-pay | Admitting: *Deleted

## 2021-07-02 ENCOUNTER — Telehealth: Payer: Self-pay

## 2021-07-02 NOTE — Telephone Encounter (Signed)
Copied from [place] [phone]. Topic: General - Other >> Jul 02, 2021  4:01 PM Leward Quan A wrote: Reason for CRM: Patient called in to inquire of Dr B if there is something that she can take that will help relieve her from constipation. States that she know her medications are causing the constipationand that the Colace and Mirilax is not helping her to go like she should. Asking for a call back at  Ph# [phone]

## 2021-07-02 NOTE — Telephone Encounter (Signed)
Patient called back to report issues with constipation have not resolved. LBM 3 days ago . Has tried colace and miralax with no BM. Patient reports she is taking pain medications but could not remember name of medications. Patient has difficulty walking and unable to exercise. Abdominal pain denies vomiting or fever.  Reports drinking plenty of water. Care advise given. Patient verbalized understanding of care advise and to call back or go to ED if symptoms worsen.

## 2021-07-02 NOTE — Telephone Encounter (Signed)
Reason for Disposition  Unable to have a bowel movement (BM) without laxative or enema  Answer Assessment - Initial Assessment Questions 1. STOOL PATTERN OR FREQUENCY: "How often do you have a bowel movement (BM)?"  (Normal range: 3 times a day to every 3 days)  "When was your last BM?"       LBM 3 days ago  2. STRAINING: "Do you have to strain to have a BM?"      Yes  3. RECTAL PAIN: "Does your rectum hurt when the stool comes out?" If Yes, ask: "Do you have hemorrhoids? How bad is the pain?"  (Scale 1-10; or mild, moderate, severe)     Unsure  4. STOOL COMPOSITION: "Are the stools hard?"      No  5. BLOOD ON STOOLS: "Has there been any blood on the toilet tissue or on the surface of the BM?" If Yes, ask: "When was the last time?"      No  6. CHRONIC CONSTIPATION: "Is this a new problem for you?"  If no, ask: "How long have you had this problem?" (days, weeks, months)      No  7. CHANGES IN DIET OR HYDRATION: "Have there been any recent changes in your diet?" "How much fluids are you drinking on a daily basis?"  "How much have you had to drink today?"     No  8. MEDICATIONS: "Have you been taking any new medications?" "Are you taking any narcotic pain medications?" (e.g., Vicodin, Percocet, morphine, Dilaudid)     Pain medications but could not name medications  9. LAXATIVES: "Have you been using any stool softeners, laxatives, or enemas?"  If yes, ask "What, how often, and when was the last time?"     Colace and miralax  10. ACTIVITY:  "How much walking do you do every day?"  "Has your activity level decreased in the past week?"        No . Pain is too bad can not walk due to pain  11. CAUSE: "What do you think is causing the constipation?"        Pain medication  12. OTHER SYMPTOMS: "Do you have any other symptoms?" (e.g., abdominal pain, bloating, fever, vomiting)       Abdominal pain and firm to touch  13. MEDICAL HISTORY: "Do you have a history of hemorrhoids, rectal fissures, or  rectal surgery or rectal abscess?"         No  14. PREGNANCY: "Is there any chance you are pregnant?" "When was your last menstrual period?"       na  Protocols used: Constipation-A-AH

## 2021-07-03 NOTE — Telephone Encounter (Signed)
Patient advised as below.  

## 2021-07-03 NOTE — Telephone Encounter (Signed)
I can't tell that advice was given. See my other phone note. Sounds like she needs evaluation

## 2021-07-03 NOTE — Telephone Encounter (Signed)
Can offer appt to discuss Rx meds, but in the meantime, increase miralax dose and titrate to 1 soft BM daily

## 2021-07-08 ENCOUNTER — Encounter: Payer: Medicare PPO | Admitting: Physical Therapy

## 2021-07-09 ENCOUNTER — Other Ambulatory Visit: Payer: Self-pay

## 2021-07-09 ENCOUNTER — Ambulatory Visit: Payer: Medicare PPO | Attending: Family Medicine

## 2021-07-09 ENCOUNTER — Ambulatory Visit: Payer: Medicare PPO | Admitting: Speech Pathology

## 2021-07-09 DIAGNOSIS — M79605 Pain in left leg: Secondary | ICD-10-CM | POA: Insufficient documentation

## 2021-07-09 DIAGNOSIS — R278 Other lack of coordination: Secondary | ICD-10-CM | POA: Diagnosis not present

## 2021-07-09 DIAGNOSIS — M79604 Pain in right leg: Secondary | ICD-10-CM | POA: Diagnosis not present

## 2021-07-09 DIAGNOSIS — R2681 Unsteadiness on feet: Secondary | ICD-10-CM | POA: Insufficient documentation

## 2021-07-09 DIAGNOSIS — R2689 Other abnormalities of gait and mobility: Secondary | ICD-10-CM | POA: Diagnosis not present

## 2021-07-09 DIAGNOSIS — G20 Parkinson's disease: Secondary | ICD-10-CM | POA: Insufficient documentation

## 2021-07-09 DIAGNOSIS — G20A1 Parkinson's disease without dyskinesia, without mention of fluctuations: Secondary | ICD-10-CM

## 2021-07-09 DIAGNOSIS — R471 Dysarthria and anarthria: Secondary | ICD-10-CM | POA: Insufficient documentation

## 2021-07-09 DIAGNOSIS — M6281 Muscle weakness (generalized): Secondary | ICD-10-CM | POA: Insufficient documentation

## 2021-07-09 NOTE — Therapy (Signed)
[facility] [address] [place], [place], [address] Phone: [phone]   Fax:  [phone]  Physical Therapy Evaluation  Patient Details  Name: Kendra [name] MRN: [id number] Date of Birth: October 14, 1946 Referring Provider (PT): Arbutus Ped., MD  Encounter Date: 07/09/2021   PT End of Session - 07/10/21 0945     Visit Number 1    Number of Visits 17    Date for PT Re-Evaluation 08/13/21    Authorization Type LSVT BIG    Authorization Time Period Evaluation performed on 07/09/2021    Authorization - Visit Number --    Authorization - Number of Visits --    PT Start Time 0403    PT Stop Time 0504    PT Time Calculation (min) 61 min    Equipment Utilized During Treatment Gait belt    Activity Tolerance Patient tolerated treatment well;No increased pain    Behavior During Therapy [facility] for tasks assessed/performed             Past Medical History:  Diagnosis Date   Anxiety    Breast cancer ([place]) 03/2018   right breast cancer at 11:00 and 1:00   Bursitis    bilateral hips and knees   Complication of anesthesia    Depression    Family history of breast cancer 03/24/2018   Family history of lung cancer 03/24/2018   History of alcohol dependence ([place]) 2007   no ETOH; resolved since 2007   History of hiatal hernia    History of psychosis 2014   due to sleep disturbance   Hypertension    Parkinson disease ([place])    Personal history of radiation therapy 06/2018-07/2018   right breast ca   PONV (postoperative nausea and vomiting)     Past Surgical History:  Procedure Laterality Date   BREAST BIOPSY Right 03/14/2018   11:00 DCIS and invasive ductal carcinoma   BREAST BIOPSY Right 03/14/2018   1:00 Invasive ductal carcinoma   BREAST LUMPECTOMY Right 04/27/2018   lumpectomy of 11 and 1:00 cancers, clear margins, negative LN   BREAST LUMPECTOMY WITH RADIOACTIVE SEED AND SENTINEL LYMPH NODE BIOPSY Right 04/27/2018    Procedure: RIGHT BREAST LUMPECTOMY WITH RADIOACTIVE SEED X 2 AND RIGHT SENTINEL LYMPH NODE BIOPSY;  Surgeon: [name], MD;  Location: [facility];  Service: General;  Laterality: Right;   CATARACT EXTRACTION W/PHACO Left 08/30/2019   Procedure: CATARACT EXTRACTION PHACO AND INTRAOCULAR LENS PLACEMENT ([place]) LEFT panoptix toric  01:20.5  12.7%  10.26;  Surgeon: Leandrew Koyanagi, MD;  Location: [facility];  Service: Ophthalmology;  Laterality: Left;   CATARACT EXTRACTION W/PHACO Right 09/20/2019   Procedure: CATARACT EXTRACTION PHACO AND INTRAOCULAR LENS PLACEMENT ([facility]) RIGHT 5.71  00:36.4  15.7%;  Surgeon: Leandrew Koyanagi, MD;  Location: [facility];  Service: Ophthalmology;  Laterality: Right;   LAPAROTOMY N/A 02/10/2018   Procedure: EXPLORATORY LAPAROTOMY;  Surgeon: Isabel Caprice, MD;  Location: [facility];  Service: Gynecology;  Laterality: N/A;   MASS EXCISION  01/2018   abdominal   OVARIAN CYST REMOVAL     SALPINGOOPHORECTOMY Bilateral 02/10/2018   Procedure: BILATERAL SALPINGO OOPHORECTOMY; PERITONEAL WASHINGS;  Surgeon: Isabel Caprice, MD;  Location: [facility];  Service: Gynecology;  Laterality: Bilateral;   TONSILLECTOMY     TONSILLECTOMY AND ADENOIDECTOMY  1953    There were no vitals filed for this visit.    Subjective Assessment - 07/09/21 1615  Subjective Patient presents to LSVT BIG evaluation with her spouse.  Patient ambulating without assistive device. Patient reports she received official diagnosis of Parkinson's disease in 2019.  She is unsure of when symptoms first started.  Spouse does report first noticing pt having gait difficulty that resulted in fall in their yard and pt breaking arm.  Patient describes gait changes as trouble stopping, decreased gait speed and poor balance.  Patient has since developed impaired cognition.  Patient now has trouble with majority of ADLs. She reports difficulty with writing, following  recipes and instructions, difficulty with using her hands to open jars, and difficulty with ascending/descending stairs and with transfers.  She does report episodes of freezing.  While pt reports no falls in the last 6 months she expresses fear of falls and that her walking balance is poor.  Patient is not in any pain at rest but has had physical therapy before to address lower extremity and hip pain. Pain can reach 7/10 with activity.    Patient is accompained by: Family member   spouse, [name]   Pertinent History Pt presents to PT for LSVT BIG evaluation. Pt diagnosed with PD in 2019. Pt reports difficulty with gait speed, freezing, transfers, balance, cognition, and use of hands d/t tremors. Pt has previously attended PT for treatment of back pain. Per chart other PMH includes hx of breast cancer, HTN, MCI, insomnia, cataracts, bipolar 1, alcohol use disorder, GAD, MDD, osteopenia of neck of R femur, chronic B low back pain with radiating pain to buttock and posterior thighs and calves    Limitations Sitting;Standing;Walking;Reading;Lifting;Writing;House hold activities    How long can you sit comfortably? limited if not well positioned. Feet must be propped up.    How long can you stand comfortably? not comfortable, pain is constant    How long can you walk comfortably? 5 min    Diagnostic tests No recent imaging pertinent to PD per chart. Other most recent imaging per Chancis, Juanda Crumble note 06/27/2021: 'MRI lumbar spine without contrast from [facility] dated 01/31/2020 imaging and report reviewed today. L5-S1 central disc bulging with severe bilateral facet arthropathy no central or foraminal stenosis. L4-5 grade 1 anterolisthesis with severe bilateral facet arthropathy and ligamentum flavum hypertrophy. She has severe right and moderate to severe left foraminal stenosis. Moderate central stenosis. L3-4 broad-based disc bulging with severe bilateral facet arthropathy and ligamentum flavum hypertrophy.  Severe central stenosis. Moderate severe subarticular stenosis. No foraminal stenosis. L2-3 diffuse disc bulging with severe bilateral facet arthropathy and ligamentum flavum hypertrophy. Severe central stenosis and severe bilateral subarticular recess stenosis. Moderate left and mild to moderate right foraminal stenosis. L1-2 broad-based disc bulging with mild bilateral facet arthropathy. No central or foraminal stenosis."    Patient Stated Goals Pt would like to be able to walk faster, get up from a chair more easily    Currently in Pain? Yes    Pain Score 7     Pain Location Hip    Pain Descriptors / Indicators Aching    Pain Type Chronic pain    Pain Onset More than a month ago    Multiple Pain Sites Yes    Pain Score 7    Pain Location Leg    Pain Orientation Left;Right    Pain Descriptors / Indicators Aching    Pain Onset More than a month ago    Pain Relieving Factors propping up legs with sitting            LSVT  BIG EVALUATION & EXAMINATION   Neurological and Other Medical Information:   What were your initial symptoms of Parkinson disease? Pt is unsure. Pt's spouse present and reports pt first diagnosed in 2019.    Do you have tremors?  Yes__x _ No____  Comments: Pt reports writing increases tremors, primarily right hand.   Do you have any pain? Yes x____ No __ ___ If yes, please describe:  Pt on pain medication for hip and leg pain. She reports discomfort in her feet and ankle swelling. She says she aches most of the time.   Pain rating (on scale of 1-10 with 10 being most severe): Pt reports general pain is 7/10. She says she doesn't hurt unless she moves.  No pain currently at rest.   Medical Information:    Medication for Parkinson disease: Pt taking SINEMET 3x daily. Pt's spouse reports pt is taking pain medications d/t Parkinson's.    In what ways are your medication(s) for Parkinson's helpful? Pt is unsure. Pt's spouse reports tremors have significantly decreased  since starting medication.    Do you experience on/off symptoms? Yes __x__ No_____ If yes, please describe: Pt reports she doesn't feel she is moving very well. Pt spouse says sx are unpredictable and vary day to day.  Patient cannot identify a consistent time where she feels better or worse.    Motor Symptoms:    When did you first start to notice changes in your movement you associate with Parkinson disease?  2019   What are your current symptoms?  Pt reports tremors.  Patient reports difficulty with STS, ascending/descending stairs, impaired cognition, difficulty/slow walking, difficulty with writing and bending. Pt no longer wears sneakers because of difficulty tying shoes. Pt on longer cooks because impaired cognition affects ability to follow recipes. Pt reports some dizziness with bending. Overall difficulty with ADLs.  What is your most significant problem related to movement today? transfers   Has Parkinson disease caused you to move less or be less active? Yes __ x__ No____ If yes, how much less? Pt reports acitivty level is fair or less than fair   Have you noticed if your movement is slower than it used to be? For example, walking, getting dressed, doing household chores, bathing, etc.  Yes _x__ No____ If yes please describe: Pt reports it takes longer to get ready. Pt reports difficulty putting in her hearing aides.    Have you or others noticed any changes in your posture? Yes __x__ No____ If yes, please describe: Patient and her spouse report crouched posture  How many (if any) falls have you had in the last six months?  None  What factors contributed to those falls?  Prior to 2019 pt spouse reports noticing pt had abnormal gait, pt sometimes couldn't stop while ambulating and one time fell in yard and broke her arm.  Patient reports difficulty stopping walking.   Have you noticed changes in your stamina? Yes __x_ No_____  If yes, please describe: Pt says stamina "doesn't  last."   Have you noticed any freezing with your movement? Yes __x__ No____ If yes, when do you notice freezing?  Comments: changing positions, bending over, with walking   Are there some activities you now need help with because of your Parkinson disease? For example, getting socks or shoes on, buttoning, getting up from low chairs, walking on uneven ground, etc. Difficulty walking on uneven ground, getting out of chairs, difficulty with putting on tennis shoes   Have you  noticed any changes in the functioning of your hands? Yes x____ No____ ; If yes please describe: tremors with writing  If you have problems with your hand function today, what is/are the most significant problem (s)? Have you noticed any changes in your ability to: -Button: -Dial the phone:  -Open containers: More difficult, mostly unable to open containers -Manipulate money: -Tie shoes: Unable to -Write: Difficulty with writing due to tremors -Type or use a computer: -Other:    Have you noticed if your hands feel any weaker than they used to? Yes ___x__ No____ If yes, please describe: Doesn't try to open certain things anymore, such as jars     Movement Situations:    If you had one situation in which you wanted to move well, what would it be? To skip, patient states, " I think that would be fun."   Describe your day in terms of mobility or movement activity/situations (who is present, how do you move, when does it occur, with what devices). When do you find it most difficult to move?  Unpredictable   Why is it difficult to move in these situations/times that you mentioned?  Slow movement, tired, pain, cognition (has to think about what needs to go first/sequencing)     Assessment:      MMT: gross B LE and B UE strength is  LE 4+/5 B UE 5/5 B except for grip strength is 4+/5 B      Coordination/Cerebellar Finger to Nose: WFL Heel to Shin: WFL Rapid alternating movements: hypokinetic but accurate    Sensation WFL      Sit to stand assessment:  5x STS: 18.5 seconds, painful in hips, legs and ankles, rates pain as 6/10.  Score indicates increased fall risk.   Gait assessments:  10 MWT:  0.74 m/s, mild unsteadiness, close CGA. Pt reports she is not aware of it.  Reduced compared to age normative data.  Patient exhibits decreased step length bilaterally and rigidity of movement overall, bradykinetic.   Balance Assessments: TUG: 17 seconds, some difficulty with the turn (unsteady).  Score indicates patient is a fall risk.    FOTO: 75 (goal 32), patient's spouse must fill out on patient's behalf due to patient's cognition   Functional Component movements:   Sit to stand (required) Walk and turn Bend and reach Open Jar  Step up on step  Hierarchies:  Going for a walk outside (uneven, varied surfaces) with her spouse 2.  Handwriting (signing name/signature to fill out forms) 3. Ascending/descending stairs    Education: PT provides education on length, intensity, frequency of LSVT BIG program, frequency of HEP with program. Pt verbalizes understanding.    Assessment: Patient is a pleasant 75 year old female presenting to PT evaluation for LSVT BIG program following progression of Parkinson's disease sx (pt diagnosed in 2019).  Examination reveals decreased bilateral lower extremity strength and decreased bilateral grip strength, increased fall risk as evidenced by TUG and 5xSTS performance.  Patient also exhibits impairments of gait, including decreased gait speed, balance, and impaired gait mechanics.  Patient demonstrates rigidity throughout exam along with hypokinetic and bradykinetic movements (most obvious with gait).  Patient with impaired cognition and required increased time/assistance to follow cueing and with comprehension of questionnaires. The pt is appropriate for treatment with LSVT BIG Protocol, which is an evidenced-based treatment shown to improve gait speed, step-length,  balance and function with ADLs in people with PD. LSVT BIG is delivered 4 times per week for 4 weeks by  a certified LSVT BIG therapist. LSVT BIG focuses on increasing amplitude of movements so that the patient can effectively override bradykinesia and hypokinesia symptoms of PD. The patient is in agreement with goals and plan of care and will benefit from further skilled PT to improve the aforementioned deficits.   Objective measurements completed on examination: See above findings.    Note: Portions of this document were prepared using Dragon voice recognition software and although reviewed may contain unintentional dictation errors in syntax, grammar, or spelling.     PT Short Term Goals - 07/10/21 1323       PT SHORT TERM GOAL #1   Title Pt will demonstrate independence with LSVT BIG HEP to improve strength/mobility for improved safety and ease with ADLs and to maintain/continue gains beyond LSVT program.    Baseline 9/7: to be initiated next session    Time 2    Period Weeks    Status New    Target Date 07/23/21               PT Long Term Goals - 07/10/21 1325       PT LONG TERM GOAL #1   Title Patient will increase FOTO score to equal to or greater than 47  to demonstrate statistically significant improvement in mobility and quality of life.    Baseline 9/7: 34    Time 5    Period Weeks    Status New    Target Date 08/13/21      PT LONG TERM GOAL #2   Title Patient (> 44 years old) will complete five times sit to stand test in < 15 seconds indicating decreased fall risk.    Baseline 9/7: 18.5 sec    Time 5    Period Weeks    Status New    Target Date 08/13/21      PT LONG TERM GOAL #3   Title Patient will increase 10 meter walk test to >1.58ms with WFL B step-length for 100% of test to improve ease with gait and to override hypokentic and bradykinetic movement.    Baseline 9/7: 0.74 m/s, decreased B step length and rigidty of movement    Time 5    Period Weeks     Status New    Target Date 08/13/21      PT LONG TERM GOAL #4   Title Patient will reduce timed up and go to <11 seconds to reduce fall risk and demonstrate improved transfer/gait ability and ability to perform turns safely    Baseline 9/7: 17 seconds    Time 5    Period Weeks    Status New    Target Date 08/13/21      PT LONG TERM GOAL #5   Title Pt will demo or report ability to open standard jar using only 1-2 attempts in order to exhibit improved functional mobility for ADLs.    Baseline 9/7: Pt reports she is unable to open jars    Time 5    Period Weeks    Status New    Target Date 08/13/21                    Plan - 07/09/21 1642     Clinical Impression Statement Patient is a pleasant 75year old female presenting to PT evaluation for LSVT BIG program following progression of Parkinson's disease sx (pt diagnosed in 2019).  Examination reveals decreased bilateral lower extremity strength and decreased bilateral grip strength, increased fall  risk as evidenced by TUG and 5xSTS performance.  Patient also exhibits impairments of gait, including decreased gait speed, balance, and impaired gait mechanics.  Patient demonstrates rigidity throughout exam along with hypokinetic and bradykinetic movements (most obvious with gait).  Patient with impaired cognition and required increased time/assistance to follow cueing and with comprehension of questionnaires. The pt is appropriate for treatment with LSVT BIG Protocol, which is an evidenced-based treatment shown to improve gait speed, step-length, balance and function with ADLs in people with PD. LSVT BIG is delivered 4 times per week for 4 weeks by a certified LSVT BIG therapist. LSVT BIG focuses on increasing amplitude of movements so that the patient can effectively override bradykinesia and hypokinesia symptoms of PD. The patient is in agreement with goals and plan of care and will benefit from further skilled PT to improve the  aforementioned deficits.    Personal Factors and Comorbidities Age;Sex;Fitness;Time since onset of injury/illness/exacerbation;Comorbidity 1;Comorbidity 3+;Comorbidity 2    Comorbidities MCI, HTN, CA, cataracts, bipolar 1, GAD, insomnia, chronic LBP    Examination-Activity Limitations Bend;Sit;Squat;Stairs;Stand;Locomotion Level;Self Feeding;Bathing;Transfers;Lift;Bed Mobility    Examination-Participation Restrictions Cleaning;Community Activity;Shop;Yard Work;Meal Prep;Personal Finances;Driving    Stability/Clinical Decision Making Evolving/Moderate complexity    Clinical Decision Making High    Rehab Potential Fair    Clinical Impairments Affecting Rehab Potential MCI, chronic back pain    PT Frequency 4x / week    PT Duration Other (comment)   5 weeks   PT Treatment/Interventions ADLs/Self Care Home Management;Therapeutic exercise;Patient/family education;Neuromuscular re-education;Therapeutic activities;Functional mobility training;Balance training;Gait training;Stair training;Taping;Energy conservation;Passive range of motion;DME Instruction;Cognitive remediation    PT Next Visit Plan Initiate LSVT BIG Maximal daily exercises, functional component tasks, hierarchy tasks, carryover tasks    PT Home Exercise Plan To provide packet of LSVT BIG exercises    Consulted and Agree with Plan of Care Patient;Family member/caregiver    Family Member Consulted spouse             Patient will benefit from skilled therapeutic intervention in order to improve the following deficits and impairments:  Abnormal gait, Decreased activity tolerance, Decreased cognition, Decreased endurance, Decreased knowledge of use of DME, Decreased range of motion, Decreased strength, Hypomobility, Dizziness, Pain, Improper body mechanics, Impaired UE functional use, Decreased balance, Decreased coordination, Decreased mobility, Difficulty walking, Increased edema, Impaired flexibility, Increased muscle spasms, Postural  dysfunction  Visit Diagnosis: Parkinson disease ([place])  Other abnormalities of gait and mobility  Unsteadiness on feet  Other lack of coordination  Muscle weakness (generalized)     Problem List Patient Active Problem List   Diagnosis Date Noted   Chronic bilateral low back pain 06/18/2021   Elevated blood pressure reading 05/23/2021   Osteopenia of neck of right femur 09/06/2020   Obesity 08/29/2020   Parkinson disease ([place]) 02/22/2020   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   Bipolar disorder, in full remission, most recent episode depressed ([place]) 11/08/2019   Avitaminosis D 08/24/2019   Insomnia due to mental condition 05/31/2019   Bipolar I disorder, most recent episode depressed ([place]) 05/31/2019   Alcohol use disorder, moderate, in sustained remission ([place]) 05/31/2019   Elevated tumor markers 05/27/2019   GAD (generalized anxiety disorder) 04/10/2019   MDD (major depressive disorder), severe ([place]) 01/31/2019   High serum carbohydrate antigen 19-9 (CA19-9) 12/01/2018   Adjustment disorder with anxious mood 11/24/2018   Essential hypertension 11/24/2018   B12 deficiency 10/17/2018   Incisional hernia, without obstruction or gangrene 09/12/2018   Genetic testing 04/07/2018   Malignant neoplasm of upper-outer  quadrant of right breast in female, estrogen receptor positive ([place]) 03/17/2018   Malignant neoplasm of upper-inner quadrant of right breast in female, estrogen receptor positive ([place]) 03/17/2018   History of psychosis 02/07/2018   History of insomnia 02/07/2018    Zollie Pee, PT 07/10/2021, 3:48 PM  [place] MAIN [facility] SERVICES [address] [place], [place], [address] Phone: [phone]   Fax:  [phone]  Name: Kendra [name] MRN: [id number] Date of Birth: [birth year]

## 2021-07-10 NOTE — Therapy (Signed)
[place] [facility] SERVICES [address] [place], [place], [address] Phone: [phone]   Fax:  [phone]  Speech Language Pathology Evaluation  Patient Details  Name: Kendra [name] MRN: [id number] Date of Birth: 1946/05/19 Referring Provider (SLP): Jerrol Banana., MD   Encounter Date: 07/09/2021   End of Session - 07/10/21 1927     Visit Number 1    Number of Visits 17    Date for SLP Re-Evaluation 10/07/21    SLP Start Time 1500    SLP Stop Time  1600    SLP Time Calculation (min) 60 min    Activity Tolerance Patient tolerated treatment well             Past Medical History:  Diagnosis Date   Anxiety    Breast cancer ([place]) 03/2018   right breast cancer at 11:00 and 1:00   Bursitis    bilateral hips and knees   Complication of anesthesia    Depression    Family history of breast cancer 03/24/2018   Family history of lung cancer 03/24/2018   History of alcohol dependence ([place]) 2007   no ETOH; resolved since 2007   History of hiatal hernia    History of psychosis 2014   due to sleep disturbance   Hypertension    Parkinson disease ([place])    Personal history of radiation therapy 06/2018-07/2018   right breast ca   PONV (postoperative nausea and vomiting)     Past Surgical History:  Procedure Laterality Date   BREAST BIOPSY Right 03/14/2018   11:00 DCIS and invasive ductal carcinoma   BREAST BIOPSY Right 03/14/2018   1:00 Invasive ductal carcinoma   BREAST LUMPECTOMY Right 04/27/2018   lumpectomy of 11 and 1:00 cancers, clear margins, negative LN   BREAST LUMPECTOMY WITH RADIOACTIVE SEED AND SENTINEL LYMPH NODE BIOPSY Right 04/27/2018   Procedure: RIGHT BREAST LUMPECTOMY WITH RADIOACTIVE SEED X 2 AND RIGHT SENTINEL LYMPH NODE BIOPSY;  Surgeon: [name], MD;  Location: [facility];  Service: General;  Laterality: Right;   CATARACT EXTRACTION W/PHACO Left 08/30/2019   Procedure: CATARACT  EXTRACTION PHACO AND INTRAOCULAR LENS PLACEMENT ([place]) LEFT panoptix toric  01:20.5  12.7%  10.26;  Surgeon: Leandrew Koyanagi, MD;  Location: [facility];  Service: Ophthalmology;  Laterality: Left;   CATARACT EXTRACTION W/PHACO Right 09/20/2019   Procedure: CATARACT EXTRACTION PHACO AND INTRAOCULAR LENS PLACEMENT ([facility]) RIGHT 5.71  00:36.4  15.7%;  Surgeon: Leandrew Koyanagi, MD;  Location: [facility];  Service: Ophthalmology;  Laterality: Right;   LAPAROTOMY N/A 02/10/2018   Procedure: EXPLORATORY LAPAROTOMY;  Surgeon: Isabel Caprice, MD;  Location: [facility];  Service: Gynecology;  Laterality: N/A;   MASS EXCISION  01/2018   abdominal   OVARIAN CYST REMOVAL     SALPINGOOPHORECTOMY Bilateral 02/10/2018   Procedure: BILATERAL SALPINGO OOPHORECTOMY; PERITONEAL WASHINGS;  Surgeon: Isabel Caprice, MD;  Location: [facility];  Service: Gynecology;  Laterality: Bilateral;   TONSILLECTOMY     TONSILLECTOMY AND ADENOIDECTOMY  1953    There were no vitals filed for this visit.   Subjective Assessment - 07/10/21 1915     Subjective "Sometimes people ask me to repeat myself."    Patient is accompained by: Family member   [name], spouse   Currently in Pain? Yes    Pain Score 7     Pain Location Hip  SLP Evaluation [facility] - 07/10/21 1915       SLP Visit Information   SLP Received On 07/09/21    Referring Provider (SLP) Jerrol Banana., MD    Onset Date --   memory changes for several years, speech changes in last couple of years, PD dx 2019   Medical Diagnosis Parkinson's diesase      General Information   HPI Patient is a 75 y.o. female with history of Parkinson's disease, anxiety, depression, psychosis, HTN. bipolar disorder, alcoholism, memory issues since 2005, hx head trauma. Referred for LSVT BIG and LOUD.    Behavioral/Cognition alert, cooperative, confused      Balance Screen   Has the patient fallen in the past 6 months --   PT eval today      Prior Functional Status   Cognitive/Linguistic Baseline Baseline deficits    Baseline deficit details memory deficits since 2005; hx head trauma     Lives With Spouse    Available Support Family      Cognition   Overall Cognitive Status History of cognitive impairments - at baseline    Attention Sustained;Selective    Sustained Attention Impaired    Sustained Attention Impairment Verbal basic   topic maintenance   Selective Attention Impaired    Selective Attention Impairment Verbal basic   auditory distractions   Memory Impaired      Auditory Comprehension   Overall Auditory Comprehension Appears within functional limits for tasks assessed      Visual Recognition/Discrimination   Discrimination Not tested      Reading Comprehension   Reading Status Not tested   able to read paragraph level materials with some errors     Expression   Primary Mode of Expression Verbal      Verbal Expression   Overall Verbal Expression Impaired at baseline   history of wordfinding difficulty     Written Expression   Dominant Hand Right    Written Expression Not tested      Oral Motor/Sensory Function   Overall Oral Motor/Sensory Function Impaired    Labial ROM Reduced right;Reduced left    Labial Symmetry Within Functional Limits    Labial Strength Within Functional Limits    Labial Sensation Within Functional Limits    Labial Coordination WFL    Lingual ROM Within Functional Limits    Lingual Symmetry Within Functional Limits    Lingual Strength Within Functional Limits    Lingual Sensation Within Functional Limits    Facial ROM Reduced right;Reduced left    Facial Symmetry Within Functional Limits    Facial Sensation Within Functional Limits    Velum Within Functional Limits    Mandible Within Functional Limits      Motor Speech   Overall Motor Speech Impaired    Respiration Impaired    Level of Impairment Conversation    Phonation Low vocal intensity;Hoarse    Resonance  Within functional limits    Articulation Impaired    Level of Impairment Conversation    Intelligibility Intelligible   in a quiet environment to trained listener   Motor Planning Witnin functional limits    Interfering Components Hearing loss    Effective Techniques Increased vocal intensity      Standardized Assessments   Standardized Assessments  Other Assessment   See LSVT assessment below             LSVT-LOUD Voice Evaluation Maximum phonation time for sustained "ah": 16.5 seconds Mean intensity during sustained "ah":  74 dB  Mean fundamental frequency during sustained "ah": 173 Hz (below average of 244 Hz+/-27 for age and gender) Mean intensity sustained during conversational speech: 73 dB Habitual pitch: 144 Hz Highest dynamic pitch when altering pitch from a low note to a high note: 281 Hz Lowest dynamic pitch when altering from a high note to a low note: 135 Hz Highest dynamic pitch during conversational speech: 178 Hz Lowest dynamic pitch during conversational speech: 111 Hz   Speech is characterized by hypoarticulation, variable rate, hoarse vocal quality. For trained listener in quiet environment with context, intelligibility was 100%. Patient reports requests to repeat herself in noisier environments.   Patient able to improve all parameters with model ("Loud like me") Stimulability: Improved vocal quality with loud voice (83 dB) for sustained vowel, maximum high and low phonation improved to range 186-358 Hz, and functional phrases averaged 78 dB.  Improved pitch range with model.     The Communication Effectiveness Survey is a patient-reported outcome measure in which the patient rates their own effectiveness in different communication situations. A higher score indicates greater effectiveness. Pt's self-rating was 19/32.                SLP Education - 07/10/21 1927     Education Details Course of therapy, LSVT protocol    Person(s) Educated  Patient;Spouse    Methods Explanation    Comprehension Verbalized understanding              SLP Short Term Goals - 07/10/21 1936       SLP SHORT TERM GOAL #1   Title The patient will complete Daily Tasks (Maximum duration "ah", High/Lows, and Functional Phrases) at average loudness >/= 85 dB and with loud, good quality voice with min cues.    Time 10    Period --   sessions   Status New      SLP SHORT TERM GOAL #2   Title The patient will complete Hierarchal Speech Loudness reading drills (words/phrases, sentences) at average >/= 75 dB and with loud, good quality voice with min cues.    Time 10    Period --   sessions   Status New      SLP SHORT TERM GOAL #3   Title The patient will participate in 5-8 minutes conversation, maintaining average loudness of 75 dB and loud, good quality voice with min cues.    Time 10    Period --   sessions   Status New              SLP Long Term Goals - 07/10/21 1937       SLP LONG TERM GOAL #1   Title The patient will complete Daily Tasks (Maximum duration "ah", High/Lows, and Functional Phrases) at average loudness >/= 85 dB and with loud, good quality voice    Time 4    Period Weeks   or 17 sessions, for all LTGs   Status New    Target Date 08/10/21      SLP LONG TERM GOAL #2   Title The patient will complete Hierarchal Speech Loudness reading drills (words/phrases, sentences, and paragraph) at average >/= 75 dB and with loud, good quality voice.    Time 4    Period Weeks    Status New    Target Date 08/10/21      SLP LONG TERM GOAL #3   Title The patient will participate in 15-20 minutes conversation, maintaining average loudness of >/= 75  dB and loud, good quality voice.    Time 4    Period Weeks    Status New    Target Date 08/10/21      SLP LONG TERM GOAL #4   Title Patient will report improved communication effectiveness as measured by Communicative Effectiveness Survey.    Baseline 19/32 at eval    Time 4     Period Weeks    Status New    Target Date 08/10/21              Plan - 07/10/21 1928     Clinical Impression Statement Patient presents with mild hypokinetic dysarthria characterized by reduced vocal intensity, short rushes of speech/variable rate, and hoarse vocal quality. Patient also has cognitive impairment, per chart review progressive since 2005. Per neurology notes, cause is likely multifactorial given hx which includes head trauma, bipolar disorder, and alchoholism. Patient reports frustration with wordfinding difficulties, and reduced communicative effectiveness. Patient was stimulable for improved loudness and vocal quality using intensity-based cues and clinician model. I recommend skilled ST for LSVT LOUD 4x a week for 4 weeks in order to improve vocal quality, intelligibility, and communicative effectiveness.    Speech Therapy Frequency 4x / week    Duration 4 weeks    Treatment/Interventions Language facilitation;Environmental controls;Cueing hierarchy;SLP instruction and feedback;Compensatory techniques;Functional tasks;Patient/family education;Multimodal communcation approach   LSVT LOUD   Potential to Achieve Goals Good    Potential Considerations Ability to learn/carryover information    SLP Home Exercise Plan to be provided next session    Consulted and Agree with Plan of Care Patient;Family member/caregiver    Family Member Consulted spouse             Patient will benefit from skilled therapeutic intervention in order to improve the following deficits and impairments:   Dysarthria and anarthria  Parkinson disease ([place])    Problem List Patient Active Problem List   Diagnosis Date Noted   Chronic bilateral low back pain 06/18/2021   Elevated blood pressure reading 05/23/2021   Osteopenia of neck of right femur 09/06/2020   Obesity 08/29/2020   Parkinson disease ([place]) 02/22/2020   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   Bipolar disorder, in full remission, most  recent episode depressed ([place]) 11/08/2019   Avitaminosis D 08/24/2019   Insomnia due to mental condition 05/31/2019   Bipolar I disorder, most recent episode depressed ([place]) 05/31/2019   Alcohol use disorder, moderate, in sustained remission ([place]) 05/31/2019   Elevated tumor markers 05/27/2019   GAD (generalized anxiety disorder) 04/10/2019   MDD (major depressive disorder), severe ([place]) 01/31/2019   High serum carbohydrate antigen 19-9 (CA19-9) 12/01/2018   Adjustment disorder with anxious mood 11/24/2018   Essential hypertension 11/24/2018   B12 deficiency 10/17/2018   Incisional hernia, without obstruction or gangrene 09/12/2018   Genetic testing 04/07/2018   Malignant neoplasm of upper-outer quadrant of right breast in female, estrogen receptor positive ([place]) 03/17/2018   Malignant neoplasm of upper-inner quadrant of right breast in female, estrogen receptor positive ([place]) 03/17/2018   History of psychosis 02/07/2018   History of insomnia 02/07/2018   Deneise Lever, [place], CCC-SLP Speech-Language Pathologist  Aliene Altes 07/10/2021, 7:40 PM  [facility] [address] [place], [place], [address] Phone: [phone]   Fax:  [phone]  Name: Kendra [name] MRN: [id number] Date of Birth: 10-25-1946

## 2021-07-14 ENCOUNTER — Ambulatory Visit: Payer: Medicare PPO | Admitting: Family Medicine

## 2021-07-14 ENCOUNTER — Ambulatory Visit: Payer: Medicare PPO

## 2021-07-14 ENCOUNTER — Ambulatory Visit: Payer: Medicare PPO | Admitting: Speech Pathology

## 2021-07-14 ENCOUNTER — Other Ambulatory Visit: Payer: Self-pay

## 2021-07-14 ENCOUNTER — Telehealth: Payer: Self-pay

## 2021-07-14 DIAGNOSIS — R2689 Other abnormalities of gait and mobility: Secondary | ICD-10-CM | POA: Diagnosis not present

## 2021-07-14 DIAGNOSIS — M79605 Pain in left leg: Secondary | ICD-10-CM | POA: Diagnosis not present

## 2021-07-14 DIAGNOSIS — R471 Dysarthria and anarthria: Secondary | ICD-10-CM | POA: Diagnosis not present

## 2021-07-14 DIAGNOSIS — M79604 Pain in right leg: Secondary | ICD-10-CM | POA: Diagnosis not present

## 2021-07-14 DIAGNOSIS — M6281 Muscle weakness (generalized): Secondary | ICD-10-CM

## 2021-07-14 DIAGNOSIS — G20 Parkinson's disease: Secondary | ICD-10-CM | POA: Diagnosis not present

## 2021-07-14 DIAGNOSIS — R278 Other lack of coordination: Secondary | ICD-10-CM | POA: Diagnosis not present

## 2021-07-14 DIAGNOSIS — R2681 Unsteadiness on feet: Secondary | ICD-10-CM

## 2021-07-14 NOTE — Patient Instructions (Signed)
Everyday Phrases: 10 things you say every day. Practice in your LOUD voice  Good morning!  2. It's time to take my meds.  3. It's almost time to go  4. Do you have any laundry?  5.  6.  7.  8.  9.  10.

## 2021-07-14 NOTE — Telephone Encounter (Signed)
Copied from [place] [phone]. Topic: Appointment Scheduling - Scheduling Inquiry for Clinic >> Jul 14, 2021  2:51 PM [place], Apolonio Schneiders wrote: Reason for CRM: Pt missed her appt today (9/12) and wanted to know if she could be seen this Friday with Tally Joe. Please advise.

## 2021-07-14 NOTE — Therapy (Signed)
[facility] [address] [place], [place], [address] Phone: [phone]   Fax:  [phone]  Physical Therapy Treatment  Patient Details  Name: Kendra [name] MRN: [id number] Date of Birth: [birth year] Referring Provider (PT): Arbutus Ped., MD   Encounter Date: 07/14/2021   PT End of Session - 07/14/21 1226     Visit Number 2    Number of Visits 17    Date for PT Re-Evaluation 08/13/21    Authorization Type LSVT BIG    Authorization Time Period Evaluation performed on 07/09/2021    PT Start Time 1105    PT Stop Time 1209    PT Time Calculation (min) 64 min    Equipment Utilized During Treatment Gait belt    Activity Tolerance Patient tolerated treatment well;Patient limited by fatigue;No increased pain    Behavior During Therapy [facility] for tasks assessed/performed             Past Medical History:  Diagnosis Date   Anxiety    Breast cancer ([place]) 03/2018   right breast cancer at 11:00 and 1:00   Bursitis    bilateral hips and knees   Complication of anesthesia    Depression    Family history of breast cancer 03/24/2018   Family history of lung cancer 03/24/2018   History of alcohol dependence ([place]) 2007   no ETOH; resolved since 2007   History of hiatal hernia    History of psychosis 2014   due to sleep disturbance   Hypertension    Parkinson disease ([place])    Personal history of radiation therapy 06/2018-07/2018   right breast ca   PONV (postoperative nausea and vomiting)     Past Surgical History:  Procedure Laterality Date   BREAST BIOPSY Right 03/14/2018   11:00 DCIS and invasive ductal carcinoma   BREAST BIOPSY Right 03/14/2018   1:00 Invasive ductal carcinoma   BREAST LUMPECTOMY Right 04/27/2018   lumpectomy of 11 and 1:00 cancers, clear margins, negative LN   BREAST LUMPECTOMY WITH RADIOACTIVE SEED AND SENTINEL LYMPH NODE BIOPSY Right 04/27/2018   Procedure: RIGHT BREAST LUMPECTOMY WITH  RADIOACTIVE SEED X 2 AND RIGHT SENTINEL LYMPH NODE BIOPSY;  Surgeon: [name], MD;  Location: [facility];  Service: General;  Laterality: Right;   CATARACT EXTRACTION W/PHACO Left 08/30/2019   Procedure: CATARACT EXTRACTION PHACO AND INTRAOCULAR LENS PLACEMENT ([place]) LEFT panoptix toric  01:20.5  12.7%  10.26;  Surgeon: Leandrew Koyanagi, MD;  Location: [facility] [place];  Service: Ophthalmology;  Laterality: Left;   CATARACT EXTRACTION W/PHACO Right 09/20/2019   Procedure: CATARACT EXTRACTION PHACO AND INTRAOCULAR LENS PLACEMENT ([facility]) RIGHT 5.71  00:36.4  15.7%;  Surgeon: Leandrew Koyanagi, MD;  Location: [facility];  Service: Ophthalmology;  Laterality: Right;   LAPAROTOMY N/A 02/10/2018   Procedure: EXPLORATORY LAPAROTOMY;  Surgeon: Isabel Caprice, MD;  Location: [facility];  Service: Gynecology;  Laterality: N/A;   MASS EXCISION  01/2018   abdominal   OVARIAN CYST REMOVAL     SALPINGOOPHORECTOMY Bilateral 02/10/2018   Procedure: BILATERAL SALPINGO OOPHORECTOMY; PERITONEAL WASHINGS;  Surgeon: Isabel Caprice, MD;  Location: [facility];  Service: Gynecology;  Laterality: Bilateral;   TONSILLECTOMY     TONSILLECTOMY AND ADENOIDECTOMY  1953    There were no vitals filed for this visit.   Subjective Assessment - 07/14/21 1101     Subjective Pt reports she is stressed out with trying to get to appointments. Pt  reports she took medication for pain this morning prior to PT.  Pt reports no pain currently.    Patient is accompained by: Family member   spouse, [name]   Pertinent History Pt presents to PT for LSVT BIG evaluation. Pt diagnosed with PD in 2019. Pt reports difficulty with gait speed, freezing, transfers, balance, cognition, and use of hands d/t tremors. Pt has previously attended PT for treatment of back pain. Per chart other PMH includes hx of breast cancer, HTN, MCI, insomnia, cataracts, bipolar 1, alcohol use disorder, GAD, MDD, osteopenia of neck of R  femur, chronic B low back pain with radiating pain to buttock and posterior thighs and calves    Limitations Sitting;Standing;Walking;Reading;Lifting;Writing;House hold activities    How long can you sit comfortably? limited if not well positioned. Feet must be propped up.    How long can you stand comfortably? not comfortable, pain is constant    How long can you walk comfortably? 5 min    Diagnostic tests No recent imaging pertinent to PD per chart. Other most recent imaging per [facility]anc[facility], Juanda Crumble note 06/27/2021: 'MRI lumbar spine without contrast [facility] dated 01/31/2020 imaging and report reviewed today. L5-S1 central disc bulging with severe bilateral facet arthropathy no central or foraminal stenosis. L4-5 grade 1 anterolisthesis with severe bilateral facet arthropathy and ligamentum flavum hypertrophy. She has severe right and moderate to severe left foraminal stenosis. Moderate central stenosis. L3-4 broad-based disc bulging with severe bilateral facet arthropathy and ligamentum flavum hypertrophy. Severe central stenosis. Moderate severe subarticular stenosis. No foraminal stenosis. L2-3 diffuse disc bulging with severe bilateral facet arthropathy and ligamentum flavum hypertrophy. Severe central stenosis and severe bilateral subarticular recess stenosis. Moderate left and mild to moderate right foraminal stenosis. L1-2 broad-based disc bulging with mild bilateral facet arthropathy. No central or foraminal stenosis."    Patient Stated Goals Pt would like to be able to walk faster, get up from a chair more easily    Currently in Pain? No/denies    Pain Onset More than a month ago    Pain Onset More than a month ago            LSVT BIG Treatment:  Patient seen for LSVT Daily Session Maximal Daily Exercises for facilitation/coordination of movement Sustained movements are designed to rescale the amplitude of movement output for generalization to daily functional activities.  Adapted  Maximal Daily Exercises: Floor to Ceiling 8 with 10 second hold  Side to Side 8 Bilateral with 10 second hold Forward Step and Reach 10 Bilateral Sideways Step and Reach 10 Bilateral Backwards Step and Reach 10 Bilateral* modified to Seated Maximal Daily Exercise Forwards Rock and Reach 10 Bilateral Sideways Rock and Reach 10 Bilateral* modified to Seated Maximal Daily exercise  Daily Exercise Comments: Pt needs modeling for correct positions, VC/TC for big reach, cuing for increased amplitude and increased effort.  Overall patient requires over 90% cueing for adapted and seated maximal daily exercises.  Patient requires increased cueing due to cognition impacting her ability to perform exercises with correct technique as well.  PT also provides close contact-guard assist with initial reps of standing exercises to assess patient's balance further.  Exercises 5 and 7 were modified to seated.  Fatigue slightly limited patient's ability to perform exercises where patient required rest breaks throughout session.  Functional Component Tasks - performance and progress: Sit to Stand x 5 from standard chair with cuing for big reach 2. Walk and turn 3. Bend and reach 4.  Open Jar 5. Step up on step   Hierarchy Tasks - performance and progress: Going for a walk outside (uneven, varied surfaces); pt ambulates 148 ft this session with close CGA  2. Handwriting (signing name/signature to fill forms out) - 5x cuing to write "BIG"  3. Ascending/descending stairs; practiced step-ups this session   Gait - performance and progress: PT provides cuing, VC/TC/Demo, for big steps, big posture, big arm swing 100% of time to override hypokinesia/bradykinesia. PT provides close CGA throughout due to postural instability and requirement for cuing to sustain increased amplitude. Pt ambulated 148 feet on firm surfaces with minor distractions d/t business of clinic. Comments: Pt requires hands-on assist/TC for arm swing,  particularly on L side. Frequent cues for upright posture as pt ambulates with crouched postural/reduced trunk rotation. Pt able to intermittently correct for bigger steps throughout.   Carryover Assignment: Pt instructed to use big effort and amplitude with step into home (with UE support)   Homework: Pt to perform all 7 LSVT BIG exercises (adapted and seated for 5 and 7 see above), 5 Functional Component Tasks and big walking.  Instructed patient in safety with adapted and seated maximal daily exercises.  Instructed patient and spouse for patient to stand by patient as she performs any standing exercise including sit to stands and some of the adapted maximal daily exercises.  Instructed patient not to further attempt exercises should she start to feel dizzy.  Instructed patient on use of upper extremity support with exercises.  Patient and spouse verbalized understanding.  PT also reinforced education for patient to use at least a cane with ambulation out in the community and around her house.  Patient verbalized understanding  Assessment: PT initiates LSVT big HEP this session (see note).  Patient requires over 90% cueing throughout session for all aspects of program.  PT also modified some of the adaptive daily exercises to seated for safety.  Instructed patient to cease performance of certain exercises should she start to feel dizzy.  Pt verbalized understanding. See note for details.  The patient was intermittently able to override bradykinetic and hypokinetic movement after cueing with gait and exercises.  The patient will benefit from further skilled LSVT big treatment to continue to improve active range of motion, bradykinesia, hypokinesia and gait and balance to increase safety, improve quality of life, and decrease fall risk.  Pt educated throughout session about proper posture and technique with exercises. Improved exercise technique, movement at target joints, use of target muscles after min  to mod verbal, visual, tactile cues.  Note: Portions of this document were prepared using Dragon voice recognition software and although reviewed may contain unintentional dictation errors in syntax, grammar, or spelling.    PT Education - 07/14/21 1228     Education provided Yes    Education Details LSVT adapted and seated maximal daily exercises, HEP (see note)    Person(s) Educated Patient;Spouse    Methods Explanation;Demonstration;Tactile cues;Verbal cues;Handout    Comprehension Verbalized understanding;Returned demonstration;Tactile cues required;Verbal cues required;Need further instruction              PT Short Term Goals - 07/10/21 1323       PT SHORT TERM GOAL #1   Title Pt will demonstrate independence with LSVT BIG HEP to improve strength/mobility for improved safety and ease with ADLs and to maintain/continue gains beyond LSVT program.    Baseline 9/7: to be initiated next session    Time 2    Period Weeks  Status New    Target Date 07/23/21               PT Long Term Goals - 07/10/21 1325       PT LONG TERM GOAL #1   Title Patient will increase FOTO score to equal to or greater than 47  to demonstrate statistically significant improvement in mobility and quality of life.    Baseline 9/7: 34    Time 5    Period Weeks    Status New    Target Date 08/13/21      PT LONG TERM GOAL #2   Title Patient (> 20 years old) will complete five times sit to stand test in < 15 seconds indicating decreased fall risk.    Baseline 9/7: 18.5 sec    Time 5    Period Weeks    Status New    Target Date 08/13/21      PT LONG TERM GOAL #3   Title Patient will increase 10 meter walk test to >1.3ms with WFL B step-length for 100% of test to improve ease with gait and to override hypokentic and bradykinetic movement.    Baseline 9/7: 0.74 m/s, decreased B step length and rigidty of movement    Time 5    Period Weeks    Status New    Target Date 08/13/21      PT  LONG TERM GOAL #4   Title Patient will reduce timed up and go to <11 seconds to reduce fall risk and demonstrate improved transfer/gait ability and ability to perform turns safely    Baseline 9/7: 17 seconds    Time 5    Period Weeks    Status New    Target Date 08/13/21      PT LONG TERM GOAL #5   Title Pt will demo or report ability to open standard jar using only 1-2 attempts in order to exhibit improved functional mobility for ADLs.    Baseline 9/7: Pt reports she is unable to open jars    Time 5    Period Weeks    Status New    Target Date 08/13/21                   Plan - 07/14/21 1225     Clinical Impression Statement PT initiates LSVT big HEP this session (see note).  Patient requires over 90% cueing throughout session for all aspects of program.  PT also modified some of the adaptive daily exercises to seated for safety.  Instructed patient to cease performance of certain exercises should she start to feel dizzy.  Pt verbalized understanding. See note for details.  The patient was intermittently able to override bradykinetic and hypokinetic movement after cueing with gait and exercises.  The patient will benefit from further skilled LSVT big treatment to continue to improve active range of motion, bradykinesia, hypokinesia and gait and balance to increase safety, improve quality of life, and decrease fall risk.    Personal Factors and Comorbidities Age;Sex;Fitness;Time since onset of injury/illness/exacerbation;Comorbidity 1;Comorbidity 3+;Comorbidity 2    Comorbidities MCI, HTN, CA, cataracts, bipolar 1, GAD, insomnia, chronic LBP    Examination-Activity Limitations Bend;Sit;Squat;Stairs;Stand;Locomotion Level;Self Feeding;Bathing;Transfers;Lift;Bed Mobility    Examination-Participation Restrictions Cleaning;Community Activity;Shop;Yard Work;Meal Prep;Personal Finances;Driving    Stability/Clinical Decision Making Evolving/Moderate complexity    Rehab Potential Fair     Clinical Impairments Affecting Rehab Potential MCI, chronic back pain    PT Frequency 4x / week    PT  Duration Other (comment)   5 weeks   PT Treatment/Interventions ADLs/Self Care Home Management;Therapeutic exercise;Patient/family education;Neuromuscular re-education;Therapeutic activities;Functional mobility training;Balance training;Gait training;Stair training;Taping;Energy conservation;Passive range of motion;DME Instruction;Cognitive remediation    PT Next Visit Plan Initiate LSVT BIG Maximal daily exercises, functional component tasks, hierarchy tasks, carryover tasks    PT Home Exercise Plan To provide packet of LSVT BIG exercises    Recommended Other Services Pt to perform all 7 LSVT BIG exercises (adapted and seated for 5 and 7 see above), 5 Functional Component Tasks and big walking, carryover task.    Consulted and Agree with Plan of Care Patient;Family member/caregiver    Family Member Consulted spouse             Patient will benefit from skilled therapeutic intervention in order to improve the following deficits and impairments:  Abnormal gait, Decreased activity tolerance, Decreased cognition, Decreased endurance, Decreased knowledge of use of DME, Decreased range of motion, Decreased strength, Hypomobility, Dizziness, Pain, Improper body mechanics, Impaired UE functional use, Decreased balance, Decreased coordination, Decreased mobility, Difficulty walking, Increased edema, Impaired flexibility, Increased muscle spasms, Postural dysfunction  Visit Diagnosis: Other abnormalities of gait and mobility  Unsteadiness on feet  Other lack of coordination  Muscle weakness (generalized)  Parkinson disease ([place])     Problem List Patient Active Problem List   Diagnosis Date Noted   Chronic bilateral low back pain 06/18/2021   Elevated blood pressure reading 05/23/2021   Osteopenia of neck of right femur 09/06/2020   Obesity 08/29/2020   Parkinson disease ([place]) 02/22/2020    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   Bipolar disorder, in full remission, most recent episode depressed ([place]) 11/08/2019   Avitaminosis D 08/24/2019   Insomnia due to mental condition 05/31/2019   Bipolar I disorder, most recent episode depressed ([place]) 05/31/2019   Alcohol use disorder, moderate, in sustained remission ([place]) 05/31/2019   Elevated tumor markers 05/27/2019   GAD (generalized anxiety disorder) 04/10/2019   MDD (major depressive disorder), severe ([place]) 01/31/2019   High serum carbohydrate antigen 19-9 (CA19-9) 12/01/2018   Adjustment disorder with anxious mood 11/24/2018   Essential hypertension 11/24/2018   B12 deficiency 10/17/2018   Incisional hernia, without obstruction or gangrene 09/12/2018   Genetic testing 04/07/2018   Malignant neoplasm of upper-outer quadrant of right breast in female, estrogen receptor positive ([place]) 03/17/2018   Malignant neoplasm of upper-inner quadrant of right breast in female, estrogen receptor positive ([place]) 03/17/2018   History of psychosis 02/07/2018   History of insomnia 02/07/2018    Zollie Pee, PT 07/14/2021, 12:29 PM  [place] [facility] SERVICES [address] [place], [place], [address] Phone: [phone]   Fax:  [phone]  Name: Kendra [name] MRN: [id number] Date of Birth: [birth year]

## 2021-07-14 NOTE — Therapy (Signed)
[place] MAIN [facility] SERVICES [address] [place], [place], [address] Phone: [phone]   Fax:  [phone]  Speech Language Pathology Treatment  Patient Details  Name: Kendra [name] MRN: [id number] Date of Birth: [birth year] Referring Provider (SLP): Jerrol Banana., MD   Encounter Date: 07/14/2021   End of Session - 07/14/21 1338     Visit Number 2    Number of Visits 17    Date for SLP Re-Evaluation 10/07/21    SLP Start Time 1000    SLP Stop Time  1100    SLP Time Calculation (min) 60 min    Activity Tolerance Patient tolerated treatment well             Past Medical History:  Diagnosis Date   Anxiety    Breast cancer ([place]) 03/2018   right breast cancer at 11:00 and 1:00   Bursitis    bilateral hips and knees   Complication of anesthesia    Depression    Family history of breast cancer 03/24/2018   Family history of lung cancer 03/24/2018   History of alcohol dependence ([place]) 2007   no ETOH; resolved since 2007   History of hiatal hernia    History of psychosis 2014   due to sleep disturbance   Hypertension    Parkinson disease ([place])    Personal history of radiation therapy 06/2018-07/2018   right breast ca   PONV (postoperative nausea and vomiting)     Past Surgical History:  Procedure Laterality Date   BREAST BIOPSY Right 03/14/2018   11:00 DCIS and invasive ductal carcinoma   BREAST BIOPSY Right 03/14/2018   1:00 Invasive ductal carcinoma   BREAST LUMPECTOMY Right 04/27/2018   lumpectomy of 11 and 1:00 cancers, clear margins, negative LN   BREAST LUMPECTOMY WITH RADIOACTIVE SEED AND SENTINEL LYMPH NODE BIOPSY Right 04/27/2018   Procedure: RIGHT BREAST LUMPECTOMY WITH RADIOACTIVE SEED X 2 AND RIGHT SENTINEL LYMPH NODE BIOPSY;  Surgeon: [name], MD;  Location: [place];  Service: General;  Laterality: Right;   CATARACT EXTRACTION W/PHACO Left 08/30/2019   Procedure: CATARACT  EXTRACTION PHACO AND INTRAOCULAR LENS PLACEMENT ([place]) LEFT panoptix toric  01:20.5  12.7%  10.26;  Surgeon: Leandrew Koyanagi, MD;  Location: [place];  Service: Ophthalmology;  Laterality: Left;   CATARACT EXTRACTION W/PHACO Right 09/20/2019   Procedure: CATARACT EXTRACTION PHACO AND INTRAOCULAR LENS PLACEMENT ([facility]) RIGHT 5.71  00:36.4  15.7%;  Surgeon: Leandrew Koyanagi, MD;  Location: [place];  Service: Ophthalmology;  Laterality: Right;   LAPAROTOMY N/A 02/10/2018   Procedure: EXPLORATORY LAPAROTOMY;  Surgeon: Isabel Caprice, MD;  Location: [facility];  Service: Gynecology;  Laterality: N/A;   MASS EXCISION  01/2018   abdominal   OVARIAN CYST REMOVAL     SALPINGOOPHORECTOMY Bilateral 02/10/2018   Procedure: BILATERAL SALPINGO OOPHORECTOMY; PERITONEAL WASHINGS;  Surgeon: Isabel Caprice, MD;  Location: [facility];  Service: Gynecology;  Laterality: Bilateral;   TONSILLECTOMY     TONSILLECTOMY AND ADENOIDECTOMY  1953    There were no vitals filed for this visit.   Subjective Assessment - 07/14/21 1330     Subjective "Confused," pt reports, about getting to therapy location    Patient is accompained by: Family member   [name]   Currently in Pain? No/denies                   ADULT SLP TREATMENT - 07/14/21 1331  General Information   Behavior/Cognition Alert;Cooperative    HPI Patient is a 76 y.o. female with history of Parkinson's disease, anxiety, depression, psychosis, HTN. bipolar disorder, alcoholism, memory issues since 2005, hx head trauma. Referred for LSVT BIG and LOUD.      Treatment Provided   Treatment provided Cognitive-Linquistic      Pain Assessment   Pain Assessment No/denies pain      Cognitive-Linquistic Treatment   Treatment focused on Dysarthria;Patient/family/caregiver education    Skilled Treatment LSVT Daily tasks with usual moderate cues. Loud /a/ averaged 81 dB, duration 10.2 seconds, pitch avg 226 Hz.  Max F0 for  pitch glides was 288 Hz (83dB avg), min F0 for low glides was 161 Hz (82 dB avg). Functional phrases averaged 82 dB with mod cues for loudness. Hierarchical loudness drills at word/phrase level averaged 79 dB, with moderate cues for loudness.        Assessment / Recommendations / Plan   Plan Continue with current plan of care      Progression Toward Goals   Progression toward goals Progressing toward goals              SLP Education - 07/14/21 1338     Education Details LSVT loud tasks, home program    Person(s) Educated Patient;Spouse    Methods Explanation;Handout    Comprehension Verbalized understanding              SLP Short Term Goals - 07/10/21 1936       SLP SHORT TERM GOAL #1   Title The patient will complete Daily Tasks (Maximum duration "ah", High/Lows, and Functional Phrases) at average loudness >/= 85 dB and with loud, good quality voice with min cues.    Time 10    Period --   sessions   Status New      SLP SHORT TERM GOAL #2   Title The patient will complete Hierarchal Speech Loudness reading drills (words/phrases, sentences) at average >/= 75 dB and with loud, good quality voice with min cues.    Time 10    Period --   sessions   Status New      SLP SHORT TERM GOAL #3   Title The patient will participate in 5-8 minutes conversation, maintaining average loudness of 75 dB and loud, good quality voice with min cues.    Time 10    Period --   sessions   Status New              SLP Long Term Goals - 07/10/21 1937       SLP LONG TERM GOAL #1   Title The patient will complete Daily Tasks (Maximum duration "ah", High/Lows, and Functional Phrases) at average loudness >/= 85 dB and with loud, good quality voice    Time 4    Period Weeks   or 17 sessions, for all LTGs   Status New    Target Date 08/10/21      SLP LONG TERM GOAL #2   Title The patient will complete Hierarchal Speech Loudness reading drills (words/phrases, sentences, and paragraph)  at average >/= 75 dB and with loud, good quality voice.    Time 4    Period Weeks    Status New    Target Date 08/10/21      SLP LONG TERM GOAL #3   Title The patient will participate in 15-20 minutes conversation, maintaining average loudness of >/= 75 dB and loud, good quality voice.  Time 4    Period Weeks    Status New    Target Date 08/10/21      SLP LONG TERM GOAL #4   Title Patient will report improved communication effectiveness as measured by Communicative Effectiveness Survey.    Baseline 19/32 at eval    Time 4    Period Weeks    Status New    Target Date 08/10/21              Plan - 07/14/21 1339     Clinical Impression Statement Patient presents with mild hypokinetic dysarthria characterized by reduced vocal intensity, short rushes of speech/variable rate, and hoarse vocal quality. Patient also has cognitive impairment, per chart review progressive since 2005. Per neurology notes, cause is likely multifactorial given hx which includes head trauma, bipolar disorder, and alchoholism. Patient was stimulable for improved loudness and vocal quality using intensity-based cues and clinician model; reports difficulty adjusting to louder voice with her hearing aids. I recommend skilled ST for LSVT LOUD 4x a week for 4 weeks in order to improve vocal quality, intelligibility, and communicative effectiveness.    Speech Therapy Frequency 4x / week    Duration 4 weeks    Treatment/Interventions Language facilitation;Environmental controls;Cueing hierarchy;SLP instruction and feedback;Compensatory techniques;Functional tasks;Patient/family education;Multimodal communcation approach   LSVT LOUD   Potential to Achieve Goals Good    Potential Considerations Ability to learn/carryover information    SLP Home Exercise Plan to be provided next session    Consulted and Agree with Plan of Care Patient;Family member/caregiver    Family Member Consulted spouse             Patient  will benefit from skilled therapeutic intervention in order to improve the following deficits and impairments:   Dysarthria and anarthria  Parkinson disease ([place])    Problem List Patient Active Problem List   Diagnosis Date Noted   Chronic bilateral low back pain 06/18/2021   Elevated blood pressure reading 05/23/2021   Osteopenia of neck of right femur 09/06/2020   Obesity 08/29/2020   Parkinson disease ([place]) 02/22/2020   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   Bipolar disorder, in full remission, most recent episode depressed ([place]) 11/08/2019   Avitaminosis D 08/24/2019   Insomnia due to mental condition 05/31/2019   Bipolar I disorder, most recent episode depressed ([place]) 05/31/2019   Alcohol use disorder, moderate, in sustained remission ([place]) 05/31/2019   Elevated tumor markers 05/27/2019   GAD (generalized anxiety disorder) 04/10/2019   MDD (major depressive disorder), severe ([place]) 01/31/2019   High serum carbohydrate antigen 19-9 (CA19-9) 12/01/2018   Adjustment disorder with anxious mood 11/24/2018   Essential hypertension 11/24/2018   B12 deficiency 10/17/2018   Incisional hernia, without obstruction or gangrene 09/12/2018   Genetic testing 04/07/2018   Malignant neoplasm of upper-outer quadrant of right breast in female, estrogen receptor positive ([place]) 03/17/2018   Malignant neoplasm of upper-inner quadrant of right breast in female, estrogen receptor positive ([place]) 03/17/2018   History of psychosis 02/07/2018   History of insomnia 02/07/2018   Deneise Lever, [place], CCC-SLP Speech-Language Pathologist  Aliene Altes 07/14/2021, 1:39 PM  [place] MAIN [facility] SERVICES [address] [place], [place], [address] Phone: [phone]   Fax:  [phone]   Name: Kendra [name] MRN: [id number] Date of Birth: Feb 28, 1946

## 2021-07-14 NOTE — Telephone Encounter (Signed)
Patient scheduled for October

## 2021-07-15 ENCOUNTER — Ambulatory Visit: Payer: Medicare PPO

## 2021-07-15 ENCOUNTER — Ambulatory Visit: Payer: Medicare PPO | Admitting: Speech Pathology

## 2021-07-15 ENCOUNTER — Other Ambulatory Visit: Payer: Self-pay | Admitting: Family Medicine

## 2021-07-15 DIAGNOSIS — R471 Dysarthria and anarthria: Secondary | ICD-10-CM

## 2021-07-15 DIAGNOSIS — R2681 Unsteadiness on feet: Secondary | ICD-10-CM | POA: Diagnosis not present

## 2021-07-15 DIAGNOSIS — R278 Other lack of coordination: Secondary | ICD-10-CM

## 2021-07-15 DIAGNOSIS — R2689 Other abnormalities of gait and mobility: Secondary | ICD-10-CM | POA: Diagnosis not present

## 2021-07-15 DIAGNOSIS — G20 Parkinson's disease: Secondary | ICD-10-CM

## 2021-07-15 DIAGNOSIS — M79605 Pain in left leg: Secondary | ICD-10-CM | POA: Diagnosis not present

## 2021-07-15 DIAGNOSIS — M79604 Pain in right leg: Secondary | ICD-10-CM | POA: Diagnosis not present

## 2021-07-15 DIAGNOSIS — M6281 Muscle weakness (generalized): Secondary | ICD-10-CM | POA: Diagnosis not present

## 2021-07-15 NOTE — Therapy (Signed)
[place] MAIN [facility] SERVICES [address] [place], [place], [address] Phone: [phone]   Fax:  [phone]  Physical Therapy Treatment  Patient Details  Name: Kendra [name] MRN: [id number] Date of Birth: [birth year] Referring Provider (PT): Arbutus Ped., MD   Encounter Date: 07/15/2021   PT End of Session - 07/15/21 1847     Visit Number 3    Number of Visits 17    Date for PT Re-Evaluation 08/13/21    Authorization Type LSVT BIG    Authorization Time Period Evaluation performed on 07/09/2021    PT Start Time 1502    PT Stop Time 1600    PT Time Calculation (min) 58 min    Equipment Utilized During Treatment Gait belt    Activity Tolerance Patient tolerated treatment well;Patient limited by fatigue;No increased pain    Behavior During Therapy [facility] for tasks assessed/performed             Past Medical History:  Diagnosis Date   Anxiety    Breast cancer ([place]) 03/2018   right breast cancer at 11:00 and 1:00   Bursitis    bilateral hips and knees   Complication of anesthesia    Depression    Family history of breast cancer 03/24/2018   Family history of lung cancer 03/24/2018   History of alcohol dependence ([place]) 2007   no ETOH; resolved since 2007   History of hiatal hernia    History of psychosis 2014   due to sleep disturbance   Hypertension    Parkinson disease ([place])    Personal history of radiation therapy 06/2018-07/2018   right breast ca   PONV (postoperative nausea and vomiting)     Past Surgical History:  Procedure Laterality Date   BREAST BIOPSY Right 03/14/2018   11:00 DCIS and invasive ductal carcinoma   BREAST BIOPSY Right 03/14/2018   1:00 Invasive ductal carcinoma   BREAST LUMPECTOMY Right 04/27/2018   lumpectomy of 11 and 1:00 cancers, clear margins, negative LN   BREAST LUMPECTOMY WITH RADIOACTIVE SEED AND SENTINEL LYMPH NODE BIOPSY Right 04/27/2018   Procedure: RIGHT BREAST LUMPECTOMY WITH  RADIOACTIVE SEED X 2 AND RIGHT SENTINEL LYMPH NODE BIOPSY;  Surgeon: [name], MD;  Location: [place];  Service: General;  Laterality: Right;   CATARACT EXTRACTION W/PHACO Left 08/30/2019   Procedure: CATARACT EXTRACTION PHACO AND INTRAOCULAR LENS PLACEMENT ([place]) LEFT panoptix toric  01:20.5  12.7%  10.26;  Surgeon: Leandrew Koyanagi, MD;  Location: [place];  Service: Ophthalmology;  Laterality: Left;   CATARACT EXTRACTION W/PHACO Right 09/20/2019   Procedure: CATARACT EXTRACTION PHACO AND INTRAOCULAR LENS PLACEMENT ([facility]) RIGHT 5.71  00:36.4  15.7%;  Surgeon: Leandrew Koyanagi, MD;  Location: [place];  Service: Ophthalmology;  Laterality: Right;   LAPAROTOMY N/A 02/10/2018   Procedure: EXPLORATORY LAPAROTOMY;  Surgeon: Isabel Caprice, MD;  Location: [facility];  Service: Gynecology;  Laterality: N/A;   MASS EXCISION  01/2018   abdominal   OVARIAN CYST REMOVAL     SALPINGOOPHORECTOMY Bilateral 02/10/2018   Procedure: BILATERAL SALPINGO OOPHORECTOMY; PERITONEAL WASHINGS;  Surgeon: Isabel Caprice, MD;  Location: [facility];  Service: Gynecology;  Laterality: Bilateral;   TONSILLECTOMY     TONSILLECTOMY AND ADENOIDECTOMY  1953    There were no vitals filed for this visit.   Subjective Assessment - 07/15/21 1844     Subjective Pt reports compliance with HEP. Pt reports performing carryover task and success with  task (was able to open a jar). She feels her technique has already improved with exercises. Pt having her usual pains.    Currently in Pain? Yes               LSVT BIG Treatment:   Patient seen for LSVT Daily Session Maximal Daily Exercises for facilitation/coordination of movement Sustained movements are designed to rescale the amplitude of movement output for generalization to daily functional activities.   Adapted Maximal Daily Exercises: Floor to Ceiling 8 with 10 second hold  Side to Side 8 Bilateral with 10 second  hold Forward Step and Reach 10 Bilateral Sideways Step and Reach 10 Bilateral Backwards Step and Reach 10 Bilateral - modified to Seated Maximal Daily Exercise Forwards Rock and Reach 10 Bilateral Sideways Rock and Reach 10 Bilateral - modified to Seated Maximal Daily exercise   Daily Exercise Comments: Pt exhibits improved postural stability with all standing maximal daily exercises, requiring SBA only for initial couple reps. Pt still performing exercises 5 and 7 seated. She requires >90% cuing for technique. PT provides frequent hands-on assist for finger abduction and UE flexion/abduction. Pt did have some difficulty with coordination of exercises when they required B shoulder extension, requiring more cuing. Pt did show improvement with exercises requiring trunk rotation and was able to maintain a more upright posture throughout.    Functional Component Tasks - performance and progress: Sit to Stand x 5 from standard chair with cuing for big reach, performing modified version where pt holds on to chair with one hand for initial bend/reach 2. Walk and turn 5x 3. Bend and reach 5x 4. Open Jar 5x 5. Step up on step 5x Comments: Pt shows improved amplitude and reduced bradykinesia with first three reps of opening a jar. She also exhibits improved amplitude with step up on step. Walking and turning is still challenging for pt in regard to sequencing, minor decrease in postural stability, and overriding bradykinesia. Pt intermittently able to override bradykinesia with bending and reaching.    Hierarchy Tasks - performance and progress: Going for a walk outside (uneven, varied surfaces); pt ambulates 2x148-58 ft this session with close CGA  (incorporated into gait training). Pt cuing to maintain big steps with turning. She requires seated rest break d/t fatigue.  2. Handwriting (signing name/signature to fill forms out) - 5x cuing to write "BIG"; able to increase size of signature compared to prior  session. 3. Ascending/descending stairs; practiced step-ups this session (see above for comments).   Gait - performance and progress: close CGA provided throughout d/t postural instability PT continues to provide cuing, VC/TC/Demo, for big steps, big posture, big arm swing approx 100% of time to override hypokinesia/bradykinesia. Pt ambulated [phone], 1x60 ft feet on firm surfaces with moderate distractions d/t business of clinic. Pt reports feeling "disoriented" at one point (she reports this happens sometimes when she cannot see her husband), and requires seated rest break d/t fatigue as well. After seated rest break pt was able to ambulate remaining 60 ft. Aside from fatigue, she was most challenged with B shoulder extension, infrequently able to correct technique with hands-on assist.    Carryover Assignment: Pt instructed to use big effort and amplitude when reaching for ice cream at grocery store.   Homework: Pt to perform all 7 LSVT BIG exercises (adapted and seated for 5 and 7 see above), 5 Functional Component Tasks and big walking.  Instructed patient in safety with adapted and seated maximal daily exercises.  PT reinforced  the following from prior session: Instructed patient and spouse for patient to stand by patient as she performs any standing exercise including sit to stands and some of the adapted maximal daily exercises.  Instructed patient not to further attempt exercises should she start to feel dizzy.  Instructed patient on use of upper extremity support with exercises.  Patient and spouse verbalized understanding.  PT also reinforced education for patient to use at least a cane with ambulation out in the community and around her house.  Patient verbalized understanding. Updated and instructed pt in carryover task.   Assessment: Pt shows progress with technique with maximal daily exercises on this date. She shows improved trunk rotation, postural stability and ability to sustain  upright posture. Pt with greatest improvements overriding hypokinetic and bradykinetic movements with step-ups on step, writing her signature and opening a jar. She is still limited with gait d/t fatigue, and is most challenged with shoulder extension with exercises and with arm-swing during gait. Pt is also challenged with turns (both regarding stability and amplitude of movement). Overall, the pt still requires >90% cuing throughout session for technique.  The pt will benefit from further skilled LSVT BIG PT treatments to continue to improve mobility, bradykinesia, hypokinesia, balance and gait to decrease fall risk and improve quality of life.     Pt educated throughout session about proper posture and technique with exercises. Improved exercise technique, movement at target joints, use of target muscles after min to mod verbal, visual, tactile cues.     PT Education - 07/15/21 1846     Education provided Yes    Education Details Continued education regarding LSVT adapted and seated maximal daily exercises, functional component tasks, hierarchy tasks, and updated carryover assignment. Exercise technique, body mechanics    Person(s) Educated Patient;Spouse    Methods Explanation;Tactile cues;Demonstration;Verbal cues;Handout    Comprehension Verbalized understanding;Returned demonstration;Tactile cues required;Verbal cues required;Need further instruction              PT Short Term Goals - 07/10/21 1323       PT SHORT TERM GOAL #1   Title Pt will demonstrate independence with LSVT BIG HEP to improve strength/mobility for improved safety and ease with ADLs and to maintain/continue gains beyond LSVT program.    Baseline 9/7: to be initiated next session    Time 2    Period Weeks    Status New    Target Date 07/23/21               PT Long Term Goals - 07/10/21 1325       PT LONG TERM GOAL #1   Title Patient will increase FOTO score to equal to or greater than 47  to  demonstrate statistically significant improvement in mobility and quality of life.    Baseline 9/7: 34    Time 5    Period Weeks    Status New    Target Date 08/13/21      PT LONG TERM GOAL #2   Title Patient (> 19 years old) will complete five times sit to stand test in < 15 seconds indicating decreased fall risk.    Baseline 9/7: 18.5 sec    Time 5    Period Weeks    Status New    Target Date 08/13/21      PT LONG TERM GOAL #3   Title Patient will increase 10 meter walk test to >1.85ms with WFL B step-length for 100% of test to improve ease  with gait and to override hypokentic and bradykinetic movement.    Baseline 9/7: 0.74 m/s, decreased B step length and rigidty of movement    Time 5    Period Weeks    Status New    Target Date 08/13/21      PT LONG TERM GOAL #4   Title Patient will reduce timed up and go to <11 seconds to reduce fall risk and demonstrate improved transfer/gait ability and ability to perform turns safely    Baseline 9/7: 17 seconds    Time 5    Period Weeks    Status New    Target Date 08/13/21      PT LONG TERM GOAL #5   Title Pt will demo or report ability to open standard jar using only 1-2 attempts in order to exhibit improved functional mobility for ADLs.    Baseline 9/7: Pt reports she is unable to open jars    Time 5    Period Weeks    Status New    Target Date 08/13/21                   Plan - 07/15/21 1906     Clinical Impression Statement Pt shows progress with technique with maximal daily exercises on this date. She shows improved trunk rotation, postural stability and ability to sustain upright posture. Pt with greatest improvements overriding hypokinetic and bradykinetic movements with step-ups on step, writing her signature and opening a jar. She is still limited with gait d/t fatigue, and is most challenged with shoulder extension with exercises and with arm-swing during gait. Pt is also challenged with turns (both regarding  stability and amplitude of movement). Overall, the pt still requires >90% cuing throughout session for technique.  The pt will benefit from further skilled LSVT BIG PT treatments to continue to improve mobility, bradykinesia, hypokinesia, balance and gait to decrease fall risk and improve quality of life.    Personal Factors and Comorbidities Age;Sex;Fitness;Time since onset of injury/illness/exacerbation;Comorbidity 1;Comorbidity 3+;Comorbidity 2    Comorbidities MCI, HTN, CA, cataracts, bipolar 1, GAD, insomnia, chronic LBP    Examination-Activity Limitations Bend;Sit;Squat;Stairs;Stand;Locomotion Level;Self Feeding;Bathing;Transfers;Lift;Bed Mobility    Examination-Participation Restrictions Cleaning;Community Activity;Shop;Yard Work;Meal Prep;Personal Finances;Driving    Stability/Clinical Decision Making Evolving/Moderate complexity    Rehab Potential Fair    Clinical Impairments Affecting Rehab Potential MCI, chronic back pain    PT Frequency 4x / week    PT Duration Other (comment)   5 weeks   PT Treatment/Interventions ADLs/Self Care Home Management;Therapeutic exercise;Patient/family education;Neuromuscular re-education;Therapeutic activities;Functional mobility training;Balance training;Gait training;Stair training;Taping;Energy conservation;Passive range of motion;DME Instruction;Cognitive remediation    PT Next Visit Plan Initiate LSVT BIG Maximal daily exercises (see note for adaptions), functional component tasks, hierarchy tasks, carryover tasks, big gait/walking    PT Home Exercise Plan LSVT 7 adapted maximal daily exercises (exercies 5 and 7 performed with seated versions), 5 functional component tasks, hierarchy tasks, big walking, and carryover task (updated 9/13)    Consulted and Agree with Plan of Care Patient;Family member/caregiver    Family Member Consulted spouse             Patient will benefit from skilled therapeutic intervention in order to improve the following  deficits and impairments:  Abnormal gait, Decreased activity tolerance, Decreased cognition, Decreased endurance, Decreased knowledge of use of DME, Decreased range of motion, Decreased strength, Hypomobility, Dizziness, Pain, Improper body mechanics, Impaired UE functional use, Decreased balance, Decreased coordination, Decreased mobility, Difficulty walking, Increased edema, Impaired  flexibility, Increased muscle spasms, Postural dysfunction  Visit Diagnosis: Other abnormalities of gait and mobility  Other lack of coordination  Unsteadiness on feet  Parkinson disease ([place])     Problem List Patient Active Problem List   Diagnosis Date Noted   Chronic bilateral low back pain 06/18/2021   Elevated blood pressure reading 05/23/2021   Osteopenia of neck of right femur 09/06/2020   Obesity 08/29/2020   Parkinson disease ([place]) 02/22/2020   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   Bipolar disorder, in full remission, most recent episode depressed ([place]) 11/08/2019   Avitaminosis D 08/24/2019   Insomnia due to mental condition 05/31/2019   Bipolar I disorder, most recent episode depressed ([place]) 05/31/2019   Alcohol use disorder, moderate, in sustained remission ([place]) 05/31/2019   Elevated tumor markers 05/27/2019   GAD (generalized anxiety disorder) 04/10/2019   MDD (major depressive disorder), severe ([employer]) 01/31/2019   High serum carbohydrate antigen 19-9 (CA19-9) 12/01/2018   Adjustment disorder with anxious mood 11/24/2018   Essential hypertension 11/24/2018   B12 deficiency 10/17/2018   Incisional hernia, without obstruction or gangrene 09/12/2018   Genetic testing 04/07/2018   Malignant neoplasm of upper-outer quadrant of right breast in female, estrogen receptor positive ([place]) 03/17/2018   Malignant neoplasm of upper-inner quadrant of right breast in female, estrogen receptor positive ([place]) 03/17/2018   History of psychosis 02/07/2018   History of insomnia 02/07/2018    Zollie Pee, PT 07/15/2021, 7:09 PM  [place] MAIN [facility] SERVICES [place], [place], [address] Phone: [phone]   Fax:  [phone]  Name: Elrose Colacino MRN: [id number] Date of Birth: 1946-07-02

## 2021-07-16 ENCOUNTER — Other Ambulatory Visit: Payer: Self-pay

## 2021-07-16 ENCOUNTER — Ambulatory Visit: Payer: Medicare PPO | Admitting: Speech Pathology

## 2021-07-16 ENCOUNTER — Ambulatory Visit: Payer: Medicare PPO

## 2021-07-16 DIAGNOSIS — G20 Parkinson's disease: Secondary | ICD-10-CM

## 2021-07-16 DIAGNOSIS — R278 Other lack of coordination: Secondary | ICD-10-CM

## 2021-07-16 DIAGNOSIS — R471 Dysarthria and anarthria: Secondary | ICD-10-CM | POA: Diagnosis not present

## 2021-07-16 DIAGNOSIS — R2689 Other abnormalities of gait and mobility: Secondary | ICD-10-CM

## 2021-07-16 DIAGNOSIS — M79605 Pain in left leg: Secondary | ICD-10-CM | POA: Diagnosis not present

## 2021-07-16 DIAGNOSIS — M6281 Muscle weakness (generalized): Secondary | ICD-10-CM | POA: Diagnosis not present

## 2021-07-16 DIAGNOSIS — R2681 Unsteadiness on feet: Secondary | ICD-10-CM | POA: Diagnosis not present

## 2021-07-16 DIAGNOSIS — M79604 Pain in right leg: Secondary | ICD-10-CM | POA: Diagnosis not present

## 2021-07-16 NOTE — Therapy (Signed)
[place] MAIN [facility] SERVICES [address] [place], [place], [address] Phone: [phone]   Fax:  [phone]  Speech Language Pathology Treatment  Patient Details  Name: Kendra [name] MRN: [id number] Date of Birth: 12-14-1945 Referring Provider (SLP): Jerrol Banana., MD   Encounter Date: 07/15/2021   End of Session - 07/16/21 0827     Visit Number 3    Number of Visits 17    Date for SLP Re-Evaluation 10/07/21    SLP Start Time 1400    SLP Stop Time  1500    SLP Time Calculation (min) 60 min    Activity Tolerance Patient tolerated treatment well             Past Medical History:  Diagnosis Date   Anxiety    Breast cancer ([place]) 03/2018   right breast cancer at 11:00 and 1:00   Bursitis    bilateral hips and knees   Complication of anesthesia    Depression    Family history of breast cancer 03/24/2018   Family history of lung cancer 03/24/2018   History of alcohol dependence ([place]) 2007   no ETOH; resolved since 2007   History of hiatal hernia    History of psychosis 2014   due to sleep disturbance   Hypertension    Parkinson disease ([place])    Personal history of radiation therapy 06/2018-07/2018   right breast ca   PONV (postoperative nausea and vomiting)     Past Surgical History:  Procedure Laterality Date   BREAST BIOPSY Right 03/14/2018   11:00 DCIS and invasive ductal carcinoma   BREAST BIOPSY Right 03/14/2018   1:00 Invasive ductal carcinoma   BREAST LUMPECTOMY Right 04/27/2018   lumpectomy of 11 and 1:00 cancers, clear margins, negative LN   BREAST LUMPECTOMY WITH RADIOACTIVE SEED AND SENTINEL LYMPH NODE BIOPSY Right 04/27/2018   Procedure: RIGHT BREAST LUMPECTOMY WITH RADIOACTIVE SEED X 2 AND RIGHT SENTINEL LYMPH NODE BIOPSY;  Surgeon: [name], MD;  Location: [place];  Service: General;  Laterality: Right;   CATARACT EXTRACTION W/PHACO Left 08/30/2019   Procedure: CATARACT  EXTRACTION PHACO AND INTRAOCULAR LENS PLACEMENT ([place]) LEFT panoptix toric  01:20.5  12.7%  10.26;  Surgeon: Leandrew Koyanagi, MD;  Location: [place];  Service: Ophthalmology;  Laterality: Left;   CATARACT EXTRACTION W/PHACO Right 09/20/2019   Procedure: CATARACT EXTRACTION PHACO AND INTRAOCULAR LENS PLACEMENT ([facility]) RIGHT 5.71  00:36.4  15.7%;  Surgeon: Leandrew Koyanagi, MD;  Location: [place] [facility];  Service: Ophthalmology;  Laterality: Right;   LAPAROTOMY N/A 02/10/2018   Procedure: EXPLORATORY LAPAROTOMY;  Surgeon: Isabel Caprice, MD;  Location: [facility];  Service: Gynecology;  Laterality: N/A;   MASS EXCISION  01/2018   abdominal   OVARIAN CYST REMOVAL     SALPINGOOPHORECTOMY Bilateral 02/10/2018   Procedure: BILATERAL SALPINGO OOPHORECTOMY; PERITONEAL WASHINGS;  Surgeon: Isabel Caprice, MD;  Location: [facility];  Service: Gynecology;  Laterality: Bilateral;   TONSILLECTOMY     TONSILLECTOMY AND ADENOIDECTOMY  1953    There were no vitals filed for this visit.   Subjective Assessment - 07/16/21 0827     Subjective Completed her HEP yesterday    Patient is accompained by: Family member    Currently in Pain? No/denies   none at rest                  ADULT SLP TREATMENT - 07/16/21 [id number]  General Information   Behavior/Cognition Alert;Cooperative    HPI Patient is a 75 y.o. female with history of Parkinson's disease, anxiety, depression, psychosis, HTN. bipolar disorder, alcoholism, memory issues since 2005, hx head trauma. Referred for LSVT BIG and LOUD.      Cognitive-Linquistic Treatment   Treatment focused on Dysarthria;Patient/family/caregiver education    Skilled Treatment LSVT Daily tasks with occasional mod cues. Loud /a/ averaged 83 dB, duration 9.5 seconds, pitch avg 225 Hz, moderate cues for shaping vocal quality.  Max F0 for pitch glides was 278 Hz (82dB avg), min F0 for low glides was 160 Hz (82 dB avg). Functional phrases averaged 84  dB with modified independence. Hierarchical loudness drills at phrase level averaged 82 dB, with occasional min cues for loudness.         Assessment / Recommendations / Plan   Plan Continue with current plan of care      Progression Toward Goals   Progression toward goals Progressing toward goals              SLP Education - 07/16/21 0827     Education Details Loud voice all the time    Person(s) Educated Patient;Spouse    Methods Explanation    Comprehension Verbalized understanding              SLP Short Term Goals - 07/10/21 1936       SLP SHORT TERM GOAL #1   Title The patient will complete Daily Tasks (Maximum duration "ah", High/Lows, and Functional Phrases) at average loudness >/= 85 dB and with loud, good quality voice with min cues.    Time 10    Period --   sessions   Status New      SLP SHORT TERM GOAL #2   Title The patient will complete Hierarchal Speech Loudness reading drills (words/phrases, sentences) at average >/= 75 dB and with loud, good quality voice with min cues.    Time 10    Period --   sessions   Status New      SLP SHORT TERM GOAL #3   Title The patient will participate in 5-8 minutes conversation, maintaining average loudness of 75 dB and loud, good quality voice with min cues.    Time 10    Period --   sessions   Status New              SLP Long Term Goals - 07/10/21 1937       SLP LONG TERM GOAL #1   Title The patient will complete Daily Tasks (Maximum duration "ah", High/Lows, and Functional Phrases) at average loudness >/= 85 dB and with loud, good quality voice    Time 4    Period Weeks   or 17 sessions, for all LTGs   Status New    Target Date 08/10/21      SLP LONG TERM GOAL #2   Title The patient will complete Hierarchal Speech Loudness reading drills (words/phrases, sentences, and paragraph) at average >/= 75 dB and with loud, good quality voice.    Time 4    Period Weeks    Status New    Target Date 08/10/21       SLP LONG TERM GOAL #3   Title The patient will participate in 15-20 minutes conversation, maintaining average loudness of >/= 75 dB and loud, good quality voice.    Time 4    Period Weeks    Status New    Target Date  08/10/21      SLP LONG TERM GOAL #4   Title Patient will report improved communication effectiveness as measured by Communicative Effectiveness Survey.    Baseline 19/32 at eval    Time 4    Period Weeks    Status New    Target Date 08/10/21              Plan - 07/16/21 [id number]     Clinical Impression Statement Patient presents with mild hypokinetic dysarthria characterized by reduced vocal intensity, short rushes of speech/variable rate, and hoarse vocal quality.Patient was stimulable for improved loudness and vocal quality using intensity-based cues and clinician model; demonstrated some self-correction/carryover intermittently after structured tasks and greeted PT using LOUD voice without cues. I recommend skilled ST for LSVT LOUD 4x a week for 4 weeks in order to improve vocal quality, intelligibility, and communicative effectiveness.    Speech Therapy Frequency 4x / week    Duration 4 weeks    Treatment/Interventions Language facilitation;Environmental controls;Cueing hierarchy;SLP instruction and feedback;Compensatory techniques;Functional tasks;Patient/family education;Multimodal communcation approach   LSVT LOUD   Potential to Achieve Goals Good    Potential Considerations Ability to learn/carryover information    SLP Home Exercise Plan to be provided next session    Consulted and Agree with Plan of Care Patient;Family member/caregiver    Family Member Consulted spouse             Patient will benefit from skilled therapeutic intervention in order to improve the following deficits and impairments:   Dysarthria and anarthria  Parkinson disease ([place])    Problem List Patient Active Problem List   Diagnosis Date Noted   Chronic bilateral low back  pain 06/18/2021   Elevated blood pressure reading 05/23/2021   Osteopenia of neck of right femur 09/06/2020   Obesity 08/29/2020   Parkinson disease ([place]) 02/22/2020   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   Bipolar disorder, in full remission, most recent episode depressed ([place]) 11/08/2019   Avitaminosis D 08/24/2019   Insomnia due to mental condition 05/31/2019   Bipolar I disorder, most recent episode depressed ([place]) 05/31/2019   Alcohol use disorder, moderate, in sustained remission ([place]) 05/31/2019   Elevated tumor markers 05/27/2019   GAD (generalized anxiety disorder) 04/10/2019   MDD (major depressive disorder), severe ([place]) 01/31/2019   High serum carbohydrate antigen 19-9 (CA19-9) 12/01/2018   Adjustment disorder with anxious mood 11/24/2018   Essential hypertension 11/24/2018   B12 deficiency 10/17/2018   Incisional hernia, without obstruction or gangrene 09/12/2018   Genetic testing 04/07/2018   Malignant neoplasm of upper-outer quadrant of right breast in female, estrogen receptor positive ([place]) 03/17/2018   Malignant neoplasm of upper-inner quadrant of right breast in female, estrogen receptor positive ([place]) 03/17/2018   History of psychosis 02/07/2018   History of insomnia 02/07/2018   Deneise Lever, [place], CCC-SLP Speech-Language Pathologist  Aliene Altes 07/16/2021, 8:29 AM  [place] [address] [place], [place], [address] Phone: [phone]   Fax:  [phone]   Name: Cleofas Hayner MRN: [id number] Date of Birth: June 23, 1946

## 2021-07-16 NOTE — Therapy (Signed)
[place] MAIN [facility] SERVICES [address] [place], [place], [address] Phone: [phone]   Fax:  [phone]  Speech Language Pathology Treatment  Patient Details  Name: Kendra [name] MRN: [id number] Date of Birth: 17-Feb-1946 Referring Provider (SLP): Jerrol Banana., MD   Encounter Date: 07/16/2021   End of Session - 07/16/21 1349     Visit Number 4    Number of Visits 17    Date for SLP Re-Evaluation 10/07/21    SLP Start Time 1000    SLP Stop Time  1100    SLP Time Calculation (min) 60 min    Activity Tolerance Patient tolerated treatment well             Past Medical History:  Diagnosis Date   Anxiety    Breast cancer ([employer]) 03/2018   right breast cancer at 11:00 and 1:00   Bursitis    bilateral hips and knees   Complication of anesthesia    Depression    Family history of breast cancer 03/24/2018   Family history of lung cancer 03/24/2018   History of alcohol dependence ([place]) 2007   no ETOH; resolved since 2007   History of hiatal hernia    History of psychosis 2014   due to sleep disturbance   Hypertension    Parkinson disease ([place])    Personal history of radiation therapy 06/2018-07/2018   right breast ca   PONV (postoperative nausea and vomiting)     Past Surgical History:  Procedure Laterality Date   BREAST BIOPSY Right 03/14/2018   11:00 DCIS and invasive ductal carcinoma   BREAST BIOPSY Right 03/14/2018   1:00 Invasive ductal carcinoma   BREAST LUMPECTOMY Right 04/27/2018   lumpectomy of 11 and 1:00 cancers, clear margins, negative LN   BREAST LUMPECTOMY WITH RADIOACTIVE SEED AND SENTINEL LYMPH NODE BIOPSY Right 04/27/2018   Procedure: RIGHT BREAST LUMPECTOMY WITH RADIOACTIVE SEED X 2 AND RIGHT SENTINEL LYMPH NODE BIOPSY;  Surgeon: [name], MD;  Location: [facility];  Service: General;  Laterality: Right;   CATARACT EXTRACTION W/PHACO Left 08/30/2019   Procedure: CATARACT  EXTRACTION PHACO AND INTRAOCULAR LENS PLACEMENT ([place]) LEFT panoptix toric  01:20.5  12.7%  10.26;  Surgeon: Leandrew Koyanagi, MD;  Location: [facility];  Service: Ophthalmology;  Laterality: Left;   CATARACT EXTRACTION W/PHACO Right 09/20/2019   Procedure: CATARACT EXTRACTION PHACO AND INTRAOCULAR LENS PLACEMENT ([facility]) RIGHT 5.71  00:36.4  15.7%;  Surgeon: Leandrew Koyanagi, MD;  Location: [facility];  Service: Ophthalmology;  Laterality: Right;   LAPAROTOMY N/A 02/10/2018   Procedure: EXPLORATORY LAPAROTOMY;  Surgeon: Isabel Caprice, MD;  [facility];  Service: Gynecology;  Laterality: N/A;   MASS EXCISION  01/2018   abdominal   OVARIAN CYST REMOVAL     SALPINGOOPHORECTOMY Bilateral 02/10/2018   Procedure: BILATERAL SALPINGO OOPHORECTOMY; PERITONEAL WASHINGS;  Surgeon: Isabel Caprice, MD;  [facility];  Service: Gynecology;  Laterality: Bilateral;   TONSILLECTOMY     TONSILLECTOMY AND ADENOIDECTOMY  1953    There were no vitals filed for this visit.   Subjective Assessment - 07/16/21 1347     Subjective "I'm sore today."    Patient is accompained by: Family member   Spouse   Currently in Pain? Yes    Pain Score 10-Worst pain ever    Pain Location Hip    Pain Orientation Right;Left  ADULT SLP TREATMENT - 07/16/21 1348       General Information   Behavior/Cognition Alert;Cooperative    HPI Patient is a 75 y.o. female with history of Parkinson's disease, anxiety, depression, psychosis, HTN. bipolar disorder, alcoholism, memory issues since 2005, hx head trauma. Referred for LSVT BIG and LOUD.      Treatment Provided   Treatment provided Cognitive-Linquistic      Cognitive-Linquistic Treatment   Treatment focused on Dysarthria;Patient/family/caregiver education    Skilled Treatment LSVT Daily tasks with occasional min cues. Loud /a/ averaged 83 dB, duration 13.8 seconds, pitch avg 238 Hz.  Max F0 for pitch glides was  278 Hz (84dB avg), min F0 for low glides was 196 Hz (82 dB avg). Functional phrases averaged 86 dB with occasional min cues for carryover of loudness to spontaneous responses between tasks. Hierarchical loudness drills at phrase level averaged 82 dB, with some vocal fatigue noted in last 15 min of session.       Assessment / Recommendations / Plan   Plan Continue with current plan of care      Progression Toward Goals   Progression toward goals Progressing toward goals              SLP Education - 07/16/21 1348     Education Details carryover: counting for PT in loud voice    Person(s) Educated Patient;Spouse    Methods Explanation    Comprehension Verbalized understanding              SLP Short Term Goals - 07/10/21 1936       SLP SHORT TERM GOAL #1   Title The patient will complete Daily Tasks (Maximum duration "ah", High/Lows, and Functional Phrases) at average loudness >/= 85 dB and with loud, good quality voice with min cues.    Time 10    Period --   sessions   Status New      SLP SHORT TERM GOAL #2   Title The patient will complete Hierarchal Speech Loudness reading drills (words/phrases, sentences) at average >/= 75 dB and with loud, good quality voice with min cues.    Time 10    Period --   sessions   Status New      SLP SHORT TERM GOAL #3   Title The patient will participate in 5-8 minutes conversation, maintaining average loudness of 75 dB and loud, good quality voice with min cues.    Time 10    Period --   sessions   Status New              SLP Long Term Goals - 07/10/21 1937       SLP LONG TERM GOAL #1   Title The patient will complete Daily Tasks (Maximum duration "ah", High/Lows, and Functional Phrases) at average loudness >/= 85 dB and with loud, good quality voice    Time 4    Period Weeks   or 17 sessions, for all LTGs   Status New    Target Date 08/10/21      SLP LONG TERM GOAL #2   Title The patient will complete Hierarchal  Speech Loudness reading drills (words/phrases, sentences, and paragraph) at average >/= 75 dB and with loud, good quality voice.    Time 4    Period Weeks    Status New    Target Date 08/10/21      SLP LONG TERM GOAL #3   Title The patient will participate in 15-20 minutes  conversation, maintaining average loudness of >/= 75 dB and loud, good quality voice.    Time 4    Period Weeks    Status New    Target Date 08/10/21      SLP LONG TERM GOAL #4   Title Patient will report improved communication effectiveness as measured by Communicative Effectiveness Survey.    Baseline 19/32 at eval    Time 4    Period Weeks    Status New    Target Date 08/10/21              Plan - 07/16/21 1349     Clinical Impression Statement Patient presents with mild hypokinetic dysarthria characterized by reduced vocal intensity, short rushes of speech/variable rate, and hoarse vocal quality.Patient was stimulable for improved loudness and vocal quality using intensity-based cues and clinician model; continues to make self-corrections intermittently. Some vocal fatigue in last 15 minutes of session. I recommend skilled ST for LSVT LOUD 4x a week for 4 weeks in order to improve vocal quality, intelligibility, and communicative effectiveness.    Speech Therapy Frequency 4x / week    Duration 4 weeks    Treatment/Interventions Language facilitation;Environmental controls;Cueing hierarchy;SLP instruction and feedback;Compensatory techniques;Functional tasks;Patient/family education;Multimodal communcation approach   LSVT LOUD   Potential to Achieve Goals Good    Potential Considerations Ability to learn/carryover information    SLP Home Exercise Plan to be provided next session    Consulted and Agree with Plan of Care Patient;Family member/caregiver    Family Member Consulted spouse             Patient will benefit from skilled therapeutic intervention in order to improve the following deficits and  impairments:   Dysarthria and anarthria  Parkinson disease ([place])    Problem List Patient Active Problem List   Diagnosis Date Noted   Chronic bilateral low back pain 06/18/2021   Elevated blood pressure reading 05/23/2021   Osteopenia of neck of right femur 09/06/2020   Obesity 08/29/2020   Parkinson disease ([place]) 02/22/2020   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   Bipolar disorder, in full remission, most recent episode depressed ([place]) 11/08/2019   Avitaminosis D 08/24/2019   Insomnia due to mental condition 05/31/2019   Bipolar I disorder, most recent episode depressed ([place]) 05/31/2019   Alcohol use disorder, moderate, in sustained remission ([place]) 05/31/2019   Elevated tumor markers 05/27/2019   GAD (generalized anxiety disorder) 04/10/2019   MDD (major depressive disorder), severe ([place]) 01/31/2019   High serum carbohydrate antigen 19-9 (CA19-9) 12/01/2018   Adjustment disorder with anxious mood 11/24/2018   Essential hypertension 11/24/2018   B12 deficiency 10/17/2018   Incisional hernia, without obstruction or gangrene 09/12/2018   Genetic testing 04/07/2018   Malignant neoplasm of upper-outer quadrant of right breast in female, estrogen receptor positive ([place]) 03/17/2018   Malignant neoplasm of upper-inner quadrant of right breast in female, estrogen receptor positive ([place]) 03/17/2018   History of psychosis 02/07/2018   History of insomnia 02/07/2018   Deneise Lever, [place], CCC-SLP Speech-Language Pathologist  Aliene Altes 07/16/2021, 1:50 PM  [facility] [address] [place], [place], [address] Phone: [phone]   Fax:  [phone]   Name: Kendra [name] MRN: [id number] Date of Birth: 04/30/1946

## 2021-07-16 NOTE — Therapy (Signed)
[place] MAIN [facility] SERVICES [address] [place], [place], [address] Phone: [phone]   Fax:  [phone]  Physical Therapy Treatment  Patient Details  Name: Kendra [name] MRN: [id number] Date of Birth: 12-07-1945 Referring Provider (PT): Arbutus Ped., MD   Encounter Date: 07/16/2021    Past Medical History:  Diagnosis Date   Anxiety    Breast cancer ([place]) 03/2018   right breast cancer at 11:00 and 1:00   Bursitis    bilateral hips and knees   Complication of anesthesia    Depression    Family history of breast cancer 03/24/2018   Family history of lung cancer 03/24/2018   History of alcohol dependence ([place]) 2007   no ETOH; resolved since 2007   History of hiatal hernia    History of psychosis 2014   due to sleep disturbance   Hypertension    Parkinson disease ([place])    Personal history of radiation therapy 06/2018-07/2018   right breast ca   PONV (postoperative nausea and vomiting)     Past Surgical History:  Procedure Laterality Date   BREAST BIOPSY Right 03/14/2018   11:00 DCIS and invasive ductal carcinoma   BREAST BIOPSY Right 03/14/2018   1:00 Invasive ductal carcinoma   BREAST LUMPECTOMY Right 04/27/2018   lumpectomy of 11 and 1:00 cancers, clear margins, negative LN   BREAST LUMPECTOMY WITH RADIOACTIVE SEED AND SENTINEL LYMPH NODE BIOPSY Right 04/27/2018   Procedure: RIGHT BREAST LUMPECTOMY WITH RADIOACTIVE SEED X 2 AND RIGHT SENTINEL LYMPH NODE BIOPSY;  Surgeon: [name], MD;  Location: [place];  Service: General;  Laterality: Right;   CATARACT EXTRACTION W/PHACO Left 08/30/2019   Procedure: CATARACT EXTRACTION PHACO AND INTRAOCULAR LENS PLACEMENT ([place]) LEFT panoptix toric  01:20.5  12.7%  10.26;  Surgeon: Leandrew Koyanagi, MD;  Location: [place];  Service: Ophthalmology;  Laterality: Left;   CATARACT EXTRACTION W/PHACO Right 09/20/2019   Procedure: CATARACT  EXTRACTION PHACO AND INTRAOCULAR LENS PLACEMENT ([facility]) RIGHT 5.71  00:36.4  15.7%;  Surgeon: Leandrew Koyanagi, MD;  Location: [place];  Service: Ophthalmology;  Laterality: Right;   LAPAROTOMY N/A 02/10/2018   Procedure: EXPLORATORY LAPAROTOMY;  Surgeon: Isabel Caprice, MD;  Location: [facility];  Service: Gynecology;  Laterality: N/A;   MASS EXCISION  01/2018   abdominal   OVARIAN CYST REMOVAL     SALPINGOOPHORECTOMY Bilateral 02/10/2018   Procedure: BILATERAL SALPINGO OOPHORECTOMY; PERITONEAL WASHINGS;  Surgeon: Isabel Caprice, MD;  Location: [facility];  Service: Gynecology;  Laterality: Bilateral;   TONSILLECTOMY     TONSILLECTOMY AND ADENOIDECTOMY  1953    There were no vitals filed for this visit.    Subjective Assessment - 07/16/21 1222     Subjective Patient reports success with carryover task.  She is compliant with HEP.  She reports fatigue after last appointment.  Patient reports some pain in her back today.    Patient is accompained by: Family member   spouse, [name]   Pertinent History Pt presents to PT for LSVT BIG evaluation. Pt diagnosed with PD in 2019. Pt reports difficulty with gait speed, freezing, transfers, balance, cognition, and use of hands d/t tremors. Pt has previously attended PT for treatment of back pain. Per chart other PMH includes hx of breast cancer, HTN, MCI, insomnia, cataracts, bipolar 1, alcohol use disorder, GAD, MDD, osteopenia of neck of R femur, chronic B low back pain with radiating pain to buttock and posterior thighs and  calves    Limitations Sitting;Standing;Walking;Reading;Lifting;Writing;House hold activities    How long can you sit comfortably? limited if not well positioned. Feet must be propped up.    How long can you stand comfortably? not comfortable, pain is constant    How long can you walk comfortably? 5 min    Diagnostic tests No recent imaging pertinent to PD per chart. Other most recent imaging per Chancis, Juanda Crumble  note 06/27/2021: 'MRI lumbar spine without contrast from [facility] dated 01/31/2020 imaging and report reviewed today. L5-S1 central disc bulging with severe bilateral facet arthropathy no central or foraminal stenosis. L4-5 grade 1 anterolisthesis with severe bilateral facet arthropathy and ligamentum flavum hypertrophy. She has severe right and moderate to severe left foraminal stenosis. Moderate central stenosis. L3-4 broad-based disc bulging with severe bilateral facet arthropathy and ligamentum flavum hypertrophy. Severe central stenosis. Moderate severe subarticular stenosis. No foraminal stenosis. L2-3 diffuse disc bulging with severe bilateral facet arthropathy and ligamentum flavum hypertrophy. Severe central stenosis and severe bilateral subarticular recess stenosis. Moderate left and mild to moderate right foraminal stenosis. L1-2 broad-based disc bulging with mild bilateral facet arthropathy. No central or foraminal stenosis."    Patient Stated Goals Pt would like to be able to walk faster, get up from a chair more easily    Currently in Pain? Yes    Pain Location Back    Pain Onset More than a month ago    Pain Onset More than a month ago              LSVT BIG Treatment:   Patient seen for LSVT Daily Session Maximal Daily Exercises for facilitation/coordination of movement Sustained movements are designed to rescale the amplitude of movement output for generalization to daily functional activities.   Adapted Maximal Daily Exercises: Floor to Ceiling 8 with 10 second hold  Side to Side 8 Bilateral with 10 second hold Forward Step and Reach 10 Bilateral Sideways Step and Reach 10 Bilateral Backwards Step and Reach 10 Bilateral - modified to Seated Maximal Daily Exercise Forwards Rock and Reach 10 Bilateral Sideways Rock and Reach 10 Bilateral - modified to Seated Maximal Daily exercise   Daily Exercise Comments: Pt still performing exercises 5 and 7 seated. She requires >85-90% cuing  for technique with majority of exercises, and approximately 80-85% cuing with first two maximal daily exercises.  PT providing more hands-on assist today for shaping technique as patient is more independent with exercise sequence.  Rotation is still challenging for patient as his finger abduction and shoulder flexion.  Patient with improved speed of movement for majority of exercises this session.  Functional Component Tasks - performance and progress: Sit to Stand x 5 from standard chair with cuing for big reach, performing modified version where pt holds on to chair with one hand for initial bend/reach 2. Walk and turn 5x 3. Bend and reach 5x 4. Open Jar 5x 5. Step up on step 5x Comments: Patient continues to show improvement with opening a jar and step up on step in particular.  She was able to increase the amplitude of her steps to where she was able to clear initial step of stairs and bring her lower extremity onto second step.  Patient however must hold on while performing this exercise.  Patient was most challenged with sequencing for turning.  Patient bradykinesia also most evident with turns.   Hierarchy Tasks - performance and progress: Going for a walk outside (uneven, varied surfaces); pt ambulates 3 x 36  ft this session and wide circle with close CGA due to postural instability turning.  Continued to practice ambulation for hierarchy task with gait training (see below). 2. Handwriting (signing name/signature to fill forms out) - 5x; patient consistently able to increase size of signature. 3. Ascending/descending stairs; continue to practice step-ups this session (see above for comments).   Gait - performance and progress: close CGA provided throughout d/t postural instability, particularly with turns.  PT continues to provide cueing to maintain big steps with turning. She requires 2 seated rest breaks d/t fatigue. 2x148 ft, TC provided for L side arm-swing.  Patient exhibits more  hypokinesia on left lower extremity, has greater difficulty sustaining large step with left lower extremity.   Carryover Assignment: Pt instructed to stand up from chair after eating lunch and with big effort and big posture.   Homework: Pt to perform all 7 LSVT BIG exercises (adapted and seated for 5 and 7 see above), 5 Functional Component Tasks and big walking.  Instructed patient in safety with adapted and seated maximal daily exercises.  PT reinforced the following safety instructions from prior sessions. P instructed patient on use of upper extremity support with exercises.  PT continues to reinforce education for patient to use at least a cane with ambulation out in the community and around her house.  Patient and spouse verbalized understanding. Updated and instructed pt in carryover task.   Assessment: Pt making gains in physical therapy toward goals, this is indicated by patient and spouse reporting improved "vitality" with movement outside of PT.  It is evident that patient is practicing as she exhibits improved independence with exercise technique as well as improved coordination/sequencing of movement.  Patient with greater ability to override hypokinetic and bradykinetic movement on right side (UE/LE) with gait.  She is still challenged with the left side arm swing and step length.  Patient continues to require rest breaks due to fatigue.  The pt will benefit from further skilled LSVT BIG PT treatments to continue to improve mobility, bradykinesia, hypokinesia, balance and gait to decrease fall risk and improve quality of life.     Pt educated throughout session about proper posture and technique with exercises. Improved exercise technique, movement at target joints, use of target muscles after min to mod verbal, visual, tactile cues.  Note: Portions of this document were prepared using Dragon voice recognition software and although reviewed may contain unintentional dictation errors in  syntax, grammar, or spelling.     Plan - 07/16/21 1219     Clinical Impression Statement Pt making gains in physical therapy toward goals, this is indicated by patient and spouse reporting improved "vitality" with movement outside of PT.  It is evident that patient is practicing as she exhibits improved independence with exercise technique as well as improved coordination/sequencing of movement.  Patient with greater ability to override hypokinetic and bradykinetic movement on right side (UE/LE) with gait.  She is still challenged with the left side arm swing and step length.  Patient continues to require rest breaks due to fatigue.  The pt will benefit from further skilled LSVT BIG PT treatments to continue to improve mobility, bradykinesia, hypokinesia, balance and gait to decrease fall risk and improve quality of life.    Personal Factors and Comorbidities Age;Sex;Fitness;Time since onset of injury/illness/exacerbation;Comorbidity 1;Comorbidity 3+;Comorbidity 2    Comorbidities MCI, HTN, CA, cataracts, bipolar 1, GAD, insomnia, chronic LBP    Examination-Activity Limitations Bend;Sit;Squat;Stairs;Stand;Locomotion Level;Self Feeding;Bathing;Transfers;Lift;Bed Mobility    Examination-Participation Restrictions Cleaning;Community  Activity;Shop;Yard Work;Meal Prep;Personal Finances;Driving    Stability/Clinical Decision Making Evolving/Moderate complexity    Rehab Potential Fair    Clinical Impairments Affecting Rehab Potential MCI, chronic back pain    PT Frequency 4x / week    PT Duration Other (comment)   5 weeks   PT Treatment/Interventions ADLs/Self Care Home Management;Therapeutic exercise;Patient/family education;Neuromuscular re-education;Therapeutic activities;Functional mobility training;Balance training;Gait training;Stair training;Taping;Energy conservation;Passive range of motion;DME Instruction;Cognitive remediation    PT Next Visit Plan Initiate LSVT BIG Maximal daily exercises (see  note for adaptions), functional component tasks, hierarchy tasks, carryover tasks, big gait/walking    PT Home Exercise Plan LSVT 7 adapted maximal daily exercises (exercies 5 and 7 performed with seated versions), 5 functional component tasks, hierarchy tasks, big walking, and carryover task (updated 9/14)    Consulted and Agree with Plan of Care Patient;Family member/caregiver    Family Member Consulted spouse                PT Short Term Goals - 07/10/21 1323       PT SHORT TERM GOAL #1   Title Pt will demonstrate independence with LSVT BIG HEP to improve strength/mobility for improved safety and ease with ADLs and to maintain/continue gains beyond LSVT program.    Baseline 9/7: to be initiated next session    Time 2    Period Weeks    Status New    Target Date 07/23/21               PT Long Term Goals - 07/10/21 1325       PT LONG TERM GOAL #1   Title Patient will increase FOTO score to equal to or greater than 47  to demonstrate statistically significant improvement in mobility and quality of life.    Baseline 9/7: 34    Time 5    Period Weeks    Status New    Target Date 08/13/21      PT LONG TERM GOAL #2   Title Patient (> 30 years old) will complete five times sit to stand test in < 15 seconds indicating decreased fall risk.    Baseline 9/7: 18.5 sec    Time 5    Period Weeks    Status New    Target Date 08/13/21      PT LONG TERM GOAL #3   Title Patient will increase 10 meter walk test to >1.4ms with WFL B step-length for 100% of test to improve ease with gait and to override hypokentic and bradykinetic movement.    Baseline 9/7: 0.74 m/s, decreased B step length and rigidty of movement    Time 5    Period Weeks    Status New    Target Date 08/13/21      PT LONG TERM GOAL #4   Title Patient will reduce timed up and go to <11 seconds to reduce fall risk and demonstrate improved transfer/gait ability and ability to perform turns safely    Baseline  9/7: 17 seconds    Time 5    Period Weeks    Status New    Target Date 08/13/21      PT LONG TERM GOAL #5   Title Pt will demo or report ability to open standard jar using only 1-2 attempts in order to exhibit improved functional mobility for ADLs.    Baseline 9/7: Pt reports she is unable to open jars    Time 5    Period Weeks    Status  New    Target Date 08/13/21                    Patient will benefit from skilled therapeutic intervention in order to improve the following deficits and impairments:     Visit Diagnosis: No diagnosis found.     Problem List Patient Active Problem List   Diagnosis Date Noted   Chronic bilateral low back pain 06/18/2021   Elevated blood pressure reading 05/23/2021   Osteopenia of neck of right femur 09/06/2020   Obesity 08/29/2020   Parkinson disease ([place]) 02/22/2020   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   Bipolar disorder, in full remission, most recent episode depressed ([place]) 11/08/2019   Avitaminosis D 08/24/2019   Insomnia due to mental condition 05/31/2019   Bipolar I disorder, most recent episode depressed ([place]) 05/31/2019   Alcohol use disorder, moderate, in sustained remission ([place]) 05/31/2019   Elevated tumor markers 05/27/2019   GAD (generalized anxiety disorder) 04/10/2019   MDD (major depressive disorder), severe ([place]) 01/31/2019   High serum carbohydrate antigen 19-9 (CA19-9) 12/01/2018   Adjustment disorder with anxious mood 11/24/2018   Essential hypertension 11/24/2018   B12 deficiency 10/17/2018   Incisional hernia, without obstruction or gangrene 09/12/2018   Genetic testing 04/07/2018   Malignant neoplasm of upper-outer quadrant of right breast in female, estrogen receptor positive ([place]) 03/17/2018   Malignant neoplasm of upper-inner quadrant of right breast in female, estrogen receptor positive ([place]) 03/17/2018   History of psychosis 02/07/2018   History of insomnia 02/07/2018    Zollie Pee,  PT 07/16/2021, 11:04 AM  [place], [place], [address] Phone: [phone]   Fax:  [phone]  Name: Alishea Hesch MRN: [id number] Date of Birth: 1946/06/16

## 2021-07-17 ENCOUNTER — Ambulatory Visit: Payer: Medicare PPO

## 2021-07-17 ENCOUNTER — Ambulatory Visit: Payer: Medicare PPO | Admitting: Speech Pathology

## 2021-07-17 DIAGNOSIS — R471 Dysarthria and anarthria: Secondary | ICD-10-CM

## 2021-07-17 DIAGNOSIS — R2689 Other abnormalities of gait and mobility: Secondary | ICD-10-CM

## 2021-07-17 DIAGNOSIS — M79604 Pain in right leg: Secondary | ICD-10-CM | POA: Diagnosis not present

## 2021-07-17 DIAGNOSIS — M79605 Pain in left leg: Secondary | ICD-10-CM | POA: Diagnosis not present

## 2021-07-17 DIAGNOSIS — M6281 Muscle weakness (generalized): Secondary | ICD-10-CM

## 2021-07-17 DIAGNOSIS — G20 Parkinson's disease: Secondary | ICD-10-CM | POA: Diagnosis not present

## 2021-07-17 DIAGNOSIS — R278 Other lack of coordination: Secondary | ICD-10-CM

## 2021-07-17 DIAGNOSIS — R2681 Unsteadiness on feet: Secondary | ICD-10-CM

## 2021-07-17 NOTE — Therapy (Signed)
[facility] [address] [place], [place], [address] Phone: [phone]   Fax:  [phone]  Speech Language Pathology Treatment  Patient Details  Name: Kendra [name] MRN: [id number] Date of Birth: [birth year] Referring Provider (SLP): Jerrol Banana., MD   Encounter Date: 07/17/2021   End of Session - 07/17/21 1251     Visit Number 5    Number of Visits 17    Date for SLP Re-Evaluation 10/07/21    SLP Start Time 1100    SLP Stop Time  1200    SLP Time Calculation (min) 60 min    Activity Tolerance Patient tolerated treatment well             Past Medical History:  Diagnosis Date   Anxiety    Breast cancer ([place]) 03/2018   right breast cancer at 11:00 and 1:00   Bursitis    bilateral hips and knees   Complication of anesthesia    Depression    Family history of breast cancer 03/24/2018   Family history of lung cancer 03/24/2018   History of alcohol dependence ([place]) 2007   no ETOH; resolved since 2007   History of hiatal hernia    History of psychosis 2014   due to sleep disturbance   Hypertension    Parkinson disease ([place])    Personal history of radiation therapy 06/2018-07/2018   right breast ca   PONV (postoperative nausea and vomiting)     Past Surgical History:  Procedure Laterality Date   BREAST BIOPSY Right 03/14/2018   11:00 DCIS and invasive ductal carcinoma   BREAST BIOPSY Right 03/14/2018   1:00 Invasive ductal carcinoma   BREAST LUMPECTOMY Right 04/27/2018   lumpectomy of 11 and 1:00 cancers, clear margins, negative LN   BREAST LUMPECTOMY WITH RADIOACTIVE SEED AND SENTINEL LYMPH NODE BIOPSY Right 04/27/2018   Procedure: RIGHT BREAST LUMPECTOMY WITH RADIOACTIVE SEED X 2 AND RIGHT SENTINEL LYMPH NODE BIOPSY;  Surgeon: [name], MD;  Location: [facility];  Service: General;  Laterality: Right;   CATARACT EXTRACTION W/PHACO Left 08/30/2019   Procedure: CATARACT  EXTRACTION PHACO AND INTRAOCULAR LENS PLACEMENT ([place]) LEFT panoptix toric  01:20.5  12.7%  10.26;  Surgeon: Leandrew Koyanagi, MD;  Location: [facility];  Service: Ophthalmology;  Laterality: Left;   CATARACT EXTRACTION W/PHACO Right 09/20/2019   Procedure: CATARACT EXTRACTION PHACO AND INTRAOCULAR LENS PLACEMENT ([facility]) RIGHT 5.71  00:36.4  15.7%;  Surgeon: Leandrew Koyanagi, MD;  Location: [facility];  Service: Ophthalmology;  Laterality: Right;   LAPAROTOMY N/A 02/10/2018   Procedure: EXPLORATORY LAPAROTOMY;  Surgeon: Isabel Caprice, MD;  Location: [facility];  Service: Gynecology;  Laterality: N/A;   MASS EXCISION  01/2018   abdominal   OVARIAN CYST REMOVAL     SALPINGOOPHORECTOMY Bilateral 02/10/2018   Procedure: BILATERAL SALPINGO OOPHORECTOMY; PERITONEAL WASHINGS;  Surgeon: Isabel Caprice, MD;  Location: [facility];  Service: Gynecology;  Laterality: Bilateral;   TONSILLECTOMY     TONSILLECTOMY AND ADENOIDECTOMY  1953    There were no vitals filed for this visit.   Subjective Assessment - 07/17/21 1250     Subjective "I'm tired."    Patient is accompained by: Family member   spouse   Currently in Pain? No/denies                   ADULT SLP TREATMENT - 07/17/21 1251       General Information  Behavior/Cognition Alert;Cooperative    HPI Patient is a 75 y.o. female with history of Parkinson's disease, anxiety, depression, psychosis, HTN. bipolar disorder, alcoholism, memory issues since 2005, hx head trauma. Referred for LSVT BIG and LOUD.      Treatment Provided   Treatment provided Cognitive-Linquistic      Cognitive-Linquistic Treatment   Treatment focused on Dysarthria;Patient/family/caregiver education    Skilled Treatment LSVT Daily tasks with occasional min cues. Loud /a/ averaged 83 dB, duration 14.5 seconds, pitch avg 248 Hz.  Max F0 for pitch glides was 279 Hz (85 dB avg), min F0 for low glides was 171 Hz (83 dB avg). Functional phrases  averaged 83 dB with min cues. Hierarchical loudness drills at phrase/sentence level averaged 82 dB, with occasional verbal cues for loudness.        Assessment / Recommendations / Plan   Plan Continue with current plan of care      Progression Toward Goals   Progression toward goals Progressing toward goals              SLP Education - 07/17/21 1251     Education Details Parkinson's support groups    Person(s) Educated Patient;Spouse              SLP Short Term Goals - 07/10/21 1936       SLP SHORT TERM GOAL #1   Title The patient will complete Daily Tasks (Maximum duration "ah", High/Lows, and Functional Phrases) at average loudness >/= 85 dB and with loud, good quality voice with min cues.    Time 10    Period --   sessions   Status New      SLP SHORT TERM GOAL #2   Title The patient will complete Hierarchal Speech Loudness reading drills (words/phrases, sentences) at average >/= 75 dB and with loud, good quality voice with min cues.    Time 10    Period --   sessions   Status New      SLP SHORT TERM GOAL #3   Title The patient will participate in 5-8 minutes conversation, maintaining average loudness of 75 dB and loud, good quality voice with min cues.    Time 10    Period --   sessions   Status New              SLP Long Term Goals - 07/10/21 1937       SLP LONG TERM GOAL #1   Title The patient will complete Daily Tasks (Maximum duration "ah", High/Lows, and Functional Phrases) at average loudness >/= 85 dB and with loud, good quality voice    Time 4    Period Weeks   or 17 sessions, for all LTGs   Status New    Target Date 08/10/21      SLP LONG TERM GOAL #2   Title The patient will complete Hierarchal Speech Loudness reading drills (words/phrases, sentences, and paragraph) at average >/= 75 dB and with loud, good quality voice.    Time 4    Period Weeks    Status New    Target Date 08/10/21      SLP LONG TERM GOAL #3   Title The patient will  participate in 15-20 minutes conversation, maintaining average loudness of >/= 75 dB and loud, good quality voice.    Time 4    Period Weeks    Status New    Target Date 08/10/21      SLP LONG TERM GOAL #4  Title Patient will report improved communication effectiveness as measured by Communicative Effectiveness Survey.    Baseline 19/32 at eval    Time 4    Period Weeks    Status New    Target Date 08/10/21              Plan - 07/17/21 1252     Clinical Impression Statement Patient presents with mild hypokinetic dysarthria characterized by reduced vocal intensity, short rushes of speech/variable rate, and hoarse vocal quality.Patient was stimulable for improved loudness and vocal quality using intensity-based cues and clinician model; continues to make self-corrections intermittently. Improving loudness in sentence level tasks, requires frequent cues in conversation. I recommend skilled ST for LSVT LOUD 4x a week for 4 weeks in order to improve vocal quality, intelligibility, and communicative effectiveness.    Speech Therapy Frequency 4x / week    Duration 4 weeks    Treatment/Interventions Language facilitation;Environmental controls;Cueing hierarchy;SLP instruction and feedback;Compensatory techniques;Functional tasks;Patient/family education;Multimodal communcation approach   LSVT LOUD   Potential to Achieve Goals Good    Potential Considerations Ability to learn/carryover information    SLP Home Exercise Plan to be provided next session    Consulted and Agree with Plan of Care Patient;Family member/caregiver    Family Member Consulted spouse             Patient will benefit from skilled therapeutic intervention in order to improve the following deficits and impairments:   Dysarthria and anarthria  Parkinson disease ([place])    Problem List Patient Active Problem List   Diagnosis Date Noted   Chronic bilateral low back pain 06/18/2021   Elevated blood pressure  reading 05/23/2021   Osteopenia of neck of right femur 09/06/2020   Obesity 08/29/2020   Parkinson disease ([place]) 02/22/2020   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   Bipolar disorder, in full remission, most recent episode depressed ([place]) 11/08/2019   Avitaminosis D 08/24/2019   Insomnia due to mental condition 05/31/2019   Bipolar I disorder, most recent episode depressed ([place]) 05/31/2019   Alcohol use disorder, moderate, in sustained remission ([place]) 05/31/2019   Elevated tumor markers 05/27/2019   GAD (generalized anxiety disorder) 04/10/2019   MDD (major depressive disorder), severe ([place]) 01/31/2019   High serum carbohydrate antigen 19-9 (CA19-9) 12/01/2018   Adjustment disorder with anxious mood 11/24/2018   Essential hypertension 11/24/2018   B12 deficiency 10/17/2018   Incisional hernia, without obstruction or gangrene 09/12/2018   Genetic testing 04/07/2018   Malignant neoplasm of upper-outer quadrant of right breast in female, estrogen receptor positive ([place]) 03/17/2018   Malignant neoplasm of upper-inner quadrant of right breast in female, estrogen receptor positive ([place]) 03/17/2018   History of psychosis 02/07/2018   History of insomnia 02/07/2018   Deneise Lever, [place], CCC-SLP Speech-Language Pathologist  Aliene Altes 07/17/2021, 12:53 PM  [facility] [address] [place], [place], [address] Phone: [phone]   Fax:  [phone]   Name: Dila Hopwood MRN: [id number] Date of Birth: 07-10-1946

## 2021-07-17 NOTE — Therapy (Signed)
[place] MAIN [facility] SERVICES [address] [place], [place], [address] Phone: [phone]   Fax:  [phone]  Physical Therapy Treatment  Patient Details  Name: Kendra [name] MRN: [id number] Date of Birth: November 10, 1945 Referring Provider (PT): Arbutus Ped., MD   Encounter Date: 07/17/2021   PT End of Session - 07/17/21 1639     Visit Number 5    Number of Visits 17    Date for PT Re-Evaluation 08/13/21    Authorization Type LSVT BIG    Authorization Time Period Evaluation performed on 07/09/2021    PT Start Time 1006    PT Stop Time 1100    PT Time Calculation (min) 54 min    Equipment Utilized During Treatment Gait belt    Activity Tolerance Patient tolerated treatment well;Patient limited by fatigue;No increased pain    Behavior During Therapy [facility] for tasks assessed/performed             Past Medical History:  Diagnosis Date   Anxiety    Breast cancer ([place]) 03/2018   right breast cancer at 11:00 and 1:00   Bursitis    bilateral hips and knees   Complication of anesthesia    Depression    Family history of breast cancer 03/24/2018   Family history of lung cancer 03/24/2018   History of alcohol dependence ([place]) 2007   no ETOH; resolved since 2007   History of hiatal hernia    History of psychosis 2014   due to sleep disturbance   Hypertension    Parkinson disease ([place])    Personal history of radiation therapy 06/2018-07/2018   right breast ca   PONV (postoperative nausea and vomiting)     Past Surgical History:  Procedure Laterality Date   BREAST BIOPSY Right 03/14/2018   11:00 DCIS and invasive ductal carcinoma   BREAST BIOPSY Right 03/14/2018   1:00 Invasive ductal carcinoma   BREAST LUMPECTOMY Right 04/27/2018   lumpectomy of 11 and 1:00 cancers, clear margins, negative LN   BREAST LUMPECTOMY WITH RADIOACTIVE SEED AND SENTINEL LYMPH NODE BIOPSY Right 04/27/2018   Procedure: RIGHT BREAST LUMPECTOMY WITH  RADIOACTIVE SEED X 2 AND RIGHT SENTINEL LYMPH NODE BIOPSY;  Surgeon: [name], MD;  Location: [place];  Service: General;  Laterality: Right;   CATARACT EXTRACTION W/PHACO Left 08/30/2019   Procedure: CATARACT EXTRACTION PHACO AND INTRAOCULAR LENS PLACEMENT ([place]) LEFT panoptix toric  01:20.5  12.7%  10.26;  Surgeon: Leandrew Koyanagi, MD;  Location: [place];  Service: Ophthalmology;  Laterality: Left;   CATARACT EXTRACTION W/PHACO Right 09/20/2019   Procedure: CATARACT EXTRACTION PHACO AND INTRAOCULAR LENS PLACEMENT ([facility]) RIGHT 5.71  00:36.4  15.7%;  Surgeon: Leandrew Koyanagi, MD;  Location: [place];  Service: Ophthalmology;  Laterality: Right;   LAPAROTOMY N/A 02/10/2018   Procedure: EXPLORATORY LAPAROTOMY;  Surgeon: Isabel Caprice, MD;  Location: [facility];  Service: Gynecology;  Laterality: N/A;   MASS EXCISION  01/2018   abdominal   OVARIAN CYST REMOVAL     SALPINGOOPHORECTOMY Bilateral 02/10/2018   Procedure: BILATERAL SALPINGO OOPHORECTOMY; PERITONEAL WASHINGS;  Surgeon: Isabel Caprice, MD;  Location: [facility];  Service: Gynecology;  Laterality: Bilateral;   TONSILLECTOMY     TONSILLECTOMY AND ADENOIDECTOMY  1953    There were no vitals filed for this visit.   Subjective Assessment - 07/17/21 1007     Subjective Pt reports doing carryover task. Pt reports doing exericses. Pt reports back and  L leg pain currently, and reports she has taken some pain medication for it just prior to PT.    Patient is accompained by: Family member   spouse, [name]   Pertinent History Pt presents to PT for LSVT BIG evaluation. Pt diagnosed with PD in 2019. Pt reports difficulty with gait speed, freezing, transfers, balance, cognition, and use of hands d/t tremors. Pt has previously attended PT for treatment of back pain. Per chart other PMH includes hx of breast cancer, HTN, MCI, insomnia, cataracts, bipolar 1, alcohol use disorder, GAD, MDD, osteopenia  of neck of R femur, chronic B low back pain with radiating pain to buttock and posterior thighs and calves    Limitations Sitting;Standing;Walking;Reading;Lifting;Writing;House hold activities    How long can you sit comfortably? limited if not well positioned. Feet must be propped up.    How long can you stand comfortably? not comfortable, pain is constant    How long can you walk comfortably? 5 min    Diagnostic tests No recent imaging pertinent to PD per chart. Other most recent imaging per Chancis, Juanda Crumble note 06/27/2021: 'MRI lumbar spine without contrast [facility] dated 01/31/2020 imaging and report reviewed today. L5-S1 central disc bulging with severe bilateral facet arthropathy no central or foraminal stenosis. L4-5 grade 1 anterolisthesis with severe bilateral facet arthropathy and ligamentum flavum hypertrophy. She has severe right and moderate to severe left foraminal stenosis. Moderate central stenosis. L3-4 broad-based disc bulging with severe bilateral facet arthropathy and ligamentum flavum hypertrophy. Severe central stenosis. Moderate severe subarticular stenosis. No foraminal stenosis. L2-3 diffuse disc bulging with severe bilateral facet arthropathy and ligamentum flavum hypertrophy. Severe central stenosis and severe bilateral subarticular recess stenosis. Moderate left and mild to moderate right foraminal stenosis. L1-2 broad-based disc bulging with mild bilateral facet arthropathy. No central or foraminal stenosis."    Patient Stated Goals Pt would like to be able to walk faster, get up from a chair more easily    Currently in Pain? Yes    Pain Location Back    Pain Onset More than a month ago    Multiple Pain Sites Yes    Pain Location Leg    Pain Orientation Left    Pain Onset More than a month ago              LSVT BIG Treatment:   Patient seen for LSVT Daily Session Maximal Daily Exercises for facilitation/coordination of movement Sustained movements are  designed to rescale the amplitude of movement output for generalization to daily functional activities.   Adapted Maximal Daily Exercises: Floor to Ceiling 8 with 10 second hold  Side to Side 8 Bilateral with 10 second hold * more difficulty with coordination to L side Forward Step and Reach 10 Bilateral Sideways Step and Reach 10 Bilateral Backwards Step and Reach 10 Bilateral - modified to Seated Maximal Daily Exercise - requires >90% cuing Forwards Rock and Reach 10 Bilateral Sideways Rock and Reach 10 Bilateral - modified to Seated Maximal Daily exercise * difficulty with coordination   Daily Exercise Comments: Pt still performing exercises 5 and 7 seated.  PT providing 80% or greater cueing with majority of maximal Daily exercises as patient continues to exhibit greater independence with technique.  However, the patient requires over 90% cueing for backward step and reach (seated) due to difficulty with coordination and sequencing.  Patient also still has difficulty with coordination required for side to side and for sideways rocking reach but is able to self-correct  more frequently.   Functional Component Tasks - performance and progress: Sit to Stand x 5 from standard chair with cuing for big reach, performing modified version where pt holds on to chair with one hand for initial bend/reach 2. Walk and turn 5x 3. Bend and reach 5x 4. Open Jar 5x *significant improvement pt feels stronger 5. Step up on step 5x Comments: Patient continues to show significant improvement with ability to open a jar and reports success at home as well.  She was able to open the jar this session.  Patient most challenged with bend and reach this session exhibiting bradykinetic movement. PT provides >60% cuing for step up on step for pt to increase amplitude of movement.    Hierarchy Tasks - performance and progress: Going for a walk outside (uneven, varied surfaces); pt ambulates around PT clinic gym for  approx 148 ft with emphasis on turning BIG.  2. Handwriting (signing name/signature to fill forms out) - 5x; patient continues to consisently write big.  3. Ascending/descending stairs; continue to practice step-ups this session (see above for comments).   Gait - performance and progress: close CGA provided throughout d/t postural instability, particularly with turns.  PT continues to provide cueing to maintain big steps with turning.  Patient ambulates 2x148 ft (first lap part of hierarchy task as well) continued TC provided for L side arm-swing.  Patient still challenged with fatigue requiring rest breaks.    Carryover Assignment: Pt instructed to wave big when greeting SLP for her next appointment.    Homework: Pt to perform all 7 LSVT BIG exercises (adapted and seated for 5 and 7 see above), 5 Functional Component Tasks and big walking.  Reinforced patient in safety with adapted and seated maximal daily exercises.  PT continues to reinforce the following safety instructions from prior sessions. PT instructed patient on use of upper extremity support with exercises.  PT continues to instruct patient to use at least a cane with ambulation out in the community and around her house as pt still not using one.  Patient and spouse verbalized understanding. Updated and instructed pt in carryover task.   Assessment: Pt highly motivated throughout session. She continues to improve independence with maximal daily exercise technique. She shows success in PT session and similarly reports success outside of PT with opening a jar using BIG cue/movement. Pt continues to be challenged with fatigue and coordination with backwards step and reach and sideways step and reach. The pt will benefit from further skilled LSVT BIG PT treatments to continue to improve mobility, bradykinesia, hypokinesia, balance and gait to decrease fall risk and improve quality of life.      Pt educated throughout session about proper  posture and technique with exercises. Improved exercise technique, movement at target joints, use of target muscles after min to mod verbal, visual, tactile cues.   Note: Portions of this document were prepared using Dragon voice recognition software and although reviewed may contain unintentional dictation errors in syntax, grammar, or spelling.          PT Education - 07/17/21 1638     Education provided Yes    Education Details Continued education for exercise technique, body mechanics with maximal daily exercises, functional component tasks, hierarchy tasks.    Person(s) Educated Patient    Methods Explanation;Demonstration;Tactile cues;Verbal cues;Handout    Comprehension Verbalized understanding;Returned demonstration;Verbal cues required;Tactile cues required;Need further instruction              PT Short Term  Goals - 07/10/21 1323       PT SHORT TERM GOAL #1   Title Pt will demonstrate independence with LSVT BIG HEP to improve strength/mobility for improved safety and ease with ADLs and to maintain/continue gains beyond LSVT program.    Baseline 9/7: to be initiated next session    Time 2    Period Weeks    Status New    Target Date 07/23/21               PT Long Term Goals - 07/10/21 1325       PT LONG TERM GOAL #1   Title Patient will increase FOTO score to equal to or greater than 47  to demonstrate statistically significant improvement in mobility and quality of life.    Baseline 9/7: 34    Time 5    Period Weeks    Status New    Target Date 08/13/21      PT LONG TERM GOAL #2   Title Patient (> 41 years old) will complete five times sit to stand test in < 15 seconds indicating decreased fall risk.    Baseline 9/7: 18.5 sec    Time 5    Period Weeks    Status New    Target Date 08/13/21      PT LONG TERM GOAL #3   Title Patient will increase 10 meter walk test to >1.41ms with WFL B step-length for 100% of test to improve ease with gait and to  override hypokentic and bradykinetic movement.    Baseline 9/7: 0.74 m/s, decreased B step length and rigidty of movement    Time 5    Period Weeks    Status New    Target Date 08/13/21      PT LONG TERM GOAL #4   Title Patient will reduce timed up and go to <11 seconds to reduce fall risk and demonstrate improved transfer/gait ability and ability to perform turns safely    Baseline 9/7: 17 seconds    Time 5    Period Weeks    Status New    Target Date 08/13/21      PT LONG TERM GOAL #5   Title Pt will demo or report ability to open standard jar using only 1-2 attempts in order to exhibit improved functional mobility for ADLs.    Baseline 9/7: Pt reports she is unable to open jars    Time 5    Period Weeks    Status New    Target Date 08/13/21                   Plan - 07/17/21 1640     Clinical Impression Statement Pt highly motivated throughout session. She continues to improve independence with maximal daily exercise technique. She shows success in PT session and similarly reports success outside of PT with opening a jar using BIG cue/movement. Pt continues to be challenged with fatigue and coordination with backwards step and reach and sideways step and reach. The pt will benefit from further skilled LSVT BIG PT treatments to continue to improve mobility, bradykinesia, hypokinesia, balance and gait to decrease fall risk and improve quality of life.    Personal Factors and Comorbidities Age;Sex;Fitness;Time since onset of injury/illness/exacerbation;Comorbidity 1;Comorbidity 3+;Comorbidity 2    Comorbidities MCI, HTN, CA, cataracts, bipolar 1, GAD, insomnia, chronic LBP    Examination-Activity Limitations Bend;Sit;Squat;Stairs;Stand;Locomotion Level;Self Feeding;Bathing;Transfers;Lift;Bed Mobility    Examination-Participation Restrictions Cleaning;Community Activity;Shop;Yard Work;Meal Prep;Personal Finances;Driving  Stability/Clinical Decision Making Evolving/Moderate  complexity    Rehab Potential Fair    Clinical Impairments Affecting Rehab Potential MCI, chronic back pain    PT Frequency 4x / week    PT Duration Other (comment)   5 weeks   PT Treatment/Interventions ADLs/Self Care Home Management;Therapeutic exercise;Patient/family education;Neuromuscular re-education;Therapeutic activities;Functional mobility training;Balance training;Gait training;Stair training;Taping;Energy conservation;Passive range of motion;DME Instruction;Cognitive remediation    PT Next Visit Plan Initiate LSVT BIG Maximal daily exercises (see note for adaptions), functional component tasks, hierarchy tasks, carryover tasks, big gait/walking    PT Home Exercise Plan LSVT 7 adapted maximal daily exercises (exercies 5 and 7 performed with seated versions), 5 functional component tasks, hierarchy tasks, big walking, and carryover task (updated 9/15)    Consulted and Agree with Plan of Care Patient;Family member/caregiver    Family Member Consulted spouse             Patient will benefit from skilled therapeutic intervention in order to improve the following deficits and impairments:  Abnormal gait, Decreased activity tolerance, Decreased cognition, Decreased endurance, Decreased knowledge of use of DME, Decreased range of motion, Decreased strength, Hypomobility, Dizziness, Pain, Improper body mechanics, Impaired UE functional use, Decreased balance, Decreased coordination, Decreased mobility, Difficulty walking, Increased edema, Impaired flexibility, Increased muscle spasms, Postural dysfunction  Visit Diagnosis: Other abnormalities of gait and mobility  Other lack of coordination  Muscle weakness (generalized)  Unsteadiness on feet  Parkinson disease ([place])     Problem List Patient Active Problem List   Diagnosis Date Noted   Chronic bilateral low back pain 06/18/2021   Elevated blood pressure reading 05/23/2021   Osteopenia of neck of right femur 09/06/2020    Obesity 08/29/2020   Parkinson disease ([place]) 02/22/2020   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   Bipolar disorder, in full remission, most recent episode depressed ([place]) 11/08/2019   Avitaminosis D 08/24/2019   Insomnia due to mental condition 05/31/2019   Bipolar I disorder, most recent episode depressed ([place]) 05/31/2019   Alcohol use disorder, moderate, in sustained remission ([place]) 05/31/2019   Elevated tumor markers 05/27/2019   GAD (generalized anxiety disorder) 04/10/2019   MDD (major depressive disorder), severe ([place]) 01/31/2019   High serum carbohydrate antigen 19-9 (CA19-9) 12/01/2018   Adjustment disorder with anxious mood 11/24/2018   Essential hypertension 11/24/2018   B12 deficiency 10/17/2018   Incisional hernia, without obstruction or gangrene 09/12/2018   Genetic testing 04/07/2018   Malignant neoplasm of upper-outer quadrant of right breast in female, estrogen receptor positive ([place]) 03/17/2018   Malignant neoplasm of upper-inner quadrant of right breast in female, estrogen receptor positive ([place]) 03/17/2018   History of psychosis 02/07/2018   History of insomnia 02/07/2018    Zollie Pee, PT 07/17/2021, 4:56 PM  [place] MAIN [facility] SERVICES [address] [place], [place], [address] Phone: [phone]   Fax:  [phone]  Name: Kendra [name] MRN: [id number] Date of Birth: 23-Mar-1946

## 2021-07-18 DIAGNOSIS — M48062 Spinal stenosis, lumbar region with neurogenic claudication: Secondary | ICD-10-CM | POA: Diagnosis not present

## 2021-07-18 DIAGNOSIS — M5416 Radiculopathy, lumbar region: Secondary | ICD-10-CM | POA: Diagnosis not present

## 2021-07-18 DIAGNOSIS — M5136 Other intervertebral disc degeneration, lumbar region: Secondary | ICD-10-CM | POA: Diagnosis not present

## 2021-07-21 ENCOUNTER — Ambulatory Visit: Payer: Medicare PPO

## 2021-07-21 ENCOUNTER — Encounter: Payer: Medicare PPO | Admitting: Physical Therapy

## 2021-07-21 ENCOUNTER — Other Ambulatory Visit: Payer: Self-pay

## 2021-07-21 ENCOUNTER — Ambulatory Visit: Payer: Medicare PPO | Admitting: Speech Pathology

## 2021-07-21 DIAGNOSIS — R2681 Unsteadiness on feet: Secondary | ICD-10-CM | POA: Diagnosis not present

## 2021-07-21 DIAGNOSIS — R278 Other lack of coordination: Secondary | ICD-10-CM | POA: Diagnosis not present

## 2021-07-21 DIAGNOSIS — G20 Parkinson's disease: Secondary | ICD-10-CM

## 2021-07-21 DIAGNOSIS — M6281 Muscle weakness (generalized): Secondary | ICD-10-CM | POA: Diagnosis not present

## 2021-07-21 DIAGNOSIS — M79604 Pain in right leg: Secondary | ICD-10-CM | POA: Diagnosis not present

## 2021-07-21 DIAGNOSIS — R2689 Other abnormalities of gait and mobility: Secondary | ICD-10-CM | POA: Diagnosis not present

## 2021-07-21 DIAGNOSIS — M79605 Pain in left leg: Secondary | ICD-10-CM | POA: Diagnosis not present

## 2021-07-21 DIAGNOSIS — R471 Dysarthria and anarthria: Secondary | ICD-10-CM

## 2021-07-21 NOTE — Therapy (Signed)
[place] MAIN [facility] SERVICES [address] [place], [place], [address] Phone: [phone]   Fax:  [phone]  Speech Language Pathology Treatment  Patient Details  Name: Kendra [name] MRN: [id number] Date of Birth: [birth year] Referring Provider (SLP): Jerrol Banana., MD   Encounter Date: 07/21/2021   End of Session - 07/21/21 1106     Visit Number 6    Number of Visits 17    Date for SLP Re-Evaluation 10/07/21    SLP Start Time 1006   arrived late   SLP Stop Time  8    SLP Time Calculation (min) 55 min    Activity Tolerance Patient tolerated treatment well             Past Medical History:  Diagnosis Date   Anxiety    Breast cancer ([place]) 03/2018   right breast cancer at 11:00 and 1:00   Bursitis    bilateral hips and knees   Complication of anesthesia    Depression    Family history of breast cancer 03/24/2018   Family history of lung cancer 03/24/2018   History of alcohol dependence ([place]) 2007   no ETOH; resolved since 2007   History of hiatal hernia    History of psychosis 2014   due to sleep disturbance   Hypertension    Parkinson disease ([place])    Personal history of radiation therapy 06/2018-07/2018   right breast ca   PONV (postoperative nausea and vomiting)     Past Surgical History:  Procedure Laterality Date   BREAST BIOPSY Right 03/14/2018   11:00 DCIS and invasive ductal carcinoma   BREAST BIOPSY Right 03/14/2018   1:00 Invasive ductal carcinoma   BREAST LUMPECTOMY Right 04/27/2018   lumpectomy of 11 and 1:00 cancers, clear margins, negative LN   BREAST LUMPECTOMY WITH RADIOACTIVE SEED AND SENTINEL LYMPH NODE BIOPSY Right 04/27/2018   Procedure: RIGHT BREAST LUMPECTOMY WITH RADIOACTIVE SEED X 2 AND RIGHT SENTINEL LYMPH NODE BIOPSY;  Surgeon: [name], MD;  Location: [place];  Service: General;  Laterality: Right;   CATARACT EXTRACTION W/PHACO Left 08/30/2019    Procedure: CATARACT EXTRACTION PHACO AND INTRAOCULAR LENS PLACEMENT ([place]) LEFT panoptix toric  01:20.5  12.7%  10.26;  Surgeon: Leandrew Koyanagi, MD;  Location: [place];  Service: Ophthalmology;  Laterality: Left;   CATARACT EXTRACTION W/PHACO Right 09/20/2019   Procedure: CATARACT EXTRACTION PHACO AND INTRAOCULAR LENS PLACEMENT ([facility]) RIGHT 5.71  00:36.4  15.7%;  Surgeon: Leandrew Koyanagi, MD;  Location: [place];  Service: Ophthalmology;  Laterality: Right;   LAPAROTOMY N/A 02/10/2018   Procedure: EXPLORATORY LAPAROTOMY;  Surgeon: Isabel Caprice, MD;  Location: [facility];  Service: Gynecology;  Laterality: N/A;   MASS EXCISION  01/2018   abdominal   OVARIAN CYST REMOVAL     SALPINGOOPHORECTOMY Bilateral 02/10/2018   Procedure: BILATERAL SALPINGO OOPHORECTOMY; PERITONEAL WASHINGS;  Surgeon: Isabel Caprice, MD;  Location: [facility];  Service: Gynecology;  Laterality: Bilateral;   TONSILLECTOMY     TONSILLECTOMY AND ADENOIDECTOMY  1953    There were no vitals filed for this visit.   Subjective Assessment - 07/21/21 1105     Subjective "If I practice after 6 it keeps me awake."    Patient is accompained by: Family member   spouse   Currently in Pain? No/denies                   ADULT SLP TREATMENT - 07/21/21  [place]   Behavior/Cognition Alert;Cooperative    HPI Patient is a 75 y.o. female with history of Parkinson's disease, anxiety, depression, psychosis, HTN. bipolar disorder, alcoholism, memory issues since 2005, hx head trauma. Referred for LSVT BIG and LOUD.      Treatment Provided   Treatment provided Cognitive-Linquistic      Pain Assessment   Pain Assessment No/denies pain      Cognitive-Linquistic Treatment   Treatment focused on Dysarthria;Patient/family/caregiver education    Skilled Treatment LSVT Daily tasks with occasional min cues for loudness, pitch, duration. Loud /a/ averaged 83 dB, duration 14.9 seconds,  pitch avg 227 Hz.  Max F0 for pitch glides was 285 Hz (84dB avg), min F0 for low glides was 169 Hz (83 dB avg). Functional phrases averaged 85 dB with modified independence. Hierarchical loudness drills at sentence level averaged 83 dB, with added cognitive load, average decreased to 79 dB with more frequent cues for loudness.        Assessment / Recommendations / Plan   Plan Continue with current plan of care      Progression Toward Goals   Progression toward goals Progressing toward goals              SLP Education - 07/21/21 1106     Education Details ok to practice earlier in the day    Person(s) Educated Patient    Methods Explanation    Comprehension Verbalized understanding              SLP Short Term Goals - 07/10/21 1936       SLP SHORT TERM GOAL #1   Title The patient will complete Daily Tasks (Maximum duration "ah", High/Lows, and Functional Phrases) at average loudness >/= 85 dB and with loud, good quality voice with min cues.    Time 10    Period --   sessions   Status New      SLP SHORT TERM GOAL #2   Title The patient will complete Hierarchal Speech Loudness reading drills (words/phrases, sentences) at average >/= 75 dB and with loud, good quality voice with min cues.    Time 10    Period --   sessions   Status New      SLP SHORT TERM GOAL #3   Title The patient will participate in 5-8 minutes conversation, maintaining average loudness of 75 dB and loud, good quality voice with min cues.    Time 10    Period --   sessions   Status New              SLP Long Term Goals - 07/10/21 1937       SLP LONG TERM GOAL #1   Title The patient will complete Daily Tasks (Maximum duration "ah", High/Lows, and Functional Phrases) at average loudness >/= 85 dB and with loud, good quality voice    Time 4    Period Weeks   or 17 sessions, for all LTGs   Status New    Target Date 08/10/21      SLP LONG TERM GOAL #2   Title The patient will complete  Hierarchal Speech Loudness reading drills (words/phrases, sentences, and paragraph) at average >/= 75 dB and with loud, good quality voice.    Time 4    Period Weeks    Status New    Target Date 08/10/21      SLP LONG TERM GOAL #3   Title  The patient will participate in 15-20 minutes conversation, maintaining average loudness of >/= 75 dB and loud, good quality voice.    Time 4    Period Weeks    Status New    Target Date 08/10/21      SLP LONG TERM GOAL #4   Title Patient will report improved communication effectiveness as measured by Communicative Effectiveness Survey.    Baseline 19/32 at eval    Time 4    Period Weeks    Status New    Target Date 08/10/21              Plan - 07/21/21 1106     Clinical Impression Statement Patient presents with mild hypokinetic dysarthria characterized by reduced vocal intensity, short rushes of speech/variable rate, and hoarse vocal quality.Patient was stimulable for improved loudness and vocal quality using intensity-based cues and clinician model; continues to make self-corrections intermittently. Improving loudness in sentence level tasks, requires frequent cues in conversation. I recommend skilled ST for LSVT LOUD 4x a week for 4 weeks in order to improve vocal quality, intelligibility, and communicative effectiveness.    Speech Therapy Frequency 4x / week    Duration 4 weeks    Treatment/Interventions Language facilitation;Environmental controls;Cueing hierarchy;SLP instruction and feedback;Compensatory techniques;Functional tasks;Patient/family education;Multimodal communcation approach   LSVT LOUD   Potential to Achieve Goals Good    Potential Considerations Ability to learn/carryover information    SLP Home Exercise Plan to be provided next session    Consulted and Agree with Plan of Care Patient;Family member/caregiver    Family Member Consulted spouse             Patient will benefit from skilled therapeutic intervention in  order to improve the following deficits and impairments:   Dysarthria and anarthria  Parkinson disease ([place])    Problem List Patient Active Problem List   Diagnosis Date Noted   Chronic bilateral low back pain 06/18/2021   Elevated blood pressure reading 05/23/2021   Osteopenia of neck of right femur 09/06/2020   Obesity 08/29/2020   Parkinson disease ([place]) 02/22/2020   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   Bipolar disorder, in full remission, most recent episode depressed ([place]) 11/08/2019   Avitaminosis D 08/24/2019   Insomnia due to mental condition 05/31/2019   Bipolar I disorder, most recent episode depressed ([place]) 05/31/2019   Alcohol use disorder, moderate, in sustained remission ([place]) 05/31/2019   Elevated tumor markers 05/27/2019   GAD (generalized anxiety disorder) 04/10/2019   MDD (major depressive disorder), severe ([place]) 01/31/2019   High serum carbohydrate antigen 19-9 (CA19-9) 12/01/2018   Adjustment disorder with anxious mood 11/24/2018   Essential hypertension 11/24/2018   B12 deficiency 10/17/2018   Incisional hernia, without obstruction or gangrene 09/12/2018   Genetic testing 04/07/2018   Malignant neoplasm of upper-outer quadrant of right breast in female, estrogen receptor positive ([place]) 03/17/2018   Malignant neoplasm of upper-inner quadrant of right breast in female, estrogen receptor positive ([place]) 03/17/2018   History of psychosis 02/07/2018   History of insomnia 02/07/2018   Deneise Lever, [place], CCC-SLP Speech-Language Pathologist  Aliene Altes 07/21/2021, 11:07 AM  [place] [address] [place], [place], [address] Phone: [phone]   Fax:  [phone]   Name: Keonda Dow MRN: [id number] Date of Birth: 20-Sep-1946

## 2021-07-21 NOTE — Therapy (Signed)
[place] MAIN [facility] SERVICES [address] [place], [place], [address] Phone: [phone]   Fax:  [phone]  Physical Therapy Treatment  Patient Details  Name: Kendra [name] MRN: [id number] Date of Birth: July 08, 1946 Referring Provider (PT): Arbutus Ped., MD   Encounter Date: 07/21/2021   PT End of Session - 07/21/21 1209     Visit Number 6    Number of Visits 17    Date for PT Re-Evaluation 08/13/21    Authorization Type LSVT BIG    Authorization Time Period Evaluation performed on 07/09/2021    PT Start Time 1102    PT Stop Time 1201    PT Time Calculation (min) 59 min    Equipment Utilized During Treatment Gait belt    Activity Tolerance Patient tolerated treatment well;Patient limited by fatigue;No increased pain    Behavior During Therapy [facility] for tasks assessed/performed             Past Medical History:  Diagnosis Date   Anxiety    Breast cancer ([place]) 03/2018   right breast cancer at 11:00 and 1:00   Bursitis    bilateral hips and knees   Complication of anesthesia    Depression    Family history of breast cancer 03/24/2018   Family history of lung cancer 03/24/2018   History of alcohol dependence ([place]) 2007   no ETOH; resolved since 2007   History of hiatal hernia    History of psychosis 2014   due to sleep disturbance   Hypertension    Parkinson disease ([place])    Personal history of radiation therapy 06/2018-07/2018   right breast ca   PONV (postoperative nausea and vomiting)     Past Surgical History:  Procedure Laterality Date   BREAST BIOPSY Right 03/14/2018   11:00 DCIS and invasive ductal carcinoma   BREAST BIOPSY Right 03/14/2018   1:00 Invasive ductal carcinoma   BREAST LUMPECTOMY Right 04/27/2018   lumpectomy of 11 and 1:00 cancers, clear margins, negative LN   BREAST LUMPECTOMY WITH RADIOACTIVE SEED AND SENTINEL LYMPH NODE BIOPSY Right 04/27/2018   Procedure: RIGHT BREAST LUMPECTOMY WITH  RADIOACTIVE SEED X 2 AND RIGHT SENTINEL LYMPH NODE BIOPSY;  Surgeon: [name], MD;  Location: [place];  Service: General;  Laterality: Right;   CATARACT EXTRACTION W/PHACO Left 08/30/2019   Procedure: CATARACT EXTRACTION PHACO AND INTRAOCULAR LENS PLACEMENT (Kendra [name]) LEFT panoptix toric  01:20.5  12.7%  10.26;  Surgeon: Leandrew Koyanagi, MD;  Location: [place];  Service: Ophthalmology;  Laterality: Left;   CATARACT EXTRACTION W/PHACO Right 09/20/2019   Procedure: CATARACT EXTRACTION PHACO AND INTRAOCULAR LENS PLACEMENT ([facility]) RIGHT 5.71  00:36.4  15.7%;  Surgeon: Leandrew Koyanagi, MD;  Location: [place];  Service: Ophthalmology;  Laterality: Right;   LAPAROTOMY N/A 02/10/2018   Procedure: EXPLORATORY LAPAROTOMY;  Surgeon: Isabel Caprice, MD;  Location: [facility];  Service: Gynecology;  Laterality: N/A;   MASS EXCISION  01/2018   abdominal   OVARIAN CYST REMOVAL     SALPINGOOPHORECTOMY Bilateral 02/10/2018   Procedure: BILATERAL SALPINGO OOPHORECTOMY; PERITONEAL WASHINGS;  Surgeon: Isabel Caprice, MD;  Location: [facility];  Service: Gynecology;  Laterality: Bilateral;   TONSILLECTOMY     TONSILLECTOMY AND ADENOIDECTOMY  1953    There were no vitals filed for this visit.   Subjective Assessment - 07/21/21 1103     Subjective Pt reports compliance with HEP. She reports 8/10 BLE pain and that she  just took pain medication.    Patient is accompained by: Family member   spouse, [name]   Pertinent History Pt presents to PT for LSVT BIG evaluation. Pt diagnosed with PD in 2019. Pt reports difficulty with gait speed, freezing, transfers, balance, cognition, and use of hands d/t tremors. Pt has previously attended PT for treatment of back pain. Per chart other PMH includes hx of breast cancer, HTN, MCI, insomnia, cataracts, bipolar 1, alcohol use disorder, GAD, MDD, osteopenia of neck of R femur, chronic B low back pain with radiating pain to buttock and  posterior thighs and calves    Limitations Sitting;Standing;Walking;Reading;Lifting;Writing;House hold activities    How long can you sit comfortably? limited if not well positioned. Feet must be propped up.    How long can you stand comfortably? not comfortable, pain is constant    How long can you walk comfortably? 5 min    Diagnostic tests No recent imaging pertinent to PD per chart. Other most recent imaging per Chancis, Juanda Crumble note 06/27/2021: 'MRI lumbar spine without contrast [facility] dated 01/31/2020 imaging and report reviewed today. L5-S1 central disc bulging with severe bilateral facet arthropathy no central or foraminal stenosis. L4-5 grade 1 anterolisthesis with severe bilateral facet arthropathy and ligamentum flavum hypertrophy. She has severe right and moderate to severe left foraminal stenosis. Moderate central stenosis. L3-4 broad-based disc bulging with severe bilateral facet arthropathy and ligamentum flavum hypertrophy. Severe central stenosis. Moderate severe subarticular stenosis. No foraminal stenosis. L2-3 diffuse disc bulging with severe bilateral facet arthropathy and ligamentum flavum hypertrophy. Severe central stenosis and severe bilateral subarticular recess stenosis. Moderate left and mild to moderate right foraminal stenosis. L1-2 broad-based disc bulging with mild bilateral facet arthropathy. No central or foraminal stenosis."    Patient Stated Goals Pt would like to be able to walk faster, get up from a chair more easily    Currently in Pain? Yes    Pain Score 8     Pain Location Leg    Pain Orientation Right;Left    Pain Onset More than a month ago    Pain Onset More than a month ago            LSVT BIG Treatment:   Patient seen for LSVT Daily Session Maximal Daily Exercises for facilitation/coordination of movement Sustained movements are designed to rescale the amplitude of movement output for generalization to daily functional activities.    Adapted Maximal Daily Exercises: Floor to Ceiling 10 with 10 second hold; frequent cuing to LUE for abduction; pt rates hard  Side to Side 10 Bilateral with 10 second hold  Forward Step and Reach 12 Bilateral Sideways Step and Reach 10 Bilateral Backwards Step and Reach 10 Bilateral - modified to Seated Maximal Daily Exercise - requires >85% cuing d/t difficulty with coordination  Forwards Rock and Reach 10 Bilateral Sideways Rock and Reach 10 Bilateral - modified to Seated Maximal Daily exercise * continued difficulty with coordination   Daily Exercise Comments: Pt still performing exercises 5 and 7 seated.  PT continues to provide 80% or greater cueing with majority of maximal Daily exercises.  Patient require less cueing today with backwards seated step and reach compared to prior session.  Coordination with backward step and reach and sideways rock and reach are still challenging for patient, however patient was able to lead PT through some reps independently.     Functional Component Tasks - performance and progress: Sit to Stand x 5 from standard chair with cuing  for big reach, performing modified version where pt holds on to chair with one hand for initial bend/reach 2. Walk and turn 5x 3. Bend and reach 5x 4. Open Jar 5x *able to fully open jar after 2 reps 5. Step up on step 5x Comments: Patient consistently able to open jar 2 big turns.  Patient still with some bradykinesia with a few reps of bend and reach.  Patient consistently uses big amplitude for step ups on step.   Hierarchy Tasks - performance and progress: Contact guard assist provided throughout unless otherwise noted for all upright tasks Going for a walk outside (uneven, varied surfaces); pt ambulates around PT clinic gym and must navigate over and on compliant surface as well as ambulate on  firm surface; 2 instances of requiring up to min assist from PT due to postural instability 2. Handwriting - signing name with  addition of the day's date BIG; patient consistently writes big 3. Ascending/descending stairs; big walk up to stairs with big reach to handrails and big step up onto first step; some initial difficulty with sequencing after which patient was able to display correct technique   Gait - performance and progress: Continued use of close CGA provided throughout d/t postural instability, particularly with turns.  PT continues to provide cueing to maintain big steps and armswing with turning.  Patient ambulates 2x148 ft (incorporated into hierarchy task as well).  PT cues patient to reach back to touch PTs hand to promote left shoulder extension with arm swing.  Patient uses 2 rest breaks due to fatigue.   Carryover Assignment: Pt instructed to close car door with big posture and big effort after stepping out of car at home.  Homework: Pt to perform all 7 LSVT BIG exercises (adapted and seated for 5 and 7 see above), 5 Functional Component Tasks and big walking.  Reinforced patient in safety with adapted and seated maximal daily exercises.  PT continues to reinforce the following safety instructions from prior sessions. PT instructed patient on use of upper extremity support with exercises.  Patient and spouse verbalized understanding. Updated and instructed pt in carryover task.    Pt educated throughout session about proper posture and technique with exercises. Improved exercise technique, movement at target joints, use of target muscles after min to mod verbal, visual, tactile cues.   Note: Portions of this document were prepared using Dragon voice recognition software and although reviewed may contain unintentional dictation errors in syntax, grammar, or spelling.       PT Education - 07/21/21 1208     Education provided Yes    Education Details Exercise technique, body mechanics with all interventions performed.    Person(s) Educated Patient    Methods Explanation;Demonstration;Tactile  cues;Verbal cues;Handout    Comprehension Verbalized understanding;Returned demonstration;Verbal cues required;Tactile cues required;Need further instruction              PT Short Term Goals - 07/10/21 1323       PT SHORT TERM GOAL #1   Title Pt will demonstrate independence with LSVT BIG HEP to improve strength/mobility for improved safety and ease with ADLs and to maintain/continue gains beyond LSVT program.    Baseline 9/7: to be initiated next session    Time 2    Period Weeks    Status New    Target Date 07/23/21               PT Long Term Goals - 07/10/21 1325  PT LONG TERM GOAL #1   Title Patient will increase FOTO score to equal to or greater than 47  to demonstrate statistically significant improvement in mobility and quality of life.    Baseline 9/7: 34    Time 5    Period Weeks    Status New    Target Date 08/13/21      PT LONG TERM GOAL #2   Title Patient (> 61 years old) will complete five times sit to stand test in < 15 seconds indicating decreased fall risk.    Baseline 9/7: 18.5 sec    Time 5    Period Weeks    Status New    Target Date 08/13/21      PT LONG TERM GOAL #3   Title Patient will increase 10 meter walk test to >1.13ms with WFL B step-length for 100% of test to improve ease with gait and to override hypokentic and bradykinetic movement.    Baseline 9/7: 0.74 m/s, decreased B step length and rigidty of movement    Time 5    Period Weeks    Status New    Target Date 08/13/21      PT LONG TERM GOAL #4   Title Patient will reduce timed up and go to <11 seconds to reduce fall risk and demonstrate improved transfer/gait ability and ability to perform turns safely    Baseline 9/7: 17 seconds    Time 5    Period Weeks    Status New    Target Date 08/13/21      PT LONG TERM GOAL #5   Title Pt will demo or report ability to open standard jar using only 1-2 attempts in order to exhibit improved functional mobility for ADLs.     Baseline 9/7: Pt reports she is unable to open jars    Time 5    Period Weeks    Status New    Target Date 08/13/21                   Plan - 07/21/21 1205     Clinical Impression Statement Patient able to tolerate increased repetitions with maximal daily sustained and standing maximal Daily exercise (exercise 3), indicating improved activity tolerance.  Patient requires only 10% cueing with forward step and reach and forward rocking reach.  While patient has improved coordination with backwards seated step and reach, the coordination is still very challenging for patient and PT provides frequent cueing. Pt utilized TC to promote LUE armswing with gait, afterwhich pt improved technique.  The patient will benefit from further skilled PT to continue to improve general mobility, balance, strength and gait in order to override bradykinesia and hypokinesia.    Personal Factors and Comorbidities Age;Sex;Fitness;Time since onset of injury/illness/exacerbation;Comorbidity 1;Comorbidity 3+;Comorbidity 2    Comorbidities MCI, HTN, CA, cataracts, bipolar 1, GAD, insomnia, chronic LBP    Examination-Activity Limitations Bend;Sit;Squat;Stairs;Stand;Locomotion Level;Self Feeding;Bathing;Transfers;Lift;Bed Mobility    Examination-Participation Restrictions Cleaning;Community Activity;Shop;Yard Work;Meal Prep;Personal Finances;Driving    Stability/Clinical Decision Making Evolving/Moderate complexity    Rehab Potential Fair    Clinical Impairments Affecting Rehab Potential MCI, chronic back pain    PT Frequency 4x / week    PT Duration Other (comment)   5 weeks   PT Treatment/Interventions ADLs/Self Care Home Management;Therapeutic exercise;Patient/family education;Neuromuscular re-education;Therapeutic activities;Functional mobility training;Balance training;Gait training;Stair training;Taping;Energy conservation;Passive range of motion;DME Instruction;Cognitive remediation    PT Next Visit Plan  Initiate LSVT BIG Maximal daily exercises (see note for adaptions), functional  component tasks, hierarchy tasks, carryover tasks, big gait/walking    PT Home Exercise Plan LSVT 7 adapted maximal daily exercises (exercies 5 and 7 performed with seated versions), 5 functional component tasks, hierarchy tasks, big walking, and carryover task (updated 9/19)    Consulted and Agree with Plan of Care Patient;Family member/caregiver    Family Member Consulted spouse             Patient will benefit from skilled therapeutic intervention in order to improve the following deficits and impairments:  Abnormal gait, Decreased activity tolerance, Decreased cognition, Decreased endurance, Decreased knowledge of use of DME, Decreased range of motion, Decreased strength, Hypomobility, Dizziness, Pain, Improper body mechanics, Impaired UE functional use, Decreased balance, Decreased coordination, Decreased mobility, Difficulty walking, Increased edema, Impaired flexibility, Increased muscle spasms, Postural dysfunction  Visit Diagnosis: Other abnormalities of gait and mobility  Other lack of coordination  Unsteadiness on feet  Parkinson disease ([place])     Problem List Patient Active Problem List   Diagnosis Date Noted   Chronic bilateral low back pain 06/18/2021   Elevated blood pressure reading 05/23/2021   Osteopenia of neck of right femur 09/06/2020   Obesity 08/29/2020   Parkinson disease ([place]) 02/22/2020   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   Bipolar disorder, in full remission, most recent episode depressed ([place]) 11/08/2019   Avitaminosis D 08/24/2019   Insomnia due to mental condition 05/31/2019   Bipolar I disorder, most recent episode depressed ([place]) 05/31/2019   Alcohol use disorder, moderate, in sustained remission ([place]) 05/31/2019   Elevated tumor markers 05/27/2019   GAD (generalized anxiety disorder) 04/10/2019   MDD (major depressive disorder), severe ([place]) 01/31/2019   High serum  carbohydrate antigen 19-9 (CA19-9) 12/01/2018   Adjustment disorder with anxious mood 11/24/2018   Essential hypertension 11/24/2018   B12 deficiency 10/17/2018   Incisional hernia, without obstruction or gangrene 09/12/2018   Genetic testing 04/07/2018   Malignant neoplasm of upper-outer quadrant of right breast in female, estrogen receptor positive ([place]) 03/17/2018   Malignant neoplasm of upper-inner quadrant of right breast in female, estrogen receptor positive ([place]) 03/17/2018   History of psychosis 02/07/2018   History of insomnia 02/07/2018    Zollie Pee, PT 07/21/2021, 12:19 PM  [place] MAIN [facility] SERVICES [address] [place], [place], [address] Phone: [phone]   Fax:  [phone]  Name: Pryscilla Amann MRN: [id number] Date of Birth: 01/01/1946

## 2021-07-22 ENCOUNTER — Ambulatory Visit: Payer: Medicare PPO

## 2021-07-22 ENCOUNTER — Ambulatory Visit: Payer: Medicare PPO | Admitting: Speech Pathology

## 2021-07-22 DIAGNOSIS — M6281 Muscle weakness (generalized): Secondary | ICD-10-CM | POA: Diagnosis not present

## 2021-07-22 DIAGNOSIS — R278 Other lack of coordination: Secondary | ICD-10-CM | POA: Diagnosis not present

## 2021-07-22 DIAGNOSIS — M79605 Pain in left leg: Secondary | ICD-10-CM | POA: Diagnosis not present

## 2021-07-22 DIAGNOSIS — G20 Parkinson's disease: Secondary | ICD-10-CM

## 2021-07-22 DIAGNOSIS — M79604 Pain in right leg: Secondary | ICD-10-CM | POA: Diagnosis not present

## 2021-07-22 DIAGNOSIS — R2689 Other abnormalities of gait and mobility: Secondary | ICD-10-CM

## 2021-07-22 DIAGNOSIS — R2681 Unsteadiness on feet: Secondary | ICD-10-CM

## 2021-07-22 DIAGNOSIS — R471 Dysarthria and anarthria: Secondary | ICD-10-CM | POA: Diagnosis not present

## 2021-07-22 NOTE — Therapy (Signed)
[place] MAIN [facility] SERVICES [address] [place], [place], [address] Phone: [phone]   Fax:  [phone]  Physical Therapy Treatment  Patient Details  Name: Kendra [name] MRN: [id number] Date of Birth: 1946-09-26 Referring Provider (PT): Arbutus Ped., MD   Encounter Date: 07/22/2021   PT End of Session - 07/22/21 1536     Visit Number 7    Number of Visits 17    Date for PT Re-Evaluation 08/13/21    Authorization Type LSVT BIG    Authorization Time Period Evaluation performed on 07/09/2021    PT Start Time 1601    PT Stop Time 1658    PT Time Calculation (min) 57 min    Equipment Utilized During Treatment Gait belt    Activity Tolerance Patient tolerated treatment well;Patient limited by fatigue    Behavior During Therapy [facility] for tasks assessed/performed             Past Medical History:  Diagnosis Date   Anxiety    Breast cancer ([place]) 03/2018   right breast cancer at 11:00 and 1:00   Bursitis    bilateral hips and knees   Complication of anesthesia    Depression    Family history of breast cancer 03/24/2018   Family history of lung cancer 03/24/2018   History of alcohol dependence ([place]) 2007   no ETOH; resolved since 2007   History of hiatal hernia    History of psychosis 2014   due to sleep disturbance   Hypertension    Parkinson disease ([place])    Personal history of radiation therapy 06/2018-07/2018   right breast ca   PONV (postoperative nausea and vomiting)     Past Surgical History:  Procedure Laterality Date   BREAST BIOPSY Right 03/14/2018   11:00 DCIS and invasive ductal carcinoma   BREAST BIOPSY Right 03/14/2018   1:00 Invasive ductal carcinoma   BREAST LUMPECTOMY Right 04/27/2018   lumpectomy of 11 and 1:00 cancers, clear margins, negative LN   BREAST LUMPECTOMY WITH RADIOACTIVE SEED AND SENTINEL LYMPH NODE BIOPSY Right 04/27/2018   Procedure: RIGHT BREAST LUMPECTOMY WITH RADIOACTIVE SEED X 2  AND RIGHT SENTINEL LYMPH NODE BIOPSY;  Surgeon: [name], MD;  Location: [place];  Service: General;  Laterality: Right;   CATARACT EXTRACTION W/PHACO Left 08/30/2019   Procedure: CATARACT EXTRACTION PHACO AND INTRAOCULAR LENS PLACEMENT ([place]) LEFT panoptix toric  01:20.5  12.7%  10.26;  Surgeon: Leandrew Koyanagi, MD;  Location: [place];  Service: Ophthalmology;  Laterality: Left;   CATARACT EXTRACTION W/PHACO Right 09/20/2019   Procedure: CATARACT EXTRACTION PHACO AND INTRAOCULAR LENS PLACEMENT ([facility]) RIGHT 5.71  00:36.4  15.7%;  Surgeon: Leandrew Koyanagi, MD;  Location: [place];  Service: Ophthalmology;  Laterality: Right;   LAPAROTOMY N/A 02/10/2018   Procedure: EXPLORATORY LAPAROTOMY;  Surgeon: Isabel Caprice, MD;  Location: [facility];  Service: Gynecology;  Laterality: N/A;   MASS EXCISION  01/2018   abdominal   OVARIAN CYST REMOVAL     SALPINGOOPHORECTOMY Bilateral 02/10/2018   Procedure: BILATERAL SALPINGO OOPHORECTOMY; PERITONEAL WASHINGS;  Surgeon: Isabel Caprice, MD;  Location: [facility];  Service: Gynecology;  Laterality: Bilateral;   TONSILLECTOMY     TONSILLECTOMY AND ADENOIDECTOMY  1953    There were no vitals filed for this visit.   Subjective Assessment - 07/22/21 1601     Subjective Pt reports doing carryover assignment and that she did her exercises already this morning and  her exercises yesterday evening.    Patient is accompained by: Family member   spouse, [name]   Pertinent History Pt presents to PT for LSVT BIG evaluation. Pt diagnosed with PD in 2019. Pt reports difficulty with gait speed, freezing, transfers, balance, cognition, and use of hands d/t tremors. Pt has previously attended PT for treatment of back pain. Per chart other PMH includes hx of breast cancer, HTN, MCI, insomnia, cataracts, bipolar 1, alcohol use disorder, GAD, MDD, osteopenia of neck of R femur, chronic B low back pain with radiating pain to  buttock and posterior thighs and calves    Limitations Sitting;Standing;Walking;Reading;Lifting;Writing;House hold activities    How long can you sit comfortably? limited if not well positioned. Feet must be propped up.    How long can you stand comfortably? not comfortable, pain is constant    How long can you walk comfortably? 5 min    Diagnostic tests No recent imaging pertinent to PD per chart. Other most recent imaging per Chancis, Juanda Crumble note 06/27/2021: 'MRI lumbar spine without contrast from [facility] dated 01/31/2020 imaging and report reviewed today. L5-S1 central disc bulging with severe bilateral facet arthropathy no central or foraminal stenosis. L4-5 grade 1 anterolisthesis with severe bilateral facet arthropathy and ligamentum flavum hypertrophy. She has severe right and moderate to severe left foraminal stenosis. Moderate central stenosis. L3-4 broad-based disc bulging with severe bilateral facet arthropathy and ligamentum flavum hypertrophy. Severe central stenosis. Moderate severe subarticular stenosis. No foraminal stenosis. L2-3 diffuse disc bulging with severe bilateral facet arthropathy and ligamentum flavum hypertrophy. Severe central stenosis and severe bilateral subarticular recess stenosis. Moderate left and mild to moderate right foraminal stenosis. L1-2 broad-based disc bulging with mild bilateral facet arthropathy. No central or foraminal stenosis."    Patient Stated Goals Pt would like to be able to walk faster, get up from a chair more easily    Currently in Pain? No/denies   pt reports general pain d/t PD, none at rest   Pain Onset More than a month ago    Pain Onset More than a month ago            LSVT BIG Treatment:   Patient seen for LSVT Daily Session Maximal Daily Exercises for facilitation/coordination of movement Sustained movements are designed to rescale the amplitude of movement output for generalization to daily functional activities.   Adapted  Maximal Daily Exercises: Floor to Ceiling 10 with 10 second hold; frequent cuing to LUE for abduction; pt rates hard  Side to Side 10 Bilateral with 10 second hold  Forward Step and Reach 12 Bilateral Sideways Step and Reach 12 Bilateral Backwards Step and Reach 10 Bilateral - modified to Seated Maximal Daily Exercise - requires 70% cuing d/t difficulty with coordination  Forwards Rock and Reach 12 Bilateral Sideways Rock and Reach 10 Bilateral - modified to Seated Maximal Daily exercise    Daily Exercise Comments: Pt performing exercises 5 and 7 seated.  PT provides 70-80% cueing with maximal Daily exercises.  More fatigued today likely d/t session at end of day and following LSVT LOUD.  However, patient did perform increased volume of reps.     Functional Component Tasks - performance and progress: Sit to Stand x 5 from standard chair with cuing for big reach, performing modified version where pt holds on to chair with one hand for initial bend/reach 2. Walk and turn 5x 3. Bend and reach 5x 4. Open Jar 5x  5. Step up on step 5x Comments:  Patient still consistently is able to open jar exhibiting sustained large amplitude movement.  Patient also consistently taking large amplitude steps on bilateral lower extremities for step up exercise.  Patient still slightly bradykinetic with bend and reach.  She does exhibit some postural instability with walking and turning requiring up to min assist from PT to maintain balance.    Hierarchy Tasks - performance and progress: Contact guard assist to min assist provided throughout unless otherwise noted for all upright tasks Going for a walk outside (uneven, varied surfaces); pt ambulates around PT clinic gym and must navigate over and on compliant surface as well as ambulate on  firm surface.  Requires up to min assist due to postural instability with changing from firm surface to compliant surface. 2. Handwriting - signing name with addition of the day's  date BIG; smoother signature   3. Ascending/descending stairs; big walk up to stairs with big reach to handrails and big step up onto first step; consistently sustains large amplitude movement.  Does require some cueing for upright posture.   Gait - performance and progress: Continued use of close CGA to min assist provided throughout d/t postural instability.  PT continues to provide cueing to maintain big steps and armswing with turning.  PT provides tactile cue to left upper extremity to improve arm swing.  Patient ambulates 2x148 ft (incorporated into hierarchy task as well where patient ambulates over compliant surface see above).  Patient very fatigued high end of gait training.  Carryover Assignment: Pt instructed to sit up in bed from side-lying position on her bed when she goes to get up in the morning.   Homework: Pt to perform all 7 LSVT BIG exercises (adapted and seated for 5 and 7 see above), 5 Functional Component Tasks and big walking.  Reinforced patient in safety with adapted and seated maximal daily exercises.  PT continues to reinforce the following safety instructions from prior sessions. PT instructed patient on use of upper extremity support with exercises.  Patient and spouse verbalized understanding. Updated and instructed pt in carryover task.     Pt educated throughout session about proper posture and technique with exercises. Improved exercise technique, movement at target joints, use of target muscles after min to mod verbal, visual, tactile cues.   Note: Portions of this document were prepared using Dragon voice recognition software and although reviewed may contain unintentional dictation errors in syntax, grammar, or spelling.     PT Education - 07/22/21 1702     Education provided Yes    Education Details Exercise technique, body mechanics, updated carryover task    Person(s) Educated Patient    Methods Explanation;Demonstration;Tactile cues;Verbal cues;Handout     Comprehension Verbalized understanding;Returned demonstration;Verbal cues required;Tactile cues required;Need further instruction              PT Short Term Goals - 07/10/21 1323       PT SHORT TERM GOAL #1   Title Pt will demonstrate independence with LSVT BIG HEP to improve strength/mobility for improved safety and ease with ADLs and to maintain/continue gains beyond LSVT program.    Baseline 9/7: to be initiated next session    Time 2    Period Weeks    Status New    Target Date 07/23/21               PT Long Term Goals - 07/10/21 1325       PT LONG TERM GOAL #1   Title Patient will increase  FOTO score to equal to or greater than 47  to demonstrate statistically significant improvement in mobility and quality of life.    Baseline 9/7: 34    Time 5    Period Weeks    Status New    Target Date 08/13/21      PT LONG TERM GOAL #2   Title Patient (> 84 years old) will complete five times sit to stand test in < 15 seconds indicating decreased fall risk.    Baseline 9/7: 18.5 sec    Time 5    Period Weeks    Status New    Target Date 08/13/21      PT LONG TERM GOAL #3   Title Patient will increase 10 meter walk test to >1.69m/s with WFL B step-length for 100% of test to improve ease with gait and to override hypokentic and bradykinetic movement.    Baseline 9/7: 0.74 m/s, decreased B step length and rigidty of movement    Time 5    Period Weeks    Status New    Target Date 08/13/21      PT LONG TERM GOAL #4   Title Patient will reduce timed up and go to <11 seconds to reduce fall risk and demonstrate improved transfer/gait ability and ability to perform turns safely    Baseline 9/7: 17 seconds    Time 5    Period Weeks    Status New    Target Date 08/13/21      PT LONG TERM GOAL #5   Title Pt will demo or report ability to open standard jar using only 1-2 attempts in order to exhibit improved functional mobility for ADLs.    Baseline 9/7: Pt reports she is  unable to open jars    Time 5    Period Weeks    Status New    Target Date 08/13/21                   Plan - 07/22/21 1703     Clinical Impression Statement Patient more limited by fatigue compared to prior sessions, likely due to session being completed at end of day whereas patient has been coming to morning appointments otherwise.  While patient with increased fatigue, she shows improvement with coordination and sequencing with seated backward step and reach.  Patient also sustains larger amplitude stepping for ascending and descending steps.  Patient exhibits smoother handwriting, indicating improved upper extremity motor control.  She requires approximately 70-85% cueing for gait mechanics, with focus still on the left shoulder extension for arm swing.  She did exhibit postural instability with ambulating over compliant surface requiring up to min assist and upper extremity support to regain balance.  Patient will benefit from further skilled LSVT big physical therapy treatments to continue to improve mobility, strength, balance, and gait to increase ease and safety with all ADLs.    Personal Factors and Comorbidities Age;Sex;Fitness;Time since onset of injury/illness/exacerbation;Comorbidity 1;Comorbidity 3+;Comorbidity 2    Comorbidities MCI, HTN, CA, cataracts, bipolar 1, GAD, insomnia, chronic LBP    Examination-Activity Limitations Bend;Sit;Squat;Stairs;Stand;Locomotion Level;Self Feeding;Bathing;Transfers;Lift;Bed Mobility    Examination-Participation Restrictions Cleaning;Community Activity;Shop;Yard Work;Meal Prep;Personal Finances;Driving    Stability/Clinical Decision Making Evolving/Moderate complexity    Rehab Potential Fair    Clinical Impairments Affecting Rehab Potential MCI, chronic back pain    PT Frequency 4x / week    PT Duration Other (comment)   5 weeks   PT Treatment/Interventions ADLs/Self Care Home Management;Therapeutic exercise;Patient/family  education;Neuromuscular  re-education;Therapeutic activities;Functional mobility training;Balance training;Gait training;Stair training;Taping;Energy conservation;Passive range of motion;DME Instruction;Cognitive remediation    PT Next Visit Plan Initiate LSVT BIG Maximal daily exercises (see note for adaptions), functional component tasks, hierarchy tasks, carryover tasks, big gait/walking    PT Home Exercise Plan LSVT 7 adapted maximal daily exercises (exercies 5 and 7 performed with seated versions), 5 functional component tasks, hierarchy tasks, big walking, and carryover task (updated 9/20)    Consulted and Agree with Plan of Care Patient;Family member/caregiver    Family Member Consulted spouse             Patient will benefit from skilled therapeutic intervention in order to improve the following deficits and impairments:  Abnormal gait, Decreased activity tolerance, Decreased cognition, Decreased endurance, Decreased knowledge of use of DME, Decreased range of motion, Decreased strength, Hypomobility, Dizziness, Pain, Improper body mechanics, Impaired UE functional use, Decreased balance, Decreased coordination, Decreased mobility, Difficulty walking, Increased edema, Impaired flexibility, Increased muscle spasms, Postural dysfunction  Visit Diagnosis: Other abnormalities of gait and mobility  Unsteadiness on feet  Other lack of coordination  Parkinson disease ([place])     Problem List Patient Active Problem List   Diagnosis Date Noted   Chronic bilateral low back pain 06/18/2021   Elevated blood pressure reading 05/23/2021   Osteopenia of neck of right femur 09/06/2020   Obesity 08/29/2020   Parkinson disease ([place]) 02/22/2020   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   Bipolar disorder, in full remission, most recent episode depressed ([place]) 11/08/2019   Avitaminosis D 08/24/2019   Insomnia due to mental condition 05/31/2019   Bipolar I disorder, most recent episode depressed ([place])  05/31/2019   Alcohol use disorder, moderate, in sustained remission ([place]) 05/31/2019   Elevated tumor markers 05/27/2019   GAD (generalized anxiety disorder) 04/10/2019   MDD (major depressive disorder), severe ([place]) 01/31/2019   High serum carbohydrate antigen 19-9 (CA19-9) 12/01/2018   Adjustment disorder with anxious mood 11/24/2018   Essential hypertension 11/24/2018   B12 deficiency 10/17/2018   Incisional hernia, without obstruction or gangrene 09/12/2018   Genetic testing 04/07/2018   Malignant neoplasm of upper-outer quadrant of right breast in female, estrogen receptor positive ([place]) 03/17/2018   Malignant neoplasm of upper-inner quadrant of right breast in female, estrogen receptor positive ([place]) 03/17/2018   History of psychosis 02/07/2018   History of insomnia 02/07/2018    Zollie Pee, PT 07/22/2021, 5:13 PM  [place] MAIN [facility] SERVICES [address] [place], [place], [address] Phone: [phone]   Fax:  [phone]  Name: Kendra [name] MRN: [id number] Date of Birth: 1946-09-19

## 2021-07-22 NOTE — Therapy (Signed)
[place] MAIN [facility] SERVICES [address] [place], [place], [address] Phone: [phone]   Fax:  [phone]  Speech Language Pathology Treatment  Patient Details  Name: Kendra [name] MRN: [id number] Date of Birth: 05/05/1946 Referring Provider (SLP): Jerrol Banana., MD   Encounter Date: 07/22/2021   End of Session - 07/22/21 1803     Visit Number 7    Number of Visits 17    Date for SLP Re-Evaluation 10/07/21    SLP Start Time 1500    SLP Stop Time  1600    SLP Time Calculation (min) 60 min    Activity Tolerance Patient tolerated treatment well             Past Medical History:  Diagnosis Date   Anxiety    Breast cancer ([place]) 03/2018   right breast cancer at 11:00 and 1:00   Bursitis    bilateral hips and knees   Complication of anesthesia    Depression    Family history of breast cancer 03/24/2018   Family history of lung cancer 03/24/2018   History of alcohol dependence ([place]) 2007   no ETOH; resolved since 2007   History of hiatal hernia    History of psychosis 2014   due to sleep disturbance   Hypertension    Parkinson disease ([place])    Personal history of radiation therapy 06/2018-07/2018   right breast ca   PONV (postoperative nausea and vomiting)     Past Surgical History:  Procedure Laterality Date   BREAST BIOPSY Right 03/14/2018   11:00 DCIS and invasive ductal carcinoma   BREAST BIOPSY Right 03/14/2018   1:00 Invasive ductal carcinoma   BREAST LUMPECTOMY Right 04/27/2018   lumpectomy of 11 and 1:00 cancers, clear margins, negative LN   BREAST LUMPECTOMY WITH RADIOACTIVE SEED AND SENTINEL LYMPH NODE BIOPSY Right 04/27/2018   Procedure: RIGHT BREAST LUMPECTOMY WITH RADIOACTIVE SEED X 2 AND RIGHT SENTINEL LYMPH NODE BIOPSY;  Surgeon: [name], MD;  Location: [place];  Service: General;  Laterality: Right;   CATARACT EXTRACTION W/PHACO Left 08/30/2019   Procedure: CATARACT  EXTRACTION PHACO AND INTRAOCULAR LENS PLACEMENT ([place]) LEFT panoptix toric  01:20.5  12.7%  10.26;  Surgeon: Leandrew Koyanagi, MD;  Location: [place];  Service: Ophthalmology;  Laterality: Left;   CATARACT EXTRACTION W/PHACO Right 09/20/2019   Procedure: CATARACT EXTRACTION PHACO AND INTRAOCULAR LENS PLACEMENT ([facility]) RIGHT 5.71  00:36.4  15.7%;  Surgeon: Leandrew Koyanagi, MD;  Location: [place];  Service: Ophthalmology;  Laterality: Right;   LAPAROTOMY N/A 02/10/2018   Procedure: EXPLORATORY LAPAROTOMY;  Surgeon: Isabel Caprice, MD;  Location: [facility];  Service: Gynecology;  Laterality: N/A;   MASS EXCISION  01/2018   abdominal   OVARIAN CYST REMOVAL     SALPINGOOPHORECTOMY Bilateral 02/10/2018   Procedure: BILATERAL SALPINGO OOPHORECTOMY; PERITONEAL WASHINGS;  Surgeon: Isabel Caprice, MD;  Location: [facility];  Service: Gynecology;  Laterality: Bilateral;   TONSILLECTOMY     TONSILLECTOMY AND ADENOIDECTOMY  1953    There were no vitals filed for this visit.   Subjective Assessment - 07/22/21 1801     Subjective "They waved me in." (did not get to use LOUD voice at check-in)    Patient is accompained by: Family member    Currently in Pain? --   generalized pain, none at rest  ADULT SLP TREATMENT - 07/22/21 1802       General Information   Behavior/Cognition Alert;Cooperative    HPI Patient is a 75 y.o. female with history of Parkinson's disease, anxiety, depression, psychosis, HTN. bipolar disorder, alcoholism, memory issues since 2005, hx head trauma. Referred for LSVT BIG and LOUD.      Treatment Provided   Treatment provided Cognitive-Linquistic      Pain Assessment   Pain Assessment No/denies pain      Cognitive-Linquistic Treatment   Treatment focused on Dysarthria;Patient/family/caregiver education    Skilled Treatment LSVT Daily tasks with occasional min cues. Loud /a/ averaged 83 dB, duration 17.8 seconds, pitch avg  229 Hz.  Max F0 for pitch glides was 290 Hz (85 dB avg), min F0 for low glides was 166 Hz (84 dB avg). Functional phrases averaged 84 dB with modified independence. Hierarchical loudness drills at sentence level averaged 83 dB, with occasional cues for loudness when cognitive load added.        Assessment / Recommendations / Plan   Plan Continue with current plan of care      Progression Toward Goals   Progression toward goals Progressing toward goals              SLP Education - 07/22/21 1802     Education Details pt is not shouting even though she feels she is    Person(s) Educated Patient    Methods Explanation    Comprehension Verbalized understanding              SLP Short Term Goals - 07/10/21 1936       SLP SHORT TERM GOAL #1   Title The patient will complete Daily Tasks (Maximum duration "ah", High/Lows, and Functional Phrases) at average loudness >/= 85 dB and with loud, good quality voice with min cues.    Time 10    Period --   sessions   Status New      SLP SHORT TERM GOAL #2   Title The patient will complete Hierarchal Speech Loudness reading drills (words/phrases, sentences) at average >/= 75 dB and with loud, good quality voice with min cues.    Time 10    Period --   sessions   Status New      SLP SHORT TERM GOAL #3   Title The patient will participate in 5-8 minutes conversation, maintaining average loudness of 75 dB and loud, good quality voice with min cues.    Time 10    Period --   sessions   Status New              SLP Long Term Goals - 07/10/21 1937       SLP LONG TERM GOAL #1   Title The patient will complete Daily Tasks (Maximum duration "ah", High/Lows, and Functional Phrases) at average loudness >/= 85 dB and with loud, good quality voice    Time 4    Period Weeks   or 17 sessions, for all LTGs   Status New    Target Date 08/10/21      SLP LONG TERM GOAL #2   Title The patient will complete Hierarchal Speech Loudness reading  drills (words/phrases, sentences, and paragraph) at average >/= 75 dB and with loud, good quality voice.    Time 4    Period Weeks    Status New    Target Date 08/10/21      SLP LONG TERM GOAL #3   Title The  patient will participate in 15-20 minutes conversation, maintaining average loudness of >/= 75 dB and loud, good quality voice.    Time 4    Period Weeks    Status New    Target Date 08/10/21      SLP LONG TERM GOAL #4   Title Patient will report improved communication effectiveness as measured by Communicative Effectiveness Survey.    Baseline 19/32 at eval    Time 4    Period Weeks    Status New    Target Date 08/10/21              Plan - 07/22/21 1803     Clinical Impression Statement Patient presents with mild hypokinetic dysarthria characterized by reduced vocal intensity, short rushes of speech/variable rate, and hoarse vocal quality. Progressing to 1-2 sentence tasks with cognitive load, demonstrating improving awareness/self-correction. Cues remain necessary for carryover in conversation. I recommend skilled ST for LSVT LOUD 4x a week for 4 weeks in order to improve vocal quality, intelligibility, and communicative effectiveness.    Speech Therapy Frequency 4x / week    Duration 4 weeks    Treatment/Interventions Language facilitation;Environmental controls;Cueing hierarchy;SLP instruction and feedback;Compensatory techniques;Functional tasks;Patient/family education;Multimodal communcation approach   LSVT LOUD   Potential to Achieve Goals Good    Potential Considerations Ability to learn/carryover information    SLP Home Exercise Plan to be provided next session    Consulted and Agree with Plan of Care Patient;Family member/caregiver    Family Member Consulted spouse             Patient will benefit from skilled therapeutic intervention in order to improve the following deficits and impairments:   Dysarthria and anarthria  Parkinson disease  ([place])    Problem List Patient Active Problem List   Diagnosis Date Noted   Chronic bilateral low back pain 06/18/2021   Elevated blood pressure reading 05/23/2021   Osteopenia of neck of right femur 09/06/2020   Obesity 08/29/2020   Parkinson disease ([place]) 02/22/2020   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   Bipolar disorder, in full remission, most recent episode depressed ([place]) 11/08/2019   Avitaminosis D 08/24/2019   Insomnia due to mental condition 05/31/2019   Bipolar I disorder, most recent episode depressed ([place]) 05/31/2019   Alcohol use disorder, moderate, in sustained remission ([place]) 05/31/2019   Elevated tumor markers 05/27/2019   GAD (generalized anxiety disorder) 04/10/2019   MDD (major depressive disorder), severe ([place]) 01/31/2019   High serum carbohydrate antigen 19-9 (CA19-9) 12/01/2018   Adjustment disorder with anxious mood 11/24/2018   Essential hypertension 11/24/2018   B12 deficiency 10/17/2018   Incisional hernia, without obstruction or gangrene 09/12/2018   Genetic testing 04/07/2018   Malignant neoplasm of upper-outer quadrant of right breast in female, estrogen receptor positive ([place]) 03/17/2018   Malignant neoplasm of upper-inner quadrant of right breast in female, estrogen receptor positive ([place]) 03/17/2018   History of psychosis 02/07/2018   History of insomnia 02/07/2018   Deneise Lever, [place], CCC-SLP Speech-Language Pathologist  Aliene Altes 07/22/2021, 6:04 PM  [place] [address] [place], [place], [address] Phone: [phone]   Fax:  [phone]   Name: Tanda Morrissey MRN: [id number] Date of Birth: [birth year]

## 2021-07-23 ENCOUNTER — Ambulatory Visit: Payer: Medicare PPO | Admitting: Speech Pathology

## 2021-07-23 ENCOUNTER — Ambulatory Visit: Payer: Medicare PPO

## 2021-07-23 ENCOUNTER — Other Ambulatory Visit: Payer: Self-pay

## 2021-07-23 DIAGNOSIS — M79605 Pain in left leg: Secondary | ICD-10-CM | POA: Diagnosis not present

## 2021-07-23 DIAGNOSIS — G20 Parkinson's disease: Secondary | ICD-10-CM

## 2021-07-23 DIAGNOSIS — R2681 Unsteadiness on feet: Secondary | ICD-10-CM | POA: Diagnosis not present

## 2021-07-23 DIAGNOSIS — M6281 Muscle weakness (generalized): Secondary | ICD-10-CM

## 2021-07-23 DIAGNOSIS — R471 Dysarthria and anarthria: Secondary | ICD-10-CM | POA: Diagnosis not present

## 2021-07-23 DIAGNOSIS — M79604 Pain in right leg: Secondary | ICD-10-CM | POA: Diagnosis not present

## 2021-07-23 DIAGNOSIS — R2689 Other abnormalities of gait and mobility: Secondary | ICD-10-CM

## 2021-07-23 DIAGNOSIS — R278 Other lack of coordination: Secondary | ICD-10-CM | POA: Diagnosis not present

## 2021-07-23 NOTE — Therapy (Signed)
[place] MAIN [facility] SERVICES [address] [place], [place], [address] Phone: [phone]   Fax:  [phone]  Physical Therapy Treatment  Patient Details  Name: Kendra [name] MRN: [id number] Date of Birth: 74-11-1945 Referring Provider (PT): Arbutus Ped., MD   Encounter Date: 74/21/2022   PT End of Session - 07/24/21 0804     Visit Number 8    Number of Visits 17    Date for PT Re-Evaluation 08/13/21    Authorization Type LSVT BIG    Authorization Time Period Evaluation performed on 07/09/2021    PT Start Time 1117    PT Stop Time 1215    PT Time Calculation (min) 58 min    Equipment Utilized During Treatment Gait belt    Activity Tolerance Patient tolerated treatment well;Patient limited by fatigue;Patient limited by pain    Behavior During Therapy [facility] for tasks assessed/performed             Past Medical History:  Diagnosis Date   Anxiety    Breast cancer ([place]) 03/2018   right breast cancer at 11:00 and 1:00   Bursitis    bilateral hips and knees   Complication of anesthesia    Depression    Family history of breast cancer 03/24/2018   Family history of lung cancer 03/24/2018   History of alcohol dependence ([place]) 2007   no ETOH; resolved since 2007   History of hiatal hernia    History of psychosis 2014   due to sleep disturbance   Hypertension    Parkinson disease ([place])    Personal history of radiation therapy 06/2018-07/2018   right breast ca   PONV (postoperative nausea and vomiting)     Past Surgical History:  Procedure Laterality Date   BREAST BIOPSY Right 03/14/2018   11:00 DCIS and invasive ductal carcinoma   BREAST BIOPSY Right 03/14/2018   1:00 Invasive ductal carcinoma   BREAST LUMPECTOMY Right 04/27/2018   lumpectomy of 11 and 1:00 cancers, clear margins, negative LN   BREAST LUMPECTOMY WITH RADIOACTIVE SEED AND SENTINEL LYMPH NODE BIOPSY Right 04/27/2018   Procedure: RIGHT BREAST LUMPECTOMY  WITH RADIOACTIVE SEED X 2 AND RIGHT SENTINEL LYMPH NODE BIOPSY;  Surgeon: [name], MD;  Location: [place];  Service: General;  Laterality: Right;   CATARACT EXTRACTION W/PHACO Left 08/30/2019   Procedure: CATARACT EXTRACTION PHACO AND INTRAOCULAR LENS PLACEMENT ([place]) LEFT panoptix toric  01:20.5  12.7%  10.26;  Surgeon: Leandrew Koyanagi, MD;  Location: [place];  Service: Ophthalmology;  Laterality: Left;   CATARACT EXTRACTION W/PHACO Right 09/20/2019   Procedure: CATARACT EXTRACTION PHACO AND INTRAOCULAR LENS PLACEMENT ([facility]) RIGHT 5.71  00:36.4  15.7%;  Surgeon: Leandrew Koyanagi, MD;  Location: [place];  Service: Ophthalmology;  Laterality: Right;   LAPAROTOMY N/A 02/10/2018   Procedure: EXPLORATORY LAPAROTOMY;  Surgeon: Isabel Caprice, MD;  Location: [facility];  Service: Gynecology;  Laterality: N/A;   MASS EXCISION  01/2018   abdominal   OVARIAN CYST REMOVAL     SALPINGOOPHORECTOMY Bilateral 02/10/2018   Procedure: BILATERAL SALPINGO OOPHORECTOMY; PERITONEAL WASHINGS;  Surgeon: Isabel Caprice, MD;  Location: [facility];  Service: Gynecology;  Laterality: Bilateral;   TONSILLECTOMY     TONSILLECTOMY AND ADENOIDECTOMY  1953    There were no vitals filed for this visit.   Subjective Assessment - 07/24/21 0803     Subjective Pt reports BLE pain currently. She forgot to do carryover assignment yesterday,  but otherwise has been consistently performing her HEP.    Patient is accompained by: Family member   spouse, [name]   Pertinent History Pt presents to PT for LSVT BIG evaluation. Pt diagnosed with PD in 2019. Pt reports difficulty with gait speed, freezing, transfers, balance, cognition, and use of hands d/t tremors. Pt has previously attended PT for treatment of back pain. Per chart other PMH includes hx of breast cancer, HTN, MCI, insomnia, cataracts, bipolar 1, alcohol use disorder, GAD, MDD, osteopenia of neck of R femur, chronic B low  back pain with radiating pain to buttock and posterior thighs and calves    Limitations Sitting;Standing;Walking;Reading;Lifting;Writing;House hold activities    How long can you sit comfortably? limited if not well positioned. Feet must be propped up.    How long can you stand comfortably? not comfortable, pain is constant    How long can you walk comfortably? 5 min    Diagnostic tests No recent imaging pertinent to PD per chart. Other most recent imaging per Chancis, Juanda Crumble note 06/27/2021: 'MRI lumbar spine without contrast from [facility] dated 01/31/2020 imaging and report reviewed today. L5-S1 central disc bulging with severe bilateral facet arthropathy no central or foraminal stenosis. L4-5 grade 1 anterolisthesis with severe bilateral facet arthropathy and ligamentum flavum hypertrophy. She has severe right and moderate to severe left foraminal stenosis. Moderate central stenosis. L3-4 broad-based disc bulging with severe bilateral facet arthropathy and ligamentum flavum hypertrophy. Severe central stenosis. Moderate severe subarticular stenosis. No foraminal stenosis. L2-3 diffuse disc bulging with severe bilateral facet arthropathy and ligamentum flavum hypertrophy. Severe central stenosis and severe bilateral subarticular recess stenosis. Moderate left and mild to moderate right foraminal stenosis. L1-2 broad-based disc bulging with mild bilateral facet arthropathy. No central or foraminal stenosis."    Patient Stated Goals Pt would like to be able to walk faster, get up from a chair more easily    Currently in Pain? Yes    Pain Location Leg    Pain Orientation Right;Left    Pain Onset More than a month ago    Pain Onset More than a month ago             LSVT BIG Treatment:   Patient seen for LSVT Daily Session Maximal Daily Exercises for facilitation/coordination of movement Sustained movements are designed to rescale the amplitude of movement output for generalization to daily  functional activities.   Adapted Maximal Daily Exercises: Floor to Ceiling 10 with 10 second hold; frequent cuing to LUE for abduction; pt rates hard  Side to Side 10 Bilateral with 10 second hold  Forward Step and Reach 10 Bilateral Sideways Step and Reach 10 Bilateral Backwards Step and Reach 10 Bilateral - modified to Seated Maximal Daily Exercise  Forwards Rock and Reach 10 Bilateral Sideways Rock and Reach 10 Bilateral - modified to Seated Maximal Daily exercise    Daily Exercise Comments: Pt performing exercises 5 and 7 seated.  PT continues to provide 70-80% cueing overall with maximal Daily exercises.  Pt with increased fatigue and pain today (had taken pain medicine prior to sessions), limiting her activity tolerance, so reps were not able to be increased on this date beyond first two exercises. Pt did show some improvement with seated backwards step and reach technique.    Functional Component Tasks - performance and progress: Sit to Stand x 5 from standard chair with cuing for big reach, performing modified version where pt holds on to chair with one hand for initial  bend/reach 2. Walk and turn 5x 3. Bend and reach 5x 4. Open Jar 5x  5. Step up on step 5x Comments: Pt performs majority well with good technique, but is challenged with step-ups on step d/t fatigue.    Hierarchy Tasks - performance and progress: Contact guard assist to min assist provided throughout unless otherwise noted for all upright tasks Going for a walk outside (uneven, varied surfaces); Pt not yet ready for outside ambulation, sp pt continues to ambulate around PT clinic gym and must navigate over and on compliant surface as well as ambulate on  firm surface. Pt performs with moderate distractions d/t business of clinic gym.  Requires up to mod assist due to postural instability with changing from firm surface to compliant surface. Amb. Intervention had to be discontinued early d/t pt reports of  dizziness/lightheadedness where PT assisted pt back into chair. 2. Handwriting - signing name with addition of the day's date BIG; signature and date consistently smooth, able to sustain BIG writing 3. Ascending/descending stairs; big walk up to stairs with big reach to handrails, upright posture and big step up onto first step; consistently sustains large amplitude movement with step.  Continues to require cuing for upright posture.   Gait - performance and progress: Continued use of close CGA to mod assist provided throughout d/t postural instability, ending early as pt reports dizziness/lightheadedness (see above).  PT continues to provide cueing to maintain big steps and armswing with turning.  PT provides tactile cue to left upper extremity to improve arm swing.  Patient ambulates 1x180 ft (incorporated into hierarchy task as well where patient ambulates over compliant surface see above).    Carryover Assignment: Pt instructed to focus on walking big at home or outside of PT focusing on big L arm-swing and big L step.   Instructed pt to discontinue performing exercises if she feels dizzy. Pt and spouse verbalized understanding. PT ambulates with pt back to pt lobby  where pt is to be assisted back to car in transport chair d/t fatigue, dizziness.    Homework: Pt to perform all 7 LSVT BIG exercises (adapted and seated for 5 and 7 see above), 5 Functional Component Tasks and big walking.  Reinforced patient in safety with adapted and seated maximal daily exercises.  PT continues to reinforce the following safety instructions from prior sessions. PT instructed patient on use of upper extremity support with exercises.  Patient and spouse verbalized understanding. Updated and instructed pt in carryover task.     Pt educated throughout session about proper posture and technique with exercises. Improved exercise technique, movement at target joints, use of target muscles after min to mod verbal,  visual, tactile cues.      PT Education - 07/24/21 0804     Education provided Yes    Education Details continued cuing with all LSVT BIG exercises, body mechanics with gait    Person(s) Educated Patient    Methods Explanation;Demonstration;Tactile cues;Verbal cues;Handout    Comprehension Verbalized understanding;Returned demonstration;Verbal cues required;Tactile cues required;Need further instruction              PT Short Term Goals - 07/10/21 1323       PT SHORT TERM GOAL #1   Title Pt will demonstrate independence with LSVT BIG HEP to improve strength/mobility for improved safety and ease with ADLs and to maintain/continue gains beyond LSVT program.    Baseline 9/7: to be initiated next session    Time 2    Period  Weeks    Status New    Target Date 07/23/21               PT Long Term Goals - 07/10/21 1325       PT LONG TERM GOAL #1   Title Patient will increase FOTO score to equal to or greater than 47  to demonstrate statistically significant improvement in mobility and quality of life.    Baseline 9/7: 34    Time 5    Period Weeks    Status New    Target Date 08/13/21      PT LONG TERM GOAL #2   Title Patient (> 22 years old) will complete five times sit to stand test in < 15 seconds indicating decreased fall risk.    Baseline 9/7: 18.5 sec    Time 5    Period Weeks    Status New    Target Date 08/13/21      PT LONG TERM GOAL #3   Title Patient will increase 10 meter walk test to >1.33m/s with WFL B step-length for 100% of test to improve ease with gait and to override hypokentic and bradykinetic movement.    Baseline 9/7: 0.74 m/s, decreased B step length and rigidty of movement    Time 5    Period Weeks    Status New    Target Date 08/13/21      PT LONG TERM GOAL #4   Title Patient will reduce timed up and go to <11 seconds to reduce fall risk and demonstrate improved transfer/gait ability and ability to perform turns safely    Baseline 9/7: 17  seconds    Time 5    Period Weeks    Status New    Target Date 08/13/21      PT LONG TERM GOAL #5   Title Pt will demo or report ability to open standard jar using only 1-2 attempts in order to exhibit improved functional mobility for ADLs.    Baseline 9/7: Pt reports she is unable to open jars    Time 5    Period Weeks    Status New    Target Date 08/13/21                   Plan - 07/24/21 0805     Clinical Impression Statement Pt was not able to progress volume of reps with maximal daily exercises beyond first two d/t increased fatigue and back and leg pain today (not new). Pt had taken pain medication prior to session, but it had not taken effect yet. Pt requires frequent rest breaks. However, pt was able to demo a few reps of seated backward step and reach independently after cuing. Gait training had to be cut short d/t pt reports of dizziness. PT instructed pt and her husband for pt not to perform her HEP when she is feeling dizzy. Pt and her spouse verbalized understanding. The pt will benefit from further LSVT BIG PT treatments to continue to improve general mobility, strength, balance and gait to increase QOL and decrease fall risk.    Personal Factors and Comorbidities Age;Sex;Fitness;Time since onset of injury/illness/exacerbation;Comorbidity 1;Comorbidity 3+;Comorbidity 2    Comorbidities MCI, HTN, CA, cataracts, bipolar 1, GAD, insomnia, chronic LBP    Examination-Activity Limitations Bend;Sit;Squat;Stairs;Stand;Locomotion Level;Self Feeding;Bathing;Transfers;Lift;Bed Mobility    Examination-Participation Restrictions Cleaning;Community Activity;Shop;Yard Work;Meal Prep;Personal Finances;Driving    Stability/Clinical Decision Making Evolving/Moderate complexity    Rehab Potential Fair    Clinical Impairments Affecting  Rehab Potential MCI, chronic back pain    PT Frequency 4x / week    PT Duration Other (comment)   5 weeks   PT Treatment/Interventions ADLs/Self Care  Home Management;Therapeutic exercise;Patient/family education;Neuromuscular re-education;Therapeutic activities;Functional mobility training;Balance training;Gait training;Stair training;Taping;Energy conservation;Passive range of motion;DME Instruction;Cognitive remediation    PT Next Visit Plan Initiate LSVT BIG Maximal daily exercises (see note for adaptions), functional component tasks, hierarchy tasks, carryover tasks, big gait/walking    PT Home Exercise Plan LSVT 7 adapted maximal daily exercises (exercies 5 and 7 performed with seated versions), 5 functional component tasks, hierarchy tasks, big walking, and carryover task (updated 9/21)    Consulted and Agree with Plan of Care Patient;Family member/caregiver    Family Member Consulted spouse             Patient will benefit from skilled therapeutic intervention in order to improve the following deficits and impairments:  Abnormal gait, Decreased activity tolerance, Decreased cognition, Decreased endurance, Decreased knowledge of use of DME, Decreased range of motion, Decreased strength, Hypomobility, Dizziness, Pain, Improper body mechanics, Impaired UE functional use, Decreased balance, Decreased coordination, Decreased mobility, Difficulty walking, Increased edema, Impaired flexibility, Increased muscle spasms, Postural dysfunction  Visit Diagnosis: Other abnormalities of gait and mobility  Unsteadiness on feet  Other lack of coordination  Muscle weakness (generalized)  Parkinson disease ([place])     Problem List Patient Active Problem List   Diagnosis Date Noted   Chronic bilateral low back pain 06/18/2021   Elevated blood pressure reading 05/23/2021   Osteopenia of neck of right femur 09/06/2020   Obesity 08/29/2020   Parkinson disease ([place]) 02/22/2020   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   Bipolar disorder, in full remission, most recent episode depressed ([place]) 11/08/2019   Avitaminosis D 08/24/2019   Insomnia due to  mental condition 05/31/2019   Bipolar I disorder, most recent episode depressed ([place]) 05/31/2019   Alcohol use disorder, moderate, in sustained remission ([place]) 05/31/2019   Elevated tumor markers 05/27/2019   GAD (generalized anxiety disorder) 04/10/2019   MDD (major depressive disorder), severe ([place]) 01/31/2019   High serum carbohydrate antigen 19-9 (CA19-9) 12/01/2018   Adjustment disorder with anxious mood 11/24/2018   Essential hypertension 11/24/2018   B12 deficiency 10/17/2018   Incisional hernia, without obstruction or gangrene 09/12/2018   Genetic testing 04/07/2018   Malignant neoplasm of upper-outer quadrant of right breast in female, estrogen receptor positive ([place]) 03/17/2018   Malignant neoplasm of upper-inner quadrant of right breast in female, estrogen receptor positive ([place]) 03/17/2018   History of psychosis 02/07/2018   History of insomnia 02/07/2018    Zollie Pee, PT 07/24/2021, 8:20 AM  [place] MAIN [facility] SERVICES [place], [place], [address] Phone: [phone]   Fax:  [phone]  Name: Shandi Godfrey MRN: [id number] Date of Birth: Apr 27, 1946

## 2021-07-23 NOTE — Therapy (Signed)
[name] [facility] SERVICES [address] [place], [place], [address] Phone: [phone]   Fax:  [phone]  Speech Language Pathology Treatment  Patient Details  Name: Kendra [name] MRN: [id number] Date of Birth: 1946-10-03 Referring Provider (SLP): Jerrol Banana., MD   Encounter Date: 07/23/2021   End of Session - 07/23/21 1339     Visit Number 8    Number of Visits 17    Date for SLP Re-Evaluation 10/07/21    SLP Start Time 1006   arrived late   SLP Stop Time  40    SLP Time Calculation (min) 54 min    Activity Tolerance Patient tolerated treatment well             Past Medical History:  Diagnosis Date   Anxiety    Breast cancer (Start) 03/2018   right breast cancer at 11:00 and 1:00   Bursitis    bilateral hips and knees   Complication of anesthesia    Depression    Family history of breast cancer 03/24/2018   Family history of lung cancer 03/24/2018   History of alcohol dependence ([place]) 2007   no ETOH; resolved since 2007   History of hiatal hernia    History of psychosis 2014   due to sleep disturbance   Hypertension    Parkinson disease ([place])    Personal history of radiation therapy 06/2018-07/2018   right breast ca   PONV (postoperative nausea and vomiting)     Past Surgical History:  Procedure Laterality Date   BREAST BIOPSY Right 03/14/2018   11:00 DCIS and invasive ductal carcinoma   BREAST BIOPSY Right 03/14/2018   1:00 Invasive ductal carcinoma   BREAST LUMPECTOMY Right 04/27/2018   lumpectomy of 11 and 1:00 cancers, clear margins, negative LN   BREAST LUMPECTOMY WITH RADIOACTIVE SEED AND SENTINEL LYMPH NODE BIOPSY Right 04/27/2018   Procedure: RIGHT BREAST LUMPECTOMY WITH RADIOACTIVE SEED X 2 AND RIGHT SENTINEL LYMPH NODE BIOPSY;  Surgeon: [name], MD;  Location: [place];  Service: General;  Laterality: Right;   CATARACT EXTRACTION W/PHACO Left 08/30/2019    Procedure: CATARACT EXTRACTION PHACO AND INTRAOCULAR LENS PLACEMENT ([place]) LEFT panoptix toric  01:20.5  12.7%  10.26;  Surgeon: Kendra Koyanagi, MD;  Location: [place];  Service: Ophthalmology;  Laterality: Left;   CATARACT EXTRACTION W/PHACO Right 09/20/2019   Procedure: CATARACT EXTRACTION PHACO AND INTRAOCULAR LENS PLACEMENT ([facility]) RIGHT 5.71  00:36.4  15.7%;  Surgeon: Kendra Koyanagi, MD;  Location: [place];  Service: Ophthalmology;  Laterality: Right;   LAPAROTOMY N/A 02/10/2018   Procedure: EXPLORATORY LAPAROTOMY;  Surgeon: Kendra Caprice, MD;  Location: [facility];  Service: Gynecology;  Laterality: N/A;   MASS EXCISION  01/2018   abdominal   OVARIAN CYST REMOVAL     SALPINGOOPHORECTOMY Bilateral 02/10/2018   Procedure: BILATERAL SALPINGO OOPHORECTOMY; PERITONEAL WASHINGS;  Surgeon: Kendra Caprice, MD;  Location: [facility];  Service: Gynecology;  Laterality: Bilateral;   TONSILLECTOMY     TONSILLECTOMY AND ADENOIDECTOMY  1953    There were no vitals filed for this visit.   Subjective Assessment - 07/23/21 1339     Subjective Cues necessary to use loud voice greating SLP    Patient is accompained by: Family member    Currently in Pain? No/denies                   ADULT SLP TREATMENT - 07/23/21 1339  General Information   Behavior/Cognition Alert;Cooperative    HPI Patient is a 75 y.o. female with history of Parkinson's disease, anxiety, depression, psychosis, HTN. bipolar disorder, alcoholism, memory issues since 2005, hx head trauma. Referred for LSVT BIG and LOUD.      Cognitive-Linquistic Treatment   Treatment focused on Dysarthria;Patient/family/caregiver education    Skilled Treatment LSVT Daily tasks with occasional min cues. Loud /a/ averaged 83 dB, duration 17.3 seconds, pitch avg 232 Hz.  Max F0 for pitch glides was 293 Hz (84dB avg), min F0 for low glides was 186 Hz (83 dB avg). Functional phrases averaged 84 dB with modified  independence. Hierarchical loudness drills at short sentence response level averaged 81 dB, with mod cues for loudness.        Assessment / Recommendations / Plan   Plan Continue with current plan of care      Progression Toward Goals   Progression toward goals Progressing toward goals                SLP Short Term Goals - 07/10/21 1936       SLP SHORT TERM GOAL #1   Title The patient will complete Daily Tasks (Maximum duration "ah", High/Lows, and Functional Phrases) at average loudness >/= 85 dB and with loud, good quality voice with min cues.    Time 10    Period --   sessions   Status New      SLP SHORT TERM GOAL #2   Title The patient will complete Hierarchal Speech Loudness reading drills (words/phrases, sentences) at average >/= 75 dB and with loud, good quality voice with min cues.    Time 10    Period --   sessions   Status New      SLP SHORT TERM GOAL #3   Title The patient will participate in 5-8 minutes conversation, maintaining average loudness of 75 dB and loud, good quality voice with min cues.    Time 10    Period --   sessions   Status New              SLP Long Term Goals - 07/10/21 1937       SLP LONG TERM GOAL #1   Title The patient will complete Daily Tasks (Maximum duration "ah", High/Lows, and Functional Phrases) at average loudness >/= 85 dB and with loud, good quality voice    Time 4    Period Weeks   or 17 sessions, for all LTGs   Status New    Target Date 08/10/21      SLP LONG TERM GOAL #2   Title The patient will complete Hierarchal Speech Loudness reading drills (words/phrases, sentences, and paragraph) at average >/= 75 dB and with loud, good quality voice.    Time 4    Period Weeks    Status New    Target Date 08/10/21      SLP LONG TERM GOAL #3   Title The patient will participate in 15-20 minutes conversation, maintaining average loudness of >/= 75 dB and loud, good quality voice.    Time 4    Period Weeks    Status New     Target Date 08/10/21      SLP LONG TERM GOAL #4   Title Patient will report improved communication effectiveness as measured by Communicative Effectiveness Survey.    Baseline 19/32 at eval    Time 4    Period Weeks    Status New  Target Date 08/10/21              Plan - 07/23/21 1340     Clinical Impression Statement Patient presents with mild hypokinetic dysarthria characterized by reduced vocal intensity, short rushes of speech/variable rate, and hoarse vocal quality. Progressing to 1-2 sentence tasks with cognitive load, demonstrating improving awareness/self-correction. Cues remain necessary for carryover in conversation. I recommend skilled ST for LSVT LOUD 4x a week for 4 weeks in order to improve vocal quality, intelligibility, and communicative effectiveness.    Speech Therapy Frequency 4x / week    Duration 4 weeks    Treatment/Interventions Language facilitation;Environmental controls;Cueing hierarchy;SLP instruction and feedback;Compensatory techniques;Functional tasks;Patient/family education;Multimodal communcation approach   LSVT LOUD   Potential to Achieve Goals Good    Potential Considerations Ability to learn/carryover information    SLP Home Exercise Plan to be provided next session    Consulted and Agree with Plan of Care Patient;Family member/caregiver    Family Member Consulted spouse             Patient will benefit from skilled therapeutic intervention in order to improve the following deficits and impairments:   Dysarthria and anarthria  Parkinson disease ([place])    Problem List Patient Active Problem List   Diagnosis Date Noted   Chronic bilateral low back pain 06/18/2021   Elevated blood pressure reading 05/23/2021   Osteopenia of neck of right femur 09/06/2020   Obesity 08/29/2020   Parkinson disease ([place]) 02/22/2020   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   Bipolar disorder, in full remission, most recent episode depressed ([place]) 11/08/2019    Avitaminosis D 08/24/2019   Insomnia due to mental condition 05/31/2019   Bipolar I disorder, most recent episode depressed ([place]) 05/31/2019   Alcohol use disorder, moderate, in sustained remission ([place]) 05/31/2019   Elevated tumor markers 05/27/2019   GAD (generalized anxiety disorder) 04/10/2019   MDD (major depressive disorder), severe ([place]) 01/31/2019   High serum carbohydrate antigen 19-9 (CA19-9) 12/01/2018   Adjustment disorder with anxious mood 11/24/2018   Essential hypertension 11/24/2018   B12 deficiency 10/17/2018   Incisional hernia, without obstruction or gangrene 09/12/2018   Genetic testing 04/07/2018   Malignant neoplasm of upper-outer quadrant of right breast in female, estrogen receptor positive ([place]) 03/17/2018   Malignant neoplasm of upper-inner quadrant of right breast in female, estrogen receptor positive ([place]) 03/17/2018   History of psychosis 02/07/2018   History of insomnia 02/07/2018   Deneise Lever, [place], CCC-SLP Speech-Language Pathologist  Aliene Altes 07/23/2021, 1:40 PM  [place] [address] [place], [place], [address] Phone: [phone]   Fax:  [phone]   Name: Desirie Minteer MRN: [id number] Date of Birth: Mar 13, 1946

## 2021-07-24 ENCOUNTER — Ambulatory Visit: Payer: Medicare PPO

## 2021-07-24 ENCOUNTER — Encounter: Payer: Medicare PPO | Admitting: Speech Pathology

## 2021-07-24 DIAGNOSIS — G20 Parkinson's disease: Secondary | ICD-10-CM | POA: Diagnosis not present

## 2021-07-24 DIAGNOSIS — M6281 Muscle weakness (generalized): Secondary | ICD-10-CM | POA: Diagnosis not present

## 2021-07-24 DIAGNOSIS — R278 Other lack of coordination: Secondary | ICD-10-CM

## 2021-07-24 DIAGNOSIS — R471 Dysarthria and anarthria: Secondary | ICD-10-CM | POA: Diagnosis not present

## 2021-07-24 DIAGNOSIS — M79604 Pain in right leg: Secondary | ICD-10-CM | POA: Diagnosis not present

## 2021-07-24 DIAGNOSIS — R2689 Other abnormalities of gait and mobility: Secondary | ICD-10-CM | POA: Diagnosis not present

## 2021-07-24 DIAGNOSIS — R2681 Unsteadiness on feet: Secondary | ICD-10-CM | POA: Diagnosis not present

## 2021-07-24 DIAGNOSIS — M79605 Pain in left leg: Secondary | ICD-10-CM | POA: Diagnosis not present

## 2021-07-24 NOTE — Therapy (Signed)
[place] MAIN [facility] SERVICES [address] [place], [place], [address] Phone: [phone]   Fax:  [phone]  Physical Therapy Treatment  Patient Details  Name: Kendra [name] MRN: [id number] Date of Birth: 02-18-1946 Referring Provider (PT): Arbutus Ped., MD   Encounter Date: 07/24/2021   PT End of Session - 07/25/21 0924     Visit Number 9    Number of Visits 17    Date for PT Re-Evaluation 08/13/21    Authorization Type LSVT BIG    Authorization Time Period Evaluation performed on 07/09/2021    PT Start Time 1011    PT Stop Time 1100    PT Time Calculation (min) 49 min    Equipment Utilized During Treatment Gait belt    Activity Tolerance Patient tolerated treatment well;Patient limited by fatigue    Behavior During Therapy [facility] for tasks assessed/performed             Past Medical History:  Diagnosis Date   Anxiety    Breast cancer ([place]) 03/2018   right breast cancer at 11:00 and 1:00   Bursitis    bilateral hips and knees   Complication of anesthesia    Depression    Family history of breast cancer 03/24/2018   Family history of lung cancer 03/24/2018   History of alcohol dependence ([place]) 2007   no ETOH; resolved since 2007   History of hiatal hernia    History of psychosis 2014   due to sleep disturbance   Hypertension    Parkinson disease ([place])    Personal history of radiation therapy 06/2018-07/2018   right breast ca   PONV (postoperative nausea and vomiting)     Past Surgical History:  Procedure Laterality Date   BREAST BIOPSY Right 03/14/2018   11:00 DCIS and invasive ductal carcinoma   BREAST BIOPSY Right 03/14/2018   1:00 Invasive ductal carcinoma   BREAST LUMPECTOMY Right 04/27/2018   lumpectomy of 11 and 1:00 cancers, clear margins, negative LN   BREAST LUMPECTOMY WITH RADIOACTIVE SEED AND SENTINEL LYMPH NODE BIOPSY Right 04/27/2018   Procedure: RIGHT BREAST LUMPECTOMY WITH RADIOACTIVE SEED X 2  AND RIGHT SENTINEL LYMPH NODE BIOPSY;  Surgeon: [name], MD;  Location: [facility];  Service: General;  Laterality: Right;   CATARACT EXTRACTION W/PHACO Left 08/30/2019   Procedure: CATARACT EXTRACTION PHACO AND INTRAOCULAR LENS PLACEMENT ([place]) LEFT panoptix toric  01:20.5  12.7%  10.26;  Surgeon: Leandrew Koyanagi, MD;  Location: [facility];  Service: Ophthalmology;  Laterality: Left;   CATARACT EXTRACTION W/PHACO Right 09/20/2019   Procedure: CATARACT EXTRACTION PHACO AND INTRAOCULAR LENS PLACEMENT ([facility]) RIGHT 5.71  00:36.4  15.7%;  Surgeon: Leandrew Koyanagi, MD;  Location: [facility];  Service: Ophthalmology;  Laterality: Right;   LAPAROTOMY N/A 02/10/2018   Procedure: EXPLORATORY LAPAROTOMY;  Surgeon: Isabel Caprice, MD;  Location: [facility];  Service: Gynecology;  Laterality: N/A;   MASS EXCISION  01/2018   abdominal   OVARIAN CYST REMOVAL     SALPINGOOPHORECTOMY Bilateral 02/10/2018   Procedure: BILATERAL SALPINGO OOPHORECTOMY; PERITONEAL WASHINGS;  Surgeon: Isabel Caprice, MD;  Location: [facility];  Service: Gynecology;  Laterality: Bilateral;   TONSILLECTOMY     TONSILLECTOMY AND ADENOIDECTOMY  1953    There were no vitals filed for this visit.   Subjective Assessment - 07/24/21 1013     Subjective Pt reports she fell last night when she tried to go to the bathroom. She  says she hit her head. She says her neck is feeling stiff. She reports no headache. She denies nausea. She reports she has some bruises on her arm from fall. Denies losing consciousness. She reports she has not contacted her doctor. She says other than some neck stiffness she feels ok.    Patient is accompained by: Family member   spouse, [name]   Pertinent History Pt presents to PT for LSVT BIG evaluation. Pt diagnosed with PD in 2019. Pt reports difficulty with gait speed, freezing, transfers, balance, cognition, and use of hands d/t tremors. Pt has previously attended PT  for treatment of back pain. Per chart other PMH includes hx of breast cancer, HTN, MCI, insomnia, cataracts, bipolar 1, alcohol use disorder, GAD, MDD, osteopenia of neck of R femur, chronic B low back pain with radiating pain to buttock and posterior thighs and calves    Limitations Sitting;Standing;Walking;Reading;Lifting;Writing;House hold activities    How long can you sit comfortably? limited if not well positioned. Feet must be propped up.    How long can you stand comfortably? not comfortable, pain is constant    How long can you walk comfortably? 5 min    Diagnostic tests No recent imaging pertinent to PD per chart. Other most recent imaging per Chancis, Juanda Crumble note 06/27/2021: 'MRI lumbar spine without contrast from [facility] dated 01/31/2020 imaging and report reviewed today. L5-S1 central disc bulging with severe bilateral facet arthropathy no central or foraminal stenosis. L4-5 grade 1 anterolisthesis with severe bilateral facet arthropathy and ligamentum flavum hypertrophy. She has severe right and moderate to severe left foraminal stenosis. Moderate central stenosis. L3-4 broad-based disc bulging with severe bilateral facet arthropathy and ligamentum flavum hypertrophy. Severe central stenosis. Moderate severe subarticular stenosis. No foraminal stenosis. L2-3 diffuse disc bulging with severe bilateral facet arthropathy and ligamentum flavum hypertrophy. Severe central stenosis and severe bilateral subarticular recess stenosis. Moderate left and mild to moderate right foraminal stenosis. L1-2 broad-based disc bulging with mild bilateral facet arthropathy. No central or foraminal stenosis."    Patient Stated Goals Pt would like to be able to walk faster, get up from a chair more easily    Currently in Pain? Yes   usual back/leg pain (not related to fall)   Pain Onset More than a month ago    Pain Onset More than a month ago             LSVT BIG Treatment:   Patient seen for LSVT  Daily Session Maximal Daily Exercises for facilitation/coordination of movement Sustained movements are designed to rescale the amplitude of movement output for generalization to daily functional activities.   Adapted Maximal Daily Exercises: Floor to Ceiling 10 with 10 second hold; frequent cuing to LUE for abduction; pt rates hard  Side to Side 10 Bilateral with 10 second hold  Forward Step and Reach 10 Bilateral Sideways Step and Reach 10 Bilateral Backwards Step and Reach 10 Bilateral - modified to Seated Maximal Daily Exercise  Forwards Rock and Reach 10 Bilateral Sideways Rock and Reach 10 Bilateral - modified to Seated Maximal Daily exercise    Daily Exercise Comments: Pt performing exercises 5 and 7 seated.  PT continues to provide 70-80% cueing overall with maximal Daily exercises, largely d/t cognition. Pt does better with initial cue/demo followed by less frequent VC today.  Pt with no increase in pain or dizziness with exercises on this date. Reps were not able to be progressed d/t time limitations (pt 10 min late to  session).    Functional Component Tasks - performance and progress: Sit to Stand x 5 from standard chair with cuing for big reach, performing modified version where pt holds on to chair with one hand for initial bend/reach 2. Walk and turn 5x 3. Bend and reach 5x 4. Open Jar 5x  5. Step up on step 5x Comments: Pt most challenged with increasing/sustaining amplitude for bend and reach. She is able to demo BIG steps with more force with step-up exercises. Pt is consistently able to perform opening jar with correct technique. Pt with 2/5 BIG reps of walking and turning.    Hierarchy Tasks - performance and progress: Contact guard assist to min assist provided throughout unless otherwise noted for all upright tasks Going for a walk outside (uneven, varied surfaces); Pt not yet ready for outside ambulation, pt continues to ambulate around PT clinic gym and must navigate over  and on compliant surface as well as ambulate on firm surface. Pt performs with minimal distractions in clinic gym.  Requires close CGA due to postural instability. Improved performance overall from prior session. No dizziness with exercise.  2. Handwriting - signing name with addition of the day's date BIG; signature and date: continues to be consistently smooth, able to sustain BIG writing, able to scale back and maintain appropriately big writing within lines, maintains smoothness of signature, indicating improved motor control and overriding of hypokinetic movement. 3. Ascending/descending stairs; big walk up to stairs with big reach to handrails, upright posture and big step up onto first step; consistently sustains large amplitude movement with step. Pt must still provide cuing >90% of time for upright posture.   Gait - performance and progress: Continued use of close CGA due to postural instability (however, improved from prior session. Pt less fatigued today, reports no sx with gait. Gait training was limited though secondary to time constraints. Pt does 3 laps within main clinic area. PT continues to provide cueing to maintain big steps and armswing with turning, with tactile cue to left upper extremity to improve shoulder extension with arm-swing.     Carryover Assignment: Pt instructed to walk big in grocery store, with big posture, big reach to ice cream container, big effort placing container in shopping cart and opening container BIG once home.   PT instructs pt to seek immediate care and follow-up with her doctor should she experience worsening HA, and/or other sx like visual disturbance, nausea, increased pain, onset of muscle weakness etc. Instructed pt to follow-up with her doctor when experiencing a fall that involves hitting her head. Pt and spouse verbalize understanding.    Homework: Pt to perform all 7 LSVT BIG exercises (adapted and seated for 5 and 7 see above), 5 Functional  Component Tasks and big walking.  Reinforced patient in safety with adapted and seated maximal daily exercises.  PT continues to reinforce the following safety instructions from prior sessions. PT instructed patient on use of upper extremity support with exercises.  Patient and spouse verbalized understanding. Updated and instructed pt in carryover task.     Pt educated throughout session about proper posture and technique with exercises. Improved exercise technique, movement at target joints, use of target muscles after min to mod verbal, visual, tactile cues.    PT Education - 07/25/21 [phone]     Education provided Yes    Education Details body mechanics, exercise technique, gait mechanics    Person(s) Educated Patient    Methods Explanation;Demonstration;Tactile cues;Verbal cues;Handout    Comprehension  Verbalized understanding;Returned demonstration;Verbal cues required;Tactile cues required;Need further instruction              PT Short Term Goals - 07/10/21 1323       PT SHORT TERM GOAL #1   Title Pt will demonstrate independence with LSVT BIG HEP to improve strength/mobility for improved safety and ease with ADLs and to maintain/continue gains beyond LSVT program.    Baseline 9/7: to be initiated next session    Time 2    Period Weeks    Status New    Target Date 07/23/21               PT Long Term Goals - 07/10/21 1325       PT LONG TERM GOAL #1   Title Patient will increase FOTO score to equal to or greater than 47  to demonstrate statistically significant improvement in mobility and quality of life.    Baseline 9/7: 34    Time 5    Period Weeks    Status New    Target Date 08/13/21      PT LONG TERM GOAL #2   Title Patient (> 54 years old) will complete five times sit to stand test in < 15 seconds indicating decreased fall risk.    Baseline 9/7: 18.5 sec    Time 5    Period Weeks    Status New    Target Date 08/13/21      PT LONG TERM GOAL #3   Title  Patient will increase 10 meter walk test to >1.15m/s with WFL B step-length for 100% of test to improve ease with gait and to override hypokentic and bradykinetic movement.    Baseline 9/7: 0.74 m/s, decreased B step length and rigidty of movement    Time 5    Period Weeks    Status New    Target Date 08/13/21      PT LONG TERM GOAL #4   Title Patient will reduce timed up and go to <11 seconds to reduce fall risk and demonstrate improved transfer/gait ability and ability to perform turns safely    Baseline 9/7: 17 seconds    Time 5    Period Weeks    Status New    Target Date 08/13/21      PT LONG TERM GOAL #5   Title Pt will demo or report ability to open standard jar using only 1-2 attempts in order to exhibit improved functional mobility for ADLs.    Baseline 9/7: Pt reports she is unable to open jars    Time 5    Period Weeks    Status New    Target Date 08/13/21                Plan - 07/25/21 0935     Clinical Impression Statement Session slightly limited due to pt late arrival to appointment. Pt less fatigued today, with improved activity tolerance requiring fewer rest breaks. While pt does require 70-80% cuing with maximal daily exercises, she exhibits improved sequencing after initial cuing. Pt also consistently is sustaining bigger movement with functional componenet tasks. Pt's postural stability with ambulating around clinic and over compliant surfaces also improved, requiring only CGA from PT. The pt will benefit from further LSVT BIG PT treatments to improve mobility, gait, balance, and strength to increase ease and safety with ADLs.    Personal Factors and Comorbidities Age;Sex;Fitness;Time since onset of injury/illness/exacerbation;Comorbidity 1;Comorbidity 3+;Comorbidity 2    Comorbidities MCI,  HTN, CA, cataracts, bipolar 1, GAD, insomnia, chronic LBP    Examination-Activity Limitations Bend;Sit;Squat;Stairs;Stand;Locomotion Level;Self  Feeding;Bathing;Transfers;Lift;Bed Mobility    Examination-Participation Restrictions Cleaning;Community Activity;Shop;Yard Work;Meal Prep;Personal Finances;Driving    Stability/Clinical Decision Making Evolving/Moderate complexity    Rehab Potential Fair    Clinical Impairments Affecting Rehab Potential MCI, chronic back pain    PT Frequency 4x / week    PT Duration Other (comment)   5 weeks   PT Treatment/Interventions ADLs/Self Care Home Management;Therapeutic exercise;Patient/family education;Neuromuscular re-education;Therapeutic activities;Functional mobility training;Balance training;Gait training;Stair training;Taping;Energy conservation;Passive range of motion;DME Instruction;Cognitive remediation    PT Next Visit Plan Initiate LSVT BIG Maximal daily exercises (see note for adaptions), functional component tasks, hierarchy tasks, carryover tasks, big gait/walking    PT Home Exercise Plan LSVT 7 adapted maximal daily exercises (exercies 5 and 7 performed with seated versions), 5 functional component tasks, hierarchy tasks, big walking, and carryover task (updated 9/21)    Consulted and Agree with Plan of Care Patient;Family member/caregiver    Family Member Consulted spouse             Patient will benefit from skilled therapeutic intervention in order to improve the following deficits and impairments:  Abnormal gait, Decreased activity tolerance, Decreased cognition, Decreased endurance, Decreased knowledge of use of DME, Decreased range of motion, Decreased strength, Hypomobility, Dizziness, Pain, Improper body mechanics, Impaired UE functional use, Decreased balance, Decreased coordination, Decreased mobility, Difficulty walking, Increased edema, Impaired flexibility, Increased muscle spasms, Postural dysfunction  Visit Diagnosis: Other abnormalities of gait and mobility  Muscle weakness (generalized)  Unsteadiness on feet  Other lack of coordination  Parkinson disease  ([place])     Problem List Patient Active Problem List   Diagnosis Date Noted   Chronic bilateral low back pain 06/18/2021   Elevated blood pressure reading 05/23/2021   Osteopenia of neck of right femur 09/06/2020   Obesity 08/29/2020   Parkinson disease ([place]) 02/22/2020   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   Bipolar disorder, in full remission, most recent episode depressed ([place]) 11/08/2019   Avitaminosis D 08/24/2019   Insomnia due to mental condition 05/31/2019   Bipolar I disorder, most recent episode depressed ([place]) 05/31/2019   Alcohol use disorder, moderate, in sustained remission ([place]) 05/31/2019   Elevated tumor markers 05/27/2019   GAD (generalized anxiety disorder) 04/10/2019   MDD (major depressive disorder), severe ([place]) 01/31/2019   High serum carbohydrate antigen 19-9 (CA19-9) 12/01/2018   Adjustment disorder with anxious mood 11/24/2018   Essential hypertension 11/24/2018   B12 deficiency 10/17/2018   Incisional hernia, without obstruction or gangrene 09/12/2018   Genetic testing 04/07/2018   Malignant neoplasm of upper-outer quadrant of right breast in female, estrogen receptor positive ([place]) 03/17/2018   Malignant neoplasm of upper-inner quadrant of right breast in female, estrogen receptor positive ([place]) 03/17/2018   History of psychosis 02/07/2018   History of insomnia 02/07/2018    Zollie Pee, PT 07/25/2021, 9:40 AM  [place] MAIN [facility] SERVICES [address] [place], [place], [address] Phone: [phone]   Fax:  [phone]  Name: Kendra [name] MRN: [id number] Date of Birth: 1946-04-28

## 2021-07-25 ENCOUNTER — Other Ambulatory Visit: Payer: Self-pay

## 2021-07-25 ENCOUNTER — Ambulatory Visit (HOSPITAL_BASED_OUTPATIENT_CLINIC_OR_DEPARTMENT_OTHER): Payer: Medicare PPO | Admitting: Psychiatry

## 2021-07-25 ENCOUNTER — Encounter: Payer: Self-pay | Admitting: Psychiatry

## 2021-07-25 VITALS — BP 144/69 | HR 94 | Temp 97.9°F | Wt 215.4 lb

## 2021-07-25 DIAGNOSIS — F411 Generalized anxiety disorder: Secondary | ICD-10-CM

## 2021-07-25 DIAGNOSIS — R030 Elevated blood-pressure reading, without diagnosis of hypertension: Secondary | ICD-10-CM

## 2021-07-25 DIAGNOSIS — F3176 Bipolar disorder, in full remission, most recent episode depressed: Secondary | ICD-10-CM

## 2021-07-25 DIAGNOSIS — F5105 Insomnia due to other mental disorder: Secondary | ICD-10-CM | POA: Diagnosis not present

## 2021-07-25 MED ORDER — TRAZODONE HCL 100 MG PO TABS: 100.0000 mg | ORAL_TABLET | Freq: Every day | ORAL | 1 refills | Status: DC

## 2021-07-25 MED ORDER — RISPERIDONE 0.5 MG PO TABS: 0.5000 mg | ORAL_TABLET | ORAL | 1 refills | Status: DC

## 2021-07-25 NOTE — Progress Notes (Signed)
BH MD/PA/NP OP Progress Note  07/25/2021 12:27 PM Kendra [name]  MRN:  [id number]  Chief Complaint:  Chief Complaint   Follow-up    HPI: Kendra [name] is a 75 year old Caucasian female, married, lives in [place], has a history of bipolar disorder, GAD, insomnia, multifocal stage I right breast cancer status post treatment-lumpectomy, ovarian cyst removal, Parkinson's disease, bilateral hearing loss was evaluated in office today.  Collateral information obtained from husband-Kendra [name].  Patient appeared to be alert, oriented to person place time and situation.  She did have difficulty hearing due to the fact that she did not have her hearing aid with her today.  She appeared to be pleasant in session.  She reports she just completed her physical therapy for the day prior to coming into the session.  She reports overall she is making some progress with the therapy.  She has been trying to stay active at home however understands that she needs to work on that more.  Patient reports sleep as restless mostly because of the pain.  However the trazodone does help.  She is taking tramadol and Mobic for her pain.  Patient denies any significant depression or anxiety.  Patient denies any suicidality, homicidality or perceptual disturbances.  She is tolerating the lower dosage of risperidone well.  Denies side effects.  According to husband since being on the lower dosage he has not observed any worsening mood symptoms, bipolar symptoms.    Visit Diagnosis:    ICD-10-CM   1. Bipolar disorder, in full remission, most recent episode depressed ([place])  F31.76 risperiDONE (RISPERDAL) 0.5 MG tablet    2. GAD (generalized anxiety disorder)  F41.1     3. Insomnia due to mental condition  F51.05 traZODone (DESYREL) 100 MG tablet   mood    4. Elevated blood pressure reading  R03.0       Past Psychiatric History: Reviewed past psychiatric history from progress note on 03/01/2019.  Past trials of  risperidone, Prozac, Seroquel, Ativan  Past Medical History:  Past Medical History:  Diagnosis Date   Anxiety    Breast cancer ([place]) 03/2018   right breast cancer at 11:00 and 1:00   Bursitis    bilateral hips and knees   Complication of anesthesia    Depression    Family history of breast cancer 03/24/2018   Family history of lung cancer 03/24/2018   History of alcohol dependence ([place]) 2007   no ETOH; resolved since 2007   History of hiatal hernia    History of psychosis 2014   due to sleep disturbance   Hypertension    Parkinson disease ([place])    Personal history of radiation therapy 06/2018-07/2018   right breast ca   PONV (postoperative nausea and vomiting)     Past Surgical History:  Procedure Laterality Date   BREAST BIOPSY Right 03/14/2018   11:00 DCIS and invasive ductal carcinoma   BREAST BIOPSY Right 03/14/2018   1:00 Invasive ductal carcinoma   BREAST LUMPECTOMY Right 04/27/2018   lumpectomy of 11 and 1:00 cancers, clear margins, negative LN   BREAST LUMPECTOMY WITH RADIOACTIVE SEED AND SENTINEL LYMPH NODE BIOPSY Right 04/27/2018   Procedure: RIGHT BREAST LUMPECTOMY WITH RADIOACTIVE SEED X 2 AND RIGHT SENTINEL LYMPH NODE BIOPSY;  Surgeon: [name], MD;  Location: [facility];  Service: General;  Laterality: Right;   CATARACT EXTRACTION W/PHACO Left 08/30/2019   Procedure: CATARACT EXTRACTION PHACO AND INTRAOCULAR LENS PLACEMENT (IOC) LEFT panoptix toric  01:20.5  12.7%  10.26;  Surgeon: Leandrew Koyanagi, MD;  Location: [facility];  Service: Ophthalmology;  Laterality: Left;   CATARACT EXTRACTION W/PHACO Right 09/20/2019   Procedure: CATARACT EXTRACTION PHACO AND INTRAOCULAR LENS PLACEMENT (IOC) RIGHT 5.71  00:36.4  15.7%;  Surgeon: Leandrew Koyanagi, MD;  Location: [facility];  Service: Ophthalmology;  Laterality: Right;   LAPAROTOMY N/A 02/10/2018   Procedure: EXPLORATORY LAPAROTOMY;  Surgeon: Isabel Caprice, MD;   [facility];  Service: Gynecology;  Laterality: N/A;   MASS EXCISION  01/2018   abdominal   OVARIAN CYST REMOVAL     SALPINGOOPHORECTOMY Bilateral 02/10/2018   Procedure: BILATERAL SALPINGO OOPHORECTOMY; PERITONEAL WASHINGS;  Surgeon: Isabel Caprice, MD;  [facility];  Service: Gynecology;  Laterality: Bilateral;   TONSILLECTOMY     TONSILLECTOMY AND ADENOIDECTOMY  1953    Family Psychiatric History: Reviewed family psychiatric history from progress note on 03/01/2019  Family History:  Family History  Problem Relation Age of Onset   Diabetes Mother    Breast cancer Mother 61   Hypertension Mother    Lung cancer Father        asbestos exposure   Heart disease Brother 28   Breast cancer Cousin 82       paternal cousin   Heart disease Paternal Grandmother 81   Heart disease Paternal Grandfather     Social History: Reviewed social history from progress note on 03/01/2019 Social History   Socioeconomic History   Marital status: Married    Spouse name: Kendra [name]   Number of children: 0   Years of education: Not on file   Highest education level: Some college, no degree  Occupational History   Occupation: retired Furniture conservator/restorer of education  Tobacco Use   Smoking status: Never   Smokeless tobacco: Never  Scientific laboratory technician Use: Never used  Substance and Sexual Activity   Alcohol use: Not Currently    Comment: alcohol dependence prior to 2007   Drug use: Never   Sexual activity: Yes    Partners: Male    Birth control/protection: Surgical  Other Topics Concern   Not on file  Social History Narrative   Not on file   Social Determinants of Health   Financial Resource Strain: Low Risk    Difficulty of Paying Living Expenses: Not hard at all  Food Insecurity: No Food Insecurity   Worried About Charity fundraiser in the Last Year: Never true   [place] in the Last Year: Never true  Transportation Needs: No Transportation Needs   Lack of  Transportation (Medical): No   Lack of Transportation (Non-Medical): No  Physical Activity: Inactive   Days of Exercise per Week: 0 days   Minutes of Exercise per Session: 0 min  Stress: Stress Concern Present   Feeling of Stress : To some extent  Social Connections: Moderately Integrated   Frequency of Communication with Friends and Family: More than three times a week   Frequency of Social Gatherings with Friends and Family: More than three times a week   Attends Religious Services: Never   Marine scientist or Organizations: Yes   Attends Archivist Meetings: 1 to 4 times per year   Marital Status: Married    Allergies:  Allergies  Allergen Reactions   Hydroxyzine Anaphylaxis    Tongue swollen   Other Other (See Comments)    Food poisoning    Shrimp [Shellfish Allergy] Other (See Comments)  Food poisoning     Metabolic Disorder Labs: Lab Results  Component Value Date   HGBA1C 5.4 04/13/2019   Lab Results  Component Value Date   PROLACTIN 101.9 06/20/2015   Lab Results  Component Value Date   CHOL 185 02/27/2021   TRIG 141 02/27/2021   HDL 59 02/27/2021   CHOLHDL 3.1 02/27/2021   LDLCALC 101 (H) 02/27/2021   LDLCALC 95 04/13/2019   Lab Results  Component Value Date   TSH 2.31 04/13/2019   TSH 2.543 07/25/2018    Therapeutic Level Labs: No results found for: LITHIUM No results found for: VALPROATE No components found for:  CBMZ  Current Medications: Current Outpatient Medications  Medication Sig Dispense Refill   Acetaminophen 500 MG capsule Take 1,000 mg by mouth every 6 (six) hours as needed.      carbidopa-levodopa (SINEMET CR) 50-200 MG tablet Take by mouth 4 (four) times daily.     carbidopa-levodopa (SINEMET IR) 25-100 MG tablet Take 1 tablet by mouth at bedtime.      cyanocobalamin (,VITAMIN B-12,) 1000 MCG/ML injection INJECT 1 ML INTO THE MUSCLE EVERY 30 DAYS 3 mL 3   denosumab (PROLIA) 60 MG/ML SOSY injection Inject 60 mg into  the skin every 6 (six) months. takes annually     diclofenac Sodium (VOLTAREN) 1 % GEL Apply 4 g topically 4 (four) times daily as needed. 100 g 0   FLUoxetine HCl 60 MG TABS TAKE ONE TABLET BY MOUTH DAILY 90 tablet 1   gabapentin (NEURONTIN) 100 MG capsule Take 3 capsules (300 mg total) by mouth 3 (three) times daily. 280 capsule 0   gabapentin (NEURONTIN) 100 MG capsule Take 1 capsule (100 mg total) by mouth at bedtime. 30 capsule 0   letrozole (FEMARA) 2.5 MG tablet Take 1 tablet (2.5 mg total) by mouth daily. 90 tablet 3   lisinopril-hydrochlorothiazide (ZESTORETIC) 20-12.5 MG tablet Take 1 tablet by mouth daily. 90 tablet 3   meloxicam (MOBIC) 15 MG tablet TAKE ONE TABLET BY MOUTH DAILY 30 tablet 0   NYAMYC powder APPLY TOPICALLY THREE TIMES A DAY 15 g 1   polyethylene glycol (MIRALAX / GLYCOLAX) 17 g packet Take 17 g by mouth daily.     risperiDONE (RISPERDAL) 0.5 MG tablet Take 1-2 tablets (0.5-1 mg total) by mouth as directed. Take 1 tablet daily in the AM and 2 tablets at bedtime 270 tablet 1   traMADol (ULTRAM) 50 MG tablet Take by mouth.     traZODone (DESYREL) 100 MG tablet Take 1 tablet (100 mg total) by mouth at bedtime. 90 tablet 1   Current Facility-Administered Medications  Medication Dose Route Frequency Provider Last Rate Last Admin   lidocaine (PF) (XYLOCAINE) 1 % injection 4 mL  4 mL Intradermal Once Bacigalupo, Dionne Bucy, MD       lidocaine (PF) (XYLOCAINE) 1 % injection 4 mL  4 mL Intradermal Once Bacigalupo, Dionne Bucy, MD         Musculoskeletal: Strength & Muscle Tone: abnormal, rigidity more on right upper extremity more of rt.sided UE Gait & Station: normal Patient leans: N/A  Psychiatric Specialty Exam: Review of Systems  HENT:  Positive for hearing loss.   Musculoskeletal:  Positive for back pain.  Neurological:  Positive for tremors.  Psychiatric/Behavioral:  The patient is nervous/anxious.   All other systems reviewed and are negative.  Blood pressure (!)  144/69, pulse 94, temperature 97.9 F (36.6 C), temperature source Temporal, weight 215 lb 6.4 oz (97.7  kg).Body mass index is 34.77 kg/m.  General Appearance: Casual  Eye Contact:  Fair  Speech:  Clear and Coherent  Volume:  Normal  Mood:  Anxious  Affect:  Congruent  Thought Process:  Goal Directed and Descriptions of Associations: Intact  Orientation:  Full (Time, Place, and Person)  Thought Content: Logical   Suicidal Thoughts:  No  Homicidal Thoughts:  No  Memory:  Immediate;   Fair Recent;   Fair Remote;   Limited  Judgement:  Fair  Insight:  Fair  Psychomotor Activity:  Normal  Concentration:  Concentration: Fair and Attention Span: Fair  Recall:  [employer] of Knowledge: Fair  Language: Fair  Akathisia:  No  Handed:  Right  AIMS (if indicated): done  Assets:  Communication Skills Desire for Improvement Housing Social Support  ADL's:  Intact  Cognition: WNL  Sleep:  Fair   Screenings: [place] Office Visit from 01/21/2021 in [facility] Total Score 0      [place] from 03/25/2021 in [facility] 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 [facility]  Total GAD-7 Score 4 7      PHQ2-9    Flowsheet Row Counselor from 06/10/2021 in [facility] from 05/23/2021 in [facility] from 05/15/2021 in [facility] from 03/25/2021 in [facility] Visit from 02/27/2021 in [facility]  PHQ-2 Total Score 1 1 1 3 2   PHQ-9 Total Score -- -- 9 14 8       Flowsheet Row Counselor from 06/10/2021 in [facility] from 05/23/2021 in [facility] Counselor from 03/25/2021 in [facility] No Risk No Risk  No Risk        Assessment and Plan: Amariyah Bazar is a 75 year old Caucasian female, married, lives in [place], has a history of bipolar disorder, GAD, insomnia, multiple medical problems including primary Parkinson's disease, hearing loss was evaluated in office today.  Patient with Parkinson's disease, chronic pain currently in physical therapy, but is tolerating the lower dosage of risperidone well with no worsening of her mood symptoms.  Discussed plan as noted below.  Plan Bipolar disorder in remission Risperidone 0.5 mg p.o. daily in the morning and 1 mg at night. Prozac 60 mg p.o. daily AIMS - 0  GAD-stable Continue CBT with Ms. Christina Hussami as needed Prozac 60 mg p.o. daily  Insomnia-stable Trazodone 50-100 mg p.o. nightly as needed Melatonin as needed She will need sufficient pain management  Elevated blood pressure reading-likely due to pain.  Patient will continue to need sufficient pain management.  Continue to follow up with primary care provider.  Collateral information obtained from husband Kendra [name].  Follow-up in clinic in 2 to 3 months or sooner if needed.  This note was generated in part or whole with voice recognition software. Voice recognition is usually quite accurate but there are transcription errors that can and very often do occur. I apologize for any typographical errors that were not detected and corrected.     Ursula Alert, MD 07/25/2021, 12:27 PM

## 2021-07-27 ENCOUNTER — Encounter: Payer: Self-pay | Admitting: Hematology and Oncology

## 2021-07-28 ENCOUNTER — Other Ambulatory Visit: Payer: Self-pay

## 2021-07-28 ENCOUNTER — Ambulatory Visit: Payer: Medicare PPO | Admitting: Speech Pathology

## 2021-07-28 ENCOUNTER — Ambulatory Visit: Payer: Medicare PPO

## 2021-07-28 DIAGNOSIS — G20 Parkinson's disease: Secondary | ICD-10-CM | POA: Diagnosis not present

## 2021-07-28 DIAGNOSIS — R2689 Other abnormalities of gait and mobility: Secondary | ICD-10-CM

## 2021-07-28 DIAGNOSIS — M79604 Pain in right leg: Secondary | ICD-10-CM | POA: Diagnosis not present

## 2021-07-28 DIAGNOSIS — R2681 Unsteadiness on feet: Secondary | ICD-10-CM | POA: Diagnosis not present

## 2021-07-28 DIAGNOSIS — M79605 Pain in left leg: Secondary | ICD-10-CM | POA: Diagnosis not present

## 2021-07-28 DIAGNOSIS — M6281 Muscle weakness (generalized): Secondary | ICD-10-CM | POA: Diagnosis not present

## 2021-07-28 DIAGNOSIS — R471 Dysarthria and anarthria: Secondary | ICD-10-CM

## 2021-07-28 DIAGNOSIS — R278 Other lack of coordination: Secondary | ICD-10-CM | POA: Diagnosis not present

## 2021-07-28 NOTE — Therapy (Signed)
[facility]IN [facility] SERVICES [address] [place], [place], [address] Phone: [phone]   Fax:  [phone]  Speech Language Pathology Treatment  Patient Details  Name: Kendra [name] MRN: [id number] Date of Birth: July 14, 1946 Referring Provider (SLP): Jerrol Banana., MD   Encounter Date: 07/28/2021   End of Session - 07/28/21 1237     Visit Number 9    Number of Visits 17    Date for SLP Re-Evaluation 10/07/21    SLP Start Time 1006   pt arrived late   SLP Stop Time  4    SLP Time Calculation (min) 54 min    Activity Tolerance Patient tolerated treatment well             Past Medical History:  Diagnosis Date   Anxiety    Breast cancer ([place]) 03/2018   right breast cancer at 11:00 and 1:00   Bursitis    bilateral hips and knees   Complication of anesthesia    Depression    Family history of breast cancer 03/24/2018   Family history of lung cancer 03/24/2018   History of alcohol dependence ([place]) 2007   no ETOH; resolved since 2007   History of hiatal hernia    History of psychosis 2014   due to sleep disturbance   Hypertension    Parkinson disease ([place])    Personal history of radiation therapy 06/2018-07/2018   right breast ca   PONV (postoperative nausea and vomiting)     Past Surgical History:  Procedure Laterality Date   BREAST BIOPSY Right 03/14/2018   11:00 DCIS and invasive ductal carcinoma   BREAST BIOPSY Right 03/14/2018   1:00 Invasive ductal carcinoma   BREAST LUMPECTOMY Right 04/27/2018   lumpectomy of 11 and 1:00 cancers, clear margins, negative LN   BREAST LUMPECTOMY WITH RADIOACTIVE SEED AND SENTINEL LYMPH NODE BIOPSY Right 04/27/2018   Procedure: RIGHT BREAST LUMPECTOMY WITH RADIOACTIVE SEED X 2 AND RIGHT SENTINEL LYMPH NODE BIOPSY;  Surgeon: [name], MD;  Location: [facility];  Service: General;  Laterality: Right;   CATARACT EXTRACTION W/PHACO Left 08/30/2019    Procedure: CATARACT EXTRACTION PHACO AND INTRAOCULAR LENS PLACEMENT ([place]) LEFT panoptix toric  01:20.5  12.7%  10.26;  Surgeon: Leandrew Koyanagi, MD;  Location: [facility];  Service: Ophthalmology;  Laterality: Left;   CATARACT EXTRACTION W/PHACO Right 09/20/2019   Procedure: CATARACT EXTRACTION PHACO AND INTRAOCULAR LENS PLACEMENT (IOC) RIGHT 5.71  00:36.4  15.7%;  Surgeon: Leandrew Koyanagi, MD;  Location: [facility];  Service: Ophthalmology;  Laterality: Right;   LAPAROTOMY N/A 02/10/2018   Procedure: EXPLORATORY LAPAROTOMY;  Surgeon: Isabel Caprice, MD;  [facility];  Service: Gynecology;  Laterality: N/A;   MASS EXCISION  01/2018   abdominal   OVARIAN CYST REMOVAL     SALPINGOOPHORECTOMY Bilateral 02/10/2018   Procedure: BILATERAL SALPINGO OOPHORECTOMY; PERITONEAL WASHINGS;  Surgeon: Isabel Caprice, MD;  [facility];  Service: Gynecology;  Laterality: Bilateral;   TONSILLECTOMY     TONSILLECTOMY AND ADENOIDECTOMY  1953    There were no vitals filed for this visit.   Subjective Assessment - 07/28/21 1236     Subjective Self-corrected loudness in waiting room    Currently in Pain? Yes    Pain Score 8     Pain Location --   generalized                  ADULT SLP TREATMENT -  07/28/21 1237       General Information   Behavior/Cognition Alert;Cooperative    HPI Patient is a 75 y.o. female with history of Parkinson's disease, anxiety, depression, psychosis, HTN. bipolar disorder, alcoholism, memory issues since 2005, hx head trauma. Referred for LSVT BIG and LOUD.      Treatment Provided   Treatment provided Cognitive-Linquistic      Cognitive-Linquistic Treatment   Treatment focused on Dysarthria;Patient/family/caregiver education    Skilled Treatment LSVT Daily tasks with occasional min cues. Loud /a/ averaged 83 dB, duration 17.2 seconds, pitch avg 240 Hz.  Max F0 for pitch glides was 287 Hz (83 dB avg), min F0 for low glides was 181  Hz (83 dB avg). Functional phrases averaged 82 dB with modified independence. Hierarchical loudness drills at paragraph level averaged 78 dB, with frequent mod cues for loudness.        Assessment / Recommendations / Plan   Plan Continue with current plan of care      Progression Toward Goals   Progression toward goals Progressing toward goals              SLP Education - 07/28/21 1237     Education Details new level home tasks    Person(s) Educated Patient    Methods Explanation    Comprehension Verbalized understanding              SLP Short Term Goals - 07/10/21 1936       SLP SHORT TERM GOAL #1   Title The patient will complete Daily Tasks (Maximum duration "ah", High/Lows, and Functional Phrases) at average loudness >/= 85 dB and with loud, good quality voice with min cues.    Time 10    Period --   sessions   Status New      SLP SHORT TERM GOAL #2   Title The patient will complete Hierarchal Speech Loudness reading drills (words/phrases, sentences) at average >/= 75 dB and with loud, good quality voice with min cues.    Time 10    Period --   sessions   Status New      SLP SHORT TERM GOAL #3   Title The patient will participate in 5-8 minutes conversation, maintaining average loudness of 75 dB and loud, good quality voice with min cues.    Time 10    Period --   sessions   Status New              SLP Long Term Goals - 07/10/21 1937       SLP LONG TERM GOAL #1   Title The patient will complete Daily Tasks (Maximum duration "ah", High/Lows, and Functional Phrases) at average loudness >/= 85 dB and with loud, good quality voice    Time 4    Period Weeks   or 17 sessions, for all LTGs   Status New    Target Date 08/10/21      SLP LONG TERM GOAL #2   Title The patient will complete Hierarchal Speech Loudness reading drills (words/phrases, sentences, and paragraph) at average >/= 75 dB and with loud, good quality voice.    Time 4    Period Weeks     Status New    Target Date 08/10/21      SLP LONG TERM GOAL #3   Title The patient will participate in 15-20 minutes conversation, maintaining average loudness of >/= 75 dB and loud, good quality voice.    Time 4  Period Weeks    Status New    Target Date 08/10/21      SLP LONG TERM GOAL #4   Title Patient will report improved communication effectiveness as measured by Communicative Effectiveness Survey.    Baseline 19/32 at eval    Time 4    Period Weeks    Status New    Target Date 08/10/21              Plan - 07/28/21 1238     Clinical Impression Statement Patient presents with mild hypokinetic dysarthria characterized by reduced vocal intensity, short rushes of speech/variable rate, and hoarse vocal quality. Progressed to paragraph level tasks today, with frequent verbal cues necessary to maintain vocal intensity. Cues remain necessary for carryover in conversation. I recommend skilled ST for LSVT LOUD 4x a week for 4 weeks in order to improve vocal quality, intelligibility, and communicative effectiveness.    Speech Therapy Frequency 4x / week    Duration 4 weeks    Treatment/Interventions Language facilitation;Environmental controls;Cueing hierarchy;SLP instruction and feedback;Compensatory techniques;Functional tasks;Patient/family education;Multimodal communcation approach   LSVT LOUD   Potential to Achieve Goals Good    Potential Considerations Ability to learn/carryover information    SLP Home Exercise Plan to be provided next session    Consulted and Agree with Plan of Care Patient;Family member/caregiver    Family Member Consulted spouse             Patient will benefit from skilled therapeutic intervention in order to improve the following deficits and impairments:   Dysarthria and anarthria  Parkinson disease ([place])    Problem List Patient Active Problem List   Diagnosis Date Noted   Chronic bilateral low back pain 06/18/2021   Elevated blood  pressure reading 05/23/2021   Osteopenia of neck of right femur 09/06/2020   Obesity 08/29/2020   Parkinson disease ([place]) 02/22/2020   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   Bipolar disorder, in full remission, most recent episode depressed ([place]) 11/08/2019   Avitaminosis D 08/24/2019   Insomnia due to mental condition 05/31/2019   Bipolar I disorder, most recent episode depressed ([place]) 05/31/2019   Alcohol use disorder, moderate, in sustained remission ([place]) 05/31/2019   Elevated tumor markers 05/27/2019   GAD (generalized anxiety disorder) 04/10/2019   MDD (major depressive disorder), severe ([place]) 01/31/2019   High serum carbohydrate antigen 19-9 (CA19-9) 12/01/2018   Adjustment disorder with anxious mood 11/24/2018   Essential hypertension 11/24/2018   B12 deficiency 10/17/2018   Incisional hernia, without obstruction or gangrene 09/12/2018   Genetic testing 04/07/2018   Malignant neoplasm of upper-outer quadrant of right breast in female, estrogen receptor positive ([place]) 03/17/2018   Malignant neoplasm of upper-inner quadrant of right breast in female, estrogen receptor positive ([place]) 03/17/2018   History of psychosis 02/07/2018   History of insomnia 02/07/2018   Deneise Lever, [place], CCC-SLP Speech-Language Pathologist   Aliene Altes 07/28/2021, 12:41 PM  [facility] [address] [place], [place], [address] Phone: [phone]   Fax:  [phone]   Name: Kendra [name] MRN: [id number] Date of Birth: Mar 12, 1946

## 2021-07-28 NOTE — Therapy (Signed)
[place] MAIN [facility] SERVICES [address] [place], [place], [address] Phone: [phone]   Fax:  [phone]  Physical Therapy Treatment/Physical Therapy Progress Note VISIT 10  Dates of reporting period  07/09/2021   to   07/28/2021   Patient Details  Name: Kendra [name] MRN: [id number] Date of Birth: [birth year] Referring Provider (PT): Arbutus Ped., MD   Encounter Date: 07/28/2021   PT End of Session - 07/28/21 1220     Visit Number 10    Number of Visits 17    Date for PT Re-Evaluation 08/13/21    Authorization Type LSVT BIG    Authorization Time Period Evaluation performed on 07/09/2021    PT Start Time 1106    PT Stop Time 1208    PT Time Calculation (min) 62 min    Equipment Utilized During Treatment Gait belt    Activity Tolerance Patient tolerated treatment well;Patient limited by fatigue    Behavior During Therapy [facility] for tasks assessed/performed             Past Medical History:  Diagnosis Date   Anxiety    Breast cancer ([place]) 03/2018   right breast cancer at 11:00 and 1:00   Bursitis    bilateral hips and knees   Complication of anesthesia    Depression    Family history of breast cancer 03/24/2018   Family history of lung cancer 03/24/2018   History of alcohol dependence ([place]) 2007   no ETOH; resolved since 2007   History of hiatal hernia    History of psychosis 2014   due to sleep disturbance   Hypertension    Parkinson disease ([place])    Personal history of radiation therapy 06/2018-07/2018   right breast ca   PONV (postoperative nausea and vomiting)     Past Surgical History:  Procedure Laterality Date   BREAST BIOPSY Right 03/14/2018   11:00 DCIS and invasive ductal carcinoma   BREAST BIOPSY Right 03/14/2018   1:00 Invasive ductal carcinoma   BREAST LUMPECTOMY Right 04/27/2018   lumpectomy of 11 and 1:00 cancers, clear margins, negative LN   BREAST LUMPECTOMY WITH RADIOACTIVE SEED AND  SENTINEL LYMPH NODE BIOPSY Right 04/27/2018   Procedure: RIGHT BREAST LUMPECTOMY WITH RADIOACTIVE SEED X 2 AND RIGHT SENTINEL LYMPH NODE BIOPSY;  Surgeon: [name], MD;  Location: [place];  Service: General;  Laterality: Right;   CATARACT EXTRACTION W/PHACO Left 08/30/2019   Procedure: CATARACT EXTRACTION PHACO AND INTRAOCULAR LENS PLACEMENT ([place]) LEFT panoptix toric  01:20.5  12.7%  10.26;  Surgeon: Leandrew Koyanagi, MD;  Location: [place];  Service: Ophthalmology;  Laterality: Left;   CATARACT EXTRACTION W/PHACO Right 09/20/2019   Procedure: CATARACT EXTRACTION PHACO AND INTRAOCULAR LENS PLACEMENT ([facility]) RIGHT 5.71  00:36.4  15.7%;  Surgeon: Leandrew Koyanagi, MD;  Location: [place];  Service: Ophthalmology;  Laterality: Right;   LAPAROTOMY N/A 02/10/2018   Procedure: EXPLORATORY LAPAROTOMY;  Surgeon: Isabel Caprice, MD;  Location: [facility];  Service: Gynecology;  Laterality: N/A;   MASS EXCISION  01/2018   abdominal   OVARIAN CYST REMOVAL     SALPINGOOPHORECTOMY Bilateral 02/10/2018   Procedure: BILATERAL SALPINGO OOPHORECTOMY; PERITONEAL WASHINGS;  Surgeon: Isabel Caprice, MD;  Location: [facility];  Service: Gynecology;  Laterality: Bilateral;   TONSILLECTOMY     TONSILLECTOMY AND ADENOIDECTOMY  1953    There were no vitals filed for this visit.    Subjective Assessment - 07/28/21 1219  Subjective Pt reports no falls since last appointment. She reports doing HEP but is unsure if she is always performing exercises with correct technique.    Patient is accompained by: Family member   spouse, [name]   Pertinent History Pt presents to PT for LSVT BIG evaluation. Pt diagnosed with PD in 2019. Pt reports difficulty with gait speed, freezing, transfers, balance, cognition, and use of hands d/t tremors. Pt has previously attended PT for treatment of back pain. Per chart other PMH includes hx of breast cancer, HTN, MCI, insomnia, cataracts,  bipolar 1, alcohol use disorder, GAD, MDD, osteopenia of neck of R femur, chronic B low back pain with radiating pain to buttock and posterior thighs and calves    Limitations Sitting;Standing;Walking;Reading;Lifting;Writing;House hold activities    How long can you sit comfortably? limited if not well positioned. Feet must be propped up.    How long can you stand comfortably? not comfortable, pain is constant    How long can you walk comfortably? 5 min    Diagnostic tests No recent imaging pertinent to PD per chart. Other most recent imaging per Chancis, Juanda Crumble note 06/27/2021: 'MRI lumbar spine without contrast from [facility] dated 01/31/2020 imaging and report reviewed today. L5-S1 central disc bulging with severe bilateral facet arthropathy no central or foraminal stenosis. L4-5 grade 1 anterolisthesis with severe bilateral facet arthropathy and ligamentum flavum hypertrophy. She has severe right and moderate to severe left foraminal stenosis. Moderate central stenosis. L3-4 broad-based disc bulging with severe bilateral facet arthropathy and ligamentum flavum hypertrophy. Severe central stenosis. Moderate severe subarticular stenosis. No foraminal stenosis. L2-3 diffuse disc bulging with severe bilateral facet arthropathy and ligamentum flavum hypertrophy. Severe central stenosis and severe bilateral subarticular recess stenosis. Moderate left and mild to moderate right foraminal stenosis. L1-2 broad-based disc bulging with mild bilateral facet arthropathy. No central or foraminal stenosis."    Patient Stated Goals Pt would like to be able to walk faster, get up from a chair more easily    Currently in Pain? Yes    Pain Onset More than a month ago    Pain Onset More than a month ago             LSVT BIG Treatment:   Patient seen for LSVT Daily Session Maximal Daily Exercises for facilitation/coordination of movement Sustained movements are designed to rescale the amplitude of movement  output for generalization to daily functional activities.   Adapted Maximal Daily Exercises: Floor to Ceiling 10 with 10 second hold; frequent cuing to LUE for abduction; pt rates hard  Side to Side 10 Bilateral with 10 second hold  Forward Step and Reach 12 Bilateral ; sat down following exercise as pt reports feeling light-headed. Sideways Step and Reach 10 Bilateral Backwards Step and Reach 12 Bilateral - modified to Seated Maximal Daily Exercise  Forwards Rock and Reach 12 Bilateral Sideways Rock and Reach 12 Bilateral - modified to Seated Maximal Daily exercise    Daily Exercise Comments: Pt performing exercises 5 and 7 seated.  PT overall provides 50% cueing for maximal Daily exercises, largely d/t cognition. Pt able to increase volume of reps with all exercises. Pt did require rest breaks d/t fatigue and reports of dizziness that resolved with rest.    Functional Component Tasks - performance and progress: Sit to Stand x 5 from standard chair with cuing for big reach, performing modified version where pt holds on to chair with one hand for initial bend/reach - TIMED for 5xSTS: 16.26  sec 2. Walk and turn 5x 3. Bend and reach 5x 4. Open Jar 5x  - goal met 5. Step up on step 5x Comments: Pt shows modest improvement with 5xSTS compared to eval. She continues to progress with large amplitude turns.   Hierarchy Tasks - performance and progress: Contact guard assist to min assist provided throughout unless otherwise noted for all upright tasks Going for a walk outside (uneven, varied surfaces); Pt not yet ready for outside ambulation, focus on ambulating over compliant surface as this triggers pts sx (bradykinesia, hypokinesia, shuffling). Pt with improved postural stability this session. 2. Handwriting - signing name "my name is ____ and today's date is ____" Pt continues to write BIG with smooth writing.  3. Ascending/descending stairs; big walk up to stairs with big reach to handrails,  upright posture and big ascend/descent down stairs. PT providing CGA throughout. No decrease in postural stability observed.    Gait - performance and progress: Continued use of close CGA due to postural instability. 2x148 ft. Pt only requires one rest break with gait this session. Continued TC provided for LUE shoulder-extension with arm-swing. Pt consistently increases arm-swing following TC. She requires <50% to big-steps.    Carryover Assignment: Pt instructed to walk big into home with big posture, big step form outside to inside, big sit down on couch, and later big stand up from couch.    Homework: Pt to perform all 7 LSVT BIG exercises (adapted and seated for 5 and 7 see above), 5 Functional Component Tasks and big walking.  Reinforced patient in safety with adapted and seated maximal daily exercises.  PT continues to reinforce the following safety instructions from prior sessions. PT instructed patient on use of upper extremity support with exercises.  Patient and spouse verbalized understanding. Updated and instructed pt in carryover task.     Pt educated throughout session about proper posture and technique with exercises. Improved exercise technique, movement at target joints, use of target muscles after min to mod verbal, visual, tactile cues.    PT Education - 07/28/21 1220     Education provided Yes    Education Details updated carryover assignment, exercise technique body mechanics, gait mechanics    Person(s) Educated Patient    Methods Explanation;Demonstration;Tactile cues;Verbal cues;Handout    Comprehension Verbalized understanding;Returned demonstration;Verbal cues required;Tactile cues required;Need further instruction              PT Short Term Goals - 07/28/21 1513       PT SHORT TERM GOAL #1   Title Pt will demonstrate independence with LSVT BIG HEP to improve strength/mobility for improved safety and ease with ADLs and to maintain/continue gains beyond LSVT  program.    Baseline 9/7: to be initiated next session; 9/26: pt making gains, more indepndent with exercises, but still requires some cuing    Time 2    Period Weeks    Status On-going    Target Date 08/11/21               PT Long Term Goals - 07/28/21 1514       PT LONG TERM GOAL #1   Title Patient will increase FOTO score to equal to or greater than 47  to demonstrate statistically significant improvement in mobility and quality of life.    Baseline 9/7: 34; 9/26: 48    Time 5    Period Weeks    Status Achieved      PT LONG TERM GOAL #2  Title Patient (> 35 years old) will complete five times sit to stand test in < 15 seconds indicating decreased fall risk.    Baseline 9/7: 18.5 sec; 9/26: 16.26 sec hands free    Time 5    Period Weeks    Status Partially Met    Target Date 08/13/21      PT LONG TERM GOAL #3   Title Patient will increase 10 meter walk test to >1.19m/s with WFL B step-length for 100% of test to improve ease with gait and to override hypokentic and bradykinetic movement.    Baseline 9/7: 0.74 m/s, decreased B step length and rigidty of movement; 9/26 deferred    Time 5    Period Weeks    Status On-going    Target Date 08/13/21      PT LONG TERM GOAL #4   Title Patient will reduce timed up and go to <11 seconds to reduce fall risk and demonstrate improved transfer/gait ability and ability to perform turns safely    Baseline 9/7: 17 seconds 9/26: deferred    Time 5    Period Weeks    Status On-going      PT LONG TERM GOAL #5   Title Pt will demo or report ability to open standard jar using only 1-2 attempts in order to exhibit improved functional mobility for ADLs.    Baseline 9/7: Pt reports she is unable to open jars; 9/26: pt ableto open in 2 attempts    Time 5    Period Weeks    Status Achieved                   Plan - 07/28/21 1221     Clinical Impression Statement Goals retested for progress note.  Patient with slight  improvement in 5 times sit to stand time from evaluation, patient able to open jar in 1-2 attempts achieving goal, and patient has met Foto goal.  This indicates slight decrease in fall risk, improved lower extremity power, ability to consistently override hypokinesia and bradykinesia in order to open a jar and improved functional mobility and quality of life. 39 m walk test and timed up and go deferred to next appointment due to time limitations.  Patient still most challenged with ambulating over compliant surface and with left shoulder extension for arm swing with gait.  Patient will benefit from further LSVT  BIG physical therapy treatments to improve general mobility, gait, balance, and strength in order to increase ease and safety with ADLs.    Personal Factors and Comorbidities Age;Sex;Fitness;Time since onset of injury/illness/exacerbation;Comorbidity 1;Comorbidity 3+;Comorbidity 2    Comorbidities MCI, HTN, CA, cataracts, bipolar 1, GAD, insomnia, chronic LBP    Examination-Activity Limitations Bend;Sit;Squat;Stairs;Stand;Locomotion Level;Self Feeding;Bathing;Transfers;Lift;Bed Mobility    Examination-Participation Restrictions Cleaning;Community Activity;Shop;Yard Work;Meal Prep;Personal Finances;Driving    Stability/Clinical Decision Making Evolving/Moderate complexity    Rehab Potential Fair    Clinical Impairments Affecting Rehab Potential MCI, chronic back pain    PT Frequency 4x / week    PT Duration Other (comment)   5 weeks   PT Treatment/Interventions ADLs/Self Care Home Management;Therapeutic exercise;Patient/family education;Neuromuscular re-education;Therapeutic activities;Functional mobility training;Balance training;Gait training;Stair training;Taping;Energy conservation;Passive range of motion;DME Instruction;Cognitive remediation    PT Next Visit Plan Initiate LSVT BIG Maximal daily exercises (see note for adaptions), functional component tasks, hierarchy tasks, carryover tasks,  big gait/walking, TUG, 74mwt    PT Home Exercise Plan LSVT 7 adapted maximal daily exercises (exercies 5 and 7 performed with seated versions), 5  functional component tasks, hierarchy tasks, big walking, and carryover task (updated 9/26)    Consulted and Agree with Plan of Care Patient;Family member/caregiver    Family Member Consulted spouse             Patient will benefit from skilled therapeutic intervention in order to improve the following deficits and impairments:  Abnormal gait, Decreased activity tolerance, Decreased cognition, Decreased endurance, Decreased knowledge of use of DME, Decreased range of motion, Decreased strength, Hypomobility, Dizziness, Pain, Improper body mechanics, Impaired UE functional use, Decreased balance, Decreased coordination, Decreased mobility, Difficulty walking, Increased edema, Impaired flexibility, Increased muscle spasms, Postural dysfunction  Visit Diagnosis: Other abnormalities of gait and mobility  Other lack of coordination  Unsteadiness on feet  Muscle weakness (generalized)  Parkinson disease ([place])     Problem List Patient Active Problem List   Diagnosis Date Noted   Chronic bilateral low back pain 06/18/2021   Elevated blood pressure reading 05/23/2021   Osteopenia of neck of right femur 09/06/2020   Obesity 08/29/2020   Parkinson disease ([place]) 02/22/2020   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   Bipolar disorder, in full remission, most recent episode depressed ([place]) 11/08/2019   Avitaminosis D 08/24/2019   Insomnia due to mental condition 05/31/2019   Bipolar I disorder, most recent episode depressed ([place]) 05/31/2019   Alcohol use disorder, moderate, in sustained remission ([place]) 05/31/2019   Elevated tumor markers 05/27/2019   GAD (generalized anxiety disorder) 04/10/2019   MDD (major depressive disorder), severe ([place]) 01/31/2019   High serum carbohydrate antigen 19-9 (CA19-9) 12/01/2018   Adjustment disorder with anxious mood  11/24/2018   Essential hypertension 11/24/2018   B12 deficiency 10/17/2018   Incisional hernia, without obstruction or gangrene 09/12/2018   Genetic testing 04/07/2018   Malignant neoplasm of upper-outer quadrant of right breast in female, estrogen receptor positive ([place]) 03/17/2018   Malignant neoplasm of upper-inner quadrant of right breast in female, estrogen receptor positive ([place]) 03/17/2018   History of psychosis 02/07/2018   History of insomnia 02/07/2018    Zollie Pee, PT 07/28/2021, 3:23 PM  [place] MAIN [facility] SERVICES [address] [place], [place], [address] Phone: [phone]   Fax:  [phone]  Name: Anntionette Madkins MRN: [id number] Date of Birth: September 16, 1946

## 2021-07-29 ENCOUNTER — Ambulatory Visit: Payer: Medicare PPO | Admitting: Speech Pathology

## 2021-07-29 ENCOUNTER — Ambulatory Visit: Payer: Medicare PPO

## 2021-07-29 ENCOUNTER — Ambulatory Visit: Payer: Medicare PPO | Admitting: Licensed Clinical Social Worker

## 2021-07-29 DIAGNOSIS — R2681 Unsteadiness on feet: Secondary | ICD-10-CM | POA: Diagnosis not present

## 2021-07-29 DIAGNOSIS — R278 Other lack of coordination: Secondary | ICD-10-CM | POA: Diagnosis not present

## 2021-07-29 DIAGNOSIS — M79605 Pain in left leg: Secondary | ICD-10-CM

## 2021-07-29 DIAGNOSIS — M6281 Muscle weakness (generalized): Secondary | ICD-10-CM | POA: Diagnosis not present

## 2021-07-29 DIAGNOSIS — M79604 Pain in right leg: Secondary | ICD-10-CM

## 2021-07-29 DIAGNOSIS — G20 Parkinson's disease: Secondary | ICD-10-CM

## 2021-07-29 DIAGNOSIS — R2689 Other abnormalities of gait and mobility: Secondary | ICD-10-CM | POA: Diagnosis not present

## 2021-07-29 DIAGNOSIS — R471 Dysarthria and anarthria: Secondary | ICD-10-CM | POA: Diagnosis not present

## 2021-07-29 NOTE — Therapy (Signed)
[place] MAIN [facility] SERVICES [address] [place], [place], [address] Phone: [phone]   Fax:  [phone]  Physical Therapy Treatment  Patient Details  Name: Kendra [name] MRN: [id number] Date of Birth: May 16, 1946 Referring Provider (PT): Arbutus Ped., MD   Encounter Date: 07/29/2021   PT End of Session - 07/30/21 0803     Visit Number 11    Number of Visits 17    Date for PT Re-Evaluation 08/13/21    Authorization Type LSVT BIG    Authorization Time Period Evaluation performed on 07/09/2021    PT Start Time 1606    PT Stop Time 1705    PT Time Calculation (min) 59 min    Equipment Utilized During Treatment Gait belt    Activity Tolerance Patient tolerated treatment well;Patient limited by pain    Behavior During Therapy [facility] for tasks assessed/performed             Past Medical History:  Diagnosis Date   Anxiety    Breast cancer ([place]) 03/2018   right breast cancer at 11:00 and 1:00   Bursitis    bilateral hips and knees   Complication of anesthesia    Depression    Family history of breast cancer 03/24/2018   Family history of lung cancer 03/24/2018   History of alcohol dependence ([place]) 2007   no ETOH; resolved since 2007   History of hiatal hernia    History of psychosis 2014   due to sleep disturbance   Hypertension    Parkinson disease ([place])    Personal history of radiation therapy 06/2018-07/2018   right breast ca   PONV (postoperative nausea and vomiting)     Past Surgical History:  Procedure Laterality Date   BREAST BIOPSY Right 03/14/2018   11:00 DCIS and invasive ductal carcinoma   BREAST BIOPSY Right 03/14/2018   1:00 Invasive ductal carcinoma   BREAST LUMPECTOMY Right 04/27/2018   lumpectomy of 11 and 1:00 cancers, clear margins, negative LN   BREAST LUMPECTOMY WITH RADIOACTIVE SEED AND SENTINEL LYMPH NODE BIOPSY Right 04/27/2018   Procedure: RIGHT BREAST LUMPECTOMY WITH RADIOACTIVE SEED X 2  AND RIGHT SENTINEL LYMPH NODE BIOPSY;  Surgeon: [name], MD;  Location: [place];  Service: General;  Laterality: Right;   CATARACT EXTRACTION W/PHACO Left 08/30/2019   Procedure: CATARACT EXTRACTION PHACO AND INTRAOCULAR LENS PLACEMENT ([place]) LEFT panoptix toric  01:20.5  12.7%  10.26;  Surgeon: Leandrew Koyanagi, MD;  Location: [place];  Service: Ophthalmology;  Laterality: Left;   CATARACT EXTRACTION W/PHACO Right 09/20/2019   Procedure: CATARACT EXTRACTION PHACO AND INTRAOCULAR LENS PLACEMENT ([facility]) RIGHT 5.71  00:36.4  15.7%;  Surgeon: Leandrew Koyanagi, MD;  Location: [place];  Service: Ophthalmology;  Laterality: Right;   LAPAROTOMY N/A 02/10/2018   Procedure: EXPLORATORY LAPAROTOMY;  Surgeon: Isabel Caprice, MD;  Location[facility];  Service: Gynecology;  Laterality: N/A;   MASS EXCISION  01/2018   abdominal   OVARIAN CYST REMOVAL     SALPINGOOPHORECTOMY Bilateral 02/10/2018   Procedure: BILATERAL SALPINGO OOPHORECTOMY; PERITONEAL WASHINGS;  Surgeon: Isabel Caprice, MD;  Location: [facility];  Service: Gynecology;  Laterality: Bilateral;   TONSILLECTOMY     TONSILLECTOMY AND ADENOIDECTOMY  1953    There were no vitals filed for this visit.   Subjective Assessment - 07/29/21 1606     Subjective Pt reports she hasn't been able to do her exercises today due to her pain  level. She rates it as 8/10, mainly in the LLE, but generalized msk pain. She reports she took medication before speech appointment. Pt reports LUE got caught on dresser in bedroom and she has multiple scabs/bruises on L arm.    Patient is accompained by: Family member   spouse, [name]   Pertinent History Pt presents to PT for LSVT BIG evaluation. Pt diagnosed with PD in 2019. Pt reports difficulty with gait speed, freezing, transfers, balance, cognition, and use of hands d/t tremors. Pt has previously attended PT for treatment of back pain. Per chart other PMH includes hx  of breast cancer, HTN, MCI, insomnia, cataracts, bipolar 1, alcohol use disorder, GAD, MDD, osteopenia of neck of R femur, chronic B low back pain with radiating pain to buttock and posterior thighs and calves    Limitations Sitting;Standing;Walking;Reading;Lifting;Writing;House hold activities    How long can you sit comfortably? limited if not well positioned. Feet must be propped up.    How long can you stand comfortably? not comfortable, pain is constant    How long can you walk comfortably? 5 min    Diagnostic tests No recent imaging pertinent to PD per chart. Other most recent imaging per Chancis, Juanda Crumble note 06/27/2021: 'MRI lumbar spine without contrast from [facility] dated 01/31/2020 imaging and report reviewed today. L5-S1 central disc bulging with severe bilateral facet arthropathy no central or foraminal stenosis. L4-5 grade 1 anterolisthesis with severe bilateral facet arthropathy and ligamentum flavum hypertrophy. She has severe right and moderate to severe left foraminal stenosis. Moderate central stenosis. L3-4 broad-based disc bulging with severe bilateral facet arthropathy and ligamentum flavum hypertrophy. Severe central stenosis. Moderate severe subarticular stenosis. No foraminal stenosis. L2-3 diffuse disc bulging with severe bilateral facet arthropathy and ligamentum flavum hypertrophy. Severe central stenosis and severe bilateral subarticular recess stenosis. Moderate left and mild to moderate right foraminal stenosis. L1-2 broad-based disc bulging with mild bilateral facet arthropathy. No central or foraminal stenosis."    Patient Stated Goals Pt would like to be able to walk faster, get up from a chair more easily    Currently in Pain? Yes    Pain Score 8     Pain Location Leg    Pain Orientation Left;Right    Pain Onset More than a month ago    Pain Onset More than a month ago            LSVT BIG Treatment:   Patient seen for LSVT Daily Session Maximal Daily  Exercises for facilitation/coordination of movement Sustained movements are designed to rescale the amplitude of movement output for generalization to daily functional activities.   Adapted Maximal Daily Exercises: Floor to Ceiling 10 with 10 second hold; frequent cuing to LUE for abduction; pt rates hard  Side to Side 10 Bilateral with 10 second hold  Forward Step and Reach 12 Bilateral ;  Sideways Step and Reach 12 Bilateral Backwards Step and Reach 12 Bilateral - modified to Seated Maximal Daily Exercise  Forwards Rock and Reach 12 Bilateral Sideways Rock and Reach 12 Bilateral - modified to Seated Maximal Daily exercise    Daily Exercise Comments: Pt performing exercises 5 and 7 seated. Overall improved speed of movement, able to progress exercises 3-7 to 12 reps. Pt required a few rest breaks d/t pain in L hip/Leg. Denies any dizziness sx.    Functional Component Tasks - performance and progress: Sit to Stand x 5 from standard chair 2. Walk and turn 5x  * modified to seated  trunk twist "turn" and reach d/t BLE leg pain limiting ambulation 3. Bend and reach 5x 4. Open Jar 5x  - goal  5. Step up on step 5x Comments: Pt with improved bend and reach technique following verbal cuing for increased speed. Walk and turn had to be modified to a seated trunk twist with LLE ER/abduction with step on same side, d/t ambulation limited by pain on this date.    Hierarchy Tasks - performance and progress: Contact guard assist to min assist provided throughout unless otherwise noted for all upright tasks Going for a walk outside (uneven, varied surfaces); Pt not ready for outside ambulation, focus on ambulating over compliant surface as this triggers pts sx (bradykinesia, hypokinesia, shuffling). Modified to with use of SPC for amb. Over compliant surfaces and in clinic gym d/t increased instability d/t LE pain exacerbation on this date. No LOB, but slight decrease in postural stability, however pt was  able to steady self with SPC. PT did instruct pt initially in sequencing with SPC, where pt quickly was able to utilize correct technique.  2. Handwriting - signing name "my name is ____ and today's date is ____" Pt continues to write BIG with smooth writing, rates task as easy. 3. Ascending/descending stairs; big walk up to stairs with big reach to handrails, upright posture and big ascend/descent down stairs. PT providing close CGA. No decrease in postural stability observed.  Limited to one time through d/t BLE pain, however, no increased pain with task.    Gait - performance and progress: Continued use of close CGA due pt with LE pain exacerbation today. Pt pushes transport chair for UE support for longer distance amb to increase stability. This allowed pt to focus on increasing BLE step length. Pt ambulated from clinic gym down hallway to elevators before sitting down for rest. No increased pain with exercise. Minimal cuing to maintain big steps.    Carryover Assignment: Updated on this date -  big greeting to cousin, with big posture, and big steps toward car.     Homework: Pt to perform all 7 LSVT BIG exercises (adapted and seated for 5 and 7 see above), 5 Functional Component Tasks and big walking.  Reinforced patient in safety with adapted and seated maximal daily exercises.  PT continues to reinforce the following safety instructions from prior sessions. PT instructed patient on use of upper extremity support with exercises.  Patient and spouse verbalized understanding. Updated and instructed pt in carryover task.     Pt educated throughout session about proper posture and technique with exercises. Improved exercise technique, movement at target joints, use of target muscles after min to mod verbal, visual, tactile cues.     PT Education - 07/30/21 0803     Education provided Yes    Education Details exercise technique, body mechanics    Person(s) Educated Patient    Methods  Explanation;Demonstration;Tactile cues;Verbal cues    Comprehension Verbalized understanding;Returned demonstration;Verbal cues required;Tactile cues required;Need further instruction              PT Short Term Goals - 07/28/21 1513       PT SHORT TERM GOAL #1   Title Pt will demonstrate independence with LSVT BIG HEP to improve strength/mobility for improved safety and ease with ADLs and to maintain/continue gains beyond LSVT program.    Baseline 9/7: to be initiated next session; 9/26: pt making gains, more indepndent with exercises, but still requires some cuing    Time 2  Period Weeks    Status On-going    Target Date 08/11/21               PT Long Term Goals - 07/28/21 1514       PT LONG TERM GOAL #1   Title Patient will increase FOTO score to equal to or greater than 47  to demonstrate statistically significant improvement in mobility and quality of life.    Baseline 9/7: 34; 9/26: 48    Time 5    Period Weeks    Status Achieved      PT LONG TERM GOAL #2   Title Patient (> 22 years old) will complete five times sit to stand test in < 15 seconds indicating decreased fall risk.    Baseline 9/7: 18.5 sec; 9/26: 16.26 sec hands free    Time 5    Period Weeks    Status Partially Met    Target Date 08/13/21      PT LONG TERM GOAL #3   Title Patient will increase 10 meter walk test to >1.2ms with WFL B step-length for 100% of test to improve ease with gait and to override hypokentic and bradykinetic movement.    Baseline 9/7: 0.74 m/s, decreased B step length and rigidty of movement; 9/26 deferred    Time 5    Period Weeks    Status On-going    Target Date 08/13/21      PT LONG TERM GOAL #4   Title Patient will reduce timed up and go to <11 seconds to reduce fall risk and demonstrate improved transfer/gait ability and ability to perform turns safely    Baseline 9/7: 17 seconds 9/26: deferred    Time 5    Period Weeks    Status On-going      PT LONG TERM  GOAL #5   Title Pt will demo or report ability to open standard jar using only 1-2 attempts in order to exhibit improved functional mobility for ADLs.    Baseline 9/7: Pt reports she is unable to open jars; 9/26: pt ableto open in 2 attempts    Time 5    Period Weeks    Status Achieved                   Plan - 07/30/21 0813     Clinical Impression Statement Certain interventions, including gait training, somewhat limited today as pt with exacerbation in BLE pain on this date. However, pt was able to progress volume of reps performed with exercises 3-7 of maximal daily exercises. Pt performed all ambulatory interventions with use of SPC over compliant surface and pushing transport chair ambulating for distance. Pt required minimal cuing for sustaining increased BLE step-length. She continues to exhibit increased independence with exercise technique. Pt required frequent rest breaks throughout. The pt will benefit from further LSVT BIG physical therapy treatments to improve mobility, gait, balance, and strength to decrease fall risk and increase QOL.    Personal Factors and Comorbidities Age;Sex;Fitness;Time since onset of injury/illness/exacerbation;Comorbidity 1;Comorbidity 3+;Comorbidity 2    Comorbidities MCI, HTN, CA, cataracts, bipolar 1, GAD, insomnia, chronic LBP    Examination-Activity Limitations Bend;Sit;Squat;Stairs;Stand;Locomotion Level;Self Feeding;Bathing;Transfers;Lift;Bed Mobility    Examination-Participation Restrictions Cleaning;Community Activity;Shop;Yard Work;Meal Prep;Personal Finances;Driving    Stability/Clinical Decision Making Evolving/Moderate complexity    Rehab Potential Fair    Clinical Impairments Affecting Rehab Potential MCI, chronic back pain    PT Frequency 4x / week    PT Duration Other (comment)  5 weeks   PT Treatment/Interventions ADLs/Self Care Home Management;Therapeutic exercise;Patient/family education;Neuromuscular re-education;Therapeutic  activities;Functional mobility training;Balance training;Gait training;Stair training;Taping;Energy conservation;Passive range of motion;DME Instruction;Cognitive remediation    PT Next Visit Plan Initiate LSVT BIG Maximal daily exercises (see note for adaptions), functional component tasks, hierarchy tasks, carryover tasks, big gait/walking, TUG, 85mt    PT Home Exercise Plan LSVT 7 adapted maximal daily exercises (exercies 5 and 7 performed with seated versions), 5 functional component tasks, hierarchy tasks, big walking, and carryover task (updated 9/28)    Consulted and Agree with Plan of Care Patient             Patient will benefit from skilled therapeutic intervention in order to improve the following deficits and impairments:  Abnormal gait, Decreased activity tolerance, Decreased cognition, Decreased endurance, Decreased knowledge of use of DME, Decreased range of motion, Decreased strength, Hypomobility, Dizziness, Pain, Improper body mechanics, Impaired UE functional use, Decreased balance, Decreased coordination, Decreased mobility, Difficulty walking, Increased edema, Impaired flexibility, Increased muscle spasms, Postural dysfunction  Visit Diagnosis: Other abnormalities of gait and mobility  Other lack of coordination  Unsteadiness on feet  Muscle weakness (generalized)  Parkinson disease (HCC)  Pain in left leg  Pain in right leg     Problem List Patient Active Problem List   Diagnosis Date Noted   Chronic bilateral low back pain 06/18/2021   Elevated blood pressure reading 05/23/2021   Osteopenia of neck of right femur 09/06/2020   Obesity 08/29/2020   Parkinson disease ([place] 02/22/2020   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   Bipolar disorder, in full remission, most recent episode depressed ([place] 11/08/2019   Avitaminosis D 08/24/2019   Insomnia due to mental condition 05/31/2019   Bipolar I disorder, most recent episode depressed ([place] 05/31/2019   Alcohol  use disorder, moderate, in sustained remission ([place] 05/31/2019   Elevated tumor markers 05/27/2019   GAD (generalized anxiety disorder) 04/10/2019   MDD (major depressive disorder), severe ([place] 01/31/2019   High serum carbohydrate antigen 19-9 (CA19-9) 12/01/2018   Adjustment disorder with anxious mood 11/24/2018   Essential hypertension 11/24/2018   B12 deficiency 10/17/2018   Incisional hernia, without obstruction or gangrene 09/12/2018   Genetic testing 04/07/2018   Malignant neoplasm of upper-outer quadrant of right breast in female, estrogen receptor positive ([place] 03/17/2018   Malignant neoplasm of upper-inner quadrant of right breast in female, estrogen receptor positive ([place] 03/17/2018   History of psychosis 02/07/2018   History of insomnia 02/07/2018    HZollie Pee PT 07/30/2021, 8:17 AM  [place] [facility] [address] [address]Phone: [phone]  Fax:  [phone] Name: JTyrica AfzalMRN: [id number] of Birth: 103-31-1947

## 2021-07-29 NOTE — Progress Notes (Signed)
LCSW counselor tried to connect with patient for scheduled appointment via [place] audio without success. Counselor also sent text and email reminders. LCSW counselor could not leave message due to voice mailbox being full.

## 2021-07-29 NOTE — Therapy (Signed)
[facility] [facility] MAIN [facility] SERVICES [address] [place], [place], [address] Phone: [phone]   Fax:  [phone]  Speech Language Pathology Treatment and Progress Note  Patient Details  Name: Kendra [name] MRN: [id number] Date of Birth: 03-Apr-1946 Referring Provider (SLP): Maple Hudson., MD    Encounter Date: 07/29/2021  Speech Therapy Progress Note  Dates of Reporting Period: 07/09/21 to 07/29/21  Objective: Patient has been seen for 10 speech therapy sessions this reporting period targeting dysarthria. Patient is making progress toward LTGs and met 3/3 STGs this reporting period. See skilled intervention, clinical impressions, and goals below for details.    End of Session - 07/29/21 1555     Visit Number 10    Number of Visits 17    Date for SLP Re-Evaluation 10/07/21    Authorization - Visit Number 1    Progress Note Due on Visit 10    SLP Start Time 1500    SLP Stop Time  1600    SLP Time Calculation (min) 60 min    Activity Tolerance Patient tolerated treatment well             Past Medical History:  Diagnosis Date   Anxiety    Breast cancer (HCC) 03/2018   right breast cancer at 11:00 and 1:00   Bursitis    bilateral hips and knees   Complication of anesthesia    Depression    Family history of breast cancer 03/24/2018   Family history of lung cancer 03/24/2018   History of alcohol dependence (HCC) 2007   no ETOH; resolved since 2007   History of hiatal hernia    History of psychosis 2014   due to sleep disturbance   Hypertension    Parkinson disease (HCC)    Personal history of radiation therapy 06/2018-07/2018   right breast ca   PONV (postoperative nausea and vomiting)     Past Surgical History:  Procedure Laterality Date   BREAST BIOPSY Right 03/14/2018   11:00 DCIS and invasive ductal carcinoma   BREAST BIOPSY Right 03/14/2018   1:00 Invasive ductal carcinoma   BREAST LUMPECTOMY Right 04/27/2018    lumpectomy of 11 and 1:00 cancers, clear margins, negative LN   BREAST LUMPECTOMY WITH RADIOACTIVE SEED AND SENTINEL LYMPH NODE BIOPSY Right 04/27/2018   Procedure: RIGHT BREAST LUMPECTOMY WITH RADIOACTIVE SEED X 2 AND RIGHT SENTINEL LYMPH NODE BIOPSY;  Surgeon: Glenna Fellows, MD;  Location: [facility];  Service: General;  Laterality: Right;   CATARACT EXTRACTION W/PHACO Left 08/30/2019   Procedure: CATARACT EXTRACTION PHACO AND INTRAOCULAR LENS PLACEMENT (IOC) LEFT panoptix toric  01:20.5  12.7%  10.26;  Surgeon: Lockie Mola, MD;  Location[facility];  Service: Ophthalmology;  Laterality: Left;   CATARACT EXTRACTION W/PHACO Right 09/20/2019   Procedure: CATARACT EXTRACTION PHACO AND INTRAOCULAR LENS PLACEMENT (IOC) RIGHT 5.71  00:36.4  15.7%;  Surgeon: Lockie Mola, MD;  Location: [facility];  Service: Ophthalmology;  Laterality: Right;   LAPAROTOMY N/A 02/10/2018   Procedure: EXPLORATORY LAPAROTOMY;  Surgeon: Shonna Chock, MD;  Location: [facility];  Service: Gynecology;  Laterality: N/A;   MASS EXCISION  01/2018   abdominal   OVARIAN CYST REMOVAL     SALPINGOOPHORECTOMY Bilateral 02/10/2018   Procedure: BILATERAL SALPINGO OOPHORECTOMY; PERITONEAL WASHINGS;  Surgeon: Shonna Chock, MD;  Location: [facility];  Service: Gynecology;  Laterality: Bilateral;   TONSILLECTOMY     TONSILLECTOMY AND ADENOIDECTOMY  1953  There were no vitals filed for this visit.   Subjective Assessment - 07/29/21 1547     Subjective Greeted SLP in loud voice; alone today    Currently in Pain? --   none while sitting                  ADULT SLP TREATMENT - 07/29/21 1552       General Information   Behavior/Cognition Alert;Cooperative    HPI Patient is a 75 y.o. female with history of Parkinson's disease, anxiety, depression, psychosis, HTN. bipolar disorder, alcoholism, memory issues since 2005, hx head trauma. Referred for LSVT BIG and LOUD.       Cognitive-Linquistic Treatment   Treatment focused on Dysarthria;Patient/family/caregiver education    Skilled Treatment LSVT Daily tasks with rare min cues. Loud /a/ averaged 85 dB, duration 14.5 seconds, pitch avg 244 Hz.  Max F0 for pitch glides was 289 Hz (84dB avg), min F0 for low glides was 165 Hz (83 dB avg). Functional phrases averaged 83 dB with modified independence. Hierarchical loudness drills at paragraph level averaged 80 dB, with occasional min cues for loudness. Carryover to conversation averaged 77 dB for brief periods up to 5 minutes.        Assessment / Recommendations / Plan   Plan Continue with current plan of care      Progression Toward Goals   Progression toward goals Progressing toward goals              SLP Education - 07/29/21 1552     Education Details same effort with /a/ in reading    Person(s) Educated Patient    Methods Explanation    Comprehension Verbalized understanding              SLP Short Term Goals - 07/29/21 1555       SLP SHORT TERM GOAL #1   Title The patient will complete Daily Tasks (Maximum duration "ah", High/Lows, and Functional Phrases) at average loudness >/= 85 dB and with loud, good quality voice with min cues.    Time 10    Period --   sessions   Status Achieved      SLP SHORT TERM GOAL #2   Title The patient will complete Hierarchal Speech Loudness reading drills (words/phrases, sentences) at average >/= 75 dB and with loud, good quality voice with min cues.    Time 10    Period --   sessions   Status Achieved      SLP SHORT TERM GOAL #3   Title The patient will participate in 5-8 minutes conversation, maintaining average loudness of 75 dB and loud, good quality voice with min cues.    Time 10    Period --   sessions   Status Achieved              SLP Long Term Goals - 07/29/21 1556       SLP LONG TERM GOAL #1   Title The patient will complete Daily Tasks (Maximum duration "ah", High/Lows, and  Functional Phrases) at average loudness >/= 85 dB and with loud, good quality voice    Time 4    Period Weeks   or 17 sessions, for all LTGs   Status On-going      SLP LONG TERM GOAL #2   Title The patient will complete Hierarchal Speech Loudness reading drills (words/phrases, sentences, and paragraph) at average >/= 75 dB and with loud, good quality voice.    Time 4  Period Weeks    Status On-going      SLP LONG TERM GOAL #3   Title The patient will participate in 15-20 minutes conversation, maintaining average loudness of >/= 75 dB and loud, good quality voice.    Time 4    Period Weeks    Status On-going      SLP LONG TERM GOAL #4   Title Patient will report improved communication effectiveness as measured by Communicative Effectiveness Survey.    Baseline 19/32 at eval    Time 4    Period Weeks    Status On-going              Plan - 07/29/21 1555     Clinical Impression Statement Patient presents with mild hypokinetic dysarthria. Continues to make steady progress toward goals and is beginning to demonstrate carryover to conversation for short periods. Vocal quality and endurance improving in structured tasks. Reading at paragraph level tasks today, with occasional min-mod verbal cues necessary to maintain vocal intensity. I recommend skilled ST for LSVT LOUD 4x a week for 4 weeks in order to improve vocal quality, intelligibility, and communicative effectiveness.    Speech Therapy Frequency 4x / week    Duration 4 weeks    Treatment/Interventions Language facilitation;Environmental controls;Cueing hierarchy;SLP instruction and feedback;Compensatory techniques;Functional tasks;Patient/family education;Multimodal communcation approach   LSVT LOUD   Potential to Achieve Goals Good    Potential Considerations Ability to learn/carryover information    SLP Home Exercise Plan to be provided next session    Consulted and Agree with Plan of Care Patient;Family member/caregiver     Family Member Consulted spouse             Patient will benefit from skilled therapeutic intervention in order to improve the following deficits and impairments:   Dysarthria and anarthria  Parkinson disease ([place])    Problem List Patient Active Problem List   Diagnosis Date Noted   Chronic bilateral low back pain 06/18/2021   Elevated blood pressure reading 05/23/2021   Osteopenia of neck of right femur 09/06/2020   Obesity 08/29/2020   Parkinson disease ([place]) 02/22/2020   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   Bipolar disorder, in full remission, most recent episode depressed ([place]) 11/08/2019   Avitaminosis D 08/24/2019   Insomnia due to mental condition 05/31/2019   Bipolar I disorder, most recent episode depressed ([place]) 05/31/2019   Alcohol use disorder, moderate, in sustained remission ([place]) 05/31/2019   Elevated tumor markers 05/27/2019   GAD (generalized anxiety disorder) 04/10/2019   MDD (major depressive disorder), severe ([place]) 01/31/2019   High serum carbohydrate antigen 19-9 (CA19-9) 12/01/2018   Adjustment disorder with anxious mood 11/24/2018   Essential hypertension 11/24/2018   B12 deficiency 10/17/2018   Incisional hernia, without obstruction or gangrene 09/12/2018   Genetic testing 04/07/2018   Malignant neoplasm of upper-outer quadrant of right breast in female, estrogen receptor positive ([place]) 03/17/2018   Malignant neoplasm of upper-inner quadrant of right breast in female, estrogen receptor positive ([place]) 03/17/2018   History of psychosis 02/07/2018   History of insomnia 02/07/2018   Deneise Lever, [place], CCC-SLP Speech-Language Pathologist   Aliene Altes 07/29/2021, 4:09 PM  [place] MAIN [facility] SERVICES [place], [place], [address] Phone: [phone]   Fax:  [phone]   Name: Kendra [name] MRN: [id number] Date of Birth: 1946-10-17

## 2021-07-30 ENCOUNTER — Ambulatory Visit: Payer: Medicare PPO

## 2021-07-30 ENCOUNTER — Other Ambulatory Visit: Payer: Self-pay

## 2021-07-30 ENCOUNTER — Ambulatory Visit: Payer: Medicare PPO | Admitting: Speech Pathology

## 2021-07-30 DIAGNOSIS — M79605 Pain in left leg: Secondary | ICD-10-CM | POA: Diagnosis not present

## 2021-07-30 DIAGNOSIS — R2689 Other abnormalities of gait and mobility: Secondary | ICD-10-CM | POA: Diagnosis not present

## 2021-07-30 DIAGNOSIS — M6281 Muscle weakness (generalized): Secondary | ICD-10-CM | POA: Diagnosis not present

## 2021-07-30 DIAGNOSIS — G20 Parkinson's disease: Secondary | ICD-10-CM

## 2021-07-30 DIAGNOSIS — R471 Dysarthria and anarthria: Secondary | ICD-10-CM | POA: Diagnosis not present

## 2021-07-30 DIAGNOSIS — M79604 Pain in right leg: Secondary | ICD-10-CM | POA: Diagnosis not present

## 2021-07-30 DIAGNOSIS — R278 Other lack of coordination: Secondary | ICD-10-CM

## 2021-07-30 DIAGNOSIS — R2681 Unsteadiness on feet: Secondary | ICD-10-CM | POA: Diagnosis not present

## 2021-07-30 NOTE — Therapy (Signed)
[place] MAIN [facility] SERVICES [address] [place], [place], [address] Phone: [phone]   Fax:  [phone]  Speech Language Pathology Treatment  Patient Details  Name: Kendra [name] MRN: [id number] Date of Birth: 12-02-1945 Referring Provider (SLP): Jerrol Banana., MD   Encounter Date: 07/30/2021   End of Session - 07/30/21 1517     Visit Number 11    Number of Visits 17    Date for SLP Re-Evaluation 10/07/21    Authorization - Visit Number 2    Progress Note Due on Visit 10    SLP Start Time 1000    SLP Stop Time  1100    SLP Time Calculation (min) 60 min    Activity Tolerance Patient tolerated treatment well             Past Medical History:  Diagnosis Date   Anxiety    Breast cancer ([place]) 03/2018   right breast cancer at 11:00 and 1:00   Bursitis    bilateral hips and knees   Complication of anesthesia    Depression    Family history of breast cancer 03/24/2018   Family history of lung cancer 03/24/2018   History of alcohol dependence ([place]) 2007   no ETOH; resolved since 2007   History of hiatal hernia    History of psychosis 2014   due to sleep disturbance   Hypertension    Parkinson disease ([place])    Personal history of radiation therapy 06/2018-07/2018   right breast ca   PONV (postoperative nausea and vomiting)     Past Surgical History:  Procedure Laterality Date   BREAST BIOPSY Right 03/14/2018   11:00 DCIS and invasive ductal carcinoma   BREAST BIOPSY Right 03/14/2018   1:00 Invasive ductal carcinoma   BREAST LUMPECTOMY Right 04/27/2018   lumpectomy of 11 and 1:00 cancers, clear margins, negative LN   BREAST LUMPECTOMY WITH RADIOACTIVE SEED AND SENTINEL LYMPH NODE BIOPSY Right 04/27/2018   Procedure: RIGHT BREAST LUMPECTOMY WITH RADIOACTIVE SEED X 2 AND RIGHT SENTINEL LYMPH NODE BIOPSY;  Surgeon: [name], MD;  Location: [place];  Service: General;  Laterality: Right;    CATARACT EXTRACTION W/PHACO Left 08/30/2019   Procedure: CATARACT EXTRACTION PHACO AND INTRAOCULAR LENS PLACEMENT ([place]) LEFT panoptix toric  01:20.5  12.7%  10.26;  Surgeon: Leandrew Koyanagi, MD;  Location: [place];  Service: Ophthalmology;  Laterality: Left;   CATARACT EXTRACTION W/PHACO Right 09/20/2019   Procedure: CATARACT EXTRACTION PHACO AND INTRAOCULAR LENS PLACEMENT (IOC) RIGHT 5.71  00:36.4  15.7%;  Surgeon: Leandrew Koyanagi, MD;  Location: [place];  Service: Ophthalmology;  Laterality: Right;   LAPAROTOMY N/A 02/10/2018   Procedure: EXPLORATORY LAPAROTOMY;  Surgeon: Isabel Caprice, MD;  Location: [facility];  Service: Gynecology;  Laterality: N/A;   MASS EXCISION  01/2018   abdominal   OVARIAN CYST REMOVAL     SALPINGOOPHORECTOMY Bilateral 02/10/2018   Procedure: BILATERAL SALPINGO OOPHORECTOMY; PERITONEAL WASHINGS;  Surgeon: Isabel Caprice, MD;  Location: [facility];  Service: Gynecology;  Laterality: Bilateral;   TONSILLECTOMY     TONSILLECTOMY AND ADENOIDECTOMY  1953    There were no vitals filed for this visit.   Subjective Assessment - 07/30/21 1516     Subjective Reports therapy is "hard" but reports she feels she is getting stronger    Currently in Pain? No/denies  ADULT SLP TREATMENT - 07/30/21 1517       General Information   Behavior/Cognition Alert;Cooperative    HPI Patient is a 75 y.o. female with history of Parkinson's disease, anxiety, depression, psychosis, HTN. bipolar disorder, alcoholism, memory issues since 2005, hx head trauma. Referred for LSVT BIG and LOUD.      Treatment Provided   Treatment provided Cognitive-Linquistic      Cognitive-Linquistic Treatment   Treatment focused on Dysarthria;Patient/family/caregiver education    Skilled Treatment LSVT Daily tasks with occasional min cues. Loud /a/ averaged 83 dB, duration 15.5 seconds, pitch avg 235 Hz.  Max F0 for pitch glides was 287 Hz (84dB  avg), min F0 for low glides was 180 Hz (84 dB avg). Functional phrases averaged 84 dB with modified independence. Hierarchical loudness drills at paragraph level averaged 79 dB, with occasional moderate cues for loudness. Off the cuff responses averaged 75 dB.         Assessment / Recommendations / Plan   Plan Continue with current plan of care      Progression Toward Goals   Progression toward goals Progressing toward goals              SLP Education - 07/30/21 1517     Education Details Focus on loud speech    Person(s) Educated Patient    Methods Explanation    Comprehension Verbalized understanding              SLP Short Term Goals - 07/29/21 1555       SLP SHORT TERM GOAL #1   Title The patient will complete Daily Tasks (Maximum duration "ah", High/Lows, and Functional Phrases) at average loudness >/= 85 dB and with loud, good quality voice with min cues.    Time 10    Period --   sessions   Status Achieved      SLP SHORT TERM GOAL #2   Title The patient will complete Hierarchal Speech Loudness reading drills (words/phrases, sentences) at average >/= 75 dB and with loud, good quality voice with min cues.    Time 10    Period --   sessions   Status Achieved      SLP SHORT TERM GOAL #3   Title The patient will participate in 5-8 minutes conversation, maintaining average loudness of 75 dB and loud, good quality voice with min cues.    Time 10    Period --   sessions   Status Achieved              SLP Long Term Goals - 07/29/21 1556       SLP LONG TERM GOAL #1   Title The patient will complete Daily Tasks (Maximum duration "ah", High/Lows, and Functional Phrases) at average loudness >/= 85 dB and with loud, good quality voice    Time 4    Period Weeks   or 17 sessions, for all LTGs   Status On-going      SLP LONG TERM GOAL #2   Title The patient will complete Hierarchal Speech Loudness reading drills (words/phrases, sentences, and paragraph) at  average >/= 75 dB and with loud, good quality voice.    Time 4    Period Weeks    Status On-going      SLP LONG TERM GOAL #3   Title The patient will participate in 15-20 minutes conversation, maintaining average loudness of >/= 75 dB and loud, good quality voice.    Time 4  Period Weeks    Status On-going      SLP LONG TERM GOAL #4   Title Patient will report improved communication effectiveness as measured by Communicative Effectiveness Survey.    Baseline 19/32 at eval    Time 4    Period Weeks    Status On-going              Plan - 07/30/21 1518     Clinical Impression Statement Patient presents with mild hypokinetic dysarthria. Continues to make steady progress toward goals and is beginning to demonstrate carryover to conversation for short periods. Vocal quality and endurance improving in structured tasks. Reading at paragraph level tasks today, with occasional min-mod verbal cues necessary to maintain vocal intensity. I recommend skilled ST for LSVT LOUD 4x a week for 4 weeks in order to improve vocal quality, intelligibility, and communicative effectiveness.    Speech Therapy Frequency 4x / week    Duration 4 weeks    Treatment/Interventions Language facilitation;Environmental controls;Cueing hierarchy;SLP instruction and feedback;Compensatory techniques;Functional tasks;Patient/family education;Multimodal communcation approach   LSVT LOUD   Potential to Achieve Goals Good    Potential Considerations Ability to learn/carryover information    SLP Home Exercise Plan to be provided next session    Consulted and Agree with Plan of Care Patient;Family member/caregiver    Family Member Consulted spouse             Patient will benefit from skilled therapeutic intervention in order to improve the following deficits and impairments:   Dysarthria and anarthria  Parkinson disease ([place])    Problem List Patient Active Problem List   Diagnosis Date Noted   Chronic  bilateral low back pain 06/18/2021   Elevated blood pressure reading 05/23/2021   Osteopenia of neck of right femur 09/06/2020   Obesity 08/29/2020   Parkinson disease ([place]) 02/22/2020   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   Bipolar disorder, in full remission, most recent episode depressed ([place]) 11/08/2019   Avitaminosis D 08/24/2019   Insomnia due to mental condition 05/31/2019   Bipolar I disorder, most recent episode depressed ([place]) 05/31/2019   Alcohol use disorder, moderate, in sustained remission ([place]) 05/31/2019   Elevated tumor markers 05/27/2019   GAD (generalized anxiety disorder) 04/10/2019   MDD (major depressive disorder), severe ([place]) 01/31/2019   High serum carbohydrate antigen 19-9 (CA19-9) 12/01/2018   Adjustment disorder with anxious mood 11/24/2018   Essential hypertension 11/24/2018   B12 deficiency 10/17/2018   Incisional hernia, without obstruction or gangrene 09/12/2018   Genetic testing 04/07/2018   Malignant neoplasm of upper-outer quadrant of right breast in female, estrogen receptor positive ([place]) 03/17/2018   Malignant neoplasm of upper-inner quadrant of right breast in female, estrogen receptor positive ([place]) 03/17/2018   History of psychosis 02/07/2018   History of insomnia 02/07/2018   Deneise Lever, [place], CCC-SLP Speech-Language Pathologist  Aliene Altes 07/30/2021, 3:18 PM  [place] MAIN [facility] SERVICES [address] [place], [place], [address] Phone: [phone]   Fax:  [phone]   Name: Kendra [name] MRN: [id number] Date of Birth: [birth year]

## 2021-07-30 NOTE — Therapy (Signed)
[facility] MAIN [facility] SERVICES [address] [place], [place], [address] Phone: [phone]   Fax:  [phone]  Physical Therapy Treatment  Patient Details  Name: Kendra [name] MRN: [id number] Date of Birth: 10-24-1946 Referring Provider (PT): Delice Lesch., MD   Encounter Date: 07/30/2021   PT End of Session - 07/30/21 1311     Visit Number 12    Number of Visits 17    Date for PT Re-Evaluation 08/13/21    Authorization Type LSVT BIG    Authorization Time Period Evaluation performed on 07/09/2021    PT Start Time 1118    PT Stop Time 1216    PT Time Calculation (min) 58 min    Equipment Utilized During Treatment Gait belt    Activity Tolerance Patient tolerated treatment well;Patient limited by pain    Behavior During Therapy [facility] for tasks assessed/performed             Past Medical History:  Diagnosis Date   Anxiety    Breast cancer (HCC) 03/2018   right breast cancer at 11:00 and 1:00   Bursitis    bilateral hips and knees   Complication of anesthesia    Depression    Family history of breast cancer 03/24/2018   Family history of lung cancer 03/24/2018   History of alcohol dependence (HCC) 2007   no ETOH; resolved since 2007   History of hiatal hernia    History of psychosis 2014   due to sleep disturbance   Hypertension    Parkinson disease (HCC)    Personal history of radiation therapy 06/2018-07/2018   right breast ca   PONV (postoperative nausea and vomiting)     Past Surgical History:  Procedure Laterality Date   BREAST BIOPSY Right 03/14/2018   11:00 DCIS and invasive ductal carcinoma   BREAST BIOPSY Right 03/14/2018   1:00 Invasive ductal carcinoma   BREAST LUMPECTOMY Right 04/27/2018   lumpectomy of 11 and 1:00 cancers, clear margins, negative LN   BREAST LUMPECTOMY WITH RADIOACTIVE SEED AND SENTINEL LYMPH NODE BIOPSY Right 04/27/2018   Procedure: RIGHT BREAST LUMPECTOMY WITH RADIOACTIVE SEED X 2  AND RIGHT SENTINEL LYMPH NODE BIOPSY;  Surgeon: Glenna Fellows, MD;  Location: [facility];  Service: General;  Laterality: Right;   CATARACT EXTRACTION W/PHACO Left 08/30/2019   Procedure: CATARACT EXTRACTION PHACO AND INTRAOCULAR LENS PLACEMENT (IOC) LEFT panoptix toric  01:20.5  12.7%  10.26;  Surgeon: Lockie Mola, MD;  Location: [facility];  Service: Ophthalmology;  Laterality: Left;   CATARACT EXTRACTION W/PHACO Right 09/20/2019   Procedure: CATARACT EXTRACTION PHACO AND INTRAOCULAR LENS PLACEMENT (IOC) RIGHT 5.71  00:36.4  15.7%;  Surgeon: Lockie Mola, MD;  Location: [facility];  Service: Ophthalmology;  Laterality: Right;   LAPAROTOMY N/A 02/10/2018   Procedure: EXPLORATORY LAPAROTOMY;  Surgeon: Shonna Chock, MD;  Location: [facility];  Service: Gynecology;  Laterality: N/A;   MASS EXCISION  01/2018   abdominal   OVARIAN CYST REMOVAL     SALPINGOOPHORECTOMY Bilateral 02/10/2018   Procedure: BILATERAL SALPINGO OOPHORECTOMY; PERITONEAL WASHINGS;  Surgeon: Shonna Chock, MD;  Location: [facility];  Service: Gynecology;  Laterality: Bilateral;   TONSILLECTOMY     TONSILLECTOMY AND ADENOIDECTOMY  1953    There were no vitals filed for this visit.   Subjective Assessment - 07/30/21 1118     Subjective Pt reports no pain currently at rest. She says she has improved from yesterday.  Patient is accompained by: Family member   spouse, [name]   Pertinent History Pt presents to PT for LSVT BIG evaluation. Pt diagnosed with PD in 2019. Pt reports difficulty with gait speed, freezing, transfers, balance, cognition, and use of hands d/t tremors. Pt has previously attended PT for treatment of back pain. Per chart other PMH includes hx of breast cancer, HTN, MCI, insomnia, cataracts, bipolar 1, alcohol use disorder, GAD, MDD, osteopenia of neck of R femur, chronic B low back pain with radiating pain to buttock and posterior thighs and calves     Limitations Sitting;Standing;Walking;Reading;Lifting;Writing;House hold activities    How long can you sit comfortably? limited if not well positioned. Feet must be propped up.    How long can you stand comfortably? not comfortable, pain is constant    How long can you walk comfortably? 5 min    Diagnostic tests No recent imaging pertinent to PD per chart. Other most recent imaging per Chancis, Juanda Crumble note 06/27/2021: 'MRI lumbar spine without contrast from [facility] dated 01/31/2020 imaging and report reviewed today. L5-S1 central disc bulging with severe bilateral facet arthropathy no central or foraminal stenosis. L4-5 grade 1 anterolisthesis with severe bilateral facet arthropathy and ligamentum flavum hypertrophy. She has severe right and moderate to severe left foraminal stenosis. Moderate central stenosis. L3-4 broad-based disc bulging with severe bilateral facet arthropathy and ligamentum flavum hypertrophy. Severe central stenosis. Moderate severe subarticular stenosis. No foraminal stenosis. L2-3 diffuse disc bulging with severe bilateral facet arthropathy and ligamentum flavum hypertrophy. Severe central stenosis and severe bilateral subarticular recess stenosis. Moderate left and mild to moderate right foraminal stenosis. L1-2 broad-based disc bulging with mild bilateral facet arthropathy. No central or foraminal stenosis."    Patient Stated Goals Pt would like to be able to walk faster, get up from a chair more easily    Currently in Pain? No/denies    Pain Onset More than a month ago    Pain Onset More than a month ago               LSVT BIG Treatment:   Patient seen for LSVT Daily Session Maximal Daily Exercises for facilitation/coordination of movement Sustained movements are designed to rescale the amplitude of movement output for generalization to daily functional activities.   Adapted Maximal Daily Exercises: Floor to Ceiling 10 with 10 second hold; frequent cuing to LUE  for abduction; pt rates hard  Side to Side 10 Bilateral with 10 second hold  Forward Step and Reach 12 Bilateral ;  Sideways Step and Reach 12 Bilateral Backwards Step and Reach 12 Bilateral - modified to Seated Maximal Daily Exercise  Forwards Rock and Reach 12 Bilateral Sideways Rock and Reach 12 Bilateral - modified to Seated Maximal Daily exercise    Daily Exercise Comments: Pt performing exercises 5 and 7 seated. Pt continues to perform increased reps of all exercises. She only required 1-2 rest breaks, indicating improved activity tolerance. No increases in pain with exercise. The pt requires some cuing for finger abduction and with sequencing of seated backwards step and reach.   Functional Component Tasks - performance and progress: Sit to Stand x 5 from standard chair 2. Walk and turn 5x  3. Bend and reach 5x 4. Open Jar 5x  - goal  5. Step up on step 5x Comments: Pt consistent in ability to perform majority of functional component tasks with minimal cuing and correct technique. Pt requires most cuing for STS technique as pt confuses this exercise  with a maximal daily exercise today.   Hierarchy Tasks - performance and progress: Contact guard assist to min assist provided throughout unless otherwise noted for all upright tasks Going for a walk outside (uneven, varied surfaces); Pt not ready for outside ambulation, focus on ambulating over compliant surface as this triggers pts sx (bradykinesia, hypokinesia, shuffling).  Pt able to perform without use of AD, but with close CGA. Improved postural stability compared to prior sessions. Pt was able to turn and take large amplitude steps on red mat for longer period of time before needing to rest.  2. Handwriting - signing name "my name is ____ and today's date is ____" Pt independent with activity. Does not require cuing today.  3. Ascending/descending stairs; big walk up to stairs with big reach to handrails, upright posture and big  ascend/descent down stairs. PT providing close CGA. No pain with task. Cues provided for upright posture.    Gait - performance and progress: 2x148 laps around clinic/in hallway without AD but with close CGA. TC provided to LUE to improve arm-swing, demo provided to model sequencing of arm swing with big steps. Pt with improved activity tolerance. Cues also provided for finger abduction.    Carryover Assignment: Updated on this date - when pt goes out for lunch pt reach big for menu, hold it up big. Once done with lunch pt to stand big from chair with upright posture and walk big.     Homework: Pt to perform all 7 LSVT BIG exercises (adapted and seated for 5 and 7 see above), 5 Functional Component Tasks and big walking.  Reinforced patient in safety with adapted and seated maximal daily exercises.  PT continues to reinforce the following safety instructions from prior sessions. PT instructed patient on use of upper extremity support with exercises.  Patient and spouse verbalized understanding. Updated and instructed pt in carryover task.     Pt educated throughout session about proper posture and technique with exercises. Improved exercise technique, movement at target joints, use of target muscles after min to mod verbal, visual, tactile cues.     PT Short Term Goals - 07/28/21 1513       PT SHORT TERM GOAL #1   Title Pt will demonstrate independence with LSVT BIG HEP to improve strength/mobility for improved safety and ease with ADLs and to maintain/continue gains beyond LSVT program.    Baseline 9/7: to be initiated next session; 9/26: pt making gains, more indepndent with exercises, but still requires some cuing    Time 2    Period Weeks    Status On-going    Target Date 08/11/21               PT Long Term Goals - 07/28/21 1514       PT LONG TERM GOAL #1   Title Patient will increase FOTO score to equal to or greater than 47  to demonstrate statistically significant  improvement in mobility and quality of life.    Baseline 9/7: 34; 9/26: 48    Time 5    Period Weeks    Status Achieved      PT LONG TERM GOAL #2   Title Patient (> 63 years old) will complete five times sit to stand test in < 15 seconds indicating decreased fall risk.    Baseline 9/7: 18.5 sec; 9/26: 16.26 sec hands free    Time 5    Period Weeks    Status Partially Met  Target Date 08/13/21      PT LONG TERM GOAL #3   Title Patient will increase 10 meter walk test to >1.61m/s with WFL B step-length for 100% of test to improve ease with gait and to override hypokentic and bradykinetic movement.    Baseline 9/7: 0.74 m/s, decreased B step length and rigidty of movement; 9/26 deferred    Time 5    Period Weeks    Status On-going    Target Date 08/13/21      PT LONG TERM GOAL #4   Title Patient will reduce timed up and go to <11 seconds to reduce fall risk and demonstrate improved transfer/gait ability and ability to perform turns safely    Baseline 9/7: 17 seconds 9/26: deferred    Time 5    Period Weeks    Status On-going      PT LONG TERM GOAL #5   Title Pt will demo or report ability to open standard jar using only 1-2 attempts in order to exhibit improved functional mobility for ADLs.    Baseline 9/7: Pt reports she is unable to open jars; 9/26: pt ableto open in 2 attempts    Time 5    Period Weeks    Status Achieved                   Plan - 07/30/21 1318     Clinical Impression Statement Pt with improved activity tolerance today, with no reports of pain with interventions, and requiring fewer rest breaks. Pt was able to return to performing gait training without AD (but with close CGA). The pt shows improved stability ambulating over a compliant surface. The pt is indepndent with majority of functional component tasks. She did show some confusion with STS technique today (confusing it with another exercise). Pt also still requires frequent TC to LUE for  armswing. The pt will benefit from further skilled LSVT BIG PT treatments to improve mobility, gait, and balance to increase ease and safety with ADLs .    Personal Factors and Comorbidities Age;Sex;Fitness;Time since onset of injury/illness/exacerbation;Comorbidity 1;Comorbidity 3+;Comorbidity 2    Comorbidities MCI, HTN, CA, cataracts, bipolar 1, GAD, insomnia, chronic LBP    Examination-Activity Limitations Bend;Sit;Squat;Stairs;Stand;Locomotion Level;Self Feeding;Bathing;Transfers;Lift;Bed Mobility    Examination-Participation Restrictions Cleaning;Community Activity;Shop;Yard Work;Meal Prep;Personal Finances;Driving    Stability/Clinical Decision Making Evolving/Moderate complexity    Rehab Potential Fair    Clinical Impairments Affecting Rehab Potential MCI, chronic back pain    PT Frequency 4x / week    PT Duration Other (comment)   5 weeks   PT Treatment/Interventions ADLs/Self Care Home Management;Therapeutic exercise;Patient/family education;Neuromuscular re-education;Therapeutic activities;Functional mobility training;Balance training;Gait training;Stair training;Taping;Energy conservation;Passive range of motion;DME Instruction;Cognitive remediation    PT Next Visit Plan Initiate LSVT BIG Maximal daily exercises (see note for adaptions), functional component tasks, hierarchy tasks, carryover tasks, big gait/walking, TUG, 15mwt    PT Home Exercise Plan LSVT 7 adapted maximal daily exercises (exercies 5 and 7 performed with seated versions), 5 functional component tasks, hierarchy tasks, big walking, and carryover task (updated 9/28)    Consulted and Agree with Plan of Care Patient             Patient will benefit from skilled therapeutic intervention in order to improve the following deficits and impairments:  Abnormal gait, Decreased activity tolerance, Decreased cognition, Decreased endurance, Decreased knowledge of use of DME, Decreased range of motion, Decreased strength,  Hypomobility, Dizziness, Pain, Improper body mechanics, Impaired UE functional use, Decreased balance,  Decreased coordination, Decreased mobility, Difficulty walking, Increased edema, Impaired flexibility, Increased muscle spasms, Postural dysfunction  Visit Diagnosis: Other abnormalities of gait and mobility  Other lack of coordination  Unsteadiness on feet  Parkinson disease ([place])     Problem List Patient Active Problem List   Diagnosis Date Noted   Chronic bilateral low back pain 06/18/2021   Elevated blood pressure reading 05/23/2021   Osteopenia of neck of right femur 09/06/2020   Obesity 08/29/2020   Parkinson disease ([place]) 02/22/2020   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   Bipolar disorder, in full remission, most recent episode depressed ([place]) 11/08/2019   Avitaminosis D 08/24/2019   Insomnia due to mental condition 05/31/2019   Bipolar I disorder, most recent episode depressed ([place]) 05/31/2019   Alcohol use disorder, moderate, in sustained remission ([place]) 05/31/2019   Elevated tumor markers 05/27/2019   GAD (generalized anxiety disorder) 04/10/2019   MDD (major depressive disorder), severe ([place]) 01/31/2019   High serum carbohydrate antigen 19-9 (CA19-9) 12/01/2018   Adjustment disorder with anxious mood 11/24/2018   Essential hypertension 11/24/2018   B12 deficiency 10/17/2018   Incisional hernia, without obstruction or gangrene 09/12/2018   Genetic testing 04/07/2018   Malignant neoplasm of upper-outer quadrant of right breast in female, estrogen receptor positive ([place]) 03/17/2018   Malignant neoplasm of upper-inner quadrant of right breast in female, estrogen receptor positive ([place]) 03/17/2018   History of psychosis 02/07/2018   History of insomnia 02/07/2018    Zollie Pee, PT 07/30/2021, 1:21 PM  [place] MAIN [facility] SERVICES [place], [place], [address] Phone: [phone]   Fax:  [phone]  Name: Kendra [name] MRN: [id number] Date of Birth: 05/31/1946

## 2021-07-31 ENCOUNTER — Ambulatory Visit: Payer: Medicare PPO | Admitting: Speech Pathology

## 2021-07-31 ENCOUNTER — Ambulatory Visit: Payer: Medicare PPO

## 2021-07-31 DIAGNOSIS — R278 Other lack of coordination: Secondary | ICD-10-CM | POA: Diagnosis not present

## 2021-07-31 DIAGNOSIS — M79604 Pain in right leg: Secondary | ICD-10-CM | POA: Diagnosis not present

## 2021-07-31 DIAGNOSIS — R2681 Unsteadiness on feet: Secondary | ICD-10-CM

## 2021-07-31 DIAGNOSIS — M6281 Muscle weakness (generalized): Secondary | ICD-10-CM | POA: Diagnosis not present

## 2021-07-31 DIAGNOSIS — R2689 Other abnormalities of gait and mobility: Secondary | ICD-10-CM | POA: Diagnosis not present

## 2021-07-31 DIAGNOSIS — G20 Parkinson's disease: Secondary | ICD-10-CM

## 2021-07-31 DIAGNOSIS — R471 Dysarthria and anarthria: Secondary | ICD-10-CM | POA: Diagnosis not present

## 2021-07-31 DIAGNOSIS — M79605 Pain in left leg: Secondary | ICD-10-CM

## 2021-07-31 NOTE — Therapy (Signed)
[place] MAIN [facility] SERVICES [address] [place], [place], [address] Phone: [phone]   Fax:  [phone]  Physical Therapy Treatment  Patient Details  Name: Kendra [name] MRN: [id number] Date of Birth: 1946/02/28 Referring Provider (PT): Arbutus Ped., MD   Encounter Date: 07/31/2021   PT End of Session - 07/31/21 1303     Visit Number 13    Number of Visits 17    Date for PT Re-Evaluation 08/13/21    Authorization Type LSVT BIG    Authorization Time Period Evaluation performed on 07/09/2021    PT Start Time 1003    PT Stop Time 1100    PT Time Calculation (min) 57 min    Equipment Utilized During Treatment Gait belt    Activity Tolerance Patient tolerated treatment well    Behavior During Therapy [facility] for tasks assessed/performed             Past Medical History:  Diagnosis Date   Anxiety    Breast cancer ([place]) 03/2018   right breast cancer at 11:00 and 1:00   Bursitis    bilateral hips and knees   Complication of anesthesia    Depression    Family history of breast cancer 03/24/2018   Family history of lung cancer 03/24/2018   History of alcohol dependence ([place]) 2007   no ETOH; resolved since 2007   History of hiatal hernia    History of psychosis 2014   due to sleep disturbance   Hypertension    Parkinson disease ([place])    Personal history of radiation therapy 06/2018-07/2018   right breast ca   PONV (postoperative nausea and vomiting)     Past Surgical History:  Procedure Laterality Date   BREAST BIOPSY Right 03/14/2018   11:00 DCIS and invasive ductal carcinoma   BREAST BIOPSY Right 03/14/2018   1:00 Invasive ductal carcinoma   BREAST LUMPECTOMY Right 04/27/2018   lumpectomy of 11 and 1:00 cancers, clear margins, negative LN   BREAST LUMPECTOMY WITH RADIOACTIVE SEED AND SENTINEL LYMPH NODE BIOPSY Right 04/27/2018   Procedure: RIGHT BREAST LUMPECTOMY WITH RADIOACTIVE SEED X 2 AND RIGHT SENTINEL LYMPH  NODE BIOPSY;  Surgeon: [name], MD;  Location: [place];  Service: General;  Laterality: Right;   CATARACT EXTRACTION W/PHACO Left 08/30/2019   Procedure: CATARACT EXTRACTION PHACO AND INTRAOCULAR LENS PLACEMENT ([place]) LEFT panoptix toric  01:20.5  12.7%  10.26;  Surgeon: Leandrew Koyanagi, MD;  Location: [place];  Service: Ophthalmology;  Laterality: Left;   CATARACT EXTRACTION W/PHACO Right 09/20/2019   Procedure: CATARACT EXTRACTION PHACO AND INTRAOCULAR LENS PLACEMENT ([facility]) RIGHT 5.71  00:36.4  15.7%;  Surgeon: Leandrew Koyanagi, MD;  Location: [place];  Service: Ophthalmology;  Laterality: Right;   LAPAROTOMY N/A 02/10/2018   Procedure: EXPLORATORY LAPAROTOMY;  Surgeon: Isabel Caprice, MD;  Location: [facility];  Service: Gynecology;  Laterality: N/A;   MASS EXCISION  01/2018   abdominal   OVARIAN CYST REMOVAL     SALPINGOOPHORECTOMY Bilateral 02/10/2018   Procedure: BILATERAL SALPINGO OOPHORECTOMY; PERITONEAL WASHINGS;  Surgeon: Isabel Caprice, MD;  Location: [facility];  Service: Gynecology;  Laterality: Bilateral;   TONSILLECTOMY     TONSILLECTOMY AND ADENOIDECTOMY  1953    There were no vitals filed for this visit.   Subjective Assessment - 07/31/21 1003     Subjective Pt reports 8/10 LLE  pain. She says she took pain medication 20 min prior to appointment.  Patient is accompained by: Family member   spouse, [name]   Pertinent History Pt presents to PT for LSVT BIG evaluation. Pt diagnosed with PD in 2019. Pt reports difficulty with gait speed, freezing, transfers, balance, cognition, and use of hands d/t tremors. Pt has previously attended PT for treatment of back pain. Per chart other PMH includes hx of breast cancer, HTN, MCI, insomnia, cataracts, bipolar 1, alcohol use disorder, GAD, MDD, osteopenia of neck of R femur, chronic B low back pain with radiating pain to buttock and posterior thighs and calves    Limitations  Sitting;Standing;Walking;Reading;Lifting;Writing;House hold activities    How long can you sit comfortably? limited if not well positioned. Feet must be propped up.    How long can you stand comfortably? not comfortable, pain is constant    How long can you walk comfortably? 5 min    Diagnostic tests No recent imaging pertinent to PD per chart. Other most recent imaging per Chancis, Juanda Crumble note 06/27/2021: 'MRI lumbar spine without contrast [facility] dated 01/31/2020 imaging and report reviewed today. L5-S1 central disc bulging with severe bilateral facet arthropathy no central or foraminal stenosis. L4-5 grade 1 anterolisthesis with severe bilateral facet arthropathy and ligamentum flavum hypertrophy. She has severe right and moderate to severe left foraminal stenosis. Moderate central stenosis. L3-4 broad-based disc bulging with severe bilateral facet arthropathy and ligamentum flavum hypertrophy. Severe central stenosis. Moderate severe subarticular stenosis. No foraminal stenosis. L2-3 diffuse disc bulging with severe bilateral facet arthropathy and ligamentum flavum hypertrophy. Severe central stenosis and severe bilateral subarticular recess stenosis. Moderate left and mild to moderate right foraminal stenosis. L1-2 broad-based disc bulging with mild bilateral facet arthropathy. No central or foraminal stenosis."    Patient Stated Goals Pt would like to be able to walk faster, get up from a chair more easily    Currently in Pain? Yes    Pain Score 8     Pain Location Leg    Pain Orientation Left    Pain Onset More than a month ago    Pain Onset More than a month ago             LSVT BIG Treatment:   Patient seen for LSVT Daily Session Maximal Daily Exercises for facilitation/coordination of movement Sustained movements are designed to rescale the amplitude of movement output for generalization to daily functional activities.   Adapted Maximal Daily Exercises: Floor to Ceiling 10  with 10 second hold;  Side to Side 10 Bilateral with 10 second hold  Forward Step and Reach 12 Bilateral ;  Sideways Step and Reach 12 Bilateral Backwards Step and Reach 12 Bilateral - modified to Seated Maximal Daily Exercise  Forwards Rock and Reach 12 Bilateral Sideways Rock and Reach 12 Bilateral - modified to Seated Maximal Daily exercise    Daily Exercise Comments: Pt performing exercises 5 and 7 seated. Pt able to perform all maximal daily exercises without a rest break. Pt able to "teach back" technique of majority of maximal daily exercises to PT. Pt is >90% independent with exercises 1-4. Pt overall is approx 50% independent with exercises 5-7.  Functional Component Tasks - performance and progress: Sit to Stand x 5 from standard chair 2. Walk and turn 5x  3. Bend and reach 5x 4. Open Jar 5x  - goal  5. Step up on step 5x Comments: Pt with improved turning today. Pt largely independent with functional component tasks, with minimal cuing for STS technique.    Hierarchy  Tasks - performance and progress: Contact guard assist to min assist provided throughout unless otherwise noted for all upright tasks Going for a walk outside (uneven, varied surfaces); Pt not ready for outside ambulation, focus on ambulating over compliant surface as this triggers pts sx (bradykinesia, hypokinesia, shuffling).  Pt performs ambulating over compliant surface for several minutes, 2x requiring UE support to regain balance. Pt also performs several turns over compliant surface. After this intervention pt requests rest break.    2. Handwriting - signing name "my name is ____ and today's date is ____" Pt independent with this activity.    3. Ascending/descending stairs; big walk up to stairs with big reach to handrails, upright posture and big ascend/descent down stairs. PT providing close CGA. Pt requires minimal cuing for upright posture. She reports FOF with descending steps. Pt uses BUE support on  handrails.    Gait - performance and progress: 2x148 laps around clinic/in hallway without AD but with close CGA. Continued TC provided to LUE to improve arm-swing, as this is first part of gait mechanics to worsen with pt attending to big-steps and upright posture simultaneously. Pt then ambulates >100 ft pushing transport chair to speech appointment down hall. She is able to sustain increased BLE step-length with use of transport chair for UE support.   Carryover Assignment: Updated on this date via handout, emphasis on pt walking big with big arm-swing throughout weekend    Homework: Pt to perform all 7 LSVT BIG exercises (adapted and seated for 5 and 7 see above), 5 Functional Component Tasks and big walking.  Reinforced patient in safety with adapted and seated maximal daily exercises.  PT continues to reinforce the following safety instructions from prior sessions. PT instructed patient on use of upper extremity support with exercises.  Patient verbalized understanding. Updated and instructed pt in carryover task.     Pt educated throughout session about proper posture and technique with exercises. Improved exercise technique, movement at target joints, use of target muscles after min to mod verbal, visual, tactile cues.  note: Portions of this document were prepared using Dragon voice recognition software and although reviewed may contain unintentional dictation errors in syntax, grammar, or spelling.    PT Education - 07/31/21 1303     Education provided Yes    Education Details exercise technique, body mechanics    Person(s) Educated Patient    Methods Explanation;Demonstration;Tactile cues;Verbal cues;Handout    Comprehension Verbalized understanding;Returned demonstration;Verbal cues required;Tactile cues required;Need further instruction              PT Short Term Goals - 07/28/21 1513       PT SHORT TERM GOAL #1   Title Pt will demonstrate independence with LSVT BIG HEP  to improve strength/mobility for improved safety and ease with ADLs and to maintain/continue gains beyond LSVT program.    Baseline 9/7: to be initiated next session; 9/26: pt making gains, more indepndent with exercises, but still requires some cuing    Time 2    Period Weeks    Status On-going    Target Date 08/11/21               PT Long Term Goals - 07/28/21 1514       PT LONG TERM GOAL #1   Title Patient will increase FOTO score to equal to or greater than 47  to demonstrate statistically significant improvement in mobility and quality of life.    Baseline 9/7: 34; 9/26: 48  Time 5    Period Weeks    Status Achieved      PT LONG TERM GOAL #2   Title Patient (> 73 years old) will complete five times sit to stand test in < 15 seconds indicating decreased fall risk.    Baseline 9/7: 18.5 sec; 9/26: 16.26 sec hands free    Time 5    Period Weeks    Status Partially Met    Target Date 08/13/21      PT LONG TERM GOAL #3   Title Patient will increase 10 meter walk test to >1.71m/s with WFL B step-length for 100% of test to improve ease with gait and to override hypokentic and bradykinetic movement.    Baseline 9/7: 0.74 m/s, decreased B step length and rigidty of movement; 9/26 deferred    Time 5    Period Weeks    Status On-going    Target Date 08/13/21      PT LONG TERM GOAL #4   Title Patient will reduce timed up and go to <11 seconds to reduce fall risk and demonstrate improved transfer/gait ability and ability to perform turns safely    Baseline 9/7: 17 seconds 9/26: deferred    Time 5    Period Weeks    Status On-going      PT LONG TERM GOAL #5   Title Pt will demo or report ability to open standard jar using only 1-2 attempts in order to exhibit improved functional mobility for ADLs.    Baseline 9/7: Pt reports she is unable to open jars; 9/26: pt ableto open in 2 attempts    Time 5    Period Weeks    Status Achieved                   Plan -  07/31/21 1645     Clinical Impression Statement Pt shows improved acitvity tolerance/endurance where she did not require a rest break with maximal daily exercises. Pt was also able to teach back technique with majority of exercises, although she does still require approx 50% cuing for maximal daily exercises 5-7. Pt is >95% independent with max. daily exercises 1-4. While pt shows improved activity tolerance, she still requires more frequent rest with ambulation. Pt also still challenged with arm-swing. The pt will benefit from further skilled LSVT BIG PT treatments to improve mobility, gait, strength and balance to increase ease and safety with ADLs.    Personal Factors and Comorbidities Age;Sex;Fitness;Time since onset of injury/illness/exacerbation;Comorbidity 1;Comorbidity 3+;Comorbidity 2    Comorbidities MCI, HTN, CA, cataracts, bipolar 1, GAD, insomnia, chronic LBP    Examination-Activity Limitations Bend;Sit;Squat;Stairs;Stand;Locomotion Level;Self Feeding;Bathing;Transfers;Lift;Bed Mobility    Examination-Participation Restrictions Cleaning;Community Activity;Shop;Yard Work;Meal Prep;Personal Finances;Driving    Stability/Clinical Decision Making Evolving/Moderate complexity    Rehab Potential Fair    Clinical Impairments Affecting Rehab Potential MCI, chronic back pain    PT Frequency 4x / week    PT Duration Other (comment)   5 weeks   PT Treatment/Interventions ADLs/Self Care Home Management;Therapeutic exercise;Patient/family education;Neuromuscular re-education;Therapeutic activities;Functional mobility training;Balance training;Gait training;Stair training;Taping;Energy conservation;Passive range of motion;DME Instruction;Cognitive remediation    PT Next Visit Plan Initiate LSVT BIG Maximal daily exercises (see note for adaptions), functional component tasks, hierarchy tasks, carryover tasks, big gait/walking, TUG,    PT Home Exercise Plan LSVT 7 adapted maximal daily exercises  (exercies 5 and 7 performed with seated versions), 5 functional component tasks, hierarchy tasks, big walking, and carryover task (updated 9/29)  Consulted and Agree with Plan of Care Patient             Patient will benefit from skilled therapeutic intervention in order to improve the following deficits and impairments:  Abnormal gait, Decreased activity tolerance, Decreased cognition, Decreased endurance, Decreased knowledge of use of DME, Decreased range of motion, Decreased strength, Hypomobility, Dizziness, Pain, Improper body mechanics, Impaired UE functional use, Decreased balance, Decreased coordination, Decreased mobility, Difficulty walking, Increased edema, Impaired flexibility, Increased muscle spasms, Postural dysfunction  Visit Diagnosis: Other abnormalities of gait and mobility  Other lack of coordination  Unsteadiness on feet  Muscle weakness (generalized)  Parkinson disease ([facility])  Pain in left leg     Problem List Patient Active Problem List   Diagnosis Date Noted   Chronic bilateral low back pain 06/18/2021   Elevated blood pressure reading 05/23/2021   Osteopenia of neck of right femur 09/06/2020   Obesity 08/29/2020   Parkinson disease ([place]) 02/22/2020   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   Bipolar disorder, in full remission, most recent episode depressed ([place]) 11/08/2019   Avitaminosis D 08/24/2019   Insomnia due to mental condition 05/31/2019   Bipolar I disorder, most recent episode depressed ([place]) 05/31/2019   Alcohol use disorder, moderate, in sustained remission ([place]) 05/31/2019   Elevated tumor markers 05/27/2019   GAD (generalized anxiety disorder) 04/10/2019   MDD (major depressive disorder), severe ([place]) 01/31/2019   High serum carbohydrate antigen 19-9 (CA19-9) 12/01/2018   Adjustment disorder with anxious mood 11/24/2018   Essential hypertension 11/24/2018   B12 deficiency 10/17/2018   Incisional hernia, without obstruction or gangrene  09/12/2018   Genetic testing 04/07/2018   Malignant neoplasm of upper-outer quadrant of right breast in female, estrogen receptor positive ([place]) 03/17/2018   Malignant neoplasm of upper-inner quadrant of right breast in female, estrogen receptor positive ([place]) 03/17/2018   History of psychosis 02/07/2018   History of insomnia 02/07/2018    Zollie Pee, PT 07/31/2021, 4:50 PM  [place] MAIN [facility] SERVICES [address] [place], [place], [address] Phone: [phone]   Fax:  [phone]  Name: Kendra [name] MRN: [id number] Date of Birth: 1946/03/23

## 2021-07-31 NOTE — Therapy (Signed)
[place] MAIN [facility] SERVICES [address] [place], [place], [address] Phone: [phone]   Fax:  [phone]  Speech Language Pathology Treatment  Patient Details  Name: Kendra [name] MRN: [id number] Date of Birth: 12-28-1945 Referring Provider (SLP): Jerrol Banana., MD   Encounter Date: 07/31/2021   End of Session - 07/31/21 1243     Visit Number 12    Number of Visits 17    Date for SLP Re-Evaluation 10/07/21    Authorization - Visit Number 3    Progress Note Due on Visit 10    SLP Start Time 1100    SLP Stop Time  1200    SLP Time Calculation (min) 60 min    Activity Tolerance Patient tolerated treatment well             Past Medical History:  Diagnosis Date   Anxiety    Breast cancer ([place]) 03/2018   right breast cancer at 11:00 and 1:00   Bursitis    bilateral hips and knees   Complication of anesthesia    Depression    Family history of breast cancer 03/24/2018   Family history of lung cancer 03/24/2018   History of alcohol dependence ([place]) 2007   no ETOH; resolved since 2007   History of hiatal hernia    History of psychosis 2014   due to sleep disturbance   Hypertension    Parkinson disease ([place])    Personal history of radiation therapy 06/2018-07/2018   right breast ca   PONV (postoperative nausea and vomiting)     Past Surgical History:  Procedure Laterality Date   BREAST BIOPSY Right 03/14/2018   11:00 DCIS and invasive ductal carcinoma   BREAST BIOPSY Right 03/14/2018   1:00 Invasive ductal carcinoma   BREAST LUMPECTOMY Right 04/27/2018   lumpectomy of 11 and 1:00 cancers, clear margins, negative LN   BREAST LUMPECTOMY WITH RADIOACTIVE SEED AND SENTINEL LYMPH NODE BIOPSY Right 04/27/2018   Procedure: RIGHT BREAST LUMPECTOMY WITH RADIOACTIVE SEED X 2 AND RIGHT SENTINEL LYMPH NODE BIOPSY;  Surgeon: [name], MD;  Location: [place];  Service: General;  Laterality: Right;    CATARACT EXTRACTION W/PHACO Left 08/30/2019   Procedure: CATARACT EXTRACTION PHACO AND INTRAOCULAR LENS PLACEMENT ([place]) LEFT panoptix toric  01:20.5  12.7%  10.26;  Surgeon: Leandrew Koyanagi, MD;  Location: [facility];  Service: Ophthalmology;  Laterality: Left;   CATARACT EXTRACTION W/PHACO Right 09/20/2019   Procedure: CATARACT EXTRACTION PHACO AND INTRAOCULAR LENS PLACEMENT ([facility]) RIGHT 5.71  00:36.4  15.7%;  Surgeon: Leandrew Koyanagi, MD;  Location: [facility];  Service: Ophthalmology;  Laterality: Right;   LAPAROTOMY N/A 02/10/2018   Procedure: EXPLORATORY LAPAROTOMY;  Surgeon: Isabel Caprice, MD;  Location[facility];  Service: Gynecology;  Laterality: N/A;   MASS EXCISION  01/2018   abdominal   OVARIAN CYST REMOVAL     SALPINGOOPHORECTOMY Bilateral 02/10/2018   Procedure: BILATERAL SALPINGO OOPHORECTOMY; PERITONEAL WASHINGS;  Surgeon: Isabel Caprice, MD;  Location[facility];  Service: Gynecology;  Laterality: Bilateral;   TONSILLECTOMY     TONSILLECTOMY AND ADENOIDECTOMY  1953    There were no vitals filed for this visit.   Subjective Assessment - 07/31/21 1242     Subjective "I thought you were first today."    Currently in Pain? No/denies   none while seated                  ADULT  SLP TREATMENT - 07/31/21 1242       General Information   Behavior/Cognition Alert;Cooperative    HPI Patient is a 75 y.o. female with history of Parkinson's disease, anxiety, depression, psychosis, HTN. bipolar disorder, alcoholism, memory issues since 2005, hx head trauma. Referred for LSVT BIG and LOUD.      Cognitive-Linquistic Treatment   Treatment focused on Dysarthria;Patient/family/caregiver education    Skilled Treatment LSVT Daily tasks with min cues. Loud /a/ averaged 83 dB, duration 15.6 seconds, pitch avg 238 Hz.  Max F0 for pitch glides was 292 Hz (84dB avg), min F0 for low glides was 175 Hz (85 dB avg). Functional phrases averaged 82 dB with modified  independence. Hierarchical loudness drills at multiparagraph reading level averaged 80 dB, with occasional min cues for loudness/effort level. Pt required verbal cues to maintain effort/loudness when SLP assisted with transport upstairs for her ride after the session.          Assessment / Recommendations / Plan   Plan Continue with current plan of care      Progression Toward Goals   Progression toward goals Progressing toward goals              SLP Education - 07/31/21 1242     Education Details Loud outside of ST room    Person(s) Educated Patient    Methods Explanation    Comprehension Verbalized understanding              SLP Short Term Goals - 07/29/21 1555       SLP SHORT TERM GOAL #1   Title The patient will complete Daily Tasks (Maximum duration "ah", High/Lows, and Functional Phrases) at average loudness >/= 85 dB and with loud, good quality voice with min cues.    Time 10    Period --   sessions   Status Achieved      SLP SHORT TERM GOAL #2   Title The patient will complete Hierarchal Speech Loudness reading drills (words/phrases, sentences) at average >/= 75 dB and with loud, good quality voice with min cues.    Time 10    Period --   sessions   Status Achieved      SLP SHORT TERM GOAL #3   Title The patient will participate in 5-8 minutes conversation, maintaining average loudness of 75 dB and loud, good quality voice with min cues.    Time 10    Period --   sessions   Status Achieved              SLP Long Term Goals - 07/29/21 1556       SLP LONG TERM GOAL #1   Title The patient will complete Daily Tasks (Maximum duration "ah", High/Lows, and Functional Phrases) at average loudness >/= 85 dB and with loud, good quality voice    Time 4    Period Weeks   or 17 sessions, for all LTGs   Status On-going      SLP LONG TERM GOAL #2   Title The patient will complete Hierarchal Speech Loudness reading drills (words/phrases, sentences, and  paragraph) at average >/= 75 dB and with loud, good quality voice.    Time 4    Period Weeks    Status On-going      SLP LONG TERM GOAL #3   Title The patient will participate in 15-20 minutes conversation, maintaining average loudness of >/= 75 dB and loud, good quality voice.    Time 4  Period Weeks    Status On-going      SLP LONG TERM GOAL #4   Title Patient will report improved communication effectiveness as measured by Communicative Effectiveness Survey.    Baseline 19/32 at eval    Time 4    Period Weeks    Status On-going              Plan - 07/31/21 1245     Clinical Impression Statement Patient presents with mild hypokinetic dysarthria. Continues to make steady progress toward goals and is beginning to demonstrate carryover to conversation for short periods. Vocal quality and endurance improving in structured tasks. Reading at paragraph level tasks today, with occasional min verbal cues necessary to maintain vocal intensity. Outside ST room (on way upstairs and in lobby) pt required frequent verbal cues to maintain vocal intensity. I recommend skilled ST for LSVT LOUD 4x a week for 4 weeks in order to improve vocal quality, intelligibility, and communicative effectiveness.    Speech Therapy Frequency 4x / week    Duration 4 weeks    Treatment/Interventions Language facilitation;Environmental controls;Cueing hierarchy;SLP instruction and feedback;Compensatory techniques;Functional tasks;Patient/family education;Multimodal communcation approach   LSVT LOUD   Potential to Achieve Goals Good    Potential Considerations Ability to learn/carryover information    SLP Home Exercise Plan to be provided next session    Consulted and Agree with Plan of Care Patient;Family member/caregiver    Family Member Consulted spouse             Patient will benefit from skilled therapeutic intervention in order to improve the following deficits and impairments:   Dysarthria and  anarthria  Parkinson disease ([place])    Problem List Patient Active Problem List   Diagnosis Date Noted   Chronic bilateral low back pain 06/18/2021   Elevated blood pressure reading 05/23/2021   Osteopenia of neck of right femur 09/06/2020   Obesity 08/29/2020   Parkinson disease ([place]) 02/22/2020   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   Bipolar disorder, in full remission, most recent episode depressed ([place]) 11/08/2019   Avitaminosis D 08/24/2019   Insomnia due to mental condition 05/31/2019   Bipolar I disorder, most recent episode depressed ([place]) 05/31/2019   Alcohol use disorder, moderate, in sustained remission ([place]) 05/31/2019   Elevated tumor markers 05/27/2019   GAD (generalized anxiety disorder) 04/10/2019   MDD (major depressive disorder), severe ([place]) 01/31/2019   High serum carbohydrate antigen 19-9 (CA19-9) 12/01/2018   Adjustment disorder with anxious mood 11/24/2018   Essential hypertension 11/24/2018   B12 deficiency 10/17/2018   Incisional hernia, without obstruction or gangrene 09/12/2018   Genetic testing 04/07/2018   Malignant neoplasm of upper-outer quadrant of right breast in female, estrogen receptor positive ([place]) 03/17/2018   Malignant neoplasm of upper-inner quadrant of right breast in female, estrogen receptor positive ([place]) 03/17/2018   History of psychosis 02/07/2018   History of insomnia 02/07/2018   Deneise Lever, [place], CCC-SLP Speech-Language Pathologist   Aliene Altes 07/31/2021, 12:46 PM  [facility] [address] [place], [place], [address] Phone: [phone]   Fax:  [phone]   Name: Kendra [name] MRN: [id number] Date of Birth: 19-Aug-1946

## 2021-08-04 ENCOUNTER — Encounter: Payer: Medicare PPO | Admitting: Physical Therapy

## 2021-08-04 ENCOUNTER — Ambulatory Visit: Payer: Medicare PPO

## 2021-08-04 ENCOUNTER — Other Ambulatory Visit: Payer: Self-pay

## 2021-08-04 ENCOUNTER — Ambulatory Visit: Payer: Medicare PPO | Attending: Family Medicine | Admitting: Speech Pathology

## 2021-08-04 DIAGNOSIS — R2681 Unsteadiness on feet: Secondary | ICD-10-CM | POA: Diagnosis not present

## 2021-08-04 DIAGNOSIS — R2689 Other abnormalities of gait and mobility: Secondary | ICD-10-CM | POA: Diagnosis not present

## 2021-08-04 DIAGNOSIS — M6281 Muscle weakness (generalized): Secondary | ICD-10-CM | POA: Diagnosis not present

## 2021-08-04 DIAGNOSIS — R278 Other lack of coordination: Secondary | ICD-10-CM | POA: Insufficient documentation

## 2021-08-04 DIAGNOSIS — M79605 Pain in left leg: Secondary | ICD-10-CM | POA: Insufficient documentation

## 2021-08-04 DIAGNOSIS — G20 Parkinson's disease: Secondary | ICD-10-CM | POA: Insufficient documentation

## 2021-08-04 DIAGNOSIS — R471 Dysarthria and anarthria: Secondary | ICD-10-CM | POA: Diagnosis not present

## 2021-08-04 NOTE — Therapy (Signed)
[place] MAIN [facility] SERVICES [address] [place], [place], [address] Phone: [phone]   Fax:  [phone]  Speech Language Pathology Treatment  Patient Details  Name: Kendra [name] MRN: [id number] Date of Birth: June 05, 1946 Referring Provider (SLP): Jerrol Banana., MD   Encounter Date: 08/04/2021   End of Session - 08/04/21 1543     Visit Number 13    Number of Visits 17    Date for SLP Re-Evaluation 10/07/21    Authorization - Visit Number 3    Progress Note Due on Visit 10    SLP Start Time 1000    SLP Stop Time  1100    SLP Time Calculation (min) 60 min             Past Medical History:  Diagnosis Date   Anxiety    Breast cancer ([place]) 03/2018   right breast cancer at 11:00 and 1:00   Bursitis    bilateral hips and knees   Complication of anesthesia    Depression    Family history of breast cancer 03/24/2018   Family history of lung cancer 03/24/2018   History of alcohol dependence ([place]) 2007   no ETOH; resolved since 2007   History of hiatal hernia    History of psychosis 2014   due to sleep disturbance   Hypertension    Parkinson disease ([place])    Personal history of radiation therapy 06/2018-07/2018   right breast ca   PONV (postoperative nausea and vomiting)     Past Surgical History:  Procedure Laterality Date   BREAST BIOPSY Right 03/14/2018   11:00 DCIS and invasive ductal carcinoma   BREAST BIOPSY Right 03/14/2018   1:00 Invasive ductal carcinoma   BREAST LUMPECTOMY Right 04/27/2018   lumpectomy of 11 and 1:00 cancers, clear margins, negative LN   BREAST LUMPECTOMY WITH RADIOACTIVE SEED AND SENTINEL LYMPH NODE BIOPSY Right 04/27/2018   Procedure: RIGHT BREAST LUMPECTOMY WITH RADIOACTIVE SEED X 2 AND RIGHT SENTINEL LYMPH NODE BIOPSY;  Surgeon: [name], MD;  Location: [facility];  Service: General;  Laterality: Right;   CATARACT EXTRACTION W/PHACO Left 08/30/2019    Procedure: CATARACT EXTRACTION PHACO AND INTRAOCULAR LENS PLACEMENT ([place]) LEFT panoptix toric  01:20.5  12.7%  10.26;  Surgeon: Leandrew Koyanagi, MD;  Location: [facility];  Service: Ophthalmology;  Laterality: Left;   CATARACT EXTRACTION W/PHACO Right 09/20/2019   Procedure: CATARACT EXTRACTION PHACO AND INTRAOCULAR LENS PLACEMENT ([facility]) RIGHT 5.71  00:36.4  15.7%;  Surgeon: Leandrew Koyanagi, MD;  Location: [facility];  Service: Ophthalmology;  Laterality: Right;   LAPAROTOMY N/A 02/10/2018   Procedure: EXPLORATORY LAPAROTOMY;  Surgeon: Isabel Caprice, MD;  [facility];  Service: Gynecology;  Laterality: N/A;   MASS EXCISION  01/2018   abdominal   OVARIAN CYST REMOVAL     SALPINGOOPHORECTOMY Bilateral 02/10/2018   Procedure: BILATERAL SALPINGO OOPHORECTOMY; PERITONEAL WASHINGS;  Surgeon: Isabel Caprice, MD;  [facility];  Service: Gynecology;  Laterality: Bilateral;   TONSILLECTOMY     TONSILLECTOMY AND ADENOIDECTOMY  1953    There were no vitals filed for this visit.   Subjective Assessment - 08/04/21 1542     Subjective Greeted with loud voice/big movement    Currently in Pain? No/denies                   ADULT SLP TREATMENT - 08/04/21 1543       General Information  Behavior/Cognition Alert;Cooperative    HPI Patient is a 75 y.o. female with history of Parkinson's disease, anxiety, depression, psychosis, HTN. bipolar disorder, alcoholism, memory issues since 2005, hx head trauma. Referred for LSVT BIG and LOUD.      Cognitive-Linquistic Treatment   Treatment focused on Dysarthria;Patient/family/caregiver education    Skilled Treatment LSVT Daily tasks with occasional min cues. Loud /a/ averaged 83 dB, duration 14.5 seconds.  Max F0 for pitch glides was 286 dB Hz (85 dB avg), min F0 for low glides was 177 Hz (84 dB avg). Functional phrases averaged 83 dB with modified independence. Hierarchical loudness drills at mulitparagraph level  averaged 80 dB, with occasional min cues for loudness, effort level.        Assessment / Recommendations / Plan   Plan Continue with current plan of care      Progression Toward Goals   Progression toward goals Progressing toward goals              SLP Education - 08/04/21 1543     Education Details more difficult to be loud when concentrating    Person(s) Educated Patient    Methods Explanation    Comprehension Verbalized understanding              SLP Short Term Goals - 07/29/21 1555       SLP SHORT TERM GOAL #1   Title The patient will complete Daily Tasks (Maximum duration "ah", High/Lows, and Functional Phrases) at average loudness >/= 85 dB and with loud, good quality voice with min cues.    Time 10    Period --   sessions   Status Achieved      SLP SHORT TERM GOAL #2   Title The patient will complete Hierarchal Speech Loudness reading drills (words/phrases, sentences) at average >/= 75 dB and with loud, good quality voice with min cues.    Time 10    Period --   sessions   Status Achieved      SLP SHORT TERM GOAL #3   Title The patient will participate in 5-8 minutes conversation, maintaining average loudness of 75 dB and loud, good quality voice with min cues.    Time 10    Period --   sessions   Status Achieved              SLP Long Term Goals - 07/29/21 1556       SLP LONG TERM GOAL #1   Title The patient will complete Daily Tasks (Maximum duration "ah", High/Lows, and Functional Phrases) at average loudness >/= 85 dB and with loud, good quality voice    Time 4    Period Weeks   or 17 sessions, for all LTGs   Status On-going      SLP LONG TERM GOAL #2   Title The patient will complete Hierarchal Speech Loudness reading drills (words/phrases, sentences, and paragraph) at average >/= 75 dB and with loud, good quality voice.    Time 4    Period Weeks    Status On-going      SLP LONG TERM GOAL #3   Title The patient will participate in  15-20 minutes conversation, maintaining average loudness of >/= 75 dB and loud, good quality voice.    Time 4    Period Weeks    Status On-going      SLP LONG TERM GOAL #4   Title Patient will report improved communication effectiveness as measured by Communicative Effectiveness Survey.  Baseline 19/32 at eval    Time 4    Period Weeks    Status On-going              Plan - 08/04/21 1544     Clinical Impression Statement Patient presents with mild hypokinetic dysarthria. Continues to make steady progress toward goals and is beginning to demonstrate carryover to conversation for short periods. Vocal quality and endurance improving in structured tasks; some fatigue in last 10 min of session with multiparagraph reading. I recommend skilled ST for LSVT LOUD 4x a week for 4 weeks in order to improve vocal quality, intelligibility, and communicative effectiveness.    Speech Therapy Frequency 4x / week    Duration 4 weeks    Treatment/Interventions Language facilitation;Environmental controls;Cueing hierarchy;SLP instruction and feedback;Compensatory techniques;Functional tasks;Patient/family education;Multimodal communcation approach   LSVT LOUD   Potential to Achieve Goals Good    Potential Considerations Ability to learn/carryover information    SLP Home Exercise Plan to be provided next session    Consulted and Agree with Plan of Care Patient;Family member/caregiver    Family Member Consulted spouse             Patient will benefit from skilled therapeutic intervention in order to improve the following deficits and impairments:   Dysarthria and anarthria    Problem List Patient Active Problem List   Diagnosis Date Noted   Chronic bilateral low back pain 06/18/2021   Elevated blood pressure reading 05/23/2021   Osteopenia of neck of right femur 09/06/2020   Obesity 08/29/2020   Parkinson disease ([place]) 02/22/2020   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   Bipolar disorder, in  full remission, most recent episode depressed ([place]) 11/08/2019   Avitaminosis D 08/24/2019   Insomnia due to mental condition 05/31/2019   Bipolar I disorder, most recent episode depressed ([place]) 05/31/2019   Alcohol use disorder, moderate, in sustained remission ([place]) 05/31/2019   Elevated tumor markers 05/27/2019   GAD (generalized anxiety disorder) 04/10/2019   MDD (major depressive disorder), severe ([place]) 01/31/2019   High serum carbohydrate antigen 19-9 (CA19-9) 12/01/2018   Adjustment disorder with anxious mood 11/24/2018   Essential hypertension 11/24/2018   B12 deficiency 10/17/2018   Incisional hernia, without obstruction or gangrene 09/12/2018   Genetic testing 04/07/2018   Malignant neoplasm of upper-outer quadrant of right breast in female, estrogen receptor positive ([place]) 03/17/2018   Malignant neoplasm of upper-inner quadrant of right breast in female, estrogen receptor positive ([place]) 03/17/2018   History of psychosis 02/07/2018   History of insomnia 02/07/2018   Deneise Lever, [place], CCC-SLP Speech-Language Pathologist  Aliene Altes 08/04/2021, 3:45 PM  [place] MAIN [facility] SERVICES [address] [place], [place], [address] Phone: [phone]   Fax:  [phone]   Name: Kendra [name] MRN: [id number] Date of Birth: 1946/08/04

## 2021-08-04 NOTE — Therapy (Signed)
[facility] SERVICES [address] [place], [place], [address] Phone: [phone]   Fax:  [phone]  Physical Therapy Treatment  Patient Details  Name: Kendra [name] MRN: [id number] Date of Birth: [birth year] Referring Provider (PT): Arbutus Ped., MD   Encounter Date: 08/04/2021   PT End of Session - 08/04/21 1220     Visit Number 14    Number of Visits 17    Date for PT Re-Evaluation 08/13/21    Authorization Type LSVT BIG    Authorization Time Period Evaluation performed on 07/09/2021    PT Start Time 1108    PT Stop Time 1206    PT Time Calculation (min) 58 min    Equipment Utilized During Treatment Gait belt    Activity Tolerance Patient tolerated treatment well    Behavior During Therapy [facility] for tasks assessed/performed             Past Medical History:  Diagnosis Date   Anxiety    Breast cancer ([place]) 03/2018   right breast cancer at 11:00 and 1:00   Bursitis    bilateral hips and knees   Complication of anesthesia    Depression    Family history of breast cancer 03/24/2018   Family history of lung cancer 03/24/2018   History of alcohol dependence ([place]) 2007   no ETOH; resolved since 2007   History of hiatal hernia    History of psychosis 2014   due to sleep disturbance   Hypertension    Parkinson disease ([place])    Personal history of radiation therapy 06/2018-07/2018   right breast ca   PONV (postoperative nausea and vomiting)     Past Surgical History:  Procedure Laterality Date   BREAST BIOPSY Right 03/14/2018   11:00 DCIS and invasive ductal carcinoma   BREAST BIOPSY Right 03/14/2018   1:00 Invasive ductal carcinoma   BREAST LUMPECTOMY Right 04/27/2018   lumpectomy of 11 and 1:00 cancers, clear margins, negative LN   BREAST LUMPECTOMY WITH RADIOACTIVE SEED AND SENTINEL LYMPH NODE BIOPSY Right 04/27/2018   Procedure: RIGHT BREAST LUMPECTOMY WITH RADIOACTIVE SEED X 2 AND RIGHT SENTINEL LYMPH  NODE BIOPSY;  Surgeon: [name], MD;  Location: [place];  Service: General;  Laterality: Right;   CATARACT EXTRACTION W/PHACO Left 08/30/2019   Procedure: CATARACT EXTRACTION PHACO AND INTRAOCULAR LENS PLACEMENT ([place]) LEFT panoptix toric  01:20.5  12.7%  10.26;  Surgeon: Leandrew Koyanagi, MD;  Location: [place];  Service: Ophthalmology;  Laterality: Left;   CATARACT EXTRACTION W/PHACO Right 09/20/2019   Procedure: CATARACT EXTRACTION PHACO AND INTRAOCULAR LENS PLACEMENT ([facility]) RIGHT 5.71  00:36.4  15.7%;  Surgeon: Leandrew Koyanagi, MD;  Location: [place];  Service: Ophthalmology;  Laterality: Right;   LAPAROTOMY N/A 02/10/2018   Procedure: EXPLORATORY LAPAROTOMY;  Surgeon: Isabel Caprice, MD;  Location: [facility];  Service: Gynecology;  Laterality: N/A;   MASS EXCISION  01/2018   abdominal   OVARIAN CYST REMOVAL     SALPINGOOPHORECTOMY Bilateral 02/10/2018   Procedure: BILATERAL SALPINGO OOPHORECTOMY; PERITONEAL WASHINGS;  Surgeon: Isabel Caprice, MD;  Location: [facility];  Service: Gynecology;  Laterality: Bilateral;   TONSILLECTOMY     TONSILLECTOMY AND ADENOIDECTOMY  1953    There were no vitals filed for this visit.    Subjective Assessment - 08/04/21 1219     Subjective Pt reports she is having a good day since she has no pain today. Reports performing HEP.  Patient is accompained by: Family member   spouse, [name]   Pertinent History Pt presents to PT for LSVT BIG evaluation. Pt diagnosed with PD in 2019. Pt reports difficulty with gait speed, freezing, transfers, balance, cognition, and use of hands d/t tremors. Pt has previously attended PT for treatment of back pain. Per chart other PMH includes hx of breast cancer, HTN, MCI, insomnia, cataracts, bipolar 1, alcohol use disorder, GAD, MDD, osteopenia of neck of R femur, chronic B low back pain with radiating pain to buttock and posterior thighs and calves    Limitations  Sitting;Standing;Walking;Reading;Lifting;Writing;House hold activities    How long can you sit comfortably? limited if not well positioned. Feet must be propped up.    How long can you stand comfortably? not comfortable, pain is constant    How long can you walk comfortably? 5 min    Diagnostic tests No recent imaging pertinent to PD per chart. Other most recent imaging per Chancis, Juanda Crumble note 06/27/2021: 'MRI lumbar spine without contrast from [facility] dated 01/31/2020 imaging and report reviewed today. L5-S1 central disc bulging with severe bilateral facet arthropathy no central or foraminal stenosis. L4-5 grade 1 anterolisthesis with severe bilateral facet arthropathy and ligamentum flavum hypertrophy. She has severe right and moderate to severe left foraminal stenosis. Moderate central stenosis. L3-4 broad-based disc bulging with severe bilateral facet arthropathy and ligamentum flavum hypertrophy. Severe central stenosis. Moderate severe subarticular stenosis. No foraminal stenosis. L2-3 diffuse disc bulging with severe bilateral facet arthropathy and ligamentum flavum hypertrophy. Severe central stenosis and severe bilateral subarticular recess stenosis. Moderate left and mild to moderate right foraminal stenosis. L1-2 broad-based disc bulging with mild bilateral facet arthropathy. No central or foraminal stenosis."    Patient Stated Goals Pt would like to be able to walk faster, get up from a chair more easily    Currently in Pain? No/denies    Pain Onset More than a month ago    Pain Onset More than a month ago               LSVT BIG Treatment:   Patient seen for LSVT Daily Session Maximal Daily Exercises for facilitation/coordination of movement Sustained movements are designed to rescale the amplitude of movement output for generalization to daily functional activities.   Adapted Maximal Daily Exercises: Floor to Ceiling 10 with 10 second hold;  Side to Side 10 Bilateral with 10  second hold  Forward Step and Reach 15 Bilateral ;  Sideways Step and Reach 15 Bilateral Backwards Step and Reach 15 Bilateral - modified to Seated Maximal Daily Exercise  Forwards Rock and Reach 12 Bilateral Sideways Rock and Reach 12 Bilateral - modified to Seated Maximal Daily exercise    Daily Exercise Comments: Pt performing exercises 5 and 7 seated. Pt continues to perform all maximal daily exercises without a rest break and with increased repetitions, indicating improved endurance. Cuing for finger abduction and "big step with big stomps," primarily provided as feedback after several reps completed. Pt still most challenged with backwards step and reach.   Functional Component Tasks - performance and progress: Sit to Stand x 5 from standard chair 2. Walk and turn 5x  3. Bend and reach 5x 4. Open Jar 5x  - goal  5. Step up on step 5x Comments: Pt exhibits continued improvement in large amplitude/increased force with opening a  jar. Pt with some difficulty sustaining large amplitude turns today.   Hierarchy Tasks - performance and progress: Contact guard assist to  min assist provided throughout unless otherwise noted for all upright tasks Going for a walk outside (uneven, varied surfaces); Pt not yet appropriate for outside ambulation over uneven surface, so continued focus on ambulating over compliant surface as this triggers pts sx (bradykinesia, hypokinesia, shuffling), with addition of dual task and focus on big turns. Pt challenged with dual task.    2. Handwriting - maintaining big handwriting while simultaneously scaling back to be within the lines. Pt practices writing as if she were to fill out a check. Pt writes full name, date, the words "birthday gift," and the "$50.00"  Pt able to sustain appropriately sized handwriting with task.    3. Ascending/descending stairs; big walk up to stairs from other side of clinic with turning, pt then performs big reach to handrails, upright  posture and big ascend/descent down stairs while holding a conversation. PT providing close CGA. Pt demos improvement with turns with this task. She uses reciprocal pattern both ascending/descending steps with BUE support.     Gait - performance and progress: 3x148 laps around clinic/in hallway without AD but with close CGA. Continued TC provided to LUE to improve arm-swing, addition of dual task with last lap. Pt able to maintain armswing with dual task, but had trouble coming up with names of animals/people while ambulating. Pt then ambulates pushing transport chair from PT clinic to elevators. She is able to sustain increased BLE step-length with use of transport chair for UE support while performing dual task, with some improvement with dual task compared to ambulating hands-free. Pt required one seated rest break after second lap.   Carryover Assignment: Updated on this date - help make dinner big or set the table big!     Homework: Pt to perform all 7 LSVT BIG exercises (adapted and seated for 5 and 7 see above), 5 Functional Component Tasks and big walking.  Reinforced patient in safety with adapted and seated maximal daily exercises.  PT continues to reinforce the following safety instructions from prior sessions. PT instructed patient on use of upper extremity support with exercises.  Patient verbalized understanding. Updated and instructed pt in carryover task.     Pt educated throughout session about proper posture and technique with exercises. Improved exercise technique, movement at target joints, use of target muscles after min to mod verbal, visual, tactile cues.     PT Education - 08/04/21 1220     Education provided Yes    Education Details exercise technique, body mechanics    Person(s) Educated Patient    Methods Explanation;Demonstration;Tactile cues;Verbal cues;Handout    Comprehension Verbalized understanding;Returned demonstration;Verbal cues required;Tactile cues  required;Need further instruction              PT Short Term Goals - 07/28/21 1513       PT SHORT TERM GOAL #1   Title Pt will demonstrate independence with LSVT BIG HEP to improve strength/mobility for improved safety and ease with ADLs and to maintain/continue gains beyond LSVT program.    Baseline 9/7: to be initiated next session; 9/26: pt making gains, more indepndent with exercises, but still requires some cuing    Time 2    Period Weeks    Status On-going    Target Date 08/11/21               PT Long Term Goals - 07/28/21 1514       PT LONG TERM GOAL #1   Title Patient will increase FOTO score to equal to  or greater than 47  to demonstrate statistically significant improvement in mobility and quality of life.    Baseline 9/7: 34; 9/26: 48    Time 5    Period Weeks    Status Achieved      PT LONG TERM GOAL #2   Title Patient (> 20 years old) will complete five times sit to stand test in < 15 seconds indicating decreased fall risk.    Baseline 9/7: 18.5 sec; 9/26: 16.26 sec hands free    Time 5    Period Weeks    Status Partially Met    Target Date 08/13/21      PT LONG TERM GOAL #3   Title Patient will increase 10 meter walk test to >1.57ms with WFL B step-length for 100% of test to improve ease with gait and to override hypokentic and bradykinetic movement.    Baseline 9/7: 0.74 m/s, decreased B step length and rigidty of movement; 9/26 deferred    Time 5    Period Weeks    Status On-going    Target Date 08/13/21      PT LONG TERM GOAL #4   Title Patient will reduce timed up and go to <11 seconds to reduce fall risk and demonstrate improved transfer/gait ability and ability to perform turns safely    Baseline 9/7: 17 seconds 9/26: deferred    Time 5    Period Weeks    Status On-going      PT LONG TERM GOAL #5   Title Pt will demo or report ability to open standard jar using only 1-2 attempts in order to exhibit improved functional mobility for ADLs.     Baseline 9/7: Pt reports she is unable to open jars; 9/26: pt ableto open in 2 attempts    Time 5    Period Weeks    Status Achieved                   Plan - 08/04/21 1221     Clinical Impression Statement Pt continues to show improvements with endurance/activity tolerance performing increased number of reps of maximal daily exercises without requiring a rest break. Pt also shows improved balance with ambulating on compliant surface where she was able to progress to dual tasking without LOB. While pt shows progress, she continues to be challenged with sequencing of arm-swing with increased BLE step length and upright posture. The pt is better able to sustain increased BLE step length and upright posture when she has UE support (such as with pushing the transport chair). The pt will benefit from further skilled LSVT BIG PT treatments to improve gait, balance, endurance and strength to increase QOL and decrease fall risk.    Personal Factors and Comorbidities Age;Sex;Fitness;Time since onset of injury/illness/exacerbation;Comorbidity 1;Comorbidity 3+;Comorbidity 2    Comorbidities MCI, HTN, CA, cataracts, bipolar 1, GAD, insomnia, chronic LBP    Examination-Activity Limitations Bend;Sit;Squat;Stairs;Stand;Locomotion Level;Self Feeding;Bathing;Transfers;Lift;Bed Mobility    Examination-Participation Restrictions Cleaning;Community Activity;Shop;Yard Work;Meal Prep;Personal Finances;Driving    Stability/Clinical Decision Making Evolving/Moderate complexity    Rehab Potential Fair    Clinical Impairments Affecting Rehab Potential MCI, chronic back pain    PT Frequency 4x / week    PT Duration Other (comment)   5 weeks   PT Treatment/Interventions ADLs/Self Care Home Management;Therapeutic exercise;Patient/family education;Neuromuscular re-education;Therapeutic activities;Functional mobility training;Balance training;Gait training;Stair training;Taping;Energy conservation;Passive range of  motion;DME Instruction;Cognitive remediation    PT Next Visit Plan Initiate LSVT BIG Maximal daily exercises (see note for adaptions), functional  component tasks, hierarchy tasks, carryover tasks, big gait/walking, TUG, 5mt    PT Home Exercise Plan LSVT 7 adapted maximal daily exercises (exercies 5 and 7 performed with seated versions), 5 functional component tasks, hierarchy tasks, big walking, and carryover task (updated 08/04/2021)    Consulted and Agree with Plan of Care Patient             Patient will benefit from skilled therapeutic intervention in order to improve the following deficits and impairments:  Abnormal gait, Decreased activity tolerance, Decreased cognition, Decreased endurance, Decreased knowledge of use of DME, Decreased range of motion, Decreased strength, Hypomobility, Dizziness, Pain, Improper body mechanics, Impaired UE functional use, Decreased balance, Decreased coordination, Decreased mobility, Difficulty walking, Increased edema, Impaired flexibility, Increased muscle spasms, Postural dysfunction  Visit Diagnosis: Other abnormalities of gait and mobility  Other lack of coordination  Unsteadiness on feet  Muscle weakness (generalized)  Parkinson disease ([place]     Problem List Patient Active Problem List   Diagnosis Date Noted   Chronic bilateral low back pain 06/18/2021   Elevated blood pressure reading 05/23/2021   Osteopenia of neck of right femur 09/06/2020   Obesity 08/29/2020   Parkinson disease ([place] 02/22/2020   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   Bipolar disorder, in full remission, most recent episode depressed ([place] 11/08/2019   Avitaminosis D 08/24/2019   Insomnia due to mental condition 05/31/2019   Bipolar I disorder, most recent episode depressed ([place] 05/31/2019   Alcohol use disorder, moderate, in sustained remission ([place] 05/31/2019   Elevated tumor markers 05/27/2019   GAD (generalized anxiety disorder) 04/10/2019   MDD (major  depressive disorder), severe ([place] 01/31/2019   High serum carbohydrate antigen 19-9 (CA19-9) 12/01/2018   Adjustment disorder with anxious mood 11/24/2018   Essential hypertension 11/24/2018   B12 deficiency 10/17/2018   Incisional hernia, without obstruction or gangrene 09/12/2018   Genetic testing 04/07/2018   Malignant neoplasm of upper-outer quadrant of right breast in female, estrogen receptor positive ([place] 03/17/2018   Malignant neoplasm of upper-inner quadrant of right breast in female, estrogen receptor positive ([place] 03/17/2018   History of psychosis 02/07/2018   History of insomnia 02/07/2018    HZollie Pee PT 08/04/2021, 12:25 PM  [facility] [address] [address]Phone: [phone]  Fax:  [phone] Name: JDaje StarkMRN: [id number] of Birth: 1Feb 17, 1947

## 2021-08-05 ENCOUNTER — Ambulatory Visit: Payer: Medicare PPO

## 2021-08-05 ENCOUNTER — Ambulatory Visit: Payer: Medicare PPO | Admitting: Speech Pathology

## 2021-08-05 DIAGNOSIS — R471 Dysarthria and anarthria: Secondary | ICD-10-CM | POA: Diagnosis not present

## 2021-08-05 DIAGNOSIS — R278 Other lack of coordination: Secondary | ICD-10-CM | POA: Diagnosis not present

## 2021-08-05 DIAGNOSIS — R2681 Unsteadiness on feet: Secondary | ICD-10-CM

## 2021-08-05 DIAGNOSIS — G20 Parkinson's disease: Secondary | ICD-10-CM

## 2021-08-05 DIAGNOSIS — M6281 Muscle weakness (generalized): Secondary | ICD-10-CM

## 2021-08-05 DIAGNOSIS — R2689 Other abnormalities of gait and mobility: Secondary | ICD-10-CM

## 2021-08-05 DIAGNOSIS — M79605 Pain in left leg: Secondary | ICD-10-CM

## 2021-08-05 NOTE — Therapy (Signed)
[place] MAIN [facility] SERVICES [address] [place], [place], [address] Phone: [phone]   Fax:  [phone]  Speech Language Pathology Treatment  Patient Details  Name: Kendra [name] MRN: [id number] Date of Birth: 12-02-1945 Referring Provider (SLP): Jerrol Banana., MD   Encounter Date: 08/05/2021   End of Session - 08/05/21 1616     Visit Number 14    Number of Visits 17    Date for SLP Re-Evaluation 10/07/21    Authorization - Visit Number 4    Progress Note Due on Visit 10    SLP Start Time 1500    SLP Stop Time  1600    SLP Time Calculation (min) 60 min    Activity Tolerance Patient tolerated treatment well             Past Medical History:  Diagnosis Date   Anxiety    Breast cancer ([place]) 03/2018   right breast cancer at 11:00 and 1:00   Bursitis    bilateral hips and knees   Complication of anesthesia    Depression    Family history of breast cancer 03/24/2018   Family history of lung cancer 03/24/2018   History of alcohol dependence ([place]) 2007   no ETOH; resolved since 2007   History of hiatal hernia    History of psychosis 2014   due to sleep disturbance   Hypertension    Parkinson disease ([place])    Personal history of radiation therapy 06/2018-07/2018   right breast ca   PONV (postoperative nausea and vomiting)     Past Surgical History:  Procedure Laterality Date   BREAST BIOPSY Right 03/14/2018   11:00 DCIS and invasive ductal carcinoma   BREAST BIOPSY Right 03/14/2018   1:00 Invasive ductal carcinoma   BREAST LUMPECTOMY Right 04/27/2018   lumpectomy of 11 and 1:00 cancers, clear margins, negative LN   BREAST LUMPECTOMY WITH RADIOACTIVE SEED AND SENTINEL LYMPH NODE BIOPSY Right 04/27/2018   Procedure: RIGHT BREAST LUMPECTOMY WITH RADIOACTIVE SEED X 2 AND RIGHT SENTINEL LYMPH NODE BIOPSY;  Surgeon: [name], MD;  Location: [facility];  Service: General;  Laterality: Right;    CATARACT EXTRACTION W/PHACO Left 08/30/2019   Procedure: CATARACT EXTRACTION PHACO AND INTRAOCULAR LENS PLACEMENT ([place]) LEFT panoptix toric  01:20.5  12.7%  10.26;  Surgeon: Leandrew Koyanagi, MD;  Location: [facility];  Service: Ophthalmology;  Laterality: Left;   CATARACT EXTRACTION W/PHACO Right 09/20/2019   Procedure: CATARACT EXTRACTION PHACO AND INTRAOCULAR LENS PLACEMENT ([facility]) RIGHT 5.71  00:36.4  15.7%;  Surgeon: Leandrew Koyanagi, MD;  Location: [facility];  Service: Ophthalmology;  Laterality: Right;   LAPAROTOMY N/A 02/10/2018   Procedure: EXPLORATORY LAPAROTOMY;  Surgeon: Isabel Caprice, MD;  Location: [facility];  Service: Gynecology;  Laterality: N/A;   MASS EXCISION  01/2018   abdominal   OVARIAN CYST REMOVAL     SALPINGOOPHORECTOMY Bilateral 02/10/2018   Procedure: BILATERAL SALPINGO OOPHORECTOMY; PERITONEAL WASHINGS;  Surgeon: Isabel Caprice, MD;  Location: [facility];  Service: Gynecology;  Laterality: Bilateral;   TONSILLECTOMY     TONSILLECTOMY AND ADENOIDECTOMY  1953    There were no vitals filed for this visit.   Subjective Assessment - 08/05/21 1615     Subjective "I did extra laps and I was sore."    Currently in Pain? --   no pain at rest/while sitting  ADULT SLP TREATMENT - 08/05/21 1615       General Information   Behavior/Cognition Alert;Cooperative    HPI Patient is a 75 y.o. female with history of Parkinson's disease, anxiety, depression, psychosis, HTN. bipolar disorder, alcoholism, memory issues since 2005, hx head trauma. Referred for LSVT BIG and LOUD.      Cognitive-Linquistic Treatment   Treatment focused on Dysarthria;Patient/family/caregiver education    Skilled Treatment LSVT Daily tasks with rare min cues. Loud /a/ averaged 83 dB, duration 15.7 seconds, pitch avg 226 Hz.  Max F0 for pitch glides was 297 Hz (84 dB avg), min F0 for low glides was 174 Hz (85 dB avg). Functional phrases averaged 84 dB with  modified independence. Hierarchical loudness drills at multiparagraph/conversation level averaged 81 dB, with occasional min cues for loudness.         Assessment / Recommendations / Plan   Plan Continue with current plan of care      Progression Toward Goals   Progression toward goals Progressing toward goals              SLP Education - 08/05/21 1616     Education Details effort level    Person(s) Educated Patient    Methods Explanation    Comprehension Verbalized understanding              SLP Short Term Goals - 07/29/21 1555       SLP SHORT TERM GOAL #1   Title The patient will complete Daily Tasks (Maximum duration "ah", High/Lows, and Functional Phrases) at average loudness >/= 85 dB and with loud, good quality voice with min cues.    Time 10    Period --   sessions   Status Achieved      SLP SHORT TERM GOAL #2   Title The patient will complete Hierarchal Speech Loudness reading drills (words/phrases, sentences) at average >/= 75 dB and with loud, good quality voice with min cues.    Time 10    Period --   sessions   Status Achieved      SLP SHORT TERM GOAL #3   Title The patient will participate in 5-8 minutes conversation, maintaining average loudness of 75 dB and loud, good quality voice with min cues.    Time 10    Period --   sessions   Status Achieved              SLP Long Term Goals - 07/29/21 1556       SLP LONG TERM GOAL #1   Title The patient will complete Daily Tasks (Maximum duration "ah", High/Lows, and Functional Phrases) at average loudness >/= 85 dB and with loud, good quality voice    Time 4    Period Weeks   or 17 sessions, for all LTGs   Status On-going      SLP LONG TERM GOAL #2   Title The patient will complete Hierarchal Speech Loudness reading drills (words/phrases, sentences, and paragraph) at average >/= 75 dB and with loud, good quality voice.    Time 4    Period Weeks    Status On-going      SLP LONG TERM GOAL #3    Title The patient will participate in 15-20 minutes conversation, maintaining average loudness of >/= 75 dB and loud, good quality voice.    Time 4    Period Weeks    Status On-going      SLP LONG TERM GOAL #4   Title  Patient will report improved communication effectiveness as measured by Communicative Effectiveness Survey.    Baseline 19/32 at eval    Time 4    Period Weeks    Status On-going              Plan - 08/05/21 1616     Clinical Impression Statement Patient presents with mild hypokinetic dysarthria. Continues to make steady progress toward goals and is beginning to demonstrate carryover to conversation for short periods. Vocal quality and endurance improving in structured tasks, fewer cues necessary for carryover into conversation. I recommend skilled ST for LSVT LOUD 4x a week for 4 weeks in order to improve vocal quality, intelligibility, and communicative effectiveness.    Speech Therapy Frequency 4x / week    Duration 4 weeks    Treatment/Interventions Language facilitation;Environmental controls;Cueing hierarchy;SLP instruction and feedback;Compensatory techniques;Functional tasks;Patient/family education;Multimodal communcation approach   LSVT LOUD   Potential to Achieve Goals Good    Potential Considerations Ability to learn/carryover information    SLP Home Exercise Plan to be provided next session    Consulted and Agree with Plan of Care Patient;Family member/caregiver    Family Member Consulted spouse             Patient will benefit from skilled therapeutic intervention in order to improve the following deficits and impairments:   Dysarthria and anarthria  Parkinson disease ([place])    Problem List Patient Active Problem List   Diagnosis Date Noted   Chronic bilateral low back pain 06/18/2021   Elevated blood pressure reading 05/23/2021   Osteopenia of neck of right femur 09/06/2020   Obesity 08/29/2020   Parkinson disease ([place]) 02/22/2020    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   Bipolar disorder, in full remission, most recent episode depressed ([place]) 11/08/2019   Avitaminosis D 08/24/2019   Insomnia due to mental condition 05/31/2019   Bipolar I disorder, most recent episode depressed ([place]) 05/31/2019   Alcohol use disorder, moderate, in sustained remission ([place]) 05/31/2019   Elevated tumor markers 05/27/2019   GAD (generalized anxiety disorder) 04/10/2019   MDD (major depressive disorder), severe ([place]) 01/31/2019   High serum carbohydrate antigen 19-9 (CA19-9) 12/01/2018   Adjustment disorder with anxious mood 11/24/2018   Essential hypertension 11/24/2018   B12 deficiency 10/17/2018   Incisional hernia, without obstruction or gangrene 09/12/2018   Genetic testing 04/07/2018   Malignant neoplasm of upper-outer quadrant of right breast in female, estrogen receptor positive ([place]) 03/17/2018   Malignant neoplasm of upper-inner quadrant of right breast in female, estrogen receptor positive ([place]) 03/17/2018   History of psychosis 02/07/2018   History of insomnia 02/07/2018   Deneise Lever, [place], CCC-SLP Speech-Language Pathologist  Aliene Altes 08/05/2021, 4:17 PM  [place] MAIN [facility] SERVICES [address] [place], [place], [address] Phone: [phone]   Fax:  [phone]   Name: Kendra [name] MRN: [id number] Date of Birth: 1946/10/19

## 2021-08-05 NOTE — Therapy (Signed)
[place] MAIN [facility] SERVICES [address] [place], [place], [address] Phone: [phone]   Fax:  [phone]  Physical Therapy Treatment  Patient Details  Name: Kendra [name] MRN: [id number] Date of Birth: 02/16/1946 Referring Provider (PT): Arbutus Ped., MD   Encounter Date: 08/05/2021   PT End of Session - 08/05/21 1714     Visit Number 15    Number of Visits 17    Date for PT Re-Evaluation 08/13/21    Authorization Type LSVT BIG    Authorization Time Period Evaluation performed on 07/09/2021    PT Start Time 1607    PT Stop Time 1704    PT Time Calculation (min) 57 min    Equipment Utilized During Treatment Gait belt    Activity Tolerance Patient tolerated treatment well    Behavior During Therapy [facility] for tasks assessed/performed             Past Medical History:  Diagnosis Date   Anxiety    Breast cancer ([place]) 03/2018   right breast cancer at 11:00 and 1:00   Bursitis    bilateral hips and knees   Complication of anesthesia    Depression    Family history of breast cancer 03/24/2018   Family history of lung cancer 03/24/2018   History of alcohol dependence ([place]) 2007   no ETOH; resolved since 2007   History of hiatal hernia    History of psychosis 2014   due to sleep disturbance   Hypertension    Parkinson disease ([place])    Personal history of radiation therapy 06/2018-07/2018   right breast ca   PONV (postoperative nausea and vomiting)     Past Surgical History:  Procedure Laterality Date   BREAST BIOPSY Right 03/14/2018   11:00 DCIS and invasive ductal carcinoma   BREAST BIOPSY Right 03/14/2018   1:00 Invasive ductal carcinoma   BREAST LUMPECTOMY Right 04/27/2018   lumpectomy of 11 and 1:00 cancers, clear margins, negative LN   BREAST LUMPECTOMY WITH RADIOACTIVE SEED AND SENTINEL LYMPH NODE BIOPSY Right 04/27/2018   Procedure: RIGHT BREAST LUMPECTOMY WITH RADIOACTIVE SEED X 2 AND RIGHT SENTINEL LYMPH  NODE BIOPSY;  Surgeon: [name], MD;  Location: [place];  Service: General;  Laterality: Right;   CATARACT EXTRACTION W/PHACO Left 08/30/2019   Procedure: CATARACT EXTRACTION PHACO AND INTRAOCULAR LENS PLACEMENT ([place]) LEFT panoptix toric  01:20.5  12.7%  10.26;  Surgeon: Leandrew Koyanagi, MD;  Location: [place];  Service: Ophthalmology;  Laterality: Left;   CATARACT EXTRACTION W/PHACO Right 09/20/2019   Procedure: CATARACT EXTRACTION PHACO AND INTRAOCULAR LENS PLACEMENT ([facility]) RIGHT 5.71  00:36.4  15.7%;  Surgeon: Leandrew Koyanagi, MD;  Location: [place];  Service: Ophthalmology;  Laterality: Right;   LAPAROTOMY N/A 02/10/2018   Procedure: EXPLORATORY LAPAROTOMY;  Surgeon: Isabel Caprice, MD;  Location: [facility];  Service: Gynecology;  Laterality: N/A;   MASS EXCISION  01/2018   abdominal   OVARIAN CYST REMOVAL     SALPINGOOPHORECTOMY Bilateral 02/10/2018   Procedure: BILATERAL SALPINGO OOPHORECTOMY; PERITONEAL WASHINGS;  Surgeon: Isabel Caprice, MD;  Location: [facility];  Service: Gynecology;  Laterality: Bilateral;   TONSILLECTOMY     TONSILLECTOMY AND ADENOIDECTOMY  1953    There were no vitals filed for this visit.   Subjective Assessment - 08/05/21 1603     Subjective Pt reports she had increased pain after appointments yesterday evening in [facility] and with walking a lot at  home. She reports current LLE pain is 6/10.    Patient is accompained by: Family member   spouse, [name]   Pertinent History Pt presents to PT for LSVT BIG evaluation. Pt diagnosed with PD in 2019. Pt reports difficulty with gait speed, freezing, transfers, balance, cognition, and use of hands d/t tremors. Pt has previously attended PT for treatment of back pain. Per chart other PMH includes hx of breast cancer, HTN, MCI, insomnia, cataracts, bipolar 1, alcohol use disorder, GAD, MDD, osteopenia of neck of R femur, chronic B low back pain with radiating pain to buttock  and posterior thighs and calves    Limitations Sitting;Standing;Walking;Reading;Lifting;Writing;House hold activities    How long can you sit comfortably? limited if not well positioned. Feet must be propped up.    How long can you stand comfortably? not comfortable, pain is constant    How long can you walk comfortably? 5 min    Diagnostic tests No recent imaging pertinent to PD per chart. Other most recent imaging per Chancis, Juanda Crumble note 06/27/2021: 'MRI lumbar spine without contrast [facility] dated 01/31/2020 imaging and report reviewed today. L5-S1 central disc bulging with severe bilateral facet arthropathy no central or foraminal stenosis. L4-5 grade 1 anterolisthesis with severe bilateral facet arthropathy and ligamentum flavum hypertrophy. She has severe right and moderate to severe left foraminal stenosis. Moderate central stenosis. L3-4 broad-based disc bulging with severe bilateral facet arthropathy and ligamentum flavum hypertrophy. Severe central stenosis. Moderate severe subarticular stenosis. No foraminal stenosis. L2-3 diffuse disc bulging with severe bilateral facet arthropathy and ligamentum flavum hypertrophy. Severe central stenosis and severe bilateral subarticular recess stenosis. Moderate left and mild to moderate right foraminal stenosis. L1-2 broad-based disc bulging with mild bilateral facet arthropathy. No central or foraminal stenosis."    Patient Stated Goals Pt would like to be able to walk faster, get up from a chair more easily    Currently in Pain? Yes    Pain Score 6     Pain Location Leg    Pain Orientation Left    Pain Onset More than a month ago    Pain Onset More than a month ago             LSVT BIG Treatment:   Patient seen for LSVT Daily Session Maximal Daily Exercises for facilitation/coordination of movement Sustained movements are designed to rescale the amplitude of movement output for generalization to daily functional activities.    Adapted Maximal Daily Exercises: Floor to Ceiling 10 with 10 second hold;  Side to Side 10 Bilateral with 10 second hold  Forward Step and Reach 8 L, 15 R; pain limited on L Sideways Step and Reach 10 L, 15 R pain limited Backwards Step and Reach 15 Bilateral - modified to Seated Maximal Daily Exercise  Forwards Rock and Reach 15 Bilateral Sideways Rock and Reach 15 Bilateral - modified to Seated Maximal Daily exercise    Daily Exercise Comments: Pt performing exercises 5 and 7 seated. Pt continues to perform all maximal daily exercises without a rest break and with increased repetitions, except for exercises 3 and 4 which were limited today secondary to LLE pain. Otherwise pt pain level remained at baseline with majority of exercises and pt highly motivated to participate.    Functional Component Tasks - performance and progress: Sit to Stand x 5 from standard chair 2. Walk and turn 5x  3. Bend and reach 5x 4. Open Jar 5x  - goal  5. Step up  on step 5x Comments: Pt struggles somewhat with bradykinesia with turning today, but is intermittently able to correct with VC/modeling.    Hierarchy Tasks - performance and progress: Contact guard assist to min assist provided throughout unless otherwise noted for all upright tasks Going for a walk outside (uneven, varied surfaces); Pt not yet appropriate for outside ambulation over uneven surface, so continued focus on ambulating over compliant surface as this triggers pts sx (bradykinesia, hypokinesia, shuffling), with addition of dual task and focus on big turns.  Comments: pt with one instance of LOB where she utilized min a from PT and UE support on mat-table to regain balance. She continues to be challenged with dual task. Pt performance today likely impacted by LLE pain/fatigue.    2. Handwriting - maintaining big handwriting while simultaneously scaling back to be within the lines. Pt practices writing as if she were to fill out a check,  writing her signature, date, birthday gift, "cookie monster." Pt continues to sustain large-amplitude movement and demonstrates good fine motor control with writing.    3. Ascending/descending stairs; big walk up to stairs from other side of clinic with turning, pt then performs big reach to handrails, upright posture and big ascend/descent down stairs while holding a conversation. PT providing close CGA. Pt requires some minor cuing today for upright posture, has some difficulty maintaining conversation likely d/t fatigue.    Gait - performance and progress: 2x148 laps around clinic/in hallway without AD but with close CGA. Continued TC provided to LUE to improve arm-swing, addition of dual task with last lap. Pt requires increased TC compared to yesterday for armswing/sequencing and sustaining this with dual task. Gait overall limited today d/t LLE pain.   Pt then ambulates pushing transport chair from PT clinic to elevators. She exhibits improvement in sustaining big BLE step-length with use of transport chair for UE support while performing dual task. Pt still challenged with dual task.     Carryover Assignment: Updated on this date - when hanging out with cousin go out to dinner big, sustain big walking, big STS, big reaching, etc... maintain all movements as BIG!    Homework: Pt to perform all 7 LSVT BIG exercises (adapted and seated for 5 and 7 see above), 5 Functional Component Tasks and big walking.  Reinforced patient in safety with adapted and seated maximal daily exercises.  PT continues to reinforce the following safety instructions from prior sessions. PT instructed patient on use of upper extremity support with exercises.  Patient verbalized understanding. Updated and instructed pt in carryover task.     Pt educated throughout session about proper posture and technique with exercises. Improved exercise technique, movement at target joints, use of target muscles after min to mod verbal,  visual, tactile cues.      PT Education - 08/05/21 1713     Education provided Yes    Education Details exercise technique, body mechanics, updated carryover task    Person(s) Educated Patient    Methods Explanation;Demonstration;Tactile cues;Verbal cues;Handout    Comprehension Verbalized understanding;Returned demonstration;Tactile cues required;Need further instruction;Verbal cues required              PT Short Term Goals - 07/28/21 1513       PT SHORT TERM GOAL #1   Title Pt will demonstrate independence with LSVT BIG HEP to improve strength/mobility for improved safety and ease with ADLs and to maintain/continue gains beyond LSVT program.    Baseline 9/7: to be initiated next session; 9/26:  pt making gains, more indepndent with exercises, but still requires some cuing    Time 2    Period Weeks    Status On-going    Target Date 08/11/21               PT Long Term Goals - 07/28/21 1514       PT LONG TERM GOAL #1   Title Patient will increase FOTO score to equal to or greater than 47  to demonstrate statistically significant improvement in mobility and quality of life.    Baseline 9/7: 34; 9/26: 48    Time 5    Period Weeks    Status Achieved      PT LONG TERM GOAL #2   Title Patient (> 27 years old) will complete five times sit to stand test in < 15 seconds indicating decreased fall risk.    Baseline 9/7: 18.5 sec; 9/26: 16.26 sec hands free    Time 5    Period Weeks    Status Partially Met    Target Date 08/13/21      PT LONG TERM GOAL #3   Title Patient will increase 10 meter walk test to >1.37m/s with WFL B step-length for 100% of test to improve ease with gait and to override hypokentic and bradykinetic movement.    Baseline 9/7: 0.74 m/s, decreased B step length and rigidty of movement; 9/26 deferred    Time 5    Period Weeks    Status On-going    Target Date 08/13/21      PT LONG TERM GOAL #4   Title Patient will reduce timed up and go to <11  seconds to reduce fall risk and demonstrate improved transfer/gait ability and ability to perform turns safely    Baseline 9/7: 17 seconds 9/26: deferred    Time 5    Period Weeks    Status On-going      PT LONG TERM GOAL #5   Title Pt will demo or report ability to open standard jar using only 1-2 attempts in order to exhibit improved functional mobility for ADLs.    Baseline 9/7: Pt reports she is unable to open jars; 9/26: pt ableto open in 2 attempts    Time 5    Period Weeks    Status Achieved                   Plan - 08/05/21 1714     Clinical Impression Statement Pt does not ask for rest break d/t fatigue today indicating further improvements in activity tolerance. However, pt is limited with inteventions d/t LLE pain (chronic issue). She struggled more with sustaining large amplitude turns compared to prior sessions, and continues to be challenged with dual task. The pt will benefit from further skilled LSVT PT BIG treatments to improve gait, endurance, balance, strength and mobility to increase QOL and decrease fall risk.    Personal Factors and Comorbidities Age;Sex;Fitness;Time since onset of injury/illness/exacerbation;Comorbidity 1;Comorbidity 3+;Comorbidity 2    Comorbidities MCI, HTN, CA, cataracts, bipolar 1, GAD, insomnia, chronic LBP    Examination-Activity Limitations Bend;Sit;Squat;Stairs;Stand;Locomotion Level;Self Feeding;Bathing;Transfers;Lift;Bed Mobility    Examination-Participation Restrictions Cleaning;Community Activity;Shop;Yard Work;Meal Prep;Personal Finances;Driving    Stability/Clinical Decision Making Evolving/Moderate complexity    Rehab Potential Fair    Clinical Impairments Affecting Rehab Potential MCI, chronic back pain    PT Frequency 4x / week    PT Duration Other (comment)   5 weeks   PT Treatment/Interventions ADLs/Self Care Home Management;Therapeutic  exercise;Patient/family education;Neuromuscular re-education;Therapeutic  activities;Functional mobility training;Balance training;Gait training;Stair training;Taping;Energy conservation;Passive range of motion;DME Instruction;Cognitive remediation    PT Next Visit Plan Initiate LSVT BIG Maximal daily exercises (see note for adaptions), functional component tasks, hierarchy tasks, carryover tasks, big gait/walking, TUG, 71mt    PT Home Exercise Plan LSVT 7 adapted maximal daily exercises (exercies 5 and 7 performed with seated versions), 5 functional component tasks, hierarchy tasks, big walking, and carryover task (updated 08/05/2021)    Consulted and Agree with Plan of Care Patient             Patient will benefit from skilled therapeutic intervention in order to improve the following deficits and impairments:  Abnormal gait, Decreased activity tolerance, Decreased cognition, Decreased endurance, Decreased knowledge of use of DME, Decreased range of motion, Decreased strength, Hypomobility, Dizziness, Pain, Improper body mechanics, Impaired UE functional use, Decreased balance, Decreased coordination, Decreased mobility, Difficulty walking, Increased edema, Impaired flexibility, Increased muscle spasms, Postural dysfunction  Visit Diagnosis: Other abnormalities of gait and mobility  Other lack of coordination  Muscle weakness (generalized)  Unsteadiness on feet  Pain in left leg  Parkinson disease ([place]     Problem List Patient Active Problem List   Diagnosis Date Noted   Chronic bilateral low back pain 06/18/2021   Elevated blood pressure reading 05/23/2021   Osteopenia of neck of right femur 09/06/2020   Obesity 08/29/2020   Parkinson disease ([place] 02/22/2020   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   Bipolar disorder, in full remission, most recent episode depressed ([place] 11/08/2019   Avitaminosis D 08/24/2019   Insomnia due to mental condition 05/31/2019   Bipolar I disorder, most recent episode depressed ([place] 05/31/2019   Alcohol use disorder,  moderate, in sustained remission ([place] 05/31/2019   Elevated tumor markers 05/27/2019   GAD (generalized anxiety disorder) 04/10/2019   MDD (major depressive disorder), severe ([place] 01/31/2019   High serum carbohydrate antigen 19-9 (CA19-9) 12/01/2018   Adjustment disorder with anxious mood 11/24/2018   Essential hypertension 11/24/2018   B12 deficiency 10/17/2018   Incisional hernia, without obstruction or gangrene 09/12/2018   Genetic testing 04/07/2018   Malignant neoplasm of upper-outer quadrant of right breast in female, estrogen receptor positive ([place] 03/17/2018   Malignant neoplasm of upper-inner quadrant of right breast in female, estrogen receptor positive ([place] 03/17/2018   History of psychosis 02/07/2018   History of insomnia 02/07/2018    HZollie Pee PT 08/05/2021, 5:24 PM  [place] [address] [place] [address]Phone: [phone]  Fax:  [phone] Name: JLekeisha ArenasMRN: [id number] of Birth: 1Dec 07, 1947

## 2021-08-06 ENCOUNTER — Ambulatory Visit: Payer: Medicare PPO

## 2021-08-06 ENCOUNTER — Ambulatory Visit: Payer: Medicare PPO | Admitting: Speech Pathology

## 2021-08-06 ENCOUNTER — Other Ambulatory Visit: Payer: Self-pay

## 2021-08-06 DIAGNOSIS — R2681 Unsteadiness on feet: Secondary | ICD-10-CM | POA: Diagnosis not present

## 2021-08-06 DIAGNOSIS — R2689 Other abnormalities of gait and mobility: Secondary | ICD-10-CM

## 2021-08-06 DIAGNOSIS — R471 Dysarthria and anarthria: Secondary | ICD-10-CM

## 2021-08-06 DIAGNOSIS — G20 Parkinson's disease: Secondary | ICD-10-CM

## 2021-08-06 DIAGNOSIS — R278 Other lack of coordination: Secondary | ICD-10-CM

## 2021-08-06 DIAGNOSIS — M6281 Muscle weakness (generalized): Secondary | ICD-10-CM | POA: Diagnosis not present

## 2021-08-06 DIAGNOSIS — M79605 Pain in left leg: Secondary | ICD-10-CM

## 2021-08-06 NOTE — Therapy (Signed)
[place] MAIN [facility] SERVICES [address] [place], [place], [address] Phone: [phone]   Fax:  [phone]  Physical Therapy Treatment  Patient Details  Name: Kendra [name] MRN: [id number] Date of Birth: Oct 11, 1946 Referring Provider (PT): Arbutus Ped., MD   Encounter Date: 08/06/2021   PT End of Session - 08/06/21 1711     Visit Number 16    Number of Visits 17    Date for PT Re-Evaluation 08/13/21    Authorization Type LSVT BIG    Authorization Time Period Evaluation performed on 07/09/2021    PT Start Time 1101    PT Stop Time 1204    PT Time Calculation (min) 63 min    Equipment Utilized During Treatment Gait belt    Activity Tolerance Patient tolerated treatment well    Behavior During Therapy [facility] for tasks assessed/performed             Past Medical History:  Diagnosis Date   Anxiety    Breast cancer ([place]) 03/2018   right breast cancer at 11:00 and 1:00   Bursitis    bilateral hips and knees   Complication of anesthesia    Depression    Family history of breast cancer 03/24/2018   Family history of lung cancer 03/24/2018   History of alcohol dependence ([place]) 2007   no ETOH; resolved since 2007   History of hiatal hernia    History of psychosis 2014   due to sleep disturbance   Hypertension    Parkinson disease ([place])    Personal history of radiation therapy 06/2018-07/2018   right breast ca   PONV (postoperative nausea and vomiting)     Past Surgical History:  Procedure Laterality Date   BREAST BIOPSY Right 03/14/2018   11:00 DCIS and invasive ductal carcinoma   BREAST BIOPSY Right 03/14/2018   1:00 Invasive ductal carcinoma   BREAST LUMPECTOMY Right 04/27/2018   lumpectomy of 11 and 1:00 cancers, clear margins, negative LN   BREAST LUMPECTOMY WITH RADIOACTIVE SEED AND SENTINEL LYMPH NODE BIOPSY Right 04/27/2018   Procedure: RIGHT BREAST LUMPECTOMY WITH RADIOACTIVE SEED X 2 AND RIGHT SENTINEL LYMPH  NODE BIOPSY;  Surgeon: [name], MD;  Location: [place];  Service: General;  Laterality: Right;   CATARACT EXTRACTION W/PHACO Left 08/30/2019   Procedure: CATARACT EXTRACTION PHACO AND INTRAOCULAR LENS PLACEMENT ([place]) LEFT panoptix toric  01:20.5  12.7%  10.26;  Surgeon: Leandrew Koyanagi, MD;  Location: [place];  Service: Ophthalmology;  Laterality: Left;   CATARACT EXTRACTION W/PHACO Right 09/20/2019   Procedure: CATARACT EXTRACTION PHACO AND INTRAOCULAR LENS PLACEMENT ([facility]) RIGHT 5.71  00:36.4  15.7%;  Surgeon: Leandrew Koyanagi, MD;  Location: [place];  Service: Ophthalmology;  Laterality: Right;   LAPAROTOMY N/A 02/10/2018   Procedure: EXPLORATORY LAPAROTOMY;  Surgeon: Isabel Caprice, MD;  Location: [facility];  Service: Gynecology;  Laterality: N/A;   MASS EXCISION  01/2018   abdominal   OVARIAN CYST REMOVAL     SALPINGOOPHORECTOMY Bilateral 02/10/2018   Procedure: BILATERAL SALPINGO OOPHORECTOMY; PERITONEAL WASHINGS;  Surgeon: Isabel Caprice, MD;  Location: [facility];  Service: Gynecology;  Laterality: Bilateral;   TONSILLECTOMY     TONSILLECTOMY AND ADENOIDECTOMY  1953    There were no vitals filed for this visit.   Subjective Assessment - 08/06/21 1104     Subjective Pt reports 6/10 pain currently. Pt reports doing HEP and carryover task, keeping things BIG.  Patient is accompained by: Family member   spouse, [name]   Pertinent History Pt presents to PT for LSVT BIG evaluation. Pt diagnosed with PD in 2019. Pt reports difficulty with gait speed, freezing, transfers, balance, cognition, and use of hands d/t tremors. Pt has previously attended PT for treatment of back pain. Per chart other PMH includes hx of breast cancer, HTN, MCI, insomnia, cataracts, bipolar 1, alcohol use disorder, GAD, MDD, osteopenia of neck of R femur, chronic B low back pain with radiating pain to buttock and posterior thighs and calves    Limitations  Sitting;Standing;Walking;Reading;Lifting;Writing;House hold activities    How long can you sit comfortably? limited if not well positioned. Feet must be propped up.    How long can you stand comfortably? not comfortable, pain is constant    How long can you walk comfortably? 5 min    Diagnostic tests No recent imaging pertinent to PD per chart. Other most recent imaging per Chancis, Juanda Crumble note 06/27/2021: 'MRI lumbar spine without contrast [facility] dated 01/31/2020 imaging and report reviewed today. L5-S1 central disc bulging with severe bilateral facet arthropathy no central or foraminal stenosis. L4-5 grade 1 anterolisthesis with severe bilateral facet arthropathy and ligamentum flavum hypertrophy. She has severe right and moderate to severe left foraminal stenosis. Moderate central stenosis. L3-4 broad-based disc bulging with severe bilateral facet arthropathy and ligamentum flavum hypertrophy. Severe central stenosis. Moderate severe subarticular stenosis. No foraminal stenosis. L2-3 diffuse disc bulging with severe bilateral facet arthropathy and ligamentum flavum hypertrophy. Severe central stenosis and severe bilateral subarticular recess stenosis. Moderate left and mild to moderate right foraminal stenosis. L1-2 broad-based disc bulging with mild bilateral facet arthropathy. No central or foraminal stenosis."    Patient Stated Goals Pt would like to be able to walk faster, get up from a chair more easily    Currently in Pain? Yes    Pain Score 6     Pain Location Leg    Pain Orientation Left    Pain Onset More than a month ago    Pain Onset More than a month ago              INTERVENTIONS    LSVT BIG Treatment:   Patient seen for LSVT Daily Session Maximal Daily Exercises for facilitation/coordination of movement Sustained movements are designed to rescale the amplitude of movement output for generalization to daily functional activities.   Adapted Maximal Daily  Exercises: Floor to Ceiling 10 with 10 second hold;  Side to Side 10 Bilateral with 10 second hold  Forward Step and Reach 15 Bilateral Sideways Step and Reach 15 Bilateral Backwards Step and Reach 15 Bilateral - modified to Seated Maximal Daily Exercise  Forwards Rock and Reach 15 Bilateral Sideways Rock and Reach 15 Bilateral - modified to Seated Maximal Daily exercise    Daily Exercise Comments: Pt performing exercises 5 and 7 seated. Pt has now progressed all maximal daily exercises in number of reps performed, indicating improved stamina. Pt able to "teach back" the majority of maximal daily exercises requiring anywhere from 30-40% cuing for majority. Pt still struggles most with backwards step and reach requiring >80% modeling/VC.    Functional Component Tasks - performance and progress: Sit to Stand x 5 from standard chair 2. Walk and turn 5x  3. Bend and reach 5x 4. Open Jar 5x  - goal  5. Step up on step 5x Comments: Pt with improved walking with turning today, where 3/5 reps were performed with  large amplitude. Pt overall independent with functional component tasks.    Hierarchy Tasks - performance and progress: Contact guard assist provided throughout unless otherwise noted for all upright tasks Going for a walk outside (uneven, varied surfaces); Pt not yet appropriate for outside ambulation over uneven surface, so continued focus on ambulating over compliant surface as this triggers pts sx (bradykinesia, hypokinesia, shuffling), with addition of dual task and focus on big turns.  Comments: pt with no LOB this session and able to perform without rest break. She intermittently is able to perform dual task with continuing gait on compliant surface.    2. Handwriting - maintaining big handwriting while simultaneously scaling back to be within the lines. Pt practices writing as if she were to fill out a check, writing her signature, date, birthday gift, "cookie monster." Pt independent  with task.   3. Ascending/descending stairs; big walk up to stairs from other side of clinic with turning, pt then performs big reach to handrails, upright posture and big ascend/descent down stairs while holding a conversation. Pt requires minimal cuing to perform task.    Gait - performance and progress: 3x148 laps around clinic/in hallway where laps 2,3 were performed with 4WW, lap one with no AD, with close CGA for all. Continued TC provided to LUE to improve arm-swing during initial lap. Pt performs dual cog task. Overall, pt with improved ability to perform dual cog task with use of AD and also improvement with sustaining B step-length. PT also teaches pt on safe and correct use of 4WW. Pt demos ability to use 4WW correctly.   -Pt then ambulates pushing transport chair from PT clinic to elevators with CGA.     Carryover Assignment: Updated on this date - greet husband robert wtih big wave, walk big when out and about, move big the whole day    Homework: Pt to perform all 7 LSVT BIG exercises (adapted and seated for 5 and 7 see above), 5 Functional Component Tasks and big walking.  Reinforced patient in safety with adapted and seated maximal daily exercises.  PT continues to reinforce the following safety instructions from prior sessions. PT instructed patient on use of upper extremity support with exercises.  Patient verbalized understanding. Updated and instructed pt in carryover task.     Pt educated throughout session about proper posture and technique with exercises. Improved exercise technique, movement at target joints, use of target muscles after min to mod verbal, visual, tactile cues.      PT Education - 08/06/21 1710     Education provided Yes    Education Details exercise technique, body mechanics, updated carryover task    Person(s) Educated Patient    Methods Explanation;Demonstration;Tactile cues;Verbal cues;Handout    Comprehension Verbalized understanding;Returned  demonstration;Verbal cues required;Tactile cues required;Need further instruction              PT Short Term Goals - 07/28/21 1513       PT SHORT TERM GOAL #1   Title Pt will demonstrate independence with LSVT BIG HEP to improve strength/mobility for improved safety and ease with ADLs and to maintain/continue gains beyond LSVT program.    Baseline 9/7: to be initiated next session; 9/26: pt making gains, more indepndent with exercises, but still requires some cuing    Time 2    Period Weeks    Status On-going    Target Date 08/11/21               PT Long Term  Goals - 07/28/21 1514       PT LONG TERM GOAL #1   Title Patient will increase FOTO score to equal to or greater than 47  to demonstrate statistically significant improvement in mobility and quality of life.    Baseline 9/7: 34; 9/26: 48    Time 5    Period Weeks    Status Achieved      PT LONG TERM GOAL #2   Title Patient (> 21 years old) will complete five times sit to stand test in < 15 seconds indicating decreased fall risk.    Baseline 9/7: 18.5 sec; 9/26: 16.26 sec hands free    Time 5    Period Weeks    Status Partially Met    Target Date 08/13/21      PT LONG TERM GOAL #3   Title Patient will increase 10 meter walk test to >1.56ms with WFL B step-length for 100% of test to improve ease with gait and to override hypokentic and bradykinetic movement.    Baseline 9/7: 0.74 m/s, decreased B step length and rigidty of movement; 9/26 deferred    Time 5    Period Weeks    Status On-going    Target Date 08/13/21      PT LONG TERM GOAL #4   Title Patient will reduce timed up and go to <11 seconds to reduce fall risk and demonstrate improved transfer/gait ability and ability to perform turns safely    Baseline 9/7: 17 seconds 9/26: deferred    Time 5    Period Weeks    Status On-going      PT LONG TERM GOAL #5   Title Pt will demo or report ability to open standard jar using only 1-2 attempts in order to  exhibit improved functional mobility for ADLs.    Baseline 9/7: Pt reports she is unable to open jars; 9/26: pt ableto open in 2 attempts    Time 5    Period Weeks    Status Achieved                   Plan - 08/06/21 1718     Clinical Impression Statement Pt has now progressed number of reps performed with all maximal daily exercises, indicating improved endurance/activity tolerance. Pt overall requires 30-40% cuing with majority of interventions. However, pt remains challenged with backwards step and reach requiring >80% cuing, with difficulty with sequencing/coordination. Compared to initial sessions pt able to perform interventions with improved amplitude and speed, but still struggles to increase speed/amplitude further. Pt goals to be reassessed tomorrow for final visit. The pt will benefit from further skilled LSVT BIG treatments to continue to improve gait, mobility, strength, endurance and balance in order to decrease fall risk and increase QOL.    Personal Factors and Comorbidities Age;Sex;Fitness;Time since onset of injury/illness/exacerbation;Comorbidity 1;Comorbidity 3+;Comorbidity 2    Comorbidities MCI, HTN, CA, cataracts, bipolar 1, GAD, insomnia, chronic LBP    Examination-Activity Limitations Bend;Sit;Squat;Stairs;Stand;Locomotion Level;Self Feeding;Bathing;Transfers;Lift;Bed Mobility    Examination-Participation Restrictions Cleaning;Community Activity;Shop;Yard Work;Meal Prep;Personal Finances;Driving    Stability/Clinical Decision Making Evolving/Moderate complexity    Rehab Potential Fair    Clinical Impairments Affecting Rehab Potential MCI, chronic back pain    PT Frequency 4x / week    PT Duration Other (comment)   5 weeks   PT Treatment/Interventions ADLs/Self Care Home Management;Therapeutic exercise;Patient/family education;Neuromuscular re-education;Therapeutic activities;Functional mobility training;Balance training;Gait training;Stair training;Taping;Energy  conservation;Passive range of motion;DME Instruction;Cognitive remediation    PT Next Visit  Plan Initiate LSVT BIG Maximal daily exercises (see note for adaptions), functional component tasks, hierarchy tasks, carryover tasks, big gait/walking, TUG, 60mt    PT Home Exercise Plan LSVT 7 adapted maximal daily exercises (exercies 5 and 7 performed with seated versions), 5 functional component tasks, hierarchy tasks, big walking, and carryover task (updated 08/06/2021)    Consulted and Agree with Plan of Care Patient             Patient will benefit from skilled therapeutic intervention in order to improve the following deficits and impairments:  Abnormal gait, Decreased activity tolerance, Decreased cognition, Decreased endurance, Decreased knowledge of use of DME, Decreased range of motion, Decreased strength, Hypomobility, Dizziness, Pain, Improper body mechanics, Impaired UE functional use, Decreased balance, Decreased coordination, Decreased mobility, Difficulty walking, Increased edema, Impaired flexibility, Increased muscle spasms, Postural dysfunction  Visit Diagnosis: Other abnormalities of gait and mobility  Other lack of coordination  Muscle weakness (generalized)  Unsteadiness on feet  Pain in left leg  Parkinson disease ([place]     Problem List Patient Active Problem List   Diagnosis Date Noted   Chronic bilateral low back pain 06/18/2021   Elevated blood pressure reading 05/23/2021   Osteopenia of neck of right femur 09/06/2020   Obesity 08/29/2020   Parkinson disease ([place] 02/22/2020   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   Bipolar disorder, in full remission, most recent episode depressed ([place] 11/08/2019   Avitaminosis D 08/24/2019   Insomnia due to mental condition 05/31/2019   Bipolar I disorder, most recent episode depressed ([place] 05/31/2019   Alcohol use disorder, moderate, in sustained remission ([place] 05/31/2019   Elevated tumor markers 05/27/2019   GAD  (generalized anxiety disorder) 04/10/2019   MDD (major depressive disorder), severe ([place] 01/31/2019   High serum carbohydrate antigen 19-9 (CA19-9) 12/01/2018   Adjustment disorder with anxious mood 11/24/2018   Essential hypertension 11/24/2018   B12 deficiency 10/17/2018   Incisional hernia, without obstruction or gangrene 09/12/2018   Genetic testing 04/07/2018   Malignant neoplasm of upper-outer quadrant of right breast in female, estrogen receptor positive ([place] 03/17/2018   Malignant neoplasm of upper-inner quadrant of right breast in female, estrogen receptor positive ([place] 03/17/2018   History of psychosis 02/07/2018   History of insomnia 02/07/2018    HZollie Pee PT 08/06/2021, 5:24 PM  [place] [facility] [address]RE[address] [place] [address]Phone: [phone]  Fax:  [phone] Name: Kendra LatellaMRN: [id number] of Birth: [birth year]

## 2021-08-06 NOTE — Therapy (Signed)
[place] MAIN [facility] SERVICES [address] [place], [place], [address] Phone: [phone]   Fax:  [phone]  Speech Language Pathology Treatment  Patient Details  Name: Kendra [name] MRN: [id number] Date of Birth: 08-10-1946 Referring Provider (SLP): Jerrol Banana., MD   Encounter Date: 08/06/2021   End of Session - 08/06/21 1744     Visit Number 15    Number of Visits 17    Date for SLP Re-Evaluation 10/07/21    Authorization - Visit Number 5    Progress Note Due on Visit 10    SLP Start Time 1000    SLP Stop Time  1100    SLP Time Calculation (min) 60 min    Activity Tolerance Patient tolerated treatment well             Past Medical History:  Diagnosis Date   Anxiety    Breast cancer ([place]) 03/2018   right breast cancer at 11:00 and 1:00   Bursitis    bilateral hips and knees   Complication of anesthesia    Depression    Family history of breast cancer 03/24/2018   Family history of lung cancer 03/24/2018   History of alcohol dependence ([place]) 2007   no ETOH; resolved since 2007   History of hiatal hernia    History of psychosis 2014   due to sleep disturbance   Hypertension    Parkinson disease ([place])    Personal history of radiation therapy 06/2018-07/2018   right breast ca   PONV (postoperative nausea and vomiting)     Past Surgical History:  Procedure Laterality Date   BREAST BIOPSY Right 03/14/2018   11:00 DCIS and invasive ductal carcinoma   BREAST BIOPSY Right 03/14/2018   1:00 Invasive ductal carcinoma   BREAST LUMPECTOMY Right 04/27/2018   lumpectomy of 11 and 1:00 cancers, clear margins, negative LN   BREAST LUMPECTOMY WITH RADIOACTIVE SEED AND SENTINEL LYMPH NODE BIOPSY Right 04/27/2018   Procedure: RIGHT BREAST LUMPECTOMY WITH RADIOACTIVE SEED X 2 AND RIGHT SENTINEL LYMPH NODE BIOPSY;  Surgeon: [name], MD;  Location: [place];  Service: General;  Laterality: Right;    CATARACT EXTRACTION W/PHACO Left 08/30/2019   Procedure: CATARACT EXTRACTION PHACO AND INTRAOCULAR LENS PLACEMENT ([place]) LEFT panoptix toric  01:20.5  12.7%  10.26;  Surgeon: Leandrew Koyanagi, MD;  Location: [place];  Service: Ophthalmology;  Laterality: Left;   CATARACT EXTRACTION W/PHACO Right 09/20/2019   Procedure: CATARACT EXTRACTION PHACO AND INTRAOCULAR LENS PLACEMENT ([facility]) RIGHT 5.71  00:36.4  15.7%;  Surgeon: Leandrew Koyanagi, MD;  Location: [place];  Service: Ophthalmology;  Laterality: Right;   LAPAROTOMY N/A 02/10/2018   Procedure: EXPLORATORY LAPAROTOMY;  Surgeon: Isabel Caprice, MD;  [facility];  Service: Gynecology;  Laterality: N/A;   MASS EXCISION  01/2018   abdominal   OVARIAN CYST REMOVAL     SALPINGOOPHORECTOMY Bilateral 02/10/2018   Procedure: BILATERAL SALPINGO OOPHORECTOMY; PERITONEAL WASHINGS;  Surgeon: Isabel Caprice, MD;  Location[facility];  Service: Gynecology;  Laterality: Bilateral;   TONSILLECTOMY     TONSILLECTOMY AND ADENOIDECTOMY  1953    There were no vitals filed for this visit.   Subjective Assessment - 08/06/21 1742     Subjective "I'm using an 8"    Currently in Pain? No/denies   no pain at rest                  ADULT SLP  TREATMENT - 08/06/21 1743       General Information   Behavior/Cognition Alert;Cooperative    HPI Patient is a 75 y.o. female with history of Parkinson's disease, anxiety, depression, psychosis, HTN. bipolar disorder, alcoholism, memory issues since 2005, hx head trauma. Referred for LSVT BIG and LOUD.      Cognitive-Linquistic Treatment   Treatment focused on Dysarthria;Patient/family/caregiver education    Skilled Treatment LSVT Daily tasks with modified independence. Loud /a/ averaged 83 dB, duration 14.6 seconds, pitch avg 230 Hz.  Max F0 for pitch glides was 302 Hz (85dB avg), min F0 for low glides was 177 Hz (84 dB avg). Functional phrases averaged 84 dB with modified  independence. Hierarchical loudness drills at conversation level averaged 78 dB, with rare min cues for loudness.        Assessment / Recommendations / Plan   Plan Continue with current plan of care      Progression Toward Goals   Progression toward goals Progressing toward goals              SLP Education - 08/06/21 1743     Education Details Pt will need to continue using same effort level for all conversations after ST    Person(s) Educated Patient    Methods Explanation    Comprehension Verbalized understanding              SLP Short Term Goals - 07/29/21 1555       SLP SHORT TERM GOAL #1   Title The patient will complete Daily Tasks (Maximum duration "ah", High/Lows, and Functional Phrases) at average loudness >/= 85 dB and with loud, good quality voice with min cues.    Time 10    Period --   sessions   Status Achieved      SLP SHORT TERM GOAL #2   Title The patient will complete Hierarchal Speech Loudness reading drills (words/phrases, sentences) at average >/= 75 dB and with loud, good quality voice with min cues.    Time 10    Period --   sessions   Status Achieved      SLP SHORT TERM GOAL #3   Title The patient will participate in 5-8 minutes conversation, maintaining average loudness of 75 dB and loud, good quality voice with min cues.    Time 10    Period --   sessions   Status Achieved              SLP Long Term Goals - 07/29/21 1556       SLP LONG TERM GOAL #1   Title The patient will complete Daily Tasks (Maximum duration "ah", High/Lows, and Functional Phrases) at average loudness >/= 85 dB and with loud, good quality voice    Time 4    Period Weeks   or 17 sessions, for all LTGs   Status On-going      SLP LONG TERM GOAL #2   Title The patient will complete Hierarchal Speech Loudness reading drills (words/phrases, sentences, and paragraph) at average >/= 75 dB and with loud, good quality voice.    Time 4    Period Weeks    Status  On-going      SLP LONG TERM GOAL #3   Title The patient will participate in 15-20 minutes conversation, maintaining average loudness of >/= 75 dB and loud, good quality voice.    Time 4    Period Weeks    Status On-going      SLP  LONG TERM GOAL #4   Title Patient will report improved communication effectiveness as measured by Communicative Effectiveness Survey.    Baseline 19/32 at eval    Time 4    Period Weeks    Status On-going              Plan - 08/06/21 1744     Clinical Impression Statement Patient presents with mild hypokinetic dysarthria. Continues to make steady progress toward goals and is beginning to demonstrate carryover to conversation for short periods. Maintained average of 78 dB in conversation today with min cues. I recommend skilled ST for LSVT LOUD 4x a week for 4 weeks in order to improve vocal quality, intelligibility, and communicative effectiveness.    Speech Therapy Frequency 4x / week    Duration 4 weeks    Treatment/Interventions Language facilitation;Environmental controls;Cueing hierarchy;SLP instruction and feedback;Compensatory techniques;Functional tasks;Patient/family education;Multimodal communcation approach   LSVT LOUD   Potential to Achieve Goals Good    Potential Considerations Ability to learn/carryover information    SLP Home Exercise Plan to be provided next session    Consulted and Agree with Plan of Care Patient;Family member/caregiver    Family Member Consulted spouse             Patient will benefit from skilled therapeutic intervention in order to improve the following deficits and impairments:   Dysarthria and anarthria  Parkinson disease ([place])    Problem List Patient Active Problem List   Diagnosis Date Noted   Chronic bilateral low back pain 06/18/2021   Elevated blood pressure reading 05/23/2021   Osteopenia of neck of right femur 09/06/2020   Obesity 08/29/2020   Parkinson disease ([place]) 02/22/2020   Mucinous  cystadenoma 12/24/2019   Bipolar disorder, in full remission, most recent episode depressed ([place]) 11/08/2019   Avitaminosis D 08/24/2019   Insomnia due to mental condition 05/31/2019   Bipolar I disorder, most recent episode depressed ([place]) 05/31/2019   Alcohol use disorder, moderate, in sustained remission ([place]) 05/31/2019   Elevated tumor markers 05/27/2019   GAD (generalized anxiety disorder) 04/10/2019   MDD (major depressive disorder), severe ([place]) 01/31/2019   High serum carbohydrate antigen 19-9 (CA19-9) 12/01/2018   Adjustment disorder with anxious mood 11/24/2018   Essential hypertension 11/24/2018   B12 deficiency 10/17/2018   Incisional hernia, without obstruction or gangrene 09/12/2018   Genetic testing 04/07/2018   Malignant neoplasm of upper-outer quadrant of right breast in female, estrogen receptor positive ([place]) 03/17/2018   Malignant neoplasm of upper-inner quadrant of right breast in female, estrogen receptor positive ([place]) 03/17/2018   History of psychosis 02/07/2018   History of insomnia 02/07/2018   Deneise Lever, [place], CCC-SLP Speech-Language Pathologist   Aliene Altes 08/06/2021, 5:45 PM  [place] [address] [place], [place], [address] Phone: [phone]   Fax:  [phone]   Name: Denissa Cozart MRN: [id number] Date of Birth: 1946-04-06

## 2021-08-07 ENCOUNTER — Ambulatory Visit: Payer: Medicare PPO

## 2021-08-07 ENCOUNTER — Ambulatory Visit: Payer: Medicare PPO | Admitting: Speech Pathology

## 2021-08-07 DIAGNOSIS — R2681 Unsteadiness on feet: Secondary | ICD-10-CM

## 2021-08-07 DIAGNOSIS — M79605 Pain in left leg: Secondary | ICD-10-CM

## 2021-08-07 DIAGNOSIS — R278 Other lack of coordination: Secondary | ICD-10-CM | POA: Diagnosis not present

## 2021-08-07 DIAGNOSIS — G20 Parkinson's disease: Secondary | ICD-10-CM | POA: Diagnosis not present

## 2021-08-07 DIAGNOSIS — R471 Dysarthria and anarthria: Secondary | ICD-10-CM

## 2021-08-07 DIAGNOSIS — M6281 Muscle weakness (generalized): Secondary | ICD-10-CM

## 2021-08-07 DIAGNOSIS — R2689 Other abnormalities of gait and mobility: Secondary | ICD-10-CM

## 2021-08-07 NOTE — Therapy (Signed)
[place] MAIN [facility] SERVICES [address] [place], [place], [address] Phone: [phone]   Fax:  [phone]  Speech Language Pathology Treatment  Patient Details  Name: Kendra [name] MRN: [id number] Date of Birth: 12-Aug-1946 Referring Provider (SLP): Jerrol Banana., MD   Encounter Date: 08/07/2021   End of Session - 08/07/21 1755     Visit Number 16    Number of Visits 17    Date for SLP Re-Evaluation 10/07/21    Authorization - Visit Number 6    Progress Note Due on Visit 10    SLP Start Time 1100    SLP Stop Time  1200    SLP Time Calculation (min) 60 min    Activity Tolerance Patient tolerated treatment well             Past Medical History:  Diagnosis Date   Anxiety    Breast cancer ([place]) 03/2018   right breast cancer at 11:00 and 1:00   Bursitis    bilateral hips and knees   Complication of anesthesia    Depression    Family history of breast cancer 03/24/2018   Family history of lung cancer 03/24/2018   History of alcohol dependence ([place]) 2007   no ETOH; resolved since 2007   History of hiatal hernia    History of psychosis 2014   due to sleep disturbance   Hypertension    Parkinson disease ([place])    Personal history of radiation therapy 06/2018-07/2018   right breast ca   PONV (postoperative nausea and vomiting)     Past Surgical History:  Procedure Laterality Date   BREAST BIOPSY Right 03/14/2018   11:00 DCIS and invasive ductal carcinoma   BREAST BIOPSY Right 03/14/2018   1:00 Invasive ductal carcinoma   BREAST LUMPECTOMY Right 04/27/2018   lumpectomy of 11 and 1:00 cancers, clear margins, negative LN   BREAST LUMPECTOMY WITH RADIOACTIVE SEED AND SENTINEL LYMPH NODE BIOPSY Right 04/27/2018   Procedure: RIGHT BREAST LUMPECTOMY WITH RADIOACTIVE SEED X 2 AND RIGHT SENTINEL LYMPH NODE BIOPSY;  Surgeon: [name], MD;  Location: [place];  Service: General;  Laterality: Right;    CATARACT EXTRACTION W/PHACO Left 08/30/2019   Procedure: CATARACT EXTRACTION PHACO AND INTRAOCULAR LENS PLACEMENT ([place]) LEFT panoptix toric  01:20.5  12.7%  10.26;  Surgeon: Leandrew Koyanagi, MD;  Location: [place];  Service: Ophthalmology;  Laterality: Left;   CATARACT EXTRACTION W/PHACO Right 09/20/2019   Procedure: CATARACT EXTRACTION PHACO AND INTRAOCULAR LENS PLACEMENT ([facility]) RIGHT 5.71  00:36.4  15.7%;  Surgeon: Leandrew Koyanagi, MD;  Location: [place];  Service: Ophthalmology;  Laterality: Right;   LAPAROTOMY N/A 02/10/2018   Procedure: EXPLORATORY LAPAROTOMY;  Surgeon: Isabel Caprice, MD;  [facility];  Service: Gynecology;  Laterality: N/A;   MASS EXCISION  01/2018   abdominal   OVARIAN CYST REMOVAL     SALPINGOOPHORECTOMY Bilateral 02/10/2018   Procedure: BILATERAL SALPINGO OOPHORECTOMY; PERITONEAL WASHINGS;  Surgeon: Isabel Caprice, MD;  [facility];  Service: Gynecology;  Laterality: Bilateral;   TONSILLECTOMY     TONSILLECTOMY AND ADENOIDECTOMY  1953    There were no vitals filed for this visit.   Subjective Assessment - 08/07/21 1754     Subjective Initial conversation with SLP WNL    Patient is accompained by: Family member   Spouse, Kendra [name]   Currently in Pain? No/denies  ADULT SLP TREATMENT - 08/07/21 1754       General Information   Behavior/Cognition Alert;Cooperative    HPI Patient is a 75 y.o. female with history of Parkinson's disease, anxiety, depression, psychosis, HTN. bipolar disorder, alcoholism, memory issues since 2005, hx head trauma. Referred for LSVT BIG and LOUD.      Cognitive-Linquistic Treatment   Treatment focused on Dysarthria;Patient/family/caregiver education    Skilled Treatment LSVT Daily tasks with min cues (breath support, consistent effort). Loud /a/ averaged 83 dB, duration 13.5 seconds, pitch avg 235 Hz.  Max F0 for pitch glides was 299 Hz (84dB avg), min F0 for low  glides was 176 Hz (84 dB avg). Functional phrases averaged 83 dB with modified independence. Hierarchical loudness drills at conversation level averaged 78 dB, with rare min cues for loudness.        Assessment / Recommendations / Plan   Plan Continue with current plan of care      Progression Toward Goals   Progression toward goals Progressing toward goals              SLP Education - 08/07/21 1754     Education Details loud conversation at home: discuss morning readings    Person(s) Educated Patient    Methods Explanation    Comprehension Verbalized understanding              SLP Short Term Goals - 07/29/21 1555       SLP SHORT TERM GOAL #1   Title The patient will complete Daily Tasks (Maximum duration "ah", High/Lows, and Functional Phrases) at average loudness >/= 85 dB and with loud, good quality voice with min cues.    Time 10    Period --   sessions   Status Achieved      SLP SHORT TERM GOAL #2   Title The patient will complete Hierarchal Speech Loudness reading drills (words/phrases, sentences) at average >/= 75 dB and with loud, good quality voice with min cues.    Time 10    Period --   sessions   Status Achieved      SLP SHORT TERM GOAL #3   Title The patient will participate in 5-8 minutes conversation, maintaining average loudness of 75 dB and loud, good quality voice with min cues.    Time 10    Period --   sessions   Status Achieved              SLP Long Term Goals - 07/29/21 1556       SLP LONG TERM GOAL #1   Title The patient will complete Daily Tasks (Maximum duration "ah", High/Lows, and Functional Phrases) at average loudness >/= 85 dB and with loud, good quality voice    Time 4    Period Weeks   or 17 sessions, for all LTGs   Status On-going      SLP LONG TERM GOAL #2   Title The patient will complete Hierarchal Speech Loudness reading drills (words/phrases, sentences, and paragraph) at average >/= 75 dB and with loud, good  quality voice.    Time 4    Period Weeks    Status On-going      SLP LONG TERM GOAL #3   Title The patient will participate in 15-20 minutes conversation, maintaining average loudness of >/= 75 dB and loud, good quality voice.    Time 4    Period Weeks    Status On-going      SLP LONG  TERM GOAL #4   Title Patient will report improved communication effectiveness as measured by Communicative Effectiveness Survey.    Baseline 19/32 at eval    Time 4    Period Weeks    Status On-going              Plan - 08/07/21 1755     Clinical Impression Statement Patient presents with mild hypokinetic dysarthria. Continues to make steady progress toward goals and is beginning to demonstrate carryover to conversation for short periods. Maintained average of 78 dB in conversation today with rare min cues. I recommend skilled ST for LSVT LOUD 4x a week for 4 weeks in order to improve vocal quality, intelligibility, and communicative effectiveness.    Speech Therapy Frequency 4x / week    Duration 4 weeks    Treatment/Interventions Language facilitation;Environmental controls;Cueing hierarchy;SLP instruction and feedback;Compensatory techniques;Functional tasks;Patient/family education;Multimodal communcation approach   LSVT LOUD   Potential to Achieve Goals Good    Potential Considerations Ability to learn/carryover information    SLP Home Exercise Plan to be provided next session    Consulted and Agree with Plan of Care Patient;Family member/caregiver    Family Member Consulted spouse             Patient will benefit from skilled therapeutic intervention in order to improve the following deficits and impairments:   Dysarthria and anarthria  Parkinson disease ([place])    Problem List Patient Active Problem List   Diagnosis Date Noted   Chronic bilateral low back pain 06/18/2021   Elevated blood pressure reading 05/23/2021   Osteopenia of neck of right femur 09/06/2020   Obesity  08/29/2020   Parkinson disease ([place]) 02/22/2020   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   Bipolar disorder, in full remission, most recent episode depressed ([place]) 11/08/2019   Avitaminosis D 08/24/2019   Insomnia due to mental condition 05/31/2019   Bipolar I disorder, most recent episode depressed ([place]) 05/31/2019   Alcohol use disorder, moderate, in sustained remission ([place]) 05/31/2019   Elevated tumor markers 05/27/2019   GAD (generalized anxiety disorder) 04/10/2019   MDD (major depressive disorder), severe ([place]) 01/31/2019   High serum carbohydrate antigen 19-9 (CA19-9) 12/01/2018   Adjustment disorder with anxious mood 11/24/2018   Essential hypertension 11/24/2018   B12 deficiency 10/17/2018   Incisional hernia, without obstruction or gangrene 09/12/2018   Genetic testing 04/07/2018   Malignant neoplasm of upper-outer quadrant of right breast in female, estrogen receptor positive ([place]) 03/17/2018   Malignant neoplasm of upper-inner quadrant of right breast in female, estrogen receptor positive ([place]) 03/17/2018   History of psychosis 02/07/2018   History of insomnia 02/07/2018   Deneise Lever, [place], CCC-SLP Speech-Language Pathologist  Aliene Altes 08/07/2021, 5:56 PM  [place] [address] [place], [place], [address] Phone: [phone]   Fax:  [phone]   Name: Kendra [name] MRN: [id number] Date of Birth: 08/25/1946

## 2021-08-07 NOTE — Therapy (Signed)
[place] MAIN [facility] SERVICES [address] [place], [place], [address] Phone: [phone]   Fax:  [phone]  Physical Therapy Treatment/DISCHARGE  Patient Details  Name: Kendra [name] MRN: [id number] Date of Birth: 1945/11/30 Referring Provider (PT): Arbutus Ped., MD   Encounter Date: 08/07/2021   PT End of Session - 08/08/21 0910     Visit Number 17    Number of Visits 17    Date for PT Re-Evaluation 08/13/21    Authorization Type LSVT BIG    Authorization Time Period Evaluation performed on 07/09/2021    PT Start Time 1008    PT Stop Time 1100    PT Time Calculation (min) 52 min    Equipment Utilized During Treatment Gait belt    Activity Tolerance Patient tolerated treatment well    Behavior During Therapy [facility] for tasks assessed/performed             Past Medical History:  Diagnosis Date   Anxiety    Breast cancer ([place]) 03/2018   right breast cancer at 11:00 and 1:00   Bursitis    bilateral hips and knees   Complication of anesthesia    Depression    Family history of breast cancer 03/24/2018   Family history of lung cancer 03/24/2018   History of alcohol dependence ([place]) 2007   no ETOH; resolved since 2007   History of hiatal hernia    History of psychosis 2014   due to sleep disturbance   Hypertension    Parkinson disease ([place])    Personal history of radiation therapy 06/2018-07/2018   right breast ca   PONV (postoperative nausea and vomiting)     Past Surgical History:  Procedure Laterality Date   BREAST BIOPSY Right 03/14/2018   11:00 DCIS and invasive ductal carcinoma   BREAST BIOPSY Right 03/14/2018   1:00 Invasive ductal carcinoma   BREAST LUMPECTOMY Right 04/27/2018   lumpectomy of 11 and 1:00 cancers, clear margins, negative LN   BREAST LUMPECTOMY WITH RADIOACTIVE SEED AND SENTINEL LYMPH NODE BIOPSY Right 04/27/2018   Procedure: RIGHT BREAST LUMPECTOMY WITH RADIOACTIVE SEED X 2 AND RIGHT  SENTINEL LYMPH NODE BIOPSY;  Surgeon: [name], MD;  Location: [place];  Service: General;  Laterality: Right;   CATARACT EXTRACTION W/PHACO Left 08/30/2019   Procedure: CATARACT EXTRACTION PHACO AND INTRAOCULAR LENS PLACEMENT ([place]) LEFT panoptix toric  01:20.5  12.7%  10.26;  Surgeon: Leandrew Koyanagi, MD;  Location: [place];  Service: Ophthalmology;  Laterality: Left;   CATARACT EXTRACTION W/PHACO Right 09/20/2019   Procedure: CATARACT EXTRACTION PHACO AND INTRAOCULAR LENS PLACEMENT ([facility]) RIGHT 5.71  00:36.4  15.7%;  Surgeon: Leandrew Koyanagi, MD;  Location: [place];  Service: Ophthalmology;  Laterality: Right;   LAPAROTOMY N/A 02/10/2018   Procedure: EXPLORATORY LAPAROTOMY;  Surgeon: Isabel Caprice, MD;  Location: [facility];  Service: Gynecology;  Laterality: N/A;   MASS EXCISION  01/2018   abdominal   OVARIAN CYST REMOVAL     SALPINGOOPHORECTOMY Bilateral 02/10/2018   Procedure: BILATERAL SALPINGO OOPHORECTOMY; PERITONEAL WASHINGS;  Surgeon: Isabel Caprice, MD;  Location: [facility];  Service: Gynecology;  Laterality: Bilateral;   TONSILLECTOMY     TONSILLECTOMY AND ADENOIDECTOMY  1953    There were no vitals filed for this visit.  Interventions - goals reassessed for discharge appointment. See goals for details  Pt has achieved 2 goals, previously achieved another goal (FOTO goal) with slight decrease in score today,  and has partially met 3 remaining goals.    Subjective Assessment - 08/08/21 0913     Subjective Spouse with pt. Pt reports continued hip/LE pain. She reports doing HEP.    Patient is accompained by: Family member   spouse, [name]   Pertinent History Pt presents to PT for LSVT BIG evaluation. Pt diagnosed with PD in 2019. Pt reports difficulty with gait speed, freezing, transfers, balance, cognition, and use of hands d/t tremors. Pt has previously attended PT for treatment of back pain. Per chart other PMH includes hx  of breast cancer, HTN, MCI, insomnia, cataracts, bipolar 1, alcohol use disorder, GAD, MDD, osteopenia of neck of R femur, chronic B low back pain with radiating pain to buttock and posterior thighs and calves    Limitations Sitting;Standing;Walking;Reading;Lifting;Writing;House hold activities    How long can you sit comfortably? limited if not well positioned. Feet must be propped up.    How long can you stand comfortably? not comfortable, pain is constant    How long can you walk comfortably? 5 min    Diagnostic tests No recent imaging pertinent to PD per chart. Other most recent imaging per Chancis, Juanda Crumble note 06/27/2021: 'MRI lumbar spine without contrast from [facility] dated 01/31/2020 imaging and report reviewed today. L5-S1 central disc bulging with severe bilateral facet arthropathy no central or foraminal stenosis. L4-5 grade 1 anterolisthesis with severe bilateral facet arthropathy and ligamentum flavum hypertrophy. She has severe right and moderate to severe left foraminal stenosis. Moderate central stenosis. L3-4 broad-based disc bulging with severe bilateral facet arthropathy and ligamentum flavum hypertrophy. Severe central stenosis. Moderate severe subarticular stenosis. No foraminal stenosis. L2-3 diffuse disc bulging with severe bilateral facet arthropathy and ligamentum flavum hypertrophy. Severe central stenosis and severe bilateral subarticular recess stenosis. Moderate left and mild to moderate right foraminal stenosis. L1-2 broad-based disc bulging with mild bilateral facet arthropathy. No central or foraminal stenosis."    Patient Stated Goals Pt would like to be able to walk faster, get up from a chair more easily    Currently in Pain? Yes    Pain Location Hip    Pain Orientation Left    Pain Onset More than a month ago    Multiple Pain Sites Yes    Pain Location Leg    Pain Orientation Left    Pain Onset More than a month ago            Opening a jar- completes with  2 twists to open jar. FOTO - 45 (previously 48) 5xSTS: 15 sec hands free 10MWT: 1.03 m/s TUG: 12.5 sec  Follow-up Questions:    Now that you have completed this treatment have you noticed changes in movement, function or mobility? Pt reports she has noticed changes in her movement. She is more self-aware regarding her movement, and thinks more often about moving faster/bigger.   Have other people commented that you are moving better now? Yes? No? What have they said (if they've said anything)? Pt spouse noticed improvement in pt's movement but that pt has trouble sustaining it. Pt reports noticed a longer gait compared to when she started. Pt spouse thinks she returns to a smaller gait when she has increases in hip pain.   Do people offer to help you as much as they used to with movement or mobility related tasks? Pt reports no. Pt spouse reports this has not really changed.   When you want to move as well as possible, what do you do? --  how often do you do that? Pt reports she tries to move as well as often as she can. She says she tries to apply big movement when she doesn't have pain.   Do you move more or are you more active now that you've completed LSVT BIG? Pt reports she thinks she is now more active.  Has your balance improved? Pt reports it depends. Pt reports overall she thinks balance has remained the same.   Are you able to walk faster since completing treatment? Please describe if yes. Yes and no. This varies with pt's pain.   Are there other tasks you are able to complete in less time since completing treatment (dressing, getting in or out of a vehicle?). If yes please describe: open a jar more quickly, getting out of a chair more quickly. Pt has difficulty answering questions in part due to cognition.    How many falls have you had I the last month? 0   Has your stamina improved? Please describe? Pt reports it is "somewhat the same."   Are there things you can do now  without help that you needed help with prior to treatment? If so what are those things? opening a jar, pt has difficulty further answering question d/t cognition.   PT instructs pt through interventions as time permits, with focus on areas pt had most difficulty with:   INTERVENTIONS    LSVT BIG Treatment:   Patient seen for LSVT Daily Session Maximal Daily Exercises for facilitation/coordination of movement Sustained movements are designed to rescale the amplitude of movement output for generalization to daily functional activities.   Adapted Maximal Daily Exercises: Floor to Ceiling 4 reps with 10 second hold;  Side to Side 4 reps Bilateral with 10 second hold  Forward Step and Reach 5 Bilateral Sideways Step and Reach 5 Bilateral Backwards Step and Reach 10 Bilateral - modified to Seated Maximal Daily Exercise with demo/VC for technique Forwards Rock and Reach 5 Bilateral Sideways Rock and Reach 10 Bilateral - modified to Seated Maximal Daily exercise with demo/VC for technique.    Daily Exercise Comments: Pt performing exercises 5 and 7 seated. Maximal daily exercises performed as able due to remaining time limitations following questionnaires and goal reassessment. Pt still with difficulty performing backwards step and reach, requiring frequent cues and reference to handout. However, pt spouse is able to help pt with this exercise after d/c. Pt shows improved performance of sideways rock and reach requiring less cuing compared to previous sessions.    Functional Component Tasks - performance and progress: Sit to Stand x 5 from standard chair  2. Walk and turn 5x  3. Bend and reach - unable to perform d/t time 4. Open Jar 5x  - goal  5. Step up on step - unable to perform d/t time Comments: PT reviewed opening a jar for goals and STS as time permitted. Pt independent with functional component tasks.    Hierarchy Tasks - performance and progress: Contact guard assist provided  throughout unless otherwise noted for all upright tasks Going for a walk outside (uneven, varied surfaces); unable to perform d/t time Comments: Unable to perform due to time limitations. See gait below, however, as pt and spouse instructed again in use of 4WW for gait.    2. Handwriting - maintaining big handwriting while simultaneously scaling back to be within the lines. Pt practices writing as if she were to fill out a check, writing her signature, date, birthday gift, "cookie monster." --  unable to perform this session d/t time limitations. However, pt previously demonstrated independence with task.    3. Ascending/descending stairs; big walk up to stairs from other side of clinic with turning, pt then performs big reach to handrails, upright posture and big ascend/descent down stairs while holding a conversation. --- unable to perform this session.   Gait - performance and progress: 2x148 laps around clinic/in hallway with 4WW. PT instructs pt and pt spouse on technique with 4WW. Pt demos good ability to properly use brakes, navigate around people, obstacles. Pt also with improved postural stability and sustained increased B step-length, gait speed. PT instructs pt and spouse in obtaining 4WW to use outside of PT and in the community for pt safety and to improve endurance. Pt spouse and pt verbalize understanding.    Carryover Assignment: Updated on this date - continue to practice previous carryover assignments beyond PT!  Homework: Pt to perform all 7 LSVT BIG exercises (adapted and seated for 5 and 7 see above), 5 Functional Component Tasks and big walking.  Reinforced patient in safety with adapted and seated maximal daily exercises.  PT continues to reinforce the following safety instructions from prior sessions. PT instructed patient on use of upper extremity support with exercises.  Patient verbalized understanding. Updated and instructed pt in carryover task.     Pt educated throughout  session about proper posture and technique with exercises. Improved exercise technique, movement at target joints, use of target muscles after min to mod verbal, visual, tactile cues.     PT Education - 08/08/21 0914     Education provided Yes    Education Details exercise technique, body mechanics, d/c recommendations, goal reassessment, HEP, AD instruction    Person(s) Educated Patient;Spouse    Methods Explanation;Demonstration;Tactile cues;Verbal cues;Handout    Comprehension Verbalized understanding;Returned demonstration;Verbal cues required;Tactile cues required              PT Short Term Goals - 08/08/21 0839       PT SHORT TERM GOAL #1   Title Pt will demonstrate independence with LSVT BIG HEP to improve strength/mobility for improved safety and ease with ADLs and to maintain/continue gains beyond LSVT program.    Baseline 9/7: to be initiated next session; 9/26: pt making gains, more indepndent with exercises, but still requires some cuing; 10/6: Pt indep with majority of exercises, but still struggles with maximal daily exercises 5 and 7. Pt spouse is able to help pt perform exercises with correct technique.    Time 2    Period Weeks    Status Partially Met    Target Date 08/11/21               PT Long Term Goals - 08/08/21 0841       PT LONG TERM GOAL #1   Title Patient will increase FOTO score to equal to or greater than 47  to demonstrate statistically significant improvement in mobility and quality of life.    Baseline 9/7: 34; 9/26: 48; 10/6: 45 (previously achieved)    Time 5    Period Weeks    Status Achieved      PT LONG TERM GOAL #2   Title Patient (> 22 years old) will complete five times sit to stand test in < 15 seconds indicating decreased fall risk.    Baseline 9/7: 18.5 sec; 9/26: 16.26 sec hands free; 10/6: 15 seconds hands free    Time 5    Period Weeks  Status Partially Met      PT LONG TERM GOAL #3   Title Patient will increase 10  meter walk test to >1.60m/s with [facility] B step-length for 100% of test to improve ease with gait and to override hypokentic and bradykinetic movement.    Baseline 9/7: 0.74 m/s, decreased B step length and rigidty of movement; 9/26 deferred; 10/6: 1.03 m/s    Time 5    Period Weeks    Status Achieved      PT LONG TERM GOAL #4   Title Patient will reduce timed up and go to <11 seconds to reduce fall risk and demonstrate improved transfer/gait ability and ability to perform turns safely    Baseline 9/7: 17 seconds 9/26: deferred 10/6: 12.5 seconds    Time 5    Period Weeks    Status Partially Met      PT LONG TERM GOAL #5   Title Pt will demo or report ability to open standard jar using only 1-2 attempts in order to exhibit improved functional mobility for ADLs.    Baseline 9/7: Pt reports she is unable to open jars; 9/26: pt ableto open in 2 attempts; 10/6: has maintained gains, sitll achieved    Time 5    Period Weeks    Status Achieved                   Plan - 08/08/21 0902     Clinical Impression Statement Session slighlty limited secondary to pt late arrival to appointment. Goals reassessed for d/c as pt has completed LSVT BIG treatments. Pt has achieved 3 goals and partially met remaining goals. Over the course of LSVT pt has demonstrated improvements in perceived functional mobility and QOL, decreased fall risk and improved BLE power, and improved gait speed AEB FOTO, 5xSTS, 10MWT and TUG performance. Pt also now able to consistently open jars in 1 to 2 attempts, sustaining larger amplitude movements, overriding hypokinesia and bradykinesia. While pt has shown much improvement, she was not able to achieve full independence with HEP d/t cognition, however, pt spouse able to help her continue HEP after discharge. While pt has improved her gait speed, she has difficulty sustaining this over long periods of time in part due to hip pain and endurance. PT has recommended pt use a 4WW  beyond PT for improved postural stability/decreased fall risk and for improved gait speed and ease with mobility. PT also recommends that pt follow up with her doctor for referral for PT specific to address her hip/LE pain as this is still a limiting factor decreasing safety and ease with gait/ADLs. PT provided education to pt and spouse on importance of continuing HEP beyond discharge to continue and maintain gains. Pt and spouse verbalized understanding for all. The pt has completed LSVT BIG treatments and is to be discharged at this time.    Personal Factors and Comorbidities Age;Sex;Fitness;Time since onset of injury/illness/exacerbation;Comorbidity 1;Comorbidity 3+;Comorbidity 2    Comorbidities MCI, HTN, CA, cataracts, bipolar 1, GAD, insomnia, chronic LBP    Examination-Activity Limitations Bend;Sit;Squat;Stairs;Stand;Locomotion Level;Self Feeding;Bathing;Transfers;Lift;Bed Mobility    Examination-Participation Restrictions Cleaning;Community Activity;Shop;Yard Work;Meal Prep;Personal Finances;Driving    Stability/Clinical Decision Making Evolving/Moderate complexity    Rehab Potential Fair    Clinical Impairments Affecting Rehab Potential MCI, chronic back pain    PT Frequency 4x / week    PT Duration Other (comment)   5 weeks   PT Treatment/Interventions ADLs/Self Care Home Management;Therapeutic exercise;Patient/family education;Neuromuscular re-education;Therapeutic activities;Functional mobility training;Balance training;Gait training;Stair  training;Taping;Energy conservation;Passive range of motion;DME Instruction;Cognitive remediation    PT Next Visit Plan Initiate LSVT BIG Maximal daily exercises (see note for adaptions), functional component tasks, hierarchy tasks, carryover tasks, big gait/walking, TUG, 73mwt    PT Home Exercise Plan LSVT 7 adapted maximal daily exercises (exercies 5 and 7 performed with seated versions), 5 functional component tasks, hierarchy tasks, big walking, and  carryover task (updated 08/07/2021)    Consulted and Agree with Plan of Care Patient             Patient will benefit from skilled therapeutic intervention in order to improve the following deficits and impairments:  Abnormal gait, Decreased activity tolerance, Decreased cognition, Decreased endurance, Decreased knowledge of use of DME, Decreased range of motion, Decreased strength, Hypomobility, Dizziness, Pain, Improper body mechanics, Impaired UE functional use, Decreased balance, Decreased coordination, Decreased mobility, Difficulty walking, Increased edema, Impaired flexibility, Increased muscle spasms, Postural dysfunction  Visit Diagnosis: Other abnormalities of gait and mobility  Other lack of coordination  Muscle weakness (generalized)  Unsteadiness on feet  Parkinson disease (H[facility])  Pain in left leg     Problem List Patient Active Problem List   Diagnosis Date Noted   Chronic bilateral low back pain 06/18/2021   Elevated blood pressure reading 05/23/2021   Osteopenia of neck of right femur 09/06/2020   Obesity 08/29/2020   Parkinson disease ([place]) 02/22/2020   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   Bipolar disorder, in full remission, most recent episode depressed ([place]) 11/08/2019   Avitaminosis D 08/24/2019   Insomnia due to mental condition 05/31/2019   Bipolar I disorder, most recent episode depressed ([place]) 05/31/2019   Alcohol use disorder, moderate, in sustained remission ([place]) 05/31/2019   Elevated tumor markers 05/27/2019   GAD (generalized anxiety disorder) 04/10/2019   MDD (major depressive disorder), severe ([place]) 01/31/2019   High serum carbohydrate antigen 19-9 (CA19-9) 12/01/2018   Adjustment disorder with anxious mood 11/24/2018   Essential hypertension 11/24/2018   B12 deficiency 10/17/2018   Incisional hernia, without obstruction or gangrene 09/12/2018   Genetic testing 04/07/2018   Malignant neoplasm of upper-outer quadrant of right breast in  female, estrogen receptor positive ([place]) 03/17/2018   Malignant neoplasm of upper-inner quadrant of right breast in female, estrogen receptor positive ([place]) 03/17/2018   History of psychosis 02/07/2018   History of insomnia 02/07/2018    Zollie Pee, PT 08/08/2021, 9:14 AM  [place] MAIN [facility] SERVICES [address] [place], [place], [address] Phone: [phone]   Fax:  [phone]  Name: Kendra [name] MRN: [id number] Date of Birth: [birth year]

## 2021-08-10 ENCOUNTER — Other Ambulatory Visit: Payer: Self-pay | Admitting: Family Medicine

## 2021-08-12 ENCOUNTER — Other Ambulatory Visit: Payer: Self-pay

## 2021-08-12 ENCOUNTER — Encounter: Payer: Self-pay | Admitting: Family Medicine

## 2021-08-12 ENCOUNTER — Ambulatory Visit: Payer: Medicare PPO | Admitting: Family Medicine

## 2021-08-12 ENCOUNTER — Ambulatory Visit: Payer: Medicare PPO | Admitting: Speech Pathology

## 2021-08-12 VITALS — BP 138/73 | HR 98 | Temp 98.4°F | Resp 16 | Ht 66.0 in | Wt 212.2 lb

## 2021-08-12 DIAGNOSIS — R278 Other lack of coordination: Secondary | ICD-10-CM | POA: Diagnosis not present

## 2021-08-12 DIAGNOSIS — R471 Dysarthria and anarthria: Secondary | ICD-10-CM

## 2021-08-12 DIAGNOSIS — K432 Incisional hernia without obstruction or gangrene: Secondary | ICD-10-CM

## 2021-08-12 DIAGNOSIS — I10 Essential (primary) hypertension: Secondary | ICD-10-CM | POA: Diagnosis not present

## 2021-08-12 DIAGNOSIS — R2689 Other abnormalities of gait and mobility: Secondary | ICD-10-CM | POA: Diagnosis not present

## 2021-08-12 DIAGNOSIS — G20 Parkinson's disease: Secondary | ICD-10-CM

## 2021-08-12 DIAGNOSIS — G20A1 Parkinson's disease without dyskinesia, without mention of fluctuations: Secondary | ICD-10-CM

## 2021-08-12 DIAGNOSIS — M6281 Muscle weakness (generalized): Secondary | ICD-10-CM | POA: Diagnosis not present

## 2021-08-12 DIAGNOSIS — Z79899 Other long term (current) drug therapy: Secondary | ICD-10-CM | POA: Diagnosis not present

## 2021-08-12 DIAGNOSIS — Z23 Encounter for immunization: Secondary | ICD-10-CM

## 2021-08-12 DIAGNOSIS — M79605 Pain in left leg: Secondary | ICD-10-CM | POA: Diagnosis not present

## 2021-08-12 DIAGNOSIS — R2681 Unsteadiness on feet: Secondary | ICD-10-CM | POA: Diagnosis not present

## 2021-08-12 NOTE — Assessment & Plan Note (Signed)
Finished PT [place] program Continue to follow with Neuro

## 2021-08-12 NOTE — Progress Notes (Signed)
Established patient visit   Patient: Kendra [name]   DOB: [birth year]   75 y.o. Female  MRN: [id number] Visit Date: 08/12/2021  Today's healthcare provider: Lavon Paganini, MD   Chief Complaint  Patient presents with   Hypertension   Subjective    HPI  Hypertension, follow-up  BP Readings from Last 3 Encounters:  08/12/21 138/73  06/18/21 (!) 155/77  05/15/21 136/80   Wt Readings from Last 3 Encounters:  08/12/21 212 lb 3.2 oz (96.3 kg)  06/18/21 212 lb 8 oz (96.4 kg)  05/15/21 208 lb (94.3 kg)     She was last seen for hypertension 2 months ago.  BP at that visit was 155/77. Management since that visit includes no changes.  She reports excellent compliance with treatment. She is not having side effects.  She is following a Low Sodium diet. She is exercising. She does not smoke.  Use of agents associated with hypertension: none.   Outside blood pressures are not being checked. Symptoms: No chest pain No chest pressure  No palpitations No syncope  No dyspnea No orthopnea  No paroxysmal nocturnal dyspnea Yes lower extremity edema   Pertinent labs: Lab Results  Component Value Date   CHOL 185 02/27/2021   HDL 59 02/27/2021   LDLCALC 101 (H) 02/27/2021   TRIG 141 02/27/2021   CHOLHDL 3.1 02/27/2021   Lab Results  Component Value Date   NA 136 02/24/2021   K 4.0 02/24/2021   CREATININE 0.85 02/24/2021   GFRNONAA >60 02/24/2021   GLUCOSE 127 (H) 02/24/2021     The 10-year ASCVD risk score (Arnett DK, et al., 2019) is: 19.4%   ---------------------------------------------------------------------------------------------------     Medications: Outpatient Medications Prior to Visit  Medication Sig   Acetaminophen 500 MG capsule Take 1,000 mg by mouth every 6 (six) hours as needed.    carbidopa-levodopa (SINEMET CR) 50-200 MG tablet Take by mouth 4 (four) times daily.   carbidopa-levodopa (SINEMET IR) 25-100 MG tablet Take 1 tablet by mouth at  bedtime.    cyanocobalamin (,VITAMIN B-12,) 1000 MCG/ML injection INJECT 1 ML INTO THE MUSCLE EVERY 30 DAYS   denosumab (PROLIA) 60 MG/ML SOSY injection Inject 60 mg into the skin every 6 (six) months. takes annually   diclofenac Sodium (VOLTAREN) 1 % GEL Apply 4 g topically 4 (four) times daily as needed.   FLUoxetine HCl 60 MG TABS TAKE ONE TABLET BY MOUTH DAILY   gabapentin (NEURONTIN) 100 MG capsule Take 3 capsules (300 mg total) by mouth 3 (three) times daily.   gabapentin (NEURONTIN) 100 MG capsule Take 1 capsule (100 mg total) by mouth at bedtime.   letrozole (FEMARA) 2.5 MG tablet Take 1 tablet (2.5 mg total) by mouth daily.   lisinopril-hydrochlorothiazide (ZESTORETIC) 20-12.5 MG tablet Take 1 tablet by mouth daily.   meloxicam (MOBIC) 15 MG tablet TAKE ONE TABLET BY MOUTH DAILY   NYAMYC powder APPLY TOPICALLY THREE TIMES A DAY   polyethylene glycol (MIRALAX / GLYCOLAX) 17 g packet Take 17 g by mouth daily.   risperiDONE (RISPERDAL) 0.5 MG tablet Take 1-2 tablets (0.5-1 mg total) by mouth as directed. Take 1 tablet daily in the AM and 2 tablets at bedtime   traMADol (ULTRAM) 50 MG tablet Take by mouth.   traZODone (DESYREL) 100 MG tablet Take 1 tablet (100 mg total) by mouth at bedtime.   Facility-Administered Medications Prior to Visit  Medication Dose Route Frequency Provider   lidocaine (PF) (XYLOCAINE) 1 %  injection 4 mL  4 mL Intradermal Once Evony Rezek, Dionne Bucy, MD   lidocaine (PF) (XYLOCAINE) 1 % injection 4 mL  4 mL Intradermal Once Virginia Crews, MD    Review of Systems  Constitutional:  Negative for activity change, appetite change and fatigue.  Respiratory:  Negative for chest tightness and shortness of breath.   Cardiovascular:  Positive for leg swelling. Negative for chest pain and palpitations.  Musculoskeletal:  Positive for neck pain and neck stiffness.      Objective    BP 138/73 (BP Location: Left Arm, Patient Position: Sitting, Cuff Size: Large)    Pulse 98   Temp 98.4 F (36.9 C) (Oral)   Resp 16   Ht 5\' 6"  (1.676 m)   Wt 212 lb 3.2 oz (96.3 kg)   BMI 34.25 kg/m  {Show previous vital signs (optional):23777}  Physical Exam Vitals reviewed.  Constitutional:      General: She is not in acute distress.    Appearance: Normal appearance. She is well-developed. She is not diaphoretic.  HENT:     Head: Normocephalic and atraumatic.  Eyes:     General: No scleral icterus.    Conjunctiva/sclera: Conjunctivae normal.  Neck:     Thyroid: No thyromegaly.  Cardiovascular:     Rate and Rhythm: Normal rate and regular rhythm.     Pulses: Normal pulses.     Heart sounds: Normal heart sounds. No murmur heard. Pulmonary:     Effort: Pulmonary effort is normal. No respiratory distress.     Breath sounds: Normal breath sounds. No wheezing, rhonchi or rales.  Musculoskeletal:     Cervical back: Neck supple.     Right lower leg: No edema.     Left lower leg: No edema.  Lymphadenopathy:     Cervical: No cervical adenopathy.  Skin:    General: Skin is warm and dry.     Findings: No rash.  Neurological:     Mental Status: She is alert and oriented to person, place, and time. Mental status is at baseline.  Psychiatric:        Mood and Affect: Mood normal.        Behavior: Behavior normal.      No results found for any visits on 08/12/21.  Assessment & Plan     Problem List Items Addressed This Visit       Cardiovascular and Mediastinum   Essential hypertension - Primary    Well controlled Continue current medications Recheck metabolic panel at next visit        Nervous and Auditory   Parkinson disease ([place])    Finished PT [facility] program Continue to follow with Neuro        Other   Incisional hernia, without obstruction or gangrene    Has been eval'd by Gen Surg and would be candidate for repair She is questioning this and still unsure if she wants repair at this time Discussed risks and benefits of waiting vs  repair Discussed red flags      Polypharmacy    Went over all of her medicaitons and discussed their necessity Answered all questions No changes today      Other Visit Diagnoses     Needs flu shot       Relevant Orders   Flu Vaccine QUAD High Dose(Fluad) (Completed)        Return for as scheduled.      Total time spent on today's visit was greater  than 30 minutes, including both face-to-face time and nonface-to-face time personally spent on review of chart (labs and imaging), discussing labs and goals, discussing further work-up, treatment options, referrals to specialist if needed, reviewing outside records of pertinent, answering patient's questions, and coordinating care.    I, Lavon Paganini, MD, have reviewed all documentation for this visit. The documentation on 08/12/21 for the exam, diagnosis, procedures, and orders are all accurate and complete.   Talen Poser, Dionne Bucy, MD, MPH [facility]

## 2021-08-12 NOTE — Therapy (Signed)
[place] [facility] [address] [place], [place], [address] Phone: [phone]   Fax:  [phone]  Speech Language Pathology Treatment and Discharge Summary  Patient Details  Name: Kendra [name] MRN: [id number] Date of Birth: 1946/05/25 Referring Provider (SLP): Jerrol Banana., MD   Encounter Date: 08/12/2021   End of Session - 08/12/21 1612     Visit Number 17    Number of Visits 17    Date for SLP Re-Evaluation 10/07/21    Authorization - Visit Number 7    Progress Note Due on Visit 10    SLP Start Time 1305   arrived late   SLP Stop Time  1400    SLP Time Calculation (min) 55 min    Activity Tolerance Patient tolerated treatment well             Past Medical History:  Diagnosis Date   Anxiety    Breast cancer ([place]) 03/2018   right breast cancer at 11:00 and 1:00   Bursitis    bilateral hips and knees   Complication of anesthesia    Depression    Family history of breast cancer 03/24/2018   Family history of lung cancer 03/24/2018   History of alcohol dependence ([place]) 2007   no ETOH; resolved since 2007   History of hiatal hernia    History of psychosis 2014   due to sleep disturbance   Hypertension    Parkinson disease ([place])    Personal history of radiation therapy 06/2018-07/2018   right breast ca   PONV (postoperative nausea and vomiting)     Past Surgical History:  Procedure Laterality Date   BREAST BIOPSY Right 03/14/2018   11:00 DCIS and invasive ductal carcinoma   BREAST BIOPSY Right 03/14/2018   1:00 Invasive ductal carcinoma   BREAST LUMPECTOMY Right 04/27/2018   lumpectomy of 11 and 1:00 cancers, clear margins, negative LN   BREAST LUMPECTOMY WITH RADIOACTIVE SEED AND SENTINEL LYMPH NODE BIOPSY Right 04/27/2018   Procedure: RIGHT BREAST LUMPECTOMY WITH RADIOACTIVE SEED X 2 AND RIGHT SENTINEL LYMPH NODE BIOPSY;  Surgeon: [name], MD;  Location: [facility];   Service: General;  Laterality: Right;   CATARACT EXTRACTION W/PHACO Left 08/30/2019   Procedure: CATARACT EXTRACTION PHACO AND INTRAOCULAR LENS PLACEMENT ([place]) LEFT panoptix toric  01:20.5  12.7%  10.26;  Surgeon: Leandrew Koyanagi, MD;  Location: [facility];  Service: Ophthalmology;  Laterality: Left;   CATARACT EXTRACTION W/PHACO Right 09/20/2019   Procedure: CATARACT EXTRACTION PHACO AND INTRAOCULAR LENS PLACEMENT ([facility]) RIGHT 5.71  00:36.4  15.7%;  Surgeon: Leandrew Koyanagi, MD;  Location: [facility];  Service: Ophthalmology;  Laterality: Right;   LAPAROTOMY N/A 02/10/2018   Procedure: EXPLORATORY LAPAROTOMY;  Surgeon: Isabel Caprice, MD;  Location: [facility];  Service: Gynecology;  Laterality: N/A;   MASS EXCISION  01/2018   abdominal   OVARIAN CYST REMOVAL     SALPINGOOPHORECTOMY Bilateral 02/10/2018   Procedure: BILATERAL SALPINGO OOPHORECTOMY; PERITONEAL WASHINGS;  Surgeon: Isabel Caprice, MD;  Location: [facility];  Service: Gynecology;  Laterality: Bilateral;   TONSILLECTOMY     TONSILLECTOMY AND ADENOIDECTOMY  1953    There were no vitals filed for this visit.   Subjective Assessment - 08/12/21 1605     Subjective "I had another meeting this morning."    Patient is accompained by: Family member   spouse, [name]   Currently in Pain? No/denies   none at rest  ADULT SLP TREATMENT - 08/12/21 1612       General Information   Behavior/Cognition Alert;Cooperative    HPI Patient is a 75 y.o. female with history of Parkinson's disease, anxiety, depression, psychosis, HTN. bipolar disorder, alcoholism, memory issues since 2005, hx head trauma. Referred for LSVT BIG and LOUD.      Cognitive-Linquistic Treatment   Treatment focused on Dysarthria;Patient/family/caregiver education    Skilled Treatment LSVT Daily tasks with moderate cues necessary today; pt reports fatigue after being at MD appointment this morning. Loud /a/ averaged 83 dB  (improved from 75 dB at eval), duration 12.1 seconds, pitch avg 220 Hz.  Max F0 for pitch glides was 303 Hz (85dB avg), min F0 for low glides was 181 Hz (85 dB avg). Functional phrases averaged 82 dB with modified independence. Hierarchical loudness drills at conversation level averaged 78 dB (improved from avg 73dB at eval), with occasional cues for loudness.        Assessment / Recommendations / Plan   Plan Continue with current plan of care      Progression Toward Goals   Progression toward goals Goals met, education completed, patient discharged from [facility] Education - 08/12/21 1612     Education Details maintenance activities, continue to use effort level 8 at all times    Person(s) Educated Patient    Methods Explanation    Comprehension Verbalized understanding              SLP Short Term Goals - 07/29/21 1555       SLP SHORT TERM GOAL #1   Title The patient will complete Daily Tasks (Maximum duration "ah", High/Lows, and Functional Phrases) at average loudness >/= 85 dB and with loud, good quality voice with min cues.    Time 10    Period --   sessions   Status Achieved      SLP SHORT TERM GOAL #2   Title The patient will complete Hierarchal Speech Loudness reading drills (words/phrases, sentences) at average >/= 75 dB and with loud, good quality voice with min cues.    Time 10    Period --   sessions   Status Achieved      SLP SHORT TERM GOAL #3   Title The patient will participate in 5-8 minutes conversation, maintaining average loudness of 75 dB and loud, good quality voice with min cues.    Time 10    Period --   sessions   Status Achieved              SLP Long Term Goals - 08/12/21 1613       SLP LONG TERM GOAL #1   Title The patient will complete Daily Tasks (Maximum duration "ah", High/Lows, and Functional Phrases) at average loudness >/= 85 dB and with loud, good quality voice    Time 4    Period Weeks   or 17 sessions, for  all LTGs   Status Partially Met      SLP LONG TERM GOAL #2   Title The patient will complete Hierarchal Speech Loudness reading drills (words/phrases, sentences, and paragraph) at average >/= 75 dB and with loud, good quality voice.    Time 4    Period Weeks    Status Achieved      SLP LONG TERM GOAL #3   Title The patient will participate in 15-20 minutes conversation, maintaining average loudness of >/=  75 dB and loud, good quality voice.    Time 4    Period Weeks    Status Achieved      SLP LONG TERM GOAL #4   Title Patient will report improved communication effectiveness as measured by Communicative Effectiveness Survey.    Baseline 19/32 at eval    Time 4    Period Weeks    Status Not Met   not assessed             Plan - 08/12/21 1613     Clinical Impression Statement Patient continues with mild hypokinetic dysarthria. Required cuing with daily tasks and in conversation today; timing of session/medication dosage likely contributing. Pt/spouse report more fatigue later in the day. Patient is able to maintain average of 78 dB in conversation; she has met 2/4 LTGs. Pt pleased with current functional level and is in agreement with d/c today. Educated on maintenance activities for home; anticipate pt will continue to require intermittent cuing from spouse due to cognitive deficits.    Speech Therapy Frequency 4x / week    Duration 4 weeks    Treatment/Interventions Language facilitation;Environmental controls;Cueing hierarchy;SLP instruction and feedback;Compensatory techniques;Functional tasks;Patient/family education;Multimodal communcation approach   LSVT LOUD   Potential to Achieve Goals Good    Potential Considerations Ability to learn/carryover information    SLP Home Exercise Plan to be provided next session    Consulted and Agree with Plan of Care Patient;Family member/caregiver    Family Member Consulted spouse             Patient will benefit from skilled  therapeutic intervention in order to improve the following deficits and impairments:   Dysarthria and anarthria  Parkinson disease ([place])    Problem List Patient Active Problem List   Diagnosis Date Noted   Polypharmacy 08/12/2021   Chronic bilateral low back pain 06/18/2021   Elevated blood pressure reading 05/23/2021   Osteopenia of neck of right femur 09/06/2020   Obesity 08/29/2020   Parkinson disease ([place]) 02/22/2020   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   Bipolar disorder, in full remission, most recent episode depressed ([place]) 11/08/2019   Avitaminosis D 08/24/2019   Insomnia due to mental condition 05/31/2019   Bipolar I disorder, most recent episode depressed ([place]) 05/31/2019   Alcohol use disorder, moderate, in sustained remission ([place]) 05/31/2019   Elevated tumor markers 05/27/2019   GAD (generalized anxiety disorder) 04/10/2019   MDD (major depressive disorder), severe ([place]) 01/31/2019   High serum carbohydrate antigen 19-9 (CA19-9) 12/01/2018   Adjustment disorder with anxious mood 11/24/2018   Essential hypertension 11/24/2018   B12 deficiency 10/17/2018   Incisional hernia, without obstruction or gangrene 09/12/2018   Genetic testing 04/07/2018   Malignant neoplasm of upper-outer quadrant of right breast in female, estrogen receptor positive ([place]) 03/17/2018   Malignant neoplasm of upper-inner quadrant of right breast in female, estrogen receptor positive ([place]) 03/17/2018   History of psychosis 02/07/2018   History of insomnia 02/07/2018   SPEECH THERAPY DISCHARGE SUMMARY  Visits from Start of Care: 17  Current functional level related to goals / functional outcomes: Patient able to maintain avg 78 dB in 15-20 minute conversation; depending on time of day/fatigue level/medication dosing needs occasional reminders from communication partner.    Remaining deficits: Mild hypokinetic dysarthria characterized by reduced vocal intensity and pitch variation; requires  cuing intermittently due to cognitive deficits.   Education / Equipment: LOUD maintenance activities, Parkinson's support groups   Patient agrees to discharge. Patient goals were  partially met. Patient is being discharged due to being pleased with the current functional level.Deneise Lever, [place], [place] Speech-Language Pathologist  Aliene Altes 08/12/2021, 4:15 PM  [place] [facility] [address] [place], [place], [address] Phone: [phone]   Fax:  [phone]   Name: Roselynn Whitacre MRN: [id number] Date of Birth: August 14, 1946

## 2021-08-12 NOTE — Assessment & Plan Note (Signed)
Has been eval'd by Gen Surg and would be candidate for repair She is questioning this and still unsure if she wants repair at this time Discussed risks and benefits of waiting vs repair Discussed red flags

## 2021-08-12 NOTE — Assessment & Plan Note (Signed)
Well controlled Continue current medications Recheck metabolic panel at next visit  

## 2021-08-12 NOTE — Assessment & Plan Note (Signed)
Went over all of her medicaitons and discussed their necessity Answered all questions No changes today

## 2021-08-13 ENCOUNTER — Ambulatory Visit: Payer: Self-pay | Admitting: *Deleted

## 2021-08-13 NOTE — Telephone Encounter (Addendum)
  Summary: Clincal Advice   Patient unsure why PCP prescribed meloxicam (MOBIC) 15 MG tablet and would like a follow up call      Attempted to reach pt, left VM to call back.  2 attempts to reach pt, left VM to call back.

## 2021-08-13 NOTE — Telephone Encounter (Signed)
7:28pm - Called pt and LMOM to return call. Left office number and forwarded notes to the office.

## 2021-08-14 DIAGNOSIS — M5416 Radiculopathy, lumbar region: Secondary | ICD-10-CM | POA: Diagnosis not present

## 2021-08-14 DIAGNOSIS — M48062 Spinal stenosis, lumbar region with neurogenic claudication: Secondary | ICD-10-CM | POA: Diagnosis not present

## 2021-08-14 NOTE — Telephone Encounter (Signed)
Patient returned call- patient advised Meloxicam is antiinflammatory usually prescribed for joint/muscle pain. It was prescribed by E payne, FNP on 06/18/21. Advised precautions when using- no NSAIDS. Patient states she will call back if she feels she needs RF.  Call ended- attempted to call patient back- left message to call office if needs anything or has other questions.

## 2021-08-18 DIAGNOSIS — H9193 Unspecified hearing loss, bilateral: Secondary | ICD-10-CM | POA: Diagnosis not present

## 2021-08-18 DIAGNOSIS — G20 Parkinson's disease: Secondary | ICD-10-CM | POA: Diagnosis not present

## 2021-08-20 ENCOUNTER — Other Ambulatory Visit: Payer: Self-pay | Admitting: Psychiatry

## 2021-08-20 DIAGNOSIS — F3176 Bipolar disorder, in full remission, most recent episode depressed: Secondary | ICD-10-CM

## 2021-08-21 ENCOUNTER — Encounter: Payer: Self-pay | Admitting: General Surgery

## 2021-08-25 ENCOUNTER — Inpatient Hospital Stay: Payer: Medicare PPO | Admitting: Internal Medicine

## 2021-08-25 ENCOUNTER — Inpatient Hospital Stay: Payer: Medicare PPO

## 2021-08-25 ENCOUNTER — Other Ambulatory Visit: Payer: Medicare PPO

## 2021-08-25 ENCOUNTER — Ambulatory Visit: Payer: Medicare PPO

## 2021-09-01 ENCOUNTER — Inpatient Hospital Stay: Payer: Medicare PPO | Attending: Internal Medicine

## 2021-09-01 ENCOUNTER — Inpatient Hospital Stay: Payer: Medicare PPO | Admitting: Internal Medicine

## 2021-09-01 ENCOUNTER — Inpatient Hospital Stay: Payer: Medicare PPO

## 2021-09-01 ENCOUNTER — Other Ambulatory Visit: Payer: Self-pay

## 2021-09-01 DIAGNOSIS — G20 Parkinson's disease: Secondary | ICD-10-CM | POA: Insufficient documentation

## 2021-09-01 DIAGNOSIS — Z833 Family history of diabetes mellitus: Secondary | ICD-10-CM | POA: Insufficient documentation

## 2021-09-01 DIAGNOSIS — E538 Deficiency of other specified B group vitamins: Secondary | ICD-10-CM | POA: Diagnosis not present

## 2021-09-01 DIAGNOSIS — Z90722 Acquired absence of ovaries, bilateral: Secondary | ICD-10-CM | POA: Insufficient documentation

## 2021-09-01 DIAGNOSIS — C50411 Malignant neoplasm of upper-outer quadrant of right female breast: Secondary | ICD-10-CM

## 2021-09-01 DIAGNOSIS — Z79899 Other long term (current) drug therapy: Secondary | ICD-10-CM | POA: Insufficient documentation

## 2021-09-01 DIAGNOSIS — Z170 Estrogen receptor positive status [ER+]: Secondary | ICD-10-CM | POA: Insufficient documentation

## 2021-09-01 DIAGNOSIS — Z801 Family history of malignant neoplasm of trachea, bronchus and lung: Secondary | ICD-10-CM | POA: Insufficient documentation

## 2021-09-01 DIAGNOSIS — Z803 Family history of malignant neoplasm of breast: Secondary | ICD-10-CM | POA: Insufficient documentation

## 2021-09-01 DIAGNOSIS — Z8249 Family history of ischemic heart disease and other diseases of the circulatory system: Secondary | ICD-10-CM | POA: Diagnosis not present

## 2021-09-01 DIAGNOSIS — M85851 Other specified disorders of bone density and structure, right thigh: Secondary | ICD-10-CM | POA: Insufficient documentation

## 2021-09-01 DIAGNOSIS — F419 Anxiety disorder, unspecified: Secondary | ICD-10-CM | POA: Diagnosis not present

## 2021-09-01 DIAGNOSIS — Z923 Personal history of irradiation: Secondary | ICD-10-CM | POA: Diagnosis not present

## 2021-09-01 DIAGNOSIS — I10 Essential (primary) hypertension: Secondary | ICD-10-CM | POA: Diagnosis not present

## 2021-09-01 LAB — CBC WITH DIFFERENTIAL/PLATELET
Abs Immature Granulocytes: 0.02 10*3/uL (ref 0.00–0.07)
Basophils Absolute: 0.1 10*3/uL (ref 0.0–0.1)
Basophils Relative: 1 %
Eosinophils Absolute: 0.4 10*3/uL (ref 0.0–0.5)
Eosinophils Relative: 8 %
HCT: 38.9 % (ref 36.0–46.0)
Hemoglobin: 12.9 g/dL (ref 12.0–15.0)
Immature Granulocytes: 0 %
Lymphocytes Relative: 14 %
Lymphs Abs: 0.7 10*3/uL (ref 0.7–4.0)
MCH: 28.7 pg (ref 26.0–34.0)
MCHC: 33.2 g/dL (ref 30.0–36.0)
MCV: 86.4 fL (ref 80.0–100.0)
Monocytes Absolute: 0.5 10*3/uL (ref 0.1–1.0)
Monocytes Relative: 10 %
Neutro Abs: 3.2 10*3/uL (ref 1.7–7.7)
Neutrophils Relative %: 67 %
Platelets: 189 10*3/uL (ref 150–400)
RBC: 4.5 MIL/uL (ref 3.87–5.11)
RDW: 13.8 % (ref 11.5–15.5)
WBC: 4.7 10*3/uL (ref 4.0–10.5)
nRBC: 0 % (ref 0.0–0.2)

## 2021-09-01 LAB — COMPREHENSIVE METABOLIC PANEL
ALT: 7 U/L (ref 0–44)
AST: 19 U/L (ref 15–41)
Albumin: 4 g/dL (ref 3.5–5.0)
Alkaline Phosphatase: 29 U/L — ABNORMAL LOW (ref 38–126)
Anion gap: 9 (ref 5–15)
BUN: 5 mg/dL — ABNORMAL LOW (ref 8–23)
CO2: 28 mmol/L (ref 22–32)
Calcium: 8.9 mg/dL (ref 8.9–10.3)
Chloride: 99 mmol/L (ref 98–111)
Creatinine, Ser: 0.69 mg/dL (ref 0.44–1.00)
GFR, Estimated: >60 mL/min (ref >60)
Glucose, Bld: 124 mg/dL — ABNORMAL HIGH (ref 70–99)
Potassium: 3.6 mmol/L (ref 3.5–5.1)
Sodium: 136 mmol/L (ref 135–145)
Total Bilirubin: 0.7 mg/dL (ref 0.3–1.2)
Total Protein: 6.7 g/dL (ref 6.5–8.1)

## 2021-09-01 LAB — VITAMIN B12: Vitamin B-12: 428 pg/mL (ref 180–914)

## 2021-09-01 MED ORDER — DENOSUMAB 60 MG/ML ~~LOC~~ SOSY
60.0000 mg | PREFILLED_SYRINGE | Freq: Once | SUBCUTANEOUS | Status: AC
  Administered 2021-09-01: 60 mg via SUBCUTANEOUS
  Filled 2021-09-01: qty 1

## 2021-09-01 NOTE — Progress Notes (Signed)
[place] NOTE  Patient Care Team: Brita Romp Dionne Bucy, MD as PCP - General (Family Medicine) Lequita Asal, MD (Inactive) as Consulting Physician (Hematology and Oncology) Noreene Filbert, MD as Referring Physician (Radiation Oncology) Leandrew Koyanagi, MD as Referring Physician (Ophthalmology) Blythe Stanford, PT (Inactive) as Physical Therapist (Physical Therapy)  CHIEF COMPLAINTS/PURPOSE OF CONSULTATION: Breast cancer.  HISTORY OF PRESENTING ILLNESS: Patient in wheelchair/Parkinson's.  Accompanied by husband.  Kendra [name] 75 y.o.  female patient with stage I breast cancer ER/PR positive HER2 negative-currently on Femara is here for follow-up.  Patient denies any worsening joint pains or bone pain.  Appetite is good.  No weight loss.  Denies any significant hot flashes.  Complains of gait instability-attributable to her Parkinson's.  No falls.  Review of Systems  Constitutional:  Positive for malaise/fatigue. Negative for chills, diaphoresis, fever and weight loss.  HENT:  Negative for nosebleeds and sore throat.   Eyes:  Negative for double vision.  Respiratory:  Negative for cough, hemoptysis, sputum production, shortness of breath and wheezing.   Cardiovascular:  Negative for chest pain, palpitations, orthopnea and leg swelling.  Gastrointestinal:  Negative for abdominal pain, blood in stool, constipation, diarrhea, heartburn, melena, nausea and vomiting.  Genitourinary:  Negative for dysuria, frequency and urgency.  Musculoskeletal:  Positive for back pain and joint pain.  Skin: Negative.  Negative for itching and rash.  Neurological:  Negative for dizziness, tingling, focal weakness, weakness and headaches.  Endo/Heme/Allergies:  Does not bruise/bleed easily.  Psychiatric/Behavioral:  Negative for depression. The patient is not nervous/anxious and does not have insomnia.     MEDICAL HISTORY:  Past Medical History:  Diagnosis Date    Anxiety    Breast cancer ([place]) 03/2018   right breast cancer at 11:00 and 1:00   Bursitis    bilateral hips and knees   Complication of anesthesia    Depression    Family history of breast cancer 03/24/2018   Family history of lung cancer 03/24/2018   History of alcohol dependence ([place]) 2007   no ETOH; resolved since 2007   History of hiatal hernia    History of psychosis 2014   due to sleep disturbance   Hypertension    Parkinson disease ([place])    Personal history of radiation therapy 06/2018-07/2018   right breast ca   PONV (postoperative nausea and vomiting)     SURGICAL HISTORY: Past Surgical History:  Procedure Laterality Date   BREAST BIOPSY Right 03/14/2018   11:00 DCIS and invasive ductal carcinoma   BREAST BIOPSY Right 03/14/2018   1:00 Invasive ductal carcinoma   BREAST LUMPECTOMY Right 04/27/2018   lumpectomy of 11 and 1:00 cancers, clear margins, negative LN   BREAST LUMPECTOMY WITH RADIOACTIVE SEED AND SENTINEL LYMPH NODE BIOPSY Right 04/27/2018   Procedure: RIGHT BREAST LUMPECTOMY WITH RADIOACTIVE SEED X 2 AND RIGHT SENTINEL LYMPH NODE BIOPSY;  Surgeon: [name], MD;  Location: [facility];  Service: General;  Laterality: Right;   CATARACT EXTRACTION W/PHACO Left 08/30/2019   Procedure: CATARACT EXTRACTION PHACO AND INTRAOCULAR LENS PLACEMENT ([place]) LEFT panoptix toric  01:20.5  12.7%  10.26;  Surgeon: Leandrew Koyanagi, MD;  Location: [facility];  Service: Ophthalmology;  Laterality: Left;   CATARACT EXTRACTION W/PHACO Right 09/20/2019   Procedure: CATARACT EXTRACTION PHACO AND INTRAOCULAR LENS PLACEMENT ([facility]) RIGHT 5.71  00:36.4  15.7%;  Surgeon: Leandrew Koyanagi, MD;  Location: [facility];  Service: Ophthalmology;  Laterality: Right;   LAPAROTOMY N/A 02/10/2018  Procedure: EXPLORATORY LAPAROTOMY;  Surgeon: Isabel Caprice, MD;  Location: [facility];  Service: Gynecology;  Laterality: N/A;   MASS EXCISION  01/2018    abdominal   OVARIAN CYST REMOVAL     SALPINGOOPHORECTOMY Bilateral 02/10/2018   Procedure: BILATERAL SALPINGO OOPHORECTOMY; PERITONEAL WASHINGS;  Surgeon: Isabel Caprice, MD;  Location: [facility];  Service: Gynecology;  Laterality: Bilateral;   TONSILLECTOMY     TONSILLECTOMY AND ADENOIDECTOMY  1953    SOCIAL HISTORY: Social History   Socioeconomic History   Marital status: Married    Spouse name: robert   Number of children: 0   Years of education: Not on file   Highest education level: Some college, no degree  Occupational History   Occupation: retired Furniture conservator/restorer of education  Tobacco Use   Smoking status: Never   Smokeless tobacco: Never  Scientific laboratory technician Use: Never used  Substance and Sexual Activity   Alcohol use: Not Currently    Comment: alcohol dependence prior to 2007   Drug use: Never   Sexual activity: Yes    Partners: Male    Birth control/protection: Surgical  Other Topics Concern   Not on file  Social History Narrative   Not on file   Social Determinants of Health   Financial Resource Strain: Not on file  Food Insecurity: Not on file  Transportation Needs: Not on file  Physical Activity: Not on file  Stress: Not on file  Social Connections: Not on file  Intimate Partner Violence: Not on file    FAMILY HISTORY: Family History  Problem Relation Age of Onset   Diabetes Mother    Breast cancer Mother 65   Hypertension Mother    Lung cancer Father        asbestos exposure   Heart disease Brother 90   Breast cancer Cousin 64       paternal cousin   Heart disease Paternal Grandmother 77   Heart disease Paternal Grandfather     ALLERGIES:  is allergic to hydroxyzine, other, and shrimp [shellfish allergy].  MEDICATIONS:  Current Outpatient Medications  Medication Sig Dispense Refill   Acetaminophen 500 MG capsule Take 1,000 mg by mouth every 6 (six) hours as needed.      carbidopa-levodopa (SINEMET CR) 50-200 MG tablet Take by  mouth 4 (four) times daily.     carbidopa-levodopa (SINEMET IR) 25-100 MG tablet Take 1 tablet by mouth at bedtime.      cyanocobalamin (,VITAMIN B-12,) 1000 MCG/ML injection INJECT 1 ML INTO THE MUSCLE EVERY 30 DAYS 3 mL 3   denosumab (PROLIA) 60 MG/ML SOSY injection Inject 60 mg into the skin every 6 (six) months. takes annually     diclofenac Sodium (VOLTAREN) 1 % GEL Apply 4 g topically 4 (four) times daily as needed. 100 g 0   FLUoxetine HCl 60 MG TABS TAKE ONE TABLET BY MOUTH DAILY 90 tablet 1   gabapentin (NEURONTIN) 100 MG capsule Take 3 capsules (300 mg total) by mouth 3 (three) times daily. 280 capsule 0   gabapentin (NEURONTIN) 100 MG capsule Take 1 capsule (100 mg total) by mouth at bedtime. 30 capsule 0   letrozole (FEMARA) 2.5 MG tablet Take 1 tablet (2.5 mg total) by mouth daily. 90 tablet 3   lisinopril-hydrochlorothiazide (ZESTORETIC) 20-12.5 MG tablet Take 1 tablet by mouth daily. 90 tablet 3   meloxicam (MOBIC) 15 MG tablet TAKE ONE TABLET BY MOUTH DAILY 30 tablet 0   NYAMYC powder  APPLY TOPICALLY THREE TIMES A DAY 15 g 1   polyethylene glycol (MIRALAX / GLYCOLAX) 17 g packet Take 17 g by mouth daily.     risperiDONE (RISPERDAL) 0.5 MG tablet Take 1-2 tablets (0.5-1 mg total) by mouth as directed. Take 1 tablet daily in the AM and 2 tablets at bedtime 270 tablet 1   traMADol (ULTRAM) 50 MG tablet Take by mouth.     traZODone (DESYREL) 100 MG tablet Take 1 tablet (100 mg total) by mouth at bedtime. 90 tablet 1   Current Facility-Administered Medications  Medication Dose Route Frequency Provider Last Rate Last Admin   lidocaine (PF) (XYLOCAINE) 1 % injection 4 mL  4 mL Intradermal Once Bacigalupo, Dionne Bucy, MD       lidocaine (PF) (XYLOCAINE) 1 % injection 4 mL  4 mL Intradermal Once Virginia Crews, MD          .  PHYSICAL EXAMINATION:  Vitals:   09/01/21 1013  BP: 131/81  Pulse: 76  Resp: 18  Temp: 97.8 F (36.6 C)  SpO2: 97%   Filed Weights   09/01/21  1013  Weight: 215 lb 4.8 oz (97.7 kg)    Physical Exam Vitals and nursing note reviewed.  HENT:     Head: Normocephalic and atraumatic.     Mouth/Throat:     Pharynx: Oropharynx is clear.  Eyes:     Extraocular Movements: Extraocular movements intact.     Pupils: Pupils are equal, round, and reactive to light.  Cardiovascular:     Rate and Rhythm: Normal rate and regular rhythm.  Pulmonary:     Comments: Decreased breath sounds bilaterally.  Abdominal:     Palpations: Abdomen is soft.  Musculoskeletal:        General: Normal range of motion.     Cervical back: Normal range of motion.  Skin:    General: Skin is warm.  Neurological:     General: No focal deficit present.     Mental Status: She is alert and oriented to person, place, and time.  Psychiatric:        Behavior: Behavior normal.        Judgment: Judgment normal.     LABORATORY DATA:  I have reviewed the data as listed Lab Results  Component Value Date   WBC 4.7 09/01/2021   HGB 12.9 09/01/2021   HCT 38.9 09/01/2021   MCV 86.4 09/01/2021   PLT 189 09/01/2021   Recent Labs    02/24/21 1028 09/01/21 1002  NA 136 136  K 4.0 3.6  CL 101 99  CO2 27 28  GLUCOSE 127* 124*  BUN 10 5*  CREATININE 0.85 0.69  CALCIUM 9.5 8.9  GFRNONAA >60 >60  PROT 6.7 6.7  ALBUMIN 3.8 4.0  AST 21 19  ALT 7 7  ALKPHOS 38 29*  BILITOT 0.3 0.7    RADIOGRAPHIC STUDIES: I have personally reviewed the radiological images as listed and agreed with the findings in the report. No results found.  Malignant neoplasm of upper-outer quadrant of right breast in female, estrogen receptor positive ([place]) # multi-focal stage IA right breast cancer s/p right lumpectomy on 04/27/2018.  Pathology of the right lateral lesion revealed a 2.5 cm grade II invasive ductal carcinoma with intermediate grade DCIS.  The medial lesion revealed a 1.1 cm grade II invasive ductal carcinoma with intermediate grade DCIS. LVIDS was present.  Margins were  negative.  Zero of 2 lymph nodes were positive. Both tumors were  ER + (100%), PR + (70%), and HER-2 negative.  Ki-67 was 10%.  Pathologic stage was mT2N0. Oncotype DX revealed a recurrence score of 12 which translated to a distant recurrence of 3% at 9 years with an AI or tamxifen alone.  The absolute benefit of chemotherpay was < 1%. S/p breast radiation from 06/30/2018 - 07/20/2018.  She began Femara on 09/06/2018.  She is tolerating it well.   # multilocular mucinous cystadenoma: s/p exploratory laparotomy, bilateral salpingo-oophrectomy, and pelvic washings on 02/10/2018. 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  Peritoneal washings revealed atypical cells.    #B12 deficiency [hx of perncious abemia]- Patient's cousin gives her B12 injections monthly.              # Osteopenia-  BMD- 06/12/2020; on Prolia- Labs today reviewed;  acceptable for treatment today.   #  PArkinson- /trermors/ gait sintabiliy- sinemet   # DISPOSITION: # prolia today #  RTC in 6 months for MD assessment, labs (CBC with diff, CMP, CA 27-29, CA-125) and +/- Prolia.-Dr.B      All questions were answered. The patient knows to call the clinic with any problems, questions or concerns.       Cammie Sickle, MD 09/01/2021 3:20 PM

## 2021-09-01 NOTE — Assessment & Plan Note (Addendum)
#  multi-focal stage IA right breast cancers/p right lumpectomy on 04/27/2018. Pathology of the right lateral lesionrevealed a 2.5 cm grade II invasive ductal carcinoma with intermediate grade DCIS. The medial lesionrevealed a 1.1 cm grade II invasive ductal carcinoma with intermediate grade DCIS. LVIDS was present. Margins were negative. Zero of 2 lymph nodes were positive. Both tumors were ER + (100%), PR + (70%), and HER-2 negative. Ki-67 was 10%. Pathologic stage was mT2N0. Oncotype DX revealed a recurrence score of 12 which translated to a distant recurrence of 3% at 9 years with an AI or tamxifen alone. The absolute benefit of chemotherpay was <1%. S/p breast radiationfrom 06/30/2018 - 07/20/2018. She began [facility] 09/06/2018. She is tolerating it well.  # multilocular mucinous cystadenoma: s/p exploratory laparotomy, bilateral salpingo-oophrectomy, and pelvic washings on 02/10/2018. 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 Peritoneal washings revealed atypical cells.   #B12 deficiency [hx of perncious abemia]- Patient's cousin gives her B12 injections monthly.   # Osteopenia-  BMD- 06/12/2020; on Prolia- Labs today reviewed;  acceptable for treatment today.   # PArkinson- /trermors/ gait sintabiliy- sinemet  # DISPOSITION: # prolia today #  RTC in 6 months for MD assessment, labs (CBC with diff, CMP, CA 27-29, CA-125) and +/- Prolia.-Dr.B

## 2021-09-02 LAB — CA 125: Cancer Antigen (CA) 125: 9.2 U/mL (ref 0.0–38.1)

## 2021-09-02 LAB — CANCER ANTIGEN 19-9: CA 19-9: 40 U/mL — ABNORMAL HIGH (ref 0–35)

## 2021-09-02 LAB — CANCER ANTIGEN 27.29: CA 27.29: 18.2 U/mL (ref 0.0–38.6)

## 2021-09-05 DIAGNOSIS — M48062 Spinal stenosis, lumbar region with neurogenic claudication: Secondary | ICD-10-CM | POA: Diagnosis not present

## 2021-09-05 DIAGNOSIS — M5136 Other intervertebral disc degeneration, lumbar region: Secondary | ICD-10-CM | POA: Diagnosis not present

## 2021-09-05 DIAGNOSIS — M5416 Radiculopathy, lumbar region: Secondary | ICD-10-CM | POA: Diagnosis not present

## 2021-09-06 ENCOUNTER — Other Ambulatory Visit: Payer: Self-pay | Admitting: Family Medicine

## 2021-09-06 NOTE — Telephone Encounter (Signed)
Requested Prescriptions  Pending Prescriptions Disp Refills  . meloxicam (MOBIC) 15 MG tablet [Pharmacy Med Name: MELOXICAM 15 MG TABLET] 30 tablet 0    Sig: TAKE ONE TABLET BY MOUTH DAILY     Analgesics:  COX2 Inhibitors Passed - 09/06/2021  6:50 AM      Passed - HGB in normal range and within 360 days    Hemoglobin  Date Value Ref Range Status  09/01/2021 12.9 12.0 - 15.0 g/dL Final  03/23/2018 13.0 11.6 - 15.9 g/dL Final         Passed - Cr in normal range and within 360 days    Creatinine  Date Value Ref Range Status  03/23/2018 0.89 0.60 - 1.10 mg/dL Final   Creatinine, Ser  Date Value Ref Range Status  09/01/2021 0.69 0.44 - 1.00 mg/dL Final         Passed - Patient is not pregnant      Passed - Valid encounter within last 12 months    Recent Outpatient Visits          3 weeks ago Essential hypertension   [place], Dionne Bucy, MD   2 months ago Parkinson disease [facility])   [facility] Tally Joe T, FNP   3 months ago Chronic bilateral low back pain, unspecified whether sciatica present   [facility], Dionne Bucy, MD   6 months ago Essential hypertension   [place], Dionne Bucy, MD   1 year ago Encounter for annual physical exam   [facility], Dionne Bucy, MD

## 2021-09-10 ENCOUNTER — Ambulatory Visit (INDEPENDENT_AMBULATORY_CARE_PROVIDER_SITE_OTHER): Payer: Medicare PPO | Admitting: Licensed Clinical Social Worker

## 2021-09-10 ENCOUNTER — Other Ambulatory Visit: Payer: Self-pay

## 2021-09-10 DIAGNOSIS — F411 Generalized anxiety disorder: Secondary | ICD-10-CM

## 2021-09-10 DIAGNOSIS — F3176 Bipolar disorder, in full remission, most recent episode depressed: Secondary | ICD-10-CM | POA: Diagnosis not present

## 2021-09-10 NOTE — Progress Notes (Signed)
   THERAPIST PROGRESS NOTE  Session Time: 25-0037C  Participation Level: Active  Behavioral Response: Neat and Well GroomedAlertAnxious  Type of Therapy: Individual Therapy  Treatment Goals addressed:  Problem: Reduce overall frequency, intensity, and duration of the anxiety so that daily functioning is not impaired Goal: LTG: Patient will score less than 5 on the Generalized Anxiety Disorder 7 Scale (GAD-7) Outcome: Progressing Goal: STG: Patient will participate in at least 80% of scheduled individual psychotherapy sessions Outcome: Progressing Interventions:  Intervention: Work with patient to identify the major components of a recent episode of anxiety: physical symptoms, major thoughts and images, and major behaviors they experienced Intervention: Assess emotional status and coping mechanisms Intervention: Assist with relaxation techniques, as appropriate (deep breathing exercises, meditation, guided imagery)   Summary: Kendra [name] is a 75 y.o. female who presents with improving symptoms related to bipolar disorder diagnosis. Pt reports that overall mood has been stable and that she has not felt many symptoms of depression recently. Pt reports that she is experiencing some anxiety, often triggered by uncertainty about a situation or social event. Pt reports fair quality and quantity of sleep--pt frequently waking at night.   Allowed pt to explore and express thoughts and feelings associated with recent life situations and external stressors. Discussed stress associated with husband leaving soon to go out of town for a week.  Reviewed some strategies with [name] that could help decrease anxiety when staying alone or in social situations that may be triggering. Pt reflects understanding.   Discussed relationship with husband--pt feels he is a big support system for pt and she is very thankful for his support.   Continued recommendations are as follows: self care behaviors, positive  social engagements, focusing on overall work/home/life balance, and focusing on positive physical and emotional wellness.   Suicidal/Homicidal: No  Therapist Response: Pt is continuing to apply interventions learned in session into daily life situations. Pt is currently on track to meet goals utilizing interventions mentioned above. Personal growth and progress noted. Treatment to continue as indicated.   Plan: Return again in 4 weeks.  Diagnosis: Axis I: Bipolar disorder, depression, in remission; GAD    Axis II: No diagnosis    [name] Rehanna Oloughlin, LCSW 09/10/2021

## 2021-09-10 NOTE — Plan of Care (Signed)
  Problem: Reduce overall frequency, intensity, and duration of the anxiety so that daily functioning is not impaired Goal: LTG: Patient will score less than 5 on the Generalized Anxiety Disorder 7 Scale (GAD-7) Outcome: Progressing Goal: STG: Patient will participate in at least 80% of scheduled individual psychotherapy sessions Outcome: Progressing Intervention: Work with patient to identify the major components of a recent episode of anxiety: physical symptoms, major thoughts and images, and major behaviors they experienced Intervention: Assess emotional status and coping mechanisms Intervention: Assist with relaxation techniques, as appropriate (deep breathing exercises, meditation, guided imagery)

## 2021-09-29 ENCOUNTER — Ambulatory Visit: Payer: Medicare PPO | Admitting: Pain Medicine

## 2021-09-30 ENCOUNTER — Telehealth: Payer: Self-pay

## 2021-09-30 ENCOUNTER — Other Ambulatory Visit: Payer: Self-pay | Admitting: Psychiatry

## 2021-09-30 ENCOUNTER — Telehealth: Payer: Self-pay | Admitting: Internal Medicine

## 2021-09-30 DIAGNOSIS — F411 Generalized anxiety disorder: Secondary | ICD-10-CM

## 2021-09-30 DIAGNOSIS — F5105 Insomnia due to other mental disorder: Secondary | ICD-10-CM

## 2021-09-30 NOTE — Telephone Encounter (Signed)
Copied from [place] [id number]. Topic: General - Other >> Sep 30, 2021  2:10 PM Leward Quan A wrote: Reason for CRM: Patient called in to inquire of Dr B if they may had discussed Lidocaine injections at some point because she had it noted on her calendar for 10/09/21 but she cant remember why. Patient would like a call back with an explanation or clarification from Dr B or her nurse please at Ph# [phone]

## 2021-09-30 NOTE — Telephone Encounter (Signed)
Unless there is something new she is already down for April of next year I dont understand message what do I need to do.

## 2021-10-02 NOTE — Telephone Encounter (Signed)
Left detailed message advising as below. 

## 2021-10-02 NOTE — Telephone Encounter (Signed)
We haven't discussed lidocaine injections, and I do not do these.  There is no appt for 12/8.  Looks like she is seeing me on 12/20 though.

## 2021-10-09 ENCOUNTER — Other Ambulatory Visit: Payer: Self-pay | Admitting: Family Medicine

## 2021-10-09 NOTE — Telephone Encounter (Signed)
Requested Prescriptions  Pending Prescriptions Disp Refills  . lisinopril-hydrochlorothiazide (ZESTORETIC) 20-12.5 MG tablet [Pharmacy Med Name: LISINOPRIL-HCTZ 20-12.5 MG TAB] 90 tablet 1    Sig: TAKE ONE TABLET BY MOUTH DAILY     Cardiovascular:  ACEI + Diuretic Combos Passed - 10/09/2021  6:21 AM      Passed - Na in normal range and within 180 days    Sodium  Date Value Ref Range Status  09/01/2021 136 135 - 145 mmol/L Final  02/22/2020 139 134 - 144 mmol/L Final         Passed - K in normal range and within 180 days    Potassium  Date Value Ref Range Status  09/01/2021 3.6 3.5 - 5.1 mmol/L Final         Passed - Cr in normal range and within 180 days    Creatinine  Date Value Ref Range Status  03/23/2018 0.89 0.60 - 1.10 mg/dL Final   Creatinine, Ser  Date Value Ref Range Status  09/01/2021 0.69 0.44 - 1.00 mg/dL Final         Passed - Ca in normal range and within 180 days    Calcium  Date Value Ref Range Status  09/01/2021 8.9 8.9 - 10.3 mg/dL Final         Passed - Patient is not pregnant      Passed - Last BP in normal range    BP Readings from Last 1 Encounters:  09/01/21 131/81         Passed - Valid encounter within last 6 months    Recent Outpatient Visits          1 month ago Essential hypertension   [place], Dionne Bucy, MD   3 months ago Parkinson disease [facility])   [facility] Tally Joe T, FNP   4 months ago Chronic bilateral low back pain, unspecified whether sciatica present   [facility], Dionne Bucy, MD   7 months ago Essential hypertension   [place], Dionne Bucy, MD   1 year ago Encounter for annual physical exam   [facility], Dionne Bucy, MD

## 2021-10-10 ENCOUNTER — Other Ambulatory Visit: Payer: Self-pay | Admitting: *Deleted

## 2021-10-10 DIAGNOSIS — C50411 Malignant neoplasm of upper-outer quadrant of right female breast: Secondary | ICD-10-CM

## 2021-10-10 MED ORDER — LETROZOLE 2.5 MG PO TABS: 2.5000 mg | ORAL_TABLET | Freq: Every day | ORAL | 1 refills | Status: DC

## 2021-10-20 NOTE — Progress Notes (Signed)
Annual Wellness Visit     Patient: Kendra [name], Female    DOB: [birth year], 75 y.o.   MRN: [id number] Visit Date: 10/21/2021  Today's Provider: Lavon Paganini, MD   Chief Complaint  Patient presents with   Medicare Wellness   Subjective    Kendra [name] is a 75 y.o. female who presents today for her Annual Wellness Visit. She reports consuming a general diet. The patient does not participate in regular exercise at present. She generally feels well. She reports sleeping fairly poor. She does not have additional problems to discuss today.   Trouble falling asleep and staying asleep. Ongoing for 6 months. Taking trazodone 100mg  qhs - now less efficacious.  HPI    Medications: Outpatient Medications Prior to Visit  Medication Sig   Acetaminophen 500 MG capsule Take 1,000 mg by mouth every 6 (six) hours as needed.    carbidopa-levodopa (SINEMET IR) 25-100 MG tablet Take 1 tablet by mouth at bedtime.    cyanocobalamin (,VITAMIN B-12,) 1000 MCG/ML injection INJECT 1 ML INTO THE MUSCLE EVERY 30 DAYS   denosumab (PROLIA) 60 MG/ML SOSY injection Inject 60 mg into the skin every 6 (six) months. takes annually   diclofenac Sodium (VOLTAREN) 1 % GEL Apply 4 g topically 4 (four) times daily as needed.   FLUoxetine HCl 60 MG TABS TAKE ONE TABLET BY MOUTH DAILY   gabapentin (NEURONTIN) 100 MG capsule Take 3 capsules (300 mg total) by mouth 3 (three) times daily.   gabapentin (NEURONTIN) 100 MG capsule Take 1 capsule (100 mg total) by mouth at bedtime.   letrozole (FEMARA) 2.5 MG tablet Take 1 tablet (2.5 mg total) by mouth daily.   lisinopril-hydrochlorothiazide (ZESTORETIC) 20-12.5 MG tablet TAKE ONE TABLET BY MOUTH DAILY   meloxicam (MOBIC) 15 MG tablet TAKE ONE TABLET BY MOUTH DAILY   NYAMYC powder APPLY TOPICALLY THREE TIMES A DAY   polyethylene glycol (MIRALAX / GLYCOLAX) 17 g packet Take 17 g by mouth daily.   risperiDONE (RISPERDAL) 0.5 MG tablet Take 1-2 tablets (0.5-1 mg  total) by mouth as directed. Take 1 tablet daily in the AM and 2 tablets at bedtime   traMADol (ULTRAM) 50 MG tablet Take by mouth.   traZODone (DESYREL) 100 MG tablet Take 1 tablet (100 mg total) by mouth at bedtime.   carbidopa-levodopa (SINEMET CR) 50-200 MG tablet Take by mouth 4 (four) times daily.   [DISCONTINUED] lidocaine (PF) (XYLOCAINE) 1 % injection 4 mL    [DISCONTINUED] lidocaine (PF) (XYLOCAINE) 1 % injection 4 mL    No facility-administered medications prior to visit.    Allergies  Allergen Reactions   Hydroxyzine Anaphylaxis    Tongue swollen   Other Other (See Comments)    Food poisoning    Shrimp [Shellfish Allergy] Other (See Comments)    Food poisoning     Patient Care Team: Virginia Crews, MD as PCP - General (Family Medicine) Lequita Asal, MD (Inactive) as Consulting Physician (Hematology and Oncology) Noreene Filbert, MD as Referring Physician (Radiation Oncology) Leandrew Koyanagi, MD as Referring Physician (Ophthalmology) Blythe Stanford, PT (Inactive) as Physical Therapist (Physical Therapy)  Review of Systems  Constitutional: Negative.   HENT:  Positive for ear pain, hearing loss and tinnitus.   Eyes:  Positive for photophobia.  Respiratory:  Positive for cough.   Cardiovascular:  Positive for leg swelling.  Gastrointestinal:  Positive for abdominal distention, abdominal pain and constipation.  Endocrine: Positive for polyuria.  Genitourinary:  Positive for enuresis.  Musculoskeletal:  Positive for arthralgias, back pain and joint swelling.  Skin:  Positive for color change and rash.  Allergic/Immunologic: Positive for food allergies.  Neurological:  Positive for light-headedness and headaches.  Hematological:  Positive for adenopathy. Bruises/bleeds easily.  Psychiatric/Behavioral:  Positive for sleep disturbance.    Last CBC Lab Results  Component Value Date   WBC 4.7 09/01/2021   HGB 12.9 09/01/2021   HCT 38.9 09/01/2021    MCV 86.4 09/01/2021   MCH 28.7 09/01/2021   RDW 13.8 09/01/2021   PLT 189 81/19/1478   Last metabolic panel Lab Results  Component Value Date   GLUCOSE 124 (H) 09/01/2021   NA 136 09/01/2021   K 3.6 09/01/2021   CL 99 09/01/2021   CO2 28 09/01/2021   BUN 5 (L) 09/01/2021   CREATININE 0.69 09/01/2021   GFRNONAA >60 09/01/2021   CALCIUM 8.9 09/01/2021   PROT 6.7 09/01/2021   ALBUMIN 4.0 09/01/2021   LABGLOB 1.7 07/13/2019   AGRATIO 2.5 (H) 07/13/2019   BILITOT 0.7 09/01/2021   ALKPHOS 29 (L) 09/01/2021   AST 19 09/01/2021   ALT 7 09/01/2021   ANIONGAP 9 09/01/2021   Last lipids Lab Results  Component Value Date   CHOL 185 02/27/2021   HDL 59 02/27/2021   LDLCALC 101 (H) 02/27/2021   TRIG 141 02/27/2021   CHOLHDL 3.1 02/27/2021   Last hemoglobin A1c Lab Results  Component Value Date   HGBA1C 5.4 04/13/2019   Last thyroid functions Lab Results  Component Value Date   TSH 2.31 04/13/2019        Objective    Vitals: BP 129/79 (BP Location: Left Arm, Patient Position: Sitting, Cuff Size: Large)    Pulse 77    Temp 98.7 F (37.1 C) (Oral)    Resp 16    Ht 5\' 6"  (1.676 m)    Wt 218 lb (98.9 kg)    SpO2 97%    BMI 35.19 kg/m  BP Readings from Last 3 Encounters:  10/21/21 129/79  09/01/21 131/81  08/12/21 138/73   Wt Readings from Last 3 Encounters:  10/21/21 218 lb (98.9 kg)  09/01/21 215 lb 4.8 oz (97.7 kg)  08/12/21 212 lb 3.2 oz (96.3 kg)      Physical Exam Vitals reviewed.  Constitutional:      General: She is not in acute distress.    Appearance: Normal appearance. She is well-developed. She is not diaphoretic.  HENT:     Head: Normocephalic and atraumatic.  Eyes:     General: No scleral icterus.    Conjunctiva/sclera: Conjunctivae normal.  Neck:     Thyroid: No thyromegaly.  Cardiovascular:     Rate and Rhythm: Normal rate and regular rhythm.     Pulses: Normal pulses.     Heart sounds: Normal heart sounds. No murmur heard. Pulmonary:      Effort: Pulmonary effort is normal. No respiratory distress.     Breath sounds: Normal breath sounds. No wheezing, rhonchi or rales.  Musculoskeletal:     Cervical back: Neck supple.     Right lower leg: No edema.     Left lower leg: No edema.  Lymphadenopathy:     Cervical: No cervical adenopathy.  Skin:    General: Skin is warm and dry.     Findings: No rash.  Neurological:     Mental Status: She is alert and oriented to person, place, and time. Mental status is at baseline.  Psychiatric:  Mood and Affect: Mood normal. Affect is flat.        Behavior: Behavior normal.     Most recent functional status assessment: In your present state of health, do you have any difficulty performing the following activities: 10/21/2021  Hearing? Y  Vision? N  Difficulty concentrating or making decisions? Y  Walking or climbing stairs? Y  Dressing or bathing? N  Doing errands, shopping? Y  Some recent data might be hidden   Most recent fall risk assessment: Fall Risk  10/21/2021  Falls in the past year? 1  Number falls in past yr: 0  Injury with Fall? 1  Risk for fall due to : History of fall(s);Impaired balance/gait  Risk for fall due to: Comment -  Follow up Falls evaluation completed;Education provided    Most recent depression screenings: PHQ 2/9 Scores 10/21/2021 08/12/2021  PHQ - 2 Score 2 1  PHQ- 9 Score 6 5  Some encounter information is confidential and restricted. Go to Review Flowsheets activity to see all data.   Most recent cognitive screening: 6CIT Screen 10/21/2021  What Year? 0 points  What month? 0 points  What time? 0 points  Count back from 20 2 points  Months in reverse 4 points  Repeat phrase 4 points  Total Score 10   Most recent Audit-C alcohol use screening Alcohol Use Disorder Test (AUDIT) 10/21/2021  1. How often do you have a drink containing alcohol? 0  2. How many drinks containing alcohol do you have on a typical day when you are drinking?  0  3. How often do you have six or more drinks on one occasion? 0  AUDIT-C Score 0  Alcohol Brief Interventions/Follow-up -   A score of 3 or more in women, and 4 or more in men indicates increased risk for alcohol abuse, EXCEPT if all of the points are from question 1   No results found for any visits on 10/21/21.  Assessment & Plan     Annual wellness visit done today including the all of the following: Reviewed patient's Family Medical History Reviewed and updated list of patient's medical providers Assessment of cognitive impairment was done Assessed patient's functional ability Established a written schedule for health screening [place] Completed and Reviewed  Exercise Activities and Dietary recommendations  Goals      LIFESTYLE - DECREASE FALLS RISK     Recommend to remove any items from the home that may cause slips or trips.        Immunization History  Administered Date(s) Administered   Fluad Quad(high Dose 65+) 07/19/2019, 08/29/2020, 08/12/2021   Pneumococcal Polysaccharide-23 08/29/2020   Unspecified SARS-COV-2 Vaccination 12/15/2019, 01/12/2020    Health Maintenance  Topic Date Due   TETANUS/TDAP  Never done   Zoster Vaccines- Shingrix (1 of 2) Never done   COVID-19 Vaccine (3 - Mixed Product risk series) 02/09/2020   Pneumonia Vaccine 40+ Years old (2 - PCV) 08/29/2021   MAMMOGRAM  06/17/2023   Fecal DNA (Cologuard)  11/07/2023   DEXA SCAN  06/12/2025   INFLUENZA VACCINE  Completed   Hepatitis C Screening  Completed   HPV VACCINES  Aged Out     Discussed health benefits of physical activity, and encouraged her to engage in regular exercise appropriate for her age and condition.    Problem List Items Addressed This Visit       Cardiovascular and Mediastinum   Essential hypertension    Well controlled Continue current medications  Reviewed recent metabolic panel        Other   Insomnia due to mental condition     Chronic Worsening recently  Continue trazodone On several sedating meds Trial of blue light blocking glasses before bedtime as she is watching evening TV before bedtime      Avitaminosis D   Relevant Orders   VITAMIN D 25 Hydroxy (Vit-D Deficiency, Fractures)   Obesity    Discussed importance of healthy weight management Discussed diet and exercise       Other hyperlipidemia    Reviewed last lipid panel Not currently on a statin Recheck FLP Discussed diet and exercise       Relevant Orders   Lipid panel   Other Visit Diagnoses     Encounter for annual wellness visit (AWV) in Medicare patient    -  Primary   Encounter for annual physical exam       Relevant Orders   Lipid panel   VITAMIN D 25 Hydroxy (Vit-D Deficiency, Fractures)   Hemoglobin A1c   Hyperglycemia       Relevant Orders   Hemoglobin A1c   Need for pneumococcal vaccination       Relevant Orders   Pneumococcal conjugate vaccine 20-valent (Prevnar 20)        Return in about 6 months (around 04/21/2022) for chronic disease f/u.     I, Lavon Paganini, MD, have reviewed all documentation for this visit. The documentation on 10/21/21 for the exam, diagnosis, procedures, and orders are all accurate and complete.   Sharnice Bosler, Dionne Bucy, MD, MPH [facility]

## 2021-10-21 ENCOUNTER — Other Ambulatory Visit: Payer: Self-pay

## 2021-10-21 ENCOUNTER — Encounter: Payer: Self-pay | Admitting: Family Medicine

## 2021-10-21 ENCOUNTER — Ambulatory Visit (INDEPENDENT_AMBULATORY_CARE_PROVIDER_SITE_OTHER): Payer: Medicare PPO | Admitting: Family Medicine

## 2021-10-21 VITALS — BP 129/79 | HR 77 | Temp 98.7°F | Resp 16 | Ht 66.0 in | Wt 218.0 lb

## 2021-10-21 DIAGNOSIS — E559 Vitamin D deficiency, unspecified: Secondary | ICD-10-CM

## 2021-10-21 DIAGNOSIS — R739 Hyperglycemia, unspecified: Secondary | ICD-10-CM

## 2021-10-21 DIAGNOSIS — Z23 Encounter for immunization: Secondary | ICD-10-CM

## 2021-10-21 DIAGNOSIS — I10 Essential (primary) hypertension: Secondary | ICD-10-CM | POA: Diagnosis not present

## 2021-10-21 DIAGNOSIS — Z0000 Encounter for general adult medical examination without abnormal findings: Secondary | ICD-10-CM

## 2021-10-21 DIAGNOSIS — E669 Obesity, unspecified: Secondary | ICD-10-CM | POA: Diagnosis not present

## 2021-10-21 DIAGNOSIS — Z6833 Body mass index (BMI) 33.0-33.9, adult: Secondary | ICD-10-CM

## 2021-10-21 DIAGNOSIS — E7849 Other hyperlipidemia: Secondary | ICD-10-CM | POA: Insufficient documentation

## 2021-10-21 DIAGNOSIS — F5105 Insomnia due to other mental disorder: Secondary | ICD-10-CM | POA: Diagnosis not present

## 2021-10-21 NOTE — Patient Instructions (Addendum)
Ask at the pharmacy about tetanus vaccine (TDAP) and shingles vaccine  The CDC recommends two doses of Shingrix (the shingles vaccine) separated by 2 to 6 months for adults age 74 years and older. I recommend checking with your insurance plan regarding coverage for this vaccine.    Call [facility] [phone] to see them after your positive cologuard test.

## 2021-10-21 NOTE — Assessment & Plan Note (Signed)
Well controlled Continue current medications Reviewed recent metabolic panel 

## 2021-10-21 NOTE — Assessment & Plan Note (Signed)
Chronic Worsening recently  Continue trazodone On several sedating meds Trial of blue light blocking glasses before bedtime as she is watching evening TV before bedtime

## 2021-10-21 NOTE — Assessment & Plan Note (Signed)
Reviewed last lipid panel Not currently on a statin Recheck FLP Discussed diet and exercise  

## 2021-10-21 NOTE — Assessment & Plan Note (Signed)
Discussed importance of healthy weight management Discussed diet and exercise  

## 2021-10-22 ENCOUNTER — Ambulatory Visit (INDEPENDENT_AMBULATORY_CARE_PROVIDER_SITE_OTHER): Payer: Medicare PPO | Admitting: Licensed Clinical Social Worker

## 2021-10-22 ENCOUNTER — Other Ambulatory Visit: Payer: Self-pay

## 2021-10-22 DIAGNOSIS — F3176 Bipolar disorder, in full remission, most recent episode depressed: Secondary | ICD-10-CM | POA: Diagnosis not present

## 2021-10-22 NOTE — Progress Notes (Signed)
Virtual Visit via Audio Note  I connected with Una Yeomans on 10/22/21 at 10:00 AM EST by an audio enabled telemedicine application and verified that I am speaking with the correct person using two identifiers.  Location: Patient: home Provider: remote office [place], [place])   I discussed the limitations of evaluation and management by telemedicine and the availability of in person appointments. The patient expressed understanding and agreed to proceed.  I discussed the assessment and treatment plan with the patient. The patient was provided an opportunity to ask questions and all were answered. The patient agreed with the plan and demonstrated an understanding of the instructions.   The patient was advised to call back or seek an in-person evaluation if the symptoms worsen or if the condition fails to improve as anticipated.  I provided 25 minutes of non-face-to-face time during this encounter.   Channah Godeaux R Joseth Weigel, LCSW   THERAPIST PROGRESS NOTE  Session Time: 10-1025a  Participation Level: Active  Behavioral Response: NAAlertAnxious and Depressed  Type of Therapy: Individual Therapy  Treatment Goals addressed:   Problem: Reduce overall frequency, intensity, and duration of the anxiety so that daily functioning is not impaired  Goal: LTG: Patient will score less than 5 on the Generalized Anxiety Disorder 7 Scale (GAD-7) Note: Pt reports that mood is stable and that she is anxious/stressed situationally--most recent triggered by upcoming colonoscopy  Goal: STG: Patient will participate in at least 80% of scheduled individual psychotherapy sessions Note: Pt on time and engaged throughout session  Interventions:  Intervention: Assess emotional status and coping mechanisms  Intervention: Encourage family support  Intervention: Assist with relaxation techniques, as appropriate (deep breathing exercises, meditation, guided imagery)   Summary: Zairah Arista is a 75  y.o. female who presents with continuing progress with managing symptoms of mood fluctuations and depression.   Allowed pt to explore and express thoughts and feelings associated with recent life situations and external stressors. Pt having a hard time putting thoughts into words--kept appt brief for pts overall comfort.  Pt reports that she has some anxiety/stress associated with upcoming colonoscopy appointment but not feeling a lot of stress or anxiety otherwise.   Reviewed managing depression symptoms and reviewed management of stress/anxiety symptoms.  Continued recommendations are as follows: self care behaviors, positive social engagements, focusing on overall work/home/life balance, and focusing on positive physical and emotional wellness.    Suicidal/Homicidal: No  Therapist Response: Pt is continuing to apply interventions learned in session into daily life situations. Pt is currently on track to meet goals utilizing interventions mentioned above. Personal growth and progress noted. Treatment to continue as indicated.     Plan: Return again in 4 weeks.  Diagnosis: Axis I: Bipolar disorder, depressed, in remission.    Axis II: No diagnosis    [name] Bethanne Mule, LCSW 10/22/2021

## 2021-10-22 NOTE — Plan of Care (Signed)
°  Problem: Reduce overall frequency, intensity, and duration of the anxiety so that daily functioning is not impaired Goal: LTG: Patient will score less than 5 on the Generalized Anxiety Disorder 7 Scale (GAD-7) Note: Pt reports that mood is stable and that she is anxious/stressed situationally--most recent triggered by upcoming colonoscopy Goal: STG: Patient will participate in at least 80% of scheduled individual psychotherapy sessions Note: Pt on time and engaged throughout session Intervention: Assess emotional status and coping mechanisms Intervention: Encourage family support Intervention: Assist with relaxation techniques, as appropriate (deep breathing exercises, meditation, guided imagery)

## 2021-10-24 ENCOUNTER — Ambulatory Visit: Payer: Medicare PPO | Admitting: Psychiatry

## 2021-11-17 DIAGNOSIS — G894 Chronic pain syndrome: Secondary | ICD-10-CM | POA: Insufficient documentation

## 2021-11-17 DIAGNOSIS — M899 Disorder of bone, unspecified: Secondary | ICD-10-CM | POA: Insufficient documentation

## 2021-11-17 DIAGNOSIS — Z79899 Other long term (current) drug therapy: Secondary | ICD-10-CM | POA: Insufficient documentation

## 2021-11-17 DIAGNOSIS — Z789 Other specified health status: Secondary | ICD-10-CM | POA: Insufficient documentation

## 2021-11-17 NOTE — Progress Notes (Deleted)
Patient: Kendra [name]  Service Category: E/M  Provider: Gaspar Cola, MD  DOB: 03/28/1946  DOS: 11/19/2021  Referring Provider: Virginia Crews, MD  MRN: [id number]  Setting: Ambulatory outpatient  PCP: Virginia Crews, MD  Type: New Patient  Specialty: Interventional Pain Management    Location: Office  Delivery: Face-to-face     Primary Reason(s) for Visit: Encounter for initial evaluation of one or more chronic problems (new to examiner) potentially causing chronic pain, and posing a threat to normal musculoskeletal function. (Level of risk: High) CC: No chief complaint on file.  HPI  Kendra [name] is a 76 y.o. year old, female patient, who comes for the first time to our practice referred by Virginia Crews, MD for our initial evaluation of her chronic pain. She has History of psychosis; History of insomnia; Malignant neoplasm of upper-outer quadrant of right breast in female, estrogen receptor positive ([place]); Malignant neoplasm of upper-inner quadrant of right breast in female, estrogen receptor positive ([place]); Genetic testing; Incisional hernia, without obstruction or gangrene; B12 deficiency; Adjustment disorder with anxious mood; Essential hypertension; High serum carbohydrate antigen 19-9 (CA19-9); MDD (major depressive disorder), severe ([place]); GAD (generalized anxiety disorder); Elevated tumor markers; Insomnia due to mental condition; Bipolar I disorder, most recent episode depressed ([place]); Alcohol use disorder, moderate, in sustained remission ([place]); Avitaminosis D; Bipolar disorder, in full remission, most recent episode depressed ([place]); Mucinous cystadenoma; Parkinson disease ([place]); Obesity; Osteopenia of neck of right femur; Elevated blood pressure reading; Chronic bilateral low back pain; Polypharmacy; Other hyperlipidemia; Chronic pain syndrome; Pharmacologic therapy; Disorder of skeletal system; and Problems influencing health status on their problem list. Today she  comes in for evaluation of her No chief complaint on file.  Pain Assessment: Location:     Radiating:   Onset:   Duration:   Quality:   Severity:  /10 (subjective, self-reported pain score)  Effect on ADL:   Timing:   Modifying factors:   BP:     HR:    Onset and Duration: {Hx; Onset and Duration:210120511} Cause of pain: {Hx; Cause:210120521} Severity: {Pain Severity:210120502} Timing: {Symptoms; Timing:210120501} Aggravating Factors: {Causes; Aggravating pain factors:210120507} Alleviating Factors: {Causes; Alleviating Factors:210120500} Associated Problems: {Hx; Associated problems:210120515} Quality of Pain: {Hx; Symptom quality or Descriptor:210120531} Previous Examinations or Tests: {Hx; Previous examinations or test:210120529} Previous Treatments: {Hx; Previous Treatment:210120503}  ***  Today I took the time to provide the patient with information regarding my pain practice. The patient was informed that my practice is divided into two sections: an interventional pain management section, as well as a completely separate and distinct medication management section. I explained that I have procedure days for my interventional therapies, and evaluation days for follow-ups and medication management. Because of the amount of documentation required during both, they are kept separated. This means that there is the possibility that she may be scheduled for a procedure on one day, and medication management the next. I have also informed her that because of staffing and facility limitations, I no longer take patients for medication management only. To illustrate the reasons for this, I gave the patient the example of surgeons, and how inappropriate it would be to refer a patient to his/her care, just to write for the post-surgical antibiotics on a surgery done by a different surgeon.   Because interventional pain management is my board-certified specialty, the patient was informed that  joining my practice means that they are open to any and all interventional therapies. I made it clear that  this does not mean that they will be forced to have any procedures done. What this means is that I believe interventional therapies to be essential part of the diagnosis and proper management of chronic pain conditions. Therefore, patients not interested in these interventional alternatives will be better served under the care of a different practitioner.  The patient was also made aware of my Comprehensive Pain Management Safety Guidelines where by joining my practice, they limit all of their nerve blocks and joint injections to those done by our practice, for as long as we are retained to manage their care.   Historic Controlled Substance Pharmacotherapy Review  PMP and historical list of controlled substances: ***  Current opioid analgesics:   *** MME/day: *** mg/day  Historical Monitoring: The patient  reports no history of drug use. List of all UDS Test(s): Lab Results  Component Value Date   MDMA NONE DETECTED 04/10/2019   MDMA NONE DETECTED 01/31/2019   COCAINSCRNUR NONE DETECTED 04/10/2019   COCAINSCRNUR NONE DETECTED 01/31/2019   PCPSCRNUR NONE DETECTED 04/10/2019   PCPSCRNUR NONE DETECTED 01/31/2019   THCU NONE DETECTED 04/10/2019   THCU NONE DETECTED 01/31/2019   ETH <10 04/10/2019   ETH <10 01/30/2019   List of other Serum/Urine Drug Screening Test(s):  Lab Results  Component Value Date   COCAINSCRNUR NONE DETECTED 04/10/2019   COCAINSCRNUR NONE DETECTED 01/31/2019   THCU NONE DETECTED 04/10/2019   THCU NONE DETECTED 01/31/2019   ETH <10 04/10/2019   ETH <10 01/30/2019   Historical Background Evaluation: [place] PMP: PDMP reviewed during this encounter. Online review of the past 40-month period conducted.             PMP NARX Score Report:  Narcotic: *** Sedative: *** Stimulant: *** [place] Department of public safety, offender search: Editor, commissioning Information)  Non-contributory Risk Assessment Profile: Aberrant behavior: None observed or detected today Risk factors for fatal opioid overdose: None identified today PMP NARX Overdose Risk Score: *** Fatal overdose hazard ratio (HR): Calculation deferred Non-fatal overdose hazard ratio (HR): Calculation deferred Risk of opioid abuse or dependence: 0.7-3.0% with doses ? 36 MME/day and 6.1-26% with doses ? 120 MME/day. Substance use disorder (SUD) risk level: See below Personal History of Substance Abuse (SUD-Substance use disorder):  Alcohol:    Illegal Drugs:    Rx Drugs:    ORT Risk Level calculation:    ORT Scoring interpretation table:  Score <3 = Low Risk for SUD  Score between 4-7 = Moderate Risk for SUD  Score >8 = High Risk for Opioid Abuse   PHQ-2 Depression Scale:  Total score:    PHQ-2 Scoring interpretation table: (Score and probability of major depressive disorder)  Score 0 = No depression  Score 1 = 15.4% Probability  Score 2 = 21.1% Probability  Score 3 = 38.4% Probability  Score 4 = 45.5% Probability  Score 5 = 56.4% Probability  Score 6 = 78.6% Probability   PHQ-9 Depression Scale:  Total score:    PHQ-9 Scoring interpretation table:  Score 0-4 = No depression  Score 5-9 = Mild depression  Score 10-14 = Moderate depression  Score 15-19 = Moderately severe depression  Score 20-27 = Severe depression (2.4 times higher risk of SUD and 2.89 times higher risk of overuse)   Pharmacologic Plan: As per protocol, I have not taken over any controlled substance management, pending the results of ordered tests and/or consults.            Initial impression: Pending  review of available data and ordered tests.  Meds   Current Outpatient Medications:    Acetaminophen 500 MG capsule, Take 1,000 mg by mouth every 6 (six) hours as needed. , Disp: , Rfl:    carbidopa-levodopa (SINEMET CR) 50-200 MG tablet, Take by mouth 4 (four) times daily., Disp: , Rfl:    carbidopa-levodopa  (SINEMET IR) 25-100 MG tablet, Take 1 tablet by mouth at bedtime. , Disp: , Rfl:    cyanocobalamin (,VITAMIN B-12,) 1000 MCG/ML injection, INJECT 1 ML INTO THE MUSCLE EVERY 30 DAYS, Disp: 3 mL, Rfl: 3   denosumab (PROLIA) 60 MG/ML SOSY injection, Inject 60 mg into the skin every 6 (six) months. takes annually, Disp: , Rfl:    diclofenac Sodium (VOLTAREN) 1 % GEL, Apply 4 g topically 4 (four) times daily as needed., Disp: 100 g, Rfl: 0   FLUoxetine HCl 60 MG TABS, TAKE ONE TABLET BY MOUTH DAILY, Disp: 90 tablet, Rfl: 1   gabapentin (NEURONTIN) 100 MG capsule, Take 3 capsules (300 mg total) by mouth 3 (three) times daily., Disp: 280 capsule, Rfl: 0   gabapentin (NEURONTIN) 100 MG capsule, Take 1 capsule (100 mg total) by mouth at bedtime., Disp: 30 capsule, Rfl: 0   letrozole (FEMARA) 2.5 MG tablet, Take 1 tablet (2.5 mg total) by mouth daily., Disp: 90 tablet, Rfl: 1   lisinopril-hydrochlorothiazide (ZESTORETIC) 20-12.5 MG tablet, TAKE ONE TABLET BY MOUTH DAILY, Disp: 90 tablet, Rfl: 1   meloxicam (MOBIC) 15 MG tablet, TAKE ONE TABLET BY MOUTH DAILY, Disp: 30 tablet, Rfl: 0   NYAMYC powder, APPLY TOPICALLY THREE TIMES A DAY, Disp: 15 g, Rfl: 1   polyethylene glycol (MIRALAX / GLYCOLAX) 17 g packet, Take 17 g by mouth daily., Disp: , Rfl:    risperiDONE (RISPERDAL) 0.5 MG tablet, Take 1-2 tablets (0.5-1 mg total) by mouth as directed. Take 1 tablet daily in the AM and 2 tablets at bedtime, Disp: 270 tablet, Rfl: 1   traMADol (ULTRAM) 50 MG tablet, Take by mouth., Disp: , Rfl:    traZODone (DESYREL) 100 MG tablet, Take 1 tablet (100 mg total) by mouth at bedtime., Disp: 90 tablet, Rfl: 1  Imaging Review  Shoulder Imaging: Shoulder-L DG: Results for orders placed during the hospital encounter of 11/28/19 DG Shoulder Left  Narrative CLINICAL DATA:  Left shoulder pain since a fall 11/23/2019. Initial encounter.  EXAM: LEFT SHOULDER - 2+ VIEW  COMPARISON:  None.  FINDINGS: There is no acute  bony or joint abnormality. Moderate acromioclavicular osteoarthritis is noted. The glenohumeral joint appears normal.  IMPRESSION: No acute abnormality.  Acromioclavicular osteoarthritis.   Electronically Signed By: Inge Rise M.D. On: 11/28/2019 16:36  Lumbosacral Imaging: Lumbar MR wo contrast: Results for orders placed during the hospital encounter of 01/31/20 MR LUMBAR SPINE WO CONTRAST  Narrative CLINICAL DATA:  Low back pain with bilateral radiculopathy  EXAM: MRI LUMBAR SPINE WITHOUT CONTRAST  TECHNIQUE: Multiplanar, multisequence MR imaging of the lumbar spine was performed. No intravenous contrast was administered.  COMPARISON:  11/22/2019  FINDINGS: Segmentation:  Standard.  Alignment:  5 mm grade on anterolisthesis L4 on L5.  Vertebrae: No fracture, evidence of discitis, or suspicious bone lesion. Scattered small intraosseous hemangiomas, most notable within the L2 and L4 vertebral bodies.  Conus medullaris and cauda equina: Conus extends to the T12-L1 level. Conus appears normal. There is bunching of the cauda equina nerve roots above the L2-3 level of stenosis.  Paraspinal and other soft tissues: Negative.  Disc levels:  T12-L1: Mild diffuse disc bulge. Mild right greater than left facet arthrosis. No foraminal or canal stenosis.  L1-L2: Mild diffuse disc bulge and mild bilateral facet arthrosis. No foraminal or canal stenosis.  L2-L3: Diffuse disc bulge with advanced bilateral facet arthrosis and ligamentum flavum buckling resulting in severe canal stenosis, severe bilateral subarticular recess stenosis. Moderate left and mild-to-moderate right foraminal stenosis.  L3-L4: Diffuse disc bulge with advanced bilateral facet arthrosis and ligamentum flavum buckling resulting in severe canal stenosis and moderate to severe bilateral subarticular recess stenosis. Foramina are patent.  L4-L5: Disc uncovering with advanced right worse than  left facet arthrosis resulting in mild to moderate canal stenosis and moderate to severe right foraminal stenosis and mild-to-moderate left foraminal stenosis. Severe bilateral subarticular recess stenosis.  L5-S1: Unremarkable disc. Bilateral facet arthrosis. No foraminal or canal stenosis.  IMPRESSION: 1. Advanced multilevel degenerative changes of the lumbar spine as described above, with severe L2-3 and L3-4 canal stenosis. 2. Multilevel bilateral foraminal stenosis, moderate-to-severe on the right at L4-5 and moderate on the left at L2-3.   Electronically Signed By: Davina Poke D.O. On: 01/31/2020 09:24  Lumbar DG (Complete) 4+V: Results for orders placed during the hospital encounter of 11/22/19 DG Lumbar Spine Complete  Narrative CLINICAL DATA:  Low back pain for several months with radiation into the left hip, initial encounter  EXAM: LUMBAR SPINE - COMPLETE 4+ VIEW  COMPARISON:  None.  FINDINGS: Five lumbar type vertebral bodies are well visualized. Vertebral body height is well maintained. No pars defects are seen. Facet hypertrophic changes are noted. Mild anterolisthesis of L4 on L5 is seen of a degenerative nature. Mild osteophytic changes are seen. Disc space narrowing is noted at L2-3. Surrounding soft tissue structures demonstrate considerable retained fecal material likely related to constipation.  IMPRESSION: Degenerative change with anterolisthesis as described.  Changes consistent with colonic constipation.   Electronically Signed By: Inez Catalina M.D. On: 11/22/2019 11:26  Hip Imaging: Hip-R DG 2-3 views: Results for orders placed during the hospital encounter of 11/22/19 DG Hip Unilat W OR W/O Pelvis 2-3 Views Right  Narrative CLINICAL DATA:  Right hip pain  EXAM: DG HIP (WITH OR WITHOUT PELVIS) 3V RIGHT  COMPARISON:  None.  FINDINGS: Pelvic ring is intact. Degenerative changes of the right hip joint are noted. No fracture or  dislocation is seen. Changes of colonic constipation are noted.  IMPRESSION: Degenerative change without acute abnormality.  Colonic constipation.   Electronically Signed By: Inez Catalina M.D. On: 11/22/2019 11:27  Hip-L DG 2-3 views: Results for orders placed during the hospital encounter of 11/28/19 DG Hip Unilat W OR W/O Pelvis 2-3 Views Left  Narrative CLINICAL DATA:  Left hip and upper leg pain since a fall 11/23/2019. Initial encounter.  EXAM: DG HIP (WITH OR WITHOUT PELVIS) 2-3V LEFT  COMPARISON:  None.  FINDINGS: There is no evidence of hip fracture or dislocation. There is no evidence of arthropathy or other focal bone abnormality.  IMPRESSION: Negative exam.   Electronically Signed By: Inge Rise M.D. On: 11/28/2019 16:45  Knee Imaging: Knee-L DG 4 views: Results for orders placed during the hospital encounter of 11/28/19 DG Knee Complete 4 Views Left  Narrative CLINICAL DATA:  Left knee pain since a fall 11/23/2019.  EXAM: LEFT KNEE - COMPLETE 4+ VIEW  COMPARISON:  None.  FINDINGS: No evidence of fracture, dislocation, or joint effusion. No evidence of arthropathy or other focal bone abnormality. Soft tissues are unremarkable.  IMPRESSION: Normal exam.  Electronically Signed By: Inge Rise M.D. On: 11/28/2019 16:42  Elbow Imaging: Elbow-L DG Complete: Results for orders placed during the hospital encounter of 11/28/19 DG Elbow Complete Left  Narrative CLINICAL DATA:  Left elbow pain since the patient suffered a fall 11/23/2019. Initial encounter.  EXAM: LEFT ELBOW - COMPLETE 3+ VIEW  COMPARISON:  None.  FINDINGS: The patient has an acute mildly impacted fracture through the lateral aspect of the radial head extending into the neck. No other acute bony abnormality is seen. Small elbow joint effusion is noted.  IMPRESSION: Acute, mildly impacted fracture through the lateral aspect of the radial head extends into  the neck of the radius.   Electronically Signed By: Inge Rise M.D. On: 11/28/2019 16:39  Wrist Imaging: Wrist-L DG Complete: Results for orders placed during the hospital encounter of 11/28/19 DG Wrist Complete Left  Narrative CLINICAL DATA:  Left wrist pain since a fall 11/23/2019. Initial encounter.  EXAM: LEFT WRIST - COMPLETE 3+ VIEW  COMPARISON:  None.  FINDINGS: The patient has a remote healed impaction fracture of the distal radius. There is also a remote ulnar styloid fracture. No acute abnormality is identified. First CMC osteoarthritis is noted.  IMPRESSION: No acute abnormality.  Remote healed fracture of the distal metaphysis of the radius. Remote ulnar styloid fracture also noted.  First CMC osteoarthritis.   Electronically Signed By: Inge Rise M.D. On: 11/28/2019 16:41  Complexity Note: Imaging results reviewed. Results shared with Ms. Desouza, using Layman's terms.                        ROS  Cardiovascular: {Hx; Cardiovascular History:210120525} Pulmonary or Respiratory: {Hx; Pumonary and/or Respiratory History:210120523} Neurological: {Hx; Neurological:210120504} Psychological-Psychiatric: {Hx; Psychological-Psychiatric History:210120512} Gastrointestinal: {Hx; Gastrointestinal:210120527} Genitourinary: {Hx; Genitourinary:210120506} Hematological: {Hx; Hematological:210120510} Endocrine: {Hx; Endocrine history:210120509} Rheumatologic: {Hx; Rheumatological:210120530} Musculoskeletal: {Hx; Musculoskeletal:210120528} Work History: {Hx; Work history:210120514}  Allergies  Ms. Ogarro is allergic to hydroxyzine, other, and shrimp [shellfish allergy].  Laboratory Chemistry Profile   Renal Lab Results  Component Value Date   BUN 5 (L) 09/01/2021   CREATININE 0.69 09/01/2021   BCR 8 (L) 02/22/2020   GFRAA >60 04/29/2020   GFRNONAA >60 09/01/2021   SPECGRAV 1.025 03/18/2020   PHUR 7.5 03/18/2020   PROTEINUR Negative  03/18/2020     Electrolytes Lab Results  Component Value Date   NA 136 09/01/2021   K 3.6 09/01/2021   CL 99 09/01/2021   CALCIUM 8.9 09/01/2021     Hepatic Lab Results  Component Value Date   AST 19 09/01/2021   ALT 7 09/01/2021   ALBUMIN 4.0 09/01/2021   ALKPHOS 29 (L) 09/01/2021     ID Lab Results  Component Value Date   SARSCOV2NAA NEGATIVE 09/15/2019   HCVAB <0.1 06/01/2019     Bone Lab Results  Component Value Date   VD25OH 29.4 (L) 02/27/2021   TESTOSTERONE 39 06/20/2015     Endocrine Lab Results  Component Value Date   GLUCOSE 124 (H) 09/01/2021   GLUCOSEU NEGATIVE 04/10/2019   HGBA1C 5.4 04/13/2019   TSH 2.31 04/13/2019   TESTOSTERONE 39 06/20/2015     Neuropathy Lab Results  Component Value Date   VITAMINB12 428 09/01/2021   FOLATE 7.2 04/29/2020   HGBA1C 5.4 04/13/2019     CNS No results found for: COLORCSF, APPEARCSF, RBCCOUNTCSF, WBCCSF, POLYSCSF, LYMPHSCSF, EOSCSF, PROTEINCSF, GLUCCSF, JCVIRUS, CSFOLI, IGGCSF, LABACHR, ACETBL, LABACHR, ACETBL   Inflammation (CRP: Acute   ESR: Chronic) No  results found for: CRP, ESRSEDRATE, LATICACIDVEN   Rheumatology No results found for: RF, ANA, LABURIC, URICUR, LYMEIGGIGMAB, LYMEABIGMQN, HLAB27   Coagulation Lab Results  Component Value Date   PLT 189 09/01/2021     Cardiovascular Lab Results  Component Value Date   TROPONINI <0.03 07/25/2018   HGB 12.9 09/01/2021   HCT 38.9 09/01/2021     Screening Lab Results  Component Value Date   SARSCOV2NAA NEGATIVE 09/15/2019   COVIDSOURCE NASOPHARYNGEAL 04/10/2019   HCVAB <0.1 06/01/2019     Cancer No results found for: [name], CA125, LABCA2   Allergens No results found for: ALMOND, APPLE, ASPARAGUS, AVOCADO, BANANA, BARLEY, BASIL, BAYLEAF, GREENBEAN, LIMABEAN, WHITEBEAN, BEEFIGE, REDBEET, BLUEBERRY, BROCCOLI, CABBAGE, MELON, CARROT, CASEIN, CASHEWNUT, CAULIFLOWER, CELERY     Note: Lab results reviewed.  [place]  Drug: Ms. Fahey  reports no  history of drug use. Alcohol:  reports that she does not currently use alcohol. Tobacco:  reports that she has never smoked. She has never used smokeless tobacco. Medical:  has a past medical history of Anxiety, Breast cancer ([place]) (03/2018), Bursitis, Complication of anesthesia, Depression, Family history of breast cancer (03/24/2018), Family history of lung cancer (03/24/2018), History of alcohol dependence ([place]) (2007), History of hiatal hernia, History of psychosis (2014), Hypertension, Parkinson disease ([place]), Personal history of radiation therapy (06/2018-07/2018), and PONV (postoperative nausea and vomiting). Family: family history includes Breast cancer (age of onset: 19) in her cousin; Breast cancer (age of onset: 78) in her mother; Diabetes in her mother; Heart disease in her paternal grandfather; Heart disease (age of onset: 57) in her brother; Heart disease (age of onset: 81) in her paternal grandmother; Hypertension in her mother; Lung cancer in her father.  Past Surgical History:  Procedure Laterality Date   BREAST BIOPSY Right 03/14/2018   11:00 DCIS and invasive ductal carcinoma   BREAST BIOPSY Right 03/14/2018   1:00 Invasive ductal carcinoma   BREAST LUMPECTOMY Right 04/27/2018   lumpectomy of 11 and 1:00 cancers, clear margins, negative LN   BREAST LUMPECTOMY WITH RADIOACTIVE SEED AND SENTINEL LYMPH NODE BIOPSY Right 04/27/2018   Procedure: RIGHT BREAST LUMPECTOMY WITH RADIOACTIVE SEED X 2 AND RIGHT SENTINEL LYMPH NODE BIOPSY;  Surgeon: [name], MD;  Location: [facility];  Service: General;  Laterality: Right;   CATARACT EXTRACTION W/PHACO Left 08/30/2019   Procedure: CATARACT EXTRACTION PHACO AND INTRAOCULAR LENS PLACEMENT ([place]) LEFT panoptix toric  01:20.5  12.7%  10.26;  Surgeon: Leandrew Koyanagi, MD;  Location: [facility];  Service: Ophthalmology;  Laterality: Left;   CATARACT EXTRACTION W/PHACO Right 09/20/2019   Procedure: CATARACT  EXTRACTION PHACO AND INTRAOCULAR LENS PLACEMENT ([facility]) RIGHT 5.71  00:36.4  15.7%;  Surgeon: Leandrew Koyanagi, MD;  Location: [facility];  Service: Ophthalmology;  Laterality: Right;   LAPAROTOMY N/A 02/10/2018   Procedure: EXPLORATORY LAPAROTOMY;  Surgeon: Isabel Caprice, MD;  Location: [facility];  Service: Gynecology;  Laterality: N/A;   MASS EXCISION  01/2018   abdominal   OVARIAN CYST REMOVAL     SALPINGOOPHORECTOMY Bilateral 02/10/2018   Procedure: BILATERAL SALPINGO OOPHORECTOMY; PERITONEAL WASHINGS;  Surgeon: Isabel Caprice, MD;  Location: [facility];  Service: Gynecology;  Laterality: Bilateral;   TONSILLECTOMY     TONSILLECTOMY AND ADENOIDECTOMY  1953   Active Ambulatory Problems    Diagnosis Date Noted   History of psychosis 02/07/2018   History of insomnia 02/07/2018   Malignant neoplasm of upper-outer quadrant of right breast in female, estrogen receptor positive ([place]) 03/17/2018  Malignant neoplasm of upper-inner quadrant of right breast in female, estrogen receptor positive ([place]) 03/17/2018   Genetic testing 04/07/2018   Incisional hernia, without obstruction or gangrene 09/12/2018   B12 deficiency 10/17/2018   Adjustment disorder with anxious mood 11/24/2018   Essential hypertension 11/24/2018   High serum carbohydrate antigen 19-9 (CA19-9) 12/01/2018   MDD (major depressive disorder), severe ([place]) 01/31/2019   GAD (generalized anxiety disorder) 04/10/2019   Elevated tumor markers 05/27/2019   Insomnia due to mental condition 05/31/2019   Bipolar I disorder, most recent episode depressed ([place]) 05/31/2019   Alcohol use disorder, moderate, in sustained remission ([place]) 05/31/2019   Avitaminosis D 08/24/2019   Bipolar disorder, in full remission, most recent episode depressed ([place]) 11/08/2019   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   Parkinson disease ([place]) 02/22/2020   Obesity 08/29/2020   Osteopenia of neck of right femur 09/06/2020   Elevated blood pressure reading  05/23/2021   Chronic bilateral low back pain 06/18/2021   Polypharmacy 08/12/2021   Other hyperlipidemia 10/21/2021   Chronic pain syndrome 11/17/2021   Pharmacologic therapy 11/17/2021   Disorder of skeletal system 11/17/2021   Problems influencing health status 11/17/2021   Resolved Ambulatory Problems    Diagnosis Date Noted   Pelvic mass in female 02/07/2018   Breast mass, right 02/07/2018   History of alcohol dependence ([place]) 02/07/2018   Family history of breast cancer 03/24/2018   Family history of lung cancer 03/24/2018   Moderate recurrent major depression ([place]) 07/25/2018   Elevated LFTs 10/17/2018   Screening for lipid disorders 11/24/2018   Altered mental status 04/10/2019   Cognitive disorder 05/31/2019   Candidal skin infection 04/29/2020   Past Medical History:  Diagnosis Date   Anxiety    Breast cancer ([place]) 03/2018   Bursitis    Complication of anesthesia    Depression    History of hiatal hernia    Hypertension    Personal history of radiation therapy 06/2018-07/2018   PONV (postoperative nausea and vomiting)    Constitutional Exam  General appearance: Well nourished, well developed, and well hydrated. In no apparent acute distress There were no vitals filed for this visit. BMI Assessment: Estimated body mass index is 35.19 kg/m as calculated from the following:   Height as of 10/21/21: $RemoveBefo'5\' 6"'BogryFCYVCm$  (1.676 m).   Weight as of 10/21/21: 218 lb (98.9 kg).  BMI interpretation table: BMI level Category Range association with higher incidence of chronic pain  <18 kg/m2 Underweight   18.5-24.9 kg/m2 Ideal body weight   25-29.9 kg/m2 Overweight Increased incidence by 20%  30-34.9 kg/m2 Obese (Class I) Increased incidence by 68%  35-39.9 kg/m2 Severe obesity (Class II) Increased incidence by 136%  >40 kg/m2 Extreme obesity (Class III) Increased incidence by 254%   Patient's current BMI Ideal Body weight  There is no height or weight on file to calculate BMI.  Patient weight not recorded   BMI Readings from Last 4 Encounters:  10/21/21 35.19 kg/m  09/01/21 34.75 kg/m  08/12/21 34.25 kg/m  06/18/21 34.30 kg/m   Wt Readings from Last 4 Encounters:  10/21/21 218 lb (98.9 kg)  09/01/21 215 lb 4.8 oz (97.7 kg)  08/12/21 212 lb 3.2 oz (96.3 kg)  06/18/21 212 lb 8 oz (96.4 kg)    Psych/Mental status: Alert, oriented x 3 (person, place, & time)       Eyes: PERLA Respiratory: No evidence of acute respiratory distress  Assessment  Primary Diagnosis & Pertinent Problem List: The primary encounter diagnosis  was Chronic pain syndrome. Diagnoses of Pharmacologic therapy, Disorder of skeletal system, and Problems influencing health status were also pertinent to this visit.  Visit Diagnosis (New problems to examiner): 1. Chronic pain syndrome   2. Pharmacologic therapy   3. Disorder of skeletal system   4. Problems influencing health status    Plan of Care (Initial workup plan)  Note: Ms. Ewer was reminded that as per protocol, today's visit has been an evaluation only. We have not taken over the patient's controlled substance management.  Problem-specific plan: No problem-specific Assessment & Plan notes found for this encounter.  Lab Orders  No laboratory test(s) ordered today   Imaging Orders  No imaging studies ordered today   Referral Orders  No referral(s) requested today   Procedure Orders    No procedure(s) ordered today   Pharmacotherapy (current): Medications ordered:  No orders of the defined types were placed in this encounter.  Medications administered during this visit: Aulani Bondarenko had no medications administered during this visit.   Pharmacological management options:  Opioid Analgesics: The patient was informed that there is no guarantee that she would be a candidate for opioid analgesics. The decision will be made following CDC guidelines. This decision will be based on the results of diagnostic studies,  as well as Ms. Willden's risk profile.   Membrane stabilizer: To be determined at a later time  Muscle relaxant: To be determined at a later time  NSAID: To be determined at a later time  Other analgesic(s): To be determined at a later time   Interventional management options: Ms. Montroy was informed that there is no guarantee that she would be a candidate for interventional therapies. The decision will be based on the results of diagnostic studies, as well as Ms. Strassman's risk profile.  Procedure(s) under consideration:  Pending results of ordered studies      Interventional Therapies  Risk   Complexity Considerations:   Estimated body mass index is 35.19 kg/m as calculated from the following:   Height as of 10/21/21: $RemoveBefo'5\' 6"'OkGhDhWsmvE$  (1.676 m).   Weight as of 10/21/21: 218 lb (98.9 kg). WNL   Planned   Pending:   Pending further evaluation   Under consideration:   ***   Completed:   None at this time   Therapeutic   Palliative (PRN) options:   None established      Provider-requested follow-up: No follow-ups on file.  Future Appointments  Date Time Provider [place]  11/19/2021  9:00 AM Milinda Pointer, MD [facility] None  12/01/2021 10:00 AM Ursula Alert, MD [facility] None  12/02/2021 11:00 AM Hussami, Christina R, LCSW [facility] None  01/29/2022 10:30 AM Noreene Filbert, MD [facility] None  02/27/2022 10:00 AM [facility] None  02/27/2022 10:30 AM Cammie Sickle, MD [facility] None  02/27/2022 11:00 AM [facility] INJECTION [facility] None  04/20/2022 10:40 AM Bacigalupo, Dionne Bucy, MD [facility]    Note by: Gaspar Cola, MD Date: 11/19/2021; Time: 9:13 AM

## 2021-11-19 ENCOUNTER — Ambulatory Visit: Payer: Medicare PPO | Admitting: Pain Medicine

## 2021-12-01 ENCOUNTER — Ambulatory Visit (INDEPENDENT_AMBULATORY_CARE_PROVIDER_SITE_OTHER): Payer: Medicare PPO | Admitting: Psychiatry

## 2021-12-01 ENCOUNTER — Other Ambulatory Visit: Payer: Self-pay

## 2021-12-01 ENCOUNTER — Encounter: Payer: Self-pay | Admitting: Psychiatry

## 2021-12-01 VITALS — BP 150/62 | HR 93 | Temp 97.8°F | Wt 220.2 lb

## 2021-12-01 DIAGNOSIS — R030 Elevated blood-pressure reading, without diagnosis of hypertension: Secondary | ICD-10-CM | POA: Diagnosis not present

## 2021-12-01 DIAGNOSIS — F3176 Bipolar disorder, in full remission, most recent episode depressed: Secondary | ICD-10-CM | POA: Diagnosis not present

## 2021-12-01 DIAGNOSIS — F411 Generalized anxiety disorder: Secondary | ICD-10-CM | POA: Diagnosis not present

## 2021-12-01 DIAGNOSIS — Z79899 Other long term (current) drug therapy: Secondary | ICD-10-CM

## 2021-12-01 DIAGNOSIS — F5105 Insomnia due to other mental disorder: Secondary | ICD-10-CM | POA: Diagnosis not present

## 2021-12-01 NOTE — Progress Notes (Signed)
[facility] MD OP Progress Note  12/01/2021 2:01 PM Kendra [name]  MRN:  [id number]  Chief Complaint:  Chief Complaint   Follow-up; 76 year old Caucasian female who has a history of bipolar disorder, GAD, insomnia, presents for medication management.    HPI: Kendra [name] is a 76 year old Caucasian female, married, lives in [place], has a history of bipolar disorder, GAD, insomnia, multifocal stage I right breast cancer status post treatment-lumpectomy, ovarian cyst removal, Parkinson's disease, bilateral hearing loss was evaluated in office today.  Collateral information obtained from husband-[name].  Patient today reports she is currently doing fairly well.  Denies any significant mood swings, anxiety symptoms.  Patient reports sleep is overall okay and she takes trazodone as needed.  She also has been following a good sleep hygiene.  Patient is trying to stay active.  Patient is aware of her Parkinson's disease diagnosis and continues to stay on her carbidopa levodopa.  Reports she is currently doing fairly well.  Patient denies any suicidality, homicidality or perceptual disturbances.  Denies side effects to medications.  Per husband patient is currently doing well.  He does try to support her otherwise let her be independent with taking care of her needs.  Denies any concerns today.  Visit Diagnosis:    ICD-10-CM   1. Bipolar disorder, in full remission, most recent episode depressed ([place])  F31.76 Prolactin    2. GAD (generalized anxiety disorder)  F41.1     3. Insomnia due to mental condition  F51.05    mood    4. Elevated blood pressure reading  R03.0     5. High risk medication use  Z79.899 Prolactin      Past Psychiatric History: Reviewed past psychiatric history from progress note on 03/01/2019.  Past trials of risperidone, Prozac, Seroquel, Ativan.  Past Medical History:  Past Medical History:  Diagnosis Date   Anxiety    Breast cancer ([place]) 03/2018   right breast  cancer at 11:00 and 1:00   Bursitis    bilateral hips and knees   Complication of anesthesia    Depression    Family history of breast cancer 03/24/2018   Family history of lung cancer 03/24/2018   History of alcohol dependence ([place]) 2007   no ETOH; resolved since 2007   History of hiatal hernia    History of psychosis 2014   due to sleep disturbance   Hypertension    Parkinson disease ([place])    Personal history of radiation therapy 06/2018-07/2018   right breast ca   PONV (postoperative nausea and vomiting)     Past Surgical History:  Procedure Laterality Date   BREAST BIOPSY Right 03/14/2018   11:00 DCIS and invasive ductal carcinoma   BREAST BIOPSY Right 03/14/2018   1:00 Invasive ductal carcinoma   BREAST LUMPECTOMY Right 04/27/2018   lumpectomy of 11 and 1:00 cancers, clear margins, negative LN   BREAST LUMPECTOMY WITH RADIOACTIVE SEED AND SENTINEL LYMPH NODE BIOPSY Right 04/27/2018   Procedure: RIGHT BREAST LUMPECTOMY WITH RADIOACTIVE SEED X 2 AND RIGHT SENTINEL LYMPH NODE BIOPSY;  Surgeon: [name], MD;  Location: [facility];  Service: General;  Laterality: Right;   CATARACT EXTRACTION W/PHACO Left 08/30/2019   Procedure: CATARACT EXTRACTION PHACO AND INTRAOCULAR LENS PLACEMENT ([place]) LEFT panoptix toric  01:20.5  12.7%  10.26;  Surgeon: Leandrew Koyanagi, MD;  Location: [facility];  Service: Ophthalmology;  Laterality: Left;   CATARACT EXTRACTION W/PHACO Right 09/20/2019   Procedure: CATARACT EXTRACTION PHACO AND INTRAOCULAR LENS PLACEMENT (  [facility]) RIGHT 5.71  00:36.4  15.7%;  Surgeon: Leandrew Koyanagi, MD;  Location: [facility];  Service: Ophthalmology;  Laterality: Right;   LAPAROTOMY N/A 02/10/2018   Procedure: EXPLORATORY LAPAROTOMY;  Surgeon: Isabel Caprice, MD;  Location: [facility];  Service: Gynecology;  Laterality: N/A;   MASS EXCISION  01/2018   abdominal   OVARIAN CYST REMOVAL     SALPINGOOPHORECTOMY Bilateral 02/10/2018    Procedure: BILATERAL SALPINGO OOPHORECTOMY; PERITONEAL WASHINGS;  Surgeon: Isabel Caprice, MD;  Location: [facility];  Service: Gynecology;  Laterality: Bilateral;   TONSILLECTOMY     TONSILLECTOMY AND ADENOIDECTOMY  1953    Family Psychiatric History: Reviewed family psychiatric history from progress note on 03/01/2019.  Family History:  Family History  Problem Relation Age of Onset   Diabetes Mother    Breast cancer Mother 41   Hypertension Mother    Lung cancer Father        asbestos exposure   Heart disease Brother 45   Breast cancer Cousin 24       paternal cousin   Heart disease Paternal Grandmother 24   Heart disease Paternal Grandfather     Social History: Reviewed social history from progress note on 03/01/2019. Social History   Socioeconomic History   Marital status: Married    Spouse name: robert   Number of children: 0   Years of education: Not on file   Highest education level: Some college, no degree  Occupational History   Occupation: retired Furniture conservator/restorer of education  Tobacco Use   Smoking status: Never   Smokeless tobacco: Never  Scientific laboratory technician Use: Never used  Substance and Sexual Activity   Alcohol use: Not Currently    Comment: alcohol dependence prior to 2007   Drug use: Never   Sexual activity: Yes    Partners: Male    Birth control/protection: Surgical  Other Topics Concern   Not on file  Social History Narrative   Not on file   Social Determinants of Health   Financial Resource Strain: Not on file  Food Insecurity: Not on file  Transportation Needs: Not on file  Physical Activity: Not on file  Stress: Not on file  Social Connections: Not on file    Allergies:  Allergies  Allergen Reactions   Hydroxyzine Anaphylaxis    Tongue swollen   Other Other (See Comments)    Food poisoning    Shrimp [Shellfish Allergy] Other (See Comments)    Food poisoning     Metabolic Disorder Labs: Lab Results  Component Value  Date   HGBA1C 5.4 04/13/2019   Lab Results  Component Value Date   PROLACTIN 101.9 06/20/2015   Lab Results  Component Value Date   CHOL 185 02/27/2021   TRIG 141 02/27/2021   HDL 59 02/27/2021   CHOLHDL 3.1 02/27/2021   LDLCALC 101 (H) 02/27/2021   LDLCALC 95 04/13/2019   Lab Results  Component Value Date   TSH 2.31 04/13/2019   TSH 2.543 07/25/2018    Therapeutic Level Labs: No results found for: LITHIUM No results found for: VALPROATE No components found for:  CBMZ  Current Medications: Current Outpatient Medications  Medication Sig Dispense Refill   Acetaminophen 500 MG capsule Take 1,000 mg by mouth every 6 (six) hours as needed.      carbidopa-levodopa (SINEMET IR) 25-100 MG tablet Take 1 tablet by mouth at bedtime.      cyanocobalamin (,VITAMIN B-12,) 1000 MCG/ML injection INJECT  1 ML INTO THE MUSCLE EVERY 30 DAYS 3 mL 3   denosumab (PROLIA) 60 MG/ML SOSY injection Inject 60 mg into the skin every 6 (six) months. takes annually     diclofenac Sodium (VOLTAREN) 1 % GEL Apply 4 g topically 4 (four) times daily as needed. 100 g 0   FLUoxetine HCl 60 MG TABS TAKE ONE TABLET BY MOUTH DAILY 90 tablet 1   gabapentin (NEURONTIN) 100 MG capsule Take 3 capsules (300 mg total) by mouth 3 (three) times daily. 280 capsule 0   gabapentin (NEURONTIN) 100 MG capsule Take 1 capsule (100 mg total) by mouth at bedtime. 30 capsule 0   letrozole (FEMARA) 2.5 MG tablet Take 1 tablet (2.5 mg total) by mouth daily. 90 tablet 1   lisinopril-hydrochlorothiazide (ZESTORETIC) 20-12.5 MG tablet TAKE ONE TABLET BY MOUTH DAILY 90 tablet 1   meloxicam (MOBIC) 15 MG tablet TAKE ONE TABLET BY MOUTH DAILY 30 tablet 0   NYAMYC powder APPLY TOPICALLY THREE TIMES A DAY 15 g 1   polyethylene glycol (MIRALAX / GLYCOLAX) 17 g packet Take 17 g by mouth daily.     risperiDONE (RISPERDAL) 0.5 MG tablet Take 1-2 tablets (0.5-1 mg total) by mouth as directed. Take 1 tablet daily in the AM and 2 tablets at  bedtime 270 tablet 1   traMADol (ULTRAM) 50 MG tablet Take by mouth.     traZODone (DESYREL) 100 MG tablet Take 1 tablet (100 mg total) by mouth at bedtime. 90 tablet 1   carbidopa-levodopa (SINEMET CR) 50-200 MG tablet Take by mouth 4 (four) times daily.     No current facility-administered medications for this visit.     Musculoskeletal: Strength & Muscle Tone: within normal limits Gait & Station: normal Patient leans: N/A  Psychiatric Specialty Exam: Review of Systems  Psychiatric/Behavioral:  Negative for behavioral problems, confusion, decreased concentration, dysphoric mood, hallucinations, self-injury, sleep disturbance and suicidal ideas. The patient is not nervous/anxious and is not hyperactive.   All other systems reviewed and are negative.  Blood pressure (!) 150/62, pulse 93, temperature 97.8 F (36.6 C), temperature source Temporal, weight 220 lb 3.2 oz (99.9 kg).Body mass index is 35.54 kg/m.  General Appearance: Casual  Eye Contact:  Fair  Speech:  Clear and Coherent  Volume:  Normal  Mood:  Euthymic  Affect:  Full Range  Thought Process:  Goal Directed and Descriptions of Associations: Intact  Orientation:  Full (Time, Place, and Person)  Thought Content: Logical   Suicidal Thoughts:  No  Homicidal Thoughts:  No  Memory:  Immediate;   Fair Recent;   Fair Remote;   limited  Judgement:  Fair  Insight:  Fair  Psychomotor Activity:  Normal  Concentration:  Concentration: Fair and Attention Span: Fair  Recall:  [employer] of Knowledge: Fair  Language: Fair  Akathisia:  No  Handed:  Right  AIMS (if indicated): done, 0  Assets:  Communication Skills Desire for Improvement Housing Social Support  ADL's:  Intact  Cognition: WNL  Sleep:  Fair   Screenings: [facility] Office Visit from 12/01/2021 in [facility] Office Visit from 01/21/2021 in [facility] Total Score 0 0       GAD-7    [place] from 03/25/2021 in [facility] 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 [facility]  Total GAD-7 Score 4 7      PHQ2-9    [place]  Office Visit from 10/21/2021 in [facility] Visit from 08/12/2021 in [facility] from 06/10/2021 in [facility] Visit from 05/23/2021 in [facility] Office Visit from 05/15/2021 in [facility]  PHQ-2 Total Score 2 1 1 1 1   PHQ-9 Total Score 6 5 -- -- 9      [place] Office Visit from 12/01/2021 in [facility] Counselor from 06/10/2021 in [facility] Office Visit from 05/23/2021 in [facility] No Risk No Risk No Risk        Assessment and Plan: Kaylie Ritter is a 76 year old Caucasian female, married, lives in [place], has a history of bipolar disorder, GAD, insomnia, multiple medical problems including primary Parkinson's disease, hearing loss was evaluated in office today.  Patient is currently stable.  Plan as noted below.  Plan  Bipolar disorder in remission Risperidone 0.5 mg p.o. daily in the morning and 1 mg at night Prozac 60 mg p.o. daily  GAD-stable Continue CBT with Ms. Christina Hussami Prozac 60 mg p.o. daily  Insomnia-stable Trazodone 50-100 mg p.o. nightly as needed Melatonin as needed  Elevated blood pressure reading-patient provided education.  Patient advised to monitor her blood pressure.  High risk medication use-will order prolactin level.  Provided lab slip.  Collateral information obtained from husband [name] as noted above.  Follow-up in clinic in 3 months or sooner if needed.  This note was generated in part or whole with voice recognition software. Voice recognition is usually quite accurate but there  are transcription errors that can and very often do occur. I apologize for any typographical errors that were not detected and corrected.     Ursula Alert, MD 12/01/2021, 2:01 PM

## 2021-12-02 ENCOUNTER — Ambulatory Visit (INDEPENDENT_AMBULATORY_CARE_PROVIDER_SITE_OTHER): Payer: Medicare PPO | Admitting: Licensed Clinical Social Worker

## 2021-12-02 DIAGNOSIS — F3176 Bipolar disorder, in full remission, most recent episode depressed: Secondary | ICD-10-CM | POA: Diagnosis not present

## 2021-12-02 NOTE — Progress Notes (Signed)
Virtual Visit via Audio Note  I connected with Kendra [name] on 12/02/21 at 11:00 AM EST by an audio enabled telemedicine application and verified that I am speaking with the correct person using two identifiers.  Location: Patient: home Provider: remote office [place], [place])   I discussed the limitations of evaluation and management by telemedicine and the availability of in person appointments. The patient expressed understanding and agreed to proceed.  I discussed the assessment and treatment plan with the patient. The patient was provided an opportunity to ask questions and all were answered. The patient agreed with the plan and demonstrated an understanding of the instructions.   The patient was advised to call back or seek an in-person evaluation if the symptoms worsen or if the condition fails to improve as anticipated.  I provided 25 minutes of non-face-to-face time during this encounter.   Aayla Marrocco R Josephus Harriger, LCSW   THERAPIST PROGRESS NOTE  Session Time: 10-1025a  Participation Level: Active  Behavioral Response: NAAlertAnxious and Depressed  Type of Therapy: Individual Therapy  Treatment Goals addressed:   Problem: Reduce overall frequency, intensity, and duration of the anxiety so that daily functioning is not impaired  Goal: LTG: Patient will score less than 5 on the Generalized Anxiety Disorder 7 Scale (GAD-7) Note: Pt reports that mood is stable and that she is anxious/stressed situationally--most recent triggered by upcoming colonoscopy  Goal: STG: Patient will participate in at least 80% of scheduled individual psychotherapy sessions Note: Pt on time and engaged throughout session  Interventions:  Intervention: Assess emotional status and coping mechanisms  Intervention: Encourage family support  Intervention: Assist with relaxation techniques, as appropriate (deep breathing exercises, meditation, guided imagery)   Summary: Kendra [name] is a 76  y.o. female who presents with continuing progress with managing symptoms of mood fluctuations and depression. Pt admits that she has some situational anxiety.  Allowed pt to explore and express thoughts and feelings associated with recent life situations and external stressors. Pt reports that she has upcoming colonoscopy that she has not scheduled yet--discussed specific fears and assisted pt with developing steps to managing fearful situation in advance to prepare herself ahead of time. Pt is very receptive to information.   Pt states that she is looking forward to activities with her husband and family:  watching college ballgames on tv, spending time with husband's family when they come to visit.  Discussed brain stimulating activities that pt enjoys:  cross-stitch, sudoko listening to music, and watching the birds on birdfeeders. Encouraged pt to continue social engagement with others and being as active as possible. Encouraged continuing with stimulating activities.   Reviewed managing depression symptoms and reviewed management of stress/anxiety symptoms.  Continued recommendations are as follows: self care behaviors, positive social engagements, focusing on overall work/home/life balance, and focusing on positive physical and emotional wellness.    Suicidal/Homicidal: No  Therapist Response: Pt is continuing to apply interventions learned in session into daily life situations. Pt is currently on track to meet goals utilizing interventions mentioned above. Personal growth and progress noted. Treatment to continue as indicated.  Plan: Return again in 4 weeks.  Diagnosis: Axis I: Bipolar disorder, depressed, in remission.    Axis II: No diagnosis    [name] Isella Slatten, LCSW 12/02/2021

## 2021-12-03 NOTE — Plan of Care (Signed)
°  Problem: Reduce overall frequency, intensity, and duration of the anxiety so that daily functioning is not impaired. °Goal: LTG: Patient will score less than 5 on the Generalized Anxiety Disorder 7 Scale (GAD-7) °Outcome: Progressing °Goal: STG: Patient will participate in at least 80% of scheduled individual psychotherapy sessions °Outcome: Progressing °  °

## 2021-12-11 DIAGNOSIS — G20 Parkinson's disease: Secondary | ICD-10-CM | POA: Diagnosis not present

## 2021-12-11 DIAGNOSIS — H903 Sensorineural hearing loss, bilateral: Secondary | ICD-10-CM | POA: Diagnosis not present

## 2021-12-11 DIAGNOSIS — H6123 Impacted cerumen, bilateral: Secondary | ICD-10-CM | POA: Diagnosis not present

## 2021-12-17 DIAGNOSIS — F3176 Bipolar disorder, in full remission, most recent episode depressed: Secondary | ICD-10-CM | POA: Diagnosis not present

## 2021-12-17 DIAGNOSIS — R739 Hyperglycemia, unspecified: Secondary | ICD-10-CM | POA: Diagnosis not present

## 2021-12-17 DIAGNOSIS — E559 Vitamin D deficiency, unspecified: Secondary | ICD-10-CM | POA: Diagnosis not present

## 2021-12-17 DIAGNOSIS — Z79899 Other long term (current) drug therapy: Secondary | ICD-10-CM | POA: Diagnosis not present

## 2021-12-17 DIAGNOSIS — E7849 Other hyperlipidemia: Secondary | ICD-10-CM | POA: Diagnosis not present

## 2021-12-17 DIAGNOSIS — Z0000 Encounter for general adult medical examination without abnormal findings: Secondary | ICD-10-CM | POA: Diagnosis not present

## 2021-12-18 ENCOUNTER — Telehealth: Payer: Self-pay | Admitting: Family Medicine

## 2021-12-18 ENCOUNTER — Telehealth: Payer: Self-pay

## 2021-12-18 ENCOUNTER — Telehealth: Payer: Self-pay | Admitting: Psychiatry

## 2021-12-18 ENCOUNTER — Ambulatory Visit: Payer: Self-pay

## 2021-12-18 DIAGNOSIS — M48061 Spinal stenosis, lumbar region without neurogenic claudication: Secondary | ICD-10-CM | POA: Diagnosis not present

## 2021-12-18 DIAGNOSIS — G20 Parkinson's disease: Secondary | ICD-10-CM | POA: Diagnosis not present

## 2021-12-18 DIAGNOSIS — M5416 Radiculopathy, lumbar region: Secondary | ICD-10-CM | POA: Diagnosis not present

## 2021-12-18 LAB — LIPID PANEL
Chol/HDL Ratio: 3.3 ratio (ref 0.0–4.4)
Cholesterol, Total: 190 mg/dL (ref 100–199)
HDL: 58 mg/dL (ref >39)
LDL Chol Calc (NIH): 113 mg/dL — ABNORMAL HIGH (ref 0–99)
Triglycerides: 105 mg/dL (ref 0–149)
VLDL Cholesterol Cal: 19 mg/dL (ref 5–40)

## 2021-12-18 LAB — HEMOGLOBIN A1C
Est. average glucose Bld gHb Est-mCnc: 123 mg/dL
Hgb A1c MFr Bld: 5.9 % — ABNORMAL HIGH (ref 4.8–5.6)

## 2021-12-18 LAB — VITAMIN D 25 HYDROXY (VIT D DEFICIENCY, FRACTURES): Vit D, 25-Hydroxy: 22.1 ng/mL — ABNORMAL LOW (ref 30.0–100.0)

## 2021-12-18 LAB — PROLACTIN: Prolactin: 96.8 ng/mL — ABNORMAL HIGH (ref 4.8–23.3)

## 2021-12-18 NOTE — Telephone Encounter (Signed)
Returned call to patient to discuss her prolactin level currently elevated at 96.8.  Likely secondary to being on risperidone.  Patient currently asymptomatic.  Risperidone is keeping her mood symptoms stable.  Would like to stay on this dosage of risperidone, will consider reducing the dosage in the future and repeating those levels again.  We will also route this message to primary care provider to see if there is any need for further evaluation or investigation at this time.

## 2021-12-18 NOTE — Telephone Encounter (Signed)
Thank you :)

## 2021-12-18 NOTE — Telephone Encounter (Signed)
Opened in error

## 2021-12-18 NOTE — Telephone Encounter (Signed)
°  Chief Complaint: med refill Symptoms: out of tramadol Frequency: NA Pertinent Negatives: NA Disposition: [] ED /[] Urgent Care (no appt availability in office) / [] Appointment(In office/virtual)/ []  [facility]/ [] Home Care/ [] Refused Recommended Disposition /[] [facility]/ [x]  Follow-up with PCP Additional Notes: advised pt since this was a narcotic I would send to provider and would refill if appropriate.    Reason for Disposition  Caller requesting a CONTROLLED substance prescription refill (e.g., narcotics, ADHD medicines)  Answer Assessment - Initial Assessment Questions 1. DRUG NAME: "What medicine do you need to have refilled?"     tramadol 2. REFILLS REMAINING: "How many refills are remaining?" (Note: The label on the medicine or pill bottle will show how many refills are remaining. If there are no refills remaining, then a renewal may be needed.)     0 4. PRESCRIBING HCP: "Who prescribed it?" Reason: If prescribed by specialist, call should be referred to that group.     Dr. B  Protocols used: Medication Refill and Renewal Call-A-[facility]

## 2021-12-18 NOTE — Telephone Encounter (Signed)
Given in result notes.

## 2021-12-18 NOTE — Telephone Encounter (Signed)
This is not a chronic med and was prescribed for 5 days for an acute problem.

## 2021-12-18 NOTE — Telephone Encounter (Signed)
I agree that that prolactin elevation is likely from the risperdal.  Given that she is not symptomatic and MRI brain in 2019 (after previous prolactin elevation) did not show any adenoma, I think it is acceptable to not workup further right now.  Agree with considering dose decrease vs different antipsychotic with less potential for prolactin increase and repeating if able. Thanks!

## 2021-12-18 NOTE — Telephone Encounter (Signed)
Pt called for lab results. Per practice, ok for NT to give results.    NT not available.   Pt requesting call back for results.

## 2021-12-18 NOTE — Telephone Encounter (Signed)
NA at 4:20pm

## 2021-12-21 NOTE — Progress Notes (Signed)
Patient: Kendra [name]  Service Category: E/M  Provider: Gaspar Cola, MD  DOB: 05-30-1946  DOS: 12/22/2021  Referring Provider: Virginia Crews, MD  MRN: [id number]  Setting: Ambulatory outpatient  PCP: Virginia Crews, MD  Type: New Patient  Specialty: Interventional Pain Management    Location: Office  Delivery: Face-to-face     Primary Reason(s) for Visit: Encounter for initial evaluation of one or more chronic problems (new to examiner) potentially causing chronic pain, and posing a threat to normal musculoskeletal function. (Level of risk: High) CC: Hip Pain (both)  HPI  Kendra [name] is a 76 y.o. year old, female patient, who comes for the first time to our practice referred by Virginia Crews, MD for our initial evaluation of her chronic pain. She has History of psychosis; History of insomnia; Malignant neoplasm of upper-outer quadrant of right breast in female, estrogen receptor positive ([place]); Malignant neoplasm of upper-inner quadrant of right breast in female, estrogen receptor positive ([place]); Genetic testing; Incisional hernia, without obstruction or gangrene; B12 deficiency; Adjustment disorder with anxious mood; Essential hypertension; High serum carbohydrate antigen 19-9 (CA19-9); MDD (major depressive disorder), severe ([place]); GAD (generalized anxiety disorder); Elevated tumor markers; Insomnia due to mental condition; Bipolar I disorder, most recent episode depressed ([place]); Alcohol use disorder, moderate, in sustained remission ([place]); Avitaminosis D; Bipolar disorder, in full remission, most recent episode depressed ([place]); Mucinous cystadenoma; Parkinson disease ([place]); Obesity; Osteopenia of neck of right femur; Elevated blood pressure reading; Chronic bilateral low back pain; Polypharmacy; Other hyperlipidemia; Chronic pain syndrome; High risk medication use; Disorder of skeletal system; Problems influencing health status; Chronic hip pain (1ry area of Pain)  (Bilateral) (L>R); Chronic lower extremity pain (2ry area of Pain) (Bilateral) (L>R); Abnormal MRI, lumbar spine (01/31/2020); and Grade 1 Anterolisthesis of lumbar spine (5 mm) (L4/L5) on their problem list. Today she comes in for evaluation of her Hip Pain (both)  Pain Assessment: Location: Left, Right (left is the worse) Hip Radiating: pain radiaties down to the back of her thigh to her knee Onset: More than a month ago Duration: Chronic pain Quality: Shooting, Stabbing, Aching Severity: 6 /10 (subjective, self-reported pain score)  Effect on ADL: limits my daily activities Timing: Intermittent Modifying factors: sit down, heating pad, meds BP: 113/86   HR: 94  Onset and Duration: Gradual and Present longer than 3 months Cause of pain: Unknown Severity: Getting worse, NAS-11 at its worse: 10/10, NAS-11 at its best: 5/10, NAS-11 now: 6/10, and NAS-11 on the average: 4/10 Timing: Not influenced by the time of the day Aggravating Factors: Bending, Climbing, Kneeling, Lifiting, Motion, Prolonged standing, Walking, and Walking uphill Alleviating Factors: Hot packs, Lying down, Sitting, and Warm showers or baths Associated Problems: Constipation, Night-time cramps, Dizziness, Fatigue, Inability to concentrate, Inability to control bladder (urine), Sadness, Sweating, Swelling, Temperature changes, Pain that wakes patient up, and Pain that does not allow patient to sleep Quality of Pain: Aching, Burning, Constant, Disabling, Distressing, Exhausting, Getting longer, Hot, Nagging, Pressure-like, Pulsating, Sharp, Shooting, Stabbing, Tender, Throbbing, Tingling, Tiring, Toothache-like, and Uncomfortable Previous Examinations or Tests: X-rays Previous Treatments: Epidural steroid injections, Narcotic medications, and Physical Therapy  The patient comes into the clinic today with her husband who helped [place] with the patient's information.  According to the patient the primary area of pain is that of the  hips which she identifies as being the area of the buttocks.  This hip pain is (Bilateral) (L>R).  According to the patient she has not had any  hip surgery.  She has been to the [facility] department.  They have done 2 injections for this pain which they indicate did not provide her with any long-term benefit.  The treatments were done by Dr. Sharlet Salina.  On 06/27/2021 he did a bilateral L5 TFESI which he refers provided her with 75% relief of the pain.  On 08/14/2021 he went ahead and did a left-sided L5 and a left-sided S1 TFESI.  According to the patient's husband, neither one of them provided her with any long-term benefit.  The patient had an MRI of the lumbar spine done on 01/31/2020.  According to this MRI the patient has advanced multilevel degenerative changes of the lumbar spine with severe L2-3 and L3-4 canal stenosis.  In addition the patient has multilevel bilateral foraminal stenosis, moderate to severe on the right at the L4-5 level and moderate on the left at the L2-3 level.  The patient denies any pain or numbness in either groin area or the anterior thighs.  She describes the pain as being bilateral in the buttocks area and going down the posterior aspect of the upper leg to the level of the knees.  She refers the left side to be worse than the right.  She denies any pain or numbness from the knees down.  The patient's second area of pain is that of the lower extremities (Bilateral) (L>R).  The patient denies any prior surgeries, x-rays, or injections.  She describes the pain as going through the back of the upper leg to the level of the knee with no pain or numbness below the knees.  She denies any lower extremity weakness.  Physical exam: The patient was able to toe walk and heel walk without any major problems.  She did seem to be having a little bit of weakness with heel walking with her left foot suggesting some L5 weakness.  However, she denies any pain or numbness along the  L5 distribution.  Patrick maneuver was negative bilaterally for sacroiliac joint arthralgia.  The patient did indicate having some buttocks pain, suggesting the possibility of some hip joint arthralgia.  She does have some x-rays of the hips.  The left side was done on 11/28/2019 and read as negative.  However there left hip x-ray done on 11/22/2019 was positive for degenerative changes of the hip joint for both hips.  Today she did present with some decreased range of motion of both hip joints.  Hyperextension and rotation of the lumbar spine did trigger pain in the lower back from the facet joints.  This was confirmed with Lynelle Smoke maneuver.  The pain was bilateral but it seems to be worse on the left side.  Lateral bending of the lumbar spine did trigger some pain in the lower back and buttocks region suggesting foraminal stenosis.  Today I took the time to provide the patient with information regarding my pain practice. The patient was informed that my practice is divided into two sections: an interventional pain management section, as well as a completely separate and distinct medication management section. I explained that I have procedure days for my interventional therapies, and evaluation days for follow-ups and medication management. Because of the amount of documentation required during both, they are kept separated. This means that there is the possibility that she may be scheduled for a procedure on one day, and medication management the next. I have also informed her that because of staffing and facility limitations, I no longer take patients  for medication management only. To illustrate the reasons for this, I gave the patient the example of surgeons, and how inappropriate it would be to refer a patient to his/her care, just to write for the post-surgical antibiotics on a surgery done by a different surgeon.   Because interventional pain management is my board-certified specialty, the patient was  informed that joining my practice means that they are open to any and all interventional therapies. I made it clear that this does not mean that they will be forced to have any procedures done. What this means is that I believe interventional therapies to be essential part of the diagnosis and proper management of chronic pain conditions. Therefore, patients not interested in these interventional alternatives will be better served under the care of a different practitioner.  The patient was also made aware of my Comprehensive Pain Management Safety Guidelines where by joining my practice, they limit all of their nerve blocks and joint injections to those done by our practice, for as long as we are retained to manage their care.   Historic Controlled Substance Pharmacotherapy Review  PMP and historical list of controlled substances: Tramadol 50 mg tablet, 1 tab p.o. twice daily (last filled on 12/10/2021) Current opioid analgesics:   Tramadol 50 mg tablet, 1 tab p.o. twice daily MME/day: 10 mg/day  Historical Monitoring: The patient  reports no history of drug use. List of all UDS Test(s): Lab Results  Component Value Date   MDMA NONE DETECTED 04/10/2019   MDMA NONE DETECTED 01/31/2019   COCAINSCRNUR NONE DETECTED 04/10/2019   COCAINSCRNUR NONE DETECTED 01/31/2019   PCPSCRNUR NONE DETECTED 04/10/2019   PCPSCRNUR NONE DETECTED 01/31/2019   THCU NONE DETECTED 04/10/2019   THCU NONE DETECTED 01/31/2019   ETH <10 04/10/2019   ETH <10 01/30/2019   List of other Serum/Urine Drug Screening Test(s):  Lab Results  Component Value Date   COCAINSCRNUR NONE DETECTED 04/10/2019   COCAINSCRNUR NONE DETECTED 01/31/2019   THCU NONE DETECTED 04/10/2019   THCU NONE DETECTED 01/31/2019   ETH <10 04/10/2019   ETH <10 01/30/2019   Historical Background Evaluation: [place] PMP: PDMP reviewed during this encounter. Online review of the past 24-month period conducted.             PMP NARX Score Report:   Narcotic: 271 Sedative: 130 Stimulant: 000 [place] Department of public safety, offender search: Editor, commissioning Information) Non-contributory Risk Assessment Profile: Aberrant behavior: None observed or detected today Risk factors for fatal opioid overdose: None identified today PMP NARX Overdose Risk Score: 150 Fatal overdose hazard ratio (HR): Calculation deferred Non-fatal overdose hazard ratio (HR): Calculation deferred Risk of opioid abuse or dependence: 0.7-3.0% with doses ? 36 MME/day and 6.1-26% with doses ? 120 MME/day. Substance use disorder (SUD) risk level: See below Personal History of Substance Abuse (SUD-Substance use disorder):  Alcohol: Positive Female or Female  Illegal Drugs: Negative  Rx Drugs: Negative  ORT Risk Level calculation: Moderate Risk  Opioid Risk Tool - 12/22/21 0924       Family History of Substance Abuse   Alcohol Negative    Illegal Drugs Negative    Rx Drugs Negative      Personal History of Substance Abuse   Alcohol Positive Female or Female    Illegal Drugs Negative    Rx Drugs Negative      Age   Age between 93-45 years  No      History of Preadolescent Sexual Abuse   History of Preadolescent  Sexual Abuse Negative or Female      Psychological Disease   Psychological Disease Negative    Depression Positive      Total Score   Opioid Risk Tool Scoring 4    Opioid Risk Interpretation Moderate Risk            ORT Scoring interpretation table:  Score <3 = Low Risk for SUD  Score between 4-7 = Moderate Risk for SUD  Score >8 = High Risk for Opioid Abuse   PHQ-2 Depression Scale:  Total score:    PHQ-2 Scoring interpretation table: (Score and probability of major depressive disorder)  Score 0 = No depression  Score 1 = 15.4% Probability  Score 2 = 21.1% Probability  Score 3 = 38.4% Probability  Score 4 = 45.5% Probability  Score 5 = 56.4% Probability  Score 6 = 78.6% Probability   PHQ-9 Depression Scale:  Total score:    PHQ-9 Scoring  interpretation table:  Score 0-4 = No depression  Score 5-9 = Mild depression  Score 10-14 = Moderate depression  Score 15-19 = Moderately severe depression  Score 20-27 = Severe depression (2.4 times higher risk of SUD and 2.89 times higher risk of overuse)   Pharmacologic Plan: As per protocol, I have not taken over any controlled substance management, pending the results of ordered tests and/or consults.            Initial impression: Pending review of available data and ordered tests.  Meds   Current Outpatient Medications:    Acetaminophen 500 MG capsule, Take 1,000 mg by mouth every 6 (six) hours as needed. , Disp: , Rfl:    carbidopa-levodopa (SINEMET IR) 25-100 MG tablet, Take 1 tablet by mouth at bedtime. , Disp: , Rfl:    cyanocobalamin (,VITAMIN B-12,) 1000 MCG/ML injection, INJECT 1 ML INTO THE MUSCLE EVERY 30 DAYS, Disp: 3 mL, Rfl: 3   denosumab (PROLIA) 60 MG/ML SOSY injection, Inject 60 mg into the skin every 6 (six) months. takes annually, Disp: , Rfl:    diclofenac Sodium (VOLTAREN) 1 % GEL, Apply 4 g topically 4 (four) times daily as needed., Disp: 100 g, Rfl: 0   FLUoxetine HCl 60 MG TABS, TAKE ONE TABLET BY MOUTH DAILY, Disp: 90 tablet, Rfl: 1   gabapentin (NEURONTIN) 100 MG capsule, Take 3 capsules (300 mg total) by mouth 3 (three) times daily., Disp: 280 capsule, Rfl: 0   gabapentin (NEURONTIN) 100 MG capsule, Take 1 capsule (100 mg total) by mouth at bedtime., Disp: 30 capsule, Rfl: 0   letrozole (FEMARA) 2.5 MG tablet, Take 1 tablet (2.5 mg total) by mouth daily., Disp: 90 tablet, Rfl: 1   lisinopril-hydrochlorothiazide (ZESTORETIC) 20-12.5 MG tablet, TAKE ONE TABLET BY MOUTH DAILY, Disp: 90 tablet, Rfl: 1   meloxicam (MOBIC) 15 MG tablet, TAKE ONE TABLET BY MOUTH DAILY, Disp: 30 tablet, Rfl: 0   NYAMYC powder, APPLY TOPICALLY THREE TIMES A DAY, Disp: 15 g, Rfl: 1   polyethylene glycol (MIRALAX / GLYCOLAX) 17 g packet, Take 17 g by mouth daily., Disp: , Rfl:     risperiDONE (RISPERDAL) 0.5 MG tablet, Take 1-2 tablets (0.5-1 mg total) by mouth as directed. Take 1 tablet daily in the AM and 2 tablets at bedtime, Disp: 270 tablet, Rfl: 1   traMADol (ULTRAM) 50 MG tablet, Take by mouth., Disp: , Rfl:    traZODone (DESYREL) 100 MG tablet, Take 1 tablet (100 mg total) by mouth at bedtime., Disp: 90 tablet, Rfl: 1  carbidopa-levodopa (SINEMET CR) 50-200 MG tablet, Take by mouth 4 (four) times daily., Disp: , Rfl:   Imaging Review  Shoulder Imaging: Shoulder-L DG: Results for orders placed during the hospital encounter of 11/28/19 DG Shoulder Left  Narrative CLINICAL DATA:  Left shoulder pain since a fall 11/23/2019. Initial encounter.  EXAM: LEFT SHOULDER - 2+ VIEW  COMPARISON:  None.  FINDINGS: There is no acute bony or joint abnormality. Moderate acromioclavicular osteoarthritis is noted. The glenohumeral joint appears normal.  IMPRESSION: No acute abnormality.  Acromioclavicular osteoarthritis.   Electronically Signed By: Inge Rise M.D. On: 11/28/2019 16:36  Lumbosacral Imaging: Lumbar MR wo contrast: Results for orders placed during the hospital encounter of 01/31/20 MR LUMBAR SPINE WO CONTRAST  Narrative CLINICAL DATA:  Low back pain with bilateral radiculopathy  EXAM: MRI LUMBAR SPINE WITHOUT CONTRAST  TECHNIQUE: Multiplanar, multisequence MR imaging of the lumbar spine was performed. No intravenous contrast was administered.  COMPARISON:  11/22/2019  FINDINGS: Segmentation:  Standard.  Alignment:  5 mm grade on anterolisthesis L4 on L5.  Vertebrae: No fracture, evidence of discitis, or suspicious bone lesion. Scattered small intraosseous hemangiomas, most notable within the L2 and L4 vertebral bodies.  Conus medullaris and cauda equina: Conus extends to the T12-L1 level. Conus appears normal. There is bunching of the cauda equina nerve roots above the L2-3 level of stenosis.  Paraspinal and other soft  tissues: Negative.  Disc levels:  T12-L1: Mild diffuse disc bulge. Mild right greater than left facet arthrosis. No foraminal or canal stenosis.  L1-L2: Mild diffuse disc bulge and mild bilateral facet arthrosis. No foraminal or canal stenosis.  L2-L3: Diffuse disc bulge with advanced bilateral facet arthrosis and ligamentum flavum buckling resulting in severe canal stenosis, severe bilateral subarticular recess stenosis. Moderate left and mild-to-moderate right foraminal stenosis.  L3-L4: Diffuse disc bulge with advanced bilateral facet arthrosis and ligamentum flavum buckling resulting in severe canal stenosis and moderate to severe bilateral subarticular recess stenosis. Foramina are patent.  L4-L5: Disc uncovering with advanced right worse than left facet arthrosis resulting in mild to moderate canal stenosis and moderate to severe right foraminal stenosis and mild-to-moderate left foraminal stenosis. Severe bilateral subarticular recess stenosis.  L5-S1: Unremarkable disc. Bilateral facet arthrosis. No foraminal or canal stenosis.  IMPRESSION: 1. Advanced multilevel degenerative changes of the lumbar spine as described above, with severe L2-3 and L3-4 canal stenosis. 2. Multilevel bilateral foraminal stenosis, moderate-to-severe on the right at L4-5 and moderate on the left at L2-3.   Electronically Signed By: Davina Poke D.O. On: 01/31/2020 09:24  Lumbar DG (Complete) 4+V: Results for orders placed during the hospital encounter of 11/22/19 DG Lumbar Spine Complete  Narrative CLINICAL DATA:  Low back pain for several months with radiation into the left hip, initial encounter  EXAM: LUMBAR SPINE - COMPLETE 4+ VIEW  COMPARISON:  None.  FINDINGS: Five lumbar type vertebral bodies are well visualized. Vertebral body height is well maintained. No pars defects are seen. Facet hypertrophic changes are noted. Mild anterolisthesis of L4 on L5 is seen of a  degenerative nature. Mild osteophytic changes are seen. Disc space narrowing is noted at L2-3. Surrounding soft tissue structures demonstrate considerable retained fecal material likely related to constipation.  IMPRESSION: Degenerative change with anterolisthesis as described.  Changes consistent with colonic constipation.   Electronically Signed By: Inez Catalina M.D. On: 11/22/2019 11:26  Hip Imaging: Hip-R DG 2-3 views: Results for orders placed during the hospital encounter of 11/22/19 DG Hip Unilat W  OR W/O Pelvis 2-3 Views Right  Narrative CLINICAL DATA:  Right hip pain  EXAM: DG HIP (WITH OR WITHOUT PELVIS) 3V RIGHT  COMPARISON:  None.  FINDINGS: Pelvic ring is intact. Degenerative changes of the right hip joint are noted. No fracture or dislocation is seen. Changes of colonic constipation are noted.  IMPRESSION: Degenerative change without acute abnormality.  Colonic constipation.   Electronically Signed By: Inez Catalina M.D. On: 11/22/2019 11:27  Hip-L DG 2-3 views: Results for orders placed during the hospital encounter of 11/28/19 DG Hip Unilat W OR W/O Pelvis 2-3 Views Left  Narrative CLINICAL DATA:  Left hip and upper leg pain since a fall 11/23/2019. Initial encounter.  EXAM: DG HIP (WITH OR WITHOUT PELVIS) 2-3V LEFT  COMPARISON:  None.  FINDINGS: There is no evidence of hip fracture or dislocation. There is no evidence of arthropathy or other focal bone abnormality.  IMPRESSION: Negative exam.   Electronically Signed By: Inge Rise M.D. On: 11/28/2019 16:45  Knee Imaging: Knee-L DG 4 views: Results for orders placed during the hospital encounter of 11/28/19 DG Knee Complete 4 Views Left  Narrative CLINICAL DATA:  Left knee pain since a fall 11/23/2019.  EXAM: LEFT KNEE - COMPLETE 4+ VIEW  COMPARISON:  None.  FINDINGS: No evidence of fracture, dislocation, or joint effusion. No evidence of arthropathy or other focal  bone abnormality. Soft tissues are unremarkable.  IMPRESSION: Normal exam.   Electronically Signed By: Inge Rise M.D. On: 11/28/2019 16:42  Elbow Imaging: Elbow-L DG Complete: Results for orders placed during the hospital encounter of 11/28/19 DG Elbow Complete Left  Narrative CLINICAL DATA:  Left elbow pain since the patient suffered a fall 11/23/2019. Initial encounter.  EXAM: LEFT ELBOW - COMPLETE 3+ VIEW  COMPARISON:  None.  FINDINGS: The patient has an acute mildly impacted fracture through the lateral aspect of the radial head extending into the neck. No other acute bony abnormality is seen. Small elbow joint effusion is noted.  IMPRESSION: Acute, mildly impacted fracture through the lateral aspect of the radial head extends into the neck of the radius.   Electronically Signed By: Inge Rise M.D. On: 11/28/2019 16:39  Wrist Imaging: Wrist-L DG Complete: Results for orders placed during the hospital encounter of 11/28/19 DG Wrist Complete Left  Narrative CLINICAL DATA:  Left wrist pain since a fall 11/23/2019. Initial encounter.  EXAM: LEFT WRIST - COMPLETE 3+ VIEW  COMPARISON:  None.  FINDINGS: The patient has a remote healed impaction fracture of the distal radius. There is also a remote ulnar styloid fracture. No acute abnormality is identified. First CMC osteoarthritis is noted.  IMPRESSION: No acute abnormality.  Remote healed fracture of the distal metaphysis of the radius. Remote ulnar styloid fracture also noted.  First CMC osteoarthritis.   Electronically Signed By: Inge Rise M.D. On: 11/28/2019 16:41  Complexity Note: Imaging results reviewed. Results shared with Ms. Scantlebury, using Layman's terms.                        ROS  Cardiovascular: High blood pressure Pulmonary or Respiratory: Snoring  Neurological: No reported neurological signs or symptoms such as seizures, abnormal skin sensations, urinary and/or  fecal incontinence, being born with an abnormal open spine and/or a tethered spinal cord Psychological-Psychiatric: Anxiousness and Depressed Gastrointestinal: Heartburn due to stomach pushing into lungs (Hiatal hernia) and Irregular, infrequent bowel movements (Constipation) Genitourinary: No reported renal or genitourinary signs or symptoms such as  difficulty voiding or producing urine, peeing blood, non-functioning kidney, kidney stones, difficulty emptying the bladder, difficulty controlling the flow of urine, or chronic kidney disease Hematological: No reported hematological signs or symptoms such as prolonged bleeding, low or poor functioning platelets, bruising or bleeding easily, hereditary bleeding problems, low energy levels due to low hemoglobin or being anemic Endocrine: No reported endocrine signs or symptoms such as high or low blood sugar, rapid heart rate due to high thyroid levels, obesity or weight gain due to slow thyroid or thyroid disease Rheumatologic: Constant unexplained fatigue (Chronic Fatigue Syndrome) Musculoskeletal: Negative for myasthenia gravis, muscular dystrophy, multiple sclerosis or malignant hyperthermia Work History: Retired  Allergies  Ms. Coonan is allergic to hydroxyzine, other, and shrimp [shellfish allergy].  Laboratory Chemistry Profile   Renal Lab Results  Component Value Date   BUN 5 (L) 09/01/2021   CREATININE 0.69 09/01/2021   BCR 8 (L) 02/22/2020   GFRAA >60 04/29/2020   GFRNONAA >60 09/01/2021   SPECGRAV 1.025 03/18/2020   PHUR 7.5 03/18/2020   PROTEINUR Negative 03/18/2020     Electrolytes Lab Results  Component Value Date   NA 136 09/01/2021   K 3.6 09/01/2021   CL 99 09/01/2021   CALCIUM 8.9 09/01/2021     Hepatic Lab Results  Component Value Date   AST 19 09/01/2021   ALT 7 09/01/2021   ALBUMIN 4.0 09/01/2021   ALKPHOS 29 (L) 09/01/2021     ID Lab Results  Component Value Date   SARSCOV2NAA NEGATIVE 09/15/2019    HCVAB <0.1 06/01/2019     Bone Lab Results  Component Value Date   VD25OH 22.1 (L) 12/17/2021   TESTOSTERONE 39 06/20/2015     Endocrine Lab Results  Component Value Date   GLUCOSE 124 (H) 09/01/2021   GLUCOSEU NEGATIVE 04/10/2019   HGBA1C 5.9 (H) 12/17/2021   TSH 2.31 04/13/2019   TESTOSTERONE 39 06/20/2015     Neuropathy Lab Results  Component Value Date   VITAMINB12 428 09/01/2021   FOLATE 7.2 04/29/2020   HGBA1C 5.9 (H) 12/17/2021     CNS No results found for: COLORCSF, APPEARCSF, RBCCOUNTCSF, WBCCSF, POLYSCSF, LYMPHSCSF, EOSCSF, PROTEINCSF, GLUCCSF, JCVIRUS, CSFOLI, IGGCSF, LABACHR, ACETBL, LABACHR, ACETBL   Inflammation (CRP: Acute   ESR: Chronic) No results found for: CRP, ESRSEDRATE, LATICACIDVEN   Rheumatology No results found for: RF, ANA, LABURIC, URICUR, LYMEIGGIGMAB, LYMEABIGMQN, HLAB27   Coagulation Lab Results  Component Value Date   PLT 189 09/01/2021     Cardiovascular Lab Results  Component Value Date   TROPONINI <0.03 07/25/2018   HGB 12.9 09/01/2021   HCT 38.9 09/01/2021     Screening Lab Results  Component Value Date   SARSCOV2NAA NEGATIVE 09/15/2019   COVIDSOURCE NASOPHARYNGEAL 04/10/2019   HCVAB <0.1 06/01/2019     Cancer No results found for: [name], CA125, LABCA2   Allergens No results found for: ALMOND, APPLE, ASPARAGUS, AVOCADO, BANANA, BARLEY, BASIL, BAYLEAF, GREENBEAN, LIMABEAN, WHITEBEAN, BEEFIGE, REDBEET, BLUEBERRY, BROCCOLI, CABBAGE, MELON, CARROT, CASEIN, CASHEWNUT, CAULIFLOWER, CELERY     Note: Lab results reviewed.  [place]  Drug: Ms. Backes  reports no history of drug use. Alcohol:  reports that she does not currently use alcohol. Tobacco:  reports that she has never smoked. She has never used smokeless tobacco. Medical:  has a past medical history of Anxiety, Breast cancer ([place]) (03/2018), Bursitis, Complication of anesthesia, Depression, Family history of breast cancer (03/24/2018), Family history of lung cancer  (03/24/2018), History of alcohol dependence ([place]) (2007), History of  hiatal hernia, History of psychosis (2014), Hypertension, Parkinson disease ([place]), Personal history of radiation therapy (06/2018-07/2018), and PONV (postoperative nausea and vomiting). Family: family history includes Breast cancer (age of onset: 9) in her cousin; Breast cancer (age of onset: 43) in her mother; Diabetes in her mother; Heart disease in her paternal grandfather; Heart disease (age of onset: 78) in her brother; Heart disease (age of onset: 93) in her paternal grandmother; Hypertension in her mother; Lung cancer in her father.  Past Surgical History:  Procedure Laterality Date   BREAST BIOPSY Right 03/14/2018   11:00 DCIS and invasive ductal carcinoma   BREAST BIOPSY Right 03/14/2018   1:00 Invasive ductal carcinoma   BREAST LUMPECTOMY Right 04/27/2018   lumpectomy of 11 and 1:00 cancers, clear margins, negative LN   BREAST LUMPECTOMY WITH RADIOACTIVE SEED AND SENTINEL LYMPH NODE BIOPSY Right 04/27/2018   Procedure: RIGHT BREAST LUMPECTOMY WITH RADIOACTIVE SEED X 2 AND RIGHT SENTINEL LYMPH NODE BIOPSY;  Surgeon: [name], MD;  Location: [facility];  Service: General;  Laterality: Right;   CATARACT EXTRACTION W/PHACO Left 08/30/2019   Procedure: CATARACT EXTRACTION PHACO AND INTRAOCULAR LENS PLACEMENT ([place]) LEFT panoptix toric  01:20.5  12.7%  10.26;  Surgeon: Leandrew Koyanagi, MD;  Location: [facility];  Service: Ophthalmology;  Laterality: Left;   CATARACT EXTRACTION W/PHACO Right 09/20/2019   Procedure: CATARACT EXTRACTION PHACO AND INTRAOCULAR LENS PLACEMENT ([facility]) RIGHT 5.71  00:36.4  15.7%;  Surgeon: Leandrew Koyanagi, MD;  Location: [facility];  Service: Ophthalmology;  Laterality: Right;   LAPAROTOMY N/A 02/10/2018   Procedure: EXPLORATORY LAPAROTOMY;  Surgeon: Isabel Caprice, MD;  Location: [facility];  Service: Gynecology;  Laterality: N/A;   MASS EXCISION  01/2018    abdominal   OVARIAN CYST REMOVAL     SALPINGOOPHORECTOMY Bilateral 02/10/2018   Procedure: BILATERAL SALPINGO OOPHORECTOMY; PERITONEAL WASHINGS;  Surgeon: Isabel Caprice, MD;  Location: [facility];  Service: Gynecology;  Laterality: Bilateral;   TONSILLECTOMY     TONSILLECTOMY AND ADENOIDECTOMY  1953   Active Ambulatory Problems    Diagnosis Date Noted   History of psychosis 02/07/2018   History of insomnia 02/07/2018   Malignant neoplasm of upper-outer quadrant of right breast in female, estrogen receptor positive ([place]) 03/17/2018   Malignant neoplasm of upper-inner quadrant of right breast in female, estrogen receptor positive ([place]) 03/17/2018   Genetic testing 04/07/2018   Incisional hernia, without obstruction or gangrene 09/12/2018   B12 deficiency 10/17/2018   Adjustment disorder with anxious mood 11/24/2018   Essential hypertension 11/24/2018   High serum carbohydrate antigen 19-9 (CA19-9) 12/01/2018   MDD (major depressive disorder), severe ([place]) 01/31/2019   GAD (generalized anxiety disorder) 04/10/2019   Elevated tumor markers 05/27/2019   Insomnia due to mental condition 05/31/2019   Bipolar I disorder, most recent episode depressed ([place]) 05/31/2019   Alcohol use disorder, moderate, in sustained remission ([place]) 05/31/2019   Avitaminosis D 08/24/2019   Bipolar disorder, in full remission, most recent episode depressed ([place]) 11/08/2019   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   Parkinson disease ([place]) 02/22/2020   Obesity 08/29/2020   Osteopenia of neck of right femur 09/06/2020   Elevated blood pressure reading 05/23/2021   Chronic bilateral low back pain 06/18/2021   Polypharmacy 08/12/2021   Other hyperlipidemia 10/21/2021   Chronic pain syndrome 11/17/2021   High risk medication use 11/17/2021   Disorder of skeletal system 11/17/2021   Problems influencing health status 11/17/2021   Chronic hip pain (1ry area of  Pain) (Bilateral) (L>R) 12/22/2021   Chronic lower  extremity pain (2ry area of Pain) (Bilateral) (L>R) 12/22/2021   Abnormal MRI, lumbar spine (01/31/2020) 12/22/2021   Grade 1 Anterolisthesis of lumbar spine (5 mm) (L4/L5) 12/22/2021   Resolved Ambulatory Problems    Diagnosis Date Noted   Pelvic mass in female 02/07/2018   Breast mass, right 02/07/2018   History of alcohol dependence ([place]) 02/07/2018   Family history of breast cancer 03/24/2018   Family history of lung cancer 03/24/2018   Moderate recurrent major depression ([place]) 07/25/2018   Elevated LFTs 10/17/2018   Screening for lipid disorders 11/24/2018   Altered mental status 04/10/2019   Cognitive disorder 05/31/2019   Candidal skin infection 04/29/2020   Past Medical History:  Diagnosis Date   Anxiety    Breast cancer ([place]) 03/2018   Bursitis    Complication of anesthesia    Depression    History of hiatal hernia    Hypertension    Personal history of radiation therapy 06/2018-07/2018   PONV (postoperative nausea and vomiting)    Constitutional Exam  General appearance: Well nourished, well developed, and well hydrated. In no apparent acute distress Vitals:   12/22/21 0912  BP: 113/86  Pulse: 94  Temp: (!) 97.3 F (36.3 C)  SpO2: 99%  Weight: 220 lb (99.8 kg)  Height: $Remove'5\' 6"'QbOGqgP$  (1.676 m)   BMI Assessment: Estimated body mass index is 35.51 kg/m as calculated from the following:   Height as of this encounter: $RemoveBeforeD'5\' 6"'MudpGEKYhtfvFe$  (1.676 m).   Weight as of this encounter: 220 lb (99.8 kg).  BMI interpretation table: BMI level Category Range association with higher incidence of chronic pain  <18 kg/m2 Underweight   18.5-24.9 kg/m2 Ideal body weight   25-29.9 kg/m2 Overweight Increased incidence by 20%  30-34.9 kg/m2 Obese (Class I) Increased incidence by 68%  35-39.9 kg/m2 Severe obesity (Class II) Increased incidence by 136%  >40 kg/m2 Extreme obesity (Class III) Increased incidence by 254%   Patient's current BMI Ideal Body weight  Body mass index is 35.51 kg/m. Ideal  body weight: 59.3 kg (130 lb 11.7 oz) Adjusted ideal body weight: 75.5 kg (166 lb 7 oz)   BMI Readings from Last 4 Encounters:  12/22/21 35.51 kg/m  10/21/21 35.19 kg/m  09/01/21 34.75 kg/m  08/12/21 34.25 kg/m   Wt Readings from Last 4 Encounters:  12/22/21 220 lb (99.8 kg)  10/21/21 218 lb (98.9 kg)  09/01/21 215 lb 4.8 oz (97.7 kg)  08/12/21 212 lb 3.2 oz (96.3 kg)    Psych/Mental status: Alert, oriented x 3 (person, place, & time)       Eyes: PERLA Respiratory: No evidence of acute respiratory distress  Assessment  Primary Diagnosis & Pertinent Problem List: The primary encounter diagnosis was Chronic pain syndrome. Diagnoses of Pharmacologic therapy, Disorder of skeletal system, Problems influencing health status, Chronic hip pain (1ry area of Pain) (Bilateral) (L>R), Chronic lower extremity pain (2ry area of Pain) (Bilateral) (L>R), Abnormal MRI, lumbar spine (01/31/2020), and Grade 1 Anterolisthesis of lumbar spine (5 mm) (L4/L5) were also pertinent to this visit.  Visit Diagnosis (New problems to examiner): 1. Chronic pain syndrome   2. Pharmacologic therapy   3. Disorder of skeletal system   4. Problems influencing health status   5. Chronic hip pain (1ry area of Pain) (Bilateral) (L>R)   6. Chronic lower extremity pain (2ry area of Pain) (Bilateral) (L>R)   7. Abnormal MRI, lumbar spine (01/31/2020)   8. Grade 1 Anterolisthesis  of lumbar spine (5 mm) (L4/L5)    Plan of Care (Initial workup plan)  Note: Ms. Birnbaum was reminded that as per protocol, today's visit has been an evaluation only. We have not taken over the patient's controlled substance management.  Problem-specific plan: No problem-specific Assessment & Plan notes found for this encounter.  Lab Orders         Compliance Drug Analysis, Ur         Magnesium         Vitamin B12         Sedimentation rate         C-reactive protein     Imaging Orders         DG Lumbar Spine Complete W/Bend      Referral Orders  No referral(s) requested today   Procedure Orders    No procedure(s) ordered today   Pharmacotherapy (current): Medications ordered:  No orders of the defined types were placed in this encounter.  Medications administered during this visit: Corene Lacap had no medications administered during this visit.   Pharmacological management options:  Opioid Analgesics: The patient was informed that there is no guarantee that she would be a candidate for opioid analgesics. The decision will be made following CDC guidelines. This decision will be based on the results of diagnostic studies, as well as Ms. Flater's risk profile.   Membrane stabilizer: To be determined at a later time  Muscle relaxant: To be determined at a later time  NSAID: To be determined at a later time  Other analgesic(s): To be determined at a later time   Interventional management options: Ms. Ciliberto was informed that there is no guarantee that she would be a candidate for interventional therapies. The decision will be based on the results of diagnostic studies, as well as Ms. Zinger's risk profile.  Procedure(s) under consideration:  Pending results of ordered studies      Interventional Therapies  Risk   Complexity Considerations:   Estimated body mass index is 35.51 kg/m as calculated from the following:   Height as of this encounter: $RemoveBeforeD'5\' 6"'pBsuHCsuPlvQgm$  (1.676 m).   Weight as of this encounter: 220 lb (99.8 kg). WNL   Planned   Pending:   Pending further evaluation   Under consideration:   X-rays of the lumbar spine on flexion and extension to evaluate the patient's anterolisthesis. Diagnostic left L2-3 TFESI #1  Diagnostic midline L3-4 LESI #1  Diagnostic bilateral lumbar facet MBB #1    Completed:   None at this time   Completed by other providers:   (06/27/2021) bilateral L5-S1 transforaminal ESI (75% relief) by Sharlet Salina, DO (08/14/2021) left L5-S1 transforaminal ESI + left S1  TFESI by Sharlet Salina, DO   Therapeutic   Palliative (PRN) options:   None established    Provider-requested follow-up: Return for (48min), Eval-day (M,W), (F2F), 2nd Visit, for review of ordered tests.  Future Appointments  Date Time Provider [place]  01/19/2022 11:00 AM Hussami, [name], [facility] None  01/29/2022 10:30 AM Noreene Filbert, MD [facility] None  02/27/2022 10:00 AM [facility] LAB [facility] None  02/27/2022 10:30 AM Cammie Sickle, MD [facility] None  02/27/2022 11:00 AM [facility] INJECTION [facility] None  03/02/2022 10:00 AM Ursula Alert, MD [facility] None  04/20/2022 10:40 AM Bacigalupo, Dionne Bucy, MD [facility]    Note by: Gaspar Cola, MD Date: 12/22/2021; Time: 11:05 AM

## 2021-12-22 ENCOUNTER — Ambulatory Visit
Admission: RE | Admit: 2021-12-22 | Discharge: 2021-12-22 | Disposition: A | Discharge status: Home / Self Care | Payer: Medicare PPO | Source: Ambulatory Visit | Attending: Pain Medicine | Admitting: Pain Medicine

## 2021-12-22 ENCOUNTER — Ambulatory Visit: Payer: Medicare PPO | Admitting: Pain Medicine

## 2021-12-22 ENCOUNTER — Other Ambulatory Visit: Payer: Self-pay

## 2021-12-22 ENCOUNTER — Ambulatory Visit
Admission: RE | Admit: 2021-12-22 | Discharge: 2021-12-22 | Disposition: A | Discharge status: Home / Self Care | Payer: Medicare PPO | Attending: Pain Medicine | Admitting: Pain Medicine

## 2021-12-22 ENCOUNTER — Ambulatory Visit: Payer: Self-pay | Admitting: *Deleted

## 2021-12-22 ENCOUNTER — Encounter: Payer: Self-pay | Admitting: Pain Medicine

## 2021-12-22 VITALS — BP 113/86 | HR 94 | Temp 97.3°F | Ht 66.0 in | Wt 220.0 lb

## 2021-12-22 DIAGNOSIS — G8929 Other chronic pain: Secondary | ICD-10-CM

## 2021-12-22 DIAGNOSIS — R937 Abnormal findings on diagnostic imaging of other parts of musculoskeletal system: Secondary | ICD-10-CM

## 2021-12-22 DIAGNOSIS — M25552 Pain in left hip: Secondary | ICD-10-CM | POA: Insufficient documentation

## 2021-12-22 DIAGNOSIS — M899 Disorder of bone, unspecified: Secondary | ICD-10-CM

## 2021-12-22 DIAGNOSIS — M25551 Pain in right hip: Secondary | ICD-10-CM | POA: Diagnosis not present

## 2021-12-22 DIAGNOSIS — M4316 Spondylolisthesis, lumbar region: Secondary | ICD-10-CM | POA: Insufficient documentation

## 2021-12-22 DIAGNOSIS — M79604 Pain in right leg: Secondary | ICD-10-CM

## 2021-12-22 DIAGNOSIS — M47816 Spondylosis without myelopathy or radiculopathy, lumbar region: Secondary | ICD-10-CM | POA: Diagnosis not present

## 2021-12-22 DIAGNOSIS — M79605 Pain in left leg: Secondary | ICD-10-CM | POA: Diagnosis not present

## 2021-12-22 DIAGNOSIS — Z789 Other specified health status: Secondary | ICD-10-CM | POA: Diagnosis not present

## 2021-12-22 DIAGNOSIS — G894 Chronic pain syndrome: Secondary | ICD-10-CM

## 2021-12-22 DIAGNOSIS — Z79899 Other long term (current) drug therapy: Secondary | ICD-10-CM | POA: Diagnosis not present

## 2021-12-22 DIAGNOSIS — I700 Atherosclerosis of aorta: Secondary | ICD-10-CM | POA: Diagnosis not present

## 2021-12-22 NOTE — Telephone Encounter (Signed)
°  Chief Complaint: requesting tramadol refill. C/o left hip pain Symptoms: left hip pain greater than right hip pain. Calling to request medication refill . Reviewed message from Dr. Jacinto Reap from 12/18/21. Not a chronic med and was prescribed for 5 days for an acute problem. Patient reports difficulty going up and down stairs . Frequency: approx 1 year  Pertinent Negatives: Patient denies inability to walk.  Disposition: [] ED /[] Urgent Care (no appt availability in office) / [x] Appointment(In office/virtual)/ []  [facility]/ [] Home Care/ [] Refused Recommended Disposition /[] [facility]/ []  Follow-up with PCP Additional Notes:  Appt scheduled to review pain level and evaluation for medication if needed.    Reason for Disposition  [1] MODERATE pain (e.g., interferes with normal activities, limping) AND [2] present > 3 days  Answer Assessment - Initial Assessment Questions 1. LOCATION and RADIATION: "Where is the pain located?"      Left hip greater then right hip 2. QUALITY: "What does the pain feel like?"  (e.g., sharp, dull, aching, burning)     pain 3. SEVERITY: "How bad is the pain?" "What does it keep you from doing?"   (Scale 1-10; or mild, moderate, severe)   -  MILD (1-3): doesn't interfere with normal activities    -  MODERATE (4-7): interferes with normal activities (e.g., work or school) or awakens from sleep, limping    -  SEVERE (8-10): excruciating pain, unable to do any normal activities, unable to walk     Can walk but difficulty going up and down steps 4. ONSET: "When did the pain start?" "Does it come and go, or is it there all the time?"     Approx 1 year  5. WORK OR EXERCISE: "Has there been any recent work or exercise that involved this part of the body?"      na 6. CAUSE: "What do you think is causing the hip pain?"      na 7. AGGRAVATING FACTORS: "What makes the hip pain worse?" (e.g., walking, climbing stairs, running)     Climbing stairs  8. OTHER  SYMPTOMS: "Do you have any other symptoms?" (e.g., back pain, pain shooting down leg,  fever, rash)     na  Protocols used: Hip Pain-A-AH

## 2021-12-22 NOTE — Progress Notes (Signed)
Safety precautions to be maintained throughout the outpatient stay will include: orient to surroundings, keep bed in low position, maintain call bell within reach at all times, provide assistance with transfer out of bed and ambulation.  

## 2021-12-22 NOTE — Telephone Encounter (Signed)
NA, left detailed voice message. Advised to call back with any questions concerns.

## 2021-12-23 ENCOUNTER — Other Ambulatory Visit: Payer: Self-pay

## 2021-12-23 ENCOUNTER — Ambulatory Visit: Payer: Medicare PPO | Admitting: Family Medicine

## 2021-12-23 ENCOUNTER — Encounter: Payer: Self-pay | Admitting: Family Medicine

## 2021-12-23 VITALS — BP 131/80 | HR 87 | Temp 97.8°F | Wt 220.0 lb

## 2021-12-23 DIAGNOSIS — M25551 Pain in right hip: Secondary | ICD-10-CM | POA: Diagnosis not present

## 2021-12-23 DIAGNOSIS — H1013 Acute atopic conjunctivitis, bilateral: Secondary | ICD-10-CM | POA: Insufficient documentation

## 2021-12-23 DIAGNOSIS — M545 Low back pain, unspecified: Secondary | ICD-10-CM

## 2021-12-23 DIAGNOSIS — L590 Erythema ab igne [dermatitis ab igne]: Secondary | ICD-10-CM | POA: Diagnosis not present

## 2021-12-23 DIAGNOSIS — G20A1 Parkinson's disease without dyskinesia, without mention of fluctuations: Secondary | ICD-10-CM

## 2021-12-23 DIAGNOSIS — G20 Parkinson's disease: Secondary | ICD-10-CM

## 2021-12-23 DIAGNOSIS — G8929 Other chronic pain: Secondary | ICD-10-CM

## 2021-12-23 DIAGNOSIS — M25552 Pain in left hip: Secondary | ICD-10-CM | POA: Diagnosis not present

## 2021-12-23 DIAGNOSIS — M79605 Pain in left leg: Secondary | ICD-10-CM | POA: Diagnosis not present

## 2021-12-23 DIAGNOSIS — M79604 Pain in right leg: Secondary | ICD-10-CM | POA: Diagnosis not present

## 2021-12-23 LAB — SEDIMENTATION RATE: Sed Rate: 20 mm/hr (ref 0–40)

## 2021-12-23 LAB — VITAMIN B12: Vitamin B-12: 607 pg/mL (ref 232–1245)

## 2021-12-23 LAB — C-REACTIVE PROTEIN: CRP: 2 mg/L (ref 0–10)

## 2021-12-23 LAB — MAGNESIUM: Magnesium: 2 mg/dL (ref 1.6–2.3)

## 2021-12-23 NOTE — Progress Notes (Signed)
Established patient visit   Patient: Kendra [name]   DOB: 01/16/1946   76 y.o. Female  MRN: [id number] Visit Date: 12/23/2021  Today's healthcare provider: Lavon Paganini, MD   Chief Complaint  Patient presents with   Hip Pain   I,Sulibeya S Dimas,acting as a scribe for Lavon Paganini, MD.,have documented all relevant documentation on the behalf of Lavon Paganini, MD,as directed by  Lavon Paganini, MD while in the presence of Lavon Paganini, MD.  Subjective    Hip Pain   Patient here today C/O left hip pain and requesting refill of tramadol for pain. Patient is F/B pain clinic Dr. Dossie Arbour. Patient was seen yesterday in office for the first time with Dr. Dossie Arbour. He did order x-rays and treatment is pending for tests ordered. Patient husband is requesting to talk to Dr. B about medications. He reports he is giving carbidopa-Levodaopa different than what is prescribed. He is also has questions regarding other medications. He reports he will discuss with provider only.  Tramadol #60 last filled 2/8 by [facility] ([facility])  Medications: Outpatient Medications Prior to Visit  Medication Sig   Acetaminophen 500 MG capsule Take 1,000 mg by mouth every 6 (six) hours as needed.    cyanocobalamin (,VITAMIN B-12,) 1000 MCG/ML injection INJECT 1 ML INTO THE MUSCLE EVERY 30 DAYS   denosumab (PROLIA) 60 MG/ML SOSY injection Inject 60 mg into the skin every 6 (six) months. takes annually   diclofenac Sodium (VOLTAREN) 1 % GEL Apply 4 g topically 4 (four) times daily as needed.   FLUoxetine HCl 60 MG TABS TAKE ONE TABLET BY MOUTH DAILY   gabapentin (NEURONTIN) 100 MG capsule Take 3 capsules (300 mg total) by mouth 3 (three) times daily.   gabapentin (NEURONTIN) 100 MG capsule Take 1 capsule (100 mg total) by mouth at bedtime.   letrozole (FEMARA) 2.5 MG tablet Take 1 tablet (2.5 mg total) by mouth daily.   lisinopril-hydrochlorothiazide (ZESTORETIC) 20-12.5 MG tablet TAKE  ONE TABLET BY MOUTH DAILY   meloxicam (MOBIC) 15 MG tablet TAKE ONE TABLET BY MOUTH DAILY   NYAMYC powder APPLY TOPICALLY THREE TIMES A DAY   polyethylene glycol (MIRALAX / GLYCOLAX) 17 g packet Take 17 g by mouth daily.   risperiDONE (RISPERDAL) 0.5 MG tablet Take 1-2 tablets (0.5-1 mg total) by mouth as directed. Take 1 tablet daily in the AM and 2 tablets at bedtime   traMADol (ULTRAM) 50 MG tablet Take by mouth.   traZODone (DESYREL) 100 MG tablet Take 1 tablet (100 mg total) by mouth at bedtime.   carbidopa-levodopa (SINEMET CR) 50-200 MG tablet Take by mouth 4 (four) times daily.   carbidopa-levodopa (SINEMET IR) 25-100 MG tablet Take 1 tablet by mouth at bedtime.    No facility-administered medications prior to visit.    Review of Systems  Constitutional:  Negative for appetite change.  Respiratory:  Negative for shortness of breath.   Cardiovascular:  Negative for chest pain and palpitations.  Musculoskeletal:  Positive for arthralgias and myalgias.      Objective    BP 131/80 (BP Location: Left Arm, Patient Position: Sitting, Cuff Size: Large)    Pulse 87    Temp 97.8 F (36.6 C) (Temporal)    Wt 220 lb (99.8 kg)    HC 16" (40.6 cm)    SpO2 97%    BMI 35.51 kg/m  {Show previous vital signs (optional):23777}  Physical Exam Vitals reviewed.  Constitutional:      General:  She is not in acute distress.    Appearance: She is well-developed.  HENT:     Head: Normocephalic and atraumatic.  Eyes:     General: Lids are normal. No scleral icterus.    Extraocular Movements: Extraocular movements intact.     Conjunctiva/sclera:     Right eye: Right conjunctiva is injected.     Left eye: Left conjunctiva is injected.  Cardiovascular:     Rate and Rhythm: Normal rate and regular rhythm.  Pulmonary:     Effort: Pulmonary effort is normal. No respiratory distress.  Skin:    General: Skin is warm and dry.     Findings: No rash.  Neurological:     Mental Status: She is alert  and oriented to person, place, and time.  Psychiatric:        Behavior: Behavior normal.      No results found for any visits on 12/23/21.  Assessment & Plan     Problem List Items Addressed This Visit       Nervous and Auditory   Parkinson disease ([place])    Continue to follow with Neuro She was not taking her meds as Rx'd so reviewed that with her husband Upcoming [facility] appt        Musculoskeletal and Integument   Erythema ab igne    patinet with picture of skin changes on back of thighs/back Classic erythema ab igne after using heating pad - discussed not sleeping on heating pad, but ok to continue use        Other   Chronic hip pain (1ry area of Pain) (Bilateral) (L>R) (Chronic)   Chronic lower extremity pain (2ry area of Pain) (Bilateral) (L>R) (Chronic)    Chronic hip and back pain Had eval with pain management with [facility] Tramadol is being prescribed by [facility] - discussed continuing to follow with them for this medication I did not Rx any pain meds today She was only taking gabapentin 200mg  TID, so increased to 300mg  TID as previously written      Chronic bilateral low back pain - Primary   Allergic conjunctivitis of both eyes    Trial of antihistamine like Zyrtec vs OTC allergic eye drops, such as patanol Return precautions discussed        Return for as scheduled.      I, Lavon Paganini, MD, have reviewed all documentation for this visit. The documentation on 12/23/21 for the exam, diagnosis, procedures, and orders are all accurate and complete.   Kendra [name], Dionne Bucy, MD, MPH [facility]

## 2021-12-23 NOTE — Assessment & Plan Note (Signed)
patinet with picture of skin changes on back of thighs/back Classic erythema ab igne after using heating pad - discussed not sleeping on heating pad, but ok to continue use

## 2021-12-23 NOTE — Assessment & Plan Note (Addendum)
Chronic hip and back pain Had eval with pain management with [facility] Tramadol is being prescribed by [facility] - discussed continuing to follow with them for this medication I did not Rx any pain meds today She was only taking gabapentin 200mg  TID, so increased to 300mg  TID as previously written

## 2021-12-23 NOTE — Assessment & Plan Note (Signed)
Trial of antihistamine like Zyrtec vs OTC allergic eye drops, such as patanol Return precautions discussed

## 2021-12-23 NOTE — Assessment & Plan Note (Signed)
Continue to follow with Neuro She was not taking her meds as Rx'd so reviewed that with her husband Upcoming [facility] appt

## 2021-12-24 ENCOUNTER — Ambulatory Visit: Payer: Self-pay | Admitting: *Deleted

## 2021-12-24 NOTE — Telephone Encounter (Signed)
Patient was seen yesterday by PCP and dx with allergic conjunctivitis, caller states he forget the name of the OTC PCP recommended and its not in the AVS, please call spouse back at  [phone]    Advised per PCP note eye drops recommended 'Patanol.' Verbalizes understanding.     Reason for Disposition  Caller has medicine question only, adult not sick, AND triager answers question  Answer Assessment - Initial Assessment Questions 1. NAME of MEDICATION: "What medicine are you calling about?"     Eyes drops recommended at OV yesterday 2. QUESTION: "What is your question?" (e.g., double dose of medicine, side effect)     Name of med  Protocols used: Medication Question Call-A-AH

## 2021-12-25 ENCOUNTER — Telehealth: Payer: Self-pay | Admitting: Family Medicine

## 2021-12-25 NOTE — Telephone Encounter (Signed)
Claiborne Billings advised of approved orders.

## 2021-12-25 NOTE — Telephone Encounter (Signed)
Home Health Verbal Orders - Caller/Agency:[facility] Callback Number:[phone] Requesting PT Frequency: 2x4 1x4

## 2021-12-25 NOTE — Telephone Encounter (Signed)
OK for verbals 

## 2021-12-26 LAB — COMPLIANCE DRUG ANALYSIS, UR

## 2022-01-07 ENCOUNTER — Telehealth: Payer: Self-pay

## 2022-01-07 NOTE — Telephone Encounter (Signed)
Left message advising to call neurology for correct dose of medication.  ? ?Copied from [place]. Topic: General - Other ?>> Jan 07, 2022 10:47 AM Holley Dexter N wrote: ?Reason for CRM: Pts husband called in stating one time before they had PCP explain the medications pt is on, and they have gotten a little confused again, and requested if PCP could reach out to the Neurologist and see what instruction of medication carbidopa-levodopa (SINEMET CR) 50-200 MG tablet and carbidopa-levodopa (SINEMET IR) 25-100 MG tablet, he states that they have be given 3 different interpretations on the medication and needs clarification, please advise. ?

## 2022-01-14 DIAGNOSIS — M5416 Radiculopathy, lumbar region: Secondary | ICD-10-CM | POA: Diagnosis not present

## 2022-01-14 DIAGNOSIS — G20 Parkinson's disease: Secondary | ICD-10-CM | POA: Diagnosis not present

## 2022-01-19 ENCOUNTER — Ambulatory Visit (INDEPENDENT_AMBULATORY_CARE_PROVIDER_SITE_OTHER): Payer: Medicare PPO | Admitting: Licensed Clinical Social Worker

## 2022-01-19 ENCOUNTER — Telehealth: Payer: Self-pay | Admitting: Family Medicine

## 2022-01-19 ENCOUNTER — Other Ambulatory Visit: Payer: Self-pay

## 2022-01-19 DIAGNOSIS — F411 Generalized anxiety disorder: Secondary | ICD-10-CM

## 2022-01-19 DIAGNOSIS — F3176 Bipolar disorder, in full remission, most recent episode depressed: Secondary | ICD-10-CM

## 2022-01-19 NOTE — Telephone Encounter (Signed)
Home Health Verbal Orders - Caller/Agency: [facility] ? ?Callback Number: [id number] ? ?Requesting OT/PT/Skilled Nursing/Social Work/Speech Therapy: Nursing ? ?Frequency: 62m ?

## 2022-01-19 NOTE — Telephone Encounter (Signed)
OK for verbals 

## 2022-01-19 NOTE — Plan of Care (Signed)
?  Problem: Reduce overall frequency, intensity, and duration of the anxiety so that daily functioning is not impaired ?Goal: LTG: Patient will score less than 5 on the Generalized Anxiety Disorder 7 Scale (GAD-7) ?Outcome: Progressing ?Goal: STG: Patient will participate in at least 80% of scheduled individual psychotherapy sessions ?Outcome: Progressing ?Intervention: Encourage new environment or opportunities for social interaction ?Intervention: Encourage verbalization of feelings/concerns/expectations ?Intervention: Encourage patient to identify triggers ?Intervention: Encourage self-care activities ?Intervention: Encourage family support ?  ?

## 2022-01-19 NOTE — Progress Notes (Signed)
Virtual Visit via Video Note ? ?I connected with Kendra [name] on 01/19/22 at 11:00 AM EDT by a video enabled telemedicine application and verified that I am speaking with the correct person using two identifiers. ? ?Location: ?Patient: home ?Provider: remote office [place], [place]) ?  ?I discussed the limitations of evaluation and management by telemedicine and the availability of in person appointments. The patient expressed understanding and agreed to proceed. ?I discussed the assessment and treatment plan with the patient. The patient was provided an opportunity to ask questions and all were answered. The patient agreed with the plan and demonstrated an understanding of the instructions. ?  ?The patient was advised to call back or seek an in-person evaluation if the symptoms worsen or if the condition fails to improve as anticipated. ? ?I provided 30 minutes of non-face-to-face time during this encounter. ? ? ?Hadden Steig R Tangela Dolliver, LCSW ? ? ?THERAPIST PROGRESS NOTE ? ?Session Time: 28-3662H ? ?Participation Level: Active ? ?Behavioral Response: NeatAlertAnxious ? ?Type of Therapy: Individual Therapy ? ?Treatment Goals addressed: Problem: Reduce overall frequency, intensity, and duration of the anxiety so that daily functioning is not impaired ? ?Goal: LTG: Patient will score less than 5 on the Generalized Anxiety Disorder 7 Scale (GAD-7) ?Outcome: Progressing ? ?Goal: STG: Patient will participate in at least 80% of scheduled individual psychotherapy sessions ?Outcome: Progressing ? ?ProgressTowards Goals: Progressing ? ?Interventions: Supportive ?Intervention: Encourage new environment or opportunities for social interaction ? ?Intervention: Encourage verbalization of feelings/concerns/expectations ? ?Intervention: Encourage patient to identify triggers ? ?Intervention: Encourage self-care activities ?Intervention: Encourage family support ? ? ?Summary: Kendra [name] is a 76 y.o. female who presents with  improving symptoms related to depression and anxiety. Pt reports overall mood is stable and that she is managing stress and anxiety well. Pt reports that husband is good support system. Pt reports appetite WNL and that she is getting good quality and quantity of sleep. Pt reports no emotional outbursts.  ? ?Allowed pt to explore and express thoughts and feelings associated with recent life situations and external stressors. Pt having a harder time or a bad day articulating thoughts when asked open ended questions. Pt seemed to get anxious and/or frustrated when she would lose her train of thought. LCSW clinician was empathetic to this and would be supportive in the moment. Initially pt did not remember who clinician was or "why do we need to talk about things".  Reviewed treatment goals w/ pt and discussed overall progress.  ? ?Encouraged pt to continue with cognitive stimulating recreational activities and social interaction. Pt states that she has not left the house much since last session with the exception of appointments.  ? ?Continued recommendations are as follows: self care behaviors, positive social engagements, focusing on overall work/home/life balance, and focusing on positive physical and emotional wellness.  ?.  ? ?Suicidal/Homicidal: No ? ?Therapist Response: Kendra [name] was having a difficult time navigating the conversation and had to be redirected multiple times. Due to her continuing progress in managing mood and stress, pt was offered prn counseling in the future. Pt declined and wanted to schedule in two months. Accommodated pts need and scheduled appt. ? ?Plan: Return again in 8 weeks. ? ?Diagnosis: Bipolar disorder, in full remission, most recent episode depressed ([place]) ? ?GAD (generalized anxiety disorder) ? ?Collaboration of Care: Other pt to continue care with psychiatrist of record,Dr. Shea Evans ? ?Patient/Guardian was advised Release of Information must be obtained prior to any record release in  order to collaborate their care  with an outside provider. Patient/Guardian was advised if they have not already done so to contact the registration department to sign all necessary forms in order for [place] to release information regarding their care.  ? ?Consent: Patient/Guardian gives verbal consent for treatment and assignment of benefits for services provided during this visit. Patient/Guardian expressed understanding and agreed to proceed.  ? ?Kendra [name] R Leocadia Idleman, LCSW ?01/19/2022 ? ?

## 2022-01-19 NOTE — Telephone Encounter (Signed)
Tonya advised. 

## 2022-01-27 ENCOUNTER — Other Ambulatory Visit: Payer: Self-pay | Admitting: Psychiatry

## 2022-01-27 ENCOUNTER — Telehealth: Payer: Self-pay

## 2022-01-27 DIAGNOSIS — F3176 Bipolar disorder, in full remission, most recent episode depressed: Secondary | ICD-10-CM

## 2022-01-27 DIAGNOSIS — F411 Generalized anxiety disorder: Secondary | ICD-10-CM

## 2022-01-27 DIAGNOSIS — F5105 Insomnia due to other mental disorder: Secondary | ICD-10-CM

## 2022-01-27 MED ORDER — TRAZODONE HCL 100 MG PO TABS: 100.0000 mg | ORAL_TABLET | Freq: Every day | ORAL | 1 refills | Status: DC

## 2022-01-27 MED ORDER — RISPERIDONE 0.5 MG PO TABS: 0.5000 mg | ORAL_TABLET | ORAL | 1 refills | Status: DC

## 2022-01-27 NOTE — Telephone Encounter (Signed)
pt needs refills on the risperidone ?

## 2022-01-27 NOTE — Telephone Encounter (Signed)
I have sent risperidone to her pharmacy. ?

## 2022-01-29 ENCOUNTER — Encounter: Payer: Self-pay | Admitting: Radiation Oncology

## 2022-01-29 ENCOUNTER — Ambulatory Visit
Admission: RE | Admit: 2022-01-29 | Discharge: 2022-01-29 | Disposition: A | Discharge status: Home / Self Care | Payer: Medicare PPO | Source: Ambulatory Visit | Attending: Radiation Oncology | Admitting: Radiation Oncology

## 2022-01-29 VITALS — BP 156/76 | HR 92 | Resp 16 | Ht 66.0 in | Wt 221.3 lb

## 2022-01-29 DIAGNOSIS — Z08 Encounter for follow-up examination after completed treatment for malignant neoplasm: Secondary | ICD-10-CM | POA: Diagnosis not present

## 2022-01-29 DIAGNOSIS — Z79811 Long term (current) use of aromatase inhibitors: Secondary | ICD-10-CM | POA: Insufficient documentation

## 2022-01-29 DIAGNOSIS — Z853 Personal history of malignant neoplasm of breast: Secondary | ICD-10-CM | POA: Diagnosis not present

## 2022-01-29 DIAGNOSIS — C50411 Malignant neoplasm of upper-outer quadrant of right female breast: Secondary | ICD-10-CM | POA: Diagnosis not present

## 2022-01-29 DIAGNOSIS — Z923 Personal history of irradiation: Secondary | ICD-10-CM | POA: Insufficient documentation

## 2022-01-29 DIAGNOSIS — Z170 Estrogen receptor positive status [ER+]: Secondary | ICD-10-CM | POA: Diagnosis not present

## 2022-01-29 NOTE — Progress Notes (Signed)
Radiation Oncology ?Follow up Note ? ?Name: Kendra [name]   ?Date:   01/29/2022 ?MRN:  [id number] ?DOB: Mar 21, 1946  ? ? ?This 76 y.o. female presents to the clinic today for 3-1/2-year follow-up status post whole breast radiation to right breast for stage IIa invasive mammary carcinoma ER/PR positive. ? ?REFERRING PROVIDER: Virginia Crews, MD ? ?HPI: Patient is a 76 year old female now out over 3-1/2 years having completed whole breast radiation to her right breast for stage IIa invasive mammary carcinoma.  Seen today in routine follow-up she is doing well she does occasionally have some tenderness in the breast most likely related to scar tissue..She had mammograms back in August which I have reviewed were BI-RADS 1 negative.  She is currently on Femara tolerating that well without side effects. ? ?COMPLICATIONS OF TREATMENT: none ? ?FOLLOW UP COMPLIANCE: keeps appointments  ? ?PHYSICAL EXAM:  ?BP (!) 156/76 (BP Location: Left Arm)   Pulse 92   Resp 16   Ht '5\' 6"'$  (1.676 m)   Wt 221 lb 4.8 oz (100.4 kg)   BMI 35.72 kg/m?  ?Lungs are clear to A&P cardiac examination essentially unremarkable with regular rate and rhythm. No dominant mass or nodularity is noted in either breast in 2 positions examined. Incision is well-healed. No axillary or supraclavicular adenopathy is appreciated. Cosmetic result is excellent.  Well-developed well-nourished patient in NAD. HEENT reveals PERLA, EOMI, discs not visualized.  Oral cavity is clear. No oral mucosal lesions are identified. Neck is clear without evidence of cervical or supraclavicular adenopathy. Lungs are clear to A&P. Cardiac examination is essentially unremarkable with regular rate and rhythm without murmur rub or thrill. Abdomen is benign with no organomegaly or masses noted. Motor sensory and DTR levels are equal and symmetric in the upper and lower extremities. Cranial nerves II through XII are grossly intact. Proprioception is intact. No peripheral  adenopathy or edema is identified. No motor or sensory levels are noted. Crude visual fields are within normal range. ? ?RADIOLOGY RESULTS: Mammograms reviewed compatible with above-stated findings ? ?PLAN: Present time patient is doing well with no evidence of disease now out 3-1/2 years.  I will see her back 1 more time in a year and then discontinue follow-up care.  She continues on Femara without side effect.  Patient knows to call with any concerns. ? ?I would like to take this opportunity to thank you for allowing me to participate in the care of your patient.. ?  ? Noreene Filbert, MD ? ?

## 2022-01-31 ENCOUNTER — Other Ambulatory Visit: Payer: Self-pay | Admitting: Psychiatry

## 2022-01-31 DIAGNOSIS — F3176 Bipolar disorder, in full remission, most recent episode depressed: Secondary | ICD-10-CM

## 2022-02-02 NOTE — Progress Notes (Signed)
PROVIDER NOTE: Information contained herein reflects review and annotations entered in association with encounter. Interpretation of such information and data should be left to medically-trained personnel. Information provided to patient can be located elsewhere in the medical record under "Patient Instructions". Document created using STT-dictation technology, any transcriptional errors that may result from process are unintentional.  ?  ?Patient: Kendra [name]  Service Category: E/M  Provider: Gaspar Cola, MD  ?DOB: 18-Mar-1946  DOS: 02/04/2022  Specialty: Interventional Pain Management  ?MRN: [id number]  Setting: Ambulatory outpatient  PCP: Virginia Crews, MD  ?Type: Established Patient    Referring Provider: Virginia Crews, MD  ?Location: Office  Delivery: Face-to-face    ? ?Primary Reason(s) for Visit: Encounter for evaluation before starting new chronic pain management plan of care (Level of risk: moderate) ?CC: Back Pain (lower) ? ?HPI  ?Kendra [name] is a 76 y.o. year old, female patient, who comes today for a follow-up evaluation to review the test results and decide on a treatment plan. She has History of psychosis; History of insomnia; Malignant neoplasm of upper-outer quadrant of right breast in female, estrogen receptor positive ([place]); Malignant neoplasm of upper-inner quadrant of right breast in female, estrogen receptor positive ([place]); Family history of breast cancer; Family history of lung cancer; Genetic testing; Incisional hernia, without obstruction or gangrene; B12 deficiency; Adjustment disorder with anxious mood; Essential hypertension; High serum carbohydrate antigen 19-9 (CA19-9); MDD (major depressive disorder), severe ([place]); GAD (generalized anxiety disorder); Elevated tumor markers; Insomnia due to mental condition; Bipolar I disorder, most recent episode depressed ([place]); Alcohol use disorder, moderate, in sustained remission ([place]); Vitamin D insufficiency; Bipolar  disorder, in full remission, most recent episode depressed ([place]); Mucinous cystadenoma; Parkinson disease ([place]); Obesity; Osteopenia of neck of femur (Right); Elevated blood pressure reading; Chronic low back pain (1ry area of Pain) (Bilateral) (L>R) w/o sciatica; Polypharmacy; Other hyperlipidemia; Chronic pain syndrome; High risk medication use; Disorder of skeletal system; Problems influencing health status; Chronic hip pain (3ry area of Pain) (Bilateral) (L>R); Chronic lower extremity pain (2ry area of Pain) (Bilateral) (L>R); Abnormal MRI, lumbar spine (01/31/2020); Grade 1 Anterolisthesis of lumbar spine (L3/L4) & (5 mm) (L4/L5); Erythema ab igne; Allergic conjunctivitis of both eyes; Chronic low back pain of over 3 months duration; Osteoarthritis of  AC (acromioclavicular) joint (Left); Osteoarthritis of hip (Right); Impaction fracture of distal end of radius and ulna, sequela (Left); Osteoarthritis of 1st (thumb) carpometacarpal (CMC) joint of hand (Left); Lumbosacral facet arthropathy (Multilevel) (Bilateral) (T12-S1); Lumbar central spinal stenosis w/o neurogenic claudication (L2-3, L3-4, L4-5); Lumbar foraminal stenosis (Bilateral: L2-3, L4-5) (Right: (Severe) L4-5); Lumbar lateral recess stenosis (Bilateral: L2-3, L3-4, and L4-5) (Severe); DDD (degenerative disc disease), lumbar; Lumbosacral facet syndrome (Multilevel) (Bilateral); and Osteoarthritis of lumbar spine on their problem list. Her primarily concern today is the Back Pain (lower) ? ?Pain Assessment: ?Location: Left Back ?Radiating: pain radiaties down her left hip to the back of her knee ?Onset: More than a month ago ?Duration: Chronic pain ?Quality: Shooting, Stabbing, Aching ?Severity: 6 /10 (subjective, self-reported pain score)  ?Effect on ADL: limits my daily activities ?Timing: Constant ?Modifying factors: Meds, heating, sit down ?BP: 107/86  HR: 86 ? ?Kendra [name] comes in today for a follow-up visit after her initial evaluation on  12/22/2021. Today we went over the results of her tests. These were explained in "L[place]man's terms". During today's appointment we went over my diagnostic impression, as well as the proposed treatment plan. ? ?Review of initial evaluation (12/22/2021): "The patient  comes into the clinic today with her husband who helped [place] with the patient's information.  According to the patient the primary area of pain is that of the hips which she identifies as being the area of the buttocks.  This hip pain is (Bilateral) (L>R).  According to the patient she has not had any hip surgery.  She has been to the [facility] department.  They have done 2 injections for this pain which they indicate did not provide her with any long-term benefit.  The treatments were done by Dr. Sharlet Salina.  On 06/27/2021 he did a bilateral L5 TFESI which he refers provided her with 75% relief of the pain.  On 08/14/2021 he went ahead and did a left-sided L5 and a left-sided S1 TFESI.  According to the patient's husband, neither one of them provided her with any long-term benefit.  The patient had an MRI of the lumbar spine done on 01/31/2020.  According to this MRI the patient has advanced multilevel degenerative changes of the lumbar spine with severe L2-3 and L3-4 canal stenosis.  In addition the patient has multilevel bilateral foraminal stenosis, moderate to severe on the right at the L4-5 level and moderate on the left at the L2-3 level.  The patient denies any pain or numbness in either groin area or the anterior thighs.  She describes the pain as being bilateral in the buttocks area and going down the posterior aspect of the upper leg to the level of the knees.  She refers the left side to be worse than the right.  She denies any pain or numbness from the knees down. ? ?Interventional therapies completed by other providers:   ?(06/27/2021) bilateral L5-S1 transforaminal ESI (75% relief) by Sharlet Salina, DO ?(08/14/2021) left L5-S1  transforaminal ESI + left S1 TFESI by Sharlet Salina, DO ? ?The patient's second area of pain is that of the lower extremities (Bilateral) (L>R).  The patient denies any prior surgeries, x-rays, or injections.  She describes the pain as going through the back of the upper leg to the level of the knee with no pain or numbness below the knees.  She denies any lower extremity weakness. ? ?Physical exam: The patient was able to toe walk and heel walk without any major problems.  She did seem to be having a little bit of weakness with heel walking with her left foot suggesting some L5 weakness.  However, she denies any pain or numbness along the L5 distribution.  Patrick maneuver was negative bilaterally for sacroiliac joint arthralgia.  The patient did indicate having some buttocks pain, suggesting the possibility of some hip joint arthralgia.  She does have some x-rays of the hips.  The left side was done on 11/28/2019 and read as negative.  However there left hip x-ray done on 11/22/2019 was positive for degenerative changes of the hip joint for both hips.  Today she did present with some decreased range of motion of both hip joints.  Hyperextension and rotation of the lumbar spine did trigger pain in the lower back from the facet joints.  This was confirmed with Lynelle Smoke maneuver.  The pain was bilateral but it seems to be worse on the left side.  Lateral bending of the lumbar spine did trigger some pain in the lower back and buttocks region suggesting foraminal stenosis." ? ?Lumbar MRI review reveals: ?(01/31/2020) LUMBAR MRI FINDINGS: ?Alignment:  5 mm grade 1 anterolisthesis L4 on L5. ?Vertebrae: Scattered small intraosseous hemangiomas, most notable  within the L2 and L4 vertebral bodies. ?Conus medullaris and cauda equina: There is bunching of the cauda equina nerve roots above the L2-3 level of stenosis. ? ?DISC LEVELS: ?T12-L1: Mild diffuse disc bulge. Mild right greater than left facet arthrosis. ?L1-L2: Mild  diffuse disc bulge and mild bilateral facet arthrosis. ?L2-L3: Diffuse disc bulge with advanced bilateral facet arthrosis and ligamentum flavum buckling resulting in severe canal stenosis, severe bilateral subarticular

## 2022-02-03 DIAGNOSIS — M48062 Spinal stenosis, lumbar region with neurogenic claudication: Secondary | ICD-10-CM | POA: Diagnosis not present

## 2022-02-03 DIAGNOSIS — M5136 Other intervertebral disc degeneration, lumbar region: Secondary | ICD-10-CM | POA: Diagnosis not present

## 2022-02-03 DIAGNOSIS — M5416 Radiculopathy, lumbar region: Secondary | ICD-10-CM | POA: Diagnosis not present

## 2022-02-04 ENCOUNTER — Ambulatory Visit: Payer: Medicare PPO | Attending: Pain Medicine | Admitting: Pain Medicine

## 2022-02-04 ENCOUNTER — Encounter: Payer: Self-pay | Admitting: Pain Medicine

## 2022-02-04 VITALS — BP 107/86 | HR 86 | Temp 97.5°F | Ht 66.0 in | Wt 221.0 lb

## 2022-02-04 DIAGNOSIS — M1611 Unilateral primary osteoarthritis, right hip: Secondary | ICD-10-CM | POA: Insufficient documentation

## 2022-02-04 DIAGNOSIS — G894 Chronic pain syndrome: Secondary | ICD-10-CM | POA: Insufficient documentation

## 2022-02-04 DIAGNOSIS — M79605 Pain in left leg: Secondary | ICD-10-CM | POA: Diagnosis not present

## 2022-02-04 DIAGNOSIS — R937 Abnormal findings on diagnostic imaging of other parts of musculoskeletal system: Secondary | ICD-10-CM | POA: Diagnosis not present

## 2022-02-04 DIAGNOSIS — M47817 Spondylosis without myelopathy or radiculopathy, lumbosacral region: Secondary | ICD-10-CM | POA: Insufficient documentation

## 2022-02-04 DIAGNOSIS — G8929 Other chronic pain: Secondary | ICD-10-CM | POA: Insufficient documentation

## 2022-02-04 DIAGNOSIS — S52502S Unspecified fracture of the lower end of left radius, sequela: Secondary | ICD-10-CM | POA: Insufficient documentation

## 2022-02-04 DIAGNOSIS — M25551 Pain in right hip: Secondary | ICD-10-CM | POA: Insufficient documentation

## 2022-02-04 DIAGNOSIS — M1812 Unilateral primary osteoarthritis of first carpometacarpal joint, left hand: Secondary | ICD-10-CM | POA: Insufficient documentation

## 2022-02-04 DIAGNOSIS — M5136 Other intervertebral disc degeneration, lumbar region: Secondary | ICD-10-CM | POA: Insufficient documentation

## 2022-02-04 DIAGNOSIS — M19012 Primary osteoarthritis, left shoulder: Secondary | ICD-10-CM | POA: Insufficient documentation

## 2022-02-04 DIAGNOSIS — M4316 Spondylolisthesis, lumbar region: Secondary | ICD-10-CM | POA: Insufficient documentation

## 2022-02-04 DIAGNOSIS — M47816 Spondylosis without myelopathy or radiculopathy, lumbar region: Secondary | ICD-10-CM | POA: Insufficient documentation

## 2022-02-04 DIAGNOSIS — M48061 Spinal stenosis, lumbar region without neurogenic claudication: Secondary | ICD-10-CM | POA: Diagnosis not present

## 2022-02-04 DIAGNOSIS — M545 Low back pain, unspecified: Secondary | ICD-10-CM | POA: Diagnosis not present

## 2022-02-04 DIAGNOSIS — M79604 Pain in right leg: Secondary | ICD-10-CM | POA: Insufficient documentation

## 2022-02-04 DIAGNOSIS — M25552 Pain in left hip: Secondary | ICD-10-CM | POA: Diagnosis present

## 2022-02-04 NOTE — Progress Notes (Signed)
Safety precautions to be maintained throughout the outpatient stay will include: orient to surroundings, keep bed in low position, maintain call bell within reach at all times, provide assistance with transfer out of bed and ambulation.  

## 2022-02-04 NOTE — Patient Instructions (Signed)
______________________________________________________________________ ? ?Preparing for Procedure with Sedation ? ?NOTICE: Due to recent regulatory changes, starting on June 02, 2021, procedures requiring intravenous (IV) sedation will no longer be performed at the [facility].  These types of procedures are required to be performed at [facility] ambulatory surgery facility.  We are very sorry for the inconvenience. ? ?Procedure appointments are limited to planned procedures: ?No Prescription Refills. ?No disability issues will be discussed. ?No medication changes will be discussed. ? ?Instructions: ?Oral Intake: Do not eat or drink anything for at least 8 hours prior to your procedure. (Exception: Blood Pressure Medication. See below.) ?Transportation: A driver is required. You may not drive yourself after the procedure. ?Blood Pressure Medicine: Do not forget to take your blood pressure medicine with a sip of water the morning of the procedure. If your Diastolic (lower reading) is above 100 mmHg, elective cases will be cancelled/rescheduled. ?Blood thinners: These will need to be stopped for procedures. Notify our staff if you are taking any blood thinners. Depending on which one you take, there will be specific instructions on how and when to stop it. ?Diabetics on insulin: Notify the staff so that you can be scheduled 1st case in the morning. If your diabetes requires high dose insulin, take only ? of your normal insulin dose the morning of the procedure and notify the staff that you have done so. ?Preventing infections: Shower with an antibacterial soap the morning of your procedure. ?Build-up your immune system: Take 1000 mg of Vitamin C with every meal (3 times a day) the day prior to your procedure. ?Antibiotics: Inform the staff if you have a condition or reason that requires you to take antibiotics before dental procedures. ?Pregnancy: If you are pregnant, call and cancel the procedure. ?Sickness: If  you have a cold, fever, or any active infections, call and cancel the procedure. ?Arrival: You must be in the facility at least 30 minutes prior to your scheduled procedure. ?Children: Do not bring children with you. ?Dress appropriately: Bring dark clothing that you would not mind if they get stained. ?Valuables: Do not bring any jewelry or valuables. ? ?Reasons to call and reschedule or cancel your procedure: (Following these recommendations will minimize the risk of a serious complication.) ?Surgeries: Avoid having procedures within 2 weeks of any surgery. (Avoid for 2 weeks before or after any surgery). ?Flu Shots: Avoid having procedures within 2 weeks of a flu shots. (Avoid for 2 weeks before or after immunizations). ?Barium: Avoid having a procedure within 7-10 days after having had a radiological study involving the use of radiological contrast. (Myelograms, Barium swallow or enema study). ?Heart attacks: Avoid any elective procedures or surgeries for the initial 6 months after a "Myocardial Infarction" (Heart Attack). ?Blood thinners: It is imperative that you stop these medications before procedures. Let us know if you if you take any blood thinner.  ?Infection: Avoid procedures during or within two weeks of an infection (including chest colds or gastrointestinal problems). Symptoms associated with infections include: Localized redness, fever, chills, night sweats or profuse sweating, burning sensation when voiding, cough, congestion, stuffiness, runny nose, sore throat, diarrhea, nausea, vomiting, cold or Flu symptoms, recent or current infections. It is specially important if the infection is over the area that we intend to treat. ?Heart and lung problems: Symptoms that may suggest an active cardiopulmonary problem include: cough, chest pain, breathing difficulties or shortness of breath, dizziness, ankle swelling, uncontrolled high or unusually low blood pressure, and/or palpitations. If you are    experiencing any of these symptoms, cancel your procedure and contact your primary care physician for an evaluation. ? ?Remember:  ?Regular Business hours are:  ?Monday to Thursday 8:00 AM to 4:00 PM ? ?Provider's Schedule: ?Matayah Reyburn, MD:  ?Procedure days: Tuesday and Thursday 7:30 AM to 4:00 PM ? ?Bilal Lateef, MD:  ?Procedure days: Monday and Wednesday 7:30 AM to 4:00 PM ?______________________________________________________________________ ? ____________________________________________________________________________________________ ? ?General Risks and Possible Complications ? ?Patient Responsibilities: It is important that you read this as it is part of your informed consent. It is our duty to inform you of the risks and possible complications associated with treatments offered to you. It is your responsibility as a patient to read this and to ask questions about anything that is not clear or that you believe was not covered in this document. ? ?Patient?s Rights: You have the right to refuse treatment. You also have the right to change your mind, even after initially having agreed to have the treatment done. However, under this last option, if you wait until the last second to change your mind, you may be charged for the materials used up to that point. ? ?Introduction: Medicine is not an exact science. Everything in Medicine, including the lack of treatment(s), carries the potential for danger, harm, or loss (which is by definition: Risk). In Medicine, a complication is a secondary problem, condition, or disease that can aggravate an already existing one. All treatments carry the risk of possible complications. The fact that a side effects or complications occurs, does not imply that the treatment was conducted incorrectly. It must be clearly understood that these can happen even when everything is done following the highest safety standards. ? ?No treatment: You can choose not to proceed with the  proposed treatment alternative. The ?PRO(s)? would include: avoiding the risk of complications associated with the therapy. The ?CON(s)? would include: not getting any of the treatment benefits. These benefits fall under one of three categories: diagnostic; therapeutic; and/or palliative. Diagnostic benefits include: getting information which can ultimately lead to improvement of the disease or symptom(s). Therapeutic benefits are those associated with the successful treatment of the disease. Finally, palliative benefits are those related to the decrease of the primary symptoms, without necessarily curing the condition (example: decreasing the pain from a flare-up of a chronic condition, such as incurable terminal cancer). ? ?General Risks and Complications: These are associated to most interventional treatments. They can occur alone, or in combination. They fall under one of the following six (6) categories: no benefit or worsening of symptoms; bleeding; infection; nerve damage; allergic reactions; and/or death. ?No benefits or worsening of symptoms: In Medicine there are no guarantees, only probabilities. No healthcare provider can ever guarantee that a medical treatment will work, they can only state the probability that it may. Furthermore, there is always the possibility that the condition may worsen, either directly, or indirectly, as a consequence of the treatment. ?Bleeding: This is more common if the patient is taking a blood thinner, either prescription or over the counter (example: Goody Powders, Fish oil, Aspirin, Garlic, etc.), or if suffering a condition associated with impaired coagulation (example: Hemophilia, cirrhosis of the liver, low platelet counts, etc.). However, even if you do not have one on these, it can still happen. If you have any of these conditions, or take one of these drugs, make sure to notify your treating physician. ?Infection: This is more common in patients with a compromised  immune system, either due to disease (example:   diabetes, cancer, human immunodeficiency virus [HIV], etc.), or due to medications or treatments (example: therapies used to treat cancer and rheumatological diseas

## 2022-02-19 ENCOUNTER — Other Ambulatory Visit: Payer: Self-pay | Admitting: *Deleted

## 2022-02-19 ENCOUNTER — Telehealth: Payer: Self-pay

## 2022-02-19 ENCOUNTER — Other Ambulatory Visit: Payer: Self-pay | Admitting: Family Medicine

## 2022-02-19 NOTE — Telephone Encounter (Signed)
Requested medication (s) are due for refill today - unsure ? ?Requested medication (s) are on the active medication list -yes ? ?Future visit scheduled -yes ? ?Last refill: 07/18/21 ? ?Notes to clinic: Request RF: non delegated Rx, historical provider ? ?Requested Prescriptions  ?Pending Prescriptions Disp Refills  ? traMADol (ULTRAM) 50 MG tablet [Pharmacy Med Name: traMADol HCL '50MG'$  TABLET] 15 tablet   ?  Sig: TAKE ONE TABLET BY MOUTH EVERY 8 HOURS AS NEEDED FOR UP TO 5 DAYS  ?  ? Not Delegated - Analgesics:  Opioid Agonists Failed - 02/19/2022 10:02 AM  ?  ?  Failed - This refill cannot be delegated  ?  ?  Failed - Urine Drug Screen completed in last 360 days  ?  ?  Passed - Valid encounter within last 3 months  ?  Recent Outpatient Visits   ? ?      ? 1 month ago Chronic bilateral low back pain, unspecified whether sciatica present  ? [facility] Bacigalupo, Dionne Bucy, MD  ? 4 months ago Encounter for annual wellness visit (AWV) in Medicare patient  ? [facility] [place], Dionne Bucy, MD  ? 6 months ago Essential hypertension  ? [facility] [place], Dionne Bucy, MD  ? 8 months ago Parkinson disease [facility])  ? [facility] Gwyneth Sprout, FNP  ? 9 months ago Chronic bilateral low back pain, unspecified whether sciatica present  ? [facility] Bacigalupo, Dionne Bucy, MD  ? ?  ?  ?Future Appointments   ? ?        ? In 2 months Bacigalupo, Dionne Bucy, MD [facility]  ? ?  ? ? ?  ?  ?  ? ? ? ?Requested Prescriptions  ?Pending Prescriptions Disp Refills  ? traMADol (ULTRAM) 50 MG tablet [Pharmacy Med Name: traMADol HCL '50MG'$  TABLET] 15 tablet   ?  Sig: TAKE ONE TABLET BY MOUTH EVERY 8 HOURS AS NEEDED FOR UP TO 5 DAYS  ?  ? Not Delegated - Analgesics:  Opioid Agonists Failed - 02/19/2022 10:02 AM  ?  ?  Failed - This refill cannot be delegated  ?  ?  Failed - Urine Drug Screen completed in last 360 days  ?  ?  Passed - Valid encounter  within last 3 months  ?  Recent Outpatient Visits   ? ?      ? 1 month ago Chronic bilateral low back pain, unspecified whether sciatica present  ? [facility] Bacigalupo, Dionne Bucy, MD  ? 4 months ago Encounter for annual wellness visit (AWV) in Medicare patient  ? [facility] [place], Dionne Bucy, MD  ? 6 months ago Essential hypertension  ? [facility] [place], Dionne Bucy, MD  ? 8 months ago Parkinson disease [facility])  ? [facility] Gwyneth Sprout, FNP  ? 9 months ago Chronic bilateral low back pain, unspecified whether sciatica present  ? [facility] Bacigalupo, Dionne Bucy, MD  ? ?  ?  ?Future Appointments   ? ?        ? In 2 months Bacigalupo, Dionne Bucy, MD [facility], [facility]  ? ?  ? ? ?  ?  ?  ? ? ? ?

## 2022-02-19 NOTE — Telephone Encounter (Signed)
Copied from [place] [id number]. Topic: General - Other ?>> Feb 19, 2022  1:00 PM Pawlus, Brayton Layman A wrote: ?[employer] called in regarding a home health authorisation that was denied. Caller wanted to know if Dr B wanted to set up a peer to peer review / discussion, please advise. [phone] option 1. ?

## 2022-02-20 ENCOUNTER — Encounter: Payer: Self-pay | Admitting: Internal Medicine

## 2022-02-20 MED ORDER — NYSTATIN 100000 UNIT/GM EX POWD: 1.0000 "application " | Freq: Three times a day (TID) | CUTANEOUS | 1 refills | Status: DC

## 2022-02-22 NOTE — Progress Notes (Signed)
PROVIDER NOTE: Interpretation of information contained herein should be left to medically-trained personnel. Specific patient instructions are provided elsewhere under "Patient Instructions" section of medical record. This document was created in part using STT-dictation technology, any transcriptional errors that may result from this process are unintentional.  ?Patient: Kendra [name] ?Type: Established ?DOB: [birth year] ?MRN: [id number] ?PCP: Virginia Crews, MD  Service: Procedure ?DOS: 02/24/2022 ?Setting: Ambulatory ?Location: Ambulatory outpatient facility ?Delivery: Face-to-face Provider: Gaspar Cola, MD ?Specialty: Interventional Pain Management ?Specialty designation: 09 ?Location: Outpatient facility ?Ref. Prov.: Bacigalupo, Dionne Bucy, MD   ? ?Primary Reason for Visit: Interventional Pain Management Treatment. ?CC: No chief complaint on file. ?  ?Procedure:          ? Type: Lumbar epidural steroid injection (LESI) (interlaminar) #1    ?Laterality: Left   ?Level:  L2-3 Level.  ?Imaging: Fluoroscopic guidance ?Anesthesia: Local anesthesia (1-2% Lidocaine) ?Anxiolysis: None                 ?Sedation: None. ?DOS: 02/24/2022  ?Performed by: Gaspar Cola, MD ? ?Purpose: Diagnostic/Therapeutic ?Indications: Lumbar radicular pain of intraspinal etiology of more than 4 weeks that has failed to respond to conservative therapy and is severe enough to impact quality of life or function. ?1. Chronic low back pain (1ry area of Pain) (Bilateral) (L>R) w/o sciatica   ?2. Chronic lower extremity pain (2ry area of Pain) (Bilateral) (L>R)   ?3. DDD (degenerative disc disease), lumbar   ?4. Grade 1 Anterolisthesis of lumbar spine (L3/L4) & (5 mm) (L4/L5)   ?5. Lumbar central spinal stenosis w/o neurogenic claudication (L2-3, L3-4, L4-5)   ?6. Lumbar foraminal stenosis (Bilateral: L2-3, L4-5) (Right: (Severe) L4-5)   ?7. Lumbar lateral recess stenosis (Bilateral: L2-3, L3-4, and L4-5) (Severe)   ?8.  Lumbosacral facet arthropathy (Multilevel) (Bilateral) (T12-S1)   ?9. Abnormal MRI, lumbar spine (01/31/2020)   ?10. History of allergy to shellfish   ?11. Chronic bilateral low back pain without sciatica   ?12. Chronic pain of lower extremity, bilateral   ?13. Anterolisthesis of lumbar spine   ?14. Spinal stenosis, lumbar region, without neurogenic claudication   ?15. Foraminal stenosis of lumbar region   ?16. Stenosis of lateral recess of lumbar spine   ?17. Facet arthropathy, lumbosacral   ?18. Abnormal MRI, lumbar spine   ? ?NAS-11 Pain score:  ? Pre-procedure: 6 /10  ? Post-procedure: 0-No pain/10  ? ?  ? ?Position / Prep / Materials:  ?Position: Prone w/ head of the table raised (slight reverse trendelenburg) to facilitate breathing.  ?Prep solution: DuraPrep (Iodine Povacrylex [0.7% available iodine] and Isopropyl Alcohol, 74% w/w) ?Prep Area: Entire Posterior Lumbar Region from lower scapular tip down to mid buttocks area and from flank to flank. ?Materials:  ?Tray: Epidural tray ?Needle(s):  ?Type: Epidural needle          ?Gauge (G):  17 ?Length: Regular (3.5-in) ?Qty: 1 ? ?Pre-op H&P Assessment:  ?Kendra [name] is a 76 y.o. (year old), female patient, seen today for interventional treatment. She  has a past surgical history that includes Ovarian cyst removal; Tonsillectomy and adenoidectomy (2094); Tonsillectomy; laparotomy (N/A, 02/10/2018); Salpingoophorectomy (Bilateral, 02/10/2018); Mass excision (01/2018); Breast lumpectomy with radioactive seed and sentinel lymph node biopsy (Right, 04/27/2018); Cataract extraction w/PHACO (Left, 08/30/2019); Cataract extraction w/PHACO (Right, 09/20/2019); Breast biopsy (Right, 03/14/2018); Breast biopsy (Right, 03/14/2018); and Breast lumpectomy (Right, 04/27/2018). Kendra [name] has a current medication list which includes the following prescription(s): acetaminophen, carbidopa-levodopa, carbidopa-levodopa, cyanocobalamin, denosumab, diclofenac sodium, fluoxetine hcl,  gabapentin, letrozole, lisinopril-hydrochlorothiazide, meloxicam, nystatin, polyethylene glycol, risperidone, tramadol, and trazodone. Her primarily concern today is the No chief complaint on file. ? ?Initial Vital Signs:  ?Pulse/HCG Rate: 76ECG Heart Rate: 77 ?Temp: (!) 97.3 ?F (36.3 ?C) ?Resp: 16 ?BP: (!) 177/109 (patient with anxiety about procedure) ?SpO2: 98 % ? ?BMI: Estimated body mass index is 35.67 kg/m? as calculated from the following: ?  Height as of this encounter: '5\' 6"'$  (1.676 m). ?  Weight as of this encounter: 221 lb (100.2 kg). ? ?Risk Assessment: ?Allergies: Reviewed. She is allergic to hydroxyzine, other, and shrimp [shellfish allergy].  ?Allergy Precautions: No radiological contrast used ?Coagulopathies: Reviewed. None identified.  ?Blood-thinner therapy: None at this time ?Active Infection(s): Reviewed. None identified. Kendra [name] is afebrile ? ?Site Confirmation: Kendra [name] was asked to confirm the procedure and laterality before marking the site ?Procedure checklist: Completed ?Consent: Before the procedure and under the influence of no sedative(s), amnesic(s), or anxiolytics, the patient was informed of the treatment options, risks and possible complications. To fulfill our ethical and legal obligations, as recommended by the American Medical Association's Code of Ethics, I have informed the patient of my clinical impression; the nature and purpose of the treatment or procedure; the risks, benefits, and possible complications of the intervention; the alternatives, including doing nothing; the risk(s) and benefit(s) of the alternative treatment(s) or procedure(s); and the risk(s) and benefit(s) of doing nothing. ?The patient was provided information about the general risks and possible complications associated with the procedure. These may include, but are not limited to: failure to achieve desired goals, infection, bleeding, organ or nerve damage, allergic reactions, paralysis, and  death. ?In addition, the patient was informed of those risks and complications associated to Spine-related procedures, such as failure to decrease pain; infection (i.e.: Meningitis, epidural or intraspinal abscess); bleeding (i.e.: epidural hematoma, subarachnoid hemorrhage, or any other type of intraspinal or peri-dural bleeding); organ or nerve damage (i.e.: Any type of peripheral nerve, nerve root, or spinal cord injury) with subsequent damage to sensory, motor, and/or autonomic systems, resulting in permanent pain, numbness, and/or weakness of one or several areas of the body; allergic reactions; (i.e.: anaphylactic reaction); and/or death. ?Furthermore, the patient was informed of those risks and complications associated with the medications. These include, but are not limited to: allergic reactions (i.e.: anaphylactic or anaphylactoid reaction(s)); adrenal axis suppression; blood sugar elevation that in diabetics may result in ketoacidosis or comma; water retention that in patients with history of congestive heart failure may result in shortness of breath, pulmonary edema, and decompensation with resultant heart failure; weight gain; swelling or edema; medication-induced neural toxicity; particulate matter embolism and blood vessel occlusion with resultant organ, and/or nervous system infarction; and/or aseptic necrosis of one or more joints. ?Finally, the patient was informed that Medicine is not an exact science; therefore, there is also the possibility of unforeseen or unpredictable risks and/or possible complications that may result in a catastrophic outcome. The patient indicated having understood very clearly. We have given the patient no guarantees and we have made no promises. Enough time was given to the patient to ask questions, all of which were answered to the patient's satisfaction. Ms. Wales has indicated that she wanted to continue with the procedure. ?Attestation: I, the ordering provider,  attest that I have discussed with the patient the benefits, risks, side-effects, alternatives, likelihood of achieving goals, and potential problems during recovery for the procedure that I have provided informed con

## 2022-02-23 ENCOUNTER — Other Ambulatory Visit: Payer: Self-pay | Admitting: Family Medicine

## 2022-02-23 NOTE — Telephone Encounter (Signed)
Medication Refill - Medication: traMADol (ULTRAM) 50 MG tablet ? ?Has the patient contacted their pharmacy? Yes.   ? ?(Agent: If yes, when and what did the pharmacy advise?) Contact PCP office  ? ?Preferred Pharmacy (with phone number or street name):  ? ? ?[facility] [id number], [place] Phone:  [phone]  ?Fax:  [phone]  ?  ? ?Has the patient been seen for an appointment in the last year OR does the patient have an upcoming appointment? Yes.   ? ?Patient states previous prescribing physician  is no longer at the practice and to call PCP office ? ?Agent: Please be advised that RX refills may take up to 3 business days. We ask that you follow-up with your pharmacy. ?

## 2022-02-24 ENCOUNTER — Ambulatory Visit (HOSPITAL_BASED_OUTPATIENT_CLINIC_OR_DEPARTMENT_OTHER): Payer: Medicare PPO | Admitting: Pain Medicine

## 2022-02-24 ENCOUNTER — Ambulatory Visit
Admission: RE | Admit: 2022-02-24 | Discharge: 2022-02-24 | Disposition: A | Discharge status: Home / Self Care | Payer: Medicare PPO | Source: Ambulatory Visit | Attending: Pain Medicine | Admitting: Pain Medicine

## 2022-02-24 ENCOUNTER — Encounter: Payer: Self-pay | Admitting: Pain Medicine

## 2022-02-24 ENCOUNTER — Telehealth: Payer: Self-pay | Admitting: Family Medicine

## 2022-02-24 VITALS — BP 161/89 | HR 77 | Temp 97.0°F | Resp 16 | Ht 66.0 in | Wt 221.0 lb

## 2022-02-24 DIAGNOSIS — Z91013 Allergy to seafood: Secondary | ICD-10-CM | POA: Diagnosis present

## 2022-02-24 DIAGNOSIS — M79604 Pain in right leg: Secondary | ICD-10-CM | POA: Insufficient documentation

## 2022-02-24 DIAGNOSIS — M48061 Spinal stenosis, lumbar region without neurogenic claudication: Secondary | ICD-10-CM

## 2022-02-24 DIAGNOSIS — R937 Abnormal findings on diagnostic imaging of other parts of musculoskeletal system: Secondary | ICD-10-CM

## 2022-02-24 DIAGNOSIS — G8929 Other chronic pain: Secondary | ICD-10-CM | POA: Insufficient documentation

## 2022-02-24 DIAGNOSIS — M47817 Spondylosis without myelopathy or radiculopathy, lumbosacral region: Secondary | ICD-10-CM | POA: Diagnosis not present

## 2022-02-24 DIAGNOSIS — M545 Low back pain, unspecified: Secondary | ICD-10-CM

## 2022-02-24 DIAGNOSIS — M5136 Other intervertebral disc degeneration, lumbar region: Secondary | ICD-10-CM

## 2022-02-24 DIAGNOSIS — M4316 Spondylolisthesis, lumbar region: Secondary | ICD-10-CM

## 2022-02-24 DIAGNOSIS — M79605 Pain in left leg: Secondary | ICD-10-CM | POA: Diagnosis not present

## 2022-02-24 MED ORDER — LIDOCAINE HCL (PF) 2 % IJ SOLN
INTRAMUSCULAR | Status: AC
  Filled 2022-02-24: qty 10

## 2022-02-24 MED ORDER — MIDAZOLAM HCL 5 MG/5ML IJ SOLN
INTRAMUSCULAR | Status: AC
  Filled 2022-02-24: qty 5

## 2022-02-24 MED ORDER — LIDOCAINE HCL 2 % IJ SOLN
20.0000 mL | Freq: Once | INTRAMUSCULAR | Status: AC
  Administered 2022-02-24: 400 mg

## 2022-02-24 MED ORDER — TRIAMCINOLONE ACETONIDE 40 MG/ML IJ SUSP
40.0000 mg | Freq: Once | INTRAMUSCULAR | Status: AC
  Administered 2022-02-24: 40 mg

## 2022-02-24 MED ORDER — MIDAZOLAM HCL 5 MG/5ML IJ SOLN
0.5000 mg | Freq: Once | INTRAMUSCULAR | Status: AC
  Administered 2022-02-24: 2 mg via INTRAVENOUS

## 2022-02-24 MED ORDER — TRIAMCINOLONE ACETONIDE 40 MG/ML IJ SUSP
INTRAMUSCULAR | Status: AC
  Filled 2022-02-24: qty 1

## 2022-02-24 MED ORDER — PENTAFLUOROPROP-TETRAFLUOROETH EX AERO
INHALATION_SPRAY | Freq: Once | CUTANEOUS | Status: AC
  Administered 2022-02-24: 30 via TOPICAL

## 2022-02-24 MED ORDER — SODIUM CHLORIDE 0.9% FLUSH
2.0000 mL | Freq: Once | INTRAVENOUS | Status: AC
  Administered 2022-02-24: 2 mL

## 2022-02-24 MED ORDER — ROPIVACAINE HCL 2 MG/ML IJ SOLN
2.0000 mL | Freq: Once | INTRAMUSCULAR | Status: AC
  Administered 2022-02-24: 2 mL via EPIDURAL

## 2022-02-24 MED ORDER — SODIUM CHLORIDE (PF) 0.9 % IJ SOLN
INTRAMUSCULAR | Status: AC
Start: 2022-02-24 — End: ?
  Filled 2022-02-24: qty 10

## 2022-02-24 MED ORDER — LACTATED RINGERS IV SOLN
1000.0000 mL | Freq: Once | INTRAVENOUS | Status: AC
  Administered 2022-02-24: 1000 mL via INTRAVENOUS

## 2022-02-24 MED ORDER — ROPIVACAINE HCL 2 MG/ML IJ SOLN
INTRAMUSCULAR | Status: AC
  Filled 2022-02-24: qty 20

## 2022-02-24 NOTE — Progress Notes (Signed)
Safety precautions to be maintained throughout the outpatient stay will include: orient to surroundings, keep bed in low position, maintain call bell within reach at all times, provide assistance with transfer out of bed and ambulation.  

## 2022-02-24 NOTE — Telephone Encounter (Signed)
Copied from [place]. Topic: General - Other ?>> Feb 24, 2022  4:47 PM Tessa Lerner A wrote: ?Reason for CRM: The patient has called for an update on their previously requested traMADol (ULTRAM) 50 MG tablet [[id number]]  ? ?Please contact further when possible ?

## 2022-02-24 NOTE — Telephone Encounter (Signed)
Pt called, left VM to call office back to discuss tramadol. Medication was refused for the following reason: ? ?traMADol (ULTRAM) 50 MG tablet [Pharmacy Med Name: traMADol HCL '50MG'$  TABLET] 15 tablet  02/20/2022    ?Request refused: Medication never prescribed for the patient (was not being prescribed by our practice)   ?Sig: TAKE ONE TABLET BY MOUTH EVERY 8 HOURS AS NEEDED FOR UP TO 5 DAYS   ?Renewals ? ?Renewal provider: Virginia Crews, MD  ?   ? ? ?

## 2022-02-24 NOTE — Telephone Encounter (Signed)
Requested medication (s) are due for refill today: yes ? ?Requested medication (s) are on the active medication list: yes ? ?Last refill:  07/18/21  ? ?Future visit scheduled: yes ? ?Notes to clinic:  historical med. Pt called to f/up on refill. Attempted to call pt, LVMTCB ? ? ?  ?Requested Prescriptions  ?Pending Prescriptions Disp Refills  ? traMADol (ULTRAM) 50 MG tablet 30 tablet   ?  Sig: Take by mouth.  ?  ? Not Delegated - Analgesics:  Opioid Agonists Failed - 02/23/2022  5:56 PM  ?  ?  Failed - This refill cannot be delegated  ?  ?  Failed - Urine Drug Screen completed in last 360 days  ?  ?  Passed - Valid encounter within last 3 months  ?  Recent Outpatient Visits   ? ?      ? 2 months ago Chronic bilateral low back pain, unspecified whether sciatica present  ? [facility] Bacigalupo, Dionne Bucy, MD  ? 4 months ago Encounter for annual wellness visit (AWV) in Medicare patient  ? [facility] [place], Dionne Bucy, MD  ? 6 months ago Essential hypertension  ? [facility] [place], Dionne Bucy, MD  ? 8 months ago Parkinson disease [facility])  ? [facility] Gwyneth Sprout, FNP  ? 9 months ago Chronic bilateral low back pain, unspecified whether sciatica present  ? [facility] Bacigalupo, Dionne Bucy, MD  ? ?  ?  ?Future Appointments   ? ?        ? In 1 month Bacigalupo, Dionne Bucy, MD [facility]  ? ?  ? ? ?  ?  ?  ? ?

## 2022-02-24 NOTE — Patient Instructions (Signed)

## 2022-02-25 ENCOUNTER — Telehealth: Payer: Self-pay | Admitting: *Deleted

## 2022-02-25 NOTE — Telephone Encounter (Signed)
Spoke with [name], reports no problems post procedure. ?

## 2022-02-27 ENCOUNTER — Encounter: Payer: Self-pay | Admitting: Internal Medicine

## 2022-02-27 ENCOUNTER — Inpatient Hospital Stay: Payer: Medicare PPO | Admitting: Internal Medicine

## 2022-02-27 ENCOUNTER — Inpatient Hospital Stay: Payer: Medicare PPO | Attending: Internal Medicine

## 2022-02-27 ENCOUNTER — Inpatient Hospital Stay: Payer: Medicare PPO

## 2022-02-27 DIAGNOSIS — F419 Anxiety disorder, unspecified: Secondary | ICD-10-CM | POA: Insufficient documentation

## 2022-02-27 DIAGNOSIS — Z8249 Family history of ischemic heart disease and other diseases of the circulatory system: Secondary | ICD-10-CM | POA: Diagnosis not present

## 2022-02-27 DIAGNOSIS — G8929 Other chronic pain: Secondary | ICD-10-CM | POA: Insufficient documentation

## 2022-02-27 DIAGNOSIS — M85851 Other specified disorders of bone density and structure, right thigh: Secondary | ICD-10-CM

## 2022-02-27 DIAGNOSIS — R971 Elevated cancer antigen 125 [CA 125]: Secondary | ICD-10-CM | POA: Insufficient documentation

## 2022-02-27 DIAGNOSIS — C50411 Malignant neoplasm of upper-outer quadrant of right female breast: Secondary | ICD-10-CM

## 2022-02-27 DIAGNOSIS — Z923 Personal history of irradiation: Secondary | ICD-10-CM | POA: Diagnosis not present

## 2022-02-27 DIAGNOSIS — Z833 Family history of diabetes mellitus: Secondary | ICD-10-CM | POA: Diagnosis not present

## 2022-02-27 DIAGNOSIS — R978 Other abnormal tumor markers: Secondary | ICD-10-CM | POA: Insufficient documentation

## 2022-02-27 DIAGNOSIS — I10 Essential (primary) hypertension: Secondary | ICD-10-CM | POA: Diagnosis not present

## 2022-02-27 DIAGNOSIS — Z801 Family history of malignant neoplasm of trachea, bronchus and lung: Secondary | ICD-10-CM | POA: Insufficient documentation

## 2022-02-27 DIAGNOSIS — M8580 Other specified disorders of bone density and structure, unspecified site: Secondary | ICD-10-CM | POA: Diagnosis not present

## 2022-02-27 DIAGNOSIS — E538 Deficiency of other specified B group vitamins: Secondary | ICD-10-CM | POA: Diagnosis not present

## 2022-02-27 DIAGNOSIS — Z170 Estrogen receptor positive status [ER+]: Secondary | ICD-10-CM | POA: Diagnosis not present

## 2022-02-27 DIAGNOSIS — M549 Dorsalgia, unspecified: Secondary | ICD-10-CM | POA: Insufficient documentation

## 2022-02-27 DIAGNOSIS — G20 Parkinson's disease: Secondary | ICD-10-CM | POA: Diagnosis not present

## 2022-02-27 DIAGNOSIS — Z90721 Acquired absence of ovaries, unilateral: Secondary | ICD-10-CM | POA: Insufficient documentation

## 2022-02-27 DIAGNOSIS — F1021 Alcohol dependence, in remission: Secondary | ICD-10-CM | POA: Insufficient documentation

## 2022-02-27 DIAGNOSIS — C50211 Malignant neoplasm of upper-inner quadrant of right female breast: Secondary | ICD-10-CM | POA: Insufficient documentation

## 2022-02-27 DIAGNOSIS — Z803 Family history of malignant neoplasm of breast: Secondary | ICD-10-CM | POA: Insufficient documentation

## 2022-02-27 DIAGNOSIS — E785 Hyperlipidemia, unspecified: Secondary | ICD-10-CM | POA: Diagnosis not present

## 2022-02-27 DIAGNOSIS — Z79899 Other long term (current) drug therapy: Secondary | ICD-10-CM | POA: Diagnosis not present

## 2022-02-27 DIAGNOSIS — R5383 Other fatigue: Secondary | ICD-10-CM | POA: Diagnosis not present

## 2022-02-27 LAB — COMPREHENSIVE METABOLIC PANEL
ALT: 11 U/L (ref 0–44)
AST: 23 U/L (ref 15–41)
Albumin: 4.1 g/dL (ref 3.5–5.0)
Alkaline Phosphatase: 28 U/L — ABNORMAL LOW (ref 38–126)
Anion gap: 7 (ref 5–15)
BUN: 10 mg/dL (ref 8–23)
CO2: 25 mmol/L (ref 22–32)
Calcium: 9.1 mg/dL (ref 8.9–10.3)
Chloride: 103 mmol/L (ref 98–111)
Creatinine, Ser: 0.71 mg/dL (ref 0.44–1.00)
GFR, Estimated: >60 mL/min (ref >60)
Glucose, Bld: 127 mg/dL — ABNORMAL HIGH (ref 70–99)
Potassium: 3.5 mmol/L (ref 3.5–5.1)
Sodium: 135 mmol/L (ref 135–145)
Total Bilirubin: 0.5 mg/dL (ref 0.3–1.2)
Total Protein: 6.9 g/dL (ref 6.5–8.1)

## 2022-02-27 LAB — CBC WITH DIFFERENTIAL/PLATELET
Abs Immature Granulocytes: 0.05 10*3/uL (ref 0.00–0.07)
Basophils Absolute: 0.1 10*3/uL (ref 0.0–0.1)
Basophils Relative: 1 %
Eosinophils Absolute: 0.1 10*3/uL (ref 0.0–0.5)
Eosinophils Relative: 1 %
HCT: 41.3 % (ref 36.0–46.0)
Hemoglobin: 13.6 g/dL (ref 12.0–15.0)
Immature Granulocytes: 1 %
Lymphocytes Relative: 18 %
Lymphs Abs: 1.1 10*3/uL (ref 0.7–4.0)
MCH: 28.1 pg (ref 26.0–34.0)
MCHC: 32.9 g/dL (ref 30.0–36.0)
MCV: 85.3 fL (ref 80.0–100.0)
Monocytes Absolute: 0.6 10*3/uL (ref 0.1–1.0)
Monocytes Relative: 10 %
Neutro Abs: 4.1 10*3/uL (ref 1.7–7.7)
Neutrophils Relative %: 69 %
Platelets: 214 10*3/uL (ref 150–400)
RBC: 4.84 MIL/uL (ref 3.87–5.11)
RDW: 13.9 % (ref 11.5–15.5)
WBC: 5.9 10*3/uL (ref 4.0–10.5)
nRBC: 0 % (ref 0.0–0.2)

## 2022-02-27 MED ORDER — DENOSUMAB 60 MG/ML ~~LOC~~ SOSY
60.0000 mg | PREFILLED_SYRINGE | Freq: Once | SUBCUTANEOUS | Status: AC
  Administered 2022-02-27: 60 mg via SUBCUTANEOUS
  Filled 2022-02-27: qty 1

## 2022-02-27 NOTE — Progress Notes (Signed)
[place] ?CONSULT NOTE ? ?Patient Care Team: ?Virginia Crews, MD as PCP - General (Family Medicine) ?Noreene Filbert, MD as Referring Physician (Radiation Oncology) ?Leandrew Koyanagi, MD as Referring Physician (Ophthalmology) ?Blythe Stanford, PT (Inactive) as Physical Therapist (Physical Therapy) ?Cammie Sickle, MD as Consulting Physician (Hematology and Oncology) ? ?CHIEF COMPLAINTS/PURPOSE OF CONSULTATION: Breast cancer. ? ?Oncology History Overview Note  ?# multi-focal stage IA right breast cancer s/p right lumpectomy on 04/27/2018.  Pathology of the right lateral lesion revealed a 2.5 cm grade II invasive ductal carcinoma with intermediate grade DCIS.  The medial lesion revealed a 1.1 cm grade II invasive ductal carcinoma with intermediate grade DCIS. LVIDS was present.  Margins were negative.  Zero of 2 lymph nodes were positive. Both tumors were ER + (100%), PR + (70%), and HER-2 negative.  Ki-67 was 10%.  Pathologic stage was mT2N0. Oncotype DX revealed a recurrence score of 12 which translated to a distant recurrence of 3% at 9 years with an AI or tamxifen alone.  The absolute benefit of chemotherpay was < 1%. S/p breast radiation from 06/30/2018 - 07/20/2018.  ? ? She began Femara on 09/06/2018.  ? ? ? ?# multilocular mucinous cystadenoma: s/p exploratory laparotomy, bilateral salpingo-oophrectomy, and pelvic washings on 02/10/2018. 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  Peritoneal washings revealed atypical cells.   ?  ?Malignant neoplasm of upper-inner quadrant of right breast in female, estrogen receptor positive ([place])  ?03/14/2018 Initial Diagnosis  ? Right breast mass noted on a CT scan, on mammogram 2 separate masses were noted.  2.2 cm mass at 11 o'clock position: IDC grade 2 with DCIS, ER 95%, PR 70%, Ki-67 10%,HER-2 negative ratio 1.3; second mass 1.3 cm at 1 o'clock position: IDC grade 2, ER 100%, PR 70%, Ki-67 10%, HER-2 negative  ratio 1.6, T2N0 stage Ib clinical stage AJCC 8 ? ?  ?04/27/2018 Surgery  ? Right lumpectomy lateral: IDC grade 2, 2.5 cm, intermediate grade DCIS; right lumpectomy medial: IDC grade 2 1.1 cm, LVIDS present, intermediate grade DCIS, margins negative, 0/2 lymph nodes negative both tumors are ER PR positive HER-2 negative with a Ki-67 of 10%, T2N0 stage ? ?  ?05/06/2018 Cancer Staging  ? Staging form: Breast, AJCC 8th Edition ?- Pathologic: Stage IA (pT2(2), pN0, cM0, G2, ER+, PR+, HER2-) - Signed by Nicholas Lose, MD on 05/06/2018 ? ?  ? ? ?HISTORY OF PRESENTING ILLNESS: Patient ambulating independently.   Accompanied by husband. ? ?Kendra [name] 76 y.o.  female patient with stage I breast cancer ER/PR positive HER2 negative, Parkinson's disease chronic back pain-currently on Femara is here for follow-up. ? ?In the interim patient received injection of her back-improved back pain.  Since the injection/improvement of pain family feels that patient's Parkinson symptoms of limited mobility have improved. ? ?Denies any hot flashes.  Denies any falls.  Appetite is good.  No weight loss no nausea vomiting.  She is compliant with her Femara. ? ?Review of Systems  ?Constitutional:  Positive for malaise/fatigue. Negative for chills, diaphoresis, fever and weight loss.  ?HENT:  Negative for nosebleeds and sore throat.   ?Eyes:  Negative for double vision.  ?Respiratory:  Negative for cough, hemoptysis, sputum production, shortness of breath and wheezing.   ?Cardiovascular:  Negative for chest pain, palpitations, orthopnea and leg swelling.  ?Gastrointestinal:  Negative for abdominal pain, blood in stool, constipation, diarrhea, heartburn, melena, nausea and vomiting.  ?Genitourinary:  Negative for dysuria, frequency and urgency.  ?Musculoskeletal:  Positive for back pain and joint pain.  ?Skin: Negative.  Negative for itching and rash.  ?Neurological:  Negative for dizziness, tingling, focal weakness, weakness and headaches.   ?Endo/Heme/Allergies:  Does not bruise/bleed easily.  ?Psychiatric/Behavioral:  Negative for depression. The patient is not nervous/anxious and does not have insomnia.    ? ?MEDICAL HISTORY:  ?Past Medical History:  ?Diagnosis Date  ? Anxiety   ? Breast cancer ([place]) 03/2018  ? right breast cancer at 11:00 and 1:00  ? Bursitis   ? bilateral hips and knees  ? Complication of anesthesia   ? Depression   ? Family history of breast cancer 03/24/2018  ? Family history of lung cancer 03/24/2018  ? History of alcohol dependence ([place]) 2007  ? no ETOH; resolved since 2007  ? History of hiatal hernia   ? History of psychosis 2014  ? due to sleep disturbance  ? Hypertension   ? Parkinson disease ([place])   ? Personal history of radiation therapy 06/2018-07/2018  ? right breast ca  ? PONV (postoperative nausea and vomiting)   ? ? ?SURGICAL HISTORY: ?Past Surgical History:  ?Procedure Laterality Date  ? BREAST BIOPSY Right 03/14/2018  ? 11:00 DCIS and invasive ductal carcinoma  ? BREAST BIOPSY Right 03/14/2018  ? 1:00 Invasive ductal carcinoma  ? BREAST LUMPECTOMY Right 04/27/2018  ? lumpectomy of 11 and 1:00 cancers, clear margins, negative LN  ? BREAST LUMPECTOMY WITH RADIOACTIVE SEED AND SENTINEL LYMPH NODE BIOPSY Right 04/27/2018  ? Procedure: RIGHT BREAST LUMPECTOMY WITH RADIOACTIVE SEED X 2 AND RIGHT SENTINEL LYMPH NODE BIOPSY;  Surgeon: [name], MD;  Location: [place];  Service: General;  Laterality: Right;  ? CATARACT EXTRACTION W/PHACO Left 08/30/2019  ? Procedure: CATARACT EXTRACTION PHACO AND INTRAOCULAR LENS PLACEMENT (IOC) LEFT panoptix toric  01:20.5  12.7%  10.26;  Surgeon: Leandrew Koyanagi, MD;  Location: [place];  Service: Ophthalmology;  Laterality: Left;  ? CATARACT EXTRACTION W/PHACO Right 09/20/2019  ? Procedure: CATARACT EXTRACTION PHACO AND INTRAOCULAR LENS PLACEMENT (IOC) RIGHT 5.71  00:36.4  15.7%;  Surgeon: Leandrew Koyanagi, MD;  Location: [place];   Service: Ophthalmology;  Laterality: Right;  ? LAPAROTOMY N/A 02/10/2018  ? Procedure: EXPLORATORY LAPAROTOMY;  Surgeon: Isabel Caprice, MD;  Location: [facility];  Service: Gynecology;  Laterality: N/A;  ? MASS EXCISION  01/2018  ? abdominal  ? OVARIAN CYST REMOVAL    ? SALPINGOOPHORECTOMY Bilateral 02/10/2018  ? Procedure: BILATERAL SALPINGO OOPHORECTOMY; PERITONEAL WASHINGS;  Surgeon: Isabel Caprice, MD;  Location: [facility];  Service: Gynecology;  Laterality: Bilateral;  ? TONSILLECTOMY    ? TONSILLECTOMY AND ADENOIDECTOMY  1953  ? ? ?SOCIAL HISTORY: ?Social History  ? ?Socioeconomic History  ? Marital status: Married  ?  Spouse name: [name]  ? Number of children: 0  ? Years of education: Not on file  ? Highest education level: Some college, no degree  ?Occupational History  ? Occupation: retired Furniture conservator/restorer of education  ?Tobacco Use  ? Smoking status: Never  ? Smokeless tobacco: Never  ?Vaping Use  ? Vaping Use: Never used  ?Substance and Sexual Activity  ? Alcohol use: Not Currently  ?  Comment: alcohol dependence prior to 2007  ? Drug use: Never  ? Sexual activity: Yes  ?  Partners: Male  ?  Birth control/protection: Surgical  ?Other Topics Concern  ? Not on file  ?Social History Narrative  ? Not on file  ? ?Social Determinants of  Health  ? ?Financial Resource Strain: Not on file  ?Food Insecurity: Not on file  ?Transportation Needs: Not on file  ?Physical Activity: Not on file  ?Stress: Not on file  ?Social Connections: Not on file  ?Intimate Partner Violence: Not on file  ? ? ?FAMILY HISTORY: ?Family History  ?Problem Relation Age of Onset  ? Diabetes Mother   ? Breast cancer Mother 17  ? Hypertension Mother   ? Lung cancer Father   ?     asbestos exposure  ? Heart disease Brother 66  ? Breast cancer Cousin 70  ?     paternal cousin  ? Heart disease Paternal Grandmother 35  ? Heart disease Paternal Grandfather   ? ? ?ALLERGIES:  is allergic to hydroxyzine, other, and shrimp [shellfish  allergy]. ? ?MEDICATIONS:  ?Current Outpatient Medications  ?Medication Sig Dispense Refill  ? Acetaminophen 500 MG capsule Take 1,000 mg by mouth every 6 (six) hours as needed.     ? carbidopa-levodopa (SINEMET IR) 25-10

## 2022-02-27 NOTE — Assessment & Plan Note (Addendum)
#  multi-focal stage IA right breast cancer?s/p right lumpectomy on 04/27/2018.  ER + (100%), PR + (70%), and HER-2 negative. ?Ki-67 was 10%. ?Pathologic stage was mT2N0. Oncotype DX revealed a recurrence score of 12 which translated to a distant recurrence of 3% at 9 years with an AI or tamxifen alone. ?No chemotherapy given.  S/p breast radiation?from 06/30/2018 - 07/20/2018. ?She began Femara?on 09/06/2018. ?She is tolerating it well. AUG 2023- Mammo BIL- WNL. STABLE ?? ?# continue Femara [Nov 2024; 5 years]; tolerating well with no bleeding side effects noted.  ? ?#B12 deficiency [hx of perncious abemia]- Patient's cousin gives her B12 injections monthly. ? ??????????? ?# Osteopenia-  BMD- 06/12/2020; on Prolia- Labs today reviewed;  acceptable for treatment today.  We will repeat bone density again in August 2023. ? ?# ?PArkinson- /trermors/ gait sintabiliy- sinemet?- STABLE.  ? ?# HTN: 168/102-  ? ?# DISPOSITION: ?# Mammogram/ BMD AUG 2023 ?# prolia today ?#  RTC in 6 months for MD assessment, labs (CBC with diff, CMP, CA 27-29, CA-125) and +/- Prolia.-Dr.B ?? ?

## 2022-02-28 LAB — CA 125: Cancer Antigen (CA) 125: 8.9 U/mL (ref 0.0–38.1)

## 2022-02-28 LAB — CANCER ANTIGEN 27.29: CA 27.29: 21.5 U/mL (ref 0.0–38.6)

## 2022-03-02 ENCOUNTER — Encounter: Payer: Self-pay | Admitting: Psychiatry

## 2022-03-02 ENCOUNTER — Ambulatory Visit (INDEPENDENT_AMBULATORY_CARE_PROVIDER_SITE_OTHER): Payer: Medicare PPO | Admitting: Psychiatry

## 2022-03-02 VITALS — BP 162/88 | HR 80 | Temp 98.5°F | Wt 220.8 lb

## 2022-03-02 DIAGNOSIS — F5105 Insomnia due to other mental disorder: Secondary | ICD-10-CM | POA: Diagnosis not present

## 2022-03-02 DIAGNOSIS — E221 Hyperprolactinemia: Secondary | ICD-10-CM

## 2022-03-02 DIAGNOSIS — F3176 Bipolar disorder, in full remission, most recent episode depressed: Secondary | ICD-10-CM

## 2022-03-02 DIAGNOSIS — F411 Generalized anxiety disorder: Secondary | ICD-10-CM | POA: Diagnosis not present

## 2022-03-02 MED ORDER — RISPERIDONE 0.5 MG PO TABS: 0.5000 mg | ORAL_TABLET | Freq: Two times a day (BID) | ORAL | 1 refills | Status: DC

## 2022-03-02 MED ORDER — FLUOXETINE HCL 60 MG PO TABS: 1.0000 | ORAL_TABLET | Freq: Every day | ORAL | 1 refills | Status: DC

## 2022-03-02 NOTE — Progress Notes (Signed)
[facility] MD OP Progress Note ? ?03/02/2022 11:48 AM ?Kendra [name]  ?MRN:  [id number] ? ?Chief Complaint:  ?Chief Complaint  ?Patient presents with  ? Follow-up: 76 year old Caucasian female who has a history of bipolar disorder, GAD, insomnia, Parkinson's disease, primary was evaluated for medication management.  ? ?HPI: Kendra [name] is a 76 year old Caucasian female, married, lives in [place], has a history of bipolar disorder, GAD, insomnia, multifocal stage I right breast cancer status post lumpectomy, ovarian cyst removal, Parkinson's disease, bilateral hearing loss was evaluated in office today. ? ?Collateral information obtained from husband-[name]. ? ?Patient with memory problems, ongoing, chronic, likely due to multiple mental health problems as well as her Parkinson's disease.  Patient however was able to answer questions appropriately with some support from her husband.  Patient appeared to be alert, oriented to person, situation and the time.  Patient however seems to have short-term memory loss.  She could not answer what she had for dinner last night.  Patient reports she also has short-term memory issues when she is at home when she forgets why she walks into a certain room and for what reason.  Her husband does support her. ? ?Patient although anxious is able to cope okay.  Currently denies any significant depressive symptoms.  Reports she is able to sleep through the night although she tends to sleep 10 to 12 hours.  She does doze off during the day likely due to being on polypharmacy as well as her Parkinson's disease.  However she is able to stay motivated and active and spends her time reading, walking and so on. ? ?Patient with history of elevated prolactin level currently on a lower dose of risperidone.  Agreeable to continue to reduce risperidone further. ? ?Patient continues to follow-up with neurologist. ? ?Denies suicidality, homicidality or perceptual disturbances. ? ?As per husband they  currently have home health order in place.  Hopefully this will also help.  He is also trying to get the automatic pill dispenser .  Trying to develop a plan and to follow certain rituals morning and evening that way she can help herself by just following those rituals to stay on task. ? ?Denies any other concerns today. ? ? ?Visit Diagnosis:  ?  ICD-10-CM   ?1. Bipolar disorder, in full remission, most recent episode depressed ([place])  F31.76 risperiDONE (RISPERDAL) 0.5 MG tablet  ?  Prolactin  ?  ?2. GAD (generalized anxiety disorder)  F41.1 FLUoxetine HCl 60 MG TABS  ?  ?3. Insomnia due to mental condition  F51.05 FLUoxetine HCl 60 MG TABS  ? mood  ?  ?4. Hyperprolactinemia ([facility])  E22.1 Prolactin  ? secondary  ?  ? ? ?Past Psychiatric History: Reviewed past psychiatric history from progress note on 03/01/2019.  Past trials of risperidone, Prozac, Seroquel, Ativan ? ? ?Past Medical History:  ?Past Medical History:  ?Diagnosis Date  ? Anxiety   ? Breast cancer ([place]) 03/2018  ? right breast cancer at 11:00 and 1:00  ? Bursitis   ? bilateral hips and knees  ? Complication of anesthesia   ? Depression   ? Family history of breast cancer 03/24/2018  ? Family history of lung cancer 03/24/2018  ? History of alcohol dependence ([place]) 2007  ? no ETOH; resolved since 2007  ? History of hiatal hernia   ? History of psychosis 2014  ? due to sleep disturbance  ? Hypertension   ? Parkinson disease ([place])   ? Personal history of radiation therapy 06/2018-07/2018  ?  right breast ca  ? PONV (postoperative nausea and vomiting)   ?  ?Past Surgical History:  ?Procedure Laterality Date  ? BREAST BIOPSY Right 03/14/2018  ? 11:00 DCIS and invasive ductal carcinoma  ? BREAST BIOPSY Right 03/14/2018  ? 1:00 Invasive ductal carcinoma  ? BREAST LUMPECTOMY Right 04/27/2018  ? lumpectomy of 11 and 1:00 cancers, clear margins, negative LN  ? BREAST LUMPECTOMY WITH RADIOACTIVE SEED AND SENTINEL LYMPH NODE BIOPSY Right 04/27/2018  ? Procedure: RIGHT  BREAST LUMPECTOMY WITH RADIOACTIVE SEED X 2 AND RIGHT SENTINEL LYMPH NODE BIOPSY;  Surgeon: [name], MD;  Location: [facility];  Service: General;  Laterality: Right;  ? CATARACT EXTRACTION W/PHACO Left 08/30/2019  ? Procedure: CATARACT EXTRACTION PHACO AND INTRAOCULAR LENS PLACEMENT (IOC) LEFT panoptix toric  01:20.5  12.7%  10.26;  Surgeon: Leandrew Koyanagi, MD;  Location: [facility];  Service: Ophthalmology;  Laterality: Left;  ? CATARACT EXTRACTION W/PHACO Right 09/20/2019  ? Procedure: CATARACT EXTRACTION PHACO AND INTRAOCULAR LENS PLACEMENT (IOC) RIGHT 5.71  00:36.4  15.7%;  Surgeon: Leandrew Koyanagi, MD;  Location: [facility];  Service: Ophthalmology;  Laterality: Right;  ? LAPAROTOMY N/A 02/10/2018  ? Procedure: EXPLORATORY LAPAROTOMY;  Surgeon: Isabel Caprice, MD;  Location: [facility];  Service: Gynecology;  Laterality: N/A;  ? MASS EXCISION  01/2018  ? abdominal  ? OVARIAN CYST REMOVAL    ? SALPINGOOPHORECTOMY Bilateral 02/10/2018  ? Procedure: BILATERAL SALPINGO OOPHORECTOMY; PERITONEAL WASHINGS;  Surgeon: Isabel Caprice, MD;  Location: [facility];  Service: Gynecology;  Laterality: Bilateral;  ? TONSILLECTOMY    ? TONSILLECTOMY AND ADENOIDECTOMY  1953  ? ? ?Family Psychiatric History: Reviewed family psychiatric history from progress note on 03/01/2019. ? ?Family History:  ?Family History  ?Problem Relation Age of Onset  ? Diabetes Mother   ? Breast cancer Mother 22  ? Hypertension Mother   ? Lung cancer Father   ?     asbestos exposure  ? Heart disease Brother 54  ? Breast cancer Cousin 70  ?     paternal cousin  ? Heart disease Paternal Grandmother 84  ? Heart disease Paternal Grandfather   ? ? ?Social History: Reviewed social history from progress note on 03/01/2019. ?Social History  ? ?Socioeconomic History  ? Marital status: Married  ?  Spouse name: [name]  ? Number of children: 0  ? Years of education: Not on file  ? Highest education level: Some  college, no degree  ?Occupational History  ? Occupation: retired Furniture conservator/restorer of education  ?Tobacco Use  ? Smoking status: Never  ? Smokeless tobacco: Never  ?Vaping Use  ? Vaping Use: Never used  ?Substance and Sexual Activity  ? Alcohol use: Not Currently  ?  Comment: alcohol dependence prior to 2007  ? Drug use: Never  ? Sexual activity: Yes  ?  Partners: Male  ?  Birth control/protection: Surgical  ?Other Topics Concern  ? Not on file  ?Social History Narrative  ? Not on file  ? ?Social Determinants of Health  ? ?Financial Resource Strain: Not on file  ?Food Insecurity: Not on file  ?Transportation Needs: Not on file  ?Physical Activity: Not on file  ?Stress: Not on file  ?Social Connections: Not on file  ? ? ?Allergies:  ?Allergies  ?Allergen Reactions  ? Hydroxyzine Anaphylaxis  ?  Tongue swollen  ? Other Other (See Comments)  ?  Food poisoning   ? Shrimp [Shellfish Allergy] Other (See Comments)  ?  Food poisoning   ? ? ?Metabolic Disorder Labs: ?Lab Results  ?Component Value Date  ? HGBA1C 5.9 (H) 12/17/2021  ? ?Lab Results  ?Component Value Date  ? PROLACTIN 96.8 (H) 12/17/2021  ? PROLACTIN 101.9 06/20/2015  ? ?Lab Results  ?Component Value Date  ? CHOL 190 12/17/2021  ? TRIG 105 12/17/2021  ? HDL 58 12/17/2021  ? CHOLHDL 3.3 12/17/2021  ? LDLCALC 113 (H) 12/17/2021  ? LDLCALC 101 (H) 02/27/2021  ? ?Lab Results  ?Component Value Date  ? TSH 2.31 04/13/2019  ? TSH 2.543 07/25/2018  ? ? ?Therapeutic Level Labs: ?No results found for: LITHIUM ?No results found for: VALPROATE ?No components found for:  CBMZ ? ?Current Medications: ?Current Outpatient Medications  ?Medication Sig Dispense Refill  ? Acetaminophen 500 MG capsule Take 1,000 mg by mouth every 6 (six) hours as needed.     ? carbidopa-levodopa (SINEMET IR) 25-100 MG tablet Take 2 tablets by mouth 3 (three) times daily.    ? cyanocobalamin (,VITAMIN B-12,) 1000 MCG/ML injection INJECT 1 ML INTO THE MUSCLE EVERY 30 DAYS 3 mL 3  ?  denosumab (PROLIA) 60 MG/ML SOSY injection Inject 60 mg into the skin every 6 (six) months. takes annually    ? diclofenac Sodium (VOLTAREN) 1 % GEL Apply 4 g topically 4 (four) times daily as needed. 100 g 0  ? FLUoxetine H

## 2022-03-02 NOTE — Telephone Encounter (Signed)
I don't think we ordered home health recently - last order I see is from 04/2021. Can we clarify who the ordering provider was with the insurance company before doing peer to peer and if simply having a face to face visit and reordering would suffice.  I do see some verbal order messages, but no recent orders.

## 2022-03-03 NOTE — Telephone Encounter (Signed)
Patient was d/c 02/13/22 from [facility] due to goals met.  ?

## 2022-03-03 NOTE — Telephone Encounter (Signed)
Called [facility] and spoke to Bell Arthur who reports that this was an automatically created. And to call [facility] to ask about Home health need.  ?

## 2022-03-08 NOTE — Progress Notes (Signed)
PROVIDER NOTE: Information contained herein reflects review and annotations entered in association with encounter. Interpretation of such information and data should be left to medically-trained personnel. Information provided to patient can be located elsewhere in the medical record under "Patient Instructions". Document created using STT-dictation technology, any transcriptional errors that may result from process are unintentional.  ?  ?Patient: Kendra [name]  Service Category: E/M  Provider: Gaspar Cola, MD  ?DOB: 1946-10-31  DOS: 03/12/2022  Specialty: Interventional Pain Management  ?MRN: [id number]  Setting: Ambulatory outpatient  PCP: Virginia Crews, MD  ?Type: Established Patient    Referring Provider: Virginia Crews, MD  ?Location: Office  Delivery: Face-to-face    ? ?HPI  ?Kendra [name], a 76 y.o. year old female, is here today because of her Chronic bilateral low back pain without sciatica [M54.50, G89.29]. Kendra [name] primary complain today is Back Pain (lumbar) ?Last encounter: My last encounter with her was on 02/24/2022. ?Pertinent problems: Kendra [name] has Malignant neoplasm of upper-outer quadrant of right breast in female, estrogen receptor positive ([facility]); Malignant neoplasm of upper-inner quadrant of right breast in female, estrogen receptor positive ([place]); Mucinous cystadenoma; Parkinson disease ([place]); Osteopenia of neck of femur (Right); Chronic low back pain (1ry area of Pain) (Bilateral) (L>R) w/o sciatica; Chronic pain syndrome; Chronic hip pain (3ry area of Pain) (Bilateral) (L>R); Chronic lower extremity pain (2ry area of Pain) (Bilateral) (L>R); Abnormal MRI, lumbar spine (01/31/2020); Grade 1 Anterolisthesis of lumbar spine (L3/L4) & (5 mm) (L4/L5); Chronic low back pain of over 3 months duration; Osteoarthritis of  AC (acromioclavicular) joint (Left); Osteoarthritis of hip (Right); Impaction fracture of distal end of radius and ulna, sequela (Left);  Osteoarthritis of 1st (thumb) carpometacarpal (CMC) joint of hand (Left); Lumbosacral facet arthropathy (Multilevel) (Bilateral) (T12-S1); Lumbar central spinal stenosis w/o neurogenic claudication (L2-3, L3-4, L4-5); Lumbar foraminal stenosis (Bilateral: L2-3, L4-5) (Right: (Severe) L4-5); Lumbar lateral recess stenosis (Bilateral: L2-3, L3-4, and L4-5) (Severe); DDD (degenerative disc disease), lumbar; Lumbosacral facet syndrome (Multilevel) (Bilateral); and Osteoarthritis of lumbar spine on their pertinent problem list. ?Pain Assessment: Severity of Chronic pain is reported as a 4 /10. Location: Back Lower, Left/will radiateto left hip and leg some (not as much). Onset: More than a month ago. Quality: Discomfort, Aching, Constant. Timing: Constant. Modifying factor(s): meds, procedure, sitting down. ?Vitals:  height is '5\' 6"'$  (1.676 m) and weight is 221 lb (100.2 kg). Her temperature is 97.4 ?F (36.3 ?C) (abnormal). Her blood pressure is 137/81 and her pulse is 79. Her respiration is 16 and oxygen saturation is 99%.  ? ?Reason for encounter: post-procedure evaluation and assessment.  According to the patient she attained 100% relief of the pain for the duration of the local anesthetic and after that local anesthetic wore off then the relief went down to about a 75%. The patient refers currently having an ongoing 75% relief of her pain and the pain that remains it is still in the lower portion of the lower back at about L4-5 level.  Today I have offered them the option of repeating the injection or simply waiting to see what happens with time.  If the pain starts going back we can repeat injection.  They have chosen to go with that option and they will give [place] a call if the pain returns. ? ?Post-procedure evaluation  ? Type: Lumbar epidural steroid injection (LESI) (interlaminar) #1    ?Laterality: Left   ?Level:  L2-3 Level.  ?Imaging: Fluoroscopic guidance ?Anesthesia: Local anesthesia (1-2%  Lidocaine) ?Anxiolysis: None                 ?Sedation: None. ?DOS: 02/24/2022  ?Performed by: Gaspar Cola, MD ? ?Purpose: Diagnostic/Therapeutic ?Indications: Lumbar radicular pain of intraspinal etiology of more than 4 weeks that has failed to respond to conservative therapy and is severe enough to impact quality of life or function. ?1. Chronic low back pain (1ry area of Pain) (Bilateral) (L>R) w/o sciatica   ?2. Chronic lower extremity pain (2ry area of Pain) (Bilateral) (L>R)   ?3. DDD (degenerative disc disease), lumbar   ?4. Grade 1 Anterolisthesis of lumbar spine (L3/L4) & (5 mm) (L4/L5)   ?5. Lumbar central spinal stenosis w/o neurogenic claudication (L2-3, L3-4, L4-5)   ?6. Lumbar foraminal stenosis (Bilateral: L2-3, L4-5) (Right: (Severe) L4-5)   ?7. Lumbar lateral recess stenosis (Bilateral: L2-3, L3-4, and L4-5) (Severe)   ?8. Lumbosacral facet arthropathy (Multilevel) (Bilateral) (T12-S1)   ?9. Abnormal MRI, lumbar spine (01/31/2020)   ?10. History of allergy to shellfish   ?11. Chronic bilateral low back pain without sciatica   ?12. Chronic pain of lower extremity, bilateral   ?13. Anterolisthesis of lumbar spine   ?14. Spinal stenosis, lumbar region, without neurogenic claudication   ?15. Foraminal stenosis of lumbar region   ?16. Stenosis of lateral recess of lumbar spine   ?17. Facet arthropathy, lumbosacral   ?18. Abnormal MRI, lumbar spine   ? ?NAS-11 Pain score:  ? Pre-procedure: 6 /10  ? Post-procedure: 0-No pain/10  ? ?   ?Effectiveness:  ?Initial hour after procedure: 100 %. ?Subsequent 4-6 hours post-procedure: 100 %. ?Analgesia past initial 6 hours: 75 % (Currently). ?Ongoing improvement:  ?Analgesic: The patient currently has an ongoing 75% relief of her low back pain with the remainder 25% of the pain being in the very lower portion of the lower back on the left side. ?Function: Kendra [name] reports improvement in function ?ROM: Kendra [name] reports improvement in  ROM ? ?Pharmacotherapy Assessment  ?Analgesic: Tramadol 50 mg tablet, 1 tab p.o. twice daily ?MME/day: 10 mg/day  ? ?Monitoring: ?[place] PMP: PDMP reviewed during this encounter.       ?Pharmacotherapy: No side-effects or adverse reactions reported. ?Compliance: No problems identified. ?Effectiveness: Clinically acceptable. ? ?No notes on file  UDS:  ?Summary  ?Date Value Ref Range Status  ?12/22/2021 Note  Final  ?  Comment:  ?  ==================================================================== ?Compliance Drug Analysis, Ur ?==================================================================== ?Test                             Result       Flag       Units ? ?Drug Present and Declared for Prescription Verification ?  Tramadol                       >5155        EXPECTED   ng/mg creat ?  O-Desmethyltramadol            1528         EXPECTED   ng/mg creat ?  N-Desmethyltramadol            4768         EXPECTED   ng/mg creat ?   Source of tramadol is a prescription medication. O-desmethyltramadol ?   and N-desmethyltramadol are expected metabolites of tramadol. ? ?  Gabapentin  PRESENT      EXPECTED ?  Fluoxetine                     PRESENT      EXPECTED ?  Norfluoxetine                  PRESENT      EXPECTED ?   Norfluoxetine is an expected metabolite of fluoxetine. ? ?  Trazodone                      PRESENT      EXPECTED ?  1,3 chlorophenyl piperazine    PRESENT      EXPECTED ?   1,3-chlorophenyl piperazine is an expected metabolite of trazodone. ? ?  Risperidone                    PRESENT      EXPECTED ? ?Drug Absent but Declared for Prescription Verification ?  Acetaminophen                  Not Detected UNEXPECTED ?   Acetaminophen, as indicated in the declared medication list, is not ?   always detected even when used as directed. ? ?  Diclofenac                     Not Detected UNEXPECTED ?   Diclofenac, as indicated in the declared medication list, is not ?   always detected even when used as  directed. ? ?==================================================================== ?Test                      Result    Flag   Units      Ref Range ?  Creatinine              97               mg/dL      >=20 ?=========================================

## 2022-03-12 ENCOUNTER — Encounter: Payer: Self-pay | Admitting: Pain Medicine

## 2022-03-12 ENCOUNTER — Ambulatory Visit: Payer: Medicare PPO | Attending: Pain Medicine | Admitting: Pain Medicine

## 2022-03-12 VITALS — BP 137/81 | HR 79 | Temp 97.4°F | Resp 16 | Ht 66.0 in | Wt 221.0 lb

## 2022-03-12 DIAGNOSIS — M4316 Spondylolisthesis, lumbar region: Secondary | ICD-10-CM | POA: Insufficient documentation

## 2022-03-12 DIAGNOSIS — M5136 Other intervertebral disc degeneration, lumbar region: Secondary | ICD-10-CM | POA: Diagnosis not present

## 2022-03-12 DIAGNOSIS — M545 Low back pain, unspecified: Secondary | ICD-10-CM | POA: Diagnosis not present

## 2022-03-12 DIAGNOSIS — M79605 Pain in left leg: Secondary | ICD-10-CM | POA: Diagnosis not present

## 2022-03-12 DIAGNOSIS — M79604 Pain in right leg: Secondary | ICD-10-CM | POA: Diagnosis not present

## 2022-03-12 DIAGNOSIS — M47817 Spondylosis without myelopathy or radiculopathy, lumbosacral region: Secondary | ICD-10-CM | POA: Diagnosis not present

## 2022-03-12 DIAGNOSIS — G8929 Other chronic pain: Secondary | ICD-10-CM | POA: Insufficient documentation

## 2022-03-12 DIAGNOSIS — M48061 Spinal stenosis, lumbar region without neurogenic claudication: Secondary | ICD-10-CM | POA: Insufficient documentation

## 2022-03-12 NOTE — Patient Instructions (Signed)
______________________________________________________________________ ? ?Preparing for Procedure with Sedation ? ?NOTICE: Due to recent regulatory changes, starting on June 02, 2021, procedures requiring intravenous (IV) sedation will no longer be performed at the [facility].  These types of procedures are required to be performed at [facility] ambulatory surgery facility.  We are very sorry for the inconvenience. ? ?Procedure appointments are limited to planned procedures: ?No Prescription Refills. ?No disability issues will be discussed. ?No medication changes will be discussed. ? ?Instructions: ?Oral Intake: Do not eat or drink anything for at least 8 hours prior to your procedure. (Exception: Blood Pressure Medication. See below.) ?Transportation: A driver is required. You may not drive yourself after the procedure. ?Blood Pressure Medicine: Do not forget to take your blood pressure medicine with a sip of water the morning of the procedure. If your Diastolic (lower reading) is above 100 mmHg, elective cases will be cancelled/rescheduled. ?Blood thinners: These will need to be stopped for procedures. Notify our staff if you are taking any blood thinners. Depending on which one you take, there will be specific instructions on how and when to stop it. ?Diabetics on insulin: Notify the staff so that you can be scheduled 1st case in the morning. If your diabetes requires high dose insulin, take only ? of your normal insulin dose the morning of the procedure and notify the staff that you have done so. ?Preventing infections: Shower with an antibacterial soap the morning of your procedure. ?Build-up your immune system: Take 1000 mg of Vitamin C with every meal (3 times a day) the day prior to your procedure. ?Antibiotics: Inform the staff if you have a condition or reason that requires you to take antibiotics before dental procedures. ?Pregnancy: If you are pregnant, call and cancel the procedure. ?Sickness: If  you have a cold, fever, or any active infections, call and cancel the procedure. ?Arrival: You must be in the facility at least 30 minutes prior to your scheduled procedure. ?Children: Do not bring children with you. ?Dress appropriately: There is always the possibility that your clothing may get soiled. ?Valuables: Do not bring any jewelry or valuables. ? ?Reasons to call and reschedule or cancel your procedure: (Following these recommendations will minimize the risk of a serious complication.) ?Surgeries: Avoid having procedures within 2 weeks of any surgery. (Avoid for 2 weeks before or after any surgery). ?Flu Shots: Avoid having procedures within 2 weeks of a flu shots. (Avoid for 2 weeks before or after immunizations). ?Barium: Avoid having a procedure within 7-10 days after having had a radiological study involving the use of radiological contrast. (Myelograms, Barium swallow or enema study). ?Heart attacks: Avoid any elective procedures or surgeries for the initial 6 months after a "Myocardial Infarction" (Heart Attack). ?Blood thinners: It is imperative that you stop these medications before procedures. Let us know if you if you take any blood thinner.  ?Infection: Avoid procedures during or within two weeks of an infection (including chest colds or gastrointestinal problems). Symptoms associated with infections include: Localized redness, fever, chills, night sweats or profuse sweating, burning sensation when voiding, cough, congestion, stuffiness, runny nose, sore throat, diarrhea, nausea, vomiting, cold or Flu symptoms, recent or current infections. It is specially important if the infection is over the area that we intend to treat. ?Heart and lung problems: Symptoms that may suggest an active cardiopulmonary problem include: cough, chest pain, breathing difficulties or shortness of breath, dizziness, ankle swelling, uncontrolled high or unusually low blood pressure, and/or palpitations. If you are    experiencing any of these symptoms, cancel your procedure and contact your primary care physician for an evaluation. ? ?Remember:  ?Regular Business hours are:  ?Monday to Thursday 8:00 AM to 4:00 PM ? ?Provider's Schedule: ?Iyona Pehrson, MD:  ?Procedure days: Tuesday and Thursday 7:30 AM to 4:00 PM ? ?Bilal Lateef, MD:  ?Procedure days: Monday and Wednesday 7:30 AM to 4:00 PM ?______________________________________________________________________ ? ____________________________________________________________________________________________ ? ?General Risks and Possible Complications ? ?Patient Responsibilities: It is important that you read this as it is part of your informed consent. It is our duty to inform you of the risks and possible complications associated with treatments offered to you. It is your responsibility as a patient to read this and to ask questions about anything that is not clear or that you believe was not covered in this document. ? ?Patient?s Rights: You have the right to refuse treatment. You also have the right to change your mind, even after initially having agreed to have the treatment done. However, under this last option, if you wait until the last second to change your mind, you may be charged for the materials used up to that point. ? ?Introduction: Medicine is not an exact science. Everything in Medicine, including the lack of treatment(s), carries the potential for danger, harm, or loss (which is by definition: Risk). In Medicine, a complication is a secondary problem, condition, or disease that can aggravate an already existing one. All treatments carry the risk of possible complications. The fact that a side effects or complications occurs, does not imply that the treatment was conducted incorrectly. It must be clearly understood that these can happen even when everything is done following the highest safety standards. ? ?No treatment: You can choose not to proceed with the  proposed treatment alternative. The ?PRO(s)? would include: avoiding the risk of complications associated with the therapy. The ?CON(s)? would include: not getting any of the treatment benefits. These benefits fall under one of three categories: diagnostic; therapeutic; and/or palliative. Diagnostic benefits include: getting information which can ultimately lead to improvement of the disease or symptom(s). Therapeutic benefits are those associated with the successful treatment of the disease. Finally, palliative benefits are those related to the decrease of the primary symptoms, without necessarily curing the condition (example: decreasing the pain from a flare-up of a chronic condition, such as incurable terminal cancer). ? ?General Risks and Complications: These are associated to most interventional treatments. They can occur alone, or in combination. They fall under one of the following six (6) categories: no benefit or worsening of symptoms; bleeding; infection; nerve damage; allergic reactions; and/or death. ?No benefits or worsening of symptoms: In Medicine there are no guarantees, only probabilities. No healthcare provider can ever guarantee that a medical treatment will work, they can only state the probability that it may. Furthermore, there is always the possibility that the condition may worsen, either directly, or indirectly, as a consequence of the treatment. ?Bleeding: This is more common if the patient is taking a blood thinner, either prescription or over the counter (example: Goody Powders, Fish oil, Aspirin, Garlic, etc.), or if suffering a condition associated with impaired coagulation (example: Hemophilia, cirrhosis of the liver, low platelet counts, etc.). However, even if you do not have one on these, it can still happen. If you have any of these conditions, or take one of these drugs, make sure to notify your treating physician. ?Infection: This is more common in patients with a compromised  immune system, either due to disease (example:   diabetes, cancer, human immunodeficiency virus [HIV], etc.), or due to medications or treatments (example: therapies used to treat cancer and rheumatological dise

## 2022-03-25 DIAGNOSIS — H903 Sensorineural hearing loss, bilateral: Secondary | ICD-10-CM | POA: Diagnosis not present

## 2022-03-26 ENCOUNTER — Ambulatory Visit (INDEPENDENT_AMBULATORY_CARE_PROVIDER_SITE_OTHER): Payer: Medicare PPO | Admitting: Licensed Clinical Social Worker

## 2022-03-26 DIAGNOSIS — F411 Generalized anxiety disorder: Secondary | ICD-10-CM

## 2022-03-26 DIAGNOSIS — F3176 Bipolar disorder, in full remission, most recent episode depressed: Secondary | ICD-10-CM | POA: Diagnosis not present

## 2022-03-26 NOTE — Plan of Care (Signed)
  Problem: Reduce overall frequency, intensity, and duration of the anxiety so that daily functioning is not impaired Goal: LTG: Patient will score less than 5 on the Generalized Anxiety Disorder 7 Scale (GAD-7) Outcome: Progressing Goal: STG: Patient will participate in at least 80% of scheduled individual psychotherapy sessions Outcome: Progressing Intervention: Perform psychoeducation regarding anxiety disorders Note: reviewed Intervention: Perform motivational interviewing regarding use of tools Note: reviewed Intervention: Discuss self-management skills Note: reviewed

## 2022-03-26 NOTE — Progress Notes (Signed)
  THERAPIST PROGRESS NOTE  Session Time: [id number]  [facility] in office visit for patient and LCSW clinician  Participation Level: Active  Behavioral Response: NeatAlertAnxious  Type of Therapy: Individual Therapy  Treatment Goals addressed: Problem: Reduce overall frequency, intensity, and duration of the anxiety so that daily functioning is not impaired Goal: LTG: Patient will score less than 5 on the Generalized Anxiety Disorder 7 Scale (GAD-7) Outcome: Progressing Goal: STG: Patient will participate in at least 80% of scheduled individual psychotherapy sessions Outcome: Progressing Intervention: Perform psychoeducation regarding anxiety disorders Note: reviewed Intervention: Perform motivational interviewing regarding use of tools Note: reviewed Intervention: Discuss self-management skills Note: reviewed    ProgressTowards Goals: Progressing  Interventions: CBT, DBT, and Supportive  Summary: Shandy Checo is a 76 y.o. female who presents with improving symptoms related to depression and anxiety. Pt reports overall mood is stable and that she is managing stress and anxiety well. Pt reports that husband is good support system. Pt reports appetite WNL and that she is getting good quality and quantity of sleep. Pt reports no emotional outbursts.   Allowed pt to explore and express thoughts and feelings associated with recent life situations and external stressors. Allowed pt safe space to explore overall psychological impact of dealing with Parkinson's on a daily basis.Discussed activities that contribute to a positive quality of life on a daily basis.   Encouraged pt to continue with cognitive stimulating recreational activities and social interaction. Pt states that she has not left the house much since last session with the exception of appointments.   Continued recommendations are as follows: self care behaviors, positive social engagements, focusing on overall work/home/life  balance, and focusing on positive physical and emotional wellness.  .   Suicidal/Homicidal: No  Therapist Response: Shulamis was having a difficult time navigating the conversation and had to be redirected multiple times. Due to her continuing progress in managing mood and stress, pt was offered prn counseling in the future. Pt declined and wanted to schedule in two months. Accommodated pts need and scheduled appt.  Plan: Return again in 8 weeks.  Diagnosis: Bipolar disorder, in full remission, most recent episode depressed ([place])  GAD (generalized anxiety disorder)  Collaboration of Care: Other pt to continue care with psychiatrist of record,Dr. Shea Evans  Patient/Guardian was advised Release of Information must be obtained prior to any record release in order to collaborate their care with an outside provider. Patient/Guardian was advised if they have not already done so to contact the registration department to sign all necessary forms in order for [place] to release information regarding their care.   Consent: Patient/Guardian gives verbal consent for treatment and assignment of benefits for services provided during this visit. Patient/Guardian expressed understanding and agreed to proceed.   Dickson, LCSW 03/27/2022

## 2022-03-31 ENCOUNTER — Telehealth: Payer: Self-pay | Admitting: Family Medicine

## 2022-03-31 ENCOUNTER — Other Ambulatory Visit: Payer: Self-pay

## 2022-03-31 MED ORDER — MELOXICAM 15 MG PO TABS: 15.0000 mg | ORAL_TABLET | Freq: Every day | ORAL | 0 refills | Status: DC

## 2022-03-31 NOTE — Telephone Encounter (Signed)
[facility] faxed refill request for the following medications:  meloxicam (MOBIC) 15 MG tablet   Please advise.

## 2022-04-04 ENCOUNTER — Other Ambulatory Visit: Payer: Self-pay | Admitting: Family Medicine

## 2022-04-11 ENCOUNTER — Other Ambulatory Visit: Payer: Self-pay | Admitting: Internal Medicine

## 2022-04-11 ENCOUNTER — Other Ambulatory Visit: Payer: Self-pay | Admitting: Family Medicine

## 2022-04-11 DIAGNOSIS — C50411 Malignant neoplasm of upper-outer quadrant of right female breast: Secondary | ICD-10-CM

## 2022-04-17 NOTE — Progress Notes (Addendum)
I,Sulibeya S Dimas,acting as a Education administrator for Lavon Paganini, MD.,have documented all relevant documentation on the behalf of Lavon Paganini, MD,as directed by  Lavon Paganini, MD while in the presence of Lavon Paganini, MD.   Established patient visit   Patient: Kendra [name]   DOB: 26-May-1946   76 y.o. Female  MRN: [id number] Visit Date: 04/20/2022  Today's healthcare provider: Lavon Paganini, MD   Chief Complaint  Patient presents with  . Hypertension  . Hyperlipidemia  . Insomnia   Subjective    HPI Patient comes in for 6 months follow-up chronic disease. Hypertension, follow-up  BP Readings from Last 3 Encounters:  04/20/22 120/74  03/12/22 137/81  02/27/22 (!) 168/102   Wt Readings from Last 3 Encounters:  04/20/22 226 lb 6.4 oz (102.7 kg)  03/12/22 221 lb (100.2 kg)  02/24/22 221 lb (100.2 kg)     She was last seen for hypertension 6 months ago.  BP at that visit was 129/79. Management since that visit includes continue current medication.  She reports excellent compliance with treatment. She is not having side effects.  She is following a Regular diet. She is not exercising. She does not smoke.   Outside blood pressures are not being checked. Symptoms: No chest pain No chest pressure  No palpitations No syncope  No dyspnea No orthopnea  No paroxysmal nocturnal dyspnea Yes lower extremity edema   Pertinent labs Lab Results  Component Value Date   CHOL 190 12/17/2021   HDL 58 12/17/2021   LDLCALC 113 (H) 12/17/2021   TRIG 105 12/17/2021   CHOLHDL 3.3 12/17/2021   Lab Results  Component Value Date   NA 135 02/27/2022   K 3.5 02/27/2022   CREATININE 0.71 02/27/2022   GFRNONAA >60 02/27/2022   GLUCOSE 127 (H) 02/27/2022   TSH 2.31 04/13/2019     The 10-year ASCVD risk score (Arnett DK, et al., 2019) is: 16.8%  ---------------------------------------------------------------------------------------------------   Lipid/Cholesterol, Follow-up  Last lipid panel Other pertinent labs  Lab Results  Component Value Date   CHOL 190 12/17/2021   HDL 58 12/17/2021   LDLCALC 113 (H) 12/17/2021   TRIG 105 12/17/2021   CHOLHDL 3.3 12/17/2021   Lab Results  Component Value Date   ALT 11 02/27/2022   AST 23 02/27/2022   PLT 214 02/27/2022   TSH 2.31 04/13/2019     She was last seen for this 6 months ago.  Management since that visit includes not on Statin.  She reports excellent compliance with treatment. She is not having side effects.   Symptoms: No chest pain No chest pressure/discomfort  No dyspnea Yes lower extremity edema  No numbness or tingling of extremity No orthopnea  No palpitations No paroxysmal nocturnal dyspnea  No speech difficulty No syncope    The 10-year ASCVD risk score (Arnett DK, et al., 2019) is: 16.8%  ---------------------------------------------------------------------------------------------------  Follow up for Insomnia  The patient was last seen for this 6 months ago. Changes made at last visit include Continue trazodone On several sedating meds Trial of blue light blocking glasses before bedtime as she is watching evening TV before bedtime.  She reports excellent compliance with treatment. She feels that condition is Improved. She is not having side effects.   -----------------------------------------------------------------------------------------  Medications: Outpatient Medications Prior to Visit  Medication Sig  . Acetaminophen 500 MG capsule Take 1,000 mg by mouth every 6 (six) hours as needed.   . carbidopa-levodopa (SINEMET CR) 50-200 MG tablet Take 1  tablet by mouth at bedtime.  . carbidopa-levodopa (SINEMET IR) 25-100 MG tablet Take 1 tablet by mouth daily in the afternoon. In the morning  . cyanocobalamin (,VITAMIN B-12,) 1000 MCG/ML injection INJECT 1ML INTO THE MUSCLE EVERY 30 DAYS  . denosumab (PROLIA) 60 MG/ML SOSY injection Inject 60 mg  into the skin every 6 (six) months. takes annually  . diclofenac Sodium (VOLTAREN) 1 % GEL Apply 4 g topically 4 (four) times daily as needed.  Marland Kitchen FLUoxetine HCl 60 MG TABS Take 1 tablet by mouth daily.  Marland Kitchen gabapentin (NEURONTIN) 100 MG capsule Take 3 capsules (300 mg total) by mouth 3 (three) times daily. (Patient taking differently: Take 200 mg by mouth 3 (three) times daily.)  . letrozole (FEMARA) 2.5 MG tablet TAKE ONE TABLET BY MOUTH DAILY  . lisinopril-hydrochlorothiazide (ZESTORETIC) 20-12.5 MG tablet TAKE ONE TABLET BY MOUTH DAILY  . meloxicam (MOBIC) 15 MG tablet Take 1 tablet (15 mg total) by mouth daily.  Marland Kitchen nystatin (MYCOSTATIN/NYSTOP) powder Apply 1 application. topically 3 (three) times daily.  . polyethylene glycol (MIRALAX / GLYCOLAX) 17 g packet Take 17 g by mouth daily.  . risperiDONE (RISPERDAL) 0.5 MG tablet Take 1 tablet (0.5 mg total) by mouth 2 (two) times daily.  . traMADol (ULTRAM) 50 MG tablet Take by mouth.  . traZODone (DESYREL) 100 MG tablet Take 1 tablet (100 mg total) by mouth at bedtime.   No facility-administered medications prior to visit.    Review of Systems  Constitutional:  Positive for fatigue and unexpected weight change. Negative for activity change and appetite change.  Respiratory:  Negative for chest tightness and shortness of breath.   Cardiovascular:  Positive for leg swelling. Negative for chest pain and palpitations.       Objective    BP 120/74 (BP Location: Left Arm, Patient Position: Sitting, Cuff Size: Large)   Pulse 86   Temp 98.9 F (37.2 C) (Oral)   Resp 16   Wt 226 lb 6.4 oz (102.7 kg)   SpO2 96%   BMI 36.54 kg/m    Physical Exam Vitals reviewed.  Constitutional:      General: She is not in acute distress.    Appearance: Normal appearance. She is well-developed. She is not diaphoretic.  HENT:     Head: Normocephalic and atraumatic.  Eyes:     General: No scleral icterus.    Conjunctiva/sclera: Conjunctivae normal.   Neck:     Thyroid: No thyromegaly.  Cardiovascular:     Rate and Rhythm: Normal rate and regular rhythm.     Pulses: Normal pulses.     Heart sounds: Normal heart sounds. No murmur heard. Pulmonary:     Effort: Pulmonary effort is normal. No respiratory distress.     Breath sounds: Normal breath sounds. No wheezing, rhonchi or rales.  Musculoskeletal:     Cervical back: Neck supple.     Right lower leg: Edema present.     Left lower leg: Edema (non-pitting) present.  Lymphadenopathy:     Cervical: No cervical adenopathy.  Skin:    General: Skin is warm and dry.     Findings: No rash.  Neurological:     Mental Status: She is alert and oriented to person, place, and time. Mental status is at baseline.  Psychiatric:        Mood and Affect: Mood normal.        Behavior: Behavior normal.      No results found for any visits on 04/20/22.  Assessment & Plan     Problem List Items Addressed This Visit       Cardiovascular and Mediastinum   Essential hypertension - Primary    Well controlled Continue current medications Reviewed recent metabolic panel      Relevant Orders   Ambulatory referral to [facility]   Parkinson disease ([place]) (Chronic)    Continue to follow with neuro No changes to medications Rereferral to PT for big and loud program      Relevant Orders   Ambulatory referral to Physical Therapy   Ambulatory referral to [facility]     Other   Incisional hernia, without obstruction or gangrene    She was previously evaluated by general surgery and found to be a candidate for repair, but was unsure about whether she will repair She does want to meet with them again today risks and benefits again      Relevant Orders   Ambulatory referral to General Surgery   Insomnia due to mental condition    Chronic and well-controlled Continue trazodone at current dose      Obesity    Discussed importance of healthy weight  management Discussed diet and exercise       Relevant Orders   Ambulatory referral to [facility]   Other hyperlipidemia    Reviewed last lipid panel Not currently on a statin Reviewed recent FLP and CMP Discussed diet and exercise       Relevant Orders   Ambulatory referral to [place]   Other Visit Diagnoses     Positive colorectal cancer screening using Cologuard test       Relevant Orders   Ambulatory referral to Gastroenterology       Addendum: Patient needs in home asisstance with medication regimen related to Parkinson disease - referral to [facility] RN placed today.    Return in about 6 months (around 10/20/2022) for CPE, AWV.      I, Lavon Paganini, MD, have reviewed all documentation for this visit. The documentation on 04/23/22 for the exam, diagnosis, procedures, and orders are all accurate and complete.   Shaton Lore, Dionne Bucy, MD, MPH [facility]

## 2022-04-20 ENCOUNTER — Telehealth: Payer: Self-pay

## 2022-04-20 ENCOUNTER — Ambulatory Visit: Payer: Medicare PPO | Admitting: Family Medicine

## 2022-04-20 ENCOUNTER — Encounter: Payer: Self-pay | Admitting: Family Medicine

## 2022-04-20 VITALS — BP 120/74 | HR 86 | Temp 98.9°F | Resp 16 | Wt 226.4 lb

## 2022-04-20 DIAGNOSIS — K432 Incisional hernia without obstruction or gangrene: Secondary | ICD-10-CM | POA: Diagnosis not present

## 2022-04-20 DIAGNOSIS — Z6836 Body mass index (BMI) 36.0-36.9, adult: Secondary | ICD-10-CM

## 2022-04-20 DIAGNOSIS — F5105 Insomnia due to other mental disorder: Secondary | ICD-10-CM

## 2022-04-20 DIAGNOSIS — E669 Obesity, unspecified: Secondary | ICD-10-CM | POA: Diagnosis not present

## 2022-04-20 DIAGNOSIS — E66812 Obesity, class 2: Secondary | ICD-10-CM

## 2022-04-20 DIAGNOSIS — G20 Parkinson's disease: Secondary | ICD-10-CM

## 2022-04-20 DIAGNOSIS — R195 Other fecal abnormalities: Secondary | ICD-10-CM | POA: Diagnosis not present

## 2022-04-20 DIAGNOSIS — I10 Essential (primary) hypertension: Secondary | ICD-10-CM | POA: Diagnosis not present

## 2022-04-20 DIAGNOSIS — E7849 Other hyperlipidemia: Secondary | ICD-10-CM

## 2022-04-20 DIAGNOSIS — G20A1 Parkinson's disease without dyskinesia, without mention of fluctuations: Secondary | ICD-10-CM

## 2022-04-20 NOTE — Patient Instructions (Addendum)
Try pataday eye drops for allergy eyes The CDC recommends two doses of Shingrix (the shingles vaccine) separated by 2 to 6 months for adults age 75 years and older. I recommend checking with your insurance plan regarding coverage for this vaccine.

## 2022-04-20 NOTE — Assessment & Plan Note (Signed)
Discussed importance of healthy weight management Discussed diet and exercise  

## 2022-04-20 NOTE — Telephone Encounter (Signed)
CALLED PATIENT NO ANSWER LEFT VOICEMAIL FOR A CALL BACK ? ?

## 2022-04-20 NOTE — Assessment & Plan Note (Signed)
Chronic and well controlled Continue trazodone at current dose 

## 2022-04-20 NOTE — Assessment & Plan Note (Signed)
She was previously evaluated by general surgery and found to be a candidate for repair, but was unsure about whether she will repair She does want to meet with them again today risks and benefits again

## 2022-04-20 NOTE — Assessment & Plan Note (Signed)
Continue to follow with neuro No changes to medications Rereferral to PT for big and loud program

## 2022-04-20 NOTE — Assessment & Plan Note (Signed)
Well controlled Continue current medications Reviewed recent metabolic panel 

## 2022-04-20 NOTE — Assessment & Plan Note (Signed)
Reviewed last lipid panel Not currently on a statin Reviewed recent FLP and CMP Discussed diet and exercise

## 2022-04-21 ENCOUNTER — Telehealth: Payer: Self-pay

## 2022-04-21 NOTE — Telephone Encounter (Signed)
CALLED PATIENT NO ANSWER LEFT VOICEMAIL FOR A CALL BACK ? ?

## 2022-04-22 ENCOUNTER — Telehealth: Payer: Self-pay

## 2022-04-22 NOTE — Telephone Encounter (Signed)
CALLED PATIENT NO ANSWER LEFT VOICEMAIL FOR A CALL BACK Reminder letter sent

## 2022-04-23 ENCOUNTER — Ambulatory Visit: Payer: Medicare PPO | Admitting: Family Medicine

## 2022-04-23 ENCOUNTER — Telehealth: Payer: Self-pay

## 2022-04-23 DIAGNOSIS — H40003 Preglaucoma, unspecified, bilateral: Secondary | ICD-10-CM | POA: Diagnosis not present

## 2022-04-23 NOTE — Telephone Encounter (Signed)
Copied from [place] [phone]. Topic: General - Other >> Apr 21, 2022 11:12 AM Rudene Anda wrote: Reason for CRM: Pt was seen yesterday 6/19 and stated a referral was supposed to be sent in for home health orders, caller stated somebody Is supposed to come to the house and help her out with her medications, please advise.

## 2022-04-23 NOTE — Telephone Encounter (Signed)
This was not one of the referrals that they asked for. They had requested re-referral to surgery and PT.  If they want home health, we have to do a face-to-face and document need during that visit. We could offer a virtual for today or tomorrow if they want

## 2022-04-23 NOTE — Telephone Encounter (Signed)
Attempted to reach pt, left VM to call back. 

## 2022-04-23 NOTE — Telephone Encounter (Signed)
Ok note addended and referral placed to [facility] RN and aide

## 2022-04-24 ENCOUNTER — Encounter: Payer: Self-pay | Admitting: *Deleted

## 2022-04-24 DIAGNOSIS — G20 Parkinson's disease: Secondary | ICD-10-CM | POA: Diagnosis not present

## 2022-04-24 DIAGNOSIS — G2401 Drug induced subacute dyskinesia: Secondary | ICD-10-CM | POA: Diagnosis not present

## 2022-04-24 DIAGNOSIS — M5416 Radiculopathy, lumbar region: Secondary | ICD-10-CM | POA: Diagnosis not present

## 2022-04-24 DIAGNOSIS — F319 Bipolar disorder, unspecified: Secondary | ICD-10-CM | POA: Diagnosis not present

## 2022-04-24 NOTE — Telephone Encounter (Signed)
Spoke to Kendra [name] he reports [facility] contacted them for [facility].

## 2022-05-06 ENCOUNTER — Telehealth: Payer: Self-pay | Admitting: *Deleted

## 2022-05-06 NOTE — Telephone Encounter (Signed)
PROVIDER REQUESTED: If this is for Prolactin level , yes , she does not need new order.  Please advise to get it done LAB WORK

## 2022-05-06 NOTE — Telephone Encounter (Signed)
Dr. Eappen's pt

## 2022-05-06 NOTE — Telephone Encounter (Signed)
Patient called [facility] he just found paper work to get Lab Work from 03/02/22. Asked if he should still get the lab work done & asked if he would need new order ??

## 2022-05-11 ENCOUNTER — Telehealth: Payer: Self-pay | Admitting: Family Medicine

## 2022-05-11 DIAGNOSIS — G20 Parkinson's disease: Secondary | ICD-10-CM

## 2022-05-11 NOTE — Telephone Encounter (Signed)
Patient's husband reports Mrs. Kendra [name] is not wanting help with bathing at this time. They are only interested in the [facility] at [facility] rehab.

## 2022-05-11 NOTE — Telephone Encounter (Signed)
Attempted call. NA

## 2022-05-11 NOTE — Telephone Encounter (Signed)
Pt husband - is calling to report that the pt does need assistance bathing. Received call [facility] [place] and stated that could not work with the patient if speech and PT where involved. Please advise   Pt husband is not wanting the [facility] to be comprised at the expenses of Speech & PT Pt husband is needing some clairtity [phone]

## 2022-05-11 NOTE — Telephone Encounter (Signed)
Pt is calling to request a referral again to  To Specialty To Provider To Location/Place of Service To Department  Speech Pathology none none [facility] SERVICES  [phone]

## 2022-05-11 NOTE — Telephone Encounter (Signed)
Copied from [place] [phone]. Topic: General - Other >> May 11, 2022  2:28 PM Burman Freestone wrote: Reason for CRM: Pt's spouse [name] is requesting a referral sent to [facility] for speech therapy

## 2022-05-11 NOTE — Telephone Encounter (Signed)
New referral placed for just speech therapy to [facility] at [facility] rehab.

## 2022-05-12 DIAGNOSIS — Z79899 Other long term (current) drug therapy: Secondary | ICD-10-CM | POA: Diagnosis not present

## 2022-05-12 DIAGNOSIS — F3176 Bipolar disorder, in full remission, most recent episode depressed: Secondary | ICD-10-CM | POA: Diagnosis not present

## 2022-05-13 LAB — PROLACTIN: Prolactin: 70.5 ng/mL — ABNORMAL HIGH (ref 4.8–23.3)

## 2022-05-14 ENCOUNTER — Other Ambulatory Visit: Payer: Self-pay | Admitting: Family Medicine

## 2022-05-14 DIAGNOSIS — Z1231 Encounter for screening mammogram for malignant neoplasm of breast: Secondary | ICD-10-CM

## 2022-05-28 ENCOUNTER — Ambulatory Visit (INDEPENDENT_AMBULATORY_CARE_PROVIDER_SITE_OTHER): Payer: Medicare PPO | Admitting: Licensed Clinical Social Worker

## 2022-05-28 DIAGNOSIS — F411 Generalized anxiety disorder: Secondary | ICD-10-CM | POA: Diagnosis not present

## 2022-05-28 DIAGNOSIS — F3176 Bipolar disorder, in full remission, most recent episode depressed: Secondary | ICD-10-CM

## 2022-05-28 NOTE — Progress Notes (Signed)
Virtual Visit via Video Note  I connected with [name] on 05/28/22 at  1:00 PM EDT by a video enabled telemedicine application and verified that I am speaking with the correct person using two identifiers.  Location: Patient: home Provider: [place] Office   I discussed the limitations of evaluation and management by telemedicine and the availability of in person appointments. The patient expressed understanding and agreed to proceed.  I discussed the assessment and treatment plan with the patient. The patient was provided an opportunity to ask questions and all were answered. The patient agreed with the plan and demonstrated an understanding of the instructions.   The patient was advised to call back or seek an in-person evaluation if the symptoms worsen or if the condition fails to improve as anticipated.  I provided 20 minutes of non-face-to-face time during this encounter.   Kendra [place], LCSW  THERAPIST PROGRESS NOTE  Session Time: 1-120p  Participation Level: Active  Behavioral Response: NeatAlertAnxious  Type of Therapy: Individual Therapy  Treatment Goals addressed:  Problem: Reduce overall frequency, intensity, and duration of the anxiety so that daily functioning is not impaired Goal: LTG: Patient will score less than 5 on the Generalized Anxiety Disorder 7 Scale (GAD-7) Outcome: Progressing Goal: STG: Patient will participate in at least 80% of scheduled individual psychotherapy sessions Outcome: Progressing  ProgressTowards Goals: Progressing  Interventions: CBT, DBT, and Supportive  Summary: Kendra [name] is a 76 y.o. female who presents with improving symptoms related to depression and anxiety. Pt reports overall mood is stable and that she is managing stress and anxiety well. Pt reports that husband is good support system. Pt reports appetite WNL and that she is getting good quality and quantity of sleep. Pt reports  no emotional outbursts.   Pt reports that she does have "bad days" at times and relies on her coping skills to help her get through the day. Pt states she enjoys sitting in her sunroom--that is her "happy place". Pt reports that she also enjoys spending time with her husband , going for drives, and spending time with her friends (going to lunch or shopping).   Allowed pt to explore and express thoughts and feelings associated with recent life situations and external stressors. Allowed pt safe space to explore overall psychological impact of dealing with Parkinson's on a daily basis.Discussed activities that contribute to a positive quality of life on a daily basis.   Pt reports that she is going to be starting a new program [facility]" again.  Encouraged pt to continue with cognitive stimulating recreational activities and social interaction. Pt states that she has not left the house much since last session with the exception of appointments.   Continued recommendations are as follows: self care behaviors, positive social engagements, focusing on overall work/home/life balance, and focusing on positive physical and emotional wellness.  .   Suicidal/Homicidal: No  Therapist Response: Pt is continuing to apply interventions learned in session into daily life situations. Pt is currently on track to meet goals utilizing interventions mentioned above. Personal growth and progress noted. Treatment to continue as indicated.   Plan: Return again in 8 weeks.  Encounter Diagnoses  Name Primary?   Bipolar disorder, in full remission, most recent episode depressed ([place]) Yes   GAD (generalized anxiety disorder)     Collaboration of Care: Other pt to continue care with psychiatrist of record,Dr. Shea Evans  Patient/Guardian was advised Release of Information must be obtained prior to any record  release in order to collaborate their care with an outside provider. Patient/Guardian was advised if they have  not already done so to contact the registration department to sign all necessary forms in order for Kendra [name] to release information regarding their care.   Consent: Patient/Guardian gives verbal consent for treatment and assignment of benefits for services provided during this visit. Patient/Guardian expressed understanding and agreed to proceed.   [place], LCSW 05/28/2022

## 2022-05-28 NOTE — Plan of Care (Signed)
°  Problem: Reduce overall frequency, intensity, and duration of the anxiety so that daily functioning is not impaired. °Goal: LTG: Patient will score less than 5 on the Generalized Anxiety Disorder 7 Scale (GAD-7) °Outcome: Progressing °Goal: STG: Patient will participate in at least 80% of scheduled individual psychotherapy sessions °Outcome: Progressing °  °

## 2022-06-02 ENCOUNTER — Ambulatory Visit: Payer: Medicare PPO | Admitting: Psychiatry

## 2022-06-04 ENCOUNTER — Encounter: Payer: Self-pay | Admitting: Psychiatry

## 2022-06-04 ENCOUNTER — Ambulatory Visit (INDEPENDENT_AMBULATORY_CARE_PROVIDER_SITE_OTHER): Payer: Medicare PPO | Admitting: Psychiatry

## 2022-06-04 VITALS — BP 157/73 | HR 94 | Temp 98.1°F | Wt 225.4 lb

## 2022-06-04 DIAGNOSIS — F411 Generalized anxiety disorder: Secondary | ICD-10-CM

## 2022-06-04 DIAGNOSIS — F5105 Insomnia due to other mental disorder: Secondary | ICD-10-CM | POA: Diagnosis not present

## 2022-06-04 DIAGNOSIS — F3176 Bipolar disorder, in full remission, most recent episode depressed: Secondary | ICD-10-CM | POA: Diagnosis not present

## 2022-06-04 DIAGNOSIS — E221 Hyperprolactinemia: Secondary | ICD-10-CM

## 2022-06-04 NOTE — Patient Instructions (Signed)
  [facility] [place] Phone: [phone]  CALL us AT [phone]

## 2022-06-04 NOTE — Progress Notes (Signed)
[facility] OP Progress Note  06/04/2022 6:35 PM Kendra [name]  MRN:  [id number]  Chief Complaint:  Chief Complaint  Patient presents with   Follow-up: 76 year old Caucasian female with history of bipolar disorder, anxiety, Parkinson's disease, presented for medication management.   HPI: Kendra [name] is a 76 year old Caucasian female, married, lives in [place], has a history of bipolar disorder, GAD, insomnia, multifocal stage I right breast cancer status postlumpectomy, ovarian cyst removal, Parkinson's disease, bilateral hearing loss was evaluated in office today.  Collateral information obtained from spouse-[name].  Patient today appeared to be alert, oriented to person place situation.  Patient denies any significant depression or anxiety symptoms.  Reports sleep is overall good.  Denies any suicidality or homicidality.  Did not appear to be preoccupied with any delusions.  Denied any hallucinations.  Denies side effects to medications.  Patient has been getting support from her spouse  who helps with bathing , taking medications and so.  Per spouse patient needs a lot of prompting to do things and does not initiate activities on her own.  She does read and watch TV but outside of that she needs help.  Interested in getting home health services, agrees to discuss with primary care.  She also has difficulty ambulating,appears to slower likely due to her Parkinson's disease.  Does have upcoming neurology visit coming up.  Continues to be on carbidopa/levodopa.  Denies any other concerns today.  Visit Diagnosis:    ICD-10-CM   1. Bipolar disorder, in full remission, most recent episode depressed ([place])  F31.76     2. GAD (generalized anxiety disorder)  F41.1     3. Insomnia due to mental condition  F51.05    mood    4. Hyperprolactinemia ([place])  E22.1    Secondary to psychotropics      Past Psychiatric History: Reviewed past psychiatric history from progress note on 03/01/2019.  Past  trials of risperidone, Prozac, Seroquel, Ativan.  Past Medical History:  Past Medical History:  Diagnosis Date   Anxiety    Breast cancer ([place]) 03/2018   right breast cancer at 11:00 and 1:00   Bursitis    bilateral hips and knees   Complication of anesthesia    Depression    Family history of breast cancer 03/24/2018   Family history of lung cancer 03/24/2018   History of alcohol dependence ([place]) 2007   no ETOH; resolved since 2007   History of hiatal hernia    History of psychosis 2014   due to sleep disturbance   Hypertension    Parkinson disease ([place])    Personal history of radiation therapy 06/2018-07/2018   right breast ca   PONV (postoperative nausea and vomiting)     Past Surgical History:  Procedure Laterality Date   BREAST BIOPSY Right 03/14/2018   11:00 DCIS and invasive ductal carcinoma   BREAST BIOPSY Right 03/14/2018   1:00 Invasive ductal carcinoma   BREAST LUMPECTOMY Right 04/27/2018   lumpectomy of 11 and 1:00 cancers, clear margins, negative LN   BREAST LUMPECTOMY WITH RADIOACTIVE SEED AND SENTINEL LYMPH NODE BIOPSY Right 04/27/2018   Procedure: RIGHT BREAST LUMPECTOMY WITH RADIOACTIVE SEED X 2 AND RIGHT SENTINEL LYMPH NODE BIOPSY;  Surgeon: [name], MD;  Location: [facility];  Service: General;  Laterality: Right;   CATARACT EXTRACTION W/PHACO Left 08/30/2019   Procedure: CATARACT EXTRACTION PHACO AND INTRAOCULAR LENS PLACEMENT ([place]) LEFT panoptix toric  01:20.5  12.7%  10.26;  Surgeon: Leandrew Koyanagi, MD;  Location: [facility];  Service: Ophthalmology;  Laterality: Left;   CATARACT EXTRACTION W/PHACO Right 09/20/2019   Procedure: CATARACT EXTRACTION PHACO AND INTRAOCULAR LENS PLACEMENT ([facility]) RIGHT 5.71  00:36.4  15.7%;  Surgeon: Leandrew Koyanagi, MD;  Location: [facility];  Service: Ophthalmology;  Laterality: Right;   LAPAROTOMY N/A 02/10/2018   Procedure: EXPLORATORY LAPAROTOMY;  Surgeon: Isabel Caprice,  MD;  Location: [facility];  Service: Gynecology;  Laterality: N/A;   MASS EXCISION  01/2018   abdominal   OVARIAN CYST REMOVAL     SALPINGOOPHORECTOMY Bilateral 02/10/2018   Procedure: BILATERAL SALPINGO OOPHORECTOMY; PERITONEAL WASHINGS;  Surgeon: Isabel Caprice, MD;  Location: [facility];  Service: Gynecology;  Laterality: Bilateral;   TONSILLECTOMY     TONSILLECTOMY AND ADENOIDECTOMY  1953    Family Psychiatric History: Reviewed family psychiatric history from progress note on 03/01/2019.  Family History:  Family History  Problem Relation Age of Onset   Diabetes Mother    Breast cancer Mother 58   Hypertension Mother    Lung cancer Father        asbestos exposure   Heart disease Brother 24   Breast cancer Cousin 83       paternal cousin   Heart disease Paternal Grandmother 49   Heart disease Paternal Grandfather     Social History: Reviewed social history from progress note on 03/01/2019. Social History   Socioeconomic History   Marital status: Married    Spouse name: robert   Number of children: 0   Years of education: Not on file   Highest education level: Some college, no degree  Occupational History   Occupation: retired Furniture conservator/restorer of education  Tobacco Use   Smoking status: Never   Smokeless tobacco: Never  Vaping Use   Vaping Use: Never used  Substance and Sexual Activity   Alcohol use: Not Currently    Comment: alcohol dependence prior to 2007   Drug use: Never   Sexual activity: Yes    Partners: Male    Birth control/protection: Surgical  Other Topics Concern   Not on file  Social History Narrative   Not on file   Social Determinants of Health   Financial Resource Strain: Low Risk  (08/29/2020)   Overall Financial Resource Strain (CARDIA)    Difficulty of Paying Living Expenses: Not hard at all  Food Insecurity: No Food Insecurity (08/29/2020)   Hunger Vital Sign    Worried About Running Out of Food in the Last Year: Never true    [place] in the Last Year: Never true  Transportation Needs: No Transportation Needs (08/29/2020)   PRAPARE - Hydrologist (Medical): No    Lack of Transportation (Non-Medical): No  Physical Activity: Inactive (08/29/2020)   Exercise Vital Sign    Days of Exercise per Week: 0 days    Minutes of Exercise per Session: 0 min  Stress: Stress Concern Present (08/29/2020)   Ten Sleep    Feeling of Stress : To some extent  Social Connections: Moderately Integrated (08/29/2020)   Social Connection and Isolation Panel [NHANES]    Frequency of Communication with Friends and Family: More than three times a week    Frequency of Social Gatherings with Friends and Family: More than three times a week    Attends Religious Services: Never    Marine scientist or Organizations: Yes    Attends  Club or Organization Meetings: 1 to 4 times per year    Marital Status: Married    Allergies:  Allergies  Allergen Reactions   Hydroxyzine Anaphylaxis    Tongue swollen   Other Other (See Comments)    Food poisoning    Shrimp [Shellfish Allergy] Other (See Comments)    Food poisoning     Metabolic Disorder Labs: Lab Results  Component Value Date   HGBA1C 5.9 (H) 12/17/2021   Lab Results  Component Value Date   PROLACTIN 70.5 (H) 05/12/2022   PROLACTIN 96.8 (H) 12/17/2021   Lab Results  Component Value Date   CHOL 190 12/17/2021   TRIG 105 12/17/2021   HDL 58 12/17/2021   CHOLHDL 3.3 12/17/2021   LDLCALC 113 (H) 12/17/2021   LDLCALC 101 (H) 02/27/2021   Lab Results  Component Value Date   TSH 2.31 04/13/2019   TSH 2.543 07/25/2018    Therapeutic Level Labs: No results found for: "LITHIUM" No results found for: "VALPROATE" No results found for: "CBMZ"  Current Medications: Current Outpatient Medications  Medication Sig Dispense Refill   Acetaminophen 500 MG capsule Take 1,000 mg  by mouth every 6 (six) hours as needed.      carbidopa-levodopa (SINEMET IR) 25-100 MG tablet Take 1 tablet by mouth daily in the afternoon. In the morning     cyanocobalamin (,VITAMIN B-12,) 1000 MCG/ML injection INJECT 1ML INTO THE MUSCLE EVERY 30 DAYS 3 mL 3   denosumab (PROLIA) 60 MG/ML SOSY injection Inject 60 mg into the skin every 6 (six) months. takes annually     diclofenac Sodium (VOLTAREN) 1 % GEL Apply 4 g topically 4 (four) times daily as needed. 100 g 0   FLUoxetine HCl 60 MG TABS Take 1 tablet by mouth daily. 90 tablet 1   gabapentin (NEURONTIN) 100 MG capsule Take 3 capsules (300 mg total) by mouth 3 (three) times daily. (Patient taking differently: Take 200 mg by mouth 3 (three) times daily.) 280 capsule 0   letrozole (FEMARA) 2.5 MG tablet TAKE ONE TABLET BY MOUTH DAILY 90 tablet 1   lisinopril-hydrochlorothiazide (ZESTORETIC) 20-12.5 MG tablet TAKE ONE TABLET BY MOUTH DAILY 90 tablet 1   meloxicam (MOBIC) 15 MG tablet Take 1 tablet (15 mg total) by mouth daily. 30 tablet 0   nystatin (MYCOSTATIN/NYSTOP) powder Apply 1 application. topically 3 (three) times daily. 30 g 1   polyethylene glycol (MIRALAX / GLYCOLAX) 17 g packet Take 17 g by mouth daily.     risperiDONE (RISPERDAL) 0.5 MG tablet Take 1 tablet (0.5 mg total) by mouth 2 (two) times daily. 180 tablet 1   traMADol (ULTRAM) 50 MG tablet Take by mouth.     traZODone (DESYREL) 100 MG tablet Take 1 tablet (100 mg total) by mouth at bedtime. 90 tablet 1   No current facility-administered medications for this visit.     Musculoskeletal: Strength & Muscle Tone: within normal limits Gait & Station:  slow Patient leans: N/A  Psychiatric Specialty Exam: Review of Systems  Neurological:  Positive for tremors.  Psychiatric/Behavioral: Negative.    All other systems reviewed and are negative.   Blood pressure (!) 157/73, pulse 94, temperature 98.1 F (36.7 C), temperature source Temporal, weight 225 lb 6.4 oz (102.2  kg).Body mass index is 36.38 kg/m.  General Appearance: Casual  Eye Contact:  Fair  Speech:  Normal Rate  Volume:  Normal  Mood:  Euthymic  Affect:  Congruent  Thought Process:  Goal Directed and Descriptions  of Associations: Intact  Orientation:  Full (Time, Place, and Person)  Thought Content: Logical   Suicidal Thoughts:  No  Homicidal Thoughts:  No  Memory:  Immediate;   Fair Recent;   Poor Remote;   Poor  Judgement:  Fair  Insight:  Shallow  Psychomotor Activity:  Tremor  Concentration:  Concentration: Fair and Attention Span: Fair  Recall:  [employer] of Knowledge: Fair  Language: Fair  Akathisia:  No  Handed:  Right  AIMS (if indicated): done  Assets:  Desire for Improvement Housing Intimacy Social Support Transportation  ADL's:  Intact  Cognition: baseline  Sleep:  Fair   Screenings: [place] Office Visit from 06/04/2022 in [facility] Office Visit from 03/02/2022 in [facility] Visit from 12/01/2021 in [place] Visit from 01/21/2021 in [facility] [place] Total Score 0 0 0 0      GAD-7    Flowsheet Row Counselor from 03/25/2021 in [facility] 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 [facility]  Total GAD-7 Score 4 7      PHQ2-9    [place] Visit from 03/02/2022 in [facility] Visit from 12/23/2021 in [place] Visit from 10/21/2021 in [facility] Visit from 08/12/2021 in [place] from 06/10/2021 in [facility]  PHQ-2 Total Score '1 2 2 1 1  '$ PHQ-9 Total Score '4 6 6 5 '$ --      [place] Office Visit from 03/02/2022 in [facility] from 12/01/2021 in [facility] [place]  Counselor from 06/10/2021 in [place] No Risk No Risk No Risk        Assessment and Plan: Sayda Grable is a 76 year old Caucasian female, married, lives in [place], has a history of bipolar disorder, GAD, insomnia, multiple medical problems including coronary Parkinson's disease, hearing loss was evaluated in office today.  Patient is currently stable on current medication regimen, will benefit from the following plan.  Plan Bipolar disorder in remission Risperidone 0.5 mg p.o. daily in the morning and 0.5 mg at bedtime. Prozac 60 mg p.o. daily  GAD-stable Continue CBT with Ms. Christina Hussami Spouse interested in participating in the next session to discuss treatment goal, to discuss with therapist. Prozac 60 mg p.o. daily  Insomnia-stable Trazodone 100 mg p.o. nightly. Melatonin as needed.  Hyperprolactinemia-improving Prolactin-dated 05/12/2022-down trending-70.5.  Likely due to risperidone.  However will not reduce the dosage of risperidone further today.  Will monitor on this medication dosage.  Unable to complete PHQ 9, GAD 7  Patient may benefit from home health services due to her multiple medical problems including Parkinson's disease as well as psychiatric diagnosis.  Spouse to discuss with primary care provider.  Will coordinate care with primary care provider.  Provided information for pace program.  Collateral information obtained from spouse as noted above.  Follow-up in clinic in 3 months or sooner if needed.  This note was generated in part or whole with voice recognition software. Voice recognition is usually quite accurate but there are transcription errors that can and very often do occur. I apologize for any typographical errors that were not detected and corrected.     Ursula Alert, MD 06/04/2022, 6:35 PM

## 2022-06-05 DIAGNOSIS — G20 Parkinson's disease: Secondary | ICD-10-CM | POA: Diagnosis not present

## 2022-06-05 DIAGNOSIS — I10 Essential (primary) hypertension: Secondary | ICD-10-CM | POA: Diagnosis not present

## 2022-06-05 DIAGNOSIS — G621 Alcoholic polyneuropathy: Secondary | ICD-10-CM | POA: Diagnosis not present

## 2022-06-05 DIAGNOSIS — F319 Bipolar disorder, unspecified: Secondary | ICD-10-CM | POA: Diagnosis not present

## 2022-06-05 DIAGNOSIS — F1021 Alcohol dependence, in remission: Secondary | ICD-10-CM | POA: Diagnosis not present

## 2022-06-05 DIAGNOSIS — M810 Age-related osteoporosis without current pathological fracture: Secondary | ICD-10-CM | POA: Diagnosis not present

## 2022-06-05 DIAGNOSIS — F411 Generalized anxiety disorder: Secondary | ICD-10-CM | POA: Diagnosis not present

## 2022-06-05 DIAGNOSIS — C50919 Malignant neoplasm of unspecified site of unspecified female breast: Secondary | ICD-10-CM | POA: Diagnosis not present

## 2022-06-11 DIAGNOSIS — G20 Parkinson's disease: Secondary | ICD-10-CM | POA: Diagnosis not present

## 2022-06-11 DIAGNOSIS — H903 Sensorineural hearing loss, bilateral: Secondary | ICD-10-CM | POA: Diagnosis not present

## 2022-06-11 DIAGNOSIS — H6123 Impacted cerumen, bilateral: Secondary | ICD-10-CM | POA: Diagnosis not present

## 2022-06-17 ENCOUNTER — Ambulatory Visit
Admission: RE | Admit: 2022-06-17 | Discharge: 2022-06-17 | Disposition: A | Discharge status: Home / Self Care | Payer: Medicare PPO | Source: Ambulatory Visit | Attending: Family Medicine | Admitting: Family Medicine

## 2022-06-17 DIAGNOSIS — Z1231 Encounter for screening mammogram for malignant neoplasm of breast: Secondary | ICD-10-CM | POA: Diagnosis not present

## 2022-06-18 NOTE — Progress Notes (Signed)
[facility]  Normal mammogram; repeat in 1 year.  Please let us know if you have any questions.  Thank you,  Wiliam Cauthorn, FNP 

## 2022-07-24 ENCOUNTER — Ambulatory Visit
Admission: RE | Admit: 2022-07-24 | Discharge: 2022-07-24 | Disposition: A | Discharge status: Home / Self Care | Payer: Medicare PPO | Source: Ambulatory Visit | Attending: Internal Medicine | Admitting: Internal Medicine

## 2022-07-24 DIAGNOSIS — M85852 Other specified disorders of bone density and structure, left thigh: Secondary | ICD-10-CM | POA: Insufficient documentation

## 2022-07-24 DIAGNOSIS — Z780 Asymptomatic menopausal state: Secondary | ICD-10-CM | POA: Diagnosis not present

## 2022-07-24 DIAGNOSIS — C50211 Malignant neoplasm of upper-inner quadrant of right female breast: Secondary | ICD-10-CM | POA: Diagnosis not present

## 2022-07-24 DIAGNOSIS — Z13820 Encounter for screening for osteoporosis: Secondary | ICD-10-CM | POA: Diagnosis not present

## 2022-07-24 DIAGNOSIS — Z170 Estrogen receptor positive status [ER+]: Secondary | ICD-10-CM | POA: Insufficient documentation

## 2022-07-24 DIAGNOSIS — Z923 Personal history of irradiation: Secondary | ICD-10-CM | POA: Diagnosis not present

## 2022-07-26 ENCOUNTER — Other Ambulatory Visit: Payer: Self-pay | Admitting: Psychiatry

## 2022-07-26 DIAGNOSIS — F3176 Bipolar disorder, in full remission, most recent episode depressed: Secondary | ICD-10-CM

## 2022-07-26 DIAGNOSIS — F5105 Insomnia due to other mental disorder: Secondary | ICD-10-CM

## 2022-07-27 ENCOUNTER — Ambulatory Visit (INDEPENDENT_AMBULATORY_CARE_PROVIDER_SITE_OTHER): Payer: Medicare PPO | Admitting: Licensed Clinical Social Worker

## 2022-07-27 DIAGNOSIS — F3176 Bipolar disorder, in full remission, most recent episode depressed: Secondary | ICD-10-CM | POA: Diagnosis not present

## 2022-07-27 NOTE — Progress Notes (Unsigned)
Virtual Visit via Video Note  I connected with Kendra [name] on 07/27/22 at  1:00 PM EDT by a video enabled telemedicine application and verified that I am speaking with the correct person using two identifiers.  Location: Patient: home Provider: remote office [place], [place])   I discussed the limitations of evaluation and management by telemedicine and the availability of in person appointments. The patient expressed understanding and agreed to proceed.  I discussed the assessment and treatment plan with the patient. The patient was provided an opportunity to ask questions and all were answered. The patient agreed with the plan and demonstrated an understanding of the instructions.   The patient was advised to call back or seek an in-person evaluation if the symptoms worsen or if the condition fails to improve as anticipated.  I provided 35 minutes of non-face-to-face time during this encounter.   Kamarii Carton R Abdulaziz Toman, LCSW  THERAPIST PROGRESS NOTE  Session Time: 1-135p  Participation Level: Active  Behavioral Response: NeatAlertAnxious  Type of Therapy: Individual Therapy  Treatment Goals addressed:  Problem: Reduce overall frequency, intensity, and duration of the anxiety so that daily functioning is not impaired Intervention: Perform motivational interviewing regarding use of tools Note: Encouraged pt to use tools Intervention: Discuss self-management skills Note: Discussed self care, stimulating activities, change environment a few times per day (indoors/outdoors or room change), positive social supports, positive activities with spouse  ProgressTowards Goals: Progressing  Interventions: CBT, DBT, and Supportive  Summary: Kendra [name] is a 76 y.o. female who presents with improving symptoms related to depression and anxiety. Pt reports overall mood is stable and that she is managing stress and anxiety well. Pt reports that husband is good support system. Pt reports  appetite WNL and that she is getting fair quality and quantity of sleep. Pt reports no emotional outbursts.   Allowed pt to explore and express thoughts and feelings associated with recent life situations and external stressors.   Continued recommendations are as follows: self care behaviors, positive social engagements, focusing on overall work/home/life balance, and focusing on positive physical and emotional wellness.  .   Suicidal/Homicidal: No  Therapist Response: Pt is continuing to apply interventions learned in session into daily life situations. Pt is currently on track to meet goals utilizing interventions mentioned above. Personal growth and progress noted. Treatment to continue as indicated.   Plan: Return again in 8 weeks.  No diagnosis found.   Collaboration of Care: Other pt to continue care with psychiatrist of record,Dr. Shea Evans  Patient/Guardian was advised Release of Information must be obtained prior to any record release in order to collaborate their care with an outside provider. Patient/Guardian was advised if they have not already done so to contact the registration department to sign all necessary forms in order for [place] to release information regarding their care.   Consent: Patient/Guardian gives verbal consent for treatment and assignment of benefits for services provided during this visit. Patient/Guardian expressed understanding and agreed to proceed.   [place], LCSW 07/27/2022

## 2022-07-27 NOTE — Plan of Care (Signed)
  Problem: Reduce overall frequency, intensity, and duration of the anxiety so that daily functioning is not impaired Intervention: Perform motivational interviewing regarding use of tools Note: Encouraged pt to use tools Intervention: Discuss self-management skills Note: Discussed self care, stimulating activities, change environment a few times per day (indoors/outdoors or room change), positive social supports, positive activities with spouse

## 2022-07-27 NOTE — Patient Instructions (Signed)
    Try to change environmental contexts on a regular basis. Ex.:  inside/outside, go for a drive, change rooms   Engage in positive stimulating activities (board games, cards, sodoku, crossword puzzles, or any game involving thought/strategy (go fish card games, "match" games with cards)  Engage in health-promoting behaviors: medication compliance, making healthy nutritional choices, drinking water  As days get shorter, make sure you are exposed to early morning light: (open blinds to expose to light. Doesn't need to be on skin, can be indirect).   Try to get the best quality and quantity of sleep that is within your control. Limit distractions at night, turn off phones/TV's, make sure temperature in the room is comfortable to you.  Try to "stop thoughts" when you are worrying about the future and try to redirect your focus to the present and focusing on activities that you enjoy and engaging in those activities.

## 2022-08-14 ENCOUNTER — Ambulatory Visit: Payer: Self-pay | Admitting: *Deleted

## 2022-08-14 NOTE — Telephone Encounter (Signed)
Message from Kendra [name] sent at 08/14/2022 12:55 PM EDT  Summary: Urinary discomfort & Redness with drainage L & R eye Advice   Pt is calling for appt as she has urinary discomfort. And redness with drainage in her L & R eye. No available appts. Apt scheduled for Monday with Dr. Ky Barban. Please advise           Call History   Type Contact Phone/Fax User  08/14/2022 12:50 PM EDT Phone (Incoming) B[place], Belcher (Self) [phone] Lemmie Evens) Blase Mess A   Reason for Disposition  Age > 51 years  Answer Assessment - Initial Assessment Questions 1. SYMPTOM: "What's the main symptom you're concerned about?" (e.g., frequency, incontinence)     Burning with urination   2. ONSET: "When did the  burning  start?"     2 days ago    No pain in abd or flank pain.   3. PAIN: "Is there any pain?" If Yes, ask: "How bad is it?" (Scale: 1-10; mild, moderate, severe)     Burning with urination  4. CAUSE: "What do you think is causing the symptoms?"     Urinary trouble 5. OTHER SYMPTOMS: "Do you have any other symptoms?" (e.g., blood in urine, fever, flank pain, pain with urination)     Having redness and drainage from both of her eyes.  Has an appt. On Monday at 10:20 with Dr. Ky Barban. I suggested she get some Azo from the drug store to help with the burning until she can be seen on Monday. 6. PREGNANCY: "Is there any chance you are pregnant?" "When was your last menstrual period?"     N/A due to age  Answer Assessment - Initial Assessment Questions 1. SEVERITY: "How bad is the pain?"  (e.g., Scale 1-10; mild, moderate, or severe)   - MILD (1-3): complains slightly about urination hurting   - MODERATE (4-7): interferes with normal activities     - SEVERE (8-10): excruciating, unwilling or unable to urinate because of the pain      Mild 2. FREQUENCY: "How many times have you had painful urination today?"      Every time 3. PATTERN: "Is pain present every time you urinate or just  sometimes?"      Yes 4. ONSET: "When did the painful urination start?"      2 days ago 5. FEVER: "Do you have a fever?" If Yes, ask: "What is your temperature, how was it measured, and when did it start?"     Not asked 6. PAST UTI: "Have you had a urine infection before?" If Yes, ask: "When was the last time?" and "What happened that time?"      Not asked 7. CAUSE: "What do you think is causing the painful urination?"  (e.g., UTI, scratch, Herpes sore)     UTI 8. OTHER SYMPTOMS: "Do you have any other symptoms?" (e.g., blood in urine, flank pain, genital sores, urgency, vaginal discharge)     Denies flank or abd pain.   9. PREGNANCY: "Is there any chance you are pregnant?" "When was your last menstrual period?"     N/A due to age  Protocols used: Urinary Symptoms-A-AH, Urination Pain - [facility]

## 2022-08-14 NOTE — Telephone Encounter (Signed)
  Chief Complaint: burning with urination and drainage from both eyes with redness Symptoms: burning with urination Frequency: For last 2 days Pertinent Negatives: Patient denies abd or flank pain Disposition: '[]'$ ED /'[]'$ Urgent Care (no appt availability in office) / '[x]'$ Appointment(In office/virtual)/ '[]'$  [facility]/ '[]'$ Home Care/ '[]'$ Refused Recommended Disposition /'[]'$ [facility]/ '[]'$  Follow-up with PCP Additional Notes: An appt. With Dr. Ky Barban was already made for her for 08/17/2022 at 10:20.    I told her about the OTC medication AZO she could use to help with the burning until she could be seen on Monday.   She was agreeable to getting and using this.

## 2022-08-17 ENCOUNTER — Ambulatory Visit: Payer: Medicare PPO | Admitting: Family Medicine

## 2022-08-17 ENCOUNTER — Encounter: Payer: Self-pay | Admitting: Family Medicine

## 2022-08-17 ENCOUNTER — Ambulatory Visit
Admission: RE | Admit: 2022-08-17 | Discharge: 2022-08-17 | Disposition: A | Discharge status: Home / Self Care | Payer: Medicare PPO | Source: Ambulatory Visit | Attending: Family Medicine | Admitting: Family Medicine

## 2022-08-17 ENCOUNTER — Ambulatory Visit
Admission: RE | Admit: 2022-08-17 | Discharge: 2022-08-17 | Disposition: A | Discharge status: Home / Self Care | Payer: Medicare PPO | Attending: Family Medicine | Admitting: Family Medicine

## 2022-08-17 VITALS — BP 117/70 | HR 88 | Temp 98.9°F | Resp 18 | Wt 224.0 lb

## 2022-08-17 DIAGNOSIS — R052 Subacute cough: Secondary | ICD-10-CM | POA: Insufficient documentation

## 2022-08-17 DIAGNOSIS — H0100A Unspecified blepharitis right eye, upper and lower eyelids: Secondary | ICD-10-CM | POA: Diagnosis not present

## 2022-08-17 DIAGNOSIS — H0100B Unspecified blepharitis left eye, upper and lower eyelids: Secondary | ICD-10-CM

## 2022-08-17 DIAGNOSIS — R300 Dysuria: Secondary | ICD-10-CM

## 2022-08-17 DIAGNOSIS — J90 Pleural effusion, not elsewhere classified: Secondary | ICD-10-CM | POA: Diagnosis not present

## 2022-08-17 DIAGNOSIS — R059 Cough, unspecified: Secondary | ICD-10-CM | POA: Diagnosis not present

## 2022-08-17 DIAGNOSIS — R053 Chronic cough: Secondary | ICD-10-CM | POA: Insufficient documentation

## 2022-08-17 DIAGNOSIS — R42 Dizziness and giddiness: Secondary | ICD-10-CM | POA: Diagnosis not present

## 2022-08-17 LAB — POCT URINALYSIS DIPSTICK
Bilirubin, UA: NEGATIVE
Blood, UA: POSITIVE
Glucose, UA: NEGATIVE
Ketones, UA: NEGATIVE
Nitrite, UA: NEGATIVE
Protein, UA: NEGATIVE
Spec Grav, UA: 1.01 (ref 1.010–1.025)
Urobilinogen, UA: 0.2 E.U./dL
pH, UA: 6 (ref 5.0–8.0)

## 2022-08-17 MED ORDER — CEPHALEXIN 500 MG PO CAPS: 500.0000 mg | ORAL_CAPSULE | Freq: Four times a day (QID) | ORAL | 0 refills | Status: AC

## 2022-08-17 NOTE — Assessment & Plan Note (Signed)
Unclear etiology. Lungs clear on exam today. Minimal bilateral dependent LE edema, no change from usual and no crackles on exam today, unlikely PE, new onset CHF. No fever, productive cough, unlikely PNA. No symptoms to concern for malignancy, TB. Never smoker. Will get CXR to evaluate further. No heartburn symptoms illicited, though may trial H2 blocker if CXR negative.

## 2022-08-17 NOTE — Progress Notes (Signed)
   SUBJECTIVE:   CHIEF COMPLAINT / HPI:   URINARY SYMPTOMS - symptom duration 5 days Dysuria: burning Urinary frequency: yes Urgency: yes Hematuria: no Abdominal pain: no Suprapubic pain/pressure: no Fever:  no Vomiting: no Relief with pyridium: yes Previous urinary tract infection: yes Recurrent urinary tract infection: no Vaginal discharge: no Treatments attempted: AZO   EYE PAIN - redness to lids, swelling, slight discharge and matting for weeks Duration:  weeks Involved eye:  R>L Quality: painful, intermittent Foreign body sensation:sometimes Visual impairment: no Eye redness: no Discharge: yes Crusting or matting of eyelids: yes Swelling: yes Itching: no Headache: no Floaters: no URI symptoms: no Contact lens use: no Close contacts with similar problems: no Eye trauma: no Aggravating factors:  Alleviating factors:  Status: stable Treatments attempted: saline eye drops Cataract surgery 1.5 years ago  COUGH Duration: months Circumstances of initial development of cough: unknown Cough severity: mild Cough description: non productive Aggravating factors:  nothing Alleviating factors: nothing Status:  stable Treatments attempted: none Wheezing: no Shortness of breath: no Chest pain: no Chest tightness:no Nasal congestion: no Runny nose: no Frequent throat clearing or swallowing: no Hemoptysis: no Fevers: no Weight loss: no Heartburn: no Recent foreign travel: no Tuberculosis contacts: no   OBJECTIVE:   BP 117/70 (BP Location: Left Arm, Patient Position: Sitting, Cuff Size: Large)   Pulse 88   Temp 98.9 F (37.2 C) (Oral)   Resp 18   Wt 224 lb (101.6 kg)   SpO2 95%   BMI 36.15 kg/m   Gen: well appearing, in NAD HEENT: erythema and matting to bilateral upper and lower lash lines without notable swelling. Sclera white. EOMI.  Card: RRR Lungs: CTAB Ext: WWP, no edema   ASSESSMENT/PLAN:   Subacute cough Unclear etiology. Lungs clear  on exam today. Minimal bilateral dependent LE edema, no change from usual and no crackles on exam today, unlikely PE, new onset CHF. No fever, productive cough, unlikely PNA. No symptoms to concern for malignancy, TB. Never smoker. Will get CXR to evaluate further. No heartburn symptoms illicited, though may trial H2 blocker if CXR negative.   Blepharitis of upper and lower eyelids of both eyes No visual impairment, eye exam otherwise wnl. Conservative care discussed. Warm compresses, baby shampoo for cleansing. F/u if no better.  Dysuria UA with leuks. Will send for culture. Rx keflex x5 days.    Myles Gip, DO

## 2022-08-17 NOTE — Assessment & Plan Note (Signed)
No visual impairment, eye exam otherwise wnl. Conservative care discussed. Warm compresses, baby shampoo for cleansing. F/u if no better.

## 2022-08-19 ENCOUNTER — Telehealth: Payer: Self-pay | Admitting: Family Medicine

## 2022-08-19 DIAGNOSIS — H9190 Unspecified hearing loss, unspecified ear: Secondary | ICD-10-CM

## 2022-08-19 NOTE — Telephone Encounter (Signed)
Referral Request - Has patient seen PCP for this complaint? yes *If NO, is insurance requiring patient see PCP for this issue before PCP can refer them? Referral for which specialty: hearing Preferred provider/office: Dr Pryor Ochoa Reason for referral: hearing aids

## 2022-08-20 NOTE — Telephone Encounter (Signed)
Ok to send referral as requested 

## 2022-08-21 LAB — URINE CULTURE

## 2022-08-21 LAB — SPECIMEN STATUS REPORT

## 2022-08-24 ENCOUNTER — Encounter: Payer: Self-pay | Admitting: *Deleted

## 2022-08-25 ENCOUNTER — Ambulatory Visit: Payer: Medicare PPO | Admitting: Family Medicine

## 2022-08-25 ENCOUNTER — Encounter: Payer: Self-pay | Admitting: Family Medicine

## 2022-08-25 VITALS — BP 115/74 | Resp 16 | Ht 66.0 in | Wt 224.0 lb

## 2022-08-25 DIAGNOSIS — N390 Urinary tract infection, site not specified: Secondary | ICD-10-CM

## 2022-08-25 LAB — POCT URINALYSIS DIPSTICK
Bilirubin, UA: NEGATIVE
Blood, UA: NEGATIVE
Glucose, UA: POSITIVE — AB
Ketones, UA: NEGATIVE
Nitrite, UA: POSITIVE
Protein, UA: POSITIVE — AB
Spec Grav, UA: 1.02 (ref 1.010–1.025)
Urobilinogen, UA: 0.2 E.U./dL
pH, UA: 6 (ref 5.0–8.0)

## 2022-08-25 MED ORDER — NITROFURANTOIN MONOHYD MACRO 100 MG PO CAPS: 100.0000 mg | ORAL_CAPSULE | Freq: Two times a day (BID) | ORAL | 0 refills | Status: AC

## 2022-08-25 NOTE — Progress Notes (Signed)
   SUBJECTIVE:   CHIEF COMPLAINT / HPI:   URINARY SYMPTOMS - seen previously 10/16 for dysuria. Ucx with >100k Ecoli, sensitive to keflex, Rx keflex x5 days. - finished antibiotics 4 days ago. Some improvement but no resolution.   Dysuria: burning Urinary frequency: yes Urgency: no Hematuria: no Abdominal pain: no Back pain: no Suprapubic pain/pressure: no Flank pain: no Fever:  no Vomiting: no Treatments attempted: antibiotics and increasing fluids    OBJECTIVE:   BP 115/74 (BP Location: Left Arm, Patient Position: Sitting, Cuff Size: Normal)   Resp 16   Ht '5\' 6"'$  (1.676 m)   Wt 224 lb (101.6 kg)   SpO2 97%   BMI 36.15 kg/m   Gen: well appearing, in NAD Card: Reg rate Lungs: Comfortable WOB on RA Ext: WWP, no edema   ASSESSMENT/PLAN:   Dysuria Still with UTI symptoms s/p treatment. UA today with large leuks, +nitrites. Will send for culture. Rx macrobid x5 days.   Myles Gip, DO

## 2022-08-27 LAB — CULTURE, URINE COMPREHENSIVE

## 2022-08-28 ENCOUNTER — Inpatient Hospital Stay (HOSPITAL_BASED_OUTPATIENT_CLINIC_OR_DEPARTMENT_OTHER): Payer: Medicare PPO | Admitting: Internal Medicine

## 2022-08-28 ENCOUNTER — Inpatient Hospital Stay: Payer: Medicare PPO | Attending: Internal Medicine

## 2022-08-28 ENCOUNTER — Inpatient Hospital Stay: Payer: Medicare PPO

## 2022-08-28 ENCOUNTER — Encounter: Payer: Self-pay | Admitting: Internal Medicine

## 2022-08-28 DIAGNOSIS — M8580 Other specified disorders of bone density and structure, unspecified site: Secondary | ICD-10-CM | POA: Insufficient documentation

## 2022-08-28 DIAGNOSIS — R053 Chronic cough: Secondary | ICD-10-CM | POA: Diagnosis not present

## 2022-08-28 DIAGNOSIS — Z79811 Long term (current) use of aromatase inhibitors: Secondary | ICD-10-CM | POA: Insufficient documentation

## 2022-08-28 DIAGNOSIS — R739 Hyperglycemia, unspecified: Secondary | ICD-10-CM | POA: Diagnosis not present

## 2022-08-28 DIAGNOSIS — Z79899 Other long term (current) drug therapy: Secondary | ICD-10-CM | POA: Diagnosis not present

## 2022-08-28 DIAGNOSIS — R5383 Other fatigue: Secondary | ICD-10-CM | POA: Diagnosis not present

## 2022-08-28 DIAGNOSIS — C50211 Malignant neoplasm of upper-inner quadrant of right female breast: Secondary | ICD-10-CM | POA: Insufficient documentation

## 2022-08-28 DIAGNOSIS — Z90721 Acquired absence of ovaries, unilateral: Secondary | ICD-10-CM | POA: Diagnosis not present

## 2022-08-28 DIAGNOSIS — Z87440 Personal history of urinary (tract) infections: Secondary | ICD-10-CM | POA: Diagnosis not present

## 2022-08-28 DIAGNOSIS — Z170 Estrogen receptor positive status [ER+]: Secondary | ICD-10-CM | POA: Diagnosis not present

## 2022-08-28 DIAGNOSIS — Z8249 Family history of ischemic heart disease and other diseases of the circulatory system: Secondary | ICD-10-CM | POA: Diagnosis not present

## 2022-08-28 DIAGNOSIS — Z801 Family history of malignant neoplasm of trachea, bronchus and lung: Secondary | ICD-10-CM | POA: Diagnosis not present

## 2022-08-28 DIAGNOSIS — M549 Dorsalgia, unspecified: Secondary | ICD-10-CM | POA: Diagnosis not present

## 2022-08-28 DIAGNOSIS — I10 Essential (primary) hypertension: Secondary | ICD-10-CM | POA: Insufficient documentation

## 2022-08-28 DIAGNOSIS — Z923 Personal history of irradiation: Secondary | ICD-10-CM | POA: Diagnosis not present

## 2022-08-28 DIAGNOSIS — G8929 Other chronic pain: Secondary | ICD-10-CM | POA: Diagnosis not present

## 2022-08-28 DIAGNOSIS — F419 Anxiety disorder, unspecified: Secondary | ICD-10-CM | POA: Diagnosis not present

## 2022-08-28 DIAGNOSIS — E538 Deficiency of other specified B group vitamins: Secondary | ICD-10-CM | POA: Diagnosis not present

## 2022-08-28 DIAGNOSIS — M2550 Pain in unspecified joint: Secondary | ICD-10-CM | POA: Diagnosis not present

## 2022-08-28 DIAGNOSIS — Z833 Family history of diabetes mellitus: Secondary | ICD-10-CM | POA: Diagnosis not present

## 2022-08-28 DIAGNOSIS — G20A1 Parkinson's disease without dyskinesia, without mention of fluctuations: Secondary | ICD-10-CM | POA: Diagnosis not present

## 2022-08-28 DIAGNOSIS — Z803 Family history of malignant neoplasm of breast: Secondary | ICD-10-CM | POA: Insufficient documentation

## 2022-08-28 DIAGNOSIS — M85851 Other specified disorders of bone density and structure, right thigh: Secondary | ICD-10-CM

## 2022-08-28 DIAGNOSIS — D271 Benign neoplasm of left ovary: Secondary | ICD-10-CM | POA: Insufficient documentation

## 2022-08-28 DIAGNOSIS — M25559 Pain in unspecified hip: Secondary | ICD-10-CM | POA: Insufficient documentation

## 2022-08-28 LAB — COMPREHENSIVE METABOLIC PANEL
ALT: 10 U/L (ref 0–44)
AST: 24 U/L (ref 15–41)
Albumin: 3.7 g/dL (ref 3.5–5.0)
Alkaline Phosphatase: 32 U/L — ABNORMAL LOW (ref 38–126)
Anion gap: 10 (ref 5–15)
BUN: 8 mg/dL (ref 8–23)
CO2: 26 mmol/L (ref 22–32)
Calcium: 9 mg/dL (ref 8.9–10.3)
Chloride: 101 mmol/L (ref 98–111)
Creatinine, Ser: 0.83 mg/dL (ref 0.44–1.00)
GFR, Estimated: >60 mL/min (ref >60)
Glucose, Bld: 162 mg/dL — ABNORMAL HIGH (ref 70–99)
Potassium: 3.5 mmol/L (ref 3.5–5.1)
Sodium: 137 mmol/L (ref 135–145)
Total Bilirubin: 0.5 mg/dL (ref 0.3–1.2)
Total Protein: 6.6 g/dL (ref 6.5–8.1)

## 2022-08-28 LAB — CBC WITH DIFFERENTIAL/PLATELET
Abs Immature Granulocytes: 0.03 10*3/uL (ref 0.00–0.07)
Basophils Absolute: 0.1 10*3/uL (ref 0.0–0.1)
Basophils Relative: 1 %
Eosinophils Absolute: 0.2 10*3/uL (ref 0.0–0.5)
Eosinophils Relative: 3 %
HCT: 41 % (ref 36.0–46.0)
Hemoglobin: 13.2 g/dL (ref 12.0–15.0)
Immature Granulocytes: 1 %
Lymphocytes Relative: 12 %
Lymphs Abs: 0.8 10*3/uL (ref 0.7–4.0)
MCH: 27.9 pg (ref 26.0–34.0)
MCHC: 32.2 g/dL (ref 30.0–36.0)
MCV: 86.7 fL (ref 80.0–100.0)
Monocytes Absolute: 0.6 10*3/uL (ref 0.1–1.0)
Monocytes Relative: 9 %
Neutro Abs: 4.9 10*3/uL (ref 1.7–7.7)
Neutrophils Relative %: 74 %
Platelets: 240 10*3/uL (ref 150–400)
RBC: 4.73 MIL/uL (ref 3.87–5.11)
RDW: 13.8 % (ref 11.5–15.5)
WBC: 6.5 10*3/uL (ref 4.0–10.5)
nRBC: 0 % (ref 0.0–0.2)

## 2022-08-28 MED ORDER — DENOSUMAB 60 MG/ML ~~LOC~~ SOSY
60.0000 mg | PREFILLED_SYRINGE | Freq: Once | SUBCUTANEOUS | Status: AC
  Administered 2022-08-28: 60 mg via SUBCUTANEOUS
  Filled 2022-08-28: qty 1

## 2022-08-28 NOTE — Progress Notes (Signed)
Patient here for oncology follow-up appointment, expresses concerns of urinary frequency, reports recent UTI

## 2022-08-28 NOTE — Assessment & Plan Note (Addendum)
#  multi-focal stage IA right breast cancers/p right lumpectomy on 04/27/2018.  ER + (100%), PR + (70%), and HER-2 negative. Ki-67 was 10%. Pathologic stage was mT2N0. Oncotype DX revealed a recurrence score of 12 which translated to a distant recurrence of 3% at 9 years with an AI or tamxifen alone. No chemotherapy given.  S/p breast radiationfrom 06/30/2018 - 07/20/2018. She began [facility] 09/06/2018. She is tolerating it well. AUG 2023- Mammo BIL- WNL. STABLE.  Consider discontinuation of Femara in December 2024.  # continue Femara [Nov 2024; 5 years]; tolerating well with no bleeding side effects noted.   #B12 deficiency [hx of perncious abemia]- Patient's cousin gives her B12 injections monthly.   # Osteopenia-  BMD- 06/12/2020; on Prolia- Labs today reviewed;  acceptable for treatment today. The BMD measured at Femur Neck Left is 0.818 g/cm2 with a T-score of -1.6- Overall STABLE. Continue Prolia SQ.   # PArkinson- /trermors/ gait sintabiliy- sinemet- STABLE.   # Hip pain/arthritis-defer to PCP for further evaluation.  # Elevated BG- PBF- 166-discussed regarding dietary discretion; also checking blood sugars fasting labs.  # DISPOSITION: # prolia today #  RTC in 6 months for MD assessment, labs (CBC with diff, CMP, CA 27-29, CA-125) and +/- Prolia.-Dr.B

## 2022-08-28 NOTE — Progress Notes (Signed)
[facility] NOTE  Patient Care Team: Brita Romp Dionne Bucy, MD as PCP - General (Family Medicine) Noreene Filbert, MD as Referring Physician (Radiation Oncology) Leandrew Koyanagi, MD as Referring Physician (Ophthalmology) Blythe Stanford, PT (Inactive) as Physical Therapist (Physical Therapy) Cammie Sickle, MD as Consulting Physician (Hematology and Oncology)  CHIEF COMPLAINTS/PURPOSE OF CONSULTATION: Breast cancer.  Oncology History Overview Note  # multi-focal stage IA right breast cancer s/p right lumpectomy on 04/27/2018.  Pathology of the right lateral lesion revealed a 2.5 cm grade II invasive ductal carcinoma with intermediate grade DCIS.  The medial lesion revealed a 1.1 cm grade II invasive ductal carcinoma with intermediate grade DCIS. LVIDS was present.  Margins were negative.  Zero of 2 lymph nodes were positive. Both tumors were ER + (100%), PR + (70%), and HER-2 negative.  Ki-67 was 10%.  Pathologic stage was mT2N0. Oncotype DX revealed a recurrence score of 12 which translated to a distant recurrence of 3% at 9 years with an AI or tamxifen alone.  The absolute benefit of chemotherpay was < 1%. S/p breast radiation from 06/30/2018 - 07/20/2018.    She began Femara on 09/06/2018.     # multilocular mucinous cystadenoma: s/p exploratory laparotomy, bilateral salpingo-oophrectomy, and pelvic washings on 02/10/2018. 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  Peritoneal washings revealed atypical cells.     Malignant neoplasm of upper-inner quadrant of right breast in female, estrogen receptor positive ([place])  03/14/2018 Initial Diagnosis   Right breast mass noted on a CT scan, on mammogram 2 separate masses were noted.  2.2 cm mass at 11 o'clock position: IDC grade 2 with DCIS, ER 95%, PR 70%, Ki-67 10%,HER-2 negative ratio 1.3; second mass 1.3 cm at 1 o'clock position: IDC grade 2, ER 100%, PR 70%, Ki-67 10%, HER-2 negative  ratio 1.6, T2N0 stage Ib clinical stage AJCC 8   04/27/2018 Surgery   Right lumpectomy lateral: IDC grade 2, 2.5 cm, intermediate grade DCIS; right lumpectomy medial: IDC grade 2 1.1 cm, LVIDS present, intermediate grade DCIS, margins negative, 0/2 lymph nodes negative both tumors are ER PR positive HER-2 negative with a Ki-67 of 10%, T2N0 stage   05/06/2018 Cancer Staging   Staging form: Breast, AJCC 8th Edition - Pathologic: Stage IA (pT2(2), pN0, cM0, G2, ER+, PR+, HER2-) - Signed by Nicholas Lose, MD on 05/06/2018     HISTORY OF PRESENTING ILLNESS: Patient ambulating independently.   Accompanied by family.   Kendra [name] 76 y.o.  female patient with stage I breast cancer ER/PR positive HER2 negative, Parkinson's disease chronic back pain-currently on Femara is here for follow-up.  Patient here for oncology follow-up appointment, expresses concerns of urinary frequency, reports recent UTI. S/p Antibiotics x2- improving.  Denies any hot flashes.  Denies any falls.  Appetite is good.  No weight loss no nausea vomiting.  She is compliant with her Femara.  Review of Systems  Constitutional:  Positive for malaise/fatigue. Negative for chills, diaphoresis, fever and weight loss.  HENT:  Negative for nosebleeds and sore throat.   Eyes:  Negative for double vision.  Respiratory:  Negative for cough, hemoptysis, sputum production, shortness of breath and wheezing.   Cardiovascular:  Negative for chest pain, palpitations, orthopnea and leg swelling.  Gastrointestinal:  Negative for abdominal pain, blood in stool, constipation, diarrhea, heartburn, melena, nausea and vomiting.  Genitourinary:  Negative for dysuria, frequency and urgency.  Musculoskeletal:  Positive for back pain and joint pain.  Skin: Negative.  Negative  for itching and rash.  Neurological:  Negative for dizziness, tingling, focal weakness, weakness and headaches.  Endo/Heme/Allergies:  Does not bruise/bleed easily.   Psychiatric/Behavioral:  Negative for depression. The patient is not nervous/anxious and does not have insomnia.      MEDICAL HISTORY:  Past Medical History:  Diagnosis Date  . Anxiety   . Breast cancer ([place]) 03/2018   right breast cancer at 11:00 and 1:00  . Bursitis    bilateral hips and knees  . Complication of anesthesia   . Depression   . Family history of breast cancer 03/24/2018  . Family history of lung cancer 03/24/2018  . History of alcohol dependence ([place]) 2007   no ETOH; resolved since 2007  . History of hiatal hernia   . History of psychosis 2014   due to sleep disturbance  . Hypertension   . Parkinson disease   . Personal history of radiation therapy 06/2018-07/2018   right breast ca  . PONV (postoperative nausea and vomiting)     SURGICAL HISTORY: Past Surgical History:  Procedure Laterality Date  . BREAST BIOPSY Right 03/14/2018   11:00 DCIS and invasive ductal carcinoma  . BREAST BIOPSY Right 03/14/2018   1:00 Invasive ductal carcinoma  . BREAST LUMPECTOMY Right 04/27/2018   lumpectomy of 11 and 1:00 cancers, clear margins, negative LN  . BREAST LUMPECTOMY WITH RADIOACTIVE SEED AND SENTINEL LYMPH NODE BIOPSY Right 04/27/2018   Procedure: RIGHT BREAST LUMPECTOMY WITH RADIOACTIVE SEED X 2 AND RIGHT SENTINEL LYMPH NODE BIOPSY;  Surgeon: [name], MD;  Location: [facility];  Service: General;  Laterality: Right;  . CATARACT EXTRACTION W/PHACO Left 08/30/2019   Procedure: CATARACT EXTRACTION PHACO AND INTRAOCULAR LENS PLACEMENT ([place]) LEFT panoptix toric  01:20.5  12.7%  10.26;  Surgeon: Leandrew Koyanagi, MD;  Location: [facility];  Service: Ophthalmology;  Laterality: Left;  . CATARACT EXTRACTION W/PHACO Right 09/20/2019   Procedure: CATARACT EXTRACTION PHACO AND INTRAOCULAR LENS PLACEMENT ([facility]) RIGHT 5.71  00:36.4  15.7%;  Surgeon: Leandrew Koyanagi, MD;  Location: [place];  Service: Ophthalmology;  Laterality:  Right;  . LAPAROTOMY N/A 02/10/2018   Procedure: EXPLORATORY LAPAROTOMY;  Surgeon: Isabel Caprice, MD;  Location: [facility];  Service: Gynecology;  Laterality: N/A;  . MASS EXCISION  01/2018   abdominal  . OVARIAN CYST REMOVAL    . SALPINGOOPHORECTOMY Bilateral 02/10/2018   Procedure: BILATERAL SALPINGO OOPHORECTOMY; PERITONEAL WASHINGS;  Surgeon: Isabel Caprice, MD;  Location: [facility];  Service: Gynecology;  Laterality: Bilateral;  . TONSILLECTOMY    . TONSILLECTOMY AND ADENOIDECTOMY  1953    SOCIAL HISTORY: Social History   Socioeconomic History  . Marital status: Married    Spouse name: robert  . Number of children: 0  . Years of education: Not on file  . Highest education level: Some college, no degree  Occupational History  . Occupation: retired Furniture conservator/restorer of education  Tobacco Use  . Smoking status: Never  . Smokeless tobacco: Never  Vaping Use  . Vaping Use: Never used  Substance and Sexual Activity  . Alcohol use: Not Currently    Comment: alcohol dependence prior to 2007  . Drug use: Never  . Sexual activity: Yes    Partners: Male    Birth control/protection: Surgical  Other Topics Concern  . Not on file  Social History Narrative  . Not on file   Social Determinants of Health   Financial Resource Strain: Low Risk  (08/29/2020)  Overall Emergency planning/management officer Strain (CARDIA)   . Difficulty of Paying Living Expenses: Not hard at all  Food Insecurity: No Food Insecurity (08/29/2020)   Hunger Vital Sign   . Worried About Charity fundraiser in the Last Year: Never true   . Ran Out of Food in the Last Year: Never true  Transportation Needs: No Transportation Needs (08/29/2020)   PRAPARE - Transportation   . Lack of Transportation (Medical): No   . Lack of Transportation (Non-Medical): No  Physical Activity: Inactive (08/29/2020)   Exercise Vital Sign   . Days of Exercise per Week: 0 days   . Minutes of Exercise per Session: 0 min  Stress:  Stress Concern Present (08/29/2020)   [place]   . Feeling of Stress : To some extent  Social Connections: Moderately Integrated (08/29/2020)   Social Connection and Isolation Panel [NHANES]   . Frequency of Communication with Friends and Family: More than three times a week   . Frequency of Social Gatherings with Friends and Family: More than three times a week   . Attends Religious Services: Never   . Active Member of Clubs or Organizations: Yes   . Attends Archivist Meetings: 1 to 4 times per year   . Marital Status: Married  Human resources officer Violence: Not At Risk (08/29/2020)   Humiliation, Afraid, Rape, and Kick questionnaire   . Fear of Current or Ex-Partner: No   . Emotionally Abused: No   . Physically Abused: No   . Sexually Abused: No    FAMILY HISTORY: Family History  Problem Relation Age of Onset  . Diabetes Mother   . Breast cancer Mother 59  . Hypertension Mother   . Lung cancer Father        asbestos exposure  . Heart disease Brother 103  . Breast cancer Cousin 42       paternal cousin  . Heart disease Paternal Grandmother 63  . Heart disease Paternal Grandfather     ALLERGIES:  is allergic to hydroxyzine, other, and shrimp [shellfish allergy].  MEDICATIONS:  Current Outpatient Medications  Medication Sig Dispense Refill  . Acetaminophen 500 MG capsule Take 1,000 mg by mouth every 6 (six) hours as needed.     . carbidopa-levodopa (SINEMET IR) 25-100 MG tablet Take 1 tablet by mouth daily in the afternoon. In the morning    . cyanocobalamin (,VITAMIN B-12,) 1000 MCG/ML injection INJECT 1ML INTO THE MUSCLE EVERY 30 DAYS 3 mL 3  . denosumab (PROLIA) 60 MG/ML SOSY injection Inject 60 mg into the skin every 6 (six) months. takes annually    . diclofenac Sodium (VOLTAREN) 1 % GEL Apply 4 g topically 4 (four) times daily as needed. 100 g 0  . FLUoxetine HCl 60 MG TABS Take 1 tablet by  mouth daily. 90 tablet 1  . gabapentin (NEURONTIN) 100 MG capsule Take 3 capsules (300 mg total) by mouth 3 (three) times daily. (Patient taking differently: Take 200 mg by mouth 3 (three) times daily.) 280 capsule 0  . letrozole (FEMARA) 2.5 MG tablet TAKE ONE TABLET BY MOUTH DAILY 90 tablet 1  . lisinopril-hydrochlorothiazide (ZESTORETIC) 20-12.5 MG tablet TAKE ONE TABLET BY MOUTH DAILY 90 tablet 1  . meloxicam (MOBIC) 15 MG tablet Take 1 tablet (15 mg total) by mouth daily. 30 tablet 0  . nitrofurantoin, macrocrystal-monohydrate, (MACROBID) 100 MG capsule Take 1 capsule (100 mg total) by mouth 2 (two) times daily for 5  days. 10 capsule 0  . nystatin (MYCOSTATIN/NYSTOP) powder Apply 1 application. topically 3 (three) times daily. 30 g 1  . polyethylene glycol (MIRALAX / GLYCOLAX) 17 g packet Take 17 g by mouth daily.    . risperiDONE (RISPERDAL) 0.5 MG tablet Take 1 tablet (0.5 mg total) by mouth 2 (two) times daily. 180 tablet 1  . traMADol (ULTRAM) 50 MG tablet Take by mouth.    . traZODone (DESYREL) 100 MG tablet TAKE ONE TABLET BY MOUTH AT BEDTIME 90 tablet 1   No current facility-administered medications for this visit.      Marland Kitchen  PHYSICAL EXAMINATION:  Vitals:   08/28/22 1049  BP: (!) 144/84  Pulse: 82  Resp: 17  Temp: (!) 97 F (36.1 C)  SpO2: 97%   Filed Weights   08/28/22 1049  Weight: 223 lb (101.2 kg)    Physical Exam Vitals and nursing note reviewed.  HENT:     Head: Normocephalic and atraumatic.     Mouth/Throat:     Pharynx: Oropharynx is clear.  Eyes:     Extraocular Movements: Extraocular movements intact.     Pupils: Pupils are equal, round, and reactive to light.  Cardiovascular:     Rate and Rhythm: Normal rate and regular rhythm.  Pulmonary:     Comments: Decreased breath sounds bilaterally.  Abdominal:     Palpations: Abdomen is soft.  Musculoskeletal:        General: Normal range of motion.     Cervical back: Normal range of motion.  Skin:     General: Skin is warm.  Neurological:     General: No focal deficit present.     Mental Status: She is alert and oriented to person, place, and time.  Psychiatric:        Behavior: Behavior normal.        Judgment: Judgment normal.     LABORATORY DATA:  I have reviewed the data as listed Lab Results  Component Value Date   WBC 6.5 08/28/2022   HGB 13.2 08/28/2022   HCT 41.0 08/28/2022   MCV 86.7 08/28/2022   PLT 240 08/28/2022   Recent Labs    09/01/21 1002 02/27/22 1007 08/28/22 1018  NA 136 135 137  K 3.6 3.5 3.5  CL 99 103 101  CO2 _0 GLUCOSE 124* 127* 162*  BUN 5* 10 8  CREATININE 0.69 0.71 0.83  CALCIUM 8.9 9.1 9.0  GFRNONAA >60 >60 >60  PROT 6.7 6.9 6.6  ALBUMIN 4.0 4.1 3.7  AST _1 ALT _2 ALKPHOS 29* 28* 32*  BILITOT 0.7 0.5 0.5    RADIOGRAPHIC STUDIES: I have personally reviewed the radiological images as listed and agreed with the findings in the report. DG Chest 2 View  Result Date: 08/18/2022 CLINICAL DATA:  Persistent cough. Productive cough for 10 days. Dizziness for 3 months. EXAM: CHEST - 2 VIEW COMPARISON:  04/11/2019 FINDINGS: Similar elevation of the RIGHT hemidiaphragm. Heart size is normal. Lungs are free of focal consolidations and pleural effusions. IMPRESSION: No active cardiopulmonary disease. Electronically Signed   By: Nolon Nations M.D.   On: 08/18/2022 08:32    Malignant neoplasm of upper-inner quadrant of right breast in female, estrogen receptor positive ([place]) # multi-focal stage IA right breast cancer s/p right lumpectomy on 04/27/2018.  ER + (100%), PR + (70%), and HER-2 negative.  Ki-67 was 10%.  Pathologic stage was mT2N0. Oncotype DX revealed a recurrence score  of 12 which translated to a distant recurrence of 3% at 9 years with an AI or tamxifen alone.  No chemotherapy given.  S/p breast radiation from 06/30/2018 - 07/20/2018.  She began Femara on 09/06/2018.  She is tolerating it well. AUG 2023- Mammo BIL-  WNL. STABLE.  Consider discontinuation of Femara in December 2024.   # continue Femara [Nov 2024; 5 years]; tolerating well with no bleeding side effects noted.   #B12 deficiency [hx of perncious abemia]- Patient's cousin gives her B12 injections monthly.              # Osteopenia-  BMD- 06/12/2020; on Prolia- Labs today reviewed;  acceptable for treatment today. The BMD measured at Femur Neck Left is 0.818 g/cm2 with a T-score of -1.6- Overall STABLE. Continue Prolia SQ.   #  PArkinson- /trermors/ gait sintabiliy- sinemet - STABLE.   # Hip pain/arthritis-defer to PCP for further evaluation.  # Elevated BG- PBF- 166-discussed regarding dietary discretion; also checking blood sugars fasting labs.  # DISPOSITION: # prolia today #  RTC in 6 months for MD assessment, labs (CBC with diff, CMP, CA 27-29, CA-125) and +/- Prolia.-Dr.B      All questions were answered. The patient knows to call the clinic with any problems, questions or concerns.       Cammie Sickle, MD 08/28/2022 7:55 PM

## 2022-08-29 LAB — CANCER ANTIGEN 27.29: CA 27.29: 23.9 U/mL (ref 0.0–38.6)

## 2022-08-30 LAB — CA 125: Cancer Antigen (CA) 125: 8.9 U/mL (ref 0.0–38.1)

## 2022-09-04 ENCOUNTER — Encounter: Payer: Self-pay | Admitting: Psychiatry

## 2022-09-04 ENCOUNTER — Ambulatory Visit (INDEPENDENT_AMBULATORY_CARE_PROVIDER_SITE_OTHER): Payer: Medicare PPO | Admitting: Psychiatry

## 2022-09-04 VITALS — BP 136/66 | HR 102 | Temp 98.9°F | Ht 66.0 in | Wt 226.4 lb

## 2022-09-04 DIAGNOSIS — E221 Hyperprolactinemia: Secondary | ICD-10-CM | POA: Diagnosis not present

## 2022-09-04 DIAGNOSIS — F3176 Bipolar disorder, in full remission, most recent episode depressed: Secondary | ICD-10-CM

## 2022-09-04 DIAGNOSIS — F411 Generalized anxiety disorder: Secondary | ICD-10-CM

## 2022-09-04 DIAGNOSIS — F5105 Insomnia due to other mental disorder: Secondary | ICD-10-CM

## 2022-09-04 MED ORDER — FLUOXETINE HCL 60 MG PO TABS: 1.0000 | ORAL_TABLET | Freq: Every day | ORAL | 1 refills | Status: DC

## 2022-09-04 NOTE — Progress Notes (Signed)
[place] [facility] MD OP Progress Note  09/04/2022 12:27 PM Kendra [name]  MRN:  [id number]  Chief Complaint:  Chief Complaint  Patient presents with   Follow-up   Anxiety   Depression   Medication Refill   HPI: Kendra [name] is a 76 year old Caucasian female married, retired, lives in [place] ,with history of bipolar disorder, GAD, insomnia, hyperprolactinemia, multifocal stage I right breast cancer status post lumpectomy, ovarian cyst removal, Parkinson's disease, bilateral hearing loss was evaluated in office today.  Patient spouse provided collateral information-Kendra [name],  Patient today appeared to be alert, oriented to person place situation.  Patient was able to answer questions appropriately with some support from spouse.  Reports overall she is doing well on the current medication regimen although she does need support from her husband.  Denies any suicidality, homicidality or perceptual disturbances.  Denies any side effects to medications and reports she is compliant.  Continues to follow-up with her therapist and reports therapy sessions are beneficial.  Spouse reports patient has been needing more and more support at home mostly due to her progressing Parkinson's disease as well as short-term memory problems.  He believes she may need home health or some support at home since he is unable to provide everything.  Agrees to talk to primary care provider as well as call PACE program-information provided last visit.  Denies any other concerns today.  Visit Diagnosis:    ICD-10-CM   1. Bipolar disorder, in full remission, most recent episode depressed ([place])  F31.76     2. GAD (generalized anxiety disorder)  F41.1 FLUoxetine HCl 60 MG TABS    3. Insomnia due to mental condition  F51.05    mood    4. Hyperprolactinemia ([place])  E22.1       Past Psychiatric History: Reviewed past psychiatric history from progress note on 03/01/2019.  Past trials of risperidone, Prozac, Seroquel,  Ativan.  Past Medical History:  Past Medical History:  Diagnosis Date   Anxiety    Breast cancer ([employer]) 03/2018   right breast cancer at 11:00 and 1:00   Bursitis    bilateral hips and knees   Complication of anesthesia    Depression    Family history of breast cancer 03/24/2018   Family history of lung cancer 03/24/2018   History of alcohol dependence ([place]) 2007   no ETOH; resolved since 2007   History of hiatal hernia    History of psychosis 2014   due to sleep disturbance   Hypertension    Parkinson disease    Personal history of radiation therapy 06/2018-07/2018   right breast ca   PONV (postoperative nausea and vomiting)     Past Surgical History:  Procedure Laterality Date   BREAST BIOPSY Right 03/14/2018   11:00 DCIS and invasive ductal carcinoma   BREAST BIOPSY Right 03/14/2018   1:00 Invasive ductal carcinoma   BREAST LUMPECTOMY Right 04/27/2018   lumpectomy of 11 and 1:00 cancers, clear margins, negative LN   BREAST LUMPECTOMY WITH RADIOACTIVE SEED AND SENTINEL LYMPH NODE BIOPSY Right 04/27/2018   Procedure: RIGHT BREAST LUMPECTOMY WITH RADIOACTIVE SEED X 2 AND RIGHT SENTINEL LYMPH NODE BIOPSY;  Surgeon: [name], MD;  Location: [facility];  Service: General;  Laterality: Right;   CATARACT EXTRACTION W/PHACO Left 08/30/2019   Procedure: CATARACT EXTRACTION PHACO AND INTRAOCULAR LENS PLACEMENT ([place]) LEFT panoptix toric  01:20.5  12.7%  10.26;  Surgeon: Leandrew Koyanagi, MD;  Location: [facility];  Service: Ophthalmology;  Laterality: Left;  CATARACT EXTRACTION W/PHACO Right 09/20/2019   Procedure: CATARACT EXTRACTION PHACO AND INTRAOCULAR LENS PLACEMENT ([facility]) RIGHT 5.71  00:36.4  15.7%;  Surgeon: Leandrew Koyanagi, MD;  Location: [facility];  Service: Ophthalmology;  Laterality: Right;   LAPAROTOMY N/A 02/10/2018   Procedure: EXPLORATORY LAPAROTOMY;  Surgeon: Isabel Caprice, MD;  Location: [facility];  Service: Gynecology;   Laterality: N/A;   MASS EXCISION  01/2018   abdominal   OVARIAN CYST REMOVAL     SALPINGOOPHORECTOMY Bilateral 02/10/2018   Procedure: BILATERAL SALPINGO OOPHORECTOMY; PERITONEAL WASHINGS;  Surgeon: Isabel Caprice, MD;  Location: [facility];  Service: Gynecology;  Laterality: Bilateral;   TONSILLECTOMY     TONSILLECTOMY AND ADENOIDECTOMY  1953    Family Psychiatric History: Reviewed family psychiatric history from progress note on 03/01/2019.  Family History:  Family History  Problem Relation Age of Onset   Diabetes Mother    Breast cancer Mother 59   Hypertension Mother    Lung cancer Father        asbestos exposure   Heart disease Brother 68   Breast cancer Cousin 62       paternal cousin   Heart disease Paternal Grandmother 65   Heart disease Paternal Grandfather     Social History: Reviewed social history from progress note on 03/01/2019. Social History   Socioeconomic History   Marital status: Married    Spouse name: Kendra [name]   Number of children: 0   Years of education: Not on file   Highest education level: Some college, no degree  Occupational History   Occupation: retired Furniture conservator/restorer of education  Tobacco Use   Smoking status: Never   Smokeless tobacco: Never  Vaping Use   Vaping Use: Never used  Substance and Sexual Activity   Alcohol use: Not Currently    Comment: alcohol dependence prior to 2007   Drug use: Never   Sexual activity: Yes    Partners: Male    Birth control/protection: Surgical  Other Topics Concern   Not on file  Social History Narrative   Not on file   Social Determinants of Health   Financial Resource Strain: Low Risk  (08/29/2020)   Overall Financial Resource Strain (CARDIA)    Difficulty of Paying Living Expenses: Not hard at all  Food Insecurity: No Food Insecurity (08/29/2020)   Hunger Vital Sign    Worried About Running Out of Food in the Last Year: Never true    [place] in the Last Year: Never true   Transportation Needs: No Transportation Needs (08/29/2020)   PRAPARE - Hydrologist (Medical): No    Lack of Transportation (Non-Medical): No  Physical Activity: Inactive (08/29/2020)   Exercise Vital Sign    Days of Exercise per Week: 0 days    Minutes of Exercise per Session: 0 min  Stress: Stress Concern Present (08/29/2020)   [place]    Feeling of Stress : To some extent  Social Connections: Moderately Integrated (08/29/2020)   Social Connection and Isolation Panel [NHANES]    Frequency of Communication with Friends and Family: More than three times a week    Frequency of Social Gatherings with Friends and Family: More than three times a week    Attends Religious Services: Never    Marine scientist or Organizations: Yes    Attends Archivist Meetings: 1 to 4 times per year  Marital Status: Married    Allergies:  Allergies  Allergen Reactions   Hydroxyzine Anaphylaxis    Tongue swollen   Other Other (See Comments)    Food poisoning    Shrimp [Shellfish Allergy] Other (See Comments)    Food poisoning     Metabolic Disorder Labs: Lab Results  Component Value Date   HGBA1C 5.9 (H) 12/17/2021   Lab Results  Component Value Date   PROLACTIN 70.5 (H) 05/12/2022   PROLACTIN 96.8 (H) 12/17/2021   Lab Results  Component Value Date   CHOL 190 12/17/2021   TRIG 105 12/17/2021   HDL 58 12/17/2021   CHOLHDL 3.3 12/17/2021   LDLCALC 113 (H) 12/17/2021   LDLCALC 101 (H) 02/27/2021   Lab Results  Component Value Date   TSH 2.31 04/13/2019   TSH 2.543 07/25/2018    Therapeutic Level Labs: No results found for: "LITHIUM" No results found for: "VALPROATE" No results found for: "CBMZ"  Current Medications: Current Outpatient Medications  Medication Sig Dispense Refill   Acetaminophen 500 MG capsule Take 1,000 mg by mouth every 6 (six) hours as needed.       carbidopa-levodopa (SINEMET IR) 25-100 MG tablet Take 1 tablet by mouth daily in the afternoon. In the morning     cyanocobalamin (,VITAMIN B-12,) 1000 MCG/ML injection INJECT 1ML INTO THE MUSCLE EVERY 30 DAYS 3 mL 3   denosumab (PROLIA) 60 MG/ML SOSY injection Inject 60 mg into the skin every 6 (six) months. takes annually     diclofenac Sodium (VOLTAREN) 1 % GEL Apply 4 g topically 4 (four) times daily as needed. 100 g 0   gabapentin (NEURONTIN) 100 MG capsule Take 3 capsules (300 mg total) by mouth 3 (three) times daily. (Patient taking differently: Take 200 mg by mouth 3 (three) times daily.) 280 capsule 0   letrozole (FEMARA) 2.5 MG tablet TAKE ONE TABLET BY MOUTH DAILY 90 tablet 1   lisinopril-hydrochlorothiazide (ZESTORETIC) 20-12.5 MG tablet TAKE ONE TABLET BY MOUTH DAILY 90 tablet 1   meloxicam (MOBIC) 15 MG tablet Take 1 tablet (15 mg total) by mouth daily. 30 tablet 0   nystatin (MYCOSTATIN/NYSTOP) powder Apply 1 application. topically 3 (three) times daily. 30 g 1   polyethylene glycol (MIRALAX / GLYCOLAX) 17 g packet Take 17 g by mouth daily.     risperiDONE (RISPERDAL) 0.5 MG tablet Take 1 tablet (0.5 mg total) by mouth 2 (two) times daily. 180 tablet 1   traMADol (ULTRAM) 50 MG tablet Take by mouth.     traZODone (DESYREL) 100 MG tablet TAKE ONE TABLET BY MOUTH AT BEDTIME 90 tablet 1   FLUoxetine HCl 60 MG TABS Take 1 tablet by mouth daily. 90 tablet 1   No current facility-administered medications for this visit.     Musculoskeletal: Strength & Muscle Tone: within normal limits Gait & Station:  slow Patient leans: N/A  Psychiatric Specialty Exam: Review of Systems  Neurological:  Positive for tremors (chronic).  All other systems reviewed and are negative.   Blood pressure 136/66, pulse (!) 102, temperature 98.9 F (37.2 C), temperature source Oral, height '5\' 6"'$  (1.676 m), weight 226 lb 6.4 oz (102.7 kg).Body mass index is 36.54 kg/m.  General Appearance: Casual   Eye Contact:  Fair  Speech:  Clear and Coherent  Volume:  Normal  Mood:  Euthymic  Affect:  Appropriate  Thought Process:  Goal Directed and Descriptions of Associations: Intact  Orientation:  Other:  person, place, situation  Thought Content:  Logical   Suicidal Thoughts:  No  Homicidal Thoughts:  No  Memory:  Immediate;   Fair Recent;   Fair Remote;   limited  Judgement:  Fair  Insight:  Fair  Psychomotor Activity:  Tremor  Concentration:  Concentration: Fair and Attention Span: Fair  Recall:   limited  Fund of Knowledge: Fair  Language: Fair  Akathisia:  No  Handed:  Right  AIMS (if indicated): done  Assets:  Communication Skills Desire for Improvement Housing Intimacy Social Support Transportation  ADL's:  Intact needs support  Cognition: baseline  Sleep:  Fair   Screenings: Administrator, Civil Service Office Visit from 09/04/2022 in [facility] Office Visit from 06/04/2022 in [facility] Visit from 03/02/2022 in [facility] Visit from 12/01/2021 in [facility] from 01/21/2021 in [facility] Total Score 0 0 0 0 0      GAD-7    Flowsheet Row Counselor from 03/25/2021 in [facility] 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 [facility]  Total GAD-7 Score 4 7      PHQ2-9    [place] Visit from 09/04/2022 in [facility] Visit from 08/17/2022 in [facility] Visit from 03/02/2022 in [facility] Visit from 12/23/2021 in [facility] Visit from 10/21/2021 in [facility]  PHQ-2 Total Score 1 0 '1 2 2  '$ PHQ-9 Total Score '6 8 4 6 6      '$ [place] Office Visit from 09/04/2022 in [facility] Office  Visit from 03/02/2022 in [facility] Office Visit from 12/01/2021 in [facility] No Risk No Risk No Risk        Assessment and Plan: Kenzee Bassin is a 76 year old Caucasian female, married, lives in [place], has a history of bipolar disorder, GAD, insomnia, multiple medical problems including Parkinson's disease, hearing loss was evaluated in office today.  Patient is currently improving although she will need more support at home due to physical limitations, cognitive problems, progressing due to her multiple medical problems, being on psychotropic medications as well as Parkinson's disease.  Plan as noted below.  Plan Bipolar disorder in remission Risperidone 0.5 mg p.o. daily in the morning and 0.5 mg at bedtime. Prozac 60 mg p.o. daily  GAD-stable Continue CBT with Ms. Christina Hussami Prozac 60 mg p.o. daily  Insomnia-stable Trazodone 100 mg p.o. nightly Melatonin as needed  Hyperprolactinemia-improving Prolactin level-05/12/2022-70.5 Will monitor closely.  Likely due to psychotropic medication.  Collateral information obtained from spouse who reports patient needs a lot of support at home.  Provided information for caregiver support group, videos.  As well as provided information for PACE, home instead.  Patient also to contact primary care provider for home health.  Follow-up in clinic in 4 months or sooner if needed.  This note was generated in part or whole with voice recognition software. Voice recognition is usually quite accurate but there are transcription errors that can and very often do occur. I apologize for any typographical errors that were not detected and corrected.     Ursula Alert, MD 09/04/2022, 12:27 PM

## 2022-09-04 NOTE — Patient Instructions (Signed)
[email or web address]  Recommend looking into [employer] memory caregiver videos: [email or web address]

## 2022-09-09 ENCOUNTER — Ambulatory Visit: Payer: Self-pay

## 2022-09-09 ENCOUNTER — Telehealth: Payer: Self-pay | Admitting: Family Medicine

## 2022-09-09 NOTE — Telephone Encounter (Signed)
Please advise 

## 2022-09-09 NOTE — Telephone Encounter (Unsigned)
Copied from [place] [phone]. Topic: Referral - Request for Referral >> Sep 09, 2022  9:00 AM Chapman Fitch wrote: Has patient seen PCP for this complaint? No  *If NO, is insurance requiring patient see PCP for this issue before PCP can refer them? Referral for which specialty: Home health aid eval  Preferred provider/office:  Reason for referral: pt needs assistance with showering and shampooing her hair /

## 2022-09-09 NOTE — Telephone Encounter (Signed)
  Chief Complaint: pain with urination Symptoms: see above Frequency: per husband pt told him yesterday  Pertinent Negatives: Patient denies pt asleep during call Disposition: '[]'$ ED /'[]'$ Urgent Care (no appt availability in office) / '[]'$ Appointment(In office/virtual)/ '[]'$  [facility]/ '[]'$ Home Care/ '[]'$ Refused Recommended Disposition /'[]'$ [facility]/ '[x]'$  Follow-up with PCP Additional Notes: Pt's husband stated unable to make appt today or tomorrow- asked if can drop a urine sample off if needed.  Reason for Disposition  Age > 50 years  Answer Assessment - Initial Assessment Questions 1. SEVERITY: "How bad is the pain?"  (e.g., Scale 1-10; mild, moderate, or severe)   - MILD (1-3): complains slightly about urination hurting   - MODERATE (4-7): interferes with normal activities     - SEVERE (8-10): excruciating, unwilling or unable to urinate because of the pain      mild 2. FREQUENCY: "How many times have you had painful urination today?"      N/a 3. PATTERN: "Is pain present every time you urinate or just sometimes?"      Pt asleep 4. ONSET: "When did the painful urination start?"      Pt mentioned to husband yesterday 5. FEVER: "Do you have a fever?" If Yes, ask: "What is your temperature, how was it measured, and when did it start?"     Pt asleep 6. PAST UTI: "Have you had a urine infection before?" If Yes, ask: "When was the last time?" and "What happened that time?"      2 round abx 7. CAUSE: "What do you think is causing the painful urination?"  (e.g., UTI, scratch, Herpes sore)     UTI 8. OTHER SYMPTOMS: "Do you have any other symptoms?" (e.g., blood in urine, flank pain, genital sores, urgency, vaginal discharge)     None reported  9. PREGNANCY: "Is there any chance you are pregnant?" "When was your last menstrual period?"     N/a  Protocols used: Urination Pain - Female-A-AH

## 2022-09-09 NOTE — Telephone Encounter (Signed)
Called patient to see if she wanted to schedule an appt. LMTCB. Okay for [facility] to advise.

## 2022-09-09 NOTE — Telephone Encounter (Signed)
Pts husband wanted to let Dr. B know that the pt is not taking Meloxicam or Tramadol anymore and if they can be taken off of her med list / please advise

## 2022-09-09 NOTE — Telephone Encounter (Signed)
Appt scheduled for 09/10/22.

## 2022-09-10 ENCOUNTER — Ambulatory Visit: Payer: Medicare PPO | Admitting: Physician Assistant

## 2022-09-10 ENCOUNTER — Encounter: Payer: Self-pay | Admitting: Physician Assistant

## 2022-09-10 VITALS — BP 115/68 | HR 76 | Temp 98.8°F | Resp 20 | Wt 222.0 lb

## 2022-09-10 DIAGNOSIS — N390 Urinary tract infection, site not specified: Secondary | ICD-10-CM

## 2022-09-10 LAB — POCT URINALYSIS DIPSTICK
Glucose, UA: NEGATIVE
Ketones, UA: NEGATIVE
Nitrite, UA: NEGATIVE
Protein, UA: POSITIVE — AB
Spec Grav, UA: 1.015 (ref 1.010–1.025)
Urobilinogen, UA: 0.2 E.U./dL
pH, UA: 6.5 (ref 5.0–8.0)

## 2022-09-10 NOTE — Progress Notes (Unsigned)
I,Roshena L Chambers,acting as a Education administrator for [employer], PA-C.,have documented all relevant documentation on the behalf of Mardene Speak, PA-C,as directed by  [employer], PA-C while in the presence of [employer], PA-C.    Established patient visit   Patient: Kendra [name]   DOB: 25-Apr-1946   76 y.o. Female  MRN: [id number] Visit Date: 09/10/2022  Today's healthcare provider: Mardene Speak, PA-C   Chief Complaint  Patient presents with   Dysuria   Subjective    Dysuria  This is a recurrent problem. Pertinent negatives include no nausea or vomiting. She has tried antibiotics (AZO) for the symptoms. The treatment provided no relief.   Patient was seen for UTI on 08/25/2022. She was treated with Nitrofurantoin.  Urinary frequency and urgency to parkinson and medication for parkinson the same but per spouse, noticeable increase in confusion.Repots cloudy urine but did not notice any urine odor. Per chart review, Pt was [facility] by [facility] on [facility]04[facility]. Takes risperidone , prozac, trazodone, melatonin for bipolar, insomnia, GAD Per pt's spouse, pat has been having an elevated urinary frequency due to pt's parkinson's and  Per chart review from 08/28/22, pt is taking sinemet for parkinson's, prolia for osteopenia, femara for stage IA right breast cancer s/p right lumpectomy on 04/27/18.  Medications: Outpatient Medications Prior to Visit  Medication Sig   Acetaminophen 500 MG capsule Take 1,000 mg by mouth every 6 (six) hours as needed.    carbidopa-levodopa (SINEMET IR) 25-100 MG tablet Take 1 tablet by mouth daily in the afternoon. In the morning   cyanocobalamin (,VITAMIN B-12,) 1000 MCG/ML injection INJECT 1ML INTO THE MUSCLE EVERY 30 DAYS   denosumab (PROLIA) 60 MG/ML SOSY injection Inject 60 mg into the skin every 6 (six) months. takes annually   diclofenac Sodium (VOLTAREN) 1 % GEL Apply 4 g topically 4 (four) times daily as needed.   FLUoxetine HCl 60 MG TABS Take 1 tablet by  mouth daily.   gabapentin (NEURONTIN) 100 MG capsule Take 3 capsules (300 mg total) by mouth 3 (three) times daily. (Patient taking differently: Take 200 mg by mouth 3 (three) times daily.)   letrozole (FEMARA) 2.5 MG tablet TAKE ONE TABLET BY MOUTH DAILY   lisinopril-hydrochlorothiazide (ZESTORETIC) 20-12.5 MG tablet TAKE ONE TABLET BY MOUTH DAILY   meloxicam (MOBIC) 15 MG tablet Take 1 tablet (15 mg total) by mouth daily.   nystatin (MYCOSTATIN/NYSTOP) powder Apply 1 application. topically 3 (three) times daily.   polyethylene glycol (MIRALAX / GLYCOLAX) 17 g packet Take 17 g by mouth daily.   risperiDONE (RISPERDAL) 0.5 MG tablet Take 1 tablet (0.5 mg total) by mouth 2 (two) times daily.   traMADol (ULTRAM) 50 MG tablet Take by mouth.   traZODone (DESYREL) 100 MG tablet TAKE ONE TABLET BY MOUTH AT BEDTIME   No facility-administered medications prior to visit.    Review of Systems  Constitutional:  Negative for fever.  Gastrointestinal:  Positive for abdominal pain and constipation. Negative for nausea and vomiting.  Genitourinary:  Positive for dysuria.    {Labs  Heme  Chem  Endocrine  Serology  Results Review (optional):23779}   Objective    BP 115/68 (BP Location: Left Arm, Patient Position: Sitting, Cuff Size: Large)   Pulse 76   Temp 98.8 F (37.1 C) (Oral)   Resp 20   Wt 222 lb (100.7 kg)   SpO2 97% Comment: room air  BMI 35.83 kg/m  {Show previous vital signs (optional):23777}  Physical Exam Vitals  reviewed.  Constitutional:      General: She is not in acute distress.    Appearance: She is well-developed.  HENT:     Head: Normocephalic and atraumatic.  Eyes:     General: No scleral icterus.    Conjunctiva/sclera: Conjunctivae normal.  Cardiovascular:     Rate and Rhythm: Normal rate and regular rhythm.     Heart sounds: Normal heart sounds. No murmur heard. Pulmonary:     Effort: Pulmonary effort is normal. No respiratory distress.     Breath sounds:  Normal breath sounds. No wheezing or rales.  Abdominal:     General: There is no distension.     Palpations: Abdomen is soft.     Tenderness: There is abdominal tenderness in the suprapubic area. There is no guarding or rebound.  Skin:    General: Skin is warm and dry.     Capillary Refill: Capillary refill takes less than 2 seconds.     Findings: No rash.  Neurological:     Mental Status: She is alert and oriented to person, place, and time.     Results for orders placed or performed in visit on 09/10/22  POCT Urinalysis Dipstick  Result Value Ref Range   Color, UA yellow    Clarity, UA cloudy    Glucose, UA Negative Negative   Bilirubin, UA small    Ketones, UA negative    Spec Grav, UA 1.015 1.010 - 1.025   Blood, UA Moderate (non-hemolyzed)    pH, UA 6.5 5.0 - 8.0   Protein, UA Positive (A) Negative   Urobilinogen, UA 0.2 0.2 or 1.0 E.U./dL   Nitrite, UA negative    Leukocytes, UA Moderate (2+) (A) Negative   Appearance     Odor      Assessment & Plan     1. Urinary tract infection without hematuria, site unspecified Could be reinfection or pt needs a longer course of abx or less likely UTI symptoms are due to side effects of her current medications/see HPI Pt does not have fever/flank pain, and could have atypical presentation of her symptoms .  - POCT Urinalysis Dipstick positive for leukocytes - Urine Culture pending Will be waiting for Urine culture results as this is her third episode of UTI We discussed with patient that she might need to switch her empiric abx to different abx which might increase the length of her abx treatment and risk for side effects.  She received two courses of abx on 08/25/22 was treated with macrobid x 5 days. UA was large leuks, + nitrites . Urine culture showed E. Coli and 08/17/22 was treated with keflex for 5 days. Ur culture showed E.coli. Pt was explained her options and she expressed her understanding and agreed to her treatment  plan.   FU PRN     The patient was advised to call back or seek an in-person evaluation if the symptoms worsen or if the condition fails to improve as anticipated.  I discussed the assessment and treatment plan with the patient. The patient was provided an opportunity to ask questions and all were answered. The patient agreed with the plan and demonstrated an understanding of the instructions.  The entirety of the information documented in the History of Present Illness, Review of Systems and Physical Exam were personally obtained by me. Portions of this information were initially documented by the CMA and reviewed by me for thoroughness and accuracy.    Mardene Speak, PAC, [facility] [phone] (phone) [phone] (fax)

## 2022-09-10 NOTE — Telephone Encounter (Signed)
Patient advised.

## 2022-09-10 NOTE — Telephone Encounter (Signed)
A face-to-face visit (can be virtual or in person) is required for ordering home health services.  Can they set up a visit this week and we can get this going?  Also ok to d.c mobic and tramadol from her med list as requested. Thanks!

## 2022-09-14 ENCOUNTER — Other Ambulatory Visit: Payer: Self-pay | Admitting: Physician Assistant

## 2022-09-14 DIAGNOSIS — N390 Urinary tract infection, site not specified: Secondary | ICD-10-CM

## 2022-09-14 LAB — URINE CULTURE

## 2022-09-14 MED ORDER — CEFPODOXIME PROXETIL 200 MG PO TABS: 200.0000 mg | ORAL_TABLET | Freq: Two times a day (BID) | ORAL | 0 refills | Status: DC

## 2022-09-14 NOTE — Progress Notes (Signed)
Spoke with patient's husband

## 2022-09-14 NOTE — Progress Notes (Unsigned)
Please, let pt know that she needs to take antibiotic 200 mg twice daily for 10 to 14 days ; for patients with symptomatic improvement within the first 48 to 72 hours of therapy, some experts recommend shorter courses of 7 to 10 days. If patient is willing, we could place a referral to urology for an evaluation/recurrent UTI. Please, FU with [place] in 7-10 days before completion of your antibiotic therapy. If pt develops any side effects, please, let us know as soon as possible. Please, take probiotics/activia yogurt for ex. And drink plenty of water.

## 2022-09-14 NOTE — Progress Notes (Signed)
Your urine culture results are back. I will send keflex for up to 14 days to your pharmacy. Let me know if you have any concerns or questions.  Mardene Speak, [facility], MMS [facility] [phone])

## 2022-09-28 ENCOUNTER — Ambulatory Visit (INDEPENDENT_AMBULATORY_CARE_PROVIDER_SITE_OTHER): Payer: Medicare PPO | Admitting: Licensed Clinical Social Worker

## 2022-09-28 DIAGNOSIS — F3176 Bipolar disorder, in full remission, most recent episode depressed: Secondary | ICD-10-CM | POA: Diagnosis not present

## 2022-09-28 DIAGNOSIS — F411 Generalized anxiety disorder: Secondary | ICD-10-CM

## 2022-09-28 NOTE — Progress Notes (Signed)
Virtual Visit via Video Note  I connected with Kendra [name] on 09/28/22 at  1:00 PM EST by a video enabled telemedicine application and verified that I am speaking with the correct person using two identifiers.  Location: Patient: home/personal vehicle Provider: remote office [place], [place])   I discussed the limitations of evaluation and management by telemedicine and the availability of in person appointments. The patient expressed understanding and agreed to proceed.  I discussed the assessment and treatment plan with the patient. The patient was provided an opportunity to ask questions and all were answered. The patient agreed with the plan and demonstrated an understanding of the instructions.   The patient was advised to call back or seek an in-person evaluation if the symptoms worsen or if the condition fails to improve as anticipated.  I provided 35 minutes of non-face-to-face time during this encounter.   Reznor Ferrando R [name], LCSW  THERAPIST PROGRESS NOTE  Session Time: 1-135p  Participation Level: Active  Behavioral Response: NeatAlertAnxious  Type of Therapy: Individual Therapy  Treatment Goals addressed:    Problem: Reduce overall frequency, intensity, and duration of the anxiety so that daily functioning is not impaired Goal: LTG: Patient will score less than 5 on the Generalized Anxiety Disorder 7 Scale (GAD-7) Outcome: Progressing Goal: STG: Patient will participate in at least 80% of scheduled individual psychotherapy sessions Outcome: Progressing Intervention: Discuss self-management skills Note: Reviewed      ProgressTowards Goals: Progressing  Interventions: CBT, DBT, and Supportive   Summary: Kendra [name] is a 76 y.o. female who presents with improving symptoms related to depression and anxiety.   Allowed pt to explore and express thoughts and feelings associated with recent life situations and external stressors.  Patient reports that she  recently went to [place] to visit some of her husband's family members for Thanksgiving. Patient reports that she had a very good time there, and felt comfortable with everyone. Patient reports that she does feel some fatigue, but feels that that is a normal reaction to being around a lot of people. Patient reports that overall mood has been stable--patient denies any depression or anxiety symptoms recently. Patient reports good quality and quantity of sleep. Patient denies any negative thoughts about self. Patient reports that relationships are good with her husband and extended family members. Patient reports that she tries to engage socially as much as she can. Patient reports that she is continuing to exercise on a regular basis--patient walks her driveway. Discussed stimulating activities and patient reports that she enjoys doing puzzles, and will frequently engage in this behavior. Patient reports that there are not any specific concerns or any additional topics that she would like to focus on today. Patient reports that her and husband arrived at a restaurant to eat lunch, so patient requested to end session.  Continued recommendations are as follows: self care behaviors, positive social engagements, focusing on overall work/home/life balance, and focusing on positive physical and emotional wellness.  .  Suicidal/Homicidal: No  Therapist Response: Pt is continuing to apply interventions learned in session into daily life situations. Pt is currently on track to meet goals utilizing interventions mentioned above. Treatment to continue as indicated.  Plan: Return again in 8 weeks.   Encounter Diagnoses  Name Primary?   Bipolar disorder, in full remission, most recent episode depressed ([place]) Yes   GAD (generalized anxiety disorder)    Collaboration of Care: Other pt to continue care with psychiatrist of record,Dr. Shea Evans  Patient/Guardian was advised Release of Information  must be obtained prior  to any record release in order to collaborate their care with an outside provider. Patient/Guardian was advised if they have not already done so to contact the registration department to sign all necessary forms in order for [place] to release information regarding their care.   Consent: Patient/Guardian gives verbal consent for treatment and assignment of benefits for services provided during this visit. Patient/Guardian expressed understanding and agreed to proceed.   [place], LCSW 09/28/2022

## 2022-09-29 ENCOUNTER — Ambulatory Visit: Payer: Medicare PPO | Attending: Family Medicine

## 2022-09-29 ENCOUNTER — Ambulatory Visit: Payer: Medicare PPO | Admitting: Speech Pathology

## 2022-09-29 DIAGNOSIS — R471 Dysarthria and anarthria: Secondary | ICD-10-CM

## 2022-09-29 DIAGNOSIS — R262 Difficulty in walking, not elsewhere classified: Secondary | ICD-10-CM

## 2022-09-29 DIAGNOSIS — R2681 Unsteadiness on feet: Secondary | ICD-10-CM | POA: Diagnosis not present

## 2022-09-29 DIAGNOSIS — R41841 Cognitive communication deficit: Secondary | ICD-10-CM

## 2022-09-29 DIAGNOSIS — R278 Other lack of coordination: Secondary | ICD-10-CM | POA: Diagnosis not present

## 2022-09-29 DIAGNOSIS — M25552 Pain in left hip: Secondary | ICD-10-CM | POA: Diagnosis not present

## 2022-09-29 DIAGNOSIS — M25551 Pain in right hip: Secondary | ICD-10-CM

## 2022-09-29 DIAGNOSIS — M6281 Muscle weakness (generalized): Secondary | ICD-10-CM

## 2022-09-29 NOTE — Plan of Care (Signed)
  Problem: Reduce overall frequency, intensity, and duration of the anxiety so that daily functioning is not impaired Goal: LTG: Patient will score less than 5 on the Generalized Anxiety Disorder 7 Scale (GAD-7) Outcome: Progressing Goal: STG: Patient will participate in at least 80% of scheduled individual psychotherapy sessions Outcome: Progressing Intervention: Discuss self-management skills Note: Reviewed

## 2022-09-29 NOTE — Therapy (Unsigned)
OUTPATIENT SPEECH LANGUAGE PATHOLOGY LSVT LOUD EVALUATION   Patient Name: Kendra [name] MRN: [id number] DOB:1945/11/23, 76 y.o., female Today's Date: 09/29/2022  PCP: Lavon Paganini, MD REFERRING PROVIDER: Tally Joe, FNP   End of Session - 09/29/22 1110     Visit Number 1    Number of Visits 17    Date for SLP Re-Evaluation 11/28/22    SLP Start Time 1105    SLP Stop Time  1200    SLP Time Calculation (min) 55 min    Activity Tolerance Patient tolerated treatment well             Past Medical History:  Diagnosis Date   Anxiety    Breast cancer ([facility]) 03/2018   right breast cancer at 11:00 and 1:00   Bursitis    bilateral hips and knees   Complication of anesthesia    Depression    Family history of breast cancer 03/24/2018   Family history of lung cancer 03/24/2018   History of alcohol dependence ([facility]) 2007   no ETOH; resolved since 2007   History of hiatal hernia    History of psychosis 2014   due to sleep disturbance   Hypertension    Parkinson disease    Personal history of radiation therapy 06/2018-07/2018   right breast ca   PONV (postoperative nausea and vomiting)    Past Surgical History:  Procedure Laterality Date   BREAST BIOPSY Right 03/14/2018   11:00 DCIS and invasive ductal carcinoma   BREAST BIOPSY Right 03/14/2018   1:00 Invasive ductal carcinoma   BREAST LUMPECTOMY Right 04/27/2018   lumpectomy of 11 and 1:00 cancers, clear margins, negative LN   BREAST LUMPECTOMY WITH RADIOACTIVE SEED AND SENTINEL LYMPH NODE BIOPSY Right 04/27/2018   Procedure: RIGHT BREAST LUMPECTOMY WITH RADIOACTIVE SEED X 2 AND RIGHT SENTINEL LYMPH NODE BIOPSY;  Surgeon: [name], MD;  Location: [facility];  Service: General;  Laterality: Right;   CATARACT EXTRACTION W/PHACO Left 08/30/2019   Procedure: CATARACT EXTRACTION PHACO AND INTRAOCULAR LENS PLACEMENT ([facility]) LEFT panoptix toric  01:20.5  12.7%  10.26;  Surgeon: Leandrew Koyanagi, MD;   Location: [facility];  Service: Ophthalmology;  Laterality: Left;   CATARACT EXTRACTION W/PHACO Right 09/20/2019   Procedure: CATARACT EXTRACTION PHACO AND INTRAOCULAR LENS PLACEMENT ([facility]) RIGHT 5.71  00:36.4  15.7%;  Surgeon: Leandrew Koyanagi, MD;  Location: [facility];  Service: Ophthalmology;  Laterality: Right;   LAPAROTOMY N/A 02/10/2018   Procedure: EXPLORATORY LAPAROTOMY;  Surgeon: Isabel Caprice, MD;  Location: [facility];  Service: Gynecology;  Laterality: N/A;   MASS EXCISION  01/2018   abdominal   OVARIAN CYST REMOVAL     SALPINGOOPHORECTOMY Bilateral 02/10/2018   Procedure: BILATERAL SALPINGO OOPHORECTOMY; PERITONEAL WASHINGS;  Surgeon: Isabel Caprice, MD;  Location: [facility];  Service: Gynecology;  Laterality: Bilateral;   TONSILLECTOMY     TONSILLECTOMY AND ADENOIDECTOMY  1953   Patient Active Problem List   Diagnosis Date Noted   Blepharitis of upper and lower eyelids of both eyes 08/17/2022   Subacute cough 08/17/2022   History of allergy to shellfish 02/24/2022   Chronic low back pain of over 3 months duration 02/04/2022   Osteoarthritis of  AC (acromioclavicular) joint (Left) 02/04/2022   Osteoarthritis of hip (Right) 02/04/2022   Impaction fracture of distal end of radius and ulna, sequela (Left) 02/04/2022   Osteoarthritis of 1st (thumb) carpometacarpal (CMC) joint of hand (Left) 02/04/2022   Lumbosacral facet arthropathy (Multilevel) (Bilateral) (T12-S1)  02/04/2022   Lumbar central spinal stenosis w/o neurogenic claudication (L2-3, L3-4, L4-5) 02/04/2022   Lumbar foraminal stenosis (Bilateral: L2-3, L4-5) (Right: (Severe) L4-5) 02/04/2022   Lumbar lateral recess stenosis (Bilateral: L2-3, L3-4, and L4-5) (Severe) 02/04/2022   DDD (degenerative disc disease), lumbar 02/04/2022   Lumbosacral facet syndrome (Multilevel) (Bilateral) 02/04/2022   Osteoarthritis of lumbar spine 02/04/2022   Erythema ab igne 12/23/2021   Allergic conjunctivitis of both  eyes 12/23/2021   Chronic hip pain (3ry area of Pain) (Bilateral) (L>R) 12/22/2021   Chronic lower extremity pain (2ry area of Pain) (Bilateral) (L>R) 12/22/2021   Abnormal MRI, lumbar spine (01/31/2020) 12/22/2021   Grade 1 Anterolisthesis of lumbar spine (L3/L4) & (5 mm) (L4/L5) 12/22/2021   Chronic pain syndrome 11/17/2021   High risk medication use 11/17/2021   Disorder of skeletal system 11/17/2021   Problems influencing health status 11/17/2021   Other hyperlipidemia 10/21/2021   Polypharmacy 08/12/2021   Chronic low back pain (1ry area of Pain) (Bilateral) (L>R) w/o sciatica 06/18/2021   Elevated blood pressure reading 05/23/2021   Osteopenia of neck of femur (Right) 09/06/2020   Obesity 08/29/2020   Parkinson disease 02/22/2020   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   Bipolar disorder, in full remission, most recent episode depressed ([facility]) 11/08/2019   Vitamin D insufficiency 08/24/2019   Insomnia due to mental condition 05/31/2019   Bipolar I disorder, most recent episode depressed ([facility]) 05/31/2019   Alcohol use disorder, moderate, in sustained remission ([facility]) 05/31/2019   Elevated tumor markers 05/27/2019   GAD (generalized anxiety disorder) 04/10/2019   MDD (major depressive disorder), severe ([facility]) 01/31/2019   High serum carbohydrate antigen 19-9 (CA19-9) 12/01/2018   Adjustment disorder with anxious mood 11/24/2018   Essential hypertension 11/24/2018   B12 deficiency 10/17/2018   Incisional hernia, without obstruction or gangrene 09/12/2018   Genetic testing 04/07/2018   Family history of breast cancer 03/24/2018   Family history of lung cancer 03/24/2018   Malignant neoplasm of upper-outer quadrant of right breast in female, estrogen receptor positive ([facility]) 03/17/2018   Malignant neoplasm of upper-inner quadrant of right breast in female, estrogen receptor positive ([facility] [facility]) 03/17/2018   History of psychosis 02/07/2018   History of insomnia 02/07/2018    Onset date: PD dx  2021  REFERRING DIAG: Parkinson's disease  THERAPY DIAG:  Dysarthria and anarthria  Cognitive communication deficit  Rationale for Evaluation and Treatment Rehabilitation  SUBJECTIVE:   SUBJECTIVE STATEMENT: "The Parkinson's slows me down."  Pt accompanied by:  Jerrye Noble  PERTINENT HISTORY: Patient is a 76 y.o. female with history of Parkinson's disease (dx 2021), anxiety, depression, psychosis, HTN, bipolar disorder, alcoholism, memory issues since 2005, hx head trauma. Referred for LSVT BIG and LOUD; known to our clinic from prior course of therapy completed in 2022.  DIAGNOSTIC FINDINGS: MRI Brain 08/08/2018: "1. No acute intracranial abnormality and largely unremarkable for age gray and white matter signal. 2. NeuroQuant volumetric analysis of the brain suggests age advanced brain volume loss, see details on [facility] PACS."  PAIN:  Are you having pain? Yes: NPRS scale: 7/10 Pain location: left hip and left leg   FALLS: Has patient fallen in last 6 months? Yes, Number of falls: 1  LIVING ENVIRONMENT: Lives with: lives with their spouse Lives in: House/apartment  PLOF: Independent with basic ADLs  PATIENT GOALS communicate better  OBJECTIVE:   COGNITION: Overall cognitive status: History of cognitive impairments - at baseline Areas of impairment: memory, wordfinding  SOCIAL HISTORY: Occupation: retired Building control surveyor intake: optimal  Caffeine/alcohol intake: minimal Daily voice use: minimal  ORAL MOTOR EXAMINATION Facial : Comment: Lingual: Comment: tremor Velum: WFL Mandible: WFL Cough: WFL Voice: Other: low vocal intensity    Patient does not report difficulty swallowing which does not warrant further evaluation  PERCEPTUAL VOICE ASSESSMENT: Voice quality: hoarse and low vocal intensity Vocal abuse:  n/a Resonance: normal Respiratory function: thoracic breathing   LSVT-LOUD VOICE EVALUATION: Maximum phonation time for sustained "ah": 6.7 sec Mean  intensity during sustained "ah": 73 dB  Mean fundamental frequency during sustained "ah": 170 Hz (2.7 STD below average of 244 Hz +/- 27 for age and gender) Mean intensity sustained during conversational speech: 69 dB Habitual pitch: 149 Hz Highest dynamic pitch when altering pitch from a low note to a high note: 251 Hz Lowest dynamic pitch when altering from a high note to a low note: 146 Hz Highest dynamic pitch during conversational speech: 196 Hz Lowest dynamic pitch during conversational speech: 109 Hz Average time patient was able to sustain /s/: 4.1 seconds Average time patient was able to sustain /z/: 8.4 seconds s/z ratio : 0.49   Speech is characterized by Hypophonia, Vocal tremor, and hoarse vocal quality. For trained listener in quiet environment with context, intelligibility was 100%.   Patient able to improve all parameters with model ("Loud like me") Stimulability: Improved vocal quality with loud voice (80 dB) for sustained vowel, maximum high and low phonation improved to range 175-318 Hz, and functional phrases averaged 73 dB with mod-max cues. Improved pitch range with model.     PATIENT REPORTED OUTCOME  The Communication Effectiveness Survey is a patient-reported outcome measure in which the patient rates their own effectiveness in different communication situations. A higher score indicates greater effectiveness.   Having a conversation with family member or friends at home 3 Participating in a conversation with strangers in a quiet place 3 Conversing a familiar person over the phone 3 Conversing with a stranger over the phone 1 Being part of a conversation in a noisy environment (social gathering) 2 Speaking to a friend when emotionally upset or angry 1 Having a conversation while traveling in a car 2 Having a conversation with someone at a distance (across a room) 3  Pt's self-rating was 18/32.    PATIENT EDUCATION: Education details: course of therapy,  therapy goals, decrease in loudness measurements since d/c from therapy in 2022 Person educated: Patient and Spouse Education method: Explanation and Verbal cues Education comprehension: verbalized understanding   HOME EXERCISE PROGRAM: To be provided next session     GOALS: Goals reviewed with patient? Yes  SHORT TERM GOALS: Target date: 10 sessions  The patient will complete Daily Tasks (Maximum duration "ah", High/Lows, and Functional Phrases) at average loudness >/= 85 dB and with loud, good quality voice with min cues.  Baseline: Goal status: INITIAL  2.  The patient will complete Hierarchal Speech Loudness reading drills (words/phrases, sentences) at average >/= 75 dB and with loud, good quality voice with min cues. Baseline:  Goal status: INITIAL  3.  The patient will participate in 5-8 minutes conversation, maintaining average loudness of 75 dB and loud, good quality voice with min cues. Baseline:  Goal status: INITIAL   LONG TERM GOALS: Target date:   The patient will complete Daily Tasks (Maximum duration "ah", High/Lows, and Functional Phrases) at average loudness >/= 85 dB and with loud, good quality voice Baseline:  Goal status: INITIAL  2.  The patient will complete Hierarchal Speech Loudness reading drills (  words/phrases, sentences, and paragraph) at average >/= 75 dB and with loud, good quality voice. Baseline:  Goal status: INITIAL  3.  The patient will participate in 15-20 minutes conversation, maintaining average loudness of >/= 75 dB and loud, good quality voice. Baseline:  Goal status: INITIAL  4.  Patient will report improved communication effectiveness as measured by Communicative Effectiveness Survey.  Baseline: 18/32 on 09/29/22 Goal status: INITIAL   ASSESSMENT:  CLINICAL IMPRESSION: Patient is a 76 y.o. 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 She presents with overall  mild-moderate dysarthria characterized by reduced vocal intensity, reduced pitch variability, hoarse vocal quality, and subjectively low pitch for gender. Patient and spouse report noting changes in her vocal quality and ability to project her voice. She was stimulable for improvements in intensity, vocal quality, and pitch range with clinician model and cues for "Loud like me." I recommend course of skilled ST for LSVT-LOUD to improve vocal quality and intensity for communicating with family and friends.   OBJECTIVE IMPAIRMENTS include dysarthria and voice disorder. These impairments are limiting patient from effectively communicating at home and in community. Factors affecting potential to achieve goals and functional outcome are ability to learn/carryover information and previous level of function.. Patient will benefit from skilled SLP services to address above impairments and improve overall function.  REHAB POTENTIAL: Good  PLAN: SLP FREQUENCY: 4x/week  SLP DURATION: 4 weeks  PLANNED INTERVENTIONS: Cueing hierachy, Functional tasks, SLP instruction and feedback, Compensatory strategies, Patient/family education, and LSVT-LOUD      Deneise Lever, MS, Actor [phone]

## 2022-09-29 NOTE — Therapy (Signed)
OUTPATIENT PHYSICAL THERAPY LSVT BIG EVALUATION   Patient Name: Kendra [name] MRN: [id number] DOB:[birth year], 76 y.o., female Today's Date: 09/30/2022  PCP: Virginia Crews, MD REFERRING PROVIDER: Virginia Crews, MD  END OF SESSION:  PT End of Session - 09/30/22 0935     Visit Number 1    Number of Visits 17    Date for PT Re-Evaluation 11/10/22    PT Start Time 8099    PT Stop Time 1102    PT Time Calculation (min) 60 min    Equipment Utilized During Treatment Gait belt    Activity Tolerance Patient tolerated treatment well;Patient limited by pain    Behavior During Therapy WFL for tasks assessed/performed             Past Medical History:  Diagnosis Date   Anxiety    Breast cancer ([place]) 03/2018   right breast cancer at 11:00 and 1:00   Bursitis    bilateral hips and knees   Complication of anesthesia    Depression    Family history of breast cancer 03/24/2018   Family history of lung cancer 03/24/2018   History of alcohol dependence ([place]) 2007   no ETOH; resolved since 2007   History of hiatal hernia    History of psychosis 2014   due to sleep disturbance   Hypertension    Parkinson disease    Personal history of radiation therapy 06/2018-07/2018   right breast ca   PONV (postoperative nausea and vomiting)    Past Surgical History:  Procedure Laterality Date   BREAST BIOPSY Right 03/14/2018   11:00 DCIS and invasive ductal carcinoma   BREAST BIOPSY Right 03/14/2018   1:00 Invasive ductal carcinoma   BREAST LUMPECTOMY Right 04/27/2018   lumpectomy of 11 and 1:00 cancers, clear margins, negative LN   BREAST LUMPECTOMY WITH RADIOACTIVE SEED AND SENTINEL LYMPH NODE BIOPSY Right 04/27/2018   Procedure: RIGHT BREAST LUMPECTOMY WITH RADIOACTIVE SEED X 2 AND RIGHT SENTINEL LYMPH NODE BIOPSY;  Surgeon: [name], MD;  Location: [facility];  Service: General;  Laterality: Right;   CATARACT EXTRACTION W/PHACO Left 08/30/2019    Procedure: CATARACT EXTRACTION PHACO AND INTRAOCULAR LENS PLACEMENT ([place]) LEFT panoptix toric  01:20.5  12.7%  10.26;  Surgeon: Leandrew Koyanagi, MD;  Location: [facility];  Service: Ophthalmology;  Laterality: Left;   CATARACT EXTRACTION W/PHACO Right 09/20/2019   Procedure: CATARACT EXTRACTION PHACO AND INTRAOCULAR LENS PLACEMENT ([facility]) RIGHT 5.71  00:36.4  15.7%;  Surgeon: Leandrew Koyanagi, MD;  Location: [facility];  Service: Ophthalmology;  Laterality: Right;   LAPAROTOMY N/A 02/10/2018   Procedure: EXPLORATORY LAPAROTOMY;  Surgeon: Isabel Caprice, MD;  Location: [facility];  Service: Gynecology;  Laterality: N/A;   MASS EXCISION  01/2018   abdominal   OVARIAN CYST REMOVAL     SALPINGOOPHORECTOMY Bilateral 02/10/2018   Procedure: BILATERAL SALPINGO OOPHORECTOMY; PERITONEAL WASHINGS;  Surgeon: Isabel Caprice, MD;  Location: [facility];  Service: Gynecology;  Laterality: Bilateral;   TONSILLECTOMY     TONSILLECTOMY AND ADENOIDECTOMY  1953   Patient Active Problem List   Diagnosis Date Noted   Blepharitis of upper and lower eyelids of both eyes 08/17/2022   Subacute cough 08/17/2022   History of allergy to shellfish 02/24/2022   Chronic low back pain of over 3 months duration 02/04/2022   Osteoarthritis of  AC (acromioclavicular) joint (Left) 02/04/2022   Osteoarthritis of hip (Right) 02/04/2022   Impaction fracture of distal end of radius  and ulna, sequela (Left) 02/04/2022   Osteoarthritis of 1st (thumb) carpometacarpal [facility]) joint of hand (Left) 02/04/2022   Lumbosacral facet arthropathy (Multilevel) (Bilateral) (T12-S1) 02/04/2022   Lumbar central spinal stenosis w/o neurogenic claudication (L2-3, L3-4, L4-5) 02/04/2022   Lumbar foraminal stenosis (Bilateral: L2-3, L4-5) (Right: (Severe) L4-5) 02/04/2022   Lumbar lateral recess stenosis (Bilateral: L2-3, L3-4, and L4-5) (Severe) 02/04/2022   DDD (degenerative disc disease), lumbar 02/04/2022   Lumbosacral facet  syndrome (Multilevel) (Bilateral) 02/04/2022   Osteoarthritis of lumbar spine 02/04/2022   Erythema ab igne 12/23/2021   Allergic conjunctivitis of both eyes 12/23/2021   Chronic hip pain (3ry area of Pain) (Bilateral) (L>R) 12/22/2021   Chronic lower extremity pain (2ry area of Pain) (Bilateral) (L>R) 12/22/2021   Abnormal MRI, lumbar spine (01/31/2020) 12/22/2021   Grade 1 Anterolisthesis of lumbar spine (L3/L4) & (5 mm) (L4/L5) 12/22/2021   Chronic pain syndrome 11/17/2021   High risk medication use 11/17/2021   Disorder of skeletal system 11/17/2021   Problems influencing health status 11/17/2021   Other hyperlipidemia 10/21/2021   Polypharmacy 08/12/2021   Chronic low back pain (1ry area of Pain) (Bilateral) (L>R) w/o sciatica 06/18/2021   Elevated blood pressure reading 05/23/2021   Osteopenia of neck of femur (Right) 09/06/2020   Obesity 08/29/2020   Parkinson disease 02/22/2020   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   Bipolar disorder, in full remission, most recent episode depressed ([place]) 11/08/2019   Vitamin D insufficiency 08/24/2019   Insomnia due to mental condition 05/31/2019   Bipolar I disorder, most recent episode depressed ([place]) 05/31/2019   Alcohol use disorder, moderate, in sustained remission ([place]) 05/31/2019   Elevated tumor markers 05/27/2019   GAD (generalized anxiety disorder) 04/10/2019   MDD (major depressive disorder), severe ([place]) 01/31/2019   High serum carbohydrate antigen 19-9 (CA19-9) 12/01/2018   Adjustment disorder with anxious mood 11/24/2018   Essential hypertension 11/24/2018   B12 deficiency 10/17/2018   Incisional hernia, without obstruction or gangrene 09/12/2018   Genetic testing 04/07/2018   Family history of breast cancer 03/24/2018   Family history of lung cancer 03/24/2018   Malignant neoplasm of upper-outer quadrant of right breast in female, estrogen receptor positive ([place]) 03/17/2018   Malignant neoplasm of upper-inner quadrant of right  breast in female, estrogen receptor positive ([place]) 03/17/2018   History of psychosis 02/07/2018   History of insomnia 02/07/2018    ONSET DATE: Pt diagnosed with PD in 2019  REFERRING DIAG:  G20 (ICD-10-CM) - Parkinson's disease      THERAPY DIAG:  Difficulty in walking, not elsewhere classified  Muscle weakness (generalized)  Other lack of coordination  Unsteadiness on feet  Pain in right hip  Pain in left hip  Rationale for Evaluation and Treatment: Rehabilitation  SUBJECTIVE:  SUBJECTIVE STATEMENT: Pt known to PT and returns for LSVT BIG treatment (last seen by [facility] Sept-Oct 2022 for LSVT BIG). Pt spouse provides majority of history as pt has difficulty providing answers with questions/memory difficulty. Pt had HH PT since discharge from previous LSVT (last appt was 6 months ago, however). Pt spouse reports pt's chronic pain has led her to be increasingly sedentary and her general mobility has decreased. Pt was being followed by pain clinic, spouse reports she does not currently have another scheduled appointment.  Pt accompanied by: significant other husband [name]  PERTINENT HISTORY:  Pt is a pleasant 76 yo female who was diagnosed with PD in 2019. She is known to clinic and [facility], and was last seen for LSVT BIG Sept-Oct 2022. Pt with chronic pain and has become increasingly sedentary as a result exacerbating mobility difficulties due to PD. Current concerns: Pt reports L and R hip pain and back pain. Pt was being seen by pain management clinic. Pt has had injections that provided relief at the time. Pt's spouse reports her physician determined surgery was not an option for her pain so the plan is to manage it. Pt spouse thinks pain has resulted in reluctance for pt to move. Pt currently  sedentary, doesn't walk much, but when she does she holds on to furniture for support due to balance problems. When pt first stands she must hold on to something due to mix of pain and/or dizziness (described as lightheadedness). Her pain can go from 3/10 to 9/10. It does improve once she starts moving. Pt has increased difficulty with multiple activities including: getting in and out of her bed, getting in and out of her car, freezing, decreased gait speed, decreased balance and cognition, and difficulty using BUE due to tremors.  Per chart other PMH includes hx of breast cancer, HTN, MCI, insomnia, cataracts, bipolar 1, alcohol use disorder, GAD, MDD, osteopenia of neck of R femur, chronic B low back pain with radiating pain to buttock and posterior thighs and calves. Pt with multiple comorbidities, please refer to chart for full details    PAIN:  Are you having pain? Yes: NPRS scale: not rated currently but can reach 9/10 Pain location: L hip and L leg Pain description: pt has difficulty describing Aggravating factors: unsure Relieving factors: laying in the recliner helps, movement  PRECAUTIONS: Fall  WEIGHT BEARING RESTRICTIONS: No  FALLS: Has patient fallen in last 6 months? Yes. Number of falls 1x, leg tangled in chair leg when getting up from a chair  LIVING ENVIRONMENT: Lives with: lives with their spouse Lives in: House/apartment Stairs:  Has following equipment at home:  pt not currently using AD, holds onto walls/furniture, does have walker and SPC at home  PLOF: Independent with basic ADLs  PATIENT GOALS: improve mobility  IMAGING: via chart  07/24/22 DG BONE density " ASSESSMENT: The probability of a major osteoporotic fracture is 15.8% within the next ten years.   The probability of a hip fracture is 2.9% within the next ten years."  12/22/21 DG lumbar spine " IMPRESSION: 1. No acute displaced fracture or traumatic listhesis of the lumbar spine. 2. Interval  development of grade 1 anterolisthesis of L3 on L4. Stable grade 1 anterolisthesis of L4 on L5. Appears stable on flexion extension views. 3.  Aortic Atherosclerosis (ICD10-I70.0)."  Please refer to chart for full details  OBJECTIVE:   LSVT BIG EVALUATION & EXAMINATION   Neurological and Other Medical Information:   Pt known to clinic  some answers via chart (2022) What were your initial symptoms of Parkinson's disease? first diagnosed 2019   Do you have tremors?  Yes: R hand   Do you have any pain? Yes: B hip and back, refer to above    Medical Information:    Medication for Parkinson's disease: sinemet   Motor Symptoms:    When did you first start to notice changes in your movement you associate with Parkinson's disease? Recent changes following d/c from previous LSVT and HH - increasingly sedentary due to pain per spouse   What are your current symptoms? Difficulty getting out of a chair, sometimes has difficulty getting in and out of bed, difficult with walking, still dresses herself but is mod I and sometimes must sit to dress, requires assistance with bathing, needs assistance with meal prep (due to balance and difficulty using her hands), has general difficulty with sequencing tasks/activities   What do you do when you want to move the best you possibly can? Pt does not know    Has Parkinson's disease caused you to move less or be less active? Yes: feels most recent contribution is due to pain, freezing  Have you noticed if your movement is slower than it used to be? For example, walking, getting dressed, doing household chores, bathing, etc. Yes: pt spouse reports difficulty maintaining at home exercises following discharge   Have you or others noticed any changes in your posture? Yes: pt notices she doesn't always stand up straight   How many (if any) falls have you had in the last six months? 1x     Have you noticed any freezing with your movement? Yes:     Are  there some activities you now need help with because of your Parkinson's disease? See above     Assessment:     MMT: deferred due to time   Donning jacket: 17 seconds Doffing jacket: 7 seconds   Opening jar: 5.7 seconds with multiple attempts   Sit to stand assessment:  5x STS: 27 seconds    Gait assessments:  10 MWT:   0.58 m/s with rollator     Functional Component movements:   Sit to stand (required) Donning jacket (arm through sleeve) Opening jar Stepping over and on to obstacle (simulate getting in car simulation) Big and tall posture (stand BIG)   Hierarchies Getting in and out of bed 2. Ambulating for distance and over uneven surfaces/obstacles with rollator or LRD    PATIENT SURVEYS:  FOTO 40 (goal 49) - filled out by spouse due to pt impaired cognition   PATIENT EDUCATION:  Education details: exam findings, indications, plan Person educated: Patient and Spouse Education method: Explanation Education comprehension: verbalized understanding   TODAY'S TREATMENT:  DATE: eval only  HOME EXERCISE PROGRAM:  LSVT BIG protocol to be initiated next session  GOALS: Goals reviewed with patient? Yes  SHORT TERM GOALS: Target date: 10/27/2022    Patient will be independent in home exercise program to improve strength/mobility for better functional independence with ADLs. Baseline: LSVT BIG protocol to be initiated next session Goal status: INITIAL   LONG TERM GOALS: Target date: 11/11/2022   Patient will increase FOTO score to equal to or greater than 49 to demonstrate statistically significant improvement in mobility and quality of life.  Baseline: 40 Goal status: INITIAL  2.  Patient (> 68 years old) will complete five times sit to stand test in < 15 seconds indicating decreased fall risk.  Baseline: 27 sec Goal  status: INITIAL  3.  Patient will increase 10 meter walk test to >1.54ms with WFL B step-length, upright posture for >90% of test to improve ease with gait and to override hypokentic and bradykinetic movement.  Baseline: 0.58 m/s with 4WW Goal status: INITIAL  4.  Patient will reduce time to don jacket by at least 5 seconds in order to increase ease with dressing and override bradykinetic movement Baseline: 17 seconds Goal status: INITIAL  5.  Pt will demo or report ability to open standard jar using only 1-2 attempts in order to exhibit improved functional mobility for ADLs.  Baseline: 5.7 seconds with multiple attempts to turn jar lid to open Goal status: INITIAL  ASSESSMENT:  CLINICAL IMPRESSION: Patient is a pleasant 76y.o. female who was seen today for LSVT BIG physical therapy evaluation due to decreased mobility due to PD and chronic pain.  Exam findings indicate pt with bradykinetic and hypokinetic movement, impaired posture and cognition, decreased gait speed and abnormal gait mechanics, as well as decreased strength and increased fall risk AEB performance on 5xSTS and 10MWT. Pt reports and demonstrates general difficulty with ADLs, including dressing, bed mobility, UE tasks, and overall functional mobility. Chronic hip and back pain are contributing factors in addition to PD to decline in mobility since last seen in clinic. The pt will benefit from LSVT BIG PT in order to address these deficits to improve overall functional mobility, QOL and to decrease fall risk.  OBJECTIVE IMPAIRMENTS: Abnormal gait, decreased balance, decreased cognition, decreased coordination, decreased endurance, decreased knowledge of use of DME, decreased mobility, difficulty walking, decreased strength, dizziness, hypomobility, impaired UE functional use, improper body mechanics, postural dysfunction, and pain.   ACTIVITY LIMITATIONS: lifting, bending, standing, squatting, stairs, transfers, bed mobility,  bathing, dressing, hygiene/grooming, and locomotion level  PARTICIPATION LIMITATIONS: meal prep, cleaning, laundry, medication management, personal finances, driving, shopping, community activity, and yard work  PERSONAL FACTORS: Age, Fitness, Sex, Time since onset of injury/illness/exacerbation, and 3+ comorbidities: PMH includes hx of breast cancer, HTN, MCI, insomnia, cataracts, bipolar 1, alcohol use disorder, GAD, MDD, osteopenia of neck of R femur, chronic B low back pain with radiating pain to buttock and posterior thighs and calves. Pt with multiple comorbidities, please refer to chart for full details  are also affecting patient's functional outcome.   REHAB POTENTIAL: Fair    CLINICAL DECISION MAKING: Evolving/moderate complexity  EVALUATION COMPLEXITY: High  PLAN:  PT FREQUENCY: 4x/week  PT DURATION: 6 weeks  PLANNED INTERVENTIONS: Therapeutic exercises, Therapeutic activity, Neuromuscular re-education, Balance training, Gait training, Patient/Family education, Self Care, Joint mobilization, Stair training, Vestibular training, Canalith repositioning, Orthotic/Fit training, DME instructions, Wheelchair mobility training, Spinal mobilization, Cryotherapy, Moist heat, Splintting, Taping, Manual therapy, and Re-evaluation  PLAN  FOR NEXT SESSION: initiate LSVT BIG HEP   Zollie Pee, PT 09/30/2022, 10:19 AM

## 2022-10-05 ENCOUNTER — Ambulatory Visit: Payer: Medicare PPO | Admitting: Speech Pathology

## 2022-10-05 ENCOUNTER — Ambulatory Visit: Payer: Medicare PPO | Attending: Family Medicine

## 2022-10-05 DIAGNOSIS — R42 Dizziness and giddiness: Secondary | ICD-10-CM | POA: Diagnosis present

## 2022-10-05 DIAGNOSIS — M6281 Muscle weakness (generalized): Secondary | ICD-10-CM | POA: Diagnosis not present

## 2022-10-05 DIAGNOSIS — R471 Dysarthria and anarthria: Secondary | ICD-10-CM | POA: Insufficient documentation

## 2022-10-05 DIAGNOSIS — R41841 Cognitive communication deficit: Secondary | ICD-10-CM | POA: Insufficient documentation

## 2022-10-05 DIAGNOSIS — R2681 Unsteadiness on feet: Secondary | ICD-10-CM | POA: Diagnosis not present

## 2022-10-05 DIAGNOSIS — M25552 Pain in left hip: Secondary | ICD-10-CM | POA: Insufficient documentation

## 2022-10-05 DIAGNOSIS — M79604 Pain in right leg: Secondary | ICD-10-CM | POA: Diagnosis not present

## 2022-10-05 DIAGNOSIS — R278 Other lack of coordination: Secondary | ICD-10-CM | POA: Insufficient documentation

## 2022-10-05 DIAGNOSIS — M79605 Pain in left leg: Secondary | ICD-10-CM | POA: Diagnosis not present

## 2022-10-05 DIAGNOSIS — R262 Difficulty in walking, not elsewhere classified: Secondary | ICD-10-CM | POA: Insufficient documentation

## 2022-10-05 NOTE — Therapy (Signed)
OUTPATIENT PHYSICAL THERAPY LSVT BIG TREATMENT   Patient Name: Kendra [name] MRN: [id number] DOB:1946-02-06, 76 y.o., female Today's Date: 10/05/2022  PCP: Virginia Crews, MD REFERRING PROVIDER: Virginia Crews, MD  END OF SESSION:  PT End of Session - 10/05/22 1001     Visit Number 2    Number of Visits 17    Date for PT Re-Evaluation 11/10/22    PT Start Time 1003    PT Stop Time 1100    PT Time Calculation (min) 57 min    Equipment Utilized During Treatment Gait belt    Activity Tolerance Patient tolerated treatment well;Patient limited by pain    Behavior During Therapy WFL for tasks assessed/performed              Past Medical History:  Diagnosis Date   Anxiety    Breast cancer ([place]) 03/2018   right breast cancer at 11:00 and 1:00   Bursitis    bilateral hips and knees   Complication of anesthesia    Depression    Family history of breast cancer 03/24/2018   Family history of lung cancer 03/24/2018   History of alcohol dependence ([place]) 2007   no ETOH; resolved since 2007   History of hiatal hernia    History of psychosis 2014   due to sleep disturbance   Hypertension    Parkinson disease    Personal history of radiation therapy 06/2018-07/2018   right breast ca   PONV (postoperative nausea and vomiting)    Past Surgical History:  Procedure Laterality Date   BREAST BIOPSY Right 03/14/2018   11:00 DCIS and invasive ductal carcinoma   BREAST BIOPSY Right 03/14/2018   1:00 Invasive ductal carcinoma   BREAST LUMPECTOMY Right 04/27/2018   lumpectomy of 11 and 1:00 cancers, clear margins, negative LN   BREAST LUMPECTOMY WITH RADIOACTIVE SEED AND SENTINEL LYMPH NODE BIOPSY Right 04/27/2018   Procedure: RIGHT BREAST LUMPECTOMY WITH RADIOACTIVE SEED X 2 AND RIGHT SENTINEL LYMPH NODE BIOPSY;  Surgeon: [name], MD;  Location: [place];  Service: General;  Laterality: Right;   CATARACT EXTRACTION W/PHACO Left 08/30/2019    Procedure: CATARACT EXTRACTION PHACO AND INTRAOCULAR LENS PLACEMENT ([place]) LEFT panoptix toric  01:20.5  12.7%  10.26;  Surgeon: Leandrew Koyanagi, MD;  Location: [facility] [facility];  Service: Ophthalmology;  Laterality: Left;   CATARACT EXTRACTION W/PHACO Right 09/20/2019   Procedure: CATARACT EXTRACTION PHACO AND INTRAOCULAR LENS PLACEMENT ([facility]) RIGHT 5.71  00:36.4  15.7%;  Surgeon: Leandrew Koyanagi, MD;  Location: [facility];  Service: Ophthalmology;  Laterality: Right;   LAPAROTOMY N/A 02/10/2018   Procedure: EXPLORATORY LAPAROTOMY;  Surgeon: Isabel Caprice, MD;  Location: [facility];  Service: Gynecology;  Laterality: N/A;   MASS EXCISION  01/2018   abdominal   OVARIAN CYST REMOVAL     SALPINGOOPHORECTOMY Bilateral 02/10/2018   Procedure: BILATERAL SALPINGO OOPHORECTOMY; PERITONEAL WASHINGS;  Surgeon: Isabel Caprice, MD;  Location: [facility];  Service: Gynecology;  Laterality: Bilateral;   TONSILLECTOMY     TONSILLECTOMY AND ADENOIDECTOMY  1953   Patient Active Problem List   Diagnosis Date Noted   Blepharitis of upper and lower eyelids of both eyes 08/17/2022   Subacute cough 08/17/2022   History of allergy to shellfish 02/24/2022   Chronic low back pain of over 3 months duration 02/04/2022   Osteoarthritis of  AC (acromioclavicular) joint (Left) 02/04/2022   Osteoarthritis of hip (Right) 02/04/2022   Impaction fracture of distal end of  radius and ulna, sequela (Left) 02/04/2022   Osteoarthritis of 1st (thumb) carpometacarpal [facility]) joint of hand (Left) 02/04/2022   Lumbosacral facet arthropathy (Multilevel) (Bilateral) (T12-S1) 02/04/2022   Lumbar central spinal stenosis w/o neurogenic claudication (L2-3, L3-4, L4-5) 02/04/2022   Lumbar foraminal stenosis (Bilateral: L2-3, L4-5) (Right: (Severe) L4-5) 02/04/2022   Lumbar lateral recess stenosis (Bilateral: L2-3, L3-4, and L4-5) (Severe) 02/04/2022   DDD (degenerative disc disease), lumbar 02/04/2022   Lumbosacral facet  syndrome (Multilevel) (Bilateral) 02/04/2022   Osteoarthritis of lumbar spine 02/04/2022   Erythema ab igne 12/23/2021   Allergic conjunctivitis of both eyes 12/23/2021   Chronic hip pain (3ry area of Pain) (Bilateral) (L>R) 12/22/2021   Chronic lower extremity pain (2ry area of Pain) (Bilateral) (L>R) 12/22/2021   Abnormal MRI, lumbar spine (01/31/2020) 12/22/2021   Grade 1 Anterolisthesis of lumbar spine (L3/L4) & (5 mm) (L4/L5) 12/22/2021   Chronic pain syndrome 11/17/2021   High risk medication use 11/17/2021   Disorder of skeletal system 11/17/2021   Problems influencing health status 11/17/2021   Other hyperlipidemia 10/21/2021   Polypharmacy 08/12/2021   Chronic low back pain (1ry area of Pain) (Bilateral) (L>R) w/o sciatica 06/18/2021   Elevated blood pressure reading 05/23/2021   Osteopenia of neck of femur (Right) 09/06/2020   Obesity 08/29/2020   Parkinson disease 02/22/2020   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   Bipolar disorder, in full remission, most recent episode depressed ([place]) 11/08/2019   Vitamin D insufficiency 08/24/2019   Insomnia due to mental condition 05/31/2019   Bipolar I disorder, most recent episode depressed ([place]) 05/31/2019   Alcohol use disorder, moderate, in sustained remission ([place]) 05/31/2019   Elevated tumor markers 05/27/2019   GAD (generalized anxiety disorder) 04/10/2019   MDD (major depressive disorder), severe ([place]) 01/31/2019   High serum carbohydrate antigen 19-9 (CA19-9) 12/01/2018   Adjustment disorder with anxious mood 11/24/2018   Essential hypertension 11/24/2018   B12 deficiency 10/17/2018   Incisional hernia, without obstruction or gangrene 09/12/2018   Genetic testing 04/07/2018   Family history of breast cancer 03/24/2018   Family history of lung cancer 03/24/2018   Malignant neoplasm of upper-outer quadrant of right breast in female, estrogen receptor positive ([place]) 03/17/2018   Malignant neoplasm of upper-inner quadrant of right  breast in female, estrogen receptor positive ([place]) 03/17/2018   History of psychosis 02/07/2018   History of insomnia 02/07/2018    ONSET DATE: Pt diagnosed with PD in 2019  REFERRING DIAG:  G20 (ICD-10-CM) - Parkinson's disease      THERAPY DIAG:  Difficulty in walking, not elsewhere classified  Muscle weakness (generalized)  Other lack of coordination  Unsteadiness on feet  Pain in left leg  Pain in right leg  Rationale for Evaluation and Treatment: Rehabilitation  SUBJECTIVE:  SUBJECTIVE STATEMENT: Pt reports continued pain in B hips and BLEs, does not provide pain rating. She is feeling tired, just had LSVT LOUD. No other updates provided.  Pt accompanied by: significant other self  PERTINENT HISTORY:  Pt is a pleasant 76 yo female who was diagnosed with PD in 2019. She is known to clinic and [facility], and was last seen for LSVT BIG Sept-Oct 2022. Pt with chronic pain and has become increasingly sedentary as a result exacerbating mobility difficulties due to PD. Current concerns: Pt reports L and R hip pain and back pain. Pt was being seen by pain management clinic. Pt has had injections that provided relief at the time. Pt's spouse reports her physician determined surgery was not an option for her pain so the plan is to manage it. Pt spouse thinks pain has resulted in reluctance for pt to move. Pt currently sedentary, doesn't walk much, but when she does she holds on to furniture for support due to balance problems. When pt first stands she must hold on to something due to mix of pain and/or dizziness (described as lightheadedness). Her pain can go from 3/10 to 9/10. It does improve once she starts moving. Pt has increased difficulty with multiple activities including: getting in and out of her  bed, getting in and out of her car, freezing, decreased gait speed, decreased balance and cognition, and difficulty using BUE due to tremors.  Per chart other PMH includes hx of breast cancer, HTN, MCI, insomnia, cataracts, bipolar 1, alcohol use disorder, GAD, MDD, osteopenia of neck of R femur, chronic B low back pain with radiating pain to buttock and posterior thighs and calves. Pt with multiple comorbidities, please refer to chart for full details    PAIN: from eval Are you having pain? Yes: NPRS scale: not rated currently but can reach 9/10 Pain location: L hip and L leg Pain description: pt has difficulty describing Aggravating factors: unsure Relieving factors: laying in the recliner helps, movement  PRECAUTIONS: Fall  WEIGHT BEARING RESTRICTIONS: No  FALLS: Has patient fallen in last 6 months? Yes. Number of falls 1x, leg tangled in chair leg when getting up from a chair  LIVING ENVIRONMENT: Lives with: lives with their spouse Lives in: House/apartment Stairs:  Has following equipment at home:  pt not currently using AD, holds onto walls/furniture, does have walker and SPC at home  PLOF: Independent with basic ADLs  PATIENT GOALS: improve mobility  IMAGING: via chart  07/24/22 DG BONE density " ASSESSMENT: The probability of a major osteoporotic fracture is 15.8% within the next ten years.   The probability of a hip fracture is 2.9% within the next ten years."  12/22/21 DG lumbar spine " IMPRESSION: 1. No acute displaced fracture or traumatic listhesis of the lumbar spine. 2. Interval development of grade 1 anterolisthesis of L3 on L4. Stable grade 1 anterolisthesis of L4 on L5. Appears stable on flexion extension views. 3.  Aortic Atherosclerosis (ICD10-I70.0)."  Please refer to chart for full details  OBJECTIVE: taken from eval unless otherwise indicated  LSVT BIG EVALUATION & EXAMINATION   Neurological and Other Medical Information:   Pt known to clinic  some answers via chart (2022) What were your initial symptoms of Parkinson's disease? first diagnosed 2019   Do you have tremors?  Yes: R hand   Do you have any pain? Yes: B hip and back, refer to above    Medical Information:    Medication for Parkinson's disease: sinemet  Motor Symptoms:    When did you first start to notice changes in your movement you associate with Parkinson's disease? Recent changes following d/c from previous LSVT and HH - increasingly sedentary due to pain per spouse   What are your current symptoms? Difficulty getting out of a chair, sometimes has difficulty getting in and out of bed, difficult with walking, still dresses herself but is mod I and sometimes must sit to dress, requires assistance with bathing, needs assistance with meal prep (due to balance and difficulty using her hands), has general difficulty with sequencing tasks/activities   What do you do when you want to move the best you possibly can? Pt does not know    Has Parkinson's disease caused you to move less or be less active? Yes: feels most recent contribution is due to pain, freezing  Have you noticed if your movement is slower than it used to be? For example, walking, getting dressed, doing household chores, bathing, etc. Yes: pt spouse reports difficulty maintaining at home exercises following discharge   Have you or others noticed any changes in your posture? Yes: pt notices she doesn't always stand up straight   How many (if any) falls have you had in the last six months? 1x     Have you noticed any freezing with your movement? Yes:     Are there some activities you now need help with because of your Parkinson's disease? See above     Assessment:     MMT: deferred due to time   Donning jacket: 17 seconds Doffing jacket: 7 seconds   Opening jar: 5.7 seconds with multiple attempts   Sit to stand assessment:  5x STS: 27 seconds    Gait assessments:  10 MWT:   0.58 m/s with  rollator     Functional Component movements:   Sit to stand (required) Donning jacket (arm through sleeve) Opening jar Stepping over and on to obstacle (simulate getting in car simulation) Big and tall posture (stand BIG)   Hierarchies Getting in and out of bed 2. Ambulating for distance and over uneven surfaces/obstacles with rollator or LRD    PATIENT SURVEYS:  FOTO 40 (goal 49) - filled out by spouse due to pt impaired cognition   PATIENT EDUCATION:  Education details: Pt educated throughout session about proper posture and technique with exercises. Improved exercise technique, movement at target joints, use of target muscles after min to mod verbal, visual, tactile cues. LSVT BIG HEP Person educated: Patient and Spouse Education method: Explanation Education comprehension: verbalized understanding   TODAY'S TREATMENT:                                                                                                                                         DATE:    LSVT BIG Treatment:  Patient seen for LSVT Daily Session Maximal Daily Exercises for facilitation/coordination of movement Sustained  movements are designed to rescale the amplitude of movement output for generalization to daily functional activities.  Maximal Adapted Daily Exercises: Floor to Ceiling 8 with 10 second hold Side to Side 8 Bilateral with 10 second hold Forward Step and Reach 10 Bilateral Sideways Step and Reach 10 Bilateral Backwards Step and Reach 10 Bilateral Forwards Rock and Reach 10 Bilateral Sideways Rock and Reach 10 Bilateral  Maximal Daily Exercise Comments: needs modeling for correct positions, VC/TC for big reach, increased amplitude, increased effort >85% of time. Rest breaks taken throughout due to fatigue and for pain modulation (chronic BLE pain). Pt's pain closely monitored throughout. Pt exhibits carryover from prior LSVT course and picks up technique quickly.    Functional  Component movements:  5 reps performed of each Sit to stand (required) 2. Donning jacket (arm through sleeve) 3. Opening jar - instructed in use of finger flicks 4. Stepping over and on to obstacle (simulate getting in car simulation) - decreased height of step to improve comfort with intervention  5. Big and tall posture (stand BIG)   Hierarchies Getting in and out of bed - deferred due to time limitations 2. Ambulating for distance and over uneven surfaces/obstacles with rollator or LRD - able to perform 2 x approx 20 ft with CGA, no AD, stepping over obstacles of approx 2" height (3 objects)   Gait - performance and progress: PT provides cuing for big steps, big posture, big arm swing 6-% of time to override hypokinesia/bradykinesia. PT provides CGA throughout. Pt ambulated 2x20 feet on firm surface with 3 obstacles for foot clearance (see above) in moderately busy environment.   Carryover Assignment: finger flick with big reach and grab for doorknob      HOME EXERCISE PROGRAM:  Pt to perform all 7 LSVT BIG  (adapted) maximal daily exercises, 5 Functional Component Tasks and big walking and carryover assignment (see above)  GOALS: Goals reviewed with patient? Yes  SHORT TERM GOALS: Target date: 10/27/2022    Patient will be independent in home exercise program to improve strength/mobility for better functional independence with ADLs. Baseline: LSVT BIG protocol to be initiated next session Goal status: INITIAL   LONG TERM GOALS: Target date: 11/11/2022   Patient will increase FOTO score to equal to or greater than 49 to demonstrate statistically significant improvement in mobility and quality of life.  Baseline: 40 Goal status: INITIAL  2.  Patient (> 55 years old) will complete five times sit to stand test in < 15 seconds indicating decreased fall risk.  Baseline: 27 sec Goal status: INITIAL  3.  Patient will increase 10 meter walk test to >1.7ms with WFL B  step-length, upright posture for >90% of test to improve ease with gait and to override hypokentic and bradykinetic movement.  Baseline: 0.58 m/s with 4WW Goal status: INITIAL  4.  Patient will reduce time to don jacket by at least 5 seconds in order to increase ease with dressing and override bradykinetic movement Baseline: 17 seconds Goal status: INITIAL  5.  Pt will demo or report ability to open standard jar using only 1-2 attempts in order to exhibit improved functional mobility for ADLs.  Baseline: 5.7 seconds with multiple attempts to turn jar lid to open Goal status: INITIAL  ASSESSMENT:  CLINICAL IMPRESSION: LSVT BIG interventions and HEP initiated on this date. Pt exhibits good carryover from previous bout of LSVT BIG back in 2022, requiring around 85% cuing for adapted maximal daily exercises. Pt largely limited by fatigue,  requiring and requesting frequent rest breaks throughout session. She also struggle with finger abduction of B hands and with step-length necessary for adapted maximal daily exercises. The pt will benefit from further LSVT BIG PT in order to address these deficits to improve overall functional mobility, QOL and to decrease fall risk.  OBJECTIVE IMPAIRMENTS: Abnormal gait, decreased balance, decreased cognition, decreased coordination, decreased endurance, decreased knowledge of use of DME, decreased mobility, difficulty walking, decreased strength, dizziness, hypomobility, impaired UE functional use, improper body mechanics, postural dysfunction, and pain.   ACTIVITY LIMITATIONS: lifting, bending, standing, squatting, stairs, transfers, bed mobility, bathing, dressing, hygiene/grooming, and locomotion level  PARTICIPATION LIMITATIONS: meal prep, cleaning, laundry, medication management, personal finances, driving, shopping, community activity, and yard work  PERSONAL FACTORS: Age, Fitness, Sex, Time since onset of injury/illness/exacerbation, and 3+  comorbidities: PMH includes hx of breast cancer, HTN, MCI, insomnia, cataracts, bipolar 1, alcohol use disorder, GAD, MDD, osteopenia of neck of R femur, chronic B low back pain with radiating pain to buttock and posterior thighs and calves. Pt with multiple comorbidities, please refer to chart for full details  are also affecting patient's functional outcome.   REHAB POTENTIAL: Fair    CLINICAL DECISION MAKING: Evolving/moderate complexity  EVALUATION COMPLEXITY: High  PLAN:  PT FREQUENCY: 4x/week  PT DURATION: 6 weeks  PLANNED INTERVENTIONS: Therapeutic exercises, Therapeutic activity, Neuromuscular re-education, Balance training, Gait training, Patient/Family education, Self Care, Joint mobilization, Stair training, Vestibular training, Canalith repositioning, Orthotic/Fit training, DME instructions, Wheelchair mobility training, Spinal mobilization, Cryotherapy, Moist heat, Splintting, Taping, Manual therapy, and Re-evaluation  PLAN FOR NEXT SESSION: adapted maximal daily exercises, functional component tasks, hierarchy tasks, big walking   Zollie Pee, PT 10/05/2022, 2:58 PM

## 2022-10-05 NOTE — Therapy (Deleted)
OUTPATIENT PHYSICAL THERAPY [facility]T BIG TREATMENT   Patient Name: Kendra [name] MRN: [id number] DOB:Jan 14, 1946, 76 y.o., female Today's Date: 10/05/2022  PCP: Virginia Crews, MD REFERRING PROVIDER: Virginia Crews, MD  END OF SESSION:    Past Medical History:  Diagnosis Date   Anxiety    Breast cancer ([place]) 03/2018   right breast cancer at 11:00 and 1:00   Bursitis    bilateral hips and knees   Complication of anesthesia    Depression    Family history of breast cancer 03/24/2018   Family history of lung cancer 03/24/2018   History of alcohol dependence ([place]) 2007   no ETOH; resolved since 2007   History of hiatal hernia    History of psychosis 2014   due to sleep disturbance   Hypertension    Parkinson disease    Personal history of radiation therapy 06/2018-07/2018   right breast ca   PONV (postoperative nausea and vomiting)    Past Surgical History:  Procedure Laterality Date   BREAST BIOPSY Right 03/14/2018   11:00 DCIS and invasive ductal carcinoma   BREAST BIOPSY Right 03/14/2018   1:00 Invasive ductal carcinoma   BREAST LUMPECTOMY Right 04/27/2018   lumpectomy of 11 and 1:00 cancers, clear margins, negative LN   BREAST LUMPECTOMY WITH RADIOACTIVE SEED AND SENTINEL LYMPH NODE BIOPSY Right 04/27/2018   Procedure: RIGHT BREAST LUMPECTOMY WITH RADIOACTIVE SEED X 2 AND RIGHT SENTINEL LYMPH NODE BIOPSY;  Surgeon: [name], MD;  Location: [facility];  Service: General;  Laterality: Right;   CATARACT EXTRACTION W/PHACO Left 08/30/2019   Procedure: CATARACT EXTRACTION PHACO AND INTRAOCULAR LENS PLACEMENT ([place]) LEFT panoptix toric  01:20.5  12.7%  10.26;  Surgeon: Leandrew Koyanagi, MD;  Location: [facility];  Service: Ophthalmology;  Laterality: Left;   CATARACT EXTRACTION W/PHACO Right 09/20/2019   Procedure: CATARACT EXTRACTION PHACO AND INTRAOCULAR LENS PLACEMENT ([facility]) RIGHT 5.71  00:36.4  15.7%;  Surgeon: Leandrew Koyanagi, MD;  Location: [facility];  Service: Ophthalmology;  Laterality: Right;   LAPAROTOMY N/A 02/10/2018   Procedure: EXPLORATORY LAPAROTOMY;  Surgeon: Isabel Caprice, MD;  Location: [facility];  Service: Gynecology;  Laterality: N/A;   MASS EXCISION  01/2018   abdominal   OVARIAN CYST REMOVAL     SALPINGOOPHORECTOMY Bilateral 02/10/2018   Procedure: BILATERAL SALPINGO OOPHORECTOMY; PERITONEAL WASHINGS;  Surgeon: Isabel Caprice, MD;  Location: [facility];  Service: Gynecology;  Laterality: Bilateral;   TONSILLECTOMY     TONSILLECTOMY AND ADENOIDECTOMY  1953   Patient Active Problem List   Diagnosis Date Noted   Blepharitis of upper and lower eyelids of both eyes 08/17/2022   Subacute cough 08/17/2022   History of allergy to shellfish 02/24/2022   Chronic low back pain of over 3 months duration 02/04/2022   Osteoarthritis of  AC (acromioclavicular) joint (Left) 02/04/2022   Osteoarthritis of hip (Right) 02/04/2022   Impaction fracture of distal end of radius and ulna, sequela (Left) 02/04/2022   Osteoarthritis of 1st (thumb) carpometacarpal (CMC) joint of hand (Left) 02/04/2022   Lumbosacral facet arthropathy (Multilevel) (Bilateral) (T12-S1) 02/04/2022   Lumbar central spinal stenosis w/o neurogenic claudication (L2-3, L3-4, L4-5) 02/04/2022   Lumbar foraminal stenosis (Bilateral: L2-3, L4-5) (Right: (Severe) L4-5) 02/04/2022   Lumbar lateral recess stenosis (Bilateral: L2-3, L3-4, and L4-5) (Severe) 02/04/2022   DDD (degenerative disc disease), lumbar 02/04/2022   Lumbosacral facet syndrome (Multilevel) (Bilateral) 02/04/2022   Osteoarthritis of lumbar spine 02/04/2022   Erythema ab igne  12/23/2021   Allergic conjunctivitis of both eyes 12/23/2021   Chronic hip pain (3ry area of Pain) (Bilateral) (L>R) 12/22/2021   Chronic lower extremity pain (2ry area of Pain) (Bilateral) (L>R) 12/22/2021   Abnormal MRI, lumbar spine (01/31/2020) 12/22/2021   Grade 1 Anterolisthesis of  lumbar spine (L3/L4) & (5 mm) (L4/L5) 12/22/2021   Chronic pain syndrome 11/17/2021   High risk medication use 11/17/2021   Disorder of skeletal system 11/17/2021   Problems influencing health status 11/17/2021   Other hyperlipidemia 10/21/2021   Polypharmacy 08/12/2021   Chronic low back pain (1ry area of Pain) (Bilateral) (L>R) w/o sciatica 06/18/2021   Elevated blood pressure reading 05/23/2021   Osteopenia of neck of femur (Right) 09/06/2020   Obesity 08/29/2020   Parkinson disease 02/22/2020   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   Bipolar disorder, in full remission, most recent episode depressed ([place]) 11/08/2019   Vitamin D insufficiency 08/24/2019   Insomnia due to mental condition 05/31/2019   Bipolar I disorder, most recent episode depressed ([place]) 05/31/2019   Alcohol use disorder, moderate, in sustained remission ([place]) 05/31/2019   Elevated tumor markers 05/27/2019   GAD (generalized anxiety disorder) 04/10/2019   MDD (major depressive disorder), severe ([place]) 01/31/2019   High serum carbohydrate antigen 19-9 (CA19-9) 12/01/2018   Adjustment disorder with anxious mood 11/24/2018   Essential hypertension 11/24/2018   B12 deficiency 10/17/2018   Incisional hernia, without obstruction or gangrene 09/12/2018   Genetic testing 04/07/2018   Family history of breast cancer 03/24/2018   Family history of lung cancer 03/24/2018   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  Rationale for Evaluation and Treatment: Rehabilitation  SUBJECTIVE:                                                                                                                                                                                              SUBJECTIVE STATEMENT: Pt known to PT and returns for LSVT BIG treatment (last seen by [facility] Sept-Oct 2022 for [facility] BIG). Pt spouse provides majority of history as pt has difficulty providing answers with questions/memory difficulty. Pt had HH PT since discharge from previous LSVT (last appt was 6 months ago, however). Pt spouse reports pt's chronic pain has  led her to be increasingly sedentary and her general mobility has decreased. Pt was being followed by pain clinic, spouse reports she does not currently have another scheduled appointment.  Pt accompanied by: significant other husband [name]  PERTINENT HISTORY:  Pt is a pleasant 76 yo female who was diagnosed with PD in 2019. She is known to clinic and [facility], and was last seen for L[facility]T BIG Sept-Oct 2022. Pt with chronic pain and has become increasingly sedentary as a result exacerbating mobility difficulties due to PD. Current concerns: Pt reports L and R hip pain and back pain. Pt was being seen by pain management clinic. Pt has had injections that provided relief at the time. Pt's spouse reports her physician determined surgery was not an option for her pain so the plan is to manage it. Pt spouse thinks pain has resulted in reluctance for pt to move. Pt currently sedentary, doesn't walk much, but when she does she holds on to furniture for support due to balance problems. When pt first stands she must hold on to something due to mix of pain and/or dizziness (described as lightheadedness). Her pain can go from 3/10 to 9/10. It does improve once she starts moving. Pt has increased difficulty with multiple activities including: getting in and out of her bed, getting in and out of her car, freezing, decreased gait speed, decreased balance and cognition, and difficulty using BUE due to tremors.  Per chart other PMH  includes hx of breast cancer, HTN, MCI, insomnia, cataracts, bipolar 1, alcohol use disorder, GAD, MDD, osteopenia of neck of R femur, chronic B low back pain with radiating pain to buttock and posterior thighs and calves. Pt with multiple comorbidities, please refer to chart for full details    PAIN:  Are you having pain? Yes: NPRS scale: not rated currently but can reach 9/10 Pain location: L hip and L leg Pain description: pt has difficulty describing Aggravating factors: unsure Relieving factors: laying in the recliner helps, movement  PRECAUTIONS: Fall  WEIGHT BEARING RESTRICTIONS: No  FALLS: Has patient fallen in last 6 months? Yes. Number of falls 1x, leg tangled in chair leg when getting up from a chair  LIVING ENVIRONMENT: Lives with: lives with their spouse Lives in: House/apartment Stairs:  Has following equipment at home:  pt not currently using AD, holds onto walls/furniture, does have walker and SPC at home  PLOF: Independent with basic ADLs  PATIENT GOALS: improve mobility  IMAGING: via chart  07/24/22 DG BONE density " ASSESSMENT: The probability of a major osteoporotic fracture is 15.8% within the next ten years.   The probability of a hip fracture is 2.9% within the next ten years."  12/22/21 DG lumbar spine " IMPRESSION: 1. No acute displaced fracture or traumatic listhesis of the lumbar spine. 2. Interval development of grade 1 anterolisthesis of L3 on L4. Stable grade 1 anterolisthesis of L4 on L5. Appears stable on flexion extension views. 3.  Aortic Atherosclerosis (ICD10-I70.0)."  Please refer to chart for full details  OBJECTIVE:   LSVT BIG EVALUATION & EXAMINATION   Neurological and Other Medical Information:   Pt known to clinic some answers via chart (2022) What were your initial symptoms of Parkinson's disease? first diagnosed 2019   Do you have tremors?  Yes: R hand   Do you have any pain? Yes: B hip and back, refer to above     Medical Information:    Medication for Parkinson's disease: sinemet   Motor Symptoms:  When did you first start to notice changes in your movement you associate with Parkinson's disease? Recent changes following d/c from previous LSVT and HH - increasingly sedentary due to pain per spouse   What are your current symptoms? Difficulty getting out of a chair, sometimes has difficulty getting in and out of bed, difficult with walking, still dresses herself but is mod I and sometimes must sit to dress, requires assistance with bathing, needs assistance with meal prep (due to balance and difficulty using her hands), has general difficulty with sequencing tasks/activities   What do you do when you want to move the best you possibly can? Pt does not know    Has Parkinson's disease caused you to move less or be less active? Yes: feels most recent contribution is due to pain, freezing  Have you noticed if your movement is slower than it used to be? For example, walking, getting dressed, doing household chores, bathing, etc. Yes: pt spouse reports difficulty maintaining at home exercises following discharge   Have you or others noticed any changes in your posture? Yes: pt notices she doesn't always stand up straight   How many (if any) falls have you had in the last six months? 1x     Have you noticed any freezing with your movement? Yes:     Are there some activities you now need help with because of your Parkinson's disease? See above     Assessment:     MMT: deferred due to time   Donning jacket: 17 seconds Doffing jacket: 7 seconds   Opening jar: 5.7 seconds with multiple attempts   Sit to stand assessment:  5x STS: 27 seconds    Gait assessments:  10 MWT:   0.58 m/s with rollator     Functional Component movements:   Sit to stand (required) Donning jacket (arm through sleeve)  Opening jar Stepping over and on to obstacle (simulate getting in car simulation) Big and tall  posture (stand BIG)   Hierarchies Getting in and out of bed 2. Ambulating for distance and over uneven surfaces/obstacles with rollator or LRD    PATIENT SURVEYS:  FOTO 40 (goal 49) - filled out by spouse due to pt impaired cognition   PATIENT EDUCATION:  Education details: exam findings, indications, plan Person educated: Patient and Spouse Education method: Explanation Education comprehension: verbalized understanding   TODAY'S TREATMENT:                                                                                                                                         DATE: eval only  HOME EXERCISE PROGRAM:  LSVT BIG protocol to be initiated next session  GOALS: Goals reviewed with patient? Yes  SHORT TERM GOALS: Target date: 10/27/2022    Patient will be independent in home exercise program to improve strength/mobility for better functional independence with ADLs. Baseline: LSVT BIG protocol to  be initiated next session Goal status: INITIAL   LONG TERM GOALS: Target date: 11/11/2022   Patient will increase FOTO score to equal to or greater than 49 to demonstrate statistically significant improvement in mobility and quality of life.  Baseline: 40 Goal status: INITIAL  2.  Patient (> 28 years old) will complete five times sit to stand test in < 15 seconds indicating decreased fall risk.  Baseline: 27 sec Goal status: INITIAL  3.  Patient will increase 10 meter walk test to >1.4ms with WFL B step-length, upright posture for >90% of test to improve ease with gait and to override hypokentic and bradykinetic movement.  Baseline: 0.58 m/s with 4WW Goal status: INITIAL  4.  Patient will reduce time to don jacket by at least 5 seconds in order to increase ease with dressing and override bradykinetic movement Baseline: 17 seconds Goal status: INITIAL  5.  Pt will demo or report ability to open standard jar using only 1-2 attempts in order to exhibit improved  functional mobility for ADLs.  Baseline: 5.7 seconds with multiple attempts to turn jar lid to open Goal status: INITIAL  ASSESSMENT:  CLINICAL IMPRESSION: Patient is a pleasant 76y.o. female who was seen today for LSVT BIG physical therapy evaluation due to decreased mobility due to PD and chronic pain.  Exam findings indicate pt with bradykinetic and hypokinetic movement, impaired posture and cognition, decreased gait speed and abnormal gait mechanics, as well as decreased strength and increased fall risk AEB performance on 5xSTS and 10MWT. Pt reports and demonstrates general difficulty with ADLs, including dressing, bed mobility, UE tasks, and overall functional mobility. Chronic hip and back pain are contributing factors in addition to PD to decline in mobility since last seen in clinic. The pt will benefit from LSVT BIG PT in order to address these deficits to improve overall functional mobility, QOL and to decrease fall risk.  OBJECTIVE IMPAIRMENTS: Abnormal gait, decreased balance, decreased cognition, decreased coordination, decreased endurance, decreased knowledge of use of DME, decreased mobility, difficulty walking, decreased strength, dizziness, hypomobility, impaired UE functional use, improper body mechanics, postural dysfunction, and pain.   ACTIVITY LIMITATIONS: lifting, bending, standing, squatting, stairs, transfers, bed mobility, bathing, dressing, hygiene/grooming, and locomotion level  PARTICIPATION LIMITATIONS: meal prep, cleaning, laundry, medication management, personal finances, driving, shopping, community activity, and yard work  PERSONAL FACTORS: Age, Fitness, Sex, Time since onset of injury/illness/exacerbation, and 3+ comorbidities: PMH includes hx of breast cancer, HTN, MCI, insomnia, cataracts, bipolar 1, alcohol use disorder, GAD, MDD, osteopenia of neck of R femur, chronic B low back pain with radiating pain to buttock and posterior thighs and calves. Pt with  multiple comorbidities, please refer to chart for full details  are also affecting patient's functional outcome.   REHAB POTENTIAL: Fair    CLINICAL DECISION MAKING: Evolving/moderate complexity  EVALUATION COMPLEXITY: High  PLAN:  PT FREQUENCY: 4x/week  PT DURATION: 6 weeks  PLANNED INTERVENTIONS: Therapeutic exercises, Therapeutic activity, Neuromuscular re-education, Balance training, Gait training, Patient/Family education, Self Care, Joint mobilization, Stair training, Vestibular training, Canalith repositioning, Orthotic/Fit training, DME instructions, Wheelchair mobility training, Spinal mobilization, Cryotherapy, Moist heat, Splintting, Taping, Manual therapy, and Re-evaluation  PLAN FOR NEXT SESSION: initiate [facility] 10/05/2022, 9:36 AM

## 2022-10-05 NOTE — Therapy (Signed)
OUTPATIENT SPEECH LANGUAGE PATHOLOGY LSVT LOUD TREATMENT   Patient Name: Kendra [name] MRN: [id number] DOB:01-11-1946, 76 y.o., female Today's Date: 10/05/2022  PCP: Lavon Paganini, MD REFERRING PROVIDER: Tally Joe, FNP   End of Session - 10/05/22 0929     Visit Number 2    Number of Visits 17    Date for SLP Re-Evaluation 11/28/22    SLP Start Time 0900    SLP Stop Time  1000    SLP Time Calculation (min) 60 min    Activity Tolerance Patient tolerated treatment well             Past Medical History:  Diagnosis Date   Anxiety    Breast cancer ([place]) 03/2018   right breast cancer at 11:00 and 1:00   Bursitis    bilateral hips and knees   Complication of anesthesia    Depression    Family history of breast cancer 03/24/2018   Family history of lung cancer 03/24/2018   History of alcohol dependence ([place]) 2007   no ETOH; resolved since 2007   History of hiatal hernia    History of psychosis 2014   due to sleep disturbance   Hypertension    Parkinson disease    Personal history of radiation therapy 06/2018-07/2018   right breast ca   PONV (postoperative nausea and vomiting)    Past Surgical History:  Procedure Laterality Date   BREAST BIOPSY Right 03/14/2018   11:00 DCIS and invasive ductal carcinoma   BREAST BIOPSY Right 03/14/2018   1:00 Invasive ductal carcinoma   BREAST LUMPECTOMY Right 04/27/2018   lumpectomy of 11 and 1:00 cancers, clear margins, negative LN   BREAST LUMPECTOMY WITH RADIOACTIVE SEED AND SENTINEL LYMPH NODE BIOPSY Right 04/27/2018   Procedure: RIGHT BREAST LUMPECTOMY WITH RADIOACTIVE SEED X 2 AND RIGHT SENTINEL LYMPH NODE BIOPSY;  Surgeon: [name], MD;  Location: [place];  Service: General;  Laterality: Right;   CATARACT EXTRACTION W/PHACO Left 08/30/2019   Procedure: CATARACT EXTRACTION PHACO AND INTRAOCULAR LENS PLACEMENT ([place]) LEFT panoptix toric  01:20.5  12.7%  10.26;  Surgeon: Leandrew Koyanagi, MD;   Location: [place];  Service: Ophthalmology;  Laterality: Left;   CATARACT EXTRACTION W/PHACO Right 09/20/2019   Procedure: CATARACT EXTRACTION PHACO AND INTRAOCULAR LENS PLACEMENT ([facility]) RIGHT 5.71  00:36.4  15.7%;  Surgeon: Leandrew Koyanagi, MD;  Location: [place];  Service: Ophthalmology;  Laterality: Right;   LAPAROTOMY N/A 02/10/2018   Procedure: EXPLORATORY LAPAROTOMY;  Surgeon: Isabel Caprice, MD;  Location: [facility];  Service: Gynecology;  Laterality: N/A;   MASS EXCISION  01/2018   abdominal   OVARIAN CYST REMOVAL     SALPINGOOPHORECTOMY Bilateral 02/10/2018   Procedure: BILATERAL SALPINGO OOPHORECTOMY; PERITONEAL WASHINGS;  Surgeon: Isabel Caprice, MD;  Location: [facility];  Service: Gynecology;  Laterality: Bilateral;   TONSILLECTOMY     TONSILLECTOMY AND ADENOIDECTOMY  1953   Patient Active Problem List   Diagnosis Date Noted   Blepharitis of upper and lower eyelids of both eyes 08/17/2022   Subacute cough 08/17/2022   History of allergy to shellfish 02/24/2022   Chronic low back pain of over 3 months duration 02/04/2022   Osteoarthritis of  AC (acromioclavicular) joint (Left) 02/04/2022   Osteoarthritis of hip (Right) 02/04/2022   Impaction fracture of distal end of radius and ulna, sequela (Left) 02/04/2022   Osteoarthritis of 1st (thumb) carpometacarpal (CMC) joint of hand (Left) 02/04/2022   Lumbosacral facet arthropathy (Multilevel) (Bilateral) (T12-S1)  02/04/2022   Lumbar central spinal stenosis w/o neurogenic claudication (L2-3, L3-4, L4-5) 02/04/2022   Lumbar foraminal stenosis (Bilateral: L2-3, L4-5) (Right: (Severe) L4-5) 02/04/2022   Lumbar lateral recess stenosis (Bilateral: L2-3, L3-4, and L4-5) (Severe) 02/04/2022   DDD (degenerative disc disease), lumbar 02/04/2022   Lumbosacral facet syndrome (Multilevel) (Bilateral) 02/04/2022   Osteoarthritis of lumbar spine 02/04/2022   Erythema ab igne 12/23/2021   Allergic conjunctivitis of both  eyes 12/23/2021   Chronic hip pain (3ry area of Pain) (Bilateral) (L>R) 12/22/2021   Chronic lower extremity pain (2ry area of Pain) (Bilateral) (L>R) 12/22/2021   Abnormal MRI, lumbar spine (01/31/2020) 12/22/2021   Grade 1 Anterolisthesis of lumbar spine (L3/L4) & (5 mm) (L4/L5) 12/22/2021   Chronic pain syndrome 11/17/2021   High risk medication use 11/17/2021   Disorder of skeletal system 11/17/2021   Problems influencing health status 11/17/2021   Other hyperlipidemia 10/21/2021   Polypharmacy 08/12/2021   Chronic low back pain (1ry area of Pain) (Bilateral) (L>R) w/o sciatica 06/18/2021   Elevated blood pressure reading 05/23/2021   Osteopenia of neck of femur (Right) 09/06/2020   Obesity 08/29/2020   Parkinson disease 02/22/2020   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   Bipolar disorder, in full remission, most recent episode depressed ([place]) 11/08/2019   Vitamin D insufficiency 08/24/2019   Insomnia due to mental condition 05/31/2019   Bipolar I disorder, most recent episode depressed ([place]) 05/31/2019   Alcohol use disorder, moderate, in sustained remission ([place]) 05/31/2019   Elevated tumor markers 05/27/2019   GAD (generalized anxiety disorder) 04/10/2019   MDD (major depressive disorder), severe ([place]) 01/31/2019   High serum carbohydrate antigen 19-9 (CA19-9) 12/01/2018   Adjustment disorder with anxious mood 11/24/2018   Essential hypertension 11/24/2018   B12 deficiency 10/17/2018   Incisional hernia, without obstruction or gangrene 09/12/2018   Genetic testing 04/07/2018   Family history of breast cancer 03/24/2018   Family history of lung cancer 03/24/2018   Malignant neoplasm of upper-outer quadrant of right breast in female, estrogen receptor positive ([place]) 03/17/2018   Malignant neoplasm of upper-inner quadrant of right breast in female, estrogen receptor positive ([place]) 03/17/2018   History of psychosis 02/07/2018   History of insomnia 02/07/2018    Onset date: PD dx  2021  REFERRING DIAG: Parkinson's disease  THERAPY DIAG:  Dysarthria and anarthria  Rationale for Evaluation and Treatment Rehabilitation  SUBJECTIVE:   SUBJECTIVE STATEMENT: "Are you going to show me what to do?"  Pt accompanied by:  self  PERTINENT HISTORY: Patient is a 76 y.o. female with history of Parkinson's disease (dx 2021), anxiety, depression, psychosis, HTN, bipolar disorder, alcoholism, memory issues since 2005, hx head trauma. Referred for LSVT BIG and LOUD; known to our clinic from prior course of therapy completed in 2022.  DIAGNOSTIC FINDINGS: MRI Brain 08/08/2018: "1. No acute intracranial abnormality and largely unremarkable for age gray and white matter signal. 2. NeuroQuant volumetric analysis of the brain suggests age advanced brain volume loss, see details on [facility] PACS."  PAIN:  Are you having pain? Yes: NPRS scale: 7/10 Pain location: left hip and left leg   PATIENT GOALS communicate better  OBJECTIVE:   TODAY'S TREATMENT: LSVT Daily tasks with usual moderate cues. Loud /a/ averaged 81 dB, duration 11.3 seconds, pitch avg 220 Hz.  Max F0 for pitch glides was 315 Hz (83 dB avg), min F0 for low glides was 172 Hz (82 dB avg). Functional phrases averaged 72 dB with usual mod cues. Hierarchical loudness drills at  word/phrase level averaged 72 dB, with frequent mod cues for effort, loudness. Pt had fatigue in last 5 minutes of session, when avg loudness dipped to 68 dB despite cuing.     PATIENT EDUCATION: Education details: course of therapy, therapy goals, decrease in loudness measurements since d/c from therapy in 2022 Person educated: Patient and Spouse Education method: Explanation and Verbal cues Education comprehension: verbalized understanding   HOME EXERCISE PROGRAM: To be provided next session     GOALS: Goals reviewed with patient? Yes  SHORT TERM GOALS: Target date: 10 sessions  The patient will complete Daily Tasks (Maximum duration  "ah", High/Lows, and Functional Phrases) at average loudness >/= 85 dB and with loud, good quality voice with min cues.  Baseline: Goal status: INITIAL  2.  The patient will complete Hierarchal Speech Loudness reading drills (words/phrases, sentences) at average >/= 75 dB and with loud, good quality voice with min cues. Baseline:  Goal status: INITIAL  3.  The patient will participate in 5-8 minutes conversation, maintaining average loudness of 75 dB and loud, good quality voice with min cues. Baseline:  Goal status: INITIAL   LONG TERM GOALS: Target date:   The patient will complete Daily Tasks (Maximum duration "ah", High/Lows, and Functional Phrases) at average loudness >/= 85 dB and with loud, good quality voice Baseline:  Goal status: INITIAL  2.  The patient will complete Hierarchal Speech Loudness reading drills (words/phrases, sentences, and paragraph) at average >/= 75 dB and with loud, good quality voice. Baseline:  Goal status: INITIAL  3.  The patient will participate in 15-20 minutes conversation, maintaining average loudness of >/= 75 dB and loud, good quality voice. Baseline:  Goal status: INITIAL  4.  Patient will report improved communication effectiveness as measured by Communicative Effectiveness Survey.  Baseline: 18/32 on 09/29/22 Goal status: INITIAL   ASSESSMENT:  CLINICAL IMPRESSION: Patient is a 76 y.o. female who presents with mild-moderate dysarthria characterized by reduced vocal intensity, reduced pitch variability, hoarse vocal quality, and subjectively low pitch for gender. Patient and spouse report noting changes in her vocal quality and ability to project her voice. She was stimulable for improvements in intensity, vocal quality, and pitch range with clinician model and cues for "Loud like me." I recommend course of skilled ST for LSVT-LOUD to improve vocal quality and intensity for communicating with family and friends.   OBJECTIVE IMPAIRMENTS  include dysarthria and voice disorder. These impairments are limiting patient from effectively communicating at home and in community. Factors affecting potential to achieve goals and functional outcome are ability to learn/carryover information and previous level of function.. Patient will benefit from skilled SLP services to address above impairments and improve overall function.  REHAB POTENTIAL: Good  PLAN: SLP FREQUENCY: 4x/week  SLP DURATION: 4 weeks  PLANNED INTERVENTIONS: Cueing hierachy, Functional tasks, SLP instruction and feedback, Compensatory strategies, Patient/family education, and LSVT-LOUD      Deneise Lever, MS, Actor [phone]

## 2022-10-06 ENCOUNTER — Ambulatory Visit: Payer: Medicare PPO | Admitting: Speech Pathology

## 2022-10-06 ENCOUNTER — Ambulatory Visit: Payer: Medicare PPO

## 2022-10-06 VITALS — BP 149/59 | HR 80 | Temp 99.3°F | Resp 16

## 2022-10-06 DIAGNOSIS — M79605 Pain in left leg: Secondary | ICD-10-CM | POA: Diagnosis not present

## 2022-10-06 DIAGNOSIS — R471 Dysarthria and anarthria: Secondary | ICD-10-CM | POA: Diagnosis not present

## 2022-10-06 DIAGNOSIS — R262 Difficulty in walking, not elsewhere classified: Secondary | ICD-10-CM

## 2022-10-06 DIAGNOSIS — M6281 Muscle weakness (generalized): Secondary | ICD-10-CM

## 2022-10-06 DIAGNOSIS — R278 Other lack of coordination: Secondary | ICD-10-CM

## 2022-10-06 DIAGNOSIS — R41841 Cognitive communication deficit: Secondary | ICD-10-CM | POA: Diagnosis not present

## 2022-10-06 DIAGNOSIS — R2681 Unsteadiness on feet: Secondary | ICD-10-CM

## 2022-10-06 DIAGNOSIS — M79604 Pain in right leg: Secondary | ICD-10-CM | POA: Diagnosis not present

## 2022-10-06 DIAGNOSIS — M25552 Pain in left hip: Secondary | ICD-10-CM | POA: Diagnosis not present

## 2022-10-06 NOTE — Therapy (Signed)
OUTPATIENT PHYSICAL THERAPY LSVT BIG TREATMENT   Patient Name: Kendra [name] MRN: [id number] DOB:23-Jun-1946, 76 y.o., female Today's Date: 10/06/2022  PCP: Virginia Crews, MD REFERRING PROVIDER: Virginia Crews, MD  END OF SESSION:  PT End of Session - 10/06/22 1329     Visit Number 3    Number of Visits 17    Date for PT Re-Evaluation 11/10/22    PT Start Time 1001    PT Stop Time 1058    PT Time Calculation (min) 57 min    Equipment Utilized During Treatment Gait belt    Activity Tolerance Patient tolerated treatment well    Behavior During Therapy WFL for tasks assessed/performed              Past Medical History:  Diagnosis Date   Anxiety    Breast cancer ([place]) 03/2018   right breast cancer at 11:00 and 1:00   Bursitis    bilateral hips and knees   Complication of anesthesia    Depression    Family history of breast cancer 03/24/2018   Family history of lung cancer 03/24/2018   History of alcohol dependence ([place]) 2007   no ETOH; resolved since 2007   History of hiatal hernia    History of psychosis 2014   due to sleep disturbance   Hypertension    Parkinson disease    Personal history of radiation therapy 06/2018-07/2018   right breast ca   PONV (postoperative nausea and vomiting)    Past Surgical History:  Procedure Laterality Date   BREAST BIOPSY Right 03/14/2018   11:00 DCIS and invasive ductal carcinoma   BREAST BIOPSY Right 03/14/2018   1:00 Invasive ductal carcinoma   BREAST LUMPECTOMY Right 04/27/2018   lumpectomy of 11 and 1:00 cancers, clear margins, negative LN   BREAST LUMPECTOMY WITH RADIOACTIVE SEED AND SENTINEL LYMPH NODE BIOPSY Right 04/27/2018   Procedure: RIGHT BREAST LUMPECTOMY WITH RADIOACTIVE SEED X 2 AND RIGHT SENTINEL LYMPH NODE BIOPSY;  Surgeon: [name], MD;  Location: [place];  Service: General;  Laterality: Right;   CATARACT EXTRACTION W/PHACO Left 08/30/2019   Procedure: CATARACT  EXTRACTION PHACO AND INTRAOCULAR LENS PLACEMENT ([place]) LEFT panoptix toric  01:20.5  12.7%  10.26;  Surgeon: Leandrew Koyanagi, MD;  Location: [place];  Service: Ophthalmology;  Laterality: Left;   CATARACT EXTRACTION W/PHACO Right 09/20/2019   Procedure: CATARACT EXTRACTION PHACO AND INTRAOCULAR LENS PLACEMENT ([facility]) RIGHT 5.71  00:36.4  15.7%;  Surgeon: Leandrew Koyanagi, MD;  Location: [place];  Service: Ophthalmology;  Laterality: Right;   LAPAROTOMY N/A 02/10/2018   Procedure: EXPLORATORY LAPAROTOMY;  Surgeon: Isabel Caprice, MD;  Location: [facility];  Service: Gynecology;  Laterality: N/A;   MASS EXCISION  01/2018   abdominal   OVARIAN CYST REMOVAL     SALPINGOOPHORECTOMY Bilateral 02/10/2018   Procedure: BILATERAL SALPINGO OOPHORECTOMY; PERITONEAL WASHINGS;  Surgeon: Isabel Caprice, MD;  Location: [facility];  Service: Gynecology;  Laterality: Bilateral;   TONSILLECTOMY     TONSILLECTOMY AND ADENOIDECTOMY  1953   Patient Active Problem List   Diagnosis Date Noted   Blepharitis of upper and lower eyelids of both eyes 08/17/2022   Subacute cough 08/17/2022   History of allergy to shellfish 02/24/2022   Chronic low back pain of over 3 months duration 02/04/2022   Osteoarthritis of  AC (acromioclavicular) joint (Left) 02/04/2022   Osteoarthritis of hip (Right) 02/04/2022   Impaction fracture of distal end of radius and ulna,  sequela (Left) 02/04/2022   Osteoarthritis of 1st (thumb) carpometacarpal [employer]) joint of hand (Left) 02/04/2022   Lumbosacral facet arthropathy (Multilevel) (Bilateral) (T12-S1) 02/04/2022   Lumbar central spinal stenosis w/o neurogenic claudication (L2-3, L3-4, L4-5) 02/04/2022   Lumbar foraminal stenosis (Bilateral: L2-3, L4-5) (Right: (Severe) L4-5) 02/04/2022   Lumbar lateral recess stenosis (Bilateral: L2-3, L3-4, and L4-5) (Severe) 02/04/2022   DDD (degenerative disc disease), lumbar 02/04/2022   Lumbosacral facet syndrome (Multilevel)  (Bilateral) 02/04/2022   Osteoarthritis of lumbar spine 02/04/2022   Erythema ab igne 12/23/2021   Allergic conjunctivitis of both eyes 12/23/2021   Chronic hip pain (3ry area of Pain) (Bilateral) (L>R) 12/22/2021   Chronic lower extremity pain (2ry area of Pain) (Bilateral) (L>R) 12/22/2021   Abnormal MRI, lumbar spine (01/31/2020) 12/22/2021   Grade 1 Anterolisthesis of lumbar spine (L3/L4) & (5 mm) (L4/L5) 12/22/2021   Chronic pain syndrome 11/17/2021   High risk medication use 11/17/2021   Disorder of skeletal system 11/17/2021   Problems influencing health status 11/17/2021   Other hyperlipidemia 10/21/2021   Polypharmacy 08/12/2021   Chronic low back pain (1ry area of Pain) (Bilateral) (L>R) w/o sciatica 06/18/2021   Elevated blood pressure reading 05/23/2021   Osteopenia of neck of femur (Right) 09/06/2020   Obesity 08/29/2020   Parkinson disease 02/22/2020   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   Bipolar disorder, in full remission, most recent episode depressed ([place]) 11/08/2019   Vitamin D insufficiency 08/24/2019   Insomnia due to mental condition 05/31/2019   Bipolar I disorder, most recent episode depressed ([place]) 05/31/2019   Alcohol use disorder, moderate, in sustained remission ([place]) 05/31/2019   Elevated tumor markers 05/27/2019   GAD (generalized anxiety disorder) 04/10/2019   MDD (major depressive disorder), severe ([place]) 01/31/2019   High serum carbohydrate antigen 19-9 (CA19-9) 12/01/2018   Adjustment disorder with anxious mood 11/24/2018   Essential hypertension 11/24/2018   B12 deficiency 10/17/2018   Incisional hernia, without obstruction or gangrene 09/12/2018   Genetic testing 04/07/2018   Family history of breast cancer 03/24/2018   Family history of lung cancer 03/24/2018   Malignant neoplasm of upper-outer quadrant of right breast in female, estrogen receptor positive ([place]) 03/17/2018   Malignant neoplasm of upper-inner quadrant of right breast in female,  estrogen receptor positive ([place]) 03/17/2018   History of psychosis 02/07/2018   History of insomnia 02/07/2018    ONSET DATE: Pt diagnosed with PD in 2019  REFERRING DIAG:  G20 (ICD-10-CM) - Parkinson's disease      THERAPY DIAG:  Muscle weakness (generalized)  Unsteadiness on feet  Other lack of coordination  Difficulty in walking, not elsewhere classified  Rationale for Evaluation and Treatment: Rehabilitation  SUBJECTIVE:  SUBJECTIVE STATEMENT: Pt reports she did not do HEP because she was too exhausted. She rates current pain level 6/10. She reports no stumbles/falls   Pain: 6/10 in low back (chronic) with some radiating pain into LE -not specified which  Pt accompanied by: significant other self  PERTINENT HISTORY:  Pt is a pleasant 76 yo female who was diagnosed with PD in 2019. She is known to clinic and [facility], and was last seen for LSVT BIG Sept-Oct 2022. Pt with chronic pain and has become increasingly sedentary as a result exacerbating mobility difficulties due to PD. Current concerns: Pt reports L and R hip pain and back pain. Pt was being seen by pain management clinic. Pt has had injections that provided relief at the time. Pt's spouse reports her physician determined surgery was not an option for her pain so the plan is to manage it. Pt spouse thinks pain has resulted in reluctance for pt to move. Pt currently sedentary, doesn't walk much, but when she does she holds on to furniture for support due to balance problems. When pt first stands she must hold on to something due to mix of pain and/or dizziness (described as lightheadedness). Her pain can go from 3/10 to 9/10. It does improve once she starts moving. Pt has increased difficulty with multiple activities including: getting in  and out of her bed, getting in and out of her car, freezing, decreased gait speed, decreased balance and cognition, and difficulty using BUE due to tremors.  Per chart other PMH includes hx of breast cancer, HTN, MCI, insomnia, cataracts, bipolar 1, alcohol use disorder, GAD, MDD, osteopenia of neck of R femur, chronic B low back pain with radiating pain to buttock and posterior thighs and calves. Pt with multiple comorbidities, please refer to chart for full details    PAIN: from eval Are you having pain? Yes: NPRS scale: not rated currently but can reach 9/10 Pain location: L hip and L leg Pain description: pt has difficulty describing Aggravating factors: unsure Relieving factors: laying in the recliner helps, movement  PRECAUTIONS: Fall  WEIGHT BEARING RESTRICTIONS: No  FALLS: Has patient fallen in last 6 months? Yes. Number of falls 1x, leg tangled in chair leg when getting up from a chair  LIVING ENVIRONMENT: Lives with: lives with their spouse Lives in: House/apartment Stairs:  Has following equipment at home:  pt not currently using AD, holds onto walls/furniture, does have walker and SPC at home  PLOF: Independent with basic ADLs  PATIENT GOALS: improve mobility  IMAGING: via chart  07/24/22 DG BONE density " ASSESSMENT: The probability of a major osteoporotic fracture is 15.8% within the next ten years.   The probability of a hip fracture is 2.9% within the next ten years."  12/22/21 DG lumbar spine " IMPRESSION: 1. No acute displaced fracture or traumatic listhesis of the lumbar spine. 2. Interval development of grade 1 anterolisthesis of L3 on L4. Stable grade 1 anterolisthesis of L4 on L5. Appears stable on flexion extension views. 3.  Aortic Atherosclerosis (ICD10-I70.0)."  Please refer to chart for full details  OBJECTIVE: taken from eval unless otherwise indicated  LSVT BIG EVALUATION & EXAMINATION   Neurological and Other Medical Information:   Pt  known to clinic some answers via chart (2022) What were your initial symptoms of Parkinson's disease? first diagnosed 2019   Do you have tremors?  Yes: R hand   Do you have any pain? Yes: B hip and back, refer to above  Medical Information:    Medication for Parkinson's disease: sinemet   Motor Symptoms:    When did you first start to notice changes in your movement you associate with Parkinson's disease? Recent changes following d/c from previous LSVT and HH - increasingly sedentary due to pain per spouse   What are your current symptoms? Difficulty getting out of a chair, sometimes has difficulty getting in and out of bed, difficult with walking, still dresses herself but is mod I and sometimes must sit to dress, requires assistance with bathing, needs assistance with meal prep (due to balance and difficulty using her hands), has general difficulty with sequencing tasks/activities   What do you do when you want to move the best you possibly can? Pt does not know    Has Parkinson's disease caused you to move less or be less active? Yes: feels most recent contribution is due to pain, freezing  Have you noticed if your movement is slower than it used to be? For example, walking, getting dressed, doing household chores, bathing, etc. Yes: pt spouse reports difficulty maintaining at home exercises following discharge   Have you or others noticed any changes in your posture? Yes: pt notices she doesn't always stand up straight   How many (if any) falls have you had in the last six months? 1x     Have you noticed any freezing with your movement? Yes:     Are there some activities you now need help with because of your Parkinson's disease? See above     Assessment:     MMT: deferred due to time   Donning jacket: 17 seconds Doffing jacket: 7 seconds   Opening jar: 5.7 seconds with multiple attempts   Sit to stand assessment:  5x STS: 27 seconds    Gait assessments:  10 MWT:    0.58 m/s with rollator     Functional Component movements:   Sit to stand (required) Donning jacket (arm through sleeve) Opening jar Stepping over and on to obstacle (simulate getting in car simulation) Big and tall posture (stand BIG)   Hierarchies Getting in and out of bed 2. Ambulating for distance and over uneven surfaces/obstacles with rollator or LRD    PATIENT SURVEYS:  FOTO 40 (goal 49) - filled out by spouse due to pt impaired cognition   PATIENT EDUCATION:  Education details: Pt educated throughout session about proper posture and technique with exercises. Improved exercise technique, movement at target joints, use of target muscles after min to mod verbal, visual, tactile cues. LSVT BIG HEP Person educated: Patient and Spouse Education method: Explanation Education comprehension: verbalized understanding   TODAY'S TREATMENT:                                                                                                                                         DATE:    LSVT BIG Treatment:  Patient seen  for LSVT Daily Session Maximal Daily Exercises for facilitation/coordination of movement Sustained movements are designed to rescale the amplitude of movement output for generalization to daily functional activities.  Maximal Adapted Daily Exercises: Floor to Ceiling 8 with 10 second hold Side to Side 8 Bilateral with 10 second hold Forward Step and Reach 10 Bilateral - instructed in hand flicks for LUE  Sideways Step and Reach 10 Bilateral Backwards Step and Reach 10 Bilateral Forwards Rock and Reach 10 Bilateral Sideways Rock and Reach 10 Bilateral  Maximal Daily Exercise Comments: needs modeling for correct positions, VC/TC for big reach, increased amplitude, increased effort >90% of time. Rest breaks taken throughout due to fatigue and for pain modulation (chronic BLE and LB pain). Pt's pain closely monitored throughout.Slightly more difficulty today with LUE  shoulder ext and finger abd.    Functional Component movements:  5 reps performed of each Sit to stand (required) 2. Donning jacket (arm through sleeve) 3. Opening jar - instructed in use of finger flicks 4. Stepping over and on to obstacle (simulate getting in car simulation) - using 6" step 5. Big and tall posture (stand/sit BIG)   Hierarchies Getting in and out of bed - instruction today in rolling, scooting, large amplitude movement. Attempted supine>sit with UE support since pt reported discomfort with rolling, however, pt report similar level of discomfort with this technique. Will continue to trial techniques to reduce low back pain with bed mobility 2. Ambulating for distance and over uneven surfaces/obstacles without AD, with CGA- 2x10 meters    Gait - performance and progress: incorporated into hierarchy task above, performed 2x10 meters  PT provides cuing for big steps, big posture, big arm swing >90% of time to override hypokinesia/bradykinesia. PT provides CGA throughout.  Moderately busy environment  Carryover Assignment: Big posture when pushing grocery store cart later today     [place]:  Pt to perform all 7 LSVT BIG  (adapted) maximal daily exercises, 5 Functional Component Tasks and big walking and carryover assignment (see above)  GOALS: Goals reviewed with patient? Yes  SHORT TERM GOALS: Target date: 10/27/2022    Patient will be independent in home exercise program to improve strength/mobility for better functional independence with ADLs. Baseline: LSVT BIG protocol to be initiated next session Goal status: INITIAL   LONG TERM GOALS: Target date: 11/11/2022   Patient will increase FOTO score to equal to or greater than 49 to demonstrate statistically significant improvement in mobility and quality of life.  Baseline: 40 Goal status: INITIAL  2.  Patient (> 46 years old) will complete five times sit to stand test in < 15 seconds indicating  decreased fall risk.  Baseline: 27 sec Goal status: INITIAL  3.  Patient will increase 10 meter walk test to >1.93ms with WFL B step-length, upright posture for >90% of test to improve ease with gait and to override hypokentic and bradykinetic movement.  Baseline: 0.58 m/s with 4WW Goal status: INITIAL  4.  Patient will reduce time to don jacket by at least 5 seconds in order to increase ease with dressing and override bradykinetic movement Baseline: 17 seconds Goal status: INITIAL  5.  Pt will demo or report ability to open standard jar using only 1-2 attempts in order to exhibit improved functional mobility for ADLs.  Baseline: 5.7 seconds with multiple attempts to turn jar lid to open Goal status: INITIAL  ASSESSMENT:  CLINICAL IMPRESSION: PT provided education on importance of performing HEP and how to incorporate into  routine (pt reported not performing HEP). She required slightly more cuing today, possibly due to carryover of fatigue from yesterday's session. Main focus with gait was to increase arm-swing B and upright posture. Pt able to correct with multi-modal cuing, but responded best to TC. The pt will benefit from further LSVT BIG PT in order to address these deficits to improve overall functional mobility, QOL and to decrease fall risk.  OBJECTIVE IMPAIRMENTS: Abnormal gait, decreased balance, decreased cognition, decreased coordination, decreased endurance, decreased knowledge of use of DME, decreased mobility, difficulty walking, decreased strength, dizziness, hypomobility, impaired UE functional use, improper body mechanics, postural dysfunction, and pain.   ACTIVITY LIMITATIONS: lifting, bending, standing, squatting, stairs, transfers, bed mobility, bathing, dressing, hygiene/grooming, and locomotion level  PARTICIPATION LIMITATIONS: meal prep, cleaning, laundry, medication management, personal finances, driving, shopping, community activity, and yard work  PERSONAL  FACTORS: Age, Fitness, Sex, Time since onset of injury/illness/exacerbation, and 3+ comorbidities: PMH includes hx of breast cancer, HTN, MCI, insomnia, cataracts, bipolar 1, alcohol use disorder, GAD, MDD, osteopenia of neck of R femur, chronic B low back pain with radiating pain to buttock and posterior thighs and calves. Pt with multiple comorbidities, please refer to chart for full details  are also affecting patient's functional outcome.   REHAB POTENTIAL: Fair    CLINICAL DECISION MAKING: Evolving/moderate complexity  EVALUATION COMPLEXITY: High  PLAN:  PT FREQUENCY: 4x/week  PT DURATION: 6 weeks  PLANNED INTERVENTIONS: Therapeutic exercises, Therapeutic activity, Neuromuscular re-education, Balance training, Gait training, Patient/Family education, Self Care, Joint mobilization, Stair training, Vestibular training, Canalith repositioning, Orthotic/Fit training, DME instructions, Wheelchair mobility training, Spinal mobilization, Cryotherapy, Moist heat, Splintting, Taping, Manual therapy, and Re-evaluation  PLAN FOR NEXT SESSION: adapted maximal daily exercises, functional component tasks, hierarchy tasks, big walking   Zollie Pee, PT 10/06/2022, 1:37 PM

## 2022-10-06 NOTE — Therapy (Signed)
OUTPATIENT SPEECH LANGUAGE PATHOLOGY LSVT LOUD TREATMENT   Patient Name: Kendra [name] MRN: [id number] DOB:08-14-1946, 76 y.o., female Today's Date: 10/06/2022  PCP: Lavon Paganini, MD REFERRING PROVIDER: Tally Joe, FNP   End of Session - 10/06/22 1335     Visit Number 3    Number of Visits 17    Date for SLP Re-Evaluation 11/28/22    SLP Start Time 44    SLP Stop Time  1200    SLP Time Calculation (min) 60 min    Activity Tolerance Patient tolerated treatment well             Past Medical History:  Diagnosis Date   Anxiety    Breast cancer ([place]) 03/2018   right breast cancer at 11:00 and 1:00   Bursitis    bilateral hips and knees   Complication of anesthesia    Depression    Family history of breast cancer 03/24/2018   Family history of lung cancer 03/24/2018   History of alcohol dependence ([place]) 2007   no ETOH; resolved since 2007   History of hiatal hernia    History of psychosis 2014   due to sleep disturbance   Hypertension    Parkinson disease    Personal history of radiation therapy 06/2018-07/2018   right breast ca   PONV (postoperative nausea and vomiting)    Past Surgical History:  Procedure Laterality Date   BREAST BIOPSY Right 03/14/2018   11:00 DCIS and invasive ductal carcinoma   BREAST BIOPSY Right 03/14/2018   1:00 Invasive ductal carcinoma   BREAST LUMPECTOMY Right 04/27/2018   lumpectomy of 11 and 1:00 cancers, clear margins, negative LN   BREAST LUMPECTOMY WITH RADIOACTIVE SEED AND SENTINEL LYMPH NODE BIOPSY Right 04/27/2018   Procedure: RIGHT BREAST LUMPECTOMY WITH RADIOACTIVE SEED X 2 AND RIGHT SENTINEL LYMPH NODE BIOPSY;  Surgeon: [name], MD;  Location: [facility];  Service: General;  Laterality: Right;   CATARACT EXTRACTION W/PHACO Left 08/30/2019   Procedure: CATARACT EXTRACTION PHACO AND INTRAOCULAR LENS PLACEMENT ([place]) LEFT panoptix toric  01:20.5  12.7%  10.26;  Surgeon: Leandrew Koyanagi, MD;   Location: [facility];  Service: Ophthalmology;  Laterality: Left;   CATARACT EXTRACTION W/PHACO Right 09/20/2019   Procedure: CATARACT EXTRACTION PHACO AND INTRAOCULAR LENS PLACEMENT ([facility]) RIGHT 5.71  00:36.4  15.7%;  Surgeon: Leandrew Koyanagi, MD;  Location: [facility];  Service: Ophthalmology;  Laterality: Right;   LAPAROTOMY N/A 02/10/2018   Procedure: EXPLORATORY LAPAROTOMY;  Surgeon: Isabel Caprice, MD;  Location: [facility];  Service: Gynecology;  Laterality: N/A;   MASS EXCISION  01/2018   abdominal   OVARIAN CYST REMOVAL     SALPINGOOPHORECTOMY Bilateral 02/10/2018   Procedure: BILATERAL SALPINGO OOPHORECTOMY; PERITONEAL WASHINGS;  Surgeon: Isabel Caprice, MD;  Location: [facility];  Service: Gynecology;  Laterality: Bilateral;   TONSILLECTOMY     TONSILLECTOMY AND ADENOIDECTOMY  1953   Patient Active Problem List   Diagnosis Date Noted   Blepharitis of upper and lower eyelids of both eyes 08/17/2022   Subacute cough 08/17/2022   History of allergy to shellfish 02/24/2022   Chronic low back pain of over 3 months duration 02/04/2022   Osteoarthritis of  AC (acromioclavicular) joint (Left) 02/04/2022   Osteoarthritis of hip (Right) 02/04/2022   Impaction fracture of distal end of radius and ulna, sequela (Left) 02/04/2022   Osteoarthritis of 1st (thumb) carpometacarpal (CMC) joint of hand (Left) 02/04/2022   Lumbosacral facet arthropathy (Multilevel) (Bilateral) (T12-S1)  02/04/2022   Lumbar central spinal stenosis w/o neurogenic claudication (L2-3, L3-4, L4-5) 02/04/2022   Lumbar foraminal stenosis (Bilateral: L2-3, L4-5) (Right: (Severe) L4-5) 02/04/2022   Lumbar lateral recess stenosis (Bilateral: L2-3, L3-4, and L4-5) (Severe) 02/04/2022   DDD (degenerative disc disease), lumbar 02/04/2022   Lumbosacral facet syndrome (Multilevel) (Bilateral) 02/04/2022   Osteoarthritis of lumbar spine 02/04/2022   Erythema ab igne 12/23/2021   Allergic conjunctivitis of both  eyes 12/23/2021   Chronic hip pain (3ry area of Pain) (Bilateral) (L>R) 12/22/2021   Chronic lower extremity pain (2ry area of Pain) (Bilateral) (L>R) 12/22/2021   Abnormal MRI, lumbar spine (01/31/2020) 12/22/2021   Grade 1 Anterolisthesis of lumbar spine (L3/L4) & (5 mm) (L4/L5) 12/22/2021   Chronic pain syndrome 11/17/2021   High risk medication use 11/17/2021   Disorder of skeletal system 11/17/2021   Problems influencing health status 11/17/2021   Other hyperlipidemia 10/21/2021   Polypharmacy 08/12/2021   Chronic low back pain (1ry area of Pain) (Bilateral) (L>R) w/o sciatica 06/18/2021   Elevated blood pressure reading 05/23/2021   Osteopenia of neck of femur (Right) 09/06/2020   Obesity 08/29/2020   Parkinson disease 02/22/2020   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   Bipolar disorder, in full remission, most recent episode depressed ([place]) 11/08/2019   Vitamin D insufficiency 08/24/2019   Insomnia due to mental condition 05/31/2019   Bipolar I disorder, most recent episode depressed ([place]) 05/31/2019   Alcohol use disorder, moderate, in sustained remission ([place]) 05/31/2019   Elevated tumor markers 05/27/2019   GAD (generalized anxiety disorder) 04/10/2019   MDD (major depressive disorder), severe ([place]) 01/31/2019   High serum carbohydrate antigen 19-9 (CA19-9) 12/01/2018   Adjustment disorder with anxious mood 11/24/2018   Essential hypertension 11/24/2018   B12 deficiency 10/17/2018   Incisional hernia, without obstruction or gangrene 09/12/2018   Genetic testing 04/07/2018   Family history of breast cancer 03/24/2018   Family history of lung cancer 03/24/2018   Malignant neoplasm of upper-outer quadrant of right breast in female, estrogen receptor positive ([place]) 03/17/2018   Malignant neoplasm of upper-inner quadrant of right breast in female, estrogen receptor positive ([place]) 03/17/2018   History of psychosis 02/07/2018   History of insomnia 02/07/2018    Onset date: PD dx  2021  REFERRING DIAG: Parkinson's disease  THERAPY DIAG:  No diagnosis found.  Rationale for Evaluation and Treatment Rehabilitation  SUBJECTIVE:   SUBJECTIVE STATEMENT: "I didn't practice."  Pt accompanied by:  self  PERTINENT HISTORY: Patient is a 76 y.o. female with history of Parkinson's disease (dx 2021), anxiety, depression, psychosis, HTN, bipolar disorder, alcoholism, memory issues since 2005, hx head trauma. Referred for LSVT BIG and LOUD; known to our clinic from prior course of therapy completed in 2022.  DIAGNOSTIC FINDINGS: MRI Brain 08/08/2018: "1. No acute intracranial abnormality and largely unremarkable for age gray and white matter signal. 2. NeuroQuant volumetric analysis of the brain suggests age advanced brain volume loss, see details on [facility] PACS."  PAIN:  Are you having pain? Yes: NPRS scale: 7/10 Pain location: generalized   PATIENT GOALS communicate better  OBJECTIVE:   TODAY'S TREATMENT: LSVT Daily tasks with usual moderate cues. Loud /a/ averaged 81 dB, duration 13.2 seconds, pitch avg 228 Hz.  Max F0 for pitch glides was 295 Hz (82 dB avg), min F0 for low glides was 168 Hz (80 dB avg). Functional phrases averaged 73 dB with occasional mod cues. Hierarchical loudness drills at word/phrase level averaged 71 dB, with occasional mod cues for  effort, loudness. Pt had fatigue in last 10 minutes of session, when avg loudness dipped to 68 dB despite cuing. Spontaneous responses averaged 63 dB, however pt attempted self-correction on 3 occasions.    PATIENT EDUCATION: Education details: Home practice routine and tracking Person educated: Patient and Spouse Education method: Explanation and Verbal cues Education comprehension: verbalized understanding   HOME EXERCISE PROGRAM: To be provided next session     GOALS: Goals reviewed with patient? Yes  SHORT TERM GOALS: Target date: 10 sessions  The patient will complete Daily Tasks (Maximum duration  "ah", High/Lows, and Functional Phrases) at average loudness >/= 85 dB and with loud, good quality voice with min cues.  Baseline: Goal status: INITIAL  2.  The patient will complete Hierarchal Speech Loudness reading drills (words/phrases, sentences) at average >/= 75 dB and with loud, good quality voice with min cues. Baseline:  Goal status: INITIAL  3.  The patient will participate in 5-8 minutes conversation, maintaining average loudness of 75 dB and loud, good quality voice with min cues. Baseline:  Goal status: INITIAL   LONG TERM GOALS: Target date:   The patient will complete Daily Tasks (Maximum duration "ah", High/Lows, and Functional Phrases) at average loudness >/= 85 dB and with loud, good quality voice Baseline:  Goal status: INITIAL  2.  The patient will complete Hierarchal Speech Loudness reading drills (words/phrases, sentences, and paragraph) at average >/= 75 dB and with loud, good quality voice. Baseline:  Goal status: INITIAL  3.  The patient will participate in 15-20 minutes conversation, maintaining average loudness of >/= 75 dB and loud, good quality voice. Baseline:  Goal status: INITIAL  4.  Patient will report improved communication effectiveness as measured by Communicative Effectiveness Survey.  Baseline: 18/32 on 09/29/22 Goal status: INITIAL   ASSESSMENT:  CLINICAL IMPRESSION: Patient is a 76 y.o. female who presents with mild-moderate dysarthria characterized by reduced vocal intensity, reduced pitch variability, hoarse vocal quality, and subjectively low pitch for gender. Patient and spouse report noting changes in her vocal quality and ability to project her voice. She was stimulable for improvements in intensity, vocal quality, and pitch range with clinician model and cues for "Loud like me." I recommend course of skilled ST for LSVT-LOUD to improve vocal quality and intensity for communicating with family and friends.   OBJECTIVE IMPAIRMENTS  include dysarthria and voice disorder. These impairments are limiting patient from effectively communicating at home and in community. Factors affecting potential to achieve goals and functional outcome are ability to learn/carryover information and previous level of function.. Patient will benefit from skilled SLP services to address above impairments and improve overall function.  REHAB POTENTIAL: Good  PLAN: SLP FREQUENCY: 4x/week  SLP DURATION: 4 weeks  PLANNED INTERVENTIONS: Cueing hierachy, Functional tasks, SLP instruction and feedback, Compensatory strategies, Patient/family education, and LSVT-LOUD      Deneise Lever, MS, Actor [phone]

## 2022-10-07 ENCOUNTER — Ambulatory Visit: Payer: Medicare PPO | Admitting: Speech Pathology

## 2022-10-07 ENCOUNTER — Ambulatory Visit: Payer: Medicare PPO

## 2022-10-07 ENCOUNTER — Other Ambulatory Visit: Payer: Self-pay | Admitting: Family Medicine

## 2022-10-07 DIAGNOSIS — M79605 Pain in left leg: Secondary | ICD-10-CM | POA: Diagnosis not present

## 2022-10-07 DIAGNOSIS — M25552 Pain in left hip: Secondary | ICD-10-CM

## 2022-10-07 DIAGNOSIS — M79604 Pain in right leg: Secondary | ICD-10-CM | POA: Diagnosis not present

## 2022-10-07 DIAGNOSIS — R278 Other lack of coordination: Secondary | ICD-10-CM

## 2022-10-07 DIAGNOSIS — M6281 Muscle weakness (generalized): Secondary | ICD-10-CM | POA: Diagnosis not present

## 2022-10-07 DIAGNOSIS — R471 Dysarthria and anarthria: Secondary | ICD-10-CM | POA: Diagnosis not present

## 2022-10-07 DIAGNOSIS — R262 Difficulty in walking, not elsewhere classified: Secondary | ICD-10-CM | POA: Diagnosis not present

## 2022-10-07 DIAGNOSIS — R2681 Unsteadiness on feet: Secondary | ICD-10-CM

## 2022-10-07 DIAGNOSIS — R41841 Cognitive communication deficit: Secondary | ICD-10-CM | POA: Diagnosis not present

## 2022-10-07 NOTE — Therapy (Signed)
OUTPATIENT PHYSICAL THERAPY LSVT BIG TREATMENT   Patient Name: Kendra [name] MRN: [id number] DOB:07-29-1946, 76 y.o., female Today's Date: 10/07/2022  PCP: Virginia Crews, MD REFERRING PROVIDER: Virginia Crews, MD  END OF SESSION:  PT End of Session - 10/07/22 1201     Visit Number 4    Number of Visits 17    Date for PT Re-Evaluation 11/10/22    PT Start Time 1100    PT Stop Time 1158    PT Time Calculation (min) 58 min    Equipment Utilized During Treatment Gait belt    Activity Tolerance Patient tolerated treatment well;Patient limited by pain;Patient limited by fatigue    Behavior During Therapy [facility] for tasks assessed/performed              Past Medical History:  Diagnosis Date   Anxiety    Breast cancer ([place]) 03/2018   right breast cancer at 11:00 and 1:00   Bursitis    bilateral hips and knees   Complication of anesthesia    Depression    Family history of breast cancer 03/24/2018   Family history of lung cancer 03/24/2018   History of alcohol dependence ([place]) 2007   no ETOH; resolved since 2007   History of hiatal hernia    History of psychosis 2014   due to sleep disturbance   Hypertension    Parkinson disease    Personal history of radiation therapy 06/2018-07/2018   right breast ca   PONV (postoperative nausea and vomiting)    Past Surgical History:  Procedure Laterality Date   BREAST BIOPSY Right 03/14/2018   11:00 DCIS and invasive ductal carcinoma   BREAST BIOPSY Right 03/14/2018   1:00 Invasive ductal carcinoma   BREAST LUMPECTOMY Right 04/27/2018   lumpectomy of 11 and 1:00 cancers, clear margins, negative LN   BREAST LUMPECTOMY WITH RADIOACTIVE SEED AND SENTINEL LYMPH NODE BIOPSY Right 04/27/2018   Procedure: RIGHT BREAST LUMPECTOMY WITH RADIOACTIVE SEED X 2 AND RIGHT SENTINEL LYMPH NODE BIOPSY;  Surgeon: [name], MD;  Location: [place];  Service: General;  Laterality: Right;   CATARACT EXTRACTION  W/PHACO Left 08/30/2019   Procedure: CATARACT EXTRACTION PHACO AND INTRAOCULAR LENS PLACEMENT ([place]) LEFT panoptix toric  01:20.5  12.7%  10.26;  Surgeon: Leandrew Koyanagi, MD;  Location: [place];  Service: Ophthalmology;  Laterality: Left;   CATARACT EXTRACTION W/PHACO Right 09/20/2019   Procedure: CATARACT EXTRACTION PHACO AND INTRAOCULAR LENS PLACEMENT ([facility]) RIGHT 5.71  00:36.4  15.7%;  Surgeon: Leandrew Koyanagi, MD;  Location: [place];  Service: Ophthalmology;  Laterality: Right;   LAPAROTOMY N/A 02/10/2018   Procedure: EXPLORATORY LAPAROTOMY;  Surgeon: Isabel Caprice, MD;  Location: [facility];  Service: Gynecology;  Laterality: N/A;   MASS EXCISION  01/2018   abdominal   OVARIAN CYST REMOVAL     SALPINGOOPHORECTOMY Bilateral 02/10/2018   Procedure: BILATERAL SALPINGO OOPHORECTOMY; PERITONEAL WASHINGS;  Surgeon: Isabel Caprice, MD;  Location: [facility];  Service: Gynecology;  Laterality: Bilateral;   TONSILLECTOMY     TONSILLECTOMY AND ADENOIDECTOMY  1953   Patient Active Problem List   Diagnosis Date Noted   Blepharitis of upper and lower eyelids of both eyes 08/17/2022   Subacute cough 08/17/2022   History of allergy to shellfish 02/24/2022   Chronic low back pain of over 3 months duration 02/04/2022   Osteoarthritis of  AC (acromioclavicular) joint (Left) 02/04/2022   Osteoarthritis of hip (Right) 02/04/2022   Impaction fracture of  distal end of radius and ulna, sequela (Left) 02/04/2022   Osteoarthritis of 1st (thumb) carpometacarpal [facility]) joint of hand (Left) 02/04/2022   Lumbosacral facet arthropathy (Multilevel) (Bilateral) (T12-S1) 02/04/2022   Lumbar central spinal stenosis w/o neurogenic claudication (L2-3, L3-4, L4-5) 02/04/2022   Lumbar foraminal stenosis (Bilateral: L2-3, L4-5) (Right: (Severe) L4-5) 02/04/2022   Lumbar lateral recess stenosis (Bilateral: L2-3, L3-4, and L4-5) (Severe) 02/04/2022   DDD (degenerative disc disease), lumbar  02/04/2022   Lumbosacral facet syndrome (Multilevel) (Bilateral) 02/04/2022   Osteoarthritis of lumbar spine 02/04/2022   Erythema ab igne 12/23/2021   Allergic conjunctivitis of both eyes 12/23/2021   Chronic hip pain (3ry area of Pain) (Bilateral) (L>R) 12/22/2021   Chronic lower extremity pain (2ry area of Pain) (Bilateral) (L>R) 12/22/2021   Abnormal MRI, lumbar spine (01/31/2020) 12/22/2021   Grade 1 Anterolisthesis of lumbar spine (L3/L4) & (5 mm) (L4/L5) 12/22/2021   Chronic pain syndrome 11/17/2021   High risk medication use 11/17/2021   Disorder of skeletal system 11/17/2021   Problems influencing health status 11/17/2021   Other hyperlipidemia 10/21/2021   Polypharmacy 08/12/2021   Chronic low back pain (1ry area of Pain) (Bilateral) (L>R) w/o sciatica 06/18/2021   Elevated blood pressure reading 05/23/2021   Osteopenia of neck of femur (Right) 09/06/2020   Obesity 08/29/2020   Parkinson disease 02/22/2020   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   Bipolar disorder, in full remission, most recent episode depressed ([place]) 11/08/2019   Vitamin D insufficiency 08/24/2019   Insomnia due to mental condition 05/31/2019   Bipolar I disorder, most recent episode depressed ([place]) 05/31/2019   Alcohol use disorder, moderate, in sustained remission ([place]) 05/31/2019   Elevated tumor markers 05/27/2019   GAD (generalized anxiety disorder) 04/10/2019   MDD (major depressive disorder), severe ([place]) 01/31/2019   High serum carbohydrate antigen 19-9 (CA19-9) 12/01/2018   Adjustment disorder with anxious mood 11/24/2018   Essential hypertension 11/24/2018   B12 deficiency 10/17/2018   Incisional hernia, without obstruction or gangrene 09/12/2018   Genetic testing 04/07/2018   Family history of breast cancer 03/24/2018   Family history of lung cancer 03/24/2018   Malignant neoplasm of upper-outer quadrant of right breast in female, estrogen receptor positive ([place]) 03/17/2018   Malignant neoplasm  of upper-inner quadrant of right breast in female, estrogen receptor positive ([place]) 03/17/2018   History of psychosis 02/07/2018   History of insomnia 02/07/2018    ONSET DATE: Pt diagnosed with PD in 2019  REFERRING DIAG:  G20 (ICD-10-CM) - Parkinson's disease      THERAPY DIAG:  Muscle weakness (generalized)  Pain in left hip  Difficulty in walking, not elsewhere classified  Unsteadiness on feet  Other lack of coordination  Rationale for Evaluation and Treatment: Rehabilitation  SUBJECTIVE:  SUBJECTIVE STATEMENT: Pt reports L hip pain currently, rates 6/10. Pt reports she did not do her exercises yesterday because she reports she had people working on the house.   Pain: 6/10 in L hip (chronic) with some radiating pain into LE -not specified which  Pt accompanied by: significant other self  PERTINENT HISTORY:  Pt is a pleasant 76 yo female who was diagnosed with PD in 2019. She is known to clinic and [facility], and was last seen for LSVT BIG Sept-Oct 2022. Pt with chronic pain and has become increasingly sedentary as a result exacerbating mobility difficulties due to PD. Current concerns: Pt reports L and R hip pain and back pain. Pt was being seen by pain management clinic. Pt has had injections that provided relief at the time. Pt's spouse reports her physician determined surgery was not an option for her pain so the plan is to manage it. Pt spouse thinks pain has resulted in reluctance for pt to move. Pt currently sedentary, doesn't walk much, but when she does she holds on to furniture for support due to balance problems. When pt first stands she must hold on to something due to mix of pain and/or dizziness (described as lightheadedness). Her pain can go from 3/10 to 9/10. It does improve once  she starts moving. Pt has increased difficulty with multiple activities including: getting in and out of her bed, getting in and out of her car, freezing, decreased gait speed, decreased balance and cognition, and difficulty using BUE due to tremors.  Per chart other PMH includes hx of breast cancer, HTN, MCI, insomnia, cataracts, bipolar 1, alcohol use disorder, GAD, MDD, osteopenia of neck of R femur, chronic B low back pain with radiating pain to buttock and posterior thighs and calves. Pt with multiple comorbidities, please refer to chart for full details    PAIN: from eval Are you having pain? Yes: NPRS scale: not rated currently but can reach 9/10 Pain location: L hip and L leg Pain description: pt has difficulty describing Aggravating factors: unsure Relieving factors: laying in the recliner helps, movement  PRECAUTIONS: Fall  WEIGHT BEARING RESTRICTIONS: No  FALLS: Has patient fallen in last 6 months? Yes. Number of falls 1x, leg tangled in chair leg when getting up from a chair  LIVING ENVIRONMENT: Lives with: lives with their spouse Lives in: House/apartment Stairs:  Has following equipment at home:  pt not currently using AD, holds onto walls/furniture, does have walker and SPC at home  PLOF: Independent with basic ADLs  PATIENT GOALS: improve mobility  IMAGING: via chart  07/24/22 DG BONE density " ASSESSMENT: The probability of a major osteoporotic fracture is 15.8% within the next ten years.   The probability of a hip fracture is 2.9% within the next ten years."  12/22/21 DG lumbar spine " IMPRESSION: 1. No acute displaced fracture or traumatic listhesis of the lumbar spine. 2. Interval development of grade 1 anterolisthesis of L3 on L4. Stable grade 1 anterolisthesis of L4 on L5. Appears stable on flexion extension views. 3.  Aortic Atherosclerosis (ICD10-I70.0)."  Please refer to chart for full details  OBJECTIVE: taken from eval unless otherwise  indicated  LSVT BIG EVALUATION & EXAMINATION   Neurological and Other Medical Information:   Pt known to clinic some answers via chart (2022) What were your initial symptoms of Parkinson's disease? first diagnosed 2019   Do you have tremors?  Yes: R hand   Do you have any pain? Yes: B hip and  back, refer to above    Medical Information:    Medication for Parkinson's disease: sinemet   Motor Symptoms:    When did you first start to notice changes in your movement you associate with Parkinson's disease? Recent changes following d/c from previous LSVT and HH - increasingly sedentary due to pain per spouse   What are your current symptoms? Difficulty getting out of a chair, sometimes has difficulty getting in and out of bed, difficult with walking, still dresses herself but is mod I and sometimes must sit to dress, requires assistance with bathing, needs assistance with meal prep (due to balance and difficulty using her hands), has general difficulty with sequencing tasks/activities   What do you do when you want to move the best you possibly can? Pt does not know    Has Parkinson's disease caused you to move less or be less active? Yes: feels most recent contribution is due to pain, freezing  Have you noticed if your movement is slower than it used to be? For example, walking, getting dressed, doing household chores, bathing, etc. Yes: pt spouse reports difficulty maintaining at home exercises following discharge   Have you or others noticed any changes in your posture? Yes: pt notices she doesn't always stand up straight   How many (if any) falls have you had in the last six months? 1x     Have you noticed any freezing with your movement? Yes:     Are there some activities you now need help with because of your Parkinson's disease? See above     Assessment:     MMT: deferred due to time   Donning jacket: 17 seconds Doffing jacket: 7 seconds   Opening jar: 5.7 seconds with  multiple attempts   Sit to stand assessment:  5x STS: 27 seconds    Gait assessments:  10 MWT:   0.58 m/s with rollator     Functional Component movements:   Sit to stand (required) Donning jacket (arm through sleeve) Opening jar Stepping over and on to obstacle (simulate getting in car simulation) Big and tall posture (stand BIG)   Hierarchies Getting in and out of bed 2. Ambulating for distance and over uneven surfaces/obstacles with rollator or LRD    PATIENT SURVEYS:  FOTO 40 (goal 49) - filled out by spouse due to pt impaired cognition   PATIENT EDUCATION:  Education details: Pt educated throughout session about proper posture and technique with exercises. Improved exercise technique, movement at target joints, use of target muscles after min to mod verbal, visual, tactile cues. LSVT BIG HEP Person educated: Patient and Spouse Education method: Explanation Education comprehension: verbalized understanding   TODAY'S TREATMENT:                                                                                                                                         DATE:  LSVT BIG Treatment:  Patient seen for LSVT Daily Session Maximal Daily Exercises for facilitation/coordination of movement Sustained movements are designed to rescale the amplitude of movement output for generalization to daily functional activities.  Maximal Adapted Daily Exercises: Floor to Ceiling 8 with 10 second hold Side to Side 8 Bilateral with 10 second hold Forward Step and Reach 10 Bilateral - instructed in hand flicks for LUE  Sideways Step and Reach 10 Bilateral Backwards Step and Reach 10 Bilateral Forwards Rock and Reach 10 Bilateral Sideways Rock and Reach 10 Bilateral  Maximal Daily Exercise Comments: needs modeling for correct positions, VC/TC for big reach, increased amplitude, increased effort >85% of time. Rest breaks taken throughout due to fatigue/wooziness and for pain  modulation (chronic BLE and LB pain, though pt reports no increase from baseline). Pt's pain and wooziness closely monitored throughout. Pt rates maximal daily exercises as hard.    Functional Component movements:  5 reps performed of each Sit to stand (required) 2. Donning jacket (arm through sleeve) 3. Opening jar - instructed in use of finger flicks 4. Stepping over and on to obstacle (simulate getting in car simulation) - using 6" step 5. Big and tall posture (stand/sit BIG)   Hierarchies Getting in and out of bed - instruction today focused on rolling, sidelye>sit each side of bed. Pt shows improved technique and reports no increase in LBP/hip pain 2. Ambulating for distance and over uneven surfaces/obstacles without AD, with CGA- incorporated into gait training 2x60 ft with rest break due to dizziness/lightheadedness (improved with rest)  Gait - 2x60 ft, no AD, close CGA. Rest break 1x due to dizziness (improved with rest). Focus on cuing for big steps, upright posture and arm-swing B, emphasis LUE   Carryover Assignment: Finger flicks prior to grabbing utensil to eat     HOME EXERCISE PROGRAM:  Pt to perform all 7 LSVT BIG  (adapted) maximal daily exercises, 5 Functional Component Tasks and big walking and carryover assignment (see above)  GOALS: Goals reviewed with patient? Yes  SHORT TERM GOALS: Target date: 10/27/2022    Patient will be independent in home exercise program to improve strength/mobility for better functional independence with ADLs. Baseline: LSVT BIG protocol to be initiated next session Goal status: INITIAL   LONG TERM GOALS: Target date: 11/11/2022   Patient will increase FOTO score to equal to or greater than 49 to demonstrate statistically significant improvement in mobility and quality of life.  Baseline: 40 Goal status: INITIAL  2.  Patient (> 57 years old) will complete five times sit to stand test in < 15 seconds indicating decreased fall  risk.  Baseline: 27 sec Goal status: INITIAL  3.  Patient will increase 10 meter walk test to >1.68ms with WFL B step-length, upright posture for >90% of test to improve ease with gait and to override hypokentic and bradykinetic movement.  Baseline: 0.58 m/s with 4WW Goal status: INITIAL  4.  Patient will reduce time to don jacket by at least 5 seconds in order to increase ease with dressing and override bradykinetic movement Baseline: 17 seconds Goal status: INITIAL  5.  Pt will demo or report ability to open standard jar using only 1-2 attempts in order to exhibit improved functional mobility for ADLs.  Baseline: 5.7 seconds with multiple attempts to turn jar lid to open Goal status: INITIAL  ASSESSMENT:  CLINICAL IMPRESSION: Pt continues to provide education on importance of performing HEP as pt reports she is not doing this. Pt able  to increase amount of time in standing performing maximal daily exercises at start of appointment, indicating increased activity tolerance. However, pt was more limited by fatigue by end of appointment. Pain did not increase above baseline with interventions. The pt will benefit from further LSVT BIG PT in order to address these deficits to improve overall functional mobility, QOL and to decrease fall risk.  OBJECTIVE IMPAIRMENTS: Abnormal gait, decreased balance, decreased cognition, decreased coordination, decreased endurance, decreased knowledge of use of DME, decreased mobility, difficulty walking, decreased strength, dizziness, hypomobility, impaired UE functional use, improper body mechanics, postural dysfunction, and pain.   ACTIVITY LIMITATIONS: lifting, bending, standing, squatting, stairs, transfers, bed mobility, bathing, dressing, hygiene/grooming, and locomotion level  PARTICIPATION LIMITATIONS: meal prep, cleaning, laundry, medication management, personal finances, driving, shopping, community activity, and yard work  PERSONAL FACTORS: Age,  Fitness, Sex, Time since onset of injury/illness/exacerbation, and 3+ comorbidities: PMH includes hx of breast cancer, HTN, MCI, insomnia, cataracts, bipolar 1, alcohol use disorder, GAD, MDD, osteopenia of neck of R femur, chronic B low back pain with radiating pain to buttock and posterior thighs and calves. Pt with multiple comorbidities, please refer to chart for full details  are also affecting patient's functional outcome.   REHAB POTENTIAL: Fair    CLINICAL DECISION MAKING: Evolving/moderate complexity  EVALUATION COMPLEXITY: High  PLAN:  PT FREQUENCY: 4x/week  PT DURATION: 6 weeks  PLANNED INTERVENTIONS: Therapeutic exercises, Therapeutic activity, Neuromuscular re-education, Balance training, Gait training, Patient/Family education, Self Care, Joint mobilization, Stair training, Vestibular training, Canalith repositioning, Orthotic/Fit training, DME instructions, Wheelchair mobility training, Spinal mobilization, Cryotherapy, Moist heat, Splintting, Taping, Manual therapy, and Re-evaluation  PLAN FOR NEXT SESSION: adapted maximal daily exercises, functional component tasks, hierarchy tasks, big walking   Zollie Pee, PT 10/07/2022, 12:07 PM

## 2022-10-07 NOTE — Therapy (Signed)
OUTPATIENT SPEECH LANGUAGE PATHOLOGY LSVT LOUD TREATMENT   Patient Name: Kendra [name] MRN: [id number] DOB:12/28/1945, 76 y.o., female Today's Date: 10/07/2022  PCP: Lavon Paganini, MD REFERRING PROVIDER: Tally Joe, FNP   End of Session - 10/07/22 1046     Visit Number 4    Number of Visits 17    Date for SLP Re-Evaluation 11/28/22    SLP Start Time 1000    SLP Stop Time  1100    SLP Time Calculation (min) 60 min    Activity Tolerance Patient tolerated treatment well             Past Medical History:  Diagnosis Date   Anxiety    Breast cancer ([place]) 03/2018   right breast cancer at 11:00 and 1:00   Bursitis    bilateral hips and knees   Complication of anesthesia    Depression    Family history of breast cancer 03/24/2018   Family history of lung cancer 03/24/2018   History of alcohol dependence ([place]) 2007   no ETOH; resolved since 2007   History of hiatal hernia    History of psychosis 2014   due to sleep disturbance   Hypertension    Parkinson disease    Personal history of radiation therapy 06/2018-07/2018   right breast ca   PONV (postoperative nausea and vomiting)    Past Surgical History:  Procedure Laterality Date   BREAST BIOPSY Right 03/14/2018   11:00 DCIS and invasive ductal carcinoma   BREAST BIOPSY Right 03/14/2018   1:00 Invasive ductal carcinoma   BREAST LUMPECTOMY Right 04/27/2018   lumpectomy of 11 and 1:00 cancers, clear margins, negative LN   BREAST LUMPECTOMY WITH RADIOACTIVE SEED AND SENTINEL LYMPH NODE BIOPSY Right 04/27/2018   Procedure: RIGHT BREAST LUMPECTOMY WITH RADIOACTIVE SEED X 2 AND RIGHT SENTINEL LYMPH NODE BIOPSY;  Surgeon: [name], MD;  Location: [facility];  Service: General;  Laterality: Right;   CATARACT EXTRACTION W/PHACO Left 08/30/2019   Procedure: CATARACT EXTRACTION PHACO AND INTRAOCULAR LENS PLACEMENT ([place]) LEFT panoptix toric  01:20.5  12.7%  10.26;  Surgeon: Leandrew Koyanagi, MD;   Location: [facility];  Service: Ophthalmology;  Laterality: Left;   CATARACT EXTRACTION W/PHACO Right 09/20/2019   Procedure: CATARACT EXTRACTION PHACO AND INTRAOCULAR LENS PLACEMENT ([facility]) RIGHT 5.71  00:36.4  15.7%;  Surgeon: Leandrew Koyanagi, MD;  Location: [facility];  Service: Ophthalmology;  Laterality: Right;   LAPAROTOMY N/A 02/10/2018   Procedure: EXPLORATORY LAPAROTOMY;  Surgeon: Isabel Caprice, MD;  [facility];  Service: Gynecology;  Laterality: N/A;   MASS EXCISION  01/2018   abdominal   OVARIAN CYST REMOVAL     SALPINGOOPHORECTOMY Bilateral 02/10/2018   Procedure: BILATERAL SALPINGO OOPHORECTOMY; PERITONEAL WASHINGS;  Surgeon: Isabel Caprice, MD;  [facility];  Service: Gynecology;  Laterality: Bilateral;   TONSILLECTOMY     TONSILLECTOMY AND ADENOIDECTOMY  1953   Patient Active Problem List   Diagnosis Date Noted   Blepharitis of upper and lower eyelids of both eyes 08/17/2022   Subacute cough 08/17/2022   History of allergy to shellfish 02/24/2022   Chronic low back pain of over 3 months duration 02/04/2022   Osteoarthritis of  AC (acromioclavicular) joint (Left) 02/04/2022   Osteoarthritis of hip (Right) 02/04/2022   Impaction fracture of distal end of radius and ulna, sequela (Left) 02/04/2022   Osteoarthritis of 1st (thumb) carpometacarpal (CMC) joint of hand (Left) 02/04/2022   Lumbosacral facet arthropathy (Multilevel) (Bilateral) (T12-S1)  02/04/2022   Lumbar central spinal stenosis w/o neurogenic claudication (L2-3, L3-4, L4-5) 02/04/2022   Lumbar foraminal stenosis (Bilateral: L2-3, L4-5) (Right: (Severe) L4-5) 02/04/2022   Lumbar lateral recess stenosis (Bilateral: L2-3, L3-4, and L4-5) (Severe) 02/04/2022   DDD (degenerative disc disease), lumbar 02/04/2022   Lumbosacral facet syndrome (Multilevel) (Bilateral) 02/04/2022   Osteoarthritis of lumbar spine 02/04/2022   Erythema ab igne 12/23/2021   Allergic conjunctivitis of both  eyes 12/23/2021   Chronic hip pain (3ry area of Pain) (Bilateral) (L>R) 12/22/2021   Chronic lower extremity pain (2ry area of Pain) (Bilateral) (L>R) 12/22/2021   Abnormal MRI, lumbar spine (01/31/2020) 12/22/2021   Grade 1 Anterolisthesis of lumbar spine (L3/L4) & (5 mm) (L4/L5) 12/22/2021   Chronic pain syndrome 11/17/2021   High risk medication use 11/17/2021   Disorder of skeletal system 11/17/2021   Problems influencing health status 11/17/2021   Other hyperlipidemia 10/21/2021   Polypharmacy 08/12/2021   Chronic low back pain (1ry area of Pain) (Bilateral) (L>R) w/o sciatica 06/18/2021   Elevated blood pressure reading 05/23/2021   Osteopenia of neck of femur (Right) 09/06/2020   Obesity 08/29/2020   Parkinson disease 02/22/2020   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   Bipolar disorder, in full remission, most recent episode depressed ([place]) 11/08/2019   Vitamin D insufficiency 08/24/2019   Insomnia due to mental condition 05/31/2019   Bipolar I disorder, most recent episode depressed ([place]) 05/31/2019   Alcohol use disorder, moderate, in sustained remission ([place]) 05/31/2019   Elevated tumor markers 05/27/2019   GAD (generalized anxiety disorder) 04/10/2019   MDD (major depressive disorder), severe ([place]) 01/31/2019   High serum carbohydrate antigen 19-9 (CA19-9) 12/01/2018   Adjustment disorder with anxious mood 11/24/2018   Essential hypertension 11/24/2018   B12 deficiency 10/17/2018   Incisional hernia, without obstruction or gangrene 09/12/2018   Genetic testing 04/07/2018   Family history of breast cancer 03/24/2018   Family history of lung cancer 03/24/2018   Malignant neoplasm of upper-outer quadrant of right breast in female, estrogen receptor positive ([place]) 03/17/2018   Malignant neoplasm of upper-inner quadrant of right breast in female, estrogen receptor positive ([place]) 03/17/2018   History of psychosis 02/07/2018   History of insomnia 02/07/2018    Onset date: PD dx  2021  REFERRING DIAG: Parkinson's disease  THERAPY DIAG:  Dysarthria and anarthria  Rationale for Evaluation and Treatment Rehabilitation  SUBJECTIVE:   SUBJECTIVE STATEMENT: "I shouted 'I love you' at [place] last night."  Pt accompanied by:  self  PERTINENT HISTORY: Patient is a 76 y.o. female with history of Parkinson's disease (dx 2021), anxiety, depression, psychosis, HTN, bipolar disorder, alcoholism, memory issues since 2005, hx head trauma. Referred for LSVT BIG and LOUD; known to our clinic from prior course of therapy completed in 2022.  DIAGNOSTIC FINDINGS: MRI Brain 08/08/2018: "1. No acute intracranial abnormality and largely unremarkable for age gray and white matter signal. 2. NeuroQuant volumetric analysis of the brain suggests age advanced brain volume loss, see details on [facility] PACS."  PAIN:  Are you having pain? Yes: NPRS scale: 8/10 Pain location: left hip   PATIENT GOALS communicate better  OBJECTIVE:   TODAY'S TREATMENT: LSVT Daily tasks with occasional moderate cues. Loud /a/ averaged 82 dB, duration 14.1 seconds, pitch avg 233 Hz.  Max F0 for pitch glides was 297 Hz (83 dB avg), min F0 for low glides was 156 Hz (82 dB avg). Functional phrases averaged 74 dB with occasional mod cues. Hierarchical loudness drills at word/phrase level averaged  72 dB, with occasional mod cues for effort, loudness. Pt had fatigue in last 5 minutes of session, when avg loudness dipped to 70 dB despite cuing. Spontaneous responses averaged 65 dB, however pt attempted self-correction on several occasions.    PATIENT EDUCATION: Education details: Home practice routine and tracking Person educated: Patient and Spouse Education method: Explanation and Verbal cues Education comprehension: verbalized understanding   HOME EXERCISE PROGRAM: To be provided next session     GOALS: Goals reviewed with patient? Yes  SHORT TERM GOALS: Target date: 10 sessions  The patient will  complete Daily Tasks (Maximum duration "ah", High/Lows, and Functional Phrases) at average loudness >/= 85 dB and with loud, good quality voice with min cues.  Baseline: Goal status: INITIAL  2.  The patient will complete Hierarchal Speech Loudness reading drills (words/phrases, sentences) at average >/= 75 dB and with loud, good quality voice with min cues. Baseline:  Goal status: INITIAL  3.  The patient will participate in 5-8 minutes conversation, maintaining average loudness of 75 dB and loud, good quality voice with min cues. Baseline:  Goal status: INITIAL   LONG TERM GOALS: Target date:   The patient will complete Daily Tasks (Maximum duration "ah", High/Lows, and Functional Phrases) at average loudness >/= 85 dB and with loud, good quality voice Baseline:  Goal status: INITIAL  2.  The patient will complete Hierarchal Speech Loudness reading drills (words/phrases, sentences, and paragraph) at average >/= 75 dB and with loud, good quality voice. Baseline:  Goal status: INITIAL  3.  The patient will participate in 15-20 minutes conversation, maintaining average loudness of >/= 75 dB and loud, good quality voice. Baseline:  Goal status: INITIAL  4.  Patient will report improved communication effectiveness as measured by Communicative Effectiveness Survey.  Baseline: 18/32 on 09/29/22 Goal status: INITIAL   ASSESSMENT:  CLINICAL IMPRESSION: Patient is a 76 y.o. female who presents with mild-moderate dysarthria characterized by reduced vocal intensity, reduced pitch variability, hoarse vocal quality, and subjectively low pitch for gender. Patient and spouse report noting changes in her vocal quality and ability to project her voice. She was stimulable for improvements in intensity, vocal quality, and pitch range with clinician model and cues for "Loud like me." I recommend course of skilled ST for LSVT-LOUD to improve vocal quality and intensity for communicating with family  and friends.   OBJECTIVE IMPAIRMENTS include dysarthria and voice disorder. These impairments are limiting patient from effectively communicating at home and in community. Factors affecting potential to achieve goals and functional outcome are ability to learn/carryover information and previous level of function.. Patient will benefit from skilled SLP services to address above impairments and improve overall function.  REHAB POTENTIAL: Good  PLAN: SLP FREQUENCY: 4x/week  SLP DURATION: 4 weeks  PLANNED INTERVENTIONS: Cueing hierachy, Functional tasks, SLP instruction and feedback, Compensatory strategies, Patient/family education, and LSVT-LOUD      Deneise Lever, MS, Actor [phone]

## 2022-10-07 NOTE — Telephone Encounter (Signed)
Requested Prescriptions  Pending Prescriptions Disp Refills   lisinopril-hydrochlorothiazide (ZESTORETIC) 20-12.5 MG tablet [Pharmacy Med Name: LISINOPRIL-HCTZ 20-12.5 MG TAB] 90 tablet 0    Sig: TAKE 1 TABLET BY MOUTH DAILY     Cardiovascular:  ACEI + Diuretic Combos Passed - 10/07/2022  6:21 AM      Passed - Na in normal range and within 180 days    Sodium  Date Value Ref Range Status  08/28/2022 137 135 - 145 mmol/L Final  02/22/2020 139 134 - 144 mmol/L Final         Passed - K in normal range and within 180 days    Potassium  Date Value Ref Range Status  08/28/2022 3.5 3.5 - 5.1 mmol/L Final         Passed - Cr in normal range and within 180 days    Creatinine  Date Value Ref Range Status  03/23/2018 0.89 0.60 - 1.10 mg/dL Final   Creatinine, Ser  Date Value Ref Range Status  08/28/2022 0.83 0.44 - 1.00 mg/dL Final         Passed - eGFR is 30 or above and within 180 days    GFR, Est AFR Am  Date Value Ref Range Status  03/23/2018 >60 >60 mL/min Final    Comment:    (NOTE) The eGFR has been calculated using the CKD EPI equation. This calculation has not been validated in all clinical situations. eGFR's persistently <60 mL/min signify possible Chronic Kidney Disease.    GFR calc Af Amer  Date Value Ref Range Status  04/29/2020 >60 >60 mL/min Final   GFR, Estimated  Date Value Ref Range Status  08/28/2022 >60 >60 mL/min Final    Comment:    (NOTE) Calculated using the CKD-EPI Creatinine Equation (2021)   03/23/2018 >60 >60 mL/min Final         Passed - Patient is not pregnant      Passed - Last BP in normal range    BP Readings from Last 1 Encounters:  09/10/22 115/68         Passed - Valid encounter within last 6 months    Recent Outpatient Visits           3 weeks ago Urinary tract infection without hematuria, site unspecified   [facility], [place], PA-C   1 month ago Urinary tract infection without hematuria, site  unspecified   [employer], Jake Church, DO   1 month ago [place], [place], DO   5 months ago Essential hypertension   [facility] [place], Dionne Bucy, MD   9 months ago Chronic bilateral low back pain, unspecified whether sciatica present   [facility], Dionne Bucy, MD       Future Appointments             In 1 week [place], Dionne Bucy, MD [facility], [place]

## 2022-10-08 ENCOUNTER — Ambulatory Visit: Payer: Medicare PPO | Admitting: Speech Pathology

## 2022-10-08 ENCOUNTER — Ambulatory Visit: Payer: Medicare PPO

## 2022-10-08 DIAGNOSIS — M25552 Pain in left hip: Secondary | ICD-10-CM | POA: Diagnosis not present

## 2022-10-08 DIAGNOSIS — M79604 Pain in right leg: Secondary | ICD-10-CM | POA: Diagnosis not present

## 2022-10-08 DIAGNOSIS — M79605 Pain in left leg: Secondary | ICD-10-CM | POA: Diagnosis not present

## 2022-10-08 DIAGNOSIS — R278 Other lack of coordination: Secondary | ICD-10-CM

## 2022-10-08 DIAGNOSIS — R2681 Unsteadiness on feet: Secondary | ICD-10-CM | POA: Diagnosis not present

## 2022-10-08 DIAGNOSIS — M6281 Muscle weakness (generalized): Secondary | ICD-10-CM | POA: Diagnosis not present

## 2022-10-08 DIAGNOSIS — R262 Difficulty in walking, not elsewhere classified: Secondary | ICD-10-CM

## 2022-10-08 DIAGNOSIS — R41841 Cognitive communication deficit: Secondary | ICD-10-CM | POA: Diagnosis not present

## 2022-10-08 DIAGNOSIS — R471 Dysarthria and anarthria: Secondary | ICD-10-CM

## 2022-10-08 NOTE — Therapy (Signed)
OUTPATIENT SPEECH LANGUAGE PATHOLOGY LSVT LOUD TREATMENT   Patient Name: Kendra [name] MRN: [id number] DOB:July 22, 1946, 76 y.o., female Today's Date: 10/08/2022  PCP: Lavon Paganini, MD REFERRING PROVIDER: Tally Joe, FNP   End of Session - 10/08/22 1255     Visit Number 5    Number of Visits 17    Date for SLP Re-Evaluation 11/28/22    SLP Start Time 69    SLP Stop Time  1200    SLP Time Calculation (min) 60 min    Activity Tolerance Patient tolerated treatment well             Past Medical History:  Diagnosis Date   Anxiety    Breast cancer ([place]) 03/2018   right breast cancer at 11:00 and 1:00   Bursitis    bilateral hips and knees   Complication of anesthesia    Depression    Family history of breast cancer 03/24/2018   Family history of lung cancer 03/24/2018   History of alcohol dependence ([place]) 2007   no ETOH; resolved since 2007   History of hiatal hernia    History of psychosis 2014   due to sleep disturbance   Hypertension    Parkinson disease    Personal history of radiation therapy 06/2018-07/2018   right breast ca   PONV (postoperative nausea and vomiting)    Past Surgical History:  Procedure Laterality Date   BREAST BIOPSY Right 03/14/2018   11:00 DCIS and invasive ductal carcinoma   BREAST BIOPSY Right 03/14/2018   1:00 Invasive ductal carcinoma   BREAST LUMPECTOMY Right 04/27/2018   lumpectomy of 11 and 1:00 cancers, clear margins, negative LN   BREAST LUMPECTOMY WITH RADIOACTIVE SEED AND SENTINEL LYMPH NODE BIOPSY Right 04/27/2018   Procedure: RIGHT BREAST LUMPECTOMY WITH RADIOACTIVE SEED X 2 AND RIGHT SENTINEL LYMPH NODE BIOPSY;  Surgeon: [name], MD;  Location: [facility];  Service: General;  Laterality: Right;   CATARACT EXTRACTION W/PHACO Left 08/30/2019   Procedure: CATARACT EXTRACTION PHACO AND INTRAOCULAR LENS PLACEMENT ([place]) LEFT panoptix toric  01:20.5  12.7%  10.26;  Surgeon: Leandrew Koyanagi, MD;   Location: [facility];  Service: Ophthalmology;  Laterality: Left;   CATARACT EXTRACTION W/PHACO Right 09/20/2019   Procedure: CATARACT EXTRACTION PHACO AND INTRAOCULAR LENS PLACEMENT ([facility]) RIGHT 5.71  00:36.4  15.7%;  Surgeon: Leandrew Koyanagi, MD;  Location: [facility];  Service: Ophthalmology;  Laterality: Right;   LAPAROTOMY N/A 02/10/2018   Procedure: EXPLORATORY LAPAROTOMY;  Surgeon: Isabel Caprice, MD;  [facility];  Service: Gynecology;  Laterality: N/A;   MASS EXCISION  01/2018   abdominal   OVARIAN CYST REMOVAL     SALPINGOOPHORECTOMY Bilateral 02/10/2018   Procedure: BILATERAL SALPINGO OOPHORECTOMY; PERITONEAL WASHINGS;  Surgeon: Isabel Caprice, MD;  [facility];  Service: Gynecology;  Laterality: Bilateral;   TONSILLECTOMY     TONSILLECTOMY AND ADENOIDECTOMY  1953   Patient Active Problem List   Diagnosis Date Noted   Blepharitis of upper and lower eyelids of both eyes 08/17/2022   Subacute cough 08/17/2022   History of allergy to shellfish 02/24/2022   Chronic low back pain of over 3 months duration 02/04/2022   Osteoarthritis of  AC (acromioclavicular) joint (Left) 02/04/2022   Osteoarthritis of hip (Right) 02/04/2022   Impaction fracture of distal end of radius and ulna, sequela (Left) 02/04/2022   Osteoarthritis of 1st (thumb) carpometacarpal (CMC) joint of hand (Left) 02/04/2022   Lumbosacral facet arthropathy (Multilevel) (Bilateral) (T12-S1)  02/04/2022   Lumbar central spinal stenosis w/o neurogenic claudication (L2-3, L3-4, L4-5) 02/04/2022   Lumbar foraminal stenosis (Bilateral: L2-3, L4-5) (Right: (Severe) L4-5) 02/04/2022   Lumbar lateral recess stenosis (Bilateral: L2-3, L3-4, and L4-5) (Severe) 02/04/2022   DDD (degenerative disc disease), lumbar 02/04/2022   Lumbosacral facet syndrome (Multilevel) (Bilateral) 02/04/2022   Osteoarthritis of lumbar spine 02/04/2022   Erythema ab igne 12/23/2021   Allergic conjunctivitis of both  eyes 12/23/2021   Chronic hip pain (3ry area of Pain) (Bilateral) (L>R) 12/22/2021   Chronic lower extremity pain (2ry area of Pain) (Bilateral) (L>R) 12/22/2021   Abnormal MRI, lumbar spine (01/31/2020) 12/22/2021   Grade 1 Anterolisthesis of lumbar spine (L3/L4) & (5 mm) (L4/L5) 12/22/2021   Chronic pain syndrome 11/17/2021   High risk medication use 11/17/2021   Disorder of skeletal system 11/17/2021   Problems influencing health status 11/17/2021   Other hyperlipidemia 10/21/2021   Polypharmacy 08/12/2021   Chronic low back pain (1ry area of Pain) (Bilateral) (L>R) w/o sciatica 06/18/2021   Elevated blood pressure reading 05/23/2021   Osteopenia of neck of femur (Right) 09/06/2020   Obesity 08/29/2020   Parkinson disease 02/22/2020   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   Bipolar disorder, in full remission, most recent episode depressed ([place]) 11/08/2019   Vitamin D insufficiency 08/24/2019   Insomnia due to mental condition 05/31/2019   Bipolar I disorder, most recent episode depressed ([place]) 05/31/2019   Alcohol use disorder, moderate, in sustained remission ([place]) 05/31/2019   Elevated tumor markers 05/27/2019   GAD (generalized anxiety disorder) 04/10/2019   MDD (major depressive disorder), severe ([place]) 01/31/2019   High serum carbohydrate antigen 19-9 (CA19-9) 12/01/2018   Adjustment disorder with anxious mood 11/24/2018   Essential hypertension 11/24/2018   B12 deficiency 10/17/2018   Incisional hernia, without obstruction or gangrene 09/12/2018   Genetic testing 04/07/2018   Family history of breast cancer 03/24/2018   Family history of lung cancer 03/24/2018   Malignant neoplasm of upper-outer quadrant of right breast in female, estrogen receptor positive ([place]) 03/17/2018   Malignant neoplasm of upper-inner quadrant of right breast in female, estrogen receptor positive ([place]) 03/17/2018   History of psychosis 02/07/2018   History of insomnia 02/07/2018    Onset date: PD dx  2021  REFERRING DIAG: Parkinson's disease  THERAPY DIAG:  Dysarthria and anarthria  Rationale for Evaluation and Treatment Rehabilitation  SUBJECTIVE:   SUBJECTIVE STATEMENT: "I'm supposed to do twice" (practice over the weekend).  Pt accompanied by:  self  PERTINENT HISTORY: Patient is a 76 y.o. female with history of Parkinson's disease (dx 2021), anxiety, depression, psychosis, HTN, bipolar disorder, alcoholism, memory issues since 2005, hx head trauma. Referred for LSVT BIG and LOUD; known to our clinic from prior course of therapy completed in 2022.  DIAGNOSTIC FINDINGS: MRI Brain 08/08/2018: "1. No acute intracranial abnormality and largely unremarkable for age gray and white matter signal. 2. NeuroQuant volumetric analysis of the brain suggests age advanced brain volume loss, see details on [facility] PACS."  PAIN:  Are you having pain? Yes: NPRS scale: 8/10 Pain location: left hip   PATIENT GOALS communicate better  OBJECTIVE:   TODAY'S TREATMENT: Pt reports she did not complete practice yesterday. LSVT Daily tasks with occasional min-mod cues. Loud /a/ averaged 83 dB, duration 12.5 seconds, pitch avg '223Hz'$ .  Max F0 for pitch glides was 294 Hz (83 dB avg), min F0 for low glides was 156 Hz (82 dB avg). Functional phrases averaged 75 dB with occasional min cues.  Hierarchical loudness drills at word/phrase level averaged 72 dB with min cues, progressed to longer phrases, short sentences average 70 dB. Patient continues to increase efforts at self-correction.    PATIENT EDUCATION: Education details: Practice over the weekend is important to help maintain gains she has made this week. Person educated: Patient and Spouse Education method: Explanation and Verbal cues Education comprehension: verbalized understanding   HOME EXERCISE PROGRAM: To be provided next session     GOALS: Goals reviewed with patient? Yes  SHORT TERM GOALS: Target date: 10 sessions  The patient  will complete Daily Tasks (Maximum duration "ah", High/Lows, and Functional Phrases) at average loudness >/= 85 dB and with loud, good quality voice with min cues.  Baseline: Goal status: INITIAL  2.  The patient will complete Hierarchal Speech Loudness reading drills (words/phrases, sentences) at average >/= 75 dB and with loud, good quality voice with min cues. Baseline:  Goal status: INITIAL  3.  The patient will participate in 5-8 minutes conversation, maintaining average loudness of 75 dB and loud, good quality voice with min cues. Baseline:  Goal status: INITIAL   LONG TERM GOALS: Target date:   The patient will complete Daily Tasks (Maximum duration "ah", High/Lows, and Functional Phrases) at average loudness >/= 85 dB and with loud, good quality voice Baseline:  Goal status: INITIAL  2.  The patient will complete Hierarchal Speech Loudness reading drills (words/phrases, sentences, and paragraph) at average >/= 75 dB and with loud, good quality voice. Baseline:  Goal status: INITIAL  3.  The patient will participate in 15-20 minutes conversation, maintaining average loudness of >/= 75 dB and loud, good quality voice. Baseline:  Goal status: INITIAL  4.  Patient will report improved communication effectiveness as measured by Communicative Effectiveness Survey.  Baseline: 18/32 on 09/29/22 Goal status: INITIAL   ASSESSMENT:  CLINICAL IMPRESSION: Patient is a 76 y.o. female who presents with mild-moderate dysarthria characterized by reduced vocal intensity, reduced pitch variability, hoarse vocal quality, and subjectively low pitch for gender. Patient is demonstrating improved awareness of low-intensity speech, and is initiating attempts to increase volume at times, however cues remain necessary for consistent awareness. She remains stimulable for improvements in intensity, vocal quality, and pitch range with clinician model and cues for "Loud like me," and is using this with  min cues in phrase level structured tasks. I recommend course of skilled ST for LSVT-LOUD to improve vocal quality and intensity for communicating with family and friends.   OBJECTIVE IMPAIRMENTS include dysarthria and voice disorder. These impairments are limiting patient from effectively communicating at home and in community. Factors affecting potential to achieve goals and functional outcome are ability to learn/carryover information and previous level of function.. Patient will benefit from skilled SLP services to address above impairments and improve overall function.  REHAB POTENTIAL: Good  PLAN: SLP FREQUENCY: 4x/week  SLP DURATION: 4 weeks  PLANNED INTERVENTIONS: Cueing hierachy, Functional tasks, SLP instruction and feedback, Compensatory strategies, Patient/family education, and LSVT-LOUD      Deneise Lever, MS, Actor [phone]

## 2022-10-08 NOTE — Therapy (Signed)
OUTPATIENT PHYSICAL THERAPY LSVT BIG TREATMENT   Patient Name: Kendra [name] MRN: [id number] DOB:02/04/1946, 76 y.o., female Today's Date: 10/08/2022  PCP: Virginia Crews, MD REFERRING PROVIDER: Virginia Crews, MD  END OF SESSION:  PT End of Session - 10/08/22 1510     Visit Number 5    Number of Visits 17    Date for PT Re-Evaluation 11/10/22    PT Start Time 6295    PT Stop Time 1058    PT Time Calculation (min) 56 min    Equipment Utilized During Treatment Gait belt    Activity Tolerance Patient tolerated treatment well;Patient limited by fatigue;No increased pain    Behavior During Therapy [facility] for tasks assessed/performed              Past Medical History:  Diagnosis Date   Anxiety    Breast cancer ([place]) 03/2018   right breast cancer at 11:00 and 1:00   Bursitis    bilateral hips and knees   Complication of anesthesia    Depression    Family history of breast cancer 03/24/2018   Family history of lung cancer 03/24/2018   History of alcohol dependence ([place]) 2007   no ETOH; resolved since 2007   History of hiatal hernia    History of psychosis 2014   due to sleep disturbance   Hypertension    Parkinson disease    Personal history of radiation therapy 06/2018-07/2018   right breast ca   PONV (postoperative nausea and vomiting)    Past Surgical History:  Procedure Laterality Date   BREAST BIOPSY Right 03/14/2018   11:00 DCIS and invasive ductal carcinoma   BREAST BIOPSY Right 03/14/2018   1:00 Invasive ductal carcinoma   BREAST LUMPECTOMY Right 04/27/2018   lumpectomy of 11 and 1:00 cancers, clear margins, negative LN   BREAST LUMPECTOMY WITH RADIOACTIVE SEED AND SENTINEL LYMPH NODE BIOPSY Right 04/27/2018   Procedure: RIGHT BREAST LUMPECTOMY WITH RADIOACTIVE SEED X 2 AND RIGHT SENTINEL LYMPH NODE BIOPSY;  Surgeon: [name], MD;  Location: [place];  Service: General;  Laterality: Right;   CATARACT EXTRACTION W/PHACO  Left 08/30/2019   Procedure: CATARACT EXTRACTION PHACO AND INTRAOCULAR LENS PLACEMENT ([place]) LEFT panoptix toric  01:20.5  12.7%  10.26;  Surgeon: Leandrew Koyanagi, MD;  Location: [place];  Service: Ophthalmology;  Laterality: Left;   CATARACT EXTRACTION W/PHACO Right 09/20/2019   Procedure: CATARACT EXTRACTION PHACO AND INTRAOCULAR LENS PLACEMENT ([facility]) RIGHT 5.71  00:36.4  15.7%;  Surgeon: Leandrew Koyanagi, MD;  Location: [place];  Service: Ophthalmology;  Laterality: Right;   LAPAROTOMY N/A 02/10/2018   Procedure: EXPLORATORY LAPAROTOMY;  Surgeon: Isabel Caprice, MD;  Location: [facility];  Service: Gynecology;  Laterality: N/A;   MASS EXCISION  01/2018   abdominal   OVARIAN CYST REMOVAL     SALPINGOOPHORECTOMY Bilateral 02/10/2018   Procedure: BILATERAL SALPINGO OOPHORECTOMY; PERITONEAL WASHINGS;  Surgeon: Isabel Caprice, MD;  Location: [facility];  Service: Gynecology;  Laterality: Bilateral;   TONSILLECTOMY     TONSILLECTOMY AND ADENOIDECTOMY  1953   Patient Active Problem List   Diagnosis Date Noted   Blepharitis of upper and lower eyelids of both eyes 08/17/2022   Subacute cough 08/17/2022   History of allergy to shellfish 02/24/2022   Chronic low back pain of over 3 months duration 02/04/2022   Osteoarthritis of  AC (acromioclavicular) joint (Left) 02/04/2022   Osteoarthritis of hip (Right) 02/04/2022   Impaction fracture of distal  end of radius and ulna, sequela (Left) 02/04/2022   Osteoarthritis of 1st (thumb) carpometacarpal [facility]) joint of hand (Left) 02/04/2022   Lumbosacral facet arthropathy (Multilevel) (Bilateral) (T12-S1) 02/04/2022   Lumbar central spinal stenosis w/o neurogenic claudication (L2-3, L3-4, L4-5) 02/04/2022   Lumbar foraminal stenosis (Bilateral: L2-3, L4-5) (Right: (Severe) L4-5) 02/04/2022   Lumbar lateral recess stenosis (Bilateral: L2-3, L3-4, and L4-5) (Severe) 02/04/2022   DDD (degenerative disc disease), lumbar 02/04/2022    Lumbosacral facet syndrome (Multilevel) (Bilateral) 02/04/2022   Osteoarthritis of lumbar spine 02/04/2022   Erythema ab igne 12/23/2021   Allergic conjunctivitis of both eyes 12/23/2021   Chronic hip pain (3ry area of Pain) (Bilateral) (L>R) 12/22/2021   Chronic lower extremity pain (2ry area of Pain) (Bilateral) (L>R) 12/22/2021   Abnormal MRI, lumbar spine (01/31/2020) 12/22/2021   Grade 1 Anterolisthesis of lumbar spine (L3/L4) & (5 mm) (L4/L5) 12/22/2021   Chronic pain syndrome 11/17/2021   High risk medication use 11/17/2021   Disorder of skeletal system 11/17/2021   Problems influencing health status 11/17/2021   Other hyperlipidemia 10/21/2021   Polypharmacy 08/12/2021   Chronic low back pain (1ry area of Pain) (Bilateral) (L>R) w/o sciatica 06/18/2021   Elevated blood pressure reading 05/23/2021   Osteopenia of neck of femur (Right) 09/06/2020   Obesity 08/29/2020   Parkinson disease 02/22/2020   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   Bipolar disorder, in full remission, most recent episode depressed ([place]) 11/08/2019   Vitamin D insufficiency 08/24/2019   Insomnia due to mental condition 05/31/2019   Bipolar I disorder, most recent episode depressed ([place]) 05/31/2019   Alcohol use disorder, moderate, in sustained remission ([place]) 05/31/2019   Elevated tumor markers 05/27/2019   GAD (generalized anxiety disorder) 04/10/2019   MDD (major depressive disorder), severe ([place]) 01/31/2019   High serum carbohydrate antigen 19-9 (CA19-9) 12/01/2018   Adjustment disorder with anxious mood 11/24/2018   Essential hypertension 11/24/2018   B12 deficiency 10/17/2018   Incisional hernia, without obstruction or gangrene 09/12/2018   Genetic testing 04/07/2018   Family history of breast cancer 03/24/2018   Family history of lung cancer 03/24/2018   Malignant neoplasm of upper-outer quadrant of right breast in female, estrogen receptor positive ([place]) 03/17/2018   Malignant neoplasm of upper-inner  quadrant of right breast in female, estrogen receptor positive ([place]) 03/17/2018   History of psychosis 02/07/2018   History of insomnia 02/07/2018    ONSET DATE: Pt diagnosed with PD in 2019  REFERRING DIAG:  G20 (ICD-10-CM) - Parkinson's disease      THERAPY DIAG:  Unsteadiness on feet  Muscle weakness (generalized)  Other lack of coordination  Difficulty in walking, not elsewhere classified  Pain in left hip  Rationale for Evaluation and Treatment: Rehabilitation  SUBJECTIVE:  SUBJECTIVE STATEMENT: Pt reports L hip pain currently, rates 7/10. Pt reports performing part of her HEP yesterday and says it was difficult.  Pain: 7/10 in L hip (chronic) with some radiating pain into LE -not specified which  Pt accompanied by: significant other self  PERTINENT HISTORY:  Pt is a pleasant 76 yo female who was diagnosed with PD in 2019. She is known to clinic and [facility], and was last seen for LSVT BIG Sept-Oct 2022. Pt with chronic pain and has become increasingly sedentary as a result exacerbating mobility difficulties due to PD. Current concerns: Pt reports L and R hip pain and back pain. Pt was being seen by pain management clinic. Pt has had injections that provided relief at the time. Pt's spouse reports her physician determined surgery was not an option for her pain so the plan is to manage it. Pt spouse thinks pain has resulted in reluctance for pt to move. Pt currently sedentary, doesn't walk much, but when she does she holds on to furniture for support due to balance problems. When pt first stands she must hold on to something due to mix of pain and/or dizziness (described as lightheadedness). Her pain can go from 3/10 to 9/10. It does improve once she starts moving. Pt has increased difficulty  with multiple activities including: getting in and out of her bed, getting in and out of her car, freezing, decreased gait speed, decreased balance and cognition, and difficulty using BUE due to tremors.  Per chart other PMH includes hx of breast cancer, HTN, MCI, insomnia, cataracts, bipolar 1, alcohol use disorder, GAD, MDD, osteopenia of neck of R femur, chronic B low back pain with radiating pain to buttock and posterior thighs and calves. Pt with multiple comorbidities, please refer to chart for full details    PAIN: from eval Are you having pain? Yes: NPRS scale: not rated currently but can reach 9/10 Pain location: L hip and L leg Pain description: pt has difficulty describing Aggravating factors: unsure Relieving factors: laying in the recliner helps, movement  PRECAUTIONS: Fall  WEIGHT BEARING RESTRICTIONS: No  FALLS: Has patient fallen in last 6 months? Yes. Number of falls 1x, leg tangled in chair leg when getting up from a chair  LIVING ENVIRONMENT: Lives with: lives with their spouse Lives in: House/apartment Stairs:  Has following equipment at home:  pt not currently using AD, holds onto walls/furniture, does have walker and SPC at home  PLOF: Independent with basic ADLs  PATIENT GOALS: improve mobility  IMAGING: via chart  07/24/22 DG BONE density " ASSESSMENT: The probability of a major osteoporotic fracture is 15.8% within the next ten years.   The probability of a hip fracture is 2.9% within the next ten years."  12/22/21 DG lumbar spine " IMPRESSION: 1. No acute displaced fracture or traumatic listhesis of the lumbar spine. 2. Interval development of grade 1 anterolisthesis of L3 on L4. Stable grade 1 anterolisthesis of L4 on L5. Appears stable on flexion extension views. 3.  Aortic Atherosclerosis (ICD10-I70.0)."  Please refer to chart for full details  OBJECTIVE: taken from eval unless otherwise indicated  LSVT BIG EVALUATION & EXAMINATION    Neurological and Other Medical Information:   Pt known to clinic some answers via chart (2022) What were your initial symptoms of Parkinson's disease? first diagnosed 2019   Do you have tremors?  Yes: R hand   Do you have any pain? Yes: B hip and back, refer to above  Medical Information:    Medication for Parkinson's disease: sinemet   Motor Symptoms:    When did you first start to notice changes in your movement you associate with Parkinson's disease? Recent changes following d/c from previous LSVT and HH - increasingly sedentary due to pain per spouse   What are your current symptoms? Difficulty getting out of a chair, sometimes has difficulty getting in and out of bed, difficult with walking, still dresses herself but is mod I and sometimes must sit to dress, requires assistance with bathing, needs assistance with meal prep (due to balance and difficulty using her hands), has general difficulty with sequencing tasks/activities   What do you do when you want to move the best you possibly can? Pt does not know    Has Parkinson's disease caused you to move less or be less active? Yes: feels most recent contribution is due to pain, freezing  Have you noticed if your movement is slower than it used to be? For example, walking, getting dressed, doing household chores, bathing, etc. Yes: pt spouse reports difficulty maintaining at home exercises following discharge   Have you or others noticed any changes in your posture? Yes: pt notices she doesn't always stand up straight   How many (if any) falls have you had in the last six months? 1x     Have you noticed any freezing with your movement? Yes:     Are there some activities you now need help with because of your Parkinson's disease? See above     Assessment:     MMT: deferred due to time   Donning jacket: 17 seconds Doffing jacket: 7 seconds   Opening jar: 5.7 seconds with multiple attempts   Sit to stand assessment:   5x STS: 27 seconds    Gait assessments:  10 MWT:   0.58 m/s with rollator     Functional Component movements:   Sit to stand (required) Donning jacket (arm through sleeve) Opening jar Stepping over and on to obstacle (simulate getting in car simulation) Big and tall posture (stand BIG)   Hierarchies Getting in and out of bed 2. Ambulating for distance and over uneven surfaces/obstacles with rollator or LRD    PATIENT SURVEYS:  FOTO 40 (goal 49) - filled out by spouse due to pt impaired cognition   PATIENT EDUCATION:  Education details: Pt educated throughout session about proper posture and technique with exercises. Improved exercise technique, movement at target joints, use of target muscles after min to mod verbal, visual, tactile cues. LSVT BIG interventions, technique with rollator  Person educated: Patient and Spouse Education method: Explanation Education comprehension: verbalized understanding   TODAY'S TREATMENT:                                                                                                                                         DATE:    LSVT BIG  Treatment:  Patient seen for LSVT Daily Session Maximal Daily Exercises for facilitation/coordination of movement Sustained movements are designed to rescale the amplitude of movement output for generalization to daily functional activities.  Maximal Adapted Daily Exercises: Floor to Ceiling 8 with 10 second hold Side to Side 8 Bilateral with 10 second hold Forward Step and Reach 10 Bilateral - instructed in hand flicks for LUE  Sideways Step and Reach 10 Bilateral Backwards Step and Reach 10 Bilateral Forwards Rock and Reach 10 Bilateral Sideways Rock and Reach 10 Bilateral  Maximal Daily Exercise Comments: needs modeling for correct positions, VC/TC for big reach, increased amplitude, increased effort >80% of time. Rest breaks taken throughout due to fatigue and for pain modulation ( pt reports no  increase from baseline). Pt's pain and dizziness closely monitored throughout. Pt exhibits carryover from prior sessions, requiring less cuing for abduction of L digits.     Functional Component movements:  5 reps performed of each Sit to stand (required) - requires increased hands-on assist today 2. Donning jacket (arm through sleeve) 3. Opening jar - instructed in use of finger flicks 4. Stepping over and on to obstacle (simulate getting in car simulation) - using 6" step 5. Big and tall posture (stand/sit BIG)   Hierarchies Getting in and out of bed - instruction in rolling, sidelye>sit each side of bed. Pt continues to respond well to cuing with improved technique and reports no increase in LBP/hip pain 2. Ambulating for distance and over uneven surfaces/obstacles without AD, with CGA- incorporated into gait training - pt completes approx 100 ft. Steps over 1 obstacle with BUE support. Requires multiple rest breaks due to swimmy-headedness with some lightheadedness. This improves with rest. See gait training for further details.  Gait - Instructed in use of 4WW for approx 100 ft, including instruction in use of brakes, for quick brake and permanent. Encouraged pt in obtaining 4WW so she is able to ambulate with some support and have a place to sit in case she feels swimmy-headed and needs to rest. Continued focus on cuing for big steps, upright posture and arm-swing B, emphasis LUE   Carryover Assignment: Big steps when entering a room (to be performed throughout remainder of week)    HOME EXERCISE PROGRAM:  Pt to perform all 7 LSVT BIG  (adapted) maximal daily exercises, 5 Functional Component Tasks and big walking and carryover assignment (see above)  GOALS: Goals reviewed with patient? Yes  SHORT TERM GOALS: Target date: 10/27/2022    Patient will be independent in home exercise program to improve strength/mobility for better functional independence with ADLs. Baseline: LSVT  BIG protocol to be initiated next session Goal status: INITIAL   LONG TERM GOALS: Target date: 11/11/2022   Patient will increase FOTO score to equal to or greater than 49 to demonstrate statistically significant improvement in mobility and quality of life.  Baseline: 40 Goal status: INITIAL  2.  Patient (> 51 years old) will complete five times sit to stand test in < 15 seconds indicating decreased fall risk.  Baseline: 27 sec Goal status: INITIAL  3.  Patient will increase 10 meter walk test to >1.18ms with WFL B step-length, upright posture for >90% of test to improve ease with gait and to override hypokentic and bradykinetic movement.  Baseline: 0.58 m/s with 4WW Goal status: INITIAL  4.  Patient will reduce time to don jacket by at least 5 seconds in order to increase ease with dressing and override bradykinetic movement Baseline:  17 seconds Goal status: INITIAL  5.  Pt will demo or report ability to open standard jar using only 1-2 attempts in order to exhibit improved functional mobility for ADLs.  Baseline: 5.7 seconds with multiple attempts to turn jar lid to open Goal status: INITIAL  ASSESSMENT:  CLINICAL IMPRESSION: Pt with carryover from prior sessions AEB self-correction in increasing L hand digit abduction. Pt continues to be limited by fatigue and a swimmy-headed sensation. PT encouraged pt to obtain 4WW as safety measure for this issue. Will continue to monitor and assess vitals. The pt will benefit from further LSVT BIG PT in order to address these deficits to improve overall functional mobility, QOL and to decrease fall risk.  OBJECTIVE IMPAIRMENTS: Abnormal gait, decreased balance, decreased cognition, decreased coordination, decreased endurance, decreased knowledge of use of DME, decreased mobility, difficulty walking, decreased strength, dizziness, hypomobility, impaired UE functional use, improper body mechanics, postural dysfunction, and pain.   ACTIVITY  LIMITATIONS: lifting, bending, standing, squatting, stairs, transfers, bed mobility, bathing, dressing, hygiene/grooming, and locomotion level  PARTICIPATION LIMITATIONS: meal prep, cleaning, laundry, medication management, personal finances, driving, shopping, community activity, and yard work  PERSONAL FACTORS: Age, Fitness, Sex, Time since onset of injury/illness/exacerbation, and 3+ comorbidities: PMH includes hx of breast cancer, HTN, MCI, insomnia, cataracts, bipolar 1, alcohol use disorder, GAD, MDD, osteopenia of neck of R femur, chronic B low back pain with radiating pain to buttock and posterior thighs and calves. Pt with multiple comorbidities, please refer to chart for full details  are also affecting patient's functional outcome.   REHAB POTENTIAL: Fair    CLINICAL DECISION MAKING: Evolving/moderate complexity  EVALUATION COMPLEXITY: High  PLAN:  PT FREQUENCY: 4x/week  PT DURATION: 6 weeks  PLANNED INTERVENTIONS: Therapeutic exercises, Therapeutic activity, Neuromuscular re-education, Balance training, Gait training, Patient/Family education, Self Care, Joint mobilization, Stair training, Vestibular training, Canalith repositioning, Orthotic/Fit training, DME instructions, Wheelchair mobility training, Spinal mobilization, Cryotherapy, Moist heat, Splintting, Taping, Manual therapy, and Re-evaluation  PLAN FOR NEXT SESSION: adapted maximal daily exercises, functional component tasks, hierarchy tasks, big walking   Zollie Pee, PT 10/08/2022, 3:21 PM

## 2022-10-12 ENCOUNTER — Ambulatory Visit: Payer: Medicare PPO | Admitting: Speech Pathology

## 2022-10-12 ENCOUNTER — Ambulatory Visit: Payer: Medicare PPO

## 2022-10-12 DIAGNOSIS — R278 Other lack of coordination: Secondary | ICD-10-CM

## 2022-10-12 DIAGNOSIS — R2681 Unsteadiness on feet: Secondary | ICD-10-CM | POA: Diagnosis not present

## 2022-10-12 DIAGNOSIS — M6281 Muscle weakness (generalized): Secondary | ICD-10-CM

## 2022-10-12 DIAGNOSIS — M79605 Pain in left leg: Secondary | ICD-10-CM

## 2022-10-12 DIAGNOSIS — M79604 Pain in right leg: Secondary | ICD-10-CM | POA: Diagnosis not present

## 2022-10-12 DIAGNOSIS — R41841 Cognitive communication deficit: Secondary | ICD-10-CM | POA: Diagnosis not present

## 2022-10-12 DIAGNOSIS — M25552 Pain in left hip: Secondary | ICD-10-CM

## 2022-10-12 DIAGNOSIS — R262 Difficulty in walking, not elsewhere classified: Secondary | ICD-10-CM | POA: Diagnosis not present

## 2022-10-12 DIAGNOSIS — R471 Dysarthria and anarthria: Secondary | ICD-10-CM | POA: Diagnosis not present

## 2022-10-12 NOTE — Therapy (Signed)
OUTPATIENT PHYSICAL THERAPY LSVT BIG TREATMENT   Patient Name: Kendra [name] MRN: [id number] DOB:1946-08-09, 76 y.o., female Today's Date: 10/12/2022  PCP: Virginia Crews, MD REFERRING PROVIDER: Virginia Crews, MD  END OF SESSION:  PT End of Session - 10/12/22 1155     Visit Number 6    Number of Visits 17    Date for PT Re-Evaluation 11/10/22    PT Start Time 1001    PT Stop Time 1059    PT Time Calculation (min) 58 min    Equipment Utilized During Treatment Gait belt    Activity Tolerance Patient tolerated treatment well;Patient limited by fatigue;Patient limited by pain    Behavior During Therapy [facility] for tasks assessed/performed               Past Medical History:  Diagnosis Date   Anxiety    Breast cancer ([place]) 03/2018   right breast cancer at 11:00 and 1:00   Bursitis    bilateral hips and knees   Complication of anesthesia    Depression    Family history of breast cancer 03/24/2018   Family history of lung cancer 03/24/2018   History of alcohol dependence ([place]) 2007   no ETOH; resolved since 2007   History of hiatal hernia    History of psychosis 2014   due to sleep disturbance   Hypertension    Parkinson disease    Personal history of radiation therapy 06/2018-07/2018   right breast ca   PONV (postoperative nausea and vomiting)    Past Surgical History:  Procedure Laterality Date   BREAST BIOPSY Right 03/14/2018   11:00 DCIS and invasive ductal carcinoma   BREAST BIOPSY Right 03/14/2018   1:00 Invasive ductal carcinoma   BREAST LUMPECTOMY Right 04/27/2018   lumpectomy of 11 and 1:00 cancers, clear margins, negative LN   BREAST LUMPECTOMY WITH RADIOACTIVE SEED AND SENTINEL LYMPH NODE BIOPSY Right 04/27/2018   Procedure: RIGHT BREAST LUMPECTOMY WITH RADIOACTIVE SEED X 2 AND RIGHT SENTINEL LYMPH NODE BIOPSY;  Surgeon: [name], MD;  Location: [facility];  Service: General;  Laterality: Right;   CATARACT EXTRACTION  W/PHACO Left 08/30/2019   Procedure: CATARACT EXTRACTION PHACO AND INTRAOCULAR LENS PLACEMENT ([place]) LEFT panoptix toric  01:20.5  12.7%  10.26;  Surgeon: Leandrew Koyanagi, MD;  Location: [facility];  Service: Ophthalmology;  Laterality: Left;   CATARACT EXTRACTION W/PHACO Right 09/20/2019   Procedure: CATARACT EXTRACTION PHACO AND INTRAOCULAR LENS PLACEMENT ([facility]) RIGHT 5.71  00:36.4  15.7%;  Surgeon: Leandrew Koyanagi, MD;  Location: [facility];  Service: Ophthalmology;  Laterality: Right;   LAPAROTOMY N/A 02/10/2018   Procedure: EXPLORATORY LAPAROTOMY;  Surgeon: Isabel Caprice, MD;  Location: [facility];  Service: Gynecology;  Laterality: N/A;   MASS EXCISION  01/2018   abdominal   OVARIAN CYST REMOVAL     SALPINGOOPHORECTOMY Bilateral 02/10/2018   Procedure: BILATERAL SALPINGO OOPHORECTOMY; PERITONEAL WASHINGS;  Surgeon: Isabel Caprice, MD;  Location: [facility];  Service: Gynecology;  Laterality: Bilateral;   TONSILLECTOMY     TONSILLECTOMY AND ADENOIDECTOMY  1953   Patient Active Problem List   Diagnosis Date Noted   Blepharitis of upper and lower eyelids of both eyes 08/17/2022   Subacute cough 08/17/2022   History of allergy to shellfish 02/24/2022   Chronic low back pain of over 3 months duration 02/04/2022   Osteoarthritis of  AC (acromioclavicular) joint (Left) 02/04/2022   Osteoarthritis of hip (Right) 02/04/2022   Impaction fracture  of distal end of radius and ulna, sequela (Left) 02/04/2022   Osteoarthritis of 1st (thumb) carpometacarpal [facility]) joint of hand (Left) 02/04/2022   Lumbosacral facet arthropathy (Multilevel) (Bilateral) (T12-S1) 02/04/2022   Lumbar central spinal stenosis w/o neurogenic claudication (L2-3, L3-4, L4-5) 02/04/2022   Lumbar foraminal stenosis (Bilateral: L2-3, L4-5) (Right: (Severe) L4-5) 02/04/2022   Lumbar lateral recess stenosis (Bilateral: L2-3, L3-4, and L4-5) (Severe) 02/04/2022   DDD (degenerative disc disease), lumbar  02/04/2022   Lumbosacral facet syndrome (Multilevel) (Bilateral) 02/04/2022   Osteoarthritis of lumbar spine 02/04/2022   Erythema ab igne 12/23/2021   Allergic conjunctivitis of both eyes 12/23/2021   Chronic hip pain (3ry area of Pain) (Bilateral) (L>R) 12/22/2021   Chronic lower extremity pain (2ry area of Pain) (Bilateral) (L>R) 12/22/2021   Abnormal MRI, lumbar spine (01/31/2020) 12/22/2021   Grade 1 Anterolisthesis of lumbar spine (L3/L4) & (5 mm) (L4/L5) 12/22/2021   Chronic pain syndrome 11/17/2021   High risk medication use 11/17/2021   Disorder of skeletal system 11/17/2021   Problems influencing health status 11/17/2021   Other hyperlipidemia 10/21/2021   Polypharmacy 08/12/2021   Chronic low back pain (1ry area of Pain) (Bilateral) (L>R) w/o sciatica 06/18/2021   Elevated blood pressure reading 05/23/2021   Osteopenia of neck of femur (Right) 09/06/2020   Obesity 08/29/2020   Parkinson disease 02/22/2020   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   Bipolar disorder, in full remission, most recent episode depressed ([place]) 11/08/2019   Vitamin D insufficiency 08/24/2019   Insomnia due to mental condition 05/31/2019   Bipolar I disorder, most recent episode depressed ([place]) 05/31/2019   Alcohol use disorder, moderate, in sustained remission ([place]) 05/31/2019   Elevated tumor markers 05/27/2019   GAD (generalized anxiety disorder) 04/10/2019   MDD (major depressive disorder), severe ([place]) 01/31/2019   High serum carbohydrate antigen 19-9 (CA19-9) 12/01/2018   Adjustment disorder with anxious mood 11/24/2018   Essential hypertension 11/24/2018   B12 deficiency 10/17/2018   Incisional hernia, without obstruction or gangrene 09/12/2018   Genetic testing 04/07/2018   Family history of breast cancer 03/24/2018   Family history of lung cancer 03/24/2018   Malignant neoplasm of upper-outer quadrant of right breast in female, estrogen receptor positive ([place]) 03/17/2018   Malignant neoplasm  of upper-inner quadrant of right breast in female, estrogen receptor positive ([place]) 03/17/2018   History of psychosis 02/07/2018   History of insomnia 02/07/2018    ONSET DATE: Pt diagnosed with PD in 2019  REFERRING DIAG:  G20 (ICD-10-CM) - Parkinson's disease      THERAPY DIAG:  Unsteadiness on feet  Other lack of coordination  Muscle weakness (generalized)  Difficulty in walking, not elsewhere classified  Pain in left leg  Pain in left hip  Rationale for Evaluation and Treatment: Rehabilitation  SUBJECTIVE:  SUBJECTIVE STATEMENT: Pt reports doing some but not much of her HEP over the weekend. Reports she had some trouble with it. No other updates/concerns reported.  Pain: 10/10 in L hip (chronic) with some radiating pain into LE -not specified which. When asked if pt needs emergency care due to high pain rating, she reports it is not bad enough to stop PT or go seek further care. She says she is not sure how else to rate her pain.  Pt accompanied by: significant other self  PERTINENT HISTORY:  Pt is a pleasant 77 yo female who was diagnosed with PD in 2019. She is known to clinic and [facility], and was last seen for LSVT BIG Sept-Oct 2022. Pt with chronic pain and has become increasingly sedentary as a result exacerbating mobility difficulties due to PD. Current concerns: Pt reports L and R hip pain and back pain. Pt was being seen by pain management clinic. Pt has had injections that provided relief at the time. Pt's spouse reports her physician determined surgery was not an option for her pain so the plan is to manage it. Pt spouse thinks pain has resulted in reluctance for pt to move. Pt currently sedentary, doesn't walk much, but when she does she holds on to furniture for support due to  balance problems. When pt first stands she must hold on to something due to mix of pain and/or dizziness (described as lightheadedness). Her pain can go from 3/10 to 9/10. It does improve once she starts moving. Pt has increased difficulty with multiple activities including: getting in and out of her bed, getting in and out of her car, freezing, decreased gait speed, decreased balance and cognition, and difficulty using BUE due to tremors.  Per chart other PMH includes hx of breast cancer, HTN, MCI, insomnia, cataracts, bipolar 1, alcohol use disorder, GAD, MDD, osteopenia of neck of R femur, chronic B low back pain with radiating pain to buttock and posterior thighs and calves. Pt with multiple comorbidities, please refer to chart for full details    PAIN: from eval Are you having pain? Yes: NPRS scale: not rated currently but can reach 9/10 Pain location: L hip and L leg Pain description: pt has difficulty describing Aggravating factors: unsure Relieving factors: laying in the recliner helps, movement  PRECAUTIONS: Fall  WEIGHT BEARING RESTRICTIONS: No  FALLS: Has patient fallen in last 6 months? Yes. Number of falls 1x, leg tangled in chair leg when getting up from a chair  LIVING ENVIRONMENT: Lives with: lives with their spouse Lives in: House/apartment Stairs:  Has following equipment at home:  pt not currently using AD, holds onto walls/furniture, does have walker and SPC at home  PLOF: Independent with basic ADLs  PATIENT GOALS: improve mobility  IMAGING: via chart  07/24/22 DG BONE density " ASSESSMENT: The probability of a major osteoporotic fracture is 15.8% within the next ten years.   The probability of a hip fracture is 2.9% within the next ten years."  12/22/21 DG lumbar spine " IMPRESSION: 1. No acute displaced fracture or traumatic listhesis of the lumbar spine. 2. Interval development of grade 1 anterolisthesis of L3 on L4. Stable grade 1 anterolisthesis of L4  on L5. Appears stable on flexion extension views. 3.  Aortic Atherosclerosis (ICD10-I70.0)."  Please refer to chart for full details  OBJECTIVE: taken from eval unless otherwise indicated  LSVT BIG EVALUATION & EXAMINATION   Neurological and Other Medical Information:   Pt known to clinic some answers  via chart (2022) What were your initial symptoms of Parkinson's disease? first diagnosed 2019   Do you have tremors?  Yes: R hand   Do you have any pain? Yes: B hip and back, refer to above    Medical Information:    Medication for Parkinson's disease: sinemet   Motor Symptoms:    When did you first start to notice changes in your movement you associate with Parkinson's disease? Recent changes following d/c from previous LSVT and HH - increasingly sedentary due to pain per spouse   What are your current symptoms? Difficulty getting out of a chair, sometimes has difficulty getting in and out of bed, difficult with walking, still dresses herself but is mod I and sometimes must sit to dress, requires assistance with bathing, needs assistance with meal prep (due to balance and difficulty using her hands), has general difficulty with sequencing tasks/activities   What do you do when you want to move the best you possibly can? Pt does not know    Has Parkinson's disease caused you to move less or be less active? Yes: feels most recent contribution is due to pain, freezing  Have you noticed if your movement is slower than it used to be? For example, walking, getting dressed, doing household chores, bathing, etc. Yes: pt spouse reports difficulty maintaining at home exercises following discharge   Have you or others noticed any changes in your posture? Yes: pt notices she doesn't always stand up straight   How many (if any) falls have you had in the last six months? 1x     Have you noticed any freezing with your movement? Yes:     Are there some activities you now need help with  because of your Parkinson's disease? See above     Assessment:     MMT: deferred due to time   Donning jacket: 17 seconds Doffing jacket: 7 seconds   Opening jar: 5.7 seconds with multiple attempts   Sit to stand assessment:  5x STS: 27 seconds    Gait assessments:  10 MWT:   0.58 m/s with rollator     Functional Component movements:   Sit to stand (required) Donning jacket (arm through sleeve) Opening jar Stepping over and on to obstacle (simulate getting in car simulation) Big and tall posture (stand BIG)   Hierarchies Getting in and out of bed 2. Ambulating for distance and over uneven surfaces/obstacles with rollator or LRD    PATIENT SURVEYS:  FOTO 40 (goal 49) - filled out by spouse due to pt impaired cognition   PATIENT EDUCATION:  Education details: Pt educated throughout session about proper posture and technique with exercises. Improved exercise technique, movement at target joints, use of target muscles after min to mod verbal, visual, tactile cues. LSVT BIG interventions, technique with rollator  Person educated: Patient and Spouse Education method: Explanation Education comprehension: verbalized understanding   TODAY'S TREATMENT:  DATE:    LSVT BIG Treatment:  Patient seen for LSVT Daily Session Maximal Daily Exercises for facilitation/coordination of movement Sustained movements are designed to rescale the amplitude of movement output for generalization to daily functional activities.  Maximal Adapted Daily Exercises: Floor to Ceiling 8 with 10 second hold - rates hard Side to Side 8 Bilateral with 10 second hold - rates hard, "I'm just aching today." Forward Step and Reach 12 Bilateral - instructed in hand flicks for LUE  Sideways Step and Reach 10 Bilateral Backwards Step and Reach 10 Bilateral Forwards Rock and  Reach 10 Bilateral Sideways Rock and Reach 10 Bilateral  Maximal Daily Exercise Comments: needs modeling for correct positions, VC/TC for big reach, increased amplitude, increased effort >75-80% of time. Rest breaks taken throughout due to fatigue and for pain modulation ( pt reports no increase from baseline). Pt's pain and dizziness closely monitored throughout and vitals taken (see below). Pt continues to exhibit carryover from prior sessions requiring less cuing/shaping for various interventions.     Functional Component movements:  5 reps performed of each Sit to stand (required)  2. Donning jacket (arm through sleeve) 3. Opening jar  4. Stepping over and on to obstacle (simulate getting in car simulation) - using 6" step  -- this specific intervention not performed due to BP cut-off for exertional activity 5. Big and tall posture (stand/sit BIG)  BP taken following intervention due to ongoing reports of dizziness, seated, LUE: 130/106 mmHg, HR 79 bpm  Pt completes jar opening activity seated and BP reassessed LUE after a few minutes of rest, seated: 128/78 mmHg HR 75 bpm   Hierarchy interventions not performed today due to time limitations and monitoring of BP to confirm pt is not too close to cut-off values for safe exercise Hierarchies Getting in and out of bed - instruction in rolling, sidelye>sit each side of bed. Pt continues to respond well to cuing with improved technique and reports no increase in LBP/hip pain 2. Ambulating for distance and over uneven surfaces/obstacles without AD, with CGA- incorporated into gait training - pt completes approx 100 ft. Steps over 1 obstacle with BUE support. Requires multiple rest breaks due to swimmy-headedness with some lightheadedness. This improves with rest. See gait training for further details.  Gait - Pt ambulates pushing transport chair approx 80 ft to speech appointment. Pt cues pt for upright posture and big step-length. Pt has no  symptoms with intervention and PT then assists pt into chair to begin her speech appointment.    Carryover Assignment: Big wave and tall posture to speech therapist     HOME EXERCISE PROGRAM:  Pt to perform all 7 LSVT BIG  (adapted) maximal daily exercises, 5 Functional Component Tasks and big walking and carryover assignment (see above)  GOALS: Goals reviewed with patient? Yes  SHORT TERM GOALS: Target date: 10/27/2022    Patient will be independent in home exercise program to improve strength/mobility for better functional independence with ADLs. Baseline: LSVT BIG protocol to be initiated next session Goal status: INITIAL   LONG TERM GOALS: Target date: 11/11/2022   Patient will increase FOTO score to equal to or greater than 49 to demonstrate statistically significant improvement in mobility and quality of life.  Baseline: 40 Goal status: INITIAL  2.  Patient (> 35 years old) will complete five times sit to stand test in < 15 seconds indicating decreased fall risk.  Baseline: 27 sec Goal status: INITIAL  3.  Patient will increase 10  meter walk test to >1.49ms with WFL B step-length, upright posture for >90% of test to improve ease with gait and to override hypokentic and bradykinetic movement.  Baseline: 0.58 m/s with 4WW Goal status: INITIAL  4.  Patient will reduce time to don jacket by at least 5 seconds in order to increase ease with dressing and override bradykinetic movement Baseline: 17 seconds Goal status: INITIAL  5.  Pt will demo or report ability to open standard jar using only 1-2 attempts in order to exhibit improved functional mobility for ADLs.  Baseline: 5.7 seconds with multiple attempts to turn jar lid to open Goal status: INITIAL  ASSESSMENT:  CLINICAL IMPRESSION: Session somewhat limited due to ongoing reports of dizziness, fatigue and high diastolic BP found with vitals assessment. Pt BP was 130/106 mmHg. This did decrease with further erst. PT  provided speech therapist with initial BP number when assisting pt to her speech appointment. SLP wrote down values to provide to pt's spouse so they can monitor pt's BP at home. The pt will benefit from further LSVT BIG PT in order to address these deficits to improve overall functional mobility, QOL and to decrease fall risk.  OBJECTIVE IMPAIRMENTS: Abnormal gait, decreased balance, decreased cognition, decreased coordination, decreased endurance, decreased knowledge of use of DME, decreased mobility, difficulty walking, decreased strength, dizziness, hypomobility, impaired UE functional use, improper body mechanics, postural dysfunction, and pain.   ACTIVITY LIMITATIONS: lifting, bending, standing, squatting, stairs, transfers, bed mobility, bathing, dressing, hygiene/grooming, and locomotion level  PARTICIPATION LIMITATIONS: meal prep, cleaning, laundry, medication management, personal finances, driving, shopping, community activity, and yard work  PERSONAL FACTORS: Age, Fitness, Sex, Time since onset of injury/illness/exacerbation, and 3+ comorbidities: PMH includes hx of breast cancer, HTN, MCI, insomnia, cataracts, bipolar 1, alcohol use disorder, GAD, MDD, osteopenia of neck of R femur, chronic B low back pain with radiating pain to buttock and posterior thighs and calves. Pt with multiple comorbidities, please refer to chart for full details  are also affecting patient's functional outcome.   REHAB POTENTIAL: Fair    CLINICAL DECISION MAKING: Evolving/moderate complexity  EVALUATION COMPLEXITY: High  PLAN:  PT FREQUENCY: 4x/week  PT DURATION: 6 weeks  PLANNED INTERVENTIONS: Therapeutic exercises, Therapeutic activity, Neuromuscular re-education, Balance training, Gait training, Patient/Family education, Self Care, Joint mobilization, Stair training, Vestibular training, Canalith repositioning, Orthotic/Fit training, DME instructions, Wheelchair mobility training, Spinal mobilization,  Cryotherapy, Moist heat, Splintting, Taping, Manual therapy, and Re-evaluation  PLAN FOR NEXT SESSION: adapted maximal daily exercises, functional component tasks, hierarchy tasks, big walking   HZollie Pee PT 10/12/2022, 12:05 PM

## 2022-10-12 NOTE — Therapy (Signed)
OUTPATIENT PHYSICAL THERAPY LSVT BIG TREATMENT   Patient Name: Kendra [name] MRN: [id number] DOB:May 21, 1946, 76 y.o., female Today's Date: 10/13/2022  PCP: Virginia Crews, MD REFERRING PROVIDER: Virginia Crews, MD  END OF SESSION:  PT End of Session - 10/13/22 1126     Visit Number 7    Number of Visits 17    Date for PT Re-Evaluation 11/10/22    PT Start Time 1005    PT Stop Time 1100    PT Time Calculation (min) 55 min    Equipment Utilized During Treatment Gait belt    Activity Tolerance Patient tolerated treatment well;Patient limited by fatigue;No increased pain    Behavior During Therapy [facility] ([place] for tasks assessed/performed               Past Medical History:  Diagnosis Date   Anxiety    Breast cancer ([place]) 03/2018   right breast cancer at 11:00 and 1:00   Bursitis    bilateral hips and knees   Complication of anesthesia    Depression    Family history of breast cancer 03/24/2018   Family history of lung cancer 03/24/2018   History of alcohol dependence ([place]) 2007   no ETOH; resolved since 2007   History of hiatal hernia    History of psychosis 2014   due to sleep disturbance   Hypertension    Parkinson disease    Personal history of radiation therapy 06/2018-07/2018   right breast ca   PONV (postoperative nausea and vomiting)    Past Surgical History:  Procedure Laterality Date   BREAST BIOPSY Right 03/14/2018   11:00 DCIS and invasive ductal carcinoma   BREAST BIOPSY Right 03/14/2018   1:00 Invasive ductal carcinoma   BREAST LUMPECTOMY Right 04/27/2018   lumpectomy of 11 and 1:00 cancers, clear margins, negative LN   BREAST LUMPECTOMY WITH RADIOACTIVE SEED AND SENTINEL LYMPH NODE BIOPSY Right 04/27/2018   Procedure: RIGHT BREAST LUMPECTOMY WITH RADIOACTIVE SEED X 2 AND RIGHT SENTINEL LYMPH NODE BIOPSY;  Surgeon: [name], MD;  Location: [place];  Service: General;  Laterality: Right;   CATARACT EXTRACTION W/PHACO  Left 08/30/2019   Procedure: CATARACT EXTRACTION PHACO AND INTRAOCULAR LENS PLACEMENT ([place]) LEFT panoptix toric  01:20.5  12.7%  10.26;  Surgeon: Leandrew Koyanagi, MD;  Location: [place];  Service: Ophthalmology;  Laterality: Left;   CATARACT EXTRACTION W/PHACO Right 09/20/2019   Procedure: CATARACT EXTRACTION PHACO AND INTRAOCULAR LENS PLACEMENT ([facility]) RIGHT 5.71  00:36.4  15.7%;  Surgeon: Leandrew Koyanagi, MD;  Location: [place];  Service: Ophthalmology;  Laterality: Right;   LAPAROTOMY N/A 02/10/2018   Procedure: EXPLORATORY LAPAROTOMY;  Surgeon: Isabel Caprice, MD;  Location: [facility];  Service: Gynecology;  Laterality: N/A;   MASS EXCISION  01/2018   abdominal   OVARIAN CYST REMOVAL     SALPINGOOPHORECTOMY Bilateral 02/10/2018   Procedure: BILATERAL SALPINGO OOPHORECTOMY; PERITONEAL WASHINGS;  Surgeon: Isabel Caprice, MD;  Location: [facility];  Service: Gynecology;  Laterality: Bilateral;   TONSILLECTOMY     TONSILLECTOMY AND ADENOIDECTOMY  1953   Patient Active Problem List   Diagnosis Date Noted   Blepharitis of upper and lower eyelids of both eyes 08/17/2022   Subacute cough 08/17/2022   History of allergy to shellfish 02/24/2022   Chronic low back pain of over 3 months duration 02/04/2022   Osteoarthritis of  AC (acromioclavicular) joint (Left) 02/04/2022   Osteoarthritis of hip (Right) 02/04/2022   Impaction fracture of  distal end of radius and ulna, sequela (Left) 02/04/2022   Osteoarthritis of 1st (thumb) carpometacarpal [facility]) joint of hand (Left) 02/04/2022   Lumbosacral facet arthropathy (Multilevel) (Bilateral) (T12-S1) 02/04/2022   Lumbar central spinal stenosis w/o neurogenic claudication (L2-3, L3-4, L4-5) 02/04/2022   Lumbar foraminal stenosis (Bilateral: L2-3, L4-5) (Right: (Severe) L4-5) 02/04/2022   Lumbar lateral recess stenosis (Bilateral: L2-3, L3-4, and L4-5) (Severe) 02/04/2022   DDD (degenerative disc disease), lumbar 02/04/2022    Lumbosacral facet syndrome (Multilevel) (Bilateral) 02/04/2022   Osteoarthritis of lumbar spine 02/04/2022   Erythema ab igne 12/23/2021   Allergic conjunctivitis of both eyes 12/23/2021   Chronic hip pain (3ry area of Pain) (Bilateral) (L>R) 12/22/2021   Chronic lower extremity pain (2ry area of Pain) (Bilateral) (L>R) 12/22/2021   Abnormal MRI, lumbar spine (01/31/2020) 12/22/2021   Grade 1 Anterolisthesis of lumbar spine (L3/L4) & (5 mm) (L4/L5) 12/22/2021   Chronic pain syndrome 11/17/2021   High risk medication use 11/17/2021   Disorder of skeletal system 11/17/2021   Problems influencing health status 11/17/2021   Other hyperlipidemia 10/21/2021   Polypharmacy 08/12/2021   Chronic low back pain (1ry area of Pain) (Bilateral) (L>R) w/o sciatica 06/18/2021   Elevated blood pressure reading 05/23/2021   Osteopenia of neck of femur (Right) 09/06/2020   Obesity 08/29/2020   Parkinson disease 02/22/2020   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   Bipolar disorder, in full remission, most recent episode depressed ([place]) 11/08/2019   Vitamin D insufficiency 08/24/2019   Insomnia due to mental condition 05/31/2019   Bipolar I disorder, most recent episode depressed ([place]) 05/31/2019   Alcohol use disorder, moderate, in sustained remission ([place]) 05/31/2019   Elevated tumor markers 05/27/2019   GAD (generalized anxiety disorder) 04/10/2019   MDD (major depressive disorder), severe ([place]) 01/31/2019   High serum carbohydrate antigen 19-9 (CA19-9) 12/01/2018   Adjustment disorder with anxious mood 11/24/2018   Essential hypertension 11/24/2018   B12 deficiency 10/17/2018   Incisional hernia, without obstruction or gangrene 09/12/2018   Genetic testing 04/07/2018   Family history of breast cancer 03/24/2018   Family history of lung cancer 03/24/2018   Malignant neoplasm of upper-outer quadrant of right breast in female, estrogen receptor positive ([place]) 03/17/2018   Malignant neoplasm of upper-inner  quadrant of right breast in female, estrogen receptor positive ([place]) 03/17/2018   History of psychosis 02/07/2018   History of insomnia 02/07/2018    ONSET DATE: Pt diagnosed with PD in 2019  REFERRING DIAG:  G20 (ICD-10-CM) - Parkinson's disease      THERAPY DIAG:  Unsteadiness on feet  Other lack of coordination  Muscle weakness (generalized)  Difficulty in walking, not elsewhere classified  Pain in left hip  Rationale for Evaluation and Treatment: Rehabilitation  SUBJECTIVE:  SUBJECTIVE STATEMENT: Pt reports doing some of her HEP. She continues to have L hip pain. Describes her dizziness more as a "fuzziness." No other new reports/concerns.  Pain: not rated but reports pain in L hip is present Pt accompanied by: significant other self  PERTINENT HISTORY:  Pt is a pleasant 76 yo female who was diagnosed with PD in 2019. She is known to clinic and [facility], and was last seen for LSVT BIG Sept-Oct 2022. Pt with chronic pain and has become increasingly sedentary as a result exacerbating mobility difficulties due to PD. Current concerns: Pt reports L and R hip pain and back pain. Pt was being seen by pain management clinic. Pt has had injections that provided relief at the time. Pt's spouse reports her physician determined surgery was not an option for her pain so the plan is to manage it. Pt spouse thinks pain has resulted in reluctance for pt to move. Pt currently sedentary, doesn't walk much, but when she does she holds on to furniture for support due to balance problems. When pt first stands she must hold on to something due to mix of pain and/or dizziness (described as lightheadedness). Her pain can go from 3/10 to 9/10. It does improve once she starts moving. Pt has increased difficulty with  multiple activities including: getting in and out of her bed, getting in and out of her car, freezing, decreased gait speed, decreased balance and cognition, and difficulty using BUE due to tremors.  Per chart other PMH includes hx of breast cancer, HTN, MCI, insomnia, cataracts, bipolar 1, alcohol use disorder, GAD, MDD, osteopenia of neck of R femur, chronic B low back pain with radiating pain to buttock and posterior thighs and calves. Pt with multiple comorbidities, please refer to chart for full details    PAIN: from eval Are you having pain? Yes: NPRS scale: not rated currently but can reach 9/10 Pain location: L hip and L leg Pain description: pt has difficulty describing Aggravating factors: unsure Relieving factors: laying in the recliner helps, movement  PRECAUTIONS: Fall  WEIGHT BEARING RESTRICTIONS: No  FALLS: Has patient fallen in last 6 months? Yes. Number of falls 1x, leg tangled in chair leg when getting up from a chair  LIVING ENVIRONMENT: Lives with: lives with their spouse Lives in: House/apartment Stairs:  Has following equipment at home:  pt not currently using AD, holds onto walls/furniture, does have walker and SPC at home  PLOF: Independent with basic ADLs  PATIENT GOALS: improve mobility  IMAGING: via chart  07/24/22 DG BONE density " ASSESSMENT: The probability of a major osteoporotic fracture is 15.8% within the next ten years.   The probability of a hip fracture is 2.9% within the next ten years."  12/22/21 DG lumbar spine " IMPRESSION: 1. No acute displaced fracture or traumatic listhesis of the lumbar spine. 2. Interval development of grade 1 anterolisthesis of L3 on L4. Stable grade 1 anterolisthesis of L4 on L5. Appears stable on flexion extension views. 3.  Aortic Atherosclerosis (ICD10-I70.0)."  Please refer to chart for full details  OBJECTIVE: taken from eval unless otherwise indicated  LSVT BIG EVALUATION & EXAMINATION    Neurological and Other Medical Information:   Pt known to clinic some answers via chart (2022) What were your initial symptoms of Parkinson's disease? first diagnosed 2019   Do you have tremors?  Yes: R hand   Do you have any pain? Yes: B hip and back, refer to above    Medical  Information:    Medication for Parkinson's disease: sinemet   Motor Symptoms:    When did you first start to notice changes in your movement you associate with Parkinson's disease? Recent changes following d/c from previous LSVT and HH - increasingly sedentary due to pain per spouse   What are your current symptoms? Difficulty getting out of a chair, sometimes has difficulty getting in and out of bed, difficult with walking, still dresses herself but is mod I and sometimes must sit to dress, requires assistance with bathing, needs assistance with meal prep (due to balance and difficulty using her hands), has general difficulty with sequencing tasks/activities   What do you do when you want to move the best you possibly can? Pt does not know    Has Parkinson's disease caused you to move less or be less active? Yes: feels most recent contribution is due to pain, freezing  Have you noticed if your movement is slower than it used to be? For example, walking, getting dressed, doing household chores, bathing, etc. Yes: pt spouse reports difficulty maintaining at home exercises following discharge   Have you or others noticed any changes in your posture? Yes: pt notices she doesn't always stand up straight   How many (if any) falls have you had in the last six months? 1x     Have you noticed any freezing with your movement? Yes:     Are there some activities you now need help with because of your Parkinson's disease? See above     Assessment:     MMT: deferred due to time   Donning jacket: 17 seconds Doffing jacket: 7 seconds   Opening jar: 5.7 seconds with multiple attempts   Sit to stand assessment:   5x STS: 27 seconds    Gait assessments:  10 MWT:   0.58 m/s with rollator     Functional Component movements:   Sit to stand (required) Donning jacket (arm through sleeve) Opening jar Stepping over and on to obstacle (simulate getting in car simulation) Big and tall posture (stand BIG)   Hierarchies Getting in and out of bed 2. Ambulating for distance and over uneven surfaces/obstacles with rollator or LRD    PATIENT SURVEYS:  FOTO 40 (goal 49) - filled out by spouse due to pt impaired cognition   PATIENT EDUCATION:  Education details: Pt educated throughout session about proper posture and technique with exercises. Improved exercise technique, movement at target joints, use of target muscles after min to mod verbal, visual, tactile cues. LSVT BIG interventions, continued instruction in technique with rollator  Person educated: Patient and Spouse Education method: Explanation Education comprehension: verbalized understanding   TODAY'S TREATMENT:                                                                                                                                         DATE:   Vitals  taken at start of session LUE 137/76 mmHg HR 82   LSVT BIG Treatment:  Patient seen for LSVT Daily Session Maximal Daily Exercises for facilitation/coordination of movement Sustained movements are designed to rescale the amplitude of movement output for generalization to daily functional activities.  Maximal Adapted Daily Exercises: Floor to Ceiling 8 with 10 second hold  Side to Side 8 Bilateral with 10 second hold  Forward Step and Reach 12 Bilateral    Vitals, Seated, LUE: 144/74 mmHg, HR 89 bpm  Sideways Step and Reach 12 Bilateral Backwards Step and Reach 12 Bilateral Forwards Rock and Reach 10 Bilateral Sideways Rock and Reach 10 Bilateral  Maximal Daily Exercise Comments: needs modeling for correct positions, VC/TC for big reach, increased amplitude, increased effort  >70-75% of time. Rest breaks taken throughout due to fatigue and for pain modulation ( pt reports no increase from baseline), however able to perform more reps overall today total and more reps before needing a rest break. Pt's pain and dizziness closely monitored throughout and vitals taken (see above).    Functional Component movements:  5 reps performed of each Sit to stand (required)  2. Donning jacket (arm through sleeve) 3. Opening jar  4. Stepping over and on to obstacle (simulate getting in car simulation) - using 6" step   5. Big and tall posture (stand/sit BIG)  Hierarchies Getting in and out of bed - instruction in rolling, sidelye>sit each side of bed.Performed to each side of bed. 2. Ambulating for distance and over uneven surfaces/obstacles, with CGA- incorporated into gait training - pt completes approx 2x148 ft use of rollator and instruction in use of rollator. Pt weaves around obstacles in clinic gym. Able to complete without rest break.  Gait - pt completes approx 2x148 ft use of rollator and instruction in use of rollator. Pt weaves around obstacles in clinic gym. Able to complete without rest break.Pt cues pt for upright posture and big step-length. Pt has no symptoms with intervention.    Carryover Assignment: Big posture when first sitting down at hair appointment.    HOME EXERCISE PROGRAM:  Pt to perform all 7 LSVT BIG  (adapted) maximal daily exercises, 5 Functional Component Tasks and big walking and carryover assignment (see above)  GOALS: Goals reviewed with patient? Yes  SHORT TERM GOALS: Target date: 10/27/2022    Patient will be independent in home exercise program to improve strength/mobility for better functional independence with ADLs. Baseline: LSVT BIG protocol to be initiated next session Goal status: INITIAL   LONG TERM GOALS: Target date: 11/11/2022   Patient will increase FOTO score to equal to or greater than 49 to demonstrate  statistically significant improvement in mobility and quality of life.  Baseline: 40 Goal status: INITIAL  2.  Patient (> 78 years old) will complete five times sit to stand test in < 15 seconds indicating decreased fall risk.  Baseline: 27 sec Goal status: INITIAL  3.  Patient will increase 10 meter walk test to >1.103ms with WFL B step-length, upright posture for >90% of test to improve ease with gait and to override hypokentic and bradykinetic movement.  Baseline: 0.58 m/s with 4WW Goal status: INITIAL  4.  Patient will reduce time to don jacket by at least 5 seconds in order to increase ease with dressing and override bradykinetic movement Baseline: 17 seconds Goal status: INITIAL  5.  Pt will demo or report ability to open standard jar using only 1-2 attempts in order to exhibit improved  functional mobility for ADLs.  Baseline: 5.7 seconds with multiple attempts to turn jar lid to open Goal status: INITIAL  ASSESSMENT:  CLINICAL IMPRESSION: BP continued to be monitored during session and pt with improved readings today and is able to complete all interventions. Pt also showed progress by performing more reps total in general and before requiring a rest break. Fatigue is still pt's primary limiting factor. The pt will benefit from further LSVT BIG PT in order to address these deficits to improve overall functional mobility, QOL and to decrease fall risk.  OBJECTIVE IMPAIRMENTS: Abnormal gait, decreased balance, decreased cognition, decreased coordination, decreased endurance, decreased knowledge of use of DME, decreased mobility, difficulty walking, decreased strength, dizziness, hypomobility, impaired UE functional use, improper body mechanics, postural dysfunction, and pain.   ACTIVITY LIMITATIONS: lifting, bending, standing, squatting, stairs, transfers, bed mobility, bathing, dressing, hygiene/grooming, and locomotion level  PARTICIPATION LIMITATIONS: meal prep, cleaning, laundry,  medication management, personal finances, driving, shopping, community activity, and yard work  PERSONAL FACTORS: Age, Fitness, Sex, Time since onset of injury/illness/exacerbation, and 3+ comorbidities: PMH includes hx of breast cancer, HTN, MCI, insomnia, cataracts, bipolar 1, alcohol use disorder, GAD, MDD, osteopenia of neck of R femur, chronic B low back pain with radiating pain to buttock and posterior thighs and calves. Pt with multiple comorbidities, please refer to chart for full details  are also affecting patient's functional outcome.   REHAB POTENTIAL: Fair    CLINICAL DECISION MAKING: Evolving/moderate complexity  EVALUATION COMPLEXITY: High  PLAN:  PT FREQUENCY: 4x/week  PT DURATION: 6 weeks  PLANNED INTERVENTIONS: Therapeutic exercises, Therapeutic activity, Neuromuscular re-education, Balance training, Gait training, Patient/Family education, Self Care, Joint mobilization, Stair training, Vestibular training, Canalith repositioning, Orthotic/Fit training, DME instructions, Wheelchair mobility training, Spinal mobilization, Cryotherapy, Moist heat, Splintting, Taping, Manual therapy, and Re-evaluation  PLAN FOR NEXT SESSION: adapted maximal daily exercises, functional component tasks, hierarchy tasks, big walking   Zollie Pee, PT 10/13/2022, 11:33 AM

## 2022-10-12 NOTE — Therapy (Signed)
OUTPATIENT SPEECH LANGUAGE PATHOLOGY LSVT LOUD TREATMENT   Patient Name: Kendra [name] MRN: [id number] DOB:05/03/1946, 76 y.o., female Today's Date: 10/12/2022  PCP: Lavon Paganini, MD REFERRING PROVIDER: Tally Joe, FNP   End of Session - 10/12/22 1110     Visit Number 6    Number of Visits 17    Date for SLP Re-Evaluation 11/28/22    SLP Start Time 44    SLP Stop Time  1200    SLP Time Calculation (min) 60 min    Activity Tolerance Patient tolerated treatment well             Past Medical History:  Diagnosis Date   Anxiety    Breast cancer ([place]) 03/2018   right breast cancer at 11:00 and 1:00   Bursitis    bilateral hips and knees   Complication of anesthesia    Depression    Family history of breast cancer 03/24/2018   Family history of lung cancer 03/24/2018   History of alcohol dependence ([place]) 2007   no ETOH; resolved since 2007   History of hiatal hernia    History of psychosis 2014   due to sleep disturbance   Hypertension    Parkinson disease    Personal history of radiation therapy 06/2018-07/2018   right breast ca   PONV (postoperative nausea and vomiting)    Past Surgical History:  Procedure Laterality Date   BREAST BIOPSY Right 03/14/2018   11:00 DCIS and invasive ductal carcinoma   BREAST BIOPSY Right 03/14/2018   1:00 Invasive ductal carcinoma   BREAST LUMPECTOMY Right 04/27/2018   lumpectomy of 11 and 1:00 cancers, clear margins, negative LN   BREAST LUMPECTOMY WITH RADIOACTIVE SEED AND SENTINEL LYMPH NODE BIOPSY Right 04/27/2018   Procedure: RIGHT BREAST LUMPECTOMY WITH RADIOACTIVE SEED X 2 AND RIGHT SENTINEL LYMPH NODE BIOPSY;  Surgeon: [name], MD;  Location: [place];  Service: General;  Laterality: Right;   CATARACT EXTRACTION W/PHACO Left 08/30/2019   Procedure: CATARACT EXTRACTION PHACO AND INTRAOCULAR LENS PLACEMENT ([place]) LEFT panoptix toric  01:20.5  12.7%  10.26;  Surgeon: Leandrew Koyanagi, MD;   Location: [place];  Service: Ophthalmology;  Laterality: Left;   CATARACT EXTRACTION W/PHACO Right 09/20/2019   Procedure: CATARACT EXTRACTION PHACO AND INTRAOCULAR LENS PLACEMENT ([facility]) RIGHT 5.71  00:36.4  15.7%;  Surgeon: Leandrew Koyanagi, MD;  Location: [place];  Service: Ophthalmology;  Laterality: Right;   LAPAROTOMY N/A 02/10/2018   Procedure: EXPLORATORY LAPAROTOMY;  Surgeon: Isabel Caprice, MD;  Location[facility];  Service: Gynecology;  Laterality: N/A;   MASS EXCISION  01/2018   abdominal   OVARIAN CYST REMOVAL     SALPINGOOPHORECTOMY Bilateral 02/10/2018   Procedure: BILATERAL SALPINGO OOPHORECTOMY; PERITONEAL WASHINGS;  Surgeon: Isabel Caprice, MD;  Location: [facility];  Service: Gynecology;  Laterality: Bilateral;   TONSILLECTOMY     TONSILLECTOMY AND ADENOIDECTOMY  1953   Patient Active Problem List   Diagnosis Date Noted   Blepharitis of upper and lower eyelids of both eyes 08/17/2022   Subacute cough 08/17/2022   History of allergy to shellfish 02/24/2022   Chronic low back pain of over 3 months duration 02/04/2022   Osteoarthritis of  AC (acromioclavicular) joint (Left) 02/04/2022   Osteoarthritis of hip (Right) 02/04/2022   Impaction fracture of distal end of radius and ulna, sequela (Left) 02/04/2022   Osteoarthritis of 1st (thumb) carpometacarpal (CMC) joint of hand (Left) 02/04/2022   Lumbosacral facet arthropathy (Multilevel) (Bilateral) (T12-S1)  02/04/2022   Lumbar central spinal stenosis w/o neurogenic claudication (L2-3, L3-4, L4-5) 02/04/2022   Lumbar foraminal stenosis (Bilateral: L2-3, L4-5) (Right: (Severe) L4-5) 02/04/2022   Lumbar lateral recess stenosis (Bilateral: L2-3, L3-4, and L4-5) (Severe) 02/04/2022   DDD (degenerative disc disease), lumbar 02/04/2022   Lumbosacral facet syndrome (Multilevel) (Bilateral) 02/04/2022   Osteoarthritis of lumbar spine 02/04/2022   Erythema ab igne 12/23/2021   Allergic conjunctivitis of both  eyes 12/23/2021   Chronic hip pain (3ry area of Pain) (Bilateral) (L>R) 12/22/2021   Chronic lower extremity pain (2ry area of Pain) (Bilateral) (L>R) 12/22/2021   Abnormal MRI, lumbar spine (01/31/2020) 12/22/2021   Grade 1 Anterolisthesis of lumbar spine (L3/L4) & (5 mm) (L4/L5) 12/22/2021   Chronic pain syndrome 11/17/2021   High risk medication use 11/17/2021   Disorder of skeletal system 11/17/2021   Problems influencing health status 11/17/2021   Other hyperlipidemia 10/21/2021   Polypharmacy 08/12/2021   Chronic low back pain (1ry area of Pain) (Bilateral) (L>R) w/o sciatica 06/18/2021   Elevated blood pressure reading 05/23/2021   Osteopenia of neck of femur (Right) 09/06/2020   Obesity 08/29/2020   Parkinson disease 02/22/2020   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   Bipolar disorder, in full remission, most recent episode depressed ([place]) 11/08/2019   Vitamin D insufficiency 08/24/2019   Insomnia due to mental condition 05/31/2019   Bipolar I disorder, most recent episode depressed ([place]) 05/31/2019   Alcohol use disorder, moderate, in sustained remission ([place]) 05/31/2019   Elevated tumor markers 05/27/2019   GAD (generalized anxiety disorder) 04/10/2019   MDD (major depressive disorder), severe ([place]) 01/31/2019   High serum carbohydrate antigen 19-9 (CA19-9) 12/01/2018   Adjustment disorder with anxious mood 11/24/2018   Essential hypertension 11/24/2018   B12 deficiency 10/17/2018   Incisional hernia, without obstruction or gangrene 09/12/2018   Genetic testing 04/07/2018   Family history of breast cancer 03/24/2018   Family history of lung cancer 03/24/2018   Malignant neoplasm of upper-outer quadrant of right breast in female, estrogen receptor positive ([place]) 03/17/2018   Malignant neoplasm of upper-inner quadrant of right breast in female, estrogen receptor positive ([place]) 03/17/2018   History of psychosis 02/07/2018   History of insomnia 02/07/2018    Onset date: PD dx  2021  REFERRING DIAG: Parkinson's disease  THERAPY DIAG:  Dysarthria and anarthria  Rationale for Evaluation and Treatment Rehabilitation  SUBJECTIVE:   SUBJECTIVE STATEMENT: "My head feels full."  Pt accompanied by:  self  PERTINENT HISTORY: Patient is a 76 y.o. female with history of Parkinson's disease (dx 2021), anxiety, depression, psychosis, HTN, bipolar disorder, alcoholism, memory issues since 2005, hx head trauma. Referred for LSVT BIG and LOUD; known to our clinic from prior course of therapy completed in 2022.  DIAGNOSTIC FINDINGS: MRI Brain 08/08/2018: "1. No acute intracranial abnormality and largely unremarkable for age gray and white matter signal. 2. NeuroQuant volumetric analysis of the brain suggests age advanced brain volume loss, see details on [facility] PACS."  PAIN:  Are you having pain? Yes: NPRS scale: 6/10 Pain location: left hip   PATIENT GOALS communicate better  OBJECTIVE:   TODAY'S TREATMENT: Pt reports practicing over the weekend. LSVT Daily tasks with occasional min cues. Loud /a/ averaged 81 dB, duration 14.1 seconds, pitch avg 225 Hz.  Max F0 for pitch glides was 288 Hz (80 dB avg), min F0 for low glides was 160 Hz (82 dB avg). Functional phrases averaged 74 dB with occasional min cues. Hierarchical loudness drills at sentence level  averaged 72 dB with min cues, progressed to sentences with cognitive load, average 68 dB with usual mod cues. Patient continues to increase efforts at self-correction during spontaneous responses.    PATIENT EDUCATION: Education details: Practice over the weekend is important to help maintain gains she has made this week. Person educated: Patient and Spouse Education method: Explanation and Verbal cues Education comprehension: verbalized understanding   HOME EXERCISE PROGRAM: To be provided next session     GOALS: Goals reviewed with patient? Yes  SHORT TERM GOALS: Target date: 10 sessions  The patient will  complete Daily Tasks (Maximum duration "ah", High/Lows, and Functional Phrases) at average loudness >/= 85 dB and with loud, good quality voice with min cues.  Baseline: Goal status: INITIAL  2.  The patient will complete Hierarchal Speech Loudness reading drills (words/phrases, sentences) at average >/= 75 dB and with loud, good quality voice with min cues. Baseline:  Goal status: INITIAL  3.  The patient will participate in 5-8 minutes conversation, maintaining average loudness of 75 dB and loud, good quality voice with min cues. Baseline:  Goal status: INITIAL   LONG TERM GOALS: Target date:   The patient will complete Daily Tasks (Maximum duration "ah", High/Lows, and Functional Phrases) at average loudness >/= 85 dB and with loud, good quality voice Baseline:  Goal status: INITIAL  2.  The patient will complete Hierarchal Speech Loudness reading drills (words/phrases, sentences, and paragraph) at average >/= 75 dB and with loud, good quality voice. Baseline:  Goal status: INITIAL  3.  The patient will participate in 15-20 minutes conversation, maintaining average loudness of >/= 75 dB and loud, good quality voice. Baseline:  Goal status: INITIAL  4.  Patient will report improved communication effectiveness as measured by Communicative Effectiveness Survey.  Baseline: 18/32 on 09/29/22 Goal status: INITIAL   ASSESSMENT:  CLINICAL IMPRESSION: Patient is a 76 y.o. female who presents with mild-moderate dysarthria characterized by reduced vocal intensity, reduced pitch variability, hoarse vocal quality, and subjectively low pitch for gender. Patient is demonstrating improved awareness of low-intensity speech, and is initiating attempts to increase volume at times, however cues remain necessary for consistent awareness. She remains stimulable for improvements in intensity, vocal quality, and pitch range with clinician model and cues for "Loud like me," and is using this with min  cues in phrase level structured tasks. I recommend course of skilled ST for LSVT-LOUD to improve vocal quality and intensity for communicating with family and friends.   OBJECTIVE IMPAIRMENTS include dysarthria and voice disorder. These impairments are limiting patient from effectively communicating at home and in community. Factors affecting potential to achieve goals and functional outcome are ability to learn/carryover information and previous level of function.. Patient will benefit from skilled SLP services to address above impairments and improve overall function.  REHAB POTENTIAL: Good  PLAN: SLP FREQUENCY: 4x/week  SLP DURATION: 4 weeks  PLANNED INTERVENTIONS: Cueing hierachy, Functional tasks, SLP instruction and feedback, Compensatory strategies, Patient/family education, and LSVT-LOUD      Deneise Lever, MS, Actor [phone]

## 2022-10-13 ENCOUNTER — Other Ambulatory Visit: Payer: Self-pay | Admitting: Internal Medicine

## 2022-10-13 ENCOUNTER — Ambulatory Visit: Payer: Medicare PPO

## 2022-10-13 ENCOUNTER — Ambulatory Visit: Payer: Medicare PPO | Admitting: Speech Pathology

## 2022-10-13 ENCOUNTER — Other Ambulatory Visit: Payer: Self-pay | Admitting: *Deleted

## 2022-10-13 DIAGNOSIS — M6281 Muscle weakness (generalized): Secondary | ICD-10-CM | POA: Diagnosis not present

## 2022-10-13 DIAGNOSIS — Z170 Estrogen receptor positive status [ER+]: Secondary | ICD-10-CM

## 2022-10-13 DIAGNOSIS — R471 Dysarthria and anarthria: Secondary | ICD-10-CM

## 2022-10-13 DIAGNOSIS — R262 Difficulty in walking, not elsewhere classified: Secondary | ICD-10-CM | POA: Diagnosis not present

## 2022-10-13 DIAGNOSIS — R278 Other lack of coordination: Secondary | ICD-10-CM

## 2022-10-13 DIAGNOSIS — M25552 Pain in left hip: Secondary | ICD-10-CM

## 2022-10-13 DIAGNOSIS — M79605 Pain in left leg: Secondary | ICD-10-CM | POA: Diagnosis not present

## 2022-10-13 DIAGNOSIS — R2681 Unsteadiness on feet: Secondary | ICD-10-CM | POA: Diagnosis not present

## 2022-10-13 DIAGNOSIS — M79604 Pain in right leg: Secondary | ICD-10-CM | POA: Diagnosis not present

## 2022-10-13 DIAGNOSIS — R41841 Cognitive communication deficit: Secondary | ICD-10-CM | POA: Diagnosis not present

## 2022-10-13 MED ORDER — LETROZOLE 2.5 MG PO TABS: 2.5000 mg | ORAL_TABLET | Freq: Every day | ORAL | 1 refills | Status: DC

## 2022-10-13 NOTE — Therapy (Signed)
OUTPATIENT SPEECH LANGUAGE PATHOLOGY LSVT LOUD TREATMENT   Patient Name: Kendra [name] MRN: [id number] DOB:10-21-1946, 76 y.o., female Today's Date: 10/13/2022  PCP: Lavon Paganini, MD REFERRING PROVIDER: Tally Joe, FNP   End of Session - 10/13/22 1121     Visit Number 7    Number of Visits 17    Date for SLP Re-Evaluation 11/28/22    SLP Start Time 77    SLP Stop Time  1200    SLP Time Calculation (min) 60 min    Activity Tolerance Patient tolerated treatment well             Past Medical History:  Diagnosis Date   Anxiety    Breast cancer ([place]) 03/2018   right breast cancer at 11:00 and 1:00   Bursitis    bilateral hips and knees   Complication of anesthesia    Depression    Family history of breast cancer 03/24/2018   Family history of lung cancer 03/24/2018   History of alcohol dependence ([place]) 2007   no ETOH; resolved since 2007   History of hiatal hernia    History of psychosis 2014   due to sleep disturbance   Hypertension    Parkinson disease    Personal history of radiation therapy 06/2018-07/2018   right breast ca   PONV (postoperative nausea and vomiting)    Past Surgical History:  Procedure Laterality Date   BREAST BIOPSY Right 03/14/2018   11:00 DCIS and invasive ductal carcinoma   BREAST BIOPSY Right 03/14/2018   1:00 Invasive ductal carcinoma   BREAST LUMPECTOMY Right 04/27/2018   lumpectomy of 11 and 1:00 cancers, clear margins, negative LN   BREAST LUMPECTOMY WITH RADIOACTIVE SEED AND SENTINEL LYMPH NODE BIOPSY Right 04/27/2018   Procedure: RIGHT BREAST LUMPECTOMY WITH RADIOACTIVE SEED X 2 AND RIGHT SENTINEL LYMPH NODE BIOPSY;  Surgeon: [name], MD;  Location: [facility];  Service: General;  Laterality: Right;   CATARACT EXTRACTION W/PHACO Left 08/30/2019   Procedure: CATARACT EXTRACTION PHACO AND INTRAOCULAR LENS PLACEMENT ([place]) LEFT panoptix toric  01:20.5  12.7%  10.26;  Surgeon: Leandrew Koyanagi, MD;   Location: [place];  Service: Ophthalmology;  Laterality: Left;   CATARACT EXTRACTION W/PHACO Right 09/20/2019   Procedure: CATARACT EXTRACTION PHACO AND INTRAOCULAR LENS PLACEMENT ([facility]) RIGHT 5.71  00:36.4  15.7%;  Surgeon: Leandrew Koyanagi, MD;  Location: [place];  Service: Ophthalmology;  Laterality: Right;   LAPAROTOMY N/A 02/10/2018   Procedure: EXPLORATORY LAPAROTOMY;  Surgeon: Isabel Caprice, MD;  [facility];  Service: Gynecology;  Laterality: N/A;   MASS EXCISION  01/2018   abdominal   OVARIAN CYST REMOVAL     SALPINGOOPHORECTOMY Bilateral 02/10/2018   Procedure: BILATERAL SALPINGO OOPHORECTOMY; PERITONEAL WASHINGS;  Surgeon: Isabel Caprice, MD;  [facility];  Service: Gynecology;  Laterality: Bilateral;   TONSILLECTOMY     TONSILLECTOMY AND ADENOIDECTOMY  1953   Patient Active Problem List   Diagnosis Date Noted   Blepharitis of upper and lower eyelids of both eyes 08/17/2022   Subacute cough 08/17/2022   History of allergy to shellfish 02/24/2022   Chronic low back pain of over 3 months duration 02/04/2022   Osteoarthritis of  AC (acromioclavicular) joint (Left) 02/04/2022   Osteoarthritis of hip (Right) 02/04/2022   Impaction fracture of distal end of radius and ulna, sequela (Left) 02/04/2022   Osteoarthritis of 1st (thumb) carpometacarpal (CMC) joint of hand (Left) 02/04/2022   Lumbosacral facet arthropathy (Multilevel) (Bilateral) (T12-S1)  02/04/2022   Lumbar central spinal stenosis w/o neurogenic claudication (L2-3, L3-4, L4-5) 02/04/2022   Lumbar foraminal stenosis (Bilateral: L2-3, L4-5) (Right: (Severe) L4-5) 02/04/2022   Lumbar lateral recess stenosis (Bilateral: L2-3, L3-4, and L4-5) (Severe) 02/04/2022   DDD (degenerative disc disease), lumbar 02/04/2022   Lumbosacral facet syndrome (Multilevel) (Bilateral) 02/04/2022   Osteoarthritis of lumbar spine 02/04/2022   Erythema ab igne 12/23/2021   Allergic conjunctivitis of both  eyes 12/23/2021   Chronic hip pain (3ry area of Pain) (Bilateral) (L>R) 12/22/2021   Chronic lower extremity pain (2ry area of Pain) (Bilateral) (L>R) 12/22/2021   Abnormal MRI, lumbar spine (01/31/2020) 12/22/2021   Grade 1 Anterolisthesis of lumbar spine (L3/L4) & (5 mm) (L4/L5) 12/22/2021   Chronic pain syndrome 11/17/2021   High risk medication use 11/17/2021   Disorder of skeletal system 11/17/2021   Problems influencing health status 11/17/2021   Other hyperlipidemia 10/21/2021   Polypharmacy 08/12/2021   Chronic low back pain (1ry area of Pain) (Bilateral) (L>R) w/o sciatica 06/18/2021   Elevated blood pressure reading 05/23/2021   Osteopenia of neck of femur (Right) 09/06/2020   Obesity 08/29/2020   Parkinson disease 02/22/2020   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   Bipolar disorder, in full remission, most recent episode depressed ([place]) 11/08/2019   Vitamin D insufficiency 08/24/2019   Insomnia due to mental condition 05/31/2019   Bipolar I disorder, most recent episode depressed ([place]) 05/31/2019   Alcohol use disorder, moderate, in sustained remission ([place]) 05/31/2019   Elevated tumor markers 05/27/2019   GAD (generalized anxiety disorder) 04/10/2019   MDD (major depressive disorder), severe ([place]) 01/31/2019   High serum carbohydrate antigen 19-9 (CA19-9) 12/01/2018   Adjustment disorder with anxious mood 11/24/2018   Essential hypertension 11/24/2018   B12 deficiency 10/17/2018   Incisional hernia, without obstruction or gangrene 09/12/2018   Genetic testing 04/07/2018   Family history of breast cancer 03/24/2018   Family history of lung cancer 03/24/2018   Malignant neoplasm of upper-outer quadrant of right breast in female, estrogen receptor positive ([place]) 03/17/2018   Malignant neoplasm of upper-inner quadrant of right breast in female, estrogen receptor positive ([place]) 03/17/2018   History of psychosis 02/07/2018   History of insomnia 02/07/2018    Onset date: PD dx  2021  REFERRING DIAG: Parkinson's disease  THERAPY DIAG:  Dysarthria and anarthria  Rationale for Evaluation and Treatment Rehabilitation  SUBJECTIVE:   SUBJECTIVE STATEMENT: "I still feel fuzzy."  Pt accompanied by:  self  PERTINENT HISTORY: Patient is a 76 y.o. female with history of Parkinson's disease (dx 2021), anxiety, depression, psychosis, HTN, bipolar disorder, alcoholism, memory issues since 2005, hx head trauma. Referred for LSVT BIG and LOUD; known to our clinic from prior course of therapy completed in 2022.  DIAGNOSTIC FINDINGS: MRI Brain 08/08/2018: "1. No acute intracranial abnormality and largely unremarkable for age gray and white matter signal. 2. NeuroQuant volumetric analysis of the brain suggests age advanced brain volume loss, see details on [facility] PACS."  PAIN:  Are you having pain? Yes: NPRS scale: 6/10 Pain location: left hip   PATIENT GOALS communicate better  OBJECTIVE:   TODAY'S TREATMENT: Pt reports practicing over the weekend. LSVT Daily tasks with occasional min cues. Loud /a/ averaged 83 dB, duration 12.7 seconds. Max F0 for pitch glides was 306 Hz (84 dB avg), min F0 for low glides was 172 Hz (82 dB avg). Functional phrases averaged 74 dB with rare min cues. Hierarchical loudness drills at sentence level averaged 72 dB with min  cues, progressed to sentences with cognitive load, average 70 dB with occasional min cues. Spontaneous responses more consistently in low 70s today between structured tasks.     PATIENT EDUCATION: Education details: Think loud Person educated: Patient and Spouse Education method: Explanation and Verbal cues Education comprehension: verbalized understanding   HOME EXERCISE PROGRAM: To be provided next session     GOALS: Goals reviewed with patient? Yes  SHORT TERM GOALS: Target date: 10 sessions  The patient will complete Daily Tasks (Maximum duration "ah", High/Lows, and Functional Phrases) at average loudness  >/= 85 dB and with loud, good quality voice with min cues.  Baseline: Goal status: INITIAL  2.  The patient will complete Hierarchal Speech Loudness reading drills (words/phrases, sentences) at average >/= 75 dB and with loud, good quality voice with min cues. Baseline:  Goal status: INITIAL  3.  The patient will participate in 5-8 minutes conversation, maintaining average loudness of 75 dB and loud, good quality voice with min cues. Baseline:  Goal status: INITIAL   LONG TERM GOALS: Target date:   The patient will complete Daily Tasks (Maximum duration "ah", High/Lows, and Functional Phrases) at average loudness >/= 85 dB and with loud, good quality voice Baseline:  Goal status: INITIAL  2.  The patient will complete Hierarchal Speech Loudness reading drills (words/phrases, sentences, and paragraph) at average >/= 75 dB and with loud, good quality voice. Baseline:  Goal status: INITIAL  3.  The patient will participate in 15-20 minutes conversation, maintaining average loudness of >/= 75 dB and loud, good quality voice. Baseline:  Goal status: INITIAL  4.  Patient will report improved communication effectiveness as measured by Communicative Effectiveness Survey.  Baseline: 18/32 on 09/29/22 Goal status: INITIAL   ASSESSMENT:  CLINICAL IMPRESSION: Patient is a 76 y.o. female who presents with mild-moderate dysarthria characterized by reduced vocal intensity, reduced pitch variability, hoarse vocal quality, and subjectively low pitch for gender. Patient is demonstrating improved awareness of low-intensity speech, and is more consistently using increased effort in her spontaneous responses. She remains stimulable for improvements in intensity, vocal quality, and pitch range with clinician model and cues for "Loud like me," and is using this with min cues in phrase level structured tasks. I recommend course of skilled ST for LSVT-LOUD to improve vocal quality and intensity for  communicating with family and friends.   OBJECTIVE IMPAIRMENTS include dysarthria and voice disorder. These impairments are limiting patient from effectively communicating at home and in community. Factors affecting potential to achieve goals and functional outcome are ability to learn/carryover information and previous level of function.. Patient will benefit from skilled SLP services to address above impairments and improve overall function.  REHAB POTENTIAL: Good  PLAN: SLP FREQUENCY: 4x/week  SLP DURATION: 4 weeks  PLANNED INTERVENTIONS: Cueing hierachy, Functional tasks, SLP instruction and feedback, Compensatory strategies, Patient/family education, and LSVT-LOUD      Deneise Lever, MS, Actor [phone]

## 2022-10-14 ENCOUNTER — Ambulatory Visit: Payer: Medicare PPO | Admitting: Speech Pathology

## 2022-10-14 ENCOUNTER — Ambulatory Visit: Payer: Medicare PPO

## 2022-10-14 DIAGNOSIS — M79605 Pain in left leg: Secondary | ICD-10-CM | POA: Diagnosis not present

## 2022-10-14 DIAGNOSIS — M6281 Muscle weakness (generalized): Secondary | ICD-10-CM

## 2022-10-14 DIAGNOSIS — R278 Other lack of coordination: Secondary | ICD-10-CM | POA: Diagnosis not present

## 2022-10-14 DIAGNOSIS — R471 Dysarthria and anarthria: Secondary | ICD-10-CM

## 2022-10-14 DIAGNOSIS — M79604 Pain in right leg: Secondary | ICD-10-CM | POA: Diagnosis not present

## 2022-10-14 DIAGNOSIS — R262 Difficulty in walking, not elsewhere classified: Secondary | ICD-10-CM

## 2022-10-14 DIAGNOSIS — R2681 Unsteadiness on feet: Secondary | ICD-10-CM | POA: Diagnosis not present

## 2022-10-14 DIAGNOSIS — M25552 Pain in left hip: Secondary | ICD-10-CM | POA: Diagnosis not present

## 2022-10-14 DIAGNOSIS — R41841 Cognitive communication deficit: Secondary | ICD-10-CM | POA: Diagnosis not present

## 2022-10-14 NOTE — Therapy (Signed)
OUTPATIENT PHYSICAL THERAPY LSVT BIG TREATMENT   Patient Name: Kendra [name] MRN: [id number] DOB:21-Jun-1946, 76 y.o., female Today's Date: 10/14/2022  PCP: Virginia Crews, MD REFERRING PROVIDER: Virginia Crews, MD  END OF SESSION:  PT End of Session - 10/14/22 1104     Visit Number 8    Number of Visits 17    Date for PT Re-Evaluation 11/10/22    PT Start Time 1102    PT Stop Time 1200    PT Time Calculation (min) 58 min    Equipment Utilized During Treatment Gait belt    Activity Tolerance Patient tolerated treatment well;Patient limited by fatigue;No increased pain    Behavior During Therapy [facility] for tasks assessed/performed                Past Medical History:  Diagnosis Date   Anxiety    Breast cancer ([place]) 03/2018   right breast cancer at 11:00 and 1:00   Bursitis    bilateral hips and knees   Complication of anesthesia    Depression    Family history of breast cancer 03/24/2018   Family history of lung cancer 03/24/2018   History of alcohol dependence ([place]) 2007   no ETOH; resolved since 2007   History of hiatal hernia    History of psychosis 2014   due to sleep disturbance   Hypertension    Parkinson disease    Personal history of radiation therapy 06/2018-07/2018   right breast ca   PONV (postoperative nausea and vomiting)    Past Surgical History:  Procedure Laterality Date   BREAST BIOPSY Right 03/14/2018   11:00 DCIS and invasive ductal carcinoma   BREAST BIOPSY Right 03/14/2018   1:00 Invasive ductal carcinoma   BREAST LUMPECTOMY Right 04/27/2018   lumpectomy of 11 and 1:00 cancers, clear margins, negative LN   BREAST LUMPECTOMY WITH RADIOACTIVE SEED AND SENTINEL LYMPH NODE BIOPSY Right 04/27/2018   Procedure: RIGHT BREAST LUMPECTOMY WITH RADIOACTIVE SEED X 2 AND RIGHT SENTINEL LYMPH NODE BIOPSY;  Surgeon: [name], MD;  Location: [place];  Service: General;  Laterality: Right;   CATARACT EXTRACTION  W/PHACO Left 08/30/2019   Procedure: CATARACT EXTRACTION PHACO AND INTRAOCULAR LENS PLACEMENT ([place]) LEFT panoptix toric  01:20.5  12.7%  10.26;  Surgeon: Leandrew Koyanagi, MD;  Location: [place];  Service: Ophthalmology;  Laterality: Left;   CATARACT EXTRACTION W/PHACO Right 09/20/2019   Procedure: CATARACT EXTRACTION PHACO AND INTRAOCULAR LENS PLACEMENT ([facility]) RIGHT 5.71  00:36.4  15.7%;  Surgeon: Leandrew Koyanagi, MD;  Location: [place];  Service: Ophthalmology;  Laterality: Right;   LAPAROTOMY N/A 02/10/2018   Procedure: EXPLORATORY LAPAROTOMY;  Surgeon: Isabel Caprice, MD;  Location: [facility];  Service: Gynecology;  Laterality: N/A;   MASS EXCISION  01/2018   abdominal   OVARIAN CYST REMOVAL     SALPINGOOPHORECTOMY Bilateral 02/10/2018   Procedure: BILATERAL SALPINGO OOPHORECTOMY; PERITONEAL WASHINGS;  Surgeon: Isabel Caprice, MD;  Location: [facility];  Service: Gynecology;  Laterality: Bilateral;   TONSILLECTOMY     TONSILLECTOMY AND ADENOIDECTOMY  1953   Patient Active Problem List   Diagnosis Date Noted   Blepharitis of upper and lower eyelids of both eyes 08/17/2022   Subacute cough 08/17/2022   History of allergy to shellfish 02/24/2022   Chronic low back pain of over 3 months duration 02/04/2022   Osteoarthritis of  AC (acromioclavicular) joint (Left) 02/04/2022   Osteoarthritis of hip (Right) 02/04/2022   Impaction fracture  of distal end of radius and ulna, sequela (Left) 02/04/2022   Osteoarthritis of 1st (thumb) carpometacarpal [facility]) joint of hand (Left) 02/04/2022   Lumbosacral facet arthropathy (Multilevel) (Bilateral) (T12-S1) 02/04/2022   Lumbar central spinal stenosis w/o neurogenic claudication (L2-3, L3-4, L4-5) 02/04/2022   Lumbar foraminal stenosis (Bilateral: L2-3, L4-5) (Right: (Severe) L4-5) 02/04/2022   Lumbar lateral recess stenosis (Bilateral: L2-3, L3-4, and L4-5) (Severe) 02/04/2022   DDD (degenerative disc disease), lumbar  02/04/2022   Lumbosacral facet syndrome (Multilevel) (Bilateral) 02/04/2022   Osteoarthritis of lumbar spine 02/04/2022   Erythema ab igne 12/23/2021   Allergic conjunctivitis of both eyes 12/23/2021   Chronic hip pain (3ry area of Pain) (Bilateral) (L>R) 12/22/2021   Chronic lower extremity pain (2ry area of Pain) (Bilateral) (L>R) 12/22/2021   Abnormal MRI, lumbar spine (01/31/2020) 12/22/2021   Grade 1 Anterolisthesis of lumbar spine (L3/L4) & (5 mm) (L4/L5) 12/22/2021   Chronic pain syndrome 11/17/2021   High risk medication use 11/17/2021   Disorder of skeletal system 11/17/2021   Problems influencing health status 11/17/2021   Other hyperlipidemia 10/21/2021   Polypharmacy 08/12/2021   Chronic low back pain (1ry area of Pain) (Bilateral) (L>R) w/o sciatica 06/18/2021   Elevated blood pressure reading 05/23/2021   Osteopenia of neck of femur (Right) 09/06/2020   Obesity 08/29/2020   Parkinson disease 02/22/2020   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   Bipolar disorder, in full remission, most recent episode depressed ([place]) 11/08/2019   Vitamin D insufficiency 08/24/2019   Insomnia due to mental condition 05/31/2019   Bipolar I disorder, most recent episode depressed ([place]) 05/31/2019   Alcohol use disorder, moderate, in sustained remission ([place]) 05/31/2019   Elevated tumor markers 05/27/2019   GAD (generalized anxiety disorder) 04/10/2019   MDD (major depressive disorder), severe ([place]) 01/31/2019   High serum carbohydrate antigen 19-9 (CA19-9) 12/01/2018   Adjustment disorder with anxious mood 11/24/2018   Essential hypertension 11/24/2018   B12 deficiency 10/17/2018   Incisional hernia, without obstruction or gangrene 09/12/2018   Genetic testing 04/07/2018   Family history of breast cancer 03/24/2018   Family history of lung cancer 03/24/2018   Malignant neoplasm of upper-outer quadrant of right breast in female, estrogen receptor positive ([place]) 03/17/2018   Malignant neoplasm  of upper-inner quadrant of right breast in female, estrogen receptor positive ([place]) 03/17/2018   History of psychosis 02/07/2018   History of insomnia 02/07/2018    ONSET DATE: Pt diagnosed with PD in 2019  REFERRING DIAG:  G20 (ICD-10-CM) - Parkinson's disease      THERAPY DIAG:  Muscle weakness (generalized)  Other lack of coordination  Pain in left hip  Unsteadiness on feet  Difficulty in walking, not elsewhere classified  Rationale for Evaluation and Treatment: Rehabilitation  SUBJECTIVE:  SUBJECTIVE STATEMENT: Pt reports she did not do her HEP yesterday after her appointment. She reports continued L hip pain, but it has not worsened, it remains the same. She reports no falls/LOB and no other concerns.  Pain: not rated but reports pain in L hip is present Pt accompanied by: significant other self  PERTINENT HISTORY:  Pt is a pleasant 76 yo female who was diagnosed with PD in 2019. She is known to clinic and [facility], and was last seen for LSVT BIG Sept-Oct 2022. Pt with chronic pain and has become increasingly sedentary as a result exacerbating mobility difficulties due to PD. Current concerns: Pt reports L and R hip pain and back pain. Pt was being seen by pain management clinic. Pt has had injections that provided relief at the time. Pt's spouse reports her physician determined surgery was not an option for her pain so the plan is to manage it. Pt spouse thinks pain has resulted in reluctance for pt to move. Pt currently sedentary, doesn't walk much, but when she does she holds on to furniture for support due to balance problems. When pt first stands she must hold on to something due to mix of pain and/or dizziness (described as lightheadedness). Her pain can go from 3/10 to 9/10. It does  improve once she starts moving. Pt has increased difficulty with multiple activities including: getting in and out of her bed, getting in and out of her car, freezing, decreased gait speed, decreased balance and cognition, and difficulty using BUE due to tremors.  Per chart other PMH includes hx of breast cancer, HTN, MCI, insomnia, cataracts, bipolar 1, alcohol use disorder, GAD, MDD, osteopenia of neck of R femur, chronic B low back pain with radiating pain to buttock and posterior thighs and calves. Pt with multiple comorbidities, please refer to chart for full details    PAIN: from eval Are you having pain? Yes: NPRS scale: not rated currently but can reach 9/10 Pain location: L hip and L leg Pain description: pt has difficulty describing Aggravating factors: unsure Relieving factors: laying in the recliner helps, movement  PRECAUTIONS: Fall  WEIGHT BEARING RESTRICTIONS: No  FALLS: Has patient fallen in last 6 months? Yes. Number of falls 1x, leg tangled in chair leg when getting up from a chair  LIVING ENVIRONMENT: Lives with: lives with their spouse Lives in: House/apartment Stairs:  Has following equipment at home:  pt not currently using AD, holds onto walls/furniture, does have walker and SPC at home  PLOF: Independent with basic ADLs  PATIENT GOALS: improve mobility  IMAGING: via chart  07/24/22 DG BONE density " ASSESSMENT: The probability of a major osteoporotic fracture is 15.8% within the next ten years.   The probability of a hip fracture is 2.9% within the next ten years."  12/22/21 DG lumbar spine " IMPRESSION: 1. No acute displaced fracture or traumatic listhesis of the lumbar spine. 2. Interval development of grade 1 anterolisthesis of L3 on L4. Stable grade 1 anterolisthesis of L4 on L5. Appears stable on flexion extension views. 3.  Aortic Atherosclerosis (ICD10-I70.0)."  Please refer to chart for full details  OBJECTIVE: taken from eval unless  otherwise indicated  LSVT BIG EVALUATION & EXAMINATION   Neurological and Other Medical Information:   Pt known to clinic some answers via chart (2022) What were your initial symptoms of Parkinson's disease? first diagnosed 2019   Do you have tremors?  Yes: R hand   Do you have any pain? Yes: B  hip and back, refer to above    Medical Information:    Medication for Parkinson's disease: sinemet   Motor Symptoms:    When did you first start to notice changes in your movement you associate with Parkinson's disease? Recent changes following d/c from previous LSVT and HH - increasingly sedentary due to pain per spouse   What are your current symptoms? Difficulty getting out of a chair, sometimes has difficulty getting in and out of bed, difficult with walking, still dresses herself but is mod I and sometimes must sit to dress, requires assistance with bathing, needs assistance with meal prep (due to balance and difficulty using her hands), has general difficulty with sequencing tasks/activities   What do you do when you want to move the best you possibly can? Pt does not know    Has Parkinson's disease caused you to move less or be less active? Yes: feels most recent contribution is due to pain, freezing  Have you noticed if your movement is slower than it used to be? For example, walking, getting dressed, doing household chores, bathing, etc. Yes: pt spouse reports difficulty maintaining at home exercises following discharge   Have you or others noticed any changes in your posture? Yes: pt notices she doesn't always stand up straight   How many (if any) falls have you had in the last six months? 1x     Have you noticed any freezing with your movement? Yes:     Are there some activities you now need help with because of your Parkinson's disease? See above     Assessment:     MMT: deferred due to time   Donning jacket: 17 seconds Doffing jacket: 7 seconds   Opening jar: 5.7  seconds with multiple attempts   Sit to stand assessment:  5x STS: 27 seconds    Gait assessments:  10 MWT:   0.58 m/s with rollator     Functional Component movements:   Sit to stand (required) Donning jacket (arm through sleeve) Opening jar Stepping over and on to obstacle (simulate getting in car simulation) Big and tall posture (stand BIG)   Hierarchies Getting in and out of bed 2. Ambulating for distance and over uneven surfaces/obstacles with rollator or LRD    PATIENT SURVEYS:  FOTO 40 (goal 49) - filled out by spouse due to pt impaired cognition   PATIENT EDUCATION:  Education details: Pt educated throughout session about proper posture and technique with exercises. Improved exercise technique, movement at target joints, use of target muscles after min to mod verbal, visual, tactile cues. LSVT BIG interventions, continued instruction in technique with rollator  Person educated: Patient and Spouse Education method: Explanation Education comprehension: verbalized understanding   TODAY'S TREATMENT:  DATE:     LSVT BIG Treatment:  Patient seen for LSVT Daily Session Maximal Daily Exercises for facilitation/coordination of movement Sustained movements are designed to rescale the amplitude of movement output for generalization to daily functional activities.  Maximal Adapted Daily Exercises: Floor to Ceiling 8 with 10 second hold  Side to Side 8 Bilateral with 10 second hold  Forward Step and Reach 12 Bilateral  Sideways Step and Reach 12 Bilateral Backwards Step and Reach 12 Bilateral Forwards Rock and Reach 12 Bilateral Sideways Rock and Reach 12 Bilateral  Maximal Daily Exercise Comments: needs modeling for correct positions, VC/TC for big reach, increased amplitude, increased effort >60% of time. Rest breaks taken throughout due  to fatigue and for pain modulation ( pt reports no increase from baseline), however able to perform more reps overall today. Pt's pain and dizziness closely monitored throughout. Breaks taken as needed to address this    Functional Component movements:  5 reps performed of each Sit to stand (required)  2. Donning jacket (arm through sleeve) 3. Opening jar  4. Stepping over and on to obstacle (simulate getting in car simulation) - using 6" step   5. Big and tall posture (stand/sit BIG)  Hierarchies Getting in and out of bed - instruction in rolling, sidelye>sit each side of bed. Increased cuing for rolling today. 2. Ambulating for distance and over uneven surfaces/obstacles, with CGA- incorporated into gait training - pt completes approx 1x148 ft using rollator with continued instruction in use of rollator. Pt weaves around obstacles in clinic gym and steps over half-foam and balance pods. Limited today due to fatigue and dizziness, although vitals were WNL (see below).  Gait -pt completes approx 1x148 ft using rollator with continued instruction in use of rollator. Pt weaves around obstacles in clinic gym and steps over half-foam and balance pods. Limited today due to fatigue and dizziness, although vitals were WNL (see below). Dizziness improves with rest (pt with chronic dizziness, at times describes as a "fuzziness.")  BP following interventions: 139/84 mmHg HR 79 bpm    Carryover Assignment: Big step into restaurant with big posture, and open hands big when grabbing utensils.    HOME EXERCISE PROGRAM:  Pt to perform all 7 LSVT BIG  (adapted) maximal daily exercises, 5 Functional Component Tasks and big walking and carryover assignment (see above)  GOALS: Goals reviewed with patient? Yes  SHORT TERM GOALS: Target date: 10/27/2022    Patient will be independent in home exercise program to improve strength/mobility for better functional independence with ADLs. Baseline: LSVT BIG  protocol to be initiated next session Goal status: INITIAL   LONG TERM GOALS: Target date: 11/11/2022   Patient will increase FOTO score to equal to or greater than 49 to demonstrate statistically significant improvement in mobility and quality of life.  Baseline: 40 Goal status: INITIAL  2.  Patient (> 38 years old) will complete five times sit to stand test in < 15 seconds indicating decreased fall risk.  Baseline: 27 sec Goal status: INITIAL  3.  Patient will increase 10 meter walk test to >1.42ms with WFL B step-length, upright posture for >90% of test to improve ease with gait and to override hypokentic and bradykinetic movement.  Baseline: 0.58 m/s with 4WW Goal status: INITIAL  4.  Patient will reduce time to don jacket by at least 5 seconds in order to increase ease with dressing and override bradykinetic movement Baseline: 17 seconds Goal status: INITIAL  5.  Pt will demo  or report ability to open standard jar using only 1-2 attempts in order to exhibit improved functional mobility for ADLs.  Baseline: 5.7 seconds with multiple attempts to turn jar lid to open Goal status: INITIAL  ASSESSMENT:  CLINICAL IMPRESSION: Pt able to increase number of reps performed with all but two of the maximal, adapted daily exercises, indicating increased activity tolerance. Pt also able to initiate several of these interventions without needing cuing from PT. While pt shows progress, gait is significantly limited due to fatigue and dizziness (BP was taken and [facility]). PT continues to encourage use of rollator for safety and to improve ease with gait. PT also instructed pt and her spouse for pt to wear sneakers to improve stability with gait (pt wearing sandals with heel). They verbalized understanding. The pt will benefit from further LSVT BIG PT in order to address these deficits to improve overall functional mobility, QOL and to decrease fall risk.  OBJECTIVE IMPAIRMENTS: Abnormal gait,  decreased balance, decreased cognition, decreased coordination, decreased endurance, decreased knowledge of use of DME, decreased mobility, difficulty walking, decreased strength, dizziness, hypomobility, impaired UE functional use, improper body mechanics, postural dysfunction, and pain.   ACTIVITY LIMITATIONS: lifting, bending, standing, squatting, stairs, transfers, bed mobility, bathing, dressing, hygiene/grooming, and locomotion level  PARTICIPATION LIMITATIONS: meal prep, cleaning, laundry, medication management, personal finances, driving, shopping, community activity, and yard work  PERSONAL FACTORS: Age, Fitness, Sex, Time since onset of injury/illness/exacerbation, and 3+ comorbidities: PMH includes hx of breast cancer, HTN, MCI, insomnia, cataracts, bipolar 1, alcohol use disorder, GAD, MDD, osteopenia of neck of R femur, chronic B low back pain with radiating pain to buttock and posterior thighs and calves. Pt with multiple comorbidities, please refer to chart for full details  are also affecting patient's functional outcome.   REHAB POTENTIAL: Fair    CLINICAL DECISION MAKING: Evolving/moderate complexity  EVALUATION COMPLEXITY: High  PLAN:  PT FREQUENCY: 4x/week  PT DURATION: 6 weeks  PLANNED INTERVENTIONS: Therapeutic exercises, Therapeutic activity, Neuromuscular re-education, Balance training, Gait training, Patient/Family education, Self Care, Joint mobilization, Stair training, Vestibular training, Canalith repositioning, Orthotic/Fit training, DME instructions, Wheelchair mobility training, Spinal mobilization, Cryotherapy, Moist heat, Splintting, Taping, Manual therapy, and Re-evaluation  PLAN FOR NEXT SESSION: adapted maximal daily exercises, functional component tasks, hierarchy tasks, big walking   Zollie Pee, PT 10/14/2022, 3:07 PM

## 2022-10-14 NOTE — Therapy (Signed)
OUTPATIENT SPEECH LANGUAGE PATHOLOGY LSVT LOUD TREATMENT   Patient Name: Kendra [name] MRN: [id number] DOB:Jan 17, 1946, 76 y.o., female Today's Date: 10/14/2022  PCP: Lavon Paganini, MD REFERRING PROVIDER: Tally Joe, FNP   End of Session - 10/14/22 1027     Visit Number 8    Number of Visits 17    Date for SLP Re-Evaluation 11/28/22    SLP Start Time 1000    SLP Stop Time  1100    SLP Time Calculation (min) 60 min    Activity Tolerance Patient tolerated treatment well             Past Medical History:  Diagnosis Date   Anxiety    Breast cancer ([place]) 03/2018   right breast cancer at 11:00 and 1:00   Bursitis    bilateral hips and knees   Complication of anesthesia    Depression    Family history of breast cancer 03/24/2018   Family history of lung cancer 03/24/2018   History of alcohol dependence ([place]) 2007   no ETOH; resolved since 2007   History of hiatal hernia    History of psychosis 2014   due to sleep disturbance   Hypertension    Parkinson disease    Personal history of radiation therapy 06/2018-07/2018   right breast ca   PONV (postoperative nausea and vomiting)    Past Surgical History:  Procedure Laterality Date   BREAST BIOPSY Right 03/14/2018   11:00 DCIS and invasive ductal carcinoma   BREAST BIOPSY Right 03/14/2018   1:00 Invasive ductal carcinoma   BREAST LUMPECTOMY Right 04/27/2018   lumpectomy of 11 and 1:00 cancers, clear margins, negative LN   BREAST LUMPECTOMY WITH RADIOACTIVE SEED AND SENTINEL LYMPH NODE BIOPSY Right 04/27/2018   Procedure: RIGHT BREAST LUMPECTOMY WITH RADIOACTIVE SEED X 2 AND RIGHT SENTINEL LYMPH NODE BIOPSY;  Surgeon: [name], MD;  Location: [place];  Service: General;  Laterality: Right;   CATARACT EXTRACTION W/PHACO Left 08/30/2019   Procedure: CATARACT EXTRACTION PHACO AND INTRAOCULAR LENS PLACEMENT ([place]) LEFT panoptix toric  01:20.5  12.7%  10.26;  Surgeon: Leandrew Koyanagi, MD;   Location: [place];  Service: Ophthalmology;  Laterality: Left;   CATARACT EXTRACTION W/PHACO Right 09/20/2019   Procedure: CATARACT EXTRACTION PHACO AND INTRAOCULAR LENS PLACEMENT ([facility]) RIGHT 5.71  00:36.4  15.7%;  Surgeon: Leandrew Koyanagi, MD;  Location: [place];  Service: Ophthalmology;  Laterality: Right;   LAPAROTOMY N/A 02/10/2018   Procedure: EXPLORATORY LAPAROTOMY;  Surgeon: Isabel Caprice, MD;  Location: [facility];  Service: Gynecology;  Laterality: N/A;   MASS EXCISION  01/2018   abdominal   OVARIAN CYST REMOVAL     SALPINGOOPHORECTOMY Bilateral 02/10/2018   Procedure: BILATERAL SALPINGO OOPHORECTOMY; PERITONEAL WASHINGS;  Surgeon: Isabel Caprice, MD;  Location: [facility];  Service: Gynecology;  Laterality: Bilateral;   TONSILLECTOMY     TONSILLECTOMY AND ADENOIDECTOMY  1953   Patient Active Problem List   Diagnosis Date Noted   Blepharitis of upper and lower eyelids of both eyes 08/17/2022   Subacute cough 08/17/2022   History of allergy to shellfish 02/24/2022   Chronic low back pain of over 3 months duration 02/04/2022   Osteoarthritis of  AC (acromioclavicular) joint (Left) 02/04/2022   Osteoarthritis of hip (Right) 02/04/2022   Impaction fracture of distal end of radius and ulna, sequela (Left) 02/04/2022   Osteoarthritis of 1st (thumb) carpometacarpal (CMC) joint of hand (Left) 02/04/2022   Lumbosacral facet arthropathy (Multilevel) (Bilateral) (T12-S1)  02/04/2022   Lumbar central spinal stenosis w/o neurogenic claudication (L2-3, L3-4, L4-5) 02/04/2022   Lumbar foraminal stenosis (Bilateral: L2-3, L4-5) (Right: (Severe) L4-5) 02/04/2022   Lumbar lateral recess stenosis (Bilateral: L2-3, L3-4, and L4-5) (Severe) 02/04/2022   DDD (degenerative disc disease), lumbar 02/04/2022   Lumbosacral facet syndrome (Multilevel) (Bilateral) 02/04/2022   Osteoarthritis of lumbar spine 02/04/2022   Erythema ab igne 12/23/2021   Allergic conjunctivitis of both  eyes 12/23/2021   Chronic hip pain (3ry area of Pain) (Bilateral) (L>R) 12/22/2021   Chronic lower extremity pain (2ry area of Pain) (Bilateral) (L>R) 12/22/2021   Abnormal MRI, lumbar spine (01/31/2020) 12/22/2021   Grade 1 Anterolisthesis of lumbar spine (L3/L4) & (5 mm) (L4/L5) 12/22/2021   Chronic pain syndrome 11/17/2021   High risk medication use 11/17/2021   Disorder of skeletal system 11/17/2021   Problems influencing health status 11/17/2021   Other hyperlipidemia 10/21/2021   Polypharmacy 08/12/2021   Chronic low back pain (1ry area of Pain) (Bilateral) (L>R) w/o sciatica 06/18/2021   Elevated blood pressure reading 05/23/2021   Osteopenia of neck of femur (Right) 09/06/2020   Obesity 08/29/2020   Parkinson disease 02/22/2020   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   Bipolar disorder, in full remission, most recent episode depressed ([place]) 11/08/2019   Vitamin D insufficiency 08/24/2019   Insomnia due to mental condition 05/31/2019   Bipolar I disorder, most recent episode depressed ([place]) 05/31/2019   Alcohol use disorder, moderate, in sustained remission ([place]) 05/31/2019   Elevated tumor markers 05/27/2019   GAD (generalized anxiety disorder) 04/10/2019   MDD (major depressive disorder), severe ([place]) 01/31/2019   High serum carbohydrate antigen 19-9 (CA19-9) 12/01/2018   Adjustment disorder with anxious mood 11/24/2018   Essential hypertension 11/24/2018   B12 deficiency 10/17/2018   Incisional hernia, without obstruction or gangrene 09/12/2018   Genetic testing 04/07/2018   Family history of breast cancer 03/24/2018   Family history of lung cancer 03/24/2018   Malignant neoplasm of upper-outer quadrant of right breast in female, estrogen receptor positive ([place]) 03/17/2018   Malignant neoplasm of upper-inner quadrant of right breast in female, estrogen receptor positive ([place]) 03/17/2018   History of psychosis 02/07/2018   History of insomnia 02/07/2018    Onset date: PD dx  2021  REFERRING DIAG: Parkinson's disease  THERAPY DIAG:  Dysarthria and anarthria  Rationale for Evaluation and Treatment Rehabilitation  SUBJECTIVE:   SUBJECTIVE STATEMENT: "I read this out loud last night."  Pt accompanied by:  self  PERTINENT HISTORY: Patient is a 76 y.o. female with history of Parkinson's disease (dx 2021), anxiety, depression, psychosis, HTN, bipolar disorder, alcoholism, memory issues since 2005, hx head trauma. Referred for LSVT BIG and LOUD; known to our clinic from prior course of therapy completed in 2022.  DIAGNOSTIC FINDINGS: MRI Brain 08/08/2018: "1. No acute intracranial abnormality and largely unremarkable for age gray and white matter signal. 2. NeuroQuant volumetric analysis of the brain suggests age advanced brain volume loss, see details on [facility] PACS."  PAIN:  Are you having pain? Yes: NPRS scale: 6/10 Pain location: left hip   PATIENT GOALS communicate better  OBJECTIVE:   TODAY'S TREATMENT: Pt reports practicing over the weekend. LSVT Daily tasks; pt required moderate cues today (intensity, more frequent modeling for technique). Loud /a/ averaged 83 dB, duration 11.2 seconds. Max F0 for pitch glides was 318 Hz (83 dB avg), min F0 for low glides was 166 Hz (76 dB avg, despite mod-max cues). Functional phrases averaged 74 dB with occasional  mod cues. Hierarchical loudness drills at sentence level averaged 72 dB with min cues. With cognitive load and longer sentences, average dropped to 67 dB despite usual mod cues.   PATIENT EDUCATION: Education details: if she doesn't feel too loud, she isn't loud enough Person educated: Patient and Spouse Education method: Explanation and Verbal cues Education comprehension: verbalized understanding   HOME EXERCISE PROGRAM: To be provided next session     GOALS: Goals reviewed with patient? Yes  SHORT TERM GOALS: Target date: 10 sessions  The patient will complete Daily Tasks (Maximum duration  "ah", High/Lows, and Functional Phrases) at average loudness >/= 85 dB and with loud, good quality voice with min cues.  Baseline: Goal status: INITIAL  2.  The patient will complete Hierarchal Speech Loudness reading drills (words/phrases, sentences) at average >/= 75 dB and with loud, good quality voice with min cues. Baseline:  Goal status: INITIAL  3.  The patient will participate in 5-8 minutes conversation, maintaining average loudness of 75 dB and loud, good quality voice with min cues. Baseline:  Goal status: INITIAL   LONG TERM GOALS: Target date:   The patient will complete Daily Tasks (Maximum duration "ah", High/Lows, and Functional Phrases) at average loudness >/= 85 dB and with loud, good quality voice Baseline:  Goal status: INITIAL  2.  The patient will complete Hierarchal Speech Loudness reading drills (words/phrases, sentences, and paragraph) at average >/= 75 dB and with loud, good quality voice. Baseline:  Goal status: INITIAL  3.  The patient will participate in 15-20 minutes conversation, maintaining average loudness of >/= 75 dB and loud, good quality voice. Baseline:  Goal status: INITIAL  4.  Patient will report improved communication effectiveness as measured by Communicative Effectiveness Survey.  Baseline: 18/32 on 09/29/22 Goal status: INITIAL   ASSESSMENT:  CLINICAL IMPRESSION: Patient is a 76 y.o. female who presents with mild-moderate dysarthria characterized by reduced vocal intensity, reduced pitch variability, hoarse vocal quality, and subjectively low pitch for gender. Patient is demonstrating improved awareness of low-intensity speech, and is more consistently using increased effort in her spontaneous responses. She remains stimulable for improvements in intensity, vocal quality, and pitch range with clinician model and cues for "Loud like me," however more frequent cuing necessary today than in previous 2 sessions. I recommend course of skilled  ST for LSVT-LOUD to improve vocal quality and intensity for communicating with family and friends.   OBJECTIVE IMPAIRMENTS include dysarthria and voice disorder. These impairments are limiting patient from effectively communicating at home and in community. Factors affecting potential to achieve goals and functional outcome are ability to learn/carryover information and previous level of function.. Patient will benefit from skilled SLP services to address above impairments and improve overall function.  REHAB POTENTIAL: Good  PLAN: SLP FREQUENCY: 4x/week  SLP DURATION: 4 weeks  PLANNED INTERVENTIONS: Cueing hierachy, Functional tasks, SLP instruction and feedback, Compensatory strategies, Patient/family education, and LSVT-LOUD      Deneise Lever, MS, Actor [phone]

## 2022-10-15 ENCOUNTER — Ambulatory Visit: Payer: Medicare PPO | Admitting: Speech Pathology

## 2022-10-15 ENCOUNTER — Ambulatory Visit: Payer: Medicare PPO

## 2022-10-15 DIAGNOSIS — R278 Other lack of coordination: Secondary | ICD-10-CM

## 2022-10-15 DIAGNOSIS — M79604 Pain in right leg: Secondary | ICD-10-CM | POA: Diagnosis not present

## 2022-10-15 DIAGNOSIS — R262 Difficulty in walking, not elsewhere classified: Secondary | ICD-10-CM

## 2022-10-15 DIAGNOSIS — M6281 Muscle weakness (generalized): Secondary | ICD-10-CM

## 2022-10-15 DIAGNOSIS — R471 Dysarthria and anarthria: Secondary | ICD-10-CM

## 2022-10-15 DIAGNOSIS — M25552 Pain in left hip: Secondary | ICD-10-CM | POA: Diagnosis not present

## 2022-10-15 DIAGNOSIS — R2681 Unsteadiness on feet: Secondary | ICD-10-CM | POA: Diagnosis not present

## 2022-10-15 DIAGNOSIS — M79605 Pain in left leg: Secondary | ICD-10-CM | POA: Diagnosis not present

## 2022-10-15 DIAGNOSIS — R41841 Cognitive communication deficit: Secondary | ICD-10-CM | POA: Diagnosis not present

## 2022-10-15 NOTE — Therapy (Signed)
OUTPATIENT SPEECH LANGUAGE PATHOLOGY LSVT LOUD TREATMENT   Patient Name: Kendra [name] MRN: [id number] DOB:May 17, 1946, 76 y.o., female Today's Date: 10/15/2022  PCP: Lavon Paganini, MD REFERRING PROVIDER: Tally Joe, FNP   End of Session - 10/15/22 1525     Visit Number 9    Number of Visits 17    Date for SLP Re-Evaluation 11/28/22    SLP Start Time 1105    SLP Stop Time  1203    SLP Time Calculation (min) 58 min    Activity Tolerance Patient tolerated treatment well             Past Medical History:  Diagnosis Date   Anxiety    Breast cancer ([place]) 03/2018   right breast cancer at 11:00 and 1:00   Bursitis    bilateral hips and knees   Complication of anesthesia    Depression    Family history of breast cancer 03/24/2018   Family history of lung cancer 03/24/2018   History of alcohol dependence ([place]) 2007   no ETOH; resolved since 2007   History of hiatal hernia    History of psychosis 2014   due to sleep disturbance   Hypertension    Parkinson disease    Personal history of radiation therapy 06/2018-07/2018   right breast ca   PONV (postoperative nausea and vomiting)    Past Surgical History:  Procedure Laterality Date   BREAST BIOPSY Right 03/14/2018   11:00 DCIS and invasive ductal carcinoma   BREAST BIOPSY Right 03/14/2018   1:00 Invasive ductal carcinoma   BREAST LUMPECTOMY Right 04/27/2018   lumpectomy of 11 and 1:00 cancers, clear margins, negative LN   BREAST LUMPECTOMY WITH RADIOACTIVE SEED AND SENTINEL LYMPH NODE BIOPSY Right 04/27/2018   Procedure: RIGHT BREAST LUMPECTOMY WITH RADIOACTIVE SEED X 2 AND RIGHT SENTINEL LYMPH NODE BIOPSY;  Surgeon: [name], MD;  Location: [place];  Service: General;  Laterality: Right;   CATARACT EXTRACTION W/PHACO Left 08/30/2019   Procedure: CATARACT EXTRACTION PHACO AND INTRAOCULAR LENS PLACEMENT ([place]) LEFT panoptix toric  01:20.5  12.7%  10.26;  Surgeon: Leandrew Koyanagi, MD;   Location: [place];  Service: Ophthalmology;  Laterality: Left;   CATARACT EXTRACTION W/PHACO Right 09/20/2019   Procedure: CATARACT EXTRACTION PHACO AND INTRAOCULAR LENS PLACEMENT ([facility]) RIGHT 5.71  00:36.4  15.7%;  Surgeon: Leandrew Koyanagi, MD;  Location: [place];  Service: Ophthalmology;  Laterality: Right;   LAPAROTOMY N/A 02/10/2018   Procedure: EXPLORATORY LAPAROTOMY;  Surgeon: Isabel Caprice, MD;  [facility];  Service: Gynecology;  Laterality: N/A;   MASS EXCISION  01/2018   abdominal   OVARIAN CYST REMOVAL     SALPINGOOPHORECTOMY Bilateral 02/10/2018   Procedure: BILATERAL SALPINGO OOPHORECTOMY; PERITONEAL WASHINGS;  Surgeon: Isabel Caprice, MD;  Location[facility];  Service: Gynecology;  Laterality: Bilateral;   TONSILLECTOMY     TONSILLECTOMY AND ADENOIDECTOMY  1953   Patient Active Problem List   Diagnosis Date Noted   Blepharitis of upper and lower eyelids of both eyes 08/17/2022   Subacute cough 08/17/2022   History of allergy to shellfish 02/24/2022   Chronic low back pain of over 3 months duration 02/04/2022   Osteoarthritis of  AC (acromioclavicular) joint (Left) 02/04/2022   Osteoarthritis of hip (Right) 02/04/2022   Impaction fracture of distal end of radius and ulna, sequela (Left) 02/04/2022   Osteoarthritis of 1st (thumb) carpometacarpal (CMC) joint of hand (Left) 02/04/2022   Lumbosacral facet arthropathy (Multilevel) (Bilateral) (T12-S1)  02/04/2022   Lumbar central spinal stenosis w/o neurogenic claudication (L2-3, L3-4, L4-5) 02/04/2022   Lumbar foraminal stenosis (Bilateral: L2-3, L4-5) (Right: (Severe) L4-5) 02/04/2022   Lumbar lateral recess stenosis (Bilateral: L2-3, L3-4, and L4-5) (Severe) 02/04/2022   DDD (degenerative disc disease), lumbar 02/04/2022   Lumbosacral facet syndrome (Multilevel) (Bilateral) 02/04/2022   Osteoarthritis of lumbar spine 02/04/2022   Erythema ab igne 12/23/2021   Allergic conjunctivitis of both  eyes 12/23/2021   Chronic hip pain (3ry area of Pain) (Bilateral) (L>R) 12/22/2021   Chronic lower extremity pain (2ry area of Pain) (Bilateral) (L>R) 12/22/2021   Abnormal MRI, lumbar spine (01/31/2020) 12/22/2021   Grade 1 Anterolisthesis of lumbar spine (L3/L4) & (5 mm) (L4/L5) 12/22/2021   Chronic pain syndrome 11/17/2021   High risk medication use 11/17/2021   Disorder of skeletal system 11/17/2021   Problems influencing health status 11/17/2021   Other hyperlipidemia 10/21/2021   Polypharmacy 08/12/2021   Chronic low back pain (1ry area of Pain) (Bilateral) (L>R) w/o sciatica 06/18/2021   Elevated blood pressure reading 05/23/2021   Osteopenia of neck of femur (Right) 09/06/2020   Obesity 08/29/2020   Parkinson disease 02/22/2020   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   Bipolar disorder, in full remission, most recent episode depressed ([place]) 11/08/2019   Vitamin D insufficiency 08/24/2019   Insomnia due to mental condition 05/31/2019   Bipolar I disorder, most recent episode depressed ([place]) 05/31/2019   Alcohol use disorder, moderate, in sustained remission ([place]) 05/31/2019   Elevated tumor markers 05/27/2019   GAD (generalized anxiety disorder) 04/10/2019   MDD (major depressive disorder), severe ([place]) 01/31/2019   High serum carbohydrate antigen 19-9 (CA19-9) 12/01/2018   Adjustment disorder with anxious mood 11/24/2018   Essential hypertension 11/24/2018   B12 deficiency 10/17/2018   Incisional hernia, without obstruction or gangrene 09/12/2018   Genetic testing 04/07/2018   Family history of breast cancer 03/24/2018   Family history of lung cancer 03/24/2018   Malignant neoplasm of upper-outer quadrant of right breast in female, estrogen receptor positive ([place]) 03/17/2018   Malignant neoplasm of upper-inner quadrant of right breast in female, estrogen receptor positive ([place]) 03/17/2018   History of psychosis 02/07/2018   History of insomnia 02/07/2018    Onset date: PD dx  2021  REFERRING DIAG: Parkinson's disease  THERAPY DIAG:  Dysarthria and anarthria  Rationale for Evaluation and Treatment Rehabilitation  SUBJECTIVE:   SUBJECTIVE STATEMENT: Pt greeted SLP in loud voice.  Pt accompanied by:  self  PERTINENT HISTORY: Patient is a 76 y.o. female with history of Parkinson's disease (dx 2021), anxiety, depression, psychosis, HTN, bipolar disorder, alcoholism, memory issues since 2005, hx head trauma. Referred for LSVT BIG and LOUD; known to our clinic from prior course of therapy completed in 2022.  DIAGNOSTIC FINDINGS: MRI Brain 08/08/2018: "1. No acute intracranial abnormality and largely unremarkable for age gray and white matter signal. 2. NeuroQuant volumetric analysis of the brain suggests age advanced brain volume loss, see details on [facility] PACS."  PAIN:  Are you having pain? Yes: NPRS scale: 6/10 Pain location: left hip   PATIENT GOALS communicate better  OBJECTIVE:   TODAY'S TREATMENT: During LSVT Daily tasks; pt required min cues for increased vocal intensity. Loud /a/ averaged 84 dB, avg duration 14.3 seconds. Max F0 for pitch glides was 321 Hz (84 dB avg), min F0 for low glides was 169 Hz (82 dB avg; reduced need for cues in this task today). Functional phrases averaged 75 dB with rare min cues. Hierarchical  loudness drills at sentence level with cognitive load averaged 72 dB with min cues. For paragraph reading, pt maintained average of 70 dB over 3 minutes, but this reduced to 67 dB with more prolonged reading  PATIENT EDUCATION: Education details: loud voice sounds just right Person educated: Patient and Spouse Education method: Explanation and Verbal cues Education comprehension: verbalized understanding   HOME EXERCISE PROGRAM: To be provided next session     GOALS: Goals reviewed with patient? Yes  SHORT TERM GOALS: Target date: 10 sessions  The patient will complete Daily Tasks (Maximum duration "ah", High/Lows, and  Functional Phrases) at average loudness >/= 85 dB and with loud, good quality voice with min cues.  Baseline: Goal status: INITIAL  2.  The patient will complete Hierarchal Speech Loudness reading drills (words/phrases, sentences) at average >/= 75 dB and with loud, good quality voice with min cues. Baseline:  Goal status: INITIAL  3.  The patient will participate in 5-8 minutes conversation, maintaining average loudness of 75 dB and loud, good quality voice with min cues. Baseline:  Goal status: INITIAL   LONG TERM GOALS: Target date:   The patient will complete Daily Tasks (Maximum duration "ah", High/Lows, and Functional Phrases) at average loudness >/= 85 dB and with loud, good quality voice Baseline:  Goal status: INITIAL  2.  The patient will complete Hierarchal Speech Loudness reading drills (words/phrases, sentences, and paragraph) at average >/= 75 dB and with loud, good quality voice. Baseline:  Goal status: INITIAL  3.  The patient will participate in 15-20 minutes conversation, maintaining average loudness of >/= 75 dB and loud, good quality voice. Baseline:  Goal status: INITIAL  4.  Patient will report improved communication effectiveness as measured by Communicative Effectiveness Survey.  Baseline: 18/32 on 09/29/22 Goal status: INITIAL   ASSESSMENT:  CLINICAL IMPRESSION: Patient is a 76 y.o. female who presents with mild-moderate dysarthria characterized by reduced vocal intensity, reduced pitch variability, hoarse vocal quality, and subjectively low pitch for gender. Patient is demonstrating improved awareness of low-intensity speech, and is more consistently using increased effort in her spontaneous responses. She remains stimulable for improvements in intensity, vocal quality, and pitch range with clinician model and cues for "Loud like me," with varying need for cues. Position of ST in pt's schedule may also impact greater success due to carryover effects  suspected after completing physical therapy (as well as improved alertness). I recommend course of skilled ST for LSVT-LOUD to improve vocal quality and intensity for communicating with family and friends.   OBJECTIVE IMPAIRMENTS include dysarthria and voice disorder. These impairments are limiting patient from effectively communicating at home and in community. Factors affecting potential to achieve goals and functional outcome are ability to learn/carryover information and previous level of function.. Patient will benefit from skilled SLP services to address above impairments and improve overall function.  REHAB POTENTIAL: Good  PLAN: SLP FREQUENCY: 4x/week  SLP DURATION: 4 weeks  PLANNED INTERVENTIONS: Cueing hierachy, Functional tasks, SLP instruction and feedback, Compensatory strategies, Patient/family education, and LSVT-LOUD      Deneise Lever, MS, Actor [phone]

## 2022-10-15 NOTE — Therapy (Signed)
OUTPATIENT PHYSICAL THERAPY LSVT BIG TREATMENT   Patient Name: Kendra [name] MRN: [id number] DOB:05-22-1946, 76 y.o., female Today's Date: 10/15/2022  PCP: Virginia Crews, MD REFERRING PROVIDER: Virginia Crews, MD  END OF SESSION:  PT End of Session - 10/15/22 1319     Visit Number 9    Number of Visits 17    Date for PT Re-Evaluation 11/10/22    PT Start Time 1001    PT Stop Time 1056    PT Time Calculation (min) 55 min    Equipment Utilized During Treatment Gait belt    Activity Tolerance Patient tolerated treatment well;Patient limited by fatigue;No increased pain    Behavior During Therapy [facility] for tasks assessed/performed                Past Medical History:  Diagnosis Date   Anxiety    Breast cancer ([place]) 03/2018   right breast cancer at 11:00 and 1:00   Bursitis    bilateral hips and knees   Complication of anesthesia    Depression    Family history of breast cancer 03/24/2018   Family history of lung cancer 03/24/2018   History of alcohol dependence ([place]) 2007   no ETOH; resolved since 2007   History of hiatal hernia    History of psychosis 2014   due to sleep disturbance   Hypertension    Parkinson disease    Personal history of radiation therapy 06/2018-07/2018   right breast ca   PONV (postoperative nausea and vomiting)    Past Surgical History:  Procedure Laterality Date   BREAST BIOPSY Right 03/14/2018   11:00 DCIS and invasive ductal carcinoma   BREAST BIOPSY Right 03/14/2018   1:00 Invasive ductal carcinoma   BREAST LUMPECTOMY Right 04/27/2018   lumpectomy of 11 and 1:00 cancers, clear margins, negative LN   BREAST LUMPECTOMY WITH RADIOACTIVE SEED AND SENTINEL LYMPH NODE BIOPSY Right 04/27/2018   Procedure: RIGHT BREAST LUMPECTOMY WITH RADIOACTIVE SEED X 2 AND RIGHT SENTINEL LYMPH NODE BIOPSY;  Surgeon: [name], MD;  Location: [place];  Service: General;  Laterality: Right;   CATARACT EXTRACTION  W/PHACO Left 08/30/2019   Procedure: CATARACT EXTRACTION PHACO AND INTRAOCULAR LENS PLACEMENT ([place]) LEFT panoptix toric  01:20.5  12.7%  10.26;  Surgeon: Leandrew Koyanagi, MD;  Location: [place];  Service: Ophthalmology;  Laterality: Left;   CATARACT EXTRACTION W/PHACO Right 09/20/2019   Procedure: CATARACT EXTRACTION PHACO AND INTRAOCULAR LENS PLACEMENT ([facility]) RIGHT 5.71  00:36.4  15.7%;  Surgeon: Leandrew Koyanagi, MD;  Location: [place];  Service: Ophthalmology;  Laterality: Right;   LAPAROTOMY N/A 02/10/2018   Procedure: EXPLORATORY LAPAROTOMY;  Surgeon: Isabel Caprice, MD;  Location: [facility];  Service: Gynecology;  Laterality: N/A;   MASS EXCISION  01/2018   abdominal   OVARIAN CYST REMOVAL     SALPINGOOPHORECTOMY Bilateral 02/10/2018   Procedure: BILATERAL SALPINGO OOPHORECTOMY; PERITONEAL WASHINGS;  Surgeon: Isabel Caprice, MD;  Location: [facility];  Service: Gynecology;  Laterality: Bilateral;   TONSILLECTOMY     TONSILLECTOMY AND ADENOIDECTOMY  1953   Patient Active Problem List   Diagnosis Date Noted   Blepharitis of upper and lower eyelids of both eyes 08/17/2022   Subacute cough 08/17/2022   History of allergy to shellfish 02/24/2022   Chronic [name] back pain of over 3 months duration 02/04/2022   Osteoarthritis of  AC (acromioclavicular) joint (Left) 02/04/2022   Osteoarthritis of hip (Right) 02/04/2022   Impaction fracture  of distal end of radius and ulna, sequela (Left) 02/04/2022   Osteoarthritis of 1st (thumb) [facility]) joint of hand (Left) 02/04/2022   Lumbosacral facet arthropathy (Multilevel) (Bilateral) (T12-S1) 02/04/2022   Lumbar central spinal stenosis w/o neurogenic claudication (L2-3, L3-4, L4-5) 02/04/2022   Lumbar foraminal stenosis (Bilateral: L2-3, L4-5) (Right: (Severe) L4-5) 02/04/2022   Lumbar lateral recess stenosis (Bilateral: L2-3, L3-4, and L4-5) (Severe) 02/04/2022   DDD (degenerative disc disease), lumbar  02/04/2022   Lumbosacral facet syndrome (Multilevel) (Bilateral) 02/04/2022   Osteoarthritis of lumbar spine 02/04/2022   Erythema ab igne 12/23/2021   Allergic conjunctivitis of both eyes 12/23/2021   Chronic hip pain (3ry area of Pain) (Bilateral) (L>R) 12/22/2021   Chronic lower extremity pain (2ry area of Pain) (Bilateral) (L>R) 12/22/2021   Abnormal MRI, lumbar spine (01/31/2020) 12/22/2021   Grade 1 Anterolisthesis of lumbar spine (L3/L4) & (5 mm) (L4/L5) 12/22/2021   Chronic pain syndrome 11/17/2021   High risk medication use 11/17/2021   Disorder of skeletal system 11/17/2021   Problems influencing health status 11/17/2021   Other hyperlipidemia 10/21/2021   Polypharmacy 08/12/2021   Chronic [name] back pain (1ry area of Pain) (Bilateral) (L>R) w/o sciatica 06/18/2021   Elevated blood pressure reading 05/23/2021   Osteopenia of neck of femur (Right) 09/06/2020   Obesity 08/29/2020   Parkinson disease 02/22/2020   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   Bipolar disorder, in full remission, most recent episode depressed ([place]) 11/08/2019   Vitamin D insufficiency 08/24/2019   Insomnia due to mental condition 05/31/2019   Bipolar I disorder, most recent episode depressed ([place]) 05/31/2019   Alcohol use disorder, moderate, in sustained remission ([place]) 05/31/2019   Elevated tumor markers 05/27/2019   GAD (generalized anxiety disorder) 04/10/2019   MDD (major depressive disorder), severe ([place]) 01/31/2019   High serum carbohydrate antigen 19-9 (CA19-9) 12/01/2018   Adjustment disorder with anxious mood 11/24/2018   Essential hypertension 11/24/2018   B12 deficiency 10/17/2018   Incisional hernia, without obstruction or gangrene 09/12/2018   Genetic testing 04/07/2018   Family history of breast cancer 03/24/2018   Family history of lung cancer 03/24/2018   Malignant neoplasm of upper-outer quadrant of right breast in female, estrogen receptor positive ([place]) 03/17/2018   Malignant neoplasm  of upper-inner quadrant of right breast in female, estrogen receptor positive ([place]) 03/17/2018   History of psychosis 02/07/2018   History of insomnia 02/07/2018    ONSET DATE: Pt diagnosed with PD in 2019  REFERRING DIAG:  G20 (ICD-10-CM) - Parkinson's disease      THERAPY DIAG:  Muscle weakness (generalized)  Other lack of coordination  Difficulty in walking, not elsewhere classified  Pain in left hip  Rationale for Evaluation and Treatment: Rehabilitation  SUBJECTIVE:  SUBJECTIVE STATEMENT: Pt reports she did not do her HEP yesterday.  She reports she took pain medication prior to appointment. Continues to have L hip pain not worse or better.  Pain: not rated but reports pain in L hip is present Pt accompanied by: significant other self  PERTINENT HISTORY:  Pt is a pleasant 76 yo female who was diagnosed with PD in 2019. She is known to clinic and [facility], and was last seen for LSVT BIG Sept-Oct 2022. Pt with chronic pain and has become increasingly sedentary as a result exacerbating mobility difficulties due to PD. Current concerns: Pt reports L and R hip pain and back pain. Pt was being seen by pain management clinic. Pt has had injections that provided relief at the time. Pt's spouse reports her physician determined surgery was not an option for her pain so the plan is to manage it. Pt spouse thinks pain has resulted in reluctance for pt to move. Pt currently sedentary, doesn't walk much, but when she does she holds on to furniture for support due to balance problems. When pt first stands she must hold on to something due to mix of pain and/or dizziness (described as lightheadedness). Her pain can go from 3/10 to 9/10. It does improve once she starts moving. Pt has increased difficulty with  multiple activities including: getting in and out of her bed, getting in and out of her car, freezing, decreased gait speed, decreased balance and cognition, and difficulty using BUE due to tremors.  Per chart other PMH includes hx of breast cancer, HTN, MCI, insomnia, cataracts, bipolar 1, alcohol use disorder, GAD, MDD, osteopenia of neck of R femur, chronic B [name] back pain with radiating pain to buttock and posterior thighs and calves. Pt with multiple comorbidities, please refer to chart for full details    PAIN: from eval Are you having pain? Yes: NPRS scale: not rated currently but can reach 9/10 Pain location: L hip and L leg Pain description: pt has difficulty describing Aggravating factors: unsure Relieving factors: laying in the recliner helps, movement  PRECAUTIONS: Fall  WEIGHT BEARING RESTRICTIONS: No  FALLS: Has patient fallen in last 6 months? Yes. Number of falls 1x, leg tangled in chair leg when getting up from a chair  LIVING ENVIRONMENT: Lives with: lives with their spouse Lives in: House/apartment Stairs:  Has following equipment at home:  pt not currently using AD, holds onto walls/furniture, does have walker and SPC at home  PLOF: Independent with basic ADLs  PATIENT GOALS: improve mobility  IMAGING: via chart  07/24/22 DG BONE density " ASSESSMENT: The probability of a major osteoporotic fracture is 15.8% within the next ten years.   The probability of a hip fracture is 2.9% within the next ten years."  12/22/21 DG lumbar spine " IMPRESSION: 1. No acute displaced fracture or traumatic listhesis of the lumbar spine. 2. Interval development of grade 1 anterolisthesis of L3 on L4. Stable grade 1 anterolisthesis of L4 on L5. Appears stable on flexion extension views. 3.  Aortic Atherosclerosis (ICD10-I70.0)."  Please refer to chart for full details  OBJECTIVE: taken from eval unless otherwise indicated  LSVT BIG EVALUATION & EXAMINATION    Neurological and Other Medical Information:   Pt known to clinic some answers via chart (2022) What were your initial symptoms of Parkinson's disease? first diagnosed 2019   Do you have tremors?  Yes: R hand   Do you have any pain? Yes: B hip and back, refer to above  Medical Information:    Medication for Parkinson's disease: sinemet   Motor Symptoms:    When did you first start to notice changes in your movement you associate with Parkinson's disease? Recent changes following d/c from previous LSVT and HH - increasingly sedentary due to pain per spouse   What are your current symptoms? Difficulty getting out of a chair, sometimes has difficulty getting in and out of bed, difficult with walking, still dresses herself but is mod I and sometimes must sit to dress, requires assistance with bathing, needs assistance with meal prep (due to balance and difficulty using her hands), has general difficulty with sequencing tasks/activities   What do you do when you want to move the best you possibly can? Pt does not know    Has Parkinson's disease caused you to move less or be less active? Yes: feels most recent contribution is due to pain, freezing  Have you noticed if your movement is slower than it used to be? For example, walking, getting dressed, doing household chores, bathing, etc. Yes: pt spouse reports difficulty maintaining at home exercises following discharge   Have you or others noticed any changes in your posture? Yes: pt notices she doesn't always stand up straight   How many (if any) falls have you had in the last six months? 1x     Have you noticed any freezing with your movement? Yes:     Are there some activities you now need help with because of your Parkinson's disease? See above     Assessment:     MMT: deferred due to time   Donning jacket: 17 seconds Doffing jacket: 7 seconds   Opening jar: 5.7 seconds with multiple attempts   Sit to stand assessment:   5x STS: 27 seconds    Gait assessments:  10 MWT:   0.58 m/s with rollator     Functional Component movements:   Sit to stand (required) Donning jacket (arm through sleeve) Opening jar Stepping over and on to obstacle (simulate getting in car simulation) Big and tall posture (stand BIG)   Hierarchies Getting in and out of bed 2. Ambulating for distance and over uneven surfaces/obstacles with rollator or LRD    PATIENT SURVEYS:  FOTO 40 (goal 49) - filled out by spouse due to pt impaired cognition   PATIENT EDUCATION:  Education details: Pt educated throughout session about proper posture and technique with exercises. Improved exercise technique, movement at target joints, use of target muscles after min to mod verbal, visual, tactile cues. LSVT BIG interventions, continued instruction in technique with rollator  Person educated: Patient and Spouse Education method: Explanation Education comprehension: verbalized understanding   TODAY'S TREATMENT:                                                                                                                                         DATE:  LSVT BIG Treatment:  Patient seen for LSVT Daily Session Maximal Daily Exercises for facilitation/coordination of movement Sustained movements are designed to rescale the amplitude of movement output for generalization to daily functional activities.  Maximal Adapted Daily Exercises: Floor to Ceiling 8 with 10 second hold  Side to Side 8 Bilateral with 10 second hold  Forward Step and Reach 12 Bilateral  Sideways Step and Reach 12 Bilateral  BP LUE, seated: 129/62 mmHg, HR 82 bpm  Backwards Step and Reach 12 Bilateral Forwards Rock and Reach 12 Bilateral Sideways Rock and Reach 12 Bilateral  Maximal Daily Exercise Comments: needs modeling for correct positions, VC/TC for big reach, increased amplitude, increased effort >40% of time. Rest breaks taken throughout due to fatigue and  for pain modulation ( pt reports no increase from baseline). Pt's pain and dizziness closely monitored throughout. Breaks taken as needed to address this. Pt able to perform maximal daily exercises with correct technique and less cuing today.    Functional Component movements:  5 reps performed of each Sit to stand (required)  2. Donning jacket (arm through sleeve) 3. Opening jar  4. Stepping over and on to obstacle (simulate getting in car simulation) - using 6" step   5. Big and tall posture (stand/sit BIG)  Hierarchies Getting in and out of bed - instruction in rolling, sidelye>sit each side of bed. Continued increased cuing for rolling. 2. Ambulating for distance and over uneven surfaces/obstacles, with CGA- incorporated into gait training - pt completes approx 4x10 meters using rollator with continued instruction in use of rollator, upright posture, and increased step length, proximity to walker. Pt weaves around obstacles in clinic gym and steps over half-foam and balance podsx2. No dizziness reported with intervention day.  Gait -pt completes approx 4x10 meters using rollator with continued instruction in use of rollator, upright posture, and increased step length, proximity to walker. Pt weaves around obstacles in clinic gym and steps over half-foam and balance podsx2. No dizziness reported with intervention day. Pt attempted gait with closed-toed shows, however, exhibited decreased steadiness and PT assisted pt in donning her typical footwear (sandals). Pt much steadier with sandals donned.    Carryover Assignment: Big posture all weekend and for remainder of week along with use of big STS technique    HOME EXERCISE PROGRAM:  Pt to perform all 7 LSVT BIG  (adapted) maximal daily exercises, 5 Functional Component Tasks and big walking and carryover assignment (see above)  GOALS: Goals reviewed with patient? Yes  SHORT TERM GOALS: Target date: 10/27/2022    Patient will be  independent in home exercise program to improve strength/mobility for better functional independence with ADLs. Baseline: LSVT BIG protocol to be initiated next session Goal status: INITIAL   LONG TERM GOALS: Target date: 11/11/2022   Patient will increase FOTO score to equal to or greater than 49 to demonstrate statistically significant improvement in mobility and quality of life.  Baseline: 40 Goal status: INITIAL  2.  Patient (> 64 years old) will complete five times sit to stand test in < 15 seconds indicating decreased fall risk.  Baseline: 27 sec Goal status: INITIAL  3.  Patient will increase 10 meter walk test to >1.30ms with WFL B step-length, upright posture for >90% of test to improve ease with gait and to override hypokentic and bradykinetic movement.  Baseline: 0.58 m/s with 4WW Goal status: INITIAL  4.  Patient will reduce time to don jacket by at least 5 seconds in order to  increase ease with dressing and override bradykinetic movement Baseline: 17 seconds Goal status: INITIAL  5.  Pt will demo or report ability to open standard jar using only 1-2 attempts in order to exhibit improved functional mobility for ADLs.  Baseline: 5.7 seconds with multiple attempts to turn jar lid to open Goal status: INITIAL  ASSESSMENT:  CLINICAL IMPRESSION: Pt brings closed-toed shoes to session, but exhibits decreased postural stability with them on when attempted gait, and PT re-donned pt's typical shoes for her with improved balance noted following this change. Pt with functional carryover between sessions, requiring around only 40% cuing for correct technique with maximal daily exercises. Her pain remains at baseline with interventions, but does continue to limit her mobility. The pt will benefit from further LSVT BIG PT in order to address these deficits to improve overall functional mobility, QOL and to decrease fall risk.  OBJECTIVE IMPAIRMENTS: Abnormal gait, decreased balance,  decreased cognition, decreased coordination, decreased endurance, decreased knowledge of use of DME, decreased mobility, difficulty walking, decreased strength, dizziness, hypomobility, impaired UE functional use, improper body mechanics, postural dysfunction, and pain.   ACTIVITY LIMITATIONS: lifting, bending, standing, squatting, stairs, transfers, bed mobility, bathing, dressing, hygiene/grooming, and locomotion level  PARTICIPATION LIMITATIONS: meal prep, cleaning, laundry, medication management, personal finances, driving, shopping, community activity, and yard work  PERSONAL FACTORS: Age, Fitness, Sex, Time since onset of injury/illness/exacerbation, and 3+ comorbidities: PMH includes hx of breast cancer, HTN, MCI, insomnia, cataracts, bipolar 1, alcohol use disorder, GAD, MDD, osteopenia of neck of R femur, chronic B [name] back pain with radiating pain to buttock and posterior thighs and calves. Pt with multiple comorbidities, please refer to chart for full details  are also affecting patient's functional outcome.   REHAB POTENTIAL: Fair    CLINICAL DECISION MAKING: Evolving/moderate complexity  EVALUATION COMPLEXITY: High  PLAN:  PT FREQUENCY: 4x/week  PT DURATION: 6 weeks  PLANNED INTERVENTIONS: Therapeutic exercises, Therapeutic activity, Neuromuscular re-education, Balance training, Gait training, Patient/Family education, Self Care, Joint mobilization, Stair training, Vestibular training, Canalith repositioning, Orthotic/Fit training, DME instructions, Wheelchair mobility training, Spinal mobilization, Cryotherapy, Moist heat, Splintting, Taping, Manual therapy, and Re-evaluation  PLAN FOR NEXT SESSION: adapted maximal daily exercises, functional component tasks, hierarchy tasks, big walking   Zollie Pee, PT 10/15/2022, 1:27 PM

## 2022-10-16 NOTE — Progress Notes (Deleted)
Established patient visit   Patient: Kendra [name]   DOB: 02/22/1946   76 y.o. Female  MRN: [id number] Visit Date: 10/19/2022  Today's healthcare provider: Lavon Paganini, MD   No chief complaint on file.  Subjective    HPI  Hypertension, follow-up  BP Readings from Last 3 Encounters:  09/10/22 115/68  08/28/22 (!) 144/84  08/25/22 115/74   Wt Readings from Last 3 Encounters:  09/10/22 222 lb (100.7 kg)  08/28/22 223 lb (101.2 kg)  08/25/22 224 lb (101.6 kg)     She was last seen for hypertension 6 months ago.  BP at that visit was 120/74. Management since that visit includes no changes.  She reports {excellent/good/fair/poor:19665} compliance with treatment. She {is/is not:9024} having side effects. {document side effects if present:1}  Outside blood pressures are {***enter patient reported home BP readings, or 'not being checked':1}.  Pertinent labs Lab Results  Component Value Date   CHOL 190 12/17/2021   HDL 58 12/17/2021   LDLCALC 113 (H) 12/17/2021   TRIG 105 12/17/2021   CHOLHDL 3.3 12/17/2021   Lab Results  Component Value Date   NA 137 08/28/2022   K 3.5 08/28/2022   CREATININE 0.83 08/28/2022   GFRNONAA >60 08/28/2022   GLUCOSE 162 (H) 08/28/2022   TSH 2.31 04/13/2019     The 10-year ASCVD risk score (Arnett DK, et al., 2019) is: 17.3%  --------------------------------------------------------------------------------------------------- Lipid/Cholesterol, Follow-up  Last lipid panel Other pertinent labs  Lab Results  Component Value Date   CHOL 190 12/17/2021   HDL 58 12/17/2021   LDLCALC 113 (H) 12/17/2021   TRIG 105 12/17/2021   CHOLHDL 3.3 12/17/2021   Lab Results  Component Value Date   ALT 10 08/28/2022   AST 24 08/28/2022   PLT 240 08/28/2022   TSH 2.31 04/13/2019     She was last seen for this 6 months ago.  Management since that visit includes no changes.  She reports {excellent/good/fair/poor:19665} compliance  with treatment. She {is/is not:9024} having side effects. {document side effects if present:1}  The 10-year ASCVD risk score (Arnett DK, et al., 2019) is: 17.3%  ---------------------------------------------------------------------------------------------------   Medications: Outpatient Medications Prior to Visit  Medication Sig   Acetaminophen 500 MG capsule Take 1,000 mg by mouth every 6 (six) hours as needed.    carbidopa-levodopa (SINEMET IR) 25-100 MG tablet Take 1 tablet by mouth daily in the afternoon. In the morning   cefpodoxime (VANTIN) 200 MG tablet Take 1 tablet (200 mg total) by mouth 2 (two) times daily.   cyanocobalamin (,VITAMIN B-12,) 1000 MCG/ML injection INJECT 1ML INTO THE MUSCLE EVERY 30 DAYS   denosumab (PROLIA) 60 MG/ML SOSY injection Inject 60 mg into the skin every 6 (six) months. takes annually   diclofenac Sodium (VOLTAREN) 1 % GEL Apply 4 g topically 4 (four) times daily as needed.   FLUoxetine HCl 60 MG TABS Take 1 tablet by mouth daily.   gabapentin (NEURONTIN) 100 MG capsule Take 3 capsules (300 mg total) by mouth 3 (three) times daily. (Patient taking differently: Take 200 mg by mouth 3 (three) times daily.)   letrozole (FEMARA) 2.5 MG tablet Take 1 tablet (2.5 mg total) by mouth daily.   lisinopril-hydrochlorothiazide (ZESTORETIC) 20-12.5 MG tablet TAKE 1 TABLET BY MOUTH DAILY   meloxicam (MOBIC) 15 MG tablet Take 1 tablet (15 mg total) by mouth daily.   nystatin (NYAMYC) powder APPLY TOPICALLY TO AFFECTED AREA(S) THREE TIMES DAILY   polyethylene glycol (MIRALAX /  GLYCOLAX) 17 g packet Take 17 g by mouth daily.   risperiDONE (RISPERDAL) 0.5 MG tablet Take 1 tablet (0.5 mg total) by mouth 2 (two) times daily.   traMADol (ULTRAM) 50 MG tablet Take by mouth.   traZODone (DESYREL) 100 MG tablet TAKE ONE TABLET BY MOUTH AT BEDTIME   No facility-administered medications prior to visit.    Review of Systems  {Labs  Heme  Chem  Endocrine  Serology   Results Review (optional):23779}   Objective    There were no vitals taken for this visit. {Show previous vital signs (optional):23777}  Physical Exam  ***  No results found for any visits on 10/19/22.  Assessment & Plan     ***  No follow-ups on file.      {provider attestation***:1}   Lavon Paganini, MD  [facility] [phone] (phone) [phone] (fax)  [place]

## 2022-10-19 ENCOUNTER — Ambulatory Visit: Payer: Medicare PPO | Admitting: Speech Pathology

## 2022-10-19 ENCOUNTER — Ambulatory Visit: Payer: Medicare PPO

## 2022-10-19 ENCOUNTER — Ambulatory Visit: Payer: Medicare PPO | Admitting: Family Medicine

## 2022-10-19 DIAGNOSIS — R278 Other lack of coordination: Secondary | ICD-10-CM | POA: Diagnosis not present

## 2022-10-19 DIAGNOSIS — E7849 Other hyperlipidemia: Secondary | ICD-10-CM

## 2022-10-19 DIAGNOSIS — M79605 Pain in left leg: Secondary | ICD-10-CM | POA: Diagnosis not present

## 2022-10-19 DIAGNOSIS — R471 Dysarthria and anarthria: Secondary | ICD-10-CM

## 2022-10-19 DIAGNOSIS — M79604 Pain in right leg: Secondary | ICD-10-CM | POA: Diagnosis not present

## 2022-10-19 DIAGNOSIS — R2681 Unsteadiness on feet: Secondary | ICD-10-CM | POA: Diagnosis not present

## 2022-10-19 DIAGNOSIS — M6281 Muscle weakness (generalized): Secondary | ICD-10-CM

## 2022-10-19 DIAGNOSIS — R262 Difficulty in walking, not elsewhere classified: Secondary | ICD-10-CM | POA: Diagnosis not present

## 2022-10-19 DIAGNOSIS — I10 Essential (primary) hypertension: Secondary | ICD-10-CM

## 2022-10-19 DIAGNOSIS — M25552 Pain in left hip: Secondary | ICD-10-CM

## 2022-10-19 DIAGNOSIS — R41841 Cognitive communication deficit: Secondary | ICD-10-CM | POA: Diagnosis not present

## 2022-10-19 NOTE — Therapy (Signed)
OUTPATIENT SPEECH LANGUAGE PATHOLOGY LSVT LOUD TREATMENT AND PROGRESS NOTE   Patient Name: Kendra [name] MRN: [id number] DOB:Mar 21, 1946, 76 y.o., female Today's Date: 10/19/2022   Speech Therapy Progress Note  Dates of Reporting Period: 09/29/2022 to 10/19/2022   Objective: Patient has been seen for 10 speech therapy sessions this reporting period targeting dysarthria. Patient is making progress and increasing loudness, vocal quality, however she is averaging 82 dB for phonation tasks and lower 70s for structured speech tasks. LTG revised for pt to maintain this level with reduced need for cues. See skilled intervention, clinical impressions, and goals below for details.   PCP: Lavon Paganini, MD REFERRING PROVIDER: Tally Joe, FNP   End of Session - 10/19/22 1115     Visit Number 10    Number of Visits 17    Date for SLP Re-Evaluation 11/28/22    SLP Start Time 58    SLP Stop Time  1200    SLP Time Calculation (min) 60 min    Activity Tolerance Patient tolerated treatment well             Past Medical History:  Diagnosis Date   Anxiety    Breast cancer ([place]) 03/2018   right breast cancer at 11:00 and 1:00   Bursitis    bilateral hips and knees   Complication of anesthesia    Depression    Family history of breast cancer 03/24/2018   Family history of lung cancer 03/24/2018   History of alcohol dependence ([place]) 2007   no ETOH; resolved since 2007   History of hiatal hernia    History of psychosis 2014   due to sleep disturbance   Hypertension    Parkinson disease    Personal history of radiation therapy 06/2018-07/2018   right breast ca   PONV (postoperative nausea and vomiting)    Past Surgical History:  Procedure Laterality Date   BREAST BIOPSY Right 03/14/2018   11:00 DCIS and invasive ductal carcinoma   BREAST BIOPSY Right 03/14/2018   1:00 Invasive ductal carcinoma   BREAST LUMPECTOMY Right 04/27/2018   lumpectomy of 11 and 1:00 cancers, clear  margins, negative LN   BREAST LUMPECTOMY WITH RADIOACTIVE SEED AND SENTINEL LYMPH NODE BIOPSY Right 04/27/2018   Procedure: RIGHT BREAST LUMPECTOMY WITH RADIOACTIVE SEED X 2 AND RIGHT SENTINEL LYMPH NODE BIOPSY;  Surgeon: [name], MD;  Location: [facility];  Service: General;  Laterality: Right;   CATARACT EXTRACTION W/PHACO Left 08/30/2019   Procedure: CATARACT EXTRACTION PHACO AND INTRAOCULAR LENS PLACEMENT ([place]) LEFT panoptix toric  01:20.5  12.7%  10.26;  Surgeon: Leandrew Koyanagi, MD;  Location: [facility];  Service: Ophthalmology;  Laterality: Left;   CATARACT EXTRACTION W/PHACO Right 09/20/2019   Procedure: CATARACT EXTRACTION PHACO AND INTRAOCULAR LENS PLACEMENT ([facility]) RIGHT 5.71  00:36.4  15.7%;  Surgeon: Leandrew Koyanagi, MD;  Location: [facility];  Service: Ophthalmology;  Laterality: Right;   LAPAROTOMY N/A 02/10/2018   Procedure: EXPLORATORY LAPAROTOMY;  Surgeon: Isabel Caprice, MD;  Location: [facility];  Service: Gynecology;  Laterality: N/A;   MASS EXCISION  01/2018   abdominal   OVARIAN CYST REMOVAL     SALPINGOOPHORECTOMY Bilateral 02/10/2018   Procedure: BILATERAL SALPINGO OOPHORECTOMY; PERITONEAL WASHINGS;  Surgeon: Isabel Caprice, MD;  Location: [facility];  Service: Gynecology;  Laterality: Bilateral;   TONSILLECTOMY     TONSILLECTOMY AND ADENOIDECTOMY  1953   Patient Active Problem List   Diagnosis Date Noted   Blepharitis of upper and  lower eyelids of both eyes 08/17/2022   Subacute cough 08/17/2022   History of allergy to shellfish 02/24/2022   Chronic low back pain of over 3 months duration 02/04/2022   Osteoarthritis of  AC (acromioclavicular) joint (Left) 02/04/2022   Osteoarthritis of hip (Right) 02/04/2022   Impaction fracture of distal end of radius and ulna, sequela (Left) 02/04/2022   Osteoarthritis of 1st (thumb) carpometacarpal (CMC) joint of hand (Left) 02/04/2022   Lumbosacral facet arthropathy (Multilevel)  (Bilateral) (T12-S1) 02/04/2022   Lumbar central spinal stenosis w/o neurogenic claudication (L2-3, L3-4, L4-5) 02/04/2022   Lumbar foraminal stenosis (Bilateral: L2-3, L4-5) (Right: (Severe) L4-5) 02/04/2022   Lumbar lateral recess stenosis (Bilateral: L2-3, L3-4, and L4-5) (Severe) 02/04/2022   DDD (degenerative disc disease), lumbar 02/04/2022   Lumbosacral facet syndrome (Multilevel) (Bilateral) 02/04/2022   Osteoarthritis of lumbar spine 02/04/2022   Erythema ab igne 12/23/2021   Allergic conjunctivitis of both eyes 12/23/2021   Chronic hip pain (3ry area of Pain) (Bilateral) (L>R) 12/22/2021   Chronic lower extremity pain (2ry area of Pain) (Bilateral) (L>R) 12/22/2021   Abnormal MRI, lumbar spine (01/31/2020) 12/22/2021   Grade 1 Anterolisthesis of lumbar spine (L3/L4) & (5 mm) (L4/L5) 12/22/2021   Chronic pain syndrome 11/17/2021   High risk medication use 11/17/2021   Disorder of skeletal system 11/17/2021   Problems influencing health status 11/17/2021   Other hyperlipidemia 10/21/2021   Polypharmacy 08/12/2021   Chronic low back pain (1ry area of Pain) (Bilateral) (L>R) w/o sciatica 06/18/2021   Elevated blood pressure reading 05/23/2021   Osteopenia of neck of femur (Right) 09/06/2020   Obesity 08/29/2020   Parkinson disease 02/22/2020   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   Bipolar disorder, in full remission, most recent episode depressed ([place]) 11/08/2019   Vitamin D insufficiency 08/24/2019   Insomnia due to mental condition 05/31/2019   Bipolar I disorder, most recent episode depressed ([place]) 05/31/2019   Alcohol use disorder, moderate, in sustained remission ([place]) 05/31/2019   Elevated tumor markers 05/27/2019   GAD (generalized anxiety disorder) 04/10/2019   MDD (major depressive disorder), severe ([place]) 01/31/2019   High serum carbohydrate antigen 19-9 (CA19-9) 12/01/2018   Adjustment disorder with anxious mood 11/24/2018   Essential hypertension 11/24/2018   B12  deficiency 10/17/2018   Incisional hernia, without obstruction or gangrene 09/12/2018   Genetic testing 04/07/2018   Family history of breast cancer 03/24/2018   Family history of lung cancer 03/24/2018   Malignant neoplasm of upper-outer quadrant of right breast in female, estrogen receptor positive ([place]) 03/17/2018   Malignant neoplasm of upper-inner quadrant of right breast in female, estrogen receptor positive ([place]) 03/17/2018   History of psychosis 02/07/2018   History of insomnia 02/07/2018    Onset date: PD dx 2021  REFERRING DIAG: Parkinson's disease  THERAPY DIAG:  Dysarthria and anarthria  Rationale for Evaluation and Treatment Rehabilitation  SUBJECTIVE:   SUBJECTIVE STATEMENT: Cues necessary to use loud voice when greeting SLP  Pt accompanied by:  self  PERTINENT HISTORY: Patient is a 76 y.o. female with history of Parkinson's disease (dx 2021), anxiety, depression, psychosis, HTN, bipolar disorder, alcoholism, memory issues since 2005, hx head trauma. Referred for LSVT BIG and LOUD; known to our clinic from prior course of therapy completed in 2022.  DIAGNOSTIC FINDINGS: MRI Brain 08/08/2018: "1. No acute intracranial abnormality and largely unremarkable for age gray and white matter signal. 2. NeuroQuant volumetric analysis of the brain suggests age advanced brain volume loss, see details on [facility] PACS."  PAIN:  Are you having pain? Yes: NPRS scale: 7/10 Pain location: left hip   PATIENT GOALS communicate better  OBJECTIVE:   TODAY'S TREATMENT: During LSVT Daily tasks; pt required min cues for increased vocal intensity. Loud /a/ averaged 82 dB, avg duration 14.7 seconds, avg 235 Hz. Max F0 for pitch glides was 320 Hz (82 dB avg), min F0 for low glides was 172 Hz (82 dB avg). Functional phrases averaged 74 dB with rare min cues, range 119-243. Hierarchical loudness drills at paragraph level with cognitive load averaged 70 dB with occasional mod cues  PATIENT  EDUCATION: Education details: loud voice sounds just right Person educated: Patient and Spouse Education method: Explanation and Verbal cues Education comprehension: verbalized understanding   HOME EXERCISE PROGRAM: To be provided next session     GOALS: Goals reviewed with patient? Yes  SHORT TERM GOALS: Target date: 10 sessions  The patient will complete Daily Tasks (Maximum duration "ah", High/Lows, and Functional Phrases) at average loudness >/= 85 dB and with loud, good quality voice with min cues.  Baseline: 10/19/22: 81 dB Goal status: NOT MET  2.  The patient will complete Hierarchal Speech Loudness reading drills (words/phrases, sentences) at average >/= 75 dB and with loud, good quality voice with min cues. Baseline: 10/19/22: 72 dB Goal status: NOT MET  3.  The patient will participate in 5-8 minutes conversation, maintaining average loudness of 75 dB and loud, good quality voice with min cues. Baseline: 10/19/22 72 dB Goal status: NOT MET   LONG TERM GOALS: Target date:   The patient will complete Daily Tasks (Maximum duration "ah", High/Lows, and Functional Phrases) at average loudness >/= 82 dB and with loud, good quality voice Baseline:  Goal status: REVISED  2.  The patient will complete Hierarchal Speech Loudness reading drills (words/phrases, sentences, and paragraph) at average >/= 72 dB and with loud, good quality voice. Baseline:  Goal status: REVISED  3.  The patient will participate in 10 minutes conversation, maintaining average loudness of >/= 72 dB and loud, good quality voice. Baseline:  Goal status: REVISED  4.  Patient will report improved communication effectiveness as measured by Communicative Effectiveness Survey.  Baseline: 18/32 on 09/29/22 Goal status: IN PROGRESS   ASSESSMENT:  CLINICAL IMPRESSION: Patient is a 76 y.o. female who presents with mild-moderate dysarthria characterized by reduced vocal intensity, reduced pitch  variability, hoarse vocal quality, and subjectively low pitch for gender. Patient is demonstrating improved awareness of low-intensity speech, and is more consistently using increased effort in her spontaneous responses. She remains stimulable for improvements in intensity, vocal quality, and pitch range with clinician model and cues for "Loud like me," with varying need for cues. Cognitive load greatly increases need for cuing.  I recommend course of skilled ST for LSVT-LOUD to improve vocal quality and intensity for communicating with family and friends.   OBJECTIVE IMPAIRMENTS include dysarthria and voice disorder. These impairments are limiting patient from effectively communicating at home and in community. Factors affecting potential to achieve goals and functional outcome are ability to learn/carryover information and previous level of function.. Patient will benefit from skilled SLP services to address above impairments and improve overall function.  REHAB POTENTIAL: Good  PLAN: SLP FREQUENCY: 4x/week  SLP DURATION: 4 weeks  PLANNED INTERVENTIONS: Cueing hierachy, Functional tasks, SLP instruction and feedback, Compensatory strategies, Patient/family education, and LSVT-LOUD      Deneise Lever, MS, Actor [phone]

## 2022-10-19 NOTE — Therapy (Signed)
OUTPATIENT PHYSICAL THERAPY LSVT BIG TREATMENT/Physical Therapy Progress Note   Dates of reporting period  09/29/2022   to   10/19/2022    Patient Name: Kendra [name] MRN: [id number] DOB:February 23, 1946, 76 y.o., female Today's Date: 10/19/2022  PCP: Virginia Crews, MD REFERRING PROVIDER: Virginia Crews, MD  END OF SESSION:  PT End of Session - 10/19/22 1014     Visit Number 10    Number of Visits 17    Date for PT Re-Evaluation 11/10/22    PT Start Time 1016    PT Stop Time 1059    PT Time Calculation (min) 43 min    Equipment Utilized During Treatment Gait belt    Activity Tolerance Patient tolerated treatment well;Patient limited by fatigue;No increased pain    Behavior During Therapy [facility] for tasks assessed/performed                Past Medical History:  Diagnosis Date   Anxiety    Breast cancer ([place]) 03/2018   right breast cancer at 11:00 and 1:00   Bursitis    bilateral hips and knees   Complication of anesthesia    Depression    Family history of breast cancer 03/24/2018   Family history of lung cancer 03/24/2018   History of alcohol dependence ([place]) 2007   no ETOH; resolved since 2007   History of hiatal hernia    History of psychosis 2014   due to sleep disturbance   Hypertension    Parkinson disease    Personal history of radiation therapy 06/2018-07/2018   right breast ca   PONV (postoperative nausea and vomiting)    Past Surgical History:  Procedure Laterality Date   BREAST BIOPSY Right 03/14/2018   11:00 DCIS and invasive ductal carcinoma   BREAST BIOPSY Right 03/14/2018   1:00 Invasive ductal carcinoma   BREAST LUMPECTOMY Right 04/27/2018   lumpectomy of 11 and 1:00 cancers, clear margins, negative LN   BREAST LUMPECTOMY WITH RADIOACTIVE SEED AND SENTINEL LYMPH NODE BIOPSY Right 04/27/2018   Procedure: RIGHT BREAST LUMPECTOMY WITH RADIOACTIVE SEED X 2 AND RIGHT SENTINEL LYMPH NODE BIOPSY;  Surgeon: [name], MD;  Location:  [place];  Service: General;  Laterality: Right;   CATARACT EXTRACTION W/PHACO Left 08/30/2019   Procedure: CATARACT EXTRACTION PHACO AND INTRAOCULAR LENS PLACEMENT ([place]) LEFT panoptix toric  01:20.5  12.7%  10.26;  Surgeon: Leandrew Koyanagi, MD;  Location: [place];  Service: Ophthalmology;  Laterality: Left;   CATARACT EXTRACTION W/PHACO Right 09/20/2019   Procedure: CATARACT EXTRACTION PHACO AND INTRAOCULAR LENS PLACEMENT ([facility]) RIGHT 5.71  00:36.4  15.7%;  Surgeon: Leandrew Koyanagi, MD;  Location: [place];  Service: Ophthalmology;  Laterality: Right;   LAPAROTOMY N/A 02/10/2018   Procedure: EXPLORATORY LAPAROTOMY;  Surgeon: Isabel Caprice, MD;  Location: [facility];  Service: Gynecology;  Laterality: N/A;   MASS EXCISION  01/2018   abdominal   OVARIAN CYST REMOVAL     SALPINGOOPHORECTOMY Bilateral 02/10/2018   Procedure: BILATERAL SALPINGO OOPHORECTOMY; PERITONEAL WASHINGS;  Surgeon: Isabel Caprice, MD;  Location: [facility];  Service: Gynecology;  Laterality: Bilateral;   TONSILLECTOMY     TONSILLECTOMY AND ADENOIDECTOMY  1953   Patient Active Problem List   Diagnosis Date Noted   Blepharitis of upper and lower eyelids of both eyes 08/17/2022   Subacute cough 08/17/2022   History of allergy to shellfish 02/24/2022   Chronic low back pain of over 3 months duration 02/04/2022   Osteoarthritis  of  AC (acromioclavicular) joint (Left) 02/04/2022   Osteoarthritis of hip (Right) 02/04/2022   Impaction fracture of distal end of radius and ulna, sequela (Left) 02/04/2022   Osteoarthritis of 1st (thumb) carpometacarpal (CMC) joint of hand (Left) 02/04/2022   Lumbosacral facet arthropathy (Multilevel) (Bilateral) (T12-S1) 02/04/2022   Lumbar central spinal stenosis w/o neurogenic claudication (L2-3, L3-4, L4-5) 02/04/2022   Lumbar foraminal stenosis (Bilateral: L2-3, L4-5) (Right: (Severe) L4-5) 02/04/2022   Lumbar lateral recess stenosis (Bilateral:  L2-3, L3-4, and L4-5) (Severe) 02/04/2022   DDD (degenerative disc disease), lumbar 02/04/2022   Lumbosacral facet syndrome (Multilevel) (Bilateral) 02/04/2022   Osteoarthritis of lumbar spine 02/04/2022   Erythema ab igne 12/23/2021   Allergic conjunctivitis of both eyes 12/23/2021   Chronic hip pain (3ry area of Pain) (Bilateral) (L>R) 12/22/2021   Chronic lower extremity pain (2ry area of Pain) (Bilateral) (L>R) 12/22/2021   Abnormal MRI, lumbar spine (01/31/2020) 12/22/2021   Grade 1 Anterolisthesis of lumbar spine (L3/L4) & (5 mm) (L4/L5) 12/22/2021   Chronic pain syndrome 11/17/2021   High risk medication use 11/17/2021   Disorder of skeletal system 11/17/2021   Problems influencing health status 11/17/2021   Other hyperlipidemia 10/21/2021   Polypharmacy 08/12/2021   Chronic low back pain (1ry area of Pain) (Bilateral) (L>R) w/o sciatica 06/18/2021   Elevated blood pressure reading 05/23/2021   Osteopenia of neck of femur (Right) 09/06/2020   Obesity 08/29/2020   Parkinson disease 02/22/2020   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   Bipolar disorder, in full remission, most recent episode depressed ([place]) 11/08/2019   Vitamin D insufficiency 08/24/2019   Insomnia due to mental condition 05/31/2019   Bipolar I disorder, most recent episode depressed ([place]) 05/31/2019   Alcohol use disorder, moderate, in sustained remission ([place]) 05/31/2019   Elevated tumor markers 05/27/2019   GAD (generalized anxiety disorder) 04/10/2019   MDD (major depressive disorder), severe ([place]) 01/31/2019   High serum carbohydrate antigen 19-9 (CA19-9) 12/01/2018   Adjustment disorder with anxious mood 11/24/2018   Essential hypertension 11/24/2018   B12 deficiency 10/17/2018   Incisional hernia, without obstruction or gangrene 09/12/2018   Genetic testing 04/07/2018   Family history of breast cancer 03/24/2018   Family history of lung cancer 03/24/2018   Malignant neoplasm of upper-outer quadrant of right  breast in female, estrogen receptor positive ([place]) 03/17/2018   Malignant neoplasm of upper-inner quadrant of right breast in female, estrogen receptor positive ([place]) 03/17/2018   History of psychosis 02/07/2018   History of insomnia 02/07/2018    ONSET DATE: Pt diagnosed with PD in 2019  REFERRING DIAG:  G20 (ICD-10-CM) - Parkinson's disease      THERAPY DIAG:  Other lack of coordination  Muscle weakness (generalized)  Difficulty in walking, not elsewhere classified  Unsteadiness on feet  Pain in left hip  Rationale for Evaluation and Treatment: Rehabilitation  SUBJECTIVE:  SUBJECTIVE STATEMENT: Pt reports she took two Tylenol prior to sessoin. She reports continued hip pain. She reports no falls/stumbles or other concerns.   Pain: not rated but reports pain in L hip is present Pt accompanied by: significant other self  PERTINENT HISTORY:  Pt is a pleasant 76 yo female who was diagnosed with PD in 2019. She is known to clinic and [facility], and was last seen for LSVT BIG Sept-Oct 2022. Pt with chronic pain and has become increasingly sedentary as a result exacerbating mobility difficulties due to PD. Current concerns: Pt reports L and R hip pain and back pain. Pt was being seen by pain management clinic. Pt has had injections that provided relief at the time. Pt's spouse reports her physician determined surgery was not an option for her pain so the plan is to manage it. Pt spouse thinks pain has resulted in reluctance for pt to move. Pt currently sedentary, doesn't walk much, but when she does she holds on to furniture for support due to balance problems. When pt first stands she must hold on to something due to mix of pain and/or dizziness (described as lightheadedness). Her pain can go from 3/10 to  9/10. It does improve once she starts moving. Pt has increased difficulty with multiple activities including: getting in and out of her bed, getting in and out of her car, freezing, decreased gait speed, decreased balance and cognition, and difficulty using BUE due to tremors.  Per chart other PMH includes hx of breast cancer, HTN, MCI, insomnia, cataracts, bipolar 1, alcohol use disorder, GAD, MDD, osteopenia of neck of R femur, chronic B low back pain with radiating pain to buttock and posterior thighs and calves. Pt with multiple comorbidities, please refer to chart for full details    PAIN: from eval Are you having pain? Yes: NPRS scale: not rated currently but can reach 9/10 Pain location: L hip and L leg Pain description: pt has difficulty describing Aggravating factors: unsure Relieving factors: laying in the recliner helps, movement  PRECAUTIONS: Fall  WEIGHT BEARING RESTRICTIONS: No  FALLS: Has patient fallen in last 6 months? Yes. Number of falls 1x, leg tangled in chair leg when getting up from a chair  LIVING ENVIRONMENT: Lives with: lives with their spouse Lives in: House/apartment Stairs:  Has following equipment at home:  pt not currently using AD, holds onto walls/furniture, does have walker and SPC at home  PLOF: Independent with basic ADLs  PATIENT GOALS: improve mobility  IMAGING: via chart  07/24/22 DG BONE density " ASSESSMENT: The probability of a major osteoporotic fracture is 15.8% within the next ten years.   The probability of a hip fracture is 2.9% within the next ten years."  12/22/21 DG lumbar spine " IMPRESSION: 1. No acute displaced fracture or traumatic listhesis of the lumbar spine. 2. Interval development of grade 1 anterolisthesis of L3 on L4. Stable grade 1 anterolisthesis of L4 on L5. Appears stable on flexion extension views. 3.  Aortic Atherosclerosis (ICD10-I70.0)."  Please refer to chart for full details  OBJECTIVE: taken from eval  unless otherwise indicated  LSVT BIG EVALUATION & EXAMINATION   Neurological and Other Medical Information:   Pt known to clinic some answers via chart (2022) What were your initial symptoms of Parkinson's disease? first diagnosed 2019   Do you have tremors?  Yes: R hand   Do you have any pain? Yes: B hip and back, refer to above    Medical Information:  Medication for Parkinson's disease: sinemet   Motor Symptoms:    When did you first start to notice changes in your movement you associate with Parkinson's disease? Recent changes following d/c from previous LSVT and HH - increasingly sedentary due to pain per spouse   What are your current symptoms? Difficulty getting out of a chair, sometimes has difficulty getting in and out of bed, difficult with walking, still dresses herself but is mod I and sometimes must sit to dress, requires assistance with bathing, needs assistance with meal prep (due to balance and difficulty using her hands), has general difficulty with sequencing tasks/activities   What do you do when you want to move the best you possibly can? Pt does not know    Has Parkinson's disease caused you to move less or be less active? Yes: feels most recent contribution is due to pain, freezing  Have you noticed if your movement is slower than it used to be? For example, walking, getting dressed, doing household chores, bathing, etc. Yes: pt spouse reports difficulty maintaining at home exercises following discharge   Have you or others noticed any changes in your posture? Yes: pt notices she doesn't always stand up straight   How many (if any) falls have you had in the last six months? 1x     Have you noticed any freezing with your movement? Yes:     Are there some activities you now need help with because of your Parkinson's disease? See above     Assessment:     MMT: deferred due to time   Donning jacket: 17 seconds Doffing jacket: 7 seconds   Opening jar:  5.7 seconds with multiple attempts   Sit to stand assessment:  5x STS: 27 seconds    Gait assessments:  10 MWT:   0.58 m/s with rollator     Functional Component movements:   Sit to stand (required) Donning jacket (arm through sleeve) Opening jar Stepping over and on to obstacle (simulate getting in car simulation) Big and tall posture (stand BIG)   Hierarchies Getting in and out of bed 2. Ambulating for distance and over uneven surfaces/obstacles with rollator or LRD    PATIENT SURVEYS:  FOTO 40 (goal 49) - filled out by spouse due to pt impaired cognition   PATIENT EDUCATION:  Education details: Pt educated throughout session about proper posture and technique with exercises. Improved exercise technique, movement at target joints, use of target muscles after min to mod verbal, visual, tactile cues. LSVT BIG interventions, goals, indications for progress Person educated: Patient and Spouse Education method: Explanation, Demonstration, and Verbal cues Education comprehension: verbalized understanding, returned demonstration, verbal cues required, tactile cues required, and needs further education   TODAY'S TREATMENT:  DATE:    LSVT BIG Treatment:  Patient seen for LSVT Daily Session Maximal Daily Exercises for facilitation/coordination of movement Sustained movements are designed to rescale the amplitude of movement output for generalization to daily functional activities.  Maximal Adapted Daily Exercises: Floor to Ceiling 10 with 10 second hold  - only needs cuing for L finger abd today Side to Side 8 reps Bilateral with 10 second hold - requires approx 80% cuing Forward Step and Reach 12 Bilateral  Sideways Step and Reach 12 Bilateral Backwards Step and Reach 12 Bilateral Forwards Rock and Reach 12 Bilateral Sideways Rock and Reach 12  Bilateral  Maximal Daily Exercise Comments: needs modeling for correct positions, VC/TC for big reach, increased amplitude, increased effort >25% of time. Rest breaks taken throughout due to fatigue and for pain modulation ( pt reports no increase from baseline). Pt's pain and dizziness continues to be closely monitored throughout. Pt with significant improvement in ability to perform reps with decreased cuing.    Functional Component movements:  5 reps performed of each Sit to stand (required)  2. Donning jacket (arm through sleeve)  3. Opening jar  4. Stepping over and on to obstacle (simulate getting in car simulation) - using 6" step   5. Big and tall posture (stand/sit BIG)  Hierarchies Getting in and out of bed - instruction in rolling, sidelye>sit each side of bed. Continued increased cuing for rolling. - not able to perform today due to time limitations 2. Ambulating for distance and over uneven surfaces/obstacles, with CGA- incorporated into gait training - pt completes approx 2x10 meters using rollator with continued instruction in upright posture, and increased step length, proximity to walker. Incorporated into 10MWT.   Gait -pt completes approx 2x10 meters using rollator with continued instruction in upright posture, and increased step length, proximity to walker. Incorporated into 10MWT.    Carryover Assignment: Big reach across    [place]:  Pt to perform all 7 LSVT BIG  (adapted) maximal daily exercises, 5 Functional Component Tasks and big walking and carryover assignment (see above)  GOALS: Goals reviewed with patient? Yes  SHORT TERM GOALS: Target date: 10/27/2022    Patient will be independent in home exercise program to improve strength/mobility for better functional independence with ADLs. Baseline: LSVT BIG protocol to be initiated next session; 12/18: not yet fully indep, reports she still gets confused about the technique Goal status: IN  PROGRESS   LONG TERM GOALS: Target date: 11/11/2022   Patient will increase FOTO score to equal to or greater than 49 to demonstrate statistically significant improvement in mobility and quality of life.  Baseline: 40; 12/18: deferred due to time Goal status: IN PROGRESS  2.  Patient (> 10 years old) will complete five times sit to stand test in < 15 seconds indicating decreased fall risk.  Baseline: 27 sec; 12/18 23 sec hands-free Goal status: IN PROGRESS  3.  Patient will increase 10 meter walk test to >1.58ms with WFL B step-length, upright posture for >90% of test to improve ease with gait and to override hypokentic and bradykinetic movement.  Baseline: 0.58 m/s with 49FG 12/18: 0.7 m/s with 4WW Goal status: IN PROGRESS  4.  Patient will reduce time to don jacket by at least 5 seconds in order to increase ease with dressing and override bradykinetic movement Baseline: 17 seconds; 12/18: 12 seconds Goal status: MET  5.  Pt will demo or report ability to open standard jar using only 1-2 attempts  in order to exhibit improved functional mobility for ADLs.  Baseline: 5.7 seconds with multiple attempts to turn jar lid to open; 12/18: 2.8 sec opens in 2 turns Goal status: MET  ASSESSMENT:  CLINICAL IMPRESSION: Appointment somewhat limited secondary to pt late arrival. Goals reviewed today as well for progress note. Pt making gains AEB achieving two therapy goals and making gains on remaining (with exception of FOTO, of which testing deferred). This indicates improved mobility, gait, strength, and balance, and ability to override bradykinetic and hypokinetic movements.  The pt will benefit from further LSVT BIG PT in order to address these deficits to improve overall functional mobility, QOL and to decrease fall risk.  OBJECTIVE IMPAIRMENTS: Abnormal gait, decreased balance, decreased cognition, decreased coordination, decreased endurance, decreased knowledge of use of DME, decreased  mobility, difficulty walking, decreased strength, dizziness, hypomobility, impaired UE functional use, improper body mechanics, postural dysfunction, and pain.   ACTIVITY LIMITATIONS: lifting, bending, standing, squatting, stairs, transfers, bed mobility, bathing, dressing, hygiene/grooming, and locomotion level  PARTICIPATION LIMITATIONS: meal prep, cleaning, laundry, medication management, personal finances, driving, shopping, community activity, and yard work  PERSONAL FACTORS: Age, Fitness, Sex, Time since onset of injury/illness/exacerbation, and 3+ comorbidities: PMH includes hx of breast cancer, HTN, MCI, insomnia, cataracts, bipolar 1, alcohol use disorder, GAD, MDD, osteopenia of neck of R femur, chronic B low back pain with radiating pain to buttock and posterior thighs and calves. Pt with multiple comorbidities, please refer to chart for full details  are also affecting patient's functional outcome.   REHAB POTENTIAL: Fair    CLINICAL DECISION MAKING: Evolving/moderate complexity  EVALUATION COMPLEXITY: High  PLAN:  PT FREQUENCY: 4x/week  PT DURATION: 6 weeks  PLANNED INTERVENTIONS: Therapeutic exercises, Therapeutic activity, Neuromuscular re-education, Balance training, Gait training, Patient/Family education, Self Care, Joint mobilization, Stair training, Vestibular training, Canalith repositioning, Orthotic/Fit training, DME instructions, Wheelchair mobility training, Spinal mobilization, Cryotherapy, Moist heat, Splintting, Taping, Manual therapy, and Re-evaluation  PLAN FOR NEXT SESSION: adapted maximal daily exercises, functional component tasks, hierarchy tasks, big walking   Zollie Pee, PT 10/19/2022, 12:57 PM

## 2022-10-20 ENCOUNTER — Ambulatory Visit: Payer: Medicare PPO | Admitting: Speech Pathology

## 2022-10-20 ENCOUNTER — Ambulatory Visit: Payer: Medicare PPO

## 2022-10-20 DIAGNOSIS — R278 Other lack of coordination: Secondary | ICD-10-CM | POA: Diagnosis not present

## 2022-10-20 DIAGNOSIS — R2681 Unsteadiness on feet: Secondary | ICD-10-CM | POA: Diagnosis not present

## 2022-10-20 DIAGNOSIS — R262 Difficulty in walking, not elsewhere classified: Secondary | ICD-10-CM

## 2022-10-20 DIAGNOSIS — R471 Dysarthria and anarthria: Secondary | ICD-10-CM

## 2022-10-20 DIAGNOSIS — R41841 Cognitive communication deficit: Secondary | ICD-10-CM | POA: Diagnosis not present

## 2022-10-20 DIAGNOSIS — M6281 Muscle weakness (generalized): Secondary | ICD-10-CM | POA: Diagnosis not present

## 2022-10-20 DIAGNOSIS — M79605 Pain in left leg: Secondary | ICD-10-CM | POA: Diagnosis not present

## 2022-10-20 DIAGNOSIS — M25552 Pain in left hip: Secondary | ICD-10-CM | POA: Diagnosis not present

## 2022-10-20 DIAGNOSIS — M79604 Pain in right leg: Secondary | ICD-10-CM | POA: Diagnosis not present

## 2022-10-20 NOTE — Therapy (Signed)
OUTPATIENT SPEECH LANGUAGE PATHOLOGY LSVT LOUD TREATMENT   Patient Name: Kendra [name] MRN: [id number] DOB:[birth year], 76 y.o., female Today's Date: 10/20/2022    PCP: Lavon Paganini, MD REFERRING PROVIDER: Tally Joe, FNP   End of Session - 10/20/22 1121     Visit Number 11    Number of Visits 17    Date for SLP Re-Evaluation 11/28/22    SLP Start Time 50    SLP Stop Time  1200    SLP Time Calculation (min) 60 min    Activity Tolerance Patient tolerated treatment well             Past Medical History:  Diagnosis Date   Anxiety    Breast cancer ([place]) 03/2018   right breast cancer at 11:00 and 1:00   Bursitis    bilateral hips and knees   Complication of anesthesia    Depression    Family history of breast cancer 03/24/2018   Family history of lung cancer 03/24/2018   History of alcohol dependence ([place]) 2007   no ETOH; resolved since 2007   History of hiatal hernia    History of psychosis 2014   due to sleep disturbance   Hypertension    Parkinson disease    Personal history of radiation therapy 06/2018-07/2018   right breast ca   PONV (postoperative nausea and vomiting)    Past Surgical History:  Procedure Laterality Date   BREAST BIOPSY Right 03/14/2018   11:00 DCIS and invasive ductal carcinoma   BREAST BIOPSY Right 03/14/2018   1:00 Invasive ductal carcinoma   BREAST LUMPECTOMY Right 04/27/2018   lumpectomy of 11 and 1:00 cancers, clear margins, negative LN   BREAST LUMPECTOMY WITH RADIOACTIVE SEED AND SENTINEL LYMPH NODE BIOPSY Right 04/27/2018   Procedure: RIGHT BREAST LUMPECTOMY WITH RADIOACTIVE SEED X 2 AND RIGHT SENTINEL LYMPH NODE BIOPSY;  Surgeon: [name], MD;  Location: [facility];  Service: General;  Laterality: Right;   CATARACT EXTRACTION W/PHACO Left 08/30/2019   Procedure: CATARACT EXTRACTION PHACO AND INTRAOCULAR LENS PLACEMENT ([place]) LEFT panoptix toric  01:20.5  12.7%  10.26;  Surgeon: Leandrew Koyanagi,  MD;  Location: [facility];  Service: Ophthalmology;  Laterality: Left;   CATARACT EXTRACTION W/PHACO Right 09/20/2019   Procedure: CATARACT EXTRACTION PHACO AND INTRAOCULAR LENS PLACEMENT ([facility]) RIGHT 5.71  00:36.4  15.7%;  Surgeon: Leandrew Koyanagi, MD;  Location: [facility];  Service: Ophthalmology;  Laterality: Right;   LAPAROTOMY N/A 02/10/2018   Procedure: EXPLORATORY LAPAROTOMY;  Surgeon: Isabel Caprice, MD;  Location: [facility];  Service: Gynecology;  Laterality: N/A;   MASS EXCISION  01/2018   abdominal   OVARIAN CYST REMOVAL     SALPINGOOPHORECTOMY Bilateral 02/10/2018   Procedure: BILATERAL SALPINGO OOPHORECTOMY; PERITONEAL WASHINGS;  Surgeon: Isabel Caprice, MD;  Location: [facility];  Service: Gynecology;  Laterality: Bilateral;   TONSILLECTOMY     TONSILLECTOMY AND ADENOIDECTOMY  1953   Patient Active Problem List   Diagnosis Date Noted   Blepharitis of upper and lower eyelids of both eyes 08/17/2022   Subacute cough 08/17/2022   History of allergy to shellfish 02/24/2022   Chronic low back pain of over 3 months duration 02/04/2022   Osteoarthritis of  AC (acromioclavicular) joint (Left) 02/04/2022   Osteoarthritis of hip (Right) 02/04/2022   Impaction fracture of distal end of radius and ulna, sequela (Left) 02/04/2022   Osteoarthritis of 1st (thumb) carpometacarpal (CMC) joint of hand (Left) 02/04/2022   Lumbosacral facet arthropathy (Multilevel) (  Bilateral) (T12-S1) 02/04/2022   Lumbar central spinal stenosis w/o neurogenic claudication (L2-3, L3-4, L4-5) 02/04/2022   Lumbar foraminal stenosis (Bilateral: L2-3, L4-5) (Right: (Severe) L4-5) 02/04/2022   Lumbar lateral recess stenosis (Bilateral: L2-3, L3-4, and L4-5) (Severe) 02/04/2022   DDD (degenerative disc disease), lumbar 02/04/2022   Lumbosacral facet syndrome (Multilevel) (Bilateral) 02/04/2022   Osteoarthritis of lumbar spine 02/04/2022   Erythema ab igne 12/23/2021   Allergic conjunctivitis of  both eyes 12/23/2021   Chronic hip pain (3ry area of Pain) (Bilateral) (L>R) 12/22/2021   Chronic lower extremity pain (2ry area of Pain) (Bilateral) (L>R) 12/22/2021   Abnormal MRI, lumbar spine (01/31/2020) 12/22/2021   Grade 1 Anterolisthesis of lumbar spine (L3/L4) & (5 mm) (L4/L5) 12/22/2021   Chronic pain syndrome 11/17/2021   High risk medication use 11/17/2021   Disorder of skeletal system 11/17/2021   Problems influencing health status 11/17/2021   Other hyperlipidemia 10/21/2021   Polypharmacy 08/12/2021   Chronic low back pain (1ry area of Pain) (Bilateral) (L>R) w/o sciatica 06/18/2021   Elevated blood pressure reading 05/23/2021   Osteopenia of neck of femur (Right) 09/06/2020   Obesity 08/29/2020   Parkinson disease 02/22/2020   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   Bipolar disorder, in full remission, most recent episode depressed ([place]) 11/08/2019   Vitamin D insufficiency 08/24/2019   Insomnia due to mental condition 05/31/2019   Bipolar I disorder, most recent episode depressed ([place]) 05/31/2019   Alcohol use disorder, moderate, in sustained remission ([place]) 05/31/2019   Elevated tumor markers 05/27/2019   GAD (generalized anxiety disorder) 04/10/2019   MDD (major depressive disorder), severe ([place]) 01/31/2019   High serum carbohydrate antigen 19-9 (CA19-9) 12/01/2018   Adjustment disorder with anxious mood 11/24/2018   Essential hypertension 11/24/2018   B12 deficiency 10/17/2018   Incisional hernia, without obstruction or gangrene 09/12/2018   Genetic testing 04/07/2018   Family history of breast cancer 03/24/2018   Family history of lung cancer 03/24/2018   Malignant neoplasm of upper-outer quadrant of right breast in female, estrogen receptor positive ([place]) 03/17/2018   Malignant neoplasm of upper-inner quadrant of right breast in female, estrogen receptor positive ([place]) 03/17/2018   History of psychosis 02/07/2018   History of insomnia 02/07/2018    Onset date:  PD dx 2021  REFERRING DIAG: Parkinson's disease  THERAPY DIAG:  Dysarthria and anarthria  Rationale for Evaluation and Treatment Rehabilitation  SUBJECTIVE:   SUBJECTIVE STATEMENT: "I'm tired."  Pt accompanied by:  self  PERTINENT HISTORY: Patient is a 76 y.o. female with history of Parkinson's disease (dx 2021), anxiety, depression, psychosis, HTN, bipolar disorder, alcoholism, memory issues since 2005, hx head trauma. Referred for LSVT BIG and LOUD; known to our clinic from prior course of therapy completed in 2022.  DIAGNOSTIC FINDINGS: MRI Brain 08/08/2018: "1. No acute intracranial abnormality and largely unremarkable for age gray and white matter signal. 2. NeuroQuant volumetric analysis of the brain suggests age advanced brain volume loss, see details on [facility] PACS."  PAIN:  Are you having pain? Yes: NPRS scale: 7/10 Pain location: left hip   PATIENT GOALS communicate better  OBJECTIVE:   TODAY'S TREATMENT: Despite greeting with louder voice today, pt required increased level of cuing during LSVT Daily tasks. Loud /a/ averaged 78 dB, avg duration 7.5 seconds, with frequent modeling (to refrain from gliding). Max F0 for pitch glides was 288 Hz (80 dB avg), min F0 for low glides was 166 Hz (77 dB avg), usual mod-max cues. Functional phrases averaged 73 dB with  mod cues. Even with reduced complexity, length, and cognitive load, pt averaged 68 dB with usual mod cues in hierarchy reading tasks.   PATIENT EDUCATION: Education details: effort 74/10 Person educated: Patient and Spouse Education method: Explanation and Verbal cues Education comprehension: verbalized understanding   HOME EXERCISE PROGRAM: To be provided next session     GOALS: Goals reviewed with patient? Yes  SHORT TERM GOALS: Target date: 10 sessions  The patient will complete Daily Tasks (Maximum duration "ah", High/Lows, and Functional Phrases) at average loudness >/= 85 dB and with loud, good  quality voice with min cues.  Baseline: 10/19/22: 81 dB Goal status: NOT MET  2.  The patient will complete Hierarchal Speech Loudness reading drills (words/phrases, sentences) at average >/= 75 dB and with loud, good quality voice with min cues. Baseline: 10/19/22: 72 dB Goal status: NOT MET  3.  The patient will participate in 5-8 minutes conversation, maintaining average loudness of 75 dB and loud, good quality voice with min cues. Baseline: 10/19/22 72 dB Goal status: NOT MET   LONG TERM GOALS: Target date:   The patient will complete Daily Tasks (Maximum duration "ah", High/Lows, and Functional Phrases) at average loudness >/= 82 dB and with loud, good quality voice Baseline:  Goal status: REVISED  2.  The patient will complete Hierarchal Speech Loudness reading drills (words/phrases, sentences, and paragraph) at average >/= 72 dB and with loud, good quality voice. Baseline:  Goal status: REVISED  3.  The patient will participate in 10 minutes conversation, maintaining average loudness of >/= 72 dB and loud, good quality voice. Baseline:  Goal status: REVISED  4.  Patient will report improved communication effectiveness as measured by Communicative Effectiveness Survey.  Baseline: 18/32 on 09/29/22 Goal status: IN PROGRESS   ASSESSMENT:  CLINICAL IMPRESSION: Patient is a 76 y.o. female who presents with mild-moderate dysarthria characterized by reduced vocal intensity, reduced pitch variability, hoarse vocal quality, and subjectively low pitch for gender. Pt less stimulable today for improvements in loudness and vocal quality, with increased need for cuing across tasks. Pt reported fatigue today which may have been contributing factor. Cognitive load greatly increases need for cuing. Will continue with LSVT at this time, however may consider changing treatment approach if not improved next week. I recommend course of skilled ST for LSVT-LOUD to improve vocal quality and  intensity for communicating with family and friends.   OBJECTIVE IMPAIRMENTS include dysarthria and voice disorder. These impairments are limiting patient from effectively communicating at home and in community. Factors affecting potential to achieve goals and functional outcome are ability to learn/carryover information and previous level of function.. Patient will benefit from skilled SLP services to address above impairments and improve overall function.  REHAB POTENTIAL: Good  PLAN: SLP FREQUENCY: 4x/week  SLP DURATION: 4 weeks  PLANNED INTERVENTIONS: Cueing hierachy, Functional tasks, SLP instruction and feedback, Compensatory strategies, Patient/family education, and LSVT-LOUD      Deneise Lever, MS, Actor [phone]

## 2022-10-20 NOTE — Therapy (Signed)
OUTPATIENT PHYSICAL THERAPY LSVT BIG TREATMENT   Patient Name: Kendra [name] MRN: [id number] DOB:1945/12/29, 76 y.o., female Today's Date: 10/20/2022  PCP: Virginia Crews, MD REFERRING PROVIDER: Virginia Crews, MD  END OF SESSION:  PT End of Session - 10/20/22 1159     Visit Number 11    Number of Visits 17    Date for PT Re-Evaluation 11/10/22    PT Start Time 1001    PT Stop Time 1100    PT Time Calculation (min) 59 min    Equipment Utilized During Treatment Gait belt    Activity Tolerance Patient tolerated treatment well;Patient limited by fatigue;No increased pain    Behavior During Therapy [facility] for tasks assessed/performed                 Past Medical History:  Diagnosis Date   Anxiety    Breast cancer ([place]) 03/2018   right breast cancer at 11:00 and 1:00   Bursitis    bilateral hips and knees   Complication of anesthesia    Depression    Family history of breast cancer 03/24/2018   Family history of lung cancer 03/24/2018   History of alcohol dependence ([place]) 2007   no ETOH; resolved since 2007   History of hiatal hernia    History of psychosis 2014   due to sleep disturbance   Hypertension    Parkinson disease    Personal history of radiation therapy 06/2018-07/2018   right breast ca   PONV (postoperative nausea and vomiting)    Past Surgical History:  Procedure Laterality Date   BREAST BIOPSY Right 03/14/2018   11:00 DCIS and invasive ductal carcinoma   BREAST BIOPSY Right 03/14/2018   1:00 Invasive ductal carcinoma   BREAST LUMPECTOMY Right 04/27/2018   lumpectomy of 11 and 1:00 cancers, clear margins, negative LN   BREAST LUMPECTOMY WITH RADIOACTIVE SEED AND SENTINEL LYMPH NODE BIOPSY Right 04/27/2018   Procedure: RIGHT BREAST LUMPECTOMY WITH RADIOACTIVE SEED X 2 AND RIGHT SENTINEL LYMPH NODE BIOPSY;  Surgeon: [name], MD;  Location: [place];  Service: General;  Laterality: Right;   CATARACT EXTRACTION  W/PHACO Left 08/30/2019   Procedure: CATARACT EXTRACTION PHACO AND INTRAOCULAR LENS PLACEMENT ([place]) LEFT panoptix toric  01:20.5  12.7%  10.26;  Surgeon: Leandrew Koyanagi, MD;  Location: [place];  Service: Ophthalmology;  Laterality: Left;   CATARACT EXTRACTION W/PHACO Right 09/20/2019   Procedure: CATARACT EXTRACTION PHACO AND INTRAOCULAR LENS PLACEMENT ([facility]) RIGHT 5.71  00:36.4  15.7%;  Surgeon: Leandrew Koyanagi, MD;  Location: [place];  Service: Ophthalmology;  Laterality: Right;   LAPAROTOMY N/A 02/10/2018   Procedure: EXPLORATORY LAPAROTOMY;  Surgeon: Isabel Caprice, MD;  Location: [facility];  Service: Gynecology;  Laterality: N/A;   MASS EXCISION  01/2018   abdominal   OVARIAN CYST REMOVAL     SALPINGOOPHORECTOMY Bilateral 02/10/2018   Procedure: BILATERAL SALPINGO OOPHORECTOMY; PERITONEAL WASHINGS;  Surgeon: Isabel Caprice, MD;  Location: [facility];  Service: Gynecology;  Laterality: Bilateral;   TONSILLECTOMY     TONSILLECTOMY AND ADENOIDECTOMY  1953   Patient Active Problem List   Diagnosis Date Noted   Blepharitis of upper and lower eyelids of both eyes 08/17/2022   Subacute cough 08/17/2022   History of allergy to shellfish 02/24/2022   Chronic low back pain of over 3 months duration 02/04/2022   Osteoarthritis of  AC (acromioclavicular) joint (Left) 02/04/2022   Osteoarthritis of hip (Right) 02/04/2022   Impaction  fracture of distal end of radius and ulna, sequela (Left) 02/04/2022   Osteoarthritis of 1st (thumb) carpometacarpal (CMC) joint of hand (Left) 02/04/2022   Lumbosacral facet arthropathy (Multilevel) (Bilateral) (T12-S1) 02/04/2022   Lumbar central spinal stenosis w/o neurogenic claudication (L2-3, L3-4, L4-5) 02/04/2022   Lumbar foraminal stenosis (Bilateral: L2-3, L4-5) (Right: (Severe) L4-5) 02/04/2022   Lumbar lateral recess stenosis (Bilateral: L2-3, L3-4, and L4-5) (Severe) 02/04/2022   DDD (degenerative disc disease), lumbar  02/04/2022   Lumbosacral facet syndrome (Multilevel) (Bilateral) 02/04/2022   Osteoarthritis of lumbar spine 02/04/2022   Erythema ab igne 12/23/2021   Allergic conjunctivitis of both eyes 12/23/2021   Chronic hip pain (3ry area of Pain) (Bilateral) (L>R) 12/22/2021   Chronic lower extremity pain (2ry area of Pain) (Bilateral) (L>R) 12/22/2021   Abnormal MRI, lumbar spine (01/31/2020) 12/22/2021   Grade 1 Anterolisthesis of lumbar spine (L3/L4) & (5 mm) (L4/L5) 12/22/2021   Chronic pain syndrome 11/17/2021   High risk medication use 11/17/2021   Disorder of skeletal system 11/17/2021   Problems influencing health status 11/17/2021   Other hyperlipidemia 10/21/2021   Polypharmacy 08/12/2021   Chronic low back pain (1ry area of Pain) (Bilateral) (L>R) w/o sciatica 06/18/2021   Elevated blood pressure reading 05/23/2021   Osteopenia of neck of femur (Right) 09/06/2020   Obesity 08/29/2020   Parkinson disease 02/22/2020   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   Bipolar disorder, in full remission, most recent episode depressed ([place]) 11/08/2019   Vitamin D insufficiency 08/24/2019   Insomnia due to mental condition 05/31/2019   Bipolar I disorder, most recent episode depressed ([place]) 05/31/2019   Alcohol use disorder, moderate, in sustained remission ([place]) 05/31/2019   Elevated tumor markers 05/27/2019   GAD (generalized anxiety disorder) 04/10/2019   MDD (major depressive disorder), severe ([place]) 01/31/2019   High serum carbohydrate antigen 19-9 (CA19-9) 12/01/2018   Adjustment disorder with anxious mood 11/24/2018   Essential hypertension 11/24/2018   B12 deficiency 10/17/2018   Incisional hernia, without obstruction or gangrene 09/12/2018   Genetic testing 04/07/2018   Family history of breast cancer 03/24/2018   Family history of lung cancer 03/24/2018   Malignant neoplasm of upper-outer quadrant of right breast in female, estrogen receptor positive ([place]) 03/17/2018   Malignant neoplasm  of upper-inner quadrant of right breast in female, estrogen receptor positive ([place]) 03/17/2018   History of psychosis 02/07/2018   History of insomnia 02/07/2018    ONSET DATE: Pt diagnosed with PD in 2019  REFERRING DIAG:  G20 (ICD-10-CM) - Parkinson's disease      THERAPY DIAG:  Other lack of coordination  Unsteadiness on feet  Muscle weakness (generalized)  Difficulty in walking, not elsewhere classified  Pain in left hip  Rationale for Evaluation and Treatment: Rehabilitation  SUBJECTIVE:  SUBJECTIVE STATEMENT: Pt reports continued L hip pain, but reports no change following appointments. She reports no stumbles/falls. Pt reports she was able to try some of her HEP. No other concerns/updates.  Pain: not rated but reports pain in L hip is present Pt accompanied by: significant other self  PERTINENT HISTORY:  Pt is a pleasant 76 yo female who was diagnosed with PD in 2019. She is known to clinic and [facility], and was last seen for LSVT BIG Sept-Oct 2022. Pt with chronic pain and has become increasingly sedentary as a result exacerbating mobility difficulties due to PD. Current concerns: Pt reports L and R hip pain and back pain. Pt was being seen by pain management clinic. Pt has had injections that provided relief at the time. Pt's spouse reports her physician determined surgery was not an option for her pain so the plan is to manage it. Pt spouse thinks pain has resulted in reluctance for pt to move. Pt currently sedentary, doesn't walk much, but when she does she holds on to furniture for support due to balance problems. When pt first stands she must hold on to something due to mix of pain and/or dizziness (described as lightheadedness). Her pain can go from 3/10 to 9/10. It does improve once she  starts moving. Pt has increased difficulty with multiple activities including: getting in and out of her bed, getting in and out of her car, freezing, decreased gait speed, decreased balance and cognition, and difficulty using BUE due to tremors.  Per chart other PMH includes hx of breast cancer, HTN, MCI, insomnia, cataracts, bipolar 1, alcohol use disorder, GAD, MDD, osteopenia of neck of R femur, chronic B low back pain with radiating pain to buttock and posterior thighs and calves. Pt with multiple comorbidities, please refer to chart for full details    PAIN: from eval Are you having pain? Yes: NPRS scale: not rated currently but can reach 9/10 Pain location: L hip and L leg Pain description: pt has difficulty describing Aggravating factors: unsure Relieving factors: laying in the recliner helps, movement  PRECAUTIONS: Fall  WEIGHT BEARING RESTRICTIONS: No  FALLS: Has patient fallen in last 6 months? Yes. Number of falls 1x, leg tangled in chair leg when getting up from a chair  LIVING ENVIRONMENT: Lives with: lives with their spouse Lives in: House/apartment Stairs:  Has following equipment at home:  pt not currently using AD, holds onto walls/furniture, does have walker and SPC at home  PLOF: Independent with basic ADLs  PATIENT GOALS: improve mobility  IMAGING: via chart  07/24/22 DG BONE density " ASSESSMENT: The probability of a major osteoporotic fracture is 15.8% within the next ten years.   The probability of a hip fracture is 2.9% within the next ten years."  12/22/21 DG lumbar spine " IMPRESSION: 1. No acute displaced fracture or traumatic listhesis of the lumbar spine. 2. Interval development of grade 1 anterolisthesis of L3 on L4. Stable grade 1 anterolisthesis of L4 on L5. Appears stable on flexion extension views. 3.  Aortic Atherosclerosis (ICD10-I70.0)."  Please refer to chart for full details  OBJECTIVE: taken from eval unless otherwise  indicated  LSVT BIG EVALUATION & EXAMINATION   Neurological and Other Medical Information:   Pt known to clinic some answers via chart (2022) What were your initial symptoms of Parkinson's disease? first diagnosed 2019   Do you have tremors?  Yes: R hand   Do you have any pain? Yes: B hip and back, refer to  above    Medical Information:    Medication for Parkinson's disease: sinemet   Motor Symptoms:    When did you first start to notice changes in your movement you associate with Parkinson's disease? Recent changes following d/c from previous LSVT and HH - increasingly sedentary due to pain per spouse   What are your current symptoms? Difficulty getting out of a chair, sometimes has difficulty getting in and out of bed, difficult with walking, still dresses herself but is mod I and sometimes must sit to dress, requires assistance with bathing, needs assistance with meal prep (due to balance and difficulty using her hands), has general difficulty with sequencing tasks/activities   What do you do when you want to move the best you possibly can? Pt does not know    Has Parkinson's disease caused you to move less or be less active? Yes: feels most recent contribution is due to pain, freezing  Have you noticed if your movement is slower than it used to be? For example, walking, getting dressed, doing household chores, bathing, etc. Yes: pt spouse reports difficulty maintaining at home exercises following discharge   Have you or others noticed any changes in your posture? Yes: pt notices she doesn't always stand up straight   How many (if any) falls have you had in the last six months? 1x     Have you noticed any freezing with your movement? Yes:     Are there some activities you now need help with because of your Parkinson's disease? See above     Assessment:     MMT: deferred due to time   Donning jacket: 17 seconds Doffing jacket: 7 seconds   Opening jar: 5.7 seconds with  multiple attempts   Sit to stand assessment:  5x STS: 27 seconds    Gait assessments:  10 MWT:   0.58 m/s with rollator     Functional Component movements:   Sit to stand (required) Donning jacket (arm through sleeve) Opening jar Stepping over and on to obstacle (simulate getting in car simulation) Big and tall posture (stand BIG)   Hierarchies Getting in and out of bed 2. Ambulating for distance and over uneven surfaces/obstacles with rollator or LRD    PATIENT SURVEYS:  FOTO 40 (goal 49) - filled out by spouse due to pt impaired cognition   PATIENT EDUCATION:  Education details: Pt educated throughout session about proper posture and technique with exercises. Improved exercise technique, movement at target joints, use of target muscles after min to mod verbal, visual, tactile cues. LSVT BIG interventions, goals, indications for progress Person educated: Patient and Spouse Education method: Explanation, Demonstration, and Verbal cues Education comprehension: verbalized understanding, returned demonstration, verbal cues required, tactile cues required, and needs further education   TODAY'S TREATMENT:  DATE:    LSVT BIG Treatment:  Patient seen for LSVT Daily Session Maximal Daily Exercises for facilitation/coordination of movement Sustained movements are designed to rescale the amplitude of movement output for generalization to daily functional activities.  Maximal Adapted Daily Exercises: Floor to Ceiling 10 with 10 second hold   Side to Side 8 reps Bilateral with 10 second hold Forward Step and Reach 12 Bilateral  Sideways Step and Reach 12 Bilateral Backwards Step and Reach 12 Bilateral Forwards Rock and Reach 12 Bilateral Sideways Rock and Reach 12 Bilateral  Maximal Daily Exercise Comments: needs modeling for correct positions,  VC/TC for big reach, increased amplitude, increased effort >25% of time. Rest breaks taken throughout due to fatigue. Pt performs majority of reps independently.     Functional Component movements:  5 reps performed of each Sit to stand (required)  2. Donning jacket (arm through sleeve)  3. Opening jar  4. Stepping over and on to obstacle (simulate getting in car simulation) - using 6" step   5. Big and tall posture (stand/sit BIG)  Hierarchies Getting in and out of bed - instruction in rolling, sidelye>sit to R side 3x thorugh 2. Ambulating for distance and over uneven surfaces/obstacles, with CGA- incorporated into gait training - pt completes approx 2x148 ft using rollator with continued instruction in upright posture, and increased step length, proximity to walker. Pt required one rest break due to fatigue. She also steps over two obstacles of varying heights 2x.   Gait -pt completes approx 2x148 meters using rollator with continued instruction in upright posture, and increased step length, proximity to walker and large amplitude cues to step over two objects 2x. Requires rest break between sets.   Carryover Assignment: Big posture with big reach for utensils while at restaurant.     HOME EXERCISE PROGRAM:  Pt to perform all 7 LSVT BIG  (adapted) maximal daily exercises, 5 Functional Component Tasks and big walking and carryover assignment (see above)  GOALS: Goals reviewed with patient? Yes  SHORT TERM GOALS: Target date: 10/27/2022    Patient will be independent in home exercise program to improve strength/mobility for better functional independence with ADLs. Baseline: LSVT BIG protocol to be initiated next session; 12/18: not yet fully indep, reports she still gets confused about the technique Goal status: IN PROGRESS   LONG TERM GOALS: Target date: 11/11/2022   Patient will increase FOTO score to equal to or greater than 49 to demonstrate statistically significant  improvement in mobility and quality of life.  Baseline: 40; 12/18: deferred due to time Goal status: IN PROGRESS  2.  Patient (> 26 years old) will complete five times sit to stand test in < 15 seconds indicating decreased fall risk.  Baseline: 27 sec; 12/18 23 sec hands-free Goal status: IN PROGRESS  3.  Patient will increase 10 meter walk test to >1.31ms with WFL B step-length, upright posture for >90% of test to improve ease with gait and to override hypokentic and bradykinetic movement.  Baseline: 0.58 m/s with 42AJ 12/18: 0.7 m/s with 4WW Goal status: IN PROGRESS  4.  Patient will reduce time to don jacket by at least 5 seconds in order to increase ease with dressing and override bradykinetic movement Baseline: 17 seconds; 12/18: 12 seconds Goal status: MET  5.  Pt will demo or report ability to open standard jar using only 1-2 attempts in order to exhibit improved functional mobility for ADLs.  Baseline: 5.7 seconds with multiple attempts to turn jar  lid to open; 12/18: 2.8 sec opens in 2 turns Goal status: MET  ASSESSMENT:  CLINICAL IMPRESSION: The pt continues to increase indep with performing maximal daily exercises. She was also able to ambulate for further distances today using 4WW. She has greatest difficultly with shoulder ext and UE/LE coordination of maximal daily exercise 6. The pt will benefit from further LSVT BIG PT in order to address these deficits to improve overall functional mobility, QOL and to decrease fall risk.  OBJECTIVE IMPAIRMENTS: Abnormal gait, decreased balance, decreased cognition, decreased coordination, decreased endurance, decreased knowledge of use of DME, decreased mobility, difficulty walking, decreased strength, dizziness, hypomobility, impaired UE functional use, improper body mechanics, postural dysfunction, and pain.   ACTIVITY LIMITATIONS: lifting, bending, standing, squatting, stairs, transfers, bed mobility, bathing, dressing,  hygiene/grooming, and locomotion level  PARTICIPATION LIMITATIONS: meal prep, cleaning, laundry, medication management, personal finances, driving, shopping, community activity, and yard work  PERSONAL FACTORS: Age, Fitness, Sex, Time since onset of injury/illness/exacerbation, and 3+ comorbidities: PMH includes hx of breast cancer, HTN, MCI, insomnia, cataracts, bipolar 1, alcohol use disorder, GAD, MDD, osteopenia of neck of R femur, chronic B low back pain with radiating pain to buttock and posterior thighs and calves. Pt with multiple comorbidities, please refer to chart for full details  are also affecting patient's functional outcome.   REHAB POTENTIAL: Fair    CLINICAL DECISION MAKING: Evolving/moderate complexity  EVALUATION COMPLEXITY: High  PLAN:  PT FREQUENCY: 4x/week  PT DURATION: 6 weeks  PLANNED INTERVENTIONS: Therapeutic exercises, Therapeutic activity, Neuromuscular re-education, Balance training, Gait training, Patient/Family education, Self Care, Joint mobilization, Stair training, Vestibular training, Canalith repositioning, Orthotic/Fit training, DME instructions, Wheelchair mobility training, Spinal mobilization, Cryotherapy, Moist heat, Splintting, Taping, Manual therapy, and Re-evaluation  PLAN FOR NEXT SESSION: adapted maximal daily exercises, functional component tasks, hierarchy tasks, big walking   Zollie Pee, PT 10/20/2022, 2:24 PM

## 2022-10-21 ENCOUNTER — Ambulatory Visit: Payer: Medicare PPO | Admitting: Speech Pathology

## 2022-10-21 ENCOUNTER — Ambulatory Visit: Payer: Medicare PPO

## 2022-10-21 DIAGNOSIS — R2681 Unsteadiness on feet: Secondary | ICD-10-CM | POA: Diagnosis not present

## 2022-10-21 DIAGNOSIS — R471 Dysarthria and anarthria: Secondary | ICD-10-CM | POA: Diagnosis not present

## 2022-10-21 DIAGNOSIS — R278 Other lack of coordination: Secondary | ICD-10-CM | POA: Diagnosis not present

## 2022-10-21 DIAGNOSIS — R41841 Cognitive communication deficit: Secondary | ICD-10-CM

## 2022-10-21 DIAGNOSIS — R262 Difficulty in walking, not elsewhere classified: Secondary | ICD-10-CM | POA: Diagnosis not present

## 2022-10-21 DIAGNOSIS — M6281 Muscle weakness (generalized): Secondary | ICD-10-CM | POA: Diagnosis not present

## 2022-10-21 DIAGNOSIS — M79605 Pain in left leg: Secondary | ICD-10-CM | POA: Diagnosis not present

## 2022-10-21 DIAGNOSIS — M79604 Pain in right leg: Secondary | ICD-10-CM | POA: Diagnosis not present

## 2022-10-21 DIAGNOSIS — M25552 Pain in left hip: Secondary | ICD-10-CM | POA: Diagnosis not present

## 2022-10-21 NOTE — Therapy (Signed)
OUTPATIENT PHYSICAL THERAPY LSVT BIG TREATMENT   Patient Name: Kendra [name] MRN: [id number] DOB:Mar 27, 1946, 76 y.o., female Today's Date: 10/21/2022  PCP: Virginia Crews, MD REFERRING PROVIDER: Virginia Crews, MD  END OF SESSION:  PT End of Session - 10/21/22 1608     Visit Number 12    Number of Visits 17    Date for PT Re-Evaluation 11/10/22    PT Start Time 2010    PT Stop Time 1059    PT Time Calculation (min) 57 min    Equipment Utilized During Treatment Gait belt    Activity Tolerance Patient tolerated treatment well;Patient limited by fatigue;No increased pain    Behavior During Therapy [facility] for tasks assessed/performed                 Past Medical History:  Diagnosis Date   Anxiety    Breast cancer ([place]) 03/2018   right breast cancer at 11:00 and 1:00   Bursitis    bilateral hips and knees   Complication of anesthesia    Depression    Family history of breast cancer 03/24/2018   Family history of lung cancer 03/24/2018   History of alcohol dependence ([place]) 2007   no ETOH; resolved since 2007   History of hiatal hernia    History of psychosis 2014   due to sleep disturbance   Hypertension    Parkinson disease    Personal history of radiation therapy 06/2018-07/2018   right breast ca   PONV (postoperative nausea and vomiting)    Past Surgical History:  Procedure Laterality Date   BREAST BIOPSY Right 03/14/2018   11:00 DCIS and invasive ductal carcinoma   BREAST BIOPSY Right 03/14/2018   1:00 Invasive ductal carcinoma   BREAST LUMPECTOMY Right 04/27/2018   lumpectomy of 11 and 1:00 cancers, clear margins, negative LN   BREAST LUMPECTOMY WITH RADIOACTIVE SEED AND SENTINEL LYMPH NODE BIOPSY Right 04/27/2018   Procedure: RIGHT BREAST LUMPECTOMY WITH RADIOACTIVE SEED X 2 AND RIGHT SENTINEL LYMPH NODE BIOPSY;  Surgeon: [name], MD;  Location: [employer];  Service: General;  Laterality: Right;   CATARACT EXTRACTION  W/PHACO Left 08/30/2019   Procedure: CATARACT EXTRACTION PHACO AND INTRAOCULAR LENS PLACEMENT ([place]) LEFT panoptix toric  01:20.5  12.7%  10.26;  Surgeon: Leandrew Koyanagi, MD;  Location: [place];  Service: Ophthalmology;  Laterality: Left;   CATARACT EXTRACTION W/PHACO Right 09/20/2019   Procedure: CATARACT EXTRACTION PHACO AND INTRAOCULAR LENS PLACEMENT ([facility]) RIGHT 5.71  00:36.4  15.7%;  Surgeon: Leandrew Koyanagi, MD;  Location: [place];  Service: Ophthalmology;  Laterality: Right;   LAPAROTOMY N/A 02/10/2018   Procedure: EXPLORATORY LAPAROTOMY;  Surgeon: Isabel Caprice, MD;  Location: [facility];  Service: Gynecology;  Laterality: N/A;   MASS EXCISION  01/2018   abdominal   OVARIAN CYST REMOVAL     SALPINGOOPHORECTOMY Bilateral 02/10/2018   Procedure: BILATERAL SALPINGO OOPHORECTOMY; PERITONEAL WASHINGS;  Surgeon: Isabel Caprice, MD;  Location: [facility];  Service: Gynecology;  Laterality: Bilateral;   TONSILLECTOMY     TONSILLECTOMY AND ADENOIDECTOMY  1953   Patient Active Problem List   Diagnosis Date Noted   Blepharitis of upper and lower eyelids of both eyes 08/17/2022   Subacute cough 08/17/2022   History of allergy to shellfish 02/24/2022   Chronic low back pain of over 3 months duration 02/04/2022   Osteoarthritis of  AC (acromioclavicular) joint (Left) 02/04/2022   Osteoarthritis of hip (Right) 02/04/2022   Impaction  fracture of distal end of radius and ulna, sequela (Left) 02/04/2022   Osteoarthritis of 1st (thumb) carpometacarpal (CMC) joint of hand (Left) 02/04/2022   Lumbosacral facet arthropathy (Multilevel) (Bilateral) (T12-S1) 02/04/2022   Lumbar central spinal stenosis w/o neurogenic claudication (L2-3, L3-4, L4-5) 02/04/2022   Lumbar foraminal stenosis (Bilateral: L2-3, L4-5) (Right: (Severe) L4-5) 02/04/2022   Lumbar lateral recess stenosis (Bilateral: L2-3, L3-4, and L4-5) (Severe) 02/04/2022   DDD (degenerative disc disease), lumbar  02/04/2022   Lumbosacral facet syndrome (Multilevel) (Bilateral) 02/04/2022   Osteoarthritis of lumbar spine 02/04/2022   Erythema ab igne 12/23/2021   Allergic conjunctivitis of both eyes 12/23/2021   Chronic hip pain (3ry area of Pain) (Bilateral) (L>R) 12/22/2021   Chronic lower extremity pain (2ry area of Pain) (Bilateral) (L>R) 12/22/2021   Abnormal MRI, lumbar spine (01/31/2020) 12/22/2021   Grade 1 Anterolisthesis of lumbar spine (L3/L4) & (5 mm) (L4/L5) 12/22/2021   Chronic pain syndrome 11/17/2021   High risk medication use 11/17/2021   Disorder of skeletal system 11/17/2021   Problems influencing health status 11/17/2021   Other hyperlipidemia 10/21/2021   Polypharmacy 08/12/2021   Chronic low back pain (1ry area of Pain) (Bilateral) (L>R) w/o sciatica 06/18/2021   Elevated blood pressure reading 05/23/2021   Osteopenia of neck of femur (Right) 09/06/2020   Obesity 08/29/2020   Parkinson disease 02/22/2020   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   Bipolar disorder, in full remission, most recent episode depressed ([place]) 11/08/2019   Vitamin D insufficiency 08/24/2019   Insomnia due to mental condition 05/31/2019   Bipolar I disorder, most recent episode depressed ([place]) 05/31/2019   Alcohol use disorder, moderate, in sustained remission ([place]) 05/31/2019   Elevated tumor markers 05/27/2019   GAD (generalized anxiety disorder) 04/10/2019   MDD (major depressive disorder), severe ([place]) 01/31/2019   High serum carbohydrate antigen 19-9 (CA19-9) 12/01/2018   Adjustment disorder with anxious mood 11/24/2018   Essential hypertension 11/24/2018   B12 deficiency 10/17/2018   Incisional hernia, without obstruction or gangrene 09/12/2018   Genetic testing 04/07/2018   Family history of breast cancer 03/24/2018   Family history of lung cancer 03/24/2018   Malignant neoplasm of upper-outer quadrant of right breast in female, estrogen receptor positive ([place]) 03/17/2018   Malignant neoplasm  of upper-inner quadrant of right breast in female, estrogen receptor positive ([place]) 03/17/2018   History of psychosis 02/07/2018   History of insomnia 02/07/2018    ONSET DATE: Pt diagnosed with PD in 2019  REFERRING DIAG:  G20 (ICD-10-CM) - Parkinson's disease      THERAPY DIAG:  Other lack of coordination  Difficulty in walking, not elsewhere classified  Muscle weakness (generalized)  Unsteadiness on feet  Rationale for Evaluation and Treatment: Rehabilitation  SUBJECTIVE: Pt reports no stumbles/falls. No other updates since last session reported. No changes.                                                                             Pain: not rated but reports pain in L hip is present Pt accompanied by: significant other self  PERTINENT HISTORY:  Pt is a pleasant 76 yo female who was diagnosed with PD in 2019. She is known to clinic and Chief Strategy Officer, and  was last seen for LSVT BIG Sept-Oct 2022. Pt with chronic pain and has become increasingly sedentary as a result exacerbating mobility difficulties due to PD. Current concerns: Pt reports L and R hip pain and back pain. Pt was being seen by pain management clinic. Pt has had injections that provided relief at the time. Pt's spouse reports her physician determined surgery was not an option for her pain so the plan is to manage it. Pt spouse thinks pain has resulted in reluctance for pt to move. Pt currently sedentary, doesn't walk much, but when she does she holds on to furniture for support due to balance problems. When pt first stands she must hold on to something due to mix of pain and/or dizziness (described as lightheadedness). Her pain can go from 3/10 to 9/10. It does improve once she starts moving. Pt has increased difficulty with multiple activities including: getting in and out of her bed, getting in and out of her car, freezing, decreased gait speed, decreased balance and cognition, and difficulty using BUE due to tremors.  Per  chart other PMH includes hx of breast cancer, HTN, MCI, insomnia, cataracts, bipolar 1, alcohol use disorder, GAD, MDD, osteopenia of neck of R femur, chronic B low back pain with radiating pain to buttock and posterior thighs and calves. Pt with multiple comorbidities, please refer to chart for full details    PAIN: from eval Are you having pain? Yes: NPRS scale: not rated currently but can reach 9/10 Pain location: L hip and L leg Pain description: pt has difficulty describing Aggravating factors: unsure Relieving factors: laying in the recliner helps, movement  PRECAUTIONS: Fall  WEIGHT BEARING RESTRICTIONS: No  FALLS: Has patient fallen in last 6 months? Yes. Number of falls 1x, leg tangled in chair leg when getting up from a chair  LIVING ENVIRONMENT: Lives with: lives with their spouse Lives in: House/apartment Stairs:  Has following equipment at home:  pt not currently using AD, holds onto walls/furniture, does have walker and SPC at home  PLOF: Independent with basic ADLs  PATIENT GOALS: improve mobility  IMAGING: via chart  07/24/22 DG BONE density " ASSESSMENT: The probability of a major osteoporotic fracture is 15.8% within the next ten years.   The probability of a hip fracture is 2.9% within the next ten years."  12/22/21 DG lumbar spine " IMPRESSION: 1. No acute displaced fracture or traumatic listhesis of the lumbar spine. 2. Interval development of grade 1 anterolisthesis of L3 on L4. Stable grade 1 anterolisthesis of L4 on L5. Appears stable on flexion extension views. 3.  Aortic Atherosclerosis (ICD10-I70.0)."  Please refer to chart for full details  OBJECTIVE: taken from eval unless otherwise indicated  LSVT BIG EVALUATION & EXAMINATION   Neurological and Other Medical Information:   Pt known to clinic some answers via chart (2022) What were your initial symptoms of Parkinson's disease? first diagnosed 2019   Do you have tremors?  Yes: R hand    Do you have any pain? Yes: B hip and back, refer to above    Medical Information:    Medication for Parkinson's disease: sinemet   Motor Symptoms:    When did you first start to notice changes in your movement you associate with Parkinson's disease? Recent changes following d/c from previous LSVT and HH - increasingly sedentary due to pain per spouse   What are your current symptoms? Difficulty getting out of a chair, sometimes has difficulty getting in and out of bed,  difficult with walking, still dresses herself but is mod I and sometimes must sit to dress, requires assistance with bathing, needs assistance with meal prep (due to balance and difficulty using her hands), has general difficulty with sequencing tasks/activities   What do you do when you want to move the best you possibly can? Pt does not know    Has Parkinson's disease caused you to move less or be less active? Yes: feels most recent contribution is due to pain, freezing  Have you noticed if your movement is slower than it used to be? For example, walking, getting dressed, doing household chores, bathing, etc. Yes: pt spouse reports difficulty maintaining at home exercises following discharge   Have you or others noticed any changes in your posture? Yes: pt notices she doesn't always stand up straight   How many (if any) falls have you had in the last six months? 1x     Have you noticed any freezing with your movement? Yes:     Are there some activities you now need help with because of your Parkinson's disease? See above     Assessment:     MMT: deferred due to time   Donning jacket: 17 seconds Doffing jacket: 7 seconds   Opening jar: 5.7 seconds with multiple attempts   Sit to stand assessment:  5x STS: 27 seconds    Gait assessments:  10 MWT:   0.58 m/s with rollator     Functional Component movements:   Sit to stand (required) Donning jacket (arm through sleeve) Opening jar Stepping over and on  to obstacle (simulate getting in car simulation) Big and tall posture (stand BIG)   Hierarchies Getting in and out of bed 2. Ambulating for distance and over uneven surfaces/obstacles with rollator or LRD    PATIENT SURVEYS:  FOTO 40 (goal 49) - filled out by spouse due to pt impaired cognition   PATIENT EDUCATION:  Education details: Pt educated throughout session about proper posture and technique with exercises. Improved exercise technique, movement at target joints, use of target muscles after min to mod verbal, visual, tactile cues. LSVT BIG interventions, goals, indications for progress Person educated: Patient and Spouse Education method: Explanation, Demonstration, and Verbal cues Education comprehension: verbalized understanding, returned demonstration, verbal cues required, tactile cues required, and needs further education   TODAY'S TREATMENT:                                                                                                                                         DATE:    LSVT BIG Treatment:  Patient seen for LSVT Daily Session Maximal Daily Exercises for facilitation/coordination of movement Sustained movements are designed to rescale the amplitude of movement output for generalization to daily functional activities.  Maximal Adapted Daily Exercises: Floor to Ceiling 10 with 10 second hold   Side to Side 8 reps Bilateral with 10 second  hold Forward Step and Reach 12 Bilateral  Sideways Step and Reach 12 Bilateral Backwards Step and Reach 12 Bilateral Forwards Rock and Reach 12 Bilateral Sideways Rock and Reach 12 Bilateral  Maximal Daily Exercise Comments: needs modeling for correct positions, VC/TC for big reach, increased amplitude, increased effort >25% of time. Rest breaks taken throughout due to fatigue. Pt reports throughout difficulty with recalling sequencing, however, is able multiple interventions with correct sequencing.    Functional  Component movements:  5 reps performed of each Sit to stand (required)  2. Donning jacket (arm through sleeve)  3. Opening jar  4. Stepping over and on to obstacle (simulate getting in car simulation) - using 6" step RLE, using 2" LLE to prevent increased hip pain  5. Big and tall posture (stand/sit BIG)  Hierarchies Getting in and out of bed - instruction in rolling, sidelye>sit performed each side 2x. Continued emphasis on rolling with large amplitude technique 2. Ambulating for distance and over uneven surfaces/obstacles, with CGA- incorporated into gait training - pt completes approx 3x148 ft using rollator with continued instruction in upright posture, and increased step length, proximity to walker. Pt additionally ambulates over 3 obstacles of approx 2-4" in height. Pt required 2 rest breaks due to fatigue.   Gait -pt completes approx 3x148 ft using rollator with continued instruction in upright posture, and increased step length, proximity to walker. Pt additionally ambulates over 3 obstacles of approx 2-4" in height. Pt required 2 rest breaks due to fatigue. Pt completes this in a moderately busy clinic environment.    Carryover Assignment: Use big amplitude technique this weekend with STS, reaching, opening hand/grasping, steps, upright posture    HOME EXERCISE PROGRAM:  Pt to perform all 7 LSVT BIG  (adapted) maximal daily exercises, 5 Functional Component Tasks and big walking and carryover assignment (see above)  GOALS: Goals reviewed with patient? Yes  SHORT TERM GOALS: Target date: 10/27/2022    Patient will be independent in home exercise program to improve strength/mobility for better functional independence with ADLs. Baseline: LSVT BIG protocol to be initiated next session; 12/18: not yet fully indep, reports she still gets confused about the technique Goal status: IN PROGRESS   LONG TERM GOALS: Target date: 11/11/2022   Patient will increase FOTO score to equal  to or greater than 49 to demonstrate statistically significant improvement in mobility and quality of life.  Baseline: 40; 12/18: deferred due to time Goal status: IN PROGRESS  2.  Patient (> 64 years old) will complete five times sit to stand test in < 15 seconds indicating decreased fall risk.  Baseline: 27 sec; 12/18 23 sec hands-free Goal status: IN PROGRESS  3.  Patient will increase 10 meter walk test to >1.41ms with WFL B step-length, upright posture for >90% of test to improve ease with gait and to override hypokentic and bradykinetic movement.  Baseline: 0.58 m/s with 46HM 12/18: 0.7 m/s with 4WW Goal status: IN PROGRESS  4.  Patient will reduce time to don jacket by at least 5 seconds in order to increase ease with dressing and override bradykinetic movement Baseline: 17 seconds; 12/18: 12 seconds Goal status: MET  5.  Pt will demo or report ability to open standard jar using only 1-2 attempts in order to exhibit improved functional mobility for ADLs.  Baseline: 5.7 seconds with multiple attempts to turn jar lid to open; 12/18: 2.8 sec opens in 2 turns Goal status: MET  ASSESSMENT:  CLINICAL IMPRESSION: Pt with similar  performance compared to previous session, and she reported doubts about her abilities to successfully complete exercises throughout. Pt not performing her HEP in full or in part consistently at home, which I believe is contributing to this. PT continues to provide education on importance of HEP and encouragement to make gains. Pt was able to ambulate for increased total distance today, but still does get fatigued and requires rest breaks/quick to ask for rest breaks. The pt will benefit from further LSVT BIG PT in order to address these deficits to improve overall functional mobility, QOL and to decrease fall risk.  OBJECTIVE IMPAIRMENTS: Abnormal gait, decreased balance, decreased cognition, decreased coordination, decreased endurance, decreased knowledge of use of  DME, decreased mobility, difficulty walking, decreased strength, dizziness, hypomobility, impaired UE functional use, improper body mechanics, postural dysfunction, and pain.   ACTIVITY LIMITATIONS: lifting, bending, standing, squatting, stairs, transfers, bed mobility, bathing, dressing, hygiene/grooming, and locomotion level  PARTICIPATION LIMITATIONS: meal prep, cleaning, laundry, medication management, personal finances, driving, shopping, community activity, and yard work  PERSONAL FACTORS: Age, Fitness, Sex, Time since onset of injury/illness/exacerbation, and 3+ comorbidities: PMH includes hx of breast cancer, HTN, MCI, insomnia, cataracts, bipolar 1, alcohol use disorder, GAD, MDD, osteopenia of neck of R femur, chronic B low back pain with radiating pain to buttock and posterior thighs and calves. Pt with multiple comorbidities, please refer to chart for full details  are also affecting patient's functional outcome.   REHAB POTENTIAL: Fair    CLINICAL DECISION MAKING: Evolving/moderate complexity  EVALUATION COMPLEXITY: High  PLAN:  PT FREQUENCY: 4x/week  PT DURATION: 6 weeks  PLANNED INTERVENTIONS: Therapeutic exercises, Therapeutic activity, Neuromuscular re-education, Balance training, Gait training, Patient/Family education, Self Care, Joint mobilization, Stair training, Vestibular training, Canalith repositioning, Orthotic/Fit training, DME instructions, Wheelchair mobility training, Spinal mobilization, Cryotherapy, Moist heat, Splintting, Taping, Manual therapy, and Re-evaluation  PLAN FOR NEXT SESSION: adapted maximal daily exercises, functional component tasks, hierarchy tasks, big walking   Zollie Pee, PT 10/21/2022, 4:15 PM

## 2022-10-21 NOTE — Therapy (Signed)
OUTPATIENT SPEECH LANGUAGE PATHOLOGY LSVT LOUD TREATMENT   Patient Name: Kendra [name] MRN: [id number] DOB:Mar 13, 1946, 76 y.o., female Today's Date: 10/21/2022    PCP: Lavon Paganini, MD REFERRING PROVIDER: Tally Joe, FNP   End of Session - 10/21/22 1100     Visit Number 12    Number of Visits 17    Date for SLP Re-Evaluation 11/28/22    SLP Start Time 67    SLP Stop Time  1200    SLP Time Calculation (min) 60 min    Activity Tolerance Patient tolerated treatment well             Past Medical History:  Diagnosis Date   Anxiety    Breast cancer ([place]) 03/2018   right breast cancer at 11:00 and 1:00   Bursitis    bilateral hips and knees   Complication of anesthesia    Depression    Family history of breast cancer 03/24/2018   Family history of lung cancer 03/24/2018   History of alcohol dependence ([place]) 2007   no ETOH; resolved since 2007   History of hiatal hernia    History of psychosis 2014   due to sleep disturbance   Hypertension    Parkinson disease    Personal history of radiation therapy 06/2018-07/2018   right breast ca   PONV (postoperative nausea and vomiting)    Past Surgical History:  Procedure Laterality Date   BREAST BIOPSY Right 03/14/2018   11:00 DCIS and invasive ductal carcinoma   BREAST BIOPSY Right 03/14/2018   1:00 Invasive ductal carcinoma   BREAST LUMPECTOMY Right 04/27/2018   lumpectomy of 11 and 1:00 cancers, clear margins, negative LN   BREAST LUMPECTOMY WITH RADIOACTIVE SEED AND SENTINEL LYMPH NODE BIOPSY Right 04/27/2018   Procedure: RIGHT BREAST LUMPECTOMY WITH RADIOACTIVE SEED X 2 AND RIGHT SENTINEL LYMPH NODE BIOPSY;  Surgeon: [name], MD;  Location: [facility];  Service: General;  Laterality: Right;   CATARACT EXTRACTION W/PHACO Left 08/30/2019   Procedure: CATARACT EXTRACTION PHACO AND INTRAOCULAR LENS PLACEMENT ([place]) LEFT panoptix toric  01:20.5  12.7%  10.26;  Surgeon: Leandrew Koyanagi,  MD;  Location: [facility];  Service: Ophthalmology;  Laterality: Left;   CATARACT EXTRACTION W/PHACO Right 09/20/2019   Procedure: CATARACT EXTRACTION PHACO AND INTRAOCULAR LENS PLACEMENT ([facility]) RIGHT 5.71  00:36.4  15.7%;  Surgeon: Leandrew Koyanagi, MD;  Location: [facility];  Service: Ophthalmology;  Laterality: Right;   LAPAROTOMY N/A 02/10/2018   Procedure: EXPLORATORY LAPAROTOMY;  Surgeon: Isabel Caprice, MD;  Location: [facility];  Service: Gynecology;  Laterality: N/A;   MASS EXCISION  01/2018   abdominal   OVARIAN CYST REMOVAL     SALPINGOOPHORECTOMY Bilateral 02/10/2018   Procedure: BILATERAL SALPINGO OOPHORECTOMY; PERITONEAL WASHINGS;  Surgeon: Isabel Caprice, MD;  Location: [facility];  Service: Gynecology;  Laterality: Bilateral;   TONSILLECTOMY     TONSILLECTOMY AND ADENOIDECTOMY  1953   Patient Active Problem List   Diagnosis Date Noted   Blepharitis of upper and lower eyelids of both eyes 08/17/2022   Subacute cough 08/17/2022   History of allergy to shellfish 02/24/2022   Chronic low back pain of over 3 months duration 02/04/2022   Osteoarthritis of  AC (acromioclavicular) joint (Left) 02/04/2022   Osteoarthritis of hip (Right) 02/04/2022   Impaction fracture of distal end of radius and ulna, sequela (Left) 02/04/2022   Osteoarthritis of 1st (thumb) carpometacarpal (CMC) joint of hand (Left) 02/04/2022   Lumbosacral facet arthropathy (Multilevel) (  Bilateral) (T12-S1) 02/04/2022   Lumbar central spinal stenosis w/o neurogenic claudication (L2-3, L3-4, L4-5) 02/04/2022   Lumbar foraminal stenosis (Bilateral: L2-3, L4-5) (Right: (Severe) L4-5) 02/04/2022   Lumbar lateral recess stenosis (Bilateral: L2-3, L3-4, and L4-5) (Severe) 02/04/2022   DDD (degenerative disc disease), lumbar 02/04/2022   Lumbosacral facet syndrome (Multilevel) (Bilateral) 02/04/2022   Osteoarthritis of lumbar spine 02/04/2022   Erythema ab igne 12/23/2021   Allergic conjunctivitis of  both eyes 12/23/2021   Chronic hip pain (3ry area of Pain) (Bilateral) (L>R) 12/22/2021   Chronic lower extremity pain (2ry area of Pain) (Bilateral) (L>R) 12/22/2021   Abnormal MRI, lumbar spine (01/31/2020) 12/22/2021   Grade 1 Anterolisthesis of lumbar spine (L3/L4) & (5 mm) (L4/L5) 12/22/2021   Chronic pain syndrome 11/17/2021   High risk medication use 11/17/2021   Disorder of skeletal system 11/17/2021   Problems influencing health status 11/17/2021   Other hyperlipidemia 10/21/2021   Polypharmacy 08/12/2021   Chronic low back pain (1ry area of Pain) (Bilateral) (L>R) w/o sciatica 06/18/2021   Elevated blood pressure reading 05/23/2021   Osteopenia of neck of femur (Right) 09/06/2020   Obesity 08/29/2020   Parkinson disease 02/22/2020   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   Bipolar disorder, in full remission, most recent episode depressed ([place]) 11/08/2019   Vitamin D insufficiency 08/24/2019   Insomnia due to mental condition 05/31/2019   Bipolar I disorder, most recent episode depressed ([place]) 05/31/2019   Alcohol use disorder, moderate, in sustained remission ([place]) 05/31/2019   Elevated tumor markers 05/27/2019   GAD (generalized anxiety disorder) 04/10/2019   MDD (major depressive disorder), severe ([place]) 01/31/2019   High serum carbohydrate antigen 19-9 (CA19-9) 12/01/2018   Adjustment disorder with anxious mood 11/24/2018   Essential hypertension 11/24/2018   B12 deficiency 10/17/2018   Incisional hernia, without obstruction or gangrene 09/12/2018   Genetic testing 04/07/2018   Family history of breast cancer 03/24/2018   Family history of lung cancer 03/24/2018   Malignant neoplasm of upper-outer quadrant of right breast in female, estrogen receptor positive ([place]) 03/17/2018   Malignant neoplasm of upper-inner quadrant of right breast in female, estrogen receptor positive ([place]) 03/17/2018   History of psychosis 02/07/2018   History of insomnia 02/07/2018    Onset date:  PD dx 2021  REFERRING DIAG: Parkinson's disease  THERAPY DIAG:  Dysarthria and anarthria  Cognitive communication deficit  Rationale for Evaluation and Treatment Rehabilitation  SUBJECTIVE:   SUBJECTIVE STATEMENT: "Can we do some easy ones?"  Pt accompanied by:  self  PERTINENT HISTORY: Patient is a 76 y.o. female with history of Parkinson's disease (dx 2021), anxiety, depression, psychosis, HTN, bipolar disorder, alcoholism, memory issues since 2005, hx head trauma. Referred for LSVT BIG and LOUD; known to our clinic from prior course of therapy completed in 2022.  DIAGNOSTIC FINDINGS: MRI Brain 08/08/2018: "1. No acute intracranial abnormality and largely unremarkable for age gray and white matter signal. 2. NeuroQuant volumetric analysis of the brain suggests age advanced brain volume loss, see details on [facility] PACS."  PAIN:  Are you having pain? Yes: NPRS scale: 7/10 Pain location: left hip   PATIENT GOALS communicate better  OBJECTIVE:   TODAY'S TREATMENT: Occasional mod cues necessary during LSVT Daily tasks. Loud /a/ averaged 80 dB, avg duration 11.8 seconds, with occasional modeling necessary for breath support. Max F0 for pitch glides was 306Hz (80 dB avg), min F0 for low glides was 160 Hz (78 dB avg), occasional mod cues. Functional phrases averaged 73 dB with mod  cues. Even with reduced complexity, length, and cognitive load, pt averaged 68 dB with usual mod cues in hierarchy reading tasks.   PATIENT EDUCATION: Education details: effort 30/10 Person educated: Patient and Spouse Education method: Explanation and Verbal cues Education comprehension: verbalized understanding   HOME EXERCISE PROGRAM: To be provided next session     GOALS: Goals reviewed with patient? Yes  SHORT TERM GOALS: Target date: 10 sessions  The patient will complete Daily Tasks (Maximum duration "ah", High/Lows, and Functional Phrases) at average loudness >/= 85 dB and with loud, good  quality voice with min cues.  Baseline: 10/19/22: 81 dB Goal status: NOT MET  2.  The patient will complete Hierarchal Speech Loudness reading drills (words/phrases, sentences) at average >/= 75 dB and with loud, good quality voice with min cues. Baseline: 10/19/22: 72 dB Goal status: NOT MET  3.  The patient will participate in 5-8 minutes conversation, maintaining average loudness of 75 dB and loud, good quality voice with min cues. Baseline: 10/19/22 72 dB Goal status: NOT MET   LONG TERM GOALS: Target date:   The patient will complete Daily Tasks (Maximum duration "ah", High/Lows, and Functional Phrases) at average loudness >/= 82 dB and with loud, good quality voice Baseline:  Goal status: REVISED  2.  The patient will complete Hierarchal Speech Loudness reading drills (words/phrases, sentences, and paragraph) at average >/= 72 dB and with loud, good quality voice. Baseline:  Goal status: REVISED  3.  The patient will participate in 10 minutes conversation, maintaining average loudness of >/= 72 dB and loud, good quality voice. Baseline:  Goal status: REVISED  4.  Patient will report improved communication effectiveness as measured by Communicative Effectiveness Survey.  Baseline: 18/32 on 09/29/22 Goal status: IN PROGRESS   ASSESSMENT:  CLINICAL IMPRESSION: Patient is a 76 y.o. female who presents with mild-moderate dysarthria characterized by reduced vocal intensity, reduced pitch variability, hoarse vocal quality, and subjectively low pitch for gender. Progress with loudness and vocal quality has plateaued this week; practice at home has been inconsistent. Despite feedback with audio recording (pt acknowledges voice sounds "too quiet"), reduced cognitive load, incorporation of topics of pt interest, appears to have reduced motivation and is less responsive to cues/modeling than during previous week of therapy. Prognosis for carryover is poor without consistent home  practice. Cognitive load greatly increases need for cuing. Will continue with LSVT for now, however may consider changing treatment approach or d/c if not improved next week. I recommend course of skilled ST for LSVT-LOUD to improve vocal quality and intensity for communicating with family and friends.   OBJECTIVE IMPAIRMENTS include dysarthria and voice disorder. These impairments are limiting patient from effectively communicating at home and in community. Factors affecting potential to achieve goals and functional outcome are ability to learn/carryover information and previous level of function.. Patient will benefit from skilled SLP services to address above impairments and improve overall function.  REHAB POTENTIAL: Good  PLAN: SLP FREQUENCY: 4x/week  SLP DURATION: 4 weeks  PLANNED INTERVENTIONS: Cueing hierachy, Functional tasks, SLP instruction and feedback, Compensatory strategies, Patient/family education, and LSVT-LOUD      Deneise Lever, MS, Actor [phone]

## 2022-10-22 ENCOUNTER — Ambulatory Visit: Payer: Medicare PPO

## 2022-10-22 ENCOUNTER — Encounter: Payer: Medicare PPO | Admitting: Speech Pathology

## 2022-10-27 ENCOUNTER — Ambulatory Visit: Payer: Medicare PPO

## 2022-10-27 ENCOUNTER — Ambulatory Visit: Payer: Medicare PPO | Admitting: Speech Pathology

## 2022-10-27 DIAGNOSIS — R471 Dysarthria and anarthria: Secondary | ICD-10-CM

## 2022-10-27 DIAGNOSIS — R42 Dizziness and giddiness: Secondary | ICD-10-CM

## 2022-10-27 DIAGNOSIS — R2681 Unsteadiness on feet: Secondary | ICD-10-CM

## 2022-10-27 DIAGNOSIS — R262 Difficulty in walking, not elsewhere classified: Secondary | ICD-10-CM | POA: Diagnosis not present

## 2022-10-27 DIAGNOSIS — M79605 Pain in left leg: Secondary | ICD-10-CM | POA: Diagnosis not present

## 2022-10-27 DIAGNOSIS — R41841 Cognitive communication deficit: Secondary | ICD-10-CM | POA: Diagnosis not present

## 2022-10-27 DIAGNOSIS — M6281 Muscle weakness (generalized): Secondary | ICD-10-CM | POA: Diagnosis not present

## 2022-10-27 DIAGNOSIS — R278 Other lack of coordination: Secondary | ICD-10-CM

## 2022-10-27 DIAGNOSIS — M25552 Pain in left hip: Secondary | ICD-10-CM | POA: Diagnosis not present

## 2022-10-27 DIAGNOSIS — M79604 Pain in right leg: Secondary | ICD-10-CM | POA: Diagnosis not present

## 2022-10-27 NOTE — Therapy (Signed)
OUTPATIENT PHYSICAL THERAPY LSVT BIG TREATMENT   Patient Name: Kendra [name] MRN: [id number] DOB:07/18/1946, 76 y.o., female Today's Date: 10/27/2022  PCP: Bacigalupo, Angela M, MD REFERRING PROVIDER: Bacigalupo, Angela M, MD  END OF SESSION:  PT End of Session - 10/27/22 1420     Visit Number 13    Number of Visits 17    Date for PT Re-Evaluation 11/10/22    PT Start Time 1001    PT Stop Time 1058    PT Time Calculation (min) 57 min    Equipment Utilized During Treatment Gait belt    Activity Tolerance Patient tolerated treatment well;Patient limited by fatigue;No increased pain    Behavior During Therapy WFL for tasks assessed/performed                  Past Medical History:  Diagnosis Date   Anxiety    Breast cancer (HCC) 03/2018   right breast cancer at 11:00 and 1:00   Bursitis    bilateral hips and knees   Complication of anesthesia    Depression    Family history of breast cancer 03/24/2018   Family history of lung cancer 03/24/2018   History of alcohol dependence (HCC) 2007   no ETOH; resolved since 2007   History of hiatal hernia    History of psychosis 2014   due to sleep disturbance   Hypertension    Parkinson disease    Personal history of radiation therapy 06/2018-07/2018   right breast ca   PONV (postoperative nausea and vomiting)    Past Surgical History:  Procedure Laterality Date   BREAST BIOPSY Right 03/14/2018   11:00 DCIS and invasive ductal carcinoma   BREAST BIOPSY Right 03/14/2018   1:00 Invasive ductal carcinoma   BREAST LUMPECTOMY Right 04/27/2018   lumpectomy of 11 and 1:00 cancers, clear margins, negative LN   BREAST LUMPECTOMY WITH RADIOACTIVE SEED AND SENTINEL LYMPH NODE BIOPSY Right 04/27/2018   Procedure: RIGHT BREAST LUMPECTOMY WITH RADIOACTIVE SEED X 2 AND RIGHT SENTINEL LYMPH NODE BIOPSY;  Surgeon: Hoxworth, Benjamin, MD;  Location: [facility];  Service: General;  Laterality: Right;   CATARACT EXTRACTION  W/PHACO Left 08/30/2019   Procedure: CATARACT EXTRACTION PHACO AND INTRAOCULAR LENS PLACEMENT (IOC) LEFT panoptix toric  01:20.5  12.7%  10.26;  Surgeon: Brasington, Chadwick, MD;  Location: [facility];  Service: Ophthalmology;  Laterality: Left;   CATARACT EXTRACTION W/PHACO Right 09/20/2019   Procedure: CATARACT EXTRACTION PHACO AND INTRAOCULAR LENS PLACEMENT (IOC) RIGHT 5.71  00:36.4  15.7%;  Surgeon: Brasington, Chadwick, MD;  Location: [facility];  Service: Ophthalmology;  Laterality: Right;   LAPAROTOMY N/A 02/10/2018   Procedure: EXPLORATORY LAPAROTOMY;  Surgeon: Phelps, Shawna B, MD;  Location: [facility];  Service: Gynecology;  Laterality: N/A;   MASS EXCISION  01/2018   abdominal   OVARIAN CYST REMOVAL     SALPINGOOPHORECTOMY Bilateral 02/10/2018   Procedure: BILATERAL SALPINGO OOPHORECTOMY; PERITONEAL WASHINGS;  Surgeon: Phelps, Shawna B, MD;  Location: [facility];  Service: Gynecology;  Laterality: Bilateral;   TONSILLECTOMY     TONSILLECTOMY AND ADENOIDECTOMY  1953   Patient Active Problem List   Diagnosis Date Noted   Blepharitis of upper and lower eyelids of both eyes 08/17/2022   Subacute cough 08/17/2022   History of allergy to shellfish 02/24/2022   Chronic low back pain of over 3 months duration 02/04/2022   Osteoarthritis of  AC (acromioclavicular) joint (Left) 02/04/2022   Osteoarthritis of hip (Right) 02/04/2022     Impaction fracture of distal end of radius and ulna, sequela (Left) 02/04/2022   Osteoarthritis of 1st (thumb) carpometacarpal (CMC) joint of hand (Left) 02/04/2022   Lumbosacral facet arthropathy (Multilevel) (Bilateral) (T12-S1) 02/04/2022   Lumbar central spinal stenosis w/o neurogenic claudication (L2-3, L3-4, L4-5) 02/04/2022   Lumbar foraminal stenosis (Bilateral: L2-3, L4-5) (Right: (Severe) L4-5) 02/04/2022   Lumbar lateral recess stenosis (Bilateral: L2-3, L3-4, and L4-5) (Severe) 02/04/2022   DDD (degenerative disc disease), lumbar  02/04/2022   Lumbosacral facet syndrome (Multilevel) (Bilateral) 02/04/2022   Osteoarthritis of lumbar spine 02/04/2022   Erythema ab igne 12/23/2021   Allergic conjunctivitis of both eyes 12/23/2021   Chronic hip pain (3ry area of Pain) (Bilateral) (L>R) 12/22/2021   Chronic lower extremity pain (2ry area of Pain) (Bilateral) (L>R) 12/22/2021   Abnormal MRI, lumbar spine (01/31/2020) 12/22/2021   Grade 1 Anterolisthesis of lumbar spine (L3/L4) & (5 mm) (L4/L5) 12/22/2021   Chronic pain syndrome 11/17/2021   High risk medication use 11/17/2021   Disorder of skeletal system 11/17/2021   Problems influencing health status 11/17/2021   Other hyperlipidemia 10/21/2021   Polypharmacy 08/12/2021   Chronic low back pain (1ry area of Pain) (Bilateral) (L>R) w/o sciatica 06/18/2021   Elevated blood pressure reading 05/23/2021   Osteopenia of neck of femur (Right) 09/06/2020   Obesity 08/29/2020   Parkinson disease 02/22/2020   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   Bipolar disorder, in full remission, most recent episode depressed (HCC) 11/08/2019   Vitamin D insufficiency 08/24/2019   Insomnia due to mental condition 05/31/2019   Bipolar I disorder, most recent episode depressed (HCC) 05/31/2019   Alcohol use disorder, moderate, in sustained remission (HCC) 05/31/2019   Elevated tumor markers 05/27/2019   GAD (generalized anxiety disorder) 04/10/2019   MDD (major depressive disorder), severe (HCC) 01/31/2019   High serum carbohydrate antigen 19-9 (CA19-9) 12/01/2018   Adjustment disorder with anxious mood 11/24/2018   Essential hypertension 11/24/2018   B12 deficiency 10/17/2018   Incisional hernia, without obstruction or gangrene 09/12/2018   Genetic testing 04/07/2018   Family history of breast cancer 03/24/2018   Family history of lung cancer 03/24/2018   Malignant neoplasm of upper-outer quadrant of right breast in female, estrogen receptor positive (HCC) 03/17/2018   Malignant neoplasm  of upper-inner quadrant of right breast in female, estrogen receptor positive (HCC) 03/17/2018   History of psychosis 02/07/2018   History of insomnia 02/07/2018    ONSET DATE: Pt diagnosed with PD in 2019  REFERRING DIAG:  G20 (ICD-10-CM) - Parkinson's disease      THERAPY DIAG:  Muscle weakness (generalized)  Difficulty in walking, not elsewhere classified  Other lack of coordination  Dizziness and giddiness  Unsteadiness on feet  Rationale for Evaluation and Treatment: Rehabilitation  SUBJECTIVE: Pt reports her hip feels ok today. She had a good Christmas. She reports she performed some of her HEP but not all of it and that she found it to be difficult.                                                                             Pain: reports her hips feel OK currently Pt accompanied by: significant other self  PERTINENT HISTORY:  Pt   is a pleasant 76 yo female who was diagnosed with PD in 2019. She is known to clinic and [facility], and was last seen for LSVT BIG Sept-Oct 2022. Pt with chronic pain and has become increasingly sedentary as a result exacerbating mobility difficulties due to PD. Current concerns: Pt reports L and R hip pain and back pain. Pt was being seen by pain management clinic. Pt has had injections that provided relief at the time. Pt's spouse reports her physician determined surgery was not an option for her pain so the plan is to manage it. Pt spouse thinks pain has resulted in reluctance for pt to move. Pt currently sedentary, doesn't walk much, but when she does she holds on to furniture for support due to balance problems. When pt first stands she must hold on to something due to mix of pain and/or dizziness (described as lightheadedness). Her pain can go from 3/10 to 9/10. It does improve once she starts moving. Pt has increased difficulty with multiple activities including: getting in and out of her bed, getting in and out of her car, freezing, decreased gait  speed, decreased balance and cognition, and difficulty using BUE due to tremors.  Per chart other PMH includes hx of breast cancer, HTN, MCI, insomnia, cataracts, bipolar 1, alcohol use disorder, GAD, MDD, osteopenia of neck of R femur, chronic B low back pain with radiating pain to buttock and posterior thighs and calves. Pt with multiple comorbidities, please refer to chart for full details    PAIN: from eval Are you having pain? Yes: NPRS scale: not rated currently but can reach 9/10 Pain location: L hip and L leg Pain description: pt has difficulty describing Aggravating factors: unsure Relieving factors: laying in the recliner helps, movement  PRECAUTIONS: Fall  WEIGHT BEARING RESTRICTIONS: No  FALLS: Has patient fallen in last 6 months? Yes. Number of falls 1x, leg tangled in chair leg when getting up from a chair  LIVING ENVIRONMENT: Lives with: lives with their spouse Lives in: House/apartment Stairs:  Has following equipment at home:  pt not currently using AD, holds onto walls/furniture, does have walker and SPC at home  PLOF: Independent with basic ADLs  PATIENT GOALS: improve mobility  IMAGING: via chart  07/24/22 DG BONE density " ASSESSMENT: The probability of a major osteoporotic fracture is 15.8% within the next ten years.   The probability of a hip fracture is 2.9% within the next ten years."  12/22/21 DG lumbar spine " IMPRESSION: 1. No acute displaced fracture or traumatic listhesis of the lumbar spine. 2. Interval development of grade 1 anterolisthesis of L3 on L4. Stable grade 1 anterolisthesis of L4 on L5. Appears stable on flexion extension views. 3.  Aortic Atherosclerosis (ICD10-I70.0)."  Please refer to chart for full details  OBJECTIVE: taken from eval unless otherwise indicated  LSVT BIG EVALUATION & EXAMINATION   Neurological and Other Medical Information:   Pt known to clinic some answers via chart (2022) What were your initial  symptoms of Parkinson's disease? first diagnosed 2019   Do you have tremors?  Yes: R hand   Do you have any pain? Yes: B hip and back, refer to above    Medical Information:    Medication for Parkinson's disease: sinemet   Motor Symptoms:    When did you first start to notice changes in your movement you associate with Parkinson's disease? Recent changes following d/c from previous LSVT and HH - increasingly sedentary due to pain per spouse  What are your current symptoms? Difficulty getting out of a chair, sometimes has difficulty getting in and out of bed, difficult with walking, still dresses herself but is mod I and sometimes must sit to dress, requires assistance with bathing, needs assistance with meal prep (due to balance and difficulty using her hands), has general difficulty with sequencing tasks/activities   What do you do when you want to move the best you possibly can? Pt does not know    Has Parkinson's disease caused you to move less or be less active? Yes: feels most recent contribution is due to pain, freezing  Have you noticed if your movement is slower than it used to be? For example, walking, getting dressed, doing household chores, bathing, etc. Yes: pt spouse reports difficulty maintaining at home exercises following discharge   Have you or others noticed any changes in your posture? Yes: pt notices she doesn't always stand up straight   How many (if any) falls have you had in the last six months? 1x     Have you noticed any freezing with your movement? Yes:     Are there some activities you now need help with because of your Parkinson's disease? See above     Assessment:     MMT: deferred due to time   Donning jacket: 17 seconds Doffing jacket: 7 seconds   Opening jar: 5.7 seconds with multiple attempts   Sit to stand assessment:  5x STS: 27 seconds    Gait assessments:  10 MWT:   0.58 m/s with rollator     Functional Component movements:   Sit  to stand (required) Donning jacket (arm through sleeve) Opening jar Stepping over and on to obstacle (simulate getting in car simulation) Big and tall posture (stand BIG)   Hierarchies Getting in and out of bed 2. Ambulating for distance and over uneven surfaces/obstacles with rollator or LRD    PATIENT SURVEYS:  FOTO 40 (goal 49) - filled out by spouse due to pt impaired cognition   PATIENT EDUCATION:  Education details: Pt educated throughout session about proper posture and technique with exercises. Improved exercise technique, movement at target joints, use of target muscles after min to mod verbal, visual, tactile cues. LSVT BIG interventions, goals, indications for progress Person educated: Patient and Spouse Education method: Explanation, Demonstration, and Verbal cues Education comprehension: verbalized understanding, returned demonstration, verbal cues required, tactile cues required, and needs further education   TODAY'S TREATMENT:                                                                                                                                         DATE:    LSVT BIG Treatment:  Patient seen for LSVT Daily Session Maximal Daily Exercises for facilitation/coordination of movement Sustained movements are designed to rescale the amplitude of movement output for generalization to daily functional activities.  Maximal Adapted Daily   Exercises: Floor to Ceiling 10 with 10 second hold   Side to Side 10 reps Bilateral with 10 second hold Forward Step and Reach 12 Bilateral  Sideways Step and Reach 12 Bilateral Backwards Step and Reach 12 Bilateral Forwards Rock and Reach 12 Bilateral Sideways Rock and Reach 12 Bilateral  Maximal Daily Exercise Comments: Pt continues to rqeuire modeling for correct positions, VC/TC for big reach, increased amplitude, increased effort >25% of time, particularly with interventions 5-7. Rest breaks taken throughout due to fatigue.  Increased cuing for abduction of LUE digits    Functional Component movements:  5 reps performed of each Sit to stand (required)  2. Donning jacket (arm through sleeve)  3. Opening jar  4. Stepping over and on to obstacle (simulate getting in car simulation)  5. Big and tall posture (stand/sit BIG)  Hierarchies Getting in and out of bed - instruction in rolling, sidelye>sit performed each side 2x from L to R side. Continued emphasis on rolling with large amplitude technique as pt continues to exhibit hypokinetic and bradykinetic movement 2. Ambulating for distance and over uneven surfaces/obstacles, with CGA, 4WW- incorporated into gait training - pt completes approx 2x148 ft with rest breaks between sets, stepping onto and off of mat, and over 2 balance pods. Continued instruction in upright posture, and increased step length, proximity to walker.   Gait -pt completes approx 2x148 ft with rest breaks between sets, stepping onto and off of mat, and over 2 balance pods. Continued instruction in upright posture, and increased step length, proximity to walker. Pt still most limited by fatigue and a wooziness/dizziness, symptoms suggestive of possible vestibular impairment.     Carryover Assignment: Big and open L hand when reaching for items/objects    HOME EXERCISE PROGRAM:  Pt to perform all 7 LSVT BIG  (adapted) maximal daily exercises, 5 Functional Component Tasks and big walking and carryover assignment (see above)  GOALS: Goals reviewed with patient? Yes  SHORT TERM GOALS: Target date: 10/27/2022    Patient will be independent in home exercise program to improve strength/mobility for better functional independence with ADLs. Baseline: LSVT BIG protocol to be initiated next session; 12/18: not yet fully indep, reports she still gets confused about the technique Goal status: IN PROGRESS   LONG TERM GOALS: Target date: 11/11/2022   Patient will increase FOTO score to equal to  or greater than 49 to demonstrate statistically significant improvement in mobility and quality of life.  Baseline: 40; 12/18: deferred due to time Goal status: IN PROGRESS  2.  Patient (> 60 years old) will complete five times sit to stand test in < 15 seconds indicating decreased fall risk.  Baseline: 27 sec; 12/18 23 sec hands-free Goal status: IN PROGRESS  3.  Patient will increase 10 meter walk test to >1.0m/s with WFL B step-length, upright posture for >90% of test to improve ease with gait and to override hypokentic and bradykinetic movement.  Baseline: 0.58 m/s with 4WW; 12/18: 0.7 m/s with 4WW Goal status: IN PROGRESS  4.  Patient will reduce time to don jacket by at least 5 seconds in order to increase ease with dressing and override bradykinetic movement Baseline: 17 seconds; 12/18: 12 seconds Goal status: MET  5.  Pt will demo or report ability to open standard jar using only 1-2 attempts in order to exhibit improved functional mobility for ADLs.  Baseline: 5.7 seconds with multiple attempts to turn jar lid to open; 12/18: 2.8 sec opens in 2 turns   Goal status: MET  ASSESSMENT:  CLINICAL IMPRESSION: Pt able to further increase total reps of maximal daily exercises, but requests/requires frequent, brief recovery intervals due to fatigue. Pt with minimal carryover from last session and reports she continues to struggle with HEP at home and is not consistent with HEP. Pt additionally continues to report "woozy" and "dizzy" feeling with upright activities and reports this dizzy feeling is present with head turns as well, suggestive of possible vestibular impairment contributing to decreased functional mobility. Pt might benefit from future vestibular assessment/rehab once completing LSVT. The pt will benefit from further LSVT BIG PT in order to address these deficits to improve overall functional mobility, QOL and to decrease fall risk.  OBJECTIVE IMPAIRMENTS: Abnormal gait, decreased  balance, decreased cognition, decreased coordination, decreased endurance, decreased knowledge of use of DME, decreased mobility, difficulty walking, decreased strength, dizziness, hypomobility, impaired UE functional use, improper body mechanics, postural dysfunction, and pain.   ACTIVITY LIMITATIONS: lifting, bending, standing, squatting, stairs, transfers, bed mobility, bathing, dressing, hygiene/grooming, and locomotion level  PARTICIPATION LIMITATIONS: meal prep, cleaning, laundry, medication management, personal finances, driving, shopping, community activity, and yard work  PERSONAL FACTORS: Age, Fitness, Sex, Time since onset of injury/illness/exacerbation, and 3+ comorbidities: PMH includes hx of breast cancer, HTN, MCI, insomnia, cataracts, bipolar 1, alcohol use disorder, GAD, MDD, osteopenia of neck of R femur, chronic B low back pain with radiating pain to buttock and posterior thighs and calves. Pt with multiple comorbidities, please refer to chart for full details  are also affecting patient's functional outcome.   REHAB POTENTIAL: Fair    CLINICAL DECISION MAKING: Evolving/moderate complexity  EVALUATION COMPLEXITY: High  PLAN:  PT FREQUENCY: 4x/week  PT DURATION: 6 weeks  PLANNED INTERVENTIONS: Therapeutic exercises, Therapeutic activity, Neuromuscular re-education, Balance training, Gait training, Patient/Family education, Self Care, Joint mobilization, Stair training, Vestibular training, Canalith repositioning, Orthotic/Fit training, DME instructions, Wheelchair mobility training, Spinal mobilization, Cryotherapy, Moist heat, Splintting, Taping, Manual therapy, and Re-evaluation  PLAN FOR NEXT SESSION: adapted maximal daily exercises, functional component tasks, hierarchy tasks, big walking   Zollie Pee, PT 10/27/2022, 2:28 PM

## 2022-10-27 NOTE — Therapy (Signed)
OUTPATIENT PHYSICAL THERAPY LSVT BIG TREATMENT   Patient Name: Kendra [name] MRN: [id number] DOB:12-Nov-1945, 76 y.o., female Today's Date: 10/28/2022  PCP: Erasmo Downer, MD REFERRING PROVIDER: Erasmo Downer, MD  END OF SESSION:  PT End of Session - 10/28/22 1006     Visit Number 14    Number of Visits 17    Date for PT Re-Evaluation 11/10/22    PT Start Time 1002    PT Stop Time 1100    PT Time Calculation (min) 58 min    Equipment Utilized During Treatment Gait belt    Activity Tolerance Patient tolerated treatment well;Patient limited by fatigue;No increased pain    Behavior During Therapy [facility] for tasks assessed/performed                  Past Medical History:  Diagnosis Date   Anxiety    Breast cancer (HCC) 03/2018   right breast cancer at 11:00 and 1:00   Bursitis    bilateral hips and knees   Complication of anesthesia    Depression    Family history of breast cancer 03/24/2018   Family history of lung cancer 03/24/2018   History of alcohol dependence (HCC) 2007   no ETOH; resolved since 2007   History of hiatal hernia    History of psychosis 2014   due to sleep disturbance   Hypertension    Parkinson disease    Personal history of radiation therapy 06/2018-07/2018   right breast ca   PONV (postoperative nausea and vomiting)    Past Surgical History:  Procedure Laterality Date   BREAST BIOPSY Right 03/14/2018   11:00 DCIS and invasive ductal carcinoma   BREAST BIOPSY Right 03/14/2018   1:00 Invasive ductal carcinoma   BREAST LUMPECTOMY Right 04/27/2018   lumpectomy of 11 and 1:00 cancers, clear margins, negative LN   BREAST LUMPECTOMY WITH RADIOACTIVE SEED AND SENTINEL LYMPH NODE BIOPSY Right 04/27/2018   Procedure: RIGHT BREAST LUMPECTOMY WITH RADIOACTIVE SEED X 2 AND RIGHT SENTINEL LYMPH NODE BIOPSY;  Surgeon: Glenna Fellows, MD;  Location: [facility];  Service: General;  Laterality: Right;   CATARACT EXTRACTION  W/PHACO Left 08/30/2019   Procedure: CATARACT EXTRACTION PHACO AND INTRAOCULAR LENS PLACEMENT (IOC) LEFT panoptix toric  01:20.5  12.7%  10.26;  Surgeon: Lockie Mola, MD;  Location: [facility];  Service: Ophthalmology;  Laterality: Left;   CATARACT EXTRACTION W/PHACO Right 09/20/2019   Procedure: CATARACT EXTRACTION PHACO AND INTRAOCULAR LENS PLACEMENT (IOC) RIGHT 5.71  00:36.4  15.7%;  Surgeon: Lockie Mola, MD;  Location: [facility];  Service: Ophthalmology;  Laterality: Right;   LAPAROTOMY N/A 02/10/2018   Procedure: EXPLORATORY LAPAROTOMY;  Surgeon: Shonna Chock, MD;  Location: [facility];  Service: Gynecology;  Laterality: N/A;   MASS EXCISION  01/2018   abdominal   OVARIAN CYST REMOVAL     SALPINGOOPHORECTOMY Bilateral 02/10/2018   Procedure: BILATERAL SALPINGO OOPHORECTOMY; PERITONEAL WASHINGS;  Surgeon: Shonna Chock, MD;  Location: [facility];  Service: Gynecology;  Laterality: Bilateral;   TONSILLECTOMY     TONSILLECTOMY AND ADENOIDECTOMY  1953   Patient Active Problem List   Diagnosis Date Noted   Blepharitis of upper and lower eyelids of both eyes 08/17/2022   Subacute cough 08/17/2022   History of allergy to shellfish 02/24/2022   Chronic low back pain of over 3 months duration 02/04/2022   Osteoarthritis of  AC (acromioclavicular) joint (Left) 02/04/2022   Osteoarthritis of hip (Right) 02/04/2022  Impaction fracture of distal end of radius and ulna, sequela (Left) 02/04/2022   Osteoarthritis of 1st (thumb) carpometacarpal (CMC) joint of hand (Left) 02/04/2022   Lumbosacral facet arthropathy (Multilevel) (Bilateral) (T12-S1) 02/04/2022   Lumbar central spinal stenosis w/o neurogenic claudication (L2-3, L3-4, L4-5) 02/04/2022   Lumbar foraminal stenosis (Bilateral: L2-3, L4-5) (Right: (Severe) L4-5) 02/04/2022   Lumbar lateral recess stenosis (Bilateral: L2-3, L3-4, and L4-5) (Severe) 02/04/2022   DDD (degenerative disc disease), lumbar  02/04/2022   Lumbosacral facet syndrome (Multilevel) (Bilateral) 02/04/2022   Osteoarthritis of lumbar spine 02/04/2022   Erythema ab igne 12/23/2021   Allergic conjunctivitis of both eyes 12/23/2021   Chronic hip pain (3ry area of Pain) (Bilateral) (L>R) 12/22/2021   Chronic lower extremity pain (2ry area of Pain) (Bilateral) (L>R) 12/22/2021   Abnormal MRI, lumbar spine (01/31/2020) 12/22/2021   Grade 1 Anterolisthesis of lumbar spine (L3/L4) & (5 mm) (L4/L5) 12/22/2021   Chronic pain syndrome 11/17/2021   High risk medication use 11/17/2021   Disorder of skeletal system 11/17/2021   Problems influencing health status 11/17/2021   Other hyperlipidemia 10/21/2021   Polypharmacy 08/12/2021   Chronic low back pain (1ry area of Pain) (Bilateral) (L>R) w/o sciatica 06/18/2021   Elevated blood pressure reading 05/23/2021   Osteopenia of neck of femur (Right) 09/06/2020   Obesity 08/29/2020   Parkinson disease 02/22/2020   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   Bipolar disorder, in full remission, most recent episode depressed (HCC) 11/08/2019   Vitamin D insufficiency 08/24/2019   Insomnia due to mental condition 05/31/2019   Bipolar I disorder, most recent episode depressed (HCC) 05/31/2019   Alcohol use disorder, moderate, in sustained remission (HCC) 05/31/2019   Elevated tumor markers 05/27/2019   GAD (generalized anxiety disorder) 04/10/2019   MDD (major depressive disorder), severe (HCC) 01/31/2019   High serum carbohydrate antigen 19-9 (CA19-9) 12/01/2018   Adjustment disorder with anxious mood 11/24/2018   Essential hypertension 11/24/2018   B12 deficiency 10/17/2018   Incisional hernia, without obstruction or gangrene 09/12/2018   Genetic testing 04/07/2018   Family history of breast cancer 03/24/2018   Family history of lung cancer 03/24/2018   Malignant neoplasm of upper-outer quadrant of right breast in female, estrogen receptor positive (HCC) 03/17/2018   Malignant neoplasm  of upper-inner quadrant of right breast in female, estrogen receptor positive (HCC) 03/17/2018   History of psychosis 02/07/2018   History of insomnia 02/07/2018    ONSET DATE: Pt diagnosed with PD in 2019  REFERRING DIAG:  G20 (ICD-10-CM) - Parkinson's disease      THERAPY DIAG:  Muscle weakness (generalized)  Difficulty in walking, not elsewhere classified  Other lack of coordination  Pain in left hip  Dizziness and giddiness  Unsteadiness on feet  Rationale for Evaluation and Treatment: Rehabilitation  SUBJECTIVE: Pt reports she has moderate hip pain at the moment. She reports no recent stumbles/falls. Says she did fine after her appointments yesterday.                                                                             Pain: reports her hips feel OK currently Pt accompanied by: significant other self  PERTINENT HISTORY:  Pt is a pleasant 76 yo  female who was diagnosed with PD in 2019. She is known to clinic and [facility], and was last seen for LSVT BIG Sept-Oct 2022. Pt with chronic pain and has become increasingly sedentary as a result exacerbating mobility difficulties due to PD. Current concerns: Pt reports L and R hip pain and back pain. Pt was being seen by pain management clinic. Pt has had injections that provided relief at the time. Pt's spouse reports her physician determined surgery was not an option for her pain so the plan is to manage it. Pt spouse thinks pain has resulted in reluctance for pt to move. Pt currently sedentary, doesn't walk much, but when she does she holds on to furniture for support due to balance problems. When pt first stands she must hold on to something due to mix of pain and/or dizziness (described as lightheadedness). Her pain can go from 3/10 to 9/10. It does improve once she starts moving. Pt has increased difficulty with multiple activities including: getting in and out of her bed, getting in and out of her car, freezing, decreased  gait speed, decreased balance and cognition, and difficulty using BUE due to tremors.  Per chart other PMH includes hx of breast cancer, HTN, MCI, insomnia, cataracts, bipolar 1, alcohol use disorder, GAD, MDD, osteopenia of neck of R femur, chronic B low back pain with radiating pain to buttock and posterior thighs and calves. Pt with multiple comorbidities, please refer to chart for full details    PAIN: from eval Are you having pain? Yes: NPRS scale: not rated currently but can reach 9/10 Pain location: L hip and L leg Pain description: pt has difficulty describing Aggravating factors: unsure Relieving factors: laying in the recliner helps, movement  PRECAUTIONS: Fall  WEIGHT BEARING RESTRICTIONS: No  FALLS: Has patient fallen in last 6 months? Yes. Number of falls 1x, leg tangled in chair leg when getting up from a chair  LIVING ENVIRONMENT: Lives with: lives with their spouse Lives in: House/apartment Stairs:  Has following equipment at home:  pt not currently using AD, holds onto walls/furniture, does have walker and SPC at home  PLOF: Independent with basic ADLs  PATIENT GOALS: improve mobility  IMAGING: via chart  07/24/22 DG BONE density " ASSESSMENT: The probability of a major osteoporotic fracture is 15.8% within the next ten years.   The probability of a hip fracture is 2.9% within the next ten years."  12/22/21 DG lumbar spine " IMPRESSION: 1. No acute displaced fracture or traumatic listhesis of the lumbar spine. 2. Interval development of grade 1 anterolisthesis of L3 on L4. Stable grade 1 anterolisthesis of L4 on L5. Appears stable on flexion extension views. 3.  Aortic Atherosclerosis (ICD10-I70.0)."  Please refer to chart for full details  OBJECTIVE: taken from eval unless otherwise indicated  LSVT BIG EVALUATION & EXAMINATION   Neurological and Other Medical Information:   Pt known to clinic some answers via chart (2022) What were your initial  symptoms of Parkinson's disease? first diagnosed 2019   Do you have tremors?  Yes: R hand   Do you have any pain? Yes: B hip and back, refer to above    Medical Information:    Medication for Parkinson's disease: sinemet   Motor Symptoms:    When did you first start to notice changes in your movement you associate with Parkinson's disease? Recent changes following d/c from previous LSVT and HH - increasingly sedentary due to pain per spouse   What are your current  symptoms? Difficulty getting out of a chair, sometimes has difficulty getting in and out of bed, difficult with walking, still dresses herself but is mod I and sometimes must sit to dress, requires assistance with bathing, needs assistance with meal prep (due to balance and difficulty using her hands), has general difficulty with sequencing tasks/activities   What do you do when you want to move the best you possibly can? Pt does not know    Has Parkinson's disease caused you to move less or be less active? Yes: feels most recent contribution is due to pain, freezing  Have you noticed if your movement is slower than it used to be? For example, walking, getting dressed, doing household chores, bathing, etc. Yes: pt spouse reports difficulty maintaining at home exercises following discharge   Have you or others noticed any changes in your posture? Yes: pt notices she doesn't always stand up straight   How many (if any) falls have you had in the last six months? 1x     Have you noticed any freezing with your movement? Yes:     Are there some activities you now need help with because of your Parkinson's disease? See above     Assessment:     MMT: deferred due to time   Donning jacket: 17 seconds Doffing jacket: 7 seconds   Opening jar: 5.7 seconds with multiple attempts   Sit to stand assessment:  5x STS: 27 seconds    Gait assessments:  10 MWT:   0.58 m/s with rollator     Functional Component movements:   Sit  to stand (required) Donning jacket (arm through sleeve) Opening jar Stepping over and on to obstacle (simulate getting in car simulation) Big and tall posture (stand BIG)   Hierarchies Getting in and out of bed 2. Ambulating for distance and over uneven surfaces/obstacles with rollator or LRD    PATIENT SURVEYS:  FOTO 40 (goal 49) - filled out by spouse due to pt impaired cognition   PATIENT EDUCATION:  Education details: Pt educated throughout session about proper posture and technique with exercises. Improved exercise technique, movement at target joints, use of target muscles after min to mod verbal, visual, tactile cues. LSVT BIG interventions, goals, indications for progress Person educated: Patient and Spouse Education method: Explanation, Demonstration, and Verbal cues Education comprehension: verbalized understanding, returned demonstration, verbal cues required, tactile cues required, and needs further education   TODAY'S TREATMENT:                                                                                                                                         DATE:    LSVT BIG Treatment:  Patient seen for LSVT Daily Session Maximal Daily Exercises for facilitation/coordination of movement Sustained movements are designed to rescale the amplitude of movement output for generalization to daily functional activities.  Maximal Adapted Daily Exercises: Floor to Ceiling  10 with 10 second hold   Side to Side 10 reps Bilateral with 10 second hold Forward Step and Reach 14 Bilateral  Sideways Step and Reach 14 Bilateral Backwards Step and Reach 12 Bilateral Forwards Rock and Reach 12 Bilateral Sideways Rock and Reach 12 Bilateral  Maximal Daily Exercise Comments: Pt requires modeling for correct positions, VC/TC for big reach, increased amplitude, increased effort >15% of time, particularly with interventions 5-7 and for LUE digit abduction.Marland Kitchen Rest breaks taken throughout  due to fatigue.    Functional Component movements:  5 reps performed of each Sit to stand (required)  2. Donning jacket (arm through sleeve)  3. Opening jar  4. Stepping over and on to obstacle (simulate getting in car simulation)  5. Big and tall posture (stand/sit BIG) Comments: Pt >80% indep with technique, requires no more than cuing 2x for STS today  Hierarchies Getting in and out of bed - instruction in rolling, sidelye>sit performed each side 3x from L to R side. Continued emphasis on rolling with large amplitude technique as pt continues to exhibit hypokinetic and bradykinetic movement and requires hands-on assist 2. Ambulating for distance and over uneven surfaces/obstacles, with CGA, 4WW- incorporated into gait training - pt completes approx 2x148 ft, 2x approx 45 ft with rest breaks between sets, stepping onto and ambulating off of mat, and over 2 balance pods and weaving around cones. Continued instruction in upright posture, and increased step length, proximity to walker. Requires rest due to swimmy-headed symptoms (chronic)  Gait -pt completes approx 2x148 ft, 2x approx 45 ft with rest breaks between sets, stepping onto and ambulating off of mat, and over 2 balance pods and weaving around cones. Continued instruction in upright posture, and increased step length, proximity to walker. Requires rest due to swimmy-headed symptoms (chronic)    Carryover Assignment: Big open L hand to reach for remote, sit with big posture on couch/chair    HOME EXERCISE PROGRAM:  Pt to perform all 7 LSVT BIG  (adapted) maximal daily exercises, 5 Functional Component Tasks and big walking and carryover assignment (see above)  GOALS: Goals reviewed with patient? Yes  SHORT TERM GOALS: Target date: 10/27/2022    Patient will be independent in home exercise program to improve strength/mobility for better functional independence with ADLs. Baseline: LSVT BIG protocol to be initiated next  session; 12/18: not yet fully indep, reports she still gets confused about the technique Goal status: IN PROGRESS   LONG TERM GOALS: Target date: 11/11/2022   Patient will increase FOTO score to equal to or greater than 49 to demonstrate statistically significant improvement in mobility and quality of life.  Baseline: 40; 12/18: deferred due to time Goal status: IN PROGRESS  2.  Patient (> 33 years old) will complete five times sit to stand test in < 15 seconds indicating decreased fall risk.  Baseline: 27 sec; 12/18 23 sec hands-free Goal status: IN PROGRESS  3.  Patient will increase 10 meter walk test to >1.72m/s with WFL B step-length, upright posture for >90% of test to improve ease with gait and to override hypokentic and bradykinetic movement.  Baseline: 0.58 m/s with 9JY; 12/18: 0.7 m/s with 4WW Goal status: IN PROGRESS  4.  Patient will reduce time to don jacket by at least 5 seconds in order to increase ease with dressing and override bradykinetic movement Baseline: 17 seconds; 12/18: 12 seconds Goal status: MET  5.  Pt will demo or report ability to open standard jar using only  1-2 attempts in order to exhibit improved functional mobility for ADLs.  Baseline: 5.7 seconds with multiple attempts to turn jar lid to open; 12/18: 2.8 sec opens in 2 turns Goal status: MET  ASSESSMENT:  CLINICAL IMPRESSION: Pt with modest improvement/carryover today with maximal daily exercise technique. She showed improved ability to self-correct for BIG and open L hand. She continues to have difficulty with sequencing for interventions 5-7 of maximal daily exercises, however, requiring occasional demo to initiate correct sequence. She was able to increase total volume of reps overall. The pt will benefit from further LSVT BIG PT in order to address these deficits to improve overall functional mobility, QOL and to decrease fall risk.  OBJECTIVE IMPAIRMENTS: Abnormal gait, decreased balance,  decreased cognition, decreased coordination, decreased endurance, decreased knowledge of use of DME, decreased mobility, difficulty walking, decreased strength, dizziness, hypomobility, impaired UE functional use, improper body mechanics, postural dysfunction, and pain.   ACTIVITY LIMITATIONS: lifting, bending, standing, squatting, stairs, transfers, bed mobility, bathing, dressing, hygiene/grooming, and locomotion level  PARTICIPATION LIMITATIONS: meal prep, cleaning, laundry, medication management, personal finances, driving, shopping, community activity, and yard work  PERSONAL FACTORS: Age, Fitness, Sex, Time since onset of injury/illness/exacerbation, and 3+ comorbidities: PMH includes hx of breast cancer, HTN, MCI, insomnia, cataracts, bipolar 1, alcohol use disorder, GAD, MDD, osteopenia of neck of R femur, chronic B low back pain with radiating pain to buttock and posterior thighs and calves. Pt with multiple comorbidities, please refer to chart for full details  are also affecting patient's functional outcome.   REHAB POTENTIAL: Fair    CLINICAL DECISION MAKING: Evolving/moderate complexity  EVALUATION COMPLEXITY: High  PLAN:  PT FREQUENCY: 4x/week  PT DURATION: 6 weeks  PLANNED INTERVENTIONS: Therapeutic exercises, Therapeutic activity, Neuromuscular re-education, Balance training, Gait training, Patient/Family education, Self Care, Joint mobilization, Stair training, Vestibular training, Canalith repositioning, Orthotic/Fit training, DME instructions, Wheelchair mobility training, Spinal mobilization, Cryotherapy, Moist heat, Splintting, Taping, Manual therapy, and Re-evaluation  PLAN FOR NEXT SESSION: adapted maximal daily exercises, functional component tasks, hierarchy tasks, big walking   Baird Kay, PT 10/28/2022, 3:45 PM

## 2022-10-28 ENCOUNTER — Ambulatory Visit: Payer: Medicare PPO

## 2022-10-28 ENCOUNTER — Ambulatory Visit: Payer: Medicare PPO | Admitting: Speech Pathology

## 2022-10-28 DIAGNOSIS — R278 Other lack of coordination: Secondary | ICD-10-CM

## 2022-10-28 DIAGNOSIS — R41841 Cognitive communication deficit: Secondary | ICD-10-CM

## 2022-10-28 DIAGNOSIS — M6281 Muscle weakness (generalized): Secondary | ICD-10-CM

## 2022-10-28 DIAGNOSIS — M25552 Pain in left hip: Secondary | ICD-10-CM

## 2022-10-28 DIAGNOSIS — R471 Dysarthria and anarthria: Secondary | ICD-10-CM

## 2022-10-28 DIAGNOSIS — M79605 Pain in left leg: Secondary | ICD-10-CM | POA: Diagnosis not present

## 2022-10-28 DIAGNOSIS — R2681 Unsteadiness on feet: Secondary | ICD-10-CM | POA: Diagnosis not present

## 2022-10-28 DIAGNOSIS — M79604 Pain in right leg: Secondary | ICD-10-CM | POA: Diagnosis not present

## 2022-10-28 DIAGNOSIS — R42 Dizziness and giddiness: Secondary | ICD-10-CM

## 2022-10-28 DIAGNOSIS — R262 Difficulty in walking, not elsewhere classified: Secondary | ICD-10-CM | POA: Diagnosis not present

## 2022-10-28 NOTE — Therapy (Signed)
OUTPATIENT SPEECH LANGUAGE PATHOLOGY TREATMENT   Patient Name: Kendra [name] MRN: [id number] DOB:02/02/1946, 76 y.o., female Today's Date: 10/28/2022    PCP: Lavon Paganini, MD REFERRING PROVIDER: Tally Joe, FNP   End of Session - 10/28/22 1108     Visit Number 14    Number of Visits 17    Date for SLP Re-Evaluation 11/28/22    SLP Start Time 67    SLP Stop Time  1200    SLP Time Calculation (min) 60 min    Activity Tolerance Patient tolerated treatment well             Past Medical History:  Diagnosis Date   Anxiety    Breast cancer ([place]) 03/2018   right breast cancer at 11:00 and 1:00   Bursitis    bilateral hips and knees   Complication of anesthesia    Depression    Family history of breast cancer 03/24/2018   Family history of lung cancer 03/24/2018   History of alcohol dependence ([place]) 2007   no ETOH; resolved since 2007   History of hiatal hernia    History of psychosis 2014   due to sleep disturbance   Hypertension    Parkinson disease    Personal history of radiation therapy 06/2018-07/2018   right breast ca   PONV (postoperative nausea and vomiting)    Past Surgical History:  Procedure Laterality Date   BREAST BIOPSY Right 03/14/2018   11:00 DCIS and invasive ductal carcinoma   BREAST BIOPSY Right 03/14/2018   1:00 Invasive ductal carcinoma   BREAST LUMPECTOMY Right 04/27/2018   lumpectomy of 11 and 1:00 cancers, clear margins, negative LN   BREAST LUMPECTOMY WITH RADIOACTIVE SEED AND SENTINEL LYMPH NODE BIOPSY Right 04/27/2018   Procedure: RIGHT BREAST LUMPECTOMY WITH RADIOACTIVE SEED X 2 AND RIGHT SENTINEL LYMPH NODE BIOPSY;  Surgeon: [name], MD;  Location: [facility];  Service: General;  Laterality: Right;   CATARACT EXTRACTION W/PHACO Left 08/30/2019   Procedure: CATARACT EXTRACTION PHACO AND INTRAOCULAR LENS PLACEMENT ([place]) LEFT panoptix toric  01:20.5  12.7%  10.26;  Surgeon: Leandrew Koyanagi, MD;   Location: [place];  Service: Ophthalmology;  Laterality: Left;   CATARACT EXTRACTION W/PHACO Right 09/20/2019   Procedure: CATARACT EXTRACTION PHACO AND INTRAOCULAR LENS PLACEMENT ([facility]) RIGHT 5.71  00:36.4  15.7%;  Surgeon: Leandrew Koyanagi, MD;  Location: [place];  Service: Ophthalmology;  Laterality: Right;   LAPAROTOMY N/A 02/10/2018   Procedure: EXPLORATORY LAPAROTOMY;  Surgeon: Isabel Caprice, MD;  Location: [facility];  Service: Gynecology;  Laterality: N/A;   MASS EXCISION  01/2018   abdominal   OVARIAN CYST REMOVAL     SALPINGOOPHORECTOMY Bilateral 02/10/2018   Procedure: BILATERAL SALPINGO OOPHORECTOMY; PERITONEAL WASHINGS;  Surgeon: Isabel Caprice, MD;  Location: [facility];  Service: Gynecology;  Laterality: Bilateral;   TONSILLECTOMY     TONSILLECTOMY AND ADENOIDECTOMY  1953   Patient Active Problem List   Diagnosis Date Noted   Blepharitis of upper and lower eyelids of both eyes 08/17/2022   Subacute cough 08/17/2022   History of allergy to shellfish 02/24/2022   Chronic low back pain of over 3 months duration 02/04/2022   Osteoarthritis of  AC (acromioclavicular) joint (Left) 02/04/2022   Osteoarthritis of hip (Right) 02/04/2022   Impaction fracture of distal end of radius and ulna, sequela (Left) 02/04/2022   Osteoarthritis of 1st (thumb) carpometacarpal (CMC) joint of hand (Left) 02/04/2022   Lumbosacral facet arthropathy (Multilevel) (Bilateral) (T12-S1)  02/04/2022   Lumbar central spinal stenosis w/o neurogenic claudication (L2-3, L3-4, L4-5) 02/04/2022   Lumbar foraminal stenosis (Bilateral: L2-3, L4-5) (Right: (Severe) L4-5) 02/04/2022   Lumbar lateral recess stenosis (Bilateral: L2-3, L3-4, and L4-5) (Severe) 02/04/2022   DDD (degenerative disc disease), lumbar 02/04/2022   Lumbosacral facet syndrome (Multilevel) (Bilateral) 02/04/2022   Osteoarthritis of lumbar spine 02/04/2022   Erythema ab igne 12/23/2021   Allergic conjunctivitis of both  eyes 12/23/2021   Chronic hip pain (3ry area of Pain) (Bilateral) (L>R) 12/22/2021   Chronic lower extremity pain (2ry area of Pain) (Bilateral) (L>R) 12/22/2021   Abnormal MRI, lumbar spine (01/31/2020) 12/22/2021   Grade 1 Anterolisthesis of lumbar spine (L3/L4) & (5 mm) (L4/L5) 12/22/2021   Chronic pain syndrome 11/17/2021   High risk medication use 11/17/2021   Disorder of skeletal system 11/17/2021   Problems influencing health status 11/17/2021   Other hyperlipidemia 10/21/2021   Polypharmacy 08/12/2021   Chronic low back pain (1ry area of Pain) (Bilateral) (L>R) w/o sciatica 06/18/2021   Elevated blood pressure reading 05/23/2021   Osteopenia of neck of femur (Right) 09/06/2020   Obesity 08/29/2020   Parkinson disease 02/22/2020   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   Bipolar disorder, in full remission, most recent episode depressed ([place]) 11/08/2019   Vitamin D insufficiency 08/24/2019   Insomnia due to mental condition 05/31/2019   Bipolar I disorder, most recent episode depressed ([place]) 05/31/2019   Alcohol use disorder, moderate, in sustained remission ([place]) 05/31/2019   Elevated tumor markers 05/27/2019   GAD (generalized anxiety disorder) 04/10/2019   MDD (major depressive disorder), severe ([place]) 01/31/2019   High serum carbohydrate antigen 19-9 (CA19-9) 12/01/2018   Adjustment disorder with anxious mood 11/24/2018   Essential hypertension 11/24/2018   B12 deficiency 10/17/2018   Incisional hernia, without obstruction or gangrene 09/12/2018   Genetic testing 04/07/2018   Family history of breast cancer 03/24/2018   Family history of lung cancer 03/24/2018   Malignant neoplasm of upper-outer quadrant of right breast in female, estrogen receptor positive ([place]) 03/17/2018   Malignant neoplasm of upper-inner quadrant of right breast in female, estrogen receptor positive ([place]) 03/17/2018   History of psychosis 02/07/2018   History of insomnia 02/07/2018    Onset date: PD dx  2021  REFERRING DIAG: Parkinson's disease  THERAPY DIAG:  Dysarthria and anarthria  Cognitive communication deficit  Rationale for Evaluation and Treatment Rehabilitation  SUBJECTIVE:   SUBJECTIVE STATEMENT:  Spouse reports he will join next session to further discuss shifting focus from LSVT to communication strategies.  Pt accompanied by:  self  PERTINENT HISTORY: Patient is a 76 y.o. female with history of Parkinson's disease (dx 2021), anxiety, depression, psychosis, HTN, bipolar disorder, alcoholism, memory issues since 2005, hx head trauma. Referred for LSVT BIG and LOUD; known to our clinic from prior course of therapy completed in 2022.  DIAGNOSTIC FINDINGS: MRI Brain 08/08/2018: "1. No acute intracranial abnormality and largely unremarkable for age gray and white matter signal. 2. NeuroQuant volumetric analysis of the brain suggests age advanced brain volume loss, see details on [facility] PACS."  PAIN:  Are you having pain? Yes: NPRS scale: 7/10 Pain location: left hip, and headache   PATIENT GOALS communicate better  OBJECTIVE:   TODAY'S TREATMENT: Pt, spouse discussed LSVT-LOUD at home and agree they would like to focus sessions after today on communication strategies. Explored use of visual feedback (graph of loudness/dB in real time) during sustained phonation, pitch glides, and phrase level reading tasks. Marginal success with  reducing loudness fade, however this visual feedback was not effective with conversation or spontaneous responses. Pt referenced not being loud enough "for you;" despite education and use of visual demonstrating her loudness below conversational norms, pt has limited insight/awareness of this. Patient reports completing both LOUD and BIG has been taxing for her mentally and physically. Discussed that if pt pursues these interventions in the future, could consider completing consecutively vs simultaneously.   PATIENT EDUCATION: Education details:  cuing strategies for home, potential areas where communication strategies may be helpful Person educated: Patient and Spouse Education method: Explanation and Verbal cues Education comprehension: verbalized understanding     GOALS: Goals reviewed with patient? Yes  SHORT TERM GOALS: Target date: 10 sessions  The patient will complete Daily Tasks (Maximum duration "ah", High/Lows, and Functional Phrases) at average loudness >/= 85 dB and with loud, good quality voice with min cues.  Baseline: 10/19/22: 81 dB Goal status: NOT MET  2.  The patient will complete Hierarchal Speech Loudness reading drills (words/phrases, sentences) at average >/= 75 dB and with loud, good quality voice with min cues. Baseline: 10/19/22: 72 dB Goal status: NOT MET  3.  The patient will participate in 5-8 minutes conversation, maintaining average loudness of 75 dB and loud, good quality voice with min cues. Baseline: 10/19/22 72 dB Goal status: NOT MET   LONG TERM GOALS: Target date:   The patient will complete Daily Tasks (Maximum duration "ah", High/Lows, and Functional Phrases) at average loudness >/= 82 dB and with loud, good quality voice Baseline:  Goal status: DEFERRED  2.  The patient will complete Hierarchal Speech Loudness reading drills (words/phrases, sentences, and paragraph) at average >/= 72 dB and with loud, good quality voice. Baseline:  Goal status: DEFERRED  3.  The patient will participate in 10 minutes conversation, maintaining average loudness of >/= 72 dB and loud, good quality voice. Baseline:  Goal status: DEFERRED  4.  Patient will report improved communication effectiveness as measured by Communicative Effectiveness Survey.  Baseline: 18/32 on 09/29/22 Goal status: IN PROGRESS   ASSESSMENT:  CLINICAL IMPRESSION: Patient is a 76 y.o. female who presents with mild-moderate dysarthria characterized by reduced vocal intensity, reduced pitch variability, hoarse vocal  quality, and subjectively low pitch for gender. Progress with loudness and vocal quality plateaued last week, and due to increased need for cuing during LSVT tasks, pt and spouse in agreement with shifting focus to communication strategies. Pt and spouse to discuss areas of functional impairment to aid in determining routines and strategies that may be effective to improve quality of life. Will modify goals as appropriate during upcoming session.  OBJECTIVE IMPAIRMENTS include dysarthria and voice disorder. These impairments are limiting patient from effectively communicating at home and in community. Factors affecting potential to achieve goals and functional outcome are ability to learn/carryover information and previous level of function.. Patient will benefit from skilled SLP services to address above impairments and improve overall function.  REHAB POTENTIAL: Good  PLAN: SLP FREQUENCY: 4x/week  SLP DURATION: 4 weeks  PLANNED INTERVENTIONS: Cueing hierachy, Functional tasks, SLP instruction and feedback, Compensatory strategies, Patient/family education, and LSVT-LOUD      Deneise Lever, MS, Actor [phone]

## 2022-10-28 NOTE — Therapy (Signed)
OUTPATIENT SPEECH LANGUAGE PATHOLOGY LSVT LOUD TREATMENT   Patient Name: Kendra [name] MRN: [id number] DOB:[birth year], 76 y.o., female Today's Date: 10/28/2022    PCP: Lavon Paganini, MD REFERRING PROVIDER: Tally Joe, FNP   End of Session - 10/28/22 0823     Visit Number 13    Number of Visits 17    Date for SLP Re-Evaluation 11/28/22    SLP Start Time 69    SLP Stop Time  1245    SLP Time Calculation (min) 75 min    Activity Tolerance Patient tolerated treatment well             Past Medical History:  Diagnosis Date   Anxiety    Breast cancer ([place]) 03/2018   right breast cancer at 11:00 and 1:00   Bursitis    bilateral hips and knees   Complication of anesthesia    Depression    Family history of breast cancer 03/24/2018   Family history of lung cancer 03/24/2018   History of alcohol dependence ([place]) 2007   no ETOH; resolved since 2007   History of hiatal hernia    History of psychosis 2014   due to sleep disturbance   Hypertension    Parkinson disease    Personal history of radiation therapy 06/2018-07/2018   right breast ca   PONV (postoperative nausea and vomiting)    Past Surgical History:  Procedure Laterality Date   BREAST BIOPSY Right 03/14/2018   11:00 DCIS and invasive ductal carcinoma   BREAST BIOPSY Right 03/14/2018   1:00 Invasive ductal carcinoma   BREAST LUMPECTOMY Right 04/27/2018   lumpectomy of 11 and 1:00 cancers, clear margins, negative LN   BREAST LUMPECTOMY WITH RADIOACTIVE SEED AND SENTINEL LYMPH NODE BIOPSY Right 04/27/2018   Procedure: RIGHT BREAST LUMPECTOMY WITH RADIOACTIVE SEED X 2 AND RIGHT SENTINEL LYMPH NODE BIOPSY;  Surgeon: [name], MD;  Location: [facility];  Service: General;  Laterality: Right;   CATARACT EXTRACTION W/PHACO Left 08/30/2019   Procedure: CATARACT EXTRACTION PHACO AND INTRAOCULAR LENS PLACEMENT ([place]) LEFT panoptix toric  01:20.5  12.7%  10.26;  Surgeon: Leandrew Koyanagi,  MD;  Location: [place];  Service: Ophthalmology;  Laterality: Left;   CATARACT EXTRACTION W/PHACO Right 09/20/2019   Procedure: CATARACT EXTRACTION PHACO AND INTRAOCULAR LENS PLACEMENT ([facility]) RIGHT 5.71  00:36.4  15.7%;  Surgeon: Leandrew Koyanagi, MD;  Location: [facility];  Service: Ophthalmology;  Laterality: Right;   LAPAROTOMY N/A 02/10/2018   Procedure: EXPLORATORY LAPAROTOMY;  Surgeon: Isabel Caprice, MD;  [facility];  Service: Gynecology;  Laterality: N/A;   MASS EXCISION  01/2018   abdominal   OVARIAN CYST REMOVAL     SALPINGOOPHORECTOMY Bilateral 02/10/2018   Procedure: BILATERAL SALPINGO OOPHORECTOMY; PERITONEAL WASHINGS;  Surgeon: Isabel Caprice, MD;  [facility];  Service: Gynecology;  Laterality: Bilateral;   TONSILLECTOMY     TONSILLECTOMY AND ADENOIDECTOMY  1953   Patient Active Problem List   Diagnosis Date Noted   Blepharitis of upper and lower eyelids of both eyes 08/17/2022   Subacute cough 08/17/2022   History of allergy to shellfish 02/24/2022   Chronic low back pain of over 3 months duration 02/04/2022   Osteoarthritis of  AC (acromioclavicular) joint (Left) 02/04/2022   Osteoarthritis of hip (Right) 02/04/2022   Impaction fracture of distal end of radius and ulna, sequela (Left) 02/04/2022   Osteoarthritis of 1st (thumb) carpometacarpal (CMC) joint of hand (Left) 02/04/2022   Lumbosacral facet arthropathy (Multilevel) (  Bilateral) (T12-S1) 02/04/2022   Lumbar central spinal stenosis w/o neurogenic claudication (L2-3, L3-4, L4-5) 02/04/2022   Lumbar foraminal stenosis (Bilateral: L2-3, L4-5) (Right: (Severe) L4-5) 02/04/2022   Lumbar lateral recess stenosis (Bilateral: L2-3, L3-4, and L4-5) (Severe) 02/04/2022   DDD (degenerative disc disease), lumbar 02/04/2022   Lumbosacral facet syndrome (Multilevel) (Bilateral) 02/04/2022   Osteoarthritis of lumbar spine 02/04/2022   Erythema ab igne 12/23/2021   Allergic conjunctivitis of  both eyes 12/23/2021   Chronic hip pain (3ry area of Pain) (Bilateral) (L>R) 12/22/2021   Chronic lower extremity pain (2ry area of Pain) (Bilateral) (L>R) 12/22/2021   Abnormal MRI, lumbar spine (01/31/2020) 12/22/2021   Grade 1 Anterolisthesis of lumbar spine (L3/L4) & (5 mm) (L4/L5) 12/22/2021   Chronic pain syndrome 11/17/2021   High risk medication use 11/17/2021   Disorder of skeletal system 11/17/2021   Problems influencing health status 11/17/2021   Other hyperlipidemia 10/21/2021   Polypharmacy 08/12/2021   Chronic low back pain (1ry area of Pain) (Bilateral) (L>R) w/o sciatica 06/18/2021   Elevated blood pressure reading 05/23/2021   Osteopenia of neck of femur (Right) 09/06/2020   Obesity 08/29/2020   Parkinson disease 02/22/2020   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   Bipolar disorder, in full remission, most recent episode depressed ([place]) 11/08/2019   Vitamin D insufficiency 08/24/2019   Insomnia due to mental condition 05/31/2019   Bipolar I disorder, most recent episode depressed ([place]) 05/31/2019   Alcohol use disorder, moderate, in sustained remission ([place]) 05/31/2019   Elevated tumor markers 05/27/2019   GAD (generalized anxiety disorder) 04/10/2019   MDD (major depressive disorder), severe ([place]) 01/31/2019   High serum carbohydrate antigen 19-9 (CA19-9) 12/01/2018   Adjustment disorder with anxious mood 11/24/2018   Essential hypertension 11/24/2018   B12 deficiency 10/17/2018   Incisional hernia, without obstruction or gangrene 09/12/2018   Genetic testing 04/07/2018   Family history of breast cancer 03/24/2018   Family history of lung cancer 03/24/2018   Malignant neoplasm of upper-outer quadrant of right breast in female, estrogen receptor positive ([place]) 03/17/2018   Malignant neoplasm of upper-inner quadrant of right breast in female, estrogen receptor positive ([place]) 03/17/2018   History of psychosis 02/07/2018   History of insomnia 02/07/2018    Onset date:  PD dx 2021  REFERRING DIAG: Parkinson's disease  THERAPY DIAG:  Dysarthria and anarthria  Cognitive communication deficit  Rationale for Evaluation and Treatment Rehabilitation  SUBJECTIVE:   SUBJECTIVE STATEMENT: "I'm trying to figure out why it's so hard." (To be loud)  Pt accompanied by:  self  PERTINENT HISTORY: Patient is a 76 y.o. female with history of Parkinson's disease (dx 2021), anxiety, depression, psychosis, HTN, bipolar disorder, alcoholism, memory issues since 2005, hx head trauma. Referred for LSVT BIG and LOUD; known to our clinic from prior course of therapy completed in 2022.  DIAGNOSTIC FINDINGS: MRI Brain 08/08/2018: "1. No acute intracranial abnormality and largely unremarkable for age gray and white matter signal. 2. NeuroQuant volumetric analysis of the brain suggests age advanced brain volume loss, see details on [facility] PACS."  PAIN:  Are you having pain? Yes: NPRS scale: 7/10 Pain location: left hip   PATIENT GOALS communicate better  OBJECTIVE:   TODAY'S TREATMENT: Usual mod cues necessary during LSVT Daily tasks. Loud /a/ averaged 79 dB, avg duration 10.7 seconds, with modeling necessary for breath support, as well as verbal cues during duration of /a/ to reduce loudness fade. Max F0 for pitch glides was 294 Hz (79 dB avg), min F0  for low glides was 160 Hz (78 dB avg), occasional mod cues. Functional phrases averaged 72 dB with usual mod cues (repetition after therapist and verbal cues for breath support). Despite recalibiration efforts and cuing for effort level, reducing task complexity, pt stimulability for improved vocal quality/loudness was not effective. Requested spouse join (arrived to wait for pt in hallway with 10 minutes remaining in session). Initiated discussion re: consistent need for cuing and minimal carryover between sessions, outside of ST. Pt/spouse report consistent practice at home. Addressed pt frustration and discouragement, and  discussed alternatives such as focus on communication strategies and effectiveness. Pt and spouse to discuss at home and determine if they want to focus remainder of sessions on strategies vs LSVT.  PATIENT EDUCATION: Education details: cuing strategies for home Person educated: Patient and Spouse Education method: Explanation and Verbal cues Education comprehension: verbalized understanding   HOME EXERCISE PROGRAM: To be provided next session     GOALS: Goals reviewed with patient? Yes  SHORT TERM GOALS: Target date: 10 sessions  The patient will complete Daily Tasks (Maximum duration "ah", High/Lows, and Functional Phrases) at average loudness >/= 85 dB and with loud, good quality voice with min cues.  Baseline: 10/19/22: 81 dB Goal status: NOT MET  2.  The patient will complete Hierarchal Speech Loudness reading drills (words/phrases, sentences) at average >/= 75 dB and with loud, good quality voice with min cues. Baseline: 10/19/22: 72 dB Goal status: NOT MET  3.  The patient will participate in 5-8 minutes conversation, maintaining average loudness of 75 dB and loud, good quality voice with min cues. Baseline: 10/19/22 72 dB Goal status: NOT MET   LONG TERM GOALS: Target date:   The patient will complete Daily Tasks (Maximum duration "ah", High/Lows, and Functional Phrases) at average loudness >/= 82 dB and with loud, good quality voice Baseline:  Goal status: REVISED  2.  The patient will complete Hierarchal Speech Loudness reading drills (words/phrases, sentences, and paragraph) at average >/= 72 dB and with loud, good quality voice. Baseline:  Goal status: REVISED  3.  The patient will participate in 10 minutes conversation, maintaining average loudness of >/= 72 dB and loud, good quality voice. Baseline:  Goal status: REVISED  4.  Patient will report improved communication effectiveness as measured by Communicative Effectiveness Survey.  Baseline: 18/32 on  09/29/22 Goal status: IN PROGRESS   ASSESSMENT:  CLINICAL IMPRESSION: Patient is a 76 y.o. female who presents with mild-moderate dysarthria characterized by reduced vocal intensity, reduced pitch variability, hoarse vocal quality, and subjectively low pitch for gender. Progress with loudness and vocal quality plateaued last week, and usual mod cues necessary during LSVT tasks today, as well as for spontaneous responses between structured tasks. Despite feedback with audio recording (pt acknowledges voice sounds "too quiet"), reduced cognitive load, incorporation of topics of pt interest, appears to have reduced motivation and responsiveness to cues/modeling has declined over past 4-5 sessions. Discussed changing treatment approach from LSVT to focus on communication strategies and partner training. Patient and spouse wish to discuss at home; will modify goals as appropriate during upcoming session.  OBJECTIVE IMPAIRMENTS include dysarthria and voice disorder. These impairments are limiting patient from effectively communicating at home and in community. Factors affecting potential to achieve goals and functional outcome are ability to learn/carryover information and previous level of function.. Patient will benefit from skilled SLP services to address above impairments and improve overall function.  REHAB POTENTIAL: Good  PLAN: SLP FREQUENCY: 4x/week  SLP  DURATION: 4 weeks  PLANNED INTERVENTIONS: Cueing hierachy, Functional tasks, SLP instruction and feedback, Compensatory strategies, Patient/family education, and LSVT-LOUD      Deneise Lever, MS, Actor [phone]

## 2022-10-29 ENCOUNTER — Ambulatory Visit: Payer: Medicare PPO

## 2022-10-29 ENCOUNTER — Ambulatory Visit: Payer: Medicare PPO | Admitting: Speech Pathology

## 2022-10-29 DIAGNOSIS — M6281 Muscle weakness (generalized): Secondary | ICD-10-CM

## 2022-10-29 DIAGNOSIS — R41841 Cognitive communication deficit: Secondary | ICD-10-CM

## 2022-10-29 DIAGNOSIS — M79605 Pain in left leg: Secondary | ICD-10-CM | POA: Diagnosis not present

## 2022-10-29 DIAGNOSIS — R2681 Unsteadiness on feet: Secondary | ICD-10-CM

## 2022-10-29 DIAGNOSIS — R278 Other lack of coordination: Secondary | ICD-10-CM | POA: Diagnosis not present

## 2022-10-29 DIAGNOSIS — R471 Dysarthria and anarthria: Secondary | ICD-10-CM

## 2022-10-29 DIAGNOSIS — M79604 Pain in right leg: Secondary | ICD-10-CM | POA: Diagnosis not present

## 2022-10-29 DIAGNOSIS — M25552 Pain in left hip: Secondary | ICD-10-CM | POA: Diagnosis not present

## 2022-10-29 DIAGNOSIS — R262 Difficulty in walking, not elsewhere classified: Secondary | ICD-10-CM | POA: Diagnosis not present

## 2022-10-29 NOTE — Patient Instructions (Signed)
Aspects of Parkinson's addressed by speech therapists  Communication  Voice and speech: how clearly your sounds come out and are understood by others Expressive language: Putting your thoughts and words together Receptive language: Understanding and processing what you hear and read Cognition Memory Attention/focus Higher level functions such as problem solving, organization 3. Swallowing  a. Difficulty with chewing or managing saliva b. Feeling foods sticking in your throat, or getting "choked" or "strangled" or things going down the "wrong pipe"    We talked today about the areas Kamalei notices difficulty. Feeling overwhelmed and exhausted during and after therapy Not remembering something she just heard Difficulty making choices or answering open-ended questions Remembering the names of family/friends Remembering the schedule Putting words together or "blanking" when someone asks a question.  We talked about how there is not a way to "fix" those problems, but that there are some strategies that might reduce frustration or help resolve breakdowns.  Use a routine. Routines help make things predictable and reduce the demand on your system, allowing you to be more independent. If you need to add a new task to your day, I recommend pairing it with a routine that is already successful.  Visual supports:  write down choices (activities, meals, movie selections) to help Maeola process the information.  Write down key words and main ideas during a conversation. This will help Catharina process, but also help her come up with words to respond. It might be helpful to create a "memory book" with photos of family and friends and their names, plus any events/milestones/trips that you want to remember. Use a written schedule to help Yuritza remember completed and upcoming activities 3. Chunking information: Give new information one piece at a time. If Karita has difficulty processing it, you can try to  rephrase it, and you can write things down as well. Make sure new information is processed before moving on to the next thing. Communicate clearly if you are changing the topic of conversation.

## 2022-10-29 NOTE — Therapy (Signed)
OUTPATIENT SPEECH LANGUAGE PATHOLOGY TREATMENT   Patient Name: Taneya Conkel MRN: [id number] DOB:07/07/1946, 76 y.o., female Today's Date: 10/29/2022    PCP: Lavon Paganini, MD REFERRING PROVIDER: Tally Joe, FNP   End of Session - 10/29/22 1058     Visit Number 15    Number of Visits 17    Date for SLP Re-Evaluation 11/28/22    SLP Start Time 66    SLP Stop Time  1200    SLP Time Calculation (min) 60 min    Activity Tolerance Patient tolerated treatment well             Past Medical History:  Diagnosis Date   Anxiety    Breast cancer ([place]) 03/2018   right breast cancer at 11:00 and 1:00   Bursitis    bilateral hips and knees   Complication of anesthesia    Depression    Family history of breast cancer 03/24/2018   Family history of lung cancer 03/24/2018   History of alcohol dependence ([place]) 2007   no ETOH; resolved since 2007   History of hiatal hernia    History of psychosis 2014   due to sleep disturbance   Hypertension    Parkinson disease    Personal history of radiation therapy 06/2018-07/2018   right breast ca   PONV (postoperative nausea and vomiting)    Past Surgical History:  Procedure Laterality Date   BREAST BIOPSY Right 03/14/2018   11:00 DCIS and invasive ductal carcinoma   BREAST BIOPSY Right 03/14/2018   1:00 Invasive ductal carcinoma   BREAST LUMPECTOMY Right 04/27/2018   lumpectomy of 11 and 1:00 cancers, clear margins, negative LN   BREAST LUMPECTOMY WITH RADIOACTIVE SEED AND SENTINEL LYMPH NODE BIOPSY Right 04/27/2018   Procedure: RIGHT BREAST LUMPECTOMY WITH RADIOACTIVE SEED X 2 AND RIGHT SENTINEL LYMPH NODE BIOPSY;  Surgeon: [name], MD;  Location: [facility];  Service: General;  Laterality: Right;   CATARACT EXTRACTION W/PHACO Left 08/30/2019   Procedure: CATARACT EXTRACTION PHACO AND INTRAOCULAR LENS PLACEMENT ([place]) LEFT panoptix toric  01:20.5  12.7%  10.26;  Surgeon: Leandrew Koyanagi, MD;   Location: [facility];  Service: Ophthalmology;  Laterality: Left;   CATARACT EXTRACTION W/PHACO Right 09/20/2019   Procedure: CATARACT EXTRACTION PHACO AND INTRAOCULAR LENS PLACEMENT ([facility]) RIGHT 5.71  00:36.4  15.7%;  Surgeon: Leandrew Koyanagi, MD;  Location: [facility];  Service: Ophthalmology;  Laterality: Right;   LAPAROTOMY N/A 02/10/2018   Procedure: EXPLORATORY LAPAROTOMY;  Surgeon: Isabel Caprice, MD;  Location: [facility];  Service: Gynecology;  Laterality: N/A;   MASS EXCISION  01/2018   abdominal   OVARIAN CYST REMOVAL     SALPINGOOPHORECTOMY Bilateral 02/10/2018   Procedure: BILATERAL SALPINGO OOPHORECTOMY; PERITONEAL WASHINGS;  Surgeon: Isabel Caprice, MD;  Location: [facility];  Service: Gynecology;  Laterality: Bilateral;   TONSILLECTOMY     TONSILLECTOMY AND ADENOIDECTOMY  1953   Patient Active Problem List   Diagnosis Date Noted   Blepharitis of upper and lower eyelids of both eyes 08/17/2022   Subacute cough 08/17/2022   History of allergy to shellfish 02/24/2022   Chronic low back pain of over 3 months duration 02/04/2022   Osteoarthritis of  AC (acromioclavicular) joint (Left) 02/04/2022   Osteoarthritis of hip (Right) 02/04/2022   Impaction fracture of distal end of radius and ulna, sequela (Left) 02/04/2022   Osteoarthritis of 1st (thumb) carpometacarpal (CMC) joint of hand (Left) 02/04/2022   Lumbosacral facet arthropathy (Multilevel) (Bilateral) (T12-S1)  02/04/2022   Lumbar central spinal stenosis w/o neurogenic claudication (L2-3, L3-4, L4-5) 02/04/2022   Lumbar foraminal stenosis (Bilateral: L2-3, L4-5) (Right: (Severe) L4-5) 02/04/2022   Lumbar lateral recess stenosis (Bilateral: L2-3, L3-4, and L4-5) (Severe) 02/04/2022   DDD (degenerative disc disease), lumbar 02/04/2022   Lumbosacral facet syndrome (Multilevel) (Bilateral) 02/04/2022   Osteoarthritis of lumbar spine 02/04/2022   Erythema ab igne 12/23/2021   Allergic conjunctivitis of both  eyes 12/23/2021   Chronic hip pain (3ry area of Pain) (Bilateral) (L>R) 12/22/2021   Chronic lower extremity pain (2ry area of Pain) (Bilateral) (L>R) 12/22/2021   Abnormal MRI, lumbar spine (01/31/2020) 12/22/2021   Grade 1 Anterolisthesis of lumbar spine (L3/L4) & (5 mm) (L4/L5) 12/22/2021   Chronic pain syndrome 11/17/2021   High risk medication use 11/17/2021   Disorder of skeletal system 11/17/2021   Problems influencing health status 11/17/2021   Other hyperlipidemia 10/21/2021   Polypharmacy 08/12/2021   Chronic low back pain (1ry area of Pain) (Bilateral) (L>R) w/o sciatica 06/18/2021   Elevated blood pressure reading 05/23/2021   Osteopenia of neck of femur (Right) 09/06/2020   Obesity 08/29/2020   Parkinson disease 02/22/2020   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   Bipolar disorder, in full remission, most recent episode depressed ([place]) 11/08/2019   Vitamin D insufficiency 08/24/2019   Insomnia due to mental condition 05/31/2019   Bipolar I disorder, most recent episode depressed ([place]) 05/31/2019   Alcohol use disorder, moderate, in sustained remission ([place]) 05/31/2019   Elevated tumor markers 05/27/2019   GAD (generalized anxiety disorder) 04/10/2019   MDD (major depressive disorder), severe ([place]) 01/31/2019   High serum carbohydrate antigen 19-9 (CA19-9) 12/01/2018   Adjustment disorder with anxious mood 11/24/2018   Essential hypertension 11/24/2018   B12 deficiency 10/17/2018   Incisional hernia, without obstruction or gangrene 09/12/2018   Genetic testing 04/07/2018   Family history of breast cancer 03/24/2018   Family history of lung cancer 03/24/2018   Malignant neoplasm of upper-outer quadrant of right breast in female, estrogen receptor positive ([place]) 03/17/2018   Malignant neoplasm of upper-inner quadrant of right breast in female, estrogen receptor positive ([place]) 03/17/2018   History of psychosis 02/07/2018   History of insomnia 02/07/2018    Onset date: PD dx  2021  REFERRING DIAG: Parkinson's disease  THERAPY DIAG:  Dysarthria and anarthria  Cognitive communication deficit  Rationale for Evaluation and Treatment Rehabilitation  SUBJECTIVE:   SUBJECTIVE STATEMENT:  Pt alone for session today  Pt accompanied by:  self  PERTINENT HISTORY: Patient is a 75 y.o. female with history of Parkinson's disease (dx 2021), anxiety, depression, psychosis, HTN, bipolar disorder, alcoholism, memory issues since 2005, hx head trauma. Referred for LSVT BIG and LOUD; known to our clinic from prior course of therapy completed in 2022.  DIAGNOSTIC FINDINGS: MRI Brain 08/08/2018: "1. No acute intracranial abnormality and largely unremarkable for age gray and white matter signal. 2. NeuroQuant volumetric analysis of the brain suggests age advanced brain volume loss, see details on [facility] PACS."  PAIN:  Are you having pain? Yes: NPRS scale: 7/10 Pain location: left and right hips   PATIENT GOALS communicate better  OBJECTIVE:   TODAY'S TREATMENT: Facilitated goal setting used supported conversation and written keywords to increase pt participation in conversation. Educated on role of SLP in managing various symptoms related to Parkinson's; pt identified areas of difficulty including: Becoming cognitively fatigued, difficulty recalling conversations, schedule, and names of friends/family members, wordfinding difficulties, and "blanking" when someone asks her a question.  Pt agreed that written keywords and questions were helpful. When questions presented verbally pt often hesitated and responded, "I don't know," but was able to provide a response when provided in written form. Spouse did not arrive until end of session, so provided summary of discussion and proposed strategies in written form. Spouse plans to attend next session for further instruction/training in implementing strategies. See pt instructions for details.   PATIENT EDUCATION: Education details:  cuing strategies for home, potential areas where communication strategies may be helpful Person educated: Patient and Spouse Education method: Explanation and Verbal cues Education comprehension: verbalized understanding     GOALS: Goals reviewed with patient? Yes  SHORT TERM GOALS: Target date: 10 sessions  The patient will complete Daily Tasks (Maximum duration "ah", High/Lows, and Functional Phrases) at average loudness >/= 85 dB and with loud, good quality voice with min cues.  Baseline: 10/19/22: 81 dB Goal status: NOT MET  2.  The patient will complete Hierarchal Speech Loudness reading drills (words/phrases, sentences) at average >/= 75 dB and with loud, good quality voice with min cues. Baseline: 10/19/22: 72 dB Goal status: NOT MET  3.  The patient will participate in 5-8 minutes conversation, maintaining average loudness of 75 dB and loud, good quality voice with min cues. Baseline: 10/19/22 72 dB Goal status: NOT MET   LONG TERM GOALS: Target date:   The patient will complete Daily Tasks (Maximum duration "ah", High/Lows, and Functional Phrases) at average loudness >/= 82 dB and with loud, good quality voice Baseline:  Goal status: DEFERRED  2.  The patient will complete Hierarchal Speech Loudness reading drills (words/phrases, sentences, and paragraph) at average >/= 72 dB and with loud, good quality voice. Baseline:  Goal status: DEFERRED  3.  The patient will participate in 10 minutes conversation, maintaining average loudness of >/= 72 dB and loud, good quality voice. Baseline:  Goal status: DEFERRED  4.  Patient will report improved communication effectiveness as measured by Communicative Effectiveness Survey.  Baseline: 18/32 on 09/29/22 Goal status: IN PROGRESS  5. Patient/spouse will demonstrate understanding of strategies to increase pt's communication effectiveness at home (supported conversation, chunking information, written choices,  etc).  Baseline:  Goal status: INITIAL   ASSESSMENT:  CLINICAL IMPRESSION: Patient is a 76 y.o. female who presents with mild-moderate dysarthria characterized by reduced vocal intensity, reduced pitch variability, hoarse vocal quality, and subjectively low pitch for gender. Progress with loudness and vocal quality plateaued last week, and due to increased need for cuing during LSVT tasks, pt and spouse in agreement with shifting focus to communication strategies. Pt and spouse to discuss areas of functional impairment to aid in determining routines and strategies that may be effective to improve quality of life. LT goals updated.  OBJECTIVE IMPAIRMENTS include dysarthria and voice disorder. These impairments are limiting patient from effectively communicating at home and in community. Factors affecting potential to achieve goals and functional outcome are ability to learn/carryover information and previous level of function.. Patient will benefit from skilled SLP services to address above impairments and improve overall function.  REHAB POTENTIAL: Good  PLAN: SLP FREQUENCY: 4x/week  SLP DURATION: 4 weeks  PLANNED INTERVENTIONS: Cueing hierachy, Functional tasks, SLP instruction and feedback, Compensatory strategies, Patient/family education, and LSVT-LOUD      Deneise Lever, MS, Actor [phone]

## 2022-10-29 NOTE — Therapy (Signed)
OUTPATIENT PHYSICAL THERAPY LSVT BIG TREATMENT   Patient Name: Kendra [name] MRN: [id number] DOB:03-02-1946, 76 y.o., female Today's Date: 10/29/2022  PCP: Virginia Crews, MD REFERRING PROVIDER: Virginia Crews, MD  END OF SESSION:  PT End of Session - 10/29/22 1008     Visit Number 15    Number of Visits 17    Date for PT Re-Evaluation 11/10/22    PT Start Time 1005    PT Stop Time 1059    PT Time Calculation (min) 54 min    Equipment Utilized During Treatment Gait belt    Activity Tolerance Patient tolerated treatment well;Patient limited by fatigue;No increased pain;Patient limited by pain    Behavior During Therapy [facility] for tasks assessed/performed                  Past Medical History:  Diagnosis Date   Anxiety    Breast cancer ([place]) 03/2018   right breast cancer at 11:00 and 1:00   Bursitis    bilateral hips and knees   Complication of anesthesia    Depression    Family history of breast cancer 03/24/2018   Family history of lung cancer 03/24/2018   History of alcohol dependence ([place]) 2007   no ETOH; resolved since 2007   History of hiatal hernia    History of psychosis 2014   due to sleep disturbance   Hypertension    Parkinson disease    Personal history of radiation therapy 06/2018-07/2018   right breast ca   PONV (postoperative nausea and vomiting)    Past Surgical History:  Procedure Laterality Date   BREAST BIOPSY Right 03/14/2018   11:00 DCIS and invasive ductal carcinoma   BREAST BIOPSY Right 03/14/2018   1:00 Invasive ductal carcinoma   BREAST LUMPECTOMY Right 04/27/2018   lumpectomy of 11 and 1:00 cancers, clear margins, negative LN   BREAST LUMPECTOMY WITH RADIOACTIVE SEED AND SENTINEL LYMPH NODE BIOPSY Right 04/27/2018   Procedure: RIGHT BREAST LUMPECTOMY WITH RADIOACTIVE SEED X 2 AND RIGHT SENTINEL LYMPH NODE BIOPSY;  Surgeon: [name], MD;  Location: [place];  Service: General;  Laterality: Right;    CATARACT EXTRACTION W/PHACO Left 08/30/2019   Procedure: CATARACT EXTRACTION PHACO AND INTRAOCULAR LENS PLACEMENT ([place]) LEFT panoptix toric  01:20.5  12.7%  10.26;  Surgeon: Leandrew Koyanagi, MD;  Location: [place];  Service: Ophthalmology;  Laterality: Left;   CATARACT EXTRACTION W/PHACO Right 09/20/2019   Procedure: CATARACT EXTRACTION PHACO AND INTRAOCULAR LENS PLACEMENT ([facility]) RIGHT 5.71  00:36.4  15.7%;  Surgeon: Leandrew Koyanagi, MD;  Location: [place];  Service: Ophthalmology;  Laterality: Right;   LAPAROTOMY N/A 02/10/2018   Procedure: EXPLORATORY LAPAROTOMY;  Surgeon: Isabel Caprice, MD;  Location: [facility];  Service: Gynecology;  Laterality: N/A;   MASS EXCISION  01/2018   abdominal   OVARIAN CYST REMOVAL     SALPINGOOPHORECTOMY Bilateral 02/10/2018   Procedure: BILATERAL SALPINGO OOPHORECTOMY; PERITONEAL WASHINGS;  Surgeon: Isabel Caprice, MD;  Location: [facility];  Service: Gynecology;  Laterality: Bilateral;   TONSILLECTOMY     TONSILLECTOMY AND ADENOIDECTOMY  1953   Patient Active Problem List   Diagnosis Date Noted   Blepharitis of upper and lower eyelids of both eyes 08/17/2022   Subacute cough 08/17/2022   History of allergy to shellfish 02/24/2022   Chronic low back pain of over 3 months duration 02/04/2022   Osteoarthritis of  AC (acromioclavicular) joint (Left) 02/04/2022   Osteoarthritis of hip (Right)  02/04/2022   Impaction fracture of distal end of radius and ulna, sequela (Left) 02/04/2022   Osteoarthritis of 1st (thumb) carpometacarpal (CMC) joint of hand (Left) 02/04/2022   Lumbosacral facet arthropathy (Multilevel) (Bilateral) (T12-S1) 02/04/2022   Lumbar central spinal stenosis w/o neurogenic claudication (L2-3, L3-4, L4-5) 02/04/2022   Lumbar foraminal stenosis (Bilateral: L2-3, L4-5) (Right: (Severe) L4-5) 02/04/2022   Lumbar lateral recess stenosis (Bilateral: L2-3, L3-4, and L4-5) (Severe) 02/04/2022   DDD (degenerative disc  disease), lumbar 02/04/2022   Lumbosacral facet syndrome (Multilevel) (Bilateral) 02/04/2022   Osteoarthritis of lumbar spine 02/04/2022   Erythema ab igne 12/23/2021   Allergic conjunctivitis of both eyes 12/23/2021   Chronic hip pain (3ry area of Pain) (Bilateral) (L>R) 12/22/2021   Chronic lower extremity pain (2ry area of Pain) (Bilateral) (L>R) 12/22/2021   Abnormal MRI, lumbar spine (01/31/2020) 12/22/2021   Grade 1 Anterolisthesis of lumbar spine (L3/L4) & (5 mm) (L4/L5) 12/22/2021   Chronic pain syndrome 11/17/2021   High risk medication use 11/17/2021   Disorder of skeletal system 11/17/2021   Problems influencing health status 11/17/2021   Other hyperlipidemia 10/21/2021   Polypharmacy 08/12/2021   Chronic low back pain (1ry area of Pain) (Bilateral) (L>R) w/o sciatica 06/18/2021   Elevated blood pressure reading 05/23/2021   Osteopenia of neck of femur (Right) 09/06/2020   Obesity 08/29/2020   Parkinson disease 02/22/2020   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   Bipolar disorder, in full remission, most recent episode depressed ([place]) 11/08/2019   Vitamin D insufficiency 08/24/2019   Insomnia due to mental condition 05/31/2019   Bipolar I disorder, most recent episode depressed ([place]) 05/31/2019   Alcohol use disorder, moderate, in sustained remission ([place]) 05/31/2019   Elevated tumor markers 05/27/2019   GAD (generalized anxiety disorder) 04/10/2019   MDD (major depressive disorder), severe ([place]) 01/31/2019   High serum carbohydrate antigen 19-9 (CA19-9) 12/01/2018   Adjustment disorder with anxious mood 11/24/2018   Essential hypertension 11/24/2018   B12 deficiency 10/17/2018   Incisional hernia, without obstruction or gangrene 09/12/2018   Genetic testing 04/07/2018   Family history of breast cancer 03/24/2018   Family history of lung cancer 03/24/2018   Malignant neoplasm of upper-outer quadrant of right breast in female, estrogen receptor positive ([place]) 03/17/2018    Malignant neoplasm of upper-inner quadrant of right breast in female, estrogen receptor positive ([place]) 03/17/2018   History of psychosis 02/07/2018   History of insomnia 02/07/2018    ONSET DATE: Pt diagnosed with PD in 2019  REFERRING DIAG:  G20 (ICD-10-CM) - Parkinson's disease      THERAPY DIAG:  Other lack of coordination  Unsteadiness on feet  Difficulty in walking, not elsewhere classified  Muscle weakness (generalized)  Pain in left hip  Rationale for Evaluation and Treatment: Rehabilitation  SUBJECTIVE: Pt reports usual ache/pain in L hip. She reports no stumbles/falls.                                                                             Pain: reports her hips feel OK currently Pt accompanied by: significant other self  PERTINENT HISTORY:  Pt is a pleasant 76 yo female who was diagnosed with PD in 2019. She is known to clinic  and [facility], and was last seen for LSVT BIG Sept-Oct 2022. Pt with chronic pain and has become increasingly sedentary as a result exacerbating mobility difficulties due to PD. Current concerns: Pt reports L and R hip pain and back pain. Pt was being seen by pain management clinic. Pt has had injections that provided relief at the time. Pt's spouse reports her physician determined surgery was not an option for her pain so the plan is to manage it. Pt spouse thinks pain has resulted in reluctance for pt to move. Pt currently sedentary, doesn't walk much, but when she does she holds on to furniture for support due to balance problems. When pt first stands she must hold on to something due to mix of pain and/or dizziness (described as lightheadedness). Her pain can go from 3/10 to 9/10. It does improve once she starts moving. Pt has increased difficulty with multiple activities including: getting in and out of her bed, getting in and out of her car, freezing, decreased gait speed, decreased balance and cognition, and difficulty using BUE due to  tremors.  Per chart other PMH includes hx of breast cancer, HTN, MCI, insomnia, cataracts, bipolar 1, alcohol use disorder, GAD, MDD, osteopenia of neck of R femur, chronic B low back pain with radiating pain to buttock and posterior thighs and calves. Pt with multiple comorbidities, please refer to chart for full details    PAIN: from eval Are you having pain? Yes: NPRS scale: not rated currently but can reach 9/10 Pain location: L hip and L leg Pain description: pt has difficulty describing Aggravating factors: unsure Relieving factors: laying in the recliner helps, movement  PRECAUTIONS: Fall  WEIGHT BEARING RESTRICTIONS: No  FALLS: Has patient fallen in last 6 months? Yes. Number of falls 1x, leg tangled in chair leg when getting up from a chair  LIVING ENVIRONMENT: Lives with: lives with their spouse Lives in: House/apartment Stairs:  Has following equipment at home:  pt not currently using AD, holds onto walls/furniture, does have walker and SPC at home  PLOF: Independent with basic ADLs  PATIENT GOALS: improve mobility  IMAGING: via chart  07/24/22 DG BONE density " ASSESSMENT: The probability of a major osteoporotic fracture is 15.8% within the next ten years.   The probability of a hip fracture is 2.9% within the next ten years."  12/22/21 DG lumbar spine " IMPRESSION: 1. No acute displaced fracture or traumatic listhesis of the lumbar spine. 2. Interval development of grade 1 anterolisthesis of L3 on L4. Stable grade 1 anterolisthesis of L4 on L5. Appears stable on flexion extension views. 3.  Aortic Atherosclerosis (ICD10-I70.0)."  Please refer to chart for full details  OBJECTIVE: taken from eval unless otherwise indicated  LSVT BIG EVALUATION & EXAMINATION   Neurological and Other Medical Information:   Pt known to clinic some answers via chart (2022) What were your initial symptoms of Parkinson's disease? first diagnosed 2019   Do you have tremors?   Yes: R hand   Do you have any pain? Yes: B hip and back, refer to above    Medical Information:    Medication for Parkinson's disease: sinemet   Motor Symptoms:    When did you first start to notice changes in your movement you associate with Parkinson's disease? Recent changes following d/c from previous LSVT and HH - increasingly sedentary due to pain per spouse   What are your current symptoms? Difficulty getting out of a chair, sometimes has difficulty getting in and  out of bed, difficult with walking, still dresses herself but is mod I and sometimes must sit to dress, requires assistance with bathing, needs assistance with meal prep (due to balance and difficulty using her hands), has general difficulty with sequencing tasks/activities   What do you do when you want to move the best you possibly can? Pt does not know    Has Parkinson's disease caused you to move less or be less active? Yes: feels most recent contribution is due to pain, freezing  Have you noticed if your movement is slower than it used to be? For example, walking, getting dressed, doing household chores, bathing, etc. Yes: pt spouse reports difficulty maintaining at home exercises following discharge   Have you or others noticed any changes in your posture? Yes: pt notices she doesn't always stand up straight   How many (if any) falls have you had in the last six months? 1x     Have you noticed any freezing with your movement? Yes:     Are there some activities you now need help with because of your Parkinson's disease? See above     Assessment:     MMT: deferred due to time   Donning jacket: 17 seconds Doffing jacket: 7 seconds   Opening jar: 5.7 seconds with multiple attempts   Sit to stand assessment:  5x STS: 27 seconds    Gait assessments:  10 MWT:   0.58 m/s with rollator     Functional Component movements:   Sit to stand (required) Donning jacket (arm through sleeve) Opening jar Stepping  over and on to obstacle (simulate getting in car simulation) Big and tall posture (stand BIG)   Hierarchies Getting in and out of bed 2. Ambulating for distance and over uneven surfaces/obstacles with rollator or LRD    PATIENT SURVEYS:  FOTO 40 (goal 49) - filled out by spouse due to pt impaired cognition   PATIENT EDUCATION:  Education details: Pt educated throughout session about proper posture and technique with exercises. Improved exercise technique, movement at target joints, use of target muscles after min to mod verbal, visual, tactile cues. LSVT BIG interventions Person educated: Patient and Spouse Education method: Explanation, Demonstration, and Verbal cues Education comprehension: verbalized understanding, returned demonstration, verbal cues required, tactile cues required, and needs further education   TODAY'S TREATMENT:                                                                                                                                         DATE:    LSVT BIG Treatment:  Patient seen for LSVT Daily Session Maximal Daily Exercises for facilitation/coordination of movement Sustained movements are designed to rescale the amplitude of movement output for generalization to daily functional activities.  Maximal Adapted Daily Exercises: Floor to Ceiling 10 with 10 second hold   Side to Side 10 reps Bilateral with 10 second hold  Forward Step and Reach 14 Bilateral  Sideways Step and Reach 14 Bilateral Backwards Step and Reach 14 Bilateral Forwards Rock and Reach 14 Bilateral Sideways Rock and Reach 14 Bilateral  Maximal Daily Exercise Comments: Pt requires modeling for correct positions, VC/TC for big reach, increased amplitude, increased effort >15-20% of time, particularly with interventions 5-7 and for LUE digit abduction.Marland Kitchen Rest breaks taken throughout due to fatigue.    Functional Component movements:  5 reps performed of each Sit to stand (required)  2.  Donning jacket (arm through sleeve)  3. Opening jar  4. Stepping over and on to obstacle (simulate getting in car simulation)  5. Big and tall posture (stand/sit BIG)   Hierarchies Getting in and out of bed - instruction in rolling, sidelye>sit performed each side 1x from L to R side. Continued emphasis on rolling with large amplitude technique as pt continues to exhibit hypokinetic and bradykinetic movement. 2. Ambulating for distance and over uneven surfaces/obstacles, with CGA, with transport chair- incorporated into gait training - pt completes approx 202 ft with 2 rest breaks,stepping over 2 sets of balance pods and weaving around equipment while pushing transport chair. Continued instruction in upright posture, and increased step length, proximity to walker.  Gait -pt completes approx 202 ft with 2 rest breaks,stepping over 2 sets of balance pods and weaving around equipment while pushing transport chair. Continued instruction in upright posture, and increased step length, proximity to walker.    Carryover Assignment: Use big movement technique for STS, posture, walking, reaching/grabbing throughout today and weekend   HOME EXERCISE PROGRAM:  Pt to perform all 7 LSVT BIG  (adapted) maximal daily exercises, 5 Functional Component Tasks and big walking and carryover assignment (see above)  GOALS: Goals reviewed with patient? Yes  SHORT TERM GOALS: Target date: 10/27/2022    Patient will be independent in home exercise program to improve strength/mobility for better functional independence with ADLs. Baseline: LSVT BIG protocol to be initiated next session; 12/18: not yet fully indep, reports she still gets confused about the technique Goal status: IN PROGRESS   LONG TERM GOALS: Target date: 11/11/2022   Patient will increase FOTO score to equal to or greater than 49 to demonstrate statistically significant improvement in mobility and quality of life.  Baseline: 40; 12/18:  deferred due to time Goal status: IN PROGRESS  2.  Patient (> 44 years old) will complete five times sit to stand test in < 15 seconds indicating decreased fall risk.  Baseline: 27 sec; 12/18 23 sec hands-free Goal status: IN PROGRESS  3.  Patient will increase 10 meter walk test to >1.13ms with WFL B step-length, upright posture for >90% of test to improve ease with gait and to override hypokentic and bradykinetic movement.  Baseline: 0.58 m/s with 40VP 12/18: 0.7 m/s with 4WW Goal status: IN PROGRESS  4.  Patient will reduce time to don jacket by at least 5 seconds in order to increase ease with dressing and override bradykinetic movement Baseline: 17 seconds; 12/18: 12 seconds Goal status: MET  5.  Pt will demo or report ability to open standard jar using only 1-2 attempts in order to exhibit improved functional mobility for ADLs.  Baseline: 5.7 seconds with multiple attempts to turn jar lid to open; 12/18: 2.8 sec opens in 2 turns Goal status: MET  ASSESSMENT:  CLINICAL IMPRESSION: Pt with modest improvement/carryover today with maximal daily exercise technique. She showed improved ability to self-correct for BIG and open L hand. She continues  to have difficulty with sequencing for interventions 5-7 of maximal daily exercises, however, requiring occasional demo to initiate correct sequence. She was able to increase total volume of reps overall. The pt will benefit from further LSVT BIG PT in order to address these deficits to improve overall functional mobility, QOL and to decrease fall risk.  OBJECTIVE IMPAIRMENTS: Abnormal gait, decreased balance, decreased cognition, decreased coordination, decreased endurance, decreased knowledge of use of DME, decreased mobility, difficulty walking, decreased strength, dizziness, hypomobility, impaired UE functional use, improper body mechanics, postural dysfunction, and pain.   ACTIVITY LIMITATIONS: lifting, bending, standing, squatting, stairs,  transfers, bed mobility, bathing, dressing, hygiene/grooming, and locomotion level  PARTICIPATION LIMITATIONS: meal prep, cleaning, laundry, medication management, personal finances, driving, shopping, community activity, and yard work  PERSONAL FACTORS: Age, Fitness, Sex, Time since onset of injury/illness/exacerbation, and 3+ comorbidities: PMH includes hx of breast cancer, HTN, MCI, insomnia, cataracts, bipolar 1, alcohol use disorder, GAD, MDD, osteopenia of neck of R femur, chronic B low back pain with radiating pain to buttock and posterior thighs and calves. Pt with multiple comorbidities, please refer to chart for full details  are also affecting patient's functional outcome.   REHAB POTENTIAL: Fair    CLINICAL DECISION MAKING: Evolving/moderate complexity  EVALUATION COMPLEXITY: High  PLAN:  PT FREQUENCY: 4x/week  PT DURATION: 6 weeks  PLANNED INTERVENTIONS: Therapeutic exercises, Therapeutic activity, Neuromuscular re-education, Balance training, Gait training, Patient/Family education, Self Care, Joint mobilization, Stair training, Vestibular training, Canalith repositioning, Orthotic/Fit training, DME instructions, Wheelchair mobility training, Spinal mobilization, Cryotherapy, Moist heat, Splintting, Taping, Manual therapy, and Re-evaluation  PLAN FOR NEXT SESSION: adapted maximal daily exercises, functional component tasks, hierarchy tasks, big walking   Zollie Pee, PT 10/29/2022, 11:23 AM

## 2022-10-30 NOTE — Progress Notes (Signed)
I,Sulibeya S Dimas,acting as a Education administrator for Lavon Paganini, MD.,have documented all relevant documentation on the behalf of Lavon Paganini, MD,as directed by  Lavon Paganini, MD while in the presence of Lavon Paganini, MD.     Established patient visit   Patient: Kendra [name]   DOB: Mar 09, 1946   76 y.o. Female  MRN: [id number] Visit Date: 11/06/2022  Today's healthcare provider: Lavon Paganini, MD   Chief Complaint  Patient presents with   Hypertension   Cough   Follow-up   Subjective    HPI  Hypertension, follow-up  BP Readings from Last 3 Encounters:  11/06/22 119/72  10/29/22 (!) 149/59  09/10/22 115/68   Wt Readings from Last 3 Encounters:  11/06/22 228 lb (103.4 kg)  09/10/22 222 lb (100.7 kg)  08/28/22 223 lb (101.2 kg)     She was last seen for hypertension 6 months ago.  BP at that visit was 120/74. Management since that visit includes no changes.  She reports excellent compliance with treatment. She is not having side effects.   Outside blood pressures are not being checked  Pertinent labs Lab Results  Component Value Date   CHOL 190 12/17/2021   HDL 58 12/17/2021   LDLCALC 113 (H) 12/17/2021   TRIG 105 12/17/2021   CHOLHDL 3.3 12/17/2021   Lab Results  Component Value Date   NA 137 08/28/2022   K 3.5 08/28/2022   CREATININE 0.83 08/28/2022   GFRNONAA >60 08/28/2022   GLUCOSE 162 (H) 08/28/2022   TSH 2.31 04/13/2019     The 10-year ASCVD risk score (Arnett DK, et al., 2019) is: 18.4%  --------------------------------------------------------------------------------------------------- Follow up for cough  The patient was last seen for this 2 months ago. Changes made at last visit include chest x-ray.  She reports excellent compliance with treatment. She feels that condition is Unchanged. Husband reports persistent cough mostly in the morning and late afternoon.  She is not having side effects.   Ongoing more than 6  months.  ----------------------------------------------------------------------------------------- Follow up for chronic pain  The patient was last seen for this 6 months ago. Changes made at last visit include take acetaminophen as needed for pain. D/C tramadol.  She reports excellent compliance with treatment. She feels that condition is Unchanged. She is not having side effects.  Patients husband requesting to restart tramadol or start a better medication for pain control.  Had Lumbar epidural steroid injections with Dr Dossie Arbour in May 2023. Seemed to help functionally. Patient is unsure whether it helped the pain. Still working with PT  Currently taking '1000mg'$  1-2 times daily prn. Almost always uses in the evening. -----------------------------------------------------------------------------------------   Medications: Outpatient Medications Prior to Visit  Medication Sig   Acetaminophen 500 MG capsule Take 1,000 mg by mouth every 6 (six) hours as needed.    carbidopa-levodopa (SINEMET IR) 25-100 MG tablet Take 1 tablet by mouth daily in the afternoon. In the morning   cyanocobalamin (,VITAMIN B-12,) 1000 MCG/ML injection INJECT 1ML INTO THE MUSCLE EVERY 30 DAYS   denosumab (PROLIA) 60 MG/ML SOSY injection Inject 60 mg into the skin every 6 (six) months. takes annually   diclofenac Sodium (VOLTAREN) 1 % GEL Apply 4 g topically 4 (four) times daily as needed.   FLUoxetine HCl 60 MG TABS Take 1 tablet by mouth daily.   gabapentin (NEURONTIN) 100 MG capsule Take 3 capsules (300 mg total) by mouth 3 (three) times daily. (Patient taking differently: Take 200 mg by mouth 3 (three) times daily.)  letrozole (FEMARA) 2.5 MG tablet Take 1 tablet (2.5 mg total) by mouth daily.   lisinopril-hydrochlorothiazide (ZESTORETIC) 20-12.5 MG tablet TAKE 1 TABLET BY MOUTH DAILY   nystatin (NYAMYC) powder APPLY TOPICALLY TO AFFECTED AREA(S) THREE TIMES DAILY   risperiDONE (RISPERDAL) 0.5 MG tablet Take  1 tablet (0.5 mg total) by mouth 2 (two) times daily.   traZODone (DESYREL) 100 MG tablet TAKE ONE TABLET BY MOUTH AT BEDTIME   [DISCONTINUED] meloxicam (MOBIC) 15 MG tablet Take 1 tablet (15 mg total) by mouth daily.   [DISCONTINUED] cefpodoxime (VANTIN) 200 MG tablet Take 1 tablet (200 mg total) by mouth 2 (two) times daily. (Patient not taking: Reported on 11/06/2022)   [DISCONTINUED] polyethylene glycol (MIRALAX / GLYCOLAX) 17 g packet Take 17 g by mouth daily. (Patient not taking: Reported on 11/06/2022)   [DISCONTINUED] traMADol (ULTRAM) 50 MG tablet Take by mouth. (Patient not taking: Reported on 11/06/2022)   No facility-administered medications prior to visit.    Review of Systems  Constitutional:  Negative for appetite change.  Eyes:  Negative for visual disturbance.  Respiratory:  Negative for chest tightness and shortness of breath.   Gastrointestinal:  Negative for abdominal pain, nausea and vomiting.  Musculoskeletal:  Positive for myalgias.       Objective    BP 119/72 (BP Location: Left Arm, Cuff Size: Large)   Pulse 97   Temp 98 F (36.7 C) (Oral)   Resp 16   Wt 228 lb (103.4 kg)   SpO2 97%   BMI 36.80 kg/m  BP Readings from Last 3 Encounters:  11/06/22 119/72  10/29/22 (!) 149/59  09/10/22 115/68   Wt Readings from Last 3 Encounters:  11/06/22 228 lb (103.4 kg)  09/10/22 222 lb (100.7 kg)  08/28/22 223 lb (101.2 kg)      Physical Exam Vitals reviewed.  Constitutional:      General: She is not in acute distress.    Appearance: Normal appearance. She is well-developed. She is not diaphoretic.  HENT:     Head: Normocephalic and atraumatic.  Eyes:     General: No scleral icterus.    Conjunctiva/sclera: Conjunctivae normal.  Neck:     Thyroid: No thyromegaly.  Cardiovascular:     Rate and Rhythm: Normal rate and regular rhythm.     Heart sounds: Normal heart sounds.  Pulmonary:     Effort: Pulmonary effort is normal. No respiratory distress.      Breath sounds: Normal breath sounds. No wheezing, rhonchi or rales.  Musculoskeletal:     Cervical back: Neck supple.     Right lower leg: Edema present.     Left lower leg: Edema present.  Lymphadenopathy:     Cervical: No cervical adenopathy.  Skin:    General: Skin is warm and dry.  Neurological:     Mental Status: She is alert. Mental status is at baseline.  Psychiatric:        Mood and Affect: Mood normal.        Behavior: Behavior normal.       No results found for any visits on 11/06/22.  Assessment & Plan     Problem List Items Addressed This Visit       Cardiovascular and Mediastinum   Essential hypertension - Primary    Well controlled on recheck Continue current medications Recheck metabolic panel F/u in 6 months       Relevant Orders   Comprehensive metabolic panel     Other   Chronic  low back pain (1ry area of Pain) (Bilateral) (L>R) w/o sciatica (Chronic)    Longstanding problem She possibly had some functional benefit from [facility], but she is unable to recall if she had pain relief or not She is no longer on tramadol or NSAIDs We discussed scheduled Tylenol She is going to take it 1000 mg twice daily regularly and then have a third dose for as needed treatment midday Will continue working with PT If not improving, she may consider repeat ESI with pain management in the future      Incisional hernia, without obstruction or gangrene    Chronic and stable Has had surgical consult Discussed risks versus benefits of repair versus watchful waiting especially as it is not increasing or painful Opts for watchful waiting at this time If that changes in the future, we will refer back to general surgery      Obesity    Discussed importance of healthy weight management Discussed diet and exercise       Other hyperlipidemia    Reviewed last lipid panel Not currently on a statin Recheck FLP and CMP Discussed diet and exercise       Relevant Orders    Lipid Panel With LDL/HDL Ratio   Chronic cough    Ongoing for greater than 6 months Unclear etiology Lungs clear on exam today Recent chest x-ray was normal Never smoker No B symptoms concerning for malignancy or TB No signs of new onset CHF Discussed that the 2 most common causes of chronic cough are GERD which can be silent or allergic rhinitis Trial of H2 blocker for possible GERD      Other Visit Diagnoses     Avitaminosis D       Relevant Orders   VITAMIN D 25 Hydroxy (Vit-D Deficiency, Fractures)   Hyperglycemia       Relevant Orders   Hemoglobin A1c        Return in about 3 months (around 02/05/2023) for chronic disease f/u.      Total time spent on today's visit was greater than 45 minutes, including both face-to-face time and nonface-to-face time personally spent on review of chart (labs and imaging), discussing labs and goals, discussing further work-up, treatment options, reviewing outside records, answering patient's questions, and coordinating care.,   I, Lavon Paganini, MD, have reviewed all documentation for this visit. The documentation on 11/06/22 for the exam, diagnosis, procedures, and orders are all accurate and complete.   Demitrios Molyneux, Dionne Bucy, MD, MPH [facility]

## 2022-11-03 ENCOUNTER — Ambulatory Visit: Payer: Medicare PPO | Attending: Family Medicine

## 2022-11-03 ENCOUNTER — Ambulatory Visit: Payer: Medicare PPO | Admitting: Speech Pathology

## 2022-11-03 DIAGNOSIS — R262 Difficulty in walking, not elsewhere classified: Secondary | ICD-10-CM | POA: Insufficient documentation

## 2022-11-03 DIAGNOSIS — M25552 Pain in left hip: Secondary | ICD-10-CM | POA: Diagnosis not present

## 2022-11-03 DIAGNOSIS — R42 Dizziness and giddiness: Secondary | ICD-10-CM | POA: Insufficient documentation

## 2022-11-03 DIAGNOSIS — R278 Other lack of coordination: Secondary | ICD-10-CM | POA: Insufficient documentation

## 2022-11-03 DIAGNOSIS — R2681 Unsteadiness on feet: Secondary | ICD-10-CM | POA: Diagnosis not present

## 2022-11-03 DIAGNOSIS — R41841 Cognitive communication deficit: Secondary | ICD-10-CM | POA: Insufficient documentation

## 2022-11-03 DIAGNOSIS — M6281 Muscle weakness (generalized): Secondary | ICD-10-CM | POA: Diagnosis not present

## 2022-11-03 DIAGNOSIS — R471 Dysarthria and anarthria: Secondary | ICD-10-CM

## 2022-11-03 NOTE — Therapy (Signed)
OUTPATIENT PHYSICAL THERAPY LSVT BIG TREATMENT   Patient Name: Kendra [name] MRN: [id number] DOB:[birth year], 77 y.o., female Today's Date: 11/03/2022  PCP: Virginia Crews, MD REFERRING PROVIDER: Virginia Crews, MD  END OF SESSION:  PT End of Session - 11/03/22 1311     Visit Number 16    Number of Visits 17    Date for PT Re-Evaluation 11/10/22    PT Start Time 1106    PT Stop Time 1200    PT Time Calculation (min) 54 min    Equipment Utilized During Treatment Gait belt    Activity Tolerance Patient tolerated treatment well;Patient limited by fatigue;No increased pain;Patient limited by pain    Behavior During Therapy [facility] for tasks assessed/performed                  Past Medical History:  Diagnosis Date   Anxiety    Breast cancer ([place]) 03/2018   right breast cancer at 11:00 and 1:00   Bursitis    bilateral hips and knees   Complication of anesthesia    Depression    Family history of breast cancer 03/24/2018   Family history of lung cancer 03/24/2018   History of alcohol dependence ([place]) 2007   no ETOH; resolved since 2007   History of hiatal hernia    History of psychosis 2014   due to sleep disturbance   Hypertension    Parkinson disease    Personal history of radiation therapy 06/2018-07/2018   right breast ca   PONV (postoperative nausea and vomiting)    Past Surgical History:  Procedure Laterality Date   BREAST BIOPSY Right 03/14/2018   11:00 DCIS and invasive ductal carcinoma   BREAST BIOPSY Right 03/14/2018   1:00 Invasive ductal carcinoma   BREAST LUMPECTOMY Right 04/27/2018   lumpectomy of 11 and 1:00 cancers, clear margins, negative LN   BREAST LUMPECTOMY WITH RADIOACTIVE SEED AND SENTINEL LYMPH NODE BIOPSY Right 04/27/2018   Procedure: RIGHT BREAST LUMPECTOMY WITH RADIOACTIVE SEED X 2 AND RIGHT SENTINEL LYMPH NODE BIOPSY;  Surgeon: [name], MD;  Location: [facility];  Service: General;  Laterality: Right;    CATARACT EXTRACTION W/PHACO Left 08/30/2019   Procedure: CATARACT EXTRACTION PHACO AND INTRAOCULAR LENS PLACEMENT ([place]) LEFT panoptix toric  01:20.5  12.7%  10.26;  Surgeon: Leandrew Koyanagi, MD;  Location: [facility];  Service: Ophthalmology;  Laterality: Left;   CATARACT EXTRACTION W/PHACO Right 09/20/2019   Procedure: CATARACT EXTRACTION PHACO AND INTRAOCULAR LENS PLACEMENT ([facility]) RIGHT 5.71  00:36.4  15.7%;  Surgeon: Leandrew Koyanagi, MD;  Location: [facility];  Service: Ophthalmology;  Laterality: Right;   LAPAROTOMY N/A 02/10/2018   Procedure: EXPLORATORY LAPAROTOMY;  Surgeon: Isabel Caprice, MD;  Location: [facility];  Service: Gynecology;  Laterality: N/A;   MASS EXCISION  01/2018   abdominal   OVARIAN CYST REMOVAL     SALPINGOOPHORECTOMY Bilateral 02/10/2018   Procedure: BILATERAL SALPINGO OOPHORECTOMY; PERITONEAL WASHINGS;  Surgeon: Isabel Caprice, MD;  Location: [facility];  Service: Gynecology;  Laterality: Bilateral;   TONSILLECTOMY     TONSILLECTOMY AND ADENOIDECTOMY  1953   Patient Active Problem List   Diagnosis Date Noted   Blepharitis of upper and lower eyelids of both eyes 08/17/2022   Subacute cough 08/17/2022   History of allergy to shellfish 02/24/2022   Chronic low back pain of over 3 months duration 02/04/2022   Osteoarthritis of  AC (acromioclavicular) joint (Left) 02/04/2022   Osteoarthritis of hip (Right)  02/04/2022   Impaction fracture of distal end of radius and ulna, sequela (Left) 02/04/2022   Osteoarthritis of 1st (thumb) carpometacarpal (CMC) joint of hand (Left) 02/04/2022   Lumbosacral facet arthropathy (Multilevel) (Bilateral) (T12-S1) 02/04/2022   Lumbar central spinal stenosis w/o neurogenic claudication (L2-3, L3-4, L4-5) 02/04/2022   Lumbar foraminal stenosis (Bilateral: L2-3, L4-5) (Right: (Severe) L4-5) 02/04/2022   Lumbar lateral recess stenosis (Bilateral: L2-3, L3-4, and L4-5) (Severe) 02/04/2022   DDD (degenerative disc  disease), lumbar 02/04/2022   Lumbosacral facet syndrome (Multilevel) (Bilateral) 02/04/2022   Osteoarthritis of lumbar spine 02/04/2022   Erythema ab igne 12/23/2021   Allergic conjunctivitis of both eyes 12/23/2021   Chronic hip pain (3ry area of Pain) (Bilateral) (L>R) 12/22/2021   Chronic lower extremity pain (2ry area of Pain) (Bilateral) (L>R) 12/22/2021   Abnormal MRI, lumbar spine (01/31/2020) 12/22/2021   Grade 1 Anterolisthesis of lumbar spine (L3/L4) & (5 mm) (L4/L5) 12/22/2021   Chronic pain syndrome 11/17/2021   High risk medication use 11/17/2021   Disorder of skeletal system 11/17/2021   Problems influencing health status 11/17/2021   Other hyperlipidemia 10/21/2021   Polypharmacy 08/12/2021   Chronic low back pain (1ry area of Pain) (Bilateral) (L>R) w/o sciatica 06/18/2021   Elevated blood pressure reading 05/23/2021   Osteopenia of neck of femur (Right) 09/06/2020   Obesity 08/29/2020   Parkinson disease 02/22/2020   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   Bipolar disorder, in full remission, most recent episode depressed ([place]) 11/08/2019   Vitamin D insufficiency 08/24/2019   Insomnia due to mental condition 05/31/2019   Bipolar I disorder, most recent episode depressed ([place]) 05/31/2019   Alcohol use disorder, moderate, in sustained remission ([place]) 05/31/2019   Elevated tumor markers 05/27/2019   GAD (generalized anxiety disorder) 04/10/2019   MDD (major depressive disorder), severe ([place]) 01/31/2019   High serum carbohydrate antigen 19-9 (CA19-9) 12/01/2018   Adjustment disorder with anxious mood 11/24/2018   Essential hypertension 11/24/2018   B12 deficiency 10/17/2018   Incisional hernia, without obstruction or gangrene 09/12/2018   Genetic testing 04/07/2018   Family history of breast cancer 03/24/2018   Family history of lung cancer 03/24/2018   Malignant neoplasm of upper-outer quadrant of right breast in female, estrogen receptor positive ([place]) 03/17/2018    Malignant neoplasm of upper-inner quadrant of right breast in female, estrogen receptor positive ([place]) 03/17/2018   History of psychosis 02/07/2018   History of insomnia 02/07/2018    ONSET DATE: Pt diagnosed with PD in 2019  REFERRING DIAG:  G20 (ICD-10-CM) - Parkinson's disease      THERAPY DIAG:  Other lack of coordination  Muscle weakness (generalized)  Difficulty in walking, not elsewhere classified  Unsteadiness on feet  Pain in left hip  Rationale for Evaluation and Treatment: Rehabilitation  SUBJECTIVE: Pt reports usual L hip pain. Pt reports she did some of her HEP over the weekend.                                                                             Pain: reports continued L hip pain Pt accompanied by: significant other self  PERTINENT HISTORY:  Pt is a pleasant 77 yo female who was diagnosed with PD in 2019.  She is known to clinic and [facility], and was last seen for LSVT BIG Sept-Oct 2022. Pt with chronic pain and has become increasingly sedentary as a result exacerbating mobility difficulties due to PD. Current concerns: Pt reports L and R hip pain and back pain. Pt was being seen by pain management clinic. Pt has had injections that provided relief at the time. Pt's spouse reports her physician determined surgery was not an option for her pain so the plan is to manage it. Pt spouse thinks pain has resulted in reluctance for pt to move. Pt currently sedentary, doesn't walk much, but when she does she holds on to furniture for support due to balance problems. When pt first stands she must hold on to something due to mix of pain and/or dizziness (described as lightheadedness). Her pain can go from 3/10 to 9/10. It does improve once she starts moving. Pt has increased difficulty with multiple activities including: getting in and out of her bed, getting in and out of her car, freezing, decreased gait speed, decreased balance and cognition, and difficulty using BUE due to  tremors.  Per chart other PMH includes hx of breast cancer, HTN, MCI, insomnia, cataracts, bipolar 1, alcohol use disorder, GAD, MDD, osteopenia of neck of R femur, chronic B low back pain with radiating pain to buttock and posterior thighs and calves. Pt with multiple comorbidities, please refer to chart for full details    PAIN: from eval Are you having pain? Yes: NPRS scale: not rated currently but can reach 9/10 Pain location: L hip and L leg Pain description: pt has difficulty describing Aggravating factors: unsure Relieving factors: laying in the recliner helps, movement  PRECAUTIONS: Fall  WEIGHT BEARING RESTRICTIONS: No  FALLS: Has patient fallen in last 6 months? Yes. Number of falls 1x, leg tangled in chair leg when getting up from a chair  LIVING ENVIRONMENT: Lives with: lives with their spouse Lives in: House/apartment Stairs:  Has following equipment at home:  pt not currently using AD, holds onto walls/furniture, does have walker and SPC at home  PLOF: Independent with basic ADLs  PATIENT GOALS: improve mobility  IMAGING: via chart  07/24/22 DG BONE density " ASSESSMENT: The probability of a major osteoporotic fracture is 15.8% within the next ten years.   The probability of a hip fracture is 2.9% within the next ten years."  12/22/21 DG lumbar spine " IMPRESSION: 1. No acute displaced fracture or traumatic listhesis of the lumbar spine. 2. Interval development of grade 1 anterolisthesis of L3 on L4. Stable grade 1 anterolisthesis of L4 on L5. Appears stable on flexion extension views. 3.  Aortic Atherosclerosis (ICD10-I70.0)."  Please refer to chart for full details  OBJECTIVE: taken from eval unless otherwise indicated  LSVT BIG EVALUATION & EXAMINATION   Neurological and Other Medical Information:   Pt known to clinic some answers via chart (2022) What were your initial symptoms of Parkinson's disease? first diagnosed 2019   Do you have tremors?   Yes: R hand   Do you have any pain? Yes: B hip and back, refer to above    Medical Information:    Medication for Parkinson's disease: sinemet   Motor Symptoms:    When did you first start to notice changes in your movement you associate with Parkinson's disease? Recent changes following d/c from previous LSVT and HH - increasingly sedentary due to pain per spouse   What are your current symptoms? Difficulty getting out of a chair, sometimes  has difficulty getting in and out of bed, difficult with walking, still dresses herself but is mod I and sometimes must sit to dress, requires assistance with bathing, needs assistance with meal prep (due to balance and difficulty using her hands), has general difficulty with sequencing tasks/activities   What do you do when you want to move the best you possibly can? Pt does not know    Has Parkinson's disease caused you to move less or be less active? Yes: feels most recent contribution is due to pain, freezing  Have you noticed if your movement is slower than it used to be? For example, walking, getting dressed, doing household chores, bathing, etc. Yes: pt spouse reports difficulty maintaining at home exercises following discharge   Have you or others noticed any changes in your posture? Yes: pt notices she doesn't always stand up straight   How many (if any) falls have you had in the last six months? 1x     Have you noticed any freezing with your movement? Yes:     Are there some activities you now need help with because of your Parkinson's disease? See above     Assessment:     MMT: deferred due to time   Donning jacket: 17 seconds Doffing jacket: 7 seconds   Opening jar: 5.7 seconds with multiple attempts   Sit to stand assessment:  5x STS: 27 seconds    Gait assessments:  10 MWT:   0.58 m/s with rollator     Functional Component movements:   Sit to stand (required) Donning jacket (arm through sleeve) Opening jar Stepping  over and on to obstacle (simulate getting in car simulation) Big and tall posture (stand BIG)   Hierarchies Getting in and out of bed 2. Ambulating for distance and over uneven surfaces/obstacles with rollator or LRD    PATIENT SURVEYS:  FOTO 40 (goal 49) - filled out by spouse due to pt impaired cognition   PATIENT EDUCATION:  Education details: Pt educated throughout session about proper posture and technique with exercises. Improved exercise technique, movement at target joints, use of target muscles after min to mod verbal, visual, tactile cues. LSVT BIG interventions Person educated: Patient and Spouse Education method: Explanation, Demonstration, and Verbal cues Education comprehension: verbalized understanding, returned demonstration, verbal cues required, tactile cues required, and needs further education   TODAY'S TREATMENT:                                                                                                                                         DATE:    LSVT BIG Treatment:  Patient seen for LSVT Daily Session Maximal Daily Exercises for facilitation/coordination of movement Sustained movements are designed to rescale the amplitude of movement output for generalization to daily functional activities.  Maximal Adapted Daily Exercises: Floor to Ceiling 10 with 10 second hold   Side to Side 10 reps  Bilateral with 10 second hold Forward Step and Reach 14 Bilateral  Sideways Step and Reach 14 Bilateral Backwards Step and Reach 14 Bilateral - some difficulty with sequencing requires multi-modal cues Forwards Rock and Reach 14 Bilateral Sideways Rock and Reach 14 Bilateral  Maximal Daily Exercise Comments: Pt requires modeling for correct positions, VC/TC for big reach, increased amplitude, increased effort >20% of time, particularly with interventions 5, due to difficulty with sequencing. Rest breaks taken throughout due to fatigue.    Functional Component  movements:  5 reps performed of each Sit to stand (required)  2. Donning jacket (arm through sleeve)  3. Opening jar  4. Stepping over and on to obstacle (simulate getting in car simulation)  5. Big and tall posture (stand/sit BIG)   Hierarchies Getting in and out of bed - continued instruction in rolling, sidelye>sit performed each side 2-3x. Continued emphasis on rolling with large amplitude technique as pt continues to exhibit hypokinetic and bradykinetic movement, requires hands-on assist for this. 2. Ambulating for distance and over uneven surfaces/obstacles, with CGA, with 4WW- incorporated into gait training - pt completes approx 1x160 ft and 2x40-60 ft with 1 rest break: stepping over 1 obstacle and weaving around cones, equipment, and ambulating on and off of compliant surface. Continued instruction in upright posture, and increased step length, proximity to walker, use of brakes when performing sit<>stand.  Gait - pt completes approx 1x160 ft and 2x40-60 ft with 1 rest break: stepping over 1 obstacle and weaving around cones, equipment, and ambulating on and off of compliant surface. Continued instruction in upright posture, and increased step length, proximity to walker, use of brakes when performing sit<>stand.    Carryover Assignment: Use big movement technique generally throughout day   HOME EXERCISE PROGRAM:  Pt to perform all 7 LSVT BIG  (adapted) maximal daily exercises, 5 Functional Component Tasks and big walking and carryover assignment (see above)  GOALS: Goals reviewed with patient? Yes  SHORT TERM GOALS: Target date: 10/27/2022    Patient will be independent in home exercise program to improve strength/mobility for better functional independence with ADLs. Baseline: LSVT BIG protocol to be initiated next session; 12/18: not yet fully indep, reports she still gets confused about the technique Goal status: IN PROGRESS   LONG TERM GOALS: Target date:  11/11/2022   Patient will increase FOTO score to equal to or greater than 49 to demonstrate statistically significant improvement in mobility and quality of life.  Baseline: 40; 12/18: deferred due to time Goal status: IN PROGRESS  2.  Patient (> 26 years old) will complete five times sit to stand test in < 15 seconds indicating decreased fall risk.  Baseline: 27 sec; 12/18 23 sec hands-free Goal status: IN PROGRESS  3.  Patient will increase 10 meter walk test to >1.43ms with WFL B step-length, upright posture for >90% of test to improve ease with gait and to override hypokentic and bradykinetic movement.  Baseline: 0.58 m/s with 40FB 12/18: 0.7 m/s with 4WW Goal status: IN PROGRESS  4.  Patient will reduce time to don jacket by at least 5 seconds in order to increase ease with dressing and override bradykinetic movement Baseline: 17 seconds; 12/18: 12 seconds Goal status: MET  5.  Pt will demo or report ability to open standard jar using only 1-2 attempts in order to exhibit improved functional mobility for ADLs.  Baseline: 5.7 seconds with multiple attempts to turn jar lid to open; 12/18: 2.8 sec opens in 2 turns  Goal status: MET  ASSESSMENT:  CLINICAL IMPRESSION: Pt with some trouble today performing maximal daily exercise 5, with increased difficulty with sequencing, requiring multi-modal cues. While the pt had some difficulty with her maximal daily exercises, she did exhibit improved endurance with gait by completing a full lap (148 ft) before requiring a seated rest break. The pt also successfully ambulated around and over all obstacles. The pt will benefit from further LSVT BIG PT in order to address these deficits to improve overall functional mobility, QOL and to decrease fall risk.  OBJECTIVE IMPAIRMENTS: Abnormal gait, decreased balance, decreased cognition, decreased coordination, decreased endurance, decreased knowledge of use of DME, decreased mobility, difficulty walking,  decreased strength, dizziness, hypomobility, impaired UE functional use, improper body mechanics, postural dysfunction, and pain.   ACTIVITY LIMITATIONS: lifting, bending, standing, squatting, stairs, transfers, bed mobility, bathing, dressing, hygiene/grooming, and locomotion level  PARTICIPATION LIMITATIONS: meal prep, cleaning, laundry, medication management, personal finances, driving, shopping, community activity, and yard work  PERSONAL FACTORS: Age, Fitness, Sex, Time since onset of injury/illness/exacerbation, and 3+ comorbidities: PMH includes hx of breast cancer, HTN, MCI, insomnia, cataracts, bipolar 1, alcohol use disorder, GAD, MDD, osteopenia of neck of R femur, chronic B low back pain with radiating pain to buttock and posterior thighs and calves. Pt with multiple comorbidities, please refer to chart for full details  are also affecting patient's functional outcome.   REHAB POTENTIAL: Fair    CLINICAL DECISION MAKING: Evolving/moderate complexity  EVALUATION COMPLEXITY: High  PLAN:  PT FREQUENCY: 4x/week  PT DURATION: 6 weeks  PLANNED INTERVENTIONS: Therapeutic exercises, Therapeutic activity, Neuromuscular re-education, Balance training, Gait training, Patient/Family education, Self Care, Joint mobilization, Stair training, Vestibular training, Canalith repositioning, Orthotic/Fit training, DME instructions, Wheelchair mobility training, Spinal mobilization, Cryotherapy, Moist heat, Splintting, Taping, Manual therapy, and Re-evaluation  PLAN FOR NEXT SESSION: adapted maximal daily exercises, functional component tasks, hierarchy tasks, big walking   Zollie Pee, PT 11/03/2022, 1:13 PM

## 2022-11-03 NOTE — Therapy (Signed)
OUTPATIENT SPEECH LANGUAGE PATHOLOGY TREATMENT   Patient Name: Kendra [name] MRN: [id number] DOB:November 13, 1945, 77 y.o., female Today's Date: 11/03/2022    PCP: Lavon Paganini, MD REFERRING PROVIDER: Tally Joe, FNP   End of Session - 11/03/22 1006     Visit Number 16    Number of Visits 17    Date for SLP Re-Evaluation 11/28/22    SLP Start Time 15    SLP Stop Time  1100    SLP Time Calculation (min) 55 min    Activity Tolerance Patient tolerated treatment well             Past Medical History:  Diagnosis Date   Anxiety    Breast cancer ([place]) 03/2018   right breast cancer at 11:00 and 1:00   Bursitis    bilateral hips and knees   Complication of anesthesia    Depression    Family history of breast cancer 03/24/2018   Family history of lung cancer 03/24/2018   History of alcohol dependence ([place]) 2007   no ETOH; resolved since 2007   History of hiatal hernia    History of psychosis 2014   due to sleep disturbance   Hypertension    Parkinson disease    Personal history of radiation therapy 06/2018-07/2018   right breast ca   PONV (postoperative nausea and vomiting)    Past Surgical History:  Procedure Laterality Date   BREAST BIOPSY Right 03/14/2018   11:00 DCIS and invasive ductal carcinoma   BREAST BIOPSY Right 03/14/2018   1:00 Invasive ductal carcinoma   BREAST LUMPECTOMY Right 04/27/2018   lumpectomy of 11 and 1:00 cancers, clear margins, negative LN   BREAST LUMPECTOMY WITH RADIOACTIVE SEED AND SENTINEL LYMPH NODE BIOPSY Right 04/27/2018   Procedure: RIGHT BREAST LUMPECTOMY WITH RADIOACTIVE SEED X 2 AND RIGHT SENTINEL LYMPH NODE BIOPSY;  Surgeon: [name], MD;  Location: [place];  Service: General;  Laterality: Right;   CATARACT EXTRACTION W/PHACO Left 08/30/2019   Procedure: CATARACT EXTRACTION PHACO AND INTRAOCULAR LENS PLACEMENT ([place]) LEFT panoptix toric  01:20.5  12.7%  10.26;  Surgeon: Leandrew Koyanagi, MD;   Location: [place];  Service: Ophthalmology;  Laterality: Left;   CATARACT EXTRACTION W/PHACO Right 09/20/2019   Procedure: CATARACT EXTRACTION PHACO AND INTRAOCULAR LENS PLACEMENT ([facility]) RIGHT 5.71  00:36.4  15.7%;  Surgeon: Leandrew Koyanagi, MD;  Location: [place];  Service: Ophthalmology;  Laterality: Right;   LAPAROTOMY N/A 02/10/2018   Procedure: EXPLORATORY LAPAROTOMY;  Surgeon: Isabel Caprice, MD;  Location: [facility];  Service: Gynecology;  Laterality: N/A;   MASS EXCISION  01/2018   abdominal   OVARIAN CYST REMOVAL     SALPINGOOPHORECTOMY Bilateral 02/10/2018   Procedure: BILATERAL SALPINGO OOPHORECTOMY; PERITONEAL WASHINGS;  Surgeon: Isabel Caprice, MD;  Location: [facility];  Service: Gynecology;  Laterality: Bilateral;   TONSILLECTOMY     TONSILLECTOMY AND ADENOIDECTOMY  1953   Patient Active Problem List   Diagnosis Date Noted   Blepharitis of upper and lower eyelids of both eyes 08/17/2022   Subacute cough 08/17/2022   History of allergy to shellfish 02/24/2022   Chronic low back pain of over 3 months duration 02/04/2022   Osteoarthritis of  AC (acromioclavicular) joint (Left) 02/04/2022   Osteoarthritis of hip (Right) 02/04/2022   Impaction fracture of distal end of radius and ulna, sequela (Left) 02/04/2022   Osteoarthritis of 1st (thumb) carpometacarpal (CMC) joint of hand (Left) 02/04/2022   Lumbosacral facet arthropathy (Multilevel) (Bilateral) (T12-S1)  02/04/2022   Lumbar central spinal stenosis w/o neurogenic claudication (L2-3, L3-4, L4-5) 02/04/2022   Lumbar foraminal stenosis (Bilateral: L2-3, L4-5) (Right: (Severe) L4-5) 02/04/2022   Lumbar lateral recess stenosis (Bilateral: L2-3, L3-4, and L4-5) (Severe) 02/04/2022   DDD (degenerative disc disease), lumbar 02/04/2022   Lumbosacral facet syndrome (Multilevel) (Bilateral) 02/04/2022   Osteoarthritis of lumbar spine 02/04/2022   Erythema ab igne 12/23/2021   Allergic conjunctivitis of both  eyes 12/23/2021   Chronic hip pain (3ry area of Pain) (Bilateral) (L>R) 12/22/2021   Chronic lower extremity pain (2ry area of Pain) (Bilateral) (L>R) 12/22/2021   Abnormal MRI, lumbar spine (01/31/2020) 12/22/2021   Grade 1 Anterolisthesis of lumbar spine (L3/L4) & (5 mm) (L4/L5) 12/22/2021   Chronic pain syndrome 11/17/2021   High risk medication use 11/17/2021   Disorder of skeletal system 11/17/2021   Problems influencing health status 11/17/2021   Other hyperlipidemia 10/21/2021   Polypharmacy 08/12/2021   Chronic low back pain (1ry area of Pain) (Bilateral) (L>R) w/o sciatica 06/18/2021   Elevated blood pressure reading 05/23/2021   Osteopenia of neck of femur (Right) 09/06/2020   Obesity 08/29/2020   Parkinson disease 02/22/2020   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   Bipolar disorder, in full remission, most recent episode depressed ([place]) 11/08/2019   Vitamin D insufficiency 08/24/2019   Insomnia due to mental condition 05/31/2019   Bipolar I disorder, most recent episode depressed ([place]) 05/31/2019   Alcohol use disorder, moderate, in sustained remission ([place]) 05/31/2019   Elevated tumor markers 05/27/2019   GAD (generalized anxiety disorder) 04/10/2019   MDD (major depressive disorder), severe ([place]) 01/31/2019   High serum carbohydrate antigen 19-9 (CA19-9) 12/01/2018   Adjustment disorder with anxious mood 11/24/2018   Essential hypertension 11/24/2018   B12 deficiency 10/17/2018   Incisional hernia, without obstruction or gangrene 09/12/2018   Genetic testing 04/07/2018   Family history of breast cancer 03/24/2018   Family history of lung cancer 03/24/2018   Malignant neoplasm of upper-outer quadrant of right breast in female, estrogen receptor positive ([place]) 03/17/2018   Malignant neoplasm of upper-inner quadrant of right breast in female, estrogen receptor positive ([place]) 03/17/2018   History of psychosis 02/07/2018   History of insomnia 02/07/2018    Onset date: PD dx  2021  REFERRING DIAG: Parkinson's disease  THERAPY DIAG:  Dysarthria and anarthria  Cognitive communication deficit  Rationale for Evaluation and Treatment Rehabilitation  SUBJECTIVE:   SUBJECTIVE STATEMENT:  "My mind just goes blank."  Pt accompanied by:  spouse  PERTINENT HISTORY: Patient is a 77 y.o. female with history of Parkinson's disease (dx 2021), anxiety, depression, psychosis, HTN, bipolar disorder, alcoholism, memory issues since 2005, hx head trauma. Referred for LSVT BIG and LOUD; known to our clinic from prior course of therapy completed in 2022.  DIAGNOSTIC FINDINGS: MRI Brain 08/08/2018: "1. No acute intracranial abnormality and largely unremarkable for age gray and white matter signal. 2. NeuroQuant volumetric analysis of the brain suggests age advanced brain volume loss, see details on [facility] PACS."  PAIN:  Are you having pain? Yes: NPRS scale: 7/10 Pain location: left hips   PATIENT GOALS communicate better  OBJECTIVE:   TODAY'S TREATMENT: Spouse present for session today. Modeled using written keywords and communication strategies to aid pt processing and participation during discussion. Worked collaboratively with pt and spouse to generate strategies for supportive routines, ideas for visual aids/structure for schedule and choicemaking, and cuing strategies for communication breakdowns. Plan to address compensations for memory and attention next session.  PATIENT EDUCATION: Education details: cuing strategies for supporting choicemaking, cuing pt during communication breakdown, strategies to aid pt processing Person educated: Patient and Spouse Education method: Explanation and Verbal cues Education comprehension: verbalized understanding     GOALS: Goals reviewed with patient? Yes  SHORT TERM GOALS: Target date: 10 sessions  The patient will complete Daily Tasks (Maximum duration "ah", High/Lows, and Functional Phrases) at average loudness >/=  85 dB and with loud, good quality voice with min cues.  Baseline: 10/19/22: 81 dB Goal status: NOT MET  2.  The patient will complete Hierarchal Speech Loudness reading drills (words/phrases, sentences) at average >/= 75 dB and with loud, good quality voice with min cues. Baseline: 10/19/22: 72 dB Goal status: NOT MET  3.  The patient will participate in 5-8 minutes conversation, maintaining average loudness of 75 dB and loud, good quality voice with min cues. Baseline: 10/19/22 72 dB Goal status: NOT MET   LONG TERM GOALS: Target date:   The patient will complete Daily Tasks (Maximum duration "ah", High/Lows, and Functional Phrases) at average loudness >/= 82 dB and with loud, good quality voice Baseline:  Goal status: DEFERRED  2.  The patient will complete Hierarchal Speech Loudness reading drills (words/phrases, sentences, and paragraph) at average >/= 72 dB and with loud, good quality voice. Baseline:  Goal status: DEFERRED  3.  The patient will participate in 10 minutes conversation, maintaining average loudness of >/= 72 dB and loud, good quality voice. Baseline:  Goal status: DEFERRED  4.  Patient will report improved communication effectiveness as measured by Communicative Effectiveness Survey.  Baseline: 18/32 on 09/29/22 Goal status: IN PROGRESS  5. Patient/spouse will demonstrate understanding of strategies to increase pt's communication effectiveness at home (supported conversation, chunking information, written choices, etc).  Baseline:  Goal status: INITIAL   ASSESSMENT:  CLINICAL IMPRESSION: Patient is a 77 y.o. female who presents with mild-moderate dysarthria characterized by reduced vocal intensity, reduced pitch variability, hoarse vocal quality, and subjectively low pitch for gender. Due to plateau with loudness and vocal quality plateaued last week, and due to increased need for cuing during LSVT tasks, focus was shifted last week to focus on communication  and cognitive strategies. Anticipate 1-2 more sessions necessary to complete caregiver training and education.   OBJECTIVE IMPAIRMENTS include attention, memory, awareness, expressive language, receptive language, dysarthria, and voice disorder. These impairments are limiting patient from effectively communicating at home and in community. Factors affecting potential to achieve goals and functional outcome are ability to learn/carryover information and previous level of function.. Patient will benefit from skilled SLP services to address above impairments and improve overall function.  REHAB POTENTIAL: Good  PLAN: SLP FREQUENCY: 4x/week  SLP DURATION: 4 weeks  PLANNED INTERVENTIONS: Cueing hierachy, Functional tasks, SLP instruction and feedback, Compensatory strategies, Patient/family education, and LSVT-LOUD      Deneise Lever, MS, Actor [phone]

## 2022-11-04 ENCOUNTER — Ambulatory Visit: Payer: Medicare PPO | Admitting: Speech Pathology

## 2022-11-04 ENCOUNTER — Ambulatory Visit: Payer: Medicare PPO

## 2022-11-04 DIAGNOSIS — R2681 Unsteadiness on feet: Secondary | ICD-10-CM | POA: Diagnosis not present

## 2022-11-04 DIAGNOSIS — M6281 Muscle weakness (generalized): Secondary | ICD-10-CM

## 2022-11-04 DIAGNOSIS — R41841 Cognitive communication deficit: Secondary | ICD-10-CM

## 2022-11-04 DIAGNOSIS — R278 Other lack of coordination: Secondary | ICD-10-CM

## 2022-11-04 DIAGNOSIS — R471 Dysarthria and anarthria: Secondary | ICD-10-CM

## 2022-11-04 DIAGNOSIS — M25552 Pain in left hip: Secondary | ICD-10-CM

## 2022-11-04 DIAGNOSIS — R262 Difficulty in walking, not elsewhere classified: Secondary | ICD-10-CM

## 2022-11-04 DIAGNOSIS — R42 Dizziness and giddiness: Secondary | ICD-10-CM

## 2022-11-04 NOTE — Therapy (Signed)
OUTPATIENT PHYSICAL THERAPY LSVT BIG TREATMENT/RECERT   Patient Name: Kendra [name] MRN: [id number] DOB:[birth year], 77 y.o., female Today's Date: 11/04/2022  PCP: Virginia Crews, MD REFERRING PROVIDER: Virginia Crews, MD  END OF SESSION:  PT End of Session - 11/04/22 1303     Visit Number 17    Number of Visits 33    Date for PT Re-Evaluation 12/30/22    PT Start Time 1001    PT Stop Time 1100    PT Time Calculation (min) 59 min    Equipment Utilized During Treatment Gait belt    Activity Tolerance Patient tolerated treatment well;Patient limited by fatigue;No increased pain    Behavior During Therapy [facility] for tasks assessed/performed                   Past Medical History:  Diagnosis Date   Anxiety    Breast cancer ([place]) 03/2018   right breast cancer at 11:00 and 1:00   Bursitis    bilateral hips and knees   Complication of anesthesia    Depression    Family history of breast cancer 03/24/2018   Family history of lung cancer 03/24/2018   History of alcohol dependence ([place]) 2007   no ETOH; resolved since 2007   History of hiatal hernia    History of psychosis 2014   due to sleep disturbance   Hypertension    Parkinson disease    Personal history of radiation therapy 06/2018-07/2018   right breast ca   PONV (postoperative nausea and vomiting)    Past Surgical History:  Procedure Laterality Date   BREAST BIOPSY Right 03/14/2018   11:00 DCIS and invasive ductal carcinoma   BREAST BIOPSY Right 03/14/2018   1:00 Invasive ductal carcinoma   BREAST LUMPECTOMY Right 04/27/2018   lumpectomy of 11 and 1:00 cancers, clear margins, negative LN   BREAST LUMPECTOMY WITH RADIOACTIVE SEED AND SENTINEL LYMPH NODE BIOPSY Right 04/27/2018   Procedure: RIGHT BREAST LUMPECTOMY WITH RADIOACTIVE SEED X 2 AND RIGHT SENTINEL LYMPH NODE BIOPSY;  Surgeon: [name], MD;  Location: [place];  Service: General;  Laterality: Right;   CATARACT  EXTRACTION W/PHACO Left 08/30/2019   Procedure: CATARACT EXTRACTION PHACO AND INTRAOCULAR LENS PLACEMENT ([place]) LEFT panoptix toric  01:20.5  12.7%  10.26;  Surgeon: Leandrew Koyanagi, MD;  Location: [place];  Service: Ophthalmology;  Laterality: Left;   CATARACT EXTRACTION W/PHACO Right 09/20/2019   Procedure: CATARACT EXTRACTION PHACO AND INTRAOCULAR LENS PLACEMENT ([facility]) RIGHT 5.71  00:36.4  15.7%;  Surgeon: Leandrew Koyanagi, MD;  Location: [place];  Service: Ophthalmology;  Laterality: Right;   LAPAROTOMY N/A 02/10/2018   Procedure: EXPLORATORY LAPAROTOMY;  Surgeon: Isabel Caprice, MD;  Location: [facility];  Service: Gynecology;  Laterality: N/A;   MASS EXCISION  01/2018   abdominal   OVARIAN CYST REMOVAL     SALPINGOOPHORECTOMY Bilateral 02/10/2018   Procedure: BILATERAL SALPINGO OOPHORECTOMY; PERITONEAL WASHINGS;  Surgeon: Isabel Caprice, MD;  Location: [facility];  Service: Gynecology;  Laterality: Bilateral;   TONSILLECTOMY     TONSILLECTOMY AND ADENOIDECTOMY  1953   Patient Active Problem List   Diagnosis Date Noted   Blepharitis of upper and lower eyelids of both eyes 08/17/2022   Subacute cough 08/17/2022   History of allergy to shellfish 02/24/2022   Chronic low back pain of over 3 months duration 02/04/2022   Osteoarthritis of  AC (acromioclavicular) joint (Left) 02/04/2022   Osteoarthritis of hip (Right) 02/04/2022  Impaction fracture of distal end of radius and ulna, sequela (Left) 02/04/2022   Osteoarthritis of 1st (thumb) carpometacarpal (CMC) joint of hand (Left) 02/04/2022   Lumbosacral facet arthropathy (Multilevel) (Bilateral) (T12-S1) 02/04/2022   Lumbar central spinal stenosis w/o neurogenic claudication (L2-3, L3-4, L4-5) 02/04/2022   Lumbar foraminal stenosis (Bilateral: L2-3, L4-5) (Right: (Severe) L4-5) 02/04/2022   Lumbar lateral recess stenosis (Bilateral: L2-3, L3-4, and L4-5) (Severe) 02/04/2022   DDD (degenerative disc disease),  lumbar 02/04/2022   Lumbosacral facet syndrome (Multilevel) (Bilateral) 02/04/2022   Osteoarthritis of lumbar spine 02/04/2022   Erythema ab igne 12/23/2021   Allergic conjunctivitis of both eyes 12/23/2021   Chronic hip pain (3ry area of Pain) (Bilateral) (L>R) 12/22/2021   Chronic lower extremity pain (2ry area of Pain) (Bilateral) (L>R) 12/22/2021   Abnormal MRI, lumbar spine (01/31/2020) 12/22/2021   Grade 1 Anterolisthesis of lumbar spine (L3/L4) & (5 mm) (L4/L5) 12/22/2021   Chronic pain syndrome 11/17/2021   High risk medication use 11/17/2021   Disorder of skeletal system 11/17/2021   Problems influencing health status 11/17/2021   Other hyperlipidemia 10/21/2021   Polypharmacy 08/12/2021   Chronic low back pain (1ry area of Pain) (Bilateral) (L>R) w/o sciatica 06/18/2021   Elevated blood pressure reading 05/23/2021   Osteopenia of neck of femur (Right) 09/06/2020   Obesity 08/29/2020   Parkinson disease 02/22/2020   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   Bipolar disorder, in full remission, most recent episode depressed ([place]) 11/08/2019   Vitamin D insufficiency 08/24/2019   Insomnia due to mental condition 05/31/2019   Bipolar I disorder, most recent episode depressed ([place]) 05/31/2019   Alcohol use disorder, moderate, in sustained remission ([place]) 05/31/2019   Elevated tumor markers 05/27/2019   GAD (generalized anxiety disorder) 04/10/2019   MDD (major depressive disorder), severe ([place]) 01/31/2019   High serum carbohydrate antigen 19-9 (CA19-9) 12/01/2018   Adjustment disorder with anxious mood 11/24/2018   Essential hypertension 11/24/2018   B12 deficiency 10/17/2018   Incisional hernia, without obstruction or gangrene 09/12/2018   Genetic testing 04/07/2018   Family history of breast cancer 03/24/2018   Family history of lung cancer 03/24/2018   Malignant neoplasm of upper-outer quadrant of right breast in female, estrogen receptor positive ([place]) 03/17/2018   Malignant  neoplasm of upper-inner quadrant of right breast in female, estrogen receptor positive ([place]) 03/17/2018   History of psychosis 02/07/2018   History of insomnia 02/07/2018    ONSET DATE: Pt diagnosed with PD in 2019  REFERRING DIAG:  G20 (ICD-10-CM) - Parkinson's disease      THERAPY DIAG:  Muscle weakness (generalized)  Unsteadiness on feet  Other lack of coordination  Difficulty in walking, not elsewhere classified  Dizziness and giddiness  Pain in left hip  Rationale for Evaluation and Treatment: Rehabilitation  SUBJECTIVE: Pt reports usual L hip pain. Pt reports she feels really tired. She reports she slept too much and is having an off day.                                                                             Pain: reports continued L hip pain Pt accompanied by: significant other self  PERTINENT HISTORY:  Pt is a pleasant 77 yo  female who was diagnosed with PD in 2019. She is known to clinic and [facility], and was last seen for LSVT BIG Sept-Oct 2022. Pt with chronic pain and has become increasingly sedentary as a result exacerbating mobility difficulties due to PD. Current concerns: Pt reports L and R hip pain and back pain. Pt was being seen by pain management clinic. Pt has had injections that provided relief at the time. Pt's spouse reports her physician determined surgery was not an option for her pain so the plan is to manage it. Pt spouse thinks pain has resulted in reluctance for pt to move. Pt currently sedentary, doesn't walk much, but when she does she holds on to furniture for support due to balance problems. When pt first stands she must hold on to something due to mix of pain and/or dizziness (described as lightheadedness). Her pain can go from 3/10 to 9/10. It does improve once she starts moving. Pt has increased difficulty with multiple activities including: getting in and out of her bed, getting in and out of her car, freezing, decreased gait speed,  decreased balance and cognition, and difficulty using BUE due to tremors.  Per chart other PMH includes hx of breast cancer, HTN, MCI, insomnia, cataracts, bipolar 1, alcohol use disorder, GAD, MDD, osteopenia of neck of R femur, chronic B low back pain with radiating pain to buttock and posterior thighs and calves. Pt with multiple comorbidities, please refer to chart for full details    PAIN: from eval Are you having pain? Yes: NPRS scale: not rated currently but can reach 9/10 Pain location: L hip and L leg Pain description: pt has difficulty describing Aggravating factors: unsure Relieving factors: laying in the recliner helps, movement  PRECAUTIONS: Fall  WEIGHT BEARING RESTRICTIONS: No  FALLS: Has patient fallen in last 6 months? Yes. Number of falls 1x, leg tangled in chair leg when getting up from a chair  LIVING ENVIRONMENT: Lives with: lives with their spouse Lives in: House/apartment Stairs:  Has following equipment at home:  pt not currently using AD, holds onto walls/furniture, does have walker and SPC at home  PLOF: Independent with basic ADLs  PATIENT GOALS: improve mobility  IMAGING: via chart  07/24/22 DG BONE density " ASSESSMENT: The probability of a major osteoporotic fracture is 15.8% within the next ten years.   The probability of a hip fracture is 2.9% within the next ten years."  12/22/21 DG lumbar spine " IMPRESSION: 1. No acute displaced fracture or traumatic listhesis of the lumbar spine. 2. Interval development of grade 1 anterolisthesis of L3 on L4. Stable grade 1 anterolisthesis of L4 on L5. Appears stable on flexion extension views. 3.  Aortic Atherosclerosis (ICD10-I70.0)."  Please refer to chart for full details  OBJECTIVE: taken from eval unless otherwise indicated  LSVT BIG EVALUATION & EXAMINATION   Neurological and Other Medical Information:   Pt known to clinic some answers via chart (2022) What were your initial symptoms of  Parkinson's disease? first diagnosed 2019   Do you have tremors?  Yes: R hand   Do you have any pain? Yes: B hip and back, refer to above    Medical Information:    Medication for Parkinson's disease: sinemet   Motor Symptoms:    When did you first start to notice changes in your movement you associate with Parkinson's disease? Recent changes following d/c from previous LSVT and HH - increasingly sedentary due to pain per spouse   What are your current  symptoms? Difficulty getting out of a chair, sometimes has difficulty getting in and out of bed, difficult with walking, still dresses herself but is mod I and sometimes must sit to dress, requires assistance with bathing, needs assistance with meal prep (due to balance and difficulty using her hands), has general difficulty with sequencing tasks/activities   What do you do when you want to move the best you possibly can? Pt does not know    Has Parkinson's disease caused you to move less or be less active? Yes: feels most recent contribution is due to pain, freezing  Have you noticed if your movement is slower than it used to be? For example, walking, getting dressed, doing household chores, bathing, etc. Yes: pt spouse reports difficulty maintaining at home exercises following discharge   Have you or others noticed any changes in your posture? Yes: pt notices she doesn't always stand up straight   How many (if any) falls have you had in the last six months? 1x     Have you noticed any freezing with your movement? Yes:     Are there some activities you now need help with because of your Parkinson's disease? See above     Assessment:     MMT: deferred due to time   Donning jacket: 17 seconds Doffing jacket: 7 seconds   Opening jar: 5.7 seconds with multiple attempts   Sit to stand assessment:  5x STS: 27 seconds    Gait assessments:  10 MWT:   0.58 m/s with rollator     Functional Component movements:   Sit to stand  (required) Donning jacket (arm through sleeve) Opening jar Stepping over and on to obstacle (simulate getting in car simulation) Big and tall posture (stand BIG)   Hierarchies Getting in and out of bed 2. Ambulating for distance and over uneven surfaces/obstacles with rollator or LRD    PATIENT SURVEYS:  FOTO 40 (goal 49) - filled out by spouse due to pt impaired cognition   PATIENT EDUCATION:  Education details: Pt educated throughout session about proper posture and technique with exercises. Improved exercise technique, movement at target joints, use of target muscles after min to mod verbal, visual, tactile cues. LSVT BIG interventions Person educated: Patient and Spouse Education method: Explanation, Demonstration, and Verbal cues Education comprehension: verbalized understanding, returned demonstration, verbal cues required, tactile cues required, and needs further education   TODAY'S TREATMENT:                                                                                                                                         DATE:    Goal testing completed on this date, please refer to goal section below.   PT provides education to pt and spouse on use of 4WW for mobility and safety. Encourages pt to use a 4WW outside of PT. PT also discusses plan moving forward beyond LSVT  BIG and advises that pt would benefit from continuing with regular PT. Pt and spouse verbalize understanding.  LSVT BIG Treatment:   Patient seen for LSVT Daily Session Maximal Daily Exercises for facilitation/coordination of movement Sustained movements are designed to rescale the amplitude of movement output for generalization to daily functional activities.  Maximal Adapted Daily Exercises: Floor to Ceiling 8 with 10 second hold   Side to Side 8 reps Bilateral with 10 second hold Forward Step and Reach 10 Bilateral  Sideways Step and Reach 10 Bilateral Backwards Step and Reach 10 Bilateral   Forwards Rock and Reach 10 Bilateral Sideways Rock and Reach 14 Bilateral  Maximal Daily Exercise Comments: Pt requires modeling for correct positions, VC/TC for big reach, increased amplitude, increased effort >20% of time. Decreased reps due to time limitations following goal retesting, discussion of plan and AD.    Functional Component movements:  5 reps performed of each Sit to stand (required)  2. Donning jacket (arm through sleeve)  3. Opening jar  4. Stepping over and on to obstacle (simulate getting in car simulation)  5. Big and tall posture (stand/sit BIG)   Hierarchies Getting in and out of bed - continued instruction in rolling, sidelye>sit performed each side 1x. Continued emphasis on rolling with large amplitude technique as pt continues to exhibit hypokinetic and bradykinetic movement, pt continues to require hands-on assist for correct technique. 2. Ambulating for distance and over uneven surfaces/obstacles, with CGA, with 4WW- incorporated into gait training and goal reassessment: completion of 10MWT, and additional 80 ft using first 4WW then transport chair.  Gait -incorporated into gait training and goal reassessment: completion of 10MWT, and additional 80 ft using first 4WW then transport chair. Continued cues for proximity to walker/transport chair, use of brakes, upright posture.    Carryover Assignment: Continue use of big movement technique generally throughout day   HOME EXERCISE PROGRAM:  Pt to perform all 7 LSVT BIG  (adapted) maximal daily exercises, 5 Functional Component Tasks and big walking and carryover assignment (see above)  GOALS: Goals reviewed with patient? Yes  SHORT TERM GOALS: Target date: 10/27/2022    Patient will be independent in home exercise program to improve strength/mobility for better functional independence with ADLs. Baseline: LSVT BIG protocol to be initiated next session; 12/18: not yet fully indep, reports she still gets  confused about the technique Goal status: IN PROGRESS   LONG TERM GOALS: Target date: 11/11/2022   Patient will increase FOTO score to equal to or greater than 49 to demonstrate statistically significant improvement in mobility and quality of life.  Baseline: 40; 12/18: deferred due to time; 45  Goal status: PARTIALLY MET  2.  Patient (> 39 years old) will complete five times sit to stand test in < 15 seconds indicating decreased fall risk.  Baseline: 27 sec; 12/18 23 sec hands-free; 11/04/22: 24 sec hands free Goal status: IN PROGRESS  3.  Patient will increase 10 meter walk test to >1.4ms with WFL B step-length, upright posture for >90% of test to improve ease with gait and to override hypokentic and bradykinetic movement.  Baseline: 0.58 m/s with 47DU 12/18: 0.7 m/s with 42GU 11/04/22: 0.69 m/s with 4WW Goal status: IN PROGRESS  4.  Patient will reduce time to don jacket by at least 5 seconds in order to increase ease with dressing and override bradykinetic movement Baseline: 17 seconds; 12/18: 12 seconds Goal status: MET  5.  Pt will demo or report ability to open standard  jar using only 1-2 attempts in order to exhibit improved functional mobility for ADLs.  Baseline: 5.7 seconds with multiple attempts to turn jar lid to open; 12/18: 2.8 sec opens in 2 turns Goal status: MET  6.  Patient will increase Berg Balance score by > 6 points to demonstrate decreased fall risk during functional activities. Baseline: to be tested next 1-2 visits Goal status: NEW  7.  Patient will report at least 4 days in a row with minimal to no L hip pain in order to improve QOL and ease/safety with functional mobility. Baseline: Pt reports pain majority of days in L hip Goal status: NEW  ASSESSMENT:  CLINICAL IMPRESSION: Goals reassessed for completion of LSVT BIG treatment. While pt has made progress over course of treatment, she exhibits similar performance to previous assessment with 10MWT and  5xSTS. Pt did show improvement on FOTO, indicating increased perception of functional mobility and QOL. Limited progress likely due to recent assessment completed 10/19/2022.  PT, pt and her spouse discussed continuing physical therapy beyond LSVT BIG with reduction in frequency to 2x/week to focus on  pain, balance, gait/endurance and dizziness with movement. Further assessment of these impairments to be completed next couple visits, as they are currently contributing to increased fall risk. Pt and spouse agreeable to plan. The pt will benefit from further skilled physical therapy to address these deficits to improve overall functional mobility, QOL and to decrease fall risk.  OBJECTIVE IMPAIRMENTS: Abnormal gait, decreased balance, decreased cognition, decreased coordination, decreased endurance, decreased knowledge of use of DME, decreased mobility, difficulty walking, decreased strength, dizziness, hypomobility, impaired UE functional use, improper body mechanics, postural dysfunction, and pain.   ACTIVITY LIMITATIONS: lifting, bending, standing, squatting, stairs, transfers, bed mobility, bathing, dressing, hygiene/grooming, and locomotion level  PARTICIPATION LIMITATIONS: meal prep, cleaning, laundry, medication management, personal finances, driving, shopping, community activity, and yard work  PERSONAL FACTORS: Age, Fitness, Sex, Time since onset of injury/illness/exacerbation, and 3+ comorbidities: PMH includes hx of breast cancer, HTN, MCI, insomnia, cataracts, bipolar 1, alcohol use disorder, GAD, MDD, osteopenia of neck of R femur, chronic B low back pain with radiating pain to buttock and posterior thighs and calves. Pt with multiple comorbidities, please refer to chart for full details  are also affecting patient's functional outcome.   REHAB POTENTIAL: Fair    CLINICAL DECISION MAKING: Evolving/moderate complexity  EVALUATION COMPLEXITY: High  PLAN:  PT FREQUENCY: 2x/week  PT  DURATION: 8 weeks  PLANNED INTERVENTIONS: Therapeutic exercises, Therapeutic activity, Neuromuscular re-education, Balance training, Gait training, Patient/Family education, Self Care, Joint mobilization, Stair training, Vestibular training, Canalith repositioning, Visual/preceptual remediation/compensation, Orthotic/Fit training, DME instructions, Wheelchair mobility training, Spinal mobilization, Cryotherapy, Moist heat, Splintting, Taping, and Manual therapy  PLAN FOR NEXT SESSION: vestibular screen/assessment, hip assessment due to ongoing pain limiting mobility, endurance, gait and balance   Zollie Pee, PT 11/04/2022, 3:06 PM

## 2022-11-04 NOTE — Therapy (Signed)
OUTPATIENT SPEECH LANGUAGE PATHOLOGY TREATMENT AND DISCHARGE SUMMARY   Patient Name: Kendra [name] MRN: [id number] DOB:December 28, 1945, 77 y.o., female Today's Date: 11/04/2022  SPEECH THERAPY DISCHARGE SUMMARY  Visits from Start of Care: 17  Current functional level related to goals / functional outcomes: LSVT goals not met despite downgrade; severity of cognitive impairment as well as fatigue and motivation contributed to this. Goals were revised to focus on communication strategies and LSVT was discontinued during 3rd week of treatment.    Remaining deficits: Mild-moderate dysarthria and chronic cognitive communication deficits persist.    Education / Equipment: Memory book, schedule, routines reviewed with spouse and pt   Patient agrees to discharge. Patient goals were partially met. Patient is being discharged due to maximized rehab potential. .      PCP: Lavon Paganini, MD REFERRING PROVIDER: Tally Joe, FNP   End of Session - 11/04/22 1107     Visit Number 17    Number of Visits 17    Date for SLP Re-Evaluation 11/28/22    SLP Start Time 1105    SLP Stop Time  1200    SLP Time Calculation (min) 55 min    Activity Tolerance Patient tolerated treatment well             Past Medical History:  Diagnosis Date   Anxiety    Breast cancer ([place]) 03/2018   right breast cancer at 11:00 and 1:00   Bursitis    bilateral hips and knees   Complication of anesthesia    Depression    Family history of breast cancer 03/24/2018   Family history of lung cancer 03/24/2018   History of alcohol dependence ([place]) 2007   no ETOH; resolved since 2007   History of hiatal hernia    History of psychosis 2014   due to sleep disturbance   Hypertension    Parkinson disease    Personal history of radiation therapy 06/2018-07/2018   right breast ca   PONV (postoperative nausea and vomiting)    Past Surgical History:  Procedure Laterality Date   BREAST BIOPSY Right 03/14/2018    11:00 DCIS and invasive ductal carcinoma   BREAST BIOPSY Right 03/14/2018   1:00 Invasive ductal carcinoma   BREAST LUMPECTOMY Right 04/27/2018   lumpectomy of 11 and 1:00 cancers, clear margins, negative LN   BREAST LUMPECTOMY WITH RADIOACTIVE SEED AND SENTINEL LYMPH NODE BIOPSY Right 04/27/2018   Procedure: RIGHT BREAST LUMPECTOMY WITH RADIOACTIVE SEED X 2 AND RIGHT SENTINEL LYMPH NODE BIOPSY;  Surgeon: [name], MD;  Location: [facility];  Service: General;  Laterality: Right;   CATARACT EXTRACTION W/PHACO Left 08/30/2019   Procedure: CATARACT EXTRACTION PHACO AND INTRAOCULAR LENS PLACEMENT ([place]) LEFT panoptix toric  01:20.5  12.7%  10.26;  Surgeon: Leandrew Koyanagi, MD;  Location: [facility];  Service: Ophthalmology;  Laterality: Left;   CATARACT EXTRACTION W/PHACO Right 09/20/2019   Procedure: CATARACT EXTRACTION PHACO AND INTRAOCULAR LENS PLACEMENT ([facility]) RIGHT 5.71  00:36.4  15.7%;  Surgeon: Leandrew Koyanagi, MD;  Location: [facility];  Service: Ophthalmology;  Laterality: Right;   LAPAROTOMY N/A 02/10/2018   Procedure: EXPLORATORY LAPAROTOMY;  Surgeon: Isabel Caprice, MD;  Location: [facility];  Service: Gynecology;  Laterality: N/A;   MASS EXCISION  01/2018   abdominal   OVARIAN CYST REMOVAL     SALPINGOOPHORECTOMY Bilateral 02/10/2018   Procedure: BILATERAL SALPINGO OOPHORECTOMY; PERITONEAL WASHINGS;  Surgeon: Isabel Caprice, MD;  Location: [facility];  Service: Gynecology;  Laterality: Bilateral;  TONSILLECTOMY     TONSILLECTOMY AND ADENOIDECTOMY  1953   Patient Active Problem List   Diagnosis Date Noted   Blepharitis of upper and lower eyelids of both eyes 08/17/2022   Subacute cough 08/17/2022   History of allergy to shellfish 02/24/2022   Chronic low back pain of over 3 months duration 02/04/2022   Osteoarthritis of  AC (acromioclavicular) joint (Left) 02/04/2022   Osteoarthritis of hip (Right) 02/04/2022   Impaction fracture of  distal end of radius and ulna, sequela (Left) 02/04/2022   Osteoarthritis of 1st (thumb) carpometacarpal (CMC) joint of hand (Left) 02/04/2022   Lumbosacral facet arthropathy (Multilevel) (Bilateral) (T12-S1) 02/04/2022   Lumbar central spinal stenosis w/o neurogenic claudication (L2-3, L3-4, L4-5) 02/04/2022   Lumbar foraminal stenosis (Bilateral: L2-3, L4-5) (Right: (Severe) L4-5) 02/04/2022   Lumbar lateral recess stenosis (Bilateral: L2-3, L3-4, and L4-5) (Severe) 02/04/2022   DDD (degenerative disc disease), lumbar 02/04/2022   Lumbosacral facet syndrome (Multilevel) (Bilateral) 02/04/2022   Osteoarthritis of lumbar spine 02/04/2022   Erythema ab igne 12/23/2021   Allergic conjunctivitis of both eyes 12/23/2021   Chronic hip pain (3ry area of Pain) (Bilateral) (L>R) 12/22/2021   Chronic lower extremity pain (2ry area of Pain) (Bilateral) (L>R) 12/22/2021   Abnormal MRI, lumbar spine (01/31/2020) 12/22/2021   Grade 1 Anterolisthesis of lumbar spine (L3/L4) & (5 mm) (L4/L5) 12/22/2021   Chronic pain syndrome 11/17/2021   High risk medication use 11/17/2021   Disorder of skeletal system 11/17/2021   Problems influencing health status 11/17/2021   Other hyperlipidemia 10/21/2021   Polypharmacy 08/12/2021   Chronic low back pain (1ry area of Pain) (Bilateral) (L>R) w/o sciatica 06/18/2021   Elevated blood pressure reading 05/23/2021   Osteopenia of neck of femur (Right) 09/06/2020   Obesity 08/29/2020   Parkinson disease 02/22/2020   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   Bipolar disorder, in full remission, most recent episode depressed ([place]) 11/08/2019   Vitamin D insufficiency 08/24/2019   Insomnia due to mental condition 05/31/2019   Bipolar I disorder, most recent episode depressed ([place]) 05/31/2019   Alcohol use disorder, moderate, in sustained remission ([place]) 05/31/2019   Elevated tumor markers 05/27/2019   GAD (generalized anxiety disorder) 04/10/2019   MDD (major depressive  disorder), severe ([place]) 01/31/2019   High serum carbohydrate antigen 19-9 (CA19-9) 12/01/2018   Adjustment disorder with anxious mood 11/24/2018   Essential hypertension 11/24/2018   B12 deficiency 10/17/2018   Incisional hernia, without obstruction or gangrene 09/12/2018   Genetic testing 04/07/2018   Family history of breast cancer 03/24/2018   Family history of lung cancer 03/24/2018   Malignant neoplasm of upper-outer quadrant of right breast in female, estrogen receptor positive ([place]) 03/17/2018   Malignant neoplasm of upper-inner quadrant of right breast in female, estrogen receptor positive ([place]) 03/17/2018   History of psychosis 02/07/2018   History of insomnia 02/07/2018    Onset date: PD dx 2021  REFERRING DIAG: Parkinson's disease  THERAPY DIAG:  Dysarthria and anarthria  Cognitive communication deficit  Rationale for Evaluation and Treatment Rehabilitation  SUBJECTIVE:   SUBJECTIVE STATEMENT:  Spouse joined session for education.  Pt accompanied by:  spouse  PERTINENT HISTORY: Patient is a 77 y.o. female with history of Parkinson's disease (dx 2021), anxiety, depression, psychosis, HTN, bipolar disorder, alcoholism, memory issues since 2005, hx head trauma. Referred for LSVT BIG and LOUD; known to our clinic from prior course of therapy completed in 2022.  DIAGNOSTIC FINDINGS: MRI Brain 08/08/2018: "1. No acute intracranial abnormality and largely  unremarkable for age gray and white matter signal. 2. NeuroQuant volumetric analysis of the brain suggests age advanced brain volume loss, see details on [facility] PACS."  PAIN:  Are you having pain? Yes: NPRS scale: 7/10 Pain location: left hips   PATIENT GOALS communicate better  OBJECTIVE:   TODAY'S TREATMENT: Continued communication strategy training to address pt and spouse's concerns with her memory and attention. Reviewed concept and provided examples of how to use memory book/binder with schedule to facilitate  recall, and trained strategies for increasing attention in conversation (chunking information, using written keywords, rephrasing).    PATIENT EDUCATION: Education details: cuing strategies for supporting choicemaking, cuing pt during communication breakdown, strategies to aid pt processing/attention, memory book Person educated: Patient and Spouse Education method: Explanation and Verbal cues Education comprehension: verbalized understanding     GOALS: Goals reviewed with patient? Yes  SHORT TERM GOALS: Target date: 10 sessions  The patient will complete Daily Tasks (Maximum duration "ah", High/Lows, and Functional Phrases) at average loudness >/= 85 dB and with loud, good quality voice with min cues.  Baseline: 10/19/22: 81 dB Goal status: NOT MET  2.  The patient will complete Hierarchal Speech Loudness reading drills (words/phrases, sentences) at average >/= 75 dB and with loud, good quality voice with min cues. Baseline: 10/19/22: 72 dB Goal status: NOT MET  3.  The patient will participate in 5-8 minutes conversation, maintaining average loudness of 75 dB and loud, good quality voice with min cues. Baseline: 10/19/22 72 dB Goal status: NOT MET   LONG TERM GOALS: Target date:   The patient will complete Daily Tasks (Maximum duration "ah", High/Lows, and Functional Phrases) at average loudness >/= 82 dB and with loud, good quality voice Baseline:  Goal status: DEFERRED  2.  The patient will complete Hierarchal Speech Loudness reading drills (words/phrases, sentences, and paragraph) at average >/= 72 dB and with loud, good quality voice. Baseline:  Goal status: DEFERRED  3.  The patient will participate in 10 minutes conversation, maintaining average loudness of >/= 72 dB and loud, good quality voice. Baseline:  Goal status: DEFERRED  4.  Patient will report improved communication effectiveness as measured by Communicative Effectiveness Survey.  Baseline: 18/32 on  09/29/22 Goal status: IN PROGRESS  5. Patient/spouse will demonstrate understanding of strategies to increase pt's communication effectiveness at home (supported conversation, chunking information, written choices, etc).  Baseline:  Goal status: ACHIEVED   ASSESSMENT:  CLINICAL IMPRESSION: Patient is a 77 y.o. female who presents with mild-moderate dysarthria characterized by reduced vocal intensity, reduced pitch variability, hoarse vocal quality, and subjectively low pitch for gender. Due to plateau with loudness and vocal quality plateaued last week, and due to increased need for cuing during LSVT tasks, focus was shifted last week to focus on communication and cognitive strategies. Spouse participated in remaining sessions this week for compensatory strategy training. They are in agreement today with d/c, see goals above. If pt needs ST referral in future, consider focus on communication strategies/effectiveness vs LSVT, due to limited progress with this approach. Should pt/spouse wish to pursue LSVT, recommend considering completing consecutively vs concurrently with PT due to cognitive fatigue.  OBJECTIVE IMPAIRMENTS include attention, memory, awareness, expressive language, receptive language, dysarthria, and voice disorder. These impairments are limiting patient from effectively communicating at home and in community. Factors affecting potential to achieve goals and functional outcome are ability to learn/carryover information and previous level of function.. Patient will benefit from skilled SLP services to address above impairments  and improve overall function.  REHAB POTENTIAL: Good  PLAN: SLP FREQUENCY: 4x/week  SLP DURATION: 4 weeks  PLANNED INTERVENTIONS: Cueing hierachy, Functional tasks, SLP instruction and feedback, Compensatory strategies, Patient/family education, and LSVT-LOUD      Deneise Lever, MS, Actor [phone]

## 2022-11-06 ENCOUNTER — Ambulatory Visit (INDEPENDENT_AMBULATORY_CARE_PROVIDER_SITE_OTHER): Payer: Medicare PPO | Admitting: Family Medicine

## 2022-11-06 ENCOUNTER — Encounter: Payer: Self-pay | Admitting: Family Medicine

## 2022-11-06 VITALS — BP 119/72 | HR 97 | Temp 98.0°F | Resp 16 | Wt 228.0 lb

## 2022-11-06 DIAGNOSIS — I10 Essential (primary) hypertension: Secondary | ICD-10-CM | POA: Diagnosis not present

## 2022-11-06 DIAGNOSIS — M545 Low back pain, unspecified: Secondary | ICD-10-CM | POA: Diagnosis not present

## 2022-11-06 DIAGNOSIS — R739 Hyperglycemia, unspecified: Secondary | ICD-10-CM

## 2022-11-06 DIAGNOSIS — K432 Incisional hernia without obstruction or gangrene: Secondary | ICD-10-CM | POA: Diagnosis not present

## 2022-11-06 DIAGNOSIS — G8929 Other chronic pain: Secondary | ICD-10-CM

## 2022-11-06 DIAGNOSIS — E559 Vitamin D deficiency, unspecified: Secondary | ICD-10-CM

## 2022-11-06 DIAGNOSIS — E669 Obesity, unspecified: Secondary | ICD-10-CM

## 2022-11-06 DIAGNOSIS — E7849 Other hyperlipidemia: Secondary | ICD-10-CM | POA: Diagnosis not present

## 2022-11-06 DIAGNOSIS — R053 Chronic cough: Secondary | ICD-10-CM

## 2022-11-06 DIAGNOSIS — Z6836 Body mass index (BMI) 36.0-36.9, adult: Secondary | ICD-10-CM

## 2022-11-06 DIAGNOSIS — E66812 Obesity, class 2: Secondary | ICD-10-CM

## 2022-11-06 MED ORDER — FAMOTIDINE 20 MG PO TABS: 20.0000 mg | ORAL_TABLET | Freq: Two times a day (BID) | ORAL | 3 refills | Status: DC

## 2022-11-06 NOTE — Assessment & Plan Note (Signed)
Chronic and stable Has had surgical consult Discussed risks versus benefits of repair versus watchful waiting especially as it is not increasing or painful Opts for watchful waiting at this time If that changes in the future, we will refer back to general surgery

## 2022-11-06 NOTE — Assessment & Plan Note (Signed)
Ongoing for greater than 6 months Unclear etiology Lungs clear on exam today Recent chest x-ray was normal Never smoker No B symptoms concerning for malignancy or TB No signs of new onset CHF Discussed that the 2 most common causes of chronic cough are GERD which can be silent or allergic rhinitis Trial of H2 blocker for possible GERD

## 2022-11-06 NOTE — Assessment & Plan Note (Signed)
Well controlled on recheck Continue current medications Recheck metabolic panel F/u in 6 months

## 2022-11-06 NOTE — Assessment & Plan Note (Signed)
Discussed importance of healthy weight management Discussed diet and exercise  

## 2022-11-06 NOTE — Assessment & Plan Note (Signed)
Reviewed last lipid panel Not currently on a statin Recheck FLP and CMP Discussed diet and exercise  

## 2022-11-06 NOTE — Assessment & Plan Note (Signed)
Longstanding problem She possibly had some functional benefit from [facility], but she is unable to recall if she had pain relief or not She is no longer on tramadol or NSAIDs We discussed scheduled Tylenol She is going to take it 1000 mg twice daily regularly and then have a third dose for as needed treatment midday Will continue working with PT If not improving, she may consider repeat ESI with pain management in the future

## 2022-11-07 LAB — LIPID PANEL WITH LDL/HDL RATIO
Cholesterol, Total: 191 mg/dL (ref 100–199)
HDL: 58 mg/dL (ref >39)
LDL Chol Calc (NIH): 113 mg/dL — ABNORMAL HIGH (ref 0–99)
LDL/HDL Ratio: 1.9 ratio (ref 0.0–3.2)
Triglycerides: 112 mg/dL (ref 0–149)
VLDL Cholesterol Cal: 20 mg/dL (ref 5–40)

## 2022-11-07 LAB — COMPREHENSIVE METABOLIC PANEL
ALT: 26 IU/L (ref 0–32)
AST: 17 IU/L (ref 0–40)
Albumin/Globulin Ratio: 2.3 — ABNORMAL HIGH (ref 1.2–2.2)
Albumin: 4.4 g/dL (ref 3.8–4.8)
Alkaline Phosphatase: 37 IU/L — ABNORMAL LOW (ref 44–121)
BUN/Creatinine Ratio: 12 (ref 12–28)
BUN: 9 mg/dL (ref 8–27)
Bilirubin Total: 0.4 mg/dL (ref 0.0–1.2)
CO2: 24 mmol/L (ref 20–29)
Calcium: 9.6 mg/dL (ref 8.7–10.3)
Chloride: 98 mmol/L (ref 96–106)
Creatinine, Ser: 0.75 mg/dL (ref 0.57–1.00)
Globulin, Total: 1.9 g/dL (ref 1.5–4.5)
Glucose: 110 mg/dL — ABNORMAL HIGH (ref 70–99)
Potassium: 4 mmol/L (ref 3.5–5.2)
Sodium: 138 mmol/L (ref 134–144)
Total Protein: 6.3 g/dL (ref 6.0–8.5)
eGFR: 83 mL/min/{1.73_m2} (ref >59)

## 2022-11-07 LAB — HEMOGLOBIN A1C
Est. average glucose Bld gHb Est-mCnc: 126 mg/dL
Hgb A1c MFr Bld: 6 % — ABNORMAL HIGH (ref 4.8–5.6)

## 2022-11-07 LAB — VITAMIN D 25 HYDROXY (VIT D DEFICIENCY, FRACTURES): Vit D, 25-Hydroxy: 15.6 ng/mL — ABNORMAL LOW (ref 30.0–100.0)

## 2022-11-09 DIAGNOSIS — F319 Bipolar disorder, unspecified: Secondary | ICD-10-CM | POA: Diagnosis not present

## 2022-11-09 DIAGNOSIS — G2401 Drug induced subacute dyskinesia: Secondary | ICD-10-CM | POA: Diagnosis not present

## 2022-11-09 DIAGNOSIS — G20C Parkinsonism, unspecified: Secondary | ICD-10-CM | POA: Diagnosis not present

## 2022-11-17 DIAGNOSIS — H6123 Impacted cerumen, bilateral: Secondary | ICD-10-CM | POA: Diagnosis not present

## 2022-11-17 DIAGNOSIS — H903 Sensorineural hearing loss, bilateral: Secondary | ICD-10-CM | POA: Diagnosis not present

## 2022-11-17 DIAGNOSIS — G20C Parkinsonism, unspecified: Secondary | ICD-10-CM | POA: Diagnosis not present

## 2022-11-23 ENCOUNTER — Ambulatory Visit (INDEPENDENT_AMBULATORY_CARE_PROVIDER_SITE_OTHER): Payer: Medicare PPO | Admitting: Licensed Clinical Social Worker

## 2022-11-23 DIAGNOSIS — Z91199 Patient's noncompliance with other medical treatment and regimen due to unspecified reason: Secondary | ICD-10-CM

## 2022-11-23 NOTE — Progress Notes (Signed)
LCSW counselor attempted to connect with patient for scheduled appointment via [facility] phone request x . LCSW counselor left message x 2 for patient to call office number to reschedule OPT appointment.   Attempt 1: Text and email:1:04p  Attempt 2: Text and email: 1:10p  Session was held for pt for 30 min--was closed at 1:31p  Per [facility], after multiple attempts to reach pt unsuccessfully at appointed time--visit will be coded as no show

## 2022-12-03 ENCOUNTER — Ambulatory Visit: Payer: Medicare PPO | Attending: Family Medicine

## 2022-12-03 DIAGNOSIS — R278 Other lack of coordination: Secondary | ICD-10-CM | POA: Insufficient documentation

## 2022-12-03 DIAGNOSIS — R262 Difficulty in walking, not elsewhere classified: Secondary | ICD-10-CM | POA: Insufficient documentation

## 2022-12-03 DIAGNOSIS — M25552 Pain in left hip: Secondary | ICD-10-CM | POA: Diagnosis not present

## 2022-12-03 DIAGNOSIS — R42 Dizziness and giddiness: Secondary | ICD-10-CM

## 2022-12-03 DIAGNOSIS — R2681 Unsteadiness on feet: Secondary | ICD-10-CM | POA: Diagnosis not present

## 2022-12-03 DIAGNOSIS — M6281 Muscle weakness (generalized): Secondary | ICD-10-CM | POA: Diagnosis not present

## 2022-12-03 NOTE — Therapy (Signed)
OUTPATIENT NEURO PHYSICAL THERAPY TREATMENT   Patient Name: Kendra [name] MRN: [id number] DOB:03-Feb-1946, 77 y.o., female Today's Date: 12/03/2022  PCP: Virginia Crews, MD REFERRING PROVIDER: Virginia Crews, MD  END OF SESSION:  PT End of Session - 12/03/22 1600     Visit Number 18    Number of Visits 33    Date for PT Re-Evaluation 12/30/22    PT Start Time 1304    PT Stop Time 1344    PT Time Calculation (min) 40 min    Equipment Utilized During Treatment Gait belt    Activity Tolerance Patient tolerated treatment well;Patient limited by fatigue    Behavior During Therapy [facility] for tasks assessed/performed                    Past Medical History:  Diagnosis Date   Anxiety    Breast cancer ([place]) 03/2018   right breast cancer at 11:00 and 1:00   Bursitis    bilateral hips and knees   Complication of anesthesia    Depression    Family history of breast cancer 03/24/2018   Family history of lung cancer 03/24/2018   History of alcohol dependence ([place]) 2007   no ETOH; resolved since 2007   History of hiatal hernia    History of psychosis 2014   due to sleep disturbance   Hypertension    Parkinson disease    Personal history of radiation therapy 06/2018-07/2018   right breast ca   PONV (postoperative nausea and vomiting)    Past Surgical History:  Procedure Laterality Date   BREAST BIOPSY Right 03/14/2018   11:00 DCIS and invasive ductal carcinoma   BREAST BIOPSY Right 03/14/2018   1:00 Invasive ductal carcinoma   BREAST LUMPECTOMY Right 04/27/2018   lumpectomy of 11 and 1:00 cancers, clear margins, negative LN   BREAST LUMPECTOMY WITH RADIOACTIVE SEED AND SENTINEL LYMPH NODE BIOPSY Right 04/27/2018   Procedure: RIGHT BREAST LUMPECTOMY WITH RADIOACTIVE SEED X 2 AND RIGHT SENTINEL LYMPH NODE BIOPSY;  Surgeon: [name], MD;  Location: [place];  Service: General;  Laterality: Right;   CATARACT EXTRACTION W/PHACO Left  08/30/2019   Procedure: CATARACT EXTRACTION PHACO AND INTRAOCULAR LENS PLACEMENT ([place]) LEFT panoptix toric  01:20.5  12.7%  10.26;  Surgeon: Leandrew Koyanagi, MD;  Location: [place];  Service: Ophthalmology;  Laterality: Left;   CATARACT EXTRACTION W/PHACO Right 09/20/2019   Procedure: CATARACT EXTRACTION PHACO AND INTRAOCULAR LENS PLACEMENT ([facility]) RIGHT 5.71  00:36.4  15.7%;  Surgeon: Leandrew Koyanagi, MD;  Location: [place];  Service: Ophthalmology;  Laterality: Right;   LAPAROTOMY N/A 02/10/2018   Procedure: EXPLORATORY LAPAROTOMY;  Surgeon: Isabel Caprice, MD;  Location: [facility];  Service: Gynecology;  Laterality: N/A;   MASS EXCISION  01/2018   abdominal   OVARIAN CYST REMOVAL     SALPINGOOPHORECTOMY Bilateral 02/10/2018   Procedure: BILATERAL SALPINGO OOPHORECTOMY; PERITONEAL WASHINGS;  Surgeon: Isabel Caprice, MD;  Location: [facility];  Service: Gynecology;  Laterality: Bilateral;   TONSILLECTOMY     TONSILLECTOMY AND ADENOIDECTOMY  1953   Patient Active Problem List   Diagnosis Date Noted   Chronic cough 08/17/2022   History of allergy to shellfish 02/24/2022   Chronic low back pain of over 3 months duration 02/04/2022   Osteoarthritis of  AC (acromioclavicular) joint (Left) 02/04/2022   Osteoarthritis of hip (Right) 02/04/2022   Impaction fracture of distal end of radius and ulna, sequela (Left) 02/04/2022  Osteoarthritis of 1st (thumb) carpometacarpal [facility]) joint of hand (Left) 02/04/2022   Lumbosacral facet arthropathy (Multilevel) (Bilateral) (T12-S1) 02/04/2022   Lumbar central spinal stenosis w/o neurogenic claudication (L2-3, L3-4, L4-5) 02/04/2022   Lumbar foraminal stenosis (Bilateral: L2-3, L4-5) (Right: (Severe) L4-5) 02/04/2022   Lumbar lateral recess stenosis (Bilateral: L2-3, L3-4, and L4-5) (Severe) 02/04/2022   DDD (degenerative disc disease), lumbar 02/04/2022   Lumbosacral facet syndrome (Multilevel) (Bilateral) 02/04/2022    Osteoarthritis of lumbar spine 02/04/2022   Allergic conjunctivitis of both eyes 12/23/2021   Chronic hip pain (3ry area of Pain) (Bilateral) (L>R) 12/22/2021   Chronic lower extremity pain (2ry area of Pain) (Bilateral) (L>R) 12/22/2021   Abnormal MRI, lumbar spine (01/31/2020) 12/22/2021   Grade 1 Anterolisthesis of lumbar spine (L3/L4) & (5 mm) (L4/L5) 12/22/2021   Chronic pain syndrome 11/17/2021   High risk medication use 11/17/2021   Disorder of skeletal system 11/17/2021   Problems influencing health status 11/17/2021   Other hyperlipidemia 10/21/2021   Polypharmacy 08/12/2021   Chronic low back pain (1ry area of Pain) (Bilateral) (L>R) w/o sciatica 06/18/2021   Osteopenia of neck of femur (Right) 09/06/2020   Obesity 08/29/2020   Parkinson disease 02/22/2020   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   Bipolar disorder, in full remission, most recent episode depressed ([place]) 11/08/2019   Vitamin D insufficiency 08/24/2019   Insomnia due to mental condition 05/31/2019   Bipolar I disorder, most recent episode depressed ([place]) 05/31/2019   Alcohol use disorder, moderate, in sustained remission ([place]) 05/31/2019   Elevated tumor markers 05/27/2019   GAD (generalized anxiety disorder) 04/10/2019   MDD (major depressive disorder), severe ([place]) 01/31/2019   High serum carbohydrate antigen 19-9 (CA19-9) 12/01/2018   Adjustment disorder with anxious mood 11/24/2018   Essential hypertension 11/24/2018   B12 deficiency 10/17/2018   Incisional hernia, without obstruction or gangrene 09/12/2018   Genetic testing 04/07/2018   Family history of breast cancer 03/24/2018   Family history of lung cancer 03/24/2018   Malignant neoplasm of upper-outer quadrant of right breast in female, estrogen receptor positive ([place]) 03/17/2018   Malignant neoplasm of upper-inner quadrant of right breast in female, estrogen receptor positive ([place]) 03/17/2018   History of psychosis 02/07/2018   History of insomnia  02/07/2018    ONSET DATE: Pt diagnosed with PD in 2019  REFERRING DIAG:  G20 (ICD-10-CM) - Parkinson's disease      THERAPY DIAG:  Pain in left hip  Muscle weakness (generalized)  Other lack of coordination  Unsteadiness on feet  Difficulty in walking, not elsewhere classified  Dizziness and giddiness  Rationale for Evaluation and Treatment: Rehabilitation  SUBJECTIVE: Pt reports pain felt lateral to L eye, rates moderate. She reports it is a sharp pain and that the pain is  "there and gone" and is unsure when this started. She reports no back or hip pain currently at rest but still experiences it with movement. She reports no recent falls.                                                                              Pain: reports continued L hip pain Pt accompanied by: significant other self  PERTINENT HISTORY:  Pt is a  pleasant 77 yo female who was diagnosed with PD in 2019. She is known to clinic and [facility], and was last seen for LSVT BIG Sept-Oct 2022. Pt with chronic pain and has become increasingly sedentary as a result exacerbating mobility difficulties due to PD. Current concerns: Pt reports L and R hip pain and back pain. Pt was being seen by pain management clinic. Pt has had injections that provided relief at the time. Pt's spouse reports her physician determined surgery was not an option for her pain so the plan is to manage it. Pt spouse thinks pain has resulted in reluctance for pt to move. Pt currently sedentary, doesn't walk much, but when she does she holds on to furniture for support due to balance problems. When pt first stands she must hold on to something due to mix of pain and/or dizziness (described as lightheadedness). Her pain can go from 3/10 to 9/10. It does improve once she starts moving. Pt has increased difficulty with multiple activities including: getting in and out of her bed, getting in and out of her car, freezing, decreased gait speed, decreased  balance and cognition, and difficulty using BUE due to tremors.  Per chart other PMH includes hx of breast cancer, HTN, MCI, insomnia, cataracts, bipolar 1, alcohol use disorder, GAD, MDD, osteopenia of neck of R femur, chronic B low back pain with radiating pain to buttock and posterior thighs and calves. Pt with multiple comorbidities, please refer to chart for full details    PAIN: from eval Are you having pain? Yes: NPRS scale: not rated currently but can reach 9/10 Pain location: L hip and L leg Pain description: pt has difficulty describing Aggravating factors: unsure Relieving factors: laying in the recliner helps, movement  PRECAUTIONS: Fall  WEIGHT BEARING RESTRICTIONS: No  FALLS: Has patient fallen in last 6 months? Yes. Number of falls 1x, leg tangled in chair leg when getting up from a chair  LIVING ENVIRONMENT: Lives with: lives with their spouse Lives in: House/apartment Stairs:  Has following equipment at home:  pt not currently using AD, holds onto walls/furniture, does have walker and SPC at home  PLOF: Independent with basic ADLs  PATIENT GOALS: improve mobility  IMAGING: via chart  07/24/22 DG BONE density " ASSESSMENT: The probability of a major osteoporotic fracture is 15.8% within the next ten years.   The probability of a hip fracture is 2.9% within the next ten years."  12/22/21 DG lumbar spine " IMPRESSION: 1. No acute displaced fracture or traumatic listhesis of the lumbar spine. 2. Interval development of grade 1 anterolisthesis of L3 on L4. Stable grade 1 anterolisthesis of L4 on L5. Appears stable on flexion extension views. 3.  Aortic Atherosclerosis (ICD10-I70.0)."  Please refer to chart for full details  OBJECTIVE:  Neurological and Other Medical Information:   Pt known to clinic some answers via chart (2022) What were your initial symptoms of Parkinson's disease? first diagnosed 2019   Do you have tremors?  Yes: R hand   Do you  have any pain? Yes: B hip and back, refer to above    What are your current symptoms? Difficulty getting out of a chair, sometimes has difficulty getting in and out of bed, difficult with walking, still dresses herself but is mod I and sometimes must sit to dress, requires assistance with bathing, needs assistance with meal prep (due to balance and difficulty using her hands), has general difficulty with sequencing tasks/activities  How many (if any) falls have  you had in the last six months? 1x    Assessment:     MMT: 12/03/2022: strength 4/5 grossly BLE, some pain felt with testing L hip flexion  Donning jacket: 17 seconds Doffing jacket: 7 seconds   Opening jar: 5.7 seconds with multiple attempts   Sit to stand assessment:  5x STS: 27 seconds    Gait assessments:  10 MWT:   0.58 m/s with rollator    PATIENT SURVEYS:  FOTO 40 (goal 49) - filled out by spouse due to pt impaired cognition   PATIENT EDUCATION:  Education details: Pt educated throughout session about proper posture and technique with exercises. Improved exercise technique, movement at target joints, use of target muscles after min to mod verbal, visual, tactile cues. LSVT BIG interventions Person educated: Patient and Spouse Education method: Explanation, Demonstration, and Verbal cues Education comprehension: verbalized understanding, returned demonstration, verbal cues required, tactile cues required, and needs further education   TODAY'S TREATMENT:                                                                                                                                         DATE:   TherAct: Further goal testing completed on this date after pt with absence from PT, please refer to goal section below.   MMT 4/5 grossly BLE, some pain felt with testing L hip flexion   TherEx: Seated march 2x10 each LE. Discontinued due to L hip pain. Rates medium  Nustep (seat 8, arms 11) x lvl 1 x 5 minutes. PT  monitors for pt response throughout, cues pt for technique/SPM Rates medium  HOME EXERCISE PROGRAM:  GOALS: Goals reviewed with patient? Yes  SHORT TERM GOALS: Target date: 10/27/2022    Patient will be independent in home exercise program to improve strength/mobility for better functional independence with ADLs. Baseline: LSVT BIG protocol to be initiated next session; 12/18: not yet fully indep, reports she still gets confused about the technique; 12/03/2022: HEP to be updated Goal status: IN PROGRESS   LONG TERM GOALS: Target date: 11/11/2022   Patient will increase FOTO score to equal to or greater than 49 to demonstrate statistically significant improvement in mobility and quality of life.  Baseline: 40; 12/18: deferred due to time; 45  Goal status: PARTIALLY MET  2.  Patient (> 20 years old) will complete five times sit to stand test in < 15 seconds indicating decreased fall risk.  Baseline: 27 sec; 12/18 23 sec hands-free; 11/04/22: 24 sec hands free; 2/1/124: 22.7 sec use of BUE, 21.3 sec hands-free Goal status: IN PROGRESS  3.  Patient will increase 10 meter walk test to >1.97ms with WFL B step-length, upright posture for >90% of test to improve ease with gait and to override hypokentic and bradykinetic movement.  Baseline: 0.58 m/s with 44HD 12/18: 0.7 m/s with 46QI 11/04/22: 0.69 m/s with 42LN  12/03/22: 0.73 m/s with  1LK Goal status: IN PROGRESS  4.  Patient will reduce time to don jacket by at least 5 seconds in order to increase ease with dressing and override bradykinetic movement Baseline: 17 seconds; 12/18: 12 seconds Goal status: MET  5.  Pt will demo or report ability to open standard jar using only 1-2 attempts in order to exhibit improved functional mobility for ADLs.  Baseline: 5.7 seconds with multiple attempts to turn jar lid to open; 12/18: 2.8 sec opens in 2 turns Goal status: MET  6.  Patient will increase Berg Balance score by > 6 points to demonstrate  decreased fall risk during functional activities. Baseline: to be tested next 1-2 visits; 12/03/2022: 44/56  Goal status: NEW  7.  Patient will report at least 4 days in a row with minimal to no L hip pain in order to improve QOL and ease/safety with functional mobility. Baseline: Pt reports pain majority of days in L hip; 12/03/2022: pt reports having hip pain about every day Goal status: NEW  ASSESSMENT:  CLINICAL IMPRESSION: Pt returns to PT. Further goal reassessment completed. Pt shows improvement with 5xSTS and 10MWT performance, indicating slight decrease in fall risk and improvement in gait speed. However, pt performance on Berg today (44/56), indicates pt still at increased risk for falls. Pt also reports she is experiencing hip pain most days of the week, and this is impacting her mobility as well. Hip assessment to be completed next 1-2 visits. The pt will benefit from further skilled physical therapy to address these deficits to improve overall functional mobility, QOL and to decrease fall risk.  OBJECTIVE IMPAIRMENTS: Abnormal gait, decreased balance, decreased cognition, decreased coordination, decreased endurance, decreased knowledge of use of DME, decreased mobility, difficulty walking, decreased strength, dizziness, hypomobility, impaired UE functional use, improper body mechanics, postural dysfunction, and pain.   ACTIVITY LIMITATIONS: lifting, bending, standing, squatting, stairs, transfers, bed mobility, bathing, dressing, hygiene/grooming, and locomotion level  PARTICIPATION LIMITATIONS: meal prep, cleaning, laundry, medication management, personal finances, driving, shopping, community activity, and yard work  PERSONAL FACTORS: Age, Fitness, Sex, Time since onset of injury/illness/exacerbation, and 3+ comorbidities: PMH includes hx of breast cancer, HTN, MCI, insomnia, cataracts, bipolar 1, alcohol use disorder, GAD, MDD, osteopenia of neck of R femur, chronic B low back pain  with radiating pain to buttock and posterior thighs and calves. Pt with multiple comorbidities, please refer to chart for full details  are also affecting patient's functional outcome.   REHAB POTENTIAL: Fair    CLINICAL DECISION MAKING: Evolving/moderate complexity  EVALUATION COMPLEXITY: High  PLAN:  PT FREQUENCY: 2x/week  PT DURATION: 8 weeks  PLANNED INTERVENTIONS: Therapeutic exercises, Therapeutic activity, Neuromuscular re-education, Balance training, Gait training, Patient/Family education, Self Care, Joint mobilization, Stair training, Vestibular training, Canalith repositioning, Visual/preceptual remediation/compensation, Orthotic/Fit training, DME instructions, Wheelchair mobility training, Spinal mobilization, Cryotherapy, Moist heat, Splintting, Taping, and Manual therapy  PLAN FOR NEXT SESSION: vestibular screen/assessment, hip assessment due to ongoing pain limiting mobility, endurance, gait and balance   Zollie Pee, PT 12/03/2022, 4:09 PM

## 2022-12-08 ENCOUNTER — Ambulatory Visit: Payer: Medicare PPO

## 2022-12-08 DIAGNOSIS — R278 Other lack of coordination: Secondary | ICD-10-CM | POA: Diagnosis not present

## 2022-12-08 DIAGNOSIS — R2681 Unsteadiness on feet: Secondary | ICD-10-CM

## 2022-12-08 DIAGNOSIS — M25552 Pain in left hip: Secondary | ICD-10-CM

## 2022-12-08 DIAGNOSIS — M6281 Muscle weakness (generalized): Secondary | ICD-10-CM

## 2022-12-08 DIAGNOSIS — R262 Difficulty in walking, not elsewhere classified: Secondary | ICD-10-CM

## 2022-12-08 DIAGNOSIS — R42 Dizziness and giddiness: Secondary | ICD-10-CM | POA: Diagnosis not present

## 2022-12-08 NOTE — Therapy (Signed)
OUTPATIENT NEURO PHYSICAL THERAPY TREATMENT   Patient Name: Kendra [name] MRN: [id number] DOB:[birth year], 77 y.o., female Today's Date: 12/08/2022  PCP: Virginia Crews, MD REFERRING PROVIDER: Virginia Crews, MD  END OF SESSION:  PT End of Session - 12/08/22 1733     Visit Number 19    Number of Visits 33    Date for PT Re-Evaluation 12/30/22    PT Start Time 1315    PT Stop Time 1345    PT Time Calculation (min) 30 min    Equipment Utilized During Treatment Gait belt    Activity Tolerance Patient tolerated treatment well;Patient limited by fatigue    Behavior During Therapy [facility] for tasks assessed/performed                     Past Medical History:  Diagnosis Date   Anxiety    Breast cancer ([place]) 03/2018   right breast cancer at 11:00 and 1:00   Bursitis    bilateral hips and knees   Complication of anesthesia    Depression    Family history of breast cancer 03/24/2018   Family history of lung cancer 03/24/2018   History of alcohol dependence ([place]) 2007   no ETOH; resolved since 2007   History of hiatal hernia    History of psychosis 2014   due to sleep disturbance   Hypertension    Parkinson disease    Personal history of radiation therapy 06/2018-07/2018   right breast ca   PONV (postoperative nausea and vomiting)    Past Surgical History:  Procedure Laterality Date   BREAST BIOPSY Right 03/14/2018   11:00 DCIS and invasive ductal carcinoma   BREAST BIOPSY Right 03/14/2018   1:00 Invasive ductal carcinoma   BREAST LUMPECTOMY Right 04/27/2018   lumpectomy of 11 and 1:00 cancers, clear margins, negative LN   BREAST LUMPECTOMY WITH RADIOACTIVE SEED AND SENTINEL LYMPH NODE BIOPSY Right 04/27/2018   Procedure: RIGHT BREAST LUMPECTOMY WITH RADIOACTIVE SEED X 2 AND RIGHT SENTINEL LYMPH NODE BIOPSY;  Surgeon: [name], MD;  Location: [place];  Service: General;  Laterality: Right;   CATARACT EXTRACTION W/PHACO Left  08/30/2019   Procedure: CATARACT EXTRACTION PHACO AND INTRAOCULAR LENS PLACEMENT ([place]) LEFT panoptix toric  01:20.5  12.7%  10.26;  Surgeon: Leandrew Koyanagi, MD;  Location: [place];  Service: Ophthalmology;  Laterality: Left;   CATARACT EXTRACTION W/PHACO Right 09/20/2019   Procedure: CATARACT EXTRACTION PHACO AND INTRAOCULAR LENS PLACEMENT ([facility]) RIGHT 5.71  00:36.4  15.7%;  Surgeon: Leandrew Koyanagi, MD;  Location: [place];  Service: Ophthalmology;  Laterality: Right;   LAPAROTOMY N/A 02/10/2018   Procedure: EXPLORATORY LAPAROTOMY;  Surgeon: Isabel Caprice, MD;  Location: [facility];  Service: Gynecology;  Laterality: N/A;   MASS EXCISION  01/2018   abdominal   OVARIAN CYST REMOVAL     SALPINGOOPHORECTOMY Bilateral 02/10/2018   Procedure: BILATERAL SALPINGO OOPHORECTOMY; PERITONEAL WASHINGS;  Surgeon: Isabel Caprice, MD;  Location: [facility];  Service: Gynecology;  Laterality: Bilateral;   TONSILLECTOMY     TONSILLECTOMY AND ADENOIDECTOMY  1953   Patient Active Problem List   Diagnosis Date Noted   Chronic cough 08/17/2022   History of allergy to shellfish 02/24/2022   Chronic low back pain of over 3 months duration 02/04/2022   Osteoarthritis of  AC (acromioclavicular) joint (Left) 02/04/2022   Osteoarthritis of hip (Right) 02/04/2022   Impaction fracture of distal end of radius and ulna, sequela (Left) 02/04/2022  Osteoarthritis of 1st (thumb) carpometacarpal [facility]) joint of hand (Left) 02/04/2022   Lumbosacral facet arthropathy (Multilevel) (Bilateral) (T12-S1) 02/04/2022   Lumbar central spinal stenosis w/o neurogenic claudication (L2-3, L3-4, L4-5) 02/04/2022   Lumbar foraminal stenosis (Bilateral: L2-3, L4-5) (Right: (Severe) L4-5) 02/04/2022   Lumbar lateral recess stenosis (Bilateral: L2-3, L3-4, and L4-5) (Severe) 02/04/2022   DDD (degenerative disc disease), lumbar 02/04/2022   Lumbosacral facet syndrome (Multilevel) (Bilateral) 02/04/2022    Osteoarthritis of lumbar spine 02/04/2022   Allergic conjunctivitis of both eyes 12/23/2021   Chronic hip pain (3ry area of Pain) (Bilateral) (L>R) 12/22/2021   Chronic lower extremity pain (2ry area of Pain) (Bilateral) (L>R) 12/22/2021   Abnormal MRI, lumbar spine (01/31/2020) 12/22/2021   Grade 1 Anterolisthesis of lumbar spine (L3/L4) & (5 mm) (L4/L5) 12/22/2021   Chronic pain syndrome 11/17/2021   High risk medication use 11/17/2021   Disorder of skeletal system 11/17/2021   Problems influencing health status 11/17/2021   Other hyperlipidemia 10/21/2021   Polypharmacy 08/12/2021   Chronic low back pain (1ry area of Pain) (Bilateral) (L>R) w/o sciatica 06/18/2021   Osteopenia of neck of femur (Right) 09/06/2020   Obesity 08/29/2020   Parkinson disease 02/22/2020   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   Bipolar disorder, in full remission, most recent episode depressed ([place]) 11/08/2019   Vitamin D insufficiency 08/24/2019   Insomnia due to mental condition 05/31/2019   Bipolar I disorder, most recent episode depressed ([place]) 05/31/2019   Alcohol use disorder, moderate, in sustained remission ([place]) 05/31/2019   Elevated tumor markers 05/27/2019   GAD (generalized anxiety disorder) 04/10/2019   MDD (major depressive disorder), severe ([place]) 01/31/2019   High serum carbohydrate antigen 19-9 (CA19-9) 12/01/2018   Adjustment disorder with anxious mood 11/24/2018   Essential hypertension 11/24/2018   B12 deficiency 10/17/2018   Incisional hernia, without obstruction or gangrene 09/12/2018   Genetic testing 04/07/2018   Family history of breast cancer 03/24/2018   Family history of lung cancer 03/24/2018   Malignant neoplasm of upper-outer quadrant of right breast in female, estrogen receptor positive ([place]) 03/17/2018   Malignant neoplasm of upper-inner quadrant of right breast in female, estrogen receptor positive ([place]) 03/17/2018   History of psychosis 02/07/2018   History of insomnia  02/07/2018    ONSET DATE: Pt diagnosed with PD in 2019  REFERRING DIAG:  G20 (ICD-10-CM) - Parkinson's disease      THERAPY DIAG:  Muscle weakness (generalized)  Difficulty in walking, not elsewhere classified  Pain in left hip  Unsteadiness on feet  Dizziness and giddiness  Rationale for Evaluation and Treatment: Rehabilitation  SUBJECTIVE: Pt reports no pain currently. While she doesn't have any hip pain at rest she reports she still has it from time to time. Pt reports no falls/stumbles. Pt reports no dizziness or lightheadedness currently. She has experienced dizziness/lightheadedness in last week. Pt reports no other updates.                                                                             Pain: no pain currently Pt accompanied by: significant other self  PERTINENT HISTORY:  Pt is a pleasant 77 yo female who was diagnosed with PD in 2019. She is known  to clinic and [facility], and was last seen for LSVT BIG Sept-Oct 2022. Pt with chronic pain and has become increasingly sedentary as a result exacerbating mobility difficulties due to PD. Current concerns: Pt reports L and R hip pain and back pain. Pt was being seen by pain management clinic. Pt has had injections that provided relief at the time. Pt's spouse reports her physician determined surgery was not an option for her pain so the plan is to manage it. Pt spouse thinks pain has resulted in reluctance for pt to move. Pt currently sedentary, doesn't walk much, but when she does she holds on to furniture for support due to balance problems. When pt first stands she must hold on to something due to mix of pain and/or dizziness (described as lightheadedness). Her pain can go from 3/10 to 9/10. It does improve once she starts moving. Pt has increased difficulty with multiple activities including: getting in and out of her bed, getting in and out of her car, freezing, decreased gait speed, decreased balance and cognition, and  difficulty using BUE due to tremors.  Per chart other PMH includes hx of breast cancer, HTN, MCI, insomnia, cataracts, bipolar 1, alcohol use disorder, GAD, MDD, osteopenia of neck of R femur, chronic B low back pain with radiating pain to buttock and posterior thighs and calves. Pt with multiple comorbidities, please refer to chart for full details    PAIN: from eval Are you having pain? Yes: NPRS scale: not rated currently but can reach 9/10 Pain location: L hip and L leg Pain description: pt has difficulty describing Aggravating factors: unsure Relieving factors: laying in the recliner helps, movement  PRECAUTIONS: Fall  WEIGHT BEARING RESTRICTIONS: No  FALLS: Has patient fallen in last 6 months? Yes. Number of falls 1x, leg tangled in chair leg when getting up from a chair  LIVING ENVIRONMENT: Lives with: lives with their spouse Lives in: House/apartment Stairs:  Has following equipment at home:  pt not currently using AD, holds onto walls/furniture, does have walker and SPC at home  PLOF: Independent with basic ADLs  PATIENT GOALS: improve mobility  IMAGING: via chart  07/24/22 DG BONE density " ASSESSMENT: The probability of a major osteoporotic fracture is 15.8% within the next ten years.   The probability of a hip fracture is 2.9% within the next ten years."  12/22/21 DG lumbar spine " IMPRESSION: 1. No acute displaced fracture or traumatic listhesis of the lumbar spine. 2. Interval development of grade 1 anterolisthesis of L3 on L4. Stable grade 1 anterolisthesis of L4 on L5. Appears stable on flexion extension views. 3.  Aortic Atherosclerosis (ICD10-I70.0)."  Please refer to chart for full details  OBJECTIVE:  Neurological and Other Medical Information:   Pt known to clinic some answers via chart (2022) What were your initial symptoms of Parkinson's disease? first diagnosed 2019   Do you have tremors?  Yes: R hand   Do you have any pain? Yes: B hip and  back, refer to above    What are your current symptoms? Difficulty getting out of a chair, sometimes has difficulty getting in and out of bed, difficult with walking, still dresses herself but is mod I and sometimes must sit to dress, requires assistance with bathing, needs assistance with meal prep (due to balance and difficulty using her hands), has general difficulty with sequencing tasks/activities  How many (if any) falls have you had in the last six months? 1x    Assessment:  MMT: 12/03/2022: strength 4/5 grossly BLE, some pain felt with testing L hip flexion  Donning jacket: 17 seconds Doffing jacket: 7 seconds   Opening jar: 5.7 seconds with multiple attempts   Sit to stand assessment:  5x STS: 27 seconds    Gait assessments:  10 MWT:   0.58 m/s with rollator    PATIENT SURVEYS:  FOTO 40 (goal 49) - filled out by spouse due to pt impaired cognition   PATIENT EDUCATION:  Education details: Pt educated throughout session about proper posture and technique with exercises. Improved exercise technique, movement at target joints, use of target muscles after min to mod verbal, visual, tactile cues. LSVT BIG interventions Person educated: Patient and Spouse Education method: Explanation, Demonstration, and Verbal cues Education comprehension: verbalized understanding, returned demonstration, verbal cues required, tactile cues required, and needs further education   TODAY'S TREATMENT:                                                                                                                                         DATE:   NMR:  Oculomotor screen: ROM: WNL Smooth pursuits: saccadic throughout, abnormal Saccades: hypometric, undershoots corrective saccade when testing to R side Convergence/divergence: WNL Spontaneous nystagmus: absent End-gaze nystagmus: absent   Slow VOR: WNL   Instructed in seated VORx1 with vertical and horizontal head turns 2x30 sec for each.  Multi-modal cuing throughout for technique. Pt reports this does make her feel dizzy. Pt did report feeling some strain felt on L side (near temporalis and orbicularis mm) following interventions.  TherEx: Gait for endurance with RW 2x148 ft. Limited due to onset of L hip pain, pt also required seated rest break due to fatigue.   Seated BP following gait/endurance intervention: 131/76 mmHg, HR 79 bpm. No symptoms  HOME EXERCISE PROGRAM:  GOALS: Goals reviewed with patient? Yes  SHORT TERM GOALS: Target date: 10/27/2022    Patient will be independent in home exercise program to improve strength/mobility for better functional independence with ADLs. Baseline: LSVT BIG protocol to be initiated next session; 12/18: not yet fully indep, reports she still gets confused about the technique; 12/03/2022: HEP to be updated Goal status: IN PROGRESS   LONG TERM GOALS: Target date: 11/11/2022   Patient will increase FOTO score to equal to or greater than 49 to demonstrate statistically significant improvement in mobility and quality of life.  Baseline: 40; 12/18: deferred due to time; 45  Goal status: PARTIALLY MET  2.  Patient (> 51 years old) will complete five times sit to stand test in < 15 seconds indicating decreased fall risk.  Baseline: 27 sec; 12/18 23 sec hands-free; 11/04/22: 24 sec hands free; 2/1/124: 22.7 sec use of BUE, 21.3 sec hands-free Goal status: IN PROGRESS  3.  Patient will increase 10 meter walk test to >1.73ms with WFL B step-length, upright posture for >90% of test to improve ease  with gait and to override hypokentic and bradykinetic movement.  Baseline: 0.58 m/s with 8NI; 12/18: 0.7 m/s with 7PO; 11/04/22: 0.69 m/s with 2UM;  12/03/22: 0.73 m/s with 4WW Goal status: IN PROGRESS  4.  Patient will reduce time to don jacket by at least 5 seconds in order to increase ease with dressing and override bradykinetic movement Baseline: 17 seconds; 12/18: 12 seconds Goal status:  MET  5.  Pt will demo or report ability to open standard jar using only 1-2 attempts in order to exhibit improved functional mobility for ADLs.  Baseline: 5.7 seconds with multiple attempts to turn jar lid to open; 12/18: 2.8 sec opens in 2 turns Goal status: MET  6.  Patient will increase Berg Balance score by > 6 points to demonstrate decreased fall risk during functional activities. Baseline: to be tested next 1-2 visits; 12/03/2022: 44/56  Goal status: NEW  7.  Patient will report at least 4 days in a row with minimal to no L hip pain in order to improve QOL and ease/safety with functional mobility. Baseline: Pt reports pain majority of days in L hip; 12/03/2022: pt reports having hip pain about every day Goal status: NEW  ASSESSMENT:  CLINICAL IMPRESSION: Session limited secondary to pt late arrival. However, pt with excellent motivation to participate in session. Oculomotor screen completed to address dizziness impairment affecting pt's mobility, balance and gait. Pt found to have abnormal oculomotor screen, and initiated seated VORx1 on this date. This did bring on patient's symptoms which then decreased with rest and cessation of intervention. Pt also still limited by quick onset of fatigue and hip pain with gait. The pt will benefit from further skilled physical therapy to address these deficits to improve overall functional mobility, QOL and to decrease fall risk.  OBJECTIVE IMPAIRMENTS: Abnormal gait, decreased balance, decreased cognition, decreased coordination, decreased endurance, decreased knowledge of use of DME, decreased mobility, difficulty walking, decreased strength, dizziness, hypomobility, impaired UE functional use, improper body mechanics, postural dysfunction, and pain.   ACTIVITY LIMITATIONS: lifting, bending, standing, squatting, stairs, transfers, bed mobility, bathing, dressing, hygiene/grooming, and locomotion level  PARTICIPATION LIMITATIONS: meal prep, cleaning,  laundry, medication management, personal finances, driving, shopping, community activity, and yard work  PERSONAL FACTORS: Age, Fitness, Sex, Time since onset of injury/illness/exacerbation, and 3+ comorbidities: PMH includes hx of breast cancer, HTN, MCI, insomnia, cataracts, bipolar 1, alcohol use disorder, GAD, MDD, osteopenia of neck of R femur, chronic B low back pain with radiating pain to buttock and posterior thighs and calves. Pt with multiple comorbidities, please refer to chart for full details  are also affecting patient's functional outcome.   REHAB POTENTIAL: Fair    CLINICAL DECISION MAKING: Evolving/moderate complexity  EVALUATION COMPLEXITY: High  PLAN:  PT FREQUENCY: 2x/week  PT DURATION: 8 weeks  PLANNED INTERVENTIONS: Therapeutic exercises, Therapeutic activity, Neuromuscular re-education, Balance training, Gait training, Patient/Family education, Self Care, Joint mobilization, Stair training, Vestibular training, Canalith repositioning, Visual/preceptual remediation/compensation, Orthotic/Fit training, DME instructions, Wheelchair mobility training, Spinal mobilization, Cryotherapy, Moist heat, Splintting, Taping, and Manual therapy  PLAN FOR NEXT SESSION: hip assessment due to ongoing pain limiting mobility, endurance, gait and balance   Zollie Pee, PT 12/08/2022, 5:39 PM

## 2022-12-10 ENCOUNTER — Ambulatory Visit: Payer: Medicare PPO

## 2022-12-10 DIAGNOSIS — M25552 Pain in left hip: Secondary | ICD-10-CM

## 2022-12-10 DIAGNOSIS — R278 Other lack of coordination: Secondary | ICD-10-CM | POA: Diagnosis not present

## 2022-12-10 DIAGNOSIS — R42 Dizziness and giddiness: Secondary | ICD-10-CM | POA: Diagnosis not present

## 2022-12-10 DIAGNOSIS — R2681 Unsteadiness on feet: Secondary | ICD-10-CM

## 2022-12-10 DIAGNOSIS — M6281 Muscle weakness (generalized): Secondary | ICD-10-CM

## 2022-12-10 DIAGNOSIS — R262 Difficulty in walking, not elsewhere classified: Secondary | ICD-10-CM | POA: Diagnosis not present

## 2022-12-10 NOTE — Therapy (Addendum)
OUTPATIENT NEURO PHYSICAL THERAPY TREATMENT Physical Therapy Progress Note   Dates of reporting period  10/19/22   to   12/10/22    Patient Name: Kendra [name] MRN: [id number] DOB:24-Sep-1946, 77 y.o., female Today's Date: 12/10/2022  PCP: Virginia Crews, MD REFERRING PROVIDER: Virginia Crews, MD  END OF SESSION:  PT End of Session - 12/10/22 1311     Visit Number 20    Number of Visits 33    Date for PT Re-Evaluation 12/30/22    Authorization Type humana Medicare auth    Authorization Time Period 11/04/2022-12/30/2022    PT Start Time 44    PT Stop Time 1340    PT Time Calculation (min) 40 min    Activity Tolerance Patient tolerated treatment well;Patient limited by fatigue    Behavior During Therapy [facility] for tasks assessed/performed                     Past Medical History:  Diagnosis Date   Anxiety    Breast cancer ([place]) 03/2018   right breast cancer at 11:00 and 1:00   Bursitis    bilateral hips and knees   Complication of anesthesia    Depression    Family history of breast cancer 03/24/2018   Family history of lung cancer 03/24/2018   History of alcohol dependence ([place]) 2007   no ETOH; resolved since 2007   History of hiatal hernia    History of psychosis 2014   due to sleep disturbance   Hypertension    Parkinson disease    Personal history of radiation therapy 06/2018-07/2018   right breast ca   PONV (postoperative nausea and vomiting)    Past Surgical History:  Procedure Laterality Date   BREAST BIOPSY Right 03/14/2018   11:00 DCIS and invasive ductal carcinoma   BREAST BIOPSY Right 03/14/2018   1:00 Invasive ductal carcinoma   BREAST LUMPECTOMY Right 04/27/2018   lumpectomy of 11 and 1:00 cancers, clear margins, negative LN   BREAST LUMPECTOMY WITH RADIOACTIVE SEED AND SENTINEL LYMPH NODE BIOPSY Right 04/27/2018   Procedure: RIGHT BREAST LUMPECTOMY WITH RADIOACTIVE SEED X 2 AND RIGHT SENTINEL LYMPH NODE BIOPSY;  Surgeon: [name], MD;  Location: [place];  Service: General;  Laterality: Right;   CATARACT EXTRACTION W/PHACO Left 08/30/2019   Procedure: CATARACT EXTRACTION PHACO AND INTRAOCULAR LENS PLACEMENT ([place]) LEFT panoptix toric  01:20.5  12.7%  10.26;  Surgeon: Leandrew Koyanagi, MD;  Location: [place];  Service: Ophthalmology;  Laterality: Left;   CATARACT EXTRACTION W/PHACO Right 09/20/2019   Procedure: CATARACT EXTRACTION PHACO AND INTRAOCULAR LENS PLACEMENT ([facility]) RIGHT 5.71  00:36.4  15.7%;  Surgeon: Leandrew Koyanagi, MD;  Location: [place];  Service: Ophthalmology;  Laterality: Right;   LAPAROTOMY N/A 02/10/2018   Procedure: EXPLORATORY LAPAROTOMY;  Surgeon: Isabel Caprice, MD;  Location: [facility];  Service: Gynecology;  Laterality: N/A;   MASS EXCISION  01/2018   abdominal   OVARIAN CYST REMOVAL     SALPINGOOPHORECTOMY Bilateral 02/10/2018   Procedure: BILATERAL SALPINGO OOPHORECTOMY; PERITONEAL WASHINGS;  Surgeon: Isabel Caprice, MD;  Location: [facility];  Service: Gynecology;  Laterality: Bilateral;   TONSILLECTOMY     TONSILLECTOMY AND ADENOIDECTOMY  1953   Patient Active Problem List   Diagnosis Date Noted   Chronic cough 08/17/2022   History of allergy to shellfish 02/24/2022   Chronic low back pain of over 3 months duration 02/04/2022   Osteoarthritis of  AC (  acromioclavicular) joint (Left) 02/04/2022   Osteoarthritis of hip (Right) 02/04/2022   Impaction fracture of distal end of radius and ulna, sequela (Left) 02/04/2022   Osteoarthritis of 1st (thumb) carpometacarpal (CMC) joint of hand (Left) 02/04/2022   Lumbosacral facet arthropathy (Multilevel) (Bilateral) (T12-S1) 02/04/2022   Lumbar central spinal stenosis w/o neurogenic claudication (L2-3, L3-4, L4-5) 02/04/2022   Lumbar foraminal stenosis (Bilateral: L2-3, L4-5) (Right: (Severe) L4-5) 02/04/2022   Lumbar lateral recess stenosis (Bilateral: L2-3, L3-4, and L4-5) (Severe) 02/04/2022    DDD (degenerative disc disease), lumbar 02/04/2022   Lumbosacral facet syndrome (Multilevel) (Bilateral) 02/04/2022   Osteoarthritis of lumbar spine 02/04/2022   Allergic conjunctivitis of both eyes 12/23/2021   Chronic hip pain (3ry area of Pain) (Bilateral) (L>R) 12/22/2021   Chronic lower extremity pain (2ry area of Pain) (Bilateral) (L>R) 12/22/2021   Abnormal MRI, lumbar spine (01/31/2020) 12/22/2021   Grade 1 Anterolisthesis of lumbar spine (L3/L4) & (5 mm) (L4/L5) 12/22/2021   Chronic pain syndrome 11/17/2021   High risk medication use 11/17/2021   Disorder of skeletal system 11/17/2021   Problems influencing health status 11/17/2021   Other hyperlipidemia 10/21/2021   Polypharmacy 08/12/2021   Chronic low back pain (1ry area of Pain) (Bilateral) (L>R) w/o sciatica 06/18/2021   Osteopenia of neck of femur (Right) 09/06/2020   Obesity 08/29/2020   Parkinson disease 02/22/2020   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   Bipolar disorder, in full remission, most recent episode depressed ([place]) 11/08/2019   Vitamin D insufficiency 08/24/2019   Insomnia due to mental condition 05/31/2019   Bipolar I disorder, most recent episode depressed ([place]) 05/31/2019   Alcohol use disorder, moderate, in sustained remission ([place]) 05/31/2019   Elevated tumor markers 05/27/2019   GAD (generalized anxiety disorder) 04/10/2019   MDD (major depressive disorder), severe ([place]) 01/31/2019   High serum carbohydrate antigen 19-9 (CA19-9) 12/01/2018   Adjustment disorder with anxious mood 11/24/2018   Essential hypertension 11/24/2018   B12 deficiency 10/17/2018   Incisional hernia, without obstruction or gangrene 09/12/2018   Genetic testing 04/07/2018   Family history of breast cancer 03/24/2018   Family history of lung cancer 03/24/2018   Malignant neoplasm of upper-outer quadrant of right breast in female, estrogen receptor positive ([place]) 03/17/2018   Malignant neoplasm of upper-inner quadrant of right  breast in female, estrogen receptor positive ([place]) 03/17/2018   History of psychosis 02/07/2018   History of insomnia 02/07/2018    ONSET DATE: Pt diagnosed with PD in 2019  REFERRING DIAG:  G20 (ICD-10-CM) - Parkinson's disease      THERAPY DIAG:  Muscle weakness (generalized)  Difficulty in walking, not elsewhere classified  Pain in left hip  Unsteadiness on feet  Rationale for Evaluation and Treatment: Rehabilitation  SUBJECTIVE: Pt reports no pain currently. While she doesn't have any hip pain at rest she reports she still has it from time to time. Pt reports no falls/stumbles. Pt reports no dizziness or lightheadedness currently. She has experienced dizziness/lightheadedness in last week. Pt reports no other updates.                                                                             Pain: no pain currently Pt accompanied by: significant  other self  PERTINENT HISTORY:  Pt is a pleasant 77 yo female who was diagnosed with PD in 2019. She is known to clinic and [facility], and was last seen for LSVT BIG Sept-Oct 2022. Pt with chronic pain and has become increasingly sedentary as a result exacerbating mobility difficulties due to PD. Current concerns: Pt reports L and R hip pain and back pain. Pt was being seen by pain management clinic. Pt has had injections that provided relief at the time. Pt's spouse reports her physician determined surgery was not an option for her pain so the plan is to manage it. Pt spouse thinks pain has resulted in reluctance for pt to move. Pt currently sedentary, doesn't walk much, but when she does she holds on to furniture for support due to balance problems. When pt first stands she must hold on to something due to mix of pain and/or dizziness (described as lightheadedness). Her pain can go from 3/10 to 9/10. It does improve once she starts moving. Pt has increased difficulty with multiple activities including: getting in and out of her bed, getting  in and out of her car, freezing, decreased gait speed, decreased balance and cognition, and difficulty using BUE due to tremors.  Per chart other PMH includes hx of breast cancer, HTN, MCI, insomnia, cataracts, bipolar 1, alcohol use disorder, GAD, MDD, osteopenia of neck of R femur, chronic B low back pain with radiating pain to buttock and posterior thighs and calves. Pt with multiple comorbidities, please refer to chart for full details    PAIN: from eval Are you having pain? Yes: NPRS scale: not rated currently but can reach 9/10 Pain location: L hip and L leg Pain description: pt has difficulty describing Aggravating factors: unsure Relieving factors: laying in the recliner helps, movement  PRECAUTIONS: Fall  WEIGHT BEARING RESTRICTIONS: No  FALLS: Has patient fallen in last 6 months? Yes. Number of falls 1x, leg tangled in chair leg when getting up from a chair  PLOF: Independent with basic ADLs  PATIENT GOALS: improve mobility   BLE Strength Assessment      Right  Left  Hip Flexion 5/5 4/5 (painful)  Hip Horizontal ABDCT  5/5 5/5  Hip Horizontal ADD 4+/5 4+/5  Hip IR (seated)  4+/5 4+/5  Hip ER (seated) 5/5 5/5  Knee Extension 5/5 5/5  Knee Flexion (seated) 4+/5  5/5 (not painful)  Hip ABDCT (supine)  3+/5 4-/5   Focal popliteal edema Left knee, not painful   ROM Assessment Hip     Right  Left   Flexion Supine 132 120 (ouch)  External rotation (90/90) 30 47  Internal Rotation (90/90) 40  20  Supine Hip Extension P/ROM >0 degrees >0 degrees     Hook[place] bridge assessment:  -a known area of weakness and aggravation to patient -increased pain in left thigh  -loss of isometric spine control, excessive posterior pelvic tile prior to lift off   -Long axis distraction lleft c belt, towel to pad ankle 2x30sec  -deep palpation hamstrings insertion on Left ischeal tuberosity, confirmed symptomatic pain.  -tenderness and taut bands in hamstrings left, compared to soft,  comfy Rt hamstrings -left hamstrings sustained release x90 sec, improved tenderness  PATIENT EDUCATION:  Education details: Pt educated throughout session about proper posture and technique with exercises. Improved exercise technique, movement at target joints, use of target muscles after min to mod verbal, visual, tactile cues. LSVT BIG interventions Person educated: Patient and Spouse Education method: Explanation, Demonstration,  and Verbal cues Education comprehension: verbalized understanding, returned demonstration, verbal cues required, tactile cues required, and needs further education   TODAY'S TREATMENT:                                                                                                                                         DATE: 12/10/22   10x STS (5x STS time 19.8sec) hands free  -Long axis distraction lleft c belt, towel to pad ankle 2x30sec  -deep palpation hamstrings insertion on Left ischeal tuberosity, confirmed symptomatic pain.  -tenderness and taut bands in hamstrings left, compared to soft, comfy Rt hamstrings -left hamstrings sustained release x90 sec, improved tenderness -seated Left hamstrings curl 1x12 @ 7.5    HOME EXERCISE PROGRAM: None issued  GOALS: Goals reviewed with patient? Yes  SHORT TERM GOALS: Target date: 10/27/2022    Patient will be independent in home exercise program to improve strength/mobility for better functional independence with ADLs. Baseline: LSVT BIG protocol to be initiated next session; 12/18: not yet fully indep, reports she still gets confused about the technique; 12/03/2022: HEP to be updated Goal status: IN PROGRESS   LONG TERM GOALS: Target date: 11/11/2022   Patient will increase FOTO score to equal to or greater than 49 to demonstrate statistically significant improvement in mobility and quality of life.  Baseline: 40; 12/18: deferred due to time; 45  Goal status: PARTIALLY MET  2.  Patient (> 28 years  old) will complete five times sit to stand test in < 15 seconds indicating decreased fall risk.  Baseline: 27 sec; 12/18 23 sec hands-free; 11/04/22: 24 sec hands free; 2/1/124: 22.7 sec use of BUE, 21.3 sec hands-free Goal status: IN PROGRESS  3.  Patient will increase 10 meter walk test to >1.79ms with WFL B step-length, upright posture for >90% of test to improve ease with gait and to override hypokentic and bradykinetic movement.  Baseline: 0.58 m/s with [id number] 12/18: 0.7 m/s with [id number] 11/04/22: 0.69 m/s with [id number]  12/03/22: 0.73 m/s with 4WW Goal status: IN PROGRESS  4.  Patient will reduce time to don jacket by at least 5 seconds in order to increase ease with dressing and override bradykinetic movement Baseline: 17 seconds; 12/18: 12 seconds Goal status: MET  5.  Pt will demo or report ability to open standard jar using only 1-2 attempts in order to exhibit improved functional mobility for ADLs.  Baseline: 5.7 seconds with multiple attempts to turn jar lid to open; 12/18: 2.8 sec opens in 2 turns Goal status: MET  6.  Patient will increase Berg Balance score by > 6 points to demonstrate decreased fall risk during functional activities. Baseline: to be tested next 1-2 visits; 12/03/2022: 44/56  Goal status: NEW  7.  Patient will report at least 4 days in a row with minimal to no L hip pain in order to improve QOL and ease/safety  with functional mobility. Baseline: Pt reports pain majority of days in L hip; 12/03/2022: pt reports having hip pain about every day Goal status: NEW  ASSESSMENT:  CLINICAL IMPRESSION: No pain on arrival or exit. Full hip assessment today as planned, noted ROM reduction and end range pain on left side that correlates with moderate hip OA findings. Pt reports OA pain to be different that her CC pain, which upon examination has all the typical hallmark of hamstrings tendinopathy. Pt also has associated myofascial dysfunction which is common empirically. Favorable response  to LAD to hip joint, sustained release to hamstrings and focal hamstrings moderate loading will continue to evaluate in next seevral session, as symptoms response to loading will add additional layer of certainty to diagnosis.    OBJECTIVE IMPAIRMENTS: Abnormal gait, decreased balance, decreased cognition, decreased coordination, decreased endurance, decreased knowledge of use of DME, decreased mobility, difficulty walking, decreased strength, dizziness, hypomobility, impaired UE functional use, improper body mechanics, postural dysfunction, and pain.   ACTIVITY LIMITATIONS: lifting, bending, standing, squatting, stairs, transfers, bed mobility, bathing, dressing, hygiene/grooming, and locomotion level  PARTICIPATION LIMITATIONS: meal prep, cleaning, laundry, medication management, personal finances, driving, shopping, community activity, and yard work  PERSONAL FACTORS: Age, Fitness, Sex, Time since onset of injury/illness/exacerbation, and 3+ comorbidities: PMH includes hx of breast cancer, HTN, MCI, insomnia, cataracts, bipolar 1, alcohol use disorder, GAD, MDD, osteopenia of neck of R femur, chronic B low back pain with radiating pain to buttock and posterior thighs and calves. Pt with multiple comorbidities, please refer to chart for full details  are also affecting patient's functional outcome.   REHAB POTENTIAL: Fair    CLINICAL DECISION MAKING: Evolving/moderate complexity  EVALUATION COMPLEXITY: High  PLAN:  PT FREQUENCY: 2x/week  PT DURATION: 8 weeks  PLANNED INTERVENTIONS: Therapeutic exercises, Therapeutic activity, Neuromuscular re-education, Balance training, Gait training, Patient/Family education, Self Care, Joint mobilization, Stair training, Vestibular training, Canalith repositioning, Visual/preceptual remediation/compensation, Orthotic/Fit training, DME instructions, Wheelchair mobility training, Spinal mobilization, Cryotherapy, Moist heat, Splintting, Taping, and Manual  therapy  PLAN FOR NEXT SESSION: hamtrings loading, hip strength, mobility, endurance, gait and balance   1:52 PM, 12/10/22 Etta Grandchild, PT, DPT Physical Therapist - [place] [phone]     [place] [facility], PT 12/10/2022, 1:13 PM

## 2022-12-15 ENCOUNTER — Encounter: Payer: Self-pay | Admitting: Family Medicine

## 2022-12-15 ENCOUNTER — Ambulatory Visit (INDEPENDENT_AMBULATORY_CARE_PROVIDER_SITE_OTHER): Payer: Medicare PPO | Admitting: Family Medicine

## 2022-12-15 VITALS — BP 120/71 | HR 90 | Temp 97.6°F | Resp 20 | Wt 221.0 lb

## 2022-12-15 DIAGNOSIS — N3090 Cystitis, unspecified without hematuria: Secondary | ICD-10-CM | POA: Diagnosis not present

## 2022-12-15 DIAGNOSIS — R053 Chronic cough: Secondary | ICD-10-CM | POA: Diagnosis not present

## 2022-12-15 DIAGNOSIS — G20A1 Parkinson's disease without dyskinesia, without mention of fluctuations: Secondary | ICD-10-CM

## 2022-12-15 DIAGNOSIS — R350 Frequency of micturition: Secondary | ICD-10-CM

## 2022-12-15 DIAGNOSIS — R7303 Prediabetes: Secondary | ICD-10-CM | POA: Diagnosis not present

## 2022-12-15 LAB — POCT URINALYSIS DIPSTICK
Bilirubin, UA: NEGATIVE
Blood, UA: NEGATIVE
Glucose, UA: NEGATIVE
Ketones, UA: POSITIVE
Nitrite, UA: NEGATIVE
Protein, UA: NEGATIVE
Spec Grav, UA: 1.015 (ref 1.010–1.025)
Urobilinogen, UA: 0.2 E.U./dL
pH, UA: 6 (ref 5.0–8.0)

## 2022-12-15 MED ORDER — CEPHALEXIN 500 MG PO CAPS: 500.0000 mg | ORAL_CAPSULE | Freq: Three times a day (TID) | ORAL | 0 refills | Status: AC

## 2022-12-15 NOTE — Progress Notes (Signed)
I,Sulibeya S Dimas,acting as a Education administrator for Lavon Paganini, MD.,have documented all relevant documentation on the behalf of Lavon Paganini, MD,as directed by  Lavon Paganini, MD while in the presence of Lavon Paganini, MD.     Established patient visit   Patient: Kendra [name]   DOB: 09/13/1946   76 y.o. Female  MRN: [id number] Visit Date: 12/15/2022  Today's healthcare provider: Lavon Paganini, MD   Chief Complaint  Patient presents with   Urinary Frequency   Subjective    HPI  Urinary symptoms  She reports recurrent urinary frequency and urinary urgency. The current episode started a few weeks ago and is gradually worsening. Patient states symptoms are mild in intensity, occurring constantly. She  has not been recently treated for similar symptoms.   +new incontinence during the day - using depends overnight for >1 yr  2 episodes in the last week.   No change in chronic cough with pepcid. Does not feel like she has GERD/reflux  Would benefit from hair washing help 3 times weekly - paying for it 2 times weekly. Looking for service to help with this in the home. Not candidate for [facility] given that she is doing in person PT currently  Medications: Outpatient Medications Prior to Visit  Medication Sig   Carbidopa-Levodopa ER (SINEMET CR) 25-100 MG tablet controlled release Take 1 tablet by mouth at bedtime.   Acetaminophen 500 MG capsule Take 1,000 mg by mouth 2 (two) times daily.   carbidopa-levodopa (SINEMET IR) 25-100 MG tablet Take 1 tablet by mouth daily in the afternoon. In the morning   cyanocobalamin (,VITAMIN B-12,) 1000 MCG/ML injection INJECT 1ML INTO THE MUSCLE EVERY 30 DAYS   denosumab (PROLIA) 60 MG/ML SOSY injection Inject 60 mg into the skin every 6 (six) months. takes annually   FLUoxetine HCl 60 MG TABS Take 1 tablet by mouth daily.   gabapentin (NEURONTIN) 100 MG capsule Take 3 capsules (300 mg total) by mouth 3 (three) times daily. (Patient  taking differently: Take 200 mg by mouth 3 (three) times daily.)   letrozole (FEMARA) 2.5 MG tablet Take 1 tablet (2.5 mg total) by mouth daily.   lisinopril-hydrochlorothiazide (ZESTORETIC) 20-12.5 MG tablet TAKE 1 TABLET BY MOUTH DAILY   nystatin (NYAMYC) powder APPLY TOPICALLY TO AFFECTED AREA(S) THREE TIMES DAILY (Patient taking differently: daily.)   risperiDONE (RISPERDAL) 0.5 MG tablet Take 1 tablet (0.5 mg total) by mouth 2 (two) times daily.   traZODone (DESYREL) 100 MG tablet TAKE ONE TABLET BY MOUTH AT BEDTIME   [DISCONTINUED] diclofenac Sodium (VOLTAREN) 1 % GEL Apply 4 g topically 4 (four) times daily as needed.   [DISCONTINUED] famotidine (PEPCID) 20 MG tablet Take 1 tablet (20 mg total) by mouth 2 (two) times daily.   No facility-administered medications prior to visit.    Review of Systems  Constitutional:  Negative for chills and fever.  Respiratory:  Negative for chest tightness.   Gastrointestinal:  Positive for constipation. Negative for abdominal pain, blood in stool, diarrhea, nausea and vomiting.  Genitourinary:  Positive for frequency and urgency. Negative for hematuria.       Objective    BP 120/71 (BP Location: Left Arm, Patient Position: Sitting, Cuff Size: Large)   Pulse 90   Temp 97.6 F (36.4 C) (Temporal)   Resp 20   Wt 221 lb (100.2 kg)   SpO2 96%   BMI 35.67 kg/m    Physical Exam Vitals reviewed.  Constitutional:      General: She is  not in acute distress.    Appearance: Normal appearance. She is well-developed. She is not diaphoretic.  HENT:     Head: Normocephalic and atraumatic.  Eyes:     General: No scleral icterus.    Conjunctiva/sclera: Conjunctivae normal.  Neck:     Thyroid: No thyromegaly.  Cardiovascular:     Rate and Rhythm: Normal rate and regular rhythm.     Pulses: Normal pulses.     Heart sounds: Normal heart sounds. No murmur heard. Pulmonary:     Effort: Pulmonary effort is normal. No respiratory distress.      Breath sounds: Normal breath sounds. No wheezing, rhonchi or rales.  Musculoskeletal:     Cervical back: Neck supple.     Right lower leg: No edema.     Left lower leg: No edema.  Lymphadenopathy:     Cervical: No cervical adenopathy.  Skin:    General: Skin is warm and dry.     Findings: No rash.  Neurological:     Mental Status: She is alert and oriented to person, place, and time. Mental status is at baseline.  Psychiatric:        Mood and Affect: Mood normal.        Behavior: Behavior normal.       Results for orders placed or performed in visit on 12/15/22  POCT urinalysis dipstick  Result Value Ref Range   Color, UA yellow    Clarity, UA clear    Glucose, UA Negative Negative   Bilirubin, UA Negative    Ketones, UA Positive    Spec Grav, UA 1.015 1.010 - 1.025   Blood, UA Negative    pH, UA 6.0 5.0 - 8.0   Protein, UA Negative Negative   Urobilinogen, UA 0.2 0.2 or 1.0 E.U./dL   Nitrite, UA Negative    Leukocytes, UA Small (1+) (A) Negative    Assessment & Plan     Problem List Items Addressed This Visit       Nervous and Auditory   Parkinson disease (Chronic)    Patient would benefit from additional help at home with ADLs and IADLs      Relevant Medications   Carbidopa-Levodopa ER (SINEMET CR) 25-100 MG tablet controlled release     Other   Chronic cough    Ongoing for 7+ months Unclear etiology  Lungs clear on exam Never smoker No improvement with H2 blocker Trial of flonase      Prediabetes    Discussed low carb diet  Will try to cut back on desserts      Other Visit Diagnoses     Frequent urination    -  Primary   Relevant Orders   POCT urinalysis dipstick (Completed)   Cystitis       Relevant Orders   Urine Culture      - Symptoms and UA consistent with UTI -No systemic symptoms or signs of pyelonephritis -Will start treatment with 5 day course of Keflex after reviewing previous urine culture -We will send urine culture to  confirm sensitivities -Discussed return precautions   No follow-ups on file.      I, Lavon Paganini, MD, have reviewed all documentation for this visit. The documentation on 12/15/22 for the exam, diagnosis, procedures, and orders are all accurate and complete.   Nero Sawatzky, Dionne Bucy, MD, MPH [facility]

## 2022-12-15 NOTE — Assessment & Plan Note (Signed)
Patient would benefit from additional help at home with ADLs and IADLs

## 2022-12-15 NOTE — Assessment & Plan Note (Signed)
Discussed low carb diet  Will try to cut back on desserts

## 2022-12-15 NOTE — Assessment & Plan Note (Signed)
Ongoing for 7+ months Unclear etiology  Lungs clear on exam Never smoker No improvement with H2 blocker Trial of flonase

## 2022-12-17 ENCOUNTER — Ambulatory Visit (HOSPITAL_BASED_OUTPATIENT_CLINIC_OR_DEPARTMENT_OTHER): Payer: Medicare PPO | Admitting: Pain Medicine

## 2022-12-17 DIAGNOSIS — Z91199 Patient's noncompliance with other medical treatment and regimen due to unspecified reason: Secondary | ICD-10-CM | POA: Insufficient documentation

## 2022-12-17 LAB — URINE CULTURE

## 2022-12-17 NOTE — Progress Notes (Signed)
(  12/17/2022) NO-SHOW to requested appointment.  The patient had called and requested an appointment to be evaluated secondary to increased pain.

## 2022-12-22 ENCOUNTER — Ambulatory Visit: Payer: Medicare PPO

## 2022-12-22 DIAGNOSIS — R278 Other lack of coordination: Secondary | ICD-10-CM | POA: Diagnosis not present

## 2022-12-22 DIAGNOSIS — R2681 Unsteadiness on feet: Secondary | ICD-10-CM

## 2022-12-22 DIAGNOSIS — M25552 Pain in left hip: Secondary | ICD-10-CM | POA: Diagnosis not present

## 2022-12-22 DIAGNOSIS — R262 Difficulty in walking, not elsewhere classified: Secondary | ICD-10-CM | POA: Diagnosis not present

## 2022-12-22 DIAGNOSIS — M6281 Muscle weakness (generalized): Secondary | ICD-10-CM

## 2022-12-22 DIAGNOSIS — R42 Dizziness and giddiness: Secondary | ICD-10-CM

## 2022-12-22 NOTE — Therapy (Signed)
OUTPATIENT NEURO PHYSICAL THERAPY TREATMENT   Patient Name: Kendra [name] MRN: [id number] DOB:May 24, 1946, 77 y.o., female Today's Date: 12/22/2022  PCP: Virginia Crews, MD REFERRING PROVIDER: Virginia Crews, MD  END OF SESSION:  PT End of Session - 12/22/22 1659     Visit Number 21    Number of Visits 33    Date for PT Re-Evaluation 12/30/22    Authorization Type humana Medicare auth    Authorization Time Period 11/04/2022-12/30/2022    PT Start Time 1305    PT Stop Time 1345    PT Time Calculation (min) 40 min    Equipment Utilized During Treatment Gait belt    Activity Tolerance Patient tolerated treatment well;Patient limited by fatigue;No increased pain    Behavior During Therapy [facility] for tasks assessed/performed                      Past Medical History:  Diagnosis Date   Anxiety    Breast cancer ([place]) 03/2018   right breast cancer at 11:00 and 1:00   Bursitis    bilateral hips and knees   Complication of anesthesia    Depression    Family history of breast cancer 03/24/2018   Family history of lung cancer 03/24/2018   History of alcohol dependence ([place]) 2007   no ETOH; resolved since 2007   History of hiatal hernia    History of psychosis 2014   due to sleep disturbance   Hypertension    Parkinson disease    Personal history of radiation therapy 06/2018-07/2018   right breast ca   PONV (postoperative nausea and vomiting)    Past Surgical History:  Procedure Laterality Date   BREAST BIOPSY Right 03/14/2018   11:00 DCIS and invasive ductal carcinoma   BREAST BIOPSY Right 03/14/2018   1:00 Invasive ductal carcinoma   BREAST LUMPECTOMY Right 04/27/2018   lumpectomy of 11 and 1:00 cancers, clear margins, negative LN   BREAST LUMPECTOMY WITH RADIOACTIVE SEED AND SENTINEL LYMPH NODE BIOPSY Right 04/27/2018   Procedure: RIGHT BREAST LUMPECTOMY WITH RADIOACTIVE SEED X 2 AND RIGHT SENTINEL LYMPH NODE BIOPSY;  Surgeon: [name], MD;   Location: [place];  Service: General;  Laterality: Right;   CATARACT EXTRACTION W/PHACO Left 08/30/2019   Procedure: CATARACT EXTRACTION PHACO AND INTRAOCULAR LENS PLACEMENT ([place]) LEFT panoptix toric  01:20.5  12.7%  10.26;  Surgeon: Leandrew Koyanagi, MD;  Location: [place];  Service: Ophthalmology;  Laterality: Left;   CATARACT EXTRACTION W/PHACO Right 09/20/2019   Procedure: CATARACT EXTRACTION PHACO AND INTRAOCULAR LENS PLACEMENT ([facility]) RIGHT 5.71  00:36.4  15.7%;  Surgeon: Leandrew Koyanagi, MD;  Location: [place];  Service: Ophthalmology;  Laterality: Right;   LAPAROTOMY N/A 02/10/2018   Procedure: EXPLORATORY LAPAROTOMY;  Surgeon: Isabel Caprice, MD;  Location: [facility];  Service: Gynecology;  Laterality: N/A;   MASS EXCISION  01/2018   abdominal   OVARIAN CYST REMOVAL     SALPINGOOPHORECTOMY Bilateral 02/10/2018   Procedure: BILATERAL SALPINGO OOPHORECTOMY; PERITONEAL WASHINGS;  Surgeon: Isabel Caprice, MD;  Location: [facility];  Service: Gynecology;  Laterality: Bilateral;   TONSILLECTOMY     TONSILLECTOMY AND ADENOIDECTOMY  1953   Patient Active Problem List   Diagnosis Date Noted   Failure to attend appointment 12/17/2022   Prediabetes 12/15/2022   Chronic cough 08/17/2022   History of allergy to shellfish 02/24/2022   Chronic low back pain of over 3 months duration 02/04/2022  Osteoarthritis of  AC (acromioclavicular) joint (Left) 02/04/2022   Osteoarthritis of hip (Right) 02/04/2022   Impaction fracture of distal end of radius and ulna, sequela (Left) 02/04/2022   Osteoarthritis of 1st (thumb) carpometacarpal (CMC) joint of hand (Left) 02/04/2022   Lumbosacral facet arthropathy (Multilevel) (Bilateral) (T12-S1) 02/04/2022   Lumbar central spinal stenosis w/o neurogenic claudication (L2-3, L3-4, L4-5) 02/04/2022   Lumbar foraminal stenosis (Bilateral: L2-3, L4-5) (Right: (Severe) L4-5) 02/04/2022   Lumbar lateral recess stenosis  (Bilateral: L2-3, L3-4, and L4-5) (Severe) 02/04/2022   DDD (degenerative disc disease), lumbar 02/04/2022   Lumbosacral facet syndrome (Multilevel) (Bilateral) 02/04/2022   Osteoarthritis of lumbar spine 02/04/2022   Allergic conjunctivitis of both eyes 12/23/2021   Chronic hip pain (3ry area of Pain) (Bilateral) (L>R) 12/22/2021   Chronic lower extremity pain (2ry area of Pain) (Bilateral) (L>R) 12/22/2021   Abnormal MRI, lumbar spine (01/31/2020) 12/22/2021   Grade 1 Anterolisthesis of lumbar spine (L3/L4) & (5 mm) (L4/L5) 12/22/2021   Chronic pain syndrome 11/17/2021   High risk medication use 11/17/2021   Disorder of skeletal system 11/17/2021   Problems influencing health status 11/17/2021   Other hyperlipidemia 10/21/2021   Polypharmacy 08/12/2021   Chronic low back pain (1ry area of Pain) (Bilateral) (L>R) w/o sciatica 06/18/2021   Osteopenia of neck of femur (Right) 09/06/2020   Obesity 08/29/2020   Parkinson disease 02/22/2020   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   Bipolar disorder, in full remission, most recent episode depressed ([place]) 11/08/2019   Vitamin D insufficiency 08/24/2019   Insomnia due to mental condition 05/31/2019   Bipolar I disorder, most recent episode depressed ([place]) 05/31/2019   Alcohol use disorder, moderate, in sustained remission ([place]) 05/31/2019   Elevated tumor markers 05/27/2019   GAD (generalized anxiety disorder) 04/10/2019   MDD (major depressive disorder), severe ([place]) 01/31/2019   High serum carbohydrate antigen 19-9 (CA19-9) 12/01/2018   Adjustment disorder with anxious mood 11/24/2018   Essential hypertension 11/24/2018   B12 deficiency 10/17/2018   Incisional hernia, without obstruction or gangrene 09/12/2018   Genetic testing 04/07/2018   Family history of breast cancer 03/24/2018   Family history of lung cancer 03/24/2018   Malignant neoplasm of upper-outer quadrant of right breast in female, estrogen receptor positive ([place]) 03/17/2018    Malignant neoplasm of upper-inner quadrant of right breast in female, estrogen receptor positive ([place]) 03/17/2018   History of psychosis 02/07/2018   History of insomnia 02/07/2018    ONSET DATE: Pt diagnosed with PD in 2019  REFERRING DIAG:  G20 (ICD-10-CM) - Parkinson's disease      THERAPY DIAG:  Muscle weakness (generalized)  Difficulty in walking, not elsewhere classified  Unsteadiness on feet  Dizziness and giddiness  Pain in left hip  Rationale for Evaluation and Treatment: Rehabilitation  SUBJECTIVE: Pt spouse reports a different person will be picking up pt for her Thursday appointment and that pt will need to be brought upstairs to main floor to be picked up. Pt reports no hip pain at rest but that she is still having hip pain with activity. They report no other updates.  Pain: no pain currently Pt accompanied by: significant other self  PERTINENT HISTORY:  Pt is a pleasant 77 yo female who was diagnosed with PD in 2019. She is known to clinic and [facility], and was last seen for LSVT BIG Sept-Oct 2022. Pt with chronic pain and has become increasingly sedentary as a result exacerbating mobility difficulties due to PD. Current concerns: Pt reports L and R hip pain and back pain. Pt was being seen by pain management clinic. Pt has had injections that provided relief at the time. Pt's spouse reports her physician determined surgery was not an option for her pain so the plan is to manage it. Pt spouse thinks pain has resulted in reluctance for pt to move. Pt currently sedentary, doesn't walk much, but when she does she holds on to furniture for support due to balance problems. When pt first stands she must hold on to something due to mix of pain and/or dizziness (described as lightheadedness). Her pain can go from 3/10 to 9/10. It does improve once she starts moving. Pt has increased difficulty with multiple  activities including: getting in and out of her bed, getting in and out of her car, freezing, decreased gait speed, decreased balance and cognition, and difficulty using BUE due to tremors.  Per chart other PMH includes hx of breast cancer, HTN, MCI, insomnia, cataracts, bipolar 1, alcohol use disorder, GAD, MDD, osteopenia of neck of R femur, chronic B low back pain with radiating pain to buttock and posterior thighs and calves. Pt with multiple comorbidities, please refer to chart for full details    PAIN: from eval Are you having pain? Yes: NPRS scale: not rated currently but can reach 9/10 Pain location: L hip and L leg Pain description: pt has difficulty describing Aggravating factors: unsure Relieving factors: laying in the recliner helps, movement  PRECAUTIONS: Fall  WEIGHT BEARING RESTRICTIONS: No  FALLS: Has patient fallen in last 6 months? Yes. Number of falls 1x, leg tangled in chair leg when getting up from a chair  PLOF: Independent with basic ADLs  PATIENT GOALS: improve mobility   BLE Strength Assessment      Right  Left  Hip Flexion 5/5 4/5 (painful)  Hip Horizontal ABDCT  5/5 5/5  Hip Horizontal ADD 4+/5 4+/5  Hip IR (seated)  4+/5 4+/5  Hip ER (seated) 5/5 5/5  Knee Extension 5/5 5/5  Knee Flexion (seated) 4+/5  5/5 (not painful)  Hip ABDCT (supine)  3+/5 4-/5   Focal popliteal edema Left knee, not painful   ROM Assessment Hip     Right  Left   Flexion Supine 132 120 (ouch)  External rotation (90/90) 30 47  Internal Rotation (90/90) 40  20  Supine Hip Extension P/ROM >0 degrees >0 degrees       PATIENT EDUCATION:  Education details: Pt educated throughout session about proper posture and technique with exercises. Improved exercise technique, movement at target joints, use of target muscles after min to mod verbal, visual, tactile cues. LSVT BIG interventions Person educated: Patient and Spouse Education method: Explanation, Demonstration, and Verbal  cues Education comprehension: verbalized understanding, returned demonstration, verbal cues required, tactile cues required, and needs further education   TODAY'S TREATMENT:  DATE: 12/22/22   Nustep Interval/endurance training, cuing for SPM of at least 60, pt maintains in 50s  Lvl 1 x 1 min Lvl 2 x 1 min Lvl 3 x 3 minutes  Pt rates as difficult  Gait for endurance with 4WW, close CGA 2x148 ft. Pt requires rest break due to dizziness. Finds it hard to describe. Pt also reports pain felt above L eye. PT informs pt's spouse of L eye pain. Vitals assessed following gait, seated, LUE: 160/79 mmHg, 79 bpm  Reassessed after another 2 minutes of rest: 136/77 mmHg, 75 bpm  STS 10x. Pt with no increase in pain  VORx1, seated, vertical and horizontal head turns 2x30 sec for each. Cuing throughout for technique. Mild increase in dizziness, improves with rest  STS 10x.   Seated hamstring stretch 2x30 sec BLE   STS 10x   Seated figure-four stretch 2x30 sec BLE (PT assists with positioning). Pt reports increased sense of tightness LLE>RLE.      HOME EXERCISE PROGRAM:   Access Code: [email or web address] URL: [email or web address] Date: 12/22/2022 Prepared by: Ricard Dillon  Exercises - Sit to Stand with Counter Support  - 1 x daily - 5 x weekly - 3 sets - 10 reps  GOALS: Goals reviewed with patient? Yes  SHORT TERM GOALS: Target date: 10/27/2022    Patient will be independent in home exercise program to improve strength/mobility for better functional independence with ADLs. Baseline: LSVT BIG protocol to be initiated next session; 12/18: not yet fully indep, reports she still gets confused about the technique; 12/03/2022: HEP to be updated Goal status: IN PROGRESS   LONG TERM GOALS: Target date: 11/11/2022   Patient will increase FOTO score  to equal to or greater than 49 to demonstrate statistically significant improvement in mobility and quality of life.  Baseline: 40; 12/18: deferred due to time; 45  Goal status: PARTIALLY MET  2.  Patient (> 39 years old) will complete five times sit to stand test in < 15 seconds indicating decreased fall risk.  Baseline: 27 sec; 12/18 23 sec hands-free; 11/04/22: 24 sec hands free; 2/1/124: 22.7 sec use of BUE, 21.3 sec hands-free Goal status: IN PROGRESS  3.  Patient will increase 10 meter walk test to >1.51ms with WFL B step-length, upright posture for >90% of test to improve ease with gait and to override hypokentic and bradykinetic movement.  Baseline: 0.58 m/s with [id number] 12/18: 0.7 m/s with [id number] 11/04/22: 0.69 m/s with [id number]  12/03/22: 0.73 m/s with 4WW Goal status: IN PROGRESS  4.  Patient will reduce time to don jacket by at least 5 seconds in order to increase ease with dressing and override bradykinetic movement Baseline: 17 seconds; 12/18: 12 seconds Goal status: MET  5.  Pt will demo or report ability to open standard jar using only 1-2 attempts in order to exhibit improved functional mobility for ADLs.  Baseline: 5.7 seconds with multiple attempts to turn jar lid to open; 12/18: 2.8 sec opens in 2 turns Goal status: MET  6.  Patient will increase Berg Balance score by > 6 points to demonstrate decreased fall risk during functional activities. Baseline: to be tested next 1-2 visits; 12/03/2022: 44/56  Goal status: NEW  7.  Patient will report at least 4 days in a row with minimal to no L hip pain in order to improve QOL and ease/safety with functional mobility. Baseline: Pt reports pain majority of days in L hip; 12/03/2022: pt reports having hip pain about every day  Goal status: NEW  ASSESSMENT:  CLINICAL IMPRESSION: Continued endurance training and LE strengthening. Provided pt with HEP focusing on STS to promote LE strength and functional mobility. Pt also initiated stretching of  BLE today. Pt with difficulty performing seated figure-four stretch due to increased sense of tightness/stiffness. Will continue to perform stretching interventions to address this deficit. Pt generally tolerated interventions well with no increase in hip or back pain. The pt will benefit from further skilled PT to improve strength, balance, gait and mobility.  OBJECTIVE IMPAIRMENTS: Abnormal gait, decreased balance, decreased cognition, decreased coordination, decreased endurance, decreased knowledge of use of DME, decreased mobility, difficulty walking, decreased strength, dizziness, hypomobility, impaired UE functional use, improper body mechanics, postural dysfunction, and pain.   ACTIVITY LIMITATIONS: lifting, bending, standing, squatting, stairs, transfers, bed mobility, bathing, dressing, hygiene/grooming, and locomotion level  PARTICIPATION LIMITATIONS: meal prep, cleaning, laundry, medication management, personal finances, driving, shopping, community activity, and yard work  PERSONAL FACTORS: Age, Fitness, Sex, Time since onset of injury/illness/exacerbation, and 3+ comorbidities: PMH includes hx of breast cancer, HTN, MCI, insomnia, cataracts, bipolar 1, alcohol use disorder, GAD, MDD, osteopenia of neck of R femur, chronic B low back pain with radiating pain to buttock and posterior thighs and calves. Pt with multiple comorbidities, please refer to chart for full details  are also affecting patient's functional outcome.   REHAB POTENTIAL: Fair    CLINICAL DECISION MAKING: Evolving/moderate complexity  EVALUATION COMPLEXITY: High  PLAN:  PT FREQUENCY: 2x/week  PT DURATION: 8 weeks  PLANNED INTERVENTIONS: Therapeutic exercises, Therapeutic activity, Neuromuscular re-education, Balance training, Gait training, Patient/Family education, Self Care, Joint mobilization, Stair training, Vestibular training, Canalith repositioning, Visual/preceptual remediation/compensation, Orthotic/Fit  training, DME instructions, Wheelchair mobility training, Spinal mobilization, Cryotherapy, Moist heat, Splintting, Taping, and Manual therapy  PLAN FOR NEXT SESSION: hamtrings loading, hip strength, mobility, endurance, gait and balance   5:10 PM, 12/22/22  Physical Therapist - [place], PT 12/22/2022, 5:10 PM

## 2022-12-24 ENCOUNTER — Ambulatory Visit: Payer: Medicare PPO

## 2022-12-24 DIAGNOSIS — R262 Difficulty in walking, not elsewhere classified: Secondary | ICD-10-CM

## 2022-12-24 DIAGNOSIS — M6281 Muscle weakness (generalized): Secondary | ICD-10-CM | POA: Diagnosis not present

## 2022-12-24 DIAGNOSIS — R42 Dizziness and giddiness: Secondary | ICD-10-CM

## 2022-12-24 DIAGNOSIS — R2681 Unsteadiness on feet: Secondary | ICD-10-CM | POA: Diagnosis not present

## 2022-12-24 DIAGNOSIS — R278 Other lack of coordination: Secondary | ICD-10-CM | POA: Diagnosis not present

## 2022-12-24 DIAGNOSIS — M25552 Pain in left hip: Secondary | ICD-10-CM

## 2022-12-24 NOTE — Therapy (Signed)
OUTPATIENT NEURO PHYSICAL THERAPY TREATMENT   Patient Name: Kendra [name] MRN: [id number] DOB:16-Feb-1946, 77 y.o., female Today's Date: 12/24/2022  PCP: Virginia Crews, MD REFERRING PROVIDER: Virginia Crews, MD  END OF SESSION:  PT End of Session - 12/24/22 1309     Visit Number 22    Number of Visits 33    Date for PT Re-Evaluation 12/30/22    Authorization Type [employer] Choice PPO    Authorization Time Period 11/04/2022-12/30/2022    Progress Note Due on Visit 30    Equipment Utilized During Treatment Gait belt    Activity Tolerance Patient tolerated treatment well    Behavior During Therapy [facility] for tasks assessed/performed                      Past Medical History:  Diagnosis Date   Anxiety    Breast cancer ([place]) 03/2018   right breast cancer at 11:00 and 1:00   Bursitis    bilateral hips and knees   Complication of anesthesia    Depression    Family history of breast cancer 03/24/2018   Family history of lung cancer 03/24/2018   History of alcohol dependence ([place]) 2007   no ETOH; resolved since 2007   History of hiatal hernia    History of psychosis 2014   due to sleep disturbance   Hypertension    Parkinson disease    Personal history of radiation therapy 06/2018-07/2018   right breast ca   PONV (postoperative nausea and vomiting)    Past Surgical History:  Procedure Laterality Date   BREAST BIOPSY Right 03/14/2018   11:00 DCIS and invasive ductal carcinoma   BREAST BIOPSY Right 03/14/2018   1:00 Invasive ductal carcinoma   BREAST LUMPECTOMY Right 04/27/2018   lumpectomy of 11 and 1:00 cancers, clear margins, negative LN   BREAST LUMPECTOMY WITH RADIOACTIVE SEED AND SENTINEL LYMPH NODE BIOPSY Right 04/27/2018   Procedure: RIGHT BREAST LUMPECTOMY WITH RADIOACTIVE SEED X 2 AND RIGHT SENTINEL LYMPH NODE BIOPSY;  Surgeon: [name], MD;  Location: [place];  Service: General;  Laterality: Right;   CATARACT  EXTRACTION W/PHACO Left 08/30/2019   Procedure: CATARACT EXTRACTION PHACO AND INTRAOCULAR LENS PLACEMENT ([place]) LEFT panoptix toric  01:20.5  12.7%  10.26;  Surgeon: Leandrew Koyanagi, MD;  Location: [place];  Service: Ophthalmology;  Laterality: Left;   CATARACT EXTRACTION W/PHACO Right 09/20/2019   Procedure: CATARACT EXTRACTION PHACO AND INTRAOCULAR LENS PLACEMENT ([facility]) RIGHT 5.71  00:36.4  15.7%;  Surgeon: Leandrew Koyanagi, MD;  Location: [place];  Service: Ophthalmology;  Laterality: Right;   LAPAROTOMY N/A 02/10/2018   Procedure: EXPLORATORY LAPAROTOMY;  Surgeon: Isabel Caprice, MD;  Location: [facility];  Service: Gynecology;  Laterality: N/A;   MASS EXCISION  01/2018   abdominal   OVARIAN CYST REMOVAL     SALPINGOOPHORECTOMY Bilateral 02/10/2018   Procedure: BILATERAL SALPINGO OOPHORECTOMY; PERITONEAL WASHINGS;  Surgeon: Isabel Caprice, MD;  Location: [facility];  Service: Gynecology;  Laterality: Bilateral;   TONSILLECTOMY     TONSILLECTOMY AND ADENOIDECTOMY  1953   Patient Active Problem List   Diagnosis Date Noted   Failure to attend appointment 12/17/2022   Prediabetes 12/15/2022   Chronic cough 08/17/2022   History of allergy to shellfish 02/24/2022   Chronic low back pain of over 3 months duration 02/04/2022   Osteoarthritis of  AC (acromioclavicular) joint (Left) 02/04/2022   Osteoarthritis of hip (Right) 02/04/2022   Impaction  fracture of distal end of radius and ulna, sequela (Left) 02/04/2022   Osteoarthritis of 1st (thumb) carpometacarpal [facility]) joint of hand (Left) 02/04/2022   Lumbosacral facet arthropathy (Multilevel) (Bilateral) (T12-S1) 02/04/2022   Lumbar central spinal stenosis w/o neurogenic claudication (L2-3, L3-4, L4-5) 02/04/2022   Lumbar foraminal stenosis (Bilateral: L2-3, L4-5) (Right: (Severe) L4-5) 02/04/2022   Lumbar lateral recess stenosis (Bilateral: L2-3, L3-4, and L4-5) (Severe) 02/04/2022   DDD (degenerative disc disease),  lumbar 02/04/2022   Lumbosacral facet syndrome (Multilevel) (Bilateral) 02/04/2022   Osteoarthritis of lumbar spine 02/04/2022   Allergic conjunctivitis of both eyes 12/23/2021   Chronic hip pain (3ry area of Pain) (Bilateral) (L>R) 12/22/2021   Chronic lower extremity pain (2ry area of Pain) (Bilateral) (L>R) 12/22/2021   Abnormal MRI, lumbar spine (01/31/2020) 12/22/2021   Grade 1 Anterolisthesis of lumbar spine (L3/L4) & (5 mm) (L4/L5) 12/22/2021   Chronic pain syndrome 11/17/2021   High risk medication use 11/17/2021   Disorder of skeletal system 11/17/2021   Problems influencing health status 11/17/2021   Other hyperlipidemia 10/21/2021   Polypharmacy 08/12/2021   Chronic low back pain (1ry area of Pain) (Bilateral) (L>R) w/o sciatica 06/18/2021   Osteopenia of neck of femur (Right) 09/06/2020   Obesity 08/29/2020   Parkinson disease 02/22/2020   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   Bipolar disorder, in full remission, most recent episode depressed ([place]) 11/08/2019   Vitamin D insufficiency 08/24/2019   Insomnia due to mental condition 05/31/2019   Bipolar I disorder, most recent episode depressed ([place]) 05/31/2019   Alcohol use disorder, moderate, in sustained remission ([place]) 05/31/2019   Elevated tumor markers 05/27/2019   GAD (generalized anxiety disorder) 04/10/2019   MDD (major depressive disorder), severe ([place]) 01/31/2019   High serum carbohydrate antigen 19-9 (CA19-9) 12/01/2018   Adjustment disorder with anxious mood 11/24/2018   Essential hypertension 11/24/2018   B12 deficiency 10/17/2018   Incisional hernia, without obstruction or gangrene 09/12/2018   Genetic testing 04/07/2018   Family history of breast cancer 03/24/2018   Family history of lung cancer 03/24/2018   Malignant neoplasm of upper-outer quadrant of right breast in female, estrogen receptor positive ([place]) 03/17/2018   Malignant neoplasm of upper-inner quadrant of right breast in female, estrogen receptor  positive ([place]) 03/17/2018   History of psychosis 02/07/2018   History of insomnia 02/07/2018    ONSET DATE: Pt diagnosed with PD in 2019  REFERRING DIAG:  G20 (ICD-10-CM) - Parkinson's disease      THERAPY DIAG:  Muscle weakness (generalized)  Difficulty in walking, not elsewhere classified  Unsteadiness on feet  Dizziness and giddiness  Pain in left hip  Rationale for Evaluation and Treatment: Rehabilitation  SUBJECTIVE: Pt has no pertinent updates since previous visit. Feels like she is making progress regarding her hip pain.   PERTINENT HISTORY:  Pt is a pleasant 77 yo female who was diagnosed with PD in 2019. She is known to clinic and [facility], and was last seen for LSVT BIG Sept-Oct 2022. Pt with chronic pain and has become increasingly sedentary as a result exacerbating mobility difficulties due to PD. Current concerns: Pt reports L and R hip pain and back pain. Pt was being seen by pain management clinic. Pt has had injections that provided relief at the time. Pt's spouse reports her physician determined surgery was not an option for her pain so the plan is to manage it. Pt spouse thinks pain has resulted in reluctance for pt to move. Pt currently sedentary, doesn't walk much, but  when she does she holds on to furniture for support due to balance problems. When pt first stands she must hold on to something due to mix of pain and/or dizziness (described as lightheadedness). Her pain can go from 3/10 to 9/10. It does improve once she starts moving. Pt has increased difficulty with multiple activities including: getting in and out of her bed, getting in and out of her car, freezing, decreased gait speed, decreased balance and cognition, and difficulty using BUE due to tremors.  Per chart other PMH includes hx of breast cancer, HTN, MCI, insomnia, cataracts, bipolar 1, alcohol use disorder, GAD, MDD, osteopenia of neck of R femur, chronic B low back pain with radiating pain to buttock  and posterior thighs and calves. Pt with multiple comorbidities, please refer to chart for full details   PAIN:  No pain on arrival in hip, slight left parietal headache  PRECAUTIONS: Fall  WEIGHT BEARING RESTRICTIONS: No  FALLS: Has patient fallen in last 6 months? Yes. Number of falls 1x, leg tangled in chair leg when getting up from a chair  PLOF: Independent with basic ADLs  PATIENT GOALS: improve mobility   INTERVENTION THIS DATE:  NUSTEP: seat 8, arms 10, level 1-4 progressive increase (5 minutes Post Nustep vitals seated:  LUE brachia, normal adult cuff 132/102 mmHg, 77bpm,   -AMB overground RW 152f, tired   -STS from plinth, hands free 1x10  -seated marching, 5lb AW 20x alternating -LAQ 1x10 bilat 5lb AW  -seated redTB clam 1x20, bolster at feet for hip joint opening  -seated hamstrings flexion rd TB 1x15 bilat   -STS from plinth, hands free 1x10  -853ftoss/catch green phsyio ball 1x10 (big, PWR move!)  -seated marching, 5lb AW 20x alternating -LAQ 1x10 bilat 5lb AW  -64f41foss/catch green phsyio ball 1x10 (big, PWR move!)  -seated GreenTB clam 1x15, bolster at feet for hip joint opening  -64ft70fss/catch green phsyio ball 1x10 (big, PWR move!)  -seated hamstrings flexion double blueTB 1x15 bilat   PATIENT EDUCATION:  Cues for smooth continuous movements for harder resistance exercises. Continued to promote self counting of reps as a means to promote remediation of cognitive impairments to attention and memory.                                                                                                                                        HOME EXERCISE PROGRAM:  Access Code: [email or web address]: [email or web address] Date: 12/22/2022 Prepared by: HaleRicard Dillonercises - Sit to Stand with Counter Support  - 1 x daily - 5 x weekly - 3 sets - 10 reps  GOALS: Goals reviewed with patient? Yes  SHORT TERM GOALS: Target date:  10/27/2022    Patient will be independent in home exercise program to improve strength/mobility for better functional independence with ADLs. Baseline: LSVT BIG protocol to be initiated next session; 12/18: not yet  fully indep, reports she still gets confused about the technique; 12/03/2022: HEP to be updated Goal status: IN PROGRESS   LONG TERM GOALS: Target date: 11/11/2022   Patient will increase FOTO score to equal to or greater than 49 to demonstrate statistically significant improvement in mobility and quality of life.  Baseline: 40; 12/18: deferred due to time; 45  Goal status: PARTIALLY MET  2.  Patient (> 13 years old) will complete five times sit to stand test in < 15 seconds indicating decreased fall risk.  Baseline: 27 sec; 12/18 23 sec hands-free; 11/04/22: 24 sec hands free; 2/1/124: 22.7 sec use of BUE, 21.3 sec hands-free Goal status: IN PROGRESS  3.  Patient will increase 10 meter walk test to >1.56ms with WFL B step-length, upright posture for >90% of test to improve ease with gait and to override hypokentic and bradykinetic movement.  Baseline: 0.58 m/s with [id number] 12/18: 0.7 m/s with [id number] 11/04/22: 0.69 m/s with [id number]  12/03/22: 0.73 m/s with 4WW Goal status: IN PROGRESS  4.  Patient will reduce time to don jacket by at least 5 seconds in order to increase ease with dressing and override bradykinetic movement Baseline: 17 seconds; 12/18: 12 seconds Goal status: MET  5.  Pt will demo or report ability to open standard jar using only 1-2 attempts in order to exhibit improved functional mobility for ADLs.  Baseline: 5.7 seconds with multiple attempts to turn jar lid to open; 12/18: 2.8 sec opens in 2 turns Goal status: MET  6.  Patient will increase Berg Balance score by > 6 points to demonstrate decreased fall risk during functional activities. Baseline: to be tested next 1-2 visits; 12/03/2022: 44/56  Goal status: NEW  7.  Patient will report at least 4 days in a row with  minimal to no L hip pain in order to improve QOL and ease/safety with functional mobility. Baseline: Pt reports pain majority of days in L hip; 12/03/2022: pt reports having hip pain about every day Goal status: NEW  ASSESSMENT:  CLINICAL IMPRESSION: Continued work toward goals of care. Pt tolerates session well without concerning LOB, fatigue, pain. Pt remains motivated to meet her goals, feels that she is making progress overall. Proceeded with BLE strength goals while intertwining some large amplitude, explosive movements. Also integrated some continuous aerobic and AMB activity. No dizziness in session.   OBJECTIVE IMPAIRMENTS: Abnormal gait, decreased balance, decreased cognition, decreased coordination, decreased endurance, decreased knowledge of use of DME, decreased mobility, difficulty walking, decreased strength, dizziness, hypomobility, impaired UE functional use, improper body mechanics, postural dysfunction, and pain.   ACTIVITY LIMITATIONS: lifting, bending, standing, squatting, stairs, transfers, bed mobility, bathing, dressing, hygiene/grooming, and locomotion level  PARTICIPATION LIMITATIONS: meal prep, cleaning, laundry, medication management, personal finances, driving, shopping, community activity, and yard work  PERSONAL FACTORS: Age, Fitness, Sex, Time since onset of injury/illness/exacerbation, and 3+ comorbidities: PMH includes hx of breast cancer, HTN, MCI, insomnia, cataracts, bipolar 1, alcohol use disorder, GAD, MDD, osteopenia of neck of R femur, chronic B low back pain with radiating pain to buttock and posterior thighs and calves. Pt with multiple comorbidities, please refer to chart for full details  are also affecting patient's functional outcome.   REHAB POTENTIAL: Fair    CLINICAL DECISION MAKING: Evolving/moderate complexity  EVALUATION COMPLEXITY: High  PLAN:  PT FREQUENCY: 2x/week  PT DURATION: 8 weeks  PLANNED INTERVENTIONS: Therapeutic exercises,  Therapeutic activity, Neuromuscular re-education, Balance training, Gait training, Patient/Family education, Self Care, Joint mobilization, Stair training,  Vestibular training, Canalith repositioning, Visual/preceptual remediation/compensation, Orthotic/Fit training, DME instructions, Wheelchair mobility training, Spinal mobilization, Cryotherapy, Moist heat, Splintting, Taping, and Manual therapy  PLAN FOR NEXT SESSION: hamtrings loading, hip strength, mobility, endurance, gait and balance    Mihran Lebarron C, PT 12/24/2022, 1:11 PM   2:03 PM, 12/24/22 Etta Grandchild, PT, DPT Physical Therapist - [place] [phone]

## 2022-12-31 ENCOUNTER — Ambulatory Visit: Payer: Medicare PPO | Admitting: Pain Medicine

## 2022-12-31 NOTE — Progress Notes (Signed)
PROVIDER NOTE: Information contained herein reflects review and annotations entered in association with encounter. Interpretation of such information and data should be left to medically-trained personnel. Information provided to patient can be located elsewhere in the medical record under "Patient Instructions". Document created using STT-dictation technology, any transcriptional errors that may result from process are unintentional.    Patient: Kendra [name]  Service Category: E/M  Provider: Gaspar Cola, MD  DOB: [birth year]  DOS: 01/04/2023  Referring Provider: Virginia Crews, MD  MRN: [id number]  Specialty: Interventional Pain Management  PCP: Virginia Crews, MD  Type: Established Patient  Setting: Ambulatory outpatient    Location: Office  Delivery: Face-to-face     HPI  Kendra [name], a 77 y.o. year old female, is here today because of her Chronic hip pain, bilateral [M25.551, M25.552, G89.29]. Kendra [name] primary complain today is Hip Pain (left) Last encounter: My last encounter with her was on 12/17/2022. Pertinent problems: Kendra [name] has Malignant neoplasm of upper-outer quadrant of right breast in female, estrogen receptor positive ([place]); Malignant neoplasm of upper-inner quadrant of right breast in female, estrogen receptor positive ([place]); Mucinous cystadenoma; Parkinson disease; Osteopenia of neck of femur (Right); Chronic low back pain (1ry area of Pain) (Bilateral) (L>R) w/o sciatica; Chronic pain syndrome; Chronic hip pain (3ry area of Pain) (Bilateral) (L>R); Chronic lower extremity pain (2ry area of Pain) (Bilateral) (L>R); Abnormal MRI, lumbar spine (01/31/2020); Grade 1 Anterolisthesis of lumbar spine (L3/L4) & (5 mm) (L4/L5); Chronic low back pain of over 3 months duration; Osteoarthritis of  AC (acromioclavicular) joint (Left); Osteoarthritis of hip (Right); Impaction fracture of distal end of radius and ulna, sequela (Left); Osteoarthritis of 1st (thumb)  carpometacarpal (CMC) joint of hand (Left); Lumbosacral facet arthropathy (Multilevel) (Bilateral) (T12-S1); Lumbar central spinal stenosis w/o neurogenic claudication (L2-3, L3-4, L4-5); Lumbar foraminal stenosis (Bilateral: L2-3, L4-5) (Right: (Severe) L4-5); Lumbar lateral recess stenosis (Bilateral: L2-3, L3-4, and L4-5) (Severe); DDD (degenerative disc disease), lumbar; Lumbosacral facet syndrome (Multilevel) (Bilateral); and Osteoarthritis of lumbar spine on their pertinent problem list. Pain Assessment: Severity of Chronic pain is reported as a 7 /10. Location: Hip Left/Denies. Onset: More than a month ago. Quality: Constant, Aching, Discomfort, Throbbing. Timing: Constant. Modifying factor(s): Meds, laying down. Vitals:  height is '5\' 6"'$  (1.676 m) and weight is 226 lb (102.5 kg). Her temperature is 97.4 F (36.3 C) (abnormal). Her blood pressure is 166/67 (abnormal) and her pulse is 75. Her oxygen saturation is 96%.  BMI: Estimated body mass index is 36.48 kg/m as calculated from the following:   Height as of this encounter: '5\' 6"'$  (1.676 m).   Weight as of this encounter: 226 lb (102.5 kg).  Reason for encounter: evaluation of worsening, or previously known (established) problem.  The patient returns today after last time being seen on 12/17/2022.  She refers having increase pain in the left hip area.  She describes the pain as starting in the left buttocks/lower back area and then traveling down the posterolateral aspect of the leg to about mid thigh.  She denies any pain, numbness, or weakness of the lower extremities.  She does have a certain degree of cognitive impairment and therefore she is being assisted by her husband who is her caregiver.  They state that she has been having some of this pain since January.  The last available x-ray of the left hip was done on 11/28/2019 and it came back within normal limits.  X-rays of the lumbar spine do show problems  with advanced degenerative disc  disease and severe L2-3 and L3-4 spinal canal stenosis with multilevel bilateral foraminal stenosis, moderate to severe on the right side at the L4-5 level and moderate on the left at the L2-3 level.  Previously we had done a left-sided L2-3 LESI which did provide the patient with 100% relief of the pain for the duration of the local anesthetic followed by an ongoing 75% improvement by the time she came back for evaluation.  She also has radiological evidence of a grade 1 anterolisthesis of L3 over L4 as well has a grade 1 anterolisthesis of L4 over L5.  Today we will be repeating that lumbar MRI with flexion and extension to follow-up on these problems.  During today's physical exam, the patient seem to have an ongoing Parenthesis provocative Lumbar hyperextension and rotation/Kemp maneuver but she had a positive left-sided reproduction of her pain by attempting lateral bending of the lumbar spine suggestive of foraminal stenosis on the left side.  According to the patient it exactly reproduce her pain.  This would suggest a certain degree of possible instability of her lumbar spine.  For this reason today I have ordered the flexion and extension x-rays.  Provocative Patrick maneuver was negative on the right side and on the left side seem to trigger some discomfort in the left buttocks area.  She also seem to have some decreased range of motion of the left hip joint.  For this reason today we will also order follow-up x-rays of the left hip joint.  The plan was shared with the patient and her husband who understood and accepted.  Lumbar MRI (01/31/20) IMPRESSION: 1. Advanced multilevel degenerative changes of the lumbar spine as described above, with severe L2-3 and L3-4 canal stenosis. 2. Multilevel bilateral foraminal stenosis, moderate-to-severe on the right at L4-5 and moderate on the left at L2-3.  Pharmacotherapy Assessment  Analgesic: Tramadol 50 mg tablet, 1 tab p.o. twice daily MME/day: 10  mg/day   Monitoring: [place] PMP: PDMP reviewed during this encounter.       Pharmacotherapy: No side-effects or adverse reactions reported. Compliance: No problems identified. Effectiveness: Clinically acceptable.  No notes on file  No results found for: "CBDTHCR" No results found for: "D8THCCBX" No results found for: "D9THCCBX"  UDS:  Summary  Date Value Ref Range Status  12/22/2021 Note  Final    Comment:    ==================================================================== Compliance Drug Analysis, Ur ==================================================================== Test                             Result       Flag       Units  Drug Present and Declared for Prescription Verification   Tramadol                       >5155        EXPECTED   ng/mg creat   O-Desmethyltramadol            1528         EXPECTED   ng/mg creat   N-Desmethyltramadol            4768         EXPECTED   ng/mg creat    Source of tramadol is a prescription medication. O-desmethyltramadol    and N-desmethyltramadol are expected metabolites of tramadol.    Gabapentin  PRESENT      EXPECTED   Fluoxetine                     PRESENT      EXPECTED   Norfluoxetine                  PRESENT      EXPECTED    Norfluoxetine is an expected metabolite of fluoxetine.    Trazodone                      PRESENT      EXPECTED   1,3 chlorophenyl piperazine    PRESENT      EXPECTED    1,3-chlorophenyl piperazine is an expected metabolite of trazodone.    Risperidone                    PRESENT      EXPECTED  Drug Absent but Declared for Prescription Verification   Acetaminophen                  Not Detected UNEXPECTED    Acetaminophen, as indicated in the declared medication list, is not    always detected even when used as directed.    Diclofenac                     Not Detected UNEXPECTED    Diclofenac, as indicated in the declared medication list, is not    always detected even when used as  directed.  ==================================================================== Test                      Result    Flag   Units      Ref Range   Creatinine              97               mg/dL      >=20 ==================================================================== Declared Medications:  The flagging and interpretation on this report are based on the  following declared medications.  Unexpected results may arise from  inaccuracies in the declared medications.   **Note: The testing scope of this panel includes these medications:   Fluoxetine  Gabapentin (Neurontin)  Risperidone (Risperdal)  Tramadol (Ultram)  Trazodone (Desyrel)   **Note: The testing scope of this panel does not include small to  moderate amounts of these reported medications:   Acetaminophen  Diclofenac (Voltaren)   **Note: The testing scope of this panel does not include the  following reported medications:   Carbidopa (Sinemet)  Denosumab (Prolia)  Hydrochlorothiazide (Zestoretic)  Letrozole (Femara)  Levodopa (Sinemet)  Lisinopril (Zestoretic)  Meloxicam (Mobic)  Nystatin  Polyethylene Glycol  Vitamin B12 ==================================================================== For clinical consultation, please call [phone]. ====================================================================       ROS  Constitutional: Denies any fever or chills Gastrointestinal: No reported hemesis, hematochezia, vomiting, or acute GI distress Musculoskeletal: Denies any acute onset joint swelling, redness, loss of ROM, or weakness Neurological: No reported episodes of acute onset apraxia, aphasia, dysarthria, agnosia, amnesia, paralysis, loss of coordination, or loss of consciousness  Medication Review  Acetaminophen, Carbidopa-Levodopa ER, FLUoxetine HCl, carbidopa-levodopa, cyanocobalamin, denosumab, gabapentin, letrozole, lisinopril-hydrochlorothiazide, risperiDONE, and traZODone  History Review   Allergy: Kendra [name] is allergic to hydroxyzine, other, and shrimp [shellfish allergy]. Drug: Kendra [name]  reports no history of drug use. Alcohol:  reports that she does not currently use alcohol. Tobacco:  reports  that she has never smoked. She has never used smokeless tobacco. Social: Kendra [name]  reports that she has never smoked. She has never used smokeless tobacco. She reports that she does not currently use alcohol. She reports that she does not use drugs. Medical:  has a past medical history of Anxiety, Breast cancer ([place]) (03/2018), Bursitis, Complication of anesthesia, Depression, Family history of breast cancer (03/24/2018), Family history of lung cancer (03/24/2018), History of alcohol dependence ([place]) (2007), History of hiatal hernia, History of psychosis (2014), Hypertension, Parkinson disease, Personal history of radiation therapy (06/2018-07/2018), and PONV (postoperative nausea and vomiting). Surgical: Kendra [name]  has a past surgical history that includes Ovarian cyst removal; Tonsillectomy and adenoidectomy (1953); Tonsillectomy; laparotomy (N/A, 02/10/2018); Salpingoophorectomy (Bilateral, 02/10/2018); Mass excision (01/2018); Breast lumpectomy with radioactive seed and sentinel lymph node biopsy (Right, 04/27/2018); Cataract extraction w/PHACO (Left, 08/30/2019); Cataract extraction w/PHACO (Right, 09/20/2019); Breast biopsy (Right, 03/14/2018); Breast biopsy (Right, 03/14/2018); and Breast lumpectomy (Right, 04/27/2018). Family: family history includes Breast cancer (age of onset: 72) in her cousin; Breast cancer (age of onset: 42) in her mother; Diabetes in her mother; Heart disease in her paternal grandfather; Heart disease (age of onset: 30) in her brother; Heart disease (age of onset: 45) in her paternal grandmother; Hypertension in her mother; Lung cancer in her father.  Laboratory Chemistry Profile   Renal Lab Results  Component Value Date   BUN 9 11/06/2022   CREATININE  0.75 11/06/2022   BCR 12 11/06/2022   GFRAA >60 04/29/2020   GFRNONAA >60 08/28/2022    Hepatic Lab Results  Component Value Date   AST 17 11/06/2022   ALT 26 11/06/2022   ALBUMIN 4.4 11/06/2022   ALKPHOS 37 (L) 11/06/2022   HCVAB <0.1 06/01/2019    Electrolytes Lab Results  Component Value Date   NA 138 11/06/2022   K 4.0 11/06/2022   CL 98 11/06/2022   CALCIUM 9.6 11/06/2022   MG 2.0 12/22/2021    Bone Lab Results  Component Value Date   VD25OH 15.6 (L) 11/06/2022   TESTOSTERONE 39 06/20/2015    Inflammation (CRP: Acute Phase) (ESR: Chronic Phase) Lab Results  Component Value Date   CRP 2 12/22/2021   ESRSEDRATE 20 12/22/2021         Note: Above Lab results reviewed.  Recent Imaging Review  DG Chest 2 View CLINICAL DATA:  Persistent cough. Productive cough for 10 days. Dizziness for 3 months.  EXAM: CHEST - 2 VIEW  COMPARISON:  04/11/2019  FINDINGS: Similar elevation of the RIGHT hemidiaphragm. Heart size is normal. Lungs are free of focal consolidations and pleural effusions.  IMPRESSION: No active cardiopulmonary disease.  Electronically Signed   By: Nolon Nations M.D.   On: 08/18/2022 08:32 Note: Reviewed        Physical Exam  General appearance: Well nourished, well developed, and well hydrated. In no apparent acute distress Mental status: Alert, oriented x 3 (person, place, & time)       Respiratory: No evidence of acute respiratory distress Eyes: PERLA Vitals: BP (!) 166/67   Pulse 75   Temp (!) 97.4 F (36.3 C)   Ht '5\' 6"'$  (1.676 m)   Wt 226 lb (102.5 kg)   SpO2 96%   BMI 36.48 kg/m  BMI: Estimated body mass index is 36.48 kg/m as calculated from the following:   Height as of this encounter: '5\' 6"'$  (1.676 m).   Weight as of this encounter: 226 lb (102.5 kg). Ideal: Ideal body  weight: 59.3 kg (130 lb 11.7 oz) Adjusted ideal body weight: 76.6 kg (168 lb 13.4 oz)  Assessment   Diagnosis Status  1. Chronic hip pain (3ry area of  Pain) (Bilateral) (L>R)   2. Grade 1 Anterolisthesis of lumbar spine (L3/L4) & (5 mm) (L4/L5)   3. Abnormal MRI, lumbar spine (01/31/2020)   4. Chronic low back pain of over 3 months duration   5. Chronic low back pain (1ry area of Pain) (Bilateral) (L>R) w/o sciatica   6. DDD (degenerative disc disease), lumbar   7. Lumbar central spinal stenosis w/o neurogenic claudication (L2-3, L3-4, L4-5)   8. Lumbar foraminal stenosis (Bilateral: L2-3, L4-5) (Right: (Severe) L4-5)   9. Lumbar lateral recess stenosis (Bilateral: L2-3, L3-4, and L4-5) (Severe)    Controlled Controlled Controlled   Updated Problems: No problems updated.  Plan of Care  Problem-specific:  No problem-specific Assessment & Plan notes found for this encounter.  Kendra [name] has a current medication list which includes the following long-term medication(s): carbidopa-levodopa, carbidopa-levodopa er, fluoxetine hcl, gabapentin, lisinopril-hydrochlorothiazide, risperidone, and trazodone.  Pharmacotherapy (Medications Ordered): No orders of the defined types were placed in this encounter.  Orders:  Orders Placed This Encounter  Procedures   Lumbar Epidural Injection    Standing Status:   Future    Standing Expiration Date:   04/06/2023    Scheduling Instructions:     Procedure: Interlaminar Lumbar Epidural Steroid injection (LESI)  L3-4     Laterality: Left-sided     Sedation: Patient's choice.     Timeframe: ASAP    Order Specific Question:   Where will this procedure be performed?    Answer:   [facility]   DG HIP UNILAT W OR W/O PELVIS 2-3 VIEWS LEFT    Please describe any evidence of DJD, such as joint narrowing, asymmetry, cysts, or any anomalies in bone density, production, or erosion.    Standing Status:   Future    Standing Expiration Date:   02/04/2023    Scheduling Instructions:     Please make sure that the patient understands that this needs to be done as soon as possible. Never have the  patient do the imaging "just before the next appointment". Inform patient that having the imaging done within the [facility] will expedite the availability of the results and will provide      imaging availability to the requesting physician. In addition inform the patient that the imaging order has an expiration date and will not be renewed if not done within the active period.    Order Specific Question:   Reason for Exam (SYMPTOM  OR DIAGNOSIS REQUIRED)    Answer:   Left hip pain/arthralgia    Order Specific Question:   Preferred imaging location?    Answer:   [facility]    Order Specific Question:   Call Results- Best Contact Number?    Answer:   [phone] ([facility])    Order Specific Question:   Release to patient    Answer:   Immediate   DG Lumbar Spine Complete W/Bend    Patient presents with axial pain with possible radicular component. Please assist [place] in identifying specific level(s) and laterality of any additional findings such as: 1. Facet (Zygapophyseal) joint DJD (Hypertrophy, space narrowing, subchondral sclerosis, and/or osteophyte formation) 2. DDD and/or IVDD (Loss of disc height, desiccation, gas patterns, osteophytes, endplate sclerosis, or "Black disc disease") 3. Pars defects 4. Spondylolisthesis, spondylosis, and/or spondyloarthropathies (include Degree/Grade  of displacement in mm) (stability) 5. Vertebral body Fractures (acute/chronic) (state percentage of collapse) 6. Demineralization (osteopenia/osteoporotic) 7. Bone pathology 8. Foraminal narrowing  9. Surgical changes    Standing Status:   Future    Standing Expiration Date:   02/04/2023    Scheduling Instructions:     Please make sure that the patient understands that this needs to be done as soon as possible. Never have the patient do the imaging "just before the next appointment". Inform patient that having the imaging done within the [facility] will expedite the availability of the  results and will provide      imaging availability to the requesting physician. In addition inform the patient that the imaging order has an expiration date and will not be renewed if not done within the active period.    Order Specific Question:   Reason for Exam (SYMPTOM  OR DIAGNOSIS REQUIRED)    Answer:   Low back pain    Order Specific Question:   Preferred imaging location?    Answer:   [facility]    Order Specific Question:   Call Results- Best Contact Number?    Answer:   [phone] ([facility])    Order Specific Question:   Radiology Contrast Protocol - do NOT remove file path    Answer:   [email or web address]    Order Specific Question:   Release to patient    Answer:   Immediate   Follow-up plan:   Return for [facility]): (L) L2-3 vs L3-4 LESI #2.      Interventional Therapies  Risk  Complexity Considerations:   Allergy to shellfish    Planned  Pending:   Therapeutic left L2-3 vs L3-4 LESI #2  Diagnostic x-rays of the left hip  Diagnostic x-rays of the lumbar spine with flexion views    Under consideration:   Diagnostic left L2-3 LESI #2  Diagnostic midline L3-4 LESI #1  Diagnostic bilateral lumbar facet MBB #1    Completed:   Diagnostic/diagnostic left L2-3 LESI x1 (02/24/2022) (100/100/75/75)    Completed by other providers:   (06/27/2021) bilateral L5-S1 transforaminal ESI (75% relief) by Sharlet Salina, DO (08/14/2021) left L5-S1 transforaminal ESI + left S1 TFESI by Sharlet Salina, DO   Therapeutic  Palliative (PRN) options:   None established      Recent Visits No visits were found meeting these conditions. Showing recent visits within past 90 days and meeting all other requirements Today's Visits Date Type Provider Dept  01/04/23 Office Visit Milinda Pointer, MD [facility]  Showing today's visits and meeting all other requirements Future Appointments No visits were found meeting  these conditions. Showing future appointments within next 90 days and meeting all other requirements  I discussed the assessment and treatment plan with the patient. The patient was provided an opportunity to ask questions and all were answered. The patient agreed with the plan and demonstrated an understanding of the instructions.  Patient advised to call back or seek an in-person evaluation if the symptoms or condition worsens.  Duration of encounter: 35 minutes.  Total time on encounter, as per AMA guidelines included both the face-to-face and non-face-to-face time personally spent by the physician and/or other qualified health care professional(s) on the day of the encounter (includes time in activities that require the physician or other qualified health care professional and does not include time in activities normally performed by clinical staff). Physician's time may include the following activities when performed: Preparing to see the  patient (e.g., pre-charting review of records, searching for previously ordered imaging, lab work, and nerve conduction tests) Review of prior analgesic pharmacotherapies. Reviewing PMP Interpreting ordered tests (e.g., lab work, imaging, nerve conduction tests) Performing post-procedure evaluations, including interpretation of diagnostic procedures Obtaining and/or reviewing separately obtained history Performing a medically appropriate examination and/or evaluation Counseling and educating the patient/family/caregiver Ordering medications, tests, or procedures Referring and communicating with other health care professionals (when not separately reported) Documenting clinical information in the electronic or other health record Independently interpreting results (not separately reported) and communicating results to the patient/ family/caregiver Care coordination (not separately reported)  Note by: Gaspar Cola, MD Date: 01/04/2023; Time: 11:56  AM

## 2023-01-04 ENCOUNTER — Encounter: Payer: Self-pay | Admitting: Psychiatry

## 2023-01-04 ENCOUNTER — Other Ambulatory Visit
Admission: RE | Admit: 2023-01-04 | Discharge: 2023-01-04 | Disposition: A | Discharge status: Home / Self Care | Payer: Medicare PPO | Source: Home / Self Care | Attending: Psychiatry | Admitting: Psychiatry

## 2023-01-04 ENCOUNTER — Encounter: Payer: Self-pay | Admitting: Pain Medicine

## 2023-01-04 ENCOUNTER — Ambulatory Visit
Admission: RE | Admit: 2023-01-04 | Discharge: 2023-01-04 | Disposition: A | Discharge status: Home / Self Care | Payer: Medicare PPO | Source: Ambulatory Visit | Attending: Pain Medicine | Admitting: Pain Medicine

## 2023-01-04 ENCOUNTER — Ambulatory Visit (INDEPENDENT_AMBULATORY_CARE_PROVIDER_SITE_OTHER): Payer: Medicare PPO | Admitting: Psychiatry

## 2023-01-04 ENCOUNTER — Ambulatory Visit
Admission: RE | Admit: 2023-01-04 | Discharge: 2023-01-04 | Disposition: A | Discharge status: Home / Self Care | Payer: Medicare PPO | Attending: Pain Medicine | Admitting: Pain Medicine

## 2023-01-04 ENCOUNTER — Ambulatory Visit (HOSPITAL_BASED_OUTPATIENT_CLINIC_OR_DEPARTMENT_OTHER): Payer: Medicare PPO | Admitting: Pain Medicine

## 2023-01-04 VITALS — BP 157/79 | HR 80 | Temp 97.1°F | Ht 66.0 in | Wt 226.8 lb

## 2023-01-04 VITALS — BP 166/67 | HR 75 | Temp 97.4°F | Ht 66.0 in | Wt 226.0 lb

## 2023-01-04 DIAGNOSIS — R937 Abnormal findings on diagnostic imaging of other parts of musculoskeletal system: Secondary | ICD-10-CM

## 2023-01-04 DIAGNOSIS — F411 Generalized anxiety disorder: Secondary | ICD-10-CM

## 2023-01-04 DIAGNOSIS — M25552 Pain in left hip: Secondary | ICD-10-CM | POA: Diagnosis not present

## 2023-01-04 DIAGNOSIS — F5105 Insomnia due to other mental disorder: Secondary | ICD-10-CM | POA: Diagnosis not present

## 2023-01-04 DIAGNOSIS — M5136 Other intervertebral disc degeneration, lumbar region: Secondary | ICD-10-CM | POA: Diagnosis not present

## 2023-01-04 DIAGNOSIS — M25551 Pain in right hip: Secondary | ICD-10-CM | POA: Diagnosis not present

## 2023-01-04 DIAGNOSIS — M51369 Other intervertebral disc degeneration, lumbar region without mention of lumbar back pain or lower extremity pain: Secondary | ICD-10-CM

## 2023-01-04 DIAGNOSIS — G8929 Other chronic pain: Secondary | ICD-10-CM | POA: Diagnosis not present

## 2023-01-04 DIAGNOSIS — F3176 Bipolar disorder, in full remission, most recent episode depressed: Secondary | ICD-10-CM | POA: Diagnosis not present

## 2023-01-04 DIAGNOSIS — M4316 Spondylolisthesis, lumbar region: Secondary | ICD-10-CM

## 2023-01-04 DIAGNOSIS — M47816 Spondylosis without myelopathy or radiculopathy, lumbar region: Secondary | ICD-10-CM | POA: Diagnosis not present

## 2023-01-04 DIAGNOSIS — M48061 Spinal stenosis, lumbar region without neurogenic claudication: Secondary | ICD-10-CM

## 2023-01-04 DIAGNOSIS — M1612 Unilateral primary osteoarthritis, left hip: Secondary | ICD-10-CM | POA: Diagnosis not present

## 2023-01-04 DIAGNOSIS — M545 Low back pain, unspecified: Secondary | ICD-10-CM | POA: Insufficient documentation

## 2023-01-04 DIAGNOSIS — Z79899 Other long term (current) drug therapy: Secondary | ICD-10-CM | POA: Insufficient documentation

## 2023-01-04 LAB — TSH: TSH: 1.653 u[IU]/mL (ref 0.350–4.500)

## 2023-01-04 MED ORDER — RISPERIDONE 0.5 MG PO TABS: 0.5000 mg | ORAL_TABLET | Freq: Two times a day (BID) | ORAL | 1 refills | Status: DC

## 2023-01-04 MED ORDER — TRAZODONE HCL 100 MG PO TABS: 100.0000 mg | ORAL_TABLET | Freq: Every day | ORAL | 1 refills | Status: DC

## 2023-01-04 NOTE — Patient Instructions (Signed)

## 2023-01-04 NOTE — Progress Notes (Unsigned)
[facility] MD OP Progress Note  01/04/2023 10:35 AM Lutie Sawada  MRN:  [id number]  Chief Complaint:  Chief Complaint  Patient presents with   Follow-up   Anxiety   Depression   Medication Refill   HPI: Kendra [name] is a 77 year old Caucasian female, married, retired, lives in [place], has a history of bipolar disorder, GAD, insomnia, hyperprolactinemia, multifocal stage I right breast cancer status postlumpectomy, ovarian cyst removal, Parkinson's disease, bilateral hearing loss was evaluated in the office today.  Patient being a limited historian majority of information obtained from spouse-[name].  per spouse patient is overall doing fairly well with regards to her mood.  Patient is compliant on medications.  Denies any significant side effects.  Patient however is currently struggling with a lot of pain, her Parkinson's disease which does affect her mobility.  Patient is needing a lot more support at home and he is having trouble providing it.  He is hopeful he will be able to look into some support in the community.  He has not been able to reach out to [facility] yet.  He is planning to.  He has also reached out to primary care.  Spouse believes patient is in a lot more pain than what she is able to verbalize.  They do have an appointment with pain provider this morning and is planning to discuss.  Patient does have physical therapy appointments coming up next week.  Patient today appeared to be alert, oriented to person and situation, self.  She was able to tell the month as March and the day as Monday and the date as fourth or third.  She was able to spell the word 'WORLD' forward but not backward.  Had trouble doing calculation.  Patient with 3 word memory immediate 3 out of 3 after 5 minutes 0 out of 3.  Patient definitely with cognitive issues likely from Parkinson's disease as well as chronic bipolar disorder and being on multiple medications.  Patient does report she is having trouble with  pain however unable to verbalize.  However when questions were rephrased she was able to state that she was having trouble stating, walking as well as sleeping due to pain.  She agrees to discuss with pain provider.  Patient denies any suicidality, homicidality or perceptual disturbances.  Patient denies any other concerns today.  Visit Diagnosis:    ICD-10-CM   1. Bipolar disorder, in full remission, most recent episode depressed ([place])  F31.76 TSH    Prolactin    risperiDONE (RISPERDAL) 0.5 MG tablet    2. GAD (generalized anxiety disorder)  F41.1 TSH    Prolactin    3. Insomnia due to mental condition  F51.05 traZODone (DESYREL) 100 MG tablet    4. High risk medication use  Z79.899 TSH    Prolactin    5. Insomnia due to mental condition  F51.05 traZODone (DESYREL) 100 MG tablet   mood      Past Psychiatric History: Reviewed past psychiatric history from progress note on 03/01/2019.  Past trials of risperidone, Prozac, Seroquel, Ativan.  Past Medical History:  Past Medical History:  Diagnosis Date   Anxiety    Breast cancer ([place]) 03/2018   right breast cancer at 11:00 and 1:00   Bursitis    bilateral hips and knees   Complication of anesthesia    Depression    Family history of breast cancer 03/24/2018   Family history of lung cancer 03/24/2018   History of alcohol dependence ([place]) 2007  no ETOH; resolved since 2007   History of hiatal hernia    History of psychosis 2014   due to sleep disturbance   Hypertension    Parkinson disease    Personal history of radiation therapy 06/2018-07/2018   right breast ca   PONV (postoperative nausea and vomiting)     Past Surgical History:  Procedure Laterality Date   BREAST BIOPSY Right 03/14/2018   11:00 DCIS and invasive ductal carcinoma   BREAST BIOPSY Right 03/14/2018   1:00 Invasive ductal carcinoma   BREAST LUMPECTOMY Right 04/27/2018   lumpectomy of 11 and 1:00 cancers, clear margins, negative LN   BREAST LUMPECTOMY  WITH RADIOACTIVE SEED AND SENTINEL LYMPH NODE BIOPSY Right 04/27/2018   Procedure: RIGHT BREAST LUMPECTOMY WITH RADIOACTIVE SEED X 2 AND RIGHT SENTINEL LYMPH NODE BIOPSY;  Surgeon: [name], MD;  Location: [facility];  Service: General;  Laterality: Right;   CATARACT EXTRACTION W/PHACO Left 08/30/2019   Procedure: CATARACT EXTRACTION PHACO AND INTRAOCULAR LENS PLACEMENT ([place]) LEFT panoptix toric  01:20.5  12.7%  10.26;  Surgeon: Leandrew Koyanagi, MD;  Location: [facility];  Service: Ophthalmology;  Laterality: Left;   CATARACT EXTRACTION W/PHACO Right 09/20/2019   Procedure: CATARACT EXTRACTION PHACO AND INTRAOCULAR LENS PLACEMENT ([facility]) RIGHT 5.71  00:36.4  15.7%;  Surgeon: Leandrew Koyanagi, MD;  Location: [facility];  Service: Ophthalmology;  Laterality: Right;   LAPAROTOMY N/A 02/10/2018   Procedure: EXPLORATORY LAPAROTOMY;  Surgeon: Isabel Caprice, MD;  Location: [facility];  Service: Gynecology;  Laterality: N/A;   MASS EXCISION  01/2018   abdominal   OVARIAN CYST REMOVAL     SALPINGOOPHORECTOMY Bilateral 02/10/2018   Procedure: BILATERAL SALPINGO OOPHORECTOMY; PERITONEAL WASHINGS;  Surgeon: Isabel Caprice, MD;  Location: [facility];  Service: Gynecology;  Laterality: Bilateral;   TONSILLECTOMY     TONSILLECTOMY AND ADENOIDECTOMY  1953    Family Psychiatric History: Reviewed family psychiatric history from progress note on 03/01/2019.  Family History:  Family History  Problem Relation Age of Onset   Diabetes Mother    Breast cancer Mother 59   Hypertension Mother    Lung cancer Father        asbestos exposure   Heart disease Brother 40   Breast cancer Cousin 53       paternal cousin   Heart disease Paternal Grandmother 70   Heart disease Paternal Grandfather     Social History: Reviewed social history from progress note on 03/01/2019. Social History   Socioeconomic History   Marital status: Married    Spouse name: robert   Number  of children: 0   Years of education: Not on file   Highest education level: Some college, no degree  Occupational History   Occupation: retired Furniture conservator/restorer of education  Tobacco Use   Smoking status: Never   Smokeless tobacco: Never  Vaping Use   Vaping Use: Never used  Substance and Sexual Activity   Alcohol use: Not Currently    Comment: alcohol dependence prior to 2007   Drug use: Never   Sexual activity: Yes    Partners: Male    Birth control/protection: Surgical  Other Topics Concern   Not on file  Social History Narrative   Not on file   Social Determinants of Health   Financial Resource Strain: Low Risk  (08/29/2020)   Overall Financial Resource Strain (CARDIA)    Difficulty of Paying Living Expenses: Not hard at all  Food Insecurity: No  Food Insecurity (08/29/2020)   Hunger Vital Sign    Worried About Running Out of Food in the Last Year: Never true    Ran Out of Food in the Last Year: Never true  Transportation Needs: No Transportation Needs (08/29/2020)   PRAPARE - Hydrologist (Medical): No    Lack of Transportation (Non-Medical): No  Physical Activity: Inactive (08/29/2020)   Exercise Vital Sign    Days of Exercise per Week: 0 days    Minutes of Exercise per Session: 0 min  Stress: Stress Concern Present (08/29/2020)   [place]    Feeling of Stress : To some extent  Social Connections: Moderately Integrated (08/29/2020)   Social Connection and Isolation Panel [NHANES]    Frequency of Communication with Friends and Family: More than three times a week    Frequency of Social Gatherings with Friends and Family: More than three times a week    Attends Religious Services: Never    Marine scientist or Organizations: Yes    Attends Archivist Meetings: 1 to 4 times per year    Marital Status: Married    Allergies:  Allergies   Allergen Reactions   Hydroxyzine Anaphylaxis    Tongue swollen   Other Other (See Comments)    Food poisoning    Shrimp [Shellfish Allergy] Other (See Comments)    Food poisoning     Metabolic Disorder Labs: Lab Results  Component Value Date   HGBA1C 6.0 (H) 11/06/2022   Lab Results  Component Value Date   PROLACTIN 70.5 (H) 05/12/2022   PROLACTIN 96.8 (H) 12/17/2021   Lab Results  Component Value Date   CHOL 191 11/06/2022   TRIG 112 11/06/2022   HDL 58 11/06/2022   CHOLHDL 3.3 12/17/2021   LDLCALC 113 (H) 11/06/2022   LDLCALC 113 (H) 12/17/2021   Lab Results  Component Value Date   TSH 2.31 04/13/2019   TSH 2.543 07/25/2018    Therapeutic Level Labs: No results found for: "LITHIUM" No results found for: "VALPROATE" No results found for: "CBMZ"  Current Medications: Current Outpatient Medications  Medication Sig Dispense Refill   Acetaminophen 500 MG capsule Take 1,000 mg by mouth 2 (two) times daily.     carbidopa-levodopa (SINEMET IR) 25-100 MG tablet Take 1 tablet by mouth daily in the afternoon. In the morning     Carbidopa-Levodopa ER (SINEMET CR) 25-100 MG tablet controlled release Take 1 tablet by mouth at bedtime.     cyanocobalamin (,VITAMIN B-12,) 1000 MCG/ML injection INJECT 1ML INTO THE MUSCLE EVERY 30 DAYS 3 mL 3   denosumab (PROLIA) 60 MG/ML SOSY injection Inject 60 mg into the skin every 6 (six) months. takes annually     FLUoxetine HCl 60 MG TABS Take 1 tablet by mouth daily. 90 tablet 1   gabapentin (NEURONTIN) 100 MG capsule Take 3 capsules (300 mg total) by mouth 3 (three) times daily. (Patient taking differently: Take 100 mg by mouth 3 (three) times daily.) 280 capsule 0   letrozole (FEMARA) 2.5 MG tablet Take 1 tablet (2.5 mg total) by mouth daily. 90 tablet 1   lisinopril-hydrochlorothiazide (ZESTORETIC) 20-12.5 MG tablet TAKE 1 TABLET BY MOUTH DAILY 90 tablet 0   nystatin (NYAMYC) powder APPLY TOPICALLY TO AFFECTED AREA(S) THREE TIMES DAILY  (Patient not taking: Reported on 01/04/2023) 60 g 0   risperiDONE (RISPERDAL) 0.5 MG tablet Take 1 tablet (0.5 mg total) by  mouth 2 (two) times daily. 180 tablet 1   traZODone (DESYREL) 100 MG tablet Take 1 tablet (100 mg total) by mouth at bedtime. 90 tablet 1   No current facility-administered medications for this visit.     Musculoskeletal: Strength & Muscle Tone: within normal limits Gait & Station: normal Patient leans: N/A  Psychiatric Specialty Exam: Review of Systems  Unable to perform ROS: Psychiatric disorder    Blood pressure (!) 166/76, pulse 93, temperature (!) 97.1 F (36.2 C), temperature source Skin, height '5\' 6"'$  (1.676 m), weight 226 lb 12.8 oz (102.9 kg).Body mass index is 36.61 kg/m.  General Appearance: Casual  Eye Contact:  Good  Speech:  Clear and Coherent  Volume:  Normal  Mood:  Euthymic  Affect:  Congruent  Thought Process:  Goal Directed and Descriptions of Associations: Intact  Orientation:  Other:  place , self, situation  Thought Content: Logical   Suicidal Thoughts:  No  Homicidal Thoughts:  No  Memory:  Immediate;   Fair Recent;   Fair Remote;   Poor  Judgement:  Fair  Insight:  Shallow  Psychomotor Activity:  Tremor  Concentration:  Concentration: Fair and Attention Span: Fair  Recall:  [employer] of Knowledge: Fair  Language: Fair  Akathisia:  No  Handed:  Right  AIMS (if indicated): done  Assets:  Communication Skills Desire for Improvement Housing Social Support  ADL's:  Intact  Cognition: baseline  Sleep:   restless due to pain on and off   Screenings: Administrator, Civil Service Office Visit from 09/04/2022 in [facility] Office Visit from 06/04/2022 in [facility] Office Visit from 03/02/2022 in [facility] Office Visit from 12/01/2021 in [facility] Office Visit from 01/21/2021  in [facility] Total Score 0 0 0 0 0      GAD-7    Flowsheet Row Counselor from 03/25/2021 in [facility] Counselor 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 [facility]  Total GAD-7 Score 4 7      PHQ2-9    [place] Visit from 12/15/2022 in [facility] Visit from 11/06/2022 in [facility] from 09/28/2022 in [facility] at [place] from 09/04/2022 in [facility] Office Visit from 08/17/2022 in [facility]  PHQ-2 Total Score 2 4 0 1 0  PHQ-9 Total Score 9 21 -- 6 8      Flowsheet Row Counselor from 09/28/2022 in [facility] at [facility] from 09/04/2022 in [facility] Office Visit from 03/02/2022 in [facility] No Risk No Risk No Risk        Assessment and Plan: Kamar Tolson is a 77 year old Caucasian female, married, lives in [place], has a history of bipolar disorder, GAD, Parkinson's disease, was evaluated in the office today.  Patient is currently struggling with pain, mobility issues mostly due to Parkinson's disease, cognitive changes-will benefit from the following plan.  Plan Bipolar disorder in remission Risperidone 0.5 mg p.o. daily in the morning and 0.5 mg at bedtime Prozac 60 mg p.o. daily  GAD-stable  Continue CBT. Prozac 60 mg p.o. daily  Insomnia-stable Trazodone 100 mg p.o. nightly  High risk medication use-will order labs-prolactin level, TSH.  Patient to go to [facility] lab.  Collateral information obtained from spouse-spouse to discuss patient's pain problem with pain provider as well as to follow up with neurology-Dr.  Manuella Ghazi.  Patient may benefit from an in person psychotherapist-advised to schedule an appointment with our therapist.  Will coordinate care with Ms.Christina Hussami.    Collaboration of Care: Collaboration of Care: Other I have coordinated care with therapist.  Communicated with Ms. Christina Hussami  Patient/Guardian was advised Release of Information must be obtained prior to any record release in order to collaborate their care with an outside provider. Patient/Guardian was advised if they have not already done so to contact the registration department to sign all necessary forms in order for [place] to release information regarding their care.   Consent: Patient/Guardian gives verbal consent for treatment and assignment of benefits for services provided during this visit. Patient/Guardian expressed understanding and agreed to proceed.   This note was generated in part or whole with voice recognition software. Voice recognition is usually quite accurate but there are transcription errors that can and very often do occur. I apologize for any typographical errors that were not detected and corrected.      Ursula Alert, MD 01/04/2023, 10:35 AM

## 2023-01-05 ENCOUNTER — Telehealth: Payer: Self-pay

## 2023-01-05 LAB — PROLACTIN: Prolactin: 78.3 ng/mL — ABNORMAL HIGH (ref 3.6–25.2)

## 2023-01-05 NOTE — Telephone Encounter (Signed)
I called the patient to schedule her Lesi. She states she doesn't know what it is, and has no instructions. Can someone call her and explain that it is and what she needs to do before procedure

## 2023-01-05 NOTE — Telephone Encounter (Signed)
Called patient to answer her questions. No answer. LVM. She had an epidural here before. I left a message for her to call back with any questions.

## 2023-01-07 ENCOUNTER — Other Ambulatory Visit: Payer: Self-pay | Admitting: Family Medicine

## 2023-01-12 ENCOUNTER — Ambulatory Visit: Payer: Medicare PPO | Attending: Pain Medicine | Admitting: Pain Medicine

## 2023-01-12 ENCOUNTER — Ambulatory Visit
Admission: RE | Admit: 2023-01-12 | Discharge: 2023-01-12 | Disposition: A | Discharge status: Home / Self Care | Payer: Medicare PPO | Source: Ambulatory Visit | Attending: Pain Medicine | Admitting: Pain Medicine

## 2023-01-12 ENCOUNTER — Encounter: Payer: Self-pay | Admitting: Pain Medicine

## 2023-01-12 VITALS — BP 179/80 | HR 77 | Temp 97.9°F | Resp 18 | Ht 66.0 in | Wt 226.0 lb

## 2023-01-12 DIAGNOSIS — R937 Abnormal findings on diagnostic imaging of other parts of musculoskeletal system: Secondary | ICD-10-CM | POA: Diagnosis present

## 2023-01-12 DIAGNOSIS — M79605 Pain in left leg: Secondary | ICD-10-CM | POA: Diagnosis not present

## 2023-01-12 DIAGNOSIS — M79604 Pain in right leg: Secondary | ICD-10-CM | POA: Diagnosis not present

## 2023-01-12 DIAGNOSIS — M47816 Spondylosis without myelopathy or radiculopathy, lumbar region: Secondary | ICD-10-CM | POA: Diagnosis not present

## 2023-01-12 DIAGNOSIS — M545 Low back pain, unspecified: Secondary | ICD-10-CM | POA: Diagnosis not present

## 2023-01-12 DIAGNOSIS — M4316 Spondylolisthesis, lumbar region: Secondary | ICD-10-CM | POA: Diagnosis not present

## 2023-01-12 DIAGNOSIS — G8929 Other chronic pain: Secondary | ICD-10-CM | POA: Diagnosis not present

## 2023-01-12 DIAGNOSIS — M5136 Other intervertebral disc degeneration, lumbar region: Secondary | ICD-10-CM | POA: Diagnosis not present

## 2023-01-12 DIAGNOSIS — M48061 Spinal stenosis, lumbar region without neurogenic claudication: Secondary | ICD-10-CM | POA: Insufficient documentation

## 2023-01-12 DIAGNOSIS — M25551 Pain in right hip: Secondary | ICD-10-CM | POA: Diagnosis not present

## 2023-01-12 DIAGNOSIS — M25552 Pain in left hip: Secondary | ICD-10-CM | POA: Insufficient documentation

## 2023-01-12 MED ORDER — PENTAFLUOROPROP-TETRAFLUOROETH EX AERO
INHALATION_SPRAY | Freq: Once | CUTANEOUS | Status: AC
  Administered 2023-01-12: 30 via TOPICAL
  Filled 2023-01-12: qty 116

## 2023-01-12 MED ORDER — SODIUM CHLORIDE 0.9% FLUSH
2.0000 mL | Freq: Once | INTRAVENOUS | Status: AC
  Administered 2023-01-12: 2 mL

## 2023-01-12 MED ORDER — TRIAMCINOLONE ACETONIDE 40 MG/ML IJ SUSP
40.0000 mg | Freq: Once | INTRAMUSCULAR | Status: AC
  Administered 2023-01-12: 40 mg
  Filled 2023-01-12: qty 1

## 2023-01-12 MED ORDER — LIDOCAINE HCL 2 % IJ SOLN
20.0000 mL | Freq: Once | INTRAMUSCULAR | Status: AC
  Administered 2023-01-12: 100 mg
  Filled 2023-01-12: qty 40

## 2023-01-12 MED ORDER — ROPIVACAINE HCL 2 MG/ML IJ SOLN
2.0000 mL | Freq: Once | INTRAMUSCULAR | Status: AC
  Administered 2023-01-12: 2 mL via EPIDURAL
  Filled 2023-01-12: qty 20

## 2023-01-12 NOTE — Patient Instructions (Addendum)
  ____________________________________________________________________________________________  Patient Information update  To: All of our patients.  Re: Name change.  It has been made official that our current name, "[facility]"   will soon be changed to "[name]".   The purpose of this change is to eliminate any confusion created by the concept of our practice being a "Medication Management Pain Clinic". In the past this has led to the misconception that we treat pain primarily by the use of prescription medications.  Nothing can be farther from the truth.   Understanding PAIN MANAGEMENT: To further understand what our practice does, you first have to understand that "Pain Management" is a subspecialty that requires additional training once a physician has completed their specialty training, which can be in either Anesthesia, Neurology, Psychiatry, or Physical Medicine and Rehabilitation (PMR). Each one of these contributes to the final approach taken by each physician to the management of their patient's pain. To be a "Pain Management Specialist" you must have first completed one of the specialty trainings below.  Anesthesiologists - trained in clinical pharmacology and interventional techniques such as nerve blockade and regional as well as central neuroanatomy. They are trained to block pain before, during, and after surgical interventions.  Neurologists - trained in the diagnosis and pharmacological treatment of complex neurological conditions, such as Multiple Sclerosis, Parkinson's, spinal cord injuries, and other systemic conditions that may be associated with symptoms that may include but are not limited to pain. They tend to rely primarily on the treatment of chronic pain using prescription medications.  Psychiatrist - trained in conditions affecting the psychosocial  wellbeing of patients including but not limited to depression, anxiety, schizophrenia, personality disorders, addiction, and other substance use disorders that may be associated with chronic pain. They tend to rely primarily on the treatment of chronic pain using prescription medications.   Physical Medicine and Rehabilitation (PMR) physicians, also known as physiatrists - trained to treat a wide variety of medical conditions affecting the brain, spinal cord, nerves, bones, joints, ligaments, muscles, and tendons. Their training is primarily aimed at treating patients that have suffered injuries that have caused severe physical impairment. Their training is primarily aimed at the physical therapy and rehabilitation of those patients. They may also work alongside orthopedic surgeons or neurosurgeons using their expertise in assisting surgical patients to recover after their surgeries.  INTERVENTIONAL PAIN MANAGEMENT is sub-subspecialty of Pain Management.  Our physicians are Board-certified in Anesthesia, Pain Management, and Interventional Pain Management.  This meaning that not only have they been trained and Board-certified in their specialty of Anesthesia, and subspecialty of Pain Management, but they have also received further training in the sub-subspecialty of Interventional Pain Management, in order to become Board-certified as INTERVENTIONAL PAIN MANAGEMENT SPECIALIST.    Mission: Our goal is to use our skills in  INTERVENTIONAL PAIN MANAGEMENT as alternatives to the chronic use of prescription opioid medications for the treatment of pain. To make this more clear, we have changed our name to reflect what we do and offer. We will continue to offer medication management assessment and recommendations, but we will not be taking over any patient's medication management.  ____________________________________________________________________________________________     

## 2023-01-12 NOTE — Progress Notes (Signed)
PROVIDER NOTE: Interpretation of information contained herein should be left to medically-trained personnel. Specific patient instructions are provided elsewhere under "Patient Instructions" section of medical record. This document was created in part using STT-dictation technology, any transcriptional errors that may result from this process are unintentional.  Patient: Kendra [name] Type: Established DOB: 09/18/1946 MRN: [id number] PCP: Virginia Crews, MD  Service: Procedure DOS: 01/12/2023 Setting: Ambulatory Location: Ambulatory outpatient facility Delivery: Face-to-face Provider: Gaspar Cola, MD Specialty: Interventional Pain Management Specialty designation: 09 Location: Outpatient facility Ref. Prov.: Milinda Pointer, MD       Interventional Therapy   Procedure: Lumbar epidural steroid injection (LESI) (interlaminar) #2    Laterality: Left   Level:  L2-3 Level.  Imaging: Fluoroscopic guidance         Anesthesia: Local anesthesia (1-2% Lidocaine) Anxiolysis: None                 Sedation: No Sedation                       DOS: 01/12/2023  Performed by: Gaspar Cola, MD  Purpose: Diagnostic/Therapeutic Indications: Lumbar radicular pain of intraspinal etiology of more than 4 weeks that has failed to respond to conservative therapy and is severe enough to impact quality of life or function. 1. Lumbar central spinal stenosis w/o neurogenic claudication (L2-3, L3-4, L4-5)   2. Grade 1 Anterolisthesis of lumbar spine (L3/L4) & (5 mm) (L4/L5)   3. Lumbar lateral recess stenosis (Bilateral: L2-3, L3-4, and L4-5) (Severe)   4. Lumbar foraminal stenosis (Bilateral: L2-3, L4-5) (Right: (Severe) L4-5)   5. Chronic low back pain of over 3 months duration   6. Chronic low back pain (1ry area of Pain) (Bilateral) (L>R) w/o sciatica   7. Chronic lower extremity pain (2ry area of Pain) (Bilateral) (L>R)   8. DDD (degenerative disc disease), lumbar   9. Osteoarthritis  of lumbar spine    NAS-11 Pain score:   Pre-procedure: 6 /10   Post-procedure: 6 /10      Position / Prep / Materials:  Position: Prone w/ head of the table raised (slight reverse trendelenburg) to facilitate breathing.  Prep solution: DuraPrep (Iodine Povacrylex [0.7% available iodine] and Isopropyl Alcohol, 74% w/w) Prep Area: Entire Posterior Lumbar Region from lower scapular tip down to mid buttocks area and from flank to flank. Materials:  Tray: Epidural tray Needle(s):  Type: Epidural needle (Tuohy) Gauge (G):  17 Length: Regular (3.5-in) Qty: 1   Pre-op H&P Assessment:  Kendra [name] is a 77 y.o. (year old), female patient, seen today for interventional treatment. She  has a past surgical history that includes Ovarian cyst removal; Tonsillectomy and adenoidectomy [id number]); Tonsillectomy; laparotomy (N/A, 02/10/2018); Salpingoophorectomy (Bilateral, 02/10/2018); Mass excision (01/2018); Breast lumpectomy with radioactive seed and sentinel lymph node biopsy (Right, 04/27/2018); Cataract extraction w/PHACO (Left, 08/30/2019); Cataract extraction w/PHACO (Right, 09/20/2019); Breast biopsy (Right, 03/14/2018); Breast biopsy (Right, 03/14/2018); and Breast lumpectomy (Right, 04/27/2018). [name] has a current medication list which includes the following prescription(s): acetaminophen, carbidopa-levodopa, carbidopa-levodopa er, cyanocobalamin, denosumab, fluoxetine hcl, gabapentin, letrozole, lisinopril-hydrochlorothiazide, risperidone, and trazodone. Her primarily concern today is the Back Pain (Left, lower)  Initial Vital Signs:  Pulse/HCG Rate: 68ECG Heart Rate: 78 Temp: 97.9 F (36.6 C) Resp: 16 BP: (!) 174/82 SpO2: 98 %  BMI: Estimated body mass index is 36.48 kg/m as calculated from the following:   Height as of this encounter: '5\' 6"'$  (1.676 m).   Weight as of this  encounter: 226 lb (102.5 kg).  Risk Assessment: Allergies: Reviewed. She is allergic to hydroxyzine, other, and  shrimp [shellfish allergy].  Allergy Precautions: None required Coagulopathies: Reviewed. None identified.  Blood-thinner therapy: None at this time Active Infection(s): Reviewed. None identified. Kendra [name] is afebrile  Site Confirmation: Kendra [name] was asked to confirm the procedure and laterality before marking the site Procedure checklist: Completed Consent: Before the procedure and under the influence of no sedative(s), amnesic(s), or anxiolytics, the patient was informed of the treatment options, risks and possible complications. To fulfill our ethical and legal obligations, as recommended by the American Medical Association's Code of Ethics, I have informed the patient of my clinical impression; the nature and purpose of the treatment or procedure; the risks, benefits, and possible complications of the intervention; the alternatives, including doing nothing; the risk(s) and benefit(s) of the alternative treatment(s) or procedure(s); and the risk(s) and benefit(s) of doing nothing. The patient was provided information about the general risks and possible complications associated with the procedure. These may include, but are not limited to: failure to achieve desired goals, infection, bleeding, organ or nerve damage, allergic reactions, paralysis, and death. In addition, the patient was informed of those risks and complications associated to Spine-related procedures, such as failure to decrease pain; infection (i.e.: Meningitis, epidural or intraspinal abscess); bleeding (i.e.: epidural hematoma, subarachnoid hemorrhage, or any other type of intraspinal or peri-dural bleeding); organ or nerve damage (i.e.: Any type of peripheral nerve, nerve root, or spinal cord injury) with subsequent damage to sensory, motor, and/or autonomic systems, resulting in permanent pain, numbness, and/or weakness of one or several areas of the body; allergic reactions; (i.e.: anaphylactic reaction); and/or  death. Furthermore, the patient was informed of those risks and complications associated with the medications. These include, but are not limited to: allergic reactions (i.e.: anaphylactic or anaphylactoid reaction(s)); adrenal axis suppression; blood sugar elevation that in diabetics may result in ketoacidosis or comma; water retention that in patients with history of congestive heart failure may result in shortness of breath, pulmonary edema, and decompensation with resultant heart failure; weight gain; swelling or edema; medication-induced neural toxicity; particulate matter embolism and blood vessel occlusion with resultant organ, and/or nervous system infarction; and/or aseptic necrosis of one or more joints. Finally, the patient was informed that Medicine is not an exact science; therefore, there is also the possibility of unforeseen or unpredictable risks and/or possible complications that may result in a catastrophic outcome. The patient indicated having understood very clearly. We have given the patient no guarantees and we have made no promises. Enough time was given to the patient to ask questions, all of which were answered to the patient's satisfaction. Ms. Gerk has indicated that she wanted to continue with the procedure. Attestation: I, the ordering provider, attest that I have discussed with the patient the benefits, risks, side-effects, alternatives, likelihood of achieving goals, and potential problems during recovery for the procedure that I have provided informed consent. Date  Time: 01/12/2023 10:53 AM   Pre-Procedure Preparation:  Monitoring: As per clinic protocol. Respiration, ETCO2, SpO2, BP, heart rate and rhythm monitor placed and checked for adequate function Safety Precautions: Patient was assessed for positional comfort and pressure points before starting the procedure. Time-out: I initiated and conducted the "Time-out" before starting the procedure, as per protocol. The  patient was asked to participate by confirming the accuracy of the "Time Out" information. Verification of the correct person, site, and procedure were performed and confirmed by me, the nursing  staff, and the patient. "Time-out" conducted as per Joint Commission's Universal Protocol (UP.01.01.01). Time: 1139  Description/Narrative of Procedure:          Target: Epidural space via interlaminar opening, initially targeting the lower laminar border of the superior vertebral body. Region: Lumbar Approach: Percutaneous paravertebral  Rationale (medical necessity): procedure needed and proper for the diagnosis and/or treatment of the patient's medical symptoms and needs. Procedural Technique Safety Precautions: Aspiration looking for blood return was conducted prior to all injections. At no point did we inject any substances, as a needle was being advanced. No attempts were made at seeking any paresthesias. Safe injection practices and needle disposal techniques used. Medications properly checked for expiration dates. SDV (single dose vial) medications used. Description of the Procedure: Protocol guidelines were followed. The procedure needle was introduced through the skin, ipsilateral to the reported pain, and advanced to the target area. Bone was contacted and the needle walked caudad, until the lamina was cleared. The epidural space was identified using "loss-of-resistance technique" with 2-3 ml of PF-NaCl (0.9% NSS), in a 5cc LOR glass syringe.  Vitals:   01/12/23 1053 01/12/23 1136 01/12/23 1141 01/12/23 1148  BP: (!) 174/82 (!) 182/97 (!) 207/106 (!) 179/80  Pulse: 68 77    Resp: '16 18 18   '$ Temp: 97.9 F (36.6 C)     TempSrc: Temporal     SpO2: 98% 95% 96%   Weight: 226 lb (102.5 kg)     Height: '5\' 6"'$  (1.676 m)       Start Time: 1139 hrs. End Time: 1144 hrs.  Imaging Guidance (Spinal):          Type of Imaging Technique: Fluoroscopy Guidance (Spinal) Indication(s): Assistance in  needle guidance and placement for procedures requiring needle placement in or near specific anatomical locations not easily accessible without such assistance. Exposure Time: Please see nurses notes. Contrast: Before injecting any contrast, we confirmed that the patient did not have an allergy to iodine, shellfish, or radiological contrast. Once satisfactory needle placement was completed at the desired level, radiological contrast was injected. Contrast injected under live fluoroscopy. No contrast complications. See chart for type and volume of contrast used. Fluoroscopic Guidance: I was personally present during the use of fluoroscopy. "Tunnel Vision Technique" used to obtain the best possible view of the target area. Parallax error corrected before commencing the procedure. "Direction-depth-direction" technique used to introduce the needle under continuous pulsed fluoroscopy. Once target was reached, antero-posterior, oblique, and lateral fluoroscopic projection used confirm needle placement in all planes. Images permanently stored in EMR. Interpretation: I personally interpreted the imaging intraoperatively. Adequate needle placement confirmed in multiple planes. Appropriate spread of contrast into desired area was observed. No evidence of afferent or efferent intravascular uptake. No intrathecal or subarachnoid spread observed. Permanent images saved into the patient's record.  Antibiotic Prophylaxis:   Anti-infectives (From admission, onward)    None      Indication(s): None identified  Post-operative Assessment:  Post-procedure Vital Signs:  Pulse/HCG Rate: 7778 Temp: 97.9 F (36.6 C) Resp: 18 BP: (!) 179/80 SpO2: 96 %  EBL: None  Complications: No immediate post-treatment complications observed by team, or reported by patient.  Note: The patient tolerated the entire procedure well. A repeat set of vitals were taken after the procedure and the patient was kept under observation  following institutional policy, for this type of procedure. Post-procedural neurological assessment was performed, showing return to baseline, prior to discharge. The patient was provided with post-procedure discharge instructions,  including a section on how to identify potential problems. Should any problems arise concerning this procedure, the patient was given instructions to immediately contact us, at any time, without hesitation. In any case, we plan to contact the patient by telephone for a follow-up status report regarding this interventional procedure.  Comments:  No additional relevant information.  Plan of Care (POC)  Orders:  Orders Placed This Encounter  Procedures   Lumbar Epidural Injection    Scheduling Instructions:     Procedure: Interlaminar LESI L2-3     Laterality: Left     Sedation: Patient's choice     Timeframe: Today    Order Specific Question:   Where will this procedure be performed?    Answer:   [facility]   [facility] C-ARM 1-60 MIN NO REPORT    Intraoperative interpretation by procedural physician at [facility].    Standing Status:   Standing    Number of Occurrences:   1    Order Specific Question:   Reason for exam:    Answer:   Assistance in needle guidance and placement for procedures requiring needle placement in or near specific anatomical locations not easily accessible without such assistance.   Informed Consent Details: Physician/Practitioner Attestation; Transcribe to consent form and obtain patient signature    Note: Always confirm laterality of pain with Ms. Brensinger, before procedure. Transcribe to consent form and obtain patient signature.    Order Specific Question:   Physician/Practitioner attestation of informed consent for procedure/surgical case    Answer:   I, the physician/practitioner, attest that I have discussed with the patient the benefits, risks, side effects, alternatives, likelihood of achieving goals and  potential problems during recovery for the procedure that I have provided informed consent.    Order Specific Question:   Procedure    Answer:   Lumbar epidural steroid injection under fluoroscopic guidance    Order Specific Question:   Physician/Practitioner performing the procedure    Answer:   Edie Darley A. Dossie Arbour, MD    Order Specific Question:   Indication/Reason    Answer:   Low back and/or lower extremity pain secondary to lumbar radiculitis   Provide equipment / supplies at bedside    Procedural tray: Epidural Tray (Disposable  single use) Skin infiltration needle: Regular 1.5-in, 25-G, (x1) Block needle size: Regular standard Catheter: No catheter required    Standing Status:   Standing    Number of Occurrences:   1    Order Specific Question:   Specify    Answer:   Epidural Tray   Miscellanous precautions    Standing Status:   Standing    Number of Occurrences:   1   Chronic Opioid Analgesic:  Tramadol 50 mg tablet, 1 tab p.o. twice daily MME/day: 10 mg/day   Medications ordered for procedure: Meds ordered this encounter  Medications   lidocaine (XYLOCAINE) 2 % (with pres) injection 400 mg   pentafluoroprop-tetrafluoroeth (GEBAUERS) aerosol   sodium chloride flush (NS) 0.9 % injection 2 mL   ropivacaine (PF) 2 mg/mL (0.2%) (NAROPIN) injection 2 mL   triamcinolone acetonide (KENALOG-40) injection 40 mg   Medications administered: We administered lidocaine, pentafluoroprop-tetrafluoroeth, sodium chloride flush, ropivacaine (PF) 2 mg/mL (0.2%), and triamcinolone acetonide.  See the medical record for exact dosing, route, and time of administration.  Follow-up plan:   Return in about 2 weeks (around 01/26/2023) for Proc-day (T,Th), (Face2F), (PPE).       Interventional Therapies  Risk  Complexity Considerations:   Allergy to shellfish    Planned  Pending:   Therapeutic left L2-3 vs L3-4 LESI #2  Diagnostic x-rays of the left hip  Diagnostic x-rays of the lumbar  spine with flexion views    Under consideration:   Diagnostic left L2-3 LESI #2  Diagnostic midline L3-4 LESI #1  Diagnostic bilateral lumbar facet MBB #1    Completed:   Diagnostic/diagnostic left L2-3 LESI x1 (02/24/2022) (100/100/75/75)    Completed by other providers:   (06/27/2021) bilateral L5-S1 transforaminal ESI (75% relief) by Sharlet Salina, DO (08/14/2021) left L5-S1 transforaminal ESI + left S1 TFESI by Sharlet Salina, DO   Therapeutic  Palliative (PRN) options:   None established       Recent Visits Date Type Provider Dept  01/04/23 Office Visit Milinda Pointer, MD [facility]  Showing recent visits within past 90 days and meeting all other requirements Today's Visits Date Type Provider Dept  01/12/23 Procedure visit Milinda Pointer, MD [facility]  Showing today's visits and meeting all other requirements Future Appointments Date Type Provider Dept  01/26/23 Appointment Milinda Pointer, MD [facility]  Showing future appointments within next 90 days and meeting all other requirements  Disposition: Discharge home  Discharge (Date  Time): 01/12/2023; 1155 hrs.   Primary Care Physician: Virginia Crews, MD Location: [facility] Note by: Gaspar Cola, MD (TTS technology used. I apologize for any typographical errors that were not detected and corrected.) Date: 01/12/2023; Time: 12:14 PM  Disclaimer:  Medicine is not an Chief Strategy Officer. The only guarantee in medicine is that nothing is guaranteed. It is important to note that the decision to proceed with this intervention was based on the information collected from the patient. The Data and conclusions were drawn from the patient's questionnaire, the interview, and the physical examination. Because the information was provided in large part by the patient, it cannot be guaranteed that it has not been purposely or unconsciously  manipulated. Every effort has been made to obtain as much relevant data as possible for this evaluation. It is important to note that the conclusions that lead to this procedure are derived in large part from the available data. Always take into account that the treatment will also be dependent on availability of resources and existing treatment guidelines, considered by other Pain Management Practitioners as being common knowledge and practice, at the time of the intervention. For Medico-Legal purposes, it is also important to point out that variation in procedural techniques and pharmacological choices are the acceptable norm. The indications, contraindications, technique, and results of the above procedure should only be interpreted and judged by a Board-Certified Interventional Pain Specialist with extensive familiarity and expertise in the same exact procedure and technique.

## 2023-01-13 ENCOUNTER — Telehealth: Payer: Self-pay | Admitting: Student in an Organized Health Care Education/Training Program

## 2023-01-13 ENCOUNTER — Telehealth: Payer: Self-pay

## 2023-01-13 NOTE — Telephone Encounter (Signed)
Patients husband states she is doing fine. States her gait is a little better today.

## 2023-01-13 NOTE — Telephone Encounter (Signed)
Post procedure follow up.  LM 

## 2023-01-13 NOTE — Telephone Encounter (Signed)
Kendra [name] is asking someone to call him at [phone]. They could not understand the message that was left by our office

## 2023-01-19 ENCOUNTER — Ambulatory Visit: Payer: Medicare PPO | Attending: Family Medicine

## 2023-01-19 DIAGNOSIS — R262 Difficulty in walking, not elsewhere classified: Secondary | ICD-10-CM | POA: Diagnosis not present

## 2023-01-19 DIAGNOSIS — M25552 Pain in left hip: Secondary | ICD-10-CM | POA: Diagnosis not present

## 2023-01-19 DIAGNOSIS — M6281 Muscle weakness (generalized): Secondary | ICD-10-CM | POA: Insufficient documentation

## 2023-01-19 DIAGNOSIS — R2681 Unsteadiness on feet: Secondary | ICD-10-CM | POA: Diagnosis not present

## 2023-01-19 DIAGNOSIS — R42 Dizziness and giddiness: Secondary | ICD-10-CM | POA: Diagnosis not present

## 2023-01-19 NOTE — Therapy (Signed)
OUTPATIENT NEURO PHYSICAL THERAPY TREATMENT   Patient Name: Kendra [name] MRN: [id number] DOB:[birth year], 77 y.o., female Today's Date: 01/20/2023  PCP: [place] Crews, MD REFERRING PROVIDER: [place] Crews, MD  END OF SESSION:  PT End of Session - 01/19/23 1628     Visit Number 23    Number of Visits 39    Date for PT Re-Evaluation 03/16/23    Authorization Type [employer] PPO    Authorization Time Period 11/04/2022-12/30/2022    Progress Note Due on Visit 30    PT Start Time 1304    PT Stop Time 1345    PT Time Calculation (min) 41 min    Equipment Utilized During Treatment Gait belt    Activity Tolerance Patient tolerated treatment well    Behavior During Therapy WFL for tasks assessed/performed                      Past Medical History:  Diagnosis Date   Anxiety    Breast cancer ([place]) 03/2018   right breast cancer at 11:00 and 1:00   Bursitis    bilateral hips and knees   Complication of anesthesia    Depression    Family history of breast cancer 03/24/2018   Family history of lung cancer 03/24/2018   History of alcohol dependence ([place]) 2007   no ETOH; resolved since 2007   History of hiatal hernia    History of psychosis 2014   due to sleep disturbance   Hypertension    Parkinson disease    Personal history of radiation therapy 06/2018-07/2018   right breast ca   PONV (postoperative nausea and vomiting)    Past Surgical History:  Procedure Laterality Date   BREAST BIOPSY Right 03/14/2018   11:00 DCIS and invasive ductal carcinoma   BREAST BIOPSY Right 03/14/2018   1:00 Invasive ductal carcinoma   BREAST LUMPECTOMY Right 04/27/2018   lumpectomy of 11 and 1:00 cancers, clear margins, negative LN   BREAST LUMPECTOMY WITH RADIOACTIVE SEED AND SENTINEL LYMPH NODE BIOPSY Right 04/27/2018   Procedure: RIGHT BREAST LUMPECTOMY WITH RADIOACTIVE SEED X 2 AND RIGHT SENTINEL LYMPH NODE BIOPSY;  Surgeon: [name], MD;   Location: [facility];  Service: General;  Laterality: Right;   CATARACT EXTRACTION W/PHACO Left 08/30/2019   Procedure: CATARACT EXTRACTION PHACO AND INTRAOCULAR LENS PLACEMENT ([name]) LEFT panoptix toric  01:20.5  12.7%  10.26;  Surgeon: Leandrew Koyanagi, MD;  Location: [facility];  Service: Ophthalmology;  Laterality: Left;   CATARACT EXTRACTION W/PHACO Right 09/20/2019   Procedure: CATARACT EXTRACTION PHACO AND INTRAOCULAR LENS PLACEMENT ([facility]) RIGHT 5.71  00:36.4  15.7%;  Surgeon: Leandrew Koyanagi, MD;  Location: [facility];  Service: Ophthalmology;  Laterality: Right;   LAPAROTOMY N/A 02/10/2018   Procedure: EXPLORATORY LAPAROTOMY;  Surgeon: Isabel Caprice, MD;  Location: [facility];  Service: Gynecology;  Laterality: N/A;   MASS EXCISION  01/2018   abdominal   OVARIAN CYST REMOVAL     SALPINGOOPHORECTOMY Bilateral 02/10/2018   Procedure: BILATERAL SALPINGO OOPHORECTOMY; PERITONEAL WASHINGS;  Surgeon: Isabel Caprice, MD;  Location: [facility];  Service: Gynecology;  Laterality: Bilateral;   TONSILLECTOMY     TONSILLECTOMY AND ADENOIDECTOMY  1953   Patient Active Problem List   Diagnosis Date Noted   Failure to attend appointment 12/17/2022   Prediabetes 12/15/2022   Chronic cough 08/17/2022   History of allergy to shellfish 02/24/2022   Chronic low back pain of over 3  months duration 02/04/2022   Osteoarthritis of  AC (acromioclavicular) joint (Left) 02/04/2022   Osteoarthritis of hip (Right) 02/04/2022   Impaction fracture of distal end of radius and ulna, sequela (Left) 02/04/2022   Osteoarthritis of 1st (thumb) carpometacarpal (CMC) joint of hand (Left) 02/04/2022   Lumbosacral facet arthropathy (Multilevel) (Bilateral) (T12-S1) 02/04/2022   Lumbar central spinal stenosis w/o neurogenic claudication (L2-3, L3-4, L4-5) 02/04/2022   Lumbar foraminal stenosis (Bilateral: L2-3, L4-5) (Right: (Severe) L4-5) 02/04/2022   Lumbar lateral recess stenosis  (Bilateral: L2-3, L3-4, and L4-5) (Severe) 02/04/2022   DDD (degenerative disc disease), lumbar 02/04/2022   Lumbosacral facet syndrome (Multilevel) (Bilateral) 02/04/2022   Osteoarthritis of lumbar spine 02/04/2022   Allergic conjunctivitis of both eyes 12/23/2021   Chronic hip pain (3ry area of Pain) (Bilateral) (L>R) 12/22/2021   Chronic lower extremity pain (2ry area of Pain) (Bilateral) (L>R) 12/22/2021   Abnormal MRI, lumbar spine (01/31/2020) 12/22/2021   Grade 1 Anterolisthesis of lumbar spine (L3/L4) & (5 mm) (L4/L5) 12/22/2021   Chronic pain syndrome 11/17/2021   High risk medication use 11/17/2021   Disorder of skeletal system 11/17/2021   Problems influencing health status 11/17/2021   Other hyperlipidemia 10/21/2021   Polypharmacy 08/12/2021   Chronic low back pain (1ry area of Pain) (Bilateral) (L>R) w/o sciatica 06/18/2021   Osteopenia of neck of femur (Right) 09/06/2020   Obesity 08/29/2020   Parkinson disease 02/22/2020   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   Bipolar disorder, in full remission, most recent episode depressed ([place]) 11/08/2019   Vitamin D insufficiency 08/24/2019   Insomnia due to mental condition 05/31/2019   Bipolar I disorder, most recent episode depressed ([place]) 05/31/2019   Alcohol use disorder, moderate, in sustained remission ([place]) 05/31/2019   Elevated tumor markers 05/27/2019   GAD (generalized anxiety disorder) 04/10/2019   MDD (major depressive disorder), severe ([place]) 01/31/2019   High serum carbohydrate antigen 19-9 (CA19-9) 12/01/2018   Adjustment disorder with anxious mood 11/24/2018   Essential hypertension 11/24/2018   B12 deficiency 10/17/2018   Incisional hernia, without obstruction or gangrene 09/12/2018   Genetic testing 04/07/2018   Family history of breast cancer 03/24/2018   Family history of lung cancer 03/24/2018   Malignant neoplasm of upper-outer quadrant of right breast in female, estrogen receptor positive ([place]) 03/17/2018    Malignant neoplasm of upper-inner quadrant of right breast in female, estrogen receptor positive ([place]) 03/17/2018   History of psychosis 02/07/2018   History of insomnia 02/07/2018    ONSET DATE: Pt diagnosed with PD in 2019  REFERRING DIAG:  G20 (ICD-10-CM) - Parkinson's disease      THERAPY DIAG:  Pain in left hip  Muscle weakness (generalized)  Difficulty in walking, not elsewhere classified  Unsteadiness on feet  Dizziness and giddiness  Rationale for Evaluation and Treatment: Rehabilitation  SUBJECTIVE: Pt returns after absence from PT, pt reports was scheduling error, reports gap in her monthly schedule. She continues to have hip pain, has recently had more imaging completed (please refer to updated diagnostics section below). She reports she has upcoming doctor's appointment to discuss imaging.  Pain: no pain currently Pt accompanied by: significant other self  PERTINENT HISTORY:  Pt is a pleasant 77 yo female who was diagnosed with PD in 2019. She is known to clinic and [facility], and was last seen for LSVT BIG Sept-Oct 2022. Pt with chronic pain and has become increasingly sedentary as a result exacerbating mobility difficulties due to PD. Current concerns: Pt reports L and R hip pain and back pain. Pt was being seen by pain management clinic. Pt has had injections that provided relief at the time. Pt's spouse reports her physician determined surgery was not an option for her pain so the plan is to manage it. Pt spouse thinks pain has resulted in reluctance for pt to move. Pt currently sedentary, doesn't walk much, but when she does she holds on to furniture for support due to balance problems. When pt first stands she must hold on to something due to mix of pain and/or dizziness (described as lightheadedness). Her pain can go from 3/10 to 9/10. It does improve once she starts moving. Pt has increased  difficulty with multiple activities including: getting in and out of her bed, getting in and out of her car, freezing, decreased gait speed, decreased balance and cognition, and difficulty using BUE due to tremors.  Per chart other PMH includes hx of breast cancer, HTN, MCI, insomnia, cataracts, bipolar 1, alcohol use disorder, GAD, MDD, osteopenia of neck of R femur, chronic B low back pain with radiating pain to buttock and posterior thighs and calves. Pt with multiple comorbidities, please refer to chart for full details    PAIN: from eval Are you having pain? Yes: NPRS scale: not rated currently but can reach 9/10 Pain location: L hip and L leg Pain description: pt has difficulty describing Aggravating factors: unsure Relieving factors: laying in the recliner helps, movement  PRECAUTIONS: Fall  WEIGHT BEARING RESTRICTIONS: No  FALLS: Has patient fallen in last 6 months? Yes. Number of falls 1x, leg tangled in chair leg when getting up from a chair  PLOF: Independent with basic ADLs  PATIENT GOALS: improve mobility   BLE Strength Assessment      Right  Left  Hip Flexion 5/5 4/5 (painful)  Hip Horizontal ABDCT  5/5 5/5  Hip Horizontal ADD 4+/5 4+/5  Hip IR (seated)  4+/5 4+/5  Hip ER (seated) 5/5 5/5  Knee Extension 5/5 5/5  Knee Flexion (seated) 4+/5  5/5 (not painful)  Hip ABDCT (supine)  3+/5 4-/5   Focal popliteal edema Left knee, not painful   ROM Assessment Hip     Right  Left   Flexion Supine 132 120 (ouch)  External rotation (90/90) 30 47  Internal Rotation (90/90) 40  20  Supine Hip Extension P/ROM >0 degrees >0 degrees       IMAGING: via chart   07/24/22 DG BONE density " ASSESSMENT: The probability of a major osteoporotic fracture is 15.8% within the next ten years.   The probability of a hip fracture is 2.9% within the next ten years."   12/22/21 DG lumbar spine " IMPRESSION: 1. No acute displaced fracture or traumatic listhesis of the  lumbar spine. 2. Interval development of grade 1 anterolisthesis of L3 on L4. Stable grade 1 anterolisthesis of L4 on L5. Appears stable on flexion extension views. 3.  Aortic Atherosclerosis (ICD10-I70.0)."   New from chart:  DG Lumbar spine 01/05/23: "IMPRESSION: 1. Lumbar spine scoliosis concave right. Diffuse multilevel degenerative change. Disc degeneration most prominent at L2-L3  and L5-S1. 5 mm anterolisthesis L4 on L5. Diffuse severe facet hypertrophy. No flexion or extension abnormality identified. No evidence of fracture. 2. Sclerotic densities are noted in the L2 and L4 vertebral bodies. Although these could represent bone islands blastic metastatic disease cannot be excluded. MRI of the lumbar spine suggested for further evaluation. 3. Large amount of stool noted throughout the colon. 4. Aortoiliac atherosclerotic vascular disease."    DG hip 01/05/23: "IMPRESSION: Moderate bilateral femoroacetabular osteoarthritis, similar to prior."   PATIENT EDUCATION:  Education details: Pt educated throughout session about proper posture and technique with exercises. Improved exercise technique, movement at target joints, use of target muscles after min to mod verbal, visual, tactile cues. LSVT BIG interventions Person educated: Patient and Spouse Education method: Explanation, Demonstration, and Verbal cues Education comprehension: verbalized understanding, returned demonstration, verbal cues required, tactile cues required, and needs further education   TODAY'S TREATMENT:                                                                                                                                         DATE: 01/19/2023   Amb. with 5OY 1x148 ft. Required seated rest break. Amb with 4WW 1x148 ft with 2# AW. Required 2 seated rest breaks. STS 2x5 Seated hamstring stretch 2x30 sec BLE  Seated figure 4 stretch 30 sec each LE  Standing side step and reach out with open hand 15x  each side UUE support. Fatiguing  Amb with 4WW 1x148 ft with 2# AW each LE. One rest break due to fatigue. Intervention immediately followed by the following: -STS 1x5 hands-free -LAQ 15x each LE  Goal retesting initiated for recert, see goal section below for details     HOME EXERCISE PROGRAM:   Access Code: [email or web address] URL: [email or web address] Date: 12/22/2022 Prepared by: Ricard Dillon  Exercises - Sit to Stand with Counter Support  - 1 x daily - 5 x weekly - 3 sets - 10 reps  GOALS: Goals reviewed with patient? Yes  SHORT TERM GOALS: Target date: 10/27/2022    Patient will be independent in home exercise program to improve strength/mobility for better functional independence with ADLs. Baseline: LSVT BIG protocol to be initiated next session; 12/18: not yet fully indep, reports she still gets confused about the technique; 12/03/2022: HEP to be updated; Pt reports practicing HEP 2x/week Goal status: IN PROGRESS   LONG TERM GOALS: Target date: 11/11/2022   Patient will increase FOTO score to equal to or greater than 49 to demonstrate statistically significant improvement in mobility and quality of life.  Baseline: 40; 12/18: deferred due to time; 45; 3/19: deferred to next visit Goal status: PARTIALLY MET  2.  Patient (> 71 years old) will complete five times sit to stand test in < 15 seconds indicating decreased fall risk.  Baseline: 27 sec; 12/18 23 sec hands-free; 11/04/22: 24 sec hands free; 2/1/124: 22.7 sec use of  BUE, 21.3 sec hands-free; 01/19/23: 18 seconds hands-free Goal status: IN PROGRESS  3.  Patient will increase 10 meter walk test to >1.78m/s with WFL B step-length, upright posture for >90% of test to improve ease with gait and to override hypokentic and bradykinetic movement.  Baseline: 0.58 m/s with [id number]; 12/18: 0.7 m/s with [id number]; 11/04/22: 0.69 m/s with [id number];  12/03/22: 0.73 m/s with [id number]; 01/19/2023: 0.77 m/s with 4WW Goal status: IN PROGRESS  4.   Patient will reduce time to don jacket by at least 5 seconds in order to increase ease with dressing and override bradykinetic movement Baseline: 17 seconds; 12/18: 12 seconds Goal status: MET  5.  Pt will demo or report ability to open standard jar using only 1-2 attempts in order to exhibit improved functional mobility for ADLs.  Baseline: 5.7 seconds with multiple attempts to turn jar lid to open; 12/18: 2.8 sec opens in 2 turns Goal status: MET  6.  Patient will increase Berg Balance score by > 6 points to demonstrate decreased fall risk during functional activities. Baseline: to be tested next 1-2 visits; 12/03/2022: 44/56  Goal status: NEW  7.  Patient will report at least 4 days in a row with minimal to no L hip pain in order to improve QOL and ease/safety with functional mobility. Baseline: Pt reports pain majority of days in L hip; 12/03/2022: pt reports having hip pain about every day; 3/19: rates hip pain daily (being seen now by MD, new imaging)  Goal status: NEW  ASSESSMENT:  CLINICAL IMPRESSION: Goal reassessment initiated today for recert. Pt returns after absence due to scheduling error/gap in her schedule. Although pt now returning to PT, she shows improvement in 5xSTS and 10MWT score, indicating decrease in fall risk, improved BLE power, and improved gait speed. While pt shows progress, she reports daily hip pain (now with new imaging of lumbar spine and hip). Further testing to be completed next visit.Will continue to perform stretching interventions to address this deficit. Pt generally tolerated interventions well with no increase in hip or back pain. The pt will benefit from further skilled PT to improve strength, balance, gait and mobility.  OBJECTIVE IMPAIRMENTS: Abnormal gait, decreased balance, decreased cognition, decreased coordination, decreased endurance, decreased knowledge of use of DME, decreased mobility, difficulty walking, decreased strength, dizziness,  hypomobility, impaired UE functional use, improper body mechanics, postural dysfunction, and pain.   ACTIVITY LIMITATIONS: lifting, bending, standing, squatting, stairs, transfers, bed mobility, bathing, dressing, hygiene/grooming, and locomotion level  PARTICIPATION LIMITATIONS: meal prep, cleaning, laundry, medication management, personal finances, driving, shopping, community activity, and yard work  PERSONAL FACTORS: Age, Fitness, Sex, Time since onset of injury/illness/exacerbation, and 3+ comorbidities: PMH includes hx of breast cancer, HTN, MCI, insomnia, cataracts, bipolar 1, alcohol use disorder, GAD, MDD, osteopenia of neck of R femur, chronic B low back pain with radiating pain to buttock and posterior thighs and calves. Pt with multiple comorbidities, please refer to chart for full details  are also affecting patient's functional outcome.   REHAB POTENTIAL: Fair    CLINICAL DECISION MAKING: Evolving/moderate complexity  EVALUATION COMPLEXITY: High  PLAN:  PT FREQUENCY: 2x/week  PT DURATION: 8 weeks  PLANNED INTERVENTIONS: Therapeutic exercises, Therapeutic activity, Neuromuscular re-education, Balance training, Gait training, Patient/Family education, Self Care, Joint mobilization, Stair training, Vestibular training, Canalith repositioning, Visual/preceptual remediation/compensation, Orthotic/Fit training, DME instructions, Wheelchair mobility training, Spinal mobilization, Cryotherapy, Moist heat, Splintting, Taping, and Manual therapy  PLAN FOR NEXT SESSION: hamtrings loading, hip strength, mobility, endurance, gait  and balance   3:15 PM, 01/20/23  Physical Therapist - [place], [place] 01/20/2023, 3:15 PM

## 2023-01-21 ENCOUNTER — Ambulatory Visit: Payer: Medicare PPO

## 2023-01-21 DIAGNOSIS — M25552 Pain in left hip: Secondary | ICD-10-CM | POA: Diagnosis not present

## 2023-01-21 DIAGNOSIS — M6281 Muscle weakness (generalized): Secondary | ICD-10-CM

## 2023-01-21 DIAGNOSIS — R42 Dizziness and giddiness: Secondary | ICD-10-CM

## 2023-01-21 DIAGNOSIS — R2681 Unsteadiness on feet: Secondary | ICD-10-CM

## 2023-01-21 DIAGNOSIS — R262 Difficulty in walking, not elsewhere classified: Secondary | ICD-10-CM

## 2023-01-21 NOTE — Therapy (Signed)
OUTPATIENT NEURO PHYSICAL THERAPY TREATMENT   Patient Name: Kendra [name] MRN: [id number] DOB:Nov 28, 1945, 77 y.o., female Today's Date: 01/21/2023  PCP: Virginia Crews, MD REFERRING PROVIDER: Virginia Crews, MD  END OF SESSION:  PT End of Session - 01/21/23 1352     Visit Number 24    Number of Visits 82    Date for PT Re-Evaluation 03/16/23    Authorization Type [employer] PPO    Authorization Time Period 11/04/2022-12/30/2022    Progress Note Due on Visit 30    PT Start Time 1302    PT Stop Time 1346    PT Time Calculation (min) 44 min    Equipment Utilized During Treatment Gait belt    Activity Tolerance Patient tolerated treatment well;No increased pain    Behavior During Therapy [facility] for tasks assessed/performed                      Past Medical History:  Diagnosis Date   Anxiety    Breast cancer ([place]) 03/2018   right breast cancer at 11:00 and 1:00   Bursitis    bilateral hips and knees   Complication of anesthesia    Depression    Family history of breast cancer 03/24/2018   Family history of lung cancer 03/24/2018   History of alcohol dependence ([place]) 2007   no ETOH; resolved since 2007   History of hiatal hernia    History of psychosis 2014   due to sleep disturbance   Hypertension    Parkinson disease    Personal history of radiation therapy 06/2018-07/2018   right breast ca   PONV (postoperative nausea and vomiting)    Past Surgical History:  Procedure Laterality Date   BREAST BIOPSY Right 03/14/2018   11:00 DCIS and invasive ductal carcinoma   BREAST BIOPSY Right 03/14/2018   1:00 Invasive ductal carcinoma   BREAST LUMPECTOMY Right 04/27/2018   lumpectomy of 11 and 1:00 cancers, clear margins, negative LN   BREAST LUMPECTOMY WITH RADIOACTIVE SEED AND SENTINEL LYMPH NODE BIOPSY Right 04/27/2018   Procedure: RIGHT BREAST LUMPECTOMY WITH RADIOACTIVE SEED X 2 AND RIGHT SENTINEL LYMPH NODE BIOPSY;  Surgeon: [name], MD;  Location: [place];  Service: General;  Laterality: Right;   CATARACT EXTRACTION W/PHACO Left 08/30/2019   Procedure: CATARACT EXTRACTION PHACO AND INTRAOCULAR LENS PLACEMENT ([place]) LEFT panoptix toric  01:20.5  12.7%  10.26;  Surgeon: Leandrew Koyanagi, MD;  Location: [place];  Service: Ophthalmology;  Laterality: Left;   CATARACT EXTRACTION W/PHACO Right 09/20/2019   Procedure: CATARACT EXTRACTION PHACO AND INTRAOCULAR LENS PLACEMENT ([facility]) RIGHT 5.71  00:36.4  15.7%;  Surgeon: Leandrew Koyanagi, MD;  Location: [place];  Service: Ophthalmology;  Laterality: Right;   LAPAROTOMY N/A 02/10/2018   Procedure: EXPLORATORY LAPAROTOMY;  Surgeon: Isabel Caprice, MD;  Location: [facility];  Service: Gynecology;  Laterality: N/A;   MASS EXCISION  01/2018   abdominal   OVARIAN CYST REMOVAL     SALPINGOOPHORECTOMY Bilateral 02/10/2018   Procedure: BILATERAL SALPINGO OOPHORECTOMY; PERITONEAL WASHINGS;  Surgeon: Isabel Caprice, MD;  Location: [facility];  Service: Gynecology;  Laterality: Bilateral;   TONSILLECTOMY     TONSILLECTOMY AND ADENOIDECTOMY  1953   Patient Active Problem List   Diagnosis Date Noted   Failure to attend appointment 12/17/2022   Prediabetes 12/15/2022   Chronic cough 08/17/2022   History of allergy to shellfish 02/24/2022   Chronic low back pain of  over 3 months duration 02/04/2022   Osteoarthritis of  AC (acromioclavicular) joint (Left) 02/04/2022   Osteoarthritis of hip (Right) 02/04/2022   Impaction fracture of distal end of radius and ulna, sequela (Left) 02/04/2022   Osteoarthritis of 1st (thumb) carpometacarpal (CMC) joint of hand (Left) 02/04/2022   Lumbosacral facet arthropathy (Multilevel) (Bilateral) (T12-S1) 02/04/2022   Lumbar central spinal stenosis w/o neurogenic claudication (L2-3, L3-4, L4-5) 02/04/2022   Lumbar foraminal stenosis (Bilateral: L2-3, L4-5) (Right: (Severe) L4-5) 02/04/2022   Lumbar lateral  recess stenosis (Bilateral: L2-3, L3-4, and L4-5) (Severe) 02/04/2022   DDD (degenerative disc disease), lumbar 02/04/2022   Lumbosacral facet syndrome (Multilevel) (Bilateral) 02/04/2022   Osteoarthritis of lumbar spine 02/04/2022   Allergic conjunctivitis of both eyes 12/23/2021   Chronic hip pain (3ry area of Pain) (Bilateral) (L>R) 12/22/2021   Chronic lower extremity pain (2ry area of Pain) (Bilateral) (L>R) 12/22/2021   Abnormal MRI, lumbar spine (01/31/2020) 12/22/2021   Grade 1 Anterolisthesis of lumbar spine (L3/L4) & (5 mm) (L4/L5) 12/22/2021   Chronic pain syndrome 11/17/2021   High risk medication use 11/17/2021   Disorder of skeletal system 11/17/2021   Problems influencing health status 11/17/2021   Other hyperlipidemia 10/21/2021   Polypharmacy 08/12/2021   Chronic low back pain (1ry area of Pain) (Bilateral) (L>R) w/o sciatica 06/18/2021   Osteopenia of neck of femur (Right) 09/06/2020   Obesity 08/29/2020   Parkinson disease 02/22/2020   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   Bipolar disorder, in full remission, most recent episode depressed ([place]) 11/08/2019   Vitamin D insufficiency 08/24/2019   Insomnia due to mental condition 05/31/2019   Bipolar I disorder, most recent episode depressed ([place]) 05/31/2019   Alcohol use disorder, moderate, in sustained remission ([place]) 05/31/2019   Elevated tumor markers 05/27/2019   GAD (generalized anxiety disorder) 04/10/2019   MDD (major depressive disorder), severe ([place]) 01/31/2019   High serum carbohydrate antigen 19-9 (CA19-9) 12/01/2018   Adjustment disorder with anxious mood 11/24/2018   Essential hypertension 11/24/2018   B12 deficiency 10/17/2018   Incisional hernia, without obstruction or gangrene 09/12/2018   Genetic testing 04/07/2018   Family history of breast cancer 03/24/2018   Family history of lung cancer 03/24/2018   Malignant neoplasm of upper-outer quadrant of right breast in female, estrogen receptor positive  ([place]) 03/17/2018   Malignant neoplasm of upper-inner quadrant of right breast in female, estrogen receptor positive ([place]) 03/17/2018   History of psychosis 02/07/2018   History of insomnia 02/07/2018    ONSET DATE: Pt diagnosed with PD in 2019  REFERRING DIAG:  G20 (ICD-10-CM) - Parkinson's disease      THERAPY DIAG:  Pain in left hip  Muscle weakness (generalized)  Difficulty in walking, not elsewhere classified  Unsteadiness on feet  Dizziness and giddiness  Rationale for Evaluation and Treatment: Rehabilitation  SUBJECTIVE: Pt reports moderate hip pain. She reports no falls.                                                                             Pain: no pain currently Pt accompanied by: significant other self  PERTINENT HISTORY:  Pt is a pleasant 77 yo female who was diagnosed with PD in 2019. She is known  to clinic and [facility], and was last seen for LSVT BIG Sept-Oct 2022. Pt with chronic pain and has become increasingly sedentary as a result exacerbating mobility difficulties due to PD. Current concerns: Pt reports L and R hip pain and back pain. Pt was being seen by pain management clinic. Pt has had injections that provided relief at the time. Pt's spouse reports her physician determined surgery was not an option for her pain so the plan is to manage it. Pt spouse thinks pain has resulted in reluctance for pt to move. Pt currently sedentary, doesn't walk much, but when she does she holds on to furniture for support due to balance problems. When pt first stands she must hold on to something due to mix of pain and/or dizziness (described as lightheadedness). Her pain can go from 3/10 to 9/10. It does improve once she starts moving. Pt has increased difficulty with multiple activities including: getting in and out of her bed, getting in and out of her car, freezing, decreased gait speed, decreased balance and cognition, and difficulty using BUE due to tremors.  Per chart  other PMH includes hx of breast cancer, HTN, MCI, insomnia, cataracts, bipolar 1, alcohol use disorder, GAD, MDD, osteopenia of neck of R femur, chronic B low back pain with radiating pain to buttock and posterior thighs and calves. Pt with multiple comorbidities, please refer to chart for full details    PAIN: from eval Are you having pain? Yes: NPRS scale: not rated currently but can reach 9/10 Pain location: L hip and L leg Pain description: pt has difficulty describing Aggravating factors: unsure Relieving factors: laying in the recliner helps, movement  PRECAUTIONS: Fall  WEIGHT BEARING RESTRICTIONS: No  FALLS: Has patient fallen in last 6 months? Yes. Number of falls 1x, leg tangled in chair leg when getting up from a chair  PLOF: Independent with basic ADLs  PATIENT GOALS: improve mobility   BLE Strength Assessment      Right  Left  Hip Flexion 5/5 4/5 (painful)  Hip Horizontal ABDCT  5/5 5/5  Hip Horizontal ADD 4+/5 4+/5  Hip IR (seated)  4+/5 4+/5  Hip ER (seated) 5/5 5/5  Knee Extension 5/5 5/5  Knee Flexion (seated) 4+/5  5/5 (not painful)  Hip ABDCT (supine)  3+/5 4-/5   Focal popliteal edema Left knee, not painful   ROM Assessment Hip     Right  Left   Flexion Supine 132 120 (ouch)  External rotation (90/90) 30 47  Internal Rotation (90/90) 40  20  Supine Hip Extension P/ROM >0 degrees >0 degrees       IMAGING: via chart   07/24/22 DG BONE density " ASSESSMENT: The probability of a major osteoporotic fracture is 15.8% within the next ten years.   The probability of a hip fracture is 2.9% within the next ten years."   12/22/21 DG lumbar spine " IMPRESSION: 1. No acute displaced fracture or traumatic listhesis of the lumbar spine. 2. Interval development of grade 1 anterolisthesis of L3 on L4. Stable grade 1 anterolisthesis of L4 on L5. Appears stable on flexion extension views. 3.  Aortic Atherosclerosis (ICD10-I70.0)."   New from chart:  DG  Lumbar spine 01/05/23: "IMPRESSION: 1. Lumbar spine scoliosis concave right. Diffuse multilevel degenerative change. Disc degeneration most prominent at L2-L3 and L5-S1. 5 mm anterolisthesis L4 on L5. Diffuse severe facet hypertrophy. No flexion or extension abnormality identified. No evidence of fracture. 2. Sclerotic densities are noted in the L2 and  L4 vertebral bodies. Although these could represent bone islands blastic metastatic disease cannot be excluded. MRI of the lumbar spine suggested for further evaluation. 3. Large amount of stool noted throughout the colon. 4. Aortoiliac atherosclerotic vascular disease."    DG hip 01/05/23: "IMPRESSION: Moderate bilateral femoroacetabular osteoarthritis, similar to prior."   PATIENT EDUCATION:  Education details: Pt educated throughout session about proper posture and technique with exercises. Improved exercise technique, movement at target joints, use of target muscles after min to mod verbal, visual, tactile cues. LSVT BIG interventions Person educated: Patient and Spouse Education method: Explanation, Demonstration, and Verbal cues Education comprehension: verbalized understanding, returned demonstration, verbal cues required, tactile cues required, and needs further education   TODAY'S TREATMENT:                                                                                                                                         DATE: 01/19/2023  NMR: Merrilee Jansky: 45/56  TE: Superset: 3 rounds: Amb with 4WW 1x148 ft with 2.5# AW. STS 5x Rates medium, requires rest breaks due to fatigue  Seated hamstring stretch 2x30 sec BLE  Seated figure 4 stretch 2x30 sec each LE    Nustep endurance training lvl 1-2-3-2-1 in 60 sec rounds. Cuing for intensity throughout. CGA-min a for mount/dismount.     HOME EXERCISE PROGRAM:   Access Code: [email or web address] URL: [email or web address] Date: 12/22/2022 Prepared by: Ricard Dillon  Exercises - Sit to Stand with Counter Support  - 1 x daily - 5 x weekly - 3 sets - 10 reps  GOALS: Goals reviewed with patient? Yes  SHORT TERM GOALS: Target date: 10/27/2022    Patient will be independent in home exercise program to improve strength/mobility for better functional independence with ADLs. Baseline: LSVT BIG protocol to be initiated next session; 12/18: not yet fully indep, reports she still gets confused about the technique; 12/03/2022: HEP to be updated; Pt reports practicing HEP 2x/week Goal status: IN PROGRESS   LONG TERM GOALS: Target date: 11/11/2022   Patient will increase FOTO score to equal to or greater than 49 to demonstrate statistically significant improvement in mobility and quality of life.  Baseline: 40; 12/18: deferred due to time; 45; 3/19: deferred to next visit; 01/21/2023 Goal status: PARTIALLY MET  2.  Patient (> 98 years old) will complete five times sit to stand test in < 15 seconds indicating decreased fall risk.  Baseline: 27 sec; 12/18 23 sec hands-free; 11/04/22: 24 sec hands free; 2/1/124: 22.7 sec use of BUE, 21.3 sec hands-free; 01/19/23: 18 seconds hands-free Goal status: IN PROGRESS  3.  Patient will increase 10 meter walk test to >1.53m/s with WFL B step-length, upright posture for >90% of test to improve ease with gait and to override hypokentic and bradykinetic movement.  Baseline: 0.58 m/s with [id number]; 12/18: 0.7 m/s with [id number]; 11/04/22: 0.69 m/s with [id number];  12/03/22: 0.73 m/s  with [id number]; 01/19/2023: 0.77 m/s with [id number] Goal status: IN PROGRESS  4.  Patient will reduce time to don jacket by at least 5 seconds in order to increase ease with dressing and override bradykinetic movement Baseline: 17 seconds; 12/18: 12 seconds Goal status: MET  5.  Pt will demo or report ability to open standard jar using only 1-2 attempts in order to exhibit improved functional mobility for ADLs.  Baseline: 5.7 seconds with multiple attempts to turn jar lid to  open; 12/18: 2.8 sec opens in 2 turns Goal status: MET  6.  Patient will increase Berg Balance score by > 6 points to demonstrate decreased fall risk during functional activities. Baseline: to be tested next 1-2 visits; 12/03/2022: 44/56; 01/21/23 45/56 Goal status: In Progress  7.  Patient will report at least 4 days in a row with minimal to no L hip pain in order to improve QOL and ease/safety with functional mobility. Baseline: Pt reports pain majority of days in L hip; 12/03/2022: pt reports having hip pain about every day; 3/19: rates hip pain daily (being seen now by MD, new imaging)  Goal status: In Progress  ASSESSMENT:  CLINICAL IMPRESSION: Goal reassessment completed. Pt with similar BERG score compared to last assessment, although she did improve by 1 point. This indicates pt still at increased risk for a future fall. She had greatest difficulty with SLB-based activities. Pt otherwise does show progress with improved activity tolerance, completing superset in session. The pt will benefit from further skilled PT to improve strength, balance, gait and mobility.  OBJECTIVE IMPAIRMENTS: Abnormal gait, decreased balance, decreased cognition, decreased coordination, decreased endurance, decreased knowledge of use of DME, decreased mobility, difficulty walking, decreased strength, dizziness, hypomobility, impaired UE functional use, improper body mechanics, postural dysfunction, and pain.   ACTIVITY LIMITATIONS: lifting, bending, standing, squatting, stairs, transfers, bed mobility, bathing, dressing, hygiene/grooming, and locomotion level  PARTICIPATION LIMITATIONS: meal prep, cleaning, laundry, medication management, personal finances, driving, shopping, community activity, and yard work  PERSONAL FACTORS: Age, Fitness, Sex, Time since onset of injury/illness/exacerbation, and 3+ comorbidities: PMH includes hx of breast cancer, HTN, MCI, insomnia, cataracts, bipolar 1, alcohol use disorder,  GAD, MDD, osteopenia of neck of R femur, chronic B low back pain with radiating pain to buttock and posterior thighs and calves. Pt with multiple comorbidities, please refer to chart for full details  are also affecting patient's functional outcome.   REHAB POTENTIAL: Fair    CLINICAL DECISION MAKING: Evolving/moderate complexity  EVALUATION COMPLEXITY: High  PLAN:  PT FREQUENCY: 2x/week  PT DURATION: 8 weeks  PLANNED INTERVENTIONS: Therapeutic exercises, Therapeutic activity, Neuromuscular re-education, Balance training, Gait training, Patient/Family education, Self Care, Joint mobilization, Stair training, Vestibular training, Canalith repositioning, Visual/preceptual remediation/compensation, Orthotic/Fit training, DME instructions, Wheelchair mobility training, Spinal mobilization, Cryotherapy, Moist heat, Splintting, Taping, and Manual therapy  PLAN FOR NEXT SESSION: hamtrings loading, hip strength, mobility, endurance, gait and balance   1:55 PM, 01/21/23  Physical Therapist - [place]ers [place], PT 01/21/2023, 1:55 PM

## 2023-01-24 NOTE — Progress Notes (Unsigned)
PROVIDER NOTE: Information contained herein reflects review and annotations entered in association with encounter. Interpretation of such information and data should be left to medically-trained personnel. Information provided to patient can be located elsewhere in the medical record under "Patient Instructions". Document created using STT-dictation technology, any transcriptional errors that may result from process are unintentional.    Patient: Kendra [name]  Service Category: E/M  Provider: Gaspar Cola, MD  DOB: 1946-06-11  DOS: 01/26/2023  Referring Provider: Virginia Crews, MD  MRN: [id number]  Specialty: Interventional Pain Management  PCP: Virginia Crews, MD  Type: Established Patient  Setting: Ambulatory outpatient    Location: Office  Delivery: Face-to-face     HPI  Kendra [name], a 77 y.o. year old female, is here today because of her No primary diagnosis found.. Kendra [name] primary complain today is No chief complaint on file.  Pertinent problems: Kendra [name] has Malignant neoplasm of upper-outer quadrant of right breast in female, estrogen receptor positive ([place]); Malignant neoplasm of upper-inner quadrant of right breast in female, estrogen receptor positive ([place]); Mucinous cystadenoma; Parkinson disease; Osteopenia of neck of femur (Right); Chronic low back pain (1ry area of Pain) (Bilateral) (L>R) w/o sciatica; Chronic pain syndrome; Chronic hip pain (3ry area of Pain) (Bilateral) (L>R); Chronic lower extremity pain (2ry area of Pain) (Bilateral) (L>R); Abnormal MRI, lumbar spine (01/31/2020); Grade 1 Anterolisthesis of lumbar spine (L3/L4) & (5 mm) (L4/L5); Chronic low back pain of over 3 months duration; Osteoarthritis of  AC (acromioclavicular) joint (Left); Osteoarthritis of hip (Right); Impaction fracture of distal end of radius and ulna, sequela (Left); Osteoarthritis of 1st (thumb) carpometacarpal (CMC) joint of hand (Left); Lumbosacral facet arthropathy  (Multilevel) (Bilateral) (T12-S1); Lumbar central spinal stenosis w/o neurogenic claudication (L2-3, L3-4, L4-5); Lumbar foraminal stenosis (Bilateral: L2-3, L4-5) (Right: (Severe) L4-5); Lumbar lateral recess stenosis (Bilateral: L2-3, L3-4, and L4-5) (Severe); DDD (degenerative disc disease), lumbar; Lumbosacral facet syndrome (Multilevel) (Bilateral); and Osteoarthritis of lumbar spine on their pertinent problem list. Pain Assessment: Severity of   is reported as a  /10. Location:    / . Onset:  . Quality:  . Timing:  . Modifying factor(s):  Marland Kitchen Vitals:  vitals were not taken for this visit.  BMI: Estimated body mass index is 36.48 kg/m as calculated from the following:   Height as of 01/12/23: 5\' 6"  (1.676 m).   Weight as of 01/12/23: 226 lb (102.5 kg). Last encounter: 01/04/2023. Last procedure: 01/12/2023.  Reason for encounter:  *** . ***  Pharmacotherapy Assessment  Analgesic: Tramadol 50 mg tablet, 1 tab p.o. twice daily MME/day: 10 mg/day   Monitoring: [place] PMP: PDMP reviewed during this encounter.       Pharmacotherapy: No side-effects or adverse reactions reported. Compliance: No problems identified. Effectiveness: Clinically acceptable.  No notes on file  No results found for: "CBDTHCR" No results found for: "D8THCCBX" No results found for: "D9THCCBX"  UDS:  Summary  Date Value Ref Range Status  12/22/2021 Note  Final    Comment:    ==================================================================== Compliance Drug Analysis, Ur ==================================================================== Test                             Result       Flag       Units  Drug Present and Declared for Prescription Verification   Tramadol                       >  5155        EXPECTED   ng/mg creat   O-Desmethyltramadol            1528         EXPECTED   ng/mg creat   N-Desmethyltramadol            4768         EXPECTED   ng/mg creat    Source of tramadol is a prescription medication.  O-desmethyltramadol    and N-desmethyltramadol are expected metabolites of tramadol.    Gabapentin                     PRESENT      EXPECTED   Fluoxetine                     PRESENT      EXPECTED   Norfluoxetine                  PRESENT      EXPECTED    Norfluoxetine is an expected metabolite of fluoxetine.    Trazodone                      PRESENT      EXPECTED   1,3 chlorophenyl piperazine    PRESENT      EXPECTED    1,3-chlorophenyl piperazine is an expected metabolite of trazodone.    Risperidone                    PRESENT      EXPECTED  Drug Absent but Declared for Prescription Verification   Acetaminophen                  Not Detected UNEXPECTED    Acetaminophen, as indicated in the declared medication list, is not    always detected even when used as directed.    Diclofenac                     Not Detected UNEXPECTED    Diclofenac, as indicated in the declared medication list, is not    always detected even when used as directed.  ==================================================================== Test                      Result    Flag   Units      Ref Range   Creatinine              97               mg/dL      >=20 ==================================================================== Declared Medications:  The flagging and interpretation on this report are based on the  following declared medications.  Unexpected results may arise from  inaccuracies in the declared medications.   **Note: The testing scope of this panel includes these medications:   Fluoxetine  Gabapentin (Neurontin)  Risperidone (Risperdal)  Tramadol (Ultram)  Trazodone (Desyrel)   **Note: The testing scope of this panel does not include small to  moderate amounts of these reported medications:   Acetaminophen  Diclofenac (Voltaren)   **Note: The testing scope of this panel does not include the  following reported medications:   Carbidopa (Sinemet)  Denosumab (Prolia)  Hydrochlorothiazide  (Zestoretic)  Letrozole (Femara)  Levodopa (Sinemet)  Lisinopril (Zestoretic)  Meloxicam (Mobic)  Nystatin  Polyethylene Glycol  Vitamin B12 ==================================================================== For clinical consultation, please call [phone]. ====================================================================  ROS  Constitutional: Denies any fever or chills Gastrointestinal: No reported hemesis, hematochezia, vomiting, or acute GI distress Musculoskeletal: Denies any acute onset joint swelling, redness, loss of ROM, or weakness Neurological: No reported episodes of acute onset apraxia, aphasia, dysarthria, agnosia, amnesia, paralysis, loss of coordination, or loss of consciousness  Medication Review  Acetaminophen, Carbidopa-Levodopa ER, FLUoxetine HCl, carbidopa-levodopa, cyanocobalamin, denosumab, gabapentin, letrozole, lisinopril-hydrochlorothiazide, risperiDONE, and traZODone  History Review  Allergy: Ms. Humphrey is allergic to hydroxyzine, other, and shrimp [shellfish allergy]. Drug: Ms. Holck  reports no history of drug use. Alcohol:  reports that she does not currently use alcohol. Tobacco:  reports that she has never smoked. She has never used smokeless tobacco. Social: Ms. Betanzos  reports that she has never smoked. She has never used smokeless tobacco. She reports that she does not currently use alcohol. She reports that she does not use drugs. Medical:  has a past medical history of Anxiety, Breast cancer ([place]) (03/2018), Bursitis, Complication of anesthesia, Depression, Family history of breast cancer (03/24/2018), Family history of lung cancer (03/24/2018), History of alcohol dependence ([place]) (2007), History of hiatal hernia, History of psychosis (2014), Hypertension, Parkinson disease, Personal history of radiation therapy (06/2018-07/2018), and PONV (postoperative nausea and vomiting). Surgical: Ms. Chiarella  has a past surgical history that  includes Ovarian cyst removal; Tonsillectomy and adenoidectomy (1953); Tonsillectomy; laparotomy (N/A, 02/10/2018); Salpingoophorectomy (Bilateral, 02/10/2018); Mass excision (01/2018); Breast lumpectomy with radioactive seed and sentinel lymph node biopsy (Right, 04/27/2018); Cataract extraction w/PHACO (Left, 08/30/2019); Cataract extraction w/PHACO (Right, 09/20/2019); Breast biopsy (Right, 03/14/2018); Breast biopsy (Right, 03/14/2018); and Breast lumpectomy (Right, 04/27/2018). Family: family history includes Breast cancer (age of onset: 73) in her cousin; Breast cancer (age of onset: 73) in her mother; Diabetes in her mother; Heart disease in her paternal grandfather; Heart disease (age of onset: 41) in her brother; Heart disease (age of onset: 52) in her paternal grandmother; Hypertension in her mother; Lung cancer in her father.  Laboratory Chemistry Profile   Renal Lab Results  Component Value Date   BUN 9 11/06/2022   CREATININE 0.75 11/06/2022   BCR 12 11/06/2022   GFRAA >60 04/29/2020   GFRNONAA >60 08/28/2022    Hepatic Lab Results  Component Value Date   AST 17 11/06/2022   ALT 26 11/06/2022   ALBUMIN 4.4 11/06/2022   ALKPHOS 37 (L) 11/06/2022   HCVAB <0.1 06/01/2019    Electrolytes Lab Results  Component Value Date   NA 138 11/06/2022   K 4.0 11/06/2022   CL 98 11/06/2022   CALCIUM 9.6 11/06/2022   MG 2.0 12/22/2021    Bone Lab Results  Component Value Date   VD25OH 15.6 (L) 11/06/2022   TESTOSTERONE 39 06/20/2015    Inflammation (CRP: Acute Phase) (ESR: Chronic Phase) Lab Results  Component Value Date   CRP 2 12/22/2021   ESRSEDRATE 20 12/22/2021         Note: Above Lab results reviewed.  Recent Imaging Review  DG PAIN CLINIC C-ARM 1-60 MIN NO REPORT Fluoro was used, but no Radiologist interpretation will be provided.  Please refer to "NOTES" tab for provider progress note. Note: Reviewed        Physical Exam  General appearance: Well nourished, well  developed, and well hydrated. In no apparent acute distress Mental status: Alert, oriented x 3 (person, place, & time)       Respiratory: No evidence of acute respiratory distress Eyes: PERLA Vitals: There were no vitals taken for this visit. BMI: Estimated  body mass index is 36.48 kg/m as calculated from the following:   Height as of 01/12/23: 5\' 6"  (1.676 m).   Weight as of 01/12/23: 226 lb (102.5 kg). Ideal: Ideal body weight: 59.3 kg (130 lb 11.7 oz) Adjusted ideal body weight: 76.6 kg (168 lb 13.4 oz)  Assessment   Diagnosis Status  No diagnosis found. Controlled Controlled Controlled   Updated Problems: No problems updated.  Plan of Care  Problem-specific:  No problem-specific Assessment & Plan notes found for this encounter.  Ms. Macelyn Zulli has a current medication list which includes the following long-term medication(s): carbidopa-levodopa, carbidopa-levodopa er, fluoxetine hcl, gabapentin, lisinopril-hydrochlorothiazide, risperidone, and trazodone.  Pharmacotherapy (Medications Ordered): No orders of the defined types were placed in this encounter.  Orders:  No orders of the defined types were placed in this encounter.  Follow-up plan:   No follow-ups on file.      Interventional Therapies  Risk  Complexity Considerations:   Allergy to shellfish    Planned  Pending:   Therapeutic left L2-3 vs L3-4 LESI #2  Diagnostic x-rays of the left hip  Diagnostic x-rays of the lumbar spine with flexion views    Under consideration:   Diagnostic left L2-3 LESI #2  Diagnostic midline L3-4 LESI #1  Diagnostic bilateral lumbar facet MBB #1    Completed:   Diagnostic/diagnostic left L2-3 LESI x1 (02/24/2022) (100/100/75/75)    Completed by other providers:   (06/27/2021) bilateral L5-S1 transforaminal ESI (75% relief) by Sharlet Salina, DO (08/14/2021) left L5-S1 transforaminal ESI + left S1 TFESI by Sharlet Salina, DO   Therapeutic  Palliative (PRN)  options:   None established        Recent Visits Date Type Provider Dept  01/12/23 Procedure visit Milinda Pointer, MD [facility]  01/04/23 Office Visit Milinda Pointer, MD [facility]  Showing recent visits within past 90 days and meeting all other requirements Future Appointments Date Type Provider Dept  01/26/23 Appointment Milinda Pointer, MD [facility]  Showing future appointments within next 90 days and meeting all other requirements  I discussed the assessment and treatment plan with the patient. The patient was provided an opportunity to ask questions and all were answered. The patient agreed with the plan and demonstrated an understanding of the instructions.  Patient advised to call back or seek an in-person evaluation if the symptoms or condition worsens.  Duration of encounter: *** minutes.  Total time on encounter, as per AMA guidelines included both the face-to-face and non-face-to-face time personally spent by the physician and/or other qualified health care professional(s) on the day of the encounter (includes time in activities that require the physician or other qualified health care professional and does not include time in activities normally performed by clinical staff). Physician's time may include the following activities when performed: Preparing to see the patient (e.g., pre-charting review of records, searching for previously ordered imaging, lab work, and nerve conduction tests) Review of prior analgesic pharmacotherapies. Reviewing PMP Interpreting ordered tests (e.g., lab work, imaging, nerve conduction tests) Performing post-procedure evaluations, including interpretation of diagnostic procedures Obtaining and/or reviewing separately obtained history Performing a medically appropriate examination and/or evaluation Counseling and educating the patient/family/caregiver Ordering medications, tests, or  procedures Referring and communicating with other health care professionals (when not separately reported) Documenting clinical information in the electronic or other health record Independently interpreting results (not separately reported) and communicating results to the patient/ family/caregiver Care coordination (not separately reported)  Note by: Kathlen Brunswick  Dossie Arbour, MD Date: 01/26/2023; Time: 6:42 PM

## 2023-01-26 ENCOUNTER — Encounter: Payer: Self-pay | Admitting: Pain Medicine

## 2023-01-26 ENCOUNTER — Other Ambulatory Visit: Payer: Self-pay

## 2023-01-26 ENCOUNTER — Ambulatory Visit: Payer: Medicare PPO | Attending: Pain Medicine | Admitting: Pain Medicine

## 2023-01-26 ENCOUNTER — Ambulatory Visit: Payer: Medicare PPO

## 2023-01-26 VITALS — BP 137/95 | HR 71 | Temp 97.2°F | Resp 18 | Ht 66.0 in | Wt 221.0 lb

## 2023-01-26 DIAGNOSIS — M25551 Pain in right hip: Secondary | ICD-10-CM | POA: Insufficient documentation

## 2023-01-26 DIAGNOSIS — M25552 Pain in left hip: Secondary | ICD-10-CM | POA: Diagnosis not present

## 2023-01-26 DIAGNOSIS — M545 Low back pain, unspecified: Secondary | ICD-10-CM

## 2023-01-26 DIAGNOSIS — M79605 Pain in left leg: Secondary | ICD-10-CM

## 2023-01-26 DIAGNOSIS — G8929 Other chronic pain: Secondary | ICD-10-CM | POA: Diagnosis not present

## 2023-01-26 DIAGNOSIS — M79604 Pain in right leg: Secondary | ICD-10-CM | POA: Diagnosis not present

## 2023-01-26 NOTE — Progress Notes (Signed)
Safety precautions to be maintained throughout the outpatient stay will include: orient to surroundings, keep bed in low position, maintain call bell within reach at all times, provide assistance with transfer out of bed and ambulation.  

## 2023-01-28 ENCOUNTER — Ambulatory Visit: Payer: Medicare PPO | Attending: Radiation Oncology | Admitting: Radiation Oncology

## 2023-02-02 ENCOUNTER — Ambulatory Visit: Payer: Medicare PPO | Attending: Family Medicine

## 2023-02-02 ENCOUNTER — Telehealth: Payer: Self-pay | Admitting: Psychiatry

## 2023-02-02 DIAGNOSIS — F5105 Insomnia due to other mental disorder: Secondary | ICD-10-CM

## 2023-02-02 DIAGNOSIS — R278 Other lack of coordination: Secondary | ICD-10-CM | POA: Diagnosis not present

## 2023-02-02 DIAGNOSIS — F411 Generalized anxiety disorder: Secondary | ICD-10-CM

## 2023-02-02 DIAGNOSIS — M6281 Muscle weakness (generalized): Secondary | ICD-10-CM

## 2023-02-02 DIAGNOSIS — M25552 Pain in left hip: Secondary | ICD-10-CM | POA: Diagnosis not present

## 2023-02-02 DIAGNOSIS — Z79899 Other long term (current) drug therapy: Secondary | ICD-10-CM

## 2023-02-02 DIAGNOSIS — R42 Dizziness and giddiness: Secondary | ICD-10-CM | POA: Insufficient documentation

## 2023-02-02 DIAGNOSIS — R2681 Unsteadiness on feet: Secondary | ICD-10-CM | POA: Diagnosis not present

## 2023-02-02 DIAGNOSIS — F3176 Bipolar disorder, in full remission, most recent episode depressed: Secondary | ICD-10-CM

## 2023-02-02 DIAGNOSIS — R262 Difficulty in walking, not elsewhere classified: Secondary | ICD-10-CM

## 2023-02-02 NOTE — Telephone Encounter (Signed)
Kendra [name] could you please check if we can download a DME request form .

## 2023-02-02 NOTE — Telephone Encounter (Signed)
left message that i would need to know where and what kind of machine they were getting. and that normally I don't do thoses because the provider usually writes rx and you take it there and they process it.

## 2023-02-02 NOTE — Telephone Encounter (Signed)
[facility] [facility] dispenser. Please let me know once you find out what we can do to assist this patient.

## 2023-02-02 NOTE — Telephone Encounter (Signed)
Patient spouse stopped by office. Requesting an automated medication dispenser. Insurance is requesting a DME request for coverage. States this was mentioned at a previous appointment and a referral was made but think now it has expired. Is it possible to request another?

## 2023-02-02 NOTE — Therapy (Signed)
OUTPATIENT NEURO PHYSICAL THERAPY TREATMENT   Patient Name: Kendra [name] MRN: [id number] DOB:04/03/1946, 77 y.o., female Today's Date: 02/02/2023  PCP: [place] Crews, MD REFERRING PROVIDER: [place] Crews, MD  END OF SESSION:  PT End of Session - 02/02/23 1716     Visit Number 25    Number of Visits 41    Date for PT Re-Evaluation 03/16/23    Authorization Type [employer] PPO    Authorization Time Period 11/04/2022-12/30/2022    Progress Note Due on Visit 30    PT Start Time 1302    PT Stop Time 1345    PT Time Calculation (min) 43 min    Equipment Utilized During Treatment Gait belt    Activity Tolerance Patient tolerated treatment well;Patient limited by pain;Patient limited by fatigue    Behavior During Therapy [facility] for tasks assessed/performed                      Past Medical History:  Diagnosis Date   Anxiety    Breast cancer 03/2018   right breast cancer at 11:00 and 1:00   Bursitis    bilateral hips and knees   Complication of anesthesia    Depression    Family history of breast cancer 03/24/2018   Family history of lung cancer 03/24/2018   History of alcohol dependence 2007   no ETOH; resolved since 2007   History of hiatal hernia    History of psychosis 2014   due to sleep disturbance   Hypertension    Parkinson disease    Personal history of radiation therapy 06/2018-07/2018   right breast ca   PONV (postoperative nausea and vomiting)    Past Surgical History:  Procedure Laterality Date   BREAST BIOPSY Right 03/14/2018   11:00 DCIS and invasive ductal carcinoma   BREAST BIOPSY Right 03/14/2018   1:00 Invasive ductal carcinoma   BREAST LUMPECTOMY Right 04/27/2018   lumpectomy of 11 and 1:00 cancers, clear margins, negative LN   BREAST LUMPECTOMY WITH RADIOACTIVE SEED AND SENTINEL LYMPH NODE BIOPSY Right 04/27/2018   Procedure: RIGHT BREAST LUMPECTOMY WITH RADIOACTIVE SEED X 2 AND RIGHT SENTINEL LYMPH NODE BIOPSY;   Surgeon: [name], MD;  Location: [place];  Service: General;  Laterality: Right;   CATARACT EXTRACTION W/PHACO Left 08/30/2019   Procedure: CATARACT EXTRACTION PHACO AND INTRAOCULAR LENS PLACEMENT ([place]) LEFT panoptix toric  01:20.5  12.7%  10.26;  Surgeon: Leandrew Koyanagi, MD;  Location: [place];  Service: Ophthalmology;  Laterality: Left;   CATARACT EXTRACTION W/PHACO Right 09/20/2019   Procedure: CATARACT EXTRACTION PHACO AND INTRAOCULAR LENS PLACEMENT ([facility]) RIGHT 5.71  00:36.4  15.7%;  Surgeon: Leandrew Koyanagi, MD;  Location: [place];  Service: Ophthalmology;  Laterality: Right;   LAPAROTOMY N/A 02/10/2018   Procedure: EXPLORATORY LAPAROTOMY;  Surgeon: Isabel Caprice, MD;  Location: [facility];  Service: Gynecology;  Laterality: N/A;   MASS EXCISION  01/2018   abdominal   OVARIAN CYST REMOVAL     SALPINGOOPHORECTOMY Bilateral 02/10/2018   Procedure: BILATERAL SALPINGO OOPHORECTOMY; PERITONEAL WASHINGS;  Surgeon: Isabel Caprice, MD;  Location: [facility];  Service: Gynecology;  Laterality: Bilateral;   TONSILLECTOMY     TONSILLECTOMY AND ADENOIDECTOMY  1953   Patient Active Problem List   Diagnosis Date Noted   Failure to attend appointment 12/17/2022   Prediabetes 12/15/2022   Chronic cough 08/17/2022   History of allergy to shellfish 02/24/2022   Chronic low back  pain of over 3 months duration 02/04/2022   Osteoarthritis of  AC (acromioclavicular) joint (Left) 02/04/2022   Osteoarthritis of hip (Right) 02/04/2022   Impaction fracture of distal end of radius and ulna, sequela (Left) 02/04/2022   Osteoarthritis of 1st (thumb) carpometacarpal (CMC) joint of hand (Left) 02/04/2022   Lumbosacral facet arthropathy (Multilevel) (Bilateral) (T12-S1) 02/04/2022   Lumbar central spinal stenosis w/o neurogenic claudication (L2-3, L3-4, L4-5) 02/04/2022   Lumbar foraminal stenosis (Bilateral: L2-3, L4-5) (Right: (Severe) L4-5) 02/04/2022    Lumbar lateral recess stenosis (Bilateral: L2-3, L3-4, and L4-5) (Severe) 02/04/2022   DDD (degenerative disc disease), lumbar 02/04/2022   Lumbosacral facet syndrome (Multilevel) (Bilateral) 02/04/2022   Osteoarthritis of lumbar spine 02/04/2022   Allergic conjunctivitis of both eyes 12/23/2021   Chronic hip pain (3ry area of Pain) (Bilateral) (L>R) 12/22/2021   Chronic lower extremity pain (2ry area of Pain) (Bilateral) (L>R) 12/22/2021   Abnormal MRI, lumbar spine (01/31/2020) 12/22/2021   Grade 1 Anterolisthesis of lumbar spine (L3/L4) & (5 mm) (L4/L5) 12/22/2021   Chronic pain syndrome 11/17/2021   High risk medication use 11/17/2021   Disorder of skeletal system 11/17/2021   Problems influencing health status 11/17/2021   Other hyperlipidemia 10/21/2021   Polypharmacy 08/12/2021   Chronic low back pain (1ry area of Pain) (Bilateral) (L>R) w/o sciatica 06/18/2021   Osteopenia of neck of femur (Right) 09/06/2020   Obesity 08/29/2020   Parkinson disease 02/22/2020   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   Bipolar disorder, in full remission, most recent episode depressed 11/08/2019   Vitamin D insufficiency 08/24/2019   Insomnia due to mental condition 05/31/2019   Bipolar I disorder, most recent episode depressed 05/31/2019   Alcohol use disorder, moderate, in sustained remission 05/31/2019   Elevated tumor markers 05/27/2019   GAD (generalized anxiety disorder) 04/10/2019   MDD (major depressive disorder), severe 01/31/2019   High serum carbohydrate antigen 19-9 (CA19-9) 12/01/2018   Adjustment disorder with anxious mood 11/24/2018   Essential hypertension 11/24/2018   B12 deficiency 10/17/2018   Incisional hernia, without obstruction or gangrene 09/12/2018   Genetic testing 04/07/2018   Family history of breast cancer 03/24/2018   Family history of lung cancer 03/24/2018   Malignant neoplasm of upper-outer quadrant of right breast in female, estrogen receptor positive 03/17/2018    Malignant neoplasm of upper-inner quadrant of right breast in female, estrogen receptor positive 03/17/2018   History of psychosis 02/07/2018   History of insomnia 02/07/2018    ONSET DATE: Pt diagnosed with PD in 2019  REFERRING DIAG:  G20 (ICD-10-CM) - Parkinson's disease      THERAPY DIAG:  Muscle weakness (generalized)  Difficulty in walking, not elsewhere classified  Pain in left hip  Other lack of coordination  Unsteadiness on feet  Dizziness and giddiness  Rationale for Evaluation and Treatment: Rehabilitation  SUBJECTIVE: Pt reports no updates currently except she doesn't fully feel she has her balance today. She says this occurs when she first stands up. Pt continues to have hip pain but does report it feels a bit better.                                                                            Pain: no  pain currently Pt accompanied by: significant other self  PERTINENT HISTORY:  Pt is a pleasant 77 yo female who was diagnosed with PD in 2019. She is known to clinic and [facility], and was last seen for LSVT BIG Sept-Oct 2022. Pt with chronic pain and has become increasingly sedentary as a result exacerbating mobility difficulties due to PD. Current concerns: Pt reports L and R hip pain and back pain. Pt was being seen by pain management clinic. Pt has had injections that provided relief at the time. Pt's spouse reports her physician determined surgery was not an option for her pain so the plan is to manage it. Pt spouse thinks pain has resulted in reluctance for pt to move. Pt currently sedentary, doesn't walk much, but when she does she holds on to furniture for support due to balance problems. When pt first stands she must hold on to something due to mix of pain and/or dizziness (described as lightheadedness). Her pain can go from 3/10 to 9/10. It does improve once she starts moving. Pt has increased difficulty with multiple activities including: getting in and out of  her bed, getting in and out of her car, freezing, decreased gait speed, decreased balance and cognition, and difficulty using BUE due to tremors.  Per chart other PMH includes hx of breast cancer, HTN, MCI, insomnia, cataracts, bipolar 1, alcohol use disorder, GAD, MDD, osteopenia of neck of R femur, chronic B low back pain with radiating pain to buttock and posterior thighs and calves. Pt with multiple comorbidities, please refer to chart for full details    PAIN: from eval Are you having pain? Yes: NPRS scale: not rated currently but can reach 9/10 Pain location: L hip and L leg Pain description: pt has difficulty describing Aggravating factors: unsure Relieving factors: laying in the recliner helps, movement  PRECAUTIONS: Fall  WEIGHT BEARING RESTRICTIONS: No  FALLS: Has patient fallen in last 6 months? Yes. Number of falls 1x, leg tangled in chair leg when getting up from a chair  PLOF: Independent with basic ADLs  PATIENT GOALS: improve mobility   BLE Strength Assessment      Right  Left  Hip Flexion 5/5 4/5 (painful)  Hip Horizontal ABDCT  5/5 5/5  Hip Horizontal ADD 4+/5 4+/5  Hip IR (seated)  4+/5 4+/5  Hip ER (seated) 5/5 5/5  Knee Extension 5/5 5/5  Knee Flexion (seated) 4+/5  5/5 (not painful)  Hip ABDCT (supine)  3+/5 4-/5   Focal popliteal edema Left knee, not painful   ROM Assessment Hip     Right  Left   Flexion Supine 132 120 (ouch)  External rotation (90/90) 30 47  Internal Rotation (90/90) 40  20  Supine Hip Extension P/ROM >0 degrees >0 degrees       IMAGING: via chart   07/24/22 DG BONE density " ASSESSMENT: The probability of a major osteoporotic fracture is 15.8% within the next ten years.   The probability of a hip fracture is 2.9% within the next ten years."   12/22/21 DG lumbar spine " IMPRESSION: 1. No acute displaced fracture or traumatic listhesis of the lumbar spine. 2. Interval development of grade 1 anterolisthesis of L3 on  L4. Stable grade 1 anterolisthesis of L4 on L5. Appears stable on flexion extension views. 3.  Aortic Atherosclerosis (ICD10-I70.0)."   New from chart:  DG Lumbar spine 01/05/23: "IMPRESSION: 1. Lumbar spine scoliosis concave right. Diffuse multilevel degenerative change. Disc degeneration most prominent at L2-L3 and L5-S1.  5 mm anterolisthesis L4 on L5. Diffuse severe facet hypertrophy. No flexion or extension abnormality identified. No evidence of fracture. 2. Sclerotic densities are noted in the L2 and L4 vertebral bodies. Although these could represent bone islands blastic metastatic disease cannot be excluded. MRI of the lumbar spine suggested for further evaluation. 3. Large amount of stool noted throughout the colon. 4. Aortoiliac atherosclerotic vascular disease."    DG hip 01/05/23: "IMPRESSION: Moderate bilateral femoroacetabular osteoarthritis, similar to prior."   PATIENT EDUCATION:  Education details: Pt educated throughout session about proper posture and technique with exercises. Improved exercise technique, movement at target joints, use of target muscles after min to mod verbal, visual, tactile cues. LSVT BIG interventions Person educated: Patient and Spouse Education method: Explanation, Demonstration, and Verbal cues Education comprehension: verbalized understanding, returned demonstration, verbal cues required, tactile cues required, and needs further education   TODAY'S TREATMENT:                                                                                                                                         DATE: 01/19/2023  TE:  Three rounds of the following to promote LE mm endurance and cardioresp endurance:  Amb with 4WW 1x296 ft followed by 10 STS  Brief rest breaks taken within superset with pt request due to fatigue/dizziness (chronic issue). This improves quickly with rest.  Pt rates medium HR around 103-105 bpm with intervention   BP  assessment following intervention: 127/87 mmHg HR 83 bpm   Seated hamstring stretch 2x30 sec BLE  Seated figure 4 stretch 2x30 sec each LE   NMR:  SLB progression: one foot on floor one on 6" step  - attempted one rep with RLE on step. Terminated as pt reported increased hip pain with intervention. She reports immediate improvement out of position.  Tandem stance with UUE support 30 sec each LE       HOME EXERCISE PROGRAM:   Access Code: [email or web address] URL: [email or web address] Date: 12/22/2022 Prepared by: Ricard Dillon  Exercises - Sit to Stand with Counter Support  - 1 x daily - 5 x weekly - 3 sets - 10 reps  GOALS: Goals reviewed with patient? Yes  SHORT TERM GOALS: Target date: 10/27/2022    Patient will be independent in home exercise program to improve strength/mobility for better functional independence with ADLs. Baseline: LSVT BIG protocol to be initiated next session; 12/18: not yet fully indep, reports she still gets confused about the technique; 12/03/2022: HEP to be updated; Pt reports practicing HEP 2x/week Goal status: IN PROGRESS   LONG TERM GOALS: Target date: 11/11/2022   Patient will increase FOTO score to equal to or greater than 49 to demonstrate statistically significant improvement in mobility and quality of life.  Baseline: 40; 12/18: deferred due to time; 45; 3/19: deferred to next visit; 01/21/2023 Goal status: PARTIALLY MET  2.  Patient (> 60 years  old) will complete five times sit to stand test in < 15 seconds indicating decreased fall risk.  Baseline: 27 sec; 12/18 23 sec hands-free; 11/04/22: 24 sec hands free; 2/1/124: 22.7 sec use of BUE, 21.3 sec hands-free; 01/19/23: 18 seconds hands-free Goal status: IN PROGRESS  3.  Patient will increase 10 meter walk test to >1.70m/s with WFL B step-length, upright posture for >90% of test to improve ease with gait and to override hypokentic and bradykinetic movement.  Baseline: 0.58 m/s with  [id number]; 12/18: 0.7 m/s with [id number]; 11/04/22: 0.69 m/s with [id number];  12/03/22: 0.73 m/s with [id number]; 01/19/2023: 0.77 m/s with 4WW Goal status: IN PROGRESS  4.  Patient will reduce time to don jacket by at least 5 seconds in order to increase ease with dressing and override bradykinetic movement Baseline: 17 seconds; 12/18: 12 seconds Goal status: MET  5.  Pt will demo or report ability to open standard jar using only 1-2 attempts in order to exhibit improved functional mobility for ADLs.  Baseline: 5.7 seconds with multiple attempts to turn jar lid to open; 12/18: 2.8 sec opens in 2 turns Goal status: MET  6.  Patient will increase Berg Balance score by > 6 points to demonstrate decreased fall risk during functional activities. Baseline: to be tested next 1-2 visits; 12/03/2022: 44/56; 01/21/23 45/56 Goal status: In Progress  7.  Patient will report at least 4 days in a row with minimal to no L hip pain in order to improve QOL and ease/safety with functional mobility. Baseline: Pt reports pain majority of days in L hip; 12/03/2022: pt reports having hip pain about every day; 3/19: rates hip pain daily (being seen now by MD, new imaging)  Goal status: In Progress  ASSESSMENT:  CLINICAL IMPRESSION: Pt ability to progress SLB currently limited due to hip pain. Pt was able to tolerate tandem stance without pain. Continued focus on progressing pt's endurance. Pt still limited by fatigue, but recovers quickly with brief seated rest.The pt will benefit from further skilled PT to improve strength, balance, gait and mobility.  OBJECTIVE IMPAIRMENTS: Abnormal gait, decreased balance, decreased cognition, decreased coordination, decreased endurance, decreased knowledge of use of DME, decreased mobility, difficulty walking, decreased strength, dizziness, hypomobility, impaired UE functional use, improper body mechanics, postural dysfunction, and pain.   ACTIVITY LIMITATIONS: lifting, bending, standing, squatting, stairs,  transfers, bed mobility, bathing, dressing, hygiene/grooming, and locomotion level  PARTICIPATION LIMITATIONS: meal prep, cleaning, laundry, medication management, personal finances, driving, shopping, community activity, and yard work  PERSONAL FACTORS: Age, Fitness, Sex, Time since onset of injury/illness/exacerbation, and 3+ comorbidities: PMH includes hx of breast cancer, HTN, MCI, insomnia, cataracts, bipolar 1, alcohol use disorder, GAD, MDD, osteopenia of neck of R femur, chronic B low back pain with radiating pain to buttock and posterior thighs and calves. Pt with multiple comorbidities, please refer to chart for full details  are also affecting patient's functional outcome.   REHAB POTENTIAL: Fair    CLINICAL DECISION MAKING: Evolving/moderate complexity  EVALUATION COMPLEXITY: High  PLAN:  PT FREQUENCY: 2x/week  PT DURATION: 8 weeks  PLANNED INTERVENTIONS: Therapeutic exercises, Therapeutic activity, Neuromuscular re-education, Balance training, Gait training, Patient/Family education, Self Care, Joint mobilization, Stair training, Vestibular training, Canalith repositioning, Visual/preceptual remediation/compensation, Orthotic/Fit training, DME instructions, Wheelchair mobility training, Spinal mobilization, Cryotherapy, Moist heat, Splintting, Taping, and Manual therapy  PLAN FOR NEXT SESSION: hamtrings loading, hip strength, mobility, endurance, gait and balance   5:21 PM, 02/02/23 Ricard Dillon PT, DPT  Physical Therapist -  [place], [place] 02/02/2023, 5:21 PM

## 2023-02-03 ENCOUNTER — Encounter: Payer: Self-pay | Admitting: Psychiatry

## 2023-02-03 MED ORDER — AMBULATORY NON FORMULARY MEDICATION: 1.0000 | Freq: Every day | 0 refills | Status: AC

## 2023-02-03 NOTE — Telephone Encounter (Signed)
spoke with a gentelman that states that they don't file with insurance that they can try to file their own claim to [place] and see if the insurance will reminburse them for some of the cost.

## 2023-02-03 NOTE — Telephone Encounter (Signed)
I have printed out a prescription for HERO smart dispenser.  I have also printed out a supporting letter for this patient.  Janett Billow CMA to assist.

## 2023-02-03 NOTE — Telephone Encounter (Signed)
Went online to [employer].  Eligibility criteria for DME Medicare coverage To get your DME covered by Medicare, it must meet the following 2 conditions:  First, your prescriber should issue a prescription or order for the device. Your healthcare provider must state in the document that you need the equipment to help with a medical condition and the equipment is for home use. Next, you must take your order or prescription to a Medicare-approved supplier to get the device covered.4

## 2023-02-03 NOTE — Telephone Encounter (Signed)
left message with information that i have found out and asked to give our office a call back.

## 2023-02-03 NOTE — Telephone Encounter (Signed)
rep told me that it would be under durable medical equipment and that the pill dispenser is not on the list of covered equipment.

## 2023-02-04 ENCOUNTER — Ambulatory Visit: Payer: Medicare PPO

## 2023-02-04 DIAGNOSIS — R2681 Unsteadiness on feet: Secondary | ICD-10-CM | POA: Diagnosis not present

## 2023-02-04 DIAGNOSIS — R262 Difficulty in walking, not elsewhere classified: Secondary | ICD-10-CM | POA: Diagnosis not present

## 2023-02-04 DIAGNOSIS — R278 Other lack of coordination: Secondary | ICD-10-CM | POA: Diagnosis not present

## 2023-02-04 DIAGNOSIS — R42 Dizziness and giddiness: Secondary | ICD-10-CM | POA: Diagnosis not present

## 2023-02-04 DIAGNOSIS — M25552 Pain in left hip: Secondary | ICD-10-CM | POA: Diagnosis not present

## 2023-02-04 DIAGNOSIS — M6281 Muscle weakness (generalized): Secondary | ICD-10-CM

## 2023-02-04 NOTE — Therapy (Signed)
OUTPATIENT NEURO PHYSICAL THERAPY TREATMENT   Patient Name: Kendra [name] MRN: [id number] DOB:September 23, 1946, 77 y.o., female Today's Date: 02/04/2023  PCP: Virginia Crews, MD REFERRING PROVIDER: Virginia Crews, MD  END OF SESSION:  PT End of Session - 02/04/23 1354     Visit Number 26    Number of Visits 39    Date for PT Re-Evaluation 03/16/23    Authorization Type [employer] PPO    Authorization Time Period 11/04/2022-12/30/2022    Progress Note Due on Visit 30    PT Start Time 1301    PT Stop Time 1342    PT Time Calculation (min) 41 min    Equipment Utilized During Treatment Gait belt    Activity Tolerance Patient tolerated treatment well;Patient limited by fatigue    Behavior During Therapy WFL for tasks assessed/performed                      Past Medical History:  Diagnosis Date   Anxiety    Breast cancer 03/2018   right breast cancer at 11:00 and 1:00   Bursitis    bilateral hips and knees   Complication of anesthesia    Depression    Family history of breast cancer 03/24/2018   Family history of lung cancer 03/24/2018   History of alcohol dependence 2007   no ETOH; resolved since 2007   History of hiatal hernia    History of psychosis 2014   due to sleep disturbance   Hypertension    Parkinson disease    Personal history of radiation therapy 06/2018-07/2018   right breast ca   PONV (postoperative nausea and vomiting)    Past Surgical History:  Procedure Laterality Date   BREAST BIOPSY Right 03/14/2018   11:00 DCIS and invasive ductal carcinoma   BREAST BIOPSY Right 03/14/2018   1:00 Invasive ductal carcinoma   BREAST LUMPECTOMY Right 04/27/2018   lumpectomy of 11 and 1:00 cancers, clear margins, negative LN   BREAST LUMPECTOMY WITH RADIOACTIVE SEED AND SENTINEL LYMPH NODE BIOPSY Right 04/27/2018   Procedure: RIGHT BREAST LUMPECTOMY WITH RADIOACTIVE SEED X 2 AND RIGHT SENTINEL LYMPH NODE BIOPSY;  Surgeon: [name], MD;  Location: [facility];  Service: General;  Laterality: Right;   CATARACT EXTRACTION W/PHACO Left 08/30/2019   Procedure: CATARACT EXTRACTION PHACO AND INTRAOCULAR LENS PLACEMENT ([place]) LEFT panoptix toric  01:20.5  12.7%  10.26;  Surgeon: Leandrew Koyanagi, MD;  Location: [facility];  Service: Ophthalmology;  Laterality: Left;   CATARACT EXTRACTION W/PHACO Right 09/20/2019   Procedure: CATARACT EXTRACTION PHACO AND INTRAOCULAR LENS PLACEMENT ([facility]) RIGHT 5.71  00:36.4  15.7%;  Surgeon: Leandrew Koyanagi, MD;  Location: [facility];  Service: Ophthalmology;  Laterality: Right;   LAPAROTOMY N/A 02/10/2018   Procedure: EXPLORATORY LAPAROTOMY;  Surgeon: Isabel Caprice, MD;  Location: [facility];  Service: Gynecology;  Laterality: N/A;   MASS EXCISION  01/2018   abdominal   OVARIAN CYST REMOVAL     SALPINGOOPHORECTOMY Bilateral 02/10/2018   Procedure: BILATERAL SALPINGO OOPHORECTOMY; PERITONEAL WASHINGS;  Surgeon: Isabel Caprice, MD;  Location: [facility];  Service: Gynecology;  Laterality: Bilateral;   TONSILLECTOMY     TONSILLECTOMY AND ADENOIDECTOMY  1953   Patient Active Problem List   Diagnosis Date Noted   Failure to attend appointment 12/17/2022   Prediabetes 12/15/2022   Chronic cough 08/17/2022   History of allergy to shellfish 02/24/2022   Chronic low back pain of over  3 months duration 02/04/2022   Osteoarthritis of  AC (acromioclavicular) joint (Left) 02/04/2022   Osteoarthritis of hip (Right) 02/04/2022   Impaction fracture of distal end of radius and ulna, sequela (Left) 02/04/2022   Osteoarthritis of 1st (thumb) carpometacarpal (CMC) joint of hand (Left) 02/04/2022   Lumbosacral facet arthropathy (Multilevel) (Bilateral) (T12-S1) 02/04/2022   Lumbar central spinal stenosis w/o neurogenic claudication (L2-3, L3-4, L4-5) 02/04/2022   Lumbar foraminal stenosis (Bilateral: L2-3, L4-5) (Right: (Severe) L4-5) 02/04/2022   Lumbar lateral  recess stenosis (Bilateral: L2-3, L3-4, and L4-5) (Severe) 02/04/2022   DDD (degenerative disc disease), lumbar 02/04/2022   Lumbosacral facet syndrome (Multilevel) (Bilateral) 02/04/2022   Osteoarthritis of lumbar spine 02/04/2022   Allergic conjunctivitis of both eyes 12/23/2021   Chronic hip pain (3ry area of Pain) (Bilateral) (L>R) 12/22/2021   Chronic lower extremity pain (2ry area of Pain) (Bilateral) (L>R) 12/22/2021   Abnormal MRI, lumbar spine (01/31/2020) 12/22/2021   Grade 1 Anterolisthesis of lumbar spine (L3/L4) & (5 mm) (L4/L5) 12/22/2021   Chronic pain syndrome 11/17/2021   High risk medication use 11/17/2021   Disorder of skeletal system 11/17/2021   Problems influencing health status 11/17/2021   Other hyperlipidemia 10/21/2021   Polypharmacy 08/12/2021   Chronic low back pain (1ry area of Pain) (Bilateral) (L>R) w/o sciatica 06/18/2021   Osteopenia of neck of femur (Right) 09/06/2020   Obesity 08/29/2020   Parkinson disease 02/22/2020   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   Bipolar disorder, in full remission, most recent episode depressed 11/08/2019   Vitamin D insufficiency 08/24/2019   Insomnia due to mental condition 05/31/2019   Bipolar I disorder, most recent episode depressed 05/31/2019   Alcohol use disorder, moderate, in sustained remission 05/31/2019   Elevated tumor markers 05/27/2019   GAD (generalized anxiety disorder) 04/10/2019   MDD (major depressive disorder), severe 01/31/2019   High serum carbohydrate antigen 19-9 (CA19-9) 12/01/2018   Adjustment disorder with anxious mood 11/24/2018   Essential hypertension 11/24/2018   B12 deficiency 10/17/2018   Incisional hernia, without obstruction or gangrene 09/12/2018   Genetic testing 04/07/2018   Family history of breast cancer 03/24/2018   Family history of lung cancer 03/24/2018   Malignant neoplasm of upper-outer quadrant of right breast in female, estrogen receptor positive 03/17/2018   Malignant  neoplasm of upper-inner quadrant of right breast in female, estrogen receptor positive 03/17/2018   History of psychosis 02/07/2018   History of insomnia 02/07/2018    ONSET DATE: Pt diagnosed with PD in 2019  REFERRING DIAG:  G20 (ICD-10-CM) - Parkinson's disease      THERAPY DIAG:  Muscle weakness (generalized)  Other lack of coordination  Unsteadiness on feet  Difficulty in walking, not elsewhere classified  Rationale for Evaluation and Treatment: Rehabilitation  SUBJECTIVE: Pt reports she is not in pain currently. Was a little sore after last visit. She reports no stumbles/falls.                                                                            Pain: no pain currently Pt accompanied by: significant other self  PERTINENT HISTORY:  Pt is a pleasant 77 yo female who was diagnosed with PD in 2019. She is known to clinic  and [facility], and was last seen for LSVT BIG Sept-Oct 2022. Pt with chronic pain and has become increasingly sedentary as a result exacerbating mobility difficulties due to PD. Current concerns: Pt reports L and R hip pain and back pain. Pt was being seen by pain management clinic. Pt has had injections that provided relief at the time. Pt's spouse reports her physician determined surgery was not an option for her pain so the plan is to manage it. Pt spouse thinks pain has resulted in reluctance for pt to move. Pt currently sedentary, doesn't walk much, but when she does she holds on to furniture for support due to balance problems. When pt first stands she must hold on to something due to mix of pain and/or dizziness (described as lightheadedness). Her pain can go from 3/10 to 9/10. It does improve once she starts moving. Pt has increased difficulty with multiple activities including: getting in and out of her bed, getting in and out of her car, freezing, decreased gait speed, decreased balance and cognition, and difficulty using BUE due to tremors.  Per chart  other PMH includes hx of breast cancer, HTN, MCI, insomnia, cataracts, bipolar 1, alcohol use disorder, GAD, MDD, osteopenia of neck of R femur, chronic B low back pain with radiating pain to buttock and posterior thighs and calves. Pt with multiple comorbidities, please refer to chart for full details    PAIN: from eval Are you having pain? Yes: NPRS scale: not rated currently but can reach 9/10 Pain location: L hip and L leg Pain description: pt has difficulty describing Aggravating factors: unsure Relieving factors: laying in the recliner helps, movement  PRECAUTIONS: Fall  WEIGHT BEARING RESTRICTIONS: No  FALLS: Has patient fallen in last 6 months? Yes. Number of falls 1x, leg tangled in chair leg when getting up from a chair  PLOF: Independent with basic ADLs  PATIENT GOALS: improve mobility   BLE Strength Assessment      Right  Left  Hip Flexion 5/5 4/5 (painful)  Hip Horizontal ABDCT  5/5 5/5  Hip Horizontal ADD 4+/5 4+/5  Hip IR (seated)  4+/5 4+/5  Hip ER (seated) 5/5 5/5  Knee Extension 5/5 5/5  Knee Flexion (seated) 4+/5  5/5 (not painful)  Hip ABDCT (supine)  3+/5 4-/5   Focal popliteal edema Left knee, not painful   ROM Assessment Hip     Right  Left   Flexion Supine 132 120 (ouch)  External rotation (90/90) 30 47  Internal Rotation (90/90) 40  20  Supine Hip Extension P/ROM >0 degrees >0 degrees       IMAGING: via chart   07/24/22 DG BONE density " ASSESSMENT: The probability of a major osteoporotic fracture is 15.8% within the next ten years.   The probability of a hip fracture is 2.9% within the next ten years."   12/22/21 DG lumbar spine " IMPRESSION: 1. No acute displaced fracture or traumatic listhesis of the lumbar spine. 2. Interval development of grade 1 anterolisthesis of L3 on L4. Stable grade 1 anterolisthesis of L4 on L5. Appears stable on flexion extension views. 3.  Aortic Atherosclerosis (ICD10-I70.0)."   New from chart:  DG  Lumbar spine 01/05/23: "IMPRESSION: 1. Lumbar spine scoliosis concave right. Diffuse multilevel degenerative change. Disc degeneration most prominent at L2-L3 and L5-S1. 5 mm anterolisthesis L4 on L5. Diffuse severe facet hypertrophy. No flexion or extension abnormality identified. No evidence of fracture. 2. Sclerotic densities are noted in the L2 and L4 vertebral  bodies. Although these could represent bone islands blastic metastatic disease cannot be excluded. MRI of the lumbar spine suggested for further evaluation. 3. Large amount of stool noted throughout the colon. 4. Aortoiliac atherosclerotic vascular disease."    DG hip 01/05/23: "IMPRESSION: Moderate bilateral femoroacetabular osteoarthritis, similar to prior."   PATIENT EDUCATION:  Education details: Pt educated throughout session about proper posture and technique with exercises. Improved exercise technique, movement at target joints, use of target muscles after min to mod verbal, visual, tactile cues. LSVT BIG interventions Person educated: Patient and Spouse Education method: Explanation, Demonstration, and Verbal cues Education comprehension: verbalized understanding, returned demonstration, verbal cues required, tactile cues required, and needs further education   TODAY'S TREATMENT:                                                                                                                                         DATE: 02/04/23   TE:  2.5# AW donned each LE Two rounds of the following to promote LE mm endurance and cardioresp endurance:  Amb with 4WW 1x296 ft followed by 10 STS  Brief rest breaks taken within superset with pt request due to fatigue/dizziness (chronic issue). This improves quickly with rest.  Pt continues to rate medium, but struggled to complete laps without brief rest break HR around 85-93 bpm with intervention   Seated hamstring stretch 2x30 sec BLE  Seated figure 4 stretch 2x30 sec each LE     2.5# AW donned, pt seated: LAQ 2x10 each LE  RTB hamstring curls 2x10 each LE. Rates medium  Large-amplitude technique for STS followed by bilat shoulder abduction with trunk twist to clap hands each direction 2x10  HR 86-88 bpm      HOME EXERCISE PROGRAM:   Access Code: [email or web address] URL: [email or web address] Date: 12/22/2022 Prepared by: Ricard Dillon  Exercises - Sit to Stand with Counter Support  - 1 x daily - 5 x weekly - 3 sets - 10 reps  GOALS: Goals reviewed with patient? Yes  SHORT TERM GOALS: Target date: 10/27/2022    Patient will be independent in home exercise program to improve strength/mobility for better functional independence with ADLs. Baseline: LSVT BIG protocol to be initiated next session; 12/18: not yet fully indep, reports she still gets confused about the technique; 12/03/2022: HEP to be updated; Pt reports practicing HEP 2x/week Goal status: IN PROGRESS   LONG TERM GOALS: Target date: 11/11/2022   Patient will increase FOTO score to equal to or greater than 49 to demonstrate statistically significant improvement in mobility and quality of life.  Baseline: 40; 12/18: deferred due to time; 45; 3/19: deferred to next visit; 01/21/2023 Goal status: PARTIALLY MET  2.  Patient (> 47 years old) will complete five times sit to stand test in < 15 seconds indicating decreased fall risk.  Baseline: 27 sec; 12/18 23 sec hands-free; 11/04/22: 24 sec hands free; 2/1/124:  22.7 sec use of BUE, 21.3 sec hands-free; 01/19/23: 18 seconds hands-free Goal status: IN PROGRESS  3.  Patient will increase 10 meter walk test to >1.15m/s with WFL B step-length, upright posture for >90% of test to improve ease with gait and to override hypokentic and bradykinetic movement.  Baseline: 0.58 m/s with [id number]; 12/18: 0.7 m/s with [id number]; 11/04/22: 0.69 m/s with [id number];  12/03/22: 0.73 m/s with [id number]; 01/19/2023: 0.77 m/s with 4WW Goal status: IN PROGRESS  4.  Patient will reduce time  to don jacket by at least 5 seconds in order to increase ease with dressing and override bradykinetic movement Baseline: 17 seconds; 12/18: 12 seconds Goal status: MET  5.  Pt will demo or report ability to open standard jar using only 1-2 attempts in order to exhibit improved functional mobility for ADLs.  Baseline: 5.7 seconds with multiple attempts to turn jar lid to open; 12/18: 2.8 sec opens in 2 turns Goal status: MET  6.  Patient will increase Berg Balance score by > 6 points to demonstrate decreased fall risk during functional activities. Baseline: to be tested next 1-2 visits; 12/03/2022: 44/56; 01/21/23 45/56 Goal status: In Progress  7.  Patient will report at least 4 days in a row with minimal to no L hip pain in order to improve QOL and ease/safety with functional mobility. Baseline: Pt reports pain majority of days in L hip; 12/03/2022: pt reports having hip pain about every day; 3/19: rates hip pain daily (being seen now by MD, new imaging)  Goal status: In Progress  ASSESSMENT:  CLINICAL IMPRESSION: Pt with improved tolerance for endurance exercises AEB progressing intervention with wearing ankle weights and completing 148 ft before requiring a rest break. Pt also with no pain today and reports no pain with interventions. The pt will benefit from further skilled PT to improve strength, balance, gait and mobility.  OBJECTIVE IMPAIRMENTS: Abnormal gait, decreased balance, decreased cognition, decreased coordination, decreased endurance, decreased knowledge of use of DME, decreased mobility, difficulty walking, decreased strength, dizziness, hypomobility, impaired UE functional use, improper body mechanics, postural dysfunction, and pain.   ACTIVITY LIMITATIONS: lifting, bending, standing, squatting, stairs, transfers, bed mobility, bathing, dressing, hygiene/grooming, and locomotion level  PARTICIPATION LIMITATIONS: meal prep, cleaning, laundry, medication management, personal  finances, driving, shopping, community activity, and yard work  PERSONAL FACTORS: Age, Fitness, Sex, Time since onset of injury/illness/exacerbation, and 3+ comorbidities: PMH includes hx of breast cancer, HTN, MCI, insomnia, cataracts, bipolar 1, alcohol use disorder, GAD, MDD, osteopenia of neck of R femur, chronic B low back pain with radiating pain to buttock and posterior thighs and calves. Pt with multiple comorbidities, please refer to chart for full details  are also affecting patient's functional outcome.   REHAB POTENTIAL: Fair    CLINICAL DECISION MAKING: Evolving/moderate complexity  EVALUATION COMPLEXITY: High  PLAN:  PT FREQUENCY: 2x/week  PT DURATION: 8 weeks  PLANNED INTERVENTIONS: Therapeutic exercises, Therapeutic activity, Neuromuscular re-education, Balance training, Gait training, Patient/Family education, Self Care, Joint mobilization, Stair training, Vestibular training, Canalith repositioning, Visual/preceptual remediation/compensation, Orthotic/Fit training, DME instructions, Wheelchair mobility training, Spinal mobilization, Cryotherapy, Moist heat, Splintting, Taping, and Manual therapy  PLAN FOR NEXT SESSION: hamtrings loading, hip strength, mobility, endurance, gait and balance   1:55 PM, 02/04/23 Ricard Dillon PT, DPT  Physical Therapist - Gillett Loretto, PT 02/04/2023, 1:55 PM

## 2023-02-09 ENCOUNTER — Ambulatory Visit (INDEPENDENT_AMBULATORY_CARE_PROVIDER_SITE_OTHER): Payer: Medicare PPO | Admitting: Family Medicine

## 2023-02-09 ENCOUNTER — Ambulatory Visit: Payer: Medicare PPO

## 2023-02-09 ENCOUNTER — Encounter: Payer: Medicare PPO | Admitting: Family Medicine

## 2023-02-09 ENCOUNTER — Encounter: Payer: Self-pay | Admitting: Family Medicine

## 2023-02-09 VITALS — BP 127/76 | HR 85 | Temp 97.6°F | Resp 16 | Wt 223.8 lb

## 2023-02-09 DIAGNOSIS — R195 Other fecal abnormalities: Secondary | ICD-10-CM | POA: Diagnosis not present

## 2023-02-09 DIAGNOSIS — R053 Chronic cough: Secondary | ICD-10-CM | POA: Diagnosis not present

## 2023-02-09 DIAGNOSIS — Z0000 Encounter for general adult medical examination without abnormal findings: Secondary | ICD-10-CM

## 2023-02-09 DIAGNOSIS — Z79899 Other long term (current) drug therapy: Secondary | ICD-10-CM

## 2023-02-09 MED ORDER — LOSARTAN POTASSIUM-HCTZ 50-12.5 MG PO TABS: 1.0000 | ORAL_TABLET | Freq: Every day | ORAL | 1 refills | Status: DC

## 2023-02-09 NOTE — Progress Notes (Unsigned)
I,Kendra [name],acting as a Neurosurgeon for Shirlee Latch, MD.,have documented all relevant documentation on the behalf of Shirlee Latch, MD,as directed by  Shirlee Latch, MD while in the presence of Shirlee Latch, MD.    Annual Wellness Visit     Patient: Kendra [name], Female    DOB: 11-08-1945, 77 y.o.   MRN: [id number] Visit Date: 02/09/2023  Today's Provider: Shirlee Latch, MD   Chief Complaint  Patient presents with   Medicare Wellness   Subjective    Kendra [name] is a 77 y.o. female who presents today for her Annual Wellness Visit. She reports consuming a general diet. The patient does not participate in regular exercise at present. She generally feels fairly well. She reports sleeping poorly. She does have additional problems to discuss today.   HPI    Medications: Outpatient Medications Prior to Visit  Medication Sig   calcium-vitamin D (OSCAL WITH D) 500-5 MG-MCG tablet Take 1 tablet by mouth 2 (two) times daily.   Acetaminophen 500 MG capsule Take 1,000 mg by mouth 2 (two) times daily.   AMBULATORY NON FORMULARY MEDICATION 1 Bag by Other route daily. [facility] smart dispenser   carbidopa-levodopa (SINEMET IR) 25-100 MG tablet Take 1 tablet by mouth daily in the afternoon. In the morning   Carbidopa-Levodopa ER (SINEMET CR) 25-100 MG tablet controlled release Take 1 tablet by mouth at bedtime.   cyanocobalamin (,VITAMIN B-12,) 1000 MCG/ML injection INJECT INTO THE MUSCLE EVERY 30 DAYS   denosumab (PROLIA) 60 MG/ML SOSY injection Inject 60 mg into the skin every 6 (six) months. takes annually   FLUoxetine HCl 60 MG TABS Take 1 tablet by mouth daily.   gabapentin (NEURONTIN) 100 MG capsule Take 3 capsules (300 mg total) by mouth 3 (three) times daily. (Patient taking differently: Take 100 mg by mouth 3 (three) times daily.)   letrozole (FEMARA) 2.5 MG tablet Take 1 tablet (2.5 mg total) by mouth daily.   risperiDONE (RISPERDAL) 0.5 MG tablet Take  1 tablet (0.5 mg total) by mouth 2 (two) times daily.   traZODone (DESYREL) 100 MG tablet Take 1 tablet (100 mg total) by mouth at bedtime.   [DISCONTINUED] lisinopril-hydrochlorothiazide (ZESTORETIC) 20-12.5 MG tablet TAKE 1 TABLET BY MOUTH DAILY   No facility-administered medications prior to visit.    Allergies  Allergen Reactions   Hydroxyzine Anaphylaxis    Tongue swollen   Other Other (See Comments)    Food poisoning    Shrimp [Shellfish Allergy] Other (See Comments)    Food poisoning     Patient Care Team: Erasmo Downer, MD as PCP - General (Family Medicine) Carmina Miller, MD as Referring Physician (Radiation Oncology) Lockie Mola, MD as Referring Physician (Ophthalmology) Myrene Galas, PT (Inactive) as Physical Therapist (Physical Therapy) Earna Coder, MD as Consulting Physician (Hematology and Oncology) Delano Metz, MD as Consulting Physician (Pain Medicine)  Review of Systems  Constitutional:  Positive for diaphoresis, fatigue and unexpected weight change.  HENT:  Positive for congestion, ear pain, hearing loss, sinus pressure, sore throat and tinnitus.   Eyes:  Positive for discharge and redness.  Respiratory:  Positive for cough and wheezing.   Cardiovascular:  Positive for leg swelling.  Gastrointestinal:  Positive for abdominal distention, constipation and nausea.  Endocrine: Positive for heat intolerance, polyphagia and polyuria.  Genitourinary:  Positive for enuresis and urgency.  Musculoskeletal:  Positive for arthralgias, back pain, gait problem, joint swelling, myalgias, neck pain and neck stiffness.  Allergic/Immunologic: Positive for environmental allergies  and food allergies.  Neurological:  Positive for dizziness, tremors, speech difficulty, light-headedness and numbness.  Psychiatric/Behavioral:  Positive for confusion, decreased concentration, dysphoric mood and sleep disturbance. The patient is nervous/anxious.      Last CBC Lab Results  Component Value Date   WBC 6.5 08/28/2022   HGB 13.2 08/28/2022   HCT 41.0 08/28/2022   MCV 86.7 08/28/2022   MCH 27.9 08/28/2022   RDW 13.8 08/28/2022   PLT 240 08/28/2022   Last metabolic panel Lab Results  Component Value Date   GLUCOSE 110 (H) 11/06/2022   NA 138 11/06/2022   K 4.0 11/06/2022   CL 98 11/06/2022   CO2 24 11/06/2022   BUN 9 11/06/2022   CREATININE 0.75 11/06/2022   EGFR 83 11/06/2022   CALCIUM 9.6 11/06/2022   PROT 6.3 11/06/2022   ALBUMIN 4.4 11/06/2022   LABGLOB 1.9 11/06/2022   AGRATIO 2.3 (H) 11/06/2022   BILITOT 0.4 11/06/2022   ALKPHOS 37 (L) 11/06/2022   AST 17 11/06/2022   ALT 26 11/06/2022   ANIONGAP 10 08/28/2022   Last lipids Lab Results  Component Value Date   CHOL 191 11/06/2022   HDL 58 11/06/2022   LDLCALC 113 (H) 11/06/2022   TRIG 112 11/06/2022   CHOLHDL 3.3 12/17/2021   Last hemoglobin A1c Lab Results  Component Value Date   HGBA1C 6.0 (H) 11/06/2022   Last thyroid functions Lab Results  Component Value Date   TSH 1.653 01/04/2023   Last vitamin D Lab Results  Component Value Date   VD25OH 15.6 (L) 11/06/2022   Last vitamin B12 and Folate Lab Results  Component Value Date   VITAMINB12 607 12/22/2021   FOLATE 7.2 04/29/2020        Objective    Vitals: BP 127/76 (BP Location: Left Arm, Patient Position: Sitting, Cuff Size: Large)   Pulse 85   Temp 97.6 F (36.4 C) (Temporal)   Resp 16   Wt 223 lb 12.8 oz (101.5 kg)   SpO2 95%   BMI 36.12 kg/m  BP Readings from Last 3 Encounters:  02/09/23 127/76  01/26/23 (!) 137/95  01/12/23 (!) 179/80   Wt Readings from Last 3 Encounters:  02/09/23 223 lb 12.8 oz (101.5 kg)  01/26/23 221 lb (100.2 kg)  01/12/23 226 lb (102.5 kg)       Physical Exam Vitals reviewed.  Constitutional:      General: She is not in acute distress.    Appearance: Normal appearance. She is well-developed. She is not diaphoretic.  HENT:     Head:  Normocephalic and atraumatic.     Right Ear: Tympanic membrane, ear canal and external ear normal.     Left Ear: Tympanic membrane, ear canal and external ear normal.     Nose: Nose normal.     Mouth/Throat:     Mouth: Mucous membranes are moist.     Pharynx: Oropharynx is clear. No oropharyngeal exudate.  Eyes:     General: No scleral icterus.    Conjunctiva/sclera: Conjunctivae normal.     Pupils: Pupils are equal, round, and reactive to light.  Neck:     Thyroid: No thyromegaly.  Cardiovascular:     Rate and Rhythm: Normal rate and regular rhythm.     Pulses: Normal pulses.     Heart sounds: Normal heart sounds. No murmur heard. Pulmonary:     Effort: Pulmonary effort is normal. No respiratory distress.     Breath sounds: Normal breath sounds. No wheezing  or rales.  Abdominal:     General: There is no distension.     Palpations: Abdomen is soft.     Tenderness: There is no abdominal tenderness.  Musculoskeletal:     Cervical back: Neck supple.     Right lower leg: No edema.     Left lower leg: No edema.  Lymphadenopathy:     Cervical: No cervical adenopathy.  Skin:    General: Skin is warm and dry.     Findings: No rash.  Neurological:     Mental Status: She is alert. Mental status is at baseline.  Psychiatric:        Mood and Affect: Mood normal.        Behavior: Behavior normal.        Thought Content: Thought content normal.      Most recent functional status assessment:    02/09/2023    3:37 PM  In your present state of health, do you have any difficulty performing the following activities:  Hearing? 1  Vision? 0  Difficulty concentrating or making decisions? 1  Walking or climbing stairs? 1  Dressing or bathing? 1  Doing errands, shopping? 1   Most recent fall risk assessment:    02/09/2023    3:37 PM  Fall Risk   Falls in the past year? 1  Number falls in past yr: 0  Injury with Fall? 0  Risk for fall due to : History of fall(s);Impaired  balance/gait  Follow up Falls evaluation completed;Education provided;Falls prevention discussed    Most recent depression screenings:    02/09/2023    3:37 PM 01/26/2023    2:13 PM  PHQ 2/9 Scores  PHQ - 2 Score 1 0  PHQ- 9 Score 13    Most recent cognitive screening:    10/21/2021   11:02 AM  6CIT Screen  What Year? 0 points  What month? 0 points  What time? 0 points  Count back from 20 2 points  Months in reverse 4 points  Repeat phrase 4 points  Total Score 10 points   Most recent Audit-C alcohol use screening    02/09/2023    3:38 PM  Alcohol Use Disorder Test (AUDIT)  1. How often do you have a drink containing alcohol? 0  2. How many drinks containing alcohol do you have on a typical day when you are drinking? 0  3. How often do you have six or more drinks on one occasion? 0  AUDIT-C Score 0   A score of 3 or more in women, and 4 or more in men indicates increased risk for alcohol abuse, EXCEPT if all of the points are from question 1   No results found for any visits on 02/09/23.  Assessment & Plan     Annual wellness visit done today including the all of the following: Reviewed patient's Family Medical History Reviewed and updated list of patient's medical providers Assessment of cognitive impairment was done Assessed patient's functional ability Established a written schedule for health screening services Health Risk Assessent Completed and Reviewed  Exercise Activities and Dietary recommendations  Goals      LIFESTYLE - DECREASE FALLS RISK     Recommend to remove any items from the home that may cause slips or trips.        Immunization History  Administered Date(s) Administered   Fluad Quad(high Dose 65+) 07/19/2019, 08/29/2020, 08/12/2021   PNEUMOCOCCAL CONJUGATE-20 10/21/2021   Pneumococcal Polysaccharide-23 08/29/2020    Health  Maintenance  Topic Date Due   DTaP/Tdap/Td (1 - Tdap) Never done   Zoster Vaccines- Shingrix (1 of 2) Never done    COVID-19 Vaccine (3 - Mixed Product risk series) 02/09/2020   INFLUENZA VACCINE  06/03/2023   Fecal DNA (Cologuard)  11/07/2023   Medicare Annual Wellness (AWV)  02/09/2024   DEXA SCAN  07/25/2027   Pneumonia Vaccine 40+ Years old  Completed   Hepatitis C Screening  Completed   HPV VACCINES  Aged Out     Discussed health benefits of physical activity, and encouraged her to engage in regular exercise appropriate for her age and condition.    Problem List Items Addressed This Visit       Other   Polypharmacy    Went over all of her medicaitons and discussed their necessity Answered all questions No changes today other than below for BP/cough      Chronic cough    Ongoing for 9+ months Unclear etiology  Lungs clear on exam Never smoker No improvement with H2 blocker Trial of flonase - no improvement Switch lisinopril to losartan If no improvement with that, consider pulm referral      Other Visit Diagnoses     Encounter for annual wellness visit (AWV) in Medicare patient    -  Primary   Encounter for annual physical exam       Positive colorectal cancer screening using Cologuard test           Patient declines seeing GI for colonoscopy to f/u on positive cologuard at this time - will reassess again at next visit  Return in about 3 months (around 05/11/2023) for chronic disease f/u.     I, Shirlee Latch, MD, have reviewed all documentation for this visit. The documentation on 02/11/23 for the exam, diagnosis, procedures, and orders are all accurate and complete.   Bacigalupo, Marzella Schlein, MD, MPH [facility]

## 2023-02-09 NOTE — Patient Instructions (Signed)
Check at the pharmacy about shingrix and tdap vaccines

## 2023-02-11 ENCOUNTER — Other Ambulatory Visit: Payer: Self-pay

## 2023-02-11 ENCOUNTER — Ambulatory Visit: Payer: Medicare PPO

## 2023-02-11 DIAGNOSIS — R262 Difficulty in walking, not elsewhere classified: Secondary | ICD-10-CM | POA: Diagnosis not present

## 2023-02-11 DIAGNOSIS — M6281 Muscle weakness (generalized): Secondary | ICD-10-CM

## 2023-02-11 DIAGNOSIS — R2681 Unsteadiness on feet: Secondary | ICD-10-CM | POA: Diagnosis not present

## 2023-02-11 DIAGNOSIS — R278 Other lack of coordination: Secondary | ICD-10-CM | POA: Diagnosis not present

## 2023-02-11 DIAGNOSIS — M25552 Pain in left hip: Secondary | ICD-10-CM | POA: Diagnosis not present

## 2023-02-11 DIAGNOSIS — I10 Essential (primary) hypertension: Secondary | ICD-10-CM

## 2023-02-11 DIAGNOSIS — E7849 Other hyperlipidemia: Secondary | ICD-10-CM

## 2023-02-11 DIAGNOSIS — R42 Dizziness and giddiness: Secondary | ICD-10-CM | POA: Diagnosis not present

## 2023-02-11 NOTE — Assessment & Plan Note (Signed)
Ongoing for 9+ months Unclear etiology  Lungs clear on exam Never smoker No improvement with H2 blocker Trial of flonase - no improvement Switch lisinopril to losartan If no improvement with that, consider pulm referral

## 2023-02-11 NOTE — Therapy (Signed)
OUTPATIENT NEURO PHYSICAL THERAPY TREATMENT   Patient Name: Kendra [name] MRN: [id number] DOB:1946-06-18, 77 y.o., female Today's Date: 02/11/2023  PCP: Erasmo Downer, MD REFERRING PROVIDER: Erasmo Downer, MD  END OF SESSION:  PT End of Session - 02/11/23 1632     Visit Number 27    Number of Visits 39    Date for PT Re-Evaluation 03/16/23    Authorization Type [employer] PPO    Authorization Time Period 11/04/2022-12/30/2022    Progress Note Due on Visit 30    PT Start Time 1303    PT Stop Time 1344    PT Time Calculation (min) 41 min    Equipment Utilized During Treatment Gait belt    Activity Tolerance Patient tolerated treatment well;Patient limited by fatigue    Behavior During Therapy WFL for tasks assessed/performed                      Past Medical History:  Diagnosis Date   Anxiety    Breast cancer 03/2018   right breast cancer at 11:00 and 1:00   Bursitis    bilateral hips and knees   Complication of anesthesia    Depression    Family history of breast cancer 03/24/2018   Family history of lung cancer 03/24/2018   History of alcohol dependence 2007   no ETOH; resolved since 2007   History of hiatal hernia    History of psychosis 2014   due to sleep disturbance   Hypertension    Parkinson disease    Personal history of radiation therapy 06/2018-07/2018   right breast ca   PONV (postoperative nausea and vomiting)    Past Surgical History:  Procedure Laterality Date   BREAST BIOPSY Right 03/14/2018   11:00 DCIS and invasive ductal carcinoma   BREAST BIOPSY Right 03/14/2018   1:00 Invasive ductal carcinoma   BREAST LUMPECTOMY Right 04/27/2018   lumpectomy of 11 and 1:00 cancers, clear margins, negative LN   BREAST LUMPECTOMY WITH RADIOACTIVE SEED AND SENTINEL LYMPH NODE BIOPSY Right 04/27/2018   Procedure: RIGHT BREAST LUMPECTOMY WITH RADIOACTIVE SEED X 2 AND RIGHT SENTINEL LYMPH NODE BIOPSY;  Surgeon: Glenna Fellows, MD;  Location: [facility];  Service: General;  Laterality: Right;   CATARACT EXTRACTION W/PHACO Left 08/30/2019   Procedure: CATARACT EXTRACTION PHACO AND INTRAOCULAR LENS PLACEMENT (IOC) LEFT panoptix toric  01:20.5  12.7%  10.26;  Surgeon: Lockie Mola, MD;  Location: [facility];  Service: Ophthalmology;  Laterality: Left;   CATARACT EXTRACTION W/PHACO Right 09/20/2019   Procedure: CATARACT EXTRACTION PHACO AND INTRAOCULAR LENS PLACEMENT (IOC) RIGHT 5.71  00:36.4  15.7%;  Surgeon: Lockie Mola, MD;  Location: [facility];  Service: Ophthalmology;  Laterality: Right;   LAPAROTOMY N/A 02/10/2018   Procedure: EXPLORATORY LAPAROTOMY;  Surgeon: Shonna Chock, MD;  Location: [facility];  Service: Gynecology;  Laterality: N/A;   MASS EXCISION  01/2018   abdominal   OVARIAN CYST REMOVAL     SALPINGOOPHORECTOMY Bilateral 02/10/2018   Procedure: BILATERAL SALPINGO OOPHORECTOMY; PERITONEAL WASHINGS;  Surgeon: Shonna Chock, MD;  Location: [facility];  Service: Gynecology;  Laterality: Bilateral;   TONSILLECTOMY     TONSILLECTOMY AND ADENOIDECTOMY  1953   Patient Active Problem List   Diagnosis Date Noted   Prediabetes 12/15/2022   Chronic cough 08/17/2022   History of allergy to shellfish 02/24/2022   Chronic low back pain of over 3 months duration 02/04/2022   Osteoarthritis  of  AC (acromioclavicular) joint (Left) 02/04/2022   Osteoarthritis of hip (Right) 02/04/2022   Impaction fracture of distal end of radius and ulna, sequela (Left) 02/04/2022   Osteoarthritis of 1st (thumb) carpometacarpal (CMC) joint of hand (Left) 02/04/2022   Lumbosacral facet arthropathy (Multilevel) (Bilateral) (T12-S1) 02/04/2022   Lumbar central spinal stenosis w/o neurogenic claudication (L2-3, L3-4, L4-5) 02/04/2022   Lumbar foraminal stenosis (Bilateral: L2-3, L4-5) (Right: (Severe) L4-5) 02/04/2022   Lumbar lateral recess stenosis (Bilateral: L2-3, L3-4, and  L4-5) (Severe) 02/04/2022   DDD (degenerative disc disease), lumbar 02/04/2022   Lumbosacral facet syndrome (Multilevel) (Bilateral) 02/04/2022   Osteoarthritis of lumbar spine 02/04/2022   Allergic conjunctivitis of both eyes 12/23/2021   Chronic hip pain (3ry area of Pain) (Bilateral) (L>R) 12/22/2021   Chronic lower extremity pain (2ry area of Pain) (Bilateral) (L>R) 12/22/2021   Abnormal MRI, lumbar spine (01/31/2020) 12/22/2021   Grade 1 Anterolisthesis of lumbar spine (L3/L4) & (5 mm) (L4/L5) 12/22/2021   Chronic pain syndrome 11/17/2021   High risk medication use 11/17/2021   Disorder of skeletal system 11/17/2021   Problems influencing health status 11/17/2021   Other hyperlipidemia 10/21/2021   Polypharmacy 08/12/2021   Chronic low back pain (1ry area of Pain) (Bilateral) (L>R) w/o sciatica 06/18/2021   Osteopenia of neck of femur (Right) 09/06/2020   Obesity 08/29/2020   Parkinson disease 02/22/2020   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   Bipolar disorder, in full remission, most recent episode depressed 11/08/2019   Vitamin D insufficiency 08/24/2019   Insomnia due to mental condition 05/31/2019   Bipolar I disorder, most recent episode depressed 05/31/2019   Alcohol use disorder, moderate, in sustained remission 05/31/2019   Elevated tumor markers 05/27/2019   GAD (generalized anxiety disorder) 04/10/2019   MDD (major depressive disorder), severe 01/31/2019   High serum carbohydrate antigen 19-9 (CA19-9) 12/01/2018   Adjustment disorder with anxious mood 11/24/2018   Essential hypertension 11/24/2018   B12 deficiency 10/17/2018   Incisional hernia, without obstruction or gangrene 09/12/2018   Genetic testing 04/07/2018   Family history of breast cancer 03/24/2018   Family history of lung cancer 03/24/2018   Malignant neoplasm of upper-outer quadrant of right breast in female, estrogen receptor positive 03/17/2018   Malignant neoplasm of upper-inner quadrant of right  breast in female, estrogen receptor positive 03/17/2018   History of psychosis 02/07/2018   History of insomnia 02/07/2018    ONSET DATE: Pt diagnosed with PD in 2019  REFERRING DIAG:  G20 (ICD-10-CM) - Parkinson's disease      THERAPY DIAG:  Other lack of coordination  Difficulty in walking, not elsewhere classified  Unsteadiness on feet  Muscle weakness (generalized)  Rationale for Evaluation and Treatment: Rehabilitation  SUBJECTIVE: Pt reports no pain at the moment. She says she feels she is moving better on "one level"                                                                            Pain: no pain currently Pt accompanied by: significant other self  PERTINENT HISTORY:  Pt is a pleasant 77 yo female who was diagnosed with PD in 2019. She is known to clinic and [facility], and was last seen for LSVT  BIG Sept-Oct 2022. Pt with chronic pain and has become increasingly sedentary as a result exacerbating mobility difficulties due to PD. Current concerns: Pt reports L and R hip pain and back pain. Pt was being seen by pain management clinic. Pt has had injections that provided relief at the time. Pt's spouse reports her physician determined surgery was not an option for her pain so the plan is to manage it. Pt spouse thinks pain has resulted in reluctance for pt to move. Pt currently sedentary, doesn't walk much, but when she does she holds on to furniture for support due to balance problems. When pt first stands she must hold on to something due to mix of pain and/or dizziness (described as lightheadedness). Her pain can go from 3/10 to 9/10. It does improve once she starts moving. Pt has increased difficulty with multiple activities including: getting in and out of her bed, getting in and out of her car, freezing, decreased gait speed, decreased balance and cognition, and difficulty using BUE due to tremors.  Per chart other PMH includes hx of breast cancer, HTN, MCI, insomnia,  cataracts, bipolar 1, alcohol use disorder, GAD, MDD, osteopenia of neck of R femur, chronic B low back pain with radiating pain to buttock and posterior thighs and calves. Pt with multiple comorbidities, please refer to chart for full details    PAIN: from eval Are you having pain? Yes: NPRS scale: not rated currently but can reach 9/10 Pain location: L hip and L leg Pain description: pt has difficulty describing Aggravating factors: unsure Relieving factors: laying in the recliner helps, movement  PRECAUTIONS: Fall  WEIGHT BEARING RESTRICTIONS: No  FALLS: Has patient fallen in last 6 months? Yes. Number of falls 1x, leg tangled in chair leg when getting up from a chair  PLOF: Independent with basic ADLs  PATIENT GOALS: improve mobility   BLE Strength Assessment      Right  Left  Hip Flexion 5/5 4/5 (painful)  Hip Horizontal ABDCT  5/5 5/5  Hip Horizontal ADD 4+/5 4+/5  Hip IR (seated)  4+/5 4+/5  Hip ER (seated) 5/5 5/5  Knee Extension 5/5 5/5  Knee Flexion (seated) 4+/5  5/5 (not painful)  Hip ABDCT (supine)  3+/5 4-/5   Focal popliteal edema Left knee, not painful   ROM Assessment Hip     Right  Left   Flexion Supine 132 120 (ouch)  External rotation (90/90) 30 47  Internal Rotation (90/90) 40  20  Supine Hip Extension P/ROM >0 degrees >0 degrees       IMAGING: via chart   07/24/22 DG BONE density " ASSESSMENT: The probability of a major osteoporotic fracture is 15.8% within the next ten years.   The probability of a hip fracture is 2.9% within the next ten years."   12/22/21 DG lumbar spine " IMPRESSION: 1. No acute displaced fracture or traumatic listhesis of the lumbar spine. 2. Interval development of grade 1 anterolisthesis of L3 on L4. Stable grade 1 anterolisthesis of L4 on L5. Appears stable on flexion extension views. 3.  Aortic Atherosclerosis (ICD10-I70.0)."   New from chart:  DG Lumbar spine 01/05/23: "IMPRESSION: 1. Lumbar spine scoliosis  concave right. Diffuse multilevel degenerative change. Disc degeneration most prominent at L2-L3 and L5-S1. 5 mm anterolisthesis L4 on L5. Diffuse severe facet hypertrophy. No flexion or extension abnormality identified. No evidence of fracture. 2. Sclerotic densities are noted in the L2 and L4 vertebral bodies. Although these could represent bone islands blastic  metastatic disease cannot be excluded. MRI of the lumbar spine suggested for further evaluation. 3. Large amount of stool noted throughout the colon. 4. Aortoiliac atherosclerotic vascular disease."    DG hip 01/05/23: "IMPRESSION: Moderate bilateral femoroacetabular osteoarthritis, similar to prior."   PATIENT EDUCATION:  Education details: Pt educated throughout session about proper posture and technique with exercises. Improved exercise technique, movement at target joints, use of target muscles after min to mod verbal, visual, tactile cues. LSVT BIG interventions Person educated: Patient and Spouse Education method: Explanation, Demonstration, and Verbal cues Education comprehension: verbalized understanding, returned demonstration, verbal cues required, tactile cues required, and needs further education   TODAY'S TREATMENT:                                                                                                                                         DATE: 02/11/23   TE:  Circuit- 4# AW donned each LE 3 rounds of the following to promote LE mm endurance and cardioresp endurance:  Amb with 4WW 1x48 ft without stopping followed by  10 STS (large amplitude)  Large-amplitude bilat shoulder abduction with trunk twist to clap hands each direction 1x10    Brief rest breaks between sets due to fatigue  Other interventions- Alternating step-ups onto 6" step x multiple reps FWD/BCKWD, and then repeated 5 reps LTL (each direction)  Pt completes additional lap of ambulation with 4WW of approximately 160 ft. Takes  seated rest break following lap.   NMR:  Obstacle course: negotiating multiple cones with 4WW 4x through.      HOME EXERCISE PROGRAM:   Access Code: [email or web address] URL: [email or web address] Date: 12/22/2022 Prepared by: Temple Pacini  Exercises - Sit to Stand with Counter Support  - 1 x daily - 5 x weekly - 3 sets - 10 reps  GOALS: Goals reviewed with patient? Yes  SHORT TERM GOALS: Target date: 10/27/2022    Patient will be independent in home exercise program to improve strength/mobility for better functional independence with ADLs. Baseline: LSVT BIG protocol to be initiated next session; 12/18: not yet fully indep, reports she still gets confused about the technique; 12/03/2022: HEP to be updated; Pt reports practicing HEP 2x/week Goal status: IN PROGRESS   LONG TERM GOALS: Target date: 11/11/2022   Patient will increase FOTO score to equal to or greater than 49 to demonstrate statistically significant improvement in mobility and quality of life.  Baseline: 40; 12/18: deferred due to time; 45; 3/19: deferred to next visit; 01/21/2023 Goal status: PARTIALLY MET  2.  Patient (> 2 years old) will complete five times sit to stand test in < 15 seconds indicating decreased fall risk.  Baseline: 27 sec; 12/18 23 sec hands-free; 11/04/22: 24 sec hands free; 2/1/124: 22.7 sec use of BUE, 21.3 sec hands-free; 01/19/23: 18 seconds hands-free Goal status: IN PROGRESS  3.  Patient will increase 10 meter walk test to >  1.29m/s with WFL B step-length, upright posture for >90% of test to improve ease with gait and to override hypokentic and bradykinetic movement.  Baseline: 0.58 m/s with 1OX; 12/18: 0.7 m/s with 0RU; 11/04/22: 0.69 m/s with 0AV;  12/03/22: 0.73 m/s with 4UJ; 01/19/2023: 0.77 m/s with 4WW Goal status: IN PROGRESS  4.  Patient will reduce time to don jacket by at least 5 seconds in order to increase ease with dressing and override bradykinetic movement Baseline: 17  seconds; 12/18: 12 seconds Goal status: MET  5.  Pt will demo or report ability to open standard jar using only 1-2 attempts in order to exhibit improved functional mobility for ADLs.  Baseline: 5.7 seconds with multiple attempts to turn jar lid to open; 12/18: 2.8 sec opens in 2 turns Goal status: MET  6.  Patient will increase Berg Balance score by > 6 points to demonstrate decreased fall risk during functional activities. Baseline: to be tested next 1-2 visits; 12/03/2022: 44/56; 01/21/23 45/56 Goal status: In Progress  7.  Patient will report at least 4 days in a row with minimal to no L hip pain in order to improve QOL and ease/safety with functional mobility. Baseline: Pt reports pain majority of days in L hip; 12/03/2022: pt reports having hip pain about every day; 3/19: rates hip pain daily (being seen now by MD, new imaging)  Goal status: In Progress  ASSESSMENT:  CLINICAL IMPRESSION: Pt continues to advance endurance exercises with increased weights and with ambulating for longer distances before taking a rest break. While pt shows progress, she still requires multiple short rest breaks throughout appointment due to fatigue. The pt will benefit from further skilled PT to improve strength, balance, gait and mobility.  OBJECTIVE IMPAIRMENTS: Abnormal gait, decreased balance, decreased cognition, decreased coordination, decreased endurance, decreased knowledge of use of DME, decreased mobility, difficulty walking, decreased strength, dizziness, hypomobility, impaired UE functional use, improper body mechanics, postural dysfunction, and pain.   ACTIVITY LIMITATIONS: lifting, bending, standing, squatting, stairs, transfers, bed mobility, bathing, dressing, hygiene/grooming, and locomotion level  PARTICIPATION LIMITATIONS: meal prep, cleaning, laundry, medication management, personal finances, driving, shopping, community activity, and yard work  PERSONAL FACTORS: Age, Fitness, Sex, Time  since onset of injury/illness/exacerbation, and 3+ comorbidities: PMH includes hx of breast cancer, HTN, MCI, insomnia, cataracts, bipolar 1, alcohol use disorder, GAD, MDD, osteopenia of neck of R femur, chronic B low back pain with radiating pain to buttock and posterior thighs and calves. Pt with multiple comorbidities, please refer to chart for full details  are also affecting patient's functional outcome.   REHAB POTENTIAL: Fair    CLINICAL DECISION MAKING: Evolving/moderate complexity  EVALUATION COMPLEXITY: High  PLAN:  PT FREQUENCY: 2x/week  PT DURATION: 8 weeks  PLANNED INTERVENTIONS: Therapeutic exercises, Therapeutic activity, Neuromuscular re-education, Balance training, Gait training, Patient/Family education, Self Care, Joint mobilization, Stair training, Vestibular training, Canalith repositioning, Visual/preceptual remediation/compensation, Orthotic/Fit training, DME instructions, Wheelchair mobility training, Spinal mobilization, Cryotherapy, Moist heat, Splintting, Taping, and Manual therapy  PLAN FOR NEXT SESSION: hamtrings loading, hip strength, mobility, endurance, gait and balance   4:37 PM, 02/11/23 Temple Pacini PT, DPT  Physical Therapist - [facility] [phone]     Baird Kay, PT 02/11/2023, 4:37 PM

## 2023-02-11 NOTE — Assessment & Plan Note (Addendum)
Went over all of her medicaitons and discussed their necessity Answered all questions No changes today other than below for BP/cough

## 2023-02-16 ENCOUNTER — Ambulatory Visit: Payer: Medicare PPO

## 2023-02-16 DIAGNOSIS — R262 Difficulty in walking, not elsewhere classified: Secondary | ICD-10-CM

## 2023-02-16 DIAGNOSIS — R2681 Unsteadiness on feet: Secondary | ICD-10-CM | POA: Diagnosis not present

## 2023-02-16 DIAGNOSIS — R42 Dizziness and giddiness: Secondary | ICD-10-CM | POA: Diagnosis not present

## 2023-02-16 DIAGNOSIS — M6281 Muscle weakness (generalized): Secondary | ICD-10-CM | POA: Diagnosis not present

## 2023-02-16 DIAGNOSIS — M25552 Pain in left hip: Secondary | ICD-10-CM | POA: Diagnosis not present

## 2023-02-16 DIAGNOSIS — R278 Other lack of coordination: Secondary | ICD-10-CM | POA: Diagnosis not present

## 2023-02-16 NOTE — Therapy (Signed)
OUTPATIENT NEURO PHYSICAL THERAPY TREATMENT   Patient Name: Kendra [name] MRN: [id number] DOB:April 07, 1946, 77 y.o., female Today's Date: 02/16/2023  PCP: Erasmo Downer, MD REFERRING PROVIDER: Erasmo Downer, MD  END OF SESSION:  PT End of Session - 02/16/23 1301     Visit Number 28    Number of Visits 39    Date for PT Re-Evaluation 03/16/23    Authorization Type [employer] PPO    Authorization Time Period 11/04/2022-12/30/2022    Progress Note Due on Visit 30    PT Start Time 1302    PT Stop Time 1345    PT Time Calculation (min) 43 min    Equipment Utilized During Treatment Gait belt    Activity Tolerance Patient tolerated treatment well;Patient limited by fatigue    Behavior During Therapy WFL for tasks assessed/performed                      Past Medical History:  Diagnosis Date   Anxiety    Breast cancer 03/2018   right breast cancer at 11:00 and 1:00   Bursitis    bilateral hips and knees   Complication of anesthesia    Depression    Family history of breast cancer 03/24/2018   Family history of lung cancer 03/24/2018   History of alcohol dependence 2007   no ETOH; resolved since 2007   History of hiatal hernia    History of psychosis 2014   due to sleep disturbance   Hypertension    Parkinson disease    Personal history of radiation therapy 06/2018-07/2018   right breast ca   PONV (postoperative nausea and vomiting)    Past Surgical History:  Procedure Laterality Date   BREAST BIOPSY Right 03/14/2018   11:00 DCIS and invasive ductal carcinoma   BREAST BIOPSY Right 03/14/2018   1:00 Invasive ductal carcinoma   BREAST LUMPECTOMY Right 04/27/2018   lumpectomy of 11 and 1:00 cancers, clear margins, negative LN   BREAST LUMPECTOMY WITH RADIOACTIVE SEED AND SENTINEL LYMPH NODE BIOPSY Right 04/27/2018   Procedure: RIGHT BREAST LUMPECTOMY WITH RADIOACTIVE SEED X 2 AND RIGHT SENTINEL LYMPH NODE BIOPSY;  Surgeon: Glenna Fellows, MD;  Location: [facility];  Service: General;  Laterality: Right;   CATARACT EXTRACTION W/PHACO Left 08/30/2019   Procedure: CATARACT EXTRACTION PHACO AND INTRAOCULAR LENS PLACEMENT (IOC) LEFT panoptix toric  01:20.5  12.7%  10.26;  Surgeon: Lockie Mola, MD;  Location: [facility];  Service: Ophthalmology;  Laterality: Left;   CATARACT EXTRACTION W/PHACO Right 09/20/2019   Procedure: CATARACT EXTRACTION PHACO AND INTRAOCULAR LENS PLACEMENT (IOC) RIGHT 5.71  00:36.4  15.7%;  Surgeon: Lockie Mola, MD;  Location: [facility];  Service: Ophthalmology;  Laterality: Right;   LAPAROTOMY N/A 02/10/2018   Procedure: EXPLORATORY LAPAROTOMY;  Surgeon: Shonna Chock, MD;  Location: [facility];  Service: Gynecology;  Laterality: N/A;   MASS EXCISION  01/2018   abdominal   OVARIAN CYST REMOVAL     SALPINGOOPHORECTOMY Bilateral 02/10/2018   Procedure: BILATERAL SALPINGO OOPHORECTOMY; PERITONEAL WASHINGS;  Surgeon: Shonna Chock, MD;  Location: [facility];  Service: Gynecology;  Laterality: Bilateral;   TONSILLECTOMY     TONSILLECTOMY AND ADENOIDECTOMY  1953   Patient Active Problem List   Diagnosis Date Noted   Prediabetes 12/15/2022   Chronic cough 08/17/2022   History of allergy to shellfish 02/24/2022   Chronic low back pain of over 3 months duration 02/04/2022   Osteoarthritis  of  AC (acromioclavicular) joint (Left) 02/04/2022   Osteoarthritis of hip (Right) 02/04/2022   Impaction fracture of distal end of radius and ulna, sequela (Left) 02/04/2022   Osteoarthritis of 1st (thumb) carpometacarpal (CMC) joint of hand (Left) 02/04/2022   Lumbosacral facet arthropathy (Multilevel) (Bilateral) (T12-S1) 02/04/2022   Lumbar central spinal stenosis w/o neurogenic claudication (L2-3, L3-4, L4-5) 02/04/2022   Lumbar foraminal stenosis (Bilateral: L2-3, L4-5) (Right: (Severe) L4-5) 02/04/2022   Lumbar lateral recess stenosis (Bilateral: L2-3, L3-4, and  L4-5) (Severe) 02/04/2022   DDD (degenerative disc disease), lumbar 02/04/2022   Lumbosacral facet syndrome (Multilevel) (Bilateral) 02/04/2022   Osteoarthritis of lumbar spine 02/04/2022   Allergic conjunctivitis of both eyes 12/23/2021   Chronic hip pain (3ry area of Pain) (Bilateral) (L>R) 12/22/2021   Chronic lower extremity pain (2ry area of Pain) (Bilateral) (L>R) 12/22/2021   Abnormal MRI, lumbar spine (01/31/2020) 12/22/2021   Grade 1 Anterolisthesis of lumbar spine (L3/L4) & (5 mm) (L4/L5) 12/22/2021   Chronic pain syndrome 11/17/2021   High risk medication use 11/17/2021   Disorder of skeletal system 11/17/2021   Problems influencing health status 11/17/2021   Other hyperlipidemia 10/21/2021   Polypharmacy 08/12/2021   Chronic low back pain (1ry area of Pain) (Bilateral) (L>R) w/o sciatica 06/18/2021   Osteopenia of neck of femur (Right) 09/06/2020   Obesity 08/29/2020   Parkinson disease 02/22/2020   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   Bipolar disorder, in full remission, most recent episode depressed 11/08/2019   Vitamin D insufficiency 08/24/2019   Insomnia due to mental condition 05/31/2019   Bipolar I disorder, most recent episode depressed 05/31/2019   Alcohol use disorder, moderate, in sustained remission 05/31/2019   Elevated tumor markers 05/27/2019   GAD (generalized anxiety disorder) 04/10/2019   MDD (major depressive disorder), severe 01/31/2019   High serum carbohydrate antigen 19-9 (CA19-9) 12/01/2018   Adjustment disorder with anxious mood 11/24/2018   Essential hypertension 11/24/2018   B12 deficiency 10/17/2018   Incisional hernia, without obstruction or gangrene 09/12/2018   Genetic testing 04/07/2018   Family history of breast cancer 03/24/2018   Family history of lung cancer 03/24/2018   Malignant neoplasm of upper-outer quadrant of right breast in female, estrogen receptor positive 03/17/2018   Malignant neoplasm of upper-inner quadrant of right  breast in female, estrogen receptor positive 03/17/2018   History of psychosis 02/07/2018   History of insomnia 02/07/2018    ONSET DATE: Pt diagnosed with PD in 2019  REFERRING DIAG:  G20 (ICD-10-CM) - Parkinson's disease      THERAPY DIAG:  Difficulty in walking, not elsewhere classified  Pain in left hip  Muscle weakness (generalized)  Unsteadiness on feet  Rationale for Evaluation and Treatment: Rehabilitation  SUBJECTIVE: Pt was in shower this morning and stepped to the side and almost slipped, but caught herself and didn't fall. Her back has been feeling a little sore since.                                                                             Pain: no pain currently Pt accompanied by: significant other self  PERTINENT HISTORY:  Pt is a pleasant 77 yo female who was diagnosed with PD in 2019. She  is known to clinic and [facility], and was last seen for LSVT BIG Sept-Oct 2022. Pt with chronic pain and has become increasingly sedentary as a result exacerbating mobility difficulties due to PD. Current concerns: Pt reports L and R hip pain and back pain. Pt was being seen by pain management clinic. Pt has had injections that provided relief at the time. Pt's spouse reports her physician determined surgery was not an option for her pain so the plan is to manage it. Pt spouse thinks pain has resulted in reluctance for pt to move. Pt currently sedentary, doesn't walk much, but when she does she holds on to furniture for support due to balance problems. When pt first stands she must hold on to something due to mix of pain and/or dizziness (described as lightheadedness). Her pain can go from 3/10 to 9/10. It does improve once she starts moving. Pt has increased difficulty with multiple activities including: getting in and out of her bed, getting in and out of her car, freezing, decreased gait speed, decreased balance and cognition, and difficulty using BUE due to tremors.  Per chart  other PMH includes hx of breast cancer, HTN, MCI, insomnia, cataracts, bipolar 1, alcohol use disorder, GAD, MDD, osteopenia of neck of R femur, chronic B low back pain with radiating pain to buttock and posterior thighs and calves. Pt with multiple comorbidities, please refer to chart for full details    PAIN: from eval Are you having pain? Yes: NPRS scale: not rated currently but can reach 9/10 Pain location: L hip and L leg Pain description: pt has difficulty describing Aggravating factors: unsure Relieving factors: laying in the recliner helps, movement  PRECAUTIONS: Fall  WEIGHT BEARING RESTRICTIONS: No  FALLS: Has patient fallen in last 6 months? Yes. Number of falls 1x, leg tangled in chair leg when getting up from a chair  PLOF: Independent with basic ADLs  PATIENT GOALS: improve mobility   BLE Strength Assessment      Right  Left  Hip Flexion 5/5 4/5 (painful)  Hip Horizontal ABDCT  5/5 5/5  Hip Horizontal ADD 4+/5 4+/5  Hip IR (seated)  4+/5 4+/5  Hip ER (seated) 5/5 5/5  Knee Extension 5/5 5/5  Knee Flexion (seated) 4+/5  5/5 (not painful)  Hip ABDCT (supine)  3+/5 4-/5   Focal popliteal edema Left knee, not painful   ROM Assessment Hip     Right  Left   Flexion Supine 132 120 (ouch)  External rotation (90/90) 30 47  Internal Rotation (90/90) 40  20  Supine Hip Extension P/ROM >0 degrees >0 degrees       IMAGING: via chart   07/24/22 DG BONE density " ASSESSMENT: The probability of a major osteoporotic fracture is 15.8% within the next ten years.   The probability of a hip fracture is 2.9% within the next ten years."   12/22/21 DG lumbar spine " IMPRESSION: 1. No acute displaced fracture or traumatic listhesis of the lumbar spine. 2. Interval development of grade 1 anterolisthesis of L3 on L4. Stable grade 1 anterolisthesis of L4 on L5. Appears stable on flexion extension views. 3.  Aortic Atherosclerosis (ICD10-I70.0)."   New from chart:  DG  Lumbar spine 01/05/23: "IMPRESSION: 1. Lumbar spine scoliosis concave right. Diffuse multilevel degenerative change. Disc degeneration most prominent at L2-L3 and L5-S1. 5 mm anterolisthesis L4 on L5. Diffuse severe facet hypertrophy. No flexion or extension abnormality identified. No evidence of fracture. 2. Sclerotic densities are noted in the  L2 and L4 vertebral bodies. Although these could represent bone islands blastic metastatic disease cannot be excluded. MRI of the lumbar spine suggested for further evaluation. 3. Large amount of stool noted throughout the colon. 4. Aortoiliac atherosclerotic vascular disease."    DG hip 01/05/23: "IMPRESSION: Moderate bilateral femoroacetabular osteoarthritis, similar to prior."   PATIENT EDUCATION:  Education details: Pt educated throughout session about proper posture and technique with exercises. Improved exercise technique, movement at target joints, use of target muscles after min to mod verbal, visual, tactile cues. LSVT BIG interventions Person educated: Patient and Spouse Education method: Explanation, Demonstration, and Verbal cues Education comprehension: verbalized understanding, returned demonstration, verbal cues required, tactile cues required, and needs further education   TODAY'S TREATMENT:                                                                                                                                         DATE: 02/16/23  Gait belt donned and CGA provided unless specified otherwise NMR:  PT provides education to pt and spouse regarding benefits of installing a grab bar and having a non-slip shower mat to reduce fall risk. Pt's spouse verbalizes understanding, plans to install grab bars.  LTL stepping at support surface (each direction) 1x 60 sec, 1x90 sec. Requires rest break between sets due to fatigue.   LTL stepping over half-bolster x multiple reps. Attempts without UE support, but unable to complete  without support   TE Circuit- 4# AW donned each LE 3 rounds of the following to promote LE mm endurance and cardioresp endurance:  Amb with 4WW 296 ft (brief seated rest break after 200-222 ft) LAQ 10x each LE  Seated side step 10x each LE - initially attempted seated march, however, discontinued since this increased L hip pain 10 STS (large amplitude)   Brief rest breaks between sets due to fatigue      HOME EXERCISE PROGRAM:   Access Code: [email or web address] URL: [email or web address] Date: 12/22/2022 Prepared by: Temple Pacini  Exercises - Sit to Stand with Counter Support  - 1 x daily - 5 x weekly - 3 sets - 10 reps  GOALS: Goals reviewed with patient? Yes  SHORT TERM GOALS: Target date: 10/27/2022    Patient will be independent in home exercise program to improve strength/mobility for better functional independence with ADLs. Baseline: LSVT BIG protocol to be initiated next session; 12/18: not yet fully indep, reports she still gets confused about the technique; 12/03/2022: HEP to be updated; Pt reports practicing HEP 2x/week Goal status: IN PROGRESS   LONG TERM GOALS: Target date: 11/11/2022   Patient will increase FOTO score to equal to or greater than 49 to demonstrate statistically significant improvement in mobility and quality of life.  Baseline: 40; 12/18: deferred due to time; 45; 3/19: deferred to next visit; 01/21/2023 Goal status: PARTIALLY MET  2.  Patient (> 40 years old) will complete  five times sit to stand test in < 15 seconds indicating decreased fall risk.  Baseline: 27 sec; 12/18 23 sec hands-free; 11/04/22: 24 sec hands free; 2/1/124: 22.7 sec use of BUE, 21.3 sec hands-free; 01/19/23: 18 seconds hands-free Goal status: IN PROGRESS  3.  Patient will increase 10 meter walk test to >1.47m/s with WFL B step-length, upright posture for >90% of test to improve ease with gait and to override hypokentic and bradykinetic movement.  Baseline: 0.58 m/s with  6NG; 12/18: 0.7 m/s with 2XB; 11/04/22: 0.69 m/s with 2WU;  12/03/22: 0.73 m/s with 1LK; 01/19/2023: 0.77 m/s with 4WW Goal status: IN PROGRESS  4.  Patient will reduce time to don jacket by at least 5 seconds in order to increase ease with dressing and override bradykinetic movement Baseline: 17 seconds; 12/18: 12 seconds Goal status: MET  5.  Pt will demo or report ability to open standard jar using only 1-2 attempts in order to exhibit improved functional mobility for ADLs.  Baseline: 5.7 seconds with multiple attempts to turn jar lid to open; 12/18: 2.8 sec opens in 2 turns Goal status: MET  6.  Patient will increase Berg Balance score by > 6 points to demonstrate decreased fall risk during functional activities. Baseline: to be tested next 1-2 visits; 12/03/2022: 44/56; 01/21/23 45/56 Goal status: In Progress  7.  Patient will report at least 4 days in a row with minimal to no L hip pain in order to improve QOL and ease/safety with functional mobility. Baseline: Pt reports pain majority of days in L hip; 12/03/2022: pt reports having hip pain about every day; 3/19: rates hip pain daily (being seen now by MD, new imaging)  Goal status: In Progress  ASSESSMENT:  CLINICAL IMPRESSION: Pt presents to session reporting near-fall in shower. PT reviewed safety features that can assist with decreasing fall risk in this situation: grab bars, anti-slip mat. Pt spouse and pt verbalized understanding. Pt continues to progress her endurance with circuit training. Also performed side stepping interventions to target balance deficit (pt almost slipped when side-stepping in shower). The pt will benefit from further skilled PT to improve strength, balance, gait and mobility.  OBJECTIVE IMPAIRMENTS: Abnormal gait, decreased balance, decreased cognition, decreased coordination, decreased endurance, decreased knowledge of use of DME, decreased mobility, difficulty walking, decreased strength, dizziness, hypomobility,  impaired UE functional use, improper body mechanics, postural dysfunction, and pain.   ACTIVITY LIMITATIONS: lifting, bending, standing, squatting, stairs, transfers, bed mobility, bathing, dressing, hygiene/grooming, and locomotion level  PARTICIPATION LIMITATIONS: meal prep, cleaning, laundry, medication management, personal finances, driving, shopping, community activity, and yard work  PERSONAL FACTORS: Age, Fitness, Sex, Time since onset of injury/illness/exacerbation, and 3+ comorbidities: PMH includes hx of breast cancer, HTN, MCI, insomnia, cataracts, bipolar 1, alcohol use disorder, GAD, MDD, osteopenia of neck of R femur, chronic B low back pain with radiating pain to buttock and posterior thighs and calves. Pt with multiple comorbidities, please refer to chart for full details  are also affecting patient's functional outcome.   REHAB POTENTIAL: Fair    CLINICAL DECISION MAKING: Evolving/moderate complexity  EVALUATION COMPLEXITY: High  PLAN:  PT FREQUENCY: 2x/week  PT DURATION: 8 weeks  PLANNED INTERVENTIONS: Therapeutic exercises, Therapeutic activity, Neuromuscular re-education, Balance training, Gait training, Patient/Family education, Self Care, Joint mobilization, Stair training, Vestibular training, Canalith repositioning, Visual/preceptual remediation/compensation, Orthotic/Fit training, DME instructions, Wheelchair mobility training, Spinal mobilization, Cryotherapy, Moist heat, Splintting, Taping, and Manual therapy  PLAN FOR NEXT SESSION: hamtrings loading, hip strength, mobility,  endurance, gait and balance   1:59 PM, 02/16/23 Temple Pacini PT, DPT  Physical Therapist - [facility]  [facility] [phone]     Baird Kay, [place] 02/16/2023, 1:59 PM

## 2023-02-18 ENCOUNTER — Ambulatory Visit: Payer: Medicare PPO

## 2023-02-18 DIAGNOSIS — R278 Other lack of coordination: Secondary | ICD-10-CM | POA: Diagnosis not present

## 2023-02-18 DIAGNOSIS — M6281 Muscle weakness (generalized): Secondary | ICD-10-CM

## 2023-02-18 DIAGNOSIS — R262 Difficulty in walking, not elsewhere classified: Secondary | ICD-10-CM | POA: Diagnosis not present

## 2023-02-18 DIAGNOSIS — R42 Dizziness and giddiness: Secondary | ICD-10-CM | POA: Diagnosis not present

## 2023-02-18 DIAGNOSIS — M25552 Pain in left hip: Secondary | ICD-10-CM

## 2023-02-18 DIAGNOSIS — R2681 Unsteadiness on feet: Secondary | ICD-10-CM | POA: Diagnosis not present

## 2023-02-18 NOTE — Therapy (Signed)
OUTPATIENT NEURO PHYSICAL THERAPY TREATMENT   Patient Name: Kendra [name] MRN: [id number] DOB:[birth year], 77 y.o., female Today's Date: 02/18/2023  PCP: Erasmo Downer, MD REFERRING PROVIDER: Erasmo Downer, MD  END OF SESSION:  PT End of Session - 02/18/23 1311     Visit Number 29    Number of Visits 39    Date for PT Re-Evaluation 03/16/23    Authorization Type [employer] PPO    Authorization Time Period 11/04/2022-12/30/2022    Progress Note Due on Visit 30    PT Start Time 1310    PT Stop Time 1345    PT Time Calculation (min) 35 min    Equipment Utilized During Treatment Gait belt    Activity Tolerance Patient tolerated treatment well;Patient limited by fatigue    Behavior During Therapy WFL for tasks assessed/performed                      Past Medical History:  Diagnosis Date   Anxiety    Breast cancer 03/2018   right breast cancer at 11:00 and 1:00   Bursitis    bilateral hips and knees   Complication of anesthesia    Depression    Family history of breast cancer 03/24/2018   Family history of lung cancer 03/24/2018   History of alcohol dependence 2007   no ETOH; resolved since 2007   History of hiatal hernia    History of psychosis 2014   due to sleep disturbance   Hypertension    Parkinson disease    Personal history of radiation therapy 06/2018-07/2018   right breast ca   PONV (postoperative nausea and vomiting)    Past Surgical History:  Procedure Laterality Date   BREAST BIOPSY Right 03/14/2018   11:00 DCIS and invasive ductal carcinoma   BREAST BIOPSY Right 03/14/2018   1:00 Invasive ductal carcinoma   BREAST LUMPECTOMY Right 04/27/2018   lumpectomy of 11 and 1:00 cancers, clear margins, negative LN   BREAST LUMPECTOMY WITH RADIOACTIVE SEED AND SENTINEL LYMPH NODE BIOPSY Right 04/27/2018   Procedure: RIGHT BREAST LUMPECTOMY WITH RADIOACTIVE SEED X 2 AND RIGHT SENTINEL LYMPH NODE BIOPSY;  Surgeon: Glenna Fellows, MD;  Location: [facility];  Service: General;  Laterality: Right;   CATARACT EXTRACTION W/PHACO Left 08/30/2019   Procedure: CATARACT EXTRACTION PHACO AND INTRAOCULAR LENS PLACEMENT (IOC) LEFT panoptix toric  01:20.5  12.7%  10.26;  Surgeon: Lockie Mola, MD;  Location: [facility];  Service: Ophthalmology;  Laterality: Left;   CATARACT EXTRACTION W/PHACO Right 09/20/2019   Procedure: CATARACT EXTRACTION PHACO AND INTRAOCULAR LENS PLACEMENT (IOC) RIGHT 5.71  00:36.4  15.7%;  Surgeon: Lockie Mola, MD;  Location: [facility];  Service: Ophthalmology;  Laterality: Right;   LAPAROTOMY N/A 02/10/2018   Procedure: EXPLORATORY LAPAROTOMY;  Surgeon: Shonna Chock, MD;  Location: [facility];  Service: Gynecology;  Laterality: N/A;   MASS EXCISION  01/2018   abdominal   OVARIAN CYST REMOVAL     SALPINGOOPHORECTOMY Bilateral 02/10/2018   Procedure: BILATERAL SALPINGO OOPHORECTOMY; PERITONEAL WASHINGS;  Surgeon: Shonna Chock, MD;  Location: [facility];  Service: Gynecology;  Laterality: Bilateral;   TONSILLECTOMY     TONSILLECTOMY AND ADENOIDECTOMY  1953   Patient Active Problem List   Diagnosis Date Noted   Prediabetes 12/15/2022   Chronic cough 08/17/2022   History of allergy to shellfish 02/24/2022   Chronic low back pain of over 3 months duration 02/04/2022   Osteoarthritis  of  AC (acromioclavicular) joint (Left) 02/04/2022   Osteoarthritis of hip (Right) 02/04/2022   Impaction fracture of distal end of radius and ulna, sequela (Left) 02/04/2022   Osteoarthritis of 1st (thumb) carpometacarpal (CMC) joint of hand (Left) 02/04/2022   Lumbosacral facet arthropathy (Multilevel) (Bilateral) (T12-S1) 02/04/2022   Lumbar central spinal stenosis w/o neurogenic claudication (L2-3, L3-4, L4-5) 02/04/2022   Lumbar foraminal stenosis (Bilateral: L2-3, L4-5) (Right: (Severe) L4-5) 02/04/2022   Lumbar lateral recess stenosis (Bilateral: L2-3, L3-4, and  L4-5) (Severe) 02/04/2022   DDD (degenerative disc disease), lumbar 02/04/2022   Lumbosacral facet syndrome (Multilevel) (Bilateral) 02/04/2022   Osteoarthritis of lumbar spine 02/04/2022   Allergic conjunctivitis of both eyes 12/23/2021   Chronic hip pain (3ry area of Pain) (Bilateral) (L>R) 12/22/2021   Chronic lower extremity pain (2ry area of Pain) (Bilateral) (L>R) 12/22/2021   Abnormal MRI, lumbar spine (01/31/2020) 12/22/2021   Grade 1 Anterolisthesis of lumbar spine (L3/L4) & (5 mm) (L4/L5) 12/22/2021   Chronic pain syndrome 11/17/2021   High risk medication use 11/17/2021   Disorder of skeletal system 11/17/2021   Problems influencing health status 11/17/2021   Other hyperlipidemia 10/21/2021   Polypharmacy 08/12/2021   Chronic low back pain (1ry area of Pain) (Bilateral) (L>R) w/o sciatica 06/18/2021   Osteopenia of neck of femur (Right) 09/06/2020   Obesity 08/29/2020   Parkinson disease 02/22/2020   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   Bipolar disorder, in full remission, most recent episode depressed 11/08/2019   Vitamin D insufficiency 08/24/2019   Insomnia due to mental condition 05/31/2019   Bipolar I disorder, most recent episode depressed 05/31/2019   Alcohol use disorder, moderate, in sustained remission 05/31/2019   Elevated tumor markers 05/27/2019   GAD (generalized anxiety disorder) 04/10/2019   MDD (major depressive disorder), severe 01/31/2019   High serum carbohydrate antigen 19-9 (CA19-9) 12/01/2018   Adjustment disorder with anxious mood 11/24/2018   Essential hypertension 11/24/2018   B12 deficiency 10/17/2018   Incisional hernia, without obstruction or gangrene 09/12/2018   Genetic testing 04/07/2018   Family history of breast cancer 03/24/2018   Family history of lung cancer 03/24/2018   Malignant neoplasm of upper-outer quadrant of right breast in female, estrogen receptor positive 03/17/2018   Malignant neoplasm of upper-inner quadrant of right  breast in female, estrogen receptor positive 03/17/2018   History of psychosis 02/07/2018   History of insomnia 02/07/2018    ONSET DATE: Pt diagnosed with PD in 2019  REFERRING DIAG:  G20 (ICD-10-CM) - Parkinson's disease      THERAPY DIAG:  Muscle weakness (generalized)  Difficulty in walking, not elsewhere classified  Pain in left hip  Unsteadiness on feet  Rationale for Evaluation and Treatment: Rehabilitation  SUBJECTIVE: Pt reports ongoing L hip pain. She reports no other concerns/updates.                                                                             Pain: no pain currently Pt accompanied by: significant other self  PERTINENT HISTORY:  Pt is a pleasant 77 yo female who was diagnosed with PD in 2019. She is known to clinic and [facility], and was last seen for LSVT BIG Sept-Oct 2022. Pt with chronic  pain and has become increasingly sedentary as a result exacerbating mobility difficulties due to PD. Current concerns: Pt reports L and R hip pain and back pain. Pt was being seen by pain management clinic. Pt has had injections that provided relief at the time. Pt's spouse reports her physician determined surgery was not an option for her pain so the plan is to manage it. Pt spouse thinks pain has resulted in reluctance for pt to move. Pt currently sedentary, doesn't walk much, but when she does she holds on to furniture for support due to balance problems. When pt first stands she must hold on to something due to mix of pain and/or dizziness (described as lightheadedness). Her pain can go from 3/10 to 9/10. It does improve once she starts moving. Pt has increased difficulty with multiple activities including: getting in and out of her bed, getting in and out of her car, freezing, decreased gait speed, decreased balance and cognition, and difficulty using BUE due to tremors.  Per chart other PMH includes hx of breast cancer, HTN, MCI, insomnia, cataracts, bipolar 1, alcohol  use disorder, GAD, MDD, osteopenia of neck of R femur, chronic B low back pain with radiating pain to buttock and posterior thighs and calves. Pt with multiple comorbidities, please refer to chart for full details    PAIN: from eval Are you having pain? Yes: NPRS scale: not rated currently but can reach 9/10 Pain location: L hip and L leg Pain description: pt has difficulty describing Aggravating factors: unsure Relieving factors: laying in the recliner helps, movement  PRECAUTIONS: Fall  WEIGHT BEARING RESTRICTIONS: No  FALLS: Has patient fallen in last 6 months? Yes. Number of falls 1x, leg tangled in chair leg when getting up from a chair  PLOF: Independent with basic ADLs  PATIENT GOALS: improve mobility   BLE Strength Assessment      Right  Left  Hip Flexion 5/5 4/5 (painful)  Hip Horizontal ABDCT  5/5 5/5  Hip Horizontal ADD 4+/5 4+/5  Hip IR (seated)  4+/5 4+/5  Hip ER (seated) 5/5 5/5  Knee Extension 5/5 5/5  Knee Flexion (seated) 4+/5  5/5 (not painful)  Hip ABDCT (supine)  3+/5 4-/5   Focal popliteal edema Left knee, not painful   ROM Assessment Hip     Right  Left   Flexion Supine 132 120 (ouch)  External rotation (90/90) 30 47  Internal Rotation (90/90) 40  20  Supine Hip Extension P/ROM >0 degrees >0 degrees       IMAGING: via chart   07/24/22 DG BONE density " ASSESSMENT: The probability of a major osteoporotic fracture is 15.8% within the next ten years.   The probability of a hip fracture is 2.9% within the next ten years."   12/22/21 DG lumbar spine " IMPRESSION: 1. No acute displaced fracture or traumatic listhesis of the lumbar spine. 2. Interval development of grade 1 anterolisthesis of L3 on L4. Stable grade 1 anterolisthesis of L4 on L5. Appears stable on flexion extension views. 3.  Aortic Atherosclerosis (ICD10-I70.0)."   New from chart:  DG Lumbar spine 01/05/23: "IMPRESSION: 1. Lumbar spine scoliosis concave right. Diffuse  multilevel degenerative change. Disc degeneration most prominent at L2-L3 and L5-S1. 5 mm anterolisthesis L4 on L5. Diffuse severe facet hypertrophy. No flexion or extension abnormality identified. No evidence of fracture. 2. Sclerotic densities are noted in the L2 and L4 vertebral bodies. Although these could represent bone islands blastic metastatic disease cannot be excluded. MRI  of the lumbar spine suggested for further evaluation. 3. Large amount of stool noted throughout the colon. 4. Aortoiliac atherosclerotic vascular disease."    DG hip 01/05/23: "IMPRESSION: Moderate bilateral femoroacetabular osteoarthritis, similar to prior."   PATIENT EDUCATION:  Education details: Pt educated throughout session about proper posture and technique with exercises. Improved exercise technique, movement at target joints, use of target muscles after min to mod verbal, visual, tactile cues. LSVT BIG interventions Person educated: Patient and Spouse Education method: Explanation, Demonstration, and Verbal cues Education comprehension: verbalized understanding, returned demonstration, verbal cues required, tactile cues required, and needs further education   TODAY'S TREATMENT:                                                                                                                                         DATE: 02/18/23  TE Circuit- 1 4# AW donned each LE 3 rounds of the following to promote LE mm endurance and cardioresp endurance:  Amb with 4WW 296 ft - completes without rest break  12 STS (large amplitude)  LAQ 12x each LE  Seated side step 12x each LE   Circuit- 2 4# AW donned each LE 3 rounds of the following to promote LE mm endurance and cardioresp endurance:  Amb with 4WW 296 ft - completes without rest break  Seated RTB hamstring curls  10x each LE  Seated hamstring stretch 30 sec each LE STS with overhead raise of 1.5# bar 9x   Circuit- 3 4# AW donned each LE 3 rounds  of the following to promote LE mm endurance and cardioresp endurance:  Amb with 4WW 296 ft - completes without rest break  6 STS (large amplitude)  LAQ 10x each LE  Seated side step 10x each LE Seated RTB hamstring curls  10x each LE      Brief rest breaks between sets due to fatigue    HOME EXERCISE PROGRAM:   Access Code: [email or web address] URL: [email or web address] Date: 12/22/2022 Prepared by: Temple Pacini  Exercises - Sit to Stand with Counter Support  - 1 x daily - 5 x weekly - 3 sets - 10 reps  GOALS: Goals reviewed with patient? Yes  SHORT TERM GOALS: Target date: 10/27/2022    Patient will be independent in home exercise program to improve strength/mobility for better functional independence with ADLs. Baseline: LSVT BIG protocol to be initiated next session; 12/18: not yet fully indep, reports she still gets confused about the technique; 12/03/2022: HEP to be updated; Pt reports practicing HEP 2x/week Goal status: IN PROGRESS   LONG TERM GOALS: Target date: 11/11/2022   Patient will increase FOTO score to equal to or greater than 49 to demonstrate statistically significant improvement in mobility and quality of life.  Baseline: 40; 12/18: deferred due to time; 45; 3/19: deferred to next visit; 01/21/2023 Goal status: PARTIALLY MET  2.  Patient (> 61 years old) will  complete five times sit to stand test in < 15 seconds indicating decreased fall risk.  Baseline: 27 sec; 12/18 23 sec hands-free; 11/04/22: 24 sec hands free; 2/1/124: 22.7 sec use of BUE, 21.3 sec hands-free; 01/19/23: 18 seconds hands-free Goal status: IN PROGRESS  3.  Patient will increase 10 meter walk test to >1.42m/s with WFL B step-length, upright posture for >90% of test to improve ease with gait and to override hypokentic and bradykinetic movement.  Baseline: 0.58 m/s with 1OX; 12/18: 0.7 m/s with 0RU; 11/04/22: 0.69 m/s with 0AV;  12/03/22: 0.73 m/s with 4UJ; 01/19/2023: 0.77 m/s with 4WW Goal  status: IN PROGRESS  4.  Patient will reduce time to don jacket by at least 5 seconds in order to increase ease with dressing and override bradykinetic movement Baseline: 17 seconds; 12/18: 12 seconds Goal status: MET  5.  Pt will demo or report ability to open standard jar using only 1-2 attempts in order to exhibit improved functional mobility for ADLs.  Baseline: 5.7 seconds with multiple attempts to turn jar lid to open; 12/18: 2.8 sec opens in 2 turns Goal status: MET  6.  Patient will increase Berg Balance score by > 6 points to demonstrate decreased fall risk during functional activities. Baseline: to be tested next 1-2 visits; 12/03/2022: 44/56; 01/21/23 45/56 Goal status: In Progress  7.  Patient will report at least 4 days in a row with minimal to no L hip pain in order to improve QOL and ease/safety with functional mobility. Baseline: Pt reports pain majority of days in L hip; 12/03/2022: pt reports having hip pain about every day; 3/19: rates hip pain daily (being seen now by MD, new imaging)  Goal status: In Progress  ASSESSMENT:  CLINICAL IMPRESSION: Session somewhat limited secondary to pt late arrival. Pt shows progress today by ambulating 296 ft without taking a rest break, indicating increased activity tolerance/endurance. Therex and endurance training to be advanced next visits as pt able. The pt will benefit from further skilled PT to improve strength, balance, gait and mobility.  OBJECTIVE IMPAIRMENTS: Abnormal gait, decreased balance, decreased cognition, decreased coordination, decreased endurance, decreased knowledge of use of DME, decreased mobility, difficulty walking, decreased strength, dizziness, hypomobility, impaired UE functional use, improper body mechanics, postural dysfunction, and pain.   ACTIVITY LIMITATIONS: lifting, bending, standing, squatting, stairs, transfers, bed mobility, bathing, dressing, hygiene/grooming, and locomotion level  PARTICIPATION  LIMITATIONS: meal prep, cleaning, laundry, medication management, personal finances, driving, shopping, community activity, and yard work  PERSONAL FACTORS: Age, Fitness, Sex, Time since onset of injury/illness/exacerbation, and 3+ comorbidities: PMH includes hx of breast cancer, HTN, MCI, insomnia, cataracts, bipolar 1, alcohol use disorder, GAD, MDD, osteopenia of neck of R femur, chronic B low back pain with radiating pain to buttock and posterior thighs and calves. Pt with multiple comorbidities, please refer to chart for full details  are also affecting patient's functional outcome.   REHAB POTENTIAL: Fair    CLINICAL DECISION MAKING: Evolving/moderate complexity  EVALUATION COMPLEXITY: High  PLAN:  PT FREQUENCY: 2x/week  PT DURATION: 8 weeks  PLANNED INTERVENTIONS: Therapeutic exercises, Therapeutic activity, Neuromuscular re-education, Balance training, Gait training, Patient/Family education, Self Care, Joint mobilization, Stair training, Vestibular training, Canalith repositioning, Visual/preceptual remediation/compensation, Orthotic/Fit training, DME instructions, Wheelchair mobility training, Spinal mobilization, Cryotherapy, Moist heat, Splintting, Taping, and Manual therapy  PLAN FOR NEXT SESSION: hamtrings loading, hip strength, mobility, endurance, gait and balance   2:08 PM, 02/18/23 Temple Pacini PT, DPT  Physical Therapist - [place]  [facility]  Outpatient Physical Therapy- [facility] [phone]     Baird Kay, [place] 02/18/2023, 2:08 PM

## 2023-02-23 ENCOUNTER — Telehealth: Payer: Self-pay

## 2023-02-23 ENCOUNTER — Ambulatory Visit: Payer: Medicare PPO

## 2023-02-23 DIAGNOSIS — R2681 Unsteadiness on feet: Secondary | ICD-10-CM

## 2023-02-23 DIAGNOSIS — M6281 Muscle weakness (generalized): Secondary | ICD-10-CM

## 2023-02-23 DIAGNOSIS — R262 Difficulty in walking, not elsewhere classified: Secondary | ICD-10-CM

## 2023-02-23 DIAGNOSIS — R42 Dizziness and giddiness: Secondary | ICD-10-CM | POA: Diagnosis not present

## 2023-02-23 DIAGNOSIS — M25552 Pain in left hip: Secondary | ICD-10-CM | POA: Diagnosis not present

## 2023-02-23 DIAGNOSIS — R278 Other lack of coordination: Secondary | ICD-10-CM | POA: Diagnosis not present

## 2023-02-23 NOTE — Therapy (Signed)
OUTPATIENT NEURO PHYSICAL THERAPY TREATMENT/Physical Therapy Progress Note   Dates of reporting period  12/10/2022   to   02/23/2023    Patient Name: Kendra [name] MRN: [id number] DOB:06-30-1946, 77 y.o., female Today's Date: 02/23/2023  PCP: Erasmo Downer, MD REFERRING PROVIDER: Erasmo Downer, MD  END OF SESSION:  PT End of Session - 02/23/23 1304     Visit Number 30    Number of Visits 39    Date for PT Re-Evaluation 03/16/23    Authorization Type [employer] Medicare Choice PPO    Authorization Time Period 11/04/2022-12/30/2022    Progress Note Due on Visit 30    PT Start Time 1301    PT Stop Time 1345    PT Time Calculation (min) 44 min    Equipment Utilized During Treatment Gait belt    Activity Tolerance Patient tolerated treatment well;Patient limited by fatigue    Behavior During Therapy WFL for tasks assessed/performed                      Past Medical History:  Diagnosis Date   Anxiety    Breast cancer 03/2018   right breast cancer at 11:00 and 1:00   Bursitis    bilateral hips and knees   Complication of anesthesia    Depression    Family history of breast cancer 03/24/2018   Family history of lung cancer 03/24/2018   History of alcohol dependence 2007   no ETOH; resolved since 2007   History of hiatal hernia    History of psychosis 2014   due to sleep disturbance   Hypertension    Parkinson disease    Personal history of radiation therapy 06/2018-07/2018   right breast ca   PONV (postoperative nausea and vomiting)    Past Surgical History:  Procedure Laterality Date   BREAST BIOPSY Right 03/14/2018   11:00 DCIS and invasive ductal carcinoma   BREAST BIOPSY Right 03/14/2018   1:00 Invasive ductal carcinoma   BREAST LUMPECTOMY Right 04/27/2018   lumpectomy of 11 and 1:00 cancers, clear margins, negative LN   BREAST LUMPECTOMY WITH RADIOACTIVE SEED AND SENTINEL LYMPH NODE BIOPSY Right 04/27/2018   Procedure: RIGHT BREAST LUMPECTOMY  WITH RADIOACTIVE SEED X 2 AND RIGHT SENTINEL LYMPH NODE BIOPSY;  Surgeon: Glenna Fellows, MD;  Location: [facility];  Service: General;  Laterality: Right;   CATARACT EXTRACTION W/PHACO Left 08/30/2019   Procedure: CATARACT EXTRACTION PHACO AND INTRAOCULAR LENS PLACEMENT (IOC) LEFT panoptix toric  01:20.5  12.7%  10.26;  Surgeon: Lockie Mola, MD;  Location: [facility];  Service: Ophthalmology;  Laterality: Left;   CATARACT EXTRACTION W/PHACO Right 09/20/2019   Procedure: CATARACT EXTRACTION PHACO AND INTRAOCULAR LENS PLACEMENT (IOC) RIGHT 5.71  00:36.4  15.7%;  Surgeon: Lockie Mola, MD;  Location: [facility];  Service: Ophthalmology;  Laterality: Right;   LAPAROTOMY N/A 02/10/2018   Procedure: EXPLORATORY LAPAROTOMY;  Surgeon: Shonna Chock, MD;  Location: [facility];  Service: Gynecology;  Laterality: N/A;   MASS EXCISION  01/2018   abdominal   OVARIAN CYST REMOVAL     SALPINGOOPHORECTOMY Bilateral 02/10/2018   Procedure: BILATERAL SALPINGO OOPHORECTOMY; PERITONEAL WASHINGS;  Surgeon: Shonna Chock, MD;  Location: [facility];  Service: Gynecology;  Laterality: Bilateral;   TONSILLECTOMY     TONSILLECTOMY AND ADENOIDECTOMY  1953   Patient Active Problem List   Diagnosis Date Noted   Prediabetes 12/15/2022   Chronic cough 08/17/2022   History of allergy  to shellfish 02/24/2022   Chronic low back pain of over 3 months duration 02/04/2022   Osteoarthritis of  AC (acromioclavicular) joint (Left) 02/04/2022   Osteoarthritis of hip (Right) 02/04/2022   Impaction fracture of distal end of radius and ulna, sequela (Left) 02/04/2022   Osteoarthritis of 1st (thumb) carpometacarpal (CMC) joint of hand (Left) 02/04/2022   Lumbosacral facet arthropathy (Multilevel) (Bilateral) (T12-S1) 02/04/2022   Lumbar central spinal stenosis w/o neurogenic claudication (L2-3, L3-4, L4-5) 02/04/2022   Lumbar foraminal stenosis (Bilateral: L2-3, L4-5) (Right:  (Severe) L4-5) 02/04/2022   Lumbar lateral recess stenosis (Bilateral: L2-3, L3-4, and L4-5) (Severe) 02/04/2022   DDD (degenerative disc disease), lumbar 02/04/2022   Lumbosacral facet syndrome (Multilevel) (Bilateral) 02/04/2022   Osteoarthritis of lumbar spine 02/04/2022   Allergic conjunctivitis of both eyes 12/23/2021   Chronic hip pain (3ry area of Pain) (Bilateral) (L>R) 12/22/2021   Chronic lower extremity pain (2ry area of Pain) (Bilateral) (L>R) 12/22/2021   Abnormal MRI, lumbar spine (01/31/2020) 12/22/2021   Grade 1 Anterolisthesis of lumbar spine (L3/L4) & (5 mm) (L4/L5) 12/22/2021   Chronic pain syndrome 11/17/2021   High risk medication use 11/17/2021   Disorder of skeletal system 11/17/2021   Problems influencing health status 11/17/2021   Other hyperlipidemia 10/21/2021   Polypharmacy 08/12/2021   Chronic low back pain (1ry area of Pain) (Bilateral) (L>R) w/o sciatica 06/18/2021   Osteopenia of neck of femur (Right) 09/06/2020   Obesity 08/29/2020   Parkinson disease 02/22/2020   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   Bipolar disorder, in full remission, most recent episode depressed 11/08/2019   Vitamin D insufficiency 08/24/2019   Insomnia due to mental condition 05/31/2019   Bipolar I disorder, most recent episode depressed 05/31/2019   Alcohol use disorder, moderate, in sustained remission 05/31/2019   Elevated tumor markers 05/27/2019   GAD (generalized anxiety disorder) 04/10/2019   MDD (major depressive disorder), severe 01/31/2019   High serum carbohydrate antigen 19-9 (CA19-9) 12/01/2018   Adjustment disorder with anxious mood 11/24/2018   Essential hypertension 11/24/2018   B12 deficiency 10/17/2018   Incisional hernia, without obstruction or gangrene 09/12/2018   Genetic testing 04/07/2018   Family history of breast cancer 03/24/2018   Family history of lung cancer 03/24/2018   Malignant neoplasm of upper-outer quadrant of right breast in female, estrogen  receptor positive 03/17/2018   Malignant neoplasm of upper-inner quadrant of right breast in female, estrogen receptor positive 03/17/2018   History of psychosis 02/07/2018   History of insomnia 02/07/2018    ONSET DATE: Pt diagnosed with PD in 2019  REFERRING DIAG:  G20 (ICD-10-CM) - Parkinson's disease      THERAPY DIAG:  Difficulty in walking, not elsewhere classified  Muscle weakness (generalized)  Unsteadiness on feet  Pain in left hip  Rationale for Evaluation and Treatment: Rehabilitation  SUBJECTIVE: Pt reports ongoing L hip pain. She reports no other concerns/updates.                                                                             Pain: no pain currently Pt accompanied by: significant other self  PERTINENT HISTORY:  Pt is a pleasant 77 yo female who was diagnosed with PD in 2019. She  is known to clinic and [facility], and was last seen for LSVT BIG Sept-Oct 2022. Pt with chronic pain and has become increasingly sedentary as a result exacerbating mobility difficulties due to PD. Current concerns: Pt reports L and R hip pain and back pain. Pt was being seen by pain management clinic. Pt has had injections that provided relief at the time. Pt's spouse reports her physician determined surgery was not an option for her pain so the plan is to manage it. Pt spouse thinks pain has resulted in reluctance for pt to move. Pt currently sedentary, doesn't walk much, but when she does she holds on to furniture for support due to balance problems. When pt first stands she must hold on to something due to mix of pain and/or dizziness (described as lightheadedness). Her pain can go from 3/10 to 9/10. It does improve once she starts moving. Pt has increased difficulty with multiple activities including: getting in and out of her bed, getting in and out of her car, freezing, decreased gait speed, decreased balance and cognition, and difficulty using BUE due to tremors.  Per chart other  PMH includes hx of breast cancer, HTN, MCI, insomnia, cataracts, bipolar 1, alcohol use disorder, GAD, MDD, osteopenia of neck of R femur, chronic B low back pain with radiating pain to buttock and posterior thighs and calves. Pt with multiple comorbidities, please refer to chart for full details    PAIN: from eval Are you having pain? Yes: NPRS scale: not rated currently but can reach 9/10 Pain location: L hip and L leg Pain description: pt has difficulty describing Aggravating factors: unsure Relieving factors: laying in the recliner helps, movement  PRECAUTIONS: Fall  WEIGHT BEARING RESTRICTIONS: No  FALLS: Has patient fallen in last 6 months? Yes. Number of falls 1x, leg tangled in chair leg when getting up from a chair  PLOF: Independent with basic ADLs  PATIENT GOALS: improve mobility   BLE Strength Assessment      Right  Left  Hip Flexion 5/5 4/5 (painful)  Hip Horizontal ABDCT  5/5 5/5  Hip Horizontal ADD 4+/5 4+/5  Hip IR (seated)  4+/5 4+/5  Hip ER (seated) 5/5 5/5  Knee Extension 5/5 5/5  Knee Flexion (seated) 4+/5  5/5 (not painful)  Hip ABDCT (supine)  3+/5 4-/5   Focal popliteal edema Left knee, not painful   ROM Assessment Hip     Right  Left   Flexion Supine 132 120 (ouch)  External rotation (90/90) 30 47  Internal Rotation (90/90) 40  20  Supine Hip Extension P/ROM >0 degrees >0 degrees       IMAGING: via chart   07/24/22 DG BONE density " ASSESSMENT: The probability of a major osteoporotic fracture is 15.8% within the next ten years.   The probability of a hip fracture is 2.9% within the next ten years."   12/22/21 DG lumbar spine " IMPRESSION: 1. No acute displaced fracture or traumatic listhesis of the lumbar spine. 2. Interval development of grade 1 anterolisthesis of L3 on L4. Stable grade 1 anterolisthesis of L4 on L5. Appears stable on flexion extension views. 3.  Aortic Atherosclerosis (ICD10-I70.0)."   New from chart:  DG Lumbar  spine 01/05/23: "IMPRESSION: 1. Lumbar spine scoliosis concave right. Diffuse multilevel degenerative change. Disc degeneration most prominent at L2-L3 and L5-S1. 5 mm anterolisthesis L4 on L5. Diffuse severe facet hypertrophy. No flexion or extension abnormality identified. No evidence of fracture. 2. Sclerotic densities are noted in the  L2 and L4 vertebral bodies. Although these could represent bone islands blastic metastatic disease cannot be excluded. MRI of the lumbar spine suggested for further evaluation. 3. Large amount of stool noted throughout the colon. 4. Aortoiliac atherosclerotic vascular disease."    DG hip 01/05/23: "IMPRESSION: Moderate bilateral femoroacetabular osteoarthritis, similar to prior."   PATIENT EDUCATION:  Education details: Pt educated throughout session about proper posture and technique with exercises. Improved exercise technique, movement at target joints, use of target muscles after min to mod verbal, visual, tactile cues. LSVT BIG interventions Person educated: Patient and Spouse Education method: Explanation, Demonstration, and Verbal cues Education comprehension: verbalized understanding, returned demonstration, verbal cues required, tactile cues required, and needs further education   TODAY'S TREATMENT:                                                                                                                                         DATE: 02/23/23  Therapeutic activity: Goal retesting completed on this date. PT instructs pt in technique and indications of test performance. Please refer to goal section below for details.    HOME EXERCISE PROGRAM:   Access Code: [email or web address] URL: [email or web address] Date: 12/22/2022 Prepared by: Temple Pacini  Exercises - Sit to Stand with Counter Support  - 1 x daily - 5 x weekly - 3 sets - 10 reps  GOALS: Goals reviewed with patient? Yes  SHORT TERM GOALS: Target date:  10/27/2022    Patient will be independent in home exercise program to improve strength/mobility for better functional independence with ADLs. Baseline: LSVT BIG protocol to be initiated next session; 12/18: not yet fully indep, reports she still gets confused about the technique; 12/03/2022: HEP to be updated; Pt reports practicing HEP 2x/week; 02/23/23: not currently performing HEP, reviewed in session and provided new copy  Goal status: IN PROGRESS   LONG TERM GOALS: Target date: 11/11/2022   Patient will increase FOTO score to equal to or greater than 49 to demonstrate statistically significant improvement in mobility and quality of life.  Baseline: 40; 12/18: deferred due to time; 45; 3/19: deferred to next visit; 01/21/2023; 02/23/2023: 46 (unchanged from previous assessment) Goal status: PARTIALLY MET  2.  Patient (> 64 years old) will complete five times sit to stand test in < 15 seconds indicating decreased fall risk.  Baseline: 27 sec; 12/18 23 sec hands-free; 11/04/22: 24 sec hands free; 2/1/124: 22.7 sec use of BUE, 21.3 sec hands-free; 01/19/23: 18 seconds hands-free; 02/23/23: 19 seconds hands-free Goal status: IN PROGRESS  3.  Patient will increase 10 meter walk test to >1.52m/s with WFL B step-length, upright posture for >90% of test to improve ease with gait and to override hypokentic and bradykinetic movement.  Baseline: 0.58 m/s with 5WU; 12/18: 0.7 m/s with 9WJ; 11/04/22: 0.69 m/s with 1BJ;  12/03/22: 0.73 m/s with 4NW; 01/19/2023: 0.77 m/s with 2NF; 02/23/2023: 0.83 m/s with 6OZ  Goal status: IN PROGRESS  4.  Patient will reduce time to don jacket by at least 5 seconds in order to increase ease with dressing and override bradykinetic movement Baseline: 17 seconds; 12/18: 12 seconds Goal status: MET  5.  Pt will demo or report ability to open standard jar using only 1-2 attempts in order to exhibit improved functional mobility for ADLs.  Baseline: 5.7 seconds with multiple attempts to  turn jar lid to open; 12/18: 2.8 sec opens in 2 turns Goal status: MET  6.  Patient will increase Berg Balance score by > 6 points to demonstrate decreased fall risk during functional activities. Baseline: to be tested next 1-2 visits; 12/03/2022: 44/56; 01/21/23 45/56; 48/56  Goal status: In Progress  7.  Patient will report at least 4 days in a row with minimal to no L hip pain in order to improve QOL and ease/safety with functional mobility. Baseline: Pt reports pain majority of days in L hip; 12/03/2022: pt reports having hip pain about every day; 3/19: rates hip pain daily (being seen now by MD, new imaging); 02/23/2023: pt went for period of time without hip pain (she thinks a week), but reports it has now come back, Goal status: In Progress  8. Patient will increase six minute walk test distance to >1000 for progression to community ambulator and improve gait ability Baseline:02/23/2023: 722 ft with 4WW and two standing rest breaks, rates test as hard Goal status: NEW  ASSESSMENT:  CLINICAL IMPRESSION: Goals reassessed for progress note. Pt making gains AEB improvement on and BERG. Pt with similar but 1 sec decrease on 5xSTS today and no change in FOTO score. New goal added as pt now able to ambulate for further distances and was able to complete test. While pt now able to complete test, her score still indicates impairment in gait ability. The pt will benefit from further skilled PT to improve strength, balance, gait and mobility.  OBJECTIVE IMPAIRMENTS: Abnormal gait, decreased balance, decreased cognition, decreased coordination, decreased endurance, decreased knowledge of use of DME, decreased mobility, difficulty walking, decreased strength, dizziness, hypomobility, impaired UE functional use, improper body mechanics, postural dysfunction, and pain.   ACTIVITY LIMITATIONS: lifting, bending, standing, squatting, stairs, transfers, bed mobility, bathing, dressing,  hygiene/grooming, and locomotion level  PARTICIPATION LIMITATIONS: meal prep, cleaning, laundry, medication management, personal finances, driving, shopping, community activity, and yard work  PERSONAL FACTORS: Age, Fitness, Sex, Time since onset of injury/illness/exacerbation, and 3+ comorbidities: PMH includes hx of breast cancer, HTN, MCI, insomnia, cataracts, bipolar 1, alcohol use disorder, GAD, MDD, osteopenia of neck of R femur, chronic B low back pain with radiating pain to buttock and posterior thighs and calves. Pt with multiple comorbidities, please refer to chart for full details  are also affecting patient's functional outcome.   REHAB POTENTIAL: Fair    CLINICAL DECISION MAKING: Evolving/moderate complexity  EVALUATION COMPLEXITY: High  PLAN:  PT FREQUENCY: 2x/week  PT DURATION: 8 weeks  PLANNED INTERVENTIONS: Therapeutic exercises, Therapeutic activity, Neuromuscular re-education, Balance training, Gait training, Patient/Family education, Self Care, Joint mobilization, Stair training, Vestibular training, Canalith repositioning, Visual/preceptual remediation/compensation, Orthotic/Fit training, DME instructions, Wheelchair mobility training, Spinal mobilization, Cryotherapy, Moist heat, Splintting, Taping, and Manual therapy  PLAN FOR NEXT SESSION: hamtrings loading, hip strength, mobility, endurance, gait and balance   5:31 PM, 02/23/23 Temple Pacini PT, DPT  Physical Therapist - [facility]  Outpatient Physical Therapy- Main Campus [phone]     Baird Kay, PT  02/23/2023, 5:31 PM

## 2023-02-23 NOTE — Progress Notes (Signed)
Care Management & Coordination Services Pharmacy Team Pharmacy Assistant   Name: Kendra [name]  MRN: [id number] DOB: December 08, 1945   Reason for Encounter: Appointment Reminder  Contacted patient to confirm telephone appointment with Angelena Sole, PharmD on 02/25/2023 at 0915. {US HC Outreach:[id number]}  Primary concerns for visit include: ***   Chart review: Conditions to be addressed/monitored: HTN, Anxiety, Depression, Bipolar Disorder, Osteopenia, Osteoarthritis, and DDD, Chronic Low Back Pain, Chronic Pain Syndrome, Chronic Hip Pain, Insomnia due to mental condition, Other Hyperlipidemia  Recent office visits:  02/09/2023 Shirlee Latch, MD (PCP Office Visit) for Medicare Wellness Exam- Started: Losartan Potassium-HCTZ 50-12.5 mg 1 tablet daily, Stopped: Lisinopril 20-12.5 mg, No orders placed, Patient to follow-up in 3 months  12/15/2022 Shirlee Latch, MD (PCP Office Visit) for-UTI- Started: Cephalexin 500 mg 3 times daily, Stopped: Famotidine 20 mg, Diclofenac, Lab orders placed,  11/06/2022 Shirlee Latch, MD (PCP Office Visit) for Hypertension- Stopped: Cefodizime, Meloxicam, Polyethylene, Tramadol, Started: Famotidine 20 mg twice daily, Lab orders placed, Patient to follow-up in 3 months  09/10/2022 Debera Lat, PA-C (PCP Office Visit) for Dysuria- No medication changes noted, Lab orders placed, No follow-up noted  08/25/2022 Ellwood Dense, DO (PCP Office Visit) for UTI- Started: Nitrofurantoin Monohyd Macro 100 mg twice daily, Lab order placed, No follow-up noted  Recent consult visits:  01/26/2023 Delano Metz, MD (Pain Management) for Back Pain- No medication changes noted, No orders placed, BP: 137/95, No follow-up noted  01/04/2023 Delano Metz, MD (Pain Management) for Hip Pain- Stopped: Nystatin, Order placed for HIP XR Lumbar XR, BP: 166/67, No follow-up noted  01/04/2023 Jomarie Longs, MD (Behavioral Health) I did not break the glass so I do not  know of the updates regarding this visit  11/09/2022 Janice Coffin, PA (Neurology) for Tremors- No medication changes noted, Lumbar order placed, patietn to follow-up in 6 months  10/06/2022 Temple Pacini, PT (Outpatient Rehab) for PT Treatment- No medication changes noted, No orders placed-Patient is doing PT and Speech Therapy several times a week  10/05/2022 Temple Pacini, PT (Outpatient Rehab) for PT Treatment- No medication changes noted, No orders placed  10/05/2022 Marquita Palms CCC-SLP (Outpatient Rehab) for SLP Tretment- No medication changes noted, No orders placed  09/29/2022 Marquita Palms CCC-SLP (Outpatient Rehab) for SLP Initial Evaluation- No medication changes noted, SLP Plan of care order placed  09/29/2022 Temple Pacini, PT (Outpatient Rehab) for Initial Evaluation- No medication changes noted, PT Plan of care order placed  09/28/2022 [facility])  I did not break the glass so I do not know of the updates regarding this visit  09/04/2022 Jomarie Longs, MD (Behavioral Health)  I did not break the glass so I do not know of the updates regarding this visit  08/28/2022 Louretta Shorten, MD (Oncology) for Malignant Neoplasm of upper-inner quadrant R breast- No medication changes noted, Lab orders placed, BP: 144/84, Patient to follow-up in 6 months  Hospital visits:  None in previous 6 months  Medications: Outpatient Encounter Medications as of 02/23/2023  Medication Sig   Acetaminophen 500 MG capsule Take 1,000 mg by mouth 2 (two) times daily.   AMBULATORY NON FORMULARY MEDICATION 1 Bag by Other route daily. HERO smart dispenser   calcium-vitamin D (OSCAL WITH D) 500-5 MG-MCG tablet Take 1 tablet by mouth 2 (two) times daily.   carbidopa-levodopa (SINEMET IR) 25-100 MG tablet Take 1 tablet by mouth daily in the afternoon. In the morning   Carbidopa-Levodopa ER (SINEMET CR) 25-100 MG tablet controlled release Take 1 tablet  by mouth at bedtime.    cyanocobalamin (,VITAMIN B-12,) 1000 MCG/ML injection INJECT INTO THE MUSCLE EVERY 30 DAYS   denosumab (PROLIA) 60 MG/ML SOSY injection Inject 60 mg into the skin every 6 (six) months. takes annually   FLUoxetine HCl 60 MG TABS Take 1 tablet by mouth daily.   gabapentin (NEURONTIN) 100 MG capsule Take 3 capsules (300 mg total) by mouth 3 (three) times daily. (Patient taking differently: Take 100 mg by mouth 3 (three) times daily.)   letrozole (FEMARA) 2.5 MG tablet Take 1 tablet (2.5 mg total) by mouth daily.   losartan-hydrochlorothiazide (HYZAAR) 50-12.5 MG tablet Take 1 tablet by mouth daily. Stop lisinopril-hctz - this replaced that medication   risperiDONE (RISPERDAL) 0.5 MG tablet Take 1 tablet (0.5 mg total) by mouth 2 (two) times daily.   traZODone (DESYREL) 100 MG tablet Take 1 tablet (100 mg total) by mouth at bedtime.   No facility-administered encounter medications on file as of 02/23/2023.   Do you have any problems getting your medications? {yes/no:20286}  If yes what types of problems are you experiencing? {Problems:27223}  What is your top health concern you would like to discuss at your upcoming visit?   Have you seen any other providers since your last visit with PCP? {yes/no:20286}  Star Rating Drugs:  Losartan-HCTZ 50-12.5 mg last filled on 02/09/2023 for a 90-Day supply with [facility]   Care Gaps: Annual wellness visit in last year? Yes     Adelene Idler, CPA/CMA Clinical Pharmacist Assistant Phone: [phone]

## 2023-02-25 ENCOUNTER — Ambulatory Visit: Payer: Medicare PPO

## 2023-02-25 DIAGNOSIS — R278 Other lack of coordination: Secondary | ICD-10-CM | POA: Diagnosis not present

## 2023-02-25 DIAGNOSIS — M6281 Muscle weakness (generalized): Secondary | ICD-10-CM | POA: Diagnosis not present

## 2023-02-25 DIAGNOSIS — M85851 Other specified disorders of bone density and structure, right thigh: Secondary | ICD-10-CM

## 2023-02-25 DIAGNOSIS — R262 Difficulty in walking, not elsewhere classified: Secondary | ICD-10-CM

## 2023-02-25 DIAGNOSIS — M25552 Pain in left hip: Secondary | ICD-10-CM | POA: Diagnosis not present

## 2023-02-25 DIAGNOSIS — R42 Dizziness and giddiness: Secondary | ICD-10-CM | POA: Diagnosis not present

## 2023-02-25 DIAGNOSIS — R2681 Unsteadiness on feet: Secondary | ICD-10-CM

## 2023-02-25 DIAGNOSIS — I10 Essential (primary) hypertension: Secondary | ICD-10-CM

## 2023-02-25 NOTE — Therapy (Signed)
OUTPATIENT NEURO PHYSICAL THERAPY TREATMENT    Patient Name: Kendra [name] MRN: [id number] DOB:February 05, 1946, 77 y.o., female Today's Date: 02/25/2023  PCP: Erasmo Downer, MD REFERRING PROVIDER: Erasmo Downer, MD  END OF SESSION:  PT End of Session - 02/25/23 1403     Visit Number 31    Number of Visits 39    Date for PT Re-Evaluation 03/16/23    Authorization Type [employer] PPO    Authorization Time Period 11/04/2022-12/30/2022    Progress Note Due on Visit 30    PT Start Time 1303    PT Stop Time 1343    PT Time Calculation (min) 40 min    Equipment Utilized During Treatment Gait belt    Activity Tolerance Patient tolerated treatment well;Patient limited by fatigue    Behavior During Therapy [facility] for tasks assessed/performed                       Past Medical History:  Diagnosis Date   Anxiety    Breast cancer 03/2018   right breast cancer at 11:00 and 1:00   Bursitis    bilateral hips and knees   Complication of anesthesia    Depression    Family history of breast cancer 03/24/2018   Family history of lung cancer 03/24/2018   History of alcohol dependence 2007   no ETOH; resolved since 2007   History of hiatal hernia    History of psychosis 2014   due to sleep disturbance   Hypertension    Parkinson disease    Personal history of radiation therapy 06/2018-07/2018   right breast ca   PONV (postoperative nausea and vomiting)    Past Surgical History:  Procedure Laterality Date   BREAST BIOPSY Right 03/14/2018   11:00 DCIS and invasive ductal carcinoma   BREAST BIOPSY Right 03/14/2018   1:00 Invasive ductal carcinoma   BREAST LUMPECTOMY Right 04/27/2018   lumpectomy of 11 and 1:00 cancers, clear margins, negative LN   BREAST LUMPECTOMY WITH RADIOACTIVE SEED AND SENTINEL LYMPH NODE BIOPSY Right 04/27/2018   Procedure: RIGHT BREAST LUMPECTOMY WITH RADIOACTIVE SEED X 2 AND RIGHT SENTINEL LYMPH NODE BIOPSY;  Surgeon: Glenna Fellows, MD;  Location: [facility];  Service: General;  Laterality: Right;   CATARACT EXTRACTION W/PHACO Left 08/30/2019   Procedure: CATARACT EXTRACTION PHACO AND INTRAOCULAR LENS PLACEMENT (IOC) LEFT panoptix toric  01:20.5  12.7%  10.26;  Surgeon: Lockie Mola, MD;  Location: [facility];  Service: Ophthalmology;  Laterality: Left;   CATARACT EXTRACTION W/PHACO Right 09/20/2019   Procedure: CATARACT EXTRACTION PHACO AND INTRAOCULAR LENS PLACEMENT (IOC) RIGHT 5.71  00:36.4  15.7%;  Surgeon: Lockie Mola, MD;  Location: [facility];  Service: Ophthalmology;  Laterality: Right;   LAPAROTOMY N/A 02/10/2018   Procedure: EXPLORATORY LAPAROTOMY;  Surgeon: Shonna Chock, MD;  Location: [facility];  Service: Gynecology;  Laterality: N/A;   MASS EXCISION  01/2018   abdominal   OVARIAN CYST REMOVAL     SALPINGOOPHORECTOMY Bilateral 02/10/2018   Procedure: BILATERAL SALPINGO OOPHORECTOMY; PERITONEAL WASHINGS;  Surgeon: Shonna Chock, MD;  Location: [facility];  Service: Gynecology;  Laterality: Bilateral;   TONSILLECTOMY     TONSILLECTOMY AND ADENOIDECTOMY  1953   Patient Active Problem List   Diagnosis Date Noted   Prediabetes 12/15/2022   Chronic cough 08/17/2022   History of allergy to shellfish 02/24/2022   Chronic low back pain of over 3 months duration 02/04/2022  Osteoarthritis of  AC (acromioclavicular) joint (Left) 02/04/2022   Osteoarthritis of hip (Right) 02/04/2022   Impaction fracture of distal end of radius and ulna, sequela (Left) 02/04/2022   Osteoarthritis of 1st (thumb) carpometacarpal (CMC) joint of hand (Left) 02/04/2022   Lumbosacral facet arthropathy (Multilevel) (Bilateral) (T12-S1) 02/04/2022   Lumbar central spinal stenosis w/o neurogenic claudication (L2-3, L3-4, L4-5) 02/04/2022   Lumbar foraminal stenosis (Bilateral: L2-3, L4-5) (Right: (Severe) L4-5) 02/04/2022   Lumbar lateral recess stenosis (Bilateral: L2-3, L3-4, and  L4-5) (Severe) 02/04/2022   DDD (degenerative disc disease), lumbar 02/04/2022   Lumbosacral facet syndrome (Multilevel) (Bilateral) 02/04/2022   Osteoarthritis of lumbar spine 02/04/2022   Allergic conjunctivitis of both eyes 12/23/2021   Chronic hip pain (3ry area of Pain) (Bilateral) (L>R) 12/22/2021   Chronic lower extremity pain (2ry area of Pain) (Bilateral) (L>R) 12/22/2021   Abnormal MRI, lumbar spine (01/31/2020) 12/22/2021   Grade 1 Anterolisthesis of lumbar spine (L3/L4) & (5 mm) (L4/L5) 12/22/2021   Chronic pain syndrome 11/17/2021   High risk medication use 11/17/2021   Disorder of skeletal system 11/17/2021   Problems influencing health status 11/17/2021   Other hyperlipidemia 10/21/2021   Polypharmacy 08/12/2021   Chronic low back pain (1ry area of Pain) (Bilateral) (L>R) w/o sciatica 06/18/2021   Osteopenia of neck of femur (Right) 09/06/2020   Obesity 08/29/2020   Parkinson disease 02/22/2020   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   Bipolar disorder, in full remission, most recent episode depressed 11/08/2019   Vitamin D insufficiency 08/24/2019   Insomnia due to mental condition 05/31/2019   Bipolar I disorder, most recent episode depressed 05/31/2019   Alcohol use disorder, moderate, in sustained remission 05/31/2019   Elevated tumor markers 05/27/2019   GAD (generalized anxiety disorder) 04/10/2019   MDD (major depressive disorder), severe 01/31/2019   High serum carbohydrate antigen 19-9 (CA19-9) 12/01/2018   Adjustment disorder with anxious mood 11/24/2018   Essential hypertension 11/24/2018   B12 deficiency 10/17/2018   Incisional hernia, without obstruction or gangrene 09/12/2018   Genetic testing 04/07/2018   Family history of breast cancer 03/24/2018   Family history of lung cancer 03/24/2018   Malignant neoplasm of upper-outer quadrant of right breast in female, estrogen receptor positive 03/17/2018   Malignant neoplasm of upper-inner quadrant of right  breast in female, estrogen receptor positive 03/17/2018   History of psychosis 02/07/2018   History of insomnia 02/07/2018    ONSET DATE: Pt diagnosed with PD in 2019  REFERRING DIAG:  G20 (ICD-10-CM) - Parkinson's disease      THERAPY DIAG:  Muscle weakness (generalized)  Difficulty in walking, not elsewhere classified  Other lack of coordination  Unsteadiness on feet  Rationale for Evaluation and Treatment: Rehabilitation  SUBJECTIVE: Pt reports ongoing L hip pain and no other updates.                                                                             Pain: no pain currently Pt accompanied by: significant other self  PERTINENT HISTORY:  Pt is a pleasant 77 yo female who was diagnosed with PD in 2019. She is known to clinic and [facility], and was last seen for LSVT BIG Sept-Oct 2022. Pt with chronic  pain and has become increasingly sedentary as a result exacerbating mobility difficulties due to PD. Current concerns: Pt reports L and R hip pain and back pain. Pt was being seen by pain management clinic. Pt has had injections that provided relief at the time. Pt's spouse reports her physician determined surgery was not an option for her pain so the plan is to manage it. Pt spouse thinks pain has resulted in reluctance for pt to move. Pt currently sedentary, doesn't walk much, but when she does she holds on to furniture for support due to balance problems. When pt first stands she must hold on to something due to mix of pain and/or dizziness (described as lightheadedness). Her pain can go from 3/10 to 9/10. It does improve once she starts moving. Pt has increased difficulty with multiple activities including: getting in and out of her bed, getting in and out of her car, freezing, decreased gait speed, decreased balance and cognition, and difficulty using BUE due to tremors.  Per chart other PMH includes hx of breast cancer, HTN, MCI, insomnia, cataracts, bipolar 1, alcohol use  disorder, GAD, MDD, osteopenia of neck of R femur, chronic B low back pain with radiating pain to buttock and posterior thighs and calves. Pt with multiple comorbidities, please refer to chart for full details    PAIN: from eval Are you having pain? Yes: NPRS scale: not rated currently but can reach 9/10 Pain location: L hip and L leg Pain description: pt has difficulty describing Aggravating factors: unsure Relieving factors: laying in the recliner helps, movement  PRECAUTIONS: Fall  WEIGHT BEARING RESTRICTIONS: No  FALLS: Has patient fallen in last 6 months? Yes. Number of falls 1x, leg tangled in chair leg when getting up from a chair  PLOF: Independent with basic ADLs  PATIENT GOALS: improve mobility   BLE Strength Assessment      Right  Left  Hip Flexion 5/5 4/5 (painful)  Hip Horizontal ABDCT  5/5 5/5  Hip Horizontal ADD 4+/5 4+/5  Hip IR (seated)  4+/5 4+/5  Hip ER (seated) 5/5 5/5  Knee Extension 5/5 5/5  Knee Flexion (seated) 4+/5  5/5 (not painful)  Hip ABDCT (supine)  3+/5 4-/5   Focal popliteal edema Left knee, not painful   ROM Assessment Hip     Right  Left   Flexion Supine 132 120 (ouch)  External rotation (90/90) 30 47  Internal Rotation (90/90) 40  20  Supine Hip Extension P/ROM >0 degrees >0 degrees       IMAGING: via chart   07/24/22 DG BONE density " ASSESSMENT: The probability of a major osteoporotic fracture is 15.8% within the next ten years.   The probability of a hip fracture is 2.9% within the next ten years."   12/22/21 DG lumbar spine " IMPRESSION: 1. No acute displaced fracture or traumatic listhesis of the lumbar spine. 2. Interval development of grade 1 anterolisthesis of L3 on L4. Stable grade 1 anterolisthesis of L4 on L5. Appears stable on flexion extension views. 3.  Aortic Atherosclerosis (ICD10-I70.0)."   New from chart:  DG Lumbar spine 01/05/23: "IMPRESSION: 1. Lumbar spine scoliosis concave right. Diffuse  multilevel degenerative change. Disc degeneration most prominent at L2-L3 and L5-S1. 5 mm anterolisthesis L4 on L5. Diffuse severe facet hypertrophy. No flexion or extension abnormality identified. No evidence of fracture. 2. Sclerotic densities are noted in the L2 and L4 vertebral bodies. Although these could represent bone islands blastic metastatic disease cannot be excluded. MRI  of the lumbar spine suggested for further evaluation. 3. Large amount of stool noted throughout the colon. 4. Aortoiliac atherosclerotic vascular disease."    DG hip 01/05/23: "IMPRESSION: Moderate bilateral femoroacetabular osteoarthritis, similar to prior."   PATIENT EDUCATION:  Education details: Pt educated throughout session about proper posture and technique with exercises. Improved exercise technique, movement at target joints, use of target muscles after min to mod verbal, visual, tactile cues. LSVT BIG interventions Person educated: Patient and Spouse Education method: Explanation, Demonstration, and Verbal cues Education comprehension: verbalized understanding, returned demonstration, verbal cues required, tactile cues required, and needs further education   TODAY'S TREATMENT:                                                                                                                                         DATE: 02/25/23  TE:  5# AW donned each LE, ambulation for endurance with 4WW 296 ft Completed additional 2x148 ft  brief rest breaks between sets  Seated R ankle pump 20x to assist with decreased swelling (pt reports chronic LE swelling, R foot noted to be somewhat increased today)  5# AW donned: LAQ 2x15 each LE   NMR:  Standing on airex WBOS 1x15 seconds, 3x30 seconds with intermittent UE support Standing at white board spelling out alphabet for dynamic balance challenge, including reaching x 1 round. Intermittent UE support    HOME EXERCISE PROGRAM:   Access Code:  [email or web address] URL: [email or web address] Date: 12/22/2022 Prepared by: Temple Pacini  Exercises - Sit to Stand with Counter Support  - 1 x daily - 5 x weekly - 3 sets - 10 reps  GOALS: Goals reviewed with patient? Yes  SHORT TERM GOALS: Target date: 10/27/2022    Patient will be independent in home exercise program to improve strength/mobility for better functional independence with ADLs. Baseline: LSVT BIG protocol to be initiated next session; 12/18: not yet fully indep, reports she still gets confused about the technique; 12/03/2022: HEP to be updated; Pt reports practicing HEP 2x/week; 02/23/23: not currently performing HEP, reviewed in session and provided new copy  Goal status: IN PROGRESS   LONG TERM GOALS: Target date: 11/11/2022   Patient will increase FOTO score to equal to or greater than 49 to demonstrate statistically significant improvement in mobility and quality of life.  Baseline: 40; 12/18: deferred due to time; 45; 3/19: deferred to next visit; 01/21/2023; 02/23/2023: 46 (unchanged from previous assessment) Goal status: PARTIALLY MET  2.  Patient (> 12 years old) will complete five times sit to stand test in < 15 seconds indicating decreased fall risk.  Baseline: 27 sec; 12/18 23 sec hands-free; 11/04/22: 24 sec hands free; 2/1/124: 22.7 sec use of BUE, 21.3 sec hands-free; 01/19/23: 18 seconds hands-free; 02/23/23: 19 seconds hands-free Goal status: IN PROGRESS  3.  Patient will increase 10 meter walk test to >1.64m/s with WFL B step-length, upright posture for >  90% of test to improve ease with gait and to override hypokentic and bradykinetic movement.  Baseline: 0.58 m/s with 8JX; 12/18: 0.7 m/s with 9JY; 11/04/22: 0.69 m/s with 7WG;  12/03/22: 0.73 m/s with 9FA; 01/19/2023: 0.77 m/s with 2ZH; 02/23/2023: 0.83 m/s with 4WW Goal status: IN PROGRESS  4.  Patient will reduce time to don jacket by at least 5 seconds in order to increase ease with dressing and override  bradykinetic movement Baseline: 17 seconds; 12/18: 12 seconds Goal status: MET  5.  Pt will demo or report ability to open standard jar using only 1-2 attempts in order to exhibit improved functional mobility for ADLs.  Baseline: 5.7 seconds with multiple attempts to turn jar lid to open; 12/18: 2.8 sec opens in 2 turns Goal status: MET  6.  Patient will increase Berg Balance score by > 6 points to demonstrate decreased fall risk during functional activities. Baseline: to be tested next 1-2 visits; 12/03/2022: 44/56; 01/21/23 45/56; 48/56  Goal status: In Progress  7.  Patient will report at least 4 days in a row with minimal to no L hip pain in order to improve QOL and ease/safety with functional mobility. Baseline: Pt reports pain majority of days in L hip; 12/03/2022: pt reports having hip pain about every day; 3/19: rates hip pain daily (being seen now by MD, new imaging); 02/23/2023: pt went for period of time without hip pain (she thinks a week), but reports it has now come back, Goal status: In Progress  8. Patient will increase six minute walk test distance to >1000 for progression to community ambulator and improve gait ability Baseline:02/23/2023: 722 ft with 4WW and two standing rest breaks, rates test as hard Goal status: NEW  ASSESSMENT:  CLINICAL IMPRESSION: Pt progressed to using increased weights with ambulation. Pt able to complete two laps prior to rest break with first set, but then was limited by fatigue. Second part of session focused on static balance interventions, where pt required intermittent UE support throughout and brief rest breaks due to LE fatigue. The pt will benefit from further skilled PT to improve strength, balance, gait and mobility.  OBJECTIVE IMPAIRMENTS: Abnormal gait, decreased balance, decreased cognition, decreased coordination, decreased endurance, decreased knowledge of use of DME, decreased mobility, difficulty walking, decreased strength, dizziness,  hypomobility, impaired UE functional use, improper body mechanics, postural dysfunction, and pain.   ACTIVITY LIMITATIONS: lifting, bending, standing, squatting, stairs, transfers, bed mobility, bathing, dressing, hygiene/grooming, and locomotion level  PARTICIPATION LIMITATIONS: meal prep, cleaning, laundry, medication management, personal finances, driving, shopping, community activity, and yard work  PERSONAL FACTORS: Age, Fitness, Sex, Time since onset of injury/illness/exacerbation, and 3+ comorbidities: PMH includes hx of breast cancer, HTN, MCI, insomnia, cataracts, bipolar 1, alcohol use disorder, GAD, MDD, osteopenia of neck of R femur, chronic B low back pain with radiating pain to buttock and posterior thighs and calves. Pt with multiple comorbidities, please refer to chart for full details  are also affecting patient's functional outcome.   REHAB POTENTIAL: Fair    CLINICAL DECISION MAKING: Evolving/moderate complexity  EVALUATION COMPLEXITY: High  PLAN:  PT FREQUENCY: 2x/week  PT DURATION: 8 weeks  PLANNED INTERVENTIONS: Therapeutic exercises, Therapeutic activity, Neuromuscular re-education, Balance training, Gait training, Patient/Family education, Self Care, Joint mobilization, Stair training, Vestibular training, Canalith repositioning, Visual/preceptual remediation/compensation, Orthotic/Fit training, DME instructions, Wheelchair mobility training, Spinal mobilization, Cryotherapy, Moist heat, Splintting, Taping, and Manual therapy  PLAN FOR NEXT SESSION: hamtrings loading, hip strength, mobility, endurance, gait and balance  2:05 PM, 02/25/23 Temple Pacini PT, DPT  Physical Therapist - [facility]  [facility] [phone]     Baird Kay, [place] 02/25/2023, 2:05 PM

## 2023-02-25 NOTE — Progress Notes (Signed)
Care Management & Coordination Services Pharmacy Note  02/25/2023 Name:  Kendra [name] MRN:  [id number] DOB:  22-Apr-1946  Summary: Patient presents for initial pharmacy consult. Today's visit was 100% conducted with the patient's husband and caregiver, Molly Maduro.   -Since switching to losartan, patient's husband reports only mild improvement in her chronic cough. He reports she has less of a "racking cough," but still present and he is unsure how much it has actually improved.   Recommendations/Changes made from today's visit: -INCREASE Vitamin D to 4000 units daily  -Counseled to monitor BP at home weekly, document, and provide log at future appointments  Follow up plan: -HC Blood pressure assessment in 4 weeks.  CPP follow-up 6 months   Subjective: Kendra [name] is an 77 y.o. year old female who is a primary patient of Bacigalupo, Marzella Schlein, MD.  The care coordination team was consulted for assistance with disease management and care coordination needs.    Engaged with patient by telephone for initial visit.  Recent office visits: 02/09/2023 Shirlee Latch, MD (PCP Office Visit) for Medicare Wellness Exam- Started: Losartan Potassium-HCTZ 50-12.5 mg 1 tablet daily, Stopped: Lisinopril 20-12.5 mg, No orders placed, Patient to follow-up in 3 months   12/15/2022 Shirlee Latch, MD (PCP Office Visit) for-UTI- Started: Cephalexin 500 mg 3 times daily, Stopped: Famotidine 20 mg, Diclofenac, Lab orders placed,   11/06/2022 Shirlee Latch, MD (PCP Office Visit) for Hypertension- Stopped: Cefodizime, Meloxicam, Polyethylene, Tramadol, Started: Famotidine 20 mg twice daily, Lab orders placed, Patient to follow-up in 3 months   09/10/2022 Debera Lat, PA-C (PCP Office Visit) for Dysuria- No medication changes noted, Lab orders placed, No follow-up noted   08/25/2022 Ellwood Dense, DO (PCP Office Visit) for UTI- Started: Nitrofurantoin Monohyd Macro 100 mg twice daily, Lab order  placed, No follow-up noted  Recent consult visits: 01/26/2023 Delano Metz, MD (Pain Management) for Back Pain- No medication changes noted, No orders placed, BP: 137/95, No follow-up noted   01/04/2023 Delano Metz, MD (Pain Management) for Hip Pain- Stopped: Nystatin, Order placed for HIP XR Lumbar XR, BP: 166/67, No follow-up noted   01/04/2023 Jomarie Longs, MD (Behavioral Health) I did not break the glass so I do not know of the updates regarding this visit   11/09/2022 Janice Coffin, PA (Neurology) for Tremors- No medication changes noted, Lumbar order placed, patietn to follow-up in 6 months   10/06/2022 Temple Pacini, PT (Outpatient Rehab) for PT Treatment- No medication changes noted, No orders placed-Patient is doing PT and Speech Therapy several times a week   10/05/2022 Temple Pacini, PT (Outpatient Rehab) for PT Treatment- No medication changes noted, No orders placed   10/05/2022 Marquita Palms CCC-SLP (Outpatient Rehab) for SLP Tretment- No medication changes noted, No orders placed   09/29/2022 Marquita Palms CCC-SLP (Outpatient Rehab) for SLP Initial Evaluation- No medication changes noted, SLP Plan of care order placed   09/29/2022 Temple Pacini, PT (Outpatient Rehab) for Initial Evaluation- No medication changes noted, PT Plan of care order placed   09/28/2022 Rozanna Box (Behavioral Health)    09/04/2022 Jomarie Longs, MD (Behavioral Health)     08/28/2022 Louretta Shorten, MD (Oncology) for Malignant Neoplasm of upper-inner quadrant R breast- No medication changes noted, Lab orders placed, BP: 144/84, Patient to follow-up in 6 months  Hospital visits: None in previous 6 months   Objective:  Lab Results  Component Value Date   CREATININE 0.75 11/06/2022   BUN 9 11/06/2022   EGFR 83 11/06/2022   GFRNONAA >60  08/28/2022   GFRAA >60 04/29/2020   NA 138 11/06/2022   K 4.0 11/06/2022   CALCIUM 9.6 11/06/2022   CO2 24 11/06/2022   GLUCOSE 110 (H)  11/06/2022    Lab Results  Component Value Date/Time   HGBA1C 6.0 (H) 11/06/2022 10:47 AM   HGBA1C 5.9 (H) 12/17/2021 10:38 AM   HGBA1C 5.4 04/13/2019 12:00 AM    Last diabetic Eye exam: No results found for: "HMDIABEYEEXA"  Last diabetic Foot exam: No results found for: "HMDIABFOOTEX"   Lab Results  Component Value Date   CHOL 191 11/06/2022   HDL 58 11/06/2022   LDLCALC 113 (H) 11/06/2022   TRIG 112 11/06/2022   CHOLHDL 3.3 12/17/2021       Latest Ref Rng & Units 11/06/2022   10:47 AM 08/28/2022   10:18 AM 02/27/2022   10:07 AM  Hepatic Function  Total Protein 6.0 - 8.5 g/dL 6.3  6.6  6.9   Albumin 3.8 - 4.8 g/dL 4.4  3.7  4.1   AST 0 - 40 IU/L ALT 0 - 32 IU/L Alk Phosphatase 44 - 121 IU/L 37  32  28   Total Bilirubin 0.0 - 1.2 mg/dL 0.4  0.5  0.5     Lab Results  Component Value Date/Time   TSH 1.653 01/04/2023 01:40 PM   TSH 2.31 04/13/2019 12:00 AM   TSH 2.543 07/25/2018 09:19 AM       Latest Ref Rng & Units 08/28/2022   10:18 AM 02/27/2022   10:07 AM 09/01/2021   10:02 AM  CBC  WBC 4.0 - 10.5 K/uL 6.5  5.9  4.7   Hemoglobin 12.0 - 15.0 g/dL 16.1  09.6  04.5   Hematocrit 36.0 - 46.0 % 41.0  41.3  38.9   Platelets 150 - 400 K/uL 240  214  189     Lab Results  Component Value Date/Time   VD25OH 15.6 (L) 11/06/2022 10:47 AM   VD25OH 22.1 (L) 12/17/2021 10:38 AM   VITAMINB12 607 12/22/2021 04:00 PM   VITAMINB12 428 09/01/2021 10:02 AM    Clinical ASCVD: No  The 10-year ASCVD risk score (Arnett DK, et al., 2019) is: 20.7%   Values used to calculate the score:     Age: 89 years     Sex: Female     Is Non-Hispanic African American: No     Diabetic: No     Tobacco smoker: No     Systolic Blood Pressure: 127 mmHg     Is BP treated: Yes     HDL Cholesterol: 58 mg/dL     Total Cholesterol: 191 mg/dL       4/0/9811    9:14 PM 01/26/2023    2:13 PM 01/12/2023   10:57 AM  Depression screen PHQ 2/9  Decreased Interest 0 0 0   Down, Depressed, Hopeless 1 0 0  PHQ - 2 Score 1 0 0  Altered sleeping 2    Tired, decreased energy 2    Change in appetite 2    Feeling bad or failure about yourself  2    Trouble concentrating 1    Moving slowly or fidgety/restless 3    Suicidal thoughts 0    PHQ-9 Score 13    Difficult doing work/chores Extremely dIfficult       Social History   Tobacco Use  Smoking Status Never  Smokeless Tobacco Never  BP Readings from Last 3 Encounters:  02/09/23 127/76  01/26/23 (!) 137/95  01/12/23 (!) 179/80   Pulse Readings from Last 3 Encounters:  02/09/23 85  01/26/23 71  01/12/23 77   Wt Readings from Last 3 Encounters:  02/09/23 223 lb 12.8 oz (101.5 kg)  01/26/23 221 lb (100.2 kg)  01/12/23 226 lb (102.5 kg)   BMI Readings from Last 3 Encounters:  02/09/23 36.12 kg/m  01/26/23 35.67 kg/m  01/12/23 36.48 kg/m    Allergies  Allergen Reactions   Hydroxyzine Anaphylaxis    Tongue swollen   Other Other (See Comments)    Food poisoning    Shrimp [Shellfish Allergy] Other (See Comments)    Food poisoning     Medications Reviewed Today     Reviewed by Baird Kay, PT (Physical Therapist) on 02/23/23 at 1729  Med List Status: <None>   Medication Order Taking? Sig Documenting Provider Last Dose Status Informant  Acetaminophen 500 MG capsule [id number] No Take 1,000 mg by mouth 2 (two) times daily. [provider] Taking Active   Kipp Laurence MEDICATION [id number]  1 Bag by Other route daily. HERO smart dispenser Jomarie Longs, MD  Active   calcium-vitamin D (OSCAL WITH D) 500-5 MG-MCG tablet [id number]  Take 1 tablet by mouth 2 (two) times daily. [provider]  Active   carbidopa-levodopa (SINEMET IR) 25-100 MG tablet [id number] No Take 1 tablet by mouth daily in the afternoon. In the morning [provider] Taking Active   Carbidopa-Levodopa ER (SINEMET CR) 25-100 MG tablet controlled release [id number] No Take 1 tablet by  mouth at bedtime. [provider] Taking Active   cyanocobalamin (,VITAMIN B-12,) 1000 MCG/ML injection [id number] No INJECT INTO THE MUSCLE EVERY 30 DAYS Bacigalupo, Marzella Schlein, MD Taking Active   denosumab (PROLIA) 60 MG/ML SOSY injection [id number] No Inject 60 mg into the skin every 6 (six) months. takes annually [provider] Taking Active   FLUoxetine HCl 60 MG TABS [id number] No Take 1 tablet by mouth daily. Jomarie Longs, MD Taking Active   gabapentin (NEURONTIN) 100 MG capsule [id number] No Take 3 capsules (300 mg total) by mouth 3 (three) times daily.  Patient taking differently: Take 100 mg by mouth 3 (three) times daily.   Jacky Kindle, FNP Taking Active   letrozole [facility]) 2.5 MG tablet [id number] No Take 1 tablet (2.5 mg total) by mouth daily. Earna Coder, MD Taking Active   losartan-hydrochlorothiazide [facility]) 50-12.5 MG tablet [id number]  Take 1 tablet by mouth daily. Stop lisinopril-hctz - this replaced that medication Beryle Flock Marzella Schlein, MD  Active   risperiDONE (RISPERDAL) 0.5 MG tablet [id number] No Take 1 tablet (0.5 mg total) by mouth 2 (two) times daily. Jomarie Longs, MD Taking Active   traZODone (DESYREL) 100 MG tablet [id number] No Take 1 tablet (100 mg total) by mouth at bedtime. Jomarie Longs, MD Taking Active             SDOH:  (Social Determinants of Health) assessments and interventions performed: Yes SDOH Interventions    Flowsheet Row Office Visit from 11/06/2022 in [facility] Office Visit from 08/17/2022 in [facility] Office Visit from 12/23/2021 in [facility] Office Visit from 02/27/2021 in [facility] Clinical Support from 08/29/2020 in [facility] Counselor 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 confidential department  SDOH Interventions        Depression Interventions/Treatment  Currently  on Treatment PHQ2-9  Score <4 Follow-up Not Indicated PHQ2-9 Score <4 Follow-up Not Indicated Medication Currently on Treatment Counseling, Medication  Physical Activity Interventions -- -- -- -- Patient Refused, Other (Comments)  [Pt refuses due to Parkinsons.] --       Medication Assistance: None required.  Patient affirms current coverage meets needs.  Medication Access: Within the past 30 days, how often has patient missed a dose of medication? None Is a pillbox or other method used to improve adherence? Yes  Factors that may affect medication adherence? lack of understanding of disease management and dementia Are meds synced by current pharmacy? No  Are meds delivered by current pharmacy? No  Does patient experience delays in picking up medications due to transportation concerns? No   [facility] Reviewed: Is patient disadvantaged to use [facility]?: Yes  Current Rx insurance plan: [employer] Name and location of Current pharmacy:  [facility] [id number] [place], [place] - [address] [place] [address] Phone: [phone] Fax: [phone]  [facility] services reviewed with patient today?: Yes  Patient requests to transfer care to [facility]?: No  Reason patient declined to change pharmacies: Disadvantaged due to insurance/mail order and Prefers to go out and pick up medications themselves  Compliance/Adherence/Medication fill history: Care Gaps: Annual wellness visit in last year? Yes   [facility]: Losartan-HCTZ 50-12.5 mg last filled on 02/09/2023 for a 90-Day supply with [facility]   Assessment/Plan   Hypertension (BP goal <140/90) -Controlled -Current treatment: Losartan-HCTZ 50-12.5 mg daily  -Medications previously tried: Lisinopril (cough)   -Since switching to losartan, patient's husband reports only mild improvement in her chronic cough. He reports she has less of a "racking cough," but still present and he is  unsure how much it has actually improved.  -Current home readings: Not monitoring routinely at home.  -Reports hypotensive/hypertensive symptoms: dizziness related to ongoing balance concerns. She does not walk with a cane, walker, or other assistive device.  -Counseled to monitor BP at home weekly, document, and provide log at future appointments -[facility] Blood pressure assessment in 4 weeks. If cough still present will discuss possible referral to pulmonology.  -Recommended to continue current medication  Osteopenia (Goal Prevent fractures) -Controlled -Last DEXA Scan: 07/24/22   T-Score femoral neck: -1.6  T-Score total hip: -0.4  T-Score forearm radius: -0.2 -Current treatment  Prolia 60 mg every 6 months  Vitamin D3 2000 units daily  -Medications previously tried: NA   -Recommend [id number] units of vitamin D daily. Recommend 1200 mg of calcium daily from dietary and supplemental sources. -INCREASE Vitamin D to 4000 units daily   Follow Up Plan: Telephone follow up appointment with care management team member scheduled for:  09/02/2023 at 11:00 AM  Angelena Sole, PharmD, Patsy Baltimore, CPP  Clinical Pharmacist Practitioner  [facility] [phone]

## 2023-03-01 ENCOUNTER — Inpatient Hospital Stay: Payer: Medicare PPO

## 2023-03-01 ENCOUNTER — Inpatient Hospital Stay (HOSPITAL_BASED_OUTPATIENT_CLINIC_OR_DEPARTMENT_OTHER): Payer: Medicare PPO | Admitting: Internal Medicine

## 2023-03-01 ENCOUNTER — Encounter: Payer: Self-pay | Admitting: Internal Medicine

## 2023-03-01 ENCOUNTER — Inpatient Hospital Stay: Payer: Medicare PPO | Attending: Internal Medicine

## 2023-03-01 VITALS — BP 121/71 | HR 80 | Temp 98.9°F | Ht 66.0 in | Wt 223.2 lb

## 2023-03-01 DIAGNOSIS — Z79811 Long term (current) use of aromatase inhibitors: Secondary | ICD-10-CM | POA: Insufficient documentation

## 2023-03-01 DIAGNOSIS — Z90721 Acquired absence of ovaries, unilateral: Secondary | ICD-10-CM | POA: Insufficient documentation

## 2023-03-01 DIAGNOSIS — I10 Essential (primary) hypertension: Secondary | ICD-10-CM | POA: Insufficient documentation

## 2023-03-01 DIAGNOSIS — Z8249 Family history of ischemic heart disease and other diseases of the circulatory system: Secondary | ICD-10-CM | POA: Insufficient documentation

## 2023-03-01 DIAGNOSIS — Z79899 Other long term (current) drug therapy: Secondary | ICD-10-CM | POA: Insufficient documentation

## 2023-03-01 DIAGNOSIS — Z801 Family history of malignant neoplasm of trachea, bronchus and lung: Secondary | ICD-10-CM | POA: Insufficient documentation

## 2023-03-01 DIAGNOSIS — C50211 Malignant neoplasm of upper-inner quadrant of right female breast: Secondary | ICD-10-CM

## 2023-03-01 DIAGNOSIS — Z923 Personal history of irradiation: Secondary | ICD-10-CM | POA: Insufficient documentation

## 2023-03-01 DIAGNOSIS — G20A1 Parkinson's disease without dyskinesia, without mention of fluctuations: Secondary | ICD-10-CM | POA: Diagnosis not present

## 2023-03-01 DIAGNOSIS — M2550 Pain in unspecified joint: Secondary | ICD-10-CM | POA: Insufficient documentation

## 2023-03-01 DIAGNOSIS — Z833 Family history of diabetes mellitus: Secondary | ICD-10-CM | POA: Diagnosis not present

## 2023-03-01 DIAGNOSIS — M549 Dorsalgia, unspecified: Secondary | ICD-10-CM | POA: Diagnosis not present

## 2023-03-01 DIAGNOSIS — G8929 Other chronic pain: Secondary | ICD-10-CM | POA: Diagnosis not present

## 2023-03-01 DIAGNOSIS — Z170 Estrogen receptor positive status [ER+]: Secondary | ICD-10-CM | POA: Insufficient documentation

## 2023-03-01 DIAGNOSIS — R5383 Other fatigue: Secondary | ICD-10-CM | POA: Insufficient documentation

## 2023-03-01 DIAGNOSIS — Z803 Family history of malignant neoplasm of breast: Secondary | ICD-10-CM | POA: Diagnosis not present

## 2023-03-01 DIAGNOSIS — M85851 Other specified disorders of bone density and structure, right thigh: Secondary | ICD-10-CM

## 2023-03-01 LAB — COMPREHENSIVE METABOLIC PANEL
ALT: 12 U/L (ref 0–44)
AST: 30 U/L (ref 15–41)
Albumin: 3.8 g/dL (ref 3.5–5.0)
Alkaline Phosphatase: 33 U/L — ABNORMAL LOW (ref 38–126)
Anion gap: 8 (ref 5–15)
BUN: 10 mg/dL (ref 8–23)
CO2: 25 mmol/L (ref 22–32)
Calcium: 8.9 mg/dL (ref 8.9–10.3)
Chloride: 98 mmol/L (ref 98–111)
Creatinine, Ser: 0.85 mg/dL (ref 0.44–1.00)
GFR, Estimated: >60 mL/min (ref >60)
Glucose, Bld: 157 mg/dL — ABNORMAL HIGH (ref 70–99)
Potassium: 3.6 mmol/L (ref 3.5–5.1)
Sodium: 131 mmol/L — ABNORMAL LOW (ref 135–145)
Total Bilirubin: 0.6 mg/dL (ref 0.3–1.2)
Total Protein: 6.6 g/dL (ref 6.5–8.1)

## 2023-03-01 LAB — CBC WITH DIFFERENTIAL/PLATELET
Abs Immature Granulocytes: 0.04 10*3/uL (ref 0.00–0.07)
Basophils Absolute: 0.1 10*3/uL (ref 0.0–0.1)
Basophils Relative: 1 %
Eosinophils Absolute: 0.3 10*3/uL (ref 0.0–0.5)
Eosinophils Relative: 6 %
HCT: 40.2 % (ref 36.0–46.0)
Hemoglobin: 13.1 g/dL (ref 12.0–15.0)
Immature Granulocytes: 1 %
Lymphocytes Relative: 19 %
Lymphs Abs: 1 10*3/uL (ref 0.7–4.0)
MCH: 28.5 pg (ref 26.0–34.0)
MCHC: 32.6 g/dL (ref 30.0–36.0)
MCV: 87.6 fL (ref 80.0–100.0)
Monocytes Absolute: 0.5 10*3/uL (ref 0.1–1.0)
Monocytes Relative: 10 %
Neutro Abs: 3.4 10*3/uL (ref 1.7–7.7)
Neutrophils Relative %: 63 %
Platelets: 212 10*3/uL (ref 150–400)
RBC: 4.59 MIL/uL (ref 3.87–5.11)
RDW: 14.5 % (ref 11.5–15.5)
WBC: 5.3 10*3/uL (ref 4.0–10.5)
nRBC: 0 % (ref 0.0–0.2)

## 2023-03-01 MED ORDER — DENOSUMAB 60 MG/ML ~~LOC~~ SOSY
60.0000 mg | PREFILLED_SYRINGE | Freq: Once | SUBCUTANEOUS | Status: AC
  Administered 2023-03-01: 60 mg via SUBCUTANEOUS
  Filled 2023-03-01: qty 1

## 2023-03-01 NOTE — Progress Notes (Signed)
[facility] CONSULT NOTE  Patient Care Team: Beryle Flock Marzella Schlein, MD as PCP - General (Family Medicine) Carmina Miller, MD as Referring Physician (Radiation Oncology) Lockie Mola, MD as Referring Physician (Ophthalmology) Myrene Galas, PT (Inactive) as Physical Therapist (Physical Therapy) Earna Coder, MD as Consulting Physician (Hematology and Oncology) Delano Metz, MD as Consulting Physician (Pain Medicine)  CHIEF COMPLAINTS/PURPOSE OF CONSULTATION: Breast cancer.  Oncology History Overview Note  # multi-focal stage IA right breast cancer s/p right lumpectomy on 04/27/2018.  Pathology of the right lateral lesion revealed a 2.5 cm grade II invasive ductal carcinoma with intermediate grade DCIS.  The medial lesion revealed a 1.1 cm grade II invasive ductal carcinoma with intermediate grade DCIS. LVIDS was present.  Margins were negative.  Zero of 2 lymph nodes were positive. Both tumors were ER + (100%), PR + (70%), and HER-2 negative.  Ki-67 was 10%.  Pathologic stage was mT2N0. Oncotype DX revealed a recurrence score of 12 which translated to a distant recurrence of 3% at 9 years with an AI or tamxifen alone.  The absolute benefit of chemotherpay was < 1%. S/p breast radiation from 06/30/2018 - 07/20/2018.    She began Femara on 09/06/2018.     # multilocular mucinous cystadenoma: s/p exploratory laparotomy, bilateral salpingo-oophrectomy, and pelvic washings on 02/10/2018. 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  Peritoneal washings revealed atypical cells.     Malignant neoplasm of upper-inner quadrant of right breast in female, estrogen receptor positive (HCC)  03/14/2018 Initial Diagnosis   Right breast mass noted on a CT scan, on mammogram 2 separate masses were noted.  2.2 cm mass at 11 o'clock position: IDC grade 2 with DCIS, ER 95%, PR 70%, Ki-67 10%,HER-2 negative ratio 1.3; second mass 1.3 cm at 1 o'clock  position: IDC grade 2, ER 100%, PR 70%, Ki-67 10%, HER-2 negative ratio 1.6, T2N0 stage Ib clinical stage AJCC 8   04/27/2018 Surgery   Right lumpectomy lateral: IDC grade 2, 2.5 cm, intermediate grade DCIS; right lumpectomy medial: IDC grade 2 1.1 cm, LVIDS present, intermediate grade DCIS, margins negative, 0/2 lymph nodes negative both tumors are ER PR positive HER-2 negative with a Ki-67 of 10%, T2N0 stage   05/06/2018 Cancer Staging   Staging form: Breast, AJCC 8th Edition - Pathologic: Stage IA (pT2(2), pN0, cM0, G2, ER+, PR+, HER2-) - Signed by Serena Croissant, MD on 05/06/2018     HISTORY OF PRESENTING ILLNESS: Patient ambulating in wheel chair.   Accompanied by family.   Kendra [name] 77 y.o.  female patient with stage I breast cancer ER/PR positive HER2 negative, Parkinson's disease chronic back pain-currently on Femara is here for follow-up.  Patient complains of ongoing bladder leakage/urgency x 6 month..  Denies any hot flashes.  Denies any falls.  Appetite is good.  No weight loss no nausea vomiting.  She is compliant with her Femara.  Review of Systems  Constitutional:  Positive for malaise/fatigue. Negative for chills, diaphoresis, fever and weight loss.  HENT:  Negative for nosebleeds and sore throat.   Eyes:  Negative for double vision.  Respiratory:  Negative for cough, hemoptysis, sputum production, shortness of breath and wheezing.   Cardiovascular:  Negative for chest pain, palpitations, orthopnea and leg swelling.  Gastrointestinal:  Negative for abdominal pain, blood in stool, constipation, diarrhea, heartburn, melena, nausea and vomiting.  Genitourinary:  Negative for dysuria, frequency and urgency.  Musculoskeletal:  Positive for back pain and joint pain.  Skin: Negative.  Negative for itching and rash.  Neurological:  Negative for dizziness, tingling, focal weakness, weakness and headaches.  Endo/Heme/Allergies:  Does not bruise/bleed easily.   Psychiatric/Behavioral:  Negative for depression. The patient is not nervous/anxious and does not have insomnia.      MEDICAL HISTORY:  Past Medical History:  Diagnosis Date   Anxiety    Breast cancer (HCC) 03/2018   right breast cancer at 11:00 and 1:00   Bursitis    bilateral hips and knees   Complication of anesthesia    Depression    Family history of breast cancer 03/24/2018   Family history of lung cancer 03/24/2018   History of alcohol dependence (HCC) 2007   no ETOH; resolved since 2007   History of hiatal hernia    History of psychosis 2014   due to sleep disturbance   Hypertension    Parkinson disease    Personal history of radiation therapy 06/2018-07/2018   right breast ca   PONV (postoperative nausea and vomiting)     SURGICAL HISTORY: Past Surgical History:  Procedure Laterality Date   BREAST BIOPSY Right 03/14/2018   11:00 DCIS and invasive ductal carcinoma   BREAST BIOPSY Right 03/14/2018   1:00 Invasive ductal carcinoma   BREAST LUMPECTOMY Right 04/27/2018   lumpectomy of 11 and 1:00 cancers, clear margins, negative LN   BREAST LUMPECTOMY WITH RADIOACTIVE SEED AND SENTINEL LYMPH NODE BIOPSY Right 04/27/2018   Procedure: RIGHT BREAST LUMPECTOMY WITH RADIOACTIVE SEED X 2 AND RIGHT SENTINEL LYMPH NODE BIOPSY;  Surgeon: Glenna Fellows, MD;  Location: [facility];  Service: General;  Laterality: Right;   CATARACT EXTRACTION W/PHACO Left 08/30/2019   Procedure: CATARACT EXTRACTION PHACO AND INTRAOCULAR LENS PLACEMENT (IOC) LEFT panoptix toric  01:20.5  12.7%  10.26;  Surgeon: Lockie Mola, MD;  Location: [facility];  Service: Ophthalmology;  Laterality: Left;   CATARACT EXTRACTION W/PHACO Right 09/20/2019   Procedure: CATARACT EXTRACTION PHACO AND INTRAOCULAR LENS PLACEMENT (IOC) RIGHT 5.71  00:36.4  15.7%;  Surgeon: Lockie Mola, MD;  Location: [facility];  Service: Ophthalmology;  Laterality: Right;   LAPAROTOMY  N/A 02/10/2018   Procedure: EXPLORATORY LAPAROTOMY;  Surgeon: Shonna Chock, MD;  Location: [facility];  Service: Gynecology;  Laterality: N/A;   MASS EXCISION  01/2018   abdominal   OVARIAN CYST REMOVAL     SALPINGOOPHORECTOMY Bilateral 02/10/2018   Procedure: BILATERAL SALPINGO OOPHORECTOMY; PERITONEAL WASHINGS;  Surgeon: Shonna Chock, MD;  Location: [facility];  Service: Gynecology;  Laterality: Bilateral;   TONSILLECTOMY     TONSILLECTOMY AND ADENOIDECTOMY  1953    SOCIAL HISTORY: Social History   Socioeconomic History   Marital status: Married    Spouse name: robert   Number of children: 0   Years of education: Not on file   Highest education level: Some college, no degree  Occupational History   Occupation: retired Insurance underwriter of education  Tobacco Use   Smoking status: Never   Smokeless tobacco: Never  Vaping Use   Vaping Use: Never used  Substance and Sexual Activity   Alcohol use: Not Currently    Comment: alcohol dependence prior to 2007   Drug use: Never   Sexual activity: Yes    Partners: Male    Birth control/protection: Surgical  Other Topics Concern   Not on file  Social History Narrative   Not on file   Social Determinants of Health   Financial Resource Strain: Low Risk  (02/25/2023)  Overall Financial Resource Strain (CARDIA)    Difficulty of Paying Living Expenses: Not hard at all  Food Insecurity: No Food Insecurity (08/29/2020)   Hunger Vital Sign    Worried About Running Out of Food in the Last Year: Never true    Ran Out of Food in the Last Year: Never true  Transportation Needs: No Transportation Needs (02/25/2023)   PRAPARE - Administrator, Civil Service (Medical): No    Lack of Transportation (Non-Medical): No  Physical Activity: Inactive (08/29/2020)   Exercise Vital Sign    Days of Exercise per Week: 0 days    Minutes of Exercise per Session: 0 min  Stress: Stress Concern Present (08/29/2020)   [employer] of Occupational Health - Occupational Stress Questionnaire    Feeling of Stress : To some extent  Social Connections: Moderately Integrated (08/29/2020)   Social Connection and Isolation Panel [NHANES]    Frequency of Communication with Friends and Family: More than three times a week    Frequency of Social Gatherings with Friends and Family: More than three times a week    Attends Religious Services: Never    Database administrator or Organizations: Yes    Attends Banker Meetings: 1 to 4 times per year    Marital Status: Married  Catering manager Violence: Not At Risk (08/29/2020)   Humiliation, Afraid, Rape, and Kick questionnaire    Fear of Current or Ex-Partner: No    Emotionally Abused: No    Physically Abused: No    Sexually Abused: No    FAMILY HISTORY: Family History  Problem Relation Age of Onset   Diabetes Mother    Breast cancer Mother 59   Hypertension Mother    Lung cancer Father        asbestos exposure   Heart disease Brother 36   Breast cancer Cousin 3       paternal cousin   Heart disease Paternal Grandmother 4   Heart disease Paternal Grandfather     ALLERGIES:  is allergic to hydroxyzine, other, and shrimp [shellfish allergy].  MEDICATIONS:  Current Outpatient Medications  Medication Sig Dispense Refill   Acetaminophen 500 MG capsule Take 1,000 mg by mouth 2 (two) times daily.     AMBULATORY NON FORMULARY MEDICATION 1 Bag by Other route daily. HERO smart dispenser 1 Bag 0   carbidopa-levodopa (SINEMET IR) 25-100 MG tablet Take 2 tablets by mouth with breakfast, with lunch, and with evening meal.     Carbidopa-Levodopa ER (SINEMET CR) 25-100 MG tablet controlled release Take 2 tablets by mouth at bedtime.     cyanocobalamin (,VITAMIN B-12,) 1000 MCG/ML injection INJECT INTO THE MUSCLE EVERY 30 DAYS 3 mL 3   denosumab (PROLIA) 60 MG/ML SOSY injection Inject 60 mg into the skin every 6 (six) months. takes annually      FLUoxetine HCl 60 MG TABS Take 1 tablet by mouth daily. 90 tablet 1   gabapentin (NEURONTIN) 100 MG capsule Take 200 mg by mouth 3 (three) times daily.     letrozole (FEMARA) 2.5 MG tablet Take 1 tablet (2.5 mg total) by mouth daily. 90 tablet 1   losartan-hydrochlorothiazide (HYZAAR) 50-12.5 MG tablet Take 1 tablet by mouth daily. Stop lisinopril-hctz - this replaced that medication 90 tablet 1   risperiDONE (RISPERDAL) 0.5 MG tablet Take 1 tablet (0.5 mg total) by mouth 2 (two) times daily. 180 tablet 1   traZODone (DESYREL) 100 MG tablet Take 1 tablet (  100 mg total) by mouth at bedtime. 90 tablet 1   No current facility-administered medications for this visit.      Marland Kitchen  PHYSICAL EXAMINATION:  Vitals:   03/01/23 1426  BP: 121/71  Pulse: 80  Temp: 98.9 F (37.2 C)  SpO2: 95%   Filed Weights   03/01/23 1426  Weight: 223 lb 3.2 oz (101.2 kg)    Physical Exam Vitals and nursing note reviewed.  HENT:     Head: Normocephalic and atraumatic.     Mouth/Throat:     Pharynx: Oropharynx is clear.  Eyes:     Extraocular Movements: Extraocular movements intact.     Pupils: Pupils are equal, round, and reactive to light.  Cardiovascular:     Rate and Rhythm: Normal rate and regular rhythm.  Pulmonary:     Comments: Decreased breath sounds bilaterally.  Abdominal:     Palpations: Abdomen is soft.  Musculoskeletal:        General: Normal range of motion.     Cervical back: Normal range of motion.  Skin:    General: Skin is warm.  Neurological:     General: No focal deficit present.     Mental Status: She is alert and oriented to person, place, and time.  Psychiatric:        Behavior: Behavior normal.        Judgment: Judgment normal.      LABORATORY DATA:  I have reviewed the data as listed Lab Results  Component Value Date   WBC 5.3 03/01/2023   HGB 13.1 03/01/2023   HCT 40.2 03/01/2023   MCV 87.6 03/01/2023   PLT 212 03/01/2023   Recent Labs    08/28/22 1018  11/06/22 1047 03/01/23 1429  NA 137 138 131*  K 3.5 4.0 3.6  CL 101 98 98  CO2 26 24 25   GLUCOSE 162* 110* 157*  BUN 8 9 10   CREATININE 0.83 0.75 0.85  CALCIUM 9.0 9.6 8.9  GFRNONAA >60  --  >60  PROT 6.6 6.3 6.6  ALBUMIN 3.7 4.4 3.8  AST 24 17 30   ALT 10 26 12   ALKPHOS 32* 37* 33*  BILITOT 0.5 0.4 0.6    RADIOGRAPHIC STUDIES: I have personally reviewed the radiological images as listed and agreed with the findings in the report. No results found.  Malignant neoplasm of upper-inner quadrant of right breast in female, estrogen receptor positive (HCC) # multi-focal stage IA right breast cancer s/p right lumpectomy on 04/27/2018.  ER + (100%), PR + (70%), and HER-2 negative.  Ki-67 was 10%.  Pathologic stage was mT2N0. Oncotype DX revealed a recurrence score of 12 which translated to a distant recurrence of 3% at 9 years with an AI or tamxifen alone.  No chemotherapy given.  S/p breast radiation from 06/30/2018 - 07/20/2018.  She began Femara on 09/06/2018.  She is tolerating it well. AUG 2023- Mammo BIL- WNL. STABLE.  Consider discontinuation of Femara in December 2024.   # continue Femara [Nov 2024; 5 years]; tolerating well with no bleeding side effects noted.  Stable.    #B12 deficiency [hx of perncious abemia]- Patient's cousin gives her B12 injections monthly.             # Osteopenia- AUG 2023- T-score of -1.6- Continue Prolia SQ- Overall stable;    #  PArkinson- /trermors/ gait sintabiliy- sinemet - stable.    # bladder leakage/urgency-question atrophic vaginitis secondary to endocrine therapy.  S/p evaluation with PCP; possible urology  evaluation awaiting reevaluation with PCP in 3 months.  # Elevated BG- PBF- 157-discussed regarding dietary discretion- stable.   # DISPOSITION: # prolia today # Mammogram BIL in AUG 2024 #  RTC in 6 months for MD assessment, labs (CBC with diff, CMP, CA 27-29, CA-125) and +/- Prolia.-Dr.B    All questions were answered. The patient  knows to call the clinic with any problems, questions or concerns.     Earna Coder, MD 03/01/2023 3:52 PM

## 2023-03-01 NOTE — Assessment & Plan Note (Addendum)
#   multi-focal stage IA right breast cancer s/p right lumpectomy on 04/27/2018.  ER + (100%), PR + (70%), and HER-2 negative.  Ki-67 was 10%.  Pathologic stage was mT2N0. Oncotype DX revealed a recurrence score of 12 which translated to a distant recurrence of 3% at 9 years with an AI or tamxifen alone.  No chemotherapy given.  S/p breast radiation from 06/30/2018 - 07/20/2018.  She began Femara on 09/06/2018.  She is tolerating it well. AUG 2023- Mammo BIL- WNL. STABLE.  Consider discontinuation of Femara in December 2024.   # continue Femara [Nov 2024; 5 years]; tolerating well with no bleeding side effects noted.  Stable.    #B12 deficiency [hx of perncious abemia]- Patient's cousin gives her B12 injections monthly.             # Osteopenia- AUG 2023- T-score of -1.6- Continue Prolia SQ- Overall stable;    #  PArkinson- /trermors/ gait sintabiliy- sinemet - stable.    # bladder leakage/urgency-question atrophic vaginitis secondary to endocrine therapy.  S/p evaluation with PCP; possible urology evaluation awaiting reevaluation with PCP in 3 months.  # Elevated BG- PBF- 157-discussed regarding dietary discretion- stable.   # DISPOSITION: # prolia today # Mammogram BIL in AUG 2024 #  RTC in 6 months for MD assessment, labs (CBC with diff, CMP, CA 27-29, CA-125) and +/- Prolia.-Dr.B

## 2023-03-01 NOTE — Progress Notes (Signed)
C/o bladder leakage/urgency x 6months.

## 2023-03-02 ENCOUNTER — Ambulatory Visit: Payer: Medicare PPO

## 2023-03-02 DIAGNOSIS — R2681 Unsteadiness on feet: Secondary | ICD-10-CM | POA: Diagnosis not present

## 2023-03-02 DIAGNOSIS — R262 Difficulty in walking, not elsewhere classified: Secondary | ICD-10-CM

## 2023-03-02 DIAGNOSIS — M6281 Muscle weakness (generalized): Secondary | ICD-10-CM | POA: Diagnosis not present

## 2023-03-02 DIAGNOSIS — R278 Other lack of coordination: Secondary | ICD-10-CM | POA: Diagnosis not present

## 2023-03-02 DIAGNOSIS — M25552 Pain in left hip: Secondary | ICD-10-CM | POA: Diagnosis not present

## 2023-03-02 DIAGNOSIS — R42 Dizziness and giddiness: Secondary | ICD-10-CM | POA: Diagnosis not present

## 2023-03-02 LAB — CA 125: Cancer Antigen (CA) 125: 9.3 U/mL (ref 0.0–38.1)

## 2023-03-02 LAB — CANCER ANTIGEN 27.29: CA 27.29: 30.2 U/mL (ref 0.0–38.6)

## 2023-03-02 NOTE — Therapy (Signed)
OUTPATIENT NEURO PHYSICAL THERAPY TREATMENT    Patient Name: Kendra [name] MRN: [id number] DOB:1946/09/18, 77 y.o., female Today's Date: 03/02/2023  PCP: Erasmo Downer, MD REFERRING PROVIDER: Erasmo Downer, MD  END OF SESSION:  PT End of Session - 03/02/23 1350     Visit Number 32    Number of Visits 39    Date for PT Re-Evaluation 03/16/23    Authorization Type [employer] PPO    Authorization Time Period 11/04/2022-12/30/2022    Progress Note Due on Visit 30    PT Start Time 1302    PT Stop Time 1345    PT Time Calculation (min) 43 min    Equipment Utilized During Treatment Gait belt    Activity Tolerance Patient tolerated treatment well;Patient limited by fatigue    Behavior During Therapy WFL for tasks assessed/performed                       Past Medical History:  Diagnosis Date   Anxiety    Breast cancer (HCC) 03/2018   right breast cancer at 11:00 and 1:00   Bursitis    bilateral hips and knees   Complication of anesthesia    Depression    Family history of breast cancer 03/24/2018   Family history of lung cancer 03/24/2018   History of alcohol dependence (HCC) 2007   no ETOH; resolved since 2007   History of hiatal hernia    History of psychosis 2014   due to sleep disturbance   Hypertension    Parkinson disease    Personal history of radiation therapy 06/2018-07/2018   right breast ca   PONV (postoperative nausea and vomiting)    Past Surgical History:  Procedure Laterality Date   BREAST BIOPSY Right 03/14/2018   11:00 DCIS and invasive ductal carcinoma   BREAST BIOPSY Right 03/14/2018   1:00 Invasive ductal carcinoma   BREAST LUMPECTOMY Right 04/27/2018   lumpectomy of 11 and 1:00 cancers, clear margins, negative LN   BREAST LUMPECTOMY WITH RADIOACTIVE SEED AND SENTINEL LYMPH NODE BIOPSY Right 04/27/2018   Procedure: RIGHT BREAST LUMPECTOMY WITH RADIOACTIVE SEED X 2 AND RIGHT SENTINEL LYMPH NODE BIOPSY;  Surgeon:  Glenna Fellows, MD;  Location: [facility];  Service: General;  Laterality: Right;   CATARACT EXTRACTION W/PHACO Left 08/30/2019   Procedure: CATARACT EXTRACTION PHACO AND INTRAOCULAR LENS PLACEMENT (IOC) LEFT panoptix toric  01:20.5  12.7%  10.26;  Surgeon: Lockie Mola, MD;  Location: [facility];  Service: Ophthalmology;  Laterality: Left;   CATARACT EXTRACTION W/PHACO Right 09/20/2019   Procedure: CATARACT EXTRACTION PHACO AND INTRAOCULAR LENS PLACEMENT (IOC) RIGHT 5.71  00:36.4  15.7%;  Surgeon: Lockie Mola, MD;  Location: [facility];  Service: Ophthalmology;  Laterality: Right;   LAPAROTOMY N/A 02/10/2018   Procedure: EXPLORATORY LAPAROTOMY;  Surgeon: Shonna Chock, MD;  Location: [facility];  Service: Gynecology;  Laterality: N/A;   MASS EXCISION  01/2018   abdominal   OVARIAN CYST REMOVAL     SALPINGOOPHORECTOMY Bilateral 02/10/2018   Procedure: BILATERAL SALPINGO OOPHORECTOMY; PERITONEAL WASHINGS;  Surgeon: Shonna Chock, MD;  Location: [facility];  Service: Gynecology;  Laterality: Bilateral;   TONSILLECTOMY     TONSILLECTOMY AND ADENOIDECTOMY  1953   Patient Active Problem List   Diagnosis Date Noted   Prediabetes 12/15/2022   Chronic cough 08/17/2022   History of allergy to shellfish 02/24/2022   Chronic low back pain of over 3 months duration  02/04/2022   Osteoarthritis of  AC (acromioclavicular) joint (Left) 02/04/2022   Osteoarthritis of hip (Right) 02/04/2022   Impaction fracture of distal end of radius and ulna, sequela (Left) 02/04/2022   Osteoarthritis of 1st (thumb) carpometacarpal (CMC) joint of hand (Left) 02/04/2022   Lumbosacral facet arthropathy (Multilevel) (Bilateral) (T12-S1) 02/04/2022   Lumbar central spinal stenosis w/o neurogenic claudication (L2-3, L3-4, L4-5) 02/04/2022   Lumbar foraminal stenosis (Bilateral: L2-3, L4-5) (Right: (Severe) L4-5) 02/04/2022   Lumbar lateral recess stenosis (Bilateral: L2-3,  L3-4, and L4-5) (Severe) 02/04/2022   DDD (degenerative disc disease), lumbar 02/04/2022   Lumbosacral facet syndrome (Multilevel) (Bilateral) 02/04/2022   Osteoarthritis of lumbar spine 02/04/2022   Allergic conjunctivitis of both eyes 12/23/2021   Chronic hip pain (3ry area of Pain) (Bilateral) (L>R) 12/22/2021   Chronic lower extremity pain (2ry area of Pain) (Bilateral) (L>R) 12/22/2021   Abnormal MRI, lumbar spine (01/31/2020) 12/22/2021   Grade 1 Anterolisthesis of lumbar spine (L3/L4) & (5 mm) (L4/L5) 12/22/2021   Chronic pain syndrome 11/17/2021   High risk medication use 11/17/2021   Disorder of skeletal system 11/17/2021   Problems influencing health status 11/17/2021   Other hyperlipidemia 10/21/2021   Polypharmacy 08/12/2021   Chronic low back pain (1ry area of Pain) (Bilateral) (L>R) w/o sciatica 06/18/2021   Osteopenia of neck of femur (Right) 09/06/2020   Obesity 08/29/2020   Parkinson disease 02/22/2020   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   Bipolar disorder, in full remission, most recent episode depressed (HCC) 11/08/2019   Vitamin D insufficiency 08/24/2019   Insomnia due to mental condition 05/31/2019   Bipolar I disorder, most recent episode depressed (HCC) 05/31/2019   Alcohol use disorder, moderate, in sustained remission (HCC) 05/31/2019   Elevated tumor markers 05/27/2019   GAD (generalized anxiety disorder) 04/10/2019   MDD (major depressive disorder), severe (HCC) 01/31/2019   High serum carbohydrate antigen 19-9 (CA19-9) 12/01/2018   Adjustment disorder with anxious mood 11/24/2018   Essential hypertension 11/24/2018   B12 deficiency 10/17/2018   Incisional hernia, without obstruction or gangrene 09/12/2018   Genetic testing 04/07/2018   Family history of breast cancer 03/24/2018   Family history of lung cancer 03/24/2018   Malignant neoplasm of upper-outer quadrant of right breast in female, estrogen receptor positive (HCC) 03/17/2018   Malignant  neoplasm of upper-inner quadrant of right breast in female, estrogen receptor positive (HCC) 03/17/2018   History of psychosis 02/07/2018   History of insomnia 02/07/2018    ONSET DATE: Pt diagnosed with PD in 2019  REFERRING DIAG:  G20 (ICD-10-CM) - Parkinson's disease      THERAPY DIAG:  Difficulty in walking, not elsewhere classified  Muscle weakness (generalized)  Unsteadiness on feet  Other lack of coordination  Rationale for Evaluation and Treatment: Rehabilitation  SUBJECTIVE: Pt reports no L hip pain at the moment. Pt spouse reports they had a busy weekend and have not been able to check pt's BP yet.                                                                             Pain: no pain currently Pt accompanied by: significant other self  PERTINENT HISTORY:  Pt is a pleasant 77 yo female  who was diagnosed with PD in 2019. She is known to clinic and [facility], and was last seen for LSVT BIG Sept-Oct 2022. Pt with chronic pain and has become increasingly sedentary as a result exacerbating mobility difficulties due to PD. Current concerns: Pt reports L and R hip pain and back pain. Pt was being seen by pain management clinic. Pt has had injections that provided relief at the time. Pt's spouse reports her physician determined surgery was not an option for her pain so the plan is to manage it. Pt spouse thinks pain has resulted in reluctance for pt to move. Pt currently sedentary, doesn't walk much, but when she does she holds on to furniture for support due to balance problems. When pt first stands she must hold on to something due to mix of pain and/or dizziness (described as lightheadedness). Her pain can go from 3/10 to 9/10. It does improve once she starts moving. Pt has increased difficulty with multiple activities including: getting in and out of her bed, getting in and out of her car, freezing, decreased gait speed, decreased balance and cognition, and difficulty using BUE due  to tremors.  Per chart other PMH includes hx of breast cancer, HTN, MCI, insomnia, cataracts, bipolar 1, alcohol use disorder, GAD, MDD, osteopenia of neck of R femur, chronic B low back pain with radiating pain to buttock and posterior thighs and calves. Pt with multiple comorbidities, please refer to chart for full details    PAIN: from eval Are you having pain? Yes: NPRS scale: not rated currently but can reach 9/10 Pain location: L hip and L leg Pain description: pt has difficulty describing Aggravating factors: unsure Relieving factors: laying in the recliner helps, movement  PRECAUTIONS: Fall  WEIGHT BEARING RESTRICTIONS: No  FALLS: Has patient fallen in last 6 months? Yes. Number of falls 1x, leg tangled in chair leg when getting up from a chair  PLOF: Independent with basic ADLs  PATIENT GOALS: improve mobility   BLE Strength Assessment      Right  Left  Hip Flexion 5/5 4/5 (painful)  Hip Horizontal ABDCT  5/5 5/5  Hip Horizontal ADD 4+/5 4+/5  Hip IR (seated)  4+/5 4+/5  Hip ER (seated) 5/5 5/5  Knee Extension 5/5 5/5  Knee Flexion (seated) 4+/5  5/5 (not painful)  Hip ABDCT (supine)  3+/5 4-/5   Focal popliteal edema Left knee, not painful   ROM Assessment Hip     Right  Left   Flexion Supine 132 120 (ouch)  External rotation (90/90) 30 47  Internal Rotation (90/90) 40  20  Supine Hip Extension P/ROM >0 degrees >0 degrees       IMAGING: via chart   07/24/22 DG BONE density " ASSESSMENT: The probability of a major osteoporotic fracture is 15.8% within the next ten years.   The probability of a hip fracture is 2.9% within the next ten years."   12/22/21 DG lumbar spine " IMPRESSION: 1. No acute displaced fracture or traumatic listhesis of the lumbar spine. 2. Interval development of grade 1 anterolisthesis of L3 on L4. Stable grade 1 anterolisthesis of L4 on L5. Appears stable on flexion extension views. 3.  Aortic Atherosclerosis (ICD10-I70.0)."    New from chart:  DG Lumbar spine 01/05/23: "IMPRESSION: 1. Lumbar spine scoliosis concave right. Diffuse multilevel degenerative change. Disc degeneration most prominent at L2-L3 and L5-S1. 5 mm anterolisthesis L4 on L5. Diffuse severe facet hypertrophy. No flexion or extension abnormality identified. No evidence of  fracture. 2. Sclerotic densities are noted in the L2 and L4 vertebral bodies. Although these could represent bone islands blastic metastatic disease cannot be excluded. MRI of the lumbar spine suggested for further evaluation. 3. Large amount of stool noted throughout the colon. 4. Aortoiliac atherosclerotic vascular disease."    DG hip 01/05/23: "IMPRESSION: Moderate bilateral femoroacetabular osteoarthritis, similar to prior."   PATIENT EDUCATION:  Education details: Pt educated throughout session about proper posture and technique with exercises. Improved exercise technique, movement at target joints, use of target muscles after min to mod verbal, visual, tactile cues. LSVT BIG interventions Person educated: Patient and Spouse Education method: Explanation, Demonstration, and Verbal cues Education comprehension: verbalized understanding, returned demonstration, verbal cues required, tactile cues required, and needs further education   TODAY'S TREATMENT:                                                                                                                                         DATE: 03/02/23  Prior to interventions, vitals: 117/63 mmHg, HR 87 bpm  TE:  5# AW donned each LE, ambulation for endurance with 4WW 296 ft with one brief seated rest break. Pt reports feeling some calf discomfort  5# AW removed: Pt ambulates 370 ft with 4WW before requiring rest break. Rates medium-hard, no pain with interventions Pt then completes another 296 ft with 4WW but requires rest break with second lap.  STS with shoulder abduction with 1# weights on wrists 2x10. Pt  provides vc/tc  5# AW donned: LAQ 2x15 each LE  March 2x15 each LE  Pt rates the above 2 interventions as medium-hard. Demo required  NMR:  Tandem stance 2x30 sec each LE. Able to sustain balance for at least 20 seconds      HOME EXERCISE PROGRAM:   Access Code: [email or web address] URL: [email or web address] Date: 12/22/2022 Prepared by: Temple Pacini  Exercises - Sit to Stand with Counter Support  - 1 x daily - 5 x weekly - 3 sets - 10 reps  GOALS: Goals reviewed with patient? Yes  SHORT TERM GOALS: Target date: 10/27/2022    Patient will be independent in home exercise program to improve strength/mobility for better functional independence with ADLs. Baseline: LSVT BIG protocol to be initiated next session; 12/18: not yet fully indep, reports she still gets confused about the technique; 12/03/2022: HEP to be updated; Pt reports practicing HEP 2x/week; 02/23/23: not currently performing HEP, reviewed in session and provided new copy  Goal status: IN PROGRESS   LONG TERM GOALS: Target date: 11/11/2022   Patient will increase FOTO score to equal to or greater than 49 to demonstrate statistically significant improvement in mobility and quality of life.  Baseline: 40; 12/18: deferred due to time; 45; 3/19: deferred to next visit; 01/21/2023; 02/23/2023: 46 (unchanged from previous assessment) Goal status: PARTIALLY MET  2.  Patient (> 42 years old) will complete five times sit  to stand test in < 15 seconds indicating decreased fall risk.  Baseline: 27 sec; 12/18 23 sec hands-free; 11/04/22: 24 sec hands free; 2/1/124: 22.7 sec use of BUE, 21.3 sec hands-free; 01/19/23: 18 seconds hands-free; 02/23/23: 19 seconds hands-free Goal status: IN PROGRESS  3.  Patient will increase 10 meter walk test to >1.36m/s with WFL B step-length, upright posture for >90% of test to improve ease with gait and to override hypokentic and bradykinetic movement.  Baseline: 0.58 m/s with 1OX; 12/18: 0.7 m/s  with 0RU; 11/04/22: 0.69 m/s with 0AV;  12/03/22: 0.73 m/s with 4UJ; 01/19/2023: 0.77 m/s with 8JX; 02/23/2023: 0.83 m/s with 4WW Goal status: IN PROGRESS  4.  Patient will reduce time to don jacket by at least 5 seconds in order to increase ease with dressing and override bradykinetic movement Baseline: 17 seconds; 12/18: 12 seconds Goal status: MET  5.  Pt will demo or report ability to open standard jar using only 1-2 attempts in order to exhibit improved functional mobility for ADLs.  Baseline: 5.7 seconds with multiple attempts to turn jar lid to open; 12/18: 2.8 sec opens in 2 turns Goal status: MET  6.  Patient will increase Berg Balance score by > 6 points to demonstrate decreased fall risk during functional activities. Baseline: to be tested next 1-2 visits; 12/03/2022: 44/56; 01/21/23 45/56; 48/56  Goal status: In Progress  7.  Patient will report at least 4 days in a row with minimal to no L hip pain in order to improve QOL and ease/safety with functional mobility. Baseline: Pt reports pain majority of days in L hip; 12/03/2022: pt reports having hip pain about every day; 3/19: rates hip pain daily (being seen now by MD, new imaging); 02/23/2023: pt went for period of time without hip pain (she thinks a week), but reports it has now come back, Goal status: In Progress  8. Patient will increase six minute walk test distance to >1000 for progression to community ambulator and improve gait ability Baseline:02/23/2023: 722 ft with 4WW and two standing rest breaks, rates test as hard Goal status: NEW  ASSESSMENT:  CLINICAL IMPRESSION: Pt able to advance distanced ambulated prior to requiring a rest break, indicating increased activity tolerance. She was only able to do this for one set, however, and then was limited due to fatigue. She thinks this may be in part due to an active weekend where she had company. The pt will benefit from further skilled PT to improve strength, balance, gait and  mobility.  OBJECTIVE IMPAIRMENTS: Abnormal gait, decreased balance, decreased cognition, decreased coordination, decreased endurance, decreased knowledge of use of DME, decreased mobility, difficulty walking, decreased strength, dizziness, hypomobility, impaired UE functional use, improper body mechanics, postural dysfunction, and pain.   ACTIVITY LIMITATIONS: lifting, bending, standing, squatting, stairs, transfers, bed mobility, bathing, dressing, hygiene/grooming, and locomotion level  PARTICIPATION LIMITATIONS: meal prep, cleaning, laundry, medication management, personal finances, driving, shopping, community activity, and yard work  PERSONAL FACTORS: Age, Fitness, Sex, Time since onset of injury/illness/exacerbation, and 3+ comorbidities: PMH includes hx of breast cancer, HTN, MCI, insomnia, cataracts, bipolar 1, alcohol use disorder, GAD, MDD, osteopenia of neck of R femur, chronic B low back pain with radiating pain to buttock and posterior thighs and calves. Pt with multiple comorbidities, please refer to chart for full details  are also affecting patient's functional outcome.   REHAB POTENTIAL: Fair    CLINICAL DECISION MAKING: Evolving/moderate complexity  EVALUATION COMPLEXITY: High  PLAN:  PT FREQUENCY: 2x/week  PT DURATION: 8 weeks  PLANNED INTERVENTIONS: Therapeutic exercises, Therapeutic activity, Neuromuscular re-education, Balance training, Gait training, Patient/Family education, Self Care, Joint mobilization, Stair training, Vestibular training, Canalith repositioning, Visual/preceptual remediation/compensation, Orthotic/Fit training, DME instructions, Wheelchair mobility training, Spinal mobilization, Cryotherapy, Moist heat, Splintting, Taping, and Manual therapy  PLAN FOR NEXT SESSION: hamtrings loading, hip strength, mobility, endurance, gait and balance   1:53 PM, 03/02/23 Temple Pacini PT, DPT  Physical Therapist - [facility]   [facility] [phone]     Baird Kay, PT 03/02/2023, 1:53 PM

## 2023-03-04 ENCOUNTER — Ambulatory Visit: Payer: Medicare PPO | Attending: Family Medicine

## 2023-03-04 DIAGNOSIS — M25552 Pain in left hip: Secondary | ICD-10-CM | POA: Diagnosis not present

## 2023-03-04 DIAGNOSIS — R262 Difficulty in walking, not elsewhere classified: Secondary | ICD-10-CM | POA: Insufficient documentation

## 2023-03-04 DIAGNOSIS — R2681 Unsteadiness on feet: Secondary | ICD-10-CM | POA: Diagnosis not present

## 2023-03-04 DIAGNOSIS — R42 Dizziness and giddiness: Secondary | ICD-10-CM | POA: Insufficient documentation

## 2023-03-04 DIAGNOSIS — R278 Other lack of coordination: Secondary | ICD-10-CM | POA: Insufficient documentation

## 2023-03-04 DIAGNOSIS — M6281 Muscle weakness (generalized): Secondary | ICD-10-CM

## 2023-03-04 NOTE — Therapy (Signed)
OUTPATIENT NEURO PHYSICAL THERAPY TREATMENT    Patient Name: Kendra [name] MRN: [id number] DOB:1946/09/07, 77 y.o., female Today's Date: 03/04/2023  PCP: Erasmo Downer, MD REFERRING PROVIDER: Erasmo Downer, MD  END OF SESSION:  PT End of Session - 03/04/23 1305     Visit Number 33    Number of Visits 39    Date for PT Re-Evaluation 03/16/23    Authorization Type [employer] PPO    Authorization Time Period 11/04/2022-12/30/2022    Progress Note Due on Visit 30    PT Start Time 1303    PT Stop Time 1343    PT Time Calculation (min) 40 min    Equipment Utilized During Treatment Gait belt    Activity Tolerance Patient tolerated treatment well;Patient limited by fatigue    Behavior During Therapy WFL for tasks assessed/performed                       Past Medical History:  Diagnosis Date   Anxiety    Breast cancer (HCC) 03/2018   right breast cancer at 11:00 and 1:00   Bursitis    bilateral hips and knees   Complication of anesthesia    Depression    Family history of breast cancer 03/24/2018   Family history of lung cancer 03/24/2018   History of alcohol dependence (HCC) 2007   no ETOH; resolved since 2007   History of hiatal hernia    History of psychosis 2014   due to sleep disturbance   Hypertension    Parkinson disease    Personal history of radiation therapy 06/2018-07/2018   right breast ca   PONV (postoperative nausea and vomiting)    Past Surgical History:  Procedure Laterality Date   BREAST BIOPSY Right 03/14/2018   11:00 DCIS and invasive ductal carcinoma   BREAST BIOPSY Right 03/14/2018   1:00 Invasive ductal carcinoma   BREAST LUMPECTOMY Right 04/27/2018   lumpectomy of 11 and 1:00 cancers, clear margins, negative LN   BREAST LUMPECTOMY WITH RADIOACTIVE SEED AND SENTINEL LYMPH NODE BIOPSY Right 04/27/2018   Procedure: RIGHT BREAST LUMPECTOMY WITH RADIOACTIVE SEED X 2 AND RIGHT SENTINEL LYMPH NODE BIOPSY;  Surgeon:  Glenna Fellows, MD;  Location: [facility];  Service: General;  Laterality: Right;   CATARACT EXTRACTION W/PHACO Left 08/30/2019   Procedure: CATARACT EXTRACTION PHACO AND INTRAOCULAR LENS PLACEMENT (IOC) LEFT panoptix toric  01:20.5  12.7%  10.26;  Surgeon: Lockie Mola, MD;  Location: [facility];  Service: Ophthalmology;  Laterality: Left;   CATARACT EXTRACTION W/PHACO Right 09/20/2019   Procedure: CATARACT EXTRACTION PHACO AND INTRAOCULAR LENS PLACEMENT (IOC) RIGHT 5.71  00:36.4  15.7%;  Surgeon: Lockie Mola, MD;  Location: [facility];  Service: Ophthalmology;  Laterality: Right;   LAPAROTOMY N/A 02/10/2018   Procedure: EXPLORATORY LAPAROTOMY;  Surgeon: Shonna Chock, MD;  Location: [facility];  Service: Gynecology;  Laterality: N/A;   MASS EXCISION  01/2018   abdominal   OVARIAN CYST REMOVAL     SALPINGOOPHORECTOMY Bilateral 02/10/2018   Procedure: BILATERAL SALPINGO OOPHORECTOMY; PERITONEAL WASHINGS;  Surgeon: Shonna Chock, MD;  Location: [facility];  Service: Gynecology;  Laterality: Bilateral;   TONSILLECTOMY     TONSILLECTOMY AND ADENOIDECTOMY  1953   Patient Active Problem List   Diagnosis Date Noted   Prediabetes 12/15/2022   Chronic cough 08/17/2022   History of allergy to shellfish 02/24/2022   Chronic low back pain of over 3 months duration  02/04/2022   Osteoarthritis of  AC (acromioclavicular) joint (Left) 02/04/2022   Osteoarthritis of hip (Right) 02/04/2022   Impaction fracture of distal end of radius and ulna, sequela (Left) 02/04/2022   Osteoarthritis of 1st (thumb) carpometacarpal (CMC) joint of hand (Left) 02/04/2022   Lumbosacral facet arthropathy (Multilevel) (Bilateral) (T12-S1) 02/04/2022   Lumbar central spinal stenosis w/o neurogenic claudication (L2-3, L3-4, L4-5) 02/04/2022   Lumbar foraminal stenosis (Bilateral: L2-3, L4-5) (Right: (Severe) L4-5) 02/04/2022   Lumbar lateral recess stenosis (Bilateral: L2-3,  L3-4, and L4-5) (Severe) 02/04/2022   DDD (degenerative disc disease), lumbar 02/04/2022   Lumbosacral facet syndrome (Multilevel) (Bilateral) 02/04/2022   Osteoarthritis of lumbar spine 02/04/2022   Allergic conjunctivitis of both eyes 12/23/2021   Chronic hip pain (3ry area of Pain) (Bilateral) (L>R) 12/22/2021   Chronic lower extremity pain (2ry area of Pain) (Bilateral) (L>R) 12/22/2021   Abnormal MRI, lumbar spine (01/31/2020) 12/22/2021   Grade 1 Anterolisthesis of lumbar spine (L3/L4) & (5 mm) (L4/L5) 12/22/2021   Chronic pain syndrome 11/17/2021   High risk medication use 11/17/2021   Disorder of skeletal system 11/17/2021   Problems influencing health status 11/17/2021   Other hyperlipidemia 10/21/2021   Polypharmacy 08/12/2021   Chronic low back pain (1ry area of Pain) (Bilateral) (L>R) w/o sciatica 06/18/2021   Osteopenia of neck of femur (Right) 09/06/2020   Obesity 08/29/2020   Parkinson disease 02/22/2020   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   Bipolar disorder, in full remission, most recent episode depressed (HCC) 11/08/2019   Vitamin D insufficiency 08/24/2019   Insomnia due to mental condition 05/31/2019   Bipolar I disorder, most recent episode depressed (HCC) 05/31/2019   Alcohol use disorder, moderate, in sustained remission (HCC) 05/31/2019   Elevated tumor markers 05/27/2019   GAD (generalized anxiety disorder) 04/10/2019   MDD (major depressive disorder), severe (HCC) 01/31/2019   High serum carbohydrate antigen 19-9 (CA19-9) 12/01/2018   Adjustment disorder with anxious mood 11/24/2018   Essential hypertension 11/24/2018   B12 deficiency 10/17/2018   Incisional hernia, without obstruction or gangrene 09/12/2018   Genetic testing 04/07/2018   Family history of breast cancer 03/24/2018   Family history of lung cancer 03/24/2018   Malignant neoplasm of upper-outer quadrant of right breast in female, estrogen receptor positive (HCC) 03/17/2018   Malignant  neoplasm of upper-inner quadrant of right breast in female, estrogen receptor positive (HCC) 03/17/2018   History of psychosis 02/07/2018   History of insomnia 02/07/2018    ONSET DATE: Pt diagnosed with PD in 2019  REFERRING DIAG:  G20 (ICD-10-CM) - Parkinson's disease      THERAPY DIAG:  Muscle weakness (generalized)  Difficulty in walking, not elsewhere classified  Unsteadiness on feet  Pain in left hip  Rationale for Evaluation and Treatment: Rehabilitation  SUBJECTIVE: Pt reports no changes since last visit, no current concerns. Pt still reporting L hip pain.                                                                           Pain: L hip pain Pt accompanied by: significant other self  PERTINENT HISTORY:  Pt is a pleasant 77 yo female who was diagnosed with PD in 2019. She is known to clinic and  author, and was last seen for LSVT BIG Sept-Oct 2022. Pt with chronic pain and has become increasingly sedentary as a result exacerbating mobility difficulties due to PD. Current concerns: Pt reports L and R hip pain and back pain. Pt was being seen by pain management clinic. Pt has had injections that provided relief at the time. Pt's spouse reports her physician determined surgery was not an option for her pain so the plan is to manage it. Pt spouse thinks pain has resulted in reluctance for pt to move. Pt currently sedentary, doesn't walk much, but when she does she holds on to furniture for support due to balance problems. When pt first stands she must hold on to something due to mix of pain and/or dizziness (described as lightheadedness). Her pain can go from 3/10 to 9/10. It does improve once she starts moving. Pt has increased difficulty with multiple activities including: getting in and out of her bed, getting in and out of her car, freezing, decreased gait speed, decreased balance and cognition, and difficulty using BUE due to tremors.  Per chart other PMH includes hx of  breast cancer, HTN, MCI, insomnia, cataracts, bipolar 1, alcohol use disorder, GAD, MDD, osteopenia of neck of R femur, chronic B low back pain with radiating pain to buttock and posterior thighs and calves. Pt with multiple comorbidities, please refer to chart for full details    PAIN: from eval Are you having pain? Yes: NPRS scale: not rated currently but can reach 9/10 Pain location: L hip and L leg Pain description: pt has difficulty describing Aggravating factors: unsure Relieving factors: laying in the recliner helps, movement  PRECAUTIONS: Fall  WEIGHT BEARING RESTRICTIONS: No  FALLS: Has patient fallen in last 6 months? Yes. Number of falls 1x, leg tangled in chair leg when getting up from a chair  PLOF: Independent with basic ADLs  PATIENT GOALS: improve mobility   BLE Strength Assessment      Right  Left  Hip Flexion 5/5 4/5 (painful)  Hip Horizontal ABDCT  5/5 5/5  Hip Horizontal ADD 4+/5 4+/5  Hip IR (seated)  4+/5 4+/5  Hip ER (seated) 5/5 5/5  Knee Extension 5/5 5/5  Knee Flexion (seated) 4+/5  5/5 (not painful)  Hip ABDCT (supine)  3+/5 4-/5   Focal popliteal edema Left knee, not painful   ROM Assessment Hip     Right  Left   Flexion Supine 132 120 (ouch)  External rotation (90/90) 30 47  Internal Rotation (90/90) 40  20  Supine Hip Extension P/ROM >0 degrees >0 degrees       IMAGING: via chart   07/24/22 DG BONE density " ASSESSMENT: The probability of a major osteoporotic fracture is 15.8% within the next ten years.   The probability of a hip fracture is 2.9% within the next ten years."   12/22/21 DG lumbar spine " IMPRESSION: 1. No acute displaced fracture or traumatic listhesis of the lumbar spine. 2. Interval development of grade 1 anterolisthesis of L3 on L4. Stable grade 1 anterolisthesis of L4 on L5. Appears stable on flexion extension views. 3.  Aortic Atherosclerosis (ICD10-I70.0)."   New from chart:  DG Lumbar spine 01/05/23:  "IMPRESSION: 1. Lumbar spine scoliosis concave right. Diffuse multilevel degenerative change. Disc degeneration most prominent at L2-L3 and L5-S1. 5 mm anterolisthesis L4 on L5. Diffuse severe facet hypertrophy. No flexion or extension abnormality identified. No evidence of fracture. 2. Sclerotic densities are noted in the L2 and L4 vertebral bodies.  Although these could represent bone islands blastic metastatic disease cannot be excluded. MRI of the lumbar spine suggested for further evaluation. 3. Large amount of stool noted throughout the colon. 4. Aortoiliac atherosclerotic vascular disease."    DG hip 01/05/23: "IMPRESSION: Moderate bilateral femoroacetabular osteoarthritis, similar to prior."   PATIENT EDUCATION:  Education details: Pt educated throughout session about proper posture and technique with exercises. Improved exercise technique, movement at target joints, use of target muscles after min to mod verbal, visual, tactile cues. LSVT BIG interventions Person educated: Patient and Spouse Education method: Explanation, Demonstration, and Verbal cues Education comprehension: verbalized understanding, returned demonstration, verbal cues required, tactile cues required, and needs further education   TODAY'S TREATMENT:                                                                                                                                         DATE: 03/04/23    TE: Pt ambulates 370 ft with 4WW before requiring rest break. Rates medium-hard.  --Pt then completed an additional 148 ft before requiring a rest break due to fatigue  STS with 2# bar overhead press 3x10. Medium-difficult. Fatiguing  Nustep lvl 1-2 x 5 min total, SPM 40. Close CGA for mount/dismount. Cuing for technique. Performed to promote cardio and LE mm endurance  5# AW donned: seated LAQ 2x15 each LE  March 1x15 each LE   Large amplitude STS technique with UE abduction followed by twist and clap  10x     HOME EXERCISE PROGRAM:   Access Code: [email or web address] URL: [email or web address] Date: 12/22/2022 Prepared by: Temple Pacini  Exercises - Sit to Stand with Counter Support  - 1 x daily - 5 x weekly - 3 sets - 10 reps  GOALS: Goals reviewed with patient? Yes  SHORT TERM GOALS: Target date: 10/27/2022    Patient will be independent in home exercise program to improve strength/mobility for better functional independence with ADLs. Baseline: LSVT BIG protocol to be initiated next session; 12/18: not yet fully indep, reports she still gets confused about the technique; 12/03/2022: HEP to be updated; Pt reports practicing HEP 2x/week; 02/23/23: not currently performing HEP, reviewed in session and provided new copy  Goal status: IN PROGRESS   LONG TERM GOALS: Target date: 11/11/2022   Patient will increase FOTO score to equal to or greater than 49 to demonstrate statistically significant improvement in mobility and quality of life.  Baseline: 40; 12/18: deferred due to time; 45; 3/19: deferred to next visit; 01/21/2023; 02/23/2023: 46 (unchanged from previous assessment) Goal status: PARTIALLY MET  2.  Patient (> 37 years old) will complete five times sit to stand test in < 15 seconds indicating decreased fall risk.  Baseline: 27 sec; 12/18 23 sec hands-free; 11/04/22: 24 sec hands free; 2/1/124: 22.7 sec use of BUE, 21.3 sec hands-free; 01/19/23: 18 seconds hands-free; 02/23/23: 19 seconds hands-free Goal status: IN PROGRESS  3.  Patient will increase 10 meter walk test to >1.43m/s with WFL B step-length, upright posture for >90% of test to improve ease with gait and to override hypokentic and bradykinetic movement.  Baseline: 0.58 m/s with 3KG; 12/18: 0.7 m/s with 4WN; 11/04/22: 0.69 m/s with 0UV;  12/03/22: 0.73 m/s with 2ZD; 01/19/2023: 0.77 m/s with 6UY; 02/23/2023: 0.83 m/s with 4WW Goal status: IN PROGRESS  4.  Patient will reduce time to don jacket by at least 5 seconds in  order to increase ease with dressing and override bradykinetic movement Baseline: 17 seconds; 12/18: 12 seconds Goal status: MET  5.  Pt will demo or report ability to open standard jar using only 1-2 attempts in order to exhibit improved functional mobility for ADLs.  Baseline: 5.7 seconds with multiple attempts to turn jar lid to open; 12/18: 2.8 sec opens in 2 turns Goal status: MET  6.  Patient will increase Berg Balance score by > 6 points to demonstrate decreased fall risk during functional activities. Baseline: to be tested next 1-2 visits; 12/03/2022: 44/56; 01/21/23 45/56; 48/56  Goal status: In Progress  7.  Patient will report at least 4 days in a row with minimal to no L hip pain in order to improve QOL and ease/safety with functional mobility. Baseline: Pt reports pain majority of days in L hip; 12/03/2022: pt reports having hip pain about every day; 3/19: rates hip pain daily (being seen now by MD, new imaging); 02/23/2023: pt went for period of time without hip pain (she thinks a week), but reports it has now come back, Goal status: In Progress  8. Patient will increase six minute walk test distance to >1000 for progression to community ambulator and improve gait ability Baseline:02/23/2023: 722 ft with 4WW and two standing rest breaks, rates test as hard Goal status: NEW  ASSESSMENT:  CLINICAL IMPRESSION: PT slowly making endurance gains AEB ability to repeat increased ambulation from last visit for one round today. Pt unable to complete a second full round due to fatigue.  The pt will benefit from further skilled PT to improve strength, balance, gait and mobility.  OBJECTIVE IMPAIRMENTS: Abnormal gait, decreased balance, decreased cognition, decreased coordination, decreased endurance, decreased knowledge of use of DME, decreased mobility, difficulty walking, decreased strength, dizziness, hypomobility, impaired UE functional use, improper body mechanics, postural dysfunction, and  pain.   ACTIVITY LIMITATIONS: lifting, bending, standing, squatting, stairs, transfers, bed mobility, bathing, dressing, hygiene/grooming, and locomotion level  PARTICIPATION LIMITATIONS: meal prep, cleaning, laundry, medication management, personal finances, driving, shopping, community activity, and yard work  PERSONAL FACTORS: Age, Fitness, Sex, Time since onset of injury/illness/exacerbation, and 3+ comorbidities: PMH includes hx of breast cancer, HTN, MCI, insomnia, cataracts, bipolar 1, alcohol use disorder, GAD, MDD, osteopenia of neck of R femur, chronic B low back pain with radiating pain to buttock and posterior thighs and calves. Pt with multiple comorbidities, please refer to chart for full details  are also affecting patient's functional outcome.   REHAB POTENTIAL: Fair    CLINICAL DECISION MAKING: Evolving/moderate complexity  EVALUATION COMPLEXITY: High  PLAN:  PT FREQUENCY: 2x/week  PT DURATION: 8 weeks  PLANNED INTERVENTIONS: Therapeutic exercises, Therapeutic activity, Neuromuscular re-education, Balance training, Gait training, Patient/Family education, Self Care, Joint mobilization, Stair training, Vestibular training, Canalith repositioning, Visual/preceptual remediation/compensation, Orthotic/Fit training, DME instructions, Wheelchair mobility training, Spinal mobilization, Cryotherapy, Moist heat, Splintting, Taping, and Manual therapy  PLAN FOR NEXT SESSION: hamtrings loading, hip strength, mobility, endurance, gait and balance   1:58  PM, 03/04/23 Temple Pacini PT, DPT  Physical Therapist - [facility]  [facility] [phone]     Baird Kay, [place] 03/04/2023, 1:58 PM

## 2023-03-09 ENCOUNTER — Ambulatory Visit: Payer: Medicare PPO

## 2023-03-09 DIAGNOSIS — R42 Dizziness and giddiness: Secondary | ICD-10-CM | POA: Diagnosis not present

## 2023-03-09 DIAGNOSIS — R2681 Unsteadiness on feet: Secondary | ICD-10-CM

## 2023-03-09 DIAGNOSIS — R278 Other lack of coordination: Secondary | ICD-10-CM | POA: Diagnosis not present

## 2023-03-09 DIAGNOSIS — M25552 Pain in left hip: Secondary | ICD-10-CM

## 2023-03-09 DIAGNOSIS — R262 Difficulty in walking, not elsewhere classified: Secondary | ICD-10-CM

## 2023-03-09 DIAGNOSIS — H6063 Unspecified chronic otitis externa, bilateral: Secondary | ICD-10-CM | POA: Diagnosis not present

## 2023-03-09 DIAGNOSIS — M6281 Muscle weakness (generalized): Secondary | ICD-10-CM

## 2023-03-09 DIAGNOSIS — G20C Parkinsonism, unspecified: Secondary | ICD-10-CM | POA: Diagnosis not present

## 2023-03-09 DIAGNOSIS — H6123 Impacted cerumen, bilateral: Secondary | ICD-10-CM | POA: Diagnosis not present

## 2023-03-09 DIAGNOSIS — H903 Sensorineural hearing loss, bilateral: Secondary | ICD-10-CM | POA: Diagnosis not present

## 2023-03-09 NOTE — Therapy (Signed)
OUTPATIENT NEURO PHYSICAL THERAPY TREATMENT    Patient Name: Kendra [name] MRN: [id number] DOB:August 12, 1946, 77 y.o., female Today's Date: 03/09/2023  PCP: Erasmo Downer, MD REFERRING PROVIDER: Erasmo Downer, MD  END OF SESSION:  PT End of Session - 03/09/23 1350     Visit Number 34    Number of Visits 39    Date for PT Re-Evaluation 03/16/23    Authorization Type [employer] PPO    Authorization Time Period 11/04/2022-12/30/2022    Progress Note Due on Visit 30    PT Start Time 1302    PT Stop Time 1345    PT Time Calculation (min) 43 min    Equipment Utilized During Treatment Gait belt    Activity Tolerance Patient tolerated treatment well;Patient limited by fatigue    Behavior During Therapy WFL for tasks assessed/performed                       Past Medical History:  Diagnosis Date   Anxiety    Breast cancer (HCC) 03/2018   right breast cancer at 11:00 and 1:00   Bursitis    bilateral hips and knees   Complication of anesthesia    Depression    Family history of breast cancer 03/24/2018   Family history of lung cancer 03/24/2018   History of alcohol dependence (HCC) 2007   no ETOH; resolved since 2007   History of hiatal hernia    History of psychosis 2014   due to sleep disturbance   Hypertension    Parkinson disease    Personal history of radiation therapy 06/2018-07/2018   right breast ca   PONV (postoperative nausea and vomiting)    Past Surgical History:  Procedure Laterality Date   BREAST BIOPSY Right 03/14/2018   11:00 DCIS and invasive ductal carcinoma   BREAST BIOPSY Right 03/14/2018   1:00 Invasive ductal carcinoma   BREAST LUMPECTOMY Right 04/27/2018   lumpectomy of 11 and 1:00 cancers, clear margins, negative LN   BREAST LUMPECTOMY WITH RADIOACTIVE SEED AND SENTINEL LYMPH NODE BIOPSY Right 04/27/2018   Procedure: RIGHT BREAST LUMPECTOMY WITH RADIOACTIVE SEED X 2 AND RIGHT SENTINEL LYMPH NODE BIOPSY;  Surgeon:  Glenna Fellows, MD;  Location: [facility];  Service: General;  Laterality: Right;   CATARACT EXTRACTION W/PHACO Left 08/30/2019   Procedure: CATARACT EXTRACTION PHACO AND INTRAOCULAR LENS PLACEMENT (IOC) LEFT panoptix toric  01:20.5  12.7%  10.26;  Surgeon: Lockie Mola, MD;  Location: [facility];  Service: Ophthalmology;  Laterality: Left;   CATARACT EXTRACTION W/PHACO Right 09/20/2019   Procedure: CATARACT EXTRACTION PHACO AND INTRAOCULAR LENS PLACEMENT (IOC) RIGHT 5.71  00:36.4  15.7%;  Surgeon: Lockie Mola, MD;  Location: [facility];  Service: Ophthalmology;  Laterality: Right;   LAPAROTOMY N/A 02/10/2018   Procedure: EXPLORATORY LAPAROTOMY;  Surgeon: Shonna Chock, MD;  Location: [facility];  Service: Gynecology;  Laterality: N/A;   MASS EXCISION  01/2018   abdominal   OVARIAN CYST REMOVAL     SALPINGOOPHORECTOMY Bilateral 02/10/2018   Procedure: BILATERAL SALPINGO OOPHORECTOMY; PERITONEAL WASHINGS;  Surgeon: Shonna Chock, MD;  Location: [facility];  Service: Gynecology;  Laterality: Bilateral;   TONSILLECTOMY     TONSILLECTOMY AND ADENOIDECTOMY  1953   Patient Active Problem List   Diagnosis Date Noted   Prediabetes 12/15/2022   Chronic cough 08/17/2022   History of allergy to shellfish 02/24/2022   Chronic low back pain of over 3 months duration  02/04/2022   Osteoarthritis of  AC (acromioclavicular) joint (Left) 02/04/2022   Osteoarthritis of hip (Right) 02/04/2022   Impaction fracture of distal end of radius and ulna, sequela (Left) 02/04/2022   Osteoarthritis of 1st (thumb) carpometacarpal (CMC) joint of hand (Left) 02/04/2022   Lumbosacral facet arthropathy (Multilevel) (Bilateral) (T12-S1) 02/04/2022   Lumbar central spinal stenosis w/o neurogenic claudication (L2-3, L3-4, L4-5) 02/04/2022   Lumbar foraminal stenosis (Bilateral: L2-3, L4-5) (Right: (Severe) L4-5) 02/04/2022   Lumbar lateral recess stenosis (Bilateral: L2-3,  L3-4, and L4-5) (Severe) 02/04/2022   DDD (degenerative disc disease), lumbar 02/04/2022   Lumbosacral facet syndrome (Multilevel) (Bilateral) 02/04/2022   Osteoarthritis of lumbar spine 02/04/2022   Allergic conjunctivitis of both eyes 12/23/2021   Chronic hip pain (3ry area of Pain) (Bilateral) (L>R) 12/22/2021   Chronic lower extremity pain (2ry area of Pain) (Bilateral) (L>R) 12/22/2021   Abnormal MRI, lumbar spine (01/31/2020) 12/22/2021   Grade 1 Anterolisthesis of lumbar spine (L3/L4) & (5 mm) (L4/L5) 12/22/2021   Chronic pain syndrome 11/17/2021   High risk medication use 11/17/2021   Disorder of skeletal system 11/17/2021   Problems influencing health status 11/17/2021   Other hyperlipidemia 10/21/2021   Polypharmacy 08/12/2021   Chronic low back pain (1ry area of Pain) (Bilateral) (L>R) w/o sciatica 06/18/2021   Osteopenia of neck of femur (Right) 09/06/2020   Obesity 08/29/2020   Parkinson disease 02/22/2020   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   Bipolar disorder, in full remission, most recent episode depressed (HCC) 11/08/2019   Vitamin D insufficiency 08/24/2019   Insomnia due to mental condition 05/31/2019   Bipolar I disorder, most recent episode depressed (HCC) 05/31/2019   Alcohol use disorder, moderate, in sustained remission (HCC) 05/31/2019   Elevated tumor markers 05/27/2019   GAD (generalized anxiety disorder) 04/10/2019   MDD (major depressive disorder), severe (HCC) 01/31/2019   High serum carbohydrate antigen 19-9 (CA19-9) 12/01/2018   Adjustment disorder with anxious mood 11/24/2018   Essential hypertension 11/24/2018   B12 deficiency 10/17/2018   Incisional hernia, without obstruction or gangrene 09/12/2018   Genetic testing 04/07/2018   Family history of breast cancer 03/24/2018   Family history of lung cancer 03/24/2018   Malignant neoplasm of upper-outer quadrant of right breast in female, estrogen receptor positive (HCC) 03/17/2018   Malignant  neoplasm of upper-inner quadrant of right breast in female, estrogen receptor positive (HCC) 03/17/2018   History of psychosis 02/07/2018   History of insomnia 02/07/2018    ONSET DATE: Pt diagnosed with PD in 2019  REFERRING DIAG:  G20 (ICD-10-CM) - Parkinson's disease      THERAPY DIAG:  Difficulty in walking, not elsewhere classified  Muscle weakness (generalized)  Unsteadiness on feet  Pain in left hip  Rationale for Evaluation and Treatment: Rehabilitation  SUBJECTIVE: Pt reports no changes since last visit, no current concerns or pain at the moment.                                                                 Pain: no pain currently Pt accompanied by: significant other self  PERTINENT HISTORY:  Pt is a pleasant 77 yo female who was diagnosed with PD in 2019. She is known to clinic and [facility], and was last seen for LSVT BIG Sept-Oct 2022. Pt  with chronic pain and has become increasingly sedentary as a result exacerbating mobility difficulties due to PD. Current concerns: Pt reports L and R hip pain and back pain. Pt was being seen by pain management clinic. Pt has had injections that provided relief at the time. Pt's spouse reports her physician determined surgery was not an option for her pain so the plan is to manage it. Pt spouse thinks pain has resulted in reluctance for pt to move. Pt currently sedentary, doesn't walk much, but when she does she holds on to furniture for support due to balance problems. When pt first stands she must hold on to something due to mix of pain and/or dizziness (described as lightheadedness). Her pain can go from 3/10 to 9/10. It does improve once she starts moving. Pt has increased difficulty with multiple activities including: getting in and out of her bed, getting in and out of her car, freezing, decreased gait speed, decreased balance and cognition, and difficulty using BUE due to tremors.  Per chart other PMH includes hx of breast cancer,  HTN, MCI, insomnia, cataracts, bipolar 1, alcohol use disorder, GAD, MDD, osteopenia of neck of R femur, chronic B low back pain with radiating pain to buttock and posterior thighs and calves. Pt with multiple comorbidities, please refer to chart for full details    PAIN: from eval Are you having pain? Yes: NPRS scale: not rated currently but can reach 9/10 Pain location: L hip and L leg Pain description: pt has difficulty describing Aggravating factors: unsure Relieving factors: laying in the recliner helps, movement  PRECAUTIONS: Fall  WEIGHT BEARING RESTRICTIONS: No  FALLS: Has patient fallen in last 6 months? Yes. Number of falls 1x, leg tangled in chair leg when getting up from a chair  PLOF: Independent with basic ADLs  PATIENT GOALS: improve mobility   BLE Strength Assessment      Right  Left  Hip Flexion 5/5 4/5 (painful)  Hip Horizontal ABDCT  5/5 5/5  Hip Horizontal ADD 4+/5 4+/5  Hip IR (seated)  4+/5 4+/5  Hip ER (seated) 5/5 5/5  Knee Extension 5/5 5/5  Knee Flexion (seated) 4+/5  5/5 (not painful)  Hip ABDCT (supine)  3+/5 4-/5   Focal popliteal edema Left knee, not painful   ROM Assessment Hip     Right  Left   Flexion Supine 132 120 (ouch)  External rotation (90/90) 30 47  Internal Rotation (90/90) 40  20  Supine Hip Extension P/ROM >0 degrees >0 degrees       IMAGING: via chart   07/24/22 DG BONE density " ASSESSMENT: The probability of a major osteoporotic fracture is 15.8% within the next ten years.   The probability of a hip fracture is 2.9% within the next ten years."   12/22/21 DG lumbar spine " IMPRESSION: 1. No acute displaced fracture or traumatic listhesis of the lumbar spine. 2. Interval development of grade 1 anterolisthesis of L3 on L4. Stable grade 1 anterolisthesis of L4 on L5. Appears stable on flexion extension views. 3.  Aortic Atherosclerosis (ICD10-I70.0)."   New from chart:  DG Lumbar spine 01/05/23: "IMPRESSION: 1.  Lumbar spine scoliosis concave right. Diffuse multilevel degenerative change. Disc degeneration most prominent at L2-L3 and L5-S1. 5 mm anterolisthesis L4 on L5. Diffuse severe facet hypertrophy. No flexion or extension abnormality identified. No evidence of fracture. 2. Sclerotic densities are noted in the L2 and L4 vertebral bodies. Although these could represent bone islands blastic metastatic disease cannot be  excluded. MRI of the lumbar spine suggested for further evaluation. 3. Large amount of stool noted throughout the colon. 4. Aortoiliac atherosclerotic vascular disease."    DG hip 01/05/23: "IMPRESSION: Moderate bilateral femoroacetabular osteoarthritis, similar to prior."   PATIENT EDUCATION:  Education details: Pt educated throughout session about proper posture and technique with exercises. Improved exercise technique, movement at target joints, use of target muscles after min to mod verbal, visual, tactile cues. LSVT BIG interventions Person educated: Patient and Spouse Education method: Explanation, Demonstration, and Verbal cues Education comprehension: verbalized understanding, returned demonstration, verbal cues required, tactile cues required, and needs further education   TODAY'S TREATMENT:                                                                                                                                         DATE: 03/09/23  NMR: Standing at [facility] [place] writing out alphabet x 1 round. Pt required one rest break due to some hip discomfort in standing. Intermittent UE support, pt rates tiring and somewhat difficult.  Alt step tap onto 6" step 2x18. Limited by fatigue, pt requires brief rest breaks. Intermittent UE support  Attempted SLB progression with one foot on floor, one on 6" step, discontinued after 15 sec (RLE on step) due to reports of L hip pain.  TE: Pt ambulates 2x300 ft with 4WW, 2# AW each LE. Pt requires rest breaks following each  set. Rates difficult.  Nustep interval training: Lvl 1 x 1 min Lvl 2 x 1 min Lvl 3 x 1 min Lvl 2 x 1 min  Lvl 1 x 1 min Performed to promote cardio and LE mm endurance. PT adjusts intensity throughout and monitors for pt for response to intervention. Close CGA for mount/dismount, min-mod cues for LE positioning and technique. Pt able ot maintain SPM in 40s-50s.     HOME EXERCISE PROGRAM:   Access Code: [email or web address] URL: [email or web address] Date: 12/22/2022 Prepared by: Temple Pacini  Exercises - Sit to Stand with Counter Support  - 1 x daily - 5 x weekly - 3 sets - 10 reps  GOALS: Goals reviewed with patient? Yes  SHORT TERM GOALS: Target date: 10/27/2022    Patient will be independent in home exercise program to improve strength/mobility for better functional independence with ADLs. Baseline: LSVT BIG protocol to be initiated next session; 12/18: not yet fully indep, reports she still gets confused about the technique; 12/03/2022: HEP to be updated; Pt reports practicing HEP 2x/week; 02/23/23: not currently performing HEP, reviewed in session and provided new copy  Goal status: IN PROGRESS   LONG TERM GOALS: Target date: 11/11/2022   Patient will increase FOTO score to equal to or greater than 49 to demonstrate statistically significant improvement in mobility and quality of life.  Baseline: 40; 12/18: deferred due to time; 45; 3/19: deferred to next visit; 01/21/2023; 02/23/2023: 46 (unchanged from previous assessment) Goal  status: PARTIALLY MET  2.  Patient (> 89 years old) will complete five times sit to stand test in < 15 seconds indicating decreased fall risk.  Baseline: 27 sec; 12/18 23 sec hands-free; 11/04/22: 24 sec hands free; 2/1/124: 22.7 sec use of BUE, 21.3 sec hands-free; 01/19/23: 18 seconds hands-free; 02/23/23: 19 seconds hands-free Goal status: IN PROGRESS  3.  Patient will increase 10 meter walk test to >1.65m/s with WFL B step-length, upright posture  for >90% of test to improve ease with gait and to override hypokentic and bradykinetic movement.  Baseline: 0.58 m/s with 1OX; 12/18: 0.7 m/s with 0RU; 11/04/22: 0.69 m/s with 0AV;  12/03/22: 0.73 m/s with 4UJ; 01/19/2023: 0.77 m/s with 8JX; 02/23/2023: 0.83 m/s with 4WW Goal status: IN PROGRESS  4.  Patient will reduce time to don jacket by at least 5 seconds in order to increase ease with dressing and override bradykinetic movement Baseline: 17 seconds; 12/18: 12 seconds Goal status: MET  5.  Pt will demo or report ability to open standard jar using only 1-2 attempts in order to exhibit improved functional mobility for ADLs.  Baseline: 5.7 seconds with multiple attempts to turn jar lid to open; 12/18: 2.8 sec opens in 2 turns Goal status: MET  6.  Patient will increase Berg Balance score by > 6 points to demonstrate decreased fall risk during functional activities. Baseline: to be tested next 1-2 visits; 12/03/2022: 44/56; 01/21/23 45/56; 48/56  Goal status: In Progress  7.  Patient will report at least 4 days in a row with minimal to no L hip pain in order to improve QOL and ease/safety with functional mobility. Baseline: Pt reports pain majority of days in L hip; 12/03/2022: pt reports having hip pain about every day; 3/19: rates hip pain daily (being seen now by MD, new imaging); 02/23/2023: pt went for period of time without hip pain (she thinks a week), but reports it has now come back, Goal status: In Progress  8. Patient will increase six minute walk test distance to >1000 for progression to community ambulator and improve gait ability Baseline:02/23/2023: 722 ft with 4WW and two standing rest breaks, rates test as hard Goal status: NEW  ASSESSMENT:  CLINICAL IMPRESSION: Pt requires intermittent UE support with all balance exercises. She continues to be limited as well by fatigue. Remainder of session focused on targeting endurance deficits. The pt will benefit from further skilled PT to  improve strength, balance, gait and mobility.  OBJECTIVE IMPAIRMENTS: Abnormal gait, decreased balance, decreased cognition, decreased coordination, decreased endurance, decreased knowledge of use of DME, decreased mobility, difficulty walking, decreased strength, dizziness, hypomobility, impaired UE functional use, improper body mechanics, postural dysfunction, and pain.   ACTIVITY LIMITATIONS: lifting, bending, standing, squatting, stairs, transfers, bed mobility, bathing, dressing, hygiene/grooming, and locomotion level  PARTICIPATION LIMITATIONS: meal prep, cleaning, laundry, medication management, personal finances, driving, shopping, community activity, and yard work  PERSONAL FACTORS: Age, Fitness, Sex, Time since onset of injury/illness/exacerbation, and 3+ comorbidities: PMH includes hx of breast cancer, HTN, MCI, insomnia, cataracts, bipolar 1, alcohol use disorder, GAD, MDD, osteopenia of neck of R femur, chronic B low back pain with radiating pain to buttock and posterior thighs and calves. Pt with multiple comorbidities, please refer to chart for full details  are also affecting patient's functional outcome.   REHAB POTENTIAL: Fair    CLINICAL DECISION MAKING: Evolving/moderate complexity  EVALUATION COMPLEXITY: High  PLAN:  PT FREQUENCY: 2x/week  PT DURATION: 8 weeks  PLANNED INTERVENTIONS:  Therapeutic exercises, Therapeutic activity, Neuromuscular re-education, Balance training, Gait training, Patient/Family education, Self Care, Joint mobilization, Stair training, Vestibular training, Canalith repositioning, Visual/preceptual remediation/compensation, Orthotic/Fit training, DME instructions, Wheelchair mobility training, Spinal mobilization, Cryotherapy, Moist heat, Splintting, Taping, and Manual therapy  PLAN FOR NEXT SESSION: hamtrings loading, hip strength, mobility, endurance, gait and balance   1:51 PM, 03/09/23 Temple Pacini PT, DPT  Physical Therapist - [facility]  [facility]  [facility] [phone]     Baird Kay, PT 03/09/2023, 1:51 PM

## 2023-03-11 ENCOUNTER — Ambulatory Visit: Payer: Medicare PPO

## 2023-03-11 DIAGNOSIS — R2681 Unsteadiness on feet: Secondary | ICD-10-CM | POA: Diagnosis not present

## 2023-03-11 DIAGNOSIS — M25552 Pain in left hip: Secondary | ICD-10-CM | POA: Diagnosis not present

## 2023-03-11 DIAGNOSIS — R42 Dizziness and giddiness: Secondary | ICD-10-CM | POA: Diagnosis not present

## 2023-03-11 DIAGNOSIS — R278 Other lack of coordination: Secondary | ICD-10-CM | POA: Diagnosis not present

## 2023-03-11 DIAGNOSIS — R262 Difficulty in walking, not elsewhere classified: Secondary | ICD-10-CM | POA: Diagnosis not present

## 2023-03-11 DIAGNOSIS — M6281 Muscle weakness (generalized): Secondary | ICD-10-CM

## 2023-03-11 NOTE — Therapy (Addendum)
OUTPATIENT NEURO PHYSICAL THERAPY TREATMENT    Patient Name: Kendra [name] MRN: [id number] DOB:1945-11-03, 77 y.o., female Today's Date: 03/11/2023  PCP: Erasmo Downer, MD REFERRING PROVIDER: Erasmo Downer, MD  END OF SESSION:  PT End of Session - 03/11/23 1306     Visit Number 35    Number of Visits 39    Date for PT Re-Evaluation 03/16/23    Authorization Type [employer] PPO    Authorization Time Period 11/04/2022-12/30/2022    Progress Note Due on Visit 30    PT Start Time 1301    PT Stop Time 1342    PT Time Calculation (min) 41 min    Equipment Utilized During Treatment Gait belt    Activity Tolerance Patient tolerated treatment well;Patient limited by fatigue    Behavior During Therapy WFL for tasks assessed/performed                       Past Medical History:  Diagnosis Date   Anxiety    Breast cancer (HCC) 03/2018   right breast cancer at 11:00 and 1:00   Bursitis    bilateral hips and knees   Complication of anesthesia    Depression    Family history of breast cancer 03/24/2018   Family history of lung cancer 03/24/2018   History of alcohol dependence (HCC) 2007   no ETOH; resolved since 2007   History of hiatal hernia    History of psychosis 2014   due to sleep disturbance   Hypertension    Parkinson disease    Personal history of radiation therapy 06/2018-07/2018   right breast ca   PONV (postoperative nausea and vomiting)    Past Surgical History:  Procedure Laterality Date   BREAST BIOPSY Right 03/14/2018   11:00 DCIS and invasive ductal carcinoma   BREAST BIOPSY Right 03/14/2018   1:00 Invasive ductal carcinoma   BREAST LUMPECTOMY Right 04/27/2018   lumpectomy of 11 and 1:00 cancers, clear margins, negative LN   BREAST LUMPECTOMY WITH RADIOACTIVE SEED AND SENTINEL LYMPH NODE BIOPSY Right 04/27/2018   Procedure: RIGHT BREAST LUMPECTOMY WITH RADIOACTIVE SEED X 2 AND RIGHT SENTINEL LYMPH NODE BIOPSY;  Surgeon:  Glenna Fellows, MD;  Location: [facility];  Service: General;  Laterality: Right;   CATARACT EXTRACTION W/PHACO Left 08/30/2019   Procedure: CATARACT EXTRACTION PHACO AND INTRAOCULAR LENS PLACEMENT (IOC) LEFT panoptix toric  01:20.5  12.7%  10.26;  Surgeon: Lockie Mola, MD;  Location: [facility];  Service: Ophthalmology;  Laterality: Left;   CATARACT EXTRACTION W/PHACO Right 09/20/2019   Procedure: CATARACT EXTRACTION PHACO AND INTRAOCULAR LENS PLACEMENT (IOC) RIGHT 5.71  00:36.4  15.7%;  Surgeon: Lockie Mola, MD;  Location: [facility];  Service: Ophthalmology;  Laterality: Right;   LAPAROTOMY N/A 02/10/2018   Procedure: EXPLORATORY LAPAROTOMY;  Surgeon: Shonna Chock, MD;  Location: [facility];  Service: Gynecology;  Laterality: N/A;   MASS EXCISION  01/2018   abdominal   OVARIAN CYST REMOVAL     SALPINGOOPHORECTOMY Bilateral 02/10/2018   Procedure: BILATERAL SALPINGO OOPHORECTOMY; PERITONEAL WASHINGS;  Surgeon: Shonna Chock, MD;  Location: [facility];  Service: Gynecology;  Laterality: Bilateral;   TONSILLECTOMY     TONSILLECTOMY AND ADENOIDECTOMY  1953   Patient Active Problem List   Diagnosis Date Noted   Prediabetes 12/15/2022   Chronic cough 08/17/2022   History of allergy to shellfish 02/24/2022   Chronic low back pain of over 3 months duration  02/04/2022   Osteoarthritis of  AC (acromioclavicular) joint (Left) 02/04/2022   Osteoarthritis of hip (Right) 02/04/2022   Impaction fracture of distal end of radius and ulna, sequela (Left) 02/04/2022   Osteoarthritis of 1st (thumb) carpometacarpal (CMC) joint of hand (Left) 02/04/2022   Lumbosacral facet arthropathy (Multilevel) (Bilateral) (T12-S1) 02/04/2022   Lumbar central spinal stenosis w/o neurogenic claudication (L2-3, L3-4, L4-5) 02/04/2022   Lumbar foraminal stenosis (Bilateral: L2-3, L4-5) (Right: (Severe) L4-5) 02/04/2022   Lumbar lateral recess stenosis (Bilateral: L2-3,  L3-4, and L4-5) (Severe) 02/04/2022   DDD (degenerative disc disease), lumbar 02/04/2022   Lumbosacral facet syndrome (Multilevel) (Bilateral) 02/04/2022   Osteoarthritis of lumbar spine 02/04/2022   Allergic conjunctivitis of both eyes 12/23/2021   Chronic hip pain (3ry area of Pain) (Bilateral) (L>R) 12/22/2021   Chronic lower extremity pain (2ry area of Pain) (Bilateral) (L>R) 12/22/2021   Abnormal MRI, lumbar spine (01/31/2020) 12/22/2021   Grade 1 Anterolisthesis of lumbar spine (L3/L4) & (5 mm) (L4/L5) 12/22/2021   Chronic pain syndrome 11/17/2021   High risk medication use 11/17/2021   Disorder of skeletal system 11/17/2021   Problems influencing health status 11/17/2021   Other hyperlipidemia 10/21/2021   Polypharmacy 08/12/2021   Chronic low back pain (1ry area of Pain) (Bilateral) (L>R) w/o sciatica 06/18/2021   Osteopenia of neck of femur (Right) 09/06/2020   Obesity 08/29/2020   Parkinson disease 02/22/2020   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   Bipolar disorder, in full remission, most recent episode depressed (HCC) 11/08/2019   Vitamin D insufficiency 08/24/2019   Insomnia due to mental condition 05/31/2019   Bipolar I disorder, most recent episode depressed (HCC) 05/31/2019   Alcohol use disorder, moderate, in sustained remission (HCC) 05/31/2019   Elevated tumor markers 05/27/2019   GAD (generalized anxiety disorder) 04/10/2019   MDD (major depressive disorder), severe (HCC) 01/31/2019   High serum carbohydrate antigen 19-9 (CA19-9) 12/01/2018   Adjustment disorder with anxious mood 11/24/2018   Essential hypertension 11/24/2018   B12 deficiency 10/17/2018   Incisional hernia, without obstruction or gangrene 09/12/2018   Genetic testing 04/07/2018   Family history of breast cancer 03/24/2018   Family history of lung cancer 03/24/2018   Malignant neoplasm of upper-outer quadrant of right breast in female, estrogen receptor positive (HCC) 03/17/2018   Malignant  neoplasm of upper-inner quadrant of right breast in female, estrogen receptor positive (HCC) 03/17/2018   History of psychosis 02/07/2018   History of insomnia 02/07/2018    ONSET DATE: Pt diagnosed with PD in 2019  REFERRING DIAG:  G20 (ICD-10-CM) - Parkinson's disease      THERAPY DIAG:  Muscle weakness (generalized)  Difficulty in walking, not elsewhere classified  Pain in left hip  Unsteadiness on feet  Rationale for Evaluation and Treatment: Rehabilitation  SUBJECTIVE: Pt  reports moderate L hip pain at the moment. She reports no other updates or falls.                                                                 Pain:  L hip  Pt accompanied by: significant other self  PERTINENT HISTORY:  Pt is a pleasant 77 yo female who was diagnosed with PD in 2019. She is known to clinic and [facility], and was last seen for LSVT BIG  Sept-Oct 2022. Pt with chronic pain and has become increasingly sedentary as a result exacerbating mobility difficulties due to PD. Current concerns: Pt reports L and R hip pain and back pain. Pt was being seen by pain management clinic. Pt has had injections that provided relief at the time. Pt's spouse reports her physician determined surgery was not an option for her pain so the plan is to manage it. Pt spouse thinks pain has resulted in reluctance for pt to move. Pt currently sedentary, doesn't walk much, but when she does she holds on to furniture for support due to balance problems. When pt first stands she must hold on to something due to mix of pain and/or dizziness (described as lightheadedness). Her pain can go from 3/10 to 9/10. It does improve once she starts moving. Pt has increased difficulty with multiple activities including: getting in and out of her bed, getting in and out of her car, freezing, decreased gait speed, decreased balance and cognition, and difficulty using BUE due to tremors.  Per chart other PMH includes hx of breast cancer, HTN,  MCI, insomnia, cataracts, bipolar 1, alcohol use disorder, GAD, MDD, osteopenia of neck of R femur, chronic B low back pain with radiating pain to buttock and posterior thighs and calves. Pt with multiple comorbidities, please refer to chart for full details    PAIN: from eval Are you having pain? Yes: NPRS scale: not rated currently but can reach 9/10 Pain location: L hip and L leg Pain description: pt has difficulty describing Aggravating factors: unsure Relieving factors: laying in the recliner helps, movement  PRECAUTIONS: Fall  WEIGHT BEARING RESTRICTIONS: No  FALLS: Has patient fallen in last 6 months? Yes. Number of falls 1x, leg tangled in chair leg when getting up from a chair  PLOF: Independent with basic ADLs  PATIENT GOALS: improve mobility   BLE Strength Assessment      Right  Left  Hip Flexion 5/5 4/5 (painful)  Hip Horizontal ABDCT  5/5 5/5  Hip Horizontal ADD 4+/5 4+/5  Hip IR (seated)  4+/5 4+/5  Hip ER (seated) 5/5 5/5  Knee Extension 5/5 5/5  Knee Flexion (seated) 4+/5  5/5 (not painful)  Hip ABDCT (supine)  3+/5 4-/5   Focal popliteal edema Left knee, not painful   ROM Assessment Hip     Right  Left   Flexion Supine 132 120 (ouch)  External rotation (90/90) 30 47  Internal Rotation (90/90) 40  20  Supine Hip Extension P/ROM >0 degrees >0 degrees       IMAGING: via chart   07/24/22 DG BONE density " ASSESSMENT: The probability of a major osteoporotic fracture is 15.8% within the next ten years.   The probability of a hip fracture is 2.9% within the next ten years."   12/22/21 DG lumbar spine " IMPRESSION: 1. No acute displaced fracture or traumatic listhesis of the lumbar spine. 2. Interval development of grade 1 anterolisthesis of L3 on L4. Stable grade 1 anterolisthesis of L4 on L5. Appears stable on flexion extension views. 3.  Aortic Atherosclerosis (ICD10-I70.0)."   New from chart:  DG Lumbar spine 01/05/23: "IMPRESSION: 1. Lumbar  spine scoliosis concave right. Diffuse multilevel degenerative change. Disc degeneration most prominent at L2-L3 and L5-S1. 5 mm anterolisthesis L4 on L5. Diffuse severe facet hypertrophy. No flexion or extension abnormality identified. No evidence of fracture. 2. Sclerotic densities are noted in the L2 and L4 vertebral bodies. Although these could represent bone islands blastic metastatic  disease cannot be excluded. MRI of the lumbar spine suggested for further evaluation. 3. Large amount of stool noted throughout the colon. 4. Aortoiliac atherosclerotic vascular disease."    DG hip 01/05/23: "IMPRESSION: Moderate bilateral femoroacetabular osteoarthritis, similar to prior."   PATIENT EDUCATION:  Education details: Pt educated throughout session about proper posture and technique with exercises. Improved exercise technique, movement at target joints, use of target muscles after min to mod verbal, visual, tactile cues. LSVT BIG interventions Person educated: Patient and Spouse Education method: Explanation, Demonstration, and Verbal cues Education comprehension: verbalized understanding, returned demonstration, verbal cues required, tactile cues required, and needs further education   TODAY'S TREATMENT:                                                                                                                                         DATE: 03/11/23  TE: Pt ambulates 444 ft with 4WW, 4# AW each LE, one 5 second standing rest break around 370 ft. Pt then repeats second round taking similar standing rest break. Pt requires rest breaks following each set. Pt fatigued with intervention.  4# AW donned: LAQ 2x15 each LE Alt LE march 14x, terminated once pt reports pain increase in L hip felt down LLE to knee Seated heel raise 1x20 bilat Seated DF 1x20  Nustep interval training: Lvls 1, 2, 3 x 5 min total  Performed to promote cardio and LE mm endurance. PT adjusts intensity  throughout and monitors for pt for response to intervention. Close CGA for mount/dismount, min-mod cues for LE positioning and technique. Pt able ot maintain SPM in 40s-50s. Pt rates medium. HR reaches 100 bpm immediately post-exercise, SPO2% 94%  BP, resting at end of session, seated 110/61 mmHg HR 91 bpm  HOME EXERCISE PROGRAM:   Access Code: [email or web address] URL: [email or web address] Date: 12/22/2022 Prepared by: Temple Pacini  Exercises - Sit to Stand with Counter Support  - 1 x daily - 5 x weekly - 3 sets - 10 reps  GOALS: Goals reviewed with patient? Yes  SHORT TERM GOALS: Target date: 10/27/2022    Patient will be independent in home exercise program to improve strength/mobility for better functional independence with ADLs. Baseline: LSVT BIG protocol to be initiated next session; 12/18: not yet fully indep, reports she still gets confused about the technique; 12/03/2022: HEP to be updated; Pt reports practicing HEP 2x/week; 02/23/23: not currently performing HEP, reviewed in session and provided new copy  Goal status: IN PROGRESS   LONG TERM GOALS: Target date: 11/11/2022   Patient will increase FOTO score to equal to or greater than 49 to demonstrate statistically significant improvement in mobility and quality of life.  Baseline: 40; 12/18: deferred due to time; 45; 3/19: deferred to next visit; 01/21/2023; 02/23/2023: 46 (unchanged from previous assessment) Goal status: PARTIALLY MET  2.  Patient (> 78 years old) will complete five times sit to stand  test in < 15 seconds indicating decreased fall risk.  Baseline: 27 sec; 12/18 23 sec hands-free; 11/04/22: 24 sec hands free; 2/1/124: 22.7 sec use of BUE, 21.3 sec hands-free; 01/19/23: 18 seconds hands-free; 02/23/23: 19 seconds hands-free Goal status: IN PROGRESS  3.  Patient will increase 10 meter walk test to >1.23m/s with WFL B step-length, upright posture for >90% of test to improve ease with gait and to override  hypokentic and bradykinetic movement.  Baseline: 0.58 m/s with 5AO; 12/18: 0.7 m/s with 1HY; 11/04/22: 0.69 m/s with 8MV;  12/03/22: 0.73 m/s with 7QI; 01/19/2023: 0.77 m/s with 6NG; 02/23/2023: 0.83 m/s with 4WW Goal status: IN PROGRESS  4.  Patient will reduce time to don jacket by at least 5 seconds in order to increase ease with dressing and override bradykinetic movement Baseline: 17 seconds; 12/18: 12 seconds Goal status: MET  5.  Pt will demo or report ability to open standard jar using only 1-2 attempts in order to exhibit improved functional mobility for ADLs.  Baseline: 5.7 seconds with multiple attempts to turn jar lid to open; 12/18: 2.8 sec opens in 2 turns Goal status: MET  6.  Patient will increase Berg Balance score by > 6 points to demonstrate decreased fall risk during functional activities. Baseline: to be tested next 1-2 visits; 12/03/2022: 44/56; 01/21/23 45/56; 48/56  Goal status: In Progress  7.  Patient will report at least 4 days in a row with minimal to no L hip pain in order to improve QOL and ease/safety with functional mobility. Baseline: Pt reports pain majority of days in L hip; 12/03/2022: pt reports having hip pain about every day; 3/19: rates hip pain daily (being seen now by MD, new imaging); 02/23/2023: pt went for period of time without hip pain (she thinks a week), but reports it has now come back, Goal status: In Progress  8. Patient will increase six minute walk test distance to >1000 for progression to community ambulator and improve gait ability Baseline:02/23/2023: 722 ft with 4WW and two standing rest breaks, rates test as hard Goal status: NEW  ASSESSMENT:  CLINICAL IMPRESSION: Pt shows progress by advancing gait endurance training with heavier weights donned, indicating improved functional capacity. Although pt shows progress, she still struggle with anterior lean/weight-shift with gait, unable to sustain upright posture without cues.The pt will benefit  from further skilled PT to improve strength, balance, gait and mobility.  OBJECTIVE IMPAIRMENTS: Abnormal gait, decreased balance, decreased cognition, decreased coordination, decreased endurance, decreased knowledge of use of DME, decreased mobility, difficulty walking, decreased strength, dizziness, hypomobility, impaired UE functional use, improper body mechanics, postural dysfunction, and pain.   ACTIVITY LIMITATIONS: lifting, bending, standing, squatting, stairs, transfers, bed mobility, bathing, dressing, hygiene/grooming, and locomotion level  PARTICIPATION LIMITATIONS: meal prep, cleaning, laundry, medication management, personal finances, driving, shopping, community activity, and yard work  PERSONAL FACTORS: Age, Fitness, Sex, Time since onset of injury/illness/exacerbation, and 3+ comorbidities: PMH includes hx of breast cancer, HTN, MCI, insomnia, cataracts, bipolar 1, alcohol use disorder, GAD, MDD, osteopenia of neck of R femur, chronic B low back pain with radiating pain to buttock and posterior thighs and calves. Pt with multiple comorbidities, please refer to chart for full details  are also affecting patient's functional outcome.   REHAB POTENTIAL: Fair    CLINICAL DECISION MAKING: Evolving/moderate complexity  EVALUATION COMPLEXITY: High  PLAN:  PT FREQUENCY: 2x/week  PT DURATION: 8 weeks  PLANNED INTERVENTIONS: Therapeutic exercises, Therapeutic activity, Neuromuscular re-education, Balance training, Gait training,  Patient/Family education, Self Care, Joint mobilization, Stair training, Vestibular training, Canalith repositioning, Visual/preceptual remediation/compensation, Orthotic/Fit training, DME instructions, Wheelchair mobility training, Spinal mobilization, Cryotherapy, Moist heat, Splintting, Taping, and Manual therapy  PLAN FOR NEXT SESSION: hamtrings loading, hip strength, mobility, endurance, gait and balance   1:42 PM, 03/11/23 Temple Pacini PT,  DPT  Physical Therapist - [facility]  [facility] [phone], PT 03/11/2023, 1:42 PM

## 2023-03-16 ENCOUNTER — Ambulatory Visit: Payer: Medicare PPO

## 2023-03-16 DIAGNOSIS — M6281 Muscle weakness (generalized): Secondary | ICD-10-CM

## 2023-03-16 DIAGNOSIS — M25552 Pain in left hip: Secondary | ICD-10-CM

## 2023-03-16 DIAGNOSIS — R2681 Unsteadiness on feet: Secondary | ICD-10-CM

## 2023-03-16 DIAGNOSIS — R262 Difficulty in walking, not elsewhere classified: Secondary | ICD-10-CM | POA: Diagnosis not present

## 2023-03-16 DIAGNOSIS — R42 Dizziness and giddiness: Secondary | ICD-10-CM | POA: Diagnosis not present

## 2023-03-16 DIAGNOSIS — R278 Other lack of coordination: Secondary | ICD-10-CM | POA: Diagnosis not present

## 2023-03-16 NOTE — Therapy (Signed)
OUTPATIENT NEURO PHYSICAL THERAPY TREATMENT/RECERT    Patient Name: Kendra [name] MRN: [id number] DOB:11-Feb-1946, 77 y.o., female Today's Date: 03/16/2023  PCP: Erasmo Downer, MD REFERRING PROVIDER: Erasmo Downer, MD  END OF SESSION:  PT End of Session - 03/16/23 1434     Visit Number 36    Number of Visits 60    Date for PT Re-Evaluation 06/08/23    Authorization Type [employer] PPO    Authorization Time Period 11/04/2022-12/30/2022    Progress Note Due on Visit 30    PT Start Time 1433    PT Stop Time 1515    PT Time Calculation (min) 42 min    Equipment Utilized During Treatment Gait belt    Activity Tolerance Patient tolerated treatment well;Patient limited by fatigue    Behavior During Therapy WFL for tasks assessed/performed                       Past Medical History:  Diagnosis Date   Anxiety    Breast cancer (HCC) 03/2018   right breast cancer at 11:00 and 1:00   Bursitis    bilateral hips and knees   Complication of anesthesia    Depression    Family history of breast cancer 03/24/2018   Family history of lung cancer 03/24/2018   History of alcohol dependence (HCC) 2007   no ETOH; resolved since 2007   History of hiatal hernia    History of psychosis 2014   due to sleep disturbance   Hypertension    Parkinson disease    Personal history of radiation therapy 06/2018-07/2018   right breast ca   PONV (postoperative nausea and vomiting)    Past Surgical History:  Procedure Laterality Date   BREAST BIOPSY Right 03/14/2018   11:00 DCIS and invasive ductal carcinoma   BREAST BIOPSY Right 03/14/2018   1:00 Invasive ductal carcinoma   BREAST LUMPECTOMY Right 04/27/2018   lumpectomy of 11 and 1:00 cancers, clear margins, negative LN   BREAST LUMPECTOMY WITH RADIOACTIVE SEED AND SENTINEL LYMPH NODE BIOPSY Right 04/27/2018   Procedure: RIGHT BREAST LUMPECTOMY WITH RADIOACTIVE SEED X 2 AND RIGHT SENTINEL LYMPH NODE BIOPSY;   Surgeon: Glenna Fellows, MD;  Location: [facility];  Service: General;  Laterality: Right;   CATARACT EXTRACTION W/PHACO Left 08/30/2019   Procedure: CATARACT EXTRACTION PHACO AND INTRAOCULAR LENS PLACEMENT (IOC) LEFT panoptix toric  01:20.5  12.7%  10.26;  Surgeon: Lockie Mola, MD;  Location: [facility];  Service: Ophthalmology;  Laterality: Left;   CATARACT EXTRACTION W/PHACO Right 09/20/2019   Procedure: CATARACT EXTRACTION PHACO AND INTRAOCULAR LENS PLACEMENT (IOC) RIGHT 5.71  00:36.4  15.7%;  Surgeon: Lockie Mola, MD;  Location: [facility];  Service: Ophthalmology;  Laterality: Right;   LAPAROTOMY N/A 02/10/2018   Procedure: EXPLORATORY LAPAROTOMY;  Surgeon: Shonna Chock, MD;  Location: [facility];  Service: Gynecology;  Laterality: N/A;   MASS EXCISION  01/2018   abdominal   OVARIAN CYST REMOVAL     SALPINGOOPHORECTOMY Bilateral 02/10/2018   Procedure: BILATERAL SALPINGO OOPHORECTOMY; PERITONEAL WASHINGS;  Surgeon: Shonna Chock, MD;  Location: [facility];  Service: Gynecology;  Laterality: Bilateral;   TONSILLECTOMY     TONSILLECTOMY AND ADENOIDECTOMY  1953   Patient Active Problem List   Diagnosis Date Noted   Prediabetes 12/15/2022   Chronic cough 08/17/2022   History of allergy to shellfish 02/24/2022   Chronic low back pain of over 3 months duration  02/04/2022   Osteoarthritis of  AC (acromioclavicular) joint (Left) 02/04/2022   Osteoarthritis of hip (Right) 02/04/2022   Impaction fracture of distal end of radius and ulna, sequela (Left) 02/04/2022   Osteoarthritis of 1st (thumb) carpometacarpal (CMC) joint of hand (Left) 02/04/2022   Lumbosacral facet arthropathy (Multilevel) (Bilateral) (T12-S1) 02/04/2022   Lumbar central spinal stenosis w/o neurogenic claudication (L2-3, L3-4, L4-5) 02/04/2022   Lumbar foraminal stenosis (Bilateral: L2-3, L4-5) (Right: (Severe) L4-5) 02/04/2022   Lumbar lateral recess stenosis (Bilateral:  L2-3, L3-4, and L4-5) (Severe) 02/04/2022   DDD (degenerative disc disease), lumbar 02/04/2022   Lumbosacral facet syndrome (Multilevel) (Bilateral) 02/04/2022   Osteoarthritis of lumbar spine 02/04/2022   Allergic conjunctivitis of both eyes 12/23/2021   Chronic hip pain (3ry area of Pain) (Bilateral) (L>R) 12/22/2021   Chronic lower extremity pain (2ry area of Pain) (Bilateral) (L>R) 12/22/2021   Abnormal MRI, lumbar spine (01/31/2020) 12/22/2021   Grade 1 Anterolisthesis of lumbar spine (L3/L4) & (5 mm) (L4/L5) 12/22/2021   Chronic pain syndrome 11/17/2021   High risk medication use 11/17/2021   Disorder of skeletal system 11/17/2021   Problems influencing health status 11/17/2021   Other hyperlipidemia 10/21/2021   Polypharmacy 08/12/2021   Chronic low back pain (1ry area of Pain) (Bilateral) (L>R) w/o sciatica 06/18/2021   Osteopenia of neck of femur (Right) 09/06/2020   Obesity 08/29/2020   Parkinson disease 02/22/2020   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   Bipolar disorder, in full remission, most recent episode depressed (HCC) 11/08/2019   Vitamin D insufficiency 08/24/2019   Insomnia due to mental condition 05/31/2019   Bipolar I disorder, most recent episode depressed (HCC) 05/31/2019   Alcohol use disorder, moderate, in sustained remission (HCC) 05/31/2019   Elevated tumor markers 05/27/2019   GAD (generalized anxiety disorder) 04/10/2019   MDD (major depressive disorder), severe (HCC) 01/31/2019   High serum carbohydrate antigen 19-9 (CA19-9) 12/01/2018   Adjustment disorder with anxious mood 11/24/2018   Essential hypertension 11/24/2018   B12 deficiency 10/17/2018   Incisional hernia, without obstruction or gangrene 09/12/2018   Genetic testing 04/07/2018   Family history of breast cancer 03/24/2018   Family history of lung cancer 03/24/2018   Malignant neoplasm of upper-outer quadrant of right breast in female, estrogen receptor positive (HCC) 03/17/2018   Malignant  neoplasm of upper-inner quadrant of right breast in female, estrogen receptor positive (HCC) 03/17/2018   History of psychosis 02/07/2018   History of insomnia 02/07/2018    ONSET DATE: Pt diagnosed with PD in 2019  REFERRING DIAG:  G20 (ICD-10-CM) - Parkinson's disease      THERAPY DIAG:  Pain in left hip  Unsteadiness on feet  Muscle weakness (generalized)  Difficulty in walking, not elsewhere classified  Dizziness and giddiness  Rationale for Evaluation and Treatment: Rehabilitation  SUBJECTIVE: Pt  reports moderate L hip pain at the moment. She reports no falls and no other updates. Pt reports sharp L hip pain felt both constant and occasionally with standing.                                                                 Pain:  L hip  Pt accompanied by: significant other self  PERTINENT HISTORY:  Pt is a pleasant 76 yo female who was diagnosed  with PD in 2019. She is known to clinic and [facility], and was last seen for LSVT BIG Sept-Oct 2022. Pt with chronic pain and has become increasingly sedentary as a result exacerbating mobility difficulties due to PD. Current concerns: Pt reports L and R hip pain and back pain. Pt was being seen by pain management clinic. Pt has had injections that provided relief at the time. Pt's spouse reports her physician determined surgery was not an option for her pain so the plan is to manage it. Pt spouse thinks pain has resulted in reluctance for pt to move. Pt currently sedentary, doesn't walk much, but when she does she holds on to furniture for support due to balance problems. When pt first stands she must hold on to something due to mix of pain and/or dizziness (described as lightheadedness). Her pain can go from 3/10 to 9/10. It does improve once she starts moving. Pt has increased difficulty with multiple activities including: getting in and out of her bed, getting in and out of her car, freezing, decreased gait speed, decreased balance and  cognition, and difficulty using BUE due to tremors.  Per chart other PMH includes hx of breast cancer, HTN, MCI, insomnia, cataracts, bipolar 1, alcohol use disorder, GAD, MDD, osteopenia of neck of R femur, chronic B low back pain with radiating pain to buttock and posterior thighs and calves. Pt with multiple comorbidities, please refer to chart for full details    PAIN: from eval Are you having pain? Yes: NPRS scale: not rated currently but can reach 9/10 Pain location: L hip and L leg Pain description: pt has difficulty describing Aggravating factors: unsure Relieving factors: laying in the recliner helps, movement  PRECAUTIONS: Fall  WEIGHT BEARING RESTRICTIONS: No  FALLS: Has patient fallen in last 6 months? Yes. Number of falls 1x, leg tangled in chair leg when getting up from a chair  PLOF: Independent with basic ADLs  PATIENT GOALS: improve mobility   BLE Strength Assessment      Right  Left  Hip Flexion 5/5 4/5 (painful)  Hip Horizontal ABDCT  5/5 5/5  Hip Horizontal ADD 4+/5 4+/5  Hip IR (seated)  4+/5 4+/5  Hip ER (seated) 5/5 5/5  Knee Extension 5/5 5/5  Knee Flexion (seated) 4+/5  5/5 (not painful)  Hip ABDCT (supine)  3+/5 4-/5   Focal popliteal edema Left knee, not painful   ROM Assessment Hip     Right  Left   Flexion Supine 132 120 (ouch)  External rotation (90/90) 30 47  Internal Rotation (90/90) 40  20  Supine Hip Extension P/ROM >0 degrees >0 degrees       IMAGING: via chart   07/24/22 DG BONE density " ASSESSMENT: The probability of a major osteoporotic fracture is 15.8% within the next ten years.   The probability of a hip fracture is 2.9% within the next ten years."   12/22/21 DG lumbar spine " IMPRESSION: 1. No acute displaced fracture or traumatic listhesis of the lumbar spine. 2. Interval development of grade 1 anterolisthesis of L3 on L4. Stable grade 1 anterolisthesis of L4 on L5. Appears stable on flexion extension views. 3.   Aortic Atherosclerosis (ICD10-I70.0)."   New from chart:  DG Lumbar spine 01/05/23: "IMPRESSION: 1. Lumbar spine scoliosis concave right. Diffuse multilevel degenerative change. Disc degeneration most prominent at L2-L3 and L5-S1. 5 mm anterolisthesis L4 on L5. Diffuse severe facet hypertrophy. No flexion or extension abnormality identified. No evidence of fracture. 2. Sclerotic  densities are noted in the L2 and L4 vertebral bodies. Although these could represent bone islands blastic metastatic disease cannot be excluded. MRI of the lumbar spine suggested for further evaluation. 3. Large amount of stool noted throughout the colon. 4. Aortoiliac atherosclerotic vascular disease."    DG hip 01/05/23: "IMPRESSION: Moderate bilateral femoroacetabular osteoarthritis, similar to prior."   PATIENT EDUCATION:  Education details: Pt educated throughout session about proper posture and technique with exercises. Improved exercise technique, movement at target joints, use of target muscles after min to mod verbal, visual, tactile cues. LSVT BIG interventions Person educated: Patient and Spouse Education method: Explanation, Demonstration, and Verbal cues Education comprehension: verbalized understanding, returned demonstration, verbal cues required, tactile cues required, and needs further education   TODAY'S TREATMENT:                                                                                                                                         DATE: 03/16/23  TA: Goal retesting completed for recertification. PT instructed pt in testing and results, indications on progress and plan. Please refer to goal section and assessment below for full details. testing deferred to future visit due to time.  PT donned heat to pt's L hip when filling out FOTO and with brief rest breaks for pain modulation. Pt skin checked prior to and upon removal of heat and is WNL with no adverse reaction to  treatment.  TE:  STS 5x   HOME EXERCISE PROGRAM:   Access Code: [email or web address] URL: [email or web address] Date: 12/22/2022 Prepared by: Temple Pacini  Exercises - Sit to Stand with Counter Support  - 1 x daily - 5 x weekly - 3 sets - 10 reps  GOALS: Goals reviewed with patient? Yes  SHORT TERM GOALS: Target date: 04/27/2023    Patient will be independent in home exercise program to improve strength/mobility for better functional independence with ADLs. Baseline: LSVT BIG protocol to be initiated next session; 12/18: not yet fully indep, reports she still gets confused about the technique; 12/03/2022: HEP to be updated; Pt reports practicing HEP 2x/week; 02/23/23: not currently performing HEP, reviewed in session and provided new copy; 03/16/2023: pt reports performing HEP every day Goal status: MET   LONG TERM GOALS: Target date: 06/08/2023     Patient will increase FOTO score to equal to or greater than 49 to demonstrate statistically significant improvement in mobility and quality of life.  Baseline: 40; 12/18: deferred due to time; 45; 3/19: deferred to next visit; 01/21/2023; 02/23/2023: 46 unchanged from previous assessment); 03/17/2023: 48 Goal status: PARTIALLY MET  2.  Patient (> 27 years old) will complete five times sit to stand test in < 15 seconds indicating decreased fall risk.  Baseline: 27 sec; 12/18 23 sec hands-free; 11/04/22: 24 sec hands free; 2/1/124: 22.7 sec use of BUE, 21.3 sec hands-free; 01/19/23: 18 seconds hands-free; 02/23/23: 19 seconds hands-free; 03/16/2023:  18 seconds hands-free Goal status: IN PROGRESS  3.  Patient will increase 10 meter walk test to >1.28m/s with WFL B step-length, upright posture for >90% of test to improve ease with gait and to override hypokentic and bradykinetic movement.  Baseline: 0.58 m/s with 1OX; 12/18: 0.7 m/s with 0RU; 11/04/22: 0.69 m/s with 0AV;  12/03/22: 0.73 m/s with 4UJ; 01/19/2023: 0.77 m/s with 8JX; 02/23/2023: 0.83 m/s  with 9JY; 03/16/2023: 0.83 m/s with 7WG , pt still somewhat crouched, requires cues for upright posture >50% of time  Goal status: IN PROGRESS  4.  Patient will reduce time to don jacket by at least 5 seconds in order to increase ease with dressing and override bradykinetic movement Baseline: 17 seconds; 12/18: 12 seconds Goal status: MET  5.  Pt will demo or report ability to open standard jar using only 1-2 attempts in order to exhibit improved functional mobility for ADLs.  Baseline: 5.7 seconds with multiple attempts to turn jar lid to open; 12/18: 2.8 sec opens in 2 turns Goal status: MET  6.  Patient will increase Berg Balance score by > 6 points to demonstrate decreased fall risk during functional activities. Baseline: to be tested next 1-2 visits; 12/03/2022: 44/56; 01/21/23 45/56; 48/56; 03/17/2023: 48/56 Goal status: In Progress  7.  Patient will report at least 4 days in a row with minimal to no L hip pain in order to improve QOL and ease/safety with functional mobility. Baseline: Pt reports pain majority of days in L hip; 12/03/2022: pt reports having hip pain about every day; 3/19: rates hip pain daily (being seen now by MD, new imaging); 02/23/2023: pt went for period of time without hip pain (she thinks a week), but reports it has now come back, Goal status: In Progress  8. Patient will increase six minute walk test distance to >1000 for progression to community ambulator and improve gait ability Baseline:02/23/2023: 722 ft with 4WW and two standing rest breaks, rates test as hard; 03/16/2023: deferred to next visit due to time Goal status: IN PROGRESS  ASSESSMENT:  CLINICAL IMPRESSION:Goal retesting initiated for recertification. Pt made modest gains on FOTO and 5xSTS, indicating improvement in perception of functional mobility, QOL, and indicates increased BLE strength with slight decrease in fall risk. While pt making gains, pt with same scores on BERG and as previous testing  on 02/23/2023. Patient's condition has the potential to improve in response to therapy. Maximum improvement is yet to be obtained. The anticipated improvement is attainable and reasonable in a generally predictable time.   The pt will benefit from further skilled PT to improve strength, balance, gait and mobility.  OBJECTIVE IMPAIRMENTS: Abnormal gait, decreased balance, decreased cognition, decreased coordination, decreased endurance, decreased knowledge of use of DME, decreased mobility, difficulty walking, decreased strength, dizziness, hypomobility, impaired UE functional use, improper body mechanics, postural dysfunction, and pain.   ACTIVITY LIMITATIONS: lifting, bending, standing, squatting, stairs, transfers, bed mobility, bathing, dressing, hygiene/grooming, and locomotion level  PARTICIPATION LIMITATIONS: meal prep, cleaning, laundry, medication management, personal finances, driving, shopping, community activity, and yard work  PERSONAL FACTORS: Age, Fitness, Sex, Time since onset of injury/illness/exacerbation, and 3+ comorbidities: PMH includes hx of breast cancer, HTN, MCI, insomnia, cataracts, bipolar 1, alcohol use disorder, GAD, MDD, osteopenia of neck of R femur, chronic B low back pain with radiating pain to buttock and posterior thighs and calves. Pt with multiple comorbidities, please refer to chart for full details  are also affecting patient's functional outcome.  REHAB POTENTIAL: Fair    CLINICAL DECISION MAKING: Evolving/moderate complexity  EVALUATION COMPLEXITY: High  PLAN:  PT FREQUENCY: 2x/week  PT DURATION: 12 weeks  PLANNED INTERVENTIONS: Therapeutic exercises, Therapeutic activity, Neuromuscular re-education, Balance training, Gait training, Patient/Family education, Self Care, Joint mobilization, Stair training, Vestibular training, Canalith repositioning, Visual/preceptual remediation/compensation, Orthotic/Fit training, DME instructions, Wheelchair mobility  training, Spinal mobilization, Cryotherapy, Moist heat, Splintting, Taping, and Manual therapy  PLAN FOR NEXT SESSION: hamtrings loading, hip strength, mobility, endurance, gait and balance   3:39 PM, 03/16/23 Temple Pacini PT, DPT  Physical Therapist - [facility]  Outpatient Physical Therapy- [facility] [phone]     Baird Kay, PT 03/16/2023, 3:39 PM

## 2023-03-18 ENCOUNTER — Ambulatory Visit: Payer: Medicare PPO

## 2023-03-18 DIAGNOSIS — M25552 Pain in left hip: Secondary | ICD-10-CM | POA: Diagnosis not present

## 2023-03-18 DIAGNOSIS — R262 Difficulty in walking, not elsewhere classified: Secondary | ICD-10-CM

## 2023-03-18 DIAGNOSIS — M6281 Muscle weakness (generalized): Secondary | ICD-10-CM

## 2023-03-18 DIAGNOSIS — R42 Dizziness and giddiness: Secondary | ICD-10-CM | POA: Diagnosis not present

## 2023-03-18 DIAGNOSIS — R278 Other lack of coordination: Secondary | ICD-10-CM | POA: Diagnosis not present

## 2023-03-18 DIAGNOSIS — R2681 Unsteadiness on feet: Secondary | ICD-10-CM | POA: Diagnosis not present

## 2023-03-18 NOTE — Therapy (Signed)
OUTPATIENT NEURO PHYSICAL THERAPY TREATMENT    Patient Name: Kendra [name] MRN: [id number] DOB:February 06, 1946, 77 y.o., female Today's Date: 03/18/2023  PCP: Erasmo Downer, MD REFERRING PROVIDER: Erasmo Downer, MD  END OF SESSION:  PT End of Session - 03/18/23 1309     Visit Number 37    Number of Visits 60    Date for PT Re-Evaluation 06/08/23    Authorization Type [employer] PPO    Authorization Time Period 11/04/2022-12/30/2022    Progress Note Due on Visit 30    PT Start Time 1308    PT Stop Time 1342    PT Time Calculation (min) 34 min    Equipment Utilized During Treatment Gait belt    Activity Tolerance Patient tolerated treatment well;Patient limited by fatigue    Behavior During Therapy WFL for tasks assessed/performed                       Past Medical History:  Diagnosis Date   Anxiety    Breast cancer (HCC) 03/2018   right breast cancer at 11:00 and 1:00   Bursitis    bilateral hips and knees   Complication of anesthesia    Depression    Family history of breast cancer 03/24/2018   Family history of lung cancer 03/24/2018   History of alcohol dependence (HCC) 2007   no ETOH; resolved since 2007   History of hiatal hernia    History of psychosis 2014   due to sleep disturbance   Hypertension    Parkinson disease    Personal history of radiation therapy 06/2018-07/2018   right breast ca   PONV (postoperative nausea and vomiting)    Past Surgical History:  Procedure Laterality Date   BREAST BIOPSY Right 03/14/2018   11:00 DCIS and invasive ductal carcinoma   BREAST BIOPSY Right 03/14/2018   1:00 Invasive ductal carcinoma   BREAST LUMPECTOMY Right 04/27/2018   lumpectomy of 11 and 1:00 cancers, clear margins, negative LN   BREAST LUMPECTOMY WITH RADIOACTIVE SEED AND SENTINEL LYMPH NODE BIOPSY Right 04/27/2018   Procedure: RIGHT BREAST LUMPECTOMY WITH RADIOACTIVE SEED X 2 AND RIGHT SENTINEL LYMPH NODE BIOPSY;  Surgeon:  Glenna Fellows, MD;  Location: [facility];  Service: General;  Laterality: Right;   CATARACT EXTRACTION W/PHACO Left 08/30/2019   Procedure: CATARACT EXTRACTION PHACO AND INTRAOCULAR LENS PLACEMENT (IOC) LEFT panoptix toric  01:20.5  12.7%  10.26;  Surgeon: Lockie Mola, MD;  Location: [facility];  Service: Ophthalmology;  Laterality: Left;   CATARACT EXTRACTION W/PHACO Right 09/20/2019   Procedure: CATARACT EXTRACTION PHACO AND INTRAOCULAR LENS PLACEMENT (IOC) RIGHT 5.71  00:36.4  15.7%;  Surgeon: Lockie Mola, MD;  Location: [facility];  Service: Ophthalmology;  Laterality: Right;   LAPAROTOMY N/A 02/10/2018   Procedure: EXPLORATORY LAPAROTOMY;  Surgeon: Shonna Chock, MD;  Location: [facility];  Service: Gynecology;  Laterality: N/A;   MASS EXCISION  01/2018   abdominal   OVARIAN CYST REMOVAL     SALPINGOOPHORECTOMY Bilateral 02/10/2018   Procedure: BILATERAL SALPINGO OOPHORECTOMY; PERITONEAL WASHINGS;  Surgeon: Shonna Chock, MD;  Location: [facility];  Service: Gynecology;  Laterality: Bilateral;   TONSILLECTOMY     TONSILLECTOMY AND ADENOIDECTOMY  1953   Patient Active Problem List   Diagnosis Date Noted   Prediabetes 12/15/2022   Chronic cough 08/17/2022   History of allergy to shellfish 02/24/2022   Chronic low back pain of over 3 months duration  02/04/2022   Osteoarthritis of  AC (acromioclavicular) joint (Left) 02/04/2022   Osteoarthritis of hip (Right) 02/04/2022   Impaction fracture of distal end of radius and ulna, sequela (Left) 02/04/2022   Osteoarthritis of 1st (thumb) carpometacarpal (CMC) joint of hand (Left) 02/04/2022   Lumbosacral facet arthropathy (Multilevel) (Bilateral) (T12-S1) 02/04/2022   Lumbar central spinal stenosis w/o neurogenic claudication (L2-3, L3-4, L4-5) 02/04/2022   Lumbar foraminal stenosis (Bilateral: L2-3, L4-5) (Right: (Severe) L4-5) 02/04/2022   Lumbar lateral recess stenosis (Bilateral: L2-3,  L3-4, and L4-5) (Severe) 02/04/2022   DDD (degenerative disc disease), lumbar 02/04/2022   Lumbosacral facet syndrome (Multilevel) (Bilateral) 02/04/2022   Osteoarthritis of lumbar spine 02/04/2022   Allergic conjunctivitis of both eyes 12/23/2021   Chronic hip pain (3ry area of Pain) (Bilateral) (L>R) 12/22/2021   Chronic lower extremity pain (2ry area of Pain) (Bilateral) (L>R) 12/22/2021   Abnormal MRI, lumbar spine (01/31/2020) 12/22/2021   Grade 1 Anterolisthesis of lumbar spine (L3/L4) & (5 mm) (L4/L5) 12/22/2021   Chronic pain syndrome 11/17/2021   High risk medication use 11/17/2021   Disorder of skeletal system 11/17/2021   Problems influencing health status 11/17/2021   Other hyperlipidemia 10/21/2021   Polypharmacy 08/12/2021   Chronic low back pain (1ry area of Pain) (Bilateral) (L>R) w/o sciatica 06/18/2021   Osteopenia of neck of femur (Right) 09/06/2020   Obesity 08/29/2020   Parkinson disease 02/22/2020   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   Bipolar disorder, in full remission, most recent episode depressed (HCC) 11/08/2019   Vitamin D insufficiency 08/24/2019   Insomnia due to mental condition 05/31/2019   Bipolar I disorder, most recent episode depressed (HCC) 05/31/2019   Alcohol use disorder, moderate, in sustained remission (HCC) 05/31/2019   Elevated tumor markers 05/27/2019   GAD (generalized anxiety disorder) 04/10/2019   MDD (major depressive disorder), severe (HCC) 01/31/2019   High serum carbohydrate antigen 19-9 (CA19-9) 12/01/2018   Adjustment disorder with anxious mood 11/24/2018   Essential hypertension 11/24/2018   B12 deficiency 10/17/2018   Incisional hernia, without obstruction or gangrene 09/12/2018   Genetic testing 04/07/2018   Family history of breast cancer 03/24/2018   Family history of lung cancer 03/24/2018   Malignant neoplasm of upper-outer quadrant of right breast in female, estrogen receptor positive (HCC) 03/17/2018   Malignant  neoplasm of upper-inner quadrant of right breast in female, estrogen receptor positive (HCC) 03/17/2018   History of psychosis 02/07/2018   History of insomnia 02/07/2018    ONSET DATE: Pt diagnosed with PD in 2019  REFERRING DIAG:  G20 (ICD-10-CM) - Parkinson's disease      THERAPY DIAG:  Difficulty in walking, not elsewhere classified  Other lack of coordination  Muscle weakness (generalized)  Rationale for Evaluation and Treatment: Rehabilitation  SUBJECTIVE: Pt  reports no pain, falls or other updates at the moment.                                                                 Pain:  no pain Pt accompanied by: significant other self  PERTINENT HISTORY:  Pt is a pleasant 77 yo female who was diagnosed with PD in 2019. She is known to clinic and [facility], and was last seen for LSVT BIG Sept-Oct 2022. Pt with chronic pain and has become increasingly  sedentary as a result exacerbating mobility difficulties due to PD. Current concerns: Pt reports L and R hip pain and back pain. Pt was being seen by pain management clinic. Pt has had injections that provided relief at the time. Pt's spouse reports her physician determined surgery was not an option for her pain so the plan is to manage it. Pt spouse thinks pain has resulted in reluctance for pt to move. Pt currently sedentary, doesn't walk much, but when she does she holds on to furniture for support due to balance problems. When pt first stands she must hold on to something due to mix of pain and/or dizziness (described as lightheadedness). Her pain can go from 3/10 to 9/10. It does improve once she starts moving. Pt has increased difficulty with multiple activities including: getting in and out of her bed, getting in and out of her car, freezing, decreased gait speed, decreased balance and cognition, and difficulty using BUE due to tremors.  Per chart other PMH includes hx of breast cancer, HTN, MCI, insomnia, cataracts, bipolar 1,  alcohol use disorder, GAD, MDD, osteopenia of neck of R femur, chronic B low back pain with radiating pain to buttock and posterior thighs and calves. Pt with multiple comorbidities, please refer to chart for full details    PAIN: from eval Are you having pain? Yes: NPRS scale: not rated currently but can reach 9/10 Pain location: L hip and L leg Pain description: pt has difficulty describing Aggravating factors: unsure Relieving factors: laying in the recliner helps, movement  PRECAUTIONS: Fall  WEIGHT BEARING RESTRICTIONS: No  FALLS: Has patient fallen in last 6 months? Yes. Number of falls 1x, leg tangled in chair leg when getting up from a chair  PLOF: Independent with basic ADLs  PATIENT GOALS: improve mobility   BLE Strength Assessment      Right  Left  Hip Flexion 5/5 4/5 (painful)  Hip Horizontal ABDCT  5/5 5/5  Hip Horizontal ADD 4+/5 4+/5  Hip IR (seated)  4+/5 4+/5  Hip ER (seated) 5/5 5/5  Knee Extension 5/5 5/5  Knee Flexion (seated) 4+/5  5/5 (not painful)  Hip ABDCT (supine)  3+/5 4-/5   Focal popliteal edema Left knee, not painful   ROM Assessment Hip     Right  Left   Flexion Supine 132 120 (ouch)  External rotation (90/90) 30 47  Internal Rotation (90/90) 40  20  Supine Hip Extension P/ROM >0 degrees >0 degrees       IMAGING: via chart   07/24/22 DG BONE density " ASSESSMENT: The probability of a major osteoporotic fracture is 15.8% within the next ten years.   The probability of a hip fracture is 2.9% within the next ten years."   12/22/21 DG lumbar spine " IMPRESSION: 1. No acute displaced fracture or traumatic listhesis of the lumbar spine. 2. Interval development of grade 1 anterolisthesis of L3 on L4. Stable grade 1 anterolisthesis of L4 on L5. Appears stable on flexion extension views. 3.  Aortic Atherosclerosis (ICD10-I70.0)."   New from chart:  DG Lumbar spine 01/05/23: "IMPRESSION: 1. Lumbar spine scoliosis concave right. Diffuse  multilevel degenerative change. Disc degeneration most prominent at L2-L3 and L5-S1. 5 mm anterolisthesis L4 on L5. Diffuse severe facet hypertrophy. No flexion or extension abnormality identified. No evidence of fracture. 2. Sclerotic densities are noted in the L2 and L4 vertebral bodies. Although these could represent bone islands blastic metastatic disease cannot be excluded. MRI of the lumbar spine suggested  for further evaluation. 3. Large amount of stool noted throughout the colon. 4. Aortoiliac atherosclerotic vascular disease."    DG hip 01/05/23: "IMPRESSION: Moderate bilateral femoroacetabular osteoarthritis, similar to prior."   PATIENT EDUCATION:  Education details: Pt educated throughout session about proper posture and technique with exercises. Improved exercise technique, movement at target joints, use of target muscles after min to mod verbal, visual, tactile cues. LSVT BIG interventions Person educated: Patient and Spouse Education method: Explanation, Demonstration, and Verbal cues Education comprehension: verbalized understanding, returned demonstration, verbal cues required, tactile cues required, and needs further education   TODAY'S TREATMENT:                                                                                                                                         DATE: 03/18/23  TE: Gait belt donned and CGA provided throughout unless specified otherwise  2.5# AW donned each LE: Ambulation for endurance with 4WW, CGA 444 ft without rest break. Improvement from previous sessions.  STS  2x8. Pt rates medium   Seated thoracic extension over chair 10x - performed to promote upright posture   NMR:  Large amplitude seated trunk twist with clap 2x10 each side. Pt reports intervention is fatiguing  With UUE support: Standing forward weight shifts with alt UE reaches 10x each side. Intervention makes pt feel mildly dizzy, requires rest  break.  Standing side step with reach 10x each side. Intervention makes pt mildly dizzy, requires rest break between sets.  Symptoms improve with seated rest  Seated large-amplitude forward reach>bend>upright with bilat shoulder abduction 2x8 with 5 sec hold/rep. Pt reports intervention is fatiguing     HOME EXERCISE PROGRAM:   Access Code: [email or web address] URL: [email or web address] Date: 12/22/2022 Prepared by: Temple Pacini  Exercises - Sit to Stand with Counter Support  - 1 x daily - 5 x weekly - 3 sets - 10 reps  GOALS: Goals reviewed with patient? Yes  SHORT TERM GOALS: Target date: 04/27/2023    Patient will be independent in home exercise program to improve strength/mobility for better functional independence with ADLs. Baseline: LSVT BIG protocol to be initiated next session; 12/18: not yet fully indep, reports she still gets confused about the technique; 12/03/2022: HEP to be updated; Pt reports practicing HEP 2x/week; 02/23/23: not currently performing HEP, reviewed in session and provided new copy; 03/16/2023: pt reports performing HEP every day Goal status: MET   LONG TERM GOALS: Target date: 06/08/2023     Patient will increase FOTO score to equal to or greater than 49 to demonstrate statistically significant improvement in mobility and quality of life.  Baseline: 40; 12/18: deferred due to time; 45; 3/19: deferred to next visit; 01/21/2023; 02/23/2023: 46 unchanged from previous assessment); 03/17/2023: 48 Goal status: PARTIALLY MET  2.  Patient (> 32 years old) will complete five times sit to stand test in < 15 seconds indicating decreased fall  risk.  Baseline: 27 sec; 12/18 23 sec hands-free; 11/04/22: 24 sec hands free; 2/1/124: 22.7 sec use of BUE, 21.3 sec hands-free; 01/19/23: 18 seconds hands-free; 02/23/23: 19 seconds hands-free; 03/16/2023: 18 seconds hands-free Goal status: IN PROGRESS  3.  Patient will increase 10 meter walk test to >1.6m/s with WFL B  step-length, upright posture for >90% of test to improve ease with gait and to override hypokentic and bradykinetic movement.  Baseline: 0.58 m/s with 1OX; 12/18: 0.7 m/s with 0RU; 11/04/22: 0.69 m/s with 0AV;  12/03/22: 0.73 m/s with 4UJ; 01/19/2023: 0.77 m/s with 8JX; 02/23/2023: 0.83 m/s with 9JY; 03/16/2023: 0.83 m/s with 7WG , pt still somewhat crouched, requires cues for upright posture >50% of time  Goal status: IN PROGRESS  4.  Patient will reduce time to don jacket by at least 5 seconds in order to increase ease with dressing and override bradykinetic movement Baseline: 17 seconds; 12/18: 12 seconds Goal status: MET  5.  Pt will demo or report ability to open standard jar using only 1-2 attempts in order to exhibit improved functional mobility for ADLs.  Baseline: 5.7 seconds with multiple attempts to turn jar lid to open; 12/18: 2.8 sec opens in 2 turns Goal status: MET  6.  Patient will increase Berg Balance score by > 6 points to demonstrate decreased fall risk during functional activities. Baseline: to be tested next 1-2 visits; 12/03/2022: 44/56; 01/21/23 45/56; 48/56; 03/17/2023: 48/56 Goal status: In Progress  7.  Patient will report at least 4 days in a row with minimal to no L hip pain in order to improve QOL and ease/safety with functional mobility. Baseline: Pt reports pain majority of days in L hip; 12/03/2022: pt reports having hip pain about every day; 3/19: rates hip pain daily (being seen now by MD, new imaging); 02/23/2023: pt went for period of time without hip pain (she thinks a week), but reports it has now come back, Goal status: In Progress  8. Patient will increase six minute walk test distance to >1000 for progression to community ambulator and improve gait ability Baseline:02/23/2023: 722 ft with 4WW and two standing rest breaks, rates test as hard; 03/16/2023: deferred to next visit due to time Goal status: IN PROGRESS  ASSESSMENT:  CLINICAL IMPRESSION: Pt shows progress  today increasing distance ambulated before requiring rest break (completing task also with 2.5# AW donned). This indicates increased activity tolerance. Other part of session focused on large-amplitude movements involving UE coordination. These tasks are still very fatiguing for pt, and pt quick to request rest breaks.  The pt will benefit from further skilled PT to improve strength, balance, gait and mobility.  OBJECTIVE IMPAIRMENTS: Abnormal gait, decreased balance, decreased cognition, decreased coordination, decreased endurance, decreased knowledge of use of DME, decreased mobility, difficulty walking, decreased strength, dizziness, hypomobility, impaired UE functional use, improper body mechanics, postural dysfunction, and pain.   ACTIVITY LIMITATIONS: lifting, bending, standing, squatting, stairs, transfers, bed mobility, bathing, dressing, hygiene/grooming, and locomotion level  PARTICIPATION LIMITATIONS: meal prep, cleaning, laundry, medication management, personal finances, driving, shopping, community activity, and yard work  PERSONAL FACTORS: Age, Fitness, Sex, Time since onset of injury/illness/exacerbation, and 3+ comorbidities: PMH includes hx of breast cancer, HTN, MCI, insomnia, cataracts, bipolar 1, alcohol use disorder, GAD, MDD, osteopenia of neck of R femur, chronic B low back pain with radiating pain to buttock and posterior thighs and calves. Pt with multiple comorbidities, please refer to chart for full details  are also affecting patient's functional outcome.  REHAB POTENTIAL: Fair    CLINICAL DECISION MAKING: Evolving/moderate complexity  EVALUATION COMPLEXITY: High  PLAN:  PT FREQUENCY: 2x/week  PT DURATION: 12 weeks  PLANNED INTERVENTIONS: Therapeutic exercises, Therapeutic activity, Neuromuscular re-education, Balance training, Gait training, Patient/Family education, Self Care, Joint mobilization, Stair training, Vestibular training, Canalith repositioning,  Visual/preceptual remediation/compensation, Orthotic/Fit training, DME instructions, Wheelchair mobility training, Spinal mobilization, Cryotherapy, Moist heat, Splintting, Taping, and Manual therapy  PLAN FOR NEXT SESSION: hamtrings loading, hip strength, mobility, endurance, gait and balance   1:46 PM, 03/18/23 Temple Pacini PT, DPT  Physical Therapist - [facility]  [facility] [phone]     Baird Kay, PT 03/18/2023, 1:46 PM

## 2023-03-23 ENCOUNTER — Ambulatory Visit: Payer: Medicare PPO

## 2023-03-23 DIAGNOSIS — M25552 Pain in left hip: Secondary | ICD-10-CM | POA: Diagnosis not present

## 2023-03-23 DIAGNOSIS — M6281 Muscle weakness (generalized): Secondary | ICD-10-CM | POA: Diagnosis not present

## 2023-03-23 DIAGNOSIS — R278 Other lack of coordination: Secondary | ICD-10-CM

## 2023-03-23 DIAGNOSIS — R262 Difficulty in walking, not elsewhere classified: Secondary | ICD-10-CM

## 2023-03-23 DIAGNOSIS — R42 Dizziness and giddiness: Secondary | ICD-10-CM | POA: Diagnosis not present

## 2023-03-23 DIAGNOSIS — R2681 Unsteadiness on feet: Secondary | ICD-10-CM | POA: Diagnosis not present

## 2023-03-23 NOTE — Therapy (Signed)
OUTPATIENT NEURO PHYSICAL THERAPY TREATMENT    Patient Name: Kendra [name] MRN: [id number] DOB:12-Nov-1945, 77 y.o., female Today's Date: 03/23/2023  PCP: Erasmo Downer, MD REFERRING PROVIDER: Erasmo Downer, MD  END OF SESSION:  PT End of Session - 03/23/23 1303     Visit Number 38    Number of Visits 60    Date for PT Re-Evaluation 06/08/23    Authorization Type [employer] PPO    Authorization Time Period 11/04/2022-12/30/2022    Progress Note Due on Visit 30    PT Start Time 1301    PT Stop Time 1344    PT Time Calculation (min) 43 min    Equipment Utilized During Treatment Gait belt    Activity Tolerance Patient tolerated treatment well;Patient limited by fatigue    Behavior During Therapy WFL for tasks assessed/performed                       Past Medical History:  Diagnosis Date   Anxiety    Breast cancer (HCC) 03/2018   right breast cancer at 11:00 and 1:00   Bursitis    bilateral hips and knees   Complication of anesthesia    Depression    Family history of breast cancer 03/24/2018   Family history of lung cancer 03/24/2018   History of alcohol dependence (HCC) 2007   no ETOH; resolved since 2007   History of hiatal hernia    History of psychosis 2014   due to sleep disturbance   Hypertension    Parkinson disease    Personal history of radiation therapy 06/2018-07/2018   right breast ca   PONV (postoperative nausea and vomiting)    Past Surgical History:  Procedure Laterality Date   BREAST BIOPSY Right 03/14/2018   11:00 DCIS and invasive ductal carcinoma   BREAST BIOPSY Right 03/14/2018   1:00 Invasive ductal carcinoma   BREAST LUMPECTOMY Right 04/27/2018   lumpectomy of 11 and 1:00 cancers, clear margins, negative LN   BREAST LUMPECTOMY WITH RADIOACTIVE SEED AND SENTINEL LYMPH NODE BIOPSY Right 04/27/2018   Procedure: RIGHT BREAST LUMPECTOMY WITH RADIOACTIVE SEED X 2 AND RIGHT SENTINEL LYMPH NODE BIOPSY;  Surgeon:  Glenna Fellows, MD;  Location: [facility];  Service: General;  Laterality: Right;   CATARACT EXTRACTION W/PHACO Left 08/30/2019   Procedure: CATARACT EXTRACTION PHACO AND INTRAOCULAR LENS PLACEMENT (IOC) LEFT panoptix toric  01:20.5  12.7%  10.26;  Surgeon: Lockie Mola, MD;  Location: [facility];  Service: Ophthalmology;  Laterality: Left;   CATARACT EXTRACTION W/PHACO Right 09/20/2019   Procedure: CATARACT EXTRACTION PHACO AND INTRAOCULAR LENS PLACEMENT (IOC) RIGHT 5.71  00:36.4  15.7%;  Surgeon: Lockie Mola, MD;  Location: [facility];  Service: Ophthalmology;  Laterality: Right;   LAPAROTOMY N/A 02/10/2018   Procedure: EXPLORATORY LAPAROTOMY;  Surgeon: Shonna Chock, MD;  Location: [facility];  Service: Gynecology;  Laterality: N/A;   MASS EXCISION  01/2018   abdominal   OVARIAN CYST REMOVAL     SALPINGOOPHORECTOMY Bilateral 02/10/2018   Procedure: BILATERAL SALPINGO OOPHORECTOMY; PERITONEAL WASHINGS;  Surgeon: Shonna Chock, MD;  Location: [facility];  Service: Gynecology;  Laterality: Bilateral;   TONSILLECTOMY     TONSILLECTOMY AND ADENOIDECTOMY  1953   Patient Active Problem List   Diagnosis Date Noted   Prediabetes 12/15/2022   Chronic cough 08/17/2022   History of allergy to shellfish 02/24/2022   Chronic low back pain of over 3 months duration  02/04/2022   Osteoarthritis of  AC (acromioclavicular) joint (Left) 02/04/2022   Osteoarthritis of hip (Right) 02/04/2022   Impaction fracture of distal end of radius and ulna, sequela (Left) 02/04/2022   Osteoarthritis of 1st (thumb) carpometacarpal (CMC) joint of hand (Left) 02/04/2022   Lumbosacral facet arthropathy (Multilevel) (Bilateral) (T12-S1) 02/04/2022   Lumbar central spinal stenosis w/o neurogenic claudication (L2-3, L3-4, L4-5) 02/04/2022   Lumbar foraminal stenosis (Bilateral: L2-3, L4-5) (Right: (Severe) L4-5) 02/04/2022   Lumbar lateral recess stenosis (Bilateral: L2-3,  L3-4, and L4-5) (Severe) 02/04/2022   DDD (degenerative disc disease), lumbar 02/04/2022   Lumbosacral facet syndrome (Multilevel) (Bilateral) 02/04/2022   Osteoarthritis of lumbar spine 02/04/2022   Allergic conjunctivitis of both eyes 12/23/2021   Chronic hip pain (3ry area of Pain) (Bilateral) (L>R) 12/22/2021   Chronic lower extremity pain (2ry area of Pain) (Bilateral) (L>R) 12/22/2021   Abnormal MRI, lumbar spine (01/31/2020) 12/22/2021   Grade 1 Anterolisthesis of lumbar spine (L3/L4) & (5 mm) (L4/L5) 12/22/2021   Chronic pain syndrome 11/17/2021   High risk medication use 11/17/2021   Disorder of skeletal system 11/17/2021   Problems influencing health status 11/17/2021   Other hyperlipidemia 10/21/2021   Polypharmacy 08/12/2021   Chronic low back pain (1ry area of Pain) (Bilateral) (L>R) w/o sciatica 06/18/2021   Osteopenia of neck of femur (Right) 09/06/2020   Obesity 08/29/2020   Parkinson disease 02/22/2020   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   Bipolar disorder, in full remission, most recent episode depressed (HCC) 11/08/2019   Vitamin D insufficiency 08/24/2019   Insomnia due to mental condition 05/31/2019   Bipolar I disorder, most recent episode depressed (HCC) 05/31/2019   Alcohol use disorder, moderate, in sustained remission (HCC) 05/31/2019   Elevated tumor markers 05/27/2019   GAD (generalized anxiety disorder) 04/10/2019   MDD (major depressive disorder), severe (HCC) 01/31/2019   High serum carbohydrate antigen 19-9 (CA19-9) 12/01/2018   Adjustment disorder with anxious mood 11/24/2018   Essential hypertension 11/24/2018   B12 deficiency 10/17/2018   Incisional hernia, without obstruction or gangrene 09/12/2018   Genetic testing 04/07/2018   Family history of breast cancer 03/24/2018   Family history of lung cancer 03/24/2018   Malignant neoplasm of upper-outer quadrant of right breast in female, estrogen receptor positive (HCC) 03/17/2018   Malignant  neoplasm of upper-inner quadrant of right breast in female, estrogen receptor positive (HCC) 03/17/2018   History of psychosis 02/07/2018   History of insomnia 02/07/2018    ONSET DATE: Pt diagnosed with PD in 2019  REFERRING DIAG:  G20 (ICD-10-CM) - Parkinson's disease      THERAPY DIAG:  Muscle weakness (generalized)  Pain in left hip  Difficulty in walking, not elsewhere classified  Other lack of coordination  Dizziness and giddiness  Rationale for Evaluation and Treatment: Rehabilitation  SUBJECTIVE:Pt reports no updates, no stumbles/falls.                                                                 Pain: L hip pain Pt accompanied by: significant other self  PERTINENT HISTORY:  Pt is a pleasant 77 yo female who was diagnosed with PD in 2019. She is known to clinic and [facility], and was last seen for LSVT BIG Sept-Oct 2022. Pt with chronic pain and has  become increasingly sedentary as a result exacerbating mobility difficulties due to PD. Current concerns: Pt reports L and R hip pain and back pain. Pt was being seen by pain management clinic. Pt has had injections that provided relief at the time. Pt's spouse reports her physician determined surgery was not an option for her pain so the plan is to manage it. Pt spouse thinks pain has resulted in reluctance for pt to move. Pt currently sedentary, doesn't walk much, but when she does she holds on to furniture for support due to balance problems. When pt first stands she must hold on to something due to mix of pain and/or dizziness (described as lightheadedness). Her pain can go from 3/10 to 9/10. It does improve once she starts moving. Pt has increased difficulty with multiple activities including: getting in and out of her bed, getting in and out of her car, freezing, decreased gait speed, decreased balance and cognition, and difficulty using BUE due to tremors.  Per chart other PMH includes hx of breast cancer, HTN, MCI,  insomnia, cataracts, bipolar 1, alcohol use disorder, GAD, MDD, osteopenia of neck of R femur, chronic B low back pain with radiating pain to buttock and posterior thighs and calves. Pt with multiple comorbidities, please refer to chart for full details    PAIN: from eval Are you having pain? Yes: NPRS scale: not rated currently but can reach 9/10 Pain location: L hip and L leg Pain description: pt has difficulty describing Aggravating factors: unsure Relieving factors: laying in the recliner helps, movement  PRECAUTIONS: Fall  WEIGHT BEARING RESTRICTIONS: No  FALLS: Has patient fallen in last 6 months? Yes. Number of falls 1x, leg tangled in chair leg when getting up from a chair  PLOF: Independent with basic ADLs  PATIENT GOALS: improve mobility   BLE Strength Assessment      Right  Left  Hip Flexion 5/5 4/5 (painful)  Hip Horizontal ABDCT  5/5 5/5  Hip Horizontal ADD 4+/5 4+/5  Hip IR (seated)  4+/5 4+/5  Hip ER (seated) 5/5 5/5  Knee Extension 5/5 5/5  Knee Flexion (seated) 4+/5  5/5 (not painful)  Hip ABDCT (supine)  3+/5 4-/5   Focal popliteal edema Left knee, not painful   ROM Assessment Hip     Right  Left   Flexion Supine 132 120 (ouch)  External rotation (90/90) 30 47  Internal Rotation (90/90) 40  20  Supine Hip Extension P/ROM >0 degrees >0 degrees       IMAGING: via chart   07/24/22 DG BONE density " ASSESSMENT: The probability of a major osteoporotic fracture is 15.8% within the next ten years.   The probability of a hip fracture is 2.9% within the next ten years."   12/22/21 DG lumbar spine " IMPRESSION: 1. No acute displaced fracture or traumatic listhesis of the lumbar spine. 2. Interval development of grade 1 anterolisthesis of L3 on L4. Stable grade 1 anterolisthesis of L4 on L5. Appears stable on flexion extension views. 3.  Aortic Atherosclerosis (ICD10-I70.0)."   New from chart:  DG Lumbar spine 01/05/23: "IMPRESSION: 1. Lumbar spine  scoliosis concave right. Diffuse multilevel degenerative change. Disc degeneration most prominent at L2-L3 and L5-S1. 5 mm anterolisthesis L4 on L5. Diffuse severe facet hypertrophy. No flexion or extension abnormality identified. No evidence of fracture. 2. Sclerotic densities are noted in the L2 and L4 vertebral bodies. Although these could represent bone islands blastic metastatic disease cannot be excluded. MRI of the lumbar  spine suggested for further evaluation. 3. Large amount of stool noted throughout the colon. 4. Aortoiliac atherosclerotic vascular disease."    DG hip 01/05/23: "IMPRESSION: Moderate bilateral femoroacetabular osteoarthritis, similar to prior."   PATIENT EDUCATION:  Education details: Pt educated throughout session about proper posture and technique with exercises. Improved exercise technique, movement at target joints, use of target muscles after min to mod verbal, visual, tactile cues. LSVT BIG interventions Person educated: Patient and Spouse Education method: Explanation, Demonstration, and Verbal cues Education comprehension: verbalized understanding, returned demonstration, verbal cues required, tactile cues required, and needs further education   TODAY'S TREATMENT:                                                                                                                                         DATE: 03/23/23  TE: Gait belt donned and CGA provided throughout unless specified otherwise  2.5# AW donned each LE: 2 rounds - one standing rest break between rounds Ambulation for endurance with 4WW, CGA 444 ft without rest break. Improvement from previous sessions. Rest breaks between rounds.  Seated thoracic extension over chair 10x - performed to promote upright posture  STS  2x10 with bilat UE abduction. Pt rates medium    NMR:  Standing NBOS 2x30 sec  Standing tandem 2x30 sec each LE. Rest breka due to dizziness Alt LE tap onto 6" step (SLB  progression) 2 rounds x multiple reps each LE One foot on floor, one foot on 6" step stance leg LLE only 2x30 sec. Unable to complete with LLE lifted onto step, is too pain-limited.    HOME EXERCISE PROGRAM:   Access Code: [email or web address] URL: [email or web address] Date: 12/22/2022 Prepared by: Temple Pacini  Exercises - Sit to Stand with Counter Support  - 1 x daily - 5 x weekly - 3 sets - 10 reps  GOALS: Goals reviewed with patient? Yes  SHORT TERM GOALS: Target date: 04/27/2023    Patient will be independent in home exercise program to improve strength/mobility for better functional independence with ADLs. Baseline: LSVT BIG protocol to be initiated next session; 12/18: not yet fully indep, reports she still gets confused about the technique; 12/03/2022: HEP to be updated; Pt reports practicing HEP 2x/week; 02/23/23: not currently performing HEP, reviewed in session and provided new copy; 03/16/2023: pt reports performing HEP every day Goal status: MET   LONG TERM GOALS: Target date: 06/08/2023     Patient will increase FOTO score to equal to or greater than 49 to demonstrate statistically significant improvement in mobility and quality of life.  Baseline: 40; 12/18: deferred due to time; 45; 3/19: deferred to next visit; 01/21/2023; 02/23/2023: 46 unchanged from previous assessment); 03/17/2023: 48 Goal status: PARTIALLY MET  2.  Patient (> 44 years old) will complete five times sit to stand test in < 15 seconds indicating decreased fall risk.  Baseline: 27 sec; 12/18 23 sec  hands-free; 11/04/22: 24 sec hands free; 2/1/124: 22.7 sec use of BUE, 21.3 sec hands-free; 01/19/23: 18 seconds hands-free; 02/23/23: 19 seconds hands-free; 03/16/2023: 18 seconds hands-free Goal status: IN PROGRESS  3.  Patient will increase 10 meter walk test to >1.46m/s with WFL B step-length, upright posture for >90% of test to improve ease with gait and to override hypokentic and bradykinetic movement.   Baseline: 0.58 m/s with 1OX; 12/18: 0.7 m/s with 0RU; 11/04/22: 0.69 m/s with 0AV;  12/03/22: 0.73 m/s with 4UJ; 01/19/2023: 0.77 m/s with 8JX; 02/23/2023: 0.83 m/s with 9JY; 03/16/2023: 0.83 m/s with 7WG , pt still somewhat crouched, requires cues for upright posture >50% of time  Goal status: IN PROGRESS  4.  Patient will reduce time to don jacket by at least 5 seconds in order to increase ease with dressing and override bradykinetic movement Baseline: 17 seconds; 12/18: 12 seconds Goal status: MET  5.  Pt will demo or report ability to open standard jar using only 1-2 attempts in order to exhibit improved functional mobility for ADLs.  Baseline: 5.7 seconds with multiple attempts to turn jar lid to open; 12/18: 2.8 sec opens in 2 turns Goal status: MET  6.  Patient will increase Berg Balance score by > 6 points to demonstrate decreased fall risk during functional activities. Baseline: to be tested next 1-2 visits; 12/03/2022: 44/56; 01/21/23 45/56; 48/56; 03/17/2023: 48/56 Goal status: In Progress  7.  Patient will report at least 4 days in a row with minimal to no L hip pain in order to improve QOL and ease/safety with functional mobility. Baseline: Pt reports pain majority of days in L hip; 12/03/2022: pt reports having hip pain about every day; 3/19: rates hip pain daily (being seen now by MD, new imaging); 02/23/2023: pt went for period of time without hip pain (she thinks a week), but reports it has now come back, Goal status: In Progress  8. Patient will increase six minute walk test distance to >1000 for progression to community ambulator and improve gait ability Baseline:02/23/2023: 722 ft with 4WW and two standing rest breaks, rates test as hard; 03/16/2023: deferred to next visit due to time Goal status: IN PROGRESS  ASSESSMENT:  CLINICAL IMPRESSION: Pt able to sustain increased distance ambulated with gait, indicating pt has sustained gains in activity tolerance. However, pt continues to  require cuing for safe technique/proximity to RW with limited carryover between sessionsThe pt will benefit from further skilled PT to improve strength, balance, gait and mobility.  OBJECTIVE IMPAIRMENTS: Abnormal gait, decreased balance, decreased cognition, decreased coordination, decreased endurance, decreased knowledge of use of DME, decreased mobility, difficulty walking, decreased strength, dizziness, hypomobility, impaired UE functional use, improper body mechanics, postural dysfunction, and pain.   ACTIVITY LIMITATIONS: lifting, bending, standing, squatting, stairs, transfers, bed mobility, bathing, dressing, hygiene/grooming, and locomotion level  PARTICIPATION LIMITATIONS: meal prep, cleaning, laundry, medication management, personal finances, driving, shopping, community activity, and yard work  PERSONAL FACTORS: Age, Fitness, Sex, Time since onset of injury/illness/exacerbation, and 3+ comorbidities: PMH includes hx of breast cancer, HTN, MCI, insomnia, cataracts, bipolar 1, alcohol use disorder, GAD, MDD, osteopenia of neck of R femur, chronic B low back pain with radiating pain to buttock and posterior thighs and calves. Pt with multiple comorbidities, please refer to chart for full details  are also affecting patient's functional outcome.   REHAB POTENTIAL: Fair    CLINICAL DECISION MAKING: Evolving/moderate complexity  EVALUATION COMPLEXITY: High  PLAN:  PT FREQUENCY: 2x/week  PT DURATION:  12 weeks  PLANNED INTERVENTIONS: Therapeutic exercises, Therapeutic activity, Neuromuscular re-education, Balance training, Gait training, Patient/Family education, Self Care, Joint mobilization, Stair training, Vestibular training, Canalith repositioning, Visual/preceptual remediation/compensation, Orthotic/Fit training, DME instructions, Wheelchair mobility training, Spinal mobilization, Cryotherapy, Moist heat, Splintting, Taping, and Manual therapy  PLAN FOR NEXT SESSION: hamtrings  loading, hip strength, mobility, endurance, gait and balance   1:49 PM, 03/23/23 Temple Pacini PT, DPT  Physical Therapist - [facility]  [facility] [phone]     Baird Kay, PT 03/23/2023, 1:49 PM

## 2023-03-25 ENCOUNTER — Ambulatory Visit: Payer: Medicare PPO

## 2023-03-25 DIAGNOSIS — R262 Difficulty in walking, not elsewhere classified: Secondary | ICD-10-CM | POA: Diagnosis not present

## 2023-03-25 DIAGNOSIS — R278 Other lack of coordination: Secondary | ICD-10-CM | POA: Diagnosis not present

## 2023-03-25 DIAGNOSIS — M6281 Muscle weakness (generalized): Secondary | ICD-10-CM

## 2023-03-25 DIAGNOSIS — M25552 Pain in left hip: Secondary | ICD-10-CM

## 2023-03-25 DIAGNOSIS — R2681 Unsteadiness on feet: Secondary | ICD-10-CM | POA: Diagnosis not present

## 2023-03-25 DIAGNOSIS — R42 Dizziness and giddiness: Secondary | ICD-10-CM | POA: Diagnosis not present

## 2023-03-25 NOTE — Therapy (Signed)
OUTPATIENT NEURO PHYSICAL THERAPY TREATMENT    Patient Name: Kendra [name] MRN: [id number] DOB:1946-01-09, 77 y.o., female Today's Date: 03/25/2023  PCP: Erasmo Downer, MD REFERRING PROVIDER: Erasmo Downer, MD  END OF SESSION:  PT End of Session - 03/25/23 1257     Visit Number 39    Number of Visits 60    Date for PT Re-Evaluation 06/08/23    Authorization Type [employer] PPO    Authorization Time Period 11/04/2022-12/30/2022    Progress Note Due on Visit 30    PT Start Time 1300    PT Stop Time 1341    PT Time Calculation (min) 41 min    Equipment Utilized During Treatment Gait belt    Activity Tolerance Patient tolerated treatment well;Patient limited by fatigue    Behavior During Therapy [facility] for tasks assessed/performed                       Past Medical History:  Diagnosis Date   Anxiety    Breast cancer (HCC) 03/2018   right breast cancer at 11:00 and 1:00   Bursitis    bilateral hips and knees   Complication of anesthesia    Depression    Family history of breast cancer 03/24/2018   Family history of lung cancer 03/24/2018   History of alcohol dependence (HCC) 2007   no ETOH; resolved since 2007   History of hiatal hernia    History of psychosis 2014   due to sleep disturbance   Hypertension    Parkinson disease    Personal history of radiation therapy 06/2018-07/2018   right breast ca   PONV (postoperative nausea and vomiting)    Past Surgical History:  Procedure Laterality Date   BREAST BIOPSY Right 03/14/2018   11:00 DCIS and invasive ductal carcinoma   BREAST BIOPSY Right 03/14/2018   1:00 Invasive ductal carcinoma   BREAST LUMPECTOMY Right 04/27/2018   lumpectomy of 11 and 1:00 cancers, clear margins, negative LN   BREAST LUMPECTOMY WITH RADIOACTIVE SEED AND SENTINEL LYMPH NODE BIOPSY Right 04/27/2018   Procedure: RIGHT BREAST LUMPECTOMY WITH RADIOACTIVE SEED X 2 AND RIGHT SENTINEL LYMPH NODE BIOPSY;  Surgeon:  Glenna Fellows, MD;  Location: [facility];  Service: General;  Laterality: Right;   CATARACT EXTRACTION W/PHACO Left 08/30/2019   Procedure: CATARACT EXTRACTION PHACO AND INTRAOCULAR LENS PLACEMENT (IOC) LEFT panoptix toric  01:20.5  12.7%  10.26;  Surgeon: Lockie Mola, MD;  Location: [facility];  Service: Ophthalmology;  Laterality: Left;   CATARACT EXTRACTION W/PHACO Right 09/20/2019   Procedure: CATARACT EXTRACTION PHACO AND INTRAOCULAR LENS PLACEMENT (IOC) RIGHT 5.71  00:36.4  15.7%;  Surgeon: Lockie Mola, MD;  Location: [facility];  Service: Ophthalmology;  Laterality: Right;   LAPAROTOMY N/A 02/10/2018   Procedure: EXPLORATORY LAPAROTOMY;  Surgeon: Shonna Chock, MD;  Location: [facility];  Service: Gynecology;  Laterality: N/A;   MASS EXCISION  01/2018   abdominal   OVARIAN CYST REMOVAL     SALPINGOOPHORECTOMY Bilateral 02/10/2018   Procedure: BILATERAL SALPINGO OOPHORECTOMY; PERITONEAL WASHINGS;  Surgeon: Shonna Chock, MD;  Location: [facility];  Service: Gynecology;  Laterality: Bilateral;   TONSILLECTOMY     TONSILLECTOMY AND ADENOIDECTOMY  1953   Patient Active Problem List   Diagnosis Date Noted   Prediabetes 12/15/2022   Chronic cough 08/17/2022   History of allergy to shellfish 02/24/2022   Chronic low back pain of over 3 months duration  02/04/2022   Osteoarthritis of  AC (acromioclavicular) joint (Left) 02/04/2022   Osteoarthritis of hip (Right) 02/04/2022   Impaction fracture of distal end of radius and ulna, sequela (Left) 02/04/2022   Osteoarthritis of 1st (thumb) carpometacarpal (CMC) joint of hand (Left) 02/04/2022   Lumbosacral facet arthropathy (Multilevel) (Bilateral) (T12-S1) 02/04/2022   Lumbar central spinal stenosis w/o neurogenic claudication (L2-3, L3-4, L4-5) 02/04/2022   Lumbar foraminal stenosis (Bilateral: L2-3, L4-5) (Right: (Severe) L4-5) 02/04/2022   Lumbar lateral recess stenosis (Bilateral: L2-3,  L3-4, and L4-5) (Severe) 02/04/2022   DDD (degenerative disc disease), lumbar 02/04/2022   Lumbosacral facet syndrome (Multilevel) (Bilateral) 02/04/2022   Osteoarthritis of lumbar spine 02/04/2022   Allergic conjunctivitis of both eyes 12/23/2021   Chronic hip pain (3ry area of Pain) (Bilateral) (L>R) 12/22/2021   Chronic lower extremity pain (2ry area of Pain) (Bilateral) (L>R) 12/22/2021   Abnormal MRI, lumbar spine (01/31/2020) 12/22/2021   Grade 1 Anterolisthesis of lumbar spine (L3/L4) & (5 mm) (L4/L5) 12/22/2021   Chronic pain syndrome 11/17/2021   High risk medication use 11/17/2021   Disorder of skeletal system 11/17/2021   Problems influencing health status 11/17/2021   Other hyperlipidemia 10/21/2021   Polypharmacy 08/12/2021   Chronic low back pain (1ry area of Pain) (Bilateral) (L>R) w/o sciatica 06/18/2021   Osteopenia of neck of femur (Right) 09/06/2020   Obesity 08/29/2020   Parkinson disease 02/22/2020   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   Bipolar disorder, in full remission, most recent episode depressed (HCC) 11/08/2019   Vitamin D insufficiency 08/24/2019   Insomnia due to mental condition 05/31/2019   Bipolar I disorder, most recent episode depressed (HCC) 05/31/2019   Alcohol use disorder, moderate, in sustained remission (HCC) 05/31/2019   Elevated tumor markers 05/27/2019   GAD (generalized anxiety disorder) 04/10/2019   MDD (major depressive disorder), severe (HCC) 01/31/2019   High serum carbohydrate antigen 19-9 (CA19-9) 12/01/2018   Adjustment disorder with anxious mood 11/24/2018   Essential hypertension 11/24/2018   B12 deficiency 10/17/2018   Incisional hernia, without obstruction or gangrene 09/12/2018   Genetic testing 04/07/2018   Family history of breast cancer 03/24/2018   Family history of lung cancer 03/24/2018   Malignant neoplasm of upper-outer quadrant of right breast in female, estrogen receptor positive (HCC) 03/17/2018   Malignant  neoplasm of upper-inner quadrant of right breast in female, estrogen receptor positive (HCC) 03/17/2018   History of psychosis 02/07/2018   History of insomnia 02/07/2018    ONSET DATE: Pt diagnosed with PD in 2019  REFERRING DIAG:  G20 (ICD-10-CM) - Parkinson's disease      THERAPY DIAG:  Muscle weakness (generalized)  Difficulty in walking, not elsewhere classified  Unsteadiness on feet  Pain in left hip  Dizziness and giddiness  Rationale for Evaluation and Treatment: Rehabilitation  SUBJECTIVE: Pt reports L hip pain currently, and reports on stumbles/falls.                                                                 Pain: L hip pain Pt accompanied by: significant other self  PERTINENT HISTORY:  Pt is a pleasant 77 yo female who was diagnosed with PD in 2019. She is known to clinic and [facility], and was last seen for LSVT BIG Sept-Oct 2022. Pt with  chronic pain and has become increasingly sedentary as a result exacerbating mobility difficulties due to PD. Current concerns: Pt reports L and R hip pain and back pain. Pt was being seen by pain management clinic. Pt has had injections that provided relief at the time. Pt's spouse reports her physician determined surgery was not an option for her pain so the plan is to manage it. Pt spouse thinks pain has resulted in reluctance for pt to move. Pt currently sedentary, doesn't walk much, but when she does she holds on to furniture for support due to balance problems. When pt first stands she must hold on to something due to mix of pain and/or dizziness (described as lightheadedness). Her pain can go from 3/10 to 9/10. It does improve once she starts moving. Pt has increased difficulty with multiple activities including: getting in and out of her bed, getting in and out of her car, freezing, decreased gait speed, decreased balance and cognition, and difficulty using BUE due to tremors.  Per chart other PMH includes hx of breast cancer,  HTN, MCI, insomnia, cataracts, bipolar 1, alcohol use disorder, GAD, MDD, osteopenia of neck of R femur, chronic B low back pain with radiating pain to buttock and posterior thighs and calves. Pt with multiple comorbidities, please refer to chart for full details    PAIN: from eval Are you having pain? Yes: NPRS scale: not rated currently but can reach 9/10 Pain location: L hip and L leg Pain description: pt has difficulty describing Aggravating factors: unsure Relieving factors: laying in the recliner helps, movement  PRECAUTIONS: Fall  WEIGHT BEARING RESTRICTIONS: No  FALLS: Has patient fallen in last 6 months? Yes. Number of falls 1x, leg tangled in chair leg when getting up from a chair  PLOF: Independent with basic ADLs  PATIENT GOALS: improve mobility   BLE Strength Assessment      Right  Left  Hip Flexion 5/5 4/5 (painful)  Hip Horizontal ABDCT  5/5 5/5  Hip Horizontal ADD 4+/5 4+/5  Hip IR (seated)  4+/5 4+/5  Hip ER (seated) 5/5 5/5  Knee Extension 5/5 5/5  Knee Flexion (seated) 4+/5  5/5 (not painful)  Hip ABDCT (supine)  3+/5 4-/5   Focal popliteal edema Left knee, not painful   ROM Assessment Hip     Right  Left   Flexion Supine 132 120 (ouch)  External rotation (90/90) 30 47  Internal Rotation (90/90) 40  20  Supine Hip Extension P/ROM >0 degrees >0 degrees       IMAGING: via chart   07/24/22 DG BONE density " ASSESSMENT: The probability of a major osteoporotic fracture is 15.8% within the next ten years.   The probability of a hip fracture is 2.9% within the next ten years."   12/22/21 DG lumbar spine " IMPRESSION: 1. No acute displaced fracture or traumatic listhesis of the lumbar spine. 2. Interval development of grade 1 anterolisthesis of L3 on L4. Stable grade 1 anterolisthesis of L4 on L5. Appears stable on flexion extension views. 3.  Aortic Atherosclerosis (ICD10-I70.0)."   New from chart:  DG Lumbar spine 01/05/23: "IMPRESSION: 1.  Lumbar spine scoliosis concave right. Diffuse multilevel degenerative change. Disc degeneration most prominent at L2-L3 and L5-S1. 5 mm anterolisthesis L4 on L5. Diffuse severe facet hypertrophy. No flexion or extension abnormality identified. No evidence of fracture. 2. Sclerotic densities are noted in the L2 and L4 vertebral bodies. Although these could represent bone islands blastic metastatic disease cannot be excluded.  MRI of the lumbar spine suggested for further evaluation. 3. Large amount of stool noted throughout the colon. 4. Aortoiliac atherosclerotic vascular disease."    DG hip 01/05/23: "IMPRESSION: Moderate bilateral femoroacetabular osteoarthritis, similar to prior."   PATIENT EDUCATION:  Education details: Pt educated throughout session about proper posture and technique with exercises. Improved exercise technique, movement at target joints, use of target muscles after min to mod verbal, visual, tactile cues. LSVT BIG interventions Person educated: Patient and Spouse Education method: Explanation, Demonstration, and Verbal cues Education comprehension: verbalized understanding, returned demonstration, verbal cues required, tactile cues required, and needs further education   TODAY'S TREATMENT:                                                                                                                                         DATE: 03/25/23  TE: Gait belt donned and CGA provided throughout unless specified otherwise  3# AW donned each LE: 2 rounds - one standing rest break with first set Ambulation for endurance with 4WW, CGA 444 ft. Improvement from previous sessions. Rest breaks between rounds.  STS  1x10. Pt rates medium --pt then cued to perform with increased speed for remaining 2x10. Pt fatigues, requires rest break between sets   3# AW donned, pt seated: LAQ 2x15 each LE. Pt rates medium March 2x15 each LE   NMR:  Standing next to support  surface: WBOS EC 30 sec NBOS EC 2x30 sec Standing trunk twists 10x each way. Pt rates medium-hard. Does not exhibit LOB, but reports difficulty is due to motion making her feel dizzy. She reports her dizziness improves once she sits down.   HOME EXERCISE PROGRAM:   Access Code: [email or web address] URL: [email or web address] Date: 12/22/2022 Prepared by: Temple Pacini  Exercises - Sit to Stand with Counter Support  - 1 x daily - 5 x weekly - 3 sets - 10 reps  GOALS: Goals reviewed with patient? Yes  SHORT TERM GOALS: Target date: 04/27/2023    Patient will be independent in home exercise program to improve strength/mobility for better functional independence with ADLs. Baseline: LSVT BIG protocol to be initiated next session; 12/18: not yet fully indep, reports she still gets confused about the technique; 12/03/2022: HEP to be updated; Pt reports practicing HEP 2x/week; 02/23/23: not currently performing HEP, reviewed in session and provided new copy; 03/16/2023: pt reports performing HEP every day Goal status: MET   LONG TERM GOALS: Target date: 06/08/2023     Patient will increase FOTO score to equal to or greater than 49 to demonstrate statistically significant improvement in mobility and quality of life.  Baseline: 40; 12/18: deferred due to time; 45; 3/19: deferred to next visit; 01/21/2023; 02/23/2023: 46 unchanged from previous assessment); 03/17/2023: 48 Goal status: PARTIALLY MET  2.  Patient (> 36 years old) will complete five times sit to stand test in < 15 seconds indicating decreased fall  risk.  Baseline: 27 sec; 12/18 23 sec hands-free; 11/04/22: 24 sec hands free; 2/1/124: 22.7 sec use of BUE, 21.3 sec hands-free; 01/19/23: 18 seconds hands-free; 02/23/23: 19 seconds hands-free; 03/16/2023: 18 seconds hands-free Goal status: IN PROGRESS  3.  Patient will increase 10 meter walk test to >1.71m/s with WFL B step-length, upright posture for >90% of test to improve ease with gait  and to override hypokentic and bradykinetic movement.  Baseline: 0.58 m/s with 1OX; 12/18: 0.7 m/s with 0RU; 11/04/22: 0.69 m/s with 0AV;  12/03/22: 0.73 m/s with 4UJ; 01/19/2023: 0.77 m/s with 8JX; 02/23/2023: 0.83 m/s with 9JY; 03/16/2023: 0.83 m/s with 7WG , pt still somewhat crouched, requires cues for upright posture >50% of time  Goal status: IN PROGRESS  4.  Patient will reduce time to don jacket by at least 5 seconds in order to increase ease with dressing and override bradykinetic movement Baseline: 17 seconds; 12/18: 12 seconds Goal status: MET  5.  Pt will demo or report ability to open standard jar using only 1-2 attempts in order to exhibit improved functional mobility for ADLs.  Baseline: 5.7 seconds with multiple attempts to turn jar lid to open; 12/18: 2.8 sec opens in 2 turns Goal status: MET  6.  Patient will increase Berg Balance score by > 6 points to demonstrate decreased fall risk during functional activities. Baseline: to be tested next 1-2 visits; 12/03/2022: 44/56; 01/21/23 45/56; 48/56; 03/17/2023: 48/56 Goal status: In Progress  7.  Patient will report at least 4 days in a row with minimal to no L hip pain in order to improve QOL and ease/safety with functional mobility. Baseline: Pt reports pain majority of days in L hip; 12/03/2022: pt reports having hip pain about every day; 3/19: rates hip pain daily (being seen now by MD, new imaging); 02/23/2023: pt went for period of time without hip pain (she thinks a week), but reports it has now come back, Goal status: In Progress  8. Patient will increase six minute walk test distance to >1000 for progression to community ambulator and improve gait ability Baseline:02/23/2023: 722 ft with 4WW and two standing rest breaks, rates test as hard; 03/16/2023: deferred to next visit due to time Goal status: IN PROGRESS  ASSESSMENT:  CLINICAL IMPRESSION: Pt able to maintain increased ambulation distance with heavier ankle weights donned  today, requiring only 1 brief standing break during this activity. Pt was somewhat limited due to dizziness symptoms with balance interventions. These symptoms are chronic and improve quickly with rest. The pt will benefit from further skilled PT to improve strength, balance, gait and mobility.  OBJECTIVE IMPAIRMENTS: Abnormal gait, decreased balance, decreased cognition, decreased coordination, decreased endurance, decreased knowledge of use of DME, decreased mobility, difficulty walking, decreased strength, dizziness, hypomobility, impaired UE functional use, improper body mechanics, postural dysfunction, and pain.   ACTIVITY LIMITATIONS: lifting, bending, standing, squatting, stairs, transfers, bed mobility, bathing, dressing, hygiene/grooming, and locomotion level  PARTICIPATION LIMITATIONS: meal prep, cleaning, laundry, medication management, personal finances, driving, shopping, community activity, and yard work  PERSONAL FACTORS: Age, Fitness, Sex, Time since onset of injury/illness/exacerbation, and 3+ comorbidities: PMH includes hx of breast cancer, HTN, MCI, insomnia, cataracts, bipolar 1, alcohol use disorder, GAD, MDD, osteopenia of neck of R femur, chronic B low back pain with radiating pain to buttock and posterior thighs and calves. Pt with multiple comorbidities, please refer to chart for full details  are also affecting patient's functional outcome.   REHAB POTENTIAL: Fair  CLINICAL DECISION MAKING: Evolving/moderate complexity  EVALUATION COMPLEXITY: High  PLAN:  PT FREQUENCY: 2x/week  PT DURATION: 12 weeks  PLANNED INTERVENTIONS: Therapeutic exercises, Therapeutic activity, Neuromuscular re-education, Balance training, Gait training, Patient/Family education, Self Care, Joint mobilization, Stair training, Vestibular training, Canalith repositioning, Visual/preceptual remediation/compensation, Orthotic/Fit training, DME instructions, Wheelchair mobility training, Spinal  mobilization, Cryotherapy, Moist heat, Splintting, Taping, and Manual therapy  PLAN FOR NEXT SESSION: hamtrings loading, hip strength, mobility, endurance, gait and balance   3:30 PM, 03/25/23 Temple Pacini PT, DPT  Physical Therapist - [facility]  [facility] [phone]     Baird Kay, PT 03/25/2023, 3:30 PM

## 2023-03-30 ENCOUNTER — Ambulatory Visit: Payer: Medicare PPO

## 2023-03-30 DIAGNOSIS — M6281 Muscle weakness (generalized): Secondary | ICD-10-CM

## 2023-03-30 DIAGNOSIS — R262 Difficulty in walking, not elsewhere classified: Secondary | ICD-10-CM

## 2023-03-30 DIAGNOSIS — R2681 Unsteadiness on feet: Secondary | ICD-10-CM | POA: Diagnosis not present

## 2023-03-30 DIAGNOSIS — R42 Dizziness and giddiness: Secondary | ICD-10-CM | POA: Diagnosis not present

## 2023-03-30 DIAGNOSIS — R278 Other lack of coordination: Secondary | ICD-10-CM

## 2023-03-30 DIAGNOSIS — M25552 Pain in left hip: Secondary | ICD-10-CM | POA: Diagnosis not present

## 2023-03-30 NOTE — Therapy (Signed)
OUTPATIENT NEURO PHYSICAL THERAPY TREATMENT/Physical Therapy Progress Note   Dates of reporting period  02/23/2023   to   03/30/2023     Patient Name: Kendra [name] MRN: [id number] DOB:November 27, 1945, 77 y.o., female Today's Date: 03/31/2023  PCP: Erasmo Downer, MD REFERRING PROVIDER: Erasmo Downer, MD  END OF SESSION:  PT End of Session - 03/30/23 1258     Visit Number 40    Number of Visits 60    Date for PT Re-Evaluation 06/08/23    Authorization Type [employer] PPO    Authorization Time Period 11/04/2022-12/30/2022    Progress Note Due on Visit 30    PT Start Time 1300    PT Stop Time 1342    PT Time Calculation (min) 42 min    Equipment Utilized During Treatment Gait belt    Activity Tolerance Patient tolerated treatment well;Patient limited by fatigue    Behavior During Therapy WFL for tasks assessed/performed                       Past Medical History:  Diagnosis Date   Anxiety    Breast cancer (HCC) 03/2018   right breast cancer at 11:00 and 1:00   Bursitis    bilateral hips and knees   Complication of anesthesia    Depression    Family history of breast cancer 03/24/2018   Family history of lung cancer 03/24/2018   History of alcohol dependence (HCC) 2007   no ETOH; resolved since 2007   History of hiatal hernia    History of psychosis 2014   due to sleep disturbance   Hypertension    Parkinson disease    Personal history of radiation therapy 06/2018-07/2018   right breast ca   PONV (postoperative nausea and vomiting)    Past Surgical History:  Procedure Laterality Date   BREAST BIOPSY Right 03/14/2018   11:00 DCIS and invasive ductal carcinoma   BREAST BIOPSY Right 03/14/2018   1:00 Invasive ductal carcinoma   BREAST LUMPECTOMY Right 04/27/2018   lumpectomy of 11 and 1:00 cancers, clear margins, negative LN   BREAST LUMPECTOMY WITH RADIOACTIVE SEED AND SENTINEL LYMPH NODE BIOPSY Right 04/27/2018   Procedure: RIGHT  BREAST LUMPECTOMY WITH RADIOACTIVE SEED X 2 AND RIGHT SENTINEL LYMPH NODE BIOPSY;  Surgeon: Glenna Fellows, MD;  Location: [facility];  Service: General;  Laterality: Right;   CATARACT EXTRACTION W/PHACO Left 08/30/2019   Procedure: CATARACT EXTRACTION PHACO AND INTRAOCULAR LENS PLACEMENT (IOC) LEFT panoptix toric  01:20.5  12.7%  10.26;  Surgeon: Lockie Mola, MD;  Location: [facility];  Service: Ophthalmology;  Laterality: Left;   CATARACT EXTRACTION W/PHACO Right 09/20/2019   Procedure: CATARACT EXTRACTION PHACO AND INTRAOCULAR LENS PLACEMENT (IOC) RIGHT 5.71  00:36.4  15.7%;  Surgeon: Lockie Mola, MD;  Location: [facility];  Service: Ophthalmology;  Laterality: Right;   LAPAROTOMY N/A 02/10/2018   Procedure: EXPLORATORY LAPAROTOMY;  Surgeon: Shonna Chock, MD;  Location: [facility];  Service: Gynecology;  Laterality: N/A;   MASS EXCISION  01/2018   abdominal   OVARIAN CYST REMOVAL     SALPINGOOPHORECTOMY Bilateral 02/10/2018   Procedure: BILATERAL SALPINGO OOPHORECTOMY; PERITONEAL WASHINGS;  Surgeon: Shonna Chock, MD;  Location: [facility];  Service: Gynecology;  Laterality: Bilateral;   TONSILLECTOMY     TONSILLECTOMY AND ADENOIDECTOMY  1953   Patient Active Problem List   Diagnosis Date Noted   Prediabetes 12/15/2022   Chronic cough 08/17/2022  History of allergy to shellfish 02/24/2022   Chronic low back pain of over 3 months duration 02/04/2022   Osteoarthritis of  AC (acromioclavicular) joint (Left) 02/04/2022   Osteoarthritis of hip (Right) 02/04/2022   Impaction fracture of distal end of radius and ulna, sequela (Left) 02/04/2022   Osteoarthritis of 1st (thumb) carpometacarpal (CMC) joint of hand (Left) 02/04/2022   Lumbosacral facet arthropathy (Multilevel) (Bilateral) (T12-S1) 02/04/2022   Lumbar central spinal stenosis w/o neurogenic claudication (L2-3, L3-4, L4-5) 02/04/2022   Lumbar foraminal stenosis (Bilateral: L2-3,  L4-5) (Right: (Severe) L4-5) 02/04/2022   Lumbar lateral recess stenosis (Bilateral: L2-3, L3-4, and L4-5) (Severe) 02/04/2022   DDD (degenerative disc disease), lumbar 02/04/2022   Lumbosacral facet syndrome (Multilevel) (Bilateral) 02/04/2022   Osteoarthritis of lumbar spine 02/04/2022   Allergic conjunctivitis of both eyes 12/23/2021   Chronic hip pain (3ry area of Pain) (Bilateral) (L>R) 12/22/2021   Chronic lower extremity pain (2ry area of Pain) (Bilateral) (L>R) 12/22/2021   Abnormal MRI, lumbar spine (01/31/2020) 12/22/2021   Grade 1 Anterolisthesis of lumbar spine (L3/L4) & (5 mm) (L4/L5) 12/22/2021   Chronic pain syndrome 11/17/2021   High risk medication use 11/17/2021   Disorder of skeletal system 11/17/2021   Problems influencing health status 11/17/2021   Other hyperlipidemia 10/21/2021   Polypharmacy 08/12/2021   Chronic low back pain (1ry area of Pain) (Bilateral) (L>R) w/o sciatica 06/18/2021   Osteopenia of neck of femur (Right) 09/06/2020   Obesity 08/29/2020   Parkinson disease 02/22/2020   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   Bipolar disorder, in full remission, most recent episode depressed (HCC) 11/08/2019   Vitamin D insufficiency 08/24/2019   Insomnia due to mental condition 05/31/2019   Bipolar I disorder, most recent episode depressed (HCC) 05/31/2019   Alcohol use disorder, moderate, in sustained remission (HCC) 05/31/2019   Elevated tumor markers 05/27/2019   GAD (generalized anxiety disorder) 04/10/2019   MDD (major depressive disorder), severe (HCC) 01/31/2019   High serum carbohydrate antigen 19-9 (CA19-9) 12/01/2018   Adjustment disorder with anxious mood 11/24/2018   Essential hypertension 11/24/2018   B12 deficiency 10/17/2018   Incisional hernia, without obstruction or gangrene 09/12/2018   Genetic testing 04/07/2018   Family history of breast cancer 03/24/2018   Family history of lung cancer 03/24/2018   Malignant neoplasm of upper-outer quadrant  of right breast in female, estrogen receptor positive (HCC) 03/17/2018   Malignant neoplasm of upper-inner quadrant of right breast in female, estrogen receptor positive (HCC) 03/17/2018   History of psychosis 02/07/2018   History of insomnia 02/07/2018    ONSET DATE: Pt diagnosed with PD in 2019  REFERRING DIAG:  G20 (ICD-10-CM) - Parkinson's disease      THERAPY DIAG:  Pain in left hip  Muscle weakness (generalized)  Unsteadiness on feet  Difficulty in walking, not elsewhere classified  Other lack of coordination  Rationale for Evaluation and Treatment: Rehabilitation  SUBJECTIVE: Pt with continued L hip pain. Pt reports no falls. Pt dropped off today by caregiver (pt reports it is her caregiver's first day).                                                                 Pain: L hip pain Pt accompanied by: significant other self  PERTINENT HISTORY:  Pt is a pleasant 77 yo female who was diagnosed with PD in 2019. She is known to clinic and [facility], and was last seen for LSVT BIG Sept-Oct 2022. Pt with chronic pain and has become increasingly sedentary as a result exacerbating mobility difficulties due to PD. Current concerns: Pt reports L and R hip pain and back pain. Pt was being seen by pain management clinic. Pt has had injections that provided relief at the time. Pt's spouse reports her physician determined surgery was not an option for her pain so the plan is to manage it. Pt spouse thinks pain has resulted in reluctance for pt to move. Pt currently sedentary, doesn't walk much, but when she does she holds on to furniture for support due to balance problems. When pt first stands she must hold on to something due to mix of pain and/or dizziness (described as lightheadedness). Her pain can go from 3/10 to 9/10. It does improve once she starts moving. Pt has increased difficulty with multiple activities including: getting in and out of her bed, getting in and out of her car,  freezing, decreased gait speed, decreased balance and cognition, and difficulty using BUE due to tremors.  Per chart other PMH includes hx of breast cancer, HTN, MCI, insomnia, cataracts, bipolar 1, alcohol use disorder, GAD, MDD, osteopenia of neck of R femur, chronic B low back pain with radiating pain to buttock and posterior thighs and calves. Pt with multiple comorbidities, please refer to chart for full details    PAIN: from eval Are you having pain? Yes: NPRS scale: not rated currently but can reach 9/10 Pain location: L hip and L leg Pain description: pt has difficulty describing Aggravating factors: unsure Relieving factors: laying in the recliner helps, movement  PRECAUTIONS: Fall  WEIGHT BEARING RESTRICTIONS: No  FALLS: Has patient fallen in last 6 months? Yes. Number of falls 1x, leg tangled in chair leg when getting up from a chair  PLOF: Independent with basic ADLs  PATIENT GOALS: improve mobility   BLE Strength Assessment      Right  Left  Hip Flexion 5/5 4/5 (painful)  Hip Horizontal ABDCT  5/5 5/5  Hip Horizontal ADD 4+/5 4+/5  Hip IR (seated)  4+/5 4+/5  Hip ER (seated) 5/5 5/5  Knee Extension 5/5 5/5  Knee Flexion (seated) 4+/5  5/5 (not painful)  Hip ABDCT (supine)  3+/5 4-/5   Focal popliteal edema Left knee, not painful   ROM Assessment Hip     Right  Left   Flexion Supine 132 120 (ouch)  External rotation (90/90) 30 47  Internal Rotation (90/90) 40  20  Supine Hip Extension P/ROM >0 degrees >0 degrees       IMAGING: via chart   07/24/22 DG BONE density " ASSESSMENT: The probability of a major osteoporotic fracture is 15.8% within the next ten years.   The probability of a hip fracture is 2.9% within the next ten years."   12/22/21 DG lumbar spine " IMPRESSION: 1. No acute displaced fracture or traumatic listhesis of the lumbar spine. 2. Interval development of grade 1 anterolisthesis of L3 on L4. Stable grade 1 anterolisthesis of L4 on  L5. Appears stable on flexion extension views. 3.  Aortic Atherosclerosis (ICD10-I70.0)."   New from chart:  DG Lumbar spine 01/05/23: "IMPRESSION: 1. Lumbar spine scoliosis concave right. Diffuse multilevel degenerative change. Disc degeneration most prominent at L2-L3 and L5-S1. 5 mm anterolisthesis L4 on L5. Diffuse severe facet hypertrophy. No flexion  or extension abnormality identified. No evidence of fracture. 2. Sclerotic densities are noted in the L2 and L4 vertebral bodies. Although these could represent bone islands blastic metastatic disease cannot be excluded. MRI of the lumbar spine suggested for further evaluation. 3. Large amount of stool noted throughout the colon. 4. Aortoiliac atherosclerotic vascular disease."    DG hip 01/05/23: "IMPRESSION: Moderate bilateral femoroacetabular osteoarthritis, similar to prior."   PATIENT EDUCATION:  Education details: Pt educated throughout session about proper posture and technique with exercises. Improved exercise technique, movement at target joints, use of target muscles after min to mod verbal, visual, tactile cues. LSVT BIG interventions Person educated: Patient and Spouse Education method: Explanation, Demonstration, and Verbal cues Education comprehension: verbalized understanding, returned demonstration, verbal cues required, tactile cues required, and needs further education   TODAY'S TREATMENT:                                                                                                                                         DATE: 03/31/23 Gait belt donned and CGA provided throughout unless specified otherwise TA:  Goal retesting completed on this date. PT instructs pt throughout in technique, indications of performance. Please refer to goal section below for details.   TE:  Nustep interval training: Lvl 1 x 1 min Lvl 2 x 1 min. Reports level 2 is fatiguing Lvl 1 x 4 min Comments: Min assist for LE  placement/positioning. Pt maintains SPM in 40s, around 45. PT cues pt for pacing/speed   2.5# AW donned each LE, ambulation 1x 296 ft with 4WW, standing rest break  Pt and caregiver education on importance of HEP   HOME EXERCISE PROGRAM:   Access Code: [email or web address] URL: [email or web address] Date: 12/22/2022 Prepared by: Temple Pacini  Exercises - Sit to Stand with Counter Support  - 1 x daily - 5 x weekly - 3 sets - 10 reps  GOALS: Goals reviewed with patient? Yes  SHORT TERM GOALS: Target date: 04/27/2023    Patient will be independent in home exercise program to improve strength/mobility for better functional independence with ADLs. Baseline: LSVT BIG protocol to be initiated next session; 12/18: not yet fully indep, reports she still gets confused about the technique; 12/03/2022: HEP to be updated; Pt reports practicing HEP 2x/week; 02/23/23: not currently performing HEP, reviewed in session and provided new copy; 03/16/2023: pt reports performing HEP every day; 03/30/23: Pt reports she is not being consistent with her HEP Goal status: MET (previously MET)   LONG TERM GOALS: Target date: 06/08/2023     Patient will increase FOTO score to equal to or greater than 49 to demonstrate statistically significant improvement in mobility and quality of life.  Baseline: 40; 12/18: deferred due to time; 45; 3/19: deferred to next visit; 01/21/2023; 02/23/2023: 46 unchanged from previous assessment); 03/17/2023: 48 Goal status: PARTIALLY MET  2.  Patient (> 23 years old) will complete  five times sit to stand test in < 15 seconds indicating decreased fall risk.  Baseline: 27 sec; 12/18 23 sec hands-free; 11/04/22: 24 sec hands free; 2/1/124: 22.7 sec use of BUE, 21.3 sec hands-free; 01/19/23: 18 seconds hands-free; 02/23/23: 19 seconds hands-free; 03/16/2023: 18 seconds hands-free; 03/30/23: 17.3 seconds hands-free  Goal status: IN PROGRESS  3.  Patient will increase 10 meter walk test to  >1.39m/s with WFL B step-length, upright posture for >90% of test to improve ease with gait and to override hypokentic and bradykinetic movement.  Baseline: 0.58 m/s with 1OX; 12/18: 0.7 m/s with 0RU; 11/04/22: 0.69 m/s with 0AV;  12/03/22: 0.73 m/s with 4UJ; 01/19/2023: 0.77 m/s with 8JX; 02/23/2023: 0.83 m/s with 9JY; 03/16/2023: 0.83 m/s with 7WG , pt still somewhat crouched, requires cues for upright posture >50% of time; 03/30/23: 0.83 m/s with crouched posture  Goal status: IN PROGRESS  4.  Patient will reduce time to don jacket by at least 5 seconds in order to increase ease with dressing and override bradykinetic movement Baseline: 17 seconds; 12/18: 12 seconds Goal status: MET  5.  Pt will demo or report ability to open standard jar using only 1-2 attempts in order to exhibit improved functional mobility for ADLs.  Baseline: 5.7 seconds with multiple attempts to turn jar lid to open; 12/18: 2.8 sec opens in 2 turns Goal status: MET  6.  Patient will increase Berg Balance score by > 6 points to demonstrate decreased fall risk during functional activities. Baseline: to be tested next 1-2 visits; 12/03/2022: 44/56; 01/21/23 45/56; 48/56; 03/17/2023: 48/56 Goal status: In Progress  7.  Patient will report at least 4 days in a row with minimal to no L hip pain in order to improve QOL and ease/safety with functional mobility. Baseline: Pt reports pain majority of days in L hip; 12/03/2022: pt reports having hip pain about every day; 3/19: rates hip pain daily (being seen now by MD, new imaging); 02/23/2023: pt went for period of time without hip pain (she thinks a week), but reports it has now come back,; 5/28: pt reports hip pain "every other day" Goal status: In Progress  8. Patient will increase six minute walk test distance to >1000 for progression to community ambulator and improve gait ability Baseline:02/23/2023: 722 ft with 4WW and two standing rest breaks, rates test as hard; 03/16/2023: deferred to  next visit due to time; 5/28/20204: 641 ft with 4WW, pt requests to end test early due to fatigue at 5 min 35 sec Goal status: IN PROGRESS  ASSESSMENT:  CLINICAL IMPRESSION: Goals reassessed for progress note. Pt with similar performance overall compared to recent recert testing on 03/16/2023. Pt with slight improvement on 5xSTS, but also with decrease on . Sharlene Motts not reassessed on this date, but pt did score 48/56 on 5/15 (same score as 01/21/23). Plan to continue to focus on improving cardio, functional capaicty and remaining balance deficits. Patient's condition has the potential to improve in response to therapy. Maximum improvement is yet to be obtained. The anticipated improvement is attainable and reasonable in a generally predictable time. The pt will benefit from further skilled PT to improve strength, balance, gait and mobility.  OBJECTIVE IMPAIRMENTS: Abnormal gait, decreased balance, decreased cognition, decreased coordination, decreased endurance, decreased knowledge of use of DME, decreased mobility, difficulty walking, decreased strength, dizziness, hypomobility, impaired UE functional use, improper body mechanics, postural dysfunction, and pain.   ACTIVITY LIMITATIONS: lifting, bending, standing, squatting, stairs, transfers, bed mobility, bathing, dressing, hygiene/grooming,  and locomotion level  PARTICIPATION LIMITATIONS: meal prep, cleaning, laundry, medication management, personal finances, driving, shopping, community activity, and yard work  PERSONAL FACTORS: Age, Fitness, Sex, Time since onset of injury/illness/exacerbation, and 3+ comorbidities: PMH includes hx of breast cancer, HTN, MCI, insomnia, cataracts, bipolar 1, alcohol use disorder, GAD, MDD, osteopenia of neck of R femur, chronic B low back pain with radiating pain to buttock and posterior thighs and calves. Pt with multiple comorbidities, please refer to chart for full details  are also affecting patient's functional  outcome.   REHAB POTENTIAL: Fair    CLINICAL DECISION MAKING: Evolving/moderate complexity  EVALUATION COMPLEXITY: High  PLAN:  PT FREQUENCY: 2x/week  PT DURATION: 12 weeks  PLANNED INTERVENTIONS: Therapeutic exercises, Therapeutic activity, Neuromuscular re-education, Balance training, Gait training, Patient/Family education, Self Care, Joint mobilization, Stair training, Vestibular training, Canalith repositioning, Visual/preceptual remediation/compensation, Orthotic/Fit training, DME instructions, Wheelchair mobility training, Spinal mobilization, Cryotherapy, Moist heat, Splintting, Taping, and Manual therapy  PLAN FOR NEXT SESSION: hamtrings loading, hip strength, mobility, endurance, gait and balance   9:07 AM, 03/31/23 Temple Pacini PT, DPT  Physical Therapist - [facility]  [facility] [phone]     Baird Kay, PT 03/31/2023, 9:07 AM

## 2023-04-01 ENCOUNTER — Ambulatory Visit: Payer: Medicare PPO

## 2023-04-01 DIAGNOSIS — R42 Dizziness and giddiness: Secondary | ICD-10-CM | POA: Diagnosis not present

## 2023-04-01 DIAGNOSIS — R278 Other lack of coordination: Secondary | ICD-10-CM

## 2023-04-01 DIAGNOSIS — R262 Difficulty in walking, not elsewhere classified: Secondary | ICD-10-CM

## 2023-04-01 DIAGNOSIS — M25552 Pain in left hip: Secondary | ICD-10-CM | POA: Diagnosis not present

## 2023-04-01 DIAGNOSIS — R2681 Unsteadiness on feet: Secondary | ICD-10-CM | POA: Diagnosis not present

## 2023-04-01 DIAGNOSIS — M6281 Muscle weakness (generalized): Secondary | ICD-10-CM | POA: Diagnosis not present

## 2023-04-01 NOTE — Therapy (Signed)
OUTPATIENT NEURO PHYSICAL THERAPY TREATMENT    Patient Name: Kendra [name] MRN: [id number] DOB:[birth year], 77 y.o., female Today's Date: 04/01/2023  PCP: Erasmo Downer, MD REFERRING PROVIDER: Erasmo Downer, MD  END OF SESSION:  PT End of Session - 04/01/23 1304     Visit Number 41    Number of Visits 60    Date for PT Re-Evaluation 06/08/23    Authorization Type [employer] PPO    Authorization Time Period 11/04/2022-12/30/2022    Progress Note Due on Visit 30    PT Start Time 1303    PT Stop Time 1344    PT Time Calculation (min) 41 min    Equipment Utilized During Treatment Gait belt    Activity Tolerance Patient tolerated treatment well;Patient limited by fatigue    Behavior During Therapy WFL for tasks assessed/performed                       Past Medical History:  Diagnosis Date   Anxiety    Breast cancer (HCC) 03/2018   right breast cancer at 11:00 and 1:00   Bursitis    bilateral hips and knees   Complication of anesthesia    Depression    Family history of breast cancer 03/24/2018   Family history of lung cancer 03/24/2018   History of alcohol dependence (HCC) 2007   no ETOH; resolved since 2007   History of hiatal hernia    History of psychosis 2014   due to sleep disturbance   Hypertension    Parkinson disease    Personal history of radiation therapy 06/2018-07/2018   right breast ca   PONV (postoperative nausea and vomiting)    Past Surgical History:  Procedure Laterality Date   BREAST BIOPSY Right 03/14/2018   11:00 DCIS and invasive ductal carcinoma   BREAST BIOPSY Right 03/14/2018   1:00 Invasive ductal carcinoma   BREAST LUMPECTOMY Right 04/27/2018   lumpectomy of 11 and 1:00 cancers, clear margins, negative LN   BREAST LUMPECTOMY WITH RADIOACTIVE SEED AND SENTINEL LYMPH NODE BIOPSY Right 04/27/2018   Procedure: RIGHT BREAST LUMPECTOMY WITH RADIOACTIVE SEED X 2 AND RIGHT SENTINEL LYMPH NODE BIOPSY;  Surgeon:  Glenna Fellows, MD;  Location: [facility];  Service: General;  Laterality: Right;   CATARACT EXTRACTION W/PHACO Left 08/30/2019   Procedure: CATARACT EXTRACTION PHACO AND INTRAOCULAR LENS PLACEMENT (IOC) LEFT panoptix toric  01:20.5  12.7%  10.26;  Surgeon: Lockie Mola, MD;  Location: [facility];  Service: Ophthalmology;  Laterality: Left;   CATARACT EXTRACTION W/PHACO Right 09/20/2019   Procedure: CATARACT EXTRACTION PHACO AND INTRAOCULAR LENS PLACEMENT (IOC) RIGHT 5.71  00:36.4  15.7%;  Surgeon: Lockie Mola, MD;  Location: [facility];  Service: Ophthalmology;  Laterality: Right;   LAPAROTOMY N/A 02/10/2018   Procedure: EXPLORATORY LAPAROTOMY;  Surgeon: Shonna Chock, MD;  Location: [facility];  Service: Gynecology;  Laterality: N/A;   MASS EXCISION  01/2018   abdominal   OVARIAN CYST REMOVAL     SALPINGOOPHORECTOMY Bilateral 02/10/2018   Procedure: BILATERAL SALPINGO OOPHORECTOMY; PERITONEAL WASHINGS;  Surgeon: Shonna Chock, MD;  Location: [facility];  Service: Gynecology;  Laterality: Bilateral;   TONSILLECTOMY     TONSILLECTOMY AND ADENOIDECTOMY  1953   Patient Active Problem List   Diagnosis Date Noted   Prediabetes 12/15/2022   Chronic cough 08/17/2022   History of allergy to shellfish 02/24/2022   Chronic low back pain of over 3 months duration  02/04/2022   Osteoarthritis of  AC (acromioclavicular) joint (Left) 02/04/2022   Osteoarthritis of hip (Right) 02/04/2022   Impaction fracture of distal end of radius and ulna, sequela (Left) 02/04/2022   Osteoarthritis of 1st (thumb) carpometacarpal (CMC) joint of hand (Left) 02/04/2022   Lumbosacral facet arthropathy (Multilevel) (Bilateral) (T12-S1) 02/04/2022   Lumbar central spinal stenosis w/o neurogenic claudication (L2-3, L3-4, L4-5) 02/04/2022   Lumbar foraminal stenosis (Bilateral: L2-3, L4-5) (Right: (Severe) L4-5) 02/04/2022   Lumbar lateral recess stenosis (Bilateral: L2-3,  L3-4, and L4-5) (Severe) 02/04/2022   DDD (degenerative disc disease), lumbar 02/04/2022   Lumbosacral facet syndrome (Multilevel) (Bilateral) 02/04/2022   Osteoarthritis of lumbar spine 02/04/2022   Allergic conjunctivitis of both eyes 12/23/2021   Chronic hip pain (3ry area of Pain) (Bilateral) (L>R) 12/22/2021   Chronic lower extremity pain (2ry area of Pain) (Bilateral) (L>R) 12/22/2021   Abnormal MRI, lumbar spine (01/31/2020) 12/22/2021   Grade 1 Anterolisthesis of lumbar spine (L3/L4) & (5 mm) (L4/L5) 12/22/2021   Chronic pain syndrome 11/17/2021   High risk medication use 11/17/2021   Disorder of skeletal system 11/17/2021   Problems influencing health status 11/17/2021   Other hyperlipidemia 10/21/2021   Polypharmacy 08/12/2021   Chronic low back pain (1ry area of Pain) (Bilateral) (L>R) w/o sciatica 06/18/2021   Osteopenia of neck of femur (Right) 09/06/2020   Obesity 08/29/2020   Parkinson disease 02/22/2020   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   Bipolar disorder, in full remission, most recent episode depressed (HCC) 11/08/2019   Vitamin D insufficiency 08/24/2019   Insomnia due to mental condition 05/31/2019   Bipolar I disorder, most recent episode depressed (HCC) 05/31/2019   Alcohol use disorder, moderate, in sustained remission (HCC) 05/31/2019   Elevated tumor markers 05/27/2019   GAD (generalized anxiety disorder) 04/10/2019   MDD (major depressive disorder), severe (HCC) 01/31/2019   High serum carbohydrate antigen 19-9 (CA19-9) 12/01/2018   Adjustment disorder with anxious mood 11/24/2018   Essential hypertension 11/24/2018   B12 deficiency 10/17/2018   Incisional hernia, without obstruction or gangrene 09/12/2018   Genetic testing 04/07/2018   Family history of breast cancer 03/24/2018   Family history of lung cancer 03/24/2018   Malignant neoplasm of upper-outer quadrant of right breast in female, estrogen receptor positive (HCC) 03/17/2018   Malignant  neoplasm of upper-inner quadrant of right breast in female, estrogen receptor positive (HCC) 03/17/2018   History of psychosis 02/07/2018   History of insomnia 02/07/2018    ONSET DATE: Pt diagnosed with PD in 2019  REFERRING DIAG:  G20 (ICD-10-CM) - Parkinson's disease      THERAPY DIAG:  Muscle weakness (generalized)  Difficulty in walking, not elsewhere classified  Other lack of coordination  Unsteadiness on feet  Pain in left hip  Dizziness and giddiness  Rationale for Evaluation and Treatment: Rehabilitation  SUBJECTIVE: Pt with continued L hip pain. Pt reports no falls. Pt dropped off today by caregiver (pt reports it is her caregiver's first day).                                                                 Pain: L hip pain Pt accompanied by: significant other self  PERTINENT HISTORY:  Pt is a pleasant 77 yo female who was diagnosed with PD in  2019. She is known to clinic and [facility], and was last seen for LSVT BIG Sept-Oct 2022. Pt with chronic pain and has become increasingly sedentary as a result exacerbating mobility difficulties due to PD. Current concerns: Pt reports L and R hip pain and back pain. Pt was being seen by pain management clinic. Pt has had injections that provided relief at the time. Pt's spouse reports her physician determined surgery was not an option for her pain so the plan is to manage it. Pt spouse thinks pain has resulted in reluctance for pt to move. Pt currently sedentary, doesn't walk much, but when she does she holds on to furniture for support due to balance problems. When pt first stands she must hold on to something due to mix of pain and/or dizziness (described as lightheadedness). Her pain can go from 3/10 to 9/10. It does improve once she starts moving. Pt has increased difficulty with multiple activities including: getting in and out of her bed, getting in and out of her car, freezing, decreased gait speed, decreased balance and  cognition, and difficulty using BUE due to tremors.  Per chart other PMH includes hx of breast cancer, HTN, MCI, insomnia, cataracts, bipolar 1, alcohol use disorder, GAD, MDD, osteopenia of neck of R femur, chronic B low back pain with radiating pain to buttock and posterior thighs and calves. Pt with multiple comorbidities, please refer to chart for full details    PAIN: from eval Are you having pain? Yes: NPRS scale: not rated currently but can reach 9/10 Pain location: L hip and L leg Pain description: pt has difficulty describing Aggravating factors: unsure Relieving factors: laying in the recliner helps, movement  PRECAUTIONS: Fall  WEIGHT BEARING RESTRICTIONS: No  FALLS: Has patient fallen in last 6 months? Yes. Number of falls 1x, leg tangled in chair leg when getting up from a chair  PLOF: Independent with basic ADLs  PATIENT GOALS: improve mobility   BLE Strength Assessment      Right  Left  Hip Flexion 5/5 4/5 (painful)  Hip Horizontal ABDCT  5/5 5/5  Hip Horizontal ADD 4+/5 4+/5  Hip IR (seated)  4+/5 4+/5  Hip ER (seated) 5/5 5/5  Knee Extension 5/5 5/5  Knee Flexion (seated) 4+/5  5/5 (not painful)  Hip ABDCT (supine)  3+/5 4-/5   Focal popliteal edema Left knee, not painful   ROM Assessment Hip     Right  Left   Flexion Supine 132 120 (ouch)  External rotation (90/90) 30 47  Internal Rotation (90/90) 40  20  Supine Hip Extension P/ROM >0 degrees >0 degrees       IMAGING: via chart   07/24/22 DG BONE density " ASSESSMENT: The probability of a major osteoporotic fracture is 15.8% within the next ten years.   The probability of a hip fracture is 2.9% within the next ten years."   12/22/21 DG lumbar spine " IMPRESSION: 1. No acute displaced fracture or traumatic listhesis of the lumbar spine. 2. Interval development of grade 1 anterolisthesis of L3 on L4. Stable grade 1 anterolisthesis of L4 on L5. Appears stable on flexion extension views. 3.   Aortic Atherosclerosis (ICD10-I70.0)."   New from chart:  DG Lumbar spine 01/05/23: "IMPRESSION: 1. Lumbar spine scoliosis concave right. Diffuse multilevel degenerative change. Disc degeneration most prominent at L2-L3 and L5-S1. 5 mm anterolisthesis L4 on L5. Diffuse severe facet hypertrophy. No flexion or extension abnormality identified. No evidence of fracture. 2. Sclerotic densities are noted  in the L2 and L4 vertebral bodies. Although these could represent bone islands blastic metastatic disease cannot be excluded. MRI of the lumbar spine suggested for further evaluation. 3. Large amount of stool noted throughout the colon. 4. Aortoiliac atherosclerotic vascular disease."    DG hip 01/05/23: "IMPRESSION: Moderate bilateral femoroacetabular osteoarthritis, similar to prior."   PATIENT EDUCATION:  Education details: Pt educated throughout session about proper posture and technique with exercises. Improved exercise technique, movement at target joints, use of target muscles after min to mod verbal, visual, tactile cues.  Person educated: Patient and Spouse Education method: Explanation, Demonstration, and Verbal cues Education comprehension: verbalized understanding, returned demonstration, verbal cues required, tactile cues required, and needs further education   TODAY'S TREATMENT:                                                                                                                                         DATE: 04/01/23 Gait belt donned and CGA provided throughout unless specified otherwise  TE:  Circuit: 2 rounds with 2.5# AW donned each LE- -ambulation 1x 296 ft with 4WW -LAQ 15x each LE  -STS 10x  Circuit: 2 rounds with 2.5# AW donned each LE- -ambulation 1x 296 ft with 1OX -STS 10x with 2# bar shoulder press bilat UE  -Seated lateral step out  10x each LE    Standing step LTL and FWD with 2.5# AW donned 10x for each, each LE. Pt rates medium  Standing  chest press with 2# bar 2x10.  Pt reports interventions fatiguing to BUE      HOME EXERCISE PROGRAM: pt to continue HEP as previously given  Access Code: [email or web address] URL: [email or web address] Date: 12/22/2022 Prepared by: Temple Pacini  Exercises - Sit to Stand with Counter Support  - 1 x daily - 5 x weekly - 3 sets - 10 reps  GOALS: Goals reviewed with patient? Yes  SHORT TERM GOALS: Target date: 04/27/2023    Patient will be independent in home exercise program to improve strength/mobility for better functional independence with ADLs. Baseline: LSVT BIG protocol to be initiated next session; 12/18: not yet fully indep, reports she still gets confused about the technique; 12/03/2022: HEP to be updated; Pt reports practicing HEP 2x/week; 02/23/23: not currently performing HEP, reviewed in session and provided new copy; 03/16/2023: pt reports performing HEP every day; 03/30/23: Pt reports she is not being consistent with her HEP Goal status: MET (previously MET)   LONG TERM GOALS: Target date: 06/08/2023     Patient will increase FOTO score to equal to or greater than 49 to demonstrate statistically significant improvement in mobility and quality of life.  Baseline: 40; 12/18: deferred due to time; 45; 3/19: deferred to next visit; 01/21/2023; 02/23/2023: 46 unchanged from previous assessment); 03/17/2023: 48 Goal status: PARTIALLY MET  2.  Patient (> 57 years old) will complete five times sit to stand test in <  15 seconds indicating decreased fall risk.  Baseline: 27 sec; 12/18 23 sec hands-free; 11/04/22: 24 sec hands free; 2/1/124: 22.7 sec use of BUE, 21.3 sec hands-free; 01/19/23: 18 seconds hands-free; 02/23/23: 19 seconds hands-free; 03/16/2023: 18 seconds hands-free; 03/30/23: 17.3 seconds hands-free  Goal status: IN PROGRESS  3.  Patient will increase 10 meter walk test to >1.35m/s with WFL B step-length, upright posture for >90% of test to improve ease with gait and to  override hypokentic and bradykinetic movement.  Baseline: 0.58 m/s with 1XB; 12/18: 0.7 m/s with 1YN; 11/04/22: 0.69 m/s with 8GN;  12/03/22: 0.73 m/s with 5AO; 01/19/2023: 0.77 m/s with 1HY; 02/23/2023: 0.83 m/s with 8MV; 03/16/2023: 0.83 m/s with 7QI , pt still somewhat crouched, requires cues for upright posture >50% of time; 03/30/23: 0.83 m/s with crouched posture  Goal status: IN PROGRESS  4.  Patient will reduce time to don jacket by at least 5 seconds in order to increase ease with dressing and override bradykinetic movement Baseline: 17 seconds; 12/18: 12 seconds Goal status: MET  5.  Pt will demo or report ability to open standard jar using only 1-2 attempts in order to exhibit improved functional mobility for ADLs.  Baseline: 5.7 seconds with multiple attempts to turn jar lid to open; 12/18: 2.8 sec opens in 2 turns Goal status: MET  6.  Patient will increase Berg Balance score by > 6 points to demonstrate decreased fall risk during functional activities. Baseline: to be tested next 1-2 visits; 12/03/2022: 44/56; 01/21/23 45/56; 48/56; 03/17/2023: 48/56 Goal status: In Progress  7.  Patient will report at least 4 days in a row with minimal to no L hip pain in order to improve QOL and ease/safety with functional mobility. Baseline: Pt reports pain majority of days in L hip; 12/03/2022: pt reports having hip pain about every day; 3/19: rates hip pain daily (being seen now by MD, new imaging); 02/23/2023: pt went for period of time without hip pain (she thinks a week), but reports it has now come back,; 5/28: pt reports hip pain "every other day" Goal status: In Progress  8. Patient will increase six minute walk test distance to >1000 for progression to community ambulator and improve gait ability Baseline:02/23/2023: 722 ft with 4WW and two standing rest breaks, rates test as hard; 03/16/2023: deferred to next visit due to time; 5/28/20204: 641 ft with 4WW, pt requests to end test early due to fatigue  at 5 min 35 sec Goal status: IN PROGRESS  ASSESSMENT:  CLINICAL IMPRESSION: Continued focus on improving functional capacity/cardiorespiratory and LE endurance with circuit training. Pt quick to ask for rest breaks throughout, but is able to sustain performing gait with multiple strengthening therex with only brief rest breaks. Pt did not feeling quick onset of UE fatigue with interventions. The pt will benefit from further skilled PT to improve strength, balance, gait and mobility.  OBJECTIVE IMPAIRMENTS: Abnormal gait, decreased balance, decreased cognition, decreased coordination, decreased endurance, decreased knowledge of use of DME, decreased mobility, difficulty walking, decreased strength, dizziness, hypomobility, impaired UE functional use, improper body mechanics, postural dysfunction, and pain.   ACTIVITY LIMITATIONS: lifting, bending, standing, squatting, stairs, transfers, bed mobility, bathing, dressing, hygiene/grooming, and locomotion level  PARTICIPATION LIMITATIONS: meal prep, cleaning, laundry, medication management, personal finances, driving, shopping, community activity, and yard work  PERSONAL FACTORS: Age, Fitness, Sex, Time since onset of injury/illness/exacerbation, and 3+ comorbidities: PMH includes hx of breast cancer, HTN, MCI, insomnia, cataracts, bipolar 1, alcohol use disorder, GAD,  MDD, osteopenia of neck of R femur, chronic B low back pain with radiating pain to buttock and posterior thighs and calves. Pt with multiple comorbidities, please refer to chart for full details  are also affecting patient's functional outcome.   REHAB POTENTIAL: Fair    CLINICAL DECISION MAKING: Evolving/moderate complexity  EVALUATION COMPLEXITY: High  PLAN:  PT FREQUENCY: 2x/week  PT DURATION: 12 weeks  PLANNED INTERVENTIONS: Therapeutic exercises, Therapeutic activity, Neuromuscular re-education, Balance training, Gait training, Patient/Family education, Self Care, Joint  mobilization, Stair training, Vestibular training, Canalith repositioning, Visual/preceptual remediation/compensation, Orthotic/Fit training, DME instructions, Wheelchair mobility training, Spinal mobilization, Cryotherapy, Moist heat, Splintting, Taping, and Manual therapy  PLAN FOR NEXT SESSION: hamtrings loading, hip strength, mobility, endurance, gait and balance, cardio   2:41 PM, 04/01/23 Temple Pacini PT, DPT  Physical Therapist - [facility]  [facility] [phone]     Baird Kay, PT 04/01/2023, 2:41 PM

## 2023-04-05 ENCOUNTER — Other Ambulatory Visit: Payer: Self-pay | Admitting: Family Medicine

## 2023-04-06 ENCOUNTER — Ambulatory Visit: Payer: Medicare PPO | Attending: Family Medicine

## 2023-04-06 DIAGNOSIS — R2681 Unsteadiness on feet: Secondary | ICD-10-CM

## 2023-04-06 DIAGNOSIS — M25552 Pain in left hip: Secondary | ICD-10-CM | POA: Diagnosis not present

## 2023-04-06 DIAGNOSIS — R42 Dizziness and giddiness: Secondary | ICD-10-CM | POA: Insufficient documentation

## 2023-04-06 DIAGNOSIS — R278 Other lack of coordination: Secondary | ICD-10-CM | POA: Diagnosis not present

## 2023-04-06 DIAGNOSIS — M6281 Muscle weakness (generalized): Secondary | ICD-10-CM

## 2023-04-06 DIAGNOSIS — G20A1 Parkinson's disease without dyskinesia, without mention of fluctuations: Secondary | ICD-10-CM | POA: Diagnosis not present

## 2023-04-06 DIAGNOSIS — R262 Difficulty in walking, not elsewhere classified: Secondary | ICD-10-CM

## 2023-04-06 NOTE — Therapy (Signed)
OUTPATIENT NEURO PHYSICAL THERAPY TREATMENT    Patient Name: Kendra [name] MRN: [id number] DOB:09-Oct-1946, 77 y.o., female Today's Date: 04/07/2023  PCP: Erasmo Downer, MD REFERRING PROVIDER: Erasmo Downer, MD  END OF SESSION:  PT End of Session - 04/06/23 0751     Visit Number 42    Number of Visits 60    Date for PT Re-Evaluation 06/08/23    Authorization Type [employer] PPO    Authorization Time Period 11/04/2022-12/30/2022    Progress Note Due on Visit 30    PT Start Time 1300    PT Stop Time 1344    PT Time Calculation (min) 44 min    Equipment Utilized During Treatment Gait belt    Activity Tolerance Patient tolerated treatment well;Patient limited by fatigue    Behavior During Therapy WFL for tasks assessed/performed                        Past Medical History:  Diagnosis Date   Anxiety    Breast cancer (HCC) 03/2018   right breast cancer at 11:00 and 1:00   Bursitis    bilateral hips and knees   Complication of anesthesia    Depression    Family history of breast cancer 03/24/2018   Family history of lung cancer 03/24/2018   History of alcohol dependence (HCC) 2007   no ETOH; resolved since 2007   History of hiatal hernia    History of psychosis 2014   due to sleep disturbance   Hypertension    Parkinson disease    Personal history of radiation therapy 06/2018-07/2018   right breast ca   PONV (postoperative nausea and vomiting)    Past Surgical History:  Procedure Laterality Date   BREAST BIOPSY Right 03/14/2018   11:00 DCIS and invasive ductal carcinoma   BREAST BIOPSY Right 03/14/2018   1:00 Invasive ductal carcinoma   BREAST LUMPECTOMY Right 04/27/2018   lumpectomy of 11 and 1:00 cancers, clear margins, negative LN   BREAST LUMPECTOMY WITH RADIOACTIVE SEED AND SENTINEL LYMPH NODE BIOPSY Right 04/27/2018   Procedure: RIGHT BREAST LUMPECTOMY WITH RADIOACTIVE SEED X 2 AND RIGHT SENTINEL LYMPH NODE BIOPSY;  Surgeon:  Glenna Fellows, MD;  Location: [facility];  Service: General;  Laterality: Right;   CATARACT EXTRACTION W/PHACO Left 08/30/2019   Procedure: CATARACT EXTRACTION PHACO AND INTRAOCULAR LENS PLACEMENT (IOC) LEFT panoptix toric  01:20.5  12.7%  10.26;  Surgeon: Lockie Mola, MD;  Location: [facility];  Service: Ophthalmology;  Laterality: Left;   CATARACT EXTRACTION W/PHACO Right 09/20/2019   Procedure: CATARACT EXTRACTION PHACO AND INTRAOCULAR LENS PLACEMENT (IOC) RIGHT 5.71  00:36.4  15.7%;  Surgeon: Lockie Mola, MD;  Location: [facility];  Service: Ophthalmology;  Laterality: Right;   LAPAROTOMY N/A 02/10/2018   Procedure: EXPLORATORY LAPAROTOMY;  Surgeon: Shonna Chock, MD;  Location: [facility];  Service: Gynecology;  Laterality: N/A;   MASS EXCISION  01/2018   abdominal   OVARIAN CYST REMOVAL     SALPINGOOPHORECTOMY Bilateral 02/10/2018   Procedure: BILATERAL SALPINGO OOPHORECTOMY; PERITONEAL WASHINGS;  Surgeon: Shonna Chock, MD;  Location: [facility];  Service: Gynecology;  Laterality: Bilateral;   TONSILLECTOMY     TONSILLECTOMY AND ADENOIDECTOMY  1953   Patient Active Problem List   Diagnosis Date Noted   Prediabetes 12/15/2022   Chronic cough 08/17/2022   History of allergy to shellfish 02/24/2022   Chronic low back pain of over 3 months  duration 02/04/2022   Osteoarthritis of  AC (acromioclavicular) joint (Left) 02/04/2022   Osteoarthritis of hip (Right) 02/04/2022   Impaction fracture of distal end of radius and ulna, sequela (Left) 02/04/2022   Osteoarthritis of 1st (thumb) carpometacarpal (CMC) joint of hand (Left) 02/04/2022   Lumbosacral facet arthropathy (Multilevel) (Bilateral) (T12-S1) 02/04/2022   Lumbar central spinal stenosis w/o neurogenic claudication (L2-3, L3-4, L4-5) 02/04/2022   Lumbar foraminal stenosis (Bilateral: L2-3, L4-5) (Right: (Severe) L4-5) 02/04/2022   Lumbar lateral recess stenosis (Bilateral: L2-3,  L3-4, and L4-5) (Severe) 02/04/2022   DDD (degenerative disc disease), lumbar 02/04/2022   Lumbosacral facet syndrome (Multilevel) (Bilateral) 02/04/2022   Osteoarthritis of lumbar spine 02/04/2022   Allergic conjunctivitis of both eyes 12/23/2021   Chronic hip pain (3ry area of Pain) (Bilateral) (L>R) 12/22/2021   Chronic lower extremity pain (2ry area of Pain) (Bilateral) (L>R) 12/22/2021   Abnormal MRI, lumbar spine (01/31/2020) 12/22/2021   Grade 1 Anterolisthesis of lumbar spine (L3/L4) & (5 mm) (L4/L5) 12/22/2021   Chronic pain syndrome 11/17/2021   High risk medication use 11/17/2021   Disorder of skeletal system 11/17/2021   Problems influencing health status 11/17/2021   Other hyperlipidemia 10/21/2021   Polypharmacy 08/12/2021   Chronic low back pain (1ry area of Pain) (Bilateral) (L>R) w/o sciatica 06/18/2021   Osteopenia of neck of femur (Right) 09/06/2020   Obesity 08/29/2020   Parkinson disease 02/22/2020   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   Bipolar disorder, in full remission, most recent episode depressed (HCC) 11/08/2019   Vitamin D insufficiency 08/24/2019   Insomnia due to mental condition 05/31/2019   Bipolar I disorder, most recent episode depressed (HCC) 05/31/2019   Alcohol use disorder, moderate, in sustained remission (HCC) 05/31/2019   Elevated tumor markers 05/27/2019   GAD (generalized anxiety disorder) 04/10/2019   MDD (major depressive disorder), severe (HCC) 01/31/2019   High serum carbohydrate antigen 19-9 (CA19-9) 12/01/2018   Adjustment disorder with anxious mood 11/24/2018   Essential hypertension 11/24/2018   B12 deficiency 10/17/2018   Incisional hernia, without obstruction or gangrene 09/12/2018   Genetic testing 04/07/2018   Family history of breast cancer 03/24/2018   Family history of lung cancer 03/24/2018   Malignant neoplasm of upper-outer quadrant of right breast in female, estrogen receptor positive (HCC) 03/17/2018   Malignant  neoplasm of upper-inner quadrant of right breast in female, estrogen receptor positive (HCC) 03/17/2018   History of psychosis 02/07/2018   History of insomnia 02/07/2018    ONSET DATE: Pt diagnosed with PD in 2019  REFERRING DIAG:  G20 (ICD-10-CM) - Parkinson's disease      THERAPY DIAG:  Muscle weakness (generalized)  Difficulty in walking, not elsewhere classified  Other lack of coordination  Unsteadiness on feet  Pain in left hip  Parkinson's disease, unspecified whether dyskinesia present, unspecified whether manifestations fluctuate  Rationale for Evaluation and Treatment: Rehabilitation  SUBJECTIVE: Patient reports feeling okay- states getting to know her new caregiver and denies any falls. She does endorse continued left hip pain and states trying to be more active.                                                                  Pain: L hip pain- Did not rate today.  Pt accompanied by: significant  other self  PERTINENT HISTORY:  Pt is a pleasant 76 yo female who was diagnosed with PD in 2019. She is known to clinic and [facility], and was last seen for LSVT BIG Sept-Oct 2022. Pt with chronic pain and has become increasingly sedentary as a result exacerbating mobility difficulties due to PD. Current concerns: Pt reports L and R hip pain and back pain. Pt was being seen by pain management clinic. Pt has had injections that provided relief at the time. Pt's spouse reports her physician determined surgery was not an option for her pain so the plan is to manage it. Pt spouse thinks pain has resulted in reluctance for pt to move. Pt currently sedentary, doesn't walk much, but when she does she holds on to furniture for support due to balance problems. When pt first stands she must hold on to something due to mix of pain and/or dizziness (described as lightheadedness). Her pain can go from 3/10 to 9/10. It does improve once she starts moving. Pt has increased difficulty with multiple  activities including: getting in and out of her bed, getting in and out of her car, freezing, decreased gait speed, decreased balance and cognition, and difficulty using BUE due to tremors.  Per chart other PMH includes hx of breast cancer, HTN, MCI, insomnia, cataracts, bipolar 1, alcohol use disorder, GAD, MDD, osteopenia of neck of R femur, chronic B low back pain with radiating pain to buttock and posterior thighs and calves. Pt with multiple comorbidities, please refer to chart for full details    PAIN: from eval Are you having pain? Yes: NPRS scale: not rated currently but can reach 9/10 Pain location: L hip and L leg Pain description: pt has difficulty describing Aggravating factors: unsure Relieving factors: laying in the recliner helps, movement  PRECAUTIONS: Fall  WEIGHT BEARING RESTRICTIONS: No  FALLS: Has patient fallen in last 6 months? Yes. Number of falls 1x, leg tangled in chair leg when getting up from a chair  PLOF: Independent with basic ADLs  PATIENT GOALS: improve mobility   BLE Strength Assessment      Right  Left  Hip Flexion 5/5 4/5 (painful)  Hip Horizontal ABDCT  5/5 5/5  Hip Horizontal ADD 4+/5 4+/5  Hip IR (seated)  4+/5 4+/5  Hip ER (seated) 5/5 5/5  Knee Extension 5/5 5/5  Knee Flexion (seated) 4+/5  5/5 (not painful)  Hip ABDCT (supine)  3+/5 4-/5   Focal popliteal edema Left knee, not painful   ROM Assessment Hip     Right  Left   Flexion Supine 132 120 (ouch)  External rotation (90/90) 30 47  Internal Rotation (90/90) 40  20  Supine Hip Extension P/ROM >0 degrees >0 degrees       IMAGING: via chart   07/24/22 DG BONE density " ASSESSMENT: The probability of a major osteoporotic fracture is 15.8% within the next ten years.   The probability of a hip fracture is 2.9% within the next ten years."   12/22/21 DG lumbar spine " IMPRESSION: 1. No acute displaced fracture or traumatic listhesis of the lumbar spine. 2. Interval development  of grade 1 anterolisthesis of L3 on L4. Stable grade 1 anterolisthesis of L4 on L5. Appears stable on flexion extension views. 3.  Aortic Atherosclerosis (ICD10-I70.0)."   New from chart:  DG Lumbar spine 01/05/23: "IMPRESSION: 1. Lumbar spine scoliosis concave right. Diffuse multilevel degenerative change. Disc degeneration most prominent at L2-L3 and L5-S1. 5 mm anterolisthesis L4 on L5.  Diffuse severe facet hypertrophy. No flexion or extension abnormality identified. No evidence of fracture. 2. Sclerotic densities are noted in the L2 and L4 vertebral bodies. Although these could represent bone islands blastic metastatic disease cannot be excluded. MRI of the lumbar spine suggested for further evaluation. 3. Large amount of stool noted throughout the colon. 4. Aortoiliac atherosclerotic vascular disease."    DG hip 01/05/23: "IMPRESSION: Moderate bilateral femoroacetabular osteoarthritis, similar to prior."   PATIENT EDUCATION:  Education details: Pt educated throughout session about proper posture and technique with exercises. Improved exercise technique, movement at target joints, use of target muscles after min to mod verbal, visual, tactile cues.  Person educated: Patient and Spouse Education method: Explanation, Demonstration, and Verbal cues Education comprehension: verbalized understanding, returned demonstration, verbal cues required, tactile cues required, and needs further education   TODAY'S TREATMENT:                                                                                                                                         DATE: 04/06/2023  Gait belt donned and CGA provided throughout unless specified otherwise  THEREX:   Circuit: 2 rounds of the following with 2.5# AW donned each LE- -ambulation 300 feet with 4WW -Seated hip march x 15 reps (patient rates as medium)  - Seated chair press ups x 10 reps (patient rates as medium) -STS 10x without UE  support  Circuit: 2 rounds with 2.5# AW donned each LE- -ambulation x 160 ft with 4WW -LAQ x 15 reps - Seated scap row x 10 reps  Circuit: 1 round of 2.5 AW donned each LE-  -ambulation x 160 feet -Seated LE swing to side up/over 1/2 spike ball x 10 reps -Sit to stand with overhead shoulder raise x 10 reps     HOME EXERCISE PROGRAM: pt to continue HEP as previously given  Access Code: [email or web address] URL: [email or web address] Date: 12/22/2022 Prepared by: Temple Pacini  Exercises - Sit to Stand with Counter Support  - 1 x daily - 5 x weekly - 3 sets - 10 reps  GOALS: Goals reviewed with patient? Yes  SHORT TERM GOALS: Target date: 04/27/2023    Patient will be independent in home exercise program to improve strength/mobility for better functional independence with ADLs. Baseline: LSVT BIG protocol to be initiated next session; 12/18: not yet fully indep, reports she still gets confused about the technique; 12/03/2022: HEP to be updated; Pt reports practicing HEP 2x/week; 02/23/23: not currently performing HEP, reviewed in session and provided new copy; 03/16/2023: pt reports performing HEP every day; 03/30/23: Pt reports she is not being consistent with her HEP Goal status: MET (previously MET)   LONG TERM GOALS: Target date: 06/08/2023     Patient will increase FOTO score to equal to or greater than 49 to demonstrate statistically significant improvement in mobility and quality of life.  Baseline: 40; 12/18: deferred due  to time; 45; 3/19: deferred to next visit; 01/21/2023; 02/23/2023: 46 unchanged from previous assessment); 03/17/2023: 48 Goal status: PARTIALLY MET  2.  Patient (> 71 years old) will complete five times sit to stand test in < 15 seconds indicating decreased fall risk.  Baseline: 27 sec; 12/18 23 sec hands-free; 11/04/22: 24 sec hands free; 2/1/124: 22.7 sec use of BUE, 21.3 sec hands-free; 01/19/23: 18 seconds hands-free; 02/23/23: 19 seconds hands-free;  03/16/2023: 18 seconds hands-free; 03/30/23: 17.3 seconds hands-free  Goal status: IN PROGRESS  3.  Patient will increase 10 meter walk test to >1.62m/s with WFL B step-length, upright posture for >90% of test to improve ease with gait and to override hypokentic and bradykinetic movement.  Baseline: 0.58 m/s with 1OX; 12/18: 0.7 m/s with 0RU; 11/04/22: 0.69 m/s with 0AV;  12/03/22: 0.73 m/s with 4UJ; 01/19/2023: 0.77 m/s with 8JX; 02/23/2023: 0.83 m/s with 9JY; 03/16/2023: 0.83 m/s with 7WG , pt still somewhat crouched, requires cues for upright posture >50% of time; 03/30/23: 0.83 m/s with crouched posture  Goal status: IN PROGRESS  4.  Patient will reduce time to don jacket by at least 5 seconds in order to increase ease with dressing and override bradykinetic movement Baseline: 17 seconds; 12/18: 12 seconds Goal status: MET  5.  Pt will demo or report ability to open standard jar using only 1-2 attempts in order to exhibit improved functional mobility for ADLs.  Baseline: 5.7 seconds with multiple attempts to turn jar lid to open; 12/18: 2.8 sec opens in 2 turns Goal status: MET  6.  Patient will increase Berg Balance score by > 6 points to demonstrate decreased fall risk during functional activities. Baseline: to be tested next 1-2 visits; 12/03/2022: 44/56; 01/21/23 45/56; 48/56; 03/17/2023: 48/56 Goal status: In Progress  7.  Patient will report at least 4 days in a row with minimal to no L hip pain in order to improve QOL and ease/safety with functional mobility. Baseline: Pt reports pain majority of days in L hip; 12/03/2022: pt reports having hip pain about every day; 3/19: rates hip pain daily (being seen now by MD, new imaging); 02/23/2023: pt went for period of time without hip pain (she thinks a week), but reports it has now come back,; 5/28: pt reports hip pain "every other day" Goal status: In Progress  8. Patient will increase six minute walk test distance to >1000 for progression to community  ambulator and improve gait ability Baseline:02/23/2023: 722 ft with 4WW and two standing rest breaks, rates test as hard; 03/16/2023: deferred to next visit due to time; 5/28/20204: 641 ft with 4WW, pt requests to end test early due to fatigue at 5 min 35 sec Goal status: IN PROGRESS  ASSESSMENT:  CLINICAL IMPRESSION: Patient arrived with good motivation to continue treatment focus on improving functional capacity/cardiorespiratory and LE endurance with circuit training. She continues to endorse more fatigue versus pain. She was able to complete all circuit rounds with VC for technique and some verbal encouragement. She was able to work within her pain limits well throughout session today.  The pt will benefit from further skilled PT to improve strength, balance, gait and mobility.  OBJECTIVE IMPAIRMENTS: Abnormal gait, decreased balance, decreased cognition, decreased coordination, decreased endurance, decreased knowledge of use of DME, decreased mobility, difficulty walking, decreased strength, dizziness, hypomobility, impaired UE functional use, improper body mechanics, postural dysfunction, and pain.   ACTIVITY LIMITATIONS: lifting, bending, standing, squatting, stairs, transfers, bed mobility, bathing, dressing, hygiene/grooming, and locomotion  level  PARTICIPATION LIMITATIONS: meal prep, cleaning, laundry, medication management, personal finances, driving, shopping, community activity, and yard work  PERSONAL FACTORS: Age, Fitness, Sex, Time since onset of injury/illness/exacerbation, and 3+ comorbidities: PMH includes hx of breast cancer, HTN, MCI, insomnia, cataracts, bipolar 1, alcohol use disorder, GAD, MDD, osteopenia of neck of R femur, chronic B low back pain with radiating pain to buttock and posterior thighs and calves. Pt with multiple comorbidities, please refer to chart for full details  are also affecting patient's functional outcome.   REHAB POTENTIAL: Fair    CLINICAL DECISION  MAKING: Evolving/moderate complexity  EVALUATION COMPLEXITY: High  PLAN:  PT FREQUENCY: 2x/week  PT DURATION: 12 weeks  PLANNED INTERVENTIONS: Therapeutic exercises, Therapeutic activity, Neuromuscular re-education, Balance training, Gait training, Patient/Family education, Self Care, Joint mobilization, Stair training, Vestibular training, Canalith repositioning, Visual/preceptual remediation/compensation, Orthotic/Fit training, DME instructions, Wheelchair mobility training, Spinal mobilization, Cryotherapy, Moist heat, Splintting, Taping, and Manual therapy  PLAN FOR NEXT SESSION: hamtrings loading, hip strength, mobility, endurance, gait and balance, cardio   7:55 AM, 04/07/23    Lenda Kelp, PT 04/07/2023, 7:55 AM  Physical Therapist - [facility]  Outpatient Physical Therapy- [facility] [phone]

## 2023-04-07 ENCOUNTER — Other Ambulatory Visit: Payer: Self-pay | Admitting: Psychiatry

## 2023-04-07 DIAGNOSIS — F411 Generalized anxiety disorder: Secondary | ICD-10-CM

## 2023-04-08 ENCOUNTER — Ambulatory Visit: Payer: Medicare PPO

## 2023-04-08 ENCOUNTER — Encounter: Payer: Self-pay | Admitting: Psychiatry

## 2023-04-08 ENCOUNTER — Ambulatory Visit (INDEPENDENT_AMBULATORY_CARE_PROVIDER_SITE_OTHER): Payer: Medicare PPO | Admitting: Psychiatry

## 2023-04-08 VITALS — BP 128/73 | HR 102 | Temp 97.4°F | Ht 66.0 in | Wt 221.4 lb

## 2023-04-08 DIAGNOSIS — R278 Other lack of coordination: Secondary | ICD-10-CM | POA: Diagnosis not present

## 2023-04-08 DIAGNOSIS — F3176 Bipolar disorder, in full remission, most recent episode depressed: Secondary | ICD-10-CM

## 2023-04-08 DIAGNOSIS — M25552 Pain in left hip: Secondary | ICD-10-CM | POA: Diagnosis not present

## 2023-04-08 DIAGNOSIS — F411 Generalized anxiety disorder: Secondary | ICD-10-CM | POA: Diagnosis not present

## 2023-04-08 DIAGNOSIS — M6281 Muscle weakness (generalized): Secondary | ICD-10-CM | POA: Diagnosis not present

## 2023-04-08 DIAGNOSIS — R262 Difficulty in walking, not elsewhere classified: Secondary | ICD-10-CM

## 2023-04-08 DIAGNOSIS — F5105 Insomnia due to other mental disorder: Secondary | ICD-10-CM

## 2023-04-08 DIAGNOSIS — R42 Dizziness and giddiness: Secondary | ICD-10-CM | POA: Diagnosis not present

## 2023-04-08 DIAGNOSIS — R2681 Unsteadiness on feet: Secondary | ICD-10-CM | POA: Diagnosis not present

## 2023-04-08 DIAGNOSIS — G20A1 Parkinson's disease without dyskinesia, without mention of fluctuations: Secondary | ICD-10-CM | POA: Diagnosis not present

## 2023-04-08 NOTE — Progress Notes (Signed)
BH MD OP Progress Note  04/08/2023 1:44 PM Kendra [name]  MRN:  [id number]  Chief Complaint:  Chief Complaint  Patient presents with   Follow-up   Depression   Anxiety   Medication Refill   HPI: Kendra [name] is a 77 year old Caucasian female, manage, retired, lives in [place], has a history of bipolar disorder, GAD, insomnia, hyperprolactinemia, multifocal stage II breast ca, status post lumpectomy, ovarian cyst removal, Parkinson's disease, bilateral hearing loss was evaluated in office today.  Spouse provided collateral information-[name], patient being a limited historian.  Overall per her spouse patient has had some trouble waking up this morning.  She is not a morning person.  She was confused this morning thought it was Friday instead of Thursday and contacted her Friday appointment contact person regarding the visit.  Patient although seems to be getting better as the day progresses continues to have some trouble with her cognition.  She gets like this if her schedule is disrupted in any way especially if she has to wake up too soon in the morning.  However overall otherwise he has not noticed any worsening memory problems.  She is likely maintaining.  She continues to follow-up with physical therapist.  She does have upcoming appointment with neurology.  She continues to be compliant on her medications for Parkinson's disease, carbidopa -levodopa.  That seems to be keeping her Parkinson's disease physical symptoms under control.  Patient is currently following a fairly okay diet.  She does not do any exercise other than the physical therapy.  Patient is compliant on her psychotropic medications like fluoxetine, risperidone, trazodone,.  The trazodone seems to help with the sleep.  Mood symptoms fairly stable on the current medication regimen.  Has not noticed any significant mood swings or depression.  Patient confirms the collateral information provided per spouse.  She agrees  with it.  Patient did not express any suicidality, homicidality or perceptual disturbances today.  Patient appeared to be alert, oriented to person place time.  She was able to state that month as June, the day as Thursday, the year is 2024.  3 word memory immediate 3 out of 3, after 5 minutes 1 out of 3.  She was able to spell 'WORLD' forward and backward.  Attention and focus seem to be good.  Denies any other concerns today.  Visit Diagnosis:    ICD-10-CM   1. Bipolar disorder, in full remission, most recent episode depressed (HCC)  F31.76     2. GAD (generalized anxiety disorder)  F41.1     3. Insomnia due to mental condition  F51.05    pain,mood      Past Psychiatric History: I have reviewed past psychiatric history from progress note on 03/01/2019.  Past trials of risperidone, Prozac, Seroquel, Ativan.  Past Medical History:  Past Medical History:  Diagnosis Date   Anxiety    Breast cancer (HCC) 03/2018   right breast cancer at 11:00 and 1:00   Bursitis    bilateral hips and knees   Complication of anesthesia    Depression    Family history of breast cancer 03/24/2018   Family history of lung cancer 03/24/2018   History of alcohol dependence (HCC) 2007   no ETOH; resolved since 2007   History of hiatal hernia    History of psychosis 2014   due to sleep disturbance   Hypertension    Parkinson disease    Personal history of radiation therapy 06/2018-07/2018   right breast ca  PONV (postoperative nausea and vomiting)     Past Surgical History:  Procedure Laterality Date   BREAST BIOPSY Right 03/14/2018   11:00 DCIS and invasive ductal carcinoma   BREAST BIOPSY Right 03/14/2018   1:00 Invasive ductal carcinoma   BREAST LUMPECTOMY Right 04/27/2018   lumpectomy of 11 and 1:00 cancers, clear margins, negative LN   BREAST LUMPECTOMY WITH RADIOACTIVE SEED AND SENTINEL LYMPH NODE BIOPSY Right 04/27/2018   Procedure: RIGHT BREAST LUMPECTOMY WITH RADIOACTIVE SEED X 2 AND RIGHT  SENTINEL LYMPH NODE BIOPSY;  Surgeon: Glenna Fellows, MD;  Location: [facility];  Service: General;  Laterality: Right;   CATARACT EXTRACTION W/PHACO Left 08/30/2019   Procedure: CATARACT EXTRACTION PHACO AND INTRAOCULAR LENS PLACEMENT (IOC) LEFT panoptix toric  01:20.5  12.7%  10.26;  Surgeon: Lockie Mola, MD;  Location[facility];  Service: Ophthalmology;  Laterality: Left;   CATARACT EXTRACTION W/PHACO Right 09/20/2019   Procedure: CATARACT EXTRACTION PHACO AND INTRAOCULAR LENS PLACEMENT (IOC) RIGHT 5.71  00:36.4  15.7%;  Surgeon: Lockie Mola, MD;  Location: [facility];  Service: Ophthalmology;  Laterality: Right;   LAPAROTOMY N/A 02/10/2018   Procedure: EXPLORATORY LAPAROTOMY;  Surgeon: Shonna Chock, MD;  Location: [facility];  Service: Gynecology;  Laterality: N/A;   MASS EXCISION  01/2018   abdominal   OVARIAN CYST REMOVAL     SALPINGOOPHORECTOMY Bilateral 02/10/2018   Procedure: BILATERAL SALPINGO OOPHORECTOMY; PERITONEAL WASHINGS;  Surgeon: Shonna Chock, MD;  Location: [facility];  Service: Gynecology;  Laterality: Bilateral;   TONSILLECTOMY     TONSILLECTOMY AND ADENOIDECTOMY  1953    Family Psychiatric History: I have reviewed family psychiatric history from progress note on 03/01/2019.  Family History:  Family History  Problem Relation Age of Onset   Diabetes Mother    Breast cancer Mother 49   Hypertension Mother    Lung cancer Father        asbestos exposure   Heart disease Brother 2   Breast cancer Cousin 28       paternal cousin   Heart disease Paternal Grandmother 55   Heart disease Paternal Grandfather     Social History: I have reviewed social history from progress note on 03/01/2019. Social History   Socioeconomic History   Marital status: Married    Spouse name: robert   Number of children: 0   Years of education: Not on file   Highest education level: Some college, no degree  Occupational History    Occupation: retired Insurance underwriter of education  Tobacco Use   Smoking status: Never   Smokeless tobacco: Never  Vaping Use   Vaping Use: Never used  Substance and Sexual Activity   Alcohol use: Not Currently    Comment: alcohol dependence prior to 2007   Drug use: Never   Sexual activity: Yes    Partners: Male    Birth control/protection: Surgical  Other Topics Concern   Not on file  Social History Narrative   Not on file   Social Determinants of Health   Financial Resource Strain: Low Risk  (02/25/2023)   Overall Financial Resource Strain (CARDIA)    Difficulty of Paying Living Expenses: Not hard at all  Food Insecurity: No Food Insecurity (08/29/2020)   Hunger Vital Sign    Worried About Running Out of Food in the Last Year: Never true    Ran Out of Food in the Last Year: Never true  Transportation Needs: No Transportation Needs (02/25/2023)  PRAPARE - Administrator, Civil Service (Medical): No    Lack of Transportation (Non-Medical): No  Physical Activity: Inactive (08/29/2020)   Exercise Vital Sign    Days of Exercise per Week: 0 days    Minutes of Exercise per Session: 0 min  Stress: Stress Concern Present (08/29/2020)   [employer] of Occupational Health - Occupational Stress Questionnaire    Feeling of Stress : To some extent  Social Connections: Moderately Integrated (08/29/2020)   Social Connection and Isolation Panel [NHANES]    Frequency of Communication with Friends and Family: More than three times a week    Frequency of Social Gatherings with Friends and Family: More than three times a week    Attends Religious Services: Never    Database administrator or Organizations: Yes    Attends Banker Meetings: 1 to 4 times per year    Marital Status: Married    Allergies:  Allergies  Allergen Reactions   Hydroxyzine Anaphylaxis    Tongue swollen   Other Other (See Comments)    Food poisoning    Shrimp [Shellfish  Allergy] Other (See Comments)    Food poisoning     Metabolic Disorder Labs: Lab Results  Component Value Date   HGBA1C 6.0 (H) 11/06/2022   Lab Results  Component Value Date   PROLACTIN 78.3 (H) 01/04/2023   PROLACTIN 70.5 (H) 05/12/2022   Lab Results  Component Value Date   CHOL 191 11/06/2022   TRIG 112 11/06/2022   HDL 58 11/06/2022   CHOLHDL 3.3 12/17/2021   LDLCALC 113 (H) 11/06/2022   LDLCALC 113 (H) 12/17/2021   Lab Results  Component Value Date   TSH 1.653 01/04/2023   TSH 2.31 04/13/2019    Therapeutic Level Labs: No results found for: "LITHIUM" No results found for: "VALPROATE" No results found for: "CBMZ"  Current Medications: Current Outpatient Medications  Medication Sig Dispense Refill   Acetaminophen 500 MG capsule Take 1,000 mg by mouth 2 (two) times daily.     AMBULATORY NON FORMULARY MEDICATION 1 Bag by Other route daily. HERO smart dispenser 1 Bag 0   carbidopa-levodopa (SINEMET IR) 25-100 MG tablet Take 2 tablets by mouth with breakfast, with lunch, and with evening meal.     Carbidopa-Levodopa ER (SINEMET CR) 25-100 MG tablet controlled release Take 2 tablets by mouth at bedtime.     cyanocobalamin (VITAMIN B12) 1000 MCG/ML injection INJECT 1 ML INTO THE MUSCLE EVERY 30 DAYS 3 mL 3   denosumab (PROLIA) 60 MG/ML SOSY injection Inject 60 mg into the skin every 6 (six) months. takes annually     FLUoxetine HCl 60 MG TABS TAKE 1 TABLET BY MOUTH DAILY 90 tablet 1   gabapentin (NEURONTIN) 100 MG capsule Take 200 mg by mouth 3 (three) times daily.     letrozole (FEMARA) 2.5 MG tablet Take 1 tablet (2.5 mg total) by mouth daily. 90 tablet 1   losartan-hydrochlorothiazide (HYZAAR) 50-12.5 MG tablet Take 1 tablet by mouth daily. Stop lisinopril-hctz - this replaced that medication 90 tablet 1   risperiDONE (RISPERDAL) 0.5 MG tablet Take 1 tablet (0.5 mg total) by mouth 2 (two) times daily. 180 tablet 1   traZODone (DESYREL) 100 MG tablet Take 1 tablet (100  mg total) by mouth at bedtime. 90 tablet 1   No current facility-administered medications for this visit.     Musculoskeletal: Strength & Muscle Tone: within normal limits Gait & Station:  Slow Patient leans:  N/A  Psychiatric Specialty Exam: Review of Systems  Unable to perform ROS: Psychiatric disorder    Blood pressure 128/73, pulse (!) 102, temperature (!) 97.4 F (36.3 C), temperature source Skin, height 5\' 6"  (1.676 m), weight 221 lb 6.4 oz (100.4 kg).Body mass index is 35.73 kg/m.  General Appearance: Casual  Eye Contact:  Fair  Speech:  Normal Rate  Volume:  Decreased  Mood:  Euthymic  Affect:  Restricted  Thought Process:  Goal Directed and Descriptions of Associations: Intact  Orientation:  Full (Time, Place, and Person)  Thought Content: Logical   Suicidal Thoughts:  No  Homicidal Thoughts:  No  Memory:  Immediate;   Fair Recent;   Fair Remote;   Poor  Judgement:  Fair  Insight:  Fair  Psychomotor Activity:  Normal  Concentration:  Concentration: Fair and Attention Span: Fair  Recall:  [employer] of Knowledge: Fair  Language: Fair  Akathisia:  No  Handed:  Right  AIMS (if indicated): done  Assets:  Communication Skills Desire for Improvement Housing Intimacy Social Support Talents/Skills Transportation  ADL's:  Intact  Cognition: WNL  Sleep:  Fair   Screenings: Midwife Visit from 04/08/2023 in [facility] Office Visit from 09/04/2022 in [facility] Office Visit from 06/04/2022 in [facility] Office Visit from 03/02/2022 in [facility] Office Visit from 12/01/2021 in [facility]  AIMS Total Score 0 0 0 0 0      GAD-7    Flowsheet Row Counselor from 03/25/2021 in [facility] Counselor from  03/28/2020 in [facility]  Total GAD-7 Score 4 7      PHQ2-9    Flowsheet Row Office Visit from 02/09/2023 in [facility] Office Visit from 01/26/2023 in [facility] Procedure visit from 01/12/2023 in [facility] Visit from 01/04/2023 in [facility] Office Visit from 12/15/2022 in [facility]  PHQ-2 Total Score 1 0 0 1 2  PHQ-9 Total Score 13 -- -- 8 9      Flowsheet Row Office Visit from 04/08/2023 in [facility] Office Visit from 01/04/2023 in [facility] Counselor from 09/28/2022 in [facility] at [facility] RISK CATEGORY No Risk No Risk No Risk        Assessment and Plan: Deriona Altemose is a 77 year old Caucasian female, married, lives in [place], has a history of bipolar disorder, GAD, Parkinson's disease was evaluated in office today.  Patient is currently stable.  Plan as noted below.  Plan Bipolar disorder in remission Risperidone 0.5 mg p.o. daily in the morning and 0.5 mg at bedtime Prozac 60 mg p.o. daily  GAD-stable Continue CBT Prozac 60 mg p.o. daily  Insomnia-stable Trazodone 100 mg p.o. nightly Melatonin as needed  I have reviewed labs-prolactin level 01/04/2023-78.3-likely due to being on risperidone.  Will consider dosage reduction in the future however patient will need this dosage for now.  NFA-2.130.  Within normal limits.  Will consider repeating prolactin level at next visit.   Collaboration of Care: Collaboration of Care: Collateral information obtained from spouse as noted above.  Patient/Guardian was advised Release of Information  must be obtained prior to any record release  in order to collaborate their care with an outside provider. Patient/Guardian was advised if they have not already done so to contact the registration department to sign all necessary forms in order for [place] to release information regarding their care.   Consent: Patient/Guardian gives verbal consent for treatment and assignment of benefits for services provided during this visit. Patient/Guardian expressed understanding and agreed to proceed.   This note was generated in part or whole with voice recognition software. Voice recognition is usually quite accurate but there are transcription errors that can and very often do occur. I apologize for any typographical errors that were not detected and corrected.    Jomarie Longs, MD 04/09/2023, 8:16 AM

## 2023-04-08 NOTE — Therapy (Signed)
OUTPATIENT NEURO PHYSICAL THERAPY TREATMENT    Patient Name: Kendra [name] MRN: [id number] DOB:04-05-1946, 77 y.o., female Today's Date: 04/08/2023  PCP: Erasmo Downer, MD REFERRING PROVIDER: Erasmo Downer, MD  END OF SESSION:  PT End of Session - 04/08/23 1354     Visit Number 43    Number of Visits 60    Date for PT Re-Evaluation 06/08/23    Authorization Type [employer] PPO    Authorization Time Period 11/04/2022-12/30/2022    Progress Note Due on Visit 30    PT Start Time 1302    PT Stop Time 1346    PT Time Calculation (min) 44 min    Equipment Utilized During Treatment Gait belt    Activity Tolerance Patient tolerated treatment well;Patient limited by fatigue    Behavior During Therapy WFL for tasks assessed/performed                        Past Medical History:  Diagnosis Date   Anxiety    Breast cancer (HCC) 03/2018   right breast cancer at 11:00 and 1:00   Bursitis    bilateral hips and knees   Complication of anesthesia    Depression    Family history of breast cancer 03/24/2018   Family history of lung cancer 03/24/2018   History of alcohol dependence (HCC) 2007   no ETOH; resolved since 2007   History of hiatal hernia    History of psychosis 2014   due to sleep disturbance   Hypertension    Parkinson disease    Personal history of radiation therapy 06/2018-07/2018   right breast ca   PONV (postoperative nausea and vomiting)    Past Surgical History:  Procedure Laterality Date   BREAST BIOPSY Right 03/14/2018   11:00 DCIS and invasive ductal carcinoma   BREAST BIOPSY Right 03/14/2018   1:00 Invasive ductal carcinoma   BREAST LUMPECTOMY Right 04/27/2018   lumpectomy of 11 and 1:00 cancers, clear margins, negative LN   BREAST LUMPECTOMY WITH RADIOACTIVE SEED AND SENTINEL LYMPH NODE BIOPSY Right 04/27/2018   Procedure: RIGHT BREAST LUMPECTOMY WITH RADIOACTIVE SEED X 2 AND RIGHT SENTINEL LYMPH NODE BIOPSY;  Surgeon:  Glenna Fellows, MD;  Location: [facility];  Service: General;  Laterality: Right;   CATARACT EXTRACTION W/PHACO Left 08/30/2019   Procedure: CATARACT EXTRACTION PHACO AND INTRAOCULAR LENS PLACEMENT (IOC) LEFT panoptix toric  01:20.5  12.7%  10.26;  Surgeon: Lockie Mola, MD;  Location: [facility];  Service: Ophthalmology;  Laterality: Left;   CATARACT EXTRACTION W/PHACO Right 09/20/2019   Procedure: CATARACT EXTRACTION PHACO AND INTRAOCULAR LENS PLACEMENT (IOC) RIGHT 5.71  00:36.4  15.7%;  Surgeon: Lockie Mola, MD;  Location: [facility];  Service: Ophthalmology;  Laterality: Right;   LAPAROTOMY N/A 02/10/2018   Procedure: EXPLORATORY LAPAROTOMY;  Surgeon: Shonna Chock, MD;  Location: [facility];  Service: Gynecology;  Laterality: N/A;   MASS EXCISION  01/2018   abdominal   OVARIAN CYST REMOVAL     SALPINGOOPHORECTOMY Bilateral 02/10/2018   Procedure: BILATERAL SALPINGO OOPHORECTOMY; PERITONEAL WASHINGS;  Surgeon: Shonna Chock, MD;  Location: [facility];  Service: Gynecology;  Laterality: Bilateral;   TONSILLECTOMY     TONSILLECTOMY AND ADENOIDECTOMY  1953   Patient Active Problem List   Diagnosis Date Noted   Prediabetes 12/15/2022   Chronic cough 08/17/2022   History of allergy to shellfish 02/24/2022   Chronic low back pain of over 3 months  duration 02/04/2022   Osteoarthritis of  AC (acromioclavicular) joint (Left) 02/04/2022   Osteoarthritis of hip (Right) 02/04/2022   Impaction fracture of distal end of radius and ulna, sequela (Left) 02/04/2022   Osteoarthritis of 1st (thumb) carpometacarpal (CMC) joint of hand (Left) 02/04/2022   Lumbosacral facet arthropathy (Multilevel) (Bilateral) (T12-S1) 02/04/2022   Lumbar central spinal stenosis w/o neurogenic claudication (L2-3, L3-4, L4-5) 02/04/2022   Lumbar foraminal stenosis (Bilateral: L2-3, L4-5) (Right: (Severe) L4-5) 02/04/2022   Lumbar lateral recess stenosis (Bilateral: L2-3,  L3-4, and L4-5) (Severe) 02/04/2022   DDD (degenerative disc disease), lumbar 02/04/2022   Lumbosacral facet syndrome (Multilevel) (Bilateral) 02/04/2022   Osteoarthritis of lumbar spine 02/04/2022   Allergic conjunctivitis of both eyes 12/23/2021   Chronic hip pain (3ry area of Pain) (Bilateral) (L>R) 12/22/2021   Chronic lower extremity pain (2ry area of Pain) (Bilateral) (L>R) 12/22/2021   Abnormal MRI, lumbar spine (01/31/2020) 12/22/2021   Grade 1 Anterolisthesis of lumbar spine (L3/L4) & (5 mm) (L4/L5) 12/22/2021   Chronic pain syndrome 11/17/2021   High risk medication use 11/17/2021   Disorder of skeletal system 11/17/2021   Problems influencing health status 11/17/2021   Other hyperlipidemia 10/21/2021   Polypharmacy 08/12/2021   Chronic low back pain (1ry area of Pain) (Bilateral) (L>R) w/o sciatica 06/18/2021   Osteopenia of neck of femur (Right) 09/06/2020   Obesity 08/29/2020   Parkinson disease 02/22/2020   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   Bipolar disorder, in full remission, most recent episode depressed (HCC) 11/08/2019   Vitamin D insufficiency 08/24/2019   Insomnia due to mental condition 05/31/2019   Bipolar I disorder, most recent episode depressed (HCC) 05/31/2019   Alcohol use disorder, moderate, in sustained remission (HCC) 05/31/2019   Elevated tumor markers 05/27/2019   GAD (generalized anxiety disorder) 04/10/2019   MDD (major depressive disorder), severe (HCC) 01/31/2019   High serum carbohydrate antigen 19-9 (CA19-9) 12/01/2018   Adjustment disorder with anxious mood 11/24/2018   Essential hypertension 11/24/2018   B12 deficiency 10/17/2018   Incisional hernia, without obstruction or gangrene 09/12/2018   Genetic testing 04/07/2018   Family history of breast cancer 03/24/2018   Family history of lung cancer 03/24/2018   Malignant neoplasm of upper-outer quadrant of right breast in female, estrogen receptor positive (HCC) 03/17/2018   Malignant  neoplasm of upper-inner quadrant of right breast in female, estrogen receptor positive (HCC) 03/17/2018   History of psychosis 02/07/2018   History of insomnia 02/07/2018    ONSET DATE: Pt diagnosed with PD in 2019  REFERRING DIAG:  G20 (ICD-10-CM) - Parkinson's disease      THERAPY DIAG:  Difficulty in walking, not elsewhere classified  Muscle weakness (generalized)  Other lack of coordination  Unsteadiness on feet  Pain in left hip  Rationale for Evaluation and Treatment: Rehabilitation  SUBJECTIVE: Pt reports continued L hip pain. She reports no stumbles/falls or other changes since previously seen.                                              Pain: L hip pain- Did not rate today.  Pt accompanied by: significant other self  PERTINENT HISTORY:  Pt is a pleasant 77 yo female who was diagnosed with PD in 2019. She is known to clinic and [facility], and was last seen for LSVT BIG Sept-Oct 2022. Pt with chronic pain and has become increasingly  sedentary as a result exacerbating mobility difficulties due to PD. Current concerns: Pt reports L and R hip pain and back pain. Pt was being seen by pain management clinic. Pt has had injections that provided relief at the time. Pt's spouse reports her physician determined surgery was not an option for her pain so the plan is to manage it. Pt spouse thinks pain has resulted in reluctance for pt to move. Pt currently sedentary, doesn't walk much, but when she does she holds on to furniture for support due to balance problems. When pt first stands she must hold on to something due to mix of pain and/or dizziness (described as lightheadedness). Her pain can go from 3/10 to 9/10. It does improve once she starts moving. Pt has increased difficulty with multiple activities including: getting in and out of her bed, getting in and out of her car, freezing, decreased gait speed, decreased balance and cognition, and difficulty using BUE due to tremors.  Per  chart other PMH includes hx of breast cancer, HTN, MCI, insomnia, cataracts, bipolar 1, alcohol use disorder, GAD, MDD, osteopenia of neck of R femur, chronic B low back pain with radiating pain to buttock and posterior thighs and calves. Pt with multiple comorbidities, please refer to chart for full details    PAIN: from eval Are you having pain? Yes: NPRS scale: not rated currently but can reach 9/10 Pain location: L hip and L leg Pain description: pt has difficulty describing Aggravating factors: unsure Relieving factors: laying in the recliner helps, movement  PRECAUTIONS: Fall  WEIGHT BEARING RESTRICTIONS: No  FALLS: Has patient fallen in last 6 months? Yes. Number of falls 1x, leg tangled in chair leg when getting up from a chair  PLOF: Independent with basic ADLs  PATIENT GOALS: improve mobility   BLE Strength Assessment      Right  Left  Hip Flexion 5/5 4/5 (painful)  Hip Horizontal ABDCT  5/5 5/5  Hip Horizontal ADD 4+/5 4+/5  Hip IR (seated)  4+/5 4+/5  Hip ER (seated) 5/5 5/5  Knee Extension 5/5 5/5  Knee Flexion (seated) 4+/5  5/5 (not painful)  Hip ABDCT (supine)  3+/5 4-/5   Focal popliteal edema Left knee, not painful   ROM Assessment Hip     Right  Left   Flexion Supine 132 120 (ouch)  External rotation (90/90) 30 47  Internal Rotation (90/90) 40  20  Supine Hip Extension P/ROM >0 degrees >0 degrees       IMAGING: via chart   07/24/22 DG BONE density " ASSESSMENT: The probability of a major osteoporotic fracture is 15.8% within the next ten years.   The probability of a hip fracture is 2.9% within the next ten years."   12/22/21 DG lumbar spine " IMPRESSION: 1. No acute displaced fracture or traumatic listhesis of the lumbar spine. 2. Interval development of grade 1 anterolisthesis of L3 on L4. Stable grade 1 anterolisthesis of L4 on L5. Appears stable on flexion extension views. 3.  Aortic Atherosclerosis (ICD10-I70.0)."   New from  chart:  DG Lumbar spine 01/05/23: "IMPRESSION: 1. Lumbar spine scoliosis concave right. Diffuse multilevel degenerative change. Disc degeneration most prominent at L2-L3 and L5-S1. 5 mm anterolisthesis L4 on L5. Diffuse severe facet hypertrophy. No flexion or extension abnormality identified. No evidence of fracture. 2. Sclerotic densities are noted in the L2 and L4 vertebral bodies. Although these could represent bone islands blastic metastatic disease cannot be excluded. MRI of the lumbar spine suggested  for further evaluation. 3. Large amount of stool noted throughout the colon. 4. Aortoiliac atherosclerotic vascular disease."    DG hip 01/05/23: "IMPRESSION: Moderate bilateral femoroacetabular osteoarthritis, similar to prior."   PATIENT EDUCATION:  Education details: Pt educated throughout session about proper posture and technique with exercises. Improved exercise technique, movement at target joints, use of target muscles after min to mod verbal, visual, tactile cues.  Person educated: Patient and Spouse Education method: Explanation, Demonstration, and Verbal cues Education comprehension: verbalized understanding, returned demonstration, verbal cues required, tactile cues required, and needs further education   TODAY'S TREATMENT:                                                                                                                                         DATE: 04/06/2023  Gait belt donned and CGA provided throughout unless specified otherwise  THEREX:   Ambulation for endurance with 4WW and 3# AW x 592 ft. Pt rates medium  Circuit: 2 rounds of the following with 3# AW donned each LE- -STS 11x without UE support -Seated hip march x 12 reps (patient rates as medium)  -Seated trunk twist with cross-body UE reach 12x each side  -ambulation 148 feet with 4WW, CGA   NMR: Circuit: 2 rounds with 3# AW donned each LE for additional resistance training with  balance Standing dynamic activity - standing side step with ipsilateral UE abduction 12x each side (UUE support) Standing dynamic activity - standing retro step with ipsilateral UE shoulder ext/reach 12x each side (UUE support) Standing NBOS with alt UE reach  12x each UE --Pt then repeats second round without UE support but with CGA    HOME EXERCISE PROGRAM: pt to continue HEP as previously given  Access Code: [email or web address] URL: [email or web address] Date: 12/22/2022 Prepared by: Temple Pacini  Exercises - Sit to Stand with Counter Support  - 1 x daily - 5 x weekly - 3 sets - 10 reps  GOALS: Goals reviewed with patient? Yes  SHORT TERM GOALS: Target date: 04/27/2023    Patient will be independent in home exercise program to improve strength/mobility for better functional independence with ADLs. Baseline: LSVT BIG protocol to be initiated next session; 12/18: not yet fully indep, reports she still gets confused about the technique; 12/03/2022: HEP to be updated; Pt reports practicing HEP 2x/week; 02/23/23: not currently performing HEP, reviewed in session and provided new copy; 03/16/2023: pt reports performing HEP every day; 03/30/23: Pt reports she is not being consistent with her HEP Goal status: MET (previously MET)   LONG TERM GOALS: Target date: 06/08/2023     Patient will increase FOTO score to equal to or greater than 49 to demonstrate statistically significant improvement in mobility and quality of life.  Baseline: 40; 12/18: deferred due to time; 45; 3/19: deferred to next visit; 01/21/2023; 02/23/2023: 46 unchanged from previous assessment); 03/17/2023: 48 Goal status: PARTIALLY MET  2.  Patient (> 16 years old) will complete five times sit to stand test in < 15 seconds indicating decreased fall risk.  Baseline: 27 sec; 12/18 23 sec hands-free; 11/04/22: 24 sec hands free; 2/1/124: 22.7 sec use of BUE, 21.3 sec hands-free; 01/19/23: 18 seconds hands-free; 02/23/23: 19  seconds hands-free; 03/16/2023: 18 seconds hands-free; 03/30/23: 17.3 seconds hands-free  Goal status: IN PROGRESS  3.  Patient will increase 10 meter walk test to >1.11m/s with WFL B step-length, upright posture for >90% of test to improve ease with gait and to override hypokentic and bradykinetic movement.  Baseline: 0.58 m/s with 4UJ; 12/18: 0.7 m/s with 8JX; 11/04/22: 0.69 m/s with 9JY;  12/03/22: 0.73 m/s with 7WG; 01/19/2023: 0.77 m/s with 9FA; 02/23/2023: 0.83 m/s with 2ZH; 03/16/2023: 0.83 m/s with 0QM , pt still somewhat crouched, requires cues for upright posture >50% of time; 03/30/23: 0.83 m/s with crouched posture  Goal status: IN PROGRESS  4.  Patient will reduce time to don jacket by at least 5 seconds in order to increase ease with dressing and override bradykinetic movement Baseline: 17 seconds; 12/18: 12 seconds Goal status: MET  5.  Pt will demo or report ability to open standard jar using only 1-2 attempts in order to exhibit improved functional mobility for ADLs.  Baseline: 5.7 seconds with multiple attempts to turn jar lid to open; 12/18: 2.8 sec opens in 2 turns Goal status: MET  6.  Patient will increase Berg Balance score by > 6 points to demonstrate decreased fall risk during functional activities. Baseline: to be tested next 1-2 visits; 12/03/2022: 44/56; 01/21/23 45/56; 48/56; 03/17/2023: 48/56 Goal status: In Progress  7.  Patient will report at least 4 days in a row with minimal to no L hip pain in order to improve QOL and ease/safety with functional mobility. Baseline: Pt reports pain majority of days in L hip; 12/03/2022: pt reports having hip pain about every day; 3/19: rates hip pain daily (being seen now by MD, new imaging); 02/23/2023: pt went for period of time without hip pain (she thinks a week), but reports it has now come back,; 5/28: pt reports hip pain "every other day" Goal status: In Progress  8. Patient will increase six minute walk test distance to >1000 for  progression to community ambulator and improve gait ability Baseline:02/23/2023: 722 ft with 4WW and two standing rest breaks, rates test as hard; 03/16/2023: deferred to next visit due to time; 5/28/20204: 641 ft with 4WW, pt requests to end test early due to fatigue at 5 min 35 sec Goal status: IN PROGRESS  ASSESSMENT:  CLINICAL IMPRESSION: Pt making gains AEB completing 592 of ambulation with 4WW without stopping. This indicates further improvement in cardio and LE mm endurance, although this is currently her limit/point where she requires a rest break. The pt will benefit from further skilled PT to improve strength, balance, gait and mobility.  OBJECTIVE IMPAIRMENTS: Abnormal gait, decreased balance, decreased cognition, decreased coordination, decreased endurance, decreased knowledge of use of DME, decreased mobility, difficulty walking, decreased strength, dizziness, hypomobility, impaired UE functional use, improper body mechanics, postural dysfunction, and pain.   ACTIVITY LIMITATIONS: lifting, bending, standing, squatting, stairs, transfers, bed mobility, bathing, dressing, hygiene/grooming, and locomotion level  PARTICIPATION LIMITATIONS: meal prep, cleaning, laundry, medication management, personal finances, driving, shopping, community activity, and yard work  PERSONAL FACTORS: Age, Fitness, Sex, Time since onset of injury/illness/exacerbation, and 3+ comorbidities: PMH includes hx of breast cancer, HTN, MCI, insomnia, cataracts, bipolar 1,  alcohol use disorder, GAD, MDD, osteopenia of neck of R femur, chronic B low back pain with radiating pain to buttock and posterior thighs and calves. Pt with multiple comorbidities, please refer to chart for full details  are also affecting patient's functional outcome.   REHAB POTENTIAL: Fair    CLINICAL DECISION MAKING: Evolving/moderate complexity  EVALUATION COMPLEXITY: High  PLAN:  PT FREQUENCY: 2x/week  PT DURATION: 12 weeks  PLANNED  INTERVENTIONS: Therapeutic exercises, Therapeutic activity, Neuromuscular re-education, Balance training, Gait training, Patient/Family education, Self Care, Joint mobilization, Stair training, Vestibular training, Canalith repositioning, Visual/preceptual remediation/compensation, Orthotic/Fit training, DME instructions, Wheelchair mobility training, Spinal mobilization, Cryotherapy, Moist heat, Splintting, Taping, and Manual therapy  PLAN FOR NEXT SESSION: hamtrings loading, hip strength, mobility, endurance, gait and balance, cardio   2:01 PM, 04/08/23    Baird Kay, PT 04/08/2023, 2:01 PM  Physical Therapist - [facility]  Outpatient Physical Therapy- Main Campus [phone]

## 2023-04-09 ENCOUNTER — Other Ambulatory Visit: Payer: Self-pay | Admitting: Internal Medicine

## 2023-04-09 DIAGNOSIS — C50411 Malignant neoplasm of upper-outer quadrant of right female breast: Secondary | ICD-10-CM

## 2023-04-13 ENCOUNTER — Ambulatory Visit: Payer: Medicare PPO

## 2023-04-13 DIAGNOSIS — R2681 Unsteadiness on feet: Secondary | ICD-10-CM

## 2023-04-13 DIAGNOSIS — M6281 Muscle weakness (generalized): Secondary | ICD-10-CM | POA: Diagnosis not present

## 2023-04-13 DIAGNOSIS — M25552 Pain in left hip: Secondary | ICD-10-CM | POA: Diagnosis not present

## 2023-04-13 DIAGNOSIS — R42 Dizziness and giddiness: Secondary | ICD-10-CM | POA: Diagnosis not present

## 2023-04-13 DIAGNOSIS — G20A1 Parkinson's disease without dyskinesia, without mention of fluctuations: Secondary | ICD-10-CM | POA: Diagnosis not present

## 2023-04-13 DIAGNOSIS — R262 Difficulty in walking, not elsewhere classified: Secondary | ICD-10-CM

## 2023-04-13 DIAGNOSIS — R278 Other lack of coordination: Secondary | ICD-10-CM | POA: Diagnosis not present

## 2023-04-13 NOTE — Therapy (Signed)
OUTPATIENT NEURO PHYSICAL THERAPY TREATMENT    Patient Name: Kendra [name] MRN: [id number] DOB:24-Mar-1946, 77 y.o., female Today's Date: 04/13/2023  PCP: Erasmo Downer, MD REFERRING PROVIDER: Erasmo Downer, MD  END OF SESSION:  PT End of Session - 04/13/23 1624     Visit Number 44    Number of Visits 60    Date for PT Re-Evaluation 06/08/23    Authorization Type [employer] PPO    Authorization Time Period 11/04/2022-12/30/2022    Progress Note Due on Visit 30    PT Start Time 1301    PT Stop Time 1343    PT Time Calculation (min) 42 min    Equipment Utilized During Treatment Gait belt    Activity Tolerance Patient tolerated treatment well;Patient limited by fatigue    Behavior During Therapy WFL for tasks assessed/performed                        Past Medical History:  Diagnosis Date   Anxiety    Breast cancer (HCC) 03/2018   right breast cancer at 11:00 and 1:00   Bursitis    bilateral hips and knees   Complication of anesthesia    Depression    Family history of breast cancer 03/24/2018   Family history of lung cancer 03/24/2018   History of alcohol dependence (HCC) 2007   no ETOH; resolved since 2007   History of hiatal hernia    History of psychosis 2014   due to sleep disturbance   Hypertension    Parkinson disease    Personal history of radiation therapy 06/2018-07/2018   right breast ca   PONV (postoperative nausea and vomiting)    Past Surgical History:  Procedure Laterality Date   BREAST BIOPSY Right 03/14/2018   11:00 DCIS and invasive ductal carcinoma   BREAST BIOPSY Right 03/14/2018   1:00 Invasive ductal carcinoma   BREAST LUMPECTOMY Right 04/27/2018   lumpectomy of 11 and 1:00 cancers, clear margins, negative LN   BREAST LUMPECTOMY WITH RADIOACTIVE SEED AND SENTINEL LYMPH NODE BIOPSY Right 04/27/2018   Procedure: RIGHT BREAST LUMPECTOMY WITH RADIOACTIVE SEED X 2 AND RIGHT SENTINEL LYMPH NODE BIOPSY;  Surgeon:  Glenna Fellows, MD;  Location: [facility];  Service: General;  Laterality: Right;   CATARACT EXTRACTION W/PHACO Left 08/30/2019   Procedure: CATARACT EXTRACTION PHACO AND INTRAOCULAR LENS PLACEMENT (IOC) LEFT panoptix toric  01:20.5  12.7%  10.26;  Surgeon: Lockie Mola, MD;  Location: [facility];  Service: Ophthalmology;  Laterality: Left;   CATARACT EXTRACTION W/PHACO Right 09/20/2019   Procedure: CATARACT EXTRACTION PHACO AND INTRAOCULAR LENS PLACEMENT (IOC) RIGHT 5.71  00:36.4  15.7%;  Surgeon: Lockie Mola, MD;  Location: [facility];  Service: Ophthalmology;  Laterality: Right;   LAPAROTOMY N/A 02/10/2018   Procedure: EXPLORATORY LAPAROTOMY;  Surgeon: Shonna Chock, MD;  Location: [facility];  Service: Gynecology;  Laterality: N/A;   MASS EXCISION  01/2018   abdominal   OVARIAN CYST REMOVAL     SALPINGOOPHORECTOMY Bilateral 02/10/2018   Procedure: BILATERAL SALPINGO OOPHORECTOMY; PERITONEAL WASHINGS;  Surgeon: Shonna Chock, MD;  Location: [facility];  Service: Gynecology;  Laterality: Bilateral;   TONSILLECTOMY     TONSILLECTOMY AND ADENOIDECTOMY  1953   Patient Active Problem List   Diagnosis Date Noted   Prediabetes 12/15/2022   Chronic cough 08/17/2022   History of allergy to shellfish 02/24/2022   Chronic low back pain of over 3 months  duration 02/04/2022   Osteoarthritis of  AC (acromioclavicular) joint (Left) 02/04/2022   Osteoarthritis of hip (Right) 02/04/2022   Impaction fracture of distal end of radius and ulna, sequela (Left) 02/04/2022   Osteoarthritis of 1st (thumb) carpometacarpal (CMC) joint of hand (Left) 02/04/2022   Lumbosacral facet arthropathy (Multilevel) (Bilateral) (T12-S1) 02/04/2022   Lumbar central spinal stenosis w/o neurogenic claudication (L2-3, L3-4, L4-5) 02/04/2022   Lumbar foraminal stenosis (Bilateral: L2-3, L4-5) (Right: (Severe) L4-5) 02/04/2022   Lumbar lateral recess stenosis (Bilateral: L2-3,  L3-4, and L4-5) (Severe) 02/04/2022   DDD (degenerative disc disease), lumbar 02/04/2022   Lumbosacral facet syndrome (Multilevel) (Bilateral) 02/04/2022   Osteoarthritis of lumbar spine 02/04/2022   Allergic conjunctivitis of both eyes 12/23/2021   Chronic hip pain (3ry area of Pain) (Bilateral) (L>R) 12/22/2021   Chronic lower extremity pain (2ry area of Pain) (Bilateral) (L>R) 12/22/2021   Abnormal MRI, lumbar spine (01/31/2020) 12/22/2021   Grade 1 Anterolisthesis of lumbar spine (L3/L4) & (5 mm) (L4/L5) 12/22/2021   Chronic pain syndrome 11/17/2021   High risk medication use 11/17/2021   Disorder of skeletal system 11/17/2021   Problems influencing health status 11/17/2021   Other hyperlipidemia 10/21/2021   Polypharmacy 08/12/2021   Chronic low back pain (1ry area of Pain) (Bilateral) (L>R) w/o sciatica 06/18/2021   Osteopenia of neck of femur (Right) 09/06/2020   Obesity 08/29/2020   Parkinson disease 02/22/2020   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   Bipolar disorder, in full remission, most recent episode depressed (HCC) 11/08/2019   Vitamin D insufficiency 08/24/2019   Insomnia due to mental condition 05/31/2019   Bipolar I disorder, most recent episode depressed (HCC) 05/31/2019   Alcohol use disorder, moderate, in sustained remission (HCC) 05/31/2019   Elevated tumor markers 05/27/2019   GAD (generalized anxiety disorder) 04/10/2019   MDD (major depressive disorder), severe (HCC) 01/31/2019   High serum carbohydrate antigen 19-9 (CA19-9) 12/01/2018   Adjustment disorder with anxious mood 11/24/2018   Essential hypertension 11/24/2018   B12 deficiency 10/17/2018   Incisional hernia, without obstruction or gangrene 09/12/2018   Genetic testing 04/07/2018   Family history of breast cancer 03/24/2018   Family history of lung cancer 03/24/2018   Malignant neoplasm of upper-outer quadrant of right breast in female, estrogen receptor positive (HCC) 03/17/2018   Malignant  neoplasm of upper-inner quadrant of right breast in female, estrogen receptor positive (HCC) 03/17/2018   History of psychosis 02/07/2018   History of insomnia 02/07/2018    ONSET DATE: Pt diagnosed with PD in 2019  REFERRING DIAG:  G20 (ICD-10-CM) - Parkinson's disease      THERAPY DIAG:  Muscle weakness (generalized)  Difficulty in walking, not elsewhere classified  Unsteadiness on feet  Dizziness and giddiness  Rationale for Evaluation and Treatment: Rehabilitation  SUBJECTIVE: Pt reports no pain at the moment. She reports no stumbles/falls. She has been dealing with some dizziness/lightheadedness.                                             Pain: no pain today Pt accompanied by: significant other self  PERTINENT HISTORY:  Pt is a pleasant 77 yo female who was diagnosed with PD in 2019. She is known to clinic and [facility], and was last seen for LSVT BIG Sept-Oct 2022. Pt with chronic pain and has become increasingly sedentary as a result exacerbating mobility difficulties due to PD.  Current concerns: Pt reports L and R hip pain and back pain. Pt was being seen by pain management clinic. Pt has had injections that provided relief at the time. Pt's spouse reports her physician determined surgery was not an option for her pain so the plan is to manage it. Pt spouse thinks pain has resulted in reluctance for pt to move. Pt currently sedentary, doesn't walk much, but when she does she holds on to furniture for support due to balance problems. When pt first stands she must hold on to something due to mix of pain and/or dizziness (described as lightheadedness). Her pain can go from 3/10 to 9/10. It does improve once she starts moving. Pt has increased difficulty with multiple activities including: getting in and out of her bed, getting in and out of her car, freezing, decreased gait speed, decreased balance and cognition, and difficulty using BUE due to tremors.  Per chart other PMH includes  hx of breast cancer, HTN, MCI, insomnia, cataracts, bipolar 1, alcohol use disorder, GAD, MDD, osteopenia of neck of R femur, chronic B low back pain with radiating pain to buttock and posterior thighs and calves. Pt with multiple comorbidities, please refer to chart for full details    PAIN: from eval Are you having pain? Yes: NPRS scale: not rated currently but can reach 9/10 Pain location: L hip and L leg Pain description: pt has difficulty describing Aggravating factors: unsure Relieving factors: laying in the recliner helps, movement  PRECAUTIONS: Fall  WEIGHT BEARING RESTRICTIONS: No  FALLS: Has patient fallen in last 6 months? Yes. Number of falls 1x, leg tangled in chair leg when getting up from a chair  PLOF: Independent with basic ADLs  PATIENT GOALS: improve mobility   BLE Strength Assessment      Right  Left  Hip Flexion 5/5 4/5 (painful)  Hip Horizontal ABDCT  5/5 5/5  Hip Horizontal ADD 4+/5 4+/5  Hip IR (seated)  4+/5 4+/5  Hip ER (seated) 5/5 5/5  Knee Extension 5/5 5/5  Knee Flexion (seated) 4+/5  5/5 (not painful)  Hip ABDCT (supine)  3+/5 4-/5   Focal popliteal edema Left knee, not painful   ROM Assessment Hip     Right  Left   Flexion Supine 132 120 (ouch)  External rotation (90/90) 30 47  Internal Rotation (90/90) 40  20  Supine Hip Extension P/ROM >0 degrees >0 degrees       IMAGING: via chart   07/24/22 DG BONE density " ASSESSMENT: The probability of a major osteoporotic fracture is 15.8% within the next ten years.   The probability of a hip fracture is 2.9% within the next ten years."   12/22/21 DG lumbar spine " IMPRESSION: 1. No acute displaced fracture or traumatic listhesis of the lumbar spine. 2. Interval development of grade 1 anterolisthesis of L3 on L4. Stable grade 1 anterolisthesis of L4 on L5. Appears stable on flexion extension views. 3.  Aortic Atherosclerosis (ICD10-I70.0)."   New from chart:  DG Lumbar spine 01/05/23:  "IMPRESSION: 1. Lumbar spine scoliosis concave right. Diffuse multilevel degenerative change. Disc degeneration most prominent at L2-L3 and L5-S1. 5 mm anterolisthesis L4 on L5. Diffuse severe facet hypertrophy. No flexion or extension abnormality identified. No evidence of fracture. 2. Sclerotic densities are noted in the L2 and L4 vertebral bodies. Although these could represent bone islands blastic metastatic disease cannot be excluded. MRI of the lumbar spine suggested for further evaluation. 3. Large amount of stool noted throughout  the colon. 4. Aortoiliac atherosclerotic vascular disease."    DG hip 01/05/23: "IMPRESSION: Moderate bilateral femoroacetabular osteoarthritis, similar to prior."   PATIENT EDUCATION:  Education details: Pt educated throughout session about proper posture and technique with exercises. Improved exercise technique, movement at target joints, use of target muscles after min to mod verbal, visual, tactile cues.  Person educated: Patient and Spouse Education method: Explanation, Demonstration, and Verbal cues Education comprehension: verbalized understanding, returned demonstration, verbal cues required, tactile cues required, and needs further education   TODAY'S TREATMENT:                                                                                                                                         DATE: 04/06/2023  Gait belt donned and CGA provided throughout unless specified otherwise  THEREX:   Ambulation for endurance with 4WW and 3# AW x 444 ft. Pt rates medium Rest break Ambulation for endurance with 4WW and 3# AW x 296 ft. Pt rates medium  Performed following attempted balance intervention Seated with 3# AW donned: LAQ 2x15 each LE March 2x12 each LE   STS 2x5. Terminated following second set as pt reports some L hip pain. Pt requests to end session for the day.  NMR: Attempted standing on airex at white board spelling out  alphabet but intervention terminated early as pt reports feeling hot/hot flash, requests to sit down. PT takes vitals HR: 94 bpm SPO2% 96-97% BP taken LUE: 147/75 mmHg HR 89 bpm   Elastogel donned to shoulders. Pt reports improvement in symptoms, but still feeling kind of hot.  Pt then continues with seated therex and STS (see above)   HOME EXERCISE PROGRAM: pt to continue HEP as previously given  Access Code: [email or web address] URL: [email or web address] Date: 12/22/2022 Prepared by: Temple Pacini  Exercises - Sit to Stand with Counter Support  - 1 x daily - 5 x weekly - 3 sets - 10 reps  GOALS: Goals reviewed with patient? Yes  SHORT TERM GOALS: Target date: 04/27/2023    Patient will be independent in home exercise program to improve strength/mobility for better functional independence with ADLs. Baseline: LSVT BIG protocol to be initiated next session; 12/18: not yet fully indep, reports she still gets confused about the technique; 12/03/2022: HEP to be updated; Pt reports practicing HEP 2x/week; 02/23/23: not currently performing HEP, reviewed in session and provided new copy; 03/16/2023: pt reports performing HEP every day; 03/30/23: Pt reports she is not being consistent with her HEP Goal status: MET (previously MET)   LONG TERM GOALS: Target date: 06/08/2023     Patient will increase FOTO score to equal to or greater than 49 to demonstrate statistically significant improvement in mobility and quality of life.  Baseline: 40; 12/18: deferred due to time; 45; 3/19: deferred to next visit; 01/21/2023; 02/23/2023: 46 unchanged from previous assessment); 03/17/2023: 48 Goal status: PARTIALLY MET  2.  Patient (> 42 years old) will complete five times sit to stand test in < 15 seconds indicating decreased fall risk.  Baseline: 27 sec; 12/18 23 sec hands-free; 11/04/22: 24 sec hands free; 2/1/124: 22.7 sec use of BUE, 21.3 sec hands-free; 01/19/23: 18 seconds hands-free; 02/23/23: 19  seconds hands-free; 03/16/2023: 18 seconds hands-free; 03/30/23: 17.3 seconds hands-free  Goal status: IN PROGRESS  3.  Patient will increase 10 meter walk test to >1.20m/s with WFL B step-length, upright posture for >90% of test to improve ease with gait and to override hypokentic and bradykinetic movement.  Baseline: 0.58 m/s with 1OX; 12/18: 0.7 m/s with 0RU; 11/04/22: 0.69 m/s with 0AV;  12/03/22: 0.73 m/s with 4UJ; 01/19/2023: 0.77 m/s with 8JX; 02/23/2023: 0.83 m/s with 9JY; 03/16/2023: 0.83 m/s with 7WG , pt still somewhat crouched, requires cues for upright posture >50% of time; 03/30/23: 0.83 m/s with crouched posture  Goal status: IN PROGRESS  4.  Patient will reduce time to don jacket by at least 5 seconds in order to increase ease with dressing and override bradykinetic movement Baseline: 17 seconds; 12/18: 12 seconds Goal status: MET  5.  Pt will demo or report ability to open standard jar using only 1-2 attempts in order to exhibit improved functional mobility for ADLs.  Baseline: 5.7 seconds with multiple attempts to turn jar lid to open; 12/18: 2.8 sec opens in 2 turns Goal status: MET  6.  Patient will increase Berg Balance score by > 6 points to demonstrate decreased fall risk during functional activities. Baseline: to be tested next 1-2 visits; 12/03/2022: 44/56; 01/21/23 45/56; 48/56; 03/17/2023: 48/56 Goal status: In Progress  7.  Patient will report at least 4 days in a row with minimal to no L hip pain in order to improve QOL and ease/safety with functional mobility. Baseline: Pt reports pain majority of days in L hip; 12/03/2022: pt reports having hip pain about every day; 3/19: rates hip pain daily (being seen now by MD, new imaging); 02/23/2023: pt went for period of time without hip pain (she thinks a week), but reports it has now come back,; 5/28: pt reports hip pain "every other day" Goal status: In Progress  8. Patient will increase six minute walk test distance to >1000 for  progression to community ambulator and improve gait ability Baseline:02/23/2023: 722 ft with 4WW and two standing rest breaks, rates test as hard; 03/16/2023: deferred to next visit due to time; 5/28/20204: 641 ft with 4WW, pt requests to end test early due to fatigue at 5 min 35 sec Goal status: IN PROGRESS  ASSESSMENT:  CLINICAL IMPRESSION: Pt more limited by fatigue than usual today and had to terminate balance activity due to additionally reporting hot flash. Vitals were taken and were WNL, and pt reported improvement with use of elastogel donned to bilat shoulders. PT, with pt's permission, did discuss pt's increased fatigue and hot flash with pt's caregiver. Pt's caregiver told PT she would inform pt's husband, and that she thinks pt may be feeling affected by her husband being out of town. The pt will benefit from further skilled PT to improve strength, balance, gait and mobility.  OBJECTIVE IMPAIRMENTS: Abnormal gait, decreased balance, decreased cognition, decreased coordination, decreased endurance, decreased knowledge of use of DME, decreased mobility, difficulty walking, decreased strength, dizziness, hypomobility, impaired UE functional use, improper body mechanics, postural dysfunction, and pain.   ACTIVITY LIMITATIONS: lifting, bending, standing, squatting, stairs, transfers, bed mobility, bathing, dressing, hygiene/grooming, and locomotion level  PARTICIPATION  LIMITATIONS: meal prep, cleaning, laundry, medication management, personal finances, driving, shopping, community activity, and yard work  PERSONAL FACTORS: Age, Fitness, Sex, Time since onset of injury/illness/exacerbation, and 3+ comorbidities: PMH includes hx of breast cancer, HTN, MCI, insomnia, cataracts, bipolar 1, alcohol use disorder, GAD, MDD, osteopenia of neck of R femur, chronic B low back pain with radiating pain to buttock and posterior thighs and calves. Pt with multiple comorbidities, please refer to chart for full  details  are also affecting patient's functional outcome.   REHAB POTENTIAL: Fair    CLINICAL DECISION MAKING: Evolving/moderate complexity  EVALUATION COMPLEXITY: High  PLAN:  PT FREQUENCY: 2x/week  PT DURATION: 12 weeks  PLANNED INTERVENTIONS: Therapeutic exercises, Therapeutic activity, Neuromuscular re-education, Balance training, Gait training, Patient/Family education, Self Care, Joint mobilization, Stair training, Vestibular training, Canalith repositioning, Visual/preceptual remediation/compensation, Orthotic/Fit training, DME instructions, Wheelchair mobility training, Spinal mobilization, Cryotherapy, Moist heat, Splintting, Taping, and Manual therapy  PLAN FOR NEXT SESSION: hamtrings loading, hip strength, mobility, endurance, gait and balance, cardio   4:28 PM, 04/13/23    Baird Kay, PT 04/13/2023, 4:28 PM  Physical Therapist - [facility]  Outpatient Physical Therapy- [facility] [phone]

## 2023-04-15 ENCOUNTER — Ambulatory Visit: Payer: Medicare PPO

## 2023-04-15 DIAGNOSIS — R262 Difficulty in walking, not elsewhere classified: Secondary | ICD-10-CM

## 2023-04-15 DIAGNOSIS — R42 Dizziness and giddiness: Secondary | ICD-10-CM | POA: Diagnosis not present

## 2023-04-15 DIAGNOSIS — M25552 Pain in left hip: Secondary | ICD-10-CM | POA: Diagnosis not present

## 2023-04-15 DIAGNOSIS — R2681 Unsteadiness on feet: Secondary | ICD-10-CM | POA: Diagnosis not present

## 2023-04-15 DIAGNOSIS — M6281 Muscle weakness (generalized): Secondary | ICD-10-CM

## 2023-04-15 DIAGNOSIS — G20A1 Parkinson's disease without dyskinesia, without mention of fluctuations: Secondary | ICD-10-CM | POA: Diagnosis not present

## 2023-04-15 DIAGNOSIS — R278 Other lack of coordination: Secondary | ICD-10-CM

## 2023-04-15 NOTE — Therapy (Signed)
OUTPATIENT NEURO PHYSICAL THERAPY TREATMENT    Patient Name: Kendra [name] MRN: [id number] DOB:1946-07-05, 77 y.o., female Today's Date: 04/15/2023  PCP: Erasmo Downer, MD REFERRING PROVIDER: Erasmo Downer, MD  END OF SESSION:  PT End of Session - 04/15/23 1803     Visit Number 45    Number of Visits 60    Date for PT Re-Evaluation 06/08/23    Authorization Type [employer] PPO    Authorization Time Period 11/04/2022-12/30/2022    Progress Note Due on Visit 30    PT Start Time 1302    PT Stop Time 1343    PT Time Calculation (min) 41 min    Equipment Utilized During Treatment Gait belt    Activity Tolerance Patient tolerated treatment well;Patient limited by fatigue    Behavior During Therapy [facility] for tasks assessed/performed                         Past Medical History:  Diagnosis Date   Anxiety    Breast cancer (HCC) 03/2018   right breast cancer at 11:00 and 1:00   Bursitis    bilateral hips and knees   Complication of anesthesia    Depression    Family history of breast cancer 03/24/2018   Family history of lung cancer 03/24/2018   History of alcohol dependence (HCC) 2007   no ETOH; resolved since 2007   History of hiatal hernia    History of psychosis 2014   due to sleep disturbance   Hypertension    Parkinson disease    Personal history of radiation therapy 06/2018-07/2018   right breast ca   PONV (postoperative nausea and vomiting)    Past Surgical History:  Procedure Laterality Date   BREAST BIOPSY Right 03/14/2018   11:00 DCIS and invasive ductal carcinoma   BREAST BIOPSY Right 03/14/2018   1:00 Invasive ductal carcinoma   BREAST LUMPECTOMY Right 04/27/2018   lumpectomy of 11 and 1:00 cancers, clear margins, negative LN   BREAST LUMPECTOMY WITH RADIOACTIVE SEED AND SENTINEL LYMPH NODE BIOPSY Right 04/27/2018   Procedure: RIGHT BREAST LUMPECTOMY WITH RADIOACTIVE SEED X 2 AND RIGHT SENTINEL LYMPH NODE BIOPSY;   Surgeon: Glenna Fellows, MD;  Location: [facility];  Service: General;  Laterality: Right;   CATARACT EXTRACTION W/PHACO Left 08/30/2019   Procedure: CATARACT EXTRACTION PHACO AND INTRAOCULAR LENS PLACEMENT (IOC) LEFT panoptix toric  01:20.5  12.7%  10.26;  Surgeon: Lockie Mola, MD;  Location: [facility];  Service: Ophthalmology;  Laterality: Left;   CATARACT EXTRACTION W/PHACO Right 09/20/2019   Procedure: CATARACT EXTRACTION PHACO AND INTRAOCULAR LENS PLACEMENT (IOC) RIGHT 5.71  00:36.4  15.7%;  Surgeon: Lockie Mola, MD;  Location: [facility];  Service: Ophthalmology;  Laterality: Right;   LAPAROTOMY N/A 02/10/2018   Procedure: EXPLORATORY LAPAROTOMY;  Surgeon: Shonna Chock, MD;  Location: [facility];  Service: Gynecology;  Laterality: N/A;   MASS EXCISION  01/2018   abdominal   OVARIAN CYST REMOVAL     SALPINGOOPHORECTOMY Bilateral 02/10/2018   Procedure: BILATERAL SALPINGO OOPHORECTOMY; PERITONEAL WASHINGS;  Surgeon: Shonna Chock, MD;  Location: [facility];  Service: Gynecology;  Laterality: Bilateral;   TONSILLECTOMY     TONSILLECTOMY AND ADENOIDECTOMY  1953   Patient Active Problem List   Diagnosis Date Noted   Prediabetes 12/15/2022   Chronic cough 08/17/2022   History of allergy to shellfish 02/24/2022   Chronic low back pain of over 3  months duration 02/04/2022   Osteoarthritis of  AC (acromioclavicular) joint (Left) 02/04/2022   Osteoarthritis of hip (Right) 02/04/2022   Impaction fracture of distal end of radius and ulna, sequela (Left) 02/04/2022   Osteoarthritis of 1st (thumb) carpometacarpal (CMC) joint of hand (Left) 02/04/2022   Lumbosacral facet arthropathy (Multilevel) (Bilateral) (T12-S1) 02/04/2022   Lumbar central spinal stenosis w/o neurogenic claudication (L2-3, L3-4, L4-5) 02/04/2022   Lumbar foraminal stenosis (Bilateral: L2-3, L4-5) (Right: (Severe) L4-5) 02/04/2022   Lumbar lateral recess stenosis (Bilateral:  L2-3, L3-4, and L4-5) (Severe) 02/04/2022   DDD (degenerative disc disease), lumbar 02/04/2022   Lumbosacral facet syndrome (Multilevel) (Bilateral) 02/04/2022   Osteoarthritis of lumbar spine 02/04/2022   Allergic conjunctivitis of both eyes 12/23/2021   Chronic hip pain (3ry area of Pain) (Bilateral) (L>R) 12/22/2021   Chronic lower extremity pain (2ry area of Pain) (Bilateral) (L>R) 12/22/2021   Abnormal MRI, lumbar spine (01/31/2020) 12/22/2021   Grade 1 Anterolisthesis of lumbar spine (L3/L4) & (5 mm) (L4/L5) 12/22/2021   Chronic pain syndrome 11/17/2021   High risk medication use 11/17/2021   Disorder of skeletal system 11/17/2021   Problems influencing health status 11/17/2021   Other hyperlipidemia 10/21/2021   Polypharmacy 08/12/2021   Chronic low back pain (1ry area of Pain) (Bilateral) (L>R) w/o sciatica 06/18/2021   Osteopenia of neck of femur (Right) 09/06/2020   Obesity 08/29/2020   Parkinson disease 02/22/2020   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   Bipolar disorder, in full remission, most recent episode depressed (HCC) 11/08/2019   Vitamin D insufficiency 08/24/2019   Insomnia due to mental condition 05/31/2019   Bipolar I disorder, most recent episode depressed (HCC) 05/31/2019   Alcohol use disorder, moderate, in sustained remission (HCC) 05/31/2019   Elevated tumor markers 05/27/2019   GAD (generalized anxiety disorder) 04/10/2019   MDD (major depressive disorder), severe (HCC) 01/31/2019   High serum carbohydrate antigen 19-9 (CA19-9) 12/01/2018   Adjustment disorder with anxious mood 11/24/2018   Essential hypertension 11/24/2018   B12 deficiency 10/17/2018   Incisional hernia, without obstruction or gangrene 09/12/2018   Genetic testing 04/07/2018   Family history of breast cancer 03/24/2018   Family history of lung cancer 03/24/2018   Malignant neoplasm of upper-outer quadrant of right breast in female, estrogen receptor positive (HCC) 03/17/2018   Malignant  neoplasm of upper-inner quadrant of right breast in female, estrogen receptor positive (HCC) 03/17/2018   History of psychosis 02/07/2018   History of insomnia 02/07/2018    ONSET DATE: Pt diagnosed with PD in 2019  REFERRING DIAG:  G20 (ICD-10-CM) - Parkinson's disease      THERAPY DIAG:  Muscle weakness (generalized)  Difficulty in walking, not elsewhere classified  Unsteadiness on feet  Other lack of coordination  Pain in left hip  Rationale for Evaluation and Treatment: Rehabilitation  SUBJECTIVE: Pt reports no pain at the moment. She reports no stumbles/falls. No other updates.                                             Pain: no pain today Pt accompanied by: significant other self  PERTINENT HISTORY:  Pt is a pleasant 77 yo female who was diagnosed with PD in 2019. She is known to clinic and [facility], and was last seen for LSVT BIG Sept-Oct 2022. Pt with chronic pain and has become increasingly sedentary as a result exacerbating mobility difficulties  due to PD. Current concerns: Pt reports L and R hip pain and back pain. Pt was being seen by pain management clinic. Pt has had injections that provided relief at the time. Pt's spouse reports her physician determined surgery was not an option for her pain so the plan is to manage it. Pt spouse thinks pain has resulted in reluctance for pt to move. Pt currently sedentary, doesn't walk much, but when she does she holds on to furniture for support due to balance problems. When pt first stands she must hold on to something due to mix of pain and/or dizziness (described as lightheadedness). Her pain can go from 3/10 to 9/10. It does improve once she starts moving. Pt has increased difficulty with multiple activities including: getting in and out of her bed, getting in and out of her car, freezing, decreased gait speed, decreased balance and cognition, and difficulty using BUE due to tremors.  Per chart other PMH includes hx of breast  cancer, HTN, MCI, insomnia, cataracts, bipolar 1, alcohol use disorder, GAD, MDD, osteopenia of neck of R femur, chronic B low back pain with radiating pain to buttock and posterior thighs and calves. Pt with multiple comorbidities, please refer to chart for full details    PAIN: from eval Are you having pain? Yes: NPRS scale: not rated currently but can reach 9/10 Pain location: L hip and L leg Pain description: pt has difficulty describing Aggravating factors: unsure Relieving factors: laying in the recliner helps, movement  PRECAUTIONS: Fall  WEIGHT BEARING RESTRICTIONS: No  FALLS: Has patient fallen in last 6 months? Yes. Number of falls 1x, leg tangled in chair leg when getting up from a chair  PLOF: Independent with basic ADLs  PATIENT GOALS: improve mobility   BLE Strength Assessment      Right  Left  Hip Flexion 5/5 4/5 (painful)  Hip Horizontal ABDCT  5/5 5/5  Hip Horizontal ADD 4+/5 4+/5  Hip IR (seated)  4+/5 4+/5  Hip ER (seated) 5/5 5/5  Knee Extension 5/5 5/5  Knee Flexion (seated) 4+/5  5/5 (not painful)  Hip ABDCT (supine)  3+/5 4-/5   Focal popliteal edema Left knee, not painful   ROM Assessment Hip     Right  Left   Flexion Supine 132 120 (ouch)  External rotation (90/90) 30 47  Internal Rotation (90/90) 40  20  Supine Hip Extension P/ROM >0 degrees >0 degrees       IMAGING: via chart   07/24/22 DG BONE density " ASSESSMENT: The probability of a major osteoporotic fracture is 15.8% within the next ten years.   The probability of a hip fracture is 2.9% within the next ten years."   12/22/21 DG lumbar spine " IMPRESSION: 1. No acute displaced fracture or traumatic listhesis of the lumbar spine. 2. Interval development of grade 1 anterolisthesis of L3 on L4. Stable grade 1 anterolisthesis of L4 on L5. Appears stable on flexion extension views. 3.  Aortic Atherosclerosis (ICD10-I70.0)."   New from chart:  DG Lumbar spine 01/05/23:  "IMPRESSION: 1. Lumbar spine scoliosis concave right. Diffuse multilevel degenerative change. Disc degeneration most prominent at L2-L3 and L5-S1. 5 mm anterolisthesis L4 on L5. Diffuse severe facet hypertrophy. No flexion or extension abnormality identified. No evidence of fracture. 2. Sclerotic densities are noted in the L2 and L4 vertebral bodies. Although these could represent bone islands blastic metastatic disease cannot be excluded. MRI of the lumbar spine suggested for further evaluation. 3. Large amount of  stool noted throughout the colon. 4. Aortoiliac atherosclerotic vascular disease."    DG hip 01/05/23: "IMPRESSION: Moderate bilateral femoroacetabular osteoarthritis, similar to prior."   PATIENT EDUCATION:  Education details: Pt educated throughout session about proper posture and technique with exercises. Improved exercise technique, movement at target joints, use of target muscles after min to mod verbal, visual, tactile cues.  Person educated: Patient and Spouse Education method: Explanation, Demonstration, and Verbal cues Education comprehension: verbalized understanding, returned demonstration, verbal cues required, tactile cues required, and needs further education   TODAY'S TREATMENT:                                                                                                                                         DATE: 04/15/23   Gait belt donned and CGA provided throughout unless specified otherwise  THEREX:   Ambulation for endurance with 4WW and 4# AW x 592 ft, CGA. Pt rates medium  4# ankle weights donned for the following: Circuit 1: STS 6x with 3# bar overhead press each rep  LAQ 1x15 each LE Standing trunk twists with BUE shoulder abduction 10x each side. Pt reports intervention is very fatiguing Ambulation for endurance with 4WW and 4# AW x 148 ft. Pt rates medium   4# ankle weights donned for the following: Circuit 1: STS 6x with 3# bar  overhead press each rep  LAQ 1x15 each LE Seated trunk twists with UE reach 8x with 3 sec hold/rep Ambulation for endurance with 4WW and 4# AW x 148 ft. Pt rates medium   4# ankle weights donned for the following: Circuit 1: STS 6x with 3# bar overhead press each rep - pt reports some L hip pain following intervention. Pain "eases off" when pt it seated Seated DF/PF 15x for each. Pt reports PF more difficult LAQ 1x15 each LE Ambulation for endurance with 4WW and 4# AW x 148 ft. Pt rates medium  Elastogel donned to shoulders per pt request (pt reports feeling hot following interventions). Pt reports this improves with use of elastogel.  Seated: DF 2x15 bilat PF 2x15 bilat  Seated march 2x10 each LE      HOME EXERCISE PROGRAM: pt to continue HEP as previously given  Access Code: [email or web address] URL: [email or web address] Date: 12/22/2022 Prepared by: Temple Pacini  Exercises - Sit to Stand with Counter Support  - 1 x daily - 5 x weekly - 3 sets - 10 reps  GOALS: Goals reviewed with patient? Yes  SHORT TERM GOALS: Target date: 04/27/2023    Patient will be independent in home exercise program to improve strength/mobility for better functional independence with ADLs. Baseline: LSVT BIG protocol to be initiated next session; 12/18: not yet fully indep, reports she still gets confused about the technique; 12/03/2022: HEP to be updated; Pt reports practicing HEP 2x/week; 02/23/23: not currently performing HEP, reviewed in session and provided new copy; 03/16/2023: pt reports  performing HEP every day; 03/30/23: Pt reports she is not being consistent with her HEP Goal status: MET (previously MET)   LONG TERM GOALS: Target date: 06/08/2023     Patient will increase FOTO score to equal to or greater than 49 to demonstrate statistically significant improvement in mobility and quality of life.  Baseline: 40; 12/18: deferred due to time; 45; 3/19: deferred to next visit;  01/21/2023; 02/23/2023: 46 unchanged from previous assessment); 03/17/2023: 48 Goal status: PARTIALLY MET  2.  Patient (> 59 years old) will complete five times sit to stand test in < 15 seconds indicating decreased fall risk.  Baseline: 27 sec; 12/18 23 sec hands-free; 11/04/22: 24 sec hands free; 2/1/124: 22.7 sec use of BUE, 21.3 sec hands-free; 01/19/23: 18 seconds hands-free; 02/23/23: 19 seconds hands-free; 03/16/2023: 18 seconds hands-free; 03/30/23: 17.3 seconds hands-free  Goal status: IN PROGRESS  3.  Patient will increase 10 meter walk test to >1.58m/s with WFL B step-length, upright posture for >90% of test to improve ease with gait and to override hypokentic and bradykinetic movement.  Baseline: 0.58 m/s with 1OX; 12/18: 0.7 m/s with 0RU; 11/04/22: 0.69 m/s with 0AV;  12/03/22: 0.73 m/s with 4UJ; 01/19/2023: 0.77 m/s with 8JX; 02/23/2023: 0.83 m/s with 9JY; 03/16/2023: 0.83 m/s with 7WG , pt still somewhat crouched, requires cues for upright posture >50% of time; 03/30/23: 0.83 m/s with crouched posture  Goal status: IN PROGRESS  4.  Patient will reduce time to don jacket by at least 5 seconds in order to increase ease with dressing and override bradykinetic movement Baseline: 17 seconds; 12/18: 12 seconds Goal status: MET  5.  Pt will demo or report ability to open standard jar using only 1-2 attempts in order to exhibit improved functional mobility for ADLs.  Baseline: 5.7 seconds with multiple attempts to turn jar lid to open; 12/18: 2.8 sec opens in 2 turns Goal status: MET  6.  Patient will increase Berg Balance score by > 6 points to demonstrate decreased fall risk during functional activities. Baseline: to be tested next 1-2 visits; 12/03/2022: 44/56; 01/21/23 45/56; 48/56; 03/17/2023: 48/56 Goal status: In Progress  7.  Patient will report at least 4 days in a row with minimal to no L hip pain in order to improve QOL and ease/safety with functional mobility. Baseline: Pt reports pain majority  of days in L hip; 12/03/2022: pt reports having hip pain about every day; 3/19: rates hip pain daily (being seen now by MD, new imaging); 02/23/2023: pt went for period of time without hip pain (she thinks a week), but reports it has now come back,; 5/28: pt reports hip pain "every other day" Goal status: In Progress  8. Patient will increase six minute walk test distance to >1000 for progression to community ambulator and improve gait ability Baseline:02/23/2023: 722 ft with 4WW and two standing rest breaks, rates test as hard; 03/16/2023: deferred to next visit due to time; 5/28/20204: 641 ft with 4WW, pt requests to end test early due to fatigue at 5 min 35 sec Goal status: IN PROGRESS  ASSESSMENT:  CLINICAL IMPRESSION: Pt able to progress cardiorespiratory and LE mm endurance/functional capacity interventions on this date, although pt still quick to fatigue. PT must provide cues throughout for pt to maintain proximity to RW to control gait speed and prevent early onset of fatigue. The pt will benefit from further skilled PT to improve strength, balance, gait and mobility.  OBJECTIVE IMPAIRMENTS: Abnormal gait, decreased balance, decreased cognition,  decreased coordination, decreased endurance, decreased knowledge of use of DME, decreased mobility, difficulty walking, decreased strength, dizziness, hypomobility, impaired UE functional use, improper body mechanics, postural dysfunction, and pain.   ACTIVITY LIMITATIONS: lifting, bending, standing, squatting, stairs, transfers, bed mobility, bathing, dressing, hygiene/grooming, and locomotion level  PARTICIPATION LIMITATIONS: meal prep, cleaning, laundry, medication management, personal finances, driving, shopping, community activity, and yard work  PERSONAL FACTORS: Age, Fitness, Sex, Time since onset of injury/illness/exacerbation, and 3+ comorbidities: PMH includes hx of breast cancer, HTN, MCI, insomnia, cataracts, bipolar 1, alcohol use disorder,  GAD, MDD, osteopenia of neck of R femur, chronic B low back pain with radiating pain to buttock and posterior thighs and calves. Pt with multiple comorbidities, please refer to chart for full details  are also affecting patient's functional outcome.   REHAB POTENTIAL: Fair    CLINICAL DECISION MAKING: Evolving/moderate complexity  EVALUATION COMPLEXITY: High  PLAN:  PT FREQUENCY: 2x/week  PT DURATION: 12 weeks  PLANNED INTERVENTIONS: Therapeutic exercises, Therapeutic activity, Neuromuscular re-education, Balance training, Gait training, Patient/Family education, Self Care, Joint mobilization, Stair training, Vestibular training, Canalith repositioning, Visual/preceptual remediation/compensation, Orthotic/Fit training, DME instructions, Wheelchair mobility training, Spinal mobilization, Cryotherapy, Moist heat, Splintting, Taping, and Manual therapy  PLAN FOR NEXT SESSION: hamtrings loading, hip strength, mobility, endurance, gait and balance, cardio   6:06 PM, 04/15/23    Baird Kay, PT 04/15/2023, 6:06 PM  Physical Therapist - [facility]  Outpatient Physical Therapy- Main Campus [phone]

## 2023-04-20 ENCOUNTER — Ambulatory Visit: Payer: Medicare PPO

## 2023-04-20 DIAGNOSIS — R42 Dizziness and giddiness: Secondary | ICD-10-CM | POA: Diagnosis not present

## 2023-04-20 DIAGNOSIS — R262 Difficulty in walking, not elsewhere classified: Secondary | ICD-10-CM

## 2023-04-20 DIAGNOSIS — M25552 Pain in left hip: Secondary | ICD-10-CM | POA: Diagnosis not present

## 2023-04-20 DIAGNOSIS — R2681 Unsteadiness on feet: Secondary | ICD-10-CM | POA: Diagnosis not present

## 2023-04-20 DIAGNOSIS — R278 Other lack of coordination: Secondary | ICD-10-CM | POA: Diagnosis not present

## 2023-04-20 DIAGNOSIS — M6281 Muscle weakness (generalized): Secondary | ICD-10-CM

## 2023-04-20 DIAGNOSIS — G20A1 Parkinson's disease without dyskinesia, without mention of fluctuations: Secondary | ICD-10-CM | POA: Diagnosis not present

## 2023-04-20 NOTE — Therapy (Signed)
OUTPATIENT NEURO PHYSICAL THERAPY TREATMENT    Patient Name: Kendra [name] MRN: [id number] DOB:February 08, 1946, 77 y.o., female Today's Date: 04/20/2023  PCP: Erasmo Downer, MD REFERRING PROVIDER: Erasmo Downer, MD  END OF SESSION:  PT End of Session - 04/20/23 1304     Visit Number 46    Number of Visits 60    Date for PT Re-Evaluation 06/08/23    Authorization Type [employer] PPO    Authorization Time Period 11/04/2022-12/30/2022    Progress Note Due on Visit 30    PT Start Time 1302    PT Stop Time 1342    PT Time Calculation (min) 40 min    Equipment Utilized During Treatment Gait belt    Activity Tolerance Patient tolerated treatment well;Patient limited by fatigue    Behavior During Therapy WFL for tasks assessed/performed                         Past Medical History:  Diagnosis Date   Anxiety    Breast cancer (HCC) 03/2018   right breast cancer at 11:00 and 1:00   Bursitis    bilateral hips and knees   Complication of anesthesia    Depression    Family history of breast cancer 03/24/2018   Family history of lung cancer 03/24/2018   History of alcohol dependence (HCC) 2007   no ETOH; resolved since 2007   History of hiatal hernia    History of psychosis 2014   due to sleep disturbance   Hypertension    Parkinson disease    Personal history of radiation therapy 06/2018-07/2018   right breast ca   PONV (postoperative nausea and vomiting)    Past Surgical History:  Procedure Laterality Date   BREAST BIOPSY Right 03/14/2018   11:00 DCIS and invasive ductal carcinoma   BREAST BIOPSY Right 03/14/2018   1:00 Invasive ductal carcinoma   BREAST LUMPECTOMY Right 04/27/2018   lumpectomy of 11 and 1:00 cancers, clear margins, negative LN   BREAST LUMPECTOMY WITH RADIOACTIVE SEED AND SENTINEL LYMPH NODE BIOPSY Right 04/27/2018   Procedure: RIGHT BREAST LUMPECTOMY WITH RADIOACTIVE SEED X 2 AND RIGHT SENTINEL LYMPH NODE BIOPSY;   Surgeon: Glenna Fellows, MD;  Location: [facility];  Service: General;  Laterality: Right;   CATARACT EXTRACTION W/PHACO Left 08/30/2019   Procedure: CATARACT EXTRACTION PHACO AND INTRAOCULAR LENS PLACEMENT (IOC) LEFT panoptix toric  01:20.5  12.7%  10.26;  Surgeon: Lockie Mola, MD;  Location: [facility];  Service: Ophthalmology;  Laterality: Left;   CATARACT EXTRACTION W/PHACO Right 09/20/2019   Procedure: CATARACT EXTRACTION PHACO AND INTRAOCULAR LENS PLACEMENT (IOC) RIGHT 5.71  00:36.4  15.7%;  Surgeon: Lockie Mola, MD;  Location: [facility];  Service: Ophthalmology;  Laterality: Right;   LAPAROTOMY N/A 02/10/2018   Procedure: EXPLORATORY LAPAROTOMY;  Surgeon: Shonna Chock, MD;  Location: [facility];  Service: Gynecology;  Laterality: N/A;   MASS EXCISION  01/2018   abdominal   OVARIAN CYST REMOVAL     SALPINGOOPHORECTOMY Bilateral 02/10/2018   Procedure: BILATERAL SALPINGO OOPHORECTOMY; PERITONEAL WASHINGS;  Surgeon: Shonna Chock, MD;  Location: [facility];  Service: Gynecology;  Laterality: Bilateral;   TONSILLECTOMY     TONSILLECTOMY AND ADENOIDECTOMY  1953   Patient Active Problem List   Diagnosis Date Noted   Prediabetes 12/15/2022   Chronic cough 08/17/2022   History of allergy to shellfish 02/24/2022   Chronic low back pain of over 3  months duration 02/04/2022   Osteoarthritis of  AC (acromioclavicular) joint (Left) 02/04/2022   Osteoarthritis of hip (Right) 02/04/2022   Impaction fracture of distal end of radius and ulna, sequela (Left) 02/04/2022   Osteoarthritis of 1st (thumb) carpometacarpal (CMC) joint of hand (Left) 02/04/2022   Lumbosacral facet arthropathy (Multilevel) (Bilateral) (T12-S1) 02/04/2022   Lumbar central spinal stenosis w/o neurogenic claudication (L2-3, L3-4, L4-5) 02/04/2022   Lumbar foraminal stenosis (Bilateral: L2-3, L4-5) (Right: (Severe) L4-5) 02/04/2022   Lumbar lateral recess stenosis (Bilateral:  L2-3, L3-4, and L4-5) (Severe) 02/04/2022   DDD (degenerative disc disease), lumbar 02/04/2022   Lumbosacral facet syndrome (Multilevel) (Bilateral) 02/04/2022   Osteoarthritis of lumbar spine 02/04/2022   Allergic conjunctivitis of both eyes 12/23/2021   Chronic hip pain (3ry area of Pain) (Bilateral) (L>R) 12/22/2021   Chronic lower extremity pain (2ry area of Pain) (Bilateral) (L>R) 12/22/2021   Abnormal MRI, lumbar spine (01/31/2020) 12/22/2021   Grade 1 Anterolisthesis of lumbar spine (L3/L4) & (5 mm) (L4/L5) 12/22/2021   Chronic pain syndrome 11/17/2021   High risk medication use 11/17/2021   Disorder of skeletal system 11/17/2021   Problems influencing health status 11/17/2021   Other hyperlipidemia 10/21/2021   Polypharmacy 08/12/2021   Chronic low back pain (1ry area of Pain) (Bilateral) (L>R) w/o sciatica 06/18/2021   Osteopenia of neck of femur (Right) 09/06/2020   Obesity 08/29/2020   Parkinson disease 02/22/2020   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   Bipolar disorder, in full remission, most recent episode depressed (HCC) 11/08/2019   Vitamin D insufficiency 08/24/2019   Insomnia due to mental condition 05/31/2019   Bipolar I disorder, most recent episode depressed (HCC) 05/31/2019   Alcohol use disorder, moderate, in sustained remission (HCC) 05/31/2019   Elevated tumor markers 05/27/2019   GAD (generalized anxiety disorder) 04/10/2019   MDD (major depressive disorder), severe (HCC) 01/31/2019   High serum carbohydrate antigen 19-9 (CA19-9) 12/01/2018   Adjustment disorder with anxious mood 11/24/2018   Essential hypertension 11/24/2018   B12 deficiency 10/17/2018   Incisional hernia, without obstruction or gangrene 09/12/2018   Genetic testing 04/07/2018   Family history of breast cancer 03/24/2018   Family history of lung cancer 03/24/2018   Malignant neoplasm of upper-outer quadrant of right breast in female, estrogen receptor positive (HCC) 03/17/2018   Malignant  neoplasm of upper-inner quadrant of right breast in female, estrogen receptor positive (HCC) 03/17/2018   History of psychosis 02/07/2018   History of insomnia 02/07/2018    ONSET DATE: Pt diagnosed with PD in 2019  REFERRING DIAG:  G20 (ICD-10-CM) - Parkinson's disease      THERAPY DIAG:  Muscle weakness (generalized)  Difficulty in walking, not elsewhere classified  Unsteadiness on feet  Rationale for Evaluation and Treatment: Rehabilitation  SUBJECTIVE: Pt reports no L hip pain at the moment. She reports no stumbles/falls, no other changes or concerns.                                              Pain: no pain today Pt accompanied by: significant other self  PERTINENT HISTORY:  Pt is a pleasant 77 yo female who was diagnosed with PD in 2019. She is known to clinic and [facility], and was last seen for LSVT BIG Sept-Oct 2022. Pt with chronic pain and has become increasingly sedentary as a result exacerbating mobility difficulties due to PD. Current concerns:  Pt reports L and R hip pain and back pain. Pt was being seen by pain management clinic. Pt has had injections that provided relief at the time. Pt's spouse reports her physician determined surgery was not an option for her pain so the plan is to manage it. Pt spouse thinks pain has resulted in reluctance for pt to move. Pt currently sedentary, doesn't walk much, but when she does she holds on to furniture for support due to balance problems. When pt first stands she must hold on to something due to mix of pain and/or dizziness (described as lightheadedness). Her pain can go from 3/10 to 9/10. It does improve once she starts moving. Pt has increased difficulty with multiple activities including: getting in and out of her bed, getting in and out of her car, freezing, decreased gait speed, decreased balance and cognition, and difficulty using BUE due to tremors.  Per chart other PMH includes hx of breast cancer, HTN, MCI, insomnia,  cataracts, bipolar 1, alcohol use disorder, GAD, MDD, osteopenia of neck of R femur, chronic B low back pain with radiating pain to buttock and posterior thighs and calves. Pt with multiple comorbidities, please refer to chart for full details    PAIN: from eval Are you having pain? Yes: NPRS scale: not rated currently but can reach 9/10 Pain location: L hip and L leg Pain description: pt has difficulty describing Aggravating factors: unsure Relieving factors: laying in the recliner helps, movement  PRECAUTIONS: Fall  WEIGHT BEARING RESTRICTIONS: No  FALLS: Has patient fallen in last 6 months? Yes. Number of falls 1x, leg tangled in chair leg when getting up from a chair  PLOF: Independent with basic ADLs  PATIENT GOALS: improve mobility   BLE Strength Assessment      Right  Left  Hip Flexion 5/5 4/5 (painful)  Hip Horizontal ABDCT  5/5 5/5  Hip Horizontal ADD 4+/5 4+/5  Hip IR (seated)  4+/5 4+/5  Hip ER (seated) 5/5 5/5  Knee Extension 5/5 5/5  Knee Flexion (seated) 4+/5  5/5 (not painful)  Hip ABDCT (supine)  3+/5 4-/5   Focal popliteal edema Left knee, not painful   ROM Assessment Hip     Right  Left   Flexion Supine 132 120 (ouch)  External rotation (90/90) 30 47  Internal Rotation (90/90) 40  20  Supine Hip Extension P/ROM >0 degrees >0 degrees       IMAGING: via chart   07/24/22 DG BONE density " ASSESSMENT: The probability of a major osteoporotic fracture is 15.8% within the next ten years.   The probability of a hip fracture is 2.9% within the next ten years."   12/22/21 DG lumbar spine " IMPRESSION: 1. No acute displaced fracture or traumatic listhesis of the lumbar spine. 2. Interval development of grade 1 anterolisthesis of L3 on L4. Stable grade 1 anterolisthesis of L4 on L5. Appears stable on flexion extension views. 3.  Aortic Atherosclerosis (ICD10-I70.0)."   New from chart:  DG Lumbar spine 01/05/23: "IMPRESSION: 1. Lumbar spine scoliosis  concave right. Diffuse multilevel degenerative change. Disc degeneration most prominent at L2-L3 and L5-S1. 5 mm anterolisthesis L4 on L5. Diffuse severe facet hypertrophy. No flexion or extension abnormality identified. No evidence of fracture. 2. Sclerotic densities are noted in the L2 and L4 vertebral bodies. Although these could represent bone islands blastic metastatic disease cannot be excluded. MRI of the lumbar spine suggested for further evaluation. 3. Large amount of stool noted throughout the colon.  4. Aortoiliac atherosclerotic vascular disease."    DG hip 01/05/23: "IMPRESSION: Moderate bilateral femoroacetabular osteoarthritis, similar to prior."   PATIENT EDUCATION:  Education details: Pt educated throughout session about proper posture and technique with exercises. Improved exercise technique, movement at target joints, use of target muscles after min to mod verbal, visual, tactile cues.  Person educated: Patient and Spouse Education method: Explanation, Demonstration, and Verbal cues Education comprehension: verbalized understanding, returned demonstration, verbal cues required, tactile cues required, and needs further education   TODAY'S TREATMENT:                                                                                                                                         DATE: 04/20/23   Gait belt donned and CGA provided throughout unless specified otherwise  THEREX:   Ambulation for endurance with 4WW and 4# AW x 740 ft, CGA. Pt rates medium. One brief standing rest break   4# ankle weights donned for the following: Circuit 1: STS 8x with 3# bar overhead press each rep  LAQ 1x15 each LE Standing trunk twists with BUE shoulder abduction 10x each side. Pt reports intervention continues to be fatiguing, did repot some L hip discomfort Ambulation for endurance with 4WW and 4# AW x 148 ft. Pt rates medium  4# ankle weights donned for the  following: Circuit 1: STS 8x with 3# bar overhead press each rep  Semi-tandem with alt UE reaching 5x in each LE position LAQ 1x10 each LE Seated trunk twists with UE reach 8x with 3 sec hold/rep  4# ankle weights donned for the following: Circuit 1: STS 10x with 3# bar overhead press each rep Seated ankle rocker 20x bilat LAQ 1x20 each LE Ambulation for endurance with 4WW and 4# AW x 148 ft. Pt rates medium  STS 10x     HOME EXERCISE PROGRAM: pt to continue HEP as previously given  Access Code: [email or web address] URL: [email or web address] Date: 12/22/2022 Prepared by: Temple Pacini  Exercises - Sit to Stand with Counter Support  - 1 x daily - 5 x weekly - 3 sets - 10 reps  GOALS: Goals reviewed with patient? Yes  SHORT TERM GOALS: Target date: 04/27/2023    Patient will be independent in home exercise program to improve strength/mobility for better functional independence with ADLs. Baseline: LSVT BIG protocol to be initiated next session; 12/18: not yet fully indep, reports she still gets confused about the technique; 12/03/2022: HEP to be updated; Pt reports practicing HEP 2x/week; 02/23/23: not currently performing HEP, reviewed in session and provided new copy; 03/16/2023: pt reports performing HEP every day; 03/30/23: Pt reports she is not being consistent with her HEP Goal status: MET (previously MET)   LONG TERM GOALS: Target date: 06/08/2023     Patient will increase FOTO score to equal to or greater than 49 to demonstrate statistically significant improvement in mobility and quality  of life.  Baseline: 40; 12/18: deferred due to time; 45; 3/19: deferred to next visit; 01/21/2023; 02/23/2023: 46 unchanged from previous assessment); 03/17/2023: 48 Goal status: PARTIALLY MET  2.  Patient (> 13 years old) will complete five times sit to stand test in < 15 seconds indicating decreased fall risk.  Baseline: 27 sec; 12/18 23 sec hands-free; 11/04/22: 24 sec hands free;  2/1/124: 22.7 sec use of BUE, 21.3 sec hands-free; 01/19/23: 18 seconds hands-free; 02/23/23: 19 seconds hands-free; 03/16/2023: 18 seconds hands-free; 03/30/23: 17.3 seconds hands-free  Goal status: IN PROGRESS  3.  Patient will increase 10 meter walk test to >1.6m/s with WFL B step-length, upright posture for >90% of test to improve ease with gait and to override hypokentic and bradykinetic movement.  Baseline: 0.58 m/s with 1OX; 12/18: 0.7 m/s with 0RU; 11/04/22: 0.69 m/s with 0AV;  12/03/22: 0.73 m/s with 4UJ; 01/19/2023: 0.77 m/s with 8JX; 02/23/2023: 0.83 m/s with 9JY; 03/16/2023: 0.83 m/s with 7WG , pt still somewhat crouched, requires cues for upright posture >50% of time; 03/30/23: 0.83 m/s with crouched posture  Goal status: IN PROGRESS  4.  Patient will reduce time to don jacket by at least 5 seconds in order to increase ease with dressing and override bradykinetic movement Baseline: 17 seconds; 12/18: 12 seconds Goal status: MET  5.  Pt will demo or report ability to open standard jar using only 1-2 attempts in order to exhibit improved functional mobility for ADLs.  Baseline: 5.7 seconds with multiple attempts to turn jar lid to open; 12/18: 2.8 sec opens in 2 turns Goal status: MET  6.  Patient will increase Berg Balance score by > 6 points to demonstrate decreased fall risk during functional activities. Baseline: to be tested next 1-2 visits; 12/03/2022: 44/56; 01/21/23 45/56; 48/56; 03/17/2023: 48/56 Goal status: In Progress  7.  Patient will report at least 4 days in a row with minimal to no L hip pain in order to improve QOL and ease/safety with functional mobility. Baseline: Pt reports pain majority of days in L hip; 12/03/2022: pt reports having hip pain about every day; 3/19: rates hip pain daily (being seen now by MD, new imaging); 02/23/2023: pt went for period of time without hip pain (she thinks a week), but reports it has now come back,; 5/28: pt reports hip pain "every other day" Goal  status: In Progress  8. Patient will increase six minute walk test distance to >1000 for progression to community ambulator and improve gait ability Baseline:02/23/2023: 722 ft with 4WW and two standing rest breaks, rates test as hard; 03/16/2023: deferred to next visit due to time; 5/28/20204: 641 ft with 4WW, pt requests to end test early due to fatigue at 5 min 35 sec Goal status: IN PROGRESS  ASSESSMENT:  CLINICAL IMPRESSION: Pt continue to ambulate further this visit prior to requiring a seated rest break, indicating improvement in functional/gait capacity. Pt still generally limited by quick onset of fatigue, and is quick to request a rest break. The pt will benefit from further skilled PT to improve strength, balance, gait and mobility.  OBJECTIVE IMPAIRMENTS: Abnormal gait, decreased balance, decreased cognition, decreased coordination, decreased endurance, decreased knowledge of use of DME, decreased mobility, difficulty walking, decreased strength, dizziness, hypomobility, impaired UE functional use, improper body mechanics, postural dysfunction, and pain.   ACTIVITY LIMITATIONS: lifting, bending, standing, squatting, stairs, transfers, bed mobility, bathing, dressing, hygiene/grooming, and locomotion level  PARTICIPATION LIMITATIONS: meal prep, cleaning, laundry, medication management, personal finances, driving,  shopping, community activity, and yard work  PERSONAL FACTORS: Age, Fitness, Sex, Time since onset of injury/illness/exacerbation, and 3+ comorbidities: PMH includes hx of breast cancer, HTN, MCI, insomnia, cataracts, bipolar 1, alcohol use disorder, GAD, MDD, osteopenia of neck of R femur, chronic B low back pain with radiating pain to buttock and posterior thighs and calves. Pt with multiple comorbidities, please refer to chart for full details  are also affecting patient's functional outcome.   REHAB POTENTIAL: Fair    CLINICAL DECISION MAKING: Evolving/moderate  complexity  EVALUATION COMPLEXITY: High  PLAN:  PT FREQUENCY: 2x/week  PT DURATION: 12 weeks  PLANNED INTERVENTIONS: Therapeutic exercises, Therapeutic activity, Neuromuscular re-education, Balance training, Gait training, Patient/Family education, Self Care, Joint mobilization, Stair training, Vestibular training, Canalith repositioning, Visual/preceptual remediation/compensation, Orthotic/Fit training, DME instructions, Wheelchair mobility training, Spinal mobilization, Cryotherapy, Moist heat, Splintting, Taping, and Manual therapy  PLAN FOR NEXT SESSION: hamtrings loading, hip strength, mobility, endurance, gait and balance, cardio   1:44 PM, 04/20/23    Baird Kay, PT 04/20/2023, 1:44 PM  Physical Therapist - [facility]  Outpatient Physical Therapy- [facility] [phone]

## 2023-04-22 ENCOUNTER — Ambulatory Visit: Payer: Medicare PPO

## 2023-04-22 DIAGNOSIS — R278 Other lack of coordination: Secondary | ICD-10-CM | POA: Diagnosis not present

## 2023-04-22 DIAGNOSIS — R2681 Unsteadiness on feet: Secondary | ICD-10-CM | POA: Diagnosis not present

## 2023-04-22 DIAGNOSIS — M25552 Pain in left hip: Secondary | ICD-10-CM | POA: Diagnosis not present

## 2023-04-22 DIAGNOSIS — G20A1 Parkinson's disease without dyskinesia, without mention of fluctuations: Secondary | ICD-10-CM | POA: Diagnosis not present

## 2023-04-22 DIAGNOSIS — R42 Dizziness and giddiness: Secondary | ICD-10-CM

## 2023-04-22 DIAGNOSIS — R262 Difficulty in walking, not elsewhere classified: Secondary | ICD-10-CM

## 2023-04-22 DIAGNOSIS — M6281 Muscle weakness (generalized): Secondary | ICD-10-CM | POA: Diagnosis not present

## 2023-04-22 NOTE — Therapy (Signed)
OUTPATIENT NEURO PHYSICAL THERAPY TREATMENT    Patient Name: Kendra [name] MRN: [id number] DOB:08-23-1946, 77 y.o., female Today's Date: 04/22/2023  PCP: Erasmo Downer, MD REFERRING PROVIDER: Erasmo Downer, MD  END OF SESSION:  PT End of Session - 04/22/23 1302     Visit Number 47    Number of Visits 60    Date for PT Re-Evaluation 06/08/23    Authorization Type [employer] PPO    Authorization Time Period 11/04/2022-12/30/2022    Progress Note Due on Visit 30    PT Start Time 1301    PT Stop Time 1343    PT Time Calculation (min) 42 min    Equipment Utilized During Treatment Gait belt    Activity Tolerance Patient tolerated treatment well;Patient limited by fatigue    Behavior During Therapy WFL for tasks assessed/performed                         Past Medical History:  Diagnosis Date   Anxiety    Breast cancer (HCC) 03/2018   right breast cancer at 11:00 and 1:00   Bursitis    bilateral hips and knees   Complication of anesthesia    Depression    Family history of breast cancer 03/24/2018   Family history of lung cancer 03/24/2018   History of alcohol dependence (HCC) 2007   no ETOH; resolved since 2007   History of hiatal hernia    History of psychosis 2014   due to sleep disturbance   Hypertension    Parkinson disease    Personal history of radiation therapy 06/2018-07/2018   right breast ca   PONV (postoperative nausea and vomiting)    Past Surgical History:  Procedure Laterality Date   BREAST BIOPSY Right 03/14/2018   11:00 DCIS and invasive ductal carcinoma   BREAST BIOPSY Right 03/14/2018   1:00 Invasive ductal carcinoma   BREAST LUMPECTOMY Right 04/27/2018   lumpectomy of 11 and 1:00 cancers, clear margins, negative LN   BREAST LUMPECTOMY WITH RADIOACTIVE SEED AND SENTINEL LYMPH NODE BIOPSY Right 04/27/2018   Procedure: RIGHT BREAST LUMPECTOMY WITH RADIOACTIVE SEED X 2 AND RIGHT SENTINEL LYMPH NODE BIOPSY;   Surgeon: Glenna Fellows, MD;  Location:  SURGERY CENTER;  Service: General;  Laterality: Right;   CATARACT EXTRACTION W/PHACO Left 08/30/2019   Procedure: CATARACT EXTRACTION PHACO AND INTRAOCULAR LENS PLACEMENT (IOC) LEFT panoptix toric  01:20.5  12.7%  10.26;  Surgeon: Lockie Mola, MD;  Location: [facility];  Service: Ophthalmology;  Laterality: Left;   CATARACT EXTRACTION W/PHACO Right 09/20/2019   Procedure: CATARACT EXTRACTION PHACO AND INTRAOCULAR LENS PLACEMENT (IOC) RIGHT 5.71  00:36.4  15.7%;  Surgeon: Lockie Mola, MD;  Location: [facility];  Service: Ophthalmology;  Laterality: Right;   LAPAROTOMY N/A 02/10/2018   Procedure: EXPLORATORY LAPAROTOMY;  Surgeon: Shonna Chock, MD;  Location: [facility];  Service: Gynecology;  Laterality: N/A;   MASS EXCISION  01/2018   abdominal   OVARIAN CYST REMOVAL     SALPINGOOPHORECTOMY Bilateral 02/10/2018   Procedure: BILATERAL SALPINGO OOPHORECTOMY; PERITONEAL WASHINGS;  Surgeon: Shonna Chock, MD;  Location: [facility];  Service: Gynecology;  Laterality: Bilateral;   TONSILLECTOMY     TONSILLECTOMY AND ADENOIDECTOMY  1953   Patient Active Problem List   Diagnosis Date Noted   Prediabetes 12/15/2022   Chronic cough 08/17/2022   History of allergy to shellfish 02/24/2022   Chronic low back pain of over 3  months duration 02/04/2022   Osteoarthritis of  AC (acromioclavicular) joint (Left) 02/04/2022   Osteoarthritis of hip (Right) 02/04/2022   Impaction fracture of distal end of radius and ulna, sequela (Left) 02/04/2022   Osteoarthritis of 1st (thumb) carpometacarpal (CMC) joint of hand (Left) 02/04/2022   Lumbosacral facet arthropathy (Multilevel) (Bilateral) (T12-S1) 02/04/2022   Lumbar central spinal stenosis w/o neurogenic claudication (L2-3, L3-4, L4-5) 02/04/2022   Lumbar foraminal stenosis (Bilateral: L2-3, L4-5) (Right: (Severe) L4-5) 02/04/2022   Lumbar lateral recess stenosis (Bilateral:  L2-3, L3-4, and L4-5) (Severe) 02/04/2022   DDD (degenerative disc disease), lumbar 02/04/2022   Lumbosacral facet syndrome (Multilevel) (Bilateral) 02/04/2022   Osteoarthritis of lumbar spine 02/04/2022   Allergic conjunctivitis of both eyes 12/23/2021   Chronic hip pain (3ry area of Pain) (Bilateral) (L>R) 12/22/2021   Chronic lower extremity pain (2ry area of Pain) (Bilateral) (L>R) 12/22/2021   Abnormal MRI, lumbar spine (01/31/2020) 12/22/2021   Grade 1 Anterolisthesis of lumbar spine (L3/L4) & (5 mm) (L4/L5) 12/22/2021   Chronic pain syndrome 11/17/2021   High risk medication use 11/17/2021   Disorder of skeletal system 11/17/2021   Problems influencing health status 11/17/2021   Other hyperlipidemia 10/21/2021   Polypharmacy 08/12/2021   Chronic low back pain (1ry area of Pain) (Bilateral) (L>R) w/o sciatica 06/18/2021   Osteopenia of neck of femur (Right) 09/06/2020   Obesity 08/29/2020   Parkinson disease 02/22/2020   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   Bipolar disorder, in full remission, most recent episode depressed (HCC) 11/08/2019   Vitamin D insufficiency 08/24/2019   Insomnia due to mental condition 05/31/2019   Bipolar I disorder, most recent episode depressed (HCC) 05/31/2019   Alcohol use disorder, moderate, in sustained remission (HCC) 05/31/2019   Elevated tumor markers 05/27/2019   GAD (generalized anxiety disorder) 04/10/2019   MDD (major depressive disorder), severe (HCC) 01/31/2019   High serum carbohydrate antigen 19-9 (CA19-9) 12/01/2018   Adjustment disorder with anxious mood 11/24/2018   Essential hypertension 11/24/2018   B12 deficiency 10/17/2018   Incisional hernia, without obstruction or gangrene 09/12/2018   Genetic testing 04/07/2018   Family history of breast cancer 03/24/2018   Family history of lung cancer 03/24/2018   Malignant neoplasm of upper-outer quadrant of right breast in female, estrogen receptor positive (HCC) 03/17/2018   Malignant  neoplasm of upper-inner quadrant of right breast in female, estrogen receptor positive (HCC) 03/17/2018   History of psychosis 02/07/2018   History of insomnia 02/07/2018    ONSET DATE: Pt diagnosed with PD in 2019  REFERRING DIAG:  G20 (ICD-10-CM) - Parkinson's disease      THERAPY DIAG:  Muscle weakness (generalized)  Dizziness and giddiness  Difficulty in walking, not elsewhere classified  Other lack of coordination  Unsteadiness on feet  Rationale for Evaluation and Treatment: Rehabilitation  SUBJECTIVE: Pt reports a sensation in her L hip, she says it is not painful, but "not not painful."  Pt reports her husband has returned from his trip.  Pain: possible L hip pain Pt accompanied by: significant other self  PERTINENT HISTORY:  Pt is a pleasant 77 yo female who was diagnosed with PD in 2019. She is known to clinic and [facility], and was last seen for LSVT BIG Sept-Oct 2022. Pt with chronic pain and has become increasingly sedentary as a result exacerbating mobility difficulties due to PD. Current concerns: Pt reports L and R hip pain and back pain. Pt was being seen by pain management clinic. Pt has had injections that provided  relief at the time. Pt's spouse reports her physician determined surgery was not an option for her pain so the plan is to manage it. Pt spouse thinks pain has resulted in reluctance for pt to move. Pt currently sedentary, doesn't walk much, but when she does she holds on to furniture for support due to balance problems. When pt first stands she must hold on to something due to mix of pain and/or dizziness (described as lightheadedness). Her pain can go from 3/10 to 9/10. It does improve once she starts moving. Pt has increased difficulty with multiple activities including: getting in and out of her bed, getting in and out of her car, freezing, decreased gait speed, decreased balance and cognition, and difficulty using BUE due to tremors.  Per chart other  PMH includes hx of breast cancer, HTN, MCI, insomnia, cataracts, bipolar 1, alcohol use disorder, GAD, MDD, osteopenia of neck of R femur, chronic B low back pain with radiating pain to buttock and posterior thighs and calves. Pt with multiple comorbidities, please refer to chart for full details    PAIN: from eval Are you having pain? Yes: NPRS scale: not rated currently but can reach 9/10 Pain location: L hip and L leg Pain description: pt has difficulty describing Aggravating factors: unsure Relieving factors: laying in the recliner helps, movement  PRECAUTIONS: Fall  WEIGHT BEARING RESTRICTIONS: No  FALLS: Has patient fallen in last 6 months? Yes. Number of falls 1x, leg tangled in chair leg when getting up from a chair  PLOF: Independent with basic ADLs  PATIENT GOALS: improve mobility   BLE Strength Assessment      Right  Left  Hip Flexion 5/5 4/5 (painful)  Hip Horizontal ABDCT  5/5 5/5  Hip Horizontal ADD 4+/5 4+/5  Hip IR (seated)  4+/5 4+/5  Hip ER (seated) 5/5 5/5  Knee Extension 5/5 5/5  Knee Flexion (seated) 4+/5  5/5 (not painful)  Hip ABDCT (supine)  3+/5 4-/5   Focal popliteal edema Left knee, not painful   ROM Assessment Hip     Right  Left   Flexion Supine 132 120 (ouch)  External rotation (90/90) 30 47  Internal Rotation (90/90) 40  20  Supine Hip Extension P/ROM >0 degrees >0 degrees       IMAGING: via chart   07/24/22 DG BONE density " ASSESSMENT: The probability of a major osteoporotic fracture is 15.8% within the next ten years.   The probability of a hip fracture is 2.9% within the next ten years."   12/22/21 DG lumbar spine " IMPRESSION: 1. No acute displaced fracture or traumatic listhesis of the lumbar spine. 2. Interval development of grade 1 anterolisthesis of L3 on L4. Stable grade 1 anterolisthesis of L4 on L5. Appears stable on flexion extension views. 3.  Aortic Atherosclerosis (ICD10-I70.0)."   New from chart:  DG Lumbar  spine 01/05/23: "IMPRESSION: 1. Lumbar spine scoliosis concave right. Diffuse multilevel degenerative change. Disc degeneration most prominent at L2-L3 and L5-S1. 5 mm anterolisthesis L4 on L5. Diffuse severe facet hypertrophy. No flexion or extension abnormality identified. No evidence of fracture. 2. Sclerotic densities are noted in the L2 and L4 vertebral bodies. Although these could represent bone islands blastic metastatic disease cannot be excluded. MRI of the lumbar spine suggested for further evaluation. 3. Large amount of stool noted throughout the colon. 4. Aortoiliac atherosclerotic vascular disease."    DG hip 01/05/23: "IMPRESSION: Moderate bilateral femoroacetabular osteoarthritis, similar to prior."   PATIENT EDUCATION:  Education details: Pt educated throughout session about proper posture and technique with exercises. Improved exercise technique, movement at target joints, use of target muscles after min to mod verbal, visual, tactile cues.  Person educated: Patient and Spouse Education method: Explanation, Demonstration, and Verbal cues Education comprehension: verbalized understanding, returned demonstration, verbal cues required, tactile cues required, and needs further education   TODAY'S TREATMENT:                                                                                                                                         DATE: 04/22/23   Gait belt donned and CGA provided throughout unless specified otherwise  THEREX:   Ambulation for endurance with 4WW and 4# AW 3 x 296 ft CGA. Limited due to woozy feeling. Requires rest break. Pt rates medium. SPO2% ranges 94-97%, HR immediately following intervention around 110 bpm, decreases to 90 after a couple minutes of rest.   4# ankle weights donned for the following: Circuit 1: STS 10x with 5# bar overhead press each rep - pt reports intervention is a challenge  LAQ 1x15 each LE Standing hip abd step-outs  10x each LE. Reports no pain with intervention  4# ankle weights donned for the following: Circuit 1: STS 8x with 5# bar overhead press each rep  Side step out with trunk twist and UE reach  10x each side  Seated march 1x8 each LE Seated trunk twists with UE reach 8x with 5 sec hold/rep  Circuit 1: Ambulation for endurance with 4WW and 4# AW x 148 ft. Pt rates medium Seated hamstring curl with GTB 20x each LE  STS 10x  Seated ankle rocker 20x bilat    HOME EXERCISE PROGRAM: pt to continue HEP as previously given  Access Code: [email or web address] URL: [email or web address] Date: 12/22/2022 Prepared by: Temple Pacini  Exercises - Sit to Stand with Counter Support  - 1 x daily - 5 x weekly - 3 sets - 10 reps  GOALS: Goals reviewed with patient? Yes  SHORT TERM GOALS: Target date: 04/27/2023    Patient will be independent in home exercise program to improve strength/mobility for better functional independence with ADLs. Baseline: LSVT BIG protocol to be initiated next session; 12/18: not yet fully indep, reports she still gets confused about the technique; 12/03/2022: HEP to be updated; Pt reports practicing HEP 2x/week; 02/23/23: not currently performing HEP, reviewed in session and provided new copy; 03/16/2023: pt reports performing HEP every day; 03/30/23: Pt reports she is not being consistent with her HEP Goal status: MET (previously MET)   LONG TERM GOALS: Target date: 06/08/2023     Patient will increase FOTO score to equal to or greater than 49 to demonstrate statistically significant improvement in mobility and quality of life.  Baseline: 40; 12/18: deferred due to time; 45; 3/19: deferred to next visit; 01/21/2023; 02/23/2023: 46 unchanged from previous assessment); 03/17/2023: 48 Goal  status: PARTIALLY MET  2.  Patient (> 9 years old) will complete five times sit to stand test in < 15 seconds indicating decreased fall risk.  Baseline: 27 sec; 12/18 23 sec hands-free;  11/04/22: 24 sec hands free; 2/1/124: 22.7 sec use of BUE, 21.3 sec hands-free; 01/19/23: 18 seconds hands-free; 02/23/23: 19 seconds hands-free; 03/16/2023: 18 seconds hands-free; 03/30/23: 17.3 seconds hands-free  Goal status: IN PROGRESS  3.  Patient will increase 10 meter walk test to >1.46m/s with WFL B step-length, upright posture for >90% of test to improve ease with gait and to override hypokentic and bradykinetic movement.  Baseline: 0.58 m/s with 1OX; 12/18: 0.7 m/s with 0RU; 11/04/22: 0.69 m/s with 0AV;  12/03/22: 0.73 m/s with 4UJ; 01/19/2023: 0.77 m/s with 8JX; 02/23/2023: 0.83 m/s with 9JY; 03/16/2023: 0.83 m/s with 7WG , pt still somewhat crouched, requires cues for upright posture >50% of time; 03/30/23: 0.83 m/s with crouched posture  Goal status: IN PROGRESS  4.  Patient will reduce time to don jacket by at least 5 seconds in order to increase ease with dressing and override bradykinetic movement Baseline: 17 seconds; 12/18: 12 seconds Goal status: MET  5.  Pt will demo or report ability to open standard jar using only 1-2 attempts in order to exhibit improved functional mobility for ADLs.  Baseline: 5.7 seconds with multiple attempts to turn jar lid to open; 12/18: 2.8 sec opens in 2 turns Goal status: MET  6.  Patient will increase Berg Balance score by > 6 points to demonstrate decreased fall risk during functional activities. Baseline: to be tested next 1-2 visits; 12/03/2022: 44/56; 01/21/23 45/56; 48/56; 03/17/2023: 48/56 Goal status: In Progress  7.  Patient will report at least 4 days in a row with minimal to no L hip pain in order to improve QOL and ease/safety with functional mobility. Baseline: Pt reports pain majority of days in L hip; 12/03/2022: pt reports having hip pain about every day; 3/19: rates hip pain daily (being seen now by MD, new imaging); 02/23/2023: pt went for period of time without hip pain (she thinks a week), but reports it has now come back,; 5/28: pt reports hip pain  "every other day" Goal status: In Progress  8. Patient will increase six minute walk test distance to >1000 for progression to community ambulator and improve gait ability Baseline:02/23/2023: 722 ft with 4WW and two standing rest breaks, rates test as hard; 03/16/2023: deferred to next visit due to time; 5/28/20204: 641 ft with 4WW, pt requests to end test early due to fatigue at 5 min 35 sec Goal status: IN PROGRESS  ASSESSMENT:  CLINICAL IMPRESSION: Pt slightly more limited by fatigue today, although she was highly motivated to participate in session. Despite today's limitations, the pt has generally been showing improvement in her gait and functional capacity.The pt will benefit from further skilled PT to improve strength, balance, gait and mobility.  OBJECTIVE IMPAIRMENTS: Abnormal gait, decreased balance, decreased cognition, decreased coordination, decreased endurance, decreased knowledge of use of DME, decreased mobility, difficulty walking, decreased strength, dizziness, hypomobility, impaired UE functional use, improper body mechanics, postural dysfunction, and pain.   ACTIVITY LIMITATIONS: lifting, bending, standing, squatting, stairs, transfers, bed mobility, bathing, dressing, hygiene/grooming, and locomotion level  PARTICIPATION LIMITATIONS: meal prep, cleaning, laundry, medication management, personal finances, driving, shopping, community activity, and yard work  PERSONAL FACTORS: Age, Fitness, Sex, Time since onset of injury/illness/exacerbation, and 3+ comorbidities: PMH includes hx of breast cancer, HTN, MCI, insomnia, cataracts, bipolar  1, alcohol use disorder, GAD, MDD, osteopenia of neck of R femur, chronic B low back pain with radiating pain to buttock and posterior thighs and calves. Pt with multiple comorbidities, please refer to chart for full details  are also affecting patient's functional outcome.   REHAB POTENTIAL: Fair    CLINICAL DECISION MAKING: Evolving/moderate  complexity  EVALUATION COMPLEXITY: High  PLAN:  PT FREQUENCY: 2x/week  PT DURATION: 12 weeks  PLANNED INTERVENTIONS: Therapeutic exercises, Therapeutic activity, Neuromuscular re-education, Balance training, Gait training, Patient/Family education, Self Care, Joint mobilization, Stair training, Vestibular training, Canalith repositioning, Visual/preceptual remediation/compensation, Orthotic/Fit training, DME instructions, Wheelchair mobility training, Spinal mobilization, Cryotherapy, Moist heat, Splintting, Taping, and Manual therapy  PLAN FOR NEXT SESSION: hamtrings loading, hip strength, mobility, endurance, gait and balance, cardio   5:45 PM, 04/22/23    Baird Kay, PT 04/22/2023, 5:45 PM  Physical Therapist - [facility]  Outpatient Physical Therapy- [facility] [phone]

## 2023-04-26 DIAGNOSIS — H40003 Preglaucoma, unspecified, bilateral: Secondary | ICD-10-CM | POA: Diagnosis not present

## 2023-04-27 ENCOUNTER — Ambulatory Visit: Payer: Medicare PPO

## 2023-04-27 DIAGNOSIS — R2681 Unsteadiness on feet: Secondary | ICD-10-CM | POA: Diagnosis not present

## 2023-04-27 DIAGNOSIS — G20A1 Parkinson's disease without dyskinesia, without mention of fluctuations: Secondary | ICD-10-CM | POA: Diagnosis not present

## 2023-04-27 DIAGNOSIS — R262 Difficulty in walking, not elsewhere classified: Secondary | ICD-10-CM

## 2023-04-27 DIAGNOSIS — M25552 Pain in left hip: Secondary | ICD-10-CM | POA: Diagnosis not present

## 2023-04-27 DIAGNOSIS — R42 Dizziness and giddiness: Secondary | ICD-10-CM | POA: Diagnosis not present

## 2023-04-27 DIAGNOSIS — M6281 Muscle weakness (generalized): Secondary | ICD-10-CM | POA: Diagnosis not present

## 2023-04-27 DIAGNOSIS — R278 Other lack of coordination: Secondary | ICD-10-CM | POA: Diagnosis not present

## 2023-04-27 NOTE — Therapy (Signed)
OUTPATIENT NEURO PHYSICAL THERAPY TREATMENT    Patient Name: Kendra [name] MRN: [id number] DOB:01-16-1946, 77 y.o., female Today's Date: 04/27/2023  PCP: Erasmo Downer, MD REFERRING PROVIDER: Erasmo Downer, MD  END OF SESSION:  PT End of Session - 04/27/23 1253     Visit Number 48    Number of Visits 60    Date for PT Re-Evaluation 06/08/23    Authorization Type [employer] PPO    Authorization Time Period 11/04/2022-12/30/2022    Progress Note Due on Visit 30    PT Start Time 1301    PT Stop Time 1341    PT Time Calculation (min) 40 min    Equipment Utilized During Treatment Gait belt    Activity Tolerance Patient tolerated treatment well;Patient limited by fatigue    Behavior During Therapy [facility] for tasks assessed/performed                         Past Medical History:  Diagnosis Date   Anxiety    Breast cancer (HCC) 03/2018   right breast cancer at 11:00 and 1:00   Bursitis    bilateral hips and knees   Complication of anesthesia    Depression    Family history of breast cancer 03/24/2018   Family history of lung cancer 03/24/2018   History of alcohol dependence (HCC) 2007   no ETOH; resolved since 2007   History of hiatal hernia    History of psychosis 2014   due to sleep disturbance   Hypertension    Parkinson disease    Personal history of radiation therapy 06/2018-07/2018   right breast ca   PONV (postoperative nausea and vomiting)    Past Surgical History:  Procedure Laterality Date   BREAST BIOPSY Right 03/14/2018   11:00 DCIS and invasive ductal carcinoma   BREAST BIOPSY Right 03/14/2018   1:00 Invasive ductal carcinoma   BREAST LUMPECTOMY Right 04/27/2018   lumpectomy of 11 and 1:00 cancers, clear margins, negative LN   BREAST LUMPECTOMY WITH RADIOACTIVE SEED AND SENTINEL LYMPH NODE BIOPSY Right 04/27/2018   Procedure: RIGHT BREAST LUMPECTOMY WITH RADIOACTIVE SEED X 2 AND RIGHT SENTINEL LYMPH NODE BIOPSY;   Surgeon: Glenna Fellows, MD;  Location: [facility];  Service: General;  Laterality: Right;   CATARACT EXTRACTION W/PHACO Left 08/30/2019   Procedure: CATARACT EXTRACTION PHACO AND INTRAOCULAR LENS PLACEMENT (IOC) LEFT panoptix toric  01:20.5  12.7%  10.26;  Surgeon: Lockie Mola, MD;  Location: [facility];  Service: Ophthalmology;  Laterality: Left;   CATARACT EXTRACTION W/PHACO Right 09/20/2019   Procedure: CATARACT EXTRACTION PHACO AND INTRAOCULAR LENS PLACEMENT (IOC) RIGHT 5.71  00:36.4  15.7%;  Surgeon: Lockie Mola, MD;  Location: [facility];  Service: Ophthalmology;  Laterality: Right;   LAPAROTOMY N/A 02/10/2018   Procedure: EXPLORATORY LAPAROTOMY;  Surgeon: Shonna Chock, MD;  Location: [facility];  Service: Gynecology;  Laterality: N/A;   MASS EXCISION  01/2018   abdominal   OVARIAN CYST REMOVAL     SALPINGOOPHORECTOMY Bilateral 02/10/2018   Procedure: BILATERAL SALPINGO OOPHORECTOMY; PERITONEAL WASHINGS;  Surgeon: Shonna Chock, MD;  Location: [facility];  Service: Gynecology;  Laterality: Bilateral;   TONSILLECTOMY     TONSILLECTOMY AND ADENOIDECTOMY  1953   Patient Active Problem List   Diagnosis Date Noted   Prediabetes 12/15/2022   Chronic cough 08/17/2022   History of allergy to shellfish 02/24/2022   Chronic low back pain of over 3  months duration 02/04/2022   Osteoarthritis of  AC (acromioclavicular) joint (Left) 02/04/2022   Osteoarthritis of hip (Right) 02/04/2022   Impaction fracture of distal end of radius and ulna, sequela (Left) 02/04/2022   Osteoarthritis of 1st (thumb) carpometacarpal (CMC) joint of hand (Left) 02/04/2022   Lumbosacral facet arthropathy (Multilevel) (Bilateral) (T12-S1) 02/04/2022   Lumbar central spinal stenosis w/o neurogenic claudication (L2-3, L3-4, L4-5) 02/04/2022   Lumbar foraminal stenosis (Bilateral: L2-3, L4-5) (Right: (Severe) L4-5) 02/04/2022   Lumbar lateral recess stenosis (Bilateral:  L2-3, L3-4, and L4-5) (Severe) 02/04/2022   DDD (degenerative disc disease), lumbar 02/04/2022   Lumbosacral facet syndrome (Multilevel) (Bilateral) 02/04/2022   Osteoarthritis of lumbar spine 02/04/2022   Allergic conjunctivitis of both eyes 12/23/2021   Chronic hip pain (3ry area of Pain) (Bilateral) (L>R) 12/22/2021   Chronic lower extremity pain (2ry area of Pain) (Bilateral) (L>R) 12/22/2021   Abnormal MRI, lumbar spine (01/31/2020) 12/22/2021   Grade 1 Anterolisthesis of lumbar spine (L3/L4) & (5 mm) (L4/L5) 12/22/2021   Chronic pain syndrome 11/17/2021   High risk medication use 11/17/2021   Disorder of skeletal system 11/17/2021   Problems influencing health status 11/17/2021   Other hyperlipidemia 10/21/2021   Polypharmacy 08/12/2021   Chronic low back pain (1ry area of Pain) (Bilateral) (L>R) w/o sciatica 06/18/2021   Osteopenia of neck of femur (Right) 09/06/2020   Obesity 08/29/2020   Parkinson disease 02/22/2020   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   Bipolar disorder, in full remission, most recent episode depressed (HCC) 11/08/2019   Vitamin D insufficiency 08/24/2019   Insomnia due to mental condition 05/31/2019   Bipolar I disorder, most recent episode depressed (HCC) 05/31/2019   Alcohol use disorder, moderate, in sustained remission (HCC) 05/31/2019   Elevated tumor markers 05/27/2019   GAD (generalized anxiety disorder) 04/10/2019   MDD (major depressive disorder), severe (HCC) 01/31/2019   High serum carbohydrate antigen 19-9 (CA19-9) 12/01/2018   Adjustment disorder with anxious mood 11/24/2018   Essential hypertension 11/24/2018   B12 deficiency 10/17/2018   Incisional hernia, without obstruction or gangrene 09/12/2018   Genetic testing 04/07/2018   Family history of breast cancer 03/24/2018   Family history of lung cancer 03/24/2018   Malignant neoplasm of upper-outer quadrant of right breast in female, estrogen receptor positive (HCC) 03/17/2018   Malignant  neoplasm of upper-inner quadrant of right breast in female, estrogen receptor positive (HCC) 03/17/2018   History of psychosis 02/07/2018   History of insomnia 02/07/2018    ONSET DATE: Pt diagnosed with PD in 2019  REFERRING DIAG:  G20 (ICD-10-CM) - Parkinson's disease      THERAPY DIAG:  Muscle weakness (generalized)  Difficulty in walking, not elsewhere classified  Unsteadiness on feet  Rationale for Evaluation and Treatment: Rehabilitation  SUBJECTIVE: Pt reports some L hip pain but that she feels it is getting better. She reports no falls. She reports no other updates, but she is tired today.  Pain: possible L hip pain Pt accompanied by: significant other self  PERTINENT HISTORY:  Pt is a pleasant 77 yo female who was diagnosed with PD in 2019. She is known to clinic and [facility], and was last seen for LSVT BIG Sept-Oct 2022. Pt with chronic pain and has become increasingly sedentary as a result exacerbating mobility difficulties due to PD. Current concerns: Pt reports L and R hip pain and back pain. Pt was being seen by pain management clinic. Pt has had injections that provided relief at the time. Pt's spouse reports her physician  determined surgery was not an option for her pain so the plan is to manage it. Pt spouse thinks pain has resulted in reluctance for pt to move. Pt currently sedentary, doesn't walk much, but when she does she holds on to furniture for support due to balance problems. When pt first stands she must hold on to something due to mix of pain and/or dizziness (described as lightheadedness). Her pain can go from 3/10 to 9/10. It does improve once she starts moving. Pt has increased difficulty with multiple activities including: getting in and out of her bed, getting in and out of her car, freezing, decreased gait speed, decreased balance and cognition, and difficulty using BUE due to tremors.  Per chart other PMH includes hx of breast cancer, HTN, MCI, insomnia,  cataracts, bipolar 1, alcohol use disorder, GAD, MDD, osteopenia of neck of R femur, chronic B low back pain with radiating pain to buttock and posterior thighs and calves. Pt with multiple comorbidities, please refer to chart for full details    PAIN: from eval Are you having pain? Yes: NPRS scale: not rated currently but can reach 9/10 Pain location: L hip and L leg Pain description: pt has difficulty describing Aggravating factors: unsure Relieving factors: laying in the recliner helps, movement  PRECAUTIONS: Fall  WEIGHT BEARING RESTRICTIONS: No  FALLS: Has patient fallen in last 6 months? Yes. Number of falls 1x, leg tangled in chair leg when getting up from a chair  PLOF: Independent with basic ADLs  PATIENT GOALS: improve mobility   BLE Strength Assessment      Right  Left  Hip Flexion 5/5 4/5 (painful)  Hip Horizontal ABDCT  5/5 5/5  Hip Horizontal ADD 4+/5 4+/5  Hip IR (seated)  4+/5 4+/5  Hip ER (seated) 5/5 5/5  Knee Extension 5/5 5/5  Knee Flexion (seated) 4+/5  5/5 (not painful)  Hip ABDCT (supine)  3+/5 4-/5   Focal popliteal edema Left knee, not painful   ROM Assessment Hip     Right  Left   Flexion Supine 132 120 (ouch)  External rotation (90/90) 30 47  Internal Rotation (90/90) 40  20  Supine Hip Extension P/ROM >0 degrees >0 degrees       IMAGING: via chart   07/24/22 DG BONE density " ASSESSMENT: The probability of a major osteoporotic fracture is 15.8% within the next ten years.   The probability of a hip fracture is 2.9% within the next ten years."   12/22/21 DG lumbar spine " IMPRESSION: 1. No acute displaced fracture or traumatic listhesis of the lumbar spine. 2. Interval development of grade 1 anterolisthesis of L3 on L4. Stable grade 1 anterolisthesis of L4 on L5. Appears stable on flexion extension views. 3.  Aortic Atherosclerosis (ICD10-I70.0)."   New from chart:  DG Lumbar spine 01/05/23: "IMPRESSION: 1. Lumbar spine scoliosis  concave right. Diffuse multilevel degenerative change. Disc degeneration most prominent at L2-L3 and L5-S1. 5 mm anterolisthesis L4 on L5. Diffuse severe facet hypertrophy. No flexion or extension abnormality identified. No evidence of fracture. 2. Sclerotic densities are noted in the L2 and L4 vertebral bodies. Although these could represent bone islands blastic metastatic disease cannot be excluded. MRI of the lumbar spine suggested for further evaluation. 3. Large amount of stool noted throughout the colon. 4. Aortoiliac atherosclerotic vascular disease."    DG hip 01/05/23: "IMPRESSION: Moderate bilateral femoroacetabular osteoarthritis, similar to prior."   PATIENT EDUCATION:  Education details: Pt educated throughout session about proper posture  and technique with exercises. Improved exercise technique, movement at target joints, use of target muscles after min to mod verbal, visual, tactile cues.  Person educated: Patient and Spouse Education method: Explanation, Demonstration, and Verbal cues Education comprehension: verbalized understanding, returned demonstration, verbal cues required, tactile cues required, and needs further education   TODAY'S TREATMENT:                                                                                                                                         DATE: 04/27/23   Gait belt donned and CGA provided throughout unless specified otherwise  THEREX:   Ambulation for endurance with 4WW and 5# AW 1 x 444 ft CGA. Limited due to woozy feeling. Requires rest break. Pt rates medium.   LAQ 1x15 each LE with 5# AW each LE. Rates medium  STS with BUE shoulder abduction and trunk twist 8x  Hamstring curl with GTB 12x each LE - pt rates difficult Ambulation for endurance with 4WW 1x148 ft with one seated rest break.Pt reports fatigue  NMR:  Dynamic balance activities in // bars - FWD/BCKWD gait without UE support 8x length of bars. Pt  requires rest break due to fatigue - FWD with horizontal head turns followed by BCKWD gait without UE support 2x with head turns length of bars then 2x without as pt reported too much fatigue using head turns and requested rest break. Pt used intermittent UE support -LTL stepping 4x length of bars without UE support, then pt requests rest break. Pt then completes additional 4x after rest break with UE support. Pt rates hard        HOME EXERCISE PROGRAM: pt to continue HEP as previously given  Access Code: [email or web address] URL: [email or web address] Date: 12/22/2022 Prepared by: Temple Pacini  Exercises - Sit to Stand with Counter Support  - 1 x daily - 5 x weekly - 3 sets - 10 reps  GOALS: Goals reviewed with patient? Yes  SHORT TERM GOALS: Target date: 04/27/2023    Patient will be independent in home exercise program to improve strength/mobility for better functional independence with ADLs. Baseline: LSVT BIG protocol to be initiated next session; 12/18: not yet fully indep, reports she still gets confused about the technique; 12/03/2022: HEP to be updated; Pt reports practicing HEP 2x/week; 02/23/23: not currently performing HEP, reviewed in session and provided new copy; 03/16/2023: pt reports performing HEP every day; 03/30/23: Pt reports she is not being consistent with her HEP Goal status: MET (previously MET)   LONG TERM GOALS: Target date: 06/08/2023     Patient will increase FOTO score to equal to or greater than 49 to demonstrate statistically significant improvement in mobility and quality of life.  Baseline: 40; 12/18: deferred due to time; 45; 3/19: deferred to next visit; 01/21/2023; 02/23/2023: 46 unchanged from previous assessment); 03/17/2023: 48 Goal status: PARTIALLY MET  2.  Patient (>  11 years old) will complete five times sit to stand test in < 15 seconds indicating decreased fall risk.  Baseline: 27 sec; 12/18 23 sec hands-free; 11/04/22: 24 sec hands free;  2/1/124: 22.7 sec use of BUE, 21.3 sec hands-free; 01/19/23: 18 seconds hands-free; 02/23/23: 19 seconds hands-free; 03/16/2023: 18 seconds hands-free; 03/30/23: 17.3 seconds hands-free  Goal status: IN PROGRESS  3.  Patient will increase 10 meter walk test to >1.15m/s with WFL B step-length, upright posture for >90% of test to improve ease with gait and to override hypokentic and bradykinetic movement.  Baseline: 0.58 m/s with 1HY; 12/18: 0.7 m/s with 8MV; 11/04/22: 0.69 m/s with 7QI;  12/03/22: 0.73 m/s with 6NG; 01/19/2023: 0.77 m/s with 2XB; 02/23/2023: 0.83 m/s with 2WU; 03/16/2023: 0.83 m/s with 1LK , pt still somewhat crouched, requires cues for upright posture >50% of time; 03/30/23: 0.83 m/s with crouched posture  Goal status: IN PROGRESS  4.  Patient will reduce time to don jacket by at least 5 seconds in order to increase ease with dressing and override bradykinetic movement Baseline: 17 seconds; 12/18: 12 seconds Goal status: MET  5.  Pt will demo or report ability to open standard jar using only 1-2 attempts in order to exhibit improved functional mobility for ADLs.  Baseline: 5.7 seconds with multiple attempts to turn jar lid to open; 12/18: 2.8 sec opens in 2 turns Goal status: MET  6.  Patient will increase Berg Balance score by > 6 points to demonstrate decreased fall risk during functional activities. Baseline: to be tested next 1-2 visits; 12/03/2022: 44/56; 01/21/23 45/56; 48/56; 03/17/2023: 48/56 Goal status: In Progress  7.  Patient will report at least 4 days in a row with minimal to no L hip pain in order to improve QOL and ease/safety with functional mobility. Baseline: Pt reports pain majority of days in L hip; 12/03/2022: pt reports having hip pain about every day; 3/19: rates hip pain daily (being seen now by MD, new imaging); 02/23/2023: pt went for period of time without hip pain (she thinks a week), but reports it has now come back,; 5/28: pt reports hip pain "every other day" Goal  status: In Progress  8. Patient will increase six minute walk test distance to >1000 for progression to community ambulator and improve gait ability Baseline:02/23/2023: 722 ft with 4WW and two standing rest breaks, rates test as hard; 03/16/2023: deferred to next visit due to time; 5/28/20204: 641 ft with 4WW, pt requests to end test early due to fatigue at 5 min 35 sec Goal status: IN PROGRESS  ASSESSMENT:  CLINICAL IMPRESSION: Pt continues to exhibit increased fatigue, but is able to complete multiple standing dynamic balance activities in addition to ambulation for endurance. Will continue to monitor pt's fatigue levels, although, she has increased her overall gait/functional capacity over the last several sessions. The pt will benefit from further skilled PT to improve strength, balance, gait and mobility.  OBJECTIVE IMPAIRMENTS: Abnormal gait, decreased balance, decreased cognition, decreased coordination, decreased endurance, decreased knowledge of use of DME, decreased mobility, difficulty walking, decreased strength, dizziness, hypomobility, impaired UE functional use, improper body mechanics, postural dysfunction, and pain.   ACTIVITY LIMITATIONS: lifting, bending, standing, squatting, stairs, transfers, bed mobility, bathing, dressing, hygiene/grooming, and locomotion level  PARTICIPATION LIMITATIONS: meal prep, cleaning, laundry, medication management, personal finances, driving, shopping, community activity, and yard work  PERSONAL FACTORS: Age, Fitness, Sex, Time since onset of injury/illness/exacerbation, and 3+ comorbidities: PMH includes hx of breast cancer, HTN, MCI,  insomnia, cataracts, bipolar 1, alcohol use disorder, GAD, MDD, osteopenia of neck of R femur, chronic B low back pain with radiating pain to buttock and posterior thighs and calves. Pt with multiple comorbidities, please refer to chart for full details  are also affecting patient's functional outcome.   REHAB  POTENTIAL: Fair    CLINICAL DECISION MAKING: Evolving/moderate complexity  EVALUATION COMPLEXITY: High  PLAN:  PT FREQUENCY: 2x/week  PT DURATION: 12 weeks  PLANNED INTERVENTIONS: Therapeutic exercises, Therapeutic activity, Neuromuscular re-education, Balance training, Gait training, Patient/Family education, Self Care, Joint mobilization, Stair training, Vestibular training, Canalith repositioning, Visual/preceptual remediation/compensation, Orthotic/Fit training, DME instructions, Wheelchair mobility training, Spinal mobilization, Cryotherapy, Moist heat, Splintting, Taping, and Manual therapy  PLAN FOR NEXT SESSION: hamtrings loading, hip strength, mobility, endurance, gait and balance, cardio   1:48 PM, 04/27/23    Baird Kay, PT 04/27/2023, 1:48 PM  Physical Therapist - [facility]  Outpatient Physical Therapy- Main Campus [phone]

## 2023-04-29 ENCOUNTER — Ambulatory Visit: Payer: Medicare PPO

## 2023-04-29 DIAGNOSIS — M6281 Muscle weakness (generalized): Secondary | ICD-10-CM | POA: Diagnosis not present

## 2023-04-29 DIAGNOSIS — R278 Other lack of coordination: Secondary | ICD-10-CM | POA: Diagnosis not present

## 2023-04-29 DIAGNOSIS — R262 Difficulty in walking, not elsewhere classified: Secondary | ICD-10-CM

## 2023-04-29 DIAGNOSIS — G20A1 Parkinson's disease without dyskinesia, without mention of fluctuations: Secondary | ICD-10-CM | POA: Diagnosis not present

## 2023-04-29 DIAGNOSIS — R2681 Unsteadiness on feet: Secondary | ICD-10-CM

## 2023-04-29 DIAGNOSIS — M25552 Pain in left hip: Secondary | ICD-10-CM | POA: Diagnosis not present

## 2023-04-29 DIAGNOSIS — R42 Dizziness and giddiness: Secondary | ICD-10-CM | POA: Diagnosis not present

## 2023-04-29 NOTE — Therapy (Signed)
OUTPATIENT NEURO PHYSICAL THERAPY TREATMENT    Patient Name: Kendra [name] MRN: [id number] DOB:Sep 30, 1946, 77 y.o., female Today's Date: 04/29/2023  PCP: Erasmo Downer, MD REFERRING PROVIDER: Erasmo Downer, MD  END OF SESSION:  PT End of Session - 04/29/23 1258     Visit Number 49    Number of Visits 60    Date for PT Re-Evaluation 06/08/23    Authorization Type [employer] PPO    Authorization Time Period 11/04/2022-12/30/2022    Progress Note Due on Visit 30    PT Start Time 1302    PT Stop Time 1343    PT Time Calculation (min) 41 min    Equipment Utilized During Treatment Gait belt    Activity Tolerance Patient tolerated treatment well;Patient limited by fatigue    Behavior During Therapy WFL for tasks assessed/performed                         Past Medical History:  Diagnosis Date   Anxiety    Breast cancer (HCC) 03/2018   right breast cancer at 11:00 and 1:00   Bursitis    bilateral hips and knees   Complication of anesthesia    Depression    Family history of breast cancer 03/24/2018   Family history of lung cancer 03/24/2018   History of alcohol dependence (HCC) 2007   no ETOH; resolved since 2007   History of hiatal hernia    History of psychosis 2014   due to sleep disturbance   Hypertension    Parkinson disease    Personal history of radiation therapy 06/2018-07/2018   right breast ca   PONV (postoperative nausea and vomiting)    Past Surgical History:  Procedure Laterality Date   BREAST BIOPSY Right 03/14/2018   11:00 DCIS and invasive ductal carcinoma   BREAST BIOPSY Right 03/14/2018   1:00 Invasive ductal carcinoma   BREAST LUMPECTOMY Right 04/27/2018   lumpectomy of 11 and 1:00 cancers, clear margins, negative LN   BREAST LUMPECTOMY WITH RADIOACTIVE SEED AND SENTINEL LYMPH NODE BIOPSY Right 04/27/2018   Procedure: RIGHT BREAST LUMPECTOMY WITH RADIOACTIVE SEED X 2 AND RIGHT SENTINEL LYMPH NODE BIOPSY;   Surgeon: Glenna Fellows, MD;  Location: [facility];  Service: General;  Laterality: Right;   CATARACT EXTRACTION W/PHACO Left 08/30/2019   Procedure: CATARACT EXTRACTION PHACO AND INTRAOCULAR LENS PLACEMENT (IOC) LEFT panoptix toric  01:20.5  12.7%  10.26;  Surgeon: Lockie Mola, MD;  Location: [facility];  Service: Ophthalmology;  Laterality: Left;   CATARACT EXTRACTION W/PHACO Right 09/20/2019   Procedure: CATARACT EXTRACTION PHACO AND INTRAOCULAR LENS PLACEMENT (IOC) RIGHT 5.71  00:36.4  15.7%;  Surgeon: Lockie Mola, MD;  Location: [facility];  Service: Ophthalmology;  Laterality: Right;   LAPAROTOMY N/A 02/10/2018   Procedure: EXPLORATORY LAPAROTOMY;  Surgeon: Shonna Chock, MD;  Location: [facility];  Service: Gynecology;  Laterality: N/A;   MASS EXCISION  01/2018   abdominal   OVARIAN CYST REMOVAL     SALPINGOOPHORECTOMY Bilateral 02/10/2018   Procedure: BILATERAL SALPINGO OOPHORECTOMY; PERITONEAL WASHINGS;  Surgeon: Shonna Chock, MD;  Location: [facility];  Service: Gynecology;  Laterality: Bilateral;   TONSILLECTOMY     TONSILLECTOMY AND ADENOIDECTOMY  1953   Patient Active Problem List   Diagnosis Date Noted   Prediabetes 12/15/2022   Chronic cough 08/17/2022   History of allergy to shellfish 02/24/2022   Chronic low back pain of over 3  months duration 02/04/2022   Osteoarthritis of  AC (acromioclavicular) joint (Left) 02/04/2022   Osteoarthritis of hip (Right) 02/04/2022   Impaction fracture of distal end of radius and ulna, sequela (Left) 02/04/2022   Osteoarthritis of 1st (thumb) carpometacarpal (CMC) joint of hand (Left) 02/04/2022   Lumbosacral facet arthropathy (Multilevel) (Bilateral) (T12-S1) 02/04/2022   Lumbar central spinal stenosis w/o neurogenic claudication (L2-3, L3-4, L4-5) 02/04/2022   Lumbar foraminal stenosis (Bilateral: L2-3, L4-5) (Right: (Severe) L4-5) 02/04/2022   Lumbar lateral recess stenosis (Bilateral:  L2-3, L3-4, and L4-5) (Severe) 02/04/2022   DDD (degenerative disc disease), lumbar 02/04/2022   Lumbosacral facet syndrome (Multilevel) (Bilateral) 02/04/2022   Osteoarthritis of lumbar spine 02/04/2022   Allergic conjunctivitis of both eyes 12/23/2021   Chronic hip pain (3ry area of Pain) (Bilateral) (L>R) 12/22/2021   Chronic lower extremity pain (2ry area of Pain) (Bilateral) (L>R) 12/22/2021   Abnormal MRI, lumbar spine (01/31/2020) 12/22/2021   Grade 1 Anterolisthesis of lumbar spine (L3/L4) & (5 mm) (L4/L5) 12/22/2021   Chronic pain syndrome 11/17/2021   High risk medication use 11/17/2021   Disorder of skeletal system 11/17/2021   Problems influencing health status 11/17/2021   Other hyperlipidemia 10/21/2021   Polypharmacy 08/12/2021   Chronic low back pain (1ry area of Pain) (Bilateral) (L>R) w/o sciatica 06/18/2021   Osteopenia of neck of femur (Right) 09/06/2020   Obesity 08/29/2020   Parkinson disease 02/22/2020   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   Bipolar disorder, in full remission, most recent episode depressed (HCC) 11/08/2019   Vitamin D insufficiency 08/24/2019   Insomnia due to mental condition 05/31/2019   Bipolar I disorder, most recent episode depressed (HCC) 05/31/2019   Alcohol use disorder, moderate, in sustained remission (HCC) 05/31/2019   Elevated tumor markers 05/27/2019   GAD (generalized anxiety disorder) 04/10/2019   MDD (major depressive disorder), severe (HCC) 01/31/2019   High serum carbohydrate antigen 19-9 (CA19-9) 12/01/2018   Adjustment disorder with anxious mood 11/24/2018   Essential hypertension 11/24/2018   B12 deficiency 10/17/2018   Incisional hernia, without obstruction or gangrene 09/12/2018   Genetic testing 04/07/2018   Family history of breast cancer 03/24/2018   Family history of lung cancer 03/24/2018   Malignant neoplasm of upper-outer quadrant of right breast in female, estrogen receptor positive (HCC) 03/17/2018   Malignant  neoplasm of upper-inner quadrant of right breast in female, estrogen receptor positive (HCC) 03/17/2018   History of psychosis 02/07/2018   History of insomnia 02/07/2018    ONSET DATE: Pt diagnosed with PD in 2019  REFERRING DIAG:  G20 (ICD-10-CM) - Parkinson's disease      THERAPY DIAG:  Difficulty in walking, not elsewhere classified  Other lack of coordination  Muscle weakness (generalized)  Unsteadiness on feet  Rationale for Evaluation and Treatment: Rehabilitation  SUBJECTIVE: Pt reports some L hip pain currently. Pt reports no stumbles/falls. Pt reports no other updates.  Pain: possible L hip pain Pt accompanied by: significant other self  PERTINENT HISTORY:  Pt is a pleasant 77 yo female who was diagnosed with PD in 2019. She is known to clinic and [facility], and was last seen for LSVT BIG Sept-Oct 2022. Pt with chronic pain and has become increasingly sedentary as a result exacerbating mobility difficulties due to PD. Current concerns: Pt reports L and R hip pain and back pain. Pt was being seen by pain management clinic. Pt has had injections that provided relief at the time. Pt's spouse reports her physician determined surgery was not an option for  her pain so the plan is to manage it. Pt spouse thinks pain has resulted in reluctance for pt to move. Pt currently sedentary, doesn't walk much, but when she does she holds on to furniture for support due to balance problems. When pt first stands she must hold on to something due to mix of pain and/or dizziness (described as lightheadedness). Her pain can go from 3/10 to 9/10. It does improve once she starts moving. Pt has increased difficulty with multiple activities including: getting in and out of her bed, getting in and out of her car, freezing, decreased gait speed, decreased balance and cognition, and difficulty using BUE due to tremors.  Per chart other PMH includes hx of breast cancer, HTN, MCI, insomnia, cataracts, bipolar  1, alcohol use disorder, GAD, MDD, osteopenia of neck of R femur, chronic B low back pain with radiating pain to buttock and posterior thighs and calves. Pt with multiple comorbidities, please refer to chart for full details    PAIN: from eval Are you having pain? Yes: NPRS scale: not rated currently but can reach 9/10 Pain location: L hip and L leg Pain description: pt has difficulty describing Aggravating factors: unsure Relieving factors: laying in the recliner helps, movement  PRECAUTIONS: Fall  WEIGHT BEARING RESTRICTIONS: No  FALLS: Has patient fallen in last 6 months? Yes. Number of falls 1x, leg tangled in chair leg when getting up from a chair  PLOF: Independent with basic ADLs  PATIENT GOALS: improve mobility   BLE Strength Assessment      Right  Left  Hip Flexion 5/5 4/5 (painful)  Hip Horizontal ABDCT  5/5 5/5  Hip Horizontal ADD 4+/5 4+/5  Hip IR (seated)  4+/5 4+/5  Hip ER (seated) 5/5 5/5  Knee Extension 5/5 5/5  Knee Flexion (seated) 4+/5  5/5 (not painful)  Hip ABDCT (supine)  3+/5 4-/5   Focal popliteal edema Left knee, not painful   ROM Assessment Hip     Right  Left   Flexion Supine 132 120 (ouch)  External rotation (90/90) 30 47  Internal Rotation (90/90) 40  20  Supine Hip Extension P/ROM >0 degrees >0 degrees       IMAGING: via chart   07/24/22 DG BONE density " ASSESSMENT: The probability of a major osteoporotic fracture is 15.8% within the next ten years.   The probability of a hip fracture is 2.9% within the next ten years."   12/22/21 DG lumbar spine " IMPRESSION: 1. No acute displaced fracture or traumatic listhesis of the lumbar spine. 2. Interval development of grade 1 anterolisthesis of L3 on L4. Stable grade 1 anterolisthesis of L4 on L5. Appears stable on flexion extension views. 3.  Aortic Atherosclerosis (ICD10-I70.0)."   New from chart:  DG Lumbar spine 01/05/23: "IMPRESSION: 1. Lumbar spine scoliosis concave right.  Diffuse multilevel degenerative change. Disc degeneration most prominent at L2-L3 and L5-S1. 5 mm anterolisthesis L4 on L5. Diffuse severe facet hypertrophy. No flexion or extension abnormality identified. No evidence of fracture. 2. Sclerotic densities are noted in the L2 and L4 vertebral bodies. Although these could represent bone islands blastic metastatic disease cannot be excluded. MRI of the lumbar spine suggested for further evaluation. 3. Large amount of stool noted throughout the colon. 4. Aortoiliac atherosclerotic vascular disease."    DG hip 01/05/23: "IMPRESSION: Moderate bilateral femoroacetabular osteoarthritis, similar to prior."   PATIENT EDUCATION:  Education details: Pt educated throughout session about proper posture and technique with exercises. Improved exercise technique,  movement at target joints, use of target muscles after min to mod verbal, visual, tactile cues.  Person educated: Patient and Spouse Education method: Explanation, Demonstration, and Verbal cues Education comprehension: verbalized understanding, returned demonstration, verbal cues required, tactile cues required, and needs further education   TODAY'S TREATMENT:                                                                                                                                         DATE: 04/29/23   Gait belt donned and CGA provided throughout unless specified otherwise  THEREX:   Ambulation for endurance with 4WW and 5# AW 1 x 444 ft, 1x 296 ft, CGA. Pt rates medium.   Circuit: 5# weights donned STS with overhead press using 5# bar 8x LAQ 1x15 each LE with 5# AW each LE. Rates medium  Standing hamstring curl 12x each LE - Pt rates as a moderate challenge, fatiguing for pt  Standing LTL step out with bilat shoulder abduction 6x each way  Ambulation for endurance with 4WW 1x148 ft with one seated rest break.Pt reports fatigue  Weights doffed, otherwise pt repeats circuit:    Circuit: STS with overhead press using 5# bar 8x LAQ 1x15 each LE with 5# AW each LE. Rates medium  Standing hamstring curl 12x each LE - Pt rates as a moderate challenge  Standing LTL step out with bilat shoulder abduction 6x each way. Pt requests rest break  Ambulation for endurance with 4WW 1x148 ft with one seated rest break.Pt reports fatigue  Standing heel raises 3x15 bilat LE     HOME EXERCISE PROGRAM: pt to continue HEP as previously given  Access Code: [email or web address] URL: [email or web address] Date: 12/22/2022 Prepared by: Temple Pacini  Exercises - Sit to Stand with Counter Support  - 1 x daily - 5 x weekly - 3 sets - 10 reps  GOALS: Goals reviewed with patient? Yes  SHORT TERM GOALS: Target date: 04/27/2023    Patient will be independent in home exercise program to improve strength/mobility for better functional independence with ADLs. Baseline: LSVT BIG protocol to be initiated next session; 12/18: not yet fully indep, reports she still gets confused about the technique; 12/03/2022: HEP to be updated; Pt reports practicing HEP 2x/week; 02/23/23: not currently performing HEP, reviewed in session and provided new copy; 03/16/2023: pt reports performing HEP every day; 03/30/23: Pt reports she is not being consistent with her HEP Goal status: MET (previously MET)   LONG TERM GOALS: Target date: 06/08/2023     Patient will increase FOTO score to equal to or greater than 49 to demonstrate statistically significant improvement in mobility and quality of life.  Baseline: 40; 12/18: deferred due to time; 45; 3/19: deferred to next visit; 01/21/2023; 02/23/2023: 46 unchanged from previous assessment); 03/17/2023: 48 Goal status: PARTIALLY MET  2.  Patient (> 72 years old) will complete five times sit to  stand test in < 15 seconds indicating decreased fall risk.  Baseline: 27 sec; 12/18 23 sec hands-free; 11/04/22: 24 sec hands free; 2/1/124: 22.7 sec use of BUE, 21.3 sec  hands-free; 01/19/23: 18 seconds hands-free; 02/23/23: 19 seconds hands-free; 03/16/2023: 18 seconds hands-free; 03/30/23: 17.3 seconds hands-free  Goal status: IN PROGRESS  3.  Patient will increase 10 meter walk test to >1.5m/s with WFL B step-length, upright posture for >90% of test to improve ease with gait and to override hypokentic and bradykinetic movement.  Baseline: 0.58 m/s with 1OX; 12/18: 0.7 m/s with 0RU; 11/04/22: 0.69 m/s with 0AV;  12/03/22: 0.73 m/s with 4UJ; 01/19/2023: 0.77 m/s with 8JX; 02/23/2023: 0.83 m/s with 9JY; 03/16/2023: 0.83 m/s with 7WG , pt still somewhat crouched, requires cues for upright posture >50% of time; 03/30/23: 0.83 m/s with crouched posture  Goal status: IN PROGRESS  4.  Patient will reduce time to don jacket by at least 5 seconds in order to increase ease with dressing and override bradykinetic movement Baseline: 17 seconds; 12/18: 12 seconds Goal status: MET  5.  Pt will demo or report ability to open standard jar using only 1-2 attempts in order to exhibit improved functional mobility for ADLs.  Baseline: 5.7 seconds with multiple attempts to turn jar lid to open; 12/18: 2.8 sec opens in 2 turns Goal status: MET  6.  Patient will increase Berg Balance score by > 6 points to demonstrate decreased fall risk during functional activities. Baseline: to be tested next 1-2 visits; 12/03/2022: 44/56; 01/21/23 45/56; 48/56; 03/17/2023: 48/56 Goal status: In Progress  7.  Patient will report at least 4 days in a row with minimal to no L hip pain in order to improve QOL and ease/safety with functional mobility. Baseline: Pt reports pain majority of days in L hip; 12/03/2022: pt reports having hip pain about every day; 3/19: rates hip pain daily (being seen now by MD, new imaging); 02/23/2023: pt went for period of time without hip pain (she thinks a week), but reports it has now come back,; 5/28: pt reports hip pain "every other day" Goal status: In Progress  8. Patient will  increase six minute walk test distance to >1000 for progression to community ambulator and improve gait ability Baseline:02/23/2023: 722 ft with 4WW and two standing rest breaks, rates test as hard; 03/16/2023: deferred to next visit due to time; 5/28/20204: 641 ft with 4WW, pt requests to end test early due to fatigue at 5 min 35 sec Goal status: IN PROGRESS  ASSESSMENT:  CLINICAL IMPRESSION: Pt able to complete increased total distanced ambulated with 5# AW, further exhibiting improvement in endurance. PT does have to encourage pt throughout to participate in activities as pt quick to ask for rest break. The pt will benefit from further skilled PT to improve strength, balance, gait and mobility.  OBJECTIVE IMPAIRMENTS: Abnormal gait, decreased balance, decreased cognition, decreased coordination, decreased endurance, decreased knowledge of use of DME, decreased mobility, difficulty walking, decreased strength, dizziness, hypomobility, impaired UE functional use, improper body mechanics, postural dysfunction, and pain.   ACTIVITY LIMITATIONS: lifting, bending, standing, squatting, stairs, transfers, bed mobility, bathing, dressing, hygiene/grooming, and locomotion level  PARTICIPATION LIMITATIONS: meal prep, cleaning, laundry, medication management, personal finances, driving, shopping, community activity, and yard work  PERSONAL FACTORS: Age, Fitness, Sex, Time since onset of injury/illness/exacerbation, and 3+ comorbidities: PMH includes hx of breast cancer, HTN, MCI, insomnia, cataracts, bipolar 1, alcohol use disorder, GAD, MDD, osteopenia of neck of R femur, chronic  B low back pain with radiating pain to buttock and posterior thighs and calves. Pt with multiple comorbidities, please refer to chart for full details  are also affecting patient's functional outcome.   REHAB POTENTIAL: Fair    CLINICAL DECISION MAKING: Evolving/moderate complexity  EVALUATION COMPLEXITY: High  PLAN:  PT  FREQUENCY: 2x/week  PT DURATION: 12 weeks  PLANNED INTERVENTIONS: Therapeutic exercises, Therapeutic activity, Neuromuscular re-education, Balance training, Gait training, Patient/Family education, Self Care, Joint mobilization, Stair training, Vestibular training, Canalith repositioning, Visual/preceptual remediation/compensation, Orthotic/Fit training, DME instructions, Wheelchair mobility training, Spinal mobilization, Cryotherapy, Moist heat, Splintting, Taping, and Manual therapy  PLAN FOR NEXT SESSION: hamtrings loading, hip strength, mobility, endurance, gait and balance, cardio   3:22 PM, 04/29/23    Baird Kay, PT 04/29/2023, 3:22 PM  Physical Therapist - [facility]  Outpatient Physical Therapy- Main Campus [phone]

## 2023-05-03 ENCOUNTER — Ambulatory Visit: Payer: Medicare PPO | Attending: Family Medicine

## 2023-05-03 DIAGNOSIS — R262 Difficulty in walking, not elsewhere classified: Secondary | ICD-10-CM | POA: Insufficient documentation

## 2023-05-03 DIAGNOSIS — M25552 Pain in left hip: Secondary | ICD-10-CM | POA: Insufficient documentation

## 2023-05-03 DIAGNOSIS — R42 Dizziness and giddiness: Secondary | ICD-10-CM | POA: Diagnosis not present

## 2023-05-03 DIAGNOSIS — M6281 Muscle weakness (generalized): Secondary | ICD-10-CM | POA: Insufficient documentation

## 2023-05-03 DIAGNOSIS — R278 Other lack of coordination: Secondary | ICD-10-CM | POA: Insufficient documentation

## 2023-05-03 DIAGNOSIS — R2681 Unsteadiness on feet: Secondary | ICD-10-CM | POA: Insufficient documentation

## 2023-05-03 NOTE — Therapy (Signed)
OUTPATIENT NEURO PHYSICAL THERAPY TREATMENT/Physical Therapy Progress Note   Dates of reporting period  03/30/2023   to   05/03/2023     Patient Name: Kendra [name] MRN: [id number] DOB:1946/11/01, 77 y.o., female Today's Date: 05/03/2023  PCP: Erasmo Downer, MD REFERRING PROVIDER: Erasmo Downer, MD  END OF SESSION:  PT End of Session - 05/03/23 1302     Visit Number 50    Number of Visits 60    Date for PT Re-Evaluation 06/08/23    Authorization Type [employer] PPO    Authorization Time Period 11/04/2022-12/30/2022    Progress Note Due on Visit 30    PT Start Time 1301    PT Stop Time 1345    PT Time Calculation (min) 44 min    Equipment Utilized During Treatment Gait belt    Activity Tolerance Patient tolerated treatment well;Patient limited by fatigue    Behavior During Therapy WFL for tasks assessed/performed                         Past Medical History:  Diagnosis Date   Anxiety    Breast cancer (HCC) 03/2018   right breast cancer at 11:00 and 1:00   Bursitis    bilateral hips and knees   Complication of anesthesia    Depression    Family history of breast cancer 03/24/2018   Family history of lung cancer 03/24/2018   History of alcohol dependence (HCC) 2007   no ETOH; resolved since 2007   History of hiatal hernia    History of psychosis 2014   due to sleep disturbance   Hypertension    Parkinson disease    Personal history of radiation therapy 06/2018-07/2018   right breast ca   PONV (postoperative nausea and vomiting)    Past Surgical History:  Procedure Laterality Date   BREAST BIOPSY Right 03/14/2018   11:00 DCIS and invasive ductal carcinoma   BREAST BIOPSY Right 03/14/2018   1:00 Invasive ductal carcinoma   BREAST LUMPECTOMY Right 04/27/2018   lumpectomy of 11 and 1:00 cancers, clear margins, negative LN   BREAST LUMPECTOMY WITH RADIOACTIVE SEED AND SENTINEL LYMPH NODE BIOPSY Right 04/27/2018   Procedure: RIGHT  BREAST LUMPECTOMY WITH RADIOACTIVE SEED X 2 AND RIGHT SENTINEL LYMPH NODE BIOPSY;  Surgeon: Glenna Fellows, MD;  Location:  [facility];  Service: General;  Laterality: Right;   CATARACT EXTRACTION W/PHACO Left 08/30/2019   Procedure: CATARACT EXTRACTION PHACO AND INTRAOCULAR LENS PLACEMENT (IOC) LEFT panoptix toric  01:20.5  12.7%  10.26;  Surgeon: Lockie Mola, MD;  Location: [facility];  Service: Ophthalmology;  Laterality: Left;   CATARACT EXTRACTION W/PHACO Right 09/20/2019   Procedure: CATARACT EXTRACTION PHACO AND INTRAOCULAR LENS PLACEMENT (IOC) RIGHT 5.71  00:36.4  15.7%;  Surgeon: Lockie Mola, MD;  Location: [facility];  Service: Ophthalmology;  Laterality: Right;   LAPAROTOMY N/A 02/10/2018   Procedure: EXPLORATORY LAPAROTOMY;  Surgeon: Shonna Chock, MD;  Location: [facility];  Service: Gynecology;  Laterality: N/A;   MASS EXCISION  01/2018   abdominal   OVARIAN CYST REMOVAL     SALPINGOOPHORECTOMY Bilateral 02/10/2018   Procedure: BILATERAL SALPINGO OOPHORECTOMY; PERITONEAL WASHINGS;  Surgeon: Shonna Chock, MD;  Location: [facility];  Service: Gynecology;  Laterality: Bilateral;   TONSILLECTOMY     TONSILLECTOMY AND ADENOIDECTOMY  1953   Patient Active Problem List   Diagnosis Date Noted   Prediabetes 12/15/2022   Chronic cough  08/17/2022   History of allergy to shellfish 02/24/2022   Chronic low back pain of over 3 months duration 02/04/2022   Osteoarthritis of  AC (acromioclavicular) joint (Left) 02/04/2022   Osteoarthritis of hip (Right) 02/04/2022   Impaction fracture of distal end of radius and ulna, sequela (Left) 02/04/2022   Osteoarthritis of 1st (thumb) carpometacarpal (CMC) joint of hand (Left) 02/04/2022   Lumbosacral facet arthropathy (Multilevel) (Bilateral) (T12-S1) 02/04/2022   Lumbar central spinal stenosis w/o neurogenic claudication (L2-3, L3-4, L4-5) 02/04/2022   Lumbar foraminal stenosis (Bilateral: L2-3,  L4-5) (Right: (Severe) L4-5) 02/04/2022   Lumbar lateral recess stenosis (Bilateral: L2-3, L3-4, and L4-5) (Severe) 02/04/2022   DDD (degenerative disc disease), lumbar 02/04/2022   Lumbosacral facet syndrome (Multilevel) (Bilateral) 02/04/2022   Osteoarthritis of lumbar spine 02/04/2022   Allergic conjunctivitis of both eyes 12/23/2021   Chronic hip pain (3ry area of Pain) (Bilateral) (L>R) 12/22/2021   Chronic lower extremity pain (2ry area of Pain) (Bilateral) (L>R) 12/22/2021   Abnormal MRI, lumbar spine (01/31/2020) 12/22/2021   Grade 1 Anterolisthesis of lumbar spine (L3/L4) & (5 mm) (L4/L5) 12/22/2021   Chronic pain syndrome 11/17/2021   High risk medication use 11/17/2021   Disorder of skeletal system 11/17/2021   Problems influencing health status 11/17/2021   Other hyperlipidemia 10/21/2021   Polypharmacy 08/12/2021   Chronic low back pain (1ry area of Pain) (Bilateral) (L>R) w/o sciatica 06/18/2021   Osteopenia of neck of femur (Right) 09/06/2020   Obesity 08/29/2020   Parkinson disease 02/22/2020   Mucinous cystadenoma 12/24/2019   Bipolar disorder, in full remission, most recent episode depressed (HCC) 11/08/2019   Vitamin D insufficiency 08/24/2019   Insomnia due to mental condition 05/31/2019   Bipolar I disorder, most recent episode depressed (HCC) 05/31/2019   Alcohol use disorder, moderate, in sustained remission (HCC) 05/31/2019   Elevated tumor markers 05/27/2019   GAD (generalized anxiety disorder) 04/10/2019   MDD (major depressive disorder), severe (HCC) 01/31/2019   High serum carbohydrate antigen 19-9 (CA19-9) 12/01/2018   Adjustment disorder with anxious mood 11/24/2018   Essential hypertension 11/24/2018   B12 deficiency 10/17/2018   Incisional hernia, without obstruction or gangrene 09/12/2018   Genetic testing 04/07/2018   Family history of breast cancer 03/24/2018   Family history of lung cancer 03/24/2018   Malignant neoplasm of upper-outer quadrant  of right breast in female, estrogen receptor positive (HCC) 03/17/2018   Malignant neoplasm of upper-inner quadrant of right breast in female, estrogen receptor positive (HCC) 03/17/2018   History of psychosis 02/07/2018   History of insomnia 02/07/2018    ONSET DATE: Pt diagnosed with PD in 2019  REFERRING DIAG:  G20 (ICD-10-CM) - Parkinson's disease      THERAPY DIAG:  Muscle weakness (generalized)  Difficulty in walking, not elsewhere classified  Unsteadiness on feet  Dizziness and giddiness  Rationale for Evaluation and Treatment: Rehabilitation  SUBJECTIVE: Pt reports L hip pain, reports no stumbles/falls. Pt reports no other changes or concerns.   Pain: possible L hip pain Pt accompanied by: significant other self  PERTINENT HISTORY:  Pt is a pleasant 77 yo female who was diagnosed with PD in 2019. She is known to clinic and [facility], and was last seen for LSVT BIG Sept-Oct 2022. Pt with chronic pain and has become increasingly sedentary as a result exacerbating mobility difficulties due to PD. Current concerns: Pt reports L and R hip pain and back pain. Pt was being seen by pain management clinic. Pt has had injections that  provided relief at the time. Pt's spouse reports her physician determined surgery was not an option for her pain so the plan is to manage it. Pt spouse thinks pain has resulted in reluctance for pt to move. Pt currently sedentary, doesn't walk much, but when she does she holds on to furniture for support due to balance problems. When pt first stands she must hold on to something due to mix of pain and/or dizziness (described as lightheadedness). Her pain can go from 3/10 to 9/10. It does improve once she starts moving. Pt has increased difficulty with multiple activities including: getting in and out of her bed, getting in and out of her car, freezing, decreased gait speed, decreased balance and cognition, and difficulty using BUE due to tremors.  Per chart  other PMH includes hx of breast cancer, HTN, MCI, insomnia, cataracts, bipolar 1, alcohol use disorder, GAD, MDD, osteopenia of neck of R femur, chronic B low back pain with radiating pain to buttock and posterior thighs and calves. Pt with multiple comorbidities, please refer to chart for full details    PAIN: from eval Are you having pain? Yes: NPRS scale: not rated currently but can reach 9/10 Pain location: L hip and L leg Pain description: pt has difficulty describing Aggravating factors: unsure Relieving factors: laying in the recliner helps, movement  PRECAUTIONS: Fall  WEIGHT BEARING RESTRICTIONS: No  FALLS: Has patient fallen in last 6 months? Yes. Number of falls 1x, leg tangled in chair leg when getting up from a chair  PLOF: Independent with basic ADLs  PATIENT GOALS: improve mobility   BLE Strength Assessment      Right  Left  Hip Flexion 5/5 4/5 (painful)  Hip Horizontal ABDCT  5/5 5/5  Hip Horizontal ADD 4+/5 4+/5  Hip IR (seated)  4+/5 4+/5  Hip ER (seated) 5/5 5/5  Knee Extension 5/5 5/5  Knee Flexion (seated) 4+/5  5/5 (not painful)  Hip ABDCT (supine)  3+/5 4-/5   Focal popliteal edema Left knee, not painful   ROM Assessment Hip     Right  Left   Flexion Supine 132 120 (ouch)  External rotation (90/90) 30 47  Internal Rotation (90/90) 40  20  Supine Hip Extension P/ROM >0 degrees >0 degrees       IMAGING: via chart   07/24/22 DG BONE density " ASSESSMENT: The probability of a major osteoporotic fracture is 15.8% within the next ten years.   The probability of a hip fracture is 2.9% within the next ten years."   12/22/21 DG lumbar spine " IMPRESSION: 1. No acute displaced fracture or traumatic listhesis of the lumbar spine. 2. Interval development of grade 1 anterolisthesis of L3 on L4. Stable grade 1 anterolisthesis of L4 on L5. Appears stable on flexion extension views. 3.  Aortic Atherosclerosis (ICD10-I70.0)."   New from chart:  DG  Lumbar spine 01/05/23: "IMPRESSION: 1. Lumbar spine scoliosis concave right. Diffuse multilevel degenerative change. Disc degeneration most prominent at L2-L3 and L5-S1. 5 mm anterolisthesis L4 on L5. Diffuse severe facet hypertrophy. No flexion or extension abnormality identified. No evidence of fracture. 2. Sclerotic densities are noted in the L2 and L4 vertebral bodies. Although these could represent bone islands blastic metastatic disease cannot be excluded. MRI of the lumbar spine suggested for further evaluation. 3. Large amount of stool noted throughout the colon. 4. Aortoiliac atherosclerotic vascular disease."    DG hip 01/05/23: "IMPRESSION: Moderate bilateral femoroacetabular osteoarthritis, similar to prior."   PATIENT EDUCATION:  Education details: Pt educated throughout session about proper posture and technique with exercises. Improved exercise technique, movement at target joints, use of target muscles after min to mod verbal, visual, tactile cues.  Person educated: Patient and Spouse Education method: Explanation, Demonstration, and Verbal cues Education comprehension: verbalized understanding, returned demonstration, verbal cues required, tactile cues required, and needs further education   TODAY'S TREATMENT:                                                                                                                                         DATE: 05/03/23   Gait belt donned and CGA provided throughout unless specified otherwise  TA: Goal testing completed on this date. Please refer to goal section below for details. PT instructs pt throughout in goals and goal performance, indications on progress, plan. With pt permission PT provides education regarding goal performance and PT plan.  Instructed pt in importance of performing HEP at home and ambulating more with RW.  THEREX:   STS 2x5    HOME EXERCISE PROGRAM: pt to continue HEP as previously given  Access  Code: [email or web address] URL: [email or web address] Date: 12/22/2022 Prepared by: Temple Pacini  Exercises - Sit to Stand with Counter Support  - 1 x daily - 5 x weekly - 3 sets - 10 reps  GOALS: Goals reviewed with patient? Yes  SHORT TERM GOALS: Target date: 04/27/2023    Patient will be independent in home exercise program to improve strength/mobility for better functional independence with ADLs. Baseline: LSVT BIG protocol to be initiated next session; 12/18: not yet fully indep, reports she still gets confused about the technique; 12/03/2022: HEP to be updated; Pt reports practicing HEP 2x/week; 02/23/23: not currently performing HEP, reviewed in session and provided new copy; 03/16/2023: pt reports performing HEP every day; 03/30/23: Pt reports she is not being consistent with her HEP; 7/1: pt reports she is not doing much at home Goal status: MET (previously MET)   LONG TERM GOALS: Target date: 06/08/2023     Patient will increase FOTO score to equal to or greater than 49 to demonstrate statistically significant improvement in mobility and quality of life.  Baseline: 40; 12/18: deferred due to time; 45; 3/19: deferred to next visit; 01/21/2023; 02/23/2023: 46 unchanged from previous assessment); 03/17/2023: 48 Goal status: PARTIALLY MET  2.  Patient (> 78 years old) will complete five times sit to stand test in < 15 seconds indicating decreased fall risk.  Baseline: 27 sec; 12/18 23 sec hands-free; 11/04/22: 24 sec hands free; 2/1/124: 22.7 sec use of BUE, 21.3 sec hands-free; 01/19/23: 18 seconds hands-free; 02/23/23: 19 seconds hands-free; 03/16/2023: 18 seconds hands-free; 03/30/23: 17.3 seconds hands-free; 05/03/23: 17.3 seconds hands-free  Goal status: IN PROGRESS  3.  Patient will increase 10 meter walk test to >1.20m/s with WFL B step-length, upright posture for >90% of test to improve ease with gait and to  override hypokentic and bradykinetic movement.  Baseline: 0.58 m/s with 1OX;  12/18: 0.7 m/s with 0RU; 11/04/22: 0.69 m/s with 0AV;  12/03/22: 0.73 m/s with 4UJ; 01/19/2023: 0.77 m/s with 8JX; 02/23/2023: 0.83 m/s with 9JY; 03/16/2023: 0.83 m/s with 7WG , pt still somewhat crouched, requires cues for upright posture >50% of time; 03/30/23: 0.83 m/s with crouched posture; 05/03/23 0.8 m/s with RW, some improvement with upright posture  Goal status: IN PROGRESS  4.  Patient will reduce time to don jacket by at least 5 seconds in order to increase ease with dressing and override bradykinetic movement Baseline: 17 seconds; 12/18: 12 seconds Goal status: MET  5.  Pt will demo or report ability to open standard jar using only 1-2 attempts in order to exhibit improved functional mobility for ADLs.  Baseline: 5.7 seconds with multiple attempts to turn jar lid to open; 12/18: 2.8 sec opens in 2 turns Goal status: MET  6.  Patient will increase Berg Balance score by > 6 points to demonstrate decreased fall risk during functional activities. Baseline: to be tested next 1-2 visits; 12/03/2022: 44/56; 01/21/23 45/56; 48/56; 03/17/2023: 48/56; 05/03/23: 50/56 Goal status: Partially met   7.  Patient will report at least 4 days in a row with minimal to no L hip pain in order to improve QOL and ease/safety with functional mobility. Baseline: Pt reports pain majority of days in L hip; 12/03/2022: pt reports having hip pain about every day; 3/19: rates hip pain daily (being seen now by MD, new imaging); 02/23/2023: pt went for period of time without hip pain (she thinks a week), but reports it has now come back,; 5/28: pt reports hip pain "every other day"; 05/03/23: pt reports pain in hip every day in past week Goal status: NOT MET/discontinued   8. Patient will increase six minute walk test distance to >1000 for progression to community ambulator and improve gait ability Baseline:02/23/2023: 722 ft with 4WW and two standing rest breaks, rates test as hard; 03/16/2023: deferred to next visit due to time;  5/28/20204: 641 ft with 4WW, pt requests to end test early due to fatigue at 5 min 35 sec; 05/03/23: 645 ft with 4WW and able to complete full test  Goal status: IN PROGRESS  ASSESSMENT:  CLINICAL IMPRESSION: Goals reassessed for progress note. Pt with similar scores to last testing date 03/30/23. This indicates no change or minimal change in , 5xSTS and performance. Pt was able to complete this time, however, which is an improvement from previous assessment. Pt hip pain goal discontinued as this appears to be chronic and unchanging with PT. While pt with minimal change in scores for majority of tests performed, the pt did improve on the BERG, indicating increased balance ability. Pt's overall test performance might have been impacted by increased dizziness levels today. Will continue to monitor. The pt will benefit from further skilled PT to improve strength, balance, gait and mobility.  OBJECTIVE IMPAIRMENTS: Abnormal gait, decreased balance, decreased cognition, decreased coordination, decreased endurance, decreased knowledge of use of DME, decreased mobility, difficulty walking, decreased strength, dizziness, hypomobility, impaired UE functional use, improper body mechanics, postural dysfunction, and pain.   ACTIVITY LIMITATIONS: lifting, bending, standing, squatting, stairs, transfers, bed mobility, bathing, dressing, hygiene/grooming, and locomotion level  PARTICIPATION LIMITATIONS: meal prep, cleaning, laundry, medication management, personal finances, driving, shopping, community activity, and yard work  PERSONAL FACTORS: Age, Fitness, Sex, Time since onset of injury/illness/exacerbation, and 3+ comorbidities: PMH includes hx of breast cancer,  HTN, MCI, insomnia, cataracts, bipolar 1, alcohol use disorder, GAD, MDD, osteopenia of neck of R femur, chronic B low back pain with radiating pain to buttock and posterior thighs and calves. Pt with multiple comorbidities, please refer to  chart for full details  are also affecting patient's functional outcome.   REHAB POTENTIAL: Fair    CLINICAL DECISION MAKING: Evolving/moderate complexity  EVALUATION COMPLEXITY: High  PLAN:  PT FREQUENCY: 2x/week  PT DURATION: 12 weeks  PLANNED INTERVENTIONS: Therapeutic exercises, Therapeutic activity, Neuromuscular re-education, Balance training, Gait training, Patient/Family education, Self Care, Joint mobilization, Stair training, Vestibular training, Canalith repositioning, Visual/preceptual remediation/compensation, Orthotic/Fit training, DME instructions, Wheelchair mobility training, Spinal mobilization, Cryotherapy, Moist heat, Splintting, Taping, and Manual therapy  PLAN FOR NEXT SESSION: hamtrings loading, hip strength, mobility, endurance, gait and balance, cardio   5:36 PM, 05/03/23    Baird Kay, PT 05/03/2023, 5:36 PM  Physical Therapist - [facility]  Outpatient Physical Therapy- Main Campus [phone]

## 2023-05-04 ENCOUNTER — Ambulatory Visit: Payer: Medicare PPO

## 2023-05-11 ENCOUNTER — Ambulatory Visit: Payer: Medicare PPO

## 2023-05-11 DIAGNOSIS — R278 Other lack of coordination: Secondary | ICD-10-CM | POA: Diagnosis not present

## 2023-05-11 DIAGNOSIS — R2681 Unsteadiness on feet: Secondary | ICD-10-CM | POA: Diagnosis not present

## 2023-05-11 DIAGNOSIS — M6281 Muscle weakness (generalized): Secondary | ICD-10-CM

## 2023-05-11 DIAGNOSIS — M25552 Pain in left hip: Secondary | ICD-10-CM

## 2023-05-11 DIAGNOSIS — R262 Difficulty in walking, not elsewhere classified: Secondary | ICD-10-CM

## 2023-05-11 DIAGNOSIS — R42 Dizziness and giddiness: Secondary | ICD-10-CM | POA: Diagnosis not present

## 2023-05-11 NOTE — Therapy (Signed)
OUTPATIENT NEURO PHYSICAL THERAPY TREATMENT    Patient Name: Kendra [name] MRN: [id number] DOB:06/20/1946, 77 y.o., female Today's Date: 05/11/2023  PCP: Erasmo Downer, MD REFERRING PROVIDER: Erasmo Downer, MD  END OF SESSION:  PT End of Session - 05/11/23 1231     Visit Number 51    Number of Visits 60    Date for PT Re-Evaluation 06/08/23    Authorization Type [employer] PPO    Authorization Time Period 11/04/2022-12/30/2022    Progress Note Due on Visit 30    PT Start Time 1146    PT Stop Time 1225    PT Time Calculation (min) 39 min    Equipment Utilized During Treatment Gait belt    Activity Tolerance Patient tolerated treatment well;Patient limited by fatigue;Patient limited by pain    Behavior During Therapy [facility] for tasks assessed/performed                         Past Medical History:  Diagnosis Date   Anxiety    Breast cancer (HCC) 03/2018   right breast cancer at 11:00 and 1:00   Bursitis    bilateral hips and knees   Complication of anesthesia    Depression    Family history of breast cancer 03/24/2018   Family history of lung cancer 03/24/2018   History of alcohol dependence (HCC) 2007   no ETOH; resolved since 2007   History of hiatal hernia    History of psychosis 2014   due to sleep disturbance   Hypertension    Parkinson disease    Personal history of radiation therapy 06/2018-07/2018   right breast ca   PONV (postoperative nausea and vomiting)    Past Surgical History:  Procedure Laterality Date   BREAST BIOPSY Right 03/14/2018   11:00 DCIS and invasive ductal carcinoma   BREAST BIOPSY Right 03/14/2018   1:00 Invasive ductal carcinoma   BREAST LUMPECTOMY Right 04/27/2018   lumpectomy of 11 and 1:00 cancers, clear margins, negative LN   BREAST LUMPECTOMY WITH RADIOACTIVE SEED AND SENTINEL LYMPH NODE BIOPSY Right 04/27/2018   Procedure: RIGHT BREAST LUMPECTOMY WITH RADIOACTIVE SEED X 2 AND RIGHT SENTINEL  LYMPH NODE BIOPSY;  Surgeon: Glenna Fellows, MD;  Location: [facility];  Service: General;  Laterality: Right;   CATARACT EXTRACTION W/PHACO Left 08/30/2019   Procedure: CATARACT EXTRACTION PHACO AND INTRAOCULAR LENS PLACEMENT (IOC) LEFT panoptix toric  01:20.5  12.7%  10.26;  Surgeon: Lockie Mola, MD;  Location: [facility];  Service: Ophthalmology;  Laterality: Left;   CATARACT EXTRACTION W/PHACO Right 09/20/2019   Procedure: CATARACT EXTRACTION PHACO AND INTRAOCULAR LENS PLACEMENT (IOC) RIGHT 5.71  00:36.4  15.7%;  Surgeon: Lockie Mola, MD;  Location: [facility];  Service: Ophthalmology;  Laterality: Right;   LAPAROTOMY N/A 02/10/2018   Procedure: EXPLORATORY LAPAROTOMY;  Surgeon: Shonna Chock, MD;  Location: [facility];  Service: Gynecology;  Laterality: N/A;   MASS EXCISION  01/2018   abdominal   OVARIAN CYST REMOVAL     SALPINGOOPHORECTOMY Bilateral 02/10/2018   Procedure: BILATERAL SALPINGO OOPHORECTOMY; PERITONEAL WASHINGS;  Surgeon: Shonna Chock, MD;  Location: [facility];  Service: Gynecology;  Laterality: Bilateral;   TONSILLECTOMY     TONSILLECTOMY AND ADENOIDECTOMY  1953   Patient Active Problem List   Diagnosis Date Noted   Prediabetes 12/15/2022   Chronic cough 08/17/2022   History of allergy to shellfish 02/24/2022   Chronic low back pain  of over 3 months duration 02/04/2022   Osteoarthritis of  AC (acromioclavicular) joint (Left) 02/04/2022   Osteoarthritis of hip (Right) 02/04/2022   Impaction fracture of distal end of radius and ulna, sequela (Left) 02/04/2022   Osteoarthritis of 1st (thumb) carpometacarpal (CMC) joint of hand (Left) 02/04/2022   Lumbosacral facet arthropathy (Multilevel) (Bilateral) (T12-S1) 02/04/2022   Lumbar central spinal stenosis w/o neurogenic claudication (L2-3, L3-4, L4-5) 02/04/2022   Lumbar foraminal stenosis (Bilateral: L2-3, L4-5) (Right: (Severe) L4-5) 02/04/2022   Lumbar lateral recess  stenosis (Bilateral: L2-3, L3-4, and L4-5) (Severe) 02/04/2022   DDD (degenerative disc disease), lumbar 02/04/2022   Lumbosacral facet syndrome (Multilevel) (Bilateral) 02/04/2022   Osteoarthritis of lumbar spine 02/04/2022   Allergic conjunctivitis of both eyes 12/23/2021   Chronic hip pain (3ry area of Pain) (Bilateral) (L>R) 12/22/2021   Chronic lower extremity pain (2ry area of Pain) (Bilateral) (L>R) 12/22/2021   Abnormal MRI, lumbar spine (01/31/2020) 12/22/2021   Grade 1 Anterolisthesis of lumbar spine (L3/L4) & (5 mm) (L4/L5) 12/22/2021   Chronic pain syndrome 11/17/2021   High risk medication use 11/17/2021   Disorder of skeletal system 11/17/2021   Problems influencing health status 11/17/2021   Other hyperlipidemia 10/21/2021   Polypharmacy 08/12/2021   Chronic low back pain (1ry area of Pain) (Bilateral) (L>R) w/o sciatica 06/18/2021   Osteopenia of neck of femur (Right) 09/06/2020   Obesity 08/29/2020   Parkinson disease 02/22/2020   Mucinous cystadenoma 12/24/2019   Bipolar disorder, in full remission, most recent episode depressed (HCC) 11/08/2019   Vitamin D insufficiency 08/24/2019   Insomnia due to mental condition 05/31/2019   Bipolar I disorder, most recent episode depressed (HCC) 05/31/2019   Alcohol use disorder, moderate, in sustained remission (HCC) 05/31/2019   Elevated tumor markers 05/27/2019   GAD (generalized anxiety disorder) 04/10/2019   MDD (major depressive disorder), severe (HCC) 01/31/2019   High serum carbohydrate antigen 19-9 (CA19-9) 12/01/2018   Adjustment disorder with anxious mood 11/24/2018   Essential hypertension 11/24/2018   B12 deficiency 10/17/2018   Incisional hernia, without obstruction or gangrene 09/12/2018   Genetic testing 04/07/2018   Family history of breast cancer 03/24/2018   Family history of lung cancer 03/24/2018   Malignant neoplasm of upper-outer quadrant of right breast in female, estrogen receptor positive (HCC)  03/17/2018   Malignant neoplasm of upper-inner quadrant of right breast in female, estrogen receptor positive (HCC) 03/17/2018   History of psychosis 02/07/2018   History of insomnia 02/07/2018    ONSET DATE: Pt diagnosed with PD in 2019  REFERRING DIAG:  G20 (ICD-10-CM) - Parkinson's disease      THERAPY DIAG:  Pain in left hip  Muscle weakness (generalized)  Difficulty in walking, not elsewhere classified  Unsteadiness on feet  Rationale for Evaluation and Treatment: Rehabilitation  SUBJECTIVE: Pt with continued L hip pain, and reports no falls. Pt reports she has not yet had her pain med for her hip since she usually takes it later in the day.  Pain: possible L hip pain Pt accompanied by: significant other self, brought to session by caregiver  PERTINENT HISTORY:  Pt is a pleasant 77 yo female who was diagnosed with PD in 2019. She is known to clinic and [facility], and was last seen for LSVT BIG Sept-Oct 2022. Pt with chronic pain and has become increasingly sedentary as a result exacerbating mobility difficulties due to PD. Current concerns: Pt reports L and R hip pain and back pain. Pt was being seen by pain  management clinic. Pt has had injections that provided relief at the time. Pt's spouse reports her physician determined surgery was not an option for her pain so the plan is to manage it. Pt spouse thinks pain has resulted in reluctance for pt to move. Pt currently sedentary, doesn't walk much, but when she does she holds on to furniture for support due to balance problems. When pt first stands she must hold on to something due to mix of pain and/or dizziness (described as lightheadedness). Her pain can go from 3/10 to 9/10. It does improve once she starts moving. Pt has increased difficulty with multiple activities including: getting in and out of her bed, getting in and out of her car, freezing, decreased gait speed, decreased balance and cognition, and difficulty using BUE due  to tremors.  Per chart other PMH includes hx of breast cancer, HTN, MCI, insomnia, cataracts, bipolar 1, alcohol use disorder, GAD, MDD, osteopenia of neck of R femur, chronic B low back pain with radiating pain to buttock and posterior thighs and calves. Pt with multiple comorbidities, please refer to chart for full details    PAIN: from eval Are you having pain? Yes: NPRS scale: not rated currently but can reach 9/10 Pain location: L hip and L leg Pain description: pt has difficulty describing Aggravating factors: unsure Relieving factors: laying in the recliner helps, movement  PRECAUTIONS: Fall  WEIGHT BEARING RESTRICTIONS: No  FALLS: Has patient fallen in last 6 months? Yes. Number of falls 1x, leg tangled in chair leg when getting up from a chair  PLOF: Independent with basic ADLs  PATIENT GOALS: improve mobility   BLE Strength Assessment      Right  Left  Hip Flexion 5/5 4/5 (painful)  Hip Horizontal ABDCT  5/5 5/5  Hip Horizontal ADD 4+/5 4+/5  Hip IR (seated)  4+/5 4+/5  Hip ER (seated) 5/5 5/5  Knee Extension 5/5 5/5  Knee Flexion (seated) 4+/5  5/5 (not painful)  Hip ABDCT (supine)  3+/5 4-/5   Focal popliteal edema Left knee, not painful   ROM Assessment Hip     Right  Left   Flexion Supine 132 120 (ouch)  External rotation (90/90) 30 47  Internal Rotation (90/90) 40  20  Supine Hip Extension P/ROM >0 degrees >0 degrees       IMAGING: via chart   07/24/22 DG BONE density " ASSESSMENT: The probability of a major osteoporotic fracture is 15.8% within the next ten years.   The probability of a hip fracture is 2.9% within the next ten years."   12/22/21 DG lumbar spine " IMPRESSION: 1. No acute displaced fracture or traumatic listhesis of the lumbar spine. 2. Interval development of grade 1 anterolisthesis of L3 on L4. Stable grade 1 anterolisthesis of L4 on L5. Appears stable on flexion extension views. 3.  Aortic Atherosclerosis (ICD10-I70.0)."    New from chart:  DG Lumbar spine 01/05/23: "IMPRESSION: 1. Lumbar spine scoliosis concave right. Diffuse multilevel degenerative change. Disc degeneration most prominent at L2-L3 and L5-S1. 5 mm anterolisthesis L4 on L5. Diffuse severe facet hypertrophy. No flexion or extension abnormality identified. No evidence of fracture. 2. Sclerotic densities are noted in the L2 and L4 vertebral bodies. Although these could represent bone islands blastic metastatic disease cannot be excluded. MRI of the lumbar spine suggested for further evaluation. 3. Large amount of stool noted throughout the colon. 4. Aortoiliac atherosclerotic vascular disease."    DG hip 01/05/23: "IMPRESSION: Moderate bilateral femoroacetabular osteoarthritis,  similar to prior."   PATIENT EDUCATION:  Education details: Pt educated throughout session about proper posture and technique with exercises. Improved exercise technique, movement at target joints, use of target muscles after min to mod verbal, visual, tactile cues.  Person educated: Patient and Spouse Education method: Explanation, Demonstration, and Verbal cues Education comprehension: verbalized understanding, returned demonstration, verbal cues required, tactile cues required, and needs further education   TODAY'S TREATMENT:                                                                                                                                         DATE: 05/11/23   Gait belt donned and CGA provided throughout unless specified otherwise  THEREX:    Ambulation for endurance with 4WW and 5# AW 1 x 296 ft. Pt requests brief rest break, and intervention terminated due to L hip pain.  5# weights donned -- seated LAQ 2x15 each LE with 5# AW each LE. Rates medium  Seated adductor squeeze 10x 3 sec hold/rep STS 3x10. Pt rates, "hard" and states, "everything is hard today."  Seated hamstring curl with RTB  each LE - 2x15 each LE. Pt rates moderate Seated  hip abduction with RTB 3x15 bilat LE. Pt rates moderate. PT providing TC to improve AROM Seated heel raise/toe-raise 10-15 of each. Rates medium  Ambulation for endurance (no AW) with 4WW 2x148 ft. Requires rest break. Reports moderate hip pain, but no increase in pain with intervention.   HOME EXERCISE PROGRAM: pt to continue HEP as previously given  Access Code: [email or web address] URL: [email or web address] Date: 12/22/2022 Prepared by: Temple Pacini  Exercises - Sit to Stand with Counter Support  - 1 x daily - 5 x weekly - 3 sets - 10 reps  GOALS: Goals reviewed with patient? Yes  SHORT TERM GOALS: Target date: 04/27/2023    Patient will be independent in home exercise program to improve strength/mobility for better functional independence with ADLs. Baseline: LSVT BIG protocol to be initiated next session; 12/18: not yet fully indep, reports she still gets confused about the technique; 12/03/2022: HEP to be updated; Pt reports practicing HEP 2x/week; 02/23/23: not currently performing HEP, reviewed in session and provided new copy; 03/16/2023: pt reports performing HEP every day; 03/30/23: Pt reports she is not being consistent with her HEP; 7/1: pt reports she is not doing much at home Goal status: MET (previously MET)   LONG TERM GOALS: Target date: 06/08/2023     Patient will increase FOTO score to equal to or greater than 49 to demonstrate statistically significant improvement in mobility and quality of life.  Baseline: 40; 12/18: deferred due to time; 45; 3/19: deferred to next visit; 01/21/2023; 02/23/2023: 46 unchanged from previous assessment); 03/17/2023: 48 Goal status: PARTIALLY MET  2.  Patient (> 19 years old) will complete five times sit to stand test in < 15 seconds indicating decreased fall risk.  Baseline: 27 sec; 12/18 23 sec hands-free; 11/04/22: 24 sec hands free; 2/1/124: 22.7 sec use of BUE, 21.3 sec hands-free; 01/19/23: 18 seconds hands-free; 02/23/23: 19 seconds  hands-free; 03/16/2023: 18 seconds hands-free; 03/30/23: 17.3 seconds hands-free; 05/03/23: 17.3 seconds hands-free  Goal status: IN PROGRESS  3.  Patient will increase 10 meter walk test to >1.29m/s with WFL B step-length, upright posture for >90% of test to improve ease with gait and to override hypokentic and bradykinetic movement.  Baseline: 0.58 m/s with 4UJ; 12/18: 0.7 m/s with 8JX; 11/04/22: 0.69 m/s with 9JY;  12/03/22: 0.73 m/s with 7WG; 01/19/2023: 0.77 m/s with 9FA; 02/23/2023: 0.83 m/s with 2ZH; 03/16/2023: 0.83 m/s with 0QM , pt still somewhat crouched, requires cues for upright posture >50% of time; 03/30/23: 0.83 m/s with crouched posture; 05/03/23 0.8 m/s with RW, some improvement with upright posture  Goal status: IN PROGRESS  4.  Patient will reduce time to don jacket by at least 5 seconds in order to increase ease with dressing and override bradykinetic movement Baseline: 17 seconds; 12/18: 12 seconds Goal status: MET  5.  Pt will demo or report ability to open standard jar using only 1-2 attempts in order to exhibit improved functional mobility for ADLs.  Baseline: 5.7 seconds with multiple attempts to turn jar lid to open; 12/18: 2.8 sec opens in 2 turns Goal status: MET  6.  Patient will increase Berg Balance score by > 6 points to demonstrate decreased fall risk during functional activities. Baseline: to be tested next 1-2 visits; 12/03/2022: 44/56; 01/21/23 45/56; 48/56; 03/17/2023: 48/56; 05/03/23: 50/56 Goal status: Partially met   7.  Patient will report at least 4 days in a row with minimal to no L hip pain in order to improve QOL and ease/safety with functional mobility. Baseline: Pt reports pain majority of days in L hip; 12/03/2022: pt reports having hip pain about every day; 3/19: rates hip pain daily (being seen now by MD, new imaging); 02/23/2023: pt went for period of time without hip pain (she thinks a week), but reports it has now come back,; 5/28: pt reports hip pain "every other  day"; 05/03/23: pt reports pain in hip every day in past week Goal status: NOT MET/discontinued   8. Patient will increase six minute walk test distance to >1000 for progression to community ambulator and improve gait ability Baseline:02/23/2023: 722 ft with 4WW and two standing rest breaks, rates test as hard; 03/16/2023: deferred to next visit due to time; 5/28/20204: 641 ft with 4WW, pt requests to end test early due to fatigue at 5 min 35 sec; 05/03/23: 645 ft with 4WW and able to complete full test  Goal status: IN PROGRESS  ASSESSMENT:  CLINICAL IMPRESSION: Standing and gait activities limited today secondary to L hip pain. Pt did report to PT that she had not yet had pain medication today for her hip, which she says she typically has around 12:30.  Pt still motivated to participate, however, and was able to complete multiple seated interventions pain-free, and even completed two more sets of gait without ankle weights. . The pt will benefit from further skilled PT to improve strength, balance, gait and mobility.  OBJECTIVE IMPAIRMENTS: Abnormal gait, decreased balance, decreased cognition, decreased coordination, decreased endurance, decreased knowledge of use of DME, decreased mobility, difficulty walking, decreased strength, dizziness, hypomobility, impaired UE functional use, improper body mechanics, postural dysfunction, and pain.   ACTIVITY LIMITATIONS: lifting, bending, standing, squatting, stairs, transfers, bed mobility, bathing, dressing, hygiene/grooming,  and locomotion level  PARTICIPATION LIMITATIONS: meal prep, cleaning, laundry, medication management, personal finances, driving, shopping, community activity, and yard work  PERSONAL FACTORS: Age, Fitness, Sex, Time since onset of injury/illness/exacerbation, and 3+ comorbidities: PMH includes hx of breast cancer, HTN, MCI, insomnia, cataracts, bipolar 1, alcohol use disorder, GAD, MDD, osteopenia of neck of R femur, chronic B low back  pain with radiating pain to buttock and posterior thighs and calves. Pt with multiple comorbidities, please refer to chart for full details  are also affecting patient's functional outcome.   REHAB POTENTIAL: Fair    CLINICAL DECISION MAKING: Evolving/moderate complexity  EVALUATION COMPLEXITY: High  PLAN:  PT FREQUENCY: 2x/week  PT DURATION: 12 weeks  PLANNED INTERVENTIONS: Therapeutic exercises, Therapeutic activity, Neuromuscular re-education, Balance training, Gait training, Patient/Family education, Self Care, Joint mobilization, Stair training, Vestibular training, Canalith repositioning, Visual/preceptual remediation/compensation, Orthotic/Fit training, DME instructions, Wheelchair mobility training, Spinal mobilization, Cryotherapy, Moist heat, Splintting, Taping, and Manual therapy  PLAN FOR NEXT SESSION: hamtrings loading, hip strength, mobility, endurance, gait and balance, cardio   12:32 PM, 05/11/23    Baird Kay, PT 05/11/2023, 12:32 PM  Physical Therapist - [facility]  Outpatient Physical Therapy- [facility] [phone]

## 2023-05-12 DIAGNOSIS — F319 Bipolar disorder, unspecified: Secondary | ICD-10-CM | POA: Diagnosis not present

## 2023-05-12 DIAGNOSIS — G2401 Drug induced subacute dyskinesia: Secondary | ICD-10-CM | POA: Diagnosis not present

## 2023-05-12 DIAGNOSIS — G20C Parkinsonism, unspecified: Secondary | ICD-10-CM | POA: Diagnosis not present

## 2023-05-12 DIAGNOSIS — M25559 Pain in unspecified hip: Secondary | ICD-10-CM | POA: Diagnosis not present

## 2023-05-13 ENCOUNTER — Ambulatory Visit (INDEPENDENT_AMBULATORY_CARE_PROVIDER_SITE_OTHER): Payer: Medicare PPO | Admitting: Family Medicine

## 2023-05-13 ENCOUNTER — Encounter: Payer: Self-pay | Admitting: Family Medicine

## 2023-05-13 ENCOUNTER — Ambulatory Visit: Payer: Medicare PPO

## 2023-05-13 VITALS — BP 125/72 | HR 79 | Temp 97.6°F | Resp 16 | Ht 66.0 in | Wt 222.3 lb

## 2023-05-13 DIAGNOSIS — R7303 Prediabetes: Secondary | ICD-10-CM

## 2023-05-13 DIAGNOSIS — E559 Vitamin D deficiency, unspecified: Secondary | ICD-10-CM

## 2023-05-13 DIAGNOSIS — R278 Other lack of coordination: Secondary | ICD-10-CM

## 2023-05-13 DIAGNOSIS — N319 Neuromuscular dysfunction of bladder, unspecified: Secondary | ICD-10-CM

## 2023-05-13 DIAGNOSIS — E7849 Other hyperlipidemia: Secondary | ICD-10-CM

## 2023-05-13 DIAGNOSIS — H1013 Acute atopic conjunctivitis, bilateral: Secondary | ICD-10-CM | POA: Diagnosis not present

## 2023-05-13 DIAGNOSIS — I10 Essential (primary) hypertension: Secondary | ICD-10-CM

## 2023-05-13 DIAGNOSIS — R42 Dizziness and giddiness: Secondary | ICD-10-CM | POA: Diagnosis not present

## 2023-05-13 DIAGNOSIS — R053 Chronic cough: Secondary | ICD-10-CM | POA: Diagnosis not present

## 2023-05-13 DIAGNOSIS — M6281 Muscle weakness (generalized): Secondary | ICD-10-CM

## 2023-05-13 DIAGNOSIS — R2681 Unsteadiness on feet: Secondary | ICD-10-CM | POA: Diagnosis not present

## 2023-05-13 DIAGNOSIS — R262 Difficulty in walking, not elsewhere classified: Secondary | ICD-10-CM

## 2023-05-13 DIAGNOSIS — M25552 Pain in left hip: Secondary | ICD-10-CM

## 2023-05-13 NOTE — Progress Notes (Signed)
I,Sulibeya S Dimas,acting as a Neurosurgeon for Shirlee Latch, MD.,have documented all relevant documentation on the behalf of Shirlee Latch, MD,as directed by  Shirlee Latch, MD while in the presence of Shirlee Latch, MD.     Established patient visit   Patient: Kendra [name]   DOB: Oct 09, 1946   77 y.o. Female  MRN: [id number] Visit Date: 05/13/2023  Today's healthcare provider: Shirlee Latch, MD   Chief Complaint  Patient presents with   Medical Management of Chronic Issues   Subjective    HPI  Hypertension, follow-up  BP Readings from Last 3 Encounters:  05/13/23 125/72  03/01/23 121/71  02/09/23 127/76   Wt Readings from Last 3 Encounters:  05/13/23 222 lb 4.8 oz (100.8 kg)  03/01/23 223 lb 3.2 oz (101.2 kg)  02/09/23 223 lb 12.8 oz (101.5 kg)     She was last seen for hypertension 3 months ago.  BP at that visit was 121/71. Management since that visit include switching lisinopril to losartan due to cough. Husband reports patient is still coughing.   She reports excellent compliance with treatment. She is not having side effects.   Outside blood pressures are not being checked. Symptoms: No chest pain No chest pressure  No palpitations No syncope  No dyspnea No orthopnea  No paroxysmal nocturnal dyspnea Yes lower extremity edema   Pertinent labs Lab Results  Component Value Date   CHOL 191 11/06/2022   HDL 58 11/06/2022   LDLCALC 113 (H) 11/06/2022   TRIG 112 11/06/2022   CHOLHDL 3.3 12/17/2021   Lab Results  Component Value Date   NA 131 (L) 03/01/2023   K 3.6 03/01/2023   CREATININE 0.85 03/01/2023   GFRNONAA >60 03/01/2023   GLUCOSE 157 (H) 03/01/2023   TSH 1.653 01/04/2023     The 10-year ASCVD risk score (Arnett DK, et al., 2019) is: 20.1%  --------------------------------------------------------------------------------------------------- Discussed the use of AI scribe software for clinical note transcription with the  patient, who gave verbal consent to proceed.  History of Present Illness   A patient with a history of Parkinson's disease and hypertension, presents with a persistent cough and urinary incontinence. The cough has remained unchanged in frequency and intensity despite a change in blood pressure medication from lisinopril to losartan and the use of a nose spray for potential postnasal drip. The patient denies any smoking history. The patient also reports urinary incontinence, which occurs unpredictably and without sensation. The patient has been managing this by going to the bathroom at timed intervals every 30 minutes and wearing a pad. The patient's blood pressure is well-managed with losartan HCTZ. The patient's blood sugar and cholesterol levels are due for monitoring, but the patient plans to reduce sugar intake before the next test.       Medications: Outpatient Medications Prior to Visit  Medication Sig   Acetaminophen 500 MG capsule Take 1,000 mg by mouth 2 (two) times daily.   AMBULATORY NON FORMULARY MEDICATION 1 Bag by Other route daily. [facility] smart dispenser   carbidopa-levodopa (SINEMET IR) 25-100 MG tablet Take 2 tablets by mouth with breakfast, with lunch, and with evening meal.   Carbidopa-Levodopa ER (SINEMET CR) 25-100 MG tablet controlled release Take 2 tablets by mouth at bedtime.   cyanocobalamin (VITAMIN B12) 1000 MCG/ML injection INJECT 1 ML INTO THE MUSCLE EVERY 30 DAYS   denosumab (PROLIA) 60 MG/ML SOSY injection Inject 60 mg into the skin every 6 (six) months. takes annually   FLUoxetine HCl 60 MG TABS  TAKE 1 TABLET BY MOUTH DAILY   gabapentin (NEURONTIN) 100 MG capsule Take 200 mg by mouth 3 (three) times daily.   letrozole (FEMARA) 2.5 MG tablet TAKE 1 TABLET BY MOUTH DAILY   losartan-hydrochlorothiazide (HYZAAR) 50-12.5 MG tablet Take 1 tablet by mouth daily. Stop lisinopril-hctz - this replaced that medication   risperiDONE (RISPERDAL) 0.5 MG tablet Take 1 tablet (0.5 mg  total) by mouth 2 (two) times daily.   traZODone (DESYREL) 100 MG tablet Take 1 tablet (100 mg total) by mouth at bedtime.   No facility-administered medications prior to visit.    Review of Systems  Constitutional:  Negative for appetite change.  Respiratory:  Positive for cough. Negative for chest tightness, shortness of breath and wheezing.   Cardiovascular:  Positive for leg swelling. Negative for chest pain.       Objective    BP 125/72 (BP Location: Left Arm, Patient Position: Sitting, Cuff Size: Large)   Pulse 79   Temp 97.6 F (36.4 C) (Temporal)   Resp 16   Ht 5\' 6"  (1.676 m)   Wt 222 lb 4.8 oz (100.8 kg)   SpO2 95%   BMI 35.88 kg/m    Physical Exam Vitals reviewed.  Constitutional:      General: She is not in acute distress.    Appearance: Normal appearance. She is well-developed. She is not diaphoretic.  HENT:     Head: Normocephalic and atraumatic.  Eyes:     General: No scleral icterus.    Conjunctiva/sclera: Conjunctivae normal.  Neck:     Thyroid: No thyromegaly.  Cardiovascular:     Rate and Rhythm: Normal rate and regular rhythm.     Heart sounds: Normal heart sounds. No murmur heard. Pulmonary:     Effort: Pulmonary effort is normal. No respiratory distress.     Breath sounds: Normal breath sounds. No wheezing, rhonchi or rales.  Musculoskeletal:     Cervical back: Neck supple.     Right lower leg: No edema.     Left lower leg: No edema.  Lymphadenopathy:     Cervical: No cervical adenopathy.  Skin:    General: Skin is warm and dry.     Findings: No rash.  Neurological:     Mental Status: She is alert and oriented to person, place, and time. Mental status is at baseline.  Psychiatric:        Mood and Affect: Mood normal.        Behavior: Behavior normal.       No results found for any visits on 05/13/23.  Assessment & Plan     Problem List Items Addressed This Visit       Cardiovascular and Mediastinum   Essential hypertension -  Primary    Well-controlled on Losartan-HCTZ combo pill. -Continue current medication regimen.        Other   Vitamin D insufficiency   Other hyperlipidemia   Allergic conjunctivitis of both eyes   Chronic cough    Persistent despite switching from Lisinopril to Losartan. No improvement with nasal spray or reflux medication. Normal chest x-ray. -Consider referral to pulmonology for further evaluation if cough worsens or becomes more frequent.      Prediabetes   Neurogenic bladder    Reports loss of sensation and control of bladder, likely neurogenic secondary to Parkinson's disease. -Continue timed voids every 30 minutes and wearing a pad. -Consider reducing caffeine intake to minimize bladder irritation.  Hyperlipidemia and PreDiabetes: Last checked six months ago. -Plan to check cholesterol and A1c at next visit in six months. - reports lifestyle changes  General Health Maintenance / Followup Plans: -Consider implementing an anti-inflammatory diet and reducing sugar intake. -Continue regular exercise to improve lung capacity. -Plan for follow-up visit in six months with blood work.        Return in about 6 months (around 11/13/2023) for chronic disease f/u.      I, Shirlee Latch, MD, have reviewed all documentation for this visit. The documentation on 05/14/23 for the exam, diagnosis, procedures, and orders are all accurate and complete.   Maxcine Strong, Marzella Schlein, MD, MPH [facility]

## 2023-05-13 NOTE — Therapy (Signed)
OUTPATIENT NEURO PHYSICAL THERAPY TREATMENT    Patient Name: Kendra [name] MRN: [id number] DOB:May 25, 1946, 77 y.o., female Today's Date: 05/13/2023  PCP: Erasmo Downer, MD REFERRING PROVIDER: Erasmo Downer, MD  END OF SESSION:  PT End of Session - 05/13/23 1309     Visit Number 52    Number of Visits 60    Date for PT Re-Evaluation 06/08/23    Authorization Type [employer] PPO    Authorization Time Period 11/04/2022-12/30/2022    Progress Note Due on Visit 30    PT Start Time 1316    PT Stop Time 1359    PT Time Calculation (min) 43 min    Equipment Utilized During Treatment Gait belt    Activity Tolerance Patient tolerated treatment well;Patient limited by fatigue    Behavior During Therapy WFL for tasks assessed/performed                         Past Medical History:  Diagnosis Date   Anxiety    Breast cancer (HCC) 03/2018   right breast cancer at 11:00 and 1:00   Bursitis    bilateral hips and knees   Complication of anesthesia    Depression    Family history of breast cancer 03/24/2018   Family history of lung cancer 03/24/2018   History of alcohol dependence (HCC) 2007   no ETOH; resolved since 2007   History of hiatal hernia    History of psychosis 2014   due to sleep disturbance   Hypertension    Parkinson disease    Personal history of radiation therapy 06/2018-07/2018   right breast ca   PONV (postoperative nausea and vomiting)    Past Surgical History:  Procedure Laterality Date   BREAST BIOPSY Right 03/14/2018   11:00 DCIS and invasive ductal carcinoma   BREAST BIOPSY Right 03/14/2018   1:00 Invasive ductal carcinoma   BREAST LUMPECTOMY Right 04/27/2018   lumpectomy of 11 and 1:00 cancers, clear margins, negative LN   BREAST LUMPECTOMY WITH RADIOACTIVE SEED AND SENTINEL LYMPH NODE BIOPSY Right 04/27/2018   Procedure: RIGHT BREAST LUMPECTOMY WITH RADIOACTIVE SEED X 2 AND RIGHT SENTINEL LYMPH NODE BIOPSY;   Surgeon: Glenna Fellows, MD;  Location: [facility];  Service: General;  Laterality: Right;   CATARACT EXTRACTION W/PHACO Left 08/30/2019   Procedure: CATARACT EXTRACTION PHACO AND INTRAOCULAR LENS PLACEMENT (IOC) LEFT panoptix toric  01:20.5  12.7%  10.26;  Surgeon: Lockie Mola, MD;  Location: [facility];  Service: Ophthalmology;  Laterality: Left;   CATARACT EXTRACTION W/PHACO Right 09/20/2019   Procedure: CATARACT EXTRACTION PHACO AND INTRAOCULAR LENS PLACEMENT (IOC) RIGHT 5.71  00:36.4  15.7%;  Surgeon: Lockie Mola, MD;  Location: [facility];  Service: Ophthalmology;  Laterality: Right;   LAPAROTOMY N/A 02/10/2018   Procedure: EXPLORATORY LAPAROTOMY;  Surgeon: Shonna Chock, MD;  Location: [facility];  Service: Gynecology;  Laterality: N/A;   MASS EXCISION  01/2018   abdominal   OVARIAN CYST REMOVAL     SALPINGOOPHORECTOMY Bilateral 02/10/2018   Procedure: BILATERAL SALPINGO OOPHORECTOMY; PERITONEAL WASHINGS;  Surgeon: Shonna Chock, MD;  Location: [facility];  Service: Gynecology;  Laterality: Bilateral;   TONSILLECTOMY     TONSILLECTOMY AND ADENOIDECTOMY  1953   Patient Active Problem List   Diagnosis Date Noted   Prediabetes 12/15/2022   Chronic cough 08/17/2022   History of allergy to shellfish 02/24/2022   Chronic low back pain of over 3  months duration 02/04/2022   Osteoarthritis of  AC (acromioclavicular) joint (Left) 02/04/2022   Osteoarthritis of hip (Right) 02/04/2022   Impaction fracture of distal end of radius and ulna, sequela (Left) 02/04/2022   Osteoarthritis of 1st (thumb) carpometacarpal (CMC) joint of hand (Left) 02/04/2022   Lumbosacral facet arthropathy (Multilevel) (Bilateral) (T12-S1) 02/04/2022   Lumbar central spinal stenosis w/o neurogenic claudication (L2-3, L3-4, L4-5) 02/04/2022   Lumbar foraminal stenosis (Bilateral: L2-3, L4-5) (Right: (Severe) L4-5) 02/04/2022   Lumbar lateral recess stenosis (Bilateral:  L2-3, L3-4, and L4-5) (Severe) 02/04/2022   DDD (degenerative disc disease), lumbar 02/04/2022   Lumbosacral facet syndrome (Multilevel) (Bilateral) 02/04/2022   Osteoarthritis of lumbar spine 02/04/2022   Allergic conjunctivitis of both eyes 12/23/2021   Chronic hip pain (3ry area of Pain) (Bilateral) (L>R) 12/22/2021   Chronic lower extremity pain (2ry area of Pain) (Bilateral) (L>R) 12/22/2021   Abnormal MRI, lumbar spine (01/31/2020) 12/22/2021   Grade 1 Anterolisthesis of lumbar spine (L3/L4) & (5 mm) (L4/L5) 12/22/2021   Chronic pain syndrome 11/17/2021   High risk medication use 11/17/2021   Disorder of skeletal system 11/17/2021   Problems influencing health status 11/17/2021   Other hyperlipidemia 10/21/2021   Polypharmacy 08/12/2021   Chronic low back pain (1ry area of Pain) (Bilateral) (L>R) w/o sciatica 06/18/2021   Osteopenia of neck of femur (Right) 09/06/2020   Obesity 08/29/2020   Parkinson disease 02/22/2020   Mucinous cystadenoma 12/24/2019   Bipolar disorder, in full remission, most recent episode depressed (HCC) 11/08/2019   Vitamin D insufficiency 08/24/2019   Insomnia due to mental condition 05/31/2019   Bipolar I disorder, most recent episode depressed (HCC) 05/31/2019   Alcohol use disorder, moderate, in sustained remission (HCC) 05/31/2019   Elevated tumor markers 05/27/2019   GAD (generalized anxiety disorder) 04/10/2019   MDD (major depressive disorder), severe (HCC) 01/31/2019   High serum carbohydrate antigen 19-9 (CA19-9) 12/01/2018   Adjustment disorder with anxious mood 11/24/2018   Essential hypertension 11/24/2018   B12 deficiency 10/17/2018   Incisional hernia, without obstruction or gangrene 09/12/2018   Genetic testing 04/07/2018   Family history of breast cancer 03/24/2018   Family history of lung cancer 03/24/2018   Malignant neoplasm of upper-outer quadrant of right breast in female, estrogen receptor positive (HCC) 03/17/2018   Malignant  neoplasm of upper-inner quadrant of right breast in female, estrogen receptor positive (HCC) 03/17/2018   History of psychosis 02/07/2018   History of insomnia 02/07/2018    ONSET DATE: Pt diagnosed with PD in 2019  REFERRING DIAG:  G20 (ICD-10-CM) - Parkinson's disease      THERAPY DIAG:  Pain in left hip  Muscle weakness (generalized)  Difficulty in walking, not elsewhere classified  Unsteadiness on feet  Other lack of coordination  Rationale for Evaluation and Treatment: Rehabilitation  SUBJECTIVE: Pt reports she is still feeling tired, but is overall feeling better than last time.  Pain: possible L hip pain Pt accompanied by: significant other self, brought to session by caregiver  PERTINENT HISTORY:  Pt is a pleasant 77 yo female who was diagnosed with PD in 2019. She is known to clinic and [facility], and was last seen for LSVT BIG Sept-Oct 2022. Pt with chronic pain and has become increasingly sedentary as a result exacerbating mobility difficulties due to PD. Current concerns: Pt reports L and R hip pain and back pain. Pt was being seen by pain management clinic. Pt has had injections that provided relief at the time. Pt's spouse reports  her physician determined surgery was not an option for her pain so the plan is to manage it. Pt spouse thinks pain has resulted in reluctance for pt to move. Pt currently sedentary, doesn't walk much, but when she does she holds on to furniture for support due to balance problems. When pt first stands she must hold on to something due to mix of pain and/or dizziness (described as lightheadedness). Her pain can go from 3/10 to 9/10. It does improve once she starts moving. Pt has increased difficulty with multiple activities including: getting in and out of her bed, getting in and out of her car, freezing, decreased gait speed, decreased balance and cognition, and difficulty using BUE due to tremors.  Per chart other PMH includes hx of breast  cancer, HTN, MCI, insomnia, cataracts, bipolar 1, alcohol use disorder, GAD, MDD, osteopenia of neck of R femur, chronic B low back pain with radiating pain to buttock and posterior thighs and calves. Pt with multiple comorbidities, please refer to chart for full details    PAIN: from eval Are you having pain? Yes: NPRS scale: not rated currently but can reach 9/10 Pain location: L hip and L leg Pain description: pt has difficulty describing Aggravating factors: unsure Relieving factors: laying in the recliner helps, movement  PRECAUTIONS: Fall  WEIGHT BEARING RESTRICTIONS: No  FALLS: Has patient fallen in last 6 months? Yes. Number of falls 1x, leg tangled in chair leg when getting up from a chair  PLOF: Independent with basic ADLs  PATIENT GOALS: improve mobility   BLE Strength Assessment      Right  Left  Hip Flexion 5/5 4/5 (painful)  Hip Horizontal ABDCT  5/5 5/5  Hip Horizontal ADD 4+/5 4+/5  Hip IR (seated)  4+/5 4+/5  Hip ER (seated) 5/5 5/5  Knee Extension 5/5 5/5  Knee Flexion (seated) 4+/5  5/5 (not painful)  Hip ABDCT (supine)  3+/5 4-/5   Focal popliteal edema Left knee, not painful   ROM Assessment Hip     Right  Left   Flexion Supine 132 120 (ouch)  External rotation (90/90) 30 47  Internal Rotation (90/90) 40  20  Supine Hip Extension P/ROM >0 degrees >0 degrees       IMAGING: via chart   07/24/22 DG BONE density " ASSESSMENT: The probability of a major osteoporotic fracture is 15.8% within the next ten years.   The probability of a hip fracture is 2.9% within the next ten years."   12/22/21 DG lumbar spine " IMPRESSION: 1. No acute displaced fracture or traumatic listhesis of the lumbar spine. 2. Interval development of grade 1 anterolisthesis of L3 on L4. Stable grade 1 anterolisthesis of L4 on L5. Appears stable on flexion extension views. 3.  Aortic Atherosclerosis (ICD10-I70.0)."   New from chart:  DG Lumbar spine 01/05/23:  "IMPRESSION: 1. Lumbar spine scoliosis concave right. Diffuse multilevel degenerative change. Disc degeneration most prominent at L2-L3 and L5-S1. 5 mm anterolisthesis L4 on L5. Diffuse severe facet hypertrophy. No flexion or extension abnormality identified. No evidence of fracture. 2. Sclerotic densities are noted in the L2 and L4 vertebral bodies. Although these could represent bone islands blastic metastatic disease cannot be excluded. MRI of the lumbar spine suggested for further evaluation. 3. Large amount of stool noted throughout the colon. 4. Aortoiliac atherosclerotic vascular disease."    DG hip 01/05/23: "IMPRESSION: Moderate bilateral femoroacetabular osteoarthritis, similar to prior."   PATIENT EDUCATION:  Education details: Pt educated throughout session about  proper posture and technique with exercises. Improved exercise technique, movement at target joints, use of target muscles after min to mod verbal, visual, tactile cues.  Person educated: Patient and Spouse Education method: Explanation, Demonstration, and Verbal cues Education comprehension: verbalized understanding, returned demonstration, verbal cues required, tactile cues required, and needs further education   TODAY'S TREATMENT:                                                                                                                                         DATE: 05/13/23   Gait belt donned and CGA provided throughout unless specified otherwise  THEREX:    Ambulation for endurance with 4WW and 5# AW 1 x 444 ft. Pt takes seated rest break stating, "I'm exhausted." Ambulation for endurance with 4WW and 5# AW 1 x 296 ft. Terminated, pt reports L hip bothering her. STS + chest press with 3.5# bar 2x10    NMR: Ball toss with PT for timing/sequencing, pt seated x 4 min. Pt rates medium, reports challenge with catching ball, however, exhibits good ability to accurately catch/good sequencing/timing  NBOS  basketball dynamic balance activity with close CGA x several minutes. Pt rates challenging, requires up to min assist to correct for 1 instance of LOB  Standing unilateral FWD step with BUE reach with open hand 2x10 each LE position. CGA- min a throughout. Cuing for big open hand and larger amplitude "stomp."  Standing LTL side step and reach 2x12 each way. Requires rest breaks between sets due to fatigue     HOME EXERCISE PROGRAM: pt to continue HEP as previously given  Access Code: [email or web address] URL: [email or web address] Date: 12/22/2022 Prepared by: Temple Pacini  Exercises - Sit to Stand with Counter Support  - 1 x daily - 5 x weekly - 3 sets - 10 reps  GOALS: Goals reviewed with patient? Yes  SHORT TERM GOALS: Target date: 04/27/2023    Patient will be independent in home exercise program to improve strength/mobility for better functional independence with ADLs. Baseline: LSVT BIG protocol to be initiated next session; 12/18: not yet fully indep, reports she still gets confused about the technique; 12/03/2022: HEP to be updated; Pt reports practicing HEP 2x/week; 02/23/23: not currently performing HEP, reviewed in session and provided new copy; 03/16/2023: pt reports performing HEP every day; 03/30/23: Pt reports she is not being consistent with her HEP; 7/1: pt reports she is not doing much at home Goal status: MET (previously MET)   LONG TERM GOALS: Target date: 06/08/2023     Patient will increase FOTO score to equal to or greater than 49 to demonstrate statistically significant improvement in mobility and quality of life.  Baseline: 40; 12/18: deferred due to time; 45; 3/19: deferred to next visit; 01/21/2023; 02/23/2023: 46 unchanged from previous assessment); 03/17/2023: 48 Goal status: PARTIALLY MET  2.  Patient (> 1 years old) will complete five times  sit to stand test in < 15 seconds indicating decreased fall risk.  Baseline: 27 sec; 12/18 23 sec hands-free;  11/04/22: 24 sec hands free; 2/1/124: 22.7 sec use of BUE, 21.3 sec hands-free; 01/19/23: 18 seconds hands-free; 02/23/23: 19 seconds hands-free; 03/16/2023: 18 seconds hands-free; 03/30/23: 17.3 seconds hands-free; 05/03/23: 17.3 seconds hands-free  Goal status: IN PROGRESS  3.  Patient will increase 10 meter walk test to >1.17m/s with WFL B step-length, upright posture for >90% of test to improve ease with gait and to override hypokentic and bradykinetic movement.  Baseline: 0.58 m/s with 1HY; 12/18: 0.7 m/s with 8MV; 11/04/22: 0.69 m/s with 7QI;  12/03/22: 0.73 m/s with 6NG; 01/19/2023: 0.77 m/s with 2XB; 02/23/2023: 0.83 m/s with 2WU; 03/16/2023: 0.83 m/s with 1LK , pt still somewhat crouched, requires cues for upright posture >50% of time; 03/30/23: 0.83 m/s with crouched posture; 05/03/23 0.8 m/s with RW, some improvement with upright posture  Goal status: IN PROGRESS  4.  Patient will reduce time to don jacket by at least 5 seconds in order to increase ease with dressing and override bradykinetic movement Baseline: 17 seconds; 12/18: 12 seconds Goal status: MET  5.  Pt will demo or report ability to open standard jar using only 1-2 attempts in order to exhibit improved functional mobility for ADLs.  Baseline: 5.7 seconds with multiple attempts to turn jar lid to open; 12/18: 2.8 sec opens in 2 turns Goal status: MET  6.  Patient will increase Berg Balance score by > 6 points to demonstrate decreased fall risk during functional activities. Baseline: to be tested next 1-2 visits; 12/03/2022: 44/56; 01/21/23 45/56; 48/56; 03/17/2023: 48/56; 05/03/23: 50/56 Goal status: Partially met   7.  Patient will report at least 4 days in a row with minimal to no L hip pain in order to improve QOL and ease/safety with functional mobility. Baseline: Pt reports pain majority of days in L hip; 12/03/2022: pt reports having hip pain about every day; 3/19: rates hip pain daily (being seen now by MD, new imaging); 02/23/2023: pt went for  period of time without hip pain (she thinks a week), but reports it has now come back,; 5/28: pt reports hip pain "every other day"; 05/03/23: pt reports pain in hip every day in past week Goal status: NOT MET/discontinued   8. Patient will increase six minute walk test distance to >1000 for progression to community ambulator and improve gait ability Baseline:02/23/2023: 722 ft with 4WW and two standing rest breaks, rates test as hard; 03/16/2023: deferred to next visit due to time; 5/28/20204: 641 ft with 4WW, pt requests to end test early due to fatigue at 5 min 35 sec; 05/03/23: 645 ft with 4WW and able to complete full test  Goal status: IN PROGRESS  ASSESSMENT:  CLINICAL IMPRESSION: Pt with improved tolerance for majority of exercises today without pain increase in L hip. Exception was with second set of gait/endurance training, which was terminated once pt reported pain increase. Pt also still limited by fatigue throughout. Despite limitations, pt able to complete multiple standing balance exercises without UE support. Pt did provide CGA-min assist with these interventions. The pt will benefit from further skilled PT to improve strength, balance, gait and mobility.  OBJECTIVE IMPAIRMENTS: Abnormal gait, decreased balance, decreased cognition, decreased coordination, decreased endurance, decreased knowledge of use of DME, decreased mobility, difficulty walking, decreased strength, dizziness, hypomobility, impaired UE functional use, improper body mechanics, postural dysfunction, and pain.   ACTIVITY LIMITATIONS: lifting, bending, standing,  squatting, stairs, transfers, bed mobility, bathing, dressing, hygiene/grooming, and locomotion level  PARTICIPATION LIMITATIONS: meal prep, cleaning, laundry, medication management, personal finances, driving, shopping, community activity, and yard work  PERSONAL FACTORS: Age, Fitness, Sex, Time since onset of injury/illness/exacerbation, and 3+ comorbidities:  PMH includes hx of breast cancer, HTN, MCI, insomnia, cataracts, bipolar 1, alcohol use disorder, GAD, MDD, osteopenia of neck of R femur, chronic B low back pain with radiating pain to buttock and posterior thighs and calves. Pt with multiple comorbidities, please refer to chart for full details  are also affecting patient's functional outcome.   REHAB POTENTIAL: Fair    CLINICAL DECISION MAKING: Evolving/moderate complexity  EVALUATION COMPLEXITY: High  PLAN:  PT FREQUENCY: 2x/week  PT DURATION: 12 weeks  PLANNED INTERVENTIONS: Therapeutic exercises, Therapeutic activity, Neuromuscular re-education, Balance training, Gait training, Patient/Family education, Self Care, Joint mobilization, Stair training, Vestibular training, Canalith repositioning, Visual/preceptual remediation/compensation, Orthotic/Fit training, DME instructions, Wheelchair mobility training, Spinal mobilization, Cryotherapy, Moist heat, Splintting, Taping, and Manual therapy  PLAN FOR NEXT SESSION: hamtrings loading, hip strength, mobility, endurance, gait and balance, cardio   4:11 PM, 05/13/23    Baird Kay, PT 05/13/2023, 4:11 PM  Physical Therapist - [facility]  Outpatient Physical Therapy- Main Campus [phone]

## 2023-05-14 DIAGNOSIS — N319 Neuromuscular dysfunction of bladder, unspecified: Secondary | ICD-10-CM | POA: Insufficient documentation

## 2023-05-14 NOTE — Assessment & Plan Note (Signed)
Persistent despite switching from Lisinopril to Losartan. No improvement with nasal spray or reflux medication. Normal chest x-ray. -Consider referral to pulmonology for further evaluation if cough worsens or becomes more frequent.

## 2023-05-14 NOTE — Assessment & Plan Note (Signed)
Reports loss of sensation and control of bladder, likely neurogenic secondary to Parkinson's disease. -Continue timed voids every 30 minutes and wearing a pad. -Consider reducing caffeine intake to minimize bladder irritation.

## 2023-05-14 NOTE — Assessment & Plan Note (Signed)
Well-controlled on Losartan-HCTZ combo pill. -Continue current medication regimen.

## 2023-05-18 ENCOUNTER — Ambulatory Visit: Payer: Medicare PPO

## 2023-05-18 DIAGNOSIS — R278 Other lack of coordination: Secondary | ICD-10-CM

## 2023-05-18 DIAGNOSIS — M6281 Muscle weakness (generalized): Secondary | ICD-10-CM | POA: Diagnosis not present

## 2023-05-18 DIAGNOSIS — M25552 Pain in left hip: Secondary | ICD-10-CM

## 2023-05-18 DIAGNOSIS — R42 Dizziness and giddiness: Secondary | ICD-10-CM | POA: Diagnosis not present

## 2023-05-18 DIAGNOSIS — R2681 Unsteadiness on feet: Secondary | ICD-10-CM | POA: Diagnosis not present

## 2023-05-18 DIAGNOSIS — R262 Difficulty in walking, not elsewhere classified: Secondary | ICD-10-CM | POA: Diagnosis not present

## 2023-05-18 NOTE — Therapy (Signed)
OUTPATIENT NEURO PHYSICAL THERAPY TREATMENT    Patient Name: Kendra [name] MRN: [id number] DOB:1945/11/10, 77 y.o., female Today's Date: 05/18/2023  PCP: Erasmo Downer, MD REFERRING PROVIDER: Erasmo Downer, MD  END OF SESSION:  PT End of Session - 05/18/23 1619     Visit Number 53    Number of Visits 60    Date for PT Re-Evaluation 06/08/23    Authorization Type [employer] PPO    Authorization Time Period 11/04/2022-12/30/2022    Progress Note Due on Visit 30    PT Start Time 1150    PT Stop Time 1230    PT Time Calculation (min) 40 min    Equipment Utilized During Treatment Gait belt    Activity Tolerance Patient tolerated treatment well;Patient limited by fatigue    Behavior During Therapy WFL for tasks assessed/performed                          Past Medical History:  Diagnosis Date   Anxiety    Breast cancer (HCC) 03/2018   right breast cancer at 11:00 and 1:00   Bursitis    bilateral hips and knees   Complication of anesthesia    Depression    Family history of breast cancer 03/24/2018   Family history of lung cancer 03/24/2018   History of alcohol dependence (HCC) 2007   no ETOH; resolved since 2007   History of hiatal hernia    History of psychosis 2014   due to sleep disturbance   Hypertension    Parkinson disease    Personal history of radiation therapy 06/2018-07/2018   right breast ca   PONV (postoperative nausea and vomiting)    Past Surgical History:  Procedure Laterality Date   BREAST BIOPSY Right 03/14/2018   11:00 DCIS and invasive ductal carcinoma   BREAST BIOPSY Right 03/14/2018   1:00 Invasive ductal carcinoma   BREAST LUMPECTOMY Right 04/27/2018   lumpectomy of 11 and 1:00 cancers, clear margins, negative LN   BREAST LUMPECTOMY WITH RADIOACTIVE SEED AND SENTINEL LYMPH NODE BIOPSY Right 04/27/2018   Procedure: RIGHT BREAST LUMPECTOMY WITH RADIOACTIVE SEED X 2 AND RIGHT SENTINEL LYMPH NODE BIOPSY;   Surgeon: Glenna Fellows, MD;  Location: [facility];  Service: General;  Laterality: Right;   CATARACT EXTRACTION W/PHACO Left 08/30/2019   Procedure: CATARACT EXTRACTION PHACO AND INTRAOCULAR LENS PLACEMENT (IOC) LEFT panoptix toric  01:20.5  12.7%  10.26;  Surgeon: Lockie Mola, MD;  Location: [facility];  Service: Ophthalmology;  Laterality: Left;   CATARACT EXTRACTION W/PHACO Right 09/20/2019   Procedure: CATARACT EXTRACTION PHACO AND INTRAOCULAR LENS PLACEMENT (IOC) RIGHT 5.71  00:36.4  15.7%;  Surgeon: Lockie Mola, MD;  Location: [facility];  Service: Ophthalmology;  Laterality: Right;   LAPAROTOMY N/A 02/10/2018   Procedure: EXPLORATORY LAPAROTOMY;  Surgeon: Shonna Chock, MD;  Location: [facility];  Service: Gynecology;  Laterality: N/A;   MASS EXCISION  01/2018   abdominal   OVARIAN CYST REMOVAL     SALPINGOOPHORECTOMY Bilateral 02/10/2018   Procedure: BILATERAL SALPINGO OOPHORECTOMY; PERITONEAL WASHINGS;  Surgeon: Shonna Chock, MD;  Location: [facility];  Service: Gynecology;  Laterality: Bilateral;   TONSILLECTOMY     TONSILLECTOMY AND ADENOIDECTOMY  1953   Patient Active Problem List   Diagnosis Date Noted   Neurogenic bladder 05/14/2023   Prediabetes 12/15/2022   Chronic cough 08/17/2022   History of allergy to shellfish 02/24/2022   Chronic  low back pain of over 3 months duration 02/04/2022   Osteoarthritis of  AC (acromioclavicular) joint (Left) 02/04/2022   Osteoarthritis of hip (Right) 02/04/2022   Impaction fracture of distal end of radius and ulna, sequela (Left) 02/04/2022   Osteoarthritis of 1st (thumb) carpometacarpal (CMC) joint of hand (Left) 02/04/2022   Lumbosacral facet arthropathy (Multilevel) (Bilateral) (T12-S1) 02/04/2022   Lumbar central spinal stenosis w/o neurogenic claudication (L2-3, L3-4, L4-5) 02/04/2022   Lumbar foraminal stenosis (Bilateral: L2-3, L4-5) (Right: (Severe) L4-5) 02/04/2022   Lumbar  lateral recess stenosis (Bilateral: L2-3, L3-4, and L4-5) (Severe) 02/04/2022   DDD (degenerative disc disease), lumbar 02/04/2022   Lumbosacral facet syndrome (Multilevel) (Bilateral) 02/04/2022   Osteoarthritis of lumbar spine 02/04/2022   Allergic conjunctivitis of both eyes 12/23/2021   Chronic hip pain (3ry area of Pain) (Bilateral) (L>R) 12/22/2021   Chronic lower extremity pain (2ry area of Pain) (Bilateral) (L>R) 12/22/2021   Abnormal MRI, lumbar spine (01/31/2020) 12/22/2021   Grade 1 Anterolisthesis of lumbar spine (L3/L4) & (5 mm) (L4/L5) 12/22/2021   Chronic pain syndrome 11/17/2021   High risk medication use 11/17/2021   Disorder of skeletal system 11/17/2021   Problems influencing health status 11/17/2021   Other hyperlipidemia 10/21/2021   Polypharmacy 08/12/2021   Chronic low back pain (1ry area of Pain) (Bilateral) (L>R) w/o sciatica 06/18/2021   Osteopenia of neck of femur (Right) 09/06/2020   Obesity 08/29/2020   Parkinson disease 02/22/2020   Mucinous cystadenoma 12/24/2019   Bipolar disorder, in full remission, most recent episode depressed (HCC) 11/08/2019   Vitamin D insufficiency 08/24/2019   Insomnia due to mental condition 05/31/2019   Bipolar I disorder, most recent episode depressed (HCC) 05/31/2019   Alcohol use disorder, moderate, in sustained remission (HCC) 05/31/2019   Elevated tumor markers 05/27/2019   GAD (generalized anxiety disorder) 04/10/2019   MDD (major depressive disorder), severe (HCC) 01/31/2019   High serum carbohydrate antigen 19-9 (CA19-9) 12/01/2018   Adjustment disorder with anxious mood 11/24/2018   Essential hypertension 11/24/2018   B12 deficiency 10/17/2018   Incisional hernia, without obstruction or gangrene 09/12/2018   Genetic testing 04/07/2018   Family history of breast cancer 03/24/2018   Family history of lung cancer 03/24/2018   Malignant neoplasm of upper-outer quadrant of right breast in female, estrogen receptor  positive (HCC) 03/17/2018   Malignant neoplasm of upper-inner quadrant of right breast in female, estrogen receptor positive (HCC) 03/17/2018   History of psychosis 02/07/2018   History of insomnia 02/07/2018    ONSET DATE: Pt diagnosed with PD in 2019  REFERRING DIAG:  G20 (ICD-10-CM) - Parkinson's disease      THERAPY DIAG:  Muscle weakness (generalized)  Difficulty in walking, not elsewhere classified  Pain in left hip  Unsteadiness on feet  Other lack of coordination  Rationale for Evaluation and Treatment: Rehabilitation  SUBJECTIVE: Pt with continued L hip pain. She reports recent appointment with her primary care doc and that she is being referred to a pulmonologist.  Pain: L hip pain Pt accompanied by: significant other self, brought to session by caregiver  PERTINENT HISTORY:  Pt is a pleasant 77 yo female who was diagnosed with PD in 2019. She is known to clinic and [facility], and was last seen for LSVT BIG Sept-Oct 2022. Pt with chronic pain and has become increasingly sedentary as a result exacerbating mobility difficulties due to PD. Current concerns: Pt reports L and R hip pain and back pain. Pt was being seen by pain management  clinic. Pt has had injections that provided relief at the time. Pt's spouse reports her physician determined surgery was not an option for her pain so the plan is to manage it. Pt spouse thinks pain has resulted in reluctance for pt to move. Pt currently sedentary, doesn't walk much, but when she does she holds on to furniture for support due to balance problems. When pt first stands she must hold on to something due to mix of pain and/or dizziness (described as lightheadedness). Her pain can go from 3/10 to 9/10. It does improve once she starts moving. Pt has increased difficulty with multiple activities including: getting in and out of her bed, getting in and out of her car, freezing, decreased gait speed, decreased balance and cognition, and  difficulty using BUE due to tremors.  Per chart other PMH includes hx of breast cancer, HTN, MCI, insomnia, cataracts, bipolar 1, alcohol use disorder, GAD, MDD, osteopenia of neck of R femur, chronic B low back pain with radiating pain to buttock and posterior thighs and calves. Pt with multiple comorbidities, please refer to chart for full details    PAIN: from eval Are you having pain? Yes: NPRS scale: not rated currently but can reach 9/10 Pain location: L hip and L leg Pain description: pt has difficulty describing Aggravating factors: unsure Relieving factors: laying in the recliner helps, movement  PRECAUTIONS: Fall  WEIGHT BEARING RESTRICTIONS: No  FALLS: Has patient fallen in last 6 months? Yes. Number of falls 1x, leg tangled in chair leg when getting up from a chair  PLOF: Independent with basic ADLs  PATIENT GOALS: improve mobility   BLE Strength Assessment      Right  Left  Hip Flexion 5/5 4/5 (painful)  Hip Horizontal ABDCT  5/5 5/5  Hip Horizontal ADD 4+/5 4+/5  Hip IR (seated)  4+/5 4+/5  Hip ER (seated) 5/5 5/5  Knee Extension 5/5 5/5  Knee Flexion (seated) 4+/5  5/5 (not painful)  Hip ABDCT (supine)  3+/5 4-/5   Focal popliteal edema Left knee, not painful   ROM Assessment Hip     Right  Left   Flexion Supine 132 120 (ouch)  External rotation (90/90) 30 47  Internal Rotation (90/90) 40  20  Supine Hip Extension P/ROM >0 degrees >0 degrees       IMAGING: via chart   07/24/22 DG BONE density " ASSESSMENT: The probability of a major osteoporotic fracture is 15.8% within the next ten years.   The probability of a hip fracture is 2.9% within the next ten years."   12/22/21 DG lumbar spine " IMPRESSION: 1. No acute displaced fracture or traumatic listhesis of the lumbar spine. 2. Interval development of grade 1 anterolisthesis of L3 on L4. Stable grade 1 anterolisthesis of L4 on L5. Appears stable on flexion extension views. 3.  Aortic  Atherosclerosis (ICD10-I70.0)."   New from chart:  DG Lumbar spine 01/05/23: "IMPRESSION: 1. Lumbar spine scoliosis concave right. Diffuse multilevel degenerative change. Disc degeneration most prominent at L2-L3 and L5-S1. 5 mm anterolisthesis L4 on L5. Diffuse severe facet hypertrophy. No flexion or extension abnormality identified. No evidence of fracture. 2. Sclerotic densities are noted in the L2 and L4 vertebral bodies. Although these could represent bone islands blastic metastatic disease cannot be excluded. MRI of the lumbar spine suggested for further evaluation. 3. Large amount of stool noted throughout the colon. 4. Aortoiliac atherosclerotic vascular disease."    DG hip 01/05/23: "IMPRESSION: Moderate bilateral femoroacetabular osteoarthritis, similar  to prior."   PATIENT EDUCATION:  Education details: Pt educated throughout session about proper posture and technique with exercises. Improved exercise technique, movement at target joints, use of target muscles after min to mod verbal, visual, tactile cues.  Person educated: Patient and Spouse Education method: Explanation, Demonstration, and Verbal cues Education comprehension: verbalized understanding, returned demonstration, verbal cues required, tactile cues required, and needs further education   TODAY'S TREATMENT:                                                                                                                                         DATE: 05/18/23   Gait belt donned and CGA provided throughout unless specified otherwise  THEREX:    Circuit 1:  Ambulation for endurance with 4WW and 5# AW 1 x 296 ft. Pt requests to sit down, reports dizziness (chronic impairment).  Brief rest break STS with large amplitude BUE shoulder abduction 10x   Circuit 2: Ambulation for endurance with 4WW and 5# AW 1 x 296 ft.  Seated LAQ 15x each LE  STS with large amplitude BUE shoulder abduction 10x  Seated forward  bend with backwards step and backwards reach 10x each side with UUE support    Circuit 3:  Ambulation for endurance with 4WW and 5# AW 1 x 296 ft. Pt requests to sit down, stating she's feeling "tired, just really tired."  HR: 92 bpm, SpO2% 97%  STS with large amplitude BUE shoulder abduction 10x  STS 12x  Pt asks for multiple rest breaks throughout due to fatigue.   NMR: Standing on airex, WBOS, with finger-touch support on surface 4 x 15-30 sec rounds, cuing throughout for pt to attend to task     HOME EXERCISE PROGRAM: pt to continue HEP as previously given  Access Code: [email or web address] URL: [email or web address] Date: 12/22/2022 Prepared by: Temple Pacini  Exercises - Sit to Stand with Counter Support  - 1 x daily - 5 x weekly - 3 sets - 10 reps  GOALS: Goals reviewed with patient? Yes  SHORT TERM GOALS: Target date: 04/27/2023    Patient will be independent in home exercise program to improve strength/mobility for better functional independence with ADLs. Baseline: LSVT BIG protocol to be initiated next session; 12/18: not yet fully indep, reports she still gets confused about the technique; 12/03/2022: HEP to be updated; Pt reports practicing HEP 2x/week; 02/23/23: not currently performing HEP, reviewed in session and provided new copy; 03/16/2023: pt reports performing HEP every day; 03/30/23: Pt reports she is not being consistent with her HEP; 7/1: pt reports she is not doing much at home Goal status: MET (previously MET)   LONG TERM GOALS: Target date: 06/08/2023     Patient will increase FOTO score to equal to or greater than 49 to demonstrate statistically significant improvement in mobility and quality of life.  Baseline: 40; 12/18: deferred due to time; 45;  3/19: deferred to next visit; 01/21/2023; 02/23/2023: 46 unchanged from previous assessment); 03/17/2023: 48 Goal status: PARTIALLY MET  2.  Patient (> 76 years old) will complete five times sit to stand  test in < 15 seconds indicating decreased fall risk.  Baseline: 27 sec; 12/18 23 sec hands-free; 11/04/22: 24 sec hands free; 2/1/124: 22.7 sec use of BUE, 21.3 sec hands-free; 01/19/23: 18 seconds hands-free; 02/23/23: 19 seconds hands-free; 03/16/2023: 18 seconds hands-free; 03/30/23: 17.3 seconds hands-free; 05/03/23: 17.3 seconds hands-free  Goal status: IN PROGRESS  3.  Patient will increase 10 meter walk test to >1.8m/s with WFL B step-length, upright posture for >90% of test to improve ease with gait and to override hypokentic and bradykinetic movement.  Baseline: 0.58 m/s with 2NF; 12/18: 0.7 m/s with 6OZ; 11/04/22: 0.69 m/s with 3YQ;  12/03/22: 0.73 m/s with 6VH; 01/19/2023: 0.77 m/s with 8IO; 02/23/2023: 0.83 m/s with 9GE; 03/16/2023: 0.83 m/s with 9BM , pt still somewhat crouched, requires cues for upright posture >50% of time; 03/30/23: 0.83 m/s with crouched posture; 05/03/23 0.8 m/s with RW, some improvement with upright posture  Goal status: IN PROGRESS  4.  Patient will reduce time to don jacket by at least 5 seconds in order to increase ease with dressing and override bradykinetic movement Baseline: 17 seconds; 12/18: 12 seconds Goal status: MET  5.  Pt will demo or report ability to open standard jar using only 1-2 attempts in order to exhibit improved functional mobility for ADLs.  Baseline: 5.7 seconds with multiple attempts to turn jar lid to open; 12/18: 2.8 sec opens in 2 turns Goal status: MET  6.  Patient will increase Berg Balance score by > 6 points to demonstrate decreased fall risk during functional activities. Baseline: to be tested next 1-2 visits; 12/03/2022: 44/56; 01/21/23 45/56; 48/56; 03/17/2023: 48/56; 05/03/23: 50/56 Goal status: Partially met   7.  Patient will report at least 4 days in a row with minimal to no L hip pain in order to improve QOL and ease/safety with functional mobility. Baseline: Pt reports pain majority of days in L hip; 12/03/2022: pt reports having hip pain about  every day; 3/19: rates hip pain daily (being seen now by MD, new imaging); 02/23/2023: pt went for period of time without hip pain (she thinks a week), but reports it has now come back,; 5/28: pt reports hip pain "every other day"; 05/03/23: pt reports pain in hip every day in past week Goal status: NOT MET/discontinued   8. Patient will increase six minute walk test distance to >1000 for progression to community ambulator and improve gait ability Baseline:02/23/2023: 722 ft with 4WW and two standing rest breaks, rates test as hard; 03/16/2023: deferred to next visit due to time; 5/28/20204: 641 ft with 4WW, pt requests to end test early due to fatigue at 5 min 35 sec; 05/03/23: 645 ft with 4WW and able to complete full test  Goal status: IN PROGRESS  ASSESSMENT:  CLINICAL IMPRESSION: Pt quick to ask for rest break throughout due to increased fatigue and at times dizziness. SpO2% WNL and HR WNL for activity. Pt reported difficulty at beginning of appointment due to earlier appointment time, is trying to get later appointment slots. Will continue to monitor pt's level of fatigue to determine if pt experiencing regression. The pt will benefit from further skilled PT to improve strength, balance, gait and mobility.  OBJECTIVE IMPAIRMENTS: Abnormal gait, decreased balance, decreased cognition, decreased coordination, decreased endurance, decreased knowledge of use of  DME, decreased mobility, difficulty walking, decreased strength, dizziness, hypomobility, impaired UE functional use, improper body mechanics, postural dysfunction, and pain.   ACTIVITY LIMITATIONS: lifting, bending, standing, squatting, stairs, transfers, bed mobility, bathing, dressing, hygiene/grooming, and locomotion level  PARTICIPATION LIMITATIONS: meal prep, cleaning, laundry, medication management, personal finances, driving, shopping, community activity, and yard work  PERSONAL FACTORS: Age, Fitness, Sex, Time since onset of  injury/illness/exacerbation, and 3+ comorbidities: PMH includes hx of breast cancer, HTN, MCI, insomnia, cataracts, bipolar 1, alcohol use disorder, GAD, MDD, osteopenia of neck of R femur, chronic B low back pain with radiating pain to buttock and posterior thighs and calves. Pt with multiple comorbidities, please refer to chart for full details  are also affecting patient's functional outcome.   REHAB POTENTIAL: Fair    CLINICAL DECISION MAKING: Evolving/moderate complexity  EVALUATION COMPLEXITY: High  PLAN:  PT FREQUENCY: 2x/week  PT DURATION: 12 weeks  PLANNED INTERVENTIONS: Therapeutic exercises, Therapeutic activity, Neuromuscular re-education, Balance training, Gait training, Patient/Family education, Self Care, Joint mobilization, Stair training, Vestibular training, Canalith repositioning, Visual/preceptual remediation/compensation, Orthotic/Fit training, DME instructions, Wheelchair mobility training, Spinal mobilization, Cryotherapy, Moist heat, Splintting, Taping, and Manual therapy  PLAN FOR NEXT SESSION: hamtrings loading, hip strength, mobility, endurance, gait and balance, cardio   4:26 PM, 05/18/23    Baird Kay, PT 05/18/2023, 4:26 PM  Physical Therapist - [facility]  Outpatient Physical Therapy- Main Campus [phone]

## 2023-05-20 ENCOUNTER — Ambulatory Visit: Payer: Medicare PPO

## 2023-05-20 DIAGNOSIS — R278 Other lack of coordination: Secondary | ICD-10-CM

## 2023-05-20 DIAGNOSIS — R262 Difficulty in walking, not elsewhere classified: Secondary | ICD-10-CM | POA: Diagnosis not present

## 2023-05-20 DIAGNOSIS — M25552 Pain in left hip: Secondary | ICD-10-CM

## 2023-05-20 DIAGNOSIS — R42 Dizziness and giddiness: Secondary | ICD-10-CM | POA: Diagnosis not present

## 2023-05-20 DIAGNOSIS — R2681 Unsteadiness on feet: Secondary | ICD-10-CM | POA: Diagnosis not present

## 2023-05-20 DIAGNOSIS — M6281 Muscle weakness (generalized): Secondary | ICD-10-CM

## 2023-05-20 NOTE — Therapy (Signed)
OUTPATIENT NEURO PHYSICAL THERAPY TREATMENT    Patient Name: Kendra [name] MRN: [id number] DOB:Jun 05, 1946, 77 y.o., female Today's Date: 05/20/2023  PCP: Erasmo Downer, MD REFERRING PROVIDER: Erasmo Downer, MD  END OF SESSION:  PT End of Session - 05/20/23 1945     Visit Number 54    Number of Visits 60    Date for PT Re-Evaluation 06/08/23    Authorization Type [employer] PPO    Authorization Time Period 11/04/2022-12/30/2022    Progress Note Due on Visit 30    PT Start Time 1322    PT Stop Time 1400    PT Time Calculation (min) 38 min    Equipment Utilized During Treatment Gait belt    Activity Tolerance Patient tolerated treatment well;Patient limited by fatigue    Behavior During Therapy WFL for tasks assessed/performed                           Past Medical History:  Diagnosis Date   Anxiety    Breast cancer (HCC) 03/2018   right breast cancer at 11:00 and 1:00   Bursitis    bilateral hips and knees   Complication of anesthesia    Depression    Family history of breast cancer 03/24/2018   Family history of lung cancer 03/24/2018   History of alcohol dependence (HCC) 2007   no ETOH; resolved since 2007   History of hiatal hernia    History of psychosis 2014   due to sleep disturbance   Hypertension    Parkinson disease    Personal history of radiation therapy 06/2018-07/2018   right breast ca   PONV (postoperative nausea and vomiting)    Past Surgical History:  Procedure Laterality Date   BREAST BIOPSY Right 03/14/2018   11:00 DCIS and invasive ductal carcinoma   BREAST BIOPSY Right 03/14/2018   1:00 Invasive ductal carcinoma   BREAST LUMPECTOMY Right 04/27/2018   lumpectomy of 11 and 1:00 cancers, clear margins, negative LN   BREAST LUMPECTOMY WITH RADIOACTIVE SEED AND SENTINEL LYMPH NODE BIOPSY Right 04/27/2018   Procedure: RIGHT BREAST LUMPECTOMY WITH RADIOACTIVE SEED X 2 AND RIGHT SENTINEL LYMPH NODE BIOPSY;   Surgeon: Glenna Fellows, MD;  Location:  SURGERY CENTER;  Service: General;  Laterality: Right;   CATARACT EXTRACTION W/PHACO Left 08/30/2019   Procedure: CATARACT EXTRACTION PHACO AND INTRAOCULAR LENS PLACEMENT (IOC) LEFT panoptix toric  01:20.5  12.7%  10.26;  Surgeon: Lockie Mola, MD;  Location: [facility];  Service: Ophthalmology;  Laterality: Left;   CATARACT EXTRACTION W/PHACO Right 09/20/2019   Procedure: CATARACT EXTRACTION PHACO AND INTRAOCULAR LENS PLACEMENT (IOC) RIGHT 5.71  00:36.4  15.7%;  Surgeon: Lockie Mola, MD;  Location: [facility];  Service: Ophthalmology;  Laterality: Right;   LAPAROTOMY N/A 02/10/2018   Procedure: EXPLORATORY LAPAROTOMY;  Surgeon: Shonna Chock, MD;  Location: [facility];  Service: Gynecology;  Laterality: N/A;   MASS EXCISION  01/2018   abdominal   OVARIAN CYST REMOVAL     SALPINGOOPHORECTOMY Bilateral 02/10/2018   Procedure: BILATERAL SALPINGO OOPHORECTOMY; PERITONEAL WASHINGS;  Surgeon: Shonna Chock, MD;  Location: [facility];  Service: Gynecology;  Laterality: Bilateral;   TONSILLECTOMY     TONSILLECTOMY AND ADENOIDECTOMY  1953   Patient Active Problem List   Diagnosis Date Noted   Neurogenic bladder 05/14/2023   Prediabetes 12/15/2022   Chronic cough 08/17/2022   History of allergy to shellfish 02/24/2022  Chronic low back pain of over 3 months duration 02/04/2022   Osteoarthritis of  AC (acromioclavicular) joint (Left) 02/04/2022   Osteoarthritis of hip (Right) 02/04/2022   Impaction fracture of distal end of radius and ulna, sequela (Left) 02/04/2022   Osteoarthritis of 1st (thumb) carpometacarpal (CMC) joint of hand (Left) 02/04/2022   Lumbosacral facet arthropathy (Multilevel) (Bilateral) (T12-S1) 02/04/2022   Lumbar central spinal stenosis w/o neurogenic claudication (L2-3, L3-4, L4-5) 02/04/2022   Lumbar foraminal stenosis (Bilateral: L2-3, L4-5) (Right: (Severe) L4-5) 02/04/2022   Lumbar  lateral recess stenosis (Bilateral: L2-3, L3-4, and L4-5) (Severe) 02/04/2022   DDD (degenerative disc disease), lumbar 02/04/2022   Lumbosacral facet syndrome (Multilevel) (Bilateral) 02/04/2022   Osteoarthritis of lumbar spine 02/04/2022   Allergic conjunctivitis of both eyes 12/23/2021   Chronic hip pain (3ry area of Pain) (Bilateral) (L>R) 12/22/2021   Chronic lower extremity pain (2ry area of Pain) (Bilateral) (L>R) 12/22/2021   Abnormal MRI, lumbar spine (01/31/2020) 12/22/2021   Grade 1 Anterolisthesis of lumbar spine (L3/L4) & (5 mm) (L4/L5) 12/22/2021   Chronic pain syndrome 11/17/2021   High risk medication use 11/17/2021   Disorder of skeletal system 11/17/2021   Problems influencing health status 11/17/2021   Other hyperlipidemia 10/21/2021   Polypharmacy 08/12/2021   Chronic low back pain (1ry area of Pain) (Bilateral) (L>R) w/o sciatica 06/18/2021   Osteopenia of neck of femur (Right) 09/06/2020   Obesity 08/29/2020   Parkinson disease 02/22/2020   Mucinous cystadenoma 12/24/2019   Bipolar disorder, in full remission, most recent episode depressed (HCC) 11/08/2019   Vitamin D insufficiency 08/24/2019   Insomnia due to mental condition 05/31/2019   Bipolar I disorder, most recent episode depressed (HCC) 05/31/2019   Alcohol use disorder, moderate, in sustained remission (HCC) 05/31/2019   Elevated tumor markers 05/27/2019   GAD (generalized anxiety disorder) 04/10/2019   MDD (major depressive disorder), severe (HCC) 01/31/2019   High serum carbohydrate antigen 19-9 (CA19-9) 12/01/2018   Adjustment disorder with anxious mood 11/24/2018   Essential hypertension 11/24/2018   B12 deficiency 10/17/2018   Incisional hernia, without obstruction or gangrene 09/12/2018   Genetic testing 04/07/2018   Family history of breast cancer 03/24/2018   Family history of lung cancer 03/24/2018   Malignant neoplasm of upper-outer quadrant of right breast in female, estrogen receptor  positive (HCC) 03/17/2018   Malignant neoplasm of upper-inner quadrant of right breast in female, estrogen receptor positive (HCC) 03/17/2018   History of psychosis 02/07/2018   History of insomnia 02/07/2018    ONSET DATE: Pt diagnosed with PD in 2019  REFERRING DIAG:  G20 (ICD-10-CM) - Parkinson's disease      THERAPY DIAG:  Muscle weakness (generalized)  Difficulty in walking, not elsewhere classified  Pain in left hip  Unsteadiness on feet  Other lack of coordination  Rationale for Evaluation and Treatment: Rehabilitation  SUBJECTIVE: Pt reports moderate L hip pain. She reports she is late to session due to confusion about time it takes to get to clinic from her home. No other updates/concerns.  Pain: L hip pain Pt accompanied by: significant other self, brought to session by caregiver  PERTINENT HISTORY:  Pt is a pleasant 77 yo female who was diagnosed with PD in 2019. She is known to clinic and [facility], and was last seen for LSVT BIG Sept-Oct 2022. Pt with chronic pain and has become increasingly sedentary as a result exacerbating mobility difficulties due to PD. Current concerns: Pt reports L and R hip pain and back pain.  Pt was being seen by pain management clinic. Pt has had injections that provided relief at the time. Pt's spouse reports her physician determined surgery was not an option for her pain so the plan is to manage it. Pt spouse thinks pain has resulted in reluctance for pt to move. Pt currently sedentary, doesn't walk much, but when she does she holds on to furniture for support due to balance problems. When pt first stands she must hold on to something due to mix of pain and/or dizziness (described as lightheadedness). Her pain can go from 3/10 to 9/10. It does improve once she starts moving. Pt has increased difficulty with multiple activities including: getting in and out of her bed, getting in and out of her car, freezing, decreased gait speed, decreased  balance and cognition, and difficulty using BUE due to tremors.  Per chart other PMH includes hx of breast cancer, HTN, MCI, insomnia, cataracts, bipolar 1, alcohol use disorder, GAD, MDD, osteopenia of neck of R femur, chronic B low back pain with radiating pain to buttock and posterior thighs and calves. Pt with multiple comorbidities, please refer to chart for full details    PAIN: from eval Are you having pain? Yes: NPRS scale: not rated currently but can reach 9/10 Pain location: L hip and L leg Pain description: pt has difficulty describing Aggravating factors: unsure Relieving factors: laying in the recliner helps, movement  PRECAUTIONS: Fall  WEIGHT BEARING RESTRICTIONS: No  FALLS: Has patient fallen in last 6 months? Yes. Number of falls 1x, leg tangled in chair leg when getting up from a chair  PLOF: Independent with basic ADLs  PATIENT GOALS: improve mobility   BLE Strength Assessment      Right  Left  Hip Flexion 5/5 4/5 (painful)  Hip Horizontal ABDCT  5/5 5/5  Hip Horizontal ADD 4+/5 4+/5  Hip IR (seated)  4+/5 4+/5  Hip ER (seated) 5/5 5/5  Knee Extension 5/5 5/5  Knee Flexion (seated) 4+/5  5/5 (not painful)  Hip ABDCT (supine)  3+/5 4-/5   Focal popliteal edema Left knee, not painful   ROM Assessment Hip     Right  Left   Flexion Supine 132 120 (ouch)  External rotation (90/90) 30 47  Internal Rotation (90/90) 40  20  Supine Hip Extension P/ROM >0 degrees >0 degrees       IMAGING: via chart   07/24/22 DG BONE density " ASSESSMENT: The probability of a major osteoporotic fracture is 15.8% within the next ten years.   The probability of a hip fracture is 2.9% within the next ten years."   12/22/21 DG lumbar spine " IMPRESSION: 1. No acute displaced fracture or traumatic listhesis of the lumbar spine. 2. Interval development of grade 1 anterolisthesis of L3 on L4. Stable grade 1 anterolisthesis of L4 on L5. Appears stable on flexion extension  views. 3.  Aortic Atherosclerosis (ICD10-I70.0)."   New from chart:  DG Lumbar spine 01/05/23: "IMPRESSION: 1. Lumbar spine scoliosis concave right. Diffuse multilevel degenerative change. Disc degeneration most prominent at L2-L3 and L5-S1. 5 mm anterolisthesis L4 on L5. Diffuse severe facet hypertrophy. No flexion or extension abnormality identified. No evidence of fracture. 2. Sclerotic densities are noted in the L2 and L4 vertebral bodies. Although these could represent bone islands blastic metastatic disease cannot be excluded. MRI of the lumbar spine suggested for further evaluation. 3. Large amount of stool noted throughout the colon. 4. Aortoiliac atherosclerotic vascular disease."    DG hip  01/05/23: "IMPRESSION: Moderate bilateral femoroacetabular osteoarthritis, similar to prior."   PATIENT EDUCATION:  Education details: Pt educated throughout session about proper posture and technique with exercises. Improved exercise technique, movement at target joints, use of target muscles after min to mod verbal, visual, tactile cues.  Person educated: Patient and Spouse Education method: Explanation, Demonstration, and Verbal cues Education comprehension: verbalized understanding, returned demonstration, verbal cues required, tactile cues required, and needs further education   TODAY'S TREATMENT:                                                                                                                                         DATE: 05/20/23   Gait belt donned and CGA provided throughout unless specified otherwise  THEREX:   Circuit 1:  Ambulation for endurance with 4WW and 5# AW 1 x 592 ft.  Brief rest break STS with 2# bar overhead press 8x Seated march 10x each LE with 5# AW each LE  Circuit 2: Ambulation for endurance with 4WW and 5# AW 1 x 296 ft.  STS with 2# bar overhead press 8x Standing forward bend with backwards step and backwards reach 10x each side with  UUE support*   *this intervention is part of the above circuit, but is a NMR intervention  TA:  On large mat table: instruction in bed mobility with focus on rolling techniques/using momentum/LE and UE positioning. Pt practiced going to R and L sides. More difficulty rolling to L than R x 10 min  NMR: Seated forward reach>forward bend>bilateral shoulder abduction in upright with 5 second hold 10x    HOME EXERCISE PROGRAM: pt to continue HEP as previously given  Access Code: [email or web address] URL: [email or web address] Date: 12/22/2022 Prepared by: Temple Pacini  Exercises - Sit to Stand with Counter Support  - 1 x daily - 5 x weekly - 3 sets - 10 reps  GOALS: Goals reviewed with patient? Yes  SHORT TERM GOALS: Target date: 04/27/2023    Patient will be independent in home exercise program to improve strength/mobility for better functional independence with ADLs. Baseline: LSVT BIG protocol to be initiated next session; 12/18: not yet fully indep, reports she still gets confused about the technique; 12/03/2022: HEP to be updated; Pt reports practicing HEP 2x/week; 02/23/23: not currently performing HEP, reviewed in session and provided new copy; 03/16/2023: pt reports performing HEP every day; 03/30/23: Pt reports she is not being consistent with her HEP; 7/1: pt reports she is not doing much at home Goal status: MET (previously MET)   LONG TERM GOALS: Target date: 06/08/2023     Patient will increase FOTO score to equal to or greater than 49 to demonstrate statistically significant improvement in mobility and quality of life.  Baseline: 40; 12/18: deferred due to time; 45; 3/19: deferred to next visit; 01/21/2023; 02/23/2023: 46 unchanged from previous assessment); 03/17/2023: 48 Goal status: PARTIALLY MET  2.  Patient (> 65 years old) will complete five times sit to stand test in < 15 seconds indicating decreased fall risk.  Baseline: 27 sec; 12/18 23 sec hands-free; 11/04/22: 24  sec hands free; 2/1/124: 22.7 sec use of BUE, 21.3 sec hands-free; 01/19/23: 18 seconds hands-free; 02/23/23: 19 seconds hands-free; 03/16/2023: 18 seconds hands-free; 03/30/23: 17.3 seconds hands-free; 05/03/23: 17.3 seconds hands-free  Goal status: IN PROGRESS  3.  Patient will increase 10 meter walk test to >1.38m/s with WFL B step-length, upright posture for >90% of test to improve ease with gait and to override hypokentic and bradykinetic movement.  Baseline: 0.58 m/s with 0NU; 12/18: 0.7 m/s with 2VO; 11/04/22: 0.69 m/s with 5DG;  12/03/22: 0.73 m/s with 6YQ; 01/19/2023: 0.77 m/s with 0HK; 02/23/2023: 0.83 m/s with 7QQ; 03/16/2023: 0.83 m/s with 5ZD , pt still somewhat crouched, requires cues for upright posture >50% of time; 03/30/23: 0.83 m/s with crouched posture; 05/03/23 0.8 m/s with RW, some improvement with upright posture  Goal status: IN PROGRESS  4.  Patient will reduce time to don jacket by at least 5 seconds in order to increase ease with dressing and override bradykinetic movement Baseline: 17 seconds; 12/18: 12 seconds Goal status: MET  5.  Pt will demo or report ability to open standard jar using only 1-2 attempts in order to exhibit improved functional mobility for ADLs.  Baseline: 5.7 seconds with multiple attempts to turn jar lid to open; 12/18: 2.8 sec opens in 2 turns Goal status: MET  6.  Patient will increase Berg Balance score by > 6 points to demonstrate decreased fall risk during functional activities. Baseline: to be tested next 1-2 visits; 12/03/2022: 44/56; 01/21/23 45/56; 48/56; 03/17/2023: 48/56; 05/03/23: 50/56 Goal status: Partially met   7.  Patient will report at least 4 days in a row with minimal to no L hip pain in order to improve QOL and ease/safety with functional mobility. Baseline: Pt reports pain majority of days in L hip; 12/03/2022: pt reports having hip pain about every day; 3/19: rates hip pain daily (being seen now by MD, new imaging); 02/23/2023: pt went for period of  time without hip pain (she thinks a week), but reports it has now come back,; 5/28: pt reports hip pain "every other day"; 05/03/23: pt reports pain in hip every day in past week Goal status: NOT MET/discontinued   8. Patient will increase six minute walk test distance to >1000 for progression to community ambulator and improve gait ability Baseline:02/23/2023: 722 ft with 4WW and two standing rest breaks, rates test as hard; 03/16/2023: deferred to next visit due to time; 5/28/20204: 641 ft with 4WW, pt requests to end test early due to fatigue at 5 min 35 sec; 05/03/23: 645 ft with 4WW and able to complete full test  Goal status: IN PROGRESS  ASSESSMENT:  CLINICAL IMPRESSION: Pt with improved activity tolerance today, but does require frequent encouragement from PT to complete interventions. Pt performance with TE circuit suggests that pt can progress these activities next visit. PT also spent time reviewing bed mobility technique with pt as she reported some difficulty getting in and out of her bed at home during appointment. Pt found to have more difficulty rolling to L side. The pt will benefit from further skilled PT to improve strength, balance, gait and mobility.  OBJECTIVE IMPAIRMENTS: Abnormal gait, decreased balance, decreased cognition, decreased coordination, decreased endurance, decreased knowledge of use of DME, decreased mobility, difficulty walking, decreased strength, dizziness, hypomobility, impaired UE  functional use, improper body mechanics, postural dysfunction, and pain.   ACTIVITY LIMITATIONS: lifting, bending, standing, squatting, stairs, transfers, bed mobility, bathing, dressing, hygiene/grooming, and locomotion level  PARTICIPATION LIMITATIONS: meal prep, cleaning, laundry, medication management, personal finances, driving, shopping, community activity, and yard work  PERSONAL FACTORS: Age, Fitness, Sex, Time since onset of injury/illness/exacerbation, and 3+ comorbidities:  PMH includes hx of breast cancer, HTN, MCI, insomnia, cataracts, bipolar 1, alcohol use disorder, GAD, MDD, osteopenia of neck of R femur, chronic B low back pain with radiating pain to buttock and posterior thighs and calves. Pt with multiple comorbidities, please refer to chart for full details  are also affecting patient's functional outcome.   REHAB POTENTIAL: Fair    CLINICAL DECISION MAKING: Evolving/moderate complexity  EVALUATION COMPLEXITY: High  PLAN:  PT FREQUENCY: 2x/week  PT DURATION: 12 weeks  PLANNED INTERVENTIONS: Therapeutic exercises, Therapeutic activity, Neuromuscular re-education, Balance training, Gait training, Patient/Family education, Self Care, Joint mobilization, Stair training, Vestibular training, Canalith repositioning, Visual/preceptual remediation/compensation, Orthotic/Fit training, DME instructions, Wheelchair mobility training, Spinal mobilization, Cryotherapy, Moist heat, Splintting, Taping, and Manual therapy  PLAN FOR NEXT SESSION: hamtrings loading, hip strength, mobility, endurance, gait and balance, cardio, bed mobility   7:53 PM, 05/20/23    Baird Kay, PT 05/20/2023, 7:53 PM  Physical Therapist - [facility]  Outpatient Physical Therapy- Main Campus [phone]

## 2023-05-25 ENCOUNTER — Ambulatory Visit: Payer: Medicare PPO

## 2023-05-25 DIAGNOSIS — M6281 Muscle weakness (generalized): Secondary | ICD-10-CM | POA: Diagnosis not present

## 2023-05-25 DIAGNOSIS — R2681 Unsteadiness on feet: Secondary | ICD-10-CM

## 2023-05-25 DIAGNOSIS — M25552 Pain in left hip: Secondary | ICD-10-CM | POA: Diagnosis not present

## 2023-05-25 DIAGNOSIS — R262 Difficulty in walking, not elsewhere classified: Secondary | ICD-10-CM

## 2023-05-25 DIAGNOSIS — R42 Dizziness and giddiness: Secondary | ICD-10-CM | POA: Diagnosis not present

## 2023-05-25 DIAGNOSIS — R278 Other lack of coordination: Secondary | ICD-10-CM | POA: Diagnosis not present

## 2023-05-25 NOTE — Therapy (Signed)
OUTPATIENT NEURO PHYSICAL THERAPY TREATMENT    Patient Name: Kendra [name] MRN: [id number] DOB:October 13, 1946, 77 y.o., female Today's Date: 05/25/2023  PCP: Erasmo Downer, MD REFERRING PROVIDER: Erasmo Downer, MD  END OF SESSION:  PT End of Session - 05/25/23 1106     Visit Number 55    Number of Visits 60    Date for PT Re-Evaluation 06/08/23    Authorization Type [employer] PPO    Authorization Time Period 11/04/2022-12/30/2022    Progress Note Due on Visit 30    PT Start Time 1102    PT Stop Time 1144    PT Time Calculation (min) 42 min    Equipment Utilized During Treatment Gait belt    Activity Tolerance Patient tolerated treatment well;Patient limited by fatigue    Behavior During Therapy WFL for tasks assessed/performed                           Past Medical History:  Diagnosis Date   Anxiety    Breast cancer (HCC) 03/2018   right breast cancer at 11:00 and 1:00   Bursitis    bilateral hips and knees   Complication of anesthesia    Depression    Family history of breast cancer 03/24/2018   Family history of lung cancer 03/24/2018   History of alcohol dependence (HCC) 2007   no ETOH; resolved since 2007   History of hiatal hernia    History of psychosis 2014   due to sleep disturbance   Hypertension    Parkinson disease    Personal history of radiation therapy 06/2018-07/2018   right breast ca   PONV (postoperative nausea and vomiting)    Past Surgical History:  Procedure Laterality Date   BREAST BIOPSY Right 03/14/2018   11:00 DCIS and invasive ductal carcinoma   BREAST BIOPSY Right 03/14/2018   1:00 Invasive ductal carcinoma   BREAST LUMPECTOMY Right 04/27/2018   lumpectomy of 11 and 1:00 cancers, clear margins, negative LN   BREAST LUMPECTOMY WITH RADIOACTIVE SEED AND SENTINEL LYMPH NODE BIOPSY Right 04/27/2018   Procedure: RIGHT BREAST LUMPECTOMY WITH RADIOACTIVE SEED X 2 AND RIGHT SENTINEL LYMPH NODE BIOPSY;   Surgeon: Glenna Fellows, MD;  Location: [facility];  Service: General;  Laterality: Right;   CATARACT EXTRACTION W/PHACO Left 08/30/2019   Procedure: CATARACT EXTRACTION PHACO AND INTRAOCULAR LENS PLACEMENT (IOC) LEFT panoptix toric  01:20.5  12.7%  10.26;  Surgeon: Lockie Mola, MD;  Location: [facility];  Service: Ophthalmology;  Laterality: Left;   CATARACT EXTRACTION W/PHACO Right 09/20/2019   Procedure: CATARACT EXTRACTION PHACO AND INTRAOCULAR LENS PLACEMENT (IOC) RIGHT 5.71  00:36.4  15.7%;  Surgeon: Lockie Mola, MD;  Location: [facility];  Service: Ophthalmology;  Laterality: Right;   LAPAROTOMY N/A 02/10/2018   Procedure: EXPLORATORY LAPAROTOMY;  Surgeon: Shonna Chock, MD;  Location: [facility];  Service: Gynecology;  Laterality: N/A;   MASS EXCISION  01/2018   abdominal   OVARIAN CYST REMOVAL     SALPINGOOPHORECTOMY Bilateral 02/10/2018   Procedure: BILATERAL SALPINGO OOPHORECTOMY; PERITONEAL WASHINGS;  Surgeon: Shonna Chock, MD;  Location: [facility];  Service: Gynecology;  Laterality: Bilateral;   TONSILLECTOMY     TONSILLECTOMY AND ADENOIDECTOMY  1953   Patient Active Problem List   Diagnosis Date Noted   Neurogenic bladder 05/14/2023   Prediabetes 12/15/2022   Chronic cough 08/17/2022   History of allergy to shellfish 02/24/2022  Chronic low back pain of over 3 months duration 02/04/2022   Osteoarthritis of  AC (acromioclavicular) joint (Left) 02/04/2022   Osteoarthritis of hip (Right) 02/04/2022   Impaction fracture of distal end of radius and ulna, sequela (Left) 02/04/2022   Osteoarthritis of 1st (thumb) carpometacarpal (CMC) joint of hand (Left) 02/04/2022   Lumbosacral facet arthropathy (Multilevel) (Bilateral) (T12-S1) 02/04/2022   Lumbar central spinal stenosis w/o neurogenic claudication (L2-3, L3-4, L4-5) 02/04/2022   Lumbar foraminal stenosis (Bilateral: L2-3, L4-5) (Right: (Severe) L4-5) 02/04/2022   Lumbar  lateral recess stenosis (Bilateral: L2-3, L3-4, and L4-5) (Severe) 02/04/2022   DDD (degenerative disc disease), lumbar 02/04/2022   Lumbosacral facet syndrome (Multilevel) (Bilateral) 02/04/2022   Osteoarthritis of lumbar spine 02/04/2022   Allergic conjunctivitis of both eyes 12/23/2021   Chronic hip pain (3ry area of Pain) (Bilateral) (L>R) 12/22/2021   Chronic lower extremity pain (2ry area of Pain) (Bilateral) (L>R) 12/22/2021   Abnormal MRI, lumbar spine (01/31/2020) 12/22/2021   Grade 1 Anterolisthesis of lumbar spine (L3/L4) & (5 mm) (L4/L5) 12/22/2021   Chronic pain syndrome 11/17/2021   High risk medication use 11/17/2021   Disorder of skeletal system 11/17/2021   Problems influencing health status 11/17/2021   Other hyperlipidemia 10/21/2021   Polypharmacy 08/12/2021   Chronic low back pain (1ry area of Pain) (Bilateral) (L>R) w/o sciatica 06/18/2021   Osteopenia of neck of femur (Right) 09/06/2020   Obesity 08/29/2020   Parkinson disease 02/22/2020   Mucinous cystadenoma 12/24/2019   Bipolar disorder, in full remission, most recent episode depressed (HCC) 11/08/2019   Vitamin D insufficiency 08/24/2019   Insomnia due to mental condition 05/31/2019   Bipolar I disorder, most recent episode depressed (HCC) 05/31/2019   Alcohol use disorder, moderate, in sustained remission (HCC) 05/31/2019   Elevated tumor markers 05/27/2019   GAD (generalized anxiety disorder) 04/10/2019   MDD (major depressive disorder), severe (HCC) 01/31/2019   High serum carbohydrate antigen 19-9 (CA19-9) 12/01/2018   Adjustment disorder with anxious mood 11/24/2018   Essential hypertension 11/24/2018   B12 deficiency 10/17/2018   Incisional hernia, without obstruction or gangrene 09/12/2018   Genetic testing 04/07/2018   Family history of breast cancer 03/24/2018   Family history of lung cancer 03/24/2018   Malignant neoplasm of upper-outer quadrant of right breast in female, estrogen receptor  positive (HCC) 03/17/2018   Malignant neoplasm of upper-inner quadrant of right breast in female, estrogen receptor positive (HCC) 03/17/2018   History of psychosis 02/07/2018   History of insomnia 02/07/2018    ONSET DATE: Pt diagnosed with PD in 2019  REFERRING DIAG:  G20 (ICD-10-CM) - Parkinson's disease      THERAPY DIAG:  Difficulty in walking, not elsewhere classified  Muscle weakness (generalized)  Other lack of coordination  Unsteadiness on feet  Pain in left hip  Dizziness and giddiness  Rationale for Evaluation and Treatment: Rehabilitation  SUBJECTIVE: Pt with no updates, no new concerns.  Pain: L hip pain Pt accompanied by: significant other self, brought to session by caregiver  PERTINENT HISTORY:  Pt is a pleasant 77 yo female who was diagnosed with PD in 2019. She is known to clinic and [facility], and was last seen for LSVT BIG Sept-Oct 2022. Pt with chronic pain and has become increasingly sedentary as a result exacerbating mobility difficulties due to PD. Current concerns: Pt reports L and R hip pain and back pain. Pt was being seen by pain management clinic. Pt has had injections that provided relief at the time. Pt's  spouse reports her physician determined surgery was not an option for her pain so the plan is to manage it. Pt spouse thinks pain has resulted in reluctance for pt to move. Pt currently sedentary, doesn't walk much, but when she does she holds on to furniture for support due to balance problems. When pt first stands she must hold on to something due to mix of pain and/or dizziness (described as lightheadedness). Her pain can go from 3/10 to 9/10. It does improve once she starts moving. Pt has increased difficulty with multiple activities including: getting in and out of her bed, getting in and out of her car, freezing, decreased gait speed, decreased balance and cognition, and difficulty using BUE due to tremors.  Per chart other PMH includes hx of  breast cancer, HTN, MCI, insomnia, cataracts, bipolar 1, alcohol use disorder, GAD, MDD, osteopenia of neck of R femur, chronic B low back pain with radiating pain to buttock and posterior thighs and calves. Pt with multiple comorbidities, please refer to chart for full details    PAIN: from eval Are you having pain? Yes: NPRS scale: not rated currently but can reach 9/10 Pain location: L hip and L leg Pain description: pt has difficulty describing Aggravating factors: unsure Relieving factors: laying in the recliner helps, movement  PRECAUTIONS: Fall  WEIGHT BEARING RESTRICTIONS: No  FALLS: Has patient fallen in last 6 months? Yes. Number of falls 1x, leg tangled in chair leg when getting up from a chair  PLOF: Independent with basic ADLs  PATIENT GOALS: improve mobility   BLE Strength Assessment      Right  Left  Hip Flexion 5/5 4/5 (painful)  Hip Horizontal ABDCT  5/5 5/5  Hip Horizontal ADD 4+/5 4+/5  Hip IR (seated)  4+/5 4+/5  Hip ER (seated) 5/5 5/5  Knee Extension 5/5 5/5  Knee Flexion (seated) 4+/5  5/5 (not painful)  Hip ABDCT (supine)  3+/5 4-/5   Focal popliteal edema Left knee, not painful   ROM Assessment Hip     Right  Left   Flexion Supine 132 120 (ouch)  External rotation (90/90) 30 47  Internal Rotation (90/90) 40  20  Supine Hip Extension P/ROM >0 degrees >0 degrees       IMAGING: via chart   07/24/22 DG BONE density " ASSESSMENT: The probability of a major osteoporotic fracture is 15.8% within the next ten years.   The probability of a hip fracture is 2.9% within the next ten years."   12/22/21 DG lumbar spine " IMPRESSION: 1. No acute displaced fracture or traumatic listhesis of the lumbar spine. 2. Interval development of grade 1 anterolisthesis of L3 on L4. Stable grade 1 anterolisthesis of L4 on L5. Appears stable on flexion extension views. 3.  Aortic Atherosclerosis (ICD10-I70.0)."   New from chart:  DG Lumbar spine 01/05/23:  "IMPRESSION: 1. Lumbar spine scoliosis concave right. Diffuse multilevel degenerative change. Disc degeneration most prominent at L2-L3 and L5-S1. 5 mm anterolisthesis L4 on L5. Diffuse severe facet hypertrophy. No flexion or extension abnormality identified. No evidence of fracture. 2. Sclerotic densities are noted in the L2 and L4 vertebral bodies. Although these could represent bone islands blastic metastatic disease cannot be excluded. MRI of the lumbar spine suggested for further evaluation. 3. Large amount of stool noted throughout the colon. 4. Aortoiliac atherosclerotic vascular disease."    DG hip 01/05/23: "IMPRESSION: Moderate bilateral femoroacetabular osteoarthritis, similar to prior."   PATIENT EDUCATION:  Education details: Pt educated throughout  session about proper posture and technique with exercises. Improved exercise technique, movement at target joints, use of target muscles after min to mod verbal, visual, tactile cues.  Person educated: Patient and Spouse Education method: Explanation, Demonstration, and Verbal cues Education comprehension: verbalized understanding, returned demonstration, verbal cues required, tactile cues required, and needs further education   TODAY'S TREATMENT:                                                                                                                                         DATE: 05/25/23   Gait belt donned and CGA provided throughout unless specified otherwise  NMR: LTL stepping onto 6" step between two airex pads x 2 rounds of multiple reps with UE support. Pt rates hard. Pt limited by dizziness/feeling of self-spinning which improve with rest.   Gait in // bars with intermittent UE support FWD/BCKWD x 2 rounds of 8-10 reps. Pt rates hard, fatiguing. Requires recovery interval.  In // bars LTL stepping on airex beam with UUE to intermittent UE support 6x. Pt rates hard.  Ambulation down hallway with a mix of  vertical and horizontal head turns, reading sticky notes/pictures on walls x 2 rounds. Requires rest break between sets.   THEREX:   Ambulation for endurance with 4WW and 5# AW 1 x 296 ft.  STS 3x10 hands-free    HOME EXERCISE PROGRAM: pt to continue HEP as previously given  Access Code: [email or web address] URL: [email or web address] Date: 12/22/2022 Prepared by: Temple Pacini  Exercises - Sit to Stand with Counter Support  - 1 x daily - 5 x weekly - 3 sets - 10 reps  GOALS: Goals reviewed with patient? Yes  SHORT TERM GOALS: Target date: 04/27/2023    Patient will be independent in home exercise program to improve strength/mobility for better functional independence with ADLs. Baseline: LSVT BIG protocol to be initiated next session; 12/18: not yet fully indep, reports she still gets confused about the technique; 12/03/2022: HEP to be updated; Pt reports practicing HEP 2x/week; 02/23/23: not currently performing HEP, reviewed in session and provided new copy; 03/16/2023: pt reports performing HEP every day; 03/30/23: Pt reports she is not being consistent with her HEP; 7/1: pt reports she is not doing much at home Goal status: MET (previously MET)   LONG TERM GOALS: Target date: 06/08/2023     Patient will increase FOTO score to equal to or greater than 49 to demonstrate statistically significant improvement in mobility and quality of life.  Baseline: 40; 12/18: deferred due to time; 45; 3/19: deferred to next visit; 01/21/2023; 02/23/2023: 46 unchanged from previous assessment); 03/17/2023: 48 Goal status: PARTIALLY MET  2.  Patient (> 35 years old) will complete five times sit to stand test in < 15 seconds indicating decreased fall risk.  Baseline: 27 sec; 12/18 23 sec hands-free; 11/04/22: 24 sec hands free; 2/1/124: 22.7 sec use of BUE, 21.3  sec hands-free; 01/19/23: 18 seconds hands-free; 02/23/23: 19 seconds hands-free; 03/16/2023: 18 seconds hands-free; 03/30/23: 17.3 seconds  hands-free; 05/03/23: 17.3 seconds hands-free  Goal status: IN PROGRESS  3.  Patient will increase 10 meter walk test to >1.24m/s with WFL B step-length, upright posture for >90% of test to improve ease with gait and to override hypokentic and bradykinetic movement.  Baseline: 0.58 m/s with 1OX; 12/18: 0.7 m/s with 0RU; 11/04/22: 0.69 m/s with 0AV;  12/03/22: 0.73 m/s with 4UJ; 01/19/2023: 0.77 m/s with 8JX; 02/23/2023: 0.83 m/s with 9JY; 03/16/2023: 0.83 m/s with 7WG , pt still somewhat crouched, requires cues for upright posture >50% of time; 03/30/23: 0.83 m/s with crouched posture; 05/03/23 0.8 m/s with RW, some improvement with upright posture  Goal status: IN PROGRESS  4.  Patient will reduce time to don jacket by at least 5 seconds in order to increase ease with dressing and override bradykinetic movement Baseline: 17 seconds; 12/18: 12 seconds Goal status: MET  5.  Pt will demo or report ability to open standard jar using only 1-2 attempts in order to exhibit improved functional mobility for ADLs.  Baseline: 5.7 seconds with multiple attempts to turn jar lid to open; 12/18: 2.8 sec opens in 2 turns Goal status: MET  6.  Patient will increase Berg Balance score by > 6 points to demonstrate decreased fall risk during functional activities. Baseline: to be tested next 1-2 visits; 12/03/2022: 44/56; 01/21/23 45/56; 48/56; 03/17/2023: 48/56; 05/03/23: 50/56 Goal status: Partially met   7.  Patient will report at least 4 days in a row with minimal to no L hip pain in order to improve QOL and ease/safety with functional mobility. Baseline: Pt reports pain majority of days in L hip; 12/03/2022: pt reports having hip pain about every day; 3/19: rates hip pain daily (being seen now by MD, new imaging); 02/23/2023: pt went for period of time without hip pain (she thinks a week), but reports it has now come back,; 5/28: pt reports hip pain "every other day"; 05/03/23: pt reports pain in hip every day in past week Goal  status: NOT MET/discontinued   8. Patient will increase six minute walk test distance to >1000 for progression to community ambulator and improve gait ability Baseline:02/23/2023: 722 ft with 4WW and two standing rest breaks, rates test as hard; 03/16/2023: deferred to next visit due to time; 5/28/20204: 641 ft with 4WW, pt requests to end test early due to fatigue at 5 min 35 sec; 05/03/23: 645 ft with 4WW and able to complete full test  Goal status: IN PROGRESS  ASSESSMENT:  CLINICAL IMPRESSION: Start of session focused on dynamic balance activities. Pt most limited by fatigue, and generally rated interventions as hard, but showed little decrease in postural stability/balance. Plan to resume circuit training next visit for cardio/endurance training.The pt will benefit from further skilled PT to improve strength, balance, gait and mobility.  OBJECTIVE IMPAIRMENTS: Abnormal gait, decreased balance, decreased cognition, decreased coordination, decreased endurance, decreased knowledge of use of DME, decreased mobility, difficulty walking, decreased strength, dizziness, hypomobility, impaired UE functional use, improper body mechanics, postural dysfunction, and pain.   ACTIVITY LIMITATIONS: lifting, bending, standing, squatting, stairs, transfers, bed mobility, bathing, dressing, hygiene/grooming, and locomotion level  PARTICIPATION LIMITATIONS: meal prep, cleaning, laundry, medication management, personal finances, driving, shopping, community activity, and yard work  PERSONAL FACTORS: Age, Fitness, Sex, Time since onset of injury/illness/exacerbation, and 3+ comorbidities: PMH includes hx of breast cancer, HTN, MCI, insomnia, cataracts, bipolar 1, alcohol  use disorder, GAD, MDD, osteopenia of neck of R femur, chronic B low back pain with radiating pain to buttock and posterior thighs and calves. Pt with multiple comorbidities, please refer to chart for full details  are also affecting patient's  functional outcome.   REHAB POTENTIAL: Fair    CLINICAL DECISION MAKING: Evolving/moderate complexity  EVALUATION COMPLEXITY: High  PLAN:  PT FREQUENCY: 2x/week  PT DURATION: 12 weeks  PLANNED INTERVENTIONS: Therapeutic exercises, Therapeutic activity, Neuromuscular re-education, Balance training, Gait training, Patient/Family education, Self Care, Joint mobilization, Stair training, Vestibular training, Canalith repositioning, Visual/preceptual remediation/compensation, Orthotic/Fit training, DME instructions, Wheelchair mobility training, Spinal mobilization, Cryotherapy, Moist heat, Splintting, Taping, and Manual therapy  PLAN FOR NEXT SESSION: hamtrings loading, hip strength, mobility, endurance, gait and balance, cardio, bed mobility   12:00 PM, 05/25/23    Baird Kay, PT 05/25/2023, 12:00 PM  Physical Therapist - [facility]  Outpatient Physical Therapy- Main Campus [phone]

## 2023-05-27 ENCOUNTER — Other Ambulatory Visit: Payer: Self-pay | Admitting: Internal Medicine

## 2023-05-27 ENCOUNTER — Ambulatory Visit: Payer: Medicare PPO

## 2023-05-27 DIAGNOSIS — R2681 Unsteadiness on feet: Secondary | ICD-10-CM | POA: Diagnosis not present

## 2023-05-27 DIAGNOSIS — M25552 Pain in left hip: Secondary | ICD-10-CM | POA: Diagnosis not present

## 2023-05-27 DIAGNOSIS — R278 Other lack of coordination: Secondary | ICD-10-CM

## 2023-05-27 DIAGNOSIS — R42 Dizziness and giddiness: Secondary | ICD-10-CM | POA: Diagnosis not present

## 2023-05-27 DIAGNOSIS — R262 Difficulty in walking, not elsewhere classified: Secondary | ICD-10-CM | POA: Diagnosis not present

## 2023-05-27 DIAGNOSIS — M6281 Muscle weakness (generalized): Secondary | ICD-10-CM

## 2023-05-27 NOTE — Therapy (Signed)
OUTPATIENT NEURO PHYSICAL THERAPY TREATMENT    Patient Name: Kendra [name] MRN: [id number] DOB:24-Jul-1946, 77 y.o., female Today's Date: 05/27/2023  PCP: Erasmo Downer, MD REFERRING PROVIDER: Erasmo Downer, MD  END OF SESSION:  PT End of Session - 05/27/23 1316     Visit Number 56    Number of Visits 60    Date for PT Re-Evaluation 06/08/23    Authorization Type [employer] PPO    Authorization Time Period 11/04/2022-12/30/2022    Progress Note Due on Visit 30    PT Start Time 1317    PT Stop Time 1359    PT Time Calculation (min) 42 min    Equipment Utilized During Treatment Gait belt    Activity Tolerance Patient tolerated treatment well;Patient limited by fatigue    Behavior During Therapy WFL for tasks assessed/performed                           Past Medical History:  Diagnosis Date   Anxiety    Breast cancer (HCC) 03/2018   right breast cancer at 11:00 and 1:00   Bursitis    bilateral hips and knees   Complication of anesthesia    Depression    Family history of breast cancer 03/24/2018   Family history of lung cancer 03/24/2018   History of alcohol dependence (HCC) 2007   no ETOH; resolved since 2007   History of hiatal hernia    History of psychosis 2014   due to sleep disturbance   Hypertension    Parkinson disease    Personal history of radiation therapy 06/2018-07/2018   right breast ca   PONV (postoperative nausea and vomiting)    Past Surgical History:  Procedure Laterality Date   BREAST BIOPSY Right 03/14/2018   11:00 DCIS and invasive ductal carcinoma   BREAST BIOPSY Right 03/14/2018   1:00 Invasive ductal carcinoma   BREAST LUMPECTOMY Right 04/27/2018   lumpectomy of 11 and 1:00 cancers, clear margins, negative LN   BREAST LUMPECTOMY WITH RADIOACTIVE SEED AND SENTINEL LYMPH NODE BIOPSY Right 04/27/2018   Procedure: RIGHT BREAST LUMPECTOMY WITH RADIOACTIVE SEED X 2 AND RIGHT SENTINEL LYMPH NODE BIOPSY;   Surgeon: Glenna Fellows, MD;  Location: [facility];  Service: General;  Laterality: Right;   CATARACT EXTRACTION W/PHACO Left 08/30/2019   Procedure: CATARACT EXTRACTION PHACO AND INTRAOCULAR LENS PLACEMENT (IOC) LEFT panoptix toric  01:20.5  12.7%  10.26;  Surgeon: Lockie Mola, MD;  Location: [facility];  Service: Ophthalmology;  Laterality: Left;   CATARACT EXTRACTION W/PHACO Right 09/20/2019   Procedure: CATARACT EXTRACTION PHACO AND INTRAOCULAR LENS PLACEMENT (IOC) RIGHT 5.71  00:36.4  15.7%;  Surgeon: Lockie Mola, MD;  Location: [facility];  Service: Ophthalmology;  Laterality: Right;   LAPAROTOMY N/A 02/10/2018   Procedure: EXPLORATORY LAPAROTOMY;  Surgeon: Shonna Chock, MD;  Location: [facility];  Service: Gynecology;  Laterality: N/A;   MASS EXCISION  01/2018   abdominal   OVARIAN CYST REMOVAL     SALPINGOOPHORECTOMY Bilateral 02/10/2018   Procedure: BILATERAL SALPINGO OOPHORECTOMY; PERITONEAL WASHINGS;  Surgeon: Shonna Chock, MD;  Location: [facility];  Service: Gynecology;  Laterality: Bilateral;   TONSILLECTOMY     TONSILLECTOMY AND ADENOIDECTOMY  1953   Patient Active Problem List   Diagnosis Date Noted   Neurogenic bladder 05/14/2023   Prediabetes 12/15/2022   Chronic cough 08/17/2022   History of allergy to shellfish 02/24/2022  Chronic low back pain of over 3 months duration 02/04/2022   Osteoarthritis of  AC (acromioclavicular) joint (Left) 02/04/2022   Osteoarthritis of hip (Right) 02/04/2022   Impaction fracture of distal end of radius and ulna, sequela (Left) 02/04/2022   Osteoarthritis of 1st (thumb) carpometacarpal (CMC) joint of hand (Left) 02/04/2022   Lumbosacral facet arthropathy (Multilevel) (Bilateral) (T12-S1) 02/04/2022   Lumbar central spinal stenosis w/o neurogenic claudication (L2-3, L3-4, L4-5) 02/04/2022   Lumbar foraminal stenosis (Bilateral: L2-3, L4-5) (Right: (Severe) L4-5) 02/04/2022   Lumbar  lateral recess stenosis (Bilateral: L2-3, L3-4, and L4-5) (Severe) 02/04/2022   DDD (degenerative disc disease), lumbar 02/04/2022   Lumbosacral facet syndrome (Multilevel) (Bilateral) 02/04/2022   Osteoarthritis of lumbar spine 02/04/2022   Allergic conjunctivitis of both eyes 12/23/2021   Chronic hip pain (3ry area of Pain) (Bilateral) (L>R) 12/22/2021   Chronic lower extremity pain (2ry area of Pain) (Bilateral) (L>R) 12/22/2021   Abnormal MRI, lumbar spine (01/31/2020) 12/22/2021   Grade 1 Anterolisthesis of lumbar spine (L3/L4) & (5 mm) (L4/L5) 12/22/2021   Chronic pain syndrome 11/17/2021   High risk medication use 11/17/2021   Disorder of skeletal system 11/17/2021   Problems influencing health status 11/17/2021   Other hyperlipidemia 10/21/2021   Polypharmacy 08/12/2021   Chronic low back pain (1ry area of Pain) (Bilateral) (L>R) w/o sciatica 06/18/2021   Osteopenia of neck of femur (Right) 09/06/2020   Obesity 08/29/2020   Parkinson disease 02/22/2020   Mucinous cystadenoma 12/24/2019   Bipolar disorder, in full remission, most recent episode depressed (HCC) 11/08/2019   Vitamin D insufficiency 08/24/2019   Insomnia due to mental condition 05/31/2019   Bipolar I disorder, most recent episode depressed (HCC) 05/31/2019   Alcohol use disorder, moderate, in sustained remission (HCC) 05/31/2019   Elevated tumor markers 05/27/2019   GAD (generalized anxiety disorder) 04/10/2019   MDD (major depressive disorder), severe (HCC) 01/31/2019   High serum carbohydrate antigen 19-9 (CA19-9) 12/01/2018   Adjustment disorder with anxious mood 11/24/2018   Essential hypertension 11/24/2018   B12 deficiency 10/17/2018   Incisional hernia, without obstruction or gangrene 09/12/2018   Genetic testing 04/07/2018   Family history of breast cancer 03/24/2018   Family history of lung cancer 03/24/2018   Malignant neoplasm of upper-outer quadrant of right breast in female, estrogen receptor  positive (HCC) 03/17/2018   Malignant neoplasm of upper-inner quadrant of right breast in female, estrogen receptor positive (HCC) 03/17/2018   History of psychosis 02/07/2018   History of insomnia 02/07/2018    ONSET DATE: Pt diagnosed with PD in 2019  REFERRING DIAG:  G20 (ICD-10-CM) - Parkinson's disease      THERAPY DIAG:  Muscle weakness (generalized)  Other lack of coordination  Difficulty in walking, not elsewhere classified  Unsteadiness on feet  Rationale for Evaluation and Treatment: Rehabilitation  SUBJECTIVE: Pt reports moderate hip pain currently. She reports on other updates.   Pain: L hip pain Pt accompanied by: significant other self, brought to session by caregiver  PERTINENT HISTORY:  Pt is a pleasant 77 yo female who was diagnosed with PD in 2019. She is known to clinic and [facility], and was last seen for LSVT BIG Sept-Oct 2022. Pt with chronic pain and has become increasingly sedentary as a result exacerbating mobility difficulties due to PD. Current concerns: Pt reports L and R hip pain and back pain. Pt was being seen by pain management clinic. Pt has had injections that provided relief at the time. Pt's spouse reports her physician  determined surgery was not an option for her pain so the plan is to manage it. Pt spouse thinks pain has resulted in reluctance for pt to move. Pt currently sedentary, doesn't walk much, but when she does she holds on to furniture for support due to balance problems. When pt first stands she must hold on to something due to mix of pain and/or dizziness (described as lightheadedness). Her pain can go from 3/10 to 9/10. It does improve once she starts moving. Pt has increased difficulty with multiple activities including: getting in and out of her bed, getting in and out of her car, freezing, decreased gait speed, decreased balance and cognition, and difficulty using BUE due to tremors.  Per chart other PMH includes hx of breast cancer,  HTN, MCI, insomnia, cataracts, bipolar 1, alcohol use disorder, GAD, MDD, osteopenia of neck of R femur, chronic B low back pain with radiating pain to buttock and posterior thighs and calves. Pt with multiple comorbidities, please refer to chart for full details    PAIN: from eval Are you having pain? Yes: NPRS scale: not rated currently but can reach 9/10 Pain location: L hip and L leg Pain description: pt has difficulty describing Aggravating factors: unsure Relieving factors: laying in the recliner helps, movement  PRECAUTIONS: Fall  WEIGHT BEARING RESTRICTIONS: No  FALLS: Has patient fallen in last 6 months? Yes. Number of falls 1x, leg tangled in chair leg when getting up from a chair  PLOF: Independent with basic ADLs  PATIENT GOALS: improve mobility   BLE Strength Assessment      Right  Left  Hip Flexion 5/5 4/5 (painful)  Hip Horizontal ABDCT  5/5 5/5  Hip Horizontal ADD 4+/5 4+/5  Hip IR (seated)  4+/5 4+/5  Hip ER (seated) 5/5 5/5  Knee Extension 5/5 5/5  Knee Flexion (seated) 4+/5  5/5 (not painful)  Hip ABDCT (supine)  3+/5 4-/5   Focal popliteal edema Left knee, not painful   ROM Assessment Hip     Right  Left   Flexion Supine 132 120 (ouch)  External rotation (90/90) 30 47  Internal Rotation (90/90) 40  20  Supine Hip Extension P/ROM >0 degrees >0 degrees       IMAGING: via chart   07/24/22 DG BONE density " ASSESSMENT: The probability of a major osteoporotic fracture is 15.8% within the next ten years.   The probability of a hip fracture is 2.9% within the next ten years."   12/22/21 DG lumbar spine " IMPRESSION: 1. No acute displaced fracture or traumatic listhesis of the lumbar spine. 2. Interval development of grade 1 anterolisthesis of L3 on L4. Stable grade 1 anterolisthesis of L4 on L5. Appears stable on flexion extension views. 3.  Aortic Atherosclerosis (ICD10-I70.0)."   New from chart:  DG Lumbar spine 01/05/23: "IMPRESSION: 1.  Lumbar spine scoliosis concave right. Diffuse multilevel degenerative change. Disc degeneration most prominent at L2-L3 and L5-S1. 5 mm anterolisthesis L4 on L5. Diffuse severe facet hypertrophy. No flexion or extension abnormality identified. No evidence of fracture. 2. Sclerotic densities are noted in the L2 and L4 vertebral bodies. Although these could represent bone islands blastic metastatic disease cannot be excluded. MRI of the lumbar spine suggested for further evaluation. 3. Large amount of stool noted throughout the colon. 4. Aortoiliac atherosclerotic vascular disease."    DG hip 01/05/23: "IMPRESSION: Moderate bilateral femoroacetabular osteoarthritis, similar to prior."   PATIENT EDUCATION:  Education details: Pt educated throughout session about proper posture  and technique with exercises. Improved exercise technique, movement at target joints, use of target muscles after min to mod verbal, visual, tactile cues.  Person educated: Patient and Spouse Education method: Explanation, Demonstration, and Verbal cues Education comprehension: verbalized understanding, returned demonstration, verbal cues required, tactile cues required, and needs further education   TODAY'S TREATMENT:                                                                                                                                         DATE: 05/27/23   Gait belt donned and CGA provided throughout unless specified otherwise  Circuit training incorporates both TE and NMR  Circuit 1: Ambulation for endurance with 4WW and 4# AW 1 x 592 ft.  Large amplitude movement:  Seated FWD reach to floor>reach above head>bilat shoulder abduction (90 deg) with 5 sec hold x 10x  Seated bilat shoulder abduction and reaching to floor each side 10x STS with overhead press (2.5# bar) 8x - limited by L hip pain   Circuit 2: Ambulation for endurance with 4WW and 4# AW 1 x 296 ft.  Large amplitude movement:   Seated FWD reach to floor>reach above head>bilat shoulder abduction (90 deg) with 5 sec hold x 10x  Seated reach and twist 10x each way STS with overhead press (2.5# bar) 10x -no hip pain   Circuit 3: Ambulation for endurance with 4WW and 4# AW 1 x 296 ft.  LAQ 15x each LE with 4# AW each LE  Seated LTL step tap onto 6" step 15x each LE with 4# AW each LE Standing LTL step and BUE reach 10x each way STS 10x     HOME EXERCISE PROGRAM: pt to continue HEP as previously given  Access Code: [email or web address] URL: [email or web address] Date: 12/22/2022 Prepared by: Temple Pacini  Exercises - Sit to Stand with Counter Support  - 1 x daily - 5 x weekly - 3 sets - 10 reps  GOALS: Goals reviewed with patient? Yes  SHORT TERM GOALS: Target date: 04/27/2023    Patient will be independent in home exercise program to improve strength/mobility for better functional independence with ADLs. Baseline: LSVT BIG protocol to be initiated next session; 12/18: not yet fully indep, reports she still gets confused about the technique; 12/03/2022: HEP to be updated; Pt reports practicing HEP 2x/week; 02/23/23: not currently performing HEP, reviewed in session and provided new copy; 03/16/2023: pt reports performing HEP every day; 03/30/23: Pt reports she is not being consistent with her HEP; 7/1: pt reports she is not doing much at home Goal status: MET (previously MET)   LONG TERM GOALS: Target date: 06/08/2023     Patient will increase FOTO score to equal to or greater than 49 to demonstrate statistically significant improvement in mobility and quality of life.  Baseline: 40; 12/18: deferred due to time; 45; 3/19: deferred to next visit; 01/21/2023; 02/23/2023: 46 unchanged  from previous assessment); 03/17/2023: 48 Goal status: PARTIALLY MET  2.  Patient (> 54 years old) will complete five times sit to stand test in < 15 seconds indicating decreased fall risk.  Baseline: 27 sec; 12/18 23 sec  hands-free; 11/04/22: 24 sec hands free; 2/1/124: 22.7 sec use of BUE, 21.3 sec hands-free; 01/19/23: 18 seconds hands-free; 02/23/23: 19 seconds hands-free; 03/16/2023: 18 seconds hands-free; 03/30/23: 17.3 seconds hands-free; 05/03/23: 17.3 seconds hands-free  Goal status: IN PROGRESS  3.  Patient will increase 10 meter walk test to >1.85m/s with WFL B step-length, upright posture for >90% of test to improve ease with gait and to override hypokentic and bradykinetic movement.  Baseline: 0.58 m/s with 1OX; 12/18: 0.7 m/s with 0RU; 11/04/22: 0.69 m/s with 0AV;  12/03/22: 0.73 m/s with 4UJ; 01/19/2023: 0.77 m/s with 8JX; 02/23/2023: 0.83 m/s with 9JY; 03/16/2023: 0.83 m/s with 7WG , pt still somewhat crouched, requires cues for upright posture >50% of time; 03/30/23: 0.83 m/s with crouched posture; 05/03/23 0.8 m/s with RW, some improvement with upright posture  Goal status: IN PROGRESS  4.  Patient will reduce time to don jacket by at least 5 seconds in order to increase ease with dressing and override bradykinetic movement Baseline: 17 seconds; 12/18: 12 seconds Goal status: MET  5.  Pt will demo or report ability to open standard jar using only 1-2 attempts in order to exhibit improved functional mobility for ADLs.  Baseline: 5.7 seconds with multiple attempts to turn jar lid to open; 12/18: 2.8 sec opens in 2 turns Goal status: MET  6.  Patient will increase Berg Balance score by > 6 points to demonstrate decreased fall risk during functional activities. Baseline: to be tested next 1-2 visits; 12/03/2022: 44/56; 01/21/23 45/56; 48/56; 03/17/2023: 48/56; 05/03/23: 50/56 Goal status: Partially met   7.  Patient will report at least 4 days in a row with minimal to no L hip pain in order to improve QOL and ease/safety with functional mobility. Baseline: Pt reports pain majority of days in L hip; 12/03/2022: pt reports having hip pain about every day; 3/19: rates hip pain daily (being seen now by MD, new imaging); 02/23/2023:  pt went for period of time without hip pain (she thinks a week), but reports it has now come back,; 5/28: pt reports hip pain "every other day"; 05/03/23: pt reports pain in hip every day in past week Goal status: NOT MET/discontinued   8. Patient will increase six minute walk test distance to >1000 for progression to community ambulator and improve gait ability Baseline:02/23/2023: 722 ft with 4WW and two standing rest breaks, rates test as hard; 03/16/2023: deferred to next visit due to time; 5/28/20204: 641 ft with 4WW, pt requests to end test early due to fatigue at 5 min 35 sec; 05/03/23: 645 ft with 4WW and able to complete full test  Goal status: IN PROGRESS  ASSESSMENT:  CLINICAL IMPRESSION: Pt completed circuit training this visit. She exhibits improved activity tolerance compared to last session. L hip pain does continue to be somewhat limiting pt's progression.The pt will benefit from further skilled PT to improve strength, balance, gait and mobility.  OBJECTIVE IMPAIRMENTS: Abnormal gait, decreased balance, decreased cognition, decreased coordination, decreased endurance, decreased knowledge of use of DME, decreased mobility, difficulty walking, decreased strength, dizziness, hypomobility, impaired UE functional use, improper body mechanics, postural dysfunction, and pain.   ACTIVITY LIMITATIONS: lifting, bending, standing, squatting, stairs, transfers, bed mobility, bathing, dressing, hygiene/grooming, and locomotion level  PARTICIPATION  LIMITATIONS: meal prep, cleaning, laundry, medication management, personal finances, driving, shopping, community activity, and yard work  PERSONAL FACTORS: Age, Fitness, Sex, Time since onset of injury/illness/exacerbation, and 3+ comorbidities: PMH includes hx of breast cancer, HTN, MCI, insomnia, cataracts, bipolar 1, alcohol use disorder, GAD, MDD, osteopenia of neck of R femur, chronic B low back pain with radiating pain to buttock and posterior thighs  and calves. Pt with multiple comorbidities, please refer to chart for full details  are also affecting patient's functional outcome.   REHAB POTENTIAL: Fair    CLINICAL DECISION MAKING: Evolving/moderate complexity  EVALUATION COMPLEXITY: High  PLAN:  PT FREQUENCY: 2x/week  PT DURATION: 12 weeks  PLANNED INTERVENTIONS: Therapeutic exercises, Therapeutic activity, Neuromuscular re-education, Balance training, Gait training, Patient/Family education, Self Care, Joint mobilization, Stair training, Vestibular training, Canalith repositioning, Visual/preceptual remediation/compensation, Orthotic/Fit training, DME instructions, Wheelchair mobility training, Spinal mobilization, Cryotherapy, Moist heat, Splintting, Taping, and Manual therapy  PLAN FOR NEXT SESSION: hamtrings loading, hip strength, mobility, endurance, gait and balance, cardio, bed mobility   2:02 PM, 05/27/23    Baird Kay, PT 05/27/2023, 2:02 PM  Physical Therapist - [name]  Outpatient Physical Therapy- Main Campus [phone]

## 2023-05-28 ENCOUNTER — Encounter: Payer: Self-pay | Admitting: Internal Medicine

## 2023-05-31 ENCOUNTER — Other Ambulatory Visit: Payer: Self-pay | Admitting: Family Medicine

## 2023-06-01 ENCOUNTER — Ambulatory Visit: Payer: Medicare PPO

## 2023-06-01 DIAGNOSIS — R42 Dizziness and giddiness: Secondary | ICD-10-CM | POA: Diagnosis not present

## 2023-06-01 DIAGNOSIS — R2681 Unsteadiness on feet: Secondary | ICD-10-CM

## 2023-06-01 DIAGNOSIS — R262 Difficulty in walking, not elsewhere classified: Secondary | ICD-10-CM

## 2023-06-01 DIAGNOSIS — M6281 Muscle weakness (generalized): Secondary | ICD-10-CM | POA: Diagnosis not present

## 2023-06-01 DIAGNOSIS — R278 Other lack of coordination: Secondary | ICD-10-CM | POA: Diagnosis not present

## 2023-06-01 DIAGNOSIS — M25552 Pain in left hip: Secondary | ICD-10-CM | POA: Diagnosis not present

## 2023-06-01 NOTE — Therapy (Signed)
OUTPATIENT NEURO PHYSICAL THERAPY TREATMENT    Patient Name: Kendra [name] MRN: [id number] DOB:1946-08-26, 77 y.o., female Today's Date: 06/01/2023  PCP: Erasmo Downer, MD REFERRING PROVIDER: Erasmo Downer, MD  END OF SESSION:  PT End of Session - 06/01/23 1336     Visit Number 57    Number of Visits 60    Date for PT Re-Evaluation 06/08/23    Authorization Type [employer] PPO    Authorization Time Period 11/04/2022-12/30/2022    Progress Note Due on Visit 30    PT Start Time 1150    PT Stop Time 1230    PT Time Calculation (min) 40 min    Equipment Utilized During Treatment Gait belt    Activity Tolerance Patient tolerated treatment well;Patient limited by fatigue    Behavior During Therapy [facility] for tasks assessed/performed                            Past Medical History:  Diagnosis Date   Anxiety    Breast cancer (HCC) 03/2018   right breast cancer at 11:00 and 1:00   Bursitis    bilateral hips and knees   Complication of anesthesia    Depression    Family history of breast cancer 03/24/2018   Family history of lung cancer 03/24/2018   History of alcohol dependence (HCC) 2007   no ETOH; resolved since 2007   History of hiatal hernia    History of psychosis 2014   due to sleep disturbance   Hypertension    Parkinson disease    Personal history of radiation therapy 06/2018-07/2018   right breast ca   PONV (postoperative nausea and vomiting)    Past Surgical History:  Procedure Laterality Date   BREAST BIOPSY Right 03/14/2018   11:00 DCIS and invasive ductal carcinoma   BREAST BIOPSY Right 03/14/2018   1:00 Invasive ductal carcinoma   BREAST LUMPECTOMY Right 04/27/2018   lumpectomy of 11 and 1:00 cancers, clear margins, negative LN   BREAST LUMPECTOMY WITH RADIOACTIVE SEED AND SENTINEL LYMPH NODE BIOPSY Right 04/27/2018   Procedure: RIGHT BREAST LUMPECTOMY WITH RADIOACTIVE SEED X 2 AND RIGHT SENTINEL LYMPH NODE BIOPSY;   Surgeon: Glenna Fellows, MD;  Location: [facility];  Service: General;  Laterality: Right;   CATARACT EXTRACTION W/PHACO Left 08/30/2019   Procedure: CATARACT EXTRACTION PHACO AND INTRAOCULAR LENS PLACEMENT (IOC) LEFT panoptix toric  01:20.5  12.7%  10.26;  Surgeon: Lockie Mola, MD;  Location: [facility];  Service: Ophthalmology;  Laterality: Left;   CATARACT EXTRACTION W/PHACO Right 09/20/2019   Procedure: CATARACT EXTRACTION PHACO AND INTRAOCULAR LENS PLACEMENT (IOC) RIGHT 5.71  00:36.4  15.7%;  Surgeon: Lockie Mola, MD;  Location: [facility];  Service: Ophthalmology;  Laterality: Right;   LAPAROTOMY N/A 02/10/2018   Procedure: EXPLORATORY LAPAROTOMY;  Surgeon: Shonna Chock, MD;  Location: [facility];  Service: Gynecology;  Laterality: N/A;   MASS EXCISION  01/2018   abdominal   OVARIAN CYST REMOVAL     SALPINGOOPHORECTOMY Bilateral 02/10/2018   Procedure: BILATERAL SALPINGO OOPHORECTOMY; PERITONEAL WASHINGS;  Surgeon: Shonna Chock, MD;  Location: [facility];  Service: Gynecology;  Laterality: Bilateral;   TONSILLECTOMY     TONSILLECTOMY AND ADENOIDECTOMY  1953   Patient Active Problem List   Diagnosis Date Noted   Neurogenic bladder 05/14/2023   Prediabetes 12/15/2022   Chronic cough 08/17/2022   History of allergy to shellfish 02/24/2022  Chronic low back pain of over 3 months duration 02/04/2022   Osteoarthritis of  AC (acromioclavicular) joint (Left) 02/04/2022   Osteoarthritis of hip (Right) 02/04/2022   Impaction fracture of distal end of radius and ulna, sequela (Left) 02/04/2022   Osteoarthritis of 1st (thumb) carpometacarpal (CMC) joint of hand (Left) 02/04/2022   Lumbosacral facet arthropathy (Multilevel) (Bilateral) (T12-S1) 02/04/2022   Lumbar central spinal stenosis w/o neurogenic claudication (L2-3, L3-4, L4-5) 02/04/2022   Lumbar foraminal stenosis (Bilateral: L2-3, L4-5) (Right: (Severe) L4-5) 02/04/2022   Lumbar  lateral recess stenosis (Bilateral: L2-3, L3-4, and L4-5) (Severe) 02/04/2022   DDD (degenerative disc disease), lumbar 02/04/2022   Lumbosacral facet syndrome (Multilevel) (Bilateral) 02/04/2022   Osteoarthritis of lumbar spine 02/04/2022   Allergic conjunctivitis of both eyes 12/23/2021   Chronic hip pain (3ry area of Pain) (Bilateral) (L>R) 12/22/2021   Chronic lower extremity pain (2ry area of Pain) (Bilateral) (L>R) 12/22/2021   Abnormal MRI, lumbar spine (01/31/2020) 12/22/2021   Grade 1 Anterolisthesis of lumbar spine (L3/L4) & (5 mm) (L4/L5) 12/22/2021   Chronic pain syndrome 11/17/2021   High risk medication use 11/17/2021   Disorder of skeletal system 11/17/2021   Problems influencing health status 11/17/2021   Other hyperlipidemia 10/21/2021   Polypharmacy 08/12/2021   Chronic low back pain (1ry area of Pain) (Bilateral) (L>R) w/o sciatica 06/18/2021   Osteopenia of neck of femur (Right) 09/06/2020   Obesity 08/29/2020   Parkinson disease 02/22/2020   Mucinous cystadenoma 12/24/2019   Bipolar disorder, in full remission, most recent episode depressed (HCC) 11/08/2019   Vitamin D insufficiency 08/24/2019   Insomnia due to mental condition 05/31/2019   Bipolar I disorder, most recent episode depressed (HCC) 05/31/2019   Alcohol use disorder, moderate, in sustained remission (HCC) 05/31/2019   Elevated tumor markers 05/27/2019   GAD (generalized anxiety disorder) 04/10/2019   MDD (major depressive disorder), severe (HCC) 01/31/2019   High serum carbohydrate antigen 19-9 (CA19-9) 12/01/2018   Adjustment disorder with anxious mood 11/24/2018   Essential hypertension 11/24/2018   B12 deficiency 10/17/2018   Incisional hernia, without obstruction or gangrene 09/12/2018   Genetic testing 04/07/2018   Family history of breast cancer 03/24/2018   Family history of lung cancer 03/24/2018   Malignant neoplasm of upper-outer quadrant of right breast in female, estrogen receptor  positive (HCC) 03/17/2018   Malignant neoplasm of upper-inner quadrant of right breast in female, estrogen receptor positive (HCC) 03/17/2018   History of psychosis 02/07/2018   History of insomnia 02/07/2018    ONSET DATE: Pt diagnosed with PD in 2019  REFERRING DIAG:  G20 (ICD-10-CM) - Parkinson's disease      THERAPY DIAG:  Muscle weakness (generalized)  Difficulty in walking, not elsewhere classified  Other lack of coordination  Unsteadiness on feet  Pain in left hip  Rationale for Evaluation and Treatment: Rehabilitation  SUBJECTIVE: Pt with continued L hip pain. She reports no stumbles/falls, no other updates. Pt exhibits two bruises on L heel just distal to Achilles insertion. Pt unsure if she bumped  heel into anything. She reports she has not walked more at home and is unsure why she is not more active.  Pain: L hip pain Pt accompanied by: significant other self, brought to session by caregiver  PERTINENT HISTORY:  Pt is a pleasant 77 yo female who was diagnosed with PD in 2019. She is known to clinic and [facility], and was last seen for LSVT BIG Sept-Oct 2022. Pt with chronic pain and has become increasingly  sedentary as a result exacerbating mobility difficulties due to PD. Current concerns: Pt reports L and R hip pain and back pain. Pt was being seen by pain management clinic. Pt has had injections that provided relief at the time. Pt's spouse reports her physician determined surgery was not an option for her pain so the plan is to manage it. Pt spouse thinks pain has resulted in reluctance for pt to move. Pt currently sedentary, doesn't walk much, but when she does she holds on to furniture for support due to balance problems. When pt first stands she must hold on to something due to mix of pain and/or dizziness (described as lightheadedness). Her pain can go from 3/10 to 9/10. It does improve once she starts moving. Pt has increased difficulty with multiple activities  including: getting in and out of her bed, getting in and out of her car, freezing, decreased gait speed, decreased balance and cognition, and difficulty using BUE due to tremors.  Per chart other PMH includes hx of breast cancer, HTN, MCI, insomnia, cataracts, bipolar 1, alcohol use disorder, GAD, MDD, osteopenia of neck of R femur, chronic B low back pain with radiating pain to buttock and posterior thighs and calves. Pt with multiple comorbidities, please refer to chart for full details    PAIN: from eval Are you having pain? Yes: NPRS scale: not rated currently but can reach 9/10 Pain location: L hip and L leg Pain description: pt has difficulty describing Aggravating factors: unsure Relieving factors: laying in the recliner helps, movement  PRECAUTIONS: Fall  WEIGHT BEARING RESTRICTIONS: No  FALLS: Has patient fallen in last 6 months? Yes. Number of falls 1x, leg tangled in chair leg when getting up from a chair  PLOF: Independent with basic ADLs  PATIENT GOALS: improve mobility   BLE Strength Assessment      Right  Left  Hip Flexion 5/5 4/5 (painful)  Hip Horizontal ABDCT  5/5 5/5  Hip Horizontal ADD 4+/5 4+/5  Hip IR (seated)  4+/5 4+/5  Hip ER (seated) 5/5 5/5  Knee Extension 5/5 5/5  Knee Flexion (seated) 4+/5  5/5 (not painful)  Hip ABDCT (supine)  3+/5 4-/5   Focal popliteal edema Left knee, not painful   ROM Assessment Hip     Right  Left   Flexion Supine 132 120 (ouch)  External rotation (90/90) 30 47  Internal Rotation (90/90) 40  20  Supine Hip Extension P/ROM >0 degrees >0 degrees       IMAGING: via chart   07/24/22 DG BONE density " ASSESSMENT: The probability of a major osteoporotic fracture is 15.8% within the next ten years.   The probability of a hip fracture is 2.9% within the next ten years."   12/22/21 DG lumbar spine " IMPRESSION: 1. No acute displaced fracture or traumatic listhesis of the lumbar spine. 2. Interval development of grade 1  anterolisthesis of L3 on L4. Stable grade 1 anterolisthesis of L4 on L5. Appears stable on flexion extension views. 3.  Aortic Atherosclerosis (ICD10-I70.0)."   New from chart:  DG Lumbar spine 01/05/23: "IMPRESSION: 1. Lumbar spine scoliosis concave right. Diffuse multilevel degenerative change. Disc degeneration most prominent at L2-L3 and L5-S1. 5 mm anterolisthesis L4 on L5. Diffuse severe facet hypertrophy. No flexion or extension abnormality identified. No evidence of fracture. 2. Sclerotic densities are noted in the L2 and L4 vertebral bodies. Although these could represent bone islands blastic metastatic disease cannot be excluded. MRI of the lumbar spine suggested  for further evaluation. 3. Large amount of stool noted throughout the colon. 4. Aortoiliac atherosclerotic vascular disease."    DG hip 01/05/23: "IMPRESSION: Moderate bilateral femoroacetabular osteoarthritis, similar to prior."   PATIENT EDUCATION:  Education details: Pt educated throughout session about proper posture and technique with exercises. Improved exercise technique, movement at target joints, use of target muscles after min to mod verbal, visual, tactile cues.  Person educated: Patient and Spouse Education method: Explanation, Demonstration, and Verbal cues Education comprehension: verbalized understanding, returned demonstration, verbal cues required, tactile cues required, and needs further education   TODAY'S TREATMENT:                                                                                                                                         DATE: 06/01/23   Gait belt donned and CGA provided throughout unless specified otherwise  Circuit training incorporates both TE and NMR  Circuit 1: Ambulation for endurance with 4WW and 4# AW each LE - 1 x 444 ft.  Large amplitude movement:  Seated FWD reach to floor>reach above head>bilat shoulder abduction (90 deg) 10x - pt reports fatiguing   STS with overhead press (3# bar) 10x   Circuit 2: Ambulation for endurance with 4WW  1 x 296 ft. Pt requests rest break LTL step and reach 6x each way. Pt reports intervention is fatiguing Large amplitude movement:  Seated FWD reach to floor>reach above head>bilat shoulder abduction (90 deg) 15x Seated reach and twist 10x each way STS with overhead press (3# bar) 10x   Circuit 3: Ambulation for endurance with 4WW, 1 x 296 ft (one, brief, seated rest break during set) LAQ 20x each LE Seated LTL step 20x each LE  STS 12x  PT reviews with pt importance of increasing amount of time she is ambulating at home in order to continue to make gains. Pt verbalizes understanding but reports she is unsure why she doesn't ambulate more.  Pt with no increased pain with interventions   HOME EXERCISE PROGRAM: pt to continue HEP as previously given  Access Code: [email or web address] URL: [email or web address] Date: 12/22/2022 Prepared by: Temple Pacini  Exercises - Sit to Stand with Counter Support  - 1 x daily - 5 x weekly - 3 sets - 10 reps  GOALS: Goals reviewed with patient? Yes  SHORT TERM GOALS: Target date: 04/27/2023    Patient will be independent in home exercise program to improve strength/mobility for better functional independence with ADLs. Baseline: LSVT BIG protocol to be initiated next session; 12/18: not yet fully indep, reports she still gets confused about the technique; 12/03/2022: HEP to be updated; Pt reports practicing HEP 2x/week; 02/23/23: not currently performing HEP, reviewed in session and provided new copy; 03/16/2023: pt reports performing HEP every day; 03/30/23: Pt reports she is not being consistent with her HEP; 7/1: pt reports she is not doing much at home Goal status:  MET (previously MET)   LONG TERM GOALS: Target date: 06/08/2023     Patient will increase FOTO score to equal to or greater than 49 to demonstrate statistically significant improvement in  mobility and quality of life.  Baseline: 40; 12/18: deferred due to time; 45; 3/19: deferred to next visit; 01/21/2023; 02/23/2023: 46 unchanged from previous assessment); 03/17/2023: 48 Goal status: PARTIALLY MET  2.  Patient (> 32 years old) will complete five times sit to stand test in < 15 seconds indicating decreased fall risk.  Baseline: 27 sec; 12/18 23 sec hands-free; 11/04/22: 24 sec hands free; 2/1/124: 22.7 sec use of BUE, 21.3 sec hands-free; 01/19/23: 18 seconds hands-free; 02/23/23: 19 seconds hands-free; 03/16/2023: 18 seconds hands-free; 03/30/23: 17.3 seconds hands-free; 05/03/23: 17.3 seconds hands-free  Goal status: IN PROGRESS  3.  Patient will increase 10 meter walk test to >1.68m/s with WFL B step-length, upright posture for >90% of test to improve ease with gait and to override hypokentic and bradykinetic movement.  Baseline: 0.58 m/s with 1YN; 12/18: 0.7 m/s with 8GN; 11/04/22: 0.69 m/s with 5AO;  12/03/22: 0.73 m/s with 1HY; 01/19/2023: 0.77 m/s with 8MV; 02/23/2023: 0.83 m/s with 7QI; 03/16/2023: 0.83 m/s with 6NG , pt still somewhat crouched, requires cues for upright posture >50% of time; 03/30/23: 0.83 m/s with crouched posture; 05/03/23 0.8 m/s with RW, some improvement with upright posture  Goal status: IN PROGRESS  4.  Patient will reduce time to don jacket by at least 5 seconds in order to increase ease with dressing and override bradykinetic movement Baseline: 17 seconds; 12/18: 12 seconds Goal status: MET  5.  Pt will demo or report ability to open standard jar using only 1-2 attempts in order to exhibit improved functional mobility for ADLs.  Baseline: 5.7 seconds with multiple attempts to turn jar lid to open; 12/18: 2.8 sec opens in 2 turns Goal status: MET  6.  Patient will increase Berg Balance score by > 6 points to demonstrate decreased fall risk during functional activities. Baseline: to be tested next 1-2 visits; 12/03/2022: 44/56; 01/21/23 45/56; 48/56; 03/17/2023: 48/56;  05/03/23: 50/56 Goal status: Partially met   7.  Patient will report at least 4 days in a row with minimal to no L hip pain in order to improve QOL and ease/safety with functional mobility. Baseline: Pt reports pain majority of days in L hip; 12/03/2022: pt reports having hip pain about every day; 3/19: rates hip pain daily (being seen now by MD, new imaging); 02/23/2023: pt went for period of time without hip pain (she thinks a week), but reports it has now come back,; 5/28: pt reports hip pain "every other day"; 05/03/23: pt reports pain in hip every day in past week Goal status: NOT MET/discontinued   8. Patient will increase six minute walk test distance to >1000 for progression to community ambulator and improve gait ability Baseline:02/23/2023: 722 ft with 4WW and two standing rest breaks, rates test as hard; 03/16/2023: deferred to next visit due to time; 5/28/20204: 641 ft with 4WW, pt requests to end test early due to fatigue at 5 min 35 sec; 05/03/23: 645 ft with 4WW and able to complete full test  Goal status: IN PROGRESS  ASSESSMENT:  CLINICAL IMPRESSION: Continued circuit training to promote LE strength, endurance and improved cardio/functional capacity. PT reviewed with pt importance of activity at home/ambulating more in order to make gains as pt continues to fatigue quickly, and she reports not being very active at home.  Pt cognition likely a factor limiting following PT instructions and ambulating more at home. The pt will benefit from further skilled PT to improve strength, balance, gait and mobility.  OBJECTIVE IMPAIRMENTS: Abnormal gait, decreased balance, decreased cognition, decreased coordination, decreased endurance, decreased knowledge of use of DME, decreased mobility, difficulty walking, decreased strength, dizziness, hypomobility, impaired UE functional use, improper body mechanics, postural dysfunction, and pain.   ACTIVITY LIMITATIONS: lifting, bending, standing, squatting,  stairs, transfers, bed mobility, bathing, dressing, hygiene/grooming, and locomotion level  PARTICIPATION LIMITATIONS: meal prep, cleaning, laundry, medication management, personal finances, driving, shopping, community activity, and yard work  PERSONAL FACTORS: Age, Fitness, Sex, Time since onset of injury/illness/exacerbation, and 3+ comorbidities: PMH includes hx of breast cancer, HTN, MCI, insomnia, cataracts, bipolar 1, alcohol use disorder, GAD, MDD, osteopenia of neck of R femur, chronic B low back pain with radiating pain to buttock and posterior thighs and calves. Pt with multiple comorbidities, please refer to chart for full details  are also affecting patient's functional outcome.   REHAB POTENTIAL: Fair    CLINICAL DECISION MAKING: Evolving/moderate complexity  EVALUATION COMPLEXITY: High  PLAN:  PT FREQUENCY: 2x/week  PT DURATION: 12 weeks  PLANNED INTERVENTIONS: Therapeutic exercises, Therapeutic activity, Neuromuscular re-education, Balance training, Gait training, Patient/Family education, Self Care, Joint mobilization, Stair training, Vestibular training, Canalith repositioning, Visual/preceptual remediation/compensation, Orthotic/Fit training, DME instructions, Wheelchair mobility training, Spinal mobilization, Cryotherapy, Moist heat, Splintting, Taping, and Manual therapy  PLAN FOR NEXT SESSION: hamtrings loading, hip strength, mobility, endurance, gait and balance, cardio, bed mobility   1:37 PM, 06/01/23    Baird Kay, PT 06/01/2023, 1:37 PM  Physical Therapist - [facility]  Outpatient Physical Therapy- Main Campus [phone]

## 2023-06-03 ENCOUNTER — Ambulatory Visit: Payer: Medicare PPO | Attending: Family Medicine

## 2023-06-03 DIAGNOSIS — R262 Difficulty in walking, not elsewhere classified: Secondary | ICD-10-CM | POA: Insufficient documentation

## 2023-06-03 DIAGNOSIS — M25552 Pain in left hip: Secondary | ICD-10-CM | POA: Insufficient documentation

## 2023-06-03 DIAGNOSIS — R42 Dizziness and giddiness: Secondary | ICD-10-CM | POA: Diagnosis not present

## 2023-06-03 DIAGNOSIS — R2681 Unsteadiness on feet: Secondary | ICD-10-CM | POA: Insufficient documentation

## 2023-06-03 DIAGNOSIS — M6281 Muscle weakness (generalized): Secondary | ICD-10-CM | POA: Insufficient documentation

## 2023-06-03 DIAGNOSIS — R278 Other lack of coordination: Secondary | ICD-10-CM | POA: Insufficient documentation

## 2023-06-03 NOTE — Therapy (Signed)
OUTPATIENT NEURO PHYSICAL THERAPY TREATMENT    Patient Name: Kendra [name] MRN: [id number] DOB:1946-01-26, 77 y.o., female Today's Date: 06/03/2023  PCP: Erasmo Downer, MD REFERRING PROVIDER: Erasmo Downer, MD  END OF SESSION:  PT End of Session - 06/03/23 1315     Visit Number 58    Number of Visits 60    Date for PT Re-Evaluation 06/08/23    Authorization Type [employer] PPO    Authorization Time Period 11/04/2022-12/30/2022    Progress Note Due on Visit 30    PT Start Time 1316    PT Stop Time 1358    PT Time Calculation (min) 42 min    Equipment Utilized During Treatment Gait belt    Activity Tolerance Patient tolerated treatment well;Patient limited by fatigue    Behavior During Therapy WFL for tasks assessed/performed                             Past Medical History:  Diagnosis Date   Anxiety    Breast cancer (HCC) 03/2018   right breast cancer at 11:00 and 1:00   Bursitis    bilateral hips and knees   Complication of anesthesia    Depression    Family history of breast cancer 03/24/2018   Family history of lung cancer 03/24/2018   History of alcohol dependence (HCC) 2007   no ETOH; resolved since 2007   History of hiatal hernia    History of psychosis 2014   due to sleep disturbance   Hypertension    Parkinson disease    Personal history of radiation therapy 06/2018-07/2018   right breast ca   PONV (postoperative nausea and vomiting)    Past Surgical History:  Procedure Laterality Date   BREAST BIOPSY Right 03/14/2018   11:00 DCIS and invasive ductal carcinoma   BREAST BIOPSY Right 03/14/2018   1:00 Invasive ductal carcinoma   BREAST LUMPECTOMY Right 04/27/2018   lumpectomy of 11 and 1:00 cancers, clear margins, negative LN   BREAST LUMPECTOMY WITH RADIOACTIVE SEED AND SENTINEL LYMPH NODE BIOPSY Right 04/27/2018   Procedure: RIGHT BREAST LUMPECTOMY WITH RADIOACTIVE SEED X 2 AND RIGHT SENTINEL LYMPH NODE BIOPSY;   Surgeon: Glenna Fellows, MD;  Location: [facility];  Service: General;  Laterality: Right;   CATARACT EXTRACTION W/PHACO Left 08/30/2019   Procedure: CATARACT EXTRACTION PHACO AND INTRAOCULAR LENS PLACEMENT (IOC) LEFT panoptix toric  01:20.5  12.7%  10.26;  Surgeon: Lockie Mola, MD;  Location: [facility];  Service: Ophthalmology;  Laterality: Left;   CATARACT EXTRACTION W/PHACO Right 09/20/2019   Procedure: CATARACT EXTRACTION PHACO AND INTRAOCULAR LENS PLACEMENT (IOC) RIGHT 5.71  00:36.4  15.7%;  Surgeon: Lockie Mola, MD;  Location: [facility];  Service: Ophthalmology;  Laterality: Right;   LAPAROTOMY N/A 02/10/2018   Procedure: EXPLORATORY LAPAROTOMY;  Surgeon: Shonna Chock, MD;  Location: [facility];  Service: Gynecology;  Laterality: N/A;   MASS EXCISION  01/2018   abdominal   OVARIAN CYST REMOVAL     SALPINGOOPHORECTOMY Bilateral 02/10/2018   Procedure: BILATERAL SALPINGO OOPHORECTOMY; PERITONEAL WASHINGS;  Surgeon: Shonna Chock, MD;  Location: [facility];  Service: Gynecology;  Laterality: Bilateral;   TONSILLECTOMY     TONSILLECTOMY AND ADENOIDECTOMY  1953   Patient Active Problem List   Diagnosis Date Noted   Neurogenic bladder 05/14/2023   Prediabetes 12/15/2022   Chronic cough 08/17/2022   History of allergy to shellfish 02/24/2022  Chronic low back pain of over 3 months duration 02/04/2022   Osteoarthritis of  AC (acromioclavicular) joint (Left) 02/04/2022   Osteoarthritis of hip (Right) 02/04/2022   Impaction fracture of distal end of radius and ulna, sequela (Left) 02/04/2022   Osteoarthritis of 1st (thumb) carpometacarpal (CMC) joint of hand (Left) 02/04/2022   Lumbosacral facet arthropathy (Multilevel) (Bilateral) (T12-S1) 02/04/2022   Lumbar central spinal stenosis w/o neurogenic claudication (L2-3, L3-4, L4-5) 02/04/2022   Lumbar foraminal stenosis (Bilateral: L2-3, L4-5) (Right: (Severe) L4-5) 02/04/2022   Lumbar  lateral recess stenosis (Bilateral: L2-3, L3-4, and L4-5) (Severe) 02/04/2022   DDD (degenerative disc disease), lumbar 02/04/2022   Lumbosacral facet syndrome (Multilevel) (Bilateral) 02/04/2022   Osteoarthritis of lumbar spine 02/04/2022   Allergic conjunctivitis of both eyes 12/23/2021   Chronic hip pain (3ry area of Pain) (Bilateral) (L>R) 12/22/2021   Chronic lower extremity pain (2ry area of Pain) (Bilateral) (L>R) 12/22/2021   Abnormal MRI, lumbar spine (01/31/2020) 12/22/2021   Grade 1 Anterolisthesis of lumbar spine (L3/L4) & (5 mm) (L4/L5) 12/22/2021   Chronic pain syndrome 11/17/2021   High risk medication use 11/17/2021   Disorder of skeletal system 11/17/2021   Problems influencing health status 11/17/2021   Other hyperlipidemia 10/21/2021   Polypharmacy 08/12/2021   Chronic low back pain (1ry area of Pain) (Bilateral) (L>R) w/o sciatica 06/18/2021   Osteopenia of neck of femur (Right) 09/06/2020   Obesity 08/29/2020   Parkinson disease 02/22/2020   Mucinous cystadenoma 12/24/2019   Bipolar disorder, in full remission, most recent episode depressed (HCC) 11/08/2019   Vitamin D insufficiency 08/24/2019   Insomnia due to mental condition 05/31/2019   Bipolar I disorder, most recent episode depressed (HCC) 05/31/2019   Alcohol use disorder, moderate, in sustained remission (HCC) 05/31/2019   Elevated tumor markers 05/27/2019   GAD (generalized anxiety disorder) 04/10/2019   MDD (major depressive disorder), severe (HCC) 01/31/2019   High serum carbohydrate antigen 19-9 (CA19-9) 12/01/2018   Adjustment disorder with anxious mood 11/24/2018   Essential hypertension 11/24/2018   B12 deficiency 10/17/2018   Incisional hernia, without obstruction or gangrene 09/12/2018   Genetic testing 04/07/2018   Family history of breast cancer 03/24/2018   Family history of lung cancer 03/24/2018   Malignant neoplasm of upper-outer quadrant of right breast in female, estrogen receptor  positive (HCC) 03/17/2018   Malignant neoplasm of upper-inner quadrant of right breast in female, estrogen receptor positive (HCC) 03/17/2018   History of psychosis 02/07/2018   History of insomnia 02/07/2018    ONSET DATE: Pt diagnosed with PD in 2019  REFERRING DIAG:  G20 (ICD-10-CM) - Parkinson's disease      THERAPY DIAG:  Muscle weakness (generalized)  Difficulty in walking, not elsewhere classified  Other lack of coordination  Unsteadiness on feet  Rationale for Evaluation and Treatment: Rehabilitation  SUBJECTIVE: Pt reports her hip is feeling OK at the moment. She was brought by caregiver today. Pt is looking forward to seeing her friends this weekend. She reports no stumbles/falls, no other updates.  Pain: none currently Pt accompanied by: significant other self, brought to session by caregiver  PERTINENT HISTORY:  Pt is a pleasant 77 yo female who was diagnosed with PD in 2019. She is known to clinic and [facility], and was last seen for LSVT BIG Sept-Oct 2022. Pt with chronic pain and has become increasingly sedentary as a result exacerbating mobility difficulties due to PD. Current concerns: Pt reports L and R hip pain and back pain. Pt was being  seen by pain management clinic. Pt has had injections that provided relief at the time. Pt's spouse reports her physician determined surgery was not an option for her pain so the plan is to manage it. Pt spouse thinks pain has resulted in reluctance for pt to move. Pt currently sedentary, doesn't walk much, but when she does she holds on to furniture for support due to balance problems. When pt first stands she must hold on to something due to mix of pain and/or dizziness (described as lightheadedness). Her pain can go from 3/10 to 9/10. It does improve once she starts moving. Pt has increased difficulty with multiple activities including: getting in and out of her bed, getting in and out of her car, freezing, decreased gait speed,  decreased balance and cognition, and difficulty using BUE due to tremors.  Per chart other PMH includes hx of breast cancer, HTN, MCI, insomnia, cataracts, bipolar 1, alcohol use disorder, GAD, MDD, osteopenia of neck of R femur, chronic B low back pain with radiating pain to buttock and posterior thighs and calves. Pt with multiple comorbidities, please refer to chart for full details    PAIN: from eval Are you having pain? Yes: NPRS scale: not rated currently but can reach 9/10 Pain location: L hip and L leg Pain description: pt has difficulty describing Aggravating factors: unsure Relieving factors: laying in the recliner helps, movement  PRECAUTIONS: Fall  WEIGHT BEARING RESTRICTIONS: No  FALLS: Has patient fallen in last 6 months? Yes. Number of falls 1x, leg tangled in chair leg when getting up from a chair  PLOF: Independent with basic ADLs  PATIENT GOALS: improve mobility   BLE Strength Assessment      Right  Left  Hip Flexion 5/5 4/5 (painful)  Hip Horizontal ABDCT  5/5 5/5  Hip Horizontal ADD 4+/5 4+/5  Hip IR (seated)  4+/5 4+/5  Hip ER (seated) 5/5 5/5  Knee Extension 5/5 5/5  Knee Flexion (seated) 4+/5  5/5 (not painful)  Hip ABDCT (supine)  3+/5 4-/5   Focal popliteal edema Left knee, not painful   ROM Assessment Hip     Right  Left   Flexion Supine 132 120 (ouch)  External rotation (90/90) 30 47  Internal Rotation (90/90) 40  20  Supine Hip Extension P/ROM >0 degrees >0 degrees       IMAGING: via chart   07/24/22 DG BONE density " ASSESSMENT: The probability of a major osteoporotic fracture is 15.8% within the next ten years.   The probability of a hip fracture is 2.9% within the next ten years."   12/22/21 DG lumbar spine " IMPRESSION: 1. No acute displaced fracture or traumatic listhesis of the lumbar spine. 2. Interval development of grade 1 anterolisthesis of L3 on L4. Stable grade 1 anterolisthesis of L4 on L5. Appears stable on flexion  extension views. 3.  Aortic Atherosclerosis (ICD10-I70.0)."   New from chart:  DG Lumbar spine 01/05/23: "IMPRESSION: 1. Lumbar spine scoliosis concave right. Diffuse multilevel degenerative change. Disc degeneration most prominent at L2-L3 and L5-S1. 5 mm anterolisthesis L4 on L5. Diffuse severe facet hypertrophy. No flexion or extension abnormality identified. No evidence of fracture. 2. Sclerotic densities are noted in the L2 and L4 vertebral bodies. Although these could represent bone islands blastic metastatic disease cannot be excluded. MRI of the lumbar spine suggested for further evaluation. 3. Large amount of stool noted throughout the colon. 4. Aortoiliac atherosclerotic vascular disease."    DG hip 01/05/23: "IMPRESSION: Moderate  bilateral femoroacetabular osteoarthritis, similar to prior."   PATIENT EDUCATION:  Education details: Pt educated throughout session about proper posture and technique with exercises. Improved exercise technique, movement at target joints, use of target muscles after min to mod verbal, visual, tactile cues.  Person educated: Patient and Spouse Education method: Explanation, Demonstration, and Verbal cues Education comprehension: verbalized understanding, returned demonstration, verbal cues required, tactile cues required, and needs further education   TODAY'S TREATMENT:                                                                                                                                         DATE: 06/03/23   Gait belt donned and CGA provided throughout unless specified otherwise  Circuit training incorporates both TE and NMR  Circuit 1: Ambulation for endurance with 4WW and 5# AW each LE - 1 x 444 ft.  Large amplitude movement:  Seated FWD reach to floor>reach above head>bilat shoulder abduction (90 deg) 10x with 5 sec hold/rep - challenging Step forward reach forward with UUE support 10x each side Large amplitude STS with BUE  shoulder abduction 12x  Circuit 2: Ambulation for endurance with 4WW and 5# AWs  1 x 444 ft.  Seated march 20x alt LE. Pt rates medium + Large amplitude STS with BUE shoulder abduction 15x Seated reach and twist 12x each direction  Circuit 3: Ambulation for endurance with 4WW, 1 x 444 ft. Pt requests rest break LAQ 15x each LE Large amplitude STS with BUE shoulder abduction 15x  Pt with no increased pain with interventions   HOME EXERCISE PROGRAM: pt to continue HEP as previously given  Access Code: [email or web address] URL: [email or web address] Date: 12/22/2022 Prepared by: Temple Pacini  Exercises - Sit to Stand with Counter Support  - 1 x daily - 5 x weekly - 3 sets - 10 reps  GOALS: Goals reviewed with patient? Yes  SHORT TERM GOALS: Target date: 04/27/2023    Patient will be independent in home exercise program to improve strength/mobility for better functional independence with ADLs. Baseline: LSVT BIG protocol to be initiated next session; 12/18: not yet fully indep, reports she still gets confused about the technique; 12/03/2022: HEP to be updated; Pt reports practicing HEP 2x/week; 02/23/23: not currently performing HEP, reviewed in session and provided new copy; 03/16/2023: pt reports performing HEP every day; 03/30/23: Pt reports she is not being consistent with her HEP; 7/1: pt reports she is not doing much at home Goal status: MET (previously MET)   LONG TERM GOALS: Target date: 06/08/2023     Patient will increase FOTO score to equal to or greater than 49 to demonstrate statistically significant improvement in mobility and quality of life.  Baseline: 40; 12/18: deferred due to time; 45; 3/19: deferred to next visit; 01/21/2023; 02/23/2023: 46 unchanged from previous assessment); 03/17/2023: 48 Goal status: PARTIALLY MET  2.  Patient (> 33 years old)  will complete five times sit to stand test in < 15 seconds indicating decreased fall risk.  Baseline: 27 sec; 12/18  23 sec hands-free; 11/04/22: 24 sec hands free; 2/1/124: 22.7 sec use of BUE, 21.3 sec hands-free; 01/19/23: 18 seconds hands-free; 02/23/23: 19 seconds hands-free; 03/16/2023: 18 seconds hands-free; 03/30/23: 17.3 seconds hands-free; 05/03/23: 17.3 seconds hands-free  Goal status: IN PROGRESS  3.  Patient will increase 10 meter walk test to >1.65m/s with WFL B step-length, upright posture for >90% of test to improve ease with gait and to override hypokentic and bradykinetic movement.  Baseline: 0.58 m/s with 4UJ; 12/18: 0.7 m/s with 8JX; 11/04/22: 0.69 m/s with 9JY;  12/03/22: 0.73 m/s with 7WG; 01/19/2023: 0.77 m/s with 9FA; 02/23/2023: 0.83 m/s with 2ZH; 03/16/2023: 0.83 m/s with 0QM , pt still somewhat crouched, requires cues for upright posture >50% of time; 03/30/23: 0.83 m/s with crouched posture; 05/03/23 0.8 m/s with RW, some improvement with upright posture  Goal status: IN PROGRESS  4.  Patient will reduce time to don jacket by at least 5 seconds in order to increase ease with dressing and override bradykinetic movement Baseline: 17 seconds; 12/18: 12 seconds Goal status: MET  5.  Pt will demo or report ability to open standard jar using only 1-2 attempts in order to exhibit improved functional mobility for ADLs.  Baseline: 5.7 seconds with multiple attempts to turn jar lid to open; 12/18: 2.8 sec opens in 2 turns Goal status: MET  6.  Patient will increase Berg Balance score by > 6 points to demonstrate decreased fall risk during functional activities. Baseline: to be tested next 1-2 visits; 12/03/2022: 44/56; 01/21/23 45/56; 48/56; 03/17/2023: 48/56; 05/03/23: 50/56 Goal status: Partially met   7.  Patient will report at least 4 days in a row with minimal to no L hip pain in order to improve QOL and ease/safety with functional mobility. Baseline: Pt reports pain majority of days in L hip; 12/03/2022: pt reports having hip pain about every day; 3/19: rates hip pain daily (being seen now by MD, new imaging);  02/23/2023: pt went for period of time without hip pain (she thinks a week), but reports it has now come back,; 5/28: pt reports hip pain "every other day"; 05/03/23: pt reports pain in hip every day in past week Goal status: NOT MET/discontinued   8. Patient will increase six minute walk test distance to >1000 for progression to community ambulator and improve gait ability Baseline:02/23/2023: 722 ft with 4WW and two standing rest breaks, rates test as hard; 03/16/2023: deferred to next visit due to time; 5/28/20204: 641 ft with 4WW, pt requests to end test early due to fatigue at 5 min 35 sec; 05/03/23: 645 ft with 4WW and able to complete full test  Goal status: IN PROGRESS  ASSESSMENT:  CLINICAL IMPRESSION: Pt able to complete all interventions without hip pain today. She still struggles to increase total distance ambulated due to fatigue and is quick to ask for rest breaks. PT continues to provide encouragement throughout for further participation in interventions. The pt will benefit from further skilled PT to improve strength, balance, gait and mobility.  OBJECTIVE IMPAIRMENTS: Abnormal gait, decreased balance, decreased cognition, decreased coordination, decreased endurance, decreased knowledge of use of DME, decreased mobility, difficulty walking, decreased strength, dizziness, hypomobility, impaired UE functional use, improper body mechanics, postural dysfunction, and pain.   ACTIVITY LIMITATIONS: lifting, bending, standing, squatting, stairs, transfers, bed mobility, bathing, dressing, hygiene/grooming, and locomotion level  PARTICIPATION LIMITATIONS: meal  prep, cleaning, laundry, medication management, personal finances, driving, shopping, community activity, and yard work  PERSONAL FACTORS: Age, Fitness, Sex, Time since onset of injury/illness/exacerbation, and 3+ comorbidities: PMH includes hx of breast cancer, HTN, MCI, insomnia, cataracts, bipolar 1, alcohol use disorder, GAD, MDD,  osteopenia of neck of R femur, chronic B low back pain with radiating pain to buttock and posterior thighs and calves. Pt with multiple comorbidities, please refer to chart for full details  are also affecting patient's functional outcome.   REHAB POTENTIAL: Fair    CLINICAL DECISION MAKING: Evolving/moderate complexity  EVALUATION COMPLEXITY: High  PLAN:  PT FREQUENCY: 2x/week  PT DURATION: 12 weeks  PLANNED INTERVENTIONS: Therapeutic exercises, Therapeutic activity, Neuromuscular re-education, Balance training, Gait training, Patient/Family education, Self Care, Joint mobilization, Stair training, Vestibular training, Canalith repositioning, Visual/preceptual remediation/compensation, Orthotic/Fit training, DME instructions, Wheelchair mobility training, Spinal mobilization, Cryotherapy, Moist heat, Splintting, Taping, and Manual therapy  PLAN FOR NEXT SESSION: hamtrings loading, hip strength, mobility, endurance, gait and balance, cardio, bed mobility   5:27 PM, 06/03/23    Baird Kay, PT 06/03/2023, 5:27 PM  Physical Therapist - [facility]  Outpatient Physical Therapy- Main Campus [phone]

## 2023-06-08 ENCOUNTER — Ambulatory Visit: Payer: Medicare PPO

## 2023-06-08 DIAGNOSIS — R278 Other lack of coordination: Secondary | ICD-10-CM | POA: Diagnosis not present

## 2023-06-08 DIAGNOSIS — R2681 Unsteadiness on feet: Secondary | ICD-10-CM

## 2023-06-08 DIAGNOSIS — R42 Dizziness and giddiness: Secondary | ICD-10-CM | POA: Diagnosis not present

## 2023-06-08 DIAGNOSIS — R262 Difficulty in walking, not elsewhere classified: Secondary | ICD-10-CM | POA: Diagnosis not present

## 2023-06-08 DIAGNOSIS — M6281 Muscle weakness (generalized): Secondary | ICD-10-CM

## 2023-06-08 DIAGNOSIS — M25552 Pain in left hip: Secondary | ICD-10-CM | POA: Diagnosis not present

## 2023-06-08 NOTE — Therapy (Signed)
OUTPATIENT NEURO PHYSICAL THERAPY TREATMENT    Patient Name: Kendra [name] MRN: [id number] DOB:04/10/1946, 77 y.o., female Today's Date: 06/08/2023  PCP: Erasmo Downer, MD REFERRING PROVIDER: Erasmo Downer, MD  END OF SESSION:  PT End of Session - 06/08/23 1300     Visit Number 59    Number of Visits 60    Date for PT Re-Evaluation 06/08/23    Authorization Type [employer] PPO    Authorization Time Period 11/04/2022-12/30/2022    Progress Note Due on Visit 30    PT Start Time 1317    PT Stop Time 1358    PT Time Calculation (min) 41 min    Equipment Utilized During Treatment Gait belt    Activity Tolerance Patient tolerated treatment well;Patient limited by fatigue    Behavior During Therapy WFL for tasks assessed/performed                             Past Medical History:  Diagnosis Date   Anxiety    Breast cancer (HCC) 03/2018   right breast cancer at 11:00 and 1:00   Bursitis    bilateral hips and knees   Complication of anesthesia    Depression    Family history of breast cancer 03/24/2018   Family history of lung cancer 03/24/2018   History of alcohol dependence (HCC) 2007   no ETOH; resolved since 2007   History of hiatal hernia    History of psychosis 2014   due to sleep disturbance   Hypertension    Parkinson disease    Personal history of radiation therapy 06/2018-07/2018   right breast ca   PONV (postoperative nausea and vomiting)    Past Surgical History:  Procedure Laterality Date   BREAST BIOPSY Right 03/14/2018   11:00 DCIS and invasive ductal carcinoma   BREAST BIOPSY Right 03/14/2018   1:00 Invasive ductal carcinoma   BREAST LUMPECTOMY Right 04/27/2018   lumpectomy of 11 and 1:00 cancers, clear margins, negative LN   BREAST LUMPECTOMY WITH RADIOACTIVE SEED AND SENTINEL LYMPH NODE BIOPSY Right 04/27/2018   Procedure: RIGHT BREAST LUMPECTOMY WITH RADIOACTIVE SEED X 2 AND RIGHT SENTINEL LYMPH NODE BIOPSY;   Surgeon: Glenna Fellows, MD;  Location: [facility];  Service: General;  Laterality: Right;   CATARACT EXTRACTION W/PHACO Left 08/30/2019   Procedure: CATARACT EXTRACTION PHACO AND INTRAOCULAR LENS PLACEMENT (IOC) LEFT panoptix toric  01:20.5  12.7%  10.26;  Surgeon: Lockie Mola, MD;  Location: [facility];  Service: Ophthalmology;  Laterality: Left;   CATARACT EXTRACTION W/PHACO Right 09/20/2019   Procedure: CATARACT EXTRACTION PHACO AND INTRAOCULAR LENS PLACEMENT (IOC) RIGHT 5.71  00:36.4  15.7%;  Surgeon: Lockie Mola, MD;  Location: [facility];  Service: Ophthalmology;  Laterality: Right;   LAPAROTOMY N/A 02/10/2018   Procedure: EXPLORATORY LAPAROTOMY;  Surgeon: Shonna Chock, MD;  Location: [facility];  Service: Gynecology;  Laterality: N/A;   MASS EXCISION  01/2018   abdominal   OVARIAN CYST REMOVAL     SALPINGOOPHORECTOMY Bilateral 02/10/2018   Procedure: BILATERAL SALPINGO OOPHORECTOMY; PERITONEAL WASHINGS;  Surgeon: Shonna Chock, MD;  Location: [facility];  Service: Gynecology;  Laterality: Bilateral;   TONSILLECTOMY     TONSILLECTOMY AND ADENOIDECTOMY  1953   Patient Active Problem List   Diagnosis Date Noted   Neurogenic bladder 05/14/2023   Prediabetes 12/15/2022   Chronic cough 08/17/2022   History of allergy to shellfish 02/24/2022  Chronic low back pain of over 3 months duration 02/04/2022   Osteoarthritis of  AC (acromioclavicular) joint (Left) 02/04/2022   Osteoarthritis of hip (Right) 02/04/2022   Impaction fracture of distal end of radius and ulna, sequela (Left) 02/04/2022   Osteoarthritis of 1st (thumb) carpometacarpal (CMC) joint of hand (Left) 02/04/2022   Lumbosacral facet arthropathy (Multilevel) (Bilateral) (T12-S1) 02/04/2022   Lumbar central spinal stenosis w/o neurogenic claudication (L2-3, L3-4, L4-5) 02/04/2022   Lumbar foraminal stenosis (Bilateral: L2-3, L4-5) (Right: (Severe) L4-5) 02/04/2022   Lumbar  lateral recess stenosis (Bilateral: L2-3, L3-4, and L4-5) (Severe) 02/04/2022   DDD (degenerative disc disease), lumbar 02/04/2022   Lumbosacral facet syndrome (Multilevel) (Bilateral) 02/04/2022   Osteoarthritis of lumbar spine 02/04/2022   Allergic conjunctivitis of both eyes 12/23/2021   Chronic hip pain (3ry area of Pain) (Bilateral) (L>R) 12/22/2021   Chronic lower extremity pain (2ry area of Pain) (Bilateral) (L>R) 12/22/2021   Abnormal MRI, lumbar spine (01/31/2020) 12/22/2021   Grade 1 Anterolisthesis of lumbar spine (L3/L4) & (5 mm) (L4/L5) 12/22/2021   Chronic pain syndrome 11/17/2021   High risk medication use 11/17/2021   Disorder of skeletal system 11/17/2021   Problems influencing health status 11/17/2021   Other hyperlipidemia 10/21/2021   Polypharmacy 08/12/2021   Chronic low back pain (1ry area of Pain) (Bilateral) (L>R) w/o sciatica 06/18/2021   Osteopenia of neck of femur (Right) 09/06/2020   Obesity 08/29/2020   Parkinson disease 02/22/2020   Mucinous cystadenoma 12/24/2019   Bipolar disorder, in full remission, most recent episode depressed (HCC) 11/08/2019   Vitamin D insufficiency 08/24/2019   Insomnia due to mental condition 05/31/2019   Bipolar I disorder, most recent episode depressed (HCC) 05/31/2019   Alcohol use disorder, moderate, in sustained remission (HCC) 05/31/2019   Elevated tumor markers 05/27/2019   GAD (generalized anxiety disorder) 04/10/2019   MDD (major depressive disorder), severe (HCC) 01/31/2019   High serum carbohydrate antigen 19-9 (CA19-9) 12/01/2018   Adjustment disorder with anxious mood 11/24/2018   Essential hypertension 11/24/2018   B12 deficiency 10/17/2018   Incisional hernia, without obstruction or gangrene 09/12/2018   Genetic testing 04/07/2018   Family history of breast cancer 03/24/2018   Family history of lung cancer 03/24/2018   Malignant neoplasm of upper-outer quadrant of right breast in female, estrogen receptor  positive (HCC) 03/17/2018   Malignant neoplasm of upper-inner quadrant of right breast in female, estrogen receptor positive (HCC) 03/17/2018   History of psychosis 02/07/2018   History of insomnia 02/07/2018    ONSET DATE: Pt diagnosed with PD in 2019  REFERRING DIAG:  G20 (ICD-10-CM) - Parkinson's disease      THERAPY DIAG:  Difficulty in walking, not elsewhere classified  Muscle weakness (generalized)  Unsteadiness on feet  Other lack of coordination  Rationale for Evaluation and Treatment: Rehabilitation  SUBJECTIVE: Pt reports no updates today. Her hip is feeling good at the moment.  Pain: none currently Pt accompanied by: significant other self, brought to session by caregiver  PERTINENT HISTORY:  Pt is a pleasant 77 yo female who was diagnosed with PD in 2019. She is known to clinic and [facility], and was last seen for LSVT BIG Sept-Oct 2022. Pt with chronic pain and has become increasingly sedentary as a result exacerbating mobility difficulties due to PD. Current concerns: Pt reports L and R hip pain and back pain. Pt was being seen by pain management clinic. Pt has had injections that provided relief at the time. Pt's spouse reports her physician  determined surgery was not an option for her pain so the plan is to manage it. Pt spouse thinks pain has resulted in reluctance for pt to move. Pt currently sedentary, doesn't walk much, but when she does she holds on to furniture for support due to balance problems. When pt first stands she must hold on to something due to mix of pain and/or dizziness (described as lightheadedness). Her pain can go from 3/10 to 9/10. It does improve once she starts moving. Pt has increased difficulty with multiple activities including: getting in and out of her bed, getting in and out of her car, freezing, decreased gait speed, decreased balance and cognition, and difficulty using BUE due to tremors.  Per chart other PMH includes hx of breast cancer,  HTN, MCI, insomnia, cataracts, bipolar 1, alcohol use disorder, GAD, MDD, osteopenia of neck of R femur, chronic B low back pain with radiating pain to buttock and posterior thighs and calves. Pt with multiple comorbidities, please refer to chart for full details    PAIN: from eval Are you having pain? Yes: NPRS scale: not rated currently but can reach 9/10 Pain location: L hip and L leg Pain description: pt has difficulty describing Aggravating factors: unsure Relieving factors: laying in the recliner helps, movement  PRECAUTIONS: Fall  WEIGHT BEARING RESTRICTIONS: No  FALLS: Has patient fallen in last 6 months? Yes. Number of falls 1x, leg tangled in chair leg when getting up from a chair  PLOF: Independent with basic ADLs  PATIENT GOALS: improve mobility   BLE Strength Assessment      Right  Left  Hip Flexion 5/5 4/5 (painful)  Hip Horizontal ABDCT  5/5 5/5  Hip Horizontal ADD 4+/5 4+/5  Hip IR (seated)  4+/5 4+/5  Hip ER (seated) 5/5 5/5  Knee Extension 5/5 5/5  Knee Flexion (seated) 4+/5  5/5 (not painful)  Hip ABDCT (supine)  3+/5 4-/5   Focal popliteal edema Left knee, not painful   ROM Assessment Hip     Right  Left   Flexion Supine 132 120 (ouch)  External rotation (90/90) 30 47  Internal Rotation (90/90) 40  20  Supine Hip Extension P/ROM >0 degrees >0 degrees       IMAGING: via chart   07/24/22 DG BONE density " ASSESSMENT: The probability of a major osteoporotic fracture is 15.8% within the next ten years.   The probability of a hip fracture is 2.9% within the next ten years."   12/22/21 DG lumbar spine " IMPRESSION: 1. No acute displaced fracture or traumatic listhesis of the lumbar spine. 2. Interval development of grade 1 anterolisthesis of L3 on L4. Stable grade 1 anterolisthesis of L4 on L5. Appears stable on flexion extension views. 3.  Aortic Atherosclerosis (ICD10-I70.0)."   New from chart:  DG Lumbar spine 01/05/23: "IMPRESSION: 1.  Lumbar spine scoliosis concave right. Diffuse multilevel degenerative change. Disc degeneration most prominent at L2-L3 and L5-S1. 5 mm anterolisthesis L4 on L5. Diffuse severe facet hypertrophy. No flexion or extension abnormality identified. No evidence of fracture. 2. Sclerotic densities are noted in the L2 and L4 vertebral bodies. Although these could represent bone islands blastic metastatic disease cannot be excluded. MRI of the lumbar spine suggested for further evaluation. 3. Large amount of stool noted throughout the colon. 4. Aortoiliac atherosclerotic vascular disease."    DG hip 01/05/23: "IMPRESSION: Moderate bilateral femoroacetabular osteoarthritis, similar to prior."   PATIENT EDUCATION:  Education details: Pt educated throughout session about proper posture  and technique with exercises. Improved exercise technique, movement at target joints, use of target muscles after min to mod verbal, visual, tactile cues.  Person educated: Patient and Spouse Education method: Explanation, Demonstration, and Verbal cues Education comprehension: verbalized understanding, returned demonstration, verbal cues required, tactile cues required, and needs further education   TODAY'S TREATMENT:                                                                                                                                         DATE: 06/08/23   Gait belt donned and CGA provided throughout unless specified otherwise  Circuit training incorporates both TE and NMR  Gait for time/endurance for 6 min without stopping, using 4WW x approx 740 ft. Pt rates hard, requires cuing throughout for proximity to RW.  Gait speed intervention (completed over 10 meters) Trial 1: 16.5 sec Trial 2: 13 sec Trial 3: 13 sec Comments: improved speed with cuing for "big steps"    Circuit 1: Tandem stance 30 sec each LE position hands-free Large amplitude movement:  Seated FWD reach to floor>reach above  head>bilat shoulder abduction (90 deg) 10x with 5 sec hold/rep - pt reports intervention is fatiguing  Standing NBOS EC x30 sec Step forward reach forward with UUE support 10x each side Large amplitude STS with BUE shoulder abduction 10x   Circuit 2: Tandem stance 30 sec each LE position - intermittent UE support  Seated march 20x alt LE. Pt rates hard  Standing NBOS EC x 30 sec  Seated LAQ 10x each LE  Large amplitude STS with BUE shoulder abduction 10x  Circuit 3: Seated reach and twist 10x each direction LAQ 10x each LE Large amplitude STS with BUE shoulder abduction 10x  Pt with no increased pain with interventions   HOME EXERCISE PROGRAM: pt to continue HEP as previously given  Access Code: [email or web address] URL: [email or web address] Date: 12/22/2022 Prepared by: Temple Pacini  Exercises - Sit to Stand with Counter Support  - 1 x daily - 5 x weekly - 3 sets - 10 reps  GOALS: Goals reviewed with patient? Yes  SHORT TERM GOALS: Target date: 04/27/2023    Patient will be independent in home exercise program to improve strength/mobility for better functional independence with ADLs. Baseline: LSVT BIG protocol to be initiated next session; 12/18: not yet fully indep, reports she still gets confused about the technique; 12/03/2022: HEP to be updated; Pt reports practicing HEP 2x/week; 02/23/23: not currently performing HEP, reviewed in session and provided new copy; 03/16/2023: pt reports performing HEP every day; 03/30/23: Pt reports she is not being consistent with her HEP; 7/1: pt reports she is not doing much at home Goal status: MET (previously MET)   LONG TERM GOALS: Target date: 06/08/2023     Patient will increase FOTO score to equal to or greater than 49 to demonstrate statistically significant improvement in mobility and quality of  life.  Baseline: 40; 12/18: deferred due to time; 45; 3/19: deferred to next visit; 01/21/2023; 02/23/2023: 46 unchanged from previous  assessment); 03/17/2023: 48 Goal status: PARTIALLY MET  2.  Patient (> 64 years old) will complete five times sit to stand test in < 15 seconds indicating decreased fall risk.  Baseline: 27 sec; 12/18 23 sec hands-free; 11/04/22: 24 sec hands free; 2/1/124: 22.7 sec use of BUE, 21.3 sec hands-free; 01/19/23: 18 seconds hands-free; 02/23/23: 19 seconds hands-free; 03/16/2023: 18 seconds hands-free; 03/30/23: 17.3 seconds hands-free; 05/03/23: 17.3 seconds hands-free  Goal status: IN PROGRESS  3.  Patient will increase 10 meter walk test to >1.22m/s with WFL B step-length, upright posture for >90% of test to improve ease with gait and to override hypokentic and bradykinetic movement.  Baseline: 0.58 m/s with 0HK; 12/18: 0.7 m/s with 7QQ; 11/04/22: 0.69 m/s with 5ZD;  12/03/22: 0.73 m/s with 6LO; 01/19/2023: 0.77 m/s with 7FI; 02/23/2023: 0.83 m/s with 4PP; 03/16/2023: 0.83 m/s with 2RJ , pt still somewhat crouched, requires cues for upright posture >50% of time; 03/30/23: 0.83 m/s with crouched posture; 05/03/23 0.8 m/s with RW, some improvement with upright posture  Goal status: IN PROGRESS  4.  Patient will reduce time to don jacket by at least 5 seconds in order to increase ease with dressing and override bradykinetic movement Baseline: 17 seconds; 12/18: 12 seconds Goal status: MET  5.  Pt will demo or report ability to open standard jar using only 1-2 attempts in order to exhibit improved functional mobility for ADLs.  Baseline: 5.7 seconds with multiple attempts to turn jar lid to open; 12/18: 2.8 sec opens in 2 turns Goal status: MET  6.  Patient will increase Berg Balance score by > 6 points to demonstrate decreased fall risk during functional activities. Baseline: to be tested next 1-2 visits; 12/03/2022: 44/56; 01/21/23 45/56; 48/56; 03/17/2023: 48/56; 05/03/23: 50/56 Goal status: Partially met   7.  Patient will report at least 4 days in a row with minimal to no L hip pain in order to improve QOL and  ease/safety with functional mobility. Baseline: Pt reports pain majority of days in L hip; 12/03/2022: pt reports having hip pain about every day; 3/19: rates hip pain daily (being seen now by MD, new imaging); 02/23/2023: pt went for period of time without hip pain (she thinks a week), but reports it has now come back,; 5/28: pt reports hip pain "every other day"; 05/03/23: pt reports pain in hip every day in past week Goal status: NOT MET/discontinued   8. Patient will increase six minute walk test distance to >1000 for progression to community ambulator and improve gait ability Baseline:02/23/2023: 722 ft with 4WW and two standing rest breaks, rates test as hard; 03/16/2023: deferred to next visit due to time; 5/28/20204: 641 ft with 4WW, pt requests to end test early due to fatigue at 5 min 35 sec; 05/03/23: 645 ft with 4WW and able to complete full test  Goal status: IN PROGRESS  ASSESSMENT:  CLINICAL IMPRESSION: Pt able to complete 6 min of ambulation with 4WW without rest breaks and covers approx 740 ft. This likely indicates an improvement in functional capacity. Will reassess for progress visit next appointment. While pt with improvement, she still was fatigued following intervention requesting a rest break. The pt will benefit from further skilled PT to improve strength, balance, gait and mobility.  OBJECTIVE IMPAIRMENTS: Abnormal gait, decreased balance, decreased cognition, decreased coordination, decreased endurance, decreased knowledge of use  of DME, decreased mobility, difficulty walking, decreased strength, dizziness, hypomobility, impaired UE functional use, improper body mechanics, postural dysfunction, and pain.   ACTIVITY LIMITATIONS: lifting, bending, standing, squatting, stairs, transfers, bed mobility, bathing, dressing, hygiene/grooming, and locomotion level  PARTICIPATION LIMITATIONS: meal prep, cleaning, laundry, medication management, personal finances, driving, shopping, community  activity, and yard work  PERSONAL FACTORS: Age, Fitness, Sex, Time since onset of injury/illness/exacerbation, and 3+ comorbidities: PMH includes hx of breast cancer, HTN, MCI, insomnia, cataracts, bipolar 1, alcohol use disorder, GAD, MDD, osteopenia of neck of R femur, chronic B low back pain with radiating pain to buttock and posterior thighs and calves. Pt with multiple comorbidities, please refer to chart for full details  are also affecting patient's functional outcome.   REHAB POTENTIAL: Fair    CLINICAL DECISION MAKING: Evolving/moderate complexity  EVALUATION COMPLEXITY: High  PLAN:  PT FREQUENCY: 2x/week  PT DURATION: 12 weeks  PLANNED INTERVENTIONS: Therapeutic exercises, Therapeutic activity, Neuromuscular re-education, Balance training, Gait training, Patient/Family education, Self Care, Joint mobilization, Stair training, Vestibular training, Canalith repositioning, Visual/preceptual remediation/compensation, Orthotic/Fit training, DME instructions, Wheelchair mobility training, Spinal mobilization, Cryotherapy, Moist heat, Splintting, Taping, and Manual therapy  PLAN FOR NEXT SESSION: hamtrings loading, hip strength, mobility, endurance, gait and balance, cardio, bed mobility   2:06 PM, 06/08/23    Baird Kay, PT 06/08/2023, 2:06 PM  Physical Therapist - [facility]  Outpatient Physical Therapy- Main Campus [phone]

## 2023-06-10 ENCOUNTER — Telehealth: Payer: Self-pay

## 2023-06-10 ENCOUNTER — Ambulatory Visit: Payer: Medicare PPO

## 2023-06-10 DIAGNOSIS — R42 Dizziness and giddiness: Secondary | ICD-10-CM | POA: Diagnosis not present

## 2023-06-10 DIAGNOSIS — R278 Other lack of coordination: Secondary | ICD-10-CM

## 2023-06-10 DIAGNOSIS — M6281 Muscle weakness (generalized): Secondary | ICD-10-CM

## 2023-06-10 DIAGNOSIS — R262 Difficulty in walking, not elsewhere classified: Secondary | ICD-10-CM | POA: Diagnosis not present

## 2023-06-10 DIAGNOSIS — R2681 Unsteadiness on feet: Secondary | ICD-10-CM

## 2023-06-10 DIAGNOSIS — M25552 Pain in left hip: Secondary | ICD-10-CM | POA: Diagnosis not present

## 2023-06-10 NOTE — Telephone Encounter (Signed)
PT spoke with pt's spouse (pt caregiver) over secure phone line returning his call. Pt's spouse had questions about decreased PT frequency to 1x/week. PT explained that pt's progress has plateaued, but also that pt has also declined on multiple tests/outcome measures. PT explained plan/that PT is requesting 8 more weeks at 1x/week to determine if pt able to make any further progress. PT also provided education on importance of increased activity levels outside of sessions and importance of increasing compliance with HEP to see gains as pt has not been consistently performing HEP. PT and spouse reviewed other possible activities pt could pursue outside of therapy, and also discussed HH PT as future option. Pt's spouse verbalized understanding.  Temple Pacini PT, DPT

## 2023-06-10 NOTE — Therapy (Signed)
OUTPATIENT NEURO PHYSICAL THERAPY TREATMENT/Physical Therapy Progress Note/RECERT   Dates of reporting period  05/03/2023   to   06/10/2023     Patient Name: Kendra [name] MRN: [id number] DOB:1946-09-27, 77 y.o., female Today's Date: 06/10/2023  PCP: Erasmo Downer, MD REFERRING PROVIDER: Erasmo Downer, MD  END OF SESSION:  PT End of Session - 06/10/23 1311     Visit Number 60    Number of Visits 68    Date for PT Re-Evaluation 08/05/23    Authorization Type [employer] PPO    Authorization Time Period 11/04/2022-12/30/2022    Progress Note Due on Visit 30    PT Start Time 1313    PT Stop Time 1355    PT Time Calculation (min) 42 min    Equipment Utilized During Treatment Gait belt    Activity Tolerance Patient tolerated treatment well;Patient limited by fatigue    Behavior During Therapy WFL for tasks assessed/performed                             Past Medical History:  Diagnosis Date   Anxiety    Breast cancer (HCC) 03/2018   right breast cancer at 11:00 and 1:00   Bursitis    bilateral hips and knees   Complication of anesthesia    Depression    Family history of breast cancer 03/24/2018   Family history of lung cancer 03/24/2018   History of alcohol dependence (HCC) 2007   no ETOH; resolved since 2007   History of hiatal hernia    History of psychosis 2014   due to sleep disturbance   Hypertension    Parkinson disease    Personal history of radiation therapy 06/2018-07/2018   right breast ca   PONV (postoperative nausea and vomiting)    Past Surgical History:  Procedure Laterality Date   BREAST BIOPSY Right 03/14/2018   11:00 DCIS and invasive ductal carcinoma   BREAST BIOPSY Right 03/14/2018   1:00 Invasive ductal carcinoma   BREAST LUMPECTOMY Right 04/27/2018   lumpectomy of 11 and 1:00 cancers, clear margins, negative LN   BREAST LUMPECTOMY WITH RADIOACTIVE SEED AND SENTINEL LYMPH NODE BIOPSY Right 04/27/2018    Procedure: RIGHT BREAST LUMPECTOMY WITH RADIOACTIVE SEED X 2 AND RIGHT SENTINEL LYMPH NODE BIOPSY;  Surgeon: Glenna Fellows, MD;  Location:  SURGERY CENTER;  Service: General;  Laterality: Right;   CATARACT EXTRACTION W/PHACO Left 08/30/2019   Procedure: CATARACT EXTRACTION PHACO AND INTRAOCULAR LENS PLACEMENT (IOC) LEFT panoptix toric  01:20.5  12.7%  10.26;  Surgeon: Lockie Mola, MD;  Location: [facility];  Service: Ophthalmology;  Laterality: Left;   CATARACT EXTRACTION W/PHACO Right 09/20/2019   Procedure: CATARACT EXTRACTION PHACO AND INTRAOCULAR LENS PLACEMENT (IOC) RIGHT 5.71  00:36.4  15.7%;  Surgeon: Lockie Mola, MD;  Location: [facility];  Service: Ophthalmology;  Laterality: Right;   LAPAROTOMY N/A 02/10/2018   Procedure: EXPLORATORY LAPAROTOMY;  Surgeon: Shonna Chock, MD;  Location: [facility];  Service: Gynecology;  Laterality: N/A;   MASS EXCISION  01/2018   abdominal   OVARIAN CYST REMOVAL     SALPINGOOPHORECTOMY Bilateral 02/10/2018   Procedure: BILATERAL SALPINGO OOPHORECTOMY; PERITONEAL WASHINGS;  Surgeon: Shonna Chock, MD;  Location: [facility];  Service: Gynecology;  Laterality: Bilateral;   TONSILLECTOMY     TONSILLECTOMY AND ADENOIDECTOMY  1953   Patient Active Problem List   Diagnosis Date Noted   Neurogenic bladder  05/14/2023   Prediabetes 12/15/2022   Chronic cough 08/17/2022   History of allergy to shellfish 02/24/2022   Chronic low back pain of over 3 months duration 02/04/2022   Osteoarthritis of  AC (acromioclavicular) joint (Left) 02/04/2022   Osteoarthritis of hip (Right) 02/04/2022   Impaction fracture of distal end of radius and ulna, sequela (Left) 02/04/2022   Osteoarthritis of 1st (thumb) carpometacarpal (CMC) joint of hand (Left) 02/04/2022   Lumbosacral facet arthropathy (Multilevel) (Bilateral) (T12-S1) 02/04/2022   Lumbar central spinal stenosis w/o neurogenic claudication (L2-3, L3-4, L4-5) 02/04/2022    Lumbar foraminal stenosis (Bilateral: L2-3, L4-5) (Right: (Severe) L4-5) 02/04/2022   Lumbar lateral recess stenosis (Bilateral: L2-3, L3-4, and L4-5) (Severe) 02/04/2022   DDD (degenerative disc disease), lumbar 02/04/2022   Lumbosacral facet syndrome (Multilevel) (Bilateral) 02/04/2022   Osteoarthritis of lumbar spine 02/04/2022   Allergic conjunctivitis of both eyes 12/23/2021   Chronic hip pain (3ry area of Pain) (Bilateral) (L>R) 12/22/2021   Chronic lower extremity pain (2ry area of Pain) (Bilateral) (L>R) 12/22/2021   Abnormal MRI, lumbar spine (01/31/2020) 12/22/2021   Grade 1 Anterolisthesis of lumbar spine (L3/L4) & (5 mm) (L4/L5) 12/22/2021   Chronic pain syndrome 11/17/2021   High risk medication use 11/17/2021   Disorder of skeletal system 11/17/2021   Problems influencing health status 11/17/2021   Other hyperlipidemia 10/21/2021   Polypharmacy 08/12/2021   Chronic low back pain (1ry area of Pain) (Bilateral) (L>R) w/o sciatica 06/18/2021   Osteopenia of neck of femur (Right) 09/06/2020   Obesity 08/29/2020   Parkinson disease 02/22/2020   Mucinous cystadenoma 12/24/2019   Bipolar disorder, in full remission, most recent episode depressed (HCC) 11/08/2019   Vitamin D insufficiency 08/24/2019   Insomnia due to mental condition 05/31/2019   Bipolar I disorder, most recent episode depressed (HCC) 05/31/2019   Alcohol use disorder, moderate, in sustained remission (HCC) 05/31/2019   Elevated tumor markers 05/27/2019   GAD (generalized anxiety disorder) 04/10/2019   MDD (major depressive disorder), severe (HCC) 01/31/2019   High serum carbohydrate antigen 19-9 (CA19-9) 12/01/2018   Adjustment disorder with anxious mood 11/24/2018   Essential hypertension 11/24/2018   B12 deficiency 10/17/2018   Incisional hernia, without obstruction or gangrene 09/12/2018   Genetic testing 04/07/2018   Family history of breast cancer 03/24/2018   Family history of lung cancer  03/24/2018   Malignant neoplasm of upper-outer quadrant of right breast in female, estrogen receptor positive (HCC) 03/17/2018   Malignant neoplasm of upper-inner quadrant of right breast in female, estrogen receptor positive (HCC) 03/17/2018   History of psychosis 02/07/2018   History of insomnia 02/07/2018    ONSET DATE: Pt diagnosed with PD in 2019  REFERRING DIAG:  G20 (ICD-10-CM) - Parkinson's disease      THERAPY DIAG:  Difficulty in walking, not elsewhere classified  Other lack of coordination  Muscle weakness (generalized)  Unsteadiness on feet  Rationale for Evaluation and Treatment: Rehabilitation  SUBJECTIVE: Pt reports no updates today She is unsure of how often she completes HEP.   Pain: none currently Pt accompanied by: significant other self, brought to session by caregiver  PERTINENT HISTORY:  Pt is a pleasant 77 yo female who was diagnosed with PD in 2019. She is known to clinic and [facility], and was last seen for LSVT BIG Sept-Oct 2022. Pt with chronic pain and has become increasingly sedentary as a result exacerbating mobility difficulties due to PD. Current concerns: Pt reports L and R hip pain and back pain. Pt  was being seen by pain management clinic. Pt has had injections that provided relief at the time. Pt's spouse reports her physician determined surgery was not an option for her pain so the plan is to manage it. Pt spouse thinks pain has resulted in reluctance for pt to move. Pt currently sedentary, doesn't walk much, but when she does she holds on to furniture for support due to balance problems. When pt first stands she must hold on to something due to mix of pain and/or dizziness (described as lightheadedness). Her pain can go from 3/10 to 9/10. It does improve once she starts moving. Pt has increased difficulty with multiple activities including: getting in and out of her bed, getting in and out of her car, freezing, decreased gait speed, decreased balance  and cognition, and difficulty using BUE due to tremors.  Per chart other PMH includes hx of breast cancer, HTN, MCI, insomnia, cataracts, bipolar 1, alcohol use disorder, GAD, MDD, osteopenia of neck of R femur, chronic B low back pain with radiating pain to buttock and posterior thighs and calves. Pt with multiple comorbidities, please refer to chart for full details    PAIN: from eval Are you having pain? Yes: NPRS scale: not rated currently but can reach 9/10 Pain location: L hip and L leg Pain description: pt has difficulty describing Aggravating factors: unsure Relieving factors: laying in the recliner helps, movement  PRECAUTIONS: Fall  WEIGHT BEARING RESTRICTIONS: No  FALLS: Has patient fallen in last 6 months? Yes. Number of falls 1x, leg tangled in chair leg when getting up from a chair  PLOF: Independent with basic ADLs  PATIENT GOALS: improve mobility   BLE Strength Assessment      Right  Left  Hip Flexion 5/5 4/5 (painful)  Hip Horizontal ABDCT  5/5 5/5  Hip Horizontal ADD 4+/5 4+/5  Hip IR (seated)  4+/5 4+/5  Hip ER (seated) 5/5 5/5  Knee Extension 5/5 5/5  Knee Flexion (seated) 4+/5  5/5 (not painful)  Hip ABDCT (supine)  3+/5 4-/5   Focal popliteal edema Left knee, not painful   ROM Assessment Hip     Right  Left   Flexion Supine 132 120 (ouch)  External rotation (90/90) 30 47  Internal Rotation (90/90) 40  20  Supine Hip Extension P/ROM >0 degrees >0 degrees       IMAGING: via chart   07/24/22 DG BONE density " ASSESSMENT: The probability of a major osteoporotic fracture is 15.8% within the next ten years.   The probability of a hip fracture is 2.9% within the next ten years."   12/22/21 DG lumbar spine " IMPRESSION: 1. No acute displaced fracture or traumatic listhesis of the lumbar spine. 2. Interval development of grade 1 anterolisthesis of L3 on L4. Stable grade 1 anterolisthesis of L4 on L5. Appears stable on flexion extension views. 3.   Aortic Atherosclerosis (ICD10-I70.0)."   New from chart:  DG Lumbar spine 01/05/23: "IMPRESSION: 1. Lumbar spine scoliosis concave right. Diffuse multilevel degenerative change. Disc degeneration most prominent at L2-L3 and L5-S1. 5 mm anterolisthesis L4 on L5. Diffuse severe facet hypertrophy. No flexion or extension abnormality identified. No evidence of fracture. 2. Sclerotic densities are noted in the L2 and L4 vertebral bodies. Although these could represent bone islands blastic metastatic disease cannot be excluded. MRI of the lumbar spine suggested for further evaluation. 3. Large amount of stool noted throughout the colon. 4. Aortoiliac atherosclerotic vascular disease."    DG hip 01/05/23: "  IMPRESSION: Moderate bilateral femoroacetabular osteoarthritis, similar to prior."   PATIENT EDUCATION:  Education details: Pt educated throughout session about proper posture and technique with exercises. Improved exercise technique, movement at target joints, use of target muscles after min to mod verbal, visual, tactile cues.  Person educated: Patient and Spouse Education method: Explanation, Demonstration, and Verbal cues Education comprehension: verbalized understanding, returned demonstration, verbal cues required, tactile cues required, and needs further education   TODAY'S TREATMENT:                                                                                                                                         DATE: 06/10/23   Gait belt donned and CGA provided throughout unless specified otherwise  TA:  Goal retesting completed on this date. Please refer to assessment and goal section below for details.   HOME EXERCISE PROGRAM: pt to continue HEP as previously given  Access Code: [email or web address] URL: [email or web address] Date: 12/22/2022 Prepared by: Temple Pacini  Exercises - Sit to Stand with Counter Support  - 1 x daily - 5 x weekly - 3 sets - 10  reps  GOALS: Goals reviewed with patient? Yes  SHORT TERM GOALS: Target date: 04/27/2023    Patient will be independent in home exercise program to improve strength/mobility for better functional independence with ADLs. Baseline: LSVT BIG protocol to be initiated next session; 12/18: not yet fully indep, reports she still gets confused about the technique; 12/03/2022: HEP to be updated; Pt reports practicing HEP 2x/week; 02/23/23: not currently performing HEP, reviewed in session and provided new copy; 03/16/2023: pt reports performing HEP every day; 03/30/23: Pt reports she is not being consistent with her HEP; 7/1: pt reports she is not doing much at home; 06/10/23: pt unsure how often she is exercising at home Goal status: MET (previously MET)   LONG TERM GOALS: Target date: 06/08/2023     Patient will increase FOTO score to equal to or greater than 49 to demonstrate statistically significant improvement in mobility and quality of life.  Baseline: 40; 12/18: deferred due to time; 45; 3/19: deferred to next visit; 01/21/2023; 02/23/2023: 46 unchanged from previous assessment); 03/17/2023: 48; 06/10/23: 52 Goal status: MET  2.  Patient (> 66 years old) will complete five times sit to stand test in < 15 seconds indicating decreased fall risk.  Baseline: 27 sec; 12/18 23 sec hands-free; 11/04/22: 24 sec hands free; 2/1/124: 22.7 sec use of BUE, 21.3 sec hands-free; 01/19/23: 18 seconds hands-free; 02/23/23: 19 seconds hands-free; 03/16/2023: 18 seconds hands-free; 03/30/23: 17.3 seconds hands-free; 05/03/23: 17.3 seconds hands-free; 06/10/23: 19 seconds hands-free Goal status: IN PROGRESS  3.  Patient will increase 10 meter walk test to >1.75m/s with WFL B step-length, upright posture for >90% of test to improve ease with gait and to override hypokentic and bradykinetic movement.  Baseline: 0.58 m/s with 8JX; 12/18: 0.7 m/s with 9JY; 11/04/22:  0.69 m/s with 2NF;  12/03/22: 0.73 m/s with 6OZ; 01/19/2023: 0.77 m/s with  3YQ; 02/23/2023: 0.83 m/s with 6VH; 03/16/2023: 0.83 m/s with 8IO , pt still somewhat crouched, requires cues for upright posture >50% of time; 03/30/23: 0.83 m/s with crouched posture; 05/03/23 0.8 m/s with RW, some improvement with upright posture; 06/10/23:  0.77 m/s with 4WW and crouched posture  Goal status: IN PROGRESS  4.  Patient will reduce time to don jacket by at least 5 seconds in order to increase ease with dressing and override bradykinetic movement Baseline: 17 seconds; 12/18: 12 seconds Goal status: MET  5.  Pt will demo or report ability to open standard jar using only 1-2 attempts in order to exhibit improved functional mobility for ADLs.  Baseline: 5.7 seconds with multiple attempts to turn jar lid to open; 12/18: 2.8 sec opens in 2 turns Goal status: MET  6.  Patient will increase Berg Balance score by > 6 points to demonstrate decreased fall risk during functional activities. Baseline: to be tested next 1-2 visits; 12/03/2022: 44/56; 01/21/23 45/56; 48/56; 03/17/2023: 48/56; 05/03/23: 50/56; 8/8: 48/56 Goal status: Partially met   7.  Patient will report at least 4 days in a row with minimal to no L hip pain in order to improve QOL and ease/safety with functional mobility. Baseline: Pt reports pain majority of days in L hip; 12/03/2022: pt reports having hip pain about every day; 3/19: rates hip pain daily (being seen now by MD, new imaging); 02/23/2023: pt went for period of time without hip pain (she thinks a week), but reports it has now come back,; 5/28: pt reports hip pain "every other day"; 05/03/23: pt reports pain in hip every day in past week Goal status: NOT MET/discontinued   8. Patient will increase six minute walk test distance to >1000 for progression to community ambulator and improve gait ability Baseline:02/23/2023: 722 ft with 4WW and two standing rest breaks, rates test as hard; 03/16/2023: deferred to next visit due to time; 5/28/20204: 641 ft with 4WW, pt requests to end  test early due to fatigue at 5 min 35 sec; 05/03/23: 645 ft with 4WW and able to complete full test; 06/10/23: 708 ft Goal status: IN PROGRESS  ASSESSMENT:  Goals reassessed for progress note and recert completed on this date. Pt has improved performance compared to last assessment, indicating increased functional capacity/gait ability. While pt with improvement on , pt with slight decreased performance on 5xSTS, and BERG, indicating pt with impairment on LE strength/power, fall risk, gait speed, and balance. Due to pt performance today, PT and pt discussed how she has appeared to reach a plateau with most of her goals. Plan to try another 8 weeks of PT at decreased frequency of 1x/week to determine if pt still able to make gains, otherwise will prepare for d/c. Pt agreeable to plan. Patient's condition has the potential to improve in response to therapy. Maximum improvement is yet to be obtained.The pt will benefit from further skilled PT to improve strength, balance, gait and mobility.  OBJECTIVE IMPAIRMENTS: Abnormal gait, decreased balance, decreased cognition, decreased coordination, decreased endurance, decreased knowledge of use of DME, decreased mobility, difficulty walking, decreased strength, dizziness, hypomobility, impaired UE functional use, improper body mechanics, postural dysfunction, and pain.   ACTIVITY LIMITATIONS: lifting, bending, standing, squatting, stairs, transfers, bed mobility, bathing, dressing, hygiene/grooming, and locomotion level  PARTICIPATION LIMITATIONS: meal prep, cleaning, laundry, medication management, personal finances, driving, shopping, community activity, and yard work  PERSONAL FACTORS: Age, Fitness, Sex, Time since onset of injury/illness/exacerbation, and 3+ comorbidities: PMH includes hx of breast cancer, HTN, MCI, insomnia, cataracts, bipolar 1, alcohol use disorder, GAD, MDD, osteopenia of neck of R femur, chronic B low back pain with radiating  pain to buttock and posterior thighs and calves. Pt with multiple comorbidities, please refer to chart for full details  are also affecting patient's functional outcome.   REHAB POTENTIAL: Fair    CLINICAL DECISION MAKING: Evolving/moderate complexity  EVALUATION COMPLEXITY: High  PLAN:  PT FREQUENCY: 1-2x/week  PT DURATION: 8 weeks  PLANNED INTERVENTIONS: Therapeutic exercises, Therapeutic activity, Neuromuscular re-education, Balance training, Gait training, Patient/Family education, Self Care, Joint mobilization, Stair training, Vestibular training, Canalith repositioning, Visual/preceptual remediation/compensation, Orthotic/Fit training, DME instructions, Wheelchair mobility training, Spinal mobilization, Cryotherapy, Moist heat, Splintting, Taping, and Manual therapy  PLAN FOR NEXT SESSION: hamtrings loading, hip strength, mobility, endurance, gait and balance, cardio, bed mobility   2:09 PM, 06/10/23    Baird Kay, PT 06/10/2023, 2:09 PM  Physical Therapist - [facility]  Outpatient Physical Therapy- Main Campus [phone]

## 2023-06-14 NOTE — Therapy (Signed)
OUTPATIENT NEURO PHYSICAL THERAPY TREATMENT     Patient Name: Kendra [name] MRN: [id number] DOB:[birth year], 77 y.o., female Today's Date: 06/15/2023  PCP: Erasmo Downer, MD REFERRING PROVIDER: Erasmo Downer, MD  END OF SESSION:  PT End of Session - 06/15/23 1141     Visit Number 61    Number of Visits 68    Date for PT Re-Evaluation 08/05/23    Authorization Type [employer] PPO    Authorization Time Period 11/04/2022-12/30/2022    Progress Note Due on Visit 30    PT Start Time 1145    PT Stop Time 1229    PT Time Calculation (min) 44 min    Equipment Utilized During Treatment Gait belt    Activity Tolerance Patient tolerated treatment well;Patient limited by fatigue    Behavior During Therapy WFL for tasks assessed/performed                              Past Medical History:  Diagnosis Date   Anxiety    Breast cancer (HCC) 03/2018   right breast cancer at 11:00 and 1:00   Bursitis    bilateral hips and knees   Complication of anesthesia    Depression    Family history of breast cancer 03/24/2018   Family history of lung cancer 03/24/2018   History of alcohol dependence (HCC) 2007   no ETOH; resolved since 2007   History of hiatal hernia    History of psychosis 2014   due to sleep disturbance   Hypertension    Parkinson disease    Personal history of radiation therapy 06/2018-07/2018   right breast ca   PONV (postoperative nausea and vomiting)    Past Surgical History:  Procedure Laterality Date   BREAST BIOPSY Right 03/14/2018   11:00 DCIS and invasive ductal carcinoma   BREAST BIOPSY Right 03/14/2018   1:00 Invasive ductal carcinoma   BREAST LUMPECTOMY Right 04/27/2018   lumpectomy of 11 and 1:00 cancers, clear margins, negative LN   BREAST LUMPECTOMY WITH RADIOACTIVE SEED AND SENTINEL LYMPH NODE BIOPSY Right 04/27/2018   Procedure: RIGHT BREAST LUMPECTOMY WITH RADIOACTIVE SEED X 2 AND RIGHT SENTINEL LYMPH NODE  BIOPSY;  Surgeon: Glenna Fellows, MD;  Location: [facility];  Service: General;  Laterality: Right;   CATARACT EXTRACTION W/PHACO Left 08/30/2019   Procedure: CATARACT EXTRACTION PHACO AND INTRAOCULAR LENS PLACEMENT (IOC) LEFT panoptix toric  01:20.5  12.7%  10.26;  Surgeon: Lockie Mola, MD;  Location: [facility];  Service: Ophthalmology;  Laterality: Left;   CATARACT EXTRACTION W/PHACO Right 09/20/2019   Procedure: CATARACT EXTRACTION PHACO AND INTRAOCULAR LENS PLACEMENT (IOC) RIGHT 5.71  00:36.4  15.7%;  Surgeon: Lockie Mola, MD;  Location: [facility];  Service: Ophthalmology;  Laterality: Right;   LAPAROTOMY N/A 02/10/2018   Procedure: EXPLORATORY LAPAROTOMY;  Surgeon: Shonna Chock, MD;  Location: [facility];  Service: Gynecology;  Laterality: N/A;   MASS EXCISION  01/2018   abdominal   OVARIAN CYST REMOVAL     SALPINGOOPHORECTOMY Bilateral 02/10/2018   Procedure: BILATERAL SALPINGO OOPHORECTOMY; PERITONEAL WASHINGS;  Surgeon: Shonna Chock, MD;  Location: [facility];  Service: Gynecology;  Laterality: Bilateral;   TONSILLECTOMY     TONSILLECTOMY AND ADENOIDECTOMY  1953   Patient Active Problem List   Diagnosis Date Noted   Neurogenic bladder 05/14/2023   Prediabetes 12/15/2022   Chronic cough 08/17/2022   History of allergy to  shellfish 02/24/2022   Chronic low back pain of over 3 months duration 02/04/2022   Osteoarthritis of  AC (acromioclavicular) joint (Left) 02/04/2022   Osteoarthritis of hip (Right) 02/04/2022   Impaction fracture of distal end of radius and ulna, sequela (Left) 02/04/2022   Osteoarthritis of 1st (thumb) carpometacarpal (CMC) joint of hand (Left) 02/04/2022   Lumbosacral facet arthropathy (Multilevel) (Bilateral) (T12-S1) 02/04/2022   Lumbar central spinal stenosis w/o neurogenic claudication (L2-3, L3-4, L4-5) 02/04/2022   Lumbar foraminal stenosis (Bilateral: L2-3, L4-5) (Right: (Severe) L4-5) 02/04/2022    Lumbar lateral recess stenosis (Bilateral: L2-3, L3-4, and L4-5) (Severe) 02/04/2022   DDD (degenerative disc disease), lumbar 02/04/2022   Lumbosacral facet syndrome (Multilevel) (Bilateral) 02/04/2022   Osteoarthritis of lumbar spine 02/04/2022   Allergic conjunctivitis of both eyes 12/23/2021   Chronic hip pain (3ry area of Pain) (Bilateral) (L>R) 12/22/2021   Chronic lower extremity pain (2ry area of Pain) (Bilateral) (L>R) 12/22/2021   Abnormal MRI, lumbar spine (01/31/2020) 12/22/2021   Grade 1 Anterolisthesis of lumbar spine (L3/L4) & (5 mm) (L4/L5) 12/22/2021   Chronic pain syndrome 11/17/2021   High risk medication use 11/17/2021   Disorder of skeletal system 11/17/2021   Problems influencing health status 11/17/2021   Other hyperlipidemia 10/21/2021   Polypharmacy 08/12/2021   Chronic low back pain (1ry area of Pain) (Bilateral) (L>R) w/o sciatica 06/18/2021   Osteopenia of neck of femur (Right) 09/06/2020   Obesity 08/29/2020   Parkinson disease 02/22/2020   Mucinous cystadenoma 12/24/2019   Bipolar disorder, in full remission, most recent episode depressed (HCC) 11/08/2019   Vitamin D insufficiency 08/24/2019   Insomnia due to mental condition 05/31/2019   Bipolar I disorder, most recent episode depressed (HCC) 05/31/2019   Alcohol use disorder, moderate, in sustained remission (HCC) 05/31/2019   Elevated tumor markers 05/27/2019   GAD (generalized anxiety disorder) 04/10/2019   MDD (major depressive disorder), severe (HCC) 01/31/2019   High serum carbohydrate antigen 19-9 (CA19-9) 12/01/2018   Adjustment disorder with anxious mood 11/24/2018   Essential hypertension 11/24/2018   B12 deficiency 10/17/2018   Incisional hernia, without obstruction or gangrene 09/12/2018   Genetic testing 04/07/2018   Family history of breast cancer 03/24/2018   Family history of lung cancer 03/24/2018   Malignant neoplasm of upper-outer quadrant of right breast in female, estrogen  receptor positive (HCC) 03/17/2018   Malignant neoplasm of upper-inner quadrant of right breast in female, estrogen receptor positive (HCC) 03/17/2018   History of psychosis 02/07/2018   History of insomnia 02/07/2018    ONSET DATE: Pt diagnosed with PD in 2019  REFERRING DIAG:  G20 (ICD-10-CM) - Parkinson's disease      THERAPY DIAG:  Difficulty in walking, not elsewhere classified  Muscle weakness (generalized)  Unsteadiness on feet  Pain in left hip  Dizziness and giddiness  Rationale for Evaluation and Treatment: Rehabilitation  SUBJECTIVE:  Patient reports no falls or LOB since last session. Reports she is not sure of her HEP.   Pain: none currently Pt accompanied by: significant other self, brought to session by caregiver  PERTINENT HISTORY:  Pt is a pleasant 77 yo female who was diagnosed with PD in 2019. She is known to clinic and [facility], and was last seen for LSVT BIG Sept-Oct 2022. Pt with chronic pain and has become increasingly sedentary as a result exacerbating mobility difficulties due to PD. Current concerns: Pt reports L and R hip pain and back pain. Pt was being seen by pain management clinic. Pt  has had injections that provided relief at the time. Pt's spouse reports her physician determined surgery was not an option for her pain so the plan is to manage it. Pt spouse thinks pain has resulted in reluctance for pt to move. Pt currently sedentary, doesn't walk much, but when she does she holds on to furniture for support due to balance problems. When pt first stands she must hold on to something due to mix of pain and/or dizziness (described as lightheadedness). Her pain can go from 3/10 to 9/10. It does improve once she starts moving. Pt has increased difficulty with multiple activities including: getting in and out of her bed, getting in and out of her car, freezing, decreased gait speed, decreased balance and cognition, and difficulty using BUE due to tremors.   Per chart other PMH includes hx of breast cancer, HTN, MCI, insomnia, cataracts, bipolar 1, alcohol use disorder, GAD, MDD, osteopenia of neck of R femur, chronic B low back pain with radiating pain to buttock and posterior thighs and calves. Pt with multiple comorbidities, please refer to chart for full details    PAIN: from eval Are you having pain? Yes: NPRS scale: not rated currently but can reach 9/10 Pain location: L hip and L leg Pain description: pt has difficulty describing Aggravating factors: unsure Relieving factors: laying in the recliner helps, movement  PRECAUTIONS: Fall  WEIGHT BEARING RESTRICTIONS: No  FALLS: Has patient fallen in last 6 months? Yes. Number of falls 1x, leg tangled in chair leg when getting up from a chair  PLOF: Independent with basic ADLs  PATIENT GOALS: improve mobility   BLE Strength Assessment      Right  Left  Hip Flexion 5/5 4/5 (painful)  Hip Horizontal ABDCT  5/5 5/5  Hip Horizontal ADD 4+/5 4+/5  Hip IR (seated)  4+/5 4+/5  Hip ER (seated) 5/5 5/5  Knee Extension 5/5 5/5  Knee Flexion (seated) 4+/5  5/5 (not painful)  Hip ABDCT (supine)  3+/5 4-/5   Focal popliteal edema Left knee, not painful   ROM Assessment Hip     Right  Left   Flexion Supine 132 120 (ouch)  External rotation (90/90) 30 47  Internal Rotation (90/90) 40  20  Supine Hip Extension P/ROM >0 degrees >0 degrees       IMAGING: via chart   07/24/22 DG BONE density " ASSESSMENT: The probability of a major osteoporotic fracture is 15.8% within the next ten years.   The probability of a hip fracture is 2.9% within the next ten years."   12/22/21 DG lumbar spine " IMPRESSION: 1. No acute displaced fracture or traumatic listhesis of the lumbar spine. 2. Interval development of grade 1 anterolisthesis of L3 on L4. Stable grade 1 anterolisthesis of L4 on L5. Appears stable on flexion extension views. 3.  Aortic Atherosclerosis (ICD10-I70.0)."   New from  chart:  DG Lumbar spine 01/05/23: "IMPRESSION: 1. Lumbar spine scoliosis concave right. Diffuse multilevel degenerative change. Disc degeneration most prominent at L2-L3 and L5-S1. 5 mm anterolisthesis L4 on L5. Diffuse severe facet hypertrophy. No flexion or extension abnormality identified. No evidence of fracture. 2. Sclerotic densities are noted in the L2 and L4 vertebral bodies. Although these could represent bone islands blastic metastatic disease cannot be excluded. MRI of the lumbar spine suggested for further evaluation. 3. Large amount of stool noted throughout the colon. 4. Aortoiliac atherosclerotic vascular disease."    DG hip 01/05/23: "IMPRESSION: Moderate bilateral femoroacetabular osteoarthritis, similar to prior."  PATIENT EDUCATION:  Education details: Pt educated throughout session about proper posture and technique with exercises. Improved exercise technique, movement at target joints, use of target muscles after min to mod verbal, visual, tactile cues.  Person educated: Patient and Spouse Education method: Explanation, Demonstration, and Verbal cues Education comprehension: verbalized understanding, returned demonstration, verbal cues required, tactile cues required, and needs further education   TODAY'S TREATMENT:                                                                                                                                         DATE: 06/15/23   Gait belt donned and CGA provided throughout unless specified otherwise   Circuit training incorporates both TE and NMR        Circuit 1: Ambulate 150 ft with rollator with CGA Tandem stance 30 sec each LE position hands-free Large amplitude movement:  Seated FWD reach to floor>reach above head>bilat shoulder abduction (90 deg) 10x with 5 sec hold/rep - pt reports intervention is fatiguing    Circuit 2: Seated hamstring stretch LLE 30 seconds Ambulate 150 ft Tandem stance 30 sec each LE  position - intermittent UE support  Seated march 20x alt LE.  Seated LAQ 10x each LE  Large amplitude STS with BUE shoulder abduction 10x Seated TrA pressing into red swiss ball 3 seconds 12x  Seated red swiss ball rollouts 10x    Circuit 3: Seated reach and twist 10x each direction LAQ 10x each LE Thoracic twist and clap 10x each side    Pt with no increased pain with interventions   HOME EXERCISE PROGRAM: pt to continue HEP as previously given  Access Code: [email or web address] URL: [email or web address] Date: 12/22/2022 Prepared by: Temple Pacini  Exercises - Sit to Stand with Counter Support  - 1 x daily - 5 x weekly - 3 sets - 10 reps  GOALS: Goals reviewed with patient? Yes  SHORT TERM GOALS: Target date: 04/27/2023    Patient will be independent in home exercise program to improve strength/mobility for better functional independence with ADLs. Baseline: LSVT BIG protocol to be initiated next session; 12/18: not yet fully indep, reports she still gets confused about the technique; 12/03/2022: HEP to be updated; Pt reports practicing HEP 2x/week; 02/23/23: not currently performing HEP, reviewed in session and provided new copy; 03/16/2023: pt reports performing HEP every day; 03/30/23: Pt reports she is not being consistent with her HEP; 7/1: pt reports she is not doing much at home; 06/10/23: pt unsure how often she is exercising at home Goal status: MET (previously MET)   LONG TERM GOALS: Target date: 06/08/2023     Patient will increase FOTO score to equal to or greater than 49 to demonstrate statistically significant improvement in mobility and quality of life.  Baseline: 40; 12/18: deferred due to time; 45; 3/19: deferred to next visit; 01/21/2023; 02/23/2023: 46 unchanged from previous assessment); 03/17/2023:  48; 06/10/23: 52 Goal status: MET  2.  Patient (> 61 years old) will complete five times sit to stand test in < 15 seconds indicating decreased fall risk.  Baseline:  27 sec; 12/18 23 sec hands-free; 11/04/22: 24 sec hands free; 2/1/124: 22.7 sec use of BUE, 21.3 sec hands-free; 01/19/23: 18 seconds hands-free; 02/23/23: 19 seconds hands-free; 03/16/2023: 18 seconds hands-free; 03/30/23: 17.3 seconds hands-free; 05/03/23: 17.3 seconds hands-free; 06/10/23: 19 seconds hands-free Goal status: IN PROGRESS  3.  Patient will increase 10 meter walk test to >1.41m/s with WFL B step-length, upright posture for >90% of test to improve ease with gait and to override hypokentic and bradykinetic movement.  Baseline: 0.58 m/s with 9JY; 12/18: 0.7 m/s with 7WG; 11/04/22: 0.69 m/s with 9FA;  12/03/22: 0.73 m/s with 2ZH; 01/19/2023: 0.77 m/s with 0QM; 02/23/2023: 0.83 m/s with 5HQ; 03/16/2023: 0.83 m/s with 4ON , pt still somewhat crouched, requires cues for upright posture >50% of time; 03/30/23: 0.83 m/s with crouched posture; 05/03/23 0.8 m/s with RW, some improvement with upright posture; 06/10/23:  0.77 m/s with 4WW and crouched posture  Goal status: IN PROGRESS  4.  Patient will reduce time to don jacket by at least 5 seconds in order to increase ease with dressing and override bradykinetic movement Baseline: 17 seconds; 12/18: 12 seconds Goal status: MET  5.  Pt will demo or report ability to open standard jar using only 1-2 attempts in order to exhibit improved functional mobility for ADLs.  Baseline: 5.7 seconds with multiple attempts to turn jar lid to open; 12/18: 2.8 sec opens in 2 turns Goal status: MET  6.  Patient will increase Berg Balance score by > 6 points to demonstrate decreased fall risk during functional activities. Baseline: to be tested next 1-2 visits; 12/03/2022: 44/56; 01/21/23 45/56; 48/56; 03/17/2023: 48/56; 05/03/23: 50/56; 8/8: 48/56 Goal status: Partially met   7.  Patient will report at least 4 days in a row with minimal to no L hip pain in order to improve QOL and ease/safety with functional mobility. Baseline: Pt reports pain majority of days in L hip; 12/03/2022: pt  reports having hip pain about every day; 3/19: rates hip pain daily (being seen now by MD, new imaging); 02/23/2023: pt went for period of time without hip pain (she thinks a week), but reports it has now come back,; 5/28: pt reports hip pain "every other day"; 05/03/23: pt reports pain in hip every day in past week Goal status: NOT MET/discontinued   8. Patient will increase six minute walk test distance to >1000 for progression to community ambulator and improve gait ability Baseline:02/23/2023: 722 ft with 4WW and two standing rest breaks, rates test as hard; 03/16/2023: deferred to next visit due to time; 5/28/20204: 641 ft with 4WW, pt requests to end test early due to fatigue at 5 min 35 sec; 05/03/23: 645 ft with 4WW and able to complete full test; 06/10/23: 708 ft Goal status: IN PROGRESS  ASSESSMENT: Patient re-educated on HEP program and need for compliance. Patient is fatigued requiring multiple seated rest breaks. She has improved step length after cues for foot clearance. Large amplitude movements is challenging for patient with repetition of movements.  PT and pt discussed how she has appeared to reach a plateau with most of her goals. Plan to try another 8 weeks of PT at decreased frequency of 1x/week to determine if pt still able to make gains, otherwise will prepare for d/c. Pt agreeable to plan. Patient's  condition has the potential to improve in response to therapy. Maximum improvement is yet to be obtained.The pt will benefit from further skilled PT to improve strength, balance, gait and mobility.  OBJECTIVE IMPAIRMENTS: Abnormal gait, decreased balance, decreased cognition, decreased coordination, decreased endurance, decreased knowledge of use of DME, decreased mobility, difficulty walking, decreased strength, dizziness, hypomobility, impaired UE functional use, improper body mechanics, postural dysfunction, and pain.   ACTIVITY LIMITATIONS: lifting, bending, standing, squatting, stairs,  transfers, bed mobility, bathing, dressing, hygiene/grooming, and locomotion level  PARTICIPATION LIMITATIONS: meal prep, cleaning, laundry, medication management, personal finances, driving, shopping, community activity, and yard work  PERSONAL FACTORS: Age, Fitness, Sex, Time since onset of injury/illness/exacerbation, and 3+ comorbidities: PMH includes hx of breast cancer, HTN, MCI, insomnia, cataracts, bipolar 1, alcohol use disorder, GAD, MDD, osteopenia of neck of R femur, chronic B low back pain with radiating pain to buttock and posterior thighs and calves. Pt with multiple comorbidities, please refer to chart for full details  are also affecting patient's functional outcome.   REHAB POTENTIAL: Fair    CLINICAL DECISION MAKING: Evolving/moderate complexity  EVALUATION COMPLEXITY: High  PLAN:  PT FREQUENCY: 1-2x/week  PT DURATION: 8 weeks  PLANNED INTERVENTIONS: Therapeutic exercises, Therapeutic activity, Neuromuscular re-education, Balance training, Gait training, Patient/Family education, Self Care, Joint mobilization, Stair training, Vestibular training, Canalith repositioning, Visual/preceptual remediation/compensation, Orthotic/Fit training, DME instructions, Wheelchair mobility training, Spinal mobilization, Cryotherapy, Moist heat, Splintting, Taping, and Manual therapy  PLAN FOR NEXT SESSION: hamtrings loading, hip strength, mobility, endurance, gait and balance, cardio, bed mobility   12:28 PM, 06/15/23    Precious Bard, PT 06/15/2023, 12:28 PM  Physical Therapist - [facility]  Outpatient Physical Therapy- Main [facility] [phone]

## 2023-06-15 ENCOUNTER — Ambulatory Visit: Payer: Medicare PPO

## 2023-06-15 DIAGNOSIS — M25552 Pain in left hip: Secondary | ICD-10-CM | POA: Diagnosis not present

## 2023-06-15 DIAGNOSIS — R42 Dizziness and giddiness: Secondary | ICD-10-CM | POA: Diagnosis not present

## 2023-06-15 DIAGNOSIS — R262 Difficulty in walking, not elsewhere classified: Secondary | ICD-10-CM | POA: Diagnosis not present

## 2023-06-15 DIAGNOSIS — M6281 Muscle weakness (generalized): Secondary | ICD-10-CM

## 2023-06-15 DIAGNOSIS — R2681 Unsteadiness on feet: Secondary | ICD-10-CM | POA: Diagnosis not present

## 2023-06-15 DIAGNOSIS — R278 Other lack of coordination: Secondary | ICD-10-CM | POA: Diagnosis not present

## 2023-06-17 ENCOUNTER — Ambulatory Visit: Payer: Medicare PPO

## 2023-06-21 ENCOUNTER — Ambulatory Visit
Admission: RE | Admit: 2023-06-21 | Discharge: 2023-06-21 | Disposition: A | Discharge status: Home / Self Care | Payer: Medicare PPO | Source: Ambulatory Visit | Attending: Internal Medicine | Admitting: Internal Medicine

## 2023-06-21 DIAGNOSIS — Z170 Estrogen receptor positive status [ER+]: Secondary | ICD-10-CM | POA: Insufficient documentation

## 2023-06-21 DIAGNOSIS — C50211 Malignant neoplasm of upper-inner quadrant of right female breast: Secondary | ICD-10-CM | POA: Diagnosis not present

## 2023-06-21 DIAGNOSIS — Z1231 Encounter for screening mammogram for malignant neoplasm of breast: Secondary | ICD-10-CM | POA: Insufficient documentation

## 2023-06-22 ENCOUNTER — Ambulatory Visit: Payer: Medicare PPO

## 2023-06-22 DIAGNOSIS — R262 Difficulty in walking, not elsewhere classified: Secondary | ICD-10-CM | POA: Diagnosis not present

## 2023-06-22 DIAGNOSIS — M25552 Pain in left hip: Secondary | ICD-10-CM | POA: Diagnosis not present

## 2023-06-22 DIAGNOSIS — R278 Other lack of coordination: Secondary | ICD-10-CM | POA: Diagnosis not present

## 2023-06-22 DIAGNOSIS — R42 Dizziness and giddiness: Secondary | ICD-10-CM | POA: Diagnosis not present

## 2023-06-22 DIAGNOSIS — M6281 Muscle weakness (generalized): Secondary | ICD-10-CM | POA: Diagnosis not present

## 2023-06-22 DIAGNOSIS — R2681 Unsteadiness on feet: Secondary | ICD-10-CM

## 2023-06-22 NOTE — Therapy (Signed)
OUTPATIENT NEURO PHYSICAL THERAPY TREATMENT     Patient Name: Kendra [name] MRN: [id number] DOB:1946-06-29, 77 y.o., female Today's Date: 06/22/2023  PCP: Erasmo Downer, MD REFERRING PROVIDER: Erasmo Downer, MD  END OF SESSION:  PT End of Session - 06/22/23 1310     Visit Number 62    Number of Visits 68    Date for PT Re-Evaluation 08/05/23    Authorization Type [employer] PPO    Authorization Time Period 11/04/2022-12/30/2022    Progress Note Due on Visit 30    PT Start Time 1316    PT Stop Time 1357    PT Time Calculation (min) 41 min    Equipment Utilized During Treatment Gait belt    Activity Tolerance Patient tolerated treatment well;Patient limited by fatigue    Behavior During Therapy WFL for tasks assessed/performed                              Past Medical History:  Diagnosis Date   Anxiety    Breast cancer (HCC) 03/2018   right breast cancer at 11:00 and 1:00   Bursitis    bilateral hips and knees   Complication of anesthesia    Depression    Family history of breast cancer 03/24/2018   Family history of lung cancer 03/24/2018   History of alcohol dependence (HCC) 2007   no ETOH; resolved since 2007   History of hiatal hernia    History of psychosis 2014   due to sleep disturbance   Hypertension    Parkinson disease    Personal history of radiation therapy 06/2018-07/2018   right breast ca   PONV (postoperative nausea and vomiting)    Past Surgical History:  Procedure Laterality Date   BREAST BIOPSY Right 03/14/2018   11:00 DCIS and invasive ductal carcinoma   BREAST BIOPSY Right 03/14/2018   1:00 Invasive ductal carcinoma   BREAST LUMPECTOMY Right 04/27/2018   lumpectomy of 11 and 1:00 cancers, clear margins, negative LN   BREAST LUMPECTOMY WITH RADIOACTIVE SEED AND SENTINEL LYMPH NODE BIOPSY Right 04/27/2018   Procedure: RIGHT BREAST LUMPECTOMY WITH RADIOACTIVE SEED X 2 AND RIGHT SENTINEL LYMPH NODE  BIOPSY;  Surgeon: Glenna Fellows, MD;  Location: [facility];  Service: General;  Laterality: Right;   CATARACT EXTRACTION W/PHACO Left 08/30/2019   Procedure: CATARACT EXTRACTION PHACO AND INTRAOCULAR LENS PLACEMENT (IOC) LEFT panoptix toric  01:20.5  12.7%  10.26;  Surgeon: Lockie Mola, MD;  Location: [facility];  Service: Ophthalmology;  Laterality: Left;   CATARACT EXTRACTION W/PHACO Right 09/20/2019   Procedure: CATARACT EXTRACTION PHACO AND INTRAOCULAR LENS PLACEMENT (IOC) RIGHT 5.71  00:36.4  15.7%;  Surgeon: Lockie Mola, MD;  Location: [facility];  Service: Ophthalmology;  Laterality: Right;   LAPAROTOMY N/A 02/10/2018   Procedure: EXPLORATORY LAPAROTOMY;  Surgeon: Shonna Chock, MD;  Location: [facility];  Service: Gynecology;  Laterality: N/A;   MASS EXCISION  01/2018   abdominal   OVARIAN CYST REMOVAL     SALPINGOOPHORECTOMY Bilateral 02/10/2018   Procedure: BILATERAL SALPINGO OOPHORECTOMY; PERITONEAL WASHINGS;  Surgeon: Shonna Chock, MD;  Location: [facility];  Service: Gynecology;  Laterality: Bilateral;   TONSILLECTOMY     TONSILLECTOMY AND ADENOIDECTOMY  1953   Patient Active Problem List   Diagnosis Date Noted   Neurogenic bladder 05/14/2023   Prediabetes 12/15/2022   Chronic cough 08/17/2022   History of allergy to  shellfish 02/24/2022   Chronic low back pain of over 3 months duration 02/04/2022   Osteoarthritis of  AC (acromioclavicular) joint (Left) 02/04/2022   Osteoarthritis of hip (Right) 02/04/2022   Impaction fracture of distal end of radius and ulna, sequela (Left) 02/04/2022   Osteoarthritis of 1st (thumb) carpometacarpal (CMC) joint of hand (Left) 02/04/2022   Lumbosacral facet arthropathy (Multilevel) (Bilateral) (T12-S1) 02/04/2022   Lumbar central spinal stenosis w/o neurogenic claudication (L2-3, L3-4, L4-5) 02/04/2022   Lumbar foraminal stenosis (Bilateral: L2-3, L4-5) (Right: (Severe) L4-5) 02/04/2022    Lumbar lateral recess stenosis (Bilateral: L2-3, L3-4, and L4-5) (Severe) 02/04/2022   DDD (degenerative disc disease), lumbar 02/04/2022   Lumbosacral facet syndrome (Multilevel) (Bilateral) 02/04/2022   Osteoarthritis of lumbar spine 02/04/2022   Allergic conjunctivitis of both eyes 12/23/2021   Chronic hip pain (3ry area of Pain) (Bilateral) (L>R) 12/22/2021   Chronic lower extremity pain (2ry area of Pain) (Bilateral) (L>R) 12/22/2021   Abnormal MRI, lumbar spine (01/31/2020) 12/22/2021   Grade 1 Anterolisthesis of lumbar spine (L3/L4) & (5 mm) (L4/L5) 12/22/2021   Chronic pain syndrome 11/17/2021   High risk medication use 11/17/2021   Disorder of skeletal system 11/17/2021   Problems influencing health status 11/17/2021   Other hyperlipidemia 10/21/2021   Polypharmacy 08/12/2021   Chronic low back pain (1ry area of Pain) (Bilateral) (L>R) w/o sciatica 06/18/2021   Osteopenia of neck of femur (Right) 09/06/2020   Obesity 08/29/2020   Parkinson disease 02/22/2020   Mucinous cystadenoma 12/24/2019   Bipolar disorder, in full remission, most recent episode depressed (HCC) 11/08/2019   Vitamin D insufficiency 08/24/2019   Insomnia due to mental condition 05/31/2019   Bipolar I disorder, most recent episode depressed (HCC) 05/31/2019   Alcohol use disorder, moderate, in sustained remission (HCC) 05/31/2019   Elevated tumor markers 05/27/2019   GAD (generalized anxiety disorder) 04/10/2019   MDD (major depressive disorder), severe (HCC) 01/31/2019   High serum carbohydrate antigen 19-9 (CA19-9) 12/01/2018   Adjustment disorder with anxious mood 11/24/2018   Essential hypertension 11/24/2018   B12 deficiency 10/17/2018   Incisional hernia, without obstruction or gangrene 09/12/2018   Genetic testing 04/07/2018   Family history of breast cancer 03/24/2018   Family history of lung cancer 03/24/2018   Malignant neoplasm of upper-outer quadrant of right breast in female, estrogen  receptor positive (HCC) 03/17/2018   Malignant neoplasm of upper-inner quadrant of right breast in female, estrogen receptor positive (HCC) 03/17/2018   History of psychosis 02/07/2018   History of insomnia 02/07/2018    ONSET DATE: Pt diagnosed with PD in 2019  REFERRING DIAG:  G20 (ICD-10-CM) - Parkinson's disease      THERAPY DIAG:  Muscle weakness (generalized)  Difficulty in walking, not elsewhere classified  Pain in left hip  Unsteadiness on feet  Rationale for Evaluation and Treatment: Rehabilitation  SUBJECTIVE:  Pt reports no falls/stumbles, and only slight L hip pain. Pt reports she did her HEP.  Pain: none currently Pt accompanied by: significant other self, brought to session by caregiver  PERTINENT HISTORY:  Pt is a pleasant 77 yo female who was diagnosed with PD in 2019. She is known to clinic and [facility], and was last seen for LSVT BIG Sept-Oct 2022. Pt with chronic pain and has become increasingly sedentary as a result exacerbating mobility difficulties due to PD. Current concerns: Pt reports L and R hip pain and back pain. Pt was being seen by pain management clinic. Pt has had injections that provided relief  at the time. Pt's spouse reports her physician determined surgery was not an option for her pain so the plan is to manage it. Pt spouse thinks pain has resulted in reluctance for pt to move. Pt currently sedentary, doesn't walk much, but when she does she holds on to furniture for support due to balance problems. When pt first stands she must hold on to something due to mix of pain and/or dizziness (described as lightheadedness). Her pain can go from 3/10 to 9/10. It does improve once she starts moving. Pt has increased difficulty with multiple activities including: getting in and out of her bed, getting in and out of her car, freezing, decreased gait speed, decreased balance and cognition, and difficulty using BUE due to tremors.  Per chart other PMH includes hx  of breast cancer, HTN, MCI, insomnia, cataracts, bipolar 1, alcohol use disorder, GAD, MDD, osteopenia of neck of R femur, chronic B low back pain with radiating pain to buttock and posterior thighs and calves. Pt with multiple comorbidities, please refer to chart for full details    PAIN: from eval Are you having pain? Yes: NPRS scale: not rated currently but can reach 9/10 Pain location: L hip and L leg Pain description: pt has difficulty describing Aggravating factors: unsure Relieving factors: laying in the recliner helps, movement  PRECAUTIONS: Fall  WEIGHT BEARING RESTRICTIONS: No  FALLS: Has patient fallen in last 6 months? Yes. Number of falls 1x, leg tangled in chair leg when getting up from a chair  PLOF: Independent with basic ADLs  PATIENT GOALS: improve mobility   BLE Strength Assessment      Right  Left  Hip Flexion 5/5 4/5 (painful)  Hip Horizontal ABDCT  5/5 5/5  Hip Horizontal ADD 4+/5 4+/5  Hip IR (seated)  4+/5 4+/5  Hip ER (seated) 5/5 5/5  Knee Extension 5/5 5/5  Knee Flexion (seated) 4+/5  5/5 (not painful)  Hip ABDCT (supine)  3+/5 4-/5   Focal popliteal edema Left knee, not painful   ROM Assessment Hip     Right  Left   Flexion Supine 132 120 (ouch)  External rotation (90/90) 30 47  Internal Rotation (90/90) 40  20  Supine Hip Extension P/ROM >0 degrees >0 degrees       IMAGING: via chart   07/24/22 DG BONE density " ASSESSMENT: The probability of a major osteoporotic fracture is 15.8% within the next ten years.   The probability of a hip fracture is 2.9% within the next ten years."   12/22/21 DG lumbar spine " IMPRESSION: 1. No acute displaced fracture or traumatic listhesis of the lumbar spine. 2. Interval development of grade 1 anterolisthesis of L3 on L4. Stable grade 1 anterolisthesis of L4 on L5. Appears stable on flexion extension views. 3.  Aortic Atherosclerosis (ICD10-I70.0)."   New from chart:  DG Lumbar spine 01/05/23:  "IMPRESSION: 1. Lumbar spine scoliosis concave right. Diffuse multilevel degenerative change. Disc degeneration most prominent at L2-L3 and L5-S1. 5 mm anterolisthesis L4 on L5. Diffuse severe facet hypertrophy. No flexion or extension abnormality identified. No evidence of fracture. 2. Sclerotic densities are noted in the L2 and L4 vertebral bodies. Although these could represent bone islands blastic metastatic disease cannot be excluded. MRI of the lumbar spine suggested for further evaluation. 3. Large amount of stool noted throughout the colon. 4. Aortoiliac atherosclerotic vascular disease."    DG hip 01/05/23: "IMPRESSION: Moderate bilateral femoroacetabular osteoarthritis, similar to prior."   PATIENT EDUCATION:  Education  details: Pt educated throughout session about proper posture and technique with exercises. Improved exercise technique, movement at target joints, use of target muscles after min to mod verbal, visual, tactile cues.  Person educated: Patient and Spouse Education method: Explanation, Demonstration, and Verbal cues Education comprehension: verbalized understanding, returned demonstration, verbal cues required, tactile cues required, and needs further education   TODAY'S TREATMENT:                                                                                                                                         DATE: 06/22/23   Gait belt donned and CGA provided throughout unless specified otherwise   Circuit training incorporates both TE and NMR   Circuit 1: Cuing for increased speed with the following Ambulate 148 ft with rollator with CGA - completes in 82 seconds.  STS 5x - completes in 19 sec  LAQ 10x each LE  Ambulate 148 ft with rollator with CGA - completes in 80 seconds.   Circuit 2: Cuing for increased speed with the following Ambulate 148 ft with rollator with CGA - completes in 70 seconds.  STS 5x - completes in.8 sec  LAQ 15x each LE   Seated march 15x each LE Heel raise 20x bilat LE Ambulate 148 ft with rollator with CGA - completes in 80 seconds  Circuit 3: Ambulate 148 ft with rollator with CGA x 70 sec  Seated step-outs 10x each direction  Seated DF 20x bilat    Printout with info on community [employer]WR! Classes for PD including contact info for fitness center  Instructed through UPDATED HEP: Seated LAQ 12x each LE  Seated heel raise 20x bilat LE STS 2x10    HOME EXERCISE PROGRAM:   8//20/24: Access Code: [email or web address] URL: [email or web address] Date: 06/22/2023 Prepared by: Temple Pacini  Exercises - Sit to Stand with Counter Support  - 1 x daily - 4-6 x weekly - 2 sets - 10 reps - Seated Long Arc Quad  - 1 x daily - 4-6 x weekly - 2 sets - 12 reps - Seated Heel Raise  - 1 x daily - 4-6 x weekly - 1 sets - 20 reps   Access Code: [email or web address] URL: [email or web address] Date: 12/22/2022 Prepared by: Temple Pacini  Exercises - Sit to Stand with Counter Support  - 1 x daily - 5 x weekly - 3 sets - 10 reps  GOALS: Goals reviewed with patient? Yes  SHORT TERM GOALS: Target date: 04/27/2023    Patient will be independent in home exercise program to improve strength/mobility for better functional independence with ADLs. Baseline: LSVT BIG protocol to be initiated next session; 12/18: not yet fully indep, reports she still gets confused about the technique; 12/03/2022: HEP to be updated; Pt reports practicing HEP 2x/week; 02/23/23: not currently performing HEP, reviewed in session and provided new copy; 03/16/2023: pt reports performing HEP every day; 03/30/23: Pt reports  she is not being consistent with her HEP; 7/1: pt reports she is not doing much at home; 06/10/23: pt unsure how often she is exercising at home Goal status: MET (previously MET)   LONG TERM GOALS: Target date: 06/08/2023     Patient will increase FOTO score to equal to or greater than 49 to demonstrate statistically  significant improvement in mobility and quality of life.  Baseline: 40; 12/18: deferred due to time; 45; 3/19: deferred to next visit; 01/21/2023; 02/23/2023: 46 unchanged from previous assessment); 03/17/2023: 48; 06/10/23: 52 Goal status: MET  2.  Patient (> 36 years old) will complete five times sit to stand test in < 15 seconds indicating decreased fall risk.  Baseline: 27 sec; 12/18 23 sec hands-free; 11/04/22: 24 sec hands free; 2/1/124: 22.7 sec use of BUE, 21.3 sec hands-free; 01/19/23: 18 seconds hands-free; 02/23/23: 19 seconds hands-free; 03/16/2023: 18 seconds hands-free; 03/30/23: 17.3 seconds hands-free; 05/03/23: 17.3 seconds hands-free; 06/10/23: 19 seconds hands-free Goal status: IN PROGRESS  3.  Patient will increase 10 meter walk test to >1.39m/s with WFL B step-length, upright posture for >90% of test to improve ease with gait and to override hypokentic and bradykinetic movement.  Baseline: 0.58 m/s with 1OX; 12/18: 0.7 m/s with 0RU; 11/04/22: 0.69 m/s with 0AV;  12/03/22: 0.73 m/s with 4UJ; 01/19/2023: 0.77 m/s with 8JX; 02/23/2023: 0.83 m/s with 9JY; 03/16/2023: 0.83 m/s with 7WG , pt still somewhat crouched, requires cues for upright posture >50% of time; 03/30/23: 0.83 m/s with crouched posture; 05/03/23 0.8 m/s with RW, some improvement with upright posture; 06/10/23:  0.77 m/s with 4WW and crouched posture  Goal status: IN PROGRESS  4.  Patient will reduce time to don jacket by at least 5 seconds in order to increase ease with dressing and override bradykinetic movement Baseline: 17 seconds; 12/18: 12 seconds Goal status: MET  5.  Pt will demo or report ability to open standard jar using only 1-2 attempts in order to exhibit improved functional mobility for ADLs.  Baseline: 5.7 seconds with multiple attempts to turn jar lid to open; 12/18: 2.8 sec opens in 2 turns Goal status: MET  6.  Patient will increase Berg Balance score by > 6 points to demonstrate decreased fall risk during functional  activities. Baseline: to be tested next 1-2 visits; 12/03/2022: 44/56; 01/21/23 45/56; 48/56; 03/17/2023: 48/56; 05/03/23: 50/56; 8/8: 48/56 Goal status: Partially met   7.  Patient will report at least 4 days in a row with minimal to no L hip pain in order to improve QOL and ease/safety with functional mobility. Baseline: Pt reports pain majority of days in L hip; 12/03/2022: pt reports having hip pain about every day; 3/19: rates hip pain daily (being seen now by MD, new imaging); 02/23/2023: pt went for period of time without hip pain (she thinks a week), but reports it has now come back,; 5/28: pt reports hip pain "every other day"; 05/03/23: pt reports pain in hip every day in past week Goal status: NOT MET/discontinued   8. Patient will increase six minute walk test distance to >1000 for progression to community ambulator and improve gait ability Baseline:02/23/2023: 722 ft with 4WW and two standing rest breaks, rates test as hard; 03/16/2023: deferred to next visit due to time; 5/28/20204: 641 ft with 4WW, pt requests to end test early due to fatigue at 5 min 35 sec; 05/03/23: 645 ft with 4WW and able to complete full test; 06/10/23: 708 ft Goal status: IN PROGRESS  ASSESSMENT: PT updated HEP today as pt reports performing her previous HEP this past week. PT also continued to provide encouragement and reasoning for increased time ambulating during the day as well. PT provided pt with information handout regarding community PD PWR! Classes with contact info. Patient's condition has the potential to improve in response to therapy. Maximum improvement is yet to be obtained.The pt will benefit from further skilled PT to improve strength, balance, gait and mobility.  OBJECTIVE IMPAIRMENTS: Abnormal gait, decreased balance, decreased cognition, decreased coordination, decreased endurance, decreased knowledge of use of DME, decreased mobility, difficulty walking, decreased strength, dizziness, hypomobility, impaired  UE functional use, improper body mechanics, postural dysfunction, and pain.   ACTIVITY LIMITATIONS: lifting, bending, standing, squatting, stairs, transfers, bed mobility, bathing, dressing, hygiene/grooming, and locomotion level  PARTICIPATION LIMITATIONS: meal prep, cleaning, laundry, medication management, personal finances, driving, shopping, community activity, and yard work  PERSONAL FACTORS: Age, Fitness, Sex, Time since onset of injury/illness/exacerbation, and 3+ comorbidities: PMH includes hx of breast cancer, HTN, MCI, insomnia, cataracts, bipolar 1, alcohol use disorder, GAD, MDD, osteopenia of neck of R femur, chronic B low back pain with radiating pain to buttock and posterior thighs and calves. Pt with multiple comorbidities, please refer to chart for full details  are also affecting patient's functional outcome.   REHAB POTENTIAL: Fair    CLINICAL DECISION MAKING: Evolving/moderate complexity  EVALUATION COMPLEXITY: High  PLAN:  PT FREQUENCY: 1-2x/week  PT DURATION: 8 weeks  PLANNED INTERVENTIONS: Therapeutic exercises, Therapeutic activity, Neuromuscular re-education, Balance training, Gait training, Patient/Family education, Self Care, Joint mobilization, Stair training, Vestibular training, Canalith repositioning, Visual/preceptual remediation/compensation, Orthotic/Fit training, DME instructions, Wheelchair mobility training, Spinal mobilization, Cryotherapy, Moist heat, Splintting, Taping, and Manual therapy  PLAN FOR NEXT SESSION: hamtrings loading, hip strength, mobility, endurance, gait and balance, cardio, bed mobility   2:33 PM, 06/22/23    Baird Kay, PT 06/22/2023, 2:33 PM  Physical Therapist - [facility]  Outpatient Physical Therapy- Main Campus [phone]

## 2023-06-24 ENCOUNTER — Ambulatory Visit: Payer: Medicare PPO

## 2023-06-29 ENCOUNTER — Ambulatory Visit: Payer: Medicare PPO

## 2023-06-29 DIAGNOSIS — R2681 Unsteadiness on feet: Secondary | ICD-10-CM

## 2023-06-29 DIAGNOSIS — R42 Dizziness and giddiness: Secondary | ICD-10-CM | POA: Diagnosis not present

## 2023-06-29 DIAGNOSIS — R262 Difficulty in walking, not elsewhere classified: Secondary | ICD-10-CM

## 2023-06-29 DIAGNOSIS — M25552 Pain in left hip: Secondary | ICD-10-CM | POA: Diagnosis not present

## 2023-06-29 DIAGNOSIS — R278 Other lack of coordination: Secondary | ICD-10-CM

## 2023-06-29 DIAGNOSIS — M6281 Muscle weakness (generalized): Secondary | ICD-10-CM | POA: Diagnosis not present

## 2023-06-29 NOTE — Therapy (Signed)
OUTPATIENT NEURO PHYSICAL THERAPY TREATMENT     Patient Name: Kendra [name] MRN: [id number] DOB:Oct 28, 1946, 77 y.o., female Today's Date: 06/29/2023  PCP: Erasmo Downer, MD REFERRING PROVIDER: Erasmo Downer, MD  END OF SESSION:  PT End of Session - 06/29/23 1305     Visit Number 63    Number of Visits 68    Date for PT Re-Evaluation 08/05/23    Authorization Type [employer] PPO    Authorization Time Period 11/04/2022-12/30/2022    Progress Note Due on Visit 30    PT Start Time 1314    PT Stop Time 1358    PT Time Calculation (min) 44 min    Equipment Utilized During Treatment Gait belt    Activity Tolerance Patient tolerated treatment well;Patient limited by fatigue    Behavior During Therapy WFL for tasks assessed/performed                              Past Medical History:  Diagnosis Date   Anxiety    Breast cancer (HCC) 03/2018   right breast cancer at 11:00 and 1:00   Bursitis    bilateral hips and knees   Complication of anesthesia    Depression    Family history of breast cancer 03/24/2018   Family history of lung cancer 03/24/2018   History of alcohol dependence (HCC) 2007   no ETOH; resolved since 2007   History of hiatal hernia    History of psychosis 2014   due to sleep disturbance   Hypertension    Parkinson disease    Personal history of radiation therapy 06/2018-07/2018   right breast ca   PONV (postoperative nausea and vomiting)    Past Surgical History:  Procedure Laterality Date   BREAST BIOPSY Right 03/14/2018   11:00 DCIS and invasive ductal carcinoma   BREAST BIOPSY Right 03/14/2018   1:00 Invasive ductal carcinoma   BREAST LUMPECTOMY Right 04/27/2018   lumpectomy of 11 and 1:00 cancers, clear margins, negative LN   BREAST LUMPECTOMY WITH RADIOACTIVE SEED AND SENTINEL LYMPH NODE BIOPSY Right 04/27/2018   Procedure: RIGHT BREAST LUMPECTOMY WITH RADIOACTIVE SEED X 2 AND RIGHT SENTINEL LYMPH NODE  BIOPSY;  Surgeon: Glenna Fellows, MD;  Location: [facility];  Service: General;  Laterality: Right;   CATARACT EXTRACTION W/PHACO Left 08/30/2019   Procedure: CATARACT EXTRACTION PHACO AND INTRAOCULAR LENS PLACEMENT (IOC) LEFT panoptix toric  01:20.5  12.7%  10.26;  Surgeon: Lockie Mola, MD;  Location: [facility];  Service: Ophthalmology;  Laterality: Left;   CATARACT EXTRACTION W/PHACO Right 09/20/2019   Procedure: CATARACT EXTRACTION PHACO AND INTRAOCULAR LENS PLACEMENT (IOC) RIGHT 5.71  00:36.4  15.7%;  Surgeon: Lockie Mola, MD;  Location: [facility];  Service: Ophthalmology;  Laterality: Right;   LAPAROTOMY N/A 02/10/2018   Procedure: EXPLORATORY LAPAROTOMY;  Surgeon: Shonna Chock, MD;  Location: [facility];  Service: Gynecology;  Laterality: N/A;   MASS EXCISION  01/2018   abdominal   OVARIAN CYST REMOVAL     SALPINGOOPHORECTOMY Bilateral 02/10/2018   Procedure: BILATERAL SALPINGO OOPHORECTOMY; PERITONEAL WASHINGS;  Surgeon: Shonna Chock, MD;  Location: [facility];  Service: Gynecology;  Laterality: Bilateral;   TONSILLECTOMY     TONSILLECTOMY AND ADENOIDECTOMY  1953   Patient Active Problem List   Diagnosis Date Noted   Neurogenic bladder 05/14/2023   Prediabetes 12/15/2022   Chronic cough 08/17/2022   History of allergy to  shellfish 02/24/2022   Chronic low back pain of over 3 months duration 02/04/2022   Osteoarthritis of  AC (acromioclavicular) joint (Left) 02/04/2022   Osteoarthritis of hip (Right) 02/04/2022   Impaction fracture of distal end of radius and ulna, sequela (Left) 02/04/2022   Osteoarthritis of 1st (thumb) carpometacarpal (CMC) joint of hand (Left) 02/04/2022   Lumbosacral facet arthropathy (Multilevel) (Bilateral) (T12-S1) 02/04/2022   Lumbar central spinal stenosis w/o neurogenic claudication (L2-3, L3-4, L4-5) 02/04/2022   Lumbar foraminal stenosis (Bilateral: L2-3, L4-5) (Right: (Severe) L4-5) 02/04/2022    Lumbar lateral recess stenosis (Bilateral: L2-3, L3-4, and L4-5) (Severe) 02/04/2022   DDD (degenerative disc disease), lumbar 02/04/2022   Lumbosacral facet syndrome (Multilevel) (Bilateral) 02/04/2022   Osteoarthritis of lumbar spine 02/04/2022   Allergic conjunctivitis of both eyes 12/23/2021   Chronic hip pain (3ry area of Pain) (Bilateral) (L>R) 12/22/2021   Chronic lower extremity pain (2ry area of Pain) (Bilateral) (L>R) 12/22/2021   Abnormal MRI, lumbar spine (01/31/2020) 12/22/2021   Grade 1 Anterolisthesis of lumbar spine (L3/L4) & (5 mm) (L4/L5) 12/22/2021   Chronic pain syndrome 11/17/2021   High risk medication use 11/17/2021   Disorder of skeletal system 11/17/2021   Problems influencing health status 11/17/2021   Other hyperlipidemia 10/21/2021   Polypharmacy 08/12/2021   Chronic low back pain (1ry area of Pain) (Bilateral) (L>R) w/o sciatica 06/18/2021   Osteopenia of neck of femur (Right) 09/06/2020   Obesity 08/29/2020   Parkinson disease 02/22/2020   Mucinous cystadenoma 12/24/2019   Bipolar disorder, in full remission, most recent episode depressed (HCC) 11/08/2019   Vitamin D insufficiency 08/24/2019   Insomnia due to mental condition 05/31/2019   Bipolar I disorder, most recent episode depressed (HCC) 05/31/2019   Alcohol use disorder, moderate, in sustained remission (HCC) 05/31/2019   Elevated tumor markers 05/27/2019   GAD (generalized anxiety disorder) 04/10/2019   MDD (major depressive disorder), severe (HCC) 01/31/2019   High serum carbohydrate antigen 19-9 (CA19-9) 12/01/2018   Adjustment disorder with anxious mood 11/24/2018   Essential hypertension 11/24/2018   B12 deficiency 10/17/2018   Incisional hernia, without obstruction or gangrene 09/12/2018   Genetic testing 04/07/2018   Family history of breast cancer 03/24/2018   Family history of lung cancer 03/24/2018   Malignant neoplasm of upper-outer quadrant of right breast in female, estrogen  receptor positive (HCC) 03/17/2018   Malignant neoplasm of upper-inner quadrant of right breast in female, estrogen receptor positive (HCC) 03/17/2018   History of psychosis 02/07/2018   History of insomnia 02/07/2018    ONSET DATE: Pt diagnosed with PD in 2019  REFERRING DIAG:  G20 (ICD-10-CM) - Parkinson's disease      THERAPY DIAG:  Muscle weakness (generalized)  Difficulty in walking, not elsewhere classified  Unsteadiness on feet  Other lack of coordination  Rationale for Evaluation and Treatment: Rehabilitation  SUBJECTIVE:  Pt reports no falls/stumbles. She reports some minor L hip pain currently. She is unsure about her HEP, remembers STS but not sure what the other exercises are. Unclear if pt performing HEP.  Pain: L hip pain Pt accompanied by: self   PERTINENT HISTORY:  Pt is a pleasant 77 yo female who was diagnosed with PD in 2019. She is known to clinic and [facility], and was last seen for LSVT BIG Sept-Oct 2022. Pt with chronic pain and has become increasingly sedentary as a result exacerbating mobility difficulties due to PD. Current concerns: Pt reports L and R hip pain and back pain. Pt was being  seen by pain management clinic. Pt has had injections that provided relief at the time. Pt's spouse reports her physician determined surgery was not an option for her pain so the plan is to manage it. Pt spouse thinks pain has resulted in reluctance for pt to move. Pt currently sedentary, doesn't walk much, but when she does she holds on to furniture for support due to balance problems. When pt first stands she must hold on to something due to mix of pain and/or dizziness (described as lightheadedness). Her pain can go from 3/10 to 9/10. It does improve once she starts moving. Pt has increased difficulty with multiple activities including: getting in and out of her bed, getting in and out of her car, freezing, decreased gait speed, decreased balance and cognition, and  difficulty using BUE due to tremors.  Per chart other PMH includes hx of breast cancer, HTN, MCI, insomnia, cataracts, bipolar 1, alcohol use disorder, GAD, MDD, osteopenia of neck of R femur, chronic B low back pain with radiating pain to buttock and posterior thighs and calves. Pt with multiple comorbidities, please refer to chart for full details    PAIN: from eval Are you having pain? Yes: NPRS scale: not rated currently but can reach 9/10 Pain location: L hip and L leg Pain description: pt has difficulty describing Aggravating factors: unsure Relieving factors: laying in the recliner helps, movement  PRECAUTIONS: Fall  WEIGHT BEARING RESTRICTIONS: No  FALLS: Has patient fallen in last 6 months? Yes. Number of falls 1x, leg tangled in chair leg when getting up from a chair  PLOF: Independent with basic ADLs  PATIENT GOALS: improve mobility   BLE Strength Assessment      Right  Left  Hip Flexion 5/5 4/5 (painful)  Hip Horizontal ABDCT  5/5 5/5  Hip Horizontal ADD 4+/5 4+/5  Hip IR (seated)  4+/5 4+/5  Hip ER (seated) 5/5 5/5  Knee Extension 5/5 5/5  Knee Flexion (seated) 4+/5  5/5 (not painful)  Hip ABDCT (supine)  3+/5 4-/5   Focal popliteal edema Left knee, not painful   ROM Assessment Hip     Right  Left   Flexion Supine 132 120 (ouch)  External rotation (90/90) 30 47  Internal Rotation (90/90) 40  20  Supine Hip Extension P/ROM >0 degrees >0 degrees       IMAGING: via chart   07/24/22 DG BONE density " ASSESSMENT: The probability of a major osteoporotic fracture is 15.8% within the next ten years.   The probability of a hip fracture is 2.9% within the next ten years."   12/22/21 DG lumbar spine " IMPRESSION: 1. No acute displaced fracture or traumatic listhesis of the lumbar spine. 2. Interval development of grade 1 anterolisthesis of L3 on L4. Stable grade 1 anterolisthesis of L4 on L5. Appears stable on flexion extension views. 3.  Aortic  Atherosclerosis (ICD10-I70.0)."   New from chart:  DG Lumbar spine 01/05/23: "IMPRESSION: 1. Lumbar spine scoliosis concave right. Diffuse multilevel degenerative change. Disc degeneration most prominent at L2-L3 and L5-S1. 5 mm anterolisthesis L4 on L5. Diffuse severe facet hypertrophy. No flexion or extension abnormality identified. No evidence of fracture. 2. Sclerotic densities are noted in the L2 and L4 vertebral bodies. Although these could represent bone islands blastic metastatic disease cannot be excluded. MRI of the lumbar spine suggested for further evaluation. 3. Large amount of stool noted throughout the colon. 4. Aortoiliac atherosclerotic vascular disease."    DG hip 01/05/23: "IMPRESSION: Moderate  bilateral femoroacetabular osteoarthritis, similar to prior."   PATIENT EDUCATION:  Education details: Pt educated throughout session about proper posture and technique with exercises. Improved exercise technique, movement at target joints, use of target muscles after min to mod verbal, visual, tactile cues.  Person educated: Patient and Spouse Education method: Explanation, Demonstration, and Verbal cues Education comprehension: verbalized understanding, returned demonstration, verbal cues required, tactile cues required, and needs further education   TODAY'S TREATMENT:                                                                                                                                         DATE: 06/29/23   Gait belt donned and CGA provided throughout unless specified otherwise  Review of HEP STS 2x10 Rates medium Seated LAQ 2x12 each LE Seated heel raise 20x bilat - cuing to attend to task. Pt reports intervention is tough   Circuit training incorporates both TE and NMR  2# AW donned for the following:   Circuit 1: Ambulate 296 ft with rollator with CGA  - pt requests rest break STS 10x Ambulate 444 ft with rollator with CGA - PT provided  encouragement throughout to increase distance ambulated STS 5x  Circuit 2: Seated adductor squeeze with pball 15x bilat LE  LAQ with 3# AW 10x each LE Large amplitude seated twist and reach 10x each side  Seated step-up to 6" block with 3# aw 10x each LE Large amplitude reach down and up 10x. Pt reports intervention is fatiguing  Circuit 3: Ambulate 148 ft with rollator with CGA  STS 5x Ambulate 148 ft with rollator with CGA  STS 5x Ambulate 148 ft with rollator with CGA  STS 5x  Pt provides new printout of HEP issued last visit per pt request.    HOME EXERCISE PROGRAM:   8//20/24: Access Code: [email or web address] URL: [email or web address] Date: 06/22/2023 Prepared by: Temple Pacini  Exercises - Sit to Stand with Counter Support  - 1 x daily - 4-6 x weekly - 2 sets - 10 reps - Seated Long Arc Quad  - 1 x daily - 4-6 x weekly - 2 sets - 12 reps - Seated Heel Raise  - 1 x daily - 4-6 x weekly - 1 sets - 20 reps   Access Code: [email or web address] URL: [email or web address] Date: 12/22/2022 Prepared by: Temple Pacini  Exercises - Sit to Stand with Counter Support  - 1 x daily - 5 x weekly - 3 sets - 10 reps  GOALS: Goals reviewed with patient? Yes  SHORT TERM GOALS: Target date: 04/27/2023    Patient will be independent in home exercise program to improve strength/mobility for better functional independence with ADLs. Baseline: LSVT BIG protocol to be initiated next session; 12/18: not yet fully indep, reports she still gets confused about the technique; 12/03/2022: HEP to be updated; Pt reports practicing HEP 2x/week; 02/23/23: not currently performing HEP, reviewed in  session and provided new copy; 03/16/2023: pt reports performing HEP every day; 03/30/23: Pt reports she is not being consistent with her HEP; 7/1: pt reports she is not doing much at home; 06/10/23: pt unsure how often she is exercising at home Goal status: MET (previously MET)   LONG TERM GOALS: Target  date: 06/08/2023     Patient will increase FOTO score to equal to or greater than 49 to demonstrate statistically significant improvement in mobility and quality of life.  Baseline: 40; 12/18: deferred due to time; 45; 3/19: deferred to next visit; 01/21/2023; 02/23/2023: 46 unchanged from previous assessment); 03/17/2023: 48; 06/10/23: 52 Goal status: MET  2.  Patient (> 58 years old) will complete five times sit to stand test in < 15 seconds indicating decreased fall risk.  Baseline: 27 sec; 12/18 23 sec hands-free; 11/04/22: 24 sec hands free; 2/1/124: 22.7 sec use of BUE, 21.3 sec hands-free; 01/19/23: 18 seconds hands-free; 02/23/23: 19 seconds hands-free; 03/16/2023: 18 seconds hands-free; 03/30/23: 17.3 seconds hands-free; 05/03/23: 17.3 seconds hands-free; 06/10/23: 19 seconds hands-free Goal status: IN PROGRESS  3.  Patient will increase 10 meter walk test to >1.82m/s with WFL B step-length, upright posture for >90% of test to improve ease with gait and to override hypokentic and bradykinetic movement.  Baseline: 0.58 m/s with 8CZ; 12/18: 0.7 m/s with 6SA; 11/04/22: 0.69 m/s with 6TK;  12/03/22: 0.73 m/s with 1SW; 01/19/2023: 0.77 m/s with 1UX; 02/23/2023: 0.83 m/s with 3AT; 03/16/2023: 0.83 m/s with 5TD , pt still somewhat crouched, requires cues for upright posture >50% of time; 03/30/23: 0.83 m/s with crouched posture; 05/03/23 0.8 m/s with RW, some improvement with upright posture; 06/10/23:  0.77 m/s with 4WW and crouched posture  Goal status: IN PROGRESS  4.  Patient will reduce time to don jacket by at least 5 seconds in order to increase ease with dressing and override bradykinetic movement Baseline: 17 seconds; 12/18: 12 seconds Goal status: MET  5.  Pt will demo or report ability to open standard jar using only 1-2 attempts in order to exhibit improved functional mobility for ADLs.  Baseline: 5.7 seconds with multiple attempts to turn jar lid to open; 12/18: 2.8 sec opens in 2 turns Goal status:  MET  6.  Patient will increase Berg Balance score by > 6 points to demonstrate decreased fall risk during functional activities. Baseline: to be tested next 1-2 visits; 12/03/2022: 44/56; 01/21/23 45/56; 48/56; 03/17/2023: 48/56; 05/03/23: 50/56; 8/8: 48/56 Goal status: Partially met   7.  Patient will report at least 4 days in a row with minimal to no L hip pain in order to improve QOL and ease/safety with functional mobility. Baseline: Pt reports pain majority of days in L hip; 12/03/2022: pt reports having hip pain about every day; 3/19: rates hip pain daily (being seen now by MD, new imaging); 02/23/2023: pt went for period of time without hip pain (she thinks a week), but reports it has now come back,; 5/28: pt reports hip pain "every other day"; 05/03/23: pt reports pain in hip every day in past week Goal status: NOT MET/discontinued   8. Patient will increase six minute walk test distance to >1000 for progression to community ambulator and improve gait ability Baseline:02/23/2023: 722 ft with 4WW and two standing rest breaks, rates test as hard; 03/16/2023: deferred to next visit due to time; 5/28/20204: 641 ft with 4WW, pt requests to end test early due to fatigue at 5 min 35 sec; 05/03/23: 645 ft  with [phone] and able to complete full test; 06/10/23: 708 ft Goal status: IN PROGRESS  ASSESSMENT: PT reviews HEP with pt this session and pt reports she is unsure what her exercises are. Additionally, PT provides another copy of HEP. Pt able to demo correct technique with review.  Pt requires encouragement throughout session for completion of interventions. Pt reports fatigue throughout and that she started her day very early. Will continue to monitor fatigue levels. The pt will benefit from further skilled PT to improve strength, balance, gait and mobility.  OBJECTIVE IMPAIRMENTS: Abnormal gait, decreased balance, decreased cognition, decreased coordination, decreased endurance, decreased knowledge of use of DME,  decreased mobility, difficulty walking, decreased strength, dizziness, hypomobility, impaired UE functional use, improper body mechanics, postural dysfunction, and pain.   ACTIVITY LIMITATIONS: lifting, bending, standing, squatting, stairs, transfers, bed mobility, bathing, dressing, hygiene/grooming, and locomotion level  PARTICIPATION LIMITATIONS: meal prep, cleaning, laundry, medication management, personal finances, driving, shopping, community activity, and yard [name]  PERSONAL FACTORS: Age, Fitness, Sex, Time since onset of injury/illness/exacerbation, and 3+ comorbidities: PMH includes hx of breast cancer, HTN, MCI, insomnia, cataracts, bipolar 1, alcohol use disorder, GAD, MDD, osteopenia of neck of R femur, chronic B low back pain with radiating pain to buttock and posterior thighs and calves. Pt with multiple comorbidities, please refer to chart for full details  are also affecting patient's functional outcome.   REHAB POTENTIAL: Fair    CLINICAL DECISION MAKING: Evolving/moderate complexity  EVALUATION COMPLEXITY: High  PLAN:  PT FREQUENCY: 1-2x/week  PT DURATION: 8 weeks  PLANNED INTERVENTIONS: Therapeutic exercises, Therapeutic activity, Neuromuscular re-education, Balance training, Gait training, Patient/Family education, Self Care, Joint mobilization, Stair training, Vestibular training, Canalith repositioning, Visual/preceptual remediation/compensation, Orthotic/Fit training, DME instructions, Wheelchair mobility training, Spinal mobilization, Cryotherapy, Moist heat, Splintting, Taping, and Manual therapy  PLAN FOR NEXT SESSION: hamtrings loading, hip strength, mobility, endurance, gait and balance, cardio, bed mobility   2:13 PM, 06/29/23    Baird Kay, PT 06/29/2023, 2:13 PM  Physical Therapist - [facility]  Outpatient Physical Therapy- Main Campus [phone]

## 2023-07-01 ENCOUNTER — Ambulatory Visit: Payer: Medicare PPO

## 2023-07-06 ENCOUNTER — Ambulatory Visit: Payer: Medicare PPO | Attending: Family Medicine

## 2023-07-06 DIAGNOSIS — G20C Parkinsonism, unspecified: Secondary | ICD-10-CM | POA: Diagnosis not present

## 2023-07-06 DIAGNOSIS — R2681 Unsteadiness on feet: Secondary | ICD-10-CM | POA: Diagnosis not present

## 2023-07-06 DIAGNOSIS — R262 Difficulty in walking, not elsewhere classified: Secondary | ICD-10-CM

## 2023-07-06 DIAGNOSIS — M25552 Pain in left hip: Secondary | ICD-10-CM | POA: Diagnosis not present

## 2023-07-06 DIAGNOSIS — M6281 Muscle weakness (generalized): Secondary | ICD-10-CM | POA: Diagnosis not present

## 2023-07-06 DIAGNOSIS — R278 Other lack of coordination: Secondary | ICD-10-CM | POA: Diagnosis not present

## 2023-07-06 NOTE — Therapy (Signed)
OUTPATIENT PHYSICAL THERAPY TREATMENT     Patient Name: Kendra [name] MRN: [id number] DOB:[birth year], 77 y.o., female Today's Date: 07/06/2023  PCP: Erasmo Downer, MD REFERRING PROVIDER: Erasmo Downer, MD  END OF SESSION:  PT End of Session - 07/06/23 1157     Visit Number 64    Number of Visits 68    Date for PT Re-Evaluation 08/05/23    Authorization Type [employer] PPO    Authorization Time Period 06/10/23-08/05/23    Progress Note Due on Visit 70                              Past Medical History:  Diagnosis Date   Anxiety    Breast cancer (HCC) 03/2018   right breast cancer at 11:00 and 1:00   Bursitis    bilateral hips and knees   Complication of anesthesia    Depression    Family history of breast cancer 03/24/2018   Family history of lung cancer 03/24/2018   History of alcohol dependence (HCC) 2007   no ETOH; resolved since 2007   History of hiatal hernia    History of psychosis 2014   due to sleep disturbance   Hypertension    Parkinson disease    Personal history of radiation therapy 06/2018-07/2018   right breast ca   PONV (postoperative nausea and vomiting)    Past Surgical History:  Procedure Laterality Date   BREAST BIOPSY Right 03/14/2018   11:00 DCIS and invasive ductal carcinoma   BREAST BIOPSY Right 03/14/2018   1:00 Invasive ductal carcinoma   BREAST LUMPECTOMY Right 04/27/2018   lumpectomy of 11 and 1:00 cancers, clear margins, negative LN   BREAST LUMPECTOMY WITH RADIOACTIVE SEED AND SENTINEL LYMPH NODE BIOPSY Right 04/27/2018   Procedure: RIGHT BREAST LUMPECTOMY WITH RADIOACTIVE SEED X 2 AND RIGHT SENTINEL LYMPH NODE BIOPSY;  Surgeon: Glenna Fellows, MD;  Location: [facility];  Service: General;  Laterality: Right;   CATARACT EXTRACTION W/PHACO Left 08/30/2019   Procedure: CATARACT EXTRACTION PHACO AND INTRAOCULAR LENS PLACEMENT (IOC) LEFT panoptix toric  01:20.5  12.7%  10.26;   Surgeon: Lockie Mola, MD;  Location: [facility];  Service: Ophthalmology;  Laterality: Left;   CATARACT EXTRACTION W/PHACO Right 09/20/2019   Procedure: CATARACT EXTRACTION PHACO AND INTRAOCULAR LENS PLACEMENT (IOC) RIGHT 5.71  00:36.4  15.7%;  Surgeon: Lockie Mola, MD;  Location: [facility];  Service: Ophthalmology;  Laterality: Right;   LAPAROTOMY N/A 02/10/2018   Procedure: EXPLORATORY LAPAROTOMY;  Surgeon: Shonna Chock, MD;  Location: [facility];  Service: Gynecology;  Laterality: N/A;   MASS EXCISION  01/2018   abdominal   OVARIAN CYST REMOVAL     SALPINGOOPHORECTOMY Bilateral 02/10/2018   Procedure: BILATERAL SALPINGO OOPHORECTOMY; PERITONEAL WASHINGS;  Surgeon: Shonna Chock, MD;  Location: [facility];  Service: Gynecology;  Laterality: Bilateral;   TONSILLECTOMY     TONSILLECTOMY AND ADENOIDECTOMY  1953   Patient Active Problem List   Diagnosis Date Noted   Neurogenic bladder 05/14/2023   Prediabetes 12/15/2022   Chronic cough 08/17/2022   History of allergy to shellfish 02/24/2022   Chronic low back pain of over 3 months duration 02/04/2022   Osteoarthritis of  AC (acromioclavicular) joint (Left) 02/04/2022   Osteoarthritis of hip (Right) 02/04/2022   Impaction fracture of distal end of radius and ulna, sequela (Left) 02/04/2022   Osteoarthritis of 1st (thumb) carpometacarpal (CMC) joint of  hand (Left) 02/04/2022   Lumbosacral facet arthropathy (Multilevel) (Bilateral) (T12-S1) 02/04/2022   Lumbar central spinal stenosis w/o neurogenic claudication (L2-3, L3-4, L4-5) 02/04/2022   Lumbar foraminal stenosis (Bilateral: L2-3, L4-5) (Right: (Severe) L4-5) 02/04/2022   Lumbar lateral recess stenosis (Bilateral: L2-3, L3-4, and L4-5) (Severe) 02/04/2022   DDD (degenerative disc disease), lumbar 02/04/2022   Lumbosacral facet syndrome (Multilevel) (Bilateral) 02/04/2022   Osteoarthritis of lumbar spine 02/04/2022   Allergic conjunctivitis of both eyes  12/23/2021   Chronic hip pain (3ry area of Pain) (Bilateral) (L>R) 12/22/2021   Chronic lower extremity pain (2ry area of Pain) (Bilateral) (L>R) 12/22/2021   Abnormal MRI, lumbar spine (01/31/2020) 12/22/2021   Grade 1 Anterolisthesis of lumbar spine (L3/L4) & (5 mm) (L4/L5) 12/22/2021   Chronic pain syndrome 11/17/2021   High risk medication use 11/17/2021   Disorder of skeletal system 11/17/2021   Problems influencing health status 11/17/2021   Other hyperlipidemia 10/21/2021   Polypharmacy 08/12/2021   Chronic low back pain (1ry area of Pain) (Bilateral) (L>R) w/o sciatica 06/18/2021   Osteopenia of neck of femur (Right) 09/06/2020   Obesity 08/29/2020   Parkinson disease 02/22/2020   Mucinous cystadenoma 12/24/2019   Bipolar disorder, in full remission, most recent episode depressed (HCC) 11/08/2019   Vitamin D insufficiency 08/24/2019   Insomnia due to mental condition 05/31/2019   Bipolar I disorder, most recent episode depressed (HCC) 05/31/2019   Alcohol use disorder, moderate, in sustained remission (HCC) 05/31/2019   Elevated tumor markers 05/27/2019   GAD (generalized anxiety disorder) 04/10/2019   MDD (major depressive disorder), severe (HCC) 01/31/2019   High serum carbohydrate antigen 19-9 (CA19-9) 12/01/2018   Adjustment disorder with anxious mood 11/24/2018   Essential hypertension 11/24/2018   B12 deficiency 10/17/2018   Incisional hernia, without obstruction or gangrene 09/12/2018   Genetic testing 04/07/2018   Family history of breast cancer 03/24/2018   Family history of lung cancer 03/24/2018   Malignant neoplasm of upper-outer quadrant of right breast in female, estrogen receptor positive (HCC) 03/17/2018   Malignant neoplasm of upper-inner quadrant of right breast in female, estrogen receptor positive (HCC) 03/17/2018   History of psychosis 02/07/2018   History of insomnia 02/07/2018    ONSET DATE: Pt diagnosed with PD in 2019  REFERRING DIAG:  G20  (ICD-10-CM) - Parkinson's disease      THERAPY DIAG:  Muscle weakness (generalized)  Difficulty in walking, not elsewhere classified  Unsteadiness on feet  Other lack of coordination  Pain in left hip  Rationale for Evaluation and Treatment: Rehabilitation  SUBJECTIVE:  Pt out of town for wedding in [place] this weekend. Got back late last night and is tired today.   PERTINENT HISTORY:  Pt is a pleasant 77 yo female who was diagnosed with PD in 2019. She is known to clinic and [facility], and was last seen for LSVT BIG Sept-Oct 2022. Pt with chronic pain and has become increasingly sedentary as a result exacerbating mobility difficulties due to PD. Current concerns: Pt reports L and R hip pain and back pain. Pt was being seen by pain management clinic. Pt has had injections that provided relief at the time. Pt's spouse reports her physician determined surgery was not an option for her pain so the plan is to manage it. Pt spouse thinks pain has resulted in reluctance for pt to move. Pt currently sedentary, doesn't walk much, but when she does she holds on to furniture for support due to balance problems. When pt first stands  she must hold on to something due to mix of pain and/or dizziness (described as lightheadedness). Her pain can go from 3/10 to 9/10. It does improve once she starts moving. Pt has increased difficulty with multiple activities including: getting in and out of her bed, getting in and out of her car, freezing, decreased gait speed, decreased balance and cognition, and difficulty using BUE due to tremors.  Per chart other PMH includes hx of breast cancer, HTN, MCI, insomnia, cataracts, bipolar 1, alcohol use disorder, GAD, MDD, osteopenia of neck of R femur, chronic B low back pain with radiating pain to buttock and posterior thighs and calves. Pt with multiple comorbidities, please refer to chart for full details    PAIN: from eval Are you having pain? Yes 7/10 Left hip    PRECAUTIONS: Fall  WEIGHT BEARING RESTRICTIONS: No  FALLS: Has patient fallen in last 6 months? Yes. Number of falls 1x, leg tangled in chair leg when getting up from a chair  PLOF: Independent with basic ADLs  PATIENT GOALS: improve mobility  OBJECTIVE   TODAY'S TREATMENT:                                                                                                                                         DATE: 07/06/23  - STS x8 --> 24ft ball toss/catch --> AMB 154ft overground, 3lb AW bilat - STS x15 --> 43ft tennis racquet hitting return --> AMB 16ft overground, 3lb AW bilat - STS x8 --> 30ft baseball bat hitting return --> AMB 151ft overground, 3lb AW bilat  -orthostatic vitals assessment -seated: 153/44mmHg 80bpm cuff is painful  - standing: 156/36mmHg  89bpm cuff is painful   -seated nerf ball milk jug toss x12  PATIENT EDUCATION:  Education details: Pt educated throughout session about proper posture and technique with exercises. Improved exercise technique, movement at target joints, use of target muscles after min to mod verbal, visual, tactile cues.  Person educated: Patient and Spouse Education method: Explanation, Demonstration, and Verbal cues Education comprehension: verbalized understanding, returned demonstration, verbal cues required, tactile cues required, and needs further education    HOME EXERCISE PROGRAM:   8//20/24: Access Code: [email or web address] URL: [email or web address] Date: 06/22/2023 Prepared by: Temple Pacini  Exercises - Sit to Stand with Counter Support  - 1 x daily - 4-6 x weekly - 2 sets - 10 reps - Seated Long Arc Quad  - 1 x daily - 4-6 x weekly - 2 sets - 12 reps - Seated Heel Raise  - 1 x daily - 4-6 x weekly - 1 sets - 20 reps   Access Code: [email or web address] URL: [email or web address] Date: 12/22/2022 Prepared by: Temple Pacini  Exercises - Sit to Stand with Counter Support  - 1 x daily - 5 x weekly - 3 sets -  10 reps  GOALS: Goals reviewed with patient? Yes  SHORT TERM GOALS: Target date: 04/27/2023  Patient will be independent in home exercise program to improve strength/mobility for better functional independence with ADLs. Baseline: LSVT BIG protocol to be initiated next session; 12/18: not yet fully indep, reports she still gets confused about the technique; 12/03/2022: HEP to be updated; Pt reports practicing HEP 2x/week; 02/23/23: not currently performing HEP, reviewed in session and provided new copy; 03/16/2023: pt reports performing HEP every day; 03/30/23: Pt reports she is not being consistent with her HEP; 7/1: pt reports she is not doing much at home; 06/10/23: pt unsure how often she is exercising at home Goal status: MET (previously MET)   LONG TERM GOALS: Target date: 06/08/2023     Patient will increase FOTO score to equal to or greater than 49 to demonstrate statistically significant improvement in mobility and quality of life.  Baseline: 40; 12/18: deferred due to time; 45; 3/19: deferred to next visit; 01/21/2023; 02/23/2023: 46 unchanged from previous assessment); 03/17/2023: 48; 06/10/23: 52 Goal status: MET  2.  Patient (> 103 years old) will complete five times sit to stand test in < 15 seconds indicating decreased fall risk.  Baseline: 27 sec; 12/18 23 sec hands-free; 11/04/22: 24 sec hands free; 2/1/124: 22.7 sec use of BUE, 21.3 sec hands-free; 01/19/23: 18 seconds hands-free; 02/23/23: 19 seconds hands-free; 03/16/2023: 18 seconds hands-free; 03/30/23: 17.3 seconds hands-free; 05/03/23: 17.3 seconds hands-free; 06/10/23: 19 seconds hands-free Goal status: IN PROGRESS  3.  Patient will increase 10 meter walk test to >1.27m/s with WFL B step-length, upright posture for >90% of test to improve ease with gait and to override hypokentic and bradykinetic movement.  Baseline: 0.58 m/s with 5WU; 12/18: 0.7 m/s with 9WJ; 11/04/22: 0.69 m/s with 1BJ;  12/03/22: 0.73 m/s with 4NW; 01/19/2023: 0.77 m/s with  2NF; 02/23/2023: 0.83 m/s with 6OZ; 03/16/2023: 0.83 m/s with 3YQ , pt still somewhat crouched, requires cues for upright posture >50% of time; 03/30/23: 0.83 m/s with crouched posture; 05/03/23 0.8 m/s with RW, some improvement with upright posture; 06/10/23:  0.77 m/s with 4WW and crouched posture  Goal status: IN PROGRESS  4.  Patient will reduce time to don jacket by at least 5 seconds in order to increase ease with dressing and override bradykinetic movement Baseline: 17 seconds; 12/18: 12 seconds Goal status: MET  5.  Pt will demo or report ability to open standard jar using only 1-2 attempts in order to exhibit improved functional mobility for ADLs.  Baseline: 5.7 seconds with multiple attempts to turn jar lid to open; 12/18: 2.8 sec opens in 2 turns Goal status: MET  6.  Patient will increase Berg Balance score by > 6 points to demonstrate decreased fall risk during functional activities. Baseline: to be tested next 1-2 visits; 12/03/2022: 44/56; 01/21/23 45/56; 48/56; 03/17/2023: 48/56; 05/03/23: 50/56; 8/8: 48/56 Goal status: Partially met   7.  Patient will report at least 4 days in a row with minimal to no L hip pain in order to improve QOL and ease/safety with functional mobility. Baseline: Pt reports pain majority of days in L hip; 12/03/2022: pt reports having hip pain about every day; 3/19: rates hip pain daily (being seen now by MD, new imaging); 02/23/2023: pt went for period of time without hip pain (she thinks a week), but reports it has now come back,; 5/28: pt reports hip pain "every other day"; 05/03/23: pt reports pain in hip every day in past week Goal status: NOT MET/discontinued   8. Patient will increase six minute walk test distance  to >1000 for progression to community ambulator and improve gait ability Baseline:02/23/2023: 722 ft with 4WW and two standing rest breaks, rates test as hard; 03/16/2023: deferred to next visit due to time; 5/28/20204: 641 ft with 4WW, pt requests to end  test early due to fatigue at 5 min 35 sec; 05/03/23: 645 ft with 4WW and able to complete full test; 06/10/23: 708 ft Goal status: IN PROGRESS  ASSESSMENT: Continued with circuit based training. Emphasis on moderate to high intensity activities and large amplitude movements. Pt recovers well with intervals between activities. P table to increase amount of ankle resistance during AMB overground. The pt will benefit from further skilled PT to improve strength, balance, gait and mobility.  OBJECTIVE IMPAIRMENTS: Abnormal gait, decreased balance, decreased cognition, decreased coordination, decreased endurance, decreased knowledge of use of DME, decreased mobility, difficulty walking, decreased strength, dizziness, hypomobility, impaired UE functional use, improper body mechanics, postural dysfunction, and pain.   ACTIVITY LIMITATIONS: lifting, bending, standing, squatting, stairs, transfers, bed mobility, bathing, dressing, hygiene/grooming, and locomotion level  PARTICIPATION LIMITATIONS: meal prep, cleaning, laundry, medication management, personal finances, driving, shopping, community activity, and yard work  PERSONAL FACTORS: Age, Fitness, Sex, Time since onset of injury/illness/exacerbation, and 3+ comorbidities: PMH includes hx of breast cancer, HTN, MCI, insomnia, cataracts, bipolar 1, alcohol use disorder, GAD, MDD, osteopenia of neck of R femur, chronic B low back pain with radiating pain to buttock and posterior thighs and calves. Pt with multiple comorbidities, please refer to chart for full details  are also affecting patient's functional outcome.   REHAB POTENTIAL: Fair    CLINICAL DECISION MAKING: Evolving/moderate complexity  EVALUATION COMPLEXITY: High  PLAN:  PT FREQUENCY: 1-2x/week  PT DURATION: 8 weeks  PLANNED INTERVENTIONS: Therapeutic exercises, Therapeutic activity, Neuromuscular re-education, Balance training, Gait training, Patient/Family education, Self Care, Joint  mobilization, Stair training, Vestibular training, Canalith repositioning, Visual/preceptual remediation/compensation, Orthotic/Fit training, DME instructions, Wheelchair mobility training, Spinal mobilization, Cryotherapy, Moist heat, Splintting, Taping, and Manual therapy  PLAN FOR NEXT SESSION:    11:59 AM, 07/06/23    Faruq Rosenberger C, PT 07/06/2023, 11:59 AM  Physical Therapist - [facility]  Outpatient Physical Therapy- Main Campus [phone]

## 2023-07-08 ENCOUNTER — Ambulatory Visit: Payer: Medicare PPO

## 2023-07-11 ENCOUNTER — Other Ambulatory Visit: Payer: Self-pay | Admitting: Psychiatry

## 2023-07-11 ENCOUNTER — Other Ambulatory Visit: Payer: Self-pay | Admitting: Internal Medicine

## 2023-07-11 DIAGNOSIS — C50411 Malignant neoplasm of upper-outer quadrant of right female breast: Secondary | ICD-10-CM

## 2023-07-11 DIAGNOSIS — F3176 Bipolar disorder, in full remission, most recent episode depressed: Secondary | ICD-10-CM

## 2023-07-12 ENCOUNTER — Encounter: Payer: Self-pay | Admitting: Internal Medicine

## 2023-07-13 ENCOUNTER — Ambulatory Visit: Payer: Medicare PPO

## 2023-07-13 DIAGNOSIS — R2681 Unsteadiness on feet: Secondary | ICD-10-CM

## 2023-07-13 DIAGNOSIS — R278 Other lack of coordination: Secondary | ICD-10-CM | POA: Diagnosis not present

## 2023-07-13 DIAGNOSIS — R262 Difficulty in walking, not elsewhere classified: Secondary | ICD-10-CM

## 2023-07-13 DIAGNOSIS — M6281 Muscle weakness (generalized): Secondary | ICD-10-CM | POA: Diagnosis not present

## 2023-07-13 DIAGNOSIS — M25552 Pain in left hip: Secondary | ICD-10-CM | POA: Diagnosis not present

## 2023-07-13 NOTE — Therapy (Signed)
OUTPATIENT PHYSICAL THERAPY TREATMENT     Patient Name: Kendra [name] MRN: [id number] DOB:[birth year], 77 y.o., female Today's Date: 07/13/2023  PCP: Erasmo Downer, MD REFERRING PROVIDER: Erasmo Downer, MD  END OF SESSION:  PT End of Session - 07/13/23 1140     Visit Number 65    Number of Visits 68    Date for PT Re-Evaluation 08/05/23    Authorization Type [employer] PPO    Authorization Time Period 06/10/23-08/05/23    Progress Note Due on Visit 70    PT Start Time 1144    PT Stop Time 1227    PT Time Calculation (min) 43 min    Activity Tolerance Patient tolerated treatment well    Behavior During Therapy [facility] for tasks assessed/performed                               Past Medical History:  Diagnosis Date   Anxiety    Breast cancer (HCC) 03/2018   right breast cancer at 11:00 and 1:00   Bursitis    bilateral hips and knees   Complication of anesthesia    Depression    Family history of breast cancer 03/24/2018   Family history of lung cancer 03/24/2018   History of alcohol dependence (HCC) 2007   no ETOH; resolved since 2007   History of hiatal hernia    History of psychosis 2014   due to sleep disturbance   Hypertension    Parkinson disease    Personal history of radiation therapy 06/2018-07/2018   right breast ca   PONV (postoperative nausea and vomiting)    Past Surgical History:  Procedure Laterality Date   BREAST BIOPSY Right 03/14/2018   11:00 DCIS and invasive ductal carcinoma   BREAST BIOPSY Right 03/14/2018   1:00 Invasive ductal carcinoma   BREAST LUMPECTOMY Right 04/27/2018   lumpectomy of 11 and 1:00 cancers, clear margins, negative LN   BREAST LUMPECTOMY WITH RADIOACTIVE SEED AND SENTINEL LYMPH NODE BIOPSY Right 04/27/2018   Procedure: RIGHT BREAST LUMPECTOMY WITH RADIOACTIVE SEED X 2 AND RIGHT SENTINEL LYMPH NODE BIOPSY;  Surgeon: Glenna Fellows, MD;  Location: [facility];   Service: General;  Laterality: Right;   CATARACT EXTRACTION W/PHACO Left 08/30/2019   Procedure: CATARACT EXTRACTION PHACO AND INTRAOCULAR LENS PLACEMENT (IOC) LEFT panoptix toric  01:20.5  12.7%  10.26;  Surgeon: Lockie Mola, MD;  Location: [facility];  Service: Ophthalmology;  Laterality: Left;   CATARACT EXTRACTION W/PHACO Right 09/20/2019   Procedure: CATARACT EXTRACTION PHACO AND INTRAOCULAR LENS PLACEMENT (IOC) RIGHT 5.71  00:36.4  15.7%;  Surgeon: Lockie Mola, MD;  Location: [facility];  Service: Ophthalmology;  Laterality: Right;   LAPAROTOMY N/A 02/10/2018   Procedure: EXPLORATORY LAPAROTOMY;  Surgeon: Shonna Chock, MD;  Location: [facility];  Service: Gynecology;  Laterality: N/A;   MASS EXCISION  01/2018   abdominal   OVARIAN CYST REMOVAL     SALPINGOOPHORECTOMY Bilateral 02/10/2018   Procedure: BILATERAL SALPINGO OOPHORECTOMY; PERITONEAL WASHINGS;  Surgeon: Shonna Chock, MD;  Location: [facility];  Service: Gynecology;  Laterality: Bilateral;   TONSILLECTOMY     TONSILLECTOMY AND ADENOIDECTOMY  1953   Patient Active Problem List   Diagnosis Date Noted   Neurogenic bladder 05/14/2023   Prediabetes 12/15/2022   Chronic cough 08/17/2022   History of allergy to shellfish 02/24/2022   Chronic low back pain of over 3 months  duration 02/04/2022   Osteoarthritis of  AC (acromioclavicular) joint (Left) 02/04/2022   Osteoarthritis of hip (Right) 02/04/2022   Impaction fracture of distal end of radius and ulna, sequela (Left) 02/04/2022   Osteoarthritis of 1st (thumb) carpometacarpal (CMC) joint of hand (Left) 02/04/2022   Lumbosacral facet arthropathy (Multilevel) (Bilateral) (T12-S1) 02/04/2022   Lumbar central spinal stenosis w/o neurogenic claudication (L2-3, L3-4, L4-5) 02/04/2022   Lumbar foraminal stenosis (Bilateral: L2-3, L4-5) (Right: (Severe) L4-5) 02/04/2022   Lumbar lateral recess stenosis (Bilateral: L2-3, L3-4, and L4-5) (Severe)  02/04/2022   DDD (degenerative disc disease), lumbar 02/04/2022   Lumbosacral facet syndrome (Multilevel) (Bilateral) 02/04/2022   Osteoarthritis of lumbar spine 02/04/2022   Allergic conjunctivitis of both eyes 12/23/2021   Chronic hip pain (3ry area of Pain) (Bilateral) (L>R) 12/22/2021   Chronic lower extremity pain (2ry area of Pain) (Bilateral) (L>R) 12/22/2021   Abnormal MRI, lumbar spine (01/31/2020) 12/22/2021   Grade 1 Anterolisthesis of lumbar spine (L3/L4) & (5 mm) (L4/L5) 12/22/2021   Chronic pain syndrome 11/17/2021   High risk medication use 11/17/2021   Disorder of skeletal system 11/17/2021   Problems influencing health status 11/17/2021   Other hyperlipidemia 10/21/2021   Polypharmacy 08/12/2021   Chronic low back pain (1ry area of Pain) (Bilateral) (L>R) w/o sciatica 06/18/2021   Osteopenia of neck of femur (Right) 09/06/2020   Obesity 08/29/2020   Parkinson disease 02/22/2020   Mucinous cystadenoma 12/24/2019   Bipolar disorder, in full remission, most recent episode depressed (HCC) 11/08/2019   Vitamin D insufficiency 08/24/2019   Insomnia due to mental condition 05/31/2019   Bipolar I disorder, most recent episode depressed (HCC) 05/31/2019   Alcohol use disorder, moderate, in sustained remission (HCC) 05/31/2019   Elevated tumor markers 05/27/2019   GAD (generalized anxiety disorder) 04/10/2019   MDD (major depressive disorder), severe (HCC) 01/31/2019   High serum carbohydrate antigen 19-9 (CA19-9) 12/01/2018   Adjustment disorder with anxious mood 11/24/2018   Essential hypertension 11/24/2018   B12 deficiency 10/17/2018   Incisional hernia, without obstruction or gangrene 09/12/2018   Genetic testing 04/07/2018   Family history of breast cancer 03/24/2018   Family history of lung cancer 03/24/2018   Malignant neoplasm of upper-outer quadrant of right breast in female, estrogen receptor positive (HCC) 03/17/2018   Malignant neoplasm of upper-inner quadrant  of right breast in female, estrogen receptor positive (HCC) 03/17/2018   History of psychosis 02/07/2018   History of insomnia 02/07/2018    ONSET DATE: Pt diagnosed with PD in 2019  REFERRING DIAG:  G20 (ICD-10-CM) - Parkinson's disease      THERAPY DIAG:  Muscle weakness (generalized)  Difficulty in walking, not elsewhere classified  Unsteadiness on feet  Other lack of coordination  Pain in left hip  Rationale for Evaluation and Treatment: Rehabilitation  SUBJECTIVE:   Patient reports feeling good this date. Had family visiting from [place] over the weekend. Reporting pain in her L hip (rates 6/10)  PERTINENT HISTORY:  Pt is a pleasant 77 yo female who was diagnosed with PD in 2019. She is known to clinic and [facility], and was last seen for LSVT BIG Sept-Oct 2022. Pt with chronic pain and has become increasingly sedentary as a result exacerbating mobility difficulties due to PD. Current concerns: Pt reports L and R hip pain and back pain. Pt was being seen by pain management clinic. Pt has had injections that provided relief at the time. Pt's spouse reports her physician determined surgery was not an option for  her pain so the plan is to manage it. Pt spouse thinks pain has resulted in reluctance for pt to move. Pt currently sedentary, doesn't walk much, but when she does she holds on to furniture for support due to balance problems. When pt first stands she must hold on to something due to mix of pain and/or dizziness (described as lightheadedness). Her pain can go from 3/10 to 9/10. It does improve once she starts moving. Pt has increased difficulty with multiple activities including: getting in and out of her bed, getting in and out of her car, freezing, decreased gait speed, decreased balance and cognition, and difficulty using BUE due to tremors.  Per chart other PMH includes hx of breast cancer, HTN, MCI, insomnia, cataracts, bipolar 1, alcohol use disorder, GAD, MDD, osteopenia  of neck of R femur, chronic B low back pain with radiating pain to buttock and posterior thighs and calves. Pt with multiple comorbidities, please refer to chart for full details    PAIN: from eval Are you having pain? Yes 7/10 Left hip   PRECAUTIONS: Fall  WEIGHT BEARING RESTRICTIONS: No  FALLS: Has patient fallen in last 6 months? Yes. Number of falls 1x, leg tangled in chair leg when getting up from a chair  PLOF: Independent with basic ADLs  PATIENT GOALS: improve mobility  OBJECTIVE   TODAY'S TREATMENT:                                                                                                                                         DATE: 07/06/23   Circuit training incorporates both TE and NMR   2# AW donned for the following:    Circuit 1: Ambulate 296 ft with rollator with CGA  - pt requests rest break, cues for maintaining close proximity to rollator  STS 10x  Ambulate 148 ft with rollator with CGA  STS 5x   Circuit 2: Seated adductor squeeze with pball 15x bilat LE  LAQ with 2# AW 15x each LE Large amplitude seated twist and reach 10x each side  Seated marching 2# AW x 15 each LE  Large amplitude reach down and up 10x.    Circuit 3: Ambulate 148 ft with rollator with CGA  STS 5x Ambulate 148 ft with rollator with CGA  STS 5x   PATIENT EDUCATION:  Education details: Pt educated throughout session about proper posture and technique with exercises. Improved exercise technique, movement at target joints, use of target muscles after min to mod verbal, visual, tactile cues.  Person educated: Patient and Spouse Education method: Explanation, Demonstration, and Verbal cues Education comprehension: verbalized understanding, returned demonstration, verbal cues required, tactile cues required, and needs further education    HOME EXERCISE PROGRAM:   8//20/24: Access Code: [email or web address] URL: [email or web address] Date: 06/22/2023 Prepared by: Temple Pacini  Exercises - Sit to Stand with Counter Support  - 1 x daily -  4-6 x weekly - 2 sets - 10 reps - Seated Long Arc Quad  - 1 x daily - 4-6 x weekly - 2 sets - 12 reps - Seated Heel Raise  - 1 x daily - 4-6 x weekly - 1 sets - 20 reps   Access Code: [email or web address] URL: [email or web address] Date: 12/22/2022 Prepared by: Temple Pacini  Exercises - Sit to Stand with Counter Support  - 1 x daily - 5 x weekly - 3 sets - 10 reps  GOALS: Goals reviewed with patient? Yes  SHORT TERM GOALS: Target date: 04/27/2023    Patient will be independent in home exercise program to improve strength/mobility for better functional independence with ADLs. Baseline: LSVT BIG protocol to be initiated next session; 12/18: not yet fully indep, reports she still gets confused about the technique; 12/03/2022: HEP to be updated; Pt reports practicing HEP 2x/week; 02/23/23: not currently performing HEP, reviewed in session and provided new copy; 03/16/2023: pt reports performing HEP every day; 03/30/23: Pt reports she is not being consistent with her HEP; 7/1: pt reports she is not doing much at home; 06/10/23: pt unsure how often she is exercising at home Goal status: MET (previously MET)   LONG TERM GOALS: Target date: 06/08/2023     Patient will increase FOTO score to equal to or greater than 49 to demonstrate statistically significant improvement in mobility and quality of life.  Baseline: 40; 12/18: deferred due to time; 45; 3/19: deferred to next visit; 01/21/2023; 02/23/2023: 46 unchanged from previous assessment); 03/17/2023: 48; 06/10/23: 52 Goal status: MET  2.  Patient (> 22 years old) will complete five times sit to stand test in < 15 seconds indicating decreased fall risk.  Baseline: 27 sec; 12/18 23 sec hands-free; 11/04/22: 24 sec hands free; 2/1/124: 22.7 sec use of BUE, 21.3 sec hands-free; 01/19/23: 18 seconds hands-free; 02/23/23: 19 seconds hands-free; 03/16/2023: 18 seconds hands-free; 03/30/23:  17.3 seconds hands-free; 05/03/23: 17.3 seconds hands-free; 06/10/23: 19 seconds hands-free Goal status: IN PROGRESS  3.  Patient will increase 10 meter walk test to >1.81m/s with WFL B step-length, upright posture for >90% of test to improve ease with gait and to override hypokentic and bradykinetic movement.  Baseline: 0.58 m/s with 1HY; 12/18: 0.7 m/s with 8MV; 11/04/22: 0.69 m/s with 7QI;  12/03/22: 0.73 m/s with 6NG; 01/19/2023: 0.77 m/s with 2XB; 02/23/2023: 0.83 m/s with 2WU; 03/16/2023: 0.83 m/s with 1LK , pt still somewhat crouched, requires cues for upright posture >50% of time; 03/30/23: 0.83 m/s with crouched posture; 05/03/23 0.8 m/s with RW, some improvement with upright posture; 06/10/23:  0.77 m/s with 4WW and crouched posture  Goal status: IN PROGRESS  4.  Patient will reduce time to don jacket by at least 5 seconds in order to increase ease with dressing and override bradykinetic movement Baseline: 17 seconds; 12/18: 12 seconds Goal status: MET  5.  Pt will demo or report ability to open standard jar using only 1-2 attempts in order to exhibit improved functional mobility for ADLs.  Baseline: 5.7 seconds with multiple attempts to turn jar lid to open; 12/18: 2.8 sec opens in 2 turns Goal status: MET  6.  Patient will increase Berg Balance score by > 6 points to demonstrate decreased fall risk during functional activities. Baseline: to be tested next 1-2 visits; 12/03/2022: 44/56; 01/21/23 45/56; 48/56; 03/17/2023: 48/56; 05/03/23: 50/56; 8/8: 48/56 Goal status: Partially met   7.  Patient will report at least 4 days in a row with  minimal to no L hip pain in order to improve QOL and ease/safety with functional mobility. Baseline: Pt reports pain majority of days in L hip; 12/03/2022: pt reports having hip pain about every day; 3/19: rates hip pain daily (being seen now by MD, new imaging); 02/23/2023: pt went for period of time without hip pain (she thinks a week), but reports it has now come back,; 5/28:  pt reports hip pain "every other day"; 05/03/23: pt reports pain in hip every day in past week Goal status: NOT MET/discontinued   8. Patient will increase six minute walk test distance to >1000 for progression to community ambulator and improve gait ability Baseline:02/23/2023: 722 ft with 4WW and two standing rest breaks, rates test as hard; 03/16/2023: deferred to next visit due to time; 5/28/20204: 641 ft with 4WW, pt requests to end test early due to fatigue at 5 min 35 sec; 05/03/23: 645 ft with 4WW and able to complete full test; 06/10/23: 708 ft Goal status: IN PROGRESS  ASSESSMENT:  Continued with circuit based training. Emphasis on moderate to high intensity activities and large amplitude movements. Pt recovers well with intervals between activities. The pt will benefit from further skilled PT to improve strength, balance, gait and mobility.  OBJECTIVE IMPAIRMENTS: Abnormal gait, decreased balance, decreased cognition, decreased coordination, decreased endurance, decreased knowledge of use of DME, decreased mobility, difficulty walking, decreased strength, dizziness, hypomobility, impaired UE functional use, improper body mechanics, postural dysfunction, and pain.   ACTIVITY LIMITATIONS: lifting, bending, standing, squatting, stairs, transfers, bed mobility, bathing, dressing, hygiene/grooming, and locomotion level  PARTICIPATION LIMITATIONS: meal prep, cleaning, laundry, medication management, personal finances, driving, shopping, community activity, and yard work  PERSONAL FACTORS: Age, Fitness, Sex, Time since onset of injury/illness/exacerbation, and 3+ comorbidities: PMH includes hx of breast cancer, HTN, MCI, insomnia, cataracts, bipolar 1, alcohol use disorder, GAD, MDD, osteopenia of neck of R femur, chronic B low back pain with radiating pain to buttock and posterior thighs and calves. Pt with multiple comorbidities, please refer to chart for full details  are also affecting patient's  functional outcome.   REHAB POTENTIAL: Fair    CLINICAL DECISION MAKING: Evolving/moderate complexity  EVALUATION COMPLEXITY: High  PLAN:  PT FREQUENCY: 1-2x/week  PT DURATION: 8 weeks  PLANNED INTERVENTIONS: Therapeutic exercises, Therapeutic activity, Neuromuscular re-education, Balance training, Gait training, Patient/Family education, Self Care, Joint mobilization, Stair training, Vestibular training, Canalith repositioning, Visual/preceptual remediation/compensation, Orthotic/Fit training, DME instructions, Wheelchair mobility training, Spinal mobilization, Cryotherapy, Moist heat, Splintting, Taping, and Manual therapy  PLAN FOR NEXT SESSION:    Maylon Peppers, PT, DPT Physical Therapist - [place]  [facility]  07/13/2023, 11:42 AM

## 2023-07-15 ENCOUNTER — Ambulatory Visit: Payer: Medicare PPO

## 2023-07-20 ENCOUNTER — Ambulatory Visit: Payer: Medicare PPO

## 2023-07-22 ENCOUNTER — Ambulatory Visit: Payer: Medicare PPO

## 2023-07-22 DIAGNOSIS — H6123 Impacted cerumen, bilateral: Secondary | ICD-10-CM | POA: Diagnosis not present

## 2023-07-22 DIAGNOSIS — H6063 Unspecified chronic otitis externa, bilateral: Secondary | ICD-10-CM | POA: Diagnosis not present

## 2023-07-22 DIAGNOSIS — H903 Sensorineural hearing loss, bilateral: Secondary | ICD-10-CM | POA: Diagnosis not present

## 2023-07-27 ENCOUNTER — Ambulatory Visit: Payer: Medicare PPO

## 2023-07-27 DIAGNOSIS — R2681 Unsteadiness on feet: Secondary | ICD-10-CM

## 2023-07-27 DIAGNOSIS — M25552 Pain in left hip: Secondary | ICD-10-CM

## 2023-07-27 DIAGNOSIS — R278 Other lack of coordination: Secondary | ICD-10-CM | POA: Diagnosis not present

## 2023-07-27 DIAGNOSIS — M6281 Muscle weakness (generalized): Secondary | ICD-10-CM | POA: Diagnosis not present

## 2023-07-27 DIAGNOSIS — R262 Difficulty in walking, not elsewhere classified: Secondary | ICD-10-CM | POA: Diagnosis not present

## 2023-07-27 NOTE — Therapy (Signed)
OUTPATIENT PHYSICAL THERAPY TREATMENT     Patient Name: Kendra [name] MRN: [id number] DOB:01-26-1946, 77 y.o., female Today's Date: 07/27/2023  PCP: Erasmo Downer, MD REFERRING PROVIDER: Erasmo Downer, MD  END OF SESSION:  PT End of Session - 07/27/23 1144     Visit Number 66    Number of Visits 68    Date for PT Re-Evaluation 08/05/23    Authorization Type [employer] PPO    Authorization Time Period 06/10/23-08/05/23    Progress Note Due on Visit 70    PT Start Time 1145    PT Stop Time 1226    PT Time Calculation (min) 41 min    Equipment Utilized During Treatment Gait belt    Activity Tolerance Patient tolerated treatment well    Behavior During Therapy WFL for tasks assessed/performed                               Past Medical History:  Diagnosis Date   Anxiety    Breast cancer (HCC) 03/2018   right breast cancer at 11:00 and 1:00   Bursitis    bilateral hips and knees   Complication of anesthesia    Depression    Family history of breast cancer 03/24/2018   Family history of lung cancer 03/24/2018   History of alcohol dependence (HCC) 2007   no ETOH; resolved since 2007   History of hiatal hernia    History of psychosis 2014   due to sleep disturbance   Hypertension    Parkinson disease    Personal history of radiation therapy 06/2018-07/2018   right breast ca   PONV (postoperative nausea and vomiting)    Past Surgical History:  Procedure Laterality Date   BREAST BIOPSY Right 03/14/2018   11:00 DCIS and invasive ductal carcinoma   BREAST BIOPSY Right 03/14/2018   1:00 Invasive ductal carcinoma   BREAST LUMPECTOMY Right 04/27/2018   lumpectomy of 11 and 1:00 cancers, clear margins, negative LN   BREAST LUMPECTOMY WITH RADIOACTIVE SEED AND SENTINEL LYMPH NODE BIOPSY Right 04/27/2018   Procedure: RIGHT BREAST LUMPECTOMY WITH RADIOACTIVE SEED X 2 AND RIGHT SENTINEL LYMPH NODE BIOPSY;  Surgeon: Glenna Fellows,  MD;  Location: [facility];  Service: General;  Laterality: Right;   CATARACT EXTRACTION W/PHACO Left 08/30/2019   Procedure: CATARACT EXTRACTION PHACO AND INTRAOCULAR LENS PLACEMENT (IOC) LEFT panoptix toric  01:20.5  12.7%  10.26;  Surgeon: Lockie Mola, MD;  Location: [facility];  Service: Ophthalmology;  Laterality: Left;   CATARACT EXTRACTION W/PHACO Right 09/20/2019   Procedure: CATARACT EXTRACTION PHACO AND INTRAOCULAR LENS PLACEMENT (IOC) RIGHT 5.71  00:36.4  15.7%;  Surgeon: Lockie Mola, MD;  Location: [facility];  Service: Ophthalmology;  Laterality: Right;   LAPAROTOMY N/A 02/10/2018   Procedure: EXPLORATORY LAPAROTOMY;  Surgeon: Shonna Chock, MD;  Location: [facility];  Service: Gynecology;  Laterality: N/A;   MASS EXCISION  01/2018   abdominal   OVARIAN CYST REMOVAL     SALPINGOOPHORECTOMY Bilateral 02/10/2018   Procedure: BILATERAL SALPINGO OOPHORECTOMY; PERITONEAL WASHINGS;  Surgeon: Shonna Chock, MD;  Location: [facility];  Service: Gynecology;  Laterality: Bilateral;   TONSILLECTOMY     TONSILLECTOMY AND ADENOIDECTOMY  1953   Patient Active Problem List   Diagnosis Date Noted   Neurogenic bladder 05/14/2023   Prediabetes 12/15/2022   Chronic cough 08/17/2022   History of allergy to shellfish 02/24/2022  Chronic low back pain of over 3 months duration 02/04/2022   Osteoarthritis of  AC (acromioclavicular) joint (Left) 02/04/2022   Osteoarthritis of hip (Right) 02/04/2022   Impaction fracture of distal end of radius and ulna, sequela (Left) 02/04/2022   Osteoarthritis of 1st (thumb) carpometacarpal (CMC) joint of hand (Left) 02/04/2022   Lumbosacral facet arthropathy (Multilevel) (Bilateral) (T12-S1) 02/04/2022   Lumbar central spinal stenosis w/o neurogenic claudication (L2-3, L3-4, L4-5) 02/04/2022   Lumbar foraminal stenosis (Bilateral: L2-3, L4-5) (Right: (Severe) L4-5) 02/04/2022   Lumbar lateral recess stenosis  (Bilateral: L2-3, L3-4, and L4-5) (Severe) 02/04/2022   DDD (degenerative disc disease), lumbar 02/04/2022   Lumbosacral facet syndrome (Multilevel) (Bilateral) 02/04/2022   Osteoarthritis of lumbar spine 02/04/2022   Allergic conjunctivitis of both eyes 12/23/2021   Chronic hip pain (3ry area of Pain) (Bilateral) (L>R) 12/22/2021   Chronic lower extremity pain (2ry area of Pain) (Bilateral) (L>R) 12/22/2021   Abnormal MRI, lumbar spine (01/31/2020) 12/22/2021   Grade 1 Anterolisthesis of lumbar spine (L3/L4) & (5 mm) (L4/L5) 12/22/2021   Chronic pain syndrome 11/17/2021   High risk medication use 11/17/2021   Disorder of skeletal system 11/17/2021   Problems influencing health status 11/17/2021   Other hyperlipidemia 10/21/2021   Polypharmacy 08/12/2021   Chronic low back pain (1ry area of Pain) (Bilateral) (L>R) w/o sciatica 06/18/2021   Osteopenia of neck of femur (Right) 09/06/2020   Obesity 08/29/2020   Parkinson disease 02/22/2020   Mucinous cystadenoma 12/24/2019   Bipolar disorder, in full remission, most recent episode depressed (HCC) 11/08/2019   Vitamin D insufficiency 08/24/2019   Insomnia due to mental condition 05/31/2019   Bipolar I disorder, most recent episode depressed (HCC) 05/31/2019   Alcohol use disorder, moderate, in sustained remission (HCC) 05/31/2019   Elevated tumor markers 05/27/2019   GAD (generalized anxiety disorder) 04/10/2019   MDD (major depressive disorder), severe (HCC) 01/31/2019   High serum carbohydrate antigen 19-9 (CA19-9) 12/01/2018   Adjustment disorder with anxious mood 11/24/2018   Essential hypertension 11/24/2018   B12 deficiency 10/17/2018   Incisional hernia, without obstruction or gangrene 09/12/2018   Genetic testing 04/07/2018   Family history of breast cancer 03/24/2018   Family history of lung cancer 03/24/2018   Malignant neoplasm of upper-outer quadrant of right breast in female, estrogen receptor positive (HCC) 03/17/2018    Malignant neoplasm of upper-inner quadrant of right breast in female, estrogen receptor positive (HCC) 03/17/2018   History of psychosis 02/07/2018   History of insomnia 02/07/2018    ONSET DATE: Pt diagnosed with PD in 2019  REFERRING DIAG:  G20 (ICD-10-CM) - Parkinson's disease      THERAPY DIAG:  Muscle weakness (generalized)  Unsteadiness on feet  Difficulty in walking, not elsewhere classified  Pain in left hip  Rationale for Evaluation and Treatment: Rehabilitation  SUBJECTIVE:   Pt reports doing well. She reports some L hip discomfort currently. Reports has not tried group PD classes. Caregiver reports pt's home activity is limited, but that she sees pt on Tuesdays only so not sure about other days of the week.  PERTINENT HISTORY:  Pt is a pleasant 77 yo female who was diagnosed with PD in 2019. She is known to clinic and [facility], and was last seen for LSVT BIG Sept-Oct 2022. Pt with chronic pain and has become increasingly sedentary as a result exacerbating mobility difficulties due to PD. Current concerns: Pt reports L and R hip pain and back pain. Pt was being seen by pain management  clinic. Pt has had injections that provided relief at the time. Pt's spouse reports her physician determined surgery was not an option for her pain so the plan is to manage it. Pt spouse thinks pain has resulted in reluctance for pt to move. Pt currently sedentary, doesn't walk much, but when she does she holds on to furniture for support due to balance problems. When pt first stands she must hold on to something due to mix of pain and/or dizziness (described as lightheadedness). Her pain can go from 3/10 to 9/10. It does improve once she starts moving. Pt has increased difficulty with multiple activities including: getting in and out of her bed, getting in and out of her car, freezing, decreased gait speed, decreased balance and cognition, and difficulty using BUE due to tremors.  Per chart other  PMH includes hx of breast cancer, HTN, MCI, insomnia, cataracts, bipolar 1, alcohol use disorder, GAD, MDD, osteopenia of neck of R femur, chronic B low back pain with radiating pain to buttock and posterior thighs and calves. Pt with multiple comorbidities, please refer to chart for full details    PAIN: from eval Are you having pain? Yes 7/10 Left hip   PRECAUTIONS: Fall  WEIGHT BEARING RESTRICTIONS: No  FALLS: Has patient fallen in last 6 months? Yes. Number of falls 1x, leg tangled in chair leg when getting up from a chair  PLOF: Independent with basic ADLs  PATIENT GOALS: improve mobility  OBJECTIVE   TODAY'S TREATMENT:                                                                                                                                         DATE: 07/06/23    TE: Gait for endurance with 4WW, CGA  x approx 444 ft STS 8x  Gait for endurance with 4WW, 3# AW, CGA  x approx 296 ft, limited by fatigue/pt requests rest break STS 5x - limited due to L hip discomfort   SpO2% >95% HR: 95-97 bpm following exercises  NMR: Seated FWD reach>reach to floor>reach up> reach out to the sides, emphasis on large-amplitude movement 2x10 with 3 sec hold/rep. Pt states, "it's tiring"   Seated reach and twist 10x each way. Pt rates fatiguing   Stand LTL step and reach 2x10 each way. Pt requires rest break between sets due to fatigue  FWD step and reach 5x each side        PATIENT EDUCATION:  Education details: Pt educated throughout session about proper posture and technique with exercises. Improved exercise technique, movement at target joints, use of target muscles after min to mod verbal, visual, tactile cues.  Person educated: Patient and Spouse Education method: Explanation, Demonstration, and Verbal cues Education comprehension: verbalized understanding, returned demonstration, verbal cues required, tactile cues required, and needs further education    HOME  EXERCISE PROGRAM:   8//20/24: Access Code: [email or web address] URL: [email or web address]  Date: 06/22/2023 Prepared by: Temple Pacini  Exercises - Sit to Stand with Counter Support  - 1 x daily - 4-6 x weekly - 2 sets - 10 reps - Seated Long Arc Quad  - 1 x daily - 4-6 x weekly - 2 sets - 12 reps - Seated Heel Raise  - 1 x daily - 4-6 x weekly - 1 sets - 20 reps   Access Code: [email or web address] URL: [email or web address] Date: 12/22/2022 Prepared by: Temple Pacini  Exercises - Sit to Stand with Counter Support  - 1 x daily - 5 x weekly - 3 sets - 10 reps  GOALS: Goals reviewed with patient? Yes  SHORT TERM GOALS: Target date: 04/27/2023    Patient will be independent in home exercise program to improve strength/mobility for better functional independence with ADLs. Baseline: LSVT BIG protocol to be initiated next session; 12/18: not yet fully indep, reports she still gets confused about the technique; 12/03/2022: HEP to be updated; Pt reports practicing HEP 2x/week; 02/23/23: not currently performing HEP, reviewed in session and provided new copy; 03/16/2023: pt reports performing HEP every day; 03/30/23: Pt reports she is not being consistent with her HEP; 7/1: pt reports she is not doing much at home; 06/10/23: pt unsure how often she is exercising at home Goal status: MET (previously MET)   LONG TERM GOALS: Target date: 06/08/2023     Patient will increase FOTO score to equal to or greater than 49 to demonstrate statistically significant improvement in mobility and quality of life.  Baseline: 40; 12/18: deferred due to time; 45; 3/19: deferred to next visit; 01/21/2023; 02/23/2023: 46 unchanged from previous assessment); 03/17/2023: 48; 06/10/23: 52 Goal status: MET  2.  Patient (> 67 years old) will complete five times sit to stand test in < 15 seconds indicating decreased fall risk.  Baseline: 27 sec; 12/18 23 sec hands-free; 11/04/22: 24 sec hands free; 2/1/124: 22.7 sec use of  BUE, 21.3 sec hands-free; 01/19/23: 18 seconds hands-free; 02/23/23: 19 seconds hands-free; 03/16/2023: 18 seconds hands-free; 03/30/23: 17.3 seconds hands-free; 05/03/23: 17.3 seconds hands-free; 06/10/23: 19 seconds hands-free Goal status: IN PROGRESS  3.  Patient will increase 10 meter walk test to >1.21m/s with WFL B step-length, upright posture for >90% of test to improve ease with gait and to override hypokentic and bradykinetic movement.  Baseline: 0.58 m/s with 2GM; 12/18: 0.7 m/s with 0NU; 11/04/22: 0.69 m/s with 2VO;  12/03/22: 0.73 m/s with 5DG; 01/19/2023: 0.77 m/s with 6YQ; 02/23/2023: 0.83 m/s with 0HK; 03/16/2023: 0.83 m/s with 7QQ , pt still somewhat crouched, requires cues for upright posture >50% of time; 03/30/23: 0.83 m/s with crouched posture; 05/03/23 0.8 m/s with RW, some improvement with upright posture; 06/10/23:  0.77 m/s with 4WW and crouched posture  Goal status: IN PROGRESS  4.  Patient will reduce time to don jacket by at least 5 seconds in order to increase ease with dressing and override bradykinetic movement Baseline: 17 seconds; 12/18: 12 seconds Goal status: MET  5.  Pt will demo or report ability to open standard jar using only 1-2 attempts in order to exhibit improved functional mobility for ADLs.  Baseline: 5.7 seconds with multiple attempts to turn jar lid to open; 12/18: 2.8 sec opens in 2 turns Goal status: MET  6.  Patient will increase Berg Balance score by > 6 points to demonstrate decreased fall risk during functional activities. Baseline: to be tested next 1-2 visits; 12/03/2022: 44/56; 01/21/23 45/56; 48/56; 03/17/2023: 48/56; 05/03/23: 50/56;  8/8: 48/56 Goal status: Partially met   7.  Patient will report at least 4 days in a row with minimal to no L hip pain in order to improve QOL and ease/safety with functional mobility. Baseline: Pt reports pain majority of days in L hip; 12/03/2022: pt reports having hip pain about every day; 3/19: rates hip pain daily (being seen now by  MD, new imaging); 02/23/2023: pt went for period of time without hip pain (she thinks a week), but reports it has now come back,; 5/28: pt reports hip pain "every other day"; 05/03/23: pt reports pain in hip every day in past week Goal status: NOT MET/discontinued   8. Patient will increase six minute walk test distance to >1000 for progression to community ambulator and improve gait ability Baseline:02/23/2023: 722 ft with 4WW and two standing rest breaks, rates test as hard; 03/16/2023: deferred to next visit due to time; 5/28/20204: 641 ft with 4WW, pt requests to end test early due to fatigue at 5 min 35 sec; 05/03/23: 645 ft with 4WW and able to complete full test; 06/10/23: 708 ft Goal status: IN PROGRESS  ASSESSMENT:  Pt continues to complete endurance and large-amplitude based interventions. Pt limited by both fatigue and L hip pain today, requires frequent rest breaks for recovery. SpO2% and HR assessed following interventions and are WNL for activities. Will continue to monitor. The pt will benefit from further skilled PT to improve strength, balance, gait and mobility.  OBJECTIVE IMPAIRMENTS: Abnormal gait, decreased balance, decreased cognition, decreased coordination, decreased endurance, decreased knowledge of use of DME, decreased mobility, difficulty walking, decreased strength, dizziness, hypomobility, impaired UE functional use, improper body mechanics, postural dysfunction, and pain.   ACTIVITY LIMITATIONS: lifting, bending, standing, squatting, stairs, transfers, bed mobility, bathing, dressing, hygiene/grooming, and locomotion level  PARTICIPATION LIMITATIONS: meal prep, cleaning, laundry, medication management, personal finances, driving, shopping, community activity, and yard work  PERSONAL FACTORS: Age, Fitness, Sex, Time since onset of injury/illness/exacerbation, and 3+ comorbidities: PMH includes hx of breast cancer, HTN, MCI, insomnia, cataracts, bipolar 1, alcohol use disorder,  GAD, MDD, osteopenia of neck of R femur, chronic B low back pain with radiating pain to buttock and posterior thighs and calves. Pt with multiple comorbidities, please refer to chart for full details  are also affecting patient's functional outcome.   REHAB POTENTIAL: Fair    CLINICAL DECISION MAKING: Evolving/moderate complexity  EVALUATION COMPLEXITY: High  PLAN:  PT FREQUENCY: 1-2x/week  PT DURATION: 8 weeks  PLANNED INTERVENTIONS: Therapeutic exercises, Therapeutic activity, Neuromuscular re-education, Balance training, Gait training, Patient/Family education, Self Care, Joint mobilization, Stair training, Vestibular training, Canalith repositioning, Visual/preceptual remediation/compensation, Orthotic/Fit training, DME instructions, Wheelchair mobility training, Spinal mobilization, Cryotherapy, Moist heat, Splintting, Taping, and Manual therapy  PLAN FOR NEXT SESSION:    Temple Pacini PT, DPT  Physical Therapist - [place]  [facility]  07/27/2023, 12:33 PM

## 2023-07-29 ENCOUNTER — Ambulatory Visit: Payer: Medicare PPO

## 2023-07-30 DIAGNOSIS — G20C Parkinsonism, unspecified: Secondary | ICD-10-CM | POA: Diagnosis not present

## 2023-08-10 ENCOUNTER — Ambulatory Visit (INDEPENDENT_AMBULATORY_CARE_PROVIDER_SITE_OTHER): Payer: Medicare PPO | Admitting: Psychiatry

## 2023-08-10 ENCOUNTER — Encounter: Payer: Self-pay | Admitting: Psychiatry

## 2023-08-10 ENCOUNTER — Ambulatory Visit: Payer: Medicare PPO | Attending: Family Medicine

## 2023-08-10 VITALS — BP 112/68 | HR 102 | Temp 97.3°F | Ht 66.0 in | Wt 213.4 lb

## 2023-08-10 DIAGNOSIS — M6281 Muscle weakness (generalized): Secondary | ICD-10-CM | POA: Insufficient documentation

## 2023-08-10 DIAGNOSIS — R269 Unspecified abnormalities of gait and mobility: Secondary | ICD-10-CM | POA: Diagnosis not present

## 2023-08-10 DIAGNOSIS — R296 Repeated falls: Secondary | ICD-10-CM | POA: Diagnosis present

## 2023-08-10 DIAGNOSIS — M25552 Pain in left hip: Secondary | ICD-10-CM | POA: Diagnosis not present

## 2023-08-10 DIAGNOSIS — R41841 Cognitive communication deficit: Secondary | ICD-10-CM | POA: Diagnosis not present

## 2023-08-10 DIAGNOSIS — F411 Generalized anxiety disorder: Secondary | ICD-10-CM | POA: Diagnosis not present

## 2023-08-10 DIAGNOSIS — F3176 Bipolar disorder, in full remission, most recent episode depressed: Secondary | ICD-10-CM | POA: Diagnosis not present

## 2023-08-10 DIAGNOSIS — F5105 Insomnia due to other mental disorder: Secondary | ICD-10-CM

## 2023-08-10 DIAGNOSIS — G20A1 Parkinson's disease without dyskinesia, without mention of fluctuations: Secondary | ICD-10-CM | POA: Diagnosis not present

## 2023-08-10 DIAGNOSIS — R2689 Other abnormalities of gait and mobility: Secondary | ICD-10-CM | POA: Insufficient documentation

## 2023-08-10 DIAGNOSIS — R2681 Unsteadiness on feet: Secondary | ICD-10-CM | POA: Diagnosis not present

## 2023-08-10 DIAGNOSIS — R262 Difficulty in walking, not elsewhere classified: Secondary | ICD-10-CM | POA: Insufficient documentation

## 2023-08-10 DIAGNOSIS — R278 Other lack of coordination: Secondary | ICD-10-CM | POA: Insufficient documentation

## 2023-08-10 DIAGNOSIS — R42 Dizziness and giddiness: Secondary | ICD-10-CM | POA: Diagnosis not present

## 2023-08-10 MED ORDER — TRAZODONE HCL 100 MG PO TABS
100.0000 mg | ORAL_TABLET | Freq: Every day | ORAL | 3 refills | Status: DC
Start: 2023-08-10 — End: 2024-08-07

## 2023-08-10 NOTE — Progress Notes (Signed)
BH MD OP Progress Note  08/10/2023 4:51 PM Kendra [name]  MRN:  [id number]  Chief Complaint:  Chief Complaint  Patient presents with   Follow-up   Depression   Anxiety   Medication Refill   Memory Loss   HPI: Kendra [name] is a 77 year old Caucasian female, married, retired, lives in [place], has a history of bipolar disorder, GAD, insomnia, hyperprolactinemia, multifocal stage II breast cancer status post lumpectomy, ovarian cyst removal, Parkinson's disease, bilateral hearing loss was evaluated in office today.  Patient as well as spouse-[name] presented for the appointment.  Spouse provided collateral information.  Patient today appeared to be alert, oriented to person place time situation.  3 word memory immediate 3 out of 3, after 5 minutes 0 out of 3.  Patient does report short-term memory problems.  Patient was able to spell the word 'WORLD' forward and backward.  According to spouse patient does need a lot of support throughout the day.  They have an aid coming in on Tuesdays and Thursdays to help patient, takes her out for her hair appointments and so on.  Patient does struggle with a lot of ambivalence, problems with problem solving, decision making on a regular basis.  She does seem to be unmotivated to get out and walk or do any kind of activities.  However that likely also due to her pain which she struggles with.  The pain does have an impact on her mobility.  Patient seems to have slowed mobility and walks very slowly.  She does continue to be in physical therapy.  Mood wise she does seem to have moments when she is down however not sure if that likely due to her pain as well as other factors like medications.  Patient is compliant on risperidone, Prozac and trazodone.  Patient reports sleep is overall good.  However according to spouse she does takes naps throughout the day.  Currently denies any suicidality, homicidality or perceptual disturbances.  According to spouse  Syn One results came back and it was unremarkable.  Patient does have upcoming appointment tomorrow to follow up with neurology to discuss this further.  She continues to take antiparkinsonian medications-Sinemet.  Denies any other concerns today.  Visit Diagnosis:    ICD-10-CM   1. Bipolar disorder, in full remission, most recent episode depressed (HCC)  F31.76     2. GAD (generalized anxiety disorder)  F41.1     3. Insomnia due to mental condition  F51.05 traZODone (DESYREL) 100 MG tablet   mood, pain      Past Psychiatric History: I have reviewed past psychiatric history from progress note on 03/01/2019.  Past trials of risperidone, Prozac, Seroquel, Ativan.  Past Medical History:  Past Medical History:  Diagnosis Date   Anxiety    Breast cancer (HCC) 03/2018   right breast cancer at 11:00 and 1:00   Bursitis    bilateral hips and knees   Complication of anesthesia    Depression    Family history of breast cancer 03/24/2018   Family history of lung cancer 03/24/2018   History of alcohol dependence (HCC) 2007   no ETOH; resolved since 2007   History of hiatal hernia    History of psychosis 2014   due to sleep disturbance   Hypertension    Parkinson disease (HCC)    Personal history of radiation therapy 06/2018-07/2018   right breast ca   PONV (postoperative nausea and vomiting)     Past Surgical History:  Procedure Laterality Date  BREAST BIOPSY Right 03/14/2018   11:00 DCIS and invasive ductal carcinoma   BREAST BIOPSY Right 03/14/2018   1:00 Invasive ductal carcinoma   BREAST LUMPECTOMY Right 04/27/2018   lumpectomy of 11 and 1:00 cancers, clear margins, negative LN   BREAST LUMPECTOMY WITH RADIOACTIVE SEED AND SENTINEL LYMPH NODE BIOPSY Right 04/27/2018   Procedure: RIGHT BREAST LUMPECTOMY WITH RADIOACTIVE SEED X 2 AND RIGHT SENTINEL LYMPH NODE BIOPSY;  Surgeon: Glenna Fellows, MD;  Location: [facility];  Service: General;  Laterality: Right;    CATARACT EXTRACTION W/PHACO Left 08/30/2019   Procedure: CATARACT EXTRACTION PHACO AND INTRAOCULAR LENS PLACEMENT (IOC) LEFT panoptix toric  01:20.5  12.7%  10.26;  Surgeon: Lockie Mola, MD;  Location: [facility];  Service: Ophthalmology;  Laterality: Left;   CATARACT EXTRACTION W/PHACO Right 09/20/2019   Procedure: CATARACT EXTRACTION PHACO AND INTRAOCULAR LENS PLACEMENT (IOC) RIGHT 5.71  00:36.4  15.7%;  Surgeon: Lockie Mola, MD;  Location: [facility];  Service: Ophthalmology;  Laterality: Right;   LAPAROTOMY N/A 02/10/2018   Procedure: EXPLORATORY LAPAROTOMY;  Surgeon: Shonna Chock, MD;  Location: [facility];  Service: Gynecology;  Laterality: N/A;   MASS EXCISION  01/2018   abdominal   OVARIAN CYST REMOVAL     SALPINGOOPHORECTOMY Bilateral 02/10/2018   Procedure: BILATERAL SALPINGO OOPHORECTOMY; PERITONEAL WASHINGS;  Surgeon: Shonna Chock, MD;  Location: [facility];  Service: Gynecology;  Laterality: Bilateral;   TONSILLECTOMY     TONSILLECTOMY AND ADENOIDECTOMY  1953    Family Psychiatric History: Reviewed family psychiatric history from progress note on 03/01/2019.  Family History:  Family History  Problem Relation Age of Onset   Diabetes Mother    Breast cancer Mother 79   Hypertension Mother    Lung cancer Father        asbestos exposure   Heart disease Brother 75   Breast cancer Cousin 65       paternal cousin   Heart disease Paternal Grandmother 63   Heart disease Paternal Grandfather     Social History: Reviewed social history from progress note on 03/01/2019. Social History   Socioeconomic History   Marital status: Married    Spouse name: robert   Number of children: 0   Years of education: Not on file   Highest education level: Some college, no degree  Occupational History   Occupation: retired Insurance underwriter of education  Tobacco Use   Smoking status: Never   Smokeless tobacco: Never  Vaping Use   Vaping status:  Never Used  Substance and Sexual Activity   Alcohol use: Not Currently    Comment: alcohol dependence prior to 2007   Drug use: Never   Sexual activity: Yes    Partners: Male    Birth control/protection: Surgical  Other Topics Concern   Not on file  Social History Narrative   Not on file   Social Determinants of Health   Financial Resource Strain: Low Risk  (02/25/2023)   Overall Financial Resource Strain (CARDIA)    Difficulty of Paying Living Expenses: Not hard at all  Food Insecurity: No Food Insecurity (08/29/2020)   Hunger Vital Sign    Worried About Running Out of Food in the Last Year: Never true    Ran Out of Food in the Last Year: Never true  Transportation Needs: No Transportation Needs (02/25/2023)   PRAPARE - Administrator, Civil Service (Medical): No    Lack of Transportation (Non-Medical): No  Physical  Activity: Inactive (08/29/2020)   Exercise Vital Sign    Days of Exercise per Week: 0 days    Minutes of Exercise per Session: 0 min  Stress: Stress Concern Present (08/29/2020)   [employer] of Occupational Health - Occupational Stress Questionnaire    Feeling of Stress : To some extent  Social Connections: Moderately Integrated (08/29/2020)   Social Connection and Isolation Panel [NHANES]    Frequency of Communication with Friends and Family: More than three times a week    Frequency of Social Gatherings with Friends and Family: More than three times a week    Attends Religious Services: Never    Database administrator or Organizations: Yes    Attends Banker Meetings: 1 to 4 times per year    Marital Status: Married    Allergies:  Allergies  Allergen Reactions   Hydroxyzine Anaphylaxis    Tongue swollen   Other Other (See Comments)    Food poisoning    Shrimp [Shellfish Allergy] Other (See Comments)    Food poisoning     Metabolic Disorder Labs: Lab Results  Component Value Date   HGBA1C 6.0 (H) 11/06/2022   Lab  Results  Component Value Date   PROLACTIN 78.3 (H) 01/04/2023   PROLACTIN 70.5 (H) 05/12/2022   Lab Results  Component Value Date   CHOL 191 11/06/2022   TRIG 112 11/06/2022   HDL 58 11/06/2022   CHOLHDL 3.3 12/17/2021   LDLCALC 113 (H) 11/06/2022   LDLCALC 113 (H) 12/17/2021   Lab Results  Component Value Date   TSH 1.653 01/04/2023   TSH 2.31 04/13/2019    Therapeutic Level Labs: No results found for: "LITHIUM" No results found for: "VALPROATE" No results found for: "CBMZ"  Current Medications: Current Outpatient Medications  Medication Sig Dispense Refill   Acetaminophen 500 MG capsule Take 1,000 mg by mouth 2 (two) times daily.     AMBULATORY NON FORMULARY MEDICATION 1 Bag by Other route daily. HERO smart dispenser 1 Bag 0   carbidopa-levodopa (SINEMET IR) 25-100 MG tablet Take 2 tablets by mouth with breakfast, with lunch, and with evening meal.     Carbidopa-Levodopa ER (SINEMET CR) 25-100 MG tablet controlled release Take 2 tablets by mouth at bedtime.     cyanocobalamin (VITAMIN B12) 1000 MCG/ML injection INJECT 1 ML INTO THE MUSCLE EVERY 30 DAYS 3 mL 3   denosumab (PROLIA) 60 MG/ML SOSY injection Inject 60 mg into the skin every 6 (six) months. takes annually     FLUoxetine HCl 60 MG TABS TAKE 1 TABLET BY MOUTH DAILY 90 tablet 1   gabapentin (NEURONTIN) 100 MG capsule Take 200 mg by mouth 3 (three) times daily.     letrozole (FEMARA) 2.5 MG tablet TAKE 1 TABLET BY MOUTH DAILY 90 tablet 0   losartan-hydrochlorothiazide (HYZAAR) 50-12.5 MG tablet Take 1 tablet by mouth daily. Stop lisinopril-hctz - this replaced that medication 90 tablet 1   mometasone (ELOCON) 0.1 % lotion Apply topically.     nystatin (MYCOSTATIN/NYSTOP) powder SMARTSIG:1 Application Topical 2-3 Times Daily     risperiDONE (RISPERDAL) 0.5 MG tablet TAKE 1 TABLET BY MOUTH TWICE A DAY 180 tablet 1   traZODone (DESYREL) 100 MG tablet Take 1 tablet (100 mg total) by mouth at bedtime. 90 tablet 3   No  current facility-administered medications for this visit.     Musculoskeletal: Strength & Muscle Tone: within normal limits Gait & Station:  slow Patient leans: N/A  Psychiatric Specialty  Exam: Review of Systems  Psychiatric/Behavioral:  The patient is nervous/anxious.     Blood pressure 112/68, pulse (!) 102, temperature (!) 97.3 F (36.3 C), temperature source Skin, height 5\' 6"  (1.676 m), weight 213 lb 6.4 oz (96.8 kg).Body mass index is 34.44 kg/m.  General Appearance: Casual  Eye Contact:  Good  Speech:  Clear and Coherent  Volume:  Decreased  Mood:  Anxious  Affect:  Congruent  Thought Process:  Goal Directed and Descriptions of Associations: Intact  Orientation:  Full (Time, Place, and Person)  Thought Content: Logical   Suicidal Thoughts:  No  Homicidal Thoughts:  No  Memory:  Immediate;   Fair Recent;   Fair Remote;   Poor  Judgement:  Fair  Insight:  Fair  Psychomotor Activity:  Decreased  Concentration:  Concentration: Fair and Attention Span: Fair  Recall:  Poor  Fund of Knowledge: Fair  Language: Fair  Akathisia:  No  Handed:  Right  AIMS (if indicated): done  Assets:  Communication Skills Desire for Improvement Housing Intimacy Social Support  ADL's:  Intact  Cognition: WNL  Sleep:   Excessive during the day   Screenings: AIMS    Flowsheet Row Office Visit from 08/10/2023 in [facility] Office Visit from 04/08/2023 in [facility] Office Visit from 09/04/2022 in [facility] Office Visit from 06[facility]04/2022 in [facility] Office Visit from 03/02/2022 in [facility]  AIMS Total Score 0 0 0 0 0      GAD-7    Flowsheet Row Office Visit from 08/10/2023 in [facility] Counselor from 03/25/2021 in [facility] Counselor 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 [facility]  Total GAD-7 Score 9 4 7       PHQ2-9    Flowsheet Row Office Visit from 08/10/2023 in [facility] Office Visit from 02/09/2023 in [facility] Office Visit from 01/26/2023 in [facility] Procedure visit from 01/12/2023 in [facility] Visit from 01/04/2023 in [facility]  PHQ-2 Total Score 2 1 0 0 1  PHQ-9 Total Score 17 13 -- -- 8      Flowsheet Row Office Visit from 08/10/2023 in [facility] Office Visit from 04/08/2023 in [facility] Office Visit from 01/04/2023 in [facility]  C-SSRS RISK CATEGORY No Risk No Risk No Risk        Assessment and Plan: Natori Gudino is a 77 year old Caucasian female, married, lives in [place], has a history of bipolar disorder, GAD, Parkinson's disease was evaluated in office today.  Patient with continued symptoms of slowness of movement, likely contributed to by pain as well as excessive sleepiness during the day, will benefit from following plan.  Plan Bipolar disorder in remission We will continue risperidone 0.5 mg p.o. daily in the morning and 0.5 mg at bedtime Continue Prozac 60 mg p.o. daily Will consider adding a mood stabilizer like Lamictal in the future as needed if patient continues to have any worsening mood symptoms.  Her current low mood likely due to her pain as well as being on medications, polypharmacy.  GAD-stable Prozac 60 mg p.o. daily Patient does have support at  home right now.  Will continue to monitor.  Insomnia-stable Patient does report sleep is good at  night. Continue trazodone 100 mg p.o. nightly Melatonin as needed Discussed to work on sleep hygiene to address naps during the day.  Patient to schedule naptime so that she does not do it excessively throughout the day.  May also need to consider taking a look at her medications since medications likely contributing to it.  However currently patient as well as spouse would like to leave her psychotropics where it is until her appointment with neurology which is coming up tomorrow to discuss further steps regarding her treatment of parkinsonian symptoms given her Syn One test is unremarkable.  Collateral information obtained from spouse as noted above.   Collaboration of Care: Collaboration of Care: Other will coordinate care with neurology.  Patient/Guardian was advised Release of Information must be obtained prior to any record release in order to collaborate their care with an outside provider. Patient/Guardian was advised if they have not already done so to contact the registration department to sign all necessary forms in order for [place] to release information regarding their care.   Consent: Patient/Guardian gives verbal consent for treatment and assignment of benefits for services provided during this visit. Patient/Guardian expressed understanding and agreed to proceed.   Follow-up in clinic in 4 weeks or sooner.  This note was generated in part or whole with voice recognition software. Voice recognition is usually quite accurate but there are transcription errors that can and very often do occur. I apologize for any typographical errors that were not detected and corrected.    Jomarie Longs, MD 08/10/2023, 4:52 PM

## 2023-08-11 ENCOUNTER — Other Ambulatory Visit: Payer: Self-pay | Admitting: Psychiatry

## 2023-08-11 ENCOUNTER — Other Ambulatory Visit: Payer: Self-pay | Admitting: Family Medicine

## 2023-08-11 DIAGNOSIS — F5105 Insomnia due to other mental disorder: Secondary | ICD-10-CM

## 2023-08-11 DIAGNOSIS — F319 Bipolar disorder, unspecified: Secondary | ICD-10-CM | POA: Diagnosis not present

## 2023-08-11 DIAGNOSIS — G2401 Drug induced subacute dyskinesia: Secondary | ICD-10-CM | POA: Diagnosis not present

## 2023-08-11 DIAGNOSIS — M25559 Pain in unspecified hip: Secondary | ICD-10-CM | POA: Diagnosis not present

## 2023-08-11 NOTE — Telephone Encounter (Signed)
Requested Prescriptions  Pending Prescriptions Disp Refills   losartan-hydrochlorothiazide (HYZAAR) 50-12.5 MG tablet [Pharmacy Med Name: LOSARTAN-HCTZ 50-12.5 MG TAB] 90 tablet 1    Sig: TAKE 1 TABLET BY MOUTH DAILY. STOP LISINOPRIL-HCTZ- THIS MEDICATION REPLACES THAT     Cardiovascular: ARB + Diuretic Combos Failed - 08/11/2023  6:22 AM      Failed - Na in normal range and within 180 days    Sodium  Date Value Ref Range Status  03/01/2023 131 (L) 135 - 145 mmol/L Final  11/06/2022 138 134 - 144 mmol/L Final         Passed - K in normal range and within 180 days    Potassium  Date Value Ref Range Status  03/01/2023 3.6 3.5 - 5.1 mmol/L Final         Passed - Cr in normal range and within 180 days    Creatinine  Date Value Ref Range Status  03/23/2018 0.89 0.60 - 1.10 mg/dL Final   Creatinine, Ser  Date Value Ref Range Status  03/01/2023 0.85 0.44 - 1.00 mg/dL Final         Passed - eGFR is 10 or above and within 180 days    GFR, Est AFR Am  Date Value Ref Range Status  03/23/2018 >60 >60 mL/min Final    Comment:    (NOTE) The eGFR has been calculated using the CKD EPI equation. This calculation has not been validated in all clinical situations. eGFR's persistently <60 mL/min signify possible Chronic Kidney Disease.    GFR calc Af Amer  Date Value Ref Range Status  04/29/2020 >60 >60 mL/min Final   GFR, Estimated  Date Value Ref Range Status  03/01/2023 >60 >60 mL/min Final    Comment:    (NOTE) Calculated using the CKD-EPI Creatinine Equation (2021)   03/23/2018 >60 >60 mL/min Final   eGFR  Date Value Ref Range Status  11/06/2022 83 >59 mL/min/1.73 Final         Passed - Patient is not pregnant      Passed - Last BP in normal range    BP Readings from Last 1 Encounters:  05/13/23 125/72         Passed - Valid encounter within last 6 months    Recent Outpatient Visits           3 months ago Essential hypertension   [place] [facility] [place], Marzella Schlein, MD   6 months ago Encounter for annual wellness visit (AWV) in Medicare patient   [facility] [place], Marzella Schlein, MD   7 months ago Frequent urination   [place] [facility] [place], Marzella Schlein, MD   9 months ago Essential hypertension   [place] [facility] Erasmo Downer, MD   11 months ago Urinary tract infection without hematuria, site unspecified   [place] [facility] [place], [place], PA-C       Future Appointments             In 3 months Bacigalupo, Marzella Schlein, MD [facility]

## 2023-08-17 ENCOUNTER — Telehealth: Payer: Self-pay | Admitting: Physical Therapy

## 2023-08-17 ENCOUNTER — Ambulatory Visit: Payer: Medicare PPO | Admitting: Physical Therapy

## 2023-08-17 NOTE — Telephone Encounter (Signed)
Pt missed scheduled PT treatment. PT called provided phone number. Spoke with Francena Hanly. PT provided listed of scheduled PT appointments through the beginning of December. States that pt will be at appointment next Tuesday 10/22.   Grier Rocher PT, DPT  Physical Therapist - [facility]  12:26 PM 08/17/23

## 2023-08-24 ENCOUNTER — Ambulatory Visit: Payer: Medicare PPO

## 2023-08-24 DIAGNOSIS — R2681 Unsteadiness on feet: Secondary | ICD-10-CM | POA: Diagnosis not present

## 2023-08-24 DIAGNOSIS — R278 Other lack of coordination: Secondary | ICD-10-CM

## 2023-08-24 DIAGNOSIS — M6281 Muscle weakness (generalized): Secondary | ICD-10-CM

## 2023-08-24 DIAGNOSIS — M25552 Pain in left hip: Secondary | ICD-10-CM | POA: Diagnosis not present

## 2023-08-24 DIAGNOSIS — R262 Difficulty in walking, not elsewhere classified: Secondary | ICD-10-CM

## 2023-08-24 DIAGNOSIS — R42 Dizziness and giddiness: Secondary | ICD-10-CM | POA: Diagnosis not present

## 2023-08-24 DIAGNOSIS — R269 Unspecified abnormalities of gait and mobility: Secondary | ICD-10-CM | POA: Diagnosis not present

## 2023-08-24 DIAGNOSIS — G20A1 Parkinson's disease without dyskinesia, without mention of fluctuations: Secondary | ICD-10-CM | POA: Diagnosis not present

## 2023-08-24 DIAGNOSIS — R41841 Cognitive communication deficit: Secondary | ICD-10-CM | POA: Diagnosis not present

## 2023-08-24 NOTE — Therapy (Signed)
OUTPATIENT PHYSICAL THERAPY TREATMENT     Patient Name: Kendra [name] MRN: [id number] DOB:Feb 19, 1946, 77 y.o., female Today's Date: 08/24/2023  PCP: Erasmo Downer, MD REFERRING PROVIDER: Erasmo Downer, MD  END OF SESSION:  PT End of Session - 08/24/23 1202     Visit Number 68    Number of Visits 74   **Corrected to reflect visit count with recert   Date for PT Re-Evaluation 10/05/23    Authorization Type [employer] P[employer]    Authorization Time Period 06/10/23-08/05/23    Progress Note Due on Visit 70    PT Start Time 1147    PT Stop Time 1229    PT Time Calculation (min) 42 min    Equipment Utilized During Treatment Gait belt    Activity Tolerance Patient tolerated treatment well;Patient limited by fatigue    Behavior During Therapy WFL for tasks assessed/performed                                Past Medical History:  Diagnosis Date   Anxiety    Breast cancer (HCC) 03/2018   right breast cancer at 11:00 and 1:00   Bursitis    bilateral hips and knees   Complication of anesthesia    Depression    Family history of breast cancer 03/24/2018   Family history of lung cancer 03/24/2018   History of alcohol dependence (HCC) 2007   no ETOH; resolved since 2007   History of hiatal hernia    History of psychosis 2014   due to sleep disturbance   Hypertension    Parkinson disease (HCC)    Personal history of radiation therapy 06/2018-07/2018   right breast ca   PONV (postoperative nausea and vomiting)    Past Surgical History:  Procedure Laterality Date   BREAST BIOPSY Right 03/14/2018   11:00 DCIS and invasive ductal carcinoma   BREAST BIOPSY Right 03/14/2018   1:00 Invasive ductal carcinoma   BREAST LUMPECTOMY Right 04/27/2018   lumpectomy of 11 and 1:00 cancers, clear margins, negative LN   BREAST LUMPECTOMY WITH RADIOACTIVE SEED AND SENTINEL LYMPH NODE BIOPSY Right 04/27/2018   Procedure: RIGHT BREAST LUMPECTOMY WITH  RADIOACTIVE SEED X 2 AND RIGHT SENTINEL LYMPH NODE BIOPSY;  Surgeon: Glenna Fellows, MD;  Location: [facility];  Service: General;  Laterality: Right;   CATARACT EXTRACTION W/PHACO Left 08/30/2019   Procedure: CATARACT EXTRACTION PHACO AND INTRAOCULAR LENS PLACEMENT (IOC) LEFT panoptix toric  01:20.5  12.7%  10.26;  Surgeon: Lockie Mola, MD;  [facility];  Service: Ophthalmology;  Laterality: Left;   CATARACT EXTRACTION W/PHACO Right 09/20/2019   Procedure: CATARACT EXTRACTION PHACO AND INTRAOCULAR LENS PLACEMENT (IOC) RIGHT 5.71  00:36.4  15.7%;  Surgeon: Lockie Mola, MD;  Location: [facility];  Service: Ophthalmology;  Laterality: Right;   LAPAROTOMY N/A 02/10/2018   Procedure: EXPLORATORY LAPAROTOMY;  Surgeon: Shonna Chock, MD;  Location: [facility];  Service: Gynecology;  Laterality: N/A;   MASS EXCISION  01/2018   abdominal   OVARIAN CYST REMOVAL     SALPINGOOPHORECTOMY Bilateral 02/10/2018   Procedure: BILATERAL SALPINGO OOPHORECTOMY; PERITONEAL WASHINGS;  Surgeon: Shonna Chock, MD;  Location: [facility];  Service: Gynecology;  Laterality: Bilateral;   TONSILLECTOMY     TONSILLECTOMY AND ADENOIDECTOMY  1953   Patient Active Problem List   Diagnosis Date Noted   Neurogenic bladder 05/14/2023   Prediabetes 12/15/2022  Chronic cough 08/17/2022   History of allergy to shellfish 02/24/2022   Chronic low back pain of over 3 months duration 02/04/2022   Osteoarthritis of  AC (acromioclavicular) joint (Left) 02/04/2022   Osteoarthritis of hip (Right) 02/04/2022   Impaction fracture of distal end of radius and ulna, sequela (Left) 02/04/2022   Osteoarthritis of 1st (thumb) carpometacarpal (CMC) joint of hand (Left) 02/04/2022   Lumbosacral facet arthropathy (Multilevel) (Bilateral) (T12-S1) 02/04/2022   Lumbar central spinal stenosis w/o neurogenic claudication (L2-3, L3-4, L4-5) 02/04/2022   Lumbar foraminal stenosis (Bilateral:  L2-3, L4-5) (Right: (Severe) L4-5) 02/04/2022   Lumbar lateral recess stenosis (Bilateral: L2-3, L3-4, and L4-5) (Severe) 02/04/2022   DDD (degenerative disc disease), lumbar 02/04/2022   Lumbosacral facet syndrome (Multilevel) (Bilateral) 02/04/2022   Osteoarthritis of lumbar spine 02/04/2022   Allergic conjunctivitis of both eyes 12/23/2021   Chronic hip pain (3ry area of Pain) (Bilateral) (L>R) 12/22/2021   Chronic lower extremity pain (2ry area of Pain) (Bilateral) (L>R) 12/22/2021   Abnormal MRI, lumbar spine (01/31/2020) 12/22/2021   Grade 1 Anterolisthesis of lumbar spine (L3/L4) & (5 mm) (L4/L5) 12/22/2021   Chronic pain syndrome 11/17/2021   High risk medication use 11/17/2021   Disorder of skeletal system 11/17/2021   Problems influencing health status 11/17/2021   Other hyperlipidemia 10/21/2021   Polypharmacy 08/12/2021   Chronic low back pain (1ry area of Pain) (Bilateral) (L>R) w/o sciatica 06/18/2021   Osteopenia of neck of femur (Right) 09/06/2020   Obesity 08/29/2020   Parkinson disease (HCC) 02/22/2020   Mucinous cystadenoma 12/24/2019   Bipolar disorder, in full remission, most recent episode depressed (HCC) 11/08/2019   Vitamin D insufficiency 08/24/2019   Insomnia due to mental condition 05/31/2019   Bipolar I disorder, most recent episode depressed (HCC) 05/31/2019   Alcohol use disorder, moderate, in sustained remission (HCC) 05/31/2019   Elevated tumor markers 05/27/2019   GAD (generalized anxiety disorder) 04/10/2019   MDD (major depressive disorder), severe (HCC) 01/31/2019   High serum carbohydrate antigen 19-9 (CA19-9) 12/01/2018   Adjustment disorder with anxious mood 11/24/2018   Essential hypertension 11/24/2018   B12 deficiency 10/17/2018   Incisional hernia, without obstruction or gangrene 09/12/2018   Genetic testing 04/07/2018   Family history of breast cancer 03/24/2018   Family history of lung cancer 03/24/2018   Malignant neoplasm of  upper-outer quadrant of right breast in female, estrogen receptor positive (HCC) 03/17/2018   Malignant neoplasm of upper-inner quadrant of right breast in female, estrogen receptor positive (HCC) 03/17/2018   History of psychosis 02/07/2018   History of insomnia 02/07/2018    ONSET DATE: Pt diagnosed with PD in 2019  REFERRING DIAG:  G20 (ICD-10-CM) - Parkinson's disease      THERAPY DIAG:  Muscle weakness (generalized)  Other lack of coordination  Unsteadiness on feet  Difficulty in walking, not elsewhere classified  Rationale for Evaluation and Treatment: Rehabilitation  SUBJECTIVE:   Pt reports moderate L hip pain currently. Pt reports no stumbles/falls. Pt had her birthday and had a party with her family. Energy level is currently low.  PERTINENT HISTORY:  Pt is a pleasant 77 yo female who was diagnosed with PD in 2019. She is known to clinic and [facility], and was last seen for LSVT BIG Sept-Oct 2022. Pt with chronic pain and has become increasingly sedentary as a result exacerbating mobility difficulties due to PD. Current concerns: Pt reports L and R hip pain and back pain. Pt was being seen by pain management clinic.  Pt has had injections that provided relief at the time. Pt's spouse reports her physician determined surgery was not an option for her pain so the plan is to manage it. Pt spouse thinks pain has resulted in reluctance for pt to move. Pt currently sedentary, doesn't walk much, but when she does she holds on to furniture for support due to balance problems. When pt first stands she must hold on to something due to mix of pain and/or dizziness (described as lightheadedness). Her pain can go from 3/10 to 9/10. It does improve once she starts moving. Pt has increased difficulty with multiple activities including: getting in and out of her bed, getting in and out of her car, freezing, decreased gait speed, decreased balance and cognition, and difficulty using BUE due to  tremors.  Per chart other PMH includes hx of breast cancer, HTN, MCI, insomnia, cataracts, bipolar 1, alcohol use disorder, GAD, MDD, osteopenia of neck of R femur, chronic B low back pain with radiating pain to buttock and posterior thighs and calves. Pt with multiple comorbidities, please refer to chart for full details    PAIN: from eval Are you having pain? Yes 7/10 Left hip   PRECAUTIONS: Fall  WEIGHT BEARING RESTRICTIONS: No  FALLS: Has patient fallen in last 6 months? Yes. Number of falls 1x, leg tangled in chair leg when getting up from a chair  PLOF: Independent with basic ADLs  PATIENT GOALS: improve mobility  OBJECTIVE   TODAY'S TREATMENT:                                                                                                                                         DATE: 08/24/23   Gait belt donned throughout for pt safety  TE: Gait for endurance with 4WW, 3# AW each LE CGA  3x148 ft, rest breaks taken between sets to reduce fatigue STS 3x10  Pt rates medium  Seated thoracic ext over chair 10x  Seated thoracic twists in chair 2x10 each way  LAQ 2x10 each LE   NMR:  Large amplitude movement training: -Standing with UE support - big step to the side 10x each way    -- Pt then adds in ipsilateral UE reach with step 10x each way   Standing big reach up/big posture 10x each UE  Seated FWD reach>reach to floor>reach up> reach out to the sides, emphasis on large-amplitude movement x10 with 5 sec hold/rep. Pt states intervention is tiring in BUE   Seated reach and twist 10x each way. Pt continues to report fatigue with intervention, rates L side more difficult   FWD step and FWD reach 10x each side      PATIENT EDUCATION:  Education details: Pt educated throughout session about proper posture and technique with exercises. Improved exercise technique, movement at target joints, use of target muscles after min to mod verbal, visual, tactile cues.  Person  educated: Patient and Spouse Education method: Explanation, Demonstration, Verbal cues, and Handouts Education comprehension: verbalized understanding, returned demonstration, verbal cues required, tactile cues required, and needs further education    HOME EXERCISE PROGRAM:   8//20/24: Access Code: [email or web address] URL: [email or web address] Date: 06/22/2023 Prepared by: Temple Pacini  Exercises - Sit to Stand with Counter Support  - 1 x daily - 4-6 x weekly - 2 sets - 10 reps - Seated Long Arc Quad  - 1 x daily - 4-6 x weekly - 2 sets - 12 reps - Seated Heel Raise  - 1 x daily - 4-6 x weekly - 1 sets - 20 reps    GOALS: Goals reviewed with patient? Yes  SHORT TERM GOALS: Target date: 04/27/2023    Patient will be independent in home exercise program to improve strength/mobility for better functional independence with ADLs. Baseline: LSVT BIG protocol to be initiated next session; 12/18: not yet fully indep, reports she still gets confused about the technique; 12/03/2022: HEP to be updated; Pt reports practicing HEP 2x/week; 02/23/23: not currently performing HEP, reviewed in session and provided new copy; 03/16/2023: pt reports performing HEP every day; 03/30/23: Pt reports she is not being consistent with her HEP; 7/1: pt reports she is not doing much at home; 06/10/23: pt unsure how often she is exercising at home; 10/8: pt repots unsure about her HEP Goal status: MET (previously MET)   LONG TERM GOALS: Target date: 06/08/2023     Patient will increase FOTO score to equal to or greater than 49 to demonstrate statistically significant improvement in mobility and quality of life.  Baseline: 40; 12/18: deferred due to time; 45; 3/19: deferred to next visit; 01/21/2023; 02/23/2023: 46 unchanged from previous assessment); 03/17/2023: 48; 06/10/23: 52 Goal status: MET  2.  Patient (> 74 years old) will complete five times sit to stand test in < 15 seconds indicating decreased fall risk.   Baseline: 27 sec; 12/18 23 sec hands-free; 11/04/22: 24 sec hands free; 2/1/124: 22.7 sec use of BUE, 21.3 sec hands-free; 01/19/23: 18 seconds hands-free; 02/23/23: 19 seconds hands-free; 03/16/2023: 18 seconds hands-free; 03/30/23: 17.3 seconds hands-free; 05/03/23: 17.3 seconds hands-free; 06/10/23: 19 seconds hands-free; 10/8: 16 seconds hands-free  Goal status: PARTIALLY MET  3.  Patient will increase 10 meter walk test to >1.42m/s with WFL B step-length, upright posture for >90% of test to improve ease with gait and to override hypokentic and bradykinetic movement.  Baseline: 0.58 m/s with 2ZD; 12/18: 0.7 m/s with 6UY; 11/04/22: 0.69 m/s with 4IH;  12/03/22: 0.73 m/s with 4VQ; 01/19/2023: 0.77 m/s with 2VZ; 02/23/2023: 0.83 m/s with 5GL; 03/16/2023: 0.83 m/s with 8VF , pt still somewhat crouched, requires cues for upright posture >50% of time; 03/30/23: 0.83 m/s with crouched posture; 05/03/23 0.8 m/s with RW, some improvement with upright posture; 06/10/23:  0.77 m/s with 4WW and crouched posture; 10/8: 0.74 m/s 4WW Goal status: IN PROGRESS  4.  Patient will reduce time to don jacket by at least 5 seconds in order to increase ease with dressing and override bradykinetic movement Baseline: 17 seconds; 12/18: 12 seconds Goal status: MET  5.  Pt will demo or report ability to open standard jar using only 1-2 attempts in order to exhibit improved functional mobility for ADLs.  Baseline: 5.7 seconds with multiple attempts to turn jar lid to open; 12/18: 2.8 sec opens in 2 turns Goal status: MET  6.  Patient will increase Berg Balance score by > 6 points to demonstrate  decreased fall risk during functional activities. Baseline: to be tested next 1-2 visits; 12/03/2022: 44/56; 01/21/23 45/56; 48/56; 03/17/2023: 48/56; 05/03/23: 50/56; 8/8: 48/56; 08/10/2023: deferred  Goal status: Partially met   7.  Patient will report at least 4 days in a row with minimal to no L hip pain in order to improve QOL and ease/safety with  functional mobility. Baseline: Pt reports pain majority of days in L hip; 12/03/2022: pt reports having hip pain about every day; 3/19: rates hip pain daily (being seen now by MD, new imaging); 02/23/2023: pt went for period of time without hip pain (she thinks a week), but reports it has now come back,; 5/28: pt reports hip pain "every other day"; 05/03/23: pt reports pain in hip every day in past week Goal status: NOT MET/discontinued   8. Patient will increase six minute walk test distance to >1000 for progression to community ambulator and improve gait ability Baseline:02/23/2023: 722 ft with 4WW and two standing rest breaks, rates test as hard; 03/16/2023: deferred to next visit due to time; 5/28/20204: 641 ft with 4WW, pt requests to end test early due to fatigue at 5 min 35 sec; 05/03/23: 645 ft with 4WW and able to complete full test; 06/10/23: 708 ft; 08/10/23: 632 ft with 3 rest breaks  Goal status: IN PROGRESS  ASSESSMENT:  Continued plan with focus on large-amplitude movement training and endurance. Pt tolerates these fair today, but is still limited by fatigue and is provided rest breaks throughout. The pt will benefit from further skilled PT to improve strength, balance, gait and mobility.  OBJECTIVE IMPAIRMENTS: Abnormal gait, decreased balance, decreased cognition, decreased coordination, decreased endurance, decreased knowledge of use of DME, decreased mobility, difficulty walking, decreased strength, dizziness, hypomobility, impaired UE functional use, improper body mechanics, postural dysfunction, and pain.   ACTIVITY LIMITATIONS: lifting, bending, standing, squatting, stairs, transfers, bed mobility, bathing, dressing, hygiene/grooming, and locomotion level  PARTICIPATION LIMITATIONS: meal prep, cleaning, laundry, medication management, personal finances, driving, shopping, community activity, and yard work  PERSONAL FACTORS: Age, Fitness, Sex, Time since onset of  injury/illness/exacerbation, and 3+ comorbidities: PMH includes hx of breast cancer, HTN, MCI, insomnia, cataracts, bipolar 1, alcohol use disorder, GAD, MDD, osteopenia of neck of R femur, chronic B low back pain with radiating pain to buttock and posterior thighs and calves. Pt with multiple comorbidities, please refer to chart for full details  are also affecting patient's functional outcome.   REHAB POTENTIAL: Fair    CLINICAL DECISION MAKING: Evolving/moderate complexity  EVALUATION COMPLEXITY: High  PLAN:  PT FREQUENCY: 1-2x/week  PT DURATION: 8 weeks  PLANNED INTERVENTIONS: Therapeutic exercises, Therapeutic activity, Neuromuscular re-education, Balance training, Gait training, Patient/Family education, Self Care, Joint mobilization, Stair training, Vestibular training, Canalith repositioning, Visual/preceptual remediation/compensation, Orthotic/Fit training, DME instructions, Wheelchair mobility training, Spinal mobilization, Cryotherapy, Moist heat, Splintting, Taping, and Manual therapy  PLAN FOR NEXT SESSION: endurance, higher level balance in // bars   Temple Pacini PT, DPT  Physical Therapist - [facility]  [facility]  08/24/2023, 4:29 PM

## 2023-08-31 ENCOUNTER — Ambulatory Visit: Payer: Medicare PPO

## 2023-08-31 ENCOUNTER — Inpatient Hospital Stay: Payer: Medicare PPO | Attending: Internal Medicine | Admitting: Nurse Practitioner

## 2023-08-31 ENCOUNTER — Inpatient Hospital Stay: Payer: Medicare PPO

## 2023-08-31 ENCOUNTER — Encounter: Payer: Self-pay | Admitting: Nurse Practitioner

## 2023-08-31 VITALS — BP 132/90 | HR 78 | Temp 98.5°F | Resp 19 | Wt 218.3 lb

## 2023-08-31 DIAGNOSIS — Z170 Estrogen receptor positive status [ER+]: Secondary | ICD-10-CM

## 2023-08-31 DIAGNOSIS — Z79899 Other long term (current) drug therapy: Secondary | ICD-10-CM | POA: Insufficient documentation

## 2023-08-31 DIAGNOSIS — Z923 Personal history of irradiation: Secondary | ICD-10-CM | POA: Diagnosis not present

## 2023-08-31 DIAGNOSIS — F419 Anxiety disorder, unspecified: Secondary | ICD-10-CM | POA: Diagnosis not present

## 2023-08-31 DIAGNOSIS — M25552 Pain in left hip: Secondary | ICD-10-CM

## 2023-08-31 DIAGNOSIS — G8929 Other chronic pain: Secondary | ICD-10-CM | POA: Insufficient documentation

## 2023-08-31 DIAGNOSIS — R262 Difficulty in walking, not elsewhere classified: Secondary | ICD-10-CM | POA: Diagnosis not present

## 2023-08-31 DIAGNOSIS — M6281 Muscle weakness (generalized): Secondary | ICD-10-CM

## 2023-08-31 DIAGNOSIS — R5383 Other fatigue: Secondary | ICD-10-CM | POA: Diagnosis not present

## 2023-08-31 DIAGNOSIS — R2689 Other abnormalities of gait and mobility: Secondary | ICD-10-CM

## 2023-08-31 DIAGNOSIS — C50411 Malignant neoplasm of upper-outer quadrant of right female breast: Secondary | ICD-10-CM | POA: Diagnosis not present

## 2023-08-31 DIAGNOSIS — Z90722 Acquired absence of ovaries, bilateral: Secondary | ICD-10-CM | POA: Insufficient documentation

## 2023-08-31 DIAGNOSIS — M8580 Other specified disorders of bone density and structure, unspecified site: Secondary | ICD-10-CM | POA: Diagnosis not present

## 2023-08-31 DIAGNOSIS — E538 Deficiency of other specified B group vitamins: Secondary | ICD-10-CM | POA: Insufficient documentation

## 2023-08-31 DIAGNOSIS — Z803 Family history of malignant neoplasm of breast: Secondary | ICD-10-CM | POA: Diagnosis not present

## 2023-08-31 DIAGNOSIS — R2681 Unsteadiness on feet: Secondary | ICD-10-CM | POA: Diagnosis not present

## 2023-08-31 DIAGNOSIS — Z8249 Family history of ischemic heart disease and other diseases of the circulatory system: Secondary | ICD-10-CM | POA: Insufficient documentation

## 2023-08-31 DIAGNOSIS — R278 Other lack of coordination: Secondary | ICD-10-CM

## 2023-08-31 DIAGNOSIS — I10 Essential (primary) hypertension: Secondary | ICD-10-CM | POA: Diagnosis not present

## 2023-08-31 DIAGNOSIS — Z801 Family history of malignant neoplasm of trachea, bronchus and lung: Secondary | ICD-10-CM | POA: Diagnosis not present

## 2023-08-31 DIAGNOSIS — M549 Dorsalgia, unspecified: Secondary | ICD-10-CM | POA: Insufficient documentation

## 2023-08-31 DIAGNOSIS — G20A1 Parkinson's disease without dyskinesia, without mention of fluctuations: Secondary | ICD-10-CM | POA: Diagnosis not present

## 2023-08-31 DIAGNOSIS — R296 Repeated falls: Secondary | ICD-10-CM

## 2023-08-31 DIAGNOSIS — Z833 Family history of diabetes mellitus: Secondary | ICD-10-CM | POA: Insufficient documentation

## 2023-08-31 DIAGNOSIS — Z9089 Acquired absence of other organs: Secondary | ICD-10-CM | POA: Insufficient documentation

## 2023-08-31 DIAGNOSIS — C50211 Malignant neoplasm of upper-inner quadrant of right female breast: Secondary | ICD-10-CM | POA: Diagnosis not present

## 2023-08-31 DIAGNOSIS — Z86018 Personal history of other benign neoplasm: Secondary | ICD-10-CM | POA: Insufficient documentation

## 2023-08-31 DIAGNOSIS — R42 Dizziness and giddiness: Secondary | ICD-10-CM | POA: Diagnosis not present

## 2023-08-31 DIAGNOSIS — R41841 Cognitive communication deficit: Secondary | ICD-10-CM

## 2023-08-31 DIAGNOSIS — R269 Unspecified abnormalities of gait and mobility: Secondary | ICD-10-CM

## 2023-08-31 DIAGNOSIS — Z79811 Long term (current) use of aromatase inhibitors: Secondary | ICD-10-CM | POA: Diagnosis not present

## 2023-08-31 DIAGNOSIS — M85851 Other specified disorders of bone density and structure, right thigh: Secondary | ICD-10-CM

## 2023-08-31 DIAGNOSIS — Z5181 Encounter for therapeutic drug level monitoring: Secondary | ICD-10-CM | POA: Diagnosis not present

## 2023-08-31 DIAGNOSIS — M2550 Pain in unspecified joint: Secondary | ICD-10-CM | POA: Insufficient documentation

## 2023-08-31 DIAGNOSIS — Z08 Encounter for follow-up examination after completed treatment for malignant neoplasm: Secondary | ICD-10-CM

## 2023-08-31 LAB — CBC WITH DIFFERENTIAL (CANCER CENTER ONLY)
Abs Immature Granulocytes: 0.06 10*3/uL (ref 0.00–0.07)
Basophils Absolute: 0.1 10*3/uL (ref 0.0–0.1)
Basophils Relative: 1 %
Eosinophils Absolute: 0.1 10*3/uL (ref 0.0–0.5)
Eosinophils Relative: 1 %
HCT: 42.6 % (ref 36.0–46.0)
Hemoglobin: 14.1 g/dL (ref 12.0–15.0)
Immature Granulocytes: 1 %
Lymphocytes Relative: 12 %
Lymphs Abs: 0.9 10*3/uL (ref 0.7–4.0)
MCH: 29 pg (ref 26.0–34.0)
MCHC: 33.1 g/dL (ref 30.0–36.0)
MCV: 87.7 fL (ref 80.0–100.0)
Monocytes Absolute: 0.4 10*3/uL (ref 0.1–1.0)
Monocytes Relative: 5 %
Neutro Abs: 6.4 10*3/uL (ref 1.7–7.7)
Neutrophils Relative %: 80 %
Platelet Count: 157 10*3/uL (ref 150–400)
RBC: 4.86 MIL/uL (ref 3.87–5.11)
RDW: 14.2 % (ref 11.5–15.5)
WBC Count: 8 10*3/uL (ref 4.0–10.5)
nRBC: 0 % (ref 0.0–0.2)

## 2023-08-31 LAB — CMP (CANCER CENTER ONLY)
ALT: 14 U/L (ref 0–44)
AST: 25 U/L (ref 15–41)
Albumin: 4.1 g/dL (ref 3.5–5.0)
Alkaline Phosphatase: 32 U/L — ABNORMAL LOW (ref 38–126)
Anion gap: 11 (ref 5–15)
BUN: 10 mg/dL (ref 8–23)
CO2: 23 mmol/L (ref 22–32)
Calcium: 9.3 mg/dL (ref 8.9–10.3)
Chloride: 99 mmol/L (ref 98–111)
Creatinine: 0.7 mg/dL (ref 0.44–1.00)
GFR, Estimated: >60 mL/min (ref >60)
Glucose, Bld: 151 mg/dL — ABNORMAL HIGH (ref 70–99)
Potassium: 3.9 mmol/L (ref 3.5–5.1)
Sodium: 133 mmol/L — ABNORMAL LOW (ref 135–145)
Total Bilirubin: 0.9 mg/dL (ref 0.3–1.2)
Total Protein: 6.7 g/dL (ref 6.5–8.1)

## 2023-08-31 MED ORDER — LETROZOLE 2.5 MG PO TABS: 2.5000 mg | ORAL_TABLET | Freq: Every day | ORAL | Status: AC

## 2023-08-31 MED ORDER — DENOSUMAB 60 MG/ML ~~LOC~~ SOSY
60.0000 mg | PREFILLED_SYRINGE | Freq: Once | SUBCUTANEOUS | Status: AC
  Administered 2023-08-31: 60 mg via SUBCUTANEOUS
  Filled 2023-08-31: qty 1

## 2023-08-31 NOTE — Addendum Note (Signed)
Addended by: Ashley Royalty A on: 08/31/2023 01:55 PM   Modules accepted: Orders

## 2023-08-31 NOTE — Therapy (Signed)
OUTPATIENT PHYSICAL THERAPY TREATMENT     Patient Name: Kendra [name] MRN: [id number] DOB:Mar 26, 1946, 77 y.o., female Today's Date: 08/31/2023  PCP: Erasmo Downer, MD REFERRING PROVIDER: Erasmo Downer, MD  END OF SESSION:  PT End of Session - 08/31/23 1105     Visit Number 69    Number of Visits 74    Date for PT Re-Evaluation 10/05/23    Authorization Type [employer] PPO    Authorization Time Period 06/10/23-08/05/23    Progress Note Due on Visit 70    PT Start Time 1103    PT Stop Time 1136    PT Time Calculation (min) 33 min    Equipment Utilized During Treatment Gait belt    Activity Tolerance Patient tolerated treatment well;Patient limited by fatigue    Behavior During Therapy WFL for tasks assessed/performed                                Past Medical History:  Diagnosis Date   Anxiety    Breast cancer (HCC) 03/2018   right breast cancer at 11:00 and 1:00   Bursitis    bilateral hips and knees   Complication of anesthesia    Depression    Family history of breast cancer 03/24/2018   Family history of lung cancer 03/24/2018   History of alcohol dependence (HCC) 2007   no ETOH; resolved since 2007   History of hiatal hernia    History of psychosis 2014   due to sleep disturbance   Hypertension    Parkinson disease (HCC)    Personal history of radiation therapy 06/2018-07/2018   right breast ca   PONV (postoperative nausea and vomiting)    Past Surgical History:  Procedure Laterality Date   BREAST BIOPSY Right 03/14/2018   11:00 DCIS and invasive ductal carcinoma   BREAST BIOPSY Right 03/14/2018   1:00 Invasive ductal carcinoma   BREAST LUMPECTOMY Right 04/27/2018   lumpectomy of 11 and 1:00 cancers, clear margins, negative LN   BREAST LUMPECTOMY WITH RADIOACTIVE SEED AND SENTINEL LYMPH NODE BIOPSY Right 04/27/2018   Procedure: RIGHT BREAST LUMPECTOMY WITH RADIOACTIVE SEED X 2 AND RIGHT SENTINEL LYMPH NODE  BIOPSY;  Surgeon: Glenna Fellows, MD;  Location: [facility];  Service: General;  Laterality: Right;   CATARACT EXTRACTION W/PHACO Left 08/30/2019   Procedure: CATARACT EXTRACTION PHACO AND INTRAOCULAR LENS PLACEMENT (IOC) LEFT panoptix toric  01:20.5  12.7%  10.26;  Surgeon: Lockie Mola, MD;  Location: [facility];  Service: Ophthalmology;  Laterality: Left;   CATARACT EXTRACTION W/PHACO Right 09/20/2019   Procedure: CATARACT EXTRACTION PHACO AND INTRAOCULAR LENS PLACEMENT (IOC) RIGHT 5.71  00:36.4  15.7%;  Surgeon: Lockie Mola, MD;  Location: [facility];  Service: Ophthalmology;  Laterality: Right;   LAPAROTOMY N/A 02/10/2018   Procedure: EXPLORATORY LAPAROTOMY;  Surgeon: Shonna Chock, MD;  Location: [facility];  Service: Gynecology;  Laterality: N/A;   MASS EXCISION  01/2018   abdominal   OVARIAN CYST REMOVAL     SALPINGOOPHORECTOMY Bilateral 02/10/2018   Procedure: BILATERAL SALPINGO OOPHORECTOMY; PERITONEAL WASHINGS;  Surgeon: Shonna Chock, MD;  Location: [facility];  Service: Gynecology;  Laterality: Bilateral;   TONSILLECTOMY     TONSILLECTOMY AND ADENOIDECTOMY  1953   Patient Active Problem List   Diagnosis Date Noted   Neurogenic bladder 05/14/2023   Prediabetes 12/15/2022   Chronic cough 08/17/2022   History of  allergy to shellfish 02/24/2022   Chronic low back pain of over 3 months duration 02/04/2022   Osteoarthritis of  AC (acromioclavicular) joint (Left) 02/04/2022   Osteoarthritis of hip (Right) 02/04/2022   Impaction fracture of distal end of radius and ulna, sequela (Left) 02/04/2022   Osteoarthritis of 1st (thumb) carpometacarpal (CMC) joint of hand (Left) 02/04/2022   Lumbosacral facet arthropathy (Multilevel) (Bilateral) (T12-S1) 02/04/2022   Lumbar central spinal stenosis w/o neurogenic claudication (L2-3, L3-4, L4-5) 02/04/2022   Lumbar foraminal stenosis (Bilateral: L2-3, L4-5) (Right: (Severe) L4-5) 02/04/2022    Lumbar lateral recess stenosis (Bilateral: L2-3, L3-4, and L4-5) (Severe) 02/04/2022   DDD (degenerative disc disease), lumbar 02/04/2022   Lumbosacral facet syndrome (Multilevel) (Bilateral) 02/04/2022   Osteoarthritis of lumbar spine 02/04/2022   Allergic conjunctivitis of both eyes 12/23/2021   Chronic hip pain (3ry area of Pain) (Bilateral) (L>R) 12/22/2021   Chronic lower extremity pain (2ry area of Pain) (Bilateral) (L>R) 12/22/2021   Abnormal MRI, lumbar spine (01/31/2020) 12/22/2021   Grade 1 Anterolisthesis of lumbar spine (L3/L4) & (5 mm) (L4/L5) 12/22/2021   Chronic pain syndrome 11/17/2021   High risk medication use 11/17/2021   Disorder of skeletal system 11/17/2021   Problems influencing health status 11/17/2021   Other hyperlipidemia 10/21/2021   Polypharmacy 08/12/2021   Chronic low back pain (1ry area of Pain) (Bilateral) (L>R) w/o sciatica 06/18/2021   Osteopenia of neck of femur (Right) 09/06/2020   Obesity 08/29/2020   Parkinson disease (HCC) 02/22/2020   Mucinous cystadenoma 12/24/2019   Bipolar disorder, in full remission, most recent episode depressed (HCC) 11/08/2019   Vitamin D insufficiency 08/24/2019   Insomnia due to mental condition 05/31/2019   Bipolar I disorder, most recent episode depressed (HCC) 05/31/2019   Alcohol use disorder, moderate, in sustained remission (HCC) 05/31/2019   Elevated tumor markers 05/27/2019   GAD (generalized anxiety disorder) 04/10/2019   MDD (major depressive disorder), severe (HCC) 01/31/2019   High serum carbohydrate antigen 19-9 (CA19-9) 12/01/2018   Adjustment disorder with anxious mood 11/24/2018   Essential hypertension 11/24/2018   B12 deficiency 10/17/2018   Incisional hernia, without obstruction or gangrene 09/12/2018   Genetic testing 04/07/2018   Family history of breast cancer 03/24/2018   Family history of lung cancer 03/24/2018   Malignant neoplasm of upper-outer quadrant of right breast in female, estrogen  receptor positive (HCC) 03/17/2018   Malignant neoplasm of upper-inner quadrant of right breast in female, estrogen receptor positive (HCC) 03/17/2018   History of psychosis 02/07/2018   History of insomnia 02/07/2018    ONSET DATE: Pt diagnosed with PD in 2019  REFERRING DIAG:  G20 (ICD-10-CM) - Parkinson's disease      THERAPY DIAG:  Muscle weakness (generalized)  Other lack of coordination  Unsteadiness on feet  Difficulty in walking, not elsewhere classified  Pain in left hip  Dizziness and giddiness  Parkinson's disease, unspecified whether dyskinesia present, unspecified whether manifestations fluctuate (HCC)  Cognitive communication deficit  Abnormality of gait and mobility  Other abnormalities of gait and mobility  Repeated falls  Rationale for Evaluation and Treatment: Rehabilitation  SUBJECTIVE:   Pt reported feeling fatigued today, as she has an appointment with the cancer center after today's appointment. Pt reported L hip pain and is taking acetaminophen to help alleviate the pain.   PERTINENT HISTORY:  Pt is a pleasant 77 yo female who was diagnosed with PD in 2019. She is known to clinic and [facility], and was last seen for LSVT BIG Sept-Oct  2022. Pt with chronic pain and has become increasingly sedentary as a result exacerbating mobility difficulties due to PD. Current concerns: Pt reports L and R hip pain and back pain. Pt was being seen by pain management clinic. Pt has had injections that provided relief at the time. Pt's spouse reports her physician determined surgery was not an option for her pain so the plan is to manage it. Pt spouse thinks pain has resulted in reluctance for pt to move. Pt currently sedentary, doesn't walk much, but when she does she holds on to furniture for support due to balance problems. When pt first stands she must hold on to something due to mix of pain and/or dizziness (described as lightheadedness). Her pain can go from 3/10 to  9/10. It does improve once she starts moving. Pt has increased difficulty with multiple activities including: getting in and out of her bed, getting in and out of her car, freezing, decreased gait speed, decreased balance and cognition, and difficulty using BUE due to tremors.  Per chart other PMH includes hx of breast cancer, HTN, MCI, insomnia, cataracts, bipolar 1, alcohol use disorder, GAD, MDD, osteopenia of neck of R femur, chronic B low back pain with radiating pain to buttock and posterior thighs and calves. Pt with multiple comorbidities, please refer to chart for full details    PAIN: from eval Are you having pain? Yes 7/10 Left hip   PRECAUTIONS: Fall  WEIGHT BEARING RESTRICTIONS: No  FALLS: Has patient fallen in last 6 months? Yes. Number of falls 1x, leg tangled in chair leg when getting up from a chair  PLOF: Independent with basic ADLs  PATIENT GOALS: improve mobility  OBJECTIVE   TODAY'S TREATMENT:                                                                                                                                         DATE: 08/31/23   Gait belt donned throughout for pt safety  TE: Seated marches 10 reps each LE Sidestepping with overhead reach 3# AW 10 reps each side STS with overhead reach 10 reps Standing lateral sidesteps with UE extension 3# AW 5 reps each side  Ambulation 333ft 3# AW with rollator Seated rows YTB 12 reps Standing rows YTB 12 reps Standing shoulder extension YTB 2x12        PATIENT EDUCATION:  Education details: Pt educated throughout session about proper posture and technique with exercises. Improved exercise technique, movement at target joints, use of target muscles after min to mod verbal, visual, tactile cues.  Person educated: Patient and Spouse Education method: Explanation, Demonstration, Verbal cues, and Handouts Education comprehension: verbalized understanding, returned demonstration, verbal cues required,  tactile cues required, and needs further education    HOME EXERCISE PROGRAM:   8//20/24: Access Code: [email or web address] URL: [email or web address] Date: 06/22/2023 Prepared by: Temple Pacini  Exercises - Sit to Stand with Counter  Support  - 1 x daily - 4-6 x weekly - 2 sets - 10 reps - Seated Long Arc Quad  - 1 x daily - 4-6 x weekly - 2 sets - 12 reps - Seated Heel Raise  - 1 x daily - 4-6 x weekly - 1 sets - 20 reps    GOALS: Goals reviewed with patient? Yes  SHORT TERM GOALS: Target date: 04/27/2023    Patient will be independent in home exercise program to improve strength/mobility for better functional independence with ADLs. Baseline: LSVT BIG protocol to be initiated next session; 12/18: not yet fully indep, reports she still gets confused about the technique; 12/03/2022: HEP to be updated; Pt reports practicing HEP 2x/week; 02/23/23: not currently performing HEP, reviewed in session and provided new copy; 03/16/2023: pt reports performing HEP every day; 03/30/23: Pt reports she is not being consistent with her HEP; 7/1: pt reports she is not doing much at home; 06/10/23: pt unsure how often she is exercising at home; 10/8: pt repots unsure about her HEP Goal status: MET (previously MET)   LONG TERM GOALS: Target date: 06/08/2023     Patient will increase FOTO score to equal to or greater than 49 to demonstrate statistically significant improvement in mobility and quality of life.  Baseline: 40; 12/18: deferred due to time; 45; 3/19: deferred to next visit; 01/21/2023; 02/23/2023: 46 unchanged from previous assessment); 03/17/2023: 48; 06/10/23: 52 Goal status: MET  2.  Patient (> 74 years old) will complete five times sit to stand test in < 15 seconds indicating decreased fall risk.  Baseline: 27 sec; 12/18 23 sec hands-free; 11/04/22: 24 sec hands free; 2/1/124: 22.7 sec use of BUE, 21.3 sec hands-free; 01/19/23: 18 seconds hands-free; 02/23/23: 19 seconds hands-free; 03/16/2023: 18  seconds hands-free; 03/30/23: 17.3 seconds hands-free; 05/03/23: 17.3 seconds hands-free; 06/10/23: 19 seconds hands-free; 10/8: 16 seconds hands-free  Goal status: PARTIALLY MET  3.  Patient will increase 10 meter walk test to >1.32m/s with WFL B step-length, upright posture for >90% of test to improve ease with gait and to override hypokentic and bradykinetic movement.  Baseline: 0.58 m/s with 2NF; 12/18: 0.7 m/s with 6OZ; 11/04/22: 0.69 m/s with 3YQ;  12/03/22: 0.73 m/s with 6VH; 01/19/2023: 0.77 m/s with 8IO; 02/23/2023: 0.83 m/s with 9GE; 03/16/2023: 0.83 m/s with 9BM , pt still somewhat crouched, requires cues for upright posture >50% of time; 03/30/23: 0.83 m/s with crouched posture; 05/03/23 0.8 m/s with RW, some improvement with upright posture; 06/10/23:  0.77 m/s with 4WW and crouched posture; 10/8: 0.74 m/s 4WW Goal status: IN PROGRESS  4.  Patient will reduce time to don jacket by at least 5 seconds in order to increase ease with dressing and override bradykinetic movement Baseline: 17 seconds; 12/18: 12 seconds Goal status: MET  5.  Pt will demo or report ability to open standard jar using only 1-2 attempts in order to exhibit improved functional mobility for ADLs.  Baseline: 5.7 seconds with multiple attempts to turn jar lid to open; 12/18: 2.8 sec opens in 2 turns Goal status: MET  6.  Patient will increase Berg Balance score by > 6 points to demonstrate decreased fall risk during functional activities. Baseline: to be tested next 1-2 visits; 12/03/2022: 44/56; 01/21/23 45/56; 48/56; 03/17/2023: 48/56; 05/03/23: 50/56; 8/8: 48/56; 08/10/2023: deferred  Goal status: Partially met   7.  Patient will report at least 4 days in a row with minimal to no L hip pain in order to improve  QOL and ease/safety with functional mobility. Baseline: Pt reports pain majority of days in L hip; 12/03/2022: pt reports having hip pain about every day; 3/19: rates hip pain daily (being seen now by MD, new imaging); 02/23/2023: pt  went for period of time without hip pain (she thinks a week), but reports it has now come back,; 5/28: pt reports hip pain "every other day"; 05/03/23: pt reports pain in hip every day in past week Goal status: NOT MET/discontinued   8. Patient will increase six minute walk test distance to >1000 for progression to community ambulator and improve gait ability Baseline:02/23/2023: 722 ft with 4WW and two standing rest breaks, rates test as hard; 03/16/2023: deferred to next visit due to time; 5/28/20204: 641 ft with 4WW, pt requests to end test early due to fatigue at 5 min 35 sec; 05/03/23: 645 ft with 4WW and able to complete full test; 06/10/23: 708 ft; 08/10/23: 632 ft with 3 rest breaks  Goal status: IN PROGRESS  ASSESSMENT:  Although the pt reported fatigue throughout the session, the pt responded well to treatment today. Pt was motivated and compliant to complete all tasks during today's session, despite needing rest breaks due to fatigue. She did exhibit difficulty with coordination requiring close CGA and repetitive VC for correct technique. The pt will benefit from further skilled PT to improve strength, balance, gait and mobility for improved quality of life and decreased risk of falling.   OBJECTIVE IMPAIRMENTS: Abnormal gait, decreased balance, decreased cognition, decreased coordination, decreased endurance, decreased knowledge of use of DME, decreased mobility, difficulty walking, decreased strength, dizziness, hypomobility, impaired UE functional use, improper body mechanics, postural dysfunction, and pain.   ACTIVITY LIMITATIONS: lifting, bending, standing, squatting, stairs, transfers, bed mobility, bathing, dressing, hygiene/grooming, and locomotion level  PARTICIPATION LIMITATIONS: meal prep, cleaning, laundry, medication management, personal finances, driving, shopping, community activity, and yard work  PERSONAL FACTORS: Age, Fitness, Sex, Time since onset of injury/illness/exacerbation,  and 3+ comorbidities: PMH includes hx of breast cancer, HTN, MCI, insomnia, cataracts, bipolar 1, alcohol use disorder, GAD, MDD, osteopenia of neck of R femur, chronic B low back pain with radiating pain to buttock and posterior thighs and calves. Pt with multiple comorbidities, please refer to chart for full details  are also affecting patient's functional outcome.   REHAB POTENTIAL: Fair    CLINICAL DECISION MAKING: Evolving/moderate complexity  EVALUATION COMPLEXITY: High  PLAN:  PT FREQUENCY: 1-2x/week  PT DURATION: 8 weeks  PLANNED INTERVENTIONS: Therapeutic exercises, Therapeutic activity, Neuromuscular re-education, Balance training, Gait training, Patient/Family education, Self Care, Joint mobilization, Stair training, Vestibular training, Canalith repositioning, Visual/preceptual remediation/compensation, Orthotic/Fit training, DME instructions, Wheelchair mobility training, Spinal mobilization, Cryotherapy, Moist heat, Splintting, Taping, and Manual therapy  PLAN FOR NEXT SESSION: endurance, higher level balance in // bars   Temple Pacini PT, DPT  Physical Therapist - [place]  [facility]  08/31/2023, 11:59 AM

## 2023-08-31 NOTE — Progress Notes (Signed)
[facility] CONSULT NOTE  Patient Care Team: Beryle Flock Marzella Schlein, MD as PCP - General (Family Medicine) Carmina Miller, MD as Referring Physician (Radiation Oncology) Lockie Mola, MD as Referring Physician (Ophthalmology) Myrene Galas, PT (Inactive) as Physical Therapist (Physical Therapy) Earna Coder, MD as Consulting Physician (Hematology and Oncology) Delano Metz, MD as Consulting Physician (Pain Medicine)  CHIEF COMPLAINTS/PURPOSE OF CONSULTATION: Breast cancer  Oncology History Overview Note  # multi-focal stage IA right breast cancer s/p right lumpectomy on 04/27/2018.  Pathology of the right lateral lesion revealed a 2.5 cm grade II invasive ductal carcinoma with intermediate grade DCIS.  The medial lesion revealed a 1.1 cm grade II invasive ductal carcinoma with intermediate grade DCIS. LVIDS was present.  Margins were negative.  Zero of 2 lymph nodes were positive. Both tumors were ER + (100%), PR + (70%), and HER-2 negative.  Ki-67 was 10%.  Pathologic stage was mT2N0. Oncotype DX revealed a recurrence score of 12 which translated to a distant recurrence of 3% at 9 years with an AI or tamxifen alone.  The absolute benefit of chemotherpay was < 1%. S/p breast radiation from 06/30/2018 - 07/20/2018.    She began Femara on 09/06/2018.     # multilocular mucinous cystadenoma: s/p exploratory laparotomy, bilateral salpingo-oophrectomy, and pelvic washings on 02/10/2018. 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  Peritoneal washings revealed atypical cells.     Malignant neoplasm of upper-inner quadrant of right breast in female, estrogen receptor positive (HCC)  03/14/2018 Initial Diagnosis   Right breast mass noted on a CT scan, on mammogram 2 separate masses were noted.  2.2 cm mass at 11 o'clock position: IDC grade 2 with DCIS, ER 95%, PR 70%, Ki-67 10%,HER-2 negative ratio 1.3; second mass 1.3 cm at 1 o'clock  position: IDC grade 2, ER 100%, PR 70%, Ki-67 10%, HER-2 negative ratio 1.6, T2N0 stage Ib clinical stage AJCC 8   04/27/2018 Surgery   Right lumpectomy lateral: IDC grade 2, 2.5 cm, intermediate grade DCIS; right lumpectomy medial: IDC grade 2 1.1 cm, LVIDS present, intermediate grade DCIS, margins negative, 0/2 lymph nodes negative both tumors are ER PR positive HER-2 negative with a Ki-67 of 10%, T2N0 stage   05/06/2018 Cancer Staging   Staging form: Breast, AJCC 8th Edition - Pathologic: Stage IA (pT2(2), pN0, cM0, G2, ER+, PR+, HER2-) - Signed by Serena Croissant, MD on 05/06/2018     HISTORY OF PRESENTING ILLNESS: Patient ambulating in wheel chair. Accompanied by family.   Kendra [name] 77 y.o. female patient with stage I breast cancer ER/PR positive HER2 negative, Parkinson's disease chronic back pain, currently on Femara is here for follow-up.  Patient complains of ongoing bladder leakage/urgency x 6 month..  Denies any hot flashes.  Denies any falls.  Appetite is good.  No weight loss no nausea vomiting.  She is compliant with her Femara.  Review of Systems  Constitutional:  Positive for malaise/fatigue. Negative for chills, diaphoresis, fever and weight loss.  HENT:  Negative for nosebleeds and sore throat.   Eyes:  Negative for double vision.  Respiratory:  Negative for cough, hemoptysis, sputum production, shortness of breath and wheezing.   Cardiovascular:  Negative for chest pain, palpitations, orthopnea and leg swelling.  Gastrointestinal:  Negative for abdominal pain, blood in stool, constipation, diarrhea, heartburn, melena, nausea and vomiting.  Genitourinary:  Negative for dysuria, frequency and urgency.  Musculoskeletal:  Positive for back pain and joint pain.  Skin: Negative.  Negative for  itching and rash.  Neurological:  Negative for dizziness, tingling, focal weakness, weakness and headaches.  Endo/Heme/Allergies:  Does not bruise/bleed easily.  Psychiatric/Behavioral:   Negative for depression. The patient is not nervous/anxious and does not have insomnia.      MEDICAL HISTORY:  Past Medical History:  Diagnosis Date   Anxiety    Breast cancer (HCC) 03/2018   right breast cancer at 11:00 and 1:00   Bursitis    bilateral hips and knees   Complication of anesthesia    Depression    Family history of breast cancer 03/24/2018   Family history of lung cancer 03/24/2018   History of alcohol dependence (HCC) 2007   no ETOH; resolved since 2007   History of hiatal hernia    History of psychosis 2014   due to sleep disturbance   Hypertension    Parkinson disease (HCC)    Personal history of radiation therapy 06/2018-07/2018   right breast ca   PONV (postoperative nausea and vomiting)     SURGICAL HISTORY: Past Surgical History:  Procedure Laterality Date   BREAST BIOPSY Right 03/14/2018   11:00 DCIS and invasive ductal carcinoma   BREAST BIOPSY Right 03/14/2018   1:00 Invasive ductal carcinoma   BREAST LUMPECTOMY Right 04/27/2018   lumpectomy of 11 and 1:00 cancers, clear margins, negative LN   BREAST LUMPECTOMY WITH RADIOACTIVE SEED AND SENTINEL LYMPH NODE BIOPSY Right 04/27/2018   Procedure: RIGHT BREAST LUMPECTOMY WITH RADIOACTIVE SEED X 2 AND RIGHT SENTINEL LYMPH NODE BIOPSY;  Surgeon: Glenna Fellows, MD;  Location: [facility];  Service: General;  Laterality: Right;   CATARACT EXTRACTION W/PHACO Left 08/30/2019   Procedure: CATARACT EXTRACTION PHACO AND INTRAOCULAR LENS PLACEMENT (IOC) LEFT panoptix toric  01:20.5  12.7%  10.26;  Surgeon: Lockie Mola, MD;  Location: [facility];  Service: Ophthalmology;  Laterality: Left;   CATARACT EXTRACTION W/PHACO Right 09/20/2019   Procedure: CATARACT EXTRACTION PHACO AND INTRAOCULAR LENS PLACEMENT (IOC) RIGHT 5.71  00:36.4  15.7%;  Surgeon: Lockie Mola, MD;  Location: [facility];  Service: Ophthalmology;  Laterality: Right;   LAPAROTOMY N/A 02/10/2018    Procedure: EXPLORATORY LAPAROTOMY;  Surgeon: Shonna Chock, MD;  Location: [facility];  Service: Gynecology;  Laterality: N/A;   MASS EXCISION  01/2018   abdominal   OVARIAN CYST REMOVAL     SALPINGOOPHORECTOMY Bilateral 02/10/2018   Procedure: BILATERAL SALPINGO OOPHORECTOMY; PERITONEAL WASHINGS;  Surgeon: Shonna Chock, MD;  Location: [facility];  Service: Gynecology;  Laterality: Bilateral;   TONSILLECTOMY     TONSILLECTOMY AND ADENOIDECTOMY  1953    SOCIAL HISTORY: Social History   Socioeconomic History   Marital status: Married    Spouse name: robert   Number of children: 0   Years of education: Not on file   Highest education level: Some college, no degree  Occupational History   Occupation: retired Insurance underwriter of education  Tobacco Use   Smoking status: Never   Smokeless tobacco: Never  Vaping Use   Vaping status: Never Used  Substance and Sexual Activity   Alcohol use: Not Currently    Comment: alcohol dependence prior to 2007   Drug use: Never   Sexual activity: Yes    Partners: Male    Birth control/protection: Surgical  Other Topics Concern   Not on file  Social History Narrative   Not on file   Social Determinants of Health   Financial Resource Strain: Low Risk  (02/25/2023)  Overall Financial Resource Strain (CARDIA)    Difficulty of Paying Living Expenses: Not hard at all  Food Insecurity: No Food Insecurity (08/29/2020)   Hunger Vital Sign    Worried About Running Out of Food in the Last Year: Never true    Ran Out of Food in the Last Year: Never true  Transportation Needs: No Transportation Needs (02/25/2023)   PRAPARE - Administrator, Civil Service (Medical): No    Lack of Transportation (Non-Medical): No  Physical Activity: Inactive (08/29/2020)   Exercise Vital Sign    Days of Exercise per Week: 0 days    Minutes of Exercise per Session: 0 min  Stress: Stress Concern Present (08/29/2020)   [employer] of  Occupational Health - Occupational Stress Questionnaire    Feeling of Stress : To some extent  Social Connections: Moderately Integrated (08/29/2020)   Social Connection and Isolation Panel [NHANES]    Frequency of Communication with Friends and Family: More than three times a week    Frequency of Social Gatherings with Friends and Family: More than three times a week    Attends Religious Services: Never    Database administrator or Organizations: Yes    Attends Banker Meetings: 1 to 4 times per year    Marital Status: Married  Catering manager Violence: Not At Risk (08/29/2020)   Humiliation, Afraid, Rape, and Kick questionnaire    Fear of Current or Ex-Partner: No    Emotionally Abused: No    Physically Abused: No    Sexually Abused: No    FAMILY HISTORY: Family History  Problem Relation Age of Onset   Diabetes Mother    Breast cancer Mother 29   Hypertension Mother    Lung cancer Father        asbestos exposure   Heart disease Brother 38   Breast cancer Cousin 32       paternal cousin   Heart disease Paternal Grandmother 82   Heart disease Paternal Grandfather     ALLERGIES:  is allergic to hydroxyzine, other, and shrimp [shellfish allergy].  MEDICATIONS:  Current Outpatient Medications  Medication Sig Dispense Refill   Acetaminophen 500 MG capsule Take 1,000 mg by mouth 2 (two) times daily.     AMBULATORY NON FORMULARY MEDICATION 1 Bag by Other route daily. HERO smart dispenser 1 Bag 0   carbidopa-levodopa (SINEMET IR) 25-100 MG tablet Take 2 tablets by mouth with breakfast, with lunch, and with evening meal.     Carbidopa-Levodopa ER (SINEMET CR) 25-100 MG tablet controlled release Take 2 tablets by mouth at bedtime.     cyanocobalamin (VITAMIN B12) 1000 MCG/ML injection INJECT 1 ML INTO THE MUSCLE EVERY 30 DAYS 3 mL 3   denosumab (PROLIA) 60 MG/ML SOSY injection Inject 60 mg into the skin every 6 (six) months. takes annually     FLUoxetine HCl 60 MG  TABS TAKE 1 TABLET BY MOUTH DAILY 90 tablet 1   gabapentin (NEURONTIN) 100 MG capsule Take 200 mg by mouth 3 (three) times daily.     letrozole (FEMARA) 2.5 MG tablet TAKE 1 TABLET BY MOUTH DAILY 90 tablet 0   losartan-hydrochlorothiazide (HYZAAR) 50-12.5 MG tablet TAKE 1 TABLET BY MOUTH DAILY. STOP LISINOPRIL-HCTZ- THIS MEDICATION REPLACES THAT 90 tablet 0   mometasone (ELOCON) 0.1 % lotion Apply topically.     nystatin (MYCOSTATIN/NYSTOP) powder SMARTSIG:1 Application Topical 2-3 Times Daily     risperiDONE (RISPERDAL) 0.5 MG tablet TAKE 1 TABLET BY  MOUTH TWICE A DAY 180 tablet 1   traZODone (DESYREL) 100 MG tablet Take 1 tablet (100 mg total) by mouth at bedtime. 90 tablet 3   No current facility-administered medications for this visit.   PHYSICAL EXAMINATION: Vitals:   08/31/23 1311  BP: (!) 130/101  Pulse: 78  Resp: 19  Temp: 98.5 F (36.9 C)  SpO2: 97%   Filed Weights   08/31/23 1311  Weight: 218 lb 4.8 oz (99 kg)   Physical Exam Vitals reviewed.  Constitutional:      Appearance: She is not ill-appearing.     Comments: Accompanied by spouse  HENT:     Head: Normocephalic and atraumatic.     Mouth/Throat:     Pharynx: Oropharynx is clear.  Pulmonary:     Effort: No respiratory distress.  Abdominal:     General: There is no distension.     Tenderness: There is no guarding.  Musculoskeletal:        General: No deformity.     Comments: wheelchair  Skin:    General: Skin is warm.     Coloration: Skin is not pale.  Neurological:     Mental Status: She is alert and oriented to person, place, and time.     Comments: tremors  Psychiatric:        Mood and Affect: Mood normal.        Behavior: Behavior normal.    LABORATORY DATA:  I have reviewed the data as listed Lab Results  Component Value Date   WBC 8.0 08/31/2023   HGB 14.1 08/31/2023   HCT 42.6 08/31/2023   MCV 87.7 08/31/2023   PLT 157 08/31/2023   Recent Labs    11/06/22 1047 03/01/23 1429  08/31/23 1244  NA 138 131* 133*  K 4.0 3.6 3.9  CL 98 98 99  CO2 24 25 23   GLUCOSE 110* 157* 151*  BUN 9 10 10   CREATININE 0.75 0.85 0.70  CALCIUM 9.6 8.9 9.3  GFRNONAA  --  >60 >60  PROT 6.3 6.6 6.7  ALBUMIN 4.4 3.8 4.1  AST 17 30 25   ALT 26 12 14   ALKPHOS 37* 33* 32*  BILITOT 0.4 0.6 0.9    RADIOGRAPHIC STUDIES: I have personally reviewed the radiological images as listed and agreed with the findings in the report. No results found.   Assessment & Plan:   # Multi-focal stage IA right breast cancer s/p right lumpectomy on 04/27/2018.  ER + (100%), PR + (70%), and HER-2 negative.  Ki-67 was 10%.  Pathologic stage was mT2N0. Oncotype DX revealed a recurrence score of 12 which translated to a distant recurrence of 3% at 9 years with an AI or tamxifen alone.  No chemotherapy given.  S/p breast radiation from 06/30/2018 - 07/20/2018.  She began Femara on 09/06/2018.  She is tolerating it well. AUG 2024- Mammo BIL- WNL. Clinically stable and asymptomatic. Plan to complete 5 years of adjuvant AI in December 2024. Continue annual mammogram.     # B12 deficiency [hx of perncious anemia]- Patient's cousin gives her B12 injections monthly. Hmg 14.1. Monitor.             # Osteopenia- AUG 2023- T-score of -1.6- Continue Prolia SQ. Labs reviewed and acceptable for treatment. Proceed with prolia today.     # Parkinson/tremors/ gait instability- sinemet. Currently receiving PT. Massage has helped comfort.    # Bladder leakage/urgency-question atrophic vaginitis secondary to endocrine therapy - managed by pcp.    #  Elevated BG- PBF- 151 -discussed regarding dietary discretion- stable.   # Hypertension- 132/90. Follow up with PCP. Monitor pressure at home.   Prolia 03/01/23   # DISPOSITION: Prolia today 6 mo- lab (cbc, cmp, ca 27-29, ca 125), Dr Donneta Romberg, +/- prolia- la   No problem-specific Assessment & Plan notes found for this encounter.   All questions were answered. The  patient knows to call the clinic with any problems, questions or concerns.     Alinda Dooms, NP 08/31/2023 1:07 PM

## 2023-09-01 ENCOUNTER — Encounter: Payer: Self-pay | Admitting: *Deleted

## 2023-09-01 LAB — CA 125: Cancer Antigen (CA) 125: 8.5 U/mL (ref 0.0–38.1)

## 2023-09-01 LAB — CANCER ANTIGEN 27.29: CA 27.29: 23.2 U/mL (ref 0.0–38.6)

## 2023-09-01 NOTE — Progress Notes (Signed)
Kendra Masse, NP said [name] was interested in the free table massage offered for cancer patients.   Currently they are only offered at [facility].   I spoke to [name] and gave the information.  He said they currently have a massage therapist and [place] and will just continue to pay out of pocket to see them.

## 2023-09-07 ENCOUNTER — Ambulatory Visit: Payer: Medicare PPO | Attending: Family Medicine

## 2023-09-07 DIAGNOSIS — R278 Other lack of coordination: Secondary | ICD-10-CM | POA: Insufficient documentation

## 2023-09-07 DIAGNOSIS — R262 Difficulty in walking, not elsewhere classified: Secondary | ICD-10-CM | POA: Insufficient documentation

## 2023-09-07 DIAGNOSIS — M6281 Muscle weakness (generalized): Secondary | ICD-10-CM | POA: Insufficient documentation

## 2023-09-07 DIAGNOSIS — M25552 Pain in left hip: Secondary | ICD-10-CM | POA: Diagnosis not present

## 2023-09-07 DIAGNOSIS — R2681 Unsteadiness on feet: Secondary | ICD-10-CM | POA: Diagnosis not present

## 2023-09-07 NOTE — Therapy (Signed)
OUTPATIENT PHYSICAL THERAPY TREATMENT     Patient Name: Kendra [name] MRN: [id number] DOB:1946-07-01, 77 y.o., female Today's Date: 09/07/2023  PCP: Erasmo Downer, MD REFERRING PROVIDER: Erasmo Downer, MD  END OF SESSION:  PT End of Session - 09/07/23 1137     Visit Number 68    Number of Visits 74    Date for PT Re-Evaluation 10/05/23    Authorization Type [employer] PPO    Authorization Time Period 06/10/23-08/05/23    Progress Note Due on Visit 70    PT Start Time 1135    PT Stop Time 1215    PT Time Calculation (min) 40 min    Equipment Utilized During Treatment Gait belt    Activity Tolerance Patient tolerated treatment well;Patient limited by fatigue    Behavior During Therapy WFL for tasks assessed/performed                                 Past Medical History:  Diagnosis Date   Anxiety    Breast cancer (HCC) 03/2018   right breast cancer at 11:00 and 1:00   Bursitis    bilateral hips and knees   Complication of anesthesia    Depression    Family history of breast cancer 03/24/2018   Family history of lung cancer 03/24/2018   History of alcohol dependence (HCC) 2007   no ETOH; resolved since 2007   History of hiatal hernia    History of psychosis 2014   due to sleep disturbance   Hypertension    Parkinson disease (HCC)    Personal history of radiation therapy 06/2018-07/2018   right breast ca   PONV (postoperative nausea and vomiting)    Past Surgical History:  Procedure Laterality Date   BREAST BIOPSY Right 03/14/2018   11:00 DCIS and invasive ductal carcinoma   BREAST BIOPSY Right 03/14/2018   1:00 Invasive ductal carcinoma   BREAST LUMPECTOMY Right 04/27/2018   lumpectomy of 11 and 1:00 cancers, clear margins, negative LN   BREAST LUMPECTOMY WITH RADIOACTIVE SEED AND SENTINEL LYMPH NODE BIOPSY Right 04/27/2018   Procedure: RIGHT BREAST LUMPECTOMY WITH RADIOACTIVE SEED X 2 AND RIGHT SENTINEL LYMPH NODE  BIOPSY;  Surgeon: Glenna Fellows, MD;  Location: [facility];  Service: General;  Laterality: Right;   CATARACT EXTRACTION W/PHACO Left 08/30/2019   Procedure: CATARACT EXTRACTION PHACO AND INTRAOCULAR LENS PLACEMENT (IOC) LEFT panoptix toric  01:20.5  12.7%  10.26;  Surgeon: Lockie Mola, MD;  Location: [facility];  Service: Ophthalmology;  Laterality: Left;   CATARACT EXTRACTION W/PHACO Right 09/20/2019   Procedure: CATARACT EXTRACTION PHACO AND INTRAOCULAR LENS PLACEMENT (IOC) RIGHT 5.71  00:36.4  15.7%;  Surgeon: Lockie Mola, MD;  Location: [facility];  Service: Ophthalmology;  Laterality: Right;   LAPAROTOMY N/A 02/10/2018   Procedure: EXPLORATORY LAPAROTOMY;  Surgeon: Shonna Chock, MD;  Location: [facility];  Service: Gynecology;  Laterality: N/A;   MASS EXCISION  01/2018   abdominal   OVARIAN CYST REMOVAL     SALPINGOOPHORECTOMY Bilateral 02/10/2018   Procedure: BILATERAL SALPINGO OOPHORECTOMY; PERITONEAL WASHINGS;  Surgeon: Shonna Chock, MD;  Location: [facility];  Service: Gynecology;  Laterality: Bilateral;   TONSILLECTOMY     TONSILLECTOMY AND ADENOIDECTOMY  1953   Patient Active Problem List   Diagnosis Date Noted   Neurogenic bladder 05/14/2023   Prediabetes 12/15/2022   Chronic cough 08/17/2022   History  of allergy to shellfish 02/24/2022   Chronic low back pain of over 3 months duration 02/04/2022   Osteoarthritis of  AC (acromioclavicular) joint (Left) 02/04/2022   Osteoarthritis of hip (Right) 02/04/2022   Impaction fracture of distal end of radius and ulna, sequela (Left) 02/04/2022   Osteoarthritis of 1st (thumb) carpometacarpal (CMC) joint of hand (Left) 02/04/2022   Lumbosacral facet arthropathy (Multilevel) (Bilateral) (T12-S1) 02/04/2022   Lumbar central spinal stenosis w/o neurogenic claudication (L2-3, L3-4, L4-5) 02/04/2022   Lumbar foraminal stenosis (Bilateral: L2-3, L4-5) (Right: (Severe) L4-5) 02/04/2022    Lumbar lateral recess stenosis (Bilateral: L2-3, L3-4, and L4-5) (Severe) 02/04/2022   DDD (degenerative disc disease), lumbar 02/04/2022   Lumbosacral facet syndrome (Multilevel) (Bilateral) 02/04/2022   Osteoarthritis of lumbar spine 02/04/2022   Allergic conjunctivitis of both eyes 12/23/2021   Chronic hip pain (3ry area of Pain) (Bilateral) (L>R) 12/22/2021   Chronic lower extremity pain (2ry area of Pain) (Bilateral) (L>R) 12/22/2021   Abnormal MRI, lumbar spine (01/31/2020) 12/22/2021   Grade 1 Anterolisthesis of lumbar spine (L3/L4) & (5 mm) (L4/L5) 12/22/2021   Chronic pain syndrome 11/17/2021   High risk medication use 11/17/2021   Disorder of skeletal system 11/17/2021   Problems influencing health status 11/17/2021   Other hyperlipidemia 10/21/2021   Polypharmacy 08/12/2021   Chronic low back pain (1ry area of Pain) (Bilateral) (L>R) w/o sciatica 06/18/2021   Osteopenia of neck of femur (Right) 09/06/2020   Obesity 08/29/2020   Parkinson disease (HCC) 02/22/2020   Mucinous cystadenoma 12/24/2019   Bipolar disorder, in full remission, most recent episode depressed (HCC) 11/08/2019   Vitamin D insufficiency 08/24/2019   Insomnia due to mental condition 05/31/2019   Bipolar I disorder, most recent episode depressed (HCC) 05/31/2019   Alcohol use disorder, moderate, in sustained remission (HCC) 05/31/2019   Elevated tumor markers 05/27/2019   GAD (generalized anxiety disorder) 04/10/2019   MDD (major depressive disorder), severe (HCC) 01/31/2019   High serum carbohydrate antigen 19-9 (CA19-9) 12/01/2018   Adjustment disorder with anxious mood 11/24/2018   Essential hypertension 11/24/2018   B12 deficiency 10/17/2018   Incisional hernia, without obstruction or gangrene 09/12/2018   Genetic testing 04/07/2018   Family history of breast cancer 03/24/2018   Family history of lung cancer 03/24/2018   Malignant neoplasm of upper-outer quadrant of right breast in female, estrogen  receptor positive (HCC) 03/17/2018   Malignant neoplasm of upper-inner quadrant of right breast in female, estrogen receptor positive (HCC) 03/17/2018   History of psychosis 02/07/2018   History of insomnia 02/07/2018    ONSET DATE: Pt diagnosed with PD in 2019  REFERRING DIAG:  G20 (ICD-10-CM) - Parkinson's disease      THERAPY DIAG:  Muscle weakness (generalized)  Difficulty in walking, not elsewhere classified  Unsteadiness on feet  Other lack of coordination  Rationale for Evaluation and Treatment: Rehabilitation  SUBJECTIVE:   Pt reports feeling moderately energized at the moment. She reports no pain currently, no stumbles/falls. No other updates.  PERTINENT HISTORY:  Pt is a pleasant 77 yo female who was diagnosed with PD in 2019. She is known to clinic and [facility], and was last seen for LSVT BIG Sept-Oct 2022. Pt with chronic pain and has become increasingly sedentary as a result exacerbating mobility difficulties due to PD. Current concerns: Pt reports L and R hip pain and back pain. Pt was being seen by pain management clinic. Pt has had injections that provided relief at the time. Pt's spouse reports her physician  determined surgery was not an option for her pain so the plan is to manage it. Pt spouse thinks pain has resulted in reluctance for pt to move. Pt currently sedentary, doesn't walk much, but when she does she holds on to furniture for support due to balance problems. When pt first stands she must hold on to something due to mix of pain and/or dizziness (described as lightheadedness). Her pain can go from 3/10 to 9/10. It does improve once she starts moving. Pt has increased difficulty with multiple activities including: getting in and out of her bed, getting in and out of her car, freezing, decreased gait speed, decreased balance and cognition, and difficulty using BUE due to tremors.  Per chart other PMH includes hx of breast cancer, HTN, MCI, insomnia, cataracts,  bipolar 1, alcohol use disorder, GAD, MDD, osteopenia of neck of R femur, chronic B low back pain with radiating pain to buttock and posterior thighs and calves. Pt with multiple comorbidities, please refer to chart for full details    PAIN: from eval Are you having pain? Yes 7/10 Left hip   PRECAUTIONS: Fall  WEIGHT BEARING RESTRICTIONS: No  FALLS: Has patient fallen in last 6 months? Yes. Number of falls 1x, leg tangled in chair leg when getting up from a chair  PLOF: Independent with basic ADLs  PATIENT GOALS: improve mobility  OBJECTIVE   TODAY'S TREATMENT:                                                                                                                                         DATE: 09/07/23   Gait belt donned throughout for pt safety  TE: With 3# AW each LE: Gait for strength/endurance with 4WW 444 ft with one seated rest break Seated LAQ 12 x each LE Standing march 10x alt LE Standing hip ext 10x each LE STS 8x. Pt rates medium STS with overhead raise  2.5# bar 2x8 Gait for strength/endurance with 4WW 444 ft with one seated rest break Seated march 12x each LE Standing hip abd 10x each LE LTL stepping with UE support on bar 12x - discontinued due to hip discomfort, pt reports this improved with rest  Without AWs: Seated DF 2x20 bilat LE Seated PF 2x20 bilat LE STS 10x       PATIENT EDUCATION:  Education details: Pt educated throughout session about proper posture and technique with exercises. Improved exercise technique, movement at target joints, use of target muscles after min to mod verbal, visual, tactile cues.  Person educated: Patient and Spouse Education method: Explanation, Demonstration, Verbal cues, and Handouts Education comprehension: verbalized understanding, returned demonstration, verbal cues required, tactile cues required, and needs further education    HOME EXERCISE PROGRAM:   8//20/24: Access Code: [email or web address] URL:  [email or web address] Date: 06/22/2023 Prepared by: Temple Pacini  Exercises - Sit to Stand with Counter Support  - 1 x daily -  4-6 x weekly - 2 sets - 10 reps - Seated Long Arc Quad  - 1 x daily - 4-6 x weekly - 2 sets - 12 reps - Seated Heel Raise  - 1 x daily - 4-6 x weekly - 1 sets - 20 reps    GOALS: Goals reviewed with patient? Yes  SHORT TERM GOALS: Target date: 04/27/2023    Patient will be independent in home exercise program to improve strength/mobility for better functional independence with ADLs. Baseline: LSVT BIG protocol to be initiated next session; 12/18: not yet fully indep, reports she still gets confused about the technique; 12/03/2022: HEP to be updated; Pt reports practicing HEP 2x/week; 02/23/23: not currently performing HEP, reviewed in session and provided new copy; 03/16/2023: pt reports performing HEP every day; 03/30/23: Pt reports she is not being consistent with her HEP; 7/1: pt reports she is not doing much at home; 06/10/23: pt unsure how often she is exercising at home; 10/8: pt repots unsure about her HEP Goal status: MET (previously MET)   LONG TERM GOALS: Target date: 06/08/2023     Patient will increase FOTO score to equal to or greater than 49 to demonstrate statistically significant improvement in mobility and quality of life.  Baseline: 40; 12/18: deferred due to time; 45; 3/19: deferred to next visit; 01/21/2023; 02/23/2023: 46 unchanged from previous assessment); 03/17/2023: 48; 06/10/23: 52 Goal status: MET  2.  Patient (> 61 years old) will complete five times sit to stand test in < 15 seconds indicating decreased fall risk.  Baseline: 27 sec; 12/18 23 sec hands-free; 11/04/22: 24 sec hands free; 2/1/124: 22.7 sec use of BUE, 21.3 sec hands-free; 01/19/23: 18 seconds hands-free; 02/23/23: 19 seconds hands-free; 03/16/2023: 18 seconds hands-free; 03/30/23: 17.3 seconds hands-free; 05/03/23: 17.3 seconds hands-free; 06/10/23: 19 seconds hands-free; 10/8:  16 seconds hands-free  Goal status: PARTIALLY MET  3.  Patient will increase 10 meter walk test to >1.69m/s with WFL B step-length, upright posture for >90% of test to improve ease with gait and to override hypokentic and bradykinetic movement.  Baseline: 0.58 m/s with 2NF; 12/18: 0.7 m/s with 6OZ; 11/04/22: 0.69 m/s with 3YQ;  12/03/22: 0.73 m/s with 6VH; 01/19/2023: 0.77 m/s with 8IO; 02/23/2023: 0.83 m/s with 9GE; 03/16/2023: 0.83 m/s with 9BM , pt still somewhat crouched, requires cues for upright posture >50% of time; 03/30/23: 0.83 m/s with crouched posture; 05/03/23 0.8 m/s with RW, some improvement with upright posture; 06/10/23:  0.77 m/s with 4WW and crouched posture; 10/8: 0.74 m/s 4WW Goal status: IN PROGRESS  4.  Patient will reduce time to don jacket by at least 5 seconds in order to increase ease with dressing and override bradykinetic movement Baseline: 17 seconds; 12/18: 12 seconds Goal status: MET  5.  Pt will demo or report ability to open standard jar using only 1-2 attempts in order to exhibit improved functional mobility for ADLs.  Baseline: 5.7 seconds with multiple attempts to turn jar lid to open; 12/18: 2.8 sec opens in 2 turns Goal status: MET  6.  Patient will increase Berg Balance score by > 6 points to demonstrate decreased fall risk during functional activities. Baseline: to be tested next 1-2 visits; 12/03/2022: 44/56; 01/21/23 45/56; 48/56; 03/17/2023: 48/56; 05/03/23: 50/56; 8/8: 48/56; 08/10/2023: deferred  Goal status: Partially met   7.  Patient will report at least 4 days in a row with minimal to no L hip pain in order to improve QOL and ease/safety with functional mobility. Baseline:  Pt reports pain majority of days in L hip; 12/03/2022: pt reports having hip pain about every day; 3/19: rates hip pain daily (being seen now by MD, new imaging); 02/23/2023: pt went for period of time without hip pain (she thinks a week), but reports it has now come back,; 5/28: pt reports hip pain  "every other day"; 05/03/23: pt reports pain in hip every day in past week Goal status: NOT MET/discontinued   8. Patient will increase six minute walk test distance to >1000 for progression to community ambulator and improve gait ability Baseline:02/23/2023: 722 ft with 4WW and two standing rest breaks, rates test as hard; 03/16/2023: deferred to next visit due to time; 5/28/20204: 641 ft with 4WW, pt requests to end test early due to fatigue at 5 min 35 sec; 05/03/23: 645 ft with 4WW and able to complete full test; 06/10/23: 708 ft; 08/10/23: 632 ft with 3 rest breaks  Goal status: IN PROGRESS  ASSESSMENT:  Pt with improved energy levels upon presenting to PT today. She was able to advance to preforming more standing strengthening interventions. She was still somewhat limited with standing therex by L hip pain. Exercise that increased pain was discontinued and pt reported improvement in pain with seated rest. The pt will benefit from further skilled PT to improve strength, balance, gait and mobility for improved quality of life and decreased risk of falling.   OBJECTIVE IMPAIRMENTS: Abnormal gait, decreased balance, decreased cognition, decreased coordination, decreased endurance, decreased knowledge of use of DME, decreased mobility, difficulty walking, decreased strength, dizziness, hypomobility, impaired UE functional use, improper body mechanics, postural dysfunction, and pain.   ACTIVITY LIMITATIONS: lifting, bending, standing, squatting, stairs, transfers, bed mobility, bathing, dressing, hygiene/grooming, and locomotion level  PARTICIPATION LIMITATIONS: meal prep, cleaning, laundry, medication management, personal finances, driving, shopping, community activity, and yard work  PERSONAL FACTORS: Age, Fitness, Sex, Time since onset of injury/illness/exacerbation, and 3+ comorbidities: PMH includes hx of breast cancer, HTN, MCI, insomnia, cataracts, bipolar 1, alcohol use disorder, GAD, MDD, osteopenia  of neck of R femur, chronic B low back pain with radiating pain to buttock and posterior thighs and calves. Pt with multiple comorbidities, please refer to chart for full details  are also affecting patient's functional outcome.   REHAB POTENTIAL: Fair    CLINICAL DECISION MAKING: Evolving/moderate complexity  EVALUATION COMPLEXITY: High  PLAN:  PT FREQUENCY: 1-2x/week  PT DURATION: 8 weeks  PLANNED INTERVENTIONS: Therapeutic exercises, Therapeutic activity, Neuromuscular re-education, Balance training, Gait training, Patient/Family education, Self Care, Joint mobilization, Stair training, Vestibular training, Canalith repositioning, Visual/preceptual remediation/compensation, Orthotic/Fit training, DME instructions, Wheelchair mobility training, Spinal mobilization, Cryotherapy, Moist heat, Splintting, Taping, and Manual therapy  PLAN FOR NEXT SESSION: endurance, higher level balance in // bars   Temple Pacini PT, DPT  Physical Therapist - [facility]  [facility]  09/07/2023, 12:17 PM

## 2023-09-07 NOTE — Addendum Note (Signed)
Addended by: Baird Kay on: 09/07/2023 11:21 AM   Modules accepted: Orders

## 2023-09-09 ENCOUNTER — Encounter: Payer: Self-pay | Admitting: Psychiatry

## 2023-09-09 ENCOUNTER — Telehealth: Payer: Medicare PPO | Admitting: Psychiatry

## 2023-09-09 DIAGNOSIS — F5105 Insomnia due to other mental disorder: Secondary | ICD-10-CM | POA: Diagnosis not present

## 2023-09-09 DIAGNOSIS — F3176 Bipolar disorder, in full remission, most recent episode depressed: Secondary | ICD-10-CM | POA: Diagnosis not present

## 2023-09-09 DIAGNOSIS — R0683 Snoring: Secondary | ICD-10-CM | POA: Insufficient documentation

## 2023-09-09 DIAGNOSIS — G2401 Drug induced subacute dyskinesia: Secondary | ICD-10-CM | POA: Diagnosis not present

## 2023-09-09 DIAGNOSIS — F411 Generalized anxiety disorder: Secondary | ICD-10-CM | POA: Diagnosis not present

## 2023-09-09 NOTE — Progress Notes (Signed)
Virtual Visit via Video Note  I connected with Kendra [name] on 09/09/23 at  3:00 PM EST by a video enabled telemedicine application and verified that I am speaking with the correct person using two identifiers.  Location Provider Location : [facility] Patient Location : Home  Participants: Patient ,Spouse, Provider    I discussed the limitations of evaluation and management by telemedicine and the availability of in person appointments. The patient expressed understanding and agreed to proceed.   I discussed the assessment and treatment plan with the patient. The patient was provided an opportunity to ask questions and all were answered. The patient agreed with the plan and demonstrated an understanding of the instructions.   The patient was advised to call back or seek an in-person evaluation if the symptoms worsen or if the condition fails to improve as anticipated.   BH MD OP Progress Note  09/09/2023 3:24 PM Kendra [name]  MRN:  [id number]  Chief Complaint:  Chief Complaint  Patient presents with   Follow-up   Depression   Anxiety   Medication Refill   HPI: Kendra [name] is a 77 year old Caucasian female, married, retired, lives in [place], has a history of bipolar disorder, GAD, insomnia, hyperprolactinemia, multifocal stage II breast cancer status postlumpectomy, ovarian cyst removal, Parkinson's disease, bilateral hearing loss was evaluated by telemedicine today.  Patient as well as spouse- Kendra [name] participated in the evaluation today.  Collateral information obtained from her spouse.  Patient today appeared to be alert, oriented, was able to answer questions appropriately.  Patient reports overall she is doing well.  She reports sleep is overall good.  She does have some snoring.  She does tend to stay tired during the day.  Does not do a lot of activities.  According to spouse patient is currently being weaned off of her antiparkinsonian medications.  She is currently  under the care of neurology.  I have reviewed notes by Dr. Sherryll Burger dated 08/11/2023-' low suspicion for primary parkinsonian symptoms negative syn one biopsy."  Patient continues to be in physical therapy.  Patient reports she is currently compliant on her medications.  She does have abnormal mouth movements which was observed in session today.  She however reports it does not bother her.  Denies any other side effects.  Patient denies any suicidality, homicidality or perceptual disturbances.  Patient denies any other concerns today.  Visit Diagnosis:    ICD-10-CM   1. Bipolar disorder, in full remission, most recent episode depressed (HCC)  F31.76     2. GAD (generalized anxiety disorder)  F41.1     3. Insomnia due to mental condition  F51.05    mood,pain    4. Tardive dyskinesia  G24.01     5. Snoring  R06.83 Ambulatory referral to Pulmonology      Past Psychiatric History: I have reviewed past psychiatric history from progress note on 03/01/2019, past trials of risperidone, Prozac, Seroquel, Ativan.    Active Ambulatory Problems    Diagnosis Date Noted   History of psychosis 02/07/2018   History of insomnia 02/07/2018   Malignant neoplasm of upper-outer quadrant of right breast in female, estrogen receptor positive (HCC) 03/17/2018   Malignant neoplasm of upper-inner quadrant of right breast in female, estrogen receptor positive (HCC) 03/17/2018   Family history of breast cancer 03/24/2018   Family history of lung cancer 03/24/2018   Genetic testing 04/07/2018   Incisional hernia, without obstruction or gangrene 09/12/2018   B12 deficiency 10/17/2018   Adjustment disorder with  anxious mood 11/24/2018   Essential hypertension 11/24/2018   High serum carbohydrate antigen 19-9 (CA19-9) 12/01/2018   MDD (major depressive disorder), severe (HCC) 01/31/2019   GAD (generalized anxiety disorder) 04/10/2019   Elevated tumor markers 05/27/2019   Insomnia due to mental condition  05/31/2019   Bipolar I disorder, most recent episode depressed (HCC) 05/31/2019   Alcohol use disorder, moderate, in sustained remission (HCC) 05/31/2019   Vitamin D insufficiency 08/24/2019   Bipolar disorder, in full remission, most recent episode depressed (HCC) 11/08/2019   Mucinous cystadenoma 12/24/2019   Parkinson disease (HCC) 02/22/2020   Obesity 08/29/2020   Osteopenia of neck of femur (Right) 09/06/2020   Chronic low back pain (1ry area of Pain) (Bilateral) (L>R) w/o sciatica 06/18/2021   Polypharmacy 08/12/2021   Other hyperlipidemia 10/21/2021   Chronic pain syndrome 11/17/2021   High risk medication use 11/17/2021   Disorder of skeletal system 11/17/2021   Problems influencing health status 11/17/2021   Chronic hip pain (3ry area of Pain) (Bilateral) (L>R) 12/22/2021   Chronic lower extremity pain (2ry area of Pain) (Bilateral) (L>R) 12/22/2021   Abnormal MRI, lumbar spine (01/31/2020) 12/22/2021   Grade 1 Anterolisthesis of lumbar spine (L3/L4) & (5 mm) (L4/L5) 12/22/2021   Allergic conjunctivitis of both eyes 12/23/2021   Chronic low back pain of over 3 months duration 02/04/2022   Osteoarthritis of  AC (acromioclavicular) joint (Left) 02/04/2022   Osteoarthritis of hip (Right) 02/04/2022   Impaction fracture of distal end of radius and ulna, sequela (Left) 02/04/2022   Osteoarthritis of 1st (thumb) carpometacarpal (CMC) joint of hand (Left) 02/04/2022   Lumbosacral facet arthropathy (Multilevel) (Bilateral) (T12-S1) 02/04/2022   Lumbar central spinal stenosis w/o neurogenic claudication (L2-3, L3-4, L4-5) 02/04/2022   Lumbar foraminal stenosis (Bilateral: L2-3, L4-5) (Right: (Severe) L4-5) 02/04/2022   Lumbar lateral recess stenosis (Bilateral: L2-3, L3-4, and L4-5) (Severe) 02/04/2022   DDD (degenerative disc disease), lumbar 02/04/2022   Lumbosacral facet syndrome (Multilevel) (Bilateral) 02/04/2022   Osteoarthritis of lumbar spine 02/04/2022   History of allergy  to shellfish 02/24/2022   Chronic cough 08/17/2022   Prediabetes 12/15/2022   Neurogenic bladder 05/14/2023   Tardive dyskinesia 09/09/2023   Snoring 09/09/2023   Resolved Ambulatory Problems    Diagnosis Date Noted   Pelvic mass in female 02/07/2018   Breast mass, right 02/07/2018   History of alcohol dependence (HCC) 02/07/2018   Moderate recurrent major depression (HCC) 07/25/2018   Elevated LFTs 10/17/2018   Screening for lipid disorders 11/24/2018   Altered mental status 04/10/2019   Cognitive disorder 05/31/2019   Candidal skin infection 04/29/2020   Elevated blood pressure reading 05/23/2021   Erythema ab igne 12/23/2021   Blepharitis of upper and lower eyelids of both eyes 08/17/2022   Failure to attend appointment 12/17/2022   Past Medical History:  Diagnosis Date   Anxiety    Breast cancer (HCC) 03/2018   Bursitis    Complication of anesthesia    Depression    History of hiatal hernia    Hypertension    Personal history of radiation therapy 06/2018-07/2018   PONV (postoperative nausea and vomiting)      Past Medical History:  Past Medical History:  Diagnosis Date   Anxiety    Breast cancer (HCC) 03/2018   right breast cancer at 11:00 and 1:00   Bursitis    bilateral hips and knees   Complication of anesthesia    Depression    Family history of breast cancer 03/24/2018  Family history of lung cancer 03/24/2018   History of alcohol dependence (HCC) 2007   no ETOH; resolved since 2007   History of hiatal hernia    History of psychosis 2014   due to sleep disturbance   Hypertension    Parkinson disease (HCC)    Personal history of radiation therapy 06/2018-07/2018   right breast ca   PONV (postoperative nausea and vomiting)     Past Surgical History:  Procedure Laterality Date   BREAST BIOPSY Right 03/14/2018   11:00 DCIS and invasive ductal carcinoma   BREAST BIOPSY Right 03/14/2018   1:00 Invasive ductal carcinoma   BREAST LUMPECTOMY Right  04/27/2018   lumpectomy of 11 and 1:00 cancers, clear margins, negative LN   BREAST LUMPECTOMY WITH RADIOACTIVE SEED AND SENTINEL LYMPH NODE BIOPSY Right 04/27/2018   Procedure: RIGHT BREAST LUMPECTOMY WITH RADIOACTIVE SEED X 2 AND RIGHT SENTINEL LYMPH NODE BIOPSY;  Surgeon: Glenna Fellows, MD;  Location: [facility];  Service: General;  Laterality: Right;   CATARACT EXTRACTION W/PHACO Left 08/30/2019   Procedure: CATARACT EXTRACTION PHACO AND INTRAOCULAR LENS PLACEMENT (IOC) LEFT panoptix toric  01:20.5  12.7%  10.26;  Surgeon: Lockie Mola, MD;  Location: [facility];  Service: Ophthalmology;  Laterality: Left;   CATARACT EXTRACTION W/PHACO Right 09/20/2019   Procedure: CATARACT EXTRACTION PHACO AND INTRAOCULAR LENS PLACEMENT (IOC) RIGHT 5.71  00:36.4  15.7%;  Surgeon: Lockie Mola, MD;  Location: [facility];  Service: Ophthalmology;  Laterality: Right;   LAPAROTOMY N/A 02/10/2018   Procedure: EXPLORATORY LAPAROTOMY;  Surgeon: Shonna Chock, MD;  Location: [facility];  Service: Gynecology;  Laterality: N/A;   MASS EXCISION  01/2018   abdominal   OVARIAN CYST REMOVAL     SALPINGOOPHORECTOMY Bilateral 02/10/2018   Procedure: BILATERAL SALPINGO OOPHORECTOMY; PERITONEAL WASHINGS;  Surgeon: Shonna Chock, MD;  Location: [facility];  Service: Gynecology;  Laterality: Bilateral;   TONSILLECTOMY     TONSILLECTOMY AND ADENOIDECTOMY  1953    Family Psychiatric History: I have reviewed family psychiatric history from progress note on 03/01/2019.  Family History:  Family History  Problem Relation Age of Onset   Diabetes Mother    Breast cancer Mother 63   Hypertension Mother    Lung cancer Father        asbestos exposure   Heart disease Brother 33   Breast cancer Cousin 5       paternal cousin   Heart disease Paternal Grandmother 35   Heart disease Paternal Grandfather     Social History: I have reviewed social history from progress note on  03/01/2019. Social History   Socioeconomic History   Marital status: Married    Spouse name: [name]   Number of children: 0   Years of education: Not on file   Highest education level: Some college, no degree  Occupational History   Occupation: retired Insurance underwriter of education  Tobacco Use   Smoking status: Never   Smokeless tobacco: Never  Vaping Use   Vaping status: Never Used  Substance and Sexual Activity   Alcohol use: Not Currently    Comment: alcohol dependence prior to 2007   Drug use: Never   Sexual activity: Yes    Partners: Male    Birth control/protection: Surgical  Other Topics Concern   Not on file  Social History Narrative   Not on file   Social Determinants of Health   Financial Resource Strain: Low Risk  (02/25/2023)  Overall Financial Resource Strain (CARDIA)    Difficulty of Paying Living Expenses: Not hard at all  Food Insecurity: No Food Insecurity (08/29/2020)   Hunger Vital Sign    Worried About Running Out of Food in the Last Year: Never true    Ran Out of Food in the Last Year: Never true  Transportation Needs: No Transportation Needs (02/25/2023)   PRAPARE - Administrator, Civil Service (Medical): No    Lack of Transportation (Non-Medical): No  Physical Activity: Inactive (08/29/2020)   Exercise Vital Sign    Days of Exercise per Week: 0 days    Minutes of Exercise per Session: 0 min  Stress: Stress Concern Present (08/29/2020)   [employer] of Occupational Health - Occupational Stress Questionnaire    Feeling of Stress : To some extent  Social Connections: Moderately Integrated (08/29/2020)   Social Connection and Isolation Panel [NHANES]    Frequency of Communication with Friends and Family: More than three times a week    Frequency of Social Gatherings with Friends and Family: More than three times a week    Attends Religious Services: Never    Database administrator or Organizations: Yes    Attends  Banker Meetings: 1 to 4 times per year    Marital Status: Married    Allergies:  Allergies  Allergen Reactions   Hydroxyzine Anaphylaxis    Tongue swollen   Other Other (See Comments)    Food poisoning    Shrimp [Shellfish Allergy] Other (See Comments)    Food poisoning     Metabolic Disorder Labs: Lab Results  Component Value Date   HGBA1C 6.0 (H) 11/06/2022   Lab Results  Component Value Date   PROLACTIN 78.3 (H) 01/04/2023   PROLACTIN 70.5 (H) 05/12/2022   Lab Results  Component Value Date   CHOL 191 11/06/2022   TRIG 112 11/06/2022   HDL 58 11/06/2022   CHOLHDL 3.3 12/17/2021   LDLCALC 113 (H) 11/06/2022   LDLCALC 113 (H) 12/17/2021   Lab Results  Component Value Date   TSH 1.653 01/04/2023   TSH 2.31 04/13/2019    Therapeutic Level Labs: No results found for: "LITHIUM" No results found for: "VALPROATE" No results found for: "CBMZ"  Current Medications: Current Outpatient Medications  Medication Sig Dispense Refill   Acetaminophen 500 MG capsule Take 1,000 mg by mouth 2 (two) times daily.     AMBULATORY NON FORMULARY MEDICATION 1 Bag by Other route daily. HERO smart dispenser 1 Bag 0   carbidopa-levodopa (SINEMET IR) 25-100 MG tablet Take 2 tablets by mouth with breakfast, with lunch, and with evening meal. Start weaning     Carbidopa-Levodopa ER (SINEMET CR) 25-100 MG tablet controlled release Take 2 tablets by mouth at bedtime. Weaning off     cyanocobalamin (VITAMIN B12) 1000 MCG/ML injection INJECT 1 ML INTO THE MUSCLE EVERY 30 DAYS 3 mL 3   denosumab (PROLIA) 60 MG/ML SOSY injection Inject 60 mg into the skin every 6 (six) months. takes annually     FLUoxetine HCl 60 MG TABS TAKE 1 TABLET BY MOUTH DAILY 90 tablet 1   gabapentin (NEURONTIN) 100 MG capsule Take 200 mg by mouth 3 (three) times daily.     letrozole (FEMARA) 2.5 MG tablet Take 1 tablet (2.5 mg total) by mouth daily.     losartan-hydrochlorothiazide (HYZAAR) 50-12.5 MG tablet  TAKE 1 TABLET BY MOUTH DAILY. STOP LISINOPRIL-HCTZ- THIS MEDICATION REPLACES THAT 90 tablet 0  mometasone (ELOCON) 0.1 % lotion Apply topically.     nystatin (MYCOSTATIN/NYSTOP) powder SMARTSIG:1 Application Topical 2-3 Times Daily     risperiDONE (RISPERDAL) 0.5 MG tablet TAKE 1 TABLET BY MOUTH TWICE A DAY 180 tablet 1   traZODone (DESYREL) 100 MG tablet Take 1 tablet (100 mg total) by mouth at bedtime. 90 tablet 3   No current facility-administered medications for this visit.     Musculoskeletal: Strength & Muscle Tone:  UTA Gait & Station:  seated Patient leans: N/A  Psychiatric Specialty Exam: Review of Systems  Constitutional:  Positive for fatigue.  Psychiatric/Behavioral:  Positive for decreased concentration.     There were no vitals taken for this visit.There is no height or weight on file to calculate BMI.  General Appearance: Fairly Groomed  Eye Contact:  Fair  Speech:  Clear and Coherent  Volume:  Decreased  Mood:  Euthymic  Affect:  Appropriate  Thought Process:  Goal Directed and Descriptions of Associations: Intact  Orientation:  Other:  Person, situation  Thought Content: Logical   Suicidal Thoughts:  No  Homicidal Thoughts:  No  Memory:  Immediate;   Fair  Judgement:  Fair  Insight:  Fair  Psychomotor Activity:  Normal  Concentration:  Concentration: Fair and Attention Span: Fair  Recall:  [employer] of Knowledge: Fair  Language: Fair  Akathisia:  No  Handed:  Right  AIMS (if indicated): Patient with abnormal involuntary movements around the mouth, denies any other concerns.  Does have tremors although it has improved  Assets:  Desire for Improvement Housing Intimacy Social Support  ADL's:  Intact  Cognition: WNL  Sleep:  Fair   Screenings: Midwife Visit from 08/10/2023 in [facility] Office Visit from 04/08/2023 in [facility] Office Visit from  09/04/2022 in [facility] Office Visit from 06/04/2022 in [facility] Office Visit from 03/02/2022 in [facility]  AIMS Total Score 0 0 0 0 0      GAD-7    Flowsheet Row Office Visit from 08/10/2023 in [facility] Counselor from 03/25/2021 in [facility] Counselor 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 [facility]  Total GAD-7 Score 9 4 7       PHQ2-9    Flowsheet Row Office Visit from 08/10/2023 in [facility] Office Visit from 02/09/2023 in [facility] Office Visit from 01/26/2023 in [facility] Procedure visit from 01/12/2023 in [facility] Visit from 01/04/2023 in [facility]  PHQ-2 Total Score 2 1 0 0 1  PHQ-9 Total Score 17 13 -- -- 8      Flowsheet Row Video Visit from 09/09/2023 in [facility] Office Visit from 08/10/2023 in [facility] Office Visit from 04/08/2023 in [facility]  C-SSRS RISK CATEGORY No Risk No Risk No Risk        Assessment and Plan: Jaisley Synnott is a 77 year old Caucasian female, married, lives in [place], has a history of bipolar disorder, GAD, Parkinson's disease was evaluated by telemedicine today.  Patient currently continues to struggle with cognitive issues, memory problems-lack of motivation/fatigue during the day, although currently  denies any depression or anxiety symptoms, will benefit from referral to sleep study to rule out sleep apnea, and will continue to follow up with neurology  for further management.  Plan as noted below.  Plan Bipolar disorder type I in remission Risperidone 0.5 mg p.o. daily in the morning and 0.5 mg at bedtime Prozac 60 mg p.o. daily  GAD-stable Prozac 60 mg p.o. daily   Insomnia/snoring-unstable Patient does report fatigue/snoring/excessive sleepiness. Although sleep at night is okay. Trazodone 100 mg p.o. nightly Continue sleep hygiene techniques Will refer for sleep study-Ambulatory referral to pulmonology   Patient with recent syn one test that came back negative-I have reviewed notes by Dr.Shah-neurology-patient currently being weaned off of antiparkinsonian medication/Sinemet.  Patient to continue to follow up with neurology.  Collateral information obtained from spouse as noted above.  Collaboration of Care: Collaboration of Care: Other patient encouraged to follow up with pulmonology as noted.  Patient/Guardian was advised Release of Information must be obtained prior to any record release in order to collaborate their care with an outside provider. Patient/Guardian was advised if they have not already done so to contact the registration department to sign all necessary forms in order for [place] to release information regarding their care.   Consent: Patient/Guardian gives verbal consent for treatment and assignment of benefits for services provided during this visit. Patient/Guardian expressed understanding and agreed to proceed.   Follow-up in clinic in 3 months or sooner if needed.  This note was generated in part or whole with voice recognition software. Voice recognition is usually quite accurate but there are transcription errors that can and very often do occur. I apologize for any typographical errors that were not detected and corrected.    Jomarie Longs, MD 09/10/2023, 1:33 PM

## 2023-09-14 ENCOUNTER — Ambulatory Visit: Payer: Medicare PPO

## 2023-09-14 DIAGNOSIS — M6281 Muscle weakness (generalized): Secondary | ICD-10-CM | POA: Diagnosis not present

## 2023-09-14 DIAGNOSIS — R278 Other lack of coordination: Secondary | ICD-10-CM | POA: Diagnosis not present

## 2023-09-14 DIAGNOSIS — R262 Difficulty in walking, not elsewhere classified: Secondary | ICD-10-CM

## 2023-09-14 DIAGNOSIS — R2681 Unsteadiness on feet: Secondary | ICD-10-CM | POA: Diagnosis not present

## 2023-09-14 DIAGNOSIS — M25552 Pain in left hip: Secondary | ICD-10-CM | POA: Diagnosis not present

## 2023-09-14 NOTE — Therapy (Signed)
OUTPATIENT PHYSICAL THERAPY TREATMENT     Patient Name: Kendra [name] MRN: [id number] DOB:[birth year], 77 y.o., female Today's Date: 09/14/2023  PCP: Erasmo Downer, MD REFERRING PROVIDER: Erasmo Downer, MD  END OF SESSION:  PT End of Session - 09/14/23 1154     Visit Number 69    Number of Visits 74    Date for PT Re-Evaluation 10/05/23    Authorization Type [employer] PPO    Authorization Time Period 06/10/23-08/05/23    Authorization - Visit Number 9    Progress Note Due on Visit 70    PT Start Time 1153    PT Stop Time 1229    PT Time Calculation (min) 36 min    Equipment Utilized During Treatment Gait belt    Activity Tolerance Patient tolerated treatment well;Patient limited by fatigue    Behavior During Therapy WFL for tasks assessed/performed                                  Past Medical History:  Diagnosis Date   Anxiety    Breast cancer (HCC) 03/2018   right breast cancer at 11:00 and 1:00   Bursitis    bilateral hips and knees   Complication of anesthesia    Depression    Family history of breast cancer 03/24/2018   Family history of lung cancer 03/24/2018   History of alcohol dependence (HCC) 2007   no ETOH; resolved since 2007   History of hiatal hernia    History of psychosis 2014   due to sleep disturbance   Hypertension    Parkinson disease (HCC)    Personal history of radiation therapy 06/2018-07/2018   right breast ca   PONV (postoperative nausea and vomiting)    Past Surgical History:  Procedure Laterality Date   BREAST BIOPSY Right 03/14/2018   11:00 DCIS and invasive ductal carcinoma   BREAST BIOPSY Right 03/14/2018   1:00 Invasive ductal carcinoma   BREAST LUMPECTOMY Right 04/27/2018   lumpectomy of 11 and 1:00 cancers, clear margins, negative LN   BREAST LUMPECTOMY WITH RADIOACTIVE SEED AND SENTINEL LYMPH NODE BIOPSY Right 04/27/2018   Procedure: RIGHT BREAST LUMPECTOMY WITH RADIOACTIVE  SEED X 2 AND RIGHT SENTINEL LYMPH NODE BIOPSY;  Surgeon: Glenna Fellows, MD;  Location: [facility];  Service: General;  Laterality: Right;   CATARACT EXTRACTION W/PHACO Left 08/30/2019   Procedure: CATARACT EXTRACTION PHACO AND INTRAOCULAR LENS PLACEMENT (IOC) LEFT panoptix toric  01:20.5  12.7%  10.26;  Surgeon: Lockie Mola, MD;  Location: [facility];  Service: Ophthalmology;  Laterality: Left;   CATARACT EXTRACTION W/PHACO Right 09/20/2019   Procedure: CATARACT EXTRACTION PHACO AND INTRAOCULAR LENS PLACEMENT (IOC) RIGHT 5.71  00:36.4  15.7%;  Surgeon: Lockie Mola, MD;  Location: [facility];  Service: Ophthalmology;  Laterality: Right;   LAPAROTOMY N/A 02/10/2018   Procedure: EXPLORATORY LAPAROTOMY;  Surgeon: Shonna Chock, MD;  Location: [facility];  Service: Gynecology;  Laterality: N/A;   MASS EXCISION  01/2018   abdominal   OVARIAN CYST REMOVAL     SALPINGOOPHORECTOMY Bilateral 02/10/2018   Procedure: BILATERAL SALPINGO OOPHORECTOMY; PERITONEAL WASHINGS;  Surgeon: Shonna Chock, MD;  Location: [facility];  Service: Gynecology;  Laterality: Bilateral;   TONSILLECTOMY     TONSILLECTOMY AND ADENOIDECTOMY  1953   Patient Active Problem List   Diagnosis Date Noted   Tardive dyskinesia 09/09/2023   Snoring  09/09/2023   Neurogenic bladder 05/14/2023   Prediabetes 12/15/2022   Chronic cough 08/17/2022   History of allergy to shellfish 02/24/2022   Chronic low back pain of over 3 months duration 02/04/2022   Osteoarthritis of  AC (acromioclavicular) joint (Left) 02/04/2022   Osteoarthritis of hip (Right) 02/04/2022   Impaction fracture of distal end of radius and ulna, sequela (Left) 02/04/2022   Osteoarthritis of 1st (thumb) carpometacarpal (CMC) joint of hand (Left) 02/04/2022   Lumbosacral facet arthropathy (Multilevel) (Bilateral) (T12-S1) 02/04/2022   Lumbar central spinal stenosis w/o neurogenic claudication (L2-3, L3-4, L4-5)  02/04/2022   Lumbar foraminal stenosis (Bilateral: L2-3, L4-5) (Right: (Severe) L4-5) 02/04/2022   Lumbar lateral recess stenosis (Bilateral: L2-3, L3-4, and L4-5) (Severe) 02/04/2022   DDD (degenerative disc disease), lumbar 02/04/2022   Lumbosacral facet syndrome (Multilevel) (Bilateral) 02/04/2022   Osteoarthritis of lumbar spine 02/04/2022   Allergic conjunctivitis of both eyes 12/23/2021   Chronic hip pain (3ry area of Pain) (Bilateral) (L>R) 12/22/2021   Chronic lower extremity pain (2ry area of Pain) (Bilateral) (L>R) 12/22/2021   Abnormal MRI, lumbar spine (01/31/2020) 12/22/2021   Grade 1 Anterolisthesis of lumbar spine (L3/L4) & (5 mm) (L4/L5) 12/22/2021   Chronic pain syndrome 11/17/2021   High risk medication use 11/17/2021   Disorder of skeletal system 11/17/2021   Problems influencing health status 11/17/2021   Other hyperlipidemia 10/21/2021   Polypharmacy 08/12/2021   Chronic low back pain (1ry area of Pain) (Bilateral) (L>R) w/o sciatica 06/18/2021   Osteopenia of neck of femur (Right) 09/06/2020   Obesity 08/29/2020   Parkinson disease (HCC) 02/22/2020   Mucinous cystadenoma 12/24/2019   Bipolar disorder, in full remission, most recent episode depressed (HCC) 11/08/2019   Vitamin D insufficiency 08/24/2019   Insomnia due to mental condition 05/31/2019   Bipolar I disorder, most recent episode depressed (HCC) 05/31/2019   Alcohol use disorder, moderate, in sustained remission (HCC) 05/31/2019   Elevated tumor markers 05/27/2019   GAD (generalized anxiety disorder) 04/10/2019   MDD (major depressive disorder), severe (HCC) 01/31/2019   High serum carbohydrate antigen 19-9 (CA19-9) 12/01/2018   Adjustment disorder with anxious mood 11/24/2018   Essential hypertension 11/24/2018   B12 deficiency 10/17/2018   Incisional hernia, without obstruction or gangrene 09/12/2018   Genetic testing 04/07/2018   Family history of breast cancer 03/24/2018   Family history of lung  cancer 03/24/2018   Malignant neoplasm of upper-outer quadrant of right breast in female, estrogen receptor positive (HCC) 03/17/2018   Malignant neoplasm of upper-inner quadrant of right breast in female, estrogen receptor positive (HCC) 03/17/2018   History of psychosis 02/07/2018   History of insomnia 02/07/2018    ONSET DATE: Pt diagnosed with PD in 2019  REFERRING DIAG:  G20 (ICD-10-CM) - Parkinson's disease      THERAPY DIAG:  Muscle weakness (generalized)  Unsteadiness on feet  Difficulty in walking, not elsewhere classified  Pain in left hip  Rationale for Evaluation and Treatment: Rehabilitation  SUBJECTIVE:   Pt has some pain in her L hip currently. She reports no stumbles/falls or other updates/concerns.  PERTINENT HISTORY:  Pt is a pleasant 77 yo female who was diagnosed with PD in 2019. She is known to clinic and [facility], and was last seen for LSVT BIG Sept-Oct 2022. Pt with chronic pain and has become increasingly sedentary as a result exacerbating mobility difficulties due to PD. Current concerns: Pt reports L and R hip pain and back pain. Pt was being seen by pain management  clinic. Pt has had injections that provided relief at the time. Pt's spouse reports her physician determined surgery was not an option for her pain so the plan is to manage it. Pt spouse thinks pain has resulted in reluctance for pt to move. Pt currently sedentary, doesn't walk much, but when she does she holds on to furniture for support due to balance problems. When pt first stands she must hold on to something due to mix of pain and/or dizziness (described as lightheadedness). Her pain can go from 3/10 to 9/10. It does improve once she starts moving. Pt has increased difficulty with multiple activities including: getting in and out of her bed, getting in and out of her car, freezing, decreased gait speed, decreased balance and cognition, and difficulty using BUE due to tremors.  Per chart other  PMH includes hx of breast cancer, HTN, MCI, insomnia, cataracts, bipolar 1, alcohol use disorder, GAD, MDD, osteopenia of neck of R femur, chronic B low back pain with radiating pain to buttock and posterior thighs and calves. Pt with multiple comorbidities, please refer to chart for full details    PAIN: from eval Are you having pain? Yes 7/10 Left hip   PRECAUTIONS: Fall  WEIGHT BEARING RESTRICTIONS: No  FALLS: Has patient fallen in last 6 months? Yes. Number of falls 1x, leg tangled in chair leg when getting up from a chair  PLOF: Independent with basic ADLs  PATIENT GOALS: improve mobility  OBJECTIVE   TODAY'S TREATMENT:                                                                                                                                         DATE: 09/14/23   Gait belt donned throughout for pt safety  TE: With 3# AW each LE:  Circuit 1-  Gait for strength/endurance with 4WW 444 ft with one seated rest break STS 5x: emphasis on increased speed  Trial 1 - 24 sec Trial 2- 20 sec  Trial 3-  21 sec Comments: PT provided instruction in technique throughout  Circuit 2-  Gait for strength/endurance with 4WW 296 ft with one seated rest break - with 1 seated rest break  STS 5x: emphasis on increased speed Trial 1 - 20 sec Trial 2- 19 sec  Trial 3- 21.5 sec Comments: continued instruction   With 2# aw each LE: Seated LAQ 2x15 x each LE  Gait for speed over 10 meters: 2# aw each LE Trial 1: 18 sec Trial 2: 16 sec Trial 3: 16 sec Trial 4: 13.5 sec  Comments: instruction in technique throughout   Gait for strength/endurance with 4WW 296 ft with one seated rest break        PATIENT EDUCATION:  Education details: Pt educated throughout session about proper posture and technique with exercises. Improved exercise technique, movement at target joints, use of target muscles after min to mod verbal,  visual, tactile cues.  Person educated: Patient and  Spouse Education method: Explanation, Demonstration, Verbal cues, and Handouts Education comprehension: verbalized understanding, returned demonstration, verbal cues required, tactile cues required, and needs further education    HOME EXERCISE PROGRAM:   8//20/24: Access Code: [email or web address] URL: [email or web address] Date: 06/22/2023 Prepared by: Temple Pacini  Exercises - Sit to Stand with Counter Support  - 1 x daily - 4-6 x weekly - 2 sets - 10 reps - Seated Long Arc Quad  - 1 x daily - 4-6 x weekly - 2 sets - 12 reps - Seated Heel Raise  - 1 x daily - 4-6 x weekly - 1 sets - 20 reps    GOALS: Goals reviewed with patient? Yes  SHORT TERM GOALS: Target date: 04/27/2023    Patient will be independent in home exercise program to improve strength/mobility for better functional independence with ADLs. Baseline: LSVT BIG protocol to be initiated next session; 12/18: not yet fully indep, reports she still gets confused about the technique; 12/03/2022: HEP to be updated; Pt reports practicing HEP 2x/week; 02/23/23: not currently performing HEP, reviewed in session and provided new copy; 03/16/2023: pt reports performing HEP every day; 03/30/23: Pt reports she is not being consistent with her HEP; 7/1: pt reports she is not doing much at home; 06/10/23: pt unsure how often she is exercising at home; 10/8: pt repots unsure about her HEP Goal status: MET (previously MET)   LONG TERM GOALS: Target date: 06/08/2023     Patient will increase FOTO score to equal to or greater than 49 to demonstrate statistically significant improvement in mobility and quality of life.  Baseline: 40; 12/18: deferred due to time; 45; 3/19: deferred to next visit; 01/21/2023; 02/23/2023: 46 unchanged from previous assessment); 03/17/2023: 48; 06/10/23: 52 Goal status: MET  2.  Patient (> 28 years old) will complete five times sit to stand test in < 15 seconds indicating decreased fall risk.  Baseline: 27 sec;  12/18 23 sec hands-free; 11/04/22: 24 sec hands free; 2/1/124: 22.7 sec use of BUE, 21.3 sec hands-free; 01/19/23: 18 seconds hands-free; 02/23/23: 19 seconds hands-free; 03/16/2023: 18 seconds hands-free; 03/30/23: 17.3 seconds hands-free; 05/03/23: 17.3 seconds hands-free; 06/10/23: 19 seconds hands-free; 10/8: 16 seconds hands-free  Goal status: PARTIALLY MET  3.  Patient will increase 10 meter walk test to >1.68m/s with WFL B step-length, upright posture for >90% of test to improve ease with gait and to override hypokentic and bradykinetic movement.  Baseline: 0.58 m/s with 5WU; 12/18: 0.7 m/s with 9WJ; 11/04/22: 0.69 m/s with 1BJ;  12/03/22: 0.73 m/s with 4NW; 01/19/2023: 0.77 m/s with 2NF; 02/23/2023: 0.83 m/s with 6OZ; 03/16/2023: 0.83 m/s with 3YQ , pt still somewhat crouched, requires cues for upright posture >50% of time; 03/30/23: 0.83 m/s with crouched posture; 05/03/23 0.8 m/s with RW, some improvement with upright posture; 06/10/23:  0.77 m/s with 4WW and crouched posture; 10/8: 0.74 m/s 4WW Goal status: IN PROGRESS  4.  Patient will reduce time to don jacket by at least 5 seconds in order to increase ease with dressing and override bradykinetic movement Baseline: 17 seconds; 12/18: 12 seconds Goal status: MET  5.  Pt will demo or report ability to open standard jar using only 1-2 attempts in order to exhibit improved functional mobility for ADLs.  Baseline: 5.7 seconds with multiple attempts to turn jar lid to open; 12/18: 2.8 sec opens in 2 turns Goal status: MET  6.  Patient will increase Berg Balance score  by > 6 points to demonstrate decreased fall risk during functional activities. Baseline: to be tested next 1-2 visits; 12/03/2022: 44/56; 01/21/23 45/56; 48/56; 03/17/2023: 48/56; 05/03/23: 50/56; 8/8: 48/56; 08/10/2023: deferred  Goal status: Partially met   7.  Patient will report at least 4 days in a row with minimal to no L hip pain in order to improve QOL and ease/safety with functional  mobility. Baseline: Pt reports pain majority of days in L hip; 12/03/2022: pt reports having hip pain about every day; 3/19: rates hip pain daily (being seen now by MD, new imaging); 02/23/2023: pt went for period of time without hip pain (she thinks a week), but reports it has now come back,; 5/28: pt reports hip pain "every other day"; 05/03/23: pt reports pain in hip every day in past week Goal status: NOT MET/discontinued   8. Patient will increase six minute walk test distance to >1000 for progression to community ambulator and improve gait ability Baseline:02/23/2023: 722 ft with 4WW and two standing rest breaks, rates test as hard; 03/16/2023: deferred to next visit due to time; 5/28/20204: 641 ft with 4WW, pt requests to end test early due to fatigue at 5 min 35 sec; 05/03/23: 645 ft with 4WW and able to complete full test; 06/10/23: 708 ft; 08/10/23: 632 ft with 3 rest breaks  Goal status: IN PROGRESS  ASSESSMENT:  Focus of today's session was on promoting LE and caridoresp endurance and training speed with STS and gait. Pt quick to request rest break due to fatigue, PT provided rest breaks throughout. The pt will benefit from further skilled PT to improve strength, balance, gait and mobility for improved quality of life and decreased risk of falling.   OBJECTIVE IMPAIRMENTS: Abnormal gait, decreased balance, decreased cognition, decreased coordination, decreased endurance, decreased knowledge of use of DME, decreased mobility, difficulty walking, decreased strength, dizziness, hypomobility, impaired UE functional use, improper body mechanics, postural dysfunction, and pain.   ACTIVITY LIMITATIONS: lifting, bending, standing, squatting, stairs, transfers, bed mobility, bathing, dressing, hygiene/grooming, and locomotion level  PARTICIPATION LIMITATIONS: meal prep, cleaning, laundry, medication management, personal finances, driving, shopping, community activity, and yard work  PERSONAL FACTORS: Age,  Fitness, Sex, Time since onset of injury/illness/exacerbation, and 3+ comorbidities: PMH includes hx of breast cancer, HTN, MCI, insomnia, cataracts, bipolar 1, alcohol use disorder, GAD, MDD, osteopenia of neck of R femur, chronic B low back pain with radiating pain to buttock and posterior thighs and calves. Pt with multiple comorbidities, please refer to chart for full details  are also affecting patient's functional outcome.   REHAB POTENTIAL: Fair    CLINICAL DECISION MAKING: Evolving/moderate complexity  EVALUATION COMPLEXITY: High  PLAN:  PT FREQUENCY: 1-2x/week  PT DURATION: 8 weeks  PLANNED INTERVENTIONS: Therapeutic exercises, Therapeutic activity, Neuromuscular re-education, Balance training, Gait training, Patient/Family education, Self Care, Joint mobilization, Stair training, Vestibular training, Canalith repositioning, Visual/preceptual remediation/compensation, Orthotic/Fit training, DME instructions, Wheelchair mobility training, Spinal mobilization, Cryotherapy, Moist heat, Splintting, Taping, and Manual therapy  PLAN FOR NEXT SESSION: endurance, higher level balance in // bars   Temple Pacini PT, DPT  Physical Therapist - [facility]  [facility]  09/14/2023, 1:43 PM

## 2023-09-15 ENCOUNTER — Encounter: Payer: Self-pay | Admitting: Internal Medicine

## 2023-09-21 ENCOUNTER — Ambulatory Visit: Payer: Medicare PPO

## 2023-09-21 DIAGNOSIS — R262 Difficulty in walking, not elsewhere classified: Secondary | ICD-10-CM | POA: Diagnosis not present

## 2023-09-21 DIAGNOSIS — R2681 Unsteadiness on feet: Secondary | ICD-10-CM | POA: Diagnosis not present

## 2023-09-21 DIAGNOSIS — R278 Other lack of coordination: Secondary | ICD-10-CM | POA: Diagnosis not present

## 2023-09-21 DIAGNOSIS — M6281 Muscle weakness (generalized): Secondary | ICD-10-CM

## 2023-09-21 DIAGNOSIS — M25552 Pain in left hip: Secondary | ICD-10-CM | POA: Diagnosis not present

## 2023-09-21 NOTE — Therapy (Signed)
OUTPATIENT PHYSICAL THERAPY TREATMENT/Physical Therapy Progress Note   Dates of reporting period  06/10/2023   to   09/21/2023       Patient Name: Kendra [name] MRN: [id number] DOB:May 19, 1946, 77 y.o., female Today's Date: 09/21/2023  PCP: Erasmo Downer, MD REFERRING PROVIDER: Erasmo Downer, MD  END OF SESSION:  PT End of Session - 09/21/23 1145     Visit Number 70    Number of Visits 74    Date for PT Re-Evaluation 10/05/23    Authorization Type [employer] PPO    Authorization Time Period 06/10/23-08/05/23    Progress Note Due on Visit 70    PT Start Time 1148    PT Stop Time 1228    PT Time Calculation (min) 40 min    Equipment Utilized During Treatment Gait belt    Activity Tolerance Patient tolerated treatment well;Patient limited by fatigue    Behavior During Therapy WFL for tasks assessed/performed                                  Past Medical History:  Diagnosis Date   Anxiety    Breast cancer (HCC) 03/2018   right breast cancer at 11:00 and 1:00   Bursitis    bilateral hips and knees   Complication of anesthesia    Depression    Family history of breast cancer 03/24/2018   Family history of lung cancer 03/24/2018   History of alcohol dependence (HCC) 2007   no ETOH; resolved since 2007   History of hiatal hernia    History of psychosis 2014   due to sleep disturbance   Hypertension    Parkinson disease (HCC)    Personal history of radiation therapy 06/2018-07/2018   right breast ca   PONV (postoperative nausea and vomiting)    Past Surgical History:  Procedure Laterality Date   BREAST BIOPSY Right 03/14/2018   11:00 DCIS and invasive ductal carcinoma   BREAST BIOPSY Right 03/14/2018   1:00 Invasive ductal carcinoma   BREAST LUMPECTOMY Right 04/27/2018   lumpectomy of 11 and 1:00 cancers, clear margins, negative LN   BREAST LUMPECTOMY WITH RADIOACTIVE SEED AND SENTINEL LYMPH NODE BIOPSY Right  04/27/2018   Procedure: RIGHT BREAST LUMPECTOMY WITH RADIOACTIVE SEED X 2 AND RIGHT SENTINEL LYMPH NODE BIOPSY;  Surgeon: Glenna Fellows, MD;  Location: [facility];  Service: General;  Laterality: Right;   CATARACT EXTRACTION W/PHACO Left 08/30/2019   Procedure: CATARACT EXTRACTION PHACO AND INTRAOCULAR LENS PLACEMENT (IOC) LEFT panoptix toric  01:20.5  12.7%  10.26;  Surgeon: Lockie Mola, MD;  Location: [facility];  Service: Ophthalmology;  Laterality: Left;   CATARACT EXTRACTION W/PHACO Right 09/20/2019   Procedure: CATARACT EXTRACTION PHACO AND INTRAOCULAR LENS PLACEMENT (IOC) RIGHT 5.71  00:36.4  15.7%;  Surgeon: Lockie Mola, MD;  Location: [facility];  Service: Ophthalmology;  Laterality: Right;   LAPAROTOMY N/A 02/10/2018   Procedure: EXPLORATORY LAPAROTOMY;  Surgeon: Shonna Chock, MD;  Location: [facility];  Service: Gynecology;  Laterality: N/A;   MASS EXCISION  01/2018   abdominal   OVARIAN CYST REMOVAL     SALPINGOOPHORECTOMY Bilateral 02/10/2018   Procedure: BILATERAL SALPINGO OOPHORECTOMY; PERITONEAL WASHINGS;  Surgeon: Shonna Chock, MD;  Location: [facility];  Service: Gynecology;  Laterality: Bilateral;   TONSILLECTOMY     TONSILLECTOMY AND ADENOIDECTOMY  1953   Patient Active Problem List  Diagnosis Date Noted   Tardive dyskinesia 09/09/2023   Snoring 09/09/2023   Neurogenic bladder 05/14/2023   Prediabetes 12/15/2022   Chronic cough 08/17/2022   History of allergy to shellfish 02/24/2022   Chronic low back pain of over 3 months duration 02/04/2022   Osteoarthritis of  AC (acromioclavicular) joint (Left) 02/04/2022   Osteoarthritis of hip (Right) 02/04/2022   Impaction fracture of distal end of radius and ulna, sequela (Left) 02/04/2022   Osteoarthritis of 1st (thumb) carpometacarpal (CMC) joint of hand (Left) 02/04/2022   Lumbosacral facet arthropathy (Multilevel) (Bilateral) (T12-S1) 02/04/2022   Lumbar central spinal  stenosis w/o neurogenic claudication (L2-3, L3-4, L4-5) 02/04/2022   Lumbar foraminal stenosis (Bilateral: L2-3, L4-5) (Right: (Severe) L4-5) 02/04/2022   Lumbar lateral recess stenosis (Bilateral: L2-3, L3-4, and L4-5) (Severe) 02/04/2022   DDD (degenerative disc disease), lumbar 02/04/2022   Lumbosacral facet syndrome (Multilevel) (Bilateral) 02/04/2022   Osteoarthritis of lumbar spine 02/04/2022   Allergic conjunctivitis of both eyes 12/23/2021   Chronic hip pain (3ry area of Pain) (Bilateral) (L>R) 12/22/2021   Chronic lower extremity pain (2ry area of Pain) (Bilateral) (L>R) 12/22/2021   Abnormal MRI, lumbar spine (01/31/2020) 12/22/2021   Grade 1 Anterolisthesis of lumbar spine (L3/L4) & (5 mm) (L4/L5) 12/22/2021   Chronic pain syndrome 11/17/2021   High risk medication use 11/17/2021   Disorder of skeletal system 11/17/2021   Problems influencing health status 11/17/2021   Other hyperlipidemia 10/21/2021   Polypharmacy 08/12/2021   Chronic low back pain (1ry area of Pain) (Bilateral) (L>R) w/o sciatica 06/18/2021   Osteopenia of neck of femur (Right) 09/06/2020   Obesity 08/29/2020   Parkinson disease (HCC) 02/22/2020   Mucinous cystadenoma 12/24/2019   Bipolar disorder, in full remission, most recent episode depressed (HCC) 11/08/2019   Vitamin D insufficiency 08/24/2019   Insomnia due to mental condition 05/31/2019   Bipolar I disorder, most recent episode depressed (HCC) 05/31/2019   Alcohol use disorder, moderate, in sustained remission (HCC) 05/31/2019   Elevated tumor markers 05/27/2019   GAD (generalized anxiety disorder) 04/10/2019   MDD (major depressive disorder), severe (HCC) 01/31/2019   High serum carbohydrate antigen 19-9 (CA19-9) 12/01/2018   Adjustment disorder with anxious mood 11/24/2018   Essential hypertension 11/24/2018   B12 deficiency 10/17/2018   Incisional hernia, without obstruction or gangrene 09/12/2018   Genetic testing 04/07/2018   Family  history of breast cancer 03/24/2018   Family history of lung cancer 03/24/2018   Malignant neoplasm of upper-outer quadrant of right breast in female, estrogen receptor positive (HCC) 03/17/2018   Malignant neoplasm of upper-inner quadrant of right breast in female, estrogen receptor positive (HCC) 03/17/2018   History of psychosis 02/07/2018   History of insomnia 02/07/2018    ONSET DATE: Pt diagnosed with PD in 2019  REFERRING DIAG:  G20 (ICD-10-CM) - Parkinson's disease      THERAPY DIAG:  Muscle weakness (generalized)  Difficulty in walking, not elsewhere classified  Unsteadiness on feet  Rationale for Evaluation and Treatment: Rehabilitation  SUBJECTIVE:   Pt reports no pain at the moment. No changes.  PERTINENT HISTORY:  Pt is a pleasant 77 yo female who was diagnosed with PD in 2019. She is known to clinic and [facility], and was last seen for LSVT BIG Sept-Oct 2022. Pt with chronic pain and has become increasingly sedentary as a result exacerbating mobility difficulties due to PD. Current concerns: Pt reports L and R hip pain and back pain. Pt was being seen by pain management clinic.  Pt has had injections that provided relief at the time. Pt's spouse reports her physician determined surgery was not an option for her pain so the plan is to manage it. Pt spouse thinks pain has resulted in reluctance for pt to move. Pt currently sedentary, doesn't walk much, but when she does she holds on to furniture for support due to balance problems. When pt first stands she must hold on to something due to mix of pain and/or dizziness (described as lightheadedness). Her pain can go from 3/10 to 9/10. It does improve once she starts moving. Pt has increased difficulty with multiple activities including: getting in and out of her bed, getting in and out of her car, freezing, decreased gait speed, decreased balance and cognition, and difficulty using BUE due to tremors.  Per chart other PMH includes  hx of breast cancer, HTN, MCI, insomnia, cataracts, bipolar 1, alcohol use disorder, GAD, MDD, osteopenia of neck of R femur, chronic B low back pain with radiating pain to buttock and posterior thighs and calves. Pt with multiple comorbidities, please refer to chart for full details    PAIN: from eval Are you having pain? Yes 7/10 Left hip   PRECAUTIONS: Fall  WEIGHT BEARING RESTRICTIONS: No  FALLS: Has patient fallen in last 6 months? Yes. Number of falls 1x, leg tangled in chair leg when getting up from a chair  PLOF: Independent with basic ADLs  PATIENT GOALS: improve mobility  OBJECTIVE   TODAY'S TREATMENT:                                                                                                                                         DATE: 09/21/23   Gait belt donned throughout for pt safety  TA: Goal reassessment completed for progress visit. Please refer to goal section and assessment below for details.   PATIENT EDUCATION:  Education details: Pt educated throughout session about proper posture and technique with exercises. Improved exercise technique, movement at target joints, use of target muscles after min to mod verbal, visual, tactile cues. Goals, goal performance  Person educated: Patient Education method: Explanation, Demonstration, Tactile cues, and Verbal cues Education comprehension: verbalized understanding, returned demonstration, verbal cues required, tactile cues required, and needs further education    HOME EXERCISE PROGRAM:   8//20/24: Access Code: [email or web address] URL: [email or web address] Date: 06/22/2023 Prepared by: Temple Pacini  Exercises - Sit to Stand with Counter Support  - 1 x daily - 4-6 x weekly - 2 sets - 10 reps - Seated Long Arc Quad  - 1 x daily - 4-6 x weekly - 2 sets - 12 reps - Seated Heel Raise  - 1 x daily - 4-6 x weekly - 1 sets - 20 reps    GOALS: Goals reviewed with patient? Yes  SHORT TERM GOALS: Target  date: 04/27/2023    Patient will be independent in home exercise program  to improve strength/mobility for better functional independence with ADLs. Baseline: LSVT BIG protocol to be initiated next session; 12/18: not yet fully indep, reports she still gets confused about the technique; 12/03/2022: HEP to be updated; Pt reports practicing HEP 2x/week; 02/23/23: not currently performing HEP, reviewed in session and provided new copy; 03/16/2023: pt reports performing HEP every day; 03/30/23: Pt reports she is not being consistent with her HEP; 7/1: pt reports she is not doing much at home; 06/10/23: pt unsure how often she is exercising at home; 10/8: pt repots unsure about her HEP Goal status: MET (previously MET)   LONG TERM GOALS: Target date: 06/08/2023     Patient will increase FOTO score to equal to or greater than 49 to demonstrate statistically significant improvement in mobility and quality of life.  Baseline: 40; 12/18: deferred due to time; 45; 3/19: deferred to next visit; 01/21/2023; 02/23/2023: 46 unchanged from previous assessment); 03/17/2023: 48; 06/10/23: 52 Goal status: MET  2.  Patient (> 90 years old) will complete five times sit to stand test in < 15 seconds indicating decreased fall risk.  Baseline: 27 sec; 12/18 23 sec hands-free; 11/04/22: 24 sec hands free; 2/1/124: 22.7 sec use of BUE, 21.3 sec hands-free; 01/19/23: 18 seconds hands-free; 02/23/23: 19 seconds hands-free; 03/16/2023: 18 seconds hands-free; 03/30/23: 17.3 seconds hands-free; 05/03/23: 17.3 seconds hands-free; 06/10/23: 19 seconds hands-free; 10/8: 16 seconds hands-free; 09/21/23: 17 sec hands-free  Goal status: PARTIALLY MET  3.  Patient will increase 10 meter walk test to >1.64m/s with WFL B step-length, upright posture for >90% of test to improve ease with gait and to override hypokentic and bradykinetic movement.  Baseline: 0.58 m/s with 5VV; 12/18: 0.7 m/s with 6HY; 11/04/22: 0.69 m/s with 0VP;  12/03/22: 0.73 m/s with 7TG;  01/19/2023: 0.77 m/s with 6YI; 02/23/2023: 0.83 m/s with 9SW; 03/16/2023: 0.83 m/s with 5IO , pt still somewhat crouched, requires cues for upright posture >50% of time; 03/30/23: 0.83 m/s with crouched posture; 05/03/23 0.8 m/s with RW, some improvement with upright posture; 06/10/23:  0.77 m/s with 2VO and crouched posture; 10/8: 0.74 m/s 4WW; 09/21/23: 0.71 m/s with 4WW Goal status: IN PROGRESS  4.  Patient will reduce time to don jacket by at least 5 seconds in order to increase ease with dressing and override bradykinetic movement Baseline: 17 seconds; 12/18: 12 seconds Goal status: MET  5.  Pt will demo or report ability to open standard jar using only 1-2 attempts in order to exhibit improved functional mobility for ADLs.  Baseline: 5.7 seconds with multiple attempts to turn jar lid to open; 12/18: 2.8 sec opens in 2 turns Goal status: MET  6.  Patient will increase Berg Balance score by > 6 points to demonstrate decreased fall risk during functional activities. Baseline: to be tested next 1-2 visits; 12/03/2022: 44/56; 01/21/23 45/56; 48/56; 03/17/2023: 48/56; 05/03/23: 35/00; 8/8: 48/56; 08/10/2023: deferred; 11/19: 51/56  Goal status: GOAL MET  7.  Patient will report at least 4 days in a row with minimal to no L hip pain in order to improve QOL and ease/safety with functional mobility. Baseline: Pt reports pain majority of days in L hip; 12/03/2022: pt reports having hip pain about every day; 3/19: rates hip pain daily (being seen now by MD, new imaging); 02/23/2023: pt went for period of time without hip pain (she thinks a week), but reports it has now come back,; 5/28: pt reports hip pain "every other day"; 05/03/23: pt reports pain in hip  every day in past week Goal status: NOT MET/discontinued   8. Patient will increase six minute walk test distance to >1000 for progression to community ambulator and improve gait ability Baseline:02/23/2023: 722 ft with 4WW and two standing rest breaks, rates test as  hard; 03/16/2023: deferred to next visit due to time; 5/28/20204: 641 ft with 4WW, pt requests to end test early due to fatigue at 5 min 35 sec; 05/03/23: 645 ft with 4WW and able to complete full test; 06/10/23: 708 ft; 08/10/23: 632 ft with 3 rest breaks; 11/19: 592 ft with 4WW, pt requests to end test early at about 5 min due to fatigue  Goal status: IN PROGRESS   ASSESSMENT:  Goal reassessment completed for progress visit. Pt has now met BERG goal, indicating improved balance and decrease in fall risk, although pt still a fall risk AEB 5xSTS performance. Although pt made gains on BERG test, pt with decreased performance on , and slight decrease in performance on and 5xSTS. The pt will benefit from further skilled PT to improve strength, balance, gait and mobility for improved quality of life and decreased risk of falling.   OBJECTIVE IMPAIRMENTS: Abnormal gait, decreased balance, decreased cognition, decreased coordination, decreased endurance, decreased knowledge of use of DME, decreased mobility, difficulty walking, decreased strength, dizziness, hypomobility, impaired UE functional use, improper body mechanics, postural dysfunction, and pain.   ACTIVITY LIMITATIONS: lifting, bending, standing, squatting, stairs, transfers, bed mobility, bathing, dressing, hygiene/grooming, and locomotion level  PARTICIPATION LIMITATIONS: meal prep, cleaning, laundry, medication management, personal finances, driving, shopping, community activity, and yard work  PERSONAL FACTORS: Age, Fitness, Sex, Time since onset of injury/illness/exacerbation, and 3+ comorbidities: PMH includes hx of breast cancer, HTN, MCI, insomnia, cataracts, bipolar 1, alcohol use disorder, GAD, MDD, osteopenia of neck of R femur, chronic B low back pain with radiating pain to buttock and posterior thighs and calves. Pt with multiple comorbidities, please refer to chart for full details  are also affecting patient's functional outcome.    REHAB POTENTIAL: Fair    CLINICAL DECISION MAKING: Evolving/moderate complexity  EVALUATION COMPLEXITY: High  PLAN:  PT FREQUENCY: 1-2x/week  PT DURATION: 8 weeks  PLANNED INTERVENTIONS: Therapeutic exercises, Therapeutic activity, Neuromuscular re-education, Balance training, Gait training, Patient/Family education, Self Care, Joint mobilization, Stair training, Vestibular training, Canalith repositioning, Visual/preceptual remediation/compensation, Orthotic/Fit training, DME instructions, Wheelchair mobility training, Spinal mobilization, Cryotherapy, Moist heat, Splintting, Taping, and Manual therapy  PLAN FOR NEXT SESSION: endurance, higher level balance in // bars   Temple Pacini PT, DPT  Physical Therapist - [facility]  [facility]  09/21/2023, 2:22 PM

## 2023-09-28 ENCOUNTER — Ambulatory Visit: Payer: Medicare PPO

## 2023-09-28 DIAGNOSIS — R278 Other lack of coordination: Secondary | ICD-10-CM | POA: Diagnosis not present

## 2023-09-28 DIAGNOSIS — M6281 Muscle weakness (generalized): Secondary | ICD-10-CM | POA: Diagnosis not present

## 2023-09-28 DIAGNOSIS — M25552 Pain in left hip: Secondary | ICD-10-CM

## 2023-09-28 DIAGNOSIS — R2681 Unsteadiness on feet: Secondary | ICD-10-CM

## 2023-09-28 DIAGNOSIS — R262 Difficulty in walking, not elsewhere classified: Secondary | ICD-10-CM

## 2023-09-28 NOTE — Therapy (Signed)
OUTPATIENT PHYSICAL THERAPY TREATMENT       Patient Name: Kendra [name] MRN: [id number] DOB:25-Sep-1946, 77 y.o., female Today's Date: 09/28/2023  PCP: Erasmo Downer, MD REFERRING PROVIDER: Erasmo Downer, MD  END OF SESSION:                         Past Medical History:  Diagnosis Date   Anxiety    Breast cancer (HCC) 03/2018   right breast cancer at 11:00 and 1:00   Bursitis    bilateral hips and knees   Complication of anesthesia    Depression    Family history of breast cancer 03/24/2018   Family history of lung cancer 03/24/2018   History of alcohol dependence (HCC) 2007   no ETOH; resolved since 2007   History of hiatal hernia    History of psychosis 2014   due to sleep disturbance   Hypertension    Parkinson disease (HCC)    Personal history of radiation therapy 06/2018-07/2018   right breast ca   PONV (postoperative nausea and vomiting)    Past Surgical History:  Procedure Laterality Date   BREAST BIOPSY Right 03/14/2018   11:00 DCIS and invasive ductal carcinoma   BREAST BIOPSY Right 03/14/2018   1:00 Invasive ductal carcinoma   BREAST LUMPECTOMY Right 04/27/2018   lumpectomy of 11 and 1:00 cancers, clear margins, negative LN   BREAST LUMPECTOMY WITH RADIOACTIVE SEED AND SENTINEL LYMPH NODE BIOPSY Right 04/27/2018   Procedure: RIGHT BREAST LUMPECTOMY WITH RADIOACTIVE SEED X 2 AND RIGHT SENTINEL LYMPH NODE BIOPSY;  Surgeon: Glenna Fellows, MD;  Location: [facility];  Service: General;  Laterality: Right;   CATARACT EXTRACTION W/PHACO Left 08/30/2019   Procedure: CATARACT EXTRACTION PHACO AND INTRAOCULAR LENS PLACEMENT (IOC) LEFT panoptix toric  01:20.5  12.7%  10.26;  Surgeon: Lockie Mola, MD;  Location: [facility];  Service: Ophthalmology;  Laterality: Left;   CATARACT EXTRACTION W/PHACO Right 09/20/2019   Procedure: CATARACT EXTRACTION PHACO AND INTRAOCULAR LENS PLACEMENT (IOC) RIGHT  5.71  00:36.4  15.7%;  Surgeon: Lockie Mola, MD;  Location: [facility];  Service: Ophthalmology;  Laterality: Right;   LAPAROTOMY N/A 02/10/2018   Procedure: EXPLORATORY LAPAROTOMY;  Surgeon: Shonna Chock, MD;  Location: [facility];  Service: Gynecology;  Laterality: N/A;   MASS EXCISION  01/2018   abdominal   OVARIAN CYST REMOVAL     SALPINGOOPHORECTOMY Bilateral 02/10/2018   Procedure: BILATERAL SALPINGO OOPHORECTOMY; PERITONEAL WASHINGS;  Surgeon: Shonna Chock, MD;  Location: [facility];  Service: Gynecology;  Laterality: Bilateral;   TONSILLECTOMY     TONSILLECTOMY AND ADENOIDECTOMY  1953   Patient Active Problem List   Diagnosis Date Noted   Tardive dyskinesia 09/09/2023   Snoring 09/09/2023   Neurogenic bladder 05/14/2023   Prediabetes 12/15/2022   Chronic cough 08/17/2022   History of allergy to shellfish 02/24/2022   Chronic low back pain of over 3 months duration 02/04/2022   Osteoarthritis of  AC (acromioclavicular) joint (Left) 02/04/2022   Osteoarthritis of hip (Right) 02/04/2022   Impaction fracture of distal end of radius and ulna, sequela (Left) 02/04/2022   Osteoarthritis of 1st (thumb) carpometacarpal (CMC) joint of hand (Left) 02/04/2022   Lumbosacral facet arthropathy (Multilevel) (Bilateral) (T12-S1) 02/04/2022   Lumbar central spinal stenosis w/o neurogenic claudication (L2-3, L3-4, L4-5) 02/04/2022   Lumbar foraminal stenosis (Bilateral: L2-3, L4-5) (Right: (Severe) L4-5) 02/04/2022   Lumbar lateral recess stenosis (Bilateral: L2-3, L3-4, and L4-5) (  Severe) 02/04/2022   DDD (degenerative disc disease), lumbar 02/04/2022   Lumbosacral facet syndrome (Multilevel) (Bilateral) 02/04/2022   Osteoarthritis of lumbar spine 02/04/2022   Allergic conjunctivitis of both eyes 12/23/2021   Chronic hip pain (3ry area of Pain) (Bilateral) (L>R) 12/22/2021   Chronic lower extremity pain (2ry area of Pain) (Bilateral) (L>R) 12/22/2021   Abnormal MRI, lumbar  spine (01/31/2020) 12/22/2021   Grade 1 Anterolisthesis of lumbar spine (L3/L4) & (5 mm) (L4/L5) 12/22/2021   Chronic pain syndrome 11/17/2021   High risk medication use 11/17/2021   Disorder of skeletal system 11/17/2021   Problems influencing health status 11/17/2021   Other hyperlipidemia 10/21/2021   Polypharmacy 08/12/2021   Chronic low back pain (1ry area of Pain) (Bilateral) (L>R) w/o sciatica 06/18/2021   Osteopenia of neck of femur (Right) 09/06/2020   Obesity 08/29/2020   Parkinson disease (HCC) 02/22/2020   Mucinous cystadenoma 12/24/2019   Bipolar disorder, in full remission, most recent episode depressed (HCC) 11/08/2019   Vitamin D insufficiency 08/24/2019   Insomnia due to mental condition 05/31/2019   Bipolar I disorder, most recent episode depressed (HCC) 05/31/2019   Alcohol use disorder, moderate, in sustained remission (HCC) 05/31/2019   Elevated tumor markers 05/27/2019   GAD (generalized anxiety disorder) 04/10/2019   MDD (major depressive disorder), severe (HCC) 01/31/2019   High serum carbohydrate antigen 19-9 (CA19-9) 12/01/2018   Adjustment disorder with anxious mood 11/24/2018   Essential hypertension 11/24/2018   B12 deficiency 10/17/2018   Incisional hernia, without obstruction or gangrene 09/12/2018   Genetic testing 04/07/2018   Family history of breast cancer 03/24/2018   Family history of lung cancer 03/24/2018   Malignant neoplasm of upper-outer quadrant of right breast in female, estrogen receptor positive (HCC) 03/17/2018   Malignant neoplasm of upper-inner quadrant of right breast in female, estrogen receptor positive (HCC) 03/17/2018   History of psychosis 02/07/2018   History of insomnia 02/07/2018    ONSET DATE: Pt diagnosed with PD in 2019  REFERRING DIAG:  G20 (ICD-10-CM) - Parkinson's disease      THERAPY DIAG:  No diagnosis found.  Rationale for Evaluation and Treatment: Rehabilitation  SUBJECTIVE:   Pt reports no pain at the  moment. No changes or updates.  PERTINENT HISTORY:  Pt is a pleasant 77 yo female who was diagnosed with PD in 2019. She is known to clinic and [facility], and was last seen for LSVT BIG Sept-Oct 2022. Pt with chronic pain and has become increasingly sedentary as a result exacerbating mobility difficulties due to PD. Current concerns: Pt reports L and R hip pain and back pain. Pt was being seen by pain management clinic. Pt has had injections that provided relief at the time. Pt's spouse reports her physician determined surgery was not an option for her pain so the plan is to manage it. Pt spouse thinks pain has resulted in reluctance for pt to move. Pt currently sedentary, doesn't walk much, but when she does she holds on to furniture for support due to balance problems. When pt first stands she must hold on to something due to mix of pain and/or dizziness (described as lightheadedness). Her pain can go from 3/10 to 9/10. It does improve once she starts moving. Pt has increased difficulty with multiple activities including: getting in and out of her bed, getting in and out of her car, freezing, decreased gait speed, decreased balance and cognition, and difficulty using BUE due to tremors.  Per chart other PMH includes hx of  breast cancer, HTN, MCI, insomnia, cataracts, bipolar 1, alcohol use disorder, GAD, MDD, osteopenia of neck of R femur, chronic B low back pain with radiating pain to buttock and posterior thighs and calves. Pt with multiple comorbidities, please refer to chart for full details    PAIN: from eval Are you having pain? Yes 7/10 Left hip   PRECAUTIONS: Fall  WEIGHT BEARING RESTRICTIONS: No  FALLS: Has patient fallen in last 6 months? Yes. Number of falls 1x, leg tangled in chair leg when getting up from a chair  PLOF: Independent with basic ADLs  PATIENT GOALS: improve mobility  OBJECTIVE   TODAY'S TREATMENT:                                                                                                                                          DATE: 09/28/23   Gait belt donned throughout for pt safety  NMR: Large amplitude movement training with additional focus on HIIT -  LTL step and reach,  2x10 each direction FWD step and reach each side 2x10 each LE/UE Standing cross-body reach and twist 2x10 each side STS with bilat shoulder abduction 2x10 Comments: brief rest breaks taken throughout   TE: Gait for strength/endurance with 4WW x296 ft - limited due to fatigue  With 5# aw donned each LE, seated: LAQ 2x10 each LE  March 2x10 each LE- rates hard BTB hamstring curls 2x10 each LE - rates medium Seated hip IR step-outs 2x12 each LE Seated hip ER 2x10 each LE rates moderate       PATIENT EDUCATION:  Education details: Pt educated throughout session about proper posture and technique with exercises. Improved exercise technique, movement at target joints, use of target muscles after min to mod verbal, visual, tactile cues.   Person educated: Patient Education method: Explanation, Demonstration, Tactile cues, and Verbal cues Education comprehension: verbalized understanding, returned demonstration, verbal cues required, tactile cues required, and needs further education    HOME EXERCISE PROGRAM:   8//20/24: Access Code: [email or web address] URL: [email or web address] Date: 06/22/2023 Prepared by: Temple Pacini  Exercises - Sit to Stand with Counter Support  - 1 x daily - 4-6 x weekly - 2 sets - 10 reps - Seated Long Arc Quad  - 1 x daily - 4-6 x weekly - 2 sets - 12 reps - Seated Heel Raise  - 1 x daily - 4-6 x weekly - 1 sets - 20 reps    GOALS: Goals reviewed with patient? Yes  SHORT TERM GOALS: Target date: 04/27/2023    Patient will be independent in home exercise program to improve strength/mobility for better functional independence with ADLs. Baseline: LSVT BIG protocol to be initiated next session; 12/18: not yet fully indep,  reports she still gets confused about the technique; 12/03/2022: HEP to be updated; Pt reports practicing HEP 2x/week; 02/23/23: not currently performing HEP, reviewed in session and provided new  copy; 03/16/2023: pt reports performing HEP every day; 03/30/23: Pt reports she is not being consistent with her HEP; 7/1: pt reports she is not doing much at home; 06/10/23: pt unsure how often she is exercising at home; 10/8: pt repots unsure about her HEP Goal status: MET (previously MET)   LONG TERM GOALS: Target date: 06/08/2023     Patient will increase FOTO score to equal to or greater than 49 to demonstrate statistically significant improvement in mobility and quality of life.  Baseline: 40; 12/18: deferred due to time; 45; 3/19: deferred to next visit; 01/21/2023; 02/23/2023: 46 unchanged from previous assessment); 03/17/2023: 48; 06/10/23: 52 Goal status: MET  2.  Patient (> 46 years old) will complete five times sit to stand test in < 15 seconds indicating decreased fall risk.  Baseline: 27 sec; 12/18 23 sec hands-free; 11/04/22: 24 sec hands free; 2/1/124: 22.7 sec use of BUE, 21.3 sec hands-free; 01/19/23: 18 seconds hands-free; 02/23/23: 19 seconds hands-free; 03/16/2023: 18 seconds hands-free; 03/30/23: 17.3 seconds hands-free; 05/03/23: 17.3 seconds hands-free; 06/10/23: 19 seconds hands-free; 10/8: 16 seconds hands-free; 09/21/23: 17 sec hands-free  Goal status: PARTIALLY MET  3.  Patient will increase 10 meter walk test to >1.64m/s with WFL B step-length, upright posture for >90% of test to improve ease with gait and to override hypokentic and bradykinetic movement.  Baseline: 0.58 m/s with 5GL; 12/18: 0.7 m/s with 8VF; 11/04/22: 0.69 m/s with 6EP;  12/03/22: 0.73 m/s with 3IR; 01/19/2023: 0.77 m/s with 5JO; 02/23/2023: 0.83 m/s with 8CZ; 03/16/2023: 0.83 m/s with 6SA , pt still somewhat crouched, requires cues for upright posture >50% of time; 03/30/23: 0.83 m/s with crouched posture; 05/03/23 0.8 m/s with RW, some  improvement with upright posture; 06/10/23:  0.77 m/s with 6TK and crouched posture; 10/8: 0.74 m/s 4WW; 09/21/23: 0.71 m/s with 4WW Goal status: IN PROGRESS  4.  Patient will reduce time to don jacket by at least 5 seconds in order to increase ease with dressing and override bradykinetic movement Baseline: 17 seconds; 12/18: 12 seconds Goal status: MET  5.  Pt will demo or report ability to open standard jar using only 1-2 attempts in order to exhibit improved functional mobility for ADLs.  Baseline: 5.7 seconds with multiple attempts to turn jar lid to open; 12/18: 2.8 sec opens in 2 turns Goal status: MET  6.  Patient will increase Berg Balance score by > 6 points to demonstrate decreased fall risk during functional activities. Baseline: to be tested next 1-2 visits; 12/03/2022: 44/56; 01/21/23 45/56; 48/56; 03/17/2023: 48/56; 05/03/23: 16/01; 8/8: 48/56; 08/10/2023: deferred; 11/19: 51/56  Goal status: GOAL MET  7.  Patient will report at least 4 days in a row with minimal to no L hip pain in order to improve QOL and ease/safety with functional mobility. Baseline: Pt reports pain majority of days in L hip; 12/03/2022: pt reports having hip pain about every day; 3/19: rates hip pain daily (being seen now by MD, new imaging); 02/23/2023: pt went for period of time without hip pain (she thinks a week), but reports it has now come back,; 5/28: pt reports hip pain "every other day"; 05/03/23: pt reports pain in hip every day in past week Goal status: NOT MET/discontinued   8. Patient will increase six minute walk test distance to >1000 for progression to community ambulator and improve gait ability Baseline:02/23/2023: 722 ft with 4WW and two standing rest breaks, rates test as hard; 03/16/2023: deferred to next visit due to  time; 5/28/20204: 641 ft with 4WW, pt requests to end test early due to fatigue at 5 min 35 sec; 05/03/23: 645 ft with 4WW and able to complete full test; 06/10/23: 708 ft; 08/10/23: 632 ft with 3  rest breaks; 11/19: 592 ft with 4WW, pt requests to end test early at about 5 min due to fatigue  Goal status: IN PROGRESS   ASSESSMENT:  Pt tolerated interventions fair today. She was able to return to using heavier ankle weights to complete therex, but was limited with gait due to both fatigue and L hip pain (chronic). PT modified remaining interventions to seated to allow for pain modulation. The pt will benefit from further skilled PT to improve strength, balance, gait and mobility for improved quality of life and decreased risk of falling.   OBJECTIVE IMPAIRMENTS: Abnormal gait, decreased balance, decreased cognition, decreased coordination, decreased endurance, decreased knowledge of use of DME, decreased mobility, difficulty walking, decreased strength, dizziness, hypomobility, impaired UE functional use, improper body mechanics, postural dysfunction, and pain.   ACTIVITY LIMITATIONS: lifting, bending, standing, squatting, stairs, transfers, bed mobility, bathing, dressing, hygiene/grooming, and locomotion level  PARTICIPATION LIMITATIONS: meal prep, cleaning, laundry, medication management, personal finances, driving, shopping, community activity, and yard work  PERSONAL FACTORS: Age, Fitness, Sex, Time since onset of injury/illness/exacerbation, and 3+ comorbidities: PMH includes hx of breast cancer, HTN, MCI, insomnia, cataracts, bipolar 1, alcohol use disorder, GAD, MDD, osteopenia of neck of R femur, chronic B low back pain with radiating pain to buttock and posterior thighs and calves. Pt with multiple comorbidities, please refer to chart for full details  are also affecting patient's functional outcome.   REHAB POTENTIAL: Fair    CLINICAL DECISION MAKING: Evolving/moderate complexity  EVALUATION COMPLEXITY: High  PLAN:  PT FREQUENCY: 1-2x/week  PT DURATION: 8 weeks  PLANNED INTERVENTIONS: Therapeutic exercises, Therapeutic activity, Neuromuscular re-education, Balance  training, Gait training, Patient/Family education, Self Care, Joint mobilization, Stair training, Vestibular training, Canalith repositioning, Visual/preceptual remediation/compensation, Orthotic/Fit training, DME instructions, Wheelchair mobility training, Spinal mobilization, Cryotherapy, Moist heat, Splintting, Taping, and Manual therapy  PLAN FOR NEXT SESSION: endurance, higher level balance in // bars, strength   Temple Pacini PT, DPT  Physical Therapist - [place]  [facility]  09/28/2023, 8:23 AM

## 2023-10-03 ENCOUNTER — Other Ambulatory Visit: Payer: Self-pay | Admitting: Internal Medicine

## 2023-10-04 ENCOUNTER — Encounter: Payer: Self-pay | Admitting: Internal Medicine

## 2023-10-05 ENCOUNTER — Ambulatory Visit: Payer: Medicare PPO | Attending: Family Medicine

## 2023-10-05 DIAGNOSIS — M25552 Pain in left hip: Secondary | ICD-10-CM | POA: Diagnosis not present

## 2023-10-05 DIAGNOSIS — R2681 Unsteadiness on feet: Secondary | ICD-10-CM | POA: Insufficient documentation

## 2023-10-05 DIAGNOSIS — R278 Other lack of coordination: Secondary | ICD-10-CM | POA: Insufficient documentation

## 2023-10-05 DIAGNOSIS — M5459 Other low back pain: Secondary | ICD-10-CM | POA: Diagnosis not present

## 2023-10-05 DIAGNOSIS — M6281 Muscle weakness (generalized): Secondary | ICD-10-CM | POA: Insufficient documentation

## 2023-10-05 DIAGNOSIS — R262 Difficulty in walking, not elsewhere classified: Secondary | ICD-10-CM | POA: Insufficient documentation

## 2023-10-05 NOTE — Therapy (Signed)
OUTPATIENT PHYSICAL THERAPY TREATMENT       Patient Name: Kendra [name] MRN: [id number] DOB:03-18-1946, 77 y.o., female Today's Date: 10/05/2023  PCP: Erasmo Downer, MD REFERRING PROVIDER: Erasmo Downer, MD  END OF SESSION:  PT End of Session - 10/05/23 1148     Visit Number 72    Number of Visits 74    Date for PT Re-Evaluation 10/05/23    Authorization Type [employer] PPO    Authorization Time Period 06/10/23-08/05/23    Progress Note Due on Visit 70    PT Start Time 1146    PT Stop Time 1228    PT Time Calculation (min) 42 min    Equipment Utilized During Treatment Gait belt    Activity Tolerance Patient tolerated treatment well;Patient limited by fatigue;Patient limited by pain    Behavior During Therapy [facility] for tasks assessed/performed                  Past Medical History:  Diagnosis Date   Anxiety    Breast cancer (HCC) 03/2018   right breast cancer at 11:00 and 1:00   Bursitis    bilateral hips and knees   Complication of anesthesia    Depression    Family history of breast cancer 03/24/2018   Family history of lung cancer 03/24/2018   History of alcohol dependence (HCC) 2007   no ETOH; resolved since 2007   History of hiatal hernia    History of psychosis 2014   due to sleep disturbance   Hypertension    Parkinson disease (HCC)    Personal history of radiation therapy 06/2018-07/2018   right breast ca   PONV (postoperative nausea and vomiting)    Past Surgical History:  Procedure Laterality Date   BREAST BIOPSY Right 03/14/2018   11:00 DCIS and invasive ductal carcinoma   BREAST BIOPSY Right 03/14/2018   1:00 Invasive ductal carcinoma   BREAST LUMPECTOMY Right 04/27/2018   lumpectomy of 11 and 1:00 cancers, clear margins, negative LN   BREAST LUMPECTOMY WITH RADIOACTIVE SEED AND SENTINEL LYMPH NODE BIOPSY Right 04/27/2018   Procedure: RIGHT BREAST LUMPECTOMY WITH RADIOACTIVE SEED X 2 AND RIGHT SENTINEL LYMPH NODE  BIOPSY;  Surgeon: Glenna Fellows, MD;  Location: [facility];  Service: General;  Laterality: Right;   CATARACT EXTRACTION W/PHACO Left 08/30/2019   Procedure: CATARACT EXTRACTION PHACO AND INTRAOCULAR LENS PLACEMENT (IOC) LEFT panoptix toric  01:20.5  12.7%  10.26;  Surgeon: Lockie Mola, MD;  Location: [facility];  Service: Ophthalmology;  Laterality: Left;   CATARACT EXTRACTION W/PHACO Right 09/20/2019   Procedure: CATARACT EXTRACTION PHACO AND INTRAOCULAR LENS PLACEMENT (IOC) RIGHT 5.71  00:36.4  15.7%;  Surgeon: Lockie Mola, MD;  Location: [facility];  Service: Ophthalmology;  Laterality: Right;   LAPAROTOMY N/A 02/10/2018   Procedure: EXPLORATORY LAPAROTOMY;  Surgeon: Shonna Chock, MD;  Location: [facility];  Service: Gynecology;  Laterality: N/A;   MASS EXCISION  01/2018   abdominal   OVARIAN CYST REMOVAL     SALPINGOOPHORECTOMY Bilateral 02/10/2018   Procedure: BILATERAL SALPINGO OOPHORECTOMY; PERITONEAL WASHINGS;  Surgeon: Shonna Chock, MD;  Location: [facility];  Service: Gynecology;  Laterality: Bilateral;   TONSILLECTOMY     TONSILLECTOMY AND ADENOIDECTOMY  1953   Patient Active Problem List   Diagnosis Date Noted   Tardive dyskinesia 09/09/2023   Snoring 09/09/2023   Neurogenic bladder 05/14/2023   Prediabetes 12/15/2022   Chronic cough 08/17/2022   History of  allergy to shellfish 02/24/2022   Chronic low back pain of over 3 months duration 02/04/2022   Osteoarthritis of  AC (acromioclavicular) joint (Left) 02/04/2022   Osteoarthritis of hip (Right) 02/04/2022   Impaction fracture of distal end of radius and ulna, sequela (Left) 02/04/2022   Osteoarthritis of 1st (thumb) carpometacarpal (CMC) joint of hand (Left) 02/04/2022   Lumbosacral facet arthropathy (Multilevel) (Bilateral) (T12-S1) 02/04/2022   Lumbar central spinal stenosis w/o neurogenic claudication (L2-3, L3-4, L4-5) 02/04/2022   Lumbar foraminal stenosis  (Bilateral: L2-3, L4-5) (Right: (Severe) L4-5) 02/04/2022   Lumbar lateral recess stenosis (Bilateral: L2-3, L3-4, and L4-5) (Severe) 02/04/2022   DDD (degenerative disc disease), lumbar 02/04/2022   Lumbosacral facet syndrome (Multilevel) (Bilateral) 02/04/2022   Osteoarthritis of lumbar spine 02/04/2022   Allergic conjunctivitis of both eyes 12/23/2021   Chronic hip pain (3ry area of Pain) (Bilateral) (L>R) 12/22/2021   Chronic lower extremity pain (2ry area of Pain) (Bilateral) (L>R) 12/22/2021   Abnormal MRI, lumbar spine (01/31/2020) 12/22/2021   Grade 1 Anterolisthesis of lumbar spine (L3/L4) & (5 mm) (L4/L5) 12/22/2021   Chronic pain syndrome 11/17/2021   High risk medication use 11/17/2021   Disorder of skeletal system 11/17/2021   Problems influencing health status 11/17/2021   Other hyperlipidemia 10/21/2021   Polypharmacy 08/12/2021   Chronic low back pain (1ry area of Pain) (Bilateral) (L>R) w/o sciatica 06/18/2021   Osteopenia of neck of femur (Right) 09/06/2020   Obesity 08/29/2020   Parkinson disease (HCC) 02/22/2020   Mucinous cystadenoma 12/24/2019   Bipolar disorder, in full remission, most recent episode depressed (HCC) 11/08/2019   Vitamin D insufficiency 08/24/2019   Insomnia due to mental condition 05/31/2019   Bipolar I disorder, most recent episode depressed (HCC) 05/31/2019   Alcohol use disorder, moderate, in sustained remission (HCC) 05/31/2019   Elevated tumor markers 05/27/2019   GAD (generalized anxiety disorder) 04/10/2019   MDD (major depressive disorder), severe (HCC) 01/31/2019   High serum carbohydrate antigen 19-9 (CA19-9) 12/01/2018   Adjustment disorder with anxious mood 11/24/2018   Essential hypertension 11/24/2018   B12 deficiency 10/17/2018   Incisional hernia, without obstruction or gangrene 09/12/2018   Genetic testing 04/07/2018   Family history of breast cancer 03/24/2018   Family history of lung cancer 03/24/2018   Malignant neoplasm  of upper-outer quadrant of right breast in female, estrogen receptor positive (HCC) 03/17/2018   Malignant neoplasm of upper-inner quadrant of right breast in female, estrogen receptor positive (HCC) 03/17/2018   History of psychosis 02/07/2018   History of insomnia 02/07/2018    ONSET DATE: Pt diagnosed with PD in 2019  REFERRING DIAG:  G20 (ICD-10-CM) - Parkinson's disease      THERAPY DIAG:  Muscle weakness (generalized)  Difficulty in walking, not elsewhere classified  Unsteadiness on feet  Pain in left hip  Rationale for Evaluation and Treatment: Rehabilitation  SUBJECTIVE:   Pt reports hip pain on and off since last seen. She reports no stumbles/falls.   PERTINENT HISTORY:  Pt is a pleasant 77 yo female who was diagnosed with PD in 2019. She is known to clinic and [facility], and was last seen for LSVT BIG Sept-Oct 2022. Pt with chronic pain and has become increasingly sedentary as a result exacerbating mobility difficulties due to PD. Current concerns: Pt reports L and R hip pain and back pain. Pt was being seen by pain management clinic. Pt has had injections that provided relief at the time. Pt's spouse reports her physician determined surgery was not  an option for her pain so the plan is to manage it. Pt spouse thinks pain has resulted in reluctance for pt to move. Pt currently sedentary, doesn't walk much, but when she does she holds on to furniture for support due to balance problems. When pt first stands she must hold on to something due to mix of pain and/or dizziness (described as lightheadedness). Her pain can go from 3/10 to 9/10. It does improve once she starts moving. Pt has increased difficulty with multiple activities including: getting in and out of her bed, getting in and out of her car, freezing, decreased gait speed, decreased balance and cognition, and difficulty using BUE due to tremors.  Per chart other PMH includes hx of breast cancer, HTN, MCI, insomnia,  cataracts, bipolar 1, alcohol use disorder, GAD, MDD, osteopenia of neck of R femur, chronic B low back pain with radiating pain to buttock and posterior thighs and calves. Pt with multiple comorbidities, please refer to chart for full details    PAIN: from eval Are you having pain? Yes 7/10 Left hip   PRECAUTIONS: Fall  WEIGHT BEARING RESTRICTIONS: No  FALLS: Has patient fallen in last 6 months? Yes. Number of falls 1x, leg tangled in chair leg when getting up from a chair  PLOF: Independent with basic ADLs  PATIENT GOALS: improve mobility  OBJECTIVE   TODAY'S TREATMENT:                                                                                                                                         DATE: 10/05/23   Gait belt donned throughout for pt safety  TE: Warm-up: LAQ 2x10 each LE with 1 sec hold/rep  March 2x10 each LE with 1 sec hold/rep STS 8x - limited due to L hip pain  Gait for strength/endurance with 4WW x150 ft -pt reports increased LLE pain when she tries to stand up straighter, more comfortable when in slightly forward flexed/crouched posture  Rest break  Gait for strength/endurance with 4WW x150 ft  Rest break Gait for strength/endurance with 4WW x150 ft  Comments: limited due to fatigue  STS 10x - rates medium   NMR: Large amplitude movement training: Seated cross body twist and reach 10x each way - cuing for big open hand Reach FWD>down>up>out bilat shoulder abduction 10x with 3 sec holds - feels stretch in left hamstring  Seated big lateral lean with arms out to sides 2x10 each side  Seated big twist with bilat shoulder abduction x 8 reps - limited due to L side LBP Seated LTL step and reach 2x10 each LE - medium Seated FWD step and reach with alt UE reach 2x10     PATIENT EDUCATION:  Education details: Pt educated throughout session about proper posture and technique with exercises. Improved exercise technique, movement at target joints,  use of target muscles after min to mod verbal, visual,  tactile cues.   Person educated: Patient Education method: Explanation, Demonstration, Tactile cues, and Verbal cues Education comprehension: verbalized understanding, returned demonstration, verbal cues required, tactile cues required, and needs further education    HOME EXERCISE PROGRAM:   8//20/24: Access Code: [email or web address] URL: [email or web address] Date: 06/22/2023 Prepared by: Temple Pacini  Exercises - Sit to Stand with Counter Support  - 1 x daily - 4-6 x weekly - 2 sets - 10 reps - Seated Long Arc Quad  - 1 x daily - 4-6 x weekly - 2 sets - 12 reps - Seated Heel Raise  - 1 x daily - 4-6 x weekly - 1 sets - 20 reps    GOALS: Goals reviewed with patient? Yes  SHORT TERM GOALS: Target date: 04/27/2023    Patient will be independent in home exercise program to improve strength/mobility for better functional independence with ADLs. Baseline: LSVT BIG protocol to be initiated next session; 12/18: not yet fully indep, reports she still gets confused about the technique; 12/03/2022: HEP to be updated; Pt reports practicing HEP 2x/week; 02/23/23: not currently performing HEP, reviewed in session and provided new copy; 03/16/2023: pt reports performing HEP every day; 03/30/23: Pt reports she is not being consistent with her HEP; 7/1: pt reports she is not doing much at home; 06/10/23: pt unsure how often she is exercising at home; 10/8: pt repots unsure about her HEP Goal status: MET (previously MET)   LONG TERM GOALS: Target date: 06/08/2023     Patient will increase FOTO score to equal to or greater than 49 to demonstrate statistically significant improvement in mobility and quality of life.  Baseline: 40; 12/18: deferred due to time; 45; 3/19: deferred to next visit; 01/21/2023; 02/23/2023: 46 unchanged from previous assessment); 03/17/2023: 48; 06/10/23: 52 Goal status: MET  2.  Patient (> 71 years old) will complete five  times sit to stand test in < 15 seconds indicating decreased fall risk.  Baseline: 27 sec; 12/18 23 sec hands-free; 11/04/22: 24 sec hands free; 2/1/124: 22.7 sec use of BUE, 21.3 sec hands-free; 01/19/23: 18 seconds hands-free; 02/23/23: 19 seconds hands-free; 03/16/2023: 18 seconds hands-free; 03/30/23: 17.3 seconds hands-free; 05/03/23: 17.3 seconds hands-free; 06/10/23: 19 seconds hands-free; 10/8: 16 seconds hands-free; 09/21/23: 17 sec hands-free  Goal status: PARTIALLY MET  3.  Patient will increase 10 meter walk test to >1.21m/s with WFL B step-length, upright posture for >90% of test to improve ease with gait and to override hypokentic and bradykinetic movement.  Baseline: 0.58 m/s with 5WU; 12/18: 0.7 m/s with 9WJ; 11/04/22: 0.69 m/s with 1BJ;  12/03/22: 0.73 m/s with 4NW; 01/19/2023: 0.77 m/s with 2NF; 02/23/2023: 0.83 m/s with 6OZ; 03/16/2023: 0.83 m/s with 3YQ , pt still somewhat crouched, requires cues for upright posture >50% of time; 03/30/23: 0.83 m/s with crouched posture; 05/03/23 0.8 m/s with RW, some improvement with upright posture; 06/10/23:  0.77 m/s with 6VH and crouched posture; 10/8: 0.74 m/s 4WW; 09/21/23: 0.71 m/s with 4WW Goal status: IN PROGRESS  4.  Patient will reduce time to don jacket by at least 5 seconds in order to increase ease with dressing and override bradykinetic movement Baseline: 17 seconds; 12/18: 12 seconds Goal status: MET  5.  Pt will demo or report ability to open standard jar using only 1-2 attempts in order to exhibit improved functional mobility for ADLs.  Baseline: 5.7 seconds with multiple attempts to turn jar lid to open; 12/18: 2.8 sec opens in 2 turns Goal status: MET  6.  Patient will increase Berg Balance score by > 6 points to demonstrate decreased fall risk during functional activities. Baseline: to be tested next 1-2 visits; 12/03/2022: 44/56; 01/21/23 45/56; 48/56; 03/17/2023: 48/56; 05/03/23: 45/40; 8/8: 48/56; 08/10/2023: deferred; 11/19: 51/56  Goal status: GOAL  MET  7.  Patient will report at least 4 days in a row with minimal to no L hip pain in order to improve QOL and ease/safety with functional mobility. Baseline: Pt reports pain majority of days in L hip; 12/03/2022: pt reports having hip pain about every day; 3/19: rates hip pain daily (being seen now by MD, new imaging); 02/23/2023: pt went for period of time without hip pain (she thinks a week), but reports it has now come back,; 5/28: pt reports hip pain "every other day"; 05/03/23: pt reports pain in hip every day in past week Goal status: NOT MET/discontinued   8. Patient will increase six minute walk test distance to >1000 for progression to community ambulator and improve gait ability Baseline:02/23/2023: 722 ft with 4WW and two standing rest breaks, rates test as hard; 03/16/2023: deferred to next visit due to time; 5/28/20204: 641 ft with 4WW, pt requests to end test early due to fatigue at 5 min 35 sec; 05/03/23: 645 ft with 4WW and able to complete full test; 06/10/23: 708 ft; 08/10/23: 632 ft with 3 rest breaks; 11/19: 592 ft with 4WW, pt requests to end test early at about 5 min due to fatigue  Goal status: IN PROGRESS   ASSESSMENT:  Pt continues to be limited with gait due to fatigue, and therex due to L hip pain. Modifications made to interventions throughout to reduce pain and improve comfort, provide rest breaks.  Pt with slight decrease overall in gait performance, but does report she feels she has improved her gait ability. The pt will benefit from further skilled PT to improve strength, balance, gait and mobility for improved quality of life and decreased risk of falling.   OBJECTIVE IMPAIRMENTS: Abnormal gait, decreased balance, decreased cognition, decreased coordination, decreased endurance, decreased knowledge of use of DME, decreased mobility, difficulty walking, decreased strength, dizziness, hypomobility, impaired UE functional use, improper body mechanics, postural dysfunction, and pain.    ACTIVITY LIMITATIONS: lifting, bending, standing, squatting, stairs, transfers, bed mobility, bathing, dressing, hygiene/grooming, and locomotion level  PARTICIPATION LIMITATIONS: meal prep, cleaning, laundry, medication management, personal finances, driving, shopping, community activity, and yard work  PERSONAL FACTORS: Age, Fitness, Sex, Time since onset of injury/illness/exacerbation, and 3+ comorbidities: PMH includes hx of breast cancer, HTN, MCI, insomnia, cataracts, bipolar 1, alcohol use disorder, GAD, MDD, osteopenia of neck of R femur, chronic B low back pain with radiating pain to buttock and posterior thighs and calves. Pt with multiple comorbidities, please refer to chart for full details  are also affecting patient's functional outcome.   REHAB POTENTIAL: Fair    CLINICAL DECISION MAKING: Evolving/moderate complexity  EVALUATION COMPLEXITY: High  PLAN:  PT FREQUENCY: 1-2x/week  PT DURATION: 8 weeks  PLANNED INTERVENTIONS: Therapeutic exercises, Therapeutic activity, Neuromuscular re-education, Balance training, Gait training, Patient/Family education, Self Care, Joint mobilization, Stair training, Vestibular training, Canalith repositioning, Visual/preceptual remediation/compensation, Orthotic/Fit training, DME instructions, Wheelchair mobility training, Spinal mobilization, Cryotherapy, Moist heat, Splintting, Taping, and Manual therapy  PLAN FOR NEXT SESSION: endurance, higher level balance in // bars, strength   Temple Pacini PT, DPT  Physical Therapist - [facility]  [facility]  10/05/2023, 1:04 PM

## 2023-10-12 ENCOUNTER — Ambulatory Visit: Payer: Medicare PPO

## 2023-10-12 ENCOUNTER — Other Ambulatory Visit: Payer: Self-pay | Admitting: Internal Medicine

## 2023-10-12 DIAGNOSIS — M25552 Pain in left hip: Secondary | ICD-10-CM

## 2023-10-12 DIAGNOSIS — M6281 Muscle weakness (generalized): Secondary | ICD-10-CM | POA: Diagnosis not present

## 2023-10-12 DIAGNOSIS — R2681 Unsteadiness on feet: Secondary | ICD-10-CM | POA: Diagnosis not present

## 2023-10-12 DIAGNOSIS — R278 Other lack of coordination: Secondary | ICD-10-CM | POA: Diagnosis not present

## 2023-10-12 DIAGNOSIS — M5459 Other low back pain: Secondary | ICD-10-CM | POA: Diagnosis not present

## 2023-10-12 DIAGNOSIS — R262 Difficulty in walking, not elsewhere classified: Secondary | ICD-10-CM

## 2023-10-12 DIAGNOSIS — C50411 Malignant neoplasm of upper-outer quadrant of right female breast: Secondary | ICD-10-CM

## 2023-10-12 NOTE — Therapy (Signed)
OUTPATIENT PHYSICAL THERAPY TREATMENT/RECERT       Patient Name: Kendra [name] MRN: [id number] DOB:[birth year], 77 y.o., female Today's Date: 10/12/2023  PCP: Erasmo Downer, MD REFERRING PROVIDER: Erasmo Downer, MD  END OF SESSION:  PT End of Session - 10/12/23 1152     Visit Number 73    Number of Visits 80    Date for PT Re-Evaluation 11/23/23    Authorization Type [employer] PPO    Authorization Time Period 06/10/23-08/05/23    Progress Note Due on Visit 70    PT Start Time 1150    PT Stop Time 1229    PT Time Calculation (min) 39 min    Equipment Utilized During Treatment Gait belt    Activity Tolerance Patient tolerated treatment well;Patient limited by fatigue;Patient limited by pain    Behavior During Therapy [facility] for tasks assessed/performed                  Past Medical History:  Diagnosis Date   Anxiety    Breast cancer (HCC) 03/2018   right breast cancer at 11:00 and 1:00   Bursitis    bilateral hips and knees   Complication of anesthesia    Depression    Family history of breast cancer 03/24/2018   Family history of lung cancer 03/24/2018   History of alcohol dependence (HCC) 2007   no ETOH; resolved since 2007   History of hiatal hernia    History of psychosis 2014   due to sleep disturbance   Hypertension    Parkinson disease (HCC)    Personal history of radiation therapy 06/2018-07/2018   right breast ca   PONV (postoperative nausea and vomiting)    Past Surgical History:  Procedure Laterality Date   BREAST BIOPSY Right 03/14/2018   11:00 DCIS and invasive ductal carcinoma   BREAST BIOPSY Right 03/14/2018   1:00 Invasive ductal carcinoma   BREAST LUMPECTOMY Right 04/27/2018   lumpectomy of 11 and 1:00 cancers, clear margins, negative LN   BREAST LUMPECTOMY WITH RADIOACTIVE SEED AND SENTINEL LYMPH NODE BIOPSY Right 04/27/2018   Procedure: RIGHT BREAST LUMPECTOMY WITH RADIOACTIVE SEED X 2 AND RIGHT SENTINEL LYMPH  NODE BIOPSY;  Surgeon: Glenna Fellows, MD;  Location: [facility];  Service: General;  Laterality: Right;   CATARACT EXTRACTION W/PHACO Left 08/30/2019   Procedure: CATARACT EXTRACTION PHACO AND INTRAOCULAR LENS PLACEMENT (IOC) LEFT panoptix toric  01:20.5  12.7%  10.26;  Surgeon: Lockie Mola, MD;  Location: [facility];  Service: Ophthalmology;  Laterality: Left;   CATARACT EXTRACTION W/PHACO Right 09/20/2019   Procedure: CATARACT EXTRACTION PHACO AND INTRAOCULAR LENS PLACEMENT (IOC) RIGHT 5.71  00:36.4  15.7%;  Surgeon: Lockie Mola, MD;  Location: [facility];  Service: Ophthalmology;  Laterality: Right;   LAPAROTOMY N/A 02/10/2018   Procedure: EXPLORATORY LAPAROTOMY;  Surgeon: Shonna Chock, MD;  Location: [facility];  Service: Gynecology;  Laterality: N/A;   MASS EXCISION  01/2018   abdominal   OVARIAN CYST REMOVAL     SALPINGOOPHORECTOMY Bilateral 02/10/2018   Procedure: BILATERAL SALPINGO OOPHORECTOMY; PERITONEAL WASHINGS;  Surgeon: Shonna Chock, MD;  Location: [facility];  Service: Gynecology;  Laterality: Bilateral;   TONSILLECTOMY     TONSILLECTOMY AND ADENOIDECTOMY  1953   Patient Active Problem List   Diagnosis Date Noted   Tardive dyskinesia 09/09/2023   Snoring 09/09/2023   Neurogenic bladder 05/14/2023   Prediabetes 12/15/2022   Chronic cough 08/17/2022   History of  allergy to shellfish 02/24/2022   Chronic low back pain of over 3 months duration 02/04/2022   Osteoarthritis of  AC (acromioclavicular) joint (Left) 02/04/2022   Osteoarthritis of hip (Right) 02/04/2022   Impaction fracture of distal end of radius and ulna, sequela (Left) 02/04/2022   Osteoarthritis of 1st (thumb) carpometacarpal (CMC) joint of hand (Left) 02/04/2022   Lumbosacral facet arthropathy (Multilevel) (Bilateral) (T12-S1) 02/04/2022   Lumbar central spinal stenosis w/o neurogenic claudication (L2-3, L3-4, L4-5) 02/04/2022   Lumbar foraminal stenosis  (Bilateral: L2-3, L4-5) (Right: (Severe) L4-5) 02/04/2022   Lumbar lateral recess stenosis (Bilateral: L2-3, L3-4, and L4-5) (Severe) 02/04/2022   DDD (degenerative disc disease), lumbar 02/04/2022   Lumbosacral facet syndrome (Multilevel) (Bilateral) 02/04/2022   Osteoarthritis of lumbar spine 02/04/2022   Allergic conjunctivitis of both eyes 12/23/2021   Chronic hip pain (3ry area of Pain) (Bilateral) (L>R) 12/22/2021   Chronic lower extremity pain (2ry area of Pain) (Bilateral) (L>R) 12/22/2021   Abnormal MRI, lumbar spine (01/31/2020) 12/22/2021   Grade 1 Anterolisthesis of lumbar spine (L3/L4) & (5 mm) (L4/L5) 12/22/2021   Chronic pain syndrome 11/17/2021   High risk medication use 11/17/2021   Disorder of skeletal system 11/17/2021   Problems influencing health status 11/17/2021   Other hyperlipidemia 10/21/2021   Polypharmacy 08/12/2021   Chronic low back pain (1ry area of Pain) (Bilateral) (L>R) w/o sciatica 06/18/2021   Osteopenia of neck of femur (Right) 09/06/2020   Obesity 08/29/2020   Parkinson disease (HCC) 02/22/2020   Mucinous cystadenoma 12/24/2019   Bipolar disorder, in full remission, most recent episode depressed (HCC) 11/08/2019   Vitamin D insufficiency 08/24/2019   Insomnia due to mental condition 05/31/2019   Bipolar I disorder, most recent episode depressed (HCC) 05/31/2019   Alcohol use disorder, moderate, in sustained remission (HCC) 05/31/2019   Elevated tumor markers 05/27/2019   GAD (generalized anxiety disorder) 04/10/2019   MDD (major depressive disorder), severe (HCC) 01/31/2019   High serum carbohydrate antigen 19-9 (CA19-9) 12/01/2018   Adjustment disorder with anxious mood 11/24/2018   Essential hypertension 11/24/2018   B12 deficiency 10/17/2018   Incisional hernia, without obstruction or gangrene 09/12/2018   Genetic testing 04/07/2018   Family history of breast cancer 03/24/2018   Family history of lung cancer 03/24/2018   Malignant neoplasm  of upper-outer quadrant of right breast in female, estrogen receptor positive (HCC) 03/17/2018   Malignant neoplasm of upper-inner quadrant of right breast in female, estrogen receptor positive (HCC) 03/17/2018   History of psychosis 02/07/2018   History of insomnia 02/07/2018    ONSET DATE: Pt diagnosed with PD in 2019  REFERRING DIAG:  G20 (ICD-10-CM) - Parkinson's disease      THERAPY DIAG:  Difficulty in walking, not elsewhere classified  Muscle weakness (generalized)  Unsteadiness on feet  Pain in left hip  Rationale for Evaluation and Treatment: Rehabilitation  SUBJECTIVE:   Pt reports current L hip pain, pain is high at the moment, she also reports low back pain. She reports no stumble/falls. She reports she has not been able to do her exercises outside of PT and she is unsure why. Pt reports she is not interested in community classes.  PERTINENT HISTORY:  Pt is a pleasant 77 yo female who was diagnosed with PD in 2019. She is known to clinic and [facility], and was last seen for LSVT BIG Sept-Oct 2022. Pt with chronic pain and has become increasingly sedentary as a result exacerbating mobility difficulties due to PD. Current concerns: Pt reports  L and R hip pain and back pain. Pt was being seen by pain management clinic. Pt has had injections that provided relief at the time. Pt's spouse reports her physician determined surgery was not an option for her pain so the plan is to manage it. Pt spouse thinks pain has resulted in reluctance for pt to move. Pt currently sedentary, doesn't walk much, but when she does she holds on to furniture for support due to balance problems. When pt first stands she must hold on to something due to mix of pain and/or dizziness (described as lightheadedness). Her pain can go from 3/10 to 9/10. It does improve once she starts moving. Pt has increased difficulty with multiple activities including: getting in and out of her bed, getting in and out of her  car, freezing, decreased gait speed, decreased balance and cognition, and difficulty using BUE due to tremors.  Per chart other PMH includes hx of breast cancer, HTN, MCI, insomnia, cataracts, bipolar 1, alcohol use disorder, GAD, MDD, osteopenia of neck of R femur, chronic B low back pain with radiating pain to buttock and posterior thighs and calves. Pt with multiple comorbidities, please refer to chart for full details    PAIN: from eval Are you having pain? Yes 7/10 Left hip   PRECAUTIONS: Fall  WEIGHT BEARING RESTRICTIONS: No  FALLS: Has patient fallen in last 6 months? Yes. Number of falls 1x, leg tangled in chair leg when getting up from a chair  PLOF: Independent with basic ADLs  PATIENT GOALS: improve mobility  OBJECTIVE   TODAY'S TREATMENT:                                                                                                                                         DATE: 10/12/23   Gait belt donned throughout for pt safety   TA: Goal reassessment completed for recert  PT provided education/reviewed need to perform HEP outside of PT to see progress/make gains.   Pt frequently requests rest breaks throughout testing, rest breaks taken. PT provides encouragement for continued activity throughout.   TE: other interventions completed  STS 10x, 6x, 4x, 2x, 1x hands-free, rates hard   LAQ 2x15 each LE with 1 sec hold/rep    PATIENT EDUCATION:  Education details: Pt educated throughout session about proper posture and technique with exercises. Improved exercise technique, movement at target joints, use of target muscles after min to mod verbal, visual, tactile cues. Goals, plan  Person educated: Patient Education method: Explanation, Demonstration, Tactile cues, and Verbal cues Education comprehension: verbalized understanding, returned demonstration, verbal cues required, tactile cues required, and needs further education    HOME EXERCISE PROGRAM:    8//20/24: Access Code: [email or web address] URL: [email or web address] Date: 06/22/2023 Prepared by: Temple Pacini  Exercises - Sit to Stand with Counter Support  - 1 x daily - 4-6 x weekly - 2 sets - 10 reps -  Seated Long Arc Quad  - 1 x daily - 4-6 x weekly - 2 sets - 12 reps - Seated Heel Raise  - 1 x daily - 4-6 x weekly - 1 sets - 20 reps    GOALS: Goals reviewed with patient? Yes  SHORT TERM GOALS: Target date: 04/27/2023    Patient will be independent in home exercise program to improve strength/mobility for better functional independence with ADLs. Baseline: LSVT BIG protocol to be initiated next session; 12/18: not yet fully indep, reports she still gets confused about the technique; 12/03/2022: HEP to be updated; Pt reports practicing HEP 2x/week; 02/23/23: not currently performing HEP, reviewed in session and provided new copy; 03/16/2023: pt reports performing HEP every day; 03/30/23: Pt reports she is not being consistent with her HEP; 7/1: pt reports she is not doing much at home; 06/10/23: pt unsure how often she is exercising at home; 10/8: pt repots unsure about her HEP Goal status: MET (previously MET)   LONG TERM GOALS: Target date: 06/08/2023     Patient will increase FOTO score to equal to or greater than 49 to demonstrate statistically significant improvement in mobility and quality of life.  Baseline: 40; 12/18: deferred due to time; 45; 3/19: deferred to next visit; 01/21/2023; 02/23/2023: 46 unchanged from previous assessment); 03/17/2023: 48; 06/10/23: 52 Goal status: MET  2.  Patient (> 86 years old) will complete five times sit to stand test in < 15 seconds indicating decreased fall risk.  Baseline: 27 sec; 12/18 23 sec hands-free; 11/04/22: 24 sec hands free; 2/1/124: 22.7 sec use of BUE, 21.3 sec hands-free; 01/19/23: 18 seconds hands-free; 02/23/23: 19 seconds hands-free; 03/16/2023: 18 seconds hands-free; 03/30/23: 17.3 seconds hands-free; 05/03/23: 17.3 seconds  hands-free; 06/10/23: 19 seconds hands-free; 10/8: 16 seconds hands-free; 09/21/23: 17 sec hands-free; 10/12/23: 20 sec hands-free Goal status: PARTIALLY MET  3.  Patient will increase 10 meter walk test to >1.71m/s with WFL B step-length, upright posture for >90% of test to improve ease with gait and to override hypokentic and bradykinetic movement.  Baseline: 0.58 m/s with 5HQ; 12/18: 0.7 m/s with 4ON; 11/04/22: 0.69 m/s with 6EX;  12/03/22: 0.73 m/s with 5MW; 01/19/2023: 0.77 m/s with 4XL; 02/23/2023: 0.83 m/s with 2GM; 03/16/2023: 0.83 m/s with 0NU , pt still somewhat crouched, requires cues for upright posture >50% of time; 03/30/23: 0.83 m/s with crouched posture; 05/03/23 0.8 m/s with RW, some improvement with upright posture; 06/10/23:  0.77 m/s with 2VO and crouched posture; 10/8: 0.74 m/s 4WW; 09/21/23: 0.71 m/s with 5DG; 12/10:  0.57 m/s with 4WW Goal status: IN PROGRESS  4.  Patient will reduce time to don jacket by at least 5 seconds in order to increase ease with dressing and override bradykinetic movement Baseline: 17 seconds; 12/18: 12 seconds Goal status: MET  5.  Pt will demo or report ability to open standard jar using only 1-2 attempts in order to exhibit improved functional mobility for ADLs.  Baseline: 5.7 seconds with multiple attempts to turn jar lid to open; 12/18: 2.8 sec opens in 2 turns Goal status: MET  6.  Patient will increase Berg Balance score by > 6 points to demonstrate decreased fall risk during functional activities. Baseline: to be tested next 1-2 visits; 12/03/2022: 44/56; 01/21/23 45/56; 48/56; 03/17/2023: 48/56; 05/03/23: 64/40; 8/8: 48/56; 08/10/2023: deferred; 11/19: 51/56  Goal status: GOAL MET  7.  Patient will report at least 4 days in a row with minimal to no L hip pain in order  to improve QOL and ease/safety with functional mobility. Baseline: Pt reports pain majority of days in L hip; 12/03/2022: pt reports having hip pain about every day; 3/19: rates hip pain daily  (being seen now by MD, new imaging); 02/23/2023: pt went for period of time without hip pain (she thinks a week), but reports it has now come back,; 5/28: pt reports hip pain "every other day"; 05/03/23: pt reports pain in hip every day in past week Goal status: NOT MET/discontinued   8. Patient will increase six minute walk test distance to >1000 for progression to community ambulator and improve gait ability Baseline:02/23/2023: 722 ft with 4WW and two standing rest breaks, rates test as hard; 03/16/2023: deferred to next visit due to time; 5/28/20204: 641 ft with 4WW, pt requests to end test early due to fatigue at 5 min 35 sec; 05/03/23: 645 ft with 4WW and able to complete full test; 06/10/23: 708 ft; 08/10/23: 632 ft with 3 rest breaks; 11/19: 592 ft with 4WW, pt requests to end test early at about 5 min due to fatigue; 10/21/23: Pt completes full 6 minutes with 4 standing rest breaks, ambulates total of 535 ft with 4WW  Goal status: IN PROGRESS   ASSESSMENT:  Goal reassessment completed for recert. Pt is aware this will be last recert due to insurance limitation on visits, but also pt with general decrease in performance today on reassessment with continued reports of not completing HEP or increasing activity levels outside of PT. PT reinforced education on importance of performing HEP/increasing activity levels in order to maintain progress or make more gains. I suspect pt's chronic hip pain also continues to be limiting factor for increasing activity levels outside of therapy. The pt will benefit from one more cert of skilled PT to improve balance, gait and mobility.  OBJECTIVE IMPAIRMENTS: Abnormal gait, decreased balance, decreased cognition, decreased coordination, decreased endurance, decreased knowledge of use of DME, decreased mobility, difficulty walking, decreased strength, dizziness, hypomobility, impaired UE functional use, improper body mechanics, postural dysfunction, and pain.   ACTIVITY  LIMITATIONS: lifting, bending, standing, squatting, stairs, transfers, bed mobility, bathing, dressing, hygiene/grooming, and locomotion level  PARTICIPATION LIMITATIONS: meal prep, cleaning, laundry, medication management, personal finances, driving, shopping, community activity, and yard work  PERSONAL FACTORS: Age, Fitness, Sex, Time since onset of injury/illness/exacerbation, and 3+ comorbidities: PMH includes hx of breast cancer, HTN, MCI, insomnia, cataracts, bipolar 1, alcohol use disorder, GAD, MDD, osteopenia of neck of R femur, chronic B low back pain with radiating pain to buttock and posterior thighs and calves. Pt with multiple comorbidities, please refer to chart for full details  are also affecting patient's functional outcome.   REHAB POTENTIAL: Fair    CLINICAL DECISION MAKING: Evolving/moderate complexity  EVALUATION COMPLEXITY: High  PLAN:  PT FREQUENCY: 1-2x/week  PT DURATION: 6 weeks     PLANNED INTERVENTIONS: Therapeutic exercises, Therapeutic activity, Neuromuscular re-education, Balance training, Gait training, Patient/Family education, Self Care, Joint mobilization, Stair training, Vestibular training, Canalith repositioning, Visual/preceptual remediation/compensation, Orthotic/Fit training, DME instructions, Wheelchair mobility training, Spinal mobilization, Cryotherapy, Moist heat, Splintting, Taping, and Manual therapy  PLAN FOR NEXT SESSION: endurance, higher level balance in // bars, strength   Temple Pacini PT, DPT  Physical Therapist - [facility]  [facility]  10/12/2023, 5:11 PM

## 2023-10-12 NOTE — Telephone Encounter (Signed)
Assessment & Plan:    # Multi-focal stage IA right breast cancer s/p right lumpectomy on 04/27/2018.  ER + (100%), PR + (70%), and HER-2 negative.  Ki-67 was 10%.  Pathologic stage was mT2N0. Oncotype DX revealed a recurrence score of 12 which translated to a distant recurrence of 3% at 9 years with an AI or tamxifen alone.  No chemotherapy given.  S/p breast radiation from 06/30/2018 - 07/20/2018.  She began Femara on 09/06/2018.  She is tolerating it well. AUG 2024- Mammo BIL- WNL. Clinically stable and asymptomatic. Plan to complete 5 years of adjuvant AI in December 2024. Continue annual mammogram.

## 2023-10-13 ENCOUNTER — Telehealth: Payer: Self-pay | Admitting: Internal Medicine

## 2023-10-13 ENCOUNTER — Encounter: Payer: Self-pay | Admitting: *Deleted

## 2023-10-13 ENCOUNTER — Encounter: Payer: Self-pay | Admitting: Internal Medicine

## 2023-10-13 NOTE — Telephone Encounter (Signed)
Please inform patient that we will discontinue her Femara now- and will not refill.   If she had any femara at hand-she can continue/ to finish off the prescription.  Keep appointments as planned.

## 2023-10-14 ENCOUNTER — Ambulatory Visit: Payer: Medicare PPO | Admitting: Nurse Practitioner

## 2023-10-14 VITALS — BP 138/76 | HR 91 | Temp 97.6°F | Ht 66.0 in | Wt 220.0 lb

## 2023-10-14 DIAGNOSIS — G2581 Restless legs syndrome: Secondary | ICD-10-CM | POA: Diagnosis not present

## 2023-10-14 DIAGNOSIS — R413 Other amnesia: Secondary | ICD-10-CM

## 2023-10-14 DIAGNOSIS — R0683 Snoring: Secondary | ICD-10-CM | POA: Diagnosis not present

## 2023-10-14 DIAGNOSIS — G4700 Insomnia, unspecified: Secondary | ICD-10-CM

## 2023-10-14 DIAGNOSIS — G4719 Other hypersomnia: Secondary | ICD-10-CM | POA: Diagnosis not present

## 2023-10-14 NOTE — Patient Instructions (Signed)
Given your symptoms, I am concerned that you may have sleep disordered breathing with sleep apnea. You will need a sleep study for further evaluation. We are going to try to get you set up for an in lab study. Someone will contact you to schedule this. If there is an issue with insurance, we will have to do a home sleep study first   We discussed how untreated sleep apnea puts an individual at risk for cardiac arrhthymias, pulm HTN, DM, stroke and increases their risk for daytime accidents. We also briefly reviewed treatment options including weight loss, side sleeping position, oral appliance, CPAP therapy or referral to ENT for possible surgical options  Use caution when driving and pull over if you become sleepy.  Follow up in 8 weeks with Katie Shmuel Girgis,NP to go over sleep study results, or sooner, if needed

## 2023-10-14 NOTE — Progress Notes (Signed)
@Patient  ID: Kendra [name], female    DOB: 10/25/1946, 77 y.o.   MRN: [id number]  Chief Complaint  Patient presents with   Consult    Snoring and restless sleep.     Referring provider: Jomarie Longs, MD  HPI: 77 year old female, never smoker referred for sleep consult.  Past medical history significant for hypertension, tardive dyskinesia, chronic back pain, bipolar disorder, anxiety, chronic hip pain insomnia, history of right breast cancer, obesity, ILD, prediabetes, memory impairment.  TEST/EVENTS:   10/14/2023: Today-sleep consult Patient presents today for sleep consult with her husband.  She has had trouble with her sleep for many years now.  She was also recently having a workup for possible Parkinson's disease but was told she actually did not have this.  She has been having some very issues and working with neurology.  She has inconsistent sleep wakes frequently during the night.  Tends to accidentally doze off during the day.  She does feel tired during the day.  She does snore at night.  Her husband has not necessarily noticed that she stops breathing when she sleeps at night.  She denies any morning headaches, sleep parasomnia/paralysis.  She is no longer driving but had never fallen asleep while driving previously. She goes to bed around 9 PM.  Typically falls asleep within 15 minutes.  Wakes up at least at night.  Wake time varies drastically.  If her husband gets her up at night, she will usually get up around 8 AM.  Otherwise she could sleep until 10 AM or later.  She is retired.  Weight is up 60 pounds in the last 2 years according to her husband.  She is never had a previous sleep study before.  Not wear any supplemental oxygen. She does have symptoms of restless leg.  She is currently on gabapentin for neuropathy.  Has never tried anything dedicated for restless leg at night.  No sleep aids. She is a never smoker.  Does not drink any alcohol anymore.  Quit in 2006.  No  excessive caffeine intake.  Lives with her husband.  She is retired.  Family history of heart disease and cancer.  Epworth 5  Allergies  Allergen Reactions   Hydroxyzine Anaphylaxis    Tongue swollen   Other Other (See Comments)    Food poisoning    Shrimp [Shellfish Allergy] Other (See Comments)    Food poisoning     Immunization History  Administered Date(s) Administered   Fluad Quad(high Dose 65+) 07/19/2019, 08/29/2020, 08/12/2021   PNEUMOCOCCAL CONJUGATE-20 10/21/2021   Pneumococcal Polysaccharide-23 08/29/2020    Past Medical History:  Diagnosis Date   Anxiety    Breast cancer (HCC) 03/2018   right breast cancer at 11:00 and 1:00   Bursitis    bilateral hips and knees   Complication of anesthesia    Depression    Family history of breast cancer 03/24/2018   Family history of lung cancer 03/24/2018   History of alcohol dependence (HCC) 2007   no ETOH; resolved since 2007   History of hiatal hernia    History of psychosis 2014   due to sleep disturbance   Hypertension    Parkinson disease (HCC)    Personal history of radiation therapy 06/2018-07/2018   right breast ca   PONV (postoperative nausea and vomiting)     Tobacco History: Social History   Tobacco Use  Smoking Status Never  Smokeless Tobacco Never   Counseling given: Not Answered   Outpatient Medications  Prior to Visit  Medication Sig Dispense Refill   Acetaminophen 500 MG capsule Take 1,000 mg by mouth 2 (two) times daily.     AMBULATORY NON FORMULARY MEDICATION 1 Bag by Other route daily. HERO smart dispenser 1 Bag 0   cyanocobalamin (VITAMIN B12) 1000 MCG/ML injection INJECT 1 ML INTO THE MUSCLE EVERY 30 DAYS 3 mL 3   denosumab (PROLIA) 60 MG/ML SOSY injection Inject 60 mg into the skin every 6 (six) months. takes annually     FLUoxetine HCl 60 MG TABS TAKE 1 TABLET BY MOUTH DAILY 90 tablet 1   gabapentin (NEURONTIN) 100 MG capsule Take 200 mg by mouth 3 (three) times daily.      losartan-hydrochlorothiazide (HYZAAR) 50-12.5 MG tablet TAKE 1 TABLET BY MOUTH DAILY. STOP LISINOPRIL-HCTZ- THIS MEDICATION REPLACES THAT 90 tablet 0   mometasone (ELOCON) 0.1 % lotion Apply topically.     nystatin (MYCOSTATIN/NYSTOP) powder APPLY TO AFFECTED AREA THREE TIMES A DAY 60 g 0   risperiDONE (RISPERDAL) 0.5 MG tablet TAKE 1 TABLET BY MOUTH TWICE A DAY 180 tablet 1   traZODone (DESYREL) 100 MG tablet Take 1 tablet (100 mg total) by mouth at bedtime. 90 tablet 3   carbidopa-levodopa (SINEMET IR) 25-100 MG tablet Take 2 tablets by mouth with breakfast, with lunch, and with evening meal. Start weaning     Carbidopa-Levodopa ER (SINEMET CR) 25-100 MG tablet controlled release Take 2 tablets by mouth at bedtime. Weaning off     No facility-administered medications prior to visit.     Review of Systems:   Constitutional: No night sweats, fevers, chills, or lassitude. +fatigue, weight gain HEENT: No headaches, difficulty swallowing, tooth/dental problems, or sore throat. No sneezing, itching, ear ache, nasal congestion, or post nasal drip CV:  No chest pain, orthopnea, PND, swelling in lower extremities, anasarca, dizziness, palpitations, syncope Resp: +snoring; baseline shortness of breath with exertion. No excess mucus or change in color of mucus. No productive or non-productive. No hemoptysis. No wheezing.  No chest wall deformity GI:  No heartburn, indigestion, abdominal pain, nausea, vomiting, diarrhea, change in bowel habits, loss of appetite, bloody stools.  GU: No dysuria, change in color of urine, urgency or frequency.  No flank pain, no hematuria. +nocturia  Skin: No rash, lesions, ulcerations MSK:  +chronic joint pains. Neuro: No dizziness or lightheadedness. +memory impairment, restless leg  Psych: No depression or anxiety. Mood stable. +sleep disturbance     Physical Exam:  BP 138/76 (BP Location: Left Arm, Patient Position: Sitting, Cuff Size: Normal)   Pulse 91   Temp  97.6 F (36.4 C) (Temporal)   Ht 5\' 6"  (1.676 m)   Wt 220 lb (99.8 kg)   SpO2 96%   BMI 35.51 kg/m   GEN: Pleasant, interactive, well-kempt; obese; in no acute distress HEENT:  Normocephalic and atraumatic. PERRLA. Sclera white. Nasal turbinates pink, moist and patent bilaterally. No rhinorrhea present. Oropharynx pink and moist, without exudate or edema. No lesions, ulcerations, or postnasal drip. Mallampati III NECK:  Supple w/ fair ROM. No JVD present. Normal carotid impulses w/o bruits. Thyroid symmetrical with no goiter or nodules palpated. No lymphadenopathy.   CV: RRR, no m/r/g, no peripheral edema. Pulses intact, +2 bilaterally. No cyanosis, pallor or clubbing. PULMONARY:  Unlabored, regular breathing. Clear bilaterally A&P w/o wheezes/rales/rhonchi. No accessory muscle use.  GI: BS present and normoactive. Soft, non-tender to palpation. No organomegaly or masses detected.  MSK: No erythema, warmth or tenderness. Cap refil <2 sec all extrem.  No deformities or joint swelling noted.  Neuro: A/Ox3. No focal deficits noted. Periodic limb movement BLE  Skin: Warm, no lesions or rashe Psych: Normal affect and behavior. Judgement and thought content appropriate.     Lab Results:  CBC    Component Value Date/Time   WBC 8.0 08/31/2023 1244   WBC 5.3 03/01/2023 1429   RBC 4.86 08/31/2023 1244   HGB 14.1 08/31/2023 1244   HCT 42.6 08/31/2023 1244   PLT 157 08/31/2023 1244   MCV 87.7 08/31/2023 1244   MCH 29.0 08/31/2023 1244   MCHC 33.1 08/31/2023 1244   RDW 14.2 08/31/2023 1244   LYMPHSABS 0.9 08/31/2023 1244   MONOABS 0.4 08/31/2023 1244   EOSABS 0.1 08/31/2023 1244   BASOSABS 0.1 08/31/2023 1244    BMET    Component Value Date/Time   NA 133 (L) 08/31/2023 1244   NA 138 11/06/2022 1047   K 3.9 08/31/2023 1244   CL 99 08/31/2023 1244   CO2 23 08/31/2023 1244   GLUCOSE 151 (H) 08/31/2023 1244   BUN 10 08/31/2023 1244   BUN 9 11/06/2022 1047   CREATININE 0.70  08/31/2023 1244   CALCIUM 9.3 08/31/2023 1244   GFRNONAA >60 08/31/2023 1244   GFRAA >60 04/29/2020 1022   GFRAA >60 03/23/2018 1220    BNP No results found for: "BNP"   Imaging:  No results found.  denosumab (PROLIA) injection 60 mg     Date Action Dose Route User   08/31/2023 1359 Given 60 mg Subcutaneous (Left Arm) Ashley Royalty A, CMA           No data to display          No results found for: "NITRICOXIDE"      Assessment & Plan:   Excessive daytime sleepiness She has snoring, excessive daytime sleepiness, restless sleep, restless leg, nocturia. BMI 35. Epworth 5. Recent workup for memory impairment and periodic limb movement. Deemed to not have Parkinson's. Following with neurology. Given this,  I am concerned she could have sleep disordered breathing with obstructive sleep apnea. She will need sleep study for further evaluation.    - discussed how weight can impact sleep and risk for sleep disordered breathing - discussed options to assist with weight loss: combination of diet modification, cardiovascular and strength training exercises   - had an extensive discussion regarding the adverse health consequences related to untreated sleep disordered breathing - specifically discussed the risks for hypertension, coronary artery disease, cardiac dysrhythmias, cerebrovascular disease, and diabetes - lifestyle modification discussed   - discussed how sleep disruption can increase risk of accidents, particularly when driving - safe driving practices were discussed   Patient Instructions  Given your symptoms, I am concerned that you may have sleep disordered breathing with sleep apnea. You will need a sleep study for further evaluation. We are going to try to get you set up for an in lab study. Someone will contact you to schedule this. If there is an issue with insurance, we will have to do a home sleep study first   We discussed how untreated sleep apnea puts an  individual at risk for cardiac arrhthymias, pulm HTN, DM, stroke and increases their risk for daytime accidents. We also briefly reviewed treatment options including weight loss, side sleeping position, oral appliance, CPAP therapy or referral to ENT for possible surgical options  Use caution when driving and pull over if you become sleepy.  Follow up in 8 weeks with Katie Haddon Fyfe,NP to  go over sleep study results, or sooner, if needed     Insomnia Likely multifactorial; possibly related to untreated OSA. See above. Sleep hygiene reviewed.   Restless leg syndrome Rule out sleep disordered breathing. If negative or symptoms persist, obtain iron panel.   Memory impairment Follow up with neurology as scheduled   Advised if symptoms do not improve or worsen, to please contact office for sooner follow up or seek emergency care.   I spent 45 minutes of dedicated to the care of this patient on the date of this encounter to include pre-visit review of records, face-to-face time with the patient discussing conditions above, post visit ordering of testing, clinical documentation with the electronic health record, making appropriate referrals as documented, and communicating necessary findings to members of the patients care team.  Noemi Chapel, NP 10/16/2023  Pt aware and understands NP's role.

## 2023-10-16 ENCOUNTER — Encounter: Payer: Self-pay | Admitting: Nurse Practitioner

## 2023-10-16 DIAGNOSIS — G2581 Restless legs syndrome: Secondary | ICD-10-CM | POA: Insufficient documentation

## 2023-10-16 DIAGNOSIS — R413 Other amnesia: Secondary | ICD-10-CM | POA: Insufficient documentation

## 2023-10-16 DIAGNOSIS — G4719 Other hypersomnia: Secondary | ICD-10-CM | POA: Insufficient documentation

## 2023-10-16 NOTE — Assessment & Plan Note (Signed)
Follow up with neurology as scheduled.

## 2023-10-16 NOTE — Assessment & Plan Note (Signed)
She has snoring, excessive daytime sleepiness, restless sleep, restless leg, nocturia. BMI 35. Epworth 5. Recent workup for memory impairment and periodic limb movement. Deemed to not have Parkinson's. Following with neurology. Given this,  I am concerned she could have sleep disordered breathing with obstructive sleep apnea. She will need sleep study for further evaluation.    - discussed how weight can impact sleep and risk for sleep disordered breathing - discussed options to assist with weight loss: combination of diet modification, cardiovascular and strength training exercises   - had an extensive discussion regarding the adverse health consequences related to untreated sleep disordered breathing - specifically discussed the risks for hypertension, coronary artery disease, cardiac dysrhythmias, cerebrovascular disease, and diabetes - lifestyle modification discussed   - discussed how sleep disruption can increase risk of accidents, particularly when driving - safe driving practices were discussed   Patient Instructions  Given your symptoms, I am concerned that you may have sleep disordered breathing with sleep apnea. You will need a sleep study for further evaluation. We are going to try to get you set up for an in lab study. Someone will contact you to schedule this. If there is an issue with insurance, we will have to do a home sleep study first   We discussed how untreated sleep apnea puts an individual at risk for cardiac arrhthymias, pulm HTN, DM, stroke and increases their risk for daytime accidents. We also briefly reviewed treatment options including weight loss, side sleeping position, oral appliance, CPAP therapy or referral to ENT for possible surgical options  Use caution when driving and pull over if you become sleepy.  Follow up in 8 weeks with Katie Elli Groesbeck,NP to go over sleep study results, or sooner, if needed

## 2023-10-16 NOTE — Assessment & Plan Note (Signed)
Rule out sleep disordered breathing. If negative or symptoms persist, obtain iron panel.

## 2023-10-16 NOTE — Assessment & Plan Note (Signed)
Likely multifactorial; possibly related to untreated OSA. See above. Sleep hygiene reviewed.

## 2023-10-19 ENCOUNTER — Ambulatory Visit: Payer: Medicare PPO

## 2023-10-19 DIAGNOSIS — M25552 Pain in left hip: Secondary | ICD-10-CM | POA: Diagnosis not present

## 2023-10-19 DIAGNOSIS — M5459 Other low back pain: Secondary | ICD-10-CM | POA: Diagnosis not present

## 2023-10-19 DIAGNOSIS — M6281 Muscle weakness (generalized): Secondary | ICD-10-CM | POA: Diagnosis not present

## 2023-10-19 DIAGNOSIS — R262 Difficulty in walking, not elsewhere classified: Secondary | ICD-10-CM

## 2023-10-19 DIAGNOSIS — R2681 Unsteadiness on feet: Secondary | ICD-10-CM | POA: Diagnosis not present

## 2023-10-19 DIAGNOSIS — R278 Other lack of coordination: Secondary | ICD-10-CM | POA: Diagnosis not present

## 2023-10-19 NOTE — Therapy (Signed)
OUTPATIENT PHYSICAL THERAPY TREATMENT       Patient Name: Kendra [name] MRN: [id number] DOB:11-Jul-1946, 77 y.o., female Today's Date: 10/19/2023  PCP: Erasmo Downer, MD REFERRING PROVIDER: Erasmo Downer, MD  END OF SESSION:  PT End of Session - 10/19/23 1406     Visit Number 74    Number of Visits 80    Date for PT Re-Evaluation 11/23/23    Authorization Type [employer] PPO    Authorization Time Period 06/10/23-08/05/23    Progress Note Due on Visit 70    PT Start Time 1147    PT Stop Time 1229    PT Time Calculation (min) 42 min    Equipment Utilized During Treatment Gait belt    Activity Tolerance Patient tolerated treatment well;Patient limited by fatigue;Patient limited by pain    Behavior During Therapy [facility] for tasks assessed/performed                   Past Medical History:  Diagnosis Date   Anxiety    Breast cancer (HCC) 03/2018   right breast cancer at 11:00 and 1:00   Bursitis    bilateral hips and knees   Complication of anesthesia    Depression    Family history of breast cancer 03/24/2018   Family history of lung cancer 03/24/2018   History of alcohol dependence (HCC) 2007   no ETOH; resolved since 2007   History of hiatal hernia    History of psychosis 2014   due to sleep disturbance   Hypertension    Parkinson disease (HCC)    Personal history of radiation therapy 06/2018-07/2018   right breast ca   PONV (postoperative nausea and vomiting)    Past Surgical History:  Procedure Laterality Date   BREAST BIOPSY Right 03/14/2018   11:00 DCIS and invasive ductal carcinoma   BREAST BIOPSY Right 03/14/2018   1:00 Invasive ductal carcinoma   BREAST LUMPECTOMY Right 04/27/2018   lumpectomy of 11 and 1:00 cancers, clear margins, negative LN   BREAST LUMPECTOMY WITH RADIOACTIVE SEED AND SENTINEL LYMPH NODE BIOPSY Right 04/27/2018   Procedure: RIGHT BREAST LUMPECTOMY WITH RADIOACTIVE SEED X 2 AND RIGHT SENTINEL LYMPH NODE  BIOPSY;  Surgeon: Glenna Fellows, MD;  Location: [facility];  Service: General;  Laterality: Right;   CATARACT EXTRACTION W/PHACO Left 08/30/2019   Procedure: CATARACT EXTRACTION PHACO AND INTRAOCULAR LENS PLACEMENT (IOC) LEFT panoptix toric  01:20.5  12.7%  10.26;  Surgeon: Lockie Mola, MD;  Location: [facility];  Service: Ophthalmology;  Laterality: Left;   CATARACT EXTRACTION W/PHACO Right 09/20/2019   Procedure: CATARACT EXTRACTION PHACO AND INTRAOCULAR LENS PLACEMENT (IOC) RIGHT 5.71  00:36.4  15.7%;  Surgeon: Lockie Mola, MD;  Location: [facility];  Service: Ophthalmology;  Laterality: Right;   LAPAROTOMY N/A 02/10/2018   Procedure: EXPLORATORY LAPAROTOMY;  Surgeon: Shonna Chock, MD;  Location: [facility];  Service: Gynecology;  Laterality: N/A;   MASS EXCISION  01/2018   abdominal   OVARIAN CYST REMOVAL     SALPINGOOPHORECTOMY Bilateral 02/10/2018   Procedure: BILATERAL SALPINGO OOPHORECTOMY; PERITONEAL WASHINGS;  Surgeon: Shonna Chock, MD;  Location: [facility];  Service: Gynecology;  Laterality: Bilateral;   TONSILLECTOMY     TONSILLECTOMY AND ADENOIDECTOMY  1953   Patient Active Problem List   Diagnosis Date Noted   Excessive daytime sleepiness 10/16/2023   Restless leg syndrome 10/16/2023   Memory impairment 10/16/2023   Tardive dyskinesia 09/09/2023   Snoring 09/09/2023  Neurogenic bladder 05/14/2023   Prediabetes 12/15/2022   Chronic cough 08/17/2022   History of allergy to shellfish 02/24/2022   Chronic low back pain of over 3 months duration 02/04/2022   Osteoarthritis of  AC (acromioclavicular) joint (Left) 02/04/2022   Osteoarthritis of hip (Right) 02/04/2022   Impaction fracture of distal end of radius and ulna, sequela (Left) 02/04/2022   Osteoarthritis of 1st (thumb) carpometacarpal (CMC) joint of hand (Left) 02/04/2022   Lumbosacral facet arthropathy (Multilevel) (Bilateral) (T12-S1) 02/04/2022   Lumbar central  spinal stenosis w/o neurogenic claudication (L2-3, L3-4, L4-5) 02/04/2022   Lumbar foraminal stenosis (Bilateral: L2-3, L4-5) (Right: (Severe) L4-5) 02/04/2022   Lumbar lateral recess stenosis (Bilateral: L2-3, L3-4, and L4-5) (Severe) 02/04/2022   DDD (degenerative disc disease), lumbar 02/04/2022   Lumbosacral facet syndrome (Multilevel) (Bilateral) 02/04/2022   Osteoarthritis of lumbar spine 02/04/2022   Allergic conjunctivitis of both eyes 12/23/2021   Chronic hip pain (3ry area of Pain) (Bilateral) (L>R) 12/22/2021   Chronic lower extremity pain (2ry area of Pain) (Bilateral) (L>R) 12/22/2021   Abnormal MRI, lumbar spine (01/31/2020) 12/22/2021   Grade 1 Anterolisthesis of lumbar spine (L3/L4) & (5 mm) (L4/L5) 12/22/2021   Chronic pain syndrome 11/17/2021   High risk medication use 11/17/2021   Disorder of skeletal system 11/17/2021   Problems influencing health status 11/17/2021   Other hyperlipidemia 10/21/2021   Polypharmacy 08/12/2021   Chronic low back pain (1ry area of Pain) (Bilateral) (L>R) w/o sciatica 06/18/2021   Osteopenia of neck of femur (Right) 09/06/2020   Obesity 08/29/2020   Parkinson disease (HCC) 02/22/2020   Mucinous cystadenoma 12/24/2019   Bipolar disorder, in full remission, most recent episode depressed (HCC) 11/08/2019   Vitamin D insufficiency 08/24/2019   Insomnia 05/31/2019   Bipolar I disorder, most recent episode depressed (HCC) 05/31/2019   Alcohol use disorder, moderate, in sustained remission (HCC) 05/31/2019   Elevated tumor markers 05/27/2019   GAD (generalized anxiety disorder) 04/10/2019   MDD (major depressive disorder), severe (HCC) 01/31/2019   High serum carbohydrate antigen 19-9 (CA19-9) 12/01/2018   Adjustment disorder with anxious mood 11/24/2018   Essential hypertension 11/24/2018   B12 deficiency 10/17/2018   Incisional hernia, without obstruction or gangrene 09/12/2018   Genetic testing 04/07/2018   Family history of breast  cancer 03/24/2018   Family history of lung cancer 03/24/2018   Malignant neoplasm of upper-outer quadrant of right breast in female, estrogen receptor positive (HCC) 03/17/2018   Malignant neoplasm of upper-inner quadrant of right breast in female, estrogen receptor positive (HCC) 03/17/2018   History of psychosis 02/07/2018   History of insomnia 02/07/2018    ONSET DATE: Pt diagnosed with PD in 2019  REFERRING DIAG:  G20 (ICD-10-CM) - Parkinson's disease      THERAPY DIAG:  Muscle weakness (generalized)  Pain in left hip  Difficulty in walking, not elsewhere classified  Unsteadiness on feet  Rationale for Evaluation and Treatment: Rehabilitation  SUBJECTIVE:   Pt reports no updates, no stumbles/falls. Her hip feels OK at the moment. Pt recently saw physician for sleep consult, says it went well.  PERTINENT HISTORY:  Pt is a pleasant 77 yo female who was diagnosed with PD in 2019. She is known to clinic and [facility], and was last seen for LSVT BIG Sept-Oct 2022. Pt with chronic pain and has become increasingly sedentary as a result exacerbating mobility difficulties due to PD. Current concerns: Pt reports L and R hip pain and back pain. Pt was being seen by pain  management clinic. Pt has had injections that provided relief at the time. Pt's spouse reports her physician determined surgery was not an option for her pain so the plan is to manage it. Pt spouse thinks pain has resulted in reluctance for pt to move. Pt currently sedentary, doesn't walk much, but when she does she holds on to furniture for support due to balance problems. When pt first stands she must hold on to something due to mix of pain and/or dizziness (described as lightheadedness). Her pain can go from 3/10 to 9/10. It does improve once she starts moving. Pt has increased difficulty with multiple activities including: getting in and out of her bed, getting in and out of her car, freezing, decreased gait speed, decreased  balance and cognition, and difficulty using BUE due to tremors.  Per chart other PMH includes hx of breast cancer, HTN, MCI, insomnia, cataracts, bipolar 1, alcohol use disorder, GAD, MDD, osteopenia of neck of R femur, chronic B low back pain with radiating pain to buttock and posterior thighs and calves. Pt with multiple comorbidities, please refer to chart for full details    PAIN: from eval Are you having pain? Yes 7/10 Left hip   PRECAUTIONS: Fall  WEIGHT BEARING RESTRICTIONS: No  FALLS: Has patient fallen in last 6 months? Yes. Number of falls 1x, leg tangled in chair leg when getting up from a chair  PLOF: Independent with basic ADLs  PATIENT GOALS: improve mobility  OBJECTIVE   TODAY'S TREATMENT:                                                                                                                                         DATE: 10/19/23   Gait belt donned throughout for pt safety  TE: Ambulation for endurance 1x296 ft with one seated rest break due to reports of R glute pain, describes as a numbness. Pt reports this pain/numbness improves once seated.  Modified cardio/endurance training to seated intervention on nustep: cuing for intensity/speed, PT adjusts resistance level throughout and monitors pt for response Lvl 1 x 1 min  Lvl 2 x 1 min Lvl 1 x 1 min Lvl 2 x 1 min Lvl 1 x 1 min Lvl 3 x 1 min Lvl 4 x 1 min Lvl 1 x 1 min  Comments: Pt rates activity medium, no increased pain or numbness with activity  Seated march - attempted bilaterally but discontinued due to low back pain LTL step outs (seated)  20x each side - pain free  STS 12x not pain limited   NMR:  Large-amplitude STS with BUE flexion/overhead raise 10x - limited/modified due to LBP  --modified to bilat shoulder abduction with slight ext 10x without increase in pain Standing big cross reach and clap 10x - difficulty sustaining correct technique/frequent cues Standing FWD step and bilat FWD  reach 1x9-10 with each LE close CGA - fatiguing, pt  discontinues second set due to reports of LBP      PATIENT EDUCATION:  Education details: Pt educated throughout session about proper posture and technique with exercises. Improved exercise technique, movement at target joints, use of target muscles after min to mod verbal, visual, tactile cues.  Person educated: Patient Education method: Explanation, Demonstration, Tactile cues, and Verbal cues Education comprehension: verbalized understanding, returned demonstration, verbal cues required, tactile cues required, and needs further education    HOME EXERCISE PROGRAM:   8//20/24: Access Code: [email or web address] URL: [email or web address] Date: 06/22/2023 Prepared by: Temple Pacini  Exercises - Sit to Stand with Counter Support  - 1 x daily - 4-6 x weekly - 2 sets - 10 reps - Seated Long Arc Quad  - 1 x daily - 4-6 x weekly - 2 sets - 12 reps - Seated Heel Raise  - 1 x daily - 4-6 x weekly - 1 sets - 20 reps    GOALS: Goals reviewed with patient? Yes  SHORT TERM GOALS: Target date: 04/27/2023    Patient will be independent in home exercise program to improve strength/mobility for better functional independence with ADLs. Baseline: LSVT BIG protocol to be initiated next session; 12/18: not yet fully indep, reports she still gets confused about the technique; 12/03/2022: HEP to be updated; Pt reports practicing HEP 2x/week; 02/23/23: not currently performing HEP, reviewed in session and provided new copy; 03/16/2023: pt reports performing HEP every day; 03/30/23: Pt reports she is not being consistent with her HEP; 7/1: pt reports she is not doing much at home; 06/10/23: pt unsure how often she is exercising at home; 10/8: pt repots unsure about her HEP Goal status: MET (previously MET)   LONG TERM GOALS: Target date: 06/08/2023     Patient will increase FOTO score to equal to or greater than 49 to demonstrate statistically  significant improvement in mobility and quality of life.  Baseline: 40; 12/18: deferred due to time; 45; 3/19: deferred to next visit; 01/21/2023; 02/23/2023: 46 unchanged from previous assessment); 03/17/2023: 48; 06/10/23: 52 Goal status: MET  2.  Patient (> 16 years old) will complete five times sit to stand test in < 15 seconds indicating decreased fall risk.  Baseline: 27 sec; 12/18 23 sec hands-free; 11/04/22: 24 sec hands free; 2/1/124: 22.7 sec use of BUE, 21.3 sec hands-free; 01/19/23: 18 seconds hands-free; 02/23/23: 19 seconds hands-free; 03/16/2023: 18 seconds hands-free; 03/30/23: 17.3 seconds hands-free; 05/03/23: 17.3 seconds hands-free; 06/10/23: 19 seconds hands-free; 10/8: 16 seconds hands-free; 09/21/23: 17 sec hands-free; 10/12/23: 20 sec hands-free Goal status: PARTIALLY MET  3.  Patient will increase 10 meter walk test to >1.61m/s with WFL B step-length, upright posture for >90% of test to improve ease with gait and to override hypokentic and bradykinetic movement.  Baseline: 0.58 m/s with 2ZH; 12/18: 0.7 m/s with 0QM; 11/04/22: 0.69 m/s with 5HQ;  12/03/22: 0.73 m/s with 4ON; 01/19/2023: 0.77 m/s with 6EX; 02/23/2023: 0.83 m/s with 5MW; 03/16/2023: 0.83 m/s with 4XL , pt still somewhat crouched, requires cues for upright posture >50% of time; 03/30/23: 0.83 m/s with crouched posture; 05/03/23 0.8 m/s with RW, some improvement with upright posture; 06/10/23:  0.77 m/s with 2GM and crouched posture; 10/8: 0.74 m/s 4WW; 09/21/23: 0.71 m/s with 0NU; 12/10:  0.57 m/s with 4WW Goal status: IN PROGRESS  4.  Patient will reduce time to don jacket by at least 5 seconds in order to increase ease with dressing and override bradykinetic movement Baseline: 17 seconds; 12/18: 12 seconds  Goal status: MET  5.  Pt will demo or report ability to open standard jar using only 1-2 attempts in order to exhibit improved functional mobility for ADLs.  Baseline: 5.7 seconds with multiple attempts to turn jar lid to open; 12/18:  2.8 sec opens in 2 turns Goal status: MET  6.  Patient will increase Berg Balance score by > 6 points to demonstrate decreased fall risk during functional activities. Baseline: to be tested next 1-2 visits; 12/03/2022: 44/56; 01/21/23 45/56; 48/56; 03/17/2023: 48/56; 05/03/23: 19/14; 8/8: 48/56; 08/10/2023: deferred; 11/19: 51/56  Goal status: GOAL MET  7.  Patient will report at least 4 days in a row with minimal to no L hip pain in order to improve QOL and ease/safety with functional mobility. Baseline: Pt reports pain majority of days in L hip; 12/03/2022: pt reports having hip pain about every day; 3/19: rates hip pain daily (being seen now by MD, new imaging); 02/23/2023: pt went for period of time without hip pain (she thinks a week), but reports it has now come back,; 5/28: pt reports hip pain "every other day"; 05/03/23: pt reports pain in hip every day in past week Goal status: NOT MET/discontinued   8. Patient will increase six minute walk test distance to >1000 for progression to community ambulator and improve gait ability Baseline:02/23/2023: 722 ft with 4WW and two standing rest breaks, rates test as hard; 03/16/2023: deferred to next visit due to time; 5/28/20204: 641 ft with 4WW, pt requests to end test early due to fatigue at 5 min 35 sec; 05/03/23: 645 ft with 4WW and able to complete full test; 06/10/23: 708 ft; 08/10/23: 632 ft with 3 rest breaks; 11/19: 592 ft with 4WW, pt requests to end test early at about 5 min due to fatigue; 10/21/23: Pt completes full 6 minutes with 4 standing rest breaks, ambulates total of 535 ft with 4WW  Goal status: IN PROGRESS   ASSESSMENT:  Pt limited more by low back pain today, more prominent on R side/felt into R glute, then with L hip pain. Interventions adjusted and modified throughout for improved comfort/to prevent pain increase. Will continue to monitor pt's symptoms with activity. Pt reports she has had low back pain in the past that improved with exercise.  The pt will benefit from one more cert of skilled PT to improve balance, gait and mobility.  OBJECTIVE IMPAIRMENTS: Abnormal gait, decreased balance, decreased cognition, decreased coordination, decreased endurance, decreased knowledge of use of DME, decreased mobility, difficulty walking, decreased strength, dizziness, hypomobility, impaired UE functional use, improper body mechanics, postural dysfunction, and pain.   ACTIVITY LIMITATIONS: lifting, bending, standing, squatting, stairs, transfers, bed mobility, bathing, dressing, hygiene/grooming, and locomotion level  PARTICIPATION LIMITATIONS: meal prep, cleaning, laundry, medication management, personal finances, driving, shopping, community activity, and yard work  PERSONAL FACTORS: Age, Fitness, Sex, Time since onset of injury/illness/exacerbation, and 3+ comorbidities: PMH includes hx of breast cancer, HTN, MCI, insomnia, cataracts, bipolar 1, alcohol use disorder, GAD, MDD, osteopenia of neck of R femur, chronic B low back pain with radiating pain to buttock and posterior thighs and calves. Pt with multiple comorbidities, please refer to chart for full details  are also affecting patient's functional outcome.   REHAB POTENTIAL: Fair    CLINICAL DECISION MAKING: Evolving/moderate complexity  EVALUATION COMPLEXITY: High  PLAN:  PT FREQUENCY: 1-2x/week  PT DURATION: 6 weeks     PLANNED INTERVENTIONS: Therapeutic exercises, Therapeutic activity, Neuromuscular re-education, Balance training, Gait training, Patient/Family education, Self Care,  Joint mobilization, Stair training, Vestibular training, Canalith repositioning, Visual/preceptual remediation/compensation, Orthotic/Fit training, DME instructions, Wheelchair mobility training, Spinal mobilization, Cryotherapy, Moist heat, Splintting, Taping, and Manual therapy  PLAN FOR NEXT SESSION: endurance, higher level balance in // bars, strength   Temple Pacini PT, DPT  Physical  Therapist - [facility]  10/19/2023, 2:07 PM

## 2023-10-21 DIAGNOSIS — H6123 Impacted cerumen, bilateral: Secondary | ICD-10-CM | POA: Diagnosis not present

## 2023-10-21 DIAGNOSIS — H903 Sensorineural hearing loss, bilateral: Secondary | ICD-10-CM | POA: Diagnosis not present

## 2023-10-21 DIAGNOSIS — H6063 Unspecified chronic otitis externa, bilateral: Secondary | ICD-10-CM | POA: Diagnosis not present

## 2023-10-26 ENCOUNTER — Telehealth: Payer: Self-pay

## 2023-10-26 ENCOUNTER — Ambulatory Visit: Payer: Medicare PPO

## 2023-10-26 DIAGNOSIS — F411 Generalized anxiety disorder: Secondary | ICD-10-CM

## 2023-10-26 DIAGNOSIS — R278 Other lack of coordination: Secondary | ICD-10-CM | POA: Diagnosis not present

## 2023-10-26 DIAGNOSIS — M25552 Pain in left hip: Secondary | ICD-10-CM | POA: Diagnosis not present

## 2023-10-26 DIAGNOSIS — M6281 Muscle weakness (generalized): Secondary | ICD-10-CM | POA: Diagnosis not present

## 2023-10-26 DIAGNOSIS — R262 Difficulty in walking, not elsewhere classified: Secondary | ICD-10-CM

## 2023-10-26 DIAGNOSIS — R2681 Unsteadiness on feet: Secondary | ICD-10-CM | POA: Diagnosis not present

## 2023-10-26 DIAGNOSIS — M5459 Other low back pain: Secondary | ICD-10-CM | POA: Diagnosis not present

## 2023-10-26 MED ORDER — FLUOXETINE HCL 60 MG PO TABS: 1.0000 | ORAL_TABLET | Freq: Every day | ORAL | 0 refills | Status: DC

## 2023-10-26 NOTE — Telephone Encounter (Signed)
Received fax from pharmacy requesting a refill for the following medication called pharmacy spoke to [place] patient does not have refill on file please advise   FLUoxetine HCl 60 MG TABS   Last visit 09-09-23 Next visit 12-14-23   Preferred pharmacy  [facility] [id number] - [place], [place] - [id number] Phone: [phone]  Fax: [phone]

## 2023-10-26 NOTE — Therapy (Signed)
OUTPATIENT PHYSICAL THERAPY TREATMENT       Patient Name: Kendra [name] MRN: [id number] DOB:1946/08/03, 77 y.o., female Today's Date: 10/26/2023  PCP: Erasmo Downer, MD REFERRING PROVIDER: Erasmo Downer, MD  END OF SESSION:  PT End of Session - 10/26/23 1149     Visit Number 75    Number of Visits 80    Date for PT Re-Evaluation 11/23/23    Authorization Type [employer] PPO    Authorization Time Period 06/10/23-08/05/23    Progress Note Due on Visit 70    PT Start Time 1101    PT Stop Time 1141    PT Time Calculation (min) 40 min    Equipment Utilized During Treatment Gait belt    Activity Tolerance Patient tolerated treatment well;Patient limited by fatigue;Patient limited by pain    Behavior During Therapy [facility] for tasks assessed/performed                    Past Medical History:  Diagnosis Date   Anxiety    Breast cancer (HCC) 03/2018   right breast cancer at 11:00 and 1:00   Bursitis    bilateral hips and knees   Complication of anesthesia    Depression    Family history of breast cancer 03/24/2018   Family history of lung cancer 03/24/2018   History of alcohol dependence (HCC) 2007   no ETOH; resolved since 2007   History of hiatal hernia    History of psychosis 2014   due to sleep disturbance   Hypertension    Parkinson disease (HCC)    Personal history of radiation therapy 06/2018-07/2018   right breast ca   PONV (postoperative nausea and vomiting)    Past Surgical History:  Procedure Laterality Date   BREAST BIOPSY Right 03/14/2018   11:00 DCIS and invasive ductal carcinoma   BREAST BIOPSY Right 03/14/2018   1:00 Invasive ductal carcinoma   BREAST LUMPECTOMY Right 04/27/2018   lumpectomy of 11 and 1:00 cancers, clear margins, negative LN   BREAST LUMPECTOMY WITH RADIOACTIVE SEED AND SENTINEL LYMPH NODE BIOPSY Right 04/27/2018   Procedure: RIGHT BREAST LUMPECTOMY WITH RADIOACTIVE SEED X 2 AND RIGHT SENTINEL LYMPH  NODE BIOPSY;  Surgeon: Glenna Fellows, MD;  Location: [facility];  Service: General;  Laterality: Right;   CATARACT EXTRACTION W/PHACO Left 08/30/2019   Procedure: CATARACT EXTRACTION PHACO AND INTRAOCULAR LENS PLACEMENT (IOC) LEFT panoptix toric  01:20.5  12.7%  10.26;  Surgeon: Lockie Mola, MD;  Location: [facility];  Service: Ophthalmology;  Laterality: Left;   CATARACT EXTRACTION W/PHACO Right 09/20/2019   Procedure: CATARACT EXTRACTION PHACO AND INTRAOCULAR LENS PLACEMENT (IOC) RIGHT 5.71  00:36.4  15.7%;  Surgeon: Lockie Mola, MD;  Location: [facility];  Service: Ophthalmology;  Laterality: Right;   LAPAROTOMY N/A 02/10/2018   Procedure: EXPLORATORY LAPAROTOMY;  Surgeon: Shonna Chock, MD;  Location: [facility];  Service: Gynecology;  Laterality: N/A;   MASS EXCISION  01/2018   abdominal   OVARIAN CYST REMOVAL     SALPINGOOPHORECTOMY Bilateral 02/10/2018   Procedure: BILATERAL SALPINGO OOPHORECTOMY; PERITONEAL WASHINGS;  Surgeon: Shonna Chock, MD;  Location: [facility];  Service: Gynecology;  Laterality: Bilateral;   TONSILLECTOMY     TONSILLECTOMY AND ADENOIDECTOMY  1953   Patient Active Problem List   Diagnosis Date Noted   Excessive daytime sleepiness 10/16/2023   Restless leg syndrome 10/16/2023   Memory impairment 10/16/2023   Tardive dyskinesia 09/09/2023   Snoring  09/09/2023   Neurogenic bladder 05/14/2023   Prediabetes 12/15/2022   Chronic cough 08/17/2022   History of allergy to shellfish 02/24/2022   Chronic low back pain of over 3 months duration 02/04/2022   Osteoarthritis of  AC (acromioclavicular) joint (Left) 02/04/2022   Osteoarthritis of hip (Right) 02/04/2022   Impaction fracture of distal end of radius and ulna, sequela (Left) 02/04/2022   Osteoarthritis of 1st (thumb) carpometacarpal (CMC) joint of hand (Left) 02/04/2022   Lumbosacral facet arthropathy (Multilevel) (Bilateral) (T12-S1) 02/04/2022   Lumbar  central spinal stenosis w/o neurogenic claudication (L2-3, L3-4, L4-5) 02/04/2022   Lumbar foraminal stenosis (Bilateral: L2-3, L4-5) (Right: (Severe) L4-5) 02/04/2022   Lumbar lateral recess stenosis (Bilateral: L2-3, L3-4, and L4-5) (Severe) 02/04/2022   DDD (degenerative disc disease), lumbar 02/04/2022   Lumbosacral facet syndrome (Multilevel) (Bilateral) 02/04/2022   Osteoarthritis of lumbar spine 02/04/2022   Allergic conjunctivitis of both eyes 12/23/2021   Chronic hip pain (3ry area of Pain) (Bilateral) (L>R) 12/22/2021   Chronic lower extremity pain (2ry area of Pain) (Bilateral) (L>R) 12/22/2021   Abnormal MRI, lumbar spine (01/31/2020) 12/22/2021   Grade 1 Anterolisthesis of lumbar spine (L3/L4) & (5 mm) (L4/L5) 12/22/2021   Chronic pain syndrome 11/17/2021   High risk medication use 11/17/2021   Disorder of skeletal system 11/17/2021   Problems influencing health status 11/17/2021   Other hyperlipidemia 10/21/2021   Polypharmacy 08/12/2021   Chronic low back pain (1ry area of Pain) (Bilateral) (L>R) w/o sciatica 06/18/2021   Osteopenia of neck of femur (Right) 09/06/2020   Obesity 08/29/2020   Parkinson disease (HCC) 02/22/2020   Mucinous cystadenoma 12/24/2019   Bipolar disorder, in full remission, most recent episode depressed (HCC) 11/08/2019   Vitamin D insufficiency 08/24/2019   Insomnia 05/31/2019   Bipolar I disorder, most recent episode depressed (HCC) 05/31/2019   Alcohol use disorder, moderate, in sustained remission (HCC) 05/31/2019   Elevated tumor markers 05/27/2019   GAD (generalized anxiety disorder) 04/10/2019   MDD (major depressive disorder), severe (HCC) 01/31/2019   High serum carbohydrate antigen 19-9 (CA19-9) 12/01/2018   Adjustment disorder with anxious mood 11/24/2018   Essential hypertension 11/24/2018   B12 deficiency 10/17/2018   Incisional hernia, without obstruction or gangrene 09/12/2018   Genetic testing 04/07/2018   Family history of  breast cancer 03/24/2018   Family history of lung cancer 03/24/2018   Malignant neoplasm of upper-outer quadrant of right breast in female, estrogen receptor positive (HCC) 03/17/2018   Malignant neoplasm of upper-inner quadrant of right breast in female, estrogen receptor positive (HCC) 03/17/2018   History of psychosis 02/07/2018   History of insomnia 02/07/2018    ONSET DATE: Pt diagnosed with PD in 2019  REFERRING DIAG:  G20 (ICD-10-CM) - Parkinson's disease      THERAPY DIAG:  Muscle weakness (generalized)  Unsteadiness on feet  Difficulty in walking, not elsewhere classified  Other lack of coordination  Pain in left hip  Other low back pain  Rationale for Evaluation and Treatment: Rehabilitation  SUBJECTIVE:   Pt presents with bruise below lower lip, unsure if she bumped it or fell. Caregiver reports she is not aware of any recent falls. Pt reports continued back pain she rates moderate and hip pain is moderate.  She describes her back pain as an ache.  PERTINENT HISTORY:  Pt is a pleasant 77 yo female who was diagnosed with PD in 2019. She is known to clinic and [facility], and was last seen for LSVT BIG Sept-Oct 2022. Pt  with chronic pain and has become increasingly sedentary as a result exacerbating mobility difficulties due to PD. Current concerns: Pt reports L and R hip pain and back pain. Pt was being seen by pain management clinic. Pt has had injections that provided relief at the time. Pt's spouse reports her physician determined surgery was not an option for her pain so the plan is to manage it. Pt spouse thinks pain has resulted in reluctance for pt to move. Pt currently sedentary, doesn't walk much, but when she does she holds on to furniture for support due to balance problems. When pt first stands she must hold on to something due to mix of pain and/or dizziness (described as lightheadedness). Her pain can go from 3/10 to 9/10. It does improve once she starts moving.  Pt has increased difficulty with multiple activities including: getting in and out of her bed, getting in and out of her car, freezing, decreased gait speed, decreased balance and cognition, and difficulty using BUE due to tremors.  Per chart other PMH includes hx of breast cancer, HTN, MCI, insomnia, cataracts, bipolar 1, alcohol use disorder, GAD, MDD, osteopenia of neck of R femur, chronic B low back pain with radiating pain to buttock and posterior thighs and calves. Pt with multiple comorbidities, please refer to chart for full details    PAIN: from eval Are you having pain? Yes 7/10 Left hip   PRECAUTIONS: Fall  WEIGHT BEARING RESTRICTIONS: No  FALLS: Has patient fallen in last 6 months? Yes. Number of falls 1x, leg tangled in chair leg when getting up from a chair  PLOF: Independent with basic ADLs  PATIENT GOALS: improve mobility  OBJECTIVE   TODAY'S TREATMENT:                                                                                                                                         DATE: 10/26/23   Gait belt donned throughout for pt safety  TE: Ambulation for endurance 2x148 ft. Pt requests rest breaks following each set   Modified cardio/endurance training to seated intervention on nustep: cuing for intensity/speed, PT adjusts resistance level throughout and monitors pt for response Lvl 1 x 1 min  Lvl 2 x 1 min Lvl 3 x 1 min Lvl 4 x 1 min Lvl 3 x 1 min Lvl 2 x 1 min Lvl 1 x 1 min  Comments: Pt rates moderate, no increased pain with activity. SPM maintained 20s-30s.  2# aw donned each LE LAQ 2x15 each LE  Gait for endurance/strength with RW x 148 ft  Seated march 10x RLE, limited to 5x LLE due to hip pain  LTL step outs/HIP IR (seated)  3x10 each side - pain free  Seated heel raises - cues to attend to activity, pt rates hard   STS 2x10 not pain limited    PATIENT EDUCATION:  Education details: Pt educated  throughout session about proper posture  and technique with exercises. Improved exercise technique, movement at target joints, use of target muscles after min to mod verbal, visual, tactile cues.  Person educated: Patient Education method: Explanation, Demonstration, Tactile cues, and Verbal cues Education comprehension: verbalized understanding, returned demonstration, verbal cues required, tactile cues required, and needs further education    HOME EXERCISE PROGRAM:   8//20/24: Access Code: [email or web address] URL: [email or web address] Date: 06/22/2023 Prepared by: Temple Pacini  Exercises - Sit to Stand with Counter Support  - 1 x daily - 4-6 x weekly - 2 sets - 10 reps - Seated Long Arc Quad  - 1 x daily - 4-6 x weekly - 2 sets - 12 reps - Seated Heel Raise  - 1 x daily - 4-6 x weekly - 1 sets - 20 reps    GOALS: Goals reviewed with patient? Yes  SHORT TERM GOALS: Target date: 04/27/2023    Patient will be independent in home exercise program to improve strength/mobility for better functional independence with ADLs. Baseline: LSVT BIG protocol to be initiated next session; 12/18: not yet fully indep, reports she still gets confused about the technique; 12/03/2022: HEP to be updated; Pt reports practicing HEP 2x/week; 02/23/23: not currently performing HEP, reviewed in session and provided new copy; 03/16/2023: pt reports performing HEP every day; 03/30/23: Pt reports she is not being consistent with her HEP; 7/1: pt reports she is not doing much at home; 06/10/23: pt unsure how often she is exercising at home; 10/8: pt repots unsure about her HEP Goal status: MET (previously MET)   LONG TERM GOALS: Target date: 06/08/2023     Patient will increase FOTO score to equal to or greater than 49 to demonstrate statistically significant improvement in mobility and quality of life.  Baseline: 40; 12/18: deferred due to time; 45; 3/19: deferred to next visit; 01/21/2023; 02/23/2023: 46 unchanged from previous assessment); 03/17/2023:  48; 06/10/23: 52 Goal status: MET  2.  Patient (> 5 years old) will complete five times sit to stand test in < 15 seconds indicating decreased fall risk.  Baseline: 27 sec; 12/18 23 sec hands-free; 11/04/22: 24 sec hands free; 2/1/124: 22.7 sec use of BUE, 21.3 sec hands-free; 01/19/23: 18 seconds hands-free; 02/23/23: 19 seconds hands-free; 03/16/2023: 18 seconds hands-free; 03/30/23: 17.3 seconds hands-free; 05/03/23: 17.3 seconds hands-free; 06/10/23: 19 seconds hands-free; 10/8: 16 seconds hands-free; 09/21/23: 17 sec hands-free; 10/12/23: 20 sec hands-free Goal status: PARTIALLY MET  3.  Patient will increase 10 meter walk test to >1.17m/s with WFL B step-length, upright posture for >90% of test to improve ease with gait and to override hypokentic and bradykinetic movement.  Baseline: 0.58 m/s with 4NW; 12/18: 0.7 m/s with 2NF; 11/04/22: 0.69 m/s with 6OZ;  12/03/22: 0.73 m/s with 3YQ; 01/19/2023: 0.77 m/s with 6VH; 02/23/2023: 0.83 m/s with 8IO; 03/16/2023: 0.83 m/s with 9GE , pt still somewhat crouched, requires cues for upright posture >50% of time; 03/30/23: 0.83 m/s with crouched posture; 05/03/23 0.8 m/s with RW, some improvement with upright posture; 06/10/23:  0.77 m/s with 9BM and crouched posture; 10/8: 0.74 m/s 4WW; 09/21/23: 0.71 m/s with 8UX; 12/10:  0.57 m/s with 4WW Goal status: IN PROGRESS  4.  Patient will reduce time to don jacket by at least 5 seconds in order to increase ease with dressing and override bradykinetic movement Baseline: 17 seconds; 12/18: 12 seconds Goal status: MET  5.  Pt will demo or report ability to open standard jar using only 1-2  attempts in order to exhibit improved functional mobility for ADLs.  Baseline: 5.7 seconds with multiple attempts to turn jar lid to open; 12/18: 2.8 sec opens in 2 turns Goal status: MET  6.  Patient will increase Berg Balance score by > 6 points to demonstrate decreased fall risk during functional activities. Baseline: to be tested next 1-2  visits; 12/03/2022: 44/56; 01/21/23 45/56; 48/56; 03/17/2023: 48/56; 05/03/23: 16/10; 8/8: 48/56; 08/10/2023: deferred; 11/19: 51/56  Goal status: GOAL MET  7.  Patient will report at least 4 days in a row with minimal to no L hip pain in order to improve QOL and ease/safety with functional mobility. Baseline: Pt reports pain majority of days in L hip; 12/03/2022: pt reports having hip pain about every day; 3/19: rates hip pain daily (being seen now by MD, new imaging); 02/23/2023: pt went for period of time without hip pain (she thinks a week), but reports it has now come back,; 5/28: pt reports hip pain "every other day"; 05/03/23: pt reports pain in hip every day in past week Goal status: NOT MET/discontinued   8. Patient will increase six minute walk test distance to >1000 for progression to community ambulator and improve gait ability Baseline:02/23/2023: 722 ft with 4WW and two standing rest breaks, rates test as hard; 03/16/2023: deferred to next visit due to time; 5/28/20204: 641 ft with 4WW, pt requests to end test early due to fatigue at 5 min 35 sec; 05/03/23: 645 ft with 4WW and able to complete full test; 06/10/23: 708 ft; 08/10/23: 632 ft with 3 rest breaks; 11/19: 592 ft with 4WW, pt requests to end test early at about 5 min due to fatigue; 10/21/23: Pt completes full 6 minutes with 4 standing rest breaks, ambulates total of 535 ft with 4WW  Goal status: IN PROGRESS   ASSESSMENT:  Pt continues to be limited in session by back pain described as an ache, interventions modified and adjusted as to improve for comfort. With pt's permission PT provided info regarding pt's current back pain to caregiver with instruction to discuss with pt's spouse. Pt reports she is aware that pt has been having back pain and verbalized understanding. The pt will benefit from one more cert of skilled PT to improve balance, gait and mobility.  OBJECTIVE IMPAIRMENTS: Abnormal gait, decreased balance, decreased cognition, decreased  coordination, decreased endurance, decreased knowledge of use of DME, decreased mobility, difficulty walking, decreased strength, dizziness, hypomobility, impaired UE functional use, improper body mechanics, postural dysfunction, and pain.   ACTIVITY LIMITATIONS: lifting, bending, standing, squatting, stairs, transfers, bed mobility, bathing, dressing, hygiene/grooming, and locomotion level  PARTICIPATION LIMITATIONS: meal prep, cleaning, laundry, medication management, personal finances, driving, shopping, community activity, and yard work  PERSONAL FACTORS: Age, Fitness, Sex, Time since onset of injury/illness/exacerbation, and 3+ comorbidities: PMH includes hx of breast cancer, HTN, MCI, insomnia, cataracts, bipolar 1, alcohol use disorder, GAD, MDD, osteopenia of neck of R femur, chronic B low back pain with radiating pain to buttock and posterior thighs and calves. Pt with multiple comorbidities, please refer to chart for full details  are also affecting patient's functional outcome.   REHAB POTENTIAL: Fair    CLINICAL DECISION MAKING: Evolving/moderate complexity  EVALUATION COMPLEXITY: High  PLAN:  PT FREQUENCY: 1-2x/week  PT DURATION: 6 weeks     PLANNED INTERVENTIONS: Therapeutic exercises, Therapeutic activity, Neuromuscular re-education, Balance training, Gait training, Patient/Family education, Self Care, Joint mobilization, Stair training, Vestibular training, Canalith repositioning, Visual/preceptual remediation/compensation, Orthotic/Fit training, DME instructions, Wheelchair mobility  training, Spinal mobilization, Cryotherapy, Moist heat, Splintting, Taping, and Manual therapy  PLAN FOR NEXT SESSION: endurance, higher level balance in // bars, strength   Temple Pacini PT, DPT  Physical Therapist - [facility]  [facility]  10/26/2023, 11:50 AM

## 2023-11-01 ENCOUNTER — Telehealth: Payer: Self-pay | Admitting: Nurse Practitioner

## 2023-11-01 ENCOUNTER — Ambulatory Visit: Payer: Self-pay | Admitting: *Deleted

## 2023-11-01 NOTE — Telephone Encounter (Signed)
Patient was seen by Rhunette Croft on 10/14/2023 and a spilt night sleep study was ordered. We received  a note from [facility] today stating patient refused to schedule sleep study

## 2023-11-01 NOTE — Telephone Encounter (Signed)
Reason for Disposition  MILD-MODERATE diarrhea (e.g., 1-6 times / day more than normal)  Answer Assessment - Initial Assessment Questions 1. ONSET: "When did the pain begin?"      Husband and wife on speaker phone.    Wife is c/o pain 7/10 in back.   Husband helping out too.    She is c/o pain across sacrum.   She's in recliner with a heating pad.   Legs are elevated.   She lost control of her bowels this morning.   She didn't make it to the bathroom.     She's having diarrhea and was incontinent of stool.   2. LOCATION: "Where does it hurt?" (upper, mid or lower back)     Across sacrum.    3. SEVERITY: "How bad is the pain?"  (e.g., Scale 1-10; mild, moderate, or severe)   - MILD (1-3): Doesn't interfere with normal activities.    - MODERATE (4-7): Interferes with normal activities or awakens from sleep.    - SEVERE (8-10): Excruciating pain, unable to do any normal activities.      7/10.    4. PATTERN: "Is the pain constant?" (e.g., yes, no; constant, intermittent)      I'm actually calling because of the diarrhea.   So triage stopped for back issue.   That's an ongoing situation.   Not new.      5. RADIATION: "Does the pain shoot into your legs or somewhere else?"     *No Answer* 6. CAUSE:  "What do you think is causing the back pain?"      *No Answer* 7. BACK OVERUSE:  "Any recent lifting of heavy objects, strenuous work or exercise?"     *No Answer* 8. MEDICINES: "What have you taken so far for the pain?" (e.g., nothing, acetaminophen, NSAIDS)     *No Answer* 9. NEUROLOGIC SYMPTOMS: "Do you have any weakness, numbness, or problems with bowel/bladder control?"     *No Answer* 10. OTHER SYMPTOMS: "Do you have any other symptoms?" (e.g., fever, abdomen pain, burning with urination, blood in urine)       *No Answer* 11. PREGNANCY: "Is there any chance you are pregnant?" "When was your last menstrual period?"       *No Answer*  Answer Assessment - Initial Assessment Questions 1.  DIARRHEA SEVERITY: "How bad is the diarrhea?" "How many more stools have you had in the past 24 hours than normal?"    - NO DIARRHEA (SCALE 0)   - MILD (SCALE 1-3): Few loose or mushy BMs; increase of 1-3 stools over normal daily number of stools; mild increase in ostomy output.   -  MODERATE (SCALE 4-7): Increase of 4-6 stools daily over normal; moderate increase in ostomy output.   -  SEVERE (SCALE 8-10; OR "WORST POSSIBLE"): Increase of 7 or more stools daily over normal; moderate increase in ostomy output; incontinence.     Husband and pt on speaker phone.   Pt had a bout of incontinence of stool this morning.   "She didn't make it to the bathroom".    She's never had this happen before so wasn't sure what to do.    2. ONSET: "When did the diarrhea begin?"      Once this morning.    She didn't make it to the bathroom.   "We had a big mess to clean up".    3. BM CONSISTENCY: "How loose or watery is the diarrhea?"      diarrhea 4. VOMITING: "  Are you also vomiting?" If Yes, ask: "How many times in the past 24 hours?"      No 5. ABDOMEN PAIN: "Are you having any abdomen pain?" If Yes, ask: "What does it feel like?" (e.g., crampy, dull, intermittent, constant)      No 6. ABDOMEN PAIN SEVERITY: If present, ask: "How bad is the pain?"  (e.g., Scale 1-10; mild, moderate, or severe)   - MILD (1-3): doesn't interfere with normal activities, abdomen soft and not tender to touch    - MODERATE (4-7): interferes with normal activities or awakens from sleep, abdomen tender to touch    - SEVERE (8-10): excruciating pain, doubled over, unable to do any normal activities       No 7. ORAL INTAKE: If vomiting, "Have you been able to drink liquids?" "How much liquids have you had in the past 24 hours?"     N/A 8. HYDRATION: "Any signs of dehydration?" (e.g., dry mouth [not just dry lips], too weak to stand, dizziness, new weight loss) "When did you last urinate?"     No 9. EXPOSURE: "Have you traveled to a  foreign country recently?" "Have you been exposed to anyone with diarrhea?" "Could you have eaten any food that was spoiled?"     Not asked 10. ANTIBIOTIC USE: "Are you taking antibiotics now or have you taken antibiotics in the past 2 months?"       Not asked 11. OTHER SYMPTOMS: "Do you have any other symptoms?" (e.g., fever, blood in stool)       They called in seeking advice if she should do anything or be worried.    Since it only happened this once and she is not having any other GI symptoms I encouraged her to drink some extra fluids and to take whatever she would normally take for diarrhea.    I suggested Pepto Bismol or Imodium.     Husband stated,   "We don't have any of that in the house but I will go get some now and see how she does".    I gave them precautions to call back for.    Continued diarrhea over the next day or two even with the medication, dizziness, fever, blood in stool, abd pain.    They verbalized understanding.    12. PREGNANCY: "Is there any chance you are pregnant?" "When was your last menstrual period?"       N/A  Protocols used: Back Pain-A-AH, Diarrhea-A-AH

## 2023-11-01 NOTE — Telephone Encounter (Signed)
  Chief Complaint: diarrhea once this morning Symptoms: Incontinent of stool on the way to the bathroom this morning. Frequency: Just this morning Pertinent Negatives: Patient denies other symptoms, vomiting, fever, abd pain. Disposition: [] ED /[] Urgent Care (no appt availability in office) / [] Appointment(In office/virtual)/ []  [facility]/ [x] Home Care/ [] Refused Recommended Disposition /[] [place] [facility]/ []  Follow-up with PCP Additional Notes: Went over home care advice.

## 2023-11-02 ENCOUNTER — Ambulatory Visit: Payer: Medicare PPO

## 2023-11-02 ENCOUNTER — Telehealth: Payer: Self-pay | Admitting: Family Medicine

## 2023-11-02 DIAGNOSIS — M5459 Other low back pain: Secondary | ICD-10-CM

## 2023-11-02 NOTE — Telephone Encounter (Signed)
 Darryle Calkin PT [place] in [place]   Best contact: [phone]  PT had to send her home ongoing diarrhea, pt has been decreasing with her mobility in general over the past several appts, having more back pain, limited breathing. Things are not improving, getting worse

## 2023-11-02 NOTE — Therapy (Signed)
 [facility] Outpatient Rehabilitation at [facility] [address] [place], [place], [address] Phone: [phone]   Fax:  [phone]  Patient Details  Name: Kendra [name] MRN: [id number] Date of Birth: [birth year] Referring Provider:  Myrla Jon HERO, MD  Encounter Date: 11/02/2023  PT note opened/pt arrived, but no treatment provided today:  Pt caregiver reports to PT that pt is really hurting today. Pt reports she has been having diarrhea since yesterday, has had diarrhea 2x today. She is taking pepto bismol but states that it has not helped so far. She reports continued increased bilateral LBP. She says she is not feeling well. PT also agrees that pt should not complete session today due to current symptoms.   PT called pt's PCP office following encounter. PT provided information regarding pt's current symptoms, including information about ongoing increase in LBP and ongoing/chronic difficulty with breathlessness limiting interventions and worsening overall mobility.   Darryle JONELLE Patten, PT 11/02/2023, 12:15 PM  [facility] Outpatient Rehabilitation at [facility] [address] [place], [place], [address] Phone: [phone]   Fax:  [phone]

## 2023-11-04 NOTE — Telephone Encounter (Signed)
 Seeing patient tomorrow for f/u - will address.

## 2023-11-05 ENCOUNTER — Encounter: Payer: Self-pay | Admitting: Family Medicine

## 2023-11-05 ENCOUNTER — Telehealth: Payer: Self-pay | Admitting: Psychiatry

## 2023-11-05 ENCOUNTER — Ambulatory Visit: Payer: Medicare PPO | Admitting: Family Medicine

## 2023-11-05 VITALS — BP 121/68 | HR 88 | Ht 66.0 in | Wt 213.0 lb

## 2023-11-05 DIAGNOSIS — M545 Low back pain, unspecified: Secondary | ICD-10-CM

## 2023-11-05 DIAGNOSIS — Z23 Encounter for immunization: Secondary | ICD-10-CM

## 2023-11-05 DIAGNOSIS — R413 Other amnesia: Secondary | ICD-10-CM | POA: Diagnosis not present

## 2023-11-05 DIAGNOSIS — G2401 Drug induced subacute dyskinesia: Secondary | ICD-10-CM | POA: Diagnosis not present

## 2023-11-05 DIAGNOSIS — G8929 Other chronic pain: Secondary | ICD-10-CM | POA: Diagnosis not present

## 2023-11-05 DIAGNOSIS — E7849 Other hyperlipidemia: Secondary | ICD-10-CM

## 2023-11-05 DIAGNOSIS — R7303 Prediabetes: Secondary | ICD-10-CM | POA: Diagnosis not present

## 2023-11-05 DIAGNOSIS — H1013 Acute atopic conjunctivitis, bilateral: Secondary | ICD-10-CM

## 2023-11-05 DIAGNOSIS — I10 Essential (primary) hypertension: Secondary | ICD-10-CM | POA: Diagnosis not present

## 2023-11-05 MED ORDER — GABAPENTIN 300 MG PO CAPS: 300.0000 mg | ORAL_CAPSULE | Freq: Three times a day (TID) | ORAL | 1 refills | Status: DC

## 2023-11-05 NOTE — Assessment & Plan Note (Signed)
 Movement disorder symptoms resembling Parkinsonism, but negative biopsy for Parkinson's. Suspected tardive dyskinesia possibly related to previous Risperdal  exposure. Discussed potential benefits of switching from fluoxetine  to Cymbalta for mood management and pain relief.

## 2023-11-05 NOTE — Assessment & Plan Note (Signed)
 Chronic low back pain managed with acetaminophen  and gabapentin . Difficulty sleeping in bed due to pain, which intensifies with movement. Previous steroid injections at L2-L3 were ineffective. Discussed increasing gabapentin  dosage and alternative pain management options with Dr. Macario . Informed consent provided regarding risks and benefits of increasing gabapentin , including potential drowsiness. - Increase gabapentin  to 300 mg three times a day - Use up current 100 mg gabapentin  pills by taking three pills at a time - Send prescription for 300 mg gabapentin  pills - Refer to Dr. Naviera for further pain management options - Limit acetaminophen  to 1000 mg four times a day (maximum 4000 mg/day) - Discuss with Dr. Coby the possibility of switching from fluoxetine  to Cymbalta for dual benefits of mood management and pain relief

## 2023-11-05 NOTE — Telephone Encounter (Signed)
 Please contact patient and check with her if she wants to be seen sooner for medication changes if we have any slots open soon. I received a message from primary care provider regarding the need for a change of her medication to address her comorbid pain as well.

## 2023-11-05 NOTE — Assessment & Plan Note (Signed)
 Recommend low carb diet Recheck A1c

## 2023-11-05 NOTE — Assessment & Plan Note (Signed)
 Reviewed last lipid panel Not currently on a statin Recheck FLP and CMP Discussed diet and exercise

## 2023-11-05 NOTE — Progress Notes (Signed)
 Established patient visit   Patient: Kendra [name]   DOB: January 17, 1946   78 y.o. Female  MRN: [id number] Visit Date: 11/05/2023  Today's healthcare provider: Jon Eva, MD   Chief Complaint  Patient presents with   Medical Management of Chronic Issues    6 month follow-up. Patient also reports increased pain and states it is a 7/10 at the moment, when moving it would become worse.    Hypertension   Subjective    HPI HPI     Medical Management of Chronic Issues    Additional comments: 6 month follow-up. Patient also reports increased pain and states it is a 7/10 at the moment, when moving it would become worse.         Hypertension   Anxiety: Present.  Blurred vision: Absent.  Chest pain: Absent.  Chest pressure/discomfort: Present.  Dyspnea: Present.  Headaches: Absent.  Lower extremity edema: Present.  Orthopnea: Present.  Palpitations: Absent.  Paroxysmal nocturnal dyspnea: Absent.  Syncope: Absent.      Last edited by Lilian Fitzpatrick, CMA on 11/05/2023  9:06 AM.       Discussed the use of AI scribe software for clinical note transcription with the patient, who gave verbal consent to proceed.  History of Present Illness   Kendra [name], a patient with a history of movement disorders and chronic back pain, presents with worsening back pain that has not improved over time. The pain is severe enough to prevent her from sleeping in bed at night. She describes the pain as being located in the lower back, with a pulsating or intensifying sensation with movement. Previously, she experienced shooting pain, but this has since ceased. She has been managing her pain with acetaminophen  and gabapentin . The caregiver notes that the patient has been taking two 100mg  tablets of gabapentin  three times a day and two 500mg  tablets of acetaminophen  approximately every two hours. The patient denies feeling sluggish or drowsy from the gabapentin . The caregiver expresses increasing frustration  and irritability as the patient's condition worsens.         Medications: Outpatient Medications Prior to Visit  Medication Sig   Acetaminophen  500 MG capsule Take 1,000 mg by mouth 2 (two) times daily.   AMBULATORY NON FORMULARY MEDICATION 1 Bag by Other route daily. HERO smart dispenser   cyanocobalamin  (VITAMIN B12) 1000 MCG/ML injection INJECT 1 ML INTO THE MUSCLE EVERY 30 DAYS   denosumab  (PROLIA ) 60 MG/ML SOSY injection Inject 60 mg into the skin every 6 (six) months. takes annually   FLUoxetine  HCl 60 MG TABS Take 1 tablet by mouth daily.   losartan -hydrochlorothiazide  (HYZAAR) 50-12.5 MG tablet TAKE 1 TABLET BY MOUTH DAILY. STOP LISINOPRIL -HCTZ- THIS MEDICATION REPLACES THAT   nystatin  (MYCOSTATIN /NYSTOP ) powder APPLY TO AFFECTED AREA THREE TIMES A DAY   risperiDONE  (RISPERDAL ) 0.5 MG tablet TAKE 1 TABLET BY MOUTH TWICE A DAY   traZODone  (DESYREL ) 100 MG tablet Take 1 tablet (100 mg total) by mouth at bedtime.   [DISCONTINUED] gabapentin  (NEURONTIN ) 100 MG capsule Take 200 mg by mouth 3 (three) times daily.   [DISCONTINUED] carbidopa -levodopa  (SINEMET  IR) 25-100 MG tablet Take 2 tablets by mouth with breakfast, with lunch, and with evening meal. Start weaning   [DISCONTINUED] Carbidopa -Levodopa  ER (SINEMET  CR) 25-100 MG tablet controlled release Take 2 tablets by mouth at bedtime. Weaning off   [DISCONTINUED] mometasone (ELOCON) 0.1 % lotion Apply topically. (Patient not taking: Reported on 11/05/2023)   No facility-administered medications prior to visit.  Review of Systems     Objective    BP 121/68 (BP Location: Left Arm, Patient Position: Sitting, Cuff Size: Large)   Pulse 88   Ht 5' 6 (1.676 m)   Wt 213 lb (96.6 kg)   SpO2 96%   BMI 34.38 kg/m    Physical Exam Vitals reviewed.  Constitutional:      General: She is not in acute distress.    Appearance: She is well-developed. She is not diaphoretic.     Comments: In wheelchair  HENT:     Head: Normocephalic  and atraumatic.  Eyes:     General: No scleral icterus.    Conjunctiva/sclera: Conjunctivae normal.  Neck:     Thyroid : No thyromegaly.  Cardiovascular:     Rate and Rhythm: Normal rate and regular rhythm.     Heart sounds: Normal heart sounds.  Pulmonary:     Effort: Pulmonary effort is normal. No respiratory distress.     Breath sounds: Normal breath sounds. No wheezing, rhonchi or rales.  Musculoskeletal:     Cervical back: Neck supple.     Right lower leg: No edema.     Left lower leg: No edema.  Lymphadenopathy:     Cervical: No cervical adenopathy.  Skin:    General: Skin is warm and dry.     Findings: No rash.  Neurological:     Mental Status: She is alert. Mental status is at baseline.  Psychiatric:        Mood and Affect: Mood normal. Affect is flat.        Behavior: Behavior normal.      No results found for any visits on 11/05/23.  Assessment & Plan     Problem List Items Addressed This Visit       Cardiovascular and Mediastinum   Essential hypertension - Primary   Blood pressure well-controlled on current medication regimen. - Continue losartan /hydrochlorothiazide  50/12.5 mg once daily      Relevant Orders   Comprehensive metabolic panel     Nervous and Auditory   Tardive dyskinesia   Movement disorder symptoms resembling Parkinsonism, but negative biopsy for Parkinson's. Suspected tardive dyskinesia possibly related to previous Risperdal  exposure. Discussed potential benefits of switching from fluoxetine  to Cymbalta for mood management and pain relief.      Relevant Orders   AMB Referral [facility] Care Management     Other   Chronic low back pain (1ry area of Pain) (Bilateral) (L>R) w/o sciatica (Chronic)   Chronic low back pain managed with acetaminophen  and gabapentin . Difficulty sleeping in bed due to pain, which intensifies with movement. Previous steroid injections at L2-L3 were ineffective. Discussed increasing gabapentin  dosage and alternative  pain management options with Dr. Lulla . Informed consent provided regarding risks and benefits of increasing gabapentin , including potential drowsiness. - Increase gabapentin  to 300 mg three times a day - Use up current 100 mg gabapentin  pills by taking three pills at a time - Send prescription for 300 mg gabapentin  pills - Refer to Dr. Naviera for further pain management options - Limit acetaminophen  to 1000 mg four times a day (maximum 4000 mg/day) - Discuss with Dr. Coby the possibility of switching from fluoxetine  to Cymbalta for dual benefits of mood management and pain relief      Relevant Medications   gabapentin  (NEURONTIN ) 300 MG capsule   Other Relevant Orders   Ambulatory referral to Pain Clinic   Other hyperlipidemia   Reviewed last lipid panel Not currently on a statin  Recheck FLP and CMP Discussed diet and exercise       Relevant Orders   Comprehensive metabolic panel   Lipid panel   Allergic conjunctivitis of both eyes   Prediabetes   Recommend low carb diet Recheck A1c       Relevant Orders   Hemoglobin A1c   Memory impairment   Relevant Orders   AMB Referral [facility] Care Management   Other Visit Diagnoses       Immunization due       Relevant Orders   Flu Vaccine Trivalent High Dose (Fluad) (Completed)     Influenza vaccine needed       Relevant Orders   Flu Vaccine Trivalent High Dose (Fluad) (Completed)           Sleep Disturbance Unable to sleep in bed due to pain, currently sleeping in a chair. Sleep study recommended but postponed due to inability to stay in bed. Informed consent provided regarding benefits of conducting the sleep study even if sleeping in a chair. - Schedule sleep study to be conducted, even if sleeping in a chair (as ordered by other provider)  allergic conjunctivitis Persistent crustiness in the eyes. - Recommend over-the-counter Pataday eye drops, one drop in each eye once daily, preferably in the morning  Caregiver  Stress Caregiver reports increasing frustration and irritability. Discussed potential benefits of cognitive therapy and support groups, including [facility] online classes for caregivers of patients with chronic conditions. - Refer caregiver to child psychotherapist for resources and support - Consider [facility] online classes for caregivers of patients with chronic conditions  General Health Maintenance Routine health maintenance including vaccinations and screenings. - Administer flu vaccine - Order blood tests for kidney and liver function, cholesterol, and A1c  Follow-up - Schedule follow-up appointment in 4-6 weeks to assess response to increased gabapentin  - Review blood test results when available.        Return in about 4 weeks (around 12/03/2023) for chronic disease f/u.      Total time spent on today's visit was greater than 40 minutes, including both face-to-face time and nonface-to-face time personally spent on review of chart (labs and imaging), discussing labs and goals, discussing further work-up, treatment options, reviewing outside records, answering patient's questions, and coordinating care.    Jon Eva, MD  [facility] [phone] (phone) [phone] (fax)  [facility]

## 2023-11-05 NOTE — Assessment & Plan Note (Signed)
 Blood pressure well-controlled on current medication regimen. - Continue losartan/hydrochlorothiazide 50/12.5 mg once daily

## 2023-11-06 ENCOUNTER — Encounter: Payer: Self-pay | Admitting: Family Medicine

## 2023-11-06 ENCOUNTER — Other Ambulatory Visit: Payer: Self-pay | Admitting: Family Medicine

## 2023-11-06 LAB — COMPREHENSIVE METABOLIC PANEL
ALT: 22 [IU]/L (ref 0–32)
AST: 15 [IU]/L (ref 0–40)
Albumin: 4.4 g/dL (ref 3.8–4.8)
Alkaline Phosphatase: 66 [IU]/L (ref 44–121)
BUN/Creatinine Ratio: 8 — ABNORMAL LOW (ref 12–28)
BUN: 6 mg/dL — ABNORMAL LOW (ref 8–27)
Bilirubin Total: 0.3 mg/dL (ref 0.0–1.2)
CO2: 21 mmol/L (ref 20–29)
Calcium: 9.2 mg/dL (ref 8.7–10.3)
Chloride: 97 mmol/L (ref 96–106)
Creatinine, Ser: 0.72 mg/dL (ref 0.57–1.00)
Globulin, Total: 1.8 g/dL (ref 1.5–4.5)
Glucose: 111 mg/dL — ABNORMAL HIGH (ref 70–99)
Potassium: 3.9 mmol/L (ref 3.5–5.2)
Sodium: 134 mmol/L (ref 134–144)
Total Protein: 6.2 g/dL (ref 6.0–8.5)
eGFR: 87 mL/min/{1.73_m2} (ref >59)

## 2023-11-06 LAB — LIPID PANEL
Chol/HDL Ratio: 3 {ratio} (ref 0.0–4.4)
Cholesterol, Total: 171 mg/dL (ref 100–199)
HDL: 57 mg/dL (ref >39)
LDL Chol Calc (NIH): 93 mg/dL (ref 0–99)
Triglycerides: 115 mg/dL (ref 0–149)
VLDL Cholesterol Cal: 21 mg/dL (ref 5–40)

## 2023-11-06 LAB — HEMOGLOBIN A1C
Est. average glucose Bld gHb Est-mCnc: 123 mg/dL
Hgb A1c MFr Bld: 5.9 % — ABNORMAL HIGH (ref 4.8–5.6)

## 2023-11-08 ENCOUNTER — Telehealth: Payer: Self-pay | Admitting: Family Medicine

## 2023-11-08 ENCOUNTER — Telehealth: Payer: Self-pay | Admitting: *Deleted

## 2023-11-08 MED ORDER — LOSARTAN POTASSIUM-HCTZ 50-12.5 MG PO TABS: 1.0000 | ORAL_TABLET | Freq: Every day | ORAL | 1 refills | Status: DC

## 2023-11-08 NOTE — Telephone Encounter (Signed)
 Kendra [name] pharmacy faxed refill request for the following medications:   losartan-hydrochlorothiazide (HYZAAR) 50-12.5 MG tablet     Please advise

## 2023-11-08 NOTE — Progress Notes (Signed)
 Complex Care Management Note  Care Guide Note 11/08/2023 Name: Kendra [name] MRN: [id number] DOB: 1946/04/12  Kendra [name] is a 78 y.o. year old female who sees Bacigalupo, Jon HERO, MD for primary care. I reached out to [employer] by phone today to offer complex care management services.  [name] was given information about Complex Care Management services today including:   The Complex Care Management services include support from the care team which includes your Nurse Coordinator, Clinical Social Worker, or Pharmacist.  The Complex Care Management team is here to help remove barriers to the health concerns and goals most important to you. Complex Care Management services are voluntary, and the patient may decline or stop services at any time by request to their care team member.   Complex Care Management Consent Status: Patient agreed to services and verbal consent obtained.   Follow up plan:  Telephone appointment with complex care management team member scheduled for:  11/18/2023  Encounter Outcome:  Patient Scheduled  Thedford Franks, [facility] Care Coordination Care Guide Direct Dial: [phone]

## 2023-11-09 ENCOUNTER — Ambulatory Visit: Payer: Medicare PPO | Attending: Family Medicine

## 2023-11-09 DIAGNOSIS — R41841 Cognitive communication deficit: Secondary | ICD-10-CM | POA: Diagnosis not present

## 2023-11-09 DIAGNOSIS — R262 Difficulty in walking, not elsewhere classified: Secondary | ICD-10-CM | POA: Insufficient documentation

## 2023-11-09 DIAGNOSIS — M25552 Pain in left hip: Secondary | ICD-10-CM | POA: Insufficient documentation

## 2023-11-09 DIAGNOSIS — M542 Cervicalgia: Secondary | ICD-10-CM | POA: Diagnosis not present

## 2023-11-09 DIAGNOSIS — M6281 Muscle weakness (generalized): Secondary | ICD-10-CM | POA: Diagnosis not present

## 2023-11-09 DIAGNOSIS — R2681 Unsteadiness on feet: Secondary | ICD-10-CM | POA: Insufficient documentation

## 2023-11-09 DIAGNOSIS — M5459 Other low back pain: Secondary | ICD-10-CM | POA: Insufficient documentation

## 2023-11-09 NOTE — Therapy (Signed)
 OUTPATIENT PHYSICAL THERAPY TREATMENT       Patient Name: Kendra [name] MRN: [id number] DOB:1946-10-15, 78 y.o., female Today's Date: 11/09/2023  PCP: Myrla Jon HERO, MD REFERRING PROVIDER: Myrla Jon HERO, MD  END OF SESSION:  PT End of Session - 11/09/23 1314     Visit Number 76    Number of Visits 80    Date for PT Re-Evaluation 11/23/23    Authorization Type [employer] PPO    Authorization Time Period 06/10/23-08/05/23    Progress Note Due on Visit 70    PT Start Time 1153    PT Stop Time 1229    PT Time Calculation (min) 36 min    Equipment Utilized During Treatment Gait belt    Activity Tolerance Patient tolerated treatment well;Patient limited by fatigue;Patient limited by pain    Behavior During Therapy [facility] for tasks assessed/performed                     Past Medical History:  Diagnosis Date   Anxiety    Breast cancer (HCC) 03/2018   right breast cancer at 11:00 and 1:00   Bursitis    bilateral hips and knees   Complication of anesthesia    Depression    Family history of breast cancer 03/24/2018   Family history of lung cancer 03/24/2018   History of alcohol dependence (HCC) 2007   no ETOH; resolved since 2007   History of hiatal hernia    History of psychosis 2014   due to sleep disturbance   Hypertension    Parkinson disease (HCC)    Personal history of radiation therapy 06/2018-07/2018   right breast ca   PONV (postoperative nausea and vomiting)    Past Surgical History:  Procedure Laterality Date   BREAST BIOPSY Right 03/14/2018   11:00 DCIS and invasive ductal carcinoma   BREAST BIOPSY Right 03/14/2018   1:00 Invasive ductal carcinoma   BREAST LUMPECTOMY Right 04/27/2018   lumpectomy of 11 and 1:00 cancers, clear margins, negative LN   BREAST LUMPECTOMY WITH RADIOACTIVE SEED AND SENTINEL LYMPH NODE BIOPSY Right 04/27/2018   Procedure: RIGHT BREAST LUMPECTOMY WITH RADIOACTIVE SEED X 2 AND RIGHT SENTINEL LYMPH  NODE BIOPSY;  Surgeon: Mikell Katz, MD;  Location: [facility];  Service: General;  Laterality: Right;   CATARACT EXTRACTION W/PHACO Left 08/30/2019   Procedure: CATARACT EXTRACTION PHACO AND INTRAOCULAR LENS PLACEMENT (IOC) LEFT panoptix toric  01:20.5  12.7%  10.26;  Surgeon: Mittie Gaskin, MD;  Location: [facility];  Service: Ophthalmology;  Laterality: Left;   CATARACT EXTRACTION W/PHACO Right 09/20/2019   Procedure: CATARACT EXTRACTION PHACO AND INTRAOCULAR LENS PLACEMENT (IOC) RIGHT 5.71  00:36.4  15.7%;  Surgeon: Mittie Gaskin, MD;  Location: [facility];  Service: Ophthalmology;  Laterality: Right;   LAPAROTOMY N/A 02/10/2018   Procedure: EXPLORATORY LAPAROTOMY;  Surgeon: Anitra Freddy NOVAK, MD;  Location: [facility];  Service: Gynecology;  Laterality: N/A;   MASS EXCISION  01/2018   abdominal   OVARIAN CYST REMOVAL     SALPINGOOPHORECTOMY Bilateral 02/10/2018   Procedure: BILATERAL SALPINGO OOPHORECTOMY; PERITONEAL WASHINGS;  Surgeon: Anitra Freddy NOVAK, MD;  Location: [facility];  Service: Gynecology;  Laterality: Bilateral;   TONSILLECTOMY     TONSILLECTOMY AND ADENOIDECTOMY  1953   Patient Active Problem List   Diagnosis Date Noted   Excessive daytime sleepiness 10/16/2023   Restless leg syndrome 10/16/2023   Memory impairment 10/16/2023   Tardive dyskinesia 09/09/2023  Snoring 09/09/2023   Neurogenic bladder 05/14/2023   Prediabetes 12/15/2022   Chronic cough 08/17/2022   History of allergy to shellfish 02/24/2022   Chronic low back pain of over 3 months duration 02/04/2022   Osteoarthritis of  AC (acromioclavicular) joint (Left) 02/04/2022   Osteoarthritis of hip (Right) 02/04/2022   Impaction fracture of distal end of radius and ulna, sequela (Left) 02/04/2022   Osteoarthritis of 1st (thumb) carpometacarpal (CMC) joint of hand (Left) 02/04/2022   Lumbosacral facet arthropathy (Multilevel) (Bilateral) (T12-S1) 02/04/2022   Lumbar  central spinal stenosis w/o neurogenic claudication (L2-3, L3-4, L4-5) 02/04/2022   Lumbar foraminal stenosis (Bilateral: L2-3, L4-5) (Right: (Severe) L4-5) 02/04/2022   Lumbar lateral recess stenosis (Bilateral: L2-3, L3-4, and L4-5) (Severe) 02/04/2022   DDD (degenerative disc disease), lumbar 02/04/2022   Lumbosacral facet syndrome (Multilevel) (Bilateral) 02/04/2022   Osteoarthritis of lumbar spine 02/04/2022   Allergic conjunctivitis of both eyes 12/23/2021   Chronic hip pain (3ry area of Pain) (Bilateral) (L>R) 12/22/2021   Chronic lower extremity pain (2ry area of Pain) (Bilateral) (L>R) 12/22/2021   Abnormal MRI, lumbar spine (01/31/2020) 12/22/2021   Grade 1 Anterolisthesis of lumbar spine (L3/L4) & (5 mm) (L4/L5) 12/22/2021   Chronic pain syndrome 11/17/2021   High risk medication use 11/17/2021   Disorder of skeletal system 11/17/2021   Problems influencing health status 11/17/2021   Other hyperlipidemia 10/21/2021   Polypharmacy 08/12/2021   Chronic low back pain (1ry area of Pain) (Bilateral) (L>R) w/o sciatica 06/18/2021   Osteopenia of neck of femur (Right) 09/06/2020   Obesity 08/29/2020   Parkinson disease (HCC) 02/22/2020   Mucinous cystadenoma 12/24/2019   Bipolar disorder, in full remission, most recent episode depressed (HCC) 11/08/2019   Vitamin D  insufficiency 08/24/2019   Insomnia 05/31/2019   Bipolar I disorder, most recent episode depressed (HCC) 05/31/2019   Alcohol use disorder, moderate, in sustained remission (HCC) 05/31/2019   Elevated tumor markers 05/27/2019   GAD (generalized anxiety disorder) 04/10/2019   MDD (major depressive disorder), severe (HCC) 01/31/2019   High serum carbohydrate antigen 19-9 (CA19-9) 12/01/2018   Adjustment disorder with anxious mood 11/24/2018   Essential hypertension 11/24/2018   B12 deficiency 10/17/2018   Incisional hernia, without obstruction or gangrene 09/12/2018   Genetic testing 04/07/2018   Family history of  breast cancer 03/24/2018   Family history of lung cancer 03/24/2018   Malignant neoplasm of upper-outer quadrant of right breast in female, estrogen receptor positive (HCC) 03/17/2018   Malignant neoplasm of upper-inner quadrant of right breast in female, estrogen receptor positive (HCC) 03/17/2018   History of psychosis 02/07/2018   History of insomnia 02/07/2018    ONSET DATE: Pt diagnosed with PD in 2019  REFERRING DIAG:  G20 (ICD-10-CM) - Parkinson's disease      THERAPY DIAG:  Other low back pain  Muscle weakness (generalized)  Difficulty in walking, not elsewhere classified  Unsteadiness on feet  Rationale for Evaluation and Treatment: Rehabilitation  SUBJECTIVE:   Pt reports continued high levels of back pain, she also reports continued hip pain on R side. She reports no falls. She recently saw her doctor.   PERTINENT HISTORY:  Pt is a pleasant 78 yo female who was diagnosed with PD in 2019. She is known to clinic and dino, and was last seen for LSVT BIG Sept-Oct 2022. Pt with chronic pain and has become increasingly sedentary as a result exacerbating mobility difficulties due to PD. Current concerns: Pt reports L and R hip pain and back  pain. Pt was being seen by pain management clinic. Pt has had injections that provided relief at the time. Pt's spouse reports her physician determined surgery was not an option for her pain so the plan is to manage it. Pt spouse thinks pain has resulted in reluctance for pt to move. Pt currently sedentary, doesn't walk much, but when she does she holds on to furniture for support due to balance problems. When pt first stands she must hold on to something due to mix of pain and/or dizziness (described as lightheadedness). Her pain can go from 3/10 to 9/10. It does improve once she starts moving. Pt has increased difficulty with multiple activities including: getting in and out of her bed, getting in and out of her car, freezing, decreased  gait speed, decreased balance and cognition, and difficulty using BUE due to tremors.  Per chart other PMH includes hx of breast cancer, HTN, MCI, insomnia, cataracts, bipolar 1, alcohol use disorder, GAD, MDD, osteopenia of neck of R femur, chronic B low back pain with radiating pain to buttock and posterior thighs and calves. Pt with multiple comorbidities, please refer to chart for full details    PAIN: from eval Are you having pain? Yes 7/10 Left hip   PRECAUTIONS: Fall  WEIGHT BEARING RESTRICTIONS: No  FALLS: Has patient fallen in last 6 months? Yes. Number of falls 1x, leg tangled in chair leg when getting up from a chair  PLOF: Independent with basic ADLs  PATIENT GOALS: improve mobility  OBJECTIVE   TODAY'S TREATMENT:                                                                                                                                         DATE: 11/09/23   Gait belt donned throughout for pt safety  NMR:  Large amplitude movement training STS with bilat UE abduction 6x 5x, 4x -  hard  Seated FWD reach>downward reach>reach up>reach out to the side 6x, 10x  Seated big cross body twist and clap 2x10 each way  STS with bilat UE abduction  2x5  TE:  Ambulation for endurance 3x148 ft. Pt requests rest breaks following each set due to fatigue, rest breaks provided      PATIENT EDUCATION:  Education details: Pt educated throughout session about proper posture and technique with exercises. Improved exercise technique, movement at target joints, use of target muscles after min to mod verbal, visual, tactile cues.  Person educated: Patient Education method: Explanation, Demonstration, Tactile cues, and Verbal cues Education comprehension: verbalized understanding, returned demonstration, verbal cues required, tactile cues required, and needs further education    HOME EXERCISE PROGRAM:   8//20/24: Access Code: [email or web address] URL:  [email or web address] Date: 06/22/2023 Prepared by: Darryle Patten  Exercises - Sit to Stand with Counter Support  - 1 x daily - 4-6 x weekly - 2 sets - 10 reps - Seated Long Arc Quad  -  1 x daily - 4-6 x weekly - 2 sets - 12 reps - Seated Heel Raise  - 1 x daily - 4-6 x weekly - 1 sets - 20 reps    GOALS: Goals reviewed with patient? Yes  SHORT TERM GOALS: Target date: 04/27/2023    Patient will be independent in home exercise program to improve strength/mobility for better functional independence with ADLs. Baseline: LSVT BIG protocol to be initiated next session; 12/18: not yet fully indep, reports she still gets confused about the technique; 12/03/2022: HEP to be updated; Pt reports practicing HEP 2x/week; 02/23/23: not currently performing HEP, reviewed in session and provided new copy; 03/16/2023: pt reports performing HEP every day; 03/30/23: Pt reports she is not being consistent with her HEP; 7/1: pt reports she is not doing much at home; 06/10/23: pt unsure how often she is exercising at home; 10/8: pt repots unsure about her HEP Goal status: MET (previously MET)   LONG TERM GOALS: Target date: 06/08/2023     Patient will increase FOTO score to equal to or greater than 49 to demonstrate statistically significant improvement in mobility and quality of life.  Baseline: 40; 12/18: deferred due to time; 45; 3/19: deferred to next visit; 01/21/2023; 02/23/2023: 46 unchanged from previous assessment); 03/17/2023: 48; 06/10/23: 52 Goal status: MET  2.  Patient (> 64 years old) will complete five times sit to stand test in < 15 seconds indicating decreased fall risk.  Baseline: 27 sec; 12/18 23 sec hands-free; 11/04/22: 24 sec hands free; 2/1/124: 22.7 sec use of BUE, 21.3 sec hands-free; 01/19/23: 18 seconds hands-free; 02/23/23: 19 seconds hands-free; 03/16/2023: 18 seconds hands-free; 03/30/23: 17.3 seconds hands-free; 05/03/23: 17.3 seconds hands-free; 06/10/23: 19 seconds hands-free; 10/8:  16 seconds hands-free; 09/21/23: 17 sec hands-free; 10/12/23: 20 sec hands-free Goal status: PARTIALLY MET  3.  Patient will increase 10 meter walk test to >1.47m/s with WFL B step-length, upright posture for >90% of test to improve ease with gait and to override hypokentic and bradykinetic movement.  Baseline: 0.58 m/s with 5TT; 12/18: 0.7 m/s with 5TT; 11/04/22: 0.69 m/s with 5TT;  12/03/22: 0.73 m/s with 5TT; 01/19/2023: 0.77 m/s with 5TT; 02/23/2023: 0.83 m/s with 5TT; 03/16/2023: 0.83 m/s with 5TT , pt still somewhat crouched, requires cues for upright posture >50% of time; 03/30/23: 0.83 m/s with crouched posture; 05/03/23 0.8 m/s with RW, some improvement with upright posture; 06/10/23:  0.77 m/s with 5TT and crouched posture; 10/8: 0.74 m/s 4WW; 09/21/23: 0.71 m/s with 5TT; 12/10:  0.57 m/s with 4WW Goal status: IN PROGRESS  4.  Patient will reduce time to don jacket by at least 5 seconds in order to increase ease with dressing and override bradykinetic movement Baseline: 17 seconds; 12/18: 12 seconds Goal status: MET  5.  Pt will demo or report ability to open standard jar using only 1-2 attempts in order to exhibit improved functional mobility for ADLs.  Baseline: 5.7 seconds with multiple attempts to turn jar lid to open; 12/18: 2.8 sec opens in 2 turns Goal status: MET  6.  Patient will increase Berg Balance score by > 6 points to demonstrate decreased fall risk during functional activities. Baseline: to be tested next 1-2 visits; 12/03/2022: 44/56; 01/21/23 45/56; 48/56; 03/17/2023: 48/56; 05/03/23: 49/43; 8/8: 48/56; 08/10/2023: deferred; 11/19: 51/56  Goal status: GOAL MET  7.  Patient will report at least 4 days in a row with minimal to no L hip pain in order to improve QOL and ease/safety with  functional mobility. Baseline: Pt reports pain majority of days in L hip; 12/03/2022: pt reports having hip pain about every day; 3/19: rates hip pain daily (being seen now by MD, new imaging); 02/23/2023: pt  went for period of time without hip pain (she thinks a week), but reports it has now come back,; 5/28: pt reports hip pain every other day; 05/03/23: pt reports pain in hip every day in past week Goal status: NOT MET/discontinued   8. Patient will increase six minute walk test distance to >1000 for progression to community ambulator and improve gait ability Baseline:02/23/2023: 722 ft with 4WW and two standing rest breaks, rates test as hard; 03/16/2023: deferred to next visit due to time; 5/28/20204: 641 ft with 4WW, pt requests to end test early due to fatigue at 5 min 35 sec; 05/03/23: 645 ft with 4WW and able to complete full test; 06/10/23: 708 ft; 08/10/23: 632 ft with 3 rest breaks; 11/19: 592 ft with 4WW, pt requests to end test early at about 5 min due to fatigue; 10/21/23: Pt completes full 6 minutes with 4 standing rest breaks, ambulates total of 535 ft with 4WW  Goal status: IN PROGRESS   ASSESSMENT:  Pt returns today feeling better, but still quick to fatigue and limited with interventions due to ongoing back pain (physician aware). PT modified interventions throughout to promote pt comfort. Plan to start working on HEP review next few visits. The pt will benefit from one more cert of skilled PT to improve balance, gait and mobility.  OBJECTIVE IMPAIRMENTS: Abnormal gait, decreased balance, decreased cognition, decreased coordination, decreased endurance, decreased knowledge of use of DME, decreased mobility, difficulty walking, decreased strength, dizziness, hypomobility, impaired UE functional use, improper body mechanics, postural dysfunction, and pain.   ACTIVITY LIMITATIONS: lifting, bending, standing, squatting, stairs, transfers, bed mobility, bathing, dressing, hygiene/grooming, and locomotion level  PARTICIPATION LIMITATIONS: meal prep, cleaning, laundry, medication management, personal finances, driving, shopping, community activity, and yard work  PERSONAL FACTORS: Age, Fitness,  Sex, Time since onset of injury/illness/exacerbation, and 3+ comorbidities: PMH includes hx of breast cancer, HTN, MCI, insomnia, cataracts, bipolar 1, alcohol use disorder, GAD, MDD, osteopenia of neck of R femur, chronic B low back pain with radiating pain to buttock and posterior thighs and calves. Pt with multiple comorbidities, please refer to chart for full details  are also affecting patient's functional outcome.   REHAB POTENTIAL: Fair    CLINICAL DECISION MAKING: Evolving/moderate complexity  EVALUATION COMPLEXITY: High  PLAN:  PT FREQUENCY: 1-2x/week  PT DURATION: 6 weeks     PLANNED INTERVENTIONS: Therapeutic exercises, Therapeutic activity, Neuromuscular re-education, Balance training, Gait training, Patient/Family education, Self Care, Joint mobilization, Stair training, Vestibular training, Canalith repositioning, Visual/preceptual remediation/compensation, Orthotic/Fit training, DME instructions, Wheelchair mobility training, Spinal mobilization, Cryotherapy, Moist heat, Splintting, Taping, and Manual therapy  PLAN FOR NEXT SESSION: endurance, higher level balance in // bars, strength   Darryle Patten PT, DPT  Physical Therapist - [facility]  [facility]  11/09/2023, 1:18 PM

## 2023-11-09 NOTE — Telephone Encounter (Signed)
 Disp Refills Start End   losartan -hydrochlorothiazide  (HYZAAR) 50-12.5 MG tablet 90 tablet 1 11/08/2023 --   Sig - Route: Take 1 tablet by mouth daily. - Oral   Sent to pharmacy as: losartan -hydrochlorothiazide  (HYZAAR) 50-12.5 MG tablet   E-Prescribing Status: Receipt confirmed by pharmacy (11/08/2023 12:36 PM EST)     Requested Prescriptions  Pending Prescriptions Disp Refills   losartan -hydrochlorothiazide  (HYZAAR) 50-12.5 MG tablet [Pharmacy Med Name: LOSARTAN -HCTZ 50-12.5 MG TAB] 90 tablet 0    Sig: TAKE 1 TABLET BY MOUTH DAILY *STOP LISINOPRIL -HCTZ *     Cardiovascular: ARB + Diuretic Combos Passed - 11/09/2023 12:58 PM      Passed - K in normal range and within 180 days    Potassium  Date Value Ref Range Status  11/05/2023 3.9 3.5 - 5.2 mmol/L Final         Passed - Na in normal range and within 180 days    Sodium  Date Value Ref Range Status  11/05/2023 134 134 - 144 mmol/L Final         Passed - Cr in normal range and within 180 days    Creatinine  Date Value Ref Range Status  08/31/2023 0.70 0.44 - 1.00 mg/dL Final   Creatinine, Ser  Date Value Ref Range Status  11/05/2023 0.72 0.57 - 1.00 mg/dL Final         Passed - eGFR is 10 or above and within 180 days    GFR, Est AFR Am  Date Value Ref Range Status  03/23/2018 >60 >60 mL/min Final    Comment:    (NOTE) The eGFR has been calculated using the CKD EPI equation. This calculation has not been validated in all clinical situations. eGFR's persistently <60 mL/min signify possible Chronic Kidney Disease.    GFR calc Af Amer  Date Value Ref Range Status  04/29/2020 >60 >60 mL/min Final   GFR, Estimated  Date Value Ref Range Status  08/31/2023 >60 >60 mL/min Final    Comment:    (NOTE) Calculated using the CKD-EPI Creatinine Equation (2021)    eGFR  Date Value Ref Range Status  11/05/2023 87 >59 mL/min/1.73 Final         Passed - Patient is not pregnant      Passed - Last BP in normal range    BP  Readings from Last 1 Encounters:  11/05/23 121/68         Passed - Valid encounter within last 6 months    Recent Outpatient Visits           4 days ago Essential hypertension   [place] [facility] [place], Jon HERO, MD   6 months ago Essential hypertension   [facility] [place], Jon HERO, MD   9 months ago Encounter for annual wellness visit (AWV) in Medicare patient   [facility] [place], Jon HERO, MD   10 months ago Frequent urination   [facility] [place], Jon HERO, MD   1 year ago Essential hypertension   [facility] [place], Jon HERO, MD       Future Appointments             In 2 weeks Bac[place]al[place], Jon HERO, MD [facility]

## 2023-11-15 ENCOUNTER — Ambulatory Visit: Payer: Medicare PPO | Admitting: Family Medicine

## 2023-11-15 ENCOUNTER — Telehealth: Payer: Self-pay | Admitting: Psychiatry

## 2023-11-15 DIAGNOSIS — L219 Seborrheic dermatitis, unspecified: Secondary | ICD-10-CM | POA: Diagnosis not present

## 2023-11-15 DIAGNOSIS — G2401 Drug induced subacute dyskinesia: Secondary | ICD-10-CM | POA: Diagnosis not present

## 2023-11-15 DIAGNOSIS — W19XXXA Unspecified fall, initial encounter: Secondary | ICD-10-CM | POA: Diagnosis not present

## 2023-11-15 DIAGNOSIS — R0609 Other forms of dyspnea: Secondary | ICD-10-CM | POA: Diagnosis not present

## 2023-11-15 DIAGNOSIS — G8929 Other chronic pain: Secondary | ICD-10-CM | POA: Diagnosis not present

## 2023-11-15 DIAGNOSIS — G20C Parkinsonism, unspecified: Secondary | ICD-10-CM | POA: Diagnosis not present

## 2023-11-15 DIAGNOSIS — M542 Cervicalgia: Secondary | ICD-10-CM | POA: Diagnosis not present

## 2023-11-15 DIAGNOSIS — R4189 Other symptoms and signs involving cognitive functions and awareness: Secondary | ICD-10-CM | POA: Diagnosis not present

## 2023-11-15 DIAGNOSIS — Z79899 Other long term (current) drug therapy: Secondary | ICD-10-CM | POA: Diagnosis not present

## 2023-11-15 DIAGNOSIS — F319 Bipolar disorder, unspecified: Secondary | ICD-10-CM | POA: Diagnosis not present

## 2023-11-15 NOTE — Telephone Encounter (Signed)
 Please clarify with patient/spouse as to what they need.  Routing to CMA.

## 2023-11-16 ENCOUNTER — Telehealth: Payer: Self-pay

## 2023-11-16 ENCOUNTER — Ambulatory Visit: Payer: Medicare PPO

## 2023-11-16 DIAGNOSIS — R262 Difficulty in walking, not elsewhere classified: Secondary | ICD-10-CM

## 2023-11-16 DIAGNOSIS — R2681 Unsteadiness on feet: Secondary | ICD-10-CM

## 2023-11-16 DIAGNOSIS — M542 Cervicalgia: Secondary | ICD-10-CM | POA: Diagnosis not present

## 2023-11-16 DIAGNOSIS — G8929 Other chronic pain: Secondary | ICD-10-CM | POA: Diagnosis not present

## 2023-11-16 DIAGNOSIS — M25552 Pain in left hip: Secondary | ICD-10-CM | POA: Diagnosis not present

## 2023-11-16 DIAGNOSIS — M5459 Other low back pain: Secondary | ICD-10-CM | POA: Diagnosis not present

## 2023-11-16 DIAGNOSIS — Z79899 Other long term (current) drug therapy: Secondary | ICD-10-CM | POA: Diagnosis not present

## 2023-11-16 DIAGNOSIS — W19XXXA Unspecified fall, initial encounter: Secondary | ICD-10-CM | POA: Diagnosis not present

## 2023-11-16 DIAGNOSIS — G20C Parkinsonism, unspecified: Secondary | ICD-10-CM | POA: Diagnosis not present

## 2023-11-16 DIAGNOSIS — M6281 Muscle weakness (generalized): Secondary | ICD-10-CM | POA: Diagnosis not present

## 2023-11-16 DIAGNOSIS — R0609 Other forms of dyspnea: Secondary | ICD-10-CM | POA: Diagnosis not present

## 2023-11-16 DIAGNOSIS — G2401 Drug induced subacute dyskinesia: Secondary | ICD-10-CM | POA: Diagnosis not present

## 2023-11-16 DIAGNOSIS — F319 Bipolar disorder, unspecified: Secondary | ICD-10-CM | POA: Diagnosis not present

## 2023-11-16 DIAGNOSIS — R41841 Cognitive communication deficit: Secondary | ICD-10-CM | POA: Diagnosis not present

## 2023-11-16 DIAGNOSIS — R4189 Other symptoms and signs involving cognitive functions and awareness: Secondary | ICD-10-CM | POA: Diagnosis not present

## 2023-11-16 DIAGNOSIS — L219 Seborrheic dermatitis, unspecified: Secondary | ICD-10-CM | POA: Diagnosis not present

## 2023-11-16 DIAGNOSIS — F411 Generalized anxiety disorder: Secondary | ICD-10-CM

## 2023-11-16 MED ORDER — FLUOXETINE HCL 60 MG PO TABS: 1.0000 | ORAL_TABLET | Freq: Every day | ORAL | 3 refills | Status: DC

## 2023-11-16 NOTE — Telephone Encounter (Signed)
 I have sent fluoxetine to pharmacy-90 days with refills.

## 2023-11-16 NOTE — Telephone Encounter (Signed)
 received fax requesting a 90 day supply of the fluoxetine hcl 60mg . pt was last seen on 11-7 next appt 2-11

## 2023-11-16 NOTE — Telephone Encounter (Signed)
 PT's husband called back to see if there was an earlier appt than her 2/11th appt. There was not an opening. I clarified w/ husband as to why you wanted to see PT earlier. Husband has been speaking with 2-3 women with similar names and is confused. PT to be put on wait list incase earlier appt comes up.

## 2023-11-16 NOTE — Therapy (Signed)
 OUTPATIENT PHYSICAL THERAPY TREATMENT       Patient Name: Kendra [name] MRN: [id number] DOB:November 21, 1945, 78 y.o., female Today's Date: 11/16/2023  PCP: Myrla Jon HERO, MD REFERRING PROVIDER: Myrla Jon HERO, MD  END OF SESSION:  PT End of Session - 11/16/23 1144     Visit Number 77    Number of Visits 80    Date for PT Re-Evaluation 11/23/23    Authorization Type [employer] PPO    Authorization Time Period 06/10/23-08/05/23    Progress Note Due on Visit 70    PT Start Time 1147    PT Stop Time 1230    PT Time Calculation (min) 43 min    Equipment Utilized During Treatment Gait belt    Activity Tolerance Patient tolerated treatment well;Patient limited by fatigue;Patient limited by pain    Behavior During Therapy [facility] for tasks assessed/performed                     Past Medical History:  Diagnosis Date   Anxiety    Breast cancer (HCC) 03/2018   right breast cancer at 11:00 and 1:00   Bursitis    bilateral hips and knees   Complication of anesthesia    Depression    Family history of breast cancer 03/24/2018   Family history of lung cancer 03/24/2018   History of alcohol dependence (HCC) 2007   no ETOH; resolved since 2007   History of hiatal hernia    History of psychosis 2014   due to sleep disturbance   Hypertension    Parkinson disease (HCC)    Personal history of radiation therapy 06/2018-07/2018   right breast ca   PONV (postoperative nausea and vomiting)    Past Surgical History:  Procedure Laterality Date   BREAST BIOPSY Right 03/14/2018   11:00 DCIS and invasive ductal carcinoma   BREAST BIOPSY Right 03/14/2018   1:00 Invasive ductal carcinoma   BREAST LUMPECTOMY Right 04/27/2018   lumpectomy of 11 and 1:00 cancers, clear margins, negative LN   BREAST LUMPECTOMY WITH RADIOACTIVE SEED AND SENTINEL LYMPH NODE BIOPSY Right 04/27/2018   Procedure: RIGHT BREAST LUMPECTOMY WITH RADIOACTIVE SEED X 2 AND RIGHT SENTINEL LYMPH  NODE BIOPSY;  Surgeon: Mikell Katz, MD;  Location: [facility];  Service: General;  Laterality: Right;   CATARACT EXTRACTION W/PHACO Left 08/30/2019   Procedure: CATARACT EXTRACTION PHACO AND INTRAOCULAR LENS PLACEMENT (IOC) LEFT panoptix toric  01:20.5  12.7%  10.26;  Surgeon: Mittie Gaskin, MD;  Location: [facility];  Service: Ophthalmology;  Laterality: Left;   CATARACT EXTRACTION W/PHACO Right 09/20/2019   Procedure: CATARACT EXTRACTION PHACO AND INTRAOCULAR LENS PLACEMENT (IOC) RIGHT 5.71  00:36.4  15.7%;  Surgeon: Mittie Gaskin, MD;  Location: [facility];  Service: Ophthalmology;  Laterality: Right;   LAPAROTOMY N/A 02/10/2018   Procedure: EXPLORATORY LAPAROTOMY;  Surgeon: Anitra Freddy NOVAK, MD;  Location: [facility];  Service: Gynecology;  Laterality: N/A;   MASS EXCISION  01/2018   abdominal   OVARIAN CYST REMOVAL     SALPINGOOPHORECTOMY Bilateral 02/10/2018   Procedure: BILATERAL SALPINGO OOPHORECTOMY; PERITONEAL WASHINGS;  Surgeon: Anitra Freddy NOVAK, MD;  Location: [facility];  Service: Gynecology;  Laterality: Bilateral;   TONSILLECTOMY     TONSILLECTOMY AND ADENOIDECTOMY  1953   Patient Active Problem List   Diagnosis Date Noted   Excessive daytime sleepiness 10/16/2023   Restless leg syndrome 10/16/2023   Memory impairment 10/16/2023   Tardive dyskinesia 09/09/2023  Snoring 09/09/2023   Neurogenic bladder 05/14/2023   Prediabetes 12/15/2022   Chronic cough 08/17/2022   History of allergy to shellfish 02/24/2022   Chronic low back pain of over 3 months duration 02/04/2022   Osteoarthritis of  AC (acromioclavicular) joint (Left) 02/04/2022   Osteoarthritis of hip (Right) 02/04/2022   Impaction fracture of distal end of radius and ulna, sequela (Left) 02/04/2022   Osteoarthritis of 1st (thumb) carpometacarpal (CMC) joint of hand (Left) 02/04/2022   Lumbosacral facet arthropathy (Multilevel) (Bilateral) (T12-S1) 02/04/2022   Lumbar  central spinal stenosis w/o neurogenic claudication (L2-3, L3-4, L4-5) 02/04/2022   Lumbar foraminal stenosis (Bilateral: L2-3, L4-5) (Right: (Severe) L4-5) 02/04/2022   Lumbar lateral recess stenosis (Bilateral: L2-3, L3-4, and L4-5) (Severe) 02/04/2022   DDD (degenerative disc disease), lumbar 02/04/2022   Lumbosacral facet syndrome (Multilevel) (Bilateral) 02/04/2022   Osteoarthritis of lumbar spine 02/04/2022   Allergic conjunctivitis of both eyes 12/23/2021   Chronic hip pain (3ry area of Pain) (Bilateral) (L>R) 12/22/2021   Chronic lower extremity pain (2ry area of Pain) (Bilateral) (L>R) 12/22/2021   Abnormal MRI, lumbar spine (01/31/2020) 12/22/2021   Grade 1 Anterolisthesis of lumbar spine (L3/L4) & (5 mm) (L4/L5) 12/22/2021   Chronic pain syndrome 11/17/2021   High risk medication use 11/17/2021   Disorder of skeletal system 11/17/2021   Problems influencing health status 11/17/2021   Other hyperlipidemia 10/21/2021   Polypharmacy 08/12/2021   Chronic low back pain (1ry area of Pain) (Bilateral) (L>R) w/o sciatica 06/18/2021   Osteopenia of neck of femur (Right) 09/06/2020   Obesity 08/29/2020   Parkinson disease (HCC) 02/22/2020   Mucinous cystadenoma 12/24/2019   Bipolar disorder, in full remission, most recent episode depressed (HCC) 11/08/2019   Vitamin D  insufficiency 08/24/2019   Insomnia 05/31/2019   Bipolar I disorder, most recent episode depressed (HCC) 05/31/2019   Alcohol use disorder, moderate, in sustained remission (HCC) 05/31/2019   Elevated tumor markers 05/27/2019   GAD (generalized anxiety disorder) 04/10/2019   MDD (major depressive disorder), severe (HCC) 01/31/2019   High serum carbohydrate antigen 19-9 (CA19-9) 12/01/2018   Adjustment disorder with anxious mood 11/24/2018   Essential hypertension 11/24/2018   B12 deficiency 10/17/2018   Incisional hernia, without obstruction or gangrene 09/12/2018   Genetic testing 04/07/2018   Family history of  breast cancer 03/24/2018   Family history of lung cancer 03/24/2018   Malignant neoplasm of upper-outer quadrant of right breast in female, estrogen receptor positive (HCC) 03/17/2018   Malignant neoplasm of upper-inner quadrant of right breast in female, estrogen receptor positive (HCC) 03/17/2018   History of psychosis 02/07/2018   History of insomnia 02/07/2018    ONSET DATE: Pt diagnosed with PD in 2019  REFERRING DIAG:  G20 (ICD-10-CM) - Parkinson's disease      THERAPY DIAG:  Other low back pain  Cervicalgia  Muscle weakness (generalized)  Difficulty in walking, not elsewhere classified  Unsteadiness on feet  Rationale for Evaluation and Treatment: Rehabilitation  SUBJECTIVE:   Pt reports 1 fall since last seen (has since seen her physician following fall). She reports she fell backwards after standing up from a chair, but unsure of other details surrounding her fall. She has had some neck pain since when turning her head, rates pain moderate. Pt reports back and hip pain is still present and not improving, also rates moderate. She reports she has been moving less since falling/not completing HEP as prescribed.   PERTINENT HISTORY Pt is a pleasant 78 yo female who was  diagnosed with PD in 2019. She is known to clinic and dino, and was last seen for LSVT BIG Sept-Oct 2022. Pt with chronic pain and has become increasingly sedentary as a result exacerbating mobility difficulties due to PD. Current concerns: Pt reports L and R hip pain and back pain. Pt was being seen by pain management clinic. Pt has had injections that provided relief at the time. Pt's spouse reports her physician determined surgery was not an option for her pain so the plan is to manage it. Pt spouse thinks pain has resulted in reluctance for pt to move. Pt currently sedentary, doesn't walk much, but when she does she holds on to furniture for support due to balance problems. When pt first stands she must  hold on to something due to mix of pain and/or dizziness (described as lightheadedness). Her pain can go from 3/10 to 9/10. It does improve once she starts moving. Pt has increased difficulty with multiple activities including: getting in and out of her bed, getting in and out of her car, freezing, decreased gait speed, decreased balance and cognition, and difficulty using BUE due to tremors.  Per chart other PMH includes hx of breast cancer, HTN, MCI, insomnia, cataracts, bipolar 1, alcohol use disorder, GAD, MDD, osteopenia of neck of R femur, chronic B low back pain with radiating pain to buttock and posterior thighs and calves. Pt with multiple comorbidities, please refer to chart for full details    PAIN: from eval Are you having pain? Yes 7/10 Left hip   PRECAUTIONS: Fall  WEIGHT BEARING RESTRICTIONS: No  FALLS: Has patient fallen in last 6 months? Yes. Number of falls 1x, leg tangled in chair leg when getting up from a chair  PLOF: Independent with basic ADLs  PATIENT GOALS: improve mobility  OBJECTIVE   TODAY'S TREATMENT:                                                                                                                                         DATE: 11/16/23   Gait belt donned throughout for pt safety  TA: Goal reassessment completed on this date. Please refer to goal section and assessment below for full details.  - 318 ft stopped at 3 min 43 sec due to reports of back pain and dizziness/lightheadedness and breathlessness. Seated rest break provided and SpO2% taken, found to be 98%   : 0.58 m/s with 4WW  5xSTS: 25.7 sec hands-free, some lightheadedness, SpO2 94%   Reviewed importance of completing HEP and increasing activity levels at home to prevent decline and maintain or make gains.   TE: Pt attempts additional ambulation for strength/endurance 1x148 ft, pt requests to stop after first lap, reports limited due to back pain, rest break  provided/taken and intervention discontinued  ---Modified endurance activity to seated on Nustep  - LE only for improved comfort in low back and to avoid  increase in UE/neck pain - lvl 1 x 4 min. Rates medium. PT monitors pt throughout for response to interventions. Pt maintains SPM 30s-40s. PT provides close CGA for mount/dismount and provides set-up   No increased cervical pain with interventions  PATIENT EDUCATION:  Education details: Pt educated throughout session about proper posture and technique with exercises. Improved exercise technique, movement at target joints, use of target muscles after min to mod verbal, visual, tactile cues. Goals, plan.  Person educated: Patient Education method: Explanation, Demonstration, Tactile cues, and Verbal cues Education comprehension: verbalized understanding, returned demonstration, verbal cues required, tactile cues required, and needs further education    HOME EXERCISE PROGRAM:   8//20/24: Access Code: [email or web address] URL: [email or web address] Date: 06/22/2023 Prepared by: Darryle Patten  Exercises - Sit to Stand with Counter Support  - 1 x daily - 4-6 x weekly - 2 sets - 10 reps - Seated Long Arc Quad  - 1 x daily - 4-6 x weekly - 2 sets - 12 reps - Seated Heel Raise  - 1 x daily - 4-6 x weekly - 1 sets - 20 reps    GOALS: Goals reviewed with patient? Yes  SHORT TERM GOALS: Target date: 04/27/2023    Patient will be independent in home exercise program to improve strength/mobility for better functional independence with ADLs. Baseline: LSVT BIG protocol to be initiated next session; 12/18: not yet fully indep, reports she still gets confused about the technique; 12/03/2022: HEP to be updated; Pt reports practicing HEP 2x/week; 02/23/23: not currently performing HEP, reviewed in session and provided new copy; 03/16/2023: pt reports performing HEP every day; 03/30/23: Pt reports she is not being consistent with her HEP; 7/1: pt  reports she is not doing much at home; 06/10/23: pt unsure how often she is exercising at home; 10/8: pt repots unsure about her HEP Goal status: MET (previously MET)   LONG TERM GOALS: Target date: 06/08/2023     Patient will increase FOTO score to equal to or greater than 49 to demonstrate statistically significant improvement in mobility and quality of life.  Baseline: 40; 12/18: deferred due to time; 45; 3/19: deferred to next visit; 01/21/2023; 02/23/2023: 46 unchanged from previous assessment); 03/17/2023: 48; 06/10/23: 52 Goal status: MET  2.  Patient (> 37 years old) will complete five times sit to stand test in < 15 seconds indicating decreased fall risk.  Baseline: 27 sec; 12/18 23 sec hands-free; 11/04/22: 24 sec hands free; 2/1/124: 22.7 sec use of BUE, 21.3 sec hands-free; 01/19/23: 18 seconds hands-free; 02/23/23: 19 seconds hands-free; 03/16/2023: 18 seconds hands-free; 03/30/23: 17.3 seconds hands-free; 05/03/23: 17.3 seconds hands-free; 06/10/23: 19 seconds hands-free; 10/8: 16 seconds hands-free; 09/21/23: 17 sec hands-free; 10/12/23: 20 sec hands-free; 11/16/23: 25.7 sec hands-free, some lightheadedness, SpO2 94%  Goal status: PARTIALLY MET  3.  Patient will increase 10 meter walk test to >1.8m/s with WFL B step-length, upright posture for >90% of test to improve ease with gait and to override hypokentic and bradykinetic movement.  Baseline: 0.58 m/s with 5TT; 12/18: 0.7 m/s with 5TT; 11/04/22: 0.69 m/s with 5TT;  12/03/22: 0.73 m/s with 5TT; 01/19/2023: 0.77 m/s with 5TT; 02/23/2023: 0.83 m/s with 5TT; 03/16/2023: 0.83 m/s with 5TT , pt still somewhat crouched, requires cues for upright posture >50% of time; 03/30/23: 0.83 m/s with crouched posture; 05/03/23 0.8 m/s with RW, some improvement with upright posture; 06/10/23:  0.77 m/s with 5TT and crouched posture; 10/8: 0.74 m/s 4WW; 09/21/23: 0.71 m/s with 5TT; 12/10:  0.57 m/s with 5TT; 11/16/2023: 0.58 m/s with 4WW Goal status: IN PROGRESS  4.  Patient  will reduce time to don jacket by at least 5 seconds in order to increase ease with dressing and override bradykinetic movement Baseline: 17 seconds; 12/18: 12 seconds Goal status: MET  5.  Pt will demo or report ability to open standard jar using only 1-2 attempts in order to exhibit improved functional mobility for ADLs.  Baseline: 5.7 seconds with multiple attempts to turn jar lid to open; 12/18: 2.8 sec opens in 2 turns Goal status: MET  6.  Patient will increase Berg Balance score by > 6 points to demonstrate decreased fall risk during functional activities. Baseline: to be tested next 1-2 visits; 12/03/2022: 44/56; 01/21/23 45/56; 48/56; 03/17/2023: 48/56; 05/03/23: 49/43; 8/8: 48/56; 08/10/2023: deferred; 11/19: 51/56  Goal status: GOAL MET  7.  Patient will report at least 4 days in a row with minimal to no L hip pain in order to improve QOL and ease/safety with functional mobility. Baseline: Pt reports pain majority of days in L hip; 12/03/2022: pt reports having hip pain about every day; 3/19: rates hip pain daily (being seen now by MD, new imaging); 02/23/2023: pt went for period of time without hip pain (she thinks a week), but reports it has now come back,; 5/28: pt reports hip pain every other day; 05/03/23: pt reports pain in hip every day in past week Goal status: NOT MET/discontinued   8. Patient will increase six minute walk test distance to >1000 for progression to community ambulator and improve gait ability Baseline:02/23/2023: 722 ft with 4WW and two standing rest breaks, rates test as hard; 03/16/2023: deferred to next visit due to time; 5/28/20204: 641 ft with 4WW, pt requests to end test early due to fatigue at 5 min 35 sec; 05/03/23: 645 ft with 4WW and able to complete full test; 06/10/23: 708 ft; 08/10/23: 632 ft with 3 rest breaks; 11/19: 592 ft with 4WW, pt requests to end test early at about 5 min due to fatigue; 10/21/23: Pt completes full 6 minutes with 4 standing rest breaks,  ambulates total of 535 ft with 5TT; 11/16/23: unable to complete full test stops at 3 min 43 sec due to back pain and dizziness/lightheadedness, breathlessness  SpO2% 98% taken following test, completes 318 ft  Goal status: IN PROGRESS   ASSESSMENT:  Goal reassessment completed for second to last PT visit. Pt with decrease in performance on and 5xSTS, where she continues to be limited by LBP, feeling out of breath, and reported dizziness/lightheadedness (physician aware). Test performance indicates decreased gait/functional capacity and decreased BLE strength with increased fall risk. SpO2 assessed and found to be WNL. Pt with similar performance on , indicating she has maintained gait speed. Pt has continued to report to PT minimal activity levels at home and not performing HEP as prescribed. This is likely being impacted by pt's cognition. PT has provided education today and previously on importance of performing HEP and increasing activity levels outside of PT to make gains and/or maintain current functional level. This education was reinforced today to pt, but has also been provided in the past to caregiver/spouse as well. Due to general decline in functional mobility, limitations due to chronic LBP and fatigue, and lack of carryover at home, pt is more appropriate for [facility] PT. PT to reach out to pt's spouse/caregiver to discuss recommendations.  OBJECTIVE IMPAIRMENTS: Abnormal gait, decreased balance, decreased cognition, decreased coordination, decreased endurance, decreased  knowledge of use of DME, decreased mobility, difficulty walking, decreased strength, dizziness, hypomobility, impaired UE functional use, improper body mechanics, postural dysfunction, and pain.   ACTIVITY LIMITATIONS: lifting, bending, standing, squatting, stairs, transfers, bed mobility, bathing, dressing, hygiene/grooming, and locomotion level  PARTICIPATION LIMITATIONS: meal prep, cleaning, laundry, medication  management, personal finances, driving, shopping, community activity, and yard work  PERSONAL FACTORS: Age, Fitness, Sex, Time since onset of injury/illness/exacerbation, and 3+ comorbidities: PMH includes hx of breast cancer, HTN, MCI, insomnia, cataracts, bipolar 1, alcohol use disorder, GAD, MDD, osteopenia of neck of R femur, chronic B low back pain with radiating pain to buttock and posterior thighs and calves. Pt with multiple comorbidities, please refer to chart for full details  are also affecting patient's functional outcome.   REHAB POTENTIAL: Fair    CLINICAL DECISION MAKING: Evolving/moderate complexity  EVALUATION COMPLEXITY: High  PLAN:  PT FREQUENCY: 1-2x/week  PT DURATION: 6 weeks     PLANNED INTERVENTIONS: Therapeutic exercises, Therapeutic activity, Neuromuscular re-education, Balance training, Gait training, Patient/Family education, Self Care, Joint mobilization, Stair training, Vestibular training, Canalith repositioning, Visual/preceptual remediation/compensation, Orthotic/Fit training, DME instructions, Wheelchair mobility training, Spinal mobilization, Cryotherapy, Moist heat, Splintting, Taping, and Manual therapy  PLAN FOR NEXT SESSION: review HEP/activity to complete at home to best maintain gains   Darryle Patten PT, DPT  Physical Therapist - [facility]  11/16/2023, 5:12 PM

## 2023-11-17 NOTE — Telephone Encounter (Signed)
 Pt.notified

## 2023-11-18 ENCOUNTER — Telehealth: Payer: Self-pay

## 2023-11-18 ENCOUNTER — Ambulatory Visit: Payer: Self-pay | Admitting: *Deleted

## 2023-11-18 DIAGNOSIS — M47817 Spondylosis without myelopathy or radiculopathy, lumbosacral region: Secondary | ICD-10-CM | POA: Insufficient documentation

## 2023-11-18 DIAGNOSIS — M5459 Other low back pain: Secondary | ICD-10-CM | POA: Insufficient documentation

## 2023-11-18 NOTE — Progress Notes (Signed)
PROVIDER NOTE: Information contained herein reflects review and annotations entered in association with encounter. Interpretation of such information and data should be left to medically-trained personnel. Information provided to patient can be located elsewhere in the medical record under "Patient Instructions". Document created using STT-dictation technology, any transcriptional errors that may result from process are unintentional.    Patient: Kendra [name]  Service Category: E/M  Provider: Oswaldo Done, MD  DOB: 1946-03-24  DOS: 11/22/2023  Referring Provider: Erasmo Downer, MD  MRN: [id number]  Specialty: Interventional Pain Management  PCP: Erasmo Downer, MD  Type: Established Patient  Setting: Ambulatory outpatient    Location: Office  Delivery: Face-to-face     HPI  Kendra [name], a 78 y.o. year old female, is here today because of her Chronic bilateral low back pain without sciatica [M54.50, G89.29]. Kendra [name] primary complain today is Back Pain (Bilateral ) and Hip Pain (Bilateral )  Pertinent problems: Kendra [name] has Malignant neoplasm of upper-outer quadrant of right breast in female, estrogen receptor positive (HCC); Malignant neoplasm of upper-inner quadrant of right breast in female, estrogen receptor positive (HCC); Mucinous cystadenoma; Parkinson disease (HCC); Osteopenia of neck of femur (Right); Chronic low back pain (1ry area of Pain) (Bilateral) (L>R) w/o sciatica; Chronic pain syndrome; Chronic hip pain (3ry area of Pain) (Bilateral) (L>R); Chronic lower extremity pain (2ry area of Pain) (Bilateral) (L>R); Abnormal MRI, lumbar spine (01/31/2020); Grade 1 Anterolisthesis of lumbar spine (L3/L4) & (5 mm) (L4/L5); Chronic low back pain of over 3 months duration; Osteoarthritis of  AC (acromioclavicular) joint (Left); Osteoarthritis of hip (Right); Impaction fracture of distal end of radius and ulna, sequela (Left); Osteoarthritis of 1st (thumb)  carpometacarpal (CMC) joint of hand (Left); Lumbosacral facet arthropathy (Multilevel) (Bilateral) (T12-S1); Lumbar central spinal stenosis w/o neurogenic claudication (L2-3, L3-4, L4-5); Lumbar foraminal stenosis (Bilateral: L2-3, L4-5) (Right: (Severe) L4-5); Lumbar lateral recess stenosis (Bilateral: L2-3, L3-4, and L4-5) (Severe); DDD (degenerative disc disease), lumbar; Lumbosacral facet syndrome (Multilevel) (Bilateral); Osteoarthritis of lumbar spine; Neurogenic bladder; Tardive dyskinesia; Restless leg syndrome; Lumbar facet joint pain; Spondylosis without myelopathy or radiculopathy, lumbosacral region; and History of breast cancer on their pertinent problem list. Pain Assessment: Severity of Chronic pain is reported as a 8 /10. Location: Back Lower, Right, Left/Rdaites from lwoer back into hips bialteral. Onset: More than a month ago. Quality: Constant, Aching, Radiating (pulsating). Timing: Constant. Modifying factor(s): Tylenol, gabapentin, heating pad, and sitting in recliner with legs up. Vitals:  height is 5\' 6"  (1.676 m) and weight is 231 lb (104.8 kg). Her temporal temperature is 98 F (36.7 C). Her blood pressure is 141/70 (abnormal) and her pulse is 72. Her respiration is 18 and oxygen saturation is 98%.  BMI: Estimated body mass index is 37.28 kg/m as calculated from the following:   Height as of this encounter: 5\' 6"  (1.676 m).   Weight as of this encounter: 231 lb (104.8 kg). Last encounter: 01/26/2023. Last procedure: 01/12/2023. (Left L2-3 LESI #2) (25/25/50/50)  Reason for encounter: evaluation of worsening, or previously known (established) problem.  X-rays of the lumbar spine done 01/04/2023 demonstrated the patient to have lumbar scoliosis with diffuse multilevel degenerative changes most prominent at the L to 3 and L5-S1 levels with a 5 mm anterolisthesis of L4 over L5.  The patient also has evidence of diffuse severe facet hypertrophy.  At the time the patient also had some  sclerotic densities noted at the L2 and L4 vertebral body levels.  She does have  a history of breast cancer.  Discussed the use of AI scribe software for clinical note transcription with the patient, who gave verbal consent to proceed.  History of Present Illness   The patient, with a history of breast cancer and lower back pain, presents with worsening lower back pain. The pain is localized to the lower back, with no radiation to the legs. The patient has previously undergone an epidural steroid injection at the L23 level, which initially provided only 25% relief, but later increased to 50% relief. The patient also has a history of facet joint problems in the lower back.  Recently, the patient experienced a fall resulting in a minor head injury. An MRI has been ordered for the head, neck, and shoulder. The patient also reports difficulty sleeping in bed, preferring to sleep in a recliner with a heating pad. The patient has limited mobility, with standing for more than a few minutes causing discomfort, and walking is primarily limited to physical therapy sessions. The patient's pain is currently across the lower back, predominantly on the right side but also affecting the left.       Pharmacotherapy Assessment  Analgesic: No chronic opioid analgesics therapy prescribed by our practice. Tramadol 50 mg tablet, 1 tab p.o. twice daily MME/day: 10 mg/day   Monitoring: [place] PMP: PDMP reviewed during this encounter.       Pharmacotherapy: No side-effects or adverse reactions reported. Compliance: No problems identified. Effectiveness: Clinically acceptable.  Earlyne Iba, RN  11/22/2023 10:31 AM  Sign when Signing Visit Safety precautions to be maintained throughout the outpatient stay will include: orient to surroundings, keep bed in low position, maintain call bell within reach at all times, provide assistance with transfer out of bed and ambulation.     No results found for: "CBDTHCR" No results  found for: "D8THCCBX" No results found for: "D9THCCBX"  UDS:  Summary  Date Value Ref Range Status  12/22/2021 Note  Final    Comment:    ==================================================================== Compliance Drug Analysis, Ur ==================================================================== Test                             Result       Flag       Units  Drug Present and Declared for Prescription Verification   Tramadol                       >5155        EXPECTED   ng/mg creat   O-Desmethyltramadol            1528         EXPECTED   ng/mg creat   N-Desmethyltramadol            4768         EXPECTED   ng/mg creat    Source of tramadol is a prescription medication. O-desmethyltramadol    and N-desmethyltramadol are expected metabolites of tramadol.    Gabapentin                     PRESENT      EXPECTED   Fluoxetine                     PRESENT      EXPECTED   Norfluoxetine                  PRESENT  EXPECTED    Norfluoxetine is an expected metabolite of fluoxetine.    Trazodone                      PRESENT      EXPECTED   1,3 chlorophenyl piperazine    PRESENT      EXPECTED    1,3-chlorophenyl piperazine is an expected metabolite of trazodone.    Risperidone                    PRESENT      EXPECTED  Drug Absent but Declared for Prescription Verification   Acetaminophen                  Not Detected UNEXPECTED    Acetaminophen, as indicated in the declared medication list, is not    always detected even when used as directed.    Diclofenac                     Not Detected UNEXPECTED    Diclofenac, as indicated in the declared medication list, is not    always detected even when used as directed.  ==================================================================== Test                      Result    Flag   Units      Ref Range   Creatinine              97               mg/dL      >=86 ==================================================================== Declared  Medications:  The flagging and interpretation on this report are based on the  following declared medications.  Unexpected results may arise from  inaccuracies in the declared medications.   **Note: The testing scope of this panel includes these medications:   Fluoxetine  Gabapentin (Neurontin)  Risperidone (Risperdal)  Tramadol (Ultram)  Trazodone (Desyrel)   **Note: The testing scope of this panel does not include small to  moderate amounts of these reported medications:   Acetaminophen  Diclofenac (Voltaren)   **Note: The testing scope of this panel does not include the  following reported medications:   Carbidopa (Sinemet)  Denosumab (Prolia)  Hydrochlorothiazide (Zestoretic)  Letrozole (Femara)  Levodopa (Sinemet)  Lisinopril (Zestoretic)  Meloxicam (Mobic)  Nystatin  Polyethylene Glycol  Vitamin B12 ==================================================================== For clinical consultation, please call [phone]. ====================================================================       ROS  Constitutional: Denies any fever or chills Gastrointestinal: No reported hemesis, hematochezia, vomiting, or acute GI distress Musculoskeletal: Denies any acute onset joint swelling, redness, loss of ROM, or weakness Neurological: No reported episodes of acute onset apraxia, aphasia, dysarthria, agnosia, amnesia, paralysis, loss of coordination, or loss of consciousness  Medication Review  AMBULATORY NON FORMULARY MEDICATION, Acetaminophen, FLUoxetine HCl, cyanocobalamin, denosumab, gabapentin, losartan-hydrochlorothiazide, nystatin, risperiDONE, and traZODone  History Review  Allergy: Kendra [name] is allergic to hydroxyzine, other, and shrimp [shellfish allergy]. Drug: Kendra [name]  reports no history of drug use. Alcohol:  reports that she does not currently use alcohol. Tobacco:  reports that she has never smoked. She has never used smokeless tobacco. Social:  Kendra [name]  reports that she has never smoked. She has never used smokeless tobacco. She reports that she does not currently use alcohol. She reports that she does not use drugs. Medical:  has a past medical history of Anxiety, Breast cancer (HCC) (03/2018), Bursitis, Complication of anesthesia, Depression,  Family history of breast cancer (03/24/2018), Family history of lung cancer (03/24/2018), History of alcohol dependence (HCC) (2007), History of hiatal hernia, History of psychosis (2014), Hypertension, Parkinson disease (HCC), Personal history of radiation therapy (06/2018-07/2018), and PONV (postoperative nausea and vomiting). Surgical: Ms. Kotlarz  has a past surgical history that includes Ovarian cyst removal; Tonsillectomy and adenoidectomy (1953); Tonsillectomy; laparotomy (N/A, 02/10/2018); Salpingoophorectomy (Bilateral, 02/10/2018); Mass excision (01/2018); Breast lumpectomy with radioactive seed and sentinel lymph node biopsy (Right, 04/27/2018); Cataract extraction w/PHACO (Left, 08/30/2019); Cataract extraction w/PHACO (Right, 09/20/2019); Breast biopsy (Right, 03/14/2018); Breast biopsy (Right, 03/14/2018); and Breast lumpectomy (Right, 04/27/2018). Family: family history includes Breast cancer (age of onset: 53) in her cousin; Breast cancer (age of onset: 68) in her mother; Diabetes in her mother; Heart disease in her paternal grandfather; Heart disease (age of onset: 97) in her brother; Heart disease (age of onset: 75) in her paternal grandmother; Hypertension in her mother; Lung cancer in her father.  Laboratory Chemistry Profile   Renal Lab Results  Component Value Date   BUN 6 (L) 11/05/2023   CREATININE 0.72 11/05/2023   BCR 8 (L) 11/05/2023   GFRAA >60 04/29/2020   GFRNONAA >60 08/31/2023    Hepatic Lab Results  Component Value Date   AST 15 11/05/2023   ALT 22 11/05/2023   ALBUMIN 4.4 11/05/2023   ALKPHOS 66 11/05/2023   HCVAB <0.1 06/01/2019    Electrolytes Lab Results   Component Value Date   NA 134 11/05/2023   K 3.9 11/05/2023   CL 97 11/05/2023   CALCIUM 9.2 11/05/2023   MG 2.0 12/22/2021    Bone Lab Results  Component Value Date   VD25OH 15.6 (L) 11/06/2022   TESTOSTERONE 39 06/20/2015    Inflammation (CRP: Acute Phase) (ESR: Chronic Phase) Lab Results  Component Value Date   CRP 2 12/22/2021   ESRSEDRATE 20 12/22/2021         Note: Above Lab results reviewed.  Recent Imaging Review  MM 3D SCREENING MAMMOGRAM BILATERAL BREAST CLINICAL DATA:  Screening.  EXAM: DIGITAL SCREENING BILATERAL MAMMOGRAM WITH TOMOSYNTHESIS AND CAD  TECHNIQUE: Bilateral screening digital craniocaudal and mediolateral oblique mammograms were obtained. Bilateral screening digital breast tomosynthesis was performed. The images were evaluated with computer-aided detection.  COMPARISON:  Previous exam(s).  ACR Breast Density Category b: There are scattered areas of fibroglandular density.  FINDINGS: There are no findings suspicious for malignancy.  IMPRESSION: No mammographic evidence of malignancy. A result letter of this screening mammogram will be mailed directly to the patient.  RECOMMENDATION: Screening mammogram in one year. (Code:SM-B-01Y)  BI-RADS CATEGORY  1: Negative.  Electronically Signed   By: Edwin Cap M.D.   On: 06/22/2023 14:13 Note: Reviewed        Physical Exam  General appearance: Well nourished, well developed, and well hydrated. In no apparent acute distress Mental status: Alert, oriented x 3 (person, place, & time)       Respiratory: No evidence of acute respiratory distress Eyes: PERLA Vitals: BP (!) 141/70 (Cuff Size: Normal)   Pulse 72   Temp 98 F (36.7 C) (Temporal)   Resp 18   Ht 5\' 6"  (1.676 m)   Wt 231 lb (104.8 kg)   SpO2 98%   BMI 37.28 kg/m  BMI: Estimated body mass index is 37.28 kg/m as calculated from the following:   Height as of this encounter: 5\' 6"  (1.676 m).   Weight as of this  encounter: 231 lb (104.8 kg). Ideal: Ideal  body weight: 59.3 kg (130 lb 11.7 oz) Adjusted ideal body weight: 77.5 kg (170 lb 13.4 oz)  Assessment   Diagnosis Status  1. Chronic low back pain (1ry area of Pain) (Bilateral) (L>R) w/o sciatica   2. Chronic low back pain of over 3 months duration   3. Grade 1 Anterolisthesis of lumbar spine (L3/L4) & (5 mm) (L4/L5)   4. Lumbosacral facet arthropathy (Multilevel) (Bilateral) (T12-S1)   5. Lumbar facet joint pain   6. Lumbosacral facet syndrome (Multilevel) (Bilateral)   7. Osteoarthritis of lumbar spine   8. Spondylosis without myelopathy or radiculopathy, lumbosacral region   9. History of breast cancer   10. History of allergy to shellfish    Controlled Controlled Controlled   Updated Problems: Problem  History of Breast Cancer     Plan of Care  Problem-specific:  Assessment and Plan    Chronic Lower Back Pain Chronic lower back pain is primarily localized to the lower back with occasional bilateral involvement, more pronounced on the right. A previous L2-3 epidural steroid injection provided partial relief, indicating a multifactorial etiology. X-rays showed facet joint issues, with differential diagnoses including facet joint arthropathy and possible spinal stenosis. Lumbar facet injections with light sedation are planned, with the option of IV sedation to manage procedural discomfort. An MRI of the lumbar spine is necessary to assess soft tissue and rule out metastasis due to a history of breast cancer.  Breast Cancer Breast cancer requires vigilance for potential metastasis, especially with the new onset of lower back pain. Ensure the MRI of the lumbar spine is performed to rule out metastatic disease.  Recent Fall A recent fall resulted in minor head trauma. An MRI of the head, neck, and shoulder has been ordered to assess for potential injuries, with scheduling coordinated to space out the MRIs over different days.  General  Health Maintenance There is significant discomfort with prolonged standing, and sleeping in a recliner with a heating pad is preferred. Mobility and activity are limited, primarily confined to the house except for appointments and physical therapy. Continued physical therapy is encouraged to maintain mobility and manage pain. Safe use of the heating pad is advised to avoid burns.  Follow-up A follow-up appointment will be scheduled after the MRI and lumbar facet injections to assess pain relief and adjust the treatment plan as necessary.       Kendra [name] has a current medication list which includes the following long-term medication(s): fluoxetine hcl, gabapentin, losartan-hydrochlorothiazide, risperidone, and trazodone.  Pharmacotherapy (Medications Ordered): No orders of the defined types were placed in this encounter.  Orders:  Orders Placed This Encounter  Procedures   LUMBAR FACET(MEDIAL BRANCH NERVE BLOCK) MBNB    Diagnosis: Lumbar Facet Syndrome (M47.816); Lumbosacral Facet Syndrome (M47.817); Lumbar Facet Joint Pain (M54.59) Medical Necessity Statement: 1.Severe chronic axial low back pain causing functional impairment documented by ongoing pain scale assessments. 2.Pain present for longer than 3 months (Chronic) documented to have failed noninvasive conservative therapies. 3.Absence of untreated radiculopathy. 4.There is no radiological evidence of untreated fractures, tumor, infection, or deformity.  Physical Examination Findings: Positive Kemp Maneuver: (Y)  Positive Lumbar Hyperextension-Rotation provocative test: (Y)    Standing Status:   Future    Expiration Date:   02/16/2024    Scheduling Instructions:     Procedure: Lumbar facet Block     Type: Medial Branch Block     Side: Bilateral     Purpose: Diagnostic Radiologic Mapping     Level(s):  L3-4, L4-5, L5-S1, and TBD by Fluoroscopic Mapping Facets (L2, L3, L4, L5, S1, and TBD Medial Branch)     Sedation:  Patient's choice.     Timeframe: ASAA    Where will this procedure be performed?:   [facility]   MR LUMBAR SPINE WO CONTRAST    Patient presents with axial pain with possible radicular component. Please assist [place] in identifying specific level(s) and laterality of any additional findings such as: 1. Facet (Zygapophyseal) joint DJD (Hypertrophy, space narrowing, subchondral sclerosis, and/or osteophyte formation) 2. DDD and/or IVDD (Loss of disc height, desiccation, gas patterns, osteophytes, endplate sclerosis, or "Black disc disease") 3. Pars defects 4. Spondylolisthesis, spondylosis, and/or spondyloarthropathies (include Degree/Grade of displacement in mm) (stability) 5. Vertebral body Fractures (acute/chronic) (state percentage of collapse) 6. Demineralization (osteopenia/osteoporotic) 7. Bone pathology 8. Foraminal narrowing  9. Surgical changes 10. Central, Lateral Recess, and/or Foraminal Stenosis (include AP diameter of stenosis in mm) 11. Surgical changes (hardware type, status, and presence of fibrosis) 12. Modic Type Changes (MRI only) 13. IVDD (Disc bulge, protrusion, herniation, extrusion) (Level, laterality, extent)    Standing Status:   Future    Expiration Date:   02/20/2024    Scheduling Instructions:     Please make sure that the patient understands that this needs to be done as soon as possible. Never have the patient do the imaging "just before the next appointment". Inform patient that having the imaging done within the [facility] will expedite the availability of the results and will provide      imaging availability to the requesting physician. In addition inform the patient that the imaging order has an expiration date and will not be renewed if not done within the active period.    What is the patient's sedation requirement?:   No Sedation    Does the patient have a pacemaker or implanted devices?:   No    Preferred imaging location?:   [facility]  (table limit-350lbs)    Call Results- Best Contact Number?:   [phone] [facility]    Radiology Contrast Protocol - do NOT remove file path:   [email or web address]   Follow-up plan:   Return for (ECT): (B) L-FCT Blk #1.      Interventional Therapies  Risk  Complexity Considerations:   Allergy to shellfish    Planned  Pending:   Diagnostic bilateral lumbar facet MBB #1    Under consideration:   Diagnostic left L2-3 LESI #3  Diagnostic midline L3-4 LESI #1  Diagnostic bilateral lumbar facet MBB #1    Completed:   Diagnostic/diagnostic left L2-3 LESI x2 (01/12/2023) (1st:100/100/75/75) (2nd: 25/25/50/50)    Completed by other providers:   (06/27/2021) bilateral L5-S1 transforaminal ESI (75% relief) by Merri Ray, DO (08/14/2021) left L5-S1 transforaminal ESI + left S1 TFESI by Merri Ray, DO   Therapeutic  Palliative (PRN) options:   None established      Recent Visits No visits were found meeting these conditions. Showing recent visits within past 90 days and meeting all other requirements Today's Visits Date Type Provider Dept  11/22/23 Office Visit Delano Metz, MD [facility]  Showing today's visits and meeting all other requirements Future Appointments No visits were found meeting these conditions. Showing future appointments within next 90 days and meeting all other requirements  I discussed the assessment and treatment plan with the patient. The patient was provided an opportunity to ask questions and all were answered. The  patient agreed with the plan and demonstrated an understanding of the instructions.  Patient advised to call back or seek an in-person evaluation if the symptoms or condition worsens.  Duration of encounter: 30 minutes.  Total time on encounter, as per AMA guidelines included both the face-to-face and non-face-to-face time personally  spent by the physician and/or other qualified health care professional(s) on the day of the encounter (includes time in activities that require the physician or other qualified health care professional and does not include time in activities normally performed by clinical staff). Physician's time may include the following activities when performed: Preparing to see the patient (e.g., pre-charting review of records, searching for previously ordered imaging, lab work, and nerve conduction tests) Review of prior analgesic pharmacotherapies. Reviewing PMP Interpreting ordered tests (e.g., lab work, imaging, nerve conduction tests) Performing post-procedure evaluations, including interpretation of diagnostic procedures Obtaining and/or reviewing separately obtained history Performing a medically appropriate examination and/or evaluation Counseling and educating the patient/family/caregiver Ordering medications, tests, or procedures Referring and communicating with other health care professionals (when not separately reported) Documenting clinical information in the electronic or other health record Independently interpreting results (not separately reported) and communicating results to the patient/ family/caregiver Care coordination (not separately reported)  Note by: Oswaldo Done, MD Date: 11/22/2023; Time: 11:11 AM

## 2023-11-18 NOTE — Patient Instructions (Signed)

## 2023-11-18 NOTE — Telephone Encounter (Signed)
PT reached out to pt's spouse (caregiver) on 11/17/2023 via secure phone and discussed plan for d/c next appointment and recommendations for [facility] PT. Pt spouse verbalized understanding.  Temple Pacini PT, DPT

## 2023-11-19 ENCOUNTER — Other Ambulatory Visit: Payer: Self-pay | Admitting: Student

## 2023-11-19 DIAGNOSIS — G2401 Drug induced subacute dyskinesia: Secondary | ICD-10-CM

## 2023-11-19 DIAGNOSIS — M542 Cervicalgia: Secondary | ICD-10-CM

## 2023-11-19 DIAGNOSIS — R4189 Other symptoms and signs involving cognitive functions and awareness: Secondary | ICD-10-CM

## 2023-11-19 DIAGNOSIS — W19XXXA Unspecified fall, initial encounter: Secondary | ICD-10-CM

## 2023-11-19 NOTE — Patient Instructions (Signed)
Visit Information  Thank you for taking time to visit with me today. Please don't hesitate to contact me if I can be of assistance to you.   Following are the goals we discussed today:   Goals Addressed             This Visit's Progress    Caregiver support resources       Activities and task to complete in order to accomplish goals.   Self Support options  (continue with self care plan previously in place-spending time with friends and maintaining personal interest, CSW to send resources for Dementia support by mail, continue to utilize calendar to organize  dates and time of medical appointments)           If you are experiencing a Mental Health or Behavioral Health Crisis or need someone to talk to, please call 911   Patient verbalizes understanding of instructions and care plan provided today and agrees to view in MyChart. Active MyChart status and patient understanding of how to access instructions and care plan via MyChart confirmed with patient.     No further follow up required: community resources provided  [employer], [employer] [facility]  [facility], [facility] Licensed Clinical Social Worker Care Coordinator  Direct Dial: [phone]

## 2023-11-19 NOTE — Patient Outreach (Signed)
  Care Coordination   Initial Visit Note   11/19/2023 Name: Kendra [name] MRN: [id number] DOB: 06-26-1946  Kendra [name] is a 78 y.o. year old female who sees Bacigalupo, Marzella Schlein, MD for primary care. I spoke with  Feliberto Harts by phone on 11/18/23.  What matters to the patients health and wellness today?  Community resources for caregiver support    Goals Addressed             This Visit's Progress    Caregiver support resources       Activities and task to complete in order to accomplish goals.   Self Support options  (continue with self care plan previously in place-spending time with friends and maintaining personal interest, CSW to send resources for Dementia support by mail, continue to utilize calendar to organize  dates and time of medical appointments)          SDOH assessments and interventions completed:  Yes  SDOH Interventions Today    Flowsheet Row Most Recent Value  SDOH Interventions   Food Insecurity Interventions Intervention Not Indicated  Housing Interventions Intervention Not Indicated  Transportation Interventions Intervention Not Indicated  Utilities Interventions Intervention Not Indicated  Social Connections Interventions Intervention Not Indicated        Care Coordination Interventions:  Yes, provided  Interventions Today    Flowsheet Row Most Recent Value  Chronic Disease   Chronic disease during today's visit Other  [dementia, caregiver stress]  General Interventions   General Interventions Discussed/Reviewed General Interventions Discussed, [employer], Doctor Visits, Level of Care  [Assessment of current community resource needs for dementia/caregiver  support]  Doctor Visits Discussed/Reviewed Doctor Visits Discussed, Specialist  [Neurologist-TBD,[facility] TBD, Pain Management 11/22/23]  Level of Care Personal Care Services, Adult Daycare  [Declines resources for Adult Day Programs-has private duty care 2x per  week 8:30-1:30pm]  Exercise Interventions   Exercise Discussed/Reviewed Exercise Discussed  Production manager with outpatient PT]  Mental Health Interventions   Mental Health Discussed/Reviewed Mental Health Discussed  [caregiver stress acknowledged, self care/boundaries emphasized, support groups options offered-will mail a list of resources.]       Follow up plan: No further intervention required.   Encounter Outcome:  Patient Visit Completed

## 2023-11-22 ENCOUNTER — Encounter: Payer: Self-pay | Admitting: Pain Medicine

## 2023-11-22 ENCOUNTER — Telehealth: Payer: Self-pay | Admitting: Family Medicine

## 2023-11-22 ENCOUNTER — Telehealth: Payer: Self-pay | Admitting: Student

## 2023-11-22 ENCOUNTER — Ambulatory Visit: Payer: Medicare PPO | Attending: Pain Medicine | Admitting: Pain Medicine

## 2023-11-22 VITALS — BP 141/70 | HR 72 | Temp 98.0°F | Resp 18 | Ht 66.0 in | Wt 231.0 lb

## 2023-11-22 DIAGNOSIS — Z91013 Allergy to seafood: Secondary | ICD-10-CM

## 2023-11-22 DIAGNOSIS — N3000 Acute cystitis without hematuria: Secondary | ICD-10-CM

## 2023-11-22 DIAGNOSIS — M5459 Other low back pain: Secondary | ICD-10-CM

## 2023-11-22 DIAGNOSIS — M47817 Spondylosis without myelopathy or radiculopathy, lumbosacral region: Secondary | ICD-10-CM | POA: Diagnosis not present

## 2023-11-22 DIAGNOSIS — M47816 Spondylosis without myelopathy or radiculopathy, lumbar region: Secondary | ICD-10-CM | POA: Diagnosis not present

## 2023-11-22 DIAGNOSIS — M545 Low back pain, unspecified: Secondary | ICD-10-CM

## 2023-11-22 DIAGNOSIS — R300 Dysuria: Secondary | ICD-10-CM

## 2023-11-22 DIAGNOSIS — Z853 Personal history of malignant neoplasm of breast: Secondary | ICD-10-CM

## 2023-11-22 DIAGNOSIS — G8929 Other chronic pain: Secondary | ICD-10-CM

## 2023-11-22 DIAGNOSIS — M4316 Spondylolisthesis, lumbar region: Secondary | ICD-10-CM

## 2023-11-22 NOTE — Progress Notes (Signed)
Safety precautions to be maintained throughout the outpatient stay will include: orient to surroundings, keep bed in low position, maintain call bell within reach at all times, provide assistance with transfer out of bed and ambulation.  

## 2023-11-22 NOTE — Telephone Encounter (Signed)
Kendra [name] w/ Dr Sherryll Burger office at [place] called to say they did a UA on the pt this am and it looks lke she has a UTI.  They would like her pcp to treat this, as they do not. Kendra [name] is going to fax results for the dr to look at.

## 2023-11-22 NOTE — Telephone Encounter (Signed)
Front desk has been trying to contact them to set up an appt with a therapist.

## 2023-11-22 NOTE — Telephone Encounter (Signed)
PT's husband calling (DPR) asking if they should wait for her in lab sleep study after she gets a nerve block that relives the pain in her spine since she will be much more comfortable then. Pls call [phone]  Injection is not yet sched. He said. He will wait for her advise.

## 2023-11-23 ENCOUNTER — Ambulatory Visit: Payer: Medicare PPO

## 2023-11-23 ENCOUNTER — Other Ambulatory Visit: Payer: Self-pay | Admitting: Neurology

## 2023-11-23 ENCOUNTER — Ambulatory Visit: Payer: Medicare PPO | Admitting: Speech Pathology

## 2023-11-23 ENCOUNTER — Other Ambulatory Visit: Payer: Self-pay | Admitting: Pain Medicine

## 2023-11-23 DIAGNOSIS — R2681 Unsteadiness on feet: Secondary | ICD-10-CM

## 2023-11-23 DIAGNOSIS — M5459 Other low back pain: Secondary | ICD-10-CM

## 2023-11-23 DIAGNOSIS — M542 Cervicalgia: Secondary | ICD-10-CM | POA: Diagnosis not present

## 2023-11-23 DIAGNOSIS — M6281 Muscle weakness (generalized): Secondary | ICD-10-CM

## 2023-11-23 DIAGNOSIS — M25552 Pain in left hip: Secondary | ICD-10-CM | POA: Diagnosis not present

## 2023-11-23 DIAGNOSIS — R41841 Cognitive communication deficit: Secondary | ICD-10-CM

## 2023-11-23 DIAGNOSIS — R262 Difficulty in walking, not elsewhere classified: Secondary | ICD-10-CM | POA: Diagnosis not present

## 2023-11-23 NOTE — Therapy (Signed)
OUTPATIENT SPEECH LANGUAGE PATHOLOGY  COGNITION EVALUATION EVALUATION ONLY   Patient Name: Kendra [name] MRN: [id number] DOB:03-09-1946, 78 y.o., female Today's Date: 11/23/2023  PCP: Shirlee Latch, MD REFERRING PROVIDER: Janice Coffin, PA   End of Session - 11/23/23 1201     Visit Number 1    SLP Start Time 1100    SLP Stop Time  1145    SLP Time Calculation (min) 45 min    Activity Tolerance Patient tolerated treatment well             Past Medical History:  Diagnosis Date   Anxiety    Breast cancer (HCC) 03/2018   right breast cancer at 11:00 and 1:00   Bursitis    bilateral hips and knees   Complication of anesthesia    Depression    Family history of breast cancer 03/24/2018   Family history of lung cancer 03/24/2018   History of alcohol dependence (HCC) 2007   no ETOH; resolved since 2007   History of hiatal hernia    History of psychosis 2014   due to sleep disturbance   Hypertension    Parkinson disease (HCC)    Personal history of radiation therapy 06/2018-07/2018   right breast ca   PONV (postoperative nausea and vomiting)    Past Surgical History:  Procedure Laterality Date   BREAST BIOPSY Right 03/14/2018   11:00 DCIS and invasive ductal carcinoma   BREAST BIOPSY Right 03/14/2018   1:00 Invasive ductal carcinoma   BREAST LUMPECTOMY Right 04/27/2018   lumpectomy of 11 and 1:00 cancers, clear margins, negative LN   BREAST LUMPECTOMY WITH RADIOACTIVE SEED AND SENTINEL LYMPH NODE BIOPSY Right 04/27/2018   Procedure: RIGHT BREAST LUMPECTOMY WITH RADIOACTIVE SEED X 2 AND RIGHT SENTINEL LYMPH NODE BIOPSY;  Surgeon: Glenna Fellows, MD;  Location: [facility];  Service: General;  Laterality: Right;   CATARACT EXTRACTION W/PHACO Left 08/30/2019   Procedure: CATARACT EXTRACTION PHACO AND INTRAOCULAR LENS PLACEMENT (IOC) LEFT panoptix toric  01:20.5  12.7%  10.26;  Surgeon: Lockie Mola, MD;  Location: [facility];  Service:  Ophthalmology;  Laterality: Left;   CATARACT EXTRACTION W/PHACO Right 09/20/2019   Procedure: CATARACT EXTRACTION PHACO AND INTRAOCULAR LENS PLACEMENT (IOC) RIGHT 5.71  00:36.4  15.7%;  Surgeon: Lockie Mola, MD;  Location: [facility];  Service: Ophthalmology;  Laterality: Right;   LAPAROTOMY N/A 02/10/2018   Procedure: EXPLORATORY LAPAROTOMY;  Surgeon: Shonna Chock, MD;  Location: [facility];  Service: Gynecology;  Laterality: N/A;   MASS EXCISION  01/2018   abdominal   OVARIAN CYST REMOVAL     SALPINGOOPHORECTOMY Bilateral 02/10/2018   Procedure: BILATERAL SALPINGO OOPHORECTOMY; PERITONEAL WASHINGS;  Surgeon: Shonna Chock, MD;  Location: [facility];  Service: Gynecology;  Laterality: Bilateral;   TONSILLECTOMY     TONSILLECTOMY AND ADENOIDECTOMY  1953   Patient Active Problem List   Diagnosis Date Noted   History of breast cancer 11/22/2023   Lumbar facet joint pain 11/18/2023   Spondylosis without myelopathy or radiculopathy, lumbosacral region 11/18/2023   Excessive daytime sleepiness 10/16/2023   Restless leg syndrome 10/16/2023   Memory impairment 10/16/2023   Tardive dyskinesia 09/09/2023   Snoring 09/09/2023   Neurogenic bladder 05/14/2023   Prediabetes 12/15/2022   Chronic cough 08/17/2022   History of allergy to shellfish 02/24/2022   Chronic low back pain of over 3 months duration 02/04/2022   Osteoarthritis of  AC (acromioclavicular) joint (Left) 02/04/2022   Osteoarthritis of hip (Right) 02/04/2022  Impaction fracture of distal end of radius and ulna, sequela (Left) 02/04/2022   Osteoarthritis of 1st (thumb) carpometacarpal (CMC) joint of hand (Left) 02/04/2022   Lumbosacral facet arthropathy (Multilevel) (Bilateral) (T12-S1) 02/04/2022   Lumbar central spinal stenosis w/o neurogenic claudication (L2-3, L3-4, L4-5) 02/04/2022   Lumbar foraminal stenosis (Bilateral: L2-3, L4-5) (Right: (Severe) L4-5) 02/04/2022   Lumbar lateral recess stenosis  (Bilateral: L2-3, L3-4, and L4-5) (Severe) 02/04/2022   DDD (degenerative disc disease), lumbar 02/04/2022   Lumbosacral facet syndrome (Multilevel) (Bilateral) 02/04/2022   Osteoarthritis of lumbar spine 02/04/2022   Allergic conjunctivitis of both eyes 12/23/2021   Chronic hip pain (3ry area of Pain) (Bilateral) (L>R) 12/22/2021   Chronic lower extremity pain (2ry area of Pain) (Bilateral) (L>R) 12/22/2021   Abnormal MRI, lumbar spine (01/31/2020) 12/22/2021   Grade 1 Anterolisthesis of lumbar spine (L3/L4) & (5 mm) (L4/L5) 12/22/2021   Chronic pain syndrome 11/17/2021   High risk medication use 11/17/2021   Disorder of skeletal system 11/17/2021   Problems influencing health status 11/17/2021   Other hyperlipidemia 10/21/2021   Polypharmacy 08/12/2021   Chronic low back pain (1ry area of Pain) (Bilateral) (L>R) w/o sciatica 06/18/2021   Osteopenia of neck of femur (Right) 09/06/2020   Obesity 08/29/2020   Parkinson disease (HCC) 02/22/2020   Mucinous cystadenoma 12/24/2019   Bipolar disorder, in full remission, most recent episode depressed (HCC) 11/08/2019   Vitamin D insufficiency 08/24/2019   Insomnia 05/31/2019   Bipolar I disorder, most recent episode depressed (HCC) 05/31/2019   Alcohol use disorder, moderate, in sustained remission (HCC) 05/31/2019   Elevated tumor markers 05/27/2019   GAD (generalized anxiety disorder) 04/10/2019   MDD (major depressive disorder), severe (HCC) 01/31/2019   High serum carbohydrate antigen 19-9 (CA19-9) 12/01/2018   Adjustment disorder with anxious mood 11/24/2018   Essential hypertension 11/24/2018   B12 deficiency 10/17/2018   Incisional hernia, without obstruction or gangrene 09/12/2018   Genetic testing 04/07/2018   Family history of breast cancer 03/24/2018   Family history of lung cancer 03/24/2018   Malignant neoplasm of upper-outer quadrant of right breast in female, estrogen receptor positive (HCC) 03/17/2018   Malignant  neoplasm of upper-inner quadrant of right breast in female, estrogen receptor positive (HCC) 03/17/2018   History of psychosis 02/07/2018   History of insomnia 02/07/2018    ONSET DATE: hx of memory loss dating back to 2005; date of referral 11/15/2023  REFERRING DIAG: R41.89 (ICD-10-CM) - Cognitive impairment  THERAPY DIAG:  Cognitive communication deficit  Rationale for Evaluation and Treatment Rehabilitation  SUBJECTIVE:   SUBJECTIVE STATEMENT: Pt with moderate flat affect, apologetic about cognitive deficits voiced "I am sorry" following completion of standard assessment while looking at her husband and personal assistance Pt accompanied by: significant other and personal caregiver  PERTINENT HISTORY: Patient is a 78 y.o. female with history of Parkinson's disease, anxiety, depression, psychosis, HTN. bipolar disorder, alcoholism, memory issues since 2005, hx head trauma.   PAIN:  Are you having pain? No   FALLS: Has patient fallen in last 6 months?  See PT evaluation for details  LIVING ENVIRONMENT: Lives with: lives with their spouse Lives in: House/apartment  PLOF:  Level of assistance: Needed assistance with ADLs, Needed assistance with IADLS Employment: Retired   PATIENT GOALS   to increase cognitive communication independence  OBJECTIVE:   COGNITIVE COMMUNICATION Overall cognitive status: Impaired Areas of impairment:  Oriented to person Attention: Impaired: Selective Memory: Impaired: Immediate Working Short term Sport and exercise psychologist function: Impaired: Comment:  all impaired by lower level deficits Functional Impairments: pt with recent difficulty sequencing tasks such as personal hygiene and dressing, has personal care attendant several days of the week who assists, cooks meals and provides light housekeeping  AUDITORY COMPREHENSION  Overall auditory comprehension: Appears intact YES/NO questions: Appears intact Following directions:  Appears intact Conversation: Simple Interfering components: attention and working memory Effective technique: repetition/stressing words  READING COMPREHENSION: not tested  EXPRESSION: verbal  VERBAL EXPRESSION:   Overall verbal expression: Appears intact Level of generative/spontaneous verbalization: sentence Automatic speech: name: intact and social response: intact  Repetition: Appears intact Naming: Divergent: 0-25% Pragmatics: Impaired: abnormal effect, dysprosody, and eye contact Comments: pt appeared to have flat affect Interfering components:  cognitive deficits Non-verbal means of communication: N/A  WRITTEN EXPRESSION: Dominant hand: right Written expression: Appears intact  ORAL MOTOR EXAMINATION Facial : WFL Lingual: WFL Velum: WFL Mandible: WFL Cough: WFL Voice: WFL  MOTOR SPEECH: Overall motor speech: Appears intact Respiration: diaphragmatic/abdominal breathing Phonation: normal Resonance: WFL Articulation: Appears intact Intelligibility: Intelligible - stable, > 95% speech intelligibility at the unknown information and to an unfamiliar listener Motor planning: Appears intact   STANDARDIZED ASSESSMENTS: Addenbrooke's Cognitive Examination - ACE III The Addenbrooke's Cognitive Examination-III (ACE-III) is a brief cognitive test that assesses five cognitive domains. The total score is 100 with higher scores indicating better cognitive functioning. Cut off scores of 88 and 82 are recommended for suspicion of dementia (88 has sensitivity of 1.00 and specificity of 0.96, 82 has sensitivity of 0.93 and specificity of 1.00). American Version A  Attention 9/18  Memory 11/26  Fluency 3/14  Language 21/26  Visuospatial 12/16  TOTAL ACE- III Score 12/100       PATIENT EDUCATION: Education details: results of this assessment Person educated: Patient, Spouse, and Caregiver personal aide Education method: Explanation Education comprehension: verbalized  understanding    ASSESSMENT:  CLINICAL IMPRESSION: Patient is a 78 y.o. right handed who was seen today for a cognitive communication evaluation in the setting of potential for neurodegenerative disease process. Marland KitchenPt presents with moderate amnestic cognitive impairment impacting all aspects of cognitive function. Given the report of difficulty with completing of sequencing would recommend HHST to target compensatory cognitive strategies to increase safety and functional independence within basic self-care tasks. Education was provided on the results of this evaluation, cognitive communication impairment and recommendation. Pt, her husband and personal caregiver wee in agreement with current POC.     PLAN: Request referral for HHST to target functional cognition   Manaal Mandala B. Dreama Saa, M.S., CCC-SLP, Tree surgeon Certified Brain Injury Specialist [facility]  [facility] Rehabilitation Services Office [phone] Ascom [phone] Fax [phone]

## 2023-11-23 NOTE — Therapy (Signed)
OUTPATIENT PHYSICAL THERAPY TREATMENT       Patient Name: Kendra [name] MRN: [id number] DOB:[birth year], 78 y.o., female Today's Date: 11/23/2023  PCP: Erasmo Downer, MD REFERRING PROVIDER: Erasmo Downer, MD  END OF SESSION:  PT End of Session - 11/23/23 1415     Visit Number 78    Number of Visits 80    Date for PT Re-Evaluation 11/23/23    Authorization Type [employer] PPO    Authorization Time Period 06/10/23-08/05/23    Progress Note Due on Visit 70    PT Start Time 1145    PT Stop Time 1233    PT Time Calculation (min) 48 min    Equipment Utilized During Treatment Gait belt    Activity Tolerance Patient tolerated treatment well;Patient limited by fatigue;Patient limited by pain    Behavior During Therapy [facility] for tasks assessed/performed                      Past Medical History:  Diagnosis Date   Anxiety    Breast cancer (HCC) 03/2018   right breast cancer at 11:00 and 1:00   Bursitis    bilateral hips and knees   Complication of anesthesia    Depression    Family history of breast cancer 03/24/2018   Family history of lung cancer 03/24/2018   History of alcohol dependence (HCC) 2007   no ETOH; resolved since 2007   History of hiatal hernia    History of psychosis 2014   due to sleep disturbance   Hypertension    Parkinson disease (HCC)    Personal history of radiation therapy 06/2018-07/2018   right breast ca   PONV (postoperative nausea and vomiting)    Past Surgical History:  Procedure Laterality Date   BREAST BIOPSY Right 03/14/2018   11:00 DCIS and invasive ductal carcinoma   BREAST BIOPSY Right 03/14/2018   1:00 Invasive ductal carcinoma   BREAST LUMPECTOMY Right 04/27/2018   lumpectomy of 11 and 1:00 cancers, clear margins, negative LN   BREAST LUMPECTOMY WITH RADIOACTIVE SEED AND SENTINEL LYMPH NODE BIOPSY Right 04/27/2018   Procedure: RIGHT BREAST LUMPECTOMY WITH RADIOACTIVE SEED X 2 AND RIGHT SENTINEL LYMPH  NODE BIOPSY;  Surgeon: Glenna Fellows, MD;  Location: [facility];  Service: General;  Laterality: Right;   CATARACT EXTRACTION W/PHACO Left 08/30/2019   Procedure: CATARACT EXTRACTION PHACO AND INTRAOCULAR LENS PLACEMENT (IOC) LEFT panoptix toric  01:20.5  12.7%  10.26;  Surgeon: Lockie Mola, MD;  Location: [facility];  Service: Ophthalmology;  Laterality: Left;   CATARACT EXTRACTION W/PHACO Right 09/20/2019   Procedure: CATARACT EXTRACTION PHACO AND INTRAOCULAR LENS PLACEMENT (IOC) RIGHT 5.71  00:36.4  15.7%;  Surgeon: Lockie Mola, MD;  Location: [facility];  Service: Ophthalmology;  Laterality: Right;   LAPAROTOMY N/A 02/10/2018   Procedure: EXPLORATORY LAPAROTOMY;  Surgeon: Shonna Chock, MD;  Location: [facility];  Service: Gynecology;  Laterality: N/A;   MASS EXCISION  01/2018   abdominal   OVARIAN CYST REMOVAL     SALPINGOOPHORECTOMY Bilateral 02/10/2018   Procedure: BILATERAL SALPINGO OOPHORECTOMY; PERITONEAL WASHINGS;  Surgeon: Shonna Chock, MD;  Location: [facility];  Service: Gynecology;  Laterality: Bilateral;   TONSILLECTOMY     TONSILLECTOMY AND ADENOIDECTOMY  1953   Patient Active Problem List   Diagnosis Date Noted   History of breast cancer 11/22/2023   Lumbar facet joint pain 11/18/2023   Spondylosis without myelopathy or radiculopathy, lumbosacral region  11/18/2023   Excessive daytime sleepiness 10/16/2023   Restless leg syndrome 10/16/2023   Memory impairment 10/16/2023   Tardive dyskinesia 09/09/2023   Snoring 09/09/2023   Neurogenic bladder 05/14/2023   Prediabetes 12/15/2022   Chronic cough 08/17/2022   History of allergy to shellfish 02/24/2022   Chronic low back pain of over 3 months duration 02/04/2022   Osteoarthritis of  AC (acromioclavicular) joint (Left) 02/04/2022   Osteoarthritis of hip (Right) 02/04/2022   Impaction fracture of distal end of radius and ulna, sequela (Left) 02/04/2022   Osteoarthritis  of 1st (thumb) carpometacarpal (CMC) joint of hand (Left) 02/04/2022   Lumbosacral facet arthropathy (Multilevel) (Bilateral) (T12-S1) 02/04/2022   Lumbar central spinal stenosis w/o neurogenic claudication (L2-3, L3-4, L4-5) 02/04/2022   Lumbar foraminal stenosis (Bilateral: L2-3, L4-5) (Right: (Severe) L4-5) 02/04/2022   Lumbar lateral recess stenosis (Bilateral: L2-3, L3-4, and L4-5) (Severe) 02/04/2022   DDD (degenerative disc disease), lumbar 02/04/2022   Lumbosacral facet syndrome (Multilevel) (Bilateral) 02/04/2022   Osteoarthritis of lumbar spine 02/04/2022   Allergic conjunctivitis of both eyes 12/23/2021   Chronic hip pain (3ry area of Pain) (Bilateral) (L>R) 12/22/2021   Chronic lower extremity pain (2ry area of Pain) (Bilateral) (L>R) 12/22/2021   Abnormal MRI, lumbar spine (01/31/2020) 12/22/2021   Grade 1 Anterolisthesis of lumbar spine (L3/L4) & (5 mm) (L4/L5) 12/22/2021   Chronic pain syndrome 11/17/2021   High risk medication use 11/17/2021   Disorder of skeletal system 11/17/2021   Problems influencing health status 11/17/2021   Other hyperlipidemia 10/21/2021   Polypharmacy 08/12/2021   Chronic low back pain (1ry area of Pain) (Bilateral) (L>R) w/o sciatica 06/18/2021   Osteopenia of neck of femur (Right) 09/06/2020   Obesity 08/29/2020   Parkinson disease (HCC) 02/22/2020   Mucinous cystadenoma 12/24/2019   Bipolar disorder, in full remission, most recent episode depressed (HCC) 11/08/2019   Vitamin D insufficiency 08/24/2019   Insomnia 05/31/2019   Bipolar I disorder, most recent episode depressed (HCC) 05/31/2019   Alcohol use disorder, moderate, in sustained remission (HCC) 05/31/2019   Elevated tumor markers 05/27/2019   GAD (generalized anxiety disorder) 04/10/2019   MDD (major depressive disorder), severe (HCC) 01/31/2019   High serum carbohydrate antigen 19-9 (CA19-9) 12/01/2018   Adjustment disorder with anxious mood 11/24/2018   Essential hypertension  11/24/2018   B12 deficiency 10/17/2018   Incisional hernia, without obstruction or gangrene 09/12/2018   Genetic testing 04/07/2018   Family history of breast cancer 03/24/2018   Family history of lung cancer 03/24/2018   Malignant neoplasm of upper-outer quadrant of right breast in female, estrogen receptor positive (HCC) 03/17/2018   Malignant neoplasm of upper-inner quadrant of right breast in female, estrogen receptor positive (HCC) 03/17/2018   History of psychosis 02/07/2018   History of insomnia 02/07/2018    ONSET DATE: Pt diagnosed with PD in 2019  REFERRING DIAG:  G20 (ICD-10-CM) - Parkinson's disease      THERAPY DIAG:  Other low back pain  Muscle weakness (generalized)  Difficulty in walking, not elsewhere classified  Unsteadiness on feet  Pain in left hip  Rationale for Evaluation and Treatment: Rehabilitation  SUBJECTIVE:   Pt presents for last PT visit with spouse and caregiver. All are aware today is last PT session.   PERTINENT HISTORY Pt is a pleasant 78 yo female who was diagnosed with PD in 2019. She is known to clinic and [facility], and was last seen for LSVT BIG Sept-Oct 2022. Pt with chronic pain and has become increasingly  sedentary as a result exacerbating mobility difficulties due to PD. Current concerns: Pt reports L and R hip pain and back pain. Pt was being seen by pain management clinic. Pt has had injections that provided relief at the time. Pt's spouse reports her physician determined surgery was not an option for her pain so the plan is to manage it. Pt spouse thinks pain has resulted in reluctance for pt to move. Pt currently sedentary, doesn't walk much, but when she does she holds on to furniture for support due to balance problems. When pt first stands she must hold on to something due to mix of pain and/or dizziness (described as lightheadedness). Her pain can go from 3/10 to 9/10. It does improve once she starts moving. Pt has increased  difficulty with multiple activities including: getting in and out of her bed, getting in and out of her car, freezing, decreased gait speed, decreased balance and cognition, and difficulty using BUE due to tremors.  Per chart other PMH includes hx of breast cancer, HTN, MCI, insomnia, cataracts, bipolar 1, alcohol use disorder, GAD, MDD, osteopenia of neck of R femur, chronic B low back pain with radiating pain to buttock and posterior thighs and calves. Pt with multiple comorbidities, please refer to chart for full details    PAIN: from eval Are you having pain? Yes 7/10 Left hip   PRECAUTIONS: Fall  WEIGHT BEARING RESTRICTIONS: No  FALLS: Has patient fallen in last 6 months? Yes. Number of falls 1x, leg tangled in chair leg when getting up from a chair  PLOF: Independent with basic ADLs  PATIENT GOALS: improve mobility  OBJECTIVE   TODAY'S TREATMENT:                                                                                                                                         DATE: 11/23/23   Gait belt donned throughout for pt safety  TA:   -PT reviewed/provided education on reasons for discontinuing outpatient services and transitioning to [facility] PT; pt more likely to be successful in this setting with possibility of improved activity levels if services can be tailored to home needs, and discussed importance of caregiver or spouse participation to improve pt's activity levels and consistency with HEP -Education/recommendation for pt to use a 2WW at home, especially when ambulating for time/distance since pt is limited by fatigue. This will decrease likelihood of a fall. -Continued education on importance of increasing activity levels, ambulating, and performing home exercises to maintain gains or improve -PT provided education via explanation and demonstration of safe STS technique to spouse and caregiver  TE: Ambulation with [phone] with close CGA x 6 min, 3 brief standing rest  breaks taken STS 10x with large-amplitude technique    PATIENT EDUCATION:  Education details: Pt educated throughout session about proper posture and technique with exercises. Improved exercise technique, movement at target joints, use  of target muscles after min to mod verbal, visual, tactile cues. Goals, plan.  Person educated: Patient Education method: Explanation, Demonstration, Tactile cues, and Verbal cues Education comprehension: verbalized understanding, returned demonstration, verbal cues required, tactile cues required, and needs further education    HOME EXERCISE PROGRAM:   8//20/24: Access Code: [email or web address] URL: [email or web address] Date: 06/22/2023 Prepared by: Temple Pacini  Exercises - Sit to Stand with Counter Support  - 1 x daily - 4-6 x weekly - 2 sets - 10 reps - Seated Long Arc Quad  - 1 x daily - 4-6 x weekly - 2 sets - 12 reps - Seated Heel Raise  - 1 x daily - 4-6 x weekly - 1 sets - 20 reps    GOALS: Goals reviewed with patient? Yes  SHORT TERM GOALS: Target date: 04/27/2023    Patient will be independent in home exercise program to improve strength/mobility for better functional independence with ADLs. Baseline: LSVT BIG protocol to be initiated next session; 12/18: not yet fully indep, reports she still gets confused about the technique; 12/03/2022: HEP to be updated; Pt reports practicing HEP 2x/week; 02/23/23: not currently performing HEP, reviewed in session and provided new copy; 03/16/2023: pt reports performing HEP every day; 03/30/23: Pt reports she is not being consistent with her HEP; 7/1: pt reports she is not doing much at home; 06/10/23: pt unsure how often she is exercising at home; 10/8: pt repots unsure about her HEP Goal status: MET (previously MET)   LONG TERM GOALS: Target date: 06/08/2023     Patient will increase FOTO score to equal to or greater than 49 to demonstrate statistically significant improvement in mobility and  quality of life.  Baseline: 40; 12/18: deferred due to time; 45; 3/19: deferred to next visit; 01/21/2023; 02/23/2023: 46 unchanged from previous assessment); 03/17/2023: 48; 06/10/23: 52 Goal status: MET  2.  Patient (> 46 years old) will complete five times sit to stand test in < 15 seconds indicating decreased fall risk.  Baseline: 27 sec; 12/18 23 sec hands-free; 11/04/22: 24 sec hands free; 2/1/124: 22.7 sec use of BUE, 21.3 sec hands-free; 01/19/23: 18 seconds hands-free; 02/23/23: 19 seconds hands-free; 03/16/2023: 18 seconds hands-free; 03/30/23: 17.3 seconds hands-free; 05/03/23: 17.3 seconds hands-free; 06/10/23: 19 seconds hands-free; 10/8: 16 seconds hands-free; 09/21/23: 17 sec hands-free; 10/12/23: 20 sec hands-free; 11/16/23: 25.7 sec hands-free, some lightheadedness, SpO2 94%  Goal status: PARTIALLY MET  3.  Patient will increase 10 meter walk test to >1.20m/s with WFL B step-length, upright posture for >90% of test to improve ease with gait and to override hypokentic and bradykinetic movement.  Baseline: 0.58 m/s with 2NF; 12/18: 0.7 m/s with 6OZ; 11/04/22: 0.69 m/s with 3YQ;  12/03/22: 0.73 m/s with 6VH; 01/19/2023: 0.77 m/s with 8IO; 02/23/2023: 0.83 m/s with 9GE; 03/16/2023: 0.83 m/s with 9BM , pt still somewhat crouched, requires cues for upright posture >50% of time; 03/30/23: 0.83 m/s with crouched posture; 05/03/23 0.8 m/s with RW, some improvement with upright posture; 06/10/23:  0.77 m/s with 8UX and crouched posture; 10/8: 0.74 m/s 4WW; 09/21/23: 0.71 m/s with 3KG; 12/10:  0.57 m/s with 4WN; 11/16/2023: 0.58 m/s with 4WW Goal status: IN PROGRESS  4.  Patient will reduce time to don jacket by at least 5 seconds in order to increase ease with dressing and override bradykinetic movement Baseline: 17 seconds; 12/18: 12 seconds Goal status: MET  5.  Pt will demo or report ability to open standard jar using only 1-2 attempts in  order to exhibit improved functional mobility for ADLs.  Baseline: 5.7 seconds  with multiple attempts to turn jar lid to open; 12/18: 2.8 sec opens in 2 turns Goal status: MET  6.  Patient will increase Berg Balance score by > 6 points to demonstrate decreased fall risk during functional activities. Baseline: to be tested next 1-2 visits; 12/03/2022: 44/56; 01/21/23 45/56; 48/56; 03/17/2023: 48/56; 05/03/23: 81/19; 8/8: 48/56; 08/10/2023: deferred; 11/19: 51/56  Goal status: GOAL MET  7.  Patient will report at least 4 days in a row with minimal to no L hip pain in order to improve QOL and ease/safety with functional mobility. Baseline: Pt reports pain majority of days in L hip; 12/03/2022: pt reports having hip pain about every day; 3/19: rates hip pain daily (being seen now by MD, new imaging); 02/23/2023: pt went for period of time without hip pain (she thinks a week), but reports it has now come back,; 5/28: pt reports hip pain "every other day"; 05/03/23: pt reports pain in hip every day in past week Goal status: NOT MET/discontinued   8. Patient will increase six minute walk test distance to >1000 for progression to community ambulator and improve gait ability Baseline:02/23/2023: 722 ft with 4WW and two standing rest breaks, rates test as hard; 03/16/2023: deferred to next visit due to time; 5/28/20204: 641 ft with 4WW, pt requests to end test early due to fatigue at 5 min 35 sec; 05/03/23: 645 ft with 4WW and able to complete full test; 06/10/23: 708 ft; 08/10/23: 632 ft with 3 rest breaks; 11/19: 592 ft with 4WW, pt requests to end test early at about 5 min due to fatigue; 10/21/23: Pt completes full 6 minutes with 4 standing rest breaks, ambulates total of 535 ft with 1YN; 11/16/23: unable to complete full test stops at 3 min 43 sec due to back pain and dizziness/lightheadedness, breathlessness  SpO2% 98% taken following test, completes 318 ft  Goal status: IN PROGRESS   ASSESSMENT:  PT provides extensive education on recommendations/reasons for [facility] to pt, spouse, and caregiver as well  as recommendations and education on appropriate AD to use, and importance of performing HEP and increasing activity levels to maintain gains. Please refer to objective above for details of discussion and recommendations. All are agreeable to discharge on this date with plan to transition to home health services.   OBJECTIVE IMPAIRMENTS: Abnormal gait, decreased balance, decreased cognition, decreased coordination, decreased endurance, decreased knowledge of use of DME, decreased mobility, difficulty walking, decreased strength, dizziness, hypomobility, impaired UE functional use, improper body mechanics, postural dysfunction, and pain.   ACTIVITY LIMITATIONS: lifting, bending, standing, squatting, stairs, transfers, bed mobility, bathing, dressing, hygiene/grooming, and locomotion level  PARTICIPATION LIMITATIONS: meal prep, cleaning, laundry, medication management, personal finances, driving, shopping, community activity, and yard work  PERSONAL FACTORS: Age, Fitness, Sex, Time since onset of injury/illness/exacerbation, and 3+ comorbidities: PMH includes hx of breast cancer, HTN, MCI, insomnia, cataracts, bipolar 1, alcohol use disorder, GAD, MDD, osteopenia of neck of R femur, chronic B low back pain with radiating pain to buttock and posterior thighs and calves. Pt with multiple comorbidities, please refer to chart for full details  are also affecting patient's functional outcome.   REHAB POTENTIAL: Fair    CLINICAL DECISION MAKING: Evolving/moderate complexity  EVALUATION COMPLEXITY: High  PLAN:  PT FREQUENCY: 1-2x/week  PT DURATION: 6 weeks     PLANNED INTERVENTIONS: Therapeutic exercises, Therapeutic activity, Neuromuscular re-education, Balance training, Gait training, Patient/Family education, Self Care, Joint  mobilization, Stair training, Vestibular training, Canalith repositioning, Visual/preceptual remediation/compensation, Orthotic/Fit training, DME instructions, Wheelchair  mobility training, Spinal mobilization, Cryotherapy, Moist heat, Splintting, Taping, and Manual therapy  PLAN FOR NEXT SESSION: d/c   Temple Pacini PT, DPT  Physical Therapist - [facility]  11/23/2023, 2:32 PM

## 2023-11-24 ENCOUNTER — Encounter: Payer: Self-pay | Admitting: Family Medicine

## 2023-11-25 ENCOUNTER — Ambulatory Visit: Payer: Medicare PPO | Admitting: Speech Pathology

## 2023-11-25 MED ORDER — SULFAMETHOXAZOLE-TRIMETHOPRIM 800-160 MG PO TABS: 1.0000 | ORAL_TABLET | Freq: Two times a day (BID) | ORAL | 0 refills | Status: AC

## 2023-11-25 MED ORDER — CEPHALEXIN 500 MG PO CAPS: 500.0000 mg | ORAL_CAPSULE | Freq: Three times a day (TID) | ORAL | 0 refills | Status: DC

## 2023-11-25 NOTE — Telephone Encounter (Signed)
Reviewed results.  Please send Rx for Keflex 500mg  TID x5d #15 r0.  Please ask Neuro to send the sample for urine culture if they still have it.

## 2023-11-25 NOTE — Telephone Encounter (Signed)
See phone note

## 2023-11-25 NOTE — Telephone Encounter (Signed)
Prescription sent

## 2023-11-25 NOTE — Telephone Encounter (Signed)
Please see mychart message.

## 2023-11-25 NOTE — Addendum Note (Signed)
Addended by: Marjie Skiff on: 11/25/2023 04:35 PM   Modules accepted: Orders

## 2023-11-29 ENCOUNTER — Encounter: Payer: Self-pay | Admitting: Family Medicine

## 2023-11-29 ENCOUNTER — Ambulatory Visit: Payer: Medicare PPO | Admitting: Family Medicine

## 2023-11-29 VITALS — BP 126/62 | HR 75 | Ht 66.0 in | Wt 230.0 lb

## 2023-11-29 DIAGNOSIS — R7303 Prediabetes: Secondary | ICD-10-CM | POA: Diagnosis not present

## 2023-11-29 DIAGNOSIS — N3000 Acute cystitis without hematuria: Secondary | ICD-10-CM

## 2023-11-29 DIAGNOSIS — R053 Chronic cough: Secondary | ICD-10-CM | POA: Diagnosis not present

## 2023-11-29 DIAGNOSIS — E7849 Other hyperlipidemia: Secondary | ICD-10-CM | POA: Diagnosis not present

## 2023-11-29 DIAGNOSIS — I10 Essential (primary) hypertension: Secondary | ICD-10-CM | POA: Diagnosis not present

## 2023-11-29 NOTE — Progress Notes (Signed)
Established patient visit   Patient: Kendra [name]   DOB: [birth year]   78 y.o. Female  MRN: [id number] Visit Date: 11/29/2023  Today's healthcare provider: Shirlee Latch, MD   Chief Complaint  Patient presents with   Diarrhea    Pt stated--diarrhea--6 weeks Follow-up-uti--feeling much better   Subjective    HPI HPI     Diarrhea    Additional comments: Pt stated--diarrhea--6 weeks Follow-up-uti--feeling much better      Last edited by Shelly Bombard, CMA on 11/29/2023 10:45 AM.       Discussed the use of AI scribe software for clinical note transcription with the patient, who gave verbal consent to proceed.  History of Present Illness   The patient, with a history of chronic cough and prediabetes, presents with recent episodes of diarrhea. The patient's caregiver reports that the diarrhea episodes are sudden, dramatic, and unanticipated, with the patient not feeling anything coming on. The patient has had two such episodes, one six weeks ago and one on the day of the visit. The patient denies any associated nausea, belly pain, or blood in the stool. The patient is currently on the third day of treatment for a UTI, which could potentially be contributing to the diarrhea.  The patient's chronic cough is described as persistent but not frequent. The caregiver reports hearing a wheezing sound when the patient coughs, which is more noticeable towards the end of the day. The patient has been sleeping in a recliner for the past month, which has reportedly improved her sleep quality.  The patient's prediabetes is being monitored, with recent labs showing a slight decrease in the A1c level from 6 to 5.9. The patient's sodium levels, which had previously been low, have returned to normal. The patient's kidney and liver function tests are also within normal limits.        Medications: Outpatient Medications Prior to Visit  Medication Sig   Acetaminophen 500 MG capsule Take  1,000 mg by mouth 2 (two) times daily.   AMBULATORY NON FORMULARY MEDICATION 1 Bag by Other route daily. HER[facility] smart dispenser   cyanocobalamin (VITAMIN B12) 1000 MCG/ML injection INJECT 1 ML INTO THE MUSCLE EVERY 30 DAYS   denosumab (PROLIA) 60 MG/ML SOSY injection Inject 60 mg into the skin every 6 (six) months. takes annually   FLUoxetine HCl 60 MG TABS Take 1 tablet by mouth daily.   gabapentin (NEURONTIN) 300 MG capsule Take 1 capsule (300 mg total) by mouth 3 (three) times daily.   losartan-hydrochlorothiazide (HYZAAR) 50-12.5 MG tablet Take 1 tablet by mouth daily.   nystatin (MYCOSTATIN/NYSTOP) powder APPLY TO AFFECTED AREA THREE TIMES A DAY   risperiDONE (RISPERDAL) 0.5 MG tablet TAKE 1 TABLET BY MOUTH TWICE A DAY   sulfamethoxazole-trimethoprim (BACTRIM DS) 800-160 MG tablet Take 1 tablet by mouth 2 (two) times daily for 5 days.   traZODone (DESYREL) 100 MG tablet Take 1 tablet (100 mg total) by mouth at bedtime.   No facility-administered medications prior to visit.    Review of Systems     Objective    BP 126/62 (BP Location: Left Arm, Patient Position: Sitting, Cuff Size: Large)   Pulse 75   Ht 5\' 6"  (1.676 m)   Wt 230 lb (104.3 kg)   SpO2 97%   BMI 37.12 kg/m    Physical Exam Vitals reviewed.  Constitutional:      General: She is not in acute distress.    Appearance: Normal appearance. She is  well-developed. She is not diaphoretic.  HENT:     Head: Normocephalic and atraumatic.  Eyes:     General: No scleral icterus.    Conjunctiva/sclera: Conjunctivae normal.  Neck:     Thyroid: No thyromegaly.  Cardiovascular:     Rate and Rhythm: Normal rate and regular rhythm.     Heart sounds: Normal heart sounds. No murmur heard. Pulmonary:     Effort: Pulmonary effort is normal. No respiratory distress.     Breath sounds: Normal breath sounds. No wheezing, rhonchi or rales.  Musculoskeletal:     Cervical back: Neck supple.     Right lower leg: No edema.     Left  lower leg: No edema.  Lymphadenopathy:     Cervical: No cervical adenopathy.  Skin:    General: Skin is warm and dry.     Findings: No rash.  Neurological:     Mental Status: She is alert and oriented to person, place, and time. Mental status is at baseline.  Psychiatric:        Mood and Affect: Mood normal.        Behavior: Behavior normal.      No results found for any visits on 11/29/23.  Assessment & Plan     Problem List Items Addressed This Visit       Cardiovascular and Mediastinum   Essential hypertension     Other   Other hyperlipidemia   Chronic cough - Primary   Relevant Orders   Ambulatory referral to Pulmonology   Prediabetes   Other Visit Diagnoses       Acute cystitis without hematuria           Assessment and Plan    Chronic Cough Persistent cough, more pronounced towards the end of the day and sometimes in the morning. Previous interventions include nasal spray for allergies, GERD medication, and discontinuation of lisinopril. Chest x-ray was unremarkable. Discussed the role of a pulmonologist and potential need for further tests such as a pulmonary function test or CT scan. - Refer to pulmonologist for further evaluation - Consider pulmonary function test or CT scan based on pulmonologist's recommendation  Urinary Tract Infection (UTI) Currently on the third day of Bactrim for UTI. Previous antibiotic was ineffective based on culture sensitivity. Bactrim was chosen based on sensitivity results. Discussed potential gastrointestinal side effects and importance of completing the course. - Continue Bactrim as prescribed - Monitor for severe diarrhea, blood in stool, fever, or intense abdominal pain - Consider probiotic or yogurt to restore gut flora  Diarrhea Sudden episode of diarrhea with fecal incontinence this AM, possibly a side effect of Bactrim or viral infection. No pattern observed. Discussed likelihood of antibiotic-related diarrhea and  importance of monitoring for severe symptoms. - Monitor for changes in diarrhea pattern - Report any signs of blood in stool, fever, or intense abdominal pain - Consider probiotic or yogurt to restore gut flora  Prediabetes A1c decreased from 6.0 to 5.9, indicating improvement but still in the prediabetic range. - Continue current management and lifestyle modifications - Recheck A1c in six months  General Health Maintenance Lipid panel, liver function, and kidney function tests are normal. Sodium levels have returned to normal.  - Continue current lifestyle and dietary habits  Follow-up - Schedule follow-up appointment in six months - Monitor results from pulmonology, MRI, and sleep study - Ensure nerve block procedure is scheduled - Follow up with [facility] if no contact is made.  Return in about 6 months (around 05/28/2024) for AWV, CPE.       Shirlee Latch, MD  [facility] [phone] (phone) [phone] (fax)  [facility]

## 2023-11-30 ENCOUNTER — Ambulatory Visit: Payer: Medicare PPO

## 2023-11-30 ENCOUNTER — Ambulatory Visit: Payer: Medicare PPO | Admitting: Speech Pathology

## 2023-11-30 NOTE — Telephone Encounter (Signed)
Called the pt's spouse and there was no answer- LMTCB

## 2023-11-30 NOTE — Telephone Encounter (Signed)
Husband is trying to return missed call. He is ready to schedule a sleep study for his wife.

## 2023-11-30 NOTE — Telephone Encounter (Signed)
Kendra [name], please advise on msg below about pushing sleep study back thanks

## 2023-11-30 NOTE — Telephone Encounter (Signed)
That's fine with me if it will make her more comfortable. Please reschedule her follow up with me as well. Thanks.

## 2023-12-01 NOTE — Telephone Encounter (Signed)
I spoke with Amy with [facility] and they will be reaching back out to the husband again today. They were the ones that called yesterday

## 2023-12-02 ENCOUNTER — Ambulatory Visit: Payer: Medicare PPO | Admitting: Speech Pathology

## 2023-12-03 NOTE — Telephone Encounter (Signed)
 NFN

## 2023-12-06 ENCOUNTER — Ambulatory Visit
Admission: RE | Admit: 2023-12-06 | Discharge: 2023-12-06 | Disposition: A | Discharge status: Home / Self Care | Payer: Medicare PPO | Source: Ambulatory Visit | Attending: Pain Medicine | Admitting: Pain Medicine

## 2023-12-06 ENCOUNTER — Ambulatory Visit
Admission: RE | Admit: 2023-12-06 | Discharge: 2023-12-06 | Disposition: A | Discharge status: Home / Self Care | Payer: Medicare PPO | Source: Ambulatory Visit | Attending: Student | Admitting: Student

## 2023-12-06 ENCOUNTER — Telehealth: Payer: Self-pay

## 2023-12-06 ENCOUNTER — Encounter: Payer: Self-pay | Admitting: Internal Medicine

## 2023-12-06 DIAGNOSIS — M542 Cervicalgia: Secondary | ICD-10-CM | POA: Diagnosis not present

## 2023-12-06 DIAGNOSIS — M47816 Spondylosis without myelopathy or radiculopathy, lumbar region: Secondary | ICD-10-CM

## 2023-12-06 DIAGNOSIS — Z91013 Allergy to seafood: Secondary | ICD-10-CM

## 2023-12-06 DIAGNOSIS — R4189 Other symptoms and signs involving cognitive functions and awareness: Secondary | ICD-10-CM

## 2023-12-06 DIAGNOSIS — M5459 Other low back pain: Secondary | ICD-10-CM

## 2023-12-06 DIAGNOSIS — Z853 Personal history of malignant neoplasm of breast: Secondary | ICD-10-CM

## 2023-12-06 DIAGNOSIS — M47817 Spondylosis without myelopathy or radiculopathy, lumbosacral region: Secondary | ICD-10-CM

## 2023-12-06 DIAGNOSIS — G8929 Other chronic pain: Secondary | ICD-10-CM

## 2023-12-06 DIAGNOSIS — M4316 Spondylolisthesis, lumbar region: Secondary | ICD-10-CM

## 2023-12-06 DIAGNOSIS — G2401 Drug induced subacute dyskinesia: Secondary | ICD-10-CM

## 2023-12-06 DIAGNOSIS — W19XXXA Unspecified fall, initial encounter: Secondary | ICD-10-CM

## 2023-12-06 DIAGNOSIS — M5416 Radiculopathy, lumbar region: Secondary | ICD-10-CM | POA: Diagnosis not present

## 2023-12-06 DIAGNOSIS — M545 Low back pain, unspecified: Secondary | ICD-10-CM

## 2023-12-06 NOTE — Telephone Encounter (Signed)
Copied from CRM [id number]. Topic: Appointments - Other >> Dec 06, 2023 12:06 PM Priscille Loveless wrote: Pt wants to know if Rhunette Croft is the plumonary specialist that Dr B wants them to see. She already has appt with her to discuss sleep study but wanted to know could this dr handle the other plumary issues.

## 2023-12-07 ENCOUNTER — Encounter: Payer: Medicare PPO | Admitting: Speech Pathology

## 2023-12-07 ENCOUNTER — Ambulatory Visit: Payer: Medicare PPO

## 2023-12-07 NOTE — Telephone Encounter (Signed)
Pt husband advised. Verbalized understanding

## 2023-12-09 ENCOUNTER — Encounter: Payer: Medicare PPO | Admitting: Speech Pathology

## 2023-12-10 ENCOUNTER — Ambulatory Visit: Payer: Medicare PPO | Admitting: Nurse Practitioner

## 2023-12-10 DIAGNOSIS — F319 Bipolar disorder, unspecified: Secondary | ICD-10-CM | POA: Diagnosis not present

## 2023-12-10 DIAGNOSIS — M5416 Radiculopathy, lumbar region: Secondary | ICD-10-CM | POA: Diagnosis not present

## 2023-12-10 DIAGNOSIS — R4189 Other symptoms and signs involving cognitive functions and awareness: Secondary | ICD-10-CM | POA: Diagnosis not present

## 2023-12-10 DIAGNOSIS — G20C Parkinsonism, unspecified: Secondary | ICD-10-CM | POA: Diagnosis not present

## 2023-12-10 DIAGNOSIS — L219 Seborrheic dermatitis, unspecified: Secondary | ICD-10-CM | POA: Diagnosis not present

## 2023-12-10 DIAGNOSIS — I10 Essential (primary) hypertension: Secondary | ICD-10-CM | POA: Diagnosis not present

## 2023-12-10 DIAGNOSIS — G8929 Other chronic pain: Secondary | ICD-10-CM | POA: Diagnosis not present

## 2023-12-10 DIAGNOSIS — M542 Cervicalgia: Secondary | ICD-10-CM | POA: Diagnosis not present

## 2023-12-10 DIAGNOSIS — G2401 Drug induced subacute dyskinesia: Secondary | ICD-10-CM | POA: Diagnosis not present

## 2023-12-13 NOTE — Progress Notes (Signed)
PROVIDER NOTE: Interpretation of information contained herein should be left to medically-trained personnel. Specific patient instructions are provided elsewhere under "Patient Instructions" section of medical record. This document was created in part using STT-dictation technology, any transcriptional errors that may result from this process are unintentional.  Patient: Kendra [name] Type: Established DOB: Nov 25, 1945 MRN: [id number] PCP: Erasmo Downer, MD  Service: Procedure DOS: 12/14/2023 Setting: Ambulatory Location: Ambulatory outpatient facility Delivery: Face-to-face Provider: Oswaldo Done, MD Specialty: Interventional Pain Management Specialty designation: 09 Location: Outpatient facility Ref. Prov.: Bacigalupo, Marzella Schlein, MD       Interventional Therapy   Type: Lumbar Facet, Medial Branch Block(s) (w/ fluoroscopic mapping) #1  Laterality: Bilateral  Level: L2, L3, L4, L5, and S1 Medial Branch Level(s). Injecting these levels blocks the L3-4, L4-5, and L5-S1 lumbar facet joints. Imaging: Fluoroscopic guidance Spinal ([id number]) Anesthesia: Local anesthesia (1-2% Lidocaine) Anxiolysis: IV Versed 2.0 mg Sedation: Moderate Sedation Fentanyl 1 mL (50 mcg) DOS: 12/14/2023 Performed by: Oswaldo Done, MD  Primary Purpose: Diagnostic/Therapeutic Indications: Low back pain severe enough to impact quality of life or function. 1. Chronic low back pain (1ry area of Pain) (Bilateral) (L>R) w/o sciatica   2. Grade 1 Anterolisthesis of lumbar spine (L3/L4) & (5 mm) (L4/L5)   3. Lumbar facet joint pain   4. Lumbosacral facet arthropathy (Multilevel) (Bilateral) (T12-S1)   5. Lumbosacral facet syndrome (Multilevel) (Bilateral)   6. Spondylosis without myelopathy or radiculopathy, lumbosacral region    NAS-11 Pain score:   Pre-procedure: 7 /10   Post-procedure: 0-No pain/10     Position / Prep / Materials:  Position: Prone  Prep solution: ChloraPrep (2%  chlorhexidine gluconate and 70% isopropyl alcohol) Area Prepped: Posterolateral Lumbosacral Spine (Wide prep: From the lower border of the scapula down to the end of the tailbone and from flank to flank.)  Materials:  Tray: Block Needle(s):  Type: Spinal  Gauge (G): 22  Length: 5-in Qty: 4      H&P (Pre-op Assessment):  Kendra [name] is a 78 y.o. (year old), female patient, seen today for interventional treatment. She  has a past surgical history that includes Ovarian cyst removal; Tonsillectomy and adenoidectomy (4782); Tonsillectomy; laparotomy (N/A, 02/10/2018); Salpingoophorectomy (Bilateral, 02/10/2018); Mass excision (01/2018); Breast lumpectomy with radioactive seed and sentinel lymph node biopsy (Right, 04/27/2018); Cataract extraction w/PHACO (Left, 08/30/2019); Cataract extraction w/PHACO (Right, 09/20/2019); Breast biopsy (Right, 03/14/2018); Breast biopsy (Right, 03/14/2018); and Breast lumpectomy (Right, 04/27/2018). Kendra [name] has a current medication list which includes the following prescription(s): acetaminophen, AMBULATORY NON FORMULARY MEDICATION, cyanocobalamin, denosumab, fluoxetine hcl, gabapentin, losartan-hydrochlorothiazide, nystatin, risperidone, and trazodone, and the following Facility-Administered Medications: fentanyl. Her primarily concern today is the Back Pain  Initial Vital Signs:  Pulse/HCG Rate: 70ECG Heart Rate: 68 (nsr) Temp: 98.1 F (36.7 C) Resp: 16 BP: (!) 151/72 SpO2: 97 %  BMI: Estimated body mass index is 37.12 kg/m as calculated from the following:   Height as of this encounter: 5\' 6"  (1.676 m).   Weight as of this encounter: 230 lb (104.3 kg).  Risk Assessment: Allergies: Reviewed. She is allergic to hydroxyzine, other, and shrimp [shellfish allergy].  Allergy Precautions: None required Coagulopathies: Reviewed. None identified.  Blood-thinner therapy: None at this time Active Infection(s): Reviewed. None identified. Kendra [name] is  afebrile  Site Confirmation: Kendra [name] was asked to confirm the procedure and laterality before marking the site Procedure checklist: Completed Consent: Before the procedure and under the influence of no sedative(s), amnesic(s), or anxiolytics, the patient was informed  of the treatment options, risks and possible complications. To fulfill our ethical and legal obligations, as recommended by the American Medical Association's Code of Ethics, I have informed the patient of my clinical impression; the nature and purpose of the treatment or procedure; the risks, benefits, and possible complications of the intervention; the alternatives, including doing nothing; the risk(s) and benefit(s) of the alternative treatment(s) or procedure(s); and the risk(s) and benefit(s) of doing nothing. The patient was provided information about the general risks and possible complications associated with the procedure. These may include, but are not limited to: failure to achieve desired goals, infection, bleeding, organ or nerve damage, allergic reactions, paralysis, and death. In addition, the patient was informed of those risks and complications associated to Spine-related procedures, such as failure to decrease pain; infection (i.e.: Meningitis, epidural or intraspinal abscess); bleeding (i.e.: epidural hematoma, subarachnoid hemorrhage, or any other type of intraspinal or peri-dural bleeding); organ or nerve damage (i.e.: Any type of peripheral nerve, nerve root, or spinal cord injury) with subsequent damage to sensory, motor, and/or autonomic systems, resulting in permanent pain, numbness, and/or weakness of one or several areas of the body; allergic reactions; (i.e.: anaphylactic reaction); and/or death. Furthermore, the patient was informed of those risks and complications associated with the medications. These include, but are not limited to: allergic reactions (i.e.: anaphylactic or anaphylactoid reaction(s)); adrenal  axis suppression; blood sugar elevation that in diabetics may result in ketoacidosis or comma; water retention that in patients with history of congestive heart failure may result in shortness of breath, pulmonary edema, and decompensation with resultant heart failure; weight gain; swelling or edema; medication-induced neural toxicity; particulate matter embolism and blood vessel occlusion with resultant organ, and/or nervous system infarction; and/or aseptic necrosis of one or more joints. Finally, the patient was informed that Medicine is not an exact science; therefore, there is also the possibility of unforeseen or unpredictable risks and/or possible complications that may result in a catastrophic outcome. The patient indicated having understood very clearly. We have given the patient no guarantees and we have made no promises. Enough time was given to the patient to ask questions, all of which were answered to the patient's satisfaction. Ms. Dohrmann has indicated that she wanted to continue with the procedure. Attestation: I, the ordering provider, attest that I have discussed with the patient the benefits, risks, side-effects, alternatives, likelihood of achieving goals, and potential problems during recovery for the procedure that I have provided informed consent. Date  Time: 12/14/2023  8:44 AM  Pre-Procedure Preparation:  Monitoring: As per clinic protocol. Respiration, ETCO2, SpO2, BP, heart rate and rhythm monitor placed and checked for adequate function Safety Precautions: Patient was assessed for positional comfort and pressure points before starting the procedure. Time-out: I initiated and conducted the "Time-out" before starting the procedure, as per protocol. The patient was asked to participate by confirming the accuracy of the "Time Out" information. Verification of the correct person, site, and procedure were performed and confirmed by me, the nursing staff, and the patient. "Time-out"  conducted as per Joint Commission's Universal Protocol (UP.01.01.01). Time: 1052 Start Time: 1052 hrs.  Description of Procedure:          Laterality: (see above) Targeted Levels: (see above)  Safety Precautions: Aspiration looking for blood return was conducted prior to all injections. At no point did we inject any substances, as a needle was being advanced. Before injecting, the patient was told to immediately notify me if she was experiencing any new onset  of "ringing in the ears, or metallic taste in the mouth". No attempts were made at seeking any paresthesias. Safe injection practices and needle disposal techniques used. Medications properly checked for expiration dates. SDV (single dose vial) medications used. After the completion of the procedure, all disposable equipment used was discarded in the proper designated medical waste containers. Local Anesthesia: Protocol guidelines were followed. The patient was positioned over the fluoroscopy table. The area was prepped in the usual manner. The time-out was completed. The target area was identified using fluoroscopy. A 12-in long, straight, sterile hemostat was used with fluoroscopic guidance to locate the targets for each level blocked. Once located, the skin was marked with an approved surgical skin marker. Once all sites were marked, the skin (epidermis, dermis, and hypodermis), as well as deeper tissues (fat, connective tissue and muscle) were infiltrated with a small amount of a short-acting local anesthetic, loaded on a 10cc syringe with a 25G, 1.5-in  Needle. An appropriate amount of time was allowed for local anesthetics to take effect before proceeding to the next step. Local Anesthetic: Lidocaine 2.0% The unused portion of the local anesthetic was discarded in the proper designated containers. Technical description of process:  L2 Medial Branch Nerve Block (MBB): The target area for the L2 medial branch is at the junction of the  postero-lateral aspect of the superior articular process and the superior, posterior, and medial edge of the transverse process of L3. Under fluoroscopic guidance, a Quincke needle was inserted until contact was made with os over the superior postero-lateral aspect of the pedicular shadow (target area). After negative aspiration for blood, 0.5 mL of the nerve block solution was injected without difficulty or complication. The needle was removed intact. L3 Medial Branch Nerve Block (MBB): The target area for the L3 medial branch is at the junction of the postero-lateral aspect of the superior articular process and the superior, posterior, and medial edge of the transverse process of L4. Under fluoroscopic guidance, a Quincke needle was inserted until contact was made with os over the superior postero-lateral aspect of the pedicular shadow (target area). After negative aspiration for blood, 0.5 mL of the nerve block solution was injected without difficulty or complication. The needle was removed intact. L4 Medial Branch Nerve Block (MBB): The target area for the L4 medial branch is at the junction of the postero-lateral aspect of the superior articular process and the superior, posterior, and medial edge of the transverse process of L5. Under fluoroscopic guidance, a Quincke needle was inserted until contact was made with os over the superior postero-lateral aspect of the pedicular shadow (target area). After negative aspiration for blood, 0.5 mL of the nerve block solution was injected without difficulty or complication. The needle was removed intact. L5 Medial Branch Nerve Block (MBB): The target area for the L5 medial branch is at the junction of the postero-lateral aspect of the superior articular process and the superior, posterior, and medial edge of the sacral ala. Under fluoroscopic guidance, a Quincke needle was inserted until contact was made with os over the superior postero-lateral aspect of the  pedicular shadow (target area). After negative aspiration for blood, 0.5 mL of the nerve block solution was injected without difficulty or complication. The needle was removed intact. S1 Medial Branch Nerve Block (MBB): The target area for the S1 medial branch is at the posterior and inferior 6 o'clock position of the L5-S1 facet joint. Under fluoroscopic guidance, the Quincke needle inserted for the L5 MBB was redirected  until contact was made with os over the inferior and postero aspect of the sacrum, at the 6 o' clock position under the L5-S1 facet joint (Target area). After negative aspiration for blood, 0.5 mL of the nerve block solution was injected without difficulty or complication. The needle was removed intact.  Once the entire procedure was completed, the treated area was cleaned, making sure to leave some of the prepping solution back to take advantage of its long term bactericidal properties.         Illustration of the posterior view of the lumbar spine and the posterior neural structures. Laminae of L2 through S1 are labeled. DPRL5, dorsal primary ramus of L5; DPRS1, dorsal primary ramus of S1; DPR3, dorsal primary ramus of L3; FJ, facet (zygapophyseal) joint L3-L4; I, inferior articular process of L4; LB1, lateral branch of dorsal primary ramus of L1; IAB, inferior articular branches from L3 medial branch (supplies L4-L5 facet joint); IBP, intermediate branch plexus; MB3, medial branch of dorsal primary ramus of L3; NR3, third lumbar nerve root; S, superior articular process of L5; SAB, superior articular branches from L4 (supplies L4-5 facet joint also); TP3, transverse process of L3.   Facet Joint Innervation (* possible contribution)  L1-2 T12, L1 (L2*)  Medial Branch  L2-3 L1, L2 (L3*)         "          "  L3-4 L2, L3 (L4*)         "          "  L4-5 L3, L4 (L5*)         "          "  L5-S1 L4, L5, S1          "          "    Vitals:   12/14/23 1052 12/14/23 1057 12/14/23  1102 12/14/23 1107  BP: (!) 176/103 (!) 199/99 (!) 151/95 (!) 142/97  Pulse:      Resp: 18 17 17 15   Temp:      TempSrc:      SpO2: 100% 99% 100% 100%  Weight:      Height:         End Time: 1106 hrs.  Imaging Guidance (Spinal):          Type of Imaging Technique: Fluoroscopy Guidance (Spinal) Indication(s): Fluoroscopy guidance for needle placement to enhance accuracy in procedures requiring precise needle localization for targeted delivery of medication in or near specific anatomical locations not easily accessible without such real-time imaging assistance. Exposure Time: Please see nurses notes. Contrast: None used. Fluoroscopic Guidance: I was personally present during the use of fluoroscopy. "Tunnel Vision Technique" used to obtain the best possible view of the target area. Parallax error corrected before commencing the procedure. "Direction-depth-direction" technique used to introduce the needle under continuous pulsed fluoroscopy. Once target was reached, antero-posterior, oblique, and lateral fluoroscopic projection used confirm needle placement in all planes. Images permanently stored in EMR. Interpretation: No contrast injected. I personally interpreted the imaging intraoperatively. Adequate needle placement confirmed in multiple planes. Permanent images saved into the patient's record.  Post-operative Assessment:  Post-procedure Vital Signs:  Pulse/HCG Rate: 7069 (nsr) Temp: 98.1 F (36.7 C) Resp: 15 BP: (!) 142/97 SpO2: 100 %  EBL: None  Complications: No immediate post-treatment complications observed by team, or reported by patient.  Note: The patient tolerated the entire procedure well. A repeat set of vitals were taken after the  procedure and the patient was kept under observation following institutional policy, for this type of procedure. Post-procedural neurological assessment was performed, showing return to baseline, prior to discharge. The patient was provided  with post-procedure discharge instructions, including a section on how to identify potential problems. Should any problems arise concerning this procedure, the patient was given instructions to immediately contact us, at any time, without hesitation. In any case, we plan to contact the patient by telephone for a follow-up status report regarding this interventional procedure.  Comments:  No additional relevant information.  Plan of Care (POC)  Orders:  Orders Placed This Encounter  Procedures   LUMBAR FACET(MEDIAL BRANCH NERVE BLOCK) MBNB    Scheduling Instructions:     Procedure: Lumbar facet block (AKA.: Lumbosacral medial branch nerve block)     Side: Bilateral     Level: L3-4, L4-5, L5-S1, and TBD Facets (L2, L3, L4, L5, S1, and TBD Medial Branch Nerves)     Sedation: Patient's choice.     Timeframe: Today    Where will this procedure be performed?:   [facility]   DG [facility] C-ARM 1-60 MIN NO REPORT    Intraoperative interpretation by procedural physician at [facility].    Standing Status:   Standing    Number of Occurrences:   1    Reason for exam::   Assistance in needle guidance and placement for procedures requiring needle placement in or near specific anatomical locations not easily accessible without such assistance.   Informed Consent Details: Physician/Practitioner Attestation; Transcribe to consent form and obtain patient signature    Nursing Order: Transcribe to consent form and obtain patient signature. Note: Always confirm laterality of pain with Ms. Kaestner, before procedure.    Physician/Practitioner attestation of informed consent for procedure/surgical case:   I, the physician/practitioner, attest that I have discussed with the patient the benefits, risks, side effects, alternatives, likelihood of achieving goals and potential problems during recovery for the procedure that I have provided informed consent.    Procedure:   Lumbar Facet Block   under fluoroscopic guidance    Physician/Practitioner performing the procedure:   Mischa Pollard A. Laban Emperor MD    Indication/Reason:   Low Back Pain, with our without leg pain, due to Facet Joint Arthralgia (Joint Pain) Spondylosis (Arthritis of the Spine), without myelopathy or radiculopathy (Nerve Damage).   Provide equipment / supplies at bedside    Procedure tray: "Block Tray" (Disposable  single use) Skin infiltration needle: Regular 1.5-in, 25-G, (x1) Block Needle type: Spinal Amount/quantity: 4 Size: Medium (5-inch) Gauge: 22G    Standing Status:   Standing    Number of Occurrences:   1    Specify:   Block Tray   Saline lock IV    Have LR [phone] mL available and administer at 125 mL/hr if patient becomes hypotensive.    Standing Status:   Standing    Number of Occurrences:   1   Chronic Opioid Analgesic:  No chronic opioid analgesics therapy prescribed by our practice. Tramadol 50 mg tablet, 1 tab p.o. twice daily MME/day: 10 mg/day   Medications ordered for procedure: Meds ordered this encounter  Medications   lidocaine (XYLOCAINE) 2 % (with pres) injection 400 mg   pentafluoroprop-tetrafluoroeth (GEBAUERS) aerosol   midazolam (VERSED) 5 MG/5ML injection 0.5-2 mg    Make sure Flumazenil is available in the pyxis when using this medication. If oversedation occurs, administer 0.2 mg IV over 15 sec. If after 45 sec no  response, administer 0.2 mg again over 1 min; may repeat at 1 min intervals; not to exceed 4 doses (1 mg)   fentaNYL (SUBLIMAZE) injection 25-50 mcg    Make sure Narcan is available in the pyxis when using this medication. In the event of respiratory depression (RR< 8/min): Titrate NARCAN (naloxone) in increments of 0.1 to 0.2 mg IV at 2-3 minute intervals, until desired degree of reversal.   ropivacaine (PF) 2 mg/mL (0.2%) (NAROPIN) injection 18 mL   triamcinolone acetonide (KENALOG-40) injection 80 mg   Medications administered: We administered lidocaine,  pentafluoroprop-tetrafluoroeth, midazolam, fentaNYL, ropivacaine (PF) 2 mg/mL (0.2%), and triamcinolone acetonide.  See the medical record for exact dosing, route, and time of administration.  Follow-up plan:   Return in about 2 weeks (around 12/28/2023) for (Face2F), (PPE).       Interventional Therapies  Risk  Complexity Considerations:   Allergy to shellfish    Planned  Pending:   Diagnostic bilateral lumbar facet MBB #1 (12/14/2023)    Under consideration:   Diagnostic left L2-3 LESI #3  Diagnostic midline L3-4 LESI #1  Diagnostic bilateral lumbar facet MBB #1    Completed:   Diagnostic/diagnostic left L2-3 LESI x2 (01/12/2023) (1st:100/100/75/75) (2nd: 25/25/50/50)    Completed by other providers:   (06/27/2021) bilateral L5-S1 transforaminal ESI (75% relief) by Merri Ray, DO (08/14/2021) left L5-S1 transforaminal ESI + left S1 TFESI by Merri Ray, DO   Therapeutic  Palliative (PRN) options:   None established     Recent Visits Date Type Provider Dept  11/22/23 Office Visit Delano Metz, MD [facility]  Showing recent visits within past 90 days and meeting all other requirements Today's Visits Date Type Provider Dept  12/14/23 Procedure visit Delano Metz, MD [facility]  Showing today's visits and meeting all other requirements Future Appointments Date Type Provider Dept  01/03/24 Appointment Delano Metz, MD [facility]  Showing future appointments within next 90 days and meeting all other requirements  Disposition: Discharge home  Discharge (Date  Time): 12/14/2023;   hrs.   Primary Care Physician: Erasmo Downer, MD Location: [facility] Note by: Oswaldo Done, MD (TTS technology used. I apologize for any typographical errors that were not detected and corrected.) Date: 12/14/2023; Time: 11:10 AM  Disclaimer:  Medicine is not an Visual merchandiser. The only  guarantee in medicine is that nothing is guaranteed. It is important to note that the decision to proceed with this intervention was based on the information collected from the patient. The Data and conclusions were drawn from the patient's questionnaire, the interview, and the physical examination. Because the information was provided in large part by the patient, it cannot be guaranteed that it has not been purposely or unconsciously manipulated. Every effort has been made to obtain as much relevant data as possible for this evaluation. It is important to note that the conclusions that lead to this procedure are derived in large part from the available data. Always take into account that the treatment will also be dependent on availability of resources and existing treatment guidelines, considered by other Pain Management Practitioners as being common knowledge and practice, at the time of the intervention. For Medico-Legal purposes, it is also important to point out that variation in procedural techniques and pharmacological choices are the acceptable norm. The indications, contraindications, technique, and results of the above procedure should only be interpreted and judged by a Board-Certified Interventional Pain Specialist with extensive familiarity and expertise  in the same exact procedure and technique.

## 2023-12-14 ENCOUNTER — Ambulatory Visit
Admission: RE | Admit: 2023-12-14 | Discharge: 2023-12-14 | Disposition: A | Discharge status: Home / Self Care | Payer: Medicare PPO | Source: Ambulatory Visit | Attending: Pain Medicine | Admitting: Pain Medicine

## 2023-12-14 ENCOUNTER — Ambulatory Visit: Payer: Self-pay | Admitting: Psychiatry

## 2023-12-14 ENCOUNTER — Ambulatory Visit: Payer: Medicare PPO

## 2023-12-14 ENCOUNTER — Encounter: Payer: Medicare PPO | Admitting: Speech Pathology

## 2023-12-14 ENCOUNTER — Ambulatory Visit: Payer: Medicare PPO | Attending: Pain Medicine | Admitting: Pain Medicine

## 2023-12-14 ENCOUNTER — Encounter: Payer: Self-pay | Admitting: Pain Medicine

## 2023-12-14 VITALS — BP 151/68 | HR 70 | Temp 98.1°F | Resp 15 | Ht 66.0 in | Wt 230.0 lb

## 2023-12-14 DIAGNOSIS — M47817 Spondylosis without myelopathy or radiculopathy, lumbosacral region: Secondary | ICD-10-CM

## 2023-12-14 DIAGNOSIS — M5459 Other low back pain: Secondary | ICD-10-CM

## 2023-12-14 DIAGNOSIS — M4316 Spondylolisthesis, lumbar region: Secondary | ICD-10-CM

## 2023-12-14 DIAGNOSIS — G8929 Other chronic pain: Secondary | ICD-10-CM

## 2023-12-14 DIAGNOSIS — M47816 Spondylosis without myelopathy or radiculopathy, lumbar region: Secondary | ICD-10-CM | POA: Diagnosis not present

## 2023-12-14 DIAGNOSIS — M545 Low back pain, unspecified: Secondary | ICD-10-CM | POA: Diagnosis not present

## 2023-12-14 MED ORDER — FENTANYL CITRATE (PF) 100 MCG/2ML IJ SOLN
INTRAMUSCULAR | Status: AC
  Filled 2023-12-14: qty 2

## 2023-12-14 MED ORDER — MIDAZOLAM HCL 5 MG/5ML IJ SOLN
0.5000 mg | Freq: Once | INTRAMUSCULAR | Status: AC
  Administered 2023-12-14: 1 mg via INTRAVENOUS

## 2023-12-14 MED ORDER — LIDOCAINE HCL 2 % IJ SOLN
INTRAMUSCULAR | Status: AC
Start: 2023-12-14 — End: ?
  Filled 2023-12-14: qty 20

## 2023-12-14 MED ORDER — TRIAMCINOLONE ACETONIDE 40 MG/ML IJ SUSP
80.0000 mg | Freq: Once | INTRAMUSCULAR | Status: AC
  Administered 2023-12-14: 80 mg

## 2023-12-14 MED ORDER — TRIAMCINOLONE ACETONIDE 40 MG/ML IJ SUSP
INTRAMUSCULAR | Status: AC
  Filled 2023-12-14: qty 2

## 2023-12-14 MED ORDER — MIDAZOLAM HCL 5 MG/5ML IJ SOLN
INTRAMUSCULAR | Status: AC
  Filled 2023-12-14: qty 5

## 2023-12-14 MED ORDER — ROPIVACAINE HCL 2 MG/ML IJ SOLN
18.0000 mL | Freq: Once | INTRAMUSCULAR | Status: AC
  Administered 2023-12-14: 18 mL via PERINEURAL

## 2023-12-14 MED ORDER — PENTAFLUOROPROP-TETRAFLUOROETH EX AERO
INHALATION_SPRAY | Freq: Once | CUTANEOUS | Status: AC
  Administered 2023-12-14: 30 via TOPICAL

## 2023-12-14 MED ORDER — ROPIVACAINE HCL 2 MG/ML IJ SOLN
INTRAMUSCULAR | Status: AC
  Filled 2023-12-14: qty 20

## 2023-12-14 MED ORDER — FENTANYL CITRATE (PF) 100 MCG/2ML IJ SOLN
25.0000 ug | INTRAMUSCULAR | Status: DC | PRN
  Administered 2023-12-14: 50 ug via INTRAVENOUS

## 2023-12-14 MED ORDER — LIDOCAINE HCL 2 % IJ SOLN
20.0000 mL | Freq: Once | INTRAMUSCULAR | Status: AC
  Administered 2023-12-14: 400 mg

## 2023-12-14 NOTE — Progress Notes (Signed)
Safety precautions to be maintained throughout the outpatient stay will include: orient to surroundings, keep bed in low position, maintain call bell within reach at all times, provide assistance with transfer out of bed and ambulation.

## 2023-12-14 NOTE — Patient Instructions (Signed)

## 2023-12-15 ENCOUNTER — Telehealth: Payer: Self-pay | Admitting: *Deleted

## 2023-12-15 NOTE — Telephone Encounter (Signed)
Attempted to call for post procedure follow-up. Message left.

## 2023-12-16 ENCOUNTER — Encounter: Payer: Medicare PPO | Admitting: Speech Pathology

## 2023-12-16 DIAGNOSIS — L219 Seborrheic dermatitis, unspecified: Secondary | ICD-10-CM | POA: Diagnosis not present

## 2023-12-16 DIAGNOSIS — R4189 Other symptoms and signs involving cognitive functions and awareness: Secondary | ICD-10-CM | POA: Diagnosis not present

## 2023-12-16 DIAGNOSIS — F319 Bipolar disorder, unspecified: Secondary | ICD-10-CM | POA: Diagnosis not present

## 2023-12-16 DIAGNOSIS — M5416 Radiculopathy, lumbar region: Secondary | ICD-10-CM | POA: Diagnosis not present

## 2023-12-16 DIAGNOSIS — G20C Parkinsonism, unspecified: Secondary | ICD-10-CM | POA: Diagnosis not present

## 2023-12-16 DIAGNOSIS — I10 Essential (primary) hypertension: Secondary | ICD-10-CM | POA: Diagnosis not present

## 2023-12-16 DIAGNOSIS — G2401 Drug induced subacute dyskinesia: Secondary | ICD-10-CM | POA: Diagnosis not present

## 2023-12-16 DIAGNOSIS — M542 Cervicalgia: Secondary | ICD-10-CM | POA: Diagnosis not present

## 2023-12-16 DIAGNOSIS — G8929 Other chronic pain: Secondary | ICD-10-CM | POA: Diagnosis not present

## 2023-12-17 ENCOUNTER — Telehealth: Payer: Self-pay

## 2023-12-17 NOTE — Telephone Encounter (Signed)
Copied from CRM [id number]. Topic: General - Other >> Dec 16, 2023 11:23 AM Marlow Baars wrote: Reason for CRM: The husband of the patient called in stating he is not happy with the quality of care from their home health care experience. He states he would like to speak with the provider or her nurse about this further. He states his wife has been seen by [facility] and there was an incident with a gentleman who came for her PT evaluation. Also they were told they do not have an office in [place] or a woman locally for them and that is of deep concern. He states he was told this home health order should be for Speech, Physical and Occupational therapy. The husband states he spoke with his wife and that she absolutely wants a female therapist and not a female as well. Please assist further

## 2023-12-20 NOTE — Telephone Encounter (Signed)
We can re-refer to [facility] if they want a different agency, but her insurance determines which agency she can use.

## 2023-12-21 ENCOUNTER — Ambulatory Visit: Payer: Medicare PPO

## 2023-12-21 ENCOUNTER — Encounter: Payer: Medicare PPO | Admitting: Speech Pathology

## 2023-12-22 NOTE — Telephone Encounter (Signed)
 Detailed VM left per DPR. CRM created. Ok for [facility] to advise/clarify if patient returns call

## 2023-12-23 ENCOUNTER — Encounter: Payer: Medicare PPO | Admitting: Speech Pathology

## 2023-12-23 ENCOUNTER — Telehealth: Payer: Self-pay | Admitting: Student

## 2023-12-23 NOTE — Telephone Encounter (Signed)
PT wants to resched a Sleep Study set for tomorrow. His # is [phone]  States he will need to resched the appt w/Ms. Cobb after we know when sleep study will be.

## 2023-12-23 NOTE — Telephone Encounter (Signed)
Please reschedule sleep study and notify front once schedule so appt can be rescheduled. Thanks

## 2023-12-24 ENCOUNTER — Ambulatory Visit: Payer: Medicare PPO | Admitting: Nurse Practitioner

## 2023-12-24 DIAGNOSIS — G20C Parkinsonism, unspecified: Secondary | ICD-10-CM | POA: Diagnosis not present

## 2023-12-24 DIAGNOSIS — L219 Seborrheic dermatitis, unspecified: Secondary | ICD-10-CM | POA: Diagnosis not present

## 2023-12-24 DIAGNOSIS — G8929 Other chronic pain: Secondary | ICD-10-CM | POA: Diagnosis not present

## 2023-12-24 DIAGNOSIS — F319 Bipolar disorder, unspecified: Secondary | ICD-10-CM | POA: Diagnosis not present

## 2023-12-24 DIAGNOSIS — M5416 Radiculopathy, lumbar region: Secondary | ICD-10-CM | POA: Diagnosis not present

## 2023-12-24 DIAGNOSIS — G2401 Drug induced subacute dyskinesia: Secondary | ICD-10-CM | POA: Diagnosis not present

## 2023-12-24 DIAGNOSIS — I10 Essential (primary) hypertension: Secondary | ICD-10-CM | POA: Diagnosis not present

## 2023-12-24 DIAGNOSIS — M542 Cervicalgia: Secondary | ICD-10-CM | POA: Diagnosis not present

## 2023-12-24 DIAGNOSIS — R4189 Other symptoms and signs involving cognitive functions and awareness: Secondary | ICD-10-CM | POA: Diagnosis not present

## 2023-12-24 NOTE — Telephone Encounter (Signed)
I just spoke with Amy with [facility] and she stated they had talked with the patient to [facility] for tonight but she didn't reschedule

## 2023-12-28 ENCOUNTER — Ambulatory Visit: Payer: Medicare PPO

## 2023-12-28 ENCOUNTER — Encounter: Payer: Medicare PPO | Admitting: Speech Pathology

## 2023-12-28 DIAGNOSIS — G2401 Drug induced subacute dyskinesia: Secondary | ICD-10-CM | POA: Diagnosis not present

## 2023-12-28 DIAGNOSIS — R4189 Other symptoms and signs involving cognitive functions and awareness: Secondary | ICD-10-CM | POA: Diagnosis not present

## 2023-12-28 DIAGNOSIS — G8929 Other chronic pain: Secondary | ICD-10-CM | POA: Diagnosis not present

## 2023-12-28 DIAGNOSIS — F319 Bipolar disorder, unspecified: Secondary | ICD-10-CM | POA: Diagnosis not present

## 2023-12-28 DIAGNOSIS — M542 Cervicalgia: Secondary | ICD-10-CM | POA: Diagnosis not present

## 2023-12-28 DIAGNOSIS — G20C Parkinsonism, unspecified: Secondary | ICD-10-CM | POA: Diagnosis not present

## 2023-12-28 DIAGNOSIS — I10 Essential (primary) hypertension: Secondary | ICD-10-CM | POA: Diagnosis not present

## 2023-12-28 DIAGNOSIS — L219 Seborrheic dermatitis, unspecified: Secondary | ICD-10-CM | POA: Diagnosis not present

## 2023-12-28 DIAGNOSIS — M5416 Radiculopathy, lumbar region: Secondary | ICD-10-CM | POA: Diagnosis not present

## 2023-12-30 ENCOUNTER — Encounter: Payer: Medicare PPO | Admitting: Speech Pathology

## 2023-12-30 NOTE — Progress Notes (Addendum)
 PROVIDER NOTE: Information contained herein reflects review and annotations entered in association with encounter. Interpretation of such information and data should be left to medically-trained personnel. Information provided to patient can be located elsewhere in the medical record under "Patient Instructions". Document created using STT-dictation technology, any transcriptional errors that may result from process are unintentional.    Patient: Kendra [name]  Service Category: E/M  Provider: Oswaldo Done, MD  DOB: Aug 16, 1946  DOS: 01/03/2024  Referring Provider: Erasmo Downer, MD  MRN: [id number]  Specialty: Interventional Pain Management  PCP: Erasmo Downer, MD  Type: Established Patient  Setting: Ambulatory outpatient    Location: Office  Delivery: Face-to-face     HPI  Kendra [name], a 78 y.o. year old female, is here today because of her Chronic bilateral low back pain without sciatica [M54.50, G89.29]. [name] primary complain today is Back Pain (Lumbar bilateral )  Pertinent problems: [name] has Malignant neoplasm of upper-outer quadrant of right breast in female, estrogen receptor positive (HCC); Malignant neoplasm of upper-inner quadrant of right breast in female, estrogen receptor positive (HCC); Mucinous cystadenoma; Parkinson disease (HCC); Osteopenia of neck of femur (Right); Chronic low back pain (1ry area of Pain) (Bilateral) (L>R) w/o sciatica; Chronic pain syndrome; Chronic hip pain (3ry area of Pain) (Bilateral) (L>R); Chronic lower extremity pain (2ry area of Pain) (Bilateral) (L>R); Abnormal MRI, lumbar spine (12/22/2023); Grade 1 Anterolisthesis of lumbar spine (L3/L4) & (5 mm) (L4/L5); Chronic low back pain of over 3 months duration; Osteoarthritis of  AC (acromioclavicular) joint (Left); Osteoarthritis of hip (Right); Impaction fracture of distal end of radius and ulna, sequela (Left); Osteoarthritis of 1st (thumb) carpometacarpal (CMC) joint of  hand (Left); Lumbosacral facet arthropathy (Multilevel) (Bilateral) (T12-S1); Lumbar central spinal stenosis w/o neurogenic claudication (L2-3, L3-4, L4-5); Lumbar foraminal stenosis (Bilateral: L2-3, L4-5) (Right: (Severe) L4-5); Lumbar lateral recess stenosis (Bilateral: L2-3, L3-4, and L4-5) (Severe); DDD (degenerative disc disease), lumbar; Lumbosacral facet syndrome (Multilevel) (Bilateral); Osteoarthritis of lumbar spine; Neurogenic bladder; Tardive dyskinesia; Restless leg syndrome; Lumbar facet joint pain; Spondylosis without myelopathy or radiculopathy, lumbosacral region; and History of breast cancer on their pertinent problem list. Pain Assessment: Severity of Chronic pain is reported as a 0-No pain/10. Location: Back Lower, Left, Right/denies. Onset: More than a month ago. Quality: Other (Comment) (no pain currently x 4 - 5 days). Timing: Intermittent. Modifying factor(s): procedure helped, heating pad, tylenol and gabapentin. Vitals:  height is 5\' 6"  (1.676 m) and weight is 230 lb (104.3 kg). Her temporal temperature is 98.1 F (36.7 C). Her blood pressure is 146/62 (abnormal) and her pulse is 73. Her respiration is 16 and oxygen saturation is 96%.  BMI: Estimated body mass index is 37.12 kg/m as calculated from the following:   Height as of this encounter: 5\' 6"  (1.676 m).   Weight as of this encounter: 230 lb (104.3 kg). Last encounter: 11/22/2023. Last procedure: 12/14/2023.  Reason for encounter: post-procedure evaluation and assessment.  Discussed the use of AI scribe software for clinical note transcription with the patient, who gave verbal consent to proceed.  History of Present Illness   Kendra [name] is a 78 year old female who presents for follow-up after a diagnostic bilateral lumbar facet block.  She returns for follow-up after undergoing a diagnostic bilateral lumbar facet block on December 14, 2023. The procedure provided 100% relief of her pain during the duration of the  local anesthetic and an ongoing 95% improvement in her low back and leg symptoms.  After the third day post-procedure, her condition improved significantly, and by ten days, her pain levels were well managed. She currently reports no back or leg pain, including the absence of previous pain in her right leg.  On December 06, 2023, she completed a lumbar MRI, which showed advanced and generalized lumbar spine degeneration with progression since 2021. The MRI revealed severe spinal stenosis from L1 to L3-4 and moderate stenosis at L4-5, with notable progression at L1, including end plate marrow edema and possible superior end plate fracture at L2. There is advanced foraminal impingement on the right at L4-5 and left at L1-2, with moderate foraminal narrowing on the left at L2-3 and L4-5.  No current back pain or leg pain.      Post-procedure evaluation   Type: Lumbar Facet, Medial Branch Block(s) (w/ fluoroscopic mapping) #1  Laterality: Bilateral  Level: L2, L3, L4, L5, and S1 Medial Branch Level(s). Injecting these levels blocks the L3-4, L4-5, and L5-S1 lumbar facet joints. Imaging: Fluoroscopic guidance Spinal ([id number]) Anesthesia: Local anesthesia (1-2% Lidocaine) Anxiolysis: IV Versed 2.0 mg Sedation: Moderate Sedation Fentanyl 1 mL (50 mcg) DOS: 12/14/2023 Performed by: Oswaldo Done, MD  Primary Purpose: Diagnostic/Therapeutic Indications: Low back pain severe enough to impact quality of life or function. 1. Chronic low back pain (1ry area of Pain) (Bilateral) (L>R) w/o sciatica   2. Grade 1 Anterolisthesis of lumbar spine (L3/L4) & (5 mm) (L4/L5)   3. Lumbar facet joint pain   4. Lumbosacral facet arthropathy (Multilevel) (Bilateral) (T12-S1)   5. Lumbosacral facet syndrome (Multilevel) (Bilateral)   6. Spondylosis without myelopathy or radiculopathy, lumbosacral region    NAS-11 Pain score:   Pre-procedure: 7 /10   Post-procedure: 0-No pain/10     Effectiveness:  Initial  hour after procedure: 100 %. Subsequent 4-6 hours post-procedure: 100 % (num until the next morning and then on day 3 the pain while sitting and activity were improving.). Analgesia past initial 6 hours: 95 %. Ongoing improvement:  Analgesic: The patient and her husband indicates that she has an ongoing 95% improvement of the pain.  However today she is currently enjoying 100% improvement since she denies any low back or lower extremity pain. Function: [name] reports improvement in function ROM: [name] reports improvement in ROM   Pharmacotherapy Assessment  Analgesic: No chronic opioid analgesics therapy prescribed by our practice. Tramadol 50 mg tablet, 1 tab p.o. twice daily MME/day: 10 mg/day   Monitoring: [place] PMP: PDMP reviewed during this encounter.       Pharmacotherapy: No side-effects or adverse reactions reported. Compliance: No problems identified. Effectiveness: Clinically acceptable.  Vernie Ammons, RN  01/03/2024  1:38 PM  Signed Safety precautions to be maintained throughout the outpatient stay will include: orient to surroundings, keep bed in low position, maintain call bell within reach at all times, provide assistance with transfer out of bed and ambulation.     No results found for: "CBDTHCR" No results found for: "D8THCCBX" No results found for: "D9THCCBX"  UDS:  Summary  Date Value Ref Range Status  12/22/2021 Note  Final    Comment:    ==================================================================== Compliance Drug Analysis, Ur ==================================================================== Test                             Result       Flag       Units  Drug Present and Declared for Prescription Verification   Tramadol                       >  5155        EXPECTED   ng/mg creat   O-Desmethyltramadol            1528         EXPECTED   ng/mg creat   N-Desmethyltramadol            4768         EXPECTED   ng/mg creat    Source of  tramadol is a prescription medication. O-desmethyltramadol    and N-desmethyltramadol are expected metabolites of tramadol.    Gabapentin                     PRESENT      EXPECTED   Fluoxetine                     PRESENT      EXPECTED   Norfluoxetine                  PRESENT      EXPECTED    Norfluoxetine is an expected metabolite of fluoxetine.    Trazodone                      PRESENT      EXPECTED   1,3 chlorophenyl piperazine    PRESENT      EXPECTED    1,3-chlorophenyl piperazine is an expected metabolite of trazodone.    Risperidone                    PRESENT      EXPECTED  Drug Absent but Declared for Prescription Verification   Acetaminophen                  Not Detected UNEXPECTED    Acetaminophen, as indicated in the declared medication list, is not    always detected even when used as directed.    Diclofenac                     Not Detected UNEXPECTED    Diclofenac, as indicated in the declared medication list, is not    always detected even when used as directed.  ==================================================================== Test                      Result    Flag   Units      Ref Range   Creatinine              97               mg/dL      >=13 ==================================================================== Declared Medications:  The flagging and interpretation on this report are based on the  following declared medications.  Unexpected results may arise from  inaccuracies in the declared medications.   **Note: The testing scope of this panel includes these medications:   Fluoxetine  Gabapentin (Neurontin)  Risperidone (Risperdal)  Tramadol (Ultram)  Trazodone (Desyrel)   **Note: The testing scope of this panel does not include small to  moderate amounts of these reported medications:   Acetaminophen  Diclofenac (Voltaren)   **Note: The testing scope of this panel does not include the  following reported medications:   Carbidopa (Sinemet)   Denosumab (Prolia)  Hydrochlorothiazide (Zestoretic)  Letrozole (Femara)  Levodopa (Sinemet)  Lisinopril (Zestoretic)  Meloxicam (Mobic)  Nystatin  Polyethylene Glycol  Vitamin B12 ==================================================================== For clinical consultation, please call [phone]. ====================================================================  ROS  Constitutional: Denies any fever or chills Gastrointestinal: No reported hemesis, hematochezia, vomiting, or acute GI distress Musculoskeletal: Denies any acute onset joint swelling, redness, loss of ROM, or weakness Neurological: No reported episodes of acute onset apraxia, aphasia, dysarthria, agnosia, amnesia, paralysis, loss of coordination, or loss of consciousness  Medication Review  AMBULATORY NON FORMULARY MEDICATION, Acetaminophen, FLUoxetine HCl, cyanocobalamin, denosumab, gabapentin, losartan-hydrochlorothiazide, nystatin, risperiDONE, and traZODone  History Review  Allergy: [name] is allergic to hydroxyzine, other, and shrimp [shellfish allergy]. Drug: [name]  reports no history of drug use. Alcohol:  reports that she does not currently use alcohol. Tobacco:  reports that she has never smoked. She has never used smokeless tobacco. Social: [name]  reports that she has never smoked. She has never used smokeless tobacco. She reports that she does not currently use alcohol. She reports that she does not use drugs. Medical:  has a past medical history of Anxiety, Breast cancer (HCC) (03/2018), Bursitis, Complication of anesthesia, Depression, Family history of breast cancer (03/24/2018), Family history of lung cancer (03/24/2018), History of alcohol dependence (HCC) (2007), History of hiatal hernia, History of psychosis (2014), Hypertension, Parkinson disease (HCC), Personal history of radiation therapy (06/2018-07/2018), and PONV (postoperative nausea and vomiting). Surgical: Ms.  Dershem  has a past surgical history that includes Ovarian cyst removal; Tonsillectomy and adenoidectomy (1953); Tonsillectomy; laparotomy (N/A, 02/10/2018); Salpingoophorectomy (Bilateral, 02/10/2018); Mass excision (01/2018); Breast lumpectomy with radioactive seed and sentinel lymph node biopsy (Right, 04/27/2018); Cataract extraction w/PHACO (Left, 08/30/2019); Cataract extraction w/PHACO (Right, 09/20/2019); Breast biopsy (Right, 03/14/2018); Breast biopsy (Right, 03/14/2018); and Breast lumpectomy (Right, 04/27/2018). Family: family history includes Breast cancer (age of onset: 37) in her cousin; Breast cancer (age of onset: 21) in her mother; Diabetes in her mother; Heart disease in her paternal grandfather; Heart disease (age of onset: 61) in her brother; Heart disease (age of onset: 24) in her paternal grandmother; Hypertension in her mother; Lung cancer in her father.  Laboratory Chemistry Profile   Renal Lab Results  Component Value Date   BUN 6 (L) 11/05/2023   CREATININE 0.72 11/05/2023   BCR 8 (L) 11/05/2023   GFRAA >60 04/29/2020   GFRNONAA >60 08/31/2023    Hepatic Lab Results  Component Value Date   AST 15 11/05/2023   ALT 22 11/05/2023   ALBUMIN 4.4 11/05/2023   ALKPHOS 66 11/05/2023   HCVAB <0.1 06/01/2019    Electrolytes Lab Results  Component Value Date   NA 134 11/05/2023   K 3.9 11/05/2023   CL 97 11/05/2023   CALCIUM 9.2 11/05/2023   MG 2.0 12/22/2021    Bone Lab Results  Component Value Date   VD25OH 15.6 (L) 11/06/2022   TESTOSTERONE 39 06/20/2015    Inflammation (CRP: Acute Phase) (ESR: Chronic Phase) Lab Results  Component Value Date   CRP 2 12/22/2021   ESRSEDRATE 20 12/22/2021         Note: Above Lab results reviewed.  Recent Imaging Review  MR LUMBAR SPINE WO CONTRAST CLINICAL DATA:  Low back pain, lumbar radiculopathy.  EXAM: MRI LUMBAR SPINE WITHOUT CONTRAST  TECHNIQUE: Multiplanar, multisequence MR imaging of the lumbar spine  was performed. No intravenous contrast was administered.  COMPARISON:  04/01/2020  FINDINGS: Segmentation:  Standard.  Alignment:  Mild degenerative anterolisthesis at L4-5, chronic  Vertebrae: Endplate edema at I6-9 best attributed to degeneration with T2 hypointense collapse of the disc and endplate degeneration eccentric to the left. There is some superimposed T2 hypointensity and endplate  collapse possible at L2 towards the left.  Conus medullaris and cauda equina: Conus extends to the T12-L1 level. Conus is normal. There is cauda equina redundancy from the degree of spinal stenosis.  Paraspinal and other soft tissues: No perispinal mass or inflammation.  Disc levels:  L1-L2: Disc collapse and endplate degeneration with circumferential disc bulging and central to left paracentral extrusion. Left paracentral there is upward migration compressing the descending left L1 nerve root. Spinal stenosis is severe. High-grade left foraminal impingement  L2-L3: Disc narrowing and bulging with facet spurring and ligamentum flavum thickening. Congenitally narrow lumbar spinal canal from short pedicles. Chronic and advanced spinal stenosis. Moderate left foraminal narrowing based on sagittal images primarily.  L3-L4: Bulky degenerative facet spurring with joint effusions and ligamentum flavum thickening. The disc is narrowed with severe spinal stenosis accentuated by short pedicles. 5 mm degenerative ganglion in the posterior midline of the ligamentum flavum with limited contribution to the spinal stenosis.  L4-L5: Bulky degenerative facet spurring with anterolisthesis. The disc is narrowed and bulging with moderate spinal stenosis. Asymmetric effacement of the right subarticular recess. Moderate left and advanced right foraminal impingement.  L5-S1:Mild facet spurring.  No neural compression.  IMPRESSION: 1. Advanced and generalized lumbar spine degeneration that  is progressed from 2021. 2. Degeneration and short pedicles causes severe spinal stenosis from L1-2 to L3-4 and moderate stenosis at L4-5. At L1-2 there has been notable progression since 2021 with endplate marrow edema and migrating disc extrusion. Cannot exclude a superior endplate fracture at L2. 3. Advanced foraminal impingement on the right at L4-5 and left at L1-2. Moderate foraminal narrowing on the left at L2-3 and L4-5. 4. Motion degraded.  Electronically Signed   By: Tiburcio Pea M.D.   On: 12/22/2023 08:02 MR CERVICAL SPINE WO CONTRAST CLINICAL DATA:  Tardive dyskinesia, unwitnessed fall January 2025. Cervicalgia.  EXAM: MRI CERVICAL SPINE WITHOUT CONTRAST  TECHNIQUE: Multiplanar, multisequence MR imaging of the cervical spine was performed. No intravenous contrast was administered.  COMPARISON:  None Available.  FINDINGS: Significantly limited study given the degree of motion which may be unavoidable  Alignment: Normal  Vertebrae: No fracture, evidence of discitis, or bone lesion.  Cord: Normal signal and morphology.  Posterior Fossa, vertebral arteries, paraspinal tissues: No perispinal mass or inflammation.  Disc levels:  The foramina are not adequately assessed throughout the cervical spine given the degree of motion. There is disc height loss, desiccation, and bulging throughout the cervical spine, each level affected to the largely the same degree. Also generalized facet spurring with moderate hypertrophy greater on the left diffusely. Gross patency of the spinal canal based on sagittal images.  IMPRESSION: Significantly limited study given the degree of motion artifact. There is generalized, ordinary degenerative change with gross patency of the spinal canal.  Electronically Signed   By: Tiburcio Pea M.D.   On: 12/22/2023 07:52 MR BRAIN WO CONTRAST CLINICAL DATA:  Tardive dyskinesia, unwitnessed fall January 2025.  EXAM: MRI HEAD  WITHOUT CONTRAST  TECHNIQUE: Multiplanar, multiecho pulse sequences of the brain and surrounding structures were obtained without intravenous contrast.  COMPARISON:  08/08/2018  FINDINGS: Brain: No acute or subacute infarction, hemorrhage, hydrocephalus, extra-axial collection or mass lesion. Less than typical chronic white matter disease for age. There is generalized volume loss with secondary enlargement of the lateral ventricles, progressed from 2019 comparison. No specific pattern of volume loss. Caudate volume loss has been described in the literature with tardive dyskinesia, but the change from 2019 appears more global  without disproportionate caudate volume loss. No change in mineralization of the basal ganglia.  Vascular: Flow voids are preserved  Skull and upper cervical spine: Normal marrow signal  Sinuses/Orbits: Bilateral cataract resection.  IMPRESSION: 1. Generalized volume loss since a 2019 comparison. 2. No reversible finding. No evidence of injury from report of recent trauma.  Electronically Signed   By: Tiburcio Pea M.D.   On: 12/22/2023 07:46 Note: Reviewed        Physical Exam  General appearance: Well nourished, well developed, and well hydrated. In no apparent acute distress Mental status: Alert, oriented x 3 (person, place, & time)       Respiratory: No evidence of acute respiratory distress Eyes: PERLA Vitals: BP (!) 146/62 (BP Location: Left Arm, Patient Position: Sitting, Cuff Size: Large)   Pulse 73   Temp 98.1 F (36.7 C) (Temporal)   Resp 16   Ht 5\' 6"  (1.676 m)   Wt 230 lb (104.3 kg)   SpO2 96%   BMI 37.12 kg/m  BMI: Estimated body mass index is 37.12 kg/m as calculated from the following:   Height as of this encounter: 5\' 6"  (1.676 m).   Weight as of this encounter: 230 lb (104.3 kg). Ideal: Ideal body weight: 59.3 kg (130 lb 11.7 oz) Adjusted ideal body weight: 77.3 kg (170 lb 7 oz)  Assessment   Diagnosis Status  1.  Chronic low back pain (1ry area of Pain) (Bilateral) (L>R) w/o sciatica   2. Lumbar facet joint pain   3. Lumbosacral facet syndrome (Multilevel) (Bilateral)   4. Grade 1 Anterolisthesis of lumbar spine (L3/L4) & (5 mm) (L4/L5)   5. Lumbar central spinal stenosis w/o neurogenic claudication (L2-3, L3-4, L4-5)   6. Lumbar foraminal stenosis (Bilateral: L2-3, L4-5) (Right: (Severe) L4-5)   7. Lumbar lateral recess stenosis (Bilateral: L2-3, L3-4, and L4-5) (Severe)   8. Lumbosacral facet arthropathy (Multilevel) (Bilateral) (T12-S1)   9. Abnormal MRI, lumbar spine (12/22/2023)   10. Postop check    Resolved Resolved Resolved   Updated Problems: Problem  Abnormal MRI, lumbar spine (12/22/2023)   (12/22/2023) LUMBAR MRI IMPRESSION: 1. Advanced and generalized lumbar spine degeneration that is progressed from 2021. 2. Degeneration and short pedicles causes severe spinal stenosis from L1-2 to L3-4 and moderate stenosis at L4-5. At L1-2 there has been notable progression since 2021 with endplate marrow edema and migrating disc extrusion. Cannot exclude a superior endplate fracture at L2. 3. Advanced foraminal impingement on the right at L4-5 and left at L1-2. Moderate foraminal narrowing on the left at L2-3 and L4-5. 4. Motion degraded.  Electronically Signed   By: Tiburcio Pea M.D.   On: 12/22/2023 08:02  _________________________________________________________________  (01/31/2020) LUMBAR MRI FINDINGS: Alignment:  5 mm grade 1 anterolisthesis L4 on L5. Vertebrae: Scattered small intraosseous hemangiomas, most notable within the L2 and L4 vertebral bodies. Conus medullaris and cauda equina: There is bunching of the cauda equina nerve roots above the L2-3 level of stenosis.  DISC LEVELS: T12-L1: Mild diffuse disc bulge. Mild right greater than left facet arthrosis. L1-2: Mild diffuse disc bulge and mild bilateral facet arthrosis. L2-3: Diffuse disc bulge with advanced bilateral facet  arthrosis and ligamentum flavum buckling resulting in severe canal stenosis, severe bilateral subarticular recess stenosis. Moderate left and mild-to-moderate right foraminal stenosis.  L3-4: Diffuse disc bulge with advanced bilateral facet arthrosis and ligamentum flavum buckling resulting in severe canal stenosis and moderate to severe bilateral subarticular recess stenosis. L4-5: Disc uncovering with advanced right  worse than left facet arthrosis resulting in mild to moderate canal stenosis and moderate to severe right foraminal stenosis and mild-to-moderate left foraminal stenosis. Severe bilateral subarticular recess stenosis.  L5-S1: Unremarkable disc. Bilateral facet arthrosis. No foraminal or canal stenosis.  IMPRESSION: 1. Advanced multilevel degenerative changes of the lumbar spine as described above, with severe L2-3 and L3-4 canal stenosis. 2. Multilevel bilateral foraminal stenosis, moderate-to-severe on the right at L4-5 and moderate on the left at L2-3.     Plan of Care  Problem-specific:  Assessment and Plan    Lumbar Spine Degeneration with Spinal Stenosis Miss Kendra [name] has advanced lumbar spine degeneration with severe spinal stenosis from L1 to L3-4 and moderate stenosis at L4-5. MRI indicates progression since 2021, with end plate marrow edema and a possible superior end plate fracture at L2. There is advanced foraminal impingement on the right at L4-5 and left at L1-2, with moderate foraminal narrowing on the left at L2-3 and L4-5. She reports 95% improvement in low back and leg pain following a diagnostic bilateral lumbar facet block and is currently asymptomatic. Discussed the benefits of neurosurgical evaluation while asymptomatic to consider surgical options. If pain recurs, treatment will be guided by pain type: lumbar facet block for pain from outside spinal structures, or epidural injections if leg pain predominates. Refer to neurosurgery for evaluation of spinal stenosis  and potential surgical options. Repeat lumbar facet block if pain recurs from outside spinal structures. Consider epidural injections if leg pain predominates upon recurrence. Call if pain recurs and manage referral to neurosurgery.      [name] has a current medication list which includes the following long-term medication(s): fluoxetine hcl, gabapentin, losartan-hydrochlorothiazide, risperidone, and trazodone.  Pharmacotherapy (Medications Ordered): No orders of the defined types were placed in this encounter.  Orders:  Orders Placed This Encounter  Procedures   Ambulatory referral to Neurosurgery    Referral Priority:   Routine    Referral Type:   Surgical    Referral Reason:   Specialty Services Required    Referred to Provider:   Lovenia Kim, MD    Requested Specialty:   Neurosurgery    Number of Visits Requested:   1   Nursing Instructions:    Please complete this patient's postprocedure evaluation.    Scheduling Instructions:     Please complete this patient's postprocedure evaluation.   Follow-up plan:   Return if symptoms worsen or fail to improve.      Interventional Therapies  Risk  Complexity Considerations:   Allergy to shellfish    Planned  Pending:   Diagnostic bilateral lumbar facet MBB #1 (12/14/2023)    Under consideration:   Diagnostic left L2-3 LESI #3  Diagnostic midline L3-4 LESI #1  Diagnostic bilateral lumbar facet MBB #1    Completed:   Diagnostic/diagnostic left L2-3 LESI x2 (01/12/2023) (1st:100/100/75/75) (2nd: 25/25/50/50)    Completed by other providers:   (06/27/2021) bilateral L5-S1 transforaminal ESI (75% relief) by Merri Ray, DO (08/14/2021) left L5-S1 transforaminal ESI + left S1 TFESI by Merri Ray, DO   Therapeutic  Palliative (PRN) options:   None established     Recent Visits Date Type Provider Dept  12/14/23 Procedure visit Delano Metz, MD [facility]  11/22/23 Office Visit  Delano Metz, MD [facility]  Showing recent visits within past 90 days and meeting all other requirements Today's Visits Date Type Provider Dept  01/03/24 Office Visit Delano Metz, MD [facility]  Showing today's  visits and meeting all other requirements Future Appointments No visits were found meeting these conditions. Showing future appointments within next 90 days and meeting all other requirements  I discussed the assessment and treatment plan with the patient. The patient was provided an opportunity to ask questions and all were answered. The patient agreed with the plan and demonstrated an understanding of the instructions.  Patient advised to call back or seek an in-person evaluation if the symptoms or condition worsens.  Duration of encounter: 30 minutes.  Total time on encounter, as per AMA guidelines included both the face-to-face and non-face-to-face time personally spent by the physician and/or other qualified health care professional(s) on the day of the encounter (includes time in activities that require the physician or other qualified health care professional and does not include time in activities normally performed by clinical staff). Physician's time may include the following activities when performed: Preparing to see the patient (e.g., pre-charting review of records, searching for previously ordered imaging, lab work, and nerve conduction tests) Review of prior analgesic pharmacotherapies. Reviewing PMP Interpreting ordered tests (e.g., lab work, imaging, nerve conduction tests) Performing post-procedure evaluations, including interpretation of diagnostic procedures Obtaining and/or reviewing separately obtained history Performing a medically appropriate examination and/or evaluation Counseling and educating the patient/family/caregiver Ordering medications, tests, or procedures Referring and communicating with other health care  professionals (when not separately reported) Documenting clinical information in the electronic or other health record Independently interpreting results (not separately reported) and communicating results to the patient/ family/caregiver Care coordination (not separately reported)  Note by: Oswaldo Done, MD Date: 01/03/2024; Time: 1:40 PM

## 2023-12-31 DIAGNOSIS — M5416 Radiculopathy, lumbar region: Secondary | ICD-10-CM | POA: Diagnosis not present

## 2023-12-31 DIAGNOSIS — R4189 Other symptoms and signs involving cognitive functions and awareness: Secondary | ICD-10-CM | POA: Diagnosis not present

## 2023-12-31 DIAGNOSIS — G20C Parkinsonism, unspecified: Secondary | ICD-10-CM | POA: Diagnosis not present

## 2023-12-31 DIAGNOSIS — G8929 Other chronic pain: Secondary | ICD-10-CM | POA: Diagnosis not present

## 2023-12-31 DIAGNOSIS — F319 Bipolar disorder, unspecified: Secondary | ICD-10-CM | POA: Diagnosis not present

## 2023-12-31 DIAGNOSIS — I10 Essential (primary) hypertension: Secondary | ICD-10-CM | POA: Diagnosis not present

## 2023-12-31 DIAGNOSIS — G2401 Drug induced subacute dyskinesia: Secondary | ICD-10-CM | POA: Diagnosis not present

## 2023-12-31 DIAGNOSIS — L219 Seborrheic dermatitis, unspecified: Secondary | ICD-10-CM | POA: Diagnosis not present

## 2023-12-31 DIAGNOSIS — M542 Cervicalgia: Secondary | ICD-10-CM | POA: Diagnosis not present

## 2024-01-03 ENCOUNTER — Ambulatory Visit: Payer: Medicare PPO | Attending: Pain Medicine | Admitting: Pain Medicine

## 2024-01-03 ENCOUNTER — Encounter: Payer: Self-pay | Admitting: Pain Medicine

## 2024-01-03 VITALS — BP 146/62 | HR 73 | Temp 98.1°F | Resp 16 | Ht 66.0 in | Wt 230.0 lb

## 2024-01-03 DIAGNOSIS — M5459 Other low back pain: Secondary | ICD-10-CM | POA: Diagnosis not present

## 2024-01-03 DIAGNOSIS — M48061 Spinal stenosis, lumbar region without neurogenic claudication: Secondary | ICD-10-CM | POA: Insufficient documentation

## 2024-01-03 DIAGNOSIS — M4316 Spondylolisthesis, lumbar region: Secondary | ICD-10-CM | POA: Diagnosis not present

## 2024-01-03 DIAGNOSIS — M47817 Spondylosis without myelopathy or radiculopathy, lumbosacral region: Secondary | ICD-10-CM | POA: Insufficient documentation

## 2024-01-03 DIAGNOSIS — M545 Low back pain, unspecified: Secondary | ICD-10-CM | POA: Insufficient documentation

## 2024-01-03 DIAGNOSIS — G8929 Other chronic pain: Secondary | ICD-10-CM | POA: Diagnosis present

## 2024-01-03 DIAGNOSIS — R937 Abnormal findings on diagnostic imaging of other parts of musculoskeletal system: Secondary | ICD-10-CM | POA: Diagnosis present

## 2024-01-03 DIAGNOSIS — Z09 Encounter for follow-up examination after completed treatment for conditions other than malignant neoplasm: Secondary | ICD-10-CM | POA: Insufficient documentation

## 2024-01-03 NOTE — Progress Notes (Signed)
 Safety precautions to be maintained throughout the outpatient stay will include: orient to surroundings, keep bed in low position, maintain call bell within reach at all times, provide assistance with transfer out of bed and ambulation.

## 2024-01-03 NOTE — Patient Instructions (Signed)

## 2024-01-04 ENCOUNTER — Encounter: Payer: Medicare PPO | Admitting: Speech Pathology

## 2024-01-04 ENCOUNTER — Ambulatory Visit: Payer: Medicare PPO

## 2024-01-04 DIAGNOSIS — G2401 Drug induced subacute dyskinesia: Secondary | ICD-10-CM | POA: Diagnosis not present

## 2024-01-04 DIAGNOSIS — G8929 Other chronic pain: Secondary | ICD-10-CM | POA: Diagnosis not present

## 2024-01-04 DIAGNOSIS — R4189 Other symptoms and signs involving cognitive functions and awareness: Secondary | ICD-10-CM | POA: Diagnosis not present

## 2024-01-04 DIAGNOSIS — F319 Bipolar disorder, unspecified: Secondary | ICD-10-CM | POA: Diagnosis not present

## 2024-01-04 DIAGNOSIS — I10 Essential (primary) hypertension: Secondary | ICD-10-CM | POA: Diagnosis not present

## 2024-01-04 DIAGNOSIS — L219 Seborrheic dermatitis, unspecified: Secondary | ICD-10-CM | POA: Diagnosis not present

## 2024-01-04 DIAGNOSIS — G20C Parkinsonism, unspecified: Secondary | ICD-10-CM | POA: Diagnosis not present

## 2024-01-04 DIAGNOSIS — M542 Cervicalgia: Secondary | ICD-10-CM | POA: Diagnosis not present

## 2024-01-04 DIAGNOSIS — M5416 Radiculopathy, lumbar region: Secondary | ICD-10-CM | POA: Diagnosis not present

## 2024-01-05 NOTE — Telephone Encounter (Signed)
We received a note from [facility] stating the patient has refused to schedule sleep study

## 2024-01-05 NOTE — Telephone Encounter (Signed)
 Thanks

## 2024-01-06 ENCOUNTER — Encounter: Payer: Medicare PPO | Admitting: Speech Pathology

## 2024-01-06 DIAGNOSIS — I10 Essential (primary) hypertension: Secondary | ICD-10-CM | POA: Diagnosis not present

## 2024-01-06 DIAGNOSIS — M5416 Radiculopathy, lumbar region: Secondary | ICD-10-CM | POA: Diagnosis not present

## 2024-01-06 DIAGNOSIS — L219 Seborrheic dermatitis, unspecified: Secondary | ICD-10-CM | POA: Diagnosis not present

## 2024-01-06 DIAGNOSIS — G20C Parkinsonism, unspecified: Secondary | ICD-10-CM | POA: Diagnosis not present

## 2024-01-06 DIAGNOSIS — R4189 Other symptoms and signs involving cognitive functions and awareness: Secondary | ICD-10-CM | POA: Diagnosis not present

## 2024-01-06 DIAGNOSIS — M542 Cervicalgia: Secondary | ICD-10-CM | POA: Diagnosis not present

## 2024-01-06 DIAGNOSIS — F319 Bipolar disorder, unspecified: Secondary | ICD-10-CM | POA: Diagnosis not present

## 2024-01-06 DIAGNOSIS — G8929 Other chronic pain: Secondary | ICD-10-CM | POA: Diagnosis not present

## 2024-01-06 DIAGNOSIS — G2401 Drug induced subacute dyskinesia: Secondary | ICD-10-CM | POA: Diagnosis not present

## 2024-01-07 NOTE — Progress Notes (Signed)
 Referring Physician:  Delano Metz, MD [address] [place],  [place] [address]  Primary Physician:  Erasmo Downer, MD  History of Present Illness: 01/10/2024 [name] is here today with a chief complaint of history of back pain and right lower extremity pain.  She has been working with physical therapy as well as Dr. Laban Emperor.  Thankfully she has had a almost complete resolution of her back pain and radiating right lower extremity pain.  She is here today to establish care.  We did discuss questions concerning possible lumbar claudication.  She does state that she is unable to ambulate more than 100 feet at a time.  She is not able to walk a single street on a block.  She is currently working with home health.  Her outpatient physical therapist recommended a 2 wheeled walker, she is being evaluated by her home health physical therapist currently and have not yet gotten recommendations on walking devices.  She is also going to the movement team over at [facility], has also established with Dr. Margaretmary Eddy team at the [place] [facility].  Conservative measures:  Physical therapy: has participated in at [facility] and was discharged on 11/23/23.  Currently working with home health physical therapy Multimodal medical therapy including regular antiinflammatories: tylenol, gabapentin Injections:  12/14/2023 - Lumbar facet block bilaterally (Level: L2, L3, L4, L5, S1, and TBD Medial Branch Nerves) 01/12/23: Left L2-3 IL ESI (Dr. Laban Emperor) 02/24/22: Left L2-3 IL ESI (Dr. Laban Emperor) 08/14/2021: Left L5-S1 and left S1 transforaminal ESI (Dr. Yves Dill) 06/27/2021: Bilateral L5-S1 transforaminal ESI (75% relief-Dr. Yves Dill)  Past Surgery: no spinal surgeries  The symptoms are causing a significant impact on the patient's life.   I have utilized the care everywhere function in epic to review the outside records available from external health systems.  Review of Systems:  A 10 point  review of systems is negative, except for the pertinent positives and negatives detailed in the HPI.  Past Medical History: Past Medical History:  Diagnosis Date   Anxiety    Breast cancer (HCC) 03/2018   right breast cancer at 11:00 and 1:00   Bursitis    bilateral hips and knees   Complication of anesthesia    Depression    Family history of breast cancer 03/24/2018   Family history of lung cancer 03/24/2018   History of alcohol dependence (HCC) 2007   no ETOH; resolved since 2007   History of hiatal hernia    History of psychosis 2014   due to sleep disturbance   Hypertension    Parkinson disease (HCC)    Personal history of radiation therapy 06/2018-07/2018   right breast ca   PONV (postoperative nausea and vomiting)     Past Surgical History: Past Surgical History:  Procedure Laterality Date   BREAST BIOPSY Right 03/14/2018   11:00 DCIS and invasive ductal carcinoma   BREAST BIOPSY Right 03/14/2018   1:00 Invasive ductal carcinoma   BREAST LUMPECTOMY Right 04/27/2018   lumpectomy of 11 and 1:00 cancers, clear margins, negative LN   BREAST LUMPECTOMY WITH RADIOACTIVE SEED AND SENTINEL LYMPH NODE BIOPSY Right 04/27/2018   Procedure: RIGHT BREAST LUMPECTOMY WITH RADIOACTIVE SEED X 2 AND RIGHT SENTINEL LYMPH NODE BIOPSY;  Surgeon: Glenna Fellows, MD;  Location: [facility];  Service: General;  Laterality: Right;   CATARACT EXTRACTION W/PHACO Left 08/30/2019   Procedure: CATARACT EXTRACTION PHACO AND INTRAOCULAR LENS PLACEMENT (IOC) LEFT panoptix toric  01:20.5  12.7%  10.26;  Surgeon: Lockie Mola, MD;  Location: [facility];  Service: Ophthalmology;  Laterality: Left;   CATARACT EXTRACTION W/PHACO Right 09/20/2019   Procedure: CATARACT EXTRACTION PHACO AND INTRAOCULAR LENS PLACEMENT (IOC) RIGHT 5.71  00:36.4  15.7%;  Surgeon: Lockie Mola, MD;  Location: [facility];  Service: Ophthalmology;  Laterality: Right;   LAPAROTOMY N/A  02/10/2018   Procedure: EXPLORATORY LAPAROTOMY;  Surgeon: Shonna Chock, MD;  Location: [facility];  Service: Gynecology;  Laterality: N/A;   MASS EXCISION  01/2018   abdominal   OVARIAN CYST REMOVAL     SALPINGOOPHORECTOMY Bilateral 02/10/2018   Procedure: BILATERAL SALPINGO OOPHORECTOMY; PERITONEAL WASHINGS;  Surgeon: Shonna Chock, MD;  Location: [facility];  Service: Gynecology;  Laterality: Bilateral;   TONSILLECTOMY     TONSILLECTOMY AND ADENOIDECTOMY  1953    Allergies: Allergies as of 01/10/2024 - Review Complete 01/10/2024  Allergen Reaction Noted   Hydroxyzine Anaphylaxis 02/07/2018   Other Other (See Comments) 02/07/2018   Shrimp [shellfish allergy] Other (See Comments) 02/07/2018    Medications:  Current Outpatient Medications:    Acetaminophen 500 MG capsule, Take 1,000 mg by mouth 2 (two) times daily., Disp: , Rfl:    AMBULATORY NON FORMULARY MEDICATION, 1 Bag by Other route daily. HERO smart dispenser, Disp: 1 Bag, Rfl: 0   cyanocobalamin (VITAMIN B12) 1000 MCG/ML injection, INJECT 1 ML INTO THE MUSCLE EVERY 30 DAYS, Disp: 3 mL, Rfl: 3   denosumab (PROLIA) 60 MG/ML SOSY injection, Inject 60 mg into the skin every 6 (six) months. takes annually, Disp: , Rfl:    FLUoxetine HCl 60 MG TABS, Take 1 tablet by mouth daily., Disp: 90 tablet, Rfl: 3   gabapentin (NEURONTIN) 300 MG capsule, Take 1 capsule (300 mg total) by mouth 3 (three) times daily., Disp: 90 capsule, Rfl: 1   losartan-hydrochlorothiazide (HYZAAR) 50-12.5 MG tablet, Take 1 tablet by mouth daily., Disp: 90 tablet, Rfl: 1   nystatin (MYCOSTATIN/NYSTOP) powder, APPLY TO AFFECTED AREA THREE TIMES A DAY, Disp: 60 g, Rfl: 0   risperiDONE (RISPERDAL) 0.5 MG tablet, TAKE 1 TABLET BY MOUTH TWICE A DAY, Disp: 180 tablet, Rfl: 1   traZODone (DESYREL) 100 MG tablet, Take 1 tablet (100 mg total) by mouth at bedtime., Disp: 90 tablet, Rfl: 3  Social History: Social History   Tobacco Use   Smoking status: Never   Smokeless  tobacco: Never  Vaping Use   Vaping status: Never Used  Substance Use Topics   Alcohol use: Not Currently    Comment: alcohol dependence prior to 2007   Drug use: Never    Family Medical History: Family History  Problem Relation Age of Onset   Diabetes Mother    Breast cancer Mother 79   Hypertension Mother    Lung cancer Father        asbestos exposure   Heart disease Brother 68   Breast cancer Cousin 78       paternal cousin   Heart disease Paternal Grandmother 39   Heart disease Paternal Grandfather     Physical Examination: Vitals:   01/10/24 1019  BP: 128/72    General: Patient is in no apparent distress. Attention to examination is appropriate.  Neck:   Supple.  Full range of motion.  Respiratory: Patient is breathing without any difficulty.   NEUROLOGICAL:     Awake, alert, oriented to person, place, and time.  Speech is clear and fluent.   Cranial Nerves: Pupils equal round and reactive to  light.  Facial tone is symmetric.  Facial sensation is symmetric. Shoulder shrug is symmetric. Tongue protrusion is midline.    Strength:  Side Iliopsoas Quads Hamstring PF DF EHL  R 5 5 5  4+ 4+ 4+  L 5 5 5  4+ 4+ 3   Reflexes are decreased at the bilateral ankles, 2+ at the bilateral knees  Grossly intact to sensation testing  Gait is antalgic with a significant forward lean.   Imaging: Narrative & Impression  CLINICAL DATA:  Low back pain, lumbar radiculopathy.   EXAM: MRI LUMBAR SPINE WITHOUT CONTRAST   TECHNIQUE: Multiplanar, multisequence MR imaging of the lumbar spine was performed. No intravenous contrast was administered.   COMPARISON:  04/01/2020   FINDINGS: Segmentation:  Standard.   Alignment:  Mild degenerative anterolisthesis at L4-5, chronic   Vertebrae: Endplate edema at A5-4 best attributed to degeneration with T2 hypointense collapse of the disc and endplate degeneration eccentric to the left. There is some superimposed T2  hypointensity and endplate collapse possible at L2 towards the left.   Conus medullaris and cauda equina: Conus extends to the T12-L1 level. Conus is normal. There is cauda equina redundancy from the degree of spinal stenosis.   Paraspinal and other soft tissues: No perispinal mass or inflammation.   Disc levels:   L1-L2: Disc collapse and endplate degeneration with circumferential disc bulging and central to left paracentral extrusion. Left paracentral there is upward migration compressing the descending left L1 nerve root. Spinal stenosis is severe. High-grade left foraminal impingement   L2-L3: Disc narrowing and bulging with facet spurring and ligamentum flavum thickening. Congenitally narrow lumbar spinal canal from short pedicles. Chronic and advanced spinal stenosis. Moderate left foraminal narrowing based on sagittal images primarily.   L3-L4: Bulky degenerative facet spurring with joint effusions and ligamentum flavum thickening. The disc is narrowed with severe spinal stenosis accentuated by short pedicles. 5 mm degenerative ganglion in the posterior midline of the ligamentum flavum with limited contribution to the spinal stenosis.   L4-L5: Bulky degenerative facet spurring with anterolisthesis. The disc is narrowed and bulging with moderate spinal stenosis. Asymmetric effacement of the right subarticular recess. Moderate left and advanced right foraminal impingement.   L5-S1:Mild facet spurring.  No neural compression.   IMPRESSION: 1. Advanced and generalized lumbar spine degeneration that is progressed from 2021. 2. Degeneration and short pedicles causes severe spinal stenosis from L1-2 to L3-4 and moderate stenosis at L4-5. At L1-2 there has been notable progression since 2021 with endplate marrow edema and migrating disc extrusion. Cannot exclude a superior endplate fracture at L2. 3. Advanced foraminal impingement on the right at L4-5 and left at L1-2.  Moderate foraminal narrowing on the left at L2-3 and L4-5. 4. Motion degraded.     Electronically Signed   By: Tiburcio Pea M.D.   On: 12/22/2023 08:02   Narrative & Impression  CLINICAL DATA:  Chronic low back pain.  Left hip pain.   EXAM: LUMBAR SPINE - COMPLETE WITH BENDING VIEWS   COMPARISON:  Lumbar spine 12/22/2021.   FINDINGS: Lumbar spine numbered with the lowest segmented appearing lumbar shaped vertebrae on lateral view as L5. Lumbar spine scoliosis concave right. Diffuse multilevel disc degeneration with endplate osteophyte formation. Disc degeneration most prominent at L2-L3 and L5-S1. 5 mm anterolisthesis L4 on L5. Diffuse severe facet hypertrophy. No flexion or extension abnormality identified. No evidence of fracture. Sclerotic densities are noted in the L2 and L4 vertebral bodies. Although these could represent bone islands blastic metastatic  disease cannot be excluded and MRI of the lumbar spine suggested for further evaluation. Large amount of stool noted throughout the colon. Aortoiliac atherosclerotic vascular calcification.   IMPRESSION: 1. Lumbar spine scoliosis concave right. Diffuse multilevel degenerative change. Disc degeneration most prominent at L2-L3 and L5-S1. 5 mm anterolisthesis L4 on L5. Diffuse severe facet hypertrophy. No flexion or extension abnormality identified. No evidence of fracture. 2. Sclerotic densities are noted in the L2 and L4 vertebral bodies. Although these could represent bone islands blastic metastatic disease cannot be excluded. MRI of the lumbar spine suggested for further evaluation. 3. Large amount of stool noted throughout the colon. 4. Aortoiliac atherosclerotic vascular disease.     Electronically Signed   By: Maisie Fus  Register M.D.   On: 01/05/2023 08:55      I have personally reviewed the images and agree with the above interpretation.  Outside Record Review: Shireen Quan 01/03/24 "Miss Ryle has advanced  lumbar spine degeneration with severe spinal stenosis from L1 to L3-4 and moderate stenosis at L4-5. MRI indicates progression since 2021, with end plate marrow edema and a possible superior end plate fracture at L2. There is advanced foraminal impingement on the right at L4-5 and left at L1-2, with moderate foraminal narrowing on the left at L2-3 and L4-5. She reports 95% improvement in low back and leg pain following a diagnostic bilateral lumbar facet block and is currently asymptomatic. Discussed the benefits of neurosurgical evaluation while symptomatic to consider surgical options. If pain recurs, treatment will be guided by pain type: lumbar facet block for pain from outside spinal structures, or epidural injections if leg pain predominates. Refer to neurosurgery for evaluation of spinal stenosis and potential surgical options. Repeat lumbar facet block if pain recurs from outside spinal structures. Consider epidural injections if leg pain predominates upon recurrence. Call if pain recurs and manage referral to neurosurgery. "  Medical Decision Making/Assessment and Plan: Ms. Hornbaker is a pleasant 78 y.o. female with history of chronic back pain and right lumbar radiculopathy.  She has been managed by our pain team and has had recent facet injections with Dr. Laban Emperor which caused a complete improvement in her back pain and right lower extremity pain.  Overall she is very pleased with this.  She does have symptoms that would be consistent with lumbar claudication with decreased ambulatory ability, states that she is currently may be able to walk 100 feet at a time without having to rest and sit down.  She often leans forward while she walks.  She prefers to use a chair on any longer excursions.  She is working with physical therapy.  Was recommended for a 2 wheeled walker previously, but is working with home health and is getting a new evaluation recently.  They have not gotten updates on these results  yet.  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  On her physical examination she does have some distal weakness with decreased reflexes in her ankles compared to her knees.  She has facet arthropathy as well which put her at risk for the synovial cyst.  She also has some degenerative spondylolisthesis causing a slight slip.  However given her lack of current back pain she is currently well-controlled.  I would like her to be continued evaluation with physical therapy as her ambulation has decreased over time.  Feel like her  symptoms could be's consistent with lumbar claudication given the severe stenosis noted on her MRI.  She is currently able to walk per report approximately 100 feet before having to sit down and rest.  However she is deconditioned from her recent exacerbation.  She is also being evaluated for some more systemic neurologic issues, we will plan to have her evaluated.  We will plan on holding off on any surgical planning until after she has had her neurologic evaluation and after we have seen how she does with increased ambulation requirements.   Thank you for involving me in the care of this patient.    Lovenia Kim MD/MSCR Neurosurgery   Decision making moderate, 2 chronic illnesses, outside review of 2 external sources, independent review of MRI

## 2024-01-08 ENCOUNTER — Other Ambulatory Visit: Payer: Self-pay | Admitting: Family Medicine

## 2024-01-09 DIAGNOSIS — M5416 Radiculopathy, lumbar region: Secondary | ICD-10-CM | POA: Diagnosis not present

## 2024-01-09 DIAGNOSIS — I10 Essential (primary) hypertension: Secondary | ICD-10-CM | POA: Diagnosis not present

## 2024-01-09 DIAGNOSIS — G2401 Drug induced subacute dyskinesia: Secondary | ICD-10-CM | POA: Diagnosis not present

## 2024-01-09 DIAGNOSIS — M542 Cervicalgia: Secondary | ICD-10-CM | POA: Diagnosis not present

## 2024-01-09 DIAGNOSIS — G20C Parkinsonism, unspecified: Secondary | ICD-10-CM | POA: Diagnosis not present

## 2024-01-09 DIAGNOSIS — F319 Bipolar disorder, unspecified: Secondary | ICD-10-CM | POA: Diagnosis not present

## 2024-01-09 DIAGNOSIS — G8929 Other chronic pain: Secondary | ICD-10-CM | POA: Diagnosis not present

## 2024-01-09 DIAGNOSIS — L219 Seborrheic dermatitis, unspecified: Secondary | ICD-10-CM | POA: Diagnosis not present

## 2024-01-09 DIAGNOSIS — R4189 Other symptoms and signs involving cognitive functions and awareness: Secondary | ICD-10-CM | POA: Diagnosis not present

## 2024-01-10 ENCOUNTER — Ambulatory Visit (INDEPENDENT_AMBULATORY_CARE_PROVIDER_SITE_OTHER): Admitting: Neurosurgery

## 2024-01-10 VITALS — BP 128/72 | Ht 66.0 in | Wt 230.0 lb

## 2024-01-10 DIAGNOSIS — M5416 Radiculopathy, lumbar region: Secondary | ICD-10-CM

## 2024-01-10 DIAGNOSIS — M545 Low back pain, unspecified: Secondary | ICD-10-CM | POA: Diagnosis not present

## 2024-01-10 DIAGNOSIS — M48061 Spinal stenosis, lumbar region without neurogenic claudication: Secondary | ICD-10-CM

## 2024-01-10 DIAGNOSIS — G8929 Other chronic pain: Secondary | ICD-10-CM

## 2024-01-10 DIAGNOSIS — M4316 Spondylolisthesis, lumbar region: Secondary | ICD-10-CM | POA: Diagnosis not present

## 2024-01-10 DIAGNOSIS — M5459 Other low back pain: Secondary | ICD-10-CM

## 2024-01-10 NOTE — Telephone Encounter (Signed)
 NFN

## 2024-01-10 NOTE — Telephone Encounter (Signed)
 Requested Prescriptions  Pending Prescriptions Disp Refills   gabapentin (NEURONTIN) 300 MG capsule [Pharmacy Med Name: GABAPENTIN 300 MG CAPSULE] 90 capsule 1    Sig: TAKE 1 CAPSULE BY MOUTH 3 TIMES A DAY     Neurology: Anticonvulsants - gabapentin Passed - 01/10/2024  1:23 PM      Passed - Cr in normal range and within 360 days    Creatinine  Date Value Ref Range Status  08/31/2023 0.70 0.44 - 1.00 mg/dL Final   Creatinine, Ser  Date Value Ref Range Status  11/05/2023 0.72 0.57 - 1.00 mg/dL Final         Passed - Completed PHQ-2 or PHQ-9 in the last 360 days      Passed - Valid encounter within last 12 months    Recent Outpatient Visits           1 month ago Chronic cough   [place] [facility] [place], Marzella Schlein, MD   2 months ago Essential hypertension   [place] [facility] [place], Marzella Schlein, MD   8 months ago Essential hypertension   [facility] [place], Marzella Schlein, MD   11 months ago Encounter for annual wellness visit (AWV) in Medicare patient   [facility] [place], Marzella Schlein, MD   1 year ago Frequent urination   [place] [facility] [place], Marzella Schlein, MD

## 2024-01-10 NOTE — Patient Instructions (Addendum)
 It was a pleasure seeing you today at [facility] neurosurgery in [place].  I am pleased that your back pain has improved with injections with Dr. Laban Emperor, and that your right lower extremity pain has improved as well.  We discussed your imaging today which shows stenosis or tightness in your lumbar spine this is secondary to a cyst at 1 level and the herniated disc at another.  Symptoms of this are often heaviness in your legs when you are trying to walk.  Because you have had limited ambulation recently I would like you to work over the next 6 weeks to take daily walks and to chart your progress.  I would like you to continue working with your physical therapist.  Will see you back in 6 weeks and we will discuss the performance on your walking.  Please note whether or not it gets better if you lean forward or bend to help you get more ambulation capacity.  Also discussed with your physical therapist whether or not they think you would benefit from a walking device.  At 6 weeks we will see you back and we will discuss these outcomes, and will also discuss your upcoming visit with your neurologist for movement disorder clinic or at [facility].  Once you are able to get contact information for your physical therapist please forward that to [place] and we will be happy to send them our notes and recommendations.

## 2024-01-11 ENCOUNTER — Encounter: Payer: Medicare PPO | Admitting: Speech Pathology

## 2024-01-11 ENCOUNTER — Ambulatory Visit: Payer: Medicare PPO

## 2024-01-11 ENCOUNTER — Ambulatory Visit: Payer: Medicare PPO | Admitting: Nurse Practitioner

## 2024-01-11 ENCOUNTER — Telehealth: Payer: Self-pay | Admitting: Neurosurgery

## 2024-01-11 DIAGNOSIS — F319 Bipolar disorder, unspecified: Secondary | ICD-10-CM | POA: Diagnosis not present

## 2024-01-11 DIAGNOSIS — L219 Seborrheic dermatitis, unspecified: Secondary | ICD-10-CM | POA: Diagnosis not present

## 2024-01-11 DIAGNOSIS — G8929 Other chronic pain: Secondary | ICD-10-CM | POA: Diagnosis not present

## 2024-01-11 DIAGNOSIS — R4189 Other symptoms and signs involving cognitive functions and awareness: Secondary | ICD-10-CM | POA: Diagnosis not present

## 2024-01-11 DIAGNOSIS — I10 Essential (primary) hypertension: Secondary | ICD-10-CM | POA: Diagnosis not present

## 2024-01-11 DIAGNOSIS — M542 Cervicalgia: Secondary | ICD-10-CM | POA: Diagnosis not present

## 2024-01-11 DIAGNOSIS — G20C Parkinsonism, unspecified: Secondary | ICD-10-CM | POA: Diagnosis not present

## 2024-01-11 DIAGNOSIS — G2401 Drug induced subacute dyskinesia: Secondary | ICD-10-CM | POA: Diagnosis not present

## 2024-01-11 DIAGNOSIS — M5416 Radiculopathy, lumbar region: Secondary | ICD-10-CM | POA: Diagnosis not present

## 2024-01-11 NOTE — Telephone Encounter (Signed)
  Media Information  Document Information  Is there a reason Dr.Smith needed this information?

## 2024-01-11 NOTE — Telephone Encounter (Signed)
 Notes from office visit yesterday have been printed and faxed to [facility].

## 2024-01-13 ENCOUNTER — Encounter: Payer: Medicare PPO | Admitting: Speech Pathology

## 2024-01-14 DIAGNOSIS — L219 Seborrheic dermatitis, unspecified: Secondary | ICD-10-CM | POA: Diagnosis not present

## 2024-01-14 DIAGNOSIS — G2401 Drug induced subacute dyskinesia: Secondary | ICD-10-CM | POA: Diagnosis not present

## 2024-01-14 DIAGNOSIS — R4189 Other symptoms and signs involving cognitive functions and awareness: Secondary | ICD-10-CM | POA: Diagnosis not present

## 2024-01-14 DIAGNOSIS — I10 Essential (primary) hypertension: Secondary | ICD-10-CM | POA: Diagnosis not present

## 2024-01-14 DIAGNOSIS — F319 Bipolar disorder, unspecified: Secondary | ICD-10-CM | POA: Diagnosis not present

## 2024-01-14 DIAGNOSIS — G8929 Other chronic pain: Secondary | ICD-10-CM | POA: Diagnosis not present

## 2024-01-14 DIAGNOSIS — M542 Cervicalgia: Secondary | ICD-10-CM | POA: Diagnosis not present

## 2024-01-14 DIAGNOSIS — G20C Parkinsonism, unspecified: Secondary | ICD-10-CM | POA: Diagnosis not present

## 2024-01-14 DIAGNOSIS — M5416 Radiculopathy, lumbar region: Secondary | ICD-10-CM | POA: Diagnosis not present

## 2024-01-18 ENCOUNTER — Encounter: Payer: Medicare PPO | Admitting: Speech Pathology

## 2024-01-18 ENCOUNTER — Ambulatory Visit: Payer: Medicare PPO

## 2024-01-18 ENCOUNTER — Telehealth: Payer: Self-pay

## 2024-01-18 DIAGNOSIS — F319 Bipolar disorder, unspecified: Secondary | ICD-10-CM | POA: Diagnosis not present

## 2024-01-18 DIAGNOSIS — L219 Seborrheic dermatitis, unspecified: Secondary | ICD-10-CM | POA: Diagnosis not present

## 2024-01-18 DIAGNOSIS — M542 Cervicalgia: Secondary | ICD-10-CM | POA: Diagnosis not present

## 2024-01-18 DIAGNOSIS — R4189 Other symptoms and signs involving cognitive functions and awareness: Secondary | ICD-10-CM | POA: Diagnosis not present

## 2024-01-18 DIAGNOSIS — I10 Essential (primary) hypertension: Secondary | ICD-10-CM | POA: Diagnosis not present

## 2024-01-18 DIAGNOSIS — G8929 Other chronic pain: Secondary | ICD-10-CM | POA: Diagnosis not present

## 2024-01-18 DIAGNOSIS — M5416 Radiculopathy, lumbar region: Secondary | ICD-10-CM | POA: Diagnosis not present

## 2024-01-18 DIAGNOSIS — G20C Parkinsonism, unspecified: Secondary | ICD-10-CM | POA: Diagnosis not present

## 2024-01-18 DIAGNOSIS — G2401 Drug induced subacute dyskinesia: Secondary | ICD-10-CM | POA: Diagnosis not present

## 2024-01-18 NOTE — Telephone Encounter (Signed)
 Called and spoke to [place], to he stated he reached out to the company and he has all the information he needs, no further information is needed

## 2024-01-18 NOTE — Telephone Encounter (Signed)
 Copied from CRM [id number]. Topic: Medical Record Request - Other >> Jan 18, 2024  1:49 PM Gery Pray wrote: Reason for CRM: Patient's husband, Molly Maduro, called to get Dr. Beryle Flock initiate a Med Alert for patient. Please contact Mr. [name] at [phone] for additional information.

## 2024-01-20 ENCOUNTER — Encounter: Payer: Medicare PPO | Admitting: Speech Pathology

## 2024-01-21 DIAGNOSIS — G20C Parkinsonism, unspecified: Secondary | ICD-10-CM | POA: Diagnosis not present

## 2024-01-21 DIAGNOSIS — R4189 Other symptoms and signs involving cognitive functions and awareness: Secondary | ICD-10-CM | POA: Diagnosis not present

## 2024-01-21 DIAGNOSIS — G2401 Drug induced subacute dyskinesia: Secondary | ICD-10-CM | POA: Diagnosis not present

## 2024-01-21 DIAGNOSIS — L219 Seborrheic dermatitis, unspecified: Secondary | ICD-10-CM | POA: Diagnosis not present

## 2024-01-21 DIAGNOSIS — I10 Essential (primary) hypertension: Secondary | ICD-10-CM | POA: Diagnosis not present

## 2024-01-21 DIAGNOSIS — M5416 Radiculopathy, lumbar region: Secondary | ICD-10-CM | POA: Diagnosis not present

## 2024-01-21 DIAGNOSIS — M542 Cervicalgia: Secondary | ICD-10-CM | POA: Diagnosis not present

## 2024-01-21 DIAGNOSIS — F319 Bipolar disorder, unspecified: Secondary | ICD-10-CM | POA: Diagnosis not present

## 2024-01-21 DIAGNOSIS — G8929 Other chronic pain: Secondary | ICD-10-CM | POA: Diagnosis not present

## 2024-01-24 ENCOUNTER — Ambulatory Visit (INDEPENDENT_AMBULATORY_CARE_PROVIDER_SITE_OTHER): Payer: Medicare PPO | Admitting: Psychiatry

## 2024-01-24 ENCOUNTER — Encounter: Payer: Self-pay | Admitting: Psychiatry

## 2024-01-24 VITALS — BP 142/84 | HR 102 | Temp 98.0°F | Ht 66.0 in | Wt 219.6 lb

## 2024-01-24 DIAGNOSIS — F411 Generalized anxiety disorder: Secondary | ICD-10-CM | POA: Diagnosis not present

## 2024-01-24 DIAGNOSIS — G2401 Drug induced subacute dyskinesia: Secondary | ICD-10-CM | POA: Diagnosis not present

## 2024-01-24 DIAGNOSIS — F3176 Bipolar disorder, in full remission, most recent episode depressed: Secondary | ICD-10-CM

## 2024-01-24 DIAGNOSIS — F5105 Insomnia due to other mental disorder: Secondary | ICD-10-CM

## 2024-01-24 MED ORDER — RISPERIDONE 0.25 MG PO TABS: 0.7500 mg | ORAL_TABLET | ORAL | 1 refills | Status: DC

## 2024-01-24 NOTE — Progress Notes (Unsigned)
 [facility] MD OP Progress Note  01/24/2024 2:07 PM Kendra [name]  MRN:  [id number]  Chief Complaint:  Chief Complaint  Patient presents with   Follow-up   Depression   Anxiety   Medication Refill   HPI: Kendra [name] is a 78 year old Caucasian female, married, retired, lives in [place], has a history of bipolar disorder, GAD, insomnia, hyperprolactinemia, multifocal stage II breast cancer status post lumpectomy, ovarian cyst removal, bilateral hearing loss was evaluated in the office today.  She has experienced a significant change in her walking ability, with steps becoming smaller  in nature. She does not stand up straight due to pain. Despite receiving physical therapy at home once a week and speech therapy, she reports decreased function.  She has been under the care of a pain management center where she received two injections followed by a nerve block, significantly reducing her pain. An MRI of the spine and brain in February showed issues with the lower back and spine, unrelated to a fall on New Year's Day. No falls have occurred since then.  Her current medications include gabapentin 300 mg three times a day, fluoxetine 60 mg daily, risperidone 0.5 mg twice a day, trazodone 100 mg at bedtime, losartan hydrochlorothiazide 50/12.5 mg daily, vitamin D3 500 units three times a day, and vitamin B12 injections once a month. She is no longer on carbidopa/levodopa after a non-Parkinson's diagnosis.  As per neurology according to collateral information obtained from spouse, patient no longer meets criteria for primary parkinsonism.  She experiences some urinary incontinence, attributed to forgetting to go to the bathroom. She uses night pants but prefers pads during the day and is reminded to use the restroom regularly to prevent accidents.  Her daily routine includes breakfast, showering, and assistance from caregivers who visit three times a week. She engages in activities like reading and  watching TV, sometimes requiring closed captions. She occasionally participates in Sudoku and other brain exercises.  She reports sleeping well through the night, with occasional awakenings to use the bathroom. No significant mood disturbances, stating she feels 'all right' and does not feel depressed or lack interest in activities. She enjoys reading magazines and listening to fiction read by caregivers.  She appeared to be alert, oriented to person place time and situation.  3 word memory immediate 3 out of 3, after 5 minutes 2 out of 3.  Attention and focus seem to be good, was able to do serial threes without much support.    Visit Diagnosis:    ICD-10-CM   1. Bipolar disorder, in full remission, most recent episode depressed (HCC)  F31.76 risperiDONE (RISPERDAL) 0.25 MG tablet    2. GAD (generalized anxiety disorder)  F41.1     3. Insomnia due to mental condition  F51.05    Mood, pain    4. Tardive dyskinesia  G24.01       Past Psychiatric History: I have reviewed past psychiatric history from progress note on 03/01/2019, past trials of medications like risperidone, Prozac, Seroquel, Ativan.  Past Medical History:  Past Medical History:  Diagnosis Date   Anxiety    Breast cancer (HCC) 03/2018   right breast cancer at 11:00 and 1:00   Bursitis    bilateral hips and knees   Complication of anesthesia    Depression    Family history of breast cancer 03/24/2018   Family history of lung cancer 03/24/2018   History of alcohol dependence (HCC) 2007   no ETOH; resolved since 2007  History of hiatal hernia    History of psychosis 2014   due to sleep disturbance   Hypertension    Personal history of radiation therapy 06/2018-07/2018   right breast ca   PONV (postoperative nausea and vomiting)     Past Surgical History:  Procedure Laterality Date   BREAST BIOPSY Right 03/14/2018   11:00 DCIS and invasive ductal carcinoma   BREAST BIOPSY Right 03/14/2018   1:00 Invasive  ductal carcinoma   BREAST LUMPECTOMY Right 04/27/2018   lumpectomy of 11 and 1:00 cancers, clear margins, negative LN   BREAST LUMPECTOMY WITH RADIOACTIVE SEED AND SENTINEL LYMPH NODE BIOPSY Right 04/27/2018   Procedure: RIGHT BREAST LUMPECTOMY WITH RADIOACTIVE SEED X 2 AND RIGHT SENTINEL LYMPH NODE BIOPSY;  Surgeon: Glenna Fellows, MD;  Location:  SURGERY CENTER;  Service: General;  Laterality: Right;   CATARACT EXTRACTION W/PHACO Left 08/30/2019   Procedure: CATARACT EXTRACTION PHACO AND INTRAOCULAR LENS PLACEMENT (IOC) LEFT panoptix toric  01:20.5  12.7%  10.26;  Surgeon: Lockie Mola, MD;  Location: [facility];  Service: Ophthalmology;  Laterality: Left;   CATARACT EXTRACTION W/PHACO Right 09/20/2019   Procedure: CATARACT EXTRACTION PHACO AND INTRAOCULAR LENS PLACEMENT (IOC) RIGHT 5.71  00:36.4  15.7%;  Surgeon: Lockie Mola, MD;  Location: [facility];  Service: Ophthalmology;  Laterality: Right;   LAPAROTOMY N/A 02/10/2018   Procedure: EXPLORATORY LAPAROTOMY;  Surgeon: Shonna Chock, MD;  Location: [facility];  Service: Gynecology;  Laterality: N/A;   MASS EXCISION  01/2018   abdominal   OVARIAN CYST REMOVAL     SALPINGOOPHORECTOMY Bilateral 02/10/2018   Procedure: BILATERAL SALPINGO OOPHORECTOMY; PERITONEAL WASHINGS;  Surgeon: Shonna Chock, MD;  Location: [facility];  Service: Gynecology;  Laterality: Bilateral;   TONSILLECTOMY     TONSILLECTOMY AND ADENOIDECTOMY  1953    Family Psychiatric History: I have reviewed family psychiatric history from progress note on 03/01/2019.  Family History:  Family History  Problem Relation Age of Onset   Diabetes Mother    Breast cancer Mother 71   Hypertension Mother    Lung cancer Father        asbestos exposure   Heart disease Brother 9   Breast cancer Cousin 73       paternal cousin   Heart disease Paternal Grandmother 16   Heart disease Paternal Grandfather     Social History: I have  reviewed social history from progress note on 03/01/2019. Social History   Socioeconomic History   Marital status: Married    Spouse name: robert   Number of children: 0   Years of education: Not on file   Highest education level: Master's degree (e.g., MA, MS, MEng, MEd, MSW, MBA)  Occupational History   Occupation: retired Insurance underwriter of education  Tobacco Use   Smoking status: Never   Smokeless tobacco: Never  Vaping Use   Vaping status: Never Used  Substance and Sexual Activity   Alcohol use: Not Currently    Comment: alcohol dependence prior to 2007   Drug use: Never   Sexual activity: Yes    Partners: Male    Birth control/protection: Surgical  Other Topics Concern   Not on file  Social History Narrative   Not on file   Social Drivers of Health   Financial Resource Strain: Low Risk  (11/24/2023)   Overall Financial Resource Strain (CARDIA)    Difficulty of Paying Living Expenses: Not hard at all  Food Insecurity: No Food Insecurity (11/24/2023)  Hunger Vital Sign    Worried About Running Out of Food in the Last Year: Never true    Ran Out of Food in the Last Year: Never true  Transportation Needs: No Transportation Needs (11/24/2023)   PRAPARE - Administrator, Civil Service (Medical): No    Lack of Transportation (Non-Medical): No  Physical Activity: Unknown (11/24/2023)   Exercise Vital Sign    Days of Exercise per Week: 0 days    Minutes of Exercise per Session: Not on file  Stress: Stress Concern Present (11/24/2023)   [employer] of Occupational Health - Occupational Stress Questionnaire    Feeling of Stress : To some extent  Social Connections: Moderately Integrated (11/24/2023)   Social Connection and Isolation Panel [NHANES]    Frequency of Communication with Friends and Family: Once a week    Frequency of Social Gatherings with Friends and Family: More than three times a week    Attends Religious Services: More than 4 times  per year    Active Member of [employer] or Organizations: No    Attends Banker Meetings: Never    Marital Status: Married  Recent Concern: Social Connections - Socially Isolated (11/18/2023)   Social Connection and Isolation Panel [NHANES]    Frequency of Communication with Friends and Family: Never    Frequency of Social Gatherings with Friends and Family: Once a week    Attends Religious Services: Never    Database administrator or Organizations: No    Attends Banker Meetings: Never    Marital Status: Married    Allergies:  Allergies  Allergen Reactions   Hydroxyzine Anaphylaxis    Tongue swollen   Other Other (See Comments)    Food poisoning    Shrimp [Shellfish Allergy] Other (See Comments)    Food poisoning     Metabolic Disorder Labs: Lab Results  Component Value Date   HGBA1C 5.9 (H) 11/05/2023   Lab Results  Component Value Date   PROLACTIN 78.3 (H) 01/04/2023   PROLACTIN 70.5 (H) 05/12/2022   Lab Results  Component Value Date   CHOL 171 11/05/2023   TRIG 115 11/05/2023   HDL 57 11/05/2023   CHOLHDL 3.0 11/05/2023   LDLCALC 93 11/05/2023   LDLCALC 113 (H) 11/06/2022   Lab Results  Component Value Date   TSH 1.653 01/04/2023   TSH 2.31 04/13/2019    Therapeutic Level Labs: No results found for: "LITHIUM" No results found for: "VALPROATE" No results found for: "CBMZ"  Current Medications: Current Outpatient Medications  Medication Sig Dispense Refill   Acetaminophen 500 MG capsule Take 1,000 mg by mouth 2 (two) times daily.     AMBULATORY NON FORMULARY MEDICATION 1 Bag by Other route daily. HERO smart dispenser 1 Bag 0   cyanocobalamin (VITAMIN B12) 1000 MCG/ML injection INJECT 1 ML INTO THE MUSCLE EVERY 30 DAYS 3 mL 3   denosumab (PROLIA) 60 MG/ML SOSY injection Inject 60 mg into the skin every 6 (six) months. takes annually     FLUoxetine HCl 60 MG TABS Take 1 tablet by mouth daily. 90 tablet 3   gabapentin (NEURONTIN) 300  MG capsule TAKE 1 CAPSULE BY MOUTH 3 TIMES A DAY 90 capsule 1   losartan-hydrochlorothiazide (HYZAAR) 50-12.5 MG tablet Take 1 tablet by mouth daily. 90 tablet 1   Multiple Vitamins-Minerals (VITAMIN D3 COMPLETE PO) Take 500 Units by mouth.     nystatin (MYCOSTATIN/NYSTOP) powder APPLY TO AFFECTED AREA THREE TIMES A  DAY 60 g 0   risperiDONE (RISPERDAL) 0.25 MG tablet Take 3 tablets (0.75 mg total) by mouth as directed. Take 1 tablet daily morning and 2 tablets daily at bedtime 270 tablet 1   traZODone (DESYREL) 100 MG tablet Take 1 tablet (100 mg total) by mouth at bedtime. 90 tablet 3   No current facility-administered medications for this visit.     Musculoskeletal: Strength & Muscle Tone: within normal limits Gait & Station: shuffle Patient leans: Front  Psychiatric Specialty Exam: Review of Systems  Psychiatric/Behavioral:  The patient is nervous/anxious.     Blood pressure (!) 142/84, pulse (!) 102, temperature 98 F (36.7 C), temperature source Temporal, height 5\' 6"  (1.676 m), weight 219 lb 9.6 oz (99.6 kg), SpO2 98%.Body mass index is 35.44 kg/m.  General Appearance: Casual  Eye Contact:  Fair  Speech:  Clear and Coherent  Volume:  Normal  Mood:  Anxious  Affect:  Flat  Thought Process:  Goal Directed and Descriptions of Associations: Intact  Orientation:  Full (Time, Place, and Person)  Thought Content: Logical   Suicidal Thoughts:  No  Homicidal Thoughts:  No  Memory:  Immediate;   Fair Recent;   Fair Remote;   limited  Judgement:  Fair  Insight:  Fair  Psychomotor Activity:  Tremor mild, bilateral hands  Concentration:  Concentration: Fair and Attention Span: Fair  Recall:  [employer] of Knowledge: Fair  Language: Fair  Akathisia:  No  Handed:  Right  AIMS (if indicated): done, has TD chronic , lip movements   Assets:  Desire for Improvement Housing Social Support  ADL's:  Intact  Cognition: WNL  Sleep:  Fair   Screenings: Therapist, sports Visit from 01/24/2024 in [facility] Office Visit from 08/10/2023 in [facility] Office Visit from 04/08/2023 in [facility] Visit from 09/04/2022 in [facility] Office Visit from 06/04/2022 in [facility]  AIMS Total Score 1 0 0 0 0      GAD-7    Flowsheet Row Office Visit from 01/24/2024 in [facility] Office Visit from 11/29/2023 in [facility] Office Visit from 11/05/2023 in [facility] Office Visit from 08/10/2023 in [facility] Counselor from 03/25/2021 in [facility]  Total GAD-7 Score 1 8 12 9 4       PHQ2-9    Flowsheet Row Office Visit from 01/24/2024 in [facility] Office Visit from 11/29/2023 in [facility] Office Visit from 11/22/2023 in [facility] Visit from 11/05/2023 in [facility] Office Visit from 08/10/2023 in [facility]  PHQ-2 Total Score 0 1 1 6 2   PHQ-9 Total Score 7 10 -- 18 17      Flowsheet Row Office Visit from 01/24/2024 in [facility] Video Visit from 09/09/2023 in [facility] Office Visit from 08/10/2023 in [facility]  C-SSRS RISK CATEGORY No Risk No Risk No Risk        Assessment and Plan: Kendra [name] is a 78 year old Caucasian female, lives in [place], has a history of bipolar disorder, generalized anxiety disorder was evaluated  in office today for a follow-up  appointment.  Discussed assessment and plan as noted below.  Bipolar disorder type I in remission Generalized anxiety disorder-stable On multiple medications, including gabapentin, fluoxetine, risperidone, trazodone. Gabapentin dosage recently increased to 300 mg three times a day. Plan to reduce risperidone dosage due to potential long-term side effects such as tardive dyskinesia and effects on weight and heart. Not on carbidopa/levodopa anymore after Parkinson's diagnosis was removed. Gabapentin serves as a mood stabilizer, potentially allowing for reduction in risperidone. - Reduce Risperidone to 0.25 mg in the morning and 0.5 mg at night - Monitor for changes in mood or increased sedation - Continue Prozac 60 mg daily - Continue Gabapentin 300 mg 3 times a day, prescribed by neurology although it is also a mood stabilizer.  Insomnia/snoring-stable Currently reports sleep is overall good. - Continue Trazodone 100 mg at bedtime. - Patient was referred for sleep study previously.  Collateral information obtained from spouse who reports patient is currently doing fairly well with regards to mood and agrees to tapering down Risperidone.  Follow-up - Follow up in clinic in 6 to 8 weeks or sooner if needed.    Consent: Patient/Guardian gives verbal consent for treatment and assignment of benefits for services provided during this visit. Patient/Guardian expressed understanding and agreed to proceed.  This note was generated in part or whole with voice recognition software. Voice recognition is usually quite accurate but there are transcription errors that can and very often do occur. I apologize for any typographical errors that were not detected and corrected.   Discussed the use of a AI scribe software for clinical note transcription with the patient, who gave verbal consent to proceed.    Jomarie Longs, MD 01/25/2024, 8:26 AM

## 2024-01-25 ENCOUNTER — Ambulatory Visit: Payer: Medicare PPO

## 2024-01-25 ENCOUNTER — Encounter: Payer: Medicare PPO | Admitting: Speech Pathology

## 2024-01-25 DIAGNOSIS — G2401 Drug induced subacute dyskinesia: Secondary | ICD-10-CM | POA: Diagnosis not present

## 2024-01-25 DIAGNOSIS — R4189 Other symptoms and signs involving cognitive functions and awareness: Secondary | ICD-10-CM | POA: Diagnosis not present

## 2024-01-25 DIAGNOSIS — G8929 Other chronic pain: Secondary | ICD-10-CM | POA: Diagnosis not present

## 2024-01-25 DIAGNOSIS — L219 Seborrheic dermatitis, unspecified: Secondary | ICD-10-CM | POA: Diagnosis not present

## 2024-01-25 DIAGNOSIS — F319 Bipolar disorder, unspecified: Secondary | ICD-10-CM | POA: Diagnosis not present

## 2024-01-25 DIAGNOSIS — I10 Essential (primary) hypertension: Secondary | ICD-10-CM | POA: Diagnosis not present

## 2024-01-25 DIAGNOSIS — G20C Parkinsonism, unspecified: Secondary | ICD-10-CM | POA: Diagnosis not present

## 2024-01-25 DIAGNOSIS — M5416 Radiculopathy, lumbar region: Secondary | ICD-10-CM | POA: Diagnosis not present

## 2024-01-25 DIAGNOSIS — M542 Cervicalgia: Secondary | ICD-10-CM | POA: Diagnosis not present

## 2024-01-28 ENCOUNTER — Ambulatory Visit: Payer: Self-pay

## 2024-01-28 DIAGNOSIS — R4189 Other symptoms and signs involving cognitive functions and awareness: Secondary | ICD-10-CM | POA: Diagnosis not present

## 2024-01-28 DIAGNOSIS — M542 Cervicalgia: Secondary | ICD-10-CM | POA: Diagnosis not present

## 2024-01-28 DIAGNOSIS — F319 Bipolar disorder, unspecified: Secondary | ICD-10-CM | POA: Diagnosis not present

## 2024-01-28 DIAGNOSIS — M5416 Radiculopathy, lumbar region: Secondary | ICD-10-CM | POA: Diagnosis not present

## 2024-01-28 DIAGNOSIS — I10 Essential (primary) hypertension: Secondary | ICD-10-CM | POA: Diagnosis not present

## 2024-01-28 DIAGNOSIS — G8929 Other chronic pain: Secondary | ICD-10-CM | POA: Diagnosis not present

## 2024-01-28 DIAGNOSIS — G20C Parkinsonism, unspecified: Secondary | ICD-10-CM | POA: Diagnosis not present

## 2024-01-28 DIAGNOSIS — G2401 Drug induced subacute dyskinesia: Secondary | ICD-10-CM | POA: Diagnosis not present

## 2024-01-28 DIAGNOSIS — L219 Seborrheic dermatitis, unspecified: Secondary | ICD-10-CM | POA: Diagnosis not present

## 2024-01-28 NOTE — Telephone Encounter (Signed)
  Chief Complaint: dizziness with standing Symptoms: same  Disposition: [] ED /[] Urgent Care (no appt availability in office) / [] Appointment(In office/virtual)/ []  [facility]/ [] Home Care/ [] Refused Recommended Disposition /[] [facility]/ [x]  Follow-up with PCP Additional Notes: pt has neurologist- advised to call specialist.  Spouse stated the therapist know more than he does. Not much hx and felt rushed to end call.  Copied from CRM [id number]. Topic: Clinical - Red Word Triage >> Jan 28, 2024 10:36 AM Almira Coaster wrote: Red Word that prompted transfer to Nurse Triage: Patient's husband Dakoda Bassette is calling because patient has been experiencing dizziness but seems to happened only when she's standing. Husband is not sure how long this has been going on but the physical therapist recommended contacting the office for triage. Reason for Disposition  [1] MODERATE dizziness (e.g., interferes with normal activities) AND [2] has NOT been evaluated by doctor (or NP/PA) for this  (Exception: Dizziness caused by heat exposure, sudden standing, or poor fluid intake.)  Answer Assessment - Initial Assessment Questions 1. DESCRIPTION: "Describe your dizziness."     2. LIGHTHEADED: "Do you feel lightheaded?" (e.g., somewhat faint, woozy, weak upon standing)     Just mentioned dizziness  4. SEVERITY: "How bad is it?"  "Do you feel like you are going to faint?" "Can you stand and walk?"   - MILD: Feels slightly dizzy, but walking normally.   - MODERATE: Feels unsteady when walking, but not falling; interferes with normal activities (e.g., school, work).   - SEVERE: Unable to walk without falling, or requires assistance to walk without falling; feels like passing out now.      *No Answer* 5. ONSET:  "When did the dizziness begin?"     Unsure of date of onset 6. AGGRAVATING FACTORS: "Does anything make it worse?" (e.g., standing, change in head position)     standing  8. CAUSE:  "What do you think is causing the dizziness?"     unsure  Protocols used: Dizziness - Lightheadedness-A-AH

## 2024-01-28 NOTE — Telephone Encounter (Signed)
 FYI

## 2024-02-01 ENCOUNTER — Encounter: Payer: Medicare PPO | Admitting: Speech Pathology

## 2024-02-01 ENCOUNTER — Ambulatory Visit: Payer: Medicare PPO

## 2024-02-01 DIAGNOSIS — G2401 Drug induced subacute dyskinesia: Secondary | ICD-10-CM | POA: Diagnosis not present

## 2024-02-01 DIAGNOSIS — M5416 Radiculopathy, lumbar region: Secondary | ICD-10-CM | POA: Diagnosis not present

## 2024-02-01 DIAGNOSIS — M542 Cervicalgia: Secondary | ICD-10-CM | POA: Diagnosis not present

## 2024-02-01 DIAGNOSIS — L219 Seborrheic dermatitis, unspecified: Secondary | ICD-10-CM | POA: Diagnosis not present

## 2024-02-01 DIAGNOSIS — I10 Essential (primary) hypertension: Secondary | ICD-10-CM | POA: Diagnosis not present

## 2024-02-01 DIAGNOSIS — F319 Bipolar disorder, unspecified: Secondary | ICD-10-CM | POA: Diagnosis not present

## 2024-02-01 DIAGNOSIS — G8929 Other chronic pain: Secondary | ICD-10-CM | POA: Diagnosis not present

## 2024-02-01 DIAGNOSIS — G20C Parkinsonism, unspecified: Secondary | ICD-10-CM | POA: Diagnosis not present

## 2024-02-01 DIAGNOSIS — R4189 Other symptoms and signs involving cognitive functions and awareness: Secondary | ICD-10-CM | POA: Diagnosis not present

## 2024-02-03 ENCOUNTER — Encounter: Payer: Medicare PPO | Admitting: Speech Pathology

## 2024-02-03 ENCOUNTER — Telehealth: Payer: Self-pay | Admitting: Psychiatry

## 2024-02-03 DIAGNOSIS — G20C Parkinsonism, unspecified: Secondary | ICD-10-CM | POA: Diagnosis not present

## 2024-02-03 DIAGNOSIS — G8929 Other chronic pain: Secondary | ICD-10-CM | POA: Diagnosis not present

## 2024-02-03 DIAGNOSIS — G2401 Drug induced subacute dyskinesia: Secondary | ICD-10-CM | POA: Diagnosis not present

## 2024-02-03 DIAGNOSIS — R4189 Other symptoms and signs involving cognitive functions and awareness: Secondary | ICD-10-CM | POA: Diagnosis not present

## 2024-02-03 DIAGNOSIS — M542 Cervicalgia: Secondary | ICD-10-CM | POA: Diagnosis not present

## 2024-02-03 DIAGNOSIS — L219 Seborrheic dermatitis, unspecified: Secondary | ICD-10-CM | POA: Diagnosis not present

## 2024-02-03 DIAGNOSIS — F319 Bipolar disorder, unspecified: Secondary | ICD-10-CM | POA: Diagnosis not present

## 2024-02-03 DIAGNOSIS — I10 Essential (primary) hypertension: Secondary | ICD-10-CM | POA: Diagnosis not present

## 2024-02-03 DIAGNOSIS — M5416 Radiculopathy, lumbar region: Secondary | ICD-10-CM | POA: Diagnosis not present

## 2024-02-03 NOTE — Telephone Encounter (Signed)
 spoke with pharmacist she stated that pt picked up 7 dats ago. she stated that next time pt has filled you may have to do a .50mg  and then a .25 because it was a quantity limit.

## 2024-02-03 NOTE — Telephone Encounter (Signed)
 Do you know how many days supply patient was able to pick up ?

## 2024-02-03 NOTE — Telephone Encounter (Signed)
 Received a letter from [facility] regarding risperidone 0.25 mg-quantity limit.  Will consider changing this prescription to risperidone 0.5 mg, take 1-1/2 tablet to make at 0.75 mg.  Not sure if this will address the current issue.  Will have staff contact to verify.

## 2024-02-04 NOTE — Telephone Encounter (Signed)
 90 day supply

## 2024-02-04 NOTE — Telephone Encounter (Signed)
 Thank you :)

## 2024-02-07 ENCOUNTER — Telehealth: Payer: Self-pay

## 2024-02-07 NOTE — Telephone Encounter (Signed)
 called spouse. explained that insurance would only pay for 30 day supply. he wanted to know why a 90 day supply would not be covered. some insurance only allow certain medications to be filled as a 90 day and then there is others that only do 30 day supply.   Advised pt husband that a week before she starts to run out of medication. Requested the refill at the pharmacy and we will see what happens next.  The pharmacist states that a prior auth was not required it was that insurance would not pay for 90 day so they had to only do 30 days

## 2024-02-07 NOTE — Telephone Encounter (Signed)
 pt spouse states that it is a issue with getting the risperidone.

## 2024-02-07 NOTE — Telephone Encounter (Signed)
 called pharmacy insurance would not pay for a 90 day supply only 30 days.

## 2024-02-08 ENCOUNTER — Ambulatory Visit: Payer: Medicare PPO

## 2024-02-08 ENCOUNTER — Encounter: Payer: Medicare PPO | Admitting: Speech Pathology

## 2024-02-08 DIAGNOSIS — G2401 Drug induced subacute dyskinesia: Secondary | ICD-10-CM | POA: Diagnosis not present

## 2024-02-08 DIAGNOSIS — M542 Cervicalgia: Secondary | ICD-10-CM | POA: Diagnosis not present

## 2024-02-08 DIAGNOSIS — G8929 Other chronic pain: Secondary | ICD-10-CM | POA: Diagnosis not present

## 2024-02-08 DIAGNOSIS — R4189 Other symptoms and signs involving cognitive functions and awareness: Secondary | ICD-10-CM | POA: Diagnosis not present

## 2024-02-08 DIAGNOSIS — M5416 Radiculopathy, lumbar region: Secondary | ICD-10-CM | POA: Diagnosis not present

## 2024-02-08 DIAGNOSIS — L219 Seborrheic dermatitis, unspecified: Secondary | ICD-10-CM | POA: Diagnosis not present

## 2024-02-08 DIAGNOSIS — F319 Bipolar disorder, unspecified: Secondary | ICD-10-CM | POA: Diagnosis not present

## 2024-02-08 DIAGNOSIS — G20C Parkinsonism, unspecified: Secondary | ICD-10-CM | POA: Diagnosis not present

## 2024-02-08 DIAGNOSIS — I10 Essential (primary) hypertension: Secondary | ICD-10-CM | POA: Diagnosis not present

## 2024-02-10 ENCOUNTER — Other Ambulatory Visit: Payer: Self-pay | Admitting: Internal Medicine

## 2024-02-10 ENCOUNTER — Encounter: Payer: Medicare PPO | Admitting: Speech Pathology

## 2024-02-11 DIAGNOSIS — G20C Parkinsonism, unspecified: Secondary | ICD-10-CM | POA: Diagnosis not present

## 2024-02-11 DIAGNOSIS — I10 Essential (primary) hypertension: Secondary | ICD-10-CM | POA: Diagnosis not present

## 2024-02-11 DIAGNOSIS — F319 Bipolar disorder, unspecified: Secondary | ICD-10-CM | POA: Diagnosis not present

## 2024-02-11 DIAGNOSIS — M5416 Radiculopathy, lumbar region: Secondary | ICD-10-CM | POA: Diagnosis not present

## 2024-02-11 DIAGNOSIS — M542 Cervicalgia: Secondary | ICD-10-CM | POA: Diagnosis not present

## 2024-02-11 DIAGNOSIS — G2401 Drug induced subacute dyskinesia: Secondary | ICD-10-CM | POA: Diagnosis not present

## 2024-02-11 DIAGNOSIS — G8929 Other chronic pain: Secondary | ICD-10-CM | POA: Diagnosis not present

## 2024-02-11 DIAGNOSIS — R4189 Other symptoms and signs involving cognitive functions and awareness: Secondary | ICD-10-CM | POA: Diagnosis not present

## 2024-02-11 DIAGNOSIS — L219 Seborrheic dermatitis, unspecified: Secondary | ICD-10-CM | POA: Diagnosis not present

## 2024-02-15 ENCOUNTER — Ambulatory Visit: Payer: Medicare PPO

## 2024-02-15 ENCOUNTER — Encounter: Payer: Medicare PPO | Admitting: Speech Pathology

## 2024-02-17 ENCOUNTER — Encounter: Payer: Medicare PPO | Admitting: Speech Pathology

## 2024-02-17 DIAGNOSIS — G2401 Drug induced subacute dyskinesia: Secondary | ICD-10-CM | POA: Diagnosis not present

## 2024-02-17 DIAGNOSIS — R4189 Other symptoms and signs involving cognitive functions and awareness: Secondary | ICD-10-CM | POA: Diagnosis not present

## 2024-02-17 DIAGNOSIS — M542 Cervicalgia: Secondary | ICD-10-CM | POA: Diagnosis not present

## 2024-02-17 DIAGNOSIS — G8929 Other chronic pain: Secondary | ICD-10-CM | POA: Diagnosis not present

## 2024-02-17 DIAGNOSIS — G20C Parkinsonism, unspecified: Secondary | ICD-10-CM | POA: Diagnosis not present

## 2024-02-17 DIAGNOSIS — L219 Seborrheic dermatitis, unspecified: Secondary | ICD-10-CM | POA: Diagnosis not present

## 2024-02-17 DIAGNOSIS — F319 Bipolar disorder, unspecified: Secondary | ICD-10-CM | POA: Diagnosis not present

## 2024-02-17 DIAGNOSIS — M5416 Radiculopathy, lumbar region: Secondary | ICD-10-CM | POA: Diagnosis not present

## 2024-02-17 DIAGNOSIS — I10 Essential (primary) hypertension: Secondary | ICD-10-CM | POA: Diagnosis not present

## 2024-02-21 ENCOUNTER — Ambulatory Visit: Admitting: Neurosurgery

## 2024-02-21 ENCOUNTER — Encounter: Payer: Self-pay | Admitting: Neurosurgery

## 2024-02-21 VITALS — BP 122/72 | Ht 66.0 in | Wt 219.0 lb

## 2024-02-21 DIAGNOSIS — M48062 Spinal stenosis, lumbar region with neurogenic claudication: Secondary | ICD-10-CM | POA: Insufficient documentation

## 2024-02-21 NOTE — Progress Notes (Signed)
 Referring Physician:  Mazie Speed, MD [address] [place],  [place] [address]  Primary Physician:  Mazie Speed, MD  History of Present Illness: 02/21/2024 [name] is here today with a chief complaint of history of back pain and right lower extremity pain.  She has been working with physical therapy as well as Dr. Naveira.  Thankfully she has had a almost complete resolution of her back pain and radiating right lower extremity pain.  She is here today now to discuss mostly claudicatory symptoms.  When she walks she feels significant/severe heaviness achiness and some numbness in her legs.  She does notice that this improves with her walker and she has been working with home health physical therapy and getting some improvements.  Conservative measures:  Physical therapy: has participated in at [facility] and was discharged on 11/23/23.  Currently working with home health physical therapy Multimodal medical therapy including regular antiinflammatories: tylenol , gabapentin  Injections:  12/14/2023 - Lumbar facet block bilaterally (Level: L2, L3, L4, L5, S1, and TBD Medial Branch Nerves) 01/12/23: Left L2-3 IL ESI (Dr. Barth Borne) 02/24/22: Left L2-3 IL ESI (Dr. Barth Borne) 08/14/2021: Left L5-S1 and left S1 transforaminal ESI (Dr. Erman Hayward) 06/27/2021: Bilateral L5-S1 transforaminal ESI (75% relief-Dr. Erman Hayward)  Past Surgery: no spinal surgeries  The symptoms are causing a significant impact on the patient's life.   I have utilized the care everywhere function in epic to review the outside records available from external health systems.  Review of Systems:  A 10 point review of systems is negative, except for the pertinent positives and negatives detailed in the HPI.  Past Medical History: Past Medical History:  Diagnosis Date   Anxiety    Breast cancer (HCC) 03/2018   right breast cancer at 11:00 and 1:00   Bursitis    bilateral hips and knees    Complication of anesthesia    Depression    Family history of breast cancer 03/24/2018   Family history of lung cancer 03/24/2018   History of alcohol dependence (HCC) 2007   no ETOH; resolved since 2007   History of hiatal hernia    History of psychosis 2014   due to sleep disturbance   Hypertension    Personal history of radiation therapy 06/2018-07/2018   right breast ca   PONV (postoperative nausea and vomiting)     Past Surgical History: Past Surgical History:  Procedure Laterality Date   BREAST BIOPSY Right 03/14/2018   11:00 DCIS and invasive ductal carcinoma   BREAST BIOPSY Right 03/14/2018   1:00 Invasive ductal carcinoma   BREAST LUMPECTOMY Right 04/27/2018   lumpectomy of 11 and 1:00 cancers, clear margins, negative LN   BREAST LUMPECTOMY WITH RADIOACTIVE SEED AND SENTINEL LYMPH NODE BIOPSY Right 04/27/2018   Procedure: RIGHT BREAST LUMPECTOMY WITH RADIOACTIVE SEED X 2 AND RIGHT SENTINEL LYMPH NODE BIOPSY;  Surgeon: Ayesha Lente, MD;  Location: [facility];  Service: General;  Laterality: Right;   CATARACT EXTRACTION W/PHACO Left 08/30/2019   Procedure: CATARACT EXTRACTION PHACO AND INTRAOCULAR LENS PLACEMENT (IOC) LEFT panoptix toric  01:20.5  12.7%  10.26;  Surgeon: Annell Kidney, MD;  Location: [facility];  Service: Ophthalmology;  Laterality: Left;   CATARACT EXTRACTION W/PHACO Right 09/20/2019   Procedure: CATARACT EXTRACTION PHACO AND INTRAOCULAR LENS PLACEMENT (IOC) RIGHT 5.71  00:36.4  15.7%;  Surgeon: Annell Kidney, MD;  Location: [facility];  Service: Ophthalmology;  Laterality: Right;   LAPAROTOMY N/A 02/10/2018  Procedure: EXPLORATORY LAPAROTOMY;  Surgeon: Lyn Sanders, MD;  Location: [facility];  Service: Gynecology;  Laterality: N/A;   MASS EXCISION  01/2018   abdominal   OVARIAN CYST REMOVAL     SALPINGOOPHORECTOMY Bilateral 02/10/2018   Procedure: BILATERAL SALPINGO OOPHORECTOMY; PERITONEAL WASHINGS;   Surgeon: Lyn Sanders, MD;  Location: [facility];  Service: Gynecology;  Laterality: Bilateral;   TONSILLECTOMY     TONSILLECTOMY AND ADENOIDECTOMY  1953    Allergies: Allergies as of 02/21/2024 - Review Complete 01/24/2024  Allergen Reaction Noted   Hydroxyzine Anaphylaxis 02/07/2018   Other Other (See Comments) 02/07/2018   Shrimp [shellfish allergy] Other (See Comments) 02/07/2018    Medications:  Current Outpatient Medications:    Acetaminophen  500 MG capsule, Take 1,000 mg by mouth 2 (two) times daily., Disp: , Rfl:    AMBULATORY NON FORMULARY MEDICATION, 1 Bag by Other route daily. HERO smart dispenser, Disp: 1 Bag, Rfl: 0   cyanocobalamin  (VITAMIN B12) 1000 MCG/ML injection, INJECT 1 ML INTO THE MUSCLE EVERY 30 DAYS, Disp: 3 mL, Rfl: 3   denosumab  (PROLIA ) 60 MG/ML SOSY injection, Inject 60 mg into the skin every 6 (six) months. takes annually, Disp: , Rfl:    FLUoxetine  HCl 60 MG TABS, Take 1 tablet by mouth daily., Disp: 90 tablet, Rfl: 3   gabapentin  (NEURONTIN ) 300 MG capsule, TAKE 1 CAPSULE BY MOUTH 3 TIMES A DAY, Disp: 90 capsule, Rfl: 1   losartan -hydrochlorothiazide  (HYZAAR) 50-12.5 MG tablet, Take 1 tablet by mouth daily., Disp: 90 tablet, Rfl: 1   Multiple Vitamins-Minerals (VITAMIN D3 COMPLETE PO), Take 500 Units by mouth., Disp: , Rfl:    nystatin  (MYCOSTATIN /NYSTOP ) powder, APPLY TO AFFECTED AREA(S) THREE TIMES A DAY, Disp: 60 g, Rfl: 0   risperiDONE  (RISPERDAL ) 0.25 MG tablet, Take 3 tablets (0.75 mg total) by mouth as directed. Take 1 tablet daily morning and 2 tablets daily at bedtime, Disp: 270 tablet, Rfl: 1   traZODone  (DESYREL ) 100 MG tablet, Take 1 tablet (100 mg total) by mouth at bedtime., Disp: 90 tablet, Rfl: 3  Social History: Social History   Tobacco Use   Smoking status: Never   Smokeless tobacco: Never  Vaping Use   Vaping status: Never Used  Substance Use Topics   Alcohol use: Not Currently    Comment: alcohol dependence prior to 2007   Drug  use: Never    Family Medical History: Family History  Problem Relation Age of Onset   Diabetes Mother    Breast cancer Mother 75   Hypertension Mother    Lung cancer Father        asbestos exposure   Heart disease Brother 19   Breast cancer Cousin 2       paternal cousin   Heart disease Paternal Grandmother 65   Heart disease Paternal Grandfather     Physical Examination: There were no vitals filed for this visit.   General: Patient is in no apparent distress. Attention to examination is appropriate.  Neck:   Supple.  Full range of motion.  Respiratory: Patient is breathing without any difficulty.   NEUROLOGICAL:     Awake, alert, oriented to person, place, and time.  Speech is clear and fluent.   Cranial Nerves: Pupils equal round and reactive to light.  Facial tone is symmetric.  Facial sensation is symmetric. Shoulder shrug is symmetric. Tongue protrusion is midline.    Strength:  Side Iliopsoas Quads Hamstring PF DF EHL  R 5 5 5  4+ 4+  4+  L 5 5 5  4+ 4+ 3   Reflexes are decreased at the bilateral ankles, 2+ at the bilateral knees  Grossly intact to sensation testing  Gait is walker assisted, patient prefers to walk with a walker out in front of her  Imaging: Narrative & Impression  CLINICAL DATA:  Low back pain, lumbar radiculopathy.   EXAM: MRI LUMBAR SPINE WITHOUT CONTRAST   TECHNIQUE: Multiplanar, multisequence MR imaging of the lumbar spine was performed. No intravenous contrast was administered.   COMPARISON:  04/01/2020   FINDINGS: Segmentation:  Standard.   Alignment:  Mild degenerative anterolisthesis at L4-5, chronic   Vertebrae: Endplate edema at Z6-1 best attributed to degeneration with T2 hypointense collapse of the disc and endplate degeneration eccentric to the left. There is some superimposed T2 hypointensity and endplate collapse possible at L2 towards the left.   Conus medullaris and cauda equina: Conus extends to the  T12-L1 level. Conus is normal. There is cauda equina redundancy from the degree of spinal stenosis.   Paraspinal and other soft tissues: No perispinal mass or inflammation.   Disc levels:   L1-L2: Disc collapse and endplate degeneration with circumferential disc bulging and central to left paracentral extrusion. Left paracentral there is upward migration compressing the descending left L1 nerve root. Spinal stenosis is severe. High-grade left foraminal impingement   L2-L3: Disc narrowing and bulging with facet spurring and ligamentum flavum thickening. Congenitally narrow lumbar spinal canal from short pedicles. Chronic and advanced spinal stenosis. Moderate left foraminal narrowing based on sagittal images primarily.   L3-L4: Bulky degenerative facet spurring with joint effusions and ligamentum flavum thickening. The disc is narrowed with severe spinal stenosis accentuated by short pedicles. 5 mm degenerative ganglion in the posterior midline of the ligamentum flavum with limited contribution to the spinal stenosis.   L4-L5: Bulky degenerative facet spurring with anterolisthesis. The disc is narrowed and bulging with moderate spinal stenosis. Asymmetric effacement of the right subarticular recess. Moderate left and advanced right foraminal impingement.   L5-S1:Mild facet spurring.  No neural compression.   IMPRESSION: 1. Advanced and generalized lumbar spine degeneration that is progressed from 2021. 2. Degeneration and short pedicles causes severe spinal stenosis from L1-2 to L3-4 and moderate stenosis at L4-5. At L1-2 there has been notable progression since 2021 with endplate marrow edema and migrating disc extrusion. Cannot exclude a superior endplate fracture at L2. 3. Advanced foraminal impingement on the right at L4-5 and left at L1-2. Moderate foraminal narrowing on the left at L2-3 and L4-5. 4. Motion degraded.     Electronically Signed   By: Ronnette Coke  M.D.   On: 12/22/2023 08:02   Narrative & Impression  CLINICAL DATA:  Chronic low back pain.  Left hip pain.   EXAM: LUMBAR SPINE - COMPLETE WITH BENDING VIEWS   COMPARISON:  Lumbar spine 12/22/2021.   FINDINGS: Lumbar spine numbered with the lowest segmented appearing lumbar shaped vertebrae on lateral view as L5. Lumbar spine scoliosis concave right. Diffuse multilevel disc degeneration with endplate osteophyte formation. Disc degeneration most prominent at L2-L3 and L5-S1. 5 mm anterolisthesis L4 on L5. Diffuse severe facet hypertrophy. No flexion or extension abnormality identified. No evidence of fracture. Sclerotic densities are noted in the L2 and L4 vertebral bodies. Although these could represent bone islands blastic metastatic disease cannot be excluded and MRI of the lumbar spine suggested for further evaluation. Large amount of stool noted throughout the colon. Aortoiliac atherosclerotic vascular calcification.   IMPRESSION: 1. Lumbar  spine scoliosis concave right. Diffuse multilevel degenerative change. Disc degeneration most prominent at L2-L3 and L5-S1. 5 mm anterolisthesis L4 on L5. Diffuse severe facet hypertrophy. No flexion or extension abnormality identified. No evidence of fracture. 2. Sclerotic densities are noted in the L2 and L4 vertebral bodies. Although these could represent bone islands blastic metastatic disease cannot be excluded. MRI of the lumbar spine suggested for further evaluation. 3. Large amount of stool noted throughout the colon. 4. Aortoiliac atherosclerotic vascular disease.     Electronically Signed   By: Andy Bannister  Register M.D.   On: 01/05/2023 08:55      I have personally reviewed the images and agree with the above interpretation.  Outside Record Review: Maurine Sovereign 01/03/24 "Miss Sima has advanced lumbar spine degeneration with severe spinal stenosis from L1 to L3-4 and moderate stenosis at L4-5. MRI indicates progression  since 2021, with end plate marrow edema and a possible superior end plate fracture at L2. There is advanced foraminal impingement on the right at L4-5 and left at L1-2, with moderate foraminal narrowing on the left at L2-3 and L4-5. She reports 95% improvement in low back and leg pain following a diagnostic bilateral lumbar facet block and is currently asymptomatic. Discussed the benefits of neurosurgical evaluation while symptomatic to consider surgical options. If pain recurs, treatment will be guided by pain type: lumbar facet block for pain from outside spinal structures, or epidural injections if leg pain predominates. Refer to neurosurgery for evaluation of spinal stenosis and potential surgical options. Repeat lumbar facet block if pain recurs from outside spinal structures. Consider epidural injections if leg pain predominates upon recurrence. Call if pain recurs and manage referral to neurosurgery. "  Medical Decision Making/Assessment and Plan: Ms. Sprung is a pleasant 78 y.o. female with history of chronic back pain and right lumbar radiculopathy.  She has been managed by our pain team and has had recent facet injections with Dr. Naveira which caused a complete improvement in her back pain and right lower extremity pain.  Overall she is very pleased with this.   Today we discussed mostly her claudicatory symptoms.  She states that her legs feel heavy and fatigued after ambulating short.  Of time and short distances.  She has been working with physical therapy, she does feel like she has better function with a walker and has had some improvements.  I did discuss with her that I feel like a majority of her current symptoms including the heaviness in her legs early fatigue, tightness and feeling sore while exerting herself is likely secondary to her severe lumbar stenosis.  That given that she has minimal back pain at this time a decompression type surgery for improving the diameter of her spinal  canal would help with her neurogenic claudication symptoms from her spinal stenosis.  She would like to continue to work with physical therapy and will follow-up in approximately 2 months to evaluate whether or not she would like to go forward with a decompressive type surgery.  Thank you for involving me in the care of this patient.    Carroll Clamp MD/MSCR Neurosurgery   Spent a total of 30 minutes discussing her results, her recent physical therapy outcomes, her face-to-face evaluation, counseling of her current symptoms, and plan going forward.

## 2024-02-22 ENCOUNTER — Ambulatory Visit: Payer: Medicare PPO

## 2024-02-25 DIAGNOSIS — G8929 Other chronic pain: Secondary | ICD-10-CM | POA: Diagnosis not present

## 2024-02-25 DIAGNOSIS — R4189 Other symptoms and signs involving cognitive functions and awareness: Secondary | ICD-10-CM | POA: Diagnosis not present

## 2024-02-25 DIAGNOSIS — M5416 Radiculopathy, lumbar region: Secondary | ICD-10-CM | POA: Diagnosis not present

## 2024-02-25 DIAGNOSIS — M542 Cervicalgia: Secondary | ICD-10-CM | POA: Diagnosis not present

## 2024-02-25 DIAGNOSIS — L219 Seborrheic dermatitis, unspecified: Secondary | ICD-10-CM | POA: Diagnosis not present

## 2024-02-25 DIAGNOSIS — G20C Parkinsonism, unspecified: Secondary | ICD-10-CM | POA: Diagnosis not present

## 2024-02-25 DIAGNOSIS — F319 Bipolar disorder, unspecified: Secondary | ICD-10-CM | POA: Diagnosis not present

## 2024-02-25 DIAGNOSIS — I10 Essential (primary) hypertension: Secondary | ICD-10-CM | POA: Diagnosis not present

## 2024-02-25 DIAGNOSIS — G2401 Drug induced subacute dyskinesia: Secondary | ICD-10-CM | POA: Diagnosis not present

## 2024-02-29 ENCOUNTER — Inpatient Hospital Stay: Payer: Medicare PPO

## 2024-02-29 ENCOUNTER — Inpatient Hospital Stay: Payer: Medicare PPO | Attending: Radiation Oncology

## 2024-02-29 ENCOUNTER — Inpatient Hospital Stay (HOSPITAL_BASED_OUTPATIENT_CLINIC_OR_DEPARTMENT_OTHER): Payer: Medicare PPO | Admitting: Internal Medicine

## 2024-02-29 ENCOUNTER — Encounter: Payer: Self-pay | Admitting: Internal Medicine

## 2024-02-29 ENCOUNTER — Ambulatory Visit: Payer: Medicare PPO

## 2024-02-29 VITALS — BP 139/60 | HR 79 | Temp 98.5°F | Resp 16 | Ht 66.0 in | Wt 224.0 lb

## 2024-02-29 DIAGNOSIS — M549 Dorsalgia, unspecified: Secondary | ICD-10-CM | POA: Diagnosis not present

## 2024-02-29 DIAGNOSIS — Z801 Family history of malignant neoplasm of trachea, bronchus and lung: Secondary | ICD-10-CM | POA: Diagnosis not present

## 2024-02-29 DIAGNOSIS — Z90722 Acquired absence of ovaries, bilateral: Secondary | ICD-10-CM | POA: Insufficient documentation

## 2024-02-29 DIAGNOSIS — Z9089 Acquired absence of other organs: Secondary | ICD-10-CM | POA: Diagnosis not present

## 2024-02-29 DIAGNOSIS — I10 Essential (primary) hypertension: Secondary | ICD-10-CM | POA: Insufficient documentation

## 2024-02-29 DIAGNOSIS — Z803 Family history of malignant neoplasm of breast: Secondary | ICD-10-CM | POA: Insufficient documentation

## 2024-02-29 DIAGNOSIS — Z853 Personal history of malignant neoplasm of breast: Secondary | ICD-10-CM | POA: Diagnosis present

## 2024-02-29 DIAGNOSIS — M2550 Pain in unspecified joint: Secondary | ICD-10-CM | POA: Diagnosis not present

## 2024-02-29 DIAGNOSIS — Z79811 Long term (current) use of aromatase inhibitors: Secondary | ICD-10-CM | POA: Diagnosis not present

## 2024-02-29 DIAGNOSIS — Z8249 Family history of ischemic heart disease and other diseases of the circulatory system: Secondary | ICD-10-CM | POA: Insufficient documentation

## 2024-02-29 DIAGNOSIS — Z833 Family history of diabetes mellitus: Secondary | ICD-10-CM | POA: Diagnosis not present

## 2024-02-29 DIAGNOSIS — Z1732 Human epidermal growth factor receptor 2 negative status: Secondary | ICD-10-CM | POA: Insufficient documentation

## 2024-02-29 DIAGNOSIS — Z79899 Other long term (current) drug therapy: Secondary | ICD-10-CM | POA: Insufficient documentation

## 2024-02-29 DIAGNOSIS — M85851 Other specified disorders of bone density and structure, right thigh: Secondary | ICD-10-CM

## 2024-02-29 DIAGNOSIS — G20A1 Parkinson's disease without dyskinesia, without mention of fluctuations: Secondary | ICD-10-CM | POA: Insufficient documentation

## 2024-02-29 DIAGNOSIS — N6310 Unspecified lump in the right breast, unspecified quadrant: Secondary | ICD-10-CM | POA: Insufficient documentation

## 2024-02-29 DIAGNOSIS — E538 Deficiency of other specified B group vitamins: Secondary | ICD-10-CM | POA: Insufficient documentation

## 2024-02-29 DIAGNOSIS — G8929 Other chronic pain: Secondary | ICD-10-CM | POA: Insufficient documentation

## 2024-02-29 DIAGNOSIS — C50211 Malignant neoplasm of upper-inner quadrant of right female breast: Secondary | ICD-10-CM | POA: Insufficient documentation

## 2024-02-29 DIAGNOSIS — Z170 Estrogen receptor positive status [ER+]: Secondary | ICD-10-CM

## 2024-02-29 DIAGNOSIS — Z1721 Progesterone receptor positive status: Secondary | ICD-10-CM | POA: Insufficient documentation

## 2024-02-29 DIAGNOSIS — R5383 Other fatigue: Secondary | ICD-10-CM | POA: Diagnosis not present

## 2024-02-29 DIAGNOSIS — Z86018 Personal history of other benign neoplasm: Secondary | ICD-10-CM | POA: Diagnosis not present

## 2024-02-29 DIAGNOSIS — Z08 Encounter for follow-up examination after completed treatment for malignant neoplasm: Secondary | ICD-10-CM | POA: Diagnosis not present

## 2024-02-29 DIAGNOSIS — Z5181 Encounter for therapeutic drug level monitoring: Secondary | ICD-10-CM

## 2024-02-29 DIAGNOSIS — M8580 Other specified disorders of bone density and structure, unspecified site: Secondary | ICD-10-CM | POA: Insufficient documentation

## 2024-02-29 LAB — CMP (CANCER CENTER ONLY)
ALT: 26 U/L (ref 0–44)
AST: 28 U/L (ref 15–41)
Albumin: 3.8 g/dL (ref 3.5–5.0)
Alkaline Phosphatase: 34 U/L — ABNORMAL LOW (ref 38–126)
Anion gap: 11 (ref 5–15)
BUN: 6 mg/dL — ABNORMAL LOW (ref 8–23)
CO2: 24 mmol/L (ref 22–32)
Calcium: 9.1 mg/dL (ref 8.9–10.3)
Chloride: 99 mmol/L (ref 98–111)
Creatinine: 0.74 mg/dL (ref 0.44–1.00)
GFR, Estimated: >60 mL/min (ref >60)
Glucose, Bld: 127 mg/dL — ABNORMAL HIGH (ref 70–99)
Potassium: 3.6 mmol/L (ref 3.5–5.1)
Sodium: 134 mmol/L — ABNORMAL LOW (ref 135–145)
Total Bilirubin: 0.6 mg/dL (ref 0.0–1.2)
Total Protein: 6.2 g/dL — ABNORMAL LOW (ref 6.5–8.1)

## 2024-02-29 LAB — CBC WITH DIFFERENTIAL (CANCER CENTER ONLY)
Abs Immature Granulocytes: 0.04 10*3/uL (ref 0.00–0.07)
Basophils Absolute: 0.1 10*3/uL (ref 0.0–0.1)
Basophils Relative: 1 %
Eosinophils Absolute: 0.2 10*3/uL (ref 0.0–0.5)
Eosinophils Relative: 4 %
HCT: 41.5 % (ref 36.0–46.0)
Hemoglobin: 13.4 g/dL (ref 12.0–15.0)
Immature Granulocytes: 1 %
Lymphocytes Relative: 26 %
Lymphs Abs: 1.3 10*3/uL (ref 0.7–4.0)
MCH: 28.5 pg (ref 26.0–34.0)
MCHC: 32.3 g/dL (ref 30.0–36.0)
MCV: 88.3 fL (ref 80.0–100.0)
Monocytes Absolute: 0.5 10*3/uL (ref 0.1–1.0)
Monocytes Relative: 10 %
Neutro Abs: 3.1 10*3/uL (ref 1.7–7.7)
Neutrophils Relative %: 58 %
Platelet Count: 208 10*3/uL (ref 150–400)
RBC: 4.7 MIL/uL (ref 3.87–5.11)
RDW: 13.5 % (ref 11.5–15.5)
WBC Count: 5.2 10*3/uL (ref 4.0–10.5)
nRBC: 0 % (ref 0.0–0.2)

## 2024-02-29 MED ORDER — DENOSUMAB 60 MG/ML ~~LOC~~ SOSY
60.0000 mg | PREFILLED_SYRINGE | Freq: Once | SUBCUTANEOUS | Status: AC
  Administered 2024-02-29: 60 mg via SUBCUTANEOUS
  Filled 2024-02-29: qty 1

## 2024-02-29 NOTE — Progress Notes (Signed)
 [facility] CONSULT NOTE  Patient Care Team: David Escort Stan Eans, MD as PCP - General (Family Medicine) Glenis Langdon, MD as Referring Physician (Radiation Oncology) Annell Kidney, MD as Referring Physician (Ophthalmology) Willeen Harold, PT (Inactive) as Physical Therapist (Physical Therapy) Gwyn Leos, MD as Consulting Physician (Hematology and Oncology) Renaldo Caroli, MD as Consulting Physician (Pain Medicine)  CHIEF COMPLAINTS/PURPOSE OF CONSULTATION: Breast cancer.  Oncology History Overview Note  # multi-focal stage IA right breast cancer s/p right lumpectomy on 04/27/2018.  Pathology of the right lateral lesion revealed a 2.5 cm grade II invasive ductal carcinoma with intermediate grade DCIS.  The medial lesion revealed a 1.1 cm grade II invasive ductal carcinoma with intermediate grade DCIS. LVIDS was present.  Margins were negative.  Zero of 2 lymph nodes were positive. Both tumors were ER + (100%), PR + (70%), and HER-2 negative.  Ki-67 was 10%.  Pathologic stage was mT2N0. Oncotype DX revealed a recurrence score of 12 which translated to a distant recurrence of 3% at 9 years with an AI or tamxifen alone.  The absolute benefit of chemotherpay was < 1%. S/p breast radiation from 06/30/2018 - 07/20/2018.    She began Femara  on 09/06/2018.  stopped Femara  in dec 2024. .    # multilocular mucinous cystadenoma: s/p exploratory laparotomy, bilateral salpingo-oophrectomy, and pelvic washings on 02/10/2018. 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  Peritoneal washings revealed atypical cells.     Malignant neoplasm of upper-inner quadrant of right breast in female, estrogen receptor positive (HCC)  03/14/2018 Initial Diagnosis   Right breast mass noted on a CT scan, on mammogram 2 separate masses were noted.  2.2 cm mass at 11 o'clock position: IDC grade 2 with DCIS, ER 95%, PR 70%, Ki-67 10%,HER-2 negative ratio 1.3; second  mass 1.3 cm at 1 o'clock position: IDC grade 2, ER 100%, PR 70%, Ki-67 10%, HER-2 negative ratio 1.6, T2N0 stage Ib clinical stage AJCC 8   04/27/2018 Surgery   Right lumpectomy lateral: IDC grade 2, 2.5 cm, intermediate grade DCIS; right lumpectomy medial: IDC grade 2 1.1 cm, LVIDS present, intermediate grade DCIS, margins negative, 0/2 lymph nodes negative both tumors are ER PR positive HER-2 negative with a Ki-67 of 10%, T2N0 stage   05/06/2018 Cancer Staging   Staging form: Breast, AJCC 8th Edition - Pathologic: Stage IA (pT2(2), pN0, cM0, G2, ER+, PR+, HER2-) - Signed by Cameron [name], MD on 05/06/2018     HISTORY OF PRESENTING ILLNESS: Patient ambulating in wheel chair.   Accompanied by family.   Kendra [name] 78 y.o.  female patient with stage I breast cancer ER/PR positive HER2 negative s/p Femara  [dec 2024- stopped] on surveillance, ? Parkinson's disease chronic back pain-  is here for follow-up.  Patient continues to have issues with mobility/ back pain. Seeing Dr. Mason Sole on 03/06/24.   Patient also saw a neurosurgereon for her lack of mobility. She has also had a nerve block with [facility]. Pt is not wanting surgery just yet. She is seeing [facility] in July.    Denies any hot flashes.  Denies any falls.  Appetite is good.  No weight loss no nausea vomiting.  Patient stopped Femara  in dec 2024. Aaron Aas  Review of Systems  Constitutional:  Positive for malaise/fatigue. Negative for chills, diaphoresis, fever and weight loss.  HENT:  Negative for nosebleeds and sore throat.   Eyes:  Negative for double vision.  Respiratory:  Negative for cough, hemoptysis, sputum  production, shortness of breath and wheezing.   Cardiovascular:  Negative for chest pain, palpitations, orthopnea and leg swelling.  Gastrointestinal:  Negative for abdominal pain, blood in stool, constipation, diarrhea, heartburn, melena, nausea and vomiting.  Genitourinary:  Negative for dysuria, frequency and  urgency.  Musculoskeletal:  Positive for back pain and joint pain.  Skin: Negative.  Negative for itching and rash.  Neurological:  Negative for dizziness, tingling, focal weakness, weakness and headaches.  Endo/Heme/Allergies:  Does not bruise/bleed easily.  Psychiatric/Behavioral:  Negative for depression. The patient is not nervous/anxious and does not have insomnia.      MEDICAL HISTORY:  Past Medical History:  Diagnosis Date   Anxiety    Breast cancer (HCC) 03/2018   right breast cancer at 11:00 and 1:00   Bursitis    bilateral hips and knees   Complication of anesthesia    Depression    Family history of breast cancer 03/24/2018   Family history of lung cancer 03/24/2018   History of alcohol dependence (HCC) 2007   no ETOH; resolved since 2007   History of hiatal hernia    History of psychosis 2014   due to sleep disturbance   Hypertension    Personal history of radiation therapy 06/2018-07/2018   right breast ca   PONV (postoperative nausea and vomiting)     SURGICAL HISTORY: Past Surgical History:  Procedure Laterality Date   BREAST BIOPSY Right 03/14/2018   11:00 DCIS and invasive ductal carcinoma   BREAST BIOPSY Right 03/14/2018   1:00 Invasive ductal carcinoma   BREAST LUMPECTOMY Right 04/27/2018   lumpectomy of 11 and 1:00 cancers, clear margins, negative LN   BREAST LUMPECTOMY WITH RADIOACTIVE SEED AND SENTINEL LYMPH NODE BIOPSY Right 04/27/2018   Procedure: RIGHT BREAST LUMPECTOMY WITH RADIOACTIVE SEED X 2 AND RIGHT SENTINEL LYMPH NODE BIOPSY;  Surgeon: Ayesha Lente, MD;  Location: [facility];  Service: General;  Laterality: Right;   CATARACT EXTRACTION W/PHACO Left 08/30/2019   Procedure: CATARACT EXTRACTION PHACO AND INTRAOCULAR LENS PLACEMENT (IOC) LEFT panoptix toric  01:20.5  12.7%  10.26;  Surgeon: Annell Kidney, MD;  Location: [facility];  Service: Ophthalmology;  Laterality: Left;   CATARACT EXTRACTION W/PHACO Right  09/20/2019   Procedure: CATARACT EXTRACTION PHACO AND INTRAOCULAR LENS PLACEMENT (IOC) RIGHT 5.71  00:36.4  15.7%;  Surgeon: Annell Kidney, MD;  Location: [facility];  Service: Ophthalmology;  Laterality: Right;   LAPAROTOMY N/A 02/10/2018   Procedure: EXPLORATORY LAPAROTOMY;  Surgeon: Lyn Sanders, MD;  Location: [facility];  Service: Gynecology;  Laterality: N/A;   MASS EXCISION  01/2018   abdominal   OVARIAN CYST REMOVAL     SALPINGOOPHORECTOMY Bilateral 02/10/2018   Procedure: BILATERAL SALPINGO OOPHORECTOMY; PERITONEAL WASHINGS;  Surgeon: Lyn Sanders, MD;  Location: [facility];  Service: Gynecology;  Laterality: Bilateral;   TONSILLECTOMY     TONSILLECTOMY AND ADENOIDECTOMY  1953    SOCIAL HISTORY: Social History   Socioeconomic History   Marital status: Married    Spouse name: robert   Number of children: 0   Years of education: Not on file   Highest education level: Master's degree (e.g., MA, MS, MEng, MEd, MSW, MBA)  Occupational History   Occupation: retired Insurance underwriter of education  Tobacco Use   Smoking status: Never   Smokeless tobacco: Never  Vaping Use   Vaping status: Never Used  Substance and Sexual Activity   Alcohol use: Not Currently    Comment: alcohol  dependence prior to 2007   Drug use: Never   Sexual activity: Yes    Partners: Male    Birth control/protection: Surgical  Other Topics Concern   Not on file  Social History Narrative   Not on file   Social Drivers of Health   Financial Resource Strain: Low Risk  (11/24/2023)   Overall Financial Resource Strain (CARDIA)    Difficulty of Paying Living Expenses: Not hard at all  Food Insecurity: No Food Insecurity (11/24/2023)   Hunger Vital Sign    Worried About Running Out of Food in the Last Year: Never true    Ran Out of Food in the Last Year: Never true  Transportation Needs: No Transportation Needs (11/24/2023)   PRAPARE - Administrator, Civil Service  (Medical): No    Lack of Transportation (Non-Medical): No  Physical Activity: Unknown (11/24/2023)   Exercise Vital Sign    Days of Exercise per Week: 0 days    Minutes of Exercise per Session: Not on file  Stress: Stress Concern Present (11/24/2023)   [employer] of Occupational Health - Occupational Stress Questionnaire    Feeling of Stress : To some extent  Social Connections: Moderately Integrated (11/24/2023)   Social Connection and Isolation Panel [NHANES]    Frequency of Communication with Friends and Family: Once a week    Frequency of Social Gatherings with Friends and Family: More than three times a week    Attends Religious Services: More than 4 times per year    Active Member of [employer] or Organizations: No    Attends Banker Meetings: Never    Marital Status: Married  Recent Concern: Social Connections - Socially Isolated (11/18/2023)   Social Connection and Isolation Panel [NHANES]    Frequency of Communication with Friends and Family: Never    Frequency of Social Gatherings with Friends and Family: Once a week    Attends Religious Services: Never    Database administrator or Organizations: No    Attends Banker Meetings: Never    Marital Status: Married  Catering manager Violence: Not At Risk (11/18/2023)   Humiliation, Afraid, Rape, and Kick questionnaire    Fear of Current or Ex-Partner: No    Emotionally Abused: No    Physically Abused: No    Sexually Abused: No    FAMILY HISTORY: Family History  Problem Relation Age of Onset   Diabetes Mother    Breast cancer Mother 63   Hypertension Mother    Lung cancer Father        asbestos exposure   Heart disease Brother 65   Breast cancer Cousin 88       paternal cousin   Heart disease Paternal Grandmother 36   Heart disease Paternal Grandfather     ALLERGIES:  is allergic to hydroxyzine, other, and shrimp [shellfish allergy].  MEDICATIONS:  Current Outpatient Medications   Medication Sig Dispense Refill   Acetaminophen  500 MG capsule Take 1,000 mg by mouth daily as needed for pain.     AMBULATORY NON FORMULARY MEDICATION 1 Bag by Other route daily. HERO smart dispenser 1 Bag 0   cyanocobalamin  (VITAMIN B12) 1000 MCG/ML injection INJECT 1 ML INTO THE MUSCLE EVERY 30 DAYS 3 mL 3   denosumab  (PROLIA ) 60 MG/ML SOSY injection Inject 60 mg into the skin every 6 (six) months. takes annually     FLUoxetine  HCl 60 MG TABS Take 1 tablet by mouth daily. 90 tablet 3  gabapentin  (NEURONTIN ) 300 MG capsule TAKE 1 CAPSULE BY MOUTH 3 TIMES A DAY 90 capsule 1   losartan -hydrochlorothiazide  (HYZAAR) 50-12.5 MG tablet Take 1 tablet by mouth daily. 90 tablet 1   Multiple Vitamins-Minerals (VITAMIN D3 COMPLETE PO) Take 500 Units by mouth in the morning, at noon, and at bedtime.     nystatin  (MYCOSTATIN /NYSTOP ) powder APPLY TO AFFECTED AREA(S) THREE TIMES A DAY 60 g 0   risperiDONE  (RISPERDAL ) 0.25 MG tablet Take 3 tablets (0.75 mg total) by mouth as directed. Take 1 tablet daily morning and 2 tablets daily at bedtime (Patient taking differently: Take 0.5 mg by mouth daily. Take 1 tablet daily morning and 2 tablets daily at bedtime) 270 tablet 1   traZODone  (DESYREL ) 100 MG tablet Take 1 tablet (100 mg total) by mouth at bedtime. 90 tablet 3   No current facility-administered medications for this visit.      Aaron Aas  PHYSICAL EXAMINATION:  Vitals:   02/29/24 1333  BP: 139/60  Pulse: 79  Resp: 16  Temp: 98.5 F (36.9 C)  SpO2: 100%   Filed Weights   02/29/24 1333  Weight: 224 lb (101.6 kg)    Physical Exam Vitals and nursing note reviewed.  HENT:     Head: Normocephalic and atraumatic.     Mouth/Throat:     Pharynx: Oropharynx is clear.  Eyes:     Extraocular Movements: Extraocular movements intact.     Pupils: Pupils are equal, round, and reactive to light.  Cardiovascular:     Rate and Rhythm: Normal rate and regular rhythm.  Pulmonary:     Comments: Decreased  breath sounds bilaterally.  Abdominal:     Palpations: Abdomen is soft.  Musculoskeletal:        General: Normal range of motion.     Cervical back: Normal range of motion.  Skin:    General: Skin is warm.  Neurological:     General: No focal deficit present.     Mental Status: She is alert and oriented to person, place, and time.  Psychiatric:        Behavior: Behavior normal.        Judgment: Judgment normal.      LABORATORY DATA:  I have reviewed the data as listed Lab Results  Component Value Date   WBC 5.2 02/29/2024   HGB 13.4 02/29/2024   HCT 41.5 02/29/2024   MCV 88.3 02/29/2024   PLT 208 02/29/2024   Recent Labs    08/31/23 1244 11/05/23 0955 02/29/24 1336  NA 133* 134 134*  K 3.9 3.9 3.6  CL 99 97 99  CO2 23 21 24   GLUCOSE 151* 111* 127*  BUN 10 6* 6*  CREATININE 0.70 0.72 0.74  CALCIUM 9.3 9.2 9.1  GFRNONAA >60  --  >60  PROT 6.7 6.2 6.2*  ALBUMIN  4.1 4.4 3.8  AST 25 15 28   ALT 14 22 26   ALKPHOS 32* 66 34*  BILITOT 0.9 0.3 0.6    RADIOGRAPHIC STUDIES: I have personally reviewed the radiological images as listed and agreed with the findings in the report. No results found.  Malignant neoplasm of upper-inner quadrant of right breast in female, estrogen receptor positive (HCC) # multi-focal stage IA right breast cancer s/p right lumpectomy on 04/27/2018.  ER + (100%), PR + (70%), and HER-2 negative.  Ki-67 was 10%.  Pathologic stage was mT2N0. Oncotype DX revealed a recurrence score of 12 which translated to a distant recurrence of 3% at 9 years  with an AI or tamxifen alone.  No chemotherapy given.  S/p breast radiation from 06/30/2018 - 07/20/2018.  She began Femara  on 09/06/2018- s/p Femara  [dec 2024- stoppped] on surveillance- AUG 2024- Mammo BIL- WNL. Stable.     #B12 deficiency [hx of perncious abemia]- Patient's cousin gives her B12 injections monthly.             # Osteopenia- AUG 2023- T-score of -1.6- on Prolia  SQ- [since April 2023] Overall  stable;  proceed with prolia  today. Consider reclast at next visit based on next BMD in spring 2026.   #  ? Parkinson- /trermors/ gait sintabiliy- sinemet  - stable.    # DISPOSITION: # prolia  today # Mammogram BIL in AUG 2025 #  RTC in 12 months for MD assessment, labs (CBC with diff, CMP, CA 27-29,  vit D 25OH) and possible Reclast; BMD prior--Dr.B     All questions were answered. The patient knows to call the clinic with any problems, questions or concerns.     Gwyn Leos, MD 02/29/2024 3:58 PM

## 2024-02-29 NOTE — Assessment & Plan Note (Signed)
#   multi-focal stage IA right breast cancer s/p right lumpectomy on 04/27/2018.  ER + (100%), PR + (70%), and HER-2 negative.  Ki-67 was 10%.  Pathologic stage was mT2N0. Oncotype DX revealed a recurrence score of 12 which translated to a distant recurrence of 3% at 9 years with an AI or tamxifen alone.  No chemotherapy given.  S/p breast radiation from 06/30/2018 - 07/20/2018.  She began Femara  on 09/06/2018- s/p Femara  [dec 2024- stoppped] on surveillance- AUG 2024- Mammo BIL- WNL. Stable.     #B12 deficiency [hx of perncious abemia]- Patient's cousin gives her B12 injections monthly.             # Osteopenia- AUG 2023- T-score of -1.6- on Prolia  SQ- [since April 2023] Overall stable;  proceed with prolia  today. Consider reclast at next visit based on next BMD in spring 2026.   #  ? Parkinson- /trermors/ gait sintabiliy- sinemet  - stable.    # DISPOSITION: # prolia  today # Mammogram BIL in AUG 2025 #  RTC in 12 months for MD assessment, labs (CBC with diff, CMP, CA 27-29,  vit D 25OH) and possible Reclast; BMD prior--Dr.B

## 2024-02-29 NOTE — Progress Notes (Signed)
 C/o pt's mobility. Seeing Dr. Mason Sole on 03/06/24. She saw a neurosurgereon for her lack of mobility. She has also had a nerve block with [facility]. Pt is not wanting surgery just yet. She is seeing [facility] in July.

## 2024-03-01 LAB — CA 125: Cancer Antigen (CA) 125: 10 U/mL (ref 0.0–38.1)

## 2024-03-01 LAB — CANCER ANTIGEN 27.29: CA 27.29: 25.4 U/mL (ref 0.0–38.6)

## 2024-03-06 DIAGNOSIS — G20C Parkinsonism, unspecified: Secondary | ICD-10-CM | POA: Diagnosis not present

## 2024-03-06 DIAGNOSIS — I10 Essential (primary) hypertension: Secondary | ICD-10-CM | POA: Diagnosis not present

## 2024-03-06 DIAGNOSIS — F319 Bipolar disorder, unspecified: Secondary | ICD-10-CM | POA: Diagnosis not present

## 2024-03-06 DIAGNOSIS — M542 Cervicalgia: Secondary | ICD-10-CM | POA: Diagnosis not present

## 2024-03-06 DIAGNOSIS — G8929 Other chronic pain: Secondary | ICD-10-CM | POA: Diagnosis not present

## 2024-03-06 DIAGNOSIS — M5416 Radiculopathy, lumbar region: Secondary | ICD-10-CM | POA: Diagnosis not present

## 2024-03-06 DIAGNOSIS — G2401 Drug induced subacute dyskinesia: Secondary | ICD-10-CM | POA: Diagnosis not present

## 2024-03-06 DIAGNOSIS — L219 Seborrheic dermatitis, unspecified: Secondary | ICD-10-CM | POA: Diagnosis not present

## 2024-03-06 DIAGNOSIS — R4189 Other symptoms and signs involving cognitive functions and awareness: Secondary | ICD-10-CM | POA: Diagnosis not present

## 2024-03-09 DIAGNOSIS — M5416 Radiculopathy, lumbar region: Secondary | ICD-10-CM | POA: Diagnosis not present

## 2024-03-09 DIAGNOSIS — G20C Parkinsonism, unspecified: Secondary | ICD-10-CM | POA: Diagnosis not present

## 2024-03-09 DIAGNOSIS — R4189 Other symptoms and signs involving cognitive functions and awareness: Secondary | ICD-10-CM | POA: Diagnosis not present

## 2024-03-09 DIAGNOSIS — I10 Essential (primary) hypertension: Secondary | ICD-10-CM | POA: Diagnosis not present

## 2024-03-09 DIAGNOSIS — G8929 Other chronic pain: Secondary | ICD-10-CM | POA: Diagnosis not present

## 2024-03-09 DIAGNOSIS — L219 Seborrheic dermatitis, unspecified: Secondary | ICD-10-CM | POA: Diagnosis not present

## 2024-03-09 DIAGNOSIS — M542 Cervicalgia: Secondary | ICD-10-CM | POA: Diagnosis not present

## 2024-03-09 DIAGNOSIS — F319 Bipolar disorder, unspecified: Secondary | ICD-10-CM | POA: Diagnosis not present

## 2024-03-09 DIAGNOSIS — G2401 Drug induced subacute dyskinesia: Secondary | ICD-10-CM | POA: Diagnosis not present

## 2024-03-15 ENCOUNTER — Other Ambulatory Visit: Payer: Self-pay | Admitting: Internal Medicine

## 2024-03-17 DIAGNOSIS — M5416 Radiculopathy, lumbar region: Secondary | ICD-10-CM | POA: Diagnosis not present

## 2024-03-17 DIAGNOSIS — F319 Bipolar disorder, unspecified: Secondary | ICD-10-CM | POA: Diagnosis not present

## 2024-03-17 DIAGNOSIS — G20C Parkinsonism, unspecified: Secondary | ICD-10-CM | POA: Diagnosis not present

## 2024-03-17 DIAGNOSIS — G8929 Other chronic pain: Secondary | ICD-10-CM | POA: Diagnosis not present

## 2024-03-17 DIAGNOSIS — I10 Essential (primary) hypertension: Secondary | ICD-10-CM | POA: Diagnosis not present

## 2024-03-17 DIAGNOSIS — R4189 Other symptoms and signs involving cognitive functions and awareness: Secondary | ICD-10-CM | POA: Diagnosis not present

## 2024-03-17 DIAGNOSIS — L219 Seborrheic dermatitis, unspecified: Secondary | ICD-10-CM | POA: Diagnosis not present

## 2024-03-17 DIAGNOSIS — G2401 Drug induced subacute dyskinesia: Secondary | ICD-10-CM | POA: Diagnosis not present

## 2024-03-17 DIAGNOSIS — M542 Cervicalgia: Secondary | ICD-10-CM | POA: Diagnosis not present

## 2024-03-20 ENCOUNTER — Encounter: Payer: Self-pay | Admitting: Psychiatry

## 2024-03-20 ENCOUNTER — Telehealth: Admitting: Psychiatry

## 2024-03-20 DIAGNOSIS — F411 Generalized anxiety disorder: Secondary | ICD-10-CM | POA: Diagnosis not present

## 2024-03-20 DIAGNOSIS — F3176 Bipolar disorder, in full remission, most recent episode depressed: Secondary | ICD-10-CM

## 2024-03-20 DIAGNOSIS — G2401 Drug induced subacute dyskinesia: Secondary | ICD-10-CM | POA: Diagnosis not present

## 2024-03-20 DIAGNOSIS — F5105 Insomnia due to other mental disorder: Secondary | ICD-10-CM | POA: Diagnosis not present

## 2024-03-20 MED ORDER — RISPERIDONE 0.25 MG PO TABS: 0.2500 mg | ORAL_TABLET | Freq: Two times a day (BID) | ORAL | Status: DC

## 2024-03-20 NOTE — Progress Notes (Signed)
 Virtual Visit via Video Note  I connected with Kendra [name] on 03/20/24 at  3:00 PM EDT by a video enabled telemedicine application and verified that I am speaking with the correct person using two identifiers.  Location Provider Location : [facility] Patient Location : Home  Participants: Patient ,Spouse provider   I discussed the limitations of evaluation and management by telemedicine and the availability of in person appointments. The patient expressed understanding and agreed to proceed.   I discussed the assessment and treatment plan with the patient. The patient was provided an opportunity to ask questions and all were answered. The patient agreed with the plan and demonstrated an understanding of the instructions.   The patient was advised to call back or seek an in-person evaluation if the symptoms worsen or if the condition fails to improve as anticipated.   BH MD OP Progress Note  03/21/2024 9:57 AM Nitzia Perren  MRN:  [id number]  Chief Complaint:  Chief Complaint  Patient presents with   Follow-up   Anxiety   Depression   Medication Refill   Discussed the use of AI scribe software for clinical note transcription with the patient, who gave verbal consent to proceed.  History of Present Illness Kendra [name] is a 78 year old Caucasian female, married, retired, lives in [place], has a history of bipolar disorder, GAD, insomnia, hyperprolactinemia, history of multifocal stage II breast cancer status post lumpectomy, ovarian cyst removal, bilateral hearing loss was evaluated by telemedicine today. She is accompanied by her husband, Porfirio Bristol.  She has been attending physical therapy sessions once a week on Fridays and performs exercises at home three days a week with the assistance of Bynum Cassis, who visits on Tuesdays, Thursdays, and Fridays. She uses a walker to aid her balance when moving around.  Her risperidone  dosage was reduced to 0.25 mg in the morning and 0.5 mg at  night since her last visit on January 24, 2024. She has not noticed any changes in her mood since the dosage reduction. Porfirio Bristol notes that her tardive dyskinesia symptoms, particularly movements around her mouth, seem to have decreased, although her foot movements remain unchanged.  She takes trazodone  for sleep and prefers sleeping in a recliner as it facilitates easier access to the bathroom. She reports sleeping well and waking up rested.  She is currently on gabapentin  300 mg three times a day, prescribed for pain which also helps with  mood stabilization, fluoxetine  for anxiety and depression, and trazodone  for sleep.  Denies thoughts of self-harm or suicidal ideation.   She appeared to be alert, oriented to person place time situation.  3 word memory immediate 3 out of 3 after 5 minutes 2 out of 3.  She was able to do serial threes, attention and focus seems to be good.  She is scheduled to see a movement disorder specialist at [facility] in July to further evaluate her movement symptoms.  Collateral information obtained from spouse who reports improvement of her movement problems since reducing the risperidone  dosage and depression symptoms/mood swings are stable.  Visit Diagnosis:    ICD-10-CM   1. Bipolar disorder, in full remission, most recent episode depressed (HCC)  F31.76 risperiDONE  (RISPERDAL ) 0.25 MG tablet    2. GAD (generalized anxiety disorder)  F41.1     3. Insomnia due to mental condition  F51.05    mood, pain    4. Tardive dyskinesia  G24.01       Past Psychiatric History: I have reviewed past psychiatric history  from progress note on 03/01/2019.  Past trials of risperidone , Prozac , Seroquel , Ativan .  Past Medical History:  Past Medical History:  Diagnosis Date   Anxiety    Breast cancer (HCC) 03/2018   right breast cancer at 11:00 and 1:00   Bursitis    bilateral hips and knees   Complication of anesthesia    Depression    Family history of breast cancer  03/24/2018   Family history of lung cancer 03/24/2018   History of alcohol dependence (HCC) 2007   no ETOH; resolved since 2007   History of hiatal hernia    History of psychosis 2014   due to sleep disturbance   Hypertension    Personal history of radiation therapy 06/2018-07/2018   right breast ca   PONV (postoperative nausea and vomiting)     Past Surgical History:  Procedure Laterality Date   BREAST BIOPSY Right 03/14/2018   11:00 DCIS and invasive ductal carcinoma   BREAST BIOPSY Right 03/14/2018   1:00 Invasive ductal carcinoma   BREAST LUMPECTOMY Right 04/27/2018   lumpectomy of 11 and 1:00 cancers, clear margins, negative LN   BREAST LUMPECTOMY WITH RADIOACTIVE SEED AND SENTINEL LYMPH NODE BIOPSY Right 04/27/2018   Procedure: RIGHT BREAST LUMPECTOMY WITH RADIOACTIVE SEED X 2 AND RIGHT SENTINEL LYMPH NODE BIOPSY;  Surgeon: Ayesha Lente, MD;  Location: [facility];  Service: General;  Laterality: Right;   CATARACT EXTRACTION W/PHACO Left 08/30/2019   Procedure: CATARACT EXTRACTION PHACO AND INTRAOCULAR LENS PLACEMENT (IOC) LEFT panoptix toric  01:20.5  12.7%  10.26;  Surgeon: Annell Kidney, MD;  Location: [facility];  Service: Ophthalmology;  Laterality: Left;   CATARACT EXTRACTION W/PHACO Right 09/20/2019   Procedure: CATARACT EXTRACTION PHACO AND INTRAOCULAR LENS PLACEMENT (IOC) RIGHT 5.71  00:36.4  15.7%;  Surgeon: Annell Kidney, MD;  Location: [facility];  Service: Ophthalmology;  Laterality: Right;   LAPAROTOMY N/A 02/10/2018   Procedure: EXPLORATORY LAPAROTOMY;  Surgeon: Lyn Sanders, MD;  Location: [facility];  Service: Gynecology;  Laterality: N/A;   MASS EXCISION  01/2018   abdominal   OVARIAN CYST REMOVAL     SALPINGOOPHORECTOMY Bilateral 02/10/2018   Procedure: BILATERAL SALPINGO OOPHORECTOMY; PERITONEAL WASHINGS;  Surgeon: Lyn Sanders, MD;  Location: [facility];  Service: Gynecology;  Laterality: Bilateral;    TONSILLECTOMY     TONSILLECTOMY AND ADENOIDECTOMY  1953    Family Psychiatric History: I have reviewed family psychiatric history from progress note on 03/01/2019.  Family History:  Family History  Problem Relation Age of Onset   Diabetes Mother    Breast cancer Mother 27   Hypertension Mother    Lung cancer Father        asbestos exposure   Heart disease Brother 29   Breast cancer Cousin 5       paternal cousin   Heart disease Paternal Grandmother 24   Heart disease Paternal Grandfather     Social History: I have reviewed social history from progress note on 03/01/2019. Social History   Socioeconomic History   Marital status: Married    Spouse name: robert   Number of children: 0   Years of education: Not on file   Highest education level: Master's degree (e.g., MA, MS, MEng, MEd, MSW, MBA)  Occupational History   Occupation: retired Insurance underwriter of education  Tobacco Use   Smoking status: Never   Smokeless tobacco: Never  Vaping Use   Vaping status: Never Used  Substance and Sexual  Activity   Alcohol use: Not Currently    Comment: alcohol dependence prior to 2007   Drug use: Never   Sexual activity: Yes    Partners: Male    Birth control/protection: Surgical  Other Topics Concern   Not on file  Social History Narrative   Not on file   Social Drivers of Health   Financial Resource Strain: Low Risk  (11/24/2023)   Overall Financial Resource Strain (CARDIA)    Difficulty of Paying Living Expenses: Not hard at all  Food Insecurity: No Food Insecurity (11/24/2023)   Hunger Vital Sign    Worried About Running Out of Food in the Last Year: Never true    Ran Out of Food in the Last Year: Never true  Transportation Needs: No Transportation Needs (11/24/2023)   PRAPARE - Administrator, Civil Service (Medical): No    Lack of Transportation (Non-Medical): No  Physical Activity: Unknown (11/24/2023)   Exercise Vital Sign    Days of Exercise  per Week: 0 days    Minutes of Exercise per Session: Not on file  Stress: Stress Concern Present (11/24/2023)   [employer] of Occupational Health - Occupational Stress Questionnaire    Feeling of Stress : To some extent  Social Connections: Moderately Integrated (11/24/2023)   Social Connection and Isolation Panel [NHANES]    Frequency of Communication with Friends and Family: Once a week    Frequency of Social Gatherings with Friends and Family: More than three times a week    Attends Religious Services: More than 4 times per year    Active Member of [employer] or Organizations: No    Attends Banker Meetings: Never    Marital Status: Married  Recent Concern: Social Connections - Socially Isolated (11/18/2023)   Social Connection and Isolation Panel [NHANES]    Frequency of Communication with Friends and Family: Never    Frequency of Social Gatherings with Friends and Family: Once a week    Attends Religious Services: Never    Database administrator or Organizations: No    Attends Banker Meetings: Never    Marital Status: Married    Allergies:  Allergies  Allergen Reactions   Hydroxyzine Anaphylaxis    Tongue swollen   Other Other (See Comments)    Food poisoning    Shrimp [Shellfish Allergy] Other (See Comments)    Food poisoning     Metabolic Disorder Labs: Lab Results  Component Value Date   HGBA1C 5.9 (H) 11/05/2023   Lab Results  Component Value Date   PROLACTIN 78.3 (H) 01/04/2023   PROLACTIN 70.5 (H) 05/12/2022   Lab Results  Component Value Date   CHOL 171 11/05/2023   TRIG 115 11/05/2023   HDL 57 11/05/2023   CHOLHDL 3.0 11/05/2023   LDLCALC 93 11/05/2023   LDLCALC 113 (H) 11/06/2022   Lab Results  Component Value Date   TSH 1.653 01/04/2023   TSH 2.31 04/13/2019    Therapeutic Level Labs: No results found for: "LITHIUM" No results found for: "VALPROATE" No results found for: "CBMZ"  Current Medications: Current  Outpatient Medications  Medication Sig Dispense Refill   Acetaminophen  500 MG capsule Take 1,000 mg by mouth daily as needed for pain.     AMBULATORY NON FORMULARY MEDICATION 1 Bag by Other route daily. HERO smart dispenser 1 Bag 0   cyanocobalamin  (VITAMIN B12) 1000 MCG/ML injection INJECT 1 ML INTO THE MUSCLE EVERY 30 DAYS 3 mL 3  denosumab  (PROLIA ) 60 MG/ML SOSY injection Inject 60 mg into the skin every 6 (six) months. takes annually     FLUoxetine  HCl 60 MG TABS Take 1 tablet by mouth daily. 90 tablet 3   gabapentin  (NEURONTIN ) 300 MG capsule TAKE 1 CAPSULE BY MOUTH 3 TIMES A DAY 90 capsule 1   losartan -hydrochlorothiazide  (HYZAAR) 50-12.5 MG tablet Take 1 tablet by mouth daily. 90 tablet 1   Multiple Vitamins-Minerals (VITAMIN D3 COMPLETE PO) Take 500 Units by mouth in the morning, at noon, and at bedtime.     nystatin  (MYCOSTATIN /NYSTOP ) powder APPLY TO AFFECTED AREA(S) 3 TIMES A DAY 60 g 0   risperiDONE  (RISPERDAL ) 0.25 MG tablet Take 1 tablet (0.25 mg total) by mouth 2 (two) times daily.     traZODone  (DESYREL ) 100 MG tablet Take 1 tablet (100 mg total) by mouth at bedtime. 90 tablet 3   No current facility-administered medications for this visit.     Musculoskeletal: Strength & Muscle Tone: UTA Gait & Station: Seated Patient leans: N/A  Psychiatric Specialty Exam: Review of Systems  Psychiatric/Behavioral: Negative.      There were no vitals taken for this visit.There is no height or weight on file to calculate BMI.  General Appearance: Casual  Eye Contact:  Fair  Speech:  Clear and Coherent  Volume:  Normal  Mood:  Euthymic  Affect:  Flat  Thought Process:  Goal Directed and Descriptions of Associations: Intact  Orientation:  Full (Time, Place, and Person)  Thought Content: Logical   Suicidal Thoughts:  No  Homicidal Thoughts:  No  Memory:  Immediate;   Fair Recent;   Fair Remote;   Limited  Judgement:  Fair  Insight:  Fair  Psychomotor Activity:  Normal   Concentration:  Concentration: Fair and Attention Span: Fair  Recall:  [employer] of Knowledge: Fair  Language: Fair  Akathisia:  No  Handed:  Right  AIMS (if indicated): not done  Assets:  Desire for Improvement Housing Social Support Transportation  ADL's:  Intact  Cognition: WNL  Sleep:  Fair   Screenings: Midwife Visit from 01/24/2024 in [facility] Office Visit from 08/10/2023 in [facility] Office Visit from 04/08/2023 in [facility] Office Visit from 09/04/2022 in [facility] Office Visit from 06/04/2022 in [facility]  AIMS Total Score 1 0 0 0 0      GAD-7    Flowsheet Row Office Visit from 01/24/2024 in [facility] Office Visit from 11/29/2023 in [facility] Office Visit from 11/05/2023 in [facility] Office Visit from 08/10/2023 in [facility] Counselor from 03/25/2021 in [facility]  Total GAD-7 Score 1 8 12 9 4       PHQ2-9    Flowsheet Row Office Visit from 01/24/2024 in [facility] Office Visit from 11/29/2023 in [facility] Office Visit from 11/22/2023 [facility] Visit from 11/05/2023 in [facility] Office Visit from 08/10/2023 in [facility]  PHQ-2 Total Score 0 1 1 6 2   PHQ-9 Total Score 7 10 -- 18 17      Flowsheet Row Video Visit from 03/20/2024 in [facility] Office Visit from 01/24/2024 in [facility] Video Visit from 09/09/2023 in [facility]  C-SSRS RISK CATEGORY No Risk No Risk No Risk        Assessment and Plan: Kendra [name] is a 78 year old Caucasian female, lives in [place], has a history of bipolar disorder, generalized anxiety disorder was evaluated by telemedicine today.  Discussed assessment and plan as noted below.  Bipolar disorder type I in remission Generalized anxiety disorder-stable Currently mood symptoms continues to be stable on the combination of gabapentin , fluoxetine , risperidone , trazodone .  Denies any changes in her mood since reducing the dosage of risperidone .  Parkinsonian symptoms may have improved since reducing the risperidone  per spouse report. - Reduce Risperidone  further to 0.25 mg twice daily. - Monitor for any mood changes. - Continue Prozac  60 mg daily - Continue Gabapentin  300 mg 3 times a day prescribed by neurology.  Insomnia/Snoring-stable - Currently reports sleep is overall good as long as she sleeps on a recliner. - Continue Trazodone  100 mg at bedtime.  Collateral information obtained from spouse who notes patient has doing fairly well.  Follow-up Follow-up in clinic in 6 to 7 weeks or sooner if needed   Consent: Patient/Guardian gives verbal consent for treatment and assignment of benefits for services provided during this visit. Patient/Guardian expressed understanding and agreed to proceed.   This note was generated in part or whole with voice recognition software. Voice recognition is usually quite accurate but there are transcription errors that can and very often do occur. I apologize for any typographical errors that were not detected and corrected.    Chiyo Fay, MD 03/21/2024, 9:57 AM

## 2024-03-22 ENCOUNTER — Telehealth: Payer: Self-pay

## 2024-03-22 NOTE — Telephone Encounter (Signed)
 I did not send a new supply of risperidone  to the pharmacy since they had  previous supplies left. The dose was just reduced to risperidone  0.25 mg twice daily and patient was supposed to use the supplies that she has from previous prescription. If they need a new prescription sent for risperidone  0.25 mg twice daily I can go ahead and send it.  The reason the new dosage was not sent over to the pharmacy was because they had supplies pending from the previous risperidone  prescription.  That is why you do not see the new prescriptions send out to the pharmacy with the new dosage change.  Please let me know.

## 2024-03-22 NOTE — Telephone Encounter (Signed)
 Patients husband called stating that he can not cut the risperidone  in half and and the insurance will not pay I called the pharmacy and spoke to susan she stated that the SIG on the risperidone  is take one tablet in the morning and 2 tablets at bedtime this is not what I see in her chart please advise

## 2024-03-24 DIAGNOSIS — G2401 Drug induced subacute dyskinesia: Secondary | ICD-10-CM | POA: Diagnosis not present

## 2024-03-24 DIAGNOSIS — R4189 Other symptoms and signs involving cognitive functions and awareness: Secondary | ICD-10-CM | POA: Diagnosis not present

## 2024-03-24 DIAGNOSIS — G8929 Other chronic pain: Secondary | ICD-10-CM | POA: Diagnosis not present

## 2024-03-24 DIAGNOSIS — M542 Cervicalgia: Secondary | ICD-10-CM | POA: Diagnosis not present

## 2024-03-24 DIAGNOSIS — M5416 Radiculopathy, lumbar region: Secondary | ICD-10-CM | POA: Diagnosis not present

## 2024-03-24 DIAGNOSIS — I10 Essential (primary) hypertension: Secondary | ICD-10-CM | POA: Diagnosis not present

## 2024-03-24 DIAGNOSIS — F319 Bipolar disorder, unspecified: Secondary | ICD-10-CM | POA: Diagnosis not present

## 2024-03-24 DIAGNOSIS — L219 Seborrheic dermatitis, unspecified: Secondary | ICD-10-CM | POA: Diagnosis not present

## 2024-03-24 DIAGNOSIS — G20C Parkinsonism, unspecified: Secondary | ICD-10-CM | POA: Diagnosis not present

## 2024-03-28 NOTE — Telephone Encounter (Signed)
 Spoke to spouse he voiced understanding and stated that he did receive the  Risperidone  .25mg   he also mentioned and wanted your opinion Dr Mason Sole prescribed patient Gabapentin  300 mg 3 times a day he would like to know is this safe and should she continue taking that much please advise

## 2024-03-28 NOTE — Telephone Encounter (Signed)
 As long as gabapentin  is not causing any significant side effects like memory changes, drowsiness, slowing down or any other side effects it is okay to continue.  Otherwise could reduce the dosage to once or twice a day initially and gradually increase it depending upon how she tolerates it.  I will recommend discussing this with neurology-Dr. Mason Sole as well.

## 2024-03-28 NOTE — Telephone Encounter (Signed)
 I have made several attempts to reach out to the patient and or spouse no answer they both have the same number listed in the contact information

## 2024-03-28 NOTE — Telephone Encounter (Signed)
 Please try again later on today and let me know . Usually they respond to calls.

## 2024-03-28 NOTE — Telephone Encounter (Signed)
 Called to discuss message no answer left voicemail and my direct number to call me back with any questions or concerns

## 2024-03-31 ENCOUNTER — Encounter: Payer: Self-pay | Admitting: Internal Medicine

## 2024-03-31 DIAGNOSIS — M542 Cervicalgia: Secondary | ICD-10-CM | POA: Diagnosis not present

## 2024-03-31 DIAGNOSIS — G8929 Other chronic pain: Secondary | ICD-10-CM | POA: Diagnosis not present

## 2024-03-31 DIAGNOSIS — G2401 Drug induced subacute dyskinesia: Secondary | ICD-10-CM | POA: Diagnosis not present

## 2024-03-31 DIAGNOSIS — R4189 Other symptoms and signs involving cognitive functions and awareness: Secondary | ICD-10-CM | POA: Diagnosis not present

## 2024-03-31 DIAGNOSIS — I10 Essential (primary) hypertension: Secondary | ICD-10-CM | POA: Diagnosis not present

## 2024-03-31 DIAGNOSIS — F319 Bipolar disorder, unspecified: Secondary | ICD-10-CM | POA: Diagnosis not present

## 2024-03-31 DIAGNOSIS — M5416 Radiculopathy, lumbar region: Secondary | ICD-10-CM | POA: Diagnosis not present

## 2024-03-31 DIAGNOSIS — L219 Seborrheic dermatitis, unspecified: Secondary | ICD-10-CM | POA: Diagnosis not present

## 2024-03-31 DIAGNOSIS — G20C Parkinsonism, unspecified: Secondary | ICD-10-CM | POA: Diagnosis not present

## 2024-03-31 NOTE — Progress Notes (Signed)
 Documented the injection in the Lab encounter.

## 2024-04-01 ENCOUNTER — Other Ambulatory Visit: Payer: Self-pay | Admitting: Family Medicine

## 2024-04-03 DIAGNOSIS — L219 Seborrheic dermatitis, unspecified: Secondary | ICD-10-CM | POA: Diagnosis not present

## 2024-04-03 DIAGNOSIS — M542 Cervicalgia: Secondary | ICD-10-CM | POA: Diagnosis not present

## 2024-04-03 DIAGNOSIS — G2401 Drug induced subacute dyskinesia: Secondary | ICD-10-CM | POA: Diagnosis not present

## 2024-04-03 DIAGNOSIS — I10 Essential (primary) hypertension: Secondary | ICD-10-CM | POA: Diagnosis not present

## 2024-04-03 DIAGNOSIS — G20C Parkinsonism, unspecified: Secondary | ICD-10-CM | POA: Diagnosis not present

## 2024-04-03 DIAGNOSIS — M5416 Radiculopathy, lumbar region: Secondary | ICD-10-CM | POA: Diagnosis not present

## 2024-04-03 DIAGNOSIS — F319 Bipolar disorder, unspecified: Secondary | ICD-10-CM | POA: Diagnosis not present

## 2024-04-03 DIAGNOSIS — R4189 Other symptoms and signs involving cognitive functions and awareness: Secondary | ICD-10-CM | POA: Diagnosis not present

## 2024-04-03 DIAGNOSIS — G8929 Other chronic pain: Secondary | ICD-10-CM | POA: Diagnosis not present

## 2024-04-03 NOTE — Telephone Encounter (Signed)
 2nd request- [facility] faxed refill request for the following medications:   cyanocobalamin  (VITAMIN B12) 1000 MCG/ML injection    Please advise

## 2024-04-03 NOTE — Telephone Encounter (Signed)
 Requested medication (s) are due for refill today: yes  Requested medication (s) are on the active medication list: yes  Last refill:  04/05/23  Future visit scheduled: yes  Notes to clinic:  Unable to refill per protocol due to failed labs, no updated results.      Requested Prescriptions  Pending Prescriptions Disp Refills   cyanocobalamin  (VITAMIN B12) 1000 MCG/ML injection [Pharmacy Med Name: CYANOCOBALAMIN  1,000 MCG/ML MDV] 3 mL 3    Sig: INJECT INTO THE MUSCLE EVERY 30 DAYS     Endocrinology:  Vitamins - Vitamin B12 Failed - 04/03/2024 12:44 PM      Failed - B12 Level in normal range and within 360 days    Vitamin B-12  Date Value Ref Range Status  12/22/2021 607 232 - 1,245 pg/mL Final         Failed - Valid encounter within last 12 months    Recent Outpatient Visits   None            Passed - HCT in normal range and within 360 days    HCT  Date Value Ref Range Status  02/29/2024 41.5 36.0 - 46.0 % Final         Passed - HGB in normal range and within 360 days    Hemoglobin  Date Value Ref Range Status  02/29/2024 13.4 12.0 - 15.0 g/dL Final

## 2024-04-04 ENCOUNTER — Telehealth: Payer: Self-pay

## 2024-04-04 NOTE — Telephone Encounter (Signed)
 Copied from CRM [id number]. Topic: Clinical - Medication Question >> Apr 04, 2024  2:02 PM Carlatta H wrote: Reason for CRM: Please call the patients husband//He has some questions regarding medications//He would like to know more about his wifes gabapentin  (NEURONTIN ) 300 MG capsule [[id number] prescription//

## 2024-04-04 NOTE — Telephone Encounter (Signed)
 Spoke with patient husband, had some questions into when and why patient dosage increased. Gave him that dosage changed started in January, [name] understood. He noticed pt is taking too much Gabapentin  and states Kendra [name] is now having little pain. His questions is does pt still need to continue taking 300mg  TID or what recommendations you had regarding gabapentin , since she has little pain. Advised husband Dr. B is out for vacation and we could route message to a provider in office, husband stated he was ok waiting for Dr. B to return since she is the one involved in her care. He would love a call back, I stated I will be routing message to Dr. David Escort and her nurse so they can reach out to pt next week when she is back in office.

## 2024-04-11 NOTE — Telephone Encounter (Signed)
 The 300mg  TID is what we have had her on in our chart.  If she is having side effects or sedation, we can decrease the dose to 100mg  pills - take 100mg  PO TID.  Ok to send new rx #90 r3.  She may not be having much pain though because of the gabapentin , so we could see an increase in pain with a decreased dose. Just to be aware.

## 2024-04-11 NOTE — Telephone Encounter (Signed)
 Called and spoke to the pt husband to relay the message per Dr B, he wants to know instead of taking 300mg  TID can she take 300mg  PO BID? Please advise

## 2024-04-12 ENCOUNTER — Other Ambulatory Visit: Payer: Self-pay | Admitting: Family Medicine

## 2024-04-12 MED ORDER — GABAPENTIN 100 MG PO CAPS: 100.0000 mg | ORAL_CAPSULE | Freq: Three times a day (TID) | ORAL | 3 refills | Status: DC

## 2024-04-12 NOTE — Telephone Encounter (Signed)
 Copied from CRM [id number]. Topic: Clinical - Prescription Issue >> Apr 12, 2024  9:13 AM Crispin Dolphin wrote: Reason for CRM: Patient husband called. States provider requested decreasing gabapentin  (NEURONTIN ) to 100. States pharmacy needs new Rx. Per notes ok to send to pharmacy but pharmacy has not received updated Rx. Thank You

## 2024-04-12 NOTE — Telephone Encounter (Signed)
 Pt husband advised. Verbalized understanding

## 2024-04-17 ENCOUNTER — Ambulatory Visit: Admitting: Physician Assistant

## 2024-04-28 ENCOUNTER — Other Ambulatory Visit: Payer: Self-pay | Admitting: Internal Medicine

## 2024-05-02 ENCOUNTER — Ambulatory Visit: Payer: Self-pay

## 2024-05-02 NOTE — Telephone Encounter (Signed)
      FYI Only or Action Required?: Action required by provider: request for appointment.  Patient was last seen in primary care on 11/29/2023 by Kendra Jon HERO, MD. Called Nurse Triage reporting Chest Pain, dizziness and headache. Symptoms began a week ago. Interventions attempted: Nothing. Symptoms are: stable.  Triage Disposition: See Physician Within 24 Hours  Patient/caregiver understands and will follow disposition?: yes  No openings with PCP. Pt husband requesting to only see PCP. Prefers not to schedule pt with J. Ostwalt FYI            Reason for Disposition  [1] Chest pain lasts < 5 minutes AND [2] NO chest pain or cardiac symptoms (e.g., breathing difficulty, sweating) now  (Exception: Chest pains that last only a few seconds.)  Answer Assessment - Initial Assessment Questions 1. LOCATION: Where does it hurt?       Around chest bra line 2. RADIATION: Does the pain go anywhere else? (e.g., into neck, jaw, arms, back)     no 3. ONSET: When did the chest pain begin? (Minutes, hours or days)      Last Thursday  4. PATTERN: Does the pain come and go, or has it been constant since it started?  Does it get worse with exertion?      Comes and goes-not present at time of call 5. DURATION: How long does it last (e.g., seconds, minutes, hours)     Pt could not say 6. SEVERITY: How bad is the pain?  (e.g., Scale 1-10; mild, moderate, or severe)    - MILD (1-3): doesn't interfere with normal activities     - MODERATE (4-7): interferes with normal activities or awakens from sleep    - SEVERE (8-10): excruciating pain, unable to do any normal activities       Heaviness but denies at time of call   7. CARDIAC RISK FACTORS: Do you have any history of heart problems or risk factors for heart disease? (e.g., angina, prior heart attack; diabetes, high blood pressure, high cholesterol, smoker, or strong family history of heart disease)     HTN 9. CAUSE:  What do you think is causing the chest pain?     unsure 10. OTHER SYMPTOMS: Do you have any other symptoms? (e.g., dizziness, nausea, vomiting, sweating, fever, difficulty breathing, cough)       Headaches, napping more, occasional dizziness (worse in shower, early in am , occasionally walking around, SOB with exertion  Protocols used: Chest Pain-A-AH

## 2024-05-02 NOTE — Telephone Encounter (Signed)
 Only lasts a few seconds, so doubt cardiac. Ok to be seen I noffice, not ED.  Please see if they will see someone else for acute visit as I am on vacation next week and would like her to be seen sooner than 7/15. Does anyone have a SD this week?

## 2024-05-03 NOTE — Telephone Encounter (Signed)
 Copied from CRM [id number]. Topic: General - Other >> May 03, 2024  9:50 AM Elle L wrote: Reason for CRM: The patient's husband returned the call from [place], CMA. He states that the patient is only comfortable seeing Dr. Myrla so he declined a sooner appointment with another Provider at this time. He also denied her having new or worsening symptoms that would require Nurse Triage.

## 2024-05-03 NOTE — Telephone Encounter (Signed)
 LVMTCB. Ok to advise per Dr.B and see if any sooner appt available upon call being returned

## 2024-05-04 ENCOUNTER — Telehealth: Admitting: Psychiatry

## 2024-05-04 ENCOUNTER — Encounter: Payer: Self-pay | Admitting: Psychiatry

## 2024-05-04 DIAGNOSIS — F411 Generalized anxiety disorder: Secondary | ICD-10-CM

## 2024-05-04 DIAGNOSIS — F3176 Bipolar disorder, in full remission, most recent episode depressed: Secondary | ICD-10-CM | POA: Diagnosis not present

## 2024-05-04 DIAGNOSIS — F5105 Insomnia due to other mental disorder: Secondary | ICD-10-CM | POA: Diagnosis not present

## 2024-05-04 DIAGNOSIS — G2401 Drug induced subacute dyskinesia: Secondary | ICD-10-CM

## 2024-05-04 NOTE — Progress Notes (Signed)
 Virtual Visit via Video Note  I connected with Kendra [name] on 05/04/24 at  3:00 PM EDT by a video enabled telemedicine application and verified that I am speaking with the correct person using two identifiers.  Location Provider Location : [facility] Patient Location : Home  Participants: Patient , Spouse,Provider   I discussed the limitations of evaluation and management by telemedicine and the availability of in person appointments. The patient expressed understanding and agreed to proceed.   I discussed the assessment and treatment plan with the patient. The patient was provided an opportunity to ask questions and all were answered. The patient agreed with the plan and demonstrated an understanding of the instructions.   The patient was advised to call back or seek an in-person evaluation if the symptoms worsen or if the condition fails to improve as anticipated.   [facility] MD OP Progress Note  05/04/2024 3:30 PM Kendra [name]  MRN:  [id number]  Chief Complaint:  Chief Complaint  Patient presents with   Follow-up   Depression   Anxiety   Medication Refill   mood disorder   Discussed the use of AI scribe software for clinical note transcription with the patient, who gave verbal consent to proceed.  History of Present Illness Kendra [name] is a 78 year old Caucasian female, married, retired, lives in [place], has a history of bipolar disorder with bipolar disorder, anxiety disorder, and insomnia, hyperprolactinemia, history of multifocal stage II breast cancer status post lumpectomy, ovarian cyst removal, bilateral hearing loss was evaluated by telemedicine today.  She is accompanied by her husband, Kendra [name].  Her risperidone  dosage was reduced to 0.25 mg twice daily during her last visit. There has been no significant worsening of depression or anxiety, although she occasionally experiences confusion about the sequence of events and requires prompting for daily activities.  She is  currently taking gabapentin  100 mg three times a day, reduced from 300 mg three times a day due to the absence of pain.  She is compliant on the fluoxetine  as prescribed, trazodone  as needed.  Denies any side effects.  She requires assistance with daily activities, including showering and dressing, provided by her caregiver. She engages in activities such as puzzles and visits the salon with her caregiver.  She has an upcoming appointment at the movement disorder clinic and is no longer under the care of neurology. She is not currently taking any anti-Parkinson medications.  Denies suicidal thoughts, auditory hallucinations, or visual hallucinations. She has experienced some chest discomfort and cough, which will be evaluated by another doctor next week.  She appeared to be alert, oriented to person place time situation.  She was able to answer questions appropriately with support from her spouse who provided collateral information.    Visit Diagnosis:    ICD-10-CM   1. Bipolar disorder, in full remission, most recent episode depressed (HCC)  F31.76     2. GAD (generalized anxiety disorder)  F41.1     3. Insomnia due to mental condition  F51.05    Mood, pain    4. Tardive dyskinesia  G24.01       Past Psychiatric History: I have reviewed past psychiatric history from progress note on 03/01/2019.  Past trials of risperidone , Prozac , Seroquel , Ativan .  Past Medical History:  Past Medical History:  Diagnosis Date   Anxiety    Breast cancer (HCC) 03/2018   right breast cancer at 11:00 and 1:00   Bursitis    bilateral hips and knees   Complication of anesthesia  Depression    Family history of breast cancer 03/24/2018   Family history of lung cancer 03/24/2018   History of alcohol dependence (HCC) 2007   no ETOH; resolved since 2007   History of hiatal hernia    History of psychosis 2014   due to sleep disturbance   Hypertension    Personal history of radiation therapy  06/2018-07/2018   right breast ca   PONV (postoperative nausea and vomiting)     Past Surgical History:  Procedure Laterality Date   BREAST BIOPSY Right 03/14/2018   11:00 DCIS and invasive ductal carcinoma   BREAST BIOPSY Right 03/14/2018   1:00 Invasive ductal carcinoma   BREAST LUMPECTOMY Right 04/27/2018   lumpectomy of 11 and 1:00 cancers, clear margins, negative LN   BREAST LUMPECTOMY WITH RADIOACTIVE SEED AND SENTINEL LYMPH NODE BIOPSY Right 04/27/2018   Procedure: RIGHT BREAST LUMPECTOMY WITH RADIOACTIVE SEED X 2 AND RIGHT SENTINEL LYMPH NODE BIOPSY;  Surgeon: Mikell Katz, MD;  Location: [facility];  Service: General;  Laterality: Right;   CATARACT EXTRACTION W/PHACO Left 08/30/2019   Procedure: CATARACT EXTRACTION PHACO AND INTRAOCULAR LENS PLACEMENT (IOC) LEFT panoptix toric  01:20.5  12.7%  10.26;  Surgeon: Mittie Gaskin, MD;  Location: [facility];  Service: Ophthalmology;  Laterality: Left;   CATARACT EXTRACTION W/PHACO Right 09/20/2019   Procedure: CATARACT EXTRACTION PHACO AND INTRAOCULAR LENS PLACEMENT (IOC) RIGHT 5.71  00:36.4  15.7%;  Surgeon: Mittie Gaskin, MD;  Location: [facility];  Service: Ophthalmology;  Laterality: Right;   LAPAROTOMY N/A 02/10/2018   Procedure: EXPLORATORY LAPAROTOMY;  Surgeon: Anitra Freddy NOVAK, MD;  Location: [facility];  Service: Gynecology;  Laterality: N/A;   MASS EXCISION  01/2018   abdominal   OVARIAN CYST REMOVAL     SALPINGOOPHORECTOMY Bilateral 02/10/2018   Procedure: BILATERAL SALPINGO OOPHORECTOMY; PERITONEAL WASHINGS;  Surgeon: Anitra Freddy NOVAK, MD;  Location: [facility];  Service: Gynecology;  Laterality: Bilateral;   TONSILLECTOMY     TONSILLECTOMY AND ADENOIDECTOMY  1953    Family Psychiatric History: I have reviewed family psychiatric history from progress note on 03/01/2019.  Family History:  Family History  Problem Relation Age of Onset   Diabetes Mother    Breast cancer Mother 53    Hypertension Mother    Lung cancer Father        asbestos exposure   Heart disease Brother 54   Breast cancer Cousin 68       paternal cousin   Heart disease Paternal Grandmother 57   Heart disease Paternal Grandfather     Social History: I have reviewed social history from progress note on 03/01/2019. Social History   Socioeconomic History   Marital status: Married    Spouse name: robert   Number of children: 0   Years of education: Not on file   Highest education level: Master's degree (e.g., MA, MS, MEng, MEd, MSW, MBA)  Occupational History   Occupation: retired Insurance underwriter of education  Tobacco Use   Smoking status: Never   Smokeless tobacco: Never  Vaping Use   Vaping status: Never Used  Substance and Sexual Activity   Alcohol use: Not Currently    Comment: alcohol dependence prior to 2007   Drug use: Never   Sexual activity: Yes    Partners: Male    Birth control/protection: Surgical  Other Topics Concern   Not on file  Social History Narrative   Not on file   Social Drivers of Health  Financial Resource Strain: Low Risk  (11/24/2023)   Overall Financial Resource Strain (CARDIA)    Difficulty of Paying Living Expenses: Not hard at all  Food Insecurity: No Food Insecurity (11/24/2023)   Hunger Vital Sign    Worried About Running Out of Food in the Last Year: Never true    Ran Out of Food in the Last Year: Never true  Transportation Needs: No Transportation Needs (11/24/2023)   PRAPARE - Administrator, Civil Service (Medical): No    Lack of Transportation (Non-Medical): No  Physical Activity: Unknown (11/24/2023)   Exercise Vital Sign    Days of Exercise per Week: 0 days    Minutes of Exercise per Session: Not on file  Stress: Stress Concern Present (11/24/2023)   [employer] of Occupational Health - Occupational Stress Questionnaire    Feeling of Stress : To some extent  Social Connections: Moderately Integrated (11/24/2023)    Social Connection and Isolation Panel    Frequency of Communication with Friends and Family: Once a week    Frequency of Social Gatherings with Friends and Family: More than three times a week    Attends Religious Services: More than 4 times per year    Active Member of [employer] or Organizations: No    Attends Banker Meetings: Never    Marital Status: Married  Recent Concern: Social Connections - Socially Isolated (11/18/2023)   Social Connection and Isolation Panel    Frequency of Communication with Friends and Family: Never    Frequency of Social Gatherings with Friends and Family: Once a week    Attends Religious Services: Never    Database administrator or Organizations: No    Attends Banker Meetings: Never    Marital Status: Married    Allergies:  Allergies  Allergen Reactions   Hydroxyzine Anaphylaxis    Tongue swollen   Other Other (See Comments)    Food poisoning    Shrimp [Shellfish Allergy] Other (See Comments)    Food poisoning     Metabolic Disorder Labs: Lab Results  Component Value Date   HGBA1C 5.9 (H) 11/05/2023   Lab Results  Component Value Date   PROLACTIN 78.3 (H) 01/04/2023   PROLACTIN 70.5 (H) 05/12/2022   Lab Results  Component Value Date   CHOL 171 11/05/2023   TRIG 115 11/05/2023   HDL 57 11/05/2023   CHOLHDL 3.0 11/05/2023   LDLCALC 93 11/05/2023   LDLCALC 113 (H) 11/06/2022   Lab Results  Component Value Date   TSH 1.653 01/04/2023   TSH 2.31 04/13/2019    Therapeutic Level Labs: No results found for: LITHIUM No results found for: VALPROATE No results found for: CBMZ  Current Medications: Current Outpatient Medications  Medication Sig Dispense Refill   Acetaminophen  500 MG capsule Take 1,000 mg by mouth daily as needed for pain.     AMBULATORY NON FORMULARY MEDICATION 1 Bag by Other route daily. HERO smart dispenser 1 Bag 0   cyanocobalamin  (VITAMIN B12) 1000 MCG/ML injection INJECT 1ML INTO THE  MUSCLE EVERY 30 DAYS 3 mL 3   denosumab  (PROLIA ) 60 MG/ML SOSY injection Inject 60 mg into the skin every 6 (six) months. takes annually     FLUoxetine  HCl 60 MG TABS Take 1 tablet by mouth daily. 90 tablet 3   gabapentin  (NEURONTIN ) 100 MG capsule Take 1 capsule (100 mg total) by mouth 3 (three) times daily. 270 capsule 1   losartan -hydrochlorothiazide  (HYZAAR) 50-12.5 MG tablet  Take 1 tablet by mouth daily. 90 tablet 1   Multiple Vitamins-Minerals (VITAMIN D3 COMPLETE PO) Take 500 Units by mouth in the morning, at noon, and at bedtime.     nystatin  (MYCOSTATIN /NYSTOP ) powder APPLY TO AFFECTED AREA(S) 3 TIMES A DAY 60 g 0   risperiDONE  (RISPERDAL ) 0.25 MG tablet Take 1 tablet (0.25 mg total) by mouth 2 (two) times daily.     traZODone  (DESYREL ) 100 MG tablet Take 1 tablet (100 mg total) by mouth at bedtime. 90 tablet 3   No current facility-administered medications for this visit.     Musculoskeletal: Strength & Muscle Tone: UTA Gait & Station: Seated Patient leans: N/A  Psychiatric Specialty Exam: Review of Systems  Psychiatric/Behavioral: Negative.      There were no vitals taken for this visit.There is no height or weight on file to calculate BMI.  General Appearance: Casual  Eye Contact:  Fair  Speech:  Clear and Coherent  Volume:  Normal  Mood:  Euthymic  Affect:  Flat  Thought Process:  Goal Directed and Descriptions of Associations: Intact  Orientation:  Full (Time, Place, and Person)  Thought Content: Logical   Suicidal Thoughts:  No  Homicidal Thoughts:  No  Memory:  Immediate;   Fair Recent;   Fair Remote;   Limited  Judgement:  Fair  Insight:  Fair  Psychomotor Activity:  Normal  Concentration:  Concentration: Fair and Attention Span: Fair  Recall:  [employer] of Knowledge: Fair  Language: Fair  Akathisia:  No  Handed:  Right  AIMS (if indicated): UTA  Assets:  Communication Skills Desire for Improvement Housing Social Support  ADL's:  Intact with  support  Cognition: WNL  Sleep:  Fair   Screenings: Midwife Visit from 01/24/2024 in [facility] Office Visit from 08/10/2023 in [facility] Office Visit from 04/08/2023 in [facility] Office Visit from 09/04/2022 in [facility] Office Visit from 06/04/2022 in [facility]  AIMS Total Score 1 0 0 0 0   GAD-7    Flowsheet Row Office Visit from 01/24/2024 in [facility] Office Visit from 11/29/2023 in [facility] Office Visit from 11/05/2023 in [facility] Office Visit from 08/10/2023 in [facility] Counselor from 03/25/2021 in [facility]  Total GAD-7 Score 1 8 12 9 4    PHQ2-9    Flowsheet Row Office Visit from 01/24/2024 in [facility] Office Visit from 11/29/2023 in [facility] Office Visit from 11/22/2023 in [facility] Visit from 11/05/2023 in [facility] Office Visit from 08/10/2023 in [facility]  PHQ-2 Total Score 0 1 1 6 2   PHQ-9 Total Score 7 10 -- 18 17   Flowsheet Row Video Visit from 05/04/2024 in [facility] Video Visit from 03/20/2024 in [facility] Office Visit from 01/24/2024 in [facility]  C-SSRS RISK CATEGORY No Risk No Risk No Risk     Assessment and Plan: Natia Fahmy is a 78 year old Caucasian female, lives in [place], has a history of bipolar disorder, generalized  anxiety disorder was evaluated by telemedicine today.  Discussed assessment and plan as noted below.  Bipolar disorder type I in remission Generalized anxiety disorder-stable Currently reports mood symptoms as stable on the current medication regimen.  Recent reduction of risperidone  a month and a half ago has not changed her mood symptoms which is still maintained on the current combination of medication regimen.  Patient however is not interested in further tapering off of risperidone .  Does have a history of tardive dyskinesia, movement disorder currently awaiting an appointment with movement disorder clinic. Continue Risperidone  0.25 mg twice daily Continue Prozac  60 mg daily Continue Gabapentin  100 mg 3 times a day, recently dosage reduced by primary care provider.G  Insomnia/snoring-stable Currently reports sleep is overall good. Continue Trazodone  100 mg at bedtime. Will need sufficient pain management.  History of tardive dyskinesia/movement disorder-chronic-patient has upcoming appointment with movement disorder clinic.  Collateral information obtained from spouse who reports patient is currently being maintained on the current medication regimen.  Follow-up Follow-up in clinic in 3 months or sooner if needed.  Collaboration of Care: Collaboration of Care: Other encouraged to establish care with movement disorder clinic.  Patient/Guardian was advised Release of Information must be obtained prior to any record release in order to collaborate their care with an outside provider. Patient/Guardian was advised if they have not already done so to contact the registration department to sign all necessary forms in order for us  to release information regarding their care.   Consent: Patient/Guardian gives verbal consent for treatment and assignment of benefits for services provided during this visit. Patient/Guardian expressed understanding and agreed to proceed.   This note was generated  in part or whole with voice recognition software. Voice recognition is usually quite accurate but there are transcription errors that can and very often do occur. I apologize for any typographical errors that were not detected and corrected.    Lotus Santillo, MD 05/04/2024, 3:30 PM

## 2024-05-04 NOTE — Telephone Encounter (Signed)
 Noted

## 2024-05-06 ENCOUNTER — Other Ambulatory Visit: Payer: Self-pay | Admitting: Family Medicine

## 2024-05-09 ENCOUNTER — Telehealth: Payer: Self-pay | Admitting: Family Medicine

## 2024-05-09 NOTE — Telephone Encounter (Signed)
 [facility] is requesting refill losartan -hydrochlorothiazide  (HYZAAR) 50-12.5 MG tablet   Please advise

## 2024-05-09 NOTE — Telephone Encounter (Signed)
 Rx sent E-Prescribing Status: Receipt confirmed by pharmacy (05/09/2024 12:13 PM EDT)

## 2024-05-09 NOTE — Telephone Encounter (Signed)
 Requested Prescriptions  Pending Prescriptions Disp Refills   losartan -hydrochlorothiazide  (HYZAAR) 50-12.5 MG tablet [Pharmacy Med Name: LOSARTAN -HCTZ 50-12.5 MG TAB] 90 tablet 0    Sig: TAKE 1 TABLET BY MOUTH DAILY     Cardiovascular: [facility] + Diuretic Combos Failed - 05/09/2024 12:13 PM      Failed - Na in normal range and within 180 days    Sodium  Date Value Ref Range Status  02/29/2024 134 (L) 135 - 145 mmol/L Final  11/05/2023 134 134 - 144 mmol/L Final         Failed - Valid encounter within last 6 months    Recent Outpatient Visits   None            Passed - K in normal range and within 180 days    Potassium  Date Value Ref Range Status  02/29/2024 3.6 3.5 - 5.1 mmol/L Final         Passed - Cr in normal range and within 180 days    Creatinine  Date Value Ref Range Status  02/29/2024 0.74 0.44 - 1.00 mg/dL Final         Passed - eGFR is 10 or above and within 180 days    GFR, Est AFR Am  Date Value Ref Range Status  03/23/2018 >60 >60 mL/min Final    Comment:    (NOTE) The eGFR has been calculated using the CKD EPI equation. This calculation has not been validated in all clinical situations. eGFR's persistently <60 mL/min signify possible Chronic Kidney Disease.    GFR calc Af Amer  Date Value Ref Range Status  04/29/2020 >60 >60 mL/min Final   GFR, Estimated  Date Value Ref Range Status  02/29/2024 >60 >60 mL/min Final    Comment:    (NOTE) Calculated using the CKD-EPI Creatinine Equation (2021)    eGFR  Date Value Ref Range Status  11/05/2023 87 >59 mL/min/1.73 Final         Passed - Patient is not pregnant      Passed - Last BP in normal range    BP Readings from Last 1 Encounters:  02/29/24 139/60

## 2024-05-16 ENCOUNTER — Ambulatory Visit: Admitting: Family Medicine

## 2024-05-16 ENCOUNTER — Encounter: Payer: Self-pay | Admitting: Family Medicine

## 2024-05-16 VITALS — BP 108/66 | HR 83 | Temp 98.4°F | Resp 16 | Wt 230.4 lb

## 2024-05-16 DIAGNOSIS — G894 Chronic pain syndrome: Secondary | ICD-10-CM | POA: Diagnosis not present

## 2024-05-16 DIAGNOSIS — M7989 Other specified soft tissue disorders: Secondary | ICD-10-CM | POA: Diagnosis not present

## 2024-05-16 DIAGNOSIS — R400 Somnolence: Secondary | ICD-10-CM

## 2024-05-16 DIAGNOSIS — R42 Dizziness and giddiness: Secondary | ICD-10-CM

## 2024-05-16 DIAGNOSIS — R0602 Shortness of breath: Secondary | ICD-10-CM | POA: Diagnosis not present

## 2024-05-16 DIAGNOSIS — N319 Neuromuscular dysfunction of bladder, unspecified: Secondary | ICD-10-CM

## 2024-05-16 DIAGNOSIS — R9431 Abnormal electrocardiogram [ECG] [EKG]: Secondary | ICD-10-CM

## 2024-05-16 DIAGNOSIS — R5381 Other malaise: Secondary | ICD-10-CM

## 2024-05-16 NOTE — Progress Notes (Signed)
 Acute visit   Patient: Kendra [name]   DOB: February 25, 1946   78 y.o. Female  MRN: [id number] PCP: Myrla Jon HERO, MD   Chief Complaint  Patient presents with   URI    Symptoms: cough, some dizziness, chest tightness runny nose. Frequency: x months   Shortness of Breath    With any exertion   Subjective    Discussed the use of AI scribe software for clinical note transcription with the patient, who gave verbal consent to proceed.  History of Present Illness   Kendra [name] is a 78 year old female who presents with chronic cough, chest tightness, and shortness of breath.  She experiences a chronic cough, chest tightness, and shortness of breath with exertion for several months. A previous chest x-ray was unremarkable. Her physical activity is minimal, limited to moving to the bathroom or dining table. She used to walk laps around the house.  She feels tired shortly after waking and has been sleeping in a recliner for over four months due to difficulty lying down and sitting up, exacerbated by vertigo. The vertigo is described as lightheadedness and a sensation of possibly passing out rather than a spinning sensation. Her caregiver notes she often appears slumped over in the recliner.  She experiences swelling in her right leg and ankle, which varies in severity. Occasional pain in the hernia area is attributed to intestinal discomfort. She has a neurogenic bladder, resulting in incontinence and a lack of sensation to urinate. Her caregiver assists with timed bathroom visits, and she wears protective garments due to incontinence.  Her current medications include gabapentin  100 mg three times a day and acetaminophen  as needed for incidental pain.        Review of Systems  Objective    BP 108/66 (BP Location: Left Arm, Patient Position: Sitting, Cuff Size: Large)   Pulse 83   Temp 98.4 F (36.9 C) (Oral)   Resp 16   Wt 230 lb 6.4 oz (104.5 kg)   SpO2 96%   BMI 37.19  kg/m  Physical Exam Vitals reviewed.  Constitutional:      General: She is not in acute distress.    Appearance: Normal appearance. She is well-developed. She is not diaphoretic.  HENT:     Head: Normocephalic and atraumatic.  Eyes:     General: No scleral icterus.    Conjunctiva/sclera: Conjunctivae normal.  Neck:     Thyroid : No thyromegaly.  Cardiovascular:     Rate and Rhythm: Normal rate and regular rhythm.     Heart sounds: Normal heart sounds. No murmur heard. Pulmonary:     Effort: Pulmonary effort is normal. No respiratory distress.     Breath sounds: Rales (mild, bilateral bases) present. No wheezing or rhonchi.  Abdominal:     General: Bowel sounds are normal. There is no distension.     Palpations: Abdomen is soft.     Tenderness: There is no abdominal tenderness.     Hernia: A hernia is present.  Musculoskeletal:     Cervical back: Neck supple.     Right lower leg: Edema present.     Left lower leg: Edema present.  Lymphadenopathy:     Cervical: No cervical adenopathy.  Skin:    General: Skin is warm and dry.     Findings: No rash.  Neurological:     Mental Status: She is alert and oriented to person, place, and time. Mental status is at baseline.  Psychiatric:  Mood and Affect: Mood normal.        Behavior: Behavior normal.     EKG: NSR, L axis deviation (new from last EKG)  No results found for any visits on 05/16/24.  Assessment & Plan     Problem List Items Addressed This Visit       Other   Chronic pain syndrome (Chronic)   Neurogenic bladder   Other Visit Diagnoses       SOB (shortness of breath) on exertion    -  Primary   Relevant Orders   EKG 12-Lead   ECHOCARDIOGRAM COMPLETE   B Nat Peptide   Comprehensive metabolic panel with GFR   CBC w/Diff/Platelet   Ambulatory referral to Cardiology     Left axis deviation       Relevant Orders   ECHOCARDIOGRAM COMPLETE   Comprehensive metabolic panel with GFR   CBC w/Diff/Platelet    Ambulatory referral to Cardiology     Leg swelling       Relevant Orders   ECHOCARDIOGRAM COMPLETE   B Nat Peptide   Comprehensive metabolic panel with GFR   CBC w/Diff/Platelet   Ambulatory referral to Cardiology     Lightheadedness         Drowsiness         Physical deconditioning               Shortness of breath with exertion and possible heart failure Chronic shortness of breath with exertion, increased frequency. New left axis deviation on EKG, indicating possible heart failure. Differential includes deconditioning and fluid overload. Peripheral edema and lung sounds suggest fluid retention. EKG changes suggest possible heart failure or other cardiac issues. Further evaluation needed to rule out heart failure and assess heart function. - Order BNP and echocardiogram to assess for heart failure and evaluate heart function - Refer to cardiologist for further evaluation - Initiate diuretic therapy if BNP is elevated  Peripheral edema Chronic peripheral edema, likely related to possible heart failure and fluid retention. Observed in legs and possibly abdomen. Edema may be contributing to shortness of breath and reduced stamina. - Monitor edema in conjunction with heart failure workup - Consider diuretic therapy if heart failure is confirmed  Chronic pain, improved after nerve block; gabapentin  taper Chronic pain significantly improved after nerve block. Gabapentin  tapering due to drowsiness and fatigue. Concerns about anxiety management post-taper. Gabapentin  may be contributing to drowsiness, fatigue, and lightheadedness. - Taper gabapentin  to twice daily for two weeks, then once daily for two weeks, then discontinue - Monitor for changes in pain and anxiety during taper  Drowsiness and fatigue, likely medication-related Drowsiness and fatigue likely related to gabapentin  use. Impacting daily activities and contributing to deconditioning. Reducing gabapentin  may improve  alertness and activity levels. - Taper gabapentin  as outlined to reduce drowsiness and fatigue  Vertigo and lightheadedness on positional change Lightheadedness on positional change, distinct from vertigo. Possibly related to gabapentin  use. Reducing gabapentin  may alleviate symptoms. - Taper gabapentin  to assess impact on lightheadedness  Neurogenic bladder with urinary incontinence Chronic urinary incontinence due to neurogenic bladder. Lack of sensation to void, leading to incontinence. Scheduled toileting or alarms may help manage incontinence. - Implement scheduled toileting or use of alarms to manage incontinence  Hernia with intermittent pain Intermittent pain in hernia area, possibly due to intestinal movement within the hernia. Pain is not constant and may be related to hernia contents moving.        No orders of the defined  types were placed in this encounter.    Return for as scheduled.       I personally spent a total of 45 minutes in the care of the patient today including preparing to see the patient, getting/reviewing separately obtained history, performing a medically appropriate exam/evaluation, counseling and educating, placing orders, referring and communicating with other health care professionals, documenting clinical information in the EHR, independently interpreting results, and coordinating care.   Jon Eva, MD  [facility] [phone] (phone) [phone] (fax)  [facility]

## 2024-05-16 NOTE — Patient Instructions (Signed)
 Decrease gabapentin  to 100mg  twice daily for 2 weeks and then 100mg  at bedtime for 2 weeks. Then stop

## 2024-05-18 ENCOUNTER — Ambulatory Visit: Payer: Self-pay | Admitting: Family Medicine

## 2024-05-18 LAB — CBC WITH DIFFERENTIAL/PLATELET
Basophils Absolute: 0.1 x10E3/uL (ref 0.0–0.2)
Basos: 1 %
EOS (ABSOLUTE): 0.3 x10E3/uL (ref 0.0–0.4)
Eos: 4 %
Hematocrit: 44.7 % (ref 34.0–46.6)
Hemoglobin: 13.8 g/dL (ref 11.1–15.9)
Immature Grans (Abs): 0 x10E3/uL (ref 0.0–0.1)
Immature Granulocytes: 0 %
Lymphocytes Absolute: 1.4 x10E3/uL (ref 0.7–3.1)
Lymphs: 22 %
MCH: 27.1 pg (ref 26.6–33.0)
MCHC: 30.9 g/dL — ABNORMAL LOW (ref 31.5–35.7)
MCV: 88 fL (ref 79–97)
Monocytes Absolute: 0.7 x10E3/uL (ref 0.1–0.9)
Monocytes: 12 %
Neutrophils Absolute: 3.8 x10E3/uL (ref 1.4–7.0)
Neutrophils: 60 %
Platelets: 223 x10E3/uL (ref 150–450)
RBC: 5.09 x10E6/uL (ref 3.77–5.28)
RDW: 13.9 % (ref 11.7–15.4)
WBC: 6.3 x10E3/uL (ref 3.4–10.8)

## 2024-05-18 LAB — COMPREHENSIVE METABOLIC PANEL WITH GFR
ALT: 21 IU/L (ref 0–32)
AST: 20 IU/L (ref 0–40)
Albumin: 4.3 g/dL (ref 3.8–4.8)
Alkaline Phosphatase: 39 IU/L — ABNORMAL LOW (ref 44–121)
BUN/Creatinine Ratio: 8 — ABNORMAL LOW (ref 12–28)
BUN: 6 mg/dL — ABNORMAL LOW (ref 8–27)
Bilirubin Total: 0.3 mg/dL (ref 0.0–1.2)
CO2: 22 mmol/L (ref 20–29)
Calcium: 10.2 mg/dL (ref 8.7–10.3)
Chloride: 99 mmol/L (ref 96–106)
Creatinine, Ser: 0.77 mg/dL (ref 0.57–1.00)
Globulin, Total: 2.2 g/dL (ref 1.5–4.5)
Glucose: 95 mg/dL (ref 70–99)
Potassium: 4.1 mmol/L (ref 3.5–5.2)
Sodium: 138 mmol/L (ref 134–144)
Total Protein: 6.5 g/dL (ref 6.0–8.5)
eGFR: 80 mL/min/1.73 (ref >59)

## 2024-05-18 LAB — BRAIN NATRIURETIC PEPTIDE: BNP: 15.6 pg/mL (ref 0.0–100.0)

## 2024-05-27 ENCOUNTER — Other Ambulatory Visit: Payer: Self-pay | Admitting: Internal Medicine

## 2024-05-29 ENCOUNTER — Encounter: Payer: Self-pay | Admitting: Internal Medicine

## 2024-05-30 ENCOUNTER — Encounter: Payer: Self-pay | Admitting: Family Medicine

## 2024-05-30 ENCOUNTER — Ambulatory Visit: Payer: Self-pay | Admitting: Family Medicine

## 2024-05-30 VITALS — BP 134/69 | HR 69 | Temp 98.2°F | Ht 66.0 in | Wt 224.2 lb

## 2024-05-30 DIAGNOSIS — K432 Incisional hernia without obstruction or gangrene: Secondary | ICD-10-CM

## 2024-05-30 DIAGNOSIS — R7303 Prediabetes: Secondary | ICD-10-CM | POA: Diagnosis not present

## 2024-05-30 DIAGNOSIS — E7849 Other hyperlipidemia: Secondary | ICD-10-CM | POA: Diagnosis not present

## 2024-05-30 DIAGNOSIS — Z0000 Encounter for general adult medical examination without abnormal findings: Secondary | ICD-10-CM

## 2024-05-30 DIAGNOSIS — R195 Other fecal abnormalities: Secondary | ICD-10-CM

## 2024-05-30 NOTE — Patient Instructions (Signed)
 Consider asking at the pharmacy about the tetanus shot (TDAP) and the shingles shot (Shingrix)  ?

## 2024-05-30 NOTE — Progress Notes (Signed)
 Annual Wellness Visit     Patient: Kendra [name], Female    DOB: 12/15/1945, 78 y.o.   MRN: [id number] Visit Date: 05/30/2024  Today's Provider: Jon Eva, MD   Chief Complaint  Patient presents with   Annual Exam    Diet: Normal, feels as if it is pretty good  Exercise: Tries to do some walking, does PT Sleep: Problems falling asleep Overall feeling: Feels pretty good     Subjective    Kendra [name] is a 78 y.o. female who presents today for her Annual Wellness Visit.  Discussed the use of AI scribe software for clinical note transcription with the patient, who gave verbal consent to proceed.  History of Present Illness   [name] is a 78 year old female who presents for an annual physical wellness visit.  She experiences loose bowel movements after consuming legumes, leading her to eliminate them from her diet. There is no blood in the stool, fever, or chills, and bowel movements are regular otherwise.  She has ongoing discomfort from a hernia, described as a sensation of pressure.  Her vaccinations include a recent tetanus vaccine, though the timing is unclear, and her pneumonia vaccinations are current. She received a flu shot in January.             Medications: Outpatient Medications Prior to Visit  Medication Sig   Acetaminophen  500 MG capsule Take 1,000 mg by mouth daily as needed for pain.   AMBULATORY NON FORMULARY MEDICATION 1 Bag by Other route daily. [facility]O smart dispenser   cyanocobalamin  (VITAMIN B12) 1000 MCG/ML injection INJECT 1ML INTO THE MUSCLE EVERY 30 DAYS   FLUoxetine  HCl 60 MG TABS Take 1 tablet by mouth daily.   losartan -hydrochlorothiazide  (HYZAAR) 50-12.5 MG tablet TAKE 1 TABLET BY MOUTH DAILY   Multiple Vitamins-Minerals (VITAMIN D3 COMPLETE PO) Take 500 Units by mouth in the morning, at noon, and at bedtime.   nystatin  (MYCOSTATIN /NYSTOP ) powder APPLY TO AFFECTED AREA(S) 3 TIMES A DAY   risperiDONE  (RISPERDAL ) 0.25  MG tablet Take 1 tablet (0.25 mg total) by mouth 2 (two) times daily.   traZODone  (DESYREL ) 100 MG tablet Take 1 tablet (100 mg total) by mouth at bedtime.   [DISCONTINUED] gabapentin  (NEURONTIN ) 100 MG capsule Take 1 capsule (100 mg total) by mouth 3 (three) times daily.   No facility-administered medications prior to visit.    Allergies  Allergen Reactions   Hydroxyzine Anaphylaxis    Tongue swollen   Other Other (See Comments)    Food poisoning    Shrimp [Shellfish Allergy] Other (See Comments)    Food poisoning     Patient Care Team: Eva Jon HERO, MD as PCP - General (Family Medicine) Lenn Aran, MD as Referring Physician (Radiation Oncology) Mittie Gaskin, MD as Referring Physician (Ophthalmology) Deidre Kotyk, PT (Inactive) as Physical Therapist (Physical Therapy) Rennie Cindy SAUNDERS, MD as Consulting Physician (Hematology and Oncology) Tanya Glisson, MD as Consulting Physician (Pain Medicine)  Review of Systems       Objective    Vitals: BP 134/69 (BP Location: Left Arm, Patient Position: Sitting, Cuff Size: Normal)   Pulse 69   Temp 98.2 F (36.8 C) (Oral)   Ht 5' 6 (1.676 m)   Wt 224 lb 3.2 oz (101.7 kg)   SpO2 95%   BMI 36.19 kg/m      Physical Exam Vitals reviewed.  Constitutional:      General: She is not in acute distress.    Appearance:  Normal appearance. She is well-developed. She is not diaphoretic.  HENT:     Head: Normocephalic and atraumatic.     Right Ear: External ear normal.     Left Ear: External ear normal.     Nose: Nose normal.     Mouth/Throat:     Mouth: Mucous membranes are moist.     Pharynx: Oropharynx is clear. No oropharyngeal exudate.  Eyes:     General: No scleral icterus.    Conjunctiva/sclera: Conjunctivae normal.     Pupils: Pupils are equal, round, and reactive to light.  Neck:     Thyroid : No thyromegaly.  Cardiovascular:     Rate and Rhythm: Normal rate and regular rhythm.      Heart sounds: Normal heart sounds. No murmur heard. Pulmonary:     Effort: Pulmonary effort is normal. No respiratory distress.     Breath sounds: Normal breath sounds. No wheezing or rales.  Abdominal:     General: There is no distension.     Palpations: Abdomen is soft.     Tenderness: There is no abdominal tenderness.     Hernia: A hernia is present.  Musculoskeletal:        General: No deformity.     Cervical back: Neck supple.     Right lower leg: No edema.     Left lower leg: No edema.  Lymphadenopathy:     Cervical: No cervical adenopathy.  Skin:    General: Skin is warm and dry.     Findings: No rash.  Neurological:     Mental Status: She is alert and oriented to person, place, and time. Mental status is at baseline.     Gait: Gait normal.  Psychiatric:        Mood and Affect: Mood normal.        Behavior: Behavior normal.        Thought Content: Thought content normal.     Most recent functional status assessment:    05/30/2024   10:35 AM  In your present state of health, do you have any difficulty performing the following activities:  Hearing? 1  Comment PT wears hearing aids and has had eye surgery  Vision? 1  Difficulty concentrating or making decisions? 1  Walking or climbing stairs? 1  Comment Patient uses walker  Dressing or bathing? 1  Doing errands, shopping? 1  Preparing Food and eating ? Y  Using the Toilet? N  In the past six months, have you accidently leaked urine? Y  Do you have problems with loss of bowel control? Y  Managing your Medications? Y  Managing your Finances? Y  Housekeeping or managing your Housekeeping? Y  Comment Has someone help with household tasks   Most recent fall risk assessment:    05/30/2024   10:43 AM  Fall Risk   Falls in the past year? 1  Number falls in past yr: 0  Injury with Fall? 0  Risk for fall due to : History of fall(s);Impaired balance/gait  Follow up Falls evaluation completed    Most recent  depression screenings:    05/30/2024   10:40 AM 05/16/2024    9:35 AM  PHQ 2/9 Scores  PHQ - 2 Score 0 2  PHQ- 9 Score 8 15   Most recent cognitive screening:    10/21/2021   11:02 AM  6CIT Screen  What Year? 0 points  What month? 0 points  What time? 0 points  Count back from 20 2  points  Months in reverse 4 points  Repeat phrase 4 points  Total Score 10 points   Most recent Audit-C alcohol use screening    05/30/2024   10:38 AM  Alcohol Use Disorder Test (AUDIT)  1. How often do you have a drink containing alcohol? 0  2. How many drinks containing alcohol do you have on a typical day when you are drinking? 0  3. How often do you have six or more drinks on one occasion? 0  AUDIT-C Score 0   A score of 3 or more in women, and 4 or more in men indicates increased risk for alcohol abuse, EXCEPT if all of the points are from question 1   No results found.  No results found for any visits on 05/30/24.  Assessment & Plan     Annual wellness visit done today including the all of the following: Reviewed patient's Family Medical History Reviewed and updated list of patient's medical providers Assessment of cognitive impairment was done Assessed patient's functional ability Established a written schedule for health screening services Health Risk Assessent Completed and Reviewed  Exercise Activities and Dietary recommendations  Goals      Caregiver support resources     Activities and task to complete in order to accomplish goals.   Self Support options  (continue with self care plan previously in place-spending time with friends and maintaining personal interest, CSW to send resources for Dementia support by mail, continue to utilize calendar to organize  dates and time of medical appointments)       LIFESTYLE - DECREASE FALLS RISK     Recommend to remove any items from the home that may cause slips or trips.        Immunization History  Administered Date(s)  Administered   Fluad Quad(high Dose 65+) 07/19/2019, 08/29/2020, 08/12/2021   Fluad Trivalent(High Dose 65+) 11/05/2023   PNEUMOCOCCAL CONJUGATE-20 10/21/2021   Pneumococcal Polysaccharide-23 08/29/2020   Unspecified SARS-COV-2 Vaccination 12/15/2019, 01/12/2020    Health Maintenance  Topic Date Due   DTaP/Tdap/Td (1 - Tdap) Never done   Zoster Vaccines- Shingrix (1 of 2) Never done   COVID-19 Vaccine (3 - Mixed Product risk series) 06/15/2024 (Originally 02/09/2020)   INFLUENZA VACCINE  06/02/2024   Medicare Annual Wellness (AWV)  05/30/2025   DEXA SCAN  07/25/2027   Pneumococcal Vaccine: 50+ Years  Completed   Hepatitis C Screening  Completed   Hepatitis B Vaccines  Aged Out   HPV VACCINES  Aged Out   Meningococcal B Vaccine  Aged Out   Fecal DNA (Cologuard)  Discontinued     Discussed health benefits of physical activity, and encouraged her to engage in regular exercise appropriate for her age and condition.    Problem List Items Addressed This Visit       Other   Incisional hernia, without obstruction or gangrene   Other hyperlipidemia   Prediabetes   Other Visit Diagnoses       Encounter for annual wellness visit (AWV) in Medicare patient    -  Primary     Encounter for annual physical exam         Loose stools               Recurrent loose stools Intermittent episodes of loose stools over the past few weeks, possibly related to dietary intake of legumes. No blood, fever, or chills associated with the episodes. Regular bowel movements occur between episodes. - Monitor dietary intake and avoid  legumes - Observe for any changes in symptoms or new symptoms such as blood in stool or fever  Large abdominal hernia with intermittent discomfort Large abdominal hernia with intermittent discomfort. No current plans for surgical intervention due to aversion to surgery and low risk of complications such as bowel obstruction due to the size of the hernia. Larger hernias have  a lower risk of bowel obstruction compared to smaller ones. Surgery could improve quality of life by alleviating discomfort and pressure. - Monitor for any changes in symptoms or increase in discomfort - Discuss surgical options if quality of life is significantly affected  Mobility impairment Mobility impairment possibly related to low back issues. Upcoming appointment at [facility] [address] for further evaluation. Previous neurosurgical consultation suggested potential improvement with surgical intervention, but resistant to surgery. - Attend appointment at [facility] for further evaluation  Adult Wellness Visit Routine wellness visit to review preventive health measures and ensure up-to-date screenings and vaccinations. - Review preventive health measures annually - Ensure up-to-date vaccinations and screenings - Check with pharmacy for tetanus and shingles vaccine records - Complete bone density scan and mammogram as scheduled       Return in about 6 months (around 11/30/2024) for chronic disease f/u.     Jon Eva, MD  [facility] [phone] (phone) [phone] (fax)  [facility]

## 2024-06-05 NOTE — Telephone Encounter (Signed)
 FYI

## 2024-06-29 ENCOUNTER — Other Ambulatory Visit: Payer: Self-pay | Admitting: Internal Medicine

## 2024-07-06 ENCOUNTER — Ambulatory Visit: Attending: Family Medicine

## 2024-07-06 DIAGNOSIS — R0602 Shortness of breath: Secondary | ICD-10-CM | POA: Diagnosis not present

## 2024-07-06 DIAGNOSIS — M7989 Other specified soft tissue disorders: Secondary | ICD-10-CM | POA: Diagnosis not present

## 2024-07-06 DIAGNOSIS — R9431 Abnormal electrocardiogram [ECG] [EKG]: Secondary | ICD-10-CM | POA: Diagnosis not present

## 2024-07-07 LAB — ECHOCARDIOGRAM COMPLETE
AR max vel: 2.15 cm2
AV Area VTI: 2 cm2
AV Area mean vel: 1.97 cm2
AV Mean grad: 3 mmHg
AV Peak grad: 5.9 mmHg
Ao pk vel: 1.21 m/s
Area-P 1/2: 3.37 cm2
S' Lateral: 2.95 cm

## 2024-07-12 ENCOUNTER — Telehealth: Payer: Self-pay | Admitting: *Deleted

## 2024-07-12 NOTE — Telephone Encounter (Signed)
 LMOVM to verify card hx.

## 2024-07-13 DIAGNOSIS — D225 Melanocytic nevi of trunk: Secondary | ICD-10-CM | POA: Diagnosis not present

## 2024-07-13 DIAGNOSIS — D2272 Melanocytic nevi of left lower limb, including hip: Secondary | ICD-10-CM | POA: Diagnosis not present

## 2024-07-13 DIAGNOSIS — D2271 Melanocytic nevi of right lower limb, including hip: Secondary | ICD-10-CM | POA: Diagnosis not present

## 2024-07-13 DIAGNOSIS — D2262 Melanocytic nevi of left upper limb, including shoulder: Secondary | ICD-10-CM | POA: Diagnosis not present

## 2024-07-13 DIAGNOSIS — C44319 Basal cell carcinoma of skin of other parts of face: Secondary | ICD-10-CM | POA: Diagnosis not present

## 2024-07-13 DIAGNOSIS — L821 Other seborrheic keratosis: Secondary | ICD-10-CM | POA: Diagnosis not present

## 2024-07-13 DIAGNOSIS — D2261 Melanocytic nevi of right upper limb, including shoulder: Secondary | ICD-10-CM | POA: Diagnosis not present

## 2024-07-14 NOTE — Progress Notes (Signed)
 Cardiology Office Note  Date:  07/17/2024   ID:  [name], [name] 03/12/1946, MRN [id number]  PCP:  Myrla Jon HERO, MD   Chief Complaint  Patient presents with   New Patient (Initial Visit)    Ref by Dr. Angela Bacigalupo for shortness of breath. Patient c/o shortness of breath with little to no exertion & bilateral ankle swelling.     HPI:  Kendra Williamsis a 78 y.o. femalewith past medical history of: Cognitive impairment/memory loss Tardive dyskinesia   Bipolar 1 Chronic pain Alcohol use disorder Breast cancer in remission, surgery, radiation Hyperlipidemia Prediabetes Who presents by referral from Dr. Myrla for shortness of breath, leg swelling  On today's visit she presents with her husband Followed by neurology  Prior cardiac imaging reviewed Echocardiogram July 06, 2024 Normal LV function, no indication of elevated right heart pressures No valvular heart disease  Over past 4 years, sedentary, more fatigued, weight gain Leg weakness Limited mobility, presents today in a wheelchair No regular exercise program  Back MRI results reviewed Degeneration and short pedicles causes severe spinal stenosis from L1-2 to L3-4 and moderate stenosis at L4-5. Holding off on surgery for now  EKG personally reviewed by myself on todays visit EKG Interpretation Date/Time:  Monday July 17 2024 14:45:16 EDT Ventricular Rate:  71 PR Interval:  146 QRS Duration:  98 QT Interval:  430 QTC Calculation: 467 R Axis:   -27  Text Interpretation: Normal sinus rhythm Poor R wave progression When compared with ECG of 11-Apr-2019 10:31, No significant change was found Confirmed by Perla Lye [phone]) on 07/17/2024 3:15:05 PM    PMH:   has a past medical history of Allergy, Anxiety, Breast cancer (HCC) (03/2018), Bursitis, Cataract, Complication of anesthesia, Depression, Family history of breast cancer (03/24/2018), Family history of lung cancer (03/24/2018),  History of alcohol dependence (HCC) (2007), History of hiatal hernia, History of psychosis (2014), Hypertension, Neuromuscular disorder (HCC) (undiagnosed), Personal history of radiation therapy (06/2018-07/2018), PONV (postoperative nausea and vomiting), and Substance abuse (HCC) (alcohol; sober over 10 years).  PSH:    Past Surgical History:  Procedure Laterality Date   BREAST BIOPSY Right 03/14/2018   11:00 DCIS and invasive ductal carcinoma   BREAST BIOPSY Right 03/14/2018   1:00 Invasive ductal carcinoma   BREAST LUMPECTOMY Right 04/27/2018   lumpectomy of 11 and 1:00 cancers, clear margins, negative LN   BREAST LUMPECTOMY WITH RADIOACTIVE SEED AND SENTINEL LYMPH NODE BIOPSY Right 04/27/2018   Procedure: RIGHT BREAST LUMPECTOMY WITH RADIOACTIVE SEED X 2 AND RIGHT SENTINEL LYMPH NODE BIOPSY;  Surgeon: Mikell Katz, MD;  Location: [facility];  Service: General;  Laterality: Right;   CATARACT EXTRACTION W/PHACO Left 08/30/2019   Procedure: CATARACT EXTRACTION PHACO AND INTRAOCULAR LENS PLACEMENT (IOC) LEFT panoptix toric  01:20.5  12.7%  10.26;  Surgeon: Mittie Gaskin, MD;  Location: [facility];  Service: Ophthalmology;  Laterality: Left;   CATARACT EXTRACTION W/PHACO Right 09/20/2019   Procedure: CATARACT EXTRACTION PHACO AND INTRAOCULAR LENS PLACEMENT (IOC) RIGHT 5.71  00:36.4  15.7%;  Surgeon: Mittie Gaskin, MD;  Location: [facility];  Service: Ophthalmology;  Laterality: Right;   EYE SURGERY  cataract removal and lens implant   LAPAROTOMY N/A 02/10/2018   Procedure: EXPLORATORY LAPAROTOMY;  Surgeon: Anitra Freddy NOVAK, MD;  Location: [facility];  Service: Gynecology;  Laterality: N/A;   MASS EXCISION  01/2018   abdominal   OVARIAN CYST REMOVAL     SALPINGOOPHORECTOMY Bilateral 02/10/2018   Procedure: BILATERAL SALPINGO  OOPHORECTOMY; PERITONEAL WASHINGS;  Surgeon: Anitra Freddy NOVAK, MD;  Location: [facility];  Service: Gynecology;  Laterality:  Bilateral;   TONSILLECTOMY     TONSILLECTOMY AND ADENOIDECTOMY  1953    Current Outpatient Medications  Medication Sig Dispense Refill   Acetaminophen  500 MG capsule Take 1,000 mg by mouth daily as needed for pain.     AMBULATORY NON FORMULARY MEDICATION 1 Bag by Other route daily. HERO smart dispenser 1 Bag 0   cyanocobalamin  (VITAMIN B12) 1000 MCG/ML injection INJECT 1ML INTO THE MUSCLE EVERY 30 DAYS 3 mL 3   FLUoxetine  HCl 60 MG TABS Take 1 tablet by mouth daily. 90 tablet 3   losartan -hydrochlorothiazide  (HYZAAR) 50-12.5 MG tablet TAKE 1 TABLET BY MOUTH DAILY 90 tablet 0   Multiple Vitamins-Minerals (VITAMIN D3 COMPLETE PO) Take 500 Units by mouth in the morning, at noon, and at bedtime.     nystatin  (MYCOSTATIN /NYSTOP ) powder APPLY TO AFFECTED AREA(S) 3 TIMES A DAY 60 g 0   risperiDONE  (RISPERDAL ) 0.25 MG tablet Take 1 tablet (0.25 mg total) by mouth 2 (two) times daily.     traZODone  (DESYREL ) 100 MG tablet Take 1 tablet (100 mg total) by mouth at bedtime. 90 tablet 3   No current facility-administered medications for this visit.    Allergies:   Hydroxyzine, Other, and Shrimp [shellfish allergy]   Social History:  The patient  reports that she has never smoked. She has never used smokeless tobacco. She reports that she does not currently use alcohol. She reports that she does not use drugs.   Family History:   family history includes Breast cancer (age of onset: 4) in her cousin; Breast cancer (age of onset: 41) in her mother; Cancer in her father and mother; Diabetes in her brother and mother; Heart disease in her paternal grandfather; Heart disease (age of onset: 88) in her brother; Heart disease (age of onset: 40) in her paternal grandmother; Hypertension in her mother; Lung cancer in her father.    Review of Systems: Review of Systems  Constitutional: Negative.   HENT: Negative.    Respiratory: Negative.    Cardiovascular: Negative.   Gastrointestinal: Negative.    Musculoskeletal: Negative.   Neurological: Negative.   Psychiatric/Behavioral: Negative.    All other systems reviewed and are negative.   PHYSICAL EXAM: VS:  BP (!) 120/58 (BP Location: Left Arm, Patient Position: Sitting, Cuff Size: Normal)   Pulse 71   Ht 5' 6 (1.676 m)   Wt 214 lb (97.1 kg)   SpO2 94%   BMI 34.54 kg/m  , BMI Body mass index is 34.54 kg/m. GEN: Well nourished, well developed, in no acute distress HEENT: normal Neck: no JVD, carotid bruits, or masses Cardiac: RRR; no murmurs, rubs, or gallops,no edema  Respiratory:  clear to auscultation bilaterally, normal work of breathing GI: soft, nontender, nondistended, + BS MS: no deformity or atrophy Skin: warm and dry, no rash Neuro:  Strength and sensation are intact Psych: euthymic mood, full affect  Recent Labs: 05/16/2024: ALT 21; BNP 15.6; BUN 6; Creatinine, Ser 0.77; Hemoglobin 13.8; Platelets 223; Potassium 4.1; Sodium 138    Lipid Panel Lab Results  Component Value Date   CHOL 171 11/05/2023   HDL 57 11/05/2023   LDLCALC 93 11/05/2023   TRIG 115 11/05/2023      Wt Readings from Last 3 Encounters:  07/17/24 214 lb (97.1 kg)  05/30/24 224 lb 3.2 oz (101.7 kg)  05/16/24 230 lb 6.4 oz (104.5 kg)  ASSESSMENT AND PLAN:  Problem List Items Addressed This Visit       Cardiology Problems   Essential hypertension   Relevant Orders   EKG 12-Lead (Completed)   Other hyperlipidemia   Other Visit Diagnoses       Shortness of breath    -  Primary   Relevant Orders   EKG 12-Lead (Completed)     Leg swelling          Shortness of breath/fatigue Recent normal echocardiogram No apparent cardiac pathology Sedentary at baseline, deconditioning contributing Weight appears to be slowly trending downward Denies chest pain concerning for angina No further testing ordered at this time Recommended PT, home exercises with goal to exercise at the [facility] if tolerated  Essential hypertension Blood  pressure is well controlled on today's visit. No changes made to the medications.  Leg edema Likely dependent edema, minimal on today's visit Recommend leg elevation as needed, compression hose as needed Appears euvolemic No elevated pressures on echo  Spinal stenosis Walks with a walker in her house, denies significant pain after prior cortisone injections.  Denies neurologic symptoms such as foot drop.  Discussed concern for neurologic issues with general anesthesia during surgery.  She will discuss with neurology, for now is pursuing medical management  Signed, Velinda Lunger, M.D., Ph.D. [facility] [place], [place] [phone]

## 2024-07-17 ENCOUNTER — Encounter: Payer: Self-pay | Admitting: Cardiovascular Disease

## 2024-07-17 ENCOUNTER — Ambulatory Visit: Attending: Cardiovascular Disease | Admitting: Cardiovascular Disease

## 2024-07-17 VITALS — BP 120/58 | HR 71 | Ht 66.0 in | Wt 214.0 lb

## 2024-07-17 DIAGNOSIS — E7849 Other hyperlipidemia: Secondary | ICD-10-CM | POA: Diagnosis not present

## 2024-07-17 DIAGNOSIS — I10 Essential (primary) hypertension: Secondary | ICD-10-CM | POA: Diagnosis not present

## 2024-07-17 DIAGNOSIS — M7989 Other specified soft tissue disorders: Secondary | ICD-10-CM | POA: Diagnosis not present

## 2024-07-17 DIAGNOSIS — R0602 Shortness of breath: Secondary | ICD-10-CM | POA: Diagnosis not present

## 2024-07-17 NOTE — Patient Instructions (Addendum)
 Medication Instructions:  No changes  If you need a refill on your cardiac medications before your next appointment, please call your pharmacy.   Lab work: No new labs needed  Testing/Procedures: No new testing needed  Follow-Up: At [facility], you and your health needs are our priority.  As part of our continuing mission to provide you with exceptional heart care, we have created designated Provider Care Teams.  These Care Teams include your primary Cardiologist (physician) and Advanced Practice Providers (APPs -  Physician Assistants and Nurse Practitioners) who all work together to provide you with the care you need, when you need it.  You will need a follow up appointment as needed  Providers on your designated Care Team:   [name], NP [name], PA-C [name], [place]  COVID-19 Vaccine Information can be found at: [email or web address] For questions related to vaccine distribution or appointments, please email [email or web address] or call [phone].

## 2024-07-21 ENCOUNTER — Other Ambulatory Visit: Payer: Self-pay | Admitting: Internal Medicine

## 2024-07-25 ENCOUNTER — Ambulatory Visit
Admission: RE | Admit: 2024-07-25 | Discharge: 2024-07-25 | Disposition: A | Discharge status: Home / Self Care | Source: Ambulatory Visit | Attending: Internal Medicine | Admitting: Internal Medicine

## 2024-07-25 DIAGNOSIS — Z79811 Long term (current) use of aromatase inhibitors: Secondary | ICD-10-CM | POA: Diagnosis not present

## 2024-07-25 DIAGNOSIS — Z853 Personal history of malignant neoplasm of breast: Secondary | ICD-10-CM | POA: Diagnosis not present

## 2024-07-25 DIAGNOSIS — Z780 Asymptomatic menopausal state: Secondary | ICD-10-CM | POA: Diagnosis not present

## 2024-07-25 DIAGNOSIS — Z08 Encounter for follow-up examination after completed treatment for malignant neoplasm: Secondary | ICD-10-CM | POA: Insufficient documentation

## 2024-07-25 DIAGNOSIS — Z5181 Encounter for therapeutic drug level monitoring: Secondary | ICD-10-CM | POA: Insufficient documentation

## 2024-07-25 DIAGNOSIS — Z1231 Encounter for screening mammogram for malignant neoplasm of breast: Secondary | ICD-10-CM | POA: Insufficient documentation

## 2024-07-25 DIAGNOSIS — M85851 Other specified disorders of bone density and structure, right thigh: Secondary | ICD-10-CM

## 2024-07-25 DIAGNOSIS — Z13820 Encounter for screening for osteoporosis: Secondary | ICD-10-CM | POA: Insufficient documentation

## 2024-07-25 DIAGNOSIS — M8588 Other specified disorders of bone density and structure, other site: Secondary | ICD-10-CM | POA: Diagnosis not present

## 2024-08-01 ENCOUNTER — Telehealth: Payer: Self-pay

## 2024-08-01 DIAGNOSIS — F3176 Bipolar disorder, in full remission, most recent episode depressed: Secondary | ICD-10-CM

## 2024-08-01 NOTE — Telephone Encounter (Signed)
 pt husband left message that he wanted to know if Kendra [name] could stay off risperidone  .25 until she come into office to see you on 10-6? If not she will need to get a refill

## 2024-08-02 MED ORDER — RISPERIDONE 0.25 MG PO TABS: 0.2500 mg | ORAL_TABLET | Freq: Two times a day (BID) | ORAL | 0 refills | Status: DC

## 2024-08-02 NOTE — Telephone Encounter (Signed)
 I have sent a refill for risperidone .

## 2024-08-02 NOTE — Telephone Encounter (Signed)
 left message with information. left message that any questions to call office back.

## 2024-08-07 ENCOUNTER — Telehealth: Admitting: Psychiatry

## 2024-08-07 ENCOUNTER — Encounter: Payer: Self-pay | Admitting: Psychiatry

## 2024-08-07 DIAGNOSIS — G2401 Drug induced subacute dyskinesia: Secondary | ICD-10-CM

## 2024-08-07 DIAGNOSIS — F5105 Insomnia due to other mental disorder: Secondary | ICD-10-CM

## 2024-08-07 DIAGNOSIS — F3176 Bipolar disorder, in full remission, most recent episode depressed: Secondary | ICD-10-CM | POA: Diagnosis not present

## 2024-08-07 DIAGNOSIS — F411 Generalized anxiety disorder: Secondary | ICD-10-CM | POA: Diagnosis not present

## 2024-08-07 MED ORDER — TRAZODONE HCL 100 MG PO TABS: 100.0000 mg | ORAL_TABLET | Freq: Every day | ORAL | 3 refills | Status: AC

## 2024-08-07 NOTE — Progress Notes (Signed)
 Virtual Visit via Video Note  I connected with Kendra [name] on 08/07/24 at 11:00 AM EDT by a video enabled telemedicine application and verified that I am speaking with the correct person using two identifiers.  Location Provider Location : [facility] Patient Location : Home  Participants: Patient , Spouse,Provider    I discussed the limitations of evaluation and management by telemedicine and the availability of in person appointments. The patient expressed understanding and agreed to proceed.   I discussed the assessment and treatment plan with the patient. The patient was provided an opportunity to ask questions and all were answered. The patient agreed with the plan and demonstrated an understanding of the instructions.   The patient was advised to call back or seek an in-person evaluation if the symptoms worsen or if the condition fails to improve as anticipated.                                                                             BH MD OP Progress Note  08/08/2024 9:22 AM Ataya Murdy  MRN:  [id number]  Chief Complaint:  Chief Complaint  Patient presents with   Follow-up   Manic Behavior   Depression   Medication Refill   Discussed the use of AI scribe software for clinical note transcription with the patient, who gave verbal consent to proceed.  History of Present Illness Kendra [name] is a 78 year old Caucasian female, married, retired, lives in [place], has a history of bipolar disorder, anxiety disorder, insomnia, hyperprolactinemia, history of multifocal stage II breast cancer status postlumpectomy, ovarian cyst removal, bilateral hearing loss was evaluated by telemedicine today.  She is accompanied by her spouse/caregiver, Lamar.  Her spouse reports that her mood has remained stable, with no significant fluctuations or episodes of mania or depression. She describes her mood as pretty much the same and good, without feeling excessively up or down. According  to her spouse, she keeps up with her tasks and maintains a stable appetite. Although she initiates activities less frequently, she tends to participate when offered opportunities. Her spouse observes that she does not enjoy going out to restaurants or art shows, primarily due to mobility challenges.  She and her spouse discuss recent changes in her medication regimen. She previously took risperidone  0.25 mg twice daily, but about 6 weeks ago, she reduced the dose to 0.25 mg once daily. She ran out of risperidone  2 days prior to the visit and has not taken it since. Her spouse reports that since reducing the risperidone  dose, her mouth movements appear less pronounced. She does not notice the mouth movements and states they do not bother her. Her spouse notes that she frequently chews gum, which may mask or distract from the mouth movements. Her spouse confirms she stopped gabapentin  some time ago due to lack of pain and no Parkinson's diagnosis.  Her spouse reports her current medication regimen includes fluoxetine  60 mg and trazodone  100 mg.. Her spouse reports that she sleeps well and has been doing well in the 2 days since stopping risperidone . Her spouse notes she has mild memory changes, such as difficulty recalling the current zip code, but describes them as slight.  She denies any other concerns.  She denies any suicidality, homicidality or perceptual disturbances.  She agrees with her spouse with regards to her mood symptoms and denies any significant concerns since being off of the risperidone .   Visit Diagnosis:    ICD-10-CM   1. Bipolar disorder, in full remission, most recent episode depressed  F31.76     2. GAD (generalized anxiety disorder)  F41.1     3. Insomnia due to mental condition  F51.05 traZODone  (DESYREL ) 100 MG tablet   mood, pain    4. Tardive dyskinesia  G24.01       Past Psychiatric History: I have reviewed past psychiatric history from progress note on 03/01/2019.   Past trials of risperidone , Prozac , Seroquel , Ativan .  Past Medical History:  Past Medical History:  Diagnosis Date   Allergy    Anxiety    Breast cancer (HCC) 03/2018   right breast cancer at 11:00 and 1:00   Bursitis    bilateral hips and knees   Cataract    Complication of anesthesia    Depression    Family history of breast cancer 03/24/2018   Family history of lung cancer 03/24/2018   History of alcohol dependence (HCC) 2007   no ETOH; resolved since 2007   History of hiatal hernia    History of psychosis 2014   due to sleep disturbance   Hypertension    Neuromuscular disorder (HCC) undiagnosed   Personal history of radiation therapy 06/2018-07/2018   right breast ca   PONV (postoperative nausea and vomiting)    Substance abuse (HCC) alcohol; sober over 10 years    Past Surgical History:  Procedure Laterality Date   BREAST BIOPSY Right 03/14/2018   11:00 DCIS and invasive ductal carcinoma   BREAST BIOPSY Right 03/14/2018   1:00 Invasive ductal carcinoma   BREAST LUMPECTOMY Right 04/27/2018   lumpectomy of 11 and 1:00 cancers, clear margins, negative LN   BREAST LUMPECTOMY WITH RADIOACTIVE SEED AND SENTINEL LYMPH NODE BIOPSY Right 04/27/2018   Procedure: RIGHT BREAST LUMPECTOMY WITH RADIOACTIVE SEED X 2 AND RIGHT SENTINEL LYMPH NODE BIOPSY;  Surgeon: Mikell Katz, MD;  Location: [facility];  Service: General;  Laterality: Right;   CATARACT EXTRACTION W/PHACO Left 08/30/2019   Procedure: CATARACT EXTRACTION PHACO AND INTRAOCULAR LENS PLACEMENT (IOC) LEFT panoptix toric  01:20.5  12.7%  10.26;  Surgeon: Mittie Gaskin, MD;  Location: [facility];  Service: Ophthalmology;  Laterality: Left;   CATARACT EXTRACTION W/PHACO Right 09/20/2019   Procedure: CATARACT EXTRACTION PHACO AND INTRAOCULAR LENS PLACEMENT (IOC) RIGHT 5.71  00:36.4  15.7%;  Surgeon: Mittie Gaskin, MD;  Location: [facility];  Service: Ophthalmology;  Laterality:  Right;   EYE SURGERY  cataract removal and lens implant   LAPAROTOMY N/A 02/10/2018   Procedure: EXPLORATORY LAPAROTOMY;  Surgeon: Anitra Freddy NOVAK, MD;  Location: [facility];  Service: Gynecology;  Laterality: N/A;   MASS EXCISION  01/2018   abdominal   OVARIAN CYST REMOVAL     SALPINGOOPHORECTOMY Bilateral 02/10/2018   Procedure: BILATERAL SALPINGO OOPHORECTOMY; PERITONEAL WASHINGS;  Surgeon: Anitra Freddy NOVAK, MD;  Location: [facility];  Service: Gynecology;  Laterality: Bilateral;   TONSILLECTOMY     TONSILLECTOMY AND ADENOIDECTOMY  1953    Family Psychiatric History: I have reviewed family psychiatric history from progress note on 03/01/2019.  Family History:  Family History  Problem Relation Age of Onset   Diabetes Mother    Breast cancer Mother 18   Hypertension Mother    Cancer Mother  Lung cancer Father        asbestos exposure   Cancer Father    Heart disease Brother 31   Diabetes Brother    Breast cancer Cousin 24       paternal cousin   Heart disease Paternal Grandmother 53   Heart disease Paternal Grandfather     Social History: I have reviewed social history from progress note on 03/01/2019. Social History   Socioeconomic History   Marital status: Married    Spouse name: robert   Number of children: 0   Years of education: Not on file   Highest education level: Master's degree (e.g., MA, MS, MEng, MEd, MSW, MBA)  Occupational History   Occupation: retired Insurance underwriter of education  Tobacco Use   Smoking status: Never   Smokeless tobacco: Never  Vaping Use   Vaping status: Never Used  Substance and Sexual Activity   Alcohol use: Not Currently    Comment: alcohol dependence prior to 2007   Drug use: Never   Sexual activity: Not Currently    Partners: Male    Birth control/protection: Surgical  Other Topics Concern   Not on file  Social History Narrative   Not on file   Social Drivers of Health   Financial Resource Strain: Low Risk   (05/29/2024)   Overall Financial Resource Strain (CARDIA)    Difficulty of Paying Living Expenses: Not hard at all  Food Insecurity: No Food Insecurity (05/29/2024)   Hunger Vital Sign    Worried About Running Out of Food in the Last Year: Never true    Ran Out of Food in the Last Year: Never true  Transportation Needs: No Transportation Needs (05/29/2024)   PRAPARE - Administrator, Civil Service (Medical): No    Lack of Transportation (Non-Medical): No  Physical Activity: Inactive (05/29/2024)   Exercise Vital Sign    Days of Exercise per Week: 0 days    Minutes of Exercise per Session: Not on file  Stress: Stress Concern Present (05/29/2024)   [employer] of Occupational Health - Occupational Stress Questionnaire    Feeling of Stress: To some extent  Social Connections: Socially Isolated (05/29/2024)   Social Connection and Isolation Panel    Frequency of Communication with Friends and Family: Never    Frequency of Social Gatherings with Friends and Family: Once a week    Attends Religious Services: Never    Database administrator or Organizations: No    Attends Engineer, structural: Not on file    Marital Status: Married    Allergies:  Allergies  Allergen Reactions   Hydroxyzine Anaphylaxis    Tongue swollen   Other Other (See Comments)    Food poisoning    Shrimp [Shellfish Allergy] Other (See Comments)    Food poisoning     Metabolic Disorder Labs: Lab Results  Component Value Date   HGBA1C 5.9 (H) 11/05/2023   Lab Results  Component Value Date   PROLACTIN 78.3 (H) 01/04/2023   PROLACTIN 70.5 (H) 05/12/2022   Lab Results  Component Value Date   CHOL 171 11/05/2023   TRIG 115 11/05/2023   HDL 57 11/05/2023   CHOLHDL 3.0 11/05/2023   LDLCALC 93 11/05/2023   LDLCALC 113 (H) 11/06/2022   Lab Results  Component Value Date   TSH 1.653 01/04/2023   TSH 2.31 04/13/2019    Therapeutic Level Labs: No results found for: LITHIUM No  results found for: VALPROATE No  results found for: CBMZ  Current Medications: Current Outpatient Medications  Medication Sig Dispense Refill   Acetaminophen  500 MG capsule Take 1,000 mg by mouth daily as needed for pain.     AMBULATORY NON FORMULARY MEDICATION 1 Bag by Other route daily. HERO smart dispenser 1 Bag 0   cyanocobalamin  (VITAMIN B12) 1000 MCG/ML injection INJECT 1ML INTO THE MUSCLE EVERY 30 DAYS 3 mL 3   FLUoxetine  HCl 60 MG TABS Take 1 tablet by mouth daily. 90 tablet 3   losartan -hydrochlorothiazide  (HYZAAR) 50-12.5 MG tablet TAKE 1 TABLET BY MOUTH DAILY 90 tablet 0   Multiple Vitamins-Minerals (VITAMIN D3 COMPLETE PO) Take 500 Units by mouth in the morning, at noon, and at bedtime.     nystatin  (MYCOSTATIN /NYSTOP ) powder APPLY TOPICALLY TO AFFECTED AREAS THREE TIMES DAILY 60 g 0   traZODone  (DESYREL ) 100 MG tablet Take 1 tablet (100 mg total) by mouth at bedtime. 90 tablet 3   No current facility-administered medications for this visit.     Musculoskeletal: Strength & Muscle Tone: UTA Gait & Station: Seated Patient leans: N/A  Psychiatric Specialty Exam: Review of Systems  Psychiatric/Behavioral:  Positive for sleep disturbance.     There were no vitals taken for this visit.There is no height or weight on file to calculate BMI.  General Appearance: Casual  Eye Contact:  Fair  Speech:  Clear and Coherent  Volume:  Normal  Mood:  Euthymic  Affect:  Flat  Thought Process:  Goal Directed and Descriptions of Associations: Intact  Orientation:  Full (Time, Place, and Person)  Thought Content: Logical   Suicidal Thoughts:  No  Homicidal Thoughts:  No  Memory:  Immediate;   Fair Recent;   Fair Remote;   limited  Judgement:  Fair  Insight:  Fair  Psychomotor Activity:  Normal  Concentration:  Concentration: Fair and Attention Span: Fair  Recall:  [employer] of Knowledge: Fair  Language: Fair  Akathisia:  No  Handed:  Right  AIMS (if indicated): not done   Assets:  Communication Skills Desire for Improvement Housing Transportation  ADL's:  Intact within normal limits with support  Cognition: WNL   Sleep:  Overall okay although since she sleeps on a recliner sleep can be interrupted at times   Screenings: AIMS    Flowsheet Row Video Visit from 08/07/2024 in [facility] Office Visit from 01/24/2024 in [facility] Office Visit from 08/10/2023 in [facility] Visit from 04/08/2023 in [facility] Office Visit from 09/04/2022 in [facility]  AIMS Total Score 2 1 0 0 0   GAD-7    Flowsheet Row Office Visit from 05/30/2024 in [facility] Office Visit from 05/16/2024 in [facility] Office Visit from 01/24/2024 in [facility] Office Visit from 11/29/2023 in [facility] Office Visit from 11/05/2023 in [facility]  Total GAD-7 Score 7 12 1 8 12    PHQ2-9    Flowsheet Row Office Visit from 05/30/2024 in [facility] Office Visit from 05/16/2024 in [facility] Office Visit from 01/24/2024 in [facility] Office Visit from 11/29/2023 in [facility] Office Visit from 11/22/2023 in [facility] Total Score 0 2 0  1 1  PHQ-9 Total Score 8 15 7 10  --   Flowsheet Row Video Visit from 08/07/2024 in [facility] Video Visit from 05/04/2024 in [facility] Video Visit from 03/20/2024 in [facility]  C-SSRS RISK CATEGORY No Risk  No Risk No Risk     Assessment and Plan: Kendra [name] is a 78 year old Caucasian female, has a history of bipolar disorder, GAD, MDD, was evaluated by telemedicine today.  Discussed assessment and plan as noted below.  1. Bipolar disorder, in full remission, most recent episode depressed Currently denies any significant mood swings. Discontinue Risperidone  based on patient preference, stopped taking it 2 days ago. I have communicated with staff to contact the pharmacy to cancel the prescription which was sent out last week. Continue Prozac  60 mg daily  2. GAD (generalized anxiety disorder)-stable Denies any significant concerns and reports anxiety symptoms as managed well. Continue Prozac  60 mg daily  3. Insomnia due to mental condition-improving Does have sleep problems due to sleeping on a recliner however reports trazodone  is beneficial. Continue Trazodone  100 mg at bedtime  4. Tardive dyskinesia-chronic Patient recently was seen at [facility] movement disorder clinic.  Patient to continue follow-up with them. Based on review of notes dated 05/31/2024, Dr.Dahlben, TD likely due to her risperidone .  Collateral information obtained from spouse who provided majority of information in session today.  Follow-up Follow-up in clinic in 3 months or sooner if needed. Collaboration of Care: Collaboration of Care: Other reviewed notes per [facility] clinic-Dr.Dahlben as noted above.  Patient/Guardian was advised Release of Information must be obtained prior to any record release in order to collaborate their care with an outside provider. Patient/Guardian was advised if they have not already done so to contact the registration department to sign all necessary forms in order for us  to release information regarding their care.   Consent: Patient/Guardian gives verbal consent for treatment and assignment of benefits for services provided during this visit. Patient/Guardian expressed  understanding and agreed to proceed.  This note was generated in part or whole with voice recognition software. Voice recognition is usually quite accurate but there are transcription errors that can and very often do occur. I apologize for any typographical errors that were not detected and corrected.     Ornella Coderre, MD 08/08/2024, 9:22 AM

## 2024-08-10 ENCOUNTER — Other Ambulatory Visit: Payer: Self-pay | Admitting: Psychiatry

## 2024-08-10 ENCOUNTER — Other Ambulatory Visit: Payer: Self-pay | Admitting: Family Medicine

## 2024-08-10 DIAGNOSIS — H6123 Impacted cerumen, bilateral: Secondary | ICD-10-CM | POA: Diagnosis not present

## 2024-08-10 DIAGNOSIS — H6063 Unspecified chronic otitis externa, bilateral: Secondary | ICD-10-CM | POA: Diagnosis not present

## 2024-08-10 DIAGNOSIS — H903 Sensorineural hearing loss, bilateral: Secondary | ICD-10-CM | POA: Diagnosis not present

## 2024-08-10 DIAGNOSIS — F5105 Insomnia due to other mental disorder: Secondary | ICD-10-CM

## 2024-09-02 ENCOUNTER — Other Ambulatory Visit: Payer: Self-pay | Admitting: Internal Medicine

## 2024-09-04 ENCOUNTER — Encounter: Payer: Self-pay | Admitting: Internal Medicine

## 2024-09-08 ENCOUNTER — Other Ambulatory Visit: Payer: Self-pay | Admitting: Psychiatry

## 2024-09-08 DIAGNOSIS — F411 Generalized anxiety disorder: Secondary | ICD-10-CM

## 2024-09-22 DIAGNOSIS — F3176 Bipolar disorder, in full remission, most recent episode depressed: Secondary | ICD-10-CM

## 2024-09-24 ENCOUNTER — Encounter: Payer: Self-pay | Admitting: Family Medicine

## 2024-09-25 DIAGNOSIS — H10503 Unspecified blepharoconjunctivitis, bilateral: Secondary | ICD-10-CM | POA: Diagnosis not present

## 2024-09-25 NOTE — Telephone Encounter (Signed)
 Contacted patient by phone.  Discussed starting a low dosage of risperidone  back since patient is currently anxious as well.  However per husband he would like to monitor symptoms closely since there are some situational stressors which could be making her anxious.  Encouraged to follow-up with movement disorder clinic for the involuntary movements.  They do have upcoming appointment scheduled.  Patient to call back with any concerns as needed.

## 2024-09-26 ENCOUNTER — Ambulatory Visit: Payer: Self-pay

## 2024-10-02 ENCOUNTER — Other Ambulatory Visit: Payer: Self-pay | Admitting: Internal Medicine

## 2024-10-06 ENCOUNTER — Encounter: Payer: Self-pay | Admitting: Physician Assistant

## 2024-10-06 ENCOUNTER — Ambulatory Visit: Admitting: Physician Assistant

## 2024-10-06 VITALS — BP 118/79 | HR 79 | Temp 98.4°F | Ht 66.0 in | Wt 220.0 lb

## 2024-10-06 DIAGNOSIS — R3989 Other symptoms and signs involving the genitourinary system: Secondary | ICD-10-CM | POA: Diagnosis not present

## 2024-10-06 DIAGNOSIS — R413 Other amnesia: Secondary | ICD-10-CM

## 2024-10-06 DIAGNOSIS — F411 Generalized anxiety disorder: Secondary | ICD-10-CM

## 2024-10-06 DIAGNOSIS — G2401 Drug induced subacute dyskinesia: Secondary | ICD-10-CM

## 2024-10-06 DIAGNOSIS — F3176 Bipolar disorder, in full remission, most recent episode depressed: Secondary | ICD-10-CM

## 2024-10-06 DIAGNOSIS — L853 Xerosis cutis: Secondary | ICD-10-CM

## 2024-10-06 DIAGNOSIS — N319 Neuromuscular dysfunction of bladder, unspecified: Secondary | ICD-10-CM

## 2024-10-06 LAB — POCT URINALYSIS DIPSTICK
Bilirubin, UA: NEGATIVE
Blood, UA: NEGATIVE
Glucose, UA: NEGATIVE
Ketones, UA: NEGATIVE
Nitrite, UA: NEGATIVE
Protein, UA: POSITIVE — AB
Spec Grav, UA: 1.01 (ref 1.010–1.025)
Urobilinogen, UA: 0.2 U/dL
pH, UA: 7 (ref 5.0–8.0)

## 2024-10-06 MED ORDER — RISPERIDONE 0.25 MG PO TABS: 0.2500 mg | ORAL_TABLET | Freq: Every day | ORAL | 0 refills | Status: AC

## 2024-10-06 NOTE — Progress Notes (Unsigned)
 Established patient visit  Patient: Kendra [name]   DOB: 06-21-1946   78 y.o. Female  MRN: [id number] Visit Date: 10/06/2024  Today's healthcare provider: Jolynn Spencer, PA-C   Chief Complaint  Patient presents with   Acute Visit    Patient is here for a possible UTI, low back pain.  Caregiver seems to think that she has one and needed to be checked.  Also husband is stating that she is have more memory loss issues.  Also has some itching on her back and states that she has scratches there may just be dry skin.   Subjective     HPI     Acute Visit    Additional comments: Patient is here for a possible UTI, low back pain.  Caregiver seems to think that she has one and needed to be checked.  Also husband is stating that she is have more memory loss issues.  Also has some itching on her back and states that she has scratches there may just be dry skin.      Last edited by Terrel Powell CROME, CMA on 10/06/2024 11:07 AM.       Discussed the use of AI scribe software for clinical note transcription with the patient, who gave verbal consent to proceed.  History of Present Illness Kendra [name] is a 78 year old female who presents with low back pain and skin dryness.  She has intermittent low back pain that in the past was associated with urinary tract infections per her caregiver. Today she has no back pain, no dysuria, and no difficulty with urination. She is here preventively due to concern about a possible UTI.  She has dry, itchy skin on her back with visible scratch marks. Body oil has helped somewhat.  She has tardive dyskinesia thought to be medication induced by risperidone , which was stopped in September. Since stopping risperidone  0.25 mg daily, her caregiver has noticed increased foot movements, confusion, irritability, and memory loss.  She had a nerve block over a year ago for pain management that has continued to provide good relief. Prior injections only gave short-term  benefit.       05/30/2024   10:40 AM 05/16/2024    9:35 AM 01/24/2024   11:18 AM  Depression screen PHQ 2/9  Decreased Interest 0 1   Down, Depressed, Hopeless 0 1   PHQ - 2 Score 0 2   Altered sleeping 2 2   Tired, decreased energy 3 3   Change in appetite 0 2   Feeling bad or failure about yourself  0 0   Trouble concentrating 0 3   Moving slowly or fidgety/restless 3 3   Suicidal thoughts 0 0   PHQ-9 Score 8  15    Difficult doing work/chores Very difficult Extremely dIfficult      Information is confidential and restricted. Go to Review Flowsheets to unlock data.   Data saved with a previous flowsheet row definition      05/30/2024   10:41 AM 05/16/2024    9:36 AM 01/24/2024   11:19 AM 11/29/2023   11:51 AM  GAD 7 : Generalized Anxiety Score  Nervous, Anxious, on Edge 2 2  2   Control/stop worrying 2 3  2   Worry too much - different things 0 3  2  Trouble relaxing 0 2  1  Restless 3 2  1   Easily annoyed or irritable 0 0  0  Afraid - awful might happen 0 0  0  Total GAD 7 Score 7 12  8   Anxiety Difficulty Very difficult Extremely difficult  Not difficult at all     Information is confidential and restricted. Go to Review Flowsheets to unlock data.    Medications: Outpatient Medications Prior to Visit  Medication Sig   Acetaminophen  500 MG capsule Take 1,000 mg by mouth daily as needed for pain.   AMBULATORY NON FORMULARY MEDICATION 1 Bag by Other route daily. HERO smart dispenser   cyanocobalamin  (VITAMIN B12) 1000 MCG/ML injection INJECT 1ML INTO THE MUSCLE EVERY 30 DAYS   FLUoxetine  HCl 60 MG TABS TAKE 1 TABLET BY MOUTH DAILY   losartan -hydrochlorothiazide  (HYZAAR) 50-12.5 MG tablet TAKE 1 TABLET BY MOUTH DAILY   Multiple Vitamins-Minerals (VITAMIN D3 COMPLETE PO) Take 500 Units by mouth in the morning, at noon, and at bedtime.   nystatin  (MYCOSTATIN /NYSTOP ) powder APPLY TO AFFECTED AREA(S) 3 TIMES A DAY   traZODone  (DESYREL ) 100 MG tablet Take 1 tablet (100 mg  total) by mouth at bedtime.   No facility-administered medications prior to visit.    Review of Systems All negative Except see HPI   {Insert previous labs (optional):23779} {See past labs  Heme  Chem  Endocrine  Serology  Results Review (optional):1}   Objective    BP 118/79 (BP Location: Left Arm, Patient Position: Sitting, Cuff Size: Large)   Pulse 79   Temp 98.4 F (36.9 C) (Oral)   Ht 5' 6 (1.676 m)   Wt 220 lb (99.8 kg)   SpO2 98%   BMI 35.51 kg/m  {Insert last BP/Wt (optional):23777}{See vitals history (optional):1}   Physical Exam Vitals reviewed.  Constitutional:      General: She is not in acute distress.    Appearance: Normal appearance. She is well-developed. She is not diaphoretic.  HENT:     Head: Normocephalic and atraumatic.  Eyes:     General: No scleral icterus.    Conjunctiva/sclera: Conjunctivae normal.  Neck:     Thyroid : No thyromegaly.  Cardiovascular:     Rate and Rhythm: Normal rate and regular rhythm.     Pulses: Normal pulses.     Heart sounds: Normal heart sounds. No murmur heard. Pulmonary:     Effort: Pulmonary effort is normal. No respiratory distress.     Breath sounds: Normal breath sounds. No wheezing, rhonchi or rales.  Musculoskeletal:     Cervical back: Neck supple.     Right lower leg: No edema.     Left lower leg: No edema.  Lymphadenopathy:     Cervical: No cervical adenopathy.  Skin:    General: Skin is warm and dry.     Findings: No rash.  Neurological:     Mental Status: She is alert and oriented to person, place, and time. Mental status is at baseline.  Psychiatric:        Mood and Affect: Mood normal.        Behavior: Behavior normal.      No results found for any visits on 10/06/24.      Assessment & Plan Suspected urinary tract infection Trace blood and leukocytes in urine suggest possible UTI without typical symptoms. - Sent urine sample for microscopy and culture. - Advised increased water   intake. - Recommended daily cranberry juice consumption.  Dry skin of back (xerosis cutis) Current management with body oil appears effective. - Continue use of body oil.  Tardive dyskinesia Possibly induced by risperidone , discontinued in September. Symptoms include increased foot movement, confusion, irritability, and  memory loss. - Discuss potential reintroduction of risperidone  or similar medication with prescriber.  Memory loss Associated with tardive dyskinesia and medication changes. - Proceed with neurology consultation for evaluation.    No orders of the defined types were placed in this encounter.   No follow-ups on file.   The patient was advised to call back or seek an in-person evaluation if the symptoms worsen or if the condition fails to improve as anticipated.  I discussed the assessment and treatment plan with the patient. The patient was provided an opportunity to ask questions and all were answered. The patient agreed with the plan and demonstrated an understanding of the instructions.  I, Novak Stgermaine, PA-C have reviewed all documentation for this visit. The documentation on 10/06/2024  for the exam, diagnosis, procedures, and orders are all accurate and complete.  Jolynn Spencer, [facility], MMS [facility] [phone] (phone) [phone] (fax)  [facility]

## 2024-10-06 NOTE — Addendum Note (Signed)
 Addended byBETHA COBY HEIGHT on: 10/06/2024 12:05 PM   Modules accepted: Orders

## 2024-10-08 DIAGNOSIS — L853 Xerosis cutis: Secondary | ICD-10-CM | POA: Insufficient documentation

## 2024-10-08 LAB — URINALYSIS, MICROSCOPIC ONLY
Bacteria, UA: NONE SEEN
Casts: NONE SEEN /LPF
RBC, Urine: NONE SEEN /HPF (ref 0–2)
WBC, UA: NONE SEEN /HPF (ref 0–5)

## 2024-10-08 LAB — URINE CULTURE

## 2024-10-09 ENCOUNTER — Ambulatory Visit: Payer: Self-pay | Admitting: Physician Assistant

## 2024-10-22 ENCOUNTER — Other Ambulatory Visit: Payer: Self-pay | Admitting: Internal Medicine

## 2024-10-23 ENCOUNTER — Encounter: Payer: Self-pay | Admitting: Internal Medicine

## 2024-11-02 ENCOUNTER — Encounter: Payer: Self-pay | Admitting: Internal Medicine

## 2024-11-06 ENCOUNTER — Other Ambulatory Visit: Payer: Self-pay | Admitting: Family Medicine

## 2024-11-09 ENCOUNTER — Ambulatory Visit: Admitting: Psychiatry

## 2024-11-23 ENCOUNTER — Other Ambulatory Visit: Payer: Self-pay | Admitting: Internal Medicine

## 2024-11-28 ENCOUNTER — Encounter: Payer: Self-pay | Admitting: Family Medicine

## 2024-12-04 ENCOUNTER — Telehealth: Admitting: Family Medicine

## 2024-12-04 ENCOUNTER — Encounter: Payer: Self-pay | Admitting: Family Medicine

## 2024-12-04 DIAGNOSIS — C50211 Malignant neoplasm of upper-inner quadrant of right female breast: Secondary | ICD-10-CM | POA: Diagnosis not present

## 2024-12-04 DIAGNOSIS — G20A1 Parkinson's disease without dyskinesia, without mention of fluctuations: Secondary | ICD-10-CM

## 2024-12-04 DIAGNOSIS — F1021 Alcohol dependence, in remission: Secondary | ICD-10-CM | POA: Diagnosis not present

## 2024-12-04 DIAGNOSIS — F3130 Bipolar disorder, current episode depressed, mild or moderate severity, unspecified: Secondary | ICD-10-CM | POA: Diagnosis not present

## 2024-12-04 DIAGNOSIS — B372 Candidiasis of skin and nail: Secondary | ICD-10-CM

## 2024-12-04 DIAGNOSIS — M48062 Spinal stenosis, lumbar region with neurogenic claudication: Secondary | ICD-10-CM | POA: Diagnosis not present

## 2024-12-04 DIAGNOSIS — I10 Essential (primary) hypertension: Secondary | ICD-10-CM | POA: Diagnosis not present

## 2024-12-04 DIAGNOSIS — E7849 Other hyperlipidemia: Secondary | ICD-10-CM | POA: Diagnosis not present

## 2024-12-04 DIAGNOSIS — R269 Unspecified abnormalities of gait and mobility: Secondary | ICD-10-CM | POA: Diagnosis not present

## 2024-12-04 DIAGNOSIS — R400 Somnolence: Secondary | ICD-10-CM

## 2024-12-04 DIAGNOSIS — R7303 Prediabetes: Secondary | ICD-10-CM | POA: Diagnosis not present

## 2024-12-04 MED ORDER — NYSTATIN 100000 UNIT/GM EX POWD: Freq: Two times a day (BID) | CUTANEOUS | 5 refills | Status: AC

## 2024-12-04 MED ORDER — LOSARTAN POTASSIUM-HCTZ 50-12.5 MG PO TABS: 1.0000 | ORAL_TABLET | Freq: Every day | ORAL | 3 refills | Status: AC

## 2024-12-04 NOTE — Assessment & Plan Note (Signed)
 No recent blood work to assess current status. Labs are due for review. Not current on a statin - Ordered labs to assess cholesterol levels.

## 2024-12-04 NOTE — Assessment & Plan Note (Signed)
 BMI 35.5 and assoc with HTN, HLD, preDM Discussed importance of healthy weight management Discussed diet and exercise

## 2024-12-04 NOTE — Assessment & Plan Note (Signed)
"  Sustained remission.  "

## 2024-12-04 NOTE — Assessment & Plan Note (Signed)
 F/b Psych Mood is stable No manic symptoms No changes to medications

## 2024-12-04 NOTE — Assessment & Plan Note (Signed)
 Blood pressure is well-controlled with current medication regimen. Last recorded blood pressure was 118/79 mmHg. - Continue current antihypertensive medication regimen. - Renewed losartan -hctz prescription - continue at current dose.

## 2024-12-04 NOTE — Assessment & Plan Note (Signed)
 No recent blood work to assess current status. Labs are due for review. - Ordered labs to assess A1c and cholesterol levels. - encourage low carb diet

## 2024-12-04 NOTE — Assessment & Plan Note (Signed)
Patient would benefit from additional help at home with ADLs and IADLs

## 2024-12-04 NOTE — Assessment & Plan Note (Signed)
F/b Oncology

## 2024-12-25 ENCOUNTER — Ambulatory Visit: Admitting: Psychiatry

## 2025-02-27 ENCOUNTER — Other Ambulatory Visit

## 2025-02-27 ENCOUNTER — Ambulatory Visit: Admitting: Nurse Practitioner

## 2025-02-27 ENCOUNTER — Ambulatory Visit
